# Patient Record
Sex: Female | Born: 1955 | Race: Black or African American | Hispanic: No | Marital: Single | Smoking: Current every day smoker
Health system: Southern US, Community
[De-identification: ages and names within clinical notes are randomized; demographics above are authoritative.]

## PROBLEM LIST (undated history)

## (undated) DIAGNOSIS — I10 Essential (primary) hypertension: Secondary | ICD-10-CM

## (undated) DIAGNOSIS — I509 Heart failure, unspecified: Secondary | ICD-10-CM

## (undated) DIAGNOSIS — N289 Disorder of kidney and ureter, unspecified: Secondary | ICD-10-CM

## (undated) DIAGNOSIS — E785 Hyperlipidemia, unspecified: Secondary | ICD-10-CM

## (undated) DIAGNOSIS — U071 COVID-19: Secondary | ICD-10-CM

## (undated) HISTORY — DX: Essential (primary) hypertension: I10

---

## 2004-07-10 ENCOUNTER — Emergency Department: Payer: Self-pay | Admitting: Emergency Medicine

## 2007-03-05 ENCOUNTER — Ambulatory Visit: Payer: Self-pay

## 2009-08-07 ENCOUNTER — Inpatient Hospital Stay: Payer: Self-pay | Admitting: Internal Medicine

## 2010-01-05 ENCOUNTER — Ambulatory Visit: Payer: Self-pay | Admitting: General Practice

## 2012-06-08 ENCOUNTER — Emergency Department: Payer: Self-pay | Admitting: Emergency Medicine

## 2012-06-08 LAB — DRUG SCREEN, URINE
Amphetamines, Ur Screen: NEGATIVE (ref ?–1000)
Barbiturates, Ur Screen: NEGATIVE (ref ?–200)
Cannabinoid 50 Ng, Ur ~~LOC~~: NEGATIVE (ref ?–50)
Cocaine Metabolite,Ur ~~LOC~~: POSITIVE (ref ?–300)
MDMA (Ecstasy)Ur Screen: NEGATIVE (ref ?–500)
Phencyclidine (PCP) Ur S: NEGATIVE (ref ?–25)
Tricyclic, Ur Screen: NEGATIVE (ref ?–1000)

## 2012-06-08 LAB — COMPREHENSIVE METABOLIC PANEL
Alkaline Phosphatase: 146 U/L — ABNORMAL HIGH (ref 50–136)
BUN: 15 mg/dL (ref 7–18)
Bilirubin,Total: 0.5 mg/dL (ref 0.2–1.0)
Co2: 26 mmol/L (ref 21–32)
Creatinine: 1.54 mg/dL — ABNORMAL HIGH (ref 0.60–1.30)
EGFR (African American): 43 — ABNORMAL LOW
EGFR (Non-African Amer.): 37 — ABNORMAL LOW
Glucose: 83 mg/dL (ref 65–99)
Potassium: 3.5 mmol/L (ref 3.5–5.1)
SGOT(AST): 31 U/L (ref 15–37)
SGPT (ALT): 21 U/L (ref 12–78)
Total Protein: 8.1 g/dL (ref 6.4–8.2)

## 2012-06-08 LAB — ACETAMINOPHEN LEVEL: Acetaminophen: <2 ug/mL

## 2012-06-08 LAB — ETHANOL: Ethanol %: <0.003 % (ref 0.000–0.080)

## 2012-06-08 LAB — CBC
HCT: 43.4 % (ref 35.0–47.0)
HGB: 14.2 g/dL (ref 12.0–16.0)
MCH: 27 pg (ref 26.0–34.0)
RBC: 5.26 10*6/uL — ABNORMAL HIGH (ref 3.80–5.20)
RDW: 14.8 % — ABNORMAL HIGH (ref 11.5–14.5)

## 2012-06-08 LAB — SALICYLATE LEVEL: Salicylates, Serum: 3.7 mg/dL — ABNORMAL HIGH

## 2013-11-06 ENCOUNTER — Ambulatory Visit: Payer: Self-pay | Admitting: Gastroenterology

## 2013-12-22 ENCOUNTER — Telehealth: Payer: Self-pay | Admitting: *Deleted

## 2013-12-22 NOTE — Telephone Encounter (Signed)
Pt called states she received a referral from her doctor, please call her for the information.

## 2014-01-05 ENCOUNTER — Ambulatory Visit (INDEPENDENT_AMBULATORY_CARE_PROVIDER_SITE_OTHER): Payer: Medicaid Other

## 2014-01-05 ENCOUNTER — Ambulatory Visit (INDEPENDENT_AMBULATORY_CARE_PROVIDER_SITE_OTHER): Payer: Medicaid Other | Admitting: Podiatry

## 2014-01-05 ENCOUNTER — Encounter: Payer: Self-pay | Admitting: Podiatry

## 2014-01-05 VITALS — BP 127/92 | HR 64 | Resp 16 | Ht 67.0 in | Wt 220.0 lb

## 2014-01-05 DIAGNOSIS — M2040 Other hammer toe(s) (acquired), unspecified foot: Secondary | ICD-10-CM

## 2014-01-05 DIAGNOSIS — L600 Ingrowing nail: Secondary | ICD-10-CM

## 2014-01-05 DIAGNOSIS — B351 Tinea unguium: Secondary | ICD-10-CM

## 2014-01-05 DIAGNOSIS — M79675 Pain in left toe(s): Secondary | ICD-10-CM

## 2014-01-05 DIAGNOSIS — M779 Enthesopathy, unspecified: Secondary | ICD-10-CM

## 2014-01-05 DIAGNOSIS — M79674 Pain in right toe(s): Secondary | ICD-10-CM

## 2014-01-05 NOTE — Progress Notes (Signed)
   Subjective:    Patient ID: Katelyn Lane, female    DOB: October 17, 1955, 58 y.o.   MRN: [id number]  HPI Comments: 58 year old female presents the office with complaints of big toe nail ingrown toenails. She states that the left toenail has come off a couple of times but has since regrown. She states that over the weekend she took a knife and cut out the ingrown portion of the toenail on both of her toes. She denies any recent redness or drainage from the nail sites. She states that she continues have some mild discomfort along the end of the toenail. She states that her right second digit is numb intermittently and will periodically throb and swell. She states his his been ongoing for several months. She denies any history of injury or trauma to the area. No prior treatment for this. No other complaints at this time.     Review of Systems  Constitutional: Positive for unexpected weight change.       Sweating  HENT: Positive for sinus pressure.   Respiratory: Positive for cough.   Skin: Positive for rash.  Neurological: Positive for dizziness.  Hematological: Bruises/bleeds easily.  Psychiatric/Behavioral: Positive for behavioral problems.  All other systems reviewed and are negative.      Objective:   Physical Exam AAO 3, NAD DP/PT pulses palpable bilaterally, CRT less than 3 seconds Protective sensation intact with Simms Weinstein monofilament, vibratory sensation intact, Achilles tendon reflex intact. Hammertoe contractures present bilaterally with corns overlying the PIPJ and DIPJ mostly on the right second digit. There is no tenderness to palpation or pain with vibratory sensation overlying the right second digit or the remaining digits/metatarsals bilaterally. No overlying edema, erythema, increase in warmth. Bilateral hallux nails with slight incurvation of both the medial and lateral nail borders. There is no swelling erythema, edema, increase in warmth. No drainage or purulence  identified. Mild tenderness to palpation over the distal aspect of the medial and lateral nail borders bilaterally. No pain on the proximal nail borders. Bilateral hallux second digit nails hypertrophic, dystrophic, brittle, discolored. No open lesions or pre-ulcerative lesions No pain with calf compression, swelling, warmth, erythema. No areas of pinpoint bony tenderness or pain with vibratory sensation. MMT 5/5, ROM WNL       Assessment & Plan:  58 year old female with bilateral hallux medial and lateral ingrown toenail, symptomatic; likely onychomycosis; hammertoe contractures -X-rays were obtained and reviewed with the patient. -Treatment options were discussed including alternatives, risks, complications. -Debrided bilateral hallux and second digit toenails. Ingrowing portion on the hallux nails were sharply debrided without complications to patient comfort. Discussed with patient if the area becomes symptomatic again or if there is any signs or symptoms of infection with likely need to perform a partial nail avulsion. Patient wishes to hold off at this time. -Right second digit intermittent pain likely due to significant hammertoe contracture this digit. Continue to monitor. Dispensed offloading pad to help protect the area. -Patient was inquiring about possible onychomycosis treatment. Discussed various treatment options. At this time, we'll proceed with fungi nail. -Follow-up as needed. In the meantime, call the office in the questions, concerns, change in symptoms. Follow-up with PCP for other issues mentioned in the review of systems.

## 2014-01-05 NOTE — Patient Instructions (Addendum)
Onychomycosis/Fungal Toenails  WHAT IS IT? An infection that lies within the keratin of your nail plate that is caused by a fungus.  WHY ME? Fungal infections affect all ages, sexes, races, and creeds.  There may be many factors that predispose you to a fungal infection such as age, coexisting medical conditions such as diabetes, or an autoimmune disease; stress, medications, fatigue, genetics, etc.  Bottom line: fungus thrives in a warm, moist environment and your shoes offer such a location.  IS IT CONTAGIOUS? Theoretically, yes.  You do not want to share shoes, nail clippers or files with someone who has fungal toenails.  Walking around barefoot in the same room or sleeping in the same bed is unlikely to transfer the organism.  It is important to realize, however, that fungus can spread easily from one nail to the next on the same foot.  HOW DO WE TREAT THIS?  There are several ways to treat this condition.  Treatment may depend on many factors such as age, medications, pregnancy, liver and kidney conditions, etc.  It is best to ask your doctor which options are available to you.  1. No treatment.   Unlike many other medical concerns, you can live with this condition.  However for many people this can be a painful condition and may lead to ingrown toenails or a bacterial infection.  It is recommended that you keep the nails cut short to help reduce the amount of fungal nail. 2. Topical treatment.  These range from herbal remedies to prescription strength nail lacquers.  About 40-50% effective, topicals require twice daily application for approximately 9 to 12 months or until an entirely new nail has grown out.  The most effective topicals are medical grade medications available through physicians offices. 3. Oral antifungal medications.  With an 80-90% cure rate, the most common oral medication requires 3 to 4 months of therapy and stays in your system for a year as the new nail grows out.  Oral  antifungal medications do require blood work to make sure it is a safe drug for you.  A liver function panel will be performed prior to starting the medication and after the first month of treatment.  It is important to have the blood work performed to avoid any harmful side effects.  In general, this medication safe but blood work is required. 4. Laser Therapy.  This treatment is performed by applying a specialized laser to the affected nail plate.  This therapy is noninvasive, fast, and non-painful.  It is not covered by insurance and is therefore, out of pocket.  The results have been very good with a 80-95% cure rate.  [facility] is the only practice in the area to offer this therapy. Permanent Nail Avulsion.  Removing the entire nail so that a new nail will not grow back. Over the counter treatment for nail fungus is called fungi-nail Ingrown Toenail An ingrown toenail occurs when the sharp edge of a toenail grows into the skin of the groove beside the nail (lateral nail groove), causing pain. Ingrown toenails most commonly occur on the first (big) toe. If left untreated, an ingrown toenail may become inflamed and infected. The infection can even spread throughout the toe. SYMPTOMS  Pain, centered on the nail groove. Tenderness and inflammation. Pus drainage (occasionally). Signs of infection: redness, swelling, pain, warm to the touch. CAUSES  Ingrown toenails are caused when the toenail grows into the neighboring fleshy nail fold. This may be the  result of poor nail trimming or pressure from shoes.  RISK INCREASES WITH:  Curved nail formations. Clipping toenails too far on the sides, which allows tissue to grow over the nail. Poorly fitted shoes, especially those that press down on the toenails. Sports that require sudden stops, causing the toe to jam into the shoe. PREVENTION  Trim toenails properly, making sure the sides are not clipped too short. Wear properly fitted  shoes. Avoid excessive pressure on the toenails. PROGNOSIS  Ingrown toenails are usually curable within 1 week, depending on the presence or absence of an infection.  RELATED COMPLICATIONS  Chronic infection, that cannot be cured without surgery. Spread of infection throughout the toe, or even the bone. TREATMENT  Treatment first involves resting from aggravating activities, wearing shoes that do not place pressure on the toenails, antibiotics (if infected), and soaking the foot in warm water (with or without Epsom salt). After the foot has been soaked, you may be able to gently lift the nail from the fleshy tissue, and place a bit of cotton ball under the nail, easing the pressure. If you have been prescribed antibiotics, expect relief to begin up to 48 hours after taking the medicine. Symptoms usually go away within 1 week. For people who suffer from persistent (chronic) ingrown toenails, surgery may be advised. MEDICATION  If pain medicine is needed, nonsteroidal anti-inflammatory medicines (aspirin and ibuprofen), or other minor pain relievers (acetaminophen), are often advised. Do not take pain medicine for 7 days before surgery. Stronger pain relievers may be prescribed, if your caregiver thinks they are needed. Use only as directed and only as much as you need. Antibiotics may be prescribed to fight infection. Take as directed by your caregiver. Finish the entire prescription, even if you start to feel better before you finish it. SOAKS AND COLD THERAPY  Soak the toe (or whole foot) for 20 minutes, twice a day, in a gallon of warm water. You may add 2 tablespoons of Epsom salts or a mild detergent. Cold treatment (icing) should be applied for 10 to 15 minutes every 2 to 3 hours for inflammation and pain, and immediately after activity that aggravates your symptoms. Use ice packs or an ice massage. SEEK MEDICAL CARE IF:  Symptoms get worse or do not improve in 48 hours, despite  treatment. You develop fever, increased pain and swelling, or signs of infection (pain, redness, tenderness, swelling, warmth) in the toe, after surgery. New, unexplained symptoms develop. (Drugs used in treatment may produce side effects.) Document Released: 12/25/2004 Document Revised: 03/19/2011 Document Reviewed: 04/08/2008 [facility] Patient Information 2015 [place], [place]. This information is not intended to replace advice given to you by your health care provider. Make sure you discuss any questions you have with your health care provider. 5.

## 2015-06-30 ENCOUNTER — Ambulatory Visit: Payer: Medicaid Other | Attending: Family Medicine | Admitting: Physical Therapy

## 2015-08-12 ENCOUNTER — Other Ambulatory Visit: Payer: Self-pay | Admitting: Family Medicine

## 2015-08-12 DIAGNOSIS — Z1231 Encounter for screening mammogram for malignant neoplasm of breast: Secondary | ICD-10-CM

## 2015-09-02 ENCOUNTER — Ambulatory Visit
Admission: RE | Admit: 2015-09-02 | Discharge: 2015-09-02 | Disposition: A | Discharge status: Home / Self Care | Payer: Medicaid Other | Source: Ambulatory Visit | Attending: Family Medicine | Admitting: Family Medicine

## 2015-09-02 ENCOUNTER — Encounter (HOSPITAL_COMMUNITY): Payer: Self-pay

## 2015-09-02 DIAGNOSIS — Z1231 Encounter for screening mammogram for malignant neoplasm of breast: Secondary | ICD-10-CM | POA: Diagnosis not present

## 2016-02-20 ENCOUNTER — Other Ambulatory Visit: Payer: Self-pay | Admitting: Family Medicine

## 2016-02-20 ENCOUNTER — Ambulatory Visit
Admission: RE | Admit: 2016-02-20 | Discharge: 2016-02-20 | Disposition: A | Discharge status: Home / Self Care | Payer: Medicaid Other | Source: Ambulatory Visit | Attending: Family Medicine | Admitting: Family Medicine

## 2016-02-20 DIAGNOSIS — G8929 Other chronic pain: Secondary | ICD-10-CM | POA: Diagnosis not present

## 2016-02-20 DIAGNOSIS — M25511 Pain in right shoulder: Secondary | ICD-10-CM | POA: Diagnosis present

## 2016-10-04 ENCOUNTER — Other Ambulatory Visit: Payer: Self-pay | Admitting: Family Medicine

## 2016-10-04 DIAGNOSIS — L299 Pruritus, unspecified: Secondary | ICD-10-CM

## 2016-10-18 ENCOUNTER — Other Ambulatory Visit
Admission: RE | Admit: 2016-10-18 | Discharge: 2016-10-18 | Disposition: A | Discharge status: Home / Self Care | Payer: Medicaid Other | Source: Ambulatory Visit | Attending: Family Medicine | Admitting: Family Medicine

## 2016-10-18 ENCOUNTER — Ambulatory Visit
Admission: RE | Admit: 2016-10-18 | Discharge: 2016-10-18 | Disposition: A | Discharge status: Home / Self Care | Payer: Medicaid Other | Source: Ambulatory Visit | Attending: Family Medicine | Admitting: Family Medicine

## 2016-10-18 DIAGNOSIS — J449 Chronic obstructive pulmonary disease, unspecified: Secondary | ICD-10-CM | POA: Diagnosis not present

## 2016-10-18 DIAGNOSIS — J9811 Atelectasis: Secondary | ICD-10-CM | POA: Insufficient documentation

## 2016-10-18 DIAGNOSIS — L299 Pruritus, unspecified: Secondary | ICD-10-CM | POA: Diagnosis present

## 2016-10-18 DIAGNOSIS — I517 Cardiomegaly: Secondary | ICD-10-CM | POA: Insufficient documentation

## 2017-06-18 ENCOUNTER — Other Ambulatory Visit: Payer: Self-pay | Admitting: Family Medicine

## 2017-06-18 DIAGNOSIS — Z1231 Encounter for screening mammogram for malignant neoplasm of breast: Secondary | ICD-10-CM

## 2017-08-01 ENCOUNTER — Ambulatory Visit
Admission: RE | Admit: 2017-08-01 | Discharge: 2017-08-01 | Disposition: A | Discharge status: Home / Self Care | Payer: Medicaid Other | Source: Ambulatory Visit | Attending: Family Medicine | Admitting: Family Medicine

## 2017-08-01 DIAGNOSIS — Z1231 Encounter for screening mammogram for malignant neoplasm of breast: Secondary | ICD-10-CM

## 2018-03-06 ENCOUNTER — Other Ambulatory Visit: Payer: Self-pay | Admitting: Nephrology

## 2018-03-06 DIAGNOSIS — N183 Chronic kidney disease, stage 3 unspecified: Secondary | ICD-10-CM

## 2018-03-25 ENCOUNTER — Other Ambulatory Visit: Payer: Self-pay

## 2018-03-25 ENCOUNTER — Ambulatory Visit
Admission: RE | Admit: 2018-03-25 | Discharge: 2018-03-25 | Disposition: A | Discharge status: Home / Self Care | Payer: Medicaid Other | Source: Ambulatory Visit | Attending: Nephrology | Admitting: Nephrology

## 2018-03-25 DIAGNOSIS — N183 Chronic kidney disease, stage 3 unspecified: Secondary | ICD-10-CM

## 2018-09-11 ENCOUNTER — Other Ambulatory Visit: Payer: Self-pay | Admitting: Family Medicine

## 2018-12-29 ENCOUNTER — Other Ambulatory Visit: Payer: Self-pay

## 2018-12-29 ENCOUNTER — Inpatient Hospital Stay (HOSPITAL_COMMUNITY)
Admit: 2018-12-29 | Discharge: 2018-12-29 | Disposition: A | Discharge status: Home / Self Care | Payer: Medicaid Other | Attending: Family Medicine | Admitting: Family Medicine

## 2018-12-29 ENCOUNTER — Emergency Department: Payer: Medicaid Other

## 2018-12-29 ENCOUNTER — Inpatient Hospital Stay
Admission: EM | Admit: 2018-12-29 | Discharge: 2019-01-01 | DRG: 286 | Disposition: A | Discharge status: Home / Self Care | Payer: Medicaid Other | Attending: Internal Medicine | Admitting: Internal Medicine

## 2018-12-29 DIAGNOSIS — Z79891 Long term (current) use of opiate analgesic: Secondary | ICD-10-CM | POA: Diagnosis not present

## 2018-12-29 DIAGNOSIS — F17210 Nicotine dependence, cigarettes, uncomplicated: Secondary | ICD-10-CM | POA: Diagnosis present

## 2018-12-29 DIAGNOSIS — Z880 Allergy status to penicillin: Secondary | ICD-10-CM

## 2018-12-29 DIAGNOSIS — Z20828 Contact with and (suspected) exposure to other viral communicable diseases: Secondary | ICD-10-CM | POA: Diagnosis present

## 2018-12-29 DIAGNOSIS — F329 Major depressive disorder, single episode, unspecified: Secondary | ICD-10-CM | POA: Diagnosis present

## 2018-12-29 DIAGNOSIS — R0602 Shortness of breath: Secondary | ICD-10-CM

## 2018-12-29 DIAGNOSIS — I420 Dilated cardiomyopathy: Secondary | ICD-10-CM | POA: Diagnosis present

## 2018-12-29 DIAGNOSIS — I248 Other forms of acute ischemic heart disease: Secondary | ICD-10-CM | POA: Diagnosis present

## 2018-12-29 DIAGNOSIS — E876 Hypokalemia: Secondary | ICD-10-CM | POA: Diagnosis present

## 2018-12-29 DIAGNOSIS — F32A Depression, unspecified: Secondary | ICD-10-CM | POA: Diagnosis present

## 2018-12-29 DIAGNOSIS — I5021 Acute systolic (congestive) heart failure: Secondary | ICD-10-CM

## 2018-12-29 DIAGNOSIS — I5031 Acute diastolic (congestive) heart failure: Secondary | ICD-10-CM

## 2018-12-29 DIAGNOSIS — I5020 Unspecified systolic (congestive) heart failure: Secondary | ICD-10-CM

## 2018-12-29 DIAGNOSIS — R0789 Other chest pain: Secondary | ICD-10-CM | POA: Diagnosis present

## 2018-12-29 DIAGNOSIS — I160 Hypertensive urgency: Secondary | ICD-10-CM | POA: Diagnosis present

## 2018-12-29 DIAGNOSIS — I5041 Acute combined systolic (congestive) and diastolic (congestive) heart failure: Secondary | ICD-10-CM | POA: Diagnosis present

## 2018-12-29 DIAGNOSIS — I130 Hypertensive heart and chronic kidney disease with heart failure and stage 1 through stage 4 chronic kidney disease, or unspecified chronic kidney disease: Principal | ICD-10-CM | POA: Diagnosis present

## 2018-12-29 DIAGNOSIS — N189 Chronic kidney disease, unspecified: Secondary | ICD-10-CM | POA: Diagnosis not present

## 2018-12-29 DIAGNOSIS — E785 Hyperlipidemia, unspecified: Secondary | ICD-10-CM | POA: Diagnosis present

## 2018-12-29 DIAGNOSIS — N179 Acute kidney failure, unspecified: Secondary | ICD-10-CM | POA: Diagnosis present

## 2018-12-29 DIAGNOSIS — I509 Heart failure, unspecified: Secondary | ICD-10-CM

## 2018-12-29 DIAGNOSIS — Z79899 Other long term (current) drug therapy: Secondary | ICD-10-CM

## 2018-12-29 DIAGNOSIS — N1832 Chronic kidney disease, stage 3b: Secondary | ICD-10-CM | POA: Diagnosis present

## 2018-12-29 DIAGNOSIS — Z888 Allergy status to other drugs, medicaments and biological substances status: Secondary | ICD-10-CM

## 2018-12-29 HISTORY — DX: Hyperlipidemia, unspecified: E78.5

## 2018-12-29 HISTORY — DX: Disorder of kidney and ureter, unspecified: N28.9

## 2018-12-29 LAB — LACTIC ACID, PLASMA
Lactic Acid, Venous: 2 mmol/L (ref 0.5–1.9)
Lactic Acid, Venous: 2 mmol/L (ref 0.5–1.9)

## 2018-12-29 LAB — BASIC METABOLIC PANEL
Anion gap: 6 (ref 5–15)
BUN: 37 mg/dL — ABNORMAL HIGH (ref 8–23)
CO2: 28 mmol/L (ref 22–32)
Calcium: 8.9 mg/dL (ref 8.9–10.3)
Chloride: 105 mmol/L (ref 98–111)
Creatinine, Ser: 1.74 mg/dL — ABNORMAL HIGH (ref 0.44–1.00)
GFR calc Af Amer: 36 mL/min — ABNORMAL LOW (ref >60)
GFR calc non Af Amer: 31 mL/min — ABNORMAL LOW (ref >60)
Glucose, Bld: 157 mg/dL — ABNORMAL HIGH (ref 70–99)
Potassium: 3 mmol/L — ABNORMAL LOW (ref 3.5–5.1)
Sodium: 139 mmol/L (ref 135–145)

## 2018-12-29 LAB — CBC WITH DIFFERENTIAL/PLATELET
Abs Immature Granulocytes: 0.02 10*3/uL (ref 0.00–0.07)
Basophils Absolute: 0.1 10*3/uL (ref 0.0–0.1)
Basophils Relative: 1 %
Eosinophils Absolute: 0.4 10*3/uL (ref 0.0–0.5)
Eosinophils Relative: 5 %
HCT: 42.2 % (ref 36.0–46.0)
Hemoglobin: 12.8 g/dL (ref 12.0–15.0)
Immature Granulocytes: 0 %
Lymphocytes Relative: 27 %
Lymphs Abs: 2 10*3/uL (ref 0.7–4.0)
MCH: 25.8 pg — ABNORMAL LOW (ref 26.0–34.0)
MCHC: 30.3 g/dL (ref 30.0–36.0)
MCV: 84.9 fL (ref 80.0–100.0)
Monocytes Absolute: 0.6 10*3/uL (ref 0.1–1.0)
Monocytes Relative: 8 %
Neutro Abs: 4.3 10*3/uL (ref 1.7–7.7)
Neutrophils Relative %: 59 %
Platelets: 292 10*3/uL (ref 150–400)
RBC: 4.97 MIL/uL (ref 3.87–5.11)
RDW: 15.2 % (ref 11.5–15.5)
WBC: 7.3 10*3/uL (ref 4.0–10.5)
nRBC: 0 % (ref 0.0–0.2)

## 2018-12-29 LAB — ECHOCARDIOGRAM COMPLETE
Height: 67 in
Weight: 3600 oz

## 2018-12-29 LAB — TROPONIN I (HIGH SENSITIVITY)
Troponin I (High Sensitivity): 53 ng/L — ABNORMAL HIGH (ref <18)
Troponin I (High Sensitivity): 55 ng/L — ABNORMAL HIGH (ref <18)
Troponin I (High Sensitivity): 50 ng/L — ABNORMAL HIGH (ref <18)

## 2018-12-29 LAB — BRAIN NATRIURETIC PEPTIDE: B Natriuretic Peptide: 767 pg/mL — ABNORMAL HIGH (ref 0.0–100.0)

## 2018-12-29 LAB — SARS CORONAVIRUS 2 (TAT 6-24 HRS): SARS Coronavirus 2: NEGATIVE

## 2018-12-29 MED ORDER — HYDRALAZINE HCL 50 MG PO TABS
50.0000 mg | ORAL_TABLET | Freq: Four times a day (QID) | ORAL | Status: DC | PRN
  Filled 2018-12-29: qty 1

## 2018-12-29 MED ORDER — METHYLPREDNISOLONE SODIUM SUCC 125 MG IJ SOLR
125.0000 mg | Freq: Once | INTRAMUSCULAR | Status: AC
  Administered 2018-12-29: 125 mg via INTRAVENOUS
  Filled 2018-12-29: qty 2

## 2018-12-29 MED ORDER — LABETALOL HCL 5 MG/ML IV SOLN
20.0000 mg | INTRAVENOUS | Status: DC | PRN
  Filled 2018-12-29: qty 4

## 2018-12-29 MED ORDER — NITROGLYCERIN 0.4 MG/SPRAY TL SOLN: 1.0000 | Status: DC | PRN

## 2018-12-29 MED ORDER — SODIUM CHLORIDE 0.9 % IV SOLN: 250.0000 mL | INTRAVENOUS | Status: DC | PRN

## 2018-12-29 MED ORDER — NITROGLYCERIN 0.4 MG SL SUBL
SUBLINGUAL_TABLET | SUBLINGUAL | Status: AC
  Administered 2018-12-29: 0.4 mg via SUBLINGUAL
  Filled 2018-12-29: qty 3

## 2018-12-29 MED ORDER — ALPRAZOLAM 0.5 MG PO TABS: 0.2500 mg | ORAL_TABLET | Freq: Two times a day (BID) | ORAL | Status: DC | PRN

## 2018-12-29 MED ORDER — ASPIRIN EC 81 MG PO TBEC
81.0000 mg | DELAYED_RELEASE_TABLET | Freq: Every day | ORAL | Status: DC
  Administered 2018-12-29 – 2019-01-01 (×4): 81 mg via ORAL
  Filled 2018-12-29 (×4): qty 1

## 2018-12-29 MED ORDER — DIPHENHYDRAMINE HCL 25 MG PO CAPS: 25.0000 mg | ORAL_CAPSULE | Freq: Four times a day (QID) | ORAL | Status: DC | PRN

## 2018-12-29 MED ORDER — SODIUM CHLORIDE 0.9% FLUSH
3.0000 mL | INTRAVENOUS | Status: DC | PRN
  Administered 2019-01-01: 3 mL via INTRAVENOUS

## 2018-12-29 MED ORDER — FUROSEMIDE 10 MG/ML IJ SOLN: 40.0000 mg | Freq: Two times a day (BID) | INTRAMUSCULAR | Status: DC

## 2018-12-29 MED ORDER — NITROGLYCERIN 0.4 MG SL SUBL
0.4000 mg | SUBLINGUAL_TABLET | SUBLINGUAL | Status: AC | PRN
  Administered 2018-12-29 (×2): 0.4 mg via SUBLINGUAL

## 2018-12-29 MED ORDER — SODIUM CHLORIDE 0.9% FLUSH
3.0000 mL | Freq: Two times a day (BID) | INTRAVENOUS | Status: DC
  Administered 2018-12-29 – 2018-12-31 (×6): 3 mL via INTRAVENOUS

## 2018-12-29 MED ORDER — FUROSEMIDE 10 MG/ML IJ SOLN
40.0000 mg | Freq: Once | INTRAMUSCULAR | Status: AC
  Administered 2018-12-29: 02:00:00 40 mg via INTRAVENOUS
  Filled 2018-12-29: qty 4

## 2018-12-29 MED ORDER — AMLODIPINE BESYLATE 10 MG PO TABS
10.0000 mg | ORAL_TABLET | Freq: Every day | ORAL | Status: DC
  Administered 2018-12-29 – 2018-12-30 (×2): 10 mg via ORAL
  Filled 2018-12-29: qty 1
  Filled 2018-12-29: qty 2

## 2018-12-29 MED ORDER — ENOXAPARIN SODIUM 40 MG/0.4ML ~~LOC~~ SOLN
40.0000 mg | SUBCUTANEOUS | Status: DC
  Administered 2018-12-29 – 2018-12-30 (×2): 40 mg via SUBCUTANEOUS
  Filled 2018-12-29 (×3): qty 0.4

## 2018-12-29 MED ORDER — MORPHINE SULFATE (PF) 2 MG/ML IV SOLN
2.0000 mg | INTRAVENOUS | Status: DC | PRN
  Administered 2018-12-31 – 2019-01-01 (×2): 2 mg via INTRAVENOUS
  Filled 2018-12-29 (×2): qty 1

## 2018-12-29 MED ORDER — ATORVASTATIN CALCIUM 20 MG PO TABS
20.0000 mg | ORAL_TABLET | Freq: Every day | ORAL | Status: DC
  Administered 2018-12-29 – 2018-12-31 (×3): 20 mg via ORAL
  Filled 2018-12-29 (×3): qty 1

## 2018-12-29 MED ORDER — ASPIRIN EC 325 MG PO TBEC: 325.0000 mg | DELAYED_RELEASE_TABLET | Freq: Every day | ORAL | Status: DC

## 2018-12-29 MED ORDER — ONDANSETRON HCL 4 MG/2ML IJ SOLN: 4.0000 mg | Freq: Four times a day (QID) | INTRAMUSCULAR | Status: DC | PRN

## 2018-12-29 MED ORDER — ALBUTEROL SULFATE HFA 108 (90 BASE) MCG/ACT IN AERS
4.0000 | INHALATION_SPRAY | Freq: Once | RESPIRATORY_TRACT | Status: AC
  Administered 2018-12-29: 01:00:00 4 via RESPIRATORY_TRACT
  Filled 2018-12-29: qty 6.7

## 2018-12-29 MED ORDER — ACETAMINOPHEN 325 MG PO TABS
650.0000 mg | ORAL_TABLET | ORAL | Status: DC | PRN
  Administered 2018-12-31 – 2019-01-01 (×2): 650 mg via ORAL
  Filled 2018-12-29 (×2): qty 2

## 2018-12-29 MED ORDER — FUROSEMIDE 10 MG/ML IJ SOLN
40.0000 mg | Freq: Two times a day (BID) | INTRAMUSCULAR | Status: DC
  Administered 2018-12-29 – 2019-01-01 (×7): 40 mg via INTRAVENOUS
  Filled 2018-12-29 (×9): qty 4

## 2018-12-29 MED ORDER — FLUOXETINE HCL 20 MG PO CAPS
40.0000 mg | ORAL_CAPSULE | Freq: Every day | ORAL | Status: DC
  Administered 2018-12-29 – 2019-01-01 (×4): 40 mg via ORAL
  Filled 2018-12-29 (×4): qty 2

## 2018-12-29 MED ORDER — TRAZODONE HCL 50 MG PO TABS
150.0000 mg | ORAL_TABLET | Freq: Every evening | ORAL | Status: DC | PRN
  Filled 2018-12-29: qty 1

## 2018-12-29 MED ORDER — NITROGLYCERIN 0.4 MG SL SUBL: 0.4000 mg | SUBLINGUAL_TABLET | SUBLINGUAL | Status: DC | PRN

## 2018-12-29 MED ORDER — LORATADINE 10 MG PO TABS
10.0000 mg | ORAL_TABLET | Freq: Every day | ORAL | Status: DC
  Administered 2018-12-30 – 2019-01-01 (×3): 10 mg via ORAL
  Filled 2018-12-29 (×5): qty 1

## 2018-12-29 MED ORDER — POTASSIUM CHLORIDE CRYS ER 20 MEQ PO TBCR
40.0000 meq | EXTENDED_RELEASE_TABLET | ORAL | Status: AC
  Administered 2018-12-29 (×2): 40 meq via ORAL
  Filled 2018-12-29 (×2): qty 2

## 2018-12-29 MED ORDER — METOPROLOL TARTRATE 25 MG PO TABS
25.0000 mg | ORAL_TABLET | Freq: Two times a day (BID) | ORAL | Status: DC
  Administered 2018-12-29 – 2018-12-30 (×3): 25 mg via ORAL
  Filled 2018-12-29: qty 0.5
  Filled 2018-12-29 (×3): qty 1

## 2018-12-29 MED ORDER — ZOLPIDEM TARTRATE 5 MG PO TABS: 5.0000 mg | ORAL_TABLET | Freq: Every evening | ORAL | Status: DC | PRN

## 2018-12-29 MED ORDER — ALPRAZOLAM 0.5 MG PO TABS
0.5000 mg | ORAL_TABLET | Freq: Three times a day (TID) | ORAL | Status: DC | PRN
  Administered 2018-12-29 – 2019-01-01 (×4): 0.5 mg via ORAL
  Filled 2018-12-29: qty 2
  Filled 2018-12-29 (×2): qty 1

## 2018-12-29 NOTE — ED Notes (Signed)
meds not verified, msg'd to pharm

## 2018-12-29 NOTE — Consult Note (Signed)
[facility] Consultation Note  Patient ID: Katelyn Lane, MRN: [id number], DOB/AGE: 1955-10-20 63 y.o. Admit date: 12/29/2018   Date of Consult: 12/29/2018 Primary Physician: [name], MD Primary Cardiologist: None  Chief Complaint:  Chief Complaint  Patient presents with  . Shortness of Breath   Reason for Consult: Heart failure  HPI: 63 y.o. female with known hypertension hyperlipidemia who has had new onset of shortness of breath weakness fatigue and PND and orthopnea over the last several days.  She felt like her breathing was significantly abnormal and was unable to continue on.  She was seen in the emergency room for which a chest x-ray has shown that she has some interstitial edema.  She does have a BNP of 767 and a troponin of 53.  She did have chronic kidney disease with a glomerular filtration rate of 36.  With admission to the hospital the patient has had intravenous Lasix which is dramatically helped her shortness of breath.  Additionally inhaler helped as well.  Patient has not had any anginal symptoms or symptoms suggestive of myocardial infarction at this time.  Currently she is hemodynamically stable  Past Medical History:  Diagnosis Date  . Hyperlipidemia   . Renal disorder       Surgical History: No past surgical history on file.   Home Meds: Prior to Admission medications   Medication Sig Start Date End Date Taking? Authorizing Provider  amLODipine (NORVASC) 10 MG tablet Take 10 mg by mouth daily.  03/31/14 11/03/19 Yes [provider]  atorvastatin (LIPITOR) 20 MG tablet Take 20 mg by mouth daily at 6 PM.    Yes [provider]  FLUoxetine (PROZAC) 40 MG capsule Take 40 mg by mouth daily.    Yes [provider]  hydrochlorothiazide (HYDRODIURIL) 25 MG tablet Take 25 mg by mouth daily.    Yes [provider]  traZODone (DESYREL) 100 MG tablet Take 150 mg by mouth at bedtime as needed. 09/08/13  Yes  [provider]    Inpatient Medications:  . amLODipine  10 mg Oral Daily  . aspirin EC  81 mg Oral Daily  . atorvastatin  20 mg Oral q1800  . enoxaparin (LOVENOX) injection  40 mg Subcutaneous Q24H  . FLUoxetine  40 mg Oral Daily  . furosemide  40 mg Intravenous Q12H  . sodium chloride flush  3 mL Intravenous Q12H   . sodium chloride      Allergies:  Allergies  Allergen Reactions  . Penicillins Hives, Other (See Comments) and Rash  . Ace Inhibitors Nausea And Vomiting    Social History   Socioeconomic History  . Marital status: Single    Spouse name: Not on file  . Number of children: Not on file  . Years of education: Not on file  . Highest education level: Not on file  Occupational History  . Not on file  Tobacco Use  . Smoking status: Current Every Day Smoker  . Smokeless tobacco: Never Used  Substance and Sexual Activity  . Alcohol use: No    Alcohol/week: 0.0 standard drinks  . Drug use: Not on file  . Sexual activity: Not on file  Other Topics Concern  . Not on file  Social History Narrative  . Not on file   Social Determinants of Health   Financial Resource Strain:   . Difficulty of Paying Living Expenses: Not on file  Food Insecurity:   . Worried About Charity fundraiser in  the Last Year: Not on file  . Ran Out of Food in the Last Year: Not on file  Transportation Needs:   . Lack of Transportation (Medical): Not on file  . Lack of Transportation (Non-Medical): Not on file  Physical Activity:   . Days of Exercise per Week: Not on file  . Minutes of Exercise per Session: Not on file  Stress:   . Feeling of Stress : Not on file  Social Connections:   . Frequency of Communication with Friends and Family: Not on file  . Frequency of Social Gatherings with Friends and Family: Not on file  . Attends Religious Services: Not on file  . Active Member of Clubs or Organizations: Not on file  . Attends Archivist Meetings: Not on file   . Marital Status: Not on file  Intimate Partner Violence:   . Fear of Current or Ex-Partner: Not on file  . Emotionally Abused: Not on file  . Physically Abused: Not on file  . Sexually Abused: Not on file     Family History  Problem Relation Age of Onset  . Breast cancer Neg Hx      Review of Systems Positive for shortness of breath Negative for: General:  chills, fever, night sweats or weight changes.  Cardiovascular: Positive for PND orthopnea negative for syncope dizziness  Dermatological skin lesions rashes Respiratory: Cough congestion Urologic: Frequent urination urination at night and hematuria Abdominal: negative for nausea, vomiting, diarrhea, bright red blood per rectum, melena, or hematemesis Neurologic: negative for visual changes, and/or hearing changes  All other systems reviewed and are otherwise negative except as noted above.  Labs: No results for input(s): CKTOTAL, CKMB, TROPONINI in the last 72 hours. Lab Results  Component Value Date   WBC 7.3 12/29/2018   HGB 12.8 12/29/2018   HCT 42.2 12/29/2018   MCV 84.9 12/29/2018   PLT 292 12/29/2018    Recent Labs  Lab 12/29/18 0057  NA 139  K 3.0*  CL 105  CO2 28  BUN 37*  CREATININE 1.74*  CALCIUM 8.9  GLUCOSE 157*   No results found for: CHOL, HDL, LDLCALC, TRIG No results found for: DDIMER  Radiology/Studies:  DG Chest Portable 1 View  Result Date: 12/29/2018 CLINICAL DATA:  Shortness of breath EXAM: PORTABLE CHEST 1 VIEW COMPARISON:  10/18/2016 FINDINGS: Mild cardiomegaly. There is mild interstitial pulmonary edema. No focal consolidation. No pneumothorax or sizable pleural effusion. IMPRESSION: Cardiomegaly with mild interstitial pulmonary edema. Electronically Signed   By: Ulyses Jarred M.D.   On: 12/29/2018 01:27    EKG: Normal sinus rhythm with frequent preventricular contractions with left axis deviation and right bundle branch block  Weights: Filed Weights   12/29/18 0117  Weight:  102.1 kg     Physical Exam: Blood pressure (!) 134/93, pulse (!) 40, resp. rate (!) 23, height 5\' 7"  (1.702 m), weight 102.1 kg, SpO2 91 %. Body mass index is 35.24 kg/m. General: Well developed, well nourished, in no acute distress. Head eyes ears nose throat: Normocephalic, atraumatic, sclera non-icteric, no xanthomas, nares are without discharge. No apparent thyromegaly and/or mass  Lungs: Normal respiratory effort.  no wheezes, few basilar rales, no rhonchi.  Heart: RRR with normal S1 S2. no murmur gallop, no rub, PMI is normal size and placement, carotid upstroke normal without bruit, jugular venous pressure is normal Abdomen: Soft, non-tender, non-distended with normoactive bowel sounds. No hepatomegaly. No rebound/guarding. No obvious abdominal masses. Abdominal aorta is normal size without bruit  Extremities: Trace edema. no cyanosis, no clubbing, no ulcers  Peripheral : 2+ bilateral upper extremity pulses, 2+ bilateral femoral pulses, 2+ bilateral dorsal pedal pulse Neuro: Alert and oriented. No facial asymmetry. No focal deficit. Moves all extremities spontaneously. Musculoskeletal: Normal muscle tone without kyphosis Psych:  Responds to questions appropriately with a normal affect.    Assessment: 63 year old female with hypertension hyperlipidemia and new onset congestive heart failure with pulmonary edema and elevated troponin consistent with demand ischemia without evidence of myocardial infarction  Plan: 1.  Intravenous Lasix for pulmonary edema and acute systolic dysfunction congestive heart failure 2.  Continue hypertension control with current medical regimen without change 3.  Other inhalers for possible COPD 4.  Echocardiogram for LV systolic dysfunction valvular heart disease contributing to above 5.  Further treatment options after above  Signed, Corey Skains M.D. [facility] 12/29/2018, 8:23 AM

## 2018-12-29 NOTE — ED Provider Notes (Addendum)
[facility] Emergency Department Provider Note   ____________________________________________   First MD Initiated Contact with Patient 12/29/18 0040     (approximate)  I have reviewed the triage vital signs and the nursing notes.   HISTORY  Chief Complaint Shortness of Breath    HPI Katelyn Lane is a 63 y.o. female with past medical history of CKD and hyperlipidemia who presents to the ED complaining of shortness of breath.  History is limited due to patient's difficulty breathing as she is only able to speak single words at a time.  She reports feeling short of breath for the past couple of days.  She reports feeling feverish but has not taken her temperature.  She denies any associated pain in her chest.  She admits to smoking cigarettes, but denies any history of COPD, also denies any history of CHF.  She did not take anything for symptoms prior to arrival.  She is not aware of any sick contacts.        Past Medical History:  Diagnosis Date  . Hyperlipidemia   . Renal disorder     Patient Active Problem List   Diagnosis Date Noted  . Acute CHF (congestive heart failure) ([place]) 12/29/2018    No past surgical history on file.  Prior to Admission medications   Medication Sig Start Date End Date Taking? Authorizing Provider  amLODipine (NORVASC) 10 MG tablet Take 10 mg by mouth daily.  03/31/14 11/03/19 Yes [provider]  atorvastatin (LIPITOR) 20 MG tablet Take 20 mg by mouth daily at 6 PM.    Yes [provider]  FLUoxetine (PROZAC) 40 MG capsule Take 40 mg by mouth daily.    Yes [provider]  hydrochlorothiazide (HYDRODIURIL) 25 MG tablet Take 25 mg by mouth daily.    Yes [provider]  traZODone (DESYREL) 100 MG tablet Take 150 mg by mouth at bedtime as needed. 09/08/13  Yes [provider]    Allergies Penicillins and Ace inhibitors  Family History  Problem Relation Age of Onset  .  Breast cancer Neg Hx     Social History Social History   Tobacco Use  . Smoking status: Current Every Day Smoker  . Smokeless tobacco: Never Used  Substance Use Topics  . Alcohol use: No    Alcohol/week: 0.0 standard drinks  . Drug use: Not on file    Review of Systems  Constitutional: No fever/chills Eyes: No visual changes. ENT: No sore throat. Cardiovascular: Denies chest pain. Respiratory: Positive for shortness of breath. Gastrointestinal: No abdominal pain.  No nausea, no vomiting.  No diarrhea.  No constipation. Genitourinary: Negative for dysuria. Musculoskeletal: Negative for back pain. Skin: Negative for rash. Neurological: Negative for headaches, focal weakness or numbness.  ____________________________________________   PHYSICAL EXAM:  VITAL SIGNS: ED Triage Vitals [12/29/18 0040]  Enc Vitals Group     BP (!) 138/95     Pulse Rate 91     Resp (!) 26     Temp      Temp src      SpO2 94 %     Weight      Height      Head Circumference      Peak Flow      Pain Score      Pain Loc      Pain Edu?      Excl. in [place]?     Constitutional: Alert and oriented. Eyes: Conjunctivae are normal. Head:  Atraumatic. Nose: No congestion/rhinnorhea. Mouth/Throat: Mucous membranes are moist. Neck: Normal ROM Cardiovascular: Normal rate, regular rhythm. Grossly normal heart sounds. Respiratory: Tachypneic, only able to speak single words..  No retractions. Lungs with expiratory wheezing and crackles. Gastrointestinal: Soft and nontender. No distention. Genitourinary: deferred Musculoskeletal: No lower extremity tenderness nor edema. Neurologic:  Normal speech and language. No gross focal neurologic deficits are appreciated. Skin:  Skin is warm, dry and intact. No rash noted. Psychiatric: Mood and affect are normal. Speech and behavior are normal.  ____________________________________________   LABS (all labs ordered are listed, but only abnormal results are  displayed)  Labs Reviewed  BRAIN NATRIURETIC PEPTIDE - Abnormal; Notable for the following components:      Result Value   B Natriuretic Peptide 767.0 (*)    All other components within normal limits  BASIC METABOLIC PANEL - Abnormal; Notable for the following components:   Potassium 3.0 (*)    Glucose, Bld 157 (*)    BUN 37 (*)    Creatinine, Ser 1.74 (*)    GFR calc non Af Amer 31 (*)    GFR calc Af Amer 36 (*)    All other components within normal limits  CBC WITH DIFFERENTIAL/PLATELET - Abnormal; Notable for the following components:   MCH 25.8 (*)    All other components within normal limits  LACTIC ACID, PLASMA - Abnormal; Notable for the following components:   Lactic Acid, Venous 2.0 (*)    All other components within normal limits  LACTIC ACID, PLASMA - Abnormal; Notable for the following components:   Lactic Acid, Venous 2.0 (*)    All other components within normal limits  TROPONIN I (HIGH SENSITIVITY) - Abnormal; Notable for the following components:   Troponin I (High Sensitivity) 53 (*)    All other components within normal limits  TROPONIN I (HIGH SENSITIVITY) - Abnormal; Notable for the following components:   Troponin I (High Sensitivity) 55 (*)    All other components within normal limits  TROPONIN I (HIGH SENSITIVITY) - Abnormal; Notable for the following components:   Troponin I (High Sensitivity) 50 (*)    All other components within normal limits  SARS CORONAVIRUS 2 (TAT 6-24 HRS)  CBC WITH DIFFERENTIAL/PLATELET  POC SARS CORONAVIRUS 2 AG -  ED   ____________________________________________  EKG  ED ECG REPORT I, Blake Divine, the attending physician, personally viewed and interpreted this ECG.   Date: 12/29/2018  EKG Time: 00:43  Rate: 93  Rhythm: normal sinus rhythm, frequent PVC's noted  Axis: LAD  Intervals:nonspecific intraventricular conduction delay  ST&T Change: None  ED ECG REPORT I, Blake Divine, the attending physician,  personally viewed and interpreted this ECG.   Date: 12/29/2018  EKG Time: 00:48  Rate: 81  Rhythm: normal sinus rhythm  Axis: LAD  Intervals:nonspecific intraventricular conduction delay  ST&T Change: None    PROCEDURES  Procedure(s) performed (including Critical Care):  .Critical Care Performed by: Blake Divine, MD Authorized by: Blake Divine, MD   Critical care provider statement:    Critical care time (minutes):  45   Critical care was necessary to treat or prevent imminent or life-threatening deterioration of the following conditions:  Respiratory failure   Critical care was time spent personally by me on the following activities:  Discussions with consultants, evaluation of patient's response to treatment, examination of patient, ordering and performing treatments and interventions, ordering and review of laboratory studies, ordering and review of radiographic studies, pulse oximetry, re-evaluation of patient's condition, obtaining  history from patient or surrogate and review of old charts   I assumed direction of critical care for this patient from another provider in my specialty: no       ____________________________________________   INITIAL IMPRESSION / ASSESSMENT AND PLAN / ED COURSE       63 year old female presents to the ED with 24 hours of worsening shortness of breath, denies associated chest pain.  She arrived in some respiratory distress after bringing herself to the ED, tachypneic and only able to speak in single words at a time.  She was noted to have both wheezing and crackles on exam, although no signs of fluid overload and patient denied any history of CHF or COPD.  She was treated with albuterol and steroids and had a rapid improvement, subsequently able to speak in full sentences with improving tachypnea.  EKG showed frequent PVCs with nonspecific intraventricular conduction delay, but no obvious ischemic changes.  She did later began to complain of  tightness in her chest and troponin was elevated to 53.  Repeat EKG was performed but did not show any ischemic changes.  Chest x-ray did show cardiomegaly with pulmonary vascular congestion, concerning for new onset CHF.  Lasix was ordered and patient began to diurese with improvement in her chest tightness, also given doses of sublingual nitro.  Plan to admit for further work-up of her new onset of heart failure as well as difficulty breathing.      ____________________________________________   FINAL CLINICAL IMPRESSION(S) / ED DIAGNOSES  Final diagnoses:  Shortness of breath  New onset of congestive heart failure ([place])  Chest tightness     ED Discharge Orders    None       Note:  This document was prepared using Dragon voice recognition software and may include unintentional dictation errors.   Blake Divine, MD 12/29/18 [id number]    Blake Divine, MD 01/18/19 [id number]

## 2018-12-29 NOTE — Progress Notes (Signed)
*  PRELIMINARY RESULTS* Echocardiogram 2D Echocardiogram has been performed.  Katelyn Lane 12/29/2018, 1:44 PM

## 2018-12-29 NOTE — ED Notes (Signed)
Pt reports CP pain at 7/10

## 2018-12-29 NOTE — ED Notes (Signed)
Patient given breakfast tray. Sitting up in bed eating at this time.

## 2018-12-29 NOTE — ED Notes (Signed)
Pt assisted on to bedpan, reports can't go in the [facility]

## 2018-12-29 NOTE — H&P (Signed)
[facility] NAME: Katelyn Lane    MR#:  [id number]  DATE OF BIRTH:  12-May-1955  DATE OF ADMISSION:  12/29/2018  PRIMARY CARE PHYSICIAN: Alene Mires Elyse Jarvis, MD   REQUESTING/REFERRING PHYSICIAN: Blake Divine, MD  CHIEF COMPLAINT:   Chief Complaint  Patient presents with  . Shortness of Breath    HISTORY OF PRESENT ILLNESS:  Katelyn Lane  is a 63 y.o. African-American female with a known history of hypertension, dyslipidemia, depression and tobacco abuse, presented to the emergency room, with acute onset of worsening dyspnea over the last couple days with associated dry cough and occasional wheezing.  She admitted to fever and chills with associated vomiting without abdominal pain.  She denied any sick exposure to COVID-19.  No lower extremity edema.  She admits to two-pillow orthopnea and paroxysmal nocturnal dyspnea.  She has been having chest tightness graded 8/10 in severity with no radiation.  She also had blurred vision without headache or dizziness or paresthesias or focal muscle weakness.  No dysuria, oliguria or hematuria or flank pain  Upon presentation to the emergency room, blood pressure was 138/95 and later 161/106 with respiratory to 26 and pulse symmetry of 91% on room air.  Labs revealed hypokalemia of 3 and a BUN of 37 creatinine 1.74 compared to 15 and 1.54 in 2014 though.  BNP is 767 and high-sensitivity troponin I is 53.  Lactic acid was 2.  Portable chest ray showed cardiomegaly with mild interstitial pulmonary edema.  EKG showed normal sinus rhythm with rate of 69 with PACs and left atrial enlargement and T wave inversion laterally.  The patient was given 40 mg of IV Lasix for passive albuterol, 125 mg IV Solu-Medrol and sublingual nitroglycerin.  She will be admitted to telemetry bed for further evaluation and management PAST MEDICAL HISTORY:   Past Medical History:  Diagnosis Date  . Hyperlipidemia   . Renal disorder    Hypertension Depression Tobacco abuse PAST SURGICAL HISTORY:  No past surgical history on file.  No reported surgeries.  SOCIAL HISTORY:   Social History   Tobacco Use  . Smoking status: Current Every Day Smoker  . Smokeless tobacco: Never Used  Substance Use Topics  . Alcohol use: No    Alcohol/week: 0.0 standard drinks    FAMILY HISTORY:   Family History  Problem Relation Age of Onset  . Breast cancer Neg Hx     DRUG ALLERGIES:   Allergies  Allergen Reactions  . Penicillins Hives, Other (See Comments) and Rash  . Ace Inhibitors Nausea And Vomiting    REVIEW OF SYSTEMS:   ROS As per history of present illness. All pertinent systems were reviewed above. Constitutional,  HEENT, cardiovascular, respiratory, GI, GU, musculoskeletal, neuro, psychiatric, endocrine,  integumentary and hematologic systems were reviewed and are otherwise  negative/unremarkable except for positive findings mentioned above in the HPI.   MEDICATIONS AT HOME:   Prior to Admission medications   Medication Sig Start Date End Date Taking? Authorizing Provider  amLODipine (NORVASC) 10 MG tablet Take 10 mg by mouth daily.  03/31/14 11/03/19 Yes [provider]  atorvastatin (LIPITOR) 20 MG tablet Take 20 mg by mouth daily at 6 PM.    Yes [provider]  FLUoxetine (PROZAC) 40 MG capsule Take 40 mg by mouth daily.    Yes [provider]  hydrochlorothiazide (HYDRODIURIL) 25 MG tablet Take 25 mg by mouth daily.    Yes [provider]  traZODone (DESYREL) 100 MG tablet Take 150 mg by mouth at bedtime as needed. 09/08/13  Yes [provider]      VITAL SIGNS:  Blood pressure (!) 134/93, pulse (!) 40, resp. rate (!) 23, height 5\' 7"  (1.702 m), weight 102.1 kg, SpO2 91 %.  PHYSICAL EXAMINATION:  Physical Exam  GENERAL:  63 y.o.-year-old African-American female patient lying in the bed with no acute distress.  EYES: Pupils equal, round, reactive to  light and accommodation. No scleral icterus. Extraocular muscles intact.  HEENT: Head atraumatic, normocephalic. Oropharynx and nasopharynx clear.  NECK:  Supple, no jugular venous distention. No thyroid enlargement, no tenderness.  LUNGS: Normal breath sounds bilaterally, no wheezing, rales,rhonchi or crepitation. No use of accessory muscles of respiration.  CARDIOVASCULAR: Regular rate and rhythm, S1, S2 normal. No murmurs, rubs, or gallops.  ABDOMEN: Soft, nondistended, nontender. Bowel sounds present. No organomegaly or mass.  EXTREMITIES: No pedal edema, cyanosis, or clubbing.  NEUROLOGIC: Cranial nerves II through XII are intact. Muscle strength 5/5 in all extremities. Sensation intact. Gait not checked.  PSYCHIATRIC: The patient is alert and oriented x 3.  Normal affect and good eye contact. SKIN: No obvious rash, lesion, or ulcer.   LABORATORY PANEL:   CBC Recent Labs  Lab 12/29/18 0057  WBC 7.3  HGB 12.8  HCT 42.2  PLT 292   ------------------------------------------------------------------------------------------------------------------  Chemistries  Recent Labs  Lab 12/29/18 0057  NA 139  K 3.0*  CL 105  CO2 28  GLUCOSE 157*  BUN 37*  CREATININE 1.74*  CALCIUM 8.9   ------------------------------------------------------------------------------------------------------------------  Cardiac Enzymes No results for input(s): TROPONINI in the last 168 hours. ------------------------------------------------------------------------------------------------------------------  RADIOLOGY:  DG Chest Portable 1 View  Result Date: 12/29/2018 CLINICAL DATA:  Shortness of breath EXAM: PORTABLE CHEST 1 VIEW COMPARISON:  10/18/2016 FINDINGS: Mild cardiomegaly. There is mild interstitial pulmonary edema. No focal consolidation. No pneumothorax or sizable pleural effusion. IMPRESSION: Cardiomegaly with mild interstitial pulmonary edema. Electronically Signed   By: Ulyses Jarred  M.D.   On: 12/29/2018 01:27      IMPRESSION AND PLAN:   1.  Acute heart failure of new onset, likely diastolic with preserved ejection fraction.  The patient will be admitted to a telemetry bed.  We will continue diuresis with 40 mg of IV Lasix twice daily.  Will obtain a 2D echo and a cardiology consultation by [facility] clinic.  I notified Dr. Nehemiah Massed about the patient.  We will follow serial troponin I.  2.  Chest tightness/pain.  The patient will be followed with serial troponin I and EKG.  A 2D echo and a cardiology consultation will be obtained as mentioned above for further cardiac risk stratification.  The patient will be placed on aspirin as well as para insulin nitroglycerin and morphine sulfate for pain.  3.  Hypertensive urgency.  The patient will be placed on her amlodipine and will add as needed IV labetalol and hydralazine.  4.  Acute kidney injury superimposed on stage IIIb chronic kidney disease.  This could be prerenal due to acute heart failure.  Will monitor renal functions with diuresis.  We will hold irbesartan and HCTZ for now.  5.  Dyslipidemia.  Continue statin therapy.  6.  Depression.  We will continue Prozac and trazodone.  7.  DVT prophylaxis.  Subcutaneous Lovenox.   All the records are reviewed and case discussed with ED provider. The plan of care was discussed in details with the patient (and family). I answered all questions.  The patient agreed to proceed with the above mentioned plan. Further management will depend upon hospital course.   CODE STATUS: I discussed the CODE STATUS with the patient and she desires to be  full code.  TOTAL TIME TAKING CARE OF THIS PATIENT: 55 minutes.    Christel Mormon M.D on 12/29/2018 at 4:50 AM  [facility]   From 7 PM-7 AM, contact night-coverage [email or web address]  CC: Primary care physician; [place], Elyse Jarvis, MD   Note: This dictation was prepared with Dragon dictation along with smaller phrase  technology. Any transcriptional errors that result from this process are unintentional.

## 2018-12-29 NOTE — TOC Initial Note (Signed)
Transition of Care [facility]) - Initial/Assessment Note    Patient Details  Name: Katelyn Lane MRN: [id number] Date of Birth: 1955-05-08  Transition of Care [facility]) CM/SW Contact:    Anselm Pancoast, RN Phone Number: 12/29/2018, 12:22 PM  Clinical Narrative:                 63 year old admitted with shortness of breath. Patient requested to see case manager and expressed concerns regarding cost of hospitalization. CM confirmed patient still had Medicaid and reviewed coverage with patient. Patient expressed a request for financial assistance to cover Christmas gifts. CM advised not aware of any assistance available for that at this time.  Expected Discharge Plan: Home/Self Care     Patient Goals and CMS Choice Patient states their goals for this hospitalization and ongoing recovery are:: Get home      Expected Discharge Plan and Services Expected Discharge Plan: Home/Self Care       Living arrangements for the past 2 months: Single Family Home                                      Prior Living Arrangements/Services Living arrangements for the past 2 months: Single Family Home Lives with:: Self Patient language and need for interpreter reviewed:: Yes Do you feel safe going back to the place where you live?: Yes      Need for Family Participation in Patient Care: No (Comment) Care giver support system in place?: Yes (comment)   Criminal Activity/Legal Involvement Pertinent to Current Situation/Hospitalization: No - Comment as needed  Activities of Daily Living      Permission Sought/Granted                  Emotional Assessment   Attitude/Demeanor/Rapport: Engaged Affect (typically observed): Accepting Orientation: : Oriented to Self, Oriented to Place, Oriented to  Time, Oriented to Situation Alcohol / Substance Use: Tobacco Use Psych Involvement: No (comment)  Admission diagnosis:  Acute CHF (congestive heart failure) ([place]) [I50.9] Patient Active  Problem List   Diagnosis Date Noted  . Acute CHF (congestive heart failure) ([place]) 12/29/2018   PCP:  [name], MD Pharmacy:   [place], [place] - [address] [place] [address] Phone: [phone] Fax: [phone]     Social Determinants of Health (SDOH) Interventions    Readmission Risk Interventions No flowsheet data found.

## 2018-12-29 NOTE — Progress Notes (Signed)
  PROGRESS NOTE    Katelyn Lane  [id number] DOB: [birth year] DOA: 12/29/2018  PCP: Katelyn Burrow, MD    LOS - 0   Patient admitted earlier this morning with acute decompensation of CHF.  Diuresing well with IV Lasix, net fluid balance -2L so far.  Patient says feeling a lot better.  Echo showed EF <20% Discussed with patient and educated regarding further evaluation. She endorses having had some intermittent but mild episodes chest pain that resolve on their own, she thinks maybe over past several months.  On exam patient appears comfortable but restless.   Lungs clear, no peripheral edema.  Heart RRR.  I have reviewed the full H&P by Dr. Sidney Lane and agree with the assessment and plan as outlined.  CHF management and evaluation per cardiology.    Katelyn Slocumb, DO [facility]   If 7PM-7AM, please contact night-coverage [email or web address] Password [facility] 12/29/2018, 8:56 AM

## 2018-12-29 NOTE — ED Notes (Signed)
Report provided to [place], [place]

## 2018-12-29 NOTE — ED Notes (Signed)
Patient complaining of itching skin. Reports this is common for her at home and she normally takes cetirizine for it. MD notified. Awaiting new orders at this time.

## 2018-12-29 NOTE — ED Notes (Signed)
CRITICAL LAB: LACTIC is 2.0, [employer], Dr. Charna Archer notified, orders received

## 2018-12-29 NOTE — ED Notes (Signed)
ED Provider at bedside. 

## 2018-12-29 NOTE — ED Triage Notes (Signed)
Patient coming in for SOB. Patient with labored respirations, unable to answer questions due to SOB.

## 2018-12-29 NOTE — ED Notes (Signed)
Patient given water to drink. Placed on bed pan. Call light in reach. No further needs expressed at this time.

## 2018-12-29 NOTE — ED Notes (Signed)
CRITICAL LAB: TROPONIN and LACTIC still elevated, [facility], Sharion Settler, NP notified

## 2018-12-30 ENCOUNTER — Encounter: Payer: Self-pay | Admitting: Cardiology

## 2018-12-30 ENCOUNTER — Encounter
Admission: EM | Disposition: A | Discharge status: Home / Self Care | Payer: Self-pay | Source: Home / Self Care | Attending: Internal Medicine

## 2018-12-30 DIAGNOSIS — I5031 Acute diastolic (congestive) heart failure: Secondary | ICD-10-CM

## 2018-12-30 HISTORY — PX: RIGHT/LEFT HEART CATH AND CORONARY ANGIOGRAPHY: CATH118266

## 2018-12-30 LAB — TROPONIN I (HIGH SENSITIVITY): Troponin I (High Sensitivity): 40 ng/L — ABNORMAL HIGH (ref <18)

## 2018-12-30 LAB — BASIC METABOLIC PANEL
Anion gap: 9 (ref 5–15)
BUN: 47 mg/dL — ABNORMAL HIGH (ref 8–23)
CO2: 27 mmol/L (ref 22–32)
Calcium: 8.8 mg/dL — ABNORMAL LOW (ref 8.9–10.3)
Chloride: 103 mmol/L (ref 98–111)
Creatinine, Ser: 1.86 mg/dL — ABNORMAL HIGH (ref 0.44–1.00)
GFR calc Af Amer: 33 mL/min — ABNORMAL LOW (ref >60)
GFR calc non Af Amer: 28 mL/min — ABNORMAL LOW (ref >60)
Glucose, Bld: 181 mg/dL — ABNORMAL HIGH (ref 70–99)
Potassium: 3.4 mmol/L — ABNORMAL LOW (ref 3.5–5.1)
Sodium: 139 mmol/L (ref 135–145)

## 2018-12-30 SURGERY — RIGHT/LEFT HEART CATH AND CORONARY ANGIOGRAPHY
Anesthesia: Moderate Sedation

## 2018-12-30 MED ORDER — MIDAZOLAM HCL 2 MG/2ML IJ SOLN
INTRAMUSCULAR | Status: AC
  Filled 2018-12-30: qty 2

## 2018-12-30 MED ORDER — FENTANYL CITRATE (PF) 100 MCG/2ML IJ SOLN
INTRAMUSCULAR | Status: DC | PRN
  Administered 2018-12-30: 25 ug via INTRAVENOUS

## 2018-12-30 MED ORDER — HEPARIN SODIUM (PORCINE) 5000 UNIT/ML IJ SOLN
5000.0000 [IU] | Freq: Two times a day (BID) | INTRAMUSCULAR | Status: DC
  Administered 2018-12-30 – 2019-01-01 (×4): 5000 [IU] via SUBCUTANEOUS
  Filled 2018-12-30 (×4): qty 1

## 2018-12-30 MED ORDER — HEPARIN (PORCINE) IN NACL 1000-0.9 UT/500ML-% IV SOLN
INTRAVENOUS | Status: DC | PRN
  Administered 2018-12-30: 500 mL

## 2018-12-30 MED ORDER — CARVEDILOL 3.125 MG PO TABS
3.1250 mg | ORAL_TABLET | Freq: Two times a day (BID) | ORAL | Status: DC
  Administered 2018-12-30 – 2019-01-01 (×3): 3.125 mg via ORAL
  Filled 2018-12-30 (×4): qty 1

## 2018-12-30 MED ORDER — ISOSORBIDE MONONITRATE ER 30 MG PO TB24
15.0000 mg | ORAL_TABLET | Freq: Every day | ORAL | Status: DC
  Administered 2018-12-31 – 2019-01-01 (×2): 15 mg via ORAL
  Filled 2018-12-30 (×2): qty 1

## 2018-12-30 MED ORDER — NITROGLYCERIN 0.4 MG SL SUBL
SUBLINGUAL_TABLET | SUBLINGUAL | Status: AC
  Administered 2018-12-30: 12:00:00 0.4 mg via SUBLINGUAL
  Filled 2018-12-30: qty 1

## 2018-12-30 MED ORDER — MIDAZOLAM HCL 2 MG/2ML IJ SOLN
INTRAMUSCULAR | Status: DC | PRN
  Administered 2018-12-30: 0.5 mg via INTRAVENOUS

## 2018-12-30 MED ORDER — ACETAMINOPHEN 325 MG PO TABS
650.0000 mg | ORAL_TABLET | ORAL | Status: DC | PRN
  Administered 2018-12-31: 650 mg via ORAL
  Filled 2018-12-30: qty 2

## 2018-12-30 MED ORDER — LABETALOL HCL 5 MG/ML IV SOLN: 10.0000 mg | INTRAVENOUS | Status: AC | PRN

## 2018-12-30 MED ORDER — SODIUM CHLORIDE 0.9 % IV SOLN: 250.0000 mL | INTRAVENOUS | Status: DC | PRN

## 2018-12-30 MED ORDER — SODIUM CHLORIDE 0.9% FLUSH: 3.0000 mL | INTRAVENOUS | Status: DC | PRN

## 2018-12-30 MED ORDER — ONDANSETRON HCL 4 MG/2ML IJ SOLN: 4.0000 mg | Freq: Four times a day (QID) | INTRAMUSCULAR | Status: DC | PRN

## 2018-12-30 MED ORDER — HEPARIN (PORCINE) IN NACL 1000-0.9 UT/500ML-% IV SOLN
INTRAVENOUS | Status: AC
  Filled 2018-12-30: qty 1000

## 2018-12-30 MED ORDER — ALPRAZOLAM 0.5 MG PO TABS
ORAL_TABLET | ORAL | Status: AC
  Filled 2018-12-30: qty 1

## 2018-12-30 MED ORDER — FUROSEMIDE 10 MG/ML IJ SOLN
INTRAMUSCULAR | Status: DC | PRN
  Administered 2018-12-30: 40 mg via INTRAVENOUS

## 2018-12-30 MED ORDER — FUROSEMIDE 10 MG/ML IJ SOLN
INTRAMUSCULAR | Status: AC
  Filled 2018-12-30: qty 4

## 2018-12-30 MED ORDER — SODIUM CHLORIDE 0.9 % WEIGHT BASED INFUSION: 3.0000 mL/kg/h | INTRAVENOUS | Status: DC

## 2018-12-30 MED ORDER — SPIRONOLACTONE 25 MG PO TABS
12.5000 mg | ORAL_TABLET | Freq: Every day | ORAL | Status: DC
  Administered 2018-12-31 – 2019-01-01 (×2): 12.5 mg via ORAL
  Filled 2018-12-30 (×3): qty 0.5
  Filled 2018-12-30: qty 1

## 2018-12-30 MED ORDER — POTASSIUM CHLORIDE CRYS ER 20 MEQ PO TBCR
40.0000 meq | EXTENDED_RELEASE_TABLET | ORAL | Status: DC
  Administered 2018-12-30: 10:00:00 40 meq via ORAL
  Filled 2018-12-30 (×2): qty 2

## 2018-12-30 MED ORDER — ASPIRIN 81 MG PO CHEW: 81.0000 mg | CHEWABLE_TABLET | ORAL | Status: DC

## 2018-12-30 MED ORDER — SODIUM CHLORIDE 0.9% FLUSH
3.0000 mL | Freq: Two times a day (BID) | INTRAVENOUS | Status: DC
  Administered 2018-12-30 – 2018-12-31 (×2): 3 mL via INTRAVENOUS

## 2018-12-30 MED ORDER — IOHEXOL 300 MG/ML  SOLN
INTRAMUSCULAR | Status: DC | PRN
  Administered 2018-12-30: 90 mL

## 2018-12-30 MED ORDER — FENTANYL CITRATE (PF) 100 MCG/2ML IJ SOLN
INTRAMUSCULAR | Status: AC
  Filled 2018-12-30: qty 2

## 2018-12-30 MED ORDER — SODIUM CHLORIDE 0.9 % WEIGHT BASED INFUSION: 1.0000 mL/kg/h | INTRAVENOUS | Status: DC

## 2018-12-30 MED ORDER — GUAIFENESIN-DM 100-10 MG/5ML PO SYRP
15.0000 mL | ORAL_SOLUTION | ORAL | Status: DC | PRN
  Administered 2018-12-30 – 2018-12-31 (×2): 15 mL via ORAL
  Filled 2018-12-30 (×3): qty 15

## 2018-12-30 MED ORDER — SODIUM CHLORIDE 0.9 % WEIGHT BASED INFUSION: 1.0000 mL/kg/h | INTRAVENOUS | Status: AC

## 2018-12-30 MED ORDER — HYDRALAZINE HCL 20 MG/ML IJ SOLN: 10.0000 mg | INTRAMUSCULAR | Status: AC | PRN

## 2018-12-30 SURGICAL SUPPLY — 15 items
CATH INFINITI 5FR ANG PIGTAIL (CATHETERS) ×2 IMPLANT
CATH INFINITI 5FR JL4 (CATHETERS) ×2 IMPLANT
CATH INFINITI JR4 5F (CATHETERS) ×2 IMPLANT
CATH SWANZ 7F THERMO (CATHETERS) ×2 IMPLANT
DEVICE CLOSURE MYNXGRIP 5F (Vascular Products) ×2 IMPLANT
DEVICE CLOSURE MYNXGRIP 6/7F (Vascular Products) ×2 IMPLANT
DEVICE SAFEGUARD 24CM (GAUZE/BANDAGES/DRESSINGS) ×2 IMPLANT
KIT MANI 3VAL PERCEP (MISCELLANEOUS) ×3 IMPLANT
KIT RIGHT HEART (MISCELLANEOUS) ×2 IMPLANT
NDL PERC 18GX7CM (NEEDLE) IMPLANT
NEEDLE PERC 18GX7CM (NEEDLE) ×3 IMPLANT
PACK CARDIAC CATH (CUSTOM PROCEDURE TRAY) ×3 IMPLANT
SHEATH AVANTI 5FR X 11CM (SHEATH) ×2 IMPLANT
SHEATH AVANTI 7FRX11 (SHEATH) ×2 IMPLANT
WIRE GUIDERIGHT .035X150 (WIRE) ×2 IMPLANT

## 2018-12-30 NOTE — Progress Notes (Signed)
PROGRESS NOTE    Katelyn Lane  [id number] DOB: 1955-10-21 DOA: 12/29/2018  PCP: [name], MD    LOS - 1   Brief Narrative:  63 y.o. African-American female with a known history of hypertension, dyslipidemia, depression and tobacco abuse, presented to the ED, with acute onset of worsening dyspnea over the last couple days,  with associated dry cough and occasional wheezing.  She reported subjective fever/chills, some vomiting but no abdominal pain.  Also reported two-pillow orthopnea, paroxysmal nocturnal dyspnea, and chest tightness with no radiation.  In the ED, BP as high as 161/106, RR 26, spO2 91% on room air.  Labs notable for K 3, BUN 37, Cr 1.74 (was 1.54 in 2014).  BNP elevated at 767.  Hs-troponin mildly elevated at 53.  Lactate 2.  Portable CXR showed cardiomegaly with interstitial pulmonary edema.  ECG showed NSR 69 bpm with PAC's, LAE and lateral T wave inversions.  She was started on diuresis with IV Lasix and admitted to telemetry with cardiology consulted.  Echo showed EF of 15%.  Left heart cath 12/22 showed severe dilated cardiomyopathy with EF 15%.  No obstructive coronary disease.    Subjective 12/22: Patient seen after heart cath today.  She was sleeping but awoke to voice.  Reports feeling better.  Asks if she gets to go home yet.  Denies worsening SOB or orthopnea, and chest pain.  No fever/chills or other acute complaints.  No acute events reported.   Assessment & Plan:   Active Problems:   Acute CHF (congestive heart failure) (HCC)  Acute Decompensation of Systolic CHF Dilated Cardiomyopathy --cardiology following --continue IV diuresis for now --Coreg, Imdur, hydralazine, spironolactone for HTN and DCM --no ACEI due to allergy --cardiac rehab on discharge --optimize volume status prior to discharge  Chest Pain/Tightness - resolved Elevated Troponin, present on admission --troponin likely up due to demand ischemia.  Normal coronaries on  heart cath 12/22.  Hypertensive urgency - suspect due to volume overload vs noncompliance.  Resolved, now with borderline low BP after starting above medications. --stop amlodipine  - PRN IV labetalol and hydralazine  AKI superimposed on stage IIIb chronic kidney disease.   Suspect prerenal in setting of acute CHF.   -- monitor renal function closely with diuresis --hold ARB and HCTZ  Dyslipidemia -- continue Lipitor  Depression   -- continue Prozac and trazodone   DVT prophylaxis: heparin   Code Status: Full Code  Family Communication: none at bedside  Disposition Plan:  Expect discharge home in 1-2 more days, pending further diuresis and cardiology sign off.   Consultants:   Cardiology  Procedures: Include things that cannot be auto populated i.e. Echo, Carotid and venous dopplers, Foley, Bipap, HD, tubes/drains, wound vac, central lines etc)  Echo - EF 15%  Left Heart Cath - dilated cardiomyopathy.  Normal coronary arteries.  Antimicrobials:   None    Objective: Vitals:   12/29/18 2041 12/29/18 2046 12/30/18 0348 12/30/18 0729  BP:  (!) 132/92 (!) 117/96 103/72  Pulse:  83 71 61  Resp:  19 16   Temp:  98.8 F (37.1 C) 97.9 F (36.6 C) 97.6 F (36.4 C)  TempSrc:  Oral Oral Oral  SpO2:  97% 98% 98%  Weight: 100 kg  99.2 kg   Height: 5\' 7"  (1.702 m)       Intake/Output Summary (Last 24 hours) at 12/30/2018 0855 Last data filed at 12/30/2018 0729 Gross per 24 hour  Intake 240 ml  Output  1950 ml  Net -1710 ml   Filed Weights   12/29/18 0117 12/29/18 2041 12/30/18 0348  Weight: 102.1 kg 100 kg 99.2 kg    Examination:  General exam: awake, alert, no acute distress, obese HEENT: moist mucus membranes, hearing grossly normal  Respiratory system: diminished but clear to auscultation bilaterally, no wheezes, rales or rhonchi, normal respiratory effort. Cardiovascular system: normal S1/S2, RRR, no JVD, murmurs, rubs, gallops, no pedal edema.     Central nervous system: alert and oriented x4. no gross focal neurologic deficits, normal speech Extremities: moves all, no edema, normal tone Skin: dry, intact, normal temperature Psychiatry: normal mood, congruent affect, judgement and insight appear normal    Data Reviewed: I have personally reviewed following labs and imaging studies  CBC: Recent Labs  Lab 12/29/18 0057  WBC 7.3  NEUTROABS 4.3  HGB 12.8  HCT 42.2  MCV 84.9  PLT 123456   Basic Metabolic Panel: Recent Labs  Lab 12/29/18 0057 12/30/18 0438  NA 139 139  K 3.0* 3.4*  CL 105 103  CO2 28 27  GLUCOSE 157* 181*  BUN 37* 47*  CREATININE 1.74* 1.86*  CALCIUM 8.9 8.8*   GFR: Estimated Creatinine Clearance: 37.4 mL/min (A) (by C-G formula based on SCr of 1.86 mg/dL (H)). Liver Function Tests: No results for input(s): AST, ALT, ALKPHOS, BILITOT, PROT, ALBUMIN in the last 168 hours. No results for input(s): LIPASE, AMYLASE in the last 168 hours. No results for input(s): AMMONIA in the last 168 hours. Coagulation Profile: No results for input(s): INR, PROTIME in the last 168 hours. Cardiac Enzymes: No results for input(s): CKTOTAL, CKMB, CKMBINDEX, TROPONINI in the last 168 hours. BNP (last 3 results) No results for input(s): PROBNP in the last 8760 hours. HbA1C: No results for input(s): HGBA1C in the last 72 hours. CBG: No results for input(s): GLUCAP in the last 168 hours. Lipid Profile: No results for input(s): CHOL, HDL, LDLCALC, TRIG, CHOLHDL, LDLDIRECT in the last 72 hours. Thyroid Function Tests: No results for input(s): TSH, T4TOTAL, FREET4, T3FREE, THYROIDAB in the last 72 hours. Anemia Panel: No results for input(s): VITAMINB12, FOLATE, FERRITIN, TIBC, IRON, RETICCTPCT in the last 72 hours. Sepsis Labs: Recent Labs  Lab 12/29/18 0057 12/29/18 0327  LATICACIDVEN 2.0* 2.0*    Recent Results (from the past 240 hour(s))  SARS CORONAVIRUS 2 (TAT 6-24 HRS) Nasopharyngeal Nasopharyngeal Swab      Status: None   Collection Time: 12/29/18  2:03 AM   Specimen: Nasopharyngeal Swab  Result Value Ref Range Status   SARS Coronavirus 2 NEGATIVE NEGATIVE Final    Comment: (NOTE) SARS-CoV-2 target nucleic acids are NOT DETECTED. The SARS-CoV-2 RNA is generally detectable in upper and lower respiratory specimens during the acute phase of infection. Negative results do not preclude SARS-CoV-2 infection, do not rule out co-infections with other pathogens, and should not be used as the sole basis for treatment or other patient management decisions. Negative results must be combined with clinical observations, patient history, and epidemiological information. The expected result is Negative. Fact Sheet for Patients: SugarRoll.be Fact Sheet for Healthcare Providers: https://www.woods-mathews.com/ This test is not yet approved or cleared by the [place] FDA and  has been authorized for detection and/or diagnosis of SARS-CoV-2 by FDA under an Emergency Use Authorization (EUA). This EUA will remain  in effect (meaning this test can be used) for the duration of the COVID-19 declaration under Section 56 4(b)(1) of the Act, 21 U.S.C. section 360bbb-3(b)(1), unless the authorization is terminated  or revoked sooner. Performed at [facility], [place] [address] [place], [place] [address]          Radiology Studies: DG Chest Portable 1 View  Result Date: 12/29/2018 CLINICAL DATA:  Shortness of breath EXAM: PORTABLE CHEST 1 VIEW COMPARISON:  10/18/2016 FINDINGS: Mild cardiomegaly. There is mild interstitial pulmonary edema. No focal consolidation. No pneumothorax or sizable pleural effusion. IMPRESSION: Cardiomegaly with mild interstitial pulmonary edema. Electronically Signed   By: Ulyses Jarred M.D.   On: 12/29/2018 01:27   ECHOCARDIOGRAM COMPLETE  Result Date: 12/29/2018   ECHOCARDIOGRAM REPORT   Patient Name:   BARBAR LABELL Date of Exam:  12/29/2018 Medical Rec #:  [id number]     Height:       67.0 in Accession #:    [id number]    Weight:       225.0 lb Date of Birth:  August 02, 1955    BSA:          2.13 m Patient Age:    18 years      BP:           125/85 mmHg Patient Gender: F             HR:           61 bpm. Exam Location:  [facility] Procedure: 2D Echo, Cardiac Doppler and Color Doppler Indications:     CHF- acute diastolic [id number]  History:         Patient has no prior history of Echocardiogram examinations.                  Hyperlipidemia, renal disorder.  Sonographer:     [name] RDCS (AE) Referring Phys:  [id number] Arvella Merles MANSY Diagnosing Phys: Kathlyn Sacramento MD IMPRESSIONS  1. Left ventricular ejection fraction, by visual estimation, is <20%. The left ventricle has normal function. There is borderline left ventricular hypertrophy.  2. Left ventricular diastolic parameters are consistent with Grade III diastolic dysfunction (restrictive).  3. Severely dilated left ventricular internal cavity size.  4. The left ventricle demonstrates global hypokinesis.  5. Global right ventricle has normal systolic function.The right ventricular size is normal. No increase in right ventricular wall thickness.  6. Left atrial size was moderately dilated.  7. Right atrial size was normal.  8. Small pericardial effusion.  9. The pericardial effusion is posterior to the left ventricle. 10. The mitral valve is normal in structure. Mild to moderate mitral valve regurgitation. No evidence of mitral stenosis. 11. The tricuspid valve is normal in structure. Tricuspid valve regurgitation is trivial. 12. The aortic valve is normal in structure. Aortic valve regurgitation is not visualized. No evidence of aortic valve sclerosis or stenosis. 13. The pulmonic valve was normal in structure. Pulmonic valve regurgitation is not visualized. 14. TR signal is inadequate for assessing pulmonary artery systolic pressure. FINDINGS  Left Ventricle: Left ventricular ejection fraction, by  visual estimation, is <20%. The left ventricle has normal function. The left ventricle demonstrates global hypokinesis. The left ventricular internal cavity size was severely dilated left ventricle.  There is borderline left ventricular hypertrophy. Left ventricular diastolic parameters are consistent with Grade III diastolic dysfunction (restrictive). Normal left atrial pressure. Right Ventricle: The right ventricular size is normal. No increase in right ventricular wall thickness. Global RV systolic function is has normal systolic function. The tricuspid regurgitant velocity is 2.70 m/s, and with an assumed right atrial pressure  of 10 mmHg, the estimated right ventricular systolic pressure is TR  signal is inadequate for assessing PA pressure at 39.1 mmHg. Left Atrium: Left atrial size was moderately dilated. Right Atrium: Right atrial size was normal in size Pericardium: A small pericardial effusion is present. The pericardial effusion is posterior to the left ventricle. Mitral Valve: The mitral valve is normal in structure. Mild to moderate mitral valve regurgitation. No evidence of mitral valve stenosis by observation. Tricuspid Valve: The tricuspid valve is normal in structure. Tricuspid valve regurgitation is trivial. Aortic Valve: The aortic valve is normal in structure. Aortic valve regurgitation is not visualized. The aortic valve is structurally normal, with no evidence of sclerosis or stenosis. Aortic valve mean gradient measures 5.0 mmHg. Aortic valve peak gradient measures 7.6 mmHg. Aortic valve area, by VTI measures 1.97 cm. Pulmonic Valve: The pulmonic valve was normal in structure. Pulmonic valve regurgitation is not visualized. Pulmonic regurgitation is not visualized. Aorta: The aortic root, ascending aorta and aortic arch are all structurally normal, with no evidence of dilitation or obstruction. Venous: The inferior vena cava was not well visualized. IAS/Shunts: No atrial level shunt detected  by color flow Doppler. There is no evidence of a patent foramen ovale. No ventricular septal defect is seen or detected. There is no evidence of an atrial septal defect.  LEFT VENTRICLE PLAX 2D LVIDd:         6.54 cm       Diastology LVIDs:         6.17 cm       LV e' lateral:   2.72 cm/s LV PW:         1.48 cm       LV E/e' lateral: 38.6 LV IVS:        0.85 cm       LV e' medial:    5.33 cm/s LVOT diam:     2.30 cm       LV E/e' medial:  19.7 LV SV:         27 ml LV SV Index:   12.15 LVOT Area:     4.15 cm  LV Volumes (MOD) LV area d, A2C:    62.40 cm LV area d, A4C:    55.70 cm LV area s, A2C:    52.10 cm LV area s, A4C:    47.50 cm LV major d, A2C:   8.92 cm LV major d, A4C:   8.54 cm LV major s, A2C:   8.40 cm LV major s, A4C:   8.18 cm LV vol d, MOD A2C: 362.0 ml LV vol d, MOD A4C: 303.0 ml LV vol s, MOD A2C: 272.0 ml LV vol s, MOD A4C: 228.0 ml LV SV MOD A2C:     90.0 ml LV SV MOD A4C:     303.0 ml LV SV MOD BP:      85.8 ml RIGHT VENTRICLE RV Basal diam:  3.26 cm RV S prime:     10.10 cm/s TAPSE (M-mode): 4.0 cm LEFT ATRIUM              Index       RIGHT ATRIUM           Index LA diam:        5.10 cm  2.40 cm/m  RA Area:     15.00 cm LA Vol (A2C):   132.0 ml 62.09 ml/m RA Volume:   42.30 ml  19.90 ml/m LA Vol (A4C):   57.9 ml  27.23 ml/m LA Biplane Vol: 91.0 ml  42.80  ml/m  AORTIC VALVE                    PULMONIC VALVE AV Area (Vmax):    1.56 cm     RVOT Peak grad: 3 mmHg AV Area (Vmean):   1.48 cm AV Area (VTI):     1.97 cm AV Vmax:           137.50 cm/s AV Vmean:          100.650 cm/s AV VTI:            0.253 m AV Peak Grad:      7.6 mmHg AV Mean Grad:      5.0 mmHg LVOT Vmax:         51.70 cm/s LVOT Vmean:        35.900 cm/s LVOT VTI:          0.120 m LVOT/AV VTI ratio: 0.47  AORTA Ao Root diam: 2.70 cm MITRAL VALVE                         TRICUSPID VALVE MV Area (PHT): 3.77 cm              TR Peak grad:   29.1 mmHg MV PHT:        58.29 msec            TR Vmax:        270.00 cm/s MV Decel  Time: 201 msec MV E velocity: 105.00 cm/s 103 cm/s  SHUNTS MV A velocity: 40.30 cm/s  70.3 cm/s Systemic VTI:  0.12 m MV E/A ratio:  2.61        1.5       Systemic Diam: 2.30 cm  Kathlyn Sacramento MD Electronically signed by Kathlyn Sacramento MD Signature Date/Time: 12/29/2018/1:55:43 PM    Final         Scheduled Meds: . amLODipine  10 mg Oral Daily  . aspirin EC  81 mg Oral Daily  . atorvastatin  20 mg Oral q1800  . enoxaparin (LOVENOX) injection  40 mg Subcutaneous Q24H  . FLUoxetine  40 mg Oral Daily  . furosemide  40 mg Intravenous Q12H  . loratadine  10 mg Oral Daily  . metoprolol tartrate  25 mg Oral BID  . potassium chloride  40 mEq Oral Q4H  . sodium chloride flush  3 mL Intravenous Q12H  . sodium chloride flush  3 mL Intravenous Q12H   Continuous Infusions: . sodium chloride       LOS: 1 day    Time spent: 35-40 minutes    Ezekiel Slocumb, DO [facility]   If 7PM-7AM, please contact night-coverage [email or web address] Password [facility] 12/30/2018, 8:55 AM

## 2018-12-30 NOTE — Progress Notes (Signed)
[facility] Cardiology [facility] Encounter Note  Patient: Katelyn Lane / Admit Date: 12/29/2018 / Date of Encounter: 12/30/2018, 1:31 PM   Subjective: Patient feeling much better since admission.  Less shortness of breath although when lying flat she does have orthopnea.  Patient has had significant improvements with IV Lasix.  Blood pressure is low at this point and will need further treatment with appropriate heart failure medications Cardiac catheterization showing severe dilated cardiomyopathy with ejection fraction of 15% Normal coronary arteries Elevated pulmonary and pulmonary capillary wedge pressures Echocardiogram showing a severe dilated cardiomyopathy and ejection fraction of 15%  Review of Systems: Positive for: Shortness of breath Negative for: Vision change, hearing change, syncope, dizziness, nausea, vomiting,diarrhea, bloody stool, stomach pain, cough, congestion, diaphoresis, urinary frequency, urinary pain,skin lesions, skin rashes Others previously listed  Objective: Telemetry: Normal sinus rhythm Physical Exam: Blood pressure 106/73, pulse 60, temperature 98 F (36.7 C), temperature source Oral, resp. rate 13, height 5\' 7"  (1.702 m), weight 100.8 kg, SpO2 99 %. Body mass index is 34.81 kg/m. General: Well developed, well nourished, in no acute distress. Head: Normocephalic, atraumatic, sclera non-icteric, no xanthomas, nares are without discharge. Neck: No apparent masses Lungs: Normal respirations with no wheezes, no rhonchi, basilar rales , few crackles   Heart: Regular rate and rhythm, normal S1 S2, no murmur, no rub, no gallop, PMI is normal size and placement, carotid upstroke normal without bruit, jugular venous pressure normal Abdomen: Soft, non-tender, non-distended with normoactive bowel sounds. No hepatosplenomegaly. Abdominal aorta is normal size without bruit Extremities: No edema, no clubbing, no cyanosis, no ulcers,  Peripheral: 2+ radial, 2+  femoral, 2+ dorsal pedal pulses Neuro: Alert and oriented. Moves all extremities spontaneously. Psych:  Responds to questions appropriately with a normal affect.   Intake/Output Summary (Last 24 hours) at 12/30/2018 1331 Last data filed at 12/30/2018 1023 Gross per 24 hour  Intake 240 ml  Output 1500 ml  Net -1260 ml    Inpatient Medications:  . ALPRAZolam      . [MAR Hold] amLODipine  10 mg Oral Daily  . [MAR Hold] aspirin EC  81 mg Oral Daily  . [MAR Hold] atorvastatin  20 mg Oral q1800  . [MAR Hold] enoxaparin (LOVENOX) injection  40 mg Subcutaneous Q24H  . [MAR Hold] FLUoxetine  40 mg Oral Daily  . [MAR Hold] furosemide  40 mg Intravenous Q12H  . [MAR Hold] loratadine  10 mg Oral Daily  . [MAR Hold] metoprolol tartrate  25 mg Oral BID  . [MAR Hold] potassium chloride  40 mEq Oral Q4H  . [MAR Hold] sodium chloride flush  3 mL Intravenous Q12H  . [MAR Hold] sodium chloride flush  3 mL Intravenous Q12H   Infusions:  . [MAR Hold] sodium chloride    . sodium chloride    . [START ON 12/31/2018] sodium chloride     Followed by  . [START ON 12/31/2018] sodium chloride 1 mL/kg/hr (12/30/18 1211)    Labs: Recent Labs    12/29/18 0057 12/30/18 0438  NA 139 139  K 3.0* 3.4*  CL 105 103  CO2 28 27  GLUCOSE 157* 181*  BUN 37* 47*  CREATININE 1.74* 1.86*  CALCIUM 8.9 8.8*   No results for input(s): AST, ALT, ALKPHOS, BILITOT, PROT, ALBUMIN in the last 72 hours. Recent Labs    12/29/18 0057  WBC 7.3  NEUTROABS 4.3  HGB 12.8  HCT 42.2  MCV 84.9  PLT 292   No results for input(s): CKTOTAL,  CKMB, TROPONINI in the last 72 hours. Invalid input(s): POCBNP No results for input(s): HGBA1C in the last 72 hours.   Weights: Filed Weights   12/30/18 0348 12/30/18 0901 12/30/18 1139  Weight: 99.2 kg 100.8 kg 100.8 kg     Radiology/Studies:  DG Chest Portable 1 View  Result Date: 12/29/2018 CLINICAL DATA:  Shortness of breath EXAM: PORTABLE CHEST 1 VIEW COMPARISON:   10/18/2016 FINDINGS: Mild cardiomegaly. There is mild interstitial pulmonary edema. No focal consolidation. No pneumothorax or sizable pleural effusion. IMPRESSION: Cardiomegaly with mild interstitial pulmonary edema. Electronically Signed   By: Ulyses Jarred M.D.   On: 12/29/2018 01:27   ECHOCARDIOGRAM COMPLETE  Result Date: 12/29/2018   ECHOCARDIOGRAM REPORT   Patient Name:   Katelyn Lane Date of Exam: 12/29/2018 Medical Rec #:  [id number]     Height:       67.0 in Accession #:    [id number]    Weight:       225.0 lb Date of Birth:  Oct 07, 1955    BSA:          2.13 m Patient Age:    11 years      BP:           125/85 mmHg Patient Gender: F             HR:           61 bpm. Exam Location:  [facility] Procedure: 2D Echo, Cardiac Doppler and Color Doppler Indications:     CHF- acute diastolic [id number]  History:         Patient has no prior history of Echocardiogram examinations.                  Hyperlipidemia, renal disorder.  Sonographer:     [name] RDCS (AE) Referring Phys:  [id number] Arvella Merles MANSY Diagnosing Phys: Kathlyn Sacramento MD IMPRESSIONS  1. Left ventricular ejection fraction, by visual estimation, is <20%. The left ventricle has normal function. There is borderline left ventricular hypertrophy.  2. Left ventricular diastolic parameters are consistent with Grade III diastolic dysfunction (restrictive).  3. Severely dilated left ventricular internal cavity size.  4. The left ventricle demonstrates global hypokinesis.  5. Global right ventricle has normal systolic function.The right ventricular size is normal. No increase in right ventricular wall thickness.  6. Left atrial size was moderately dilated.  7. Right atrial size was normal.  8. Small pericardial effusion.  9. The pericardial effusion is posterior to the left ventricle. 10. The mitral valve is normal in structure. Mild to moderate mitral valve regurgitation. No evidence of mitral stenosis. 11. The tricuspid valve is normal in structure. Tricuspid  valve regurgitation is trivial. 12. The aortic valve is normal in structure. Aortic valve regurgitation is not visualized. No evidence of aortic valve sclerosis or stenosis. 13. The pulmonic valve was normal in structure. Pulmonic valve regurgitation is not visualized. 14. TR signal is inadequate for assessing pulmonary artery systolic pressure. FINDINGS  Left Ventricle: Left ventricular ejection fraction, by visual estimation, is <20%. The left ventricle has normal function. The left ventricle demonstrates global hypokinesis. The left ventricular internal cavity size was severely dilated left ventricle.  There is borderline left ventricular hypertrophy. Left ventricular diastolic parameters are consistent with Grade III diastolic dysfunction (restrictive). Normal left atrial pressure. Right Ventricle: The right ventricular size is normal. No increase in right ventricular wall thickness. Global RV systolic function is has normal systolic function. The tricuspid regurgitant velocity is 2.70  m/s, and with an assumed right atrial pressure  of 10 mmHg, the estimated right ventricular systolic pressure is TR signal is inadequate for assessing PA pressure at 39.1 mmHg. Left Atrium: Left atrial size was moderately dilated. Right Atrium: Right atrial size was normal in size Pericardium: A small pericardial effusion is present. The pericardial effusion is posterior to the left ventricle. Mitral Valve: The mitral valve is normal in structure. Mild to moderate mitral valve regurgitation. No evidence of mitral valve stenosis by observation. Tricuspid Valve: The tricuspid valve is normal in structure. Tricuspid valve regurgitation is trivial. Aortic Valve: The aortic valve is normal in structure. Aortic valve regurgitation is not visualized. The aortic valve is structurally normal, with no evidence of sclerosis or stenosis. Aortic valve mean gradient measures 5.0 mmHg. Aortic valve peak gradient measures 7.6 mmHg. Aortic valve  area, by VTI measures 1.97 cm. Pulmonic Valve: The pulmonic valve was normal in structure. Pulmonic valve regurgitation is not visualized. Pulmonic regurgitation is not visualized. Aorta: The aortic root, ascending aorta and aortic arch are all structurally normal, with no evidence of dilitation or obstruction. Venous: The inferior vena cava was not well visualized. IAS/Shunts: No atrial level shunt detected by color flow Doppler. There is no evidence of a patent foramen ovale. No ventricular septal defect is seen or detected. There is no evidence of an atrial septal defect.  LEFT VENTRICLE PLAX 2D LVIDd:         6.54 cm       Diastology LVIDs:         6.17 cm       LV e' lateral:   2.72 cm/s LV PW:         1.48 cm       LV E/e' lateral: 38.6 LV IVS:        0.85 cm       LV e' medial:    5.33 cm/s LVOT diam:     2.30 cm       LV E/e' medial:  19.7 LV SV:         27 ml LV SV Index:   12.15 LVOT Area:     4.15 cm  LV Volumes (MOD) LV area d, A2C:    62.40 cm LV area d, A4C:    55.70 cm LV area s, A2C:    52.10 cm LV area s, A4C:    47.50 cm LV major d, A2C:   8.92 cm LV major d, A4C:   8.54 cm LV major s, A2C:   8.40 cm LV major s, A4C:   8.18 cm LV vol d, MOD A2C: 362.0 ml LV vol d, MOD A4C: 303.0 ml LV vol s, MOD A2C: 272.0 ml LV vol s, MOD A4C: 228.0 ml LV SV MOD A2C:     90.0 ml LV SV MOD A4C:     303.0 ml LV SV MOD BP:      85.8 ml RIGHT VENTRICLE RV Basal diam:  3.26 cm RV S prime:     10.10 cm/s TAPSE (M-mode): 4.0 cm LEFT ATRIUM              Index       RIGHT ATRIUM           Index LA diam:        5.10 cm  2.40 cm/m  RA Area:     15.00 cm LA Vol (A2C):   132.0 ml 62.09 ml/m RA Volume:   42.30 ml  19.90 ml/m LA Vol (A4C):   57.9 ml  27.23 ml/m LA Biplane Vol: 91.0 ml  42.80 ml/m  AORTIC VALVE                    PULMONIC VALVE AV Area (Vmax):    1.56 cm     RVOT Peak grad: 3 mmHg AV Area (Vmean):   1.48 cm AV Area (VTI):     1.97 cm AV Vmax:           137.50 cm/s AV Vmean:          100.650 cm/s AV  VTI:            0.253 m AV Peak Grad:      7.6 mmHg AV Mean Grad:      5.0 mmHg LVOT Vmax:         51.70 cm/s LVOT Vmean:        35.900 cm/s LVOT VTI:          0.120 m LVOT/AV VTI ratio: 0.47  AORTA Ao Root diam: 2.70 cm MITRAL VALVE                         TRICUSPID VALVE MV Area (PHT): 3.77 cm              TR Peak grad:   29.1 mmHg MV PHT:        58.29 msec            TR Vmax:        270.00 cm/s MV Decel Time: 201 msec MV E velocity: 105.00 cm/s 103 cm/s  SHUNTS MV A velocity: 40.30 cm/s  70.3 cm/s Systemic VTI:  0.12 m MV E/A ratio:  2.61        1.5       Systemic Diam: 2.30 cm  Kathlyn Sacramento MD Electronically signed by Kathlyn Sacramento MD Signature Date/Time: 12/29/2018/1:55:43 PM    Final      Assessment and Recommendation  63 y.o. female with significant new onset congestive heart failure and cardiomyopathy seen by echocardiogram and cardiac catheterization with normal coronary arteries 1.  Change medication management for severe dilated cardiomyopathy including beta-blocker 2.  No use of ACE inhibitor due to allergy to ACE inhibitor Isosorbide hydralazine combination for blood pressure and cardiomyopathy treatment 3.  Possible use of spironolactone at low dose if able 4.  Continue furosemide intravenously for better volume status 5.  No further cardiac intervention and/or diagnostics necessary at this time 6.  Begin ambulation and begin cardiac rehabilitation  Signed, Serafina Royals M.D. FACC

## 2018-12-30 NOTE — Progress Notes (Addendum)
Pt. C/o CP, "7" on scale 1-10. Pt. States "it's aching a lot ." Pt. Med with 1 NTG SL now and then a xananx 0.5 mg p.o. Pt. Restless, moving legs a lot, breathing heavily. O2 on at 2L/[place]. Will continue to monitor pre cath.

## 2018-12-31 DIAGNOSIS — I160 Hypertensive urgency: Secondary | ICD-10-CM | POA: Diagnosis present

## 2018-12-31 DIAGNOSIS — F32A Depression, unspecified: Secondary | ICD-10-CM | POA: Diagnosis present

## 2018-12-31 DIAGNOSIS — R0789 Other chest pain: Secondary | ICD-10-CM | POA: Diagnosis present

## 2018-12-31 DIAGNOSIS — E785 Hyperlipidemia, unspecified: Secondary | ICD-10-CM | POA: Diagnosis present

## 2018-12-31 DIAGNOSIS — F329 Major depressive disorder, single episode, unspecified: Secondary | ICD-10-CM | POA: Diagnosis present

## 2018-12-31 DIAGNOSIS — I420 Dilated cardiomyopathy: Secondary | ICD-10-CM | POA: Diagnosis present

## 2018-12-31 LAB — BASIC METABOLIC PANEL
Anion gap: 13 (ref 5–15)
BUN: 54 mg/dL — ABNORMAL HIGH (ref 8–23)
CO2: 22 mmol/L (ref 22–32)
Calcium: 8.7 mg/dL — ABNORMAL LOW (ref 8.9–10.3)
Chloride: 104 mmol/L (ref 98–111)
Creatinine, Ser: 1.91 mg/dL — ABNORMAL HIGH (ref 0.44–1.00)
GFR calc Af Amer: 32 mL/min — ABNORMAL LOW (ref >60)
GFR calc non Af Amer: 27 mL/min — ABNORMAL LOW (ref >60)
Glucose, Bld: 112 mg/dL — ABNORMAL HIGH (ref 70–99)
Potassium: 3.6 mmol/L (ref 3.5–5.1)
Sodium: 139 mmol/L (ref 135–145)

## 2018-12-31 LAB — MAGNESIUM: Magnesium: 2.1 mg/dL (ref 1.7–2.4)

## 2018-12-31 NOTE — Progress Notes (Signed)
[facility] Encounter Note  Patient: Katelyn Lane / Admit Date: 12/29/2018 / Date of Encounter: 12/31/2018, 8:31 AM   Subjective: Patient feeling much better since admission.  Less shortness of breath although when lying flat she does have orthopnea still..  Patient has had significant improvements with IV Lasix.  Blood pressure is low at this point and will need further treatment with appropriate heart failure medications Cardiac catheterization showing severe dilated cardiomyopathy with ejection fraction of 15% Normal coronary arteries Elevated pulmonary and pulmonary capillary wedge pressures Echocardiogram showing a severe dilated cardiomyopathy and ejection fraction of 15%  Review of Systems: Positive for: Shortness of breath Negative for: Vision change, hearing change, syncope, dizziness, nausea, vomiting,diarrhea, bloody stool, stomach pain, cough, congestion, diaphoresis, urinary frequency, urinary pain,skin lesions, skin rashes Others previously listed  Objective: Telemetry: Normal sinus rhythm Physical Exam: Blood pressure 100/77, pulse (!) 56, temperature (!) 97.5 F (36.4 C), temperature source Oral, resp. rate 20, height 5\' 7"  (1.702 m), weight 100 kg, SpO2 96 %. Body mass index is 34.54 kg/m. General: Well developed, well nourished, in no acute distress. Head: Normocephalic, atraumatic, sclera non-icteric, no xanthomas, nares are without discharge. Neck: No apparent masses Lungs: Normal respirations with no wheezes, no rhonchi, basilar rales , few crackles   Heart: Regular rate and rhythm, normal S1 S2, no murmur, no rub, no gallop, PMI is normal size and placement, carotid upstroke normal without bruit, jugular venous pressure normal Abdomen: Soft, non-tender, non-distended with normoactive bowel sounds. No hepatosplenomegaly. Abdominal aorta is normal size without bruit Extremities: No edema, no clubbing, no cyanosis, no ulcers,  Peripheral: 2+  radial, 2+ femoral, 2+ dorsal pedal pulses Neuro: Alert and oriented. Moves all extremities spontaneously. Psych:  Responds to questions appropriately with a normal affect.   Intake/Output Summary (Last 24 hours) at 12/31/2018 0831 Last data filed at 12/31/2018 0137 Gross per 24 hour  Intake 480 ml  Output 2350 ml  Net -1870 ml    Inpatient Medications:  . aspirin EC  81 mg Oral Daily  . atorvastatin  20 mg Oral q1800  . carvedilol  3.125 mg Oral BID WC  . FLUoxetine  40 mg Oral Daily  . furosemide  40 mg Intravenous Q12H  . heparin injection (subcutaneous)  5,000 Units Subcutaneous BID  . isosorbide mononitrate  15 mg Oral Daily  . loratadine  10 mg Oral Daily  . sodium chloride flush  3 mL Intravenous Q12H  . sodium chloride flush  3 mL Intravenous Q12H  . spironolactone  12.5 mg Oral Daily   Infusions:  . sodium chloride      Labs: Recent Labs    12/30/18 0438 12/31/18 0401  NA 139 139  K 3.4* 3.6  CL 103 104  CO2 27 22  GLUCOSE 181* 112*  BUN 47* 54*  CREATININE 1.86* 1.91*  CALCIUM 8.8* 8.7*  MG  --  2.1   No results for input(s): AST, ALT, ALKPHOS, BILITOT, PROT, ALBUMIN in the last 72 hours. Recent Labs    12/29/18 0057  WBC 7.3  NEUTROABS 4.3  HGB 12.8  HCT 42.2  MCV 84.9  PLT 292   No results for input(s): CKTOTAL, CKMB, TROPONINI in the last 72 hours. Invalid input(s): POCBNP No results for input(s): HGBA1C in the last 72 hours.   Weights: Filed Weights   12/30/18 0901 12/30/18 1139 12/31/18 0331  Weight: 100.8 kg 100.8 kg 100 kg     Radiology/Studies:  CARDIAC CATHETERIZATION  Result Date:  12/30/2018  Mid RCA lesion is 10% stenosed.  Prox Cx lesion is 30% stenosed.  Hemodynamic findings consistent with moderate pulmonary hypertension.  63 year old female with hypertension hyperlipidemia having progressive episodes of shortness of breath PND orthopnea and congestive heart failure without evidence of myocardial infarction LV angiogram  showing severe dilated cardiomyopathy with ejection fraction of 15% Coronary angiogram showing minimal atherosclerosis of left circumflex and right coronary Significant elevation of left end-diastolic pressures, pulmonary pressures, pulmonary capillary wedge pressures Plan Continue intravenous Lasix for volume improvements of significant pulmonary edema and congestive heart failure No further cardiac intervention and/or diagnostics necessary at this time Begin beta-blocker, isosorbide, hydralazine, spironolactone for acute systolic dysfunction congestive heart failure No use of ACE inhibitor or angiotensin receptor blocker due to side effects and/or allergies Begin cardiac rehabilitation   DG Chest Portable 1 View  Result Date: 12/29/2018 CLINICAL DATA:  Shortness of breath EXAM: PORTABLE CHEST 1 VIEW COMPARISON:  10/18/2016 FINDINGS: Mild cardiomegaly. There is mild interstitial pulmonary edema. No focal consolidation. No pneumothorax or sizable pleural effusion. IMPRESSION: Cardiomegaly with mild interstitial pulmonary edema. Electronically Signed   By: Ulyses Jarred M.D.   On: 12/29/2018 01:27   ECHOCARDIOGRAM COMPLETE  Result Date: 12/29/2018   ECHOCARDIOGRAM REPORT   Patient Name:   Katelyn Lane Date of Exam: 12/29/2018 Medical Rec #:  [id number]     Height:       67.0 in Accession #:    [id number]    Weight:       225.0 lb Date of Birth:  December 07, 1955    BSA:          2.13 m Patient Age:    63 years      BP:           125/85 mmHg Patient Gender: F             HR:           61 bpm. Exam Location:  [facility] Procedure: 2D Echo, Cardiac Doppler and Color Doppler Indications:     CHF- acute diastolic [id number]  History:         Patient has no prior history of Echocardiogram examinations.                  Hyperlipidemia, renal disorder.  Sonographer:     [name] RDCS (AE) Referring Phys:  [id number] Arvella Merles MANSY Diagnosing Phys: Kathlyn Sacramento MD IMPRESSIONS  1. Left ventricular ejection fraction, by visual  estimation, is <20%. The left ventricle has normal function. There is borderline left ventricular hypertrophy.  2. Left ventricular diastolic parameters are consistent with Grade III diastolic dysfunction (restrictive).  3. Severely dilated left ventricular internal cavity size.  4. The left ventricle demonstrates global hypokinesis.  5. Global right ventricle has normal systolic function.The right ventricular size is normal. No increase in right ventricular wall thickness.  6. Left atrial size was moderately dilated.  7. Right atrial size was normal.  8. Small pericardial effusion.  9. The pericardial effusion is posterior to the left ventricle. 10. The mitral valve is normal in structure. Mild to moderate mitral valve regurgitation. No evidence of mitral stenosis. 11. The tricuspid valve is normal in structure. Tricuspid valve regurgitation is trivial. 12. The aortic valve is normal in structure. Aortic valve regurgitation is not visualized. No evidence of aortic valve sclerosis or stenosis. 13. The pulmonic valve was normal in structure. Pulmonic valve regurgitation is not visualized. 14. TR signal is inadequate for assessing pulmonary artery  systolic pressure. FINDINGS  Left Ventricle: Left ventricular ejection fraction, by visual estimation, is <20%. The left ventricle has normal function. The left ventricle demonstrates global hypokinesis. The left ventricular internal cavity size was severely dilated left ventricle.  There is borderline left ventricular hypertrophy. Left ventricular diastolic parameters are consistent with Grade III diastolic dysfunction (restrictive). Normal left atrial pressure. Right Ventricle: The right ventricular size is normal. No increase in right ventricular wall thickness. Global RV systolic function is has normal systolic function. The tricuspid regurgitant velocity is 2.70 m/s, and with an assumed right atrial pressure  of 10 mmHg, the estimated right ventricular systolic pressure  is TR signal is inadequate for assessing PA pressure at 39.1 mmHg. Left Atrium: Left atrial size was moderately dilated. Right Atrium: Right atrial size was normal in size Pericardium: A small pericardial effusion is present. The pericardial effusion is posterior to the left ventricle. Mitral Valve: The mitral valve is normal in structure. Mild to moderate mitral valve regurgitation. No evidence of mitral valve stenosis by observation. Tricuspid Valve: The tricuspid valve is normal in structure. Tricuspid valve regurgitation is trivial. Aortic Valve: The aortic valve is normal in structure. Aortic valve regurgitation is not visualized. The aortic valve is structurally normal, with no evidence of sclerosis or stenosis. Aortic valve mean gradient measures 5.0 mmHg. Aortic valve peak gradient measures 7.6 mmHg. Aortic valve area, by VTI measures 1.97 cm. Pulmonic Valve: The pulmonic valve was normal in structure. Pulmonic valve regurgitation is not visualized. Pulmonic regurgitation is not visualized. Aorta: The aortic root, ascending aorta and aortic arch are all structurally normal, with no evidence of dilitation or obstruction. Venous: The inferior vena cava was not well visualized. IAS/Shunts: No atrial level shunt detected by color flow Doppler. There is no evidence of a patent foramen ovale. No ventricular septal defect is seen or detected. There is no evidence of an atrial septal defect.  LEFT VENTRICLE PLAX 2D LVIDd:         6.54 cm       Diastology LVIDs:         6.17 cm       LV e' lateral:   2.72 cm/s LV PW:         1.48 cm       LV E/e' lateral: 38.6 LV IVS:        0.85 cm       LV e' medial:    5.33 cm/s LVOT diam:     2.30 cm       LV E/e' medial:  19.7 LV SV:         27 ml LV SV Index:   12.15 LVOT Area:     4.15 cm  LV Volumes (MOD) LV area d, A2C:    62.40 cm LV area d, A4C:    55.70 cm LV area s, A2C:    52.10 cm LV area s, A4C:    47.50 cm LV major d, A2C:   8.92 cm LV major d, A4C:   8.54 cm  LV major s, A2C:   8.40 cm LV major s, A4C:   8.18 cm LV vol d, MOD A2C: 362.0 ml LV vol d, MOD A4C: 303.0 ml LV vol s, MOD A2C: 272.0 ml LV vol s, MOD A4C: 228.0 ml LV SV MOD A2C:     90.0 ml LV SV MOD A4C:     303.0 ml LV SV MOD BP:      85.8 ml RIGHT VENTRICLE  RV Basal diam:  3.26 cm RV S prime:     10.10 cm/s TAPSE (M-mode): 4.0 cm LEFT ATRIUM              Index       RIGHT ATRIUM           Index LA diam:        5.10 cm  2.40 cm/m  RA Area:     15.00 cm LA Vol (A2C):   132.0 ml 62.09 ml/m RA Volume:   42.30 ml  19.90 ml/m LA Vol (A4C):   57.9 ml  27.23 ml/m LA Biplane Vol: 91.0 ml  42.80 ml/m  AORTIC VALVE                    PULMONIC VALVE AV Area (Vmax):    1.56 cm     RVOT Peak grad: 3 mmHg AV Area (Vmean):   1.48 cm AV Area (VTI):     1.97 cm AV Vmax:           137.50 cm/s AV Vmean:          100.650 cm/s AV VTI:            0.253 m AV Peak Grad:      7.6 mmHg AV Mean Grad:      5.0 mmHg LVOT Vmax:         51.70 cm/s LVOT Vmean:        35.900 cm/s LVOT VTI:          0.120 m LVOT/AV VTI ratio: 0.47  AORTA Ao Root diam: 2.70 cm MITRAL VALVE                         TRICUSPID VALVE MV Area (PHT): 3.77 cm              TR Peak grad:   29.1 mmHg MV PHT:        58.29 msec            TR Vmax:        270.00 cm/s MV Decel Time: 201 msec MV E velocity: 105.00 cm/s 103 cm/s  SHUNTS MV A velocity: 40.30 cm/s  70.3 cm/s Systemic VTI:  0.12 m MV E/A ratio:  2.61        1.5       Systemic Diam: 2.30 cm  Kathlyn Sacramento MD Electronically signed by Kathlyn Sacramento MD Signature Date/Time: 12/29/2018/1:55:43 PM    Final      Assessment and Recommendation  63 y.o. female with significant new onset congestive heart failure and cardiomyopathy seen by echocardiogram and cardiac catheterization with normal coronary arteries 1.  Continue carvedilol management for severe dilated cardiomyopathy but no increase in dose today due to bradycardia and relative hypotension 2.  No use of ACE inhibitor due to allergy to ACE  inhibitor Isosorbide hydralazine combination for blood pressure and cardiomyopathy treatment 3.  Continue use of spironolactone at low dose as able throughout today 4.  Continue furosemide intravenously for better volume status and convert to oral furosemide upon discharge 5.  No further cardiac intervention and/or diagnostics necessary at this time 6.  Begin ambulation and begin cardiac rehabilitation 7.  Follow throughout today and assess ability for discharge to home either later today or tomorrow with follow-up next week for further adjustments of medication  Signed, Serafina Royals M.D. FACC

## 2018-12-31 NOTE — TOC Progression Note (Signed)
Transition of Care [facility]) - Progression Note    Patient Details  Name: Katelyn Lane MRN: [id number] Date of Birth: November 18, 1955  Transition of Care [facility]) CM/SW [place], RN Phone Number: 12/31/2018, 12:11 PM  Clinical Narrative:     Spoke with patient regarding equipment use at home.  Patient reports having a scale to use and understands she should be weighing daily and keeping a log.  I was able to provide patient with a blood pressure cuff for home use.  Teaching on use of blood pressure cuff and need to check BP daily and at the same time occurred.  Patient was able to repeat this information back to me.    Patient denies need for other equipment.   Expected Discharge Plan: Home/Self Care Barriers to Discharge: Continued Medical Work up  Expected Discharge Plan and Services Expected Discharge Plan: Home/Self Care       Living arrangements for the past 2 months: Single Family Home                 DME Arranged: N/A DME Agency: NA         HH Agency: NA         Social Determinants of Health (SDOH) Interventions    Readmission Risk Interventions No flowsheet data found.

## 2018-12-31 NOTE — Progress Notes (Signed)
PROGRESS NOTE    Katelyn Lane  [id number] DOB: [birth year] DOA: 12/29/2018  PCP: [name], MD    LOS - 2   Brief Narrative:  63 y.o.African-American femalewith a known history of hypertension, dyslipidemia, depression and tobacco abuse, presented to the ED, with acute onset of worsening dyspnea over the last couple days,  with associated dry cough and occasional wheezing. She reported subjective fever/chills, some vomiting but no abdominal pain. Also reported two-pillow orthopnea, paroxysmal nocturnal dyspnea, and chest tightness with no radiation. In the ED, BP as high as 161/106, RR 26, spO2 91% on room air.  Labs notable for K 3, BUN 37, Cr 1.74 (was 1.54 in 2014).  BNP elevated at 767.  Hs-troponin mildly elevated at 53.  Lactate 2.  Portable CXR showed cardiomegaly with interstitial pulmonary edema.  ECG showed NSR 69 bpm with PAC's, LAE and lateral T wave inversions.  She was started on diuresis with IV Lasix and admitted to telemetry with cardiology consulted.  Echo showed EF of 15%.  Left heart cath 12/22 showed severe dilated cardiomyopathy with EF 15%.  No obstructive coronary disease.   Subjective 12/23: Patient seen this AM after she'd been walking the halls. She reports feeling well.  Does have dyspnea with exertion and orthopnea.  But at rest, no longer short of breath.  Denies other acute complaints including fever/chills, chest pain, N/V/D.  No acute events reported overnight.  Assessment & Plan:   Active Problems:   Dilated cardiomyopathy (HCC)   Hypertensive urgency   Chest tightness   Elevated troponin   Acute kidney injury superimposed on CKD (HCC)   Dyslipidemia   Depression   Acute CHF (congestive heart failure) (HCC)   Acute Decompensation of Systolic CHF Dilated Cardiomyopathy --cardiology following --continue IV diuresis for now --Coreg, Imdur, hydralazine, spironolactone for HTN and DCM --no ACEI due to allergy --cardiac rehab on  discharge --optimize volume status prior to discharge  Chest Pain/Tightness - resolved Elevated Troponin, present on admission --troponin likely up due to demand ischemia.  Normal coronaries on heart cath 12/22.  Hypertensive urgency - suspect due to volume overload vs noncompliance.  Resolved, now with borderline low BP after starting above medications. --stop amlodipine  - PRN IV labetalol and hydralazine  AKI superimposed on stage IIIb chronic kidney disease.  Suspect prerenal in setting of acute CHF.   -- monitor renal function closely with diuresis --hold ARB and HCTZ  Dyslipidemia -- continue Lipitor  Depression  -- continue Prozac and trazodone   DVT prophylaxis: heparin   Code Status: Full Code  Family Communication: none at bedside  Disposition Plan:  Expect discharge home in 1-2 more days, pending further diuresis and cardiology sign off.   Consultants:   Cardiology  Procedures:   Echo - EF 15%  Left Heart Cath - dilated cardiomyopathy.  Normal coronary arteries.  Antimicrobials:   None    Objective: Vitals:   12/31/18 0300 12/31/18 0331 12/31/18 0758 12/31/18 0903  BP: 103/72 106/78 100/77 114/70  Pulse: 63 (!) 53 (!) 56 (!) 57  Resp:  20 20   Temp:  97.7 F (36.5 C) (!) 97.5 F (36.4 C)   TempSrc:  Oral Oral   SpO2: 96% 99% 96%   Weight:  100 kg    Height:        Intake/Output Summary (Last 24 hours) at 12/31/2018 1524 Last data filed at 12/31/2018 1300 Gross per 24 hour  Intake 960 ml  Output 3350 ml  Net -2390 ml   Filed Weights   12/30/18 0901 12/30/18 1139 12/31/18 0331  Weight: 100.8 kg 100.8 kg 100 kg    Examination:  General exam: awake, alert, no acute distress HEENT: moist mucus membranes, hearing grossly normal  Respiratory system: clear to auscultation bilaterally, no wheezes, rales or rhonchi, normal respiratory effort. Cardiovascular system: normal S1/S2, RRR, no JVD, murmurs, rubs, gallops, no pedal  edema.   Central nervous system: alert and oriented x4. no gross focal neurologic deficits, normal speech Extremities: moves all, no edema, normal tone Skin: dry, intact, normal temperature Psychiatry: normal mood, congruent affect   Data Reviewed: I have personally reviewed following labs and imaging studies  CBC: Recent Labs  Lab 12/29/18 0057  WBC 7.3  NEUTROABS 4.3  HGB 12.8  HCT 42.2  MCV 84.9  PLT 123456   Basic Metabolic Panel: Recent Labs  Lab 12/29/18 0057 12/30/18 0438 12/31/18 0401  NA 139 139 139  K 3.0* 3.4* 3.6  CL 105 103 104  CO2 28 27 22   GLUCOSE 157* 181* 112*  BUN 37* 47* 54*  CREATININE 1.74* 1.86* 1.91*  CALCIUM 8.9 8.8* 8.7*  MG  --   --  2.1   GFR: Estimated Creatinine Clearance: 36.6 mL/min (A) (by C-G formula based on SCr of 1.91 mg/dL (H)). Liver Function Tests: No results for input(s): AST, ALT, ALKPHOS, BILITOT, PROT, ALBUMIN in the last 168 hours. No results for input(s): LIPASE, AMYLASE in the last 168 hours. No results for input(s): AMMONIA in the last 168 hours. Coagulation Profile: No results for input(s): INR, PROTIME in the last 168 hours. Cardiac Enzymes: No results for input(s): CKTOTAL, CKMB, CKMBINDEX, TROPONINI in the last 168 hours. BNP (last 3 results) No results for input(s): PROBNP in the last 8760 hours. HbA1C: No results for input(s): HGBA1C in the last 72 hours. CBG: No results for input(s): GLUCAP in the last 168 hours. Lipid Profile: No results for input(s): CHOL, HDL, LDLCALC, TRIG, CHOLHDL, LDLDIRECT in the last 72 hours. Thyroid Function Tests: No results for input(s): TSH, T4TOTAL, FREET4, T3FREE, THYROIDAB in the last 72 hours. Anemia Panel: No results for input(s): VITAMINB12, FOLATE, FERRITIN, TIBC, IRON, RETICCTPCT in the last 72 hours. Sepsis Labs: Recent Labs  Lab 12/29/18 0057 12/29/18 0327  LATICACIDVEN 2.0* 2.0*    Recent Results (from the past 240 hour(s))  SARS CORONAVIRUS 2 (TAT 6-24 HRS)  Nasopharyngeal Nasopharyngeal Swab     Status: None   Collection Time: 12/29/18  2:03 AM   Specimen: Nasopharyngeal Swab  Result Value Ref Range Status   SARS Coronavirus 2 NEGATIVE NEGATIVE Final    Comment: (NOTE) SARS-CoV-2 target nucleic acids are NOT DETECTED. The SARS-CoV-2 RNA is generally detectable in upper and lower respiratory specimens during the acute phase of infection. Negative results do not preclude SARS-CoV-2 infection, do not rule out co-infections with other pathogens, and should not be used as the sole basis for treatment or other patient management decisions. Negative results must be combined with clinical observations, patient history, and epidemiological information. The expected result is Negative. Fact Sheet for Patients: SugarRoll.be Fact Sheet for Healthcare Providers: https://www.woods-mathews.com/ This test is not yet approved or cleared by the [place] FDA and  has been authorized for detection and/or diagnosis of SARS-CoV-2 by FDA under an Emergency Use Authorization (EUA). This EUA will remain  in effect (meaning this test can be used) for the duration of the COVID-19 declaration under Section 56 4(b)(1) of the Act, 21 U.S.C. section  360bbb-3(b)(1), unless the authorization is terminated or revoked sooner. Performed at [facility], [place] [address] [place], [place] [address]          Radiology Studies: CARDIAC CATHETERIZATION  Result Date: 12/30/2018  Mid RCA lesion is 10% stenosed.  Prox Cx lesion is 30% stenosed.  Hemodynamic findings consistent with moderate pulmonary hypertension.  63 year old female with hypertension hyperlipidemia having progressive episodes of shortness of breath PND orthopnea and congestive heart failure without evidence of myocardial infarction LV angiogram showing severe dilated cardiomyopathy with ejection fraction of 15% Coronary angiogram showing minimal  atherosclerosis of left circumflex and right coronary Significant elevation of left end-diastolic pressures, pulmonary pressures, pulmonary capillary wedge pressures Plan Continue intravenous Lasix for volume improvements of significant pulmonary edema and congestive heart failure No further cardiac intervention and/or diagnostics necessary at this time Begin beta-blocker, isosorbide, hydralazine, spironolactone for acute systolic dysfunction congestive heart failure No use of ACE inhibitor or angiotensin receptor blocker due to side effects and/or allergies Begin cardiac rehabilitation        Scheduled Meds: . aspirin EC  81 mg Oral Daily  . atorvastatin  20 mg Oral q1800  . carvedilol  3.125 mg Oral BID WC  . FLUoxetine  40 mg Oral Daily  . furosemide  40 mg Intravenous Q12H  . heparin injection (subcutaneous)  5,000 Units Subcutaneous BID  . isosorbide mononitrate  15 mg Oral Daily  . loratadine  10 mg Oral Daily  . sodium chloride flush  3 mL Intravenous Q12H  . sodium chloride flush  3 mL Intravenous Q12H  . spironolactone  12.5 mg Oral Daily   Continuous Infusions: . sodium chloride       LOS: 2 days    Time spent: 30-35 minutes    Ezekiel Slocumb, DO [facility]   If 7PM-7AM, please contact night-coverage [email or web address] Password [email or web address] 12/31/2018, 3:24 PM

## 2018-12-31 NOTE — Evaluation (Signed)
Physical Therapy Evaluation and Discharge  Patient Details Name: Katelyn Lane MRN: [id number] DOB: [birth year] Today's Date: 12/31/2018   History of Present Illness  63 yo female admitted after presenting to ED with acute onset of worsening dyspnea with associated dry cough and occasional wheezing, chest tightnes graded 8/10 in severity with no radiation and also had blurred vision without HA or dizziness or paresthesias or focal muscle weakness, portable chest xray showed cardiomegaly wih mild interstitial pulmonary edema. pmh includes HTN, depression, tobacco abuse, dyslipidemia  Clinical Impression  Pt 63 yo female admitted for above. Pt received in bed with nursing present providing medications. Pt reports living in a single level home with 2 STE with Bil HR and living with a friend who is there almost 24/7. Pt has been ambulating to/from restroom in room independently. Pt mod I- supervision for all functional mobility. Pt ambulated 2 laps around nurses station and up/down 7 steps. Pt ambulated safely without assistive device with horizontal and vertical head turns, changes in speed, stepping over obstacles and pivot turn and stop without unsteadiness. HR in the 60s during ambulation, unable to get accurate SpO2 reading on portable pulse ox but pt not reporting feeling SOB and able to converse during ambulation. Pt scored 21 on DGI not indicating pt is at an increased risk of falls. This date, pt is supervision and mod I for all functional mobility. At this time, pt does not need further acute PT services. If any changes in mobility occur during acute hospital stay PT will reassess with new PT orders. Recommendation for outpatient cardiac rehab per MD recommendation as well.     Follow Up Recommendations Other (comment)(cardiac rehab)    Equipment Recommendations  None recommended by PT    Recommendations for Other Services       Precautions / Restrictions Precautions Precautions:  Fall Restrictions Weight Bearing Restrictions: No      Mobility  Bed Mobility Overal bed mobility: Modified Independent             General bed mobility comments: mod I to get EOB, no cuing or assist required, pt did not need to utilize increased time or effort or bed rails  Transfers Overall transfer level: Needs assistance Equipment used: None Transfers: Sit to/from Stand Sit to Stand: Supervision         General transfer comment: supervision for STS, no AD, pt steady with transfer from low bed and low toilet, has been getting up independently in room  Ambulation/Gait Ambulation/Gait assistance: Supervision Gait Distance (Feet): 400 Feet Assistive device: None Gait Pattern/deviations: WFL(Within Functional Limits)   Gait velocity interpretation: >2.62 ft/sec, indicative of community ambulatory General Gait Details: pt ambulated 2 laps around nurses, to/from restroom within room and to/from stairwell, pt steady with ambulation, pt reports walking frequently at home for exercise, pt reporting ambulating at baseline, no unsteadiness noted with vertical and horizontal head turns, stepping over obstacles, pivot turn and stop, changes in speed however only small changes in speed  Stairs Stairs: Yes Stairs assistance: Supervision Stair Management: One rail Right;Alternating pattern;Forwards Number of Stairs: 7 General stair comments: pt ascended/descended 7 steps with RHR, pt steady with stairs, alternating pattern ascending and descending  Wheelchair Mobility    Modified Rankin (Stroke Patients Only)       Balance                                 Standardized Balance  Assessment Standardized Balance Assessment : Dynamic Gait Index   Dynamic Gait Index Level Surface: Normal Change in Gait Speed: Mild Impairment Gait with Horizontal Head Turns: Normal Gait with Vertical Head Turns: Normal Gait and Pivot Turn: Mild Impairment Step Over Obstacle:  Normal Step Around Obstacles: Normal Steps: Mild Impairment Total Score: 21       Pertinent Vitals/Pain Pain Assessment: No/denies pain(mild discomfort at incision site during ambulation)    Home Living Family/patient expects to be discharged to:: Private residence Living Arrangements: Non-relatives/Friends(pt states she lives with a friend who is there basically 24/7) Available Help at Discharge: Friend(s)   Home Access: Stairs to enter Entrance Stairs-Rails: Can reach both Entrance Stairs-Number of Steps: 2 Home Layout: One level Home Equipment: Grab bars - tub/shower;Grab bars - toilet      Prior Function Level of Independence: Independent         Comments: pt reports independent with all ADLs, still driving including driving herself here to ED, denies any recent falls     Hand Dominance        Extremity/Trunk Assessment   Upper Extremity Assessment Upper Extremity Assessment: Overall WFL for tasks assessed    Lower Extremity Assessment Lower Extremity Assessment: Overall WFL for tasks assessed(grossly 4/5, MMTs not performed, based on functional mobility)       Communication   Communication: No difficulties  Cognition Arousal/Alertness: Awake/alert Behavior During Therapy: WFL for tasks assessed/performed Overall Cognitive Status: Within Functional Limits for tasks assessed                                        General Comments      Exercises Other Exercises Other Exercises: pt educated on standing LE therex at counter and encouraged to perform as HEP including standing heel raises, hip abd, hip ext and hamstring curls pt performed x10 reps bilaterally   Assessment/Plan    PT Assessment Patent does not need any further PT services;All further PT needs can be met in the next venue of care  PT Problem List Decreased strength;Decreased mobility;Cardiopulmonary status limiting activity;Decreased activity tolerance;Decreased balance        PT Treatment Interventions      PT Goals (Current goals can be found in the Care Plan section)  Acute Rehab PT Goals Patient Stated Goal: go home PT Goal Formulation: With patient Time For Goal Achievement: 01/14/19 Potential to Achieve Goals: Good    Frequency     Barriers to discharge        Co-evaluation               AM-PAC PT "6 Clicks" Mobility  Outcome Measure Help needed turning from your back to your side while in a flat bed without using bedrails?: None Help needed moving from lying on your back to sitting on the side of a flat bed without using bedrails?: None Help needed moving to and from a bed to a chair (including a wheelchair)?: None Help needed standing up from a chair using your arms (e.g., wheelchair or bedside chair)?: None Help needed to walk in hospital room?: None Help needed climbing 3-5 steps with a railing? : None 6 Click Score: 24    End of Session Equipment Utilized During Treatment: Gait belt Activity Tolerance: Patient tolerated treatment well Patient left: in chair;with call bell/phone within reach Nurse Communication: Mobility status PT Visit Diagnosis: Other abnormalities of gait and  mobility (R26.89)    Time: 6219-4712 PT Time Calculation (min) (ACUTE ONLY): 25 min   Charges:   PT Evaluation $PT Eval Moderate Complexity: 1 Mod PT Treatments $Therapeutic Exercise: 8-22 mins        Reilynn Lauro PT, DPT 10:02 AM,12/31/18 [phone]   Huckleberry Martinson Drucilla Chalet 12/31/2018, 9:56 AM

## 2019-01-01 LAB — BASIC METABOLIC PANEL
Anion gap: 12 (ref 5–15)
BUN: 51 mg/dL — ABNORMAL HIGH (ref 8–23)
CO2: 26 mmol/L (ref 22–32)
Calcium: 8.8 mg/dL — ABNORMAL LOW (ref 8.9–10.3)
Chloride: 101 mmol/L (ref 98–111)
Creatinine, Ser: 2.03 mg/dL — ABNORMAL HIGH (ref 0.44–1.00)
GFR calc Af Amer: 29 mL/min — ABNORMAL LOW (ref >60)
GFR calc non Af Amer: 25 mL/min — ABNORMAL LOW (ref >60)
Glucose, Bld: 199 mg/dL — ABNORMAL HIGH (ref 70–99)
Potassium: 3.6 mmol/L (ref 3.5–5.1)
Sodium: 139 mmol/L (ref 135–145)

## 2019-01-01 LAB — MAGNESIUM: Magnesium: 2 mg/dL (ref 1.7–2.4)

## 2019-01-01 MED ORDER — FUROSEMIDE 40 MG PO TABS: 40.0000 mg | ORAL_TABLET | Freq: Two times a day (BID) | ORAL | 1 refills | Status: DC

## 2019-01-01 MED ORDER — NITROGLYCERIN 0.4 MG SL SUBL: 0.4000 mg | SUBLINGUAL_TABLET | SUBLINGUAL | 1 refills | Status: DC | PRN

## 2019-01-01 MED ORDER — ISOSORBIDE MONONITRATE ER 30 MG PO TB24: 15.0000 mg | ORAL_TABLET | Freq: Every day | ORAL | 1 refills | Status: DC

## 2019-01-01 MED ORDER — LORATADINE 10 MG PO TABS: 10.0000 mg | ORAL_TABLET | Freq: Every day | ORAL | 1 refills | Status: DC

## 2019-01-01 MED ORDER — BUTALBITAL-APAP-CAFFEINE 50-325-40 MG PO TABS
1.0000 | ORAL_TABLET | Freq: Four times a day (QID) | ORAL | Status: DC | PRN
  Administered 2019-01-01: 1 via ORAL
  Filled 2019-01-01: qty 1

## 2019-01-01 MED ORDER — SPIRONOLACTONE 25 MG PO TABS: 12.5000 mg | ORAL_TABLET | Freq: Every day | ORAL | 1 refills | Status: DC

## 2019-01-01 MED ORDER — ASPIRIN 81 MG PO TBEC: 81.0000 mg | DELAYED_RELEASE_TABLET | Freq: Every day | ORAL | 1 refills | Status: DC

## 2019-01-01 MED ORDER — CARVEDILOL 3.125 MG PO TABS: 3.1250 mg | ORAL_TABLET | Freq: Two times a day (BID) | ORAL | Status: DC

## 2019-01-01 NOTE — Discharge Summary (Signed)
Physician Discharge Summary  Katelyn Lane [id number] DOB: [birth year] DOA: 12/29/2018  PCP: [name], MD  Admit date: 12/29/2018 Discharge date: 01/01/2019  Admitted From: Home Disposition:  Home  Recommendations for Outpatient Follow-up:  1. Follow up with PCP in 1-2 weeks 2. Please obtain BMP/CBC in one week 3. Please follow up with cardiology in 1 week   Home Health: No  Equipment/Devices: None   Discharge Condition: Stable  CODE STATUS: Full  Diet recommendation: Low sodium / heart healthy, 1.5 L fluid restriction  Brief/Interim Summary:  63 y.o.African-American femalewith a known history of hypertension, dyslipidemia, depression and tobacco abuse, presented to theED, with acute onset of worsening dyspnea over the last couple days,with associated dry cough and occasional wheezing. She reported subjective fever/chills, somevomitingbut noabdominal pain.Also reportedtwo-pillow orthopnea,paroxysmal nocturnal dyspnea, andchest tightness with no radiation.In the ED, BP as high as 161/106, RR 26, spO2 91% on room air. Labs notable for K 3, BUN 37, Cr 1.74 (was 1.54 in 2014). BNP elevated at 767. Hs-troponin mildly elevated at 53. Lactate 2. Portable CXR showed cardiomegaly with interstitial pulmonary edema. ECG showed NSR 69 bpm with PAC's, LAE and lateral T wave inversions. She was started on diuresis with IV Lasix and admitted to telemetry with cardiology consulted. Echo showed EF of 15%. Left heart cath 12/22 showed severe dilated cardiomyopathy with EF 15%. No obstructive coronary disease.  Patient was started on guideline-directed therapy for dilated cardiomyopathy and monitored for side effects and blood pressure stability.  Patient clinically improved and stable for discharge home today.  She was counseled regarding limiting sodium and fluid intake, monitoring BP at home, educated how to use her BP monitor.  She expresses understanding and  agrees that she will follow up with cardiology and PCP next week.     Discharge Diagnoses: Active Problems:   Dilated cardiomyopathy (HCC)   Hypertensive urgency   Chest tightness   Elevated troponin   Acute kidney injury superimposed on CKD (HCC)   Dyslipidemia   Depression   Acute CHF (congestive heart failure) [facility])    Discharge Instructions   Discharge Instructions    (HEART FAILURE PATIENTS) Call MD:  Anytime you have any of the following symptoms: 1) 3 pound weight gain in 24 hours or 5 pounds in 1 week 2) shortness of breath, with or without a dry hacking cough 3) swelling in the hands, feet or stomach 4) if you have to sleep on extra pillows at night in order to breathe.   Complete by: As directed    AMB Referral to Cardiac Rehabilitation - Phase II   Complete by: As directed    Diagnosis: Heart Failure (see criteria below if ordering Phase II)   Heart Failure Type: Chronic Systolic   Call MD for:  temperature >100.4   Complete by: As directed    Diet - low sodium heart healthy   Complete by: As directed    Less than 2000 mg sodium daily Less than 1.5L fluid daily   Discharge instructions   Complete by: As directed    Take your blood pressure 2 times each day, morning and evening. Write down your blood pressures and bring to your follow up doctor's appointments.  If your top BP number is LESS than 100 or the bottom BP number less than 60, then do not take the medication and call your doctor to let them know your blood pressure is running low.    It's okay for your top BP number to be  in the 90's or low 100's, but taking more medicine will make it drop too low.  Please follow up with cardiology and PCP next week if possible.   Increase activity slowly   Complete by: As directed      Allergies as of 01/01/2019      Reactions   Penicillins Hives, Other (See Comments), Rash   Ace Inhibitors Nausea And Vomiting      Medication List    STOP taking these  medications   amLODipine 10 MG tablet Commonly known as: NORVASC   hydrochlorothiazide 25 MG tablet Commonly known as: HYDRODIURIL     TAKE these medications   aspirin 81 MG EC tablet Take 1 tablet (81 mg total) by mouth daily. Start taking on: January 02, 2019   atorvastatin 20 MG tablet Commonly known as: LIPITOR Take 20 mg by mouth daily at 6 PM.   carvedilol 3.125 MG tablet Commonly known as: COREG Take 1 tablet (3.125 mg total) by mouth 2 (two) times daily with a meal.   FLUoxetine 40 MG capsule Commonly known as: PROZAC Take 40 mg by mouth daily.   furosemide 40 MG tablet Commonly known as: Lasix Take 1 tablet (40 mg total) by mouth 2 (two) times daily.   isosorbide mononitrate 30 MG 24 hr tablet Commonly known as: IMDUR Take 0.5 tablets (15 mg total) by mouth daily. Start taking on: January 02, 2019   loratadine 10 MG tablet Commonly known as: CLARITIN Take 1 tablet (10 mg total) by mouth daily. Start taking on: January 02, 2019   nitroGLYCERIN 0.4 MG SL tablet Commonly known as: NITROSTAT Place 1 tablet (0.4 mg total) under the tongue every 5 (five) minutes as needed for chest pain.   spironolactone 25 MG tablet Commonly known as: ALDACTONE Take 0.5 tablets (12.5 mg total) by mouth daily. Start taking on: January 02, 2019   traZODone 100 MG tablet Commonly known as: DESYREL Take 150 mg by mouth at bedtime as needed.      Follow-up Information    [facility] Follow up on 01/08/2019.   Specialty: Cardiology Why: at [place] through the [facility] [facility] Contact information: [address] [place] [phone]       [facility] Follow up.   Specialty: Cardiac Rehabilitation Why: Your Cardiologist has referred you to outpatient Cardiac Rehab at [facility].  The Cardiac Rehab department will contact you by phone within one to two weeks of  discharge to schedule your first appointment.   Contact information: [place] [id number] [address] [place]       Revelo, Elyse Jarvis, MD. Schedule an appointment as soon as possible for a visit in 1 week(s).   Specialty: Family Medicine Contact information: [address] [place] [address] [phone]        Corey Skains, MD. Schedule an appointment as soon as possible for a visit in 1 week(s).   Specialty: Cardiology Contact information: [facility] [place] [address] [phone]          Allergies  Allergen Reactions  . Penicillins Hives, Other (See Comments) and Rash  . Ace Inhibitors Nausea And Vomiting    Consultations:  Cardiology, Dr. Nehemiah Massed    Procedures/Studies: CARDIAC CATHETERIZATION  Result Date: 12/30/2018  Mid RCA lesion is 10% stenosed.  Prox Cx lesion is 30% stenosed.  Hemodynamic findings consistent  with moderate pulmonary hypertension.  63 year old female with hypertension hyperlipidemia having progressive episodes of shortness of breath PND orthopnea and congestive heart failure without evidence of myocardial infarction LV angiogram showing severe dilated cardiomyopathy with ejection fraction of 15% Coronary angiogram showing minimal atherosclerosis of left circumflex and right coronary Significant elevation of left end-diastolic pressures, pulmonary pressures, pulmonary capillary wedge pressures Plan Continue intravenous Lasix for volume improvements of significant pulmonary edema and congestive heart failure No further cardiac intervention and/or diagnostics necessary at this time Begin beta-blocker, isosorbide, hydralazine, spironolactone for acute systolic dysfunction congestive heart failure No use of ACE inhibitor or angiotensin receptor blocker due to side effects and/or allergies Begin cardiac rehabilitation   DG Chest Portable 1  View  Result Date: 12/29/2018 CLINICAL DATA:  Shortness of breath EXAM: PORTABLE CHEST 1 VIEW COMPARISON:  10/18/2016 FINDINGS: Mild cardiomegaly. There is mild interstitial pulmonary edema. No focal consolidation. No pneumothorax or sizable pleural effusion. IMPRESSION: Cardiomegaly with mild interstitial pulmonary edema. Electronically Signed   By: Ulyses Jarred M.D.   On: 12/29/2018 01:27   ECHOCARDIOGRAM COMPLETE  Result Date: 12/29/2018   ECHOCARDIOGRAM REPORT   Patient Name:   Katelyn Lane Date of Exam: 12/29/2018 Medical Rec #:  [id number]     Height:       67.0 in Accession #:    [id number]    Weight:       225.0 lb Date of Birth:  Apr 25, 1955    BSA:          2.13 m Patient Age:    63 years      BP:           125/85 mmHg Patient Gender: F             HR:           61 bpm. Exam Location:  [facility] Procedure: 2D Echo, Cardiac Doppler and Color Doppler Indications:     CHF- acute diastolic [id number]  History:         Patient has no prior history of Echocardiogram examinations.                  Hyperlipidemia, renal disorder.  Sonographer:     [name] RDCS (AE) Referring Phys:  [id number] Arvella Merles MANSY Diagnosing Phys: Kathlyn Sacramento MD IMPRESSIONS  1. Left ventricular ejection fraction, by visual estimation, is <20%. The left ventricle has normal function. There is borderline left ventricular hypertrophy.  2. Left ventricular diastolic parameters are consistent with Grade III diastolic dysfunction (restrictive).  3. Severely dilated left ventricular internal cavity size.  4. The left ventricle demonstrates global hypokinesis.  5. Global right ventricle has normal systolic function.The right ventricular size is normal. No increase in right ventricular wall thickness.  6. Left atrial size was moderately dilated.  7. Right atrial size was normal.  8. Small pericardial effusion.  9. The pericardial effusion is posterior to the left ventricle. 10. The mitral valve is normal in structure. Mild to moderate mitral  valve regurgitation. No evidence of mitral stenosis. 11. The tricuspid valve is normal in structure. Tricuspid valve regurgitation is trivial. 12. The aortic valve is normal in structure. Aortic valve regurgitation is not visualized. No evidence of aortic valve sclerosis or stenosis. 13. The pulmonic valve was normal in structure. Pulmonic valve regurgitation is not visualized. 14. TR signal is inadequate for assessing pulmonary artery systolic pressure. FINDINGS  Left Ventricle: Left ventricular ejection fraction, by visual estimation, is <20%. The left ventricle has  normal function. The left ventricle demonstrates global hypokinesis. The left ventricular internal cavity size was severely dilated left ventricle.  There is borderline left ventricular hypertrophy. Left ventricular diastolic parameters are consistent with Grade III diastolic dysfunction (restrictive). Normal left atrial pressure. Right Ventricle: The right ventricular size is normal. No increase in right ventricular wall thickness. Global RV systolic function is has normal systolic function. The tricuspid regurgitant velocity is 2.70 m/s, and with an assumed right atrial pressure  of 10 mmHg, the estimated right ventricular systolic pressure is TR signal is inadequate for assessing PA pressure at 39.1 mmHg. Left Atrium: Left atrial size was moderately dilated. Right Atrium: Right atrial size was normal in size Pericardium: A small pericardial effusion is present. The pericardial effusion is posterior to the left ventricle. Mitral Valve: The mitral valve is normal in structure. Mild to moderate mitral valve regurgitation. No evidence of mitral valve stenosis by observation. Tricuspid Valve: The tricuspid valve is normal in structure. Tricuspid valve regurgitation is trivial. Aortic Valve: The aortic valve is normal in structure. Aortic valve regurgitation is not visualized. The aortic valve is structurally normal, with no evidence of sclerosis or  stenosis. Aortic valve mean gradient measures 5.0 mmHg. Aortic valve peak gradient measures 7.6 mmHg. Aortic valve area, by VTI measures 1.97 cm. Pulmonic Valve: The pulmonic valve was normal in structure. Pulmonic valve regurgitation is not visualized. Pulmonic regurgitation is not visualized. Aorta: The aortic root, ascending aorta and aortic arch are all structurally normal, with no evidence of dilitation or obstruction. Venous: The inferior vena cava was not well visualized. IAS/Shunts: No atrial level shunt detected by color flow Doppler. There is no evidence of a patent foramen ovale. No ventricular septal defect is seen or detected. There is no evidence of an atrial septal defect.  LEFT VENTRICLE PLAX 2D LVIDd:         6.54 cm       Diastology LVIDs:         6.17 cm       LV e' lateral:   2.72 cm/s LV PW:         1.48 cm       LV E/e' lateral: 38.6 LV IVS:        0.85 cm       LV e' medial:    5.33 cm/s LVOT diam:     2.30 cm       LV E/e' medial:  19.7 LV SV:         27 ml LV SV Index:   12.15 LVOT Area:     4.15 cm  LV Volumes (MOD) LV area d, A2C:    62.40 cm LV area d, A4C:    55.70 cm LV area s, A2C:    52.10 cm LV area s, A4C:    47.50 cm LV major d, A2C:   8.92 cm LV major d, A4C:   8.54 cm LV major s, A2C:   8.40 cm LV major s, A4C:   8.18 cm LV vol d, MOD A2C: 362.0 ml LV vol d, MOD A4C: 303.0 ml LV vol s, MOD A2C: 272.0 ml LV vol s, MOD A4C: 228.0 ml LV SV MOD A2C:     90.0 ml LV SV MOD A4C:     303.0 ml LV SV MOD BP:      85.8 ml RIGHT VENTRICLE RV Basal diam:  3.26 cm RV S prime:     10.10 cm/s TAPSE (M-mode): 4.0 cm LEFT  ATRIUM              Index       RIGHT ATRIUM           Index LA diam:        5.10 cm  2.40 cm/m  RA Area:     15.00 cm LA Vol (A2C):   132.0 ml 62.09 ml/m RA Volume:   42.30 ml  19.90 ml/m LA Vol (A4C):   57.9 ml  27.23 ml/m LA Biplane Vol: 91.0 ml  42.80 ml/m  AORTIC VALVE                    PULMONIC VALVE AV Area (Vmax):    1.56 cm     RVOT Peak grad: 3 mmHg AV  Area (Vmean):   1.48 cm AV Area (VTI):     1.97 cm AV Vmax:           137.50 cm/s AV Vmean:          100.650 cm/s AV VTI:            0.253 m AV Peak Grad:      7.6 mmHg AV Mean Grad:      5.0 mmHg LVOT Vmax:         51.70 cm/s LVOT Vmean:        35.900 cm/s LVOT VTI:          0.120 m LVOT/AV VTI ratio: 0.47  AORTA Ao Root diam: 2.70 cm MITRAL VALVE                         TRICUSPID VALVE MV Area (PHT): 3.77 cm              TR Peak grad:   29.1 mmHg MV PHT:        58.29 msec            TR Vmax:        270.00 cm/s MV Decel Time: 201 msec MV E velocity: 105.00 cm/s 103 cm/s  SHUNTS MV A velocity: 40.30 cm/s  70.3 cm/s Systemic VTI:  0.12 m MV E/A ratio:  2.61        1.5       Systemic Diam: 2.30 cm  Kathlyn Sacramento MD Electronically signed by Kathlyn Sacramento MD Signature Date/Time: 12/29/2018/1:55:43 PM    Final       Echo - EF 15%  Left Heart Cath - dilated cardiomyopathy. Normal coronary arteries.   Subjective: Patient sleeping when seen this AM.  This afternoon, has a headache not relieved by tylenol.  Given Fioricet.  She reports improved breathing, still a little SOB on exertion, but bette.  No other acute complaints.  We spent time discussing new medications, stopping old BP meds, monitoring BP at home.     Discharge Exam: Vitals:   01/01/19 0729 01/01/19 1522  BP: 111/75 104/75  Pulse: 69 68  Resp: 19 18  Temp: 98.7 F (37.1 C) 98.6 F (37 C)  SpO2: 97% 100%   Vitals:   12/31/18 1959 01/01/19 0546 01/01/19 0729 01/01/19 1522  BP: 119/75 (!) 120/95 111/75 104/75  Pulse: 69 65 69 68  Resp: 18 19 19 18   Temp: 97.6 F (36.4 C) 98.1 F (36.7 C) 98.7 F (37.1 C) 98.6 F (37 C)  TempSrc: Oral Oral Oral   SpO2: 98% 99% 97% 100%  Weight:      Height:  General: Pt is alert, awake, not in acute distress Cardiovascular: RRR, S1/S2 +, no rubs, no gallops Respiratory: CTA bilaterally, no wheezing, no rhonchi Abdominal: Soft, NT, ND, bowel sounds + Extremities: no edema, no  cyanosis    The results of significant diagnostics from this hospitalization (including imaging, microbiology, ancillary and laboratory) are listed below for reference.     Microbiology: Recent Results (from the past 240 hour(s))  SARS CORONAVIRUS 2 (TAT 6-24 HRS) Nasopharyngeal Nasopharyngeal Swab     Status: None   Collection Time: 12/29/18  2:03 AM   Specimen: Nasopharyngeal Swab  Result Value Ref Range Status   SARS Coronavirus 2 NEGATIVE NEGATIVE Final    Comment: (NOTE) SARS-CoV-2 target nucleic acids are NOT DETECTED. The SARS-CoV-2 RNA is generally detectable in upper and lower respiratory specimens during the acute phase of infection. Negative results do not preclude SARS-CoV-2 infection, do not rule out co-infections with other pathogens, and should not be used as the sole basis for treatment or other patient management decisions. Negative results must be combined with clinical observations, patient history, and epidemiological information. The expected result is Negative. Fact Sheet for Patients: SugarRoll.be Fact Sheet for Healthcare Providers: https://www.woods-mathews.com/ This test is not yet approved or cleared by the [place] FDA and  has been authorized for detection and/or diagnosis of SARS-CoV-2 by FDA under an Emergency Use Authorization (EUA). This EUA will remain  in effect (meaning this test can be used) for the duration of the COVID-19 declaration under Section 56 4(b)(1) of the Act, 21 U.S.C. section 360bbb-3(b)(1), unless the authorization is terminated or revoked sooner. Performed at [facility], [place] [address], [place], [place] [address]      Labs: BNP (last 3 results) Recent Labs    12/29/18 0057  BNP [id number]*   Basic Metabolic Panel: Recent Labs  Lab 12/29/18 0057 12/30/18 0438 12/31/18 0401 01/01/19 0348  NA 139 139 139 139  K 3.0* 3.4* 3.6 3.6  CL 105 103 104 101  CO2 28 27 22  26   GLUCOSE 157* 181* 112* 199*  BUN 37* 47* 54* 51*  CREATININE 1.74* 1.86* 1.91* 2.03*  CALCIUM 8.9 8.8* 8.7* 8.8*  MG  --   --  2.1 2.0   Liver Function Tests: No results for input(s): AST, ALT, ALKPHOS, BILITOT, PROT, ALBUMIN in the last 168 hours. No results for input(s): LIPASE, AMYLASE in the last 168 hours. No results for input(s): AMMONIA in the last 168 hours. CBC: Recent Labs  Lab 12/29/18 0057  WBC 7.3  NEUTROABS 4.3  HGB 12.8  HCT 42.2  MCV 84.9  PLT 292   Cardiac Enzymes: No results for input(s): CKTOTAL, CKMB, CKMBINDEX, TROPONINI in the last 168 hours. BNP: Invalid input(s): POCBNP CBG: No results for input(s): GLUCAP in the last 168 hours. D-Dimer No results for input(s): DDIMER in the last 72 hours. Hgb A1c No results for input(s): HGBA1C in the last 72 hours. Lipid Profile No results for input(s): CHOL, HDL, LDLCALC, TRIG, CHOLHDL, LDLDIRECT in the last 72 hours. Thyroid function studies No results for input(s): TSH, T4TOTAL, T3FREE, THYROIDAB in the last 72 hours.  Invalid input(s): FREET3 Anemia work up No results for input(s): VITAMINB12, FOLATE, FERRITIN, TIBC, IRON, RETICCTPCT in the last 72 hours. Urinalysis No results found for: COLORURINE, APPEARANCEUR, LABSPEC, [place], GLUCOSEU, [place], [place], KETONESUR, PROTEINUR, UROBILINOGEN, NITRITE, LEUKOCYTESUR Sepsis Labs Invalid input(s): PROCALCITONIN,  WBC,  LACTICIDVEN Microbiology Recent Results (from the past 240 hour(s))  SARS CORONAVIRUS 2 (TAT 6-24  HRS) Nasopharyngeal Nasopharyngeal Swab     Status: None   Collection Time: 12/29/18  2:03 AM   Specimen: Nasopharyngeal Swab  Result Value Ref Range Status   SARS Coronavirus 2 NEGATIVE NEGATIVE Final    Comment: (NOTE) SARS-CoV-2 target nucleic acids are NOT DETECTED. The SARS-CoV-2 RNA is generally detectable in upper and lower respiratory specimens during the acute phase of infection. Negative results do not preclude SARS-CoV-2  infection, do not rule out co-infections with other pathogens, and should not be used as the sole basis for treatment or other patient management decisions. Negative results must be combined with clinical observations, patient history, and epidemiological information. The expected result is Negative. Fact Sheet for Patients: SugarRoll.be Fact Sheet for Healthcare Providers: https://www.woods-mathews.com/ This test is not yet approved or cleared by the [place] FDA and  has been authorized for detection and/or diagnosis of SARS-CoV-2 by FDA under an Emergency Use Authorization (EUA). This EUA will remain  in effect (meaning this test can be used) for the duration of the COVID-19 declaration under Section 56 4(b)(1) of the Act, 21 U.S.C. section 360bbb-3(b)(1), unless the authorization is terminated or revoked sooner. Performed at [facility], [place] [address] [place], [name] [address]      Time coordinating discharge: Over 30 minutes  SIGNED:   Ezekiel Slocumb, DO [facility] 01/01/2019, 4:14 PM   If 7PM-7AM, please contact night-coverage [email or web address] Password [email or web address]

## 2019-01-01 NOTE — Progress Notes (Signed)
Discharge instructions explained to pt/ verbalized an understanding / iv and tele removed/ transported off unit via wheelchair.  

## 2019-01-05 ENCOUNTER — Telehealth: Payer: Self-pay | Admitting: Family

## 2019-01-05 NOTE — Telephone Encounter (Signed)
Spoke with Katelyn Lane regarding her new patient appointment we made after we received a referral from ED since her recent hospital D/C. Pt stated she was doing well but still trying to take it easy. She states she does not have a scale  And doesn't keep up with weight, she does not follow a low sodium diet and eats mostly frozen meals. She doesn't thing she has any issues with medications and is taking them as prescribed by physician. She is having Edema in her legs but no other complaints. PT cant confirm her new pt appointment scheduled with [name]. She stated we would take the day before.    [place], CNA  01/05/2019

## 2019-01-08 ENCOUNTER — Ambulatory Visit: Payer: Medicaid Other | Admitting: Family

## 2019-01-13 ENCOUNTER — Emergency Department
Admission: EM | Admit: 2019-01-13 | Discharge: 2019-01-14 | Disposition: A | Discharge status: Home / Self Care | Payer: Medicaid Other | Attending: Student | Admitting: Student

## 2019-01-13 ENCOUNTER — Emergency Department: Payer: Medicaid Other

## 2019-01-13 ENCOUNTER — Other Ambulatory Visit: Payer: Self-pay

## 2019-01-13 DIAGNOSIS — F17210 Nicotine dependence, cigarettes, uncomplicated: Secondary | ICD-10-CM | POA: Diagnosis not present

## 2019-01-13 DIAGNOSIS — Z7982 Long term (current) use of aspirin: Secondary | ICD-10-CM | POA: Insufficient documentation

## 2019-01-13 DIAGNOSIS — R0602 Shortness of breath: Secondary | ICD-10-CM | POA: Insufficient documentation

## 2019-01-13 DIAGNOSIS — Z20822 Contact with and (suspected) exposure to covid-19: Secondary | ICD-10-CM | POA: Insufficient documentation

## 2019-01-13 DIAGNOSIS — I509 Heart failure, unspecified: Secondary | ICD-10-CM | POA: Diagnosis not present

## 2019-01-13 DIAGNOSIS — F1490 Cocaine use, unspecified, uncomplicated: Secondary | ICD-10-CM

## 2019-01-13 HISTORY — DX: Heart failure, unspecified: I50.9

## 2019-01-13 LAB — BASIC METABOLIC PANEL
Anion gap: 8 (ref 5–15)
BUN: 37 mg/dL — ABNORMAL HIGH (ref 8–23)
CO2: 22 mmol/L (ref 22–32)
Calcium: 8.8 mg/dL — ABNORMAL LOW (ref 8.9–10.3)
Chloride: 111 mmol/L (ref 98–111)
Creatinine, Ser: 2.1 mg/dL — ABNORMAL HIGH (ref 0.44–1.00)
GFR calc Af Amer: 28 mL/min — ABNORMAL LOW (ref >60)
GFR calc non Af Amer: 24 mL/min — ABNORMAL LOW (ref >60)
Glucose, Bld: 151 mg/dL — ABNORMAL HIGH (ref 70–99)
Potassium: 3.9 mmol/L (ref 3.5–5.1)
Sodium: 141 mmol/L (ref 135–145)

## 2019-01-13 LAB — CBC
HCT: 40.3 % (ref 36.0–46.0)
Hemoglobin: 12.2 g/dL (ref 12.0–15.0)
MCH: 26.2 pg (ref 26.0–34.0)
MCHC: 30.3 g/dL (ref 30.0–36.0)
MCV: 86.7 fL (ref 80.0–100.0)
Platelets: 371 10*3/uL (ref 150–400)
RBC: 4.65 MIL/uL (ref 3.87–5.11)
RDW: 16.4 % — ABNORMAL HIGH (ref 11.5–15.5)
WBC: 5.6 10*3/uL (ref 4.0–10.5)
nRBC: 0 % (ref 0.0–0.2)

## 2019-01-13 LAB — TROPONIN I (HIGH SENSITIVITY): Troponin I (High Sensitivity): 69 ng/L — ABNORMAL HIGH (ref <18)

## 2019-01-13 MED ORDER — FUROSEMIDE 10 MG/ML IJ SOLN
40.0000 mg | Freq: Once | INTRAMUSCULAR | Status: AC
  Administered 2019-01-13: 40 mg via INTRAVENOUS
  Filled 2019-01-13: qty 4

## 2019-01-13 MED ORDER — NITROGLYCERIN 0.4 MG SL SUBL
0.4000 mg | SUBLINGUAL_TABLET | SUBLINGUAL | Status: DC | PRN
  Administered 2019-01-13 – 2019-01-14 (×2): 0.4 mg via SUBLINGUAL
  Filled 2019-01-13 (×2): qty 1

## 2019-01-13 NOTE — ED Triage Notes (Signed)
Pt arrives to ED with SOB. Shallow quick respirations noted. Labored breathing. Speaking in short sentences. A&O. 93% on RA. States has CHF. Pt unable to sit still.

## 2019-01-13 NOTE — ED Notes (Signed)
X-ray at bedside

## 2019-01-13 NOTE — ED Provider Notes (Addendum)
[facility] Emergency Department Provider Note  ____________________________________________   First MD Initiated Contact with Patient 01/13/19 2228     (approximate)  I have reviewed the triage vital signs and the nursing notes.  History  Chief Complaint Shortness of Breath    HPI Katelyn Lane is a 64 y.o. female with hx of severe dilated cardiomyopathy (EF 15%) who presents for SOB. Patient recently admitted from 12/21 to 12/24 for SOB, found to have new diagnosis dilated cardiomyopathy by catheterization, with EF 15%.  She reports shortness of breath started yesterday, and has been progressively worsening since then.  Particularly worse with exertion. No alleviating factors. No radiation.  Reports compliance with her discharge medications.  Denies significant weight gain.  Does report a non-productive cough.  Denies any fevers, vomiting, diarrhea, or sick contacts.   Past Medical Hx Past Medical History:  Diagnosis Date  . CHF (congestive heart failure) ([place])   . Hyperlipidemia   . Renal disorder     Problem List Patient Active Problem List   Diagnosis Date Noted  . Dilated cardiomyopathy ([place]) 12/31/2018  . Chest tightness 12/31/2018  . Elevated troponin 12/31/2018  . Hypertensive urgency 12/31/2018  . Acute kidney injury superimposed on CKD ([place]) 12/31/2018  . Dyslipidemia 12/31/2018  . Depression 12/31/2018  . Acute CHF (congestive heart failure) ([place]) 12/29/2018    Past Surgical Hx Past Surgical History:  Procedure Laterality Date  . RIGHT/LEFT HEART CATH AND CORONARY ANGIOGRAPHY N/A 12/30/2018   Procedure: RIGHT/LEFT HEART CATH AND CORONARY ANGIOGRAPHY;  Surgeon: Corey Skains, MD;  Location: [place] [facility];  Service: Cardiovascular;  Laterality: N/A;    Medications Prior to Admission medications   Medication Sig Start Date End Date Taking? Authorizing Provider  aspirin 81 MG EC tablet Take 1 tablet (81 mg total) by mouth  daily. 01/02/19   Ezekiel Slocumb, DO  atorvastatin (LIPITOR) 20 MG tablet Take 20 mg by mouth daily at 6 PM.     [provider]  carvedilol (COREG) 3.125 MG tablet Take 1 tablet (3.125 mg total) by mouth 2 (two) times daily with a meal. 01/01/19   Nicole Kindred A, DO  FLUoxetine (PROZAC) 40 MG capsule Take 40 mg by mouth daily.     [provider]  furosemide (LASIX) 40 MG tablet Take 1 tablet (40 mg total) by mouth 2 (two) times daily. 01/01/19 03/02/19  Ezekiel Slocumb, DO  isosorbide mononitrate (IMDUR) 30 MG 24 hr tablet Take 0.5 tablets (15 mg total) by mouth daily. 01/02/19   Ezekiel Slocumb, DO  loratadine (CLARITIN) 10 MG tablet Take 1 tablet (10 mg total) by mouth daily. 01/02/19   Ezekiel Slocumb, DO  nitroGLYCERIN (NITROSTAT) 0.4 MG SL tablet Place 1 tablet (0.4 mg total) under the tongue every 5 (five) minutes as needed for chest pain. 01/01/19   Ezekiel Slocumb, DO  spironolactone (ALDACTONE) 25 MG tablet Take 0.5 tablets (12.5 mg total) by mouth daily. 01/02/19   Ezekiel Slocumb, DO  traZODone (DESYREL) 100 MG tablet Take 150 mg by mouth at bedtime as needed. 09/08/13   [provider]    Allergies Penicillins and Ace inhibitors  Family Hx Family History  Problem Relation Age of Onset  . Breast cancer Neg Hx     Social Hx Social History   Tobacco Use  . Smoking status: Current Every Day Smoker  . Smokeless tobacco: Never Used  Substance Use Topics  . Alcohol use: No  Alcohol/week: 0.0 standard drinks  . Drug use: Yes    Types: Cocaine     Review of Systems  Constitutional: Negative for fever, chills. Eyes: Negative for visual changes. ENT: Negative for sore throat. Cardiovascular: Negative for chest pain. Respiratory: + for shortness of breath. Gastrointestinal: Negative for nausea, vomiting.  Genitourinary: Negative for dysuria. Musculoskeletal: Negative for leg swelling. Skin: Negative for rash. Neurological:  Negative for for headaches.   Physical Exam  Vital Signs: ED Triage Vitals  Enc Vitals Group     BP 01/13/19 2230 (!) 137/99     Pulse Rate 01/13/19 2224 87     Resp 01/13/19 2224 14     Temp 01/13/19 2224 (!) 97.4 F (36.3 C)     Temp Source 01/13/19 2224 Oral     SpO2 01/13/19 2224 94 %     Weight 01/13/19 2217 230 lb 6.4 oz (104.5 kg)     Height 01/13/19 2217 5\' 10"  (1.778 m)     Head Circumference --      Peak Flow --      Pain Score 01/13/19 2217 8     Pain Loc --      Pain Edu? --      Excl. in [place]? --     Constitutional: Alert and oriented. Somewhat restless. Head: Normocephalic. Atraumatic. Eyes: Conjunctivae clear. Sclera anicteric. Nose: No congestion. No rhinorrhea. Mouth/Throat: Wearing mask.  Neck: No stridor.   Cardiovascular: Normal rate, regular rhythm. Extremities well perfused. Respiratory: Tachypnea, appears SOB with long sentences. Scattered expiratory wheezing.  Gastrointestinal: Soft. Non-tender. Non-distended.  Musculoskeletal: No lower extremity edema. No deformities. Neurologic:  Normal speech and language. No gross focal neurologic deficits are appreciated.  Skin: Skin is warm, dry and intact. No rash noted. Psychiatric: Mood and affect are appropriate for situation.  EKG  Personally reviewed.   Rate: 94 Rhythm: sinus Axis: LAD Intervals: wide QRS due to IVCD Non-specific IVCD Leads II, III, V2 slightly changed compared to prior No STEMI    Radiology  CXR personally reviewed: cardiomegaly with pulmonary edema   Procedures  Procedure(s) performed (including critical care):  Procedures   Initial Impression / Assessment and Plan / ED Course  64 y.o. female who presents to the ED for SOB, as above  Ddx: highest concern for HF exacerbation given her history, also consider pulmonary infection, COVID, ACS, substance use  Will obtain labs, imaging. XR with cardiomegaly and pulmonary edema. Consider HF given her hx and these XR  findings, will give dose of Lasix. If rapid COVID testing is negative, could consider BiPAP for her WOB. Anticipate admission.     Final Clinical Impression(s) / ED Diagnosis  Final diagnoses:  SOB (shortness of breath)       Note:  This document was prepared using Dragon voice recognition software and may include unintentional dictation errors.     Lilia Pro., MD 01/14/19 [phone]

## 2019-01-13 NOTE — ED Provider Notes (Signed)
-----------------------------------------   11:41 PM on 01/13/2019 -----------------------------------------  Assumed care of patient. In summary, this is a 64y/o female who presents with shortness of breath, chest x-ray indicative of pulmonary edema.  Patient is not hypoxic and not overtly tachypneic on examination.  Seems jittery and restless.  History of cocaine use.  Initial plan was to trial BiPAP but I think patient may benefit from trial of low-dose IV Ativan and diuresis first.  COVID antigen is NEGATIVE.   ----------------------------------------- 1:55 AM on 01/14/2019 -----------------------------------------  Delay due to drawing labs.  UDS positive for cocaine metabolites.  Patient has diuresed greater than 300 mL.  Currently sleeping in no acute distress.  Not hypoxic.   ----------------------------------------- 2:42 AM on 01/14/2019 -----------------------------------------  Patient has used commode several times since last check.  She is currently urinating.  She is not tachycardic, tachypneic nor hypoxic.  Patient would be appropriate to discharge home with close follow-up heart failure clinic.  I strongly cautioned her against using cocaine as it is causing deleterious effects on her heart.  Strict return precautions given.  Patient verbalizes understanding agrees with plan of care.   ----------------------------------------- 6:43 AM on 01/14/2019 -----------------------------------------  Patient was kept overnight in the ED because she drove and appeared to still be under the influence of cocaine.  She is currently sleeping in no acute distress.  Room air saturations 95%.  She may be discharged when she is awake and sober.   Paulette Blanch, MD 01/14/19 [phone]

## 2019-01-13 NOTE — ED Notes (Signed)
Pt states she was recently dx with CHF. Started on lasix. Denies missing any doses. States also has nitro at home. Denies drug use "today." smokes cigarettes. Denies weight gain.

## 2019-01-14 ENCOUNTER — Telehealth: Payer: Self-pay | Admitting: Family

## 2019-01-14 LAB — FERRITIN: Ferritin: 42 ng/mL (ref 11–307)

## 2019-01-14 LAB — URINE DRUG SCREEN, QUALITATIVE (ARMC ONLY)
Amphetamines, Ur Screen: NOT DETECTED
Barbiturates, Ur Screen: NOT DETECTED
Benzodiazepine, Ur Scrn: NOT DETECTED
Cannabinoid 50 Ng, Ur ~~LOC~~: NOT DETECTED
Cocaine Metabolite,Ur ~~LOC~~: POSITIVE — AB
MDMA (Ecstasy)Ur Screen: NOT DETECTED
Methadone Scn, Ur: NOT DETECTED
Opiate, Ur Screen: NOT DETECTED
Phencyclidine (PCP) Ur S: NOT DETECTED
Tricyclic, Ur Screen: NOT DETECTED

## 2019-01-14 LAB — C-REACTIVE PROTEIN: CRP: 1.7 mg/dL — ABNORMAL HIGH (ref <1.0)

## 2019-01-14 LAB — RESPIRATORY PANEL BY RT PCR (FLU A&B, COVID)
Influenza A by PCR: NEGATIVE
Influenza B by PCR: NEGATIVE
SARS Coronavirus 2 by RT PCR: NEGATIVE

## 2019-01-14 LAB — BRAIN NATRIURETIC PEPTIDE: B Natriuretic Peptide: 1147 pg/mL — ABNORMAL HIGH (ref 0.0–100.0)

## 2019-01-14 LAB — TROPONIN I (HIGH SENSITIVITY): Troponin I (High Sensitivity): 68 ng/L — ABNORMAL HIGH (ref <18)

## 2019-01-14 LAB — LACTATE DEHYDROGENASE: LDH: 296 U/L — ABNORMAL HIGH (ref 98–192)

## 2019-01-14 MED ORDER — LORAZEPAM 0.5 MG PO TABS
0.5000 mg | ORAL_TABLET | Freq: Once | ORAL | Status: AC
  Administered 2019-01-14: 03:00:00 0.5 mg via ORAL
  Filled 2019-01-14: qty 1

## 2019-01-14 MED ORDER — LORAZEPAM 2 MG/ML IJ SOLN
0.5000 mg | Freq: Once | INTRAMUSCULAR | Status: AC
  Administered 2019-01-14: 01:00:00 0.5 mg via INTRAVENOUS
  Filled 2019-01-14: qty 1

## 2019-01-14 NOTE — Telephone Encounter (Signed)
Called and attempted to leave message but unable to reach in order to schedule a new patient appt with her. Had a recently scheduled new patient appt after her first hospital visit related to HF and she was a no show to the appt. Received a new update she was back in ED yesterday and was asked to reschedule her an appt but unable to reach.     Alyse Low, [place]

## 2019-01-14 NOTE — ED Notes (Signed)
Pt resting quietly on stretcher in darkened exam room, eyes closed, resp even/unlab with no distress noted; pt awakens easily and denies c/o

## 2019-01-14 NOTE — Discharge Instructions (Signed)
1.  Please follow-up at the heart failure clinic in 1 to 2 days. 2.  Stop using cocaine.  This is causing your heart to fail and making you have chest pain and shortness of breath. 3.  Return to the ER for worsening symptoms, persistent vomiting, difficulty breathing or other concerns.

## 2019-01-14 NOTE — ED Notes (Signed)
PT up to restroom in room. PT states that she is still short of breath. This RN let Dr. Beather Arbour know. Dr. Beather Arbour still ok with d/c patient.

## 2019-01-14 NOTE — ED Notes (Signed)
Pt drove herself here but d/t positive cocaine in urine, pt may not drive home. Pt states she cannot find a ride. Charge RN and MD aware.

## 2019-01-14 NOTE — ED Notes (Signed)
When assessing pt prior to d/c pt still seems anxious and jittery. MD notified and dr. [name] accessed pt. Pt states the frequent movement is her normal and that she feels safe driving home. Pt being d/c at this time

## 2019-01-15 ENCOUNTER — Other Ambulatory Visit: Payer: Self-pay

## 2019-01-15 ENCOUNTER — Emergency Department: Payer: Medicaid Other

## 2019-01-15 ENCOUNTER — Encounter: Payer: Self-pay | Admitting: *Deleted

## 2019-01-15 ENCOUNTER — Emergency Department
Admission: EM | Admit: 2019-01-15 | Discharge: 2019-01-15 | Disposition: A | Discharge status: Home / Self Care | Payer: Medicaid Other | Attending: Emergency Medicine | Admitting: Emergency Medicine

## 2019-01-15 DIAGNOSIS — F1410 Cocaine abuse, uncomplicated: Secondary | ICD-10-CM | POA: Diagnosis not present

## 2019-01-15 DIAGNOSIS — R0602 Shortness of breath: Secondary | ICD-10-CM | POA: Diagnosis present

## 2019-01-15 DIAGNOSIS — F17200 Nicotine dependence, unspecified, uncomplicated: Secondary | ICD-10-CM | POA: Insufficient documentation

## 2019-01-15 DIAGNOSIS — J40 Bronchitis, not specified as acute or chronic: Secondary | ICD-10-CM | POA: Diagnosis not present

## 2019-01-15 DIAGNOSIS — Z7982 Long term (current) use of aspirin: Secondary | ICD-10-CM | POA: Diagnosis not present

## 2019-01-15 DIAGNOSIS — Z79899 Other long term (current) drug therapy: Secondary | ICD-10-CM | POA: Insufficient documentation

## 2019-01-15 DIAGNOSIS — R05 Cough: Secondary | ICD-10-CM | POA: Insufficient documentation

## 2019-01-15 LAB — BASIC METABOLIC PANEL
Anion gap: 12 (ref 5–15)
BUN: 30 mg/dL — ABNORMAL HIGH (ref 8–23)
CO2: 21 mmol/L — ABNORMAL LOW (ref 22–32)
Calcium: 9.5 mg/dL (ref 8.9–10.3)
Chloride: 109 mmol/L (ref 98–111)
Creatinine, Ser: 1.87 mg/dL — ABNORMAL HIGH (ref 0.44–1.00)
GFR calc Af Amer: 33 mL/min — ABNORMAL LOW (ref >60)
GFR calc non Af Amer: 28 mL/min — ABNORMAL LOW (ref >60)
Glucose, Bld: 128 mg/dL — ABNORMAL HIGH (ref 70–99)
Potassium: 3.5 mmol/L (ref 3.5–5.1)
Sodium: 142 mmol/L (ref 135–145)

## 2019-01-15 LAB — CBC
HCT: 40.5 % (ref 36.0–46.0)
Hemoglobin: 12.3 g/dL (ref 12.0–15.0)
MCH: 26 pg (ref 26.0–34.0)
MCHC: 30.4 g/dL (ref 30.0–36.0)
MCV: 85.6 fL (ref 80.0–100.0)
Platelets: 345 10*3/uL (ref 150–400)
RBC: 4.73 MIL/uL (ref 3.87–5.11)
RDW: 15.9 % — ABNORMAL HIGH (ref 11.5–15.5)
WBC: 4.9 10*3/uL (ref 4.0–10.5)
nRBC: 0 % (ref 0.0–0.2)

## 2019-01-15 LAB — TROPONIN I (HIGH SENSITIVITY)
Troponin I (High Sensitivity): 69 ng/L — ABNORMAL HIGH (ref <18)
Troponin I (High Sensitivity): 59 ng/L — ABNORMAL HIGH (ref <18)

## 2019-01-15 MED ORDER — ALBUTEROL SULFATE HFA 108 (90 BASE) MCG/ACT IN AERS: 2.0000 | INHALATION_SPRAY | Freq: Four times a day (QID) | RESPIRATORY_TRACT | 0 refills | Status: DC | PRN

## 2019-01-15 MED ORDER — IPRATROPIUM-ALBUTEROL 0.5-2.5 (3) MG/3ML IN SOLN
3.0000 mL | Freq: Once | RESPIRATORY_TRACT | Status: AC
  Administered 2019-01-15: 3 mL via RESPIRATORY_TRACT
  Filled 2019-01-15: qty 3

## 2019-01-15 MED ORDER — FUROSEMIDE 10 MG/ML IJ SOLN
40.0000 mg | Freq: Once | INTRAMUSCULAR | Status: AC
  Administered 2019-01-15: 40 mg via INTRAVENOUS
  Filled 2019-01-15: qty 4

## 2019-01-15 NOTE — ED Notes (Signed)
Pt uprite on stretcher in exam room with no distress noted; assisted into hosp gown & on card monitor; pt d/c yesterday for c/o [facility]; c/o persistent nonprod cough, SHOB and mid CP; pt st no cocaine use in 4 days

## 2019-01-15 NOTE — Discharge Instructions (Addendum)
Use the inhaler as needed when you feel chest tightness. Follow up with your doctor. Return to the ER for new or worsening CP or SOB, fever.

## 2019-01-15 NOTE — ED Triage Notes (Signed)
Pt brought in via ems from home with sob.  Sx began tonight.  No n/v  States pain in center of chest.  Pt alert  Speech clear.

## 2019-01-15 NOTE — ED Provider Notes (Signed)
[facility] Emergency Department Provider Note  ____________________________________________  Time seen: Approximately 6:23 AM  I have reviewed the triage vital signs and the nursing notes.   HISTORY  Chief Complaint Shortness of Breath   HPI Katelyn Lane is a 64 y.o. female with a history of CHF with a EF of <20%, cocaine abuse, smoking, hyperlipidemia, hypertension, dilated cardiomyopathy  presents for evaluation of shortness of breath.  Patient was here yesterday for the same.  Last cocaine use was 2 days ago.  Continues to smoke.  She is complaining of a cough productive of yellow phlegm and tightness in her chest.  She is complaining of shortness of breath with ambulation.  She has been taking her Lasix.  She denies weight gain or leg swelling.  She denies fever, body aches, sore throat, nausea, vomiting, diarrhea.  Patient was tested for Covid and influenza 24 hours ago and they were both negative.  Past Medical History:  Diagnosis Date  . CHF (congestive heart failure) ([place])   . Hyperlipidemia   . Renal disorder     Patient Active Problem List   Diagnosis Date Noted  . Dilated cardiomyopathy ([place]) 12/31/2018  . Chest tightness 12/31/2018  . Elevated troponin 12/31/2018  . Hypertensive urgency 12/31/2018  . Acute kidney injury superimposed on CKD ([place]) 12/31/2018  . Dyslipidemia 12/31/2018  . Depression 12/31/2018  . Acute CHF (congestive heart failure) ([place]) 12/29/2018    Past Surgical History:  Procedure Laterality Date  . RIGHT/LEFT HEART CATH AND CORONARY ANGIOGRAPHY N/A 12/30/2018   Procedure: RIGHT/LEFT HEART CATH AND CORONARY ANGIOGRAPHY;  Surgeon: Corey Skains, MD;  Location: [place] [facility] LAB;  Service: Cardiovascular;  Laterality: N/A;    Prior to Admission medications   Medication Sig Start Date End Date Taking? Authorizing Provider  albuterol (VENTOLIN HFA) 108 (90 Base) MCG/ACT inhaler Inhale 2 puffs into the lungs  every 6 (six) hours as needed for wheezing or shortness of breath. 01/15/19   Rudene Re, MD  aspirin 81 MG EC tablet Take 1 tablet (81 mg total) by mouth daily. 01/02/19   Ezekiel Slocumb, DO  atorvastatin (LIPITOR) 20 MG tablet Take 20 mg by mouth daily at 6 PM.     [provider]  carvedilol (COREG) 3.125 MG tablet Take 1 tablet (3.125 mg total) by mouth 2 (two) times daily with a meal. 01/01/19   Nicole Kindred A, DO  FLUoxetine (PROZAC) 40 MG capsule Take 40 mg by mouth daily.     [provider]  furosemide (LASIX) 40 MG tablet Take 1 tablet (40 mg total) by mouth 2 (two) times daily. 01/01/19 03/02/19  Ezekiel Slocumb, DO  isosorbide mononitrate (IMDUR) 30 MG 24 hr tablet Take 0.5 tablets (15 mg total) by mouth daily. 01/02/19   Ezekiel Slocumb, DO  loratadine (CLARITIN) 10 MG tablet Take 1 tablet (10 mg total) by mouth daily. 01/02/19   Ezekiel Slocumb, DO  nitroGLYCERIN (NITROSTAT) 0.4 MG SL tablet Place 1 tablet (0.4 mg total) under the tongue every 5 (five) minutes as needed for chest pain. 01/01/19   Ezekiel Slocumb, DO  spironolactone (ALDACTONE) 25 MG tablet Take 0.5 tablets (12.5 mg total) by mouth daily. 01/02/19   Ezekiel Slocumb, DO  traZODone (DESYREL) 100 MG tablet Take 150 mg by mouth at bedtime as needed. 09/08/13   [provider]    Allergies Penicillins and Ace inhibitors  Family History  Problem Relation Age of Onset  .  Breast cancer Neg Hx     Social History Social History   Tobacco Use  . Smoking status: Current Every Day Smoker  . Smokeless tobacco: Never Used  Substance Use Topics  . Alcohol use: No    Alcohol/week: 0.0 standard drinks  . Drug use: Yes    Types: Cocaine    Review of Systems  Constitutional: Negative for fever. Eyes: Negative for visual changes. ENT: Negative for sore throat. Neck: No neck pain  Cardiovascular: + chest tightness Respiratory: + shortness of breath,  cough Gastrointestinal: Negative for abdominal pain, vomiting or diarrhea. Genitourinary: Negative for dysuria. Musculoskeletal: Negative for back pain. Skin: Negative for rash. Neurological: Negative for headaches, weakness or numbness. Psych: No SI or HI  ____________________________________________   PHYSICAL EXAM:  VITAL SIGNS: ED Triage Vitals  Enc Vitals Group     BP 01/15/19 0114 122/90     Pulse Rate 01/15/19 0114 64     Resp 01/15/19 0114 20     Temp 01/15/19 0114 98 F (36.7 C)     Temp Source 01/15/19 0114 Oral     SpO2 01/15/19 0114 99 %     Weight 01/15/19 0117 223 lb (101.2 kg)     Height 01/15/19 0117 5\' 6"  (1.676 m)     Head Circumference --      Peak Flow --      Pain Score --      Pain Loc --      Pain Edu? --      Excl. in [place]? --     Constitutional: Alert and oriented. Well appearing and in no apparent distress. HEENT:      Head: Normocephalic and atraumatic.         Eyes: Conjunctivae are normal. Sclera is non-icteric.       Mouth/Throat: Mucous membranes are moist.       Neck: Supple with no signs of meningismus. Cardiovascular: Regular rate and rhythm. No murmurs, gallops, or rubs. 2+ symmetrical distal pulses are present in all extremities. No JVD. Respiratory: Normal respiratory effort.  She sounds very tight on exam with decreased air movement but no crackles or wheezing Gastrointestinal: Soft, non tender, and non distended with positive bowel sounds. No rebound or guarding. Musculoskeletal: Nontender with normal range of motion in all extremities. No edema, cyanosis, or erythema of extremities. Neurologic: Normal speech and language. Face is symmetric. Moving all extremities. No gross focal neurologic deficits are appreciated. Skin: Skin is warm, dry and intact. No rash noted. Psychiatric: Mood and affect are normal. Speech and behavior are normal.  ____________________________________________   LABS (all labs ordered are listed, but only  abnormal results are displayed)  Labs Reviewed  CBC - Abnormal; Notable for the following components:      Result Value   RDW 15.9 (*)    All other components within normal limits  BASIC METABOLIC PANEL - Abnormal; Notable for the following components:   CO2 21 (*)    Glucose, Bld 128 (*)    BUN 30 (*)    Creatinine, Ser 1.87 (*)    GFR calc non Af Amer 28 (*)    GFR calc Af Amer 33 (*)    All other components within normal limits  TROPONIN I (HIGH SENSITIVITY) - Abnormal; Notable for the following components:   Troponin I (High Sensitivity) 69 (*)    All other components within normal limits  TROPONIN I (HIGH SENSITIVITY) - Abnormal; Notable for the following components:   Troponin  I (High Sensitivity) 59 (*)    All other components within normal limits   ____________________________________________  EKG  ED ECG REPORT I, Rudene Re, the attending physician, personally viewed and interpreted this ECG.  Sinus rhythm with PVCs, rate of 80, prolonged QTC, left axis deviation, LVH, inferior Q waves, no ST elevation.  Unchanged from prior. ____________________________________________  RADIOLOGY  I have personally reviewed the images performed during this visit and I agree with the Radiologist's read.   Interpretation by Radiologist:  DG Chest 2 View  Result Date: 01/15/2019 CLINICAL DATA:  Shortness of breath EXAM: CHEST - 2 VIEW COMPARISON:  Radiograph 01/13/2019 FINDINGS: Mild cardiomegaly with the appearance of left atrial enlargement given a double density sign. There is improving central congestion with mild cephalization of the vascularity and diminished interstitial opacity. No pneumothorax or visible effusion. No focal consolidation. No acute osseous or soft tissue abnormality. Degenerative changes are present in the imaged spine and shoulders. IMPRESSION: Improving features of with only mild residual edema with stable cardiomegaly. Electronically Signed   By: Lovena Le M.D.   On: 01/15/2019 03:56     ____________________________________________   PROCEDURES  Procedure(s) performed: None Procedures Critical Care performed:  None ____________________________________________   INITIAL IMPRESSION / ASSESSMENT AND PLAN / ED COURSE   64 y.o. female with a history of CHF with a EF of <20%, cocaine abuse, smoking, hyperlipidemia, hypertension, dilated cardiomyopathy  presents for evaluation of shortness of breath.  Patient is well-appearing and in no distress with normal work of breathing, normal sats, on lung auscultation patient does sound very tight with decreased air movement but no crackles or wheezing.  She looks euvolemic with no JVD, no lower extremity swelling.  She continues to smoke and last cocaine use was 2 days ago.  She was seen here yesterday for the same and given IV Lasix.  She was tested for flu and Covid and they were both negative.  She has had no fever, body aches, or any other symptoms of Covid.  With a history of being a chronic smoker I wonder if patient might have may be some mild COPD that has not been diagnosed.  On exam it does sound like she would benefit from a DuoNeb.  Chest x-ray is improved when compared to one from yesterday and only shows very mild residual edema.  No infiltrates.  With no tachypnea, tachycardia, or hypoxia low suspicion for PE.  Patient is afebrile with normal white count.  No evidence of anemia.  Kidney function is at baseline.  Troponin is at baseline.  Will get a repeat troponin and reassess after DuoNeb. Will give her morning dose of lasix via IV.    _________________________ 7:22 AM on 01/15/2019 -----------------------------------------  Patient reassessed after 1 DuoNeb and feels markedly improved.  No longer having any tightness in her chest.  She reports that she was able to bring up a lot of phlegm after doing a breathing treatment and feels better.  Most likely bronchitis.  Repeat troponin remains  within patient's baseline.  At this time with no oxygen requirement, normal work of breathing, and improved symptoms patient is stable for outpatient follow-up.  Will provide her with a prescription for albuterol.  Discussed close follow-up with PCP and my standard return precautions.    As part of my medical decision making, I reviewed the following data within the [facility] notes reviewed and incorporated, Labs reviewed , EKG interpreted , Old EKG reviewed, Old chart  reviewed, Radiograph reviewed , Notes from prior ED visits and [place] Controlled Substance Database   Please note:  Patient was evaluated in Emergency Department today for the symptoms described in the history of present illness. Patient was evaluated in the context of the global COVID-19 pandemic, which necessitated consideration that the patient might be at risk for infection with the SARS-CoV-2 virus that causes COVID-19. Institutional protocols and algorithms that pertain to the evaluation of patients at risk for COVID-19 are in a state of rapid change based on information released by regulatory bodies including the CDC and federal and state organizations. These policies and algorithms were followed during the patient's care in the ED.  Some ED evaluations and interventions may be delayed as a result of limited staffing during the pandemic.   ____________________________________________   FINAL CLINICAL IMPRESSION(S) / ED DIAGNOSES   Final diagnoses:  Bronchitis      NEW MEDICATIONS STARTED DURING THIS VISIT:  ED Discharge Orders         Ordered    albuterol (VENTOLIN HFA) 108 (90 Base) MCG/ACT inhaler  Every 6 hours PRN     01/15/19 0721           Note:  This document was prepared using Dragon voice recognition software and may include unintentional dictation errors.    Alfred Levins, [place], MD 01/15/19 [phone]

## 2019-01-15 NOTE — ED Notes (Signed)
AAOx3.  Skin warm and dry.  NAD 

## 2019-01-17 ENCOUNTER — Emergency Department: Payer: Medicaid Other

## 2019-01-17 ENCOUNTER — Inpatient Hospital Stay
Admission: EM | Admit: 2019-01-17 | Discharge: 2019-01-21 | DRG: 065 | Disposition: A | Discharge status: Home / Self Care | Payer: Medicaid Other | Attending: Internal Medicine | Admitting: Internal Medicine

## 2019-01-17 ENCOUNTER — Other Ambulatory Visit: Payer: Self-pay

## 2019-01-17 DIAGNOSIS — Z20822 Contact with and (suspected) exposure to covid-19: Secondary | ICD-10-CM | POA: Diagnosis present

## 2019-01-17 DIAGNOSIS — I5020 Unspecified systolic (congestive) heart failure: Secondary | ICD-10-CM | POA: Insufficient documentation

## 2019-01-17 DIAGNOSIS — R29810 Facial weakness: Secondary | ICD-10-CM | POA: Diagnosis present

## 2019-01-17 DIAGNOSIS — F17210 Nicotine dependence, cigarettes, uncomplicated: Secondary | ICD-10-CM | POA: Diagnosis present

## 2019-01-17 DIAGNOSIS — R29700 NIHSS score 0: Secondary | ICD-10-CM | POA: Diagnosis present

## 2019-01-17 DIAGNOSIS — R402142 Coma scale, eyes open, spontaneous, at arrival to emergency department: Secondary | ICD-10-CM | POA: Diagnosis present

## 2019-01-17 DIAGNOSIS — Z66 Do not resuscitate: Secondary | ICD-10-CM | POA: Diagnosis present

## 2019-01-17 DIAGNOSIS — Z7982 Long term (current) use of aspirin: Secondary | ICD-10-CM

## 2019-01-17 DIAGNOSIS — I639 Cerebral infarction, unspecified: Secondary | ICD-10-CM

## 2019-01-17 DIAGNOSIS — E785 Hyperlipidemia, unspecified: Secondary | ICD-10-CM | POA: Diagnosis present

## 2019-01-17 DIAGNOSIS — I420 Dilated cardiomyopathy: Secondary | ICD-10-CM | POA: Diagnosis present

## 2019-01-17 DIAGNOSIS — I130 Hypertensive heart and chronic kidney disease with heart failure and stage 1 through stage 4 chronic kidney disease, or unspecified chronic kidney disease: Secondary | ICD-10-CM | POA: Diagnosis present

## 2019-01-17 DIAGNOSIS — F32A Depression, unspecified: Secondary | ICD-10-CM | POA: Diagnosis present

## 2019-01-17 DIAGNOSIS — R402362 Coma scale, best motor response, obeys commands, at arrival to emergency department: Secondary | ICD-10-CM | POA: Diagnosis present

## 2019-01-17 DIAGNOSIS — I513 Intracardiac thrombosis, not elsewhere classified: Secondary | ICD-10-CM | POA: Diagnosis present

## 2019-01-17 DIAGNOSIS — Z79899 Other long term (current) drug therapy: Secondary | ICD-10-CM

## 2019-01-17 DIAGNOSIS — R402252 Coma scale, best verbal response, oriented, at arrival to emergency department: Secondary | ICD-10-CM | POA: Diagnosis present

## 2019-01-17 DIAGNOSIS — I5022 Chronic systolic (congestive) heart failure: Secondary | ICD-10-CM | POA: Diagnosis present

## 2019-01-17 DIAGNOSIS — R471 Dysarthria and anarthria: Secondary | ICD-10-CM | POA: Diagnosis present

## 2019-01-17 DIAGNOSIS — N179 Acute kidney failure, unspecified: Secondary | ICD-10-CM | POA: Diagnosis present

## 2019-01-17 DIAGNOSIS — F329 Major depressive disorder, single episode, unspecified: Secondary | ICD-10-CM | POA: Diagnosis present

## 2019-01-17 DIAGNOSIS — Z8673 Personal history of transient ischemic attack (TIA), and cerebral infarction without residual deficits: Secondary | ICD-10-CM | POA: Diagnosis present

## 2019-01-17 DIAGNOSIS — Z880 Allergy status to penicillin: Secondary | ICD-10-CM

## 2019-01-17 DIAGNOSIS — I6340 Cerebral infarction due to embolism of unspecified cerebral artery: Principal | ICD-10-CM | POA: Diagnosis present

## 2019-01-17 DIAGNOSIS — Z888 Allergy status to other drugs, medicaments and biological substances status: Secondary | ICD-10-CM

## 2019-01-17 DIAGNOSIS — N1832 Chronic kidney disease, stage 3b: Secondary | ICD-10-CM | POA: Diagnosis present

## 2019-01-17 DIAGNOSIS — F1410 Cocaine abuse, uncomplicated: Secondary | ICD-10-CM | POA: Diagnosis present

## 2019-01-17 LAB — CBC WITH DIFFERENTIAL/PLATELET
Abs Immature Granulocytes: 0 10*3/uL (ref 0.00–0.07)
Basophils Absolute: 0 10*3/uL (ref 0.0–0.1)
Basophils Relative: 0 %
Eosinophils Absolute: 0.2 10*3/uL (ref 0.0–0.5)
Eosinophils Relative: 5 %
HCT: 42.9 % (ref 36.0–46.0)
Hemoglobin: 13.4 g/dL (ref 12.0–15.0)
Immature Granulocytes: 0 %
Lymphocytes Relative: 23 %
Lymphs Abs: 1.1 10*3/uL (ref 0.7–4.0)
MCH: 26.6 pg (ref 26.0–34.0)
MCHC: 31.2 g/dL (ref 30.0–36.0)
MCV: 85.3 fL (ref 80.0–100.0)
Monocytes Absolute: 0.5 10*3/uL (ref 0.1–1.0)
Monocytes Relative: 10 %
Neutro Abs: 3 10*3/uL (ref 1.7–7.7)
Neutrophils Relative %: 62 %
Platelets: 334 10*3/uL (ref 150–400)
RBC: 5.03 MIL/uL (ref 3.87–5.11)
RDW: 15.7 % — ABNORMAL HIGH (ref 11.5–15.5)
WBC: 4.9 10*3/uL (ref 4.0–10.5)
nRBC: 0 % (ref 0.0–0.2)

## 2019-01-17 LAB — BASIC METABOLIC PANEL
Anion gap: 13 (ref 5–15)
BUN: 49 mg/dL — ABNORMAL HIGH (ref 8–23)
CO2: 24 mmol/L (ref 22–32)
Calcium: 9.8 mg/dL (ref 8.9–10.3)
Chloride: 105 mmol/L (ref 98–111)
Creatinine, Ser: 2.41 mg/dL — ABNORMAL HIGH (ref 0.44–1.00)
GFR calc Af Amer: 24 mL/min — ABNORMAL LOW (ref >60)
GFR calc non Af Amer: 21 mL/min — ABNORMAL LOW (ref >60)
Glucose, Bld: 125 mg/dL — ABNORMAL HIGH (ref 70–99)
Potassium: 3.6 mmol/L (ref 3.5–5.1)
Sodium: 142 mmol/L (ref 135–145)

## 2019-01-17 LAB — APTT: aPTT: 28 seconds (ref 24–36)

## 2019-01-17 LAB — ETHANOL: Alcohol, Ethyl (B): <10 mg/dL (ref <10)

## 2019-01-17 LAB — PROTIME-INR
INR: 1 (ref 0.8–1.2)
Prothrombin Time: 12.6 seconds (ref 11.4–15.2)

## 2019-01-17 MED ORDER — ASPIRIN 81 MG PO CHEW
324.0000 mg | CHEWABLE_TABLET | Freq: Once | ORAL | Status: AC
  Administered 2019-01-17: 23:00:00 324 mg via ORAL

## 2019-01-17 MED ORDER — SODIUM CHLORIDE 0.9% FLUSH
3.0000 mL | Freq: Once | INTRAVENOUS | Status: DC
Start: 2019-01-17 — End: 2019-01-17

## 2019-01-17 NOTE — ED Notes (Signed)
Per tele-neurologist: no TPA, recommend admit, Dr Owens Shark notified

## 2019-01-17 NOTE — ED Notes (Signed)
Patient transported to MRI 

## 2019-01-17 NOTE — ED Provider Notes (Signed)
[facility] Emergency Department Provider Note  ____________________________________________   First MD Initiated Contact with Patient 01/17/19 2301     (approximate)  I have reviewed the triage vital signs and the nursing notes.   HISTORY  Chief Complaint Code Stroke    HPI Soleia Bezold is a 64 y.o. female with below list of previous medical conditions including CHF hyperlipidemia and renal disorder and crack cocaine use "around Christmas" presents to the emergency department secondary to acute onset of difficulty speaking at 8 PM tonight.  Patient states that she was outside "getting some fresh air and upon returning back into the home she was tried to call for her sons but "only gibberish came out".  Patient denies any weakness headache gait instability nausea or vomiting at the time.  Patient states that while here in the emergency department in the lobby she started to experience right facial weakness and numbness shortly before coming to the room.  Patient states that dysarthria resolved while in the emergency department.       Past Medical History:  Diagnosis Date  . CHF (congestive heart failure) ([place])   . Hyperlipidemia   . Renal disorder     Patient Active Problem List   Diagnosis Date Noted  . Dilated cardiomyopathy ([place]) 12/31/2018  . Chest tightness 12/31/2018  . Elevated troponin 12/31/2018  . Hypertensive urgency 12/31/2018  . Acute kidney injury superimposed on CKD ([place]) 12/31/2018  . Dyslipidemia 12/31/2018  . Depression 12/31/2018  . Acute CHF (congestive heart failure) ([place]) 12/29/2018    Past Surgical History:  Procedure Laterality Date  . RIGHT/LEFT HEART CATH AND CORONARY ANGIOGRAPHY N/A 12/30/2018   Procedure: RIGHT/LEFT HEART CATH AND CORONARY ANGIOGRAPHY;  Surgeon: Corey Skains, MD;  Location: [place] [facility];  Service: Cardiovascular;  Laterality: N/A;    Prior to Admission medications   Medication Sig  Start Date End Date Taking? Authorizing Provider  albuterol (VENTOLIN HFA) 108 (90 Base) MCG/ACT inhaler Inhale 2 puffs into the lungs every 6 (six) hours as needed for wheezing or shortness of breath. 01/15/19   Rudene Re, MD  aspirin 81 MG EC tablet Take 1 tablet (81 mg total) by mouth daily. 01/02/19   Ezekiel Slocumb, DO  atorvastatin (LIPITOR) 20 MG tablet Take 20 mg by mouth daily at 6 PM.     [provider]  carvedilol (COREG) 3.125 MG tablet Take 1 tablet (3.125 mg total) by mouth 2 (two) times daily with a meal. 01/01/19   Nicole Kindred A, DO  FLUoxetine (PROZAC) 40 MG capsule Take 40 mg by mouth daily.     [provider]  furosemide (LASIX) 40 MG tablet Take 1 tablet (40 mg total) by mouth 2 (two) times daily. 01/01/19 03/02/19  Ezekiel Slocumb, DO  isosorbide mononitrate (IMDUR) 30 MG 24 hr tablet Take 0.5 tablets (15 mg total) by mouth daily. 01/02/19   Ezekiel Slocumb, DO  loratadine (CLARITIN) 10 MG tablet Take 1 tablet (10 mg total) by mouth daily. 01/02/19   Ezekiel Slocumb, DO  nitroGLYCERIN (NITROSTAT) 0.4 MG SL tablet Place 1 tablet (0.4 mg total) under the tongue every 5 (five) minutes as needed for chest pain. 01/01/19   Ezekiel Slocumb, DO  spironolactone (ALDACTONE) 25 MG tablet Take 0.5 tablets (12.5 mg total) by mouth daily. 01/02/19   Ezekiel Slocumb, DO  traZODone (DESYREL) 100 MG tablet Take 150 mg by mouth at bedtime as needed. 09/08/13   [provider]    Allergies Penicillins and Ace inhibitors  Family History  Problem Relation Age of Onset  . Breast cancer Neg Hx     Social History Social History   Tobacco Use  . Smoking status: Current Every Day Smoker  . Smokeless tobacco: Never Used  Substance Use Topics  . Alcohol use: No    Alcohol/week: 0.0 standard drinks  . Drug use: Yes    Types: Cocaine    Review of Systems Constitutional: No fever/chills Eyes: No visual changes. ENT: No sore  throat. Cardiovascular: Denies chest pain. Respiratory: Denies shortness of breath. Gastrointestinal: No abdominal pain.  No nausea, no vomiting.  No diarrhea.  No constipation. Genitourinary: Negative for dysuria. Musculoskeletal: Negative for neck pain.  Negative for back pain. Integumentary: Negative for rash. Neurological: Negative for headaches, focal weakness or numbness. Psychiatric:  Positive for cocaine use   ____________________________________________   PHYSICAL EXAM:  VITAL SIGNS: ED Triage Vitals [01/17/19 1916]  Enc Vitals Group     BP      Pulse Rate (!) 49     Resp (!) 22     Temp      Temp src      SpO2      Weight 101 kg (222 lb 10.6 oz)     Height 1.702 m (5\' 7" )     Head Circumference      Peak Flow      Pain Score      Pain Loc      Pain Edu?      Excl. in [place]?     Constitutional: Alert and oriented.  Eyes: Conjunctivae are normal.  Head: Atraumatic Mouth/Throat: Patient is wearing a mask. Neck: No stridor.  No meningeal signs.   Cardiovascular: Normal rate, regular rhythm. Good peripheral circulation. Grossly normal heart sounds. Respiratory: Normal respiratory effort.  No retractions. Gastrointestinal: Soft and nontender. No distention.   Musculoskeletal: No lower extremity tenderness nor edema. No gross deformities of extremities. Neurologic:  Normal speech and language.  No pronator drift.  Right cranial nerve VII weakness appreciated. Skin:  Skin is warm, dry and intact. Psychiatric: Mood and affect are normal. Speech and behavior are normal.  ____________________________________________   LABS (all labs ordered are listed, but only abnormal results are displayed)  Labs Reviewed  BASIC METABOLIC PANEL - Abnormal; Notable for the following components:      Result Value   Glucose, Bld 125 (*)    BUN 49 (*)    Creatinine, Ser 2.41 (*)    GFR calc non Af Amer 21 (*)    GFR calc Af Amer 24 (*)    All other components within normal  limits  CBC WITH DIFFERENTIAL/PLATELET - Abnormal; Notable for the following components:   RDW 15.7 (*)    All other components within normal limits  URINALYSIS, COMPLETE (UACMP) WITH MICROSCOPIC  URINE DRUG SCREEN, QUALITATIVE ([facility] ONLY)   ____________________________________________  EKG  ED ECG REPORT I, [place] Samanthamarie Ezzell, the attending physician, personally viewed and interpreted this ECG.   Date: 01/18/2019  EKG Time: 11:11 PM  Rate: 72  Rhythm: Sinus rhythm with premature atrial contraction.  Axis: Normal  Intervals: Normal   ST&T Change: None _______________________________________  RADIOLOGY I, [place] Sophina Mitten, personally viewed and evaluated these images (plain radiographs) as part of my medical decision making, as well as reviewing the written report by the radiologist.  ED MD interpretation: Small cortical/subcortical acute infarction with involvement of the left primary  sensorimotor cortex no hemorrhage on MRI interpretation per radiologist.  Official radiology report(s): MR Brain Wo Contrast (neuro protocol)  Result Date: 01/17/2019 CLINICAL DATA:  Speech abnormality EXAM: MRI HEAD WITHOUT CONTRAST TECHNIQUE: Multiplanar, multiecho pulse sequences of the brain and surrounding structures were obtained without intravenous contrast. COMPARISON:  None. FINDINGS: Brain: There is cortical/subcortical reduced diffusion with involvement of the left precentral and postcentral gyri lateral to the hand motor region and therefore likely involving the face. Scattered small foci of T2 hyperintensity in the supratentorial white matter are nonspecific but may reflect mild chronic microvascular ischemic changes. Small chronic right cerebellar infarct. No evidence of intracranial hemorrhage. There is no intracranial mass, mass effect, or edema. There is no hydrocephalus or extra-axial fluid collection. Vascular: Major vessel flow voids at the skull base are preserved. Skull and upper  cervical spine: Normal marrow signal is preserved. Sinuses/Orbits: Paranasal sinuses are aerated. Orbits are unremarkable. Other: Sella is unremarkable.  Mastoid air cells are clear. IMPRESSION: Small cortical/subcortical acute infarction with involvement of left primary sensorimotor cortex. No hemorrhage. Mild chronic microvascular ischemic changes. Electronically Signed   By: Macy Mis M.D.   On: 01/17/2019 21:30   CT HEAD CODE STROKE WO CONTRAST  Result Date: 01/17/2019 CLINICAL DATA:  Code stroke. EXAM: CT HEAD WITHOUT CONTRAST TECHNIQUE: Contiguous axial images were obtained from the base of the skull through the vertex without intravenous contrast. COMPARISON:  None. FINDINGS: Brain: There is no acute intracranial hemorrhage, mass-effect, or edema. Gray-white differentiation is preserved. There is no extra-axial fluid collection. Ventricles and sulci are within normal limits in size and configuration. Vascular: No hyperdense vessel.There is atherosclerotic calcification at the skull base. Skull: Calvarium is unremarkable. Sinuses/Orbits: No acute finding. Other: None. ASPECTS [facility] Stroke Program Early CT Score) - Ganglionic level infarction (caudate, lentiform nuclei, internal capsule, insula, M1-M3 cortex): 7 - Supraganglionic infarction (M4-M6 cortex): 3 Total score (0-10 with 10 being normal): 10 IMPRESSION: 1. No acute intracranial hemorrhage or evidence of acute infarction. 2. ASPECTS is 10 These results were called by telephone at the time of interpretation on 01/17/2019 at 7:28 pm to provider [facility] , who verbally acknowledged these results. Electronically Signed   By: Macy Mis M.D.   On: 01/17/2019 19:29    ____________________________________________   PROCEDURES    .Critical Care Performed by: Gregor Hams, MD Authorized by: Gregor Hams, MD   Critical care provider statement:    Critical care time (minutes):  30   Critical care time was exclusive of:   Separately billable procedures and treating other patients (Cerebrovascular accident)   Critical care was time spent personally by me on the following activities:  Development of treatment plan with patient or surrogate, discussions with consultants, evaluation of patient's response to treatment, examination of patient, obtaining history from patient or surrogate, ordering and performing treatments and interventions, ordering and review of laboratory studies, ordering and review of radiographic studies, pulse oximetry, re-evaluation of patient's condition and review of old charts     ____________________________________________   INITIAL IMPRESSION / MDM / [place] / ED COURSE  As part of my medical decision making, I reviewed the following data within the electronic MEDICAL RECORD NUMBER   64 year old female presented with above-stated history and physical exam concerning for possible CVA.  As such stroke protocol initiated.  MRI consistent with CVA.  Patient evaluated by Dr. Shara Blazing neurologist on-call who advised against TPA as the patient is out of the window in addition patient's  NIH stroke scale is currently 1.  Patient dysarthria is resolved.  Patient given aspirin 3 and 24 mg after my evaluation.  Patient discussed with Dr. Posey Pronto for hospital admission for further evaluation and management. ____________________________________________  FINAL CLINICAL IMPRESSION(S) / ED DIAGNOSES  Final diagnoses:  Cerebrovascular accident (CVA), unspecified mechanism ([place])     MEDICATIONS GIVEN DURING THIS VISIT:  Medications  aspirin chewable tablet 324 mg (has no administration in time range)     ED Discharge Orders    None      *Please note:  Teea Embler was evaluated in Emergency Department on 01/17/2019 for the symptoms described in the history of present illness. She was evaluated in the context of the global COVID-19 pandemic, which necessitated consideration that the patient  might be at risk for infection with the SARS-CoV-2 virus that causes COVID-19. Institutional protocols and algorithms that pertain to the evaluation of patients at risk for COVID-19 are in a state of rapid change based on information released by regulatory bodies including the CDC and federal and state organizations. These policies and algorithms were followed during the patient's care in the ED.  Some ED evaluations and interventions may be delayed as a result of limited staffing during the pandemic.*  Note:  This document was prepared using Dragon voice recognition software and may include unintentional dictation errors.   Gregor Hams, MD 01/18/19 [phone]

## 2019-01-17 NOTE — ED Notes (Signed)
Patient to stat desk in no acute distress without difficulty stating that she is having numbness. Patient informed that an MRI has been ordered for her.

## 2019-01-17 NOTE — ED Notes (Signed)
Per Dr. Joan Mayans no further testing needed in triage area and MD will order MRI.

## 2019-01-17 NOTE — ED Notes (Signed)
Pt found in room att by notification from [place], RN, that this pt is a code stroke

## 2019-01-17 NOTE — ED Provider Notes (Addendum)
7:33 PM Evaluated patient for possible CODE STROKE.  Patient states she activated EMS for issues with her speech/concern for laryngitis/hoarse voice.  Symptoms reportedly started about 45 minutes prior to arrival.  Initially said that she could not speak, however on my evaluation she is alert and oriented x3, has no issues with object naming, no word finding difficulties.  Voice is slightly hoarse, but does not appear dysarthric.  Face symmetric, upper extremity and lower extremity strength intact and symmetric.  NIHSS 0.  Patient states her issues with her speech seem to be waxing and waning.  Given NIHSS 0 currently, with waxing and waning symptoms, would not pursue tPA.   CT head done via triage negative. MRI ordered for follow up/further evaluation.      Lilia Pro., MD 01/17/19 2253

## 2019-01-17 NOTE — ED Notes (Signed)
Patient with complaint of numbness now to her face. Patient still has equal grips and able to move legs without difficult. Dr. Joan Mayans notified. MRI ordered for the patient.

## 2019-01-17 NOTE — ED Triage Notes (Signed)
Pt to the er via ems. Pt suddenly has laryngitis. VSS with ems. Once pt entered triage room. Pt wanting to write answers. Told pt she could shake her head yes or no. Pt is able to write. When I attempted a temp, pt is unable to open her mouth, control her secretions, speak or smile. Facial droop present. Pt to CT with ed tech.

## 2019-01-17 NOTE — Consult Note (Addendum)
[facility] Tele[facility]rology Consult Services   Date of Service:   01/17/2019 23:21:38  Impression:     .  I63.9 - Cerebrovascular accident (CVA), unspecified mechanism ([place])  Comments/Sign-Out: Patient states that she had symptoms start around 18:30 and while there is an ED note saying an NIHSS of 0 on arrival she is not reporting resolution so I would use the last know normal of 18:30 and she is outside of the window for IV alteplase. She had an MRI brain that shows DWI lesion consistent with an acute stroke that is small in the left precentral and postcentral gyrus. Do not suspect a large vessel occlusion based on this imaging and therefore recommend aspirin and admission for further work up. Possible embolic infarct with fluctuating symptoms  Metrics: Last Known Well: 01/17/2019 18:30:00 [facility] Notification Time: 01/17/2019 23:21:38 Arrival Time: 01/17/2019 18:59:00 Stamp Time: 01/17/2019 23:21:38 Time First Login Attempt: 01/17/2019 23:28:28 Symptoms: right sided weakness and slurred speech NIHSS Start Assessment Time: 01/17/2019 23:40:53 Patient is not a candidate for Alteplase/Activase. Patient was not deemed candidate for Alteplase/Activase thrombolytics because of Last Well Known Above 4.5 Hours.  CT head showed no acute hemorrhage or acute core infarct.  Clinical Presentation is not Suggestive of Large Vessel Occlusive Disease  ED Physician notified of diagnostic impression and management plan on 01/17/2019 23:48:23  Our recommendations are outlined below.  Recommendations:     .  Activate Stroke Protocol Admission/Order Set     .  Stroke/Telemetry Floor     .  Neuro Checks     .  Bedside Swallow Eval     .  DVT Prophylaxis     .  IV Fluids, Normal Saline     .  Head of Bed 30 Degrees     .  Euglycemia and Avoid Hyperthermia (PRN Acetaminophen)     .  Initiate Aspirin 325 MG Daily  Routine Consultation with [facility] for Follow up  Care  Sign Out:     .  Discussed with Emergency Department Provider    ------------------------------------------------------------------------------  History of Present Illness: Patient is a 64 year old Female.  Patient was brought by private transportation with symptoms of right sided weakness and slurred speech  64 yo F with history of htn, CHF, and cocaine use who is presenting with right facial numbness and weakness as well as dysarthria.  She states that she last felt normal at 18:30. She has been having numbness and trouble with her speech since then.   Past Medical History:     . Hypertension  Anticoagulant use:  No  Antiplatelet use: aspirin 81mg     Examination: BP(121/105), Pulse(49), Blood Glucose(125) 1A: Level of Consciousness - Alert; keenly responsive + 0 1B: Ask Month and Age - Both Questions Right + 0 1C: Blink Eyes & Squeeze Hands - Performs Both Tasks + 0 2: Test Horizontal Extraocular Movements - Normal + 0 3: Test Visual Fields - No Visual Loss + 0 4: Test Facial Palsy (Use [place] if Obtunded) - Minor paralysis (flat nasolabial fold, smile asymmetry) + 1 5A: Test Left Arm Motor Drift - No Drift for 10 Seconds + 0 5B: Test Right Arm Motor Drift - No Drift for 10 Seconds + 0 6A: Test Left Leg Motor Drift - No Drift for 5 Seconds + 0 6B: Test Right Leg Motor Drift - No Drift for 5 Seconds + 0 7: Test Limb Ataxia (FNF/Heel-Shin) - No Ataxia + 0 8: Test Sensation - Mild-Moderate Loss: Can  Sense Being Touched + 1 9: Test Language/Aphasia - Normal; No aphasia + 0 10: Test Dysarthria - Mild-Moderate Dysarthria: Slurring but can be understood + 1 11: Test Extinction/Inattention - No abnormality + 0  NIHSS Score: 3  Pre-Morbid Modified Ranking Scale: 0 Points = No symptoms at all   Patient/Family was informed the Neurology Consult would happen via TeleHealth consult by way of interactive audio and video telecommunications and consented to receiving care  in this manner.   Due to the immediate potential for life-threatening deterioration due to underlying acute neurologic illness, I spent 35 minutes providing critical care. This time includes time for face to face visit via telemedicine, review of medical records, imaging studies and discussion of findings with providers, the patient and/or family.   Dr Carolin Sicks   [facility] [phone]   Case [id number]

## 2019-01-18 ENCOUNTER — Other Ambulatory Visit: Payer: Self-pay

## 2019-01-18 ENCOUNTER — Inpatient Hospital Stay: Payer: Medicaid Other

## 2019-01-18 DIAGNOSIS — Z880 Allergy status to penicillin: Secondary | ICD-10-CM | POA: Diagnosis not present

## 2019-01-18 DIAGNOSIS — I639 Cerebral infarction, unspecified: Secondary | ICD-10-CM | POA: Diagnosis present

## 2019-01-18 DIAGNOSIS — I5022 Chronic systolic (congestive) heart failure: Secondary | ICD-10-CM | POA: Diagnosis present

## 2019-01-18 DIAGNOSIS — R402252 Coma scale, best verbal response, oriented, at arrival to emergency department: Secondary | ICD-10-CM | POA: Diagnosis present

## 2019-01-18 DIAGNOSIS — R402142 Coma scale, eyes open, spontaneous, at arrival to emergency department: Secondary | ICD-10-CM | POA: Diagnosis present

## 2019-01-18 DIAGNOSIS — E785 Hyperlipidemia, unspecified: Secondary | ICD-10-CM | POA: Diagnosis present

## 2019-01-18 DIAGNOSIS — N179 Acute kidney failure, unspecified: Secondary | ICD-10-CM | POA: Diagnosis not present

## 2019-01-18 DIAGNOSIS — Z66 Do not resuscitate: Secondary | ICD-10-CM | POA: Diagnosis present

## 2019-01-18 DIAGNOSIS — Z79899 Other long term (current) drug therapy: Secondary | ICD-10-CM | POA: Diagnosis not present

## 2019-01-18 DIAGNOSIS — R402362 Coma scale, best motor response, obeys commands, at arrival to emergency department: Secondary | ICD-10-CM | POA: Diagnosis present

## 2019-01-18 DIAGNOSIS — F1410 Cocaine abuse, uncomplicated: Secondary | ICD-10-CM | POA: Diagnosis present

## 2019-01-18 DIAGNOSIS — Z7982 Long term (current) use of aspirin: Secondary | ICD-10-CM | POA: Diagnosis not present

## 2019-01-18 DIAGNOSIS — I6340 Cerebral infarction due to embolism of unspecified cerebral artery: Secondary | ICD-10-CM | POA: Diagnosis present

## 2019-01-18 DIAGNOSIS — I130 Hypertensive heart and chronic kidney disease with heart failure and stage 1 through stage 4 chronic kidney disease, or unspecified chronic kidney disease: Secondary | ICD-10-CM | POA: Diagnosis present

## 2019-01-18 DIAGNOSIS — I513 Intracardiac thrombosis, not elsewhere classified: Secondary | ICD-10-CM | POA: Diagnosis present

## 2019-01-18 DIAGNOSIS — F329 Major depressive disorder, single episode, unspecified: Secondary | ICD-10-CM | POA: Diagnosis present

## 2019-01-18 DIAGNOSIS — N1832 Chronic kidney disease, stage 3b: Secondary | ICD-10-CM | POA: Diagnosis present

## 2019-01-18 DIAGNOSIS — F17210 Nicotine dependence, cigarettes, uncomplicated: Secondary | ICD-10-CM | POA: Diagnosis present

## 2019-01-18 DIAGNOSIS — Z20822 Contact with and (suspected) exposure to covid-19: Secondary | ICD-10-CM | POA: Diagnosis present

## 2019-01-18 DIAGNOSIS — Z888 Allergy status to other drugs, medicaments and biological substances status: Secondary | ICD-10-CM | POA: Diagnosis not present

## 2019-01-18 DIAGNOSIS — N189 Chronic kidney disease, unspecified: Secondary | ICD-10-CM | POA: Diagnosis not present

## 2019-01-18 DIAGNOSIS — R471 Dysarthria and anarthria: Secondary | ICD-10-CM | POA: Diagnosis present

## 2019-01-18 DIAGNOSIS — R29700 NIHSS score 0: Secondary | ICD-10-CM | POA: Diagnosis present

## 2019-01-18 DIAGNOSIS — R29810 Facial weakness: Secondary | ICD-10-CM | POA: Diagnosis present

## 2019-01-18 DIAGNOSIS — I420 Dilated cardiomyopathy: Secondary | ICD-10-CM | POA: Diagnosis not present

## 2019-01-18 DIAGNOSIS — Z8673 Personal history of transient ischemic attack (TIA), and cerebral infarction without residual deficits: Secondary | ICD-10-CM | POA: Diagnosis present

## 2019-01-18 LAB — URINALYSIS, COMPLETE (UACMP) WITH MICROSCOPIC
Bacteria, UA: NONE SEEN
Bilirubin Urine: NEGATIVE
Glucose, UA: NEGATIVE mg/dL
Hgb urine dipstick: NEGATIVE
Ketones, ur: NEGATIVE mg/dL
Nitrite: NEGATIVE
Protein, ur: 30 mg/dL — AB
Specific Gravity, Urine: 1.018 (ref 1.005–1.030)
pH: 5 (ref 5.0–8.0)

## 2019-01-18 LAB — URINE DRUG SCREEN, QUALITATIVE (ARMC ONLY)
Amphetamines, Ur Screen: NOT DETECTED
Barbiturates, Ur Screen: NOT DETECTED
Benzodiazepine, Ur Scrn: NOT DETECTED
Cannabinoid 50 Ng, Ur ~~LOC~~: NOT DETECTED
Cocaine Metabolite,Ur ~~LOC~~: POSITIVE — AB
MDMA (Ecstasy)Ur Screen: NOT DETECTED
Methadone Scn, Ur: NOT DETECTED
Opiate, Ur Screen: NOT DETECTED
Phencyclidine (PCP) Ur S: NOT DETECTED
Tricyclic, Ur Screen: NOT DETECTED

## 2019-01-18 LAB — LIPID PANEL
Cholesterol: 228 mg/dL — ABNORMAL HIGH (ref 0–200)
HDL: 46 mg/dL (ref >40)
LDL Cholesterol: 134 mg/dL — ABNORMAL HIGH (ref 0–99)
Total CHOL/HDL Ratio: 5 RATIO
Triglycerides: 242 mg/dL — ABNORMAL HIGH (ref <150)
VLDL: 48 mg/dL — ABNORMAL HIGH (ref 0–40)

## 2019-01-18 LAB — HEMOGLOBIN A1C
Hgb A1c MFr Bld: 5.9 % — ABNORMAL HIGH (ref 4.8–5.6)
Mean Plasma Glucose: 122.63 mg/dL

## 2019-01-18 LAB — HIV ANTIBODY (ROUTINE TESTING W REFLEX): HIV Screen 4th Generation wRfx: NONREACTIVE

## 2019-01-18 LAB — SARS CORONAVIRUS 2 (TAT 6-24 HRS): SARS Coronavirus 2: NEGATIVE

## 2019-01-18 LAB — TROPONIN I (HIGH SENSITIVITY): Troponin I (High Sensitivity): 37 ng/L — ABNORMAL HIGH (ref <18)

## 2019-01-18 MED ORDER — ASPIRIN 300 MG RE SUPP
300.0000 mg | Freq: Every day | RECTAL | Status: DC
  Filled 2019-01-18 (×2): qty 1

## 2019-01-18 MED ORDER — STROKE: EARLY STAGES OF RECOVERY BOOK: Freq: Once | Status: AC

## 2019-01-18 MED ORDER — ACETAMINOPHEN 325 MG PO TABS
650.0000 mg | ORAL_TABLET | ORAL | Status: DC | PRN
  Administered 2019-01-19 (×2): 650 mg via ORAL
  Filled 2019-01-18 (×2): qty 2

## 2019-01-18 MED ORDER — ASPIRIN EC 325 MG PO TBEC
325.0000 mg | DELAYED_RELEASE_TABLET | Freq: Every day | ORAL | Status: DC
  Administered 2019-01-18 – 2019-01-19 (×2): 325 mg via ORAL
  Filled 2019-01-18 (×2): qty 1

## 2019-01-18 MED ORDER — HEPARIN SODIUM (PORCINE) 5000 UNIT/ML IJ SOLN
5000.0000 [IU] | Freq: Three times a day (TID) | INTRAMUSCULAR | Status: DC
  Administered 2019-01-18 – 2019-01-19 (×5): 5000 [IU] via SUBCUTANEOUS
  Filled 2019-01-18 (×5): qty 1

## 2019-01-18 MED ORDER — FLUOXETINE HCL 20 MG PO CAPS
40.0000 mg | ORAL_CAPSULE | Freq: Every day | ORAL | Status: DC
  Administered 2019-01-18 – 2019-01-21 (×4): 40 mg via ORAL
  Filled 2019-01-18 (×5): qty 2

## 2019-01-18 MED ORDER — ACETAMINOPHEN 650 MG RE SUPP: 650.0000 mg | RECTAL | Status: DC | PRN

## 2019-01-18 MED ORDER — NITROGLYCERIN 0.4 MG SL SUBL
0.4000 mg | SUBLINGUAL_TABLET | SUBLINGUAL | Status: DC | PRN
  Administered 2019-01-18 (×2): 0.4 mg via SUBLINGUAL
  Filled 2019-01-18: qty 1

## 2019-01-18 MED ORDER — ATORVASTATIN CALCIUM 20 MG PO TABS
40.0000 mg | ORAL_TABLET | Freq: Every day | ORAL | Status: DC
  Administered 2019-01-18 – 2019-01-20 (×3): 40 mg via ORAL
  Filled 2019-01-18 (×3): qty 2

## 2019-01-18 MED ORDER — SENNOSIDES-DOCUSATE SODIUM 8.6-50 MG PO TABS: 1.0000 | ORAL_TABLET | Freq: Every evening | ORAL | Status: DC | PRN

## 2019-01-18 MED ORDER — LORAZEPAM 0.5 MG PO TABS
0.5000 mg | ORAL_TABLET | Freq: Once | ORAL | Status: AC
  Administered 2019-01-18: 21:00:00 0.5 mg via ORAL
  Filled 2019-01-18: qty 1

## 2019-01-18 MED ORDER — ACETAMINOPHEN 160 MG/5ML PO SOLN
650.0000 mg | ORAL | Status: DC | PRN
  Filled 2019-01-18: qty 20.3

## 2019-01-18 NOTE — ED Notes (Signed)
Pt given pillow.

## 2019-01-18 NOTE — H&P (Signed)
History and Physical    Katelyn Lane [id number] DOB: 02/18/1955 DOA: 01/17/2019  PCP: [name], MD  Patient coming from: Home  I have personally briefly reviewed patient's old medical records in [place]  Chief Complaint: Right face numbness and dysarthria  HPI: Katelyn Lane is a 64 y.o. female with medical history significant for HFrEF/dilated cardiomyopathy with EF 15%, hypertension, hyperlipidemia, CKD stage III, depression, and substance use (tobacco/cocaine) who presents to the ED for evaluation of right face numbness and dysarthria.  Patient states she was in her usual state of health until around 1830 on 01/17/2019.  She was sitting outside and when she walked to go inside to call out to her son she noticed she was only speaking unintelligible speech.  She noticed having numbness on the right side of her face as well.  She did not have any other focal weakness, lightheadedness/dizziness, chest pain, palpitations, dyspnea, nausea, vomiting, change in vision.  She called EMS and was brought to the ED for further evaluation.  She says her speech is now much improved.  While in the ED, she did develop new right facial droop.  She was recently admitted from 12/29/2018-01/01/2019 for acute CHF found to have dilated cardiomyopathy with EF of 15%.  She is not sure that she has obtained all her medications since discharge or if she has been taking them correctly.  She has been taking a low-dose aspirin 81 mg daily.  She reports to prior cocaine use with last use during the holidays.  She denies any more recent use.  She reports smoking up to 6 cigarettes/day.  ED Course:  Initial vitals showed BP 121/105, pulse 74, RR 19, SPO2 100% on room air.  Labs are notable for BUN 49, creatinine 2.41 (baseline 1.8-2.0), sodium 142, potassium 3.6, bicarb 24, serum glucose 125, WBC 4.9, hemoglobin 13.4, platelets 334,000.  Serum ethanol level is undetectable.  Urinalysis and UDS  are ordered and pending.  CT head code stroke study was negative for acute intracranial hemorrhage or infarct.  MRI brain without contrast was obtained and showed a small cortical/subcortical acute infarct with involvement of the left precentral and precentral gyri.  Patient was given aspirin 324 mg once.  Teleneurology were consulted and recommended admission for further work-up.  She was considered not to be a TPA candidate due to last known well more than 4.5 hours.  The hospitalist service was consulted to admit for further evaluation and management.  Review of Systems: All systems reviewed and are negative except as documented in history of present illness above.   Past Medical History:  Diagnosis Date  . CHF (congestive heart failure) ([place])   . Hyperlipidemia   . Renal disorder     Past Surgical History:  Procedure Laterality Date  . RIGHT/LEFT HEART CATH AND CORONARY ANGIOGRAPHY N/A 12/30/2018   Procedure: RIGHT/LEFT HEART CATH AND CORONARY ANGIOGRAPHY;  Surgeon: Corey Skains, MD;  Location: [facility];  Service: Cardiovascular;  Laterality: N/A;    Social History:  reports that she has been smoking. She has never used smokeless tobacco. She reports current drug use. Drug: Cocaine. She reports that she does not drink alcohol.  Allergies  Allergen Reactions  . Penicillins Hives, Rash and Other (See Comments)    Did it involve swelling of the face/tongue/throat, SOB, or low BP? No Did it involve sudden or severe rash/hives, skin peeling, or any reaction on the inside of your mouth or nose? Yes Did you need  to seek medical attention at a hospital or doctor's office? Yes When did it last happen? >25 years If all above answers are "NO", may proceed with cephalosporin use.   . Ace Inhibitors Nausea And Vomiting    Family History  Problem Relation Age of Onset  . Breast cancer Neg Hx      Prior to Admission medications   Medication Sig Start Date End  Date Taking? Authorizing Provider  albuterol (VENTOLIN HFA) 108 (90 Base) MCG/ACT inhaler Inhale 2 puffs into the lungs every 6 (six) hours as needed for wheezing or shortness of breath. 01/15/19   Rudene Re, MD  aspirin 81 MG EC tablet Take 1 tablet (81 mg total) by mouth daily. 01/02/19   Ezekiel Slocumb, DO  atorvastatin (LIPITOR) 20 MG tablet Take 20 mg by mouth daily at 6 PM.     [provider]  carvedilol (COREG) 3.125 MG tablet Take 1 tablet (3.125 mg total) by mouth 2 (two) times daily with a meal. 01/01/19   Nicole Kindred A, DO  FLUoxetine (PROZAC) 40 MG capsule Take 40 mg by mouth daily.     [provider]  furosemide (LASIX) 40 MG tablet Take 1 tablet (40 mg total) by mouth 2 (two) times daily. 01/01/19 03/02/19  Ezekiel Slocumb, DO  isosorbide mononitrate (IMDUR) 30 MG 24 hr tablet Take 0.5 tablets (15 mg total) by mouth daily. 01/02/19   Ezekiel Slocumb, DO  loratadine (CLARITIN) 10 MG tablet Take 1 tablet (10 mg total) by mouth daily. 01/02/19   Ezekiel Slocumb, DO  nitroGLYCERIN (NITROSTAT) 0.4 MG SL tablet Place 1 tablet (0.4 mg total) under the tongue every 5 (five) minutes as needed for chest pain. 01/01/19   Ezekiel Slocumb, DO  spironolactone (ALDACTONE) 25 MG tablet Take 0.5 tablets (12.5 mg total) by mouth daily. 01/02/19   Ezekiel Slocumb, DO  traZODone (DESYREL) 100 MG tablet Take 150 mg by mouth at bedtime as needed. 09/08/13   [provider]    Physical Exam: Vitals:   01/17/19 1916 01/17/19 2311  BP:  (!) 121/105  Pulse: (!) 49 (!) 48  Resp: (!) 22 19  SpO2:  100%  Weight: 101 kg   Height: 5\' 7"  (1.702 m)     Constitutional: Resting supine in bed, NAD, calm, comfortable Eyes: PERRL, EOMI, lids and conjunctivae normal ENMT: Mucous membranes are moist. Posterior pharynx clear of any exudate or lesions.Normal dentition.  Neck: normal, supple, no masses. Respiratory: clear to auscultation bilaterally, no wheezing, no  crackles. Normal respiratory effort. No accessory muscle use.  Cardiovascular: Regular rate and rhythm, no murmurs / rubs / gallops. No extremity edema. 2+ pedal pulses. Abdomen: no tenderness, no masses palpated. No hepatosplenomegaly. Bowel sounds positive.  Musculoskeletal: no clubbing / cyanosis. No joint deformity upper and lower extremities. Good ROM, no contractures. Normal muscle tone.  Skin: no rashes, lesions, ulcers. No induration Neurologic: Speech fluent with ?  Mild dysarthria, right facial droop and right facial numbness present. Sensation otherwise intact, DTR normal. Strength 5/5 in all 4.  Psychiatric: Normal judgment and insight. Alert and oriented x 3. Normal mood.   Labs on Admission: I have personally reviewed following labs and imaging studies  CBC: Recent Labs  Lab 01/13/19 2223 01/15/19 0120 01/17/19 2006  WBC 5.6 4.9 4.9  NEUTROABS  --   --  3.0  HGB 12.2 12.3 13.4  HCT 40.3 40.5 42.9  MCV 86.7 85.6 85.3  PLT 371 345  [id number]   Basic Metabolic Panel: Recent Labs  Lab 01/13/19 2223 01/15/19 0233 01/17/19 2006  NA 141 142 142  K 3.9 3.5 3.6  CL 111 109 105  CO2 22 21* 24  GLUCOSE 151* 128* 125*  BUN 37* 30* 49*  CREATININE 2.10* 1.87* 2.41*  CALCIUM 8.8* 9.5 9.8   GFR: Estimated Creatinine Clearance: 29.2 mL/min (A) (by C-G formula based on SCr of 2.41 mg/dL (H)). Liver Function Tests: No results for input(s): AST, ALT, ALKPHOS, BILITOT, PROT, ALBUMIN in the last 168 hours. No results for input(s): LIPASE, AMYLASE in the last 168 hours. No results for input(s): AMMONIA in the last 168 hours. Coagulation Profile: Recent Labs  Lab 01/17/19 2325  INR 1.0   Cardiac Enzymes: No results for input(s): CKTOTAL, CKMB, CKMBINDEX, TROPONINI in the last 168 hours. BNP (last 3 results) No results for input(s): PROBNP in the last 8760 hours. HbA1C: No results for input(s): HGBA1C in the last 72 hours. CBG: No results for input(s): GLUCAP in the last 168  hours. Lipid Profile: No results for input(s): CHOL, HDL, LDLCALC, TRIG, CHOLHDL, LDLDIRECT in the last 72 hours. Thyroid Function Tests: No results for input(s): TSH, T4TOTAL, FREET4, T3FREE, THYROIDAB in the last 72 hours. Anemia Panel: No results for input(s): VITAMINB12, FOLATE, FERRITIN, TIBC, IRON, RETICCTPCT in the last 72 hours. Urine analysis: No results found for: COLORURINE, APPEARANCEUR, LABSPEC, PHURINE, GLUCOSEU, HGBUR, BILIRUBINUR, KETONESUR, PROTEINUR, UROBILINOGEN, NITRITE, LEUKOCYTESUR  Radiological Exams on Admission: MR Brain Wo Contrast (neuro protocol)  Result Date: 01/17/2019 CLINICAL DATA:  Speech abnormality EXAM: MRI HEAD WITHOUT CONTRAST TECHNIQUE: Multiplanar, multiecho pulse sequences of the brain and surrounding structures were obtained without intravenous contrast. COMPARISON:  None. FINDINGS: Brain: There is cortical/subcortical reduced diffusion with involvement of the left precentral and postcentral gyri lateral to the hand motor region and therefore likely involving the face. Scattered small foci of T2 hyperintensity in the supratentorial white matter are nonspecific but may reflect mild chronic microvascular ischemic changes. Small chronic right cerebellar infarct. No evidence of intracranial hemorrhage. There is no intracranial mass, mass effect, or edema. There is no hydrocephalus or extra-axial fluid collection. Vascular: Major vessel flow voids at the skull base are preserved. Skull and upper cervical spine: Normal marrow signal is preserved. Sinuses/Orbits: Paranasal sinuses are aerated. Orbits are unremarkable. Other: Sella is unremarkable.  Mastoid air cells are clear. IMPRESSION: Small cortical/subcortical acute infarction with involvement of left primary sensorimotor cortex. No hemorrhage. Mild chronic microvascular ischemic changes. Electronically Signed   By: Macy Mis M.D.   On: 01/17/2019 21:30   CT HEAD CODE STROKE WO CONTRAST  Result Date:  01/17/2019 CLINICAL DATA:  Code stroke. EXAM: CT HEAD WITHOUT CONTRAST TECHNIQUE: Contiguous axial images were obtained from the base of the skull through the vertex without intravenous contrast. COMPARISON:  None. FINDINGS: Brain: There is no acute intracranial hemorrhage, mass-effect, or edema. Gray-white differentiation is preserved. There is no extra-axial fluid collection. Ventricles and sulci are within normal limits in size and configuration. Vascular: No hyperdense vessel.There is atherosclerotic calcification at the skull base. Skull: Calvarium is unremarkable. Sinuses/Orbits: No acute finding. Other: None. ASPECTS [facility] Stroke Program Early CT Score) - Ganglionic level infarction (caudate, lentiform nuclei, internal capsule, insula, M1-M3 cortex): 7 - Supraganglionic infarction (M4-M6 cortex): 3 Total score (0-10 with 10 being normal): 10 IMPRESSION: 1. No acute intracranial hemorrhage or evidence of acute infarction. 2. ASPECTS is 10 These results were called by telephone at the time of interpretation on 01/17/2019 at  7:28 pm to provider Blake Divine , who verbally acknowledged these results. Electronically Signed   By: Macy Mis M.D.   On: 01/17/2019 19:29    EKG: Independently reviewed. Sinus rhythm with PAC, IVCD, LVH, QTC 503.  Similar to prior.  Assessment/Plan Principal Problem:   Stroke [facility]) Active Problems:   HFrEF (heart failure with reduced ejection fraction) (HCC)   Dilated cardiomyopathy (H[facility])   Acute kidney injury superimposed on CKD ([place])   Dyslipidemia   Depression  Amandalee Winterhalter is a 64 y.o. female with medical history significant for HFrEF/dilated cardiomyopathy with EF 15%, hypertension, hyperlipidemia, CKD stage III, depression, and substance use (tobacco/cocaine) who is admitted with acute stroke.  Acute stroke: With initial dysarthria, right facial numbness, now with right facial droop.  Speech is improving.  MRI brain shows small cortical/subcortical acute  infarct with involvement of left primary sensorimotor cortex.  Telemetry neurology consulted and recommended admission for further work-up.  Question of embolic infarct. -Monitor on telemetry -Continue aspirin 325 mg daily -Increase home atorvastatin to 40 mg daily -Obtain carotid Dopplers -Will repeat echocardiogram to assess for possible embolic source, may need TEE -Check A1c, lipid panel -PT/OT/SLP eval -Continue neurochecks -Allow for permissive hypertension up to BP 220/120  AKI on CKD stage III: Holding home spironolactone and Lasix.  Recheck labs in a.m.  HFrEF/dilated cardiomyopathy: Echocardiogram 12/29/2018 showed EF [id number], grade 3 diastolic dysfunction, global LV hypokinesis.  Right/left heart cath 12/31/2018 showed severe dilated cardiomyopathy with EF 15% and minimal atherosclerosis of left circumflex and right coronary.  Appears euvolemic and well compensated. -Holding home Coreg, spironolactone, and Lasix to allow for permissive hypertension in setting of acute CVA -Monitor strict I/O's and daily weights  Hypertension: Holding Coreg, spironolactone, Lasix as above.  Hyperlipidemia: Increase home atorvastatin 40 mg daily.  Depression: Continue fluoxetine.  Tobacco use: Patient counseled on smoking cessation.  Substance use: Patient counseled on cessation from cocaine use.  DVT prophylaxis: Subcutaneous heparin Code Status: DNR, confirmed with patient Family Communication: Discussed with patient, she has discussed with family Disposition Plan: Likely discharge to home pending further stroke work-up and PT/OT/SLP eval Consults called: Teleneurology Admission status: Inpatient for management of acute stroke.   Zada Finders MD [facility]  If 7PM-7AM, please contact night-coverage [email or web address]  01/18/2019, 12:44 AM

## 2019-01-18 NOTE — Plan of Care (Signed)
  Problem: Education: Goal: Knowledge of General Education information will improve Description: Including pain rating scale, medication(s)/side effects and non-pharmacologic comfort measures Outcome: Progressing   Problem: Health Behavior/Discharge Planning: Goal: Ability to manage health-related needs will improve Outcome: Progressing   Problem: Clinical Measurements: Goal: Ability to maintain clinical measurements within normal limits will improve Outcome: Progressing Goal: Will remain free from infection Outcome: Progressing Goal: Diagnostic test results will improve Outcome: Progressing Goal: Respiratory complications will improve Outcome: Progressing Goal: Cardiovascular complication will be avoided Outcome: Progressing   Problem: Activity: Goal: Risk for activity intolerance will decrease Outcome: Progressing   Problem: Nutrition: Goal: Adequate nutrition will be maintained Outcome: Progressing   Problem: Coping: Goal: Level of anxiety will decrease Outcome: Progressing   Problem: Elimination: Goal: Will not experience complications related to bowel motility Outcome: Progressing Goal: Will not experience complications related to urinary retention Outcome: Progressing   Problem: Pain Managment: Goal: General experience of comfort will improve Outcome: Progressing   Problem: Safety: Goal: Ability to remain free from injury will improve Outcome: Progressing   Problem: Skin Integrity: Goal: Risk for impaired skin integrity will decrease Outcome: Progressing   Problem: Education: Goal: Knowledge of disease or condition will improve Outcome: Progressing Goal: Knowledge of secondary prevention will improve Outcome: Progressing Goal: Knowledge of patient specific risk factors addressed and post discharge goals established will improve Outcome: Progressing   Problem: Coping: Goal: Will verbalize positive feelings about self Outcome: Progressing Goal: Will  identify appropriate support needs Outcome: Progressing   Problem: Health Behavior/Discharge Planning: Goal: Ability to manage health-related needs will improve Outcome: Progressing   Problem: Nutrition: Goal: Risk of aspiration will decrease Outcome: Progressing

## 2019-01-18 NOTE — ED Notes (Signed)
Call back from [place], [place], ready for report - report already given to [place], Therapist, sports, charge for pt - calling for transport

## 2019-01-18 NOTE — Progress Notes (Signed)
Pt c/o mid chest tightness 6-7 out of 10 today.  VSS. Pt currently eating lunch sitting on the recliner.  Per night RN, pt c/o chest pain this am and MD ordered troponin and EKG.  Notified Dr Reesa Chew earlier of EKG results and notified of the above. Pt declined med at this time.  MD will place orders for nitroglycerin if needed for chest pain.

## 2019-01-18 NOTE — Progress Notes (Signed)
Notified Dr Reesa Chew of EKG results via secured text message.  Also notified MD that pt passed swallow screen last night, but still NPO.  MD to place diet order.

## 2019-01-18 NOTE — Progress Notes (Signed)
2x nitro given for c/o mid chest tightness 7 out of 10. Currently chest tightness down to 6.  VSS. Pt declined additional nitro. Pt is watching TV.  Dr Reesa Chew notified of the above.  No new orders.

## 2019-01-18 NOTE — Evaluation (Signed)
Physical Therapy Evaluation Patient Details Name: Katelyn Lane MRN: [id number] DOB: 1955/05/02 Today's Date: 01/18/2019   History of Present Illness  Patient is a 64 year old female admitted with reports of dysarthira and facial numbness. MRI shows L small cortical/subcortical acute infarction. PMH includes: HTN, HLD, CKD, substance abuse.  Clinical Impression  Patient received in bathroom independently upon entering room. She ambulated out and around room independently without deficit. Strength in UE & LEs is symetrical and 5/5.  She continues to report numbness around cheek on right side of face, and that her speech is not quite normal. Patient does not demonstrate the need for PT intervention at this time. Will sign off.       Follow Up Recommendations No PT follow up    Equipment Recommendations  None recommended by PT    Recommendations for Other Services       Precautions / Restrictions Precautions Precautions: Fall Restrictions Weight Bearing Restrictions: No      Mobility  Bed Mobility Overal bed mobility: Independent             General bed mobility comments: patient received in bathroom independently  Transfers Overall transfer level: Independent Equipment used: None Transfers: Sit to/from Stand Sit to Stand: Independent         General transfer comment: ambulated out of bathroom independently  Ambulation/Gait Ambulation/Gait assistance: Independent   Assistive device: None Gait Pattern/deviations: WFL(Within Functional Limits)     General Gait Details: ambulating in room indepednently. No LOB or deficits noted.  Stairs            Wheelchair Mobility    Modified Rankin (Stroke Patients Only)       Balance Overall balance assessment: Independent                                           Pertinent Vitals/Pain Pain Assessment: No/denies pain    Home Living Family/patient expects to be discharged to:: Private  residence Living Arrangements: Non-relatives/Friends Available Help at Discharge: Friend(s)   Home Access: Stairs to enter Entrance Stairs-Rails: Can reach Optician, dispensing of Steps: 2 Home Layout: One level Home Equipment: Grab bars - tub/shower;Grab bars - toilet      Prior Function Level of Independence: Independent         Comments: pt reports independent with all ADLs, still driving including driving herself here to ED, denies any recent falls     Hand Dominance        Extremity/Trunk Assessment   Upper Extremity Assessment Upper Extremity Assessment: Overall WFL for tasks assessed    Lower Extremity Assessment Lower Extremity Assessment: Overall WFL for tasks assessed    Cervical / Trunk Assessment Cervical / Trunk Assessment: Normal  Communication   Communication: Expressive difficulties(reports her speech is not back to normal)  Cognition Arousal/Alertness: Awake/alert Behavior During Therapy: WFL for tasks assessed/performed Overall Cognitive Status: Within Functional Limits for tasks assessed                                        General Comments      Exercises     Assessment/Plan    PT Assessment Patent does not need any further PT services;All further PT needs can be met in the next venue of care  PT Problem List         PT Treatment Interventions      PT Goals (Current goals can be found in the Care Plan section)  Acute Rehab PT Goals Patient Stated Goal: go home PT Goal Formulation: With patient Time For Goal Achievement: 01/22/19 Potential to Achieve Goals: Good    Frequency     Barriers to discharge        Co-evaluation               AM-PAC PT "6 Clicks" Mobility  Outcome Measure Help needed turning from your back to your side while in a flat bed without using bedrails?: None Help needed moving from lying on your back to sitting on the side of a flat bed without using bedrails?:  None Help needed moving to and from a bed to a chair (including a wheelchair)?: None Help needed standing up from a chair using your arms (e.g., wheelchair or bedside chair)?: None Help needed to walk in hospital room?: None Help needed climbing 3-5 steps with a railing? : None 6 Click Score: 24    End of Session   Activity Tolerance: Patient tolerated treatment well Patient left: in chair;with call bell/phone within reach Nurse Communication: Mobility status PT Visit Diagnosis: Other abnormalities of gait and mobility (R26.89)    Time: 1335-1400 PT Time Calculation (min) (ACUTE ONLY): 25 min   Charges:   PT Evaluation $PT Eval Moderate Complexity: 1 Mod PT Treatments $Gait Training: 8-22 mins        Jordan Pardini, PT, GCS 01/18/19,2:38 PM

## 2019-01-18 NOTE — ED Notes (Signed)
Pt given snacks and sprite

## 2019-01-18 NOTE — Progress Notes (Signed)
No charge progress note.  Katelyn Lane is a 64 y.o. female with medical history significant for HFrEF/dilated cardiomyopathy with EF 15%, hypertension, hyperlipidemia, CKD stage III, depression, and substance use (tobacco/cocaine) who presents to the ED for evaluation of right face numbness and dysarthria. According to patient last cocaine use was during the holidays. Initial CT head was negative but MRI shows small cortical/subcortical acute infarct with involvement of left precentral gyri. Telemetry neurology was consulted and she was admitted for further work-up. No TPA as she was out of window.  Patient was feeling better when seen this morning.  She continued to have mild right-sided facial droop, speech has been improved. She was able to swallow without any concerns. No new focal neurologic deficit. -Continue current management and work-up.

## 2019-01-19 ENCOUNTER — Inpatient Hospital Stay
Admit: 2019-01-19 | Discharge: 2019-01-19 | Disposition: A | Discharge status: Home / Self Care | Payer: Medicaid Other | Attending: Internal Medicine | Admitting: Internal Medicine

## 2019-01-19 DIAGNOSIS — I420 Dilated cardiomyopathy: Secondary | ICD-10-CM

## 2019-01-19 DIAGNOSIS — I639 Cerebral infarction, unspecified: Secondary | ICD-10-CM

## 2019-01-19 DIAGNOSIS — N179 Acute kidney failure, unspecified: Secondary | ICD-10-CM

## 2019-01-19 DIAGNOSIS — N189 Chronic kidney disease, unspecified: Secondary | ICD-10-CM

## 2019-01-19 LAB — CBC
HCT: 44.1 % (ref 36.0–46.0)
Hemoglobin: 13.3 g/dL (ref 12.0–15.0)
MCH: 26.1 pg (ref 26.0–34.0)
MCHC: 30.2 g/dL (ref 30.0–36.0)
MCV: 86.5 fL (ref 80.0–100.0)
Platelets: 293 10*3/uL (ref 150–400)
RBC: 5.1 MIL/uL (ref 3.87–5.11)
RDW: 15.4 % (ref 11.5–15.5)
WBC: 4.6 10*3/uL (ref 4.0–10.5)
nRBC: 0 % (ref 0.0–0.2)

## 2019-01-19 LAB — ECHOCARDIOGRAM COMPLETE
Height: 66 in
Weight: 3536 oz

## 2019-01-19 MED ORDER — WARFARIN SODIUM 7.5 MG PO TABS
7.5000 mg | ORAL_TABLET | Freq: Once | ORAL | Status: AC
  Administered 2019-01-19: 7.5 mg via ORAL
  Filled 2019-01-19: qty 1

## 2019-01-19 MED ORDER — MORPHINE SULFATE (PF) 2 MG/ML IV SOLN
0.5000 mg | Freq: Once | INTRAVENOUS | Status: AC
  Administered 2019-01-19: 06:00:00 0.5 mg via INTRAVENOUS
  Filled 2019-01-19: qty 1

## 2019-01-19 MED ORDER — HEPARIN (PORCINE) 25000 UT/250ML-% IV SOLN
950.0000 [IU]/h | INTRAVENOUS | Status: DC
  Administered 2019-01-19: 18:00:00 1000 [IU]/h via INTRAVENOUS
  Filled 2019-01-19: qty 250

## 2019-01-19 MED ORDER — WARFARIN - PHARMACIST DOSING INPATIENT: Freq: Every day | Status: DC

## 2019-01-19 MED ORDER — ASPIRIN EC 81 MG PO TBEC: 81.0000 mg | DELAYED_RELEASE_TABLET | Freq: Every day | ORAL | Status: DC

## 2019-01-19 MED ORDER — PERFLUTREN LIPID MICROSPHERE
1.0000 mL | INTRAVENOUS | Status: AC | PRN
  Administered 2019-01-19: 2 mL via INTRAVENOUS
  Filled 2019-01-19: qty 10

## 2019-01-19 NOTE — Progress Notes (Signed)
[facility] [place] NAME: Katelyn Lane    MR#:  [id number]  DATE OF BIRTH:  1955-05-26  SUBJECTIVE:  CHIEF COMPLAINT:   Chief Complaint  Patient presents with  . Code Stroke  anxious, wants to go home, no complaints REVIEW OF SYSTEMS:  Review of Systems  Constitutional: Negative for diaphoresis, fever, malaise/fatigue and weight loss.  HENT: Negative for ear discharge, ear pain, hearing loss, nosebleeds, sore throat and tinnitus.   Eyes: Negative for blurred vision and pain.  Respiratory: Negative for cough, hemoptysis, shortness of breath and wheezing.   Cardiovascular: Negative for chest pain, palpitations, orthopnea and leg swelling.  Gastrointestinal: Negative for abdominal pain, blood in stool, constipation, diarrhea, heartburn, nausea and vomiting.  Genitourinary: Negative for dysuria, frequency and urgency.  Musculoskeletal: Negative for back pain and myalgias.  Skin: Negative for itching and rash.  Neurological: Negative for dizziness, tingling, tremors, focal weakness, seizures, weakness and headaches.  Psychiatric/Behavioral: Negative for depression. The patient is not nervous/anxious.     DRUG ALLERGIES:   Allergies  Allergen Reactions  . Penicillins Hives, Rash and Other (See Comments)    Did it involve swelling of the face/tongue/throat, SOB, or low BP? No Did it involve sudden or severe rash/hives, skin peeling, or any reaction on the inside of your mouth or nose? Yes Did you need to seek medical attention at a hospital or doctor's office? Yes When did it last happen? >25 years If all above answers are "NO", may proceed with cephalosporin use.   . Ace Inhibitors Nausea And Vomiting   VITALS:  Blood pressure (!) 126/99, pulse 75, temperature 97.7 F (36.5 C), temperature source Oral, resp. rate 18, height 5\' 6"  (1.676 m), weight 100.2 kg, SpO2 98 %. PHYSICAL EXAMINATION:  Physical Exam HENT:     Head: Normocephalic and  atraumatic.  Eyes:     Conjunctiva/sclera: Conjunctivae normal.     Pupils: Pupils are equal, round, and reactive to light.  Neck:     Thyroid: No thyromegaly.     Trachea: No tracheal deviation.  Cardiovascular:     Rate and Rhythm: Normal rate and regular rhythm.     Heart sounds: Normal heart sounds.  Pulmonary:     Effort: Pulmonary effort is normal. No respiratory distress.     Breath sounds: Normal breath sounds. No wheezing.  Chest:     Chest wall: No tenderness.  Abdominal:     General: Bowel sounds are normal. There is no distension.     Palpations: Abdomen is soft.     Tenderness: There is no abdominal tenderness.  Musculoskeletal:        General: Normal range of motion.     Cervical back: Normal range of motion and neck supple.  Skin:    General: Skin is warm and dry.     Findings: No rash.  Neurological:     Mental Status: She is alert and oriented to person, place, and time.     Cranial Nerves: No cranial nerve deficit.    LABORATORY PANEL:  Female CBC Recent Labs  Lab 01/19/19 1655  WBC 4.6  HGB 13.3  HCT 44.1  PLT 293   ------------------------------------------------------------------------------------------------------------------ Chemistries  Recent Labs  Lab 01/17/19 2006  NA 142  K 3.6  CL 105  CO2 24  GLUCOSE 125*  BUN 49*  CREATININE 2.41*  CALCIUM 9.8   RADIOLOGY:  ECHOCARDIOGRAM COMPLETE  Result Date: 01/19/2019   ECHOCARDIOGRAM REPORT   Patient Name:  Katelyn Lane Date of Exam: 01/19/2019 Medical Rec #:  [id number]     Height:       66.0 in Accession #:    [id number]    Weight:       221.0 lb Date of Birth:  August 22, 1955    BSA:          2.09 m Patient Age:    64 years      BP:           112/80 mmHg Patient Gender: F             HR:           62 bpm. Exam Location:  [facility] Procedure: 2D Echo, Color Doppler, Cardiac Doppler and Intracardiac            Opacification Agent Indications:     I63.9 Stroke  History:         Patient has prior  history of Echocardiogram examinations, most                  recent 12/29/2018. CHF; Risk Factors:Dyslipidemia.  Sonographer:     Charmayne Sheer RDCS (AE) Referring Phys:  [id number] Francis Diagnosing Phys: Neoma Laming MD IMPRESSIONS  1. Left ventricular ejection fraction, by visual estimation, is <20%. The left ventricle has severely decreased function. There is no left ventricular hypertrophy.LVEF 6 PERCENT  2. Definity contrast agent was given IV to delineate the left ventricular endocardial borders.  3. Large, fixed thrombus on the lateral wall of the left ventricle.  4. Left ventricular diastolic parameters are consistent with Grade III diastolic dysfunction (restrictive).  5. Severely dilated left ventricular internal cavity size.  6. The left ventricle demonstrates global hypokinesis.  7. Global right ventricle has normal systolic function.The right ventricular size is severely enlarged. No increase in right ventricular wall thickness.  8. Left atrial size was severely dilated.  9. Right atrial size was severely dilated. 10. The mitral valve is normal in structure. Trivial mitral valve regurgitation. No evidence of mitral stenosis. 11. The tricuspid valve is normal in structure. 12. The aortic valve is normal in structure. Aortic valve regurgitation is not visualized. No evidence of aortic valve sclerosis or stenosis. 13. The pulmonic valve was normal in structure. Pulmonic valve regurgitation is not visualized. 14. Moderately elevated pulmonary artery systolic pressure. 15. The inferior vena cava is normal in size with greater than 50% respiratory variability, suggesting right atrial pressure of 3 mmHg. FINDINGS  Left Ventricle: Left ventricular ejection fraction, by visual estimation, is <20%. The left ventricle has severely decreased function. Definity contrast agent was given IV to delineate the left ventricular endocardial borders. The left ventricle demonstrates global hypokinesis. The left  ventricular internal cavity size was severely dilated left ventricle. There is no left ventricular hypertrophy. Left ventricular diastolic parameters are consistent with Grade III diastolic dysfunction (restrictive). Normal left atrial pressure. There is a large, fixed, lateral left ventricular thrombus. The thrombus appears protruding and regular in shape and solid in texture. Right Ventricle: The right ventricular size is severely enlarged. No increase in right ventricular wall thickness. Global RV systolic function is has normal systolic function. The tricuspid regurgitant velocity is 2.85 m/s, and with an assumed right atrial pressure of 10 mmHg, the estimated right ventricular systolic pressure is moderately elevated at 42.5 mmHg. Left Atrium: Left atrial size was severely dilated. Right Atrium: Right atrial size was severely dilated Pericardium: There is no evidence of pericardial effusion. Mitral Valve: The  mitral valve is normal in structure. Trivial mitral valve regurgitation. No evidence of mitral valve stenosis by observation. MV peak gradient, 3.9 mmHg. Tricuspid Valve: The tricuspid valve is normal in structure. Tricuspid valve regurgitation is mild. Aortic Valve: The aortic valve is normal in structure. Aortic valve regurgitation is not visualized. The aortic valve is structurally normal, with no evidence of sclerosis or stenosis. Aortic valve mean gradient measures 4.0 mmHg. Aortic valve peak gradient measures 7.3 mmHg. Aortic valve area, by VTI measures 2.03 cm. Pulmonic Valve: The pulmonic valve was normal in structure. Pulmonic valve regurgitation is not visualized. Pulmonic regurgitation is not visualized. Aorta: The aortic root, ascending aorta and aortic arch are all structurally normal, with no evidence of dilitation or obstruction. Venous: The inferior vena cava is normal in size with greater than 50% respiratory variability, suggesting right atrial pressure of 3 mmHg. IAS/Shunts: No atrial  level shunt detected by color flow Doppler. There is no evidence of a patent foramen ovale. No ventricular septal defect is seen or detected. There is no evidence of an atrial septal defect.  LEFT VENTRICLE PLAX 2D LVIDd:         6.50 cm  Diastology LVIDs:         6.28 cm  LV e' lateral:   4.90 cm/s LV PW:         1.22 cm  LV E/e' lateral: 19.9 LV IVS:        0.91 cm  LV e' medial:    4.57 cm/s LVOT diam:     2.40 cm  LV E/e' medial:  21.3 LV SV:         17 ml LV SV Index:   7.55 LVOT Area:     4.52 cm  RIGHT VENTRICLE RV Basal diam:  4.40 cm LEFT ATRIUM           Index       RIGHT ATRIUM           Index LA diam:      4.50 cm 2.16 cm/m  RA Area:     15.40 cm LA Vol (A4C): 92.0 ml 44.09 ml/m RA Volume:   38.90 ml  18.64 ml/m  AORTIC VALVE                    PULMONIC VALVE AV Area (Vmax):    2.45 cm     PV Vmax:       0.58 m/s AV Area (Vmean):   2.15 cm     PV Vmean:      40.800 cm/s AV Area (VTI):     2.03 cm     PV VTI:        0.098 m AV Vmax:           135.00 cm/s  PV Peak grad:  1.3 mmHg AV Vmean:          101.000 cm/s PV Mean grad:  1.0 mmHg AV VTI:            0.252 m AV Peak Grad:      7.3 mmHg AV Mean Grad:      4.0 mmHg LVOT Vmax:         73.20 cm/s LVOT Vmean:        47.900 cm/s LVOT VTI:          0.113 m LVOT/AV VTI ratio: 0.45  AORTA Ao Root diam: 3.20 cm MITRAL VALVE  TRICUSPID VALVE MV Area (PHT): 4.06 cm             TR Peak grad:   32.5 mmHg MV Peak grad:  3.9 mmHg             TR Vmax:        285.00 cm/s MV Mean grad:  1.0 mmHg MV Vmax:       0.99 m/s             SHUNTS MV Vmean:      48.8 cm/s            Systemic VTI:  0.11 m MV VTI:        0.21 m               Systemic Diam: 2.40 cm MV PHT:        54.23 msec MV Decel Time: 187 msec MV E velocity: 97.50 cm/s 103 cm/s MV A velocity: 80.00 cm/s 70.3 cm/s MV E/A ratio:  1.22       1.5  Neoma Laming MD Electronically signed by Neoma Laming MD Signature Date/Time: 01/19/2019/2:46:46 PM    Final    ASSESSMENT AND PLAN:  Tanji Kuether is a 64 y.o. female with medical history significant for HFrEF/dilated cardiomyopathy with EF 15%, hypertension, hyperlipidemia, CKD stage III, depression, and substance use (tobacco/cocaine) who is admitted with acute stroke.  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  now back to baseline  MRI brain shows small cortical/subcortical acute infarct with involvement of left primary sensorimotor cortex.  arrived outside tPA window on 01/17/19.  - Carotid doppler showed with mild atehrosclerosis <50 stenosis b/l.  - 2 D echo shows EF less than 20% and large left ventricle thrombus seen.  - Neuro recommends starting patient on Coumadin and bridging with heparin drip, nonbolus protocol. LDL 124 HBAIC 5.9 UDS: cocaine +ve  AKI on CKD stage III: Holding home spironolactone and Lasix.  Recheck labs in a.m.  HFrEF/dilated cardiomyopathy: Echocardiogram 12/29/2018 showed EF [id number], grade 3 diastolic dysfunction, global LV hypokinesis.  Right/left heart cath 12/31/2018 showed severe dilated cardiomyopathy with EF 15% and minimal atherosclerosis of left circumflex and right coronary.  Appears euvolemic and well compensated. -Holding home Coreg, spironolactone, and Lasix to allow for permissive hypertension in setting of acute CVA - cardio ok to resume these meds if ok by Neuro -Monitor strict I/O's and daily weights  Hypertension: Holding Coreg, spironolactone, Lasix as above.  Hyperlipidemia:  atorvastatin 40 mg daily.  Depression: Continue fluoxetine.  Tobacco use: Patient counseled on smoking cessation.  Substance use: Patient counseled on cessation from cocaine use     All the records are reviewed and case discussed with Care Management/Social Worker. Management plans discussed with the patient, Neuro, cardio and they are in agreement.  CODE STATUS: DNR  TOTAL TIME TAKING  CARE OF THIS PATIENT: 35 minutes.   More than 50% of the time was spent in counseling/coordination of care: YES  POSSIBLE D/C IN 3-4 DAYS, DEPENDING ON CLINICAL CONDITION. And therapeutic INR   Max Sane M.D on 01/19/2019 at 8:39 PM  Between 7am to 6pm - Pager - [phone]  After 6pm go to [email or web address] - password [email or web address]  [facility]   CC: Primary care physician; Revelo, Elyse Jarvis, MD  Note: This dictation was prepared with Dragon dictation along with smaller phrase technology. Any transcriptional errors that result from this process are unintentional.

## 2019-01-19 NOTE — Plan of Care (Signed)
  Problem: Education: Goal: Knowledge of General Education information will improve Description: Including pain rating scale, medication(s)/side effects and non-pharmacologic comfort measures Outcome: Progressing   Problem: Health Behavior/Discharge Planning: Goal: Ability to manage health-related needs will improve Outcome: Progressing   Problem: Clinical Measurements: Goal: Ability to maintain clinical measurements within normal limits will improve Outcome: Progressing Goal: Will remain free from infection Outcome: Progressing Goal: Diagnostic test results will improve Outcome: Progressing Goal: Respiratory complications will improve Outcome: Progressing Goal: Cardiovascular complication will be avoided Outcome: Progressing   Problem: Activity: Goal: Risk for activity intolerance will decrease Outcome: Progressing   Problem: Nutrition: Goal: Adequate nutrition will be maintained Outcome: Progressing   Problem: Coping: Goal: Level of anxiety will decrease Outcome: Progressing   Problem: Elimination: Goal: Will not experience complications related to bowel motility Outcome: Progressing Goal: Will not experience complications related to urinary retention Outcome: Progressing   Problem: Pain Managment: Goal: General experience of comfort will improve Outcome: Progressing   Problem: Safety: Goal: Ability to remain free from injury will improve Outcome: Progressing   Problem: Skin Integrity: Goal: Risk for impaired skin integrity will decrease Outcome: Progressing   Problem: Education: Goal: Knowledge of disease or condition will improve Outcome: Progressing Goal: Knowledge of secondary prevention will improve Outcome: Progressing Goal: Knowledge of patient specific risk factors addressed and post discharge goals established will improve Outcome: Progressing   Problem: Coping: Goal: Will verbalize positive feelings about self Outcome: Progressing Goal: Will  identify appropriate support needs Outcome: Progressing   Problem: Health Behavior/Discharge Planning: Goal: Ability to manage health-related needs will improve Outcome: Progressing   Problem: Nutrition: Goal: Risk of aspiration will decrease Outcome: Progressing

## 2019-01-19 NOTE — Progress Notes (Signed)
*  PRELIMINARY RESULTS* Echocardiogram 2D Echocardiogram has been performed.  Katelyn Lane 01/19/2019, 10:56 AM

## 2019-01-19 NOTE — Progress Notes (Signed)
ANTICOAGULATION CONSULT NOTE - Initial Consult  Pharmacy Consult for warfarin Indication: embolic stroke  Allergies  Allergen Reactions  . Penicillins Hives, Rash and Other (See Comments)    Did it involve swelling of the face/tongue/throat, SOB, or low BP? No Did it involve sudden or severe rash/hives, skin peeling, or any reaction on the inside of your mouth or nose? Yes Did you need to seek medical attention at a hospital or doctor's office? Yes When did it last happen? >25 years If all above answers are "NO", may proceed with cephalosporin use.   . Ace Inhibitors Nausea And Vomiting    Patient Measurements: Height: 5\' 6"  (167.6 cm) Weight: 221 lb (100.2 kg) IBW/kg (Calculated) : 59.3   Vital Signs: Temp: 98 F (36.7 C) (01/11 0753) Temp Source: Oral (01/11 0150) BP: 123/79 (01/11 1214) Pulse Rate: 70 (01/11 1214)  Labs: Recent Labs    01/17/19 2006 01/17/19 2325 01/18/19 0530  HGB 13.4  --   --   HCT 42.9  --   --   PLT 334  --   --   APTT  --  28  --   LABPROT  --  12.6  --   INR  --  1.0  --   CREATININE 2.41*  --   --   TROPONINIHS  --   --  37*    Estimated Creatinine Clearance: 28.6 mL/min (A) (by C-G formula based on SCr of 2.41 mg/dL (H)).   Assessment: 64 yo female here with acute stroke to start on warfarin  Per MRI of brain no hemorrhage. 1/9 baseline INR 1.0   Goal of Therapy:  INR 2-3 Monitor platelets by anticoagulation protocol: Yes   Plan:  Warfarin 7.5 mg PO x1 tonight F/u INR in AM - daily INR CBC at least every three days per policy  Pharmacy will continue to follow  [name] 01/19/2019,1:02 PM

## 2019-01-19 NOTE — Progress Notes (Addendum)
Reason for consult: Stroke   Subjective:Patient is close to her baseline, apart from mild numbness of right side of her face. She is anxious to go home.    ROS: negative except above  Examination  Vital signs in last 24 hours: Temp:  [97.4 F (36.3 C)-98 F (36.7 C)] 98 F (36.7 C) (01/11 0753) Pulse Rate:  [60-85] 70 (01/11 1214) Resp:  [16-20] 18 (01/11 0753) BP: (106-149)/(79-109) 123/79 (01/11 1214) SpO2:  [99 %-100 %] 100 % (01/11 0753)  General: sitting up in her chair  CVS: pulse-normal rate and rhythm RS: breathing comfortably Extremities: normal   Neuro: MS: Alert, oriented, follows commands CN: pupils equal and reactive,  EOMI, face symmetric, tongue midline, reduced sensation over right half of face Motor: 5/5 strength in all 4 extremities Reflexes: 2+ bilaterally over patella, biceps, plantars: flexor Coordination: normal Gait: no ataxia noted    Basic Metabolic Panel: Recent Labs  Lab 01/13/19 2223 01/15/19 0233 01/17/19 2006  NA 141 142 142  K 3.9 3.5 3.6  CL 111 109 105  CO2 22 21* 24  GLUCOSE 151* 128* 125*  BUN 37* 30* 49*  CREATININE 2.10* 1.87* 2.41*  CALCIUM 8.8* 9.5 9.8    CBC: Recent Labs  Lab 01/13/19 2223 01/15/19 0120 01/17/19 2006  WBC 5.6 4.9 4.9  NEUTROABS  --   --  3.0  HGB 12.2 12.3 13.4  HCT 40.3 40.5 42.9  MCV 86.7 85.6 85.3  PLT 371 345 334     Coagulation Studies: Recent Labs    01/17/19 2325  LABPROT 12.6  INR 1.0    Imaging Reviewed:     ASSESSMENT AND PLAN  27 y F with PMH of HTN, CHF, cocaine abuse presenting with R facial numbness and aphasia and dysarthria, arrived outside tPA window on 01/17/19. Initial NIHSS 3, has now improved to 1.   Workup revealed cortical/subcortical acute infarction in left primary sensorimotor cortex on MRI brain.  Carotid doppler showed with mild atehrosclerosis <50 stenosis b/l. CTA head and neck not done due to impaired renal function.  Echo: pending  LDL 124 HBAIC  5.9 UDS: cocaine +ve  Left Cortical Acute Ischemic Stroke  Chronic systolic heart failure with Low EF   Etiology: likely cardioembolic   Recommendations F/u ECHO, discuss with cardiology for TEE if TTE negative to look for  LV thrombus Consider starting anticoagulation with Coumadin for cardioembolic stroke in the setting of very low EF ( <less than 20 % in Echo performed on 12/21), if Echo suggestive of thrombus will need to bridge with heparin/Lovenox until coumadin therapeutic. Otherwise can d/c with Coumadin/ ASA 81mg .  High dose statin for hyperlipidemia BP goal less than [id number] systolic  F/U PT/OT/speech recommendations, likely can go home on discharge with no impatient recs    Karena Addison Anissia Wessells [facility] Pager Number [phone] For questions after 7pm please refer to AMION to reach the Neurologist on call

## 2019-01-19 NOTE — Progress Notes (Signed)
Reviewed echo report, EF less than 20% and large left ventricle thrombus seen.  Recommend starting patient on Coumadin and bridging with heparin drip, nonbolus protocol. We will also recommend cardiology consult for any further recommendations

## 2019-01-19 NOTE — Progress Notes (Signed)
SLP Cancellation Note  Patient Details Name: Katelyn Lane MRN: [id number] DOB: Nov 28, 1955   Cancelled treatment:       Reason Eval/Treat Not Completed: SLP screened, no needs identified, will sign off(chart reviewed; consulted NSG then MD. Met w/ pt in room). Pt was initially sitting in room conversing w/ MD. Noted MRI results, labs. Briefly heard their discussion of pt asking/answering questions fairly adequately to meet her needs. Unsure of pt's baseline Cognitive status. Noted per chart notes at admission pt was positive for Cocaine use. Post MD leaving the room, pt walked to the door independently asking for her "other MD". Pt stated she had questions about her medications to ask him. Back in her room, SLP asked if pt had ordered Lunch; pt stated she knew the number(repeated number written on the board) and that she would call them "later". She then sat down and began writing something on a piece of paper talking to herself. Pt denied any needs to SLP. Pt denied any difficulty swallowing and is currently on a regular diet; tolerates swallowing pills w/ water per NSG. Pt conversed w/out gross speech deficits noted; pt's speech was intelligible. NSG agreed. Pt still c/o min facial tingling/numbness at times to [place]. Due to being unsure of pt's baseline Cognitive status, recommend f/u formal Cognitive-linguistic assessment post discharge home if any changes/decline from her baseline noted. No further skilled ST services indicated as pt appears at her baseline. Pt agreed. NSG to reconsult if any change in status while admitted.     [name], MS, CCC-SLP Tabby Beaston 01/19/2019, 12:01 PM

## 2019-01-19 NOTE — Progress Notes (Signed)
OT Cancellation Note  Patient Details Name: Katelyn Lane MRN: [id number] DOB: Mar 25, 1955   Cancelled Treatment:    Reason Eval/Treat Not Completed: OT screened, no needs identified, will sign off  Order received for OT evaluation and chart reviewed.  Pt has been getting up and going to the bathroom with supervision and is completing ADLs at baseline.  No need for OT evaluation and screening only provided.  Thank you for the referral.  Chrys Racer, OTR/L, [place] [id number] 01/19/19, 9:32 AM

## 2019-01-19 NOTE — Consult Note (Signed)
[facility]  CARDIOLOGY CONSULT NOTE  Patient ID: Katelyn Lane MRN: [id number] DOB/AGE: 1955/09/30 64 y.o.  Admit date: 01/17/2019 Referring Physician Manuella Ghazi Primary Physician [facility] Primary Cardiologist Nehemiah Massed Reason for Consultation congestive heart failure, LV thrombus  HPI: 64 year old female referred for evaluation of congestive heart failure, and LV thrombus.  She was admitted/10/2019 with chief complaint of slurred speech and right sided facial numbness and droop.  MRI revealed small cortical/subcortical acute infarct.  Speech has improved.  2D echocardiogram today revealed severe dilated cardiomyopathy, with LVEF less than 20%, with left ventricular thrombus.  Patient was recently hospitalized 12/29/2018 with acute systolic congestive heart failure at which time she was first noted to have dilated cardiomyopathy.  Underwent right and left heart cardiac catheterization 12/30/2018 which revealed later cardiomyopathy with LVEF of 15%, insignificant coronary artery disease, and hemodynamic findings consistent with moderate pulmonary hypertension.  Patient was prescribed carvedilol, isosorbide mononitrate, hydralazine and spironolactone though it is unclear whether the patient has been taking her medications.  Of note, patient has known polysubstance abuse, with recent cocaine use, and tobacco abuse.  The patient has been seen by neurology, who recommends heparin drip without bolus, and chronic anticoagulation with warfarin.  Review of systems complete and found to be negative unless listed above     Past Medical History:  Diagnosis Date  . CHF (congestive heart failure) ([place])   . Hyperlipidemia   . Renal disorder     Past Surgical History:  Procedure Laterality Date  . RIGHT/LEFT HEART CATH AND CORONARY ANGIOGRAPHY N/A 12/30/2018   Procedure: RIGHT/LEFT HEART CATH AND CORONARY ANGIOGRAPHY;  Surgeon: Corey Skains, MD;  Location: [place] [facility];  Service:  Cardiovascular;  Laterality: N/A;    Medications Prior to Admission  Medication Sig Dispense Refill Last Dose  . albuterol (VENTOLIN HFA) 108 (90 Base) MCG/ACT inhaler Inhale 2 puffs into the lungs every 6 (six) hours as needed for wheezing or shortness of breath. 8 g 0 prn at prn  . aspirin 81 MG EC tablet Take 1 tablet (81 mg total) by mouth daily. 30 tablet 1 01/17/2019 at Unknown time  . atorvastatin (LIPITOR) 20 MG tablet Take 20 mg by mouth daily at 6 PM.    01/17/2019 at Unknown time  . carvedilol (COREG) 3.125 MG tablet Take 1 tablet (3.125 mg total) by mouth 2 (two) times daily with a meal.   01/17/2019 at 0600  . FLUoxetine (PROZAC) 40 MG capsule Take 40 mg by mouth daily.    01/17/2019 at Unknown time  . furosemide (LASIX) 40 MG tablet Take 1 tablet (40 mg total) by mouth 2 (two) times daily. 60 tablet 1 01/17/2019 at Unknown time  . isosorbide mononitrate (IMDUR) 30 MG 24 hr tablet Take 0.5 tablets (15 mg total) by mouth daily. 30 tablet 1 01/17/2019 at Unknown time  . loratadine (CLARITIN) 10 MG tablet Take 1 tablet (10 mg total) by mouth daily. (Patient taking differently: Take 10 mg by mouth daily as needed for itching. ) 30 tablet 1 prn at prn  . nitroGLYCERIN (NITROSTAT) 0.4 MG SL tablet Place 1 tablet (0.4 mg total) under the tongue every 5 (five) minutes as needed for chest pain. 30 tablet 1 prn at prn  . spironolactone (ALDACTONE) 25 MG tablet Take 0.5 tablets (12.5 mg total) by mouth daily. 30 tablet 1 01/17/2019 at Unknown time  . traZODone (DESYREL) 100 MG tablet Take 150 mg by mouth at bedtime as needed for sleep.    prn at  prn   Social History   Socioeconomic History  . Marital status: Single    Spouse name: Not on file  . Number of children: Not on file  . Years of education: Not on file  . Highest education level: Not on file  Occupational History  . Not on file  Tobacco Use  . Smoking status: Current Every Day Smoker  . Smokeless tobacco: Never Used  Substance and Sexual  Activity  . Alcohol use: No    Alcohol/week: 0.0 standard drinks  . Drug use: Yes    Types: Cocaine  . Sexual activity: Not on file  Other Topics Concern  . Not on file  Social History Narrative  . Not on file   Social Determinants of Health   Financial Resource Strain:   . Difficulty of Paying Living Expenses: Not on file  Food Insecurity:   . Worried About Charity fundraiser in the Last Year: Not on file  . Ran Out of Food in the Last Year: Not on file  Transportation Needs:   . Lack of Transportation (Medical): Not on file  . Lack of Transportation (Non-Medical): Not on file  Physical Activity:   . Days of Exercise per Week: Not on file  . Minutes of Exercise per Session: Not on file  Stress:   . Feeling of Stress : Not on file  Social Connections:   . Frequency of Communication with Friends and Family: Not on file  . Frequency of Social Gatherings with Friends and Family: Not on file  . Attends Religious Services: Not on file  . Active Member of Clubs or Organizations: Not on file  . Attends Archivist Meetings: Not on file  . Marital Status: Not on file  Intimate Partner Violence:   . Fear of Current or Ex-Partner: Not on file  . Emotionally Abused: Not on file  . Physically Abused: Not on file  . Sexually Abused: Not on file    Family History  Problem Relation Age of Onset  . Breast cancer Neg Hx       Review of systems complete and found to be negative unless listed above      PHYSICAL EXAM  General: Well developed, well nourished, in no acute distress HEENT:  Normocephalic and atramatic Neck:  No JVD.  Lungs: Clear bilaterally to auscultation and percussion. Heart: HRRR . Normal S1 and S2 without gallops or murmurs.  Abdomen: Bowel sounds are positive, abdomen soft and non-tender  Msk:  Back normal, normal gait. Normal strength and tone for age. Extremities: No clubbing, cyanosis or edema.   Neuro: Alert and oriented X 3. Psych:  Good  affect, responds appropriately  Labs:   Lab Results  Component Value Date   WBC 4.9 01/17/2019   HGB 13.4 01/17/2019   HCT 42.9 01/17/2019   MCV 85.3 01/17/2019   PLT 334 01/17/2019    Recent Labs  Lab 01/17/19 2006  NA 142  K 3.6  CL 105  CO2 24  BUN 49*  CREATININE 2.41*  CALCIUM 9.8  GLUCOSE 125*   No results found for: CKTOTAL, CKMB, CKMBINDEX, TROPONINI  Lab Results  Component Value Date   CHOL 228 (H) 01/18/2019   Lab Results  Component Value Date   HDL 46 01/18/2019   Lab Results  Component Value Date   LDLCALC 134 (H) 01/18/2019   Lab Results  Component Value Date   TRIG 242 (H) 01/18/2019   Lab Results  Component Value  Date   CHOLHDL 5.0 01/18/2019   No results found for: LDLDIRECT    Radiology: DG Chest 2 View  Result Date: 01/15/2019 CLINICAL DATA:  Shortness of breath EXAM: CHEST - 2 VIEW COMPARISON:  Radiograph 01/13/2019 FINDINGS: Mild cardiomegaly with the appearance of left atrial enlargement given a double density sign. There is improving central congestion with mild cephalization of the vascularity and diminished interstitial opacity. No pneumothorax or visible effusion. No focal consolidation. No acute osseous or soft tissue abnormality. Degenerative changes are present in the imaged spine and shoulders. IMPRESSION: Improving features of with only mild residual edema with stable cardiomegaly. Electronically Signed   By: Lovena Le M.D.   On: 01/15/2019 03:56   MR Brain Wo Contrast (neuro protocol)  Result Date: 01/17/2019 CLINICAL DATA:  Speech abnormality EXAM: MRI HEAD WITHOUT CONTRAST TECHNIQUE: Multiplanar, multiecho pulse sequences of the brain and surrounding structures were obtained without intravenous contrast. COMPARISON:  None. FINDINGS: Brain: There is cortical/subcortical reduced diffusion with involvement of the left precentral and postcentral gyri lateral to the hand motor region and therefore likely involving the face. Scattered  small foci of T2 hyperintensity in the supratentorial white matter are nonspecific but may reflect mild chronic microvascular ischemic changes. Small chronic right cerebellar infarct. No evidence of intracranial hemorrhage. There is no intracranial mass, mass effect, or edema. There is no hydrocephalus or extra-axial fluid collection. Vascular: Major vessel flow voids at the skull base are preserved. Skull and upper cervical spine: Normal marrow signal is preserved. Sinuses/Orbits: Paranasal sinuses are aerated. Orbits are unremarkable. Other: Sella is unremarkable.  Mastoid air cells are clear. IMPRESSION: Small cortical/subcortical acute infarction with involvement of left primary sensorimotor cortex. No hemorrhage. Mild chronic microvascular ischemic changes. Electronically Signed   By: Macy Mis M.D.   On: 01/17/2019 21:30   CARDIAC CATHETERIZATION  Result Date: 12/30/2018  Mid RCA lesion is 10% stenosed.  Prox Cx lesion is 30% stenosed.  Hemodynamic findings consistent with moderate pulmonary hypertension.  64 year old female with hypertension hyperlipidemia having progressive episodes of shortness of breath PND orthopnea and congestive heart failure without evidence of myocardial infarction LV angiogram showing severe dilated cardiomyopathy with ejection fraction of 15% Coronary angiogram showing minimal atherosclerosis of left circumflex and right coronary Significant elevation of left end-diastolic pressures, pulmonary pressures, pulmonary capillary wedge pressures Plan Continue intravenous Lasix for volume improvements of significant pulmonary edema and congestive heart failure No further cardiac intervention and/or diagnostics necessary at this time Begin beta-blocker, isosorbide, hydralazine, spironolactone for acute systolic dysfunction congestive heart failure No use of ACE inhibitor or angiotensin receptor blocker due to side effects and/or allergies Begin cardiac rehabilitation   US  Carotid Bilateral (at [facility] and [facility] only)  Result Date: 01/18/2019 CLINICAL DATA:  Stroke. EXAM: BILATERAL CAROTID DUPLEX ULTRASOUND TECHNIQUE: Pearline Cables scale imaging, color Doppler and duplex ultrasound were performed of bilateral carotid and vertebral arteries in the neck. COMPARISON:  None. FINDINGS: Criteria: Quantification of carotid stenosis is based on velocity parameters that correlate the residual internal carotid diameter with NASCET-based stenosis levels, using the diameter of the distal internal carotid lumen as the denominator for stenosis measurement. The following velocity measurements were obtained: RIGHT ICA: 53/28 cm/sec CCA: [id number] cm/sec SYSTOLIC ICA/CCA RATIO:  1.2 ECA: 52 cm/sec LEFT ICA: 45/24 cm/sec CCA: [id number] cm/sec SYSTOLIC ICA/CCA RATIO:  0.9 ECA: 47 cm/sec RIGHT CAROTID ARTERY: Echogenic plaque at the right carotid bulb. External carotid artery is patent with normal waveform. Small amount of echogenic plaque in the proximal  internal carotid artery. Normal waveforms and velocities in the internal carotid artery. RIGHT VERTEBRAL ARTERY: Antegrade flow and normal waveform in the right vertebral artery. LEFT CAROTID ARTERY: Small amount of plaque at the left carotid bulb. External carotid artery is patent with normal waveform. Minimal plaque in the proximal internal carotid artery. Normal waveforms and velocities in the internal carotid artery. LEFT VERTEBRAL ARTERY: Antegrade flow and normal waveform in the left vertebral artery. IMPRESSION: Mild atherosclerotic disease in the carotid arteries. Estimated degree of stenosis in the internal carotid arteries is less than 50% bilaterally. Patent vertebral arteries with antegrade flow. Electronically Signed   By: Markus Daft M.D.   On: 01/18/2019 11:37   DG Chest Port 1 View  Result Date: 01/13/2019 CLINICAL DATA:  Shortness of breath EXAM: PORTABLE CHEST 1 VIEW COMPARISON:  12/29/2018 FINDINGS: Cardiac shadow remains enlarged. Central vascular  congestion is noted. Mild edema is noted similar to that seen on the prior exam. No focal infiltrate or effusion is noted. IMPRESSION: Mild changes of CHF. Electronically Signed   By: Inez Catalina M.D.   On: 01/13/2019 22:44   DG Chest Portable 1 View  Result Date: 12/29/2018 CLINICAL DATA:  Shortness of breath EXAM: PORTABLE CHEST 1 VIEW COMPARISON:  10/18/2016 FINDINGS: Mild cardiomegaly. There is mild interstitial pulmonary edema. No focal consolidation. No pneumothorax or sizable pleural effusion. IMPRESSION: Cardiomegaly with mild interstitial pulmonary edema. Electronically Signed   By: Ulyses Jarred M.D.   On: 12/29/2018 01:27   ECHOCARDIOGRAM COMPLETE  Result Date: 01/19/2019   ECHOCARDIOGRAM REPORT   Patient Name:   ANNA-MARIE BAUMBACH Date of Exam: 01/19/2019 Medical Rec #:  [id number]     Height:       66.0 in Accession #:    [id number]    Weight:       221.0 lb Date of Birth:  [birth year]    BSA:          2.09 m Patient Age:    41 years      BP:           112/80 mmHg Patient Gender: F             HR:           62 bpm. Exam Location:  [facility] Procedure: 2D Echo, Color Doppler, Cardiac Doppler and Intracardiac            Opacification Agent Indications:     I63.9 Stroke  History:         Patient has prior history of Echocardiogram examinations, most                  recent 12/29/2018. CHF; Risk Factors:Dyslipidemia.  Sonographer:     Charmayne Sheer RDCS (AE) Referring Phys:  [id number] Diagnosing Phys: Neoma Laming MD IMPRESSIONS  1. Left ventricular ejection fraction, by visual estimation, is <20%. The left ventricle has severely decreased function. There is no left ventricular hypertrophy.LVEF 6 PERCENT  2. Definity contrast agent was given IV to delineate the left ventricular endocardial borders.  3. Large, fixed thrombus on the lateral wall of the left ventricle.  4. Left ventricular diastolic parameters are consistent with Grade III diastolic dysfunction (restrictive).  5. Severely dilated left  ventricular internal cavity size.  6. The left ventricle demonstrates global hypokinesis.  7. Global right ventricle has normal systolic function.The right ventricular size is severely enlarged. No increase in right ventricular wall thickness.  8. Left atrial size was severely dilated.  9. Right atrial size was severely dilated. 10. The mitral valve is normal in structure. Trivial mitral valve regurgitation. No evidence of mitral stenosis. 11. The tricuspid valve is normal in structure. 12. The aortic valve is normal in structure. Aortic valve regurgitation is not visualized. No evidence of aortic valve sclerosis or stenosis. 13. The pulmonic valve was normal in structure. Pulmonic valve regurgitation is not visualized. 14. Moderately elevated pulmonary artery systolic pressure. 15. The inferior vena cava is normal in size with greater than 50% respiratory variability, suggesting right atrial pressure of 3 mmHg. FINDINGS  Left Ventricle: Left ventricular ejection fraction, by visual estimation, is <20%. The left ventricle has severely decreased function. Definity contrast agent was given IV to delineate the left ventricular endocardial borders. The left ventricle demonstrates global hypokinesis. The left ventricular internal cavity size was severely dilated left ventricle. There is no left ventricular hypertrophy. Left ventricular diastolic parameters are consistent with Grade III diastolic dysfunction (restrictive). Normal left atrial pressure. There is a large, fixed, lateral left ventricular thrombus. The thrombus appears protruding and regular in shape and solid in texture. Right Ventricle: The right ventricular size is severely enlarged. No increase in right ventricular wall thickness. Global RV systolic function is has normal systolic function. The tricuspid regurgitant velocity is 2.85 m/s, and with an assumed right atrial pressure of 10 mmHg, the estimated right ventricular systolic pressure is moderately  elevated at 42.5 mmHg. Left Atrium: Left atrial size was severely dilated. Right Atrium: Right atrial size was severely dilated Pericardium: There is no evidence of pericardial effusion. Mitral Valve: The mitral valve is normal in structure. Trivial mitral valve regurgitation. No evidence of mitral valve stenosis by observation. MV peak gradient, 3.9 mmHg. Tricuspid Valve: The tricuspid valve is normal in structure. Tricuspid valve regurgitation is mild. Aortic Valve: The aortic valve is normal in structure. Aortic valve regurgitation is not visualized. The aortic valve is structurally normal, with no evidence of sclerosis or stenosis. Aortic valve mean gradient measures 4.0 mmHg. Aortic valve peak gradient measures 7.3 mmHg. Aortic valve area, by VTI measures 2.03 cm. Pulmonic Valve: The pulmonic valve was normal in structure. Pulmonic valve regurgitation is not visualized. Pulmonic regurgitation is not visualized. Aorta: The aortic root, ascending aorta and aortic arch are all structurally normal, with no evidence of dilitation or obstruction. Venous: The inferior vena cava is normal in size with greater than 50% respiratory variability, suggesting right atrial pressure of 3 mmHg. IAS/Shunts: No atrial level shunt detected by color flow Doppler. There is no evidence of a patent foramen ovale. No ventricular septal defect is seen or detected. There is no evidence of an atrial septal defect.  LEFT VENTRICLE PLAX 2D LVIDd:         6.50 cm  Diastology LVIDs:         6.28 cm  LV e' lateral:   4.90 cm/s LV PW:         1.22 cm  LV E/e' lateral: 19.9 LV IVS:        0.91 cm  LV e' medial:    4.57 cm/s LVOT diam:     2.40 cm  LV E/e' medial:  21.3 LV SV:         17 ml LV SV Index:   7.55 LVOT Area:     4.52 cm  RIGHT VENTRICLE RV Basal diam:  4.40 cm LEFT ATRIUM           Index       RIGHT  ATRIUM           Index LA diam:      4.50 cm 2.16 cm/m  RA Area:     15.40 cm LA Vol (A4C): 92.0 ml 44.09 ml/m RA Volume:   38.90  ml  18.64 ml/m  AORTIC VALVE                    PULMONIC VALVE AV Area (Vmax):    2.45 cm     PV Vmax:       0.58 m/s AV Area (Vmean):   2.15 cm     PV Vmean:      40.800 cm/s AV Area (VTI):     2.03 cm     PV VTI:        0.098 m AV Vmax:           135.00 cm/s  PV Peak grad:  1.3 mmHg AV Vmean:          101.000 cm/s PV Mean grad:  1.0 mmHg AV VTI:            0.252 m AV Peak Grad:      7.3 mmHg AV Mean Grad:      4.0 mmHg LVOT Vmax:         73.20 cm/s LVOT Vmean:        47.900 cm/s LVOT VTI:          0.113 m LVOT/AV VTI ratio: 0.45  AORTA Ao Root diam: 3.20 cm MITRAL VALVE                        TRICUSPID VALVE MV Area (PHT): 4.06 cm             TR Peak grad:   32.5 mmHg MV Peak grad:  3.9 mmHg             TR Vmax:        285.00 cm/s MV Mean grad:  1.0 mmHg MV Vmax:       0.99 m/s             SHUNTS MV Vmean:      48.8 cm/s            Systemic VTI:  0.11 m MV VTI:        0.21 m               Systemic Diam: 2.40 cm MV PHT:        54.23 msec MV Decel Time: 187 msec MV E velocity: 97.50 cm/s 103 cm/s MV A velocity: 80.00 cm/s 70.3 cm/s MV E/A ratio:  1.22       1.5  Neoma Laming MD Electronically signed by Neoma Laming MD Signature Date/Time: 01/19/2019/2:46:46 PM    Final    ECHOCARDIOGRAM COMPLETE  Result Date: 12/29/2018   ECHOCARDIOGRAM REPORT   Patient Name:   MARNY GOSSARD Date of Exam: 12/29/2018 Medical Rec #:  [id number]     Height:       67.0 in Accession #:    [id number]    Weight:       225.0 lb Date of Birth:  [birth year]    BSA:          2.13 m Patient Age:    48 years      BP:           125/85 mmHg Patient Gender: F  HR:           61 bpm. Exam Location:  [facility] Procedure: 2D Echo, Cardiac Doppler and Color Doppler Indications:     CHF- acute diastolic [id number]  History:         Patient has no prior history of Echocardiogram examinations.                  Hyperlipidemia, renal disorder.  Sonographer:     [name] RDCS (AE) Referring Phys:  [id number] Arvella Merles MANSY Diagnosing Phys: Kathlyn Sacramento MD IMPRESSIONS  1. Left ventricular ejection fraction, by visual estimation, is <20%. The left ventricle has normal function. There is borderline left ventricular hypertrophy.  2. Left ventricular diastolic parameters are consistent with Grade III diastolic dysfunction (restrictive).  3. Severely dilated left ventricular internal cavity size.  4. The left ventricle demonstrates global hypokinesis.  5. Global right ventricle has normal systolic function.The right ventricular size is normal. No increase in right ventricular wall thickness.  6. Left atrial size was moderately dilated.  7. Right atrial size was normal.  8. Small pericardial effusion.  9. The pericardial effusion is posterior to the left ventricle. 10. The mitral valve is normal in structure. Mild to moderate mitral valve regurgitation. No evidence of mitral stenosis. 11. The tricuspid valve is normal in structure. Tricuspid valve regurgitation is trivial. 12. The aortic valve is normal in structure. Aortic valve regurgitation is not visualized. No evidence of aortic valve sclerosis or stenosis. 13. The pulmonic valve was normal in structure. Pulmonic valve regurgitation is not visualized. 14. TR signal is inadequate for assessing pulmonary artery systolic pressure. FINDINGS  Left Ventricle: Left ventricular ejection fraction, by visual estimation, is <20%. The left ventricle has normal function. The left ventricle demonstrates global hypokinesis. The left ventricular internal cavity size was severely dilated left ventricle.  There is borderline left ventricular hypertrophy. Left ventricular diastolic parameters are consistent with Grade III diastolic dysfunction (restrictive). Normal left atrial pressure. Right Ventricle: The right ventricular size is normal. No increase in right ventricular wall thickness. Global RV systolic function is has normal systolic function. The tricuspid regurgitant velocity is 2.70 m/s, and with an assumed right atrial  pressure  of 10 mmHg, the estimated right ventricular systolic pressure is TR signal is inadequate for assessing PA pressure at 39.1 mmHg. Left Atrium: Left atrial size was moderately dilated. Right Atrium: Right atrial size was normal in size Pericardium: A small pericardial effusion is present. The pericardial effusion is posterior to the left ventricle. Mitral Valve: The mitral valve is normal in structure. Mild to moderate mitral valve regurgitation. No evidence of mitral valve stenosis by observation. Tricuspid Valve: The tricuspid valve is normal in structure. Tricuspid valve regurgitation is trivial. Aortic Valve: The aortic valve is normal in structure. Aortic valve regurgitation is not visualized. The aortic valve is structurally normal, with no evidence of sclerosis or stenosis. Aortic valve mean gradient measures 5.0 mmHg. Aortic valve peak gradient measures 7.6 mmHg. Aortic valve area, by VTI measures 1.97 cm. Pulmonic Valve: The pulmonic valve was normal in structure. Pulmonic valve regurgitation is not visualized. Pulmonic regurgitation is not visualized. Aorta: The aortic root, ascending aorta and aortic arch are all structurally normal, with no evidence of dilitation or obstruction. Venous: The inferior vena cava was not well visualized. IAS/Shunts: No atrial level shunt detected by color flow Doppler. There is no evidence of a patent foramen ovale. No ventricular septal defect is seen or detected. There is no  evidence of an atrial septal defect.  LEFT VENTRICLE PLAX 2D LVIDd:         6.54 cm       Diastology LVIDs:         6.17 cm       LV e' lateral:   2.72 cm/s LV PW:         1.48 cm       LV E/e' lateral: 38.6 LV IVS:        0.85 cm       LV e' medial:    5.33 cm/s LVOT diam:     2.30 cm       LV E/e' medial:  19.7 LV SV:         27 ml LV SV Index:   12.15 LVOT Area:     4.15 cm  LV Volumes (MOD) LV area d, A2C:    62.40 cm LV area d, A4C:    55.70 cm LV area s, A2C:    52.10 cm LV area s,  A4C:    47.50 cm LV major d, A2C:   8.92 cm LV major d, A4C:   8.54 cm LV major s, A2C:   8.40 cm LV major s, A4C:   8.18 cm LV vol d, MOD A2C: 362.0 ml LV vol d, MOD A4C: 303.0 ml LV vol s, MOD A2C: 272.0 ml LV vol s, MOD A4C: 228.0 ml LV SV MOD A2C:     90.0 ml LV SV MOD A4C:     303.0 ml LV SV MOD BP:      85.8 ml RIGHT VENTRICLE RV Basal diam:  3.26 cm RV S prime:     10.10 cm/s TAPSE (M-mode): 4.0 cm LEFT ATRIUM              Index       RIGHT ATRIUM           Index LA diam:        5.10 cm  2.40 cm/m  RA Area:     15.00 cm LA Vol (A2C):   132.0 ml 62.09 ml/m RA Volume:   42.30 ml  19.90 ml/m LA Vol (A4C):   57.9 ml  27.23 ml/m LA Biplane Vol: 91.0 ml  42.80 ml/m  AORTIC VALVE                    PULMONIC VALVE AV Area (Vmax):    1.56 cm     RVOT Peak grad: 3 mmHg AV Area (Vmean):   1.48 cm AV Area (VTI):     1.97 cm AV Vmax:           137.50 cm/s AV Vmean:          100.650 cm/s AV VTI:            0.253 m AV Peak Grad:      7.6 mmHg AV Mean Grad:      5.0 mmHg LVOT Vmax:         51.70 cm/s LVOT Vmean:        35.900 cm/s LVOT VTI:          0.120 m LVOT/AV VTI ratio: 0.47  AORTA Ao Root diam: 2.70 cm MITRAL VALVE                         TRICUSPID VALVE MV Area (PHT): 3.77 cm  TR Peak grad:   29.1 mmHg MV PHT:        58.29 msec            TR Vmax:        270.00 cm/s MV Decel Time: 201 msec MV E velocity: 105.00 cm/s 103 cm/s  SHUNTS MV A velocity: 40.30 cm/s  70.3 cm/s Systemic VTI:  0.12 m MV E/A ratio:  2.61        1.5       Systemic Diam: 2.30 cm  Kathlyn Sacramento MD Electronically signed by Kathlyn Sacramento MD Signature Date/Time: 12/29/2018/1:55:43 PM    Final    CT HEAD CODE STROKE WO CONTRAST  Result Date: 01/17/2019 CLINICAL DATA:  Code stroke. EXAM: CT HEAD WITHOUT CONTRAST TECHNIQUE: Contiguous axial images were obtained from the base of the skull through the vertex without intravenous contrast. COMPARISON:  None. FINDINGS: Brain: There is no acute intracranial hemorrhage, mass-effect,  or edema. Gray-white differentiation is preserved. There is no extra-axial fluid collection. Ventricles and sulci are within normal limits in size and configuration. Vascular: No hyperdense vessel.There is atherosclerotic calcification at the skull base. Skull: Calvarium is unremarkable. Sinuses/Orbits: No acute finding. Other: None. ASPECTS [facility] Stroke Program Early CT Score) - Ganglionic level infarction (caudate, lentiform nuclei, internal capsule, insula, M1-M3 cortex): 7 - Supraganglionic infarction (M4-M6 cortex): 3 Total score (0-10 with 10 being normal): 10 IMPRESSION: 1. No acute intracranial hemorrhage or evidence of acute infarction. 2. ASPECTS is 10 These results were called by telephone at the time of interpretation on 01/17/2019 at 7:28 pm to provider [facility] , who verbally acknowledged these results. Electronically Signed   By: Macy Mis M.D.   On: 01/17/2019 19:29    EKG: Sinus rhythm with atypical RBBB  ASSESSMENT AND PLAN:   1.  CVA, MRI revealing small cortical/subcortical acute infarct, with initial dysarthria, right facial numbness and droop, improving, with 2D echocardiogram revealing left ventricular thrombus 2.  LV thrombus 3.  Chronic systolic congestive heart failure, with cardiomyopathy LVEF <20% 4.  Dilated cardiomyopathy / HFrEF 5.  Insignificant coronary artery disease 6.  Polysubstance abuse  Recommendations  1.  Agree with overall current therapy 2.  Agree with heparin drip without bolus per neurology 3.  Agree with chronic anticoagulation with Coumadin, target INR 2.0-3.0 4.  Resume carvedilol, isosorbide mononitrate, spironolactone, low-dose hydralazine, when deemed safe per neurology in the setting of acute CVA  Signed: Isaias Cowman MD,PhD, [facility] 01/19/2019, 4:51 PM

## 2019-01-19 NOTE — Progress Notes (Addendum)
ANTICOAGULATION CONSULT NOTE - Initial Consult  Pharmacy Consult for heparin  Indication: Large LV thrombus, acute stroke, no bolus, goal 0.3-0.5  Allergies  Allergen Reactions  . Penicillins Hives, Rash and Other (See Comments)    Did it involve swelling of the face/tongue/throat, SOB, or low BP? No Did it involve sudden or severe rash/hives, skin peeling, or any reaction on the inside of your mouth or nose? Yes Did you need to seek medical attention at a hospital or doctor's office? Yes When did it last happen? >25 years If all above answers are "NO", may proceed with cephalosporin use.   . Ace Inhibitors Nausea And Vomiting    Patient Measurements: Height: 5\' 6"  (167.6 cm) Weight: 221 lb (100.2 kg) IBW/kg (Calculated) : 59.3 Heparin Dosing Weight: 82 kg  Vital Signs: Temp: 97.7 F (36.5 C) (01/11 1607) Temp Source: Oral (01/11 1607) BP: 126/99 (01/11 1607) Pulse Rate: 75 (01/11 1607)  Labs: Recent Labs    01/17/19 2006 01/17/19 2325 01/18/19 0530  HGB 13.4  --   --   HCT 42.9  --   --   PLT 334  --   --   APTT  --  28  --   LABPROT  --  12.6  --   INR  --  1.0  --   CREATININE 2.41*  --   --   TROPONINIHS  --   --  37*    Estimated Creatinine Clearance: 28.6 mL/min (A) (by C-G formula based on SCr of 2.41 mg/dL (H)).  Assessment: 64 yo female here with acute stroke starting on heparin drip for large, fixed thrombus on the lateral wall of the left ventricle per ECHO. Heparin bridge to warfarin. Of note pt received SQ heparin at 1433; per neurologist, ok to start heparin drip now. Per neurologist, no heparin bolus, goal 0.3-0.5.  1/9 INR 1.0, aPTT 28  Goal of Therapy:  Heparin level 0.3-0.5 units/ml Monitor platelets by anticoagulation protocol: Yes   Plan:  CBC now since it's been about 48h  No heparin bolus Start heparin at 1000 units/hr HL 8h after start of drip, CBC in AM Informed RN of plan  Pharmacy will continue to follow.   Katelyn Lane 01/19/2019,4:14 PM

## 2019-01-20 LAB — CBC
HCT: 38.5 % (ref 36.0–46.0)
Hemoglobin: 11.8 g/dL — ABNORMAL LOW (ref 12.0–15.0)
MCH: 26 pg (ref 26.0–34.0)
MCHC: 30.6 g/dL (ref 30.0–36.0)
MCV: 85 fL (ref 80.0–100.0)
Platelets: 268 10*3/uL (ref 150–400)
RBC: 4.53 MIL/uL (ref 3.87–5.11)
RDW: 15.2 % (ref 11.5–15.5)
WBC: 4.6 10*3/uL (ref 4.0–10.5)
nRBC: 0 % (ref 0.0–0.2)

## 2019-01-20 LAB — BASIC METABOLIC PANEL
Anion gap: 8 (ref 5–15)
BUN: 41 mg/dL — ABNORMAL HIGH (ref 8–23)
CO2: 22 mmol/L (ref 22–32)
Calcium: 9.1 mg/dL (ref 8.9–10.3)
Chloride: 108 mmol/L (ref 98–111)
Creatinine, Ser: 1.96 mg/dL — ABNORMAL HIGH (ref 0.44–1.00)
GFR calc Af Amer: 31 mL/min — ABNORMAL LOW (ref >60)
GFR calc non Af Amer: 27 mL/min — ABNORMAL LOW (ref >60)
Glucose, Bld: 163 mg/dL — ABNORMAL HIGH (ref 70–99)
Potassium: 4.1 mmol/L (ref 3.5–5.1)
Sodium: 138 mmol/L (ref 135–145)

## 2019-01-20 LAB — HEPARIN LEVEL (UNFRACTIONATED)
Heparin Unfractionated: 0.54 IU/mL (ref 0.30–0.70)
Heparin Unfractionated: 0.47 IU/mL (ref 0.30–0.70)

## 2019-01-20 LAB — PROTIME-INR
INR: 1 (ref 0.8–1.2)
Prothrombin Time: 13.2 seconds (ref 11.4–15.2)

## 2019-01-20 MED ORDER — SIMETHICONE 80 MG PO CHEW
80.0000 mg | CHEWABLE_TABLET | Freq: Four times a day (QID) | ORAL | Status: DC | PRN
  Administered 2019-01-21: 07:00:00 80 mg via ORAL
  Filled 2019-01-20 (×2): qty 1

## 2019-01-20 MED ORDER — APIXABAN 5 MG PO TABS
10.0000 mg | ORAL_TABLET | Freq: Two times a day (BID) | ORAL | Status: DC
  Administered 2019-01-20 – 2019-01-21 (×3): 10 mg via ORAL
  Filled 2019-01-20 (×3): qty 2

## 2019-01-20 MED ORDER — SIMETHICONE 80 MG PO CHEW
80.0000 mg | CHEWABLE_TABLET | Freq: Once | ORAL | Status: AC
  Administered 2019-01-20: 80 mg via ORAL
  Filled 2019-01-20: qty 1

## 2019-01-20 MED ORDER — APIXABAN 5 MG PO TABS: 5.0000 mg | ORAL_TABLET | Freq: Two times a day (BID) | ORAL | Status: DC

## 2019-01-20 MED ORDER — WARFARIN SODIUM 7.5 MG PO TABS
7.5000 mg | ORAL_TABLET | Freq: Once | ORAL | Status: DC
  Filled 2019-01-20: qty 1

## 2019-01-20 NOTE — Progress Notes (Signed)
Pt up and about in room  And in hallway. tol well. Chair at times. Heparin drip stopped  earlierand pt started on eliquis

## 2019-01-20 NOTE — Progress Notes (Signed)
[facility] Cardiology    SUBJECTIVE: The patient denies chest pain or shortness of breath. She reports feeling agitated from having just moved from another room and having to get blood work this morning, but states that she is calming down now.   Vitals:   01/19/19 1214 01/19/19 1607 01/19/19 2030 01/20/19 0500  BP: 123/79 (!) 126/99 (!) 127/94 (!) 126/96  Pulse: 70 75 72 74  Resp:  18 18 18   Temp:  97.7 F (36.5 C) 97.6 F (36.4 C)   TempSrc:  Oral Oral   SpO2:  98% 99% 95%  Weight:      Height:         Intake/Output Summary (Last 24 hours) at 01/20/2019 0803 Last data filed at 01/20/2019 0400 Gross per 24 hour  Intake 736.85 ml  Output --  Net 736.85 ml      PHYSICAL EXAM  General: Well developed, well nourished, in no acute distress HEENT:  Normocephalic and atramatic Neck:  No JVD.  Lungs: normal effort of breathing on room air; slight wheezing Heart: HRRR . Normal S1 and S2 without gallops or murmurs.  Abdomen: nondistended Msk:  Back normal, normal gait. Normal strength and tone for age. Extremities: No clubbing, cyanosis or edema.   Neuro: Alert and oriented X 3. Psych:  responds appropriately   LABS: Basic Metabolic Panel: Recent Labs    01/17/19 2006  NA 142  K 3.6  CL 105  CO2 24  GLUCOSE 125*  BUN 49*  CREATININE 2.41*  CALCIUM 9.8   Liver Function Tests: No results for input(s): AST, ALT, ALKPHOS, BILITOT, PROT, ALBUMIN in the last 72 hours. No results for input(s): LIPASE, AMYLASE in the last 72 hours. CBC: Recent Labs    01/17/19 2006 01/19/19 1655  WBC 4.9 4.6  NEUTROABS 3.0  --   HGB 13.4 13.3  HCT 42.9 44.1  MCV 85.3 86.5  PLT 334 293   Cardiac Enzymes: No results for input(s): CKTOTAL, CKMB, CKMBINDEX, TROPONINI in the last 72 hours. BNP: Invalid input(s): POCBNP D-Dimer: No results for input(s): DDIMER in the last 72 hours. Hemoglobin A1C: Recent Labs    01/18/19 0530  HGBA1C 5.9*   Fasting Lipid Panel: Recent Labs     01/18/19 0530  CHOL 228*  HDL 46  LDLCALC 134*  TRIG 242*  CHOLHDL 5.0   Thyroid Function Tests: No results for input(s): TSH, T4TOTAL, T3FREE, THYROIDAB in the last 72 hours.  Invalid input(s): FREET3 Anemia Panel: No results for input(s): VITAMINB12, FOLATE, FERRITIN, TIBC, IRON, RETICCTPCT in the last 72 hours.  US Carotid Bilateral (at [facility] and [facility] only)  Result Date: 01/18/2019 CLINICAL DATA:  Stroke. EXAM: BILATERAL CAROTID DUPLEX ULTRASOUND TECHNIQUE: Pearline Cables scale imaging, color Doppler and duplex ultrasound were performed of bilateral carotid and vertebral arteries in the neck. COMPARISON:  None. FINDINGS: Criteria: Quantification of carotid stenosis is based on velocity parameters that correlate the residual internal carotid diameter with NASCET-based stenosis levels, using the diameter of the distal internal carotid lumen as the denominator for stenosis measurement. The following velocity measurements were obtained: RIGHT ICA: 53/28 cm/sec CCA: [id number] cm/sec SYSTOLIC ICA/CCA RATIO:  1.2 ECA: 52 cm/sec LEFT ICA: 45/24 cm/sec CCA: [id number] cm/sec SYSTOLIC ICA/CCA RATIO:  0.9 ECA: 47 cm/sec RIGHT CAROTID ARTERY: Echogenic plaque at the right carotid bulb. External carotid artery is patent with normal waveform. Small amount of echogenic plaque in the proximal internal carotid artery. Normal waveforms and velocities in the internal carotid artery. RIGHT VERTEBRAL ARTERY:  Antegrade flow and normal waveform in the right vertebral artery. LEFT CAROTID ARTERY: Small amount of plaque at the left carotid bulb. External carotid artery is patent with normal waveform. Minimal plaque in the proximal internal carotid artery. Normal waveforms and velocities in the internal carotid artery. LEFT VERTEBRAL ARTERY: Antegrade flow and normal waveform in the left vertebral artery. IMPRESSION: Mild atherosclerotic disease in the carotid arteries. Estimated degree of stenosis in the internal carotid arteries is less than  50% bilaterally. Patent vertebral arteries with antegrade flow. Electronically Signed   By: Markus Daft M.D.   On: 01/18/2019 11:37   ECHOCARDIOGRAM COMPLETE  Result Date: 01/19/2019   ECHOCARDIOGRAM REPORT   Patient Name:   Katelyn Lane Date of Exam: 01/19/2019 Medical Rec #:  [id number]     Height:       66.0 in Accession #:    [id number]    Weight:       221.0 lb Date of Birth:  10/04/1955    BSA:          2.09 m Patient Age:    64 years      BP:           112/80 mmHg Patient Gender: F             HR:           62 bpm. Exam Location:  [facility] Procedure: 2D Echo, Color Doppler, Cardiac Doppler and Intracardiac            Opacification Agent Indications:     I63.9 Stroke  History:         Patient has prior history of Echocardiogram examinations, most                  recent 12/29/2018. CHF; Risk Factors:Dyslipidemia.  Sonographer:     Charmayne Sheer RDCS (AE) Referring Phys:  [id number] [facility] Diagnosing Phys: Neoma Laming MD IMPRESSIONS  1. Left ventricular ejection fraction, by visual estimation, is <20%. The left ventricle has severely decreased function. There is no left ventricular hypertrophy.LVEF 6 PERCENT  2. Definity contrast agent was given IV to delineate the left ventricular endocardial borders.  3. Large, fixed thrombus on the lateral wall of the left ventricle.  4. Left ventricular diastolic parameters are consistent with Grade III diastolic dysfunction (restrictive).  5. Severely dilated left ventricular internal cavity size.  6. The left ventricle demonstrates global hypokinesis.  7. Global right ventricle has normal systolic function.The right ventricular size is severely enlarged. No increase in right ventricular wall thickness.  8. Left atrial size was severely dilated.  9. Right atrial size was severely dilated. 10. The mitral valve is normal in structure. Trivial mitral valve regurgitation. No evidence of mitral stenosis. 11. The tricuspid valve is normal in structure. 12. The aortic valve is  normal in structure. Aortic valve regurgitation is not visualized. No evidence of aortic valve sclerosis or stenosis. 13. The pulmonic valve was normal in structure. Pulmonic valve regurgitation is not visualized. 14. Moderately elevated pulmonary artery systolic pressure. 15. The inferior vena cava is normal in size with greater than 50% respiratory variability, suggesting right atrial pressure of 3 mmHg. FINDINGS  Left Ventricle: Left ventricular ejection fraction, by visual estimation, is <20%. The left ventricle has severely decreased function. Definity contrast agent was given IV to delineate the left ventricular endocardial borders. The left ventricle demonstrates global hypokinesis. The left ventricular internal cavity size was severely dilated left ventricle. There is no left  ventricular hypertrophy. Left ventricular diastolic parameters are consistent with Grade III diastolic dysfunction (restrictive). Normal left atrial pressure. There is a large, fixed, lateral left ventricular thrombus. The thrombus appears protruding and regular in shape and solid in texture. Right Ventricle: The right ventricular size is severely enlarged. No increase in right ventricular wall thickness. Global RV systolic function is has normal systolic function. The tricuspid regurgitant velocity is 2.85 m/s, and with an assumed right atrial pressure of 10 mmHg, the estimated right ventricular systolic pressure is moderately elevated at 42.5 mmHg. Left Atrium: Left atrial size was severely dilated. Right Atrium: Right atrial size was severely dilated Pericardium: There is no evidence of pericardial effusion. Mitral Valve: The mitral valve is normal in structure. Trivial mitral valve regurgitation. No evidence of mitral valve stenosis by observation. MV peak gradient, 3.9 mmHg. Tricuspid Valve: The tricuspid valve is normal in structure. Tricuspid valve regurgitation is mild. Aortic Valve: The aortic valve is normal in structure.  Aortic valve regurgitation is not visualized. The aortic valve is structurally normal, with no evidence of sclerosis or stenosis. Aortic valve mean gradient measures 4.0 mmHg. Aortic valve peak gradient measures 7.3 mmHg. Aortic valve area, by VTI measures 2.03 cm. Pulmonic Valve: The pulmonic valve was normal in structure. Pulmonic valve regurgitation is not visualized. Pulmonic regurgitation is not visualized. Aorta: The aortic root, ascending aorta and aortic arch are all structurally normal, with no evidence of dilitation or obstruction. Venous: The inferior vena cava is normal in size with greater than 50% respiratory variability, suggesting right atrial pressure of 3 mmHg. IAS/Shunts: No atrial level shunt detected by color flow Doppler. There is no evidence of a patent foramen ovale. No ventricular septal defect is seen or detected. There is no evidence of an atrial septal defect.  LEFT VENTRICLE PLAX 2D LVIDd:         6.50 cm  Diastology LVIDs:         6.28 cm  LV e' lateral:   4.90 cm/s LV PW:         1.22 cm  LV E/e' lateral: 19.9 LV IVS:        0.91 cm  LV e' medial:    4.57 cm/s LVOT diam:     2.40 cm  LV E/e' medial:  21.3 LV SV:         17 ml LV SV Index:   7.55 LVOT Area:     4.52 cm  RIGHT VENTRICLE RV Basal diam:  4.40 cm LEFT ATRIUM           Index       RIGHT ATRIUM           Index LA diam:      4.50 cm 2.16 cm/m  RA Area:     15.40 cm LA Vol (A4C): 92.0 ml 44.09 ml/m RA Volume:   38.90 ml  18.64 ml/m  AORTIC VALVE                    PULMONIC VALVE AV Area (Vmax):    2.45 cm     PV Vmax:       0.58 m/s AV Area (Vmean):   2.15 cm     PV Vmean:      40.800 cm/s AV Area (VTI):     2.03 cm     PV VTI:        0.098 m AV Vmax:           135.00  cm/s  PV Peak grad:  1.3 mmHg AV Vmean:          101.000 cm/s PV Mean grad:  1.0 mmHg AV VTI:            0.252 m AV Peak Grad:      7.3 mmHg AV Mean Grad:      4.0 mmHg LVOT Vmax:         73.20 cm/s LVOT Vmean:        47.900 cm/s LVOT VTI:          0.113  m LVOT/AV VTI ratio: 0.45  AORTA Ao Root diam: 3.20 cm MITRAL VALVE                        TRICUSPID VALVE MV Area (PHT): 4.06 cm             TR Peak grad:   32.5 mmHg MV Peak grad:  3.9 mmHg             TR Vmax:        285.00 cm/s MV Mean grad:  1.0 mmHg MV Vmax:       0.99 m/s             SHUNTS MV Vmean:      48.8 cm/s            Systemic VTI:  0.11 m MV VTI:        0.21 m               Systemic Diam: 2.40 cm MV PHT:        54.23 msec MV Decel Time: 187 msec MV E velocity: 97.50 cm/s 103 cm/s MV A velocity: 80.00 cm/s 70.3 cm/s MV E/A ratio:  1.22       1.5  Neoma Laming MD Electronically signed by Neoma Laming MD Signature Date/Time: 01/19/2019/2:46:46 PM    Final      Echo: Severe dilated cardiomyopathy, with LVEF less than 20%, with left ventricular thrombus    ASSESSMENT AND PLAN:  Principal Problem:   Stroke [facility]) Active Problems:   HFrEF (heart failure with reduced ejection fraction) (HCC)   Dilated cardiomyopathy ([place])   Acute kidney injury superimposed on CKD (HCC)   Dyslipidemia   Depression    1. Acute CVA with brain MRI revealing small cortical/subcortical acute infarct. Carotid with mild stenosis bilaterally. Etiology likely cardioembolic. 2D echocardiogram revealed severely dilated cardiomyopathy with large LV thrombus. Patient started on heparin drip to bridge to warfarin.  2. Left ventricular thrombus, currently on heparin drip to bridge to warfarin 3. Dilated cardiomyopathy/chronic systolic congestive heart failure, LVEF less than 20%. Heart failure medications currently on hold for permissive hypertension in setting of acute CVA 4. Insignificant coronary artery disease per recent cardiac cath 5. Polysubstance abuse  Recommendations: 1. Agree with current therapy 2. Agree with heparin drip to bridge to warfarin with goal INR 2.0-3.0 3. Resume carvedilol, isosorbide mononitrate, spironolactone, low-dose hydralazine, when deemed appropriate per neurology in the setting  of acute CVA 4. Will plan to have patient follow-up with Dr. Nehemiah Massed 1 week from discharge   Clabe Seal, PA-C 01/20/2019 8:03 AM

## 2019-01-20 NOTE — Progress Notes (Signed)
ANTICOAGULATION CONSULT NOTE - Initial Consult  Pharmacy Consult for heparin  Indication: Large LV thrombus, acute stroke, no bolus, goal 0.3-0.5  Allergies  Allergen Reactions  . Penicillins Hives, Rash and Other (See Comments)    Did it involve swelling of the face/tongue/throat, SOB, or low BP? No Did it involve sudden or severe rash/hives, skin peeling, or any reaction on the inside of your mouth or nose? Yes Did you need to seek medical attention at a hospital or doctor's office? Yes When did it last happen? >25 years If all above answers are "NO", may proceed with cephalosporin use.   . Ace Inhibitors Nausea And Vomiting    Patient Measurements: Height: 5\' 6"  (167.6 cm) Weight: 221 lb (100.2 kg) IBW/kg (Calculated) : 59.3 Heparin Dosing Weight: 82 kg  Vital Signs: Temp: 97.6 F (36.4 C) (01/11 2030) Temp Source: Oral (01/11 2030) BP: 127/94 (01/11 2030) Pulse Rate: 72 (01/11 2030)  Labs: Recent Labs    01/17/19 2006 01/17/19 2325 01/18/19 0530 01/19/19 1655 01/20/19 0048  HGB 13.4  --   --  13.3  --   HCT 42.9  --   --  44.1  --   PLT 334  --   --  293  --   APTT  --  28  --   --   --   LABPROT  --  12.6  --   --   --   INR  --  1.0  --   --   --   HEPARINUNFRC  --   --   --   --  0.54  CREATININE 2.41*  --   --   --   --   TROPONINIHS  --   --  37*  --   --     Estimated Creatinine Clearance: 28.6 mL/min (A) (by C-G formula based on SCr of 2.41 mg/dL (H)).  Assessment: 64 yo female here with acute stroke starting on heparin drip for large, fixed thrombus on the lateral wall of the left ventricle per ECHO. Heparin bridge to warfarin. Of note pt received SQ heparin at 1433; per neurologist, ok to start heparin drip now. Per neurologist, no heparin bolus, goal 0.3-0.5.  1/9 INR 1.0, aPTT 28  Goal of Therapy:  Heparin level 0.3-0.5 units/ml Monitor platelets by anticoagulation protocol: Yes   Plan:  01/12 @ 0100 HL 0.54 slightly above goal. Will  reduce rate to 950 units/hr and will recheck HL @ 0700, CBC stable will continue to monitor.  Tobie Lords, PharmD, BCPS Clinical Pharmacist 01/20/2019,2:34 AM

## 2019-01-20 NOTE — Progress Notes (Signed)
[place] [facility] [place] NAME: Katelyn Lane    MR#:  [id number]  DATE OF BIRTH:  [birth year]  SUBJECTIVE:  CHIEF COMPLAINT:   Chief Complaint  Patient presents with  . Code Stroke  anxious, agreeable to stay.  Refused blood work earlier in the morning REVIEW OF SYSTEMS:  Review of Systems  Constitutional: Negative for diaphoresis, fever, malaise/fatigue and weight loss.  HENT: Negative for ear discharge, ear pain, hearing loss, nosebleeds, sore throat and tinnitus.   Eyes: Negative for blurred vision and pain.  Respiratory: Negative for cough, hemoptysis, shortness of breath and wheezing.   Cardiovascular: Negative for chest pain, palpitations, orthopnea and leg swelling.  Gastrointestinal: Negative for abdominal pain, blood in stool, constipation, diarrhea, heartburn, nausea and vomiting.  Genitourinary: Negative for dysuria, frequency and urgency.  Musculoskeletal: Negative for back pain and myalgias.  Skin: Negative for itching and rash.  Neurological: Negative for dizziness, tingling, tremors, focal weakness, seizures, weakness and headaches.  Psychiatric/Behavioral: Negative for depression. The patient is not nervous/anxious.    DRUG ALLERGIES:   Allergies  Allergen Reactions  . Penicillins Hives, Rash and Other (See Comments)    Did it involve swelling of the face/tongue/throat, SOB, or low BP? No Did it involve sudden or severe rash/hives, skin peeling, or any reaction on the inside of your mouth or nose? Yes Did you need to seek medical attention at a hospital or doctor's office? Yes When did it last happen? >25 years If all above answers are "NO", may proceed with cephalosporin use.   . Ace Inhibitors Nausea And Vomiting   VITALS:  Blood pressure (!) 139/58, pulse 74, temperature (!) 97.5 F (36.4 C), temperature source Oral, resp. rate 17, height 5\' 6"  (1.676 m), weight 100.2 kg, SpO2 99 %. PHYSICAL EXAMINATION:  Physical Exam HENT:      Head: Normocephalic and atraumatic.  Eyes:     Conjunctiva/sclera: Conjunctivae normal.     Pupils: Pupils are equal, round, and reactive to light.  Neck:     Thyroid: No thyromegaly.     Trachea: No tracheal deviation.  Cardiovascular:     Rate and Rhythm: Normal rate and regular rhythm.     Heart sounds: Normal heart sounds.  Pulmonary:     Effort: Pulmonary effort is normal. No respiratory distress.     Breath sounds: Normal breath sounds. No wheezing.  Chest:     Chest wall: No tenderness.  Abdominal:     General: Bowel sounds are normal. There is no distension.     Palpations: Abdomen is soft.     Tenderness: There is no abdominal tenderness.  Musculoskeletal:        General: Normal range of motion.     Cervical back: Normal range of motion and neck supple.  Skin:    General: Skin is warm and dry.     Findings: No rash.  Neurological:     Mental Status: She is alert and oriented to person, place, and time.     Cranial Nerves: No cranial nerve deficit.    LABORATORY PANEL:  Female CBC Recent Labs  Lab 01/20/19 1019  WBC 4.6  HGB 11.8*  HCT 38.5  PLT 268   ------------------------------------------------------------------------------------------------------------------ Chemistries  Recent Labs  Lab 01/20/19 1019  NA 138  K 4.1  CL 108  CO2 22  GLUCOSE 163*  BUN 41*  CREATININE 1.96*  CALCIUM 9.1   RADIOLOGY:  No results found. ASSESSMENT AND PLAN:  Katelyn Lane  is a 64 y.o. female with medical history significant for HFrEF/dilated cardiomyopathy with EF 15%, hypertension, hyperlipidemia, CKD stage III, depression, and substance use (tobacco/cocaine) who is admitted with acute stroke.  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  now back to baseline  MRI brain shows small cortical/subcortical acute infarct with involvement of left primary sensorimotor  cortex.  arrived outside tPA window on 01/17/19.  - Carotid doppler showed with mild atehrosclerosis <50 stenosis b/l.  - 2 D echo shows EF less than 20% and large left ventricle thrombus seen.  - Neuro & cardio in agreement for Eliquis considering compliance issue. She is refusing blood work at times which may make coumadin mgmt difficult. - UDS: cocaine +ve  AKI on CKD stage III: Close to baseline. Holding Diuretics for now. Can resume at D/C  HFrEF/dilated cardiomyopathy: Echocardiogram 12/29/2018 showed EF [id number], grade 3 diastolic dysfunction, global LV hypokinesis.  Right/left heart cath 12/31/2018 showed severe dilated cardiomyopathy with EF 15% and minimal atherosclerosis of left circumflex and right coronary.  Appears euvolemic and well compensated. -Holding home Coreg, spironolactone, and Lasix to allow for permissive hypertension in setting of acute CVA - cardio ok to resume these meds if ok by Neuro -Monitor strict I/O's and daily weights  Hypertension: Holding Coreg, spironolactone, Lasix as above.  Hyperlipidemia:  atorvastatin 40 mg daily.  Depression: Continue fluoxetine.  Tobacco use: Patient counseled on smoking cessation.  Substance use: Patient counseled on cessation from cocaine use  Can likely be D/C home tomorrow on eliquis. outpt neuro and cardio f/up   All the records are reviewed and case discussed with Care Management/Social Worker. Management plans discussed with the patient, Neuro, cardio and they are in agreement.  CODE STATUS: DNR  TOTAL TIME TAKING CARE OF THIS PATIENT: 35 minutes.   More than 50% of the time was spent in counseling/coordination of care: YES  POSSIBLE D/C IN 1 DAYS, DEPENDING ON CLINICAL CONDITION. And therapeutic INR   Max Sane M.D on 01/20/2019 at 4:20 PM  Between 7am to 6pm - Pager - [phone]  After 6pm go to [email or web address] - password [email or web address]  [facility]   CC: Primary care physician; Revelo, Elyse Jarvis, MD  Note: This dictation was prepared with Dragon dictation along with smaller phrase technology. Any transcriptional errors that result from this process are unintentional.

## 2019-01-20 NOTE — Progress Notes (Signed)
Patient has continued improvement. Now with small patch of numbness under right eye.   IM discussing option of using novel anticoagulant with cardiology given patient's resistance/refusal for blood draws.   At this point, the etiology of her stroke is the cardiac thrombus and treatment is anticoagulation.   Please call with any further questions or concerns.   Roland Rack, MD [facility] [phone]  If 7pm- 7am, please page neurology on call as listed in [place].

## 2019-01-20 NOTE — Consult Note (Signed)
ANTICOAGULATION CONSULT NOTE - Initial Consult  Pharmacy Consult for Eliquis Indication: VTE treatment  Allergies  Allergen Reactions  . Penicillins Hives, Rash and Other (See Comments)    Did it involve swelling of the face/tongue/throat, SOB, or low BP? No Did it involve sudden or severe rash/hives, skin peeling, or any reaction on the inside of your mouth or nose? Yes Did you need to seek medical attention at a hospital or doctor's office? Yes When did it last happen? >25 years If all above answers are "NO", may proceed with cephalosporin use.   . Ace Inhibitors Nausea And Vomiting    Patient Measurements: Height: 5\' 6"  (167.6 cm) Weight: 221 lb (100.2 kg) IBW/kg (Calculated) : 59.3  Vital Signs: Temp: 99.2 F (37.3 C) (01/12 0824) Temp Source: Oral (01/12 0824) BP: 108/88 (01/12 1045) Pulse Rate: 80 (01/12 1045)  Labs: Recent Labs    01/17/19 2006 01/17/19 2325 01/18/19 0530 01/19/19 1655 01/20/19 0048 01/20/19 1019  HGB 13.4  --   --  13.3  --  11.8*  HCT 42.9  --   --  44.1  --  38.5  PLT 334  --   --  293  --  268  APTT  --  28  --   --   --   --   LABPROT  --  12.6  --   --   --  13.2  INR  --  1.0  --   --   --  1.0  HEPARINUNFRC  --   --   --   --  0.54 0.47  CREATININE 2.41*  --   --   --   --  1.96*  TROPONINIHS  --   --  37*  --   --   --     Estimated Creatinine Clearance: 35.1 mL/min (A) (by C-G formula based on SCr of 1.96 mg/dL (H)).   Medical History: Past Medical History:  Diagnosis Date  . CHF (congestive heart failure) ([place])   . Hyperlipidemia   . Renal disorder     Medications:  No PTA anticoagulation  Assessment: 64 yo female here with acute stroke originally started on heparin drip for large, fixed thrombus on the lateral wall of the left ventricle per ECHO. Heparin bridge to warfarin - pt received 1 x 7.5mg  dose of warfarin evening 1/13.  Pharmacy has been consulted to change therapy to Eliquis per concern for pt  non-compliance and refusal of f/u labs.  Goal of Therapy:  Monitor platelets by anticoagulation protocol: Yes   Plan:  Will d/c heparin drip with first dose of Eliquis.  Due to nature of thrombus, will dose treatment dose of Eliquis 10mg  bid x 7 days, followed by Eliquis 5mg  bid  Lu Duffel, PharmD, BCPS Clinical Pharmacist 01/20/2019 12:24 PM

## 2019-01-20 NOTE — Progress Notes (Signed)
ANTICOAGULATION CONSULT NOTE - Initial Consult  Pharmacy Consult for heparin  Indication: Large LV thrombus, acute stroke, no bolus, goal 0.3-0.5  Allergies  Allergen Reactions  . Penicillins Hives, Rash and Other (See Comments)    Did it involve swelling of the face/tongue/throat, SOB, or low BP? No Did it involve sudden or severe rash/hives, skin peeling, or any reaction on the inside of your mouth or nose? Yes Did you need to seek medical attention at a hospital or doctor's office? Yes When did it last happen? >25 years If all above answers are "NO", may proceed with cephalosporin use.   . Ace Inhibitors Nausea And Vomiting    Patient Measurements: Height: 5\' 6"  (167.6 cm) Weight: 221 lb (100.2 kg) IBW/kg (Calculated) : 59.3 Heparin Dosing Weight: 82 kg  Vital Signs: Temp: 99.2 F (37.3 C) (01/12 0824) Temp Source: Oral (01/12 0824) BP: 136/102 (01/12 0824) Pulse Rate: 73 (01/12 0824)  Labs: Recent Labs    01/17/19 2006 01/17/19 2325 01/18/19 0530 01/19/19 1655 01/20/19 0048 01/20/19 1019  HGB 13.4  --   --  13.3  --  11.8*  HCT 42.9  --   --  44.1  --  38.5  PLT 334  --   --  293  --  268  APTT  --  28  --   --   --   --   LABPROT  --  12.6  --   --   --  13.2  INR  --  1.0  --   --   --  1.0  HEPARINUNFRC  --   --   --   --  0.54 0.47  CREATININE 2.41*  --   --   --   --  1.96*  TROPONINIHS  --   --  37*  --   --   --     Estimated Creatinine Clearance: 28.6 mL/min (A) (by C-G formula based on SCr of 2.41 mg/dL (H)).  Assessment: 64 yo female here with acute stroke starting on heparin drip for large, fixed thrombus on the lateral wall of the left ventricle per ECHO. Heparin bridge to warfarin. Of note pt received SQ heparin at 1433; per neurologist, ok to start heparin drip now. Per neurologist, no heparin bolus, goal 0.3-0.5.  1/09 Baselines INR 1.0, aPTT 28  Heparin Course: 1/12 0100 HL 0.54 - rate decreased to 950 units/hr 1/12 0700 HL (pt  refused lab-rescheduled for 10am) 1/12 1019 HL 0.47 - continue current rate  Warfarin course                   INR   Warfarin Dose  1/11                                   7.5mg  1/12 1019   1.0                  7.5mg   Goal of Therapy:  Heparin level 0.3-0.5 units/ml Monitor platelets by anticoagulation protocol: Yes   Plan:  Will dose 7.5mg  warfarin again this evening  Patient HL is within goal range - will continue current rate and recheck in 6 hours per protocol (unless pt refuses)  CBC stable will continue to monitor daily while on heparin drip.  Lu Duffel, PharmD, BCPS Clinical Pharmacist 01/20/2019 8:32 AM

## 2019-01-21 LAB — BASIC METABOLIC PANEL
Anion gap: 6 (ref 5–15)
BUN: 38 mg/dL — ABNORMAL HIGH (ref 8–23)
CO2: 23 mmol/L (ref 22–32)
Calcium: 9.4 mg/dL (ref 8.9–10.3)
Chloride: 110 mmol/L (ref 98–111)
Creatinine, Ser: 1.77 mg/dL — ABNORMAL HIGH (ref 0.44–1.00)
GFR calc Af Amer: 35 mL/min — ABNORMAL LOW (ref >60)
GFR calc non Af Amer: 30 mL/min — ABNORMAL LOW (ref >60)
Glucose, Bld: 111 mg/dL — ABNORMAL HIGH (ref 70–99)
Potassium: 4.3 mmol/L (ref 3.5–5.1)
Sodium: 139 mmol/L (ref 135–145)

## 2019-01-21 LAB — CBC
HCT: 38.9 % (ref 36.0–46.0)
Hemoglobin: 11.7 g/dL — ABNORMAL LOW (ref 12.0–15.0)
MCH: 25.8 pg — ABNORMAL LOW (ref 26.0–34.0)
MCHC: 30.1 g/dL (ref 30.0–36.0)
MCV: 85.9 fL (ref 80.0–100.0)
Platelets: 270 10*3/uL (ref 150–400)
RBC: 4.53 MIL/uL (ref 3.87–5.11)
RDW: 15.2 % (ref 11.5–15.5)
WBC: 4.2 10*3/uL (ref 4.0–10.5)
nRBC: 0 % (ref 0.0–0.2)

## 2019-01-21 MED ORDER — ELIQUIS DVT/PE STARTER PACK 5 MG PO TBPK: ORAL_TABLET | ORAL | 0 refills | Status: DC

## 2019-01-21 MED ORDER — FUROSEMIDE 40 MG PO TABS: 40.0000 mg | ORAL_TABLET | Freq: Every day | ORAL | 0 refills | Status: DC

## 2019-01-21 NOTE — Progress Notes (Signed)
[facility]    SUBJECTIVE: The patient reports overall feeling well. She did have abdominal pain last night, which has since resolved. She currently denies chest pain or shortness of breath.   Vitals:   01/20/19 2351 01/20/19 2355 01/21/19 0453 01/21/19 0721  BP: (!) 101/91 (!) 136/109 (!) 122/92 (!) 119/91  Pulse: 66 79 77 76  Resp: 16 16 16 17   Temp: (!) 97.4 F (36.3 C) 97.6 F (36.4 C) (!) 97.5 F (36.4 C) 97.8 F (36.6 C)  TempSrc: Oral Oral Oral Oral  SpO2: 98% 98% 97% 98%  Weight:      Height:         Intake/Output Summary (Last 24 hours) at 01/21/2019 1033 Last data filed at 01/20/2019 1700 Gross per 24 hour  Intake 761 ml  Output --  Net 761 ml      PHYSICAL EXAM  General: Well developed, well nourished, in no acute distress HEENT:  Normocephalic and atramatic Neck:  No JVD.  Lungs: Clear bilaterally to auscultation, normal effort of breathing on room air. Heart: HRRR . Normal S1 and S2 without gallops or murmurs.  Msk: Normal strength and tone for age. Extremities: No clubbing, cyanosis or edema.   Neuro: Alert and oriented X 3. Psych:  Good affect, responds appropriately   LABS: Basic Metabolic Panel: Recent Labs    01/20/19 1019 01/21/19 0658  NA 138 139  K 4.1 4.3  CL 108 110  CO2 22 23  GLUCOSE 163* 111*  BUN 41* 38*  CREATININE 1.96* 1.77*  CALCIUM 9.1 9.4   Liver Function Tests: No results for input(s): AST, ALT, ALKPHOS, BILITOT, PROT, ALBUMIN in the last 72 hours. No results for input(s): LIPASE, AMYLASE in the last 72 hours. CBC: Recent Labs    01/20/19 1019 01/21/19 0658  WBC 4.6 4.2  HGB 11.8* 11.7*  HCT 38.5 38.9  MCV 85.0 85.9  PLT 268 270   Cardiac Enzymes: No results for input(s): CKTOTAL, CKMB, CKMBINDEX, TROPONINI in the last 72 hours. BNP: Invalid input(s): POCBNP D-Dimer: No results for input(s): DDIMER in the last 72 hours. Hemoglobin A1C: No results for input(s): HGBA1C in the last 72 hours. Fasting  Lipid Panel: No results for input(s): CHOL, HDL, LDLCALC, TRIG, CHOLHDL, LDLDIRECT in the last 72 hours. Thyroid Function Tests: No results for input(s): TSH, T4TOTAL, T3FREE, THYROIDAB in the last 72 hours.  Invalid input(s): FREET3 Anemia Panel: No results for input(s): VITAMINB12, FOLATE, FERRITIN, TIBC, IRON, RETICCTPCT in the last 72 hours.  ECHOCARDIOGRAM COMPLETE  Result Date: 01/19/2019   ECHOCARDIOGRAM REPORT   Patient Name:   Katelyn Lane Date of Exam: 01/19/2019 Medical Rec #:  [id number]     Height:       66.0 in Accession #:    [id number]    Weight:       221.0 lb Date of Birth:  Sep 15, 1955    BSA:          2.09 m Patient Age:    64 years      BP:           112/80 mmHg Patient Gender: F             HR:           62 bpm. Exam Location:  [facility] Procedure: 2D Echo, Color Doppler, Cardiac Doppler and Intracardiac            Opacification Agent Indications:     I63.9 Stroke  History:  Patient has prior history of Echocardiogram examinations, most                  recent 12/29/2018. CHF; Risk Factors:Dyslipidemia.  Sonographer:     Charmayne Sheer RDCS (AE) Referring Phys:  [id number] [place] Diagnosing Phys: Neoma Laming MD IMPRESSIONS  1. Left ventricular ejection fraction, by visual estimation, is <20%. The left ventricle has severely decreased function. There is no left ventricular hypertrophy.LVEF 6 PERCENT  2. Definity contrast agent was given IV to delineate the left ventricular endocardial borders.  3. Large, fixed thrombus on the lateral wall of the left ventricle.  4. Left ventricular diastolic parameters are consistent with Grade III diastolic dysfunction (restrictive).  5. Severely dilated left ventricular internal cavity size.  6. The left ventricle demonstrates global hypokinesis.  7. Global right ventricle has normal systolic function.The right ventricular size is severely enlarged. No increase in right ventricular wall thickness.  8. Left atrial size was severely dilated.  9.  Right atrial size was severely dilated. 10. The mitral valve is normal in structure. Trivial mitral valve regurgitation. No evidence of mitral stenosis. 11. The tricuspid valve is normal in structure. 12. The aortic valve is normal in structure. Aortic valve regurgitation is not visualized. No evidence of aortic valve sclerosis or stenosis. 13. The pulmonic valve was normal in structure. Pulmonic valve regurgitation is not visualized. 14. Moderately elevated pulmonary artery systolic pressure. 15. The inferior vena cava is normal in size with greater than 50% respiratory variability, suggesting right atrial pressure of 3 mmHg. FINDINGS  Left Ventricle: Left ventricular ejection fraction, by visual estimation, is <20%. The left ventricle has severely decreased function. Definity contrast agent was given IV to delineate the left ventricular endocardial borders. The left ventricle demonstrates global hypokinesis. The left ventricular internal cavity size was severely dilated left ventricle. There is no left ventricular hypertrophy. Left ventricular diastolic parameters are consistent with Grade III diastolic dysfunction (restrictive). Normal left atrial pressure. There is a large, fixed, lateral left ventricular thrombus. The thrombus appears protruding and regular in shape and solid in texture. Right Ventricle: The right ventricular size is severely enlarged. No increase in right ventricular wall thickness. Global RV systolic function is has normal systolic function. The tricuspid regurgitant velocity is 2.85 m/s, and with an assumed right atrial pressure of 10 mmHg, the estimated right ventricular systolic pressure is moderately elevated at 42.5 mmHg. Left Atrium: Left atrial size was severely dilated. Right Atrium: Right atrial size was severely dilated Pericardium: There is no evidence of pericardial effusion. Mitral Valve: The mitral valve is normal in structure. Trivial mitral valve regurgitation. No evidence of  mitral valve stenosis by observation. MV peak gradient, 3.9 mmHg. Tricuspid Valve: The tricuspid valve is normal in structure. Tricuspid valve regurgitation is mild. Aortic Valve: The aortic valve is normal in structure. Aortic valve regurgitation is not visualized. The aortic valve is structurally normal, with no evidence of sclerosis or stenosis. Aortic valve mean gradient measures 4.0 mmHg. Aortic valve peak gradient measures 7.3 mmHg. Aortic valve area, by VTI measures 2.03 cm. Pulmonic Valve: The pulmonic valve was normal in structure. Pulmonic valve regurgitation is not visualized. Pulmonic regurgitation is not visualized. Aorta: The aortic root, ascending aorta and aortic arch are all structurally normal, with no evidence of dilitation or obstruction. Venous: The inferior vena cava is normal in size with greater than 50% respiratory variability, suggesting right atrial pressure of 3 mmHg. IAS/Shunts: No atrial level shunt detected by color flow  Doppler. There is no evidence of a patent foramen ovale. No ventricular septal defect is seen or detected. There is no evidence of an atrial septal defect.  LEFT VENTRICLE PLAX 2D LVIDd:         6.50 cm  Diastology LVIDs:         6.28 cm  LV e' lateral:   4.90 cm/s LV PW:         1.22 cm  LV E/e' lateral: 19.9 LV IVS:        0.91 cm  LV e' medial:    4.57 cm/s LVOT diam:     2.40 cm  LV E/e' medial:  21.3 LV SV:         17 ml LV SV Index:   7.55 LVOT Area:     4.52 cm  RIGHT VENTRICLE RV Basal diam:  4.40 cm LEFT ATRIUM           Index       RIGHT ATRIUM           Index LA diam:      4.50 cm 2.16 cm/m  RA Area:     15.40 cm LA Vol (A4C): 92.0 ml 44.09 ml/m RA Volume:   38.90 ml  18.64 ml/m  AORTIC VALVE                    PULMONIC VALVE AV Area (Vmax):    2.45 cm     PV Vmax:       0.58 m/s AV Area (Vmean):   2.15 cm     PV Vmean:      40.800 cm/s AV Area (VTI):     2.03 cm     PV VTI:        0.098 m AV Vmax:           135.00 cm/s  PV Peak grad:  1.3 mmHg AV  Vmean:          101.000 cm/s PV Mean grad:  1.0 mmHg AV VTI:            0.252 m AV Peak Grad:      7.3 mmHg AV Mean Grad:      4.0 mmHg LVOT Vmax:         73.20 cm/s LVOT Vmean:        47.900 cm/s LVOT VTI:          0.113 m LVOT/AV VTI ratio: 0.45  AORTA Ao Root diam: 3.20 cm MITRAL VALVE                        TRICUSPID VALVE MV Area (PHT): 4.06 cm             TR Peak grad:   32.5 mmHg MV Peak grad:  3.9 mmHg             TR Vmax:        285.00 cm/s MV Mean grad:  1.0 mmHg MV Vmax:       0.99 m/s             SHUNTS MV Vmean:      48.8 cm/s            Systemic VTI:  0.11 m MV VTI:        0.21 m               Systemic Diam: 2.40 cm MV PHT:  54.23 msec MV Decel Time: 187 msec MV E velocity: 97.50 cm/s 103 cm/s MV A velocity: 80.00 cm/s 70.3 cm/s MV E/A ratio:  1.22       1.5  Neoma Laming MD Electronically signed by Neoma Laming MD Signature Date/Time: 01/19/2019/2:46:46 PM    Final     Echo: Severe dilated cardiomyopathy, with LVEF less than 20%, with left ventricular thrombus   ASSESSMENT AND PLAN:  Principal Problem:   Stroke [facility]) Active Problems:   HFrEF (heart failure with reduced ejection fraction) ([place])   Dilated cardiomyopathy ([place])   Acute kidney injury superimposed on CKD ([place])   Dyslipidemia   Depression   1. Acute CVA with brain MRI revealing small cortical/subcortical acute infarct. Carotid with mild stenosis bilaterally. Etiology likely cardioembolic. 2D echocardiogram revealed severely dilated cardiomyopathy with large LV thrombus. Patient was on heparin drip to bridge to warfarin, but due to refusal of blood draws and possible medication noncompliance, it was decided to start Eliquis. 2. Left ventricular thrombus, on Eliquis 3. Dilated cardiomyopathy/chronic systolic congestive heart failure, LVEF less than 20%. Heart failure medications currently on hold for permissive hypertension in setting of acute CVA 4. Insignificant coronary artery disease per recent cardiac cath 5.  Polysubstance abuse  Recommendations: 1. Agree with Eliquis for management of LV thrombus 2. Resume carvedilol, isosorbide mononitrate, spironolactone, low-dose hydralazine, when deemed appropriate per neurology in the setting of acute CVA 3. Follow-up with Dr. Nehemiah Massed in 1 week from discharge. I will place follow-up request. 4. Sign off for now; please call with any questions.   Clabe Seal, PA-C 01/21/2019 10:33 AM

## 2019-01-21 NOTE — Clinical Social Work Note (Signed)
Patient left before CSW could provide 30-day free card for Eliquis. Also tried to find a home health nurse. [facility], [facility], [facility], [facility], and [facility] do not have nurses available.  Dayton Scrape, [place]

## 2019-01-21 NOTE — Plan of Care (Signed)
Pt c/o abdominal pain like she did last night - states it happens when she lies down. Put in order for maalox to see if that helps.  In light of d/c tomorrow, educated pt on stopping drug use since that is linked to heart failure which has led to stroke.  Also stressed importance of calling 911 if she has stroke-like symptoms in future.  She seems to be compliant in seeing doctors and showed me information on elequis and told me how she's been directed to take it.

## 2019-01-24 NOTE — Discharge Summary (Signed)
[facility] Discharge Summary   Patient: Katelyn Lane [id number]  PCP: [name], MD  Date of admission: 01/17/2019   Date of discharge: 01/21/2019      Discharge Diagnoses:   Principal Problem:   Stroke [facility]) Active Problems:   HFrEF (heart failure with reduced ejection fraction) ([place])   Dilated cardiomyopathy ([place])   Acute kidney injury superimposed on CKD ([place])   Dyslipidemia   Depression   Admitted From: Home Disposition:  Home with home health  Recommendations for Outpatient Follow-up:  1. PCP: Follow-up with PCP in 1 week follow-up with cardiologist recommended, establish care with neurology and follow-up 2. Follow up LABS/TEST: None  Follow-up Information    Corey Skains, MD. Go in 1 week(s).   Specialty: Cardiology Why: Per Office: Please see your primary care for referral to Cardiology due to [facility] requirements Contact information: [address] [facility] [place] [address] [phone]        Revelo, Elyse Jarvis, MD. Schedule an appointment as soon as possible for a visit on 01/23/2019.   Specialty: Family Medicine Why: @ 10:00 am Contact information: [address] [place] [address] [phone]        Anabel Bene, MD. Schedule an appointment as soon as possible for a visit in 6 weeks.   Specialty: Neurology Why: Per Office; Need a referral from PCP for Medicaid  Contact information: [place] [facility] [place] [address] [phone]          Diet recommendation: Cardiac diet  Activity: The patient is advised to gradually reintroduce usual activities, as tolerated  Discharge Condition: stable  Code Status: DNR   History of present illness: As per the H and P dictated on admission, "Katelyn Lane is a 64 y.o. female with medical history significant for HFrEF/dilated cardiomyopathy with EF 15%, hypertension,  hyperlipidemia, CKD stage III, depression, and substance use (tobacco/cocaine) who presents to the ED for evaluation of right face numbness and dysarthria.  Patient states she was in her usual state of health until around 1830 on 01/17/2019.  She was sitting outside and when she walked to go inside to call out to her son she noticed she was only speaking unintelligible speech.  She noticed having numbness on the right side of her face as well.  She did not have any other focal weakness, lightheadedness/dizziness, chest pain, palpitations, dyspnea, nausea, vomiting, change in vision.  She called EMS and was brought to the ED for further evaluation.  She says her speech is now much improved.  While in the ED, she did develop new right facial droop.  She was recently admitted from 12/29/2018-01/01/2019 for acute CHF found to have dilated cardiomyopathy with EF of 15%.  She is not sure that she has obtained all her medications since discharge or if she has been taking them correctly.  She has been taking a low-dose aspirin 81 mg daily.  She reports to prior cocaine use with last use during the holidays.  She denies any more recent use.  She reports smoking up to 6 cigarettes/day."  Hospital Course:  Summary of her active problems in the hospital is as following. 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 now back to baseline MRI brain shows small cortical/subcortical acute infarct with involvement of left primary sensorimotor cortex.  arrived outside tPA window on 01/17/19.  - Carotid doppler showed with  mild atehrosclerosis <50 stenosis b/l.  - 2 D echo shows EF less than 20% and large left ventricle thrombus seen.  - Neuro& cardio in agreement for Eliquis considering compliance issue. She is refusing blood work at times which may make coumadin mgmt difficult. - UDS: cocaine +ve  AKI on CKD stage  III: Close to baseline.   HFrEF/dilated cardiomyopathy: Echocardiogram 12/29/2018 showed [id number], grade 3 diastolic dysfunction, global LV hypokinesis. Right/left heart cath 12/31/2018 showed severe dilated cardiomyopathy with EF 15% and minimal atherosclerosis of left circumflex and right coronary.Appears euvolemic and well compensated. -Resume home medications  Hypertension: Resuming blood pressure medications  Hyperlipidemia:  atorvastatin 40 mg daily.  Depression: Continue fluoxetine.  Tobacco use: Patient counseled on smoking cessation.  Substance use: Patient counseled on cessation from cocaine use  Patient was seen by physical therapy, who recommended Home health, which was arranged. On the day of the discharge the patient's vitals were stable, and no other acute medical condition were reported by patient. the patient was felt safe to be discharge at Home with Home health.  Consultants: Cardiology, neurology Procedures: Echocardiogram  Discharge Exam: General: Appear in mild distress, no Rash; Oral Mucosa Clear, moist. Cardiovascular: S1 and S2 Present, no Murmur, Respiratory: normal respiratory effort, Bilateral Air entry present and no Crackles, no wheezes Abdomen: Bowel Sound present, Soft and no tenderness, no hernia Extremities: no Pedal edema, no calf tenderness Neurology: alert and oriented to time, place, and person affect appropriate.  Filed Weights   01/17/19 1916 01/18/19 0508  Weight: 101 kg 100.2 kg   Vitals:   01/21/19 0453 01/21/19 0721  BP: (!) 122/92 (!) 119/91  Pulse: 77 76  Resp: 16 17  Temp: (!) 97.5 F (36.4 C) 97.8 F (36.6 C)  SpO2: 97% 98%    DISCHARGE MEDICATION: Allergies as of 01/21/2019      Reactions   Penicillins Hives, Rash, Other (See Comments)   Did it involve swelling of the face/tongue/throat, SOB, or low BP? No Did it involve sudden or severe rash/hives, skin peeling, or any reaction on the inside of your  mouth or nose? Yes Did you need to seek medical attention at a hospital or doctor's office? Yes When did it last happen? >25 years If all above answers are NO, may proceed with cephalosporin use.   Ace Inhibitors Nausea And Vomiting      Medication List    STOP taking these medications   aspirin 81 MG EC tablet   isosorbide mononitrate 30 MG 24 hr tablet Commonly known as: IMDUR     TAKE these medications   albuterol 108 (90 Base) MCG/ACT inhaler Commonly known as: VENTOLIN HFA Inhale 2 puffs into the lungs every 6 (six) hours as needed for wheezing or shortness of breath.   atorvastatin 20 MG tablet Commonly known as: LIPITOR Take 20 mg by mouth daily at 6 PM.   carvedilol 3.125 MG tablet Commonly known as: COREG Take 1 tablet (3.125 mg total) by mouth 2 (two) times daily with a meal.   Eliquis DVT/PE Starter Pack 5 MG Tbpk Generic drug: Apixaban Starter Pack Take as directed on package: start with two-5mg  tablets twice daily for 7 days. On day 8, switch to one-5mg  tablet twice daily.   FLUoxetine 40 MG capsule Commonly known as: PROZAC Take 40 mg by mouth daily.   furosemide 40 MG tablet Commonly known as: Lasix Take 1 tablet (40 mg total) by mouth daily. What changed: when to take this   loratadine  10 MG tablet Commonly known as: CLARITIN Take 1 tablet (10 mg total) by mouth daily. What changed:   when to take this  reasons to take this   nitroGLYCERIN 0.4 MG SL tablet Commonly known as: NITROSTAT Place 1 tablet (0.4 mg total) under the tongue every 5 (five) minutes as needed for chest pain.   spironolactone 25 MG tablet Commonly known as: ALDACTONE Take 0.5 tablets (12.5 mg total) by mouth daily.   traZODone 100 MG tablet Commonly known as: DESYREL Take 150 mg by mouth at bedtime as needed for sleep.      Allergies  Allergen Reactions   Penicillins Hives, Rash and Other (See Comments)    Did it involve swelling of the  face/tongue/throat, SOB, or low BP? No Did it involve sudden or severe rash/hives, skin peeling, or any reaction on the inside of your mouth or nose? Yes Did you need to seek medical attention at a hospital or doctor's office? Yes When did it last happen? >25 years If all above answers are NO, may proceed with cephalosporin use.    Ace Inhibitors Nausea And Vomiting   Discharge Instructions    Ambulatory referral to Neurology   Complete by: As directed    Diet - low sodium heart healthy   Complete by: As directed    Discharge instructions   Complete by: As directed    It is important that you read the given instructions as well as go over your medication list with RN to help you understand your care after this hospitalization.  Please follow-up with PCP in 1-2 weeks.  Please note that NO REFILLS for any discharge medications will be authorized once you are discharged, as it is imperative that you return to your primary care physician (or establish a relationship with a primary care physician if you do not have one) for your aftercare needs so that they can reassess your need for medications and monitor your lab values.  Please request your primary care physician to go over all Hospital Tests and Procedure/Radiological results at the follow up. Please get all Hospital records sent to your PCP by signing hospital release before you go home.    Do not take more than prescribed Pain, Sleep and Anxiety Medications.  You were cared for by a hospitalist during your hospital stay. If you have any questions about your discharge medications or the care you received while you were in the hospital after you are discharged, you can call the unit @UNIT @ you were admitted to and ask to speak with the hospitalist Berle Mull. Ask for Hospitalist on call if the hospitalist that took care of you is not available.   Once you are discharged, your primary care physician will handle any further  medical issues.  You Must read complete instructions/literature along with all the possible adverse reactions/side effects for all the Medicines you take and that have been prescribed to you. Take any new Medicines after you have completely understood and accept all the possible adverse reactions/side effects.  If you have smoked or chewed Tobacco in the last 2 yrs please STOP smoking STOP any Recreational drug use.  If you drink alcohol, please safely STOP the use. Do not drive, operating heavy machinery, perform activities at heights, swimming or participation in water activities or provide baby sitting services under influence.  Wear Seat belts while driving.   Increase activity slowly   Complete by: As directed       The results of  significant diagnostics from this hospitalization (including imaging, microbiology, ancillary and laboratory) are listed below for reference.    Significant Diagnostic Studies: DG Chest 2 View  Result Date: 01/15/2019 CLINICAL DATA:  Shortness of breath EXAM: CHEST - 2 VIEW COMPARISON:  Radiograph 01/13/2019 FINDINGS: Mild cardiomegaly with the appearance of left atrial enlargement given a double density sign. There is improving central congestion with mild cephalization of the vascularity and diminished interstitial opacity. No pneumothorax or visible effusion. No focal consolidation. No acute osseous or soft tissue abnormality. Degenerative changes are present in the imaged spine and shoulders. IMPRESSION: Improving features of with only mild residual edema with stable cardiomegaly. Electronically Signed   By: Lovena Le M.D.   On: 01/15/2019 03:56   MR Brain Wo Contrast (neuro protocol)  Result Date: 01/17/2019 CLINICAL DATA:  Speech abnormality EXAM: MRI HEAD WITHOUT CONTRAST TECHNIQUE: Multiplanar, multiecho pulse sequences of the brain and surrounding structures were obtained without intravenous contrast. COMPARISON:  None. FINDINGS: Brain: There is  cortical/subcortical reduced diffusion with involvement of the left precentral and postcentral gyri lateral to the hand motor region and therefore likely involving the face. Scattered small foci of T2 hyperintensity in the supratentorial white matter are nonspecific but may reflect mild chronic microvascular ischemic changes. Small chronic right cerebellar infarct. No evidence of intracranial hemorrhage. There is no intracranial mass, mass effect, or edema. There is no hydrocephalus or extra-axial fluid collection. Vascular: Major vessel flow voids at the skull base are preserved. Skull and upper cervical spine: Normal marrow signal is preserved. Sinuses/Orbits: Paranasal sinuses are aerated. Orbits are unremarkable. Other: Sella is unremarkable.  Mastoid air cells are clear. IMPRESSION: Small cortical/subcortical acute infarction with involvement of left primary sensorimotor cortex. No hemorrhage. Mild chronic microvascular ischemic changes. Electronically Signed   By: Macy Mis M.D.   On: 01/17/2019 21:30   CARDIAC CATHETERIZATION  Result Date: 12/30/2018  Mid RCA lesion is 10% stenosed.  Prox Cx lesion is 30% stenosed.  Hemodynamic findings consistent with moderate pulmonary hypertension.  64 year old female with hypertension hyperlipidemia having progressive episodes of shortness of breath PND orthopnea and congestive heart failure without evidence of myocardial infarction LV angiogram showing severe dilated cardiomyopathy with ejection fraction of 15% Coronary angiogram showing minimal atherosclerosis of left circumflex and right coronary Significant elevation of left end-diastolic pressures, pulmonary pressures, pulmonary capillary wedge pressures Plan Continue intravenous Lasix for volume improvements of significant pulmonary edema and congestive heart failure No further cardiac intervention and/or diagnostics necessary at this time Begin beta-blocker, isosorbide, hydralazine, spironolactone for  acute systolic dysfunction congestive heart failure No use of ACE inhibitor or angiotensin receptor blocker due to side effects and/or allergies Begin cardiac rehabilitation   US Carotid Bilateral (at [facility] and [facility] only)  Result Date: 01/18/2019 CLINICAL DATA:  Stroke. EXAM: BILATERAL CAROTID DUPLEX ULTRASOUND TECHNIQUE: Pearline Cables scale imaging, color Doppler and duplex ultrasound were performed of bilateral carotid and vertebral arteries in the neck. COMPARISON:  None. FINDINGS: Criteria: Quantification of carotid stenosis is based on velocity parameters that correlate the residual internal carotid diameter with NASCET-based stenosis levels, using the diameter of the distal internal carotid lumen as the denominator for stenosis measurement. The following velocity measurements were obtained: RIGHT ICA: 53/28 cm/sec CCA: [id number] cm/sec SYSTOLIC ICA/CCA RATIO:  1.2 ECA: 52 cm/sec LEFT ICA: 45/24 cm/sec CCA: [id number] cm/sec SYSTOLIC ICA/CCA RATIO:  0.9 ECA: 47 cm/sec RIGHT CAROTID ARTERY: Echogenic plaque at the right carotid bulb. External carotid artery is patent with normal waveform. Small amount of  echogenic plaque in the proximal internal carotid artery. Normal waveforms and velocities in the internal carotid artery. RIGHT VERTEBRAL ARTERY: Antegrade flow and normal waveform in the right vertebral artery. LEFT CAROTID ARTERY: Small amount of plaque at the left carotid bulb. External carotid artery is patent with normal waveform. Minimal plaque in the proximal internal carotid artery. Normal waveforms and velocities in the internal carotid artery. LEFT VERTEBRAL ARTERY: Antegrade flow and normal waveform in the left vertebral artery. IMPRESSION: Mild atherosclerotic disease in the carotid arteries. Estimated degree of stenosis in the internal carotid arteries is less than 50% bilaterally. Patent vertebral arteries with antegrade flow. Electronically Signed   By: Markus Daft M.D.   On: 01/18/2019 11:37   DG Chest Port 1  View  Result Date: 01/13/2019 CLINICAL DATA:  Shortness of breath EXAM: PORTABLE CHEST 1 VIEW COMPARISON:  12/29/2018 FINDINGS: Cardiac shadow remains enlarged. Central vascular congestion is noted. Mild edema is noted similar to that seen on the prior exam. No focal infiltrate or effusion is noted. IMPRESSION: Mild changes of CHF. Electronically Signed   By: Inez Catalina M.D.   On: 01/13/2019 22:44   DG Chest Portable 1 View  Result Date: 12/29/2018 CLINICAL DATA:  Shortness of breath EXAM: PORTABLE CHEST 1 VIEW COMPARISON:  10/18/2016 FINDINGS: Mild cardiomegaly. There is mild interstitial pulmonary edema. No focal consolidation. No pneumothorax or sizable pleural effusion. IMPRESSION: Cardiomegaly with mild interstitial pulmonary edema. Electronically Signed   By: Ulyses Jarred M.D.   On: 12/29/2018 01:27   ECHOCARDIOGRAM COMPLETE  Result Date: 01/19/2019   ECHOCARDIOGRAM REPORT   Patient Name:   ZSAZSA BROHL Date of Exam: 01/19/2019 Medical Rec #:  [id number]     Height:       66.0 in Accession #:    [id number]    Weight:       221.0 lb Date of Birth:  02/27/1955    BSA:          2.09 m Patient Age:    40 years      BP:           112/80 mmHg Patient Gender: F             HR:           62 bpm. Exam Location:  [facility] Procedure: 2D Echo, Color Doppler, Cardiac Doppler and Intracardiac            Opacification Agent Indications:     I63.9 Stroke  History:         Patient has prior history of Echocardiogram examinations, most                  recent 12/29/2018. CHF; Risk Factors:Dyslipidemia.  Sonographer:     Charmayne Sheer RDCS (AE) Referring Phys:  [id number] [place] Diagnosing Phys: Neoma Laming MD IMPRESSIONS  1. Left ventricular ejection fraction, by visual estimation, is <20%. The left ventricle has severely decreased function. There is no left ventricular hypertrophy.LVEF 6 PERCENT  2. Definity contrast agent was given IV to delineate the left ventricular endocardial borders.  3. Large, fixed thrombus  on the lateral wall of the left ventricle.  4. Left ventricular diastolic parameters are consistent with Grade III diastolic dysfunction (restrictive).  5. Severely dilated left ventricular internal cavity size.  6. The left ventricle demonstrates global hypokinesis.  7. Global right ventricle has normal systolic function.The right ventricular size is severely enlarged. No increase in right ventricular wall thickness.  8. Left  atrial size was severely dilated.  9. Right atrial size was severely dilated. 10. The mitral valve is normal in structure. Trivial mitral valve regurgitation. No evidence of mitral stenosis. 11. The tricuspid valve is normal in structure. 12. The aortic valve is normal in structure. Aortic valve regurgitation is not visualized. No evidence of aortic valve sclerosis or stenosis. 13. The pulmonic valve was normal in structure. Pulmonic valve regurgitation is not visualized. 14. Moderately elevated pulmonary artery systolic pressure. 15. The inferior vena cava is normal in size with greater than 50% respiratory variability, suggesting right atrial pressure of 3 mmHg. FINDINGS  Left Ventricle: Left ventricular ejection fraction, by visual estimation, is <20%. The left ventricle has severely decreased function. Definity contrast agent was given IV to delineate the left ventricular endocardial borders. The left ventricle demonstrates global hypokinesis. The left ventricular internal cavity size was severely dilated left ventricle. There is no left ventricular hypertrophy. Left ventricular diastolic parameters are consistent with Grade III diastolic dysfunction (restrictive). Normal left atrial pressure. There is a large, fixed, lateral left ventricular thrombus. The thrombus appears protruding and regular in shape and solid in texture. Right Ventricle: The right ventricular size is severely enlarged. No increase in right ventricular wall thickness. Global RV systolic function is has normal systolic  function. The tricuspid regurgitant velocity is 2.85 m/s, and with an assumed right atrial pressure of 10 mmHg, the estimated right ventricular systolic pressure is moderately elevated at 42.5 mmHg. Left Atrium: Left atrial size was severely dilated. Right Atrium: Right atrial size was severely dilated Pericardium: There is no evidence of pericardial effusion. Mitral Valve: The mitral valve is normal in structure. Trivial mitral valve regurgitation. No evidence of mitral valve stenosis by observation. MV peak gradient, 3.9 mmHg. Tricuspid Valve: The tricuspid valve is normal in structure. Tricuspid valve regurgitation is mild. Aortic Valve: The aortic valve is normal in structure. Aortic valve regurgitation is not visualized. The aortic valve is structurally normal, with no evidence of sclerosis or stenosis. Aortic valve mean gradient measures 4.0 mmHg. Aortic valve peak gradient measures 7.3 mmHg. Aortic valve area, by VTI measures 2.03 cm. Pulmonic Valve: The pulmonic valve was normal in structure. Pulmonic valve regurgitation is not visualized. Pulmonic regurgitation is not visualized. Aorta: The aortic root, ascending aorta and aortic arch are all structurally normal, with no evidence of dilitation or obstruction. Venous: The inferior vena cava is normal in size with greater than 50% respiratory variability, suggesting right atrial pressure of 3 mmHg. IAS/Shunts: No atrial level shunt detected by color flow Doppler. There is no evidence of a patent foramen ovale. No ventricular septal defect is seen or detected. There is no evidence of an atrial septal defect.  LEFT VENTRICLE PLAX 2D LVIDd:         6.50 cm  Diastology LVIDs:         6.28 cm  LV e' lateral:   4.90 cm/s LV PW:         1.22 cm  LV E/e' lateral: 19.9 LV IVS:        0.91 cm  LV e' medial:    4.57 cm/s LVOT diam:     2.40 cm  LV E/e' medial:  21.3 LV SV:         17 ml LV SV Index:   7.55 LVOT Area:     4.52 cm  RIGHT VENTRICLE RV Basal diam:  4.40  cm LEFT ATRIUM           Index  RIGHT ATRIUM           Index LA diam:      4.50 cm 2.16 cm/m  RA Area:     15.40 cm LA Vol (A4C): 92.0 ml 44.09 ml/m RA Volume:   38.90 ml  18.64 ml/m  AORTIC VALVE                    PULMONIC VALVE AV Area (Vmax):    2.45 cm     PV Vmax:       0.58 m/s AV Area (Vmean):   2.15 cm     PV Vmean:      40.800 cm/s AV Area (VTI):     2.03 cm     PV VTI:        0.098 m AV Vmax:           135.00 cm/s  PV Peak grad:  1.3 mmHg AV Vmean:          101.000 cm/s PV Mean grad:  1.0 mmHg AV VTI:            0.252 m AV Peak Grad:      7.3 mmHg AV Mean Grad:      4.0 mmHg LVOT Vmax:         73.20 cm/s LVOT Vmean:        47.900 cm/s LVOT VTI:          0.113 m LVOT/AV VTI ratio: 0.45  AORTA Ao Root diam: 3.20 cm MITRAL VALVE                        TRICUSPID VALVE MV Area (PHT): 4.06 cm             TR Peak grad:   32.5 mmHg MV Peak grad:  3.9 mmHg             TR Vmax:        285.00 cm/s MV Mean grad:  1.0 mmHg MV Vmax:       0.99 m/s             SHUNTS MV Vmean:      48.8 cm/s            Systemic VTI:  0.11 m MV VTI:        0.21 m               Systemic Diam: 2.40 cm MV PHT:        54.23 msec MV Decel Time: 187 msec MV E velocity: 97.50 cm/s 103 cm/s MV A velocity: 80.00 cm/s 70.3 cm/s MV E/A ratio:  1.22       1.5  Neoma Laming MD Electronically signed by Neoma Laming MD Signature Date/Time: 01/19/2019/2:46:46 PM    Final    ECHOCARDIOGRAM COMPLETE  Result Date: 12/29/2018   ECHOCARDIOGRAM REPORT   Patient Name:   DASHANA GROOVER Date of Exam: 12/29/2018 Medical Rec #:  [id number]     Height:       67.0 in Accession #:    [id number]    Weight:       225.0 lb Date of Birth:  05/04/1955    BSA:          2.13 m Patient Age:    50 years      BP:           125/85 mmHg Patient Gender: F  HR:           61 bpm. Exam Location:  [facility] Procedure: 2D Echo, Cardiac Doppler and Color Doppler Indications:     CHF- acute diastolic [id number]  History:         Patient has no prior history of  Echocardiogram examinations.                  Hyperlipidemia, renal disorder.  Sonographer:     [name] RDCS (AE) Referring Phys:  [id number] Arvella Merles MANSY Diagnosing Phys: Kathlyn Sacramento MD IMPRESSIONS  1. Left ventricular ejection fraction, by visual estimation, is <20%. The left ventricle has normal function. There is borderline left ventricular hypertrophy.  2. Left ventricular diastolic parameters are consistent with Grade III diastolic dysfunction (restrictive).  3. Severely dilated left ventricular internal cavity size.  4. The left ventricle demonstrates global hypokinesis.  5. Global right ventricle has normal systolic function.The right ventricular size is normal. No increase in right ventricular wall thickness.  6. Left atrial size was moderately dilated.  7. Right atrial size was normal.  8. Small pericardial effusion.  9. The pericardial effusion is posterior to the left ventricle. 10. The mitral valve is normal in structure. Mild to moderate mitral valve regurgitation. No evidence of mitral stenosis. 11. The tricuspid valve is normal in structure. Tricuspid valve regurgitation is trivial. 12. The aortic valve is normal in structure. Aortic valve regurgitation is not visualized. No evidence of aortic valve sclerosis or stenosis. 13. The pulmonic valve was normal in structure. Pulmonic valve regurgitation is not visualized. 14. TR signal is inadequate for assessing pulmonary artery systolic pressure. FINDINGS  Left Ventricle: Left ventricular ejection fraction, by visual estimation, is <20%. The left ventricle has normal function. The left ventricle demonstrates global hypokinesis. The left ventricular internal cavity size was severely dilated left ventricle.  There is borderline left ventricular hypertrophy. Left ventricular diastolic parameters are consistent with Grade III diastolic dysfunction (restrictive). Normal left atrial pressure. Right Ventricle: The right ventricular size is normal. No increase  in right ventricular wall thickness. Global RV systolic function is has normal systolic function. The tricuspid regurgitant velocity is 2.70 m/s, and with an assumed right atrial pressure  of 10 mmHg, the estimated right ventricular systolic pressure is TR signal is inadequate for assessing PA pressure at 39.1 mmHg. Left Atrium: Left atrial size was moderately dilated. Right Atrium: Right atrial size was normal in size Pericardium: A small pericardial effusion is present. The pericardial effusion is posterior to the left ventricle. Mitral Valve: The mitral valve is normal in structure. Mild to moderate mitral valve regurgitation. No evidence of mitral valve stenosis by observation. Tricuspid Valve: The tricuspid valve is normal in structure. Tricuspid valve regurgitation is trivial. Aortic Valve: The aortic valve is normal in structure. Aortic valve regurgitation is not visualized. The aortic valve is structurally normal, with no evidence of sclerosis or stenosis. Aortic valve mean gradient measures 5.0 mmHg. Aortic valve peak gradient measures 7.6 mmHg. Aortic valve area, by VTI measures 1.97 cm. Pulmonic Valve: The pulmonic valve was normal in structure. Pulmonic valve regurgitation is not visualized. Pulmonic regurgitation is not visualized. Aorta: The aortic root, ascending aorta and aortic arch are all structurally normal, with no evidence of dilitation or obstruction. Venous: The inferior vena cava was not well visualized. IAS/Shunts: No atrial level shunt detected by color flow Doppler. There is no evidence of a patent foramen ovale. No ventricular septal defect is seen or detected. There is no  evidence of an atrial septal defect.  LEFT VENTRICLE PLAX 2D LVIDd:         6.54 cm       Diastology LVIDs:         6.17 cm       LV e' lateral:   2.72 cm/s LV PW:         1.48 cm       LV E/e' lateral: 38.6 LV IVS:        0.85 cm       LV e' medial:    5.33 cm/s LVOT diam:     2.30 cm       LV E/e' medial:  19.7 LV  SV:         27 ml LV SV Index:   12.15 LVOT Area:     4.15 cm  LV Volumes (MOD) LV area d, A2C:    62.40 cm LV area d, A4C:    55.70 cm LV area s, A2C:    52.10 cm LV area s, A4C:    47.50 cm LV major d, A2C:   8.92 cm LV major d, A4C:   8.54 cm LV major s, A2C:   8.40 cm LV major s, A4C:   8.18 cm LV vol d, MOD A2C: 362.0 ml LV vol d, MOD A4C: 303.0 ml LV vol s, MOD A2C: 272.0 ml LV vol s, MOD A4C: 228.0 ml LV SV MOD A2C:     90.0 ml LV SV MOD A4C:     303.0 ml LV SV MOD BP:      85.8 ml RIGHT VENTRICLE RV Basal diam:  3.26 cm RV S prime:     10.10 cm/s TAPSE (M-mode): 4.0 cm LEFT ATRIUM              Index       RIGHT ATRIUM           Index LA diam:        5.10 cm  2.40 cm/m  RA Area:     15.00 cm LA Vol (A2C):   132.0 ml 62.09 ml/m RA Volume:   42.30 ml  19.90 ml/m LA Vol (A4C):   57.9 ml  27.23 ml/m LA Biplane Vol: 91.0 ml  42.80 ml/m  AORTIC VALVE                    PULMONIC VALVE AV Area (Vmax):    1.56 cm     RVOT Peak grad: 3 mmHg AV Area (Vmean):   1.48 cm AV Area (VTI):     1.97 cm AV Vmax:           137.50 cm/s AV Vmean:          100.650 cm/s AV VTI:            0.253 m AV Peak Grad:      7.6 mmHg AV Mean Grad:      5.0 mmHg LVOT Vmax:         51.70 cm/s LVOT Vmean:        35.900 cm/s LVOT VTI:          0.120 m LVOT/AV VTI ratio: 0.47  AORTA Ao Root diam: 2.70 cm MITRAL VALVE                         TRICUSPID VALVE MV Area (PHT): 3.77 cm  TR Peak grad:   29.1 mmHg MV PHT:        58.29 msec            TR Vmax:        270.00 cm/s MV Decel Time: 201 msec MV E velocity: 105.00 cm/s 103 cm/s  SHUNTS MV A velocity: 40.30 cm/s  70.3 cm/s Systemic VTI:  0.12 m MV E/A ratio:  2.61        1.5       Systemic Diam: 2.30 cm  Kathlyn Sacramento MD Electronically signed by Kathlyn Sacramento MD Signature Date/Time: 12/29/2018/1:55:43 PM    Final    CT HEAD CODE STROKE WO CONTRAST  Result Date: 01/17/2019 CLINICAL DATA:  Code stroke. EXAM: CT HEAD WITHOUT CONTRAST TECHNIQUE: Contiguous axial images  were obtained from the base of the skull through the vertex without intravenous contrast. COMPARISON:  None. FINDINGS: Brain: There is no acute intracranial hemorrhage, mass-effect, or edema. Gray-white differentiation is preserved. There is no extra-axial fluid collection. Ventricles and sulci are within normal limits in size and configuration. Vascular: No hyperdense vessel.There is atherosclerotic calcification at the skull base. Skull: Calvarium is unremarkable. Sinuses/Orbits: No acute finding. Other: None. ASPECTS [facility] Stroke Program Early CT Score) - Ganglionic level infarction (caudate, lentiform nuclei, internal capsule, insula, M1-M3 cortex): 7 - Supraganglionic infarction (M4-M6 cortex): 3 Total score (0-10 with 10 being normal): 10 IMPRESSION: 1. No acute intracranial hemorrhage or evidence of acute infarction. 2. ASPECTS is 10 These results were called by telephone at the time of interpretation on 01/17/2019 at 7:28 pm to provider [facility] , who verbally acknowledged these results. Electronically Signed   By: Macy Mis M.D.   On: 01/17/2019 19:29    Microbiology: Recent Results (from the past 240 hour(s))  SARS CORONAVIRUS 2 (TAT 6-24 HRS) Nasopharyngeal Nasopharyngeal Swab     Status: None   Collection Time: 01/18/19  1:27 AM   Specimen: Nasopharyngeal Swab  Result Value Ref Range Status   SARS Coronavirus 2 NEGATIVE NEGATIVE Final    Comment: (NOTE) SARS-CoV-2 target nucleic acids are NOT DETECTED. The SARS-CoV-2 RNA is generally detectable in upper and lower respiratory specimens during the acute phase of infection. Negative results do not preclude SARS-CoV-2 infection, do not rule out co-infections with other pathogens, and should not be used as the sole basis for treatment or other patient management decisions. Negative results must be combined with clinical observations, patient history, and epidemiological information. The expected result is Negative. Fact Sheet  for Patients: SugarRoll.be Fact Sheet for Healthcare Providers: https://www.woods-mathews.com/ This test is not yet approved or cleared by the [place] FDA and  has been authorized for detection and/or diagnosis of SARS-CoV-2 by FDA under an Emergency Use Authorization (EUA). This EUA will remain  in effect (meaning this test can be used) for the duration of the COVID-19 declaration under Section 56 4(b)(1) of the Act, 21 U.S.C. section 360bbb-3(b)(1), unless the authorization is terminated or revoked sooner. Performed at [facility], [place] [address] [place], [place] [address]      Labs: CBC: Recent Labs  Lab 01/19/19 1655 01/20/19 1019 01/21/19 0658  WBC 4.6 4.6 4.2  HGB 13.3 11.8* 11.7*  HCT 44.1 38.5 38.9  MCV 86.5 85.0 85.9  PLT 293 268 [id number]   Basic Metabolic Panel: Recent Labs  Lab 01/20/19 1019 01/21/19 0658  NA 138 139  K 4.1 4.3  CL 108 110  CO2 22 23  GLUCOSE 163* 111*  BUN 41* 38*  CREATININE 1.96* 1.77*  CALCIUM 9.1 9.4   Liver Function Tests: No results for input(s): AST, ALT, ALKPHOS, BILITOT, PROT, ALBUMIN in the last 168 hours. No results for input(s): LIPASE, AMYLASE in the last 168 hours. No results for input(s): AMMONIA in the last 168 hours. Cardiac Enzymes: No results for input(s): CKTOTAL, CKMB, CKMBINDEX, TROPONINI in the last 168 hours. BNP (last 3 results) Recent Labs    12/29/18 0057 01/13/19 2223  BNP 767.0* 1,147.0*   CBG: No results for input(s): GLUCAP in the last 168 hours.  Time spent: 35 minutes  Signed:  Berle Mull  [facility] 01/21/2019 9:29 PM

## 2019-01-27 ENCOUNTER — Ambulatory Visit: Payer: Medicaid Other | Admitting: Cardiology

## 2019-01-28 ENCOUNTER — Encounter: Payer: Self-pay | Admitting: Cardiology

## 2019-01-31 ENCOUNTER — Other Ambulatory Visit: Payer: Self-pay

## 2019-01-31 ENCOUNTER — Inpatient Hospital Stay
Admission: EM | Admit: 2019-01-31 | Discharge: 2019-02-04 | DRG: 291 | Disposition: A | Discharge status: Home / Self Care | Payer: Medicaid Other | Attending: Internal Medicine | Admitting: Internal Medicine

## 2019-01-31 ENCOUNTER — Emergency Department: Payer: Medicaid Other

## 2019-01-31 DIAGNOSIS — J9691 Respiratory failure, unspecified with hypoxia: Secondary | ICD-10-CM | POA: Diagnosis not present

## 2019-01-31 DIAGNOSIS — Z20822 Contact with and (suspected) exposure to covid-19: Secondary | ICD-10-CM | POA: Diagnosis present

## 2019-01-31 DIAGNOSIS — I130 Hypertensive heart and chronic kidney disease with heart failure and stage 1 through stage 4 chronic kidney disease, or unspecified chronic kidney disease: Principal | ICD-10-CM | POA: Diagnosis present

## 2019-01-31 DIAGNOSIS — I5020 Unspecified systolic (congestive) heart failure: Secondary | ICD-10-CM | POA: Diagnosis not present

## 2019-01-31 DIAGNOSIS — Z8673 Personal history of transient ischemic attack (TIA), and cerebral infarction without residual deficits: Secondary | ICD-10-CM | POA: Diagnosis not present

## 2019-01-31 DIAGNOSIS — Z79899 Other long term (current) drug therapy: Secondary | ICD-10-CM | POA: Diagnosis not present

## 2019-01-31 DIAGNOSIS — Z880 Allergy status to penicillin: Secondary | ICD-10-CM

## 2019-01-31 DIAGNOSIS — Z7901 Long term (current) use of anticoagulants: Secondary | ICD-10-CM | POA: Diagnosis not present

## 2019-01-31 DIAGNOSIS — I639 Cerebral infarction, unspecified: Secondary | ICD-10-CM | POA: Diagnosis not present

## 2019-01-31 DIAGNOSIS — F329 Major depressive disorder, single episode, unspecified: Secondary | ICD-10-CM | POA: Diagnosis present

## 2019-01-31 DIAGNOSIS — F1490 Cocaine use, unspecified, uncomplicated: Secondary | ICD-10-CM | POA: Diagnosis present

## 2019-01-31 DIAGNOSIS — N189 Chronic kidney disease, unspecified: Secondary | ICD-10-CM

## 2019-01-31 DIAGNOSIS — I5023 Acute on chronic systolic (congestive) heart failure: Secondary | ICD-10-CM | POA: Diagnosis present

## 2019-01-31 DIAGNOSIS — N183 Chronic kidney disease, stage 3 unspecified: Secondary | ICD-10-CM | POA: Diagnosis present

## 2019-01-31 DIAGNOSIS — N179 Acute kidney failure, unspecified: Secondary | ICD-10-CM | POA: Diagnosis present

## 2019-01-31 DIAGNOSIS — F17200 Nicotine dependence, unspecified, uncomplicated: Secondary | ICD-10-CM | POA: Diagnosis present

## 2019-01-31 DIAGNOSIS — Z888 Allergy status to other drugs, medicaments and biological substances status: Secondary | ICD-10-CM

## 2019-01-31 DIAGNOSIS — I420 Dilated cardiomyopathy: Secondary | ICD-10-CM | POA: Diagnosis present

## 2019-01-31 DIAGNOSIS — I513 Intracardiac thrombosis, not elsewhere classified: Secondary | ICD-10-CM | POA: Diagnosis present

## 2019-01-31 DIAGNOSIS — Z6834 Body mass index (BMI) 34.0-34.9, adult: Secondary | ICD-10-CM

## 2019-01-31 DIAGNOSIS — E785 Hyperlipidemia, unspecified: Secondary | ICD-10-CM | POA: Diagnosis present

## 2019-01-31 DIAGNOSIS — D649 Anemia, unspecified: Secondary | ICD-10-CM | POA: Diagnosis present

## 2019-01-31 DIAGNOSIS — R0600 Dyspnea, unspecified: Secondary | ICD-10-CM

## 2019-01-31 DIAGNOSIS — E669 Obesity, unspecified: Secondary | ICD-10-CM | POA: Diagnosis present

## 2019-01-31 DIAGNOSIS — J9601 Acute respiratory failure with hypoxia: Secondary | ICD-10-CM | POA: Diagnosis present

## 2019-01-31 DIAGNOSIS — I509 Heart failure, unspecified: Secondary | ICD-10-CM

## 2019-01-31 LAB — PROCALCITONIN: Procalcitonin: <0.1 ng/mL

## 2019-01-31 LAB — CBC WITH DIFFERENTIAL/PLATELET
Abs Immature Granulocytes: 0.01 10*3/uL (ref 0.00–0.07)
Basophils Absolute: 0 10*3/uL (ref 0.0–0.1)
Basophils Relative: 1 %
Eosinophils Absolute: 0.3 10*3/uL (ref 0.0–0.5)
Eosinophils Relative: 6 %
HCT: 38.8 % (ref 36.0–46.0)
Hemoglobin: 11.8 g/dL — ABNORMAL LOW (ref 12.0–15.0)
Immature Granulocytes: 0 %
Lymphocytes Relative: 21 %
Lymphs Abs: 1.2 10*3/uL (ref 0.7–4.0)
MCH: 26.3 pg (ref 26.0–34.0)
MCHC: 30.4 g/dL (ref 30.0–36.0)
MCV: 86.4 fL (ref 80.0–100.0)
Monocytes Absolute: 0.6 10*3/uL (ref 0.1–1.0)
Monocytes Relative: 10 %
Neutro Abs: 3.5 10*3/uL (ref 1.7–7.7)
Neutrophils Relative %: 62 %
Platelets: 313 10*3/uL (ref 150–400)
RBC: 4.49 MIL/uL (ref 3.87–5.11)
RDW: 16 % — ABNORMAL HIGH (ref 11.5–15.5)
WBC: 5.6 10*3/uL (ref 4.0–10.5)
nRBC: 0 % (ref 0.0–0.2)

## 2019-01-31 LAB — CBC
HCT: 38.9 % (ref 36.0–46.0)
Hemoglobin: 11.9 g/dL — ABNORMAL LOW (ref 12.0–15.0)
MCH: 26 pg (ref 26.0–34.0)
MCHC: 30.6 g/dL (ref 30.0–36.0)
MCV: 85.1 fL (ref 80.0–100.0)
Platelets: 340 10*3/uL (ref 150–400)
RBC: 4.57 MIL/uL (ref 3.87–5.11)
RDW: 16 % — ABNORMAL HIGH (ref 11.5–15.5)
WBC: 5.6 10*3/uL (ref 4.0–10.5)
nRBC: 0 % (ref 0.0–0.2)

## 2019-01-31 LAB — SARS CORONAVIRUS 2 (TAT 6-24 HRS): SARS Coronavirus 2: NEGATIVE

## 2019-01-31 LAB — BASIC METABOLIC PANEL
Anion gap: 9 (ref 5–15)
BUN: 27 mg/dL — ABNORMAL HIGH (ref 8–23)
CO2: 22 mmol/L (ref 22–32)
Calcium: 8.9 mg/dL (ref 8.9–10.3)
Chloride: 111 mmol/L (ref 98–111)
Creatinine, Ser: 1.99 mg/dL — ABNORMAL HIGH (ref 0.44–1.00)
GFR calc Af Amer: 30 mL/min — ABNORMAL LOW (ref >60)
GFR calc non Af Amer: 26 mL/min — ABNORMAL LOW (ref >60)
Glucose, Bld: 157 mg/dL — ABNORMAL HIGH (ref 70–99)
Potassium: 3.8 mmol/L (ref 3.5–5.1)
Sodium: 142 mmol/L (ref 135–145)

## 2019-01-31 LAB — BRAIN NATRIURETIC PEPTIDE: B Natriuretic Peptide: 1875 pg/mL — ABNORMAL HIGH (ref 0.0–100.0)

## 2019-01-31 LAB — TROPONIN I (HIGH SENSITIVITY): Troponin I (High Sensitivity): 35 ng/L — ABNORMAL HIGH (ref <18)

## 2019-01-31 LAB — HEMOGLOBIN A1C
Hgb A1c MFr Bld: 5.7 % — ABNORMAL HIGH (ref 4.8–5.6)
Mean Plasma Glucose: 116.89 mg/dL

## 2019-01-31 MED ORDER — APIXABAN 5 MG PO TABS
5.0000 mg | ORAL_TABLET | Freq: Two times a day (BID) | ORAL | Status: DC
  Administered 2019-01-31 – 2019-02-04 (×9): 5 mg via ORAL
  Filled 2019-01-31 (×9): qty 1

## 2019-01-31 MED ORDER — CARVEDILOL 3.125 MG PO TABS
3.1250 mg | ORAL_TABLET | Freq: Two times a day (BID) | ORAL | Status: DC
  Administered 2019-01-31 – 2019-02-04 (×9): 3.125 mg via ORAL
  Filled 2019-01-31 (×9): qty 1

## 2019-01-31 MED ORDER — SODIUM CHLORIDE 0.9 % IV SOLN
8.0000 mg | Freq: Once | INTRAVENOUS | Status: DC
  Filled 2019-01-31: qty 4

## 2019-01-31 MED ORDER — NITROGLYCERIN 0.4 MG SL SUBL: 0.4000 mg | SUBLINGUAL_TABLET | SUBLINGUAL | Status: DC | PRN

## 2019-01-31 MED ORDER — TRAZODONE HCL 50 MG PO TABS: 150.0000 mg | ORAL_TABLET | Freq: Every evening | ORAL | Status: DC | PRN

## 2019-01-31 MED ORDER — HEPARIN SODIUM (PORCINE) 5000 UNIT/ML IJ SOLN: 5000.0000 [IU] | Freq: Three times a day (TID) | INTRAMUSCULAR | Status: DC

## 2019-01-31 MED ORDER — FUROSEMIDE 10 MG/ML IJ SOLN
40.0000 mg | Freq: Two times a day (BID) | INTRAMUSCULAR | Status: DC
  Administered 2019-01-31 (×2): 40 mg via INTRAVENOUS
  Filled 2019-01-31 (×3): qty 4

## 2019-01-31 MED ORDER — FUROSEMIDE 10 MG/ML IJ SOLN
40.0000 mg | Freq: Once | INTRAMUSCULAR | Status: AC
  Administered 2019-01-31: 40 mg via INTRAVENOUS
  Filled 2019-01-31: qty 4

## 2019-01-31 MED ORDER — ATORVASTATIN CALCIUM 20 MG PO TABS
20.0000 mg | ORAL_TABLET | Freq: Every day | ORAL | Status: DC
  Administered 2019-01-31 – 2019-02-03 (×4): 20 mg via ORAL
  Filled 2019-01-31 (×4): qty 1

## 2019-01-31 MED ORDER — SPIRONOLACTONE 25 MG PO TABS
12.5000 mg | ORAL_TABLET | Freq: Every day | ORAL | Status: DC
  Administered 2019-01-31 – 2019-02-04 (×5): 12.5 mg via ORAL
  Filled 2019-01-31: qty 1
  Filled 2019-01-31: qty 0.5
  Filled 2019-01-31: qty 1
  Filled 2019-01-31: qty 0.5
  Filled 2019-01-31 (×2): qty 1
  Filled 2019-01-31 (×3): qty 0.5

## 2019-01-31 MED ORDER — LORATADINE 10 MG PO TABS
10.0000 mg | ORAL_TABLET | Freq: Every day | ORAL | Status: DC
  Administered 2019-01-31 – 2019-02-04 (×5): 10 mg via ORAL
  Filled 2019-01-31 (×5): qty 1

## 2019-01-31 MED ORDER — ONDANSETRON HCL 4 MG/2ML IJ SOLN
4.0000 mg | Freq: Once | INTRAMUSCULAR | Status: AC
  Administered 2019-01-31: 4 mg via INTRAVENOUS
  Filled 2019-01-31: qty 2

## 2019-01-31 MED ORDER — ALBUTEROL SULFATE (2.5 MG/3ML) 0.083% IN NEBU: 2.5000 mg | INHALATION_SOLUTION | Freq: Four times a day (QID) | RESPIRATORY_TRACT | Status: DC | PRN

## 2019-01-31 MED ORDER — FLUOXETINE HCL 20 MG PO CAPS
40.0000 mg | ORAL_CAPSULE | Freq: Every day | ORAL | Status: DC
  Administered 2019-01-31 – 2019-02-04 (×5): 40 mg via ORAL
  Filled 2019-01-31 (×5): qty 2

## 2019-01-31 MED ORDER — MORPHINE SULFATE (PF) 2 MG/ML IV SOLN: 1.0000 mg | Freq: Once | INTRAVENOUS | Status: DC

## 2019-01-31 NOTE — ED Provider Notes (Addendum)
[facility] Emergency Department Provider Note  ____________________________________________   First MD Initiated Contact with Patient 01/31/19 [phone]     (approximate)  I have reviewed the triage vital signs and the nursing notes.   HISTORY  Chief Complaint Shortness of Breath    HPI Katelyn Lane is a 64 y.o. female with extensive chronic medical history including heart failure with an ejection fraction of well less than 20% (estimated recently as 6% on EKG that was about 2 weeks ago) who sees Dr. Nehemiah Massed for cardiology.  She presents tonight by EMS for worsening shortness of breath over the last 24 hours.   She says that that is gradually been getting worse and nothing helps.  It got much worse when she was asleep and got up to go to the bathroom and she was extremely winded by the time she tried to get back to bed.  She felt a little bit better after couple of DuoNeb's by EMS but she still feels short of breath and feels like she is wheezing.  She denies fever and says she has been tested multiple times recently for COVID-19 and has always been negative.  She denies sore throat, chest pain, nausea, vomiting, and abdominal pain.  Of note she states that her doctor recently discontinued her Lasix and she is only taking spironolactone.  She formally took Lasix 40 mg a day.  She was also recently admitted to the hospital for an acute CVA and says she has a little bit of a "drooling problem" after the stroke but otherwise she has no complaints.        Past Medical History:  Diagnosis Date  . CHF (congestive heart failure) ([place])   . Hyperlipidemia   . Renal disorder     Patient Active Problem List   Diagnosis Date Noted  . Stroke ([place]) 01/18/2019  . Dilated cardiomyopathy ([place]) 12/31/2018  . Chest tightness 12/31/2018  . Elevated troponin 12/31/2018  . Hypertensive urgency 12/31/2018  . Acute kidney injury superimposed on CKD ([place]) 12/31/2018  .  Dyslipidemia 12/31/2018  . Depression 12/31/2018  . HFrEF (heart failure with reduced ejection fraction) ([place]) 12/29/2018    Past Surgical History:  Procedure Laterality Date  . RIGHT/LEFT HEART CATH AND CORONARY ANGIOGRAPHY N/A 12/30/2018   Procedure: RIGHT/LEFT HEART CATH AND CORONARY ANGIOGRAPHY;  Surgeon: Corey Skains, MD;  Location: [facility];  Service: Cardiovascular;  Laterality: N/A;    Prior to Admission medications   Medication Sig Start Date End Date Taking? Authorizing Provider  albuterol (VENTOLIN HFA) 108 (90 Base) MCG/ACT inhaler Inhale 2 puffs into the lungs every 6 (six) hours as needed for wheezing or shortness of breath. 01/15/19   Rudene Re, MD  Apixaban Starter Pack [facility] DVT/PE STARTER PACK) 5 MG TBPK Take as directed on package: start with two-5mg  tablets twice daily for 7 days. On day 8, switch to one-5mg  tablet twice daily. 01/21/19   Lavina Hamman, MD  atorvastatin (LIPITOR) 20 MG tablet Take 20 mg by mouth daily at 6 PM.     [provider]  carvedilol (COREG) 3.125 MG tablet Take 1 tablet (3.125 mg total) by mouth 2 (two) times daily with a meal. 01/01/19   Nicole Kindred A, DO  FLUoxetine (PROZAC) 40 MG capsule Take 40 mg by mouth daily.     [provider]  furosemide (LASIX) 40 MG tablet Take 1 tablet (40 mg total) by mouth daily. 01/21/19 03/22/19  Lavina Hamman,  MD  loratadine (CLARITIN) 10 MG tablet Take 1 tablet (10 mg total) by mouth daily. Patient taking differently: Take 10 mg by mouth daily as needed for itching.  01/02/19   Ezekiel Slocumb, DO  nitroGLYCERIN (NITROSTAT) 0.4 MG SL tablet Place 1 tablet (0.4 mg total) under the tongue every 5 (five) minutes as needed for chest pain. 01/01/19   Ezekiel Slocumb, DO  spironolactone (ALDACTONE) 25 MG tablet Take 0.5 tablets (12.5 mg total) by mouth daily. 01/02/19   Ezekiel Slocumb, DO  traZODone (DESYREL) 100 MG tablet Take 150 mg by mouth at bedtime as  needed for sleep.  09/08/13   [provider]    Allergies Penicillins and Ace inhibitors  Family History  Problem Relation Age of Onset  . Breast cancer Neg Hx     Social History Social History   Tobacco Use  . Smoking status: Current Every Day Smoker  . Smokeless tobacco: Never Used  Substance Use Topics  . Alcohol use: No    Alcohol/week: 0.0 standard drinks  . Drug use: Yes    Types: Cocaine    Review of Systems Constitutional: No fever/chills Eyes: No visual changes. ENT: No sore throat. Cardiovascular: Denies chest pain. Respiratory: +shortness of breath. Gastrointestinal: No abdominal pain.  No nausea, no vomiting.  No diarrhea.  No constipation. Genitourinary: Negative for dysuria. Musculoskeletal: Negative for neck pain.  Negative for back pain. Integumentary: Negative for rash. Neurological: Recent stroke.  No acute headache or focal weakness or numbness.   ____________________________________________   PHYSICAL EXAM:  VITAL SIGNS: ED Triage Vitals  Enc Vitals Group     BP 01/31/19 0459 (!) 130/101     Pulse Rate 01/31/19 0459 79     Resp 01/31/19 0459 (!) 24     Temp 01/31/19 0459 (!) 97.5 F (36.4 C)     Temp Source 01/31/19 0459 Oral     SpO2 01/31/19 0459 96 %     Weight --      Height --      Head Circumference --      Peak Flow --      Pain Score 01/31/19 0600 0     Pain Loc --      Pain Edu? --      Excl. in [place]? --     Constitutional: Alert and oriented.  Eyes: Conjunctivae are normal.  Head: Atraumatic. Nose: No congestion/rhinnorhea. Mouth/Throat: Patient is wearing a mask. Neck: No stridor.  No meningeal signs.   Cardiovascular: Normal rate, regular rhythm. Good peripheral circulation. Grossly normal heart sounds. Respiratory: Slightly increased respiratory rate and effort with some wheezing but good air movement.  Coarse breath sounds auscultated throughout. Gastrointestinal: Soft and nontender. No distention.    Musculoskeletal: No lower extremity tenderness nor edema. No gross deformities of extremities. Neurologic:  Normal speech and language. No gross focal neurologic deficits are appreciated.  Skin:  Skin is warm, dry and intact.   ____________________________________________   LABS (all labs ordered are listed, but only abnormal results are displayed)  Labs Reviewed  CBC WITH DIFFERENTIAL/PLATELET - Abnormal; Notable for the following components:      Result Value   Hemoglobin 11.8 (*)    RDW 16.0 (*)    All other components within normal limits  BASIC METABOLIC PANEL - Abnormal; Notable for the following components:   Glucose, Bld 157 (*)    BUN 27 (*)    Creatinine, Ser 1.99 (*)    GFR calc  non Af Amer 26 (*)    GFR calc Af Amer 30 (*)    All other components within normal limits  BRAIN NATRIURETIC PEPTIDE - Abnormal; Notable for the following components:   B Natriuretic Peptide 1,875.0 (*)    All other components within normal limits  TROPONIN I (HIGH SENSITIVITY) - Abnormal; Notable for the following components:   Troponin I (High Sensitivity) 35 (*)    All other components within normal limits  SARS CORONAVIRUS 2 (TAT 6-24 HRS)  PROCALCITONIN   ____________________________________________  EKG  ED ECG REPORT I, Hinda Kehr, the attending physician, personally viewed and interpreted this ECG.  Date: 01/31/2019 EKG Time: 6:24 AM Rate: 79 Rhythm: normal sinus rhythm QRS Axis: normal Intervals: IVCD, LVH ST/T Wave abnormalities: Non-specific ST segment / T-wave changes, but no clear evidence of acute ischemia. Narrative Interpretation: no definitive evidence of acute ischemia; does not meet STEMI criteria.   ____________________________________________  RADIOLOGY I, Hinda Kehr, personally viewed and evaluated these images (plain radiographs) as part of my medical decision making, as well as reviewing the written report by the radiologist.  ED MD  interpretation: Interstitial edema and suspected small bilateral pleural effusions.  Doubt pneumonia.  Official radiology report(s): DG Chest Portable 1 View  Result Date: 01/31/2019 CLINICAL DATA:  Dyspnea EXAM: PORTABLE CHEST 1 VIEW COMPARISON:  01/15/2019 FINDINGS: Cardiomegaly with mild interstitial edema and small right pleural effusion. Left lower lung opacity, favoring a combination of atelectasis and small left pleural effusion, although retrocardiac pneumonia is difficult to exclude. No pneumothorax. IMPRESSION: Cardiomegaly with mild interstitial edema and suspected small bilateral pleural effusions. Left lower lobe opacity, likely atelectasis, pneumonia not excluded. Electronically Signed   By: Julian Hy M.D.   On: 01/31/2019 05:27    ____________________________________________   PROCEDURES   Procedure(s) performed (including Critical Care):  .Critical Care Performed by: Hinda Kehr, MD Authorized by: Hinda Kehr, MD   Critical care provider statement:    Critical care time (minutes):  30   Critical care time was exclusive of:  Separately billable procedures and treating other patients   Critical care was necessary to treat or prevent imminent or life-threatening deterioration of the following conditions:  Cardiac failure   Critical care was time spent personally by me on the following activities:  Development of treatment plan with patient or surrogate, discussions with consultants, evaluation of patient's response to treatment, examination of patient, obtaining history from patient or surrogate, ordering and performing treatments and interventions, ordering and review of laboratory studies, ordering and review of radiographic studies, pulse oximetry, re-evaluation of patient's condition and review of old charts     ____________________________________________   [place] / MDM / [place] / ED COURSE  As part of my medical decision  making, I reviewed the following data within the [place] notes reviewed and incorporated, Labs reviewed , EKG interpreted , Old chart reviewed, Radiograph reviewed , Discussed with admitting physician  and Notes from prior ED visits   Differential diagnosis includes, but is not limited to, CHF exacerbation, COPD (undiagnosed) exacerbation, ACS, PE, COVID-19, pneumonia.  The patient has severe CHF with an EF of reportedly about 6% on her echocardiogram (I reviewed the records) and she reports that she has not been taking her Lasix and was put only on spironolactone.  I believe she is volume overloaded at this time.  Her chest x-ray shows interstitial edema and she has a BNP of 1875.  She was feeling worse  at night and with exertion.  There is no sign of acute infection at this time.  I am ordering furosemide 40 mg IV and will consult the hospitalist for admission and for inpatient cardiology consult.  I suspect she will need some diuresis and careful monitoring given her chronic kidney disease as well.      Clinical Course as of Jan 31 724  Sat Jan 31, 2019  0725 Discussed case by phone with Dr. Posey Pronto with the hospitalist service who will admit.   [CF]    Clinical Course User Index [CF] Hinda Kehr, MD     ____________________________________________  FINAL CLINICAL IMPRESSION(S) / ED DIAGNOSES  Final diagnoses:  Acute on chronic congestive heart failure, unspecified heart failure type (HCC)  Dyspnea, unspecified type  Chronic kidney disease, unspecified CKD stage     MEDICATIONS GIVEN DURING THIS VISIT:  Medications  furosemide (LASIX) injection 40 mg (40 mg Intravenous Given 01/31/19 [id number])     ED Discharge Orders    None      *Please note:  Torrin Urata was evaluated in Emergency Department on 01/31/2019 for the symptoms described in the history of present illness. She was evaluated in the context of the global COVID-19 pandemic, which  necessitated consideration that the patient might be at risk for infection with the SARS-CoV-2 virus that causes COVID-19. Institutional protocols and algorithms that pertain to the evaluation of patients at risk for COVID-19 are in a state of rapid change based on information released by regulatory bodies including the CDC and federal and state organizations. These policies and algorithms were followed during the patient's care in the ED.  Some ED evaluations and interventions may be delayed as a result of limited staffing during the pandemic.*  Note:  This document was prepared using Dragon voice recognition software and may include unintentional dictation errors.   Hinda Kehr, MD 01/31/19 [id number]    Hinda Kehr, MD 02/10/19 [id number]

## 2019-01-31 NOTE — Progress Notes (Signed)
Notify Dr. Posey Pronto, Esmeralda Links about patient's complaints of nausea, asked for PRN medication, order to give 1x dose of IV zofran. Patient also very anxious and is tachypneic asked for anti-anxiety, MD gave order for Morphine 1mg  IV, one time. Asked if we can restart patient's trazodone order for tonight, to help patient sleep, order given. RN will continue to monitor.

## 2019-01-31 NOTE — ED Notes (Signed)
Pt up to commode to have bowel movement.  

## 2019-01-31 NOTE — Consult Note (Signed)
CARDIOLOGY CONSULT NOTE               Patient ID: Katelyn Lane MRN: [id number] DOB/AGE: 64-Apr-1957 64 y.o.  Admit date: 01/31/2019 Referring Physician Dr. Esmeralda Links. Patel Primary Physician Dr. Elyse Jarvis primary Primary Cardiologist Dr. Nehemiah Massed Reason for Consultation shortness of breath  HPI: 64 year old black female history of nonischemic severe cardiomyopathy ejection fraction less than 20% history of noncompliance hyperlipidemia congestive heart failure renal insufficiency tobacco abuse obesity reportedly has not been on ACE inhibitor present with worsening dyspnea shortness of breath over the last few days has been on Eliquis for LV thrombus is not clear whether she still taking it denies any palpitations or tachycardia complains mostly of dyspnea generalized weakness presents for further assessment evaluation  Review of systems complete and found to be negative unless listed above     Past Medical History:  Diagnosis Date  . CHF (congestive heart failure) ([place])   . Hyperlipidemia   . Renal disorder     Past Surgical History:  Procedure Laterality Date  . RIGHT/LEFT HEART CATH AND CORONARY ANGIOGRAPHY N/A 12/30/2018   Procedure: RIGHT/LEFT HEART CATH AND CORONARY ANGIOGRAPHY;  Surgeon: Corey Skains, MD;  Location: [facility];  Service: Cardiovascular;  Laterality: N/A;    Medications Prior to Admission  Medication Sig Dispense Refill Last Dose  . albuterol (VENTOLIN HFA) 108 (90 Base) MCG/ACT inhaler Inhale 2 puffs into the lungs every 6 (six) hours as needed for wheezing or shortness of breath. 8 g 0 Unknown at PRN  . apixaban (ELIQUIS) 5 MG TABS tablet Take 5 mg by mouth 2 (two) times daily.   01/30/2019 at 2000  . atorvastatin (LIPITOR) 20 MG tablet Take 20 mg by mouth daily at 6 PM.    01/30/2019 at Unknown time  . carvedilol (COREG) 3.125 MG tablet Take 1 tablet (3.125 mg total) by mouth 2 (two) times daily with a meal.   01/30/2019 at Unknown time    . FLUoxetine (PROZAC) 40 MG capsule Take 40 mg by mouth daily.    01/30/2019 at Unknown time  . furosemide (LASIX) 40 MG tablet Take 1 tablet (40 mg total) by mouth daily. 30 tablet 0 01/30/2019 at 0800  . loratadine (CLARITIN) 10 MG tablet Take 1 tablet (10 mg total) by mouth daily. (Patient taking differently: Take 10 mg by mouth daily as needed for itching. ) 30 tablet 1 Unknown at PRN  . nitroGLYCERIN (NITROSTAT) 0.4 MG SL tablet Place 1 tablet (0.4 mg total) under the tongue every 5 (five) minutes as needed for chest pain. 30 tablet 1 Unknown at PRN  . spironolactone (ALDACTONE) 25 MG tablet Take 0.5 tablets (12.5 mg total) by mouth daily. 30 tablet 1 01/30/2019 at Unknown time  . traZODone (DESYREL) 100 MG tablet Take 150 mg by mouth at bedtime as needed for sleep.    Unknown at PRN   Social History   Socioeconomic History  . Marital status: Single    Spouse name: Not on file  . Number of children: Not on file  . Years of education: Not on file  . Highest education level: Not on file  Occupational History  . Not on file  Tobacco Use  . Smoking status: Current Every Day Smoker  . Smokeless tobacco: Never Used  Substance and Sexual Activity  . Alcohol use: No    Alcohol/week: 0.0 standard drinks  . Drug use: Yes    Types: Cocaine  . Sexual activity: Not on file  Other Topics Concern  . Not on file  Social History Narrative  . Not on file   Social Determinants of Health   Financial Resource Strain:   . Difficulty of Paying Living Expenses: Not on file  Food Insecurity:   . Worried About Charity fundraiser in the Last Year: Not on file  . Ran Out of Food in the Last Year: Not on file  Transportation Needs:   . Lack of Transportation (Medical): Not on file  . Lack of Transportation (Non-Medical): Not on file  Physical Activity:   . Days of Exercise per Week: Not on file  . Minutes of Exercise per Session: Not on file  Stress:   . Feeling of Stress : Not on file  Social  Connections:   . Frequency of Communication with Friends and Family: Not on file  . Frequency of Social Gatherings with Friends and Family: Not on file  . Attends Religious Services: Not on file  . Active Member of Clubs or Organizations: Not on file  . Attends Archivist Meetings: Not on file  . Marital Status: Not on file  Intimate Partner Violence:   . Fear of Current or Ex-Partner: Not on file  . Emotionally Abused: Not on file  . Physically Abused: Not on file  . Sexually Abused: Not on file    Family History  Problem Relation Age of Onset  . Breast cancer Neg Hx       Review of systems complete and found to be negative unless listed above      PHYSICAL EXAM  General: Well developed, well nourished, in no acute distress HEENT:  Normocephalic and atramatic Neck:  No JVD.  Lungs: Clear bilaterally to auscultation and percussion. Heart: HRRR . Normal S1 and S2 without gallops or murmurs.  Abdomen: Bowel sounds are positive, abdomen soft and non-tender  Msk:  Back normal, normal gait. Normal strength and tone for age. Extremities: No clubbing, cyanosis or edema.   Neuro: Alert and oriented X 3. Psych:  Good affect, responds appropriately  Labs:   Lab Results  Component Value Date   WBC 5.6 01/31/2019   HGB 11.9 (L) 01/31/2019   HCT 38.9 01/31/2019   MCV 85.1 01/31/2019   PLT 340 01/31/2019    Recent Labs  Lab 01/31/19 0501  NA 142  K 3.8  CL 111  CO2 22  BUN 27*  CREATININE 1.99*  CALCIUM 8.9  GLUCOSE 157*   No results found for: CKTOTAL, CKMB, CKMBINDEX, TROPONINI  Lab Results  Component Value Date   CHOL 228 (H) 01/18/2019   Lab Results  Component Value Date   HDL 46 01/18/2019   Lab Results  Component Value Date   LDLCALC 134 (H) 01/18/2019   Lab Results  Component Value Date   TRIG 242 (H) 01/18/2019   Lab Results  Component Value Date   CHOLHDL 5.0 01/18/2019   No results found for: LDLDIRECT    Radiology: DG Chest 2  View  Result Date: 01/15/2019 CLINICAL DATA:  Shortness of breath EXAM: CHEST - 2 VIEW COMPARISON:  Radiograph 01/13/2019 FINDINGS: Mild cardiomegaly with the appearance of left atrial enlargement given a double density sign. There is improving central congestion with mild cephalization of the vascularity and diminished interstitial opacity. No pneumothorax or visible effusion. No focal consolidation. No acute osseous or soft tissue abnormality. Degenerative changes are present in the imaged spine and shoulders. IMPRESSION: Improving features of with only mild residual edema with  stable cardiomegaly. Electronically Signed   By: Lovena Le M.D.   On: 01/15/2019 03:56   MR Brain Wo Contrast (neuro protocol)  Result Date: 01/17/2019 CLINICAL DATA:  Speech abnormality EXAM: MRI HEAD WITHOUT CONTRAST TECHNIQUE: Multiplanar, multiecho pulse sequences of the brain and surrounding structures were obtained without intravenous contrast. COMPARISON:  None. FINDINGS: Brain: There is cortical/subcortical reduced diffusion with involvement of the left precentral and postcentral gyri lateral to the hand motor region and therefore likely involving the face. Scattered small foci of T2 hyperintensity in the supratentorial white matter are nonspecific but may reflect mild chronic microvascular ischemic changes. Small chronic right cerebellar infarct. No evidence of intracranial hemorrhage. There is no intracranial mass, mass effect, or edema. There is no hydrocephalus or extra-axial fluid collection. Vascular: Major vessel flow voids at the skull base are preserved. Skull and upper cervical spine: Normal marrow signal is preserved. Sinuses/Orbits: Paranasal sinuses are aerated. Orbits are unremarkable. Other: Sella is unremarkable.  Mastoid air cells are clear. IMPRESSION: Small cortical/subcortical acute infarction with involvement of left primary sensorimotor cortex. No hemorrhage. Mild chronic microvascular ischemic changes.  Electronically Signed   By: Macy Mis M.D.   On: 01/17/2019 21:30   US Carotid Bilateral (at [facility] and [facility] only)  Result Date: 01/18/2019 CLINICAL DATA:  Stroke. EXAM: BILATERAL CAROTID DUPLEX ULTRASOUND TECHNIQUE: Pearline Cables scale imaging, color Doppler and duplex ultrasound were performed of bilateral carotid and vertebral arteries in the neck. COMPARISON:  None. FINDINGS: Criteria: Quantification of carotid stenosis is based on velocity parameters that correlate the residual internal carotid diameter with NASCET-based stenosis levels, using the diameter of the distal internal carotid lumen as the denominator for stenosis measurement. The following velocity measurements were obtained: RIGHT ICA: 53/28 cm/sec CCA: [id number] cm/sec SYSTOLIC ICA/CCA RATIO:  1.2 ECA: 52 cm/sec LEFT ICA: 45/24 cm/sec CCA: [id number] cm/sec SYSTOLIC ICA/CCA RATIO:  0.9 ECA: 47 cm/sec RIGHT CAROTID ARTERY: Echogenic plaque at the right carotid bulb. External carotid artery is patent with normal waveform. Small amount of echogenic plaque in the proximal internal carotid artery. Normal waveforms and velocities in the internal carotid artery. RIGHT VERTEBRAL ARTERY: Antegrade flow and normal waveform in the right vertebral artery. LEFT CAROTID ARTERY: Small amount of plaque at the left carotid bulb. External carotid artery is patent with normal waveform. Minimal plaque in the proximal internal carotid artery. Normal waveforms and velocities in the internal carotid artery. LEFT VERTEBRAL ARTERY: Antegrade flow and normal waveform in the left vertebral artery. IMPRESSION: Mild atherosclerotic disease in the carotid arteries. Estimated degree of stenosis in the internal carotid arteries is less than 50% bilaterally. Patent vertebral arteries with antegrade flow. Electronically Signed   By: Markus Daft M.D.   On: 01/18/2019 11:37   DG Chest Portable 1 View  Result Date: 01/31/2019 CLINICAL DATA:  Dyspnea EXAM: PORTABLE CHEST 1 VIEW COMPARISON:   01/15/2019 FINDINGS: Cardiomegaly with mild interstitial edema and small right pleural effusion. Left lower lung opacity, favoring a combination of atelectasis and small left pleural effusion, although retrocardiac pneumonia is difficult to exclude. No pneumothorax. IMPRESSION: Cardiomegaly with mild interstitial edema and suspected small bilateral pleural effusions. Left lower lobe opacity, likely atelectasis, pneumonia not excluded. Electronically Signed   By: Julian Hy M.D.   On: 01/31/2019 05:27   DG Chest Port 1 View  Result Date: 01/13/2019 CLINICAL DATA:  Shortness of breath EXAM: PORTABLE CHEST 1 VIEW COMPARISON:  12/29/2018 FINDINGS: Cardiac shadow remains enlarged. Central vascular congestion is noted. Mild edema is noted  similar to that seen on the prior exam. No focal infiltrate or effusion is noted. IMPRESSION: Mild changes of CHF. Electronically Signed   By: Inez Catalina M.D.   On: 01/13/2019 22:44   ECHOCARDIOGRAM COMPLETE  Result Date: 01/19/2019   ECHOCARDIOGRAM REPORT   Patient Name:   Katelyn Lane Date of Exam: 01/19/2019 Medical Rec #:  [id number]     Height:       66.0 in Accession #:    [id number]    Weight:       221.0 lb Date of Birth:  1955-09-26    BSA:          2.09 m Patient Age:    60 years      BP:           112/80 mmHg Patient Gender: F             HR:           62 bpm. Exam Location:  [facility] Procedure: 2D Echo, Color Doppler, Cardiac Doppler and Intracardiac            Opacification Agent Indications:     I63.9 Stroke  History:         Patient has prior history of Echocardiogram examinations, most                  recent 12/29/2018. CHF; Risk Factors:Dyslipidemia.  Sonographer:     Charmayne Sheer RDCS ([facility]) Referring Phys:  [id number] [place] Diagnosing Phys: Neoma Laming MD IMPRESSIONS  1. Left ventricular ejection fraction, by visual estimation, is <20%. The left ventricle has severely decreased function. There is no left ventricular hypertrophy.LVEF 6 PERCENT  2.  Definity contrast agent was given IV to delineate the left ventricular endocardial borders.  3. Large, fixed thrombus on the lateral wall of the left ventricle.  4. Left ventricular diastolic parameters are consistent with Grade III diastolic dysfunction (restrictive).  5. Severely dilated left ventricular internal cavity size.  6. The left ventricle demonstrates global hypokinesis.  7. Global right ventricle has normal systolic function.The right ventricular size is severely enlarged. No increase in right ventricular wall thickness.  8. Left atrial size was severely dilated.  9. Right atrial size was severely dilated. 10. The mitral valve is normal in structure. Trivial mitral valve regurgitation. No evidence of mitral stenosis. 11. The tricuspid valve is normal in structure. 12. The aortic valve is normal in structure. Aortic valve regurgitation is not visualized. No evidence of aortic valve sclerosis or stenosis. 13. The pulmonic valve was normal in structure. Pulmonic valve regurgitation is not visualized. 14. Moderately elevated pulmonary artery systolic pressure. 15. The inferior vena cava is normal in size with greater than 50% respiratory variability, suggesting right atrial pressure of 3 mmHg. FINDINGS  Left Ventricle: Left ventricular ejection fraction, by visual estimation, is <20%. The left ventricle has severely decreased function. Definity contrast agent was given IV to delineate the left ventricular endocardial borders. The left ventricle demonstrates global hypokinesis. The left ventricular internal cavity size was severely dilated left ventricle. There is no left ventricular hypertrophy. Left ventricular diastolic parameters are consistent with Grade III diastolic dysfunction (restrictive). Normal left atrial pressure. There is a large, fixed, lateral left ventricular thrombus. The thrombus appears protruding and regular in shape and solid in texture. Right Ventricle: The right ventricular size is  severely enlarged. No increase in right ventricular wall thickness. Global RV systolic function is has normal systolic function. The tricuspid regurgitant velocity  is 2.85 m/s, and with an assumed right atrial pressure of 10 mmHg, the estimated right ventricular systolic pressure is moderately elevated at 42.5 mmHg. Left Atrium: Left atrial size was severely dilated. Right Atrium: Right atrial size was severely dilated Pericardium: There is no evidence of pericardial effusion. Mitral Valve: The mitral valve is normal in structure. Trivial mitral valve regurgitation. No evidence of mitral valve stenosis by observation. MV peak gradient, 3.9 mmHg. Tricuspid Valve: The tricuspid valve is normal in structure. Tricuspid valve regurgitation is mild. Aortic Valve: The aortic valve is normal in structure. Aortic valve regurgitation is not visualized. The aortic valve is structurally normal, with no evidence of sclerosis or stenosis. Aortic valve mean gradient measures 4.0 mmHg. Aortic valve peak gradient measures 7.3 mmHg. Aortic valve area, by VTI measures 2.03 cm. Pulmonic Valve: The pulmonic valve was normal in structure. Pulmonic valve regurgitation is not visualized. Pulmonic regurgitation is not visualized. Aorta: The aortic root, ascending aorta and aortic arch are all structurally normal, with no evidence of dilitation or obstruction. Venous: The inferior vena cava is normal in size with greater than 50% respiratory variability, suggesting right atrial pressure of 3 mmHg. IAS/Shunts: No atrial level shunt detected by color flow Doppler. There is no evidence of a patent foramen ovale. No ventricular septal defect is seen or detected. There is no evidence of an atrial septal defect.  LEFT VENTRICLE PLAX 2D LVIDd:         6.50 cm  Diastology LVIDs:         6.28 cm  LV e' lateral:   4.90 cm/s LV PW:         1.22 cm  LV E/e' lateral: 19.9 LV IVS:        0.91 cm  LV e' medial:    4.57 cm/s LVOT diam:     2.40 cm  LV E/e'  medial:  21.3 LV SV:         17 ml LV SV Index:   7.55 LVOT Area:     4.52 cm  RIGHT VENTRICLE RV Basal diam:  4.40 cm LEFT ATRIUM           Index       RIGHT ATRIUM           Index LA diam:      4.50 cm 2.16 cm/m  RA Area:     15.40 cm LA Vol (A4C): 92.0 ml 44.09 ml/m RA Volume:   38.90 ml  18.64 ml/m  AORTIC VALVE                    PULMONIC VALVE AV Area (Vmax):    2.45 cm     PV Vmax:       0.58 m/s AV Area (Vmean):   2.15 cm     PV Vmean:      40.800 cm/s AV Area (VTI):     2.03 cm     PV VTI:        0.098 m AV Vmax:           135.00 cm/s  PV Peak grad:  1.3 mmHg AV Vmean:          101.000 cm/s PV Mean grad:  1.0 mmHg AV VTI:            0.252 m AV Peak Grad:      7.3 mmHg AV Mean Grad:      4.0 mmHg LVOT Vmax:  73.20 cm/s LVOT Vmean:        47.900 cm/s LVOT VTI:          0.113 m LVOT/AV VTI ratio: 0.45  AORTA Ao Root diam: 3.20 cm MITRAL VALVE                        TRICUSPID VALVE MV Area (PHT): 4.06 cm             TR Peak grad:   32.5 mmHg MV Peak grad:  3.9 mmHg             TR Vmax:        285.00 cm/s MV Mean grad:  1.0 mmHg MV Vmax:       0.99 m/s             SHUNTS MV Vmean:      48.8 cm/s            Systemic VTI:  0.11 m MV VTI:        0.21 m               Systemic Diam: 2.40 cm MV PHT:        54.23 msec MV Decel Time: 187 msec MV E velocity: 97.50 cm/s 103 cm/s MV A velocity: 80.00 cm/s 70.3 cm/s MV E/A ratio:  1.22       1.5  Neoma Laming MD Electronically signed by Neoma Laming MD Signature Date/Time: 01/19/2019/2:46:46 PM    Final    CT HEAD CODE STROKE WO CONTRAST  Result Date: 01/17/2019 CLINICAL DATA:  Code stroke. EXAM: CT HEAD WITHOUT CONTRAST TECHNIQUE: Contiguous axial images were obtained from the base of the skull through the vertex without intravenous contrast. COMPARISON:  None. FINDINGS: Brain: There is no acute intracranial hemorrhage, mass-effect, or edema. Gray-white differentiation is preserved. There is no extra-axial fluid collection. Ventricles and sulci are within  normal limits in size and configuration. Vascular: No hyperdense vessel.There is atherosclerotic calcification at the skull base. Skull: Calvarium is unremarkable. Sinuses/Orbits: No acute finding. Other: None. ASPECTS [facility] Stroke Program Early CT Score) - Ganglionic level infarction (caudate, lentiform nuclei, internal capsule, insula, M1-M3 cortex): 7 - Supraganglionic infarction (M4-M6 cortex): 3 Total score (0-10 with 10 being normal): 10 IMPRESSION: 1. No acute intracranial hemorrhage or evidence of acute infarction. 2. ASPECTS is 10 These results were called by telephone at the time of interpretation on 01/17/2019 at 7:28 pm to provider [facility] , who verbally acknowledged these results. Electronically Signed   By: Macy Mis M.D.   On: 01/17/2019 19:29    EKG:  normal sinus rhythm nonspecific ST-T wave changes  ASSESSMENT AND PLAN:  Shortness of breath Congestive heart failure Cardiomyopathy Hyperlipidemia LV thrombus Obesity Renal insufficiency stage III CVA Respiratory failure with hypoxemia . Plan Inhalers for COPD type symptoms Supplemental oxygen as necessary for hypoxemia Continue heart failure treatment Consider resuming ACE inhibitor or ARB therapy Continue supportive therapy for respiratory failure and supplemental oxygen Recommend continue hypertension management control Recommend advised to refrain from tobacco abuse Consider avoiding from substance abuse Continue anticoagulation for LV thrombus  Signed: Yolonda Kida MD 01/31/2019, 10:10 PM

## 2019-01-31 NOTE — H&P (Signed)
History and Physical    Katelyn Lane [id number] DOB: 11-21-1955 DOA: 01/31/2019   PCP: [name], MD   Outpatient Specialists: [facility]- Cardiology.  Patient coming from: Home   Chief Complaint: Shortness of breath.  HPI:  Katelyn Lane is a 64 y.o. female with extensive chronic medical history including heart failure with an ejection fraction of well less than 20% who sees Dr. Nehemiah Lane for cardiology.  She presents tonight by EMS for worsening shortness of breath over the last 24 hours.  She says that that is gradually been getting worse and nothing helps.  It got much worse when she was asleep and got up to go to the bathroom and she was extremely winded by the time she tried to get back to bed.  She felt a little bit better after couple of DuoNeb's by EMS but she still feels short of breath and feels like she is wheezing.  She denies fever and says she has been tested multiple times recently for COVID-19 and has always been negative.  She denies sore throat, chest pain, nausea, vomiting, and abdominal pain.  Of note she states that her doctor recently discontinued her Lasix and she is only taking spironolactone. Pt was just admitted for cva . Code status d/w pateitn and she wishes to be FULL CODE .Pt states she did cocaine few weeks ago and cutting down on cigarettes , understands it is bad for her. She wants to be full code today. Pt drinks occasionally.   ED Course:  ED given lasix.  Initial vitals as below: BP 01/31/19 0459 (!) 130/101     Pulse Rate 01/31/19 0459 79     Resp 01/31/19 0459 (!) 24     Temp 01/31/19 0459 (!) 97.5 F (36.4 C)     Temp Source 01/31/19 0459 Oral     SpO2 01/31/19 0459 96 %   She was able to give history and No accessory muscle of resp in use. She was anxious but deneis any chest pain of any other symptoms. Review of Systems: As per HPI otherwise 10 point review of systems negative.   Past Medical History:  Diagnosis Date  .  CHF (congestive heart failure) ([place])   . Hyperlipidemia   . Renal disorder     Past Surgical History:  Procedure Laterality Date  . RIGHT/LEFT HEART CATH AND CORONARY ANGIOGRAPHY N/A 12/30/2018   Procedure: RIGHT/LEFT HEART CATH AND CORONARY ANGIOGRAPHY;  Surgeon: Corey Skains, MD;  Location: [facility];  Service: Cardiovascular;  Laterality: N/A;     reports that she has been smoking. She has never used smokeless tobacco. She reports current drug use. Drug: Cocaine. She reports that she does not drink alcohol.  Allergies  Allergen Reactions  . Penicillins Hives, Rash and Other (See Comments)    Did it involve swelling of the face/tongue/throat, SOB, or low BP? No Did it involve sudden or severe rash/hives, skin peeling, or any reaction on the inside of your mouth or nose? Yes Did you need to seek medical attention at a hospital or doctor's office? Yes When did it last happen? >25 years If all above answers are "NO", may proceed with cephalosporin use.   . Ace Inhibitors Nausea And Vomiting    Family History  Problem Relation Age of Onset  . Breast cancer Neg Hx      Prior to Admission medications   Medication Sig Start Date End Date Taking? Authorizing Provider  albuterol (VENTOLIN HFA) 108 (90  Base) MCG/ACT inhaler Inhale 2 puffs into the lungs every 6 (six) hours as needed for wheezing or shortness of breath. 01/15/19   Rudene Re, MD  Apixaban Starter Pack [facility] DVT/PE STARTER PACK) 5 MG TBPK Take as directed on package: start with two-5mg  tablets twice daily for 7 days. On day 8, switch to one-5mg  tablet twice daily. 01/21/19   Lavina Hamman, MD  atorvastatin (LIPITOR) 20 MG tablet Take 20 mg by mouth daily at 6 PM.     [provider]  carvedilol (COREG) 3.125 MG tablet Take 1 tablet (3.125 mg total) by mouth 2 (two) times daily with a meal. 01/01/19   Nicole Kindred A, DO  FLUoxetine (PROZAC) 40 MG capsule Take 40 mg by mouth daily.      [provider]  furosemide (LASIX) 40 MG tablet Take 1 tablet (40 mg total) by mouth daily. 01/21/19 03/22/19  Lavina Hamman, MD  loratadine (CLARITIN) 10 MG tablet Take 1 tablet (10 mg total) by mouth daily. Patient taking differently: Take 10 mg by mouth daily as needed for itching.  01/02/19   Ezekiel Slocumb, DO  nitroGLYCERIN (NITROSTAT) 0.4 MG SL tablet Place 1 tablet (0.4 mg total) under the tongue every 5 (five) minutes as needed for chest pain. 01/01/19   Ezekiel Slocumb, DO  spironolactone (ALDACTONE) 25 MG tablet Take 0.5 tablets (12.5 mg total) by mouth daily. 01/02/19   Ezekiel Slocumb, DO  traZODone (DESYREL) 100 MG tablet Take 150 mg by mouth at bedtime as needed for sleep.  09/08/13   [provider]    Physical Exam: Vitals:   01/31/19 0600 01/31/19 0615 01/31/19 0742 01/31/19 0743  BP: (!) 128/99   121/88  Pulse: 77 99 (!) 43   Resp: (!) 32 (!) 25 (!) 21   Temp: 97.6 F (36.4 C)     TempSrc: Oral     SpO2: 96% 94% 97%       Constitutional: NAD, calm, comfortable Vitals:   01/31/19 0600 01/31/19 0615 01/31/19 0742 01/31/19 0743  BP: (!) 128/99   121/88  Pulse: 77 99 (!) 43   Resp: (!) 32 (!) 25 (!) 21   Temp: 97.6 F (36.4 C)     TempSrc: Oral     SpO2: 96% 94% 97%    Eyes: PERRL, lids and conjunctivae normal ENMT: Mucous membranes are moist. Posterior pharynx clear of any exudate or lesions.Normal dentition.  Neck: normal, supple, no masses, no thyromegaly Respiratory: clear to auscultation bilaterally, no wheezing, no crackles. Normal respiratory effort. No accessory muscle use.  Cardiovascular: Regular rate and rhythm, no murmurs / rubs / gallops. No extremity edema. 2+ pedal pulses. No carotid bruits.  Abdomen: no tenderness, no masses palpated. No hepatosplenomegaly. Bowel sounds positive.  Musculoskeletal: no clubbing / cyanosis. No joint deformity upper and lower extremities. Good ROM, no contractures. Normal muscle tone.    Skin: no rashes, lesions, ulcers. No induration Neurologic: CN 2-12 grossly intact. Sensation intact, DTR normal. Strength 5/5 in all 4.  Psychiatric: Normal judgment and insight. Alert and oriented x 3. Normal mood.   Labs on Admission: I have personally reviewed following labs and imaging studies  CBC: Recent Labs  Lab 01/31/19 0501  WBC 5.6  NEUTROABS 3.5  HGB 11.8*  HCT 38.8  MCV 86.4  PLT Q000111Q   Basic Metabolic Panel: Recent Labs  Lab 01/31/19 0501  NA 142  K 3.8  CL 111  CO2 22  GLUCOSE 157*  BUN 27*  CREATININE 1.99*  CALCIUM 8.9   GFR: Estimated Creatinine Clearance: 34.6 mL/min (A) (by C-G formula based on SCr of 1.99 mg/dL (H)). Liver Function Tests: No results for input(s): AST, ALT, ALKPHOS, BILITOT, PROT, ALBUMIN in the last 168 hours. No results for input(s): LIPASE, AMYLASE in the last 168 hours. No results for input(s): AMMONIA in the last 168 hours. Coagulation Profile: No results for input(s): INR, PROTIME in the last 168 hours. Cardiac Enzymes: No results for input(s): CKTOTAL, CKMB, CKMBINDEX, TROPONINI in the last 168 hours. BNP (last 3 results) No results for input(s): PROBNP in the last 8760 hours. HbA1C: No results for input(s): HGBA1C in the last 72 hours. CBG: No results for input(s): GLUCAP in the last 168 hours. Lipid Profile: No results for input(s): CHOL, HDL, LDLCALC, TRIG, CHOLHDL, LDLDIRECT in the last 72 hours. Thyroid Function Tests: No results for input(s): TSH, T4TOTAL, FREET4, T3FREE, THYROIDAB in the last 72 hours. Anemia Panel: No results for input(s): VITAMINB12, FOLATE, FERRITIN, TIBC, IRON, RETICCTPCT in the last 72 hours. Urine analysis:    Component Value Date/Time   COLORURINE YELLOW (A) 01/18/2019 1250   APPEARANCEUR HAZY (A) 01/18/2019 1250   LABSPEC 1.018 01/18/2019 1250   PHURINE 5.0 01/18/2019 1250   GLUCOSEU NEGATIVE 01/18/2019 1250   HGBUR NEGATIVE 01/18/2019 1250   BILIRUBINUR NEGATIVE 01/18/2019 1250    KETONESUR NEGATIVE 01/18/2019 1250   PROTEINUR 30 (A) 01/18/2019 1250   NITRITE NEGATIVE 01/18/2019 1250   LEUKOCYTESUR SMALL (A) 01/18/2019 1250   2D Echo 01/2019 IMPRESSIONS:   1. Left ventricular ejection fraction, by visual estimation, is <20%. The left ventricle has severely decreased function. There is no left ventricular hypertrophy.LVEF 6 PERCENT  2. Definity contrast agent was given IV to delineate the left ventricular endocardial borders.  3. Large, fixed thrombus on the lateral wall of the left ventricle.  4. Left ventricular diastolic parameters are consistent with Grade III diastolic dysfunction (restrictive).  5. Severely dilated left ventricular internal cavity size.  6. The left ventricle demonstrates global hypokinesis.  7. Global right ventricle has normal systolic function.The right ventricular size is severely enlarged. No increase in right ventricular wall thickness.  8. Left atrial size was severely dilated.  9. Right atrial size was severely dilated. 10. The mitral valve is normal in structure. Trivial mitral valve regurgitation. No evidence of mitral stenosis. 11. The tricuspid valve is normal in structure. 12. The aortic valve is normal in structure. Aortic valve regurgitation is not visualized. No evidence of aortic valve sclerosis or stenosis. 13. The pulmonic valve was normal in structure. Pulmonic valve regurgitation is not visualized. 14. Moderately elevated pulmonary artery systolic pressure. 15. The inferior vena cava is normal in size with greater than 50% respiratory variability, suggesting right atrial pressure of 3 mmHg.  FINDINGS  Left Ventricle: Left ventricular ejection fraction, by visual estimation, is <20%. The left ventricle has severely decreased function. Definity contrast agent was given IV to delineate the left ventricular endocardial borders. The left ventricle  demonstrates global hypokinesis. The left ventricular internal cavity size  was severely dilated left ventricle. There is no left ventricular hypertrophy. Left ventricular diastolic parameters are consistent with Grade III diastolic dysfunction  (restrictive). Normal left atrial pressure. There is a large, fixed, lateral left ventricular thrombus. The thrombus appears protruding and regular in shape and solid in texture.  Right Ventricle: The right ventricular size is severely enlarged. No increase in right ventricular wall thickness. Global RV systolic function is has  normal systolic function. The tricuspid regurgitant velocity is 2.85 m/s, and with an assumed right  atrial pressure of 10 mmHg, the estimated right ventricular systolic pressure is moderately elevated at 42.5 mmHg.  Left Atrium: Left atrial size was severely dilated.  Right Atrium: Right atrial size was severely dilated  Pericardium: There is no evidence of pericardial effusion.  Mitral Valve: The mitral valve is normal in structure. Trivial mitral valve regurgitation. No evidence of mitral valve stenosis by observation. MV peak gradient, 3.9 mmHg.  Tricuspid Valve: The tricuspid valve is normal in structure. Tricuspid valve regurgitation is mild.  Aortic Valve: The aortic valve is normal in structure. Aortic valve regurgitation is not visualized. The aortic valve is structurally normal, with no evidence of sclerosis or stenosis. Aortic valve mean gradient measures 4.0 mmHg. Aortic valve peak  gradient measures 7.3 mmHg. Aortic valve area, by VTI measures 2.03 cm.  Pulmonic Valve: The pulmonic valve was normal in structure. Pulmonic valve regurgitation is not visualized. Pulmonic regurgitation is not visualized.  Aorta: The aortic root, ascending aorta and aortic arch are all structurally normal, with no evidence of dilitation or obstruction.  Venous: The inferior vena cava is normal in size with greater than 50% respiratory variability, suggesting right atrial pressure of 3  mmHg.  IAS/Shunts: No atrial level shunt detected by color flow Doppler. There is no evidence of a patent foramen ovale. No ventricular septal defect is seen or detected. There is no evidence of an atrial septal defect.  Radiological Exams on Admission: DG Chest Portable 1 View  Result Date: 01/31/2019 CLINICAL DATA:  Dyspnea EXAM: PORTABLE CHEST 1 VIEW COMPARISON:  01/15/2019 FINDINGS: Cardiomegaly with mild interstitial edema and small right pleural effusion. Left lower lung opacity, favoring a combination of atelectasis and small left pleural effusion, although retrocardiac pneumonia is difficult to exclude. No pneumothorax. IMPRESSION: Cardiomegaly with mild interstitial edema and suspected small bilateral pleural effusions. Left lower lobe opacity, likely atelectasis, pneumonia not excluded. Electronically Signed   By: Julian Hy M.D.   On: 01/31/2019 05:27    EKG: Independently reviewed. nsr at 79/lvh.  Assessment/Plan Active Problems:   HFrEF (heart failure with reduced ejection fraction) (HCC)   Acute kidney injury superimposed on CKD ([place])   Stroke (HCC)   Respiratory failure with hypoxia ([place])  HFrEF/dilated cardiomyopathy: Echocardiogram 12/29/2018 showed Q000111Q, grade 3 diastolic dysfunction, global LV hypokinesis. Right/left heart cath 12/31/2018 showed severe dilated cardiomyopathy with EF 15% and minimal atherosclerosis of left circumflex and right coronary.Appears euvolemic and well compensated. -Resume home medications: coreg/ eliquis/  -cont iv lasix and monitor electrolytes.   AKI on CKD stage III: Close to baseline.   Anemia: c-scopy in 2017 and was pos for only polyps. EGD: never.  Hypertension: Resuming blood pressure medications  Hyperlipidemia: atorvastatin 40 mg daily.  Depression: Continue fluoxetine.  Tobacco use: Patient counseled on smoking cessation.  Substance use: Patient counseled on cessation from cocaine use    DVT  prophylaxis: Eliquis. Code Status: Full Family Communication: EC Vicente Serene [phone] ( brother ) Disposition Plan: Home  Consults called: Cardiology Admission status: inpatient.  Para Skeans MD [facility] If 7PM-7AM, please contact night-coverage [email or web address] Password [email or web address] 01/31/2019, 8:21 AM

## 2019-01-31 NOTE — ED Triage Notes (Addendum)
BIB EMS from home CO SOB x1 day - EMS suspect it is r/t anxiety. 94% RA Pt was given duoneb which inc it to 97% RA.   Hx anxiety, CHF and HTN . Takes Laxis PRN; last dose yesterday.

## 2019-01-31 NOTE — ED Notes (Signed)
Bed adjusted for comfort.  

## 2019-01-31 NOTE — ED Notes (Signed)
Pt ambulate to the restroom without assistance.

## 2019-02-01 LAB — BASIC METABOLIC PANEL
Anion gap: 10 (ref 5–15)
BUN: 37 mg/dL — ABNORMAL HIGH (ref 8–23)
CO2: 28 mmol/L (ref 22–32)
Calcium: 9 mg/dL (ref 8.9–10.3)
Chloride: 101 mmol/L (ref 98–111)
Creatinine, Ser: 2.11 mg/dL — ABNORMAL HIGH (ref 0.44–1.00)
GFR calc Af Amer: 28 mL/min — ABNORMAL LOW (ref >60)
GFR calc non Af Amer: 24 mL/min — ABNORMAL LOW (ref >60)
Glucose, Bld: 149 mg/dL — ABNORMAL HIGH (ref 70–99)
Potassium: 4.2 mmol/L (ref 3.5–5.1)
Sodium: 139 mmol/L (ref 135–145)

## 2019-02-01 LAB — CBC
HCT: 39.1 % (ref 36.0–46.0)
Hemoglobin: 11.7 g/dL — ABNORMAL LOW (ref 12.0–15.0)
MCH: 26.1 pg (ref 26.0–34.0)
MCHC: 29.9 g/dL — ABNORMAL LOW (ref 30.0–36.0)
MCV: 87.3 fL (ref 80.0–100.0)
Platelets: 314 10*3/uL (ref 150–400)
RBC: 4.48 MIL/uL (ref 3.87–5.11)
RDW: 15.9 % — ABNORMAL HIGH (ref 11.5–15.5)
WBC: 4.7 10*3/uL (ref 4.0–10.5)
nRBC: 0 % (ref 0.0–0.2)

## 2019-02-01 LAB — MAGNESIUM: Magnesium: 2 mg/dL (ref 1.7–2.4)

## 2019-02-01 MED ORDER — FUROSEMIDE 10 MG/ML IJ SOLN
40.0000 mg | Freq: Two times a day (BID) | INTRAMUSCULAR | Status: DC
  Administered 2019-02-01 – 2019-02-04 (×6): 40 mg via INTRAVENOUS
  Filled 2019-02-01 (×5): qty 4

## 2019-02-01 MED ORDER — ONDANSETRON HCL 4 MG/2ML IJ SOLN
4.0000 mg | Freq: Four times a day (QID) | INTRAMUSCULAR | Status: DC | PRN
  Administered 2019-02-01: 03:00:00 4 mg via INTRAVENOUS
  Filled 2019-02-01: qty 2

## 2019-02-01 NOTE — Progress Notes (Signed)
PROGRESS NOTE    Katelyn Lane  [id number] DOB: June 16, 1955 DOA: 01/31/2019 PCP: [name], MD    Assessment & Plan:   Active Problems:   HFrEF (heart failure with reduced ejection fraction) (HCC)   Acute kidney injury superimposed on CKD ([place])   Stroke ([place])   Respiratory failure with hypoxia ([place])  HFrEF/dilated cardiomyopathy: Echocardiogram 12/29/2018 showed Q000111Q, grade 3 diastolic dysfunction, global LV hypokinesis. Right/left heart cath 12/31/2018 showed severe dilated cardiomyopathy with EF 15% and minimal atherosclerosis of left circumflex and right coronary. -Resume home medications: coreg -cont iv lasix 40 mg BID  LV thrombus --continue home Eliquis  AKI on CKD stage III: Close to baseline.   Anemia: c-scopy in 2017 and was pos for only polyps. EGD: never.  Hypertension: Resuming blood pressure medications  Hyperlipidemia: atorvastatin 40 mg daily.  Depression: Continue fluoxetine.  Tobacco use: Patient counseled on smoking cessation.  Substance use: Patient counseled on cessation from cocaine use   DVT prophylaxis: [id number] Code Status: Full code  Family Communication: not today Disposition Plan: Home in 1-2 days   Subjective and Interval History:  Pt reported breathing better.  Had some problem expressing herself, but generally reported going ok.  No fever, abdominal pain, N/V/D, dysuria.   Objective: Vitals:   01/31/19 1935 02/01/19 0515 02/01/19 0801 02/01/19 1952  BP: 110/81 109/74 119/80 108/82  Pulse: 76 79 (!) 106 73  Resp: 20 20 19 19   Temp: 98.5 F (36.9 C) 98.2 F (36.8 C) (!) 97.4 F (36.3 C) 97.7 F (36.5 C)  TempSrc: Oral Oral Oral Oral  SpO2: 100% 97% 98% 97%  Weight:  98.2 kg    Height:        Intake/Output Summary (Last 24 hours) at 02/01/2019 2009 Last data filed at 02/01/2019 1953 Gross per 24 hour  Intake 360 ml  Output 1250 ml  Net -890 ml   Filed Weights   01/31/19 1501  02/01/19 0515  Weight: 98.4 kg 98.2 kg    Examination:   Constitutional: NAD, AAOx3 HEENT: conjunctivae and lids normal, EOMI CV: RRR. Distal pulses +2.  No cyanosis.   RESP: CTA B/L, normal respiratory effort, on RA  GI: +BS, NTND Extremities: No effusions, edema, or tenderness in BLE SKIN: warm, dry and intact Neuro: II - XII grossly intact.  Sensation intact Psych: Normal mood and affect.     Data Reviewed: I have personally reviewed following labs and imaging studies  CBC: Recent Labs  Lab 01/31/19 0501 01/31/19 0833 02/01/19 1020  WBC 5.6 5.6 4.7  NEUTROABS 3.5  --   --   HGB 11.8* 11.9* 11.7*  HCT 38.8 38.9 39.1  MCV 86.4 85.1 87.3  PLT 313 340 Q000111Q   Basic Metabolic Panel: Recent Labs  Lab 01/31/19 0501 02/01/19 1020  NA 142 139  K 3.8 4.2  CL 111 101  CO2 22 28  GLUCOSE 157* 149*  BUN 27* 37*  CREATININE 1.99* 2.11*  CALCIUM 8.9 9.0  MG  --  2.0   GFR: Estimated Creatinine Clearance: 32.8 mL/min (A) (by C-G formula based on SCr of 2.11 mg/dL (H)). Liver Function Tests: No results for input(s): AST, ALT, ALKPHOS, BILITOT, PROT, ALBUMIN in the last 168 hours. No results for input(s): LIPASE, AMYLASE in the last 168 hours. No results for input(s): AMMONIA in the last 168 hours. Coagulation Profile: No results for input(s): INR, PROTIME in the last 168 hours. Cardiac Enzymes: No results for input(s): CKTOTAL, CKMB, CKMBINDEX, TROPONINI in the  last 168 hours. BNP (last 3 results) No results for input(s): PROBNP in the last 8760 hours. HbA1C: Recent Labs    01/31/19 0833  HGBA1C 5.7*   CBG: No results for input(s): GLUCAP in the last 168 hours. Lipid Profile: No results for input(s): CHOL, HDL, LDLCALC, TRIG, CHOLHDL, LDLDIRECT in the last 72 hours. Thyroid Function Tests: No results for input(s): TSH, T4TOTAL, FREET4, T3FREE, THYROIDAB in the last 72 hours. Anemia Panel: No results for input(s): VITAMINB12, FOLATE, FERRITIN, TIBC, IRON,  RETICCTPCT in the last 72 hours. Sepsis Labs: Recent Labs  Lab 01/31/19 0501  PROCALCITON <0.10    Recent Results (from the past 240 hour(s))  SARS CORONAVIRUS 2 (TAT 6-24 HRS) Nasopharyngeal Nasopharyngeal Swab     Status: None   Collection Time: 01/31/19  6:24 AM   Specimen: Nasopharyngeal Swab  Result Value Ref Range Status   SARS Coronavirus 2 NEGATIVE NEGATIVE Final    Comment: (NOTE) SARS-CoV-2 target nucleic acids are NOT DETECTED. The SARS-CoV-2 RNA is generally detectable in upper and lower respiratory specimens during the acute phase of infection. Negative results do not preclude SARS-CoV-2 infection, do not rule out co-infections with other pathogens, and should not be used as the sole basis for treatment or other patient management decisions. Negative results must be combined with clinical observations, patient history, and epidemiological information. The expected result is Negative. Fact Sheet for Patients: SugarRoll.be Fact Sheet for Healthcare Providers: https://www.woods-mathews.com/ This test is not yet approved or cleared by the [place] FDA and  has been authorized for detection and/or diagnosis of SARS-CoV-2 by FDA under an Emergency Use Authorization (EUA). This EUA will remain  in effect (meaning this test can be used) for the duration of the COVID-19 declaration under Section 56 4(b)(1) of the Act, 21 U.S.C. section 360bbb-3(b)(1), unless the authorization is terminated or revoked sooner. Performed at [facility], [place] [address], [place], [place] [address]       Radiology Studies: DG Chest Portable 1 View  Result Date: 01/31/2019 CLINICAL DATA:  Dyspnea EXAM: PORTABLE CHEST 1 VIEW COMPARISON:  01/15/2019 FINDINGS: Cardiomegaly with mild interstitial edema and small right pleural effusion. Left lower lung opacity, favoring a combination of atelectasis and small left pleural effusion, although  retrocardiac pneumonia is difficult to exclude. No pneumothorax. IMPRESSION: Cardiomegaly with mild interstitial edema and suspected small bilateral pleural effusions. Left lower lobe opacity, likely atelectasis, pneumonia not excluded. Electronically Signed   By: Julian Hy M.D.   On: 01/31/2019 05:27     Scheduled Meds: . apixaban  5 mg Oral BID  . atorvastatin  20 mg Oral q1800  . carvedilol  3.125 mg Oral BID WC  . FLUoxetine  40 mg Oral Daily  . furosemide  40 mg Intravenous BID  . loratadine  10 mg Oral Daily  .  morphine injection  1 mg Intravenous Once  . spironolactone  12.5 mg Oral Daily   Continuous Infusions:   LOS: 1 day     Enzo Bi, MD [facility] If 7PM-7AM, please contact night-coverage 02/01/2019, 8:09 PM

## 2019-02-02 LAB — CBC
HCT: 39.1 % (ref 36.0–46.0)
Hemoglobin: 11.8 g/dL — ABNORMAL LOW (ref 12.0–15.0)
MCH: 25.9 pg — ABNORMAL LOW (ref 26.0–34.0)
MCHC: 30.2 g/dL (ref 30.0–36.0)
MCV: 85.9 fL (ref 80.0–100.0)
Platelets: 318 10*3/uL (ref 150–400)
RBC: 4.55 MIL/uL (ref 3.87–5.11)
RDW: 15.6 % — ABNORMAL HIGH (ref 11.5–15.5)
WBC: 4.6 10*3/uL (ref 4.0–10.5)
nRBC: 0 % (ref 0.0–0.2)

## 2019-02-02 LAB — BASIC METABOLIC PANEL
Anion gap: 12 (ref 5–15)
BUN: 38 mg/dL — ABNORMAL HIGH (ref 8–23)
CO2: 23 mmol/L (ref 22–32)
Calcium: 9.2 mg/dL (ref 8.9–10.3)
Chloride: 103 mmol/L (ref 98–111)
Creatinine, Ser: 2.06 mg/dL — ABNORMAL HIGH (ref 0.44–1.00)
GFR calc Af Amer: 29 mL/min — ABNORMAL LOW (ref >60)
GFR calc non Af Amer: 25 mL/min — ABNORMAL LOW (ref >60)
Glucose, Bld: 121 mg/dL — ABNORMAL HIGH (ref 70–99)
Potassium: 4.2 mmol/L (ref 3.5–5.1)
Sodium: 138 mmol/L (ref 135–145)

## 2019-02-02 LAB — MAGNESIUM: Magnesium: 2.1 mg/dL (ref 1.7–2.4)

## 2019-02-02 NOTE — Progress Notes (Signed)
PROGRESS NOTE    Katelyn Lane  [id number] DOB: June 11, 1955 DOA: 01/31/2019 PCP: [name], MD    Assessment & Plan:   Active Problems:   HFrEF (heart failure with reduced ejection fraction) (HCC)   Acute kidney injury superimposed on CKD ([place])   Stroke ([place])   Respiratory failure with hypoxia ([place])  HFrEF/dilated cardiomyopathy: Echocardiogram 12/29/2018 showed Q000111Q, grade 3 diastolic dysfunction, global LV hypokinesis. Right/left heart cath 12/31/2018 showed severe dilated cardiomyopathy with EF 15% and minimal atherosclerosis of left circumflex and right coronary. 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   Anemia: c-scopy in 2017 and was pos for only polyps. EGD: never.  Hypertension: Continue home blood pressure medications  Hyperlipidemia: atorvastatin 40 mg daily.  Depression: Continue fluoxetine.  Tobacco use: Patient counseled on smoking cessation.  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   Subjective and Interval History:  Pt reported dyspnea only early morning, and she put herself on [place].  No chest pain.  No fever, abdominal pain, N/V/D, dysuria, increased swelling.   Objective: Vitals:   02/02/19 0716 02/02/19 1553 02/02/19 1752 02/02/19 2000  BP: (!) 113/57 104/86  111/80  Pulse: 68 69  68  Resp: 17 16  20   Temp: 97.7 F (36.5 C) (!) 97.5 F (36.4 C)  97.9 F (36.6 C)  TempSrc: Oral Oral  Oral  SpO2: 100% 99% 95% 96%  Weight:      Height:        Intake/Output Summary (Last 24 hours) at 02/02/2019 2202 Last data filed at 02/02/2019 2053 Gross per 24 hour  Intake 1080 ml  Output 2900 ml  Net -1820 ml   Filed Weights   01/31/19 1501 02/01/19 0515  Weight: 98.4 kg 98.2 kg    Examination:   Constitutional: NAD, AAOx3 HEENT: conjunctivae and lids normal, EOMI CV: RRR. Distal pulses +2.  No cyanosis.   RESP: CTA B/L, normal respiratory effort, on RA  GI: +BS, NTND Extremities: No effusions, edema, or tenderness in BLE SKIN: warm, dry and intact Neuro: II - XII grossly intact.  Sensation intact Psych: Normal mood and affect.     Data Reviewed: I have personally reviewed following labs and imaging studies  CBC: Recent Labs  Lab 01/31/19 0501 01/31/19 0833 02/01/19 1020 02/02/19 0534  WBC 5.6 5.6 4.7 4.6  NEUTROABS 3.5  --   --   --   HGB 11.8* 11.9* 11.7* 11.8*  HCT 38.8 38.9 39.1 39.1  MCV 86.4 85.1 87.3 85.9  PLT 313 340 314 0000000   Basic Metabolic Panel: Recent Labs  Lab 01/31/19 0501 02/01/19 1020 02/02/19 0534  NA 142 139 138  K 3.8 4.2 4.2  CL 111 101 103  CO2 22 28 23   GLUCOSE 157* 149* 121*  BUN 27* 37* 38*  CREATININE 1.99* 2.11* 2.06*  CALCIUM 8.9 9.0 9.2  MG  --  2.0 2.1   GFR: Estimated Creatinine Clearance: 33.6 mL/min (A) (by C-G formula based on SCr of 2.06 mg/dL (H)). Liver Function Tests: No results for input(s): AST, ALT, ALKPHOS, BILITOT, PROT, ALBUMIN in the last 168 hours. No results for input(s): LIPASE, AMYLASE in the last 168 hours. No results for input(s): AMMONIA in the last 168  hours. Coagulation Profile: No results for input(s): INR, PROTIME in the last 168 hours. Cardiac Enzymes: No results for input(s): CKTOTAL, CKMB, CKMBINDEX, TROPONINI in the last 168 hours. BNP (last 3 results) No results for input(s): PROBNP in the last 8760 hours. HbA1C: Recent Labs    01/31/19 0833  HGBA1C 5.7*   CBG: No results for input(s): GLUCAP in the last 168 hours. Lipid Profile: No results for input(s): CHOL, HDL, LDLCALC, TRIG, CHOLHDL, LDLDIRECT in the last 72 hours. Thyroid Function Tests: No results for input(s): TSH, T4TOTAL, FREET4, T3FREE, THYROIDAB  in the last 72 hours. Anemia Panel: No results for input(s): VITAMINB12, FOLATE, FERRITIN, TIBC, IRON, RETICCTPCT in the last 72 hours. Sepsis Labs: Recent Labs  Lab 01/31/19 0501  PROCALCITON <0.10    Recent Results (from the past 240 hour(s))  SARS CORONAVIRUS 2 (TAT 6-24 HRS) Nasopharyngeal Nasopharyngeal Swab     Status: None   Collection Time: 01/31/19  6:24 AM   Specimen: Nasopharyngeal Swab  Result Value Ref Range Status   SARS Coronavirus 2 NEGATIVE NEGATIVE Final    Comment: (NOTE) SARS-CoV-2 target nucleic acids are NOT DETECTED. The SARS-CoV-2 RNA is generally detectable in upper and lower respiratory specimens during the acute phase of infection. Negative results do not preclude SARS-CoV-2 infection, do not rule out co-infections with other pathogens, and should not be used as the sole basis for treatment or other patient management decisions. Negative results must be combined with clinical observations, patient history, and epidemiological information. The expected result is Negative. Fact Sheet for Patients: SugarRoll.be Fact Sheet for Healthcare Providers: https://www.woods-mathews.com/ This test is not yet approved or cleared by the [place] FDA and  has been authorized for detection and/or diagnosis of SARS-CoV-2 by FDA under an Emergency Use Authorization (EUA). This EUA will remain  in effect (meaning this test can be used) for the duration of the COVID-19 declaration under Section 56 4(b)(1) of the Act, 21 U.S.C. section 360bbb-3(b)(1), unless the authorization is terminated or revoked sooner. Performed at [facility], [place] [address], [place], [place] [address]       Radiology Studies: No results found.   Scheduled Meds: . apixaban  5 mg Oral BID  . atorvastatin  20 mg Oral q1800  . carvedilol  3.125 mg Oral BID WC  . FLUoxetine  40 mg Oral Daily  . furosemide  40 mg Intravenous BID  .  loratadine  10 mg Oral Daily  .  morphine injection  1 mg Intravenous Once  . spironolactone  12.5 mg Oral Daily   Continuous Infusions:   LOS: 2 days     Enzo Bi, MD [facility] If 7PM-7AM, please contact night-coverage 02/02/2019, 10:02 PM

## 2019-02-02 NOTE — TOC Initial Note (Signed)
Transition of Care [facility]) - Initial/Assessment Note    Patient Details  Name: Rosanna Bickle MRN: [id number] Date of Birth: 1955-01-10  Transition of Care [facility]) CM/SW Contact:    Victorino Dike, RN Phone Number: 02/02/2019, 10:55 AM  Clinical Narrative:                  Met with patient for Heart Failure screen and Case Management Assessment.  Patient lives at home alone. She does not use any DME at this time.  She does not wear oxygen, although on 2LNC acute oxygen at this time.  Patient reports having blood pressure cuff although she does not check her blood pressure regularly, we discussed checking once a day and keeping a log.  Patient will be provided a scale from [facility] team.  Discussions on weighing self daily and reporting wait gain of  2 lbs overnight or 5 lbs weekly to her doctor for follow up.  Will continue to follow for further needs.    Expected Discharge Plan: Home/Self Care Barriers to Discharge: Continued Medical Work up   Patient Goals and CMS Choice        Expected Discharge Plan and Services Expected Discharge Plan: Home/Self Care   Discharge Planning Services: CM Consult                                          Prior Living Arrangements/Services   Lives with:: Self Patient language and need for interpreter reviewed:: Yes Do you feel safe going back to the place where you live?: No      Need for Family Participation in Patient Care: No (Comment) Care giver support system in place?: Yes (comment)   Criminal Activity/Legal Involvement Pertinent to Current Situation/Hospitalization: No - Comment as needed  Activities of Daily Living Home Assistive Devices/Equipment: Dentures (specify type) ADL Screening (condition at time of admission) Patient's cognitive ability adequate to safely complete daily activities?: Yes Is the patient deaf or have difficulty hearing?: No Does the patient have difficulty seeing, even when wearing glasses/contacts?:  No Does the patient have difficulty concentrating, remembering, or making decisions?: No Patient able to express need for assistance with ADLs?: Yes Does the patient have difficulty dressing or bathing?: No Independently performs ADLs?: Yes (appropriate for developmental age) Does the patient have difficulty walking or climbing stairs?: No Weakness of Legs: None Weakness of Arms/Hands: None  Permission Sought/Granted                  Emotional Assessment Appearance:: Appears stated age Attitude/Demeanor/Rapport: Engaged Affect (typically observed): Appropriate Orientation: : Oriented to Self, Oriented to Place, Oriented to  Time, Oriented to Situation Alcohol / Substance Use: Not Applicable Psych Involvement: No (comment)  Admission diagnosis:  Respiratory failure with hypoxia (HCC) [J96.91] Dyspnea, unspecified type [R06.00] Chronic kidney disease, unspecified CKD stage [N18.9] Acute on chronic congestive heart failure, unspecified heart failure type [facility]) [I50.9] Patient Active Problem List   Diagnosis Date Noted  . Respiratory failure with hypoxia ([place]) 01/31/2019  . Stroke ([place]) 01/18/2019  . Dilated cardiomyopathy ([place]) 12/31/2018  . Chest tightness 12/31/2018  . Elevated troponin 12/31/2018  . Hypertensive urgency 12/31/2018  . Acute kidney injury superimposed on CKD ([place]) 12/31/2018  . Dyslipidemia 12/31/2018  . Depression 12/31/2018  . HFrEF (heart failure with reduced ejection fraction) ([place]) 12/29/2018   PCP:  [name], MD Pharmacy:  [facility] [address] [place], [place] - [place] [address] Phone: [phone] Fax: [phone]  [id number] [place], [place] [address] Phone: [phone] Fax: [phone]     Social Determinants of Health (SDOH) Interventions    Readmission Risk Interventions No flowsheet  data found.

## 2019-02-02 NOTE — Progress Notes (Signed)
Pt accidentally pulled out new IV site that had been placed this a.m. and refusing to have another restarted. Pt states "my Lasix isn't due until tonight, they can do it then." MD made aware. Will continue plan of care.

## 2019-02-03 LAB — CBC
HCT: 40.4 % (ref 36.0–46.0)
Hemoglobin: 12.4 g/dL (ref 12.0–15.0)
MCH: 26.2 pg (ref 26.0–34.0)
MCHC: 30.7 g/dL (ref 30.0–36.0)
MCV: 85.4 fL (ref 80.0–100.0)
Platelets: 349 10*3/uL (ref 150–400)
RBC: 4.73 MIL/uL (ref 3.87–5.11)
RDW: 15.3 % (ref 11.5–15.5)
WBC: 4.1 10*3/uL (ref 4.0–10.5)
nRBC: 0 % (ref 0.0–0.2)

## 2019-02-03 LAB — BASIC METABOLIC PANEL
Anion gap: 8 (ref 5–15)
BUN: 43 mg/dL — ABNORMAL HIGH (ref 8–23)
CO2: 30 mmol/L (ref 22–32)
Calcium: 9.3 mg/dL (ref 8.9–10.3)
Chloride: 98 mmol/L (ref 98–111)
Creatinine, Ser: 1.97 mg/dL — ABNORMAL HIGH (ref 0.44–1.00)
GFR calc Af Amer: 31 mL/min — ABNORMAL LOW (ref >60)
GFR calc non Af Amer: 26 mL/min — ABNORMAL LOW (ref >60)
Glucose, Bld: 121 mg/dL — ABNORMAL HIGH (ref 70–99)
Potassium: 4.3 mmol/L (ref 3.5–5.1)
Sodium: 136 mmol/L (ref 135–145)

## 2019-02-03 LAB — MAGNESIUM: Magnesium: 2.3 mg/dL (ref 1.7–2.4)

## 2019-02-03 NOTE — Progress Notes (Signed)
CHF education provided to patient: -daily weights (pt needs a scale--CM to provide) and when to call MD regarding weight gain -S&S to report to MD (knowing her zones) -dietary restrictions (salt) -fluid restrictions (1.5-2 L/day) -increasing activity as tolerated -diruetics (pt denied any difficulty affording or obtaining medications) -follow-up appointments with HF clinic (pt denies any transportation barriers)  Pt education booklet provided with zones magnet.  EMMI videos offered, but declined by pt.  All of pt's questions addressed. Understanding validated through teach back.

## 2019-02-03 NOTE — Progress Notes (Signed)
PROGRESS NOTE    Katelyn Lane  [id number] DOB: Mar 12, 1955 DOA: 01/31/2019 PCP: [name], MD    Assessment & Plan:   Active Problems:   HFrEF (heart failure with reduced ejection fraction) (HCC)   Acute kidney injury superimposed on CKD (HCC)   Stroke (HCC)   Respiratory failure with hypoxia (HCC)  Acute on chronic systolic CHF exacerbation Hx of dilated cardiomyopathy: Echocardiogram 12/29/2018 showed Q000111Q, grade 3 diastolic dysfunction, global LV hypokinesis. Right/left heart cath 12/31/2018 showed severe dilated cardiomyopathy with EF 15% and minimal atherosclerosis of left circumflex and right coronary. --Pt presented with dyspnea, BNP 1875.  With IV lasix diuresis, pt has had great urine output, improved dyspnea, and Cr stable and actually trended down a little with diuresis suggesting pt has extra volume to give. 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 Cr improving with diuresis.  Anemia: c-scopy in 2017 and was pos for only polyps. EGD: never.  Hypertension: Continue home blood pressure medications  Hyperlipidemia: atorvastatin 40 mg daily.  Depression: Continue fluoxetine.  Tobacco use: Patient counseled on smoking cessation.  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  Reported breathing felt a lot better.  No fever, chest pain, abdominal pain, N/V/D, dysuria, increased swelling.   Objective: Vitals:   02/03/19 0325 02/03/19 0746 02/03/19 1607 02/03/19 1931  BP: 127/83 111/76 (!) 119/95 112/75  Pulse: 70  67 73 68  Resp: 18 18 18 20   Temp: 97.7 F (36.5 C) (!) 97.4 F (36.3 C) 98.1 F (36.7 C) 97.8 F (36.6 C)  TempSrc: Oral Oral Oral Oral  SpO2: 100% 98% 99% 96%  Weight: 98.7 kg     Height:        Intake/Output Summary (Last 24 hours) at 02/03/2019 2023 Last data filed at 02/03/2019 1931 Gross per 24 hour  Intake 1080 ml  Output 4100 ml  Net -3020 ml   Filed Weights   01/31/19 1501 02/01/19 0515 02/03/19 0325  Weight: 98.4 kg 98.2 kg 98.7 kg    Examination:   Constitutional: NAD, AAOx3 HEENT: conjunctivae and lids normal, EOMI CV: RRR. Distal pulses +2.  No cyanosis.   RESP: CTA B/L, normal respiratory effort, on RA  GI: +BS, NTND Extremities: No effusions, edema, or tenderness in BLE SKIN: warm, dry and intact Neuro: II - XII grossly intact.  Sensation intact Psych: Normal mood and affect.     Data Reviewed: I have personally reviewed following labs and imaging studies  CBC: Recent Labs  Lab 01/31/19 0501 01/31/19 0833 02/01/19 1020 02/02/19 0534 02/03/19 0633  WBC 5.6 5.6 4.7 4.6 4.1  NEUTROABS 3.5  --   --   --   --   HGB 11.8* 11.9* 11.7* 11.8* 12.4  HCT 38.8 38.9 39.1 39.1 40.4  MCV 86.4 85.1 87.3 85.9 85.4  PLT 313 340 314 318 0000000   Basic Metabolic Panel: Recent Labs  Lab 01/31/19 0501 02/01/19 1020 02/02/19 0534 02/03/19 0633  NA 142 139 138 136  K 3.8 4.2 4.2 4.3  CL 111 101 103 98  CO2 22 28  23 30  GLUCOSE 157* 149* 121* 121*  BUN 27* 37* 38* 43*  CREATININE 1.99* 2.11* 2.06* 1.97*  CALCIUM 8.9 9.0 9.2 9.3  MG  --  2.0 2.1 2.3   GFR: Estimated Creatinine Clearance: 35.3 mL/min (A) (by C-G formula based on SCr of 1.97 mg/dL (H)). Liver Function Tests: No results for input(s): AST, ALT, ALKPHOS, BILITOT, PROT, ALBUMIN in the last 168 hours. No results for input(s): LIPASE, AMYLASE in the last 168 hours. No results for input(s): AMMONIA in the last 168 hours. Coagulation Profile: No results for input(s): INR, PROTIME in the last 168  hours. Cardiac Enzymes: No results for input(s): CKTOTAL, CKMB, CKMBINDEX, TROPONINI in the last 168 hours. BNP (last 3 results) No results for input(s): PROBNP in the last 8760 hours. HbA1C: No results for input(s): HGBA1C in the last 72 hours. CBG: No results for input(s): GLUCAP in the last 168 hours. Lipid Profile: No results for input(s): CHOL, HDL, LDLCALC, TRIG, CHOLHDL, LDLDIRECT in the last 72 hours. Thyroid Function Tests: No results for input(s): TSH, T4TOTAL, FREET4, T3FREE, THYROIDAB in the last 72 hours. Anemia Panel: No results for input(s): VITAMINB12, FOLATE, FERRITIN, TIBC, IRON, RETICCTPCT in the last 72 hours. Sepsis Labs: Recent Labs  Lab 01/31/19 0501  PROCALCITON <0.10    Recent Results (from the past 240 hour(s))  SARS CORONAVIRUS 2 (TAT 6-24 HRS) Nasopharyngeal Nasopharyngeal Swab     Status: None   Collection Time: 01/31/19  6:24 AM   Specimen: Nasopharyngeal Swab  Result Value Ref Range Status   SARS Coronavirus 2 NEGATIVE NEGATIVE Final    Comment: (NOTE) SARS-CoV-2 target nucleic acids are NOT DETECTED. The SARS-CoV-2 RNA is generally detectable in upper and lower respiratory specimens during the acute phase of infection. Negative results do not preclude SARS-CoV-2 infection, do not rule out co-infections with other pathogens, and should not be used as the sole basis for treatment or other patient management decisions. Negative results must be combined with clinical observations, patient history, and epidemiological information. The expected result is Negative. Fact Sheet for Patients: SugarRoll.be Fact Sheet for Healthcare Providers: https://www.woods-mathews.com/ This test is not yet approved or cleared by the [place] FDA and  has been authorized for detection and/or diagnosis of SARS-CoV-2 by FDA under an Emergency Use Authorization (EUA). This EUA will remain  in effect (meaning this test can  be used) for the duration of the COVID-19 declaration under Section 56 4(b)(1) of the Act, 21 U.S.C. section 360bbb-3(b)(1), unless the authorization is terminated or revoked sooner. Performed at [facility], [place] [address], [place], [place] [address]       Radiology Studies: No results found.   Scheduled Meds: . apixaban  5 mg Oral BID  . atorvastatin  20 mg Oral q1800  . carvedilol  3.125 mg Oral BID WC  . FLUoxetine  40 mg Oral Daily  . furosemide  40 mg Intravenous BID  . loratadine  10 mg Oral Daily  .  morphine injection  1 mg Intravenous Once  . spironolactone  12.5 mg Oral Daily   Continuous Infusions:   LOS: 3 days     Enzo Bi, MD [facility] If 7PM-7AM, please contact night-coverage 02/03/2019, 8:23 PM

## 2019-02-04 DIAGNOSIS — N189 Chronic kidney disease, unspecified: Secondary | ICD-10-CM

## 2019-02-04 DIAGNOSIS — I639 Cerebral infarction, unspecified: Secondary | ICD-10-CM

## 2019-02-04 DIAGNOSIS — N179 Acute kidney failure, unspecified: Secondary | ICD-10-CM

## 2019-02-04 MED ORDER — FUROSEMIDE 40 MG PO TABS
40.0000 mg | ORAL_TABLET | Freq: Every day | ORAL | Status: DC
  Administered 2019-02-04: 40 mg via ORAL
  Filled 2019-02-04: qty 1

## 2019-02-04 NOTE — Plan of Care (Signed)

## 2019-02-04 NOTE — Discharge Summary (Signed)
Physician Discharge Summary  Katelyn Lane [id number] DOB: November 14, 1955 DOA: 01/31/2019  PCP: [name], MD  Admit date: 01/31/2019 Discharge date: 02/04/2019  Admitted From: Home Disposition: Home  Recommendations for Outpatient Follow-up:  1. Follow up with PCP in 1-2 weeks   Home Health: No Equipment/Devices: None Discharge Condition: Stable CODE STATUS: Full Diet recommendation: Heart Healthy  Brief/Interim Summary:  Katelyn Lane a 64 y.o.femalewith extensive chronic medical history including heart failure with an ejection fraction of well less than 20% who sees Dr. Nehemiah Massed for cardiology. She presents tonight by EMS for worsening shortness of breath over the last 24 hours.She says that that is gradually been getting worse and nothing helps. It got much worse when she was asleep and got up to go to the bathroom and she was extremely winded by the time she tried to get back to bed. She felt a little bit better after couple of DuoNeb's by EMS but she still feels short of breath and feels like she is wheezing. She denies fever and says she has been tested multiple times recently for COVID-19 and has always been negative. She denies sore throat, chest pain, nausea, vomiting, and abdominal pain. Of note she states that her doctor recently discontinued her Lasix and she is only taking spironolactone. Pt was just admitted for cva . Code status d/w pateitn and she wishes to be FULL CODE .Pt states she did cocaine few weeks ago and cutting down on cigarettes , understands it is bad for her. She wants to be full code today. Pt drinks occasionally.   Patient was effectively diuresed in house.  Net negative oxygen 2 to 1/2 L daily on IV Lasix 40 mg twice daily.  Breathing improved.  No fevers chest pain abdominal pain.  No nausea vomiting or diarrhea.  No dysuria or increased swelling.  Repeat echo shows significantly reduced ejection fraction with grade 3 diastolic  dysfunction.  Patient with recurrence of severe dilated cardiomyopathy with minimal atherosclerotic disease left circumflex and right main coronary arteries.  Once euvolemia achieved patient was transitioned to oral diuretic and discharged home  Discharge Diagnoses:  Active Problems:   HFrEF (heart failure with reduced ejection fraction) (HCC)   Acute kidney injury superimposed on CKD (HCC)   Stroke (HCC)   Respiratory failure with hypoxia (HCC)  Acute on chronic systolic CHF exacerbation Hx of dilated cardiomyopathy: Echocardiogram 12/29/2018 showed [id number], grade 3 diastolic dysfunction, global LV hypokinesis. Right/left heart cath 12/31/2018 showed severe dilated cardiomyopathy with EF 15% and minimal atherosclerosis of left circumflex and right coronary. --Pt presented with dyspnea, BNP 1875.  With IV lasix diuresis, pt has had great urine output, improved dyspnea, and Cr stable and actually trended down a little with diuresis suggesting pt has extra volume to give. --Patient diuresed effectively, euvolemia achieved --Transition back to p.o. 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  Anemia: c-scopy in 2017 and was pos for only polyps. EGD: never.  Hypertension: Continue home blood pressure medications  Hyperlipidemia: atorvastatin 40 mg daily.  Depression: Continue fluoxetine.  Tobacco use: Patient counseled on smoking cessation.  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 No Did it involve  sudden or severe rash/hives, skin peeling, or any reaction on the inside of your mouth or nose? Yes Did you need to seek medical attention at a hospital or doctor's office? Yes When did it last happen? >25 years If all above answers are "NO", may proceed with cephalosporin use.   Ace Inhibitors Nausea And Vomiting      Medication List    TAKE these medications   albuterol 108 (90 Base) MCG/ACT inhaler Commonly known as: VENTOLIN HFA Inhale 2 puffs into the lungs every 6 (six) hours as needed for wheezing or shortness of breath.   atorvastatin 20 MG tablet Commonly known as: LIPITOR Take 20 mg by mouth daily at 6 PM.   carvedilol 3.125 MG tablet Commonly known as: COREG Take 1 tablet (3.125 mg total) by mouth 2 (two) times daily with a meal.   Eliquis 5 MG Tabs tablet Generic drug: apixaban Take 5 mg by mouth 2 (two) times daily.   FLUoxetine 40 MG capsule Commonly known as: PROZAC Take 40 mg by mouth daily.   furosemide 40 MG tablet Commonly known as: Lasix Take 1 tablet (40 mg total) by mouth daily.   loratadine 10 MG tablet Commonly known as: CLARITIN Take 1 tablet (10 mg total) by mouth daily. What changed:   when to take this  reasons to take this   nitroGLYCERIN 0.4 MG SL tablet Commonly known as: NITROSTAT Place 1 tablet (0.4 mg total) under the tongue every 5 (five) minutes as needed for chest pain.   spironolactone 25 MG tablet Commonly known as: ALDACTONE Take 0.5 tablets (12.5 mg total) by mouth daily.   traZODone 100 MG tablet Commonly known as: DESYREL Take 150 mg by mouth at bedtime as needed for sleep.      Follow-up Information    Corey Skains, MD. Go on 02/27/2019.   Specialty: Cardiology Why: appointment at 11:45am Contact information: [facility] [place] [address] [phone]          Allergies  Allergen Reactions  . Penicillins Hives, Rash and Other (See Comments)     Did it involve swelling of the face/tongue/throat, SOB, or low BP? No Did it involve sudden or severe rash/hives, skin peeling, or any reaction on the inside of your mouth or nose? Yes Did you need to seek medical attention at a hospital or doctor's office? Yes When did it last happen? >25 years If all above answers are "NO", may proceed with cephalosporin use.   Humberto Leep Inhibitors Nausea And Vomiting    Consultations:  Cardiology-[facility]   Procedures/Studies: DG Chest 2 View  Result Date: 01/15/2019 CLINICAL DATA:  Shortness of breath EXAM: CHEST - 2 VIEW COMPARISON:  Radiograph 01/13/2019 FINDINGS: Mild cardiomegaly with the appearance of left atrial enlargement given a double density sign. There is improving central congestion with mild cephalization of the vascularity and diminished interstitial opacity. No pneumothorax or visible effusion. No focal consolidation. No acute osseous or soft tissue abnormality. Degenerative changes are present in the imaged spine and shoulders. IMPRESSION: Improving features of with only mild residual edema with stable cardiomegaly. Electronically Signed   By: Lovena Le M.D.   On: 01/15/2019 03:56   MR Brain Wo Contrast (neuro protocol)  Result Date: 01/17/2019 CLINICAL DATA:  Speech abnormality EXAM: MRI HEAD WITHOUT CONTRAST  TECHNIQUE: Multiplanar, multiecho pulse sequences of the brain and surrounding structures were obtained without intravenous contrast. COMPARISON:  None. FINDINGS: Brain: There is cortical/subcortical reduced diffusion with involvement of the left precentral and postcentral gyri lateral to the hand motor region and therefore likely involving the face. Scattered small foci of T2 hyperintensity in the supratentorial white matter are nonspecific but may reflect mild chronic microvascular ischemic changes. Small chronic right cerebellar infarct. No evidence of intracranial hemorrhage. There is no intracranial mass, mass effect, or  edema. There is no hydrocephalus or extra-axial fluid collection. Vascular: Major vessel flow voids at the skull base are preserved. Skull and upper cervical spine: Normal marrow signal is preserved. Sinuses/Orbits: Paranasal sinuses are aerated. Orbits are unremarkable. Other: Sella is unremarkable.  Mastoid air cells are clear. IMPRESSION: Small cortical/subcortical acute infarction with involvement of left primary sensorimotor cortex. No hemorrhage. Mild chronic microvascular ischemic changes. Electronically Signed   By: Macy Mis M.D.   On: 01/17/2019 21:30   US Carotid Bilateral (at [facility] and [facility] only)  Result Date: 01/18/2019 CLINICAL DATA:  Stroke. EXAM: BILATERAL CAROTID DUPLEX ULTRASOUND TECHNIQUE: Pearline Cables scale imaging, color Doppler and duplex ultrasound were performed of bilateral carotid and vertebral arteries in the neck. COMPARISON:  None. FINDINGS: Criteria: Quantification of carotid stenosis is based on velocity parameters that correlate the residual internal carotid diameter with NASCET-based stenosis levels, using the diameter of the distal internal carotid lumen as the denominator for stenosis measurement. The following velocity measurements were obtained: RIGHT ICA: 53/28 cm/sec CCA: [id number] cm/sec SYSTOLIC ICA/CCA RATIO:  1.2 ECA: 52 cm/sec LEFT ICA: 45/24 cm/sec CCA: [id number] cm/sec SYSTOLIC ICA/CCA RATIO:  0.9 ECA: 47 cm/sec RIGHT CAROTID ARTERY: Echogenic plaque at the right carotid bulb. External carotid artery is patent with normal waveform. Small amount of echogenic plaque in the proximal internal carotid artery. Normal waveforms and velocities in the internal carotid artery. RIGHT VERTEBRAL ARTERY: Antegrade flow and normal waveform in the right vertebral artery. LEFT CAROTID ARTERY: Small amount of plaque at the left carotid bulb. External carotid artery is patent with normal waveform. Minimal plaque in the proximal internal carotid artery. Normal waveforms and velocities in the internal  carotid artery. LEFT VERTEBRAL ARTERY: Antegrade flow and normal waveform in the left vertebral artery. IMPRESSION: Mild atherosclerotic disease in the carotid arteries. Estimated degree of stenosis in the internal carotid arteries is less than 50% bilaterally. Patent vertebral arteries with antegrade flow. Electronically Signed   By: Markus Daft M.D.   On: 01/18/2019 11:37   DG Chest Portable 1 View  Result Date: 01/31/2019 CLINICAL DATA:  Dyspnea EXAM: PORTABLE CHEST 1 VIEW COMPARISON:  01/15/2019 FINDINGS: Cardiomegaly with mild interstitial edema and small right pleural effusion. Left lower lung opacity, favoring a combination of atelectasis and small left pleural effusion, although retrocardiac pneumonia is difficult to exclude. No pneumothorax. IMPRESSION: Cardiomegaly with mild interstitial edema and suspected small bilateral pleural effusions. Left lower lobe opacity, likely atelectasis, pneumonia not excluded. Electronically Signed   By: Julian Hy M.D.   On: 01/31/2019 05:27   DG Chest Port 1 View  Result Date: 01/13/2019 CLINICAL DATA:  Shortness of breath EXAM: PORTABLE CHEST 1 VIEW COMPARISON:  12/29/2018 FINDINGS: Cardiac shadow remains enlarged. Central vascular congestion is noted. Mild edema is noted similar to that seen on the prior exam. No focal infiltrate or effusion is noted. IMPRESSION: Mild changes of CHF. Electronically Signed   By: Inez Catalina M.D.   On: 01/13/2019 22:44   ECHOCARDIOGRAM COMPLETE  Result Date: 01/19/2019   ECHOCARDIOGRAM REPORT   Patient Name:   Katelyn Lane Date of Exam: 01/19/2019 Medical Rec #:  [id number]     Height:       66.0 in Accession #:    [id number]    Weight:       221.0 lb Date of Birth:  [birth year]    BSA:          2.09 m Patient Age:    47 years      BP:           112/80 mmHg Patient Gender: F             HR:           62 bpm. Exam Location:  [facility] Procedure: 2D Echo, Color Doppler, Cardiac Doppler and Intracardiac            Opacification  Agent Indications:     I63.9 Stroke  History:         Patient has prior history of Echocardiogram examinations, most                  recent 12/29/2018. CHF; Risk Factors:Dyslipidemia.  Sonographer:     Charmayne Sheer RDCS (AE) Referring Phys:  [id number] [place] Diagnosing Phys: Neoma Laming MD IMPRESSIONS  1. Left ventricular ejection fraction, by visual estimation, is <20%. The left ventricle has severely decreased function. There is no left ventricular hypertrophy.LVEF 6 PERCENT  2. Definity contrast agent was given IV to delineate the left ventricular endocardial borders.  3. Large, fixed thrombus on the lateral wall of the left ventricle.  4. Left ventricular diastolic parameters are consistent with Grade III diastolic dysfunction (restrictive).  5. Severely dilated left ventricular internal cavity size.  6. The left ventricle demonstrates global hypokinesis.  7. Global right ventricle has normal systolic function.The right ventricular size is severely enlarged. No increase in right ventricular wall thickness.  8. Left atrial size was severely dilated.  9. Right atrial size was severely dilated. 10. The mitral valve is normal in structure. Trivial mitral valve regurgitation. No evidence of mitral stenosis. 11. The tricuspid valve is normal in structure. 12. The aortic valve is normal in structure. Aortic valve regurgitation is not visualized. No evidence of aortic valve sclerosis or stenosis. 13. The pulmonic valve was normal in structure. Pulmonic valve regurgitation is not visualized. 14. Moderately elevated pulmonary artery systolic pressure. 15. The inferior vena cava is normal in size with greater than 50% respiratory variability, suggesting right atrial pressure of 3 mmHg. FINDINGS  Left Ventricle: Left ventricular ejection fraction, by visual estimation, is <20%. The left ventricle has severely decreased function. Definity contrast agent was given IV to delineate the left ventricular endocardial  borders. The left ventricle demonstrates global hypokinesis. The left ventricular internal cavity size was severely dilated left ventricle. There is no left ventricular hypertrophy. Left ventricular diastolic parameters are consistent with Grade III diastolic dysfunction (restrictive). Normal left atrial pressure. There is a large, fixed, lateral left ventricular thrombus. The thrombus appears protruding and regular in shape and solid in texture. Right Ventricle: The right ventricular size is severely enlarged. No increase in right ventricular wall thickness. Global RV systolic function is has normal systolic function. The tricuspid regurgitant velocity is 2.85 m/s, and with an assumed right atrial pressure of 10 mmHg, the estimated right ventricular systolic pressure is moderately elevated at 42.5 mmHg. Left Atrium: Left atrial size was severely dilated. Right Atrium: Right atrial size was  severely dilated Pericardium: There is no evidence of pericardial effusion. Mitral Valve: The mitral valve is normal in structure. Trivial mitral valve regurgitation. No evidence of mitral valve stenosis by observation. MV peak gradient, 3.9 mmHg. Tricuspid Valve: The tricuspid valve is normal in structure. Tricuspid valve regurgitation is mild. Aortic Valve: The aortic valve is normal in structure. Aortic valve regurgitation is not visualized. The aortic valve is structurally normal, with no evidence of sclerosis or stenosis. Aortic valve mean gradient measures 4.0 mmHg. Aortic valve peak gradient measures 7.3 mmHg. Aortic valve area, by VTI measures 2.03 cm. Pulmonic Valve: The pulmonic valve was normal in structure. Pulmonic valve regurgitation is not visualized. Pulmonic regurgitation is not visualized. Aorta: The aortic root, ascending aorta and aortic arch are all structurally normal, with no evidence of dilitation or obstruction. Venous: The inferior vena cava is normal in size with greater than 50% respiratory  variability, suggesting right atrial pressure of 3 mmHg. IAS/Shunts: No atrial level shunt detected by color flow Doppler. There is no evidence of a patent foramen ovale. No ventricular septal defect is seen or detected. There is no evidence of an atrial septal defect.  LEFT VENTRICLE PLAX 2D LVIDd:         6.50 cm  Diastology LVIDs:         6.28 cm  LV e' lateral:   4.90 cm/s LV PW:         1.22 cm  LV E/e' lateral: 19.9 LV IVS:        0.91 cm  LV e' medial:    4.57 cm/s LVOT diam:     2.40 cm  LV E/e' medial:  21.3 LV SV:         17 ml LV SV Index:   7.55 LVOT Area:     4.52 cm  RIGHT VENTRICLE RV Basal diam:  4.40 cm LEFT ATRIUM           Index       RIGHT ATRIUM           Index LA diam:      4.50 cm 2.16 cm/m  RA Area:     15.40 cm LA Vol (A4C): 92.0 ml 44.09 ml/m RA Volume:   38.90 ml  18.64 ml/m  AORTIC VALVE                    PULMONIC VALVE AV Area (Vmax):    2.45 cm     PV Vmax:       0.58 m/s AV Area (Vmean):   2.15 cm     PV Vmean:      40.800 cm/s AV Area (VTI):     2.03 cm     PV VTI:        0.098 m AV Vmax:           135.00 cm/s  PV Peak grad:  1.3 mmHg AV Vmean:          101.000 cm/s PV Mean grad:  1.0 mmHg AV VTI:            0.252 m AV Peak Grad:      7.3 mmHg AV Mean Grad:      4.0 mmHg LVOT Vmax:         73.20 cm/s LVOT Vmean:        47.900 cm/s LVOT VTI:          0.113 m LVOT/AV VTI ratio: 0.45  AORTA Ao Root diam:  3.20 cm MITRAL VALVE                        TRICUSPID VALVE MV Area (PHT): 4.06 cm             TR Peak grad:   32.5 mmHg MV Peak grad:  3.9 mmHg             TR Vmax:        285.00 cm/s MV Mean grad:  1.0 mmHg MV Vmax:       0.99 m/s             SHUNTS MV Vmean:      48.8 cm/s            Systemic VTI:  0.11 m MV VTI:        0.21 m               Systemic Diam: 2.40 cm MV PHT:        54.23 msec MV Decel Time: 187 msec MV E velocity: 97.50 cm/s 103 cm/s MV A velocity: 80.00 cm/s 70.3 cm/s MV E/A ratio:  1.22       1.5  Neoma Laming MD Electronically signed by Neoma Laming MD  Signature Date/Time: 01/19/2019/2:46:46 PM    Final    CT HEAD CODE STROKE WO CONTRAST  Result Date: 01/17/2019 CLINICAL DATA:  Code stroke. EXAM: CT HEAD WITHOUT CONTRAST TECHNIQUE: Contiguous axial images were obtained from the base of the skull through the vertex without intravenous contrast. COMPARISON:  None. FINDINGS: Brain: There is no acute intracranial hemorrhage, mass-effect, or edema. Gray-white differentiation is preserved. There is no extra-axial fluid collection. Ventricles and sulci are within normal limits in size and configuration. Vascular: No hyperdense vessel.There is atherosclerotic calcification at the skull base. Skull: Calvarium is unremarkable. Sinuses/Orbits: No acute finding. Other: None. ASPECTS [facility] Stroke Program Early CT Score) - Ganglionic level infarction (caudate, lentiform nuclei, internal capsule, insula, M1-M3 cortex): 7 - Supraganglionic infarction (M4-M6 cortex): 3 Total score (0-10 with 10 being normal): 10 IMPRESSION: 1. No acute intracranial hemorrhage or evidence of acute infarction. 2. ASPECTS is 10 These results were called by telephone at the time of interpretation on 01/17/2019 at 7:28 pm to provider [facility] , who verbally acknowledged these results. Electronically Signed   By: Macy Mis M.D.   On: 01/17/2019 19:29    (Echo, Carotid, EGD, Colonoscopy, ERCP)    Subjective: Seen and examined the day of discharge Appears bit anxious, pressured speech otherwise stable Medically stable for discharge home  Discharge Exam: Vitals:   02/04/19 0445 02/04/19 0747  BP: 122/89 105/77  Pulse: 68 63  Resp: 18   Temp: (!) 97.5 F (36.4 C) 97.7 F (36.5 C)  SpO2: 98% 98%   Vitals:   02/03/19 1607 02/03/19 1931 02/04/19 0445 02/04/19 0747  BP: (!) 119/95 112/75 122/89 105/77  Pulse: 73 68 68 63  Resp: 18 20 18    Temp: 98.1 F (36.7 C) 97.8 F (36.6 C) (!) 97.5 F (36.4 C) 97.7 F (36.5 C)  TempSrc: Oral Oral Oral Oral  SpO2: 99% 96% 98%  98%  Weight:      Height:        General: Pt is alert, awake, not in acute distress Cardiovascular: RRR, S1/S2 +, no rubs, no gallops Respiratory: CTA bilaterally, no wheezing, no rhonchi Abdominal: Soft, NT, ND, bowel sounds + Extremities: no edema, no cyanosis    The results  of significant diagnostics from this hospitalization (including imaging, microbiology, ancillary and laboratory) are listed below for reference.     Microbiology: Recent Results (from the past 240 hour(s))  SARS CORONAVIRUS 2 (TAT 6-24 HRS) Nasopharyngeal Nasopharyngeal Swab     Status: None   Collection Time: 01/31/19  6:24 AM   Specimen: Nasopharyngeal Swab  Result Value Ref Range Status   SARS Coronavirus 2 NEGATIVE NEGATIVE Final    Comment: (NOTE) SARS-CoV-2 target nucleic acids are NOT DETECTED. The SARS-CoV-2 RNA is generally detectable in upper and lower respiratory specimens during the acute phase of infection. Negative results do not preclude SARS-CoV-2 infection, do not rule out co-infections with other pathogens, and should not be used as the sole basis for treatment or other patient management decisions. Negative results must be combined with clinical observations, patient history, and epidemiological information. The expected result is Negative. Fact Sheet for Patients: SugarRoll.be Fact Sheet for Healthcare Providers: https://www.woods-mathews.com/ This test is not yet approved or cleared by the [place] FDA and  has been authorized for detection and/or diagnosis of SARS-CoV-2 by FDA under an Emergency Use Authorization (EUA). This EUA will remain  in effect (meaning this test can be used) for the duration of the COVID-19 declaration under Section 56 4(b)(1) of the Act, 21 U.S.C. section 360bbb-3(b)(1), unless the authorization is terminated or revoked sooner. Performed at [facility], [place] [address], [place], [place] [address]       Labs: BNP (last 3 results) Recent Labs    12/29/18 0057 01/13/19 2223 01/31/19 0501  BNP 767.0* 1,147.0* 0000000*   Basic Metabolic Panel: Recent Labs  Lab 01/31/19 0501 02/01/19 1020 02/02/19 0534 02/03/19 0633  NA 142 139 138 136  K 3.8 4.2 4.2 4.3  CL 111 101 103 98  CO2 22 28 23 30   GLUCOSE 157* 149* 121* 121*  BUN 27* 37* 38* 43*  CREATININE 1.99* 2.11* 2.06* 1.97*  CALCIUM 8.9 9.0 9.2 9.3  MG  --  2.0 2.1 2.3   Liver Function Tests: No results for input(s): AST, ALT, ALKPHOS, BILITOT, PROT, ALBUMIN in the last 168 hours. No results for input(s): LIPASE, AMYLASE in the last 168 hours. No results for input(s): AMMONIA in the last 168 hours. CBC: Recent Labs  Lab 01/31/19 0501 01/31/19 0833 02/01/19 1020 02/02/19 0534 02/03/19 0633  WBC 5.6 5.6 4.7 4.6 4.1  NEUTROABS 3.5  --   --   --   --   HGB 11.8* 11.9* 11.7* 11.8* 12.4  HCT 38.8 38.9 39.1 39.1 40.4  MCV 86.4 85.1 87.3 85.9 85.4  PLT 313 340 314 318 349   Cardiac Enzymes: No results for input(s): CKTOTAL, CKMB, CKMBINDEX, TROPONINI in the last 168 hours. BNP: Invalid input(s): POCBNP CBG: No results for input(s): GLUCAP in the last 168 hours. D-Dimer No results for input(s): DDIMER in the last 72 hours. Hgb A1c No results for input(s): HGBA1C in the last 72 hours. Lipid Profile No results for input(s): CHOL, HDL, LDLCALC, TRIG, CHOLHDL, LDLDIRECT in the last 72 hours. Thyroid function studies No results for input(s): TSH, T4TOTAL, T3FREE, THYROIDAB in the last 72 hours.  Invalid input(s): FREET3 Anemia work up No results for input(s): VITAMINB12, FOLATE, FERRITIN, TIBC, IRON, RETICCTPCT in the last 72 hours. Urinalysis    Component Value Date/Time   COLORURINE YELLOW (A) 01/18/2019 1250   APPEARANCEUR HAZY (A) 01/18/2019 1250   LABSPEC 1.018 01/18/2019 1250   PHURINE 5.0 01/18/2019 1250   GLUCOSEU NEGATIVE 01/18/2019  [place] 01/18/2019 1250   BILIRUBINUR NEGATIVE  01/18/2019 1250   KETONESUR NEGATIVE 01/18/2019 1250   PROTEINUR 30 (A) 01/18/2019 1250   NITRITE NEGATIVE 01/18/2019 1250   LEUKOCYTESUR SMALL (A) 01/18/2019 1250   Sepsis Labs Invalid input(s): PROCALCITONIN,  WBC,  LACTICIDVEN Microbiology Recent Results (from the past 240 hour(s))  SARS CORONAVIRUS 2 (TAT 6-24 HRS) Nasopharyngeal Nasopharyngeal Swab     Status: None   Collection Time: 01/31/19  6:24 AM   Specimen: Nasopharyngeal Swab  Result Value Ref Range Status   SARS Coronavirus 2 NEGATIVE NEGATIVE Final    Comment: (NOTE) SARS-CoV-2 target nucleic acids are NOT DETECTED. The SARS-CoV-2 RNA is generally detectable in upper and lower respiratory specimens during the acute phase of infection. Negative results do not preclude SARS-CoV-2 infection, do not rule out co-infections with other pathogens, and should not be used as the sole basis for treatment or other patient management decisions. Negative results must be combined with clinical observations, patient history, and epidemiological information. The expected result is Negative. Fact Sheet for Patients: SugarRoll.be Fact Sheet for Healthcare Providers: https://www.woods-mathews.com/ This test is not yet approved or cleared by the [place] FDA and  has been authorized for detection and/or diagnosis of SARS-CoV-2 by FDA under an Emergency Use Authorization (EUA). This EUA will remain  in effect (meaning this test can be used) for the duration of the COVID-19 declaration under Section 56 4(b)(1) of the Act, 21 U.S.C. section 360bbb-3(b)(1), unless the authorization is terminated or revoked sooner. Performed at [facility], [place] [address] [place], [place] [address]      Time coordinating discharge: Over 30 minutes  SIGNED:   Sidney Ace, MD  [facility] 02/04/2019, 3:06 PM Pager   If 7PM-7AM, please contact  night-coverage [email or web address] Password [email or web address]

## 2019-02-11 ENCOUNTER — Other Ambulatory Visit: Payer: Self-pay

## 2019-02-11 ENCOUNTER — Emergency Department: Payer: Medicaid Other

## 2019-02-11 ENCOUNTER — Emergency Department
Admission: EM | Admit: 2019-02-11 | Discharge: 2019-02-12 | Disposition: A | Discharge status: Home / Self Care | Payer: Medicaid Other | Source: Home / Self Care | Attending: Student in an Organized Health Care Education/Training Program | Admitting: Student in an Organized Health Care Education/Training Program

## 2019-02-11 ENCOUNTER — Encounter: Payer: Self-pay | Admitting: Emergency Medicine

## 2019-02-11 DIAGNOSIS — Z8673 Personal history of transient ischemic attack (TIA), and cerebral infarction without residual deficits: Secondary | ICD-10-CM | POA: Insufficient documentation

## 2019-02-11 DIAGNOSIS — Z79899 Other long term (current) drug therapy: Secondary | ICD-10-CM | POA: Insufficient documentation

## 2019-02-11 DIAGNOSIS — I509 Heart failure, unspecified: Secondary | ICD-10-CM | POA: Insufficient documentation

## 2019-02-11 DIAGNOSIS — N189 Chronic kidney disease, unspecified: Secondary | ICD-10-CM | POA: Insufficient documentation

## 2019-02-11 DIAGNOSIS — F17200 Nicotine dependence, unspecified, uncomplicated: Secondary | ICD-10-CM | POA: Insufficient documentation

## 2019-02-11 DIAGNOSIS — Z7901 Long term (current) use of anticoagulants: Secondary | ICD-10-CM | POA: Insufficient documentation

## 2019-02-11 DIAGNOSIS — R0600 Dyspnea, unspecified: Secondary | ICD-10-CM

## 2019-02-11 DIAGNOSIS — I130 Hypertensive heart and chronic kidney disease with heart failure and stage 1 through stage 4 chronic kidney disease, or unspecified chronic kidney disease: Secondary | ICD-10-CM | POA: Insufficient documentation

## 2019-02-11 LAB — CBC WITH DIFFERENTIAL/PLATELET
Abs Immature Granulocytes: 0.01 10*3/uL (ref 0.00–0.07)
Basophils Absolute: 0 10*3/uL (ref 0.0–0.1)
Basophils Relative: 1 %
Eosinophils Absolute: 0.3 10*3/uL (ref 0.0–0.5)
Eosinophils Relative: 5 %
HCT: 38.8 % (ref 36.0–46.0)
Hemoglobin: 11.7 g/dL — ABNORMAL LOW (ref 12.0–15.0)
Immature Granulocytes: 0 %
Lymphocytes Relative: 25 %
Lymphs Abs: 1.3 10*3/uL (ref 0.7–4.0)
MCH: 25.8 pg — ABNORMAL LOW (ref 26.0–34.0)
MCHC: 30.2 g/dL (ref 30.0–36.0)
MCV: 85.5 fL (ref 80.0–100.0)
Monocytes Absolute: 0.5 10*3/uL (ref 0.1–1.0)
Monocytes Relative: 10 %
Neutro Abs: 3 10*3/uL (ref 1.7–7.7)
Neutrophils Relative %: 59 %
Platelets: 352 10*3/uL (ref 150–400)
RBC: 4.54 MIL/uL (ref 3.87–5.11)
RDW: 16.2 % — ABNORMAL HIGH (ref 11.5–15.5)
WBC: 5.1 10*3/uL (ref 4.0–10.5)
nRBC: 0 % (ref 0.0–0.2)

## 2019-02-11 LAB — COMPREHENSIVE METABOLIC PANEL
ALT: 37 U/L (ref 0–44)
AST: 41 U/L (ref 15–41)
Albumin: 3.3 g/dL — ABNORMAL LOW (ref 3.5–5.0)
Alkaline Phosphatase: 93 U/L (ref 38–126)
Anion gap: 8 (ref 5–15)
BUN: 31 mg/dL — ABNORMAL HIGH (ref 8–23)
CO2: 24 mmol/L (ref 22–32)
Calcium: 9 mg/dL (ref 8.9–10.3)
Chloride: 110 mmol/L (ref 98–111)
Creatinine, Ser: 2.24 mg/dL — ABNORMAL HIGH (ref 0.44–1.00)
GFR calc Af Amer: 26 mL/min — ABNORMAL LOW (ref >60)
GFR calc non Af Amer: 23 mL/min — ABNORMAL LOW (ref >60)
Glucose, Bld: 111 mg/dL — ABNORMAL HIGH (ref 70–99)
Potassium: 4 mmol/L (ref 3.5–5.1)
Sodium: 142 mmol/L (ref 135–145)
Total Bilirubin: 0.6 mg/dL (ref 0.3–1.2)
Total Protein: 6.7 g/dL (ref 6.5–8.1)

## 2019-02-11 LAB — URINE DRUG SCREEN, QUALITATIVE (ARMC ONLY)
Amphetamines, Ur Screen: NOT DETECTED
Barbiturates, Ur Screen: NOT DETECTED
Benzodiazepine, Ur Scrn: NOT DETECTED
Cannabinoid 50 Ng, Ur ~~LOC~~: NOT DETECTED
Cocaine Metabolite,Ur ~~LOC~~: POSITIVE — AB
MDMA (Ecstasy)Ur Screen: NOT DETECTED
Methadone Scn, Ur: NOT DETECTED
Opiate, Ur Screen: NOT DETECTED
Phencyclidine (PCP) Ur S: NOT DETECTED
Tricyclic, Ur Screen: NOT DETECTED

## 2019-02-11 LAB — TROPONIN I (HIGH SENSITIVITY)
Troponin I (High Sensitivity): 44 ng/L — ABNORMAL HIGH (ref <18)
Troponin I (High Sensitivity): 45 ng/L — ABNORMAL HIGH (ref <18)

## 2019-02-11 LAB — BRAIN NATRIURETIC PEPTIDE: B Natriuretic Peptide: 2470 pg/mL — ABNORMAL HIGH (ref 0.0–100.0)

## 2019-02-11 MED ORDER — APIXABAN 5 MG PO TABS
5.0000 mg | ORAL_TABLET | Freq: Two times a day (BID) | ORAL | Status: DC
  Administered 2019-02-11: 23:00:00 5 mg via ORAL
  Filled 2019-02-11: qty 1

## 2019-02-11 MED ORDER — IPRATROPIUM-ALBUTEROL 0.5-2.5 (3) MG/3ML IN SOLN
3.0000 mL | Freq: Once | RESPIRATORY_TRACT | Status: AC
  Administered 2019-02-11: 23:00:00 3 mL via RESPIRATORY_TRACT
  Filled 2019-02-11: qty 3

## 2019-02-11 MED ORDER — ASPIRIN 81 MG PO CHEW
324.0000 mg | CHEWABLE_TABLET | Freq: Once | ORAL | Status: AC
  Administered 2019-02-11: 324 mg via ORAL
  Filled 2019-02-11: qty 4

## 2019-02-11 MED ORDER — FUROSEMIDE 10 MG/ML IJ SOLN
40.0000 mg | Freq: Once | INTRAMUSCULAR | Status: AC
  Administered 2019-02-11: 23:00:00 40 mg via INTRAVENOUS
  Filled 2019-02-11: qty 4

## 2019-02-11 MED ORDER — CARVEDILOL 6.25 MG PO TABS: 3.1250 mg | ORAL_TABLET | Freq: Two times a day (BID) | ORAL | Status: DC

## 2019-02-11 NOTE — ED Triage Notes (Signed)
Pt to triage via w/c with no distress noted, mask in place; pt reports SHOB since yesterday; denies any accomp symptoms; hx CHF, HTN and cocaine abuse; denies pain

## 2019-02-11 NOTE — ED Notes (Signed)
Pt ambulated in room with pulse ox, pt O2 sat stayed between 97-100%. Pt states feeling more shob.

## 2019-02-11 NOTE — ED Provider Notes (Signed)
[facility] Emergency Department Provider Note    First MD Initiated Contact with Patient 02/11/19 2210     (approximate)  I have reviewed the triage vital signs and the nursing notes.   HISTORY  Chief Complaint Shortness of Breath    HPI Katelyn Lane is a 64 y.o. female below listed past medical history as well as history of substance abuse presents to the ER for 24 hours of shortness of breath and chest discomfort.  Did not take any of her meds today.  Is endorsing orthopnea.  Denies any fevers.  Denies any drug use today.  Feels similar to when she came to the hospital this past week.  Denies any nausea or vomiting.  No diarrhea.  No fevers.    Past Medical History:  Diagnosis Date  . CHF (congestive heart failure) ([place])   . Hyperlipidemia   . Renal disorder    Family History  Problem Relation Age of Onset  . Breast cancer Neg Hx    Past Surgical History:  Procedure Laterality Date  . RIGHT/LEFT HEART CATH AND CORONARY ANGIOGRAPHY N/A 12/30/2018   Procedure: RIGHT/LEFT HEART CATH AND CORONARY ANGIOGRAPHY;  Surgeon: Corey Skains, MD;  Location: [facility];  Service: Cardiovascular;  Laterality: N/A;   Patient Active Problem List   Diagnosis Date Noted  . Respiratory failure with hypoxia ([place]) 01/31/2019  . Stroke ([place]) 01/18/2019  . Dilated cardiomyopathy ([place]) 12/31/2018  . Chest tightness 12/31/2018  . Elevated troponin 12/31/2018  . Hypertensive urgency 12/31/2018  . Acute kidney injury superimposed on CKD ([place]) 12/31/2018  . Dyslipidemia 12/31/2018  . Depression 12/31/2018  . HFrEF (heart failure with reduced ejection fraction) ([place]) 12/29/2018      Prior to Admission medications   Medication Sig Start Date End Date Taking? Authorizing Provider  albuterol (VENTOLIN HFA) 108 (90 Base) MCG/ACT inhaler Inhale 2 puffs into the lungs every 6 (six) hours as needed for wheezing or shortness of breath. 01/15/19   Rudene Re, MD  apixaban (ELIQUIS) 5 MG TABS tablet Take 5 mg by mouth 2 (two) times daily.    [provider]  atorvastatin (LIPITOR) 20 MG tablet Take 20 mg by mouth daily at 6 PM.     [provider]  carvedilol (COREG) 3.125 MG tablet Take 1 tablet (3.125 mg total) by mouth 2 (two) times daily with a meal. 01/01/19   Nicole Kindred A, DO  FLUoxetine (PROZAC) 40 MG capsule Take 40 mg by mouth daily.     [provider]  furosemide (LASIX) 40 MG tablet Take 1 tablet (40 mg total) by mouth daily. 01/21/19 03/22/19  Lavina Hamman, MD  loratadine (CLARITIN) 10 MG tablet Take 1 tablet (10 mg total) by mouth daily. Patient taking differently: Take 10 mg by mouth daily as needed for itching.  01/02/19   Ezekiel Slocumb, DO  nitroGLYCERIN (NITROSTAT) 0.4 MG SL tablet Place 1 tablet (0.4 mg total) under the tongue every 5 (five) minutes as needed for chest pain. 01/01/19   Ezekiel Slocumb, DO  spironolactone (ALDACTONE) 25 MG tablet Take 0.5 tablets (12.5 mg total) by mouth daily. 01/02/19   Ezekiel Slocumb, DO  traZODone (DESYREL) 100 MG tablet Take 150 mg by mouth at bedtime as needed for sleep.  09/08/13   [provider]    Allergies Penicillins and Ace inhibitors    Social History Social History   Tobacco Use  . Smoking status: Current Every  Day Smoker  . Smokeless tobacco: Never Used  Substance Use Topics  . Alcohol use: No    Alcohol/week: 0.0 standard drinks  . Drug use: Yes    Types: Cocaine    Review of Systems Patient denies headaches, rhinorrhea, blurry vision, numbness, shortness of breath, chest pain, edema, cough, abdominal pain, nausea, vomiting, diarrhea, dysuria, fevers, rashes or hallucinations unless otherwise stated above in HPI. ____________________________________________   PHYSICAL EXAM:  VITAL SIGNS: Vitals:   02/11/19 2015  BP: 100/81  Pulse: 78  Resp: (!) 22  Temp: (!) 97.5 F (36.4 C)  SpO2: 97%     Constitutional: Alert, anxious appearing Eyes: Conjunctivae are normal.  Head: Atraumatic. Nose: No congestion/rhinnorhea. Mouth/Throat: Mucous membranes are moist.   Neck: No stridor. Painless ROM.  Cardiovascular: Normal rate, regular rhythm. Grossly normal heart sounds.  Good peripheral circulation. Respiratory: Normal respiratory effort.  No retractions. Lungs with coarse bibasilar breathsounds Gastrointestinal: Soft and nontender. No distention. No abdominal bruits. No CVA tenderness. Genitourinary:  Musculoskeletal: No lower extremity tenderness nor edema.  No joint effusions. Neurologic:  Normal speech and language. No gross focal neurologic deficits are appreciated. No facial droop Skin:  Skin is warm, dry and intact. No rash noted. Psychiatric: Mood and affect are normal. Speech and behavior are normal.  ____________________________________________   LABS (all labs ordered are listed, but only abnormal results are displayed)  Results for orders placed or performed during the hospital encounter of 02/11/19 (from the past 24 hour(s))  CBC with Differential     Status: Abnormal   Collection Time: 02/11/19  8:17 PM  Result Value Ref Range   WBC 5.1 4.0 - 10.5 K/uL   RBC 4.54 3.87 - 5.11 MIL/uL   Hemoglobin 11.7 (L) 12.0 - 15.0 g/dL   HCT 38.8 36.0 - 46.0 %   MCV 85.5 80.0 - 100.0 fL   MCH 25.8 (L) 26.0 - 34.0 pg   MCHC 30.2 30.0 - 36.0 g/dL   RDW 16.2 (H) 11.5 - 15.5 %   Platelets 352 150 - 400 K/uL   nRBC 0.0 0.0 - 0.2 %   Neutrophils Relative % 59 %   Neutro Abs 3.0 1.7 - 7.7 K/uL   Lymphocytes Relative 25 %   Lymphs Abs 1.3 0.7 - 4.0 K/uL   Monocytes Relative 10 %   Monocytes Absolute 0.5 0.1 - 1.0 K/uL   Eosinophils Relative 5 %   Eosinophils Absolute 0.3 0.0 - 0.5 K/uL   Basophils Relative 1 %   Basophils Absolute 0.0 0.0 - 0.1 K/uL   Immature Granulocytes 0 %   Abs Immature Granulocytes 0.01 0.00 - 0.07 K/uL  Comprehensive metabolic panel     Status:  Abnormal   Collection Time: 02/11/19  8:17 PM  Result Value Ref Range   Sodium 142 135 - 145 mmol/L   Potassium 4.0 3.5 - 5.1 mmol/L   Chloride 110 98 - 111 mmol/L   CO2 24 22 - 32 mmol/L   Glucose, Bld 111 (H) 70 - 99 mg/dL   BUN 31 (H) 8 - 23 mg/dL   Creatinine, Ser 2.24 (H) 0.44 - 1.00 mg/dL   Calcium 9.0 8.9 - 10.3 mg/dL   Total Protein 6.7 6.5 - 8.1 g/dL   Albumin 3.3 (L) 3.5 - 5.0 g/dL   AST 41 15 - 41 U/L   ALT 37 0 - 44 U/L   Alkaline Phosphatase 93 38 - 126 U/L   Total Bilirubin 0.6 0.3 - 1.2 mg/dL  GFR calc non Af Amer 23 (L) >60 mL/min   GFR calc Af Amer 26 (L) >60 mL/min   Anion gap 8 5 - 15  Troponin I (High Sensitivity)     Status: Abnormal   Collection Time: 02/11/19  8:17 PM  Result Value Ref Range   Troponin I (High Sensitivity) 44 (H) <18 ng/L  Brain natriuretic peptide     Status: Abnormal   Collection Time: 02/11/19  8:17 PM  Result Value Ref Range   B Natriuretic Peptide 2,470.0 (H) 0.0 - 100.0 pg/mL   ____________________________________________  EKG My review and personal interpretation at Time: 20:12   Indication: sob  Rate: 75  Rhythm: sinus Axis: left Other: nonspecific st abn, no stemi ____________________________________________  RADIOLOGY  I personally reviewed all radiographic images ordered to evaluate for the above acute complaints and reviewed radiology reports and findings.  These findings were personally discussed with the patient.  Please see medical record for radiology report.  ____________________________________________   PROCEDURES  Procedure(s) performed:  Procedures    Critical Care performed: no ____________________________________________   INITIAL IMPRESSION / ASSESSMENT AND PLAN / ED COURSE  Pertinent labs & imaging results that were available during my care of the patient were reviewed by me and considered in my medical decision making (see chart for details).   DDX: ACS, cf, pe, copd pna, bronchitis,  costochondritis, covid   Katelyn Lane is a 64 y.o. who presents to the ED with symptoms as described above.  Patient nontoxic-appearing protecting her airway.  No hypoxia.  Mildly tachypneic.  Blood work does show worsening BMP but her chest x-ray without any significant worsening of edema.  Will reorder her meds for today.  Nonischemic EKG.  Troponin roughly at her baseline.  Patient is on Eliquis therefore I have lower suspicion for PE.  Will give meds ambulate to see if she is having any exertional hypoxia.  Patient be signed out to oncoming physician.  Anticipate discharge home if repeat troponin negative and no hypoxia.     The patient was evaluated in Emergency Department today for the symptoms described in the history of present illness. He/she was evaluated in the context of the global COVID-19 pandemic, which necessitated consideration that the patient might be at risk for infection with the SARS-CoV-2 virus that causes COVID-19. Institutional protocols and algorithms that pertain to the evaluation of patients at risk for COVID-19 are in a state of rapid change based on information released by regulatory bodies including the CDC and federal and state organizations. These policies and algorithms were followed during the patient's care in the ED.  As part of my medical decision making, I reviewed the following data within the [place] notes reviewed and incorporated, Labs reviewed, notes from prior ED visits and [place] Controlled Substance Database   ____________________________________________   FINAL CLINICAL IMPRESSION(S) / ED DIAGNOSES  Final diagnoses:  Dyspnea, unspecified type  Chronic congestive heart failure, unspecified heart failure type ([place])      NEW MEDICATIONS STARTED DURING THIS VISIT:  New Prescriptions   No medications on file     Note:  This document was prepared using Dragon voice recognition software and may include unintentional  dictation errors.    Merlyn Lot, MD 02/11/19 2232

## 2019-02-12 NOTE — ED Notes (Signed)
MD at bedside. 

## 2019-02-13 ENCOUNTER — Encounter: Payer: Self-pay | Admitting: Emergency Medicine

## 2019-02-13 ENCOUNTER — Emergency Department: Payer: Medicaid Other

## 2019-02-13 ENCOUNTER — Other Ambulatory Visit: Payer: Self-pay

## 2019-02-13 ENCOUNTER — Inpatient Hospital Stay
Admission: EM | Admit: 2019-02-13 | Discharge: 2019-02-16 | DRG: 291 | Disposition: A | Discharge status: Home / Self Care | Payer: Medicaid Other | Attending: Internal Medicine | Admitting: Internal Medicine

## 2019-02-13 DIAGNOSIS — Z20822 Contact with and (suspected) exposure to covid-19: Secondary | ICD-10-CM | POA: Diagnosis present

## 2019-02-13 DIAGNOSIS — Z79899 Other long term (current) drug therapy: Secondary | ICD-10-CM | POA: Diagnosis not present

## 2019-02-13 DIAGNOSIS — R109 Unspecified abdominal pain: Secondary | ICD-10-CM | POA: Diagnosis not present

## 2019-02-13 DIAGNOSIS — R112 Nausea with vomiting, unspecified: Secondary | ICD-10-CM | POA: Diagnosis present

## 2019-02-13 DIAGNOSIS — N179 Acute kidney failure, unspecified: Secondary | ICD-10-CM | POA: Diagnosis present

## 2019-02-13 DIAGNOSIS — M6282 Rhabdomyolysis: Secondary | ICD-10-CM | POA: Diagnosis not present

## 2019-02-13 DIAGNOSIS — Z8673 Personal history of transient ischemic attack (TIA), and cerebral infarction without residual deficits: Secondary | ICD-10-CM | POA: Diagnosis not present

## 2019-02-13 DIAGNOSIS — I5020 Unspecified systolic (congestive) heart failure: Secondary | ICD-10-CM | POA: Diagnosis present

## 2019-02-13 DIAGNOSIS — D631 Anemia in chronic kidney disease: Secondary | ICD-10-CM | POA: Diagnosis present

## 2019-02-13 DIAGNOSIS — R197 Diarrhea, unspecified: Secondary | ICD-10-CM | POA: Diagnosis not present

## 2019-02-13 DIAGNOSIS — K5730 Diverticulosis of large intestine without perforation or abscess without bleeding: Secondary | ICD-10-CM | POA: Diagnosis present

## 2019-02-13 DIAGNOSIS — F329 Major depressive disorder, single episode, unspecified: Secondary | ICD-10-CM | POA: Diagnosis present

## 2019-02-13 DIAGNOSIS — Z880 Allergy status to penicillin: Secondary | ICD-10-CM | POA: Diagnosis not present

## 2019-02-13 DIAGNOSIS — F1410 Cocaine abuse, uncomplicated: Secondary | ICD-10-CM | POA: Diagnosis present

## 2019-02-13 DIAGNOSIS — I420 Dilated cardiomyopathy: Secondary | ICD-10-CM | POA: Diagnosis present

## 2019-02-13 DIAGNOSIS — I130 Hypertensive heart and chronic kidney disease with heart failure and stage 1 through stage 4 chronic kidney disease, or unspecified chronic kidney disease: Principal | ICD-10-CM | POA: Diagnosis present

## 2019-02-13 DIAGNOSIS — I5022 Chronic systolic (congestive) heart failure: Secondary | ICD-10-CM

## 2019-02-13 DIAGNOSIS — Z7901 Long term (current) use of anticoagulants: Secondary | ICD-10-CM | POA: Diagnosis not present

## 2019-02-13 DIAGNOSIS — I509 Heart failure, unspecified: Secondary | ICD-10-CM | POA: Diagnosis present

## 2019-02-13 DIAGNOSIS — I5023 Acute on chronic systolic (congestive) heart failure: Secondary | ICD-10-CM | POA: Diagnosis present

## 2019-02-13 DIAGNOSIS — F17200 Nicotine dependence, unspecified, uncomplicated: Secondary | ICD-10-CM | POA: Diagnosis present

## 2019-02-13 DIAGNOSIS — Z720 Tobacco use: Secondary | ICD-10-CM | POA: Diagnosis not present

## 2019-02-13 DIAGNOSIS — I160 Hypertensive urgency: Secondary | ICD-10-CM | POA: Diagnosis present

## 2019-02-13 DIAGNOSIS — E785 Hyperlipidemia, unspecified: Secondary | ICD-10-CM | POA: Diagnosis present

## 2019-02-13 DIAGNOSIS — F1490 Cocaine use, unspecified, uncomplicated: Secondary | ICD-10-CM | POA: Diagnosis present

## 2019-02-13 DIAGNOSIS — N183 Chronic kidney disease, stage 3 unspecified: Secondary | ICD-10-CM | POA: Diagnosis present

## 2019-02-13 DIAGNOSIS — R1084 Generalized abdominal pain: Secondary | ICD-10-CM | POA: Diagnosis not present

## 2019-02-13 LAB — HEPATIC FUNCTION PANEL
ALT: 48 U/L — ABNORMAL HIGH (ref 0–44)
AST: 65 U/L — ABNORMAL HIGH (ref 15–41)
Albumin: 3.4 g/dL — ABNORMAL LOW (ref 3.5–5.0)
Alkaline Phosphatase: 108 U/L (ref 38–126)
Bilirubin, Direct: <0.1 mg/dL (ref 0.0–0.2)
Total Bilirubin: 0.8 mg/dL (ref 0.3–1.2)
Total Protein: 7.1 g/dL (ref 6.5–8.1)

## 2019-02-13 LAB — BASIC METABOLIC PANEL
Anion gap: 10 (ref 5–15)
BUN: 33 mg/dL — ABNORMAL HIGH (ref 8–23)
CO2: 24 mmol/L (ref 22–32)
Calcium: 9.4 mg/dL (ref 8.9–10.3)
Chloride: 110 mmol/L (ref 98–111)
Creatinine, Ser: 2.03 mg/dL — ABNORMAL HIGH (ref 0.44–1.00)
GFR calc Af Amer: 29 mL/min — ABNORMAL LOW (ref >60)
GFR calc non Af Amer: 25 mL/min — ABNORMAL LOW (ref >60)
Glucose, Bld: 136 mg/dL — ABNORMAL HIGH (ref 70–99)
Potassium: 3.8 mmol/L (ref 3.5–5.1)
Sodium: 144 mmol/L (ref 135–145)

## 2019-02-13 LAB — CBC
HCT: 40.3 % (ref 36.0–46.0)
Hemoglobin: 12.2 g/dL (ref 12.0–15.0)
MCH: 26 pg (ref 26.0–34.0)
MCHC: 30.3 g/dL (ref 30.0–36.0)
MCV: 85.9 fL (ref 80.0–100.0)
Platelets: 339 10*3/uL (ref 150–400)
RBC: 4.69 MIL/uL (ref 3.87–5.11)
RDW: 16.5 % — ABNORMAL HIGH (ref 11.5–15.5)
WBC: 4.7 10*3/uL (ref 4.0–10.5)
nRBC: 0 % (ref 0.0–0.2)

## 2019-02-13 LAB — TROPONIN I (HIGH SENSITIVITY)
Troponin I (High Sensitivity): 60 ng/L — ABNORMAL HIGH (ref <18)
Troponin I (High Sensitivity): 56 ng/L — ABNORMAL HIGH (ref <18)

## 2019-02-13 LAB — PROTIME-INR
INR: 1.1 (ref 0.8–1.2)
Prothrombin Time: 14.4 seconds (ref 11.4–15.2)

## 2019-02-13 LAB — BRAIN NATRIURETIC PEPTIDE: B Natriuretic Peptide: 2135 pg/mL — ABNORMAL HIGH (ref 0.0–100.0)

## 2019-02-13 LAB — SARS CORONAVIRUS 2 (TAT 6-24 HRS): SARS Coronavirus 2: NEGATIVE

## 2019-02-13 LAB — LIPASE, BLOOD: Lipase: 36 U/L (ref 11–51)

## 2019-02-13 MED ORDER — APIXABAN 5 MG PO TABS
5.0000 mg | ORAL_TABLET | Freq: Two times a day (BID) | ORAL | Status: DC
  Administered 2019-02-13 – 2019-02-16 (×6): 5 mg via ORAL
  Filled 2019-02-13 (×6): qty 1

## 2019-02-13 MED ORDER — SODIUM CHLORIDE 0.9 % IV SOLN: 250.0000 mL | INTRAVENOUS | Status: DC | PRN

## 2019-02-13 MED ORDER — FUROSEMIDE 10 MG/ML IJ SOLN
20.0000 mg | Freq: Every day | INTRAMUSCULAR | Status: DC
  Administered 2019-02-14: 20 mg via INTRAVENOUS
  Filled 2019-02-13: qty 2

## 2019-02-13 MED ORDER — TRAZODONE HCL 50 MG PO TABS
150.0000 mg | ORAL_TABLET | Freq: Every evening | ORAL | Status: DC | PRN
  Administered 2019-02-14 – 2019-02-15 (×2): 150 mg via ORAL
  Filled 2019-02-13 (×2): qty 1

## 2019-02-13 MED ORDER — ALPRAZOLAM 0.5 MG PO TABS
1.0000 mg | ORAL_TABLET | Freq: Once | ORAL | Status: DC
  Filled 2019-02-13: qty 2

## 2019-02-13 MED ORDER — ONDANSETRON HCL 4 MG/2ML IJ SOLN: 4.0000 mg | Freq: Four times a day (QID) | INTRAMUSCULAR | Status: DC | PRN

## 2019-02-13 MED ORDER — SPIRONOLACTONE 25 MG PO TABS
12.5000 mg | ORAL_TABLET | Freq: Every day | ORAL | Status: DC
  Administered 2019-02-14 – 2019-02-16 (×3): 12.5 mg via ORAL
  Filled 2019-02-13 (×2): qty 0.5
  Filled 2019-02-13: qty 1
  Filled 2019-02-13: qty 0.5
  Filled 2019-02-13 (×2): qty 1

## 2019-02-13 MED ORDER — FUROSEMIDE 10 MG/ML IJ SOLN
40.0000 mg | Freq: Two times a day (BID) | INTRAMUSCULAR | Status: DC
  Administered 2019-02-13: 40 mg via INTRAVENOUS
  Filled 2019-02-13: qty 4

## 2019-02-13 MED ORDER — SODIUM CHLORIDE 0.9% FLUSH: 3.0000 mL | INTRAVENOUS | Status: DC | PRN

## 2019-02-13 MED ORDER — ALPRAZOLAM 0.5 MG PO TABS
1.0000 mg | ORAL_TABLET | Freq: Once | ORAL | Status: AC
  Administered 2019-02-13: 1 mg via ORAL
  Filled 2019-02-13: qty 2

## 2019-02-13 MED ORDER — ONDANSETRON HCL 4 MG/2ML IJ SOLN
4.0000 mg | Freq: Once | INTRAMUSCULAR | Status: AC
  Administered 2019-02-13: 4 mg via INTRAVENOUS
  Filled 2019-02-13: qty 2

## 2019-02-13 MED ORDER — ALBUTEROL SULFATE (2.5 MG/3ML) 0.083% IN NEBU: 2.5000 mg | INHALATION_SOLUTION | Freq: Four times a day (QID) | RESPIRATORY_TRACT | Status: DC | PRN

## 2019-02-13 MED ORDER — FLUOXETINE HCL 20 MG PO CAPS
40.0000 mg | ORAL_CAPSULE | Freq: Every day | ORAL | Status: DC
  Administered 2019-02-14 – 2019-02-16 (×3): 40 mg via ORAL
  Filled 2019-02-13 (×3): qty 2

## 2019-02-13 MED ORDER — ACETAMINOPHEN 325 MG PO TABS
650.0000 mg | ORAL_TABLET | ORAL | Status: DC | PRN
  Administered 2019-02-16: 650 mg via ORAL
  Filled 2019-02-13: qty 2

## 2019-02-13 MED ORDER — ATORVASTATIN CALCIUM 20 MG PO TABS
20.0000 mg | ORAL_TABLET | Freq: Every day | ORAL | Status: DC
  Administered 2019-02-13 – 2019-02-15 (×3): 20 mg via ORAL
  Filled 2019-02-13 (×3): qty 1

## 2019-02-13 MED ORDER — NITROGLYCERIN 0.4 MG SL SUBL: 0.4000 mg | SUBLINGUAL_TABLET | SUBLINGUAL | Status: DC | PRN

## 2019-02-13 MED ORDER — CARVEDILOL 6.25 MG PO TABS
3.1250 mg | ORAL_TABLET | Freq: Two times a day (BID) | ORAL | Status: DC
  Administered 2019-02-13 – 2019-02-16 (×6): 3.125 mg via ORAL
  Filled 2019-02-13 (×6): qty 1

## 2019-02-13 MED ORDER — DICYCLOMINE HCL 10 MG PO CAPS
10.0000 mg | ORAL_CAPSULE | Freq: Once | ORAL | Status: AC
  Administered 2019-02-13: 10 mg via ORAL
  Filled 2019-02-13: qty 1

## 2019-02-13 MED ORDER — SODIUM CHLORIDE 0.9% FLUSH
3.0000 mL | Freq: Two times a day (BID) | INTRAVENOUS | Status: DC
  Administered 2019-02-13 – 2019-02-15 (×6): 3 mL via INTRAVENOUS

## 2019-02-13 MED ORDER — FUROSEMIDE 10 MG/ML IJ SOLN
40.0000 mg | Freq: Once | INTRAMUSCULAR | Status: AC
  Administered 2019-02-13: 40 mg via INTRAVENOUS
  Filled 2019-02-13: qty 4

## 2019-02-13 NOTE — Progress Notes (Signed)
Pt c/o of abdominal pain 8 out of 10, also saying she's nauseated and at the same time saying she's "very hungry and don't bring me no dried up [place] sandwich neither!"

## 2019-02-13 NOTE — ED Notes (Addendum)
Pt states she did not like ED lunch box. Threw food on ground and asks for new dietary tray. Pt is aggressive despite education on admission process and diet orders. Will place diet and request tray per pt request.

## 2019-02-13 NOTE — Progress Notes (Signed)
   02/13/19 1800  Clinical Encounter Type  Visited With Patient  Visit Type Initial  Referral From Nurse  Consult/Referral To Chaplain  Spiritual Encounters  Spiritual Needs Prayer   This is a Order Req: Chaplain visited with patient who was laying in the bed in the fetal position on her left side moaning in pain. Patient stated that she has been in the hospital seven times for the same thing. Patient asked for prayer to make it through. Chaplain offered prayer.   End of Visit No Further

## 2019-02-13 NOTE — ED Notes (Signed)
Requested dietary tray via phone.

## 2019-02-13 NOTE — H&P (Signed)
[facility] [place] NAME: Katelyn Lane    MR#:  [id number]  DATE OF BIRTH:  [birth year]  DATE OF ADMISSION:  02/13/2019  PRIMARY CARE PHYSICIAN: Alene Mires Elyse Jarvis, MD   REQUESTING/REFERRING PHYSICIAN: Arta Silence, MD  CHIEF COMPLAINT:   Chief Complaint  Patient presents with  . Chest Pain    HISTORY OF PRESENT ILLNESS:  Katelyn Lane  is a 64 y.o. female with a known history of CHF, dilated cardiomyopathy, hypertension as well as substance abuse who presents with primarily nausea and vomiting since last night, associated with crampy diffuse abdominal discomfort, but not associated with diarrhea or fever.  The patient also reports some increased shortness of breath and chest tightness.  She reports a trace amount of blood in her stool which she believes is related to the Eliquis.   She was also seen in the ED 2 days ago with worsening shortness of breath and chest discomfort and was D/C with normal work up.  The patient's x-ray is unchanged from prior, her BNP is similar to her visit from a few days ago, but her troponin is more elevated.  Her other lab work-up is within normal limits.  She was treated symptomatically with Zofran, Bentyl, and an IV dose of Lasix. The patient initially had good relief with Zofran and Bentyl, but then reported recurrent pain and states that she vomited a few times. EDP obtained a CT, which showed no significant acute findings.  Given the worsened troponin and continued very high BNP as well as the patient's continued discomfort and difficulty with p.o.,EDP recommends admission.   Patient is drowsy when I saw her in ED and food all over the floors next to her bed. PAST MEDICAL HISTORY:   Past Medical History:  Diagnosis Date  . CHF (congestive heart failure) ([place])   . Hyperlipidemia   . Renal disorder     PAST SURGICAL HISTORY:   Past Surgical History:  Procedure Laterality Date  . RIGHT/LEFT HEART  CATH AND CORONARY ANGIOGRAPHY N/A 12/30/2018   Procedure: RIGHT/LEFT HEART CATH AND CORONARY ANGIOGRAPHY;  Surgeon: Corey Skains, MD;  Location: [facility];  Service: Cardiovascular;  Laterality: N/A;    SOCIAL HISTORY:   Social History   Tobacco Use  . Smoking status: Current Every Day Smoker  . Smokeless tobacco: Never Used  Substance Use Topics  . Alcohol use: No    Alcohol/week: 0.0 standard drinks    FAMILY HISTORY:   Family History  Problem Relation Age of Onset  . Breast cancer Neg Hx     DRUG ALLERGIES:   Allergies  Allergen Reactions  . Penicillins Hives, Rash and Other (See Comments)    Did it involve swelling of the face/tongue/throat, SOB, or low BP? No Did it involve sudden or severe rash/hives, skin peeling, or any reaction on the inside of your mouth or nose? Yes Did you need to seek medical attention at a hospital or doctor's office? Yes When did it last happen? >25 years If all above answers are "NO", may proceed with cephalosporin use.   . Ace Inhibitors Nausea And Vomiting    REVIEW OF SYSTEMS:   ROS: unable to perform due to her mental status/drowsy-sleepy  MEDICATIONS AT HOME:   Prior to Admission medications   Medication Sig Start Date End Date Taking? Authorizing Provider  albuterol (VENTOLIN HFA) 108 (90 Base) MCG/ACT inhaler Inhale 2 puffs into the lungs every 6 (six) hours as needed for  wheezing or shortness of breath. 01/15/19  Yes Verones[place], [place], MD  apixaban (ELIQUIS) 5 MG TABS tablet Take 5 mg by mouth 2 (two) times daily.   Yes [provider]  atorvastatin (LIPITOR) 20 MG tablet Take 20 mg by mouth daily at 6 PM.    Yes [provider]  carvedilol (COREG) 3.125 MG tablet Take 1 tablet (3.125 mg total) by mouth 2 (two) times daily with a meal. 01/01/19  Yes Nicole Kindred A, DO  FLUoxetine (PROZAC) 40 MG capsule Take 40 mg by mouth daily.    Yes [provider]  furosemide (LASIX) 40 MG  tablet Take 1 tablet (40 mg total) by mouth daily. 01/21/19 03/22/19 Yes Lavina Hamman, MD  loratadine (CLARITIN) 10 MG tablet Take 1 tablet (10 mg total) by mouth daily. Patient taking differently: Take 10 mg by mouth daily as needed for itching.  01/02/19  Yes Nicole Kindred A, DO  nitroGLYCERIN (NITROSTAT) 0.4 MG SL tablet Place 1 tablet (0.4 mg total) under the tongue every 5 (five) minutes as needed for chest pain. 01/01/19  Yes Nicole Kindred A, DO  spironolactone (ALDACTONE) 25 MG tablet Take 0.5 tablets (12.5 mg total) by mouth daily. 01/02/19  Yes Nicole Kindred A, DO  traZODone (DESYREL) 100 MG tablet Take 150 mg by mouth at bedtime as needed for sleep.  09/08/13  Yes [provider]      VITAL SIGNS:  Blood pressure (!) 130/109, pulse 77, temperature 97.6 F (36.4 C), resp. rate 17, height 5\' 6"  (1.676 m), weight 96.6 kg, SpO2 97 %.  PHYSICAL EXAMINATION:  Physical Exam  GENERAL:  64 y.o.-year-old patient lying in the bed with no acute distress.  EYES: Pupils equal, round, reactive to light and accommodation. No scleral icterus. Extraocular muscles intact.  HEENT: Head atraumatic, normocephalic. Oropharynx and nasopharynx clear.  NECK:  Supple, no jugular venous distention. No thyroid enlargement, no tenderness.  LUNGS: Normal breath sounds bilaterally, no wheezing, rales,rhonchi or crepitation. No use of accessory muscles of respiration.  CARDIOVASCULAR: S1, S2 normal. No murmurs, rubs, or gallops.  ABDOMEN: Soft, nontender, nondistended. Bowel sounds present. No organomegaly or mass.  EXTREMITIES: No pedal edema, cyanosis, or clubbing.  NEUROLOGIC: nonfocal, drowsy  PSYCHIATRIC: The patient is drowsy  SKIN: No obvious rash, lesion, or ulcer.   LABORATORY PANEL:   CBC Recent Labs  Lab 02/13/19 0716  WBC 4.7  HGB 12.2  HCT 40.3  PLT 339    ------------------------------------------------------------------------------------------------------------------  Chemistries  Recent Labs  Lab 02/13/19 0716  NA 144  K 3.8  CL 110  CO2 24  GLUCOSE 136*  BUN 33*  CREATININE 2.03*  CALCIUM 9.4  AST 65*  ALT 48*  ALKPHOS 108  BILITOT 0.8   ------------------------------------------------------------------------------------------------------------------  Cardiac Enzymes No results for input(s): TROPONINI in the last 168 hours. ------------------------------------------------------------------------------------------------------------------  RADIOLOGY:  CT ABDOMEN PELVIS WO CONTRAST  Result Date: 02/13/2019 CLINICAL DATA:  Generalized abdominal pain. EXAM: CT ABDOMEN AND PELVIS WITHOUT CONTRAST TECHNIQUE: Multidetector CT imaging of the abdomen and pelvis was performed following the standard protocol without IV contrast. COMPARISON:  08/07/2009 FINDINGS: Lower chest: The lung bases are clear of an acute process. There is dependent bibasilar atelectasis but no pleural effusion. The heart is enlarged. No pericardial effusion. Hepatobiliary: No obvious hepatic lesions without contrast. No intra or extrahepatic biliary dilatation. The gallbladder appears normal. Pancreas: No obvious mass, inflammation or ductal dilatation. Spleen: Normal size. No focal lesions. Adrenals/Urinary Tract: The adrenal glands are unremarkable. Stable  renal cortical scarring changes or fetal lobulations but no worrisome renal lesions, hydronephrosis or obstructing ureteral calculi. No obvious bladder lesions. Stomach/Bowel: The stomach, duodenum, small bowel and colon are grossly normal without oral contrast. No acute inflammatory changes, mass lesions or obstructive findings. The terminal ileum and appendix are normal. Moderate scattered descending and sigmoid colon diverticulosis without findings for acute diverticulitis. Vascular/Lymphatic: Scattered  atherosclerotic calcifications involving the abdominal aorta and iliac arteries. No focal aneurysm. No mesenteric or retroperitoneal mass or adenopathy. Reproductive: The uterus and ovaries are surgically absent. Moderate air is noted in the vagina which could be due to recent exam. I do not see any definite findings for a rectovaginal fistula. Other: No pelvic mass or adenopathy. No free pelvic fluid collections. No inguinal mass or adenopathy. No abdominal wall hernia or subcutaneous lesions. Musculoskeletal: No significant bony findings. IMPRESSION: 1. No acute abdominal/pelvic findings, mass lesions or adenopathy. 2. Stable renal cortical scarring changes or fetal lobulations. No obstructing ureteral calculi or bladder calculi. 3. Moderate air in the vagina could be due to recent pelvic exam. No definite findings for a rectovaginal fistula. 4. Stable cardiac enlargement. Aortic Atherosclerosis (ICD10-I70.0). Electronically Signed   By: Marijo Sanes M.D.   On: 02/13/2019 12:48   DG Chest 2 View  Result Date: 02/13/2019 CLINICAL DATA:  Shortness of breath. EXAM: CHEST - 2 VIEW COMPARISON:  Chest x-ray 02/11/2019 FINDINGS: Stable cardiac enlargement. Mild vascular congestion and persistent mild interstitial edema. No definite pleural effusions or focal infiltrates. IMPRESSION: Persistent cardiac enlargement, vascular congestion and mild interstitial edema. Electronically Signed   By: Marijo Sanes M.D.   On: 02/13/2019 07:49   DG Chest 2 View  Result Date: 02/11/2019 CLINICAL DATA:  Shortness of breath EXAM: CHEST - 2 VIEW COMPARISON:  01/31/2019 FINDINGS: Cardiac shadow is enlarged but stable. The lungs are well aerated bilaterally. No focal infiltrate or sizable effusion is seen. Minimal interstitial edema is noted. No bony abnormality is noted. IMPRESSION: Mild changes of CHF. Electronically Signed   By: Inez Catalina M.D.   On: 02/11/2019 20:40   IMPRESSION AND PLAN:  64 y.o. female with medical  history significant for HFrEF/dilated cardiomyopathy with EF 15%, hypertension, hyperlipidemia, CKD stage III, depression, and substance use (tobacco/cocaine) who presents to the ED for N/V/Abd pain and SOB  N/V/Abd pain - unsure etio, could be viral G.E. - check UDS - CT abd-pelvis neg for acute patho  Chronic systolic CHF exacerbation Hx ofdilated cardiomyopathy: Echocardiogram 12/29/2018 showed [id number], grade 3 diastolic dysfunction, global LV hypokinesis. Right/left heart cath 12/31/2018 showed severe dilated cardiomyopathy with EF 15% and minimal atherosclerosis of left circumflex and right coronary. --Pt presented with dyspnea, BNP 2135.  - based on my clinical exam - she looks euvolemic, her creat is also on higher than before so I will hold off agressive diuresis. For now, keep her on lasix 20 mg IV daily. May need to hold lasix if creat worsen.  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 Monitor while on small dose of lasix.  Anemia: c-scopy in 2017 and was pos for only polyps. EGD: never.  Hypertension: Continue home blood pressure medications  Hyperlipidemia: atorvastatin 40 mg daily.  Depression: Continue fluoxetine, trazodone. Needed one time dose of xanax as she was very anxious  Tobacco use: Patient counseled on smoking cessation.  Substance use: Check UDS   All the records are reviewed and  case discussed with ED provider. Management plans discussed with the patient, nursing and they are in agreement.  CODE STATUS: Full Code  TOTAL TIME TAKING CARE OF THIS PATIENT: 45 minutes.    Max Sane M.D on 02/13/2019 at 3:52 PM  [facility]   CC: Primary care physician; Alene Mires Elyse Jarvis, MD   Note: This dictation was prepared with Dragon dictation along with smaller phrase technology. Any transcriptional errors that result from this  process are unintentional.

## 2019-02-13 NOTE — ED Provider Notes (Signed)
[facility] Emergency Department Provider Note ____________________________________________   First MD Initiated Contact with Patient 02/13/19 [phone]     (approximate)  I have reviewed the triage vital signs and the nursing notes.   HISTORY  Chief Complaint Chest Pain    HPI Katelyn Lane is a 64 y.o. female with PMH as noted below including CHF, dilated cardiomyopathy, hypertension as well as substance abuse who presents with primarily nausea and vomiting since last night, associated with crampy diffuse abdominal discomfort, but not associated with diarrhea or fever.  The patient also reports some increased shortness of breath and chest tightness.  She reports a trace amount of blood in her stool which she believes is related to the Eliquis.  Past Medical History:  Diagnosis Date  . CHF (congestive heart failure) ([place])   . Hyperlipidemia   . Renal disorder     Patient Active Problem List   Diagnosis Date Noted  . Respiratory failure with hypoxia ([place]) 01/31/2019  . Stroke ([place]) 01/18/2019  . Dilated cardiomyopathy ([place]) 12/31/2018  . Chest tightness 12/31/2018  . Elevated troponin 12/31/2018  . Hypertensive urgency 12/31/2018  . Acute kidney injury superimposed on CKD ([place]) 12/31/2018  . Dyslipidemia 12/31/2018  . Depression 12/31/2018  . HFrEF (heart failure with reduced ejection fraction) ([place]) 12/29/2018    Past Surgical History:  Procedure Laterality Date  . RIGHT/LEFT HEART CATH AND CORONARY ANGIOGRAPHY N/A 12/30/2018   Procedure: RIGHT/LEFT HEART CATH AND CORONARY ANGIOGRAPHY;  Surgeon: Corey Skains, MD;  Location: [facility];  Service: Cardiovascular;  Laterality: N/A;    Prior to Admission medications   Medication Sig Start Date End Date Taking? Authorizing Provider  albuterol (VENTOLIN HFA) 108 (90 Base) MCG/ACT inhaler Inhale 2 puffs into the lungs every 6 (six) hours as needed for wheezing or shortness of breath.  01/15/19   Rudene Re, MD  apixaban (ELIQUIS) 5 MG TABS tablet Take 5 mg by mouth 2 (two) times daily.    [provider]  atorvastatin (LIPITOR) 20 MG tablet Take 20 mg by mouth daily at 6 PM.     [provider]  carvedilol (COREG) 3.125 MG tablet Take 1 tablet (3.125 mg total) by mouth 2 (two) times daily with a meal. 01/01/19   Nicole Kindred A, DO  FLUoxetine (PROZAC) 40 MG capsule Take 40 mg by mouth daily.     [provider]  furosemide (LASIX) 40 MG tablet Take 1 tablet (40 mg total) by mouth daily. 01/21/19 03/22/19  Lavina Hamman, MD  loratadine (CLARITIN) 10 MG tablet Take 1 tablet (10 mg total) by mouth daily. Patient taking differently: Take 10 mg by mouth daily as needed for itching.  01/02/19   Ezekiel Slocumb, DO  nitroGLYCERIN (NITROSTAT) 0.4 MG SL tablet Place 1 tablet (0.4 mg total) under the tongue every 5 (five) minutes as needed for chest pain. 01/01/19   Ezekiel Slocumb, DO  spironolactone (ALDACTONE) 25 MG tablet Take 0.5 tablets (12.5 mg total) by mouth daily. 01/02/19   Ezekiel Slocumb, DO  traZODone (DESYREL) 100 MG tablet Take 150 mg by mouth at bedtime as needed for sleep.  09/08/13   [provider]    Allergies Penicillins and Ace inhibitors  Family History  Problem Relation Age of Onset  . Breast cancer Neg Hx     Social History Social History   Tobacco Use  . Smoking status: Current Every Day Smoker  . Smokeless tobacco: Never Used  Substance Use Topics  . Alcohol use: No    Alcohol/week: 0.0 standard drinks  . Drug use: Yes    Types: Cocaine    Review of Systems  Constitutional: No fever. Eyes: No redness. ENT: No sore throat. Cardiovascular: Positive for chest discomfort. Respiratory: Positive for shortness of breath. Gastrointestinal: Positive for nausea and vomiting.  No diarrhea.  Genitourinary: Negative for dysuria.  Musculoskeletal: Negative for back pain. Skin: Negative for  rash. Neurological: Negative for headache.   ____________________________________________   PHYSICAL EXAM:  VITAL SIGNS: ED Triage Vitals  Enc Vitals Group     BP 02/13/19 0715 (!) 137/91     Pulse Rate 02/13/19 0715 70     Resp 02/13/19 0715 20     Temp 02/13/19 0715 97.6 F (36.4 C)     Temp src --      SpO2 02/13/19 0715 100 %     Weight 02/13/19 0709 213 lb (96.6 kg)     Height 02/13/19 0709 5\' 6"  (1.676 m)     Head Circumference --      Peak Flow --      Pain Score 02/13/19 0709 8     Pain Loc --      Pain Edu? --      Excl. in [place]? --     Constitutional: Alert and oriented.  Uncomfortable appearing but in no acute distress. Eyes: Conjunctivae are normal.  No scleral icterus. Head: Atraumatic. Nose: No congestion/rhinnorhea. Mouth/Throat: Mucous membranes are moist.   Neck: Normal range of motion.  Cardiovascular: Normal rate, regular rhythm. Grossly normal heart sounds.  Good peripheral circulation. Respiratory: Slightly increased respiratory effort.  No retractions. Lungs CTAB. Gastrointestinal: Soft and nontender. No distention.  Genitourinary: No flank tenderness. Musculoskeletal:  Extremities warm and well perfused.  Neurologic:  Normal speech and language. No gross focal neurologic deficits are appreciated.  Skin:  Skin is warm and dry. No rash noted. Psychiatric: Mood and affect are normal. Speech and behavior are normal.  ____________________________________________   LABS (all labs ordered are listed, but only abnormal results are displayed)  Labs Reviewed  BASIC METABOLIC PANEL - Abnormal; Notable for the following components:      Result Value   Glucose, Bld 136 (*)    BUN 33 (*)    Creatinine, Ser 2.03 (*)    GFR calc non Af Amer 25 (*)    GFR calc Af Amer 29 (*)    All other components within normal limits  CBC - Abnormal; Notable for the following components:   RDW 16.5 (*)    All other components within normal limits  BRAIN NATRIURETIC  PEPTIDE - Abnormal; Notable for the following components:   B Natriuretic Peptide 2,135.0 (*)    All other components within normal limits  HEPATIC FUNCTION PANEL - Abnormal; Notable for the following components:   Albumin 3.4 (*)    AST 65 (*)    ALT 48 (*)    All other components within normal limits  TROPONIN I (HIGH SENSITIVITY) - Abnormal; Notable for the following components:   Troponin I (High Sensitivity) 60 (*)    All other components within normal limits  TROPONIN I (HIGH SENSITIVITY) - Abnormal; Notable for the following components:   Troponin I (High Sensitivity) 56 (*)    All other components within normal limits  PROTIME-INR  LIPASE, BLOOD   ____________________________________________  EKG  ED ECG REPORT I, Arta Silence, the attending physician, personally viewed and interpreted this ECG.  Date: 02/13/2019 EKG Time: 07 11 Rate: 86 Rhythm: normal sinus rhythm QRS Axis: Left axis Intervals: RBBB ST/T Wave abnormalities: LVH with repolarization abnormality Narrative Interpretation: Nonspecific abnormalities with no evidence of acute ischemia; no significant change when compared to EKG of 02/11/2019  ____________________________________________  RADIOLOGY  CXR: Persistent cardiac enlargement and mild edema unchanged from prior CT abdomen: No acute abnormality ____________________________________________   PROCEDURES  Procedure(s) performed: No  Procedures  Critical Care performed: No ____________________________________________   INITIAL IMPRESSION / ASSESSMENT AND PLAN / ED COURSE  Pertinent labs & imaging results that were available during my care of the patient were reviewed by me and considered in my medical decision making (see chart for details).  64 year old female with PMH as noted above including CHF presents with primarily nausea and vomiting since last night along with crampy diffuse abdominal pain, and some chest tightness.  I  reviewed the past medical records in [place].  The patient was just admitted at the end of last month with a CHF exacerbation after presenting with worsening shortness of breath and improved with IV diuretics.  She was also seen in the ED 2 days ago with worsening shortness of breath and chest discomfort and a reassuring work-up.  On exam today, her vital signs are normal.  O2 saturation is in the high 90s on room air.  She appears uncomfortable, but her lungs are clear and the abdomen is soft and nontender.  Overall presentation is most consistent with GI etiology such as gastroenteritis, foodborne illness, gastritis, medication side effect, or side effects from substance abuse.  It appears that the shortness of breath and chest tightness are secondary to the nausea and vomiting, however given the patient's history I cannot rule out worsening CHF or ACS.  The patient's x-ray is unchanged from prior, her BNP is similar to her visit from a few days ago, but her troponin is more elevated.  Her other lab work-up is within normal limits.  We will treat symptomatically with Zofran and Bentyl, and I will give an IV dose of Lasix.  If the patient continues to have significant abdominal discomfort I may consider additional imaging.    ----------------------------------------- 2:12 PM on 02/13/2019 -----------------------------------------  The patient initially had good relief with Zofran and Bentyl, but then reported recurrent pain and states that she vomited a few times.  I obtained a CT, which showed no significant acute findings.  Given the worsened troponin and continued very high BNP as well as the patient's continued discomfort and difficulty with p.o., I will admit her.  I discussed her case with Dr. Manuella Ghazi from the hospitalist service.  ____________________________________________   FINAL CLINICAL IMPRESSION(S) / ED DIAGNOSES  Final diagnoses:  Nausea and vomiting, intractability of vomiting not  specified, unspecified vomiting type  Acute on chronic congestive heart failure, unspecified heart failure type ([place])      NEW MEDICATIONS STARTED DURING THIS VISIT:  New Prescriptions   No medications on file     Note:  This document was prepared using Dragon voice recognition software and may include unintentional dictation errors.   Arta Silence, MD 02/13/19 [phone]

## 2019-02-13 NOTE — ED Notes (Signed)
Pt standing in doorway staring at nurses station. Pt notified that meal tray is requested and en route per dietary. Agricultural consultant notified.

## 2019-02-13 NOTE — ED Triage Notes (Signed)
Pt to ER with c/o chest tightness.  PT seen in last 2 days for same.  Pt well known to this facility for similar complaints.

## 2019-02-13 NOTE — ED Notes (Signed)
Report to Joy, RN

## 2019-02-14 DIAGNOSIS — R1084 Generalized abdominal pain: Secondary | ICD-10-CM

## 2019-02-14 DIAGNOSIS — R197 Diarrhea, unspecified: Secondary | ICD-10-CM

## 2019-02-14 LAB — BASIC METABOLIC PANEL
Anion gap: 9 (ref 5–15)
BUN: 33 mg/dL — ABNORMAL HIGH (ref 8–23)
CO2: 25 mmol/L (ref 22–32)
Calcium: 9 mg/dL (ref 8.9–10.3)
Chloride: 105 mmol/L (ref 98–111)
Creatinine, Ser: 2.05 mg/dL — ABNORMAL HIGH (ref 0.44–1.00)
GFR calc Af Amer: 29 mL/min — ABNORMAL LOW (ref >60)
GFR calc non Af Amer: 25 mL/min — ABNORMAL LOW (ref >60)
Glucose, Bld: 155 mg/dL — ABNORMAL HIGH (ref 70–99)
Potassium: 3.5 mmol/L (ref 3.5–5.1)
Sodium: 139 mmol/L (ref 135–145)

## 2019-02-14 LAB — URINE DRUG SCREEN, QUALITATIVE (ARMC ONLY)
Amphetamines, Ur Screen: NOT DETECTED
Barbiturates, Ur Screen: NOT DETECTED
Benzodiazepine, Ur Scrn: POSITIVE — AB
Cannabinoid 50 Ng, Ur ~~LOC~~: NOT DETECTED
Cocaine Metabolite,Ur ~~LOC~~: POSITIVE — AB
MDMA (Ecstasy)Ur Screen: NOT DETECTED
Methadone Scn, Ur: NOT DETECTED
Opiate, Ur Screen: NOT DETECTED
Phencyclidine (PCP) Ur S: NOT DETECTED
Tricyclic, Ur Screen: NOT DETECTED

## 2019-02-14 MED ORDER — DICYCLOMINE HCL 20 MG PO TABS
20.0000 mg | ORAL_TABLET | Freq: Three times a day (TID) | ORAL | Status: DC
  Administered 2019-02-14 – 2019-02-16 (×9): 20 mg via ORAL
  Filled 2019-02-14 (×11): qty 1

## 2019-02-14 MED ORDER — METOCLOPRAMIDE HCL 10 MG PO TABS
5.0000 mg | ORAL_TABLET | Freq: Three times a day (TID) | ORAL | Status: DC
  Administered 2019-02-14 – 2019-02-16 (×7): 5 mg via ORAL
  Filled 2019-02-14 (×7): qty 1

## 2019-02-14 MED ORDER — NICOTINE 21 MG/24HR TD PT24
21.0000 mg | MEDICATED_PATCH | Freq: Every day | TRANSDERMAL | Status: DC
  Administered 2019-02-14 – 2019-02-16 (×3): 21 mg via TRANSDERMAL
  Filled 2019-02-14 (×3): qty 1

## 2019-02-14 MED ORDER — ALUM & MAG HYDROXIDE-SIMETH 200-200-20 MG/5ML PO SUSP
30.0000 mL | ORAL | Status: DC | PRN
  Administered 2019-02-15 (×2): 30 mL via ORAL
  Filled 2019-02-14 (×2): qty 30

## 2019-02-14 NOTE — Plan of Care (Signed)
  Problem: Education: Goal: Knowledge of General Education information will improve Description: Including pain rating scale, medication(s)/side effects and non-pharmacologic comfort measures Outcome: Progressing   Problem: Health Behavior/Discharge Planning: Goal: Ability to manage health-related needs will improve Outcome: Not Progressing Note: Patient still complaining of generalized abdominal pain today, patient started on both PO Bentyl and Reglan. Will continue to monitor GI status for the remainder of the shift. Wenda Low [facility]

## 2019-02-14 NOTE — Progress Notes (Signed)
PROGRESS NOTE    Katelyn Lane  [id number] DOB: 07-Sep-1955 DOA: 02/13/2019 PCP: [name], MD    Brief Narrative: Katelyn Lane  is a 64 y.o. female with a known history of CHF, dilated cardiomyopathy, hypertension as well as substance abuse who presents with primarily nausea and vomiting since last night, associated with crampy diffuse abdominal discomfort, but not associated with diarrhea or fever. The patient also reports some increased shortness of breath and chest tightness. She reports a trace amount of blood in her stool which she believes is related to the Eliquis.   She was also seen in the ED 2 days ago with worsening shortness of breath and chest discomfort and was D/C with normal work up.  The patient's x-ray is unchanged from prior, her BNP is similar to her visit from a few days ago, but her troponin is more elevated. Her other lab work-up is within normal limits.  She was treated symptomatically with Zofran, Bentyl, and an IV dose of Lasix. The patient initially had good relief with Zofran and Bentyl, but then reported recurrent pain and states that she vomited a few times. EDP obtained a CT, which showed no significant acute findings.  2/6: Patient seen and examined.  Somewhat lethargic and sleepy this morning but easily arousable and answers all questions appropriately.  Diuresing well, appears euvolemic.  Continues to endorse severe abdominal pain however also tells me that she is doing well with liquids and would like to try more solid foods.    Assessment & Plan:   Active Problems:   CHF exacerbation ([place]) 64 y.o.femalewith medical history significant forHFrEF/dilated cardiomyopathy with EF 15%, hypertension, hyperlipidemia, CKD stage III, depression, and substance use (tobacco/cocaine) who presents to the ED for N/V/Abd pain and SOB  N/V/Abd pain Unclear etiology CT abdomen pelvis negative for acute pathology UDS is positive for cocaine and  benzodiazepines, unclear what role this plays Working diagnosis viral gastroenteritis Plan: Bentyl 20 mg p.o. 4 times daily scheduled Reglan 5 mg 3 times daily AC scheduled As needed Maalox Avoid narcotics Advance to heart healthy diet  Chronic systolic CHF exacerbation Hx ofdilated cardiomyopathy: Echocardiogram 12/29/2018 showed [id number], grade 3 diastolic dysfunction, global LV hypokinesis. Right/left heart cath 12/31/2018 showed severe dilated cardiomyopathy with EF 15% and minimal atherosclerosis of left circumflex and right coronary. --Pt presented with dyspnea, BNP 2135.  - based on my clinical exam - she looks euvolemic, her creat is also on higher than before so I will hold off agressive diuresis. For now, keep her on lasix 20 mg IV daily. May need to hold lasix if creat worsen. 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 Monitor while on small dose of lasix.  Anemia: c-scopy in 2017 and was pos for only polyps. EGD: never.  Hypertension: Continue home blood pressure medications  Hyperlipidemia: atorvastatin 40 mg daily.  Depression: Continue fluoxetine, trazodone. Needed one time dose of xanax as she was very anxious  Tobacco use: Patient counseled on smoking cessation.  Substance use: Check UDS Positive for cocaine and benzodiazepines Educated patient TOC consult    DVT prophylaxis: Eliquis Code Status: Full Family Communication: None Disposition Plan: Anticipate home, 24 to 48 hours.  Pending resolution of abdominal pain and tolerance of p.o. intake   Consultants:   None  Procedures:  None  Antimicrobials:   None  Subjective: Patient seen and examined No acute events overnight No new complaints Continues to endorse nausea and abdominal pain  Objective: Vitals:   02/14/19  0339 02/14/19 0737 02/14/19 0738 02/14/19 1212  BP: 112/89 (!) 84/52 (!) 114/93 94/76  Pulse: 70 69 72 75  Resp: 20 18  18   Temp: (!) 97.4 F (36.3 C) 97.9 F (36.6 C)  97.9 F (36.6 C)  TempSrc: Oral Oral    SpO2: 96% 98%  (!) 72%  Weight: 97.1 kg     Height:        Intake/Output Summary (Last 24 hours) at 02/14/2019 1404 Last data filed at 02/14/2019 0341 Gross per 24 hour  Intake 780 ml  Output 800 ml  Net -20 ml   Filed Weights   02/13/19 0709 02/13/19 1706 02/14/19 0339  Weight: 96.6 kg 97.5 kg 97.1 kg    Examination:  General exam: Appears calm and comfortable  Respiratory system: Clear to auscultation. Respiratory effort normal. Cardiovascular system: S1 & S2 heard, RRR. No JVD, murmurs, rubs, gallops or clicks. No pedal edema. Gastrointestinal system: Abdomen is nondistended, soft and nontender. No organomegaly or masses felt. Normal bowel sounds heard. Central nervous system: Alert and oriented. No focal neurological deficits. Extremities: Symmetric 5 x 5 power. Skin: No rashes, lesions or ulcers Psychiatry: Judgement and insight appear normal. Mood & affect appropriate.     Data Reviewed: I have personally reviewed following labs and imaging studies  CBC: Recent Labs  Lab 02/11/19 2017 02/13/19 0716  WBC 5.1 4.7  NEUTROABS 3.0  --   HGB 11.7* 12.2  HCT 38.8 40.3  MCV 85.5 85.9  PLT 352 [id number]   Basic Metabolic Panel: Recent Labs  Lab 02/11/19 2017 02/13/19 0716 02/14/19 0510  NA 142 144 139  K 4.0 3.8 3.5  CL 110 110 105  CO2 24 24 25   GLUCOSE 111* 136* 155*  BUN 31* 33* 33*  CREATININE 2.24* 2.03* 2.05*  CALCIUM 9.0 9.4 9.0   GFR: Estimated Creatinine Clearance: 33 mL/min (A) (by C-G formula based on SCr of 2.05 mg/dL (H)). Liver Function Tests: Recent Labs  Lab 02/11/19 2017 02/13/19 0716  AST 41 65*  ALT 37 48*  ALKPHOS 93 108  BILITOT 0.6 0.8  PROT 6.7 7.1  ALBUMIN 3.3* 3.4*   Recent Labs  Lab 02/13/19 0716  LIPASE 36   No  results for input(s): AMMONIA in the last 168 hours. Coagulation Profile: Recent Labs  Lab 02/13/19 0716  INR 1.1   Cardiac Enzymes: No results for input(s): CKTOTAL, CKMB, CKMBINDEX, TROPONINI in the last 168 hours. BNP (last 3 results) No results for input(s): PROBNP in the last 8760 hours. HbA1C: No results for input(s): HGBA1C in the last 72 hours. CBG: No results for input(s): GLUCAP in the last 168 hours. Lipid Profile: No results for input(s): CHOL, HDL, LDLCALC, TRIG, CHOLHDL, LDLDIRECT in the last 72 hours. Thyroid Function Tests: No results for input(s): TSH, T4TOTAL, FREET4, T3FREE, THYROIDAB in the last 72 hours. Anemia Panel: No results for input(s): VITAMINB12, FOLATE, FERRITIN, TIBC, IRON, RETICCTPCT in the last 72 hours. Sepsis Labs: No results for input(s): PROCALCITON, LATICACIDVEN in the last 168 hours.  Recent Results (from the past 240 hour(s))  SARS CORONAVIRUS 2 (TAT 6-24 HRS) Nasopharyngeal Nasopharyngeal Swab     Status: None   Collection Time: 02/13/19  4:04 PM   Specimen: Nasopharyngeal Swab  Result Value Ref Range Status   SARS Coronavirus 2 NEGATIVE NEGATIVE Final  Comment: (NOTE) SARS-CoV-2 target nucleic acids are NOT DETECTED. The SARS-CoV-2 RNA is generally detectable in upper and lower respiratory specimens during the acute phase of infection. Negative results do not preclude SARS-CoV-2 infection, do not rule out co-infections with other pathogens, and should not be used as the sole basis for treatment or other patient management decisions. Negative results must be combined with clinical observations, patient history, and epidemiological information. The expected result is Negative. Fact Sheet for Patients: SugarRoll.be Fact Sheet for Healthcare Providers: https://www.woods-mathews.com/ This test is not yet approved or cleared by the [place] FDA and  has been authorized for detection and/or  diagnosis of SARS-CoV-2 by FDA under an Emergency Use Authorization (EUA). This EUA will remain  in effect (meaning this test can be used) for the duration of the COVID-19 declaration under Section 56 4(b)(1) of the Act, 21 U.S.C. section 360bbb-3(b)(1), unless the authorization is terminated or revoked sooner. Performed at [facility], [place] [address], [place], [place] [address]          Radiology Studies: CT ABDOMEN PELVIS WO CONTRAST  Result Date: 02/13/2019 CLINICAL DATA:  Generalized abdominal pain. EXAM: CT ABDOMEN AND PELVIS WITHOUT CONTRAST TECHNIQUE: Multidetector CT imaging of the abdomen and pelvis was performed following the standard protocol without IV contrast. COMPARISON:  08/07/2009 FINDINGS: Lower chest: The lung bases are clear of an acute process. There is dependent bibasilar atelectasis but no pleural effusion. The heart is enlarged. No pericardial effusion. Hepatobiliary: No obvious hepatic lesions without contrast. No intra or extrahepatic biliary dilatation. The gallbladder appears normal. Pancreas: No obvious mass, inflammation or ductal dilatation. Spleen: Normal size. No focal lesions. Adrenals/Urinary Tract: The adrenal glands are unremarkable. Stable renal cortical scarring changes or fetal lobulations but no worrisome renal lesions, hydronephrosis or obstructing ureteral calculi. No obvious bladder lesions. Stomach/Bowel: The stomach, duodenum, small bowel and colon are grossly normal without oral contrast. No acute inflammatory changes, mass lesions or obstructive findings. The terminal ileum and appendix are normal. Moderate scattered descending and sigmoid colon diverticulosis without findings for acute diverticulitis. Vascular/Lymphatic: Scattered atherosclerotic calcifications involving the abdominal aorta and iliac arteries. No focal aneurysm. No mesenteric or retroperitoneal mass or adenopathy. Reproductive: The uterus and ovaries are surgically absent.  Moderate air is noted in the vagina which could be due to recent exam. I do not see any definite findings for a rectovaginal fistula. Other: No pelvic mass or adenopathy. No free pelvic fluid collections. No inguinal mass or adenopathy. No abdominal wall hernia or subcutaneous lesions. Musculoskeletal: No significant bony findings. IMPRESSION: 1. No acute abdominal/pelvic findings, mass lesions or adenopathy. 2. Stable renal cortical scarring changes or fetal lobulations. No obstructing ureteral calculi or bladder calculi. 3. Moderate air in the vagina could be due to recent pelvic exam. No definite findings for a rectovaginal fistula. 4. Stable cardiac enlargement. Aortic Atherosclerosis (ICD10-I70.0). Electronically Signed   By: Marijo Sanes M.D.   On: 02/13/2019 12:48   DG Chest 2 View  Result Date: 02/13/2019 CLINICAL DATA:  Shortness of breath. EXAM: CHEST - 2 VIEW COMPARISON:  Chest x-ray 02/11/2019 FINDINGS: Stable cardiac enlargement. Mild vascular congestion and persistent mild interstitial edema. No definite pleural effusions or focal infiltrates. IMPRESSION: Persistent cardiac enlargement, vascular congestion and mild interstitial edema. Electronically Signed   By: Marijo Sanes M.D.   On: 02/13/2019 07:49        Scheduled Meds:  apixaban  5 mg Oral BID   atorvastatin  20 mg Oral q1800  carvedilol  3.125 mg Oral BID WC   dicyclomine  20 mg Oral TID AC & HS   FLUoxetine  40 mg Oral Daily   furosemide  20 mg Intravenous Daily   metoCLOPramide  5 mg Oral TID AC   nicotine  21 mg Transdermal Daily   sodium chloride flush  3 mL Intravenous Q12H   spironolactone  12.5 mg Oral Daily   Continuous Infusions:  sodium chloride       LOS: 1 day    Time spent: 35 minutes    Sidney Ace, MD [facility] Pager [phone]  If 7PM-7AM, please contact night-coverage  02/14/2019, 2:04 PM

## 2019-02-15 LAB — BASIC METABOLIC PANEL
Anion gap: 9 (ref 5–15)
BUN: 47 mg/dL — ABNORMAL HIGH (ref 8–23)
CO2: 24 mmol/L (ref 22–32)
Calcium: 9.3 mg/dL (ref 8.9–10.3)
Chloride: 108 mmol/L (ref 98–111)
Creatinine, Ser: 2.24 mg/dL — ABNORMAL HIGH (ref 0.44–1.00)
GFR calc Af Amer: 26 mL/min — ABNORMAL LOW (ref >60)
GFR calc non Af Amer: 23 mL/min — ABNORMAL LOW (ref >60)
Glucose, Bld: 113 mg/dL — ABNORMAL HIGH (ref 70–99)
Potassium: 4.1 mmol/L (ref 3.5–5.1)
Sodium: 141 mmol/L (ref 135–145)

## 2019-02-15 MED ORDER — PANTOPRAZOLE SODIUM 40 MG PO TBEC
40.0000 mg | DELAYED_RELEASE_TABLET | Freq: Every day | ORAL | Status: DC
  Administered 2019-02-15 – 2019-02-16 (×2): 40 mg via ORAL
  Filled 2019-02-15 (×2): qty 1

## 2019-02-15 NOTE — Progress Notes (Signed)
PROGRESS NOTE    Katelyn Lane  [id number] DOB: [birth year] DOA: 02/13/2019 PCP: [name], MD    Brief Narrative: Katelyn Lane  is a 64 y.o. female with a known history of CHF, dilated cardiomyopathy, hypertension as well as substance abuse who presents with primarily nausea and vomiting since last night, associated with crampy diffuse abdominal discomfort, but not associated with diarrhea or fever. The patient also reports some increased shortness of breath and chest tightness. She reports a trace amount of blood in her stool which she believes is related to the Eliquis.   She was also seen in the ED 2 days ago with worsening shortness of breath and chest discomfort and was D/C with normal work up.  The patient's x-ray is unchanged from prior, her BNP is similar to her visit from a few days ago, but her troponin is more elevated. Her other lab work-up is within normal limits.  She was treated symptomatically with Zofran, Bentyl, and an IV dose of Lasix. The patient initially had good relief with Zofran and Bentyl, but then reported recurrent pain and states that she vomited a few times. EDP obtained a CT, which showed no significant acute findings.  2/6: Patient seen and examined.  Somewhat lethargic and sleepy this morning but easily arousable and answers all questions appropriately.  Diuresing well, appears euvolemic.  Continues to endorse severe abdominal pain however also tells me that she is doing well with liquids and would like to try more solid foods.  2/7: Patient seen and examined.  More awake this morning.  Abdominal pain improved however continues to endorse residual pain, nausea, poor p.o. intake.  Net -3.2 L since admission.  At or approaching euvolemia.    Assessment & Plan:   Active Problems:   CHF exacerbation ([place]) 64 y.o.femalewith medical history significant forHFrEF/dilated cardiomyopathy with EF 15%, hypertension, hyperlipidemia, CKD stage  III, depression, and substance use (tobacco/cocaine) who presents to the ED for N/V/Abd pain and SOB  N/V/Abd pain Unclear etiology CT abdomen pelvis negative for acute pathology UDS is positive for cocaine and benzodiazepines, unclear what role this plays Working diagnosis viral gastroenteritis Plan: Bentyl 20 mg p.o. 4 times daily scheduled Reglan 5 mg 3 times daily AC scheduled As needed Maalox Avoid narcotics Advance diet as tolerated Patient tolerating p.o. diet and creatinine improved tomorrow consider discharge home  Chronic systolic CHF exacerbation Hx ofdilated cardiomyopathy: Echocardiogram 12/29/2018 showed [id number], grade 3 diastolic dysfunction, global LV hypokinesis. Right/left heart cath 12/31/2018 showed severe dilated cardiomyopathy with EF 15% and minimal atherosclerosis of left circumflex and right coronary. --Pt presented with dyspnea, BNP 2135.  -Was receiving 20 mg IV Lasix daily patient appears to be her home dose (40mg  PO) -Creatinine worsening currently at 2.24.  Baseline creatinine appears to be approximately 1.8-1.9 -Hold Lasix, ambulate patient, recheck creatinine in a.m.  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 EGD: never.  Hypertension: Continue home blood pressure medications  Hyperlipidemia: atorvastatin 40 mg daily.  Depression: Continue fluoxetine, trazodone. Needed one time dose of xanax as she was very anxious  Tobacco use: Patient counseled on smoking cessation.  Substance use: Check UDS Positive for cocaine and benzodiazepines Educated patient TOC consult    DVT  prophylaxis: Eliquis Code Status: Full Family  Communication: None Disposition Plan: Anticipate home, 24 to 48 hours.  Pending resolution of abdominal pain and tolerance of p.o. intake   Consultants:   None  Procedures:  None  Antimicrobials:   None   Subjective: Patient seen and examined No acute events overnight Abdominal pain improved Seems to be tolerating some p.o.  Objective: Vitals:   02/15/19 0451 02/15/19 0733 02/15/19 0734 02/15/19 1249  BP: 113/87 (!) 134/113 114/88 113/75  Pulse: 66 71 81 68  Resp: 19 18  18   Temp: (!) 97.5 F (36.4 C) (!) 97.5 F (36.4 C)  98.3 F (36.8 C)  TempSrc: Oral Oral    SpO2: 98% 98%  100%  Weight: 99.3 kg     Height:        Intake/Output Summary (Last 24 hours) at 02/15/2019 1408 Last data filed at 02/15/2019 1256 Gross per 24 hour  Intake --  Output 2250 ml  Net -2250 ml   Filed Weights   02/13/19 1706 02/14/19 0339 02/15/19 0451  Weight: 97.5 kg 97.1 kg 99.3 kg    Examination:  General exam: Appears calm and comfortable  Respiratory system: Clear to auscultation. Respiratory effort normal. Cardiovascular system: S1 & S2 heard, RRR. No JVD, murmurs, rubs, gallops or clicks. No pedal edema. Gastrointestinal system: Abdomen is nondistended, soft and nontender. No organomegaly or masses felt. Normal bowel sounds heard. Central nervous system: Alert and oriented. No focal neurological deficits. Extremities: Symmetric 5 x 5 power. Skin: No rashes, lesions or ulcers Psychiatry: Judgement and insight appear normal. Mood & affect appropriate.     Data Reviewed: I have personally reviewed following labs and imaging studies  CBC: Recent Labs  Lab 02/11/19 2017 02/13/19 0716  WBC 5.1 4.7  NEUTROABS 3.0  --   HGB 11.7* 12.2  HCT 38.8 40.3  MCV 85.5 85.9  PLT 352 [id number]   Basic Metabolic Panel: Recent Labs  Lab 02/11/19 2017 02/13/19 0716 02/14/19 0510 02/15/19 0356  NA 142 144 139 141  K 4.0 3.8 3.5 4.1  CL 110 110 105 108  CO2 24 24 25 24   GLUCOSE 111*  136* 155* 113*  BUN 31* 33* 33* 47*  CREATININE 2.24* 2.03* 2.05* 2.24*  CALCIUM 9.0 9.4 9.0 9.3   GFR: Estimated Creatinine Clearance: 30.6 mL/min (A) (by C-G formula based on SCr of 2.24 mg/dL (H)). Liver Function Tests: Recent Labs  Lab 02/11/19 2017 02/13/19 0716  AST 41 65*  ALT 37 48*  ALKPHOS 93 108  BILITOT 0.6 0.8  PROT 6.7 7.1  ALBUMIN 3.3* 3.4*   Recent Labs  Lab 02/13/19 0716  LIPASE 36   No results for input(s): AMMONIA in the last 168 hours. Coagulation Profile: Recent Labs  Lab 02/13/19 0716  INR 1.1   Cardiac Enzymes: No results for input(s): CKTOTAL, CKMB, CKMBINDEX, TROPONINI in the last 168 hours. BNP (last 3 results) No results for input(s): PROBNP in the last 8760 hours. HbA1C: No results for input(s): HGBA1C in the last 72 hours. CBG: No results for input(s): GLUCAP in the last 168 hours. Lipid Profile: No results for input(s): CHOL, HDL, LDLCALC, TRIG, CHOLHDL, LDLDIRECT in the last 72 hours. Thyroid Function Tests: No results for input(s): TSH, T4TOTAL, FREET4, T3FREE, THYROIDAB in the last 72 hours. Anemia Panel: No results for input(s): VITAMINB12, FOLATE, FERRITIN, TIBC, IRON, RETICCTPCT in the last 72 hours. Sepsis Labs: No results for input(s): PROCALCITON, LATICACIDVEN in the last 168 hours.  Recent Results (  from the past 240 hour(s))  SARS CORONAVIRUS 2 (TAT 6-24 HRS) Nasopharyngeal Nasopharyngeal Swab     Status: None   Collection Time: 02/13/19  4:04 PM   Specimen: Nasopharyngeal Swab  Result Value Ref Range Status   SARS Coronavirus 2 NEGATIVE NEGATIVE Final    Comment: (NOTE) SARS-CoV-2 target nucleic acids are NOT DETECTED. The SARS-CoV-2 RNA is generally detectable in upper and lower respiratory specimens during the acute phase of infection. Negative results do not preclude SARS-CoV-2 infection, do not rule out co-infections with other pathogens, and should not be used as the sole basis for treatment or other patient  management decisions. Negative results must be combined with clinical observations, patient history, and epidemiological information. The expected result is Negative. Fact Sheet for Patients: SugarRoll.be Fact Sheet for Healthcare Providers: https://www.woods-mathews.com/ This test is not yet approved or cleared by the [place] FDA and  has been authorized for detection and/or diagnosis of SARS-CoV-2 by FDA under an Emergency Use Authorization (EUA). This EUA will remain  in effect (meaning this test can be used) for the duration of the COVID-19 declaration under Section 56 4(b)(1) of the Act, 21 U.S.C. section 360bbb-3(b)(1), unless the authorization is terminated or revoked sooner. Performed at [facility], [place] [address] [place], [place] [address]          Radiology Studies: No results found.      Scheduled Meds: . apixaban  5 mg Oral BID  . atorvastatin  20 mg Oral q1800  . carvedilol  3.125 mg Oral BID WC  . dicyclomine  20 mg Oral TID AC & HS  . FLUoxetine  40 mg Oral Daily  . metoCLOPramide  5 mg Oral TID AC  . nicotine  21 mg Transdermal Daily  . sodium chloride flush  3 mL Intravenous Q12H  . spironolactone  12.5 mg Oral Daily   Continuous Infusions: . sodium chloride       LOS: 2 days    Time spent: 35 minutes    Sidney Ace, MD [facility] Pager [phone]  If 7PM-7AM, please contact night-coverage  02/15/2019, 2:08 PM

## 2019-02-15 NOTE — Evaluation (Signed)
Physical Therapy Evaluation Patient Details Name: Katelyn Lane MRN: [id number] DOB: Aug 08, 1955 Today's Date: 02/15/2019   History of Present Illness  Katelyn Lane  is a 64 y.o. female with a known history of CHF, dilated cardiomyopathy, hypertension as well as substance abuse who presents with primarily nausea and vomiting since last night, associated with crampy diffuse abdominal discomfort, but not associated with diarrhea or fever.  The patient also reports some increased shortness of breath and chest tightness. She was also seen in the ED 2 days ago with worsening shortness of breath and chest discomfort and was D/C with normal work up.  Clinical Impression  Pt is a pleasant 64 year old F who was admitted for above diagnosis. Per RN, pt has been able to perform all mobility independently, including ambulating to bathroom multiple times. Pt reports she has ambulated around nursing station as well independently multiple times, and feels she is at her baseline mobility level. Most recent PT eval from 01/18/2019 notes pt's independence with mobility at that time as well.  Patient does not demonstrate the need for PT intervention at this time. Will DC from caseload at this time.      Follow Up Recommendations No PT follow up    Equipment Recommendations       Recommendations for Other Services       Precautions / Restrictions        Mobility  Bed Mobility                  Transfers                    Ambulation/Gait                Stairs            Wheelchair Mobility    Modified Rankin (Stroke Patients Only)       Balance                                             Pertinent Vitals/Pain      Home Living                        Prior Function                 Hand Dominance        Extremity/Trunk Assessment                Communication      Cognition                                              General Comments      Exercises     Assessment/Plan    PT Assessment    PT Problem List         PT Treatment Interventions      PT Goals (Current goals can be found in the Care Plan section)       Frequency     Barriers to discharge        Co-evaluation               AM-PAC PT "6 Clicks" Mobility  Outcome Measure  End of Session       Nurse Communication: Mobility status;Other (comment)(DC from PT services)      Time:  -      Charges:              Petra Kuba, PT, DPT 02/15/19, 4:47 PM.

## 2019-02-15 NOTE — Progress Notes (Addendum)
Made aware by ccmd patient had 7 beat run vtach, upon entering room patient resting in the bed no c/o pain, does c/o chest tightness. Made dr. Vic Blackbird aware

## 2019-02-16 DIAGNOSIS — N183 Chronic kidney disease, stage 3 unspecified: Secondary | ICD-10-CM

## 2019-02-16 DIAGNOSIS — N179 Acute kidney failure, unspecified: Secondary | ICD-10-CM

## 2019-02-16 DIAGNOSIS — M6282 Rhabdomyolysis: Secondary | ICD-10-CM | POA: Diagnosis present

## 2019-02-16 DIAGNOSIS — Z720 Tobacco use: Secondary | ICD-10-CM | POA: Diagnosis present

## 2019-02-16 DIAGNOSIS — R109 Unspecified abdominal pain: Secondary | ICD-10-CM | POA: Diagnosis present

## 2019-02-16 DIAGNOSIS — R112 Nausea with vomiting, unspecified: Secondary | ICD-10-CM

## 2019-02-16 DIAGNOSIS — F1490 Cocaine use, unspecified, uncomplicated: Secondary | ICD-10-CM | POA: Diagnosis present

## 2019-02-16 DIAGNOSIS — I509 Heart failure, unspecified: Secondary | ICD-10-CM

## 2019-02-16 DIAGNOSIS — I5023 Acute on chronic systolic (congestive) heart failure: Secondary | ICD-10-CM | POA: Diagnosis present

## 2019-02-16 LAB — CBC
HCT: 37.4 % (ref 36.0–46.0)
Hemoglobin: 11.3 g/dL — ABNORMAL LOW (ref 12.0–15.0)
MCH: 25.7 pg — ABNORMAL LOW (ref 26.0–34.0)
MCHC: 30.2 g/dL (ref 30.0–36.0)
MCV: 85.2 fL (ref 80.0–100.0)
Platelets: 298 10*3/uL (ref 150–400)
RBC: 4.39 MIL/uL (ref 3.87–5.11)
RDW: 15.9 % — ABNORMAL HIGH (ref 11.5–15.5)
WBC: 4.2 10*3/uL (ref 4.0–10.5)
nRBC: 0 % (ref 0.0–0.2)

## 2019-02-16 LAB — BASIC METABOLIC PANEL
Anion gap: 9 (ref 5–15)
BUN: 49 mg/dL — ABNORMAL HIGH (ref 8–23)
CO2: 23 mmol/L (ref 22–32)
Calcium: 9 mg/dL (ref 8.9–10.3)
Chloride: 104 mmol/L (ref 98–111)
Creatinine, Ser: 2.15 mg/dL — ABNORMAL HIGH (ref 0.44–1.00)
GFR calc Af Amer: 28 mL/min — ABNORMAL LOW (ref >60)
GFR calc non Af Amer: 24 mL/min — ABNORMAL LOW (ref >60)
Glucose, Bld: 126 mg/dL — ABNORMAL HIGH (ref 70–99)
Potassium: 4.2 mmol/L (ref 3.5–5.1)
Sodium: 136 mmol/L (ref 135–145)

## 2019-02-16 MED ORDER — CARVEDILOL 3.125 MG PO TABS: 3.1250 mg | ORAL_TABLET | Freq: Two times a day (BID) | ORAL | 0 refills | Status: DC

## 2019-02-16 MED ORDER — DICYCLOMINE HCL 20 MG PO TABS: 20.0000 mg | ORAL_TABLET | Freq: Three times a day (TID) | ORAL | 0 refills | Status: DC

## 2019-02-16 MED ORDER — NITROGLYCERIN 0.4 MG SL SUBL: 0.4000 mg | SUBLINGUAL_TABLET | SUBLINGUAL | 1 refills | Status: DC | PRN

## 2019-02-16 MED ORDER — NICOTINE 21 MG/24HR TD PT24: 21.0000 mg | MEDICATED_PATCH | Freq: Every day | TRANSDERMAL | 0 refills | Status: DC

## 2019-02-16 MED ORDER — ALUM & MAG HYDROXIDE-SIMETH 200-200-20 MG/5ML PO SUSP: 30.0000 mL | ORAL | 0 refills | Status: DC | PRN

## 2019-02-16 NOTE — Discharge Instructions (Signed)
Heart Failure, Diagnosis  Heart failure means that your heart is not able to pump blood in the right way. This makes it hard for your body to work well. Heart failure is usually a long-term (chronic) condition. You must take good care of yourself and follow your treatment plan from your doctor. What are the causes? This condition may be caused by:  High blood pressure.  Build up of cholesterol and fat in the arteries.  Heart attack. This injures the heart muscle.  Heart valves that do not open and close properly.  Damage of the heart muscle. This is also called cardiomyopathy.  Lung disease.  Abnormal heart rhythms. What increases the risk? The risk of heart failure goes up as a person ages. This condition is also more likely to develop in people who:  Are overweight.  Are female.  Smoke or chew tobacco.  Abuse alcohol or illegal drugs.  Have taken medicines that can damage the heart.  Have diabetes.  Have abnormal heart rhythms.  Have thyroid problems.  Have low blood counts (anemia). What are the signs or symptoms? Symptoms of this condition include:  Shortness of breath.  Coughing.  Swelling of the feet, ankles, legs, or belly.  Losing weight for no reason.  Trouble breathing.  Waking from sleep because of the need to sit up and get more air.  Rapid heartbeat.  Being very tired.  Feeling dizzy, or feeling like you may pass out (faint).  Having no desire to eat.  Feeling like you may vomit (nauseous).  Peeing (urinating) more at night.  Feeling confused. How is this treated?     This condition may be treated with:  Medicines. These can be given to treat blood pressure and to make the heart muscles stronger.  Changes in your daily life. These may include eating a healthy diet, staying at a healthy body weight, quitting tobacco and illegal drug use, or doing exercises.  Surgery. Surgery can be done to open blocked valves, or to put  devices in the heart, such as pacemakers.  A donor heart (heart transplant). You will receive a healthy heart from a donor. Follow these instructions at home:  Treat other conditions as told by your doctor. These may include high blood pressure, diabetes, thyroid disease, or abnormal heart rhythms.  Learn as much as you can about heart failure.  Get support as you need it.  Keep all follow-up visits as told by your doctor. This is important. Summary  Heart failure means that your heart is not able to pump blood in the right way.  This condition is caused by high blood pressure, heart attack, or damage of the heart muscle.  Symptoms of this condition include shortness of breath and swelling of the feet, ankles, legs, or belly. You may also feel very tired or feel like you may vomit.  You may be treated with medicines, surgery, or changes in your daily life.  Treat other health conditions as told by your doctor. This information is not intended to replace advice given to you by your health care provider. Make sure you discuss any questions you have with your health care provider. Document Revised: 03/14/2018 Document Reviewed: 03/14/2018 Elsevier Patient Education  [place].   Heart Failure Eating Plan Heart failure, also called congestive heart failure, occurs when your heart does not pump blood well enough to meet your body's needs for oxygen-rich blood. Heart failure is a long-term (chronic) condition. Living with heart failure can  be challenging. However, following your health care provider's instructions about a healthy lifestyle and working with a diet and nutrition specialist (dietitian) to choose the right foods may help to improve your symptoms. What are tips for following this plan? Reading food labels  Check food labels for the amount of sodium per serving. Choose foods that have less than 140 mg (milligrams) of sodium in each serving.  Check food labels for the  number of calories per serving. This is important if you need to limit your daily calorie intake to lose weight.  Check food labels for the serving size. If you eat more than one serving, you will be eating more sodium and calories than what is listed on the label.  Look for foods that are labeled as "sodium-free," "very low sodium," or "low sodium." ? Foods labeled as "reduced sodium" or "lightly salted" may still have more sodium than what is recommended for you. Cooking  Avoid adding salt when cooking. Ask your health care provider or dietitian before using salt substitutes.  Season food with salt-free seasonings, spices, or herbs. Check the label of seasoning mixes to make sure they do not contain salt.  Cook with heart-healthy oils, such as olive, canola, soybean, or sunflower oil.  Do not fry foods. Cook foods using low-fat methods, such as baking, boiling, grilling, and broiling.  Limit unhealthy fats when cooking by: ? Removing the skin from poultry, such as chicken. ? Removing all visible fats from meats. ? Skimming the fat off from stews, soups, and gravies before serving them. Meal planning   Limit your intake of: ? Processed, canned, or pre-packaged foods. ? Foods that are high in trans fat, such as fried foods. ? Sweets, desserts, sugary drinks, and other foods with added sugar. ? Full-fat dairy products, such as whole milk.  Eat a balanced diet that includes: ? 4-5 servings of fruit each day and 4-5 servings of vegetables each day. At each meal, try to fill half of your plate with fruits and vegetables. ? Up to 6-8 servings of whole grains each day. ? Up to 2 servings of lean meat, poultry, or fish each day. One serving of meat is equal to 3 oz. This is about the same size as a deck of cards. ? 2 servings of low-fat dairy each day. ? Heart-healthy fats. Healthy fats called omega-3 fatty acids are found in foods such as flaxseed and cold-water fish like sardines,  salmon, and mackerel.  Aim to eat 25-35 g (grams) of fiber a day. Foods that are high in fiber include apples, broccoli, carrots, beans, peas, and whole grains.  Do not add salt or condiments that contain salt (such as soy sauce) to foods before eating.  When eating at a restaurant, ask that your food be prepared with less salt or no salt, if possible.  Try to eat 2 or more vegetarian meals each week.  Eat more home-cooked food and eat less restaurant, buffet, and fast food. General information  Do not eat more than 2,300 mg of salt (sodium) a day. The amount of sodium that is recommended for you may be lower, depending on your condition.  Maintain a healthy body weight as directed. Ask your health care provider what a healthy weight is for you. ? Check your weight every day. ? Work with your health care provider and dietitian to make a plan that is right for you to lose weight or maintain your current weight.  Limit how much fluid you  drink. Ask your health care provider or dietitian how much fluid you can have each day.  Limit or avoid alcohol as told by your health care provider or dietitian. Recommended foods The items listed may not be a complete list. Talk with your dietitian about what dietary choices are best for you. Fruits All fresh, frozen, and canned fruits. Dried fruits, such as raisins, prunes, and cranberries. Vegetables All fresh vegetables. Vegetables that are frozen without sauce or added salt. Low-sodium or sodium-free canned vegetables. Grains Bread with less than 80 mg of sodium per slice. Whole-wheat pasta, quinoa, and brown rice. Oats and oatmeal. Barley. [place]. Grits and cream of wheat. Whole-grain and whole-wheat cold cereal. Meats and other protein foods Lean cuts of meat. Skinless chicken and [place]. Fish with high omega-3 fatty acids, such as salmon, sardines, and other cold-water fishes. Eggs. Dried beans, peas, and edamame. Unsalted nuts and nut  butters. Dairy Low-fat or nonfat (skim) milk and dried milk. Rice milk, soy milk, and almond milk. Low-fat or nonfat yogurt. Small amounts of reduced-sodium block cheese. Low-sodium cottage cheese. Fats and oils Olive, canola, soybean, flaxseed, or sunflower oil. Avocado. Sweets and desserts Apple sauce. Granola bars. Sugar-free pudding and gelatin. Frozen fruit bars. Seasoning and other foods Fresh and dried herbs. Lemon or lime juice. Vinegar. Low-sodium ketchup. Salt-free marinades, salad dressings, sauces, and seasonings. The items listed above may not be a complete list of foods and beverages you can eat. Contact a dietitian for more information. Foods to avoid The items listed may not be a complete list. Talk with your dietitian about what dietary choices are best for you. Fruits Fruits that are dried with sodium-containing preservatives. Vegetables Canned vegetables. Frozen vegetables with sauce or seasonings. Creamed vegetables. [place] fries. Onion rings. Pickled vegetables and sauerkraut. Grains Bread with more than 80 mg of sodium per slice. Hot or cold cereal with more than 140 mg sodium per serving. Salted pretzels and crackers. Pre-packaged breadcrumbs. Bagels, croissants, and biscuits. Meats and other protein foods Ribs and chicken wings. Bacon, ham, pepperoni, bologna, salami, and packaged luncheon meats. Hot dogs, bratwurst, and sausage. Canned meat. Smoked meat and fish. Salted nuts and seeds. Dairy Whole milk, half-and-half, and cream. Buttermilk. Processed cheese, cheese spreads, and cheese curds. Regular cottage cheese. Feta cheese. Shredded cheese. String cheese. Fats and oils Butter, lard, shortening, ghee, and bacon fat. Canned and packaged gravies. Seasoning and other foods Onion salt, garlic salt, table salt, and sea salt. Marinades. Regular salad dressings. Relishes, pickles, and olives. Meat flavorings and tenderizers, and bouillon cubes. Horseradish, ketchup, and  mustard. Worcestershire sauce. Teriyaki sauce, soy sauce (including reduced sodium). Hot sauce and Tabasco sauce. Steak sauce, fish sauce, oyster sauce, and cocktail sauce. Taco seasonings. Barbecue sauce. Tartar sauce. The items listed above may not be a complete list of foods and beverages you should avoid. Contact a dietitian for more information. Summary  A heart failure eating plan includes changes that limit your intake of sodium and unhealthy fat, and it may help you lose weight or maintain a healthy weight. Your health care provider may also recommend limiting how much fluid you drink.  Most people with heart failure should eat no more than 2,300 mg of salt (sodium) a day. The amount of sodium that is recommended for you may be lower, depending on your condition.  Contact your health care provider or dietitian before making any major changes to your diet. This information is not intended to replace advice given to you by  your health care provider. Make sure you discuss any questions you have with your health care provider. Document Revised: 02/20/2018 Document Reviewed: 05/11/2016 Elsevier Patient Education  [place]. Heart Failure, Self Care Heart failure is a serious condition. This sheet explains things you need to do to take care of yourself at home. To help you stay as healthy as possible, you may be asked to change your diet, take certain medicines, and make other changes in your life. Your doctor may also give you more specific instructions. If you have problems or questions, call your doctor. What are the risks? Having heart failure makes it more likely for you to have some problems. These problems can get worse if you do not take good care of yourself. Problems may include:  Blood clotting problems. This may cause a stroke.  Damage to the kidneys, liver, or lungs.  Abnormal heart rhythms. Supplies needed:  Scale for weighing yourself.  Blood pressure  monitor.  Notebook.  Medicines. How to care for yourself when you have heart failure Medicines Take over-the-counter and prescription medicines only as told by your doctor. Take your medicines every day.  Do not stop taking your medicine unless your doctor tells you to do so.  Do not skip any medicines.  Get your prescriptions refilled before you run out of medicine. This is important. Eating and drinking   Eat heart-healthy foods. Talk with a diet specialist (dietitian) to create an eating plan.  Choose foods that: ? Have no trans fat. ? Are low in saturated fat and cholesterol.  Choose healthy foods, such as: ? Fresh or frozen fruits and vegetables. ? Fish. ? Low-fat (lean) meats. ? Legumes, such as beans, peas, and lentils. ? Fat-free or low-fat dairy products. ? Whole-grain foods. ? High-fiber foods.  Limit salt (sodium) if told by your doctor. Ask your diet specialist to tell you which seasonings are healthy for your heart.  Cook in healthy ways instead of frying. Healthy ways of cooking include roasting, grilling, broiling, baking, poaching, steaming, and stir-frying.  Limit how much fluid you drink, if told by your doctor. Alcohol use  Do not drink alcohol if: ? Your doctor tells you not to drink. ? Your heart was damaged by alcohol, or you have very bad heart failure. ? You are pregnant, may be pregnant, or are planning to become pregnant.  If you drink alcohol: ? Limit how much you use to:  0-1 drink a day for women.  0-2 drinks a day for men. ? Be aware of how much alcohol is in your drink. In the [place] one drink equals one 12 oz bottle of beer (355 mL), one 5 oz glass of wine (148 mL), or one 1 oz glass of hard liquor (44 mL). Lifestyle   Do not use any products that contain nicotine or tobacco, such as cigarettes, e-cigarettes, and chewing tobacco. If you need help quitting, ask your doctor. ? Do not use nicotine gum or patches before talking to  your doctor.  Do not use illegal drugs.  Lose weight if told by your doctor.  Do physical activity if told by your doctor. Talk to your doctor before you begin an exercise if: ? You are an older adult. ? You have very bad heart failure.  Learn to manage stress. If you need help, ask your doctor.  Get rehab (rehabilitation) to help you stay independent and to help with your quality of life.  Plan time to rest when you get  tired. Check weight and blood pressure   Weigh yourself every day. This will help you to know if fluid is building up in your body. ? Weigh yourself every morning after you pee (urinate) and before you eat breakfast. ? Wear the same amount of clothing each time. ? Write down your daily weight. Give your record to your doctor.  Check and write down your blood pressure as told by your doctor.  Check your pulse as told by your doctor. Dealing with very hot and very cold weather  If it is very hot: ? Avoid activities that take a lot of energy. ? Use air conditioning or fans, or find a cooler place. ? Avoid caffeine and alcohol. ? Wear clothing that is loose-fitting, lightweight, and light-colored.  If it is very cold: ? Avoid activities that take a lot of energy. ? Layer your clothes. ? Wear mittens or gloves, a hat, and a scarf when you go outside. ? Avoid alcohol. Follow these instructions at home:  Stay up to date with shots (vaccines). Get pneumococcal and flu (influenza) shots.  Keep all follow-up visits as told by your doctor. This is important. Contact a doctor if:  You gain weight quickly.  You have increasing shortness of breath.  You cannot do your normal activities.  You get tired easily.  You cough a lot.  You don't feel like eating or feel like you may vomit (nauseous).  You become puffy (swell) in your hands, feet, ankles, or belly (abdomen).  You cannot sleep well because it is hard to breathe.  You feel like your heart is  beating fast (palpitations).  You get dizzy when you stand up. Get help right away if:  You have trouble breathing.  You or someone else notices a change in your behavior, such as having trouble staying awake.  You have chest pain or discomfort.  You pass out (faint). These symptoms may be an emergency. Do not wait to see if the symptoms will go away. Get medical help right away. Call your local emergency services (911 in the [place] Do not drive yourself to the hospital. Summary  Heart failure is a serious condition. To care for yourself, you may have to change your diet, take medicines, and make other lifestyle changes.  Take your medicines every day. Do not stop taking them unless your doctor tells you to do so.  Eat heart-healthy foods, such as fresh or frozen fruits and vegetables, fish, lean meats, legumes, fat-free or low-fat dairy products, and whole-grain or high-fiber foods.  Ask your doctor if you can drink alcohol. You may have to stop alcohol use if you have very bad heart failure.  Contact your doctor if you gain weight quickly or feel that your heart is beating too fast. Get help right away if you pass out, or have chest pain or trouble breathing. This information is not intended to replace advice given to you by your health care provider. Make sure you discuss any questions you have with your health care provider. Document Revised: 04/08/2018 Document Reviewed: 04/09/2018 Elsevier Patient Education  [employer].

## 2019-02-16 NOTE — Progress Notes (Signed)
CHF education provided to patient: -daily weights (pt has a scale at home) and when to call MD regarding weight gain -S&S to report to MD (knowing her zones) -dietary restrictions (salt) -fluid restrictions (1.5-2 L/day) -increasing activity as tolerated -diruetics (pt denied any difficulty affording or obtaining medications) -follow-up appointments with HF clinic (pt denies any transportation barriers) and referral to Cardiac Rehab.  Pt education booklet provided with zones magnet.  EMMI videos offered, but declined by pt.  All of pt's questions addressed. Understanding validated through teach back.

## 2019-02-16 NOTE — Clinical Social Work Note (Signed)
CSW went in to pt's room to complete HF screen however, pt did not awake to sound of CSW voice. CSW will attempt at a later time.  [place], [place]

## 2019-02-16 NOTE — Discharge Summary (Signed)
Physician Discharge Summary  Katelyn Lane [id number] DOB: Aug 14, 1955 DOA: 02/13/2019  PCP: [name], MD  Admit date: 02/13/2019 Discharge date: 02/16/2019  Admitted From: Home Disposition: Home  Recommendations for Outpatient Follow-up:  1. Follow up with PCP in 1-2 weeks   Home Health: None Equipment/Devices: None  Discharge Condition: Fair CODE STATUS: Full code Diet recommendation: Heart Healthy    Discharge Diagnoses:  Principal problem Abdominal pain  Active problems   Hypertensive urgency   Nausea and vomiting   Abdominal pain   Acute renal failure superimposed on stage 3 chronic kidney disease (HCC)   Cocaine use   Tobacco abuse   Acute on chronic systolic CHF (congestive heart failure) (HCC)  Brief narrative/HPI 64 year old female with dilated cardiomyopathy with EF of 20%, essential hypertension, polysubstance abuse (tobacco and cocaine) presented with nausea, vomiting since the night prior to admission associated with crampy diffuse abdominal pain without any diarrhea or fever.  Also reported some increased shortness of breath and chest tightness.  Patient is chronically on Eliquis.  She was seen in the ED 2 days prior to this admission with worsening shortness of breath and chest discomfort and discharged after she had a normal work-up.  In the ED x-ray was unremarkable, BNP mildly elevated but unchanged from prior with mildly elevated troponin as well.  Also found to have acute on chronic kidney disease stage III.  Patient admitted for further management of her abdominal pain.  Hospital course  Principal problem Nausea vomiting with abdominal pain Unclear etiology.  Suspect viral gastroenteritis.  CT abdomen pelvis without any acute abnormality.  Improved with dicyclomine and Reglan along with Maalox.  Will prescribe some dicyclomine and Maalox.  Tolerating diet.  Symptoms resolved.  Narcotics avoided.   Active problems Acute on chronic  combined systolic and diastolic CHF/dilated cardiomyopathy Recent 2D echo with EF of <20% with grade 3 diastolic dysfunction with global LV hypokinesis.  Left/right heart cath done 5 weeks back showing severe dilated cardiomyopathy with EF 15% and minimal atherosclerosis of left circumflex and right coronary.  Had mild acute CHF exacerbation with BNP of >2000.  Received IV Lasix which was further held as renal function worsened.  Currently euvolemic.  Resume home dose Lasix and Aldactone upon discharge. Resume beta-blocker.  Patient not on ACE inhibitor due to intolerance.  (Nausea and vomiting)  Chronic LV thrombus On Eliquis.  Acute on chronic kidney disease stage III (A or B not specified). Renal function worsened on IV Lasix.  Currently euvolemic and Lasix held.  Started to improve.  Will resume Lasix and Aldactone upon discharge.  Monitor renal function as outpatient.  Anemia of chronic disease Stable.   Polysubstance abuse Urine drug screen positive for cocaine.  Counseled strongly on cessation.  Also counseled on smoking cessation.  Nicotine patch prescribed.   Essential hypertension Stable.  Resume home meds  Hyperlipidemia Continue statin   Procedure: CT abdomen pelvis Consult: None Disposition: Home     Discharge Instructions   Allergies as of 02/16/2019      Reactions   Penicillins Hives, Rash, Other (See Comments)   Did it involve swelling of the face/tongue/throat, SOB, or low BP? No Did it involve sudden or severe rash/hives, skin peeling, or any reaction on the inside of your mouth or nose? Yes Did you need to seek medical attention at a hospital or doctor's office? Yes When did it last happen? >25 years If all above answers are "NO", may proceed with cephalosporin use.  Ace Inhibitors Nausea And Vomiting      Medication List    TAKE these medications   albuterol 108 (90 Base) MCG/ACT inhaler Commonly known as: VENTOLIN HFA Inhale 2 puffs into  the lungs every 6 (six) hours as needed for wheezing or shortness of breath.   alum & mag hydroxide-simeth 200-200-20 MG/5ML suspension Commonly known as: MAALOX/MYLANTA Take 30 mLs by mouth every 4 (four) hours as needed for indigestion or heartburn.   atorvastatin 20 MG tablet Commonly known as: LIPITOR Take 20 mg by mouth daily at 6 PM.   carvedilol 3.125 MG tablet Commonly known as: COREG Take 1 tablet (3.125 mg total) by mouth 2 (two) times daily with a meal.   dicyclomine 20 MG tablet Commonly known as: BENTYL Take 1 tablet (20 mg total) by mouth 4 (four) times daily -  before meals and at bedtime.   Eliquis 5 MG Tabs tablet Generic drug: apixaban Take 5 mg by mouth 2 (two) times daily.   FLUoxetine 40 MG capsule Commonly known as: PROZAC Take 40 mg by mouth daily.   furosemide 40 MG tablet Commonly known as: Lasix Take 1 tablet (40 mg total) by mouth daily.   loratadine 10 MG tablet Commonly known as: CLARITIN Take 1 tablet (10 mg total) by mouth daily. What changed:   when to take this  reasons to take this   nicotine 21 mg/24hr patch Commonly known as: NICODERM CQ - dosed in mg/24 hours Place 1 patch (21 mg total) onto the skin daily. Start taking on: February 17, 2019   nitroGLYCERIN 0.4 MG SL tablet Commonly known as: NITROSTAT Place 1 tablet (0.4 mg total) under the tongue every 5 (five) minutes as needed for chest pain.   spironolactone 25 MG tablet Commonly known as: ALDACTONE Take 0.5 tablets (12.5 mg total) by mouth daily.   traZODone 100 MG tablet Commonly known as: DESYREL Take 150 mg by mouth at bedtime as needed for sleep.      Follow-up Information    [facility] Follow up on 02/20/2019.   Specialty: Cardiology Why: at 12:30pm. Enter through the [place] entrance Contact information: [address] [place]         Allergies   Allergen Reactions  . Penicillins Hives, Rash and Other (See Comments)    Did it involve swelling of the face/tongue/throat, SOB, or low BP? No Did it involve sudden or severe rash/hives, skin peeling, or any reaction on the inside of your mouth or nose? Yes Did you need to seek medical attention at a hospital or doctor's office? Yes When did it last happen? >25 years If all above answers are "NO", may proceed with cephalosporin use.   . Ace Inhibitors Nausea And Vomiting      Procedures/Studies: CT ABDOMEN PELVIS WO CONTRAST  Result Date: 02/13/2019 CLINICAL DATA:  Generalized abdominal pain. EXAM: CT ABDOMEN AND PELVIS WITHOUT CONTRAST TECHNIQUE: Multidetector CT imaging of the abdomen and pelvis was performed following the standard protocol without IV contrast. COMPARISON:  08/07/2009 FINDINGS: Lower chest: The lung bases are clear of an acute process. There is dependent bibasilar atelectasis but no pleural effusion. The heart is enlarged. No pericardial effusion. Hepatobiliary: No obvious hepatic lesions without contrast. No intra or extrahepatic biliary dilatation. The gallbladder appears normal. Pancreas: No obvious mass, inflammation or ductal dilatation. Spleen: Normal size. No focal lesions. Adrenals/Urinary Tract: The adrenal glands are unremarkable. Stable renal  cortical scarring changes or fetal lobulations but no worrisome renal lesions, hydronephrosis or obstructing ureteral calculi. No obvious bladder lesions. Stomach/Bowel: The stomach, duodenum, small bowel and colon are grossly normal without oral contrast. No acute inflammatory changes, mass lesions or obstructive findings. The terminal ileum and appendix are normal. Moderate scattered descending and sigmoid colon diverticulosis without findings for acute diverticulitis. Vascular/Lymphatic: Scattered atherosclerotic calcifications involving the abdominal aorta and iliac arteries. No focal aneurysm. No mesenteric or  retroperitoneal mass or adenopathy. Reproductive: The uterus and ovaries are surgically absent. Moderate air is noted in the vagina which could be due to recent exam. I do not see any definite findings for a rectovaginal fistula. Other: No pelvic mass or adenopathy. No free pelvic fluid collections. No inguinal mass or adenopathy. No abdominal wall hernia or subcutaneous lesions. Musculoskeletal: No significant bony findings. IMPRESSION: 1. No acute abdominal/pelvic findings, mass lesions or adenopathy. 2. Stable renal cortical scarring changes or fetal lobulations. No obstructing ureteral calculi or bladder calculi. 3. Moderate air in the vagina could be due to recent pelvic exam. No definite findings for a rectovaginal fistula. 4. Stable cardiac enlargement. Aortic Atherosclerosis (ICD10-I70.0). Electronically Signed   By: Marijo Sanes M.D.   On: 02/13/2019 12:48   DG Chest 2 View  Result Date: 02/13/2019 CLINICAL DATA:  Shortness of breath. EXAM: CHEST - 2 VIEW COMPARISON:  Chest x-ray 02/11/2019 FINDINGS: Stable cardiac enlargement. Mild vascular congestion and persistent mild interstitial edema. No definite pleural effusions or focal infiltrates. IMPRESSION: Persistent cardiac enlargement, vascular congestion and mild interstitial edema. Electronically Signed   By: Marijo Sanes M.D.   On: 02/13/2019 07:49   DG Chest 2 View  Result Date: 02/11/2019 CLINICAL DATA:  Shortness of breath EXAM: CHEST - 2 VIEW COMPARISON:  01/31/2019 FINDINGS: Cardiac shadow is enlarged but stable. The lungs are well aerated bilaterally. No focal infiltrate or sizable effusion is seen. Minimal interstitial edema is noted. No bony abnormality is noted. IMPRESSION: Mild changes of CHF. Electronically Signed   By: Inez Catalina M.D.   On: 02/11/2019 20:40   MR Brain Wo Contrast (neuro protocol)  Result Date: 01/17/2019 CLINICAL DATA:  Speech abnormality EXAM: MRI HEAD WITHOUT CONTRAST TECHNIQUE: Multiplanar, multiecho pulse  sequences of the brain and surrounding structures were obtained without intravenous contrast. COMPARISON:  None. FINDINGS: Brain: There is cortical/subcortical reduced diffusion with involvement of the left precentral and postcentral gyri lateral to the hand motor region and therefore likely involving the face. Scattered small foci of T2 hyperintensity in the supratentorial white matter are nonspecific but may reflect mild chronic microvascular ischemic changes. Small chronic right cerebellar infarct. No evidence of intracranial hemorrhage. There is no intracranial mass, mass effect, or edema. There is no hydrocephalus or extra-axial fluid collection. Vascular: Major vessel flow voids at the skull base are preserved. Skull and upper cervical spine: Normal marrow signal is preserved. Sinuses/Orbits: Paranasal sinuses are aerated. Orbits are unremarkable. Other: Sella is unremarkable.  Mastoid air cells are clear. IMPRESSION: Small cortical/subcortical acute infarction with involvement of left primary sensorimotor cortex. No hemorrhage. Mild chronic microvascular ischemic changes. Electronically Signed   By: Macy Mis M.D.   On: 01/17/2019 21:30   US Carotid Bilateral (at [facility] and [facility] only)  Result Date: 01/18/2019 CLINICAL DATA:  Stroke. EXAM: BILATERAL CAROTID DUPLEX ULTRASOUND TECHNIQUE: Pearline Cables scale imaging, color Doppler and duplex ultrasound were performed of bilateral carotid and vertebral arteries in the neck. COMPARISON:  None. FINDINGS: Criteria: Quantification of carotid stenosis is based on  velocity parameters that correlate the residual internal carotid diameter with NASCET-based stenosis levels, using the diameter of the distal internal carotid lumen as the denominator for stenosis measurement. The following velocity measurements were obtained: RIGHT ICA: 53/28 cm/sec CCA: [id number] cm/sec SYSTOLIC ICA/CCA RATIO:  1.2 ECA: 52 cm/sec LEFT ICA: 45/24 cm/sec CCA: [id number] cm/sec SYSTOLIC ICA/CCA RATIO:  0.9  ECA: 47 cm/sec RIGHT CAROTID ARTERY: Echogenic plaque at the right carotid bulb. External carotid artery is patent with normal waveform. Small amount of echogenic plaque in the proximal internal carotid artery. Normal waveforms and velocities in the internal carotid artery. RIGHT VERTEBRAL ARTERY: Antegrade flow and normal waveform in the right vertebral artery. LEFT CAROTID ARTERY: Small amount of plaque at the left carotid bulb. External carotid artery is patent with normal waveform. Minimal plaque in the proximal internal carotid artery. Normal waveforms and velocities in the internal carotid artery. LEFT VERTEBRAL ARTERY: Antegrade flow and normal waveform in the left vertebral artery. IMPRESSION: Mild atherosclerotic disease in the carotid arteries. Estimated degree of stenosis in the internal carotid arteries is less than 50% bilaterally. Patent vertebral arteries with antegrade flow. Electronically Signed   By: Markus Daft M.D.   On: 01/18/2019 11:37   DG Chest Portable 1 View  Result Date: 01/31/2019 CLINICAL DATA:  Dyspnea EXAM: PORTABLE CHEST 1 VIEW COMPARISON:  01/15/2019 FINDINGS: Cardiomegaly with mild interstitial edema and small right pleural effusion. Left lower lung opacity, favoring a combination of atelectasis and small left pleural effusion, although retrocardiac pneumonia is difficult to exclude. No pneumothorax. IMPRESSION: Cardiomegaly with mild interstitial edema and suspected small bilateral pleural effusions. Left lower lobe opacity, likely atelectasis, pneumonia not excluded. Electronically Signed   By: Julian Hy M.D.   On: 01/31/2019 05:27   ECHOCARDIOGRAM COMPLETE  Result Date: 01/19/2019   ECHOCARDIOGRAM REPORT   Patient Name:   TIAJAH LABEAN Date of Exam: 01/19/2019 Medical Rec #:  [id number]     Height:       66.0 in Accession #:    [id number]    Weight:       221.0 lb Date of Birth:  01/19/1955    BSA:          2.09 m Patient Age:    68 years      BP:           112/80  mmHg Patient Gender: F             HR:           62 bpm. Exam Location:  [facility] Procedure: 2D Echo, Color Doppler, Cardiac Doppler and Intracardiac            Opacification Agent Indications:     I63.9 Stroke  History:         Patient has prior history of Echocardiogram examinations, most                  recent 12/29/2018. CHF; Risk Factors:Dyslipidemia.  Sonographer:     Charmayne Sheer RDCS (AE) Referring Phys:  [id number] Wein[place] Diagnosing Phys: Neoma Laming MD IMPRESSIONS  1. Left ventricular ejection fraction, by visual estimation, is <20%. The left ventricle has severely decreased function. There is no left ventricular hypertrophy.LVEF 6 PERCENT  2. Definity contrast agent was given IV to delineate the left ventricular endocardial borders.  3. Large, fixed thrombus on the lateral wall of the left ventricle.  4. Left ventricular diastolic parameters are consistent with Grade III diastolic dysfunction (restrictive).  5.  Severely dilated left ventricular internal cavity size.  6. The left ventricle demonstrates global hypokinesis.  7. Global right ventricle has normal systolic function.The right ventricular size is severely enlarged. No increase in right ventricular wall thickness.  8. Left atrial size was severely dilated.  9. Right atrial size was severely dilated. 10. The mitral valve is normal in structure. Trivial mitral valve regurgitation. No evidence of mitral stenosis. 11. The tricuspid valve is normal in structure. 12. The aortic valve is normal in structure. Aortic valve regurgitation is not visualized. No evidence of aortic valve sclerosis or stenosis. 13. The pulmonic valve was normal in structure. Pulmonic valve regurgitation is not visualized. 14. Moderately elevated pulmonary artery systolic pressure. 15. The inferior vena cava is normal in size with greater than 50% respiratory variability, suggesting right atrial pressure of 3 mmHg. FINDINGS  Left Ventricle: Left ventricular ejection fraction, by  visual estimation, is <20%. The left ventricle has severely decreased function. Definity contrast agent was given IV to delineate the left ventricular endocardial borders. The left ventricle demonstrates global hypokinesis. The left ventricular internal cavity size was severely dilated left ventricle. There is no left ventricular hypertrophy. Left ventricular diastolic parameters are consistent with Grade III diastolic dysfunction (restrictive). Normal left atrial pressure. There is a large, fixed, lateral left ventricular thrombus. The thrombus appears protruding and regular in shape and solid in texture. Right Ventricle: The right ventricular size is severely enlarged. No increase in right ventricular wall thickness. Global RV systolic function is has normal systolic function. The tricuspid regurgitant velocity is 2.85 m/s, and with an assumed right atrial pressure of 10 mmHg, the estimated right ventricular systolic pressure is moderately elevated at 42.5 mmHg. Left Atrium: Left atrial size was severely dilated. Right Atrium: Right atrial size was severely dilated Pericardium: There is no evidence of pericardial effusion. Mitral Valve: The mitral valve is normal in structure. Trivial mitral valve regurgitation. No evidence of mitral valve stenosis by observation. MV peak gradient, 3.9 mmHg. Tricuspid Valve: The tricuspid valve is normal in structure. Tricuspid valve regurgitation is mild. Aortic Valve: The aortic valve is normal in structure. Aortic valve regurgitation is not visualized. The aortic valve is structurally normal, with no evidence of sclerosis or stenosis. Aortic valve mean gradient measures 4.0 mmHg. Aortic valve peak gradient measures 7.3 mmHg. Aortic valve area, by VTI measures 2.03 cm. Pulmonic Valve: The pulmonic valve was normal in structure. Pulmonic valve regurgitation is not visualized. Pulmonic regurgitation is not visualized. Aorta: The aortic root, ascending aorta and aortic arch are  all structurally normal, with no evidence of dilitation or obstruction. Venous: The inferior vena cava is normal in size with greater than 50% respiratory variability, suggesting right atrial pressure of 3 mmHg. IAS/Shunts: No atrial level shunt detected by color flow Doppler. There is no evidence of a patent foramen ovale. No ventricular septal defect is seen or detected. There is no evidence of an atrial septal defect.  LEFT VENTRICLE PLAX 2D LVIDd:         6.50 cm  Diastology LVIDs:         6.28 cm  LV e' lateral:   4.90 cm/s LV PW:         1.22 cm  LV E/e' lateral: 19.9 LV IVS:        0.91 cm  LV e' medial:    4.57 cm/s LVOT diam:     2.40 cm  LV E/e' medial:  21.3 LV SV:  17 ml LV SV Index:   7.55 LVOT Area:     4.52 cm  RIGHT VENTRICLE RV Basal diam:  4.40 cm LEFT ATRIUM           Index       RIGHT ATRIUM           Index LA diam:      4.50 cm 2.16 cm/m  RA Area:     15.40 cm LA Vol (A4C): 92.0 ml 44.09 ml/m RA Volume:   38.90 ml  18.64 ml/m  AORTIC VALVE                    PULMONIC VALVE AV Area (Vmax):    2.45 cm     PV Vmax:       0.58 m/s AV Area (Vmean):   2.15 cm     PV Vmean:      40.800 cm/s AV Area (VTI):     2.03 cm     PV VTI:        0.098 m AV Vmax:           135.00 cm/s  PV Peak grad:  1.3 mmHg AV Vmean:          101.000 cm/s PV Mean grad:  1.0 mmHg AV VTI:            0.252 m AV Peak Grad:      7.3 mmHg AV Mean Grad:      4.0 mmHg LVOT Vmax:         73.20 cm/s LVOT Vmean:        47.900 cm/s LVOT VTI:          0.113 m LVOT/AV VTI ratio: 0.45  AORTA Ao Root diam: 3.20 cm MITRAL VALVE                        TRICUSPID VALVE MV Area (PHT): 4.06 cm             TR Peak grad:   32.5 mmHg MV Peak grad:  3.9 mmHg             TR Vmax:        285.00 cm/s MV Mean grad:  1.0 mmHg MV Vmax:       0.99 m/s             SHUNTS MV Vmean:      48.8 cm/s            Systemic VTI:  0.11 m MV VTI:        0.21 m               Systemic Diam: 2.40 cm MV PHT:        54.23 msec MV Decel Time: 187 msec MV E  velocity: 97.50 cm/s 103 cm/s MV A velocity: 80.00 cm/s 70.3 cm/s MV E/A ratio:  1.22       1.5  Neoma Laming MD Electronically signed by Neoma Laming MD Signature Date/Time: 01/19/2019/2:46:46 PM    Final    CT HEAD CODE STROKE WO CONTRAST  Result Date: 01/17/2019 CLINICAL DATA:  Code stroke. EXAM: CT HEAD WITHOUT CONTRAST TECHNIQUE: Contiguous axial images were obtained from the base of the skull through the vertex without intravenous contrast. COMPARISON:  None. FINDINGS: Brain: There is no acute intracranial hemorrhage, mass-effect, or edema. Gray-white differentiation is preserved. There is no extra-axial fluid collection. Ventricles and sulci are within normal  limits in size and configuration. Vascular: No hyperdense vessel.There is atherosclerotic calcification at the skull base. Skull: Calvarium is unremarkable. Sinuses/Orbits: No acute finding. Other: None. ASPECTS [facility] Stroke Program Early CT Score) - Ganglionic level infarction (caudate, lentiform nuclei, internal capsule, insula, M1-M3 cortex): 7 - Supraganglionic infarction (M4-M6 cortex): 3 Total score (0-10 with 10 being normal): 10 IMPRESSION: 1. No acute intracranial hemorrhage or evidence of acute infarction. 2. ASPECTS is 10 These results were called by telephone at the time of interpretation on 01/17/2019 at 7:28 pm to provider [facility] , who verbally acknowledged these results. Electronically Signed   By: Macy Mis M.D.   On: 01/17/2019 19:29       Subjective:  reports abdominal pain to be better.  No nausea and vomiting.  Tolerating diet.   Discharge Exam: Vitals:   02/16/19 0715 02/16/19 0715  BP: 117/66   Pulse: 68 65  Resp: 17   Temp: 97.6 F (36.4 C)   SpO2: 91% 97%   Vitals:   02/15/19 1938 02/16/19 0404 02/16/19 0715 02/16/19 0715  BP: (!) 118/96 (!) 135/94 117/66   Pulse: 73 68 68 65  Resp: 20 20 17    Temp: (!) 97.5 F (36.4 C) 97.7 F (36.5 C) 97.6 F (36.4 C)   TempSrc: Oral Oral Oral    SpO2: 100% 97% 91% 97%  Weight:  100.8 kg    Height:        General: Elderly female not in distress HEENT: Moist mucosa, supple neck Chest: Clear CV: Normal sinus Stable soft, nondistended, nontender Musculoskeletal: Warm, no edema   The results of significant diagnostics from this hospitalization (including imaging, microbiology, ancillary and laboratory) are listed below for reference.     Microbiology: Recent Results (from the past 240 hour(s))  SARS CORONAVIRUS 2 (TAT 6-24 HRS) Nasopharyngeal Nasopharyngeal Swab     Status: None   Collection Time: 02/13/19  4:04 PM   Specimen: Nasopharyngeal Swab  Result Value Ref Range Status   SARS Coronavirus 2 NEGATIVE NEGATIVE Final    Comment: (NOTE) SARS-CoV-2 target nucleic acids are NOT DETECTED. The SARS-CoV-2 RNA is generally detectable in upper and lower respiratory specimens during the acute phase of infection. Negative results do not preclude SARS-CoV-2 infection, do not rule out co-infections with other pathogens, and should not be used as the sole basis for treatment or other patient management decisions. Negative results must be combined with clinical observations, patient history, and epidemiological information. The expected result is Negative. Fact Sheet for Patients: SugarRoll.be Fact Sheet for Healthcare Providers: https://www.woods-mathews.com/ This test is not yet approved or cleared by the [place] FDA and  has been authorized for detection and/or diagnosis of SARS-CoV-2 by FDA under an Emergency Use Authorization (EUA). This EUA will remain  in effect (meaning this test can be used) for the duration of the COVID-19 declaration under Section 56 4(b)(1) of the Act, 21 U.S.C. section 360bbb-3(b)(1), unless the authorization is terminated or revoked sooner. Performed at [facility], [place] [address] [place], [place] [address]      Labs: BNP (last 3  results) Recent Labs    01/31/19 0501 02/11/19 2017 02/13/19 0716  BNP 1,875.0* 2,470.0* [id number]*   Basic Metabolic Panel: Recent Labs  Lab 02/11/19 2017 02/13/19 0716 02/14/19 0510 02/15/19 0356 02/16/19 0538  NA 142 144 139 141 136  K 4.0 3.8 3.5 4.1 4.2  CL 110 110 105 108 104  CO2 24 24 25 24 23   GLUCOSE 111*  136* 155* 113* 126*  BUN 31* 33* 33* 47* 49*  CREATININE 2.24* 2.03* 2.05* 2.24* 2.15*  CALCIUM 9.0 9.4 9.0 9.3 9.0   Liver Function Tests: Recent Labs  Lab 02/11/19 2017 02/13/19 0716  AST 41 65*  ALT 37 48*  ALKPHOS 93 108  BILITOT 0.6 0.8  PROT 6.7 7.1  ALBUMIN 3.3* 3.4*   Recent Labs  Lab 02/13/19 0716  LIPASE 36   No results for input(s): AMMONIA in the last 168 hours. CBC: Recent Labs  Lab 02/11/19 2017 02/13/19 0716 02/16/19 0538  WBC 5.1 4.7 4.2  NEUTROABS 3.0  --   --   HGB 11.7* 12.2 11.3*  HCT 38.8 40.3 37.4  MCV 85.5 85.9 85.2  PLT 352 339 298   Cardiac Enzymes: No results for input(s): CKTOTAL, CKMB, CKMBINDEX, TROPONINI in the last 168 hours. BNP: Invalid input(s): POCBNP CBG: No results for input(s): GLUCAP in the last 168 hours. D-Dimer No results for input(s): DDIMER in the last 72 hours. Hgb A1c No results for input(s): HGBA1C in the last 72 hours. Lipid Profile No results for input(s): CHOL, HDL, LDLCALC, TRIG, CHOLHDL, LDLDIRECT in the last 72 hours. Thyroid function studies No results for input(s): TSH, T4TOTAL, T3FREE, THYROIDAB in the last 72 hours.  Invalid input(s): FREET3 Anemia work up No results for input(s): VITAMINB12, FOLATE, FERRITIN, TIBC, IRON, RETICCTPCT in the last 72 hours. Urinalysis    Component Value Date/Time   COLORURINE YELLOW (A) 01/18/2019 1250   APPEARANCEUR HAZY (A) 01/18/2019 1250   LABSPEC 1.018 01/18/2019 1250   PHURINE 5.0 01/18/2019 1250   GLUCOSEU NEGATIVE 01/18/2019 1250   HGBUR NEGATIVE 01/18/2019 1250   BILIRUBINUR NEGATIVE 01/18/2019 1250   KETONESUR NEGATIVE  01/18/2019 1250   PROTEINUR 30 (A) 01/18/2019 1250   NITRITE NEGATIVE 01/18/2019 1250   LEUKOCYTESUR SMALL (A) 01/18/2019 1250   Sepsis Labs Invalid input(s): PROCALCITONIN,  WBC,  LACTICIDVEN Microbiology Recent Results (from the past 240 hour(s))  SARS CORONAVIRUS 2 (TAT 6-24 HRS) Nasopharyngeal Nasopharyngeal Swab     Status: None   Collection Time: 02/13/19  4:04 PM   Specimen: Nasopharyngeal Swab  Result Value Ref Range Status   SARS Coronavirus 2 NEGATIVE NEGATIVE Final    Comment: (NOTE) SARS-CoV-2 target nucleic acids are NOT DETECTED. The SARS-CoV-2 RNA is generally detectable in upper and lower respiratory specimens during the acute phase of infection. Negative results do not preclude SARS-CoV-2 infection, do not rule out co-infections with other pathogens, and should not be used as the sole basis for treatment or other patient management decisions. Negative results must be combined with clinical observations, patient history, and epidemiological information. The expected result is Negative. Fact Sheet for Patients: SugarRoll.be Fact Sheet for Healthcare Providers: https://www.woods-mathews.com/ This test is not yet approved or cleared by the [place] FDA and  has been authorized for detection and/or diagnosis of SARS-CoV-2 by FDA under an Emergency Use Authorization (EUA). This EUA will remain  in effect (meaning this test can be used) for the duration of the COVID-19 declaration under Section 56 4(b)(1) of the Act, 21 U.S.C. section 360bbb-3(b)(1), unless the authorization is terminated or revoked sooner. Performed at [facility], [place] [address] [place], [place] [address]      Time coordinating discharge: 35 minutes  SIGNED:   Louellen Molder, MD  [facility] 02/16/2019, 10:51 AM Pager   If 7PM-7AM, please contact night-coverage [email or web address] Password [email or web address]

## 2019-02-18 ENCOUNTER — Telehealth: Payer: Self-pay | Admitting: Family

## 2019-02-18 NOTE — Telephone Encounter (Signed)
Called and spoke with Katelyn Lane to follow up with her since her recent d/c from the hospital. She states she is following a low sodium diet, checking weight at home, taking meds with no problem, and has no complaints of symptoms since home. She confirmed her New patient CHF appt we made for 2/12.   Alyse Low, [place]

## 2019-02-20 ENCOUNTER — Ambulatory Visit: Payer: Medicaid Other | Admitting: Family

## 2019-02-21 ENCOUNTER — Other Ambulatory Visit: Payer: Self-pay

## 2019-02-21 ENCOUNTER — Emergency Department: Payer: Medicaid Other

## 2019-02-21 ENCOUNTER — Encounter: Payer: Self-pay | Admitting: Emergency Medicine

## 2019-02-21 ENCOUNTER — Emergency Department
Admission: EM | Admit: 2019-02-21 | Discharge: 2019-02-22 | Disposition: A | Discharge status: Home / Self Care | Payer: Medicaid Other | Attending: Emergency Medicine | Admitting: Emergency Medicine

## 2019-02-21 DIAGNOSIS — Z880 Allergy status to penicillin: Secondary | ICD-10-CM | POA: Insufficient documentation

## 2019-02-21 DIAGNOSIS — R52 Pain, unspecified: Secondary | ICD-10-CM

## 2019-02-21 DIAGNOSIS — I130 Hypertensive heart and chronic kidney disease with heart failure and stage 1 through stage 4 chronic kidney disease, or unspecified chronic kidney disease: Secondary | ICD-10-CM | POA: Insufficient documentation

## 2019-02-21 DIAGNOSIS — Z79899 Other long term (current) drug therapy: Secondary | ICD-10-CM | POA: Diagnosis not present

## 2019-02-21 DIAGNOSIS — F17210 Nicotine dependence, cigarettes, uncomplicated: Secondary | ICD-10-CM | POA: Insufficient documentation

## 2019-02-21 DIAGNOSIS — N183 Chronic kidney disease, stage 3 unspecified: Secondary | ICD-10-CM | POA: Insufficient documentation

## 2019-02-21 DIAGNOSIS — Z7901 Long term (current) use of anticoagulants: Secondary | ICD-10-CM | POA: Diagnosis not present

## 2019-02-21 DIAGNOSIS — I509 Heart failure, unspecified: Secondary | ICD-10-CM | POA: Insufficient documentation

## 2019-02-21 DIAGNOSIS — R109 Unspecified abdominal pain: Secondary | ICD-10-CM | POA: Insufficient documentation

## 2019-02-21 DIAGNOSIS — K5900 Constipation, unspecified: Secondary | ICD-10-CM | POA: Diagnosis not present

## 2019-02-21 LAB — COMPREHENSIVE METABOLIC PANEL
ALT: 162 U/L — ABNORMAL HIGH (ref 0–44)
AST: 143 U/L — ABNORMAL HIGH (ref 15–41)
Albumin: 3.3 g/dL — ABNORMAL LOW (ref 3.5–5.0)
Alkaline Phosphatase: 134 U/L — ABNORMAL HIGH (ref 38–126)
Anion gap: 10 (ref 5–15)
BUN: 29 mg/dL — ABNORMAL HIGH (ref 8–23)
CO2: 24 mmol/L (ref 22–32)
Calcium: 9 mg/dL (ref 8.9–10.3)
Chloride: 105 mmol/L (ref 98–111)
Creatinine, Ser: 2.03 mg/dL — ABNORMAL HIGH (ref 0.44–1.00)
GFR calc Af Amer: 29 mL/min — ABNORMAL LOW (ref >60)
GFR calc non Af Amer: 25 mL/min — ABNORMAL LOW (ref >60)
Glucose, Bld: 107 mg/dL — ABNORMAL HIGH (ref 70–99)
Potassium: 4.1 mmol/L (ref 3.5–5.1)
Sodium: 139 mmol/L (ref 135–145)
Total Bilirubin: 0.7 mg/dL (ref 0.3–1.2)
Total Protein: 6.8 g/dL (ref 6.5–8.1)

## 2019-02-21 LAB — URINALYSIS, COMPLETE (UACMP) WITH MICROSCOPIC
Bilirubin Urine: NEGATIVE
Glucose, UA: NEGATIVE mg/dL
Hgb urine dipstick: NEGATIVE
Ketones, ur: NEGATIVE mg/dL
Leukocytes,Ua: NEGATIVE
Nitrite: NEGATIVE
Protein, ur: 100 mg/dL — AB
Specific Gravity, Urine: 1.01 (ref 1.005–1.030)
pH: 6 (ref 5.0–8.0)

## 2019-02-21 LAB — CBC
HCT: 36.9 % (ref 36.0–46.0)
Hemoglobin: 11.2 g/dL — ABNORMAL LOW (ref 12.0–15.0)
MCH: 25.4 pg — ABNORMAL LOW (ref 26.0–34.0)
MCHC: 30.4 g/dL (ref 30.0–36.0)
MCV: 83.7 fL (ref 80.0–100.0)
Platelets: 358 10*3/uL (ref 150–400)
RBC: 4.41 MIL/uL (ref 3.87–5.11)
RDW: 16 % — ABNORMAL HIGH (ref 11.5–15.5)
WBC: 3.8 10*3/uL — ABNORMAL LOW (ref 4.0–10.5)
nRBC: 0 % (ref 0.0–0.2)

## 2019-02-21 LAB — URINE DRUG SCREEN, QUALITATIVE (ARMC ONLY)
Amphetamines, Ur Screen: NOT DETECTED
Barbiturates, Ur Screen: NOT DETECTED
Benzodiazepine, Ur Scrn: NOT DETECTED
Cannabinoid 50 Ng, Ur ~~LOC~~: NOT DETECTED
Cocaine Metabolite,Ur ~~LOC~~: POSITIVE — AB
MDMA (Ecstasy)Ur Screen: NOT DETECTED
Methadone Scn, Ur: NOT DETECTED
Opiate, Ur Screen: NOT DETECTED
Phencyclidine (PCP) Ur S: NOT DETECTED
Tricyclic, Ur Screen: NOT DETECTED

## 2019-02-21 LAB — LIPASE, BLOOD: Lipase: 29 U/L (ref 11–51)

## 2019-02-21 MED ORDER — MAGNESIUM CITRATE PO SOLN: 1.0000 | Freq: Once | ORAL | 1 refills | Status: AC

## 2019-02-21 MED ORDER — POLYETHYLENE GLYCOL 3350 17 G PO PACK: 17.0000 g | PACK | Freq: Every day | ORAL | 0 refills | Status: DC

## 2019-02-21 MED ORDER — DICYCLOMINE HCL 10 MG/ML IM SOLN
20.0000 mg | Freq: Once | INTRAMUSCULAR | Status: AC
  Administered 2019-02-21: 20 mg via INTRAMUSCULAR
  Filled 2019-02-21 (×2): qty 2

## 2019-02-21 NOTE — Discharge Instructions (Signed)
Please seek medical attention for any high fevers, chest pain, shortness of breath, change in behavior, persistent vomiting, bloody stool or any other new or concerning symptoms.  

## 2019-02-21 NOTE — ED Notes (Signed)
Patient to waiting room via wheelchair by EMS.  Per EMS patient with complaint abdominal pain due to constipation, recently started taking Bentyl..  EMS vital signs - bp 142/95, hr 67, pulse oxi 100% on room air.

## 2019-02-21 NOTE — ED Provider Notes (Signed)
[facility] Emergency Department Provider Note   ____________________________________________   I have reviewed the triage vital signs and the nursing notes.   HISTORY  Chief Complaint Abdominal pain  History limited by: Not Limited   HPI Katelyn Lane is a 64 y.o. female who presents to the emergency department today with primary came plaint of abdominal pain.  She states she feels like her abdomen is balling up.  She had similar symptoms and a recent admission for the symptoms.  She states that she is try taking the Bentyl that they discharged her with however she feels like it makes her sicker.  The patient also states she has been having some constipation.  She states she has tried a stool softener but no laxatives.  She denies any fevers.  Records reviewed. Per medical record review patient has a history of recent admission for similar symptoms.   Past Medical History:  Diagnosis Date  . CHF (congestive heart failure) ([place])   . Hyperlipidemia   . Renal disorder     Patient Active Problem List   Diagnosis Date Noted  . Nausea and vomiting 02/16/2019  . Abdominal pain 02/16/2019  . Rhabdomyolysis 02/16/2019  . Acute renal failure superimposed on stage 3 chronic kidney disease ([place]) 02/16/2019  . Cocaine use 02/16/2019  . Tobacco abuse 02/16/2019  . Acute on chronic systolic CHF (congestive heart failure) ([place]) 02/16/2019  . CHF exacerbation ([place]) 02/13/2019  . Respiratory failure with hypoxia ([place]) 01/31/2019  . Stroke ([place]) 01/18/2019  . Dilated cardiomyopathy ([place]) 12/31/2018  . Chest tightness 12/31/2018  . Elevated troponin 12/31/2018  . Hypertensive urgency 12/31/2018  . Dyslipidemia 12/31/2018  . Depression 12/31/2018  . HFrEF (heart failure with reduced ejection fraction) ([place]) 12/29/2018    Past Surgical History:  Procedure Laterality Date  . RIGHT/LEFT HEART CATH AND CORONARY ANGIOGRAPHY N/A 12/30/2018   Procedure: RIGHT/LEFT  HEART CATH AND CORONARY ANGIOGRAPHY;  Surgeon: Corey Skains, MD;  Location: [facility];  Service: Cardiovascular;  Laterality: N/A;    Prior to Admission medications   Medication Sig Start Date End Date Taking? Authorizing Provider  albuterol (VENTOLIN HFA) 108 (90 Base) MCG/ACT inhaler Inhale 2 puffs into the lungs every 6 (six) hours as needed for wheezing or shortness of breath. 01/15/19   Rudene Re, MD  alum & mag hydroxide-simeth (MAALOX/MYLANTA) 200-200-20 MG/5ML suspension Take 30 mLs by mouth every 4 (four) hours as needed for indigestion or heartburn. 02/16/19   Dhungel, Flonnie Overman, MD  apixaban (ELIQUIS) 5 MG TABS tablet Take 5 mg by mouth 2 (two) times daily.    [provider]  atorvastatin (LIPITOR) 20 MG tablet Take 20 mg by mouth daily at 6 PM.     [provider]  carvedilol (COREG) 3.125 MG tablet Take 1 tablet (3.125 mg total) by mouth 2 (two) times daily with a meal. 02/16/19   Dhungel, Nishant, MD  dicyclomine (BENTYL) 20 MG tablet Take 1 tablet (20 mg total) by mouth 4 (four) times daily -  before meals and at bedtime. 02/16/19   Dhungel, Flonnie Overman, MD  FLUoxetine (PROZAC) 40 MG capsule Take 40 mg by mouth daily.     [provider]  furosemide (LASIX) 40 MG tablet Take 1 tablet (40 mg total) by mouth daily. 01/21/19 03/22/19  Lavina Hamman, MD  loratadine (CLARITIN) 10 MG tablet Take 1 tablet (10 mg total) by mouth daily. Patient taking differently: Take 10 mg by mouth daily as needed for itching.  01/02/19   Ezekiel Slocumb, DO  nicotine (NICODERM CQ - DOSED IN MG/24 HOURS) 21 mg/24hr patch Place 1 patch (21 mg total) onto the skin daily. 02/17/19   Dhungel, Flonnie Overman, MD  nitroGLYCERIN (NITROSTAT) 0.4 MG SL tablet Place 1 tablet (0.4 mg total) under the tongue every 5 (five) minutes as needed for chest pain. 02/16/19   Dhungel, Flonnie Overman, MD  spironolactone (ALDACTONE) 25 MG tablet Take 0.5 tablets (12.5 mg total) by mouth daily. 01/02/19    Ezekiel Slocumb, DO  traZODone (DESYREL) 100 MG tablet Take 150 mg by mouth at bedtime as needed for sleep.  09/08/13   [provider]    Allergies Penicillins and Ace inhibitors  Family History  Problem Relation Age of Onset  . Breast cancer Neg Hx     Social History Social History   Tobacco Use  . Smoking status: Current Every Day Smoker  . Smokeless tobacco: Never Used  Substance Use Topics  . Alcohol use: No    Alcohol/week: 0.0 standard drinks  . Drug use: Yes    Types: Cocaine    Review of Systems Constitutional: No fever/chills Eyes: No visual changes. ENT: No sore throat. Cardiovascular: Denies chest pain. Respiratory: Denies shortness of breath. Gastrointestinal: Positive for abdominal pain, nausea, vomiting and constipation.   Genitourinary: Negative for dysuria. Musculoskeletal: Negative for back pain. Skin: Negative for rash. Neurological: Negative for headaches, focal weakness or numbness.  ____________________________________________   PHYSICAL EXAM:  VITAL SIGNS: ED Triage Vitals  Enc Vitals Group     BP 02/21/19 2102 (!) 151/96     Pulse Rate 02/21/19 2102 65     Resp 02/21/19 2102 18     Temp 02/21/19 2102 (!) 97.5 F (36.4 C)     Temp Source 02/21/19 2102 Oral     SpO2 02/21/19 2102 98 %     Weight 02/21/19 2058 215 lb (97.5 kg)     Height 02/21/19 2058 _0  (1.702 m)     Head Circumference --      Peak Flow --      Pain Score 02/21/19 2058 9   Constitutional: Alert and oriented.  Eyes: Conjunctivae are normal.  ENT      Head: Normocephalic and atraumatic.      Nose: No congestion/rhinnorhea.      Mouth/Throat: Mucous membranes are moist.      Neck: No stridor. Hematological/Lymphatic/Immunilogical: No cervical lymphadenopathy. Cardiovascular: Normal rate, regular rhythm.  No murmurs, rubs, or gallops.  Respiratory: Normal respiratory effort without tachypnea nor retractions. Breath sounds are clear and equal bilaterally.  No wheezes/rales/rhonchi. Gastrointestinal: Soft and non tender. No rebound. No guarding.  Genitourinary: Deferred Musculoskeletal: Normal range of motion in all extremities. No lower extremity edema. Neurologic:  Normal speech and language. No gross focal neurologic deficits are appreciated.  Skin:  Skin is warm, dry and intact. No rash noted. Psychiatric: Mood and affect are normal. Speech and behavior are normal. Patient exhibits appropriate insight and judgment.  ____________________________________________    LABS (pertinent positives/negatives)  CMP na 139, k 4.1, glu 107, cr 2.03, ast 143, alt 162, alk phos 134, t bili 0.7 Lipase 29 CBC wbc 3.8, hgb 11.2, plt 358 UA clear, protein 100, wbc 0-5, rare bacteria ____________________________________________   EKG  I, Nance Pear, attending physician, personally viewed and interpreted this EKG  EKG Time: 2120 Rate: 74 Rhythm: sinus rhythm Axis: left axis deviation Intervals: qtc 526 QRS: LVH ST changes: no st elevation Impression: abnormal ekg  ____________________________________________    RADIOLOGY  Abd x-ray No concerning gas patter/free air  ____________________________________________   PROCEDURES  Procedures  ____________________________________________   INITIAL IMPRESSION / ASSESSMENT AND PLAN / ED COURSE  Pertinent labs & imaging results that were available during my care of the patient were reviewed by me and considered in my medical decision making (see chart for details).   Patient presented to the emergency department today with continued abdominal pain.  Patient had a recent admission for the same symptoms.  Work-up today without concerning leukocytosis.  Did get x-ray of the abdomen which not show any abnormal air patterns that would suggest obstruction or volvulus.  No free air.  This time given the patient is all concerned about constipation will give prescription for laxatives.   Discussed findings and plan with patient.  ____________________________________________   FINAL CLINICAL IMPRESSION(S) / ED DIAGNOSES  Final diagnoses:  Abdominal pain, unspecified abdominal location  Constipation, unspecified constipation type     Note: This dictation was prepared with Dragon dictation. Any transcriptional errors that result from this process are unintentional     Nance Pear, MD 02/21/19 2355

## 2019-02-21 NOTE — ED Triage Notes (Signed)
Pt states that she has not had a BM x3 days and that she is experiencing lots of pressure on her stomach. Pt states that she has been experiencing emesis as well. Pt also reports given Bentyl without any relief.

## 2019-02-22 ENCOUNTER — Emergency Department
Admission: EM | Admit: 2019-02-22 | Discharge: 2019-02-22 | Disposition: A | Discharge status: Home / Self Care | Payer: Medicaid Other | Source: Home / Self Care | Attending: Emergency Medicine | Admitting: Emergency Medicine

## 2019-02-22 ENCOUNTER — Other Ambulatory Visit: Payer: Self-pay

## 2019-02-22 ENCOUNTER — Encounter: Payer: Self-pay | Admitting: Emergency Medicine

## 2019-02-22 DIAGNOSIS — Z8673 Personal history of transient ischemic attack (TIA), and cerebral infarction without residual deficits: Secondary | ICD-10-CM | POA: Insufficient documentation

## 2019-02-22 DIAGNOSIS — N183 Chronic kidney disease, stage 3 unspecified: Secondary | ICD-10-CM | POA: Insufficient documentation

## 2019-02-22 DIAGNOSIS — R1084 Generalized abdominal pain: Secondary | ICD-10-CM | POA: Insufficient documentation

## 2019-02-22 DIAGNOSIS — I130 Hypertensive heart and chronic kidney disease with heart failure and stage 1 through stage 4 chronic kidney disease, or unspecified chronic kidney disease: Secondary | ICD-10-CM | POA: Insufficient documentation

## 2019-02-22 DIAGNOSIS — F1410 Cocaine abuse, uncomplicated: Secondary | ICD-10-CM | POA: Insufficient documentation

## 2019-02-22 DIAGNOSIS — Z7901 Long term (current) use of anticoagulants: Secondary | ICD-10-CM | POA: Insufficient documentation

## 2019-02-22 DIAGNOSIS — I5043 Acute on chronic combined systolic (congestive) and diastolic (congestive) heart failure: Secondary | ICD-10-CM | POA: Insufficient documentation

## 2019-02-22 DIAGNOSIS — F17210 Nicotine dependence, cigarettes, uncomplicated: Secondary | ICD-10-CM | POA: Insufficient documentation

## 2019-02-22 DIAGNOSIS — Z79899 Other long term (current) drug therapy: Secondary | ICD-10-CM | POA: Insufficient documentation

## 2019-02-22 MED ORDER — DICYCLOMINE HCL 10 MG PO CAPS: 10.0000 mg | ORAL_CAPSULE | Freq: Three times a day (TID) | ORAL | 0 refills | Status: DC | PRN

## 2019-02-22 MED ORDER — HYDROMORPHONE HCL 1 MG/ML IJ SOLN
1.0000 mg | Freq: Once | INTRAMUSCULAR | Status: AC
  Administered 2019-02-22: 05:00:00 1 mg via INTRAMUSCULAR
  Filled 2019-02-22: qty 1

## 2019-02-22 NOTE — ED Notes (Signed)
Pt st she feels better and the medicine helped her cramps

## 2019-02-22 NOTE — ED Provider Notes (Signed)
[facility] Emergency Department Provider Note  Time seen: 4:15 AM  I have reviewed the triage vital signs and the nursing notes.   HISTORY  Chief Complaint Abdominal Pain   HPI Katelyn Lane is a 64 y.o. female with a past medical history of CHF, hyperlipidemia, substance abuse, presents to the emergency department for abdominal pain.  According to the patient she has been experiencing abdominal pain over the past several weeks but has gotten worse over the past day or 2.  Patient was seen in the emergency department earlier tonight with essentially a negative work-up including x-ray and lab work.  I reviewed the patient's record she had a negative CT scan approximately 1 week ago for the same discomfort per patient.  Denies any fever or shortness of breath.  Patient states since she was discharged earlier she has been waiting in the waiting room but cannot get the pain to go away so she came back to the emergency department.   Past Medical History:  Diagnosis Date  . CHF (congestive heart failure) ([place])   . Hyperlipidemia   . Renal disorder     Patient Active Problem List   Diagnosis Date Noted  . Nausea and vomiting 02/16/2019  . Abdominal pain 02/16/2019  . Rhabdomyolysis 02/16/2019  . Acute renal failure superimposed on stage 3 chronic kidney disease ([place]) 02/16/2019  . Cocaine use 02/16/2019  . Tobacco abuse 02/16/2019  . Acute on chronic systolic CHF (congestive heart failure) ([place]) 02/16/2019  . CHF exacerbation ([place]) 02/13/2019  . Respiratory failure with hypoxia ([place]) 01/31/2019  . Stroke ([place]) 01/18/2019  . Dilated cardiomyopathy ([place]) 12/31/2018  . Chest tightness 12/31/2018  . Elevated troponin 12/31/2018  . Hypertensive urgency 12/31/2018  . Dyslipidemia 12/31/2018  . Depression 12/31/2018  . HFrEF (heart failure with reduced ejection fraction) ([place]) 12/29/2018    Past Surgical History:  Procedure Laterality Date  . RIGHT/LEFT HEART  CATH AND CORONARY ANGIOGRAPHY N/A 12/30/2018   Procedure: RIGHT/LEFT HEART CATH AND CORONARY ANGIOGRAPHY;  Surgeon: Corey Skains, MD;  Location: [facility];  Service: Cardiovascular;  Laterality: N/A;    Prior to Admission medications   Medication Sig Start Date End Date Taking? Authorizing Provider  albuterol (VENTOLIN HFA) 108 (90 Base) MCG/ACT inhaler Inhale 2 puffs into the lungs every 6 (six) hours as needed for wheezing or shortness of breath. 01/15/19   Rudene Re, MD  alum & mag hydroxide-simeth (MAALOX/MYLANTA) 200-200-20 MG/5ML suspension Take 30 mLs by mouth every 4 (four) hours as needed for indigestion or heartburn. 02/16/19   Dhungel, Flonnie Overman, MD  apixaban (ELIQUIS) 5 MG TABS tablet Take 5 mg by mouth 2 (two) times daily.    [provider]  atorvastatin (LIPITOR) 20 MG tablet Take 20 mg by mouth daily at 6 PM.     [provider]  carvedilol (COREG) 3.125 MG tablet Take 1 tablet (3.125 mg total) by mouth 2 (two) times daily with a meal. 02/16/19   Dhungel, Nishant, MD  dicyclomine (BENTYL) 20 MG tablet Take 1 tablet (20 mg total) by mouth 4 (four) times daily -  before meals and at bedtime. 02/16/19   Dhungel, Flonnie Overman, MD  FLUoxetine (PROZAC) 40 MG capsule Take 40 mg by mouth daily.     [provider]  furosemide (LASIX) 40 MG tablet Take 1 tablet (40 mg total) by mouth daily. 01/21/19 03/22/19  Lavina Hamman, MD  loratadine (CLARITIN) 10 MG tablet Take 1 tablet (10 mg total)  by mouth daily. Patient taking differently: Take 10 mg by mouth daily as needed for itching.  01/02/19   Nicole Kindred A, DO  magnesium citrate SOLN Take 296 mLs (1 Bottle total) by mouth once for 1 dose. 02/22/19 02/22/19  Nance Pear, MD  nicotine (NICODERM CQ - DOSED IN MG/24 HOURS) 21 mg/24hr patch Place 1 patch (21 mg total) onto the skin daily. 02/17/19   Dhungel, Flonnie Overman, MD  nitroGLYCERIN (NITROSTAT) 0.4 MG SL tablet Place 1 tablet (0.4 mg total) under the  tongue every 5 (five) minutes as needed for chest pain. 02/16/19   Dhungel, Nishant, MD  polyethylene glycol (MIRALAX) 17 g packet Take 17 g by mouth daily. 02/21/19   Nance Pear, MD  spironolactone (ALDACTONE) 25 MG tablet Take 0.5 tablets (12.5 mg total) by mouth daily. 01/02/19   Ezekiel Slocumb, DO  traZODone (DESYREL) 100 MG tablet Take 150 mg by mouth at bedtime as needed for sleep.  09/08/13   [provider]    Allergies  Allergen Reactions  . Penicillins Hives, Rash and Other (See Comments)    Did it involve swelling of the face/tongue/throat, SOB, or low BP? No Did it involve sudden or severe rash/hives, skin peeling, or any reaction on the inside of your mouth or nose? Yes Did you need to seek medical attention at a hospital or doctor's office? Yes When did it last happen? >25 years If all above answers are "NO", may proceed with cephalosporin use.   . Ace Inhibitors Nausea And Vomiting    Family History  Problem Relation Age of Onset  . Breast cancer Neg Hx     Social History Social History   Tobacco Use  . Smoking status: Current Every Day Smoker  . Smokeless tobacco: Never Used  Substance Use Topics  . Alcohol use: No    Alcohol/week: 0.0 standard drinks  . Drug use: Yes    Types: Cocaine    Review of Systems Constitutional: Negative for fever. Cardiovascular: Negative for chest pain. Respiratory: Negative for shortness of breath. Gastrointestinal: Positive for diffuse abdominal pain, states her abdomen is "balled up." Musculoskeletal: Negative for musculoskeletal complaints Neurological: Negative for headache All other ROS negative  ____________________________________________   PHYSICAL EXAM:  VITAL SIGNS: ED Triage Vitals  Enc Vitals Group     BP 02/22/19 0356 118/73     Pulse Rate 02/22/19 0356 77     Resp 02/22/19 0356 18     Temp 02/22/19 0356 (!) 97.5 F (36.4 C)     Temp src --      SpO2 02/22/19 0356 98 %     Weight  02/22/19 0355 215 lb (97.5 kg)     Height 02/22/19 0355 5\' 6"  (1.676 m)     Head Circumference --      Peak Flow --      Pain Score 02/22/19 0355 9     Pain Loc --      Pain Edu? --      Excl. in [place]? --    Constitutional: Alert and oriented.  Eyes: Normal exam ENT      Head: Normocephalic and atraumatic.      Mouth/Throat: Mucous membranes are moist. Cardiovascular: Normal rate, regular rhythm.  Respiratory: Normal respiratory effort without tachypnea nor retractions. Breath sounds are clear  Gastrointestinal: Soft, no reaction to abdominal palpation. Musculoskeletal: Nontender with normal range of motion in all extremities. No lower extremity tenderness or edema. Neurologic:  Normal speech and language. No  gross focal neurologic deficits  Skin:  Skin is warm, dry and intact.  Psychiatric: Mood and affect are normal.  ____________________________________________   INITIAL IMPRESSION / ASSESSMENT AND PLAN / ED COURSE  Pertinent labs & imaging results that were available during my care of the patient were reviewed by me and considered in my medical decision making (see chart for details).   Patient presents to the emergency department for abdominal discomfort.  Just discharged from the emergency department several hours ago for the same.  I reviewed the patient's lab work which was largely nonrevealing, x-ray largely nonrevealing and recent CT scan largely nonrevealing.  Overall the patient appears well, no reaction to abdominal palpation.  We will dose pain medication and continue to closely monitor.  Patient agreeable to plan of care.  Pain appears to be improved.  We will discharge patient home with PCP follow-up.  Provided my normal abdominal pain return precautions.  Ramie Besel was evaluated in Emergency Department on 02/22/2019 for the symptoms described in the history of present illness. She was evaluated in the context of the global COVID-19 pandemic, which necessitated  consideration that the patient might be at risk for infection with the SARS-CoV-2 virus that causes COVID-19. Institutional protocols and algorithms that pertain to the evaluation of patients at risk for COVID-19 are in a state of rapid change based on information released by regulatory bodies including the CDC and federal and state organizations. These policies and algorithms were followed during the patient's care in the ED.  ____________________________________________   FINAL CLINICAL IMPRESSION(S) / ED DIAGNOSES  Abdominal pain   Harvest Dark, MD 02/22/19 [phone]

## 2019-02-22 NOTE — ED Triage Notes (Signed)
Patient with complaint of generalized abdominal pain that started about 2-3 days ago. Patient states that she was seen and discharged this evening. Patient states that she has been waiting in the lobby for a ride.

## 2019-02-23 ENCOUNTER — Telehealth: Payer: Self-pay | Admitting: Gastroenterology

## 2019-02-23 NOTE — Telephone Encounter (Signed)
Called pt to offer ED f/u  Apt. Pt hung up on me

## 2019-02-23 NOTE — Progress Notes (Deleted)
   Patient ID: Katelyn Lane, female    DOB: 25-Sep-1955, 64 y.o.   MRN: [id number]  HPI  Katelyn Lane is a 64 y/o female with a history of   Echo report from 01/19/19 reviewed and showed an EF of <20% along with a large, fixed thrombus on the lateral wall of the left ventricle, bilaterally enlarged atria and moderately elevated PA pressure.    Catheterization done 12/30/2018 which showed:  Mid RCA lesion is 10% stenosed.  Prox Cx lesion is 30% stenosed.  Hemodynamic findings consistent with moderate pulmonary hypertension.  Was in the ED 02/22/19 due to abdominal pain where she was evaluated and released. Was in the ED 02/21/19 due to abdominal pain. Abdominal xray obtained which was negative and she was released. Admitted 02/13/19 due to abdominal pain and acute on chronic heart failure. Abdominal/ pelvic CT was negative. Initially given IV lasix and then transitioned to oral diuretics. 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 Discharged after 3 days. Was in the ED 02/11/19 due to shortness of breath and chest pain. CXR negative for pulmonary edema. Patient released. In January 2021, she was admitted twice and in the ED twice.   She presents today for her initial visit with a chief complaint of  Review of Systems    Physical Exam  Assessment & Plan:  1: Chronic heart failure with reduced ejection fraction- - NYHA class - saw cardiology Humphrey Rolls) - has nausea/ vomiting with ACEi - BNP 02/13/19 was 2135.0 - due to numerous admissions/ ED visits over the last couple of months, will make referral to paramedicine program  2: HTN- - BP - saw PCP - BMP 02/21/19 reviewed and showed sodium 139, potassium 4.1, creatinine 2.03 and GFR 29 - saw nephrology ([place]) 11/03/2018   3: Tobacco use-  4: Chronic LV thrombus- - on apixaban

## 2019-02-24 ENCOUNTER — Ambulatory Visit: Payer: Medicaid Other | Admitting: Family

## 2019-03-02 ENCOUNTER — Other Ambulatory Visit: Payer: Self-pay

## 2019-03-02 ENCOUNTER — Emergency Department
Admission: EM | Admit: 2019-03-02 | Discharge: 2019-03-02 | Disposition: A | Discharge status: Home / Self Care | Payer: Medicaid Other | Attending: Emergency Medicine | Admitting: Emergency Medicine

## 2019-03-02 ENCOUNTER — Encounter: Payer: Self-pay | Admitting: Emergency Medicine

## 2019-03-02 DIAGNOSIS — F1410 Cocaine abuse, uncomplicated: Secondary | ICD-10-CM | POA: Insufficient documentation

## 2019-03-02 DIAGNOSIS — K922 Gastrointestinal hemorrhage, unspecified: Secondary | ICD-10-CM | POA: Insufficient documentation

## 2019-03-02 DIAGNOSIS — Z8673 Personal history of transient ischemic attack (TIA), and cerebral infarction without residual deficits: Secondary | ICD-10-CM | POA: Diagnosis not present

## 2019-03-02 DIAGNOSIS — Z7901 Long term (current) use of anticoagulants: Secondary | ICD-10-CM | POA: Insufficient documentation

## 2019-03-02 DIAGNOSIS — F17200 Nicotine dependence, unspecified, uncomplicated: Secondary | ICD-10-CM | POA: Diagnosis not present

## 2019-03-02 DIAGNOSIS — K625 Hemorrhage of anus and rectum: Secondary | ICD-10-CM | POA: Diagnosis present

## 2019-03-02 DIAGNOSIS — Z79899 Other long term (current) drug therapy: Secondary | ICD-10-CM | POA: Diagnosis not present

## 2019-03-02 DIAGNOSIS — F1210 Cannabis abuse, uncomplicated: Secondary | ICD-10-CM | POA: Diagnosis not present

## 2019-03-02 LAB — CBC WITH DIFFERENTIAL/PLATELET
Abs Immature Granulocytes: 0.02 10*3/uL (ref 0.00–0.07)
Basophils Absolute: 0 10*3/uL (ref 0.0–0.1)
Basophils Relative: 1 %
Eosinophils Absolute: 0.1 10*3/uL (ref 0.0–0.5)
Eosinophils Relative: 3 %
HCT: 36.8 % (ref 36.0–46.0)
Hemoglobin: 11.2 g/dL — ABNORMAL LOW (ref 12.0–15.0)
Immature Granulocytes: 1 %
Lymphocytes Relative: 18 %
Lymphs Abs: 0.8 10*3/uL (ref 0.7–4.0)
MCH: 24.5 pg — ABNORMAL LOW (ref 26.0–34.0)
MCHC: 30.4 g/dL (ref 30.0–36.0)
MCV: 80.5 fL (ref 80.0–100.0)
Monocytes Absolute: 0.6 10*3/uL (ref 0.1–1.0)
Monocytes Relative: 13 %
Neutro Abs: 2.9 10*3/uL (ref 1.7–7.7)
Neutrophils Relative %: 64 %
Platelets: 329 10*3/uL (ref 150–400)
RBC: 4.57 MIL/uL (ref 3.87–5.11)
RDW: 17.1 % — ABNORMAL HIGH (ref 11.5–15.5)
WBC: 4.4 10*3/uL (ref 4.0–10.5)
nRBC: 0 % (ref 0.0–0.2)

## 2019-03-02 LAB — URINE DRUG SCREEN, QUALITATIVE (ARMC ONLY)
Amphetamines, Ur Screen: NOT DETECTED
Barbiturates, Ur Screen: NOT DETECTED
Benzodiazepine, Ur Scrn: NOT DETECTED
Cannabinoid 50 Ng, Ur ~~LOC~~: POSITIVE — AB
Cocaine Metabolite,Ur ~~LOC~~: POSITIVE — AB
MDMA (Ecstasy)Ur Screen: NOT DETECTED
Methadone Scn, Ur: NOT DETECTED
Opiate, Ur Screen: NOT DETECTED
Phencyclidine (PCP) Ur S: NOT DETECTED
Tricyclic, Ur Screen: NOT DETECTED

## 2019-03-02 LAB — COMPREHENSIVE METABOLIC PANEL
ALT: 182 U/L — ABNORMAL HIGH (ref 0–44)
AST: 146 U/L — ABNORMAL HIGH (ref 15–41)
Albumin: 2.9 g/dL — ABNORMAL LOW (ref 3.5–5.0)
Alkaline Phosphatase: 241 U/L — ABNORMAL HIGH (ref 38–126)
Anion gap: 10 (ref 5–15)
BUN: 36 mg/dL — ABNORMAL HIGH (ref 8–23)
CO2: 23 mmol/L (ref 22–32)
Calcium: 8.4 mg/dL — ABNORMAL LOW (ref 8.9–10.3)
Chloride: 110 mmol/L (ref 98–111)
Creatinine, Ser: 1.87 mg/dL — ABNORMAL HIGH (ref 0.44–1.00)
GFR calc Af Amer: 33 mL/min — ABNORMAL LOW (ref >60)
GFR calc non Af Amer: 28 mL/min — ABNORMAL LOW (ref >60)
Glucose, Bld: 116 mg/dL — ABNORMAL HIGH (ref 70–99)
Potassium: 3.5 mmol/L (ref 3.5–5.1)
Sodium: 143 mmol/L (ref 135–145)
Total Bilirubin: 1 mg/dL (ref 0.3–1.2)
Total Protein: 6 g/dL — ABNORMAL LOW (ref 6.5–8.1)

## 2019-03-02 LAB — LIPASE, BLOOD: Lipase: 41 U/L (ref 11–51)

## 2019-03-02 MED ORDER — LORAZEPAM 2 MG/ML IJ SOLN
1.0000 mg | Freq: Once | INTRAMUSCULAR | Status: AC
  Administered 2019-03-02: 1 mg via INTRAMUSCULAR
  Filled 2019-03-02: qty 1

## 2019-03-02 NOTE — ED Notes (Signed)
Pt provided apple juice per request upon discharge.

## 2019-03-02 NOTE — ED Triage Notes (Signed)
Pt arrives via [facility] with c/o bloody stool x 3 days. Pt states that she started eliquis at the same time. Per EMS, pt has BP 135/88, 97% RA, and pulse rate in the 80's. Pt is in NAD.

## 2019-03-02 NOTE — ED Notes (Signed)
IV attempted x 1 by April RN. Pt states that "they have a hard tome getting me". IV team consult will be placed per MD.

## 2019-03-02 NOTE — ED Provider Notes (Signed)
[facility] Emergency Department Provider Note  ____________________________________________   First MD Initiated Contact with Patient 03/02/19 0423     (approximate)  I have reviewed the triage vital signs and the nursing notes.   HISTORY  Chief Complaint Rectal Bleeding    HPI Katelyn Lane is a 64 y.o. female with below list of previous medical conditions presents to the emergency department via EMS stating as she has had bloody stools for the past 3 days.  Patient states that she was started on Eliquis 3 days ago however she does not know why.  Patient denies any abdominal pain at present.  Patient denies any fever no nausea or vomiting.  Patient denies any urinary symptoms.  Patient does state last  cocaine use was 3 days ago        Past Medical History:  Diagnosis Date  . CHF (congestive heart failure) ([place])   . Hyperlipidemia   . Renal disorder     Patient Active Problem List   Diagnosis Date Noted  . Nausea and vomiting 02/16/2019  . Abdominal pain 02/16/2019  . Rhabdomyolysis 02/16/2019  . Acute renal failure superimposed on stage 3 chronic kidney disease ([place]) 02/16/2019  . Cocaine use 02/16/2019  . Tobacco abuse 02/16/2019  . Acute on chronic systolic CHF (congestive heart failure) ([place]) 02/16/2019  . CHF exacerbation ([place]) 02/13/2019  . Respiratory failure with hypoxia ([place]) 01/31/2019  . Stroke ([place]) 01/18/2019  . Dilated cardiomyopathy ([place]) 12/31/2018  . Chest tightness 12/31/2018  . Elevated troponin 12/31/2018  . Hypertensive urgency 12/31/2018  . Dyslipidemia 12/31/2018  . Depression 12/31/2018  . HFrEF (heart failure with reduced ejection fraction) ([place]) 12/29/2018    Past Surgical History:  Procedure Laterality Date  . RIGHT/LEFT HEART CATH AND CORONARY ANGIOGRAPHY N/A 12/30/2018   Procedure: RIGHT/LEFT HEART CATH AND CORONARY ANGIOGRAPHY;  Surgeon: Corey Skains, MD;  Location: [facility];  Service:  Cardiovascular;  Laterality: N/A;    Prior to Admission medications   Medication Sig Start Date End Date Taking? Authorizing Provider  albuterol (VENTOLIN HFA) 108 (90 Base) MCG/ACT inhaler Inhale 2 puffs into the lungs every 6 (six) hours as needed for wheezing or shortness of breath. 01/15/19   Rudene Re, MD  alum & mag hydroxide-simeth (MAALOX/MYLANTA) 200-200-20 MG/5ML suspension Take 30 mLs by mouth every 4 (four) hours as needed for indigestion or heartburn. 02/16/19   Dhungel, Flonnie Overman, MD  apixaban (ELIQUIS) 5 MG TABS tablet Take 5 mg by mouth 2 (two) times daily.    [provider]  atorvastatin (LIPITOR) 20 MG tablet Take 20 mg by mouth daily at 6 PM.     [provider]  carvedilol (COREG) 3.125 MG tablet Take 1 tablet (3.125 mg total) by mouth 2 (two) times daily with a meal. 02/16/19   Dhungel, Nishant, MD  dicyclomine (BENTYL) 10 MG capsule Take 1 capsule (10 mg total) by mouth 3 (three) times daily as needed for spasms. 02/22/19   Harvest Dark, MD  FLUoxetine (PROZAC) 40 MG capsule Take 40 mg by mouth daily.     [provider]  furosemide (LASIX) 40 MG tablet Take 1 tablet (40 mg total) by mouth daily. 01/21/19 03/22/19  Lavina Hamman, MD  loratadine (CLARITIN) 10 MG tablet Take 1 tablet (10 mg total) by mouth daily. Patient taking differently: Take 10 mg by mouth daily as needed for itching.  01/02/19   Nicole Kindred A, DO  nicotine (NICODERM CQ - DOSED IN  MG/24 HOURS) 21 mg/24hr patch Place 1 patch (21 mg total) onto the skin daily. 02/17/19   Dhungel, Flonnie Overman, MD  nitroGLYCERIN (NITROSTAT) 0.4 MG SL tablet Place 1 tablet (0.4 mg total) under the tongue every 5 (five) minutes as needed for chest pain. 02/16/19   Dhungel, Nishant, MD  polyethylene glycol (MIRALAX) 17 g packet Take 17 g by mouth daily. 02/21/19   Nance Pear, MD  spironolactone (ALDACTONE) 25 MG tablet Take 0.5 tablets (12.5 mg total) by mouth daily. 01/02/19   Ezekiel Slocumb,  DO  traZODone (DESYREL) 100 MG tablet Take 150 mg by mouth at bedtime as needed for sleep.  09/08/13   [provider]    Allergies Penicillins and Ace inhibitors  Family History  Problem Relation Age of Onset  . Breast cancer Neg Hx     Social History Social History   Tobacco Use  . Smoking status: Current Every Day Smoker  . Smokeless tobacco: Never Used  Substance Use Topics  . Alcohol use: No    Alcohol/week: 0.0 standard drinks  . Drug use: Yes    Types: Cocaine    Comment: 4-5 days ago    Review of Systems Constitutional: No fever/chills Eyes: No visual changes. ENT: No sore throat. Cardiovascular: Denies chest pain. Respiratory: Denies shortness of breath. Gastrointestinal: No abdominal pain.  No nausea, no vomiting.  No diarrhea.  No constipation. Genitourinary: Negative for dysuria. Musculoskeletal: Negative for neck pain.  Negative for back pain. Integumentary: Negative for rash. Neurological: Negative for headaches, focal weakness or numbness. Psychiatric:  Positive for cocaine abuse  ____________________________________________   PHYSICAL EXAM:  VITAL SIGNS: ED Triage Vitals  Enc Vitals Group     BP 03/02/19 0431 116/71     Pulse Rate 03/02/19 0431 69     Resp 03/02/19 0431 18     Temp 03/02/19 0431 97.8 F (36.6 C)     Temp Source 03/02/19 0431 Oral     SpO2 03/02/19 0431 96 %     Weight 03/02/19 0424 97.5 kg (215 lb)     Height 03/02/19 0424 1.676 m (5\' 6" )     Head Circumference --      Peak Flow --      Pain Score 03/02/19 0424 9     Pain Loc --      Pain Edu? --      Excl. in [place]? --     Constitutional: Alert and oriented.  Agitated restless.  Appears intoxicated by stimulant Eyes: Conjunctivae are normal.  Head: Atraumatic. Mouth/Throat: Patient is wearing a mask. Neck: No stridor.  No meningeal signs.   Cardiovascular: Normal rate, regular rhythm. Good peripheral circulation. Grossly normal heart sounds. Respiratory:  Normal respiratory effort.  No retractions. Gastrointestinal: Soft and nontender. No distention.  Guaiac negative Musculoskeletal: No lower extremity tenderness nor edema. No gross deformities of extremities. Neurologic:  Normal speech and language. No gross focal neurologic deficits are appreciated.  Skin:  Skin is warm, dry and intact. Psychiatric: Agitated restless appears intoxicated  ____________________________________________   LABS (all labs ordered are listed, but only abnormal results are displayed)  Labs Reviewed  URINE DRUG SCREEN, QUALITATIVE ([place]) - Abnormal; Notable for the following components:      Result Value   Cocaine Metabolite,Ur [place] POSITIVE (*)    Cannabinoid 50 Ng, Ur [place] POSITIVE (*)    All other components within normal limits  CBC WITH DIFFERENTIAL/PLATELET  COMPREHENSIVE METABOLIC PANEL  LIPASE, BLOOD   ____________________________________________  EKG  ED ECG REPORT I, [place] N Jihad Brownlow, the attending physician, personally viewed and interpreted this ECG.   Date: 03/02/2019  EKG Time: 4:52 AM  Rate: 73  Rhythm: Normal sinus rhythm  Axis: Normal  Intervals: Normal  ST&T Change: None  ________________  PROCEDURES   Procedure(s) performed (including Critical Care):  Procedures   ____________________________________________   INITIAL IMPRESSION / MDM / ASSESSMENT AND PLAN / ED COURSE  As part of my medical decision making, I reviewed the following data within the electronic MEDICAL RECORD NUMBER   64 year old female presented with above-stated history and physical exam secondary to reported rectal bleeding.  Patient guaiac negative hemodynamically stable. Patient appears to be under the influence of stimulant at this time.  Patient very agitated with impulsive movements and as such suspect possible cocaine intoxication which was confirmed with UDS.  Patient given 1 mg IM Ativan with improvement of symptoms.     ____________________________________________  FINAL CLINICAL IMPRESSION(S) / ED DIAGNOSES  Final diagnoses:  Cocaine abuse ([place])     MEDICATIONS GIVEN DURING THIS VISIT:  Medications  LORazepam (ATIVAN) injection 1 mg (1 mg Intramuscular Given 03/02/19 0505)     ED Discharge Orders    None      *Please note:  Chatham Brea was evaluated in Emergency Department on 03/02/2019 for the symptoms described in the history of present illness. She was evaluated in the context of the global COVID-19 pandemic, which necessitated consideration that the patient might be at risk for infection with the SARS-CoV-2 virus that causes COVID-19. Institutional protocols and algorithms that pertain to the evaluation of patients at risk for COVID-19 are in a state of rapid change based on information released by regulatory bodies including the CDC and federal and state organizations. These policies and algorithms were followed during the patient's care in the ED.  Some ED evaluations and interventions may be delayed as a result of limited staffing during the pandemic.*  Note:  This document was prepared using Dragon voice recognition software and may include unintentional dictation errors.   Gregor Hams, MD 03/03/19 [phone]

## 2019-03-02 NOTE — ED Notes (Signed)
Pt ambulatory to toilet independently. 

## 2019-03-02 NOTE — ED Notes (Signed)
Pt up to the BR to have a bowel movement. Pt standing at door for this RN to see pt's BM. Stool appears normal at this time and small amount of drops of blood noted on toilet paper. Hema cult already performed by Owens Shark, MD with negative result.

## 2019-03-03 MED ORDER — SPIRONOLACTONE 25 MG PO TABS
12.50 | ORAL_TABLET | ORAL | Status: DC
Start: 2019-03-06 — End: 2019-03-03

## 2019-03-03 MED ORDER — DICYCLOMINE HCL 10 MG PO CAPS
10.00 | ORAL_CAPSULE | ORAL | Status: DC
Start: ? — End: 2019-03-03

## 2019-03-03 MED ORDER — CALCIUM CARBONATE ANTACID 500 MG PO CHEW
CHEWABLE_TABLET | ORAL | Status: DC
Start: ? — End: 2019-03-03

## 2019-03-03 MED ORDER — CARVEDILOL 3.125 MG PO TABS
3.13 | ORAL_TABLET | ORAL | Status: DC
Start: 2019-03-05 — End: 2019-03-03

## 2019-03-03 MED ORDER — ALBUTEROL SULFATE HFA 108 (90 BASE) MCG/ACT IN AERS
2.00 | INHALATION_SPRAY | RESPIRATORY_TRACT | Status: DC
Start: ? — End: 2019-03-03

## 2019-03-03 MED ORDER — ATORVASTATIN CALCIUM 20 MG PO TABS
20.00 | ORAL_TABLET | ORAL | Status: DC
Start: 2019-03-05 — End: 2019-03-03

## 2019-03-03 MED ORDER — APIXABAN 5 MG PO TABS
5.00 | ORAL_TABLET | ORAL | Status: DC
Start: 2019-03-03 — End: 2019-03-03

## 2019-03-03 MED ORDER — DIPHENHYDRAMINE HCL 25 MG PO CAPS
25.00 | ORAL_CAPSULE | ORAL | Status: DC
Start: ? — End: 2019-03-03

## 2019-03-03 MED ORDER — ACETAMINOPHEN 325 MG PO TABS
650.00 | ORAL_TABLET | ORAL | Status: DC
Start: ? — End: 2019-03-03

## 2019-03-03 MED ORDER — FLUOXETINE HCL 10 MG PO CAPS
20.00 | ORAL_CAPSULE | ORAL | Status: DC
Start: 2019-03-06 — End: 2019-03-03

## 2019-03-03 MED ORDER — MELATONIN 3 MG PO TABS
6.00 | ORAL_TABLET | ORAL | Status: DC
Start: ? — End: 2019-03-03

## 2019-03-03 MED ORDER — GUAIFENESIN 100 MG/5ML PO SYRP
200.00 | ORAL_SOLUTION | ORAL | Status: DC
Start: ? — End: 2019-03-03

## 2019-03-04 ENCOUNTER — Ambulatory Visit: Payer: Medicaid Other | Admitting: Family

## 2019-03-06 ENCOUNTER — Emergency Department: Payer: Medicaid Other

## 2019-03-06 ENCOUNTER — Encounter: Payer: Self-pay | Admitting: Emergency Medicine

## 2019-03-06 ENCOUNTER — Inpatient Hospital Stay
Admission: EM | Admit: 2019-03-06 | Discharge: 2019-03-10 | DRG: 177 | Disposition: A | Discharge status: Home / Self Care | Payer: Medicaid Other | Attending: Internal Medicine | Admitting: Internal Medicine

## 2019-03-06 ENCOUNTER — Other Ambulatory Visit: Payer: Self-pay

## 2019-03-06 DIAGNOSIS — J440 Chronic obstructive pulmonary disease with acute lower respiratory infection: Secondary | ICD-10-CM | POA: Diagnosis present

## 2019-03-06 DIAGNOSIS — Z7901 Long term (current) use of anticoagulants: Secondary | ICD-10-CM | POA: Diagnosis not present

## 2019-03-06 DIAGNOSIS — I130 Hypertensive heart and chronic kidney disease with heart failure and stage 1 through stage 4 chronic kidney disease, or unspecified chronic kidney disease: Secondary | ICD-10-CM | POA: Diagnosis present

## 2019-03-06 DIAGNOSIS — R778 Other specified abnormalities of plasma proteins: Secondary | ICD-10-CM

## 2019-03-06 DIAGNOSIS — I5023 Acute on chronic systolic (congestive) heart failure: Secondary | ICD-10-CM | POA: Diagnosis present

## 2019-03-06 DIAGNOSIS — Z880 Allergy status to penicillin: Secondary | ICD-10-CM

## 2019-03-06 DIAGNOSIS — Z8673 Personal history of transient ischemic attack (TIA), and cerebral infarction without residual deficits: Secondary | ICD-10-CM | POA: Diagnosis not present

## 2019-03-06 DIAGNOSIS — I420 Dilated cardiomyopathy: Secondary | ICD-10-CM | POA: Diagnosis present

## 2019-03-06 DIAGNOSIS — Z888 Allergy status to other drugs, medicaments and biological substances status: Secondary | ICD-10-CM | POA: Diagnosis not present

## 2019-03-06 DIAGNOSIS — F329 Major depressive disorder, single episode, unspecified: Secondary | ICD-10-CM | POA: Diagnosis present

## 2019-03-06 DIAGNOSIS — I639 Cerebral infarction, unspecified: Secondary | ICD-10-CM | POA: Diagnosis present

## 2019-03-06 DIAGNOSIS — Z720 Tobacco use: Secondary | ICD-10-CM | POA: Diagnosis present

## 2019-03-06 DIAGNOSIS — F17200 Nicotine dependence, unspecified, uncomplicated: Secondary | ICD-10-CM | POA: Diagnosis present

## 2019-03-06 DIAGNOSIS — Z79899 Other long term (current) drug therapy: Secondary | ICD-10-CM

## 2019-03-06 DIAGNOSIS — N1831 Chronic kidney disease, stage 3a: Secondary | ICD-10-CM | POA: Diagnosis not present

## 2019-03-06 DIAGNOSIS — J069 Acute upper respiratory infection, unspecified: Secondary | ICD-10-CM

## 2019-03-06 DIAGNOSIS — F319 Bipolar disorder, unspecified: Secondary | ICD-10-CM | POA: Diagnosis present

## 2019-03-06 DIAGNOSIS — J9601 Acute respiratory failure with hypoxia: Secondary | ICD-10-CM | POA: Diagnosis present

## 2019-03-06 DIAGNOSIS — I509 Heart failure, unspecified: Secondary | ICD-10-CM

## 2019-03-06 DIAGNOSIS — N1832 Chronic kidney disease, stage 3b: Secondary | ICD-10-CM | POA: Diagnosis present

## 2019-03-06 DIAGNOSIS — F1410 Cocaine abuse, uncomplicated: Secondary | ICD-10-CM | POA: Diagnosis present

## 2019-03-06 DIAGNOSIS — F32A Depression, unspecified: Secondary | ICD-10-CM | POA: Diagnosis present

## 2019-03-06 DIAGNOSIS — E876 Hypokalemia: Secondary | ICD-10-CM | POA: Diagnosis present

## 2019-03-06 DIAGNOSIS — J1282 Pneumonia due to coronavirus disease 2019: Secondary | ICD-10-CM | POA: Diagnosis present

## 2019-03-06 DIAGNOSIS — I513 Intracardiac thrombosis, not elsewhere classified: Secondary | ICD-10-CM | POA: Diagnosis present

## 2019-03-06 DIAGNOSIS — U071 COVID-19: Principal | ICD-10-CM

## 2019-03-06 DIAGNOSIS — E785 Hyperlipidemia, unspecified: Secondary | ICD-10-CM | POA: Diagnosis present

## 2019-03-06 DIAGNOSIS — N179 Acute kidney failure, unspecified: Secondary | ICD-10-CM | POA: Diagnosis present

## 2019-03-06 LAB — RESPIRATORY PANEL BY RT PCR (FLU A&B, COVID)
Influenza A by PCR: NEGATIVE
Influenza B by PCR: NEGATIVE
SARS Coronavirus 2 by RT PCR: POSITIVE — AB

## 2019-03-06 LAB — COMPREHENSIVE METABOLIC PANEL
ALT: 66 U/L — ABNORMAL HIGH (ref 0–44)
AST: 37 U/L (ref 15–41)
Albumin: 2.8 g/dL — ABNORMAL LOW (ref 3.5–5.0)
Alkaline Phosphatase: 174 U/L — ABNORMAL HIGH (ref 38–126)
Anion gap: 10 (ref 5–15)
BUN: 31 mg/dL — ABNORMAL HIGH (ref 8–23)
CO2: 25 mmol/L (ref 22–32)
Calcium: 8.5 mg/dL — ABNORMAL LOW (ref 8.9–10.3)
Chloride: 111 mmol/L (ref 98–111)
Creatinine, Ser: 1.78 mg/dL — ABNORMAL HIGH (ref 0.44–1.00)
GFR calc Af Amer: 35 mL/min — ABNORMAL LOW (ref >60)
GFR calc non Af Amer: 30 mL/min — ABNORMAL LOW (ref >60)
Glucose, Bld: 111 mg/dL — ABNORMAL HIGH (ref 70–99)
Potassium: 3.3 mmol/L — ABNORMAL LOW (ref 3.5–5.1)
Sodium: 146 mmol/L — ABNORMAL HIGH (ref 135–145)
Total Bilirubin: 1.1 mg/dL (ref 0.3–1.2)
Total Protein: 6.4 g/dL — ABNORMAL LOW (ref 6.5–8.1)

## 2019-03-06 LAB — CBC
HCT: 45.1 % (ref 36.0–46.0)
Hemoglobin: 13.5 g/dL (ref 12.0–15.0)
MCH: 24.5 pg — ABNORMAL LOW (ref 26.0–34.0)
MCHC: 29.9 g/dL — ABNORMAL LOW (ref 30.0–36.0)
MCV: 82 fL (ref 80.0–100.0)
Platelets: 317 10*3/uL (ref 150–400)
RBC: 5.5 MIL/uL — ABNORMAL HIGH (ref 3.87–5.11)
RDW: 17.2 % — ABNORMAL HIGH (ref 11.5–15.5)
WBC: 5.6 10*3/uL (ref 4.0–10.5)
nRBC: 0 % (ref 0.0–0.2)

## 2019-03-06 LAB — TROPONIN I (HIGH SENSITIVITY)
Troponin I (High Sensitivity): 65 ng/L — ABNORMAL HIGH (ref <18)
Troponin I (High Sensitivity): 63 ng/L — ABNORMAL HIGH (ref <18)

## 2019-03-06 LAB — BRAIN NATRIURETIC PEPTIDE: B Natriuretic Peptide: 2973 pg/mL — ABNORMAL HIGH (ref 0.0–100.0)

## 2019-03-06 MED ORDER — THIAMINE HCL 100 MG PO TABS
100.0000 mg | ORAL_TABLET | Freq: Every day | ORAL | Status: DC
  Administered 2019-03-07: 100 mg via ORAL
  Filled 2019-03-06: qty 1

## 2019-03-06 MED ORDER — ALBUTEROL SULFATE HFA 108 (90 BASE) MCG/ACT IN AERS
2.0000 | INHALATION_SPRAY | RESPIRATORY_TRACT | Status: DC | PRN
  Filled 2019-03-06: qty 6.7

## 2019-03-06 MED ORDER — ADULT MULTIVITAMIN W/MINERALS CH
1.0000 | ORAL_TABLET | Freq: Every day | ORAL | Status: DC
  Administered 2019-03-07: 1 via ORAL
  Filled 2019-03-06: qty 1

## 2019-03-06 MED ORDER — HYDRALAZINE HCL 50 MG PO TABS: 25.0000 mg | ORAL_TABLET | Freq: Three times a day (TID) | ORAL | Status: DC | PRN

## 2019-03-06 MED ORDER — IPRATROPIUM BROMIDE HFA 17 MCG/ACT IN AERS
2.0000 | INHALATION_SPRAY | RESPIRATORY_TRACT | Status: DC
  Administered 2019-03-06 – 2019-03-10 (×18): 2 via RESPIRATORY_TRACT
  Filled 2019-03-06 (×2): qty 12.9

## 2019-03-06 MED ORDER — DM-GUAIFENESIN ER 30-600 MG PO TB12
1.0000 | ORAL_TABLET | Freq: Two times a day (BID) | ORAL | Status: DC
  Administered 2019-03-06 – 2019-03-10 (×9): 1 via ORAL
  Filled 2019-03-06 (×9): qty 1

## 2019-03-06 MED ORDER — LORAZEPAM 2 MG/ML IJ SOLN: 0.0000 mg | Freq: Four times a day (QID) | INTRAMUSCULAR | Status: DC

## 2019-03-06 MED ORDER — MELATONIN 3 MG PO TABS
3.00 | ORAL_TABLET | ORAL | Status: DC
Start: 2019-03-05 — End: 2019-03-06

## 2019-03-06 MED ORDER — CARVEDILOL 3.125 MG PO TABS
3.1250 mg | ORAL_TABLET | Freq: Two times a day (BID) | ORAL | Status: DC
  Administered 2019-03-06 – 2019-03-10 (×8): 3.125 mg via ORAL
  Filled 2019-03-06 (×8): qty 1

## 2019-03-06 MED ORDER — SODIUM CHLORIDE 0.9 % IV SOLN
200.0000 mg | Freq: Once | INTRAVENOUS | Status: AC
  Administered 2019-03-06: 200 mg via INTRAVENOUS
  Filled 2019-03-06: qty 200

## 2019-03-06 MED ORDER — SACUBITRIL-VALSARTAN 24-26 MG PO TABS
ORAL_TABLET | ORAL | Status: DC
Start: 2019-03-05 — End: 2019-03-06

## 2019-03-06 MED ORDER — LORAZEPAM 1 MG PO TABS: 1.0000 mg | ORAL_TABLET | ORAL | Status: DC | PRN

## 2019-03-06 MED ORDER — ZINC SULFATE 220 (50 ZN) MG PO CAPS
220.0000 mg | ORAL_CAPSULE | Freq: Every day | ORAL | Status: DC
  Administered 2019-03-06 – 2019-03-10 (×5): 220 mg via ORAL
  Filled 2019-03-06 (×5): qty 1

## 2019-03-06 MED ORDER — LORATADINE 10 MG PO TABS: 10.0000 mg | ORAL_TABLET | Freq: Every day | ORAL | Status: DC | PRN

## 2019-03-06 MED ORDER — FUROSEMIDE 10 MG/ML IJ SOLN: 40.0000 mg | Freq: Every day | INTRAMUSCULAR | Status: DC

## 2019-03-06 MED ORDER — ASCORBIC ACID 500 MG PO TABS
500.0000 mg | ORAL_TABLET | Freq: Every day | ORAL | Status: DC
  Administered 2019-03-06 – 2019-03-10 (×5): 500 mg via ORAL
  Filled 2019-03-06 (×5): qty 1

## 2019-03-06 MED ORDER — OLANZAPINE 5 MG PO TBDP
2.50 | ORAL_TABLET | ORAL | Status: DC
Start: 2019-03-06 — End: 2019-03-06

## 2019-03-06 MED ORDER — LORAZEPAM 2 MG/ML IJ SOLN: 1.0000 mg | INTRAMUSCULAR | Status: DC | PRN

## 2019-03-06 MED ORDER — RIVAROXABAN 20 MG PO TABS
20.0000 mg | ORAL_TABLET | Freq: Every day | ORAL | Status: DC
  Administered 2019-03-06 – 2019-03-09 (×4): 20 mg via ORAL
  Filled 2019-03-06 (×5): qty 1

## 2019-03-06 MED ORDER — THIAMINE HCL 100 MG/ML IJ SOLN: 100.0000 mg | Freq: Every day | INTRAMUSCULAR | Status: DC

## 2019-03-06 MED ORDER — DIPHENHYDRAMINE HCL 25 MG PO CAPS
50.00 | ORAL_CAPSULE | ORAL | Status: DC
Start: ? — End: 2019-03-06

## 2019-03-06 MED ORDER — LORAZEPAM 2 MG/ML IJ SOLN: 0.0000 mg | Freq: Two times a day (BID) | INTRAMUSCULAR | Status: DC

## 2019-03-06 MED ORDER — METHYLPREDNISOLONE SODIUM SUCC 40 MG IJ SOLR
40.0000 mg | Freq: Two times a day (BID) | INTRAMUSCULAR | Status: DC
  Administered 2019-03-06 – 2019-03-07 (×2): 40 mg via INTRAVENOUS
  Filled 2019-03-06 (×2): qty 1

## 2019-03-06 MED ORDER — FLUOXETINE HCL 20 MG PO CAPS
40.0000 mg | ORAL_CAPSULE | Freq: Every day | ORAL | Status: DC
  Administered 2019-03-06 – 2019-03-10 (×5): 40 mg via ORAL
  Filled 2019-03-06 (×5): qty 2

## 2019-03-06 MED ORDER — OLANZAPINE 5 MG PO TBDP
5.00 | ORAL_TABLET | ORAL | Status: DC
Start: ? — End: 2019-03-06

## 2019-03-06 MED ORDER — NICOTINE 21 MG/24HR TD PT24
21.0000 mg | MEDICATED_PATCH | Freq: Every day | TRANSDERMAL | Status: DC
  Administered 2019-03-07 – 2019-03-10 (×4): 21 mg via TRANSDERMAL
  Filled 2019-03-06 (×5): qty 1

## 2019-03-06 MED ORDER — FUROSEMIDE 10 MG/ML IJ SOLN
60.0000 mg | Freq: Two times a day (BID) | INTRAMUSCULAR | Status: AC
  Administered 2019-03-06 – 2019-03-07 (×3): 60 mg via INTRAVENOUS
  Filled 2019-03-06 (×3): qty 6

## 2019-03-06 MED ORDER — RIVAROXABAN 20 MG PO TABS
20.00 | ORAL_TABLET | ORAL | Status: DC
Start: 2019-03-05 — End: 2019-03-06

## 2019-03-06 MED ORDER — FUROSEMIDE 10 MG/ML IJ SOLN
60.0000 mg | Freq: Once | INTRAMUSCULAR | Status: AC
  Administered 2019-03-06: 13:00:00 60 mg via INTRAVENOUS
  Filled 2019-03-06: qty 8

## 2019-03-06 MED ORDER — ACETAMINOPHEN 325 MG PO TABS: 650.0000 mg | ORAL_TABLET | Freq: Four times a day (QID) | ORAL | Status: DC | PRN

## 2019-03-06 MED ORDER — FOLIC ACID 1 MG PO TABS
1.0000 mg | ORAL_TABLET | Freq: Every day | ORAL | Status: DC
  Administered 2019-03-07: 1 mg via ORAL
  Filled 2019-03-06: qty 1

## 2019-03-06 MED ORDER — DICYCLOMINE HCL 10 MG PO CAPS
10.0000 mg | ORAL_CAPSULE | Freq: Four times a day (QID) | ORAL | Status: DC
  Administered 2019-03-06 – 2019-03-10 (×14): 10 mg via ORAL
  Filled 2019-03-06 (×20): qty 1

## 2019-03-06 MED ORDER — ONDANSETRON HCL 4 MG/2ML IJ SOLN: 4.0000 mg | Freq: Three times a day (TID) | INTRAMUSCULAR | Status: DC | PRN

## 2019-03-06 MED ORDER — TRAZODONE HCL 50 MG PO TABS
150.0000 mg | ORAL_TABLET | Freq: Every evening | ORAL | Status: DC | PRN
  Administered 2019-03-10: 150 mg via ORAL
  Filled 2019-03-06: qty 3

## 2019-03-06 MED ORDER — OXYCODONE-ACETAMINOPHEN 5-325 MG PO TABS
1.0000 | ORAL_TABLET | ORAL | Status: DC | PRN
  Administered 2019-03-06: 17:00:00 1 via ORAL
  Filled 2019-03-06: qty 1

## 2019-03-06 MED ORDER — SODIUM CHLORIDE 0.9 % IV SOLN
100.0000 mg | Freq: Every day | INTRAVENOUS | Status: AC
  Administered 2019-03-07 – 2019-03-10 (×4): 100 mg via INTRAVENOUS
  Filled 2019-03-06 (×4): qty 20

## 2019-03-06 MED ORDER — ALUM & MAG HYDROXIDE-SIMETH 200-200-20 MG/5ML PO SUSP: 30.0000 mL | ORAL | Status: DC | PRN

## 2019-03-06 MED ORDER — LORAZEPAM 2 MG/ML IJ SOLN
0.5000 mg | Freq: Four times a day (QID) | INTRAMUSCULAR | Status: DC | PRN
  Administered 2019-03-08: 04:00:00 0.5 mg via INTRAVENOUS
  Filled 2019-03-06: qty 1

## 2019-03-06 MED ORDER — NITROGLYCERIN 0.4 MG SL SUBL: 0.4000 mg | SUBLINGUAL_TABLET | SUBLINGUAL | Status: DC | PRN

## 2019-03-06 MED ORDER — OLANZAPINE 5 MG PO TBDP
5.00 | ORAL_TABLET | ORAL | Status: DC
Start: 2019-03-05 — End: 2019-03-06

## 2019-03-06 MED ORDER — ATORVASTATIN CALCIUM 20 MG PO TABS
20.0000 mg | ORAL_TABLET | Freq: Every day | ORAL | Status: DC
  Administered 2019-03-06 – 2019-03-09 (×4): 20 mg via ORAL
  Filled 2019-03-06 (×4): qty 1

## 2019-03-06 MED ORDER — POTASSIUM CHLORIDE CRYS ER 20 MEQ PO TBCR
40.0000 meq | EXTENDED_RELEASE_TABLET | Freq: Once | ORAL | Status: AC
  Administered 2019-03-06: 13:00:00 40 meq via ORAL
  Filled 2019-03-06: qty 2

## 2019-03-06 MED ORDER — SACUBITRIL-VALSARTAN 24-26 MG PO TABS
1.0000 | ORAL_TABLET | Freq: Two times a day (BID) | ORAL | Status: DC
  Administered 2019-03-06 – 2019-03-10 (×9): 1 via ORAL
  Filled 2019-03-06 (×11): qty 1

## 2019-03-06 MED ORDER — POLYETHYLENE GLYCOL 3350 17 G PO PACK
17.0000 g | PACK | Freq: Every day | ORAL | Status: DC | PRN
  Administered 2019-03-09: 18:00:00 17 g via ORAL
  Filled 2019-03-06: qty 1

## 2019-03-06 MED ORDER — FUROSEMIDE 40 MG PO TABS
40.00 | ORAL_TABLET | ORAL | Status: DC
Start: 2019-03-06 — End: 2019-03-06

## 2019-03-06 NOTE — ED Triage Notes (Signed)
Pt DC from [facility] yesterday following a positive COVID dx on the 22nd of this month. Pt started experiencing increased SOB at 0400 this morning. Pt had sats of 91% upon EMS 5arrival. EMS put on 2L  and pt had sats 100%.

## 2019-03-06 NOTE — Consult Note (Signed)
Remdesivir - Pharmacy Brief Note  A/P:  Remdesivir 200 mg IVPB once followed by 100 mg IVPB daily x 4 days.   Eleonore Chiquito, PharmD, BCPS 03/06/2019 12:45 PM

## 2019-03-06 NOTE — Progress Notes (Signed)
Pt refused labs, and telemetry monitoring at this time. Will continue to encourage. MD made aware.

## 2019-03-06 NOTE — H&P (Signed)
History and Physical    Katelyn Lane [id number] DOB: 05/10/1955 DOA: 03/06/2019  Referring MD/NP/PA:   PCP: [name], MD   Patient coming from:  The patient is coming from home.  At baseline, pt is independent for most of ADL.        Chief Complaint: SOB  HPI: Katelyn Lane is a 64 y.o. female with medical history significant of sCHF with EF 15-20%, hyperlipidemia, stroke, depression, CKD-four, polysubstance abuse including cocaine abuse, tobacco abuse, left ventricular thrombus on Xarelto, depression, stroke, positive covid PCR on 2/22, who presents with shortness breath.  Patient was recently hospitalized at [facility] due to CHF exacerbation and COVID-19 infection. Patient was treated with Lasix. Patient was not treated with remdesivir for COVID-19 infection per pt. She states that she continues to have shortness of breath, mild dry cough, no chest pain. Her oxygen desaturation to 91% on RA, which improved to 95% on 2 L nasal cannula oxygen. Patient does not have nausea, vomiting, diarrhea. Patient states that she has intermittent mild right-sided abdominal discomfort. No symptoms of UTI or unilateral weakness.  ED Course: pt was found to have positive Covid PCR, BNP 2973, troponin 65, potassium 3.3, stable renal function, blood pressure 154/104, heart rate 80, RR 29, temperature normal. Chest x-ray showed increased interstitial prominence and a bilateral atypical infiltration. Patient is admitted to telemetry bed as inpatient.  Review of Systems:   General: no fevers, chills, no body weight gain, has fatigue HEENT: no blurry vision, hearing changes or sore throat Respiratory: has dyspnea, coughing, no wheezing CV: no chest pain, no palpitations GI: no nausea, vomiting, abdominal pain, diarrhea, constipation GU: no dysuria, burning on urination, increased urinary frequency, hematuria  Ext: no leg edema Neuro: no unilateral weakness, numbness, or tingling, no vision change  or hearing loss Skin: no rash, no skin tear. MSK: No muscle spasm, no deformity, no limitation of range of movement in spin Heme: No easy bruising.  Travel history: No recent long distant travel.  Allergy:  Allergies  Allergen Reactions  . Penicillins Hives, Rash and Other (See Comments)    Did it involve swelling of the face/tongue/throat, SOB, or low BP? No Did it involve sudden or severe rash/hives, skin peeling, or any reaction on the inside of your mouth or nose? Yes Did you need to seek medical attention at a hospital or doctor's office? Yes When did it last happen? >25 years If all above answers are "NO", may proceed with cephalosporin use.   . Ace Inhibitors Nausea And Vomiting    Past Medical History:  Diagnosis Date  . CHF (congestive heart failure) ([place])   . Hyperlipidemia   . Renal disorder     Past Surgical History:  Procedure Laterality Date  . RIGHT/LEFT HEART CATH AND CORONARY ANGIOGRAPHY N/A 12/30/2018   Procedure: RIGHT/LEFT HEART CATH AND CORONARY ANGIOGRAPHY;  Surgeon: Corey Skains, MD;  Location: [place] [facility];  Service: Cardiovascular;  Laterality: N/A;    Social History:  reports that she has been smoking. She has never used smokeless tobacco. She reports current drug use. Drug: Cocaine. She reports that she does not drink alcohol.  Family History:  Family History  Problem Relation Age of Onset  . Breast cancer Neg Hx      Prior to Admission medications   Medication Sig Start Date End Date Taking? Authorizing Provider  albuterol (VENTOLIN HFA) 108 (90 Base) MCG/ACT inhaler Inhale 2 puffs into the lungs every 6 (six) hours as needed  for wheezing or shortness of breath. 01/15/19   Rudene Re, MD  alum & mag hydroxide-simeth (MAALOX/MYLANTA) 200-200-20 MG/5ML suspension Take 30 mLs by mouth every 4 (four) hours as needed for indigestion or heartburn. 02/16/19   Dhungel, Flonnie Overman, MD  apixaban (ELIQUIS) 5 MG TABS tablet Take 5 mg by  mouth 2 (two) times daily.    [provider]  atorvastatin (LIPITOR) 20 MG tablet Take 20 mg by mouth daily at 6 PM.     [provider]  carvedilol (COREG) 3.125 MG tablet Take 1 tablet (3.125 mg total) by mouth 2 (two) times daily with a meal. 02/16/19   Dhungel, Nishant, MD  dicyclomine (BENTYL) 10 MG capsule Take 1 capsule (10 mg total) by mouth 3 (three) times daily as needed for spasms. 02/22/19   Harvest Dark, MD  FLUoxetine (PROZAC) 40 MG capsule Take 40 mg by mouth daily.     [provider]  furosemide (LASIX) 40 MG tablet Take 1 tablet (40 mg total) by mouth daily. 01/21/19 03/22/19  Lavina Hamman, MD  loratadine (CLARITIN) 10 MG tablet Take 1 tablet (10 mg total) by mouth daily. Patient taking differently: Take 10 mg by mouth daily as needed for itching.  01/02/19   Ezekiel Slocumb, DO  nicotine (NICODERM CQ - DOSED IN MG/24 HOURS) 21 mg/24hr patch Place 1 patch (21 mg total) onto the skin daily. 02/17/19   Dhungel, Flonnie Overman, MD  nitroGLYCERIN (NITROSTAT) 0.4 MG SL tablet Place 1 tablet (0.4 mg total) under the tongue every 5 (five) minutes as needed for chest pain. 02/16/19   Dhungel, Nishant, MD  polyethylene glycol (MIRALAX) 17 g packet Take 17 g by mouth daily. 02/21/19   Nance Pear, MD  spironolactone (ALDACTONE) 25 MG tablet Take 0.5 tablets (12.5 mg total) by mouth daily. 01/02/19   Ezekiel Slocumb, DO  traZODone (DESYREL) 100 MG tablet Take 150 mg by mouth at bedtime as needed for sleep.  09/08/13   [provider]    Physical Exam: Vitals:   03/06/19 1300 03/06/19 1410 03/06/19 1427 03/06/19 1520  BP: (!) 126/96 (!) 145/106 (!) 122/99   Pulse: 63  77   Resp: 13 17 20    Temp:    98.1 F (36.7 C)  TempSrc:    Oral  SpO2: 94%  100%   Weight:      Height:       General: Not in acute distress HEENT:       Eyes: PERRL, EOMI, no scleral icterus.       ENT: No discharge from the ears and nose, no pharynx injection, no tonsillar  enlargement.        Neck: positive JVD, no bruit, no mass felt. Heme: No neck lymph node enlargement. Cardiac: S1/S2, RRR, No murmurs, No gallops or rubs. Respiratory:  No rales, wheezing, rhonchi or rubs. GI: Soft, nondistended, nontender, no rebound pain, no organomegaly, BS present. GU: No hematuria Ext: No pitting leg edema bilaterally. 2+DP/PT pulse bilaterally. Musculoskeletal: No joint deformities, No joint redness or warmth, no limitation of ROM in spin. Skin: No rashes.  Neuro: Alert, oriented X3, cranial nerves II-XII grossly intact, moves all extremities normally.  Psych: Patient is not psychotic, no suicidal or hemocidal ideation.  Labs on Admission: I have personally reviewed following labs and imaging studies  CBC: Recent Labs  Lab 03/02/19 0454 03/06/19 0823  WBC 4.4 5.6  NEUTROABS 2.9  --   HGB 11.2* 13.5  HCT 36.8 45.1  MCV 80.5 82.0  PLT 329 [id number]   Basic Metabolic Panel: Recent Labs  Lab 03/02/19 0454 03/06/19 0859  NA 143 146*  K 3.5 3.3*  CL 110 111  CO2 23 25  GLUCOSE 116* 111*  BUN 36* 31*  CREATININE 1.87* 1.78*  CALCIUM 8.4* 8.5*   GFR: Estimated Creatinine Clearance: 38.1 mL/min (A) (by C-G formula based on SCr of 1.78 mg/dL (H)). Liver Function Tests: Recent Labs  Lab 03/02/19 0454 03/06/19 0859  AST 146* 37  ALT 182* 66*  ALKPHOS 241* 174*  BILITOT 1.0 1.1  PROT 6.0* 6.4*  ALBUMIN 2.9* 2.8*   Recent Labs  Lab 03/02/19 0454  LIPASE 41   No results for input(s): AMMONIA in the last 168 hours. Coagulation Profile: No results for input(s): INR, PROTIME in the last 168 hours. Cardiac Enzymes: No results for input(s): CKTOTAL, CKMB, CKMBINDEX, TROPONINI in the last 168 hours. BNP (last 3 results) No results for input(s): PROBNP in the last 8760 hours. HbA1C: No results for input(s): HGBA1C in the last 72 hours. CBG: No results for input(s): GLUCAP in the last 168 hours. Lipid Profile: No results for input(s): CHOL, HDL,  LDLCALC, TRIG, CHOLHDL, LDLDIRECT in the last 72 hours. Thyroid Function Tests: No results for input(s): TSH, T4TOTAL, FREET4, T3FREE, THYROIDAB in the last 72 hours. Anemia Panel: No results for input(s): VITAMINB12, FOLATE, FERRITIN, TIBC, IRON, RETICCTPCT in the last 72 hours. Urine analysis:    Component Value Date/Time   COLORURINE YELLOW (A) 02/21/2019 2114   APPEARANCEUR CLEAR (A) 02/21/2019 2114   LABSPEC 1.010 02/21/2019 2114   PHURINE 6.0 02/21/2019 2114   GLUCOSEU NEGATIVE 02/21/2019 2114   HGBUR NEGATIVE 02/21/2019 2114   BILIRUBINUR NEGATIVE 02/21/2019 2114   [place] NEGATIVE 02/21/2019 2114   PROTEINUR 100 (A) 02/21/2019 2114   NITRITE NEGATIVE 02/21/2019 2114   LEUKOCYTESUR NEGATIVE 02/21/2019 2114   Sepsis Labs: @LABRCNTIP (procalcitonin:4,lacticidven:4) ) Recent Results (from the past 240 hour(s))  Respiratory Panel by RT PCR (Flu A&B, Covid) - Nasopharyngeal Swab     Status: Abnormal   Collection Time: 03/06/19 10:03 AM   Specimen: Nasopharyngeal Swab  Result Value Ref Range Status   SARS Coronavirus 2 by RT PCR POSITIVE (A) NEGATIVE Final    Comment: RESULT CALLED TO, READ BACK BY AND VERIFIED WITH: LORRIE LEMONS AT 1152 03/06/19.PMF (NOTE) SARS-CoV-2 target nucleic acids are DETECTED. SARS-CoV-2 RNA is generally detectable in upper respiratory specimens  during the acute phase of infection. Positive results are indicative of the presence of the identified virus, but do not rule out bacterial infection or co-infection with other pathogens not detected by the test. Clinical correlation with patient history and other diagnostic information is necessary to determine patient infection status. The expected result is Negative. Fact Sheet for Patients:  PinkCheek.be Fact Sheet for Healthcare Providers: GravelBags.it This test is not yet approved or cleared by the [place] FDA and  has been  authorized for detection and/or diagnosis of SARS-CoV-2 by FDA under an Emergency Use Authorization (EUA).  This EUA will remain in effect (meaning this test can be used) for  the duration of  the COVID-19 declaration under Section 564(b)(1) of the Act, 21 U.S.C. section 360bbb-3(b)(1), unless the authorization is terminated or revoked sooner.    Influenza A by PCR NEGATIVE NEGATIVE Final   Influenza B by PCR NEGATIVE NEGATIVE Final    Comment: (NOTE) The Xpert Xpress SARS-CoV-2/FLU/RSV assay is intended as an aid in  the diagnosis of influenza from Nasopharyngeal  swab specimens and  should not be used as a sole basis for treatment. Nasal washings and  aspirates are unacceptable for Xpert Xpress SARS-CoV-2/FLU/RSV  testing. Fact Sheet for Patients: PinkCheek.be Fact Sheet for Healthcare Providers: GravelBags.it This test is not yet approved or cleared by the [place] FDA and  has been authorized for detection and/or diagnosis of SARS-CoV-2 by  FDA under an Emergency Use Authorization (EUA). This EUA will remain  in effect (meaning this test can be used) for the duration of the  Covid-19 declaration under Section 564(b)(1) of the Act, 21  U.S.C. section 360bbb-3(b)(1), unless the authorization is  terminated or revoked. Performed at [facility], [address] [place],  [address]      Radiological Exams on Admission: DG Chest Portable 1 View  Result Date: 03/06/2019 CLINICAL DATA:  Shortness of breath. COVID positive recently discharged from [facility]. Also with history of CHF. EXAM: PORTABLE CHEST 1 VIEW COMPARISON:  02/13/2019 FINDINGS: Cardiomediastinal contours are enlarged. Central vascular structures mildly engorged. Subtle peripheral opacities are seen in the left mid chest, right mid chest and right lower lobe. No signs of effusion or dense consolidation. No acute bone finding. IMPRESSION: Subtle  peripheral opacities in the left mid chest, right mid chest and right lower lobe. Findings could be seen in atypical or viral pneumonia. Increased interstitial markings may be due to above process or background pulmonary edema. Cardiomegaly as before. Electronically Signed   By: Zetta Bills M.D.   On: 03/06/2019 08:25     EKG: Independently reviewed. Sinus rhythm, QTC 508, LAD, PVC, poor R wave progression, T wave inversion in V2-V4   Assessment/Plan Principal Problem:   Acute respiratory failure with hypoxia (HCC) Active Problems:   Elevated troponin   Depression   Stroke (HCC)   Tobacco abuse   Acute on chronic systolic CHF (congestive heart failure) (HCC)   Acute respiratory disease due to COVID-19 virus   Hypokalemia   LV (left ventricular) mural thrombus   CKD (chronic kidney disease), stage IIIa   Acute respiratory failure with hypoxia (Uncertain): Likely due to combination of COVID-19 infection and acute on chronic systolic CHF exacerbation. Pt is on Xarelto. Low suspicions for PE. -will admit to tele bed inpt -Bronchodilators -Steroids -Nasal cannula oxygen to maintain oxygen saturation above 93%  Acute respiratory disease due to COVID-19 virus: -Remdesivir per pharm -Solumedrol 40 mg bid -vitamin C, zinc.  -Bronchodilators -PRN Mucinex for cough -f/u Blood culture -D-dimer, BNP,Trop, LFT, CRP, LDH, Procalcitonin, Ferritin, fibinogen, TG, Hep B SAg, HIV ab -Daily CRP, Ferritin, D-dimer, -Will ask the patient to maintain an awake prone position for 16+ hours a day, if possible, with a minimum of 2-3 hours at a time -Will attempt to maintain euvolemia to a net negative fluid status -IF patient deteriorates, will consult PCCM and ID  Acute on chronic systolic CHF: Patient does not have leg edema, but patient has positive JVD.  Chest x-ray showed increased interstitial prominence.  Elevated BNP, clinically consistent with a CHF exacerbation. -Lasix 40 mg daily (patient  received 60 mg of Lasix in ED)  Elevated troponin: trop 65. No CP.  Possibly due to demand ischemia. -Trend troponin -Check A1c, FLP, -Repeat EKG in morning -Continue Lipitor -As needed nitroglycerin  Depression: No SI or HI -Continue home medications  Stroke (HCC) -Lipitor  Tobacco abuse -Nicotine patch  Hypokalemia: K= 3.3 on admission. - Repleted - Check Mg level  LV (left ventricular) mural thrombus: -on Xarelto  CKD-IIIa:  stable.  -f/u by [facility]     Inpatient status:  # Patient requires inpatient status due to high intensity of service, high risk for further deterioration and high frequency of surveillance required.  I certify that at the point of admission it is my clinical judgment that the patient will require inpatient hospital care spanning beyond 2 midnights from the point of admission.  . This patient has multiple chronic comorbidities including sCHF with EF 15-20%, hyperlipidemia, stroke, depression, CKD-four, polysubstance abuse including cocaine abuse, tobacco abuse, left ventricular thrombus on Xarelto, depression, stroke, positive covid PCR on 2/22 . Now patient has presenting with acute respiratory failure with hypoxia due to covid 19 infection and CHF exacerbation . The worrisome physical exam findings include positive JVD . The initial radiographic and laboratory data are worrisome because of positive covid 19 PCR, elevated troponin, elevated BNP, chest x-ray showed increased interstitial prominence and bilateral atypical infiltration. . Current medical needs: please see my assessment and plan . Predictability of an adverse outcome (risk): Patient has multiple comorbidities as listed above. Now presents with acute respiratory failure with hypoxia due to covid 19 infection and CHF exacerbation. Patient's presentation is highly complicated.  Patient is at high risk of deteriorating.  Will need to be treated in hospital for at least 2 days.            DVT ppx: on Xarelto Code Status: Full code Family Communication: not done, no family member is at bed side.     Disposition Plan:  Anticipate discharge back to previous home environment Consults called:  none Admission status: Tele bed  as inpt       Date of Service 03/06/2019    [facility]   If 7PM-7AM, please contact night-coverage [email or web address] 03/06/2019, 7:08 PM

## 2019-03-06 NOTE — ED Notes (Signed)
Provided pt w/ sandwich box and drink.

## 2019-03-06 NOTE — ED Notes (Signed)
Helped pt organize personal belongings per request. Pt denies any other needs currently.

## 2019-03-06 NOTE — ED Provider Notes (Addendum)
[facility] Emergency Department Provider Note  Time seen: 7:49 AM  I have reviewed the triage vital signs and the nursing notes.   HISTORY  Chief Complaint Shortness of Breath   HPI Katelyn Lane is a 64 y.o. female with a past medical history of CHF, hyperlipidemia, substance abuse, CVA, hypertension, presents to the emergency department for shortness of breath.  According to the patient she awoke this morning feeling short of breath, EMS is 91% room air saturation upon their arrival.  Patient states shortness of breath is worse with exertion.  Denies any chest pain.  States slight cough which has been going on for months per patient.  No fever.   Past Medical History:  Diagnosis Date  . CHF (congestive heart failure) ([place])   . Hyperlipidemia   . Renal disorder     Patient Active Problem List   Diagnosis Date Noted  . Nausea and vomiting 02/16/2019  . Abdominal pain 02/16/2019  . Rhabdomyolysis 02/16/2019  . Acute renal failure superimposed on stage 3 chronic kidney disease ([place]) 02/16/2019  . Cocaine use 02/16/2019  . Tobacco abuse 02/16/2019  . Acute on chronic systolic CHF (congestive heart failure) ([place]) 02/16/2019  . CHF exacerbation ([place]) 02/13/2019  . Respiratory failure with hypoxia ([place]) 01/31/2019  . Stroke ([place]) 01/18/2019  . Dilated cardiomyopathy ([place]) 12/31/2018  . Chest tightness 12/31/2018  . Elevated troponin 12/31/2018  . Hypertensive urgency 12/31/2018  . Dyslipidemia 12/31/2018  . Depression 12/31/2018  . HFrEF (heart failure with reduced ejection fraction) ([place]) 12/29/2018    Past Surgical History:  Procedure Laterality Date  . RIGHT/LEFT HEART CATH AND CORONARY ANGIOGRAPHY N/A 12/30/2018   Procedure: RIGHT/LEFT HEART CATH AND CORONARY ANGIOGRAPHY;  Surgeon: Corey Skains, MD;  Location: [facility];  Service: Cardiovascular;  Laterality: N/A;    Prior to Admission medications   Medication Sig Start Date End  Date Taking? Authorizing Provider  albuterol (VENTOLIN HFA) 108 (90 Base) MCG/ACT inhaler Inhale 2 puffs into the lungs every 6 (six) hours as needed for wheezing or shortness of breath. 01/15/19   Rudene Re, MD  alum & mag hydroxide-simeth (MAALOX/MYLANTA) 200-200-20 MG/5ML suspension Take 30 mLs by mouth every 4 (four) hours as needed for indigestion or heartburn. 02/16/19   Dhungel, Flonnie Overman, MD  apixaban (ELIQUIS) 5 MG TABS tablet Take 5 mg by mouth 2 (two) times daily.    [provider]  atorvastatin (LIPITOR) 20 MG tablet Take 20 mg by mouth daily at 6 PM.     [provider]  carvedilol (COREG) 3.125 MG tablet Take 1 tablet (3.125 mg total) by mouth 2 (two) times daily with a meal. 02/16/19   Dhungel, Nishant, MD  dicyclomine (BENTYL) 10 MG capsule Take 1 capsule (10 mg total) by mouth 3 (three) times daily as needed for spasms. 02/22/19   Harvest Dark, MD  FLUoxetine (PROZAC) 40 MG capsule Take 40 mg by mouth daily.     [provider]  furosemide (LASIX) 40 MG tablet Take 1 tablet (40 mg total) by mouth daily. 01/21/19 03/22/19  Lavina Hamman, MD  loratadine (CLARITIN) 10 MG tablet Take 1 tablet (10 mg total) by mouth daily. Patient taking differently: Take 10 mg by mouth daily as needed for itching.  01/02/19   Ezekiel Slocumb, DO  nicotine (NICODERM CQ - DOSED IN MG/24 HOURS) 21 mg/24hr patch Place 1 patch (21 mg total) onto the skin daily. 02/17/19   Dhungel, Flonnie Overman, MD  nitroGLYCERIN (  NITROSTAT) 0.4 MG SL tablet Place 1 tablet (0.4 mg total) under the tongue every 5 (five) minutes as needed for chest pain. 02/16/19   Dhungel, Nishant, MD  polyethylene glycol (MIRALAX) 17 g packet Take 17 g by mouth daily. 02/21/19   Nance Pear, MD  spironolactone (ALDACTONE) 25 MG tablet Take 0.5 tablets (12.5 mg total) by mouth daily. 01/02/19   Ezekiel Slocumb, DO  traZODone (DESYREL) 100 MG tablet Take 150 mg by mouth at bedtime as needed for sleep.  09/08/13    [provider]    Allergies  Allergen Reactions  . Penicillins Hives, Rash and Other (See Comments)    Did it involve swelling of the face/tongue/throat, SOB, or low BP? No Did it involve sudden or severe rash/hives, skin peeling, or any reaction on the inside of your mouth or nose? Yes Did you need to seek medical attention at a hospital or doctor's office? Yes When did it last happen? >25 years If all above answers are "NO", may proceed with cephalosporin use.   . Ace Inhibitors Nausea And Vomiting    Family History  Problem Relation Age of Onset  . Breast cancer Neg Hx     Social History Social History   Tobacco Use  . Smoking status: Current Every Day Smoker  . Smokeless tobacco: Never Used  Substance Use Topics  . Alcohol use: No    Alcohol/week: 0.0 standard drinks  . Drug use: Yes    Types: Cocaine    Comment: 4-5 days ago    Review of Systems Constitutional: Negative for fever. Cardiovascular: Negative for chest pain. Respiratory: Positive for shortness of breath.  Occasional cough. Gastrointestinal: Negative for abdominal pain, vomiting Musculoskeletal: Negative for musculoskeletal complaints Neurological: Negative for headache All other ROS negative  ____________________________________________   PHYSICAL EXAM:  Constitutional: Alert and oriented. Well appearing and in no distress. Eyes: Normal exam ENT      Head: Normocephalic and atraumatic.      Mouth/Throat: Mucous membranes are moist. Cardiovascular: Normal rate, regular rhythm. No murmur Respiratory: Normal respiratory effort without tachypnea nor retractions. Breath sounds are clear  Gastrointestinal: Soft and nontender. No distention.  Musculoskeletal: Nontender with normal range of motion in all extremities.  Neurologic:  Normal speech and language. No gross focal neurologic deficits Skin:  Skin is warm, dry and intact.  Psychiatric: Mood and affect are  normal.  ____________________________________________    EKG  EKG viewed and interpreted by myself shows a sinus rhythm at 76 bpm with a widened QRS, left axis deviation, slight QTC prolongation nonspecific ST changes.  ____________________________________________    RADIOLOGY  Patient has subtle opacities in the bilateral chest.  ____________________________________________   INITIAL IMPRESSION / ASSESSMENT AND PLAN / ED COURSE  Pertinent labs & imaging results that were available during my care of the patient were reviewed by me and considered in my medical decision making (see chart for details).   Patient presents to the emergency department for shortness of breath since this morning.  Differential would include CHF exacerbation/pulmonary edema, pneumonia, Covid, ACS.  We will check labs, chest x-ray and continue to closely monitor.  Patient has clear lung sounds at this time, no lower extremity edema noted.  Subtle opacities noted in bilateral chest.  Could be an atypical pneumonia versus asymmetric edema.  We will obtain a PCR Covid swab.  Patient remains somewhat tachypneic in the mid to upper 20s, satting in the low 90s on room air.  Given the x-ray  findings and dyspnea especially worse with exertion and elevated BNP we will dose 60 mg of IV Lasix.  During the patient's hospital stay she had an approximate 6 beat run of ventricular tachycardia as well.  We will admit the patient to the hospital service for further treatment and work-up.   In reviewing the patient's care everywhere chart she tested positive for Covid 4 days ago and was admitted to [facility] all of which the patient did not inform us of.  Patient will be maintained on isolation precautions and admitted to the hospital for shortness of breath.  Lyneisha Clifft was evaluated in Emergency Department on 03/06/2019 for the symptoms described in the history of present illness. She was evaluated in the context of the global  COVID-19 pandemic, which necessitated consideration that the patient might be at risk for infection with the SARS-CoV-2 virus that causes COVID-19. Institutional protocols and algorithms that pertain to the evaluation of patients at risk for COVID-19 are in a state of rapid change based on information released by regulatory bodies including the CDC and federal and state organizations. These policies and algorithms were followed during the patient's care in the ED.  ____________________________________________   FINAL CLINICAL IMPRESSION(S) / ED DIAGNOSES  Dyspnea COVID19    Harvest Dark, MD 03/06/19 1114    Harvest Dark, MD 03/06/19 [phone]

## 2019-03-06 NOTE — ED Notes (Signed)
Unhooked pt to go to the toilet.

## 2019-03-06 NOTE — ED Notes (Signed)
Pt brought cup of coffee and warm blanket.

## 2019-03-06 NOTE — Progress Notes (Signed)
Pt actively refusing lab draw and UA. Rachael Fee, NP, made aware.

## 2019-03-07 LAB — TROPONIN I (HIGH SENSITIVITY): Troponin I (High Sensitivity): 40 ng/L — ABNORMAL HIGH

## 2019-03-07 LAB — CBC WITH DIFFERENTIAL/PLATELET
Abs Immature Granulocytes: 0.02 10*3/uL (ref 0.00–0.07)
Basophils Absolute: 0 10*3/uL (ref 0.0–0.1)
Basophils Relative: 0 %
Eosinophils Absolute: 0 10*3/uL (ref 0.0–0.5)
Eosinophils Relative: 0 %
HCT: 40.4 % (ref 36.0–46.0)
Hemoglobin: 12.4 g/dL (ref 12.0–15.0)
Immature Granulocytes: 0 %
Lymphocytes Relative: 17 %
Lymphs Abs: 0.9 10*3/uL (ref 0.7–4.0)
MCH: 24.5 pg — ABNORMAL LOW (ref 26.0–34.0)
MCHC: 30.7 g/dL (ref 30.0–36.0)
MCV: 79.8 fL — ABNORMAL LOW (ref 80.0–100.0)
Monocytes Absolute: 0.3 10*3/uL (ref 0.1–1.0)
Monocytes Relative: 6 %
Neutro Abs: 4.1 10*3/uL (ref 1.7–7.7)
Neutrophils Relative %: 77 %
Platelets: 399 10*3/uL (ref 150–400)
RBC: 5.06 MIL/uL (ref 3.87–5.11)
RDW: 17.2 % — ABNORMAL HIGH (ref 11.5–15.5)
WBC: 5.3 10*3/uL (ref 4.0–10.5)
nRBC: 0 % (ref 0.0–0.2)

## 2019-03-07 LAB — FIBRIN DERIVATIVES D-DIMER (ARMC ONLY): Fibrin derivatives D-dimer (ARMC): 882.93 ng/mL (FEU) — ABNORMAL HIGH (ref 0.00–499.00)

## 2019-03-07 LAB — COMPREHENSIVE METABOLIC PANEL
ALT: 55 U/L — ABNORMAL HIGH (ref 0–44)
AST: 34 U/L (ref 15–41)
Albumin: 2.9 g/dL — ABNORMAL LOW (ref 3.5–5.0)
Alkaline Phosphatase: 167 U/L — ABNORMAL HIGH (ref 38–126)
Anion gap: 12 (ref 5–15)
BUN: 33 mg/dL — ABNORMAL HIGH (ref 8–23)
CO2: 21 mmol/L — ABNORMAL LOW (ref 22–32)
Calcium: 8.8 mg/dL — ABNORMAL LOW (ref 8.9–10.3)
Chloride: 109 mmol/L (ref 98–111)
Creatinine, Ser: 1.57 mg/dL — ABNORMAL HIGH (ref 0.44–1.00)
GFR calc Af Amer: 40 mL/min — ABNORMAL LOW (ref >60)
GFR calc non Af Amer: 35 mL/min — ABNORMAL LOW (ref >60)
Glucose, Bld: 169 mg/dL — ABNORMAL HIGH (ref 70–99)
Potassium: 4.4 mmol/L (ref 3.5–5.1)
Sodium: 142 mmol/L (ref 135–145)
Total Bilirubin: 0.9 mg/dL (ref 0.3–1.2)
Total Protein: 6.8 g/dL (ref 6.5–8.1)

## 2019-03-07 LAB — MAGNESIUM: Magnesium: 1.9 mg/dL (ref 1.7–2.4)

## 2019-03-07 LAB — LIPID PANEL
Cholesterol: 126 mg/dL (ref 0–200)
HDL: 32 mg/dL — ABNORMAL LOW (ref >40)
LDL Cholesterol: 50 mg/dL (ref 0–99)
Total CHOL/HDL Ratio: 3.9 RATIO
Triglycerides: 222 mg/dL — ABNORMAL HIGH (ref <150)
VLDL: 44 mg/dL — ABNORMAL HIGH (ref 0–40)

## 2019-03-07 LAB — FERRITIN: Ferritin: 86 ng/mL (ref 11–307)

## 2019-03-07 LAB — FIBRINOGEN: Fibrinogen: 528 mg/dL — ABNORMAL HIGH (ref 210–475)

## 2019-03-07 LAB — C-REACTIVE PROTEIN: CRP: 3.1 mg/dL — ABNORMAL HIGH (ref <1.0)

## 2019-03-07 LAB — TRIGLYCERIDES: Triglycerides: 232 mg/dL — ABNORMAL HIGH (ref <150)

## 2019-03-07 LAB — HEMOGLOBIN A1C
Hgb A1c MFr Bld: 6 % — ABNORMAL HIGH (ref 4.8–5.6)
Mean Plasma Glucose: 125.5 mg/dL

## 2019-03-07 LAB — PROCALCITONIN: Procalcitonin: <0.1 ng/mL

## 2019-03-07 LAB — HEPATITIS B SURFACE ANTIGEN: Hepatitis B Surface Ag: NONREACTIVE

## 2019-03-07 LAB — LACTATE DEHYDROGENASE: LDH: 346 U/L — ABNORMAL HIGH (ref 98–192)

## 2019-03-07 LAB — PHOSPHORUS: Phosphorus: 2.8 mg/dL (ref 2.5–4.6)

## 2019-03-07 MED ORDER — FUROSEMIDE 40 MG PO TABS
40.0000 mg | ORAL_TABLET | Freq: Every day | ORAL | Status: DC
  Administered 2019-03-08 – 2019-03-10 (×3): 40 mg via ORAL
  Filled 2019-03-07 (×3): qty 1

## 2019-03-07 MED ORDER — POTASSIUM CHLORIDE CRYS ER 20 MEQ PO TBCR
20.0000 meq | EXTENDED_RELEASE_TABLET | Freq: Two times a day (BID) | ORAL | Status: DC
  Administered 2019-03-07 – 2019-03-08 (×3): 20 meq via ORAL
  Filled 2019-03-07 (×3): qty 1

## 2019-03-07 MED ORDER — DEXAMETHASONE 4 MG PO TABS
6.0000 mg | ORAL_TABLET | Freq: Every day | ORAL | Status: DC
  Administered 2019-03-08 – 2019-03-09 (×2): 6 mg via ORAL
  Filled 2019-03-07 (×2): qty 2

## 2019-03-07 MED ORDER — SPIRONOLACTONE 25 MG PO TABS
12.5000 mg | ORAL_TABLET | Freq: Every day | ORAL | Status: DC
  Administered 2019-03-07 – 2019-03-10 (×4): 12.5 mg via ORAL
  Filled 2019-03-07: qty 1
  Filled 2019-03-07: qty 0.5
  Filled 2019-03-07: qty 1
  Filled 2019-03-07: qty 0.5
  Filled 2019-03-07: qty 1
  Filled 2019-03-07 (×2): qty 0.5
  Filled 2019-03-07: qty 1

## 2019-03-07 NOTE — Progress Notes (Signed)
Lab technician attempted again to draw labs. This RN provided education and rationale about labs needing to be drawn. Pt remains to actively refuse labs. Will continue to monitor.

## 2019-03-07 NOTE — Progress Notes (Signed)
Unable to obtain EKG. EKG machine charging. Will notify on comming RN/NT of need.

## 2019-03-07 NOTE — Progress Notes (Signed)
PROGRESS NOTE    Katelyn Lane  [id number] DOB: November 15, 1955 DOA: 03/06/2019 PCP: [name], MD      Brief Narrative:  Katelyn Lane is a 64 y.o. 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  Admitted to [facility] 2/22 for severe respiratory distress and confusion.  Treated with Lasix for respiratory distress, diagnosed as CHF and resolved.  Treated with Zyprexa for agitation and improved, thought to be component of withdrawal as well.  Positive COVID PCR on 2/22 but no symptoms, hypoxia and so remdesivir held.  Since discharge, had worsening symptoms, returned to ER.  CXR showed bilateral opacities.  COVID PCR still elevated.  BNP also elevated.  Started on remdesivir, steroids and IV Lasix.       Assessment & Plan:  COVID-19 pneumonitis with acute hypoxic respiratory failure Patient presented with dyspnea, hypoxia with exertion, and bilateral infiltrates on chest x-ray in the setting of the ongoing 2020-2021 COVID-19 pandemic.   In light of the timeframe from time of diagnosis, being around 7 days, as well as her infiltrates on chest x-ray despite normoxia, I feel remdesivir is prudent given her low EF. -Start remdesivir -Continue steroids, reduce to dexamethasone 6 daily     Acute on chronic systolic CHF BNP elevated.  EF <20%.  No signs of cardiogenic shock.  Actually very little peripheral edema, JVP hard to appreciate given weight, and symptoms near baseline (NYHA III per report, can walk 120' to end of driveway stopping "a few times" to rest). -Continue Furosemide 60 mg IV twice today  -Resume home Lasix tomorrow -K supplement -Strict I/Os, daily weights, telemetry  -Daily monitoring renal function -Continue Entresto, BB    History stroke Hypertension BP high normal -Continue atorvastatin -Continue carvedilol,  Entresto -Resume spironolactone  Mood disorder Avoid lab draws prior to 7AM.  Polysubstance abuse Smoking -Smoking cessation recommended  Hypokalemia -Continue K suppl  LV mural thrombus -Continue Xarelto  CKD IIIb Cr 1.7, stable relative to baseline.  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    I will start IV remdesivir discharge when she is completed 5 days of IV remdesivir, or she chooses to leave prior to that (she has a complicated psychiatric history).  The patient does drive independently, but I feel she is a poor candidate for outpatient remdesivir due to her mental illness.        MDM: The below labs and imaging reports were reviewed and summarized above.  Medication management as above.  The patient has an acute illness that poses a threat to life, limb, or bodily function.   DVT prophylaxis: N/A on xarelto Code Status: Full code Family Communication:     Consultants:     Procedures:   1/11 echo cardiogram -- E F <20%, mural thrombus  2/26 CXR -- bilateral opacities  Antimicrobials:      Culture data:   2/26 blood cultures x2-no growth to date          Subjective: Denies swelling, orthopnea, dyspnea.  She has been able to walk to the bathroom with her normal degree of dyspnea with exertion (NYHA class III).  She has no cough, fever.  Objective: Vitals:   03/07/19 0000 03/07/19 0141 03/07/19 0400  03/07/19 0847  BP: 132/90 130/90 (!) 150/107   Pulse: 77  78 91  Resp: 14  12 (!) 23  Temp:      TempSrc:      SpO2: 99%  99% 94%  Weight:      Height:        Intake/Output Summary (Last 24 hours) at 03/07/2019 1410 Last data filed at 03/07/2019 1230 Gross per 24 hour  Intake 598.67 ml  Output --  Net 598.67 ml   Filed Weights   03/06/19 0755  Weight: 97.5 kg    Examination: General appearance:   adult female, alert and in no acute distress.  Sitting on the edge of the bed, appears active, doing "like left". HEENT: Anicteric, conjunctiva pink, lids and lashes normal. No nasal deformity, discharge, epistaxis.  Lips moist, oropharynx tacky dry, no oral lesions, hearing normal.   Skin: Warm and dry.  No jaundice.  No suspicious rashes or lesions. Cardiac: RRR, nl S1-S2, no murmurs appreciated.  Capillary refill is brisk.  JVP not visible.  No LE edema.  Radial pulses 2+ and symmetric. Respiratory: Normal respiratory rate and rhythm.  CTAB without rales or wheezes. Abdomen: Abdomen soft.  No TTP or guarding. No ascites, distension, hepatosplenomegaly.   MSK: No deformities or effusions. Neuro: Awake and alert.  EOMI, moves all extremities. Speech fluent.    Psych: Sensorium intact and responding to questions, speech pressured, attention easily distracted, affect somewhat odd, thought process tangential, judgment and insight appear moderately impaired by mental illness    Data Reviewed: I have personally reviewed following labs and imaging studies:  CBC: Recent Labs  Lab 03/02/19 0454 03/06/19 0823 03/07/19 0622  WBC 4.4 5.6 5.3  NEUTROABS 2.9  --  4.1  HGB 11.2* 13.5 12.4  HCT 36.8 45.1 40.4  MCV 80.5 82.0 79.8*  PLT 329 317 [id number]   Basic Metabolic Panel: Recent Labs  Lab 03/02/19 0454 03/06/19 0859 03/07/19 0622  NA 143 146* 142  K 3.5 3.3* 4.4  CL 110 111 109  CO2 23 25 21*  GLUCOSE 116* 111* 169*  BUN 36* 31* 33*  CREATININE 1.87* 1.78* 1.57*  CALCIUM 8.4* 8.5* 8.8*  MG  --   --  1.9  PHOS  --   --  2.8   GFR: Estimated Creatinine Clearance: 43.2 mL/min (A) (by C-G formula based on SCr of 1.57 mg/dL (H)). Liver Function Tests: Recent Labs  Lab 03/02/19 0454 03/06/19 0859 03/07/19 0622  AST 146* 37 34  ALT 182* 66* 55*  ALKPHOS 241* 174* 167*  BILITOT 1.0 1.1 0.9  PROT 6.0* 6.4* 6.8  ALBUMIN 2.9* 2.8* 2.9*   Recent Labs  Lab 03/02/19 0454  LIPASE 41    No results for input(s): AMMONIA in the last 168 hours. Coagulation Profile: No results for input(s): INR, PROTIME in the last 168 hours. Cardiac Enzymes: No results for input(s): CKTOTAL, CKMB, CKMBINDEX, TROPONINI in the last 168 hours. BNP (last 3 results) No results for input(s): PROBNP in the last 8760 hours. HbA1C: Recent Labs    03/07/19 0622  HGBA1C 6.0*   CBG: No results for input(s): GLUCAP in the last 168 hours. Lipid Profile: Recent Labs    03/07/19 0622  CHOL 126  HDL 32*  LDLCALC 50  TRIG 222*  232*  CHOLHDL 3.9   Thyroid Function Tests: No results for input(s): TSH, T4TOTAL, FREET4, T3FREE, THYROIDAB in the last 72 hours. Anemia Panel: Recent Labs    03/07/19 0622  FERRITIN 86   Urine analysis:    Component Value Date/Time   COLORURINE YELLOW (A) 02/21/2019 2114   APPEARANCEUR CLEAR (A) 02/21/2019 2114   LABSPEC 1.010 02/21/2019 2114   PHURINE 6.0 02/21/2019 2114   GLUCOSEU NEGATIVE 02/21/2019 2114   HGBUR NEGATIVE 02/21/2019 2114   BILIRUBINUR NEGATIVE 02/21/2019 2114   KETONESUR NEGATIVE 02/21/2019 2114   PROTEINUR 100 (A) 02/21/2019 2114   NITRITE NEGATIVE 02/21/2019 2114   LEUKOCYTESUR NEGATIVE 02/21/2019 2114   Sepsis Labs: @LABRCNTIP (procalcitonin:4,lacticacidven:4)  ) Recent Results (from the past 240 hour(s))  Respiratory Panel by RT PCR (Flu A&B, Covid) - Nasopharyngeal Swab     Status: Abnormal   Collection Time: 03/06/19 10:03 AM   Specimen: Nasopharyngeal Swab  Result Value Ref Range Status   SARS Coronavirus 2 by RT PCR POSITIVE (A) NEGATIVE Final    Comment: RESULT CALLED TO, READ BACK BY AND VERIFIED WITH: LORRIE LEMONS AT 1152 03/06/19.PMF (NOTE) SARS-CoV-2 target nucleic acids are DETECTED. SARS-CoV-2 RNA is generally detectable in upper respiratory specimens  during the acute phase of infection. Positive results are indicative of the presence of the identified virus, but do not rule out bacterial infection or  co-infection with other pathogens not detected by the test. Clinical correlation with patient history and other diagnostic information is necessary to determine patient infection status. The expected result is Negative. Fact Sheet for Patients:  PinkCheek.be Fact Sheet for Healthcare Providers: GravelBags.it This test is not yet approved or cleared by the [place] FDA and  has been authorized for detection and/or diagnosis of SARS-CoV-2 by FDA under an Emergency Use Authorization (EUA).  This EUA will remain in effect (meaning this test can be used) for  the duration of  the COVID-19 declaration under Section 564(b)(1) of the Act, 21 U.S.C. section 360bbb-3(b)(1), unless the authorization is terminated or revoked sooner.    Influenza A by PCR NEGATIVE NEGATIVE Final   Influenza B by PCR NEGATIVE NEGATIVE Final    Comment: (NOTE) The Xpert Xpress SARS-CoV-2/FLU/RSV assay is intended as an aid in  the diagnosis of influenza from Nasopharyngeal swab specimens and  should not be used as a sole basis for treatment. Nasal washings and  aspirates are unacceptable for Xpert Xpress SARS-CoV-2/FLU/RSV  testing. Fact Sheet for Patients: PinkCheek.be Fact Sheet for Healthcare Providers: GravelBags.it This test is not yet approved or cleared by the [place] FDA and  has been authorized for detection and/or diagnosis of SARS-CoV-2 by  FDA under an Emergency Use Authorization (EUA). This EUA will remain  in effect (meaning this test can be used) for the duration of the  Covid-19 declaration under Section 564(b)(1) of the Act, 21  U.S.C. section 360bbb-3(b)(1), unless the authorization is  terminated or revoked. Performed at [facility], [address] [place], [place] [address]          Radiology Studies: DG Chest Portable 1 View  Result Date:  03/06/2019 CLINICAL DATA:  Shortness of breath. COVID positive recently discharged from [facility]. Also with history of CHF. EXAM: PORTABLE CHEST 1 VIEW COMPARISON:  02/13/2019 FINDINGS: Cardiomediastinal contours are enlarged. Central vascular structures mildly engorged. Subtle peripheral opacities are seen in the left mid chest, right mid chest and right lower lobe. No signs of effusion or dense consolidation. No acute bone finding. IMPRESSION: Subtle peripheral opacities in the left mid chest, right mid chest and right lower lobe. Findings could be seen in atypical or viral pneumonia. Increased interstitial markings may be due to above  process or background pulmonary edema. Cardiomegaly as before. Electronically Signed   By: Zetta Bills M.D.   On: 03/06/2019 08:25        Scheduled Meds: . vitamin C  500 mg Oral Daily  . atorvastatin  20 mg Oral q1800  . carvedilol  3.125 mg Oral BID WC  . [START ON 03/08/2019] dexamethasone  6 mg Oral Daily  . dextromethorphan-guaiFENesin  1 tablet Oral BID  . dicyclomine  10 mg Oral QID  . FLUoxetine  40 mg Oral Daily  . furosemide  60 mg Intravenous BID  . [START ON 03/08/2019] furosemide  40 mg Oral Daily  . ipratropium  2 puff Inhalation Q4H  . nicotine  21 mg Transdermal Daily  . potassium chloride  20 mEq Oral BID  . rivaroxaban  20 mg Oral Q supper  . sacubitril-valsartan  1 tablet Oral BID  . spironolactone  12.5 mg Oral Daily  . zinc sulfate  220 mg Oral Daily   Continuous Infusions: . remdesivir 100 mg in NS 100 mL 100 mg (03/07/19 0903)     LOS: 1 day    Time spent: 35 minutes    Edwin Dada, MD [facility] 03/07/2019, 2:10 PM     Please page though [place] or Epic secure chat:  For [employer], Adult nurse

## 2019-03-07 NOTE — Progress Notes (Addendum)
Pt had a 14 beat run of v-tach per ccmd. Currently in normal sinus rhythm with hr of 80. vss at this time, is a&o x 4, no c/o any discomfort. Pt has also refused am labs and urinalysis. Notified brenda morrison, np regarding pt condition, no new orders were given. Per [employer] , np continue to monitor. Brandi, rn also made aware of this pt condition as she is this pt's primary nurse.

## 2019-03-08 LAB — CBC WITH DIFFERENTIAL/PLATELET
Abs Immature Granulocytes: 0.05 10*3/uL (ref 0.00–0.07)
Basophils Absolute: 0 10*3/uL (ref 0.0–0.1)
Basophils Relative: 0 %
Eosinophils Absolute: 0 10*3/uL (ref 0.0–0.5)
Eosinophils Relative: 0 %
HCT: 42 % (ref 36.0–46.0)
Hemoglobin: 12.7 g/dL (ref 12.0–15.0)
Immature Granulocytes: 0 %
Lymphocytes Relative: 14 %
Lymphs Abs: 1.9 10*3/uL (ref 0.7–4.0)
MCH: 24.3 pg — ABNORMAL LOW (ref 26.0–34.0)
MCHC: 30.2 g/dL (ref 30.0–36.0)
MCV: 80.3 fL (ref 80.0–100.0)
Monocytes Absolute: 0.9 10*3/uL (ref 0.1–1.0)
Monocytes Relative: 7 %
Neutro Abs: 10.3 10*3/uL — ABNORMAL HIGH (ref 1.7–7.7)
Neutrophils Relative %: 79 %
Platelets: 373 10*3/uL (ref 150–400)
RBC: 5.23 MIL/uL — ABNORMAL HIGH (ref 3.87–5.11)
RDW: 17.2 % — ABNORMAL HIGH (ref 11.5–15.5)
Smear Review: UNDETERMINED
WBC: 13.2 10*3/uL — ABNORMAL HIGH (ref 4.0–10.5)
nRBC: 0 % (ref 0.0–0.2)

## 2019-03-08 LAB — COMPREHENSIVE METABOLIC PANEL
ALT: 45 U/L — ABNORMAL HIGH (ref 0–44)
AST: 27 U/L (ref 15–41)
Albumin: 2.9 g/dL — ABNORMAL LOW (ref 3.5–5.0)
Alkaline Phosphatase: 148 U/L — ABNORMAL HIGH (ref 38–126)
Anion gap: 11 (ref 5–15)
BUN: 43 mg/dL — ABNORMAL HIGH (ref 8–23)
CO2: 22 mmol/L (ref 22–32)
Calcium: 9.2 mg/dL (ref 8.9–10.3)
Chloride: 108 mmol/L (ref 98–111)
Creatinine, Ser: 1.7 mg/dL — ABNORMAL HIGH (ref 0.44–1.00)
GFR calc Af Amer: 37 mL/min — ABNORMAL LOW (ref >60)
GFR calc non Af Amer: 32 mL/min — ABNORMAL LOW (ref >60)
Glucose, Bld: 112 mg/dL — ABNORMAL HIGH (ref 70–99)
Potassium: 4.4 mmol/L (ref 3.5–5.1)
Sodium: 141 mmol/L (ref 135–145)
Total Bilirubin: 0.8 mg/dL (ref 0.3–1.2)
Total Protein: 6.7 g/dL (ref 6.5–8.1)

## 2019-03-08 LAB — FERRITIN: Ferritin: 100 ng/mL (ref 11–307)

## 2019-03-08 LAB — C-REACTIVE PROTEIN: CRP: 1.2 mg/dL — ABNORMAL HIGH (ref <1.0)

## 2019-03-08 LAB — FIBRIN DERIVATIVES D-DIMER (ARMC ONLY): Fibrin derivatives D-dimer (ARMC): 724.49 ng/mL (FEU) — ABNORMAL HIGH (ref 0.00–499.00)

## 2019-03-08 MED ORDER — POTASSIUM CHLORIDE CRYS ER 20 MEQ PO TBCR
20.0000 meq | EXTENDED_RELEASE_TABLET | Freq: Every day | ORAL | Status: DC
  Administered 2019-03-09 – 2019-03-10 (×2): 20 meq via ORAL
  Filled 2019-03-08 (×2): qty 1

## 2019-03-08 NOTE — Plan of Care (Signed)

## 2019-03-08 NOTE — Progress Notes (Signed)
PROGRESS NOTE    Katelyn Lane  [id number] DOB: 1955/11/16 DOA: 03/06/2019 PCP: [name], MD      Brief Narrative:  Katelyn Lane is a 64 y.o. F with sCHF EF 10-15%, mural thrombus on Xarelto, CKD IIIb baseline 1.5, HTN, polysubstance use, Bipolar disorder, and hx stroke who presented with SOB for few days since discharge from [facility].  Admitted to [facility] 2/22 for severe respiratory distress and confusion.  Treated with Lasix for respiratory distress, diagnosed as CHF and resolved.  Treated with Zyprexa for agitation and improved, thought to be component of withdrawal as well.  Positive COVID PCR on 2/22 but no symptoms, hypoxia and so remdesivir held.  Since discharge, had worsening symptoms, returned to ER.  CXR showed bilateral opacities.  COVID PCR still elevated.  BNP also elevated.  Started on remdesivir, steroids and IV Lasix.         Assessment & Plan:  COVID-19 pneumonitis with acute hypoxic respiratory failure Patient presented with dyspnea, hypoxia with exertion, and bilateral infiltrates on chest x-ray in the setting of the ongoing 2020-2021 COVID-19 pandemic.   Despite her normal SpO2 at rest, given the timeframe from time of diagnosis to presentation (7 days), as well as her infiltrates on chest x-ray despite normoxia, I feel remdesivir is prudent given her low EF. -Continue remdesivir, day 3 of 5 -Continue steroids, day 3    Acute on chronic systolic CHF BNP elevated at admission.  EF <20%.  No signs of cardiogenic shock.    Diursed for first 24 hours, then symptoms resolved to baseline.  Volume status very difficult to estimate due to habitus.  Symptoms near baseline (NYHA III per report, can walk 120' to end of driveway stopping "a few times" to rest).  -Resume home diuretic -Continue K supplement, reduce dose -Strict I/Os, daily weights, telemetry  -Daily monitoring renal function -Continue Entresto, BB    History stroke Hypertension BP  normal -Continue atorvastatin -Continue carvedilol, Entresto -Continue spironolactone  Mood disorder Avoid lab draws prior to 7AM due to patient preference  Polysubstance abuse Smoking -Smoking cessation recommended  Hypokalemia Resolved  LV mural thrombus -Continue Xarelto  CKD IIIb Cr 1.7, stable relative to baseline.  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  Even her severe heart failure, complicated psychiatric history, and unreliable follow-up and medication adherence, I feel that 5 days remdesivir and steroids prudent.  She will need close monitoring of her fluid status while she is here, likely discharge Tuesday after completing remdesivir.        MDM: The below labs and imaging reports reviewed and summarized above.  Medication management as above.    DVT prophylaxis: N/A on xarelto Code Status: Full code Family Communication:     Consultants:     Procedures:   1/11 echo cardiogram -- E F <20%, mural thrombus  2/26 CXR -- bilateral opacities  Antimicrobials:      Culture data:   2/26 blood cultures x2-no growth to date          Subjective: No swelling, no orthopnea.  She has little bit of congestion from lying under the fan overnight, but no new chest pain, cough, fever.  She is a little bit more out of breath today than yesterday.  No sputum production.       Objective: Vitals:   03/07/19 1300 03/07/19  2000 03/08/19 0035 03/08/19 0726  BP:  112/78 113/81 (!) 131/105  Pulse: 81 80 79 80  Resp:  (!) 21 17 18   Temp:  97.6 F (36.4 C) 98.3 F (36.8 C) 98.5 F (36.9 C)  TempSrc:  Axillary  Oral  SpO2:  98% 97% 99%  Weight:      Height:        Intake/Output Summary (Last 24 hours) at 03/08/2019 1427 Last data filed at 03/07/2019 2020 Gross per 24 hour  Intake 460 ml  Output --  Net 460 ml    Filed Weights   03/06/19 0755  Weight: 97.5 kg    Examination: General appearance:  adult female, alert and in no acute distress.  Sitting in bed. HEENT: Anicteric, conjunctiva pink, lids and lashes normal. No nasal deformity, discharge, epistaxis.  Lips moist, edentulous. OP moist, no oral lesions.  Hearing normal Skin: Warm and dry.  No suspicious rashes or lesions. Cardiac: RRR, no murmurs appreciated.  No pitting LE edema at all.  EJs distended but JVP not visible.    Respiratory: Normal respiratory rate and rhythm.  CTAB without rales or wheezes. Abdomen: Abdomen soft.  No TTP or guarding. No ascites, distension, hepatosplenomegaly.   MSK: No deformities or effusions of the large joints of the upper or lower extremities bilaterally. Neuro: Awake and alert. Naming is grossly intact, and the patient's recall, recent and remote, as well as general fund of knowledge seem within normal limits.  Muscle tone normal, without fasciculations.  Moves all extremities equally and with normal coordination.  Speech fluent.    Psych: Sensorium intact and responding to questions, attention normal. Affect odd.  Pressured speech.  Judgment and insight appear slightly impaired, tangential thought process .       Data Reviewed: I have personally reviewed following labs and imaging studies:  CBC: Recent Labs  Lab 03/02/19 0454 03/06/19 0823 03/07/19 0622 03/08/19 0650  WBC 4.4 5.6 5.3 13.2*  NEUTROABS 2.9  --  4.1 10.3*  HGB 11.2* 13.5 12.4 12.7  HCT 36.8 45.1 40.4 42.0  MCV 80.5 82.0 79.8* 80.3  PLT 329 317 399 [id number]   Basic Metabolic Panel: Recent Labs  Lab 03/02/19 0454 03/06/19 0859 03/07/19 0622 03/08/19 0650  NA 143 146* 142 141  K 3.5 3.3* 4.4 4.4  CL 110 111 109 108  CO2 23 25 21* 22  GLUCOSE 116* 111* 169* 112*  BUN 36* 31* 33* 43*  CREATININE 1.87* 1.78* 1.57* 1.70*  CALCIUM 8.4* 8.5* 8.8* 9.2  MG  --   --  1.9  --   PHOS  --   --  2.8  --    GFR: Estimated  Creatinine Clearance: 39.9 mL/min (A) (by C-G formula based on SCr of 1.7 mg/dL (H)). Liver Function Tests: Recent Labs  Lab 03/02/19 0454 03/06/19 0859 03/07/19 0622 03/08/19 0650  AST 146* 37 34 27  ALT 182* 66* 55* 45*  ALKPHOS 241* 174* 167* 148*  BILITOT 1.0 1.1 0.9 0.8  PROT 6.0* 6.4* 6.8 6.7  ALBUMIN 2.9* 2.8* 2.9* 2.9*   Recent Labs  Lab 03/02/19 0454  LIPASE 41   No results for input(s): AMMONIA in the last 168 hours. Coagulation Profile: No results for input(s): INR, PROTIME in the last 168 hours. Cardiac Enzymes: No results for input(s): CKTOTAL, CKMB, CKMBINDEX, TROPONINI in the last 168 hours. BNP (last 3 results) No results for input(s): PROBNP in the last 8760 hours. HbA1C: Recent Labs    03/07/19  0622  HGBA1C 6.0*   CBG: No results for input(s): GLUCAP in the last 168 hours. Lipid Profile: Recent Labs    03/07/19 0622  CHOL 126  HDL 32*  LDLCALC 50  TRIG 222*  232*  CHOLHDL 3.9   Thyroid Function Tests: No results for input(s): TSH, T4TOTAL, FREET4, T3FREE, THYROIDAB in the last 72 hours. Anemia Panel: Recent Labs    03/07/19 0622 03/08/19 0650  FERRITIN 86 100   Urine analysis:    Component Value Date/Time   COLORURINE YELLOW (A) 02/21/2019 2114   APPEARANCEUR CLEAR (A) 02/21/2019 2114   LABSPEC 1.010 02/21/2019 2114   PHURINE 6.0 02/21/2019 2114   GLUCOSEU NEGATIVE 02/21/2019 2114   HGBUR NEGATIVE 02/21/2019 2114   BILIRUBINUR NEGATIVE 02/21/2019 2114   KETONESUR NEGATIVE 02/21/2019 2114   PROTEINUR 100 (A) 02/21/2019 2114   NITRITE NEGATIVE 02/21/2019 2114   LEUKOCYTESUR NEGATIVE 02/21/2019 2114   Sepsis Labs: @LABRCNTIP (procalcitonin:4,lacticacidven:4)  ) Recent Results (from the past 240 hour(s))  Respiratory Panel by RT PCR (Flu A&B, Covid) - Nasopharyngeal Swab     Status: Abnormal   Collection Time: 03/06/19 10:03 AM   Specimen: Nasopharyngeal Swab  Result Value Ref Range Status   SARS Coronavirus 2 by RT PCR  POSITIVE (A) NEGATIVE Final    Comment: RESULT CALLED TO, READ BACK BY AND VERIFIED WITH: LORRIE LEMONS AT 1152 03/06/19.PMF (NOTE) SARS-CoV-2 target nucleic acids are DETECTED. SARS-CoV-2 RNA is generally detectable in upper respiratory specimens  during the acute phase of infection. Positive results are indicative of the presence of the identified virus, but do not rule out bacterial infection or co-infection with other pathogens not detected by the test. Clinical correlation with patient history and other diagnostic information is necessary to determine patient infection status. The expected result is Negative. Fact Sheet for Patients:  PinkCheek.be Fact Sheet for Healthcare Providers: GravelBags.it This test is not yet approved or cleared by the [place] FDA and  has been authorized for detection and/or diagnosis of SARS-CoV-2 by FDA under an Emergency Use Authorization (EUA).  This EUA will remain in effect (meaning this test can be used) for  the duration of  the COVID-19 declaration under Section 564(b)(1) of the Act, 21 U.S.C. section 360bbb-3(b)(1), unless the authorization is terminated or revoked sooner.    Influenza A by PCR NEGATIVE NEGATIVE Final   Influenza B by PCR NEGATIVE NEGATIVE Final    Comment: (NOTE) The Xpert Xpress SARS-CoV-2/FLU/RSV assay is intended as an aid in  the diagnosis of influenza from Nasopharyngeal swab specimens and  should not be used as a sole basis for treatment. Nasal washings and  aspirates are unacceptable for Xpert Xpress SARS-CoV-2/FLU/RSV  testing. Fact Sheet for Patients: PinkCheek.be Fact Sheet for Healthcare Providers: GravelBags.it This test is not yet approved or cleared by the [place] FDA and  has been authorized for detection and/or diagnosis of SARS-CoV-2 by  FDA under an Emergency Use Authorization  (EUA). This EUA will remain  in effect (meaning this test can be used) for the duration of the  Covid-19 declaration under Section 564(b)(1) of the Act, 21  U.S.C. section 360bbb-3(b)(1), unless the authorization is  terminated or revoked. Performed at [facility], [address] [place], [place] [address]   Culture, blood (Routine X 2) w Reflex to ID Panel     Status: None (Preliminary result)   Collection Time: 03/07/19  6:22 AM   Specimen: BLOOD  Result Value Ref Range Status   Specimen  Description BLOOD RIGHT HAND  Final   Special Requests   Final    BOTTLES DRAWN AEROBIC AND ANAEROBIC Blood Culture adequate volume   Culture   Final    NO GROWTH < 24 HOURS Performed at [facility], [address] [place], [place] [address]    Report Status PENDING  Incomplete  Culture, blood (Routine X 2) w Reflex to ID Panel     Status: None (Preliminary result)   Collection Time: 03/07/19  6:22 AM   Specimen: BLOOD  Result Value Ref Range Status   Specimen Description BLOOD LEFT ANTECUBITAL  Final   Special Requests   Final    BOTTLES DRAWN AEROBIC AND ANAEROBIC Blood Culture adequate volume   Culture   Final    NO GROWTH < 24 HOURS Performed at [facility], [address], [place], [place] [address]    Report Status PENDING  Incomplete         Radiology Studies: No results found.      Scheduled Meds: . vitamin C  500 mg Oral Daily  . atorvastatin  20 mg Oral q1800  . carvedilol  3.125 mg Oral BID WC  . dexamethasone  6 mg Oral Daily  . dextromethorphan-guaiFENesin  1 tablet Oral BID  . dicyclomine  10 mg Oral QID  . FLUoxetine  40 mg Oral Daily  . furosemide  40 mg Oral Daily  . ipratropium  2 puff Inhalation Q4H  . nicotine  21 mg Transdermal Daily  . potassium chloride  20 mEq Oral BID  . rivaroxaban  20 mg Oral Q supper  . sacubitril-valsartan  1 tablet Oral BID  . spironolactone  12.5 mg Oral Daily  . zinc sulfate  220 mg Oral  Daily   Continuous Infusions: . remdesivir 100 mg in NS 100 mL 100 mg (03/08/19 0951)     LOS: 2 days    Time spent: 25 minutes    Edwin Dada, MD [facility] 03/08/2019, 2:27 PM     Please page though [place] or Epic secure chat:  For [employer], Adult nurse

## 2019-03-09 ENCOUNTER — Ambulatory Visit: Payer: Medicaid Other | Admitting: Family

## 2019-03-09 DIAGNOSIS — F329 Major depressive disorder, single episode, unspecified: Secondary | ICD-10-CM

## 2019-03-09 DIAGNOSIS — J069 Acute upper respiratory infection, unspecified: Secondary | ICD-10-CM

## 2019-03-09 DIAGNOSIS — N1831 Chronic kidney disease, stage 3a: Secondary | ICD-10-CM

## 2019-03-09 LAB — CBC WITH DIFFERENTIAL/PLATELET
Abs Immature Granulocytes: 0.06 10*3/uL (ref 0.00–0.07)
Basophils Absolute: 0 10*3/uL (ref 0.0–0.1)
Basophils Relative: 0 %
Eosinophils Absolute: 0 10*3/uL (ref 0.0–0.5)
Eosinophils Relative: 0 %
HCT: 41.2 % (ref 36.0–46.0)
Hemoglobin: 12.3 g/dL (ref 12.0–15.0)
Immature Granulocytes: 1 %
Lymphocytes Relative: 9 %
Lymphs Abs: 1 10*3/uL (ref 0.7–4.0)
MCH: 23.9 pg — ABNORMAL LOW (ref 26.0–34.0)
MCHC: 29.9 g/dL — ABNORMAL LOW (ref 30.0–36.0)
MCV: 80.2 fL (ref 80.0–100.0)
Monocytes Absolute: 0.4 10*3/uL (ref 0.1–1.0)
Monocytes Relative: 4 %
Neutro Abs: 9.1 10*3/uL — ABNORMAL HIGH (ref 1.7–7.7)
Neutrophils Relative %: 86 %
Platelets: 374 10*3/uL (ref 150–400)
RBC: 5.14 MIL/uL — ABNORMAL HIGH (ref 3.87–5.11)
RDW: 17.2 % — ABNORMAL HIGH (ref 11.5–15.5)
Smear Review: NORMAL
WBC: 10.5 10*3/uL (ref 4.0–10.5)
nRBC: 0 % (ref 0.0–0.2)

## 2019-03-09 LAB — COMPREHENSIVE METABOLIC PANEL
ALT: 41 U/L (ref 0–44)
AST: 29 U/L (ref 15–41)
Albumin: 2.8 g/dL — ABNORMAL LOW (ref 3.5–5.0)
Alkaline Phosphatase: 148 U/L — ABNORMAL HIGH (ref 38–126)
Anion gap: 10 (ref 5–15)
BUN: 52 mg/dL — ABNORMAL HIGH (ref 8–23)
CO2: 19 mmol/L — ABNORMAL LOW (ref 22–32)
Calcium: 9 mg/dL (ref 8.9–10.3)
Chloride: 107 mmol/L (ref 98–111)
Creatinine, Ser: 1.65 mg/dL — ABNORMAL HIGH (ref 0.44–1.00)
GFR calc Af Amer: 38 mL/min — ABNORMAL LOW (ref >60)
GFR calc non Af Amer: 33 mL/min — ABNORMAL LOW (ref >60)
Glucose, Bld: 163 mg/dL — ABNORMAL HIGH (ref 70–99)
Potassium: 4.7 mmol/L (ref 3.5–5.1)
Sodium: 136 mmol/L (ref 135–145)
Total Bilirubin: 0.8 mg/dL (ref 0.3–1.2)
Total Protein: 6.1 g/dL — ABNORMAL LOW (ref 6.5–8.1)

## 2019-03-09 LAB — FERRITIN: Ferritin: 84 ng/mL (ref 11–307)

## 2019-03-09 LAB — FIBRIN DERIVATIVES D-DIMER (ARMC ONLY): Fibrin derivatives D-dimer (ARMC): 493.68 ng/mL (FEU) (ref 0.00–499.00)

## 2019-03-09 LAB — C-REACTIVE PROTEIN: CRP: 0.8 mg/dL (ref <1.0)

## 2019-03-09 NOTE — TOC Initial Note (Signed)
Transition of Care [facility]) - Initial/Assessment Note    Patient Details  Name: Katelyn Lane MRN: [id number] Date of Birth: July 18, 1955  Transition of [facility]) CM/SW Contact:    Shelbie Hutching, RN Phone Number: 03/09/2019, 10:08 AM  Clinical Narrative:                 Patient admitted with COVID, recently discharged from [facility] for the same.  Patient is currently not requiring any supplemental oxygen.  Patient reports that she lives in [place] in an apartment with a friend and she and that friend look out for each other.  Patient reports that she drives is current with her PCP and gets her prescriptions from [facility] in [place] off of [address].  Patient has no discharge needs at this time but she does request for [facility] to get her a couple of T shirts.  RNCM gave patient 2 T shirts provided by the [employer].    Expected Discharge Plan: Home/Self Care Barriers to Discharge: Continued Medical Work up   Patient Goals and CMS Choice        Expected Discharge Plan and Services Expected Discharge Plan: Home/Self Care   Discharge Planning Services: CM Consult   Living arrangements for the past 2 months: Apartment Expected Discharge Date: 03/10/19                                    Prior Living Arrangements/Services Living arrangements for the past 2 months: Apartment Lives with:: Friends Patient language and need for interpreter reviewed:: Yes Do you feel safe going back to the place where you live?: Yes      Need for Family Participation in Patient Care: No (Comment)     Criminal Activity/Legal Involvement Pertinent to Current Situation/Hospitalization: No - Comment as needed  Activities of Daily Living Home Assistive Devices/Equipment: None ADL Screening (condition at time of admission) Patient's cognitive ability adequate to safely complete daily activities?: Yes Is the patient deaf or have difficulty hearing?: No Does the patient have  difficulty seeing, even when wearing glasses/contacts?: No Does the patient have difficulty concentrating, remembering, or making decisions?: No Patient able to express need for assistance with ADLs?: Yes Does the patient have difficulty dressing or bathing?: No Independently performs ADLs?: Yes (appropriate for developmental age) Does the patient have difficulty walking or climbing stairs?: No Weakness of Legs: None Weakness of Arms/Hands: None  Permission Sought/Granted Permission sought to share information with : Case Manager Permission granted to share information with : Yes, Verbal Permission Granted              Emotional Assessment   Attitude/Demeanor/Rapport: Engaged Affect (typically observed): Accepting Orientation: : Oriented to Self, Oriented to Place, Oriented to  Time, Oriented to Situation Alcohol / Substance Use: Tobacco Use, Illicit Drugs Psych Involvement: No (comment)  Admission diagnosis:  Acute congestive heart failure, unspecified heart failure type ([place]) [I50.9] COVID-19 virus infection [U07.1] COVID-19 [U07.1] Patient Active Problem List   Diagnosis Date Noted  . Acute respiratory disease due to COVID-19 virus 03/06/2019  . Acute respiratory failure with hypoxia ([place]) 03/06/2019  . Hypokalemia 03/06/2019  . LV (left ventricular) mural thrombus 03/06/2019  . CKD (chronic kidney disease), stage IIIa 03/06/2019  . Nausea and vomiting 02/16/2019  . Abdominal pain 02/16/2019  . Rhabdomyolysis 02/16/2019  . Acute renal failure superimposed on stage 3 chronic kidney disease ([place]) 02/16/2019  . Cocaine  use 02/16/2019  . Tobacco abuse 02/16/2019  . Acute on chronic systolic CHF (congestive heart failure) ([place]) 02/16/2019  . CHF exacerbation ([place]) 02/13/2019  . Respiratory failure with hypoxia ([place]) 01/31/2019  . Stroke ([place]) 01/18/2019  . Dilated cardiomyopathy ([place]) 12/31/2018  . Chest tightness 12/31/2018  . Elevated troponin 12/31/2018  .  Hypertensive urgency 12/31/2018  . Dyslipidemia 12/31/2018  . Depression 12/31/2018  . HFrEF (heart failure with reduced ejection fraction) ([place]) 12/29/2018   PCP:  [name], MD Pharmacy:   [place], [place] - [address] [place] [address] Phone: [phone] Fax: [phone]  [facility] [id number], [address] [place] [address] Phone: [phone] Fax: [phone]     Social Determinants of Health (SDOH) Interventions    Readmission Risk Interventions No flowsheet data found.

## 2019-03-09 NOTE — Progress Notes (Signed)
PROGRESS NOTE    Katelyn Lane  [id number] DOB: [birth year] DOA: 03/06/2019 PCP: [name], MD      Brief Narrative:  Katelyn Lane is a 64 y.o. F with sCHF EF 10-15%, mural thrombus on Xarelto, CKD IIIb baseline 1.5, HTN, polysubstance use, Bipolar disorder, and hx stroke who presented with SOB for few days since discharge from [facility]  Admitted to [facility] 2/22 for severe respiratory distress and confusion.  Treated with Lasix for respiratory distress, diagnosed as CHF and resolved.  Treated with Zyprexa for agitation and improved, thought to be component of withdrawal as well.  Positive COVID PCR on 2/22 but no symptoms, hypoxia and so remdesivir held.  Since discharge, had worsening symptoms, returned to ER.  CXR showed bilateral opacities.  COVID PCR still elevated.  BNP also elevated.  Started on remdesivir, steroids and IV Lasix.         Assessment & Plan:  COVID-19 pneumonitis with acute hypoxic respiratory failure Patient presented with dyspnea, hypoxia with exertion, and bilateral infiltrates on chest x-ray in the setting of the ongoing 2020-2021 COVID-19 pandemic.   Despite her normal SpO2 at rest, given the timeframe from time of diagnosis to presentation (7 days), as well as her infiltrates on chest x-ray despite normoxia, I feel remdesivir is prudent given her low EF. -Continue remdesivir, day 4 of 5 -Continue steroids, day 4  Acute on chronic systolic CHF BNP elevated at admission.  EF <20%.  No signs of cardiogenic shock.   Diursed for first 24 hours, then symptoms resolved to baseline.  Volume status very difficult to estimate due to habitus.  Symptoms near baseline (NYHA III per report, can walk 120' to end of driveway stopping "a few times" to rest).  -Continue home diuretic.  Lasix 40 mg p.o. daily -Continue K supplement, reduce dose -Strict I/Os, daily weights, telemetry  -Daily monitoring renal function -Continue Entresto, BB    History  stroke Hypertension BP normal -Continue atorvastatin -Continue carvedilol, Entresto -Continue spironolactone  Mood disorder -somewhat anxious.  Will stop steroids as this could be making her anxiety worse Avoid lab draws prior to 7AM due to patient preference  Polysubstance abuse Smoking -Smoking cessation recommended  Hypokalemia Resolved  LV mural thrombus -Continue Xarelto  CKD IIIb Cr 1.65, stable relative to baseline.  Likely COPD -Continue ipratropium       Disposition: The patient was admitted with dyspnea and bilateral infiltrates on chest x-ray in the setting of COVID-19.  Even her severe heart failure, complicated psychiatric history, and unreliable follow-up and medication adherence, I feel that 5 days remdesivir and steroids prudent.  She will need close monitoring of her fluid status while she is here, likely discharge Tuesday after completing last dose of remdesivir.     MDM: The below labs and imaging reports reviewed and summarized above.  Medication management as above.    DVT prophylaxis: on xarelto Code Status: Full code Family Communication: Patient's brother Vicente Serene over phone at [phone] Discharge planning Patient will be discharged home tomorrow after last dose of remdesivir.  Brother Vicente Serene will pick her up at discharge.  Consultants:     Procedures:   1/11 echo cardiogram -- EF <20%, mural thrombus  2/26 CXR -- bilateral opacities  Antimicrobials:      Culture data:   2/26 blood cultures x2-no growth to date    Subjective: Denies any new symptoms.  Seems somewhat anxious.  Ready to go home tomorrow.     Objective: Vitals:  03/08/19 0035 03/08/19 0726 03/09/19 0115 03/09/19 0700  BP: 113/81 (!) 131/105 110/78 114/90  Pulse: 79 80 62 72  Resp: 17 18 18 17   Temp: 98.3 F (36.8 C) 98.5 F (36.9 C) 98 F (36.7 C) 97.9 F (36.6 C)  TempSrc:  Oral Oral Oral  SpO2: 97% 99% 95% 98%  Weight:      Height:         Intake/Output Summary (Last 24 hours) at 03/09/2019 1238 Last data filed at 03/09/2019 1207 Gross per 24 hour  Intake 200 ml  Output --  Net 200 ml   Filed Weights   03/06/19 0755  Weight: 97.5 kg    Examination: General appearance:  adult female, alert and in no acute distress.  Sitting in bed. HEENT: Anicteric, conjunctiva pink, lids and lashes normal. No nasal deformity, discharge, epistaxis.  Lips moist, edentulous. OP moist, no oral lesions.  Hearing normal Skin: Warm and dry.  No suspicious rashes or lesions. Cardiac: RRR, no murmurs appreciated.  No pitting LE edema at all.  EJs distended but JVP not visible.    Respiratory: Normal respiratory rate and rhythm.  CTAB without rales or wheezes. Abdomen: Abdomen soft.  No TTP or guarding. No ascites, distension, hepatosplenomegaly.   MSK: No deformities or effusions of the large joints of the upper or lower extremities bilaterally. Neuro: Awake and alert. Naming is grossly intact, and the patient's recall, recent and remote, as well as general fund of knowledge seem within normal limits.  Muscle tone normal, without fasciculations.  Moves all extremities equally and with normal coordination.  Speech fluent.    Psych: Sensorium intact and responding to questions, attention normal. Affect odd.  Pressured speech.  Judgment and insight appear slightly impaired, tangential thought process .       Data Reviewed: I have personally reviewed following labs and imaging studies:  CBC: Recent Labs  Lab 03/06/19 0823 03/07/19 0622 03/08/19 0650 03/09/19 0702  WBC 5.6 5.3 13.2* 10.5  NEUTROABS  --  4.1 10.3* 9.1*  HGB 13.5 12.4 12.7 12.3  HCT 45.1 40.4 42.0 41.2  MCV 82.0 79.8* 80.3 80.2  PLT 317 399 373 [id number]   Basic Metabolic Panel: Recent Labs  Lab 03/06/19 0859 03/07/19 0622 03/08/19 0650 03/09/19 0702  NA 146* 142 141 136  K 3.3* 4.4 4.4 4.7  CL 111 109 108 107  CO2 25 21* 22 19*  GLUCOSE 111* 169* 112* 163*  BUN 31*  33* 43* 52*  CREATININE 1.78* 1.57* 1.70* 1.65*  CALCIUM 8.5* 8.8* 9.2 9.0  MG  --  1.9  --   --   PHOS  --  2.8  --   --    GFR: Estimated Creatinine Clearance: 41.1 mL/min (A) (by C-G formula based on SCr of 1.65 mg/dL (H)). Liver Function Tests: Recent Labs  Lab 03/06/19 0859 03/07/19 0622 03/08/19 0650 03/09/19 0702  AST 37 34 27 29  ALT 66* 55* 45* 41  ALKPHOS 174* 167* 148* 148*  BILITOT 1.1 0.9 0.8 0.8  PROT 6.4* 6.8 6.7 6.1*  ALBUMIN 2.8* 2.9* 2.9* 2.8*   No results for input(s): LIPASE, AMYLASE in the last 168 hours. No results for input(s): AMMONIA in the last 168 hours. Coagulation Profile: No results for input(s): INR, PROTIME in the last 168 hours. Cardiac Enzymes: No results for input(s): CKTOTAL, CKMB, CKMBINDEX, TROPONINI in the last 168 hours. BNP (last 3 results) No results for input(s): PROBNP in the last 8760 hours. HbA1C: Recent  Labs    03/07/19 0622  HGBA1C 6.0*   CBG: No results for input(s): GLUCAP in the last 168 hours. Lipid Profile: Recent Labs    03/07/19 0622  CHOL 126  HDL 32*  LDLCALC 50  TRIG 222*  232*  CHOLHDL 3.9   Thyroid Function Tests: No results for input(s): TSH, T4TOTAL, FREET4, T3FREE, THYROIDAB in the last 72 hours. Anemia Panel: Recent Labs    03/08/19 0650 03/09/19 0702  FERRITIN 100 84   Urine analysis:    Component Value Date/Time   COLORURINE YELLOW (A) 02/21/2019 2114   APPEARANCEUR CLEAR (A) 02/21/2019 2114   LABSPEC 1.010 02/21/2019 2114   PHURINE 6.0 02/21/2019 2114   GLUCOSEU NEGATIVE 02/21/2019 2114   HGBUR NEGATIVE 02/21/2019 2114   BILIRUBINUR NEGATIVE 02/21/2019 2114   KETONESUR NEGATIVE 02/21/2019 2114   PROTEINUR 100 (A) 02/21/2019 2114   NITRITE NEGATIVE 02/21/2019 2114   LEUKOCYTESUR NEGATIVE 02/21/2019 2114   Sepsis Labs: @LABRCNTIP (procalcitonin:4,lacticacidven:4)  ) Recent Results (from the past 240 hour(s))  Respiratory Panel by RT PCR (Flu A&B, Covid) - Nasopharyngeal Swab      Status: Abnormal   Collection Time: 03/06/19 10:03 AM   Specimen: Nasopharyngeal Swab  Result Value Ref Range Status   SARS Coronavirus 2 by RT PCR POSITIVE (A) NEGATIVE Final    Comment: RESULT CALLED TO, READ BACK BY AND VERIFIED WITH: LORRIE LEMONS AT 1152 03/06/19.PMF (NOTE) SARS-CoV-2 target nucleic acids are DETECTED. SARS-CoV-2 RNA is generally detectable in upper respiratory specimens  during the acute phase of infection. Positive results are indicative of the presence of the identified virus, but do not rule out bacterial infection or co-infection with other pathogens not detected by the test. Clinical correlation with patient history and other diagnostic information is necessary to determine patient infection status. The expected result is Negative. Fact Sheet for Patients:  PinkCheek.be Fact Sheet for Healthcare Providers: GravelBags.it This test is not yet approved or cleared by the [place] FDA and  has been authorized for detection and/or diagnosis of SARS-CoV-2 by FDA under an Emergency Use Authorization (EUA).  This EUA will remain in effect (meaning this test can be used) for  the duration of  the COVID-19 declaration under Section 564(b)(1) of the Act, 21 U.S.C. section 360bbb-3(b)(1), unless the authorization is terminated or revoked sooner.    Influenza A by PCR NEGATIVE NEGATIVE Final   Influenza B by PCR NEGATIVE NEGATIVE Final    Comment: (NOTE) The Xpert Xpress SARS-CoV-2/FLU/RSV assay is intended as an aid in  the diagnosis of influenza from Nasopharyngeal swab specimens and  should not be used as a sole basis for treatment. Nasal washings and  aspirates are unacceptable for Xpert Xpress SARS-CoV-2/FLU/RSV  testing. Fact Sheet for Patients: PinkCheek.be Fact Sheet for Healthcare Providers: GravelBags.it This test is not yet approved  or cleared by the [place] FDA and  has been authorized for detection and/or diagnosis of SARS-CoV-2 by  FDA under an Emergency Use Authorization (EUA). This EUA will remain  in effect (meaning this test can be used) for the duration of the  Covid-19 declaration under Section 564(b)(1) of the Act, 21  U.S.C. section 360bbb-3(b)(1), unless the authorization is  terminated or revoked. Performed at [facility], [address] [place], [place] [address]   Culture, blood (Routine X 2) w Reflex to ID Panel     Status: None (Preliminary result)   Collection Time: 03/07/19  6:22 AM   Specimen: BLOOD  Result Value Ref  Range Status   Specimen Description BLOOD RIGHT HAND  Final   Special Requests   Final    BOTTLES DRAWN AEROBIC AND ANAEROBIC Blood Culture adequate volume   Culture   Final    NO GROWTH 2 DAYS Performed at [facility], [address] [place], [place] [address]    Report Status PENDING  Incomplete  Culture, blood (Routine X 2) w Reflex to ID Panel     Status: None (Preliminary result)   Collection Time: 03/07/19  6:22 AM   Specimen: BLOOD  Result Value Ref Range Status   Specimen Description BLOOD LEFT ANTECUBITAL  Final   Special Requests   Final    BOTTLES DRAWN AEROBIC AND ANAEROBIC Blood Culture adequate volume   Culture   Final    NO GROWTH 2 DAYS Performed at [facility], [address], [place],  [address]    Report Status PENDING  Incomplete         Radiology Studies: No results found.      Scheduled Meds: . vitamin C  500 mg Oral Daily  . atorvastatin  20 mg Oral q1800  . carvedilol  3.125 mg Oral BID WC  . dextromethorphan-guaiFENesin  1 tablet Oral BID  . dicyclomine  10 mg Oral QID  . FLUoxetine  40 mg Oral Daily  . furosemide  40 mg Oral Daily  . ipratropium  2 puff Inhalation Q4H  . nicotine  21 mg Transdermal Daily  . potassium chloride  20 mEq Oral Daily  . rivaroxaban  20 mg Oral Q supper   . sacubitril-valsartan  1 tablet Oral BID  . spironolactone  12.5 mg Oral Daily  . zinc sulfate  220 mg Oral Daily   Continuous Infusions: . remdesivir 100 mg in NS 100 mL Stopped (03/09/19 1013)     LOS: 3 days    Time spent: 25 minutes    Max Sane, MD [facility] 03/09/2019, 12:38 PM     Please page though Dent or Epic secure chat:  For [employer], Adult nurse

## 2019-03-10 DIAGNOSIS — I509 Heart failure, unspecified: Secondary | ICD-10-CM

## 2019-03-10 LAB — CBC WITH DIFFERENTIAL/PLATELET
Abs Immature Granulocytes: 0.06 10*3/uL (ref 0.00–0.07)
Basophils Absolute: 0 10*3/uL (ref 0.0–0.1)
Basophils Relative: 0 %
Eosinophils Absolute: 0 10*3/uL (ref 0.0–0.5)
Eosinophils Relative: 0 %
HCT: 40.9 % (ref 36.0–46.0)
Hemoglobin: 12.4 g/dL (ref 12.0–15.0)
Immature Granulocytes: 1 %
Lymphocytes Relative: 10 %
Lymphs Abs: 1.1 10*3/uL (ref 0.7–4.0)
MCH: 24.1 pg — ABNORMAL LOW (ref 26.0–34.0)
MCHC: 30.3 g/dL (ref 30.0–36.0)
MCV: 79.6 fL — ABNORMAL LOW (ref 80.0–100.0)
Monocytes Absolute: 0.6 10*3/uL (ref 0.1–1.0)
Monocytes Relative: 6 %
Neutro Abs: 9.4 10*3/uL — ABNORMAL HIGH (ref 1.7–7.7)
Neutrophils Relative %: 83 %
Platelets: 381 10*3/uL (ref 150–400)
RBC: 5.14 MIL/uL — ABNORMAL HIGH (ref 3.87–5.11)
RDW: 17.5 % — ABNORMAL HIGH (ref 11.5–15.5)
WBC: 11.1 10*3/uL — ABNORMAL HIGH (ref 4.0–10.5)
nRBC: 0 % (ref 0.0–0.2)

## 2019-03-10 LAB — FIBRIN DERIVATIVES D-DIMER (ARMC ONLY): Fibrin derivatives D-dimer (ARMC): 418.42 ng/mL (FEU) (ref 0.00–499.00)

## 2019-03-10 LAB — COMPREHENSIVE METABOLIC PANEL
ALT: 35 U/L (ref 0–44)
AST: 25 U/L (ref 15–41)
Albumin: 2.8 g/dL — ABNORMAL LOW (ref 3.5–5.0)
Alkaline Phosphatase: 137 U/L — ABNORMAL HIGH (ref 38–126)
Anion gap: 10 (ref 5–15)
BUN: 55 mg/dL — ABNORMAL HIGH (ref 8–23)
CO2: 20 mmol/L — ABNORMAL LOW (ref 22–32)
Calcium: 9 mg/dL (ref 8.9–10.3)
Chloride: 107 mmol/L (ref 98–111)
Creatinine, Ser: 1.73 mg/dL — ABNORMAL HIGH (ref 0.44–1.00)
GFR calc Af Amer: 36 mL/min — ABNORMAL LOW (ref >60)
GFR calc non Af Amer: 31 mL/min — ABNORMAL LOW (ref >60)
Glucose, Bld: 265 mg/dL — ABNORMAL HIGH (ref 70–99)
Potassium: 5 mmol/L (ref 3.5–5.1)
Sodium: 137 mmol/L (ref 135–145)
Total Bilirubin: 0.7 mg/dL (ref 0.3–1.2)
Total Protein: 6.1 g/dL — ABNORMAL LOW (ref 6.5–8.1)

## 2019-03-10 LAB — C-REACTIVE PROTEIN: CRP: 1.3 mg/dL — ABNORMAL HIGH (ref <1.0)

## 2019-03-10 LAB — FERRITIN: Ferritin: 92 ng/mL (ref 11–307)

## 2019-03-10 MED ORDER — POLYVINYL ALCOHOL 1.4 % OP SOLN
1.0000 [drp] | OPHTHALMIC | Status: DC | PRN
  Filled 2019-03-10: qty 15

## 2019-03-10 MED ORDER — ZINC SULFATE 220 (50 ZN) MG PO CAPS: 220.0000 mg | ORAL_CAPSULE | Freq: Every day | ORAL | 0 refills | Status: DC

## 2019-03-10 MED ORDER — ASCORBIC ACID 500 MG PO TABS: 500.0000 mg | ORAL_TABLET | Freq: Every day | ORAL | 0 refills | Status: DC

## 2019-03-10 NOTE — Progress Notes (Signed)
Discharge education complete.  Discharge instructions given and reviewed.  All questions answered.  Patient verbalized understanding.  Patient to be transported off unit via wheelchair to hospital exit to meet family member transporting her home at [place].

## 2019-03-10 NOTE — Discharge Instructions (Signed)
COVID-19 COVID-19 is a respiratory infection that is caused by a virus called severe acute respiratory syndrome coronavirus 2 (SARS-CoV-2). The disease is also known as coronavirus disease or novel coronavirus. In some people, the virus may not cause any symptoms. In others, it may cause a serious infection. The infection can get worse quickly and can lead to complications, such as:  Pneumonia, or infection of the lungs.  Acute respiratory distress syndrome or ARDS. This is a condition in which fluid build-up in the lungs prevents the lungs from filling with air and passing oxygen into the blood.  Acute respiratory failure. This is a condition in which there is not enough oxygen passing from the lungs to the body or when carbon dioxide is not passing from the lungs out of the body.  Sepsis or septic shock. This is a serious bodily reaction to an infection.  Blood clotting problems.  Secondary infections due to bacteria or fungus.  Organ failure. This is when your body's organs stop working. The virus that causes COVID-19 is contagious. This means that it can spread from person to person through droplets from coughs and sneezes (respiratory secretions). What are the causes? This illness is caused by a virus. You may catch the virus by:  Breathing in droplets from an infected person. Droplets can be spread by a person breathing, speaking, singing, coughing, or sneezing.  Touching something, like a table or a doorknob, that was exposed to the virus (contaminated) and then touching your mouth, nose, or eyes. What increases the risk? Risk for infection You are more likely to be infected with this virus if you:  Are within 6 feet (2 meters) of a person with COVID-19.  Provide care for or live with a person who is infected with COVID-19.  Spend time in crowded indoor spaces or live in shared housing. Risk for serious illness You are more likely to become seriously ill from the virus if you:   Are 50 years of age or older. The higher your age, the more you are at risk for serious illness.  Live in a nursing home or long-term care facility.  Have cancer.  Have a long-term (chronic) disease such as: ? Chronic lung disease, including chronic obstructive pulmonary disease or asthma. ? A long-term disease that lowers your body's ability to fight infection (immunocompromised). ? Heart disease, including heart failure, a condition in which the arteries that lead to the heart become narrow or blocked (coronary artery disease), a disease which makes the heart muscle thick, weak, or stiff (cardiomyopathy). ? Diabetes. ? Chronic kidney disease. ? Sickle cell disease, a condition in which red blood cells have an abnormal "sickle" shape. ? Liver disease.  Are obese. What are the signs or symptoms? Symptoms of this condition can range from mild to severe. Symptoms may appear any time from 2 to 14 days after being exposed to the virus. They include:  A fever or chills.  A cough.  Difficulty breathing.  Headaches, body aches, or muscle aches.  Runny or stuffy (congested) nose.  A sore throat.  New loss of taste or smell. Some people may also have stomach problems, such as nausea, vomiting, or diarrhea. Other people may not have any symptoms of COVID-19. How is this diagnosed? This condition may be diagnosed based on:  Your signs and symptoms, especially if: ? You live in an area with a COVID-19 outbreak. ? You recently traveled to or from an area where the virus is common. ? You   provide care for or live with a person who was diagnosed with COVID-19. ? You were exposed to a person who was diagnosed with COVID-19.  A physical exam.  Lab tests, which may include: ? Taking a sample of fluid from the back of your nose and throat (nasopharyngeal fluid), your nose, or your throat using a swab. ? A sample of mucus from your lungs (sputum). ? Blood tests.  Imaging tests, which  may include, X-rays, CT scan, or ultrasound. How is this treated? At present, there is no medicine to treat COVID-19. Medicines that treat other diseases are being used on a trial basis to see if they are effective against COVID-19. Your health care provider will talk with you about ways to treat your symptoms. For most people, the infection is mild and can be managed at home with rest, fluids, and over-the-counter medicines. Treatment for a serious infection usually takes places in a hospital intensive care unit (ICU). It may include one or more of the following treatments. These treatments are given until your symptoms improve.  Receiving fluids and medicines through an IV.  Supplemental oxygen. Extra oxygen is given through a tube in the nose, a face mask, or a hood.  Positioning you to lie on your stomach (prone position). This makes it easier for oxygen to get into the lungs.  Continuous positive airway pressure (CPAP) or bi-level positive airway pressure (BPAP) machine. This treatment uses mild air pressure to keep the airways open. A tube that is connected to a motor delivers oxygen to the body.  Ventilator. This treatment moves air into and out of the lungs by using a tube that is placed in your windpipe.  Tracheostomy. This is a procedure to create a hole in the neck so that a breathing tube can be inserted.  Extracorporeal membrane oxygenation (ECMO). This procedure gives the lungs a chance to recover by taking over the functions of the heart and lungs. It supplies oxygen to the body and removes carbon dioxide. Follow these instructions at home: Lifestyle  If you are sick, stay home except to get medical care. Your health care provider will tell you how long to stay home. Call your health care provider before you go for medical care.  Rest at home as told by your health care provider.  Do not use any products that contain nicotine or tobacco, such as cigarettes, e-cigarettes, and  chewing tobacco. If you need help quitting, ask your health care provider.  Return to your normal activities as told by your health care provider. Ask your health care provider what activities are safe for you. General instructions  Take over-the-counter and prescription medicines only as told by your health care provider.  Drink enough fluid to keep your urine pale yellow.  Keep all follow-up visits as told by your health care provider. This is important. How is this prevented?  There is no vaccine to help prevent COVID-19 infection. However, there are steps you can take to protect yourself and others from this virus. To protect yourself:   Do not travel to areas where COVID-19 is a risk. The areas where COVID-19 is reported change often. To identify high-risk areas and travel restrictions, check the CDC travel website: wwwnc.cdc.gov/travel/notices  If you live in, or must travel to, an area where COVID-19 is a risk, take precautions to avoid infection. ? Stay away from people who are sick. ? Wash your hands often with soap and water for 20 seconds. If soap and water   are not available, use an alcohol-based hand sanitizer. ? Avoid touching your mouth, face, eyes, or nose. ? Avoid going out in public, follow guidance from your state and local health authorities. ? If you must go out in public, wear a cloth face covering or face mask. Make sure your mask covers your nose and mouth. ? Avoid crowded indoor spaces. Stay at least 6 feet (2 meters) away from others. ? Disinfect objects and surfaces that are frequently touched every day. This may include:  Counters and tables.  Doorknobs and light switches.  Sinks and faucets.  Electronics, such as phones, remote controls, keyboards, computers, and tablets. To protect others: If you have symptoms of COVID-19, take steps to prevent the virus from spreading to others.  If you think you have a COVID-19 infection, contact your health care  provider right away. Tell your health care team that you think you may have a COVID-19 infection.  Stay home. Leave your house only to seek medical care. Do not use public transport.  Do not travel while you are sick.  Wash your hands often with soap and water for 20 seconds. If soap and water are not available, use alcohol-based hand sanitizer.  Stay away from other members of your household. Let healthy household members care for children and pets, if possible. If you have to care for children or pets, wash your hands often and wear a mask. If possible, stay in your own room, separate from others. Use a different bathroom.  Make sure that all people in your household wash their hands well and often.  Cough or sneeze into a tissue or your sleeve or elbow. Do not cough or sneeze into your hand or into the air.  Wear a cloth face covering or face mask. Make sure your mask covers your nose and mouth. Where to find more information  Centers for Disease Control and Prevention: www.cdc.gov/coronavirus/2019-ncov/index.html  World Health Organization: www.who.int/health-topics/coronavirus Contact a health care provider if:  You live in or have traveled to an area where COVID-19 is a risk and you have symptoms of the infection.  You have had contact with someone who has COVID-19 and you have symptoms of the infection. Get help right away if:  You have trouble breathing.  You have pain or pressure in your chest.  You have confusion.  You have bluish lips and fingernails.  You have difficulty waking from sleep.  You have symptoms that get worse. These symptoms may represent a serious problem that is an emergency. Do not wait to see if the symptoms will go away. Get medical help right away. Call your local emergency services (911 in the [place] Do not drive yourself to the hospital. Let the emergency medical personnel know if you think you have COVID-19. Summary  COVID-19 is a  respiratory infection that is caused by a virus. It is also known as coronavirus disease or novel coronavirus. It can cause serious infections, such as pneumonia, acute respiratory distress syndrome, acute respiratory failure, or sepsis.  The virus that causes COVID-19 is contagious. This means that it can spread from person to person through droplets from breathing, speaking, singing, coughing, or sneezing.  You are more likely to develop a serious illness if you are 50 years of age or older, have a weak immune system, live in a nursing home, or have chronic disease.  There is no medicine to treat COVID-19. Your health care provider will talk with you about ways to treat your symptoms.    Take steps to protect yourself and others from infection. Wash your hands often and disinfect objects and surfaces that are frequently touched every day. Stay away from people who are sick and wear a mask if you are sick. This information is not intended to replace advice given to you by your health care provider. Make sure you discuss any questions you have with your health care provider. Document Revised: 10/24/2018 Document Reviewed: 01/30/2018 Elsevier Patient Education  2020 [employer]  

## 2019-03-10 NOTE — TOC Transition Note (Signed)
Transition of Care [facility]) - CM/SW Discharge Note   Patient Details  Name: Katelyn Lane MRN: [id number] Date of Birth: 14-Apr-1955  Transition of Care [facility]) CM/SW Contact:  Shelbie Hutching, RN Phone Number: 03/10/2019, 11:40 AM   Clinical Narrative:    Patient is medically cleared for discharge home.  Patient is independent and drives and does not want home health services at discharge.  Patient does agree with Outpatient Palliative services with [facility]. Flo Shanks with [facility] given Palliative referral.  Patient has family coming to pick her up today to take her home.     Final next level of care: Home/Self Care Barriers to Discharge: Barriers Resolved   Patient Goals and CMS Choice        Discharge Placement                       Discharge Plan and Services   Discharge Planning Services: CM Consult                                 Social Determinants of Health (SDOH) Interventions     Readmission Risk Interventions Readmission Risk Prevention Plan 03/09/2019  Transportation Screening Complete  Medication Review (RN Care Manager) Referral to Pharmacy  PCP or Specialist appointment within 3-5 days of discharge Complete  Palliative Care Screening Not [place] Not Applicable  Some recent data might be hidden

## 2019-03-10 NOTE — Plan of Care (Signed)

## 2019-03-10 NOTE — Progress Notes (Signed)
New referral for [facility] community Palliative program to follow at home received from [facility]. Plan is for discharge today. Patient information given to referral. Flo Shanks BSN, RN, [place] [phone]

## 2019-03-12 ENCOUNTER — Telehealth: Payer: Self-pay | Admitting: Nurse Practitioner

## 2019-03-12 LAB — CULTURE, BLOOD (ROUTINE X 2)
Culture: NO GROWTH
Culture: NO GROWTH
Special Requests: ADEQUATE
Special Requests: ADEQUATE

## 2019-03-12 NOTE — Discharge Summary (Signed)
[facility] [place] NAME: Katelyn Lane    MR#:  [id number]  DATE OF BIRTH:  Jun 27, 1955  DATE OF ADMISSION:  03/06/2019   ADMITTING PHYSICIAN: Ivor Costa, MD  DATE OF DISCHARGE: 03/10/2019 11:58 AM  PRIMARY CARE PHYSICIAN: [name], MD   ADMISSION DIAGNOSIS:  Acute congestive heart failure, unspecified heart failure type ([place]) [I50.9] COVID-19 virus infection [U07.1] COVID-19 [U07.1] DISCHARGE DIAGNOSIS:  Principal Problem:   Acute respiratory failure with hypoxia (HCC) Active Problems:   Elevated troponin   Depression   Stroke ([place])   Acute congestive heart failure (HCC)   Tobacco abuse   Acute on chronic systolic CHF (congestive heart failure) (HCC)   COVID-19   Hypokalemia   LV (left ventricular) mural thrombus   CKD (chronic kidney disease), stage IIIa  SECONDARY DIAGNOSIS:   Past Medical History:  Diagnosis Date  . CHF (congestive heart failure) ([place])   . Hyperlipidemia   . Renal disorder    HOSPITAL COURSE:  Katelyn Lane is a 64 y.o. F with sCHF EF 10-15%, mural thrombus on Xarelto, CKD IIIb baseline 1.5, HTN, polysubstance use, Bipolar disorder, and hx stroke who presented with SOB for few days since discharge from [facility].  Admitted to [facility] 2/22 for severe respiratory distress and confusion.  Treated with Lasix for respiratory distress, diagnosed as CHF and resolved.  Treated with Zyprexa for agitation and improved, thought to be component of withdrawal as well.  Positive COVID PCR on 2/22 but no symptoms, hypoxia and so remdesivir held.  Since discharge, had worsening symptoms, returned to ER.  CXR showed bilateral opacities.  COVID PCR still elevated.  BNP also elevated.  Started on remdesivir, steroids and IV Lasix.   COVID-19 pneumonitis with acute hypoxic respiratory failure Patient presented with dyspnea, hypoxia with exertion, and bilateral infiltrates on chest x-ray in the setting of the ongoing 2020-2021 COVID-19  pandemic.   Despite her normal SpO2 at rest, given the timeframe from time of diagnosis to presentation (7 days), as well as her infiltrates on chest x-ray despite normoxia,she warrented remdesivir given her low EF. -Continue remdesivir, finished 5 days -finished 5 days steroids  Acute on chronic systolic CHF BNP elevated at admission.  EF <20%.  No signs of cardiogenic shock.   Diursed for first 24 hours, then symptoms resolved to baseline.  Volume status very difficult to estimate due to habitus.  Symptoms near baseline (NYHA III per report, can walk 120' to end of driveway stopping "a few times" to rest). -Continue home diuretic.  Lasix 40 mg p.o. daily -Continue Entresto, BB  History stroke Hypertension BP normal -Continue atorvastatin -Continue carvedilol, Entresto -Continue spironolactone  Mood disorder -somewhat anxious. steroids could be making her anxiety worse Stopped steroids at D/C  Polysubstance abuse Smoking -Smoking cessation recommended  Hypokalemia Resolved  LV mural thrombus -Continue Xarelto  CKD IIIb stable relative to baseline.   COPD Stable  Very high risk for readmission. Likely from underlying psych issues. Recommend outpt PCP and psych eval DISCHARGE CONDITIONS:  stable CONSULTS OBTAINED:   DRUG ALLERGIES:   Allergies  Allergen Reactions  . Penicillins Hives, Rash and Other (See Comments)    Did it involve swelling of the face/tongue/throat, SOB, or low BP? No Did it involve sudden or severe rash/hives, skin peeling, or any reaction on the inside of your mouth or nose? Yes Did you need to seek medical attention at a hospital or doctor's office? Yes When did it last happen? >  25 years If all above answers are "NO", may proceed with cephalosporin use.   . Ace Inhibitors Nausea And Vomiting   DISCHARGE MEDICATIONS:   Allergies as of 03/10/2019      Reactions   Penicillins Hives, Rash, Other (See Comments)   Did it involve  swelling of the face/tongue/throat, SOB, or low BP? No Did it involve sudden or severe rash/hives, skin peeling, or any reaction on the inside of your mouth or nose? Yes Did you need to seek medical attention at a hospital or doctor's office? Yes When did it last happen? >25 years If all above answers are "NO", may proceed with cephalosporin use.   Ace Inhibitors Nausea And Vomiting      Medication List    STOP taking these medications   Eliquis 5 MG Tabs tablet Generic drug: apixaban     TAKE these medications   albuterol 108 (90 Base) MCG/ACT inhaler Commonly known as: VENTOLIN HFA Inhale 2 puffs into the lungs every 6 (six) hours as needed for wheezing or shortness of breath.   alum & mag hydroxide-simeth 200-200-20 MG/5ML suspension Commonly known as: MAALOX/MYLANTA Take 30 mLs by mouth every 4 (four) hours as needed for indigestion or heartburn.   ascorbic acid 500 MG tablet Commonly known as: VITAMIN C Take 1 tablet (500 mg total) by mouth daily.   atorvastatin 20 MG tablet Commonly known as: LIPITOR Take 20 mg by mouth daily at 6 PM.   carvedilol 3.125 MG tablet Commonly known as: COREG Take 1 tablet (3.125 mg total) by mouth 2 (two) times daily with a meal.   dicyclomine 10 MG capsule Commonly known as: Bentyl Take 1 capsule (10 mg total) by mouth 3 (three) times daily as needed for spasms. What changed: when to take this   Entresto 24-26 MG Generic drug: sacubitril-valsartan Take 1 tablet by mouth 2 (two) times daily.   FLUoxetine 40 MG capsule Commonly known as: PROZAC Take 40 mg by mouth daily.   furosemide 40 MG tablet Commonly known as: Lasix Take 1 tablet (40 mg total) by mouth daily.   loratadine 10 MG tablet Commonly known as: CLARITIN Take 1 tablet (10 mg total) by mouth daily. What changed:   when to take this  reasons to take this   nicotine 21 mg/24hr patch Commonly known as: NICODERM CQ - dosed in mg/24 hours Place 1 patch (21  mg total) onto the skin daily.   nitroGLYCERIN 0.4 MG SL tablet Commonly known as: NITROSTAT Place 1 tablet (0.4 mg total) under the tongue every 5 (five) minutes as needed for chest pain.   polyethylene glycol 17 g packet Commonly known as: MiraLax Take 17 g by mouth daily.   rivaroxaban 20 MG Tabs tablet Commonly known as: XARELTO Take 20 mg by mouth daily with supper.   spironolactone 25 MG tablet Commonly known as: ALDACTONE Take 0.5 tablets (12.5 mg total) by mouth daily.   traZODone 100 MG tablet Commonly known as: DESYREL Take 150 mg by mouth at bedtime as needed for sleep.   zinc sulfate 220 (50 Zn) MG capsule Take 1 capsule (220 mg total) by mouth daily.      DISCHARGE INSTRUCTIONS:   DIET:  Cardiac diet DISCHARGE CONDITION:  Stable ACTIVITY:  Activity as tolerated OXYGEN:  Home Oxygen: No.  Oxygen Delivery: room air DISCHARGE LOCATION:  home with HHPT, RN, Palliative care to follow  If you experience worsening of your admission symptoms, develop shortness of breath, life threatening emergency,  suicidal or homicidal thoughts you must seek medical attention immediately by calling 911 or calling your MD immediately  if symptoms less severe.  You Must read complete instructions/literature along with all the possible adverse reactions/side effects for all the Medicines you take and that have been prescribed to you. Take any new Medicines after you have completely understood and accpet all the possible adverse reactions/side effects.   Please note  You were cared for by a hospitalist during your hospital stay. If you have any questions about your discharge medications or the care you received while you were in the hospital after you are discharged, you can call the unit and asked to speak with the hospitalist on call if the hospitalist that took care of you is not available. Once you are discharged, your primary care physician will handle any further medical issues.  Please note that NO REFILLS for any discharge medications will be authorized once you are discharged, as it is imperative that you return to your primary care physician (or establish a relationship with a primary care physician if you do not have one) for your aftercare needs so that they can reassess your need for medications and monitor your lab values.    On the day of Discharge:  VITAL SIGNS:  Blood pressure 119/80, pulse 69, temperature 98 F (36.7 C), temperature source Oral, resp. rate 17, height 5\' 6"  (1.676 m), weight 97.5 kg, SpO2 99 %. PHYSICAL EXAMINATION:  GENERAL:  64 y.o.-year-old patient lying in the bed with no acute distress.  EYES: Pupils equal, round, reactive to light and accommodation. No scleral icterus. Extraocular muscles intact.  HEENT: Head atraumatic, normocephalic. Oropharynx and nasopharynx clear.  NECK:  Supple, no jugular venous distention. No thyroid enlargement, no tenderness.  LUNGS: Normal breath sounds bilaterally, no wheezing, rales,rhonchi or crepitation. No use of accessory muscles of respiration.  CARDIOVASCULAR: S1, S2 normal. No murmurs, rubs, or gallops.  ABDOMEN: Soft, non-tender, non-distended. Bowel sounds present. No organomegaly or mass.  EXTREMITIES: No pedal edema, cyanosis, or clubbing.  NEUROLOGIC: Cranial nerves II through XII are intact. Muscle strength 5/5 in all extremities. Sensation intact. Gait not checked.  PSYCHIATRIC: The patient is alert and oriented x 3.  SKIN: No obvious rash, lesion, or ulcer.  DATA REVIEW:   CBC Recent Labs  Lab 03/10/19 0846  WBC 11.1*  HGB 12.4  HCT 40.9  PLT 381    Chemistries  Recent Labs  Lab 03/07/19 0622 03/08/19 0650 03/10/19 0846  NA 142   < > 137  K 4.4   < > 5.0  CL 109   < > 107  CO2 21*   < > 20*  GLUCOSE 169*   < > 265*  BUN 33*   < > 55*  CREATININE 1.57*   < > 1.73*  CALCIUM 8.8*   < > 9.0  MG 1.9  --   --   AST 34   < > 25  ALT 55*   < > 35  ALKPHOS 167*   < > 137*    BILITOT 0.9   < > 0.7   < > = values in this interval not displayed.     Outpatient follow-up Follow-up Information    [facility] On 03/17/2019.   Specialty: Cardiology Why: at 9:30am. Enter through the [facility] entrance Contact information: [address] [place] [phone]       Revelo, Elyse Jarvis, MD.  Go on 03/18/2019.   Specialty: Family Medicine Why: at 11:00 a.m. - video appointment  Contact information: [address] [place] [address] [phone]        Dinah Beers, PA. Schedule an appointment as soon as possible for a visit in 1 week.   Specialty: Gastroenterology Contact information: [address] [facility] [id number] [facility] [address] [place] [address] [phone]        Yolonda Kida, MD. Go on 04/13/2019.   Specialties: Cardiology, Internal Medicine Why: at 10:45 a.m. Contact information: [place] [address] [phone]            Management plans discussed with the patient, family and they are in agreement.  CODE STATUS: Prior   TOTAL TIME TAKING CARE OF THIS PATIENT: 45 minutes.    Max Sane M.D on 03/12/2019 at 11:41 AM  [facility]   CC: Primary care physician; Alene Mires Elyse Jarvis, MD   Note: This dictation was prepared with Dragon dictation along with smaller phrase technology. Any transcriptional errors that result from this process are unintentional.

## 2019-03-12 NOTE — Telephone Encounter (Signed)
Called and spoke with patient and while trying to explain why I was calling, the patient said she couldn't hear me, there was also a lot of background noise as well and I kept speaking louder and she ended up hanging up.  I waited and couple of minutes and called back and still the same thing, and she hung the phone up again.  I then called patient's brother Katelyn Lane and was able to speak with him briefly, he requested that I call him back later because he was making a bus run.  Will call back later.  Called brother back at 4:35 and he didn't answer, left message with reason for call and left my name and contact number, requesting a call back.

## 2019-03-13 ENCOUNTER — Telehealth: Payer: Self-pay | Admitting: Nurse Practitioner

## 2019-03-13 NOTE — Telephone Encounter (Signed)
Returned call to patient's brother, Vicente Serene and explained to him where I was calling from and to let him know that I was unable to talk with patient on the phone yesterday due to a bad connection.  He said that he was aware of this because he has the same problem.  Brother took my name and number is going to speak with the patient over the weekend and ask her to call me back next week,  preferably on a different phone.

## 2019-03-18 ENCOUNTER — Emergency Department: Payer: Medicaid Other

## 2019-03-18 ENCOUNTER — Emergency Department
Admission: EM | Admit: 2019-03-18 | Discharge: 2019-03-18 | Disposition: A | Discharge status: Home / Self Care | Payer: Medicaid Other | Attending: Emergency Medicine | Admitting: Emergency Medicine

## 2019-03-18 ENCOUNTER — Other Ambulatory Visit: Payer: Self-pay

## 2019-03-18 ENCOUNTER — Ambulatory Visit: Payer: Medicaid Other | Admitting: Family

## 2019-03-18 ENCOUNTER — Telehealth: Payer: Self-pay | Admitting: Family

## 2019-03-18 DIAGNOSIS — F17200 Nicotine dependence, unspecified, uncomplicated: Secondary | ICD-10-CM | POA: Insufficient documentation

## 2019-03-18 DIAGNOSIS — R112 Nausea with vomiting, unspecified: Secondary | ICD-10-CM | POA: Diagnosis not present

## 2019-03-18 DIAGNOSIS — R197 Diarrhea, unspecified: Secondary | ICD-10-CM | POA: Diagnosis not present

## 2019-03-18 DIAGNOSIS — Z79899 Other long term (current) drug therapy: Secondary | ICD-10-CM | POA: Insufficient documentation

## 2019-03-18 DIAGNOSIS — Z8673 Personal history of transient ischemic attack (TIA), and cerebral infarction without residual deficits: Secondary | ICD-10-CM | POA: Insufficient documentation

## 2019-03-18 DIAGNOSIS — F1410 Cocaine abuse, uncomplicated: Secondary | ICD-10-CM | POA: Diagnosis not present

## 2019-03-18 DIAGNOSIS — R109 Unspecified abdominal pain: Secondary | ICD-10-CM | POA: Insufficient documentation

## 2019-03-18 DIAGNOSIS — U071 COVID-19: Secondary | ICD-10-CM | POA: Insufficient documentation

## 2019-03-18 LAB — CBC
HCT: 39.3 % (ref 36.0–46.0)
Hemoglobin: 11.8 g/dL — ABNORMAL LOW (ref 12.0–15.0)
MCH: 24.7 pg — ABNORMAL LOW (ref 26.0–34.0)
MCHC: 30 g/dL (ref 30.0–36.0)
MCV: 82.2 fL (ref 80.0–100.0)
Platelets: 354 10*3/uL (ref 150–400)
RBC: 4.78 MIL/uL (ref 3.87–5.11)
RDW: 19.9 % — ABNORMAL HIGH (ref 11.5–15.5)
WBC: 5.2 10*3/uL (ref 4.0–10.5)
nRBC: 0 % (ref 0.0–0.2)

## 2019-03-18 LAB — COMPREHENSIVE METABOLIC PANEL
ALT: 30 U/L (ref 0–44)
AST: 33 U/L (ref 15–41)
Albumin: 3.2 g/dL — ABNORMAL LOW (ref 3.5–5.0)
Alkaline Phosphatase: 106 U/L (ref 38–126)
Anion gap: 6 (ref 5–15)
BUN: 30 mg/dL — ABNORMAL HIGH (ref 8–23)
CO2: 24 mmol/L (ref 22–32)
Calcium: 8.9 mg/dL (ref 8.9–10.3)
Chloride: 112 mmol/L — ABNORMAL HIGH (ref 98–111)
Creatinine, Ser: 1.94 mg/dL — ABNORMAL HIGH (ref 0.44–1.00)
GFR calc Af Amer: 31 mL/min — ABNORMAL LOW (ref >60)
GFR calc non Af Amer: 27 mL/min — ABNORMAL LOW (ref >60)
Glucose, Bld: 121 mg/dL — ABNORMAL HIGH (ref 70–99)
Potassium: 4.3 mmol/L (ref 3.5–5.1)
Sodium: 142 mmol/L (ref 135–145)
Total Bilirubin: 0.7 mg/dL (ref 0.3–1.2)
Total Protein: 6.5 g/dL (ref 6.5–8.1)

## 2019-03-18 LAB — LIPASE, BLOOD: Lipase: 44 U/L (ref 11–51)

## 2019-03-18 LAB — TROPONIN I (HIGH SENSITIVITY)
Troponin I (High Sensitivity): 100 ng/L (ref <18)
Troponin I (High Sensitivity): 87 ng/L — ABNORMAL HIGH (ref <18)

## 2019-03-18 MED ORDER — ONDANSETRON 4 MG PO TBDP
4.0000 mg | ORAL_TABLET | Freq: Once | ORAL | Status: AC
  Administered 2019-03-18: 4 mg via ORAL
  Filled 2019-03-18: qty 1

## 2019-03-18 MED ORDER — LOPERAMIDE HCL 2 MG PO CAPS
4.0000 mg | ORAL_CAPSULE | Freq: Once | ORAL | Status: AC
  Administered 2019-03-18: 4 mg via ORAL
  Filled 2019-03-18: qty 2

## 2019-03-18 NOTE — Telephone Encounter (Signed)
Patient did not show for her [facility] appointment on 03/18/19. Will attempt to reschedule.

## 2019-03-18 NOTE — ED Notes (Signed)
Pt ambulatory in exam room with no difficulty or distress noted; pt placed on card monitor; pt reports mid abd pain since yesterday accomp by N/V/D; pt denies any other c/o at present

## 2019-03-18 NOTE — ED Provider Notes (Signed)
[facility] Emergency Department Provider Note  ____________________________________________   First MD Initiated Contact with Patient 03/18/19 [phone]     (approximate)  I have reviewed the triage vital signs and the nursing notes.   HISTORY  Chief Complaint Shortness of Breath, Abdominal Pain, Diarrhea, and Emesis    HPI Katelyn Lane is a 64 y.o. female with below list of previous medical conditions including testing positive for Covid 19 on 03/06/2019 returns to the emergency department secondary to nausea vomiting and diarrhea x2 days as well as persistent cough.  Patient does admit to abdominal cramping.  Patient denies any fever afebrile on presentation.        Past Medical History:  Diagnosis Date  . CHF (congestive heart failure) ([place])   . Hyperlipidemia   . Renal disorder     Patient Active Problem List   Diagnosis Date Noted  . COVID-19 03/06/2019  . Acute respiratory failure with hypoxia ([place]) 03/06/2019  . Hypokalemia 03/06/2019  . LV (left ventricular) mural thrombus 03/06/2019  . CKD (chronic kidney disease), stage IIIa 03/06/2019  . Nausea and vomiting 02/16/2019  . Abdominal pain 02/16/2019  . Rhabdomyolysis 02/16/2019  . Acute renal failure superimposed on stage 3 chronic kidney disease ([place]) 02/16/2019  . Cocaine use 02/16/2019  . Tobacco abuse 02/16/2019  . Acute on chronic systolic CHF (congestive heart failure) ([place]) 02/16/2019  . Acute congestive heart failure ([place]) 02/13/2019  . Respiratory failure with hypoxia ([place]) 01/31/2019  . Stroke ([place]) 01/18/2019  . Dilated cardiomyopathy ([place]) 12/31/2018  . Chest tightness 12/31/2018  . Elevated troponin 12/31/2018  . Hypertensive urgency 12/31/2018  . Dyslipidemia 12/31/2018  . Depression 12/31/2018  . HFrEF (heart failure with reduced ejection fraction) ([place]) 12/29/2018    Past Surgical History:  Procedure Laterality Date  . RIGHT/LEFT HEART CATH AND CORONARY  ANGIOGRAPHY N/A 12/30/2018   Procedure: RIGHT/LEFT HEART CATH AND CORONARY ANGIOGRAPHY;  Surgeon: Corey Skains, MD;  Location: [facility];  Service: Cardiovascular;  Laterality: N/A;    Prior to Admission medications   Medication Sig Start Date End Date Taking? Authorizing Provider  albuterol (VENTOLIN HFA) 108 (90 Base) MCG/ACT inhaler Inhale 2 puffs into the lungs every 6 (six) hours as needed for wheezing or shortness of breath. 01/15/19   Rudene Re, MD  alum & mag hydroxide-simeth (MAALOX/MYLANTA) 200-200-20 MG/5ML suspension Take 30 mLs by mouth every 4 (four) hours as needed for indigestion or heartburn. 02/16/19   Dhungel, Flonnie Overman, MD  ascorbic acid (VITAMIN C) 500 MG tablet Take 1 tablet (500 mg total) by mouth daily. 03/10/19   Max Sane, MD  atorvastatin (LIPITOR) 20 MG tablet Take 20 mg by mouth daily at 6 PM.     [provider]  carvedilol (COREG) 3.125 MG tablet Take 1 tablet (3.125 mg total) by mouth 2 (two) times daily with a meal. 02/16/19   Dhungel, Nishant, MD  dicyclomine (BENTYL) 10 MG capsule Take 1 capsule (10 mg total) by mouth 3 (three) times daily as needed for spasms. Patient taking differently: Take 10 mg by mouth 4 (four) times daily.  02/22/19   Harvest Dark, MD  FLUoxetine (PROZAC) 40 MG capsule Take 40 mg by mouth daily.     [provider]  furosemide (LASIX) 40 MG tablet Take 1 tablet (40 mg total) by mouth daily. 01/21/19 03/22/19  Lavina Hamman, MD  loratadine (CLARITIN) 10 MG tablet Take 1 tablet (10 mg total) by mouth daily. Patient taking differently:  Take 10 mg by mouth daily as needed for itching.  01/02/19   Ezekiel Slocumb, DO  nicotine (NICODERM CQ - DOSED IN MG/24 HOURS) 21 mg/24hr patch Place 1 patch (21 mg total) onto the skin daily. 02/17/19   Dhungel, Flonnie Overman, MD  nitroGLYCERIN (NITROSTAT) 0.4 MG SL tablet Place 1 tablet (0.4 mg total) under the tongue every 5 (five) minutes as needed for chest pain. 02/16/19    Dhungel, Nishant, MD  polyethylene glycol (MIRALAX) 17 g packet Take 17 g by mouth daily. 02/21/19   Nance Pear, MD  rivaroxaban (XARELTO) 20 MG TABS tablet Take 20 mg by mouth daily with supper.    [provider]  sacubitril-valsartan (ENTRESTO) 24-26 MG Take 1 tablet by mouth 2 (two) times daily.    [provider]  spironolactone (ALDACTONE) 25 MG tablet Take 0.5 tablets (12.5 mg total) by mouth daily. 01/02/19   Ezekiel Slocumb, DO  traZODone (DESYREL) 100 MG tablet Take 150 mg by mouth at bedtime as needed for sleep.  09/08/13   [provider]  zinc sulfate 220 (50 Zn) MG capsule Take 1 capsule (220 mg total) by mouth daily. 03/10/19   Max Sane, MD    Allergies Penicillins and Ace inhibitors  Family History  Problem Relation Age of Onset  . Breast cancer Neg Hx     Social History Social History   Tobacco Use  . Smoking status: Current Every Day Smoker  . Smokeless tobacco: Never Used  Substance Use Topics  . Alcohol use: No    Alcohol/week: 0.0 standard drinks  . Drug use: Yes    Types: Cocaine    Comment: 4-5 days ago    Review of Systems Constitutional: No fever/chills Eyes: No visual changes. ENT: No sore throat. Cardiovascular: Denies chest pain. Respiratory: Denies shortness of breath. Gastrointestinal: Positive for abdominal cramping nausea vomiting and diarrhea Genitourinary: Negative for dysuria. Musculoskeletal: Negative for neck pain.  Negative for back pain. Integumentary: Negative for rash. Neurological: Negative for headaches, focal weakness or numbness.  ____________________________________________   PHYSICAL EXAM:  VITAL SIGNS: ED Triage Vitals  Enc Vitals Group     BP 03/18/19 0315 134/82     Pulse Rate 03/18/19 0315 79     Resp 03/18/19 0315 20     Temp 03/18/19 0315 98 F (36.7 C)     Temp Source 03/18/19 0315 Oral     SpO2 03/18/19 0315 100 %     Weight 03/18/19 0317 97.5 kg (215 lb)     Height  03/18/19 0317 1.676 m (5\' 6" )     Head Circumference --      Peak Flow --      Pain Score 03/18/19 0316 8     Pain Loc --      Pain Edu? --      Excl. in [place]? --     Constitutional: Alert and oriented.  Eyes: Conjunctivae are normal.  Mouth/Throat: Patient is wearing a mask. Neck: No stridor.  No meningeal signs.   Cardiovascular: Normal rate, regular rhythm. Good peripheral circulation. Grossly normal heart sounds. Respiratory: Normal respiratory effort.  No retractions. Gastrointestinal: Soft and nontender. No distention.   Musculoskeletal: No lower extremity tenderness nor edema. No gross deformities of extremities. Neurologic:  Normal speech and language. No gross focal neurologic deficits are appreciated.  Skin:  Skin is warm, dry and intact. Psychiatric: Mood and affect are normal. Speech and behavior are normal.  ____________________________________________   LABS (all labs  ordered are listed, but only abnormal results are displayed)  Labs Reviewed  CBC - Abnormal; Notable for the following components:      Result Value   Hemoglobin 11.8 (*)    MCH 24.7 (*)    RDW 19.9 (*)    All other components within normal limits  LIPASE, BLOOD  COMPREHENSIVE METABOLIC PANEL  TROPONIN I (HIGH SENSITIVITY)  TROPONIN I (HIGH SENSITIVITY)   ____________________________________________  EKG  ED ECG REPORT I, [place] N Imya Mance, the attending physician, personally viewed and interpreted this ECG.   Date: 03/18/2019  EKG Time: 3:23 AM  Rate: 76  Rhythm: Normal sinus rhythm  Axis: Left axis deviation  Intervals: Normal  ST&T Change: None _____________________________________  RADIOLOGY I, [place] N Kesean Serviss, personally viewed and evaluated these images (plain radiographs) as part of my medical decision making, as well as reviewing the written report by the radiologist.  ED MD interpretation: Subtle improved bilateral airspace opacities cardiomegaly with vascular congestion  noted on chest x-ray  Official radiology report(s): DG Chest 2 View  Result Date: 03/18/2019 CLINICAL DATA:  Shortness of breath EXAM: CHEST - 2 VIEW COMPARISON:  03/06/2019 FINDINGS: The heart size remains enlarged. Chronic bronchitic changes are again noted bilaterally. There is no large focal infiltrate. No pneumothorax. Subtle peripheral airspace opacities are again noted but are improved from prior study. There may be some vascular congestion without overt pulmonary edema. IMPRESSION: 1. Subtle but improved bilateral airspace opacities. 2. Cardiomegaly with vascular congestion. Electronically Signed   By: Constance Holster M.D.   On: 03/18/2019 03:36    ____________________________________________   PROCEDURES    Procedures   ____________________________________________   INITIAL IMPRESSION / MDM / [place] / ED COURSE  As part of my medical decision making, I reviewed the following data within the electronic MEDICAL RECORD NUMBER   64 year old female presented with above-stated history and physical exam.  Laboratory data notable for an elevated troponin of 100 and subsequently 87.  Patient has a chronically elevated troponin.  Patient has no chest pain or shortness of breath at present.  After receiving Zofran patient states that nausea has resolved.  No further diarrhea while in the emergency department ____________________________________________  FINAL CLINICAL IMPRESSION(S) / ED DIAGNOSES  Final diagnoses:  Nausea vomiting and diarrhea     MEDICATIONS GIVEN DURING THIS VISIT:  Medications  ondansetron (ZOFRAN-ODT) disintegrating tablet 4 mg (4 mg Oral Given 03/18/19 6578)     ED Discharge Orders    None      *Please note:  Katelyn Lane was evaluated in Emergency Department on 03/18/2019 for the symptoms described in the history of present illness. She was evaluated in the context of the global COVID-19 pandemic, which necessitated consideration that the  patient might be at risk for infection with the SARS-CoV-2 virus that causes COVID-19. Institutional protocols and algorithms that pertain to the evaluation of patients at risk for COVID-19 are in a state of rapid change based on information released by regulatory bodies including the CDC and federal and state organizations. These policies and algorithms were followed during the patient's care in the ED.  Some ED evaluations and interventions may be delayed as a result of limited staffing during the pandemic.*  Note:  This document was prepared using Dragon voice recognition software and may include unintentional dictation errors.   Gregor Hams, MD 03/19/19 [phone]

## 2019-03-18 NOTE — ED Triage Notes (Signed)
Pt to ED with continued SOB after a Positive Covid test on 03/02/2019. Pt is able to speak in complete sentences is breathing heavily. Oxygen saturation is 100% on RA. Continued productive cough with "dark" colored sputum  Pt also reporting generalized abd cramping that started yesterday with multiple episodes of diarrhea and 4 episodes of vomiting. No known fevers.

## 2019-03-23 ENCOUNTER — Telehealth: Payer: Self-pay | Admitting: Nurse Practitioner

## 2019-03-23 NOTE — Telephone Encounter (Signed)
Called patient to schedule Palliative Consult, no immediately declined services and wasn't interested in Palliative care.  Will notify Palliative Nurse Practitioner as well as PCP.

## 2019-03-24 ENCOUNTER — Encounter: Payer: Self-pay | Admitting: Emergency Medicine

## 2019-03-24 ENCOUNTER — Other Ambulatory Visit: Payer: Self-pay

## 2019-03-24 ENCOUNTER — Emergency Department: Payer: Medicaid Other

## 2019-03-24 ENCOUNTER — Inpatient Hospital Stay
Admission: EM | Admit: 2019-03-24 | Discharge: 2019-03-28 | DRG: 291 | Disposition: A | Discharge status: Home / Self Care | Payer: Medicaid Other | Attending: Hospitalist | Admitting: Hospitalist

## 2019-03-24 DIAGNOSIS — F1410 Cocaine abuse, uncomplicated: Secondary | ICD-10-CM | POA: Diagnosis present

## 2019-03-24 DIAGNOSIS — I513 Intracardiac thrombosis, not elsewhere classified: Secondary | ICD-10-CM | POA: Diagnosis present

## 2019-03-24 DIAGNOSIS — I130 Hypertensive heart and chronic kidney disease with heart failure and stage 1 through stage 4 chronic kidney disease, or unspecified chronic kidney disease: Secondary | ICD-10-CM | POA: Diagnosis not present

## 2019-03-24 DIAGNOSIS — I5043 Acute on chronic combined systolic (congestive) and diastolic (congestive) heart failure: Secondary | ICD-10-CM | POA: Diagnosis present

## 2019-03-24 DIAGNOSIS — D72819 Decreased white blood cell count, unspecified: Secondary | ICD-10-CM | POA: Diagnosis present

## 2019-03-24 DIAGNOSIS — I2510 Atherosclerotic heart disease of native coronary artery without angina pectoris: Secondary | ICD-10-CM | POA: Diagnosis present

## 2019-03-24 DIAGNOSIS — N1832 Chronic kidney disease, stage 3b: Secondary | ICD-10-CM | POA: Diagnosis present

## 2019-03-24 DIAGNOSIS — E785 Hyperlipidemia, unspecified: Secondary | ICD-10-CM | POA: Diagnosis present

## 2019-03-24 DIAGNOSIS — U071 COVID-19: Secondary | ICD-10-CM | POA: Diagnosis not present

## 2019-03-24 DIAGNOSIS — Z7901 Long term (current) use of anticoagulants: Secondary | ICD-10-CM | POA: Diagnosis not present

## 2019-03-24 DIAGNOSIS — F17200 Nicotine dependence, unspecified, uncomplicated: Secondary | ICD-10-CM | POA: Diagnosis present

## 2019-03-24 DIAGNOSIS — Z888 Allergy status to other drugs, medicaments and biological substances status: Secondary | ICD-10-CM

## 2019-03-24 DIAGNOSIS — I160 Hypertensive urgency: Secondary | ICD-10-CM | POA: Diagnosis present

## 2019-03-24 DIAGNOSIS — Z9119 Patient's noncompliance with other medical treatment and regimen: Secondary | ICD-10-CM

## 2019-03-24 DIAGNOSIS — I472 Ventricular tachycardia: Secondary | ICD-10-CM | POA: Diagnosis not present

## 2019-03-24 DIAGNOSIS — K582 Mixed irritable bowel syndrome: Secondary | ICD-10-CM | POA: Diagnosis present

## 2019-03-24 DIAGNOSIS — F329 Major depressive disorder, single episode, unspecified: Secondary | ICD-10-CM | POA: Diagnosis present

## 2019-03-24 DIAGNOSIS — R112 Nausea with vomiting, unspecified: Secondary | ICD-10-CM

## 2019-03-24 DIAGNOSIS — Z8616 Personal history of COVID-19: Secondary | ICD-10-CM | POA: Diagnosis not present

## 2019-03-24 DIAGNOSIS — Z90710 Acquired absence of both cervix and uterus: Secondary | ICD-10-CM

## 2019-03-24 DIAGNOSIS — J9601 Acute respiratory failure with hypoxia: Secondary | ICD-10-CM | POA: Diagnosis present

## 2019-03-24 DIAGNOSIS — I248 Other forms of acute ischemic heart disease: Secondary | ICD-10-CM | POA: Diagnosis present

## 2019-03-24 DIAGNOSIS — Z880 Allergy status to penicillin: Secondary | ICD-10-CM

## 2019-03-24 DIAGNOSIS — R0600 Dyspnea, unspecified: Secondary | ICD-10-CM

## 2019-03-24 DIAGNOSIS — I509 Heart failure, unspecified: Secondary | ICD-10-CM

## 2019-03-24 LAB — COMPREHENSIVE METABOLIC PANEL
ALT: 30 U/L (ref 0–44)
AST: 38 U/L (ref 15–41)
Albumin: 3.2 g/dL — ABNORMAL LOW (ref 3.5–5.0)
Alkaline Phosphatase: 107 U/L (ref 38–126)
Anion gap: 8 (ref 5–15)
BUN: 28 mg/dL — ABNORMAL HIGH (ref 8–23)
CO2: 20 mmol/L — ABNORMAL LOW (ref 22–32)
Calcium: 9 mg/dL (ref 8.9–10.3)
Chloride: 111 mmol/L (ref 98–111)
Creatinine, Ser: 1.97 mg/dL — ABNORMAL HIGH (ref 0.44–1.00)
GFR calc Af Amer: 31 mL/min — ABNORMAL LOW (ref >60)
GFR calc non Af Amer: 26 mL/min — ABNORMAL LOW (ref >60)
Glucose, Bld: 122 mg/dL — ABNORMAL HIGH (ref 70–99)
Potassium: 4.8 mmol/L (ref 3.5–5.1)
Sodium: 139 mmol/L (ref 135–145)
Total Bilirubin: 1 mg/dL (ref 0.3–1.2)
Total Protein: 6.3 g/dL — ABNORMAL LOW (ref 6.5–8.1)

## 2019-03-24 LAB — URINALYSIS, COMPLETE (UACMP) WITH MICROSCOPIC
Bilirubin Urine: NEGATIVE
Glucose, UA: NEGATIVE mg/dL
Hgb urine dipstick: NEGATIVE
Ketones, ur: NEGATIVE mg/dL
Leukocytes,Ua: NEGATIVE
Nitrite: NEGATIVE
Protein, ur: 100 mg/dL — AB
Specific Gravity, Urine: 1.012 (ref 1.005–1.030)
pH: 6 (ref 5.0–8.0)

## 2019-03-24 LAB — CBC WITH DIFFERENTIAL/PLATELET
Abs Immature Granulocytes: 0 10*3/uL (ref 0.00–0.07)
Basophils Absolute: 0 10*3/uL (ref 0.0–0.1)
Basophils Relative: 1 %
Eosinophils Absolute: 0.2 10*3/uL (ref 0.0–0.5)
Eosinophils Relative: 5 %
HCT: 38.4 % (ref 36.0–46.0)
Hemoglobin: 11.3 g/dL — ABNORMAL LOW (ref 12.0–15.0)
Immature Granulocytes: 0 %
Lymphocytes Relative: 31 %
Lymphs Abs: 1.1 10*3/uL (ref 0.7–4.0)
MCH: 24.4 pg — ABNORMAL LOW (ref 26.0–34.0)
MCHC: 29.4 g/dL — ABNORMAL LOW (ref 30.0–36.0)
MCV: 82.8 fL (ref 80.0–100.0)
Monocytes Absolute: 0.5 10*3/uL (ref 0.1–1.0)
Monocytes Relative: 14 %
Neutro Abs: 1.6 10*3/uL — ABNORMAL LOW (ref 1.7–7.7)
Neutrophils Relative %: 49 %
Platelets: 313 10*3/uL (ref 150–400)
RBC: 4.64 MIL/uL (ref 3.87–5.11)
RDW: 19.9 % — ABNORMAL HIGH (ref 11.5–15.5)
WBC: 3.3 10*3/uL — ABNORMAL LOW (ref 4.0–10.5)
nRBC: 0 % (ref 0.0–0.2)

## 2019-03-24 LAB — BRAIN NATRIURETIC PEPTIDE: B Natriuretic Peptide: 4174 pg/mL — ABNORMAL HIGH (ref 0.0–100.0)

## 2019-03-24 LAB — TROPONIN I (HIGH SENSITIVITY)
Troponin I (High Sensitivity): 75 ng/L — ABNORMAL HIGH (ref <18)
Troponin I (High Sensitivity): 93 ng/L — ABNORMAL HIGH (ref <18)

## 2019-03-24 LAB — CBC
HCT: 38.1 % (ref 36.0–46.0)
Hemoglobin: 11.2 g/dL — ABNORMAL LOW (ref 12.0–15.0)
MCH: 24.3 pg — ABNORMAL LOW (ref 26.0–34.0)
MCHC: 29.4 g/dL — ABNORMAL LOW (ref 30.0–36.0)
MCV: 82.8 fL (ref 80.0–100.0)
Platelets: 317 10*3/uL (ref 150–400)
RBC: 4.6 MIL/uL (ref 3.87–5.11)
RDW: 20 % — ABNORMAL HIGH (ref 11.5–15.5)
WBC: 3.5 10*3/uL — ABNORMAL LOW (ref 4.0–10.5)
nRBC: 0 % (ref 0.0–0.2)

## 2019-03-24 LAB — URINE DRUG SCREEN, QUALITATIVE (ARMC ONLY)
Amphetamines, Ur Screen: NOT DETECTED
Barbiturates, Ur Screen: NOT DETECTED
Benzodiazepine, Ur Scrn: NOT DETECTED
Cannabinoid 50 Ng, Ur ~~LOC~~: NOT DETECTED
Cocaine Metabolite,Ur ~~LOC~~: POSITIVE — AB
MDMA (Ecstasy)Ur Screen: NOT DETECTED
Methadone Scn, Ur: NOT DETECTED
Opiate, Ur Screen: POSITIVE — AB
Phencyclidine (PCP) Ur S: NOT DETECTED
Tricyclic, Ur Screen: NOT DETECTED

## 2019-03-24 LAB — LIPASE, BLOOD: Lipase: 37 U/L (ref 11–51)

## 2019-03-24 MED ORDER — FUROSEMIDE 10 MG/ML IJ SOLN
40.0000 mg | Freq: Two times a day (BID) | INTRAMUSCULAR | Status: DC
  Administered 2019-03-25: 10:00:00 40 mg via INTRAVENOUS
  Filled 2019-03-24: qty 4

## 2019-03-24 MED ORDER — ONDANSETRON HCL 4 MG/2ML IJ SOLN
4.0000 mg | Freq: Once | INTRAMUSCULAR | Status: AC
  Administered 2019-03-24: 19:00:00 4 mg via INTRAVENOUS
  Filled 2019-03-24: qty 2

## 2019-03-24 MED ORDER — FUROSEMIDE 10 MG/ML IJ SOLN
40.0000 mg | Freq: Once | INTRAMUSCULAR | Status: AC
  Administered 2019-03-24: 21:00:00 40 mg via INTRAVENOUS
  Filled 2019-03-24: qty 4

## 2019-03-24 MED ORDER — TRAZODONE HCL 50 MG PO TABS: 150.0000 mg | ORAL_TABLET | Freq: Every evening | ORAL | Status: DC | PRN

## 2019-03-24 MED ORDER — ALBUTEROL SULFATE HFA 108 (90 BASE) MCG/ACT IN AERS: 2.0000 | INHALATION_SPRAY | Freq: Four times a day (QID) | RESPIRATORY_TRACT | Status: DC | PRN

## 2019-03-24 MED ORDER — SPIRONOLACTONE 25 MG PO TABS
12.5000 mg | ORAL_TABLET | Freq: Every day | ORAL | Status: DC
  Administered 2019-03-26 – 2019-03-28 (×3): 12.5 mg via ORAL
  Filled 2019-03-24: qty 1
  Filled 2019-03-24 (×2): qty 0.5
  Filled 2019-03-24: qty 1
  Filled 2019-03-24 (×2): qty 0.5

## 2019-03-24 MED ORDER — FLUOXETINE HCL 20 MG PO CAPS
40.0000 mg | ORAL_CAPSULE | Freq: Every day | ORAL | Status: DC
  Administered 2019-03-25 – 2019-03-28 (×4): 40 mg via ORAL
  Filled 2019-03-24 (×5): qty 2

## 2019-03-24 MED ORDER — ATORVASTATIN CALCIUM 20 MG PO TABS
20.0000 mg | ORAL_TABLET | Freq: Every day | ORAL | Status: DC
  Administered 2019-03-25 – 2019-03-27 (×3): 20 mg via ORAL
  Filled 2019-03-24 (×3): qty 1

## 2019-03-24 MED ORDER — SODIUM CHLORIDE 0.9 % IV SOLN: 250.0000 mL | INTRAVENOUS | Status: DC | PRN

## 2019-03-24 MED ORDER — DICYCLOMINE HCL 10 MG PO CAPS
10.0000 mg | ORAL_CAPSULE | Freq: Three times a day (TID) | ORAL | Status: DC | PRN
  Administered 2019-03-28 (×2): 10 mg via ORAL
  Filled 2019-03-24 (×4): qty 1

## 2019-03-24 MED ORDER — NITROGLYCERIN 0.4 MG SL SUBL: 0.4000 mg | SUBLINGUAL_TABLET | SUBLINGUAL | Status: DC | PRN

## 2019-03-24 MED ORDER — SODIUM CHLORIDE 0.9% FLUSH: 3.0000 mL | INTRAVENOUS | Status: DC | PRN

## 2019-03-24 MED ORDER — ONDANSETRON HCL 4 MG/2ML IJ SOLN: 4.0000 mg | Freq: Four times a day (QID) | INTRAMUSCULAR | Status: DC | PRN

## 2019-03-24 MED ORDER — SODIUM CHLORIDE 0.9% FLUSH: 3.0000 mL | Freq: Once | INTRAVENOUS | Status: DC

## 2019-03-24 MED ORDER — ALUM & MAG HYDROXIDE-SIMETH 200-200-20 MG/5ML PO SUSP
30.0000 mL | ORAL | Status: DC | PRN
  Administered 2019-03-27 – 2019-03-28 (×2): 30 mL via ORAL
  Filled 2019-03-24 (×2): qty 30

## 2019-03-24 MED ORDER — ZINC SULFATE 220 (50 ZN) MG PO CAPS
220.0000 mg | ORAL_CAPSULE | Freq: Every day | ORAL | Status: DC
  Administered 2019-03-25 – 2019-03-28 (×4): 220 mg via ORAL
  Filled 2019-03-24 (×4): qty 1

## 2019-03-24 MED ORDER — RIVAROXABAN 20 MG PO TABS
20.0000 mg | ORAL_TABLET | Freq: Every day | ORAL | Status: DC
  Administered 2019-03-25 – 2019-03-27 (×3): 20 mg via ORAL
  Filled 2019-03-24 (×3): qty 1

## 2019-03-24 MED ORDER — ZOLPIDEM TARTRATE 5 MG PO TABS: 5.0000 mg | ORAL_TABLET | Freq: Every evening | ORAL | Status: DC | PRN

## 2019-03-24 MED ORDER — DICYCLOMINE HCL 10 MG/ML IM SOLN
20.0000 mg | Freq: Once | INTRAMUSCULAR | Status: AC
  Administered 2019-03-24: 21:00:00 20 mg via INTRAMUSCULAR
  Filled 2019-03-24 (×2): qty 2

## 2019-03-24 MED ORDER — MORPHINE SULFATE (PF) 4 MG/ML IV SOLN
4.0000 mg | Freq: Once | INTRAVENOUS | Status: AC
  Administered 2019-03-24: 19:00:00 4 mg via INTRAVENOUS
  Filled 2019-03-24: qty 1

## 2019-03-24 MED ORDER — SACUBITRIL-VALSARTAN 24-26 MG PO TABS
1.0000 | ORAL_TABLET | Freq: Two times a day (BID) | ORAL | Status: DC
  Administered 2019-03-25 (×2): 1 via ORAL
  Filled 2019-03-24 (×3): qty 1

## 2019-03-24 MED ORDER — CARVEDILOL 3.125 MG PO TABS
3.1250 mg | ORAL_TABLET | Freq: Two times a day (BID) | ORAL | Status: DC
  Administered 2019-03-25 – 2019-03-28 (×7): 3.125 mg via ORAL
  Filled 2019-03-24 (×7): qty 1

## 2019-03-24 MED ORDER — ACETAMINOPHEN 325 MG PO TABS: 650.0000 mg | ORAL_TABLET | ORAL | Status: DC | PRN

## 2019-03-24 MED ORDER — SODIUM CHLORIDE 0.9% FLUSH
3.0000 mL | Freq: Two times a day (BID) | INTRAVENOUS | Status: DC
  Administered 2019-03-25 – 2019-03-28 (×6): 3 mL via INTRAVENOUS

## 2019-03-24 MED ORDER — ASCORBIC ACID 500 MG PO TABS
500.0000 mg | ORAL_TABLET | Freq: Every day | ORAL | Status: DC
  Administered 2019-03-25 – 2019-03-28 (×4): 500 mg via ORAL
  Filled 2019-03-24 (×4): qty 1

## 2019-03-24 MED ORDER — ALPRAZOLAM 0.25 MG PO TABS
0.2500 mg | ORAL_TABLET | Freq: Two times a day (BID) | ORAL | Status: DC | PRN
  Administered 2019-03-25: 01:00:00 0.25 mg via ORAL
  Filled 2019-03-24: qty 1

## 2019-03-24 MED ORDER — POLYETHYLENE GLYCOL 3350 17 G PO PACK
17.0000 g | PACK | Freq: Every day | ORAL | Status: DC
  Administered 2019-03-25 – 2019-03-28 (×4): 17 g via ORAL
  Filled 2019-03-24 (×4): qty 1

## 2019-03-24 MED ORDER — LORATADINE 10 MG PO TABS: 10.0000 mg | ORAL_TABLET | Freq: Every day | ORAL | Status: DC | PRN

## 2019-03-24 NOTE — ED Triage Notes (Signed)
Pt presents to ED via POV with c/o generalized abdominal pain, pt states +Covid test "bout a week or two ago", pt with +covid test on 2/22. Pt reports generalized abdominal cramping with multiple episodes of vomiting, seen for same on 3/10 with diarrhea. Pt able to speak in full and complete sentences at this time without difficulty. 100% on RA in triage.

## 2019-03-24 NOTE — ED Provider Notes (Signed)
[facility] Emergency Department Provider Note ____________________________________________   First MD Initiated Contact with Patient 03/24/19 1739     (approximate)  I have reviewed the triage vital signs and the nursing notes.   HISTORY  Chief Complaint Shortness of Breath and Abdominal Pain  Level 5 caveat: History present illness limited due to poor historian  HPI Katelyn Lane is a 64 y.o. female with PMH as noted below who presents with generalized abdominal pain since last night, associated with both vomiting and diarrhea.  The patient also reports some cough and shortness of breath.  She has no fever or urinary symptoms.  Past Medical History:  Diagnosis Date  . CHF (congestive heart failure) ([place])   . Hyperlipidemia   . Renal disorder     Patient Active Problem List   Diagnosis Date Noted  . COVID-19 03/06/2019  . Acute respiratory failure with hypoxia ([place]) 03/06/2019  . Hypokalemia 03/06/2019  . LV (left ventricular) mural thrombus 03/06/2019  . CKD (chronic kidney disease), stage IIIa 03/06/2019  . Nausea and vomiting 02/16/2019  . Abdominal pain 02/16/2019  . Rhabdomyolysis 02/16/2019  . Acute renal failure superimposed on stage 3 chronic kidney disease ([place]) 02/16/2019  . Cocaine use 02/16/2019  . Tobacco abuse 02/16/2019  . Acute on chronic systolic CHF (congestive heart failure) ([place]) 02/16/2019  . Acute congestive heart failure ([place]) 02/13/2019  . Respiratory failure with hypoxia ([place]) 01/31/2019  . Stroke ([place]) 01/18/2019  . Dilated cardiomyopathy ([place]) 12/31/2018  . Chest tightness 12/31/2018  . Elevated troponin 12/31/2018  . Hypertensive urgency 12/31/2018  . Dyslipidemia 12/31/2018  . Depression 12/31/2018  . HFrEF (heart failure with reduced ejection fraction) ([place]) 12/29/2018    Past Surgical History:  Procedure Laterality Date  . RIGHT/LEFT HEART CATH AND CORONARY ANGIOGRAPHY N/A 12/30/2018   Procedure:  RIGHT/LEFT HEART CATH AND CORONARY ANGIOGRAPHY;  Surgeon: Corey Skains, MD;  Location: [facility];  Service: Cardiovascular;  Laterality: N/A;    Prior to Admission medications   Medication Sig Start Date End Date Taking? Authorizing Provider  albuterol (VENTOLIN HFA) 108 (90 Base) MCG/ACT inhaler Inhale 2 puffs into the lungs every 6 (six) hours as needed for wheezing or shortness of breath. 01/15/19   Rudene Re, MD  alum & mag hydroxide-simeth (MAALOX/MYLANTA) 200-200-20 MG/5ML suspension Take 30 mLs by mouth every 4 (four) hours as needed for indigestion or heartburn. 02/16/19   Dhungel, Flonnie Overman, MD  ascorbic acid (VITAMIN C) 500 MG tablet Take 1 tablet (500 mg total) by mouth daily. 03/10/19   Max Sane, MD  atorvastatin (LIPITOR) 20 MG tablet Take 20 mg by mouth daily at 6 PM.     [provider]  carvedilol (COREG) 3.125 MG tablet Take 1 tablet (3.125 mg total) by mouth 2 (two) times daily with a meal. 02/16/19   Dhungel, Nishant, MD  dicyclomine (BENTYL) 10 MG capsule Take 1 capsule (10 mg total) by mouth 3 (three) times daily as needed for spasms. Patient taking differently: Take 10 mg by mouth 4 (four) times daily.  02/22/19   Harvest Dark, MD  FLUoxetine (PROZAC) 40 MG capsule Take 40 mg by mouth daily.     [provider]  furosemide (LASIX) 40 MG tablet Take 1 tablet (40 mg total) by mouth daily. 01/21/19 03/22/19  Lavina Hamman, MD  loratadine (CLARITIN) 10 MG tablet Take 1 tablet (10 mg total) by mouth daily. Patient taking differently: Take 10 mg by mouth daily as needed  for itching.  01/02/19   Ezekiel Slocumb, DO  nicotine (NICODERM CQ - DOSED IN MG/24 HOURS) 21 mg/24hr patch Place 1 patch (21 mg total) onto the skin daily. 02/17/19   Dhungel, Flonnie Overman, MD  nitroGLYCERIN (NITROSTAT) 0.4 MG SL tablet Place 1 tablet (0.4 mg total) under the tongue every 5 (five) minutes as needed for chest pain. 02/16/19   Dhungel, Nishant, MD  polyethylene  glycol (MIRALAX) 17 g packet Take 17 g by mouth daily. 02/21/19   Nance Pear, MD  rivaroxaban (XARELTO) 20 MG TABS tablet Take 20 mg by mouth daily with supper.    [provider]  sacubitril-valsartan (ENTRESTO) 24-26 MG Take 1 tablet by mouth 2 (two) times daily.    [provider]  spironolactone (ALDACTONE) 25 MG tablet Take 0.5 tablets (12.5 mg total) by mouth daily. 01/02/19   Ezekiel Slocumb, DO  traZODone (DESYREL) 100 MG tablet Take 150 mg by mouth at bedtime as needed for sleep.  09/08/13   [provider]  zinc sulfate 220 (50 Zn) MG capsule Take 1 capsule (220 mg total) by mouth daily. 03/10/19   Max Sane, MD    Allergies Penicillins and Ace inhibitors  Family History  Problem Relation Age of Onset  . Breast cancer Neg Hx     Social History Social History   Tobacco Use  . Smoking status: Current Every Day Smoker  . Smokeless tobacco: Never Used  Substance Use Topics  . Alcohol use: No    Alcohol/week: 0.0 standard drinks  . Drug use: Yes    Types: Cocaine    Comment: 4-5 days ago    Review of Systems Level 5 caveat: Review of systems limited due to poor historian Constitutional: No fever. Respiratory: Positive for shortness of breath. Gastrointestinal: Positive for vomiting and diarrhea.  Musculoskeletal: Negative for back pain. Skin: Negative for rash. Neurological: Negative for headache.   ____________________________________________   PHYSICAL EXAM:  VITAL SIGNS: ED Triage Vitals [03/24/19 1353]  Enc Vitals Group     BP (!) 131/97     Pulse Rate 65     Resp (!) 24     Temp 97.6 F (36.4 C)     Temp Source Oral     SpO2 100 %     Weight 215 lb (97.5 kg)     Height 5\' 6"  (1.676 m)     Head Circumference      Peak Flow      Pain Score 10     Pain Loc      Pain Edu?      Excl. in [place]?     Constitutional: Alert and oriented.  Uncomfortable appearing but in no acute distress. Eyes: Conjunctivae are normal.  No  scleral icterus. Head: Atraumatic. Nose: No congestion/rhinnorhea. Mouth/Throat: Mucous membranes are dry.   Neck: Normal range of motion.  Cardiovascular: Normal rate, regular rhythm.  Good peripheral circulation. Respiratory: Normal respiratory effort.  No retractions.  Gastrointestinal: Soft with mild diffuse discomfort but no focal tenderness.  No distention.  Genitourinary: No flank tenderness. Musculoskeletal: No lower extremity edema.  Extremities warm and well perfused.  Neurologic:  Normal speech and language. No gross focal neurologic deficits are appreciated.  Skin:  Skin is warm and dry. No rash noted. Psychiatric: Mood and affect are normal. Speech and behavior are normal.  ____________________________________________   LABS (all labs ordered are listed, but only abnormal results are displayed)  Labs Reviewed  COMPREHENSIVE METABOLIC PANEL - Abnormal;  Notable for the following components:      Result Value   CO2 20 (*)    Glucose, Bld 122 (*)    BUN 28 (*)    Creatinine, Ser 1.97 (*)    Total Protein 6.3 (*)    Albumin 3.2 (*)    GFR calc non Af Amer 26 (*)    GFR calc Af Amer 31 (*)    All other components within normal limits  CBC - Abnormal; Notable for the following components:   WBC 3.5 (*)    Hemoglobin 11.2 (*)    MCH 24.3 (*)    MCHC 29.4 (*)    RDW 20.0 (*)    All other components within normal limits  URINALYSIS, COMPLETE (UACMP) WITH MICROSCOPIC - Abnormal; Notable for the following components:   Color, Urine YELLOW (*)    APPearance CLEAR (*)    Protein, ur 100 (*)    Bacteria, UA RARE (*)    All other components within normal limits  BRAIN NATRIURETIC PEPTIDE - Abnormal; Notable for the following components:   B Natriuretic Peptide 4,174.0 (*)    All other components within normal limits  CBC WITH DIFFERENTIAL/PLATELET - Abnormal; Notable for the following components:   WBC 3.3 (*)    Hemoglobin 11.3 (*)    MCH 24.4 (*)    MCHC 29.4 (*)     RDW 19.9 (*)    Neutro Abs 1.6 (*)    All other components within normal limits  TROPONIN I (HIGH SENSITIVITY) - Abnormal; Notable for the following components:   Troponin I (High Sensitivity) 75 (*)    All other components within normal limits  TROPONIN I (HIGH SENSITIVITY) - Abnormal; Notable for the following components:   Troponin I (High Sensitivity) 93 (*)    All other components within normal limits  LIPASE, BLOOD  URINE DRUG SCREEN, QUALITATIVE ([facility] ONLY)   ____________________________________________  EKG  ED ECG REPORT I, Arta Silence, the attending physician, personally viewed and interpreted this ECG.  Date: 03/24/2019 EKG Time: 1358 Rate: 75 Rhythm: normal sinus rhythm QRS Axis: Left axis Intervals: normal ST/T Wave abnormalities: Repolarization abnormality Narrative Interpretation: no evidence of acute ischemia  ____________________________________________  RADIOLOGY  CXR: Bilateral infiltrates consistent with multilobar pneumonia CT abdomen: Groundglass opacities in the lung bases.  No significant acute intra-abdominal findings.  ____________________________________________   PROCEDURES  Procedure(s) performed: No  Procedures  Critical Care performed: No ____________________________________________   INITIAL IMPRESSION / ASSESSMENT AND PLAN / ED COURSE  Pertinent labs & imaging results that were available during my care of the patient were reviewed by me and considered in my medical decision making (see chart for details).  64 year old female with PMH as noted above presents primarily with diffuse abdominal pain associate with vomiting and diarrhea, as well as some persistent shortness of breath and cough after a recent diagnosis of COVID-19.  I reviewed the past medical records in [place].  The patient was initially diagnosed with Covid on 2/22.  She was admitted to [facility] on 2/22 for respiratory distress and confusion, thought to be due to CHF.   She was subsequently admitted here on 2/26 due to COVID-19 pneumonitis and hypoxic respiratory failure, discharged on 3/2.  She was seen in the ED again 6 days ago for similar GI symptoms to today's presentation, improved with medications, and was released from the ED.  On exam, the patient is uncomfortable appearing but in no acute distress.  Her vital signs are normal.  She has mild diffuse discomfort on abdominal exam but no focal tenderness.  Differential includes gastroenteritis, gastritis, colitis, diverticulitis, symptoms related to COVID-19, or less likely cardiac etiology.  We will obtain a lab work-up, give analgesia and antiemetic, and obtain a CT abdomen to further evaluate.  ----------------------------------------- 9:06 PM on 03/24/2019 -----------------------------------------  Lab work-up shows an elevated troponin which appears chronic for the patient.  The BNP is also higher than prior.  However, the patient's O2 saturation is in the low to mid 90s and she does not demonstrate respiratory distress at this time.  I doubt an acute CHF exacerbation.  Her other lab work-up is unremarkable, and the CT shows no significant intra-abdominal findings.  On reassessment, the patient appears more comfortable and is eager to try to drink and eat.  She is no longer vomiting.    I discussed the results of the work-up with her.  We will give additional medication including Bentyl and IV Lasix for diuresis and then reassess.  I signed her out to the oncoming physician Dr. Kerman Passey.  ____________________________________________   FINAL CLINICAL IMPRESSION(S) / ED DIAGNOSES  Final diagnoses:  Non-intractable vomiting with nausea, unspecified vomiting type      NEW MEDICATIONS STARTED DURING THIS VISIT:  New Prescriptions   No medications on file     Note:  This document was prepared using Dragon voice recognition software and may include unintentional dictation errors.      Arta Silence, MD 03/24/19 2107

## 2019-03-24 NOTE — ED Notes (Signed)
Called lab to add on UDS. 

## 2019-03-24 NOTE — H&P (Addendum)
Katelyn Lane    MR#:  [id number]  DATE OF BIRTH:  1955-11-15  DATE OF ADMISSION:  03/24/2019  PRIMARY CARE PHYSICIAN: Alene Mires Elyse Jarvis, MD   REQUESTING/REFERRING PHYSICIAN: Harvest Dark, MD CHIEF COMPLAINT:   Chief Complaint  Patient presents with  . Shortness of Breath  . Abdominal Pain    HISTORY OF PRESENT ILLNESS:  Katelyn Lane  is a 64 y.o. female with a known history of combined systolic and diastolic CHF as well as FBPZW-25 with recent admission last month at [facility] for acute respiratory failure secondary to both COVID-19 pneumonia and acute on chronic CHF followed by an admission here for the same from 2/26-3/2.  She presents emergency room with acute onset of worsening dyspnea and orthopnea with paroxysmal nocturnal dyspnea without worsening lower extremity edema.  She admitted to wheezing and excessive cough with epigastric and periumbilical abdominal pain.  She had nausea and vomiting yesterday without diarrhea.  No bilious vomitus or hematemesis.  No melena or bright red bleeding per rectum.   Upon presentation to the emergency room, blood pressure was 131/97 with respiratory to 24 and her blood pressure was up to 161/111.  Labs revealed unremarkable CMP.  Her BNP was significantly elevated at 4174 compared with 2973 on 2/26.  High-sensitivity troponin I was 75 and later 93 compared to 87 on 3/10.  CBC showed stable leukopenia of 3.3 and hemoglobin 11.3 with hematocrit 30.4 with platelets of 313.  Urine drug screen came back positive for cocaine and opiates.  UA was unremarkable.  Chest x-ray showed mild multilobar pneumonia with a spectrum of findings commonly seen in the setting of COVID-19 infection, mild to moderate cardiomegaly and aortic atherosclerosis.  Abdominal and pelvic CT scan showed the following: 1. There are ground-glass airspace opacities at the lung bases medially, greatest at the right lung  base. Differential considerations include infection and inflammation. 2. Rectosigmoid diverticulosis without evidence of acute diverticulitis. 3. Small amount of free fluid in the upper abdomen. 4. Aortic Atherosclerosis (ICD10-I70.0). EKG showed normal sinus rhythm with a rate of 75 with possible left atrial enlargement, left axis deviation, lateral T wave inversion and LVH by voltage criteria with Q waves inferiorly.  She was given 20 mg IM Bentyl, 4 mg of IV Lasix, 4 mg IV morphine sulfate and 4 mg IV Zofran.  She will be admitted to a telemetry bed for further evaluation and management. PAST MEDICAL HISTORY:   Past Medical History:  Diagnosis Date  . CHF (congestive heart failure) ([place])   . Hyperlipidemia   . Renal disorder   COVID-19  PAST SURGICAL HISTORY:   Past Surgical History:  Procedure Laterality Date  . RIGHT/LEFT HEART CATH AND CORONARY ANGIOGRAPHY N/A 12/30/2018   Procedure: RIGHT/LEFT HEART CATH AND CORONARY ANGIOGRAPHY;  Surgeon: Corey Skains, MD;  Location: [facility];  Service: Cardiovascular;  Laterality: N/A;    SOCIAL HISTORY:   Social History   Tobacco Use  . Smoking status: Current Every Day Smoker  . Smokeless tobacco: Never Used  Substance Use Topics  . Alcohol use: No    Alcohol/week: 0.0 standard drinks    FAMILY HISTORY:   Family History  Problem Relation Age of Onset  . Breast cancer Neg Hx     DRUG ALLERGIES:   Allergies  Allergen Reactions  . Penicillins Hives, Rash and Other (See Comments)    Did it involve swelling of the face/tongue/throat, SOB,  or low BP? No Did it involve sudden or severe rash/hives, skin peeling, or any reaction on the inside of your mouth or nose? Yes Did you need to seek medical attention at a hospital or doctor's office? Yes When did it last happen? >25 years If all above answers are "NO", may proceed with cephalosporin use.   . Ace Inhibitors Nausea And Vomiting    REVIEW OF  SYSTEMS:   ROS As per history of present illness. All pertinent systems were reviewed above. Constitutional,  HEENT, cardiovascular, respiratory, GI, GU, musculoskeletal, neuro, psychiatric, endocrine,  integumentary and hematologic systems were reviewed and are otherwise  negative/unremarkable except for positive findings mentioned above in the HPI.   MEDICATIONS AT HOME:   Prior to Admission medications   Medication Sig Start Date End Date Taking? Authorizing Provider  albuterol (VENTOLIN HFA) 108 (90 Base) MCG/ACT inhaler Inhale 2 puffs into the lungs every 6 (six) hours as needed for wheezing or shortness of breath. 01/15/19  Yes Alfred Levins, [place], MD  alum & mag hydroxide-simeth (MAALOX/MYLANTA) 200-200-20 MG/5ML suspension Take 30 mLs by mouth every 4 (four) hours as needed for indigestion or heartburn. 02/16/19  Yes Dhungel, Nishant, MD  atorvastatin (LIPITOR) 20 MG tablet Take 20 mg by mouth daily at 6 PM.    Yes [provider]  carvedilol (COREG) 3.125 MG tablet Take 1 tablet (3.125 mg total) by mouth 2 (two) times daily with a meal. 02/16/19  Yes Dhungel, Nishant, MD  FLUoxetine (PROZAC) 40 MG capsule Take 40 mg by mouth daily.    Yes [provider]  furosemide (LASIX) 40 MG tablet Take 1 tablet (40 mg total) by mouth daily. 01/21/19 03/24/19 Yes Lavina Hamman, MD  loratadine (CLARITIN) 10 MG tablet Take 1 tablet (10 mg total) by mouth daily. Patient taking differently: Take 10 mg by mouth daily as needed for itching.  01/02/19  Yes Nicole Kindred A, DO  nitroGLYCERIN (NITROSTAT) 0.4 MG SL tablet Place 1 tablet (0.4 mg total) under the tongue every 5 (five) minutes as needed for chest pain. 02/16/19  Yes Dhungel, Nishant, MD  polyethylene glycol (MIRALAX) 17 g packet Take 17 g by mouth daily. 02/21/19  Yes Nance Pear, MD  rivaroxaban (XARELTO) 20 MG TABS tablet Take 20 mg by mouth daily with supper.   Yes [provider]  sacubitril-valsartan (ENTRESTO)  24-26 MG Take 1 tablet by mouth 2 (two) times daily.   Yes [provider]  spironolactone (ALDACTONE) 25 MG tablet Take 0.5 tablets (12.5 mg total) by mouth daily. 01/02/19  Yes Nicole Kindred A, DO  traZODone (DESYREL) 100 MG tablet Take 150 mg by mouth at bedtime as needed for sleep.  09/08/13  Yes [provider]  ascorbic acid (VITAMIN C) 500 MG tablet Take 1 tablet (500 mg total) by mouth daily. Patient not taking: Reported on 03/24/2019 03/10/19   Max Sane, MD  dicyclomine (BENTYL) 10 MG capsule Take 1 capsule (10 mg total) by mouth 3 (three) times daily as needed for spasms. Patient not taking: Reported on 03/24/2019 02/22/19   Harvest Dark, MD  zinc sulfate 220 (50 Zn) MG capsule Take 1 capsule (220 mg total) by mouth daily. Patient not taking: Reported on 03/24/2019 03/10/19   Max Sane, MD      VITAL SIGNS:  Blood pressure (!) 135/104, pulse 88, temperature 97.6 F (36.4 C), temperature source Oral, resp. rate (!) 22, height 5\' 6"  (1.676 m), weight 97.5 kg, SpO2 96 %.  PHYSICAL EXAMINATION:  Physical Exam  GENERAL:  64 y.o.-year-old patient lying in the bed with no acute distress.  EYES: Pupils equal, round, reactive to light and accommodation. No scleral icterus. Extraocular muscles intact.  HEENT: Head atraumatic, normocephalic. Oropharynx and nasopharynx clear.  NECK:  Supple, no jugular venous distention. No thyroid enlargement, no tenderness.  LUNGS: Normal breath sounds bilaterally, no wheezing, rales,rhonchi or crepitation. No use of accessory muscles of respiration.  CARDIOVASCULAR: Regular rate and rhythm, S1, S2 normal. No murmurs, rubs, or gallops.  ABDOMEN: Soft, nondistended, nontender. Bowel sounds present. No organomegaly or mass.  EXTREMITIES: No pedal edema, cyanosis, or clubbing.  NEUROLOGIC: Cranial nerves II through XII are intact. Muscle strength 5/5 in all extremities. Sensation intact. Gait not checked.  PSYCHIATRIC: The patient is  alert and oriented x 3.  Normal affect and good eye contact. SKIN: No obvious rash, lesion, or ulcer.   LABORATORY PANEL:   CBC Recent Labs  Lab 03/24/19 1752  WBC 3.3*  HGB 11.3*  HCT 38.4  PLT 313   ------------------------------------------------------------------------------------------------------------------  Chemistries  Recent Labs  Lab 03/24/19 1407  NA 139  K 4.8  CL 111  CO2 20*  GLUCOSE 122*  BUN 28*  CREATININE 1.97*  CALCIUM 9.0  AST 38  ALT 30  ALKPHOS 107  BILITOT 1.0   ------------------------------------------------------------------------------------------------------------------  Cardiac Enzymes No results for input(s): TROPONINI in the last 168 hours. ------------------------------------------------------------------------------------------------------------------  RADIOLOGY:  CT ABDOMEN PELVIS WO CONTRAST  Result Date: 03/24/2019 CLINICAL DATA:  Abdominal pain. EXAM: CT ABDOMEN AND PELVIS WITHOUT CONTRAST TECHNIQUE: Multidetector CT imaging of the abdomen and pelvis was performed following the standard protocol without IV contrast. COMPARISON:  02/13/2019 FINDINGS: Lower chest: There are ground-glass airspace opacities at the lung bases medially, greatest at the right lung base.The heart is enlarged. Hepatobiliary: The liver is normal. Normal gallbladder.There is no biliary ductal dilation. Pancreas: Normal contours without ductal dilatation. No peripancreatic fluid collection. Spleen: No splenic laceration or hematoma. Adrenals/Urinary Tract: --Adrenal glands: No adrenal hemorrhage. --Right kidney/ureter: No hydronephrosis or perinephric hematoma. --Left kidney/ureter: No hydronephrosis or perinephric hematoma. --Urinary bladder: Unremarkable. Stomach/Bowel: --Stomach/Duodenum: No hiatal hernia or other gastric abnormality. Normal duodenal course and caliber. --Small bowel: No dilatation or inflammation. --Colon: Rectosigmoid diverticulosis without  acute inflammation. --Appendix: Normal. Vascular/Lymphatic: Atherosclerotic calcification is present within the non-aneurysmal abdominal aorta, without hemodynamically significant stenosis. --No retroperitoneal lymphadenopathy. --No mesenteric lymphadenopathy. --No pelvic or inguinal lymphadenopathy. Reproductive: Status post hysterectomy. No adnexal mass. Other: There is a small amount of free fluid in the upper abdomen. The abdominal wall is normal. Musculoskeletal. No acute displaced fractures. IMPRESSION: 1. There are ground-glass airspace opacities at the lung bases medially, greatest at the right lung base. Differential considerations include infection and inflammation. 2. Rectosigmoid diverticulosis without evidence of acute diverticulitis. 3. Small amount of free fluid in the upper abdomen. 4. Aortic Atherosclerosis (ICD10-I70.0). Electronically Signed   By: Constance Holster M.D.   On: 03/24/2019 18:33   DG Chest 2 View  Result Date: 03/24/2019 CLINICAL DATA:  64 year old female with history of shortness of breath. Tested positive for COVID 19 a week or 2 ago. EXAM: CHEST - 2 VIEW COMPARISON:  Chest x-ray 03/18/2019. FINDINGS: Ill-defined opacities and areas of interstitial prominence noted throughout the mid to lower lungs bilaterally, suggesting mild multilobar bilateral pneumonia. No pleural effusions. No evidence of pulmonary edema. Mild to moderate cardiomegaly. Upper mediastinal contours are within normal limits. Aortic atherosclerosis. IMPRESSION: 1. The appearance of the chest is compatible with mild multilobar pneumonia,  with a spectrum of findings commonly seen in the setting of COVID-19 infection. 2. Mild to moderate cardiomegaly. 3. Aortic atherosclerosis. Electronically Signed   By: Vinnie Langton M.D.   On: 03/24/2019 15:15      IMPRESSION AND PLAN:   1.  Acute on chronic combined systolic and diastolic CHF with EF of less than 20% and grade 3 diastolic dysfunction with  subsequent acute hypoxic respiratory failure with history of recent COVID-19 last month with residual lung findings.  She has a history of left ventricular mural thrombus, on Xarelto. -The patient will be admitted to telemetry bed. -We will diurese with IV Lasix and continue Entresto and Aldactone as well as Coreg -We will obtain inflammatory markers. -A cardiology consultation will be obtained. -I notified Dr. Ubaldo Glassing about the patient.  2.  Hypertensive urgency. -This could be contributing to her acute CHF. -She will be placed on as needed IV labetalol. -We will continue her antihypertensives.  3.  Elevated troponin I. -Has had elevated troponin last month as well. -We will follow serial troponin I. -This likely demand ischemia with her acute CHF.  4.  Abdominal pain. -Abdominal and pelvic CT scan showed no acute findings. -This could be musculoskeletal due to excessive cough. -The patient will be placed on as needed Tussionex and scheduled Mucinex. -Pain management will be provided.  5.  Dyslipidemia. -We will continue statin therapy.  6.  Depression. -We will continue Prozac.  7.  DVT prophylaxis. -We will continue Xarelto.    All the records are reviewed and case discussed with ED provider. The plan of care was discussed in details with the patient (and family). I answered all questions. The patient agreed to proceed with the above mentioned plan. Further management will depend upon hospital course.   CODE STATUS: Full code  TOTAL TIME TAKING CARE OF THIS PATIENT: 55 minutes.    Christel Mormon M.D on 03/24/2019 at 11:18 PM  [facility]   From 7 PM-7 AM, contact night-coverage [email or web address]  CC: Primary care physician; Revelo, Elyse Jarvis, MD   Note: This dictation was prepared with Dragon dictation along with smaller phrase technology. Any transcriptional errors that result from this process are unintentional.

## 2019-03-24 NOTE — ED Notes (Signed)
Called lab and per [facility] they will add on diff, BNP and trop to blood already in lab

## 2019-03-24 NOTE — ED Notes (Signed)
Patient provided with Sprite and meal tray, OK per EDP

## 2019-03-24 NOTE — ED Provider Notes (Signed)
-----------------------------------------   10:24 PM on 03/24/2019 -----------------------------------------  Patient care assumed from Dr. Deboraha Sprang.  Patient's troponin elevated from 75-93, her BNP is nearly double what it has been typically.  Patient maintains an O2 saturation around 90%.  Patient states she feels too short of breath to go home.  We will dose IV Lasix.  Patient will be admitted to the hospital service for further treatment.  Patient agreeable to plan of care.  Groundglass opacities on CT scan however patient did recover from Covid last month.   Harvest Dark, MD 03/24/19 2225

## 2019-03-24 NOTE — ED Notes (Signed)
Patient is very hyperactive and talking to herself. Asked patient if she was feeling anxious, said she's "just short of breath"

## 2019-03-25 ENCOUNTER — Encounter: Payer: Self-pay | Admitting: Family Medicine

## 2019-03-25 ENCOUNTER — Other Ambulatory Visit: Payer: Self-pay | Admitting: Internal Medicine

## 2019-03-25 DIAGNOSIS — I5043 Acute on chronic combined systolic (congestive) and diastolic (congestive) heart failure: Secondary | ICD-10-CM

## 2019-03-25 DIAGNOSIS — U071 COVID-19: Secondary | ICD-10-CM

## 2019-03-25 LAB — BASIC METABOLIC PANEL
Anion gap: 5 (ref 5–15)
BUN: 31 mg/dL — ABNORMAL HIGH (ref 8–23)
CO2: 25 mmol/L (ref 22–32)
Calcium: 8.9 mg/dL (ref 8.9–10.3)
Chloride: 112 mmol/L — ABNORMAL HIGH (ref 98–111)
Creatinine, Ser: 2.11 mg/dL — ABNORMAL HIGH (ref 0.44–1.00)
GFR calc Af Amer: 28 mL/min — ABNORMAL LOW (ref >60)
GFR calc non Af Amer: 24 mL/min — ABNORMAL LOW (ref >60)
Glucose, Bld: 107 mg/dL — ABNORMAL HIGH (ref 70–99)
Potassium: 4.6 mmol/L (ref 3.5–5.1)
Sodium: 142 mmol/L (ref 135–145)

## 2019-03-25 LAB — CBC WITH DIFFERENTIAL/PLATELET
Abs Immature Granulocytes: 0.01 10*3/uL (ref 0.00–0.07)
Basophils Absolute: 0 10*3/uL (ref 0.0–0.1)
Basophils Relative: 1 %
Eosinophils Absolute: 0.3 10*3/uL (ref 0.0–0.5)
Eosinophils Relative: 8 %
HCT: 37.2 % (ref 36.0–46.0)
Hemoglobin: 11 g/dL — ABNORMAL LOW (ref 12.0–15.0)
Immature Granulocytes: 0 %
Lymphocytes Relative: 41 %
Lymphs Abs: 1.8 10*3/uL (ref 0.7–4.0)
MCH: 24.4 pg — ABNORMAL LOW (ref 26.0–34.0)
MCHC: 29.6 g/dL — ABNORMAL LOW (ref 30.0–36.0)
MCV: 82.5 fL (ref 80.0–100.0)
Monocytes Absolute: 0.6 10*3/uL (ref 0.1–1.0)
Monocytes Relative: 15 %
Neutro Abs: 1.5 10*3/uL — ABNORMAL LOW (ref 1.7–7.7)
Neutrophils Relative %: 35 %
Platelets: 272 10*3/uL (ref 150–400)
RBC: 4.51 MIL/uL (ref 3.87–5.11)
RDW: 19.6 % — ABNORMAL HIGH (ref 11.5–15.5)
Smear Review: NORMAL
WBC: 4.3 10*3/uL (ref 4.0–10.5)
nRBC: 0 % (ref 0.0–0.2)

## 2019-03-25 LAB — FERRITIN: Ferritin: 40 ng/mL (ref 11–307)

## 2019-03-25 LAB — MAGNESIUM: Magnesium: 2.1 mg/dL (ref 1.7–2.4)

## 2019-03-25 LAB — FIBRIN DERIVATIVES D-DIMER (ARMC ONLY): Fibrin derivatives D-dimer (ARMC): 763.14 ng/mL (FEU) — ABNORMAL HIGH (ref 0.00–499.00)

## 2019-03-25 LAB — C-REACTIVE PROTEIN: CRP: 0.8 mg/dL (ref <1.0)

## 2019-03-25 LAB — SEDIMENTATION RATE: Sed Rate: 11 mm/hr (ref 0–30)

## 2019-03-25 LAB — FIBRINOGEN: Fibrinogen: 361 mg/dL (ref 210–475)

## 2019-03-25 LAB — LACTATE DEHYDROGENASE: LDH: 314 U/L — ABNORMAL HIGH (ref 98–192)

## 2019-03-25 MED ORDER — HYDROCOD POLST-CPM POLST ER 10-8 MG/5ML PO SUER: 5.0000 mL | Freq: Two times a day (BID) | ORAL | Status: DC | PRN

## 2019-03-25 MED ORDER — MORPHINE SULFATE (PF) 2 MG/ML IV SOLN: 2.0000 mg | INTRAVENOUS | Status: DC | PRN

## 2019-03-25 MED ORDER — ALBUTEROL SULFATE (2.5 MG/3ML) 0.083% IN NEBU: 2.5000 mg | INHALATION_SOLUTION | Freq: Four times a day (QID) | RESPIRATORY_TRACT | Status: DC | PRN

## 2019-03-25 MED ORDER — GUAIFENESIN ER 600 MG PO TB12
600.0000 mg | ORAL_TABLET | Freq: Two times a day (BID) | ORAL | Status: DC
  Administered 2019-03-25 – 2019-03-28 (×7): 600 mg via ORAL
  Filled 2019-03-25 (×8): qty 1

## 2019-03-25 MED ORDER — FUROSEMIDE 10 MG/ML IJ SOLN
40.0000 mg | Freq: Once | INTRAMUSCULAR | Status: AC
  Administered 2019-03-25: 19:00:00 40 mg via INTRAVENOUS
  Filled 2019-03-25: qty 4

## 2019-03-25 NOTE — ED Notes (Signed)
Patient is resting comfortably. 

## 2019-03-25 NOTE — ED Notes (Signed)
Patient provided with juice, got up to restroom with assistance

## 2019-03-25 NOTE — Progress Notes (Signed)
PROGRESS NOTE    Katelyn Lane  XNT:[id number] DOB: [birth year] DOA: 03/24/2019 PCP: [name], MD    Assessment & Plan:   Active Problems:   Acute on chronic combined systolic and diastolic CHF (congestive heart failure) ([facility])    Katelyn Lane  is a 64 y.o. female with a known history of combined systolic and diastolic CHF as well as HQPRF-16 with recent admission last month at [facility] for acute respiratory failure secondary to both COVID-19 pneumonia and acute on chronic CHF followed by an admission here for the same from 2/26-3/2.  She presents emergency room with acute onset of worsening dyspnea and orthopnea with paroxysmal nocturnal dyspnea without worsening lower extremity edema.  She admitted to wheezing and excessive cough with epigastric and periumbilical abdominal pain.     1.  Acute on chronic combined systolic and diastolic CHF with EF of less than 20% and grade 3 diastolic dysfunction with subsequent acute hypoxic respiratory failure with history of recent COVID-19 last month with residual lung findings.  She has a history of left ventricular mural thrombus, on Xarelto. -IV Lasix 40 mg x2 today --hold Entresto to allow room for diuresis --continue Aldactone as well as Coreg  2.  Hypertensive urgency, resolved -This could be contributing to her acute CHF. -She will be placed on as needed IV labetalol.  3.  Elevated troponin I. -Has had elevated troponin last month as well. -We will follow serial troponin I. -This likely demand ischemia with her acute CHF.  4.  Abdominal pain. -Abdominal and pelvic CT scan showed no acute findings. -This could be musculoskeletal due to excessive cough. -The patient will be placed on as needed Tussionex and scheduled Mucinex. -Pain management will be provided.  5.  Dyslipidemia. -We will continue statin therapy.  6.  Depression. -We will continue Prozac.  # Cocaine positive   DVT prophylaxis: BW:[id number]  Code  Status: Full code  Family Communication:  Disposition Plan: Home in 1-2 days   Subjective and Interval History:  Pt complained of abdominal pain, and that stuff she ate went right through her.  No fever, chest pain, N/V, dysuria.  Pt was off O2, and walked to the bathroom unassisted.   Objective: Vitals:   03/25/19 0900 03/25/19 1200 03/25/19 1300 03/25/19 1900  BP: (!) 126/94 115/78 112/80 (!) 109/97  Pulse: 72 67 (!) 54 68  Resp: 18 19  18   Temp:      TempSrc:      SpO2: 96% 98% (!) 85% 98%  Weight:      Height:        Intake/Output Summary (Last 24 hours) at 03/25/2019 1959 Last data filed at 03/25/2019 0417 Gross per 24 hour  Intake 320 ml  Output 1100 ml  Net -780 ml   Filed Weights   03/24/19 1353  Weight: 97.5 kg    Examination:   Constitutional: NAD, AAOx3 HEENT: conjunctivae and lids normal, EOMI CV: RRR no M,R,G. Distal pulses +2.  No cyanosis.   RESP: CTA B/L, normal respiratory effort, on RA GI: +BS, ND, soft, reported pain with palpation Extremities: No effusions, edema, or tenderness in BLE MSK: normal ROM and strength, no joint enlargement or tenderness of both UE and LE SKIN: warm, dry and intact Neuro: II - XII grossly intact.  Sensation intact    Data Reviewed: I have personally reviewed following labs and imaging studies  CBC: Recent Labs  Lab 03/24/19 1407 03/24/19 1752 03/25/19 0409  WBC 3.5* 3.3* 4.3  NEUTROABS  --  1.6* 1.5*  HGB 11.2* 11.3* 11.0*  HCT 38.1 38.4 37.2  MCV 82.8 82.8 82.5  PLT 317 313 353   Basic Metabolic Panel: Recent Labs  Lab 03/24/19 1407 03/25/19 0023 03/25/19 0409  NA 139  --  142  K 4.8  --  4.6  CL 111  --  112*  CO2 20*  --  25  GLUCOSE 122*  --  107*  BUN 28*  --  31*  CREATININE 1.97*  --  2.11*  CALCIUM 9.0  --  8.9  MG  --  2.1  --    GFR: Estimated Creatinine Clearance: 32.1 mL/min (A) (by C-G formula based on SCr of 2.11 mg/dL (H)). Liver Function Tests: Recent Labs  Lab  03/24/19 1407  AST 38  ALT 30  ALKPHOS 107  BILITOT 1.0  PROT 6.3*  ALBUMIN 3.2*   Recent Labs  Lab 03/24/19 1407  LIPASE 37   No results for input(s): AMMONIA in the last 168 hours. Coagulation Profile: No results for input(s): INR, PROTIME in the last 168 hours. Cardiac Enzymes: No results for input(s): CKTOTAL, CKMB, CKMBINDEX, TROPONINI in the last 168 hours. BNP (last 3 results) No results for input(s): PROBNP in the last 8760 hours. HbA1C: No results for input(s): HGBA1C in the last 72 hours. CBG: No results for input(s): GLUCAP in the last 168 hours. Lipid Profile: No results for input(s): CHOL, HDL, LDLCALC, TRIG, CHOLHDL, LDLDIRECT in the last 72 hours. Thyroid Function Tests: No results for input(s): TSH, T4TOTAL, FREET4, T3FREE, THYROIDAB in the last 72 hours. Anemia Panel: Recent Labs    03/25/19 0023  FERRITIN 40   Sepsis Labs: No results for input(s): PROCALCITON, LATICACIDVEN in the last 168 hours.  No results found for this or any previous visit (from the past 240 hour(s)).    Radiology Studies: CT ABDOMEN PELVIS WO CONTRAST  Result Date: 03/24/2019 CLINICAL DATA:  Abdominal pain. EXAM: CT ABDOMEN AND PELVIS WITHOUT CONTRAST TECHNIQUE: Multidetector CT imaging of the abdomen and pelvis was performed following the standard protocol without IV contrast. COMPARISON:  02/13/2019 FINDINGS: Lower chest: There are ground-glass airspace opacities at the lung bases medially, greatest at the right lung base.The heart is enlarged. Hepatobiliary: The liver is normal. Normal gallbladder.There is no biliary ductal dilation. Pancreas: Normal contours without ductal dilatation. No peripancreatic fluid collection. Spleen: No splenic laceration or hematoma. Adrenals/Urinary Tract: --Adrenal glands: No adrenal hemorrhage. --Right kidney/ureter: No hydronephrosis or perinephric hematoma. --Left kidney/ureter: No hydronephrosis or perinephric hematoma. --Urinary bladder:  Unremarkable. Stomach/Bowel: --Stomach/Duodenum: No hiatal hernia or other gastric abnormality. Normal duodenal course and caliber. --Small bowel: No dilatation or inflammation. --Colon: Rectosigmoid diverticulosis without acute inflammation. --Appendix: Normal. Vascular/Lymphatic: Atherosclerotic calcification is present within the non-aneurysmal abdominal aorta, without hemodynamically significant stenosis. --No retroperitoneal lymphadenopathy. --No mesenteric lymphadenopathy. --No pelvic or inguinal lymphadenopathy. Reproductive: Status post hysterectomy. No adnexal mass. Other: There is a small amount of free fluid in the upper abdomen. The abdominal wall is normal. Musculoskeletal. No acute displaced fractures. IMPRESSION: 1. There are ground-glass airspace opacities at the lung bases medially, greatest at the right lung base. Differential considerations include infection and inflammation. 2. Rectosigmoid diverticulosis without evidence of acute diverticulitis. 3. Small amount of free fluid in the upper abdomen. 4. Aortic Atherosclerosis (ICD10-I70.0). Electronically Signed   By: Constance Holster M.D.   On: 03/24/2019 18:33   DG Chest 2 View  Result Date: 03/24/2019 CLINICAL DATA:  64 year old female with history of shortness  of breath. Tested positive for COVID 19 a week or 2 ago. EXAM: CHEST - 2 VIEW COMPARISON:  Chest x-ray 03/18/2019. FINDINGS: Ill-defined opacities and areas of interstitial prominence noted throughout the mid to lower lungs bilaterally, suggesting mild multilobar bilateral pneumonia. No pleural effusions. No evidence of pulmonary edema. Mild to moderate cardiomegaly. Upper mediastinal contours are within normal limits. Aortic atherosclerosis. IMPRESSION: 1. The appearance of the chest is compatible with mild multilobar pneumonia, with a spectrum of findings commonly seen in the setting of COVID-19 infection. 2. Mild to moderate cardiomegaly. 3. Aortic atherosclerosis. Electronically  Signed   By: Vinnie Langton M.D.   On: 03/24/2019 15:15     Scheduled Meds: . ascorbic acid  500 mg Oral Daily  . atorvastatin  20 mg Oral q1800  . carvedilol  3.125 mg Oral BID WC  . FLUoxetine  40 mg Oral Daily  . guaiFENesin  600 mg Oral BID  . polyethylene glycol  17 g Oral Daily  . rivaroxaban  20 mg Oral Q supper  . sodium chloride flush  3 mL Intravenous Once  . sodium chloride flush  3 mL Intravenous Q12H  . spironolactone  12.5 mg Oral Daily  . zinc sulfate  220 mg Oral Daily   Continuous Infusions: . sodium chloride       LOS: 1 day     Enzo Bi, MD [facility] If 7PM-7AM, please contact night-coverage 03/25/2019, 7:59 PM

## 2019-03-25 NOTE — ED Notes (Signed)
Patient resting comfortably in bed, given night meds and some water. Patient wants to try to sleep. Call bell is at bedside, notified to call for any needs or bathroom. Patient indicated understanding

## 2019-03-25 NOTE — ED Notes (Signed)
Pt walked to bedside toilet at this time visualized by this nurse. Pt tolerated well, did not need assistance.

## 2019-03-25 NOTE — ED Notes (Signed)
Patient asleep, no complaints at present. Call bell within reach. NAD, vital signs stable. 

## 2019-03-25 NOTE — ED Notes (Signed)
This RN went into pt room to answer call bell, pt informed RN that she was on the phone and not to interrupt her.  Pt has also been expressing upset that there is no restroom in her treatment room, this RN attempted to show pt where restroom is across the hall

## 2019-03-25 NOTE — TOC Initial Note (Signed)
Transition of Care [facility]) - Initial/Assessment Note    Patient Details  Name: Katelyn Lane MRN: [id number] Date of Birth: 1955/09/05  Transition of Care [facility]) CM/SW Contact:    Ova Freshwater Phone Number: [phone] 03/25/2019, 11:40 AM  Clinical Narrative:                 Patient case to the ED 03/24/2019, stating "I am was not feeling well."  Patient states her medication was making her feel nauseous and she decided not to take them last night.  This SW asked the patient if she was taking her medication regularly and she confirmed that she was but didn't last night. Patient was mildly agitated and upset about not receiving her breakfast tray, and was focused on that and had trouble following the conversation.  Patient's speech was mildly circumstantial but was oriented x3.   Patient is does not want to go to a SNF and would like to go home.  Patient states she is able to perform all for her ADLs and states she does not have any family support.    Expected Discharge Plan: [place]     Patient Goals and CMS Choice Patient states their goals for this hospitalization and ongoing recovery are:: Patient does not want to go to a SNF, and would like to receive home health. "I don't want to go to no nursing home."      Expected Discharge Plan and Services Expected Discharge Plan: [place] In-house Referral: Clinical Social Work     Living arrangements for the past 2 months: Single Family Home                                      Prior Living Arrangements/Services Living arrangements for the past 2 months: Single Family Home Lives with:: Self Patient language and need for interpreter reviewed:: Yes Do you feel safe going back to the place where you live?: Yes            Criminal Activity/Legal Involvement Pertinent to Current Situation/Hospitalization: No - Comment as needed  Activities of Daily Living      Permission  Sought/Granted                  Emotional Assessment Appearance:: Appears stated age Attitude/Demeanor/Rapport: Complaining(Patient compalined about not receiving breakfast and was fixated on reason for being in the ED.) Affect (typically observed): Apprehensive, Irritable(Patient's is adamant and unwilling to go to a SNF.) Orientation: : Oriented to Self, Oriented to Place, Oriented to Situation      Admission diagnosis:  Acute on chronic combined systolic and diastolic CHF (congestive heart failure) ([place]) [I50.43] Patient Active Problem List   Diagnosis Date Noted  . Acute on chronic combined systolic and diastolic CHF (congestive heart failure) ([place]) 03/24/2019  . COVID-19 03/06/2019  . Acute respiratory failure with hypoxia ([place]) 03/06/2019  . Hypokalemia 03/06/2019  . LV (left ventricular) mural thrombus 03/06/2019  . CKD (chronic kidney disease), stage IIIa 03/06/2019  . Nausea and vomiting 02/16/2019  . Abdominal pain 02/16/2019  . Rhabdomyolysis 02/16/2019  . Acute renal failure superimposed on stage 3 chronic kidney disease ([place]) 02/16/2019  . Cocaine use 02/16/2019  . Tobacco abuse 02/16/2019  . Acute on chronic systolic CHF (congestive heart failure) ([place]) 02/16/2019  . Acute congestive heart failure ([place]) 02/13/2019  . Respiratory failure with hypoxia ([place]) 01/31/2019  .  Stroke ([place]) 01/18/2019  . Dilated cardiomyopathy ([place]) 12/31/2018  . Chest tightness 12/31/2018  . Elevated troponin 12/31/2018  . Hypertensive urgency 12/31/2018  . Dyslipidemia 12/31/2018  . Depression 12/31/2018  . HFrEF (heart failure with reduced ejection fraction) ([place]) 12/29/2018   PCP:  [name], MD Pharmacy:   [place], [place] - [address] [place] [address] Phone: [phone] Fax: [phone]  [facility] [address] AT [address] [place]  [address] Phone: [phone] Fax: [phone]     Social Determinants of Health (SDOH) Interventions    Readmission Risk Interventions Readmission Risk Prevention Plan 03/09/2019  Transportation Screening Complete  Medication Review (RN Care Manager) Referral to Pharmacy  PCP or Specialist appointment within 3-5 days of discharge Complete  Palliative Care Screening Not [place] Not Applicable  Some recent data might be hidden

## 2019-03-25 NOTE — ED Notes (Signed)
Per EDP, patient does not need to be reswabbed prior to admission

## 2019-03-25 NOTE — Consult Note (Signed)
CARDIOLOGY CONSULT NOTE               Patient ID: Katelyn Lane MRN: [id number] DOB/AGE: 1955/09/15 64 y.o.  Admit date: 03/24/2019 Referring Physician Dr. Eugenie Norrie  Primary Physician Dr. 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 Workup in the ED has been significant for BNP of 4174, high sensitivity troponin 75, 93 respectively, chest xray revealing multilobar pneumonia, consistent with recent COVID-19 infection, and ECG revealing normal sinus rhythm with wide QRS complexes - no significant change from previous ECG from recent admission.  She has missed her follow up outpatient cardiology appointments and reports medical noncompliance over the past few days.    She reports improvement in shortness of breath, but continues to experience orthopnea and PND.  She denies chest pain or chest pressure.  She denies syncopal/presyncopal episodes.   Review of systems complete and found to be negative unless listed above     Past Medical History:  Diagnosis Date   CHF (congestive heart failure) ([place])    Hyperlipidemia    Renal disorder     Past Surgical History:  Procedure Laterality Date   RIGHT/LEFT HEART CATH AND CORONARY ANGIOGRAPHY N/A 12/30/2018   Procedure: RIGHT/LEFT HEART CATH AND CORONARY ANGIOGRAPHY;  Surgeon: Corey Skains, MD;  Location: [facility];  Service: Cardiovascular;  Laterality: N/A;    (Not in a hospital admission)  Social History   Socioeconomic History   Marital status: Single    Spouse name: Not on file    Number of children: Not on file   Years of education: Not on file   Highest education level: Not on file  Occupational History   Not on file  Tobacco Use   Smoking status: Current Every Day Smoker   Smokeless tobacco: Never Used  Substance and Sexual Activity   Alcohol use: No    Alcohol/week: 0.0 standard drinks   Drug use: Yes    Types: Cocaine    Comment: 4-5 days ago   Sexual activity: Not on file  Other Topics Concern   Not on file  Social History Narrative   Not on file   Social Determinants of Health   Financial Resource Strain:    Difficulty of Paying Living Expenses:   Food Insecurity:    Worried About Charity fundraiser in the Last Year:    Arboriculturist in the Last Year:   Transportation Needs:    Film/video editor (Medical):    Lack of Transportation (Non-Medical):   Physical Activity:    Days of Exercise per Week:    Minutes of Exercise per Session:   Stress:    Feeling of Stress :   Social Connections:    Frequency of Communication with Friends and Family:    Frequency of Social Gatherings with Friends and Family:    Attends Religious Services:    Active Member of Clubs or Organizations:    Attends Archivist Meetings:    Marital Status:   Intimate Partner Violence:    Fear of Current or Ex-Partner:  Emotionally Abused:    Physically Abused:    Sexually Abused:     Family History  Problem Relation Age of Onset   Breast cancer Neg Hx       Review of systems complete and found to be negative unless listed above      PHYSICAL EXAM  General: Well developed, well nourished, in no acute distress HEENT:  Normocephalic and atramatic Neck:  No JVD.  Lungs: Clear bilaterally to auscultation and percussion. Heart: HRRR . Normal S1 and S2 without gallops or murmurs.  Abdomen: Bowel sounds are positive, abdomen soft and non-tender  Msk:  Back normal. Normal strength and tone for age. Extremities: No  clubbing, cyanosis or edema.   Neuro: Alert and oriented X 3. Psych:  Good affect, responds appropriately  Labs:   Lab Results  Component Value Date   WBC 4.3 03/25/2019   HGB 11.0 (L) 03/25/2019   HCT 37.2 03/25/2019   MCV 82.5 03/25/2019   PLT 272 03/25/2019    Recent Labs  Lab 03/24/19 1407 03/24/19 1407 03/25/19 0409  NA 139   < > 142  K 4.8   < > 4.6  CL 111   < > 112*  CO2 20*   < > 25  BUN 28*   < > 31*  CREATININE 1.97*   < > 2.11*  CALCIUM 9.0   < > 8.9  PROT 6.3*  --   --   BILITOT 1.0  --   --   ALKPHOS 107  --   --   ALT 30  --   --   AST 38  --   --   GLUCOSE 122*   < > 107*   < > = values in this interval not displayed.   No results found for: CKTOTAL, CKMB, CKMBINDEX, TROPONINI  Lab Results  Component Value Date   CHOL 126 03/07/2019   CHOL 228 (H) 01/18/2019   Lab Results  Component Value Date   HDL 32 (L) 03/07/2019   HDL 46 01/18/2019   Lab Results  Component Value Date   LDLCALC 50 03/07/2019   LDLCALC 134 (H) 01/18/2019   Lab Results  Component Value Date   TRIG 222 (H) 03/07/2019   TRIG 232 (H) 03/07/2019   TRIG 242 (H) 01/18/2019   Lab Results  Component Value Date   CHOLHDL 3.9 03/07/2019   CHOLHDL 5.0 01/18/2019   No results found for: LDLDIRECT    Radiology: CT ABDOMEN PELVIS WO CONTRAST  Result Date: 03/24/2019 CLINICAL DATA:  Abdominal pain. EXAM: CT ABDOMEN AND PELVIS WITHOUT CONTRAST TECHNIQUE: Multidetector CT imaging of the abdomen and pelvis was performed following the standard protocol without IV contrast. COMPARISON:  02/13/2019 FINDINGS: Lower chest: There are ground-glass airspace opacities at the lung bases medially, greatest at the right lung base.The heart is enlarged. Hepatobiliary: The liver is normal. Normal gallbladder.There is no biliary ductal dilation. Pancreas: Normal contours without ductal dilatation. No peripancreatic fluid collection. Spleen: No splenic laceration or hematoma. Adrenals/Urinary Tract:  --Adrenal glands: No adrenal hemorrhage. --Right kidney/ureter: No hydronephrosis or perinephric hematoma. --Left kidney/ureter: No hydronephrosis or perinephric hematoma. --Urinary bladder: Unremarkable. Stomach/Bowel: --Stomach/Duodenum: No hiatal hernia or other gastric abnormality. Normal duodenal course and caliber. --Small bowel: No dilatation or inflammation. --Colon: Rectosigmoid diverticulosis without acute inflammation. --Appendix: Normal. Vascular/Lymphatic: Atherosclerotic calcification is present within the non-aneurysmal abdominal aorta, without hemodynamically significant stenosis. --No retroperitoneal lymphadenopathy. --No mesenteric lymphadenopathy. --No pelvic or inguinal lymphadenopathy. Reproductive: Status post hysterectomy. No  adnexal mass. Other: There is a small amount of free fluid in the upper abdomen. The abdominal wall is normal. Musculoskeletal. No acute displaced fractures. IMPRESSION: 1. There are ground-glass airspace opacities at the lung bases medially, greatest at the right lung base. Differential considerations include infection and inflammation. 2. Rectosigmoid diverticulosis without evidence of acute diverticulitis. 3. Small amount of free fluid in the upper abdomen. 4. Aortic Atherosclerosis (ICD10-I70.0). Electronically Signed   By: Constance Holster M.D.   On: 03/24/2019 18:33   DG Chest 2 View  Result Date: 03/24/2019 CLINICAL DATA:  64 year old female with history of shortness of breath. Tested positive for COVID 19 a week or 2 ago. EXAM: CHEST - 2 VIEW COMPARISON:  Chest x-ray 03/18/2019. FINDINGS: Ill-defined opacities and areas of interstitial prominence noted throughout the mid to lower lungs bilaterally, suggesting mild multilobar bilateral pneumonia. No pleural effusions. No evidence of pulmonary edema. Mild to moderate cardiomegaly. Upper mediastinal contours are within normal limits. Aortic atherosclerosis. IMPRESSION: 1. The appearance of the chest is  compatible with mild multilobar pneumonia, with a spectrum of findings commonly seen in the setting of COVID-19 infection. 2. Mild to moderate cardiomegaly. 3. Aortic atherosclerosis. Electronically Signed   By: Vinnie Langton M.D.   On: 03/24/2019 15:15   DG Chest 2 View  Result Date: 03/18/2019 CLINICAL DATA:  Shortness of breath EXAM: CHEST - 2 VIEW COMPARISON:  03/06/2019 FINDINGS: The heart size remains enlarged. Chronic bronchitic changes are again noted bilaterally. There is no large focal infiltrate. No pneumothorax. Subtle peripheral airspace opacities are again noted but are improved from prior study. There may be some vascular congestion without overt pulmonary edema. IMPRESSION: 1. Subtle but improved bilateral airspace opacities. 2. Cardiomegaly with vascular congestion. Electronically Signed   By: Constance Holster M.D.   On: 03/18/2019 03:36   DG Chest Portable 1 View  Result Date: 03/06/2019 CLINICAL DATA:  Shortness of breath. COVID positive recently discharged from [facility]. Also with history of CHF. EXAM: PORTABLE CHEST 1 VIEW COMPARISON:  02/13/2019 FINDINGS: Cardiomediastinal contours are enlarged. Central vascular structures mildly engorged. Subtle peripheral opacities are seen in the left mid chest, right mid chest and right lower lobe. No signs of effusion or dense consolidation. No acute bone finding. IMPRESSION: Subtle peripheral opacities in the left mid chest, right mid chest and right lower lobe. Findings could be seen in atypical or viral pneumonia. Increased interstitial markings may be due to above process or background pulmonary edema. Cardiomegaly as before. Electronically Signed   By: Zetta Bills M.D.   On: 03/06/2019 08:25    EKG: Sinus rhythm   ASSESSMENT AND PLAN:   1.  Acute on chronic CHF  -With history of medical noncompliance; patient has missed all follow up outpatient cardiology appointments   -Most recent echocardiogram in January 2021 revealed severely  reduced LV systolic function with an EF estimated less than 20% with a large, fixed thrombus on the lateral wall of the left ventricle; left ventricle and left and right atria were severely dilated   -Stressed the importance of carvedilol 3.125mg  BID, Entresto, spironolactone 25mg  daily, and Lasix 40mg  daily   -Will need close outpatient follow up  2.  Elevated troponin  -Likely demand ischemia in the absence of chest pain; no evidence of obstructive disease per catheterization in 2020   -No further ischemic workup indicated at this time   3.  Coronary artery disease    -Minimal, non-obstructive per left heart catheterization in December 2020  4.  Left ventricular thrombus   -Continue Xarelto 20mg  daily   The history, physical exam findings, and plan of care were all discussed with Dr. Bartholome Bill, and all decision making was made in collaboration.   Signed: Avie Arenas PA-C 03/25/2019, 8:23 AM

## 2019-03-25 NOTE — ED Notes (Signed)
Lab called to add-on LDH

## 2019-03-25 NOTE — ED Notes (Signed)
Reached out to admitting provider to inform that magnesium and potassium are WNL, had spoken with him in regards to one episode of unsustained vtach

## 2019-03-26 LAB — CBC
HCT: 35.8 % — ABNORMAL LOW (ref 36.0–46.0)
Hemoglobin: 10.4 g/dL — ABNORMAL LOW (ref 12.0–15.0)
MCH: 24.4 pg — ABNORMAL LOW (ref 26.0–34.0)
MCHC: 29.1 g/dL — ABNORMAL LOW (ref 30.0–36.0)
MCV: 83.8 fL (ref 80.0–100.0)
Platelets: 247 10*3/uL (ref 150–400)
RBC: 4.27 MIL/uL (ref 3.87–5.11)
RDW: 19.1 % — ABNORMAL HIGH (ref 11.5–15.5)
WBC: 3.8 10*3/uL — ABNORMAL LOW (ref 4.0–10.5)
nRBC: 0 % (ref 0.0–0.2)

## 2019-03-26 LAB — BASIC METABOLIC PANEL
Anion gap: 6 (ref 5–15)
BUN: 38 mg/dL — ABNORMAL HIGH (ref 8–23)
CO2: 27 mmol/L (ref 22–32)
Calcium: 8.7 mg/dL — ABNORMAL LOW (ref 8.9–10.3)
Chloride: 107 mmol/L (ref 98–111)
Creatinine, Ser: 2.16 mg/dL — ABNORMAL HIGH (ref 0.44–1.00)
GFR calc Af Amer: 27 mL/min — ABNORMAL LOW (ref >60)
GFR calc non Af Amer: 24 mL/min — ABNORMAL LOW (ref >60)
Glucose, Bld: 110 mg/dL — ABNORMAL HIGH (ref 70–99)
Potassium: 4.2 mmol/L (ref 3.5–5.1)
Sodium: 140 mmol/L (ref 135–145)

## 2019-03-26 LAB — MAGNESIUM: Magnesium: 1.9 mg/dL (ref 1.7–2.4)

## 2019-03-26 MED ORDER — FUROSEMIDE 10 MG/ML IJ SOLN
80.0000 mg | Freq: Once | INTRAMUSCULAR | Status: AC
  Administered 2019-03-26: 09:00:00 80 mg via INTRAVENOUS
  Filled 2019-03-26: qty 8

## 2019-03-26 NOTE — Progress Notes (Signed)
MD notified via secure chat. Pt has 14 beats of VT. No new orders at this time. I will continue to assess.

## 2019-03-26 NOTE — Progress Notes (Addendum)
PROGRESS NOTE    Katelyn Lane  WCH:[id number] DOB: [birth year] DOA: 03/24/2019 PCP: [name], MD    Assessment & Plan:   Active Problems:   Acute on chronic combined systolic and diastolic CHF (congestive heart failure) (HCC)    Katelyn Lane  is a 64 y.o. AA female with a known history of combined systolic and diastolic CHF as well as PNTIR-44 with recent admission last month at [facility] for acute respiratory failure secondary to both COVID-19 pneumonia and acute on chronic CHF followed by an admission here for the same from 2/26-3/2.  She presents emergency room with acute onset of worsening dyspnea and orthopnea with paroxysmal nocturnal dyspnea without worsening lower extremity edema.  She admitted to wheezing and excessive cough with epigastric and periumbilical abdominal pain.     # dyspnea and acute hypoxic respiratory failure 2/2 # Acute on chronic combined systolic and diastolic CHF with EF of less than 20% and grade 3 diastolic dysfunction  # history of recent COVID-19 last month with residual lung findings.   --With history of medical noncompliance and frequent hospitalizations; has missed all follow up outpatient cardiology appointments, per cards. --Cardiology consulted PLAN: -IV lasix 80 today --Strict I/O and daily standing weight --hold Entresto to allow room for diuresis --continue Aldactone as well as Coreg --Query ICD placement for this pt.  # Left ventricular mural thrombus, on Xarelto --continue home Xarelto  # CKD3 --baseline Cr around 2  # Hypertensive urgency, resolved -This could be contributing to her acute CHF.  # Elevated troponin I likely due to demand ischemia -Has had elevated troponin last month as well. -Likely demand ischemia in the absence of chest pain; no evidence of obstructive disease per catheterization in 2020  -No further ischemic workup indicated at this time, per cards.  # Abdominal pain, resolved -Abdominal and  pelvic CT scan showed no acute findings. -This could be musculoskeletal due to excessive cough. -The patient will be placed on as needed Tussionex and scheduled Mucinex.  # Dyslipidemia. -We will continue statin therapy.  # Depression. -We will continue Prozac.  # Cocaine positive --positive cocaine on every UDS, however, pt said she barely uses it, but did admit to using prior to presentation.   DVT prophylaxis: RX:[id number]  Code Status: Full code  Family Communication:  Disposition Plan: Home after 1-2 more days of IV diuresis, pending improvement in dyspnea.  Pt has severely reduced EF but CKD, so need aggressive diuresis but carefully.   Subjective and Interval History:  Pt reported feeling a little better, nausea a little better, urinated a lot.  Felt now constipated instead of having diarrhea.  Reported feeling dyspnea sometimes but resisted wearing O2.  No fever, dysuria.  Pt noted to have 14 beats of VT on tele.   Objective: Vitals:   03/26/19 0804 03/26/19 1000 03/26/19 1152 03/26/19 1803  BP: (!) 114/92  (!) 110/98 124/90  Pulse: 75  70 64  Resp: 19  19 19   Temp: 97.7 F (36.5 C)  97.9 F (36.6 C)   TempSrc: Oral  Oral   SpO2:   100% 100%  Weight:  98.1 kg    Height:        Intake/Output Summary (Last 24 hours) at 03/26/2019 1858 Last data filed at 03/26/2019 1815 Gross per 24 hour  Intake 1320 ml  Output 2800 ml  Net -1480 ml   Filed Weights   03/24/19 1353 03/26/19 0002 03/26/19 1000  Weight: 97.5 kg 98.1 kg 98.1  kg    Examination:   Constitutional: NAD, AAOx3 HEENT: conjunctivae and lids normal, EOMI CV: RRR no M,R,G. Distal pulses +2.  No cyanosis.   RESP: reduced breath sounds, normal respiratory effort, on RA GI: +BS, ND, soft, NT today Extremities: No effusions, edema, or tenderness in BLE MSK: normal ROM and strength, no joint enlargement or tenderness of both UE and LE SKIN: warm, dry and intact Neuro: II - XII grossly intact.   Sensation intact    Data Reviewed: I have personally reviewed following labs and imaging studies  CBC: Recent Labs  Lab 03/24/19 1407 03/24/19 1752 03/25/19 0409 03/26/19 0428  WBC 3.5* 3.3* 4.3 3.8*  NEUTROABS  --  1.6* 1.5*  --   HGB 11.2* 11.3* 11.0* 10.4*  HCT 38.1 38.4 37.2 35.8*  MCV 82.8 82.8 82.5 83.8  PLT 317 313 272 381   Basic Metabolic Panel: Recent Labs  Lab 03/24/19 1407 03/25/19 0023 03/25/19 0409 03/26/19 0428  NA 139  --  142 140  K 4.8  --  4.6 4.2  CL 111  --  112* 107  CO2 20*  --  25 27  GLUCOSE 122*  --  107* 110*  BUN 28*  --  31* 38*  CREATININE 1.97*  --  2.11* 2.16*  CALCIUM 9.0  --  8.9 8.7*  MG  --  2.1  --  1.9   GFR: Estimated Creatinine Clearance: 33.2 mL/min (A) (by C-G formula based on SCr of 2.16 mg/dL (H)). Liver Function Tests: Recent Labs  Lab 03/24/19 1407  AST 38  ALT 30  ALKPHOS 107  BILITOT 1.0  PROT 6.3*  ALBUMIN 3.2*   Recent Labs  Lab 03/24/19 1407  LIPASE 37   No results for input(s): AMMONIA in the last 168 hours. Coagulation Profile: No results for input(s): INR, PROTIME in the last 168 hours. Cardiac Enzymes: No results for input(s): CKTOTAL, CKMB, CKMBINDEX, TROPONINI in the last 168 hours. BNP (last 3 results) No results for input(s): PROBNP in the last 8760 hours. HbA1C: No results for input(s): HGBA1C in the last 72 hours. CBG: No results for input(s): GLUCAP in the last 168 hours. Lipid Profile: No results for input(s): CHOL, HDL, LDLCALC, TRIG, CHOLHDL, LDLDIRECT in the last 72 hours. Thyroid Function Tests: No results for input(s): TSH, T4TOTAL, FREET4, T3FREE, THYROIDAB in the last 72 hours. Anemia Panel: Recent Labs    03/25/19 0023  FERRITIN 40   Sepsis Labs: No results for input(s): PROCALCITON, LATICACIDVEN in the last 168 hours.  No results found for this or any previous visit (from the past 240 hour(s)).    Radiology Studies: No results found.   Scheduled Meds: .  ascorbic acid  500 mg Oral Daily  . atorvastatin  20 mg Oral q1800  . carvedilol  3.125 mg Oral BID WC  . FLUoxetine  40 mg Oral Daily  . guaiFENesin  600 mg Oral BID  . polyethylene glycol  17 g Oral Daily  . rivaroxaban  20 mg Oral Q supper  . sodium chloride flush  3 mL Intravenous Once  . sodium chloride flush  3 mL Intravenous Q12H  . spironolactone  12.5 mg Oral Daily  . zinc sulfate  220 mg Oral Daily   Continuous Infusions: . sodium chloride       LOS: 2 days     Enzo Bi, MD [facility] If 7PM-7AM, please contact night-coverage 03/26/2019, 6:58 PM

## 2019-03-26 NOTE — Progress Notes (Signed)
[facility] Cardiology    SUBJECTIVE: [name] is a 64 year old female with a past medical history significant for combined systolic and diastolic CHF, recent GLOVF-64 infection, history of a left ventricular mural thrombus, anticoagulated on Xarelto, cocaine abuse, hypertension, and hyperlipidemia who presented to the ED on 03/24/19 for an acute onset of shortness of breath, orthopnea, and PND as well as nausea/vomiting. Workup in the ED has been significant for BNP of 4174, high sensitivity troponin 75, 93 respectively, chest xray revealing multilobar pneumonia, consistent with recent COVID-19 infection, and ECG revealing normal sinus rhythm with wide QRS complexes - no significant change from previous ECG from recent admission.  She has missed her follow up outpatient cardiology appointments and reports medical noncompliance over the past few days.    She reports mild improvement in shortness of breath. Continues to deny chest pain or chest pressure.  Denies lower extremity swelling, orthopnea, or PND.  Denies syncopal/presyncopal episodes.    Vitals:   03/26/19 0002 03/26/19 0441 03/26/19 0804 03/26/19 1000  BP: (!) 147/88 (!) 136/93 (!) 114/92   Pulse: 73 72 75   Resp: 15 20 19    Temp: 98.1 F (36.7 C) 97.8 F (36.6 C) 97.7 F (36.5 C)   TempSrc: Oral Oral Oral   SpO2: 99% 99%    Weight: 98.1 kg   98.1 kg  Height: 5\' 9"  (1.753 m)        Intake/Output Summary (Last 24 hours) at 03/26/2019 1123 Last data filed at 03/26/2019 0950 Gross per 24 hour  Intake 600 ml  Output 500 ml  Net 100 ml      PHYSICAL EXAM  General: Well developed, well nourished, in no acute distress HEENT:  Normocephalic and atramatic Neck:  No JVD.  Lungs: Clear bilaterally to auscultation and percussion. Heart: HRRR . Normal S1 and S2 without gallops or murmurs.  Abdomen: Bowel sounds are positive, abdomen soft and non-tender  Msk:  Back normal, normal gait. Normal strength and tone for age. Extremities: No  clubbing, cyanosis or edema.   Neuro: Alert and oriented X 3. Psych:  Good affect, responds appropriately   LABS: Basic Metabolic Panel: Recent Labs    03/24/19 1407 03/25/19 0023 03/25/19 0409 03/26/19 0428  NA   < >  --  142 140  K   < >  --  4.6 4.2  CL   < >  --  112* 107  CO2   < >  --  25 27  GLUCOSE   < >  --  107* 110*  BUN   < >  --  31* 38*  CREATININE   < >  --  2.11* 2.16*  CALCIUM   < >  --  8.9 8.7*  MG  --  2.1  --  1.9   < > = values in this interval not displayed.   Liver Function Tests: Recent Labs    03/24/19 1407  AST 38  ALT 30  ALKPHOS 107  BILITOT 1.0  PROT 6.3*  ALBUMIN 3.2*   Recent Labs    03/24/19 1407  LIPASE 37   CBC: Recent Labs    03/24/19 1752 03/24/19 1752 03/25/19 0409 03/26/19 0428  WBC 3.3*   < > 4.3 3.8*  NEUTROABS 1.6*  --  1.5*  --   HGB 11.3*   < > 11.0* 10.4*  HCT 38.4   < > 37.2 35.8*  MCV 82.8   < > 82.5 83.8  PLT 313   < >  272 247   < > = values in this interval not displayed.   Cardiac Enzymes: No results for input(s): CKTOTAL, CKMB, CKMBINDEX, TROPONINI in the last 72 hours. BNP: Invalid input(s): POCBNP D-Dimer: No results for input(s): DDIMER in the last 72 hours. Hemoglobin A1C: No results for input(s): HGBA1C in the last 72 hours. Fasting Lipid Panel: No results for input(s): CHOL, HDL, LDLCALC, TRIG, CHOLHDL, LDLDIRECT in the last 72 hours. Thyroid Function Tests: No results for input(s): TSH, T4TOTAL, T3FREE, THYROIDAB in the last 72 hours.  Invalid input(s): FREET3 Anemia Panel: Recent Labs    03/25/19 0023  FERRITIN 40    CT ABDOMEN PELVIS WO CONTRAST  Result Date: 03/24/2019 CLINICAL DATA:  Abdominal pain. EXAM: CT ABDOMEN AND PELVIS WITHOUT CONTRAST TECHNIQUE: Multidetector CT imaging of the abdomen and pelvis was performed following the standard protocol without IV contrast. COMPARISON:  02/13/2019 FINDINGS: Lower chest: There are ground-glass airspace opacities at the lung bases  medially, greatest at the right lung base.The heart is enlarged. Hepatobiliary: The liver is normal. Normal gallbladder.There is no biliary ductal dilation. Pancreas: Normal contours without ductal dilatation. No peripancreatic fluid collection. Spleen: No splenic laceration or hematoma. Adrenals/Urinary Tract: --Adrenal glands: No adrenal hemorrhage. --Right kidney/ureter: No hydronephrosis or perinephric hematoma. --Left kidney/ureter: No hydronephrosis or perinephric hematoma. --Urinary bladder: Unremarkable. Stomach/Bowel: --Stomach/Duodenum: No hiatal hernia or other gastric abnormality. Normal duodenal course and caliber. --Small bowel: No dilatation or inflammation. --Colon: Rectosigmoid diverticulosis without acute inflammation. --Appendix: Normal. Vascular/Lymphatic: Atherosclerotic calcification is present within the non-aneurysmal abdominal aorta, without hemodynamically significant stenosis. --No retroperitoneal lymphadenopathy. --No mesenteric lymphadenopathy. --No pelvic or inguinal lymphadenopathy. Reproductive: Status post hysterectomy. No adnexal mass. Other: There is a small amount of free fluid in the upper abdomen. The abdominal wall is normal. Musculoskeletal. No acute displaced fractures. IMPRESSION: 1. There are ground-glass airspace opacities at the lung bases medially, greatest at the right lung base. Differential considerations include infection and inflammation. 2. Rectosigmoid diverticulosis without evidence of acute diverticulitis. 3. Small amount of free fluid in the upper abdomen. 4. Aortic Atherosclerosis (ICD10-I70.0). Electronically Signed   By: Constance Holster M.D.   On: 03/24/2019 18:33   DG Chest 2 View  Result Date: 03/24/2019 CLINICAL DATA:  64 year old female with history of shortness of breath. Tested positive for COVID 19 a week or 2 ago. EXAM: CHEST - 2 VIEW COMPARISON:  Chest x-ray 03/18/2019. FINDINGS: Ill-defined opacities and areas of interstitial prominence  noted throughout the mid to lower lungs bilaterally, suggesting mild multilobar bilateral pneumonia. No pleural effusions. No evidence of pulmonary edema. Mild to moderate cardiomegaly. Upper mediastinal contours are within normal limits. Aortic atherosclerosis. IMPRESSION: 1. The appearance of the chest is compatible with mild multilobar pneumonia, with a spectrum of findings commonly seen in the setting of COVID-19 infection. 2. Mild to moderate cardiomegaly. 3. Aortic atherosclerosis. Electronically Signed   By: Vinnie Langton M.D.   On: 03/24/2019 15:15    ASSESSMENT AND PLAN:  1.  Acute on chronic HFrEF   -With history of medical noncompliance and frequent hospitalizations; has missed all follow up outpatient cardiology appointments    -Most recent echocardiogram in January 2021 revealed severely reduced LV systolic function with an EF estimated less than 20% with a large, fixed thrombus on the lateral wall of the left ventricle; left ventricle and left and right atria were severely dilated   -Continue carvedilol 3.125mg  BID, spironolactone 12.5mg  daily, and Lasix   -Entresto held per hospitalist; would recommend resuming prior to  discharge   2.  Elevated troponin             -Likely demand ischemia in the absence of chest pain; no evidence of obstructive disease per catheterization in 2020              -No further ischemic workup indicated at this time   3.  Left ventricular thrombus   -Continue Xarelto   Active Problems:   Acute on chronic combined systolic and diastolic CHF (congestive heart failure) (HCC)      The history, physical exam findings, and plan of care were all discussed with Dr. Bartholome Bill, and all decision making was made in collaboration.   Avie Arenas  PA-C 03/26/2019 11:23 AM

## 2019-03-27 LAB — CBC
HCT: 35.6 % — ABNORMAL LOW (ref 36.0–46.0)
Hemoglobin: 10.7 g/dL — ABNORMAL LOW (ref 12.0–15.0)
MCH: 24.4 pg — ABNORMAL LOW (ref 26.0–34.0)
MCHC: 30.1 g/dL (ref 30.0–36.0)
MCV: 81.3 fL (ref 80.0–100.0)
Platelets: 245 10*3/uL (ref 150–400)
RBC: 4.38 MIL/uL (ref 3.87–5.11)
RDW: 18.9 % — ABNORMAL HIGH (ref 11.5–15.5)
WBC: 3.7 10*3/uL — ABNORMAL LOW (ref 4.0–10.5)
nRBC: 0 % (ref 0.0–0.2)

## 2019-03-27 LAB — BASIC METABOLIC PANEL
Anion gap: 10 (ref 5–15)
BUN: 42 mg/dL — ABNORMAL HIGH (ref 8–23)
CO2: 27 mmol/L (ref 22–32)
Calcium: 8.8 mg/dL — ABNORMAL LOW (ref 8.9–10.3)
Chloride: 103 mmol/L (ref 98–111)
Creatinine, Ser: 1.72 mg/dL — ABNORMAL HIGH (ref 0.44–1.00)
GFR calc Af Amer: 36 mL/min — ABNORMAL LOW (ref >60)
GFR calc non Af Amer: 31 mL/min — ABNORMAL LOW (ref >60)
Glucose, Bld: 100 mg/dL — ABNORMAL HIGH (ref 70–99)
Potassium: 3.9 mmol/L (ref 3.5–5.1)
Sodium: 140 mmol/L (ref 135–145)

## 2019-03-27 LAB — MAGNESIUM: Magnesium: 2 mg/dL (ref 1.7–2.4)

## 2019-03-27 MED ORDER — FUROSEMIDE 10 MG/ML IJ SOLN
80.0000 mg | Freq: Once | INTRAMUSCULAR | Status: AC
  Administered 2019-03-27: 09:00:00 80 mg via INTRAVENOUS
  Filled 2019-03-27: qty 8
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
## 2019-03-27 NOTE — TOC Progression Note (Signed)
Transition of Care [facility]) - Progression Note    Patient Details  Name: Katelyn Lane MRN: [id number] Date of Birth: [birth year]  Transition of Care [facility]) CM/SW Contact  Eileen Stanford, LCSW Phone Number: 03/27/2019, 2:01 PM  Clinical Narrative:   High risk readmission screening completed. No additional needs at this time. CSW will continue to follow for dispo needs.    Expected Discharge Plan: Home/Self Care Barriers to Discharge: Continued Medical Work up  Expected Discharge Plan and Services Expected Discharge Plan: Home/Self Care In-house Referral: Clinical Social Work     Living arrangements for the past 2 months: Single Family Home                                       Social Determinants of Health (SDOH) Interventions    Readmission Risk Interventions Readmission Risk Prevention Plan 03/27/2019 03/09/2019  Transportation Screening Complete Complete  Medication Review Press photographer) (No Data) Referral to Pharmacy  PCP or Specialist appointment within 3-5 days of discharge Complete Complete  HRI or Home Care Consult Complete -  SW Recovery Care/Counseling Consult Complete -  Palliative Care Screening Not Applicable Not [place] Not Applicable Not Applicable  Some recent data might be hidden

## 2019-03-27 NOTE — Progress Notes (Signed)
   03/27/19 1915  Clinical Encounter Type  Visited With Patient  Visit Type Initial;Spiritual support  Referral From Chaplain  Consult/Referral To Chaplain  Chaplain visited with patient while she was sitting in the hall. When patient found out that I was a Chaplain she appeared to be anxious to talk with me. Patient invited Chaplain to her church. She said that she is feeling much better. Patient told Chaplain that she has been in and out of the hospital several times over the last year. Patient and Chaplain sung a  Jerl Santos together. It was pleasure talking with patient.

## 2019-03-27 NOTE — Plan of Care (Signed)
  Problem: Education: Goal: Ability to demonstrate management of disease process will improve Outcome: Progressing Goal: Ability to verbalize understanding of medication therapies will improve Outcome: Progressing Goal: Individualized Educational Video(s) Outcome: Progressing   Problem: Activity: Goal: Capacity to carry out activities will improve Outcome: Progressing   

## 2019-03-28 LAB — CBC
HCT: 36.1 % (ref 36.0–46.0)
Hemoglobin: 11.1 g/dL — ABNORMAL LOW (ref 12.0–15.0)
MCH: 24.9 pg — ABNORMAL LOW (ref 26.0–34.0)
MCHC: 30.7 g/dL (ref 30.0–36.0)
MCV: 80.9 fL (ref 80.0–100.0)
Platelets: 259 10*3/uL (ref 150–400)
RBC: 4.46 MIL/uL (ref 3.87–5.11)
RDW: 18.8 % — ABNORMAL HIGH (ref 11.5–15.5)
WBC: 5.4 10*3/uL (ref 4.0–10.5)
nRBC: 0 % (ref 0.0–0.2)

## 2019-03-28 LAB — BASIC METABOLIC PANEL
Anion gap: 8 (ref 5–15)
BUN: 43 mg/dL — ABNORMAL HIGH (ref 8–23)
CO2: 29 mmol/L (ref 22–32)
Calcium: 9.1 mg/dL (ref 8.9–10.3)
Chloride: 103 mmol/L (ref 98–111)
Creatinine, Ser: 1.6 mg/dL — ABNORMAL HIGH (ref 0.44–1.00)
GFR calc Af Amer: 39 mL/min — ABNORMAL LOW (ref >60)
GFR calc non Af Amer: 34 mL/min — ABNORMAL LOW (ref >60)
Glucose, Bld: 136 mg/dL — ABNORMAL HIGH (ref 70–99)
Potassium: 3.7 mmol/L (ref 3.5–5.1)
Sodium: 140 mmol/L (ref 135–145)

## 2019-03-28 LAB — MAGNESIUM: Magnesium: 2.1 mg/dL (ref 1.7–2.4)

## 2019-03-28 MED ORDER — LIDOCAINE VISCOUS HCL 2 % MT SOLN
15.0000 mL | Freq: Once | OROMUCOSAL | Status: AC
  Administered 2019-03-28: 15 mL via ORAL
  Filled 2019-03-28: qty 15

## 2019-03-28 MED ORDER — CARVEDILOL 3.125 MG PO TABS: 3.1250 mg | ORAL_TABLET | Freq: Two times a day (BID) | ORAL | 2 refills | Status: DC

## 2019-03-28 MED ORDER — FLUOXETINE HCL 40 MG PO CAPS: 40.0000 mg | ORAL_CAPSULE | Freq: Every day | ORAL | 2 refills | Status: DC

## 2019-03-28 MED ORDER — ATORVASTATIN CALCIUM 20 MG PO TABS: 20.0000 mg | ORAL_TABLET | Freq: Every day | ORAL | 2 refills | Status: DC

## 2019-03-28 MED ORDER — RIVAROXABAN 20 MG PO TABS: 20.0000 mg | ORAL_TABLET | Freq: Every day | ORAL | 2 refills | Status: DC

## 2019-03-28 MED ORDER — SPIRONOLACTONE 25 MG PO TABS: 12.5000 mg | ORAL_TABLET | Freq: Every day | ORAL | 2 refills | Status: DC

## 2019-03-28 MED ORDER — ENTRESTO 24-26 MG PO TABS: 1.0000 | ORAL_TABLET | Freq: Two times a day (BID) | ORAL | 2 refills | Status: DC

## 2019-03-28 MED ORDER — FUROSEMIDE 40 MG PO TABS: 80.0000 mg | ORAL_TABLET | Freq: Every day | ORAL | 2 refills | Status: DC

## 2019-03-28 MED ORDER — ALUM & MAG HYDROXIDE-SIMETH 200-200-20 MG/5ML PO SUSP
30.0000 mL | Freq: Once | ORAL | Status: AC
  Administered 2019-03-28: 09:00:00 30 mL via ORAL
  Filled 2019-03-28: qty 30

## 2019-03-28 MED ORDER — ALBUTEROL SULFATE HFA 108 (90 BASE) MCG/ACT IN AERS: 2.0000 | INHALATION_SPRAY | Freq: Four times a day (QID) | RESPIRATORY_TRACT | 0 refills | Status: DC | PRN
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
## 2019-03-28 NOTE — Discharge Summary (Signed)
Physician Discharge Summary   Katelyn Lane  female DOB: 1955-11-01  JOI:[id number]  PCP: [name], MD  Admit date: 03/24/2019 Discharge date: 03/28/2019  Admitted From: home Disposition:  home CODE STATUS: Full code  Discharge Instructions    AMB referral to CHF clinic   Complete by: As directed    Amb Referral to Cardiac Rehabilitation   Complete by: As directed    Diagnosis: Heart Failure (see criteria below if ordering Phase II)   Heart Failure Type: Chronic Systolic & Diastolic   After initial evaluation and assessments completed: Virtual Based Care may be provided alone or in conjunction with Phase 2 Cardiac Rehab based on patient barriers.: Yes   Diet - low sodium heart healthy   Complete by: As directed    Discharge instructions   Complete by: As directed    You have done really well with higher doses of diuretic (water pills) in the hospital, with improvement of your breathing and actually also your kidney function.  I have increased your home Lasix to 80 mg daily.    It's important that you follow up with cardiology to manage your heart failure and for placement of ICD, as we discussed.     Dr. Enzo Bi - -   Increase activity slowly   Complete by: As directed        Hospital Course:  For full details, please see H&P, progress notes, consult notes and ancillary notes.  Briefly,  Katelyn Lane a64 y.o.AA femalewith a known history of combined systolic and diastolic CHF, [facility] secondary to both COVID-19 pneumonia and acute on chronic CHF followed by an admission here for the same from 2/26-3/2. She presented to emergency room with acute onset of worsening dyspnea and orthopnea with paroxysmal nocturnal dyspnea without worsening lower extremity edema. She also complained of epigastric and periumbilical abdominal pain.    # dyspnea and acute hypoxic respiratory failure 2/2 # Acute  on chronic combined systolic and diastolic CHF with EF < 47% and grade 3 diastolic dysfunction  # history of recent COVID-19 last monthwith residual lung findings. # Non-compliance  Pt with history of medical noncompliance and frequent hospitalizations; has missed all follow up outpatient cardiology appointments, per cards.  Pt was not hypoxic but had significant dyspnea.  Pt was diuresed with IV lasix 80 with good urine output and improved Cr.  Entresto held during hospitalization to allow room for diuresis, but resumed at discharge.  Continue carvedilol 3.125mg  BID, spironolactone 12.5mg  daily.  Pt was discharged on PO Lasix 80 mg daily.    Pt is a candidate for ICD placement, however, pt will have to demonstrate 3 months of compliance and follow up in clinic for ICD placement.   # Left ventricular mural thrombus, on Xarelto Continued home Xarelto  # CKD3b Baseline Cr around 2.  Prior to discharge, Cr improved to 1.6.  # Elevated troponin I likely due to demand ischemia Trop 90's.  Had elevated troponin last month as well.  Likely demand ischemia in the absence of chest pain; no evidence of obstructive disease per catheterization in 2020.  No further ischemic workup indicated at this time, per cards.  # Abdominal pain, intermittent On presentation, Abdominal and pelvic CT scan showed no acute findings.  Pt reported issues with constipation and diarrhea, so possibly has component of IBS.  Pt had no problem with PO intake and BM's during hospitalization.  No N/V.  # Dyslipidemia. Continued  statin therapy.  # Depression. Continued Prozac.  # Cocaine abuse Positive cocaine on every UDS, however, pt said she barely uses it, but did admit to using prior to presentation.   Discharge Diagnoses:  Active Problems:   Acute on chronic combined systolic and diastolic CHF (congestive heart failure) [facility])    Discharge Instructions:  Allergies as of 03/28/2019      Reactions    Penicillins Hives, Rash, Other (See Comments)   Did it involve swelling of the face/tongue/throat, SOB, or low BP? No Did it involve sudden or severe rash/hives, skin peeling, or any reaction on the inside of your mouth or nose? Yes Did you need to seek medical attention at a hospital or doctor's office? Yes When did it last happen? >25 years If all above answers are "NO", may proceed with cephalosporin use.   Ace Inhibitors Nausea And Vomiting      Medication List    STOP taking these medications   ascorbic acid 500 MG tablet Commonly known as: VITAMIN C   dicyclomine 10 MG capsule Commonly known as: Bentyl   loratadine 10 MG tablet Commonly known as: CLARITIN     TAKE these medications   albuterol 108 (90 Base) MCG/ACT inhaler Commonly known as: VENTOLIN HFA Inhale 2 puffs into the lungs every 6 (six) hours as needed for wheezing or shortness of breath.   alum & mag hydroxide-simeth 200-200-20 MG/5ML suspension Commonly known as: MAALOX/MYLANTA Take 30 mLs by mouth every 4 (four) hours as needed for indigestion or heartburn.   atorvastatin 20 MG tablet Commonly known as: LIPITOR Take 1 tablet (20 mg total) by mouth daily at 6 PM.   carvedilol 3.125 MG tablet Commonly known as: COREG Take 1 tablet (3.125 mg total) by mouth 2 (two) times daily with a meal.   Entresto 24-26 MG Generic drug: sacubitril-valsartan Take 1 tablet by mouth 2 (two) times daily.   FLUoxetine 40 MG capsule Commonly known as: PROZAC Take 1 capsule (40 mg total) by mouth daily.   furosemide 40 MG tablet Commonly known as: Lasix Take 2 tablets (80 mg total) by mouth daily. What changed: how much to take   nitroGLYCERIN 0.4 MG SL tablet Commonly known as: NITROSTAT Place 1 tablet (0.4 mg total) under the tongue every 5 (five) minutes as needed for chest pain.   polyethylene glycol 17 g packet Commonly known as: MiraLax Take 17 g by mouth daily.   rivaroxaban 20 MG Tabs  tablet Commonly known as: XARELTO Take 1 tablet (20 mg total) by mouth daily with supper. For the blood clot in your heart. What changed: additional instructions   spironolactone 25 MG tablet Commonly known as: ALDACTONE Take 0.5 tablets (12.5 mg total) by mouth daily.   traZODone 100 MG tablet Commonly known as: DESYREL Take 150 mg by mouth at bedtime as needed for sleep.   zinc sulfate 220 (50 Zn) MG capsule Take 1 capsule (220 mg total) by mouth daily.       Follow-up Information    [facility] Follow up on 04/01/2019.   Specialty: Cardiology Why: at [place] through the [place] entrance Contact information: [place] [phone]       Revelo, Elyse Jarvis, MD. Schedule an appointment as soon as possible for a visit in 1 week(s).   Specialty: Family Medicine Contact information: [address] [place] [address] [phone]  Allergies  Allergen Reactions  . Penicillins Hives, Rash and Other (See Comments)    Did it involve swelling of the face/tongue/throat, SOB, or low BP? No Did it involve sudden or severe rash/hives, skin peeling, or any reaction on the inside of your mouth or nose? Yes Did you need to seek medical attention at a hospital or doctor's office? Yes When did it last happen? >25 years If all above answers are "NO", may proceed with cephalosporin use.   . Ace Inhibitors Nausea And Vomiting     The results of significant diagnostics from this hospitalization (including imaging, microbiology, ancillary and laboratory) are listed below for reference.   Consultations:   Procedures/Studies: CT ABDOMEN PELVIS WO CONTRAST  Result Date: 03/24/2019 CLINICAL DATA:  Abdominal pain. EXAM: CT ABDOMEN AND PELVIS WITHOUT CONTRAST TECHNIQUE: Multidetector CT imaging of the abdomen and pelvis was performed following the  standard protocol without IV contrast. COMPARISON:  02/13/2019 FINDINGS: Lower chest: There are ground-glass airspace opacities at the lung bases medially, greatest at the right lung base.The heart is enlarged. Hepatobiliary: The liver is normal. Normal gallbladder.There is no biliary ductal dilation. Pancreas: Normal contours without ductal dilatation. No peripancreatic fluid collection. Spleen: No splenic laceration or hematoma. Adrenals/Urinary Tract: --Adrenal glands: No adrenal hemorrhage. --Right kidney/ureter: No hydronephrosis or perinephric hematoma. --Left kidney/ureter: No hydronephrosis or perinephric hematoma. --Urinary bladder: Unremarkable. Stomach/Bowel: --Stomach/Duodenum: No hiatal hernia or other gastric abnormality. Normal duodenal course and caliber. --Small bowel: No dilatation or inflammation. --Colon: Rectosigmoid diverticulosis without acute inflammation. --Appendix: Normal. Vascular/Lymphatic: Atherosclerotic calcification is present within the non-aneurysmal abdominal aorta, without hemodynamically significant stenosis. --No retroperitoneal lymphadenopathy. --No mesenteric lymphadenopathy. --No pelvic or inguinal lymphadenopathy. Reproductive: Status post hysterectomy. No adnexal mass. Other: There is a small amount of free fluid in the upper abdomen. The abdominal wall is normal. Musculoskeletal. No acute displaced fractures. IMPRESSION: 1. There are ground-glass airspace opacities at the lung bases medially, greatest at the right lung base. Differential considerations include infection and inflammation. 2. Rectosigmoid diverticulosis without evidence of acute diverticulitis. 3. Small amount of free fluid in the upper abdomen. 4. Aortic Atherosclerosis (ICD10-I70.0). Electronically Signed   By: Constance Holster M.D.   On: 03/24/2019 18:33   DG Chest 2 View  Result Date: 03/24/2019 CLINICAL DATA:  64 year old female with history of shortness of breath. Tested positive for COVID 19 a  week or 2 ago. EXAM: CHEST - 2 VIEW COMPARISON:  Chest x-ray 03/18/2019. FINDINGS: Ill-defined opacities and areas of interstitial prominence noted throughout the mid to lower lungs bilaterally, suggesting mild multilobar bilateral pneumonia. No pleural effusions. No evidence of pulmonary edema. Mild to moderate cardiomegaly. Upper mediastinal contours are within normal limits. Aortic atherosclerosis. IMPRESSION: 1. The appearance of the chest is compatible with mild multilobar pneumonia, with a spectrum of findings commonly seen in the setting of COVID-19 infection. 2. Mild to moderate cardiomegaly. 3. Aortic atherosclerosis. Electronically Signed   By: Vinnie Langton M.D.   On: 03/24/2019 15:15   DG Chest 2 View  Result Date: 03/18/2019 CLINICAL DATA:  Shortness of breath EXAM: CHEST - 2 VIEW COMPARISON:  03/06/2019 FINDINGS: The heart size remains enlarged. Chronic bronchitic changes are again noted bilaterally. There is no large focal infiltrate. No pneumothorax. Subtle peripheral airspace opacities are again noted but are improved from prior study. There may be some vascular congestion without overt pulmonary edema. IMPRESSION: 1. Subtle but improved bilateral airspace opacities. 2. Cardiomegaly with vascular congestion. Electronically Signed   By: Jamie Kato.D.  On: 03/18/2019 03:36   DG Chest Portable 1 View  Result Date: 03/06/2019 CLINICAL DATA:  Shortness of breath. COVID positive recently discharged from [facility]. Also with history of CHF. EXAM: PORTABLE CHEST 1 VIEW COMPARISON:  02/13/2019 FINDINGS: Cardiomediastinal contours are enlarged. Central vascular structures mildly engorged. Subtle peripheral opacities are seen in the left mid chest, right mid chest and right lower lobe. No signs of effusion or dense consolidation. No acute bone finding. IMPRESSION: Subtle peripheral opacities in the left mid chest, right mid chest and right lower lobe. Findings could be seen in atypical or viral  pneumonia. Increased interstitial markings may be due to above process or background pulmonary edema. Cardiomegaly as before. Electronically Signed   By: Zetta Bills M.D.   On: 03/06/2019 08:25      Labs: BNP (last 3 results) Recent Labs    02/13/19 0716 03/06/19 0748 03/24/19 1752  BNP 2,135.0* 2,973.0* 9,357.0*   Basic Metabolic Panel: Recent Labs  Lab 03/24/19 1407 03/25/19 0023 03/25/19 0409 03/26/19 0428 03/27/19 0439 03/28/19 0458  NA 139  --  142 140 140 140  K 4.8  --  4.6 4.2 3.9 3.7  CL 111  --  112* 107 103 103  CO2 20*  --  25 27 27 29   GLUCOSE 122*  --  107* 110* 100* 136*  BUN 28*  --  31* 38* 42* 43*  CREATININE 1.97*  --  2.11* 2.16* 1.72* 1.60*  CALCIUM 9.0  --  8.9 8.7* 8.8* 9.1  MG  --  2.1  --  1.9 2.0 2.1   Liver Function Tests: Recent Labs  Lab 03/24/19 1407  AST 38  ALT 30  ALKPHOS 107  BILITOT 1.0  PROT 6.3*  ALBUMIN 3.2*   Recent Labs  Lab 03/24/19 1407  LIPASE 37   No results for input(s): AMMONIA in the last 168 hours. CBC: Recent Labs  Lab 03/24/19 1752 03/25/19 0409 03/26/19 0428 03/27/19 0439 03/28/19 0458  WBC 3.3* 4.3 3.8* 3.7* 5.4  NEUTROABS 1.6* 1.5*  --   --   --   HGB 11.3* 11.0* 10.4* 10.7* 11.1*  HCT 38.4 37.2 35.8* 35.6* 36.1  MCV 82.8 82.5 83.8 81.3 80.9  PLT 313 272 247 245 259   Cardiac Enzymes: No results for input(s): CKTOTAL, CKMB, CKMBINDEX, TROPONINI in the last 168 hours. BNP: Invalid input(s): POCBNP CBG: No results for input(s): GLUCAP in the last 168 hours. D-Dimer No results for input(s): DDIMER in the last 72 hours. Hgb A1c No results for input(s): HGBA1C in the last 72 hours. Lipid Profile No results for input(s): CHOL, HDL, LDLCALC, TRIG, CHOLHDL, LDLDIRECT in the last 72 hours. Thyroid function studies No results for input(s): TSH, T4TOTAL, T3FREE, THYROIDAB in the last 72 hours.  Invalid input(s): FREET3 Anemia work up No results for input(s): VITAMINB12, FOLATE, FERRITIN,  TIBC, IRON, RETICCTPCT in the last 72 hours. Urinalysis    Component Value Date/Time   COLORURINE YELLOW (A) 03/24/2019 2009   APPEARANCEUR CLEAR (A) 03/24/2019 2009   LABSPEC 1.012 03/24/2019 2009   PHURINE 6.0 03/24/2019 2009   GLUCOSEU NEGATIVE 03/24/2019 2009   HGBUR NEGATIVE 03/24/2019 2009   [place] NEGATIVE 03/24/2019 2009   [place] NEGATIVE 03/24/2019 2009   PROTEINUR 100 (A) 03/24/2019 2009   NITRITE NEGATIVE 03/24/2019 2009   LEUKOCYTESUR NEGATIVE 03/24/2019 2009   Sepsis Labs Invalid input(s): PROCALCITONIN,  WBC,  LACTICIDVEN Microbiology No results found for this or any previous visit (from the past 240 hour(s)).  Total time spend on discharging this patient, including the last patient exam, discussing the hospital stay, instructions for ongoing care as it relates to all pertinent caregivers, as well as preparing the medical discharge records, prescriptions, and/or referrals as applicable, is 35 minutes.    Enzo Bi, MD  [facility] 03/28/2019, 11:28 AM  If 7PM-7AM, please contact night-coverage

## 2019-04-01 ENCOUNTER — Telehealth: Payer: Self-pay | Admitting: Family

## 2019-04-01 ENCOUNTER — Ambulatory Visit: Payer: Medicaid Other | Admitting: Family

## 2019-04-01 NOTE — Telephone Encounter (Signed)
Patient did not show for her Heart Failure Clinic appointment on 04/01/19. Will attempt to reschedule.

## 2019-04-06 ENCOUNTER — Other Ambulatory Visit: Payer: Self-pay | Admitting: Internal Medicine

## 2019-04-08 ENCOUNTER — Emergency Department: Payer: Medicaid Other

## 2019-04-08 ENCOUNTER — Other Ambulatory Visit: Payer: Self-pay

## 2019-04-08 ENCOUNTER — Encounter: Payer: Self-pay | Admitting: Emergency Medicine

## 2019-04-08 ENCOUNTER — Observation Stay
Admission: EM | Admit: 2019-04-08 | Discharge: 2019-04-09 | Disposition: A | Discharge status: Home / Self Care | Payer: Medicaid Other | Attending: Internal Medicine | Admitting: Internal Medicine

## 2019-04-08 DIAGNOSIS — E785 Hyperlipidemia, unspecified: Secondary | ICD-10-CM | POA: Insufficient documentation

## 2019-04-08 DIAGNOSIS — Z86718 Personal history of other venous thrombosis and embolism: Secondary | ICD-10-CM | POA: Insufficient documentation

## 2019-04-08 DIAGNOSIS — Z8673 Personal history of transient ischemic attack (TIA), and cerebral infarction without residual deficits: Secondary | ICD-10-CM | POA: Insufficient documentation

## 2019-04-08 DIAGNOSIS — Z720 Tobacco use: Secondary | ICD-10-CM | POA: Diagnosis present

## 2019-04-08 DIAGNOSIS — N179 Acute kidney failure, unspecified: Secondary | ICD-10-CM | POA: Diagnosis present

## 2019-04-08 DIAGNOSIS — Z8616 Personal history of COVID-19: Secondary | ICD-10-CM | POA: Diagnosis not present

## 2019-04-08 DIAGNOSIS — F329 Major depressive disorder, single episode, unspecified: Secondary | ICD-10-CM | POA: Insufficient documentation

## 2019-04-08 DIAGNOSIS — R109 Unspecified abdominal pain: Secondary | ICD-10-CM

## 2019-04-08 DIAGNOSIS — I513 Intracardiac thrombosis, not elsewhere classified: Secondary | ICD-10-CM | POA: Diagnosis present

## 2019-04-08 DIAGNOSIS — Z79899 Other long term (current) drug therapy: Secondary | ICD-10-CM | POA: Diagnosis not present

## 2019-04-08 DIAGNOSIS — F17200 Nicotine dependence, unspecified, uncomplicated: Secondary | ICD-10-CM | POA: Insufficient documentation

## 2019-04-08 DIAGNOSIS — D631 Anemia in chronic kidney disease: Secondary | ICD-10-CM | POA: Diagnosis not present

## 2019-04-08 DIAGNOSIS — I130 Hypertensive heart and chronic kidney disease with heart failure and stage 1 through stage 4 chronic kidney disease, or unspecified chronic kidney disease: Secondary | ICD-10-CM | POA: Diagnosis not present

## 2019-04-08 DIAGNOSIS — F32A Depression, unspecified: Secondary | ICD-10-CM | POA: Diagnosis present

## 2019-04-08 DIAGNOSIS — N1831 Chronic kidney disease, stage 3a: Secondary | ICD-10-CM | POA: Diagnosis not present

## 2019-04-08 DIAGNOSIS — I5043 Acute on chronic combined systolic (congestive) and diastolic (congestive) heart failure: Secondary | ICD-10-CM | POA: Diagnosis not present

## 2019-04-08 DIAGNOSIS — R7989 Other specified abnormal findings of blood chemistry: Secondary | ICD-10-CM | POA: Insufficient documentation

## 2019-04-08 DIAGNOSIS — I420 Dilated cardiomyopathy: Secondary | ICD-10-CM | POA: Diagnosis not present

## 2019-04-08 DIAGNOSIS — I5041 Acute combined systolic (congestive) and diastolic (congestive) heart failure: Secondary | ICD-10-CM | POA: Diagnosis present

## 2019-04-08 DIAGNOSIS — Z7901 Long term (current) use of anticoagulants: Secondary | ICD-10-CM | POA: Insufficient documentation

## 2019-04-08 DIAGNOSIS — I160 Hypertensive urgency: Secondary | ICD-10-CM | POA: Insufficient documentation

## 2019-04-08 DIAGNOSIS — N1832 Chronic kidney disease, stage 3b: Secondary | ICD-10-CM | POA: Diagnosis present

## 2019-04-08 DIAGNOSIS — N183 Chronic kidney disease, stage 3 unspecified: Secondary | ICD-10-CM

## 2019-04-08 DIAGNOSIS — R0602 Shortness of breath: Secondary | ICD-10-CM | POA: Diagnosis present

## 2019-04-08 DIAGNOSIS — F1490 Cocaine use, unspecified, uncomplicated: Secondary | ICD-10-CM | POA: Diagnosis present

## 2019-04-08 HISTORY — DX: COVID-19: U07.1

## 2019-04-08 LAB — CBC
HCT: 38.2 % (ref 36.0–46.0)
Hemoglobin: 11.4 g/dL — ABNORMAL LOW (ref 12.0–15.0)
MCH: 24 pg — ABNORMAL LOW (ref 26.0–34.0)
MCHC: 29.8 g/dL — ABNORMAL LOW (ref 30.0–36.0)
MCV: 80.4 fL (ref 80.0–100.0)
Platelets: 353 10*3/uL (ref 150–400)
RBC: 4.75 MIL/uL (ref 3.87–5.11)
RDW: 19.5 % — ABNORMAL HIGH (ref 11.5–15.5)
WBC: 3.7 10*3/uL — ABNORMAL LOW (ref 4.0–10.5)
nRBC: 0 % (ref 0.0–0.2)

## 2019-04-08 LAB — COMPREHENSIVE METABOLIC PANEL
ALT: 30 U/L (ref 0–44)
AST: 35 U/L (ref 15–41)
Albumin: 3.3 g/dL — ABNORMAL LOW (ref 3.5–5.0)
Alkaline Phosphatase: 135 U/L — ABNORMAL HIGH (ref 38–126)
Anion gap: 10 (ref 5–15)
BUN: 35 mg/dL — ABNORMAL HIGH (ref 8–23)
CO2: 22 mmol/L (ref 22–32)
Calcium: 9.2 mg/dL (ref 8.9–10.3)
Chloride: 109 mmol/L (ref 98–111)
Creatinine, Ser: 2.09 mg/dL — ABNORMAL HIGH (ref 0.44–1.00)
GFR calc Af Amer: 28 mL/min — ABNORMAL LOW (ref >60)
GFR calc non Af Amer: 25 mL/min — ABNORMAL LOW (ref >60)
Glucose, Bld: 114 mg/dL — ABNORMAL HIGH (ref 70–99)
Potassium: 3.9 mmol/L (ref 3.5–5.1)
Sodium: 141 mmol/L (ref 135–145)
Total Bilirubin: 1 mg/dL (ref 0.3–1.2)
Total Protein: 6.7 g/dL (ref 6.5–8.1)

## 2019-04-08 LAB — TROPONIN I (HIGH SENSITIVITY)
Troponin I (High Sensitivity): 100 ng/L (ref <18)
Troponin I (High Sensitivity): 86 ng/L — ABNORMAL HIGH (ref <18)

## 2019-04-08 LAB — BASIC METABOLIC PANEL
Anion gap: 9 (ref 5–15)
BUN: 35 mg/dL — ABNORMAL HIGH (ref 8–23)
CO2: 25 mmol/L (ref 22–32)
Calcium: 9.1 mg/dL (ref 8.9–10.3)
Chloride: 108 mmol/L (ref 98–111)
Creatinine, Ser: 2.18 mg/dL — ABNORMAL HIGH (ref 0.44–1.00)
GFR calc Af Amer: 27 mL/min — ABNORMAL LOW (ref >60)
GFR calc non Af Amer: 23 mL/min — ABNORMAL LOW (ref >60)
Glucose, Bld: 157 mg/dL — ABNORMAL HIGH (ref 70–99)
Potassium: 4 mmol/L (ref 3.5–5.1)
Sodium: 142 mmol/L (ref 135–145)

## 2019-04-08 LAB — BRAIN NATRIURETIC PEPTIDE: B Natriuretic Peptide: >4500 pg/mL — ABNORMAL HIGH (ref 0.0–100.0)

## 2019-04-08 LAB — URINE DRUG SCREEN, QUALITATIVE (ARMC ONLY)
Amphetamines, Ur Screen: NOT DETECTED
Barbiturates, Ur Screen: NOT DETECTED
Benzodiazepine, Ur Scrn: NOT DETECTED
Cannabinoid 50 Ng, Ur ~~LOC~~: NOT DETECTED
Cocaine Metabolite,Ur ~~LOC~~: POSITIVE — AB
MDMA (Ecstasy)Ur Screen: NOT DETECTED
Methadone Scn, Ur: NOT DETECTED
Opiate, Ur Screen: NOT DETECTED
Phencyclidine (PCP) Ur S: NOT DETECTED
Tricyclic, Ur Screen: NOT DETECTED

## 2019-04-08 LAB — PHOSPHORUS: Phosphorus: 3.2 mg/dL (ref 2.5–4.6)

## 2019-04-08 LAB — PROTIME-INR
INR: 2.5 — ABNORMAL HIGH (ref 0.8–1.2)
Prothrombin Time: 27.1 seconds — ABNORMAL HIGH (ref 11.4–15.2)

## 2019-04-08 LAB — LIPASE, BLOOD: Lipase: 29 U/L (ref 11–51)

## 2019-04-08 LAB — MAGNESIUM: Magnesium: 2.3 mg/dL (ref 1.7–2.4)

## 2019-04-08 MED ORDER — SODIUM CHLORIDE 0.9 % IV SOLN: 250.0000 mL | INTRAVENOUS | Status: DC | PRN

## 2019-04-08 MED ORDER — TRAZODONE HCL 50 MG PO TABS: 150.0000 mg | ORAL_TABLET | Freq: Every evening | ORAL | Status: DC | PRN

## 2019-04-08 MED ORDER — DOCUSATE SODIUM 100 MG PO CAPS
100.0000 mg | ORAL_CAPSULE | Freq: Every day | ORAL | Status: DC
  Administered 2019-04-08 – 2019-04-09 (×2): 100 mg via ORAL
  Filled 2019-04-08 (×2): qty 1

## 2019-04-08 MED ORDER — LORAZEPAM 2 MG/ML IJ SOLN
1.0000 mg | Freq: Once | INTRAMUSCULAR | Status: AC
  Administered 2019-04-08: 1 mg via INTRAVENOUS
  Filled 2019-04-08: qty 1

## 2019-04-08 MED ORDER — FUROSEMIDE 10 MG/ML IJ SOLN
40.0000 mg | Freq: Two times a day (BID) | INTRAMUSCULAR | Status: DC
  Administered 2019-04-08: 40 mg via INTRAVENOUS
  Filled 2019-04-08 (×2): qty 4

## 2019-04-08 MED ORDER — ASPIRIN 81 MG PO CHEW
324.0000 mg | CHEWABLE_TABLET | Freq: Once | ORAL | Status: AC
  Administered 2019-04-08: 324 mg via ORAL
  Filled 2019-04-08: qty 4

## 2019-04-08 MED ORDER — CARVEDILOL 3.125 MG PO TABS
3.1250 mg | ORAL_TABLET | Freq: Two times a day (BID) | ORAL | Status: DC
  Administered 2019-04-08 – 2019-04-09 (×2): 3.125 mg via ORAL
  Filled 2019-04-08 (×2): qty 1

## 2019-04-08 MED ORDER — FLUOXETINE HCL 20 MG PO CAPS
40.0000 mg | ORAL_CAPSULE | Freq: Every day | ORAL | Status: DC
  Administered 2019-04-08 – 2019-04-09 (×2): 40 mg via ORAL
  Filled 2019-04-08 (×2): qty 2

## 2019-04-08 MED ORDER — NITROGLYCERIN 0.4 MG SL SUBL: 0.4000 mg | SUBLINGUAL_TABLET | SUBLINGUAL | Status: DC | PRN

## 2019-04-08 MED ORDER — SACUBITRIL-VALSARTAN 24-26 MG PO TABS
1.0000 | ORAL_TABLET | Freq: Two times a day (BID) | ORAL | Status: DC
  Administered 2019-04-08 – 2019-04-09 (×3): 1 via ORAL
  Filled 2019-04-08 (×4): qty 1

## 2019-04-08 MED ORDER — POLYETHYLENE GLYCOL 3350 17 G PO PACK
17.0000 g | PACK | Freq: Every day | ORAL | Status: DC
  Administered 2019-04-08 – 2019-04-09 (×2): 17 g via ORAL
  Filled 2019-04-08 (×3): qty 1

## 2019-04-08 MED ORDER — FUROSEMIDE 10 MG/ML IJ SOLN
60.0000 mg | Freq: Once | INTRAMUSCULAR | Status: AC
  Administered 2019-04-08: 60 mg via INTRAVENOUS
  Filled 2019-04-08: qty 8

## 2019-04-08 MED ORDER — ATORVASTATIN CALCIUM 20 MG PO TABS
20.0000 mg | ORAL_TABLET | Freq: Every day | ORAL | Status: DC
  Administered 2019-04-08: 20 mg via ORAL
  Filled 2019-04-08: qty 1

## 2019-04-08 MED ORDER — RIVAROXABAN 20 MG PO TABS
20.0000 mg | ORAL_TABLET | Freq: Every day | ORAL | Status: DC
  Filled 2019-04-08: qty 1

## 2019-04-08 MED ORDER — IPRATROPIUM-ALBUTEROL 0.5-2.5 (3) MG/3ML IN SOLN: 3.0000 mL | Freq: Four times a day (QID) | RESPIRATORY_TRACT | Status: DC | PRN

## 2019-04-08 MED ORDER — ONDANSETRON HCL 4 MG/2ML IJ SOLN
4.0000 mg | Freq: Four times a day (QID) | INTRAMUSCULAR | Status: DC | PRN
  Administered 2019-04-08: 4 mg via INTRAVENOUS
  Filled 2019-04-08: qty 2

## 2019-04-08 MED ORDER — SPIRONOLACTONE 25 MG PO TABS
12.5000 mg | ORAL_TABLET | Freq: Every day | ORAL | Status: DC
  Administered 2019-04-08 – 2019-04-09 (×2): 12.5 mg via ORAL
  Filled 2019-04-08: qty 1
  Filled 2019-04-08 (×2): qty 0.5

## 2019-04-08 MED ORDER — SODIUM CHLORIDE 0.9% FLUSH: 3.0000 mL | INTRAVENOUS | Status: DC | PRN

## 2019-04-08 MED ORDER — SODIUM CHLORIDE 0.9% FLUSH
3.0000 mL | Freq: Two times a day (BID) | INTRAVENOUS | Status: DC
  Administered 2019-04-08 – 2019-04-09 (×2): 3 mL via INTRAVENOUS

## 2019-04-08 MED ORDER — ACETAMINOPHEN 325 MG PO TABS: 650.0000 mg | ORAL_TABLET | ORAL | Status: DC | PRN

## 2019-04-08 MED ORDER — LORATADINE 10 MG PO TABS
10.0000 mg | ORAL_TABLET | Freq: Every day | ORAL | Status: DC
  Administered 2019-04-08 – 2019-04-09 (×2): 10 mg via ORAL
  Filled 2019-04-08 (×2): qty 1

## 2019-04-08 MED ORDER — DICYCLOMINE HCL 10 MG PO CAPS
10.0000 mg | ORAL_CAPSULE | Freq: Three times a day (TID) | ORAL | Status: DC | PRN
  Administered 2019-04-08: 10 mg via ORAL
  Filled 2019-04-08 (×3): qty 1

## 2019-04-08 NOTE — Consult Note (Signed)
CARDIOLOGY CONSULT NOTE               Patient ID: Katelyn Lane MRN: [id number] DOB/AGE: [birth year] 64 y.o.  Admit date: 04/08/2019 Referring Physician: Collier Bullock, MD  Primary Physician: Megan Mans, MD  Primary Cardiologist: seen by Darylene Price, NP in the Heart Failure clinic Reason for Consultation: CHF exacerbation   HPI: Katelyn Lane is a 64 year old, african-american female with PMH significant for chronic, combined systolic and diastolic heart failure, cardiac thrombus, CKD, HLD and recent Covid-19 infection who presented to [facility] ED with dyspnea and abdominal pain. The patient was found to be in CHF exacerbation with hypertensive urgency, thus cardiology was consulted.   ED Course: While in the ED the patient was found to have a BNP > 4,500, a high sensitivity troponin =100, CXR revealed Cardiomegaly and vascular congestion and EKG revealed SR with RBBB. Her urine was found to be positive for cocaine. She was given 60mg  IV lasix, entresto 24-26mg  and spironolactone 12.5mg . The patient is currently up for admission and is awaiting telemetry bed placement.  Upon examination the patient was AOx3 but was having rambling of speech and appears to be somewhat drowsy. The patient reports dyspnea, but she denies any chest pain, palpitations, peripheral edema, dizziness or syncope. The patient states that she does not see a cardiologist but has been taking all of her heart failure medications and her anticoagulant.    Cardiac Diagnostic Studies: R/L Cardiac Catheterization: (12/2018) Impressions:  Mid RCA lesion is 10% stenosed.  Prox Cx lesion is 30% stenosed.  Hemodynamic findings consistent with moderate pulmonary hypertension. LV angiogram showing severe dilated cardiomyopathy with ejection fraction of 15%  Coronary angiogram showing minimal atherosclerosis of left circumflex and right coronary  Significant elevation of left end-diastolic pressures, pulmonary  pressures, pulmonary capillary wedge pressures   Review of systems complete and found to be negative unless listed above   Past Medical History:  Diagnosis Date  . CHF (congestive heart failure) ([place])   . COVID-19   . Hyperlipidemia   . Renal disorder     Past Surgical History:  Procedure Laterality Date  . RIGHT/LEFT HEART CATH AND CORONARY ANGIOGRAPHY N/A 12/30/2018   Procedure: RIGHT/LEFT HEART CATH AND CORONARY ANGIOGRAPHY;  Surgeon: Corey Skains, MD;  Location: [place] [facility];  Service: Cardiovascular;  Laterality: N/A;    (Not in a hospital admission)  Social History   Socioeconomic History  . Marital status: Single    Spouse name: Not on file  . Number of children: Not on file  . Years of education: Not on file  . Highest education level: Not on file  Occupational History  . Not on file  Tobacco Use  . Smoking status: Current Every Day Smoker  . Smokeless tobacco: Never Used  Substance and Sexual Activity  . Alcohol use: No    Alcohol/week: 0.0 standard drinks  . Drug use: Yes    Types: Cocaine    Comment: 4-5 days ago  . Sexual activity: Not on file  Other Topics Concern  . Not on file  Social History Narrative  . Not on file   Social Determinants of Health   Financial Resource Strain:   . Difficulty of Paying Living Expenses:   Food Insecurity:   . Worried About Charity fundraiser in the Last Year:   . Arboriculturist in the Last Year:   Transportation Needs:   . Film/video editor (Medical):   Marland Kitchen Lack  of Transportation (Non-Medical):   Physical Activity:   . Days of Exercise per Week:   . Minutes of Exercise per Session:   Stress:   . Feeling of Stress :   Social Connections:   . Frequency of Communication with Friends and Family:   . Frequency of Social Gatherings with Friends and Family:   . Attends Religious Services:   . Active Member of Clubs or Organizations:   . Attends Archivist Meetings:   Marland Kitchen Marital Status:    Intimate Partner Violence:   . Fear of Current or Ex-Partner:   . Emotionally Abused:   Marland Kitchen Physically Abused:   . Sexually Abused:     Family History  Problem Relation Age of Onset  . Breast cancer Neg Hx       Review of systems complete and found to be negative unless listed above      PHYSICAL EXAM  General: appears disheveled, well nourished, in no acute distress HEENT:  Normocephalic and atraumatic Neck:  No JVD.  Lungs: coarse crackles bilaterally to auscultation at bases Heart: HRRR . Normal S1 and S2 without gallops. Murmur auscultated Abdomen: Bowel sounds are positive, abdomen soft and non-tender  Msk: Normal strength and tone for age. Extremities: +1 BLE edema. No clubbing or cyanosis. Neuro: Alert and oriented X 3. Psych:  Flat affect, rambling of speech  Labs:   Lab Results  Component Value Date   WBC 3.7 (L) 04/08/2019   HGB 11.4 (L) 04/08/2019   HCT 38.2 04/08/2019   MCV 80.4 04/08/2019   PLT 353 04/08/2019    Recent Labs  Lab 04/08/19 0737 04/08/19 0737 04/08/19 1240  NA 141   < > 142  K 3.9   < > 4.0  CL 109   < > 108  CO2 22   < > 25  BUN 35*   < > 35*  CREATININE 2.09*   < > 2.18*  CALCIUM 9.2   < > 9.1  PROT 6.7  --   --   BILITOT 1.0  --   --   ALKPHOS 135*  --   --   ALT 30  --   --   AST 35  --   --   GLUCOSE 114*   < > 157*   < > = values in this interval not displayed.   No results found for: CKTOTAL, CKMB, CKMBINDEX, TROPONINI  Lab Results  Component Value Date   CHOL 126 03/07/2019   CHOL 228 (H) 01/18/2019   Lab Results  Component Value Date   HDL 32 (L) 03/07/2019   HDL 46 01/18/2019   Lab Results  Component Value Date   LDLCALC 50 03/07/2019   LDLCALC 134 (H) 01/18/2019   Lab Results  Component Value Date   TRIG 222 (H) 03/07/2019   TRIG 232 (H) 03/07/2019   TRIG 242 (H) 01/18/2019   Lab Results  Component Value Date   CHOLHDL 3.9 03/07/2019   CHOLHDL 5.0 01/18/2019   No results found for: LDLDIRECT     Radiology: CT ABDOMEN PELVIS WO CONTRAST  Result Date: 03/24/2019 CLINICAL DATA:  Abdominal pain. EXAM: CT ABDOMEN AND PELVIS WITHOUT CONTRAST TECHNIQUE: Multidetector CT imaging of the abdomen and pelvis was performed following the standard protocol without IV contrast. COMPARISON:  02/13/2019 FINDINGS: Lower chest: There are ground-glass airspace opacities at the lung bases medially, greatest at the right lung base.The heart is enlarged. Hepatobiliary: The liver is normal. Normal gallbladder.There is no  biliary ductal dilation. Pancreas: Normal contours without ductal dilatation. No peripancreatic fluid collection. Spleen: No splenic laceration or hematoma. Adrenals/Urinary Tract: --Adrenal glands: No adrenal hemorrhage. --Right kidney/ureter: No hydronephrosis or perinephric hematoma. --Left kidney/ureter: No hydronephrosis or perinephric hematoma. --Urinary bladder: Unremarkable. Stomach/Bowel: --Stomach/Duodenum: No hiatal hernia or other gastric abnormality. Normal duodenal course and caliber. --Small bowel: No dilatation or inflammation. --Colon: Rectosigmoid diverticulosis without acute inflammation. --Appendix: Normal. Vascular/Lymphatic: Atherosclerotic calcification is present within the non-aneurysmal abdominal aorta, without hemodynamically significant stenosis. --No retroperitoneal lymphadenopathy. --No mesenteric lymphadenopathy. --No pelvic or inguinal lymphadenopathy. Reproductive: Status post hysterectomy. No adnexal mass. Other: There is a small amount of free fluid in the upper abdomen. The abdominal wall is normal. Musculoskeletal. No acute displaced fractures. IMPRESSION: 1. There are ground-glass airspace opacities at the lung bases medially, greatest at the right lung base. Differential considerations include infection and inflammation. 2. Rectosigmoid diverticulosis without evidence of acute diverticulitis. 3. Small amount of free fluid in the upper abdomen. 4. Aortic Atherosclerosis  (ICD10-I70.0). Electronically Signed   By: Constance Holster M.D.   On: 03/24/2019 18:33   DG Chest 2 View  Result Date: 04/08/2019 CLINICAL DATA:  Dyspnea EXAM: CHEST - 2 VIEW COMPARISON:  03/24/2019 FINDINGS: Cardiomegaly. Vascular congestion that appears similar to prior. No visible effusion or pneumothorax. IMPRESSION: Cardiomegaly and vascular congestion. Electronically Signed   By: Monte Fantasia M.D.   On: 04/08/2019 07:40   DG Chest 2 View  Result Date: 03/24/2019 CLINICAL DATA:  64 year old female with history of shortness of breath. Tested positive for COVID 19 a week or 2 ago. EXAM: CHEST - 2 VIEW COMPARISON:  Chest x-ray 03/18/2019. FINDINGS: Ill-defined opacities and areas of interstitial prominence noted throughout the mid to lower lungs bilaterally, suggesting mild multilobar bilateral pneumonia. No pleural effusions. No evidence of pulmonary edema. Mild to moderate cardiomegaly. Upper mediastinal contours are within normal limits. Aortic atherosclerosis. IMPRESSION: 1. The appearance of the chest is compatible with mild multilobar pneumonia, with a spectrum of findings commonly seen in the setting of COVID-19 infection. 2. Mild to moderate cardiomegaly. 3. Aortic atherosclerosis. Electronically Signed   By: Vinnie Langton M.D.   On: 03/24/2019 15:15   DG Chest 2 View  Result Date: 03/18/2019 CLINICAL DATA:  Shortness of breath EXAM: CHEST - 2 VIEW COMPARISON:  03/06/2019 FINDINGS: The heart size remains enlarged. Chronic bronchitic changes are again noted bilaterally. There is no large focal infiltrate. No pneumothorax. Subtle peripheral airspace opacities are again noted but are improved from prior study. There may be some vascular congestion without overt pulmonary edema. IMPRESSION: 1. Subtle but improved bilateral airspace opacities. 2. Cardiomegaly with vascular congestion. Electronically Signed   By: Constance Holster M.D.   On: 03/18/2019 03:36    EKG: SR with RBB    ASSESSMENT AND PLAN:   1. Acute on chronic combine systolic and diastolic HF, recent Echo from 01/2019 reveals severely depressed LV function with an EF estimated at <20%.  -Agree with IV diuresis, continuous cardiac monitoring, 1200cc fluid restriction and salt restriction  -Continue at home dosages of Entresto, spironolactone and coreg  -Strict I&O's  -Agree with admission to telemetry  -Consider ICD as outpatient   2. Hypertensive urgency, likely due to demand ischemia and coicaine use, currently resolved, patient is now normotensive at 129/89  - Continue management with at home dosages of coreg and entresto   3. Elevated troponin likely due to demand ischemia, recent Cardiac Cath in 12/2018 revealed mild disease in RCA and Cx  -  agree with serial high sensitivity troponins  4. H/o Cardiac thrombus  - Continue Xarelto   5. CKD with BUN=35 and crea 2.18  -Agree with Nephrology consult    Signed: Renezmae Canlas [id number]-AG 04/08/2019, 4:52 PM

## 2019-04-08 NOTE — ED Notes (Signed)
Date and time results received: 04/08/19 8:36 AM  (use smartphrase ".now" to insert current time)  Test: Troponin Critical Value: 100  Name of Provider Notified: Dr. Jacqualine Code  Orders Received? Or Actions Taken?: No new orders at this time

## 2019-04-08 NOTE — ED Notes (Signed)
Messaged NP regarding patient's anxiety and continued complaints of belly pain. Patient keeps calling out saying "jesus help me". Refuses trazadone

## 2019-04-08 NOTE — H&P (Signed)
History and Physical    Katelyn Lane QQV:[id number] DOB: Jul 31, 1955 DOA: 04/08/2019  PCP: [name], MD   Patient coming from: Home  I have personally briefly reviewed patient's old medical records in [place]  Chief Complaint: Shortness of breath  HPI: Katelyn Lane is a 64 y.o. female with medical history significant for chronic combined systolic and diastolic CHF as well as recent COVID-19 infection with admission at [facility] for acute respiratory failure secondary to both COVID-19 pneumonia and acute on chronic CHF followed by 2 other hospitalizations here for the same from 2/26-3/2 and 03/16 to 03/ 20.  She presents to the emergency room with acute onset of worsening redness of breath and orthopnea with paroxysmal nocturnal dyspnea without worsening lower extremity edema.  She admitted to wheezing and excessive cough with epigastric and periumbilical abdominal pain.  She had nausea and vomiting yesterday without diarrhea.  No bilious vomitus or hematemesis.  No melena or bright red bleeding per rectum.  She denies having any fever or chills, no cough, no dizziness or mental status changes. She denies having any urinary symptoms. Her blood pressure was significantly elevated upon arrival to the emergency room 142/108    ED Course:  Patient presents with dyspnea.  She currently reports compliance with Lasix and Xarelto.  She has a history of demand ischemia with previous admission, also she reports abdominal pain similar to her previous hospitalization.  She did have this on her previous admission as well and reports she is had the same with her previous admissions. Lab work including significant elevated BNP as well as elevated troponin concerning for volume overload with demand ischemia.  Patient is chest pain-free at the moment.  She is anticoagulated, reporting that she is compliant with her medications.   Dr. Clayborn Bigness from cardiology is currently in the room seeing the  patient.  Agrees with diuresis.   Review of Systems: As per HPI otherwise 10 point review of systems negative.    Past Medical History:  Diagnosis Date  . CHF (congestive heart failure) ([place])   . COVID-19   . Hyperlipidemia   . Renal disorder     Past Surgical History:  Procedure Laterality Date  . RIGHT/LEFT HEART CATH AND CORONARY ANGIOGRAPHY N/A 12/30/2018   Procedure: RIGHT/LEFT HEART CATH AND CORONARY ANGIOGRAPHY;  Surgeon: Corey Skains, MD;  Location: [place] [facility];  Service: Cardiovascular;  Laterality: N/A;     reports that she has been smoking. She has never used smokeless tobacco. She reports current drug use. Drug: Cocaine. She reports that she does not drink alcohol.  Allergies  Allergen Reactions  . Penicillins Hives, Rash and Other (See Comments)    Did it involve swelling of the face/tongue/throat, SOB, or low BP? No Did it involve sudden or severe rash/hives, skin peeling, or any reaction on the inside of your mouth or nose? Yes Did you need to seek medical attention at a hospital or doctor's office? Yes When did it last happen? >25 years If all above answers are "NO", may proceed with cephalosporin use.   . Ace Inhibitors Nausea And Vomiting    Family History  Problem Relation Age of Onset  . Breast cancer Neg Hx      Prior to Admission medications   Medication Sig Start Date End Date Taking? Authorizing Provider  dicyclomine (BENTYL) 10 MG capsule Take 10 mg by mouth 3 (three) times daily as needed. 02/22/19  Yes [provider]  albuterol (VENTOLIN HFA)  108 (90 Base) MCG/ACT inhaler Inhale 2 puffs into the lungs every 6 (six) hours as needed for wheezing or shortness of breath. 03/28/19   Enzo Bi, MD  alum & mag hydroxide-simeth (MAALOX/MYLANTA) 200-200-20 MG/5ML suspension Take 30 mLs by mouth every 4 (four) hours as needed for indigestion or heartburn. 02/16/19   Dhungel, Flonnie Overman, MD  atorvastatin (LIPITOR) 20 MG tablet Take  1 tablet (20 mg total) by mouth daily at 6 PM. 03/28/19 06/26/19  Enzo Bi, MD  carvedilol (COREG) 3.125 MG tablet Take 1 tablet (3.125 mg total) by mouth 2 (two) times daily with a meal. 03/28/19 06/26/19  Enzo Bi, MD  docusate calcium (SURFAK) 240 MG capsule Take 240 mg by mouth as needed.    [provider]  FLUoxetine (PROZAC) 40 MG capsule Take 1 capsule (40 mg total) by mouth daily. 03/28/19 06/26/19  Enzo Bi, MD  furosemide (LASIX) 40 MG tablet Take 2 tablets (80 mg total) by mouth daily. 03/28/19 06/26/19  Enzo Bi, MD  loratadine (CLARITIN) 10 MG tablet Take 10 mg by mouth daily.    [provider]  nitroGLYCERIN (NITROSTAT) 0.4 MG SL tablet Place 1 tablet (0.4 mg total) under the tongue every 5 (five) minutes as needed for chest pain. 02/16/19   Dhungel, Nishant, MD  polyethylene glycol (MIRALAX) 17 g packet Take 17 g by mouth daily. 02/21/19   Nance Pear, MD  rivaroxaban (XARELTO) 20 MG TABS tablet Take 1 tablet (20 mg total) by mouth daily with supper. For the blood clot in your heart. 03/28/19 06/26/19  Enzo Bi, MD  sacubitril-valsartan (ENTRESTO) 24-26 MG Take 1 tablet by mouth 2 (two) times daily. 03/28/19 06/26/19  Enzo Bi, MD  spironolactone (ALDACTONE) 25 MG tablet Take 0.5 tablets (12.5 mg total) by mouth daily. 03/28/19 06/26/19  Enzo Bi, MD  traZODone (DESYREL) 100 MG tablet Take 150 mg by mouth at bedtime as needed for sleep.  09/08/13   [provider]  zinc sulfate 220 (50 Zn) MG capsule Take 1 capsule (220 mg total) by mouth daily. Patient not taking: Reported on 03/24/2019 03/10/19   Max Sane, MD    Physical Exam: Vitals:   04/08/19 0717 04/08/19 0830 04/08/19 0900 04/08/19 0912  BP:   (!) 142/108   Pulse: 84 77  74  Resp: 20 (!) 24 (!) 30 (!) 26  Temp:      TempSrc:      SpO2: 99% 99%  100%  Weight:      Height:         Vitals:   04/08/19 0717 04/08/19 0830 04/08/19 0900 04/08/19 0912  BP:   (!) 142/108   Pulse: 84 77  74  Resp:  20 (!) 24 (!) 30 (!) 26  Temp:      TempSrc:      SpO2: 99% 99%  100%  Weight:      Height:        Constitutional: NAD, alert and oriented x 3.  Very tearful Eyes: PERRL, lids and conjunctivae normal ENMT: Mucous membranes are moist.  Neck: normal, supple, no masses, no thyromegaly Respiratory Decreased breath sounds at the bases, no wheezing, no crackles. Normal respiratory effort. No accessory muscle use.  Cardiovascular: Regular rate and rhythm, no murmurs / rubs / gallops. No extremity edema. 2+ pedal pulses. No carotid bruits.  Abdomen: no tenderness, no masses palpated. No hepatosplenomegaly. Bowel sounds positive.  Central adiposity Musculoskeletal: no clubbing / cyanosis. No joint deformity upper and lower extremities.  Skin: no rashes, lesions, ulcers.  Neurologic: No gross focal neurologic deficit. Psychiatric: Flat affect, very tearful   Labs on Admission: I have personally reviewed following labs and imaging studies  CBC: Recent Labs  Lab 04/08/19 0737  WBC 3.7*  HGB 11.4*  HCT 38.2  MCV 80.4  PLT 716   Basic Metabolic Panel: Recent Labs  Lab 04/08/19 0737  NA 141  K 3.9  CL 109  CO2 22  GLUCOSE 114*  BUN 35*  CREATININE 2.09*  CALCIUM 9.2   GFR: Estimated Creatinine Clearance: 33.8 mL/min (A) (by C-G formula based on SCr of 2.09 mg/dL (H)). Liver Function Tests: Recent Labs  Lab 04/08/19 0737  AST 35  ALT 30  ALKPHOS 135*  BILITOT 1.0  PROT 6.7  ALBUMIN 3.3*   Recent Labs  Lab 04/08/19 0737  LIPASE 29   No results for input(s): AMMONIA in the last 168 hours. Coagulation Profile: Recent Labs  Lab 04/08/19 0851  INR 2.5*   Cardiac Enzymes: No results for input(s): CKTOTAL, CKMB, CKMBINDEX, TROPONINI in the last 168 hours. BNP (last 3 results) No results for input(s): PROBNP in the last 8760 hours. HbA1C: No results for input(s): HGBA1C in the last 72 hours. CBG: No results for input(s): GLUCAP in the last 168 hours. Lipid  Profile: No results for input(s): CHOL, HDL, LDLCALC, TRIG, CHOLHDL, LDLDIRECT in the last 72 hours. Thyroid Function Tests: No results for input(s): TSH, T4TOTAL, FREET4, T3FREE, THYROIDAB in the last 72 hours. Anemia Panel: No results for input(s): VITAMINB12, FOLATE, FERRITIN, TIBC, IRON, RETICCTPCT in the last 72 hours. Urine analysis:    Component Value Date/Time   COLORURINE YELLOW (A) 03/24/2019 2009   APPEARANCEUR CLEAR (A) 03/24/2019 2009   LABSPEC 1.012 03/24/2019 2009   PHURINE 6.0 03/24/2019 2009   GLUCOSEU NEGATIVE 03/24/2019 2009   HGBUR NEGATIVE 03/24/2019 2009   BILIRUBINUR NEGATIVE 03/24/2019 2009   [place] NEGATIVE 03/24/2019 2009   PROTEINUR 100 (A) 03/24/2019 2009   NITRITE NEGATIVE 03/24/2019 2009   LEUKOCYTESUR NEGATIVE 03/24/2019 2009    Radiological Exams on Admission: DG Chest 2 View  Result Date: 04/08/2019 CLINICAL DATA:  Dyspnea EXAM: CHEST - 2 VIEW COMPARISON:  03/24/2019 FINDINGS: Cardiomegaly. Vascular congestion that appears similar to prior. No visible effusion or pneumothorax. IMPRESSION: Cardiomegaly and vascular congestion. Electronically Signed   By: Monte Fantasia M.D.   On: 04/08/2019 07:40    EKG: Independently reviewed.  Normal sinus rhythm, right bundle branch block appearance though somewhat atypical in nature Probable old inferior Q waves and LVH   Assessment/Plan Principal Problem:   Acute on chronic combined systolic and diastolic CHF (congestive heart failure) (HCC) Active Problems:   Dilated cardiomyopathy (HCC)   Depression   LV (left ventricular) mural thrombus   CKD (chronic kidney disease), stage IIIa    1.  Acute on chronic combined systolic and diastolic CHF  Patient presents for evaluation of acute onset shortness of breath. She has a last known LVEF of less than 20%  as well is grade 3 diastolic dysfunction CXR shows cardiomegaly with vascular congestion Place patient on IV diuresis Continue Aldactone, Entresto  and carvedilol Request cardiology consult Continue low-sodium diet and fluid restriction   2.  Hypertensive urgency. This could be contributing to her acute CHF. Optimize blood pressure control    3.  Elevated troponin I. Was likely secondary to demand ischemia from acute CHF Obtain serial troponin levels    4.  Depression. Patient appears very depressed and  is very tearful but denies having any suicidal ideations. She states that she uses drugs because she feels all alone and she has a lot of stressors including not having a place to stay Will continue Prozac. Request psychiatry evaluation   5. History of cardiac thrombus Continue anticoagulation with Xarelto   6.  Acute worsening of chronic kidney disease stage III Patient has a baseline serum creatinine of 1.6 and on admission it was 2.09 ?? Cardio renal syndrome Will request nephrology consult   DVT prophylaxis: Xarelto Code Status: Full Family Communication: Plan of care was discussed with patient at the bedside. She  verbalizes understanding and agrees with the plan Disposition Plan: Back to previous home environment Consults called: Cardiology, nephrology, psychiatry, case management    Nalini Alcaraz MD [facility]     04/08/2019, 9:30 AM

## 2019-04-08 NOTE — ED Notes (Signed)
Pt ambulated to bathroom unassisted with steady gait.

## 2019-04-08 NOTE — ED Notes (Signed)
Pt ambulated to bathroom with steady gait. 

## 2019-04-08 NOTE — ED Provider Notes (Signed)
[facility] Emergency Department Provider Note   ____________________________________________   First MD Initiated Contact with Patient 04/08/19 [id number]     (approximate)  I have reviewed the triage vital signs and the nursing notes.   HISTORY  Chief Complaint Shortness of Breath and Abdominal Pain    HPI Katelyn Lane is a 64 y.o. female here for evaluation for shortness of breath and abdominal pain  Patient reports the same thing is happened to her twice in about the last month.  One time she was diagnosed with COVID-19.  She reports that they have been trying to get fluid off of her, she has been started to feel short of breath once again for the last 3 days.  She does not see any swelling in her legs though.  She does get short of breath especially at night  She has not been coughing.  No fevers.  She also gets pain like a tightening or full feeling in her upper abdomen.  Denies chest pain.  Does however feel short of breath.  Reports she is taking Lasix twice a day.  She is supposed to see a cardiologist at [facility] next week  She has been taking her blood thinner Xarelto   Past Medical History:  Diagnosis Date  . CHF (congestive heart failure) ([place])   . COVID-19   . Hyperlipidemia   . Renal disorder     Patient Active Problem List   Diagnosis Date Noted  . Acute on chronic combined systolic and diastolic CHF (congestive heart failure) ([place]) 03/24/2019  . COVID-19 03/06/2019  . Acute respiratory failure with hypoxia ([place]) 03/06/2019  . Hypokalemia 03/06/2019  . LV (left ventricular) mural thrombus 03/06/2019  . CKD (chronic kidney disease), stage IIIa 03/06/2019  . Nausea and vomiting 02/16/2019  . Abdominal pain 02/16/2019  . Rhabdomyolysis 02/16/2019  . Acute renal failure superimposed on stage 3 chronic kidney disease ([place]) 02/16/2019  . Cocaine use 02/16/2019  . Tobacco abuse 02/16/2019  . Acute on chronic systolic CHF (congestive  heart failure) ([place]) 02/16/2019  . Acute congestive heart failure ([place]) 02/13/2019  . Respiratory failure with hypoxia ([place]) 01/31/2019  . Stroke ([place]) 01/18/2019  . Dilated cardiomyopathy ([place]) 12/31/2018  . Chest tightness 12/31/2018  . Elevated troponin 12/31/2018  . Hypertensive urgency 12/31/2018  . Dyslipidemia 12/31/2018  . Depression 12/31/2018  . HFrEF (heart failure with reduced ejection fraction) ([place]) 12/29/2018    Past Surgical History:  Procedure Laterality Date  . RIGHT/LEFT HEART CATH AND CORONARY ANGIOGRAPHY N/A 12/30/2018   Procedure: RIGHT/LEFT HEART CATH AND CORONARY ANGIOGRAPHY;  Surgeon: Corey Skains, MD;  Location: [place] [facility];  Service: Cardiovascular;  Laterality: N/A;    Prior to Admission medications   Medication Sig Start Date End Date Taking? Authorizing Provider  dicyclomine (BENTYL) 10 MG capsule Take 10 mg by mouth 3 (three) times daily as needed. 02/22/19  Yes [provider]  albuterol (VENTOLIN HFA) 108 (90 Base) MCG/ACT inhaler Inhale 2 puffs into the lungs every 6 (six) hours as needed for wheezing or shortness of breath. 03/28/19   Enzo Bi, MD  alum & mag hydroxide-simeth (MAALOX/MYLANTA) 200-200-20 MG/5ML suspension Take 30 mLs by mouth every 4 (four) hours as needed for indigestion or heartburn. 02/16/19   Dhungel, Flonnie Overman, MD  atorvastatin (LIPITOR) 20 MG tablet Take 1 tablet (20 mg total) by mouth daily at 6 PM. 03/28/19 06/26/19  Enzo Bi, MD  carvedilol (COREG) 3.125 MG tablet Take 1 tablet (3.125  mg total) by mouth 2 (two) times daily with a meal. 03/28/19 06/26/19  Enzo Bi, MD  docusate calcium (SURFAK) 240 MG capsule Take 240 mg by mouth as needed.    [provider]  FLUoxetine (PROZAC) 40 MG capsule Take 1 capsule (40 mg total) by mouth daily. 03/28/19 06/26/19  Enzo Bi, MD  furosemide (LASIX) 40 MG tablet Take 2 tablets (80 mg total) by mouth daily. 03/28/19 06/26/19  Enzo Bi, MD  loratadine (CLARITIN) 10 MG  tablet Take 10 mg by mouth daily.    [provider]  nitroGLYCERIN (NITROSTAT) 0.4 MG SL tablet Place 1 tablet (0.4 mg total) under the tongue every 5 (five) minutes as needed for chest pain. 02/16/19   Dhungel, Nishant, MD  polyethylene glycol (MIRALAX) 17 g packet Take 17 g by mouth daily. 02/21/19   Nance Pear, MD  rivaroxaban (XARELTO) 20 MG TABS tablet Take 1 tablet (20 mg total) by mouth daily with supper. For the blood clot in your heart. 03/28/19 06/26/19  Enzo Bi, MD  sacubitril-valsartan (ENTRESTO) 24-26 MG Take 1 tablet by mouth 2 (two) times daily. 03/28/19 06/26/19  Enzo Bi, MD  spironolactone (ALDACTONE) 25 MG tablet Take 0.5 tablets (12.5 mg total) by mouth daily. 03/28/19 06/26/19  Enzo Bi, MD  traZODone (DESYREL) 100 MG tablet Take 150 mg by mouth at bedtime as needed for sleep.  09/08/13   [provider]  zinc sulfate 220 (50 Zn) MG capsule Take 1 capsule (220 mg total) by mouth daily. Patient not taking: Reported on 03/24/2019 03/10/19   Max Sane, MD    Allergies Penicillins and Ace inhibitors  Family History  Problem Relation Age of Onset  . Breast cancer Neg Hx     Social History Social History   Tobacco Use  . Smoking status: Current Every Day Smoker  . Smokeless tobacco: Never Used  Substance Use Topics  . Alcohol use: No    Alcohol/week: 0.0 standard drinks  . Drug use: Yes    Types: Cocaine    Comment: 4-5 days ago    Review of Systems Constitutional: No fever/chills Eyes: No visual changes. ENT: No sore throat. Cardiovascular: Denies chest pain. Respiratory: See HPI Gastrointestinal: Not vomiting.  Decreased appetite.  For the last 3 days feeling a sense of fullness in her abdomen.  Reports she had the same the last 2 time she came to the ER Genitourinary: Negative for dysuria. Musculoskeletal: Negative for back pain. Skin: Negative for rash. Neurological: Negative for headaches, areas of focal weakness or  numbness.    ____________________________________________   PHYSICAL EXAM:  VITAL SIGNS: ED Triage Vitals  Enc Vitals Group     BP 04/08/19 0634 (!) 141/86     Pulse Rate 04/08/19 0634 76     Resp 04/08/19 0634 18     Temp 04/08/19 0634 (!) 97.5 F (36.4 C)     Temp Source 04/08/19 0634 Oral     SpO2 04/08/19 0634 100 %     Weight 04/08/19 0639 210 lb 0.7 oz (95.3 kg)     Height 04/08/19 0639 5\' 9"  (1.753 m)     Head Circumference --      Peak Flow --      Pain Score 04/08/19 0636 10     Pain Loc --      Pain Edu? --      Excl. in [place]? --     Constitutional: Alert and oriented. Well appearing and in no acute distress.  Eyes: Conjunctivae are normal. Head: Atraumatic. Nose: No congestion/rhinnorhea. Mouth/Throat: Mucous membranes are moist. Neck: No stridor.  Cardiovascular: Normal rate, regular rhythm. Grossly normal heart sounds.  Good peripheral circulation. Respiratory: Mild use accessory muscles.  Clear lungs bilateral.  No retractions.  Very slight tachypnea.  Speaks in phrases. Gastrointestinal: Soft and very mild tenderness throughout on abdominal exam, there is no focality or peritonitis. No distention. Musculoskeletal: No lower extremity tenderness nor edema. Neurologic:  Normal speech and language. No gross focal neurologic deficits are appreciated.  Skin:  Skin is warm, dry and intact. No rash noted. Psychiatric: Mood and affect are normal. Speech and behavior are normal.  ____________________________________________   LABS (all labs ordered are listed, but only abnormal results are displayed)  Labs Reviewed  CBC - Abnormal; Notable for the following components:      Result Value   WBC 3.7 (*)    Hemoglobin 11.4 (*)    MCH 24.0 (*)    MCHC 29.8 (*)    RDW 19.5 (*)    All other components within normal limits  BRAIN NATRIURETIC PEPTIDE - Abnormal; Notable for the following components:   B Natriuretic Peptide >4,500.0 (*)    All other components  within normal limits  COMPREHENSIVE METABOLIC PANEL - Abnormal; Notable for the following components:   Glucose, Bld 114 (*)    BUN 35 (*)    Creatinine, Ser 2.09 (*)    Albumin 3.3 (*)    Alkaline Phosphatase 135 (*)    GFR calc non Af Amer 25 (*)    GFR calc Af Amer 28 (*)    All other components within normal limits  TROPONIN I (HIGH SENSITIVITY) - Abnormal; Notable for the following components:   Troponin I (High Sensitivity) 100 (*)    All other components within normal limits  LIPASE, BLOOD  URINE DRUG SCREEN, QUALITATIVE ([facility] ONLY)  PROTIME-INR   ____________________________________________  EKG  Reviewed inter by me at 8 AM Heart rate 75 QRS 140 QTc 520 Normal sinus rhythm, right bundle branch block appearance though somewhat atypical in nature Probable old inferior Q waves and LVH ____________________________________________  RADIOLOGY  DG Chest 2 View  Result Date: 04/08/2019 CLINICAL DATA:  Dyspnea EXAM: CHEST - 2 VIEW COMPARISON:  03/24/2019 FINDINGS: Cardiomegaly. Vascular congestion that appears similar to prior. No visible effusion or pneumothorax. IMPRESSION: Cardiomegaly and vascular congestion. Electronically Signed   By: Monte Fantasia M.D.   On: 04/08/2019 07:40    Chest x-ray reviewed.  Cardiomegaly and vascular congestion.  Similar to previous ____________________________________________   PROCEDURES  Procedure(s) performed: None  Procedures  Critical Care performed: No  ____________________________________________   INITIAL IMPRESSION / ASSESSMENT AND PLAN / ED COURSE  Pertinent labs & imaging results that were available during my care of the patient were reviewed by me and considered in my medical decision making (see chart for details).     Patient presents with dyspnea.  She is currently reports compliance with Lasix and Xarelto.  She has a history of demand ischemia with previous admission, also she reports abdominal pain similar  to her previous.  She did have this on her previous admission as well and reports she is had the same with her previous admissions.  Lab work including significant elevated BNP as well as elevated troponin concerning for volume overload with demand ischemia.  Patient is chest pain-free at the moment.  She is anticoagulated, reporting that she is compliant with her medications.  I am a little  concerned however that she may not be wholly compliant.  Dr. Clayborn Bigness from cardiology is currently in the room seeing the patient.  Agrees with diuresis.  Patient will be admitted for further care and management under the hospitalist service.  Patient agreeable with plan.  Repeat Covid testing does not appear to be indicated, she recently had coronavirus infection, had recovery, is now representing with dyspnea        ____________________________________________   FINAL CLINICAL IMPRESSION(S) / ED DIAGNOSES  Final diagnoses:  Acute on chronic combined systolic and diastolic congestive heart failure (HCC)  Pain, abdominal, nonspecific        Note:  This document was prepared using Dragon voice recognition software and may include unintentional dictation errors       Delman Kitten, MD 04/08/19 [phone]

## 2019-04-08 NOTE — ED Notes (Signed)
Pt sitting in bed in NAD with equal expansion of chest stating that "my breathing is not good". Pt asking that I put her on oxygen. This RN explained to pt that her O2 saturation is 100% but pt demands that oxygen be placed on. Placed on 2L [place] by this RN

## 2019-04-08 NOTE — ED Notes (Signed)
Pt transported to xray 

## 2019-04-08 NOTE — ED Notes (Signed)
Pt assisted to bathroom. Pt ambulatory with steady gait.

## 2019-04-08 NOTE — ED Notes (Signed)
Pt had 5 beats run of [place]. Pt was resting comfortably with no complaints. Pt back in NSR with few bigeminy PVCs. MD made aware. Will continue to monitor.

## 2019-04-08 NOTE — ED Notes (Signed)
Psych at bedside to evaluate.

## 2019-04-08 NOTE — ED Notes (Signed)
Pt given meal tray.

## 2019-04-08 NOTE — ED Notes (Signed)
Cardiologist at bedside.  

## 2019-04-08 NOTE — ED Triage Notes (Addendum)
Pt arrived via POV with reports of shortness of breath and stomach pain "again" Sxs started 3 days ago. Pt also c/o N/D.  Pt dx with COVID in Feb 2021 has hx of CHF   Pt ambulatory in triage, mask in place.

## 2019-04-09 ENCOUNTER — Encounter: Payer: Self-pay | Admitting: Internal Medicine

## 2019-04-09 DIAGNOSIS — I5043 Acute on chronic combined systolic (congestive) and diastolic (congestive) heart failure: Secondary | ICD-10-CM

## 2019-04-09 LAB — BASIC METABOLIC PANEL
Anion gap: 9 (ref 5–15)
BUN: 40 mg/dL — ABNORMAL HIGH (ref 8–23)
CO2: 25 mmol/L (ref 22–32)
Calcium: 8.8 mg/dL — ABNORMAL LOW (ref 8.9–10.3)
Chloride: 107 mmol/L (ref 98–111)
Creatinine, Ser: 2.34 mg/dL — ABNORMAL HIGH (ref 0.44–1.00)
GFR calc Af Amer: 25 mL/min — ABNORMAL LOW (ref >60)
GFR calc non Af Amer: 21 mL/min — ABNORMAL LOW (ref >60)
Glucose, Bld: 79 mg/dL (ref 70–99)
Potassium: 3.4 mmol/L — ABNORMAL LOW (ref 3.5–5.1)
Sodium: 141 mmol/L (ref 135–145)

## 2019-04-09 MED ORDER — FUROSEMIDE 40 MG PO TABS
80.0000 mg | ORAL_TABLET | Freq: Every day | ORAL | Status: DC
  Administered 2019-04-09: 80 mg via ORAL
  Filled 2019-04-09: qty 2

## 2019-04-09 NOTE — Consult Note (Signed)
[facility] Face-to-Face Psychiatry Consult   Reason for Consult: Depression Referring Physician:  Dr. Jacqualine Code  Patient Identification: Katelyn Lane MRN:  [id number] Principal Diagnosis: Acute on chronic combined systolic and diastolic CHF (congestive heart failure) ([place]) Diagnosis:  Principal Problem:   Acute on chronic combined systolic and diastolic CHF (congestive heart failure) ([place]) Active Problems:   Dilated cardiomyopathy (HCC)   Depression   Cocaine use   Tobacco abuse   LV (left ventricular) mural thrombus   CKD (chronic kidney disease), stage IIIa   Acute combined systolic and diastolic CHF, NYHA class 1 (HCC)   Total Time spent with patient: 30 minutes  Subjective: "I am depressed because I have nowhere to live and no one is there for me." Katelyn Lane is a 64 y.o. female patient presented to [facility] ED via POV with reports of shortness of breath and stomach pain. The patient was seen for psychiatric assessment due to her voicing she is depressed.  The patient currently takes Prozac 40 mg daily.  The patient states she has no family, a place to live, and her illness has gotten worse. The patient voiced she currently lives with a friend but has to be out of his house in the next few weeks. The patient states, "I am so overwhelmed I do not know what I am going to do. It was discussed with the patient that her depression is more situational due to her having to deal with all that she has to deal with, which makes treating her depression difficult. Until she can find a place to live, health begins to get better, and she can surround herself with family and friends. The patient will continue to battle depression.  The patient is currently using cocaine which she voiced last use four days ago.  The patient was encouraged to meet with a therapist.  Her therapist will be someone that she can continuously communicate with to help her with her depression.  The patient would have someone to share her  feelings.  The patient was seen face-to-face by this provider; the chart was reviewed on 04/09/2019 due to the patient's care. During her evaluation, she is alert and oriented x 4, anxious but cooperative, and mood-congruent with affect.  The patient does not appear to be responding to internal or external stimuli. Neither is the patient presenting with any delusional thinking. The patient denies auditory or visual hallucinations. The patient denies suicidal, homicidal, or self-harm ideations. The patient is not presenting with any psychotic or paranoid behaviors. During an encounter with the patient, he/she was able to answer questions appropriately.  Plan: The patient is not a safety risk to self or others and does not require psychiatric inpatient admission for stabilization and treatment.  HPI: Per Dr. Jacqualine Code: Katelyn Lane is a 64 y.o. female here for evaluation for shortness of breath and abdominal pain Patient reports the same thing is happened to her twice in about the last month.  One time she was diagnosed with COVID-19.  She reports that they have been trying to get fluid off of her, she has been started to feel short of breath once again for the last 3 days.  She does not see any swelling in her legs though.  She does get short of breath especially at night  She has not been coughing.  No fevers.  She also gets pain like a tightening or full feeling in her upper abdomen.  Denies chest pain.  Does however feel short of breath.  Reports she is taking Lasix twice a day.  She is supposed to see a cardiologist at [facility] next week  She has been taking her blood thinner Xarelto  Past Psychiatric History:  Depression Tobacco abuse Cocaine use  Risk to Self:   No Risk to Others:   No Prior Inpatient Therapy:   No Prior Outpatient Therapy:   Yes  Past Medical History:  Past Medical History:  Diagnosis Date  . CHF (congestive heart failure) ([place])   . COVID-19   .  Hyperlipidemia   . Renal disorder     Past Surgical History:  Procedure Laterality Date  . RIGHT/LEFT HEART CATH AND CORONARY ANGIOGRAPHY N/A 12/30/2018   Procedure: RIGHT/LEFT HEART CATH AND CORONARY ANGIOGRAPHY;  Surgeon: Corey Skains, MD;  Location: [facility];  Service: Cardiovascular;  Laterality: N/A;   Family History:  Family History  Problem Relation Age of Onset  . Breast cancer Neg Hx    Family Psychiatric  History: Unknown Social History:  Social History   Substance and Sexual Activity  Alcohol Use No  . Alcohol/week: 0.0 standard drinks     Social History   Substance and Sexual Activity  Drug Use Yes  . Types: Cocaine   Comment: 4-5 days ago    Social History   Socioeconomic History  . Marital status: Single    Spouse name: Not on file  . Number of children: Not on file  . Years of education: Not on file  . Highest education level: Not on file  Occupational History  . Not on file  Tobacco Use  . Smoking status: Current Every Day Smoker  . Smokeless tobacco: Never Used  Substance and Sexual Activity  . Alcohol use: No    Alcohol/week: 0.0 standard drinks  . Drug use: Yes    Types: Cocaine    Comment: 4-5 days ago  . Sexual activity: Not on file  Other Topics Concern  . Not on file  Social History Narrative  . Not on file   Social Determinants of Health   Financial Resource Strain:   . Difficulty of Paying Living Expenses:   Food Insecurity:   . Worried About Charity fundraiser in the Last Year:   . Arboriculturist in the Last Year:   Transportation Needs:   . Film/video editor (Medical):   Marland Kitchen Lack of Transportation (Non-Medical):   Physical Activity:   . Days of Exercise per Week:   . Minutes of Exercise per Session:   Stress:   . Feeling of Stress :   Social Connections:   . Frequency of Communication with Friends and Family:   . Frequency of Social Gatherings with Friends and Family:   . Attends Religious  Services:   . Active Member of Clubs or Organizations:   . Attends Archivist Meetings:   Marland Kitchen Marital Status:    Additional Social History:    Allergies:   Allergies  Allergen Reactions  . Penicillins Hives, Rash and Other (See Comments)    Did it involve swelling of the face/tongue/throat, SOB, or low BP? No Did it involve sudden or severe rash/hives, skin peeling, or any reaction on the inside of your mouth or nose? Yes Did you need to seek medical attention at a hospital or doctor's office? Yes When did it last happen? >25 years If all above answers are "NO", may proceed with cephalosporin use.   . Ace Inhibitors Nausea And Vomiting  Labs:  Results for orders placed or performed during the hospital encounter of 04/08/19 (from the past 48 hour(s))  CBC     Status: Abnormal   Collection Time: 04/08/19  7:37 AM  Result Value Ref Range   WBC 3.7 (L) 4.0 - 10.5 K/uL   RBC 4.75 3.87 - 5.11 MIL/uL   Hemoglobin 11.4 (L) 12.0 - 15.0 g/dL   HCT 38.2 36.0 - 46.0 %   MCV 80.4 80.0 - 100.0 fL   MCH 24.0 (L) 26.0 - 34.0 pg   MCHC 29.8 (L) 30.0 - 36.0 g/dL   RDW 19.5 (H) 11.5 - 15.5 %   Platelets 353 150 - 400 K/uL   nRBC 0.0 0.0 - 0.2 %    Comment: Performed at [facility], [address] [place], [place] [address]  Brain natriuretic peptide     Status: Abnormal   Collection Time: 04/08/19  7:37 AM  Result Value Ref Range   B Natriuretic Peptide >4,500.0 (H) 0.0 - 100.0 pg/mL    Comment: Performed at [facility], [address] [place], [place] [address]  Comprehensive metabolic panel     Status: Abnormal   Collection Time: 04/08/19  7:37 AM  Result Value Ref Range   Sodium 141 135 - 145 mmol/L   Potassium 3.9 3.5 - 5.1 mmol/L   Chloride 109 98 - 111 mmol/L   CO2 22 22 - 32 mmol/L   Glucose, Bld 114 (H) 70 - 99 mg/dL    Comment: Glucose reference range applies only to samples taken after fasting for at least 8 hours.   BUN 35 (H) 8 - 23  mg/dL   Creatinine, Ser 2.09 (H) 0.44 - 1.00 mg/dL   Calcium 9.2 8.9 - 10.3 mg/dL   Total Protein 6.7 6.5 - 8.1 g/dL   Albumin 3.3 (L) 3.5 - 5.0 g/dL   AST 35 15 - 41 U/L   ALT 30 0 - 44 U/L   Alkaline Phosphatase 135 (H) 38 - 126 U/L   Total Bilirubin 1.0 0.3 - 1.2 mg/dL   GFR calc non Af Amer 25 (L) >60 mL/min   GFR calc Af Amer 28 (L) >60 mL/min   Anion gap 10 5 - 15    Comment: Performed at [facility], [address] [place], [place] [address]  Lipase, blood     Status: None   Collection Time: 04/08/19  7:37 AM  Result Value Ref Range   Lipase 29 11 - 51 U/L    Comment: Performed at [facility], [address] [place], [place] [address]  Troponin I (High Sensitivity)     Status: Abnormal   Collection Time: 04/08/19  7:37 AM  Result Value Ref Range   Troponin I (High Sensitivity) 100 (HH) <18 ng/L    Comment: CRITICAL RESULT CALLED TO, READ BACK BY AND VERIFIED WITH  KELLY GOODIN AT 7741 04/08/19 SDR (NOTE) Elevated high sensitivity troponin I (hsTnI) values and significant  changes across serial measurements may suggest ACS but many other  chronic and acute conditions are known to elevate hsTnI results.  Refer to the "Links" section for chest pain algorithms and additional  guidance. Performed at [facility], [address] [place],  [address]   Urine Drug Screen, [facility] only)     Status: Abnormal   Collection Time: 04/08/19  8:51 AM  Result Value Ref Range   Tricyclic, Ur Screen NONE DETECTED NONE DETECTED   Amphetamines, Ur  Screen NONE DETECTED NONE DETECTED   MDMA (Ecstasy)Ur Screen NONE DETECTED NONE DETECTED   Cocaine Metabolite,Ur [place] POSITIVE (A) NONE DETECTED   Opiate, Ur Screen NONE DETECTED NONE DETECTED   Phencyclidine (PCP) Ur S NONE DETECTED NONE DETECTED   Cannabinoid 50 Ng, Ur [place] NONE DETECTED NONE DETECTED   Barbiturates, Ur Screen NONE DETECTED NONE DETECTED   Benzodiazepine, Ur Scrn NONE DETECTED  NONE DETECTED   Methadone Scn, Ur NONE DETECTED NONE DETECTED    Comment: (NOTE) Tricyclics + metabolites, urine    Cutoff 1000 ng/mL Amphetamines + metabolites, urine  Cutoff 1000 ng/mL MDMA (Ecstasy), urine              Cutoff 500 ng/mL Cocaine Metabolite, urine          Cutoff 300 ng/mL Opiate + metabolites, urine        Cutoff 300 ng/mL Phencyclidine (PCP), urine         Cutoff 25 ng/mL Cannabinoid, urine                 Cutoff 50 ng/mL Barbiturates + metabolites, urine  Cutoff 200 ng/mL Benzodiazepine, urine              Cutoff 200 ng/mL Methadone, urine                   Cutoff 300 ng/mL The urine drug screen provides only a preliminary, unconfirmed analytical test result and should not be used for non-medical purposes. Clinical consideration and professional judgment should be applied to any positive drug screen result due to possible interfering substances. A more specific alternate chemical method must be used in order to obtain a confirmed analytical result. Gas chromatography / mass spectrometry (GC/MS) is the preferred confirmat ory method. Performed at [facility], [address] [place], [place] [address]   Protime-INR     Status: Abnormal   Collection Time: 04/08/19  8:51 AM  Result Value Ref Range   Prothrombin Time 27.1 (H) 11.4 - 15.2 seconds   INR 2.5 (H) 0.8 - 1.2    Comment: (NOTE) INR goal varies based on device and disease states. Performed at [facility], [place], [place] [address]   Troponin I (High Sensitivity)     Status: Abnormal   Collection Time: 04/08/19 11:49 AM  Result Value Ref Range   Troponin I (High Sensitivity) 86 (H) <18 ng/L    Comment: (NOTE) Elevated high sensitivity troponin I (hsTnI) values and significant  changes across serial measurements may suggest ACS but many other  chronic and acute conditions are known to elevate hsTnI results.  Refer to the "Links" section for chest pain algorithms  and additional  guidance. Performed at [facility], [address] [place], [place] [address]   Basic metabolic panel     Status: Abnormal   Collection Time: 04/08/19 12:40 PM  Result Value Ref Range   Sodium 142 135 - 145 mmol/L   Potassium 4.0 3.5 - 5.1 mmol/L   Chloride 108 98 - 111 mmol/L   CO2 25 22 - 32 mmol/L   Glucose, Bld 157 (H) 70 - 99 mg/dL    Comment: Glucose reference range applies only to samples taken after fasting for at least 8 hours.   BUN 35 (H) 8 - 23 mg/dL   Creatinine, Ser 2.18 (H) 0.44 - 1.00 mg/dL   Calcium 9.1 8.9 - 10.3 mg/dL   GFR calc non Af Amer 23 (L) >60  mL/min   GFR calc Af Amer 27 (L) >60 mL/min   Anion gap 9 5 - 15    Comment: Performed at [facility], [address] [place], [place] [address]  Magnesium     Status: None   Collection Time: 04/08/19 12:40 PM  Result Value Ref Range   Magnesium 2.3 1.7 - 2.4 mg/dL    Comment: Performed at [facility], [address] [place], [place] [address]  Phosphorus     Status: None   Collection Time: 04/08/19 12:40 PM  Result Value Ref Range   Phosphorus 3.2 2.5 - 4.6 mg/dL    Comment: Performed at [facility], [address] [place], [place] [address]    Current Facility-Administered Medications  Medication Dose Route Frequency Provider Last Rate Last Admin  . 0.9 %  sodium chloride infusion  250 mL Intravenous PRN Agbata, Tochukwu, MD      . acetaminophen (TYLENOL) tablet 650 mg  650 mg Oral Q4H PRN Agbata, Tochukwu, MD      . atorvastatin (LIPITOR) tablet 20 mg  20 mg Oral q1800 Agbata, Tochukwu, MD   20 mg at 04/08/19 1922  . carvedilol (COREG) tablet 3.125 mg  3.125 mg Oral BID WC Agbata, Tochukwu, MD   3.125 mg at 04/08/19 1922  . dicyclomine (BENTYL) capsule 10 mg  10 mg Oral TID PRN Agbata, Tochukwu, MD   10 mg at 04/08/19 1629  . docusate sodium (COLACE) capsule 100 mg  100 mg Oral Daily Agbata, Tochukwu, MD   100 mg at 04/08/19 1144  . FLUoxetine  (PROZAC) capsule 40 mg  40 mg Oral Daily Agbata, Tochukwu, MD   40 mg at 04/08/19 1144  . furosemide (LASIX) injection 40 mg  40 mg Intravenous BID Agbata, Tochukwu, MD   40 mg at 04/08/19 1923  . ipratropium-albuterol (DUONEB) 0.5-2.5 (3) MG/3ML nebulizer solution 3 mL  3 mL Nebulization Q6H PRN Agbata, Tochukwu, MD      . loratadine (CLARITIN) tablet 10 mg  10 mg Oral Daily Agbata, Tochukwu, MD   10 mg at 04/08/19 1143  . nitroGLYCERIN (NITROSTAT) SL tablet 0.4 mg  0.4 mg Sublingual Q5 min PRN Agbata, Tochukwu, MD      . ondansetron (ZOFRAN) injection 4 mg  4 mg Intravenous Q6H PRN Agbata, Tochukwu, MD   4 mg at 04/08/19 2118  . polyethylene glycol (MIRALAX / GLYCOLAX) packet 17 g  17 g Oral Daily Agbata, Tochukwu, MD   17 g at 04/08/19 2013  . rivaroxaban (XARELTO) tablet 20 mg  20 mg Oral Q supper Agbata, Tochukwu, MD      . sacubitril-valsartan (ENTRESTO) 24-26 mg per tablet  1 tablet Oral BID Agbata, Tochukwu, MD   1 tablet at 04/08/19 2118  . sodium chloride flush (NS) 0.9 % injection 3 mL  3 mL Intravenous Q12H Agbata, Tochukwu, MD   3 mL at 04/08/19 2119  . sodium chloride flush (NS) 0.9 % injection 3 mL  3 mL Intravenous PRN Agbata, Tochukwu, MD      . spironolactone (ALDACTONE) tablet 12.5 mg  12.5 mg Oral Daily Agbata, Tochukwu, MD   12.5 mg at 04/08/19 1144  . traZODone (DESYREL) tablet 150 mg  150 mg Oral QHS PRN Agbata, Tochukwu, MD        Musculoskeletal: Strength & Muscle Tone: within normal limits Gait & Station: normal Patient leans: Backward  Psychiatric Specialty Exam: Physical Exam  Nursing note and vitals reviewed. Constitutional: She is oriented to  person, place, and time. She appears well-developed.  Cardiovascular: Normal rate.  Musculoskeletal:        General: Normal range of motion.     Cervical back: Normal range of motion and neck supple.  Neurological: She is alert and oriented to person, place, and time.  Psychiatric: Thought content normal.    Review of  Systems  Psychiatric/Behavioral: Positive for agitation and sleep disturbance. The patient is nervous/anxious.   All other systems reviewed and are negative.   Blood pressure 112/90, pulse 85, temperature 98.3 F (36.8 C), temperature source Oral, resp. rate (!) 24, height 5\' 9"  (1.753 m), weight 95.3 kg, SpO2 96 %.Body mass index is 31.01 kg/m.  General Appearance: Casual  Eye Contact:  Good  Speech:  Clear and Coherent and Slow  Volume:  Decreased  Mood:  Anxious, Depressed, Hopeless and Irritable  Affect:  Congruent  Thought Process:  Coherent  Orientation:  Full (Time, Place, and Person)  Thought Content:  Logical  Suicidal Thoughts:  No  Homicidal Thoughts:  No  Memory:  Immediate;   Good Recent;   Good Remote;   Good  Judgement:  Fair  Insight:  Lacking  Psychomotor Activity:  Normal  Concentration:  Concentration: Good and Attention Span: Good  Recall:  Good  Fund of Knowledge:  Good  Language:  Good  Akathisia:  Negative  Handed:  Right  AIMS (if indicated):     Assets:  Desire for Improvement Financial Resources/Insurance Housing Physical Health Resilience Social Support  ADL's:  Intact  Cognition:  WNL  Sleep:    Insomnia     Treatment Plan Summary: Plan Patient does not meet criteria for psychiatric inpatient admission.    Disposition: No evidence of imminent risk to self or others at present.   Patient does not meet criteria for psychiatric inpatient admission. Supportive therapy provided about ongoing stressors.  Caroline Sauger, NP 04/09/2019 1:54 AM

## 2019-04-09 NOTE — Progress Notes (Signed)
[facility]  ROUNDING NOTE   Subjective:  Late entry. Patient seen and evaluated at bedside prior to discharge. We will follow patient in the office for chronic kidney disease stage IIIb with baseline creatinine of 1.6 and EGFR 39. She came in now with an episode of acute kidney injury. Creatinine currently 2.34 with EGFR of 25. She was admitted with CHF exacerbation as well as hypertensive urgency. She has severe underlying heart failure with ejection fraction less than 20%. She also has a large fixed thrombus on the lateral wall of the left ventricle.  Objective:  Vital signs in last 24 hours:  Temp:  [98.2 F (36.8 C)-98.8 F (37.1 C)] 98.3 F (36.8 C) (04/01 1136) Pulse Rate:  [73-102] 85 (04/01 1136) Resp:  [16-32] 16 (04/01 1136) BP: (112-146)/(90-106) 118/92 (04/01 1136) SpO2:  [95 %-100 %] 96 % (04/01 1136) Weight:  [95.1 kg-95.3 kg] 95.1 kg (04/01 0441)  Weight change: -0.021 kg Filed Weights   04/08/19 0639 04/08/19 2359 04/09/19 0441  Weight: 95.3 kg 95.3 kg 95.1 kg    Intake/Output: I/O last 3 completed shifts: In: -  Out: 1050 [Urine:1050]   Intake/Output this shift:  Total I/O In: 240 [P.O.:240] Out: -   Physical Exam: General: No acute distress  Head: Normocephalic, atraumatic. Moist oral mucosal membranes  Eyes: Anicteric  Neck: Supple, trachea midline  Lungs:  Minimal basilar rales  Heart: S1S2 no rubs  Abdomen:  Soft, nontender, bowel sounds present  Extremities: trace peripheral edema.  Neurologic: Awake, alert, following commands, pressured speech  Skin: No acute rash       Basic Metabolic Panel: Recent Labs  Lab 04/08/19 0737 04/08/19 1240 04/09/19 0502  NA 141 142 141  K 3.9 4.0 3.4*  CL 109 108 107  CO2 '22 25 25  '$ GLUCOSE 114* 157* 79  BUN 35* 35* 40*  CREATININE 2.09* 2.18* 2.34*  CALCIUM 9.2 9.1 8.8*  MG  --  2.3  --   PHOS  --  3.2  --     Liver Function Tests: Recent Labs  Lab 04/08/19 0737  AST 35   ALT 30  ALKPHOS 135*  BILITOT 1.0  PROT 6.7  ALBUMIN 3.3*   Recent Labs  Lab 04/08/19 0737  LIPASE 29   No results for input(s): AMMONIA in the last 168 hours.  CBC: Recent Labs  Lab 04/08/19 0737  WBC 3.7*  HGB 11.4*  HCT 38.2  MCV 80.4  PLT 353    Cardiac Enzymes: No results for input(s): CKTOTAL, CKMB, CKMBINDEX, TROPONINI in the last 168 hours.  BNP: Invalid input(s): POCBNP  CBG: No results for input(s): GLUCAP in the last 168 hours.  Microbiology: Results for orders placed or performed during the hospital encounter of 03/06/19  Respiratory Panel by RT PCR (Flu A&B, Covid) - Nasopharyngeal Swab     Status: Abnormal   Collection Time: 03/06/19 10:03 AM   Specimen: Nasopharyngeal Swab  Result Value Ref Range Status   SARS Coronavirus 2 by RT PCR POSITIVE (A) NEGATIVE Final    Comment: RESULT CALLED TO, READ BACK BY AND VERIFIED WITH: LORRIE LEMONS AT 1152 03/06/19.PMF (NOTE) SARS-CoV-2 target nucleic acids are DETECTED. SARS-CoV-2 RNA is generally detectable in upper respiratory specimens  during the acute phase of infection. Positive results are indicative of the presence of the identified virus, but do not rule out bacterial infection or co-infection with other pathogens not detected by the test. Clinical correlation with patient history and other diagnostic information  is necessary to determine patient infection status. The expected result is Negative. Fact Sheet for Patients:  PinkCheek.be Fact Sheet for Healthcare Providers: GravelBags.it This test is not yet approved or cleared by the [place] FDA and  has been authorized for detection and/or diagnosis of SARS-CoV-2 by FDA under an Emergency Use Authorization (EUA).  This EUA will remain in effect (meaning this test can be used) for  the duration of  the COVID-19 declaration under Section 564(b)(1) of the Act, 21 U.S.C. section  360bbb-3(b)(1), unless the authorization is terminated or revoked sooner.    Influenza A by PCR NEGATIVE NEGATIVE Final   Influenza B by PCR NEGATIVE NEGATIVE Final    Comment: (NOTE) The Xpert Xpress SARS-CoV-2/FLU/RSV assay is intended as an aid in  the diagnosis of influenza from Nasopharyngeal swab specimens and  should not be used as a sole basis for treatment. Nasal washings and  aspirates are unacceptable for Xpert Xpress SARS-CoV-2/FLU/RSV  testing. Fact Sheet for Patients: PinkCheek.be Fact Sheet for Healthcare Providers: GravelBags.it This test is not yet approved or cleared by the [place] FDA and  has been authorized for detection and/or diagnosis of SARS-CoV-2 by  FDA under an Emergency Use Authorization (EUA). This EUA will remain  in effect (meaning this test can be used) for the duration of the  Covid-19 declaration under Section 564(b)(1) of the Act, 21  U.S.C. section 360bbb-3(b)(1), unless the authorization is  terminated or revoked. Performed at [facility], [address] [place], [place] [address]   Culture, blood (Routine X 2) w Reflex to ID Panel     Status: None   Collection Time: 03/07/19  6:22 AM   Specimen: BLOOD  Result Value Ref Range Status   Specimen Description BLOOD RIGHT HAND  Final   Special Requests   Final    BOTTLES DRAWN AEROBIC AND ANAEROBIC Blood Culture adequate volume   Culture   Final    NO GROWTH 5 DAYS Performed at [facility], [address], [place], [place] [address]    Report Status 03/12/2019 FINAL  Final  Culture, blood (Routine X 2) w Reflex to ID Panel     Status: None   Collection Time: 03/07/19  6:22 AM   Specimen: BLOOD  Result Value Ref Range Status   Specimen Description BLOOD LEFT ANTECUBITAL  Final   Special Requests   Final    BOTTLES DRAWN AEROBIC AND ANAEROBIC Blood Culture adequate volume   Culture   Final    NO GROWTH 5  DAYS Performed at [facility], [address] [place], [place] [address]    Report Status 03/12/2019 FINAL  Final    Coagulation Studies: Recent Labs    04/08/19 0851  LABPROT 27.1*  INR 2.5*    Urinalysis: No results for input(s): COLORURINE, LABSPEC, PHURINE, GLUCOSEU, HGBUR, BILIRUBINUR, KETONESUR, PROTEINUR, UROBILINOGEN, NITRITE, LEUKOCYTESUR in the last 72 hours.  Invalid input(s): APPERANCEUR    Imaging: DG Chest 2 View  Result Date: 04/08/2019 CLINICAL DATA:  Dyspnea EXAM: CHEST - 2 VIEW COMPARISON:  03/24/2019 FINDINGS: Cardiomegaly. Vascular congestion that appears similar to prior. No visible effusion or pneumothorax. IMPRESSION: Cardiomegaly and vascular congestion. Electronically Signed   By: Monte Fantasia M.D.   On: 04/08/2019 07:40     Medications:   . sodium chloride     . atorvastatin  20 mg Oral q1800  . carvedilol  3.125 mg Oral BID WC  . docusate sodium  100 mg Oral Daily  .  FLUoxetine  40 mg Oral Daily  . furosemide  80 mg Oral Daily  . loratadine  10 mg Oral Daily  . polyethylene glycol  17 g Oral Daily  . rivaroxaban  20 mg Oral Q supper  . sacubitril-valsartan  1 tablet Oral BID  . sodium chloride flush  3 mL Intravenous Q12H  . spironolactone  12.5 mg Oral Daily   sodium chloride, acetaminophen, dicyclomine, ipratropium-albuterol, nitroGLYCERIN, ondansetron (ZOFRAN) IV, sodium chloride flush, traZODone  Assessment/ Plan:  64 y.o. 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  1.  Acute kidney injury/chronic kidney disease stage IIIb baseline creatinine 1.6.  Acute kidney injury likely related to cardiorenal syndrome.  She remains diuretic dependent.   She will need close outpatient monitoring of her acute kidney injury and chronic kidney disease.  She is followed by my partner Dr. [name] in the office.  We will set up follow up visit and labs.   2.  Acute on chronic systolic heart failure.  Appreciate cardiology input.  Continue diuretics as well as Entresto at this time.  3.  Anemia of chronic kidney disease.  Hemoglobin 11.4 last check.  No indication for Procrit.  Continue to monitor CBC.   LOS: 1 Urania Pearlman 4/1/20212:12 PM

## 2019-04-09 NOTE — Plan of Care (Signed)
Heart Failure  SPECIAL INSTRUCTIONS AVOID STRAINING STOP ANY ACTIVITY THAT CAUSES CHEST PAIN, SHORTNESS OF BREATH, DIZZINESS, SWEATING, OR EXCESSIVE WEAKNESS.  SPECIAL INSTRUCTIONS FOR PATIENTS WITH HEART FAILURE: Continue to follow the instructions in your Heart Failure Patient Education information that you received during your hospital stay. Record daily weight on same scale at same time of day. If your doctor did not discuss your diet or activity in the information above, please follow a Heart Healthy Low Sodium diet and increase your activity as you feel able. Call your doctor: (Anytime you feel any of the following symptoms) 3-4 pound weight gain in 1-2 days or 2 pounds overnight Shortness of breath, with or without a dry hacking cough Swelling in the hands, feet or stomach If you have to sleep on extra pillows at night in order to breathe   

## 2019-04-09 NOTE — Progress Notes (Signed)
[facility]  Patient Description: Ms. 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 High sensitivity troponin was 100>>86 and BNP was >4,500. Thus patient was admitted to the telemetry unit for CHF exacerbation with hypertensive urgency and cardiology was consulted.  SUBJECTIVE: The patient reports to be doing fairly well, but she continues to c/o abdominal pain. The patient is unsure of the onset of symptoms or possible aggravating factors. She reports that the dyspnea has significantly improved since yesterday and she continues to deny any chest pain, palpitations, dizziness or syncope.   OBJECTIVE: The patient appears fairly well on today and is resting comfortably in bed. The patient is AOx3 but continues to have episodes of confusion, a poor historian of health and rambling of speech. The patient was seen by Psychiatry on yesterday and it was concluded that the patient did NOT need inpatient psychiatric care. The patient's lung are clear upon auscultation and she does not have any signs of ascites or BLE edema.   Vitals:   04/08/19 2303 04/08/19 2359 04/09/19 0441 04/09/19 0736  BP:  112/90 (!) 112/91 (!) 115/92  Pulse: 75 85 76 75  Resp: (!) 24   18  Temp:  98.3 F (36.8 C) 98.2 F (36.8 C) 98.8 F (37.1 C)  TempSrc:  Oral Oral   SpO2: 100% 96% 99% 98%  Weight:  95.3 kg 95.1 kg   Height:  5\' 9"  (1.753 m)       Intake/Output Summary (Last 24 hours) at 04/09/2019 1012 Last data filed at 04/09/2019 0300 Gross per 24 hour  Intake --  Output 1050 ml  Net -1050 ml      PHYSICAL EXAM  General: well nourished, in no acute distress HEENT:  Normocephalic and atramatic  Neck:  No JVD.  Lungs: Clear bilaterally to  auscultation Heart: HRRR . Normal S1 and S2 without gallops or murmurs.  Abdomen: Bowel sounds are positive, abdomen soft and non-tender  Msk:  Normal strength and tone for age. Extremities: No clubbing, cyanosis or edema.   Neuro: Alert and oriented X 3. Psych:  Good affect, responds appropriately, intermittent rambling of speech    LABS: Basic Metabolic Panel: Recent Labs    04/08/19 1240 04/09/19 0502  NA 142 141  K 4.0 3.4*  CL 108 107  CO2 25 25  GLUCOSE 157* 79  BUN 35* 40*  CREATININE 2.18* 2.34*  CALCIUM 9.1 8.8*  MG 2.3  --   PHOS 3.2  --    Liver Function Tests: Recent Labs    04/08/19 0737  AST 35  ALT 30  ALKPHOS 135*  BILITOT 1.0  PROT 6.7  ALBUMIN 3.3*   Recent Labs    04/08/19 0737  LIPASE 29   CBC: Recent Labs    04/08/19 0737  WBC 3.7*  HGB 11.4*  HCT 38.2  MCV 80.4  PLT 353   Cardiac Enzymes: No results for input(s): CKTOTAL, CKMB, CKMBINDEX, TROPONINI in the last 72 hours. BNP: Invalid input(s): POCBNP D-Dimer: No results for input(s): DDIMER in the last 72 hours. Hemoglobin A1C: No results for input(s): HGBA1C in the last 72 hours. Fasting Lipid Panel: No results for input(s): CHOL, HDL, LDLCALC, TRIG, CHOLHDL, LDLDIRECT in the last 72 hours. Thyroid Function Tests: No results for  input(s): TSH, T4TOTAL, T3FREE, THYROIDAB in the last 72 hours.  Invalid input(s): FREET3 Anemia Panel: No results for input(s): VITAMINB12, FOLATE, FERRITIN, TIBC, IRON, RETICCTPCT in the last 72 hours.  DG Chest 2 View  Result Date: 04/08/2019 CLINICAL DATA:  Dyspnea EXAM: CHEST - 2 VIEW COMPARISON:  03/24/2019 FINDINGS: Cardiomegaly. Vascular congestion that appears similar to prior. No visible effusion or pneumothorax. IMPRESSION: Cardiomegaly and vascular congestion. Electronically Signed   By: Monte Fantasia M.D.   On: 04/08/2019 07:40     Echo: (01/2019) IMPRESSIONS  1. Left ventricular ejection fraction, by visual estimation, is <20%.  The  left ventricle has severely decreased function. There is no left  ventricular hypertrophy.LVEF 6 PERCENT  2. Definity contrast agent was given IV to delineate the left ventricular  endocardial borders.  3. Large, fixed thrombus on the lateral wall of the left ventricle.  4. Left ventricular diastolic parameters are consistent with Grade III  diastolic dysfunction (restrictive).  5. Severely dilated left ventricular internal cavity size.  6. The left ventricle demonstrates global hypokinesis.  7. Global right ventricle has normal systolic function.The right  ventricular size is severely enlarged. No increase in right ventricular  wall thickness.  8. Left atrial size was severely dilated.  9. Right atrial size was severely dilated.  10. The mitral valve is normal in structure. Trivial mitral valve  regurgitation. No evidence of mitral stenosis.  11. The tricuspid valve is normal in structure.  12. The aortic valve is normal in structure. Aortic valve regurgitation is  not visualized. No evidence of aortic valve sclerosis or stenosis.  13. The pulmonic valve was normal in structure. Pulmonic valve  regurgitation is not visualized.  14. Moderately elevated pulmonary artery systolic pressure.  15. The inferior vena cava is normal in size with greater than 50%  respiratory variability, suggesting right atrial pressure of 3 mmHg.   TELEMETRY: SR   ASSESSMENT AND PLAN:  Principal Problem:   Acute on chronic combined systolic and diastolic CHF (congestive heart failure) (HCC) Active Problems:   Dilated cardiomyopathy (HCC)   Depression   Cocaine use   Tobacco abuse   LV (left ventricular) mural thrombus   CKD (chronic kidney disease), stage IIIa   Acute combined systolic and diastolic CHF, NYHA class 1 ([place])   1. Acute on chronic combine systolic and diastolic HF, recent Echo from 01/2019 reveals severely depressed LV function with an EF estimated at <20%.              -Agree with diuresis, continuous cardiac monitoring, 1200cc fluid restriction and salt restriction  -Recommend transitioning to oral Lasix 40mg  twice daily              -Continue at home dosages of Entresto 24-26mg  twice daily, spironolactone 12.5mg  daily and coreg 3.125mg  twice daily              -Strict I&O's             -Agree with admission to telemetry             -Consider ICD as outpatient   -Agree with Heart Failure team consult   2. Hypertensive urgency, likely due to demand ischemia and coicaine use, currently resolved, patient is now normotensive at 129/89             - Continue management with coreg and entresto   3. Elevated troponin likely due to demand ischemia, recent Cardiac Cath in 12/2018 revealed mild disease in RCA and  Cx             - Serial high sensitivity troponins are 100>>86  -Cardiac Cath not indicated at this time   4. H/o Cardiac thrombus             - Continue Xarelto   5. CKD with BUN=40 and crea 2.34, worsening kidney function             -Nephrology input appreciated   The patient's history, physical exam findings and plan of care were all discussed with Dr. Karma Greaser D. [place], whom also evaluated the patient, and all decision making was made in collaboration with him.    Prudencio Velazco, ACNPC-AG  04/09/2019 10:12 AM

## 2019-04-09 NOTE — Discharge Summary (Addendum)
Physician Discharge Summary  Katelyn Lane UXN:[id number] DOB: 02/13/1955 DOA: 04/08/2019  PCP: [name], MD  Admit date: 04/08/2019 Discharge date: 04/09/2019  Discharge disposition: Home   Recommendations for Outpatient Follow-Up:   Follow-up in the heart failure clinic on 04/21/2019 Follow-up with nephrologist in 1 week  Discharge Diagnosis:   Principal Problem:   Acute on chronic combined systolic and diastolic CHF (congestive heart failure) (HCC) Active Problems:   Dilated cardiomyopathy (HCC)   Depression   Cocaine use   Tobacco abuse   LV (left ventricular) mural thrombus   CKD (chronic kidney disease), stage IIIa   Acute combined systolic and diastolic CHF, NYHA class 1 ([place])    Discharge Condition: Stable.  Diet recommendation: Low-salt diet  Code status: Full code.    Hospital Course:   Katelyn Lane is a 64 y.o. female with medical history significant for chronic combined systolic and diastolic CHF, cocaine abuse, CKD stage IIIb/IV, recent COVID-19 infection. Recently  admitted at [facility] for acute respiratory failure secondary to both COVID-19 pneumonia and acute on chronic CHF followed by 2 other hospitalizations here for the same from 2/26-3/2 and 03/16 to 03/ 20. She presented to the emergency room with acute onset of worsening redness of breath and orthopnea with paroxysmal nocturnal dyspnea without worsening lower extremity edema. She admitted to wheezing and excessive cough with epigastric and periumbilical abdominal pain.  She was admitted to the hospital for acute exacerbation of chronic systolic and diastolic CHF.  She was treated with IV Lasix with rapid and significant improvement in her symptoms.  She complained of feeling depressed and she was seen by the psychiatrist for this.  Her depression was thought to be situational since she said she was going through a lot of issues.  Patient said she was not suicidal or homicidal.  Her condition  has improved and she is deemed stable for discharge to home today.  She has she has been advised to avoid cocaine      Discharge Exam:   Vitals:   04/09/19 0736 04/09/19 1136  BP: (!) 115/92 (!) 118/92  Pulse: 75 85  Resp: 18 16  Temp: 98.8 F (37.1 C) 98.3 F (36.8 C)  SpO2: 98% 96%   Vitals:   04/08/19 2359 04/09/19 0441 04/09/19 0736 04/09/19 1136  BP: 112/90 (!) 112/91 (!) 115/92 (!) 118/92  Pulse: 85 76 75 85  Resp:   18 16  Temp: 98.3 F (36.8 C) 98.2 F (36.8 C) 98.8 F (37.1 C) 98.3 F (36.8 C)  TempSrc: Oral Oral    SpO2: 96% 99% 98% 96%  Weight: 95.3 kg 95.1 kg    Height: 5\' 9"  (1.753 m)        GEN: NAD SKIN: No rash EYES: red eye (from allergy) ENT: MMM CV: RRR PULM: CTA B ABD: soft, obese, NT, +BS CNS: AAO x 3, non focal EXT: No edema or tenderness   The results of significant diagnostics from this hospitalization (including imaging, microbiology, ancillary and laboratory) are listed below for reference.     Procedures and Diagnostic Studies:   DG Chest 2 View  Result Date: 04/08/2019 CLINICAL DATA:  Dyspnea EXAM: CHEST - 2 VIEW COMPARISON:  03/24/2019 FINDINGS: Cardiomegaly. Vascular congestion that appears similar to prior. No visible effusion or pneumothorax. IMPRESSION: Cardiomegaly and vascular congestion. Electronically Signed   By: Monte Fantasia M.D.   On: 04/08/2019 07:40     Labs:   Basic Metabolic Panel: Recent Labs  Lab 04/08/19  6440 04/08/19 0737 04/08/19 1240 04/09/19 0502  NA 141  --  142 141  K 3.9   < > 4.0 3.4*  CL 109  --  108 107  CO2 22  --  25 25  GLUCOSE 114*  --  157* 79  BUN 35*  --  35* 40*  CREATININE 2.09*  --  2.18* 2.34*  CALCIUM 9.2  --  9.1 8.8*  MG  --   --  2.3  --   PHOS  --   --  3.2  --    < > = values in this interval not displayed.   GFR Estimated Creatinine Clearance: 30.2 mL/min (A) (by C-G formula based on SCr of 2.34 mg/dL (H)). Liver Function Tests: Recent Labs  Lab  04/08/19 0737  AST 35  ALT 30  ALKPHOS 135*  BILITOT 1.0  PROT 6.7  ALBUMIN 3.3*   Recent Labs  Lab 04/08/19 0737  LIPASE 29   No results for input(s): AMMONIA in the last 168 hours. Coagulation profile Recent Labs  Lab 04/08/19 0851  INR 2.5*    CBC: Recent Labs  Lab 04/08/19 0737  WBC 3.7*  HGB 11.4*  HCT 38.2  MCV 80.4  PLT 353   Cardiac Enzymes: No results for input(s): CKTOTAL, CKMB, CKMBINDEX, TROPONINI in the last 168 hours. BNP: Invalid input(s): POCBNP CBG: No results for input(s): GLUCAP in the last 168 hours. D-Dimer No results for input(s): DDIMER in the last 72 hours. Hgb A1c No results for input(s): HGBA1C in the last 72 hours. Lipid Profile No results for input(s): CHOL, HDL, LDLCALC, TRIG, CHOLHDL, LDLDIRECT in the last 72 hours. Thyroid function studies No results for input(s): TSH, T4TOTAL, T3FREE, THYROIDAB in the last 72 hours.  Invalid input(s): FREET3 Anemia work up No results for input(s): VITAMINB12, FOLATE, FERRITIN, TIBC, IRON, RETICCTPCT in the last 72 hours. Microbiology No results found for this or any previous visit (from the past 240 hour(s)).   Discharge Instructions:   Discharge Instructions    AMB referral to CHF clinic   Complete by: As directed    Diet - low sodium heart healthy   Complete by: As directed    Increase activity slowly   Complete by: As directed      Allergies as of 04/09/2019      Reactions   Penicillins Hives, Rash, Other (See Comments)   Did it involve swelling of the face/tongue/throat, SOB, or low BP? No Did it involve sudden or severe rash/hives, skin peeling, or any reaction on the inside of your mouth or nose? Yes Did you need to seek medical attention at a hospital or doctor's office? Yes When did it last happen? >25 years If all above answers are "NO", may proceed with cephalosporin use.   Ace Inhibitors Nausea And Vomiting      Medication List    TAKE these medications    albuterol 108 (90 Base) MCG/ACT inhaler Commonly known as: VENTOLIN HFA Inhale 2 puffs into the lungs every 6 (six) hours as needed for wheezing or shortness of breath.   alum & mag hydroxide-simeth 200-200-20 MG/5ML suspension Commonly known as: MAALOX/MYLANTA Take 30 mLs by mouth every 4 (four) hours as needed for indigestion or heartburn.   aspirin 81 MG chewable tablet Chew 81 mg by mouth daily.   atorvastatin 20 MG tablet Commonly known as: LIPITOR Take 1 tablet (20 mg total) by mouth daily at 6 PM.   carvedilol 3.125 MG tablet Commonly known  as: COREG Take 1 tablet (3.125 mg total) by mouth 2 (two) times daily with a meal.   dicyclomine 10 MG capsule Commonly known as: BENTYL Take 10 mg by mouth 3 (three) times daily as needed.   docusate calcium 240 MG capsule Commonly known as: SURFAK Take 240 mg by mouth as needed.   Entresto 24-26 MG Generic drug: sacubitril-valsartan Take 1 tablet by mouth 2 (two) times daily.   FLUoxetine 40 MG capsule Commonly known as: PROZAC Take 1 capsule (40 mg total) by mouth daily.   furosemide 40 MG tablet Commonly known as: Lasix Take 2 tablets (80 mg total) by mouth daily.   loratadine 10 MG tablet Commonly known as: CLARITIN Take 10 mg by mouth daily.   nitroGLYCERIN 0.4 MG SL tablet Commonly known as: NITROSTAT Place 1 tablet (0.4 mg total) under the tongue every 5 (five) minutes as needed for chest pain.   polyethylene glycol 17 g packet Commonly known as: MiraLax Take 17 g by mouth daily.   rivaroxaban 20 MG Tabs tablet Commonly known as: XARELTO Take 1 tablet (20 mg total) by mouth daily with supper. For the blood clot in your heart.   spironolactone 25 MG tablet Commonly known as: ALDACTONE Take 0.5 tablets (12.5 mg total) by mouth daily.   traZODone 100 MG tablet Commonly known as: DESYREL Take 150 mg by mouth at bedtime as needed for sleep.   zinc sulfate 220 (50 Zn) MG capsule Take 1 capsule (220 mg  total) by mouth daily.      Follow-up Information    [place] Follow up on 04/21/2019.   Specialty: Cardiology Why: at 9:00am. Enter through the [place] entrance Contact information: [place] [phone]       Lavonia Dana, MD. Schedule an appointment as soon as possible for a visit in 1 week(s).   Specialty: Nephrology Contact information: [address] [place] [address] [phone]            Time coordinating discharge: 32 minutes  Signed:  [facility] 04/09/2019, 11:56 AM

## 2019-04-13 ENCOUNTER — Telehealth: Payer: Self-pay | Admitting: Family

## 2019-04-13 ENCOUNTER — Telehealth (HOSPITAL_COMMUNITY): Payer: Self-pay

## 2019-04-13 NOTE — Telephone Encounter (Signed)
Unable to reach patient regarding a new patient appointment we have made for the 3rd time after she continues to cancel. She was recently back in hospital and discharged so in attempt to see her in personat the CHF and follow up we have called several times with no way to get in contact.     Alyse Low, [place]

## 2019-04-13 NOTE — Telephone Encounter (Signed)
Attempted several times to contact, goes to recording voice mail not set up.  Attempted brother also with no answer.    [place] [phone]

## 2019-04-21 ENCOUNTER — Ambulatory Visit: Payer: Medicaid Other | Admitting: Family

## 2019-04-21 ENCOUNTER — Telehealth: Payer: Self-pay | Admitting: Family

## 2019-04-21 NOTE — Telephone Encounter (Signed)
Patient did not show for her Heart Failure Clinic appointment on 04/21/19. Will attempt to reschedule.

## 2019-04-28 ENCOUNTER — Emergency Department: Payer: Medicaid Other

## 2019-04-28 ENCOUNTER — Encounter: Payer: Self-pay | Admitting: Emergency Medicine

## 2019-04-28 ENCOUNTER — Emergency Department
Admission: EM | Admit: 2019-04-28 | Discharge: 2019-04-28 | Disposition: A | Discharge status: Home / Self Care | Payer: Medicaid Other | Source: Home / Self Care

## 2019-04-28 ENCOUNTER — Other Ambulatory Visit: Payer: Self-pay

## 2019-04-28 DIAGNOSIS — R609 Edema, unspecified: Secondary | ICD-10-CM | POA: Insufficient documentation

## 2019-04-28 DIAGNOSIS — Z5321 Procedure and treatment not carried out due to patient leaving prior to being seen by health care provider: Secondary | ICD-10-CM | POA: Insufficient documentation

## 2019-04-28 NOTE — ED Triage Notes (Signed)
Patient states she has been having increased fluid retention for the last 3 days. Reports swelling in legs and feet. Reports she has been taking all of her medication except xarelto because she is unsure where that medication is. Also reports some shortness of breath

## 2019-04-28 NOTE — ED Notes (Signed)
Patient not cooperative with blood draw at this time.

## 2019-04-29 ENCOUNTER — Other Ambulatory Visit: Payer: Self-pay

## 2019-04-29 ENCOUNTER — Emergency Department: Payer: Medicaid Other

## 2019-04-29 ENCOUNTER — Encounter: Payer: Self-pay | Admitting: Emergency Medicine

## 2019-04-29 ENCOUNTER — Telehealth (HOSPITAL_COMMUNITY): Payer: Self-pay

## 2019-04-29 ENCOUNTER — Inpatient Hospital Stay
Admission: EM | Admit: 2019-04-29 | Discharge: 2019-05-03 | DRG: 291 | Disposition: A | Discharge status: Home / Self Care | Payer: Medicaid Other | Attending: Internal Medicine | Admitting: Internal Medicine

## 2019-04-29 ENCOUNTER — Inpatient Hospital Stay
Admit: 2019-04-29 | Discharge: 2019-04-29 | Disposition: A | Discharge status: Home / Self Care | Payer: Medicaid Other | Attending: Internal Medicine | Admitting: Internal Medicine

## 2019-04-29 DIAGNOSIS — Z8673 Personal history of transient ischemic attack (TIA), and cerebral infarction without residual deficits: Secondary | ICD-10-CM | POA: Diagnosis present

## 2019-04-29 DIAGNOSIS — F1490 Cocaine use, unspecified, uncomplicated: Secondary | ICD-10-CM | POA: Diagnosis not present

## 2019-04-29 DIAGNOSIS — Z720 Tobacco use: Secondary | ICD-10-CM | POA: Diagnosis present

## 2019-04-29 DIAGNOSIS — I420 Dilated cardiomyopathy: Secondary | ICD-10-CM | POA: Diagnosis present

## 2019-04-29 DIAGNOSIS — I5043 Acute on chronic combined systolic (congestive) and diastolic (congestive) heart failure: Secondary | ICD-10-CM | POA: Diagnosis present

## 2019-04-29 DIAGNOSIS — F329 Major depressive disorder, single episode, unspecified: Secondary | ICD-10-CM | POA: Diagnosis not present

## 2019-04-29 DIAGNOSIS — I5023 Acute on chronic systolic (congestive) heart failure: Secondary | ICD-10-CM

## 2019-04-29 DIAGNOSIS — Z9114 Patient's other noncompliance with medication regimen: Secondary | ICD-10-CM | POA: Diagnosis not present

## 2019-04-29 DIAGNOSIS — Z8616 Personal history of COVID-19: Secondary | ICD-10-CM

## 2019-04-29 DIAGNOSIS — N1832 Chronic kidney disease, stage 3b: Secondary | ICD-10-CM | POA: Diagnosis present

## 2019-04-29 DIAGNOSIS — Z888 Allergy status to other drugs, medicaments and biological substances status: Secondary | ICD-10-CM | POA: Diagnosis not present

## 2019-04-29 DIAGNOSIS — I639 Cerebral infarction, unspecified: Secondary | ICD-10-CM | POA: Diagnosis present

## 2019-04-29 DIAGNOSIS — F1410 Cocaine abuse, uncomplicated: Secondary | ICD-10-CM | POA: Diagnosis present

## 2019-04-29 DIAGNOSIS — I248 Other forms of acute ischemic heart disease: Secondary | ICD-10-CM | POA: Diagnosis present

## 2019-04-29 DIAGNOSIS — I130 Hypertensive heart and chronic kidney disease with heart failure and stage 1 through stage 4 chronic kidney disease, or unspecified chronic kidney disease: Secondary | ICD-10-CM | POA: Diagnosis not present

## 2019-04-29 DIAGNOSIS — R0602 Shortness of breath: Principal | ICD-10-CM

## 2019-04-29 DIAGNOSIS — Z7982 Long term (current) use of aspirin: Secondary | ICD-10-CM | POA: Diagnosis not present

## 2019-04-29 DIAGNOSIS — E785 Hyperlipidemia, unspecified: Secondary | ICD-10-CM | POA: Diagnosis present

## 2019-04-29 DIAGNOSIS — I513 Intracardiac thrombosis, not elsewhere classified: Secondary | ICD-10-CM | POA: Diagnosis present

## 2019-04-29 DIAGNOSIS — M7989 Other specified soft tissue disorders: Secondary | ICD-10-CM

## 2019-04-29 DIAGNOSIS — I5041 Acute combined systolic (congestive) and diastolic (congestive) heart failure: Secondary | ICD-10-CM | POA: Diagnosis not present

## 2019-04-29 DIAGNOSIS — R778 Other specified abnormalities of plasma proteins: Secondary | ICD-10-CM | POA: Diagnosis not present

## 2019-04-29 DIAGNOSIS — N179 Acute kidney failure, unspecified: Secondary | ICD-10-CM | POA: Diagnosis present

## 2019-04-29 DIAGNOSIS — Z880 Allergy status to penicillin: Secondary | ICD-10-CM | POA: Diagnosis not present

## 2019-04-29 DIAGNOSIS — Z7901 Long term (current) use of anticoagulants: Secondary | ICD-10-CM

## 2019-04-29 DIAGNOSIS — Z79899 Other long term (current) drug therapy: Secondary | ICD-10-CM | POA: Diagnosis not present

## 2019-04-29 DIAGNOSIS — I5020 Unspecified systolic (congestive) heart failure: Secondary | ICD-10-CM

## 2019-04-29 DIAGNOSIS — N1831 Chronic kidney disease, stage 3a: Secondary | ICD-10-CM | POA: Diagnosis present

## 2019-04-29 DIAGNOSIS — F17200 Nicotine dependence, unspecified, uncomplicated: Secondary | ICD-10-CM | POA: Diagnosis present

## 2019-04-29 DIAGNOSIS — F32A Depression, unspecified: Secondary | ICD-10-CM | POA: Diagnosis present

## 2019-04-29 LAB — URINE DRUG SCREEN, QUALITATIVE (ARMC ONLY)
Amphetamines, Ur Screen: NOT DETECTED
Barbiturates, Ur Screen: NOT DETECTED
Benzodiazepine, Ur Scrn: NOT DETECTED
Cannabinoid 50 Ng, Ur ~~LOC~~: NOT DETECTED
Cocaine Metabolite,Ur ~~LOC~~: POSITIVE — AB
MDMA (Ecstasy)Ur Screen: NOT DETECTED
Methadone Scn, Ur: NOT DETECTED
Opiate, Ur Screen: NOT DETECTED
Phencyclidine (PCP) Ur S: NOT DETECTED
Tricyclic, Ur Screen: NOT DETECTED

## 2019-04-29 LAB — COMPREHENSIVE METABOLIC PANEL
ALT: 55 U/L — ABNORMAL HIGH (ref 0–44)
AST: 43 U/L — ABNORMAL HIGH (ref 15–41)
Albumin: 3.2 g/dL — ABNORMAL LOW (ref 3.5–5.0)
Alkaline Phosphatase: 238 U/L — ABNORMAL HIGH (ref 38–126)
Anion gap: 8 (ref 5–15)
BUN: 38 mg/dL — ABNORMAL HIGH (ref 8–23)
CO2: 23 mmol/L (ref 22–32)
Calcium: 9 mg/dL (ref 8.9–10.3)
Chloride: 112 mmol/L — ABNORMAL HIGH (ref 98–111)
Creatinine, Ser: 2.16 mg/dL — ABNORMAL HIGH (ref 0.44–1.00)
GFR calc Af Amer: 27 mL/min — ABNORMAL LOW (ref >60)
GFR calc non Af Amer: 24 mL/min — ABNORMAL LOW (ref >60)
Glucose, Bld: 118 mg/dL — ABNORMAL HIGH (ref 70–99)
Potassium: 3.6 mmol/L (ref 3.5–5.1)
Sodium: 143 mmol/L (ref 135–145)
Total Bilirubin: 0.9 mg/dL (ref 0.3–1.2)
Total Protein: 6.6 g/dL (ref 6.5–8.1)

## 2019-04-29 LAB — CBC WITH DIFFERENTIAL/PLATELET
Abs Immature Granulocytes: 0.02 10*3/uL (ref 0.00–0.07)
Basophils Absolute: 0 10*3/uL (ref 0.0–0.1)
Basophils Relative: 0 %
Eosinophils Absolute: 0.1 10*3/uL (ref 0.0–0.5)
Eosinophils Relative: 3 %
HCT: 38 % (ref 36.0–46.0)
Hemoglobin: 11.2 g/dL — ABNORMAL LOW (ref 12.0–15.0)
Immature Granulocytes: 0 %
Lymphocytes Relative: 23 %
Lymphs Abs: 1 10*3/uL (ref 0.7–4.0)
MCH: 23.1 pg — ABNORMAL LOW (ref 26.0–34.0)
MCHC: 29.5 g/dL — ABNORMAL LOW (ref 30.0–36.0)
MCV: 78.4 fL — ABNORMAL LOW (ref 80.0–100.0)
Monocytes Absolute: 0.4 10*3/uL (ref 0.1–1.0)
Monocytes Relative: 9 %
Neutro Abs: 3 10*3/uL (ref 1.7–7.7)
Neutrophils Relative %: 65 %
Platelets: 248 10*3/uL (ref 150–400)
RBC: 4.85 MIL/uL (ref 3.87–5.11)
RDW: 20.7 % — ABNORMAL HIGH (ref 11.5–15.5)
WBC: 4.6 10*3/uL (ref 4.0–10.5)
nRBC: 0 % (ref 0.0–0.2)

## 2019-04-29 LAB — TROPONIN I (HIGH SENSITIVITY)
Troponin I (High Sensitivity): 108 ng/L (ref <18)
Troponin I (High Sensitivity): 123 ng/L (ref <18)

## 2019-04-29 LAB — BRAIN NATRIURETIC PEPTIDE: B Natriuretic Peptide: >4500 pg/mL — ABNORMAL HIGH (ref 0.0–100.0)

## 2019-04-29 LAB — PROTIME-INR
INR: 1.1 (ref 0.8–1.2)
Prothrombin Time: 14.3 seconds (ref 11.4–15.2)

## 2019-04-29 LAB — APTT: aPTT: 28 seconds (ref 24–36)

## 2019-04-29 MED ORDER — ASPIRIN EC 81 MG PO TBEC
81.0000 mg | DELAYED_RELEASE_TABLET | Freq: Every day | ORAL | Status: DC
  Administered 2019-04-30 – 2019-05-03 (×4): 81 mg via ORAL
  Filled 2019-04-29 (×4): qty 1

## 2019-04-29 MED ORDER — NITROGLYCERIN 0.4 MG SL SUBL
0.4000 mg | SUBLINGUAL_TABLET | SUBLINGUAL | Status: DC | PRN
  Administered 2019-05-02: 0.4 mg via SUBLINGUAL
  Filled 2019-04-29: qty 1

## 2019-04-29 MED ORDER — FUROSEMIDE 10 MG/ML IJ SOLN
40.0000 mg | Freq: Two times a day (BID) | INTRAMUSCULAR | Status: DC
  Administered 2019-04-29: 40 mg via INTRAVENOUS
  Filled 2019-04-29: qty 4

## 2019-04-29 MED ORDER — ONDANSETRON HCL 4 MG/2ML IJ SOLN
4.0000 mg | Freq: Three times a day (TID) | INTRAMUSCULAR | Status: DC | PRN
  Administered 2019-05-01: 08:00:00 4 mg via INTRAVENOUS
  Filled 2019-04-29: qty 2

## 2019-04-29 MED ORDER — DOCUSATE SODIUM 100 MG PO CAPS
100.0000 mg | ORAL_CAPSULE | Freq: Every day | ORAL | Status: DC | PRN
  Administered 2019-04-30: 100 mg via ORAL
  Filled 2019-04-29: qty 1

## 2019-04-29 MED ORDER — ALUM & MAG HYDROXIDE-SIMETH 200-200-20 MG/5ML PO SUSP
30.0000 mL | ORAL | Status: DC | PRN
  Administered 2019-04-30 – 2019-05-03 (×4): 30 mL via ORAL
  Filled 2019-04-29 (×5): qty 30

## 2019-04-29 MED ORDER — FUROSEMIDE 10 MG/ML IJ SOLN
60.0000 mg | Freq: Once | INTRAMUSCULAR | Status: AC
  Administered 2019-04-29: 60 mg via INTRAVENOUS
  Filled 2019-04-29: qty 8

## 2019-04-29 MED ORDER — ATORVASTATIN CALCIUM 20 MG PO TABS
20.0000 mg | ORAL_TABLET | Freq: Every day | ORAL | Status: DC
  Administered 2019-04-29 – 2019-05-02 (×4): 20 mg via ORAL
  Filled 2019-04-29 (×4): qty 1

## 2019-04-29 MED ORDER — SODIUM CHLORIDE 0.9% FLUSH
3.0000 mL | Freq: Two times a day (BID) | INTRAVENOUS | Status: DC
  Administered 2019-04-29 – 2019-05-03 (×7): 3 mL via INTRAVENOUS

## 2019-04-29 MED ORDER — PERFLUTREN LIPID MICROSPHERE
1.0000 mL | INTRAVENOUS | Status: AC | PRN
  Administered 2019-04-29: 2 mL via INTRAVENOUS
  Filled 2019-04-29: qty 10

## 2019-04-29 MED ORDER — RIVAROXABAN 20 MG PO TABS
20.0000 mg | ORAL_TABLET | Freq: Every day | ORAL | Status: DC
  Administered 2019-04-30 – 2019-05-02 (×3): 20 mg via ORAL
  Filled 2019-04-29 (×3): qty 1

## 2019-04-29 MED ORDER — SODIUM CHLORIDE 0.9 % IV SOLN: 250.0000 mL | INTRAVENOUS | Status: DC | PRN

## 2019-04-29 MED ORDER — LORATADINE 10 MG PO TABS
10.0000 mg | ORAL_TABLET | Freq: Every day | ORAL | Status: DC
  Administered 2019-04-30 – 2019-05-03 (×4): 10 mg via ORAL
  Filled 2019-04-29 (×4): qty 1

## 2019-04-29 MED ORDER — DICYCLOMINE HCL 20 MG PO TABS
20.0000 mg | ORAL_TABLET | Freq: Three times a day (TID) | ORAL | Status: DC | PRN
  Administered 2019-04-30 – 2019-05-03 (×2): 20 mg via ORAL
  Filled 2019-04-29 (×5): qty 1

## 2019-04-29 MED ORDER — ALBUTEROL SULFATE (2.5 MG/3ML) 0.083% IN NEBU
3.0000 mL | INHALATION_SOLUTION | RESPIRATORY_TRACT | Status: DC | PRN
  Administered 2019-04-29: 3 mL via RESPIRATORY_TRACT
  Filled 2019-04-29: qty 3

## 2019-04-29 MED ORDER — MELATONIN 5 MG PO TABS
5.0000 mg | ORAL_TABLET | Freq: Every day | ORAL | Status: DC
  Administered 2019-04-29 – 2019-05-02 (×4): 5 mg via ORAL
  Filled 2019-04-29 (×4): qty 1

## 2019-04-29 MED ORDER — ASCORBIC ACID 500 MG PO TABS
500.0000 mg | ORAL_TABLET | Freq: Every day | ORAL | Status: DC
  Administered 2019-04-30 – 2019-05-03 (×4): 500 mg via ORAL
  Filled 2019-04-29 (×4): qty 1

## 2019-04-29 MED ORDER — SODIUM CHLORIDE 0.9% FLUSH: 3.0000 mL | INTRAVENOUS | Status: DC | PRN

## 2019-04-29 MED ORDER — CARVEDILOL 3.125 MG PO TABS
3.1250 mg | ORAL_TABLET | Freq: Two times a day (BID) | ORAL | Status: DC
  Administered 2019-04-30 – 2019-05-03 (×6): 3.125 mg via ORAL
  Filled 2019-04-29 (×7): qty 1

## 2019-04-29 MED ORDER — HYDRALAZINE HCL 20 MG/ML IJ SOLN: 5.0000 mg | INTRAMUSCULAR | Status: DC | PRN

## 2019-04-29 MED ORDER — FLUOXETINE HCL 20 MG PO CAPS
20.0000 mg | ORAL_CAPSULE | Freq: Every day | ORAL | Status: DC
  Administered 2019-04-30 – 2019-05-03 (×4): 20 mg via ORAL
  Filled 2019-04-29 (×4): qty 1

## 2019-04-29 MED ORDER — SPIRONOLACTONE 25 MG PO TABS
12.5000 mg | ORAL_TABLET | Freq: Every day | ORAL | Status: DC
  Administered 2019-04-30: 12.5 mg via ORAL
  Filled 2019-04-29 (×2): qty 0.5
  Filled 2019-04-29: qty 1

## 2019-04-29 MED ORDER — DM-GUAIFENESIN ER 30-600 MG PO TB12
1.0000 | ORAL_TABLET | Freq: Two times a day (BID) | ORAL | Status: DC | PRN
  Administered 2019-04-30: 1 via ORAL
  Filled 2019-04-29 (×2): qty 1

## 2019-04-29 MED ORDER — SACUBITRIL-VALSARTAN 24-26 MG PO TABS
1.0000 | ORAL_TABLET | Freq: Two times a day (BID) | ORAL | Status: DC
  Administered 2019-04-29 – 2019-05-03 (×8): 1 via ORAL
  Filled 2019-04-29 (×8): qty 1

## 2019-04-29 MED ORDER — NICOTINE 21 MG/24HR TD PT24
21.0000 mg | MEDICATED_PATCH | Freq: Every day | TRANSDERMAL | Status: DC
  Administered 2019-05-02 – 2019-05-03 (×2): 21 mg via TRANSDERMAL
  Filled 2019-04-29 (×4): qty 1

## 2019-04-29 NOTE — ED Triage Notes (Signed)
Pt to triage via w/c with no distress noted; pt here last night for swelling & pain of LE(s) and SHOB but LWBS due to long wait; pt reports that she doesn't take her meds as rx because "they make her stomach hurt"

## 2019-04-29 NOTE — ED Notes (Signed)
Date and time results received: 04/29/19 0820   Test: Troponin Critical Value: 108  Name of Provider Notified: DR. Joan Mayans  Orders Received? Lasix IV ordered just prior to lab resulting  Or Actions Taken?: Orders Received - See Orders for details

## 2019-04-29 NOTE — Telephone Encounter (Signed)
Noticed she was in the ED this morning.  Contacted the ED and spoke with her.  Explained the program briefly and she states I can call her and come see her next week.  She did explain to call before coming by.  Will attempt next week to see her.   [place] [phone]

## 2019-04-29 NOTE — ED Notes (Signed)
Pt reports she started noticing her legs swelling approx 3-4 days ago. Pt reports she feels pressure in her chest like she can't take a full breath.   Pt presents with cough, producing small amount of saliva. Pt reports cough "just started"

## 2019-04-29 NOTE — H&P (Signed)
History and Physical    Katelyn Lane RSW:[id number] DOB: [birth year] DOA: 04/29/2019  Referring MD/NP/PA:   PCP: [name], MD   Patient coming from:  The patient is coming from home.  At baseline, pt is independent for most of ADL.        Chief Complaint: SOB  HPI: Katelyn Lane is a 64 y.o. female with medical history significant of  sCHF with EF <20%, hyperlipidemia, stroke, depression, CKD-IIIb, polysubstance abuse including cocaine abuse and tobacco abuse, left ventricular thrombus on Xarelto, depression, stroke, positive covid PCR on 2/22, who presents with shortness breath.  Pt states that she has been having shortness breath in the past 3 to 4 days, which has been progressively worsening.  Patient has dry cough, no fever or chills.  Patient does not have chest pain currently, but states that she has chest pressure feeling.  She has bilateral lower leg edema. She states that she has not been taking her medications consistently. Patient admitted that she used cocaine in the past few days.  States that she had nausea and vomited once yesterday, which had resolved.  Currently no nausea, vomiting, diarrhea, abdominal pain, symptoms of UTI or unilateral weakness.  ED Course: pt was found to have troponin 108, BNP>4500, INR 1.1, PTT 28, UDS positive for cocaine, renal function close to baseline, temperature 97.8, blood pressure 119/106, heart rate 80, oxygen saturation 98% on room air.  Chest x-ray showed cardiomegaly and vascular congestion.  Patient is admitted to progressive bed as inpatient.  Review of Systems:   General: no fevers, chills, no body weight gain,  has fatigue HEENT: no blurry vision, hearing changes or sore throat Respiratory: has dyspnea, coughing, no wheezing CV: has chest pressure,  no palpitations GI: no nausea, vomiting, abdominal pain, diarrhea, constipation GU: no dysuria, burning on urination, increased urinary frequency, hematuria  Ext: has leg  edema Neuro: no unilateral weakness, numbness, or tingling, no vision change or hearing loss Skin: no rash, no skin tear. MSK: No muscle spasm, no deformity, no limitation of range of movement in spin Heme: No easy bruising.  Travel history: No recent long distant travel.  Allergy:  Allergies  Allergen Reactions  . Penicillins Hives, Rash and Other (See Comments)    Did it involve swelling of the face/tongue/throat, SOB, or low BP? No Did it involve sudden or severe rash/hives, skin peeling, or any reaction on the inside of your mouth or nose? Yes Did you need to seek medical attention at a hospital or doctor's office? Yes When did it last happen? >25 years If all above answers are "NO", may proceed with cephalosporin use.   . Ace Inhibitors Nausea And Vomiting    Past Medical History:  Diagnosis Date  . CHF (congestive heart failure) ([place])   . COVID-19   . Hyperlipidemia   . Renal disorder     Past Surgical History:  Procedure Laterality Date  . RIGHT/LEFT HEART CATH AND CORONARY ANGIOGRAPHY N/A 12/30/2018   Procedure: RIGHT/LEFT HEART CATH AND CORONARY ANGIOGRAPHY;  Surgeon: Corey Skains, MD;  Location: [facility];  Service: Cardiovascular;  Laterality: N/A;    Social History:  reports that she has been smoking. She has never used smokeless tobacco. She reports current drug use. Drug: Cocaine. She reports that she does not drink alcohol.  Family History:  Family History  Problem Relation Age of Onset  . Breast cancer Neg Hx      Prior to Admission medications  Medication Sig Start Date End Date Taking? Authorizing Provider  albuterol (VENTOLIN HFA) 108 (90 Base) MCG/ACT inhaler Inhale 2 puffs into the lungs every 6 (six) hours as needed for wheezing or shortness of breath. 03/28/19   Enzo Bi, MD  alum & mag hydroxide-simeth (MAALOX/MYLANTA) 200-200-20 MG/5ML suspension Take 30 mLs by mouth every 4 (four) hours as needed for indigestion or  heartburn. 02/16/19   Dhungel, Flonnie Overman, MD  aspirin 81 MG chewable tablet Chew 81 mg by mouth daily.    [provider]  atorvastatin (LIPITOR) 20 MG tablet Take 1 tablet (20 mg total) by mouth daily at 6 PM. 03/28/19 06/26/19  Enzo Bi, MD  carvedilol (COREG) 3.125 MG tablet Take 1 tablet (3.125 mg total) by mouth 2 (two) times daily with a meal. 03/28/19 06/26/19  Enzo Bi, MD  dicyclomine (BENTYL) 10 MG capsule Take 10 mg by mouth 3 (three) times daily as needed. 02/22/19   [provider]  docusate calcium (SURFAK) 240 MG capsule Take 240 mg by mouth as needed.    [provider]  FLUoxetine (PROZAC) 40 MG capsule Take 1 capsule (40 mg total) by mouth daily. 03/28/19 06/26/19  Enzo Bi, MD  furosemide (LASIX) 40 MG tablet Take 2 tablets (80 mg total) by mouth daily. 03/28/19 06/26/19  Enzo Bi, MD  loratadine (CLARITIN) 10 MG tablet Take 10 mg by mouth daily.    [provider]  nitroGLYCERIN (NITROSTAT) 0.4 MG SL tablet Place 1 tablet (0.4 mg total) under the tongue every 5 (five) minutes as needed for chest pain. 02/16/19   Dhungel, Nishant, MD  polyethylene glycol (MIRALAX) 17 g packet Take 17 g by mouth daily. 02/21/19   Nance Pear, MD  rivaroxaban (XARELTO) 20 MG TABS tablet Take 1 tablet (20 mg total) by mouth daily with supper. For the blood clot in your heart. 03/28/19 06/26/19  Enzo Bi, MD  sacubitril-valsartan (ENTRESTO) 24-26 MG Take 1 tablet by mouth 2 (two) times daily. 03/28/19 06/26/19  Enzo Bi, MD  spironolactone (ALDACTONE) 25 MG tablet Take 0.5 tablets (12.5 mg total) by mouth daily. 03/28/19 06/26/19  Enzo Bi, MD  traZODone (DESYREL) 100 MG tablet Take 150 mg by mouth at bedtime as needed for sleep.  09/08/13   [provider]  zinc sulfate 220 (50 Zn) MG capsule Take 1 capsule (220 mg total) by mouth daily. 03/10/19   Max Sane, MD    Physical Exam: Vitals:   04/29/19 0645 04/29/19 0718 04/29/19 0823 04/29/19 0900  BP:  (!) 122/95   (!) 119/106  Pulse: 80   80  Resp:    (!) 23  Temp:  97.8 F (36.6 C)    TempSrc:  Oral    SpO2: 100%   98%  Weight:   103 kg   Height:       General: Not in acute distress HEENT:       Eyes: PERRL, EOMI, no scleral icterus.       ENT: No discharge from the ears and nose, no pharynx injection, no tonsillar enlargement.        Neck: positive JVD, no bruit, no mass felt. Heme: No neck lymph node enlargement. Cardiac: S1/S2, RRR, No murmurs, No gallops or rubs. Respiratory: No rales, wheezing, rhonchi or rubs. GI: Soft, nondistended, nontender, no rebound pain, no organomegaly, BS present. GU: No hematuria Ext: has trace pitting leg edema bilaterally. 2+DP/PT pulse bilaterally. Musculoskeletal: No joint deformities, No joint redness or warmth, no limitation of ROM  in spin. Skin: No rashes.  Neuro: Alert, oriented X3, cranial nerves II-XII grossly intact, moves all extremities normally.  Psych: Patient is not psychotic, no suicidal or hemocidal ideation.  Labs on Admission: I have personally reviewed following labs and imaging studies  CBC: Recent Labs  Lab 04/29/19 0718  WBC 4.6  NEUTROABS 3.0  HGB 11.2*  HCT 38.0  MCV 78.4*  PLT 992   Basic Metabolic Panel: Recent Labs  Lab 04/29/19 0718  NA 143  K 3.6  CL 112*  CO2 23  GLUCOSE 118*  BUN 38*  CREATININE 2.16*  CALCIUM 9.0   GFR: Estimated Creatinine Clearance: 33.5 mL/min (A) (by C-G formula based on SCr of 2.16 mg/dL (H)). Liver Function Tests: Recent Labs  Lab 04/29/19 0718  AST 43*  ALT 55*  ALKPHOS 238*  BILITOT 0.9  PROT 6.6  ALBUMIN 3.2*   No results for input(s): LIPASE, AMYLASE in the last 168 hours. No results for input(s): AMMONIA in the last 168 hours. Coagulation Profile: Recent Labs  Lab 04/29/19 0718  INR 1.1   Cardiac Enzymes: No results for input(s): CKTOTAL, CKMB, CKMBINDEX, TROPONINI in the last 168 hours. BNP (last 3 results) No results for input(s): PROBNP in the last 8760  hours. HbA1C: No results for input(s): HGBA1C in the last 72 hours. CBG: No results for input(s): GLUCAP in the last 168 hours. Lipid Profile: No results for input(s): CHOL, HDL, LDLCALC, TRIG, CHOLHDL, LDLDIRECT in the last 72 hours. Thyroid Function Tests: No results for input(s): TSH, T4TOTAL, FREET4, T3FREE, THYROIDAB in the last 72 hours. Anemia Panel: No results for input(s): VITAMINB12, FOLATE, FERRITIN, TIBC, IRON, RETICCTPCT in the last 72 hours. Urine analysis:    Component Value Date/Time   COLORURINE YELLOW (A) 03/24/2019 2009   APPEARANCEUR CLEAR (A) 03/24/2019 2009   LABSPEC 1.012 03/24/2019 2009   PHURINE 6.0 03/24/2019 2009   GLUCOSEU NEGATIVE 03/24/2019 2009   HGBUR NEGATIVE 03/24/2019 2009   BILIRUBINUR NEGATIVE 03/24/2019 2009   [place] NEGATIVE 03/24/2019 2009   PROTEINUR 100 (A) 03/24/2019 2009   NITRITE NEGATIVE 03/24/2019 2009   LEUKOCYTESUR NEGATIVE 03/24/2019 2009   Sepsis Labs: @LABRCNTIP (procalcitonin:4,lacticidven:4) )No results found for this or any previous visit (from the past 240 hour(s)).   Radiological Exams on Admission: DG Chest 2 View  Result Date: 04/28/2019 CLINICAL DATA:  Shortness of breath. EXAM: CHEST - 2 VIEW COMPARISON:  04/08/2019 FINDINGS: The cardio pericardial silhouette is enlarged. There is pulmonary vascular congestion without overt pulmonary edema. No overt edema. No focal airspace consolidation or pleural effusion. The visualized bony structures of the thorax are intact. IMPRESSION: Enlargement of the cardiopericardial silhouette, similar to prior with vascular congestion. Electronically Signed   By: Misty Stanley M.D.   On: 04/28/2019 19:24   DG Chest Portable 1 View  Result Date: 04/29/2019 CLINICAL DATA:  Cough. Congestive heart failure. EXAM: PORTABLE CHEST 1 VIEW COMPARISON:  One-view chest x-ray 04/28/2019 FINDINGS: The heart is enlarged. Mild pulmonary vascular congestion is similar the prior exam. No frank edema is  present. No definite effusions are present. No airspace consolidation is present. Visualized soft tissues and bony thorax are otherwise unremarkable. IMPRESSION: 1. Cardiomegaly and mild pulmonary vascular congestion without frank edema. 2. No focal airspace disease. Electronically Signed   By: [place] Morelle M.D.   On: 04/29/2019 06:54     EKG: Independently reviewed.  Sinus rhythm, QTC 498, LAE, bifascicular block   Assessment/Plan Principal Problem:   Acute on chronic combined systolic and  diastolic CHF (congestive heart failure) (HCC) Active Problems:   Elevated troponin   Depression   Stroke (HCC)   Cocaine use   Tobacco abuse   LV (left ventricular) mural thrombus   CKD (chronic kidney disease), stage IIIb   Acute on chronic combined systolic and diastolic CHF (congestive heart failure) [facility]):  Patient has trace leg edema and positive JVD. BNP>4500. Chest x-ray showed cardiomegaly and vascular congestion. Clinically consistent with CHF exacerbation.  2D echo on 01/19/2019 showed EF <20.  -Will admit to progressive unit as inpatient -Lasix 40 mg bid by IV (pt received 60 mg Lasix in ED) -trend trop -2d echo -Daily weights -strict I/O's -Low salt diet -Fluid restriction -Obtain REDs Vest reading -continue home Entresto  Elevated troponin: trop 108.  Patient has chronically elevated troponin, recent troponin 40-100.  Possibly due to demand ischemia and cocaine use-Trend troponin -Check A1c, FLP, -Repeat EKG in morning -Continue Lipitor -As needed nitroglycerin -f/u 2d echo  Depression: No SI or HI -Continue home medications  Stroke (HCC) -Lipitor and ASA  Tobacco abuse -Nicotine patch  LV (left ventricular) mural thrombus: -on Xarelto  CKD-IIIb: Close to baseline.  Recent baseline creatinine 1.6-2.3.  His creatinine is at 2.16, BUN 38. -f/u by BMP  Cocaine use: UDS positive for cocaine -Did counseling about importance of quitting cocaine use         DVT ppx: on Xarelto Code Status: Full code Family Communication: not done, no family member is at bed side.    Disposition Plan:  Anticipate discharge back to previous home environment Consults called:  none Admission status:  progressive unit for as inpt     Status is: Inpatient Remains inpatient appropriate because: see below Dispo: The patient is from: Home              Anticipated d/c is to: Home              Anticipated d/c date is: 2 days              Patient currently is not medically stable to d/c.  Inpatient status:  # Patient requires inpatient status due to high intensity of service, high risk for further deterioration and high frequency of surveillance required.  I certify that at the point of admission it is my clinical judgment that the patient will require inpatient hospital care spanning beyond 2 midnights from the point of admission.  . This patient has multiple chronic comorbidities including sCHF with EF <20%, hyperlipidemia, stroke, depression, CKD-IIIb, polysubstance abuse including cocaine abuse and tobacco abuse, left ventricular thrombus on Xarelto, depression, stroke, positive covid PCR on 2/22 . Now patient has presenting with acute on chronic combined systolic and diastolic CHF . The worrisome physical exam findings include bilateral leg edema . The initial radiographic and laboratory data are worrisome because of elevated troponin, BNP >4500 . Current medical needs: please see my assessment and plan . Predictability of an adverse outcome (risk): Patient has multiple comorbidities as listed above. Now presents with acute on chronic combined systolic and diastolic CHF. Patient's presentation is highly complicated. Given her low EF<20%, patient is at high risk of deteriorating.  Will need to be treated in hospital for at least 2 days.              Date of Service 04/29/2019    [facility]   If 7PM-7AM, please contact  night-coverage [email or web address] 04/29/2019, 10:08 AM

## 2019-04-29 NOTE — ED Provider Notes (Signed)
[facility] Emergency Department Provider Note  ____________________________________________   First MD Initiated Contact with Patient 04/29/19 0703     (approximate)  I have reviewed the triage vital signs and the nursing notes.  History  Chief Complaint Leg Swelling    HPI Katelyn Lane is a 64 y.o. female with hx of severe HF (EF <20%), cocaine abuse, CKD, COVID in February 2021, LV thrombus who presents to the emergency department for shortness of breath and bilateral lower extremity edema.  Symptoms have been present for the last 4 to 5 days, constant and slowly worsening. Not improved with her Lasix, worsened with ambulation.  She does admit to inconsistent compliance with her medications as she states her medicines give her an upset stomach.  She has been trying to take her Lasix, but does not feel she is having as robust response as normal.  She does report intermittent chest pains as well, no clear inciting factor.  Does have dyspnea with exertion.  Does admit to cocaine use, last use about 2 days ago.  Last admitted from 3/31-4/1 for acute on chronic heart failure exacerbation.   Past Medical Hx Past Medical History:  Diagnosis Date  . CHF (congestive heart failure) ([place])   . COVID-19   . Hyperlipidemia   . Renal disorder     Problem List Patient Active Problem List   Diagnosis Date Noted  . Acute combined systolic and diastolic CHF, NYHA class 1 ([place]) 04/08/2019  . Acute on chronic combined systolic and diastolic CHF (congestive heart failure) ([place]) 03/24/2019  . COVID-19 03/06/2019  . Acute respiratory failure with hypoxia ([place]) 03/06/2019  . Hypokalemia 03/06/2019  . LV (left ventricular) mural thrombus 03/06/2019  . CKD (chronic kidney disease), stage IIIa 03/06/2019  . Nausea and vomiting 02/16/2019  . Abdominal pain 02/16/2019  . Rhabdomyolysis 02/16/2019  . Acute renal failure superimposed on stage 3 chronic kidney disease ([place])  02/16/2019  . Cocaine use 02/16/2019  . Tobacco abuse 02/16/2019  . Acute on chronic systolic CHF (congestive heart failure) ([place]) 02/16/2019  . Acute congestive heart failure ([place]) 02/13/2019  . Respiratory failure with hypoxia ([place]) 01/31/2019  . Stroke ([place]) 01/18/2019  . Dilated cardiomyopathy ([place]) 12/31/2018  . Chest tightness 12/31/2018  . Elevated troponin 12/31/2018  . Hypertensive urgency 12/31/2018  . Dyslipidemia 12/31/2018  . Depression 12/31/2018  . HFrEF (heart failure with reduced ejection fraction) ([place]) 12/29/2018    Past Surgical Hx Past Surgical History:  Procedure Laterality Date  . RIGHT/LEFT HEART CATH AND CORONARY ANGIOGRAPHY N/A 12/30/2018   Procedure: RIGHT/LEFT HEART CATH AND CORONARY ANGIOGRAPHY;  Surgeon: Corey Skains, MD;  Location: [place] [facility];  Service: Cardiovascular;  Laterality: N/A;    Medications Prior to Admission medications   Medication Sig Start Date End Date Taking? Authorizing Provider  albuterol (VENTOLIN HFA) 108 (90 Base) MCG/ACT inhaler Inhale 2 puffs into the lungs every 6 (six) hours as needed for wheezing or shortness of breath. 03/28/19   Enzo Bi, MD  alum & mag hydroxide-simeth (MAALOX/MYLANTA) 200-200-20 MG/5ML suspension Take 30 mLs by mouth every 4 (four) hours as needed for indigestion or heartburn. 02/16/19   Dhungel, Flonnie Overman, MD  aspirin 81 MG chewable tablet Chew 81 mg by mouth daily.    [provider]  atorvastatin (LIPITOR) 20 MG tablet Take 1 tablet (20 mg total) by mouth daily at 6 PM. 03/28/19 06/26/19  Enzo Bi, MD  carvedilol (COREG) 3.125 MG tablet Take 1 tablet (3.125  mg total) by mouth 2 (two) times daily with a meal. 03/28/19 06/26/19  Enzo Bi, MD  dicyclomine (BENTYL) 10 MG capsule Take 10 mg by mouth 3 (three) times daily as needed. 02/22/19   [provider]  docusate calcium (SURFAK) 240 MG capsule Take 240 mg by mouth as needed.    [provider]  FLUoxetine (PROZAC) 40  MG capsule Take 1 capsule (40 mg total) by mouth daily. 03/28/19 06/26/19  Enzo Bi, MD  furosemide (LASIX) 40 MG tablet Take 2 tablets (80 mg total) by mouth daily. 03/28/19 06/26/19  Enzo Bi, MD  loratadine (CLARITIN) 10 MG tablet Take 10 mg by mouth daily.    [provider]  nitroGLYCERIN (NITROSTAT) 0.4 MG SL tablet Place 1 tablet (0.4 mg total) under the tongue every 5 (five) minutes as needed for chest pain. 02/16/19   Dhungel, Nishant, MD  polyethylene glycol (MIRALAX) 17 g packet Take 17 g by mouth daily. 02/21/19   Nance Pear, MD  rivaroxaban (XARELTO) 20 MG TABS tablet Take 1 tablet (20 mg total) by mouth daily with supper. For the blood clot in your heart. 03/28/19 06/26/19  Enzo Bi, MD  sacubitril-valsartan (ENTRESTO) 24-26 MG Take 1 tablet by mouth 2 (two) times daily. 03/28/19 06/26/19  Enzo Bi, MD  spironolactone (ALDACTONE) 25 MG tablet Take 0.5 tablets (12.5 mg total) by mouth daily. 03/28/19 06/26/19  Enzo Bi, MD  traZODone (DESYREL) 100 MG tablet Take 150 mg by mouth at bedtime as needed for sleep.  09/08/13   [provider]  zinc sulfate 220 (50 Zn) MG capsule Take 1 capsule (220 mg total) by mouth daily. 03/10/19   Max Sane, MD    Allergies Penicillins and Ace inhibitors  Family Hx Family History  Problem Relation Age of Onset  . Breast cancer Neg Hx     Social Hx Social History   Tobacco Use  . Smoking status: Current Every Day Smoker  . Smokeless tobacco: Never Used  Substance Use Topics  . Alcohol use: No    Alcohol/week: 0.0 standard drinks  . Drug use: Yes    Types: Cocaine    Comment: 4-5 days ago     Review of Systems  Constitutional: Negative for fever. Negative for chills. Eyes: Negative for visual changes. ENT: Negative for sore throat. Cardiovascular: Negative for chest pain. Respiratory: + for shortness of breath. Gastrointestinal: Negative for nausea. Negative for vomiting.  Genitourinary: Negative for  dysuria. Musculoskeletal: + for leg swelling. Skin: Negative for rash. Neurological: Negative for headaches.   Physical Exam  Vital Signs: ED Triage Vitals  Enc Vitals Group     BP 04/29/19 0614 (!) 147/100     Pulse Rate 04/29/19 0614 86     Resp 04/29/19 0614 (!) 24     Temp --      Temp src --      SpO2 04/29/19 0614 100 %     Weight 04/29/19 0616 215 lb (97.5 kg)     Height 04/29/19 0616 5\' 8"  (1.727 m)     Head Circumference --      Peak Flow --      Pain Score 04/29/19 0616 8     Pain Loc --      Pain Edu? --      Excl. in [place]? --     Constitutional: Alert and oriented. Well appearing. NAD.  Head: Normocephalic. Atraumatic. Eyes: Conjunctivae clear. Sclera anicteric. Pupils equal and symmetric. Nose: No masses or  lesions. No congestion or rhinorrhea. Mouth/Throat: Wearing mask.  Neck: No stridor. Trachea midline.  Cardiovascular: Normal rate, regular rhythm. Extremities well perfused. Respiratory: RR low to mid 20s.  Lungs with scattered coarse lung sounds bilaterally, right slightly greater than left.  No hypoxia. Gastrointestinal: Soft. Non-distended. Non-tender.  Genitourinary: Deferred. Musculoskeletal: Bilateral, symmetric lower extremity pitting edema to the level of the knees. Neurologic:  Normal speech and language. No gross focal or lateralizing neurologic deficits are appreciated.  Skin: Skin is warm, dry and intact. No rash noted. Psychiatric: Mood and affect are appropriate for situation.  EKG  Personally reviewed and interpreted by myself.   Date: 04/29/19 Time: 0648 Rate: 77 Rhythm: sinus Axis: left  Intervals: QRS 141 ms, QTc 498 ms IVCD Borderline ST changes (~1 mm) in III, aVF, does not meet STEMI criteria PVC    Radiology  Personally reviewed available imaging myself.   CXR  IMPRESSION:  1. Cardiomegaly and mild pulmonary vascular congestion without frank edema.  2. No focal airspace disease.     Procedures  Procedure(s)  performed (including critical care):  Procedures   Initial Impression / Assessment and Plan / MDM / ED Course  64 y.o. female with history of HFrEF (less than 20%) who presents to the ED for shortness of breath and bilateral lower extremity edema.  Ddx: HF exacerbation, ACS, exertional angina, cocaine related  Will plan for labs, imaging, EKG, IV Lasix  Clinical Course as of Apr 29 1742  Wed Apr 29, 2019  0818 HS troponin mildly elevated at 108, slightly increased from prior (86) but near her prior range (~80-100). Likely in setting of demand. IV Lasix ordered. Will plan to admit for further IV diuresis.    [SM]  [phone] Patient has had a positive COVID test w/in the last 90 days (03/06/19) will therefore defer repeat admission testing.   [SM]  0857 BNP > 4500   [SM]    Clinical Course User Index [SM] Lilia Pro., MD     _______________________________   As part of my medical decision making I have reviewed available labs, radiology tests, reviewed old records/performed chart review.    Final Clinical Impression(s) / ED Diagnosis  Final diagnoses:  SOB (shortness of breath)       Note:  This document was prepared using Dragon voice recognition software and may include unintentional dictation errors.   Lilia Pro., MD 04/29/19 [phone]

## 2019-04-30 DIAGNOSIS — I5023 Acute on chronic systolic (congestive) heart failure: Secondary | ICD-10-CM

## 2019-04-30 DIAGNOSIS — F1490 Cocaine use, unspecified, uncomplicated: Secondary | ICD-10-CM

## 2019-04-30 LAB — BASIC METABOLIC PANEL
Anion gap: 9 (ref 5–15)
BUN: 34 mg/dL — ABNORMAL HIGH (ref 8–23)
CO2: 24 mmol/L (ref 22–32)
Calcium: 8.8 mg/dL — ABNORMAL LOW (ref 8.9–10.3)
Chloride: 106 mmol/L (ref 98–111)
Creatinine, Ser: 2.01 mg/dL — ABNORMAL HIGH (ref 0.44–1.00)
GFR calc Af Amer: 30 mL/min — ABNORMAL LOW (ref >60)
GFR calc non Af Amer: 26 mL/min — ABNORMAL LOW (ref >60)
Glucose, Bld: 138 mg/dL — ABNORMAL HIGH (ref 70–99)
Potassium: 4.3 mmol/L (ref 3.5–5.1)
Sodium: 139 mmol/L (ref 135–145)

## 2019-04-30 LAB — LIPID PANEL
Cholesterol: 116 mg/dL (ref 0–200)
HDL: 41 mg/dL (ref >40)
LDL Cholesterol: 58 mg/dL (ref 0–99)
Total CHOL/HDL Ratio: 2.8 RATIO
Triglycerides: 86 mg/dL (ref <150)
VLDL: 17 mg/dL (ref 0–40)

## 2019-04-30 LAB — MAGNESIUM: Magnesium: 2 mg/dL (ref 1.7–2.4)

## 2019-04-30 LAB — HEMOGLOBIN A1C
Hgb A1c MFr Bld: 6.3 % — ABNORMAL HIGH (ref 4.8–5.6)
Mean Plasma Glucose: 134.11 mg/dL

## 2019-04-30 MED ORDER — POLYETHYLENE GLYCOL 3350 17 G PO PACK
17.0000 g | PACK | Freq: Every day | ORAL | Status: DC
  Administered 2019-04-30 – 2019-05-03 (×4): 17 g via ORAL
  Filled 2019-04-30 (×4): qty 1

## 2019-04-30 MED ORDER — DOCUSATE SODIUM 100 MG PO CAPS
100.0000 mg | ORAL_CAPSULE | Freq: Two times a day (BID) | ORAL | Status: DC
  Administered 2019-04-30 – 2019-05-03 (×7): 100 mg via ORAL
  Filled 2019-04-30 (×7): qty 1

## 2019-04-30 MED ORDER — FUROSEMIDE 10 MG/ML IJ SOLN
80.0000 mg | Freq: Two times a day (BID) | INTRAMUSCULAR | Status: DC
  Administered 2019-04-30 – 2019-05-01 (×3): 80 mg via INTRAVENOUS
  Filled 2019-04-30 (×3): qty 8

## 2019-04-30 NOTE — Progress Notes (Signed)
[facility] PROGRESS NOTE    Progress Note  Katelyn Lane  KGM:[id number] DOB: 10/10/1955 DOA: 04/29/2019 PCP: [name], MD     Brief Narrative:   Katelyn Lane is an 64 y.o. female past medical history significant for systolic heart failure with an EF of 20%, CVA, depression chronic kidney disease stage IIIb polysubstance abuse including cocaine alcohol and tobacco with a left ventricular thrombus on Xarelto comes into the hospital for shortness of breath that started 4 days prior to admission he relates a dry cough no fever chills.  Assessment/Plan:   Acute on chronic combined systolic and diastolic CHF (congestive heart failure) ([place]) Unknown estimated dry weight likely 95 kg.  In house she is 98.6 kg. Does not have an estimated or baseline BMP.  On admission was 4500. UDS positive for cocaine.  Probably the cause of her exacerbation. Continue IV Lasix he has had fair diuresis over the 12 hours, replete potassium as needed recheck basic metabolic panel. Monitor with rates advanced. Continue home dose of Entresto.  Elevated troponin: Likely due to demand ischemia, 2D echo is pending. She denies any chest pain, twelve-lead EKG is unchanged from previous.  Depression Continue current home medications.  Stroke [facility]) Continue Lipitor and aspirin.  Nicotine abuse: Continue daily patch.  LV thrombus: Continue Xarelto.  Chronic kidney disease stage IIIb: With a baseline creatinine of 1.7-2 creatinine seems to be close to baseline monitor closely as we diurese.     DVT prophylaxis: xarelto Family Communication:None Status is: Inpatient  Remains inpatient appropriate because:Hemodynamically unstable   Dispo: The patient is from: Home              Anticipated d/c is to: Home              Anticipated d/c date is: > 3 days              Patient currently is not medically stable to d/c.  Code Status:     Code Status Orders  (From admission, onward)          Start     Ordered   04/29/19 0951  Full code  Continuous     04/29/19 0952        Code Status History    Date Active Date Inactive Code Status Order ID Comments User Context   04/08/2019 0950 04/09/2019 1806 Full Code [id number]  Collier Bullock, MD ED   03/24/2019 2343 03/28/2019 1724 Full Code [id number]  Mansy, Arvella Merles, MD ED   03/06/2019 1425 03/10/2019 1658 Full Code [id number]  Ivor Costa, MD Inpatient   02/13/2019 1508 02/16/2019 1846 Full Code [id number]  Max Sane, MD ED   01/31/2019 0815 02/04/2019 1748 Full Code [id number]  Para Skeans, MD ED   01/31/2019 0814 01/31/2019 0815 Full Code [id number]  Para Skeans, MD ED   01/18/2019 0038 01/21/2019 1923 DNR [id number]  Lenore Cordia, MD ED   12/29/2018 0449 01/01/2019 2033 Full Code [id number]  Mansy, Arvella Merles, MD ED   Advance Care Planning Activity        IV Access:    Peripheral IV   Procedures and diagnostic studies:   DG Chest 2 View  Result Date: 04/28/2019 CLINICAL DATA:  Shortness of breath. EXAM: CHEST - 2 VIEW COMPARISON:  04/08/2019 FINDINGS: The cardio pericardial silhouette is enlarged. There is pulmonary vascular congestion without overt pulmonary edema. No overt edema. No focal airspace consolidation or pleural effusion. The visualized bony structures of the  thorax are intact. IMPRESSION: Enlargement of the cardiopericardial silhouette, similar to prior with vascular congestion. Electronically Signed   By: Misty Stanley M.D.   On: 04/28/2019 19:24   DG Chest Portable 1 View  Result Date: 04/29/2019 CLINICAL DATA:  Cough. Congestive heart failure. EXAM: PORTABLE CHEST 1 VIEW COMPARISON:  One-view chest x-ray 04/28/2019 FINDINGS: The heart is enlarged. Mild pulmonary vascular congestion is similar the prior exam. No frank edema is present. No definite effusions are present. No airspace consolidation is present. Visualized soft tissues and bony thorax are otherwise unremarkable. IMPRESSION: 1. Cardiomegaly and mild  pulmonary vascular congestion without frank edema. 2. No focal airspace disease. Electronically Signed   By: San Morelle M.D.   On: 04/29/2019 06:54     Medical Consultants:    None.  Anti-Infectives:   None  Subjective:    Katelyn Lane she relates her breathing is not improved compared to yesterday she denies any chest pain.  Objective:    Vitals:   04/30/19 0309 04/30/19 0311 04/30/19 0334 04/30/19 0809  BP: (!) 149/110  (!) 138/103 (!) 125/94  Pulse: 62  79 (!) 49  Resp: (!) 21   17  Temp: (!) 97.4 F (36.3 C)   98.4 F (36.9 C)  TempSrc:      SpO2: 100%  99% 100%  Weight:  98.6 kg    Height:       SpO2: 100 %   Intake/Output Summary (Last 24 hours) at 04/30/2019 0817 Last data filed at 04/30/2019 0311 Gross per 24 hour  Intake 180 ml  Output 1850 ml  Net -1670 ml   Filed Weights   04/29/19 0616 04/29/19 0823 04/30/19 0311  Weight: 97.5 kg 103 kg 98.6 kg    Exam: General exam: In no acute distress. Respiratory system: Good air movement and crackles at bases bilaterally Cardiovascular system: S1 & S2 heard, RRR.  Positive JVD Gastrointestinal system: Abdomen is nondistended, soft and nontender.  Central nervous system: Alert and oriented. No focal neurological deficits. Extremities: 2+ edema. Skin: No rashes, lesions or ulcers  Data Reviewed:    Labs: Basic Metabolic Panel: Recent Labs  Lab 04/29/19 0718  NA 143  K 3.6  CL 112*  CO2 23  GLUCOSE 118*  BUN 38*  CREATININE 2.16*  CALCIUM 9.0   GFR Estimated Creatinine Clearance: 32.7 mL/min (A) (by C-G formula based on SCr of 2.16 mg/dL (H)). Liver Function Tests: Recent Labs  Lab 04/29/19 0718  AST 43*  ALT 55*  ALKPHOS 238*  BILITOT 0.9  PROT 6.6  ALBUMIN 3.2*   No results for input(s): LIPASE, AMYLASE in the last 168 hours. No results for input(s): AMMONIA in the last 168 hours. Coagulation profile Recent Labs  Lab 04/29/19 0718  INR 1.1   COVID-19 Labs  No  results for input(s): DDIMER, FERRITIN, LDH, CRP in the last 72 hours.  Lab Results  Component Value Date   SARSCOV2NAA POSITIVE (A) 03/06/2019   [place] NEGATIVE 02/13/2019   [place] NEGATIVE 01/31/2019   [place] NEGATIVE 01/18/2019    CBC: Recent Labs  Lab 04/29/19 0718  WBC 4.6  NEUTROABS 3.0  HGB 11.2*  HCT 38.0  MCV 78.4*  PLT 248   Cardiac Enzymes: No results for input(s): CKTOTAL, CKMB, CKMBINDEX, TROPONINI in the last 168 hours. BNP (last 3 results) No results for input(s): PROBNP in the last 8760 hours. CBG: No results for input(s): GLUCAP in the last 168 hours. D-Dimer: No results for input(s): DDIMER in  the last 72 hours. Hgb A1c: No results for input(s): HGBA1C in the last 72 hours. Lipid Profile: No results for input(s): CHOL, HDL, LDLCALC, TRIG, CHOLHDL, LDLDIRECT in the last 72 hours. Thyroid function studies: No results for input(s): TSH, T4TOTAL, T3FREE, THYROIDAB in the last 72 hours.  Invalid input(s): FREET3 Anemia work up: No results for input(s): VITAMINB12, FOLATE, FERRITIN, TIBC, IRON, RETICCTPCT in the last 72 hours. Sepsis Labs: Recent Labs  Lab 04/29/19 0718  WBC 4.6   Microbiology No results found for this or any previous visit (from the past 240 hour(s)).   Medications:   . ascorbic acid  500 mg Oral Daily  . aspirin EC  81 mg Oral Daily  . atorvastatin  20 mg Oral q1800  . carvedilol  3.125 mg Oral BID WC  . docusate sodium  100 mg Oral BID  . FLUoxetine  20 mg Oral Daily  . furosemide  40 mg Intravenous Q12H  . loratadine  10 mg Oral Daily  . melatonin  5 mg Oral QHS  . nicotine  21 mg Transdermal Daily  . polyethylene glycol  17 g Oral Daily  . rivaroxaban  20 mg Oral Q supper  . sacubitril-valsartan  1 tablet Oral BID  . sodium chloride flush  3 mL Intravenous Q12H  . spironolactone  12.5 mg Oral Daily   Continuous Infusions: . sodium chloride        LOS: 1 day   Charlynne Cousins  [facility]  04/30/2019, 8:17 AM

## 2019-04-30 NOTE — Evaluation (Signed)
Physical Therapy Evaluation Patient Details Name: Katelyn Lane MRN: [id number] DOB: 1955/11/01 Today's Date: 04/30/2019   History of Present Illness  Pt is a 64 y.o. female presenting to hospital 4/21 with SOB and B LE edema.  Pt admitted with acute on chronic combined systolic and diastolic CHF.  PMH includes severe HF (EF <20%), cocaine abuse, CKD, COVID (+) Feb 2021, LV thrombus.  Clinical Impression  Prior to hospital admission, pt was independent with ambulation.  Currently pt is independent with bed mobility and transfers; SBA with ambulation 200 feet (no AD but pt did occasionally hold onto railing for support in hallway).  SOB noted with activity (pt's O2 sats 98% or greater on room air during sessions activities).  Pt would benefit from skilled PT to address noted impairments and functional limitations (see below for any additional details).  Upon hospital discharge, no further PT needs anticipated.    Follow Up Recommendations No PT follow up    Equipment Recommendations  None recommended by PT    Recommendations for Other Services       Precautions / Restrictions Precautions Precautions: None Restrictions Weight Bearing Restrictions: No      Mobility  Bed Mobility Overal bed mobility: Independent             General bed mobility comments: Supine to/from sitting without any noted difficulties  Transfers Overall transfer level: Independent Equipment used: None             General transfer comment: steady safe transfers noted  Ambulation/Gait Ambulation/Gait assistance: Supervision Gait Distance (Feet): 200 Feet Assistive device: None   Gait velocity: mildly decreased   General Gait Details: pt occasionally holding onto railing in hallway for support  Stairs            Wheelchair Mobility    Modified Rankin (Stroke Patients Only)       Balance Overall balance assessment: No apparent balance deficits (not formally assessed)(no loss of  balance with ambulation)                                           Pertinent Vitals/Pain Pain Assessment: No/denies pain  HR WFL during sessions activities.    Home Living Family/patient expects to be discharged to:: Private residence Living Arrangements: Alone     Home Access: Stairs to enter Entrance Stairs-Rails: Can reach both;Right;Left Entrance Stairs-Number of Steps: 2 Home Layout: One level        Prior Function Level of Independence: Independent         Comments: Pt denies any recent falls.     Hand Dominance        Extremity/Trunk Assessment   Upper Extremity Assessment Upper Extremity Assessment: Generalized weakness    Lower Extremity Assessment Lower Extremity Assessment: Generalized weakness    Cervical / Trunk Assessment Cervical / Trunk Assessment: Normal  Communication   Communication: No difficulties  Cognition Arousal/Alertness: Awake/alert Behavior During Therapy: WFL for tasks assessed/performed Overall Cognitive Status: Within Functional Limits for tasks assessed                                        General Comments   Nursing cleared pt for participation in physical therapy.  Pt agreeable to PT session.    Exercises  Assessment/Plan    PT Assessment Patient needs continued PT services  PT Problem List Decreased strength;Decreased activity tolerance;Decreased mobility;Cardiopulmonary status limiting activity       PT Treatment Interventions DME instruction;Gait training;Stair training;Functional mobility training;Therapeutic activities;Therapeutic exercise;Balance training;Patient/family education    PT Goals (Current goals can be found in the Care Plan section)  Acute Rehab PT Goals Patient Stated Goal: to improve breathing PT Goal Formulation: With patient Time For Goal Achievement: 05/14/19 Potential to Achieve Goals: Good    Frequency Min 2X/week   Barriers to discharge         Co-evaluation               AM-PAC PT "6 Clicks" Mobility  Outcome Measure Help needed turning from your back to your side while in a flat bed without using bedrails?: None Help needed moving from lying on your back to sitting on the side of a flat bed without using bedrails?: None Help needed moving to and from a bed to a chair (including a wheelchair)?: None Help needed standing up from a chair using your arms (e.g., wheelchair or bedside chair)?: None Help needed to walk in hospital room?: A Little Help needed climbing 3-5 steps with a railing? : A Little 6 Click Score: 22    End of Session   Activity Tolerance: Patient tolerated treatment well Patient left: in bed;with call bell/phone within reach Nurse Communication: Mobility status;Precautions PT Visit Diagnosis: Other abnormalities of gait and mobility (R26.89);Muscle weakness (generalized) (M62.81)    Time: 1400-1411 PT Time Calculation (min) (ACUTE ONLY): 11 min   Charges:   PT Evaluation $PT Eval Low Complexity: 1 Low         Arryanna Holquin, PT 04/30/19, 4:35 PM

## 2019-04-30 NOTE — TOC Progression Note (Signed)
Transition of Care [facility]) - Progression Note    Patient Details  Name: Vernelle Wisner MRN: [id number] Date of Birth: 1955/09/08  Transition of [facility]) CM/SW [place], RN Phone Number: 04/30/2019, 2:06 PM  Clinical Narrative:      Met with patient and provided Substance Abuse information.   She graciously accepted information. No further TOC needs at this time, please re-consult for new needs.         Expected Discharge Plan and Services                                                 Social Determinants of Health (SDOH) Interventions    Readmission Risk Interventions Readmission Risk Prevention Plan 03/27/2019 03/09/2019  Transportation Screening Complete Complete  Medication Review Press photographer) (No Data) Referral to Pharmacy  PCP or Specialist appointment within 3-5 days of discharge Complete Complete  HRI or Home Care Consult Complete -  [facility]/Counseling Consult Complete -  Palliative Care Screening Not Applicable Not [place] Not Applicable Not Applicable  Some recent data might be hidden

## 2019-04-30 NOTE — Evaluation (Signed)
Occupational Therapy Evaluation Patient Details Name: Katelyn Lane MRN: [id number] DOB: [birth year] Today's Date: 04/30/2019    History of Present Illness Pt is a 64 y.o. female presenting to hospital 4/21 with SOB and B LE edema.  Pt admitted with acute on chronic combined systolic and diastolic CHF.  PMH includes severe HF (EF <20%), cocaine abuse, CKD, COVID (+) Feb 2021, LV thrombus.   Clinical Impression   Pt seen for OT evaluation this date. Pt was independent in all ADL and functional mobility prior to admission, living by herself and no home O2 use per pt. Pt reports recently becoming easily fatigued or out of breath with minimal exertion. Pt indep with mobility in room, up ad lib. Tendency to not sit to rest/recovery when she begins to feel SOB, requiring cues to encourage activity pacing. Pt would benefit from additional skilled OT services for energy conservation strategies to support indep/safety with ADL and IADL tasks while minimizing SOB/over exertion. Do not anticipate additional skilled OT needs upon discharge. Will continue to re-assess during future sessions.     Follow Up Recommendations  No OT follow up    Equipment Recommendations  3 in 1 bedside commode    Recommendations for Other Services       Precautions / Restrictions Precautions Precautions: None Restrictions Weight Bearing Restrictions: No      Mobility Bed Mobility Overal bed mobility: Independent             General bed mobility comments: Supine to/from sitting without any noted difficulties  Transfers Overall transfer level: Independent Equipment used: None             General transfer comment: steady safe transfers noted    Balance Overall balance assessment: No apparent balance deficits (not formally assessed)                                         ADL either performed or assessed with clinical judgement   ADL Overall ADL's : Independent                                        General ADL Comments: Pt able to perform basic ADL tasks without assist required.     Vision Patient Visual Report: No change from baseline       Perception     Praxis      Pertinent Vitals/Pain Pain Assessment: No/denies pain     Hand Dominance Left   Extremity/Trunk Assessment Upper Extremity Assessment Upper Extremity Assessment: Generalized weakness   Lower Extremity Assessment Lower Extremity Assessment: Generalized weakness   Cervical / Trunk Assessment Cervical / Trunk Assessment: Normal   Communication Communication Communication: No difficulties   Cognition Arousal/Alertness: Awake/alert Behavior During Therapy: Restless Overall Cognitive Status: Within Functional Limits for tasks assessed                                 General Comments: grossly WFL, cues to direct to session, appears to at times become fatigued and SOB requiring cues for breath recovery and seated rest breaks   General Comments       Exercises Other Exercises Other Exercises: Pt educated in energy conservation strategies and importance of activity pacing and positioning of feet  to minimize swelling/edema and SOB. Pt required cues during session to utilize   Shoulder Instructions      Home Living Family/patient expects to be discharged to:: Private residence Living Arrangements: Alone     Home Access: Stairs to enter Technical brewer of Steps: 2 Entrance Stairs-Rails: Can reach both;Right;Left Home Layout: One level                          Prior Functioning/Environment Level of Independence: Independent        Comments: Pt denies any recent falls. Reports no difficulty with ADL or IADL tasks.        OT Problem List: Decreased activity tolerance;Cardiopulmonary status limiting activity      OT Treatment/Interventions: Self-care/ADL training;Therapeutic exercise;Therapeutic activities;Energy  conservation;DME and/or AE instruction;Patient/family education    OT Goals(Current goals can be found in the care plan section) Acute Rehab OT Goals Patient Stated Goal: not get so SOB OT Goal Formulation: With patient Time For Goal Achievement: 05/14/19 Potential to Achieve Goals: Good ADL Goals Additional ADL Goal #1: Pt will perform morning ADL routine including dressing and grooming while implementing learned ECS to minimize SOB requiring Min verbal cues. Additional ADL Goal #2: Pt will verbalize plan to implement at least 2 learned ECS to maximize safety and independence with ADL and IADL tasks.  OT Frequency: Min 1X/week   Barriers to D/C:            Co-evaluation              AM-PAC OT "6 Clicks" Daily Activity     Outcome Measure Help from another person eating meals?: None Help from another person taking care of personal grooming?: None Help from another person toileting, which includes using toliet, bedpan, or urinal?: None Help from another person bathing (including washing, rinsing, drying)?: None Help from another person to put on and taking off regular upper body clothing?: None Help from another person to put on and taking off regular lower body clothing?: None 6 Click Score: 24   End of Session Nurse Communication: Other (comment)(pt requested phone to call out as her cell ran out of minutes, pt requesting to see cardiologist and to speak with case mgr regarding "finding her a home")  Activity Tolerance: Patient tolerated treatment well Patient left: in bed;with call bell/phone within reach  OT Visit Diagnosis: Other abnormalities of gait and mobility (R26.89)                Time: 1450-1510 OT Time Calculation (min): 20 min Charges:  OT General Charges $OT Visit: 1 Visit OT Evaluation $OT Eval Moderate Complexity: 1 Mod OT Treatments $Self Care/Home Management : 8-22 mins  Jeni Salles, MPH, MS, OTR/L ascom [phone] 04/30/19, 4:43 PM

## 2019-04-30 NOTE — Plan of Care (Signed)
  Problem: Clinical Measurements: Goal: Respiratory complications will improve Outcome: Progressing   Problem: Activity: Goal: Risk for activity intolerance will decrease Outcome: Progressing   

## 2019-05-01 LAB — BASIC METABOLIC PANEL
Anion gap: 10 (ref 5–15)
BUN: 31 mg/dL — ABNORMAL HIGH (ref 8–23)
CO2: 26 mmol/L (ref 22–32)
Calcium: 8.8 mg/dL — ABNORMAL LOW (ref 8.9–10.3)
Chloride: 107 mmol/L (ref 98–111)
Creatinine, Ser: 1.76 mg/dL — ABNORMAL HIGH (ref 0.44–1.00)
GFR calc Af Amer: 35 mL/min — ABNORMAL LOW (ref >60)
GFR calc non Af Amer: 30 mL/min — ABNORMAL LOW (ref >60)
Glucose, Bld: 110 mg/dL — ABNORMAL HIGH (ref 70–99)
Potassium: 3.9 mmol/L (ref 3.5–5.1)
Sodium: 143 mmol/L (ref 135–145)

## 2019-05-01 MED ORDER — METOLAZONE 2.5 MG PO TABS: 2.5000 mg | ORAL_TABLET | Freq: Once | ORAL | Status: DC

## 2019-05-01 NOTE — Progress Notes (Addendum)
[facility] PROGRESS NOTE    Progress Note  Katelyn Lane  QMG:[id number] DOB: [birth year] DOA: 04/29/2019 PCP: [name], MD     Brief Narrative:   Katelyn Lane is an 64 y.o. 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  Assessment/Plan:   Acute on chronic combined systolic and diastolic CHF (congestive heart failure) ([place]) Unknown estimated dry weight likely 95 kg.  In house she is 98.6 kg. Does not have an estimated or baseline BNP.  On admission was 4500. UDS positive for cocaine.  Probably the cause of her exacerbation. I's and O's are poorly recorded, continue IV Lasix 80 mg twice daily, Coreg and Entresto She continues to appear significantly fluid overloaded on physical exam we will continue IV diuresis. She will need to follow-up with [facility] cardiology as an outpatient. I will DC her Aldactone as her GFR is less than 30 mL/min and she is not a reliable patient to follow-up her renal function closely due to her noncompliance.  Elevated troponin: Likely due to demand ischemia in the setting of chronic renal disease, 2D echo is pending. She denies any chest pain, twelve-lead EKG is unchanged from previous.  Depression: Continue current home medications.  Stroke [facility]) Continue Lipitor and aspirin.  Nicotine abuse: Continue daily patch.  LV thrombus: Continue Xarelto.  Chronic kidney disease stage IIIb: With a baseline creatinine of 1.7-2 creatinine seems to be close to  baseline monitor closely as we diurese.     DVT prophylaxis: xarelto Family Communication:None Status is: Inpatient  Remains inpatient appropriate because:Hemodynamically unstable   Dispo: The patient is from: Home              Anticipated d/c is to: Home              Anticipated d/c date is: > 3 days              Patient currently is not medically stable to d/c.  Code Status:     Code Status Orders  (From admission, onward)         Start     Ordered   04/29/19 0951  Full code  Continuous     04/29/19 0952        Code Status History    Date Active Date Inactive Code Status Order ID Comments User Context   04/08/2019 0950 04/09/2019 1806 Full Code [id number]  Collier Bullock, MD ED   03/24/2019 2343 03/28/2019 1724 Full Code [id number]  Mansy, Arvella Merles, MD ED   03/06/2019 1425 03/10/2019 1658 Full Code [id number]  Ivor Costa, MD Inpatient   02/13/2019 1508 02/16/2019 1846 Full Code [id number]  Max Sane, MD ED   01/31/2019 0815 02/04/2019 1748 Full Code [id number]  Para Skeans, MD ED   01/31/2019 0814 01/31/2019 0815 Full Code [id number]  Para Skeans, MD ED   01/18/2019 0038 01/21/2019 1923 DNR [id number]  Lenore Cordia, MD ED   12/29/2018 0449 01/01/2019 2033 Full Code [id number]  Mansy, Arvella Merles, MD ED   Advance Care Planning Activity  IV Access:    Peripheral IV   Procedures and diagnostic studies:   No results found.   Medical Consultants:    None.  Anti-Infectives:   None  Subjective:   Katelyn Lane she relates her breathing is not improved compared to yesterday she denies any chest pain.  Objective:    Vitals:   04/30/19 1652 04/30/19 1958 05/01/19 0427 05/01/19 0747  BP: (!) 128/115 (!) 123/99 (!) 123/93 (!) 131/105  Pulse: 87 93 78 78  Resp: 18 (!) 21  (!) 21  Temp: 97.6 F (36.4 C) 98 F (36.7 C) 97.7 F (36.5 C) 98.2 F (36.8 C)  TempSrc: Oral Oral Oral Oral  SpO2: 100% 100% 99% 97%  Weight:   95.6 kg   Height:       SpO2: 97 % O2 Flow  Rate (L/min): 2 L/min   Intake/Output Summary (Last 24 hours) at 05/01/2019 0843 Last data filed at 05/01/2019 0516 Gross per 24 hour  Intake 660 ml  Output 800 ml  Net -140 ml   Filed Weights   04/29/19 0823 04/30/19 0311 05/01/19 0427  Weight: 103 kg 98.6 kg 95.6 kg    Exam: General exam: In no acute distress. Respiratory system: Good air movement and clear to auscultation. Cardiovascular system: S1 & S2 heard, RRR. No JVD. Gastrointestinal system: Abdomen is nondistended, soft and nontender.  Extremities: No pedal edema. Skin: No rashes, lesions or ulcers  Data Reviewed:    Labs: Basic Metabolic Panel: Recent Labs  Lab 04/29/19 0718 04/30/19 0923  NA 143 139  K 3.6 4.3  CL 112* 106  CO2 23 24  GLUCOSE 118* 138*  BUN 38* 34*  CREATININE 2.16* 2.01*  CALCIUM 9.0 8.8*  MG  --  2.0   GFR Estimated Creatinine Clearance: 34.6 mL/min (A) (by C-G formula based on SCr of 2.01 mg/dL (H)). Liver Function Tests: Recent Labs  Lab 04/29/19 0718  AST 43*  ALT 55*  ALKPHOS 238*  BILITOT 0.9  PROT 6.6  ALBUMIN 3.2*   No results for input(s): LIPASE, AMYLASE in the last 168 hours. No results for input(s): AMMONIA in the last 168 hours. Coagulation profile Recent Labs  Lab 04/29/19 0718  INR 1.1   COVID-19 Labs  No results for input(s): DDIMER, FERRITIN, LDH, CRP in the last 72 hours.  Lab Results  Component Value Date   SARSCOV2NAA POSITIVE (A) 03/06/2019   [place] NEGATIVE 02/13/2019   [place] NEGATIVE 01/31/2019   [place] NEGATIVE 01/18/2019    CBC: Recent Labs  Lab 04/29/19 0718  WBC 4.6  NEUTROABS 3.0  HGB 11.2*  HCT 38.0  MCV 78.4*  PLT 248   Cardiac Enzymes: No results for input(s): CKTOTAL, CKMB, CKMBINDEX, TROPONINI in the last 168 hours. BNP (last 3 results) No results for input(s): PROBNP in the last 8760 hours. CBG: No results for input(s): GLUCAP in the last 168 hours. D-Dimer: No results for input(s): DDIMER in the  last 72 hours. Hgb A1c: Recent Labs    04/30/19 0923  HGBA1C 6.3*   Lipid Profile: Recent Labs    04/30/19 0923  CHOL 116  HDL 41  LDLCALC 58  TRIG 86  CHOLHDL 2.8   Thyroid function studies: No results for input(s): TSH, T4TOTAL, T3FREE, THYROIDAB in the last 72 hours.  Invalid input(s): FREET3 Anemia work up: No results for input(s): VITAMINB12, FOLATE, FERRITIN, TIBC, IRON, RETICCTPCT in the last 72 hours. Sepsis Labs: Recent Labs  Lab 04/29/19 0718  WBC 4.6  Microbiology No results found for this or any previous visit (from the past 240 hour(s)).   Medications:   . ascorbic acid  500 mg Oral Daily  . aspirin EC  81 mg Oral Daily  . atorvastatin  20 mg Oral q1800  . carvedilol  3.125 mg Oral BID WC  . docusate sodium  100 mg Oral BID  . FLUoxetine  20 mg Oral Daily  . furosemide  80 mg Intravenous Q12H  . loratadine  10 mg Oral Daily  . melatonin  5 mg Oral QHS  . nicotine  21 mg Transdermal Daily  . polyethylene glycol  17 g Oral Daily  . rivaroxaban  20 mg Oral Q supper  . sacubitril-valsartan  1 tablet Oral BID  . sodium chloride flush  3 mL Intravenous Q12H  . spironolactone  12.5 mg Oral Daily   Continuous Infusions: . sodium chloride        LOS: 2 days   Charlynne Cousins  [facility]  05/01/2019, 8:43 AM

## 2019-05-01 NOTE — Progress Notes (Addendum)
   05/01/19 1000  Clinical Encounter Type  Visited With Patient  Visit Type Initial;Spiritual support  Referral From Nurse  Consult/Referral To Chaplain  Chaplain visited with patient in response to an OR. Patient requested prayer. Patient has a court order for her living situation coming up and she is not sure of what she is going to do and that her money is funny.  Patient looked up at chaplain, saying this is the first peace I have had in a while. She seem to be at peace while taking to chaplain. The court order is weighing heavy on her mind. Chaplain prayed for God to intercede in patient's housing situation, for healing, and peace, per patient's request.

## 2019-05-02 DIAGNOSIS — I5041 Acute combined systolic (congestive) and diastolic (congestive) heart failure: Secondary | ICD-10-CM

## 2019-05-02 LAB — BASIC METABOLIC PANEL
Anion gap: 9 (ref 5–15)
BUN: 40 mg/dL — ABNORMAL HIGH (ref 8–23)
CO2: 27 mmol/L (ref 22–32)
Calcium: 8.8 mg/dL — ABNORMAL LOW (ref 8.9–10.3)
Chloride: 104 mmol/L (ref 98–111)
Creatinine, Ser: 1.85 mg/dL — ABNORMAL HIGH (ref 0.44–1.00)
GFR calc Af Amer: 33 mL/min — ABNORMAL LOW (ref >60)
GFR calc non Af Amer: 28 mL/min — ABNORMAL LOW (ref >60)
Glucose, Bld: 105 mg/dL — ABNORMAL HIGH (ref 70–99)
Potassium: 3.9 mmol/L (ref 3.5–5.1)
Sodium: 140 mmol/L (ref 135–145)

## 2019-05-02 LAB — CBC
HCT: 35.3 % — ABNORMAL LOW (ref 36.0–46.0)
Hemoglobin: 10.2 g/dL — ABNORMAL LOW (ref 12.0–15.0)
MCH: 22.7 pg — ABNORMAL LOW (ref 26.0–34.0)
MCHC: 28.9 g/dL — ABNORMAL LOW (ref 30.0–36.0)
MCV: 78.6 fL — ABNORMAL LOW (ref 80.0–100.0)
Platelets: 233 10*3/uL (ref 150–400)
RBC: 4.49 MIL/uL (ref 3.87–5.11)
RDW: 20.2 % — ABNORMAL HIGH (ref 11.5–15.5)
WBC: 4 10*3/uL (ref 4.0–10.5)
nRBC: 0 % (ref 0.0–0.2)

## 2019-05-02 MED ORDER — FUROSEMIDE 10 MG/ML IJ SOLN
80.0000 mg | Freq: Two times a day (BID) | INTRAMUSCULAR | Status: DC
  Administered 2019-05-02 – 2019-05-03 (×2): 80 mg via INTRAVENOUS
  Filled 2019-05-02 (×2): qty 8

## 2019-05-02 MED ORDER — ACETAMINOPHEN 500 MG PO TABS
1000.0000 mg | ORAL_TABLET | Freq: Four times a day (QID) | ORAL | Status: DC | PRN
  Administered 2019-05-02: 1000 mg via ORAL
  Filled 2019-05-02: qty 2

## 2019-05-02 MED ORDER — FUROSEMIDE 10 MG/ML IJ SOLN
40.0000 mg | Freq: Two times a day (BID) | INTRAMUSCULAR | Status: DC
  Filled 2019-05-02: qty 4

## 2019-05-02 NOTE — Progress Notes (Signed)
[facility] PROGRESS NOTE    Progress Note  Katelyn Lane  WUJ:[id number] DOB: 04/29/1955 DOA: 04/29/2019 PCP: [name], MD     Brief Narrative:   Katelyn Lane is an 64 y.o. 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  Assessment/Plan:   Acute on chronic combined systolic and diastolic CHF (congestive heart failure) ([place]) Unknown estimated dry weight likely 95 kg.  In house she is 98.6 kg. With moderate diuresis Continue strict I's and O's and daily weights, her creatinine started to improve but there has been a mild increase, continue current dose of Lasix recheck basic metabolic panel in the morning,, continue Coreg and Entresto.  She is not a candidate for Aldactone due to her GFR being less than 30 mL/min and noncompliance with medications and follow-up She will need to follow-up with [facility] cardiology as an outpatient. Blood pressure is well controlled, she denies any dizziness upon standing. Electrolytes are stable.  Elevated troponin: Likely due to demand ischemia in the setting of chronic renal disease, 2D echo is pending. She denies any chest pain, twelve-lead EKG is unchanged from previous.  Depression: Continue current home medications.  Stroke [facility]) Continue Lipitor and aspirin.  Nicotine abuse: Continue daily patch.  LV thrombus: Continue Xarelto.  Chronic kidney disease stage IIIb: With a baseline creatinine of 1.7-2 creatinine seems to be close to baseline  monitor closely as we diurese.     DVT prophylaxis: xarelto Family Communication:None Status is: Inpatient  Remains inpatient appropriate because:Hemodynamically unstable   Dispo: The patient is from: Home              Anticipated d/c is to: Home              Anticipated d/c date is: > 3 days              Patient currently is not medically stable to d/c.  Code Status:     Code Status Orders  (From admission, onward)         Start     Ordered   04/29/19 0951  Full code  Continuous     04/29/19 0952        Code Status History    Date Active Date Inactive Code Status Order ID Comments User Context   04/08/2019 0950 04/09/2019 1806 Full Code [id number]  Collier Bullock, MD ED   03/24/2019 2343 03/28/2019 1724 Full Code [id number]  Mansy, Arvella Merles, MD ED   03/06/2019 1425 03/10/2019 1658 Full Code [id number]  Ivor Costa, MD Inpatient   02/13/2019 1508 02/16/2019 1846 Full Code [id number]  Max Sane, MD ED   01/31/2019 0815 02/04/2019 1748 Full Code [id number]  Para Skeans, MD ED   01/31/2019 0814 01/31/2019 0815 Full Code [id number]  Para Skeans, MD ED   01/18/2019 0038 01/21/2019 1923 DNR [id number]  Lenore Cordia, MD ED   12/29/2018 0449 01/01/2019 2033 Full Code [id number]  Mansy, Arvella Merles, MD ED   Advance Care Planning Activity        IV Access:  Peripheral IV   Procedures and diagnostic studies:   No results found.   Medical Consultants:    None.  Anti-Infectives:   None  Subjective:   [name] she denies any shortness of breath, she relates her orthopnea has resolved. Objective:    Vitals:   05/01/19 1537 05/01/19 1938 05/02/19 0401 05/02/19 0752  BP: 94/70 103/72 94/74 96/73   Pulse: 77 74 78 75  Resp: 16 20 20 17   Temp: 97.8 F (36.6 C) 97.7 F (36.5 C) 98 F (36.7 C) (!) 97.5 F (36.4 C)  TempSrc:  Oral Oral Oral  SpO2: 95% 99% 100% 97%  Weight:   97.3 kg   Height:       SpO2: 97 % O2 Flow Rate (L/min): 2 L/min   Intake/Output Summary (Last  24 hours) at 05/02/2019 0759 Last data filed at 05/02/2019 0402 Gross per 24 hour  Intake 240 ml  Output 1050 ml  Net -810 ml   Filed Weights   04/30/19 0311 05/01/19 0427 05/02/19 0401  Weight: 98.6 kg 95.6 kg 97.3 kg    Exam: General exam: In no acute distress. Respiratory system: Good air movement and clear to auscultation. Cardiovascular system: S1 & S2 heard, RRR.  Positive JVD Gastrointestinal system: Abdomen is nondistended, soft and nontender.  Extremities: Trace edema Skin: No rashes, lesions or ulcers  Data Reviewed:    Labs: Basic Metabolic Panel: Recent Labs  Lab 04/29/19 0718 04/29/19 0718 04/30/19 0923 04/30/19 0923 05/01/19 0905 05/02/19 0531  NA 143  --  139  --  143 140  K 3.6   < > 4.3   < > 3.9 3.9  CL 112*  --  106  --  107 104  CO2 23  --  24  --  26 27  GLUCOSE 118*  --  138*  --  110* 105*  BUN 38*  --  34*  --  31* 40*  CREATININE 2.16*  --  2.01*  --  1.76* 1.85*  CALCIUM 9.0  --  8.8*  --  8.8* 8.8*  MG  --   --  2.0  --   --   --    < > = values in this interval not displayed.   GFR Estimated Creatinine Clearance: 38 mL/min (A) (by C-G formula based on SCr of 1.85 mg/dL (H)). Liver Function Tests: Recent Labs  Lab 04/29/19 0718  AST 43*  ALT 55*  ALKPHOS 238*  BILITOT 0.9  PROT 6.6  ALBUMIN 3.2*   No results for input(s): LIPASE, AMYLASE in the last 168 hours. No results for input(s): AMMONIA in the last 168 hours. Coagulation profile Recent Labs  Lab 04/29/19 0718  INR 1.1   COVID-19 Labs  No results for input(s): DDIMER, FERRITIN, LDH, CRP in the last 72 hours.  Lab Results  Component Value Date   SARSCOV2NAA POSITIVE (A) 03/06/2019   [place] NEGATIVE 02/13/2019   [place] NEGATIVE 01/31/2019   [place] NEGATIVE 01/18/2019    CBC: Recent Labs  Lab 04/29/19 0718 05/02/19 0531  WBC 4.6 4.0  NEUTROABS 3.0  --   HGB 11.2* 10.2*  HCT 38.0 35.3*  MCV 78.4* 78.6*  PLT 248 233   Cardiac Enzymes: No  results for input(s): CKTOTAL, CKMB, CKMBINDEX, TROPONINI in the last 168 hours. BNP (last 3 results) No results for input(s): PROBNP in the last 8760 hours. CBG: No results for input(s): GLUCAP in the last 168 hours. D-Dimer: No results for input(s): DDIMER in the  last 72 hours. Hgb A1c: Recent Labs    04/30/19 0923  HGBA1C 6.3*   Lipid Profile: Recent Labs    04/30/19 0923  CHOL 116  HDL 41  LDLCALC 58  TRIG 86  CHOLHDL 2.8   Thyroid function studies: No results for input(s): TSH, T4TOTAL, T3FREE, THYROIDAB in the last 72 hours.  Invalid input(s): FREET3 Anemia work up: No results for input(s): VITAMINB12, FOLATE, FERRITIN, TIBC, IRON, RETICCTPCT in the last 72 hours. Sepsis Labs: Recent Labs  Lab 04/29/19 0718 05/02/19 0531  WBC 4.6 4.0   Microbiology No results found for this or any previous visit (from the past 240 hour(s)).   Medications:   . ascorbic acid  500 mg Oral Daily  . aspirin EC  81 mg Oral Daily  . atorvastatin  20 mg Oral q1800  . carvedilol  3.125 mg Oral BID WC  . docusate sodium  100 mg Oral BID  . FLUoxetine  20 mg Oral Daily  . furosemide  40 mg Intravenous Q12H  . loratadine  10 mg Oral Daily  . melatonin  5 mg Oral QHS  . nicotine  21 mg Transdermal Daily  . polyethylene glycol  17 g Oral Daily  . rivaroxaban  20 mg Oral Q supper  . sacubitril-valsartan  1 tablet Oral BID  . sodium chloride flush  3 mL Intravenous Q12H   Continuous Infusions: . sodium chloride        LOS: 3 days   Charlynne Cousins  [facility]  05/02/2019, 7:59 AM

## 2019-05-02 NOTE — Plan of Care (Signed)
  Problem: Education: Goal: Knowledge of General Education information will improve Description: Including pain rating scale, medication(s)/side effects and non-pharmacologic comfort measures Outcome: Progressing   Problem: Health Behavior/Discharge Planning: Goal: Ability to manage health-related needs will improve Outcome: Not Progressing Note: Patient c/o chest "discomfort" this afternoon, administered one SL Nitro-Stat at that time. Discomfort subsequently relieved. Will continue to monitor for pain / respiratory complications for the remainder of the shift. Wenda Low [facility]

## 2019-05-03 LAB — BASIC METABOLIC PANEL
Anion gap: 9 (ref 5–15)
BUN: 39 mg/dL — ABNORMAL HIGH (ref 8–23)
CO2: 30 mmol/L (ref 22–32)
Calcium: 9.1 mg/dL (ref 8.9–10.3)
Chloride: 101 mmol/L (ref 98–111)
Creatinine, Ser: 1.96 mg/dL — ABNORMAL HIGH (ref 0.44–1.00)
GFR calc Af Amer: 31 mL/min — ABNORMAL LOW (ref >60)
GFR calc non Af Amer: 27 mL/min — ABNORMAL LOW (ref >60)
Glucose, Bld: 116 mg/dL — ABNORMAL HIGH (ref 70–99)
Potassium: 4.1 mmol/L (ref 3.5–5.1)
Sodium: 140 mmol/L (ref 135–145)

## 2019-05-03 MED ORDER — CARVEDILOL 3.125 MG PO TABS: 3.1250 mg | ORAL_TABLET | Freq: Two times a day (BID) | ORAL | 2 refills | Status: DC

## 2019-05-03 MED ORDER — NITROGLYCERIN 0.4 MG SL SUBL: 0.4000 mg | SUBLINGUAL_TABLET | SUBLINGUAL | 1 refills | Status: DC | PRN

## 2019-05-03 MED ORDER — ENTRESTO 24-26 MG PO TABS: 1.0000 | ORAL_TABLET | Freq: Two times a day (BID) | ORAL | 2 refills | Status: DC

## 2019-05-03 MED ORDER — ATORVASTATIN CALCIUM 20 MG PO TABS: 20.0000 mg | ORAL_TABLET | Freq: Every day | ORAL | 2 refills | Status: DC

## 2019-05-03 MED ORDER — FLUOXETINE HCL 20 MG PO CAPS: 20.0000 mg | ORAL_CAPSULE | Freq: Every day | ORAL | 3 refills | Status: DC

## 2019-05-03 MED ORDER — RIVAROXABAN 20 MG PO TABS: 20.0000 mg | ORAL_TABLET | Freq: Every day | ORAL | 2 refills | Status: DC

## 2019-05-03 MED ORDER — FUROSEMIDE 40 MG PO TABS: 80.0000 mg | ORAL_TABLET | Freq: Every day | ORAL | 2 refills | Status: DC

## 2019-05-03 MED ORDER — PANTOPRAZOLE SODIUM 40 MG PO TBEC
40.0000 mg | DELAYED_RELEASE_TABLET | Freq: Every day | ORAL | 0 refills | Status: DC
Start: 2019-05-03 — End: 2020-01-29

## 2019-05-03 MED ORDER — ASPIRIN 81 MG PO CHEW: 81.0000 mg | CHEWABLE_TABLET | Freq: Every day | ORAL | 0 refills | Status: DC

## 2019-05-03 NOTE — Progress Notes (Signed)
Written and verbal discharge instructions discussed with pt. Discussed medication, follow-up appt., HF education and when to call MD or return to ER. Pt verbalized understanding. IV and tele dc'd. (pt pulled IV out herself). Belongings with pt. Prescriptions given to pt. Pt to car via wheelchair by volunteer.

## 2019-05-03 NOTE — Discharge Summary (Addendum)
Physician Discharge Summary  Genisis Lane YYT:[id number] DOB: May 28, 1955 DOA: 04/29/2019  PCP: [name], MD  Admit date: 04/29/2019 Discharge date: 05/03/2019  Admitted From: home Disposition:  Home  Recommendations for Outpatient Follow-up:  1. Follow up with PCP in 1-2 weeks 2. Follow-up with cardiology in 1 week check a basic metabolic panel.   Home Health:No Equipment/Devices:None  Discharge Condition:Stable CODE STATUS:Full Diet recommendation: Heart Healthy   Brief/Interim Summary: 64 y.o. 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  Discharge Diagnoses:  Principal Problem:   Acute on chronic combined systolic and diastolic CHF (congestive heart failure) (HCC) Active Problems:   Elevated troponin   Depression   Stroke (HCC)   Cocaine use   Tobacco abuse   LV (left ventricular) mural thrombus   CKD (chronic kidney disease), stage IIIb  Acute on chronic combined systolic and dilated and diastolic heart failure: Unknown estimated dry weight on admission she was 98.6 kg.  Her UDS was positive for cocaine which is likely the cause of her exacerbation. She was started on IV Lasix her Aldactone was DC'd. She diuresed over 5 L she was continued on her Coreg and her Entresto and her electrolytes remained stable. The Aldactone has been DC'd as she is not a candidate due to her GFR being less than 30 mL/min and noncompliance with her medications and follow-ups. She was continue on Coreg  Entresto and Lasix.  Elevated troponins: Likely demand ischemia in the setting of chronic renal disease 2D echo is pending at the time of this dictation.  Depression: Continue current home regimen no changes made.  History of CVA: Continue present statin.  History LV thrombus: Continue Xarelto.  Chronic kidney disease stage IIIb: With a baseline creatinine running 1.7-2.0 her creatinine remained stable with diuresis.  Polysubstance abuse: She has been counseled.  Discharge Instructions  Discharge Instructions    AMB referral to CHF clinic   Complete by: As directed    Amb Referral to Cardiac Rehabilitation   Complete by: As directed    Diagnosis: Heart Failure (see criteria below if ordering Phase II)   Heart Failure Type: Chronic Systolic & Diastolic   After initial evaluation and assessments completed: Virtual Based Care may be provided alone or in conjunction with Phase 2 Cardiac Rehab based on patient barriers.: Yes   Diet - low sodium heart healthy   Complete by: As directed    Increase activity slowly   Complete by: As directed      Allergies as of 05/03/2019      Reactions   Penicillins Hives, Rash, Other (See Comments)   Did it involve swelling of the face/tongue/throat, SOB, or low BP? No Did it involve sudden or severe rash/hives, skin peeling, or any reaction on the inside of your mouth or nose? Yes Did you need to seek medical attention at a hospital or doctor's office? Yes When did it last happen? >25 years If all above answers are "NO", may proceed with cephalosporin use.   Ace Inhibitors Nausea And Vomiting      Medication  List    STOP taking these medications   alum & mag hydroxide-simeth 200-200-20 MG/5ML suspension Commonly known as: MAALOX/MYLANTA   spironolactone 25 MG tablet Commonly known as: ALDACTONE     TAKE these medications   albuterol 108 (90 Base) MCG/ACT inhaler Commonly known as: VENTOLIN HFA Inhale 2 puffs into the lungs  every 6 (six) hours as needed for wheezing or shortness of breath.   ascorbic acid 500 MG tablet Commonly known as: VITAMIN C Take 500 mg by mouth daily.   aspirin 81 MG chewable tablet Chew 1 tablet (81 mg total) by mouth daily.   atorvastatin 20 MG tablet Commonly known as: LIPITOR Take 1 tablet (20 mg total) by mouth daily at 6 PM.   carvedilol 3.125 MG tablet Commonly known as: COREG Take 1 tablet (3.125 mg total) by mouth 2 (two) times daily with a meal.   dicyclomine 20 MG tablet Commonly known as: BENTYL Take 20 mg by mouth 3 (three) times daily as needed.   docusate calcium 240 MG capsule Commonly known as: SURFAK Take 240 mg by mouth daily as needed for moderate constipation.   Entresto 24-26 MG Generic drug: sacubitril-valsartan Take 1 tablet by mouth 2 (two) times daily.   FLUoxetine 40 MG capsule Commonly known as: PROZAC Take 1 capsule (40 mg total) by mouth daily.   FLUoxetine 20 MG capsule Commonly known as: PROZAC Take 1 capsule (20 mg total) by mouth daily.   furosemide 40 MG tablet Commonly known as: Lasix Take 2 tablets (80 mg total) by mouth daily.   loratadine 10 MG tablet Commonly known as: CLARITIN Take 10 mg by mouth daily.   magnesium citrate Soln Take 1 Bottle by mouth as directed.   nitroGLYCERIN 0.4 MG SL tablet Commonly known as: NITROSTAT Place 1 tablet (0.4 mg total) under the tongue every 5 (five) minutes as needed for chest pain.   pantoprazole 40 MG tablet Commonly known as: Protonix Take 1 tablet (40 mg total) by mouth daily.   polyethylene glycol 17 g packet Commonly known as: MiraLax Take 17 g by mouth daily.   rivaroxaban 20 MG Tabs tablet Commonly known as: XARELTO Take 1 tablet (20 mg total) by mouth daily with supper. For the blood clot in your heart.   zinc sulfate 220 (50 Zn) MG capsule Take 1 capsule (220 mg total) by mouth daily.      Follow-up Information    [facility] Follow up on 05/07/2019.   Specialty: Cardiology Why: at 8:30am. Enter through the [facility] entrance Contact information: [place]       [facility] Follow up in 1 week(s).   Specialty: Cardiology Contact information: [place] [id number] [place] [address] [phone]         Allergies  Allergen Reactions  . Penicillins Hives, Rash and Other (See Comments)    Did it involve swelling of the face/tongue/throat, SOB, or low BP? No Did it involve sudden or severe rash/hives, skin peeling, or any reaction on the inside of your mouth or nose? Yes Did you need to seek medical attention at a hospital or doctor's office? Yes When did it last happen? >25 years If all above answers are "NO", may proceed with cephalosporin use.   . Ace Inhibitors Nausea And Vomiting    Consultations:  None   Procedures/Studies: DG Chest 2 View  Result  Date: 04/28/2019 CLINICAL DATA:  Shortness of breath. EXAM: CHEST - 2 VIEW COMPARISON:  04/08/2019 FINDINGS: The cardio pericardial silhouette is enlarged. There is pulmonary vascular congestion without overt pulmonary edema. No overt edema. No focal airspace consolidation or pleural effusion. The visualized bony structures of the thorax are intact. IMPRESSION: Enlargement of the cardiopericardial silhouette, similar to prior with vascular congestion. Electronically Signed   By: Misty Stanley M.D.   On: 04/28/2019 19:24   DG Chest 2 View  Result Date: 04/08/2019 CLINICAL DATA:  Dyspnea EXAM: CHEST - 2 VIEW COMPARISON:  03/24/2019 FINDINGS: Cardiomegaly. Vascular congestion that appears similar to prior. No visible effusion or pneumothorax. IMPRESSION: Cardiomegaly and vascular congestion. Electronically Signed   By: Monte Fantasia M.D.   On: 04/08/2019 07:40   DG Chest Portable 1 View  Result  Date: 04/29/2019 CLINICAL DATA:  Cough. Congestive heart failure. EXAM: PORTABLE CHEST 1 VIEW COMPARISON:  One-view chest x-ray 04/28/2019 FINDINGS: The heart is enlarged. Mild pulmonary vascular congestion is similar the prior exam. No frank edema is present. No definite effusions are present. No airspace consolidation is present. Visualized soft tissues and bony thorax are otherwise unremarkable. IMPRESSION: 1. Cardiomegaly and mild pulmonary vascular congestion without frank edema. 2. No focal airspace disease. Electronically Signed   By: San Morelle M.D.   On: 04/29/2019 06:54    Subjective: No complaints feels great.  Discharge Exam: Vitals:   05/03/19 0445 05/03/19 0808  BP: 123/89 (!) 110/92  Pulse: 73 69  Resp: 15 19  Temp: 98.1 F (36.7 C) (!) 97.5 F (36.4 C)  SpO2: 100% 97%   Vitals:   05/02/19 1954 05/03/19 0357 05/03/19 0445 05/03/19 0808  BP: 101/77  123/89 (!) 110/92  Pulse: 70  73 69  Resp: 18  15 19   Temp: 98 F (36.7 C)  98.1 F (36.7 C) (!) 97.5 F (36.4 C)  TempSrc: Oral  Oral Oral  SpO2: 100%  100% 97%  Weight:  98.5 kg    Height:        General: Pt is alert, awake, not in acute distress Cardiovascular: RRR, S1/S2 +, no rubs, no gallops Respiratory: CTA bilaterally, no wheezing, no rhonchi Abdominal: Soft, NT, ND, bowel sounds + Extremities: no edema, no cyanosis    The results of significant diagnostics from this hospitalization (including imaging, microbiology, ancillary and laboratory) are listed below for reference.     Microbiology: No results found for this or any previous visit (from the past 240 hour(s)).   Labs: BNP (last 3 results) Recent Labs    03/24/19 1752 04/08/19 0737 04/29/19 0718  BNP 4,174.0* >4,500.0* >4,008.6*   Basic Metabolic Panel: Recent Labs  Lab 04/29/19 0718 04/30/19 0923 05/01/19 0905 05/02/19 0531 05/03/19 0751  NA 143 139 143 140 140  K 3.6 4.3 3.9 3.9 4.1  CL 112* 106 107 104 101  CO2 23 24  26 27 30   GLUCOSE 118* 138* 110* 105* 116*  BUN 38* 34* 31* 40* 39*  CREATININE 2.16* 2.01* 1.76* 1.85* 1.96*  CALCIUM 9.0 8.8* 8.8* 8.8* 9.1  MG  --  2.0  --   --   --    Liver Function Tests: Recent Labs  Lab 04/29/19 0718  AST 43*  ALT 55*  ALKPHOS 238*  BILITOT 0.9  PROT 6.6  ALBUMIN 3.2*   No results for input(s): LIPASE, AMYLASE in the last 168 hours. No results for input(s): AMMONIA in the last 168 hours. CBC: Recent Labs  Lab  04/29/19 0718 05/02/19 0531  WBC 4.6 4.0  NEUTROABS 3.0  --   HGB 11.2* 10.2*  HCT 38.0 35.3*  MCV 78.4* 78.6*  PLT 248 233   Cardiac Enzymes: No results for input(s): CKTOTAL, CKMB, CKMBINDEX, TROPONINI in the last 168 hours. BNP: Invalid input(s): POCBNP CBG: No results for input(s): GLUCAP in the last 168 hours. D-Dimer No results for input(s): DDIMER in the last 72 hours. Hgb A1c No results for input(s): HGBA1C in the last 72 hours. Lipid Profile No results for input(s): CHOL, HDL, LDLCALC, TRIG, CHOLHDL, LDLDIRECT in the last 72 hours. Thyroid function studies No results for input(s): TSH, T4TOTAL, T3FREE, THYROIDAB in the last 72 hours.  Invalid input(s): FREET3 Anemia work up No results for input(s): VITAMINB12, FOLATE, FERRITIN, TIBC, IRON, RETICCTPCT in the last 72 hours. Urinalysis    Component Value Date/Time   COLORURINE YELLOW (A) 03/24/2019 2009   APPEARANCEUR CLEAR (A) 03/24/2019 2009   LABSPEC 1.012 03/24/2019 2009   PHURINE 6.0 03/24/2019 2009   GLUCOSEU NEGATIVE 03/24/2019 2009   HGBUR NEGATIVE 03/24/2019 2009   [place] NEGATIVE 03/24/2019 2009   [place] NEGATIVE 03/24/2019 2009   PROTEINUR 100 (A) 03/24/2019 2009   NITRITE NEGATIVE 03/24/2019 2009   LEUKOCYTESUR NEGATIVE 03/24/2019 2009   Sepsis Labs Invalid input(s): PROCALCITONIN,  WBC,  LACTICIDVEN Microbiology No results found for this or any previous visit (from the past 240 hour(s)).   Time coordinating discharge: Over 40  minutes  SIGNED:   Charlynne Cousins, MD  [facility] 05/03/2019, 12:49 PM Pager   If 7PM-7AM, please contact night-coverage [email or web address] Password [email or web address]

## 2019-05-04 ENCOUNTER — Telehealth (HOSPITAL_COMMUNITY): Payer: Self-pay

## 2019-05-04 ENCOUNTER — Telehealth: Payer: Self-pay | Admitting: Family

## 2019-05-04 NOTE — Telephone Encounter (Signed)
Attempted to contact Katelyn Lane, no answer and mail box is not set up.  Will continue to try to contact to set up a home visit.  [place] [phone]

## 2019-05-04 NOTE — Telephone Encounter (Signed)
Spoke with patient who said she still not doing well since she left the hospital. She is having a lot of swelling today but is taking medications as she is suppose to, following a low sodium diet, walking in the yard for exercise, and checking her weight daily. She confirmed her new patient CHF Clinic appointment for 4/29.   Alyse Low, [place]

## 2019-05-05 ENCOUNTER — Ambulatory Visit: Payer: Medicaid Other | Admitting: Gastroenterology

## 2019-05-07 ENCOUNTER — Telehealth: Payer: Self-pay | Admitting: Family

## 2019-05-07 ENCOUNTER — Ambulatory Visit: Payer: Medicaid Other | Admitting: Family

## 2019-05-07 NOTE — Telephone Encounter (Signed)
Patient did not show for her Heart Failure Clinic appointment on 05/07/19. Will attempt to reschedule.

## 2019-05-11 ENCOUNTER — Telehealth (HOSPITAL_COMMUNITY): Payer: Self-pay

## 2019-05-11 NOTE — Telephone Encounter (Signed)
Was able to contact Katelyn Lane, she states out of town to attempt to contact her next week.    [place] [phone]

## 2019-05-15 LAB — ECHOCARDIOGRAM COMPLETE
Height: 68 in
Weight: 3633.18 oz

## 2019-05-18 ENCOUNTER — Telehealth (HOSPITAL_COMMUNITY): Payer: Self-pay

## 2019-05-18 NOTE — Telephone Encounter (Signed)
Attempted to contact, no answer.  Unable to leave message voice mail not set up.  Will continue to try to reach.   [place] [phone]

## 2019-05-20 ENCOUNTER — Other Ambulatory Visit: Payer: Self-pay

## 2019-05-20 ENCOUNTER — Emergency Department: Payer: Medicaid Other

## 2019-05-20 ENCOUNTER — Emergency Department
Admission: EM | Admit: 2019-05-20 | Discharge: 2019-05-20 | Disposition: A | Discharge status: Home / Self Care | Payer: Medicaid Other | Source: Home / Self Care | Attending: Emergency Medicine | Admitting: Emergency Medicine

## 2019-05-20 DIAGNOSIS — I5023 Acute on chronic systolic (congestive) heart failure: Secondary | ICD-10-CM | POA: Insufficient documentation

## 2019-05-20 DIAGNOSIS — F17200 Nicotine dependence, unspecified, uncomplicated: Secondary | ICD-10-CM | POA: Insufficient documentation

## 2019-05-20 DIAGNOSIS — I509 Heart failure, unspecified: Secondary | ICD-10-CM

## 2019-05-20 DIAGNOSIS — N183 Chronic kidney disease, stage 3 unspecified: Secondary | ICD-10-CM | POA: Insufficient documentation

## 2019-05-20 DIAGNOSIS — Z79899 Other long term (current) drug therapy: Secondary | ICD-10-CM | POA: Insufficient documentation

## 2019-05-20 LAB — CBC
HCT: 35 % — ABNORMAL LOW (ref 36.0–46.0)
Hemoglobin: 10.4 g/dL — ABNORMAL LOW (ref 12.0–15.0)
MCH: 22.6 pg — ABNORMAL LOW (ref 26.0–34.0)
MCHC: 29.7 g/dL — ABNORMAL LOW (ref 30.0–36.0)
MCV: 75.9 fL — ABNORMAL LOW (ref 80.0–100.0)
Platelets: 383 10*3/uL (ref 150–400)
RBC: 4.61 MIL/uL (ref 3.87–5.11)
RDW: 20.7 % — ABNORMAL HIGH (ref 11.5–15.5)
WBC: 6.6 10*3/uL (ref 4.0–10.5)
nRBC: 0 % (ref 0.0–0.2)

## 2019-05-20 LAB — BASIC METABOLIC PANEL
Anion gap: 9 (ref 5–15)
BUN: 54 mg/dL — ABNORMAL HIGH (ref 8–23)
CO2: 29 mmol/L (ref 22–32)
Calcium: 8.7 mg/dL — ABNORMAL LOW (ref 8.9–10.3)
Chloride: 108 mmol/L (ref 98–111)
Creatinine, Ser: 2.3 mg/dL — ABNORMAL HIGH (ref 0.44–1.00)
GFR calc Af Amer: 25 mL/min — ABNORMAL LOW (ref >60)
GFR calc non Af Amer: 22 mL/min — ABNORMAL LOW (ref >60)
Glucose, Bld: 124 mg/dL — ABNORMAL HIGH (ref 70–99)
Potassium: 4.1 mmol/L (ref 3.5–5.1)
Sodium: 146 mmol/L — ABNORMAL HIGH (ref 135–145)

## 2019-05-20 MED ORDER — FUROSEMIDE 10 MG/ML IJ SOLN
40.0000 mg | Freq: Once | INTRAMUSCULAR | Status: AC
  Administered 2019-05-20: 40 mg via INTRAVENOUS
  Filled 2019-05-20: qty 4

## 2019-05-20 NOTE — ED Provider Notes (Signed)
[facility] Emergency Department Provider Note   ____________________________________________    I have reviewed the triage vital signs and the nursing notes.   HISTORY  Chief Complaint Congestive Heart Failure     HPI Katelyn Lane is a 64 y.o. female with history of CHF who presents with complaints of shortness of breath.  Patient reports her breathing feels more labored with exertion over the last several days.  She denies chest pain.  No fevers or chills or cough.  Mild leg swelling.  Reports she has been taking her Lasix.  Does admit to dietary indiscretion.  Feels okay at rest  Past Medical History:  Diagnosis Date  . CHF (congestive heart failure) ([place])   . COVID-19   . Hyperlipidemia   . Renal disorder     Patient Active Problem List   Diagnosis Date Noted  . Acute combined systolic and diastolic CHF, NYHA class 1 ([place]) 04/08/2019  . Acute on chronic combined systolic and diastolic CHF (congestive heart failure) ([place]) 03/24/2019  . COVID-19 03/06/2019  . Acute respiratory failure with hypoxia ([place]) 03/06/2019  . Hypokalemia 03/06/2019  . LV (left ventricular) mural thrombus 03/06/2019  . CKD (chronic kidney disease), stage IIIb 03/06/2019  . Nausea and vomiting 02/16/2019  . Abdominal pain 02/16/2019  . Rhabdomyolysis 02/16/2019  . Acute renal failure superimposed on stage 3 chronic kidney disease ([place]) 02/16/2019  . Cocaine use 02/16/2019  . Tobacco abuse 02/16/2019  . Acute on chronic systolic CHF (congestive heart failure) ([place]) 02/16/2019  . Acute congestive heart failure ([place]) 02/13/2019  . Respiratory failure with hypoxia ([place]) 01/31/2019  . Stroke ([place]) 01/18/2019  . Dilated cardiomyopathy ([place]) 12/31/2018  . Chest tightness 12/31/2018  . Elevated troponin 12/31/2018  . Hypertensive urgency 12/31/2018  . Dyslipidemia 12/31/2018  . Depression 12/31/2018  . HFrEF (heart failure with reduced ejection fraction) ([place])  12/29/2018    Past Surgical History:  Procedure Laterality Date  . RIGHT/LEFT HEART CATH AND CORONARY ANGIOGRAPHY N/A 12/30/2018   Procedure: RIGHT/LEFT HEART CATH AND CORONARY ANGIOGRAPHY;  Surgeon: Corey Skains, MD;  Location: [facility];  Service: Cardiovascular;  Laterality: N/A;    Prior to Admission medications   Medication Sig Start Date End Date Taking? Authorizing Provider  albuterol (VENTOLIN HFA) 108 (90 Base) MCG/ACT inhaler Inhale 2 puffs into the lungs every 6 (six) hours as needed for wheezing or shortness of breath. 03/28/19   Enzo Bi, MD  ascorbic acid (VITAMIN C) 500 MG tablet Take 500 mg by mouth daily.    [provider]  aspirin 81 MG chewable tablet Chew 1 tablet (81 mg total) by mouth daily. 05/03/19   Charlynne Cousins, MD  atorvastatin (LIPITOR) 20 MG tablet Take 1 tablet (20 mg total) by mouth daily at 6 PM. 05/03/19 08/01/19  Charlynne Cousins, MD  carvedilol (COREG) 3.125 MG tablet Take 1 tablet (3.125 mg total) by mouth 2 (two) times daily with a meal. 05/03/19 08/01/19  Charlynne Cousins, MD  dicyclomine (BENTYL) 20 MG tablet Take 20 mg by mouth 3 (three) times daily as needed.  02/22/19   [provider]  docusate calcium (SURFAK) 240 MG capsule Take 240 mg by mouth daily as needed for moderate constipation.     [provider]  FLUoxetine (PROZAC) 20 MG capsule Take 1 capsule (20 mg total) by mouth daily. 05/03/19   Charlynne Cousins, MD  FLUoxetine (PROZAC) 40 MG capsule Take 1 capsule (40 mg  total) by mouth daily. 03/28/19 06/26/19  Enzo Bi, MD  furosemide (LASIX) 40 MG tablet Take 2 tablets (80 mg total) by mouth daily. 05/03/19 08/01/19  Charlynne Cousins, MD  loratadine (CLARITIN) 10 MG tablet Take 10 mg by mouth daily.    [provider]  magnesium citrate SOLN Take 1 Bottle by mouth as directed.    [provider]  nitroGLYCERIN (NITROSTAT) 0.4 MG SL tablet Place 1 tablet (0.4 mg total)  under the tongue every 5 (five) minutes as needed for chest pain. 05/03/19   Charlynne Cousins, MD  pantoprazole (PROTONIX) 40 MG tablet Take 1 tablet (40 mg total) by mouth daily. 05/03/19   Charlynne Cousins, MD  polyethylene glycol (MIRALAX) 17 g packet Take 17 g by mouth daily. 02/21/19   Nance Pear, MD  rivaroxaban (XARELTO) 20 MG TABS tablet Take 1 tablet (20 mg total) by mouth daily with supper. For the blood clot in your heart. 05/03/19 08/01/19  Charlynne Cousins, MD  sacubitril-valsartan (ENTRESTO) 24-26 MG Take 1 tablet by mouth 2 (two) times daily. 05/03/19 08/01/19  Charlynne Cousins, MD  zinc sulfate 220 (50 Zn) MG capsule Take 1 capsule (220 mg total) by mouth daily. 03/10/19   Max Sane, MD     Allergies Penicillins and Ace inhibitors  Family History  Problem Relation Age of Onset  . Breast cancer Neg Hx     Social History Social History   Tobacco Use  . Smoking status: Current Every Day Smoker  . Smokeless tobacco: Never Used  Substance Use Topics  . Alcohol use: No    Alcohol/week: 0.0 standard drinks  . Drug use: Yes    Types: Cocaine    Comment: 4-5 days ago    Review of Systems  Constitutional: No fever/chills Eyes: No visual changes.  ENT: No sore throat. Cardiovascular: Denies chest pain. Respiratory: As above Gastrointestinal: No abdominal pain.   Genitourinary: Negative for dysuria. Musculoskeletal: Negative for back pain. Skin: Negative for rash. Neurological: Negative for headaches    ____________________________________________   PHYSICAL EXAM:  VITAL SIGNS: ED Triage Vitals  Enc Vitals Group     BP 05/20/19 1759 (!) 143/112     Pulse Rate 05/20/19 1759 87     Resp 05/20/19 1759 16     Temp 05/20/19 1759 97.9 F (36.6 C)     Temp Source 05/20/19 1759 Oral     SpO2 05/20/19 1759 98 %     Weight 05/20/19 1800 98.9 kg (218 lb)     Height 05/20/19 1800 1.727 m (5\' 8" )     Head Circumference --      Peak Flow --       Pain Score 05/20/19 1800 8     Pain Loc --      Pain Edu? --      Excl. in [place]? --     Constitutional: Alert and oriented.   Nose: No congestion/rhinnorhea. Mouth/Throat: Mucous membranes are moist.   Neck:  Painless ROM Cardiovascular: Normal rate, regular rhythm. Grossly normal heart sounds.  Good peripheral circulation. Respiratory: Normal respiratory effort.  No retractions.  Basilar rales mild Gastrointestinal: Soft and nontender. No distention.  No CVA tenderness.  Musculoskeletal: Mild 1+ edema.  Warm and well perfused Neurologic:  Normal speech and language. No gross focal neurologic deficits are appreciated.  Skin:  Skin is warm, dry and intact. No rash noted. Psychiatric: Mood and affect are normal. Speech and behavior are normal.  ____________________________________________  LABS (all labs ordered are listed, but only abnormal results are displayed)  Labs Reviewed  CBC - Abnormal; Notable for the following components:      Result Value   Hemoglobin 10.4 (*)    HCT 35.0 (*)    MCV 75.9 (*)    MCH 22.6 (*)    MCHC 29.7 (*)    RDW 20.7 (*)    All other components within normal limits  BASIC METABOLIC PANEL - Abnormal; Notable for the following components:   Sodium 146 (*)    Glucose, Bld 124 (*)    BUN 54 (*)    Creatinine, Ser 2.30 (*)    Calcium 8.7 (*)    GFR calc non Af Amer 22 (*)    GFR calc Af Amer 25 (*)    All other components within normal limits   ____________________________________________  EKG  None ____________________________________________  RADIOLOGY  Chest x-ray demonstrates cardiomegaly, reviewed by me, no acute edema ____________________________________________   PROCEDURES  Procedure(s) performed: No  Procedures   Critical Care performed: No ____________________________________________   INITIAL IMPRESSION / ASSESSMENT AND PLAN / ED COURSE  Pertinent labs & imaging results that were available during my care of the  patient were reviewed by me and considered in my medical decision making (see chart for details).  Patient with a history of CHF presents with complaints of mild shortness of breath with exertion only.  No chest pain.  Differential includes acute on chronic CHF, pneumonia, substance abuse given her history.  Chest x-ray is overall quite reassuring.  Patient does have BUN of 54 and creatinine of 2.3, this is in line with her chronic kidney disease.  We will give low-dose of IV Lasix here given kidney disease.  Patient has been observed in the emergency department and is satting 100% without increased work of breathing.  Discussed with her that she is appropriate for discharge with close follow-up with our CHF clinic tomorrow.  Return precautions discussed at length.    ____________________________________________   FINAL CLINICAL IMPRESSION(S) / ED DIAGNOSES  Final diagnoses:  Acute on chronic congestive heart failure, unspecified heart failure type [facility])        Note:  This document was prepared using Dragon voice recognition software and may include unintentional dictation errors.   Lavonia Drafts, MD 05/20/19 2212

## 2019-05-20 NOTE — ED Notes (Signed)
P taken to tx room via wheelchair- pt having intermitted coughing- pt requesting a warm blanket- warm blanket provided and lights turned down per pt request

## 2019-05-20 NOTE — ED Triage Notes (Signed)
First Nurse Note:  C/O fluid retention x 2-3 days.  Patient arrives, DOE noted.  Improves with rest.

## 2019-05-20 NOTE — ED Triage Notes (Signed)
Pt here with "fluid" and states that she was holding off for a week before coming in. Pt coughing in triage.

## 2019-05-21 ENCOUNTER — Other Ambulatory Visit: Payer: Self-pay

## 2019-05-21 ENCOUNTER — Inpatient Hospital Stay
Admission: EM | Admit: 2019-05-21 | Discharge: 2019-05-26 | DRG: 291 | Disposition: A | Discharge status: Home / Self Care | Payer: Medicaid Other | Attending: Internal Medicine | Admitting: Internal Medicine

## 2019-05-21 ENCOUNTER — Telehealth (HOSPITAL_COMMUNITY): Payer: Self-pay

## 2019-05-21 ENCOUNTER — Ambulatory Visit: Payer: Medicaid Other | Admitting: Family

## 2019-05-21 ENCOUNTER — Encounter: Payer: Self-pay | Admitting: Family

## 2019-05-21 ENCOUNTER — Inpatient Hospital Stay: Payer: Medicaid Other

## 2019-05-21 ENCOUNTER — Emergency Department: Payer: Medicaid Other

## 2019-05-21 VITALS — BP 152/139 | HR 91 | Resp 24 | Ht 68.0 in | Wt 218.0 lb

## 2019-05-21 DIAGNOSIS — J441 Chronic obstructive pulmonary disease with (acute) exacerbation: Secondary | ICD-10-CM | POA: Diagnosis present

## 2019-05-21 DIAGNOSIS — Z888 Allergy status to other drugs, medicaments and biological substances status: Secondary | ICD-10-CM

## 2019-05-21 DIAGNOSIS — Z79899 Other long term (current) drug therapy: Secondary | ICD-10-CM

## 2019-05-21 DIAGNOSIS — I130 Hypertensive heart and chronic kidney disease with heart failure and stage 1 through stage 4 chronic kidney disease, or unspecified chronic kidney disease: Secondary | ICD-10-CM | POA: Diagnosis not present

## 2019-05-21 DIAGNOSIS — N179 Acute kidney failure, unspecified: Secondary | ICD-10-CM

## 2019-05-21 DIAGNOSIS — I5041 Acute combined systolic (congestive) and diastolic (congestive) heart failure: Secondary | ICD-10-CM

## 2019-05-21 DIAGNOSIS — Z8673 Personal history of transient ischemic attack (TIA), and cerebral infarction without residual deficits: Secondary | ICD-10-CM

## 2019-05-21 DIAGNOSIS — E669 Obesity, unspecified: Secondary | ICD-10-CM | POA: Diagnosis present

## 2019-05-21 DIAGNOSIS — N1831 Chronic kidney disease, stage 3a: Secondary | ICD-10-CM

## 2019-05-21 DIAGNOSIS — Z9114 Patient's other noncompliance with medication regimen: Secondary | ICD-10-CM

## 2019-05-21 DIAGNOSIS — Z7982 Long term (current) use of aspirin: Secondary | ICD-10-CM | POA: Insufficient documentation

## 2019-05-21 DIAGNOSIS — I420 Dilated cardiomyopathy: Secondary | ICD-10-CM | POA: Diagnosis present

## 2019-05-21 DIAGNOSIS — R0602 Shortness of breath: Secondary | ICD-10-CM

## 2019-05-21 DIAGNOSIS — I10 Essential (primary) hypertension: Secondary | ICD-10-CM

## 2019-05-21 DIAGNOSIS — Z6831 Body mass index (BMI) 31.0-31.9, adult: Secondary | ICD-10-CM | POA: Diagnosis not present

## 2019-05-21 DIAGNOSIS — I272 Pulmonary hypertension, unspecified: Secondary | ICD-10-CM

## 2019-05-21 DIAGNOSIS — Z880 Allergy status to penicillin: Secondary | ICD-10-CM

## 2019-05-21 DIAGNOSIS — F329 Major depressive disorder, single episode, unspecified: Secondary | ICD-10-CM | POA: Diagnosis present

## 2019-05-21 DIAGNOSIS — Z7901 Long term (current) use of anticoagulants: Secondary | ICD-10-CM | POA: Insufficient documentation

## 2019-05-21 DIAGNOSIS — J9601 Acute respiratory failure with hypoxia: Secondary | ICD-10-CM | POA: Diagnosis present

## 2019-05-21 DIAGNOSIS — G9341 Metabolic encephalopathy: Secondary | ICD-10-CM | POA: Diagnosis present

## 2019-05-21 DIAGNOSIS — I452 Bifascicular block: Secondary | ICD-10-CM | POA: Diagnosis present

## 2019-05-21 DIAGNOSIS — R400 Somnolence: Secondary | ICD-10-CM

## 2019-05-21 DIAGNOSIS — I5043 Acute on chronic combined systolic (congestive) and diastolic (congestive) heart failure: Secondary | ICD-10-CM | POA: Diagnosis present

## 2019-05-21 DIAGNOSIS — F1490 Cocaine use, unspecified, uncomplicated: Secondary | ICD-10-CM | POA: Diagnosis not present

## 2019-05-21 DIAGNOSIS — I5021 Acute systolic (congestive) heart failure: Secondary | ICD-10-CM | POA: Diagnosis not present

## 2019-05-21 DIAGNOSIS — Z720 Tobacco use: Secondary | ICD-10-CM

## 2019-05-21 DIAGNOSIS — I509 Heart failure, unspecified: Secondary | ICD-10-CM

## 2019-05-21 DIAGNOSIS — F1911 Other psychoactive substance abuse, in remission: Secondary | ICD-10-CM

## 2019-05-21 DIAGNOSIS — E785 Hyperlipidemia, unspecified: Secondary | ICD-10-CM

## 2019-05-21 DIAGNOSIS — I472 Ventricular tachycardia: Secondary | ICD-10-CM | POA: Diagnosis not present

## 2019-05-21 DIAGNOSIS — F17210 Nicotine dependence, cigarettes, uncomplicated: Secondary | ICD-10-CM | POA: Diagnosis present

## 2019-05-21 DIAGNOSIS — I4729 Other ventricular tachycardia: Secondary | ICD-10-CM

## 2019-05-21 DIAGNOSIS — J449 Chronic obstructive pulmonary disease, unspecified: Secondary | ICD-10-CM

## 2019-05-21 DIAGNOSIS — I5022 Chronic systolic (congestive) heart failure: Secondary | ICD-10-CM | POA: Insufficient documentation

## 2019-05-21 DIAGNOSIS — I5023 Acute on chronic systolic (congestive) heart failure: Secondary | ICD-10-CM | POA: Diagnosis not present

## 2019-05-21 DIAGNOSIS — I513 Intracardiac thrombosis, not elsewhere classified: Secondary | ICD-10-CM | POA: Diagnosis present

## 2019-05-21 DIAGNOSIS — N1832 Chronic kidney disease, stage 3b: Secondary | ICD-10-CM | POA: Diagnosis present

## 2019-05-21 DIAGNOSIS — Z8616 Personal history of COVID-19: Secondary | ICD-10-CM

## 2019-05-21 DIAGNOSIS — N189 Chronic kidney disease, unspecified: Secondary | ICD-10-CM | POA: Insufficient documentation

## 2019-05-21 DIAGNOSIS — I5020 Unspecified systolic (congestive) heart failure: Secondary | ICD-10-CM

## 2019-05-21 LAB — COMPREHENSIVE METABOLIC PANEL
ALT: 33 U/L (ref 0–44)
AST: 44 U/L — ABNORMAL HIGH (ref 15–41)
Albumin: 3.2 g/dL — ABNORMAL LOW (ref 3.5–5.0)
Alkaline Phosphatase: 223 U/L — ABNORMAL HIGH (ref 38–126)
Anion gap: 10 (ref 5–15)
BUN: 46 mg/dL — ABNORMAL HIGH (ref 8–23)
CO2: 26 mmol/L (ref 22–32)
Calcium: 9 mg/dL (ref 8.9–10.3)
Chloride: 106 mmol/L (ref 98–111)
Creatinine, Ser: 1.93 mg/dL — ABNORMAL HIGH (ref 0.44–1.00)
GFR calc Af Amer: 31 mL/min — ABNORMAL LOW (ref >60)
GFR calc non Af Amer: 27 mL/min — ABNORMAL LOW (ref >60)
Glucose, Bld: 154 mg/dL — ABNORMAL HIGH (ref 70–99)
Potassium: 4 mmol/L (ref 3.5–5.1)
Sodium: 142 mmol/L (ref 135–145)
Total Bilirubin: 1.4 mg/dL — ABNORMAL HIGH (ref 0.3–1.2)
Total Protein: 6.7 g/dL (ref 6.5–8.1)

## 2019-05-21 LAB — CBC
HCT: 35.6 % — ABNORMAL LOW (ref 36.0–46.0)
Hemoglobin: 10.5 g/dL — ABNORMAL LOW (ref 12.0–15.0)
MCH: 22.6 pg — ABNORMAL LOW (ref 26.0–34.0)
MCHC: 29.5 g/dL — ABNORMAL LOW (ref 30.0–36.0)
MCV: 76.6 fL — ABNORMAL LOW (ref 80.0–100.0)
Platelets: 291 10*3/uL (ref 150–400)
RBC: 4.65 MIL/uL (ref 3.87–5.11)
RDW: 20.4 % — ABNORMAL HIGH (ref 11.5–15.5)
WBC: 6.5 10*3/uL (ref 4.0–10.5)
nRBC: 0.3 % — ABNORMAL HIGH (ref 0.0–0.2)

## 2019-05-21 LAB — URINALYSIS, COMPLETE (UACMP) WITH MICROSCOPIC
Bacteria, UA: NONE SEEN
Bilirubin Urine: NEGATIVE
Glucose, UA: NEGATIVE mg/dL
Hgb urine dipstick: NEGATIVE
Ketones, ur: NEGATIVE mg/dL
Leukocytes,Ua: NEGATIVE
Nitrite: NEGATIVE
Protein, ur: 30 mg/dL — AB
Specific Gravity, Urine: 1.01 (ref 1.005–1.030)
pH: 7 (ref 5.0–8.0)

## 2019-05-21 LAB — TROPONIN I (HIGH SENSITIVITY)
Troponin I (High Sensitivity): 123 ng/L (ref <18)
Troponin I (High Sensitivity): 126 ng/L (ref <18)

## 2019-05-21 LAB — URINE DRUG SCREEN, QUALITATIVE (ARMC ONLY)
Amphetamines, Ur Screen: NOT DETECTED
Barbiturates, Ur Screen: NOT DETECTED
Benzodiazepine, Ur Scrn: NOT DETECTED
Cannabinoid 50 Ng, Ur ~~LOC~~: NOT DETECTED
Cocaine Metabolite,Ur ~~LOC~~: POSITIVE — AB
MDMA (Ecstasy)Ur Screen: NOT DETECTED
Methadone Scn, Ur: NOT DETECTED
Opiate, Ur Screen: NOT DETECTED
Phencyclidine (PCP) Ur S: NOT DETECTED
Tricyclic, Ur Screen: NOT DETECTED

## 2019-05-21 LAB — BLOOD GAS, ARTERIAL
Acid-Base Excess: 2.4 mmol/L — ABNORMAL HIGH (ref 0.0–2.0)
Bicarbonate: 25 mmol/L (ref 20.0–28.0)
FIO2: 0.21
O2 Saturation: 97.4 %
Patient temperature: 37
pCO2 arterial: 32 mmHg (ref 32.0–48.0)
pH, Arterial: 7.5 — ABNORMAL HIGH (ref 7.350–7.450)
pO2, Arterial: 87 mmHg (ref 83.0–108.0)

## 2019-05-21 LAB — APTT: aPTT: 27 seconds (ref 24–36)

## 2019-05-21 LAB — BRAIN NATRIURETIC PEPTIDE: B Natriuretic Peptide: >4500 pg/mL — ABNORMAL HIGH (ref 0.0–100.0)

## 2019-05-21 LAB — PROTIME-INR
INR: 1.4 — ABNORMAL HIGH (ref 0.8–1.2)
Prothrombin Time: 16.5 seconds — ABNORMAL HIGH (ref 11.4–15.2)

## 2019-05-21 LAB — HEPARIN LEVEL (UNFRACTIONATED): Heparin Unfractionated: 0.58 IU/mL (ref 0.30–0.70)

## 2019-05-21 LAB — GLUCOSE, CAPILLARY: Glucose-Capillary: 148 mg/dL — ABNORMAL HIGH (ref 70–99)

## 2019-05-21 MED ORDER — ASPIRIN 300 MG RE SUPP: 300.0000 mg | RECTAL | Status: AC

## 2019-05-21 MED ORDER — FUROSEMIDE 10 MG/ML IJ SOLN
40.0000 mg | Freq: Two times a day (BID) | INTRAMUSCULAR | Status: DC
  Administered 2019-05-21 – 2019-05-22 (×2): 40 mg via INTRAVENOUS
  Filled 2019-05-21 (×2): qty 4

## 2019-05-21 MED ORDER — NITROGLYCERIN 0.4 MG SL SUBL: 0.4000 mg | SUBLINGUAL_TABLET | SUBLINGUAL | Status: DC | PRN

## 2019-05-21 MED ORDER — ASPIRIN 81 MG PO CHEW: 81.0000 mg | CHEWABLE_TABLET | Freq: Every day | ORAL | Status: DC

## 2019-05-21 MED ORDER — SACUBITRIL-VALSARTAN 24-26 MG PO TABS
1.0000 | ORAL_TABLET | Freq: Two times a day (BID) | ORAL | Status: DC
  Administered 2019-05-21 – 2019-05-26 (×10): 1 via ORAL
  Filled 2019-05-21 (×11): qty 1

## 2019-05-21 MED ORDER — ASPIRIN EC 81 MG PO TBEC
81.0000 mg | DELAYED_RELEASE_TABLET | Freq: Every day | ORAL | Status: DC
  Administered 2019-05-22 – 2019-05-26 (×5): 81 mg via ORAL
  Filled 2019-05-21 (×5): qty 1

## 2019-05-21 MED ORDER — HEPARIN (PORCINE) 25000 UT/250ML-% IV SOLN
800.0000 [IU]/h | INTRAVENOUS | Status: DC
  Administered 2019-05-21 – 2019-05-22 (×2): 1000 [IU]/h via INTRAVENOUS
  Filled 2019-05-21 (×2): qty 250

## 2019-05-21 MED ORDER — CARVEDILOL 3.125 MG PO TABS
3.1250 mg | ORAL_TABLET | Freq: Two times a day (BID) | ORAL | Status: DC
  Administered 2019-05-22 – 2019-05-26 (×9): 3.125 mg via ORAL
  Filled 2019-05-21 (×9): qty 1

## 2019-05-21 MED ORDER — ATORVASTATIN CALCIUM 20 MG PO TABS
20.0000 mg | ORAL_TABLET | Freq: Every day | ORAL | Status: DC
  Administered 2019-05-22 – 2019-05-25 (×4): 20 mg via ORAL
  Filled 2019-05-21 (×4): qty 1

## 2019-05-21 MED ORDER — ASPIRIN 81 MG PO CHEW
324.0000 mg | CHEWABLE_TABLET | ORAL | Status: AC
  Administered 2019-05-21: 324 mg via ORAL
  Filled 2019-05-21: qty 4

## 2019-05-21 MED ORDER — ONDANSETRON HCL 4 MG/2ML IJ SOLN
4.0000 mg | Freq: Four times a day (QID) | INTRAMUSCULAR | Status: DC | PRN
  Administered 2019-05-23 – 2019-05-24 (×3): 4 mg via INTRAVENOUS
  Filled 2019-05-21 (×3): qty 2

## 2019-05-21 MED ORDER — FUROSEMIDE 10 MG/ML IJ SOLN: 80.0000 mg | Freq: Once | INTRAMUSCULAR | Status: AC

## 2019-05-21 MED ORDER — FUROSEMIDE 10 MG/ML IJ SOLN
INTRAMUSCULAR | Status: AC
  Administered 2019-05-21: 80 mg via INTRAVENOUS
  Filled 2019-05-21: qty 8

## 2019-05-21 MED ORDER — ACETAMINOPHEN 325 MG PO TABS: 650.0000 mg | ORAL_TABLET | ORAL | Status: DC | PRN

## 2019-05-21 NOTE — ED Notes (Signed)
Date and time results received: 05/21/19 3:37 PM    Test: troponin Critical Value: 123 Name of Provider Notified: Dr. Nance Pear

## 2019-05-21 NOTE — ED Notes (Signed)
Dr. Duncan at bedside 

## 2019-05-21 NOTE — ED Provider Notes (Signed)
[facility] Emergency Department Provider Note   ____________________________________________  I have reviewed the triage vital signs and the nursing notes.  HISTORY  Chief Complaint Weakness  History limited by and level 5 caveat due to: poor historian  HPI Katelyn Lane is a 64 y.o. female who presents to the emergency department today from heart failure clinic. The patient herself is quite somnolent on exam and cannot give a good history as to what the concerns were from the heart failure clinic. She will awaken to verbal stimuli and give brief responses to questions. However she does state that she has been short of breath and feels like she has fluid on her. She was seen in the emergency department yesterday for fluid concerns. States she did not take her medication this morning because it was cold in her house and she got "mixed up." the patient says that she weighed herself yesterday and had a weight of 218. She is unsure of her goal weight. She denies any fevers.   Records reviewed. Per medical record review patient was seen in the emergency department yesterday and treated for mild shortness of breath and CHF. Given IV lasix. Followed up at heart failure clinic today and sent to ED from that office.   Past Medical History:  Diagnosis Date  . CHF (congestive heart failure) ([place])   . COVID-19   . Hyperlipidemia   . Hypertension   . Renal disorder     Patient Active Problem List   Diagnosis Date Noted  . Acute combined systolic and diastolic CHF, NYHA class 1 ([place]) 04/08/2019  . Acute on chronic combined systolic and diastolic CHF (congestive heart failure) ([place]) 03/24/2019  . COVID-19 03/06/2019  . Acute respiratory failure with hypoxia ([place]) 03/06/2019  . Hypokalemia 03/06/2019  . LV (left ventricular) mural thrombus 03/06/2019  . CKD (chronic kidney disease), stage IIIb 03/06/2019  . Nausea and vomiting 02/16/2019  . Abdominal pain 02/16/2019  .  Rhabdomyolysis 02/16/2019  . Acute renal failure superimposed on stage 3 chronic kidney disease ([place]) 02/16/2019  . Cocaine use 02/16/2019  . Tobacco abuse 02/16/2019  . Acute on chronic systolic CHF (congestive heart failure) ([place]) 02/16/2019  . Acute congestive heart failure ([place]) 02/13/2019  . Respiratory failure with hypoxia ([place]) 01/31/2019  . Stroke ([place]) 01/18/2019  . Dilated cardiomyopathy ([place]) 12/31/2018  . Chest tightness 12/31/2018  . Elevated troponin 12/31/2018  . Hypertensive urgency 12/31/2018  . Dyslipidemia 12/31/2018  . Depression 12/31/2018  . HFrEF (heart failure with reduced ejection fraction) ([place]) 12/29/2018    Past Surgical History:  Procedure Laterality Date  . RIGHT/LEFT HEART CATH AND CORONARY ANGIOGRAPHY N/A 12/30/2018   Procedure: RIGHT/LEFT HEART CATH AND CORONARY ANGIOGRAPHY;  Surgeon: Corey Skains, MD;  Location: [facility];  Service: Cardiovascular;  Laterality: N/A;    Prior to Admission medications   Medication Sig Start Date End Date Taking? Authorizing Provider  albuterol (VENTOLIN HFA) 108 (90 Base) MCG/ACT inhaler Inhale 2 puffs into the lungs every 6 (six) hours as needed for wheezing or shortness of breath. Patient not taking: Reported on 05/21/2019 03/28/19   Enzo Bi, MD  ascorbic acid (VITAMIN C) 500 MG tablet Take 500 mg by mouth daily.    [provider]  aspirin 81 MG chewable tablet Chew 1 tablet (81 mg total) by mouth daily. Patient not taking: Reported on 05/21/2019 05/03/19   Charlynne Cousins, MD  atorvastatin (LIPITOR) 20 MG tablet Take 1 tablet (20 mg total)  by mouth daily at 6 PM. Patient not taking: Reported on 05/21/2019 05/03/19 08/01/19  Charlynne Cousins, MD  carvedilol (COREG) 3.125 MG tablet Take 1 tablet (3.125 mg total) by mouth 2 (two) times daily with a meal. Patient not taking: Reported on 05/21/2019 05/03/19 08/01/19  Charlynne Cousins, MD  dicyclomine (BENTYL) 20 MG tablet Take 20 mg by  mouth 3 (three) times daily as needed.  02/22/19   [provider]  docusate calcium (SURFAK) 240 MG capsule Take 240 mg by mouth daily as needed for moderate constipation.     [provider]  FLUoxetine (PROZAC) 20 MG capsule Take 1 capsule (20 mg total) by mouth daily. Patient not taking: Reported on 05/21/2019 05/03/19   Charlynne Cousins, MD  FLUoxetine (PROZAC) 40 MG capsule Take 1 capsule (40 mg total) by mouth daily. Patient not taking: Reported on 05/21/2019 03/28/19 06/26/19  Enzo Bi, MD  furosemide (LASIX) 40 MG tablet Take 2 tablets (80 mg total) by mouth daily. Patient not taking: Reported on 05/21/2019 05/03/19 08/01/19  Charlynne Cousins, MD  loratadine (CLARITIN) 10 MG tablet Take 10 mg by mouth daily.    [provider]  magnesium citrate SOLN Take 1 Bottle by mouth as directed.    [provider]  nitroGLYCERIN (NITROSTAT) 0.4 MG SL tablet Place 1 tablet (0.4 mg total) under the tongue every 5 (five) minutes as needed for chest pain. Patient not taking: Reported on 05/21/2019 05/03/19   Charlynne Cousins, MD  pantoprazole (PROTONIX) 40 MG tablet Take 1 tablet (40 mg total) by mouth daily. Patient not taking: Reported on 05/21/2019 05/03/19   Charlynne Cousins, MD  polyethylene glycol (MIRALAX) 17 g packet Take 17 g by mouth daily. Patient not taking: Reported on 05/21/2019 02/21/19   Nance Pear, MD  rivaroxaban (XARELTO) 20 MG TABS tablet Take 1 tablet (20 mg total) by mouth daily with supper. For the blood clot in your heart. Patient not taking: Reported on 05/21/2019 05/03/19 08/01/19  Charlynne Cousins, MD  sacubitril-valsartan (ENTRESTO) 24-26 MG Take 1 tablet by mouth 2 (two) times daily. Patient not taking: Reported on 05/21/2019 05/03/19 08/01/19  Charlynne Cousins, MD  zinc sulfate 220 (50 Zn) MG capsule Take 1 capsule (220 mg total) by mouth daily. Patient not taking: Reported on 05/21/2019 03/10/19   Max Sane, MD     Allergies Penicillins and Ace inhibitors  Family History  Problem Relation Age of Onset  . Breast cancer Neg Hx     Social History Social History   Tobacco Use  . Smoking status: Current Every Day Smoker  . Smokeless tobacco: Never Used  Substance Use Topics  . Alcohol use: No    Alcohol/week: 0.0 standard drinks  . Drug use: Yes    Types: Cocaine    Comment: 4-5 days ago    Review of Systems limited secondary to somnolence Constitutional: No fever Cardiovascular: Denies chest pain. Respiratory: Positive for shortness of breath. Positive for cough. Gastrointestinal: No abdominal pain.   Skin: Negative for rash. Neurological: Negative for headaches, focal weakness or numbness.  ____________________________________________   PHYSICAL EXAM:  VITAL SIGNS: ED Triage Vitals  Enc Vitals Group     BP 05/21/19 1434 139/88     Pulse Rate 05/21/19 1434 88     Resp 05/21/19 1434 (!) 26     Temp 05/21/19 1434 98 F (36.7 C)     Temp Source 05/21/19 1434 Oral     SpO2  05/21/19 1434 94 %     Weight 05/21/19 1435 216 lb 0.8 oz (98 kg)     Height 05/21/19 1435 '5\' 8"'$  (1.727 m)     Head Circumference --      Peak Flow --      Pain Score 05/21/19 1434 8   Constitutional: Somnolent. Awakens to verbal stimuli.  Eyes: Conjunctivae are normal.  ENT      Head: Normocephalic and atraumatic.      Nose: No congestion/rhinnorhea.      Mouth/Throat: Mucous membranes are moist.      Neck: No stridor. Hematological/Lymphatic/Immunilogical: No cervical lymphadenopathy. Cardiovascular: Normal rate, regular rhythm.  No murmurs, rubs, or gallops.  Respiratory: Increased respiratory effort.  Gastrointestinal: Soft and non tender. No rebound. No guarding.  Genitourinary: Deferred Musculoskeletal: Normal range of motion in all extremities.  Neurologic:  Somnolent, awakens to verbal stimuli. Moving all extremities.  Skin:  Skin is warm, dry and intact. No rash  noted. ____________________________________________    LABS (pertinent positives/negatives)  Trop hs 123 > 126 CMP na 142, k 4.0, glu 154, cr 1.93, alk phos 223, t bili 1.4 CBC wbc 6.5, hgb 10.5, plt 291 BNP > 4,500 ____________________________________________   EKG  I, Nance Pear, attending physician, personally viewed and interpreted this EKG  EKG Time: 1438 Rate: 94 Rhythm: sinus rhythm with pac Axis: left axis deviation Intervals: qtc 502 QRS: Incomplete RBBB ST changes: no st elevation Impression: abnormal ekg ____________________________________________    RADIOLOGY  CXR Cardiac enlargement with vascular congestion. Bibasilar atelectasis/infiltrate  ____________________________________________   PROCEDURES  Procedures  ____________________________________________   INITIAL IMPRESSION / ASSESSMENT AND PLAN / ED COURSE  Pertinent labs & imaging results that were available during my care of the patient were reviewed by me and considered in my medical decision making (see chart for details).   Patient presented to the emergency department today from heart failure clinic.  On exam patient is quite somnolent.  She would wake up to verbal stimuli however fall shortly back to sleep after getting out of few words.  Patient does have a history of heart failure.  Troponin was elevated although does not appear grossly different from her recent troponin.  BNP was found to be greater than 4500.  She was given Lasix however no significant clinical change.  Will start patient on BiPAP see if that helps her breathing and mentation.  Her CO2 however was normal.  Will plan on admission.  ____________________________________________   FINAL CLINICAL IMPRESSION(S) / ED DIAGNOSES  Final diagnoses:  Somnolence  Shortness of breath  Congestive heart failure, unspecified HF chronicity, unspecified heart failure type [facility])     Note: This dictation was prepared with  Dragon dictation. Any transcriptional errors that result from this process are unintentional     Nance Pear, MD 05/21/19 2043

## 2019-05-21 NOTE — Telephone Encounter (Signed)
Attempted to contact, no answer and unable to leave message due to mailbox not set up.    [place] [phone]

## 2019-05-21 NOTE — Telephone Encounter (Signed)
Was able to get intouch with Katelyn Lane after second try today.  She states she feels bad and has fluid.  She states she was on the way to ED.  Asked if I could get her in at the HF clinic would she go there instead of ED, she advised yes.  Contacted HF clinic and they advised could work her in now.  [name] advised she will go there.  Explained the HF clinc and directions to her.  She appeared to understand.  She is very alert and advised her I would be calling her on Monday to set up a visit to meet her and explain the [employer].  She advised she did not have a home and she will explain it all to me later.  After about 15 minutes contacted [facility] and asked if she was on the way and she appears lethargic and advised she is on the way and she was driving.  She sounds different and I asked if she needed to pull over and call 911.  She states she is ok and can make it there.  She advised felt like she may pass out.  Had to get her to repeat her self several times and never could make out some things she was saying.  She did show for her Heart Failure appt.  Will contact next week for Heart Failure.   [place] [phone]

## 2019-05-21 NOTE — ED Notes (Signed)
RT at bedside.

## 2019-05-21 NOTE — ED Notes (Addendum)
Pt states that she's hear due to access fluid. Pt states she hurts all over, keeps drifting off to sleep while assessing.   Dr. Archie Balboa at bedside. Pt short of breath with and without exertion. Pt has poor concentration.   Pt denies fevers, n/v.

## 2019-05-21 NOTE — Consult Note (Addendum)
ANTICOAGULATION CONSULT NOTE - Initial Consult  Pharmacy Consult for Heparin infusion Indication: chest pain/ACS  Allergies  Allergen Reactions  . Penicillins Hives, Rash and Other (See Comments)    Did it involve swelling of the face/tongue/throat, SOB, or low BP? No Did it involve sudden or severe rash/hives, skin peeling, or any reaction on the inside of your mouth or nose? Yes Did you need to seek medical attention at a hospital or doctor's office? Yes When did it last happen? >25 years If all above answers are "NO", may proceed with cephalosporin use.   . Ace Inhibitors Nausea And Vomiting    Patient Measurements: Height: 5\' 8"  (172.7 cm) Weight: 98 kg (216 lb 0.8 oz) IBW/kg (Calculated) : 63.9 Heparin Dosing Weight: 85.3 kg  Vital Signs: Temp: 98 F (36.7 C) (05/13 1434) Temp Source: Oral (05/13 1434) BP: 126/107 (05/13 2030) Pulse Rate: 99 (05/13 2030)  Labs: Recent Labs    05/20/19 1802 05/21/19 1437 05/21/19 1645  HGB 10.4* 10.5*  --   HCT 35.0* 35.6*  --   PLT 383 291  --   CREATININE 2.30* 1.93*  --   TROPONINIHS  --  123* 126*    Estimated Creatinine Clearance: 36.5 mL/min (A) (by C-G formula based on SCr of 1.93 mg/dL (H)).   Medical History: Past Medical History:  Diagnosis Date  . CHF (congestive heart failure) ([place])   . COVID-19   . Hyperlipidemia   . Hypertension   . Renal disorder     Medications:  Rivaroxaban 20 mg daily (patient reported last dose was 5/12 evening) for LV thrombus discovered on 01/19/2019 ECHO  There are concerns about non-compliance with medications  Assessment: Patient is a 64 y/o F with past medical history significant for CHF, LV mural thrombus, CKD stage IIIb, stroke who was sent to ED from CHF clinic for CHF exacerbation. Troponins 126 which appear slightly elevated over baseline. EKG preliminary result indicative of lateral ischemia. Pharmacy has been consulted to initiate heparin infusion.  Baseline CBC  with Hgb of 10.5 (c/w baseline), platelets within normal limits. Baseline aPTT within normal limits, baseline heparin level elevated to 0.58, baseline INR elevated to 1.4.  Goal of Therapy:  aPTT 66-102 seconds Monitor platelets by anticoagulation protocol: Yes   Plan:  -Initiate heparin infusion at 1000 units/hr, no bolus -aPTT in 8 hours. Will follow aPTT due to interference of rivaroxaban with anti Xa level. Check heparin level with aPTT for correlation -Daily CBC per protocol  [place] Resident 05/21/2019,9:14 PM

## 2019-05-21 NOTE — ED Triage Notes (Signed)
Pt poor historian. Pt reports "fluid" when asking her what is wrong today. Pt has poor concentration and drifts off to sleep when not being interacted with. Reports recent falls. Alert and oriented X 4 when awake.

## 2019-05-21 NOTE — H&P (Signed)
History and Physical    Katelyn Lane QIH:[id number] DOB: 05-02-1955 DOA: 05/21/2019  PCP: [name], MD   Patient coming from: home  I have personally briefly reviewed patient's old medical records in [place]  Chief Complaint: Shortness of breath, drowsiness  HPI: Katelyn Lane is a 64 y.o. 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  Patient was noted to be very somnolent and drowsy and was referred to the emergency room for evaluation.  Patient was evaluated just a few days prior in the ER, diagnosed with mild heart failure exacerbation and she was diuresed on discharge.  At the present time she complains of bilateral lower extremity and feeling like her fluid is building up.  Also has shortness of breath with exertion and orthopnea.  She denies chest pain.  Denies fever and cough  ED Course: Work-up in the ER significant for BNP over 4500, troponin 123>> 126, which is up from her chronically elevated troponins in the 50s and 60s.  EKG showed no acute ST-T wave changes.  Creatinine 1.93 which is about her baseline.  Chest x-ray showing enlargement of cardiac silhouette with pulmonary vascular congestion and minimal chronic failure, unchanged from prior the day before.  Also shows bibasilar atelectasis/infiltrate left greater than right unchanged.  Patient was given a dose of Lasix hospitalist consulted for admission.  UDS pending at the time of admission  Review of  Systems: As per HPI otherwise 10 point review of systems negative.  History may be unreliable patient was falling asleep while questions were asked   Past Medical History:  Diagnosis Date  . CHF (congestive heart failure) ([place])   . COVID-19   . Hyperlipidemia   . Hypertension   . Renal disorder     Past Surgical History:  Procedure Laterality Date  . RIGHT/LEFT HEART CATH AND CORONARY ANGIOGRAPHY N/A 12/30/2018   Procedure: RIGHT/LEFT HEART CATH AND CORONARY ANGIOGRAPHY;  Surgeon: Corey Skains, MD;  Location: [facility];  Service: Cardiovascular;  Laterality: N/A;     reports that she has been smoking. She has never used smokeless tobacco. She reports current drug use. Drug: Cocaine. She reports that she does not drink alcohol.  Allergies  Allergen Reactions  . Penicillins Hives, Rash and Other (See Comments)    Did it involve swelling of the face/tongue/throat, SOB, or low BP? No Did it involve sudden or severe rash/hives, skin peeling, or any reaction on the inside of your mouth or nose? Yes Did you need to seek medical attention at a hospital or doctor's office? Yes When did it last happen? >25 years If all above answers are "NO", may proceed with cephalosporin use.   . Ace Inhibitors Nausea And Vomiting    Family History  Problem Relation Age of Onset  . Breast cancer Neg Hx      Prior to Admission medications   Medication Sig Start Date End Date Taking? Authorizing Provider  albuterol (VENTOLIN HFA) 108 (90 Base) MCG/ACT inhaler Inhale 2 puffs into the lungs every 6 (six) hours as needed for wheezing or  shortness of breath. Patient not taking: Reported on 05/21/2019 03/28/19   Enzo Bi, MD  ascorbic acid (VITAMIN C) 500 MG tablet Take 500 mg by mouth daily.    [provider]  aspirin 81 MG chewable tablet Chew 1 tablet (81 mg total) by mouth daily. Patient not taking: Reported on 05/21/2019 05/03/19   Charlynne Cousins, MD  atorvastatin  (LIPITOR) 20 MG tablet Take 1 tablet (20 mg total) by mouth daily at 6 PM. Patient not taking: Reported on 05/21/2019 05/03/19 08/01/19  Charlynne Cousins, MD  carvedilol (COREG) 3.125 MG tablet Take 1 tablet (3.125 mg total) by mouth 2 (two) times daily with a meal. Patient not taking: Reported on 05/21/2019 05/03/19 08/01/19  Charlynne Cousins, MD  dicyclomine (BENTYL) 20 MG tablet Take 20 mg by mouth 3 (three) times daily as needed.  02/22/19   [provider]  docusate calcium (SURFAK) 240 MG capsule Take 240 mg by mouth daily as needed for moderate constipation.     [provider]  FLUoxetine (PROZAC) 20 MG capsule Take 1 capsule (20 mg total) by mouth daily. Patient not taking: Reported on 05/21/2019 05/03/19   Charlynne Cousins, MD  FLUoxetine (PROZAC) 40 MG capsule Take 1 capsule (40 mg total) by mouth daily. Patient not taking: Reported on 05/21/2019 03/28/19 06/26/19  Enzo Bi, MD  furosemide (LASIX) 40 MG tablet Take 2 tablets (80 mg total) by mouth daily. Patient not taking: Reported on 05/21/2019 05/03/19 08/01/19  Charlynne Cousins, MD  loratadine (CLARITIN) 10 MG tablet Take 10 mg by mouth daily.    [provider]  magnesium citrate SOLN Take 1 Bottle by mouth as directed.    [provider]  nitroGLYCERIN (NITROSTAT) 0.4 MG SL tablet Place 1 tablet (0.4 mg total) under the tongue every 5 (five) minutes as needed for chest pain. Patient not taking: Reported on 05/21/2019 05/03/19   Charlynne Cousins, MD  pantoprazole (PROTONIX) 40 MG tablet Take 1 tablet (40 mg total) by mouth daily. Patient not taking: Reported on 05/21/2019 05/03/19   Charlynne Cousins, MD  polyethylene glycol (MIRALAX) 17 g packet Take 17 g by mouth daily. Patient not taking: Reported on 05/21/2019 02/21/19   Nance Pear, MD  rivaroxaban (XARELTO) 20 MG TABS tablet Take 1 tablet (20 mg total) by mouth daily with supper. For the blood clot in your heart. Patient not  taking: Reported on 05/21/2019 05/03/19 08/01/19  Charlynne Cousins, MD  sacubitril-valsartan (ENTRESTO) 24-26 MG Take 1 tablet by mouth 2 (two) times daily. Patient not taking: Reported on 05/21/2019 05/03/19 08/01/19  Charlynne Cousins, MD  zinc sulfate 220 (50 Zn) MG capsule Take 1 capsule (220 mg total) by mouth daily. Patient not taking: Reported on 05/21/2019 03/10/19   Max Sane, MD    Physical Exam: Vitals:   05/21/19 1900 05/21/19 1930 05/21/19 2000 05/21/19 2030  BP: (!) 150/99 (!) 134/93 (!) 142/102 (!) 126/107  Pulse: (!) 104 100 (!) 46 99  Resp: 15 (!) 32 (!) 26 17  Temp:      TempSrc:      SpO2: 93% 98% 91% 99%  Weight:      Height:         Vitals:   05/21/19 1900 05/21/19 1930 05/21/19 2000 05/21/19 2030  BP: (!) 150/99 (!) 134/93 (!) 142/102 (!) 126/107  Pulse: (!) 104 100 (!) 46 99  Resp: 15 (!) 32 (!) 26 17  Temp:  TempSrc:      SpO2: 93% 98% 91% 99%  Weight:      Height:        Constitutional: Very somnolent will answer questions but will rapidly fall asleep, though very easily arousable to name being called Eyes: PERLA, EOMI, irises appear normal, anicteric sclera,  ENMT: external ears and nose appear normal, normal hearing             Lips appears normal, BiPAP on  neck: neck appears normal, no masses, normal ROM, no thyromegaly, no JVD  CVS: S1-S2 clear, no murmur rubs or gallops,  , no carotid bruits, pedal pulses palpable, 3+ pitting edema Respiratory: Diminished bilaterally, few rales respiratory effort slightly increased. No accessory muscle use.  Abdomen: soft nontender, nondistended, normal bowel sounds, no hepatosplenomegaly, no hernias Musculoskeletal: : no cyanosis, clubbing , no contractures or atrophy Neuro: Cranial nerves II-XII intact, sensation, reflexes normal, strength Psych: Difficult to assess due to somnolence Skin: no rashes or lesions or ulcers, no induration or nodules   Labs on Admission: I have personally reviewed  following labs and imaging studies  CBC: Recent Labs  Lab 05/20/19 1802 05/21/19 1437  WBC 6.6 6.5  HGB 10.4* 10.5*  HCT 35.0* 35.6*  MCV 75.9* 76.6*  PLT 383 790   Basic Metabolic Panel: Recent Labs  Lab 05/20/19 1802 05/21/19 1437  NA 146* 142  K 4.1 4.0  CL 108 106  CO2 29 26  GLUCOSE 124* 154*  BUN 54* 46*  CREATININE 2.30* 1.93*  CALCIUM 8.7* 9.0   GFR: Estimated Creatinine Clearance: 36.5 mL/min (A) (by C-G formula based on SCr of 1.93 mg/dL (H)). Liver Function Tests: Recent Labs  Lab 05/21/19 1437  AST 44*  ALT 33  ALKPHOS 223*  BILITOT 1.4*  PROT 6.7  ALBUMIN 3.2*   No results for input(s): LIPASE, AMYLASE in the last 168 hours. No results for input(s): AMMONIA in the last 168 hours. Coagulation Profile: No results for input(s): INR, PROTIME in the last 168 hours. Cardiac Enzymes: No results for input(s): CKTOTAL, CKMB, CKMBINDEX, TROPONINI in the last 168 hours. BNP (last 3 results) No results for input(s): PROBNP in the last 8760 hours. HbA1C: No results for input(s): HGBA1C in the last 72 hours. CBG: Recent Labs  Lab 05/21/19 1446  GLUCAP 148*   Lipid Profile: No results for input(s): CHOL, HDL, LDLCALC, TRIG, CHOLHDL, LDLDIRECT in the last 72 hours. Thyroid Function Tests: No results for input(s): TSH, T4TOTAL, FREET4, T3FREE, THYROIDAB in the last 72 hours. Anemia Panel: No results for input(s): VITAMINB12, FOLATE, FERRITIN, TIBC, IRON, RETICCTPCT in the last 72 hours. Urine analysis:    Component Value Date/Time   COLORURINE YELLOW (A) 03/24/2019 2009   APPEARANCEUR CLEAR (A) 03/24/2019 2009   LABSPEC 1.012 03/24/2019 2009   PHURINE 6.0 03/24/2019 2009   GLUCOSEU NEGATIVE 03/24/2019 2009   HGBUR NEGATIVE 03/24/2019 2009   BILIRUBINUR NEGATIVE 03/24/2019 2009   [place] NEGATIVE 03/24/2019 2009   PROTEINUR 100 (A) 03/24/2019 2009   NITRITE NEGATIVE 03/24/2019 2009   LEUKOCYTESUR NEGATIVE 03/24/2019 2009    Radiological Exams  on Admission: DG Chest 2 View  Result Date: 05/21/2019 CLINICAL DATA:  Short of breath EXAM: CHEST - 2 VIEW COMPARISON:  05/20/2019 FINDINGS: Marked cardiac enlargement unchanged. Pulmonary vascular congestion unchanged. Bibasilar airspace disease left greater than right unchanged. No significant pleural effusion. IMPRESSION: Cardiac enlargement with vascular congestion unchanged. Bibasilar atelectasis/infiltrate left greater than right is unchanged. Electronically Signed   By: Juanda Crumble  Carlis Abbott M.D.   On: 05/21/2019 15:31   DG Chest 2 View  Result Date: 05/20/2019 CLINICAL DATA:  Shortness of breath, history CHF, dilated cardiomyopathy, COVID-19, hypertension EXAM: CHEST - 2 VIEW COMPARISON:  Portable exam of 04/29/2019 FINDINGS: Enlargement of cardiac silhouette with pulmonary vascular congestion. Mediastinal contours normal. Minimal interstitial prominence diffusely throughout both lungs suspect representing mild chronic failure. No definite acute infiltrate, pleural effusion or pneumothorax. IMPRESSION: Enlargement of cardiac silhouette with pulmonary vascular congestion and minimal chronic failure. No acute abnormalities. Electronically Signed   By: Lavonia Dana M.D.   On: 05/20/2019 19:50    EKG: Independently reviewed.   Assessment/Plan Principal Problem:   Acute on chronic combined systolic and diastolic CHF (congestive heart failure) (HCC) Active Problems:   Acute metabolic encephalopathy   Dilated cardiomyopathy (HCC)   Elevated troponin   LV (left ventricular) mural thrombus   CKD (chronic kidney disease), stage IIIb   History of substance abuse (HCC)   CHF (congestive heart failure) (HCC)  Acute on chronic combined systolic and dilated and diastolic heart failure: -Suspect related to poor compliance and poor follow-up -Recently hospitalized at the end of April 2021 with heart failure exacerbation and was a no-show at outpatient follow-up appointment on 05/07/2019 -IV Lasix -Continue  Coreg and Entresto once med rec done -Daily weights with intake and output monitoring -Cardiology consult  Elevated troponins: -Likely secondary to demand ischemia with troponin of 123 remaining flat at 126.  Troponin chronically elevated in the 50s and 60s -Had cardiac cath on 12/30/2018 that showed 30% stenosis in the left circumflex and 10% stenosis in mid RCA -Low suspicion for ACS in the absence of chest pain, nonacute EKG and recent cath in December 2020 that showed mainly nonobstructive disease -Heparin infusion given elevated troponin above baseline pending further cardiology recommendations -Continue beta-blockers.  Not currently on antiplatelets likely due to chronic anticoagulation.  Continue statin -Cardiology consult  Altered mental status/acute metabolic encephalopathy -Uncertain etiology. -ABG in the ER showed pH 7.5, PCO2 32 and PO2 87 so low suspicion for CO2 narcosis -In the setting of history of substance abuse UDS ordered -Follow-up UDS  Depression: Continue current home regimen   History of CVA: Continue present statin.  History LV thrombus: -Suspect noncompliance so will heparinize pending -Heparin infusion -Patient will need renal adjustment of Xarelto going forward   chronic kidney disease stage IIIb: -With a baseline creatinine running 1.7-2.0  Polysubstance abuse: -Pending UDS.  DVT prophylaxis: On full dose heparin Code Status: full code  Family Communication:  none  Disposition Plan: Back to previous home environment Consults called: Dr Clayborn Bigness Status:obs    Athena Masse MD [facility]     05/21/2019, 8:57 PM

## 2019-05-21 NOTE — Patient Instructions (Signed)
Continue weighing daily and call for an overnight weight gain of > 2 pounds or a weekly weight gain of >5 pounds. 

## 2019-05-21 NOTE — Progress Notes (Signed)
Patient ID: Katelyn Lane, female    DOB: 07/19/1955, 64 y.o.   MRN: [id number]  HPI  [name] is a 64 y/o female with a history of HTN, CKD, hyperlipidemia, current tobacco/cocaine use and chronic heart failure.   Echo report from 04/29/19 reviewed and showed an EF of <20% along with moderate MR and severe TR.   Catheterization done 12/30/2018 and showed:  Mid RCA lesion is 10% stenosed.  Prox Cx lesion is 30% stenosed.  Hemodynamic findings consistent with moderate pulmonary hypertension.   LV angiogram showing severe dilated cardiomyopathy with ejection fraction of 15% Coronary angiogram showing minimal atherosclerosis of left circumflex and right coronary Significant elevation of left end-diastolic pressures, pulmonary pressures, pulmonary capillary wedge pressures  Was in the ED 05/20/19 due to acute on chronic HF. Given IV lasix and she was released. Numerous ED visits and admissions since January 2021.   She presents today for her initial visit with a chief complaint of moderate shortness of breath with little to no exertion. She describes this as chronic in nature having been present for several years with varying levels of severity. She has associated fatigue, chest tightness, cough, chest pain, pedal edema, anxiety and difficulty sleeping along with this. She denies any dizziness or abdominal distention.   She is not a very good historian but says that she hasn't taken any of her medications since yesterday morning because when she went home, she was cold because she didn't have any heat. She says that she called the sheriff's department last night because she didn't want to stay at her place by herself. She says that she last did cocaine 3 days ago but patient is very sleepy in the office with slurring of words at times. Patient has to be aroused awake numerous times during exam.   Past Medical History:  Diagnosis Date  . CHF (congestive heart failure) ([place])   . COVID-19   .  Hyperlipidemia   . Renal disorder    Past Surgical History:  Procedure Laterality Date  . RIGHT/LEFT HEART CATH AND CORONARY ANGIOGRAPHY N/A 12/30/2018   Procedure: RIGHT/LEFT HEART CATH AND CORONARY ANGIOGRAPHY;  Surgeon: Corey Skains, MD;  Location: [facility];  Service: Cardiovascular;  Laterality: N/A;   Family History  Problem Relation Age of Onset  . Breast cancer Neg Hx    Social History   Tobacco Use  . Smoking status: Current Every Day Smoker  . Smokeless tobacco: Never Used  Substance Use Topics  . Alcohol use: No    Alcohol/week: 0.0 standard drinks   Allergies  Allergen Reactions  . Penicillins Hives, Rash and Other (See Comments)    Did it involve swelling of the face/tongue/throat, SOB, or low BP? No Did it involve sudden or severe rash/hives, skin peeling, or any reaction on the inside of your mouth or nose? Yes Did you need to seek medical attention at a hospital or doctor's office? Yes When did it last happen? >25 years If all above answers are "NO", may proceed with cephalosporin use.   . Ace Inhibitors Nausea And Vomiting   Prior to Admission medications   Medication Sig Start Date End Date Taking? Authorizing Provider  albuterol (VENTOLIN HFA) 108 (90 Base) MCG/ACT inhaler Inhale 2 puffs into the lungs every 6 (six) hours as needed for wheezing or shortness of breath. 03/28/19  Yes Enzo Bi, MD  ascorbic acid (VITAMIN C) 500 MG tablet Take 500 mg by mouth daily.   Yes [provider]  aspirin 81 MG chewable tablet Chew 1 tablet (81 mg total) by mouth daily. 05/03/19  Yes Charlynne Cousins, MD  atorvastatin (LIPITOR) 20 MG tablet Take 1 tablet (20 mg total) by mouth daily at 6 PM. 05/03/19 08/01/19 Yes Charlynne Cousins, MD  carvedilol (COREG) 3.125 MG tablet Take 1 tablet (3.125 mg total) by mouth 2 (two) times daily with a meal. 05/03/19 08/01/19 Yes Charlynne Cousins, MD  dicyclomine (BENTYL) 20 MG tablet Take 20 mg by  mouth 3 (three) times daily as needed.  02/22/19  Yes [provider]  docusate calcium (SURFAK) 240 MG capsule Take 240 mg by mouth daily as needed for moderate constipation.    Yes [provider]  FLUoxetine (PROZAC) 20 MG capsule Take 1 capsule (20 mg total) by mouth daily. 05/03/19  Yes Charlynne Cousins, MD  FLUoxetine (PROZAC) 40 MG capsule Take 1 capsule (40 mg total) by mouth daily. 03/28/19 06/26/19 Yes Enzo Bi, MD  furosemide (LASIX) 40 MG tablet Take 2 tablets (80 mg total) by mouth daily. 05/03/19 08/01/19 Yes Charlynne Cousins, MD  loratadine (CLARITIN) 10 MG tablet Take 10 mg by mouth daily.   Yes [provider]  magnesium citrate SOLN Take 1 Bottle by mouth as directed.   Yes [provider]  nitroGLYCERIN (NITROSTAT) 0.4 MG SL tablet Place 1 tablet (0.4 mg total) under the tongue every 5 (five) minutes as needed for chest pain. 05/03/19  Yes Charlynne Cousins, MD  pantoprazole (PROTONIX) 40 MG tablet Take 1 tablet (40 mg total) by mouth daily. 05/03/19  Yes Charlynne Cousins, MD  polyethylene glycol (MIRALAX) 17 g packet Take 17 g by mouth daily. 02/21/19  Yes Nance Pear, MD  rivaroxaban (XARELTO) 20 MG TABS tablet Take 1 tablet (20 mg total) by mouth daily with supper. For the blood clot in your heart. 05/03/19 08/01/19 Yes Charlynne Cousins, MD  sacubitril-valsartan (ENTRESTO) 24-26 MG Take 1 tablet by mouth 2 (two) times daily. 05/03/19 08/01/19 Yes Charlynne Cousins, MD  zinc sulfate 220 (50 Zn) MG capsule Take 1 capsule (220 mg total) by mouth daily. 03/10/19  Yes Max Sane, MD    Review of Systems  Constitutional: Positive for fatigue. Negative for appetite change.  HENT: Negative for congestion, postnasal drip and sore throat.   Eyes: Negative.   Respiratory: Positive for cough, chest tightness and shortness of breath.   Cardiovascular: Positive for chest pain and leg swelling. Negative for palpitations.  Gastrointestinal:  Negative for abdominal distention and abdominal pain.  Endocrine: Negative.   Genitourinary: Negative.   Musculoskeletal: Positive for arthralgias (left leg). Negative for back pain.  Skin: Negative.   Allergic/Immunologic: Negative.   Neurological: Negative for dizziness and light-headedness.  Hematological: Negative for adenopathy. Does not bruise/bleed easily.  Psychiatric/Behavioral: Positive for sleep disturbance (not sleeping well due to pain). Negative for dysphoric mood. The patient is nervous/anxious.    Vitals:   05/21/19 1406  BP: (!) 152/139  Pulse: 91  Resp: (!) 24  SpO2: 100%  Weight: 218 lb (98.9 kg)  Height: 5\' 8"  (1.727 m)   Wt Readings from Last 3 Encounters:  05/21/19 218 lb (98.9 kg)  05/20/19 218 lb (98.9 kg)  05/03/19 217 lb 1.6 oz (98.5 kg)   Lab Results  Component Value Date   CREATININE 2.30 (H) 05/20/2019   CREATININE 1.96 (H) 05/03/2019   CREATININE 1.85 (H) 05/02/2019     Physical Exam Vitals and nursing  note reviewed.  Constitutional:      Appearance: Normal appearance. She is well-developed.  HENT:     Head: Normocephalic and atraumatic.  Cardiovascular:     Rate and Rhythm: Regular rhythm. Tachycardia present.  Pulmonary:     Effort: Tachypnea present.     Breath sounds: Examination of the right-lower field reveals rhonchi. Examination of the left-lower field reveals rhonchi. Rhonchi present. No wheezing or rales.     Comments: Lungs do clear some after coughing Abdominal:     Palpations: Abdomen is soft.     Tenderness: There is no abdominal tenderness.  Musculoskeletal:     Cervical back: Normal range of motion and neck supple.     Right lower leg: Tenderness present. Edema (2+ pitting) present.     Left lower leg: Tenderness present. Edema (2+ pitting) present.  Skin:    General: Skin is warm and dry.  Neurological:     Mental Status: She is lethargic.     Comments: Slurring of words Falling asleep while talking  Psychiatric:         Mood and Affect: Mood is anxious.        Behavior: Behavior is agitated.      Assessment & Plan:  1: Chronic heart failure with reduced ejection fraction- - NYHA class IV - moderately fluid overloaded with pedal edema, tachypnea and rhonchi - unclear if weighing although she says that she weighed 218 lbs this morning; patient too lethargic to stand - discussed doing IV lasix but concerned about safety of doing this and then sending her home - paramedic has made contact with patient - NT escorted patient to the ED and the triage nurse was called - BNP on 04/29/19 was >4500.0  2: HTN- - BP elevated but she says that she hasn't taken any of her medications since yesterday morning - will need to adjust medications at future visits - BMP 05/20/19 reviewed and showed sodium 146, potassium 4.1, creatinine 2.3 and GFR 25  3: Tobacco use- - smoking 1/2 ppd of cigarettes - + cocaine use and she says that she last used cocaine 3 days ago - unclear if she's drank any alcohol   Will have patient return next week, sooner if needed.

## 2019-05-22 ENCOUNTER — Encounter: Payer: Self-pay | Admitting: Internal Medicine

## 2019-05-22 ENCOUNTER — Other Ambulatory Visit: Payer: Self-pay

## 2019-05-22 DIAGNOSIS — I5023 Acute on chronic systolic (congestive) heart failure: Secondary | ICD-10-CM

## 2019-05-22 DIAGNOSIS — J441 Chronic obstructive pulmonary disease with (acute) exacerbation: Secondary | ICD-10-CM

## 2019-05-22 DIAGNOSIS — I272 Pulmonary hypertension, unspecified: Secondary | ICD-10-CM

## 2019-05-22 DIAGNOSIS — J449 Chronic obstructive pulmonary disease, unspecified: Secondary | ICD-10-CM

## 2019-05-22 DIAGNOSIS — F1490 Cocaine use, unspecified, uncomplicated: Secondary | ICD-10-CM

## 2019-05-22 DIAGNOSIS — E785 Hyperlipidemia, unspecified: Secondary | ICD-10-CM

## 2019-05-22 DIAGNOSIS — N1832 Chronic kidney disease, stage 3b: Secondary | ICD-10-CM

## 2019-05-22 LAB — CBC
HCT: 33.8 % — ABNORMAL LOW (ref 36.0–46.0)
Hemoglobin: 10 g/dL — ABNORMAL LOW (ref 12.0–15.0)
MCH: 22.4 pg — ABNORMAL LOW (ref 26.0–34.0)
MCHC: 29.6 g/dL — ABNORMAL LOW (ref 30.0–36.0)
MCV: 75.6 fL — ABNORMAL LOW (ref 80.0–100.0)
Platelets: 328 10*3/uL (ref 150–400)
RBC: 4.47 MIL/uL (ref 3.87–5.11)
RDW: 20 % — ABNORMAL HIGH (ref 11.5–15.5)
WBC: 6.6 10*3/uL (ref 4.0–10.5)
nRBC: 0 % (ref 0.0–0.2)

## 2019-05-22 LAB — BASIC METABOLIC PANEL
Anion gap: 9 (ref 5–15)
BUN: 47 mg/dL — ABNORMAL HIGH (ref 8–23)
CO2: 28 mmol/L (ref 22–32)
Calcium: 8.7 mg/dL — ABNORMAL LOW (ref 8.9–10.3)
Chloride: 105 mmol/L (ref 98–111)
Creatinine, Ser: 2 mg/dL — ABNORMAL HIGH (ref 0.44–1.00)
GFR calc Af Amer: 30 mL/min — ABNORMAL LOW (ref >60)
GFR calc non Af Amer: 26 mL/min — ABNORMAL LOW (ref >60)
Glucose, Bld: 120 mg/dL — ABNORMAL HIGH (ref 70–99)
Potassium: 3.8 mmol/L (ref 3.5–5.1)
Sodium: 142 mmol/L (ref 135–145)

## 2019-05-22 LAB — LIPID PANEL
Cholesterol: 100 mg/dL (ref 0–200)
HDL: 46 mg/dL (ref >40)
LDL Cholesterol: 43 mg/dL (ref 0–99)
Total CHOL/HDL Ratio: 2.2 RATIO
Triglycerides: 56 mg/dL (ref <150)
VLDL: 11 mg/dL (ref 0–40)

## 2019-05-22 LAB — APTT
aPTT: 71 seconds — ABNORMAL HIGH (ref 24–36)
aPTT: 70 seconds — ABNORMAL HIGH (ref 24–36)

## 2019-05-22 LAB — HEPARIN LEVEL (UNFRACTIONATED): Heparin Unfractionated: 0.48 IU/mL (ref 0.30–0.70)

## 2019-05-22 MED ORDER — BUDESONIDE 0.5 MG/2ML IN SUSP
0.5000 mg | Freq: Two times a day (BID) | RESPIRATORY_TRACT | Status: DC
  Administered 2019-05-22 – 2019-05-26 (×7): 0.5 mg via RESPIRATORY_TRACT
  Filled 2019-05-22 (×9): qty 2

## 2019-05-22 MED ORDER — HALOPERIDOL LACTATE 5 MG/ML IJ SOLN
1.0000 mg | Freq: Four times a day (QID) | INTRAMUSCULAR | Status: DC | PRN
  Administered 2019-05-22 – 2019-05-24 (×4): 1 mg via INTRAVENOUS
  Filled 2019-05-22 (×5): qty 1

## 2019-05-22 MED ORDER — IPRATROPIUM-ALBUTEROL 0.5-2.5 (3) MG/3ML IN SOLN
3.0000 mL | Freq: Four times a day (QID) | RESPIRATORY_TRACT | Status: DC
  Administered 2019-05-22 – 2019-05-26 (×10): 3 mL via RESPIRATORY_TRACT
  Filled 2019-05-22 (×15): qty 3

## 2019-05-22 MED ORDER — METHYLPREDNISOLONE SODIUM SUCC 40 MG IJ SOLR
40.0000 mg | Freq: Two times a day (BID) | INTRAMUSCULAR | Status: DC
  Administered 2019-05-22 – 2019-05-26 (×9): 40 mg via INTRAVENOUS
  Filled 2019-05-22 (×9): qty 1

## 2019-05-22 MED ORDER — FUROSEMIDE 10 MG/ML IJ SOLN
40.0000 mg | Freq: Two times a day (BID) | INTRAMUSCULAR | Status: DC
  Administered 2019-05-22 – 2019-05-26 (×8): 40 mg via INTRAVENOUS
  Filled 2019-05-22 (×8): qty 4

## 2019-05-22 MED ORDER — AMLODIPINE BESYLATE 5 MG PO TABS
5.0000 mg | ORAL_TABLET | Freq: Every morning | ORAL | Status: DC
  Administered 2019-05-22 – 2019-05-26 (×5): 5 mg via ORAL
  Filled 2019-05-22 (×5): qty 1

## 2019-05-22 NOTE — ED Notes (Signed)
Pt up to toilet when RN entered room, unable to measure urine. Hat placed in toilet for next time. Pt weighed

## 2019-05-22 NOTE — Progress Notes (Signed)
Upon assessment, patient stated she is struggling to breathe, grabbing the side of the bed to sit up. Oxygen saturations at 100% on 3L via [place]. This RN called respiratory and patient placed back on bipap.

## 2019-05-22 NOTE — Consult Note (Signed)
ANTICOAGULATION CONSULT NOTE - Initial Consult  Pharmacy Consult for Heparin infusion Indication: chest pain/ACS  Allergies  Allergen Reactions  . Penicillins Hives, Rash and Other (See Comments)    Did it involve swelling of the face/tongue/throat, SOB, or low BP? No Did it involve sudden or severe rash/hives, skin peeling, or any reaction on the inside of your mouth or nose? Yes Did you need to seek medical attention at a hospital or doctor's office? Yes When did it last happen? >25 years If all above answers are "NO", may proceed with cephalosporin use.   . Ace Inhibitors Nausea And Vomiting    Patient Measurements: Height: 5\' 9"  (175.3 cm) Weight: 99.7 kg (219 lb 11.2 oz) IBW/kg (Calculated) : 66.2 Heparin Dosing Weight: 85.3 kg  Vital Signs: Temp: 98.3 F (36.8 C) (05/14 0849) BP: 127/114 (05/14 0849) Pulse Rate: 93 (05/14 0849)  Labs: Recent Labs    05/20/19 1802 05/20/19 1802 05/21/19 1437 05/21/19 1645 05/21/19 2116 05/22/19 0459 05/22/19 1138  HGB 10.4*   < > 10.5*  --   --  10.0*  --   HCT 35.0*  --  35.6*  --   --  33.8*  --   PLT 383  --  291  --   --  328  --   APTT  --   --   --   --  27 71* 70*  LABPROT  --   --   --   --  16.5*  --   --   INR  --   --   --   --  1.4*  --   --   HEPARINUNFRC  --   --   --   --  0.58 0.48  --   CREATININE 2.30*  --  1.93*  --   --  2.00*  --   TROPONINIHS  --   --  123* 126*  --   --   --    < > = values in this interval not displayed.    Estimated Creatinine Clearance: 36.2 mL/min (A) (by C-G formula based on SCr of 2 mg/dL (H)).   Medical History: Past Medical History:  Diagnosis Date  . CHF (congestive heart failure) ([place])   . COVID-19   . Hyperlipidemia   . Hypertension   . Renal disorder     Medications:  Rivaroxaban 20 mg daily (patient reported last dose was 5/12 evening) for LV thrombus discovered on 01/19/2019 ECHO  There are concerns about non-compliance with  medications  Assessment: Patient is a 64 y/o F with past medical history significant for CHF, LV mural thrombus, CKD stage IIIb, stroke who was sent to ED from CHF clinic for CHF exacerbation. Troponins 126 which appear slightly elevated over baseline. EKG preliminary result indicative of lateral ischemia. Pharmacy has been consulted to initiate heparin infusion.  Baseline CBC with Hgb of 10.5 (c/w baseline), platelets within normal limits. Baseline aPTT within normal limits, baseline heparin level elevated to 0.58, baseline INR elevated to 1.4.  5/14 0459 APTT 71  5/14 1138 aPTT 70 HL 0.48   Goal of Therapy:  aPTT 66-102 seconds Monitor platelets by anticoagulation protocol: Yes   Plan:  APTT is therapeutic x 2. Heparin levels seems close to correlating. Will continue current heparin infusion rate. Follow up with heparin level, aPTT, and CBC with AM labs.  CBC stable.    Oswald Hillock, PharmD, BCPS 05/22/2019,12:31 PM

## 2019-05-22 NOTE — ED Notes (Signed)
Pt back in bed on bipap, comfortable at this time. Pt denies further needs.

## 2019-05-22 NOTE — ED Notes (Signed)
Attempted to call report, RN will call back.

## 2019-05-22 NOTE — Consult Note (Signed)
CARDIOLOGY CONSULT NOTE               Patient ID: Katelyn Lane MRN: [id number] DOB/AGE: 64/01/1955 64 y.o.  Admit date: 05/21/2019 Referring Physician Dr Leslye Peer Primary Physician Dr Alene Mires, Elyse Jarvis Primary Cardiologist [facility] Reason for Consultation CHF  HPI: Patient has a history of congestive heart failure systolic and diastolic dysfunction dilated cardiomyopathy ejection fraction around 20 to 25%.  Patient's had a long history of significant symptoms patient was last seen back in April she was found to have a left ventricular thrombus placed on Xarelto patient with history of depression history of history of Covid and medical noncompliance and polysubstance abuse.  Patient complains of fluid buildup presented to heart failure clinic she felt somnolent weak drowsy referred to emergency room for evaluation diagnosed with mild congestive heart failure and then brought to the emergency room for evaluation denies any chest pain .  History of elevated BNP borderline troponins .  Patient with history of elevated creatinine followed by nephrology presents for evaluation  Review of systems complete and found to be negative unless listed above     Past Medical History:  Diagnosis Date  . CHF (congestive heart failure) ([place])   . COVID-19   . Hyperlipidemia   . Hypertension   . Renal disorder     Past Surgical History:  Procedure Laterality Date  . RIGHT/LEFT HEART CATH AND CORONARY ANGIOGRAPHY N/A 12/30/2018   Procedure: RIGHT/LEFT HEART CATH AND CORONARY ANGIOGRAPHY;  Surgeon: Corey Skains, MD;  Location: [place] [facility];  Service: Cardiovascular;  Laterality: N/A;    Medications Prior to Admission  Medication Sig Dispense Refill Last Dose  . albuterol (VENTOLIN HFA) 108 (90 Base) MCG/ACT inhaler Inhale 2 puffs into the lungs every 6 (six) hours as needed for wheezing or shortness of breath. (Patient not taking: Reported on 05/21/2019) 8 g 0   . ascorbic acid  (VITAMIN C) 500 MG tablet Take 500 mg by mouth daily.     Marland Kitchen aspirin 81 MG chewable tablet Chew 1 tablet (81 mg total) by mouth daily. (Patient not taking: Reported on 05/21/2019) 30 tablet 0   . atorvastatin (LIPITOR) 20 MG tablet Take 1 tablet (20 mg total) by mouth daily at 6 PM. (Patient not taking: Reported on 05/21/2019) 30 tablet 2   . carvedilol (COREG) 3.125 MG tablet Take 1 tablet (3.125 mg total) by mouth 2 (two) times daily with a meal. (Patient not taking: Reported on 05/21/2019) 60 tablet 2   . dicyclomine (BENTYL) 20 MG tablet Take 20 mg by mouth 3 (three) times daily as needed.      . docusate calcium (SURFAK) 240 MG capsule Take 240 mg by mouth daily as needed for moderate constipation.      Marland Kitchen FLUoxetine (PROZAC) 20 MG capsule Take 1 capsule (20 mg total) by mouth daily. (Patient not taking: Reported on 05/21/2019) 60 capsule 3   . FLUoxetine (PROZAC) 40 MG capsule Take 1 capsule (40 mg total) by mouth daily. (Patient not taking: Reported on 05/21/2019) 30 capsule 2   . furosemide (LASIX) 40 MG tablet Take 2 tablets (80 mg total) by mouth daily. (Patient not taking: Reported on 05/21/2019) 60 tablet 2   . loratadine (CLARITIN) 10 MG tablet Take 10 mg by mouth daily.     . magnesium citrate SOLN Take 1 Bottle by mouth as directed.     . nitroGLYCERIN (NITROSTAT) 0.4 MG SL tablet Place 1 tablet (0.4 mg total) under the  tongue every 5 (five) minutes as needed for chest pain. (Patient not taking: Reported on 05/21/2019) 30 tablet 1   . pantoprazole (PROTONIX) 40 MG tablet Take 1 tablet (40 mg total) by mouth daily. (Patient not taking: Reported on 05/21/2019) 30 tablet 0   . polyethylene glycol (MIRALAX) 17 g packet Take 17 g by mouth daily. (Patient not taking: Reported on 05/21/2019) 14 each 0   . rivaroxaban (XARELTO) 20 MG TABS tablet Take 1 tablet (20 mg total) by mouth daily with supper. For the blood clot in your heart. (Patient not taking: Reported on 05/21/2019) 30 tablet 2   .  sacubitril-valsartan (ENTRESTO) 24-26 MG Take 1 tablet by mouth 2 (two) times daily. (Patient not taking: Reported on 05/21/2019) 60 tablet 2   . zinc sulfate 220 (50 Zn) MG capsule Take 1 capsule (220 mg total) by mouth daily. (Patient not taking: Reported on 05/21/2019) 30 capsule 0    Social History   Socioeconomic History  . Marital status: Single    Spouse name: Not on file  . Number of children: Not on file  . Years of education: Not on file  . Highest education level: Not on file  Occupational History  . Not on file  Tobacco Use  . Smoking status: Current Every Day Smoker  . Smokeless tobacco: Never Used  Substance and Sexual Activity  . Alcohol use: No    Alcohol/week: 0.0 standard drinks  . Drug use: Yes    Types: Cocaine    Comment: 4-5 days ago  . Sexual activity: Not on file  Other Topics Concern  . Not on file  Social History Narrative  . Not on file   Social Determinants of Health   Financial Resource Strain:   . Difficulty of Paying Living Expenses:   Food Insecurity:   . Worried About Charity fundraiser in the Last Year:   . Arboriculturist in the Last Year:   Transportation Needs:   . Film/video editor (Medical):   Marland Kitchen Lack of Transportation (Non-Medical):   Physical Activity:   . Days of Exercise per Week:   . Minutes of Exercise per Session:   Stress:   . Feeling of Stress :   Social Connections:   . Frequency of Communication with Friends and Family:   . Frequency of Social Gatherings with Friends and Family:   . Attends Religious Services:   . Active Member of Clubs or Organizations:   . Attends Archivist Meetings:   Marland Kitchen Marital Status:   Intimate Partner Violence:   . Fear of Current or Ex-Partner:   . Emotionally Abused:   Marland Kitchen Physically Abused:   . Sexually Abused:     Family History  Problem Relation Age of Onset  . Breast cancer Neg Hx       Review of systems complete and found to be negative unless listed above       PHYSICAL EXAM  General: Well developed, well nourished, in no acute distress HEENT:  Normocephalic and atramatic Neck:  No JVD.  Lungs: Clear bilaterally to auscultation and percussion. Heart: HRRR . Normal S1 and S2 without gallops or murmurs.  Abdomen: Bowel sounds are positive, abdomen soft and non-tender  Msk:  Back normal, normal gait. Normal strength and tone for age. Extremities: No clubbing, cyanosis or edema.   Neuro: Alert and oriented X 3. Psych:  Good affect, responds appropriately  Labs:   Lab Results  Component Value Date  WBC 6.6 05/22/2019   HGB 10.0 (L) 05/22/2019   HCT 33.8 (L) 05/22/2019   MCV 75.6 (L) 05/22/2019   PLT 328 05/22/2019    Recent Labs  Lab 05/21/19 1437 05/21/19 1437 05/22/19 0459  NA 142   < > 142  K 4.0   < > 3.8  CL 106   < > 105  CO2 26   < > 28  BUN 46*   < > 47*  CREATININE 1.93*   < > 2.00*  CALCIUM 9.0   < > 8.7*  PROT 6.7  --   --   BILITOT 1.4*  --   --   ALKPHOS 223*  --   --   ALT 33  --   --   AST 44*  --   --   GLUCOSE 154*   < > 120*   < > = values in this interval not displayed.   No results found for: CKTOTAL, CKMB, CKMBINDEX, TROPONINI  Lab Results  Component Value Date   CHOL 100 05/22/2019   CHOL 116 04/30/2019   CHOL 126 03/07/2019   Lab Results  Component Value Date   HDL 46 05/22/2019   HDL 41 04/30/2019   HDL 32 (L) 03/07/2019   Lab Results  Component Value Date   LDLCALC 43 05/22/2019   LDLCALC 58 04/30/2019   LDLCALC 50 03/07/2019   Lab Results  Component Value Date   TRIG 56 05/22/2019   TRIG 86 04/30/2019   TRIG 222 (H) 03/07/2019   TRIG 232 (H) 03/07/2019   Lab Results  Component Value Date   CHOLHDL 2.2 05/22/2019   CHOLHDL 2.8 04/30/2019   CHOLHDL 3.9 03/07/2019   No results found for: LDLDIRECT    Radiology: DG Chest 2 View  Result Date: 05/21/2019 CLINICAL DATA:  Short of breath EXAM: CHEST - 2 VIEW COMPARISON:  05/20/2019 FINDINGS: Marked cardiac enlargement  unchanged. Pulmonary vascular congestion unchanged. Bibasilar airspace disease left greater than right unchanged. No significant pleural effusion. IMPRESSION: Cardiac enlargement with vascular congestion unchanged. Bibasilar atelectasis/infiltrate left greater than right is unchanged. Electronically Signed   By: Franchot Gallo M.D.   On: 05/21/2019 15:31   DG Chest 2 View  Result Date: 05/20/2019 CLINICAL DATA:  Shortness of breath, history CHF, dilated cardiomyopathy, COVID-19, hypertension EXAM: CHEST - 2 VIEW COMPARISON:  Portable exam of 04/29/2019 FINDINGS: Enlargement of cardiac silhouette with pulmonary vascular congestion. Mediastinal contours normal. Minimal interstitial prominence diffusely throughout both lungs suspect representing mild chronic failure. No definite acute infiltrate, pleural effusion or pneumothorax. IMPRESSION: Enlargement of cardiac silhouette with pulmonary vascular congestion and minimal chronic failure. No acute abnormalities. Electronically Signed   By: Lavonia Dana M.D.   On: 05/20/2019 19:50   DG Chest 2 View  Result Date: 04/28/2019 CLINICAL DATA:  Shortness of breath. EXAM: CHEST - 2 VIEW COMPARISON:  04/08/2019 FINDINGS: The cardio pericardial silhouette is enlarged. There is pulmonary vascular congestion without overt pulmonary edema. No overt edema. No focal airspace consolidation or pleural effusion. The visualized bony structures of the thorax are intact. IMPRESSION: Enlargement of the cardiopericardial silhouette, similar to prior with vascular congestion. Electronically Signed   By: Misty Stanley M.D.   On: 04/28/2019 19:24   CT HEAD WO CONTRAST  Result Date: 05/21/2019 CLINICAL DATA:  Encephalopathy EXAM: CT HEAD WITHOUT CONTRAST TECHNIQUE: Contiguous axial images were obtained from the base of the skull through the vertex without intravenous contrast. COMPARISON:  01/17/2019 FINDINGS: Brain: No acute intracranial  abnormality. Specifically, no hemorrhage,  hydrocephalus, mass lesion, acute infarction, or significant intracranial injury. Vascular: No hyperdense vessel or unexpected calcification. Skull: No acute calvarial abnormality. Sinuses/Orbits: Visualized paranasal sinuses and mastoids clear. Orbital soft tissues unremarkable. Other: None IMPRESSION: Normal study. Electronically Signed   By: Rolm Baptise M.D.   On: 05/21/2019 22:58   DG Chest Portable 1 View  Result Date: 04/29/2019 CLINICAL DATA:  Cough. Congestive heart failure. EXAM: PORTABLE CHEST 1 VIEW COMPARISON:  One-view chest x-ray 04/28/2019 FINDINGS: The heart is enlarged. Mild pulmonary vascular congestion is similar the prior exam. No frank edema is present. No definite effusions are present. No airspace consolidation is present. Visualized soft tissues and bony thorax are otherwise unremarkable. IMPRESSION: 1. Cardiomegaly and mild pulmonary vascular congestion without frank edema. 2. No focal airspace disease. Electronically Signed   By: San Morelle M.D.   On: 04/29/2019 06:54   ECHOCARDIOGRAM COMPLETE  Result Date: 05/15/2019    ECHOCARDIOGRAM REPORT   Patient Name:   Katelyn Lane Date of Exam: 04/29/2019 Medical Rec #:  [id number]     Height:       68.0 in Accession #:    [id number]    Weight:       227.1 lb Date of Birth:  October 15, 1955    BSA:          2.157 m Patient Age:    48 years      BP:           138/102 mmHg Patient Gender: F             HR:           88 bpm. Exam Location:  [facility] Procedure: 2D Echo, Cardiac Doppler, Color Doppler and Intracardiac            Opacification Agent Indications:     H41.93 Acute Diastolic CHF  History:         Patient has prior history of Echocardiogram examinations, most                  recent 01/19/2019. Risk Factors:Dyslipidemia, Current Smoker and                  Polysubstance abuse. Cocaine abuse. COVID-19. Congestive heart                  failure. Renal disorder. Stroke.  Sonographer:     Wilford Sports Rodgers-Jones Referring Phys:  [place] Diagnosing Phys: Serafina Royals MD IMPRESSIONS  1. Left ventricular ejection fraction, by estimation, is <20%. The left ventricle has severely decreased function. The left ventricle demonstrates global hypokinesis. The left ventricular internal cavity size was severely dilated. There is moderate left  ventricular hypertrophy. Left ventricular diastolic parameters are indeterminate.  2. Right ventricular systolic function is normal. The right ventricular size is normal. There is severely elevated pulmonary artery systolic pressure.  3. Left atrial size was mildly dilated.  4. Right atrial size was mildly dilated.  5. The mitral valve is normal in structure. Moderate mitral valve regurgitation.  6. Tricuspid valve regurgitation is severe.  7. The aortic valve is normal in structure. Aortic valve regurgitation is trivial. FINDINGS  Left Ventricle: Left ventricular ejection fraction, by estimation, is <20%. The left ventricle has severely decreased function. The left ventricle demonstrates global hypokinesis. Definity contrast agent was given IV to delineate the left ventricular endocardial borders. The left ventricular internal cavity size was severely dilated. There is moderate left ventricular hypertrophy.  Left ventricular diastolic parameters are indeterminate. Right Ventricle: The right ventricular size is normal. No increase in right ventricular wall thickness. Right ventricular systolic function is normal. There is severely elevated pulmonary artery systolic pressure. The tricuspid regurgitant velocity is 3.79 m/s, and with an assumed right atrial pressure of 10 mmHg, the estimated right ventricular systolic pressure is 30.8 mmHg. Left Atrium: Left atrial size was mildly dilated. Right Atrium: Right atrial size was mildly dilated. Pericardium: There is no evidence of pericardial effusion. Mitral Valve: The mitral valve is normal in structure. Moderate mitral valve regurgitation. Tricuspid Valve: The  tricuspid valve is normal in structure. Tricuspid valve regurgitation is severe. Aortic Valve: The aortic valve is normal in structure. Aortic valve regurgitation is trivial. Pulmonic Valve: The pulmonic valve was normal in structure. Pulmonic valve regurgitation is mild. Aorta: The aortic root and ascending aorta are structurally normal, with no evidence of dilitation. IAS/Shunts: No atrial level shunt detected by color flow Doppler.  LEFT VENTRICLE PLAX 2D LVIDd:         6.64 cm  Diastology LVIDs:         6.13 cm  LV e' lateral:   4.79 cm/s LV PW:         0.97 cm  LV E/e' lateral: 17.4 LV IVS:        0.70 cm  LV e' medial:    3.70 cm/s LVOT diam:     2.00 cm  LV E/e' medial:  22.5 LV SV:         26 LV SV Index:   12 LVOT Area:     3.14 cm  RIGHT VENTRICLE RV Basal diam:  5.12 cm RV S prime:     17.80 cm/s TAPSE (M-mode): 2.0 cm LEFT ATRIUM              Index       RIGHT ATRIUM           Index LA diam:        6.50 cm  3.01 cm/m  RA Area:     27.20 cm LA Vol (A2C):   150.0 ml 69.54 ml/m RA Volume:   102.00 ml 47.29 ml/m LA Vol (A4C):   108.0 ml 50.07 ml/m LA Biplane Vol: 131.0 ml 60.73 ml/m  AORTIC VALVE LVOT Vmax:   67.23 cm/s LVOT Vmean:  42.867 cm/s LVOT VTI:    0.082 m  AORTA Ao Root diam: 3.10 cm MV E velocity: 83.40 cm/s   TRICUSPID VALVE MV A velocity: 101.00 cm/s  TR Peak grad:   57.5 mmHg MV E/A ratio:  0.83         TR Vmax:        379.00 cm/s                              SHUNTS                             Systemic VTI:  0.08 m                             Systemic Diam: 2.00 cm Serafina Royals MD Electronically signed by Serafina Royals MD Signature Date/Time: 05/15/2019/8:43:16 AM    Final     EKG: NSR 80 nsswt  RBBB   ASSESSMENT AND PLAN:  Congestive heart failure systolic and diastolic  dysfunction Cardiomyopathy ejection fraction 20% Hypertension currently on medical therapy  CRI stage III SOB Hx COVID . Plan Continue diuretic therapy he has Agreed admit to telemetry rule out  microinfarction follow-up EKGs troponins Discontinue heparin therapy after 24 to 48 hours  Continue diuretic therapy Continue Entresto therapy for cardiomyopathy and heart failure Recommend the patient be more compliant follow-up with heart failure clinic Follow up with nephrology for renal insuff    Signed: Yolonda Kida MD 05/22/2019, 12:51 PM

## 2019-05-22 NOTE — Progress Notes (Signed)
Patient ID: Katelyn Lane, female   DOB: 09-23-1955, 64 y.o.   MRN: [id number] [facility] PROGRESS NOTE  Shamiyah Ngu OHY:[id number] DOB: [birth year] DOA: 05/21/2019 PCP: [name], MD  HPI/Subjective: Patient came up to the floor today from the emergency room this morning.  Patient very sleepy.  Answered a few questions and went back to sleep.  Nurse message me that she was agitated afterwards.  Staff knows her from previous admissions  Objective: Vitals:   05/22/19 0849 05/22/19 1304  BP: (!) 127/114 (!) 127/95  Pulse: 93 86  Resp: 18 (!) 22  Temp: 98.3 F (36.8 C)   SpO2: 100% 100%    Intake/Output Summary (Last 24 hours) at 05/22/2019 1454 Last data filed at 05/22/2019 1300 Gross per 24 hour  Intake 597 ml  Output 200 ml  Net 397 ml   Filed Weights   05/21/19 1435 05/22/19 0720 05/22/19 0800  Weight: 98 kg 100.7 kg 99.7 kg    ROS: Review of Systems  Unable to perform ROS: Acuity of condition  Respiratory: Positive for shortness of breath.   Cardiovascular: Negative for chest pain.   Exam: Physical Exam  Constitutional: She appears lethargic.  HENT:  Nose: No mucosal edema.  Eyes: Conjunctivae and lids are normal.  Neck: Carotid bruit is not present.  Cardiovascular: Regular rhythm, S1 normal, S2 normal and normal heart sounds.  Respiratory: She has decreased breath sounds in the right middle field, the right lower field, the left middle field and the left lower field. She has wheezes in the right middle field, the right lower field, the left middle field and the left lower field.  GI: Soft. There is no abdominal tenderness.  Musculoskeletal:     Right ankle: No swelling.     Left ankle: No swelling.  Lymphadenopathy:    She has no cervical adenopathy.  Neurological: She appears lethargic.  Skin: Skin is warm. No rash noted.  Psychiatric:  Patient tired answered a few questions and went back to sleep      Data Reviewed: Basic Metabolic  Panel: Recent Labs  Lab 05/20/19 1802 05/21/19 1437 05/22/19 0459  NA 146* 142 142  K 4.1 4.0 3.8  CL 108 106 105  CO2 29 26 28   GLUCOSE 124* 154* 120*  BUN 54* 46* 47*  CREATININE 2.30* 1.93* 2.00*  CALCIUM 8.7* 9.0 8.7*   Liver Function Tests: Recent Labs  Lab 05/21/19 1437  AST 44*  ALT 33  ALKPHOS 223*  BILITOT 1.4*  PROT 6.7  ALBUMIN 3.2*   CBC: Recent Labs  Lab 05/20/19 1802 05/21/19 1437 05/22/19 0459  WBC 6.6 6.5 6.6  HGB 10.4* 10.5* 10.0*  HCT 35.0* 35.6* 33.8*  MCV 75.9* 76.6* 75.6*  PLT 383 291 328   BNP (last 3 results) Recent Labs    04/08/19 0737 04/29/19 0718 05/21/19 1437  BNP >4,500.0* >4,500.0* >4,500.0*     CBG: Recent Labs  Lab 05/21/19 1446  GLUCAP 148*      Studies: DG Chest 2 View  Result Date: 05/21/2019 CLINICAL DATA:  Short of breath EXAM: CHEST - 2 VIEW COMPARISON:  05/20/2019 FINDINGS: Marked cardiac enlargement unchanged. Pulmonary vascular congestion unchanged. Bibasilar airspace disease left greater than right unchanged. No significant pleural effusion. IMPRESSION: Cardiac enlargement with vascular congestion unchanged. Bibasilar atelectasis/infiltrate left greater than right is unchanged. Electronically Signed   By: Franchot Gallo M.D.   On: 05/21/2019 15:31   DG Chest 2 View  Result Date: 05/20/2019 CLINICAL DATA:  Shortness of breath, history CHF, dilated cardiomyopathy, COVID-19, hypertension EXAM: CHEST - 2 VIEW COMPARISON:  Portable exam of 04/29/2019 FINDINGS: Enlargement of cardiac silhouette with pulmonary vascular congestion. Mediastinal contours normal. Minimal interstitial prominence diffusely throughout both lungs suspect representing mild chronic failure. No definite acute infiltrate, pleural effusion or pneumothorax. IMPRESSION: Enlargement of cardiac silhouette with pulmonary vascular congestion and minimal chronic failure. No acute abnormalities. Electronically Signed   By: Lavonia Dana M.D.   On:  05/20/2019 19:50   CT HEAD WO CONTRAST  Result Date: 05/21/2019 CLINICAL DATA:  Encephalopathy EXAM: CT HEAD WITHOUT CONTRAST TECHNIQUE: Contiguous axial images were obtained from the base of the skull through the vertex without intravenous contrast. COMPARISON:  01/17/2019 FINDINGS: Brain: No acute intracranial abnormality. Specifically, no hemorrhage, hydrocephalus, mass lesion, acute infarction, or significant intracranial injury. Vascular: No hyperdense vessel or unexpected calcification. Skull: No acute calvarial abnormality. Sinuses/Orbits: Visualized paranasal sinuses and mastoids clear. Orbital soft tissues unremarkable. Other: None IMPRESSION: Normal study. Electronically Signed   By: Rolm Baptise M.D.   On: 05/21/2019 22:58    Scheduled Meds: . amLODipine  5 mg Oral q morning - 10a  . aspirin EC  81 mg Oral Daily  . atorvastatin  20 mg Oral q1800  . budesonide (PULMICORT) nebulizer solution  0.5 mg Nebulization BID  . carvedilol  3.125 mg Oral BID WC  . furosemide  40 mg Intravenous Q12H  . ipratropium-albuterol  3 mL Nebulization Q6H  . methylPREDNISolone (SOLU-MEDROL) injection  40 mg Intravenous Q12H  . sacubitril-valsartan  1 tablet Oral BID   Continuous Infusions: . heparin 1,000 Units/hr (05/21/19 2334)    Assessment/Plan:  1. Acute on chronic systolic congestive heart failure.  Diuresis with IV Lasix.  Continue Entresto, Coreg. 2. COPD exacerbation start steroids and nebulizer treatments.  Patient advised not to smoke crack cocaine. 3. Accelerated hypertension on Coreg but I started amlodipine.  Entresto should also help blood pressure. 4. Pulmonary hypertension.  Try to reduce blood pressure. 5. Hyperlipidemia on Lipitor 6. Chronic kidney disease stage IIIb 7. Acute metabolic encephalopathy.  Reassess tomorrow.  As needed Haldol.  Cocaine in urine drug toxicology 8. History of left ventricular thrombus currently on heparin drip.  Looks like patient was on Xarelto as  outpatient may have to pursue switch to Eliquis    Code Status:     Code Status Orders  (From admission, onward)         Start     Ordered   05/21/19 2047  Full code  Continuous     05/21/19 2056        Code Status History    Date Active Date Inactive Code Status Order ID Comments User Context   04/29/2019 0952 05/03/2019 1837 Full Code [id number]  Ivor Costa, MD ED   04/08/2019 0950 04/09/2019 1806 Full Code [id number]  Collier Bullock, MD ED   03/24/2019 2343 03/28/2019 1724 Full Code [id number]  Mansy, Arvella Merles, MD ED   03/06/2019 1425 03/10/2019 1658 Full Code [id number]  Ivor Costa, MD Inpatient   02/13/2019 1508 02/16/2019 1846 Full Code [id number]  Max Sane, MD ED   01/31/2019 0815 02/04/2019 1748 Full Code [id number]  Para Skeans, MD ED   01/31/2019 0814 01/31/2019 0815 Full Code [id number]  Para Skeans, MD ED   01/18/2019 0038 01/21/2019 1923 DNR [id number]  Lenore Cordia, MD ED   12/29/2018 0449 01/01/2019 2033 Full Code [id number]  Mansy, Arvella Merles, MD ED   Advance Care Planning  Activity     Family Communication: Refuse me calling any family Disposition Plan: Status is: Inpatient  Dispo: The patient is from: Home              Anticipated d/c is to: Home              Anticipated d/c date is: 05/25/2019              Patient currently wheezing and short of breath.  Patient will likely need a few days of IV diuresis and steroids and nebulizer treatments in order to improve aeration.  Consultants:  Cardiology  Time spent: 27 minutes Case discussed with transitional care team and nursing staff  [facility]  [facility]

## 2019-05-22 NOTE — ED Notes (Signed)
This RN attempting to wean patient off BiPap per MD order and patient request. BiPap placed on standy and patient placed on 3L Max after consulting with RT.

## 2019-05-22 NOTE — ED Notes (Signed)
Pt taken off bipap to ambulate to bathroom at this time. Pt tolerated well, no assistance by this RN required.

## 2019-05-22 NOTE — Progress Notes (Addendum)
Patient transferred to rm 254. Patient has no complaints of pain, VSS, ordered breakfast and resting comfortably in bed. Patient stated she hates being bothered and refused a REDS vest reading. This RN stated the importance of completing the REDS vest. Patient not compliant.

## 2019-05-22 NOTE — Consult Note (Signed)
ANTICOAGULATION CONSULT NOTE - Initial Consult  Pharmacy Consult for Heparin infusion Indication: chest pain/ACS  Allergies  Allergen Reactions  . Penicillins Hives, Rash and Other (See Comments)    Did it involve swelling of the face/tongue/throat, SOB, or low BP? No Did it involve sudden or severe rash/hives, skin peeling, or any reaction on the inside of your mouth or nose? Yes Did you need to seek medical attention at a hospital or doctor's office? Yes When did it last happen? >25 years If all above answers are "NO", may proceed with cephalosporin use.   . Ace Inhibitors Nausea And Vomiting    Patient Measurements: Height: 5\' 8"  (172.7 cm) Weight: 98 kg (216 lb 0.8 oz) IBW/kg (Calculated) : 63.9 Heparin Dosing Weight: 85.3 kg  Vital Signs: BP: 120/93 (05/14 0430) Pulse Rate: 92 (05/14 0430)  Labs: Recent Labs    05/20/19 1802 05/20/19 1802 05/21/19 1437 05/21/19 1645 05/21/19 2116 05/22/19 0459  HGB 10.4*   < > 10.5*  --   --  10.0*  HCT 35.0*  --  35.6*  --   --  33.8*  PLT 383  --  291  --   --  328  APTT  --   --   --   --  27 71*  LABPROT  --   --   --   --  16.5*  --   INR  --   --   --   --  1.4*  --   HEPARINUNFRC  --   --   --   --  0.58 0.48  CREATININE 2.30*  --  1.93*  --   --  2.00*  TROPONINIHS  --   --  123* 126*  --   --    < > = values in this interval not displayed.    Estimated Creatinine Clearance: 35.2 mL/min (A) (by C-G formula based on SCr of 2 mg/dL (H)).   Medical History: Past Medical History:  Diagnosis Date  . CHF (congestive heart failure) ([place])   . COVID-19   . Hyperlipidemia   . Hypertension   . Renal disorder     Medications:  Rivaroxaban 20 mg daily (patient reported last dose was 5/12 evening) for LV thrombus discovered on 01/19/2019 ECHO  There are concerns about non-compliance with medications  Assessment: Patient is a 64 y/o F with past medical history significant for CHF, LV mural thrombus, CKD stage  IIIb, stroke who was sent to ED from CHF clinic for CHF exacerbation. Troponins 126 which appear slightly elevated over baseline. EKG preliminary result indicative of lateral ischemia. Pharmacy has been consulted to initiate heparin infusion.  Baseline CBC with Hgb of 10.5 (c/w baseline), platelets within normal limits. Baseline aPTT within normal limits, baseline heparin level elevated to 0.58, baseline INR elevated to 1.4.  Goal of Therapy:  aPTT 66-102 seconds Monitor platelets by anticoagulation protocol: Yes   Plan:  -Initiate heparin infusion at 1000 units/hr, no bolus -aPTT in 8 hours. Will follow aPTT due to interference of rivaroxaban with anti Xa level. Check heparin level with aPTT for correlation -Daily CBC per protocol  0514 0459 aPTT 71, therapeutic x 1.  HL 0.48.  CBC stable.  Will continue Heparin drip at 1000 units/hr and recheck aPTT in 6 hours.    Hart Robinsons A  05/22/2019,5:56 AM

## 2019-05-22 NOTE — Progress Notes (Signed)
PT Cancellation Note  Patient Details Name: Katelyn Lane MRN: [id number] DOB: 1955/07/31   Cancelled Treatment:    Reason Eval/Treat Not Completed: Patient's level of consciousness Chart reviewed, spoke with nursing prior to attempting eval.  She reports that pt was recently agitated and she needed to administer Haldol, will re-attempt PT exam at later date when pt is more appropriate.  Kreg Shropshire, DPT 05/22/2019, 11:55 AM

## 2019-05-23 ENCOUNTER — Inpatient Hospital Stay: Payer: Medicaid Other

## 2019-05-23 DIAGNOSIS — I5021 Acute systolic (congestive) heart failure: Secondary | ICD-10-CM

## 2019-05-23 DIAGNOSIS — G9341 Metabolic encephalopathy: Secondary | ICD-10-CM

## 2019-05-23 DIAGNOSIS — J9601 Acute respiratory failure with hypoxia: Secondary | ICD-10-CM

## 2019-05-23 LAB — BLOOD GAS, ARTERIAL
Acid-Base Excess: 2.9 mmol/L — ABNORMAL HIGH (ref 0.0–2.0)
Bicarbonate: 27.9 mmol/L (ref 20.0–28.0)
FIO2: 0.36
O2 Content: 4 L/min
O2 Saturation: 99.3 %
Patient temperature: 37
pCO2 arterial: 43 mmHg (ref 32.0–48.0)
pH, Arterial: 7.42 (ref 7.350–7.450)
pO2, Arterial: 150 mmHg — ABNORMAL HIGH (ref 83.0–108.0)

## 2019-05-23 LAB — CBC
HCT: 33.5 % — ABNORMAL LOW (ref 36.0–46.0)
Hemoglobin: 10.3 g/dL — ABNORMAL LOW (ref 12.0–15.0)
MCH: 22.5 pg — ABNORMAL LOW (ref 26.0–34.0)
MCHC: 30.7 g/dL (ref 30.0–36.0)
MCV: 73.1 fL — ABNORMAL LOW (ref 80.0–100.0)
Platelets: 309 10*3/uL (ref 150–400)
RBC: 4.58 MIL/uL (ref 3.87–5.11)
RDW: 20.5 % — ABNORMAL HIGH (ref 11.5–15.5)
WBC: 5.4 10*3/uL (ref 4.0–10.5)
nRBC: 0 % (ref 0.0–0.2)

## 2019-05-23 LAB — BASIC METABOLIC PANEL
Anion gap: 11 (ref 5–15)
BUN: 52 mg/dL — ABNORMAL HIGH (ref 8–23)
CO2: 26 mmol/L (ref 22–32)
Calcium: 8.9 mg/dL (ref 8.9–10.3)
Chloride: 106 mmol/L (ref 98–111)
Creatinine, Ser: 2.25 mg/dL — ABNORMAL HIGH (ref 0.44–1.00)
GFR calc Af Amer: 26 mL/min — ABNORMAL LOW (ref >60)
GFR calc non Af Amer: 22 mL/min — ABNORMAL LOW (ref >60)
Glucose, Bld: 173 mg/dL — ABNORMAL HIGH (ref 70–99)
Potassium: 4.1 mmol/L (ref 3.5–5.1)
Sodium: 143 mmol/L (ref 135–145)

## 2019-05-23 LAB — HEPARIN LEVEL (UNFRACTIONATED): Heparin Unfractionated: 0.74 IU/mL — ABNORMAL HIGH (ref 0.30–0.70)

## 2019-05-23 LAB — APTT: aPTT: 131 seconds — ABNORMAL HIGH (ref 24–36)

## 2019-05-23 MED ORDER — SODIUM CHLORIDE 0.9 % IV SOLN
500.0000 mg | INTRAVENOUS | Status: DC
  Administered 2019-05-23 – 2019-05-24 (×2): 500 mg via INTRAVENOUS
  Filled 2019-05-23 (×3): qty 500

## 2019-05-23 MED ORDER — SODIUM CHLORIDE 0.9 % IV SOLN
2.0000 g | INTRAVENOUS | Status: DC
  Administered 2019-05-23 – 2019-05-25 (×3): 2 g via INTRAVENOUS
  Filled 2019-05-23 (×3): qty 2
  Filled 2019-05-23: qty 20

## 2019-05-23 MED ORDER — HYDRALAZINE HCL 25 MG PO TABS
25.0000 mg | ORAL_TABLET | Freq: Three times a day (TID) | ORAL | Status: DC
  Administered 2019-05-23 – 2019-05-26 (×10): 25 mg via ORAL
  Filled 2019-05-23 (×11): qty 1

## 2019-05-23 MED ORDER — RIVAROXABAN 15 MG PO TABS
15.0000 mg | ORAL_TABLET | Freq: Every day | ORAL | Status: DC
  Administered 2019-05-23 – 2019-05-25 (×3): 15 mg via ORAL
  Filled 2019-05-23 (×4): qty 1

## 2019-05-23 NOTE — Progress Notes (Signed)
Notified by CCMD pt had 16 bt run of Vtach. In to see pt. Pt sleeping. Vs taken. Within normal limits. Continuing to monitor

## 2019-05-23 NOTE — Consult Note (Signed)
[facility] for Heparin infusion Indication: chest pain/ACS  Allergies  Allergen Reactions  . Penicillins Hives, Rash and Other (See Comments)    Did it involve swelling of the face/tongue/throat, SOB, or low BP? No Did it involve sudden or severe rash/hives, skin peeling, or any reaction on the inside of your mouth or nose? Yes Did you need to seek medical attention at a hospital or doctor's office? Yes When did it last happen? >25 years If all above answers are "NO", may proceed with cephalosporin use.   . Ace Inhibitors Nausea And Vomiting    Patient Measurements: Height: 5\' 9"  (175.3 cm) Weight: 100.6 kg (221 lb 12.8 oz) IBW/kg (Calculated) : 66.2 Heparin Dosing Weight: 85.3 kg  Vital Signs: Temp: 98.5 F (36.9 C) (05/15 0418) Temp Source: Oral (05/14 1958) BP: 121/87 (05/15 0416) Pulse Rate: 82 (05/15 0416)  Labs: Recent Labs    05/20/19 1802 05/20/19 1802 05/21/19 1437 05/21/19 1437 05/21/19 1645 05/21/19 2116 05/22/19 0459 05/22/19 1138 05/23/19 0603  HGB 10.4*   < > 10.5*   < >  --   --  10.0*  --  10.3*  HCT 35.0*   < > 35.6*  --   --   --  33.8*  --  33.5*  PLT 383   < > 291  --   --   --  328  --  309  APTT  --   --   --    < >  --  27 71* 70* 131*  LABPROT  --   --   --   --   --  16.5*  --   --   --   INR  --   --   --   --   --  1.4*  --   --   --   HEPARINUNFRC  --   --   --   --   --  0.58 0.48  --  0.74*  CREATININE 2.30*  --  1.93*  --   --   --  2.00*  --   --   TROPONINIHS  --   --  123*  --  126*  --   --   --   --    < > = values in this interval not displayed.    Estimated Creatinine Clearance: 36.4 mL/min (A) (by C-G formula based on SCr of 2 mg/dL (H)).   Medical History: Past Medical History:  Diagnosis Date  . CHF (congestive heart failure) ([place])   . COVID-19   . Hyperlipidemia   . Hypertension   . Renal disorder     Medications:  Rivaroxaban 20 mg daily (patient reported last dose  was 5/12 evening) for LV thrombus discovered on 01/19/2019 ECHO  There are concerns about non-compliance with medications  Assessment: Patient is a 64 y/o F with past medical history significant for CHF, LV mural thrombus, CKD stage IIIb, stroke who was sent to ED from CHF clinic for CHF exacerbation. Troponins 126 which appear slightly elevated over baseline. EKG preliminary result indicative of lateral ischemia. Pharmacy has been consulted to initiate heparin infusion.  Baseline CBC with Hgb of 10.5 (c/w baseline), platelets within normal limits. Baseline aPTT within normal limits, baseline heparin level elevated to 0.58, baseline INR elevated to 1.4.  5/14 0459 APTT 71  5/14 1138 aPTT 70 HL 0.48  0515 0603 aPTT 131, HL 0.71, SUPRAtherapeutic, CBC stable.  Goal of Therapy:  aPTT 66-102 seconds Monitor platelets by anticoagulation protocol: Yes   Plan:  APTT is now SUPRAtherapeutic.  HL also SUPRAtherapeutic but Heparin levels seem close to correlating. Will decrease heparin infusion rate to 800 units/hr and recheck aPTT in 6 hours.  CBC stable.    Ena Dawley, PharmD 05/23/2019,6:55 AM

## 2019-05-23 NOTE — Progress Notes (Signed)
Neurology:  64 y.o. 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  Suspected to have heart failure exacerbation along with lower extremity edema   Patient presented with acute encephalopathy. Suspected to have COPD exacerbation.  MRI reviewed in comparison to prior MRI extension of stroke from prior one.   - I think symptoms related to her heart failure, COPD and CKD which contributed to metabolic encephalopathy.

## 2019-05-23 NOTE — Progress Notes (Signed)
IS & Flutter left in room. Education not completed due to pt lethargy. RN aware and Understands to educate pt when she wakes up

## 2019-05-23 NOTE — Progress Notes (Signed)
[facility] Cardiology    SUBJECTIVE: Patient resting quietly no complaints no worsening shortness of breath no evidence of bleeding still on heparin will consider discontinuing and restarting Xarelto   Vitals:   05/23/19 0416 05/23/19 0418 05/23/19 0542 05/23/19 0559  BP: 121/87     Pulse: 82     Resp: 16  20   Temp:  98.5 F (36.9 C)    TempSrc:      SpO2: 99%   93%  Weight:  100.6 kg    Height:         Intake/Output Summary (Last 24 hours) at 05/23/2019 0719 Last data filed at 05/23/2019 0044 Gross per 24 hour  Intake 511.92 ml  Output 800 ml  Net -288.08 ml      PHYSICAL EXAM  General: Well developed, well nourished, in no acute distress HEENT:  Normocephalic and atramatic Neck:  No JVD.  Lungs: Clear bilaterally to auscultation and percussion. Heart: HRRR . Normal S1 and S2 without gallops or murmurs.  Abdomen: Bowel sounds are positive, abdomen soft and non-tender  Msk:  Back normal, normal gait. Normal strength and tone for age. Extremities: No clubbing, cyanosis or edema.   Neuro: Alert and oriented X 3. Psych:  Good affect, responds appropriately   LABS: Basic Metabolic Panel: Recent Labs    05/22/19 0459 05/23/19 0603  NA 142 143  K 3.8 4.1  CL 105 106  CO2 28 26  GLUCOSE 120* 173*  BUN 47* 52*  CREATININE 2.00* 2.25*  CALCIUM 8.7* 8.9   Liver Function Tests: Recent Labs    05/21/19 1437  AST 44*  ALT 33  ALKPHOS 223*  BILITOT 1.4*  PROT 6.7  ALBUMIN 3.2*   No results for input(s): LIPASE, AMYLASE in the last 72 hours. CBC: Recent Labs    05/22/19 0459 05/23/19 0603  WBC 6.6 5.4  HGB 10.0* 10.3*  HCT 33.8* 33.5*  MCV 75.6* 73.1*  PLT 328 309   Cardiac Enzymes: No results for input(s): CKTOTAL, CKMB, CKMBINDEX, TROPONINI in the last 72 hours. BNP: Invalid input(s): POCBNP D-Dimer: No results for input(s): DDIMER in the last 72 hours. Hemoglobin A1C: No results for input(s): HGBA1C in the last 72 hours. Fasting Lipid  Panel: Recent Labs    05/22/19 0459  CHOL 100  HDL 46  LDLCALC 43  TRIG 56  CHOLHDL 2.2   Thyroid Function Tests: No results for input(s): TSH, T4TOTAL, T3FREE, THYROIDAB in the last 72 hours.  Invalid input(s): FREET3 Anemia Panel: No results for input(s): VITAMINB12, FOLATE, FERRITIN, TIBC, IRON, RETICCTPCT in the last 72 hours.  DG Chest 2 View  Result Date: 05/21/2019 CLINICAL DATA:  Short of breath EXAM: CHEST - 2 VIEW COMPARISON:  05/20/2019 FINDINGS: Marked cardiac enlargement unchanged. Pulmonary vascular congestion unchanged. Bibasilar airspace disease left greater than right unchanged. No significant pleural effusion. IMPRESSION: Cardiac enlargement with vascular congestion unchanged. Bibasilar atelectasis/infiltrate left greater than right is unchanged. Electronically Signed   By: Franchot Gallo M.D.   On: 05/21/2019 15:31   CT HEAD WO CONTRAST  Result Date: 05/21/2019 CLINICAL DATA:  Encephalopathy EXAM: CT HEAD WITHOUT CONTRAST TECHNIQUE: Contiguous axial images were obtained from the base of the skull through the vertex without intravenous contrast. COMPARISON:  01/17/2019 FINDINGS: Brain: No acute intracranial abnormality. Specifically, no hemorrhage, hydrocephalus, mass lesion, acute infarction, or significant intracranial injury. Vascular: No hyperdense vessel or unexpected calcification. Skull: No acute calvarial abnormality. Sinuses/Orbits: Visualized paranasal sinuses and mastoids clear. Orbital soft tissues unremarkable. Other:  None IMPRESSION: Normal study. Electronically Signed   By: Rolm Baptise M.D.   On: 05/21/2019 22:58     Echo previous echo with severely reduced left ventricular function  TELEMETRY: Sinus rhythm bundle branch block evidence of short nonsustained VT  ASSESSMENT AND PLAN:  Principal Problem:   Acute on chronic combined systolic and diastolic CHF (congestive heart failure) ([facility]) Active Problems:   Dilated cardiomyopathy ([facility])   Elevated  troponin   Hyperlipidemia   Crack cocaine use   LV (left ventricular) mural thrombus   Chronic renal failure, stage 3b   Acute metabolic encephalopathy   History of substance abuse ([facility])   CHF (congestive heart failure) ([place])   COPD with acute exacerbation ([place])   Pulmonary hypertension ([place])    Plan Continue heart failure management diuretics Coreg Entresto Continue statin therapy for hyperlipidemia Have the patient continue to follow-up with heart failure clinic Continue aspirin therapy Try to counsel the patient on compliance with medications Continue Xarelto therapy for LV thrombus We maintain the patient on Protonix for reflux symptoms   Yolonda Kida, MD 05/23/2019 7:19 AM

## 2019-05-23 NOTE — Progress Notes (Signed)
Pt took a break from bipap from 0052 until 0256 using O22 at 6-10 liters [place] but O2 sats ultimately dropped into 80's so bipap reapplied and O2 sat 100% on bipap.

## 2019-05-23 NOTE — Progress Notes (Signed)
Patient ID: Katelyn Lane, female   DOB: Oct 24, 1955, 64 y.o.   MRN: [id number] [facility] PROGRESS NOTE  Katelyn Lane DOB: 1955/02/12 DOA: 05/21/2019 PCP: [name], MD  HPI/Subjective: Patient opens her eyes and falls back to sleep very easily.  This happened multiple times during my interview with her today.  Objective: Vitals:   05/23/19 1237 05/23/19 1257  BP: 135/90   Pulse:    Resp:    Temp:    SpO2:  100%    Intake/Output Summary (Last 24 hours) at 05/23/2019 1425 Last data filed at 05/23/2019 0044 Gross per 24 hour  Intake 151.92 ml  Output 600 ml  Net -448.08 ml   Filed Weights   05/22/19 0720 05/22/19 0800 05/23/19 0418  Weight: 100.7 kg 99.7 kg 100.6 kg    ROS: Review of Systems  Unable to perform ROS: Acuity of condition  Respiratory: Positive for shortness of breath.   Cardiovascular: Negative for chest pain.  Gastrointestinal: Negative for abdominal pain.   Exam: Physical Exam  Constitutional: She appears lethargic.  HENT:  Nose: No mucosal edema.  Eyes: Conjunctivae and lids are normal.  Neck: Carotid bruit is not present.  Cardiovascular: Regular rhythm, S1 normal, S2 normal and normal heart sounds.  Respiratory: She has decreased breath sounds in the right lower field and the left lower field. She has wheezes in the right lower field and the left lower field.  GI: Soft. There is no abdominal tenderness.  Musculoskeletal:     Right ankle: No swelling.     Left ankle: No swelling.  Lymphadenopathy:    She has no cervical adenopathy.  Neurological: She appears lethargic.  Patient able to follow commands and lift her arms up overhead and straight leg raise when she is awake.  Skin: Skin is warm. No rash noted.  Psychiatric:  Patient tired answered a few questions and went back to sleep      Data Reviewed: Basic Metabolic Panel: Recent Labs  Lab 05/20/19 1802 05/21/19 1437 05/22/19 0459 05/23/19 0603   NA 146* 142 142 143  K 4.1 4.0 3.8 4.1  CL 108 106 105 106  CO2 29 26 28 26   GLUCOSE 124* 154* 120* 173*  BUN 54* 46* 47* 52*  CREATININE 2.30* 1.93* 2.00* 2.25*  CALCIUM 8.7* 9.0 8.7* 8.9   Liver Function Tests: Recent Labs  Lab 05/21/19 1437  AST 44*  ALT 33  ALKPHOS 223*  BILITOT 1.4*  PROT 6.7  ALBUMIN 3.2*   CBC: Recent Labs  Lab 05/20/19 1802 05/21/19 1437 05/22/19 0459 05/23/19 0603  WBC 6.6 6.5 6.6 5.4  HGB 10.4* 10.5* 10.0* 10.3*  HCT 35.0* 35.6* 33.8* 33.5*  MCV 75.9* 76.6* 75.6* 73.1*  PLT 383 291 328 309   BNP (last 3 results) Recent Labs    04/08/19 0737 04/29/19 0718 05/21/19 1437  BNP >4,500.0* >4,500.0* >4,500.0*     CBG: Recent Labs  Lab 05/21/19 1446  GLUCAP 148*      Studies: DG Chest 2 View  Result Date: 05/21/2019 CLINICAL DATA:  Short of breath EXAM: CHEST - 2 VIEW COMPARISON:  05/20/2019 FINDINGS: Marked cardiac enlargement unchanged. Pulmonary vascular congestion unchanged. Bibasilar airspace disease left greater than right unchanged. No significant pleural effusion. IMPRESSION: Cardiac enlargement with vascular congestion unchanged. Bibasilar atelectasis/infiltrate left greater than right is unchanged. Electronically Signed   By: Franchot Gallo M.D.   On: 05/21/2019 15:31   CT HEAD WO CONTRAST  Result Date: 05/21/2019 CLINICAL DATA:  Encephalopathy EXAM: CT HEAD WITHOUT CONTRAST TECHNIQUE: Contiguous axial images were obtained from the base of the skull through the vertex without intravenous contrast. COMPARISON:  01/17/2019 FINDINGS: Brain: No acute intracranial abnormality. Specifically, no hemorrhage, hydrocephalus, mass lesion, acute infarction, or significant intracranial injury. Vascular: No hyperdense vessel or unexpected calcification. Skull: No acute calvarial abnormality. Sinuses/Orbits: Visualized paranasal sinuses and mastoids clear. Orbital soft tissues unremarkable. Other: None IMPRESSION: Normal study. Electronically  Signed   By: Rolm Baptise M.D.   On: 05/21/2019 22:58   MR BRAIN WO CONTRAST  Result Date: 05/23/2019 CLINICAL DATA:  Altered mental status of unknown etiology. Heart failure. Negative head CT 2 days ago. EXAM: MRI HEAD WITHOUT CONTRAST TECHNIQUE: Multiplanar, multiecho pulse sequences of the brain and surrounding structures were obtained without intravenous contrast. COMPARISON:  Head CT 05/21/2019.  MRI 01/17/2019. FINDINGS: Brain: Diffusion imaging does not show any acute or subacute insult affecting the brainstem, cerebellum or right hemisphere. Previously seen acute infarction at the left frontoparietal junction in the cortical and subcortical brain has lost its restricted diffusion as expected, but there is a small area of new involvement just deep to the previous acute infarction within the underlying white matter. This is consistent with minor acute to subacute extension of the previously seen insult. No new area of acute insult. Only diffusion imaging was done as requested. Vascular: Major vessels at the base of the brain show flow. Skull and upper cervical spine: Negative Sinuses/Orbits: Clear/normal Other: None IMPRESSION: In January, the patient had an area of acute/subacute infarction at the left frontoparietal junction cortical and subcortical brain. That area has lost its restricted diffusion as expected with normal evolutionary change. There is low level restricted diffusion now seen in the immediately adjacent underlying subcortical white matter, probably representing a minor extension of the insult. Electronically Signed   By: Nelson Chimes M.D.   On: 05/23/2019 11:49    Scheduled Meds: . amLODipine  5 mg Oral q morning - 10a  . aspirin EC  81 mg Oral Daily  . atorvastatin  20 mg Oral q1800  . budesonide (PULMICORT) nebulizer solution  0.5 mg Nebulization BID  . carvedilol  3.125 mg Oral BID WC  . furosemide  40 mg Intravenous BID  . hydrALAZINE  25 mg Oral Q8H  .  ipratropium-albuterol  3 mL Nebulization Q6H  . methylPREDNISolone (SOLU-MEDROL) injection  40 mg Intravenous Q12H  . rivaroxaban  15 mg Oral Daily  . sacubitril-valsartan  1 tablet Oral BID      Assessment/Plan:  1. Acute metabolic encephalopathy.  ABG does not show any CO2 retention.  MRI of the brain is abnormal.  Will get neurology evaluation.  Patient on aspirin and Xarelto anyway for anticoagulation.   2. Acute hypoxic respiratory failure.  With the patient's altered mental status she is not moving good air.  Requiring high flow nasal cannula.   3. Acute on chronic systolic congestive heart failure.  Diuresis with IV Lasix.  Continue Entresto, Coreg. 4. COPD exacerbation.  Continue steroids and nebulizer treatments.  Patient advised not to smoke crack cocaine. We will add Levaquin just in case pneumonia 5. Accelerated hypertension.  Blood pressure up and down depending on agitation.  On Coreg, Norvasc and Entresto 6. Pulmonary hypertension.  Try to reduce blood pressure. 7. Hyperlipidemia on Lipitor 8. Chronic kidney disease stage IIIb 9. History of left ventricular thrombus currently.  Cardiology placed back on Xarelto.    Code Status:     Code Status Orders  (  From admission, onward)         Start     Ordered   05/21/19 2047  Full code  Continuous     05/21/19 2056        Code Status History    Date Active Date Inactive Code Status Order ID Comments User Context   04/29/2019 0952 05/03/2019 1837 Full Code [id number]  Ivor Costa, MD ED   04/08/2019 0950 04/09/2019 1806 Full Code [id number]  Collier Bullock, MD ED   03/24/2019 2343 03/28/2019 1724 Full Code [id number]  Mansy, Arvella Merles, MD ED   03/06/2019 1425 03/10/2019 1658 Full Code [id number]  Ivor Costa, MD Inpatient   02/13/2019 1508 02/16/2019 1846 Full Code [id number]  Max Sane, MD ED   01/31/2019 0815 02/04/2019 1748 Full Code [id number]  Para Skeans, MD ED   01/31/2019 0814 01/31/2019 0815 Full Code [id number]  Para Skeans, MD ED    01/18/2019 0038 01/21/2019 1923 DNR [id number]  Lenore Cordia, MD ED   12/29/2018 0449 01/01/2019 2033 Full Code [id number]  Mansy, Arvella Merles, MD ED   Advance Care Planning Activity     Family Communication: Refuse me calling any family Disposition Plan: Status is: Inpatient  Dispo: The patient is from: Home              Anticipated d/c is to: Home              Anticipated d/c date is: 05/26/2019              Patient currently wheezing and short of breath.  With the patient's acute metabolic encephalopathy and abnormal MRI of the brain she is requiring high flow nasal cannula in order to move air and oxygenate.  This will likely lengthen her hospital course.  Consultants:  Cardiology  Time spent: 27 minutes.  Case discussed with neurology  [facility]  [facility]

## 2019-05-23 NOTE — Progress Notes (Signed)
PT Cancellation Note  Patient Details Name: Katelyn Lane MRN: [id number] DOB: August 07, 1955   Cancelled Treatment:    Reason Eval/Treat Not Completed: Patient's level of consciousness Attempted to see pt this AM, spoke with nurse before hand who is in agreement in trying despite pt having been very sleepy (did have Haldol this AM).  Pt sleeping soundly on arrival, briefly opens eyes when PT loudly called her name and attempted to engage in conversation, however pt unable to keep eyes open much less answer even a single question.  PT exam deferred this AM, will try back later as time allows and pt is appropriate.   Kreg Shropshire, DPT 05/23/2019, 1:03 PM

## 2019-05-24 LAB — EXPECTORATED SPUTUM ASSESSMENT W GRAM STAIN, RFLX TO RESP C

## 2019-05-24 LAB — BASIC METABOLIC PANEL
Anion gap: 10 (ref 5–15)
BUN: 57 mg/dL — ABNORMAL HIGH (ref 8–23)
CO2: 26 mmol/L (ref 22–32)
Calcium: 8.7 mg/dL — ABNORMAL LOW (ref 8.9–10.3)
Chloride: 107 mmol/L (ref 98–111)
Creatinine, Ser: 2.19 mg/dL — ABNORMAL HIGH (ref 0.44–1.00)
GFR calc Af Amer: 27 mL/min — ABNORMAL LOW (ref >60)
GFR calc non Af Amer: 23 mL/min — ABNORMAL LOW (ref >60)
Glucose, Bld: 185 mg/dL — ABNORMAL HIGH (ref 70–99)
Potassium: 4.3 mmol/L (ref 3.5–5.1)
Sodium: 143 mmol/L (ref 135–145)

## 2019-05-24 LAB — AMMONIA: Ammonia: 30 umol/L (ref 9–35)

## 2019-05-24 LAB — TSH: TSH: 4.35 u[IU]/mL (ref 0.350–4.500)

## 2019-05-24 MED ORDER — HYDROCOD POLST-CPM POLST ER 10-8 MG/5ML PO SUER
5.0000 mL | Freq: Two times a day (BID) | ORAL | Status: DC | PRN
  Administered 2019-05-24 – 2019-05-26 (×3): 5 mL via ORAL
  Filled 2019-05-24 (×3): qty 5

## 2019-05-24 NOTE — Plan of Care (Signed)
  Problem: Clinical Measurements: Goal: Respiratory complications will improve Outcome: Progressing  Off of HFNC Problem: Safety: Goal: Ability to remain free from injury will improve Outcome: Not Progressing  Pt has fallen recently, but refusing bed alarm

## 2019-05-24 NOTE — Progress Notes (Signed)
Subjective: Significantly more awake today and following commands.   Objective: Current vital signs: BP (!) 126/98 (BP Location: Left Arm)   Pulse 66   Temp 98.1 F (36.7 C)   Resp 16   Ht 5\' 9"  (1.753 m)   Wt 97.8 kg   SpO2 100%   BMI 31.84 kg/m  Vital signs in last 24 hours: Temp:  [96 F (35.6 C)-98.4 F (36.9 C)] 98.1 F (36.7 C) (05/16 1154) Pulse Rate:  [66-78] 66 (05/16 1154) Resp:  [16-23] 16 (05/16 1154) BP: (117-149)/(88-99) 126/98 (05/16 1154) SpO2:  [95 %-100 %] 100 % (05/16 1154) FiO2 (%):  [32 %-35 %] 35 % (05/15 2039) Weight:  [97.8 kg] 97.8 kg (05/16 0413)  Intake/Output from previous day: 05/15 0701 - 05/16 0700 In: 118 [P.O.:118] Out: 200 [Urine:200] Intake/Output this shift: No intake/output data recorded. Nutritional status:  Diet Order            Diet 2 gram sodium Room service appropriate? Yes; Fluid consistency: Thin  Diet effective now              Neurologic Exam:  Neurological Examination   Mental Status: Alert, oriented, thought content appropriate.   Cranial Nerves: II: Discs flat bilaterally; Visual fields grossly normal, pupils equal, round, reactive to light and accommodation III,IV, VI: ptosis not present, extra-ocular motions intact bilaterally V,VII: smile symmetric, facial light touch sensation normal bilaterally VIII: hearing normal bilaterally IX,X: gag reflex present XI: bilateral shoulder shrug XII: midline tongue extension Motor: Right : Upper extremity   5/5    Left:     Upper extremity   5/5  Lower extremity   4/5     Lower extremity   4/5 Tone and bulk:normal tone throughout; no atrophy noted Sensory: Pinprick and light touch intact throughout, bilaterally Deep Tendon Reflexes: 1+ and symmetric throughout Plantars: Right: downgoing   Left: downgoing Cerebellar: normal finger-to-nose      Lab Results: Results for orders placed or performed during the hospital encounter of 05/21/19 (from the past 48  hour(s))  Basic metabolic panel     Status: Abnormal   Collection Time: 05/23/19  6:03 AM  Result Value Ref Range   Sodium 143 135 - 145 mmol/L   Potassium 4.1 3.5 - 5.1 mmol/L   Chloride 106 98 - 111 mmol/L   CO2 26 22 - 32 mmol/L   Glucose, Bld 173 (H) 70 - 99 mg/dL    Comment: Glucose reference range applies only to samples taken after fasting for at least 8 hours.   BUN 52 (H) 8 - 23 mg/dL   Creatinine, Ser 2.25 (H) 0.44 - 1.00 mg/dL   Calcium 8.9 8.9 - 10.3 mg/dL   GFR calc non Af Amer 22 (L) >60 mL/min   GFR calc Af Amer 26 (L) >60 mL/min   Anion gap 11 5 - 15    Comment: Performed at [facility], [place], [place] [address]  Heparin level (unfractionated)     Status: Abnormal   Collection Time: 05/23/19  6:03 AM  Result Value Ref Range   Heparin Unfractionated 0.74 (H) 0.30 - 0.70 IU/mL    Comment: (NOTE) If heparin results are below expected values, and patient dosage has  been confirmed, suggest follow up testing of antithrombin III levels. Performed at [facility], [address] [place], [place] [address]   APTT     Status: Abnormal   Collection Time: 05/23/19  6:03 AM  Result  Value Ref Range   aPTT 131 (H) 24 - 36 seconds    Comment:        IF BASELINE aPTT IS ELEVATED, SUGGEST PATIENT RISK ASSESSMENT BE USED TO DETERMINE APPROPRIATE ANTICOAGULANT THERAPY. Performed at [facility], [address] [place], [place] [address]   CBC     Status: Abnormal   Collection Time: 05/23/19  6:03 AM  Result Value Ref Range   WBC 5.4 4.0 - 10.5 K/uL   RBC 4.58 3.87 - 5.11 MIL/uL   Hemoglobin 10.3 (L) 12.0 - 15.0 g/dL   HCT 33.5 (L) 36.0 - 46.0 %   MCV 73.1 (L) 80.0 - 100.0 fL   MCH 22.5 (L) 26.0 - 34.0 pg   MCHC 30.7 30.0 - 36.0 g/dL   RDW 20.5 (H) 11.5 - 15.5 %   Platelets 309 150 - 400 K/uL   nRBC 0.0 0.0 - 0.2 %    Comment: Performed at [facility], [address] [place], [place] [address]  Expectorated  sputum assessment w rflx to resp cult     Status: None   Collection Time: 05/23/19 10:53 AM   Specimen: Sputum  Result Value Ref Range   Specimen Description SPUTUM    Special Requests NONE    Sputum evaluation      THIS SPECIMEN IS ACCEPTABLE FOR SPUTUM CULTURE Performed at [facility], [address] [place], [place] [address]    Report Status 05/24/2019 FINAL   Blood gas, arterial     Status: Abnormal   Collection Time: 05/23/19 11:48 AM  Result Value Ref Range   FIO2 0.36    O2 Content 4.0 L/min   Delivery systems NASAL CANNULA    pH, Arterial 7.42 7.350 - 7.450   pCO2 arterial 43 32.0 - 48.0 mmHg   pO2, Arterial 150 (H) 83.0 - 108.0 mmHg   Bicarbonate 27.9 20.0 - 28.0 mmol/L   Acid-Base Excess 2.9 (H) 0.0 - 2.0 mmol/L   O2 Saturation 99.3 %   Patient temperature 37.0    Collection site LEFT RADIAL    Sample type ARTERIAL DRAW     Comment: Performed at [facility], [address] [place], [place] [address]  Basic metabolic panel     Status: Abnormal   Collection Time: 05/24/19  4:48 AM  Result Value Ref Range   Sodium 143 135 - 145 mmol/L   Potassium 4.3 3.5 - 5.1 mmol/L   Chloride 107 98 - 111 mmol/L   CO2 26 22 - 32 mmol/L   Glucose, Bld 185 (H) 70 - 99 mg/dL    Comment: Glucose reference range applies only to samples taken after fasting for at least 8 hours.   BUN 57 (H) 8 - 23 mg/dL   Creatinine, Ser 2.19 (H) 0.44 - 1.00 mg/dL   Calcium 8.7 (L) 8.9 - 10.3 mg/dL   GFR calc non Af Amer 23 (L) >60 mL/min   GFR calc Af Amer 27 (L) >60 mL/min   Anion gap 10 5 - 15    Comment: Performed at [facility], [address] [place], [place] [address]  Ammonia     Status: None   Collection Time: 05/24/19  4:48 AM  Result Value Ref Range   Ammonia 30 9 - 35 umol/L    Comment: Performed at [facility], [address] [place], [place] [address]  TSH     Status: None   Collection Time: 05/24/19  4:48 AM  Result Value Ref Range    TSH 4.350 0.350 - 4.500 uIU/mL    Comment: Performed by a 3rd Generation assay with a functional sensitivity of <=0.01 uIU/mL. Performed at [facility], [address] [place], [place] [address]     Recent Results (from the past 240 hour(s))  Expectorated sputum assessment w rflx to resp cult     Status: None   Collection Time: 05/23/19 10:53 AM   Specimen: Sputum  Result Value Ref Range Status   Specimen Description SPUTUM  Final   Special Requests NONE  Final   Sputum evaluation   Final    THIS SPECIMEN IS ACCEPTABLE FOR SPUTUM CULTURE Performed at [facility], [address] [place], [place] [address]    Report Status 05/24/2019 FINAL  Final    Lipid Panel Recent Labs    05/22/19 0459  CHOL 100  TRIG 56  HDL 46  CHOLHDL 2.2  VLDL 11  LDLCALC 43    Studies/Results: MR BRAIN WO CONTRAST  Result Date: 05/23/2019 CLINICAL DATA:  Altered mental status of unknown etiology. Heart failure. Negative head CT 2 days ago. EXAM: MRI HEAD WITHOUT CONTRAST TECHNIQUE: Multiplanar, multiecho pulse sequences of the brain and surrounding structures were obtained without intravenous contrast. COMPARISON:  Head CT 05/21/2019.  MRI 01/17/2019. FINDINGS: Brain: Diffusion imaging does not show any acute or subacute insult affecting the brainstem, cerebellum or right hemisphere. Previously seen acute infarction at the left frontoparietal junction in the cortical and subcortical brain has lost its restricted diffusion as expected, but there is a small area of new involvement just deep to the previous acute infarction within the underlying white matter. This is consistent with minor acute to subacute extension of the previously seen insult. No new area of acute insult. Only diffusion imaging was done as requested. Vascular: Major vessels at the base of the brain show flow. Skull and upper cervical spine: Negative Sinuses/Orbits: Clear/normal Other: None IMPRESSION: In January, the  patient had an area of acute/subacute infarction at the left frontoparietal junction cortical and subcortical brain. That area has lost its restricted diffusion as expected with normal evolutionary change. There is low level restricted diffusion now seen in the immediately adjacent underlying subcortical white matter, probably representing a minor extension of the insult. Electronically Signed   By: Nelson Chimes M.D.   On: 05/23/2019 11:49    Medications: I have reviewed the patient's current medications.  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   - likely episode of delirium - significant improvement - extensions of chronic stroke - no further imaging from neurological stand ponit

## 2019-05-24 NOTE — Progress Notes (Signed)
Patient ID: Katelyn Lane, female   DOB: 27-Sep-1955, 64 y.o.   MRN: [id number] [facility] PROGRESS NOTE  Sofi Bryars NTI:[id number] DOB: 10/22/1955 DOA: 05/21/2019 PCP: [name], MD  HPI/Subjective: Patient was more alert today and able to answer questions.  Did not remember seeing me over the last 3 days that have been rounding.  Still has some shortness of breath some cough and wheeze.  No complaints of chest pain  Objective: Vitals:   05/24/19 0830 05/24/19 1154  BP:  (!) 126/98  Pulse: 73 66  Resp: (!) 23 16  Temp:  98.1 F (36.7 C)  SpO2: 99% 100%    Intake/Output Summary (Last 24 hours) at 05/24/2019 1507 Last data filed at 05/24/2019 8676 Gross per 24 hour  Intake 118 ml  Output 200 ml  Net -82 ml   Filed Weights   05/22/19 0800 05/23/19 0418 05/24/19 0413  Weight: 99.7 kg 100.6 kg 97.8 kg    ROS: Review of Systems  Constitutional: Negative for fever.  Eyes: Negative for blurred vision.  Respiratory: Positive for cough, shortness of breath and wheezing.   Cardiovascular: Negative for chest pain.  Gastrointestinal: Negative for abdominal pain, constipation, diarrhea, nausea and vomiting.  Genitourinary: Negative for dysuria.  Musculoskeletal: Negative for joint pain.  Neurological: Negative for dizziness.   Exam: Physical Exam  HENT:  Nose: No mucosal edema.  Mouth/Throat: No oropharyngeal exudate or posterior oropharyngeal edema.  Eyes: Conjunctivae and lids are normal.  Neck: Carotid bruit is not present.  Cardiovascular: S1 normal and S2 normal. Exam reveals no gallop.  Murmur heard.  Systolic murmur is present with a grade of 2/6. Pulses:      Dorsalis pedis pulses are 2+ on the right side and 2+ on the left side.  Respiratory: No respiratory distress. She has decreased breath sounds in the right middle field, the right lower field, the left middle field and the left lower field. She has wheezes in the right middle field and the  left middle field. She has rhonchi in the right lower field and the left lower field. She has no rales.  GI: Soft. Bowel sounds are normal. There is no abdominal tenderness.  Musculoskeletal:     Right ankle: No swelling.     Left ankle: No swelling.  Lymphadenopathy:    She has no cervical adenopathy.  Neurological: She is alert. No cranial nerve deficit.  Skin: Skin is warm. No rash noted. Nails show no clubbing.  Psychiatric: She has a normal mood and affect.      Data Reviewed: Basic Metabolic Panel: Recent Labs  Lab 05/20/19 1802 05/21/19 1437 05/22/19 0459 05/23/19 0603 05/24/19 0448  NA 146* 142 142 143 143  K 4.1 4.0 3.8 4.1 4.3  CL 108 106 105 106 107  CO2 29 26 28 26 26   GLUCOSE 124* 154* 120* 173* 185*  BUN 54* 46* 47* 52* 57*  CREATININE 2.30* 1.93* 2.00* 2.25* 2.19*  CALCIUM 8.7* 9.0 8.7* 8.9 8.7*   Liver Function Tests: Recent Labs  Lab 05/21/19 1437  AST 44*  ALT 33  ALKPHOS 223*  BILITOT 1.4*  PROT 6.7  ALBUMIN 3.2*    Recent Labs  Lab 05/24/19 0448  AMMONIA 30   CBC: Recent Labs  Lab 05/20/19 1802 05/21/19 1437 05/22/19 0459 05/23/19 0603  WBC 6.6 6.5 6.6 5.4  HGB 10.4* 10.5* 10.0* 10.3*  HCT 35.0* 35.6* 33.8* 33.5*  MCV 75.9* 76.6* 75.6* 73.1*  PLT 383 291 328 309  BNP (last 3 results) Recent Labs    04/08/19 0737 04/29/19 0718 05/21/19 1437  BNP >4,500.0* >4,500.0* >4,500.0*    CBG: Recent Labs  Lab 05/21/19 1446  GLUCAP 148*    Recent Results (from the past 240 hour(s))  Expectorated sputum assessment w rflx to resp cult     Status: None   Collection Time: 05/23/19 10:53 AM   Specimen: Sputum  Result Value Ref Range Status   Specimen Description SPUTUM  Final   Special Requests NONE  Final   Sputum evaluation   Final    THIS SPECIMEN IS ACCEPTABLE FOR SPUTUM CULTURE Performed at [facility], [address] [place], [place] [address]    Report Status 05/24/2019 FINAL  Final     Studies: MR  BRAIN WO CONTRAST  Result Date: 05/23/2019 CLINICAL DATA:  Altered mental status of unknown etiology. Heart failure. Negative head CT 2 days ago. EXAM: MRI HEAD WITHOUT CONTRAST TECHNIQUE: Multiplanar, multiecho pulse sequences of the brain and surrounding structures were obtained without intravenous contrast. COMPARISON:  Head CT 05/21/2019.  MRI 01/17/2019. FINDINGS: Brain: Diffusion imaging does not show any acute or subacute insult affecting the brainstem, cerebellum or right hemisphere. Previously seen acute infarction at the left frontoparietal junction in the cortical and subcortical brain has lost its restricted diffusion as expected, but there is a small area of new involvement just deep to the previous acute infarction within the underlying white matter. This is consistent with minor acute to subacute extension of the previously seen insult. No new area of acute insult. Only diffusion imaging was done as requested. Vascular: Major vessels at the base of the brain show flow. Skull and upper cervical spine: Negative Sinuses/Orbits: Clear/normal Other: None IMPRESSION: In January, the patient had an area of acute/subacute infarction at the left frontoparietal junction cortical and subcortical brain. That area has lost its restricted diffusion as expected with normal evolutionary change. There is low level restricted diffusion now seen in the immediately adjacent underlying subcortical white matter, probably representing a minor extension of the insult. Electronically Signed   By: Nelson Chimes M.D.   On: 05/23/2019 11:49    Scheduled Meds: . amLODipine  5 mg Oral q morning - 10a  . aspirin EC  81 mg Oral Daily  . atorvastatin  20 mg Oral q1800  . budesonide (PULMICORT) nebulizer solution  0.5 mg Nebulization BID  . carvedilol  3.125 mg Oral BID WC  . furosemide  40 mg Intravenous BID  . hydrALAZINE  25 mg Oral Q8H  . ipratropium-albuterol  3 mL Nebulization Q6H  . methylPREDNISolone (SOLU-MEDROL)  injection  40 mg Intravenous Q12H  . rivaroxaban  15 mg Oral Daily  . sacubitril-valsartan  1 tablet Oral BID   Continuous Infusions: . azithromycin 500 mg (05/23/19 1646)  . cefTRIAXone (ROCEPHIN)  IV 2 g (05/23/19 1646)    Assessment/Plan:  1. Acute metabolic encephalopathy.  This has improved over the last 3 days. 2. Acute hypoxic respiratory failure.  The patient able to move better air and come off the oxygen now that she is more alert. 3. Acute on chronic systolic congestive heart failure.  Diuresis with IV Lasix.  Continue Entresto and Coreg 4. COPD exacerbation.  Continue steroids and nebulizer treatments.  Rocephin and Zithromax added yesterday 5. Accelerated hypertension.  Blood pressure up and down depending on agitation or not.  Hopefully blood pressure trend better on Coreg Norvasc and Entresto 6. Pulmonary hypertension 7. Hyperlipidemia on Lipitor 8. Chronic  kidney disease stage IIIb 9. History of left ventricular thrombus was initially on heparin drip and now back on Xarelto     Code Status:     Code Status Orders  (From admission, onward)         Start     Ordered   05/21/19 2047  Full code  Continuous     05/21/19 2056        Code Status History    Date Active Date Inactive Code Status Order ID Comments User Context   04/29/2019 0952 05/03/2019 1837 Full Code [id number]  Ivor Costa, MD ED   04/08/2019 0950 04/09/2019 1806 Full Code [id number]  Collier Bullock, MD ED   03/24/2019 2343 03/28/2019 1724 Full Code [id number]  Mansy, Arvella Merles, MD ED   03/06/2019 1425 03/10/2019 1658 Full Code [id number]  Ivor Costa, MD Inpatient   02/13/2019 1508 02/16/2019 1846 Full Code [id number]  Max Sane, MD ED   01/31/2019 0815 02/04/2019 1748 Full Code [id number]  Para Skeans, MD ED   01/31/2019 0814 01/31/2019 0815 Full Code [id number]  Para Skeans, MD ED   01/18/2019 0038 01/21/2019 1923 DNR [id number]  Lenore Cordia, MD ED   12/29/2018 0449 01/01/2019 2033 Full Code [id number]  Mansy,  Arvella Merles, MD ED   Advance Care Planning Activity     Family Communication: Patient refused Disposition Plan: Status is: Inpatient  Dispo: The patient is from: Home              Anticipated d/c is to: I was notified by the physical therapist that the patient is getting evicted.               Anticipated d/c date is: Potentially 05/26/2019.  We will have to touch base with transitional care team tomorrow about plan              Patient currently improved than when she came in.  Still wheezing and still have work to do with getting rid of inflammation and treating infection.  Will have to have a place to go upon disposition.  Consultants:  Neurology  Cardiology  Antibiotics:  Rocephin  Zithromax  Time spent: 28 minutes  [place]

## 2019-05-24 NOTE — Evaluation (Signed)
Physical Therapy Evaluation Patient Details Name: Katelyn Lane MRN: [id number] DOB: 08-07-1955 Today's Date: 05/24/2019   History of Present Illness  64 y.o. 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  Missed follow-up appointment and is now back with the same.  Clinical Impression  Pt sleeping on arrival and though she was easy enough to wake and initially was not too interested in interacting, but with mild cues to warm to the idea of working with PT and trying to walk.  She ultimately did relatively well, though today she did want to use a walker (did not need/want one when she was here ~3 weeks ago).  She was also feeling weaker and less steady than her baseline and did like the idea of having a PT come out of the home for a while to help get back to her independent and active baseline.  On arrival pt on 3L O2 with sats in the high 90s, per pt request we did the remainder of the session without supplemental O2 and her sats stayed in the 90s t/o, 95% after circumambulation of the nurses' station.     Follow Up Recommendations Home health PT    Equipment Recommendations  Rolling walker with 5" wheels    Recommendations for Other Services       Precautions / Restrictions Precautions Precautions: Fall Restrictions Weight Bearing Restrictions: No      Mobility  Bed Mobility Overal bed mobility: Independent             General bed mobility comments: Pt able to get herself up to sitting EOB w/o assist  Transfers Overall transfer level: Modified independent Equipment used: Rolling walker (2 wheeled)              General transfer comment: Pt with some initialy unsteadiness, felt more comfortable (and was more steady) with walker to maintain balance  Ambulation/Gait Ambulation/Gait assistance: Supervision Gait Distance (Feet): 200 Feet Assistive device: Rolling walker (2 wheeled)       General Gait Details: Pt was able to take a few steps in the room without AD, but was steadier (and reports feeling much safer) with AD to steady herself.  Stairs            Wheelchair Mobility    Modified Rankin (Stroke Patients Only)       Balance Overall balance assessment: Mild deficits observed, not formally tested                                           Pertinent Vitals/Pain      Home Living Family/patient expects to be discharged to:: Unsure Living Arrangements: Alone     Home Access: Stairs to enter Entrance Stairs-Rails: Can reach both;Right;Left Entrance Stairs-Number of Steps: 2 Home Layout: One level   Additional Comments: pt reports that she is going to be evicted in 9 days, reports not where to go    Prior Function Level of Independence: Independent         Comments: pt drives, runs errands, reports staying relatively active     Hand Dominance  Extremity/Trunk Assessment   Upper Extremity Assessment Upper Extremity Assessment: Overall WFL for tasks assessed    Lower Extremity Assessment Lower Extremity Assessment: Overall WFL for tasks assessed       Communication   Communication: No difficulties  Cognition Arousal/Alertness: Awake/alert Behavior During Therapy: Impulsive Overall Cognitive Status: Within Functional Limits for tasks assessed                                 General Comments: Pt at time with curt interactions      General Comments      Exercises     Assessment/Plan    PT Assessment Patient needs continued PT services  PT Problem List Decreased strength;Decreased activity  tolerance;Decreased mobility;Cardiopulmonary status limiting activity       PT Treatment Interventions DME instruction;Gait training;Stair training;Functional mobility training;Therapeutic activities;Therapeutic exercise;Balance training;Patient/family education    PT Goals (Current goals can be found in the Care Plan section)  Acute Rehab PT Goals Patient Stated Goal: figure out where she can live PT Goal Formulation: With patient Time For Goal Achievement: 06/07/19 Potential to Achieve Goals: Good    Frequency Min 2X/week   Barriers to discharge        Co-evaluation               AM-PAC PT "6 Clicks" Mobility  Outcome Measure Help needed turning from your back to your side while in a flat bed without using bedrails?: None Help needed moving from lying on your back to sitting on the side of a flat bed without using bedrails?: None Help needed moving to and from a bed to a chair (including a wheelchair)?: None Help needed standing up from a chair using your arms (e.g., wheelchair or bedside chair)?: None Help needed to walk in hospital room?: A Little Help needed climbing 3-5 steps with a railing? : A Little 6 Click Score: 22    End of Session Equipment Utilized During Treatment: Gait belt(on O2 on arrival requests to take it off sats remain 90s t/o) Activity Tolerance: Patient tolerated treatment well Patient left: with chair alarm set;with call bell/phone within reach Nurse Communication: Mobility status PT Visit Diagnosis: Other abnormalities of gait and mobility (R26.89);Muscle weakness (generalized) (M62.81)    Time: 1610-9604 PT Time Calculation (min) (ACUTE ONLY): 30 min   Charges:   PT Evaluation $PT Eval Low Complexity: 1 Low PT Treatments $Gait Training: 8-22 mins        Kreg Shropshire, DPT 05/24/2019, 1:43 PM

## 2019-05-24 NOTE — Consult Note (Signed)
[facility] Cardiology    SUBJECTIVE: Patient feels somewhat better improved shortness of breath improved leg edema denies any chest pain   Vitals:   05/24/19 1154 05/24/19 1530 05/24/19 1615 05/24/19 1952  BP: (!) 126/98 (!) 139/102 (!) 140/95 (!) 114/97  Pulse: 66 63  73  Resp: 16 16  18   Temp: 98.1 F (36.7 C) 98.3 F (36.8 C)  97.6 F (36.4 C)  TempSrc:      SpO2: 100% 99%  100%  Weight:      Height:         Intake/Output Summary (Last 24 hours) at 05/24/2019 2245 Last data filed at 05/24/2019 1700 Gross per 24 hour  Intake 478 ml  Output 200 ml  Net 278 ml      PHYSICAL EXAM  General: Well developed, well nourished, in no acute distress HEENT:  Normocephalic and atramatic Neck:  No JVD.  Lungs: Clear bilaterally to auscultation and percussion. Heart: HRRR . Normal S1 and S2 without gallops or murmurs.  Abdomen: Bowel sounds are positive, abdomen soft and non-tender  Msk:  Back normal, normal gait. Normal strength and tone for age. Extremities: No clubbing, cyanosis or edema.   Neuro: Alert and oriented X 3. Psych:  Good affect, responds appropriately   LABS: Basic Metabolic Panel: Recent Labs    05/23/19 0603 05/24/19 0448  NA 143 143  K 4.1 4.3  CL 106 107  CO2 26 26  GLUCOSE 173* 185*  BUN 52* 57*  CREATININE 2.25* 2.19*  CALCIUM 8.9 8.7*   Liver Function Tests: No results for input(s): AST, ALT, ALKPHOS, BILITOT, PROT, ALBUMIN in the last 72 hours. No results for input(s): LIPASE, AMYLASE in the last 72 hours. CBC: Recent Labs    05/22/19 0459 05/23/19 0603  WBC 6.6 5.4  HGB 10.0* 10.3*  HCT 33.8* 33.5*  MCV 75.6* 73.1*  PLT 328 309   Cardiac Enzymes: No results for input(s): CKTOTAL, CKMB, CKMBINDEX, TROPONINI in the last 72 hours. BNP: Invalid input(s): POCBNP D-Dimer: No results for input(s): DDIMER in the last 72 hours. Hemoglobin A1C: No results for input(s): HGBA1C in the last 72 hours. Fasting Lipid Panel: Recent Labs   05/22/19 0459  CHOL 100  HDL 46  LDLCALC 43  TRIG 56  CHOLHDL 2.2   Thyroid Function Tests: Recent Labs    05/24/19 0448  TSH 4.350   Anemia Panel: No results for input(s): VITAMINB12, FOLATE, FERRITIN, TIBC, IRON, RETICCTPCT in the last 72 hours.  MR BRAIN WO CONTRAST  Result Date: 05/23/2019 CLINICAL DATA:  Altered mental status of unknown etiology. Heart failure. Negative head CT 2 days ago. EXAM: MRI HEAD WITHOUT CONTRAST TECHNIQUE: Multiplanar, multiecho pulse sequences of the brain and surrounding structures were obtained without intravenous contrast. COMPARISON:  Head CT 05/21/2019.  MRI 01/17/2019. FINDINGS: Brain: Diffusion imaging does not show any acute or subacute insult affecting the brainstem, cerebellum or right hemisphere. Previously seen acute infarction at the left frontoparietal junction in the cortical and subcortical brain has lost its restricted diffusion as expected, but there is a small area of new involvement just deep to the previous acute infarction within the underlying white matter. This is consistent with minor acute to subacute extension of the previously seen insult. No new area of acute insult. Only diffusion imaging was done as requested. Vascular: Major vessels at the base of the brain show flow. Skull and upper cervical spine: Negative Sinuses/Orbits: Clear/normal Other: None IMPRESSION: In January, the patient had an area  of acute/subacute infarction at the left frontoparietal junction cortical and subcortical brain. That area has lost its restricted diffusion as expected with normal evolutionary change. There is low level restricted diffusion now seen in the immediately adjacent underlying subcortical white matter, probably representing a minor extension of the insult. Electronically Signed   By: Nelson Chimes M.D.   On: 05/23/2019 11:49     Echo severely depressed left ventricular function ejection fraction less than 25%  TELEMETRY: Normal sinus rhythm  rate of 70 left bundle branch block  ASSESSMENT AND PLAN:  Principal Problem:   Acute on chronic combined systolic and diastolic CHF (congestive heart failure) (HCC) Active Problems:   Dilated cardiomyopathy (HCC)   Elevated troponin   Hyperlipidemia   Crack cocaine use   LV (left ventricular) mural thrombus   Chronic renal failure, stage 3b   Acute systolic CHF (congestive heart failure) (HCC)   Acute metabolic encephalopathy   History of substance abuse (HCC)   CHF (congestive heart failure) (HCC)   COPD with acute exacerbation (HCC)   Pulmonary hypertension (HCC)    PLAN Congestive heart failure systolic dysfunction continue aggressive medical therapy Dilated cardiomyopathy appears to be nonischemic continue current therapy including diuretics Left ventricular thrombus currently on Xarelto therapy for anticoagulation continue current care COPD mild to moderate continue inhalers Follow-up with pulmonary Pulmonary hypertension by history follow-up with pulmonary Obesity mild recommend modest weight loss exercise portion control   Yolonda Kida, MD 05/24/2019 10:45 PM

## 2019-05-25 DIAGNOSIS — I4729 Other ventricular tachycardia: Secondary | ICD-10-CM

## 2019-05-25 DIAGNOSIS — I10 Essential (primary) hypertension: Secondary | ICD-10-CM

## 2019-05-25 DIAGNOSIS — I472 Ventricular tachycardia: Secondary | ICD-10-CM

## 2019-05-25 LAB — BASIC METABOLIC PANEL
Anion gap: 10 (ref 5–15)
BUN: 60 mg/dL — ABNORMAL HIGH (ref 8–23)
CO2: 27 mmol/L (ref 22–32)
Calcium: 9 mg/dL (ref 8.9–10.3)
Chloride: 105 mmol/L (ref 98–111)
Creatinine, Ser: 2.11 mg/dL — ABNORMAL HIGH (ref 0.44–1.00)
GFR calc Af Amer: 28 mL/min — ABNORMAL LOW (ref >60)
GFR calc non Af Amer: 24 mL/min — ABNORMAL LOW (ref >60)
Glucose, Bld: 187 mg/dL — ABNORMAL HIGH (ref 70–99)
Potassium: 4.3 mmol/L (ref 3.5–5.1)
Sodium: 142 mmol/L (ref 135–145)

## 2019-05-25 MED ORDER — MAGNESIUM SULFATE 2 GM/50ML IV SOLN
2.0000 g | Freq: Once | INTRAVENOUS | Status: AC
  Administered 2019-05-25: 2 g via INTRAVENOUS
  Filled 2019-05-25: qty 50

## 2019-05-25 MED ORDER — AZITHROMYCIN 250 MG PO TABS
500.0000 mg | ORAL_TABLET | Freq: Every day | ORAL | Status: DC
  Administered 2019-05-25 – 2019-05-26 (×2): 500 mg via ORAL
  Filled 2019-05-25 (×2): qty 2

## 2019-05-25 MED ORDER — RIVAROXABAN 20 MG PO TABS
20.0000 mg | ORAL_TABLET | Freq: Every day | ORAL | Status: DC
  Administered 2019-05-26: 20 mg via ORAL
  Filled 2019-05-25: qty 1

## 2019-05-25 MED ORDER — SODIUM CHLORIDE 0.9% FLUSH
10.0000 mL | Freq: Two times a day (BID) | INTRAVENOUS | Status: DC
  Administered 2019-05-25 – 2019-05-26 (×2): 10 mL via INTRAVENOUS

## 2019-05-25 NOTE — Progress Notes (Signed)
Pt refused breathing tx this am

## 2019-05-25 NOTE — Progress Notes (Signed)
Pt refused 1400 nebulizer

## 2019-05-25 NOTE — Progress Notes (Signed)
Patient ID: Katelyn Lane, female   DOB: 1955-06-15, 64 y.o.   MRN: [id number] [facility] PROGRESS NOTE  Katelyn Lane FVC:[id number] DOB: 1955/12/16 DOA: 05/21/2019 PCP: Katelyn Burrow, MD  HPI/Subjective: Patient awakened from sleep.  Having some cough shortness of breath.  Feeling a little bit better.  Was placed on oxygen.  Objective: Vitals:   05/25/19 0809 05/25/19 1138  BP: 117/86 (!) 124/94  Pulse: 73 70  Resp: 17 18  Temp: 98.3 F (36.8 C) 98.3 F (36.8 C)  SpO2: 99% 100%    Intake/Output Summary (Last 24 hours) at 05/25/2019 1557 Last data filed at 05/25/2019 1000 Gross per 24 hour  Intake 480 ml  Output 0 ml  Net 480 ml   Filed Weights   05/23/19 0418 05/24/19 0413 05/25/19 0510  Weight: 100.6 kg 97.8 kg 101.2 kg    ROS: Review of Systems  Constitutional: Negative for fever.  Eyes: Negative for blurred vision.  Respiratory: Positive for cough and shortness of breath.   Cardiovascular: Negative for chest pain.  Gastrointestinal: Negative for abdominal pain, nausea and vomiting.  Genitourinary: Negative for dysuria.  Musculoskeletal: Negative for joint pain.  Neurological: Negative for dizziness.   Exam: Physical Exam  HENT:  Nose: No mucosal edema.  Mouth/Throat: No oropharyngeal exudate or posterior oropharyngeal edema.  Eyes: Conjunctivae and lids are normal.  Neck: Carotid bruit is not present.  Cardiovascular: S1 normal and S2 normal. Exam reveals no gallop.  Murmur heard.  Systolic murmur is present with a grade of 2/6. Pulses:      Dorsalis pedis pulses are 2+ on the right side and 2+ on the left side.  Respiratory: No respiratory distress. She has decreased breath sounds in the right lower field and the left lower field. She has wheezes in the right middle field and the left middle field. She has rhonchi in the right lower field and the left lower field. She has no rales.  GI: Soft. Bowel sounds are normal. There is no abdominal  tenderness.  Musculoskeletal:     Right ankle: No swelling.     Left ankle: No swelling.  Lymphadenopathy:    She has no cervical adenopathy.  Neurological: She is alert. No cranial nerve deficit.  Skin: Skin is warm. No rash noted. Nails show no clubbing.  Psychiatric: She has a normal mood and affect.      Data Reviewed: Basic Metabolic Panel: Recent Labs  Lab 05/21/19 1437 05/22/19 0459 05/23/19 0603 05/24/19 0448 05/25/19 0408  NA 142 142 143 143 142  K 4.0 3.8 4.1 4.3 4.3  CL 106 105 106 107 105  CO2 26 28 26 26 27   GLUCOSE 154* 120* 173* 185* 187*  BUN 46* 47* 52* 57* 60*  CREATININE 1.93* 2.00* 2.25* 2.19* 2.11*  CALCIUM 9.0 8.7* 8.9 8.7* 9.0   Liver Function Tests: Recent Labs  Lab 05/21/19 1437  AST 44*  ALT 33  ALKPHOS 223*  BILITOT 1.4*  PROT 6.7  ALBUMIN 3.2*    Recent Labs  Lab 05/24/19 0448  AMMONIA 30   CBC: Recent Labs  Lab 05/20/19 1802 05/21/19 1437 05/22/19 0459 05/23/19 0603  WBC 6.6 6.5 6.6 5.4  HGB 10.4* 10.5* 10.0* 10.3*  HCT 35.0* 35.6* 33.8* 33.5*  MCV 75.9* 76.6* 75.6* 73.1*  PLT 383 291 328 309   BNP (last 3 results) Recent Labs    04/08/19 0737 04/29/19 0718 05/21/19 1437  BNP >4,500.0* >4,500.0* >4,500.0*    CBG: Recent Labs  Lab 05/21/19 1446  GLUCAP 148*    Recent Results (from the past 240 hour(s))  Expectorated sputum assessment w rflx to resp cult     Status: None   Collection Time: 05/23/19 10:53 AM   Specimen: Sputum  Result Value Ref Range Status   Specimen Description SPUTUM  Final   Special Requests NONE  Final   Sputum evaluation   Final    THIS SPECIMEN IS ACCEPTABLE FOR SPUTUM CULTURE Performed at [facility], [address], [place], [place] [address]    Report Status 05/24/2019 FINAL  Final  Culture, respiratory     Status: None (Preliminary result)   Collection Time: 05/23/19 10:53 AM   Specimen: SPU  Result Value Ref Range Status   Specimen Description   Final     SPUTUM Performed at [facility], [address] [place], [place] [address]    Special Requests   Final    NONE Reflexed from [id number] Performed at [facility], [address] [place], [place] [address]    Gram Stain   Final    RARE WBC PRESENT, PREDOMINANTLY PMN RARE GRAM POSITIVE RODS    Culture   Final    CULTURE REINCUBATED FOR BETTER GROWTH Performed at [facility], [address] [place], [place] [address]    Report Status PENDING  Incomplete     Scheduled Meds: . amLODipine  5 mg Oral q morning - 10a  . aspirin EC  81 mg Oral Daily  . atorvastatin  20 mg Oral q1800  . azithromycin  500 mg Oral Daily  . budesonide (PULMICORT) nebulizer solution  0.5 mg Nebulization BID  . carvedilol  3.125 mg Oral BID WC  . furosemide  40 mg Intravenous BID  . hydrALAZINE  25 mg Oral Q8H  . ipratropium-albuterol  3 mL Nebulization Q6H  . methylPREDNISolone (SOLU-MEDROL) injection  40 mg Intravenous Q12H  . [START ON 05/26/2019] rivaroxaban  20 mg Oral Daily  . sacubitril-valsartan  1 tablet Oral BID   Continuous Infusions: . cefTRIAXone (ROCEPHIN)  IV 2 g (05/24/19 1618)    Assessment/Plan:  1. Acute metabolic encephalopathy.  Improved from admission. 2. Acute hypoxic respiratory failure.  The patient able to move better air and come off the oxygen now that she is more alert. 3. Acute on chronic systolic congestive heart failure.  Diuresis with IV Lasix.  Continue Entresto and Coreg 4. COPD exacerbation.  Continue steroids and nebulizer treatments.  Rocephin and Zithromax. 5. Nonsustained ventricular tachycardia.  I ordered IV magnesium.  Cardiology was informed.  Patient does have a cardiomyopathy. 6. Accelerated hypertension.  Blood pressure very variable during the hospital course.  Hopefully blood pressure trend better on Coreg Norvasc and Entresto 7. Pulmonary hypertension 8. Hyperlipidemia on Lipitor 9. Chronic kidney disease stage  IIIb 10. 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     Code Status:     Code Status Orders  (From admission, onward)         Start     Ordered   05/21/19 2047  Full code  Continuous     05/21/19 2056        Code Status History    Date Active Date Inactive Code Status Order ID Comments User Context   04/29/2019 0952 05/03/2019 1837 Full Code [id number]  Ivor Costa, MD ED   04/08/2019 0950 04/09/2019 1806 Full Code [id number]  Collier Bullock, MD ED   03/24/2019 2343 03/28/2019 1724 Full Code  [id number]  Sidney Ace Arvella Merles, MD ED   03/06/2019 1425 03/10/2019 1658 Full Code [id number]  Ivor Costa, MD Inpatient   02/13/2019 1508 02/16/2019 1846 Full Code [id number]  Max Sane, MD ED   01/31/2019 0815 02/04/2019 1748 Full Code [id number]  Para Skeans, MD ED   01/31/2019 0814 01/31/2019 0815 Full Code [id number]  Para Skeans, MD ED   01/18/2019 0038 01/21/2019 1923 DNR [id number]  Lenore Cordia, MD ED   12/29/2018 0449 01/01/2019 2033 Full Code [id number]  Mansy, Arvella Merles, MD ED   Advance Care Planning Activity     Family Communication: Patient refused Disposition Plan: Status is: Inpatient  Dispo: The patient is from: Home              Anticipated d/c is to: Home.  Transitional care team to look into the patient's eviction notice or not              Anticipated d/c date is: Potentially 05/26/2019.               Patient currently better than when she came in but still wheezing and short of breath.  Patient also had nonsustained ventricular tachycardia today and I am giving IV magnesium.  Transitional care team also helping out with disposition.  Consultants:  Neurology  Cardiology  Antibiotics:  Rocephin  Zithromax  Time spent: 27 minutes.   [place]  [facility] [employer]

## 2019-05-25 NOTE — Progress Notes (Signed)
CCMD reporting 22 beats of v. Tach on tele/ asymptomatic/ Dr. Leslye Peer and Dr. Clayborn Bigness made aware/ IV mag ordered/ will continue to monitor.

## 2019-05-25 NOTE — Progress Notes (Signed)
Bipap refused 

## 2019-05-26 ENCOUNTER — Ambulatory Visit: Payer: Medicaid Other | Admitting: Family

## 2019-05-26 LAB — BASIC METABOLIC PANEL
Anion gap: 7 (ref 5–15)
BUN: 63 mg/dL — ABNORMAL HIGH (ref 8–23)
CO2: 27 mmol/L (ref 22–32)
Calcium: 8.8 mg/dL — ABNORMAL LOW (ref 8.9–10.3)
Chloride: 106 mmol/L (ref 98–111)
Creatinine, Ser: 2 mg/dL — ABNORMAL HIGH (ref 0.44–1.00)
GFR calc Af Amer: 30 mL/min — ABNORMAL LOW (ref >60)
GFR calc non Af Amer: 26 mL/min — ABNORMAL LOW (ref >60)
Glucose, Bld: 213 mg/dL — ABNORMAL HIGH (ref 70–99)
Potassium: 4.6 mmol/L (ref 3.5–5.1)
Sodium: 140 mmol/L (ref 135–145)

## 2019-05-26 LAB — CULTURE, RESPIRATORY W GRAM STAIN: Culture: NORMAL

## 2019-05-26 MED ORDER — IPRATROPIUM-ALBUTEROL 20-100 MCG/ACT IN AERS: 1.0000 | INHALATION_SPRAY | Freq: Four times a day (QID) | RESPIRATORY_TRACT | 0 refills | Status: DC

## 2019-05-26 MED ORDER — PREDNISONE 10 MG PO TABS
ORAL_TABLET | ORAL | 0 refills | Status: DC
Start: 2019-05-26 — End: 2019-06-01

## 2019-05-26 MED ORDER — CEFDINIR 300 MG PO CAPS
300.0000 mg | ORAL_CAPSULE | Freq: Two times a day (BID) | ORAL | 0 refills | Status: AC
Start: 2019-05-26 — End: 2019-05-29

## 2019-05-26 MED ORDER — ENTRESTO 49-51 MG PO TABS: 1.0000 | ORAL_TABLET | Freq: Two times a day (BID) | ORAL | 0 refills | Status: DC

## 2019-05-26 MED ORDER — AZITHROMYCIN 250 MG PO TABS: ORAL_TABLET | ORAL | 0 refills | Status: DC

## 2019-05-26 MED ORDER — AMLODIPINE BESYLATE 5 MG PO TABS: 5.0000 mg | ORAL_TABLET | Freq: Every morning | ORAL | 0 refills | Status: DC

## 2019-05-26 NOTE — Consult Note (Signed)
Called patient's outpatient pharmacy and informed them to cancel amlodipine prescription and pt should be on entresto 49-51 mg BID.   Eleonore Chiquito, PharmD, BCPS

## 2019-05-26 NOTE — Progress Notes (Signed)
[facility] Cardiology    SUBJECTIVE: Patient seems to be doing reasonably well still fatigue still some shortness of breath edema is much improved denies any palpitation tachycardia no fever chills or sweats feels ready to be discharged home follow-up as an outpatient   Vitals:   05/26/19 0521 05/26/19 0732 05/26/19 0814 05/26/19 1111  BP: (!) 114/96 125/87  (!) 127/93  Pulse: 70 (!) 58 71 66  Resp: 15 18  18   Temp: 97.6 F (36.4 C) 98 F (36.7 C)  97.7 F (36.5 C)  TempSrc: Oral Oral    SpO2: 99% 100%  99%  Weight: 102.2 kg     Height:         Intake/Output Summary (Last 24 hours) at 05/26/2019 1757 Last data filed at 05/26/2019 1112 Gross per 24 hour  Intake 360 ml  Output 1900 ml  Net -1540 ml      PHYSICAL EXAM  General: Well developed, well nourished, in no acute distress HEENT:  Normocephalic and atramatic Neck:  No JVD.  Lungs: Clear bilaterally to auscultation and percussion. Heart: HRRR . Normal S1 and S2 without gallops or murmurs.  Abdomen: Bowel sounds are positive, abdomen soft and non-tender  Msk:  Back normal, normal gait. Normal strength and tone for age. Extremities: No clubbing, cyanosis or edema.   Neuro: Alert and oriented X 3. Psych:  Good affect, responds appropriately   LABS: Basic Metabolic Panel: Recent Labs    05/25/19 0408 05/26/19 0446  NA 142 140  K 4.3 4.6  CL 105 106  CO2 27 27  GLUCOSE 187* 213*  BUN 60* 63*  CREATININE 2.11* 2.00*  CALCIUM 9.0 8.8*   Liver Function Tests: No results for input(s): AST, ALT, ALKPHOS, BILITOT, PROT, ALBUMIN in the last 72 hours. No results for input(s): LIPASE, AMYLASE in the last 72 hours. CBC: No results for input(s): WBC, NEUTROABS, HGB, HCT, MCV, PLT in the last 72 hours. Cardiac Enzymes: No results for input(s): CKTOTAL, CKMB, CKMBINDEX, TROPONINI in the last 72 hours. BNP: Invalid input(s): POCBNP D-Dimer: No results for input(s): DDIMER in the last 72 hours. Hemoglobin A1C: No  results for input(s): HGBA1C in the last 72 hours. Fasting Lipid Panel: No results for input(s): CHOL, HDL, LDLCALC, TRIG, CHOLHDL, LDLDIRECT in the last 72 hours. Thyroid Function Tests: Recent Labs    05/24/19 0448  TSH 4.350   Anemia Panel: No results for input(s): VITAMINB12, FOLATE, FERRITIN, TIBC, IRON, RETICCTPCT in the last 72 hours.  No results found.   Echo severely depressed overall left ventricular function less than 25%  TELEMETRY: Normal sinus rhythm left bundle branch block nonspecific ST-T wave changes:  ASSESSMENT AND PLAN:  Principal Problem:   Acute on chronic combined systolic and diastolic CHF (congestive heart failure) (HCC) Active Problems:   Dilated cardiomyopathy (HCC)   Elevated troponin   Hyperlipidemia   Crack cocaine use   LV (left ventricular) mural thrombus   Chronic renal failure, stage 3b   Acute systolic CHF (congestive heart failure) (HCC)   Acute metabolic encephalopathy   History of substance abuse (HCC)   CHF (congestive heart failure) (HCC)   COPD with acute exacerbation (HCC)   Pulmonary hypertension (HCC)   Accelerated hypertension   Non-sustained ventricular tachycardia ([place])  . PLAN Continue heart failure therapy patient currently on Entresto Ventricular apical thrombus maintain anticoagulation with Xarelto Continue inhalers as needed for COPD symptoms Maintain hypertension control with multiple medications including hydralazine amlodipine Entresto Coreg Chronic renal insufficiency consider outpatient  follow-up with nephrology Continue Lipitor therapy for hyperlipidemia Obesity minus recommend weight loss exercise portion control Follow-up with heart failure clinic follow-up with cardiology 1 to 2 weeks     Yolonda Kida, MD 05/26/2019 5:57 PM

## 2019-05-26 NOTE — Discharge Summary (Signed)
[place] [place] NAME: Katelyn Lane    MR#:  [id number]  DATE OF BIRTH:  22-Nov-1955  DATE OF ADMISSION:  05/21/2019 ADMITTING PHYSICIAN: Athena Masse, MD  DATE OF DISCHARGE: 05/26/2019  1:24 PM  PRIMARY CARE PHYSICIAN: [name], MD    ADMISSION DIAGNOSIS:  Shortness of breath [R06.02] Somnolence [R40.0] CHF (congestive heart failure) (HCC) [I50.9] Congestive heart failure, unspecified HF chronicity, unspecified heart failure type ([place]) [I50.9]  DISCHARGE DIAGNOSIS:  Principal Problem:   Acute on chronic combined systolic and diastolic CHF (congestive heart failure) (HCC) Active Problems:   Dilated cardiomyopathy (HCC)   Elevated troponin   Hyperlipidemia   Crack cocaine use   LV (left ventricular) mural thrombus   Chronic renal failure, stage 3b   Acute systolic CHF (congestive heart failure) (HCC)   Acute metabolic encephalopathy   History of substance abuse (HCC)   CHF (congestive heart failure) (HCC)   COPD with acute exacerbation ([place])   Pulmonary hypertension ([place])   Accelerated hypertension   Non-sustained ventricular tachycardia ([place])   SECONDARY DIAGNOSIS:   Past Medical History:  Diagnosis Date  . CHF (congestive heart failure) ([place])   . COVID-19   . Hyperlipidemia   . Hypertension   . Renal disorder     HOSPITAL COURSE:   1.  Acute metabolic encephalopathy.  This is much improved from the first 3 days and I saw her where she was basically very lethargic. 2.  Acute hypoxic respiratory failure.  The patient was on high flow nasal cannula for the first 3 days in order to move air.  The patient is moving better air and lungs are clear and she was tapered off oxygen. 3.  Acute on chronic systolic congestive heart failure.  We diuresed with IV Lasix during the entire hospital course.  I increased her Entresto dose and will continue Coreg.  We will because of chronic kidney dysfunction  spironolactone contraindicated.  Lasix switch over to p.o.. 4.  COPD exacerbation continue steroids for 1 more day upon discharge.  Rocephin and Zithromax given during the hospital course.  Switched to [facility] for 5 more doses and Zithromax for 2 more doses. 5.  Pulmonary hypertension 6.  Accelerated hypertension.  Blood pressure very variable during the hospital course.  Upon discharge in order to make her regimen simpler I kept on Coreg but increase the Entresto dose. 7.  Hyperlipidemia unspecified on Lipitor 8.  Chronic kidney disease stage IIIb 9.  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  Advised not to smoke crack cocaine anymore. 11.  Case discussed with transitional care team and they will give the patient information about shelters if she is getting evicted. 12.  Nonsustained ventricular tachycardia.  Follow-up with cardiology as outpatient.  Electrolytes given yesterday.  Will have to be compliant with regimen and follow-up with cardiology in order for further evaluation for this as outpatient   DISCHARGE CONDITIONS:   satisfactory  CONSULTS OBTAINED:  Treatment Team:  Leotis Pain, MD  DRUG ALLERGIES:   Allergies  Allergen Reactions  . Penicillins Hives, Rash and Other (See Comments)    Did it involve swelling of the face/tongue/throat, SOB, or low BP? No Did it involve sudden or severe rash/hives, skin peeling, or any reaction on the inside of your mouth or nose? Yes Did you need to seek medical attention at a hospital or doctor's office? Yes When did it last happen? >25 years If all above  answers are "NO", may proceed with cephalosporin use.   . Ace Inhibitors Nausea And Vomiting    DISCHARGE MEDICATIONS:   Allergies as of 05/26/2019      Reactions   Penicillins Hives, Rash, Other (See Comments)   Did it involve swelling of the face/tongue/throat, SOB, or low BP? No Did it involve sudden or severe rash/hives, skin peeling, or any reaction on the  inside of your mouth or nose? Yes Did you need to seek medical attention at a hospital or doctor's office? Yes When did it last happen? >25 years If all above answers are "NO", may proceed with cephalosporin use.   Ace Inhibitors Nausea And Vomiting      Medication List    STOP taking these medications   albuterol 108 (90 Base) MCG/ACT inhaler Commonly known as: VENTOLIN HFA   dicyclomine 20 MG tablet Commonly known as: BENTYL   Entresto 24-26 MG Generic drug: sacubitril-valsartan Replaced by: Delene Loll 49-51 MG     TAKE these medications   ascorbic acid 500 MG tablet Commonly known as: VITAMIN C Take 500 mg by mouth daily.   aspirin 81 MG chewable tablet Chew 1 tablet (81 mg total) by mouth daily.   atorvastatin 20 MG tablet Commonly known as: LIPITOR Take 1 tablet (20 mg total) by mouth daily at 6 PM.   azithromycin 250 MG tablet Commonly known as: ZITHROMAX One tab daily for two days Start taking on: May 27, 2019   carvedilol 3.125 MG tablet Commonly known as: COREG Take 1 tablet (3.125 mg total) by mouth 2 (two) times daily with a meal.   cefdinir 300 MG capsule Commonly known as: OMNICEF Take 1 capsule (300 mg total) by mouth 2 (two) times daily for 5 doses.   docusate calcium 240 MG capsule Commonly known as: SURFAK Take 240 mg by mouth daily as needed for moderate constipation.   Entresto 49-51 MG Generic drug: sacubitril-valsartan Take 1 tablet by mouth 2 (two) times daily. Replaces: Entresto 24-26 MG   FLUoxetine 20 MG capsule Commonly known as: PROZAC Take 1 capsule (20 mg total) by mouth daily. What changed: Another medication with the same name was removed. Continue taking this medication, and follow the directions you see here.   furosemide 40 MG tablet Commonly known as: Lasix Take 2 tablets (80 mg total) by mouth daily.   Ipratropium-Albuterol 20-100 MCG/ACT Aers respimat Commonly known as: COMBIVENT Inhale 1 puff into the lungs  every 6 (six) hours.   loratadine 10 MG tablet Commonly known as: CLARITIN Take 10 mg by mouth daily as needed for allergies.   nitroGLYCERIN 0.4 MG SL tablet Commonly known as: NITROSTAT Place 1 tablet (0.4 mg total) under the tongue every 5 (five) minutes as needed for chest pain.   pantoprazole 40 MG tablet Commonly known as: Protonix Take 1 tablet (40 mg total) by mouth daily.   predniSONE 10 MG tablet Commonly known as: DELTASONE 4 tabs po on 05/27/2019 then stop   rivaroxaban 20 MG Tabs tablet Commonly known as: XARELTO Take 1 tablet (20 mg total) by mouth daily with supper. For the blood clot in your heart.        DISCHARGE INSTRUCTIONS:   Follow-up PMD 5 days Follow-up cardiology 1 to 2 weeks Follow-up CHF clinic  If you experience worsening of your admission symptoms, develop shortness of breath, life threatening emergency, suicidal or homicidal thoughts you must seek medical attention immediately by calling 911 or calling your MD immediately  if symptoms  less severe.  You Must read complete instructions/literature along with all the possible adverse reactions/side effects for all the Medicines you take and that have been prescribed to you. Take any new Medicines after you have completely understood and accept all the possible adverse reactions/side effects.   Please note  You were cared for by a hospitalist during your hospital stay. If you have any questions about your discharge medications or the care you received while you were in the hospital after you are discharged, you can call the unit and asked to speak with the hospitalist on call if the hospitalist that took care of you is not available. Once you are discharged, your primary care physician will handle any further medical issues. Please note that NO REFILLS for any discharge medications will be authorized once you are discharged, as it is imperative that you return to your primary care physician (or establish  a relationship with a primary care physician if you do not have one) for your aftercare needs so that they can reassess your need for medications and monitor your lab values.    Today   CHIEF COMPLAINT:   Chief Complaint  Patient presents with  . Weakness    HISTORY OF PRESENT ILLNESS:  Katelyn Lane  is a 64 y.o. female came in with weakness and treated for CHF exacerbation and smells of alcohol or pain COPD exacerbation   VITAL SIGNS:  Blood pressure (!) 127/93, pulse 66, temperature 97.7 F (36.5 C), resp. rate 18, height 5\' 9"  (1.753 m), weight 102.2 kg, SpO2 99 %.  I/O:    Intake/Output Summary (Last 24 hours) at 05/26/2019 1701 Last data filed at 05/26/2019 1112 Gross per 24 hour  Intake 360 ml  Output 4000 ml  Net -3640 ml    PHYSICAL EXAMINATION:  GENERAL:  64 y.o.-year-old patient lying in the bed with no acute distress.  EYES: Pupils equal, round, reactive to light and accommodation. No scleral icterus. HEENT: Head atraumatic, normocephalic. Oropharynx and nasopharynx clear.   LUNGS: Decreased breath sounds bilaterally, no wheezing, rales,rhonchi or crepitation. No use of accessory muscles of respiration.  CARDIOVASCULAR: S1, S2 normal. No murmurs, rubs, or gallops.  ABDOMEN: Soft, non-tender, non-distended. Bowel sounds present. No organomegaly or mass.  EXTREMITIES: No pedal edema, cyanosis, or clubbing.  NEUROLOGIC: Cranial nerves II through XII are intact. Muscle strength 5/5 in all extremities. Sensation intact. Gait not checked.  PSYCHIATRIC: The patient is alert and oriented x 3.  SKIN: No obvious rash, lesion, or ulcer.   DATA REVIEW:   CBC Recent Labs  Lab 05/23/19 0603  WBC 5.4  HGB 10.3*  HCT 33.5*  PLT 309    Chemistries  Recent Labs  Lab 05/21/19 1437 05/22/19 0459 05/26/19 0446  NA 142   < > 140  K 4.0   < > 4.6  CL 106   < > 106  CO2 26   < > 27  GLUCOSE 154*   < > 213*  BUN 46*   < > 63*  CREATININE 1.93*   < > 2.00*   CALCIUM 9.0   < > 8.8*  AST 44*  --   --   ALT 33  --   --   ALKPHOS 223*  --   --   BILITOT 1.4*  --   --    < > = values in this interval not displayed.    Microbiology Results  Results for orders placed or performed during the hospital encounter of  05/21/19  Expectorated sputum assessment w rflx to resp cult     Status: None   Collection Time: 05/23/19 10:53 AM   Specimen: Sputum  Result Value Ref Range Status   Specimen Description SPUTUM  Final   Special Requests NONE  Final   Sputum evaluation   Final    THIS SPECIMEN IS ACCEPTABLE FOR SPUTUM CULTURE Performed at [facility], [address], [place], [place] [address]    Report Status 05/24/2019 FINAL  Final  Culture, respiratory     Status: None   Collection Time: 05/23/19 10:53 AM   Specimen: SPU  Result Value Ref Range Status   Specimen Description   Final    SPUTUM Performed at [facility], [address] [place], [name] [address]    Special Requests   Final    NONE Reflexed from [id number] Performed at [facility], [address] [place], [place] [address]    Gram Stain   Final    RARE WBC PRESENT, PREDOMINANTLY PMN RARE GRAM POSITIVE RODS    Culture   Final    Consistent with normal respiratory flora. Performed at [facility], [place] [address], [place], [place] [address]    Report Status 05/26/2019 FINAL  Final     Management plans discussed with the patient, family and they are in agreement.  CODE STATUS:     Code Status Orders  (From admission, onward)         Start     Ordered   05/21/19 2047  Full code  Continuous     05/21/19 2056        Code Status History    Date Active Date Inactive Code Status Order ID Comments User Context   04/29/2019 0952 05/03/2019 1837 Full Code [id number]  Ivor Costa, MD ED   04/08/2019 0950 04/09/2019 1806 Full Code [id number]  Collier Bullock, MD ED   03/24/2019 2343 03/28/2019 1724 Full Code [id number]  Mansy, Arvella Merles, MD ED    03/06/2019 1425 03/10/2019 1658 Full Code [id number]  Ivor Costa, MD Inpatient   02/13/2019 1508 02/16/2019 1846 Full Code [id number]  Max Sane, MD ED   01/31/2019 0815 02/04/2019 1748 Full Code [id number]  Para Skeans, MD ED   01/31/2019 0814 01/31/2019 0815 Full Code [id number]  Para Skeans, MD ED   01/18/2019 0038 01/21/2019 1923 DNR [id number]  Lenore Cordia, MD ED   12/29/2018 0449 01/01/2019 2033 Full Code [id number]  Mansy, Arvella Merles, MD ED   Advance Care Planning Activity      TOTAL TIME TAKING CARE OF THIS PATIENT: 35 minutes.    Loletha Grayer M.D on 05/26/2019 at 5:01 PM  Between 7am to 6pm - Pager - [phone]  After 6pm go to [email or web address] - password [email or web address]  [facility]  CC: Primary care physician; Revelo, Elyse Jarvis, MD

## 2019-05-26 NOTE — Plan of Care (Signed)
  Problem: Clinical Measurements: Goal: Ability to maintain clinical measurements within normal limits will improve Outcome: Progressing Goal: Will remain free from infection Outcome: Progressing   Problem: Clinical Measurements: Goal: Respiratory complications will improve Outcome: Not Progressing Note: Patient has audible wheezes

## 2019-05-26 NOTE — Discharge Instructions (Signed)

## 2019-05-28 ENCOUNTER — Telehealth (HOSPITAL_COMMUNITY): Payer: Self-pay

## 2019-05-28 ENCOUNTER — Telehealth: Payer: Self-pay | Admitting: Family

## 2019-05-28 NOTE — Telephone Encounter (Signed)
Attempted to contact due to her discharge.  No answer and unable to leave message.  Will continue to try.   [place] [phone]

## 2019-05-28 NOTE — Telephone Encounter (Signed)
Spoke with patient who said she is still doing bad since she was discharged from the hospital a couple days ago. She is still swelling, SOB, and chest pressure. She is taking all her medications, following a low sodium diet, not able to exercise, and is checking her weight daily. She confirmed her appointment for 5/24 as long as she is able.   Alyse Low, [place]

## 2019-05-29 ENCOUNTER — Telehealth: Payer: Self-pay | Admitting: Family

## 2019-05-29 NOTE — Telephone Encounter (Signed)
Virtual Visit Pre-Appointment Phone Call  "Ms Mastro, I am calling you today to discuss your upcoming appointment. We are currently trying to limit exposure to the virus that causes COVID-19 by seeing patients at home rather than in the office."  1. "What is the BEST phone number to call the day of the visit?" - include this in appointment notes  2. "Do you have or have access to (through a family member/friend) a smartphone with video capability that we can use for your visit?" a. If yes - list this number in appt notes as "cell" (if different from BEST phone #) and list the appointment type as a VIDEO visit in appointment notes b. If no - list the appointment type as a PHONE visit in appointment notes  3. Confirm consent - "In the setting of the current Covid19 crisis, you are scheduled for a (phone or video) visit with your provider on (date) at (time).  Just as we do with many in-office visits, in order for you to participate in this visit, we must obtain consent.  If you'd like, I can send this to your mychart (if signed up) or email for you to review.  Otherwise, I can obtain your verbal consent now.  All virtual visits are billed to your insurance company just like a normal visit would be.  By agreeing to a virtual visit, we'd like you to understand that the technology does not allow for your provider to perform an examination, and thus may limit your provider's ability to fully assess your condition. If your provider identifies any concerns that need to be evaluated in person, we will make arrangements to do so.  Finally, though the technology is pretty good, we cannot assure that it will always work on either your or our end, and in the setting of a video visit, we may have to convert it to a phone-only visit.  In either situation, we cannot ensure that we have a secure connection.  Are you willing to proceed?" STAFF: Did the patient verbally acknowledge consent to telehealth visit? Document  YES/NO here: yes  4. Advise patient to be prepared - "Two hours prior to your appointment, go ahead and check your blood pressure, pulse, oxygen saturation, and your weight (if you have the equipment to check those) and write them all down. When your visit starts, your provider will ask you for this information. If you have an Apple Watch or Kardia device, please plan to have heart rate information ready on the day of your appointment. Please have a pen and paper handy nearby the day of the visit as well."  5. Give patient instructions for MyChart download to smartphone OR Doximity/Doxy.me as below if video visit (depending on what platform provider is using)  6. Inform patient they will receive a phone call 15 minutes prior to their appointment time (may be from unknown caller ID) so they should be prepared to answer    TELEPHONE CALL NOTE  Aniyiah Zell has been deemed a candidate for a follow-up tele-health visit to limit community exposure during the Covid-19 pandemic. I spoke with the patient via phone to ensure availability of phone/video source, confirm preferred email & phone number, and discuss instructions and expectations.  I reminded Ayda Tancredi to be prepared with any vital sign and/or heart rhythm information that could potentially be obtained via home monitoring, at the time of her visit. I reminded Adrena Nakamura to expect a phone call prior to her visit.  Alisa Graff, [place] 05/29/2019 2:08 PM   INSTRUCTIONS FOR DOWNLOADING THE MYCHART APP TO SMARTPHONE  - The patient must first make sure to have activated MyChart and know their login information - If Apple, go to [employer] and type in MyChart in the search bar and download the app. If Android, ask patient to go to [employer] and type in [place] in the search bar and download the app. The app is free but as with any other app downloads, their phone may require them to verify saved payment information or Apple/Android  password.  - The patient will need to then log into the app with their MyChart username and password, and select [facility] as their healthcare provider to link the account. When it is time for your visit, go to the MyChart app, find appointments, and click Begin Video Visit. Be sure to Select Allow for your device to access the Microphone and Camera for your visit. You will then be connected, and your provider will be with you shortly.  **If they have any issues connecting, or need assistance please contact MyChart service desk [phone])**  **If using a computer, in order to ensure the best quality for their visit they will need to use either of the following Internet Browsers: [employer], or Google Chrome**  IF USING DOXIMITY or DOXY.ME - The patient will receive a link just prior to their visit by text.     FULL LENGTH CONSENT FOR TELE-HEALTH VISIT   I hereby voluntarily request, consent and authorize [facility] and its employed or contracted physicians, physician assistants, nurse practitioners or other licensed health care professionals (the Practitioner), to provide me with telemedicine health care services (the "Services") as deemed necessary by the treating Practitioner. I acknowledge and consent to receive the Services by the Practitioner via telemedicine. I understand that the telemedicine visit will involve communicating with the Practitioner through live audiovisual communication technology and the disclosure of certain medical information by electronic transmission. I acknowledge that I have been given the opportunity to request an in-person assessment or other available alternative prior to the telemedicine visit and am voluntarily participating in the telemedicine visit.  I understand that I have the right to withhold or withdraw my consent to the use of telemedicine in the course of my care at any time, without affecting my right to future care or treatment, and  that the Practitioner or I may terminate the telemedicine visit at any time. I understand that I have the right to inspect all information obtained and/or recorded in the course of the telemedicine visit and may receive copies of available information for a reasonable fee.  I understand that some of the potential risks of receiving the Services via telemedicine include:  Marland Kitchen Delay or interruption in medical evaluation due to technological equipment failure or disruption; . Information transmitted may not be sufficient (e.g. poor resolution of images) to allow for appropriate medical decision making by the Practitioner; and/or  . In rare instances, security protocols could fail, causing a breach of personal health information.  Furthermore, I acknowledge that it is my responsibility to provide information about my medical history, conditions and care that is complete and accurate to the best of my ability. I acknowledge that Practitioner's advice, recommendations, and/or decision may be based on factors not within their control, such as incomplete or inaccurate data provided by me or distortions of diagnostic images or specimens that may result from electronic transmissions. I understand that  the practice of medicine is not an exact science and that Practitioner makes no warranties or guarantees regarding treatment outcomes. I acknowledge that I will receive a copy of this consent concurrently upon execution via email to the email address I last provided but may also request a printed copy by calling the office of [facility].    I understand that my insurance will be billed for this visit.   I have read or had this consent read to me. . I understand the contents of this consent, which adequately explains the benefits and risks of the Services being provided via telemedicine.  . I have been provided ample opportunity to ask questions regarding this consent and the Services and have had my  questions answered to my satisfaction. . I give my informed consent for the services to be provided through the use of telemedicine in my medical care  By participating in this telemedicine visit I agree to the above.

## 2019-06-01 ENCOUNTER — Telehealth (HOSPITAL_COMMUNITY): Payer: Self-pay

## 2019-06-01 ENCOUNTER — Ambulatory Visit: Payer: Medicaid Other | Attending: Family | Admitting: Family

## 2019-06-01 ENCOUNTER — Other Ambulatory Visit: Payer: Self-pay

## 2019-06-01 DIAGNOSIS — Z7982 Long term (current) use of aspirin: Secondary | ICD-10-CM | POA: Insufficient documentation

## 2019-06-01 DIAGNOSIS — Z7901 Long term (current) use of anticoagulants: Secondary | ICD-10-CM | POA: Insufficient documentation

## 2019-06-01 DIAGNOSIS — F17210 Nicotine dependence, cigarettes, uncomplicated: Secondary | ICD-10-CM | POA: Insufficient documentation

## 2019-06-01 DIAGNOSIS — Z79899 Other long term (current) drug therapy: Secondary | ICD-10-CM | POA: Insufficient documentation

## 2019-06-01 DIAGNOSIS — Z880 Allergy status to penicillin: Secondary | ICD-10-CM | POA: Insufficient documentation

## 2019-06-01 DIAGNOSIS — I110 Hypertensive heart disease with heart failure: Secondary | ICD-10-CM | POA: Diagnosis not present

## 2019-06-01 DIAGNOSIS — M7989 Other specified soft tissue disorders: Secondary | ICD-10-CM | POA: Insufficient documentation

## 2019-06-01 DIAGNOSIS — I5022 Chronic systolic (congestive) heart failure: Secondary | ICD-10-CM | POA: Diagnosis present

## 2019-06-01 DIAGNOSIS — I5020 Unspecified systolic (congestive) heart failure: Secondary | ICD-10-CM

## 2019-06-01 DIAGNOSIS — E785 Hyperlipidemia, unspecified: Secondary | ICD-10-CM | POA: Diagnosis not present

## 2019-06-01 DIAGNOSIS — Z8616 Personal history of COVID-19: Secondary | ICD-10-CM | POA: Insufficient documentation

## 2019-06-01 DIAGNOSIS — Z720 Tobacco use: Secondary | ICD-10-CM

## 2019-06-01 DIAGNOSIS — I10 Essential (primary) hypertension: Secondary | ICD-10-CM

## 2019-06-01 NOTE — Telephone Encounter (Signed)
Contacted Katelyn Lane today and after several attempts she answered.  She states living on the [address] and in her car.  She stated in the beginning that I could not meet up with her and after talking with her I advised her that I will call about 12 noon tomorrow and where ever she is I will come to her.  She states feeling bad.  She states just bought her meds nad just now taking them.  She states has them all now.  Will attempt tomorrow to meet up with her and see how I can help with her situation.   [place] [phone]

## 2019-06-01 NOTE — Patient Instructions (Signed)
Begin weighing daily and call for an overnight weight gain of > 2 pounds or a weekly weight gain of >5 pounds. 

## 2019-06-01 NOTE — Progress Notes (Signed)
Virtual Visit via Telephone Note   Evaluation Performed:  Follow-up visit  This visit type was conducted due to national recommendations for restrictions regarding the COVID-19 Pandemic (e.g. social distancing).  This format is felt to be most appropriate for this patient at this time.  All issues noted in this document were discussed and addressed.  No physical exam was performed (except for noted visual exam findings with Video Visits).  Please refer to the patient's chart (MyChart message for video visits and phone note for telephone visits) for the patient's consent to telehealth for [facility]  Date:  06/01/2019   ID:  Katelyn Lane, DOB December 30, 1955, MRN [id number]  Patient Location:  [place] [address]   Provider location:   [facility] [address], [place] [address]  PCP:  [name], MD  Cardiologist:  none Electrophysiologist:  None   Chief Complaint:  Shortness of breath  History of Present Illness:    Katelyn Lane is a 63 y.o. female who presents via audio/video conferencing for a telehealth visit today.  Patient verified DOB and address.  The patient does not have symptoms concerning for COVID-19 infection (fever, chills, cough, or new SHORTNESS OF BREATH).   Patient reports a moderate amount of shortness of breath with little exertion. She describes this as being chronic in nature. She has associated swelling in her legs which she says is better than it was. She denies any dizziness, chest pain, cough or difficulty sleeping although is very scattered in her thoughts and answers to my questions making it difficult to understand what she is saying or meaning.   She does say that she hasn't been weighing herself and that she hasn't taken any of her medications since yesterday (it's 11:30am right now) and she's not saying whether she'll take them or not. She does say that she's getting ready  to be homeless and that "some nice lady off of [address] called her and she'd like to talk to her again. She says that she's currently smoking cigarettes while talking to me and last did cocaine "the week before last" but that she'll probably do it again because it helps. Understands that cocaine is not good for her heart.   Prior CV studies:   The following studies were reviewed today:  Echo report from 04/29/19 reviewed and showed an EF of <20% along with severely elevated PA pressure.   BMP from 05/26/19 reviewed and showed sodium 140, potassium 4.6, creatinine 2 and GFR 30  Past Medical History:  Diagnosis Date  . CHF (congestive heart failure) ([place])   . COVID-19   . Hyperlipidemia   . Hypertension   . Renal disorder    Past Surgical History:  Procedure Laterality Date  . RIGHT/LEFT HEART CATH AND CORONARY ANGIOGRAPHY N/A 12/30/2018   Procedure: RIGHT/LEFT HEART CATH AND CORONARY ANGIOGRAPHY;  Surgeon: Corey Skains, MD;  Location: [facility];  Service: Cardiovascular;  Laterality: N/A;     No outpatient medications have been marked as taking for the 06/01/19 encounter (Appointment) with Alisa Graff, FNP.     Allergies:   Penicillins and Ace inhibitors   Social History   Tobacco Use  . Smoking status: Current Every Day Smoker  . Smokeless tobacco: Never Used  Substance Use Topics  . Alcohol use: No    Alcohol/week: 0.0 standard drinks  . Drug use: Yes    Types: Cocaine  Comment: 4-5 days ago     Prior to Admission medications   Medication Sig Start Date End Date Taking? Authorizing Provider  albuterol (VENTOLIN HFA) 108 (90 Base) MCG/ACT inhaler Inhale 2 puffs into the lungs every 6 (six) hours as needed for wheezing or shortness of breath.   Yes [provider]  ascorbic acid (VITAMIN C) 500 MG tablet Take 500 mg by mouth daily.   Yes [provider]  aspirin 81 MG chewable tablet Chew 1 tablet (81 mg total) by mouth daily.  05/03/19  Yes Charlynne Cousins, MD  atorvastatin (LIPITOR) 20 MG tablet Take 1 tablet (20 mg total) by mouth daily at 6 PM. 05/03/19 08/01/19 Yes Charlynne Cousins, MD  carvedilol (COREG) 3.125 MG tablet Take 1 tablet (3.125 mg total) by mouth 2 (two) times daily with a meal. 05/03/19 08/01/19 Yes Charlynne Cousins, MD  docusate calcium (SURFAK) 240 MG capsule Take 240 mg by mouth daily as needed for moderate constipation.    Yes [provider]  FLUoxetine (PROZAC) 20 MG capsule Take 1 capsule (20 mg total) by mouth daily. 05/03/19  Yes Charlynne Cousins, MD  furosemide (LASIX) 40 MG tablet Take 2 tablets (80 mg total) by mouth daily. 05/03/19 08/01/19 Yes Charlynne Cousins, MD  Ipratropium-Albuterol (COMBIVENT) 20-100 MCG/ACT AERS respimat Inhale 1 puff into the lungs every 6 (six) hours. 05/26/19  Yes Wieting, Richard, MD  loratadine (CLARITIN) 10 MG tablet Take 10 mg by mouth daily as needed for allergies.    Yes [provider]  nitroGLYCERIN (NITROSTAT) 0.4 MG SL tablet Place 1 tablet (0.4 mg total) under the tongue every 5 (five) minutes as needed for chest pain. 05/03/19  Yes Charlynne Cousins, MD  pantoprazole (PROTONIX) 40 MG tablet Take 1 tablet (40 mg total) by mouth daily. 05/03/19  Yes Charlynne Cousins, MD  rivaroxaban (XARELTO) 20 MG TABS tablet Take 1 tablet (20 mg total) by mouth daily with supper. For the blood clot in your heart. 05/03/19 08/01/19 Yes Charlynne Cousins, MD  sacubitril-valsartan (ENTRESTO) 49-51 MG Take 1 tablet by mouth 2 (two) times daily. 05/26/19  Yes Loletha Grayer, MD    Family Hx: The patient's family history is negative for Breast cancer.  ROS:   Please see the history of present illness.     All other systems reviewed and are negative.   Labs/Other Tests and Data Reviewed:    Recent Labs: 04/30/2019: Magnesium 2.0 05/21/2019: ALT 33; B Natriuretic Peptide >4,500.0 05/23/2019: Hemoglobin 10.3; Platelets  309 05/24/2019: TSH 4.350 05/26/2019: BUN 63; Creatinine, Ser 2.00; Potassium 4.6; Sodium 140   Recent Lipid Panel Lab Results  Component Value Date/Time   CHOL 100 05/22/2019 04:59 AM   TRIG 56 05/22/2019 04:59 AM   HDL 46 05/22/2019 04:59 AM   CHOLHDL 2.2 05/22/2019 04:59 AM   LDLCALC 43 05/22/2019 04:59 AM    Wt Readings from Last 3 Encounters:  05/26/19 225 lb 4.8 oz (102.2 kg)  05/21/19 218 lb (98.9 kg)  05/20/19 218 lb (98.9 kg)     Exam:    Vital Signs:  There were no vitals taken for this visit.   Well nourished, well developed female in no  acute distress.   ASSESSMENT & PLAN:    1: Chronic heart failure with reduced ejection fraction- - NYHA class III - unable to assess fluid status as patient is unclear of how she's feeling; says that her leg swelling is better but is not  weighing herself daily - paramedic has made contact with patient in the past; asked patient if I could call paramedic and for her to call patient and if she would answer the phone for her and says that she will - BNP on 04/29/19 was >4500.0  2: HTN- - not checking blood pressure at home - BMP 05/20/19 reviewed and showed sodium 146, potassium 4.1, creatinine 2.3 and GFR 25  3: Tobacco use- - smoking 1/2 ppd of cigarettes - + cocaine use and she says that she last used cocaine the "week before last" - unclear if she's drank any alcohol   COVID-19 Education: The signs and symptoms of COVID-19 were discussed with the patient and how to seek care for testing (follow up with PCP or arrange E-visit).  The importance of social distancing was discussed today.  Patient Risk:   After full review of this patients clinical status, I feel that they are at least moderate risk at this time.  Time:   Today, I have spent 18 minutes with the patient with telehealth technology discussing medications and symptoms to report     Medication Adjustments/Labs and Tests Ordered: Current medicines are reviewed at  length with the patient today.  Concerns regarding medicines are outlined above.   Tests Ordered: No orders of the defined types were placed in this encounter.  Medication Changes: No orders of the defined types were placed in this encounter.   Disposition:  Follow-up in 1 month or sooner for any questions/problems before then.  Signed, Alisa Graff, FNP  06/01/2019 11:47 AM    [facility]

## 2019-06-02 ENCOUNTER — Telehealth (HOSPITAL_COMMUNITY): Payer: Self-pay

## 2019-06-02 NOTE — Telephone Encounter (Signed)
Attempted to contact today like I advised [name] yesterday.  No answer and unable to leave message.  Will continue to try to contact and figure out where she is staying.  She will not let me know at this point.   [name] [phone]

## 2019-06-03 ENCOUNTER — Other Ambulatory Visit: Payer: Self-pay

## 2019-06-03 ENCOUNTER — Inpatient Hospital Stay
Admission: EM | Admit: 2019-06-03 | Discharge: 2019-06-06 | DRG: 291 | Disposition: A | Discharge status: Home / Self Care | Payer: Medicaid Other | Attending: Internal Medicine | Admitting: Internal Medicine

## 2019-06-03 ENCOUNTER — Emergency Department: Payer: Medicaid Other

## 2019-06-03 DIAGNOSIS — I4729 Other ventricular tachycardia: Secondary | ICD-10-CM

## 2019-06-03 DIAGNOSIS — I130 Hypertensive heart and chronic kidney disease with heart failure and stage 1 through stage 4 chronic kidney disease, or unspecified chronic kidney disease: Secondary | ICD-10-CM | POA: Diagnosis present

## 2019-06-03 DIAGNOSIS — I248 Other forms of acute ischemic heart disease: Secondary | ICD-10-CM | POA: Diagnosis present

## 2019-06-03 DIAGNOSIS — I420 Dilated cardiomyopathy: Secondary | ICD-10-CM | POA: Diagnosis present

## 2019-06-03 DIAGNOSIS — Z79899 Other long term (current) drug therapy: Secondary | ICD-10-CM | POA: Diagnosis not present

## 2019-06-03 DIAGNOSIS — Z888 Allergy status to other drugs, medicaments and biological substances status: Secondary | ICD-10-CM

## 2019-06-03 DIAGNOSIS — Z9119 Patient's noncompliance with other medical treatment and regimen: Secondary | ICD-10-CM | POA: Diagnosis not present

## 2019-06-03 DIAGNOSIS — Z8673 Personal history of transient ischemic attack (TIA), and cerebral infarction without residual deficits: Secondary | ICD-10-CM

## 2019-06-03 DIAGNOSIS — J449 Chronic obstructive pulmonary disease, unspecified: Secondary | ICD-10-CM

## 2019-06-03 DIAGNOSIS — Z6832 Body mass index (BMI) 32.0-32.9, adult: Secondary | ICD-10-CM | POA: Diagnosis not present

## 2019-06-03 DIAGNOSIS — N1832 Chronic kidney disease, stage 3b: Secondary | ICD-10-CM | POA: Diagnosis present

## 2019-06-03 DIAGNOSIS — N1831 Chronic kidney disease, stage 3a: Secondary | ICD-10-CM

## 2019-06-03 DIAGNOSIS — I272 Pulmonary hypertension, unspecified: Secondary | ICD-10-CM | POA: Diagnosis present

## 2019-06-03 DIAGNOSIS — I160 Hypertensive urgency: Secondary | ICD-10-CM | POA: Diagnosis present

## 2019-06-03 DIAGNOSIS — I5043 Acute on chronic combined systolic (congestive) and diastolic (congestive) heart failure: Secondary | ICD-10-CM | POA: Diagnosis present

## 2019-06-03 DIAGNOSIS — I509 Heart failure, unspecified: Secondary | ICD-10-CM | POA: Diagnosis not present

## 2019-06-03 DIAGNOSIS — Z7901 Long term (current) use of anticoagulants: Secondary | ICD-10-CM | POA: Diagnosis not present

## 2019-06-03 DIAGNOSIS — Z8616 Personal history of COVID-19: Secondary | ICD-10-CM

## 2019-06-03 DIAGNOSIS — F329 Major depressive disorder, single episode, unspecified: Secondary | ICD-10-CM | POA: Diagnosis present

## 2019-06-03 DIAGNOSIS — I513 Intracardiac thrombosis, not elsewhere classified: Secondary | ICD-10-CM | POA: Diagnosis present

## 2019-06-03 DIAGNOSIS — Z880 Allergy status to penicillin: Secondary | ICD-10-CM

## 2019-06-03 DIAGNOSIS — E669 Obesity, unspecified: Secondary | ICD-10-CM | POA: Diagnosis present

## 2019-06-03 DIAGNOSIS — M109 Gout, unspecified: Secondary | ICD-10-CM | POA: Diagnosis present

## 2019-06-03 DIAGNOSIS — I472 Ventricular tachycardia: Secondary | ICD-10-CM | POA: Diagnosis present

## 2019-06-03 DIAGNOSIS — F419 Anxiety disorder, unspecified: Secondary | ICD-10-CM | POA: Diagnosis present

## 2019-06-03 DIAGNOSIS — E785 Hyperlipidemia, unspecified: Secondary | ICD-10-CM | POA: Diagnosis present

## 2019-06-03 DIAGNOSIS — Z7982 Long term (current) use of aspirin: Secondary | ICD-10-CM | POA: Diagnosis not present

## 2019-06-03 DIAGNOSIS — F17200 Nicotine dependence, unspecified, uncomplicated: Secondary | ICD-10-CM | POA: Diagnosis present

## 2019-06-03 DIAGNOSIS — N179 Acute kidney failure, unspecified: Secondary | ICD-10-CM

## 2019-06-03 DIAGNOSIS — F32A Depression, unspecified: Secondary | ICD-10-CM | POA: Diagnosis present

## 2019-06-03 LAB — CBC
HCT: 35.5 % — ABNORMAL LOW (ref 36.0–46.0)
Hemoglobin: 10.4 g/dL — ABNORMAL LOW (ref 12.0–15.0)
MCH: 22.8 pg — ABNORMAL LOW (ref 26.0–34.0)
MCHC: 29.3 g/dL — ABNORMAL LOW (ref 30.0–36.0)
MCV: 77.7 fL — ABNORMAL LOW (ref 80.0–100.0)
Platelets: 239 10*3/uL (ref 150–400)
RBC: 4.57 MIL/uL (ref 3.87–5.11)
RDW: 21.4 % — ABNORMAL HIGH (ref 11.5–15.5)
WBC: 5.7 10*3/uL (ref 4.0–10.5)
nRBC: 0 % (ref 0.0–0.2)

## 2019-06-03 LAB — BRAIN NATRIURETIC PEPTIDE
B Natriuretic Peptide: >4500 pg/mL — ABNORMAL HIGH (ref 0.0–100.0)
B Natriuretic Peptide: >4500 pg/mL — ABNORMAL HIGH (ref 0.0–100.0)

## 2019-06-03 LAB — MAGNESIUM: Magnesium: 2 mg/dL (ref 1.7–2.4)

## 2019-06-03 LAB — TROPONIN I (HIGH SENSITIVITY)
Troponin I (High Sensitivity): 186 ng/L (ref <18)
Troponin I (High Sensitivity): 182 ng/L (ref <18)
Troponin I (High Sensitivity): 187 ng/L (ref <18)

## 2019-06-03 LAB — COMPREHENSIVE METABOLIC PANEL
ALT: 26 U/L (ref 0–44)
AST: 40 U/L (ref 15–41)
Albumin: 2.9 g/dL — ABNORMAL LOW (ref 3.5–5.0)
Alkaline Phosphatase: 141 U/L — ABNORMAL HIGH (ref 38–126)
Anion gap: 11 (ref 5–15)
BUN: 34 mg/dL — ABNORMAL HIGH (ref 8–23)
CO2: 25 mmol/L (ref 22–32)
Calcium: 8.5 mg/dL — ABNORMAL LOW (ref 8.9–10.3)
Chloride: 106 mmol/L (ref 98–111)
Creatinine, Ser: 1.94 mg/dL — ABNORMAL HIGH (ref 0.44–1.00)
GFR calc Af Amer: 31 mL/min — ABNORMAL LOW (ref >60)
GFR calc non Af Amer: 27 mL/min — ABNORMAL LOW (ref >60)
Glucose, Bld: 151 mg/dL — ABNORMAL HIGH (ref 70–99)
Potassium: 3.5 mmol/L (ref 3.5–5.1)
Sodium: 142 mmol/L (ref 135–145)
Total Bilirubin: 1.2 mg/dL (ref 0.3–1.2)
Total Protein: 5.8 g/dL — ABNORMAL LOW (ref 6.5–8.1)

## 2019-06-03 LAB — PHOSPHORUS: Phosphorus: 2.1 mg/dL — ABNORMAL LOW (ref 2.5–4.6)

## 2019-06-03 MED ORDER — SODIUM CHLORIDE 0.9 % IV SOLN
250.0000 mL | INTRAVENOUS | Status: DC | PRN
  Administered 2019-06-05: 50 mL via INTRAVENOUS

## 2019-06-03 MED ORDER — SODIUM CHLORIDE 0.9% FLUSH: 3.0000 mL | INTRAVENOUS | Status: DC | PRN

## 2019-06-03 MED ORDER — ASCORBIC ACID 500 MG PO TABS
500.0000 mg | ORAL_TABLET | Freq: Every day | ORAL | Status: DC
  Administered 2019-06-03 – 2019-06-06 (×4): 500 mg via ORAL
  Filled 2019-06-03 (×4): qty 1

## 2019-06-03 MED ORDER — ASPIRIN 81 MG PO CHEW
81.0000 mg | CHEWABLE_TABLET | Freq: Every day | ORAL | Status: DC
  Administered 2019-06-03 – 2019-06-06 (×4): 81 mg via ORAL
  Filled 2019-06-03 (×4): qty 1

## 2019-06-03 MED ORDER — HYDRALAZINE HCL 20 MG/ML IJ SOLN: 10.0000 mg | Freq: Four times a day (QID) | INTRAMUSCULAR | Status: DC | PRN

## 2019-06-03 MED ORDER — ATORVASTATIN CALCIUM 20 MG PO TABS
20.0000 mg | ORAL_TABLET | Freq: Every day | ORAL | Status: DC
  Administered 2019-06-03 – 2019-06-05 (×3): 20 mg via ORAL
  Filled 2019-06-03 (×3): qty 1

## 2019-06-03 MED ORDER — IPRATROPIUM-ALBUTEROL 20-100 MCG/ACT IN AERS: 1.0000 | INHALATION_SPRAY | Freq: Four times a day (QID) | RESPIRATORY_TRACT | Status: DC

## 2019-06-03 MED ORDER — SACUBITRIL-VALSARTAN 49-51 MG PO TABS
1.0000 | ORAL_TABLET | Freq: Two times a day (BID) | ORAL | Status: DC
  Administered 2019-06-03 – 2019-06-06 (×6): 1 via ORAL
  Filled 2019-06-03 (×7): qty 1

## 2019-06-03 MED ORDER — FUROSEMIDE 10 MG/ML IJ SOLN
40.0000 mg | Freq: Once | INTRAMUSCULAR | Status: AC
  Administered 2019-06-03: 40 mg via INTRAVENOUS
  Filled 2019-06-03: qty 4

## 2019-06-03 MED ORDER — DOCUSATE SODIUM 100 MG PO CAPS
100.0000 mg | ORAL_CAPSULE | Freq: Every day | ORAL | Status: DC
  Administered 2019-06-03 – 2019-06-06 (×4): 100 mg via ORAL
  Filled 2019-06-03 (×4): qty 1

## 2019-06-03 MED ORDER — NITROGLYCERIN 0.4 MG SL SUBL: 0.4000 mg | SUBLINGUAL_TABLET | SUBLINGUAL | Status: DC | PRN

## 2019-06-03 MED ORDER — CARVEDILOL 3.125 MG PO TABS: 3.1250 mg | ORAL_TABLET | Freq: Two times a day (BID) | ORAL | Status: DC

## 2019-06-03 MED ORDER — LORAZEPAM 2 MG/ML IJ SOLN: 1.0000 mg | Freq: Once | INTRAMUSCULAR | Status: AC

## 2019-06-03 MED ORDER — FLUOXETINE HCL 20 MG PO CAPS
20.0000 mg | ORAL_CAPSULE | Freq: Every day | ORAL | Status: DC
  Administered 2019-06-04 – 2019-06-06 (×2): 20 mg via ORAL
  Filled 2019-06-03 (×4): qty 1

## 2019-06-03 MED ORDER — LORAZEPAM 2 MG/ML IJ SOLN
INTRAMUSCULAR | Status: AC
  Administered 2019-06-03: 1 mg via INTRAVENOUS
  Filled 2019-06-03: qty 1

## 2019-06-03 MED ORDER — RIVAROXABAN 20 MG PO TABS
20.0000 mg | ORAL_TABLET | Freq: Every day | ORAL | Status: DC
  Administered 2019-06-03 – 2019-06-05 (×3): 20 mg via ORAL
  Filled 2019-06-03 (×3): qty 1

## 2019-06-03 MED ORDER — CARVEDILOL 12.5 MG PO TABS
12.5000 mg | ORAL_TABLET | Freq: Two times a day (BID) | ORAL | Status: DC
  Administered 2019-06-03 – 2019-06-04 (×3): 12.5 mg via ORAL
  Filled 2019-06-03 (×3): qty 1

## 2019-06-03 MED ORDER — SODIUM CHLORIDE 0.9% FLUSH
3.0000 mL | Freq: Two times a day (BID) | INTRAVENOUS | Status: DC
  Administered 2019-06-03 – 2019-06-06 (×6): 3 mL via INTRAVENOUS

## 2019-06-03 MED ORDER — IPRATROPIUM-ALBUTEROL 0.5-2.5 (3) MG/3ML IN SOLN
3.0000 mL | Freq: Four times a day (QID) | RESPIRATORY_TRACT | Status: DC
  Administered 2019-06-03 – 2019-06-04 (×3): 3 mL via RESPIRATORY_TRACT
  Filled 2019-06-03 (×4): qty 3

## 2019-06-03 MED ORDER — ACETAMINOPHEN 325 MG PO TABS
650.0000 mg | ORAL_TABLET | ORAL | Status: DC | PRN
  Administered 2019-06-03 – 2019-06-05 (×2): 650 mg via ORAL
  Filled 2019-06-03 (×2): qty 2

## 2019-06-03 MED ORDER — ONDANSETRON HCL 4 MG/2ML IJ SOLN: 4.0000 mg | Freq: Four times a day (QID) | INTRAMUSCULAR | Status: DC | PRN

## 2019-06-03 MED ORDER — LORATADINE 10 MG PO TABS: 10.0000 mg | ORAL_TABLET | Freq: Every day | ORAL | Status: DC | PRN

## 2019-06-03 MED ORDER — FUROSEMIDE 10 MG/ML IJ SOLN
40.0000 mg | Freq: Two times a day (BID) | INTRAMUSCULAR | Status: DC
  Administered 2019-06-03 (×2): 40 mg via INTRAVENOUS
  Filled 2019-06-03 (×2): qty 4

## 2019-06-03 MED ORDER — PANTOPRAZOLE SODIUM 40 MG PO TBEC
40.0000 mg | DELAYED_RELEASE_TABLET | Freq: Every day | ORAL | Status: DC
  Administered 2019-06-03 – 2019-06-06 (×4): 40 mg via ORAL
  Filled 2019-06-03 (×4): qty 1

## 2019-06-03 MED ORDER — ASPIRIN 81 MG PO CHEW
324.0000 mg | CHEWABLE_TABLET | Freq: Once | ORAL | Status: DC
  Filled 2019-06-03: qty 4

## 2019-06-03 NOTE — Progress Notes (Signed)
MD notified. Pt has 9 beats of wide QRS v-tach per CCMD. MD orders stat labs. I will continue to assess.

## 2019-06-03 NOTE — ED Triage Notes (Signed)
Pt to ED via EMS from home. Pt called out for shortness of breath and chest pain. Pt states she has a heaviness in the center of her chest. Pt was able to ambulate to ambulance. On arrival pt states she feels better.

## 2019-06-03 NOTE — Consult Note (Signed)
CARDIOLOGY CONSULT NOTE               Patient ID: Katelyn Lane MRN: [id number] DOB/AGE: May 04, 1955 64 y.o.  Admit date: 06/03/2019 Referring Physician Dr. Francine Graven hospitalist Primary Physician Dr Elyse Jarvis Primary Cardiologist [facility] Reason for Consultation Shortness of Breath  HPI: Patient is 64 year old female with multiple ER visits history of congestive heart failure systolic dysfunction severe cardiomyopathy COPD hypertension CVA substance abuse noncompliance presented with significant shortness of breath as well as hypertensive urgency there are some concern about compliance.  Patient also described some chest heaviness 8 out of 10 she is also had PND and orthopnea and lower extremity edema no fever chills or sweats no nausea vomiting or diarrhea chest x-ray suggested congestion EKG just showed sinus rhythm and left bundle branch block unchanged mildly elevated troponins and BNP patient now here for further evaluation she is multiple hospitalization with probably related to noncompliance  Review of systems complete and found to be negative unless listed above     Past Medical History:  Diagnosis Date  . CHF (congestive heart failure) ([place])   . COVID-19   . Hyperlipidemia   . Hypertension   . Renal disorder     Past Surgical History:  Procedure Laterality Date  . RIGHT/LEFT HEART CATH AND CORONARY ANGIOGRAPHY N/A 12/30/2018   Procedure: RIGHT/LEFT HEART CATH AND CORONARY ANGIOGRAPHY;  Surgeon: Corey Skains, MD;  Location: [facility];  Service: Cardiovascular;  Laterality: N/A;    Medications Prior to Admission  Medication Sig Dispense Refill Last Dose  . ascorbic acid (VITAMIN C) 500 MG tablet Take 500 mg by mouth daily.   Unknown at Unknown  . aspirin 81 MG chewable tablet Chew 1 tablet (81 mg total) by mouth daily. 30 tablet 0 Unknown at Unknown  . atorvastatin (LIPITOR) 20 MG tablet Take 1 tablet (20 mg total) by mouth daily at 6 PM. 30 tablet  2 Unknown at Unknown  . carvedilol (COREG) 3.125 MG tablet Take 1 tablet (3.125 mg total) by mouth 2 (two) times daily with a meal. 60 tablet 2 Unknown at Unknown  . docusate calcium (SURFAK) 240 MG capsule Take 240 mg by mouth daily as needed for moderate constipation.    Unknown at Unknown  . FLUoxetine (PROZAC) 20 MG capsule Take 1 capsule (20 mg total) by mouth daily. 60 capsule 3 Unknown at Unknown  . furosemide (LASIX) 40 MG tablet Take 2 tablets (80 mg total) by mouth daily. 60 tablet 2 Unknown at Unknown  . Ipratropium-Albuterol (COMBIVENT) 20-100 MCG/ACT AERS respimat Inhale 1 puff into the lungs every 6 (six) hours. 4 g 0 Unknown at Unknown  . loratadine (CLARITIN) 10 MG tablet Take 10 mg by mouth daily as needed for allergies.    Unknown at Unknown  . nitroGLYCERIN (NITROSTAT) 0.4 MG SL tablet Place 1 tablet (0.4 mg total) under the tongue every 5 (five) minutes as needed for chest pain. 30 tablet 1 prn at prn  . pantoprazole (PROTONIX) 40 MG tablet Take 1 tablet (40 mg total) by mouth daily. 30 tablet 0 Unknown at Unknown  . rivaroxaban (XARELTO) 20 MG TABS tablet Take 1 tablet (20 mg total) by mouth daily with supper. For the blood clot in your heart. 30 tablet 2 Unknown at Unknown  . sacubitril-valsartan (ENTRESTO) 49-51 MG Take 1 tablet by mouth 2 (two) times daily. 60 tablet 0 Unknown at Unknown  . albuterol (VENTOLIN HFA) 108 (90 Base) MCG/ACT inhaler Inhale 2 puffs  into the lungs every 6 (six) hours as needed for wheezing or shortness of breath.   Not Taking at Unknown time   Social History   Socioeconomic History  . Marital status: Single    Spouse name: Not on file  . Number of children: Not on file  . Years of education: Not on file  . Highest education level: Not on file  Occupational History  . Not on file  Tobacco Use  . Smoking status: Current Every Day Smoker  . Smokeless tobacco: Never Used  Substance and Sexual Activity  . Alcohol use: No    Alcohol/week: 0.0  standard drinks  . Drug use: Yes    Types: Cocaine    Comment: 4-5 days ago  . Sexual activity: Not on file  Other Topics Concern  . Not on file  Social History Narrative  . Not on file   Social Determinants of Health   Financial Resource Strain:   . Difficulty of Paying Living Expenses:   Food Insecurity:   . Worried About Charity fundraiser in the Last Year:   . Arboriculturist in the Last Year:   Transportation Needs:   . Film/video editor (Medical):   Marland Kitchen Lack of Transportation (Non-Medical):   Physical Activity:   . Days of Exercise per Week:   . Minutes of Exercise per Session:   Stress:   . Feeling of Stress :   Social Connections:   . Frequency of Communication with Friends and Family:   . Frequency of Social Gatherings with Friends and Family:   . Attends Religious Services:   . Active Member of Clubs or Organizations:   . Attends Archivist Meetings:   Marland Kitchen Marital Status:   Intimate Partner Violence:   . Fear of Current or Ex-Partner:   . Emotionally Abused:   Marland Kitchen Physically Abused:   . Sexually Abused:     Family History  Problem Relation Age of Onset  . Breast cancer Neg Hx       Review of systems complete and found to be negative unless listed above      PHYSICAL EXAM  General: Well developed, well nourished, in no acute distress HEENT:  Normocephalic and atramatic Neck:  No JVD.  Lungs: Clear bilaterally to auscultation and percussion. Heart: HRRR . Normal S1 and S2 without gallops or murmurs.  Abdomen: Bowel sounds are positive, abdomen soft and non-tender  Msk:  Back normal, normal gait. Normal strength and tone for age. Extremities: No clubbing, cyanosis or edema.   Neuro: Alert and oriented X 3. Psych:  Good affect, responds appropriately  Labs:   Lab Results  Component Value Date   WBC 5.7 06/03/2019   HGB 10.4 (L) 06/03/2019   HCT 35.5 (L) 06/03/2019   MCV 77.7 (L) 06/03/2019   PLT 239 06/03/2019    Recent Labs    Lab 06/03/19 0403  NA 142  K 3.5  CL 106  CO2 25  BUN 34*  CREATININE 1.94*  CALCIUM 8.5*  PROT 5.8*  BILITOT 1.2  ALKPHOS 141*  ALT 26  AST 40  GLUCOSE 151*   No results found for: CKTOTAL, CKMB, CKMBINDEX, TROPONINI  Lab Results  Component Value Date   CHOL 100 05/22/2019   CHOL 116 04/30/2019   CHOL 126 03/07/2019   Lab Results  Component Value Date   HDL 46 05/22/2019   HDL 41 04/30/2019   HDL 32 (L) 03/07/2019   Lab Results  Component Value Date   LDLCALC 43 05/22/2019   LDLCALC 58 04/30/2019   LDLCALC 50 03/07/2019   Lab Results  Component Value Date   TRIG 56 05/22/2019   TRIG 86 04/30/2019   TRIG 222 (H) 03/07/2019   TRIG 232 (H) 03/07/2019   Lab Results  Component Value Date   CHOLHDL 2.2 05/22/2019   CHOLHDL 2.8 04/30/2019   CHOLHDL 3.9 03/07/2019   No results found for: LDLDIRECT    Radiology: DG Chest 2 View  Result Date: 05/21/2019 CLINICAL DATA:  Short of breath EXAM: CHEST - 2 VIEW COMPARISON:  05/20/2019 FINDINGS: Marked cardiac enlargement unchanged. Pulmonary vascular congestion unchanged. Bibasilar airspace disease left greater than right unchanged. No significant pleural effusion. IMPRESSION: Cardiac enlargement with vascular congestion unchanged. Bibasilar atelectasis/infiltrate left greater than right is unchanged. Electronically Signed   By: Franchot Gallo M.D.   On: 05/21/2019 15:31   DG Chest 2 View  Result Date: 05/20/2019 CLINICAL DATA:  Shortness of breath, history CHF, dilated cardiomyopathy, COVID-19, hypertension EXAM: CHEST - 2 VIEW COMPARISON:  Portable exam of 04/29/2019 FINDINGS: Enlargement of cardiac silhouette with pulmonary vascular congestion. Mediastinal contours normal. Minimal interstitial prominence diffusely throughout both lungs suspect representing mild chronic failure. No definite acute infiltrate, pleural effusion or pneumothorax. IMPRESSION: Enlargement of cardiac silhouette with pulmonary vascular congestion  and minimal chronic failure. No acute abnormalities. Electronically Signed   By: Lavonia Dana M.D.   On: 05/20/2019 19:50   CT HEAD WO CONTRAST  Result Date: 05/21/2019 CLINICAL DATA:  Encephalopathy EXAM: CT HEAD WITHOUT CONTRAST TECHNIQUE: Contiguous axial images were obtained from the base of the skull through the vertex without intravenous contrast. COMPARISON:  01/17/2019 FINDINGS: Brain: No acute intracranial abnormality. Specifically, no hemorrhage, hydrocephalus, mass lesion, acute infarction, or significant intracranial injury. Vascular: No hyperdense vessel or unexpected calcification. Skull: No acute calvarial abnormality. Sinuses/Orbits: Visualized paranasal sinuses and mastoids clear. Orbital soft tissues unremarkable. Other: None IMPRESSION: Normal study. Electronically Signed   By: Rolm Baptise M.D.   On: 05/21/2019 22:58   MR BRAIN WO CONTRAST  Result Date: 05/23/2019 CLINICAL DATA:  Altered mental status of unknown etiology. Heart failure. Negative head CT 2 days ago. EXAM: MRI HEAD WITHOUT CONTRAST TECHNIQUE: Multiplanar, multiecho pulse sequences of the brain and surrounding structures were obtained without intravenous contrast. COMPARISON:  Head CT 05/21/2019.  MRI 01/17/2019. FINDINGS: Brain: Diffusion imaging does not show any acute or subacute insult affecting the brainstem, cerebellum or right hemisphere. Previously seen acute infarction at the left frontoparietal junction in the cortical and subcortical brain has lost its restricted diffusion as expected, but there is a small area of new involvement just deep to the previous acute infarction within the underlying white matter. This is consistent with minor acute to subacute extension of the previously seen insult. No new area of acute insult. Only diffusion imaging was done as requested. Vascular: Major vessels at the base of the brain show flow. Skull and upper cervical spine: Negative Sinuses/Orbits: Clear/normal Other: None  IMPRESSION: In January, the patient had an area of acute/subacute infarction at the left frontoparietal junction cortical and subcortical brain. That area has lost its restricted diffusion as expected with normal evolutionary change. There is low level restricted diffusion now seen in the immediately adjacent underlying subcortical white matter, probably representing a minor extension of the insult. Electronically Signed   By: Nelson Chimes M.D.   On: 05/23/2019 11:49   DG Chest Port 1 View  Result Date: 06/03/2019 CLINICAL  DATA:  Dyspnea EXAM: PORTABLE CHEST 1 VIEW COMPARISON:  Radiograph 05/21/2019 FINDINGS: Low lung volumes with gradient opacity towards the bases likely accentuated by atelectatic change and patient body habitus. There is some mild central vascular congestion with hazy interstitial opacities and fissural thickening. No pneumothorax or visible effusion. Cardiomegaly is similar to prior counting for differences in inflation in technique. No acute osseous or soft tissue abnormality. Telemetry leads overlie the chest. IMPRESSION: Cardiomegaly with central vascular congestion. Increasing interstitial opacity could reflect developing interstitial edema. Additional atelectatic changes in the lung bases. Electronically Signed   By: Lovena Le M.D.   On: 06/03/2019 04:50    EKG: Normal sinus rhythm left bundle branch block rate of 70  ASSESSMENT AND PLAN:  Shortness of breath Congestive heart failure systolic dysfunction Severe cardiomyopathy ejection fraction around 20% Hypertensive urgency Elevated troponin demand ischemia Chronic renal insufficiency stage III Cardiac thrombus Depression Mild obesity Noncompliance . Plan Agree with admit to telemetry Follow-up EKGs troponin Continue blood pressure management and control Consider nephrology for evaluation of renal insufficiency Continue Xarelto for cardiac thrombus Aggressive blood pressure management and control Continue  heart failure management Do not recommend an invasive strategy Agree with Entresto spironolactone Coreg for heart failure    Signed: Yolonda Kida MD 06/03/2019, 7:48 PM

## 2019-06-03 NOTE — ED Provider Notes (Signed)
[facility] Emergency Department Provider Note  ____________________________________________   First MD Initiated Contact with Patient 06/03/19 [phone]     (approximate)  I have reviewed the triage vital signs and the nursing notes.   HISTORY  Chief Complaint Shortness of Breath    HPI Katelyn Lane is a 64 y.o. female with below list of previous medical conditions including congestive heart failure hypertension COPD crack cocaine use CVA presents to the emergency department via EMS secondary to dyspnea and chest pain.  Patient describes pain as heaviness in the center of her chest with current pain score of 8 out of 10.  Patient very restless, agitated on arrival.  Patient does admit to crack cocaine use tonight.  Patient also admits to lower extremity swelling bilaterally.       Past Medical History:  Diagnosis Date  . CHF (congestive heart failure) ([place])   . COVID-19   . Hyperlipidemia   . Hypertension   . Renal disorder     Patient Active Problem List   Diagnosis Date Noted  . Accelerated hypertension   . Non-sustained ventricular tachycardia ([place])   . COPD with acute exacerbation ([place])   . Pulmonary hypertension ([place])   . Acute metabolic encephalopathy 55/73/2202  . History of substance abuse ([place]) 05/21/2019  . CHF (congestive heart failure) ([place]) 05/21/2019  . Acute systolic CHF (congestive heart failure) ([place]) 04/08/2019  . Acute on chronic combined systolic and diastolic CHF (congestive heart failure) ([place]) 03/24/2019  . COVID-19 03/06/2019  . Acute respiratory failure with hypoxia ([place]) 03/06/2019  . Hypokalemia 03/06/2019  . LV (left ventricular) mural thrombus 03/06/2019  . Chronic renal failure, stage 3b 03/06/2019  . Nausea and vomiting 02/16/2019  . Abdominal pain 02/16/2019  . Rhabdomyolysis 02/16/2019  . Acute renal failure superimposed on stage 3 chronic kidney disease ([place]) 02/16/2019  . Crack cocaine use 02/16/2019  .  Tobacco abuse 02/16/2019  . Acute on chronic systolic CHF (congestive heart failure) ([place]) 02/16/2019  . Acute congestive heart failure ([place]) 02/13/2019  . Respiratory failure with hypoxia ([place]) 01/31/2019  . Stroke ([place]) 01/18/2019  . Dilated cardiomyopathy ([place]) 12/31/2018  . Chest tightness 12/31/2018  . Elevated troponin 12/31/2018  . Hypertensive urgency 12/31/2018  . Hyperlipidemia 12/31/2018  . Depression 12/31/2018  . HFrEF (heart failure with reduced ejection fraction) ([place]) 12/29/2018    Past Surgical History:  Procedure Laterality Date  . RIGHT/LEFT HEART CATH AND CORONARY ANGIOGRAPHY N/A 12/30/2018   Procedure: RIGHT/LEFT HEART CATH AND CORONARY ANGIOGRAPHY;  Surgeon: Corey Skains, MD;  Location: [facility];  Service: Cardiovascular;  Laterality: N/A;    Prior to Admission medications   Medication Sig Start Date End Date Taking? Authorizing Provider  albuterol (VENTOLIN HFA) 108 (90 Base) MCG/ACT inhaler Inhale 2 puffs into the lungs every 6 (six) hours as needed for wheezing or shortness of breath.    [provider]  ascorbic acid (VITAMIN C) 500 MG tablet Take 500 mg by mouth daily.    [provider]  aspirin 81 MG chewable tablet Chew 1 tablet (81 mg total) by mouth daily. 05/03/19   Charlynne Cousins, MD  atorvastatin (LIPITOR) 20 MG tablet Take 1 tablet (20 mg total) by mouth daily at 6 PM. 05/03/19 08/01/19  Charlynne Cousins, MD  carvedilol (COREG) 3.125 MG tablet Take 1 tablet (3.125 mg total) by mouth 2 (two) times daily with a meal. 05/03/19 08/01/19  Charlynne Cousins, MD  docusate calcium ([place])  240 MG capsule Take 240 mg by mouth daily as needed for moderate constipation.     [provider]  FLUoxetine (PROZAC) 20 MG capsule Take 1 capsule (20 mg total) by mouth daily. 05/03/19   Charlynne Cousins, MD  furosemide (LASIX) 40 MG tablet Take 2 tablets (80 mg total) by mouth daily. 05/03/19 08/01/19  Charlynne Cousins, MD  Ipratropium-Albuterol (COMBIVENT) 20-100 MCG/ACT AERS respimat Inhale 1 puff into the lungs every 6 (six) hours. 05/26/19   Loletha Grayer, MD  loratadine (CLARITIN) 10 MG tablet Take 10 mg by mouth daily as needed for allergies.     [provider]  nitroGLYCERIN (NITROSTAT) 0.4 MG SL tablet Place 1 tablet (0.4 mg total) under the tongue every 5 (five) minutes as needed for chest pain. 05/03/19   Charlynne Cousins, MD  pantoprazole (PROTONIX) 40 MG tablet Take 1 tablet (40 mg total) by mouth daily. 05/03/19   Charlynne Cousins, MD  rivaroxaban (XARELTO) 20 MG TABS tablet Take 1 tablet (20 mg total) by mouth daily with supper. For the blood clot in your heart. 05/03/19 08/01/19  Charlynne Cousins, MD  sacubitril-valsartan (ENTRESTO) 49-51 MG Take 1 tablet by mouth 2 (two) times daily. 05/26/19   Loletha Grayer, MD    Allergies Penicillins and Ace inhibitors  Family History  Problem Relation Age of Onset  . Breast cancer Neg Hx     Social History Social History   Tobacco Use  . Smoking status: Current Every Day Smoker  . Smokeless tobacco: Never Used  Substance Use Topics  . Alcohol use: No    Alcohol/week: 0.0 standard drinks  . Drug use: Yes    Types: Cocaine    Comment: 4-5 days ago    Review of Systems Constitutional: No fever/chills Eyes: No visual changes. ENT: No sore throat. Cardiovascular: Positive for chest pain. Respiratory: Positive for shortness of breath. Gastrointestinal: No abdominal pain.  No nausea, no vomiting.  No diarrhea.  No constipation. Genitourinary: Negative for dysuria. Musculoskeletal: Negative for neck pain.  Negative for back pain.  Positive for lower extremity swelling Integumentary: Negative for rash. Neurological: Negative for headaches, focal weakness or numbness.   ____________________________________________   PHYSICAL EXAM:  VITAL SIGNS: ED Triage Vitals  Enc Vitals Group     BP 06/03/19 0402 (!)  136/95     Pulse Rate 06/03/19 0359 82     Resp 06/03/19 0359 17     Temp 06/03/19 0359 98.7 F (37.1 C)     Temp Source 06/03/19 0359 Oral     SpO2 06/03/19 0359 100 %     Weight 06/03/19 0357 102.1 kg (225 lb 1.4 oz)     Height 06/03/19 0357 1.753 m (5\' 9" )     Head Circumference --      Peak Flow --      Pain Score 06/03/19 0356 8     Pain Loc --      Pain Edu? --      Excl. in [place]? --     Constitutional: Alert and oriented.  Agitated restless Eyes: Conjunctivae are normal.  Head: Atraumatic. Mouth/Throat: Patient is wearing a mask. Neck: No stridor.  No meningeal signs.   Cardiovascular: Normal rate, regular rhythm. Good peripheral circulation. Grossly normal heart sounds. Respiratory: Normal respiratory effort.  No retractions. Gastrointestinal: Soft and nontender. No distention.  Musculoskeletal: No lower extremity tenderness nor edema. No gross deformities of extremities. Neurologic:  Normal speech and language. No gross  focal neurologic deficits are appreciated.  Skin:  Skin is warm, dry and intact. Psychiatric: Mood and affect are normal. Speech and behavior are normal.  ____________________________________________   LABS (all labs ordered are listed, but only abnormal results are displayed)  Labs Reviewed  CBC - Abnormal; Notable for the following components:      Result Value   Hemoglobin 10.4 (*)    HCT 35.5 (*)    MCV 77.7 (*)    MCH 22.8 (*)    MCHC 29.3 (*)    RDW 21.4 (*)    All other components within normal limits  COMPREHENSIVE METABOLIC PANEL - Abnormal; Notable for the following components:   Glucose, Bld 151 (*)    BUN 34 (*)    Creatinine, Ser 1.94 (*)    Calcium 8.5 (*)    Total Protein 5.8 (*)    Albumin 2.9 (*)    Alkaline Phosphatase 141 (*)    GFR calc non Af Amer 27 (*)    GFR calc Af Amer 31 (*)    All other components within normal limits  TROPONIN I (HIGH SENSITIVITY) - Abnormal; Notable for the following components:   Troponin I  (High Sensitivity) 186 (*)    All other components within normal limits  BRAIN NATRIURETIC PEPTIDE   ____________________________________________  EKG  ED ECG REPORT I, [place] N Shaylah Mcghie, the attending physician, personally viewed and interpreted this ECG.   Date: 06/03/2019  EKG Time: 4:01 AM  Rate: 81  Rhythm: Normal sinus rhythm  Axis: Normal  Intervals: Normal  ST&T Change: None  ____________________________________________  RADIOLOGY I, [place] [facility] Meylin Stenzel, personally viewed and evaluated these images (plain radiographs) as part of my medical decision making, as well as reviewing the written report by the radiologist.  ED MD interpretation: Already megaly with central vascular congestion increased interstitial opacities consistent with an interstitial edema   Official radiology report(s): DG Chest Port 1 View  Result Date: 06/03/2019 CLINICAL DATA:  Dyspnea EXAM: PORTABLE CHEST 1 VIEW COMPARISON:  Radiograph 05/21/2019 FINDINGS: Low lung volumes with gradient opacity towards the bases likely accentuated by atelectatic change and patient body habitus. There is some mild central vascular congestion with hazy interstitial opacities and fissural thickening. No pneumothorax or visible effusion. Cardiomegaly is similar to prior counting for differences in inflation in technique. No acute osseous or soft tissue abnormality. Telemetry leads overlie the chest. IMPRESSION: Cardiomegaly with central vascular congestion. Increasing interstitial opacity could reflect developing interstitial edema. Additional atelectatic changes in the lung bases. Electronically Signed   By: Lovena Le M.D.   On: 06/03/2019 04:50    ____________________________________________    Procedures   ____________________________________________   INITIAL IMPRESSION / MDM / [place] / ED COURSE  As part of my medical decision making, I reviewed the following data within the electronic medical  record:   64 year old female presented with above-stated history and physical exam with differential diagnosis including but not limited to ACS, pulmonary edema, pulmonary emboli, cocaine toxicity, demand ischemia.  Patient's laboratory data notable for troponin of 186 and a BNP greater than 4500 chest x-ray revealed cardiomegaly with central vascular congestion and Inc. recent interstitial opacity reflecting developing interstitial edema.  Patient given 1 mg of IV Ativan secondary to agitation with the patient resting comfortably afterwards.  Patient given Lasix 40 mg IV.  Patient discussed with Dr. Gayla Medicus for hospital admission for further evaluation and management of acute interstitial edema and elevated troponin.  ____________________________________________  FINAL CLINICAL IMPRESSION(S) /  ED DIAGNOSES  Final diagnoses:  Acute on chronic congestive heart failure, unspecified heart failure type ([place])     MEDICATIONS GIVEN DURING THIS VISIT:  Medications  LORazepam (ATIVAN) injection 1 mg (1 mg Intravenous Given 06/03/19 0458)     ED Discharge Orders    None      *Please note:  Somaly Marteney was evaluated in Emergency Department on 06/03/2019 for the symptoms described in the history of present illness. She was evaluated in the context of the global COVID-19 pandemic, which necessitated consideration that the patient might be at risk for infection with the SARS-CoV-2 virus that causes COVID-19. Institutional protocols and algorithms that pertain to the evaluation of patients at risk for COVID-19 are in a state of rapid change based on information released by regulatory bodies including the CDC and federal and state organizations. These policies and algorithms were followed during the patient's care in the ED.  Some ED evaluations and interventions may be delayed as a result of limited staffing during the pandemic.*  Note:  This document was prepared using Dragon voice recognition software  and may include unintentional dictation errors.   Gregor Hams, MD 06/03/19 [phone]

## 2019-06-03 NOTE — Progress Notes (Signed)
RN and NT attempt 2 times to obtain REDs Vest reading. Pt refused both times. I will continue to assess.

## 2019-06-03 NOTE — H&P (Signed)
History and Physical    Katelyn Lane LHT:[id number] DOB: [birth year] DOA: 06/03/2019  PCP: [name], MD   Patient coming from: Home  I have personally briefly reviewed patient's old medical records in [place]  Chief Complaint: Shortness of breath  HPI: Katelyn Lane is a 64 y.o. female with medical history significant for chronic combined systolic and diastolic CHF with a last known LVEF of 20%, hypertension, COPD, CVA and history of substance abuse who presents to the emergency room via EMS for evaluation of worsening shortness of breath associated with chest pain.  She describes the chest pain as heaviness in the center of her chest and rated it an 8 x 10 in intensity at its worst.  Shortness of breath is associated with orthopnea and paroxysmal nocturnal dyspnea as well as lower extremity edema.  She denies having any fever or chills, abdominal pain, nausea, vomiting or any changes in her bowel habits.  She denies having any urinary symptoms. Chest x-ray showed cardiomegaly with central vascular congestion. Increasing interstitial opacity could reflect developing interstitial edema. Twelve-lead EKG shows sinus rhythm with a left bundle branch block. Troponin and BNP was elevated   ED Course: Patient seen in the emergency room for evaluation of shortness of breath and chest pain.  She has a history of substance abuse.  She will be admitted to the hospital for acute on chronic CHF.  Review of Systems: As per HPI otherwise 10 point review of systems negative.    Past Medical History:  Diagnosis Date  . CHF (congestive heart failure) ([place])   . COVID-19   . Hyperlipidemia   . Hypertension   . Renal disorder     Past Surgical History:  Procedure Laterality Date  . RIGHT/LEFT HEART CATH AND CORONARY ANGIOGRAPHY N/A 12/30/2018   Procedure: RIGHT/LEFT HEART CATH AND CORONARY ANGIOGRAPHY;  Surgeon: Corey Skains, MD;  Location: [facility];   Service: Cardiovascular;  Laterality: N/A;     reports that she has been smoking. She has never used smokeless tobacco. She reports current drug use. Drug: Cocaine. She reports that she does not drink alcohol.  Allergies  Allergen Reactions  . Penicillins Hives, Rash and Other (See Comments)    Did it involve swelling of the face/tongue/throat, SOB, or low BP? No Did it involve sudden or severe rash/hives, skin peeling, or any reaction on the inside of your mouth or nose? Yes Did you need to seek medical attention at a hospital or doctor's office? Yes When did it last happen? >25 years If all above answers are "NO", may proceed with cephalosporin use.   . Ace Inhibitors Nausea And Vomiting    Family History  Problem Relation Age of Onset  . Breast cancer Neg Hx      Prior to Admission medications   Medication Sig Start Date End Date Taking? Authorizing Provider  ascorbic acid (VITAMIN C) 500 MG tablet Take 500 mg by mouth daily.   Yes [provider]  aspirin 81 MG chewable tablet Chew 1 tablet (81 mg total) by mouth daily. 05/03/19  Yes Charlynne Cousins, MD  atorvastatin (LIPITOR) 20 MG tablet Take 1 tablet (20 mg total) by mouth daily at 6 PM. 05/03/19 08/01/19 Yes Charlynne Cousins, MD  carvedilol (COREG) 3.125 MG tablet Take 1 tablet (3.125 mg total) by mouth 2 (two) times daily with a meal. 05/03/19 08/01/19 Yes Charlynne Cousins, MD  docusate calcium (SURFAK) 240 MG capsule Take 240 mg  by mouth daily as needed for moderate constipation.    Yes [provider]  FLUoxetine (PROZAC) 20 MG capsule Take 1 capsule (20 mg total) by mouth daily. 05/03/19  Yes Charlynne Cousins, MD  furosemide (LASIX) 40 MG tablet Take 2 tablets (80 mg total) by mouth daily. 05/03/19 08/01/19 Yes Charlynne Cousins, MD  Ipratropium-Albuterol (COMBIVENT) 20-100 MCG/ACT AERS respimat Inhale 1 puff into the lungs every 6 (six) hours. 05/26/19  Yes Wieting, Richard, MD    loratadine (CLARITIN) 10 MG tablet Take 10 mg by mouth daily as needed for allergies.    Yes [provider]  nitroGLYCERIN (NITROSTAT) 0.4 MG SL tablet Place 1 tablet (0.4 mg total) under the tongue every 5 (five) minutes as needed for chest pain. 05/03/19  Yes Charlynne Cousins, MD  pantoprazole (PROTONIX) 40 MG tablet Take 1 tablet (40 mg total) by mouth daily. 05/03/19  Yes Charlynne Cousins, MD  rivaroxaban (XARELTO) 20 MG TABS tablet Take 1 tablet (20 mg total) by mouth daily with supper. For the blood clot in your heart. 05/03/19 08/01/19 Yes Charlynne Cousins, MD  sacubitril-valsartan (ENTRESTO) 49-51 MG Take 1 tablet by mouth 2 (two) times daily. 05/26/19  Yes Wieting, Richard, MD  albuterol (VENTOLIN HFA) 108 (90 Base) MCG/ACT inhaler Inhale 2 puffs into the lungs every 6 (six) hours as needed for wheezing or shortness of breath.    [provider]    Physical Exam: Vitals:   06/03/19 0515 06/03/19 0545 06/03/19 0649 06/03/19 0741  BP:   (!) 155/116 (!) 159/123  Pulse: 80 84 (!) 108 (!) 57  Resp:    20  Temp:   98.1 F (36.7 C) 97.8 F (36.6 C)  TempSrc:   Oral Oral  SpO2: 94% 96% 97% 91%  Weight:      Height:         Vitals:   06/03/19 0515 06/03/19 0545 06/03/19 0649 06/03/19 0741  BP:   (!) 155/116 (!) 159/123  Pulse: 80 84 (!) 108 (!) 57  Resp:    20  Temp:   98.1 F (36.7 C) 97.8 F (36.6 C)  TempSrc:   Oral Oral  SpO2: 94% 96% 97% 91%  Weight:      Height:        Constitutional: NAD, alert and oriented x 3. Has conversational dyspnea Eyes: PERRL, lids and conjunctivae normal, pale conjunctiva ENMT: Mucous membranes are moist.  Neck: normal, supple, no masses, no thyromegaly Respiratory: decreased air entry at the bases, scattered wheezes, no crackles. Normal respiratory effort. No accessory muscle use.  Cardiovascular: Regular rate and rhythm, no murmurs / rubs / gallops. 2+ extremity edema. 2+ pedal pulses. No carotid bruits.   Abdomen: no tenderness, no masses palpated. No hepatosplenomegaly. Bowel sounds positive. Central adiposity Musculoskeletal: no clubbing / cyanosis. No joint deformity upper and lower extremities.  Skin: no rashes, lesions, ulcers.  Neurologic: No gross focal neurologic deficit. Psychiatric: Normal mood and affect.   Labs on Admission: I have personally reviewed following labs and imaging studies  CBC: Recent Labs  Lab 06/03/19 0403  WBC 5.7  HGB 10.4*  HCT 35.5*  MCV 77.7*  PLT 673   Basic Metabolic Panel: Recent Labs  Lab 06/03/19 0403  NA 142  K 3.5  CL 106  CO2 25  GLUCOSE 151*  BUN 34*  CREATININE 1.94*  CALCIUM 8.5*   GFR: Estimated Creatinine Clearance: 37.8 mL/min (A) (by C-G formula based on SCr of 1.94  mg/dL (H)). Liver Function Tests: Recent Labs  Lab 06/03/19 0403  AST 40  ALT 26  ALKPHOS 141*  BILITOT 1.2  PROT 5.8*  ALBUMIN 2.9*   No results for input(s): LIPASE, AMYLASE in the last 168 hours. No results for input(s): AMMONIA in the last 168 hours. Coagulation Profile: No results for input(s): INR, PROTIME in the last 168 hours. Cardiac Enzymes: No results for input(s): CKTOTAL, CKMB, CKMBINDEX, TROPONINI in the last 168 hours. BNP (last 3 results) No results for input(s): PROBNP in the last 8760 hours. HbA1C: No results for input(s): HGBA1C in the last 72 hours. CBG: No results for input(s): GLUCAP in the last 168 hours. Lipid Profile: No results for input(s): CHOL, HDL, LDLCALC, TRIG, CHOLHDL, LDLDIRECT in the last 72 hours. Thyroid Function Tests: No results for input(s): TSH, T4TOTAL, FREET4, T3FREE, THYROIDAB in the last 72 hours. Anemia Panel: No results for input(s): VITAMINB12, FOLATE, FERRITIN, TIBC, IRON, RETICCTPCT in the last 72 hours. Urine analysis:    Component Value Date/Time   COLORURINE YELLOW (A) 05/21/2019 2116   APPEARANCEUR HAZY (A) 05/21/2019 2116   LABSPEC 1.010 05/21/2019 2116   PHURINE 7.0 05/21/2019 2116    GLUCOSEU NEGATIVE 05/21/2019 2116   HGBUR NEGATIVE 05/21/2019 2116   BILIRUBINUR NEGATIVE 05/21/2019 2116   [place] NEGATIVE 05/21/2019 2116   PROTEINUR 30 (A) 05/21/2019 2116   NITRITE NEGATIVE 05/21/2019 2116   LEUKOCYTESUR NEGATIVE 05/21/2019 2116    Radiological Exams on Admission: DG Chest Port 1 View  Result Date: 06/03/2019 CLINICAL DATA:  Dyspnea EXAM: PORTABLE CHEST 1 VIEW COMPARISON:  Radiograph 05/21/2019 FINDINGS: Low lung volumes with gradient opacity towards the bases likely accentuated by atelectatic change and patient body habitus. There is some mild central vascular congestion with hazy interstitial opacities and fissural thickening. No pneumothorax or visible effusion. Cardiomegaly is similar to prior counting for differences in inflation in technique. No acute osseous or soft tissue abnormality. Telemetry leads overlie the chest. IMPRESSION: Cardiomegaly with central vascular congestion. Increasing interstitial opacity could reflect developing interstitial edema. Additional atelectatic changes in the lung bases. Electronically Signed   By: Lovena Le M.D.   On: 06/03/2019 04:50    EKG: Independently reviewed.  Sinus rhythm Left bundle branch block  Assessment/Plan Principal Problem:   Acute on chronic combined systolic and diastolic CHF (congestive heart failure) (HCC) Active Problems:   Dilated cardiomyopathy (HCC)   Depression   Chronic renal failure, stage 3b       1. Acute on chronic combined systolic and diastolic CHF  Patient presents for evaluation of acute onset shortness of breath and chest pain associated with orthopnea and bilateral lower extremity swelling. Last known LVEF is less than 20%  as well is grade 3 diastolic dysfunction CXR shows cardiomegaly with vascular congestion Place patient on IV diuresis with Lasix 40mg  IV q 12 Continue Aldactone, Entresto and carvedilol Request cardiology consult Continue low-sodium diet and fluid  restriction   2. Hypertensive urgency. This could be contributing to her acute CHF.  Increase the dose of Carvedilol to 12.5mg  BID Optimize blood pressure control    3. Elevated troponin I. Most likely secondary to demand ischemia from acute CHF Obtain serial troponin levels    4. Depression. Continue Prozac.    5. History of cardiac thrombus Continue anticoagulation with Xarelto   6.  Chronic kidney disease stage III Stable   DVT prophylaxis: Xarelto Code Status: Full Family Communication: Greater than 50% of time was spent discussing patient's condition and plan  of care with her at the bedside.  All questions and concerns have been addressed Disposition Plan: Back to previous home environment Consults called: Cardiology    Kengo Sturges MD [facility]     06/03/2019, 8:08 AM

## 2019-06-04 LAB — BASIC METABOLIC PANEL
Anion gap: 10 (ref 5–15)
BUN: 36 mg/dL — ABNORMAL HIGH (ref 8–23)
CO2: 24 mmol/L (ref 22–32)
Calcium: 8.6 mg/dL — ABNORMAL LOW (ref 8.9–10.3)
Chloride: 107 mmol/L (ref 98–111)
Creatinine, Ser: 1.9 mg/dL — ABNORMAL HIGH (ref 0.44–1.00)
GFR calc Af Amer: 32 mL/min — ABNORMAL LOW (ref >60)
GFR calc non Af Amer: 28 mL/min — ABNORMAL LOW (ref >60)
Glucose, Bld: 158 mg/dL — ABNORMAL HIGH (ref 70–99)
Potassium: 3.9 mmol/L (ref 3.5–5.1)
Sodium: 141 mmol/L (ref 135–145)

## 2019-06-04 LAB — URINE DRUG SCREEN, QUALITATIVE (ARMC ONLY)
Amphetamines, Ur Screen: NOT DETECTED
Barbiturates, Ur Screen: NOT DETECTED
Benzodiazepine, Ur Scrn: NOT DETECTED
Cannabinoid 50 Ng, Ur ~~LOC~~: NOT DETECTED
Cocaine Metabolite,Ur ~~LOC~~: POSITIVE — AB
MDMA (Ecstasy)Ur Screen: NOT DETECTED
Methadone Scn, Ur: NOT DETECTED
Opiate, Ur Screen: NOT DETECTED
Phencyclidine (PCP) Ur S: NOT DETECTED
Tricyclic, Ur Screen: NOT DETECTED

## 2019-06-04 MED ORDER — FUROSEMIDE 10 MG/ML IJ SOLN
60.0000 mg | Freq: Two times a day (BID) | INTRAMUSCULAR | Status: DC
  Administered 2019-06-04 – 2019-06-06 (×4): 60 mg via INTRAVENOUS
  Filled 2019-06-04 (×5): qty 6

## 2019-06-04 MED ORDER — FUROSEMIDE 10 MG/ML IJ SOLN
40.0000 mg | Freq: Two times a day (BID) | INTRAMUSCULAR | Status: DC
  Administered 2019-06-04: 40 mg via INTRAVENOUS
  Filled 2019-06-04: qty 4

## 2019-06-04 MED ORDER — CARVEDILOL 6.25 MG PO TABS
6.2500 mg | ORAL_TABLET | Freq: Two times a day (BID) | ORAL | Status: DC
  Administered 2019-06-04 – 2019-06-06 (×4): 6.25 mg via ORAL
  Filled 2019-06-04 (×4): qty 1

## 2019-06-04 MED ORDER — IPRATROPIUM-ALBUTEROL 0.5-2.5 (3) MG/3ML IN SOLN
3.0000 mL | Freq: Two times a day (BID) | RESPIRATORY_TRACT | Status: DC
  Administered 2019-06-04 – 2019-06-05 (×2): 3 mL via RESPIRATORY_TRACT
  Filled 2019-06-04 (×2): qty 3

## 2019-06-04 MED ORDER — ALUM & MAG HYDROXIDE-SIMETH 200-200-20 MG/5ML PO SUSP
30.0000 mL | ORAL | Status: DC | PRN
  Filled 2019-06-04: qty 30

## 2019-06-04 MED ORDER — LACTULOSE 10 GM/15ML PO SOLN
20.0000 g | Freq: Two times a day (BID) | ORAL | Status: DC | PRN
  Administered 2019-06-04 – 2019-06-05 (×2): 20 g via ORAL
  Filled 2019-06-04 (×2): qty 30

## 2019-06-04 MED ORDER — DOXYLAMINE SUCCINATE (SLEEP) 25 MG PO TABS
25.0000 mg | ORAL_TABLET | Freq: Every evening | ORAL | Status: DC | PRN
  Administered 2019-06-04: 25 mg via ORAL
  Filled 2019-06-04 (×3): qty 1

## 2019-06-04 NOTE — Progress Notes (Signed)
Patient ID: Katelyn Lane, female   DOB: 1955-04-01, 64 y.o.   MRN: [id number] [facility] PROGRESS NOTE  Katelyn Lane MCN:[id number] DOB: 1955/12/09 DOA: 06/03/2019 PCP: [name], MD  HPI/Subjective: Patient was initially upset with me that I sent her home and now that she is back here in the hospital.  Patient deferred me asking the nurse to come in.  Complains that she is full of fluid.  States her breathing is not good.  At the end of our conversation she wished me a blessed day.  Objective: Vitals:   06/04/19 0728 06/04/19 1201  BP: 91/66 101/70  Pulse: (!) 56 73  Resp: 20 19  Temp:    SpO2: 100% 95%    Intake/Output Summary (Last 24 hours) at 06/04/2019 1542 Last data filed at 06/04/2019 1400 Gross per 24 hour  Intake 1080 ml  Output 400 ml  Net 680 ml   Filed Weights   06/03/19 0815 06/03/19 1725 06/04/19 0347  Weight: 102.1 kg 102.2 kg 101.2 kg    ROS: Review of Systems  Constitutional: Negative for fever.  Eyes: Negative for blurred vision.  Respiratory: Positive for cough and shortness of breath.   Cardiovascular: Negative for chest pain.  Gastrointestinal: Positive for abdominal pain. Negative for constipation, diarrhea, nausea and vomiting.  Genitourinary: Negative for dysuria.  Musculoskeletal: Negative for joint pain.  Neurological: Negative for dizziness.   Exam: Physical Exam  Constitutional: She is oriented to person, place, and time.  HENT:  Nose: No mucosal edema.  Mouth/Throat: No oropharyngeal exudate or posterior oropharyngeal edema.  Eyes: Conjunctivae and lids are normal.  Neck: Carotid bruit is not present.  Cardiovascular: S1 normal and S2 normal. Exam reveals no gallop.  No murmur heard. Respiratory: No respiratory distress. She has decreased breath sounds in the right lower field and the left lower field. She has no wheezes. She has no rhonchi. She has no rales.  GI: Soft. Bowel sounds are normal. There is no abdominal  tenderness.  Musculoskeletal:     Right ankle: Swelling present.     Left ankle: Swelling present.  Lymphadenopathy:    She has no cervical adenopathy.  Neurological: She is alert and oriented to person, place, and time. No cranial nerve deficit.  Skin: Skin is warm. No rash noted. Nails show no clubbing.  Psychiatric: She has a normal mood and affect.      Data Reviewed: Basic Metabolic Panel: Recent Labs  Lab 06/03/19 0403 06/03/19 1852 06/04/19 0508  NA 142  --  141  K 3.5  --  3.9  CL 106  --  107  CO2 25  --  24  GLUCOSE 151*  --  158*  BUN 34*  --  36*  CREATININE 1.94*  --  1.90*  CALCIUM 8.5*  --  8.6*  MG  --  2.0  --   PHOS  --  2.1*  --    Liver Function Tests: Recent Labs  Lab 06/03/19 0403  AST 40  ALT 26  ALKPHOS 141*  BILITOT 1.2  PROT 5.8*  ALBUMIN 2.9*   CBC: Recent Labs  Lab 06/03/19 0403  WBC 5.7  HGB 10.4*  HCT 35.5*  MCV 77.7*  PLT 239   BNP (last 3 results) Recent Labs    05/21/19 1437 06/03/19 0403 06/03/19 1852  BNP >4,500.0* >4,500.0* >4,500.0*     Studies: DG Chest Port 1 View  Result Date: 06/03/2019 CLINICAL DATA:  Dyspnea EXAM: PORTABLE CHEST 1 VIEW  COMPARISON:  Radiograph 05/21/2019 FINDINGS: Low lung volumes with gradient opacity towards the bases likely accentuated by atelectatic change and patient body habitus. There is some mild central vascular congestion with hazy interstitial opacities and fissural thickening. No pneumothorax or visible effusion. Cardiomegaly is similar to prior counting for differences in inflation in technique. No acute osseous or soft tissue abnormality. Telemetry leads overlie the chest. IMPRESSION: Cardiomegaly with central vascular congestion. Increasing interstitial opacity could reflect developing interstitial edema. Additional atelectatic changes in the lung bases. Electronically Signed   By: Lovena Le M.D.   On: 06/03/2019 04:50    Scheduled Meds: . ascorbic acid  500 mg Oral Daily   . aspirin  81 mg Oral Daily  . atorvastatin  20 mg Oral q1800  . carvedilol  12.5 mg Oral BID WC  . docusate sodium  100 mg Oral Daily  . FLUoxetine  20 mg Oral Daily  . furosemide  40 mg Intravenous BID  . ipratropium-albuterol  3 mL Nebulization BID  . pantoprazole  40 mg Oral Daily  . rivaroxaban  20 mg Oral Q supper  . sacubitril-valsartan  1 tablet Oral BID  . sodium chloride flush  3 mL Intravenous Q12H   Continuous Infusions: . sodium chloride      Assessment/Plan:  1. Acute on chronic systolic congestive heart failure.  Last EF is less than 20%.  Continue IV Lasix but will increase the dose to 60 mg twice daily, Entresto and Coreg.  Ordered TED hose.  Daily weights. 2. Hypertensive urgency on presentation.  Now blood pressure on the lower side.  Cut back Coreg dose to her normal dose. 3. Chronic kidney disease stage IIIb.  Continue to watch with diuresis 4. 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 Nonsustained ventricular tachycardia.  Cardiology following. 6. Hyperlipidemia unspecified on atorvastatin 7. Anxiety depression on fluoxetine 8. COPD.  Respiratory status stable 9. Urine toxicology still positive for cocaine    Code Status:     Code Status Orders  (From admission, onward)         Start     Ordered   06/03/19 0816  Full code  Continuous     06/03/19 0824        Code Status History    Date Active Date Inactive Code Status Order ID Comments User Context   05/21/2019 2056 05/26/2019 1829 Full Code [id number]  Athena Masse, MD ED   04/29/2019 0952 05/03/2019 1837 Full Code [id number]  Ivor Costa, MD ED   04/08/2019 0950 04/09/2019 1806 Full Code [id number]  Collier Bullock, MD ED   03/24/2019 2343 03/28/2019 1724 Full Code [id number]  Mansy, Arvella Merles, MD ED   03/06/2019 1425 03/10/2019 1658 Full Code [id number]  Ivor Costa, MD Inpatient   02/13/2019 1508 02/16/2019 1846 Full Code [id number]  Max Sane, MD ED   01/31/2019 0815 02/04/2019 1748 Full Code [id number]   Para Skeans, MD ED   01/31/2019 0814 01/31/2019 0815 Full Code [id number]  Para Skeans, MD ED   01/18/2019 0038 01/21/2019 1923 DNR [id number]  Lenore Cordia, MD ED   12/29/2018 0449 01/01/2019 2033 Full Code [id number]  Mansy, Arvella Merles, MD ED   Advance Care Planning Activity     Family Communication: Refused Disposition Plan: Status is: Inpatient  Dispo: The patient is from: Home              Anticipated d/c is to: Home  Anticipated d/c date is: Will depend on how the patient feels.  May be a few days of IV Lasix.              Patient currently receiving IV Lasix.  Patient high risk for readmission.   Consultants:  Cardiology  Time spent: 28 minutes  [place]

## 2019-06-04 NOTE — Progress Notes (Signed)
[facility]    SUBJECTIVE: Patient sitting up eating breakfast feels reasonably well still has significant bilateral lower extremity edema still has some dyspnea with activity denies much in the way of chest pain no significant palpitation tachycardia blackout spells or syncope   Vitals:   06/03/19 1855 06/03/19 1927 06/04/19 0347 06/04/19 0728  BP:  109/78 (!) 117/93 91/66  Pulse: 67 76 70 (!) 56  Resp:    20  Temp:  (!) 97.5 F (36.4 C) (!) 97.5 F (36.4 C)   TempSrc:  Oral    SpO2:  100% 99% 100%  Weight:   101.2 kg   Height:         Intake/Output Summary (Last 24 hours) at 06/04/2019 1154 Last data filed at 06/04/2019 1000 Gross per 24 hour  Intake 840 ml  Output 400 ml  Net 440 ml      PHYSICAL EXAM  General: Well developed, well nourished, in no acute distress HEENT:  Normocephalic and atramatic Neck:  No JVD.  Lungs: Clear bilaterally to auscultation and percussion. Heart: HRRR . Normal S1 and S2 without gallops or murmurs.  Abdomen: Bowel sounds are positive, abdomen soft and non-tender  Msk:  Back normal, normal gait. Normal strength and tone for age. Extremities: No clubbing, cyanosis or edema.   Neuro: Alert and oriented X 3. Psych:  Good affect, responds appropriately   LABS: Basic Metabolic Panel: Recent Labs    06/03/19 0403 06/03/19 1852 06/04/19 0508  NA 142  --  141  K 3.5  --  3.9  CL 106  --  107  CO2 25  --  24  GLUCOSE 151*  --  158*  BUN 34*  --  36*  CREATININE 1.94*  --  1.90*  CALCIUM 8.5*  --  8.6*  MG  --  2.0  --   PHOS  --  2.1*  --    Liver Function Tests: Recent Labs    06/03/19 0403  AST 40  ALT 26  ALKPHOS 141*  BILITOT 1.2  PROT 5.8*  ALBUMIN 2.9*   No results for input(s): LIPASE, AMYLASE in the last 72 hours. CBC: Recent Labs    06/03/19 0403  WBC 5.7  HGB 10.4*  HCT 35.5*  MCV 77.7*  PLT 239   Cardiac Enzymes: No results for input(s): CKTOTAL, CKMB, CKMBINDEX, TROPONINI in the last 72  hours. BNP: Invalid input(s): POCBNP D-Dimer: No results for input(s): DDIMER in the last 72 hours. Hemoglobin A1C: No results for input(s): HGBA1C in the last 72 hours. Fasting Lipid Panel: No results for input(s): CHOL, HDL, LDLCALC, TRIG, CHOLHDL, LDLDIRECT in the last 72 hours. Thyroid Function Tests: No results for input(s): TSH, T4TOTAL, T3FREE, THYROIDAB in the last 72 hours.  Invalid input(s): FREET3 Anemia Panel: No results for input(s): VITAMINB12, FOLATE, FERRITIN, TIBC, IRON, RETICCTPCT in the last 72 hours.  DG Chest Port 1 View  Result Date: 06/03/2019 CLINICAL DATA:  Dyspnea EXAM: PORTABLE CHEST 1 VIEW COMPARISON:  Radiograph 05/21/2019 FINDINGS: Low lung volumes with gradient opacity towards the bases likely accentuated by atelectatic change and patient body habitus. There is some mild central vascular congestion with hazy interstitial opacities and fissural thickening. No pneumothorax or visible effusion. Cardiomegaly is similar to prior counting for differences in inflation in technique. No acute osseous or soft tissue abnormality. Telemetry leads overlie the chest. IMPRESSION: Cardiomegaly with central vascular congestion. Increasing interstitial opacity could reflect developing interstitial edema. Additional atelectatic changes in the lung bases.  Electronically Signed   By: Lovena Le M.D.   On: 06/03/2019 04:50     Echo severely depressed left ventricular function globally ejection fraction around 25%  TELEMETRY: Sinus rhythm bundle branch block rate of 70  ASSESSMENT AND PLAN:  Principal Problem:   Acute on chronic combined systolic and diastolic CHF (congestive heart failure) (HCC) Active Problems:   Dilated cardiomyopathy ([place])   Depression   Chronic renal failure, stage 3b   Acute on chronic combined systolic (congestive) and diastolic (congestive) heart failure (HCC)    Plan Continue telemetry EKGs supplemental oxygen Recommend diuretics for edema  support stockings elevation Continue Entresto for heart failure cardiomyopathy Inhalers diuretics Entresto for shortness of breath heart failure symptoms Follow-up with nephrology for renal insufficiency Have the patient follow-up with heart failure clinic to help with compliance   Yolonda Kida, MD 06/04/2019 11:54 AM

## 2019-06-04 NOTE — IPOC Note (Signed)
Patient found off Telemetry and taking a shower. Patient instructed to come out of the shower, Patient declined, Patient informed cannot monitor heart rhythm for arrhthymias , like her 11 beat run of VT.Itwas explained to patient that should a rhythm as like VT occur can make her lightheaded, dizzy, and pass out. Pt continues with shower.

## 2019-06-05 DIAGNOSIS — E785 Hyperlipidemia, unspecified: Secondary | ICD-10-CM

## 2019-06-05 DIAGNOSIS — I160 Hypertensive urgency: Secondary | ICD-10-CM

## 2019-06-05 DIAGNOSIS — J449 Chronic obstructive pulmonary disease, unspecified: Secondary | ICD-10-CM

## 2019-06-05 DIAGNOSIS — I472 Ventricular tachycardia: Secondary | ICD-10-CM

## 2019-06-05 LAB — BASIC METABOLIC PANEL
Anion gap: 9 (ref 5–15)
BUN: 39 mg/dL — ABNORMAL HIGH (ref 8–23)
CO2: 27 mmol/L (ref 22–32)
Calcium: 9 mg/dL (ref 8.9–10.3)
Chloride: 107 mmol/L (ref 98–111)
Creatinine, Ser: 1.95 mg/dL — ABNORMAL HIGH (ref 0.44–1.00)
GFR calc Af Amer: 31 mL/min — ABNORMAL LOW (ref >60)
GFR calc non Af Amer: 27 mL/min — ABNORMAL LOW (ref >60)
Glucose, Bld: 115 mg/dL — ABNORMAL HIGH (ref 70–99)
Potassium: 3.5 mmol/L (ref 3.5–5.1)
Sodium: 143 mmol/L (ref 135–145)

## 2019-06-05 MED ORDER — POTASSIUM CHLORIDE CRYS ER 20 MEQ PO TBCR
40.0000 meq | EXTENDED_RELEASE_TABLET | Freq: Every day | ORAL | Status: DC
  Administered 2019-06-05 – 2019-06-06 (×2): 40 meq via ORAL
  Filled 2019-06-05 (×2): qty 2

## 2019-06-05 MED ORDER — IPRATROPIUM-ALBUTEROL 0.5-2.5 (3) MG/3ML IN SOLN: 3.0000 mL | Freq: Four times a day (QID) | RESPIRATORY_TRACT | Status: DC | PRN

## 2019-06-05 MED ORDER — LORAZEPAM 2 MG/ML IJ SOLN
1.0000 mg | Freq: Once | INTRAMUSCULAR | Status: AC
  Administered 2019-06-05: 1 mg via INTRAVENOUS
  Filled 2019-06-05: qty 1

## 2019-06-05 MED ORDER — QUETIAPINE FUMARATE 25 MG PO TABS
25.0000 mg | ORAL_TABLET | Freq: Every day | ORAL | Status: DC
  Administered 2019-06-05: 25 mg via ORAL
  Filled 2019-06-05: qty 1

## 2019-06-05 MED ORDER — MAGNESIUM SULFATE 2 GM/50ML IV SOLN
2.0000 g | Freq: Once | INTRAVENOUS | Status: AC
  Administered 2019-06-05: 2 g via INTRAVENOUS
  Filled 2019-06-05: qty 50

## 2019-06-05 NOTE — Progress Notes (Signed)
Patient ID: Katelyn Lane, female   DOB: [birth year], 64 y.o.   MRN: [id number] [facility] PROGRESS NOTE  Mariska Daffin OEU:[id number] DOB: 1955-05-29 DOA: 06/03/2019 PCP: [name], MD  HPI/Subjective: Patient lethargic this morning.  Did not sleep well.  She states she is not feeling well.  Still has swelling.  Some shortness of breath.  She answered a phone call while I was in the room and proceeded to talk on the phone.  She was falling asleep when trying to talk on the phone.  Nursing staff in the room with me while I was seeing the patient.  Objective: Vitals:   06/05/19 1052 06/05/19 1518  BP: 117/83 (!) 121/104  Pulse: 65 66  Resp: (!) 22 18  Temp: 98.3 F (36.8 C) 98.2 F (36.8 C)  SpO2: 100% 100%    Intake/Output Summary (Last 24 hours) at 06/05/2019 1628 Last data filed at 06/05/2019 1345 Gross per 24 hour  Intake 720 ml  Output 1000 ml  Net -280 ml   Filed Weights   06/03/19 1725 06/04/19 0347 06/05/19 0414  Weight: 102.2 kg 101.2 kg 101.7 kg    ROS: Review of Systems  Unable to perform ROS: Acuity of condition  Respiratory: Positive for shortness of breath.   Cardiovascular: Negative for chest pain.  Gastrointestinal: Negative for abdominal pain.   Exam: Physical Exam  Constitutional: She appears lethargic.  HENT:  Nose: No mucosal edema.  Mouth/Throat: No oropharyngeal exudate or posterior oropharyngeal edema.  Eyes: Conjunctivae and lids are normal.  Neck: Carotid bruit is not present.  Cardiovascular: S1 normal and S2 normal. Exam reveals no gallop.  No murmur heard. Respiratory: No respiratory distress. She has decreased breath sounds in the right lower field and the left lower field. She has no wheezes. She has no rhonchi. She has no rales.  GI: Soft. Bowel sounds are normal. There is no abdominal tenderness.  Musculoskeletal:     Right ankle: Swelling present.     Left ankle: Swelling present.  Lymphadenopathy:    She has no  cervical adenopathy.  Neurological: She appears lethargic.  Skin: Skin is warm. No rash noted. Nails show no clubbing.  Psychiatric:  Lethargic this morning      Data Reviewed: Basic Metabolic Panel: Recent Labs  Lab 06/03/19 0403 06/03/19 1852 06/04/19 0508 06/05/19 0456  NA 142  --  141 143  K 3.5  --  3.9 3.5  CL 106  --  107 107  CO2 25  --  24 27  GLUCOSE 151*  --  158* 115*  BUN 34*  --  36* 39*  CREATININE 1.94*  --  1.90* 1.95*  CALCIUM 8.5*  --  8.6* 9.0  MG  --  2.0  --   --   PHOS  --  2.1*  --   --    Liver Function Tests: Recent Labs  Lab 06/03/19 0403  AST 40  ALT 26  ALKPHOS 141*  BILITOT 1.2  PROT 5.8*  ALBUMIN 2.9*   CBC: Recent Labs  Lab 06/03/19 0403  WBC 5.7  HGB 10.4*  HCT 35.5*  MCV 77.7*  PLT 239   BNP (last 3 results) Recent Labs    05/21/19 1437 06/03/19 0403 06/03/19 1852  BNP >4,500.0* >4,500.0* >4,500.0*    Scheduled Meds: . ascorbic acid  500 mg Oral Daily  . aspirin  81 mg Oral Daily  . atorvastatin  20 mg Oral q1800  . carvedilol  6.25 mg  Oral BID WC  . docusate sodium  100 mg Oral Daily  . FLUoxetine  20 mg Oral Daily  . furosemide  60 mg Intravenous BID  . ipratropium-albuterol  3 mL Nebulization BID  . pantoprazole  40 mg Oral Daily  . potassium chloride  40 mEq Oral Daily  . rivaroxaban  20 mg Oral Q supper  . sacubitril-valsartan  1 tablet Oral BID  . sodium chloride flush  3 mL Intravenous Q12H   Continuous Infusions: . sodium chloride 50 mL (06/05/19 0846)    Assessment/Plan:  1. Acute on chronic systolic congestive heart failure.  Last EF is less than 20%.  Continue IV Lasix 60 mg twice daily.  Entresto and Coreg.  Ordered TED hose.  Daily weights.  Since the patient is on Entresto the BMP will always be high. 2. Hypertensive urgency on presentation.  Blood pressure variable.  Last blood pressure high.  Previous blood pressure normal.  On Entresto and Coreg. 3. Chronic kidney disease stage IIIb.   Continue to watch with diuresis 4. 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 Nonsustained ventricular tachycardia.  Cardiology following. 6. Hyperlipidemia unspecified on atorvastatin 7. Anxiety depression on fluoxetine 8. COPD.  Respiratory status stable 9. Urine toxicology still positive for cocaine    Code Status:     Code Status Orders  (From admission, onward)         Start     Ordered   06/03/19 0816  Full code  Continuous     06/03/19 0824        Code Status History    Date Active Date Inactive Code Status Order ID Comments User Context   05/21/2019 2056 05/26/2019 1829 Full Code [id number]  Athena Masse, MD ED   04/29/2019 0952 05/03/2019 1837 Full Code [id number]  Ivor Costa, MD ED   04/08/2019 0950 04/09/2019 1806 Full Code [id number]  Collier Bullock, MD ED   03/24/2019 2343 03/28/2019 1724 Full Code [id number]  Mansy, Arvella Merles, MD ED   03/06/2019 1425 03/10/2019 1658 Full Code [id number]  Ivor Costa, MD Inpatient   02/13/2019 1508 02/16/2019 1846 Full Code [id number]  Max Sane, MD ED   01/31/2019 0815 02/04/2019 1748 Full Code [id number]  Para Skeans, MD ED   01/31/2019 0814 01/31/2019 0815 Full Code [id number]  Para Skeans, MD ED   01/18/2019 0038 01/21/2019 1923 DNR [id number]  Lenore Cordia, MD ED   12/29/2018 0449 01/01/2019 2033 Full Code [id number]  Mansy, Arvella Merles, MD ED   Advance Care Planning Activity     Family Communication: Refused Disposition Plan: Status is: Inpatient  Dispo: The patient is from: Home              Anticipated d/c is to: Home              Anticipated d/c date is: Dependent on the patient.              Patient currently receiving IV Lasix.  Patient high risk for readmission.  Transitional care team knows this patient and she usually ends up here in the hospital at the end of the month.  Consultants:  Cardiology  Time spent: 27 minutes.  Case discussed with transitional care team and nursing staff  [facility]  [facility]

## 2019-06-05 NOTE — Plan of Care (Signed)
Pt had decreased LOC at shift change that resolved with application of oxygen 4L/min [place].  Pt alert and oriented x4 after sleeping for 2 hours.     Problem: Clinical Measurements: Goal: Ability to maintain clinical measurements within normal limits will improve Outcome: Progressing Goal: Will remain free from infection Outcome: Progressing Goal: Diagnostic test results will improve Outcome: Progressing Goal: Respiratory complications will improve Outcome: Progressing Goal: Cardiovascular complication will be avoided Outcome: Progressing   Problem: Cardiac: Goal: Ability to achieve and maintain adequate cardiopulmonary perfusion will improve Outcome: Progressing

## 2019-06-05 NOTE — Progress Notes (Signed)
   06/05/19 0800  Assess: MEWS Score  Level of Consciousness Responds to Voice (lethargic and confused)  Assess: MEWS Score  MEWS Temp 0  MEWS Systolic 0  MEWS Pulse 0  MEWS RR 1  MEWS LOC 1  MEWS Score 2  MEWS Score Color Yellow  Assess: if the MEWS score is Yellow or Red  Were vital signs taken at a resting state? Yes  Focused Assessment Documented focused assessment  Early Detection of Sepsis Score *See Row Information* Low  MEWS guidelines implemented *See Row Information* Yes  Treat  MEWS Interventions Administered scheduled meds/treatments;Administered prn meds/treatments;Escalated (See documentation below)  Take Vital Signs  Increase Vital Sign Frequency  Yellow: Q 2hr X 2 then Q 4hr X 2, if remains yellow, continue Q 4hrs  Escalate  MEWS: Escalate Yellow: discuss with charge nurse/RN and consider discussing with provider and RRT  Notify: Charge Nurse/RN  Name of Charge Nurse/RN Notified Darlene Listopad  Date Charge Nurse/RN Notified 06/05/19  Time Charge Nurse/RN Notified 4503  Notify: Provider  Provider Name/Title Wieting MD  Date Provider Notified 06/05/19  Time Provider Notified 0800  Notification Type Face-to-face  Notification Reason Change in status  Response Other (Comment) (Oxygen applied)  Date of Provider Response 06/05/19  Time of Provider Response 0800  Document  Patient Outcome Stabilized after interventions  Progress note created (see row info) Yes

## 2019-06-06 LAB — BASIC METABOLIC PANEL
Anion gap: 11 (ref 5–15)
BUN: 39 mg/dL — ABNORMAL HIGH (ref 8–23)
CO2: 27 mmol/L (ref 22–32)
Calcium: 8.7 mg/dL — ABNORMAL LOW (ref 8.9–10.3)
Chloride: 105 mmol/L (ref 98–111)
Creatinine, Ser: 2.04 mg/dL — ABNORMAL HIGH (ref 0.44–1.00)
GFR calc Af Amer: 29 mL/min — ABNORMAL LOW (ref >60)
GFR calc non Af Amer: 25 mL/min — ABNORMAL LOW (ref >60)
Glucose, Bld: 110 mg/dL — ABNORMAL HIGH (ref 70–99)
Potassium: 3.4 mmol/L — ABNORMAL LOW (ref 3.5–5.1)
Sodium: 143 mmol/L (ref 135–145)

## 2019-06-06 LAB — CBC
HCT: 33.6 % — ABNORMAL LOW (ref 36.0–46.0)
Hemoglobin: 9.9 g/dL — ABNORMAL LOW (ref 12.0–15.0)
MCH: 22.7 pg — ABNORMAL LOW (ref 26.0–34.0)
MCHC: 29.5 g/dL — ABNORMAL LOW (ref 30.0–36.0)
MCV: 76.9 fL — ABNORMAL LOW (ref 80.0–100.0)
Platelets: 214 10*3/uL (ref 150–400)
RBC: 4.37 MIL/uL (ref 3.87–5.11)
RDW: 22 % — ABNORMAL HIGH (ref 11.5–15.5)
WBC: 5.2 10*3/uL (ref 4.0–10.5)
nRBC: 0 % (ref 0.0–0.2)

## 2019-06-06 LAB — URIC ACID: Uric Acid, Serum: 14.1 mg/dL — ABNORMAL HIGH (ref 2.5–7.1)

## 2019-06-06 MED ORDER — POLYETHYLENE GLYCOL 3350 17 G PO PACK: 17.0000 g | PACK | Freq: Every day | ORAL | Status: DC

## 2019-06-06 MED ORDER — HYDRALAZINE HCL 25 MG PO TABS
25.0000 mg | ORAL_TABLET | Freq: Three times a day (TID) | ORAL | Status: DC
  Administered 2019-06-06: 25 mg via ORAL
  Filled 2019-06-06: qty 1

## 2019-06-06 MED ORDER — COLCHICINE 0.6 MG PO TABS
0.6000 mg | ORAL_TABLET | ORAL | 0 refills | Status: DC
Start: 2019-06-06 — End: 2019-06-19

## 2019-06-06 MED ORDER — PREDNISONE 10 MG PO TABS: 10.0000 mg | ORAL_TABLET | Freq: Every day | ORAL | Status: DC

## 2019-06-06 MED ORDER — PREDNISONE 10 MG PO TABS: ORAL_TABLET | ORAL | 0 refills | Status: DC

## 2019-06-06 MED ORDER — ALLOPURINOL 100 MG PO TABS
100.0000 mg | ORAL_TABLET | Freq: Every day | ORAL | 0 refills | Status: DC
Start: 2019-06-06 — End: 2020-02-24

## 2019-06-06 NOTE — Discharge Instructions (Signed)

## 2019-06-06 NOTE — Discharge Summary (Signed)
[place] [facility] [place] NAME: Katelyn Lane    MR#:  [id number]  DATE OF BIRTH:  April 14, 1955  DATE OF ADMISSION:  06/03/2019 ADMITTING PHYSICIAN: Collier Bullock, MD  DATE OF DISCHARGE: 06/06/2019  1:51 PM  PRIMARY CARE PHYSICIAN: Revelo, Elyse Jarvis, MD    ADMISSION DIAGNOSIS:  CHF, acute on chronic (HCC) [I50.9] Acute on chronic congestive heart failure, unspecified heart failure type ([place]) [I50.9] Acute on chronic combined systolic (congestive) and diastolic (congestive) heart failure ([place]) [I50.43]  DISCHARGE DIAGNOSIS:  Principal Problem:   Acute on chronic combined systolic and diastolic CHF (congestive heart failure) (HCC) Active Problems:   Dilated cardiomyopathy (HCC)   Depression   Stage 3b chronic kidney disease   Chronic obstructive pulmonary disease (HCC)   Ventricular tachycardia, non-sustained (HCC)   Acute on chronic combined systolic (congestive) and diastolic (congestive) heart failure ([place])   SECONDARY DIAGNOSIS:   Past Medical History:  Diagnosis Date  . CHF (congestive heart failure) ([place])   . COVID-19   . Hyperlipidemia   . Hypertension   . Renal disorder     HOSPITAL COURSE:  Patient this morning stated that she was not feeling good and had some swelling and some pain in her ankles.  I gave her low-dose steroid to treat suspected gout.  Uric acid was high. I thought I was going to keep the patient an extra day and reevaluate tomorrow.  This afternoon she told the nurse that she needs to get out of the hospital because she is being evicted.  1.  Acute on chronic systolic congestive heart failure with last ejection fraction less than 20%.  Patient was on IV Lasix 60 mg twice a day, Entresto and Coreg.  TED hose were ordered.  Daily weights were done.  Since the patient is on Entresto her BNP will always be high. 2.  Hypertensive urgency on presentation.  Blood pressure variable during the hospital course.   Last blood pressure normal.  Continue Entresto and Coreg. 3.  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  Nonsustained ventricular tachycardia.  Replace electrolytes during hospital course.  Cardiology wanted to do conservative management. 5.  Hyperlipidemia unspecified on atorvastatin 6.  Anxiety depression on fluoxetine 7.  COPD.  Respiratory status stable 8.  Urine toxicology again positive for cocaine. 9.  Acute gout bilateral ankles.  Patient able to flex and extend at the ankles does have some pain to minimal palpation.  Started low-dose prednisone.  Since uric acid is high I did prescribe every other day colchicine for 4 doses to go home with and low-dose allopurinol. 10.  Chronic kidney disease stage IIIb.  Continue to monitor as outpatient  DISCHARGE CONDITIONS:   Patient is high risk for readmission Patient stated that she needs to go home today because she is being evicted  CONSULTS OBTAINED:  Treatment Team:  Yolonda Kida, MD  DRUG ALLERGIES:   Allergies  Allergen Reactions  . Penicillins Hives, Rash and Other (See Comments)    Did it involve swelling of the face/tongue/throat, SOB, or low BP? No Did it involve sudden or severe rash/hives, skin peeling, or any reaction on the inside of your mouth or nose? Yes Did you need to seek medical attention at a hospital or doctor's office? Yes When did it last happen? >25 years If all above answers are "NO", may proceed with cephalosporin use.   . Ace Inhibitors Nausea And Vomiting    DISCHARGE MEDICATIONS:  Allergies as of 06/06/2019      Reactions   Penicillins Hives, Rash, Other (See Comments)   Did it involve swelling of the face/tongue/throat, SOB, or low BP? No Did it involve sudden or severe rash/hives, skin peeling, or any reaction on the inside of your mouth or nose? Yes Did you need to seek medical attention at a hospital or doctor's office? Yes When did it last happen? >25 years If all  above answers are "NO", may proceed with cephalosporin use.   Ace Inhibitors Nausea And Vomiting      Medication List    TAKE these medications   albuterol 108 (90 Base) MCG/ACT inhaler Commonly known as: VENTOLIN HFA Inhale 2 puffs into the lungs every 6 (six) hours as needed for wheezing or shortness of breath.   allopurinol 100 MG tablet Commonly known as: Zyloprim Take 1 tablet (100 mg total) by mouth daily.   ascorbic acid 500 MG tablet Commonly known as: VITAMIN C Take 500 mg by mouth daily.   aspirin 81 MG chewable tablet Chew 1 tablet (81 mg total) by mouth daily.   atorvastatin 20 MG tablet Commonly known as: LIPITOR Take 1 tablet (20 mg total) by mouth daily at 6 PM.   carvedilol 3.125 MG tablet Commonly known as: COREG Take 1 tablet (3.125 mg total) by mouth 2 (two) times daily with a meal.   colchicine 0.6 MG tablet Take 1 tablet (0.6 mg total) by mouth every other day for 4 doses.   docusate calcium 240 MG capsule Commonly known as: SURFAK Take 240 mg by mouth daily as needed for moderate constipation.   Entresto 49-51 MG Generic drug: sacubitril-valsartan Take 1 tablet by mouth 2 (two) times daily.   FLUoxetine 20 MG capsule Commonly known as: PROZAC Take 1 capsule (20 mg total) by mouth daily.   furosemide 40 MG tablet Commonly known as: Lasix Take 2 tablets (80 mg total) by mouth daily.   Ipratropium-Albuterol 20-100 MCG/ACT Aers respimat Commonly known as: COMBIVENT Inhale 1 puff into the lungs every 6 (six) hours.   loratadine 10 MG tablet Commonly known as: CLARITIN Take 10 mg by mouth daily as needed for allergies.   nitroGLYCERIN 0.4 MG SL tablet Commonly known as: NITROSTAT Place 1 tablet (0.4 mg total) under the tongue every 5 (five) minutes as needed for chest pain.   pantoprazole 40 MG tablet Commonly known as: Protonix Take 1 tablet (40 mg total) by mouth daily.   predniSONE 10 MG tablet Commonly known as: DELTASONE One  tablet po daily for three days then 1/2 tablet daily for 4 days   rivaroxaban 20 MG Tabs tablet Commonly known as: XARELTO Take 1 tablet (20 mg total) by mouth daily with supper. For the blood clot in your heart.        DISCHARGE INSTRUCTIONS:   Follow-up PMD 5 days Follow-up CHF clinic  If you experience worsening of your admission symptoms, develop shortness of breath, life threatening emergency, suicidal or homicidal thoughts you must seek medical attention immediately by calling 911 or calling your MD immediately  if symptoms less severe.  You Must read complete instructions/literature along with all the possible adverse reactions/side effects for all the Medicines you take and that have been prescribed to you. Take any new Medicines after you have completely understood and accept all the possible adverse reactions/side effects.   Please note  You were cared for by a hospitalist during your hospital stay. If you have any questions about  your discharge medications or the care you received while you were in the hospital after you are discharged, you can call the unit and asked to speak with the hospitalist on call if the hospitalist that took care of you is not available. Once you are discharged, your primary care physician will handle any further medical issues. Please note that NO REFILLS for any discharge medications will be authorized once you are discharged, as it is imperative that you return to your primary care physician (or establish a relationship with a primary care physician if you do not have one) for your aftercare needs so that they can reassess your need for medications and monitor your lab values.    Today   CHIEF COMPLAINT:   Chief Complaint  Patient presents with  . Shortness of Breath    HISTORY OF PRESENT ILLNESS:  Katelyn Lane  is a 64 y.o. female came in with shortness of breath   VITAL SIGNS:  Blood pressure (!) 110/93, pulse (!) 56, temperature 97.8  F (36.6 C), temperature source Oral, resp. rate 17, height 5\' 8"  (1.727 m), weight 97 kg, SpO2 96 %.  I/O:    Intake/Output Summary (Last 24 hours) at 06/06/2019 1415 Last data filed at 06/06/2019 1015 Gross per 24 hour  Intake 841.24 ml  Output 200 ml  Net 641.24 ml    PHYSICAL EXAMINATION:  GENERAL:  64 y.o.-year-old patient lying in the bed with no acute distress.  EYES: Pupils equal, round, reactive to light and accommodation. No scleral icterus. Extraocular muscles intact.  HEENT: Head atraumatic, normocephalic. Oropharynx and nasopharynx clear.   LUNGS: Decreased breath sounds bilateral bases, no wheezing, rales,rhonchi or crepitation. No use of accessory muscles of respiration.  CARDIOVASCULAR: S1, S2 normal. No murmurs, rubs, or gallops.  ABDOMEN: Soft, non-tender, non-distended. Bowel sounds present. No organomegaly or mass.  EXTREMITIES: Bilateral ankle pain NEUROLOGIC: Cranial nerves II through XII are intact. Muscle strength 5/5 in all extremities. Sensation intact. Gait not checked.  PSYCHIATRIC: The patient is alert and oriented x 3.  SKIN: No obvious rash, lesion, or ulcer.   DATA REVIEW:   CBC Recent Labs  Lab 06/06/19 0651  WBC 5.2  HGB 9.9*  HCT 33.6*  PLT 214    Chemistries  Recent Labs  Lab 06/03/19 0403 06/03/19 1852 06/04/19 0508 06/06/19 0651  NA 142  --    < > 143  K 3.5  --    < > 3.4*  CL 106  --    < > 105  CO2 25  --    < > 27  GLUCOSE 151*  --    < > 110*  BUN 34*  --    < > 39*  CREATININE 1.94*  --    < > 2.04*  CALCIUM 8.5*  --    < > 8.7*  MG  --  2.0  --   --   AST 40  --   --   --   ALT 26  --   --   --   ALKPHOS 141*  --   --   --   BILITOT 1.2  --   --   --    < > = values in this interval not displayed.    Microbiology Results  Results for orders placed or performed during the hospital encounter of 05/21/19  Expectorated sputum assessment w rflx to resp cult     Status: None   Collection Time: 05/23/19 10:53 AM  Specimen: Sputum  Result Value Ref Range Status   Specimen Description SPUTUM  Final   Special Requests NONE  Final   Sputum evaluation   Final    THIS SPECIMEN IS ACCEPTABLE FOR SPUTUM CULTURE Performed at [facility], [address], [place], [place] [address]    Report Status 05/24/2019 FINAL  Final  Culture, respiratory     Status: None   Collection Time: 05/23/19 10:53 AM   Specimen: SPU  Result Value Ref Range Status   Specimen Description   Final    SPUTUM Performed at [facility], [address] [place], [place] [address]    Special Requests   Final    NONE Reflexed from [id number] Performed at [facility], [address] [place], [place] [address]    Gram Stain   Final    RARE WBC PRESENT, PREDOMINANTLY PMN RARE GRAM POSITIVE RODS    Culture   Final    Consistent with normal respiratory flora. Performed at [facility], [place] [address], [place], [place] [address]    Report Status 05/26/2019 FINAL  Final     Management plans discussed with the patient and she stated she needed to go home today.  CODE STATUS:     Code Status Orders  (From admission, onward)         Start     Ordered   06/03/19 0816  Full code  Continuous     06/03/19 0824        Code Status History    Date Active Date Inactive Code Status Order ID Comments User Context   05/21/2019 2056 05/26/2019 1829 Full Code [id number]  Athena Masse, MD ED   04/29/2019 0952 05/03/2019 1837 Full Code [id number]  Ivor Costa, MD ED   04/08/2019 0950 04/09/2019 1806 Full Code [id number]  Collier Bullock, MD ED   03/24/2019 2343 03/28/2019 1724 Full Code [id number]  Mansy, Arvella Merles, MD ED   03/06/2019 1425 03/10/2019 1658 Full Code [id number]  Ivor Costa, MD Inpatient   02/13/2019 1508 02/16/2019 1846 Full Code [id number]  Max Sane, MD ED   01/31/2019 0815 02/04/2019 1748 Full Code [id number]  Para Skeans, MD ED   01/31/2019 0814 01/31/2019 0815 Full Code [id number]  Para Skeans, MD ED    01/18/2019 0038 01/21/2019 1923 DNR [id number]  Lenore Cordia, MD ED   12/29/2018 0449 01/01/2019 2033 Full Code [id number]  Mansy, Arvella Merles, MD ED   Advance Care Planning Activity      TOTAL TIME TAKING CARE OF THIS PATIENT: 35 minutes.    Loletha Grayer M.D on 06/06/2019 at 2:15 PM  Between 7am to 6pm - Pager - [phone]  After 6pm go to [email or web address] - password [email or web address]  [facility]  CC: Primary care physician; Revelo, Elyse Jarvis, MD

## 2019-06-06 NOTE — Progress Notes (Signed)
Patient educated on discharge instructions. Patient stated "I'm not taking any new medications!". Patient verbalized that she is getting evicted from her home and needs to leave the hospital "right now". Her only complaint is foot pain. She will be staying with her brother until further notice.

## 2019-06-09 NOTE — Progress Notes (Signed)
[facility]    SUBJECTIVE: Improved shortness of breath still has significant proximal leg edema chest pain feels reasonably well   Vitals:   06/05/19 1952 06/06/19 0647 06/06/19 0701 06/06/19 1129  BP: (!) 122/95  (!) 143/114 (!) 110/93  Pulse: 78  77 (!) 56  Resp: 20  16 17   Temp: 97.9 F (36.6 C)  97.7 F (36.5 C) 97.8 F (36.6 C)  TempSrc: Oral  Oral Oral  SpO2: 100%  99% 96%  Weight:  97 kg    Height:        No intake or output data in the 24 hours ending 06/09/19 1314    PHYSICAL EXAM  General: Well developed, well nourished, in no acute distress HEENT:  Normocephalic and atramatic Neck:  No JVD.  Lungs: Clear bilaterally to auscultation and percussion. Heart: HRRR . Normal S1 and S2 without gallops or murmurs.  Abdomen: Bowel sounds are positive, abdomen soft and non-tender  Msk:  Back normal, normal gait. Normal strength and tone for age. Extremities: No clubbing, cyanosis or 2+edema.   Neuro: Alert and oriented X 3. Psych:  Good affect, responds appropriately   LABS: Basic Metabolic Panel: No results for input(s): NA, K, CL, CO2, GLUCOSE, BUN, CREATININE, CALCIUM, MG, PHOS in the last 72 hours. Liver Function Tests: No results for input(s): AST, ALT, ALKPHOS, BILITOT, PROT, ALBUMIN in the last 72 hours. No results for input(s): LIPASE, AMYLASE in the last 72 hours. CBC: No results for input(s): WBC, NEUTROABS, HGB, HCT, MCV, PLT in the last 72 hours. Cardiac Enzymes: No results for input(s): CKTOTAL, CKMB, CKMBINDEX, TROPONINI in the last 72 hours. BNP: Invalid input(s): POCBNP D-Dimer: No results for input(s): DDIMER in the last 72 hours. Hemoglobin A1C: No results for input(s): HGBA1C in the last 72 hours. Fasting Lipid Panel: No results for input(s): CHOL, HDL, LDLCALC, TRIG, CHOLHDL, LDLDIRECT in the last 72 hours. Thyroid Function Tests: No results for input(s): TSH, T4TOTAL, T3FREE, THYROIDAB in the last 72 hours.  Invalid input(s):  FREET3 Anemia Panel: No results for input(s): VITAMINB12, FOLATE, FERRITIN, TIBC, IRON, RETICCTPCT in the last 72 hours.  No results found.   Echo severely depressed left ventricular function globally EF less than 25%  TELEMETRY: Normal sinus rhythm left bundle branch block  ASSESSMENT AND PLAN:  Principal Problem:   Acute on chronic combined systolic and diastolic CHF (congestive heart failure) (HCC) Active Problems:   Dilated cardiomyopathy (HCC)   Depression   Stage 3b chronic kidney disease   Chronic obstructive pulmonary disease (HCC)   Ventricular tachycardia, non-sustained (HCC)   Acute on chronic combined systolic (congestive) and diastolic (congestive) heart failure (HCC)    Plan Maintain telemetry EKG supplemental oxygen Continue diuretic therapy for heart failure peripheral edema Maintain Entresto for cardiomyopathy Continue renal insufficiency for nephrology input treatment Continue inhalers and supplemental oxygen for COPD Have the patient follow-up as outpatient for heart failure therapy: Heart failure clinic    Yolonda Kida, MD 06/09/2019 1:14 PM

## 2019-06-11 ENCOUNTER — Telehealth: Payer: Self-pay | Admitting: Family

## 2019-06-11 NOTE — Telephone Encounter (Signed)
Unable to reach patient regarding her follow up CHF Clinic appointment on 7/7 @ 1pm that was made after her recent hospital discharge.   Alyse Low, [place]

## 2019-06-16 ENCOUNTER — Telehealth (HOSPITAL_COMMUNITY): Payer: Self-pay

## 2019-06-16 NOTE — Telephone Encounter (Signed)
Attempted to contact, unable to leave message due to she has restricted calls set up on her phone.   [place] [phone]

## 2019-06-19 ENCOUNTER — Inpatient Hospital Stay
Admission: AD | Admit: 2019-06-19 | Discharge: 2019-06-24 | DRG: 291 | Disposition: A | Discharge status: Home / Self Care | Payer: Medicaid Other | Attending: Internal Medicine | Admitting: Internal Medicine

## 2019-06-19 ENCOUNTER — Emergency Department: Payer: Medicaid Other

## 2019-06-19 ENCOUNTER — Other Ambulatory Visit: Payer: Self-pay

## 2019-06-19 DIAGNOSIS — Z9119 Patient's noncompliance with other medical treatment and regimen: Secondary | ICD-10-CM | POA: Diagnosis not present

## 2019-06-19 DIAGNOSIS — N179 Acute kidney failure, unspecified: Secondary | ICD-10-CM | POA: Diagnosis present

## 2019-06-19 DIAGNOSIS — Z7982 Long term (current) use of aspirin: Secondary | ICD-10-CM

## 2019-06-19 DIAGNOSIS — F32A Depression, unspecified: Secondary | ICD-10-CM | POA: Diagnosis present

## 2019-06-19 DIAGNOSIS — I420 Dilated cardiomyopathy: Secondary | ICD-10-CM | POA: Diagnosis present

## 2019-06-19 DIAGNOSIS — Z862 Personal history of diseases of the blood and blood-forming organs and certain disorders involving the immune mechanism: Secondary | ICD-10-CM

## 2019-06-19 DIAGNOSIS — I513 Intracardiac thrombosis, not elsewhere classified: Secondary | ICD-10-CM | POA: Diagnosis present

## 2019-06-19 DIAGNOSIS — G4733 Obstructive sleep apnea (adult) (pediatric): Secondary | ICD-10-CM | POA: Diagnosis not present

## 2019-06-19 DIAGNOSIS — I4581 Long QT syndrome: Secondary | ICD-10-CM | POA: Diagnosis not present

## 2019-06-19 DIAGNOSIS — N189 Chronic kidney disease, unspecified: Secondary | ICD-10-CM

## 2019-06-19 DIAGNOSIS — Z79899 Other long term (current) drug therapy: Secondary | ICD-10-CM | POA: Diagnosis not present

## 2019-06-19 DIAGNOSIS — D631 Anemia in chronic kidney disease: Secondary | ICD-10-CM | POA: Diagnosis present

## 2019-06-19 DIAGNOSIS — Z5320 Procedure and treatment not carried out because of patient's decision for unspecified reasons: Secondary | ICD-10-CM | POA: Diagnosis not present

## 2019-06-19 DIAGNOSIS — Z888 Allergy status to other drugs, medicaments and biological substances status: Secondary | ICD-10-CM

## 2019-06-19 DIAGNOSIS — Z20822 Contact with and (suspected) exposure to covid-19: Secondary | ICD-10-CM | POA: Diagnosis present

## 2019-06-19 DIAGNOSIS — I472 Ventricular tachycardia: Secondary | ICD-10-CM | POA: Diagnosis not present

## 2019-06-19 DIAGNOSIS — Z7901 Long term (current) use of anticoagulants: Secondary | ICD-10-CM

## 2019-06-19 DIAGNOSIS — I493 Ventricular premature depolarization: Secondary | ICD-10-CM | POA: Diagnosis present

## 2019-06-19 DIAGNOSIS — I130 Hypertensive heart and chronic kidney disease with heart failure and stage 1 through stage 4 chronic kidney disease, or unspecified chronic kidney disease: Secondary | ICD-10-CM | POA: Diagnosis present

## 2019-06-19 DIAGNOSIS — F329 Major depressive disorder, single episode, unspecified: Secondary | ICD-10-CM | POA: Diagnosis present

## 2019-06-19 DIAGNOSIS — G4730 Sleep apnea, unspecified: Secondary | ICD-10-CM | POA: Diagnosis present

## 2019-06-19 DIAGNOSIS — F1410 Cocaine abuse, uncomplicated: Secondary | ICD-10-CM | POA: Diagnosis present

## 2019-06-19 DIAGNOSIS — Z880 Allergy status to penicillin: Secondary | ICD-10-CM | POA: Diagnosis not present

## 2019-06-19 DIAGNOSIS — R778 Other specified abnormalities of plasma proteins: Secondary | ICD-10-CM | POA: Diagnosis not present

## 2019-06-19 DIAGNOSIS — I5023 Acute on chronic systolic (congestive) heart failure: Secondary | ICD-10-CM | POA: Diagnosis not present

## 2019-06-19 DIAGNOSIS — I2510 Atherosclerotic heart disease of native coronary artery without angina pectoris: Secondary | ICD-10-CM | POA: Diagnosis present

## 2019-06-19 DIAGNOSIS — J449 Chronic obstructive pulmonary disease, unspecified: Secondary | ICD-10-CM | POA: Diagnosis present

## 2019-06-19 DIAGNOSIS — N183 Chronic kidney disease, stage 3 unspecified: Secondary | ICD-10-CM

## 2019-06-19 DIAGNOSIS — F17210 Nicotine dependence, cigarettes, uncomplicated: Secondary | ICD-10-CM | POA: Diagnosis present

## 2019-06-19 DIAGNOSIS — Z5181 Encounter for therapeutic drug level monitoring: Secondary | ICD-10-CM | POA: Diagnosis not present

## 2019-06-19 DIAGNOSIS — E785 Hyperlipidemia, unspecified: Secondary | ICD-10-CM | POA: Diagnosis present

## 2019-06-19 DIAGNOSIS — E669 Obesity, unspecified: Secondary | ICD-10-CM | POA: Diagnosis present

## 2019-06-19 DIAGNOSIS — I248 Other forms of acute ischemic heart disease: Secondary | ICD-10-CM | POA: Diagnosis present

## 2019-06-19 DIAGNOSIS — Z6837 Body mass index (BMI) 37.0-37.9, adult: Secondary | ICD-10-CM

## 2019-06-19 DIAGNOSIS — N1832 Chronic kidney disease, stage 3b: Secondary | ICD-10-CM | POA: Diagnosis present

## 2019-06-19 DIAGNOSIS — F1490 Cocaine use, unspecified, uncomplicated: Secondary | ICD-10-CM | POA: Diagnosis not present

## 2019-06-19 DIAGNOSIS — I5043 Acute on chronic combined systolic (congestive) and diastolic (congestive) heart failure: Secondary | ICD-10-CM

## 2019-06-19 DIAGNOSIS — R9431 Abnormal electrocardiogram [ECG] [EKG]: Secondary | ICD-10-CM | POA: Diagnosis not present

## 2019-06-19 DIAGNOSIS — R079 Chest pain, unspecified: Secondary | ICD-10-CM

## 2019-06-19 LAB — CBC WITH DIFFERENTIAL/PLATELET
Abs Immature Granulocytes: 0.01 10*3/uL (ref 0.00–0.07)
Basophils Absolute: 0 10*3/uL (ref 0.0–0.1)
Basophils Relative: 1 %
Eosinophils Absolute: 0.2 10*3/uL (ref 0.0–0.5)
Eosinophils Relative: 4 %
HCT: 34.4 % — ABNORMAL LOW (ref 36.0–46.0)
Hemoglobin: 10.1 g/dL — ABNORMAL LOW (ref 12.0–15.0)
Immature Granulocytes: 0 %
Lymphocytes Relative: 31 %
Lymphs Abs: 1.3 10*3/uL (ref 0.7–4.0)
MCH: 22.1 pg — ABNORMAL LOW (ref 26.0–34.0)
MCHC: 29.4 g/dL — ABNORMAL LOW (ref 30.0–36.0)
MCV: 75.3 fL — ABNORMAL LOW (ref 80.0–100.0)
Monocytes Absolute: 0.8 10*3/uL (ref 0.1–1.0)
Monocytes Relative: 19 %
Neutro Abs: 1.9 10*3/uL (ref 1.7–7.7)
Neutrophils Relative %: 45 %
Platelets: 336 10*3/uL (ref 150–400)
RBC: 4.57 MIL/uL (ref 3.87–5.11)
RDW: 20.8 % — ABNORMAL HIGH (ref 11.5–15.5)
WBC: 4.2 10*3/uL (ref 4.0–10.5)
nRBC: 0 % (ref 0.0–0.2)

## 2019-06-19 LAB — COMPREHENSIVE METABOLIC PANEL
ALT: 23 U/L (ref 0–44)
AST: 31 U/L (ref 15–41)
Albumin: 2.9 g/dL — ABNORMAL LOW (ref 3.5–5.0)
Alkaline Phosphatase: 211 U/L — ABNORMAL HIGH (ref 38–126)
Anion gap: 12 (ref 5–15)
BUN: 36 mg/dL — ABNORMAL HIGH (ref 8–23)
CO2: 26 mmol/L (ref 22–32)
Calcium: 8.9 mg/dL (ref 8.9–10.3)
Chloride: 102 mmol/L (ref 98–111)
Creatinine, Ser: 2.48 mg/dL — ABNORMAL HIGH (ref 0.44–1.00)
GFR calc Af Amer: 23 mL/min — ABNORMAL LOW (ref >60)
GFR calc non Af Amer: 20 mL/min — ABNORMAL LOW (ref >60)
Glucose, Bld: 103 mg/dL — ABNORMAL HIGH (ref 70–99)
Potassium: 3.3 mmol/L — ABNORMAL LOW (ref 3.5–5.1)
Sodium: 140 mmol/L (ref 135–145)
Total Bilirubin: 1.7 mg/dL — ABNORMAL HIGH (ref 0.3–1.2)
Total Protein: 6.1 g/dL — ABNORMAL LOW (ref 6.5–8.1)

## 2019-06-19 LAB — TROPONIN I (HIGH SENSITIVITY)
Troponin I (High Sensitivity): 197 ng/L (ref <18)
Troponin I (High Sensitivity): 211 ng/L (ref <18)
Troponin I (High Sensitivity): 191 ng/L (ref <18)
Troponin I (High Sensitivity): 190 ng/L (ref <18)

## 2019-06-19 LAB — SARS CORONAVIRUS 2 BY RT PCR (HOSPITAL ORDER, PERFORMED IN ~~LOC~~ HOSPITAL LAB): SARS Coronavirus 2: NEGATIVE

## 2019-06-19 LAB — BRAIN NATRIURETIC PEPTIDE: B Natriuretic Peptide: >4500 pg/mL — ABNORMAL HIGH (ref 0.0–100.0)

## 2019-06-19 MED ORDER — ONDANSETRON HCL 4 MG/2ML IJ SOLN: 4.0000 mg | Freq: Four times a day (QID) | INTRAMUSCULAR | Status: DC | PRN

## 2019-06-19 MED ORDER — PANTOPRAZOLE SODIUM 40 MG PO TBEC
40.0000 mg | DELAYED_RELEASE_TABLET | Freq: Every day | ORAL | Status: DC
  Administered 2019-06-19 – 2019-06-24 (×6): 40 mg via ORAL
  Filled 2019-06-19 (×6): qty 1

## 2019-06-19 MED ORDER — LORATADINE 10 MG PO TABS: 10.0000 mg | ORAL_TABLET | Freq: Every day | ORAL | Status: DC | PRN

## 2019-06-19 MED ORDER — COLCHICINE 0.6 MG PO TABS: 0.6000 mg | ORAL_TABLET | ORAL | Status: DC

## 2019-06-19 MED ORDER — SODIUM CHLORIDE 0.9% FLUSH
3.0000 mL | Freq: Two times a day (BID) | INTRAVENOUS | Status: DC
  Administered 2019-06-19 – 2019-06-20 (×2): 3 mL via INTRAVENOUS

## 2019-06-19 MED ORDER — FLUOXETINE HCL 20 MG PO CAPS
20.0000 mg | ORAL_CAPSULE | Freq: Every day | ORAL | Status: DC
  Administered 2019-06-19 – 2019-06-20 (×2): 20 mg via ORAL
  Filled 2019-06-19 (×2): qty 1

## 2019-06-19 MED ORDER — RIVAROXABAN 20 MG PO TABS
20.0000 mg | ORAL_TABLET | Freq: Every day | ORAL | Status: DC
  Administered 2019-06-19 – 2019-06-23 (×5): 20 mg via ORAL
  Filled 2019-06-19 (×5): qty 1

## 2019-06-19 MED ORDER — FUROSEMIDE 10 MG/ML IJ SOLN
40.0000 mg | Freq: Once | INTRAMUSCULAR | Status: AC
  Administered 2019-06-19: 40 mg via INTRAVENOUS
  Filled 2019-06-19: qty 4

## 2019-06-19 MED ORDER — SODIUM CHLORIDE 0.9% FLUSH: 3.0000 mL | INTRAVENOUS | Status: DC | PRN

## 2019-06-19 MED ORDER — DIPHENHYDRAMINE HCL 25 MG PO CAPS
25.0000 mg | ORAL_CAPSULE | Freq: Once | ORAL | Status: AC
  Administered 2019-06-19: 25 mg via ORAL
  Filled 2019-06-19: qty 1

## 2019-06-19 MED ORDER — ASPIRIN 81 MG PO CHEW
81.0000 mg | CHEWABLE_TABLET | Freq: Every day | ORAL | Status: DC
  Administered 2019-06-19 – 2019-06-24 (×6): 81 mg via ORAL
  Filled 2019-06-19 (×6): qty 1

## 2019-06-19 MED ORDER — ATORVASTATIN CALCIUM 20 MG PO TABS
20.0000 mg | ORAL_TABLET | Freq: Every day | ORAL | Status: DC
  Administered 2019-06-19 – 2019-06-23 (×5): 20 mg via ORAL
  Filled 2019-06-19 (×5): qty 1

## 2019-06-19 MED ORDER — ALLOPURINOL 100 MG PO TABS
100.0000 mg | ORAL_TABLET | Freq: Every day | ORAL | Status: DC
  Administered 2019-06-19 – 2019-06-24 (×6): 100 mg via ORAL
  Filled 2019-06-19 (×7): qty 1

## 2019-06-19 MED ORDER — POTASSIUM CHLORIDE CRYS ER 20 MEQ PO TBCR
40.0000 meq | EXTENDED_RELEASE_TABLET | Freq: Once | ORAL | Status: AC
  Administered 2019-06-19: 40 meq via ORAL
  Filled 2019-06-19: qty 2

## 2019-06-19 MED ORDER — ASCORBIC ACID 500 MG PO TABS
500.0000 mg | ORAL_TABLET | Freq: Every day | ORAL | Status: DC
  Administered 2019-06-19 – 2019-06-24 (×6): 500 mg via ORAL
  Filled 2019-06-19 (×6): qty 1

## 2019-06-19 MED ORDER — NITROGLYCERIN 0.4 MG SL SUBL
0.4000 mg | SUBLINGUAL_TABLET | SUBLINGUAL | Status: DC | PRN
  Administered 2019-06-23: 0.4 mg via SUBLINGUAL
  Filled 2019-06-19: qty 1

## 2019-06-19 MED ORDER — SACUBITRIL-VALSARTAN 49-51 MG PO TABS
1.0000 | ORAL_TABLET | Freq: Two times a day (BID) | ORAL | Status: DC
  Administered 2019-06-19 – 2019-06-24 (×10): 1 via ORAL
  Filled 2019-06-19 (×11): qty 1

## 2019-06-19 MED ORDER — IPRATROPIUM-ALBUTEROL 0.5-2.5 (3) MG/3ML IN SOLN
3.0000 mL | Freq: Four times a day (QID) | RESPIRATORY_TRACT | Status: DC
  Administered 2019-06-19: 3 mL via RESPIRATORY_TRACT
  Filled 2019-06-19 (×2): qty 3

## 2019-06-19 MED ORDER — BUMETANIDE 0.25 MG/ML IJ SOLN
1.0000 mg | Freq: Two times a day (BID) | INTRAMUSCULAR | Status: DC
  Administered 2019-06-19 – 2019-06-23 (×9): 1 mg via INTRAVENOUS
  Filled 2019-06-19 (×11): qty 4

## 2019-06-19 MED ORDER — LORAZEPAM 2 MG/ML IJ SOLN
1.0000 mg | Freq: Once | INTRAMUSCULAR | Status: AC
  Administered 2019-06-19: 1 mg via INTRAVENOUS
  Filled 2019-06-19: qty 1

## 2019-06-19 MED ORDER — ACETAMINOPHEN 325 MG PO TABS
650.0000 mg | ORAL_TABLET | ORAL | Status: DC | PRN
  Administered 2019-06-19 – 2019-06-24 (×5): 650 mg via ORAL
  Filled 2019-06-19 (×5): qty 2

## 2019-06-19 MED ORDER — CARVEDILOL 3.125 MG PO TABS
3.1250 mg | ORAL_TABLET | Freq: Two times a day (BID) | ORAL | Status: DC
  Administered 2019-06-20 – 2019-06-21 (×3): 3.125 mg via ORAL
  Filled 2019-06-19 (×3): qty 1

## 2019-06-19 MED ORDER — SODIUM CHLORIDE 0.9 % IV SOLN
250.0000 mL | INTRAVENOUS | Status: DC | PRN
  Administered 2019-06-20: 250 mL via INTRAVENOUS

## 2019-06-19 NOTE — H&P (Signed)
History and Physical    Katelyn Lane EXB:[id number] DOB: 14-Mar-1955 DOA: 06/19/2019  PCP: [name], MD   Patient coming from: Home  I have personally briefly reviewed patient's old medical records in [place]  Chief Complaint:  Shortness of  breath  HPI: Katelyn Lane is a 63 y.o. 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  Shortness of breath is also associated with increased lower extremity swelling as well as tightness in her chest.  Patient feels like she is gaining weight and not voiding as much despite taking her diuretics. She denies having any nausea, vomiting, palpitations, diaphoresis, dizziness, lightheadedness, cough, fever or chills Chest x-ray shows cardiomegaly without edema Twelve-lead EKG shows sinus rhythm with frequent PVCs and LVH Labs reveal significantly elevated BNP level of > 4500 as well as worsening renal function from her baseline.  Patient had a serum creatinine of 2 about 2 weeks ago and today on admission it is 2.48. Troponin is elevated as well   ED Course: Patient with multiple medical problems and frequent visits to the emergency room who presents today for evaluation of worsening shortness of breath, bilateral lower extremity swelling and increased weight gain despite taking her diuretics.  She has worsening of her renal function from her baseline and has elevated troponin levels.   Review of Systems: As per HPI otherwise 10 point review of systems negative.    Past Medical History:  Diagnosis Date  . CHF (congestive heart failure) ([place])   . COVID-19   . Hyperlipidemia   . Hypertension   . Renal disorder     Past Surgical  History:  Procedure Laterality Date  . RIGHT/LEFT HEART CATH AND CORONARY ANGIOGRAPHY N/A 12/30/2018   Procedure: RIGHT/LEFT HEART CATH AND CORONARY ANGIOGRAPHY;  Surgeon: Corey Skains, MD;  Location: [facility];  Service: Cardiovascular;  Laterality: N/A;     reports that she has been smoking. She has never used smokeless tobacco. She reports current drug use. Drug: Cocaine. She reports that she does not drink alcohol.  Allergies  Allergen Reactions  . Penicillins Hives, Rash and Other (See Comments)    Did it involve swelling of the face/tongue/throat, SOB, or low BP? No Did it involve sudden or severe rash/hives, skin peeling, or any reaction on the inside of your mouth or nose? Yes Did you need to seek medical attention at a hospital or doctor's office? Yes When did it last happen? >25 years If all above answers are "NO", may proceed with cephalosporin use.   . Ace Inhibitors Nausea And Vomiting    Family History  Problem Relation Age of Onset  . Breast cancer Neg Hx      Prior to Admission medications   Medication Sig Start Date End Date Taking? Authorizing Provider  albuterol (VENTOLIN HFA) 108 (90 Base) MCG/ACT inhaler Inhale 2 puffs into the lungs every 6 (six) hours as needed for wheezing or shortness of breath.    [provider]  allopurinol (ZYLOPRIM) 100 MG tablet Take 1 tablet (100 mg total) by mouth daily. 06/06/19 07/06/19  Loletha Grayer, MD  ascorbic acid (VITAMIN C) 500 MG tablet Take 500 mg by mouth daily.    [provider]  aspirin 81 MG chewable tablet Chew 1 tablet (81 mg total) by mouth daily. 05/03/19   Charlynne Cousins, MD  atorvastatin (LIPITOR) 20 MG tablet Take 1 tablet (20 mg total) by mouth daily at 6 PM. 05/03/19 08/01/19  Charlynne Cousins, MD  carvedilol (COREG) 3.125 MG tablet Take 1 tablet (3.125 mg total) by mouth 2 (two) times daily with a meal. 05/03/19 08/01/19  Charlynne Cousins, MD  colchicine 0.6  MG tablet Take 1 tablet (0.6 mg total) by mouth every other day for 4 doses. 06/06/19 06/13/19  Loletha Grayer, MD  docusate calcium (SURFAK) 240 MG capsule Take 240 mg by mouth daily as needed for moderate constipation.     [provider]  FLUoxetine (PROZAC) 20 MG capsule Take 1 capsule (20 mg total) by mouth daily. 05/03/19   Charlynne Cousins, MD  furosemide (LASIX) 40 MG tablet Take 2 tablets (80 mg total) by mouth daily. 05/03/19 08/01/19  Charlynne Cousins, MD  Ipratropium-Albuterol (COMBIVENT) 20-100 MCG/ACT AERS respimat Inhale 1 puff into the lungs every 6 (six) hours. 05/26/19   Loletha Grayer, MD  loratadine (CLARITIN) 10 MG tablet Take 10 mg by mouth daily as needed for allergies.     [provider]  nitroGLYCERIN (NITROSTAT) 0.4 MG SL tablet Place 1 tablet (0.4 mg total) under the tongue every 5 (five) minutes as needed for chest pain. 05/03/19   Charlynne Cousins, MD  pantoprazole (PROTONIX) 40 MG tablet Take 1 tablet (40 mg total) by mouth daily. 05/03/19   Charlynne Cousins, MD  predniSONE (DELTASONE) 10 MG tablet One tablet po daily for three days then 1/2 tablet daily for 4 days 06/06/19   Loletha Grayer, MD  rivaroxaban (XARELTO) 20 MG TABS tablet Take 1 tablet (20 mg total) by mouth daily with supper. For the blood clot in your heart. 05/03/19 08/01/19  Charlynne Cousins, MD  sacubitril-valsartan (ENTRESTO) 49-51 MG Take 1 tablet by mouth 2 (two) times daily. 05/26/19   Loletha Grayer, MD    Physical Exam: Vitals:   06/19/19 0456 06/19/19 0830 06/19/19 0832  BP: (!) 119/97 (!) 136/105   Pulse: 84 87 82  Resp: (!) 26 (!) 29 19  Temp: 98 F (36.7 C)    TempSrc: Oral    SpO2: 99% 97% 100%  Weight: 98.4 kg    Height: 5\' 7"  (1.702 m)       Vitals:   06/19/19 0456 06/19/19 0830 06/19/19 0832  BP: (!) 119/97 (!) 136/105   Pulse: 84 87 82  Resp: (!) 26 (!) 29 19  Temp: 98 F (36.7 C)    TempSrc: Oral    SpO2: 99% 97% 100%  Weight: 98.4  kg    Height: 5\' 7"  (1.702 m)      Constitutional: NAD, alert and oriented x 3. Appears to be in mild respiratory distress Eyes: PERRL, lids and conjunctivae normal ENMT: Mucous membranes are moist.  Neck: normal, supple, no masses, no thyromegaly Respiratory: Crackles at the bases, no wheezing, Normal respiratory effort. No accessory muscle use.  Cardiovascular: Regular rate and rhythm, no murmurs / rubs / gallops. 4+ extremity edema. 2+ pedal pulses. No carotid bruits.  Abdomen: no tenderness, no masses palpated. No hepatosplenomegaly. Bowel sounds positive.  Musculoskeletal: no clubbing / cyanosis. No joint deformity upper and lower extremities.  Skin: no rashes, lesions, ulcers.  Neurologic: No gross focal neurologic deficit. Psychiatric: Normal mood and affect.   Labs on Admission: I have personally reviewed  following labs and imaging studies  CBC: Recent Labs  Lab 06/19/19 0509  WBC 4.2  NEUTROABS 1.9  HGB 10.1*  HCT 34.4*  MCV 75.3*  PLT 124   Basic Metabolic Panel: Recent Labs  Lab 06/19/19 0509  NA 140  K 3.3*  CL 102  CO2 26  GLUCOSE 103*  BUN 36*  CREATININE 2.48*  CALCIUM 8.9   GFR: Estimated Creatinine Clearance: 28 mL/min (A) (by C-G formula based on SCr of 2.48 mg/dL (H)). Liver Function Tests: Recent Labs  Lab 06/19/19 0509  AST 31  ALT 23  ALKPHOS 211*  BILITOT 1.7*  PROT 6.1*  ALBUMIN 2.9*   No results for input(s): LIPASE, AMYLASE in the last 168 hours. No results for input(s): AMMONIA in the last 168 hours. Coagulation Profile: No results for input(s): INR, PROTIME in the last 168 hours. Cardiac Enzymes: No results for input(s): CKTOTAL, CKMB, CKMBINDEX, TROPONINI in the last 168 hours. BNP (last 3 results) No results for input(s): PROBNP in the last 8760 hours. HbA1C: No results for input(s): HGBA1C in the last 72 hours. CBG: No results for input(s): GLUCAP in the last 168 hours. Lipid Profile: No results for input(s): CHOL,  HDL, LDLCALC, TRIG, CHOLHDL, LDLDIRECT in the last 72 hours. Thyroid Function Tests: No results for input(s): TSH, T4TOTAL, FREET4, T3FREE, THYROIDAB in the last 72 hours. Anemia Panel: No results for input(s): VITAMINB12, FOLATE, FERRITIN, TIBC, IRON, RETICCTPCT in the last 72 hours. Urine analysis:    Component Value Date/Time   COLORURINE YELLOW (A) 05/21/2019 2116   APPEARANCEUR HAZY (A) 05/21/2019 2116   LABSPEC 1.010 05/21/2019 2116   PHURINE 7.0 05/21/2019 2116   GLUCOSEU NEGATIVE 05/21/2019 2116   HGBUR NEGATIVE 05/21/2019 2116   BILIRUBINUR NEGATIVE 05/21/2019 2116   [place] NEGATIVE 05/21/2019 2116   PROTEINUR 30 (A) 05/21/2019 2116   NITRITE NEGATIVE 05/21/2019 2116   LEUKOCYTESUR NEGATIVE 05/21/2019 2116    Radiological Exams on Admission: DG Chest 2 View  Result Date: 06/19/2019 CLINICAL DATA:  Cough and leg swelling EXAM: CHEST - 2 VIEW COMPARISON:  06/03/2019 FINDINGS: Cardiopericardial enlargement. There is no edema, consolidation, effusion, or pneumothorax. No acute osseous finding IMPRESSION: Cardiomegaly without edema. Electronically Signed   By: Monte Fantasia M.D.   On: 06/19/2019 05:38    EKG: Independently reviewed.  Sinus rhythm with frequent PVCs LVH  Assessment/Plan Principal Problem:   Acute on chronic combined systolic and diastolic CHF (congestive heart failure) (HCC) Active Problems:   Dilated cardiomyopathy (HCC)   Elevated troponin   CKD (chronic kidney disease), stage III   History of anemia due to chronic kidney disease    1. Acute on chronic combined systolic and diastolic CHF Patient presents for evaluation of worsening shortness of breath and chest pain associated with weight gain and bilateral lower extremity swelling. Lastknown LVEF is less than 20% aswell is grade 3 diastolic dysfunction CXR showscardiomegaly with no evidence of edema  Place patient on IV diuresis with Bumex Continue Aldactone,Entresto and  carvedilol Request cardiology consult Continue low-sodium diet and fluid restriction   2. Elevated troponin I. Most likely secondary to demand ischemia from acute CHF We will trend troponin levels Continue aspirin   3. Depression. Continue Prozac.    4. Historyof cardiac thrombus Continue anticoagulation with Xarelto   5.Chronic kidney disease stage III Patient noted to have worsening of her renal function from baseline At baseline her serum creatinine is about 2 today on admission it is 2.48 We will monitor renal  function closely while diuresing patient I will request nephrology consult if renal function continues to worsen   6.  Polysubstance abuse Patient has been counseled on the need to abstain from illicit drug use    DVT prophylaxis: Xarelto Code Status: Full code Family Communication: Greater than 50% of time was spent discussing plan of care with patient at the bedside.  All questions and concerns have been addressed.  She verbalizes understanding and agrees with the plan. Disposition Plan: Back to previous home environment Consults called: Case management    Tine Mabee MD [facility]     06/19/2019, 10:25 AM

## 2019-06-19 NOTE — Progress Notes (Addendum)
Pt admitted to room 248 and pt made comfortable in bed and oriented to room and call light, pt placed on tele monitoring and verified by another RN. Pt instructed to call for help before getting out of bed. Pt denies any pain at this time. Zero need voiced now.

## 2019-06-19 NOTE — ED Notes (Signed)
Pt requesting to be placed on 2L. This RN placed patient on 2L for comfort. Pt still tachypneic at this time. Pt oxygen 96% on 2L.

## 2019-06-19 NOTE — ED Triage Notes (Signed)
PT AMBULATORY FROM PARKING LOT TO THE ER. PT C/O SOB, LOWER LEG SWELLING, COUGH. PT HAD COVID IN February.

## 2019-06-19 NOTE — Progress Notes (Signed)
Noted patient sometimes drifts off to sleep during conversation. Patient remains oriented but will monitor this with patient closely.

## 2019-06-19 NOTE — ED Provider Notes (Signed)
[facility] Emergency Department Provider Note   ____________________________________________   First MD Initiated Contact with Patient 06/19/19 0559     (approximate)  I have reviewed the triage vital signs and the nursing notes.   HISTORY  Chief Complaint Shortness of Breath    HPI Katelyn Lane is a 64 y.o. female with past medical history of CAD, CHF, CKD, COPD, hypertension, hyperlipidemia, and ventricular thrombus on Xarelto who presents to the ED complaining of shortness of breath.  Patient reports she has noticed increased swelling in her lower extremities as well as increasing difficulty breathing over the past 3 to 4 days.  She states her breathing is typically worse when she goes to walk walk around and it is been associated with some tightness in her chest.  She feels like she is gaining weight and not urinating as much as she had in the past.  She reports taking her Lasix as prescribed.  She currently has minimal shortness of breath at rest.        Past Medical History:  Diagnosis Date  . CHF (congestive heart failure) ([place])   . COVID-19   . Hyperlipidemia   . Hypertension   . Renal disorder     Patient Active Problem List   Diagnosis Date Noted  . CHF, acute on chronic ([place]) 06/03/2019  . Acute on chronic combined systolic (congestive) and diastolic (congestive) heart failure ([place]) 06/03/2019  . Accelerated hypertension   . Ventricular tachycardia, non-sustained ([place])   . Chronic obstructive pulmonary disease ([place])   . Pulmonary hypertension ([place])   . Acute metabolic encephalopathy 25/36/6440  . History of substance abuse ([place]) 05/21/2019  . CHF (congestive heart failure) ([place]) 05/21/2019  . Acute systolic CHF (congestive heart failure) ([place]) 04/08/2019  . Acute on chronic combined systolic and diastolic CHF (congestive heart failure) ([place]) 03/24/2019  . COVID-19 03/06/2019  . Acute respiratory failure with hypoxia ([place])  03/06/2019  . Hypokalemia 03/06/2019  . LV (left ventricular) mural thrombus 03/06/2019  . Stage 3b chronic kidney disease 03/06/2019  . Nausea and vomiting 02/16/2019  . Abdominal pain 02/16/2019  . Rhabdomyolysis 02/16/2019  . Acute renal failure superimposed on stage 3 chronic kidney disease ([place]) 02/16/2019  . Crack cocaine use 02/16/2019  . Tobacco abuse 02/16/2019  . Acute on chronic systolic CHF (congestive heart failure) ([place]) 02/16/2019  . Acute congestive heart failure ([place]) 02/13/2019  . Respiratory failure with hypoxia ([place]) 01/31/2019  . Stroke ([place]) 01/18/2019  . Dilated cardiomyopathy ([place]) 12/31/2018  . Chest tightness 12/31/2018  . Elevated troponin 12/31/2018  . Hypertensive urgency 12/31/2018  . Hyperlipidemia 12/31/2018  . Depression 12/31/2018  . HFrEF (heart failure with reduced ejection fraction) ([place]) 12/29/2018    Past Surgical History:  Procedure Laterality Date  . RIGHT/LEFT HEART CATH AND CORONARY ANGIOGRAPHY N/A 12/30/2018   Procedure: RIGHT/LEFT HEART CATH AND CORONARY ANGIOGRAPHY;  Surgeon: Corey Skains, MD;  Location: [place] [facility];  Service: Cardiovascular;  Laterality: N/A;    Prior to Admission medications   Medication Sig Start Date End Date Taking? Authorizing Provider  albuterol (VENTOLIN HFA) 108 (90 Base) MCG/ACT inhaler Inhale 2 puffs into the lungs every 6 (six) hours as needed for wheezing or shortness of breath.    [provider]  allopurinol (ZYLOPRIM) 100 MG tablet Take 1 tablet (100 mg total) by mouth daily. 06/06/19 07/06/19  Loletha Grayer, MD  ascorbic acid (VITAMIN C) 500 MG tablet Take 500 mg by mouth daily.  [provider]  aspirin 81 MG chewable tablet Chew 1 tablet (81 mg total) by mouth daily. 05/03/19   Charlynne Cousins, MD  atorvastatin (LIPITOR) 20 MG tablet Take 1 tablet (20 mg total) by mouth daily at 6 PM. 05/03/19 08/01/19  Charlynne Cousins, MD  carvedilol (COREG) 3.125 MG tablet  Take 1 tablet (3.125 mg total) by mouth 2 (two) times daily with a meal. 05/03/19 08/01/19  Charlynne Cousins, MD  colchicine 0.6 MG tablet Take 1 tablet (0.6 mg total) by mouth every other day for 4 doses. 06/06/19 06/13/19  Loletha Grayer, MD  docusate calcium (SURFAK) 240 MG capsule Take 240 mg by mouth daily as needed for moderate constipation.     [provider]  FLUoxetine (PROZAC) 20 MG capsule Take 1 capsule (20 mg total) by mouth daily. 05/03/19   Charlynne Cousins, MD  furosemide (LASIX) 40 MG tablet Take 2 tablets (80 mg total) by mouth daily. 05/03/19 08/01/19  Charlynne Cousins, MD  Ipratropium-Albuterol (COMBIVENT) 20-100 MCG/ACT AERS respimat Inhale 1 puff into the lungs every 6 (six) hours. 05/26/19   Loletha Grayer, MD  loratadine (CLARITIN) 10 MG tablet Take 10 mg by mouth daily as needed for allergies.     [provider]  nitroGLYCERIN (NITROSTAT) 0.4 MG SL tablet Place 1 tablet (0.4 mg total) under the tongue every 5 (five) minutes as needed for chest pain. 05/03/19   Charlynne Cousins, MD  pantoprazole (PROTONIX) 40 MG tablet Take 1 tablet (40 mg total) by mouth daily. 05/03/19   Charlynne Cousins, MD  predniSONE (DELTASONE) 10 MG tablet One tablet po daily for three days then 1/2 tablet daily for 4 days 06/06/19   Loletha Grayer, MD  rivaroxaban (XARELTO) 20 MG TABS tablet Take 1 tablet (20 mg total) by mouth daily with supper. For the blood clot in your heart. 05/03/19 08/01/19  Charlynne Cousins, MD  sacubitril-valsartan (ENTRESTO) 49-51 MG Take 1 tablet by mouth 2 (two) times daily. 05/26/19   Loletha Grayer, MD    Allergies Penicillins and Ace inhibitors  Family History  Problem Relation Age of Onset  . Breast cancer Neg Hx     Social History Social History   Tobacco Use  . Smoking status: Current Every Day Smoker  . Smokeless tobacco: Never Used  Substance Use Topics  . Alcohol use: No    Alcohol/week: 0.0 standard drinks  .  Drug use: Yes    Types: Cocaine    Comment: 4-5 days ago    Review of Systems  Constitutional: No fever/chills Eyes: No visual changes. ENT: No sore throat. Cardiovascular: Denies chest pain. Respiratory: Positive for shortness of breath. Gastrointestinal: No abdominal pain.  No nausea, no vomiting.  No diarrhea.  No constipation. Genitourinary: Negative for dysuria. Musculoskeletal: Negative for back pain.  Positive for leg swelling. Skin: Negative for rash. Neurological: Negative for headaches, focal weakness or numbness.  ____________________________________________   PHYSICAL EXAM:  VITAL SIGNS: ED Triage Vitals [06/19/19 0456]  Enc Vitals Group     BP (!) 119/97     Pulse Rate 84     Resp (!) 26     Temp 98 F (36.7 C)     Temp Source Oral     SpO2 99 %     Weight 217 lb (98.4 kg)     Height 5\' 7"  (1.702 m)     Head Circumference      Peak Flow  Pain Score 0     Pain Loc      Pain Edu?      Excl. in [place]?     Constitutional: Alert and oriented. Eyes: Conjunctivae are normal. Head: Atraumatic. Nose: No congestion/rhinnorhea. Mouth/Throat: Mucous membranes are moist. Neck: Normal ROM Cardiovascular: Normal rate, regular rhythm. Grossly normal heart sounds. Respiratory: Normal respiratory effort.  No retractions. Lungs CTAB. Gastrointestinal: Soft and nontender. No distention. Genitourinary: deferred Musculoskeletal: No lower extremity tenderness, 2+ pitting edema to knees bilaterally. Neurologic:  Normal speech and language. No gross focal neurologic deficits are appreciated. Skin:  Skin is warm, dry and intact. No rash noted. Psychiatric: Mood and affect are normal. Speech and behavior are normal.  ____________________________________________   LABS (all labs ordered are listed, but only abnormal results are displayed)  Labs Reviewed  CBC WITH DIFFERENTIAL/PLATELET - Abnormal; Notable for the following components:      Result Value   Hemoglobin  10.1 (*)    HCT 34.4 (*)    MCV 75.3 (*)    MCH 22.1 (*)    MCHC 29.4 (*)    RDW 20.8 (*)    All other components within normal limits  COMPREHENSIVE METABOLIC PANEL - Abnormal; Notable for the following components:   Potassium 3.3 (*)    Glucose, Bld 103 (*)    BUN 36 (*)    Creatinine, Ser 2.48 (*)    Total Protein 6.1 (*)    Albumin 2.9 (*)    Alkaline Phosphatase 211 (*)    Total Bilirubin 1.7 (*)    GFR calc non Af Amer 20 (*)    GFR calc Af Amer 23 (*)    All other components within normal limits  BRAIN NATRIURETIC PEPTIDE - Abnormal; Notable for the following components:   B Natriuretic Peptide >4,500.0 (*)    All other components within normal limits  TROPONIN I (HIGH SENSITIVITY) - Abnormal; Notable for the following components:   Troponin I (High Sensitivity) 197 (*)    All other components within normal limits  TROPONIN I (HIGH SENSITIVITY)   ____________________________________________  EKG  ED ECG REPORT I, Blake Divine, the attending physician, personally viewed and interpreted this ECG.   Date: 06/19/2019  EKG Time: 5:06  Rate: 82  Rhythm: normal sinus rhythm, frequent PVCs  Axis: LAD  Intervals:none  ST&T Change: None   PROCEDURES  Procedure(s) performed (including Critical Care):  Procedures   ____________________________________________   INITIAL IMPRESSION / ASSESSMENT AND PLAN / ED COURSE       64 year old female with history of CAD, CHF, CKD, COPD, hypertension, hyperlipidemia, and ventricular thrombus on Xarelto who presents to the ED complaining of increased lower extremity swelling and dyspnea on exertion.  She is not in any respiratory distress upon arrival and is maintaining O2 sats on room air.  Chest x-ray shows cardiomegaly but no evidence of acute pulmonary edema.  She does clinically appear fluid overloaded with lower extremity edema, we will treat with small dose of IV Lasix given her chronic kidney disease.  Her renal  function does appear to be at her baseline with initial troponin also elevated but similar to baseline.  Low suspicion for ACS given her atypical chest pain, will trend troponin.  Remainder of lab work is unremarkable, BNP is chronically undetectably high.  We will plan to ambulate patient on pulse ox following administration of IV Lasix and if she is able to maintain O2 sats without significant difficulty breathing, she would be appropriate for discharge  home with heart failure clinic follow-up.      ____________________________________________   FINAL CLINICAL IMPRESSION(S) / ED DIAGNOSES  Final diagnoses:  Acute on chronic systolic congestive heart failure (HCC)  Chest pain, unspecified type     ED Discharge Orders         Ordered    AMB referral to CHF clinic     Discontinue  Reprint     06/19/19 0712           Note:  This document was prepared using Dragon voice recognition software and may include unintentional dictation errors.   Blake Divine, MD 06/19/19 [phone]

## 2019-06-19 NOTE — ED Provider Notes (Signed)
Patient's troponin elevated (she does have chronic elevation), however today is slightly above her baseline and rising on her delta. No acute ischemic changes noted on her EKG, however concerned that this represents increased demand in the setting of HF exacerbation. BNP >4500. Creatinine also slightly elevated from her priors, ~2.5 from ~2 previously.   Given above, will plan to admit for HF exacerbation, diuresis. Patient agreeable.   Lilia Pro., MD 06/19/19 [phone]

## 2019-06-20 DIAGNOSIS — N189 Chronic kidney disease, unspecified: Secondary | ICD-10-CM

## 2019-06-20 DIAGNOSIS — R9431 Abnormal electrocardiogram [ECG] [EKG]: Secondary | ICD-10-CM

## 2019-06-20 DIAGNOSIS — Z862 Personal history of diseases of the blood and blood-forming organs and certain disorders involving the immune mechanism: Secondary | ICD-10-CM

## 2019-06-20 DIAGNOSIS — Z9119 Patient's noncompliance with other medical treatment and regimen: Secondary | ICD-10-CM

## 2019-06-20 DIAGNOSIS — F1490 Cocaine use, unspecified, uncomplicated: Secondary | ICD-10-CM

## 2019-06-20 LAB — BASIC METABOLIC PANEL
Anion gap: 12 (ref 5–15)
BUN: 39 mg/dL — ABNORMAL HIGH (ref 8–23)
CO2: 26 mmol/L (ref 22–32)
Calcium: 8.5 mg/dL — ABNORMAL LOW (ref 8.9–10.3)
Chloride: 100 mmol/L (ref 98–111)
Creatinine, Ser: 2.3 mg/dL — ABNORMAL HIGH (ref 0.44–1.00)
GFR calc Af Amer: 25 mL/min — ABNORMAL LOW (ref >60)
GFR calc non Af Amer: 22 mL/min — ABNORMAL LOW (ref >60)
Glucose, Bld: 146 mg/dL — ABNORMAL HIGH (ref 70–99)
Potassium: 3.8 mmol/L (ref 3.5–5.1)
Sodium: 138 mmol/L (ref 135–145)

## 2019-06-20 MED ORDER — SENNOSIDES-DOCUSATE SODIUM 8.6-50 MG PO TABS
1.0000 | ORAL_TABLET | Freq: Two times a day (BID) | ORAL | Status: DC
  Administered 2019-06-20 – 2019-06-24 (×8): 1 via ORAL
  Filled 2019-06-20 (×9): qty 1

## 2019-06-20 MED ORDER — GABAPENTIN 100 MG PO CAPS
100.0000 mg | ORAL_CAPSULE | Freq: Once | ORAL | Status: AC
  Administered 2019-06-20: 100 mg via ORAL
  Filled 2019-06-20: qty 1

## 2019-06-20 MED ORDER — POTASSIUM CHLORIDE CRYS ER 20 MEQ PO TBCR
40.0000 meq | EXTENDED_RELEASE_TABLET | Freq: Once | ORAL | Status: AC
  Administered 2019-06-20: 40 meq via ORAL
  Filled 2019-06-20: qty 2

## 2019-06-20 MED ORDER — IPRATROPIUM-ALBUTEROL 0.5-2.5 (3) MG/3ML IN SOLN: 3.0000 mL | Freq: Four times a day (QID) | RESPIRATORY_TRACT | Status: DC | PRN

## 2019-06-20 MED ORDER — MAGNESIUM SULFATE IN D5W 1-5 GM/100ML-% IV SOLN
1.0000 g | Freq: Once | INTRAVENOUS | Status: AC
  Administered 2019-06-20: 1 g via INTRAVENOUS
  Filled 2019-06-20: qty 100

## 2019-06-20 NOTE — Therapy (Signed)
Patient refused CPAP.  Patient is on 2 lpm [place] in no distress.

## 2019-06-20 NOTE — Progress Notes (Addendum)
PROGRESS NOTE    Katelyn Lane  UUV:[id number] DOB: February 15, 1955 DOA: 06/19/2019 PCP: [name], MD    Chief Complaint  Patient presents with  . Shortness of Breath    Brief Narrative:   Chief Complaint:  Shortness of  breath  HPI: Katelyn Lane is a 64 y.o. 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  Shortness of breath is also associated with increased lower extremity swelling as well as tightness in her chest.  Patient feels like she is gaining weight and not voiding as much despite taking her diuretics. She denies having any nausea, vomiting, palpitations, diaphoresis, dizziness, lightheadedness, cough, fever or chills Chest x-ray shows cardiomegaly without edema Twelve-lead EKG shows sinus rhythm with frequent PVCs and LVH Labs reveal significantly elevated BNP level of > 4500 as well as worsening renal function from her baseline.  Patient had a serum creatinine of 2 about 2 weeks ago and today on admission it is 2.48. Troponin is elevated as well   ED Course: Patient with multiple medical problems and frequent visits to the emergency room who presents today for evaluation of worsening shortness of breath, bilateral lower extremity swelling and increased weight gain despite taking her diuretics.  She has worsening of her renal function from her baseline and has elevated troponin levels.     Subjective:  Drowsy, drifting to sleep during conversation, denies chest pain  Assessment & Plan:   Principal Problem:   Acute on chronic combined systolic and diastolic CHF (congestive heart failure) (HCC) Active Problems:   Dilated cardiomyopathy (HCC)   Elevated  troponin   CKD (chronic kidney disease), stage III   Acute on chronic combined systolic (congestive) and diastolic (congestive) heart failure (HCC)   History of anemia due to chronic kidney disease  Acute on chronic combined CHF exacerbation -Patient presents for evaluation of worsening shortness of breathand chest painassociated with weight gain and bilateral lower extremity swelling. -Lastknown LVEFisless than 20% aswell is grade 3 diastolic dysfunction -CXR showscardiomegaly with no evidence of pulmonary edema  -Elevated troponin likely demand ischemia in the setting of acute CHF -Restarted on IV Bumex, continued on Coreg Entresto and Aldactone -Daily weights, strict intake and output ,fluid restriction  History of cardiac thrombus continue Xarelto   Qtc prolongation, keep on telemetry, repeat EKG daily DC Zoloft Keep mag above 2, K above 4  AKI on CKD 3b, anemia of chronic disease Possible cardiorenal syndrome Creatinine baseline 2, creatinine on presentation 2.48 Monitor renal function, renal dosing medication  Substance abuse: Cocaine, cessation education provided  Body mass index is 36.96 kg/m.     DVT prophylaxis: Xarelto Code Status: Full Family Communication: Patient, stated she has 1 children a son who is incarcerated for secondary degree murder Disposition:   Status is: Inpatient     Dispo: The patient is from: home              Anticipated d/c is to: home              Anticipated d/c date is: >3days              Patient currently in acute heart failure  Consultants:   Per admitting notes cardiology was consulted  Procedures:  None  Antimicrobials:   None     Objective: Vitals:   06/20/19 0530 06/20/19 0538 06/20/19 0754 06/20/19 1031  BP:  (!) 137/101 (!) 137/107 120/80  Pulse:  78 79 73  Resp:  18 17   Temp:  (!) 97.5 F (36.4 C) 97.7 F (36.5 C)   TempSrc:  Oral    SpO2:  100% 100% 100%  Weight: 107 kg     Height:          Intake/Output Summary (Last 24 hours) at 06/20/2019 1124 Last data filed at 06/20/2019 1015 Gross per 24 hour  Intake 720 ml  Output 0 ml  Net 720 ml   Filed Weights   06/19/19 0456 06/20/19 0530  Weight: 98.4 kg 107 kg    Examination:  General exam: Drowsy, drifting back to sleep during conversation Respiratory system: Clear to auscultation. Respiratory effort normal. Cardiovascular system: S1 & S2 heard, RRR. No JVD, no murmur, No pedal edema. Gastrointestinal system: Abdomen is nondistended, soft and nontender. No organomegaly or masses felt. Normal bowel sounds heard. Central nervous system: Drowsy, but oriented x3. No focal neurological deficits. Extremities: Symmetric 5 x 5 power.  3+ pitting edema bilateral lower extremity Skin: No rashes, lesions or ulcers Psychiatry: Judgement and insight appear normal. Mood & affect appropriate.     Data Reviewed: I have personally reviewed following labs and imaging studies  CBC: Recent Labs  Lab 06/19/19 0509  WBC 4.2  NEUTROABS 1.9  HGB 10.1*  HCT 34.4*  MCV 75.3*  PLT 081    Basic Metabolic Panel: Recent Labs  Lab 06/19/19 0509 06/20/19 0640  NA 140 138  K 3.3* 3.8  CL 102 100  CO2 26 26  GLUCOSE 103* 146*  BUN 36* 39*  CREATININE 2.48* 2.30*  CALCIUM 8.9 8.5*    GFR: Estimated Creatinine Clearance: 31.5 mL/min (A) (by C-G formula based on SCr of 2.3 mg/dL (H)).  Liver Function Tests: Recent Labs  Lab 06/19/19 0509  AST 31  ALT 23  ALKPHOS 211*  BILITOT 1.7*  PROT 6.1*  ALBUMIN 2.9*    CBG: No results for input(s): GLUCAP in the last 168 hours.   Recent Results (from the past 240 hour(s))  SARS Coronavirus 2 by RT PCR (hospital order, performed in [facility] hospital lab) Nasopharyngeal Nasopharyngeal Swab     Status: None   Collection Time: 06/19/19  9:28 AM   Specimen: Nasopharyngeal Swab  Result Value Ref Range Status   SARS Coronavirus 2 NEGATIVE NEGATIVE Final    Comment:  (NOTE) SARS-CoV-2 target nucleic acids are NOT DETECTED.  The SARS-CoV-2 RNA is generally detectable in upper and lower respiratory specimens during the acute phase of infection. The lowest concentration of SARS-CoV-2 viral copies this assay can detect is 250 copies / mL. A negative result does not preclude SARS-CoV-2 infection and should not be used as the sole basis for treatment or other patient management decisions.  A negative result may occur with improper specimen collection / handling, submission of specimen other than nasopharyngeal swab, presence of viral mutation(s) within the areas targeted by this assay, and inadequate number of viral copies (<250 copies / mL). A negative result must be combined with clinical observations, patient history, and epidemiological information.  Fact Sheet for Patients:   StrictlyIdeas.no  Fact Sheet for Healthcare Providers: BankingDealers.co.za  This test is not yet approved or  cleared by the [place] FDA and has been authorized for detection and/or diagnosis of SARS-CoV-2 by  FDA under an Emergency Use Authorization (EUA).  This EUA will remain in effect (meaning this test can be used) for the duration of the COVID-19 declaration under Section 564(b)(1) of the Act, 21 U.S.C. section 360bbb-3(b)(1), unless the authorization is terminated or revoked sooner.  Performed at [facility], [address] [place], [place] [address]          Radiology Studies: DG Chest 2 View  Result Date: 06/19/2019 CLINICAL DATA:  Cough and leg swelling EXAM: CHEST - 2 VIEW COMPARISON:  06/03/2019 FINDINGS: Cardiopericardial enlargement. There is no edema, consolidation, effusion, or pneumothorax. No acute osseous finding IMPRESSION: Cardiomegaly without edema. Electronically Signed   By: Monte Fantasia M.D.   On: 06/19/2019 05:38        Scheduled Meds: . allopurinol  100 mg Oral Daily   . ascorbic acid  500 mg Oral Daily  . aspirin  81 mg Oral Daily  . atorvastatin  20 mg Oral q1800  . bumetanide (BUMEX) IV  1 mg Intravenous Q12H  . carvedilol  3.125 mg Oral BID WC  . pantoprazole  40 mg Oral Daily  . potassium chloride  40 mEq Oral Once  . rivaroxaban  20 mg Oral Q supper  . sacubitril-valsartan  1 tablet Oral BID  . senna-docusate  1 tablet Oral BID   Continuous Infusions: . sodium chloride       LOS: 1 day     Time spent: 73mins I have personally reviewed and interpreted on  06/20/2019 daily labs, tele strips, imagings as discussed above under date review session and assessment and plans.  I reviewed all nursing notes, pharmacy notes, consultant notes,  vitals, pertinent old records  I have discussed plan of care as described above with RN , patient  on 06/20/2019  Voice Recognition /Dragon dictation system was used to create this note, attempts have been made to correct errors. Please contact the author with questions and/or clarifications.   Florencia Reasons, MD PhD FACP [facility]  Available via Epic secure chat 7am-7pm for nonurgent issues Please page for urgent issues To page the attending provider between 7A-7P or the covering provider during after hours 7P-7A, please log into the web site [email or web address] and access using universal [place] password for that web site. If you do not have the password, please call the hospital operator.    06/20/2019, 11:24 AM

## 2019-06-20 NOTE — Progress Notes (Signed)
Patient calling out and yelling at times. Patient has verbalized wanted medication for rest. Informed patient will notify MD about request.

## 2019-06-20 NOTE — Evaluation (Signed)
Clinical/Bedside Swallow Evaluation Patient Details  Name: Katelyn Lane MRN: [id number] Date of Birth: [birth year]  Today's Date: 06/20/2019 Time: SLP Start Time (ACUTE ONLY): 1350 SLP Stop Time (ACUTE ONLY): 1424 SLP Time Calculation (min) (ACUTE ONLY): 34 min  Past Medical History:  Past Medical History:  Diagnosis Date   CHF (congestive heart failure) ([place])    COVID-19    Hyperlipidemia    Hypertension    Renal disorder    Past Surgical History:  Past Surgical History:  Procedure Laterality Date   RIGHT/LEFT HEART CATH AND CORONARY ANGIOGRAPHY N/A 12/30/2018   Procedure: RIGHT/LEFT HEART CATH AND CORONARY ANGIOGRAPHY;  Surgeon: Corey Skains, MD;  Location: [facility];  Service: Cardiovascular;  Laterality: N/A;   HPI:  Per admitting H and P: "Katelyn Lane is a 64 y.o. 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  Shortness of breath is also associated with increased lower extremity swelling as well as tightness in her chest.  Patient feels like she is gaining weight and not voiding as much despite taking her diuretics.   Assessment / Plan / Recommendation Clinical Impression  Bedside swallow eval today was limited as Pt needed continuous cues to stay awake to participate. Once awake, Pt tolerated all consistencies without s/s of aspiration. Oral mech exam revealed structures to be functioning adequately. Vocal quality remained clear, laryngeal elevation appeared adequate. Oral transit [facility] for all consistencies except graham cracker. Alternating thin liquids with solids helped clear and oral residue after the swallow. Rec altering diet to Dys 3 with thin liquids. ST  to follow up with toleration of diet as needed. SLP Visit Diagnosis: Dysphagia, oral phase (R13.11)    Aspiration Risk  Mild aspiration risk    Diet Recommendation Dysphagia 3 (Mech soft)   Liquid Administration via: Straw;Cup Medication Administration: Whole meds with liquid Supervision: Patient able to self feed Compensations: Slow rate;Small sips/bites Postural Changes: Seated upright at 90 degrees;Remain upright for at least 30 minutes after po intake    Other  Recommendations     Follow up Recommendations    F/u with toleration of diet    Frequency and Duration     once       Prognosis   good     Swallow Study   General Date of Onset: 06/19/19 HPI: Per admitting H and P: "Katelyn Lane is a 64 y.o. 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  Shortness of breath is also associated with increased lower extremity swelling as well as tightness in her chest.  Patient feels like she is gaining weight and not voiding as much despite taking her diuretics. Type of Study: Bedside Swallow Evaluation Diet Prior to this Study: Regular Temperature Spikes Noted: No Respiratory Status: Room air History of Recent Intubation: No Behavior/Cognition: Cooperative;Lethargic/Drowsy Oral Cavity Assessment: Within Functional Limits Oral Care Completed by SLP: No Oral Cavity - Dentition: Adequate natural dentition Vision: Functional for self-feeding Self-Feeding Abilities: Able to feed self  Patient Positioning: Upright in bed Baseline Vocal Quality: Normal    Oral/Motor/Sensory Function Overall Oral Motor/Sensory Function: Within functional limits   Ice Chips Ice chips: Within functional  limits Presentation: Spoon   Thin Liquid Thin Liquid: Within functional limits Presentation: Straw    Nectar Thick Nectar Thick Liquid: Not tested   Honey Thick Honey Thick Liquid: Not tested   Puree Puree: Within functional limits   Solid     Solid: Within functional limits      Katelyn Lane 06/20/2019,2:25 PM

## 2019-06-20 NOTE — Progress Notes (Signed)
Duoneb SVN changed to prn per pts request. She states that she does not take breathing tx's at home and doesn't need them now.

## 2019-06-20 NOTE — Progress Notes (Signed)
Patient is appears to be sleeping at this time. Will continue to monitor patient's behavior.

## 2019-06-21 DIAGNOSIS — I5023 Acute on chronic systolic (congestive) heart failure: Secondary | ICD-10-CM

## 2019-06-21 LAB — BASIC METABOLIC PANEL
Anion gap: 10 (ref 5–15)
BUN: 39 mg/dL — ABNORMAL HIGH (ref 8–23)
CO2: 25 mmol/L (ref 22–32)
Calcium: 8.4 mg/dL — ABNORMAL LOW (ref 8.9–10.3)
Chloride: 104 mmol/L (ref 98–111)
Creatinine, Ser: 2.12 mg/dL — ABNORMAL HIGH (ref 0.44–1.00)
GFR calc Af Amer: 28 mL/min — ABNORMAL LOW (ref >60)
GFR calc non Af Amer: 24 mL/min — ABNORMAL LOW (ref >60)
Glucose, Bld: 109 mg/dL — ABNORMAL HIGH (ref 70–99)
Potassium: 3.8 mmol/L (ref 3.5–5.1)
Sodium: 139 mmol/L (ref 135–145)

## 2019-06-21 LAB — MAGNESIUM: Magnesium: 2.2 mg/dL (ref 1.7–2.4)

## 2019-06-21 MED ORDER — DAPAGLIFLOZIN PROPANEDIOL 10 MG PO TABS: 10.0000 mg | ORAL_TABLET | Freq: Every day | ORAL | Status: DC

## 2019-06-21 MED ORDER — SPIRONOLACTONE 25 MG PO TABS
12.5000 mg | ORAL_TABLET | Freq: Every day | ORAL | Status: DC
  Administered 2019-06-21 – 2019-06-23 (×3): 12.5 mg via ORAL
  Filled 2019-06-21 (×2): qty 1
  Filled 2019-06-21 (×3): qty 0.5
  Filled 2019-06-21: qty 1

## 2019-06-21 MED ORDER — SPIRONOLACTONE 25 MG PO TABS: 25.0000 mg | ORAL_TABLET | Freq: Every day | ORAL | Status: DC

## 2019-06-21 MED ORDER — MELATONIN 5 MG PO TABS
5.0000 mg | ORAL_TABLET | Freq: Every day | ORAL | Status: DC
  Administered 2019-06-22 – 2019-06-23 (×3): 5 mg via ORAL
  Filled 2019-06-21 (×3): qty 1

## 2019-06-21 MED ORDER — CARVEDILOL 6.25 MG PO TABS
6.2500 mg | ORAL_TABLET | Freq: Two times a day (BID) | ORAL | Status: DC
  Administered 2019-06-21 – 2019-06-24 (×6): 6.25 mg via ORAL
  Filled 2019-06-21 (×6): qty 1

## 2019-06-21 MED ORDER — ALUM & MAG HYDROXIDE-SIMETH 200-200-20 MG/5ML PO SUSP
30.0000 mL | Freq: Four times a day (QID) | ORAL | Status: DC | PRN
  Administered 2019-06-21 – 2019-06-24 (×4): 30 mL via ORAL
  Filled 2019-06-21 (×4): qty 30

## 2019-06-21 NOTE — Progress Notes (Addendum)
PROGRESS NOTE    Katelyn Lane  UEA:[id number] DOB: Feb 23, 1955 DOA: 06/19/2019 PCP: [name], MD    Chief Complaint  Patient presents with  . Shortness of Breath    Brief Narrative:   Chief Complaint:  Shortness of  breath  HPI: Katelyn Lane is a 64 y.o. 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  Shortness of breath is also associated with increased lower extremity swelling as well as tightness in her chest.  Patient feels like she is gaining weight and not voiding as much despite taking her diuretics. She denies having any nausea, vomiting, palpitations, diaphoresis, dizziness, lightheadedness, cough, fever or chills Chest x-ray shows cardiomegaly without edema Twelve-lead EKG shows sinus rhythm with frequent PVCs and LVH Labs reveal significantly elevated BNP level of > 4500 as well as worsening renal function from her baseline.  Patient had a serum creatinine of 2 about 2 weeks ago and today on admission it is 2.48. Troponin is elevated as well   ED Course: Patient with multiple medical problems and frequent visits to the emergency room who presents today for evaluation of worsening shortness of breath, bilateral lower extremity swelling and increased weight gain despite taking her diuretics.  She has worsening of her renal function from her baseline and has elevated troponin levels.     Subjective: Refused CPAP last night, she is on 2 L oxygen 1 flow monitor urine output documented last 24 hours She is fully alert this morning, denies chest pain, complaining of  pain on top of both feet and ankles, legs and feet remain significantly swollen  Assessment &  Plan:   Principal Problem:   Acute on chronic combined systolic and diastolic CHF (congestive heart failure) (HCC) Active Problems:   Dilated cardiomyopathy (HCC)   Elevated troponin   CKD (chronic kidney disease), stage III   Acute on chronic combined systolic (congestive) and diastolic (congestive) heart failure (HCC)   History of anemia due to chronic kidney disease  Acute on chronic combined CHF exacerbation -Patient presents for evaluation of worsening shortness of breathand chest painassociated with weight gain and bilateral lower extremity swelling. -Lastknown LVEFisless than 20% aswell is grade 3 diastolic dysfunction -CXR showscardiomegaly with no evidence of pulmonary edema  -Elevated troponin likely demand ischemia in the setting of acute CHF -Restarted on IV Bumex, continued on Coreg Entresto and Aldactone -Daily weights, strict intake and output ,fluid restriction  History of cardiac thrombus continue Xarelto   Qtc prolongation,  QTC 530 on presentation DC Zoloft, Keep mag above 2, K above 4   repeat EKG QTC today is 492,  continue telemetry and daily EKG for QTC monitoring    AKI on CKD 3b, anemia of chronic disease Possible cardiorenal syndrome Creatinine baseline 2, creatinine on presentation 2.48-2.3-2.1 Monitor renal function, renal dosing medication  Substance abuse: Cocaine, cessation education provided  Body mass index is 37.28 kg/m.     DVT prophylaxis: Xarelto Code Status: Full Family Communication: Patient, stated she has 1 children a son who is incarcerated for secondary degree murder Disposition:   Status is: Inpatient    Dispo: The patient is from: home              Anticipated d/c is  to: home              Anticipated d/c date is: 24-48hrs             Patient currently in acute heart failure, remain significantly volume overloaded  Consultants:   Per admitting notes cardiology was consulted, I did not see a cardiology note,  will reconsult cardiology Addendum: Dr Humphrey Rolls will see patient in consult.  Procedures:   None  Antimicrobials:   None     Objective: Vitals:   06/20/19 1533 06/20/19 1920 06/21/19 0544 06/21/19 0736  BP: (!) 134/101 (!) 109/91  130/76  Pulse: 79 66  74  Resp: 20 20  17   Temp: 97.7 F (36.5 C) 97.6 F (36.4 C)  98 F (36.7 C)  TempSrc: Oral Oral    SpO2: 100% 100%  100%  Weight:   108 kg   Height:        Intake/Output Summary (Last 24 hours) at 06/21/2019 0903 Last data filed at 06/21/2019 0736 Gross per 24 hour  Intake 1000 ml  Output 1101 ml  Net -101 ml   Filed Weights   06/19/19 0456 06/20/19 0530 06/21/19 0544  Weight: 98.4 kg 107 kg 108 kg    Examination:  General exam: aaox3 Respiratory system: Clear to auscultation. No wheezing, no rales ,Respiratory effort normal. Cardiovascular system: S1 & S2 heard, RRR. No JVD, no murmur, No pedal edema. Gastrointestinal system: Abdomen is nondistended, soft and nontender. No organomegaly or masses felt. Normal bowel sounds heard. Central nervous system: Drowsy, but oriented x3. No focal neurological deficits. Extremities: Symmetric 5 x 5 power.  3+ pitting edema bilateral lower extremity Skin: No rashes, lesions or ulcers Psychiatry: Judgement and insight appear normal. Mood & affect appropriate.     Data Reviewed: I have personally reviewed following labs and imaging studies  CBC: Recent Labs  Lab 06/19/19 0509  WBC 4.2  NEUTROABS 1.9  HGB 10.1*  HCT 34.4*  MCV 75.3*  PLT 950    Basic Metabolic Panel: Recent Labs  Lab 06/19/19 0509 06/20/19 0640 06/21/19 0601  NA 140 138 139  K 3.3* 3.8 3.8  CL 102 100 104  CO2 26 26 25   GLUCOSE 103* 146* 109*  BUN 36* 39* 39*  CREATININE 2.48* 2.30* 2.12*  CALCIUM 8.9 8.5* 8.4*  MG  --   --  2.2    GFR: Estimated Creatinine Clearance: 34.4 mL/min (A) (by C-G formula based on SCr of 2.12 mg/dL (H)).  Liver Function Tests: Recent Labs  Lab  06/19/19 0509  AST 31  ALT 23  ALKPHOS 211*  BILITOT 1.7*  PROT 6.1*  ALBUMIN 2.9*    CBG: No results for input(s): GLUCAP in the last 168 hours.   Recent Results (from the past 240 hour(s))  SARS Coronavirus 2 by RT PCR (hospital order, performed in [facility] hospital lab) Nasopharyngeal Nasopharyngeal Swab     Status: None   Collection Time: 06/19/19  9:28 AM   Specimen: Nasopharyngeal Swab  Result Value Ref Range Status   SARS Coronavirus 2 NEGATIVE NEGATIVE Final    Comment: (NOTE) SARS-CoV-2 target nucleic acids are NOT DETECTED.  The SARS-CoV-2 RNA is generally detectable in upper and lower respiratory specimens during the acute phase of infection. The lowest concentration of SARS-CoV-2 viral copies this assay can detect is 250 copies / mL. A negative result does not preclude SARS-CoV-2 infection and should not be used as the sole basis for treatment or other patient management  decisions.  A negative result may occur with improper specimen collection / handling, submission of specimen other than nasopharyngeal swab, presence of viral mutation(s) within the areas targeted by this assay, and inadequate number of viral copies (<250 copies / mL). A negative result must be combined with clinical observations, patient history, and epidemiological information.  Fact Sheet for Patients:   StrictlyIdeas.no  Fact Sheet for Healthcare Providers: BankingDealers.co.za  This test is not yet approved or  cleared by the [place] FDA and has been authorized for detection and/or diagnosis of SARS-CoV-2 by FDA under an Emergency Use Authorization (EUA).  This EUA will remain in effect (meaning this test can be used) for the duration of the COVID-19 declaration under Section 564(b)(1) of the Act, 21 U.S.C. section 360bbb-3(b)(1), unless the authorization is terminated or revoked sooner.  Performed at [facility], [address], [place], [place] [address]          Radiology Studies: No results found.      Scheduled Meds: . allopurinol  100 mg Oral Daily  . ascorbic acid  500 mg Oral Daily  . aspirin  81 mg Oral Daily  . atorvastatin  20 mg Oral q1800  . bumetanide (BUMEX) IV  1 mg Intravenous Q12H  . carvedilol  3.125 mg Oral BID WC  . pantoprazole  40 mg Oral Daily  . rivaroxaban  20 mg Oral Q supper  . sacubitril-valsartan  1 tablet Oral BID  . senna-docusate  1 tablet Oral BID   Continuous Infusions: . sodium chloride Stopped (06/20/19 1931)     LOS: 2 days     Time spent: 82mins I have personally reviewed and interpreted on  06/21/2019 daily labs, tele strips, imagings as discussed above under date review session and assessment and plans.  I reviewed all nursing notes, pharmacy notes, vitals, pertinent old records  I have discussed plan of care as described above with RN , patient  on 06/21/2019  Voice Recognition /Dragon dictation system was used to create this note, attempts have been made to correct errors. Please contact the author with questions and/or clarifications.   Florencia Reasons, MD PhD FACP [facility]  Available via Epic secure chat 7am-7pm for nonurgent issues Please page for urgent issues To page the attending provider between 7A-7P or the covering provider during after hours 7P-7A, please log into the web site [email or web address] and access using universal [place] password for that web site. If you do not have the password, please call the hospital operator.    06/21/2019, 9:03 AM

## 2019-06-21 NOTE — Consult Note (Addendum)
Bedside RN called stating Katelyn Lane is not on hospital formulary. Patient instead started on 12.5mg  spironolactone.   Katelyn Lane is a 64 y.o. female  [id number]  Primary Cardiologist: Neoma Laming Reason for Consultation: Combined HFrEF / HFpEF  HPI: Patient is a 64 year old female of chronic combined HFpEF and HFrEF, CKD III, hypertension, cardiac thrombus, and substance abuse.  Patient presented to the emergency department with increased bilateral lower extremity edema and shortness of breath and associated chest tightness.  Patient's last echocardiogram on 04/29/2019 revealed LVEF less than 20% with moderate LVH/MR, severely elevated PA pressure, and severe TR. Patient follows with [facility] heart failure clinic and does not have a primary cardiologist.  We have been consulted for further HFrEF/HFpEF management.   Review of Systems: ROS was difficult as patient is quite somnolent.  Patient alluded to her shortness of breath to be improving and denies chest pain.   Past Medical History:  Diagnosis Date  . CHF (congestive heart failure) ([place])   . COVID-19   . Hyperlipidemia   . Hypertension   . Renal disorder     Medications Prior to Admission  Medication Sig Dispense Refill  . albuterol (VENTOLIN HFA) 108 (90 Base) MCG/ACT inhaler Inhale 2 puffs into the lungs every 6 (six) hours as needed for wheezing or shortness of breath.    . allopurinol (ZYLOPRIM) 100 MG tablet Take 1 tablet (100 mg total) by mouth daily. 30 tablet 0  . ascorbic acid (VITAMIN C) 500 MG tablet Take 500 mg by mouth daily.    Marland Kitchen aspirin 81 MG chewable tablet Chew 1 tablet (81 mg total) by mouth daily. 30 tablet 0  . atorvastatin (LIPITOR) 20 MG tablet Take 1 tablet (20 mg total) by mouth daily at 6 PM. 30 tablet 2  . carvedilol (COREG) 3.125 MG tablet Take 1 tablet (3.125 mg total) by mouth 2 (two) times daily with a meal. 60 tablet 2  . docusate calcium (SURFAK) 240 MG capsule Take 240 mg by mouth daily as needed for  moderate constipation.     Marland Kitchen FLUoxetine (PROZAC) 20 MG capsule Take 1 capsule (20 mg total) by mouth daily. 60 capsule 3  . furosemide (LASIX) 40 MG tablet Take 2 tablets (80 mg total) by mouth daily. 60 tablet 2  . Ipratropium-Albuterol (COMBIVENT) 20-100 MCG/ACT AERS respimat Inhale 1 puff into the lungs every 6 (six) hours. 4 g 0  . loratadine (CLARITIN) 10 MG tablet Take 10 mg by mouth daily as needed for allergies.     . nitroGLYCERIN (NITROSTAT) 0.4 MG SL tablet Place 1 tablet (0.4 mg total) under the tongue every 5 (five) minutes as needed for chest pain. 30 tablet 1  . pantoprazole (PROTONIX) 40 MG tablet Take 1 tablet (40 mg total) by mouth daily. 30 tablet 0  . rivaroxaban (XARELTO) 20 MG TABS tablet Take 1 tablet (20 mg total) by mouth daily with supper. For the blood clot in your heart. 30 tablet 2  . sacubitril-valsartan (ENTRESTO) 49-51 MG Take 1 tablet by mouth 2 (two) times daily. 60 tablet 0     . allopurinol  100 mg Oral Daily  . ascorbic acid  500 mg Oral Daily  . aspirin  81 mg Oral Daily  . atorvastatin  20 mg Oral q1800  . bumetanide (BUMEX) IV  1 mg Intravenous Q12H  . carvedilol  6.25 mg Oral BID WC  . pantoprazole  40 mg Oral Daily  . rivaroxaban  20 mg Oral Q supper  .  sacubitril-valsartan  1 tablet Oral BID  . senna-docusate  1 tablet Oral BID  . spironolactone  25 mg Oral Daily    Infusions: . sodium chloride Stopped (06/20/19 1931)    Allergies  Allergen Reactions  . Penicillins Hives, Rash and Other (See Comments)    Did it involve swelling of the face/tongue/throat, SOB, or low BP? No Did it involve sudden or severe rash/hives, skin peeling, or any reaction on the inside of your mouth or nose? Yes Did you need to seek medical attention at a hospital or doctor's office? Yes When did it last happen? >25 years If all above answers are "NO", may proceed with cephalosporin use.   . Ace Inhibitors Nausea And Vomiting    Social History    Socioeconomic History  . Marital status: Single    Spouse name: Not on file  . Number of children: Not on file  . Years of education: Not on file  . Highest education level: Not on file  Occupational History  . Not on file  Tobacco Use  . Smoking status: Current Every Day Smoker  . Smokeless tobacco: Never Used  Substance and Sexual Activity  . Alcohol use: No    Alcohol/week: 0.0 standard drinks  . Drug use: Yes    Types: Cocaine    Comment: 4-5 days ago  . Sexual activity: Not on file  Other Topics Concern  . Not on file  Social History Narrative  . Not on file   Social Determinants of Health   Financial Resource Strain:   . Difficulty of Paying Living Expenses:   Food Insecurity:   . Worried About Charity fundraiser in the Last Year:   . Arboriculturist in the Last Year:   Transportation Needs:   . Film/video editor (Medical):   Marland Kitchen Lack of Transportation (Non-Medical):   Physical Activity:   . Days of Exercise per Week:   . Minutes of Exercise per Session:   Stress:   . Feeling of Stress :   Social Connections:   . Frequency of Communication with Friends and Family:   . Frequency of Social Gatherings with Friends and Family:   . Attends Religious Services:   . Active Member of Clubs or Organizations:   . Attends Archivist Meetings:   Marland Kitchen Marital Status:   Intimate Partner Violence:   . Fear of Current or Ex-Partner:   . Emotionally Abused:   Marland Kitchen Physically Abused:   . Sexually Abused:     Family History  Problem Relation Age of Onset  . Breast cancer Neg Hx     PHYSICAL EXAM: Vitals:   06/21/19 1121 06/21/19 1451  BP: (!) 118/98 125/86  Pulse: 91 (!) 103  Resp: 17 18  Temp: 97.8 F (36.6 C) 97.7 F (36.5 C)  SpO2: 100% 100%     Intake/Output Summary (Last 24 hours) at 06/21/2019 1541 Last data filed at 06/21/2019 1500 Gross per 24 hour  Intake 841.15 ml  Output 601 ml  Net 240.15 ml    General:  Well appearing. No  respiratory difficulty HEENT: normal Neck: supple. no JVD. Carotids 2+ bilat; no bruits. No lymphadenopathy or thryomegaly appreciated. Cor: PMI nondisplaced. Regular rate & rhythm. No rubs, gallops or murmurs. Lungs: Expiratory wheezes Abdomen: soft, nontender, nondistended. No hepatosplenomegaly. No bruits or masses. Good bowel sounds. Extremities: no cyanosis, clubbing, rash. + 2 BLE Edema and tender Neuro: alert & oriented x 3, Somnolent. cranial nerves grossly  intact. moves all 4 extremities w/o difficulty.  ECG: Normal sinus rhythm with evidence of previous inferior infarct and T wave abnormalities in the lateral leads.  72/BPM  Results for orders placed or performed during the hospital encounter of 06/19/19 (from the past 24 hour(s))  Basic metabolic panel     Status: Abnormal   Collection Time: 06/21/19  6:01 AM  Result Value Ref Range   Sodium 139 135 - 145 mmol/L   Potassium 3.8 3.5 - 5.1 mmol/L   Chloride 104 98 - 111 mmol/L   CO2 25 22 - 32 mmol/L   Glucose, Bld 109 (H) 70 - 99 mg/dL   BUN 39 (H) 8 - 23 mg/dL   Creatinine, Ser 2.12 (H) 0.44 - 1.00 mg/dL   Calcium 8.4 (L) 8.9 - 10.3 mg/dL   GFR calc non Af Amer 24 (L) >60 mL/min   GFR calc Af Amer 28 (L) >60 mL/min   Anion gap 10 5 - 15  Magnesium     Status: None   Collection Time: 06/21/19  6:01 AM  Result Value Ref Range   Magnesium 2.2 1.7 - 2.4 mg/dL   No results found.   ASSESSMENT AND PLAN: Patient presented to the emergency department with shortness of breath and worsening bilateral extremity edema.  Patient did initially have a mild increase in troponin but this was most likely in the setting of demand ischemia due to her CKD. patient with LVEF <20% and grade III diastolic dysfunction.  Will continue to optimize with carvedilol and Entresto.  Patient with worsening renal failure and therefore we will continue to hold Aldactone but will begin Farxiga 10 mg daily.  Patient's heart rate in the 80s and therefore will  increase carvedilol at this time to 6.25 mg twice daily.  In the setting of an acute exacerbation, we will repeat echocardiogram.  Please continue to diurese the patient with Bumex 1 mg twice daily and please continue Xarelto with her history of cardiac thrombus.  Hypertension well-controlled at this time.  Patient originally had a prolonged QTC as well which is now resolving, please continue daily EKGs for the next 2 to 3 days and avoid QTC prolonging medications.  Patient with a history of substance abuse, primarily cocaine, and patient educated on the effects that this drug can have on her heart.  Please reiterate the importance of the patient wears her CPAP at night. My concern for the patient is her compliance with her medications as looking back at previous notes by the [facility] heart failure clinic patient will soon become homeless.  We will continue to follow.  Adaline Sill, NP-C

## 2019-06-22 ENCOUNTER — Inpatient Hospital Stay: Admit: 2019-06-22 | Payer: Medicaid Other

## 2019-06-22 ENCOUNTER — Inpatient Hospital Stay
Admit: 2019-06-22 | Discharge: 2019-06-22 | Disposition: A | Discharge status: Home / Self Care | Payer: Medicaid Other | Attending: Nurse Practitioner | Admitting: Nurse Practitioner

## 2019-06-22 DIAGNOSIS — I5023 Acute on chronic systolic (congestive) heart failure: Secondary | ICD-10-CM

## 2019-06-22 DIAGNOSIS — R778 Other specified abnormalities of plasma proteins: Secondary | ICD-10-CM

## 2019-06-22 DIAGNOSIS — G4733 Obstructive sleep apnea (adult) (pediatric): Secondary | ICD-10-CM

## 2019-06-22 LAB — BASIC METABOLIC PANEL
Anion gap: 8 (ref 5–15)
BUN: 38 mg/dL — ABNORMAL HIGH (ref 8–23)
CO2: 27 mmol/L (ref 22–32)
Calcium: 8.4 mg/dL — ABNORMAL LOW (ref 8.9–10.3)
Chloride: 104 mmol/L (ref 98–111)
Creatinine, Ser: 2.01 mg/dL — ABNORMAL HIGH (ref 0.44–1.00)
GFR calc Af Amer: 30 mL/min — ABNORMAL LOW (ref >60)
GFR calc non Af Amer: 26 mL/min — ABNORMAL LOW (ref >60)
Glucose, Bld: 134 mg/dL — ABNORMAL HIGH (ref 70–99)
Potassium: 3.6 mmol/L (ref 3.5–5.1)
Sodium: 139 mmol/L (ref 135–145)

## 2019-06-22 LAB — ECHOCARDIOGRAM COMPLETE
Height: 67 in
Weight: 3803.2 oz

## 2019-06-22 MED ORDER — LORAZEPAM 2 MG/ML IJ SOLN
1.0000 mg | Freq: Once | INTRAMUSCULAR | Status: AC
  Administered 2019-06-22: 1 mg via INTRAVENOUS

## 2019-06-22 MED ORDER — LORAZEPAM 2 MG/ML IJ SOLN
INTRAMUSCULAR | Status: AC
  Administered 2019-06-22: 2 mg
  Filled 2019-06-22: qty 1

## 2019-06-22 NOTE — Evaluation (Signed)
Physical Therapy Evaluation Patient Details Name: Katelyn Lane MRN: [id number] DOB: [birth year] Today's Date: 06/22/2019   History of Present Illness  The patient is a 64 y/o female with systolic and diastolic dysfunction CHF. Her chief complaint this admission was SOB at rest with increased BLE swelling and chest tightness. Her PMH includes: CKD stage III, HTN, COPD, substance abuse, cardiac thrombus (on Xarelto), and depression.  Clinical Impression  Pt in bathroom upon clinician's arrival to room and agreeable to participate in PT this morning. Pt presents with swelling in BLE and decreased sensation in toes. Pt reported several falls in the recent weeks. Pt is very verbose and tends to perseverate on several things, including BLE swelling. Pt ambulated around nurses station on floor using RW with supervision/CGA for safety. Pt ambulates with reciprocal gait pattern and slightly decreased gait speed. Pt educated on use of RW for safety with ambulation. At this point, pt is a fall risk as denoted by decreased safety awareness and decreased steadiness on feet. Checked vitals post ambulation: SpO2 98, HR 84 on room air. Pt would benefit from continued skilled therapy while admitted. Recommend outpatient PT to address aforementioned deficits.    Follow Up Recommendations Outpatient PT    Equipment Recommendations       Recommendations for Other Services       Precautions / Restrictions Precautions Precautions: Fall Precaution Comments: Pt has history of falls. Restrictions Weight Bearing Restrictions: No      Mobility  Bed Mobility               General bed mobility comments: Bed mobility not assessed as pt up in bathroom upon clinician's arrival to room.  Transfers Overall transfer level: Modified independent (Use of nearby objects for steadying assist.) Equipment used: Rolling walker (2 wheeled)             General transfer comment: Pt with decreased eccentric control  on descent to chair.  Ambulation/Gait Ambulation/Gait assistance: Supervision;Min guard Gait Distance (Feet): 260 Feet Assistive device: Rolling walker (2 wheeled) Gait Pattern/deviations: Step-through pattern     General Gait Details: Pt with reciprocal gait pattern with slightly decreased but steady gait speed. Supervision for safety.  Stairs            Wheelchair Mobility    Modified Rankin (Stroke Patients Only)       Balance Overall balance assessment: Needs assistance;History of Falls Sitting-balance support: Feet supported Sitting balance-Leahy Scale: Good     Standing balance support: Bilateral upper extremity supported Standing balance-Leahy Scale: Good                               Pertinent Vitals/Pain Pain Assessment: 0-10 Pain Score: 4  Pain Location: BLE Pain Descriptors / Indicators: Discomfort Pain Intervention(s): Repositioned    Home Living Family/patient expects to be discharged to:: Unsure Living Arrangements: Alone               Additional Comments: Pt reports that she has been evicted from the place at which she was staying before. She has reported attempting to sleep in her car but states that it is very difficult to get restful sleep.    Prior Function Level of Independence: Independent         Comments: Pt reports having several falls in recent weeks; specifically one in the bathroom and one outside. Pt also reports having a RW but states that she does her best  to never use it, however it is in her car if needed.     Hand Dominance   Dominant Hand: Left    Extremity/Trunk Assessment   Upper Extremity Assessment Upper Extremity Assessment: Overall WFL for tasks assessed    Lower Extremity Assessment Lower Extremity Assessment: Overall WFL for tasks assessed       Communication   Communication: No difficulties  Cognition Arousal/Alertness: Awake/alert Behavior During Therapy: WFL for tasks  assessed/performed Overall Cognitive Status: Within Functional Limits for tasks assessed                                        General Comments      Exercises Other Exercises Other Exercises: Pt ambulated to the bathroom alone while holding onto objects/furniture in the room for steadying.    Assessment/Plan    PT Assessment Patient needs continued PT services  PT Problem List Decreased balance;Decreased safety awareness;Impaired sensation;Pain       PT Treatment Interventions Functional mobility training;Therapeutic activities;Therapeutic exercise;Balance training;Patient/family education;Gait training    PT Goals (Current goals can be found in the Care Plan section)  Acute Rehab PT Goals Patient Stated Goal: To get the swelling down in her legs, to have better balance and not fall PT Goal Formulation: With patient Time For Goal Achievement: 07/06/19 Potential to Achieve Goals: Good    Frequency Min 2X/week   Barriers to discharge   Pt may be homeless at this time.    Co-evaluation               AM-PAC PT "6 Clicks" Mobility  Outcome Measure Help needed turning from your back to your side while in a flat bed without using bedrails?: A Little Help needed moving from lying on your back to sitting on the side of a flat bed without using bedrails?: A Little Help needed moving to and from a bed to a chair (including a wheelchair)?: A Little Help needed standing up from a chair using your arms (e.g., wheelchair or bedside chair)?: A Little Help needed to walk in hospital room?: A Little Help needed climbing 3-5 steps with a railing? : A Lot 6 Click Score: 17    End of Session   Activity Tolerance: Patient limited by pain;Patient limited by fatigue Patient left: in chair;with call bell/phone within reach;with chair alarm set Nurse Communication: Mobility status;Other (comment) (Pt requested to have toenails cut by podiatrist.) PT Visit Diagnosis:  Unsteadiness on feet (R26.81);Repeated falls (R29.6);Pain Pain - Right/Left:  (Both) Pain - part of body: Leg    Time: 8786-7672 PT Time Calculation (min) (ACUTE ONLY): 32 min   Charges:   PT Evaluation $PT Eval Low Complexity: 1 Low PT Treatments $Gait Training: 8-22 mins        Katelyn Lane, SPT  Katelyn Lane 06/22/2019, 12:58 PM

## 2019-06-22 NOTE — Progress Notes (Signed)
Patient stated around midnight that she did not want the stool softener prescribe; reports that she had "4 stools yesterday, and it made her stomach hurt". She also complains that her feet are bothering her due to swelling; LE edema is present to mid calf, 1+ pitting, and tender to touch.Unable to state a level of pain.   Around 3:30 am pt reports that she had a "stomach ache" and wanted something for it; pt given Mylanta. Pt also states that she wants to go to [facility] because she is no better. Informed that she can discuss with MD today.

## 2019-06-22 NOTE — Progress Notes (Signed)
[place] received OR for advanced directive education and prayer for pt. When [place] arrived, pt. grumbling to herself in rm. about something that seemed related to the dinner on her bed tray, pacing back and forth in rm.  Pt. invited [place] to sit and but then excused herself to use the bathroom; from bathroom, pt. called for [facility] to get NT to help her.  [place] contacted NT; when [facility] returned after eating dinner, pt.'s door closed and no response to knocks.  [place] will attempt follow-up in AM if possible and/or make referral to AM chaplains.    06/21/19 1830  Clinical Encounter Type  Visited With Patient  Visit Type Initial;Psychological support;Social support (Advanced care planning)  Referral From Nurse

## 2019-06-22 NOTE — Progress Notes (Signed)
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 Troponin is elevated as well.EKG shows sinus rhythm with frequent PVCs and LVH. Hospital course: Patient admitted to [facility] for acute CHF management with cardiology consultation.   Subjective: Patient resting in bed, noted to be on 2 L O2.  Denies using home O2.  Asking if her nails can be clipped by podiatry?  Dr. Caryl Comes.  Reports feeling tired and dyspneic after walking with PT.  Objective: Vitals:   06/21/19 1936 06/22/19 0447 06/22/19 0448 06/22/19 0743  BP: (!) 125/92 (!) 114/100  119/86  Pulse: 73 73  72  Resp: 20 18  18   Temp: 97.6 F (36.4 C)  97.7 F (36.5 C) 98.7 F (37.1 C)  TempSrc: Oral     SpO2: 100% 100%  100%  Weight:  107.8 kg    Height:        Intake/Output Summary (Last 24 hours) at 06/22/2019 0758 Last data filed at 06/22/2019 0300 Gross per 24 hour  Intake 281.15 ml  Output 200 ml  Net 81.15 ml   Filed Weights   06/20/19 0530 06/21/19 0544 06/22/19 0447  Weight: 107 kg 108 kg 107.8 kg    Physical Examination:  General exam: Appears calm and comfortable  Respiratory system: Clear to auscultation. Respiratory effort okay at rest Cardiovascular system: S1 & S2 heard, RRR. No JVD, murmurs.  Trace to 1+ pedal edema. Gastrointestinal system: Abdomen is nondistended, soft and nontender. Normal bowel sounds heard. Central nervous system: Alert and oriented. No new focal neurological deficits. Extremities: No contractures, 1+ pitting leg edema  Skin: No  rashes, lesions or ulcers Psychiatry: Judgement and insight appear normal. Mood & affect appropriate.   Data Reviewed: I have personally reviewed following labs and imaging studies  CBC: Recent Labs  Lab 06/19/19 0509  WBC 4.2  NEUTROABS 1.9  HGB 10.1*  HCT 34.4*  MCV 75.3*  PLT 629   Basic Metabolic Panel: Recent Labs  Lab 06/19/19 0509 06/20/19 0640 06/21/19 0601 06/22/19 0558  NA 140 138 139 139  K 3.3* 3.8 3.8 3.6  CL 102 100 104 104  CO2 26 26 25 27   GLUCOSE 103* 146* 109* 134*  BUN 36* 39* 39* 38*  CREATININE 2.48* 2.30* 2.12* 2.01*  CALCIUM 8.9 8.5* 8.4* 8.4*  MG  --   --  2.2  --    GFR: Estimated Creatinine Clearance: 36.2 mL/min (A) (by C-G formula based on SCr of 2.01 mg/dL (H)). Liver Function Tests: Recent Labs  Lab 06/19/19 0509  AST 31  ALT 23  ALKPHOS 211*  BILITOT 1.7*  PROT 6.1*  ALBUMIN 2.9*   No results for input(s): LIPASE, AMYLASE in the last 168 hours. No results for input(s): AMMONIA in the last 168 hours. Coagulation Profile: No results for input(s): INR, PROTIME in the last 168 hours. Cardiac Enzymes: No results for input(s): CKTOTAL, CKMB, CKMBINDEX, TROPONINI in the last 168 hours. BNP (last 3 results) No results for input(s): PROBNP in the last 8760 hours. HbA1C: No results for input(s): HGBA1C in the last 72 hours.  CBG: No results for input(s): GLUCAP in the last 168 hours. Lipid Profile: No results for input(s): CHOL, HDL, LDLCALC, TRIG, CHOLHDL, LDLDIRECT in the last 72 hours. Thyroid Function Tests: No results for input(s): TSH, T4TOTAL, FREET4, T3FREE, THYROIDAB in the last 72 hours. Anemia Panel: No results for input(s): VITAMINB12, FOLATE, FERRITIN, TIBC, IRON, RETICCTPCT in the last 72 hours. Sepsis Labs: No results for input(s): PROCALCITON, LATICACIDVEN in the last 168 hours.  Recent Results (from the past 240 hour(s))  SARS Coronavirus 2 by RT PCR (hospital order, performed in [facility] hospital lab)  Nasopharyngeal Nasopharyngeal Swab     Status: None   Collection Time: 06/19/19  9:28 AM   Specimen: Nasopharyngeal Swab  Result Value Ref Range Status   SARS Coronavirus 2 NEGATIVE NEGATIVE Final    Comment: (NOTE) SARS-CoV-2 target nucleic acids are NOT DETECTED.  The SARS-CoV-2 RNA is generally detectable in upper and lower respiratory specimens during the acute phase of infection. The lowest concentration of SARS-CoV-2 viral copies this assay can detect is 250 copies / mL. A negative result does not preclude SARS-CoV-2 infection and should not be used as the sole basis for treatment or other patient management decisions.  A negative result may occur with improper specimen collection / handling, submission of specimen other than nasopharyngeal swab, presence of viral mutation(s) within the areas targeted by this assay, and inadequate number of viral copies (<250 copies / mL). A negative result must be combined with clinical observations, patient history, and epidemiological information.  Fact Sheet for Patients:   StrictlyIdeas.no  Fact Sheet for Healthcare Providers: BankingDealers.co.za  This test is not yet approved or  cleared by the [place] FDA and has been authorized for detection and/or diagnosis of SARS-CoV-2 by FDA under an Emergency Use Authorization (EUA).  This EUA will remain in effect (meaning this test can be used) for the duration of the COVID-19 declaration under Section 564(b)(1) of the Act, 21 U.S.C. section 360bbb-3(b)(1), unless the authorization is terminated or revoked sooner.  Performed at [facility], [address] [place],  [address]       Radiology Studies: No results found.      Scheduled Meds: . allopurinol  100 mg Oral Daily  . ascorbic acid  500 mg Oral Daily  . aspirin  81 mg Oral Daily  . atorvastatin  20 mg Oral q1800  . bumetanide (BUMEX) IV  1 mg  Intravenous Q12H  . carvedilol  6.25 mg Oral BID WC  . dapagliflozin propanediol  10 mg Oral Daily  . melatonin  5 mg Oral QHS  . pantoprazole  40 mg Oral Daily  . rivaroxaban  20 mg Oral Q supper  . sacubitril-valsartan  1 tablet Oral BID  . senna-docusate  1 tablet Oral BID  . spironolactone  12.5 mg Oral Daily   Continuous Infusions: . sodium chloride Stopped (06/20/19 1931)    Assessment/Plan:  Acute on chronic combined CHF exacerbation-Restarted on IV Bumex-, continued on Coreg Entresto and Aldactone.Daily weights, strict intake and output ,fluid restriction.Last echo 4/21 showed  LVEFisless than 20% with grade 3 diastolic dysfunction. CXR showscardiomegaly withno evidence of pulmonary edema.Elevated troponin likely demand ischemia in the setting of acute CHF.  Cardiology following and optimizing medications, started on Aldactone 12.5 mg daily.  Monitor I's and O's, cardiology contemplating increasing Bumex dose from 1 mg twice daily to 2 mg twice daily if does not have adequate urinary output.  Repeat echo pending.  Will obtain walking  desat studies as noted to be on 2 L O2, denies being on home O2.  History of cardiac thrombus continue Xarelto  Prolonged QT interval:QTc 530 on presentation. Zoloft on hold,Keep mag above 2, K above 4 . Improvement on repeat EKG- QTC 492,follow up in am, continue telemetry  AKI on CKD 3b :Creatinine baseline 2, creatinine on presentation 2.48->2.3->2.1 Congested kidney vs cardiorenal syndrome, monitor with diuresis  HTN: Controlled with coreg, entresto and bumex. Advised against cocaine use.  COPD: stable.   Anemia of chronic disease:Stable.   Substance abuse:Cocaine, cessation education provided  Sleep apnea: Reiterated CPAP use at night.  Not sure if desats at night.  Obesity: Body mass index is 37.28 kg/m.  Encouraged discussion with PCP regarding weight loss    DVT prophylaxis: Xarelto Code Status: Full code    Family / Patient Communication: NOK is son who is incarcerated. Disposition Plan:   Status is: Inpatient  Remains inpatient appropriate because:IV treatments appropriate due to intensity of illness-cardiology adjusting IV diuretics dosage   Dispo: The patient is from: Home  Anticipated d/c is to: Home  Anticipated d/c date is: 2 days  Patient currently is not medically stable to d/c.   LOS: 3 days    Time spent: 35 minutes    Guilford Shi, MD [facility] Pager in [place]  If 7PM-7AM, please contact night-coverage [email or web address] 06/22/2019, 7:58 AM

## 2019-06-22 NOTE — Progress Notes (Signed)
Chaplain followed up from AM Chaplain OR, patient requested prayer. Chaplain met with patient who advised she used cocaine and was homeless. Her body was retaining fluids. Chaplain began by asking if patient had a Education officer, museum assigned to her. Patient advised she did not know, but they said they would help, she did not know the they. Chaplain encourage patient to start with social worker, to fellow the advice of her doctor, including drug use. Chaplain inquired if she had requested prayer, she did Chaplain listened to her request and prayed for physical healing, living assistance, prayer for peace and comfort.

## 2019-06-22 NOTE — Progress Notes (Addendum)
Chart reviewed, Pt visited. More alert and sitting up in chair when ST entered. Pt just finished her meal and reported no swallowing difficulty. Rec continue with current Dys 3 diet. Only eat when fully alert. Please reconsult if any further concerns of swallowing problems.

## 2019-06-22 NOTE — Progress Notes (Signed)
*  PRELIMINARY RESULTS* Echocardiogram 2D Echocardiogram has been performed.  [name] 06/22/2019, 12:41 PM

## 2019-06-22 NOTE — Progress Notes (Signed)
[place] attempted visit with Pt as per OR to pray with Pt. Pt was asleep at this time. Ch will pass on consult to on call chaplain [place].

## 2019-06-22 NOTE — Progress Notes (Signed)
SUBJECTIVE: Patient more alert this AM. Patient states that her breathing has improved and continues to deny chest pain.   Vitals:   06/22/19 0447 06/22/19 0448 06/22/19 0743 06/22/19 1128  BP: (!) 114/100  119/86 (!) 109/94  Pulse: 73  72 79  Resp: 18  18 20   Temp:  97.7 F (36.5 C) 98.7 F (37.1 C) 98.4 F (36.9 C)  TempSrc:   Oral Oral  SpO2: 100%  100% 99%  Weight: 107.8 kg     Height:        Intake/Output Summary (Last 24 hours) at 06/22/2019 1501 Last data filed at 06/22/2019 1350 Gross per 24 hour  Intake 840 ml  Output 200 ml  Net 640 ml    LABS: Basic Metabolic Panel: Recent Labs    06/21/19 0601 06/22/19 0558  NA 139 139  K 3.8 3.6  CL 104 104  CO2 25 27  GLUCOSE 109* 134*  BUN 39* 38*  CREATININE 2.12* 2.01*  CALCIUM 8.4* 8.4*  MG 2.2  --    Liver Function Tests: No results for input(s): AST, ALT, ALKPHOS, BILITOT, PROT, ALBUMIN in the last 72 hours. No results for input(s): LIPASE, AMYLASE in the last 72 hours. CBC: No results for input(s): WBC, NEUTROABS, HGB, HCT, MCV, PLT in the last 72 hours. Cardiac Enzymes: No results for input(s): CKTOTAL, CKMB, CKMBINDEX, TROPONINI in the last 72 hours. BNP: Invalid input(s): POCBNP D-Dimer: No results for input(s): DDIMER in the last 72 hours. Hemoglobin A1C: No results for input(s): HGBA1C in the last 72 hours. Fasting Lipid Panel: No results for input(s): CHOL, HDL, LDLCALC, TRIG, CHOLHDL, LDLDIRECT in the last 72 hours. Thyroid Function Tests: No results for input(s): TSH, T4TOTAL, T3FREE, THYROIDAB in the last 72 hours.  Invalid input(s): FREET3 Anemia Panel: No results for input(s): VITAMINB12, FOLATE, FERRITIN, TIBC, IRON, RETICCTPCT in the last 72 hours.   PHYSICAL EXAM General: Well developed, well nourished, in no acute distress HEENT:  Normocephalic and atramatic Neck:  No JVD.  Lungs: Clear bilaterally to auscultation and percussion. Heart: HRRR . Normal S1 and S2 without gallops or  murmurs.  Abdomen: Bowel sounds are positive, abdomen soft and non-tender  Msk:  Back normal, normal gait. Normal strength and tone for age. Extremities: No clubbing, cyanosis. +2 BLE  edema.   Neuro: Alert and oriented X 3. Psych:  Good affect, responds appropriately  TELEMETRY: NSR 60s/bpm  ASSESSMENT AND PLAN: Patient presented to the emergency department with shortness of breath and worsening bilateral extremity edema.  Patient did initially have a mild increase in troponin but this was most likely in the setting of demand ischemia due to her CKD. patient with LVEF <20% and grade III diastolic dysfunction.  Will continue to optimize with carvedilol and Entresto. Patient started on Aldactone 12.5mg  daily and kidney function has slightly improved. HR more appropriate in the 60s, therefore continue carvedilol 6.25 mg twice daily.  Please continue to diurese the patient with Bumex 1 mg twice daily, but please ensure accurate I/Os. If patient continues to have low urine output, will increase bumex to 2mg  bid. Continue Xarelto with her history of cardiac thrombus.  Hypertension well-controlled at this time.  QTc continues to be controlled. Please reiterate the importance of the patient wears her CPAP at night. Awaiting echocardiogram. Will continue to follow  Principal Problem:   Acute on chronic combined systolic and diastolic CHF (congestive heart failure) (HCC) Active Problems:   Dilated cardiomyopathy (HCC)   Elevated troponin  CKD (chronic kidney disease), stage III   Acute on chronic combined systolic (congestive) and diastolic (congestive) heart failure (HCC)   History of anemia due to chronic kidney disease    Adaline Sill, NP-C 06/22/2019 3:01 PM

## 2019-06-23 DIAGNOSIS — Z5181 Encounter for therapeutic drug level monitoring: Secondary | ICD-10-CM

## 2019-06-23 LAB — URINE DRUG SCREEN, QUALITATIVE (ARMC ONLY)
Amphetamines, Ur Screen: NOT DETECTED
Barbiturates, Ur Screen: NOT DETECTED
Benzodiazepine, Ur Scrn: NOT DETECTED
Cannabinoid 50 Ng, Ur ~~LOC~~: NOT DETECTED
Cocaine Metabolite,Ur ~~LOC~~: POSITIVE — AB
MDMA (Ecstasy)Ur Screen: NOT DETECTED
Methadone Scn, Ur: NOT DETECTED
Opiate, Ur Screen: NOT DETECTED
Phencyclidine (PCP) Ur S: NOT DETECTED
Tricyclic, Ur Screen: NOT DETECTED

## 2019-06-23 LAB — BASIC METABOLIC PANEL
Anion gap: 9 (ref 5–15)
Anion gap: 8 (ref 5–15)
BUN: 40 mg/dL — ABNORMAL HIGH (ref 8–23)
BUN: 40 mg/dL — ABNORMAL HIGH (ref 8–23)
CO2: 30 mmol/L (ref 22–32)
CO2: 28 mmol/L (ref 22–32)
Calcium: 8.8 mg/dL — ABNORMAL LOW (ref 8.9–10.3)
Calcium: 8.8 mg/dL — ABNORMAL LOW (ref 8.9–10.3)
Chloride: 103 mmol/L (ref 98–111)
Chloride: 104 mmol/L (ref 98–111)
Creatinine, Ser: 1.91 mg/dL — ABNORMAL HIGH (ref 0.44–1.00)
Creatinine, Ser: 1.88 mg/dL — ABNORMAL HIGH (ref 0.44–1.00)
GFR calc Af Amer: 32 mL/min — ABNORMAL LOW (ref >60)
GFR calc Af Amer: 32 mL/min — ABNORMAL LOW (ref >60)
GFR calc non Af Amer: 27 mL/min — ABNORMAL LOW (ref >60)
GFR calc non Af Amer: 28 mL/min — ABNORMAL LOW (ref >60)
Glucose, Bld: 121 mg/dL — ABNORMAL HIGH (ref 70–99)
Glucose, Bld: 127 mg/dL — ABNORMAL HIGH (ref 70–99)
Potassium: 4 mmol/L (ref 3.5–5.1)
Potassium: 4 mmol/L (ref 3.5–5.1)
Sodium: 142 mmol/L (ref 135–145)
Sodium: 140 mmol/L (ref 135–145)

## 2019-06-23 LAB — PHOSPHORUS: Phosphorus: 3 mg/dL (ref 2.5–4.6)

## 2019-06-23 LAB — MAGNESIUM: Magnesium: 2 mg/dL (ref 1.7–2.4)

## 2019-06-23 MED ORDER — WITCH HAZEL-GLYCERIN EX PADS
MEDICATED_PAD | CUTANEOUS | Status: DC | PRN
  Filled 2019-06-23: qty 100

## 2019-06-23 MED ORDER — FLUOXETINE HCL 20 MG PO CAPS
20.0000 mg | ORAL_CAPSULE | Freq: Every day | ORAL | Status: DC
  Administered 2019-06-23 – 2019-06-24 (×2): 20 mg via ORAL
  Filled 2019-06-23 (×2): qty 1

## 2019-06-23 MED ORDER — RIVAROXABAN 20 MG PO TABS
20.0000 mg | ORAL_TABLET | Freq: Every day | ORAL | Status: DC
  Filled 2019-06-23: qty 1

## 2019-06-23 MED ORDER — BUMETANIDE 0.25 MG/ML IJ SOLN
2.0000 mg | Freq: Two times a day (BID) | INTRAMUSCULAR | Status: DC
  Administered 2019-06-23: 2 mg via INTRAVENOUS
  Filled 2019-06-23 (×2): qty 8

## 2019-06-23 MED ORDER — SPIRONOLACTONE 25 MG PO TABS
25.0000 mg | ORAL_TABLET | Freq: Every day | ORAL | Status: DC
  Administered 2019-06-24: 25 mg via ORAL
  Filled 2019-06-23: qty 1

## 2019-06-23 MED ORDER — BUMETANIDE 0.25 MG/ML IJ SOLN
1.0000 mg | Freq: Once | INTRAMUSCULAR | Status: AC
  Administered 2019-06-23: 1 mg via INTRAVENOUS
  Filled 2019-06-23: qty 4

## 2019-06-23 MED ORDER — MAGNESIUM SULFATE IN D5W 1-5 GM/100ML-% IV SOLN
1.0000 g | Freq: Once | INTRAVENOUS | Status: AC
  Administered 2019-06-23: 1 g via INTRAVENOUS
  Filled 2019-06-23: qty 100

## 2019-06-23 NOTE — Progress Notes (Signed)
Notified by a staff member there was a patient on LL standing by the employee entrance asking people for a lighter. When I went to lower level noted patient standing across from employee entrance on LL. She was there with a walker. Requested she return to her room. Pt agreed, but said she was going to go outside, that it was too much of the same being in the hospital for 5 days. I explained if she went off the unit or went outside, we would not be able to keep her safe, we would not know where she was. Explained it could result in her death if she had an issue with her heart or breathing when she was off the unit. On way back to room, met by patient's RN. She stated she had explained same thing to patient.

## 2019-06-23 NOTE — Progress Notes (Signed)
Pt lying in bed sleeping, [place] 1L in place O2 99%. Will continue to monitor.

## 2019-06-23 NOTE — Progress Notes (Signed)
   06/23/19 1310  Clinical Encounter Type  Visited With Family  Visit Type Follow-up  Referral From Chaplain  Consult/Referral To Chaplain  After lunch chaplain planned to visit with patient, but when chaplain got off the elevator, patient was sitting in the hall. Patient described to chaplain how she perceived an earlier conversation with a Education officer, museum.The things she told the chaplain were alarming. Since the chaplain did not hear the conversation, she was not sure what to believe, but asked the director of nurses give her any information about patient. Dewaine Oats was on her way to a meeting, therefore did not have time to talk. Chaplain will touch basis with [facility] tomorrow regarding this patient. Per patient she needs housing, but Education officer, museum said that every time ems picks patient up, they pick her up from a residence. Chaplain will also follow up with patient tomorrow.

## 2019-06-23 NOTE — Progress Notes (Signed)
This nurse was notified that patient ledt the unit. This nurse met the patient returning the unit accompanied by another nurse. Patient was educated on the importance of staying on the unit due to it being a monitored unit. Patient stated " I just want a cigarette." This nurse walked the patient back to her room.

## 2019-06-23 NOTE — Progress Notes (Signed)
Pt laying in bed. Spot check O2, Room air 98%.

## 2019-06-23 NOTE — Progress Notes (Addendum)
Pt called out complaining of face tightness and refused magnesium IV. Magnesium IV stopped Vitals obtained. BP 123/109 HR 73. O2 95 2LNC. MD notified. PRN Nitro administered. Pt verbalized chest discomfort & face tightness imporved. Will continue to monitor.

## 2019-06-23 NOTE — Progress Notes (Signed)
Pt was ambulating around the unit w/o assist. No complaints/distress reported.

## 2019-06-23 NOTE — Progress Notes (Signed)
Telemetry called to notify that pt had 8 beat run V tach. This nurse assessed  Pt. Pt lying in bed appears asleep. 2L  in place. O2 100%, BP 100/71, R 22, HR 76. Patient denies any discomfort. MD notified. Will continue to monitor.

## 2019-06-23 NOTE — Progress Notes (Signed)
SUBJECTIVE: Patient resting comfortably in chair.  Patient states that her breathing has improved and continues to deny chest pain  Patient states her legs continue to be swollen and tender.   Vitals:   06/22/19 2000 06/22/19 2107 06/23/19 0338 06/23/19 0754  BP:  (!) 120/99 105/82 (!) 129/96  Pulse:  68 77 82  Resp: 19 16 18 18   Temp:  (!) 97.5 F (36.4 C) (!) 97.4 F (36.3 C)   TempSrc:  Oral Oral   SpO2:  91% 100% 100%  Weight:   107.9 kg   Height:        Intake/Output Summary (Last 24 hours) at 06/23/2019 1007 Last data filed at 06/22/2019 1907 Gross per 24 hour  Intake 960 ml  Output 1300 ml  Net -340 ml    LABS: Basic Metabolic Panel: Recent Labs    06/21/19 0601 06/21/19 0601 06/22/19 0558 06/23/19 0538  NA 139   < > 139 142  K 3.8   < > 3.6 4.0  CL 104   < > 104 103  CO2 25   < > 27 30  GLUCOSE 109*   < > 134* 121*  BUN 39*   < > 38* 40*  CREATININE 2.12*   < > 2.01* 1.91*  CALCIUM 8.4*   < > 8.4* 8.8*  MG 2.2  --   --   --    < > = values in this interval not displayed.   Liver Function Tests: No results for input(s): AST, ALT, ALKPHOS, BILITOT, PROT, ALBUMIN in the last 72 hours. No results for input(s): LIPASE, AMYLASE in the last 72 hours. CBC: No results for input(s): WBC, NEUTROABS, HGB, HCT, MCV, PLT in the last 72 hours. Cardiac Enzymes: No results for input(s): CKTOTAL, CKMB, CKMBINDEX, TROPONINI in the last 72 hours. BNP: Invalid input(s): POCBNP D-Dimer: No results for input(s): DDIMER in the last 72 hours. Hemoglobin A1C: No results for input(s): HGBA1C in the last 72 hours. Fasting Lipid Panel: No results for input(s): CHOL, HDL, LDLCALC, TRIG, CHOLHDL, LDLDIRECT in the last 72 hours. Thyroid Function Tests: No results for input(s): TSH, T4TOTAL, T3FREE, THYROIDAB in the last 72 hours.  Invalid input(s): FREET3 Anemia Panel: No results for input(s): VITAMINB12, FOLATE, FERRITIN, TIBC, IRON, RETICCTPCT in the last 72  hours.   PHYSICAL EXAM General: Well developed, well nourished, in no acute distress HEENT:  Normocephalic and atramatic Neck:  No JVD.  Lungs: Clear bilaterally to auscultation and percussion. Heart: HRRR . Normal S1 and S2 without gallops or murmurs.  Abdomen: Bowel sounds are positive, abdomen soft and non-tender  Msk:  Back normal, normal gait. Normal strength and tone for age. Extremities: No clubbing, cyanosis. +2 BLE edema.   Neuro: Alert and oriented X 3. Psych:  Good affect, responds appropriately  TELEMETRY: 80s bpm  ASSESSMENT AND PLAN: Patient presenting to the emergency department with acute on chronic combined HFpEF/HFrEF.  Echocardiogram revealed LVEF 25-30%, mild LVH with grade 3 diastolic dysfunction, severely enlarged RV, and moderately elevated PA pressure.  We will continue to optimize patient's heart failure medication by increasing her spironolactone to 25 mg daily and continuing her carvedilol and Entresto at its current doses.  With the patient remaining edematous the goal is for her to be net negative and therefore will increase Bumex to 2 mg twice daily.  Please continue strict I&O's.  Patient again states she did not wear her CPAP last night.  Patient educated again on the importance of this  and I spoke with the nurse at bedside to ensure this occurs.  We will continue to monitor.  Principal Problem:   Acute on chronic combined systolic and diastolic CHF (congestive heart failure) (HCC) Active Problems:   Dilated cardiomyopathy (HCC)   Elevated troponin   CKD (chronic kidney disease), stage III   Acute on chronic combined systolic (congestive) and diastolic (congestive) heart failure (HCC)   History of anemia due to chronic kidney disease    Katelyn Sill, NP-C 06/23/2019 10:07 AM

## 2019-06-23 NOTE — Progress Notes (Addendum)
SLP Cancellation Note  Patient Details Name: Wanona Stare MRN: [id number] DOB: 08/21/1955   Cancelled treatment:       Reason Eval/Treat Not Completed:  (chart reviewed; consulted NSG re: pt's status today). Pt is tolerating her oral diet well; No overt s/s of aspiration noted by NSG during meals/pill swallowing, or reported in chart notes at other meals. NSG reported pt was awake/alert this morning and ambulating around the [place] [facility] unassisted; breathing improved. Recommend continue current diet of mech soft/regular diet w/ thin liquids w/ general aspiration precautions. Recommend support at meals as needed. NSG agreed, and will reconsult ST services if any change in status while admitted.      [name], [place], CCC-SLP [place] 06/23/2019, 2:27 PM

## 2019-06-23 NOTE — Progress Notes (Addendum)
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 Troponin is elevated as well.EKG shows sinus rhythm with frequent PVCs and LVH. Hospital course: Patient admitted to [facility] for acute CHF management with cardiology consultation.   Subjective: Patient sleeping comfortably, did not want to wake up or answer questions.  Noted to be on 2 L O2 earlier today which was tapered to off and she was satting 98% on room air.  Patient however placed back on 2 L O2 this evening due to 8 beat NSVT on telemetry and her wanting to use O2 during sleep.  She denies being on home O2 or being diagnosed with sleep apnea in the past. Objective: Vitals:   06/23/19 0754 06/23/19 1246 06/23/19 1555 06/23/19 1617  BP: (!) 129/96 116/87 100/71 (!) 137/109  Pulse: 82 76 76 79  Resp: 18   19  Temp:      TempSrc:      SpO2: 100% 100% 100% 98%  Weight:      Height:        Intake/Output Summary (Last 24 hours) at 06/23/2019 1624 Last data filed at 06/23/2019 1400 Gross per 24 hour  Intake 1080 ml  Output 750 ml  Net 330 ml   Filed Weights   06/21/19 0544 06/22/19 0447 06/23/19 0338  Weight: 108 kg 107.8 kg 107.9 kg    Physical Examination:  General exam: Appears calm and comfortable  Respiratory system: Clear to auscultation. Respiratory effort okay at rest Cardiovascular system: S1 & S2 heard, RRR. No JVD, murmurs.  Trace to 1+ pedal edema. Gastrointestinal system: Abdomen is nondistended, soft and nontender. Normal bowel sounds heard. Central  nervous system: Alert and oriented. No new focal neurological deficits. Extremities: No contractures, 1+ pitting leg edema  Skin: No rashes, lesions or ulcers Psychiatry: Judgement and insight appear normal. Mood & affect appropriate.   Data Reviewed: I have personally reviewed following labs and imaging studies  CBC: Recent Labs  Lab 06/19/19 0509  WBC 4.2  NEUTROABS 1.9  HGB 10.1*  HCT 34.4*  MCV 75.3*  PLT 025   Basic Metabolic Panel: Recent Labs  Lab 06/19/19 0509 06/20/19 0640 06/21/19 0601 06/22/19 0558 06/23/19 0538  NA 140 138 139 139 142  K 3.3* 3.8 3.8 3.6 4.0  CL 102 100 104 104 103  CO2 26 26 25 27 30   GLUCOSE 103* 146* 109* 134* 121*  BUN 36* 39* 39* 38* 40*  CREATININE 2.48* 2.30* 2.12* 2.01* 1.91*  CALCIUM 8.9 8.5* 8.4* 8.4* 8.8*  MG  --   --  2.2  --   --    GFR: Estimated Creatinine Clearance: 38.1 mL/min (A) (by C-G formula based on SCr of 1.91 mg/dL (H)). Liver Function Tests: Recent Labs  Lab 06/19/19 0509  AST 31  ALT 23  ALKPHOS 211*  BILITOT 1.7*  PROT 6.1*  ALBUMIN 2.9*   No results for input(s): LIPASE, AMYLASE in the last 168 hours. No results for input(s): AMMONIA in the last 168 hours. Coagulation Profile: No results for input(s): INR, PROTIME in the last 168 hours.  Cardiac Enzymes: No results for input(s): CKTOTAL, CKMB, CKMBINDEX, TROPONINI in the last 168 hours. BNP (last 3 results) No results for input(s): PROBNP in the last 8760 hours. HbA1C: No results for input(s): HGBA1C in the last 72 hours. CBG: No results for input(s): GLUCAP in the last 168 hours. Lipid Profile: No results for input(s): CHOL, HDL, LDLCALC, TRIG, CHOLHDL, LDLDIRECT in the last 72 hours. Thyroid Function Tests: No results for input(s): TSH, T4TOTAL, FREET4, T3FREE, THYROIDAB in the last 72 hours. Anemia Panel: No results for input(s): VITAMINB12, FOLATE, FERRITIN, TIBC, IRON, RETICCTPCT in the last 72 hours. Sepsis Labs: No results for input(s):  PROCALCITON, LATICACIDVEN in the last 168 hours.  Recent Results (from the past 240 hour(s))  SARS Coronavirus 2 by RT PCR (hospital order, performed in [facility] hospital lab) Nasopharyngeal Nasopharyngeal Swab     Status: None   Collection Time: 06/19/19  9:28 AM   Specimen: Nasopharyngeal Swab  Result Value Ref Range Status   SARS Coronavirus 2 NEGATIVE NEGATIVE Final    Comment: (NOTE) SARS-CoV-2 target nucleic acids are NOT DETECTED.  The SARS-CoV-2 RNA is generally detectable in upper and lower respiratory specimens during the acute phase of infection. The lowest concentration of SARS-CoV-2 viral copies this assay can detect is 250 copies / mL. A negative result does not preclude SARS-CoV-2 infection and should not be used as the sole basis for treatment or other patient management decisions.  A negative result may occur with improper specimen collection / handling, submission of specimen other than nasopharyngeal swab, presence of viral mutation(s) within the areas targeted by this assay, and inadequate number of viral copies (<250 copies / mL). A negative result must be combined with clinical observations, patient history, and epidemiological information.  Fact Sheet for Patients:   StrictlyIdeas.no  Fact Sheet for Healthcare Providers: BankingDealers.co.za  This test is not yet approved or  cleared by the [place] FDA and has been authorized for detection and/or diagnosis of SARS-CoV-2 by FDA under an Emergency Use Authorization (EUA).  This EUA will remain in effect (meaning this test can be used) for the duration of the COVID-19 declaration under Section 564(b)(1) of the Act, 21 U.S.C. section 360bbb-3(b)(1), unless the authorization is terminated or revoked sooner.  Performed at [facility], [address] [place], [place] [address]       Radiology Studies: ECHOCARDIOGRAM COMPLETE  Result Date:  06/22/2019    ECHOCARDIOGRAM REPORT   Patient Name:   Katelyn Lane Date of Exam: 06/22/2019 Medical Rec #:  [id number]     Height:       67.0 in Accession #:    [id number]    Weight:       237.7 lb Date of Birth:  12-22-1955    BSA:          2.176 m Patient Age:    68 years      BP:           109/94 mmHg Patient Gender: F             HR:           79 bpm. Exam Location:  [facility] Procedure: 2D Echo, Cardiac Doppler and Color Doppler Indications:     CHF- Acute systolic 536.14  History:         Patient has prior history of Echocardiogram examinations, most                  recent 04/29/2019. CHF; Risk  Factors:Hypertension and                  Dyslipidemia. Covid -19 positive 03-06-19.  Sonographer:     [name] RDCS (AE) Referring Phys:  [id number] [place] Diagnosing Phys: Neoma Laming MD IMPRESSIONS  1. Left ventricular ejection fraction, by estimation, is 25 to 30%. The left ventricle has severely decreased function. The left ventricle demonstrates global hypokinesis. There is mild concentric left ventricular hypertrophy. Left ventricular diastolic  parameters are consistent with Grade III diastolic dysfunction (restrictive).  2. Right ventricular systolic function is normal. The right ventricular size is severely enlarged. There is moderately elevated pulmonary artery systolic pressure.  3. Left atrial size was severely dilated.  4. Right atrial size was severely dilated.  5. The mitral valve is normal in structure. Mild mitral valve regurgitation. No evidence of mitral stenosis.  6. The aortic valve is normal in structure. Aortic valve regurgitation is not visualized. No aortic stenosis is present.  7. The inferior vena cava is normal in size with greater than 50% respiratory variability, suggesting right atrial pressure of 3 mmHg. Conclusion(s)/Recommendation(s): Findings consistent with non-ischemic cardiomyopathy. FINDINGS  Left Ventricle: Left ventricular ejection fraction, by estimation, is 25 to 30%. The  left ventricle has severely decreased function. The left ventricle demonstrates global hypokinesis. The left ventricular internal cavity size was normal in size. There is mild concentric left ventricular hypertrophy. Left ventricular diastolic parameters are consistent with Grade III diastolic dysfunction (restrictive). Right Ventricle: The right ventricular size is severely enlarged. No increase in right ventricular wall thickness. Right ventricular systolic function is normal. There is moderately elevated pulmonary artery systolic pressure. The tricuspid regurgitant velocity is 3.53 m/s, and with an assumed right atrial pressure of 10 mmHg, the estimated right ventricular systolic pressure is 73.7 mmHg. Left Atrium: Left atrial size was severely dilated. Right Atrium: Right atrial size was severely dilated. Pericardium: Trivial pericardial effusion is present. The pericardial effusion is circumferential. Mitral Valve: The mitral valve is normal in structure. Normal mobility of the mitral valve leaflets. Mild mitral valve regurgitation. No evidence of mitral valve stenosis. Tricuspid Valve: The tricuspid valve is normal in structure. Tricuspid valve regurgitation is mild . No evidence of tricuspid stenosis. Aortic Valve: The aortic valve is normal in structure. Aortic valve regurgitation is not visualized. No aortic stenosis is present. Aortic valve mean gradient measures 3.0 mmHg. Aortic valve peak gradient measures 4.3 mmHg. Aortic valve area, by VTI measures 1.64 cm. Pulmonic Valve: The pulmonic valve was normal in structure. Pulmonic valve regurgitation is mild. No evidence of pulmonic stenosis. Aorta: The aortic root is normal in size and structure. Venous: The inferior vena cava is normal in size with greater than 50% respiratory variability, suggesting right atrial pressure of 3 mmHg. IAS/Shunts: No atrial level shunt detected by color flow Doppler.  LEFT VENTRICLE PLAX 2D LVIDd:         6.46 cm LVIDs:          5.85 cm LV PW:         1.43 cm LV IVS:        1.04 cm LVOT diam:     2.10 cm LV SV:         24 LV SV Index:   11 LVOT Area:     3.46 cm  LV Volumes (MOD) LV vol d, MOD A2C: 325.0 ml LV vol d, MOD A4C: 280.0 ml LV vol s, MOD A2C: 227.0 ml LV vol s, MOD  A4C: 213.0 ml LV SV MOD A2C:     98.0 ml LV SV MOD A4C:     280.0 ml LV SV MOD BP:      93.8 ml RIGHT VENTRICLE RV S prime:     11.10 cm/s TAPSE (M-mode): 2.4 cm LEFT ATRIUM            Index       RIGHT ATRIUM           Index LA diam:      4.80 cm  2.21 cm/m  RA Area:     29.20 cm LA Vol (A4C): 118.0 ml 54.22 ml/m RA Volume:   103.00 ml 47.33 ml/m  AORTIC VALVE                   PULMONIC VALVE AV Area (Vmax):    1.55 cm    PV Vmax:        0.37 m/s AV Area (Vmean):   1.24 cm    PV Peak grad:   0.6 mmHg AV Area (VTI):     1.64 cm    RVOT Peak grad: 2 mmHg AV Vmax:           104.00 cm/s AV Vmean:          78.900 cm/s AV VTI:            0.144 m AV Peak Grad:      4.3 mmHg AV Mean Grad:      3.0 mmHg LVOT Vmax:         46.60 cm/s LVOT Vmean:        28.200 cm/s LVOT VTI:          0.068 m LVOT/AV VTI ratio: 0.47  AORTA Ao Root diam: 2.40 cm MITRAL VALVE                TRICUSPID VALVE MV Area (PHT): 10.25 cm    TR Peak grad:   49.8 mmHg MV Decel Time: 74 msec      TR Vmax:        353.00 cm/s MV E velocity: 62.60 cm/s MV A velocity: 105.00 cm/s  SHUNTS MV E/A ratio:  0.60         Systemic VTI:  0.07 m                             Systemic Diam: 2.10 cm Neoma Laming MD Electronically signed by Neoma Laming MD Signature Date/Time: 06/22/2019/8:52:48 PM    Final         Scheduled Meds: . allopurinol  100 mg Oral Daily  . ascorbic acid  500 mg Oral Daily  . aspirin  81 mg Oral Daily  . atorvastatin  20 mg Oral q1800  . bumetanide (BUMEX) IV  2 mg Intravenous Q12H  . carvedilol  6.25 mg Oral BID WC  . dapagliflozin propanediol  10 mg Oral Daily  . FLUoxetine  20 mg Oral Daily  . melatonin  5 mg Oral QHS  . pantoprazole  40 mg Oral Daily  . rivaroxaban  20  mg Oral Q supper  . sacubitril-valsartan  1 tablet Oral BID  . senna-docusate  1 tablet Oral BID  . [START ON 06/24/2019] spironolactone  25 mg Oral Daily   Continuous Infusions: . sodium chloride Stopped (06/20/19 1931)    Assessment/Plan:  Acute on chronic combined CHF exacerbation-Restarted on IV Bumex-, continued on Coreg Entresto and  Aldactone.Daily weights, strict intake and output ,fluid restriction.CXR showscardiomegaly withno evidence of pulmonary edema.Elevated troponin likely demand ischemia in the setting of acute CHF.  Cardiology following and optimizing medications, started on Aldactone 12.5 mg daily.  Monitor I's and O's, cardiology increasing Bumex dose from 1 mg twice daily to 2 mg twice daily today.  Repeat echo shows similar findings as before-grade 3 diastolic dysfunction and EF 20 to 25%.Marland Kitchen    History of cardiac thrombus continue Xarelto  Prolonged QT interval/NSVT: Patient had episode of 8 beat NSVT/wide-complex tachycardia today.  Asymptomatic.  Was saturating 98% when O2 tapered to off earlier.  She does have history of cocaine abuse, recheck urine drug screen.  Continue beta-blockers.  QTc 530 on presentation. Keep mag above 2, K above 4 . Improvement on repeat EKG- QTC 492-->today at 470 ms.  AKI on CKD 3b :Creatinine baseline 2, creatinine on presentation 2.48->2.3->2.1->1.9 Congested kidney vs cardiorenal syndrome, monitor with diuresis  HTN: Controlled with coreg, entresto and bumex. Advised against cocaine use.  COPD: stable.   Anemia of chronic disease:Stable.   Substance abuse:Cocaine, cessation education provided  ?  Sleep apnea: Reiterated CPAP use at night per cardiology advised but patient claims she has never been diagnosed with sleep apnea or issued CPAP at home..  She now wants to use O2 nasal cannula during sleep.  Not sure if desats at night-requested nurse to monitor.  Depression:Zoloft  held on admission and concern for QT  prolongation.  Will resume in a.m.  Obesity: Body mass index is 37.28 kg/m.  Encouraged discussion with PCP regarding weight loss    DVT prophylaxis: Xarelto Code Status: Full code  Family / Patient Communication: NOK is son who is incarcerated. Disposition Plan:   Status is: Inpatient  Remains inpatient appropriate because:IV treatments appropriate due to intensity of illness-cardiology adjusting IV diuretics dosage   Dispo: The patient is from: Home  Anticipated d/c is to: Home, social worker looking into homeless situation.  Anticipated d/c date is: 2 days if cleared by cardiology  Patient currently is not medically stable to d/c.   LOS: 4 days    Time spent: 35 minutes    Guilford Shi, MD [facility] Pager in [place]  If 7PM-7AM, please contact night-coverage [email or web address] 06/23/2019, 4:24 PM

## 2019-06-24 DIAGNOSIS — N179 Acute kidney failure, unspecified: Secondary | ICD-10-CM

## 2019-06-24 LAB — BASIC METABOLIC PANEL
Anion gap: 12 (ref 5–15)
BUN: 39 mg/dL — ABNORMAL HIGH (ref 8–23)
CO2: 27 mmol/L (ref 22–32)
Calcium: 8.8 mg/dL — ABNORMAL LOW (ref 8.9–10.3)
Chloride: 102 mmol/L (ref 98–111)
Creatinine, Ser: 1.82 mg/dL — ABNORMAL HIGH (ref 0.44–1.00)
GFR calc Af Amer: 34 mL/min — ABNORMAL LOW (ref >60)
GFR calc non Af Amer: 29 mL/min — ABNORMAL LOW (ref >60)
Glucose, Bld: 115 mg/dL — ABNORMAL HIGH (ref 70–99)
Potassium: 3.5 mmol/L (ref 3.5–5.1)
Sodium: 141 mmol/L (ref 135–145)

## 2019-06-24 LAB — CBC
HCT: 29.6 % — ABNORMAL LOW (ref 36.0–46.0)
Hemoglobin: 9.3 g/dL — ABNORMAL LOW (ref 12.0–15.0)
MCH: 22.5 pg — ABNORMAL LOW (ref 26.0–34.0)
MCHC: 31.4 g/dL (ref 30.0–36.0)
MCV: 71.5 fL — ABNORMAL LOW (ref 80.0–100.0)
Platelets: 241 10*3/uL (ref 150–400)
RBC: 4.14 MIL/uL (ref 3.87–5.11)
RDW: 20.6 % — ABNORMAL HIGH (ref 11.5–15.5)
WBC: 3.8 10*3/uL — ABNORMAL LOW (ref 4.0–10.5)
nRBC: 0 % (ref 0.0–0.2)

## 2019-06-24 MED ORDER — DAPAGLIFLOZIN PROPANEDIOL 10 MG PO TABS: 10.0000 mg | ORAL_TABLET | Freq: Every day | ORAL | Status: DC

## 2019-06-24 MED ORDER — CARVEDILOL 6.25 MG PO TABS: 3.1250 mg | ORAL_TABLET | Freq: Two times a day (BID) | ORAL | 2 refills | Status: DC

## 2019-06-24 MED ORDER — BUMETANIDE 1 MG PO TABS
1.0000 mg | ORAL_TABLET | Freq: Two times a day (BID) | ORAL | Status: DC
  Administered 2019-06-24: 1 mg via ORAL
  Filled 2019-06-24 (×3): qty 1

## 2019-06-24 MED ORDER — SPIRONOLACTONE 25 MG PO TABS: 25.0000 mg | ORAL_TABLET | Freq: Every day | ORAL | 1 refills | Status: DC

## 2019-06-24 MED ORDER — BUMETANIDE 1 MG PO TABS: 1.0000 mg | ORAL_TABLET | Freq: Two times a day (BID) | ORAL | 2 refills | Status: DC

## 2019-06-24 MED ORDER — LORAZEPAM 2 MG/ML IJ SOLN
1.5000 mg | Freq: Once | INTRAMUSCULAR | Status: AC
  Administered 2019-06-24: 1.5 mg via INTRAVENOUS
  Filled 2019-06-24: qty 1

## 2019-06-24 MED ORDER — MELATONIN 5 MG PO TABS: 5.0000 mg | ORAL_TABLET | Freq: Every day | ORAL | 0 refills | Status: AC

## 2019-06-24 NOTE — TOC Progression Note (Signed)
Transition of Care [facility]) - Progression Note    Patient Details  Name: Katelyn Lane MRN: [id number] Date of Birth: 21-Mar-1955  Transition of Care [facility]) CM/SW [place], RN Phone Number: 06/24/2019, 9:50 AM  Clinical Narrative:    Met with patient today, we went over substance abuse resources and I offered to provide another list of homeless shelters.  These resources are provided each admission.  [facility] reports [facility] admit her.  Patient does not qualify for a skilled nursing rehab stay.  She is reporting bed bugs in her home, so she doesn't like living there.  Patient has been set up with Heart Failure Para-medicine in the past, but would not allow Christy the Paramedic to visit her, she cancels when she calls.    Patient appeared upset with me because the hospital cannot provide housing for her.  I explained again that I can give her the list of homeless shelters in the area.  The shelters require the individual to reach out and go through admitting process to shelter.  Patient asked if she could talk to [place].  I told her I would be happy to put in a Chaplain consult, but I am not sure the exact Chaplain that would come to see her, but she could ask for him when they arrived.             Expected Discharge Plan and Services                                                 Social Determinants of Health (SDOH) Interventions    Readmission Risk Interventions Readmission Risk Prevention Plan 03/27/2019 03/09/2019  Transportation Screening Complete Complete  Medication Review Press photographer) (No Data) Referral to Pharmacy  PCP or Specialist appointment within 3-5 days of discharge Complete Complete  HRI or Home Care Consult Complete -  SW Recovery Care/Counseling Consult Complete -  Palliative Care Screening Not Applicable Not [place] Not Applicable Not Applicable  Some recent data  might be hidden

## 2019-06-24 NOTE — Progress Notes (Signed)
   06/24/19 1300  Clinical Encounter Type  Visited With Patient  Visit Type Follow-up  Referral From Chaplain  Consult/Referral To Chaplain  As chaplain was walking in the door NT told her a chaplain was here to see her and she called out Katelyn Lane. Chaplain entered the room asking patient how she was feeling and she said better. Patient was happy that [facility] visited with her. She said that [name] was good for her spirit. Although she is feeling better, patient mentioned things bothering her mentally and emotionally. The main thing on patient's mind is housing. According to patient, [name] asked her what she is looking for. Whether [name] is able to help or not the mire fact that she visited means so much. Patient talked about being evicted, telling chaplain that she was suppose to move her things out by May 29th. Chaplain encouraged patient not to try walking out to stay until she is discharged. Chaplain prayed with patient and left telling her that she will check in on her later.

## 2019-06-24 NOTE — Discharge Summary (Addendum)
Physician Discharge Summary  Katelyn Lane FXT:[id number] DOB: Jun 06, 1955 DOA: 06/19/2019  PCP: [name], MD  Admit date: 06/19/2019 Discharge date: 06/24/2019 Consultations: Cardiology Admitted From: home Disposition: home  Discharge Diagnoses:  Principal Problem:   Acute on chronic combined systolic and diastolic CHF (congestive heart failure) ([place]) Active Problems:   Acute kidney injury superimposed on CKD (HCC)   Elevated troponin   Prolonged QT interval   Dilated cardiomyopathy (HCC)   Depression   Crack cocaine use   LV (left ventricular) mural thrombus   CKD (chronic kidney disease), stage III   Acute on chronic combined systolic (congestive) and diastolic (congestive) heart failure (HCC)   History of anemia due to chronic kidney disease   Hospital Course Summary: 64 year old femalewith h/o HTN,chronic combined HFpEF and HFrEF (<20% in 4/21),cardiac thrombuson Xarelto who follows [facility] HF clinic,COPD,CKD III and substance abuse,presented to the emergency department with increased bilateral lower extremity edema,weight gain,shortness of breath and associated chest tightness ED Course:elevated BNP levelof > 4500, AKI (creatinine of 2->2.48). Troponin is elevated as well.EKG shows sinus rhythm with frequent PVCs and LVH.CXR showedcardiomegaly withno evidence of pulmonary edema Hospital course: [facility] with cardiology consultation.  Acute on chronic combined CHF exacerbation-Patient restarted on IV diuretics (takes lasix at home, been on Bumex here)-, continued on Coreg Entresto and Aldactone.Daily weights, strict intake and output ,fluid restriction..Elevated troponin likely demand ischemia in the setting of acute CHF.  Cardiology followed patient while here and optimized medications, started on Aldactone 12.5 mg daily. Bumex dose was briefly increased from 1 mg twice daily to 2 mg twice daily due to persistent leg  swellings.  Patient did not appear to have any pulmonary edema or hypoxia.  She subjectively reported dyspnea but was saturating 98%-100% on room air.Repeat echo showed similar findings as before-grade 3 diastolic dysfunction and EF 20 to 25%.. Patient feels better today and cleared by cardiology for discharge with instructions to take Bumex 1 mg p.o. twice daily at home with additional dosing as needed.  Walking desaturation studies obtained prior to discharge during which she was able to maintain O2 sat in the high 90s.   History of cardiac thrombus continue Xarelto  Prolonged QT interval/NSVT: Patient had an episode of 8 beat NSVT/wide-complex tachycardia.  Asymptomatic.  Was saturating 98% when O2 tapered to off earlier.  She does have history of cocaine abuse, recheck urine drug screen did test positive for cocaine.  Magnesium stable at 2, phosphorus at 3 and potassium at 4.0 yesterday. Continue beta-blockers.  QTc530 on presentation. Improvement onrepeat EKG-QTC 492-->now improved at 470 ms. Should have repeat EKG through PCP  AKI on CKD 3b:Creatinine baseline 2, creatinine on presentation 2.48->2.3->2.1->1.9->1.8 now Congested kidney vscardiorenal syndrome, monitored closely while inpatient with diuresis.  Initially Aldactone held but cleared for resuming upon discharge.  HTN: Controlled with coreg, entresto and bumex. Advised against cocaine use.  COPD: stable.   Anemia of chronic disease:Stable.  Substance abuse:Cocaine, cessation education provided  Depression:  Prozac held on admission but can be resumed on discharge. F/u EKG with PCP  Obesity: Body mass index is 37.28 kg/m.  Encouraged discussion with PCP regarding weight loss   Discharge Exam:  Vitals:   06/24/19 0631 06/24/19 0836 06/24/19 0947 06/24/19 1223  BP: (!) 135/96 (!) 139/98  113/81  Pulse: 80 75 88 70  Resp: (!) 24 (!) 22  (!) 22  Temp: 97.8 F (36.6 C) (!) 97.5 F (36.4 C)  98.6 F (  37 C)   TempSrc: Oral Oral    SpO2: 100% 100%  100%  Weight:      Height:        General: Pt is alert, awake, not in acute distress Cardiovascular: RRR, S1/S2 +, no rubs, no gallops Respiratory: CTA bilaterally, no wheezing, no rhonchi Abdominal: Soft, NT, ND, bowel sounds + Extremities: Improved leg edema to 1+, no cyanosis  Discharge Condition:Stable CODE STATUS: Full code Diet recommendation: Low-salt, low-fat cardiac healthy diet Recommendations for Outpatient Follow-up:  1. Follow up with PCP: 5 to 7 days 2. Follow up with consultants: Cardiology clinic 1 to 2 weeks, ambulatory referral to heart failure clinic active 3. Please obtain follow up labs including: BMP in 5 days, Repeat EKG for qt f/u  Home Health services upon discharge:  Equipment/Devices upon discharge:   Discharge Instructions:  Discharge Instructions    ([place]) Call MD:  Anytime you have any of the following symptoms: 1) 3 pound weight gain in 24 hours or 5 pounds in 1 week 2) shortness of breath, with or without a dry hacking cough 3) swelling in the hands, feet or stomach 4) if you have to sleep on extra pillows at night in order to breathe.   Complete by: As directed    AMB referral to CHF clinic   Complete by: As directed    Avoid straining   Complete by: As directed    Call MD for:  difficulty breathing, headache or visual disturbances   Complete by: As directed    Call MD for:  persistant dizziness or light-headedness   Complete by: As directed    Call MD for:  persistant nausea and vomiting   Complete by: As directed    Call MD for:  severe uncontrolled pain   Complete by: As directed    Call MD for:  temperature >100.4   Complete by: As directed    Diet - low sodium heart healthy   Complete by: As directed    Discharge instructions   Complete by: As directed    PCP in 5- 7 days , cardiology clinic in 1-2 weeks   Heart Failure patients record your daily weight using the same scale  at the same time of day   Complete by: As directed    Increase activity slowly   Complete by: As directed    STOP any activity that causes chest pain, shortness of breath, dizziness, sweating, or exessive weakness   Complete by: As directed      Allergies as of 06/24/2019      Reactions   Penicillins Hives, Rash, Other (See Comments)   Did it involve swelling of the face/tongue/throat, SOB, or low BP? No Did it involve sudden or severe rash/hives, skin peeling, or any reaction on the inside of your mouth or nose? Yes Did you need to seek medical attention at a hospital or doctor's office? Yes When did it last happen? >25 years If all above answers are "NO", may proceed with cephalosporin use.   Ace Inhibitors Nausea And Vomiting      Medication List    STOP taking these medications   furosemide 40 MG tablet Commonly known as: Lasix     TAKE these medications   albuterol 108 (90 Base) MCG/ACT inhaler Commonly known as: VENTOLIN HFA Inhale 2 puffs into the lungs every 6 (six) hours as needed for wheezing or shortness of breath.   allopurinol 100 MG tablet Commonly known as: Zyloprim Take  1 tablet (100 mg total) by mouth daily.   ascorbic acid 500 MG tablet Commonly known as: VITAMIN C Take 500 mg by mouth daily.   aspirin 81 MG chewable tablet Chew 1 tablet (81 mg total) by mouth daily.   atorvastatin 20 MG tablet Commonly known as: LIPITOR Take 1 tablet (20 mg total) by mouth daily at 6 PM.   bumetanide 1 MG tablet Commonly known as: BUMEX Take 1 tablet (1 mg total) by mouth 2 (two) times daily. May take additional dose for worsening leg swellings/shortness of breath   carvedilol 6.25 MG tablet Commonly known as: COREG Take 0.5 tablets (3.125 mg total) by mouth 2 (two) times daily with a meal. What changed: medication strength   docusate calcium 240 MG capsule Commonly known as: SURFAK Take 240 mg by mouth daily as needed for moderate constipation.    Entresto 49-51 MG Generic drug: sacubitril-valsartan Take 1 tablet by mouth 2 (two) times daily.   FLUoxetine 20 MG capsule Commonly known as: PROZAC Take 1 capsule (20 mg total) by mouth daily.   Ipratropium-Albuterol 20-100 MCG/ACT Aers respimat Commonly known as: COMBIVENT Inhale 1 puff into the lungs every 6 (six) hours.   loratadine 10 MG tablet Commonly known as: CLARITIN Take 10 mg by mouth daily as needed for allergies.   melatonin 5 MG Tabs Take 1 tablet (5 mg total) by mouth at bedtime.   nitroGLYCERIN 0.4 MG SL tablet Commonly known as: NITROSTAT Place 1 tablet (0.4 mg total) under the tongue every 5 (five) minutes as needed for chest pain.   pantoprazole 40 MG tablet Commonly known as: Protonix Take 1 tablet (40 mg total) by mouth daily.   rivaroxaban 20 MG Tabs tablet Commonly known as: XARELTO Take 1 tablet (20 mg total) by mouth daily with supper. For the blood clot in your heart.   spironolactone 25 MG tablet Commonly known as: ALDACTONE Take 1 tablet (25 mg total) by mouth daily. Start taking on: June 25, 2019       Follow-up Information    [place].   Specialty: Emergency Medicine Why: If symptoms worsen Contact information: [place] [phone] [address] [place] [phone]       [place] Follow up on 07/15/2019.   Specialty: Cardiology Why: at 1:00pm. Enter through the [place] entrance Contact information: [place] [address] [place]             Allergies  Allergen Reactions  . Penicillins Hives, Rash and Other (See Comments)    Did it involve swelling of the face/tongue/throat, SOB, or low BP? No Did it involve sudden or severe rash/hives, skin peeling, or any reaction on the inside of your mouth or nose? Yes Did you need to seek medical attention at a  hospital or doctor's office? Yes When did it last happen? >25 years If all above answers are "NO", may proceed with cephalosporin use.   . Ace Inhibitors Nausea And Vomiting      The results of significant diagnostics from this hospitalization (including imaging, microbiology, ancillary and laboratory) are listed below for reference.    Labs: BNP (last 3 results) Recent Labs    06/03/19 0403 06/03/19 1852 06/19/19 0509  BNP >4,500.0* >4,500.0* >2,876.8*   Basic Metabolic Panel: Recent Labs  Lab 06/21/19 0601 06/22/19 0558 06/23/19 0538 06/23/19 1647 06/24/19 0620  NA 139 139 142 140 141  K 3.8 3.6 4.0 4.0 3.5  CL 104 104 103 104 102  CO2 25 27 30 28 27   GLUCOSE 109* 134* 121* 127* 115*  BUN 39* 38* 40* 40* 39*  CREATININE 2.12* 2.01* 1.91* 1.88* 1.82*  CALCIUM 8.4* 8.4* 8.8* 8.8* 8.8*  MG 2.2  --   --  2.0  --   PHOS  --   --   --  3.0  --    Liver Function Tests: Recent Labs  Lab 06/19/19 0509  AST 31  ALT 23  ALKPHOS 211*  BILITOT 1.7*  PROT 6.1*  ALBUMIN 2.9*   No results for input(s): LIPASE, AMYLASE in the last 168 hours. No results for input(s): AMMONIA in the last 168 hours. CBC: Recent Labs  Lab 06/19/19 0509 06/24/19 0620  WBC 4.2 3.8*  NEUTROABS 1.9  --   HGB 10.1* 9.3*  HCT 34.4* 29.6*  MCV 75.3* 71.5*  PLT 336 241   Cardiac Enzymes: No results for input(s): CKTOTAL, CKMB, CKMBINDEX, TROPONINI in the last 168 hours. BNP: Invalid input(s): POCBNP CBG: No results for input(s): GLUCAP in the last 168 hours. D-Dimer No results for input(s): DDIMER in the last 72 hours. Hgb A1c No results for input(s): HGBA1C in the last 72 hours. Lipid Profile No results for input(s): CHOL, HDL, LDLCALC, TRIG, CHOLHDL, LDLDIRECT in the last 72 hours. Thyroid function studies No results for input(s): TSH, T4TOTAL, T3FREE, THYROIDAB in the last 72 hours.  Invalid input(s): FREET3 Anemia work up No results for input(s): VITAMINB12, FOLATE,  FERRITIN, TIBC, IRON, RETICCTPCT in the last 72 hours. Urinalysis    Component Value Date/Time   COLORURINE YELLOW (A) 05/21/2019 2116   APPEARANCEUR HAZY (A) 05/21/2019 2116   LABSPEC 1.010 05/21/2019 2116   PHURINE 7.0 05/21/2019 2116   GLUCOSEU NEGATIVE 05/21/2019 2116   HGBUR NEGATIVE 05/21/2019 2116   BILIRUBINUR NEGATIVE 05/21/2019 2116   [place] NEGATIVE 05/21/2019 2116   PROTEINUR 30 (A) 05/21/2019 2116   NITRITE NEGATIVE 05/21/2019 2116   LEUKOCYTESUR NEGATIVE 05/21/2019 2116   Sepsis Labs Invalid input(s): PROCALCITONIN,  WBC,  LACTICIDVEN Microbiology Recent Results (from the past 240 hour(s))  SARS Coronavirus 2 by RT PCR (hospital order, performed in [facility] hospital lab) Nasopharyngeal Nasopharyngeal Swab     Status: None   Collection Time: 06/19/19  9:28 AM   Specimen: Nasopharyngeal Swab  Result Value Ref Range Status   SARS Coronavirus 2 NEGATIVE NEGATIVE Final    Comment: (NOTE) SARS-CoV-2 target nucleic acids are NOT DETECTED.  The SARS-CoV-2 RNA is generally detectable in upper and lower respiratory specimens during the acute phase of infection. The lowest concentration of SARS-CoV-2 viral copies this assay can detect is 250 copies / mL. A negative result does not preclude SARS-CoV-2 infection and should not be used as the sole basis for treatment or other patient management decisions.  A negative result may occur with improper specimen collection / handling, submission of specimen other than nasopharyngeal swab, presence of viral mutation(s) within the areas targeted by this assay, and inadequate number of viral copies (<250 copies / mL). A negative result must be combined with clinical observations, patient history, and epidemiological information.  Fact Sheet for Patients:   StrictlyIdeas.no  Fact Sheet for Healthcare Providers: BankingDealers.co.za  This test is not yet approved or  cleared by  the [place] FDA and has been authorized for detection and/or diagnosis of SARS-CoV-2 by FDA under an Emergency Use Authorization (EUA).  This EUA will remain in  effect (meaning this test can be used) for the duration of the COVID-19 declaration under Section 564(b)(1) of the Act, 21 U.S.C. section 360bbb-3(b)(1), unless the authorization is terminated or revoked sooner.  Performed at [facility], [address] [place], [place] [address]     Procedures/Studies: [place] Chest 2 View  Result Date: 06/19/2019 CLINICAL DATA:  Cough and leg swelling EXAM: CHEST - 2 VIEW COMPARISON:  06/03/2019 FINDINGS: Cardiopericardial enlargement. There is no edema, consolidation, effusion, or pneumothorax. No acute osseous finding IMPRESSION: Cardiomegaly without edema. Electronically Signed   By: Monte Fantasia M.D.   On: 06/19/2019 05:38   DG Chest Port 1 View  Result Date: 06/03/2019 CLINICAL DATA:  Dyspnea EXAM: PORTABLE CHEST 1 VIEW COMPARISON:  Radiograph 05/21/2019 FINDINGS: Low lung volumes with gradient opacity towards the bases likely accentuated by atelectatic change and patient body habitus. There is some mild central vascular congestion with hazy interstitial opacities and fissural thickening. No pneumothorax or visible effusion. Cardiomegaly is similar to prior counting for differences in inflation in technique. No acute osseous or soft tissue abnormality. Telemetry leads overlie the chest. IMPRESSION: Cardiomegaly with central vascular congestion. Increasing interstitial opacity could reflect developing interstitial edema. Additional atelectatic changes in the lung bases. Electronically Signed   By: Lovena Le M.D.   On: 06/03/2019 04:50   ECHOCARDIOGRAM COMPLETE  Result Date: 06/22/2019    ECHOCARDIOGRAM REPORT   Patient Name:   TYLICIA SHERMAN Date of Exam: 06/22/2019 Medical Rec #:  [id number]     Height:       67.0 in Accession #:    [id number]    Weight:       237.7 lb Date of  Birth:  Jan 08, 1956    BSA:          2.176 m Patient Age:    65 years      BP:           109/94 mmHg Patient Gender: F             HR:           79 bpm. Exam Location:  [facility] Procedure: 2D Echo, Cardiac Doppler and Color Doppler Indications:     CHF- Acute systolic 646.80  History:         Patient has prior history of Echocardiogram examinations, most                  recent 04/29/2019. CHF; Risk Factors:Hypertension and                  Dyslipidemia. Covid -19 positive 03-06-19.  Sonographer:     [name] RDCS (AE) Referring Phys:  [id number] [place] Diagnosing Phys: Neoma Laming MD IMPRESSIONS  1. Left ventricular ejection fraction, by estimation, is 25 to 30%. The left ventricle has severely decreased function. The left ventricle demonstrates global hypokinesis. There is mild concentric left ventricular hypertrophy. Left ventricular diastolic  parameters are consistent with Grade III diastolic dysfunction (restrictive).  2. Right ventricular systolic function is normal. The right ventricular size is severely enlarged. There is moderately elevated pulmonary artery systolic pressure.  3. Left atrial size was severely dilated.  4. Right atrial size was severely dilated.  5. The mitral valve is normal in structure. Mild mitral valve regurgitation. No evidence of mitral stenosis.  6. The aortic valve is normal in structure. Aortic valve regurgitation is not visualized. No aortic stenosis is present.  7. The inferior vena cava is normal in  size with greater than 50% respiratory variability, suggesting right atrial pressure of 3 mmHg. Conclusion(s)/Recommendation(s): Findings consistent with non-ischemic cardiomyopathy. FINDINGS  Left Ventricle: Left ventricular ejection fraction, by estimation, is 25 to 30%. The left ventricle has severely decreased function. The left ventricle demonstrates global hypokinesis. The left ventricular internal cavity size was normal in size. There is mild concentric left ventricular  hypertrophy. Left ventricular diastolic parameters are consistent with Grade III diastolic dysfunction (restrictive). Right Ventricle: The right ventricular size is severely enlarged. No increase in right ventricular wall thickness. Right ventricular systolic function is normal. There is moderately elevated pulmonary artery systolic pressure. The tricuspid regurgitant velocity is 3.53 m/s, and with an assumed right atrial pressure of 10 mmHg, the estimated right ventricular systolic pressure is 24.0 mmHg. Left Atrium: Left atrial size was severely dilated. Right Atrium: Right atrial size was severely dilated. Pericardium: Trivial pericardial effusion is present. The pericardial effusion is circumferential. Mitral Valve: The mitral valve is normal in structure. Normal mobility of the mitral valve leaflets. Mild mitral valve regurgitation. No evidence of mitral valve stenosis. Tricuspid Valve: The tricuspid valve is normal in structure. Tricuspid valve regurgitation is mild . No evidence of tricuspid stenosis. Aortic Valve: The aortic valve is normal in structure. Aortic valve regurgitation is not visualized. No aortic stenosis is present. Aortic valve mean gradient measures 3.0 mmHg. Aortic valve peak gradient measures 4.3 mmHg. Aortic valve area, by VTI measures 1.64 cm. Pulmonic Valve: The pulmonic valve was normal in structure. Pulmonic valve regurgitation is mild. No evidence of pulmonic stenosis. Aorta: The aortic root is normal in size and structure. Venous: The inferior vena cava is normal in size with greater than 50% respiratory variability, suggesting right atrial pressure of 3 mmHg. IAS/Shunts: No atrial level shunt detected by color flow Doppler.  LEFT VENTRICLE PLAX 2D LVIDd:         6.46 cm LVIDs:         5.85 cm LV PW:         1.43 cm LV IVS:        1.04 cm LVOT diam:     2.10 cm LV SV:         24 LV SV Index:   11 LVOT Area:     3.46 cm  LV Volumes (MOD) LV vol d, MOD A2C: 325.0 ml LV vol d, MOD  A4C: 280.0 ml LV vol s, MOD A2C: 227.0 ml LV vol s, MOD A4C: 213.0 ml LV SV MOD A2C:     98.0 ml LV SV MOD A4C:     280.0 ml LV SV MOD BP:      93.8 ml RIGHT VENTRICLE RV S prime:     11.10 cm/s TAPSE (M-mode): 2.4 cm LEFT ATRIUM            Index       RIGHT ATRIUM           Index LA diam:      4.80 cm  2.21 cm/m  RA Area:     29.20 cm LA Vol (A4C): 118.0 ml 54.22 ml/m RA Volume:   103.00 ml 47.33 ml/m  AORTIC VALVE                   PULMONIC VALVE AV Area (Vmax):    1.55 cm    PV Vmax:        0.37 m/s AV Area (Vmean):   1.24 cm    PV Peak grad:  0.6 mmHg AV Area (VTI):     1.64 cm    RVOT Peak grad: 2 mmHg AV Vmax:           104.00 cm/s AV Vmean:          78.900 cm/s AV VTI:            0.144 m AV Peak Grad:      4.3 mmHg AV Mean Grad:      3.0 mmHg LVOT Vmax:         46.60 cm/s LVOT Vmean:        28.200 cm/s LVOT VTI:          0.068 m LVOT/AV VTI ratio: 0.47  AORTA Ao Root diam: 2.40 cm MITRAL VALVE                TRICUSPID VALVE MV Area (PHT): 10.25 cm    TR Peak grad:   49.8 mmHg MV Decel Time: 74 msec      TR Vmax:        353.00 cm/s MV E velocity: 62.60 cm/s MV A velocity: 105.00 cm/s  SHUNTS MV E/A ratio:  0.60         Systemic VTI:  0.07 m                             Systemic Diam: 2.10 cm Neoma Laming MD Electronically signed by Neoma Laming MD Signature Date/Time: 06/22/2019/8:52:48 PM    Final     Time coordinating discharge: Over 30 minutes  SIGNED:   [place]ford Shi, MD  [facility] 06/24/2019, 3:41 PM

## 2019-06-24 NOTE — Progress Notes (Signed)
SUBJECTIVE: Patient resting comfortably in chair.  Patient states that her breathing has improved and continues to deny chest pain  Patient states her legs continue to be swollen and tender.    Vitals:   06/24/19 0201 06/24/19 0631 06/24/19 0836 06/24/19 0947  BP:  (!) 135/96 (!) 139/98   Pulse:  80 75 88  Resp:  (!) 24 (!) 22   Temp:  97.8 F (36.6 C) (!) 97.5 F (36.4 C)   TempSrc:  Oral Oral   SpO2:  100% 100%   Weight: 106.8 kg     Height:        Intake/Output Summary (Last 24 hours) at 06/24/2019 1149 Last data filed at 06/24/2019 0951 Gross per 24 hour  Intake 840 ml  Output 0 ml  Net 840 ml    LABS: Basic Metabolic Panel: Recent Labs    06/23/19 1647 06/24/19 0620  NA 140 141  K 4.0 3.5  CL 104 102  CO2 28 27  GLUCOSE 127* 115*  BUN 40* 39*  CREATININE 1.88* 1.82*  CALCIUM 8.8* 8.8*  MG 2.0  --   PHOS 3.0  --    Liver Function Tests: No results for input(s): AST, ALT, ALKPHOS, BILITOT, PROT, ALBUMIN in the last 72 hours. No results for input(s): LIPASE, AMYLASE in the last 72 hours. CBC: Recent Labs    06/24/19 0620  WBC 3.8*  HGB 9.3*  HCT 29.6*  MCV 71.5*  PLT 241   Cardiac Enzymes: No results for input(s): CKTOTAL, CKMB, CKMBINDEX, TROPONINI in the last 72 hours. BNP: Invalid input(s): POCBNP D-Dimer: No results for input(s): DDIMER in the last 72 hours. Hemoglobin A1C: No results for input(s): HGBA1C in the last 72 hours. Fasting Lipid Panel: No results for input(s): CHOL, HDL, LDLCALC, TRIG, CHOLHDL, LDLDIRECT in the last 72 hours. Thyroid Function Tests: No results for input(s): TSH, T4TOTAL, T3FREE, THYROIDAB in the last 72 hours.  Invalid input(s): FREET3 Anemia Panel: No results for input(s): VITAMINB12, FOLATE, FERRITIN, TIBC, IRON, RETICCTPCT in the last 72 hours.   PHYSICAL EXAM General: Well developed, well nourished, in no acute distress HEENT:  Normocephalic and atramatic Neck:  No JVD.  Lungs: Clear bilaterally to  auscultation and percussion. Heart: HRRR . Normal S1 and S2 without gallops or murmurs.  Abdomen: Bowel sounds are positive, abdomen soft and non-tender  Msk:  Back normal, normal gait. Normal strength and tone for age. Extremities: No clubbing, cyanosis. +1 edema.   Neuro: Alert and oriented X 3. Psych:  Good affect, responds appropriately  TELEMETRY: NSR 70s  ASSESSMENT AND PLAN: Patient presenting to the emergency department with acute on chronic combined HFpEF/HFrEF.  Echocardiogram revealed LVEF 25-30%, mild LVH with grade 3 diastolic dysfunction, severely enlarged RV, and moderately elevated PA pressure.  We will continue to optimize patient's heart failure medication by continuing spironolactone, carvedilol and Entresto at its current doses.  Edema has improved after increasing bumex to 2mg  IV, transition to oral bumex 1mg  bid. From a cardiac point of view, patient is safe for discharge. Please ensure patient sees [name] as an outpatient next week. I know this may be difficult as she is homeless, but I reiterated to her the importance of her heart failure management.  Principal Problem:   Acute on chronic combined systolic and diastolic CHF (congestive heart failure) (HCC) Active Problems:   Dilated cardiomyopathy (HCC)   Elevated troponin   CKD (chronic kidney disease), stage III   Acute on chronic combined systolic (congestive) and  diastolic (congestive) heart failure (HCC)   History of anemia due to chronic kidney disease    Adaline Sill, NP-C 06/24/2019 11:49 AM

## 2019-06-24 NOTE — Progress Notes (Signed)
RN requested a supportive visit as Clotine was agitated. Upon arrival Ryllie was reporting swelling in her knees and feeling a cold draft. She was intially suspicious of chaplain.  Chaplain sat down with [name] and affirmed her words, offered words of assurance, validated her worth, and offered comfort touch. Atvain was provided despite Roshanna's inital refusal. When chaplain left room, Kenetra was lying in bed with feet elevated eyes closed.     06/24/19 0300  Clinical Encounter Type  Visited With Patient  Visit Type Follow-up  Referral From Nurse  Consult/Referral To Chaplain

## 2019-06-29 ENCOUNTER — Ambulatory Visit: Payer: Medicaid Other | Admitting: Gastroenterology

## 2019-06-29 ENCOUNTER — Telehealth (HOSPITAL_COMMUNITY): Payer: Self-pay

## 2019-06-29 NOTE — Telephone Encounter (Signed)
Attempted to contact, it says a message that my number is restricted from calling this number.  Will continue to try to contact.   [place] [phone]

## 2019-07-14 NOTE — Progress Notes (Deleted)
Patient ID: Katelyn Lane, female    DOB: 11/09/1955, 64 y.o.   MRN: [id number]  HPI  Ms Attwood is a 64 y/o female with a history of HTN, CKD, hyperlipidemia, current tobacco/cocaine use and chronic heart failure.   Echo report from 06/22/19 reviewed and showed an EF of 25-30% along with moderately elevated PA pressure. Echo report from 04/29/19 reviewed and showed an EF of <20% along with moderate MR and severe TR.   Catheterization done 12/30/2018 and showed:  Mid RCA lesion is 10% stenosed.  Prox Cx lesion is 30% stenosed.  Hemodynamic findings consistent with moderate pulmonary hypertension.   LV angiogram showing severe dilated cardiomyopathy with ejection fraction of 15% Coronary angiogram showing minimal atherosclerosis of left circumflex and right coronary Significant elevation of left end-diastolic pressures, pulmonary pressures, pulmonary capillary wedge pressures  Admitted 06/19/19 due to acute on chronic HF. Cardiology consult obtained. Initially given IV lasix with transition to oral diuretics. Elevated troponin thought to be due to demand ischemia. Asymptomatic 8 beat run of NSVT. Tested + for cocaine. Electrolytes stable. Discharged after 5 days.   She presents today for a follow-up visit with a chief complaint of   Past Medical History:  Diagnosis Date  . CHF (congestive heart failure) ([facility])   . COVID-19   . Hyperlipidemia   . Hypertension   . Renal disorder    Past Surgical History:  Procedure Laterality Date  . RIGHT/LEFT HEART CATH AND CORONARY ANGIOGRAPHY N/A 12/30/2018   Procedure: RIGHT/LEFT HEART CATH AND CORONARY ANGIOGRAPHY;  Surgeon: Corey Skains, MD;  Location: [facility];  Service: Cardiovascular;  Laterality: N/A;   Family History  Problem Relation Age of Onset  . Breast cancer Neg Hx    Social History   Tobacco Use  . Smoking status: Current Every Day Smoker  . Smokeless tobacco: Never Used  Substance Use Topics  . Alcohol  use: No    Alcohol/week: 0.0 standard drinks   Allergies  Allergen Reactions  . Penicillins Hives, Rash and Other (See Comments)    Did it involve swelling of the face/tongue/throat, SOB, or low BP? No Did it involve sudden or severe rash/hives, skin peeling, or any reaction on the inside of your mouth or nose? Yes Did you need to seek medical attention at a hospital or doctor's office? Yes When did it last happen? >25 years If all above answers are "NO", may proceed with cephalosporin use.   . Ace Inhibitors Nausea And Vomiting     Review of Systems  Constitutional: Positive for fatigue. Negative for appetite change.  HENT: Negative for congestion, postnasal drip and sore throat.   Eyes: Negative.   Respiratory: Positive for cough, chest tightness and shortness of breath.   Cardiovascular: Positive for chest pain and leg swelling. Negative for palpitations.  Gastrointestinal: Negative for abdominal distention and abdominal pain.  Endocrine: Negative.   Genitourinary: Negative.   Musculoskeletal: Positive for arthralgias (left leg). Negative for back pain.  Skin: Negative.   Allergic/Immunologic: Negative.   Neurological: Negative for dizziness and light-headedness.  Hematological: Negative for adenopathy. Does not bruise/bleed easily.  Psychiatric/Behavioral: Positive for sleep disturbance (not sleeping well due to pain). Negative for dysphoric mood. The patient is nervous/anxious.       Physical Exam Vitals and nursing note reviewed.  Constitutional:      Appearance: Normal appearance. She is well-developed.  HENT:     Head: Normocephalic and atraumatic.  Cardiovascular:     Rate and  Rhythm: Regular rhythm. Tachycardia present.  Pulmonary:     Effort: Tachypnea present.     Breath sounds: Examination of the right-lower field reveals rhonchi. Examination of the left-lower field reveals rhonchi. Rhonchi present. No wheezing or rales.     Comments: Lungs do clear  some after coughing Abdominal:     Palpations: Abdomen is soft.     Tenderness: There is no abdominal tenderness.  Musculoskeletal:     Cervical back: Normal range of motion and neck supple.     Right lower leg: Tenderness present. Edema (2+ pitting) present.     Left lower leg: Tenderness present. Edema (2+ pitting) present.  Skin:    General: Skin is warm and dry.  Neurological:     Mental Status: She is lethargic.     Comments: Slurring of words Falling asleep while talking  Psychiatric:        Mood and Affect: Mood is anxious.        Behavior: Behavior is agitated.      Assessment & Plan:  1: Chronic heart failure with reduced ejection fraction- - NYHA class  - paramedic has made contact with patient  - BNP on 06/19/19 was >4500.0 - paramedic unable to reach patient  2: HTN- - BP  - BMP 06/24/19 reviewed and showed sodium 141, potassium 3.5, creatinine 1.82 and GFR 34  3: Tobacco use- - smoking 1/2 ppd of cigarettes - + cocaine on 06/23/19

## 2019-07-15 ENCOUNTER — Ambulatory Visit: Payer: Medicaid Other | Admitting: Family

## 2019-07-15 ENCOUNTER — Telehealth: Payer: Self-pay | Admitting: Family

## 2019-07-15 NOTE — Telephone Encounter (Signed)
Patient did not show for her [facility] appointment on 07/15/19. Will attempt to reschedule.

## 2019-07-26 ENCOUNTER — Other Ambulatory Visit: Payer: Self-pay

## 2019-07-26 ENCOUNTER — Inpatient Hospital Stay
Admission: EM | Admit: 2019-07-26 | Discharge: 2019-09-02 | DRG: 291 | Disposition: A | Discharge status: Skilled Nursing Facility | Payer: Medicaid Other | Attending: Internal Medicine | Admitting: Internal Medicine

## 2019-07-26 ENCOUNTER — Emergency Department: Payer: Medicaid Other

## 2019-07-26 DIAGNOSIS — Z7951 Long term (current) use of inhaled steroids: Secondary | ICD-10-CM

## 2019-07-26 DIAGNOSIS — F329 Major depressive disorder, single episode, unspecified: Secondary | ICD-10-CM | POA: Diagnosis present

## 2019-07-26 DIAGNOSIS — I428 Other cardiomyopathies: Secondary | ICD-10-CM | POA: Diagnosis present

## 2019-07-26 DIAGNOSIS — Z6841 Body Mass Index (BMI) 40.0 and over, adult: Secondary | ICD-10-CM

## 2019-07-26 DIAGNOSIS — Y95 Nosocomial condition: Secondary | ICD-10-CM | POA: Diagnosis not present

## 2019-07-26 DIAGNOSIS — J189 Pneumonia, unspecified organism: Secondary | ICD-10-CM | POA: Diagnosis not present

## 2019-07-26 DIAGNOSIS — Z7901 Long term (current) use of anticoagulants: Secondary | ICD-10-CM

## 2019-07-26 DIAGNOSIS — K7460 Unspecified cirrhosis of liver: Secondary | ICD-10-CM | POA: Diagnosis present

## 2019-07-26 DIAGNOSIS — F1410 Cocaine abuse, uncomplicated: Secondary | ICD-10-CM | POA: Diagnosis present

## 2019-07-26 DIAGNOSIS — T502X5A Adverse effect of carbonic-anhydrase inhibitors, benzothiadiazides and other diuretics, initial encounter: Secondary | ICD-10-CM | POA: Diagnosis not present

## 2019-07-26 DIAGNOSIS — R52 Pain, unspecified: Secondary | ICD-10-CM

## 2019-07-26 DIAGNOSIS — D631 Anemia in chronic kidney disease: Secondary | ICD-10-CM | POA: Diagnosis present

## 2019-07-26 DIAGNOSIS — I509 Heart failure, unspecified: Secondary | ICD-10-CM

## 2019-07-26 DIAGNOSIS — K5900 Constipation, unspecified: Secondary | ICD-10-CM | POA: Diagnosis not present

## 2019-07-26 DIAGNOSIS — F17200 Nicotine dependence, unspecified, uncomplicated: Secondary | ICD-10-CM | POA: Diagnosis present

## 2019-07-26 DIAGNOSIS — R0602 Shortness of breath: Secondary | ICD-10-CM

## 2019-07-26 DIAGNOSIS — Z590 Homelessness: Secondary | ICD-10-CM

## 2019-07-26 DIAGNOSIS — I472 Ventricular tachycardia: Secondary | ICD-10-CM | POA: Diagnosis not present

## 2019-07-26 DIAGNOSIS — F919 Conduct disorder, unspecified: Secondary | ICD-10-CM | POA: Diagnosis not present

## 2019-07-26 DIAGNOSIS — E785 Hyperlipidemia, unspecified: Secondary | ICD-10-CM | POA: Diagnosis present

## 2019-07-26 DIAGNOSIS — Z862 Personal history of diseases of the blood and blood-forming organs and certain disorders involving the immune mechanism: Secondary | ICD-10-CM

## 2019-07-26 DIAGNOSIS — I5043 Acute on chronic combined systolic (congestive) and diastolic (congestive) heart failure: Secondary | ICD-10-CM | POA: Diagnosis present

## 2019-07-26 DIAGNOSIS — Z8616 Personal history of COVID-19: Secondary | ICD-10-CM

## 2019-07-26 DIAGNOSIS — Z9114 Patient's other noncompliance with medication regimen: Secondary | ICD-10-CM

## 2019-07-26 DIAGNOSIS — D509 Iron deficiency anemia, unspecified: Secondary | ICD-10-CM | POA: Diagnosis present

## 2019-07-26 DIAGNOSIS — G9341 Metabolic encephalopathy: Secondary | ICD-10-CM | POA: Diagnosis present

## 2019-07-26 DIAGNOSIS — F32A Depression, unspecified: Secondary | ICD-10-CM | POA: Diagnosis present

## 2019-07-26 DIAGNOSIS — Z9119 Patient's noncompliance with other medical treatment and regimen: Secondary | ICD-10-CM

## 2019-07-26 DIAGNOSIS — Z7982 Long term (current) use of aspirin: Secondary | ICD-10-CM

## 2019-07-26 DIAGNOSIS — I130 Hypertensive heart and chronic kidney disease with heart failure and stage 1 through stage 4 chronic kidney disease, or unspecified chronic kidney disease: Principal | ICD-10-CM | POA: Diagnosis present

## 2019-07-26 DIAGNOSIS — J440 Chronic obstructive pulmonary disease with acute lower respiratory infection: Secondary | ICD-10-CM | POA: Diagnosis present

## 2019-07-26 DIAGNOSIS — J9621 Acute and chronic respiratory failure with hypoxia: Secondary | ICD-10-CM | POA: Diagnosis present

## 2019-07-26 DIAGNOSIS — J449 Chronic obstructive pulmonary disease, unspecified: Secondary | ICD-10-CM | POA: Diagnosis present

## 2019-07-26 DIAGNOSIS — F1911 Other psychoactive substance abuse, in remission: Secondary | ICD-10-CM | POA: Diagnosis present

## 2019-07-26 DIAGNOSIS — Z90710 Acquired absence of both cervix and uterus: Secondary | ICD-10-CM

## 2019-07-26 DIAGNOSIS — N179 Acute kidney failure, unspecified: Secondary | ICD-10-CM | POA: Diagnosis present

## 2019-07-26 DIAGNOSIS — F419 Anxiety disorder, unspecified: Secondary | ICD-10-CM | POA: Diagnosis present

## 2019-07-26 DIAGNOSIS — N1832 Chronic kidney disease, stage 3b: Secondary | ICD-10-CM | POA: Diagnosis present

## 2019-07-26 DIAGNOSIS — E669 Obesity, unspecified: Secondary | ICD-10-CM | POA: Diagnosis present

## 2019-07-26 DIAGNOSIS — M79671 Pain in right foot: Secondary | ICD-10-CM

## 2019-07-26 DIAGNOSIS — G629 Polyneuropathy, unspecified: Secondary | ICD-10-CM | POA: Diagnosis present

## 2019-07-26 DIAGNOSIS — E876 Hypokalemia: Secondary | ICD-10-CM | POA: Diagnosis not present

## 2019-07-26 DIAGNOSIS — I248 Other forms of acute ischemic heart disease: Secondary | ICD-10-CM | POA: Diagnosis present

## 2019-07-26 DIAGNOSIS — I959 Hypotension, unspecified: Secondary | ICD-10-CM | POA: Diagnosis not present

## 2019-07-26 DIAGNOSIS — M109 Gout, unspecified: Secondary | ICD-10-CM | POA: Diagnosis present

## 2019-07-26 DIAGNOSIS — I272 Pulmonary hypertension, unspecified: Secondary | ICD-10-CM | POA: Diagnosis present

## 2019-07-26 DIAGNOSIS — Z79899 Other long term (current) drug therapy: Secondary | ICD-10-CM

## 2019-07-26 LAB — CBC
HCT: 34.2 % — ABNORMAL LOW (ref 36.0–46.0)
Hemoglobin: 9.6 g/dL — ABNORMAL LOW (ref 12.0–15.0)
MCH: 21.4 pg — ABNORMAL LOW (ref 26.0–34.0)
MCHC: 28.1 g/dL — ABNORMAL LOW (ref 30.0–36.0)
MCV: 76.2 fL — ABNORMAL LOW (ref 80.0–100.0)
Platelets: 177 10*3/uL (ref 150–400)
RBC: 4.49 MIL/uL (ref 3.87–5.11)
RDW: 22 % — ABNORMAL HIGH (ref 11.5–15.5)
WBC: 5 10*3/uL (ref 4.0–10.5)
nRBC: 0.4 % — ABNORMAL HIGH (ref 0.0–0.2)

## 2019-07-26 LAB — BASIC METABOLIC PANEL
Anion gap: 11 (ref 5–15)
BUN: 52 mg/dL — ABNORMAL HIGH (ref 8–23)
CO2: 17 mmol/L — ABNORMAL LOW (ref 22–32)
Calcium: 8.7 mg/dL — ABNORMAL LOW (ref 8.9–10.3)
Chloride: 111 mmol/L (ref 98–111)
Creatinine, Ser: 2.39 mg/dL — ABNORMAL HIGH (ref 0.44–1.00)
GFR calc Af Amer: 24 mL/min — ABNORMAL LOW (ref >60)
GFR calc non Af Amer: 21 mL/min — ABNORMAL LOW (ref >60)
Glucose, Bld: 75 mg/dL (ref 70–99)
Potassium: 4.5 mmol/L (ref 3.5–5.1)
Sodium: 139 mmol/L (ref 135–145)

## 2019-07-26 LAB — BRAIN NATRIURETIC PEPTIDE: B Natriuretic Peptide: >4500 pg/mL — ABNORMAL HIGH (ref 0.0–100.0)

## 2019-07-26 MED ORDER — SODIUM CHLORIDE 0.9% FLUSH: 3.0000 mL | Freq: Once | INTRAVENOUS | Status: DC

## 2019-07-26 MED ORDER — FUROSEMIDE 10 MG/ML IJ SOLN
40.0000 mg | Freq: Once | INTRAMUSCULAR | Status: AC
  Administered 2019-07-26: 40 mg via INTRAVENOUS
  Filled 2019-07-26: qty 4

## 2019-07-26 NOTE — ED Notes (Signed)
Katelyn Lane, ed tech in to attempt to reason with pt regarding restroom and voiding status and fall risk.

## 2019-07-26 NOTE — ED Notes (Signed)
Sister to waiting room to check on patient.  Reports that patient is homeless at this time and feels that patient is not making good decision.  Would like for patient to have a social work consult.

## 2019-07-26 NOTE — ED Notes (Signed)
Pt will not leave pox in place or blood pressure cuff in place.

## 2019-07-26 NOTE — ED Notes (Signed)
Pt demanding to use the bathroom prior to getting triaged, pt assisted to the bathroom via ems

## 2019-07-26 NOTE — ED Notes (Signed)
Attempt iv insertion x1 without success. Pt offered bedpan and external urinary catheter and declines. Pt informed she is a fall risk and this rn would prefer if pt did not get up to use restroom. Pt declines to follow instructions. Pt states "if I fall, I fall".

## 2019-07-26 NOTE — ED Notes (Signed)
Katelyn Lane, ed tech in room speaking with pt regarding need to have cardiac monitor in place and pure wick in place. Pt got up to restroom on own, but they was too shob to make it back to bed. Pt assisted back to bed by staff. md in room.

## 2019-07-26 NOTE — ED Notes (Signed)
Pt refused lab to venipuncture.

## 2019-07-26 NOTE — ED Notes (Signed)
Lab here for repeat labs.

## 2019-07-26 NOTE — ED Triage Notes (Addendum)
Pt comes via [facility] from home with c/o LOC. Pt states she didn't pass out but is here for edema in her legs.  EMS reports VSS and that pt has hx of drug abuse per family. EMS also states pt was refusing transporting and then decided to come  Pt states last cocaine use was 3-4 days ago. Pt falling asleep in triage.

## 2019-07-26 NOTE — ED Notes (Signed)
Iv team here 

## 2019-07-27 ENCOUNTER — Inpatient Hospital Stay: Payer: Medicaid Other

## 2019-07-27 DIAGNOSIS — F1410 Cocaine abuse, uncomplicated: Secondary | ICD-10-CM | POA: Diagnosis present

## 2019-07-27 DIAGNOSIS — Y95 Nosocomial condition: Secondary | ICD-10-CM | POA: Diagnosis not present

## 2019-07-27 DIAGNOSIS — N1832 Chronic kidney disease, stage 3b: Secondary | ICD-10-CM | POA: Diagnosis present

## 2019-07-27 DIAGNOSIS — J189 Pneumonia, unspecified organism: Secondary | ICD-10-CM | POA: Diagnosis not present

## 2019-07-27 DIAGNOSIS — F320 Major depressive disorder, single episode, mild: Secondary | ICD-10-CM | POA: Diagnosis not present

## 2019-07-27 DIAGNOSIS — Z7951 Long term (current) use of inhaled steroids: Secondary | ICD-10-CM | POA: Diagnosis not present

## 2019-07-27 DIAGNOSIS — R0602 Shortness of breath: Secondary | ICD-10-CM | POA: Diagnosis not present

## 2019-07-27 DIAGNOSIS — I10 Essential (primary) hypertension: Secondary | ICD-10-CM | POA: Diagnosis not present

## 2019-07-27 DIAGNOSIS — Z7901 Long term (current) use of anticoagulants: Secondary | ICD-10-CM | POA: Diagnosis not present

## 2019-07-27 DIAGNOSIS — Z6841 Body Mass Index (BMI) 40.0 and over, adult: Secondary | ICD-10-CM | POA: Diagnosis not present

## 2019-07-27 DIAGNOSIS — Z590 Homelessness: Secondary | ICD-10-CM | POA: Diagnosis not present

## 2019-07-27 DIAGNOSIS — M109 Gout, unspecified: Secondary | ICD-10-CM | POA: Diagnosis present

## 2019-07-27 DIAGNOSIS — J449 Chronic obstructive pulmonary disease, unspecified: Secondary | ICD-10-CM | POA: Diagnosis not present

## 2019-07-27 DIAGNOSIS — I272 Pulmonary hypertension, unspecified: Secondary | ICD-10-CM | POA: Diagnosis not present

## 2019-07-27 DIAGNOSIS — I5041 Acute combined systolic (congestive) and diastolic (congestive) heart failure: Secondary | ICD-10-CM

## 2019-07-27 DIAGNOSIS — I130 Hypertensive heart and chronic kidney disease with heart failure and stage 1 through stage 4 chronic kidney disease, or unspecified chronic kidney disease: Secondary | ICD-10-CM | POA: Diagnosis present

## 2019-07-27 DIAGNOSIS — Z7982 Long term (current) use of aspirin: Secondary | ICD-10-CM | POA: Diagnosis not present

## 2019-07-27 DIAGNOSIS — Z862 Personal history of diseases of the blood and blood-forming organs and certain disorders involving the immune mechanism: Secondary | ICD-10-CM | POA: Diagnosis not present

## 2019-07-27 DIAGNOSIS — J440 Chronic obstructive pulmonary disease with acute lower respiratory infection: Secondary | ICD-10-CM | POA: Diagnosis present

## 2019-07-27 DIAGNOSIS — F17200 Nicotine dependence, unspecified, uncomplicated: Secondary | ICD-10-CM | POA: Diagnosis present

## 2019-07-27 DIAGNOSIS — I248 Other forms of acute ischemic heart disease: Secondary | ICD-10-CM | POA: Diagnosis present

## 2019-07-27 DIAGNOSIS — E785 Hyperlipidemia, unspecified: Secondary | ICD-10-CM

## 2019-07-27 DIAGNOSIS — R9431 Abnormal electrocardiogram [ECG] [EKG]: Secondary | ICD-10-CM | POA: Diagnosis not present

## 2019-07-27 DIAGNOSIS — Z79899 Other long term (current) drug therapy: Secondary | ICD-10-CM | POA: Diagnosis not present

## 2019-07-27 DIAGNOSIS — J9621 Acute and chronic respiratory failure with hypoxia: Secondary | ICD-10-CM | POA: Diagnosis present

## 2019-07-27 DIAGNOSIS — N179 Acute kidney failure, unspecified: Secondary | ICD-10-CM | POA: Diagnosis present

## 2019-07-27 DIAGNOSIS — G9341 Metabolic encephalopathy: Secondary | ICD-10-CM | POA: Diagnosis present

## 2019-07-27 DIAGNOSIS — F329 Major depressive disorder, single episode, unspecified: Secondary | ICD-10-CM | POA: Diagnosis present

## 2019-07-27 DIAGNOSIS — D638 Anemia in other chronic diseases classified elsewhere: Secondary | ICD-10-CM | POA: Diagnosis not present

## 2019-07-27 DIAGNOSIS — Z8616 Personal history of COVID-19: Secondary | ICD-10-CM | POA: Diagnosis not present

## 2019-07-27 DIAGNOSIS — Z9114 Patient's other noncompliance with medication regimen: Secondary | ICD-10-CM | POA: Diagnosis not present

## 2019-07-27 DIAGNOSIS — I472 Ventricular tachycardia: Secondary | ICD-10-CM | POA: Diagnosis not present

## 2019-07-27 DIAGNOSIS — I5043 Acute on chronic combined systolic (congestive) and diastolic (congestive) heart failure: Secondary | ICD-10-CM

## 2019-07-27 DIAGNOSIS — N189 Chronic kidney disease, unspecified: Secondary | ICD-10-CM

## 2019-07-27 DIAGNOSIS — Z9119 Patient's noncompliance with other medical treatment and regimen: Secondary | ICD-10-CM | POA: Diagnosis not present

## 2019-07-27 LAB — URINALYSIS, COMPLETE (UACMP) WITH MICROSCOPIC
Bacteria, UA: NONE SEEN
Bilirubin Urine: NEGATIVE
Glucose, UA: NEGATIVE mg/dL
Hgb urine dipstick: NEGATIVE
Ketones, ur: NEGATIVE mg/dL
Leukocytes,Ua: NEGATIVE
Nitrite: NEGATIVE
Protein, ur: NEGATIVE mg/dL
Specific Gravity, Urine: 1.009 (ref 1.005–1.030)
pH: 6 (ref 5.0–8.0)

## 2019-07-27 LAB — TROPONIN I (HIGH SENSITIVITY): Troponin I (High Sensitivity): 158 ng/L (ref <18)

## 2019-07-27 LAB — HEPATIC FUNCTION PANEL
ALT: 17 U/L (ref 0–44)
AST: 24 U/L (ref 15–41)
Albumin: 2.7 g/dL — ABNORMAL LOW (ref 3.5–5.0)
Alkaline Phosphatase: 184 U/L — ABNORMAL HIGH (ref 38–126)
Bilirubin, Direct: 0.4 mg/dL — ABNORMAL HIGH (ref 0.0–0.2)
Indirect Bilirubin: 0.7 mg/dL (ref 0.3–0.9)
Total Bilirubin: 1.1 mg/dL (ref 0.3–1.2)
Total Protein: 6.1 g/dL — ABNORMAL LOW (ref 6.5–8.1)

## 2019-07-27 LAB — T4, FREE: Free T4: 0.97 ng/dL (ref 0.61–1.12)

## 2019-07-27 LAB — TSH: TSH: 9.549 u[IU]/mL — ABNORMAL HIGH (ref 0.350–4.500)

## 2019-07-27 MED ORDER — SODIUM CHLORIDE 0.9 % IV SOLN
250.0000 mL | INTRAVENOUS | Status: DC | PRN
  Administered 2019-08-06 – 2019-08-09 (×2): 250 mL via INTRAVENOUS

## 2019-07-27 MED ORDER — CARVEDILOL 6.25 MG PO TABS
3.1250 mg | ORAL_TABLET | Freq: Two times a day (BID) | ORAL | Status: DC
  Administered 2019-07-27 – 2019-09-02 (×65): 3.125 mg via ORAL
  Filled 2019-07-27 (×71): qty 1

## 2019-07-27 MED ORDER — RIVAROXABAN 20 MG PO TABS
20.0000 mg | ORAL_TABLET | Freq: Every day | ORAL | Status: DC
  Administered 2019-07-27 – 2019-09-01 (×37): 20 mg via ORAL
  Filled 2019-07-27 (×38): qty 1

## 2019-07-27 MED ORDER — SODIUM CHLORIDE 0.9% FLUSH
3.0000 mL | Freq: Two times a day (BID) | INTRAVENOUS | Status: DC
  Administered 2019-07-27 – 2019-08-26 (×41): 3 mL via INTRAVENOUS

## 2019-07-27 MED ORDER — ENOXAPARIN SODIUM 30 MG/0.3ML ~~LOC~~ SOLN
30.0000 mg | SUBCUTANEOUS | Status: DC
  Filled 2019-07-27: qty 0.3

## 2019-07-27 MED ORDER — ZOLPIDEM TARTRATE 5 MG PO TABS: 5.0000 mg | ORAL_TABLET | Freq: Every evening | ORAL | Status: DC | PRN

## 2019-07-27 MED ORDER — ATORVASTATIN CALCIUM 20 MG PO TABS
20.0000 mg | ORAL_TABLET | Freq: Every day | ORAL | Status: DC
  Administered 2019-07-27 – 2019-09-02 (×37): 20 mg via ORAL
  Filled 2019-07-27 (×36): qty 1

## 2019-07-27 MED ORDER — ASPIRIN EC 81 MG PO TBEC
81.0000 mg | DELAYED_RELEASE_TABLET | Freq: Every day | ORAL | Status: DC
  Administered 2019-07-27 – 2019-09-02 (×37): 81 mg via ORAL
  Filled 2019-07-27 (×35): qty 1

## 2019-07-27 MED ORDER — TRAMADOL HCL 50 MG PO TABS
50.0000 mg | ORAL_TABLET | Freq: Four times a day (QID) | ORAL | Status: DC | PRN
  Administered 2019-07-27 – 2019-08-07 (×18): 50 mg via ORAL
  Filled 2019-07-27 (×18): qty 1

## 2019-07-27 MED ORDER — FLUOXETINE HCL 20 MG PO CAPS
20.0000 mg | ORAL_CAPSULE | Freq: Every day | ORAL | Status: DC
  Administered 2019-07-27 – 2019-08-24 (×28): 20 mg via ORAL
  Filled 2019-07-27 (×30): qty 1

## 2019-07-27 MED ORDER — SACUBITRIL-VALSARTAN 49-51 MG PO TABS: 1.0000 | ORAL_TABLET | Freq: Two times a day (BID) | ORAL | Status: DC

## 2019-07-27 MED ORDER — ACETAMINOPHEN 325 MG PO TABS
650.0000 mg | ORAL_TABLET | ORAL | Status: DC | PRN
  Administered 2019-08-10 – 2019-09-02 (×9): 650 mg via ORAL
  Filled 2019-07-27 (×11): qty 2

## 2019-07-27 MED ORDER — SPIRONOLACTONE 25 MG PO TABS
25.0000 mg | ORAL_TABLET | Freq: Every day | ORAL | Status: DC
  Administered 2019-07-27 – 2019-07-29 (×3): 25 mg via ORAL
  Filled 2019-07-27 (×3): qty 1

## 2019-07-27 MED ORDER — ALPRAZOLAM 0.25 MG PO TABS
0.2500 mg | ORAL_TABLET | Freq: Two times a day (BID) | ORAL | Status: DC | PRN
  Administered 2019-07-29 – 2019-08-28 (×28): 0.25 mg via ORAL
  Filled 2019-07-27 (×30): qty 1

## 2019-07-27 MED ORDER — SODIUM CHLORIDE 0.9% FLUSH: 3.0000 mL | INTRAVENOUS | Status: DC | PRN

## 2019-07-27 MED ORDER — FUROSEMIDE 10 MG/ML IJ SOLN
40.0000 mg | Freq: Two times a day (BID) | INTRAMUSCULAR | Status: DC
  Administered 2019-07-27 – 2019-07-29 (×5): 40 mg via INTRAVENOUS
  Filled 2019-07-27 (×5): qty 4

## 2019-07-27 NOTE — H&P (Signed)
[facility] [place] NAME: Katelyn Lane    MR#:  [id number]  DATE OF BIRTH:  07-09-1955  DATE OF ADMISSION:  07/26/2019  PRIMARY CARE PHYSICIAN: Alene Mires Elyse Jarvis, MD   REQUESTING/REFERRING PHYSICIAN: Brenton Grills, MD  CHIEF COMPLAINT:   Chief Complaint  Patient presents with  . Loss of Consciousness  . Leg Swelling    HISTORY OF PRESENT ILLNESS:  Katelyn Lane  is a 64 y.o. African-American female with a known history of CHF, COVID-19, dyslipidemia, hypertension and stage IIIb chronic kidney disease, who presented to the emergency room with acute onset of worsening dyspnea with associated dry cough and wheezing as well as orthopnea and paroxysmal nocturnal dyspnea and lower extremity edema over the last week.  No chest pain or palpitations.  She admitted to missing doses of her Bumex sometimes, about 3 days a week.  She last smoked cocaine about 3 to 4 days ago.  She denied any fever or chills.  No nausea or vomiting or abdominal pain.  She was fairly somnolent but arousable arousable.  No dysuria, oliguria or hematuria or flank pain.  Upon presentation to the emergency room, vital signs were within normal initially.  Labs revealed BUN of 52 and creatinine 2.39 compared to 39 and 1.82 on 06/24/2019.  CBC showed anemia close to baseline.  BNP was more than 4500.  Portable chest x-ray showed cardiomegaly and interstitial pulmonary edema.  EKG showed normal sinus rhythm with rate of 70 with PACs, left axis deviation, low voltage QRS and Q waves anterolaterally and inferiorly.  The patient was given 40 mg of IV Lasix.  She will be admitted to a progressive unit bed for further evaluation and management.  PAST MEDICAL HISTORY:   Past Medical History:  Diagnosis Date  . CHF (congestive heart failure) ([facility])   . COVID-19   . Hyperlipidemia   . Hypertension   . Renal disorder     PAST SURGICAL HISTORY:   Past Surgical History:  Procedure  Laterality Date  . RIGHT/LEFT HEART CATH AND CORONARY ANGIOGRAPHY N/A 12/30/2018   Procedure: RIGHT/LEFT HEART CATH AND CORONARY ANGIOGRAPHY;  Surgeon: Corey Skains, MD;  Location: [facility];  Service: Cardiovascular;  Laterality: N/A;    SOCIAL HISTORY:   Social History   Tobacco Use  . Smoking status: Current Every Day Smoker  . Smokeless tobacco: Never Used  Substance Use Topics  . Alcohol use: No    Alcohol/week: 0.0 standard drinks    FAMILY HISTORY:   Family History  Problem Relation Age of Onset  . Breast cancer Neg Hx     DRUG ALLERGIES:   Allergies  Allergen Reactions  . Penicillins Hives, Rash and Other (See Comments)    Did it involve swelling of the face/tongue/throat, SOB, or low BP? No Did it involve sudden or severe rash/hives, skin peeling, or any reaction on the inside of your mouth or nose? Yes Did you need to seek medical attention at a hospital or doctor's office? Yes When did it last happen? >25 years If all above answers are "NO", may proceed with cephalosporin use.   . Ace Inhibitors Nausea And Vomiting    REVIEW OF SYSTEMS:   ROS As per history of present illness. All pertinent systems were reviewed above. Constitutional, HEENT, cardiovascular, respiratory, GI, GU, musculoskeletal, neuro, psychiatric, endocrine, integumentary and hematologic systems were reviewed and are otherwise negative/unremarkable except for positive findings mentioned above in the HPI.  MEDICATIONS AT HOME:   Prior to Admission medications   Medication Sig Start Date End Date Taking? Authorizing Provider  albuterol (VENTOLIN HFA) 108 (90 Base) MCG/ACT inhaler Inhale 2 puffs into the lungs every 6 (six) hours as needed for wheezing or shortness of breath.    [provider]  allopurinol (ZYLOPRIM) 100 MG tablet Take 1 tablet (100 mg total) by mouth daily. 06/06/19 07/06/19  Loletha Grayer, MD  ascorbic acid (VITAMIN C) 500 MG tablet Take  500 mg by mouth daily.    [provider]  aspirin 81 MG chewable tablet Chew 1 tablet (81 mg total) by mouth daily. 05/03/19   Charlynne Cousins, MD  atorvastatin (LIPITOR) 20 MG tablet Take 1 tablet (20 mg total) by mouth daily at 6 PM. 05/03/19 08/01/19  Charlynne Cousins, MD  bumetanide (BUMEX) 1 MG tablet Take 1 tablet (1 mg total) by mouth 2 (two) times daily. May take additional dose for worsening leg swellings/shortness of breath 06/24/19 09/22/19  Guilford Shi, MD  carvedilol (COREG) 6.25 MG tablet Take 0.5 tablets (3.125 mg total) by mouth 2 (two) times daily with a meal. 06/24/19 09/22/19  Guilford Shi, MD  docusate calcium (SURFAK) 240 MG capsule Take 240 mg by mouth daily as needed for moderate constipation.     [provider]  FLUoxetine (PROZAC) 20 MG capsule Take 1 capsule (20 mg total) by mouth daily. 05/03/19   Charlynne Cousins, MD  Ipratropium-Albuterol (COMBIVENT) 20-100 MCG/ACT AERS respimat Inhale 1 puff into the lungs every 6 (six) hours. 05/26/19   Loletha Grayer, MD  loratadine (CLARITIN) 10 MG tablet Take 10 mg by mouth daily as needed for allergies.     [provider]  nitroGLYCERIN (NITROSTAT) 0.4 MG SL tablet Place 1 tablet (0.4 mg total) under the tongue every 5 (five) minutes as needed for chest pain. 05/03/19   Charlynne Cousins, MD  pantoprazole (PROTONIX) 40 MG tablet Take 1 tablet (40 mg total) by mouth daily. 05/03/19   Charlynne Cousins, MD  rivaroxaban (XARELTO) 20 MG TABS tablet Take 1 tablet (20 mg total) by mouth daily with supper. For the blood clot in your heart. 05/03/19 08/01/19  Charlynne Cousins, MD  sacubitril-valsartan (ENTRESTO) 49-51 MG Take 1 tablet by mouth 2 (two) times daily. 05/26/19   Loletha Grayer, MD  spironolactone (ALDACTONE) 25 MG tablet Take 1 tablet (25 mg total) by mouth daily. 06/25/19 08/24/19  Guilford Shi, MD      VITAL SIGNS:  Blood pressure 129/90, pulse (!) 48, temperature 98  F (36.7 C), temperature source Oral, resp. rate 17, height 5\' 7"  (1.702 m), weight 105.2 kg, SpO2 98 %.  PHYSICAL EXAMINATION:  Physical Exam  GENERAL:  64 y.o.-year-old African-American female patient lying in the bed with mild conversational dyspnea. EYES: Pupils equal, round, reactive to light and accommodation. No scleral icterus. Extraocular muscles intact.  HEENT: Head atraumatic, normocephalic. Oropharynx and nasopharynx clear.  NECK:  Supple, no jugular venous distention. No thyroid enlargement, no tenderness.  LUNGS: Slightly diminished bibasal breath sounds with bibasal rales. CARDIOVASCULAR: Regular rate and rhythm, S1, S2 normal. No murmurs, rubs, or gallops.  ABDOMEN: Soft, nondistended, nontender. Bowel sounds present. No organomegaly or mass.  EXTREMITIES: 1-2+ bilateral lower extremity pitting edema with no cyanosis, or clubbing.  NEUROLOGIC: Cranial nerves II through XII are intact. Muscle strength 5/5 in all extremities. Sensation intact. Gait not checked.  PSYCHIATRIC: The patient is alert and oriented x 3.  Normal affect  and good eye contact. SKIN: No obvious rash, lesion, or ulcer.   LABORATORY PANEL:   CBC Recent Labs  Lab 07/26/19 1631  WBC 5.0  HGB 9.6*  HCT 34.2*  PLT 177   ------------------------------------------------------------------------------------------------------------------  Chemistries  Recent Labs  Lab 07/26/19 1631  NA 139  K 4.5  CL 111  CO2 17*  GLUCOSE 75  BUN 52*  CREATININE 2.39*  CALCIUM 8.7*   ------------------------------------------------------------------------------------------------------------------  Cardiac Enzymes No results for input(s): TROPONINI in the last 168 hours. ------------------------------------------------------------------------------------------------------------------  RADIOLOGY:  DG Chest Portable 1 View  Result Date: 07/26/2019 CLINICAL DATA:  Dyspnea and leg edema EXAM: PORTABLE CHEST 1  VIEW COMPARISON:  None. FINDINGS: Again noted is marked cardiomegaly. Prominence of the central pulmonary vasculature with small peripheral interstitial markings are noted. No pleural effusion. No acute osseous abnormality. IMPRESSION: Cardiomegaly and interstitial edema Electronically Signed   By: Prudencio Pair M.D.   On: 07/26/2019 21:09      IMPRESSION AND PLAN:   1.  Acute on chronic systolic and diastolic CHF. -The patient had a 2D echo on 06/22/2019 that revealed an EF of 25 to 30% with grade 3 diastolic dysfunction, severe left atrial and right atrial dilatation as well as mild mitral regurgitation. -The patient will be admitted to a progressive unit bed. -She will be diuresed with IV Lasix. -We will continue Coreg, Aldactone and Entresto. -We will follow serial troponin I's. -We will obtain a cardiology consultation in a.m. -Dr. Burt Knack was notified about the patient.  2.  Acute kidney injury superimposed on stage IIIb chronic kidney disease. -This is likely prerenal secondary to acute CHF. -The patient will be diuresed with IV Lasix and will follow her BMP.  3.  Hypertension. -We will continue Coreg and Entresto.  4.  Dyslipidemia. -Statin therapy will be resumed.  5.  Gout. -We will continue allopurinol.  6.  DVT prophylaxis. -We will continue Xarelto.   All the records are reviewed and case discussed with ED provider. The plan of care was discussed in details with the patient (and family). I answered all questions. The patient agreed to proceed with the above mentioned plan. Further management will depend upon hospital course.   CODE STATUS: Full code  Status is: Inpatient  Remains inpatient appropriate because:Altered mental status, Ongoing diagnostic testing needed not appropriate for outpatient work up, Unsafe d/c plan, IV treatments appropriate due to intensity of illness or inability to take PO and Inpatient level of care appropriate due to severity of  illness   Dispo: The patient is from: Home              Anticipated d/c is to: Home              Anticipated d/c date is: 2 days              Patient currently is not medically stable to d/c.    TOTAL TIME TAKING CARE OF THIS PATIENT: 55 minutes.    Christel Mormon M.D on 07/27/2019 at 12:07 AM  [facility]   From 7 PM-7 AM, contact night-coverage [email or web address]  CC: Primary care physician; Revelo, Elyse Jarvis, MD   Note: This dictation was prepared with Dragon dictation along with smaller phrase technology. Any transcriptional typo errors that result from this process are unintentional.

## 2019-07-27 NOTE — ED Notes (Signed)
Pt assisted up to toilet. Minimal assistance needed at this time. Pt placed back into bed, and purewick placed back onto pt at this time due to administration of lasix.

## 2019-07-27 NOTE — ED Notes (Signed)
Pt demanding a "milk sandwich with peanut butter and jelly". Pt informed that we do not have that type of sandwich.

## 2019-07-27 NOTE — Progress Notes (Addendum)
PROGRESS NOTE    Katelyn Lane  YBO:[id number] DOB: 12/21/1955 DOA: 07/26/2019 PCP: [name], MD   Assessment & Plan:   Active Problems:   Acute CHF (congestive heart failure) (H[facility])  Acute on chronic combined CHF exacerbation: continue on IV lasix. Monitor I/Os. Continue on tele. Continue on coreg, aldactone. Hold entresto secondary to AKI. Echo on 06/22/19 showed EF 25-50%, grade III diastolic dysfunction. Cardio consulted  AKI on CKDIIIIb: likely secondary to CHF exacerbation. 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 No need for a transfusion at this time. Will continue to monitor   HTN: continue on coreg. Hold enteresto secondary to AKI   HLD: continue on statin  Depression: severity unknown. Continue on home dose of xarelto   DVT prophylaxis: xarelto  Code Status: full  Family Communication:  Disposition Plan: depends on PT/OT recs   Consultants:   Cardio, Dr. Nehemiah Massed     Procedures:   Antimicrobials:   Subjective: Pt c/o fatigue    Objective: Vitals:   07/27/19 0903 07/27/19 1011 07/27/19 1014 07/27/19 1210  BP: (!) 118/95 (!) 125/109 (!) 121/98 (!) 127/96  Pulse:  (!) 108  96  Resp: 15 19  19   Temp:  98.7 F (37.1 C)  98.5 F (36.9 C)  TempSrc:  Oral  Axillary  SpO2:  99%  98%  Weight:      Height:        Intake/Output Summary (Last 24 hours) at 07/27/2019 1423 Last data filed at 07/27/2019 0830 Gross per 24 hour  Intake --  Output 1600 ml  Net -1600 ml   Filed Weights   07/26/19 1623 07/27/19 0824  Weight: 105.2 kg 111.2 kg    Examination:  General exam: Appears calm and comfortable  Respiratory system: diminished breath sounds b/l/. No wheezes Cardiovascular system: S1 & S2 +. No  rubs, gallops or clicks. B/l LE edema  Gastrointestinal system: Abdomen is nondistended, soft and nontender.  Hypoactive bowel sounds heard. Central nervous system: Lethargic. Moves all 4 extremities  Psychiatry:  Judgement and insight appear normal.  Flat mood and affect.     Data Reviewed: I have personally reviewed following labs and imaging studies  CBC: Recent Labs  Lab 07/26/19 1631  WBC 5.0  HGB 9.6*  HCT 34.2*  MCV 76.2*  PLT 277   Basic Metabolic Panel: Recent Labs  Lab 07/26/19 1631  NA 139  K 4.5  CL 111  CO2 17*  GLUCOSE 75  BUN 52*  CREATININE 2.39*  CALCIUM 8.7*   GFR: Estimated Creatinine Clearance: 31 mL/min (A) (by C-G formula based on SCr of 2.39 mg/dL (H)). Liver Function Tests: Recent Labs  Lab 07/27/19 1039  AST 24  ALT 17  ALKPHOS 184*  BILITOT 1.1  PROT 6.1*  ALBUMIN 2.7*   No results for input(s): LIPASE, AMYLASE in the last 168 hours. No results for input(s): AMMONIA in the last 168 hours. Coagulation Profile: No results for input(s): INR, PROTIME in the last 168 hours. Cardiac Enzymes: No results for input(s): CKTOTAL, CKMB, CKMBINDEX, TROPONINI in the last 168 hours. BNP (last 3 results) No results for input(s): PROBNP in the last 8760 hours. HbA1C: No results for input(s): HGBA1C in the last 72 hours. CBG: No results for input(s): GLUCAP in the last 168 hours. Lipid Profile: No results for input(s): CHOL, HDL, LDLCALC, TRIG, CHOLHDL, LDLDIRECT in the last 72 hours. Thyroid Function Tests: Recent Labs    07/27/19 1039  TSH  9.549*  FREET4 0.97   Anemia Panel: No results for input(s): VITAMINB12, FOLATE, FERRITIN, TIBC, IRON, RETICCTPCT in the last 72 hours. Sepsis Labs: No results for input(s): PROCALCITON, LATICACIDVEN in the last 168 hours.  No results found for this or any previous visit (from the past 240 hour(s)).       Radiology Studies: DG Chest Portable 1 View  Result Date: 07/26/2019 CLINICAL DATA:  Dyspnea and leg edema EXAM: PORTABLE CHEST 1 VIEW COMPARISON:  None. FINDINGS: Again noted is marked cardiomegaly. Prominence of the central pulmonary vasculature with small peripheral interstitial markings are noted. No  pleural effusion. No acute osseous abnormality. IMPRESSION: Cardiomegaly and interstitial edema Electronically Signed   By: Prudencio Pair M.D.   On: 07/26/2019 21:09        Scheduled Meds: . aspirin EC  81 mg Oral Daily  . atorvastatin  20 mg Oral Daily  . carvedilol  3.125 mg Oral BID WC  . FLUoxetine  20 mg Oral Daily  . furosemide  40 mg Intravenous Q12H  . rivaroxaban  20 mg Oral Q supper  . sodium chloride flush  3 mL Intravenous Once  . sodium chloride flush  3 mL Intravenous Q12H  . spironolactone  25 mg Oral Daily   Continuous Infusions: . sodium chloride       LOS: 0 days    Time spent: 30 mins     [name], MD [facility] Pager [phone]  If 7PM-7AM, please contact night-coverage [email or web address]  07/27/2019, 2:23 PM

## 2019-07-27 NOTE — ED Notes (Signed)
Convinced pt to have blood pressure taken but will not leave pox in place. Call bell at left side. Bed in low and locked position. Pure wick remains in place.

## 2019-07-27 NOTE — ED Notes (Signed)
Pt given breakfast at this time

## 2019-07-27 NOTE — ED Notes (Signed)
Waiting on entresto from pharmacy. Pt is sleeping at this time, call bell at left side. resps unlabored.

## 2019-07-27 NOTE — ED Notes (Signed)
Dr. Joni Fears notified of pt refusing anymore venipunctures and inablitity to draw back blood from iv.

## 2019-07-27 NOTE — ED Notes (Signed)
Pt provided with sandwich tray and milk. Pt thanking staff.

## 2019-07-27 NOTE — ED Provider Notes (Signed)
[facility] Emergency Department Provider Note  ____________________________________________  Time seen: Approximately 12:26 AM  I have reviewed the triage vital signs and the nursing notes.   HISTORY  Chief Complaint Loss of Consciousness and Leg Swelling    HPI Katelyn Lane is a 64 y.o. female with a history of hypertension hyperlipidemia and CHF on twice daily Bumex who comes to the ED complaining of increased swelling in bilateral legs and worsening shortness of breath over the past week, dyspnea on exertion, orthopnea.  Denies chest pain.  Reports that she sometimes misses doses of her Bumex probably about 3 days a week.  Denies fevers or chills or cough.  In the treatment room, patient tried to ambulate a short distance of about 10 feet between the bathroom in the treatment bed and was too short of breath to make it, requiring two-person assistance.  She is more comfortable sitting upright versus lying down.      Past Medical History:  Diagnosis Date  . CHF (congestive heart failure) ([place])   . COVID-19   . Hyperlipidemia   . Hypertension   . Renal disorder      Patient Active Problem List   Diagnosis Date Noted  . Acute CHF (congestive heart failure) ([place]) 07/27/2019  . Prolonged QT interval 06/24/2019  . History of anemia due to chronic kidney disease 06/19/2019  . CHF, acute on chronic ([place]) 06/03/2019  . Acute on chronic combined systolic (congestive) and diastolic (congestive) heart failure ([place]) 06/03/2019  . Accelerated hypertension   . Ventricular tachycardia, non-sustained ([place])   . Chronic obstructive pulmonary disease ([place])   . Pulmonary hypertension ([place])   . Acute metabolic encephalopathy 59/16/3846  . History of substance abuse ([place]) 05/21/2019  . CHF (congestive heart failure) ([place]) 05/21/2019  . Acute systolic CHF (congestive heart failure) ([place]) 04/08/2019  . Acute on chronic combined systolic and diastolic CHF  (congestive heart failure) ([place]) 03/24/2019  . COVID-19 03/06/2019  . Acute respiratory failure with hypoxia ([place]) 03/06/2019  . Hypokalemia 03/06/2019  . LV (left ventricular) mural thrombus 03/06/2019  . CKD (chronic kidney disease), stage III 03/06/2019  . Nausea and vomiting 02/16/2019  . Abdominal pain 02/16/2019  . Rhabdomyolysis 02/16/2019  . Acute kidney injury superimposed on CKD ([place]) 02/16/2019  . Crack cocaine use 02/16/2019  . Tobacco abuse 02/16/2019  . Acute on chronic systolic CHF (congestive heart failure) ([place]) 02/16/2019  . Acute congestive heart failure ([place]) 02/13/2019  . Respiratory failure with hypoxia ([place]) 01/31/2019  . Stroke ([place]) 01/18/2019  . Dilated cardiomyopathy ([place]) 12/31/2018  . Chest tightness 12/31/2018  . Elevated troponin 12/31/2018  . Hypertensive urgency 12/31/2018  . Hyperlipidemia 12/31/2018  . Depression 12/31/2018  . HFrEF (heart failure with reduced ejection fraction) ([place]) 12/29/2018     Past Surgical History:  Procedure Laterality Date  . RIGHT/LEFT HEART CATH AND CORONARY ANGIOGRAPHY N/A 12/30/2018   Procedure: RIGHT/LEFT HEART CATH AND CORONARY ANGIOGRAPHY;  Surgeon: Corey Skains, MD;  Location: [place] [facility];  Service: Cardiovascular;  Laterality: N/A;     Prior to Admission medications   Medication Sig Start Date End Date Taking? Authorizing Provider  albuterol (VENTOLIN HFA) 108 (90 Base) MCG/ACT inhaler Inhale 2 puffs into the lungs every 6 (six) hours as needed for wheezing or shortness of breath.    [provider]  allopurinol (ZYLOPRIM) 100 MG tablet Take 1 tablet (100 mg total) by mouth daily. 06/06/19 07/06/19  Loletha Grayer, MD  ascorbic acid (VITAMIN  C) 500 MG tablet Take 500 mg by mouth daily.    [provider]  aspirin 81 MG chewable tablet Chew 1 tablet (81 mg total) by mouth daily. 05/03/19   Charlynne Cousins, MD  atorvastatin (LIPITOR) 20 MG tablet Take 1 tablet (20 mg total)  by mouth daily at 6 PM. 05/03/19 08/01/19  Charlynne Cousins, MD  bumetanide (BUMEX) 1 MG tablet Take 1 tablet (1 mg total) by mouth 2 (two) times daily. May take additional dose for worsening leg swellings/shortness of breath 06/24/19 09/22/19  Guilford Shi, MD  carvedilol (COREG) 6.25 MG tablet Take 0.5 tablets (3.125 mg total) by mouth 2 (two) times daily with a meal. 06/24/19 09/22/19  Guilford Shi, MD  docusate calcium (SURFAK) 240 MG capsule Take 240 mg by mouth daily as needed for moderate constipation.     [provider]  FLUoxetine (PROZAC) 20 MG capsule Take 1 capsule (20 mg total) by mouth daily. 05/03/19   Charlynne Cousins, MD  Ipratropium-Albuterol (COMBIVENT) 20-100 MCG/ACT AERS respimat Inhale 1 puff into the lungs every 6 (six) hours. 05/26/19   Loletha Grayer, MD  loratadine (CLARITIN) 10 MG tablet Take 10 mg by mouth daily as needed for allergies.     [provider]  nitroGLYCERIN (NITROSTAT) 0.4 MG SL tablet Place 1 tablet (0.4 mg total) under the tongue every 5 (five) minutes as needed for chest pain. 05/03/19   Charlynne Cousins, MD  pantoprazole (PROTONIX) 40 MG tablet Take 1 tablet (40 mg total) by mouth daily. 05/03/19   Charlynne Cousins, MD  rivaroxaban (XARELTO) 20 MG TABS tablet Take 1 tablet (20 mg total) by mouth daily with supper. For the blood clot in your heart. 05/03/19 08/01/19  Charlynne Cousins, MD  sacubitril-valsartan (ENTRESTO) 49-51 MG Take 1 tablet by mouth 2 (two) times daily. 05/26/19   Loletha Grayer, MD  spironolactone (ALDACTONE) 25 MG tablet Take 1 tablet (25 mg total) by mouth daily. 06/25/19 08/24/19  Guilford Shi, MD     Allergies Penicillins and Ace inhibitors   Family History  Problem Relation Age of Onset  . Breast cancer Neg Hx     Social History Social History   Tobacco Use  . Smoking status: Current Every Day Smoker  . Smokeless tobacco: Never Used  Substance Use Topics  . Alcohol use: No     Alcohol/week: 0.0 standard drinks  . Drug use: Yes    Types: Cocaine    Comment: 3-4 days ago    Review of Systems  Constitutional:   No fever or chills.  ENT:   No sore throat. No rhinorrhea. Cardiovascular:   No chest pain or syncope. Respiratory: Positive shortness of breath without cough. Gastrointestinal:   Negative for abdominal pain, vomiting and diarrhea.  Musculoskeletal: Positive bilateral leg swelling All other systems reviewed and are negative except as documented above in ROS and HPI.  ____________________________________________   PHYSICAL EXAM:  VITAL SIGNS: ED Triage Vitals  Enc Vitals Group     BP 07/26/19 1624 129/90     Pulse Rate 07/26/19 1624 69     Resp 07/26/19 1624 16     Temp 07/26/19 1624 98 F (36.7 C)     Temp Source 07/26/19 1624 Oral     SpO2 07/26/19 1624 98 %     Weight 07/26/19 1623 232 lb (105.2 kg)     Height 07/26/19 1623 5\' 7"  (1.702 m)     Head Circumference --  Peak Flow --      Pain Score 07/26/19 1629 10     Pain Loc --      Pain Edu? --      Excl. in [place]? --     Vital signs reviewed, nursing assessments reviewed.   Constitutional:   Alert and oriented. Non-toxic appearance. Eyes:   Conjunctivae are normal. EOMI. PERRL. ENT      Head:   Normocephalic and atraumatic.      Nose:   Normal.      Mouth/Throat:   Moist mucosa, no pharyngeal erythema.      Neck:   No meningismus. Full ROM. Hematological/Lymphatic/Immunilogical:   No cervical lymphadenopathy. Cardiovascular:   RRR. Symmetric bilateral radial and DP pulses.  No murmurs. Cap refill less than 2 seconds. Respiratory:   Normal respiratory effort without tachypnea/retractions.   Bilateral basilar crackles Gastrointestinal:   Soft and nontender. Non distended. There is no CVA tenderness.  No rebound, rigidity, or guarding.  Musculoskeletal:   Normal range of motion in all extremities. No joint effusions.  No lower extremity tenderness.  3+ pitting edema  bilateral lower extremities.  Symmetric calf circumference. Neurologic:   Normal speech and language.  Motor grossly intact. No acute focal neurologic deficits are appreciated.  Skin:    Skin is warm, dry and intact. No rash noted.  No petechiae, purpura, or bullae.  ____________________________________________    LABS (pertinent positives/negatives) (all labs ordered are listed, but only abnormal results are displayed) Labs Reviewed  BASIC METABOLIC PANEL - Abnormal; Notable for the following components:      Result Value   CO2 17 (*)    BUN 52 (*)    Creatinine, Ser 2.39 (*)    Calcium 8.7 (*)    GFR calc non Af Amer 21 (*)    GFR calc Af Amer 24 (*)    All other components within normal limits  CBC - Abnormal; Notable for the following components:   Hemoglobin 9.6 (*)    HCT 34.2 (*)    MCV 76.2 (*)    MCH 21.4 (*)    MCHC 28.1 (*)    RDW 22.0 (*)    nRBC 0.4 (*)    All other components within normal limits  BRAIN NATRIURETIC PEPTIDE - Abnormal; Notable for the following components:   B Natriuretic Peptide >4,500.0 (*)    All other components within normal limits  URINALYSIS, COMPLETE (UACMP) WITH MICROSCOPIC  T4, FREE  HEPATIC FUNCTION PANEL  TSH  CBG MONITORING, ED  TROPONIN I (HIGH SENSITIVITY)   ____________________________________________   EKG  Interpreted by me Sinus rhythm rate of 70, left axis, normal intervals.  Normal QRS ST segments and T waves.  ____________________________________________    EQASTMHDQ  DG Chest Portable 1 View  Result Date: 07/26/2019 CLINICAL DATA:  Dyspnea and leg edema EXAM: PORTABLE CHEST 1 VIEW COMPARISON:  None. FINDINGS: Again noted is marked cardiomegaly. Prominence of the central pulmonary vasculature with small peripheral interstitial markings are noted. No pleural effusion. No acute osseous abnormality. IMPRESSION: Cardiomegaly and interstitial edema Electronically Signed   By: Prudencio Pair M.D.   On: 07/26/2019  21:09    ____________________________________________   PROCEDURES Procedures  ____________________________________________  DIFFERENTIAL DIAGNOSIS   CHF exacerbation, pneumonia, pleural effusion, pneumothorax  CLINICAL IMPRESSION / ASSESSMENT AND PLAN / ED COURSE  Medications ordered in the ED: Medications  sodium chloride flush (NS) 0.9 % injection 3 mL (has no administration in time range)  furosemide (LASIX) injection  40 mg (40 mg Intravenous Given 07/26/19 2150)    Pertinent labs & imaging results that were available during my care of the patient were reviewed by me and considered in my medical decision making (see chart for details).  Katelyn Lane was evaluated in Emergency Department on 07/27/2019 for the symptoms described in the history of present illness. She was evaluated in the context of the global COVID-19 pandemic, which necessitated consideration that the patient might be at risk for infection with the SARS-CoV-2 virus that causes COVID-19. Institutional protocols and algorithms that pertain to the evaluation of patients at risk for COVID-19 are in a state of rapid change based on information released by regulatory bodies including the CDC and federal and state organizations. These policies and algorithms were followed during the patient's care in the ED.   Patient presents with worsening shortness of breath and leg swelling, likely CHF exacerbation due to medication noncompliance.  Vital signs unremarkable, but she is very symptomatic.  Chest x-ray does show some interstitial edema in the lungs.  Patient given dose of IV Lasix in the ED without significant urine output, will require further diuresis.  Case discussed with the hospitalist for further management.      ____________________________________________   FINAL CLINICAL IMPRESSION(S) / ED DIAGNOSES    Final diagnoses:  Acute on chronic congestive heart failure, unspecified heart failure type [facility])      ED Discharge Orders    None      Portions of this note were generated with dragon dictation software. Dictation errors may occur despite best attempts at proofreading.   Carrie Mew, MD 07/27/19 [phone]

## 2019-07-27 NOTE — ED Notes (Signed)
Report to laurie, rn.

## 2019-07-27 NOTE — ED Notes (Signed)
Pt states sister [name] Anselm Lis is not to be given any personal information regarding the pt

## 2019-07-27 NOTE — ED Notes (Signed)
Pt will not allow rn to check blood pressure at this time and will not allow a pox check. Pt states "leave me alone" and covers head back up with blankets.

## 2019-07-27 NOTE — Consult Note (Signed)
[facility] Consultation Note  Patient ID: Katelyn Lane, MRN: [id number], DOB/AGE: Jul 26, 1955 64 y.o. Admit date: 07/26/2019   Date of Consult: 07/27/2019 Primary Physician: [name], MD Primary Cardiologist:   Chief Complaint:  Chief Complaint  Patient presents with  . Loss of Consciousness  . Leg Swelling   Reason for Consult: Acute on chronic systolic dysfunction congestive heart failure  HPI: 64 y.o. female with known severe LV systolic dysfunction with ejection fraction of 25% and chronic systolic heart failure on appropriate previous medication management including spironolactone furosemide carvedilol Entresto and atorvastatin.  The patient had been doing relatively well but had significant progression of severe shortness of breath lower extremity edema and pulmonary edema hypoxia and with a fall.  With this fall she hurt her foot and is having significant pain at this time.  Chest x-ray has shown congestive heart failure with pulmonary edema BNP was 4500 and troponin was 158 consistent with demand ischemia.  EKG shows normal sinus rhythm left atrial enlargement left axis deviation and anterior infarct age undetermined.  The patient still has had a significant amount of pain in her foot although slight improvements of concerns of shortness of breath.  The intravenous Lasix has helped.  Additional concerns is chronic kidney disease stage IV with a glomerular filtration rate of 24 which may present as a significant concern in addition to her chronic anemia and a hemoglobin of 9.6.  Past Medical History:  Diagnosis Date  . CHF (congestive heart failure) ([place])   . COVID-19   . Hyperlipidemia   . Hypertension   . Renal disorder       Surgical History:  Past Surgical History:  Procedure Laterality Date  . RIGHT/LEFT HEART CATH AND CORONARY ANGIOGRAPHY N/A 12/30/2018   Procedure: RIGHT/LEFT HEART CATH AND CORONARY ANGIOGRAPHY;  Surgeon: Corey Skains,  MD;  Location: [place] [facility];  Service: Cardiovascular;  Laterality: N/A;     Home Meds: Prior to Admission medications   Medication Sig Start Date End Date Taking? Authorizing Provider  albuterol (VENTOLIN HFA) 108 (90 Base) MCG/ACT inhaler Inhale 2 puffs into the lungs every 6 (six) hours as needed for wheezing or shortness of breath.   Yes [provider]  allopurinol (ZYLOPRIM) 100 MG tablet Take 1 tablet (100 mg total) by mouth daily. 06/06/19 07/27/19 Yes Wieting, Richard, MD  ascorbic acid (VITAMIN C) 500 MG tablet Take 500 mg by mouth daily.   Yes [provider]  aspirin 81 MG chewable tablet Chew 1 tablet (81 mg total) by mouth daily. 05/03/19  Yes Charlynne Cousins, MD  atorvastatin (LIPITOR) 20 MG tablet Take 1 tablet (20 mg total) by mouth daily at 6 PM. 05/03/19 08/01/19 Yes Charlynne Cousins, MD  bumetanide (BUMEX) 1 MG tablet Take 1 tablet (1 mg total) by mouth 2 (two) times daily. May take additional dose for worsening leg swellings/shortness of breath 06/24/19 09/22/19 Yes Guilford Shi, MD  carvedilol (COREG) 6.25 MG tablet Take 0.5 tablets (3.125 mg total) by mouth 2 (two) times daily with a meal. 06/24/19 09/22/19 Yes Guilford Shi, MD  FLUoxetine (PROZAC) 20 MG capsule Take 1 capsule (20 mg total) by mouth daily. 05/03/19  Yes Charlynne Cousins, MD  Ipratropium-Albuterol (COMBIVENT) 20-100 MCG/ACT AERS respimat Inhale 1 puff into the lungs every 6 (six) hours. 05/26/19  Yes Wieting, Richard, MD  loratadine (CLARITIN) 10 MG tablet Take 10 mg by mouth daily as needed for allergies.    Yes [provider]  nitroGLYCERIN (NITROSTAT) 0.4 MG SL tablet Place 1 tablet (0.4 mg total) under the tongue every 5 (five) minutes as needed for chest pain. 05/03/19  Yes Charlynne Cousins, MD  pantoprazole (PROTONIX) 40 MG tablet Take 1 tablet (40 mg total) by mouth daily. 05/03/19  Yes Charlynne Cousins, MD  rivaroxaban (XARELTO) 20 MG TABS tablet  Take 1 tablet (20 mg total) by mouth daily with supper. For the blood clot in your heart. 05/03/19 08/01/19 Yes Charlynne Cousins, MD  sacubitril-valsartan (ENTRESTO) 49-51 MG Take 1 tablet by mouth 2 (two) times daily. 05/26/19  Yes Wieting, Richard, MD  spironolactone (ALDACTONE) 25 MG tablet Take 1 tablet (25 mg total) by mouth daily. 06/25/19 08/24/19 Yes Guilford Shi, MD  docusate calcium (SURFAK) 240 MG capsule Take 240 mg by mouth daily as needed for moderate constipation.  Patient not taking: Reported on 07/27/2019    [provider]    Inpatient Medications:  . aspirin EC  81 mg Oral Daily  . atorvastatin  20 mg Oral Daily  . carvedilol  3.125 mg Oral BID WC  . FLUoxetine  20 mg Oral Daily  . furosemide  40 mg Intravenous Q12H  . rivaroxaban  20 mg Oral Q supper  . sodium chloride flush  3 mL Intravenous Once  . sodium chloride flush  3 mL Intravenous Q12H  . spironolactone  25 mg Oral Daily   . sodium chloride      Allergies:  Allergies  Allergen Reactions  . Penicillins Hives, Rash and Other (See Comments)    Did it involve swelling of the face/tongue/throat, SOB, or low BP? No Did it involve sudden or severe rash/hives, skin peeling, or any reaction on the inside of your mouth or nose? Yes Did you need to seek medical attention at a hospital or doctor's office? Yes When did it last happen? >25 years If all above answers are "NO", may proceed with cephalosporin use.   . Ace Inhibitors Nausea And Vomiting    Social History   Socioeconomic History  . Marital status: Single    Spouse name: Not on file  . Number of children: Not on file  . Years of education: Not on file  . Highest education level: Not on file  Occupational History  . Not on file  Tobacco Use  . Smoking status: Current Every Day Smoker  . Smokeless tobacco: Never Used  Substance and Sexual Activity  . Alcohol use: No    Alcohol/week: 0.0 standard drinks  . Drug use: Yes     Types: Cocaine    Comment: 3-4 days ago  . Sexual activity: Not on file  Other Topics Concern  . Not on file  Social History Narrative  . Not on file   Social Determinants of Health   Financial Resource Strain:   . Difficulty of Paying Living Expenses:   Food Insecurity:   . Worried About Charity fundraiser in the Last Year:   . Arboriculturist in the Last Year:   Transportation Needs:   . Film/video editor (Medical):   Marland Kitchen Lack of Transportation (Non-Medical):   Physical Activity:   . Days of Exercise per Week:   . Minutes of Exercise per Session:   Stress:   . Feeling of Stress :   Social Connections:   . Frequency of Communication with Friends and Family:   . Frequency of Social Gatherings with Friends and Family:   .  Attends Religious Services:   . Active Member of Clubs or Organizations:   . Attends Archivist Meetings:   Marland Kitchen Marital Status:   Intimate Partner Violence:   . Fear of Current or Ex-Partner:   . Emotionally Abused:   Marland Kitchen Physically Abused:   . Sexually Abused:      Family History  Problem Relation Age of Onset  . Breast cancer Neg Hx      Review of Systems Positive for shortness of breath foot pain Negative for: General:  chills, fever, night sweats or weight changes.  Cardiovascular: PND orthopnea syncope dizziness  Dermatological skin lesions rashes Respiratory: Cough congestion Urologic: Frequent urination urination at night and hematuria Abdominal: negative for nausea, vomiting, diarrhea, bright red blood per rectum, melena, or hematemesis Neurologic: negative for visual changes, and/or hearing changes  All other systems reviewed and are otherwise negative except as noted above.  Labs: No results for input(s): CKTOTAL, CKMB, TROPONINI in the last 72 hours. Lab Results  Component Value Date   WBC 5.0 07/26/2019   HGB 9.6 (L) 07/26/2019   HCT 34.2 (L) 07/26/2019   MCV 76.2 (L) 07/26/2019   PLT 177 07/26/2019    Recent Labs   Lab 07/26/19 1631 07/27/19 1039  NA 139  --   K 4.5  --   CL 111  --   CO2 17*  --   BUN 52*  --   CREATININE 2.39*  --   CALCIUM 8.7*  --   PROT  --  6.1*  BILITOT  --  1.1  ALKPHOS  --  184*  ALT  --  17  AST  --  24  GLUCOSE 75  --    Lab Results  Component Value Date   CHOL 100 05/22/2019   HDL 46 05/22/2019   LDLCALC 43 05/22/2019   TRIG 56 05/22/2019   No results found for: DDIMER  Radiology/Studies:  DG Chest Portable 1 View  Result Date: 07/26/2019 CLINICAL DATA:  Dyspnea and leg edema EXAM: PORTABLE CHEST 1 VIEW COMPARISON:  None. FINDINGS: Again noted is marked cardiomegaly. Prominence of the central pulmonary vasculature with small peripheral interstitial markings are noted. No pleural effusion. No acute osseous abnormality. IMPRESSION: Cardiomegaly and interstitial edema Electronically Signed   By: Prudencio Pair M.D.   On: 07/26/2019 21:09   DG Foot Complete Right  Result Date: 07/27/2019 CLINICAL DATA:  Leg swelling right foot pain EXAM: RIGHT FOOT COMPLETE - 3+ VIEW COMPARISON:  01/05/2014 FINDINGS: No acute displaced fracture or malalignment. Chronic erosions at the head of the first metatarsal. Flattening of the plantar arch. IMPRESSION: 1. No acute osseous abnormality. Electronically Signed   By: Donavan Foil M.D.   On: 07/27/2019 19:34    EKG: Normal sinus rhythm left atrial enlargement left axis deviation anterior infarct age undetermined  Weights: Filed Weights   07/26/19 1623 07/27/19 0824  Weight: 105.2 kg 111.2 kg     Physical Exam: Blood pressure 116/85, pulse 71, temperature 98.1 F (36.7 C), temperature source Oral, resp. rate 18, height 5\' 7"  (1.702 m), weight 111.2 kg, SpO2 98 %. Body mass index is 38.39 kg/m. General: Well developed, well nourished, in no acute distress. Head eyes ears nose throat: Normocephalic, atraumatic, sclera non-icteric, no xanthomas, nares are without discharge. No apparent thyromegaly and/or mass  Lungs:  Normal respiratory effort.  no wheezes, basilar rales, no rhonchi.  Heart: RRR with normal S1 S2. no murmur gallop, no rub, PMI is normal size and  placement, carotid upstroke normal without bruit, jugular venous pressure is normal Abdomen: Soft, non-tender, non-distended with normoactive bowel sounds. No hepatomegaly. No rebound/guarding. No obvious abdominal masses. Abdominal aorta is normal size without bruit Extremities: Trace to 1+ edema. no cyanosis, no clubbing, no ulcers  Peripheral : 2+ bilateral upper extremity pulses, 2+ bilateral femoral pulses, 2+ bilateral dorsal pedal pulse Neuro: Alert and oriented. No facial asymmetry. No focal deficit. Moves all extremities spontaneously. Musculoskeletal: Normal muscle tone without kyphosis Psych:  Responds to questions appropriately with a normal affect.    Assessment: 64 year old female with acute on chronic systolic dysfunction congestive heart failure with pulmonary edema chronic kidney disease anemia elevated troponin consistent with demand ischemia rather than acute coronary syndrome slightly improved  Plan: 1.  Continue carvedilol of spironolactone furosemide for acute on chronic systolic dysfunction congestive heart failure. 2.  Suspend Entresto at this time due to chronic kidney disease with a GFR below 30 3.  Further consideration evaluation of concerns of cause of anemia other than chronic kidney disease 4.  Further concerns of possible use of Lasix drip and would consider the possibility of nephrology consultation for chronic kidney disease and longer-term management 5.  No further cardiac diagnostics necessary at this time due to no evidence of myocardial infarction 6.  Continue supportive care of foot injury Signed, Corey Skains M.D. [facility] 07/27/2019, 7:59 PM

## 2019-07-27 NOTE — ED Notes (Signed)
Asked david in pharmacy to retime lasix for 10:00 due to lasix administration at 2200 and order is for every 12 hours. Asked david to retime lovenox and entresto, pt communicated to rn when sandwich provided that she did not wish to be distrubed for lab work or more medications until the morning after she slept.

## 2019-07-28 DIAGNOSIS — Z590 Homelessness: Secondary | ICD-10-CM | POA: Diagnosis not present

## 2019-07-28 DIAGNOSIS — N179 Acute kidney failure, unspecified: Secondary | ICD-10-CM | POA: Diagnosis not present

## 2019-07-28 DIAGNOSIS — I5041 Acute combined systolic (congestive) and diastolic (congestive) heart failure: Secondary | ICD-10-CM | POA: Diagnosis not present

## 2019-07-28 LAB — BASIC METABOLIC PANEL
Anion gap: 8 (ref 5–15)
BUN: 48 mg/dL — ABNORMAL HIGH (ref 8–23)
CO2: 26 mmol/L (ref 22–32)
Calcium: 8.9 mg/dL (ref 8.9–10.3)
Chloride: 108 mmol/L (ref 98–111)
Creatinine, Ser: 2.04 mg/dL — ABNORMAL HIGH (ref 0.44–1.00)
GFR calc Af Amer: 29 mL/min — ABNORMAL LOW (ref >60)
GFR calc non Af Amer: 25 mL/min — ABNORMAL LOW (ref >60)
Glucose, Bld: 122 mg/dL — ABNORMAL HIGH (ref 70–99)
Potassium: 4.2 mmol/L (ref 3.5–5.1)
Sodium: 142 mmol/L (ref 135–145)

## 2019-07-28 LAB — CBC
HCT: 31.5 % — ABNORMAL LOW (ref 36.0–46.0)
Hemoglobin: 9.3 g/dL — ABNORMAL LOW (ref 12.0–15.0)
MCH: 21.3 pg — ABNORMAL LOW (ref 26.0–34.0)
MCHC: 29.5 g/dL — ABNORMAL LOW (ref 30.0–36.0)
MCV: 72.2 fL — ABNORMAL LOW (ref 80.0–100.0)
Platelets: 272 10*3/uL (ref 150–400)
RBC: 4.36 MIL/uL (ref 3.87–5.11)
RDW: 21.3 % — ABNORMAL HIGH (ref 11.5–15.5)
WBC: 6 10*3/uL (ref 4.0–10.5)
nRBC: 0 % (ref 0.0–0.2)

## 2019-07-28 MED ORDER — IPRATROPIUM-ALBUTEROL 0.5-2.5 (3) MG/3ML IN SOLN
3.0000 mL | Freq: Four times a day (QID) | RESPIRATORY_TRACT | Status: DC | PRN
  Administered 2019-07-28: 3 mL via RESPIRATORY_TRACT
  Filled 2019-07-28 (×4): qty 3

## 2019-07-28 NOTE — Progress Notes (Signed)
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 Monitor I/Os. Continue on tele. Continue on coreg, aldactone. Hold entresto secondary to AKI. Echo on 06/22/19 showed EF 25-50%, grade III diastolic dysfunction. 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 Cr is trending down today. 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 No need for a transfusion at this time. Will continue to monitor   HTN: continue on coreg. Hold enteresto secondary to AKI   HLD: continue on statin  Depression: severity unknown. Continue on home dose of xarelto   Homeless: CM is aware. Hx of non-compliance and leaving AMA  DVT prophylaxis: xarelto  Code Status: full  Family Communication:  Disposition Plan: depends on PT/OT recs   Consultants:   Cardio, Dr. Nehemiah Massed     Procedures:   Antimicrobials:   Subjective: Pt c/o fatigue & right foot pain   Objective: Vitals:   07/27/19 2004 07/28/19 0445 07/28/19 0449 07/28/19 0746  BP: (!) 113/93 (!) 135/100 (!) 126/102 (!) 119/93  Pulse: (!) 40 74 84 86  Resp: 20 20  18   Temp: 97.9 F (36.6 C) (!) 97.5 F (36.4 C)  98.4 F (36.9 C)  TempSrc:      SpO2: 95% 100%  98%  Weight:   112.6 kg   Height:        Intake/Output Summary (Last 24 hours) at 07/28/2019 0841 Last data filed at 07/28/2019 0504 Gross per 24 hour  Intake 960 ml  Output 2000 ml  Net -1040 ml   Filed Weights   07/26/19 1623 07/27/19 0824 07/28/19 0449  Weight: 105.2 kg 111.2 kg 112.6 kg    Examination:  General exam: Appears calm and comfortable  Respiratory system: decreased breath sounds b/l/. No rales Cardiovascular system: S1 & S2 +. No  rubs, gallops or clicks. B/l LE edema  Gastrointestinal system:  Abdomen is nondistended, soft and nontender.  Hypoactive bowel sounds heard. Central nervous system: Lethargic. Moves all 4 extremities  Psychiatry: Judgement and insight appear normal.  Flat mood and affect.     Data Reviewed: I have personally reviewed following labs and imaging studies  CBC: Recent Labs  Lab 07/26/19 1631 07/28/19 0543  WBC 5.0 6.0  HGB 9.6* 9.3*  HCT 34.2* 31.5*  MCV 76.2* 72.2*  PLT 177 578   Basic Metabolic Panel: Recent Labs  Lab 07/26/19 1631 07/28/19 0543  NA 139 142  K 4.5 4.2  CL 111 108  CO2 17* 26  GLUCOSE 75 122*  BUN 52* 48*  CREATININE 2.39* 2.04*  CALCIUM 8.7* 8.9   GFR: Estimated Creatinine Clearance: 36.5 mL/min (A) (by C-G formula based on SCr of 2.04 mg/dL (H)). Liver Function Tests: Recent Labs  Lab 07/27/19 1039  AST 24  ALT 17  ALKPHOS 184*  BILITOT 1.1  PROT 6.1*  ALBUMIN 2.7*   No results for input(s): LIPASE, AMYLASE in the last 168 hours. No results for input(s): AMMONIA in the last 168 hours. Coagulation Profile: No results for input(s): INR, PROTIME in the last 168 hours. Cardiac Enzymes: No results for input(s): CKTOTAL, CKMB, CKMBINDEX, TROPONINI in the last 168 hours. BNP (last 3 results) No results for input(s): PROBNP in the last 8760 hours. HbA1C: No results for  input(s): HGBA1C in the last 72 hours. CBG: No results for input(s): GLUCAP in the last 168 hours. Lipid Profile: No results for input(s): CHOL, HDL, LDLCALC, TRIG, CHOLHDL, LDLDIRECT in the last 72 hours. Thyroid Function Tests: Recent Labs    07/27/19 1039  TSH 9.549*  FREET4 0.97   Anemia Panel: No results for input(s): VITAMINB12, FOLATE, FERRITIN, TIBC, IRON, RETICCTPCT in the last 72 hours. Sepsis Labs: No results for input(s): PROCALCITON, LATICACIDVEN in the last 168 hours.  No results found for this or any previous visit (from the past 240 hour(s)).       Radiology Studies: DG Chest Portable 1 View  Result Date:  07/26/2019 CLINICAL DATA:  Dyspnea and leg edema EXAM: PORTABLE CHEST 1 VIEW COMPARISON:  None. FINDINGS: Again noted is marked cardiomegaly. Prominence of the central pulmonary vasculature with small peripheral interstitial markings are noted. No pleural effusion. No acute osseous abnormality. IMPRESSION: Cardiomegaly and interstitial edema Electronically Signed   By: Prudencio Pair M.D.   On: 07/26/2019 21:09   DG Foot Complete Right  Result Date: 07/27/2019 CLINICAL DATA:  Leg swelling right foot pain EXAM: RIGHT FOOT COMPLETE - 3+ VIEW COMPARISON:  01/05/2014 FINDINGS: No acute displaced fracture or malalignment. Chronic erosions at the head of the first metatarsal. Flattening of the plantar arch. IMPRESSION: 1. No acute osseous abnormality. Electronically Signed   By: Donavan Foil M.D.   On: 07/27/2019 19:34        Scheduled Meds: . aspirin EC  81 mg Oral Daily  . atorvastatin  20 mg Oral Daily  . carvedilol  3.125 mg Oral BID WC  . FLUoxetine  20 mg Oral Daily  . furosemide  40 mg Intravenous Q12H  . rivaroxaban  20 mg Oral Q supper  . sodium chloride flush  3 mL Intravenous Once  . sodium chloride flush  3 mL Intravenous Q12H  . spironolactone  25 mg Oral Daily   Continuous Infusions: . sodium chloride       LOS: 1 day    Time spent: 31 mins     [name], MD [facility] Pager [phone]  If 7PM-7AM, please contact night-coverage [email or web address]  07/28/2019, 8:41 AM

## 2019-07-28 NOTE — Progress Notes (Signed)
CCMD called with another 7 beats PVC. Messaged MD and cardiology. Will inform next shift to follow-up as this RN is going off. Patient resting quietly.

## 2019-07-28 NOTE — Progress Notes (Addendum)
Pt uncooperative with staff this morning.  Refuses to get out of bed to stand for daily weight. Reports pain to right foot.  Continues to refuse despite pain med offered.  Previously up to bedside commode earlier in shift and stood independently.

## 2019-07-28 NOTE — Progress Notes (Signed)
Per CCMD, patient had 7 beats wide QRS. Checked on patient and she was sleeping, denies any symptoms. Back to SR 90. Notified Dr. Jimmye Norman. Will monitor.

## 2019-07-28 NOTE — Progress Notes (Signed)
Patient resting quietly. No distress.

## 2019-07-28 NOTE — Progress Notes (Signed)
Patient with increasing dyspnea with minimal exertion. Duoneb given with mild improvement and evening IV lasix given now per Dr. Jimmye Norman. MD aware of low urine output today - difficult to quantify as NT said each episode was mixed with a bowel movement, but she is estimating 235mL per void. Patient requests to be left alone to rest. Will monitor.

## 2019-07-28 NOTE — Progress Notes (Signed)
[facility] Encounter Note  Patient: Katelyn Lane / Admit Date: 07/26/2019 / Date of Encounter: 07/28/2019, 1:55 PM   Subjective: Patient not significantly different from yesterday.  Still short of breath weak and fatigued but lying further back and more comfortable.  Patient still has a significant foot pain.  Troponin elevation to 158 consistent with demand ischemia and BNP elevation consistent with congestive heart failure with chest x-ray and previous x-ray echocardiogram showing congestive heart failure.  Review of Systems: Positive for: Shortness of breath Negative for: Vision change, hearing change, syncope, dizziness, nausea, vomiting,diarrhea, bloody stool, stomach pain, cough, congestion, diaphoresis, urinary frequency, urinary pain,skin lesions, skin rashes Others previously listed  Objective: Telemetry: Normal sinus rhythm Physical Exam: Blood pressure 117/86, pulse 85, temperature 98.6 F (37 C), resp. rate 19, height 5\' 7"  (1.702 m), weight 112.6 kg, SpO2 100 %. Body mass index is 38.89 kg/m. General: Well developed, well nourished, in no acute distress. Head: Normocephalic, atraumatic, sclera non-icteric, no xanthomas, nares are without discharge. Neck: No apparent masses Lungs: Normal respirations with n few wheezes, no rhonchi, some rales , no crackles   Heart: Regular rate and rhythm, normal S1 S2, no murmur, no rub, no gallop, PMI is normal size and placement, carotid upstroke normal without bruit, jugular venous pressure normal Abdomen: Soft, non-tender, non-distended with normoactive bowel sounds. No hepatosplenomegaly. Abdominal aorta is normal size without bruit Extremities: 1+ edema, no clubbing, no cyanosis, no ulcers,  Peripheral: 2+ radial, 2+ femoral, 2+ dorsal pedal pulses Neuro: Alert and oriented. Moves all extremities spontaneously. Psych:  Responds to questions appropriately with a normal affect.   Intake/Output Summary (Last 24  hours) at 07/28/2019 1355 Last data filed at 07/28/2019 1354 Gross per 24 hour  Intake 1440 ml  Output 2000 ml  Net -560 ml    Inpatient Medications:   aspirin EC  81 mg Oral Daily   atorvastatin  20 mg Oral Daily   carvedilol  3.125 mg Oral BID WC   FLUoxetine  20 mg Oral Daily   furosemide  40 mg Intravenous Q12H   rivaroxaban  20 mg Oral Q supper   sodium chloride flush  3 mL Intravenous Once   sodium chloride flush  3 mL Intravenous Q12H   spironolactone  25 mg Oral Daily   Infusions:   sodium chloride      Labs: Recent Labs    07/26/19 1631 07/28/19 0543  NA 139 142  K 4.5 4.2  CL 111 108  CO2 17* 26  GLUCOSE 75 122*  BUN 52* 48*  CREATININE 2.39* 2.04*  CALCIUM 8.7* 8.9   Recent Labs    07/27/19 1039  AST 24  ALT 17  ALKPHOS 184*  BILITOT 1.1  PROT 6.1*  ALBUMIN 2.7*   Recent Labs    07/26/19 1631 07/28/19 0543  WBC 5.0 6.0  HGB 9.6* 9.3*  HCT 34.2* 31.5*  MCV 76.2* 72.2*  PLT 177 272   No results for input(s): CKTOTAL, CKMB, TROPONINI in the last 72 hours. Invalid input(s): POCBNP No results for input(s): HGBA1C in the last 72 hours.   Weights: Filed Weights   07/26/19 1623 07/27/19 0824 07/28/19 0449  Weight: 105.2 kg 111.2 kg 112.6 kg     Radiology/Studies:  DG Chest Portable 1 View  Result Date: 07/26/2019 CLINICAL DATA:  Dyspnea and leg edema EXAM: PORTABLE CHEST 1 VIEW COMPARISON:  None. FINDINGS: Again noted is marked cardiomegaly. Prominence of the central pulmonary vasculature with small peripheral  interstitial markings are noted. No pleural effusion. No acute osseous abnormality. IMPRESSION: Cardiomegaly and interstitial edema Electronically Signed   By: Prudencio Pair M.D.   On: 07/26/2019 21:09   DG Foot Complete Right  Result Date: 07/27/2019 CLINICAL DATA:  Leg swelling right foot pain EXAM: RIGHT FOOT COMPLETE - 3+ VIEW COMPARISON:  01/05/2014 FINDINGS: No acute displaced fracture or malalignment. Chronic erosions  at the head of the first metatarsal. Flattening of the plantar arch. IMPRESSION: 1. No acute osseous abnormality. Electronically Signed   By: Donavan Foil M.D.   On: 07/27/2019 19:34     Assessment and Recommendation  64 y.o. female with acute on chronic systolic dysfunction congestive heart failure with pulmonary edema elevated BNP multifactorial in nature including LV dysfunction chronic kidney disease stage IV and anemia with a troponin level consistent with demand ischemia and no current evidence of acute coronary syndrome 1.  Continue furosemide intravenously for continued acute on chronic systolic dysfunction congestive heart failure 2.  Further evaluation and treatment of anemia possibly exacerbating above 3.  Continue watching closely for chronic kidney disease and need for change to other treatment including Lasix drip for which nephrology consultation may be reasonable 4.  No further cardiac diagnostics necessary at this time 5.  Continue carvedilol but avoid ACE inhibitor dapagliflozin and Entresto due to significant chronic kidney disease 6.  Cardiac rehabilitation  Signed, Serafina Royals M.D. FACC

## 2019-07-29 DIAGNOSIS — I5041 Acute combined systolic (congestive) and diastolic (congestive) heart failure: Secondary | ICD-10-CM | POA: Diagnosis not present

## 2019-07-29 LAB — BASIC METABOLIC PANEL
Anion gap: 14 (ref 5–15)
BUN: 49 mg/dL — ABNORMAL HIGH (ref 8–23)
CO2: 20 mmol/L — ABNORMAL LOW (ref 22–32)
Calcium: 8.7 mg/dL — ABNORMAL LOW (ref 8.9–10.3)
Chloride: 103 mmol/L (ref 98–111)
Creatinine, Ser: 2.51 mg/dL — ABNORMAL HIGH (ref 0.44–1.00)
GFR calc Af Amer: 23 mL/min — ABNORMAL LOW (ref >60)
GFR calc non Af Amer: 20 mL/min — ABNORMAL LOW (ref >60)
Glucose, Bld: 126 mg/dL — ABNORMAL HIGH (ref 70–99)
Potassium: 4.8 mmol/L (ref 3.5–5.1)
Sodium: 137 mmol/L (ref 135–145)

## 2019-07-29 LAB — CBC
HCT: 33.1 % — ABNORMAL LOW (ref 36.0–46.0)
Hemoglobin: 9.7 g/dL — ABNORMAL LOW (ref 12.0–15.0)
MCH: 20.9 pg — ABNORMAL LOW (ref 26.0–34.0)
MCHC: 29.3 g/dL — ABNORMAL LOW (ref 30.0–36.0)
MCV: 71.3 fL — ABNORMAL LOW (ref 80.0–100.0)
Platelets: 290 10*3/uL (ref 150–400)
RBC: 4.64 MIL/uL (ref 3.87–5.11)
RDW: 21.5 % — ABNORMAL HIGH (ref 11.5–15.5)
WBC: 8.6 10*3/uL (ref 4.0–10.5)
nRBC: 0.5 % — ABNORMAL HIGH (ref 0.0–0.2)

## 2019-07-29 MED ORDER — FUROSEMIDE 10 MG/ML IJ SOLN
6.0000 mg/h | INTRAVENOUS | Status: DC
  Administered 2019-07-29 – 2019-08-01 (×2): 4 mg/h via INTRAVENOUS
  Administered 2019-08-03 – 2019-08-04 (×2): 6 mg/h via INTRAVENOUS
  Filled 2019-07-29 (×5): qty 25

## 2019-07-29 NOTE — Progress Notes (Signed)
Mobility Specialist - Progress Note   07/29/19 1253  Mobility  Activity Refused mobility  Mobility performed by Mobility specialist     Pt was sleep upon arrival and declined mobility session. Noticed SOB without activity. O2 measured, sitting at 90%. Nurse was notified. Will attempt session at another time.     Kathee Delton Mobility Specialist 07/29/19, 12:59 PM

## 2019-07-29 NOTE — Progress Notes (Signed)
PROGRESS NOTE    Katelyn Lane  WER:[id number] DOB: [birth year] DOA: 07/26/2019 PCP: [name], MD    Brief Narrative:  64 year old female with known history of chronic systolic heart failure, recent COVID-19 infection, hyperlipidemia and hypertension, stage IIIb chronic kidney disease presented to the emergency room with acute onset of worsening dyspnea associated with dry cough wheezing, orthopnea and PND as well lower extremity edema for 1 week.  Patient had previous admissions with noncompliance.  She smokes cocaine. At the emergency room vitals were stable.  BUN 52/creatinine 2.39.  BNP 4500.  Chest x-ray showed cardiomegaly and interstitial pulmonary edema.  Patient was given IV Lasix and admission requested for CHF exacerbation.   Assessment & Plan:   Active Problems:   Acute CHF (congestive heart failure) (HCC)  Acute on chronic combined congestive heart failure: Recent echocardiogram with ejection fraction 25-30 percent. Patient was admitted and started on IV Lasix, inadequate diuresis with advanced kidney disease. We will change to IV Lasix, start with 4 mg/h today. Continue carvedilol and Aldactone.  Hold Entresto secondary to AKI.  Appreciate cardiology follow-up.  AKI on CKD stage IIIb: Likely secondary to above.  Creatinine is trending up.  Lasix infusion and close monitoring.  Anemia of chronic disease: Hemoglobin low but is stable.  Hypertension: On carvedilol.  Entresto on hold.  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   DVT prophylaxis:  rivaroxaban (XARELTO) tablet 20 mg   Code Status: Full code Family Communication: None Disposition Plan: Status is: Inpatient  Remains inpatient appropriate because:IV treatments appropriate due to intensity of illness or inability to take PO   Dispo: The patient is from: Home              Anticipated d/c is to: Home              Anticipated d/c date is: 3 days              Patient currently is not  medically stable to d/c.         Consultants:   cardiology  Procedures:   None   Antimicrobials:   None     Subjective: Patient was seen and examined.  No overnight events.  He still feels short of breath.  Difficulty sleeping flat in the bed.  Legs are swollen and painful.  Objective: Vitals:   07/28/19 1956 07/28/19 1958 07/29/19 0543 07/29/19 1130  BP: (!) 118/104 (!) 118/95 (!) 123/91 (!) 115/93  Pulse: 82 83 (!) 105 93  Resp: 20  20 19   Temp: (!) 97.5 F (36.4 C)  97.8 F (36.6 C)   TempSrc:      SpO2: 100%  99% 100%  Weight:      Height:        Intake/Output Summary (Last 24 hours) at 07/29/2019 1611 Last data filed at 07/29/2019 0818 Gross per 24 hour  Intake 363 ml  Output 175 ml  Net 188 ml   Filed Weights   07/26/19 1623 07/27/19 0824 07/28/19 0449  Weight: 105.2 kg 111.2 kg 112.6 kg    Examination:  Physical Exam Constitutional:      Appearance: She is ill-appearing.  HENT:     Head: Normocephalic.  Cardiovascular:     Rate and Rhythm: Normal rate.  Pulmonary:     Comments: B/l basal crackles and poor air entry  Musculoskeletal:        General: Swelling present.     Right lower leg: Edema present.  Left lower leg: Edema present.  Neurological:     Mental Status: She is oriented to person, place, and time.       Data Reviewed: I have personally reviewed following labs and imaging studies  CBC: Recent Labs  Lab 07/26/19 1631 07/28/19 0543 07/29/19 0736  WBC 5.0 6.0 8.6  HGB 9.6* 9.3* 9.7*  HCT 34.2* 31.5* 33.1*  MCV 76.2* 72.2* 71.3*  PLT 177 272 500   Basic Metabolic Panel: Recent Labs  Lab 07/26/19 1631 07/28/19 0543 07/29/19 0736  NA 139 142 137  K 4.5 4.2 4.8  CL 111 108 103  CO2 17* 26 20*  GLUCOSE 75 122* 126*  BUN 52* 48* 49*  CREATININE 2.39* 2.04* 2.51*  CALCIUM 8.7* 8.9 8.7*   GFR: Estimated Creatinine Clearance: 29.7 mL/min (A) (by C-G formula based on SCr of 2.51 mg/dL (H)). Liver Function  Tests: Recent Labs  Lab 07/27/19 1039  AST 24  ALT 17  ALKPHOS 184*  BILITOT 1.1  PROT 6.1*  ALBUMIN 2.7*   No results for input(s): LIPASE, AMYLASE in the last 168 hours. No results for input(s): AMMONIA in the last 168 hours. Coagulation Profile: No results for input(s): INR, PROTIME in the last 168 hours. Cardiac Enzymes: No results for input(s): CKTOTAL, CKMB, CKMBINDEX, TROPONINI in the last 168 hours. BNP (last 3 results) No results for input(s): PROBNP in the last 8760 hours. HbA1C: No results for input(s): HGBA1C in the last 72 hours. CBG: No results for input(s): GLUCAP in the last 168 hours. Lipid Profile: No results for input(s): CHOL, HDL, LDLCALC, TRIG, CHOLHDL, LDLDIRECT in the last 72 hours. Thyroid Function Tests: Recent Labs    07/27/19 1039  TSH 9.549*  FREET4 0.97   Anemia Panel: No results for input(s): VITAMINB12, FOLATE, FERRITIN, TIBC, IRON, RETICCTPCT in the last 72 hours. Sepsis Labs: No results for input(s): PROCALCITON, LATICACIDVEN in the last 168 hours.  No results found for this or any previous visit (from the past 240 hour(s)).       Radiology Studies: DG Foot Complete Right  Result Date: 07/27/2019 CLINICAL DATA:  Leg swelling right foot pain EXAM: RIGHT FOOT COMPLETE - 3+ VIEW COMPARISON:  01/05/2014 FINDINGS: No acute displaced fracture or malalignment. Chronic erosions at the head of the first metatarsal. Flattening of the plantar arch. IMPRESSION: 1. No acute osseous abnormality. Electronically Signed   By: Donavan Foil M.D.   On: 07/27/2019 19:34        Scheduled Meds: . aspirin EC  81 mg Oral Daily  . atorvastatin  20 mg Oral Daily  . carvedilol  3.125 mg Oral BID WC  . FLUoxetine  20 mg Oral Daily  . rivaroxaban  20 mg Oral Q supper  . sodium chloride flush  3 mL Intravenous Once  . sodium chloride flush  3 mL Intravenous Q12H   Continuous Infusions: . sodium chloride    . furosemide (LASIX) infusion 4 mg/hr  (07/29/19 1307)     LOS: 2 days    Time spent: 30 minutes    Barb Merino, MD [facility] Pager [phone]

## 2019-07-29 NOTE — Progress Notes (Signed)
0355 Patient requesting NEB treatment. RT called for treatment 0405 RT in patient room for treatment. Patient refused NEB treatment.

## 2019-07-29 NOTE — Plan of Care (Signed)
  Problem: Education: Goal: Knowledge of General Education information will improve Description Including pain rating scale, medication(s)/side effects and non-pharmacologic comfort measures Outcome: Progressing   

## 2019-07-30 ENCOUNTER — Inpatient Hospital Stay: Payer: Medicaid Other

## 2019-07-30 DIAGNOSIS — I5041 Acute combined systolic (congestive) and diastolic (congestive) heart failure: Secondary | ICD-10-CM | POA: Diagnosis not present

## 2019-07-30 LAB — BASIC METABOLIC PANEL
Anion gap: 11 (ref 5–15)
BUN: 52 mg/dL — ABNORMAL HIGH (ref 8–23)
CO2: 21 mmol/L — ABNORMAL LOW (ref 22–32)
Calcium: 8.3 mg/dL — ABNORMAL LOW (ref 8.9–10.3)
Chloride: 104 mmol/L (ref 98–111)
Creatinine, Ser: 2.96 mg/dL — ABNORMAL HIGH (ref 0.44–1.00)
GFR calc Af Amer: 19 mL/min — ABNORMAL LOW (ref >60)
GFR calc non Af Amer: 16 mL/min — ABNORMAL LOW (ref >60)
Glucose, Bld: 130 mg/dL — ABNORMAL HIGH (ref 70–99)
Potassium: 4.4 mmol/L (ref 3.5–5.1)
Sodium: 136 mmol/L (ref 135–145)

## 2019-07-30 NOTE — Progress Notes (Signed)
Mobility Specialist - Progress Note   07/30/19 1400  Mobility  Activity Dangled on edge of bed  Level of Assistance Moderate assist, patient does 50-74% (+2 assist)  Mobility Response Tolerated well  Mobility performed by Mobility specialist  $Mobility charge 1 Mobility    Pre-mobility: 87 HR, 120/87 BP, 100% SpO2 Post-mobility: 87 HR, 98% SpO2  Pt was in bed upon arrival. Pt agreed to session. Pt needed mod. Assist +2 to get to EOB d/t pain in LE. Pt did express pain during transition, but was motivated to proceed session. Pt dangled EOB. Pt was able to perform knee highs at EOB 10x per leg with mod. Assist. Pt did express SOB during exercise. O2 was monitored, sitting at 98%. Pt asked to clean her face with wet washcloth. Mobility Specialist provided assist with set-up. Pt was laid back into bed with mod. Assist +2, expressing the same pain she had during initial transition. Pt states her pain was "12/10". Nurse was notified. Per discussion w/ nurse, pt has been witnessed getting up and ambulating independently. In conclusion, pt tolerated session well. Pt was left in bed with call bell and phone in reach.     Kegan Mckeithan Mobility Specialist  07/30/19, 2:57 PM

## 2019-07-30 NOTE — Progress Notes (Signed)
[facility] Encounter Note  Patient: Katelyn Lane / Admit Date: 07/26/2019 / Date of Encounter: 2019/08/03, 1:51 PM   Subjective: Patient significantly improved from admission with nice improvements from Lasix infusion.  Patient is tolerating other medication management as well.  Still short of breath weak and fatigued but lying further back and more comfortable.  Patient without significant foot pain.  Troponin elevation to 158 consistent with demand ischemia and BNP elevation consistent with congestive heart failure with chest x-ray and previous x-ray echocardiogram showing congestive heart failure.  Review of Systems: Positive for: Shortness of breath Negative for: Vision change, hearing change, syncope, dizziness, nausea, vomiting,diarrhea, bloody stool, stomach pain, cough, congestion, diaphoresis, urinary frequency, urinary pain,skin lesions, skin rashes Others previously listed  Objective: Telemetry: Normal sinus rhythm Physical Exam: Blood pressure (!) 113/86, pulse 88, temperature 97.8 F (36.6 C), resp. rate 20, height 5\' 7"  (1.702 m), weight 115.8 kg, SpO2 99 %. Body mass index is 39.98 kg/m. General: Well developed, well nourished, in no acute distress. Head: Normocephalic, atraumatic, sclera non-icteric, no xanthomas, nares are without discharge. Neck: No apparent masses Lungs: Normal respirations with n few wheezes, no rhonchi, some rales , no crackles   Heart: Regular rate and rhythm, normal S1 S2, no murmur, no rub, no gallop, PMI is normal size and placement, carotid upstroke normal without bruit, jugular venous pressure normal Abdomen: Soft, non-tender, non-distended with normoactive bowel sounds. No hepatosplenomegaly. Abdominal aorta is normal size without bruit Extremities: 1+ edema, no clubbing, no cyanosis, no ulcers,  Peripheral: 2+ radial, 2+ femoral, 2+ dorsal pedal pulses Neuro: Alert and oriented. Moves all extremities  spontaneously. Psych:  Responds to questions appropriately with a normal affect.   Intake/Output Summary (Last 24 hours) at 08/03/19 1351 Last data filed at 2019/08/03 0600 Gross per 24 hour  Intake 63.53 ml  Output 0 ml  Net 63.53 ml    Inpatient Medications:  . aspirin EC  81 mg Oral Daily  . atorvastatin  20 mg Oral Daily  . carvedilol  3.125 mg Oral BID WC  . FLUoxetine  20 mg Oral Daily  . rivaroxaban  20 mg Oral Q supper  . sodium chloride flush  3 mL Intravenous Once  . sodium chloride flush  3 mL Intravenous Q12H   Infusions:  . sodium chloride    . furosemide (LASIX) infusion 4 mg/hr (07/29/19 1307)    Labs: Recent Labs    07/29/19 0736 08-03-19 0608  NA 137 136  K 4.8 4.4  CL 103 104  CO2 20* 21*  GLUCOSE 126* 130*  BUN 49* 52*  CREATININE 2.51* 2.96*  CALCIUM 8.7* 8.3*   No results for input(s): AST, ALT, ALKPHOS, BILITOT, PROT, ALBUMIN in the last 72 hours. Recent Labs    07/28/19 0543 07/29/19 0736  WBC 6.0 8.6  HGB 9.3* 9.7*  HCT 31.5* 33.1*  MCV 72.2* 71.3*  PLT 272 290   No results for input(s): CKTOTAL, CKMB, TROPONINI in the last 72 hours. Invalid input(s): POCBNP No results for input(s): HGBA1C in the last 72 hours.   Weights: Filed Weights   07/28/19 0449 08-03-19 0448 August 03, 2019 0816  Weight: 112.6 kg 113.4 kg 115.8 kg     Radiology/Studies:  US RENAL  Result Date: August 03, 2019 CLINICAL DATA:  Acute on chronic renal failure. EXAM: RENAL / URINARY TRACT ULTRASOUND COMPLETE COMPARISON:  CT abdomen pelvis 03/24/2019, renal ultrasound 03/25/2018 FINDINGS: Right Kidney: Renal measurements: 9.7 x 4.1 x 4.6 cm = volume: 96 mL .  Echogenicity is diffusely increased. There is a cyst in the right kidney measuring 1.4 x 1.0 x 1.4 cm, previously measuring 1.1 x 1.0 x 1.0 cm. Left Kidney: Renal measurements: 10.0 x 5.4 x 5.8 cm = volume: 164 mL. Echogenicity is diffusely increased. No definite mass or hydronephrosis visualized. Bladder: Appears  normal for degree of bladder distention. Other: There is mild perihepatic ascites. IMPRESSION: 1. Limited exam secondary to patient body habitus, shadowing bowel gas and difficulty with positioning. No definite acute finding in the kidneys. 2. Diffusely increased echogenicity of the bilateral kidneys consistent with medical renal disease. 3.  Small cyst in the right kidney. 4.  Mild perihepatic ascites. Electronically Signed   By: Audie Pinto M.D.   On: 07/30/2019 10:50   DG Chest Portable 1 View  Result Date: 07/26/2019 CLINICAL DATA:  Dyspnea and leg edema EXAM: PORTABLE CHEST 1 VIEW COMPARISON:  None. FINDINGS: Again noted is marked cardiomegaly. Prominence of the central pulmonary vasculature with small peripheral interstitial markings are noted. No pleural effusion. No acute osseous abnormality. IMPRESSION: Cardiomegaly and interstitial edema Electronically Signed   By: Prudencio Pair M.D.   On: 07/26/2019 21:09   DG Foot Complete Right  Result Date: 07/27/2019 CLINICAL DATA:  Leg swelling right foot pain EXAM: RIGHT FOOT COMPLETE - 3+ VIEW COMPARISON:  01/05/2014 FINDINGS: No acute displaced fracture or malalignment. Chronic erosions at the head of the first metatarsal. Flattening of the plantar arch. IMPRESSION: 1. No acute osseous abnormality. Electronically Signed   By: Donavan Foil M.D.   On: 07/27/2019 19:34     Assessment and Recommendation  64 y.o. female with acute on chronic systolic dysfunction congestive heart failure with pulmonary edema elevated BNP multifactorial in nature including LV dysfunction chronic kidney disease stage IV and anemia with a troponin level consistent with demand ischemia and no current evidence of acute coronary syndrome but significant improvements since admission 1.  Continue furosemide intravenously for continued acute on chronic systolic dysfunction congestive heart failure 2.  Continue further evaluation and treatment of anemia possibly exacerbating  above 3.  Continue watching closely for chronic kidney disease continue Lasix drip for which nephrology has administered 4.  No further cardiac diagnostics necessary at this time 5.  Continue carvedilol but avoid ACE inhibitor dapagliflozin and Entresto due to significant chronic kidney disease 6.  Cardiac rehabilitation 7.  If patient continues to improve with above and has improved ambulation without any significant symptoms would be okay for discharge home from cardiac standpoint with follow-up in 1 to 2 weeks for further treatment options  Signed, Serafina Royals M.D. FACC

## 2019-07-30 NOTE — Progress Notes (Signed)
PROGRESS NOTE    Katelyn Lane  RJJ:[id number] DOB: 03/27/1955 DOA: 07/26/2019 PCP: [name], MD    Brief Narrative:  64 year old female with known history of chronic systolic heart failure, recent COVID-19 infection, hyperlipidemia and hypertension, stage IIIb chronic kidney disease presented to the emergency room with acute onset of worsening dyspnea associated with dry cough wheezing, orthopnea and PND as well lower extremity edema for 1 week.  Patient had previous admissions with noncompliance.  She smokes cocaine. At the emergency room vitals were stable.  BUN 52/creatinine 2.39.  BNP 4500.  Chest x-ray showed cardiomegaly and interstitial pulmonary edema.  Patient was given IV Lasix and admission requested for CHF exacerbation.   Assessment & Plan:   Active Problems:   Acute CHF (congestive heart failure) (HCC)  Acute on chronic combined congestive heart failure:  Recent echocardiogram with ejection fraction 25-30 percent. Patient was admitted and started on IV Lasix, inadequate diuresis with advanced kidney disease. Patient with no adequate diuresis.  No strict urine output measurement. Continue carvedilol and Aldactone.  Hold Entresto secondary to AKI.  Appreciate cardiology follow-up. We will continue IV Lasix infusion today and hope of diuresing. Renal ultrasound done today, does not show any hydronephrosis or retention.  Shows medical renal disease.  AKI on CKD stage IIIb: Likely secondary to above.  Noted baseline creatinine about 2.  Creatinine trending up.  Lasix infusion and close monitoring.  Anemia of chronic disease: Hemoglobin low but is stable.  Hypertension: On carvedilol.  Entresto on hold.  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   DVT prophylaxis:  rivaroxaban (XARELTO) tablet 20 mg   Code Status: Full code Family Communication: None Disposition Plan: Status is: Inpatient  Remains inpatient appropriate because:IV treatments  appropriate due to intensity of illness or inability to take PO   Dispo: The patient is from: Home              Anticipated d/c is to: Home              Anticipated d/c date is: 3 days              Patient currently is not medically stable to d/c.         Consultants:   cardiology  Procedures:   None   Antimicrobials:   None     Subjective: Patient seen and examined.  Had some dry cough.  Legs are painful.  She thinks she is making urine, did not allow pure wick and exact measurement.  Objective: Vitals:   07/30/19 0448 07/30/19 0746 07/30/19 0816 07/30/19 1120  BP: (!) 117/93 111/87  (!) 113/86  Pulse: 78 90  88  Resp: (!) 24 18  20   Temp:  97.8 F (36.6 C)  97.8 F (36.6 C)  TempSrc:  Oral    SpO2: 98% 100%  99%  Weight: 113.4 kg  115.8 kg   Height:   5\' 7"  (1.702 m)     Intake/Output Summary (Last 24 hours) at 07/30/2019 1143 Last data filed at 07/30/2019 0600 Gross per 24 hour  Intake 163.53 ml  Output 0 ml  Net 163.53 ml   Filed Weights   07/28/19 0449 07/30/19 0448 07/30/19 0816  Weight: 112.6 kg 113.4 kg 115.8 kg    Examination:  Physical Exam Constitutional:      Appearance: She is ill-appearing.  HENT:     Head: Normocephalic.  Cardiovascular:     Rate and Rhythm: Normal rate.  Pulmonary:  Comments: B/l basal crackles and poor air entry  Musculoskeletal:        General: Swelling present.     Right lower leg: Edema present.     Left lower leg: Edema present.  Neurological:     Mental Status: She is oriented to person, place, and time.       Data Reviewed: I have personally reviewed following labs and imaging studies  CBC: Recent Labs  Lab 07/26/19 1631 07/28/19 0543 07/29/19 0736  WBC 5.0 6.0 8.6  HGB 9.6* 9.3* 9.7*  HCT 34.2* 31.5* 33.1*  MCV 76.2* 72.2* 71.3*  PLT 177 272 127   Basic Metabolic Panel: Recent Labs  Lab 07/26/19 1631 07/28/19 0543 07/29/19 0736 07/30/19 0608  NA 139 142 137 136  K 4.5 4.2 4.8  4.4  CL 111 108 103 104  CO2 17* 26 20* 21*  GLUCOSE 75 122* 126* 130*  BUN 52* 48* 49* 52*  CREATININE 2.39* 2.04* 2.51* 2.96*  CALCIUM 8.7* 8.9 8.7* 8.3*   GFR: Estimated Creatinine Clearance: 25.6 mL/min (A) (by C-G formula based on SCr of 2.96 mg/dL (H)). Liver Function Tests: Recent Labs  Lab 07/27/19 1039  AST 24  ALT 17  ALKPHOS 184*  BILITOT 1.1  PROT 6.1*  ALBUMIN 2.7*   No results for input(s): LIPASE, AMYLASE in the last 168 hours. No results for input(s): AMMONIA in the last 168 hours. Coagulation Profile: No results for input(s): INR, PROTIME in the last 168 hours. Cardiac Enzymes: No results for input(s): CKTOTAL, CKMB, CKMBINDEX, TROPONINI in the last 168 hours. BNP (last 3 results) No results for input(s): PROBNP in the last 8760 hours. HbA1C: No results for input(s): HGBA1C in the last 72 hours. CBG: No results for input(s): GLUCAP in the last 168 hours. Lipid Profile: No results for input(s): CHOL, HDL, LDLCALC, TRIG, CHOLHDL, LDLDIRECT in the last 72 hours. Thyroid Function Tests: No results for input(s): TSH, T4TOTAL, FREET4, T3FREE, THYROIDAB in the last 72 hours. Anemia Panel: No results for input(s): VITAMINB12, FOLATE, FERRITIN, TIBC, IRON, RETICCTPCT in the last 72 hours. Sepsis Labs: No results for input(s): PROCALCITON, LATICACIDVEN in the last 168 hours.  No results found for this or any previous visit (from the past 240 hour(s)).       Radiology Studies: US RENAL  Result Date: 07/30/2019 CLINICAL DATA:  Acute on chronic renal failure. EXAM: RENAL / URINARY TRACT ULTRASOUND COMPLETE COMPARISON:  CT abdomen pelvis 03/24/2019, renal ultrasound 03/25/2018 FINDINGS: Right Kidney: Renal measurements: 9.7 x 4.1 x 4.6 cm = volume: 96 mL . Echogenicity is diffusely increased. There is a cyst in the right kidney measuring 1.4 x 1.0 x 1.4 cm, previously measuring 1.1 x 1.0 x 1.0 cm. Left Kidney: Renal measurements: 10.0 x 5.4 x 5.8 cm = volume: 164  mL. Echogenicity is diffusely increased. No definite mass or hydronephrosis visualized. Bladder: Appears normal for degree of bladder distention. Other: There is mild perihepatic ascites. IMPRESSION: 1. Limited exam secondary to patient body habitus, shadowing bowel gas and difficulty with positioning. No definite acute finding in the kidneys. 2. Diffusely increased echogenicity of the bilateral kidneys consistent with medical renal disease. 3.  Small cyst in the right kidney. 4.  Mild perihepatic ascites. Electronically Signed   By: Audie Pinto M.D.   On: 07/30/2019 10:50        Scheduled Meds: . aspirin EC  81 mg Oral Daily  . atorvastatin  20 mg Oral Daily  . carvedilol  3.125 mg  Oral BID WC  . FLUoxetine  20 mg Oral Daily  . rivaroxaban  20 mg Oral Q supper  . sodium chloride flush  3 mL Intravenous Once  . sodium chloride flush  3 mL Intravenous Q12H   Continuous Infusions: . sodium chloride    . furosemide (LASIX) infusion 4 mg/hr (07/29/19 1307)     LOS: 3 days    Time spent: 30 minutes    Barb Merino, MD [facility] Pager [phone]

## 2019-07-31 DIAGNOSIS — I5041 Acute combined systolic (congestive) and diastolic (congestive) heart failure: Secondary | ICD-10-CM | POA: Diagnosis not present

## 2019-07-31 LAB — BASIC METABOLIC PANEL
Anion gap: 4 — ABNORMAL LOW (ref 5–15)
BUN: 54 mg/dL — ABNORMAL HIGH (ref 8–23)
CO2: 27 mmol/L (ref 22–32)
Calcium: 8.4 mg/dL — ABNORMAL LOW (ref 8.9–10.3)
Chloride: 106 mmol/L (ref 98–111)
Creatinine, Ser: 2.9 mg/dL — ABNORMAL HIGH (ref 0.44–1.00)
GFR calc Af Amer: 19 mL/min — ABNORMAL LOW (ref >60)
GFR calc non Af Amer: 17 mL/min — ABNORMAL LOW (ref >60)
Glucose, Bld: 116 mg/dL — ABNORMAL HIGH (ref 70–99)
Potassium: 4 mmol/L (ref 3.5–5.1)
Sodium: 137 mmol/L (ref 135–145)

## 2019-07-31 LAB — FERRITIN: Ferritin: 57 ng/mL (ref 11–307)

## 2019-07-31 MED ORDER — OXYCODONE HCL 5 MG PO TABS
5.0000 mg | ORAL_TABLET | Freq: Once | ORAL | Status: AC
  Administered 2019-07-31: 5 mg via ORAL
  Filled 2019-07-31: qty 1

## 2019-07-31 NOTE — Progress Notes (Signed)
PROGRESS NOTE    Katelyn Lane  TML:[id number] DOB: May 16, 1955 DOA: 07/26/2019 PCP: [name], MD    Brief Narrative:  64 year old female with known history of chronic systolic heart failure, recent COVID-19 infection, hyperlipidemia and hypertension, stage IIIb chronic kidney disease presented to the emergency room with acute onset of worsening dyspnea associated with dry cough wheezing, orthopnea and PND as well lower extremity edema for 1 week.  Patient had previous admissions with noncompliance.  She smokes cocaine and she is unfortunately homeless. At the emergency room vitals were stable.  BUN 52/creatinine 2.39.  BNP 4500.  Chest x-ray showed cardiomegaly and interstitial pulmonary edema.  Patient was given IV Lasix and admission requested for CHF exacerbation.   Assessment & Plan:   Active Problems:   Acute CHF (congestive heart failure) (HCC)  Acute on chronic combined congestive heart failure:  Recent echocardiogram with ejection fraction 25-30 percent. Patient was admitted and started on IV Lasix, inadequate diuresis with advanced kidney disease. Patient with no adequate diuresis.  No strict urine output measurement. Continue carvedilol and Aldactone.  Hold Entresto secondary to AKI.  Appreciate cardiology follow-up. She had 1450 mL urine output with IV Lasix infusion.  -2.5 L balance. Renal ultrasound done today, does not show any hydronephrosis or retention.  Shows medical renal disease. Discussed with nephrology to see her in consultation.  AKI on CKD stage IIIb: Likely secondary to above.  Noted baseline creatinine about 2.  Creatinine trending up.  Lasix infusion and close monitoring.  Anemia of chronic disease: Hemoglobin low but is stable.  Hypertension: On carvedilol.  Entresto on hold.  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  Discussed with nephrology.   DVT prophylaxis:  rivaroxaban (XARELTO) tablet 20 mg   Code Status: Full  code Family Communication: None Disposition Plan: Status is: Inpatient  Remains inpatient appropriate because:IV treatments appropriate due to intensity of illness or inability to take PO   Dispo: The patient is from: Home              Anticipated d/c is to: Home              Anticipated d/c date is: 3 days              Patient currently is not medically stable to d/c.         Consultants:   Cardiology  Nephrology  Procedures:   None   Antimicrobials:   None     Subjective: Patient seen and examined.  She was sleepy.  Complains of difficulty breathing, however was ambulating with no shortness of breath.  On room air.  She does have painful extremities with edema. Objective: Vitals:   07/31/19 0416 07/31/19 0741 07/31/19 1138 07/31/19 1140  BP: (!) 100/87 114/84 (!) 108/64 110/71  Pulse: 76 85 88   Resp: 18 18 17    Temp: 97.6 F (36.4 C) 98.2 F (36.8 C) 98.1 F (36.7 C)   TempSrc: Oral Oral Oral   SpO2: 97% 96% 100%   Weight: (!) 115.3 kg     Height:        Intake/Output Summary (Last 24 hours) at 07/31/2019 1401 Last data filed at 07/31/2019 0930 Gross per 24 hour  Intake 528 ml  Output 1350 ml  Net -822 ml   Filed Weights   07/30/19 0448 07/30/19 0816 07/31/19 0416  Weight: 113.4 kg 115.8 kg (!) 115.3 kg    Examination:  Physical Exam Constitutional:      Appearance:  She is ill-appearing.  HENT:     Head: Normocephalic.  Cardiovascular:     Rate and Rhythm: Normal rate.  Pulmonary:     Comments: B/l basal crackles and poor air entry  Musculoskeletal:        General: Swelling present.     Right lower leg: Edema present.     Left lower leg: Edema present.  Neurological:     Mental Status: She is oriented to person, place, and time.   Tender extremities.    Data Reviewed: I have personally reviewed following labs and imaging studies  CBC: Recent Labs  Lab 07/26/19 1631 07/28/19 0543 07/29/19 0736  WBC 5.0 6.0 8.6  HGB 9.6*  9.3* 9.7*  HCT 34.2* 31.5* 33.1*  MCV 76.2* 72.2* 71.3*  PLT 177 272 431   Basic Metabolic Panel: Recent Labs  Lab 07/26/19 1631 07/28/19 0543 07/29/19 0736 07/30/19 0608 07/31/19 0605  NA 139 142 137 136 137  K 4.5 4.2 4.8 4.4 4.0  CL 111 108 103 104 106  CO2 17* 26 20* 21* 27  GLUCOSE 75 122* 126* 130* 116*  BUN 52* 48* 49* 52* 54*  CREATININE 2.39* 2.04* 2.51* 2.96* 2.90*  CALCIUM 8.7* 8.9 8.7* 8.3* 8.4*   GFR: Estimated Creatinine Clearance: 26 mL/min (A) (by C-G formula based on SCr of 2.9 mg/dL (H)). Liver Function Tests: Recent Labs  Lab 07/27/19 1039  AST 24  ALT 17  ALKPHOS 184*  BILITOT 1.1  PROT 6.1*  ALBUMIN 2.7*   No results for input(s): LIPASE, AMYLASE in the last 168 hours. No results for input(s): AMMONIA in the last 168 hours. Coagulation Profile: No results for input(s): INR, PROTIME in the last 168 hours. Cardiac Enzymes: No results for input(s): CKTOTAL, CKMB, CKMBINDEX, TROPONINI in the last 168 hours. BNP (last 3 results) No results for input(s): PROBNP in the last 8760 hours. HbA1C: No results for input(s): HGBA1C in the last 72 hours. CBG: No results for input(s): GLUCAP in the last 168 hours. Lipid Profile: No results for input(s): CHOL, HDL, LDLCALC, TRIG, CHOLHDL, LDLDIRECT in the last 72 hours. Thyroid Function Tests: No results for input(s): TSH, T4TOTAL, FREET4, T3FREE, THYROIDAB in the last 72 hours. Anemia Panel: No results for input(s): VITAMINB12, FOLATE, FERRITIN, TIBC, IRON, RETICCTPCT in the last 72 hours. Sepsis Labs: No results for input(s): PROCALCITON, LATICACIDVEN in the last 168 hours.  No results found for this or any previous visit (from the past 240 hour(s)).       Radiology Studies: US RENAL  Result Date: 07/30/2019 CLINICAL DATA:  Acute on chronic renal failure. EXAM: RENAL / URINARY TRACT ULTRASOUND COMPLETE COMPARISON:  CT abdomen pelvis 03/24/2019, renal ultrasound 03/25/2018 FINDINGS: Right Kidney:  Renal measurements: 9.7 x 4.1 x 4.6 cm = volume: 96 mL . Echogenicity is diffusely increased. There is a cyst in the right kidney measuring 1.4 x 1.0 x 1.4 cm, previously measuring 1.1 x 1.0 x 1.0 cm. Left Kidney: Renal measurements: 10.0 x 5.4 x 5.8 cm = volume: 164 mL. Echogenicity is diffusely increased. No definite mass or hydronephrosis visualized. Bladder: Appears normal for degree of bladder distention. Other: There is mild perihepatic ascites. IMPRESSION: 1. Limited exam secondary to patient body habitus, shadowing bowel gas and difficulty with positioning. No definite acute finding in the kidneys. 2. Diffusely increased echogenicity of the bilateral kidneys consistent with medical renal disease. 3.  Small cyst in the right kidney. 4.  Mild perihepatic ascites. Electronically Signed   By: Izora Gala  Dimas Aguas M.D.   On: 07/30/2019 10:50        Scheduled Meds: . aspirin EC  81 mg Oral Daily  . atorvastatin  20 mg Oral Daily  . carvedilol  3.125 mg Oral BID WC  . FLUoxetine  20 mg Oral Daily  . rivaroxaban  20 mg Oral Q supper  . sodium chloride flush  3 mL Intravenous Once  . sodium chloride flush  3 mL Intravenous Q12H   Continuous Infusions: . sodium chloride    . furosemide (LASIX) infusion 4 mg/hr (07/29/19 1307)     LOS: 4 days    Time spent: 30 minutes    Barb Merino, MD [facility] Pager [phone]

## 2019-07-31 NOTE — Progress Notes (Addendum)
   07/31/19 1600  Clinical Encounter Type  Visited With Patient  Visit Type Follow-up  Referral From Chaplain  Consult/Referral To [place] told [name] that patient was back in the hospital. [name] went to visit with patient. Patient told chaplain that she was in the hospital because of fluid. After visiting for a while, chaplain realize patient was tired, therefore she asked if she could pray and if so what should she pray for. Patient said yes. She asked chaplain to pray for her son, [name] that he would be alright, that he would follow Jesus, and accept God. She also wanted chaplain to pray for her health. Chaplain prayed and during pray, patient fell asleep. Chaplain started charting in at the nurse's station and read patient had recently used drugs.  Chaplain went back in the room and asked patient about her use of drugs and told her that she would pray for the area of her need. Patient admitted to using cocaine. Chaplain told her that she can now pray about her getting off of drugs. Before chaplain could start praying, patient started praying with tears streaming from her eyes. She asked God to separate her from evil and to help her. She said that she has been like this to long and needs his help. She asked God to help her. After she finished, chaplain agreed in prayer by asking God for deliverance and his help for patient. When the prayer was done, chaplain suggest that patient would not go around those that have drugs. And with this question came the question of housing and patient said that she has no where to go. She asked chaplain to help her. Chaplain told her that others at [facility] had tried in the past to assist her and that she could look to see what could be done (but chaplain did not make any promises.) Patient asked chaplain to visit her again and chaplain agreed to. Chaplain will follow up with patient in the morning.

## 2019-07-31 NOTE — Progress Notes (Signed)
[facility]  ROUNDING NOTE   Subjective:   [name] admitted to [facility] on 07/26/2019 for Acute CHF (congestive heart failure) ([place]) [I50.9] Acute on chronic congestive heart failure, unspecified heart failure type [facility]) [I50.9]  Nephrology was consulted for acute renal failure. Placed on furosemide gtt.   Patient is well known to me as I follow her in the office for her chronic kidney disease stage IIIB.   Objective:  Vital signs in last 24 hours:  Temp:  [97.6 F (36.4 C)-98.3 F (36.8 C)] 98.1 F (36.7 C) (07/23 1138) Pulse Rate:  [74-88] 88 (07/23 1138) Resp:  [17-24] 17 (07/23 1138) BP: (100-114)/(64-87) 110/71 (07/23 1140) SpO2:  [96 %-100 %] 100 % (07/23 1138) Weight:  [115.3 kg] 115.3 kg (07/23 0416)  Weight change: 2.355 kg Filed Weights   07/30/19 0448 07/30/19 0816 07/31/19 0416  Weight: 113.4 kg 115.8 kg (!) 115.3 kg    Intake/Output: I/O last 3 completed shifts: In: 501.5 [P.O.:350; I.V.:151.5] Out: 1450 [Urine:1450]   Intake/Output this shift:  Total I/O In: 480 [P.O.:480] Out: 825 [Urine:825]  Physical Exam: General: NAD,   Head: Normocephalic, atraumatic. Moist oral mucosal membranes  Eyes: Anicteric, PERRL  Neck: Supple, trachea midline  Lungs:  Bilateral crackles  Heart: Regular rate and rhythm  Abdomen:  Soft, nontender,   Extremities:  ++ peripheral edema.  Neurologic: Nonfocal, moving all four extremities  Skin: No lesions        Basic Metabolic Panel: Recent Labs  Lab 07/26/19 1631 07/26/19 1631 07/28/19 0543 07/28/19 0543 07/29/19 0736 07/30/19 0608 07/31/19 0605  NA 139  --  142  --  137 136 137  K 4.5  --  4.2  --  4.8 4.4 4.0  CL 111  --  108  --  103 104 106  CO2 17*  --  26  --  20* 21* 27  GLUCOSE 75  --  122*  --  126* 130* 116*  BUN 52*  --  48*  --  49* 52* 54*  CREATININE 2.39*  --  2.04*  --  2.51* 2.96* 2.90*  CALCIUM 8.7*   < > 8.9   < > 8.7* 8.3* 8.4*   < > = values in this interval not  displayed.    Liver Function Tests: Recent Labs  Lab 07/27/19 1039  AST 24  ALT 17  ALKPHOS 184*  BILITOT 1.1  PROT 6.1*  ALBUMIN 2.7*   No results for input(s): LIPASE, AMYLASE in the last 168 hours. No results for input(s): AMMONIA in the last 168 hours.  CBC: Recent Labs  Lab 07/26/19 1631 07/28/19 0543 07/29/19 0736  WBC 5.0 6.0 8.6  HGB 9.6* 9.3* 9.7*  HCT 34.2* 31.5* 33.1*  MCV 76.2* 72.2* 71.3*  PLT 177 272 290    Cardiac Enzymes: No results for input(s): CKTOTAL, CKMB, CKMBINDEX, TROPONINI in the last 168 hours.  BNP: Invalid input(s): POCBNP  CBG: No results for input(s): GLUCAP in the last 168 hours.  Microbiology: Results for orders placed or performed during the hospital encounter of 06/19/19  SARS Coronavirus 2 by RT PCR (hospital order, performed in [facility] hospital lab) Nasopharyngeal Nasopharyngeal Swab     Status: None   Collection Time: 06/19/19  9:28 AM   Specimen: Nasopharyngeal Swab  Result Value Ref Range Status   SARS Coronavirus 2 NEGATIVE NEGATIVE Final    Comment: (NOTE) SARS-CoV-2 target nucleic acids are NOT DETECTED.  The SARS-CoV-2 RNA is generally detectable  in upper and lower respiratory specimens during the acute phase of infection. The lowest concentration of SARS-CoV-2 viral copies this assay can detect is 250 copies / mL. A negative result does not preclude SARS-CoV-2 infection and should not be used as the sole basis for treatment or other patient management decisions.  A negative result may occur with improper specimen collection / handling, submission of specimen other than nasopharyngeal swab, presence of viral mutation(s) within the areas targeted by this assay, and inadequate number of viral copies (<250 copies / mL). A negative result must be combined with clinical observations, patient history, and epidemiological information.  Fact Sheet for Patients:   StrictlyIdeas.no  Fact  Sheet for Healthcare Providers: BankingDealers.co.za  This test is not yet approved or  cleared by the [place] FDA and has been authorized for detection and/or diagnosis of SARS-CoV-2 by FDA under an Emergency Use Authorization (EUA).  This EUA will remain in effect (meaning this test can be used) for the duration of the COVID-19 declaration under Section 564(b)(1) of the Act, 21 U.S.C. section 360bbb-3(b)(1), unless the authorization is terminated or revoked sooner.  Performed at [facility], [address] [place], [place] [address]     Coagulation Studies: No results for input(s): LABPROT, INR in the last 72 hours.  Urinalysis: No results for input(s): COLORURINE, LABSPEC, PHURINE, GLUCOSEU, HGBUR, BILIRUBINUR, KETONESUR, PROTEINUR, UROBILINOGEN, NITRITE, LEUKOCYTESUR in the last 72 hours.  Invalid input(s): APPERANCEUR    Imaging: US RENAL  Result Date: 07/30/2019 CLINICAL DATA:  Acute on chronic renal failure. EXAM: RENAL / URINARY TRACT ULTRASOUND COMPLETE COMPARISON:  CT abdomen pelvis 03/24/2019, renal ultrasound 03/25/2018 FINDINGS: Right Kidney: Renal measurements: 9.7 x 4.1 x 4.6 cm = volume: 96 mL . Echogenicity is diffusely increased. There is a cyst in the right kidney measuring 1.4 x 1.0 x 1.4 cm, previously measuring 1.1 x 1.0 x 1.0 cm. Left Kidney: Renal measurements: 10.0 x 5.4 x 5.8 cm = volume: 164 mL. Echogenicity is diffusely increased. No definite mass or hydronephrosis visualized. Bladder: Appears normal for degree of bladder distention. Other: There is mild perihepatic ascites. IMPRESSION: 1. Limited exam secondary to patient body habitus, shadowing bowel gas and difficulty with positioning. No definite acute finding in the kidneys. 2. Diffusely increased echogenicity of the bilateral kidneys consistent with medical renal disease. 3.  Small cyst in the right kidney. 4.  Mild perihepatic ascites. Electronically Signed   By:  Audie Pinto M.D.   On: 07/30/2019 10:50     Medications:    sodium chloride     furosemide (LASIX) infusion 4 mg/hr (07/29/19 1307)    aspirin EC  81 mg Oral Daily   atorvastatin  20 mg Oral Daily   carvedilol  3.125 mg Oral BID WC   FLUoxetine  20 mg Oral Daily   rivaroxaban  20 mg Oral Q supper   sodium chloride flush  3 mL Intravenous Once   sodium chloride flush  3 mL Intravenous Q12H   sodium chloride, acetaminophen, ALPRAZolam, ipratropium-albuterol, sodium chloride flush, traMADol  Assessment/ Plan:  [name] is a 64 y.o. black female with hypertension, hyperlipidemia, depression , polysubstance abuse, recent COVID-19 infection who was admitted to [facility] on 07/26/2019 for Acute CHF (congestive heart failure) ([place]) [I50.9] Acute on chronic congestive heart failure, unspecified heart failure type ([place]) [I50.9]  1. Acute renal failure on chronic kidney disease stage IIIB with proteinuria: baseline creatinine 1.7, GFR of 32 on 03/08/19. Suspect patient may have progression  of chronic kidney disease from multiple episodes of acute kidney injury with limited recovery.  Chronic kidney disease has been thought to be secondary to hypertension in the past.  Acute renal failure secondary to acute cardiopulmonary syndrome.  No IV contrast exposure. Renal ultrasound with obstruction.  - Currently holding Entresto  2. Hypertension with acute exacerbation of chronic systolic and diastolic congestive heart failure: echocardiogram on 06/22/19.  Home regimen of bumetanide, carvedilol, Entresto, and spironolactone.  - furosemide gtt  3. Anemia with kidney failure: hemoglobin 9.7. microcytic - Check iron studies. Check SPEP/UPEP   LOS: 4 Trestin Vences 7/23/20213:23 PM

## 2019-08-01 ENCOUNTER — Inpatient Hospital Stay: Payer: Medicaid Other

## 2019-08-01 DIAGNOSIS — I5041 Acute combined systolic (congestive) and diastolic (congestive) heart failure: Secondary | ICD-10-CM | POA: Diagnosis not present

## 2019-08-01 LAB — CBC
HCT: 31 % — ABNORMAL LOW (ref 36.0–46.0)
Hemoglobin: 9.1 g/dL — ABNORMAL LOW (ref 12.0–15.0)
MCH: 20.9 pg — ABNORMAL LOW (ref 26.0–34.0)
MCHC: 29.4 g/dL — ABNORMAL LOW (ref 30.0–36.0)
MCV: 71.3 fL — ABNORMAL LOW (ref 80.0–100.0)
Platelets: 302 10*3/uL (ref 150–400)
RBC: 4.35 MIL/uL (ref 3.87–5.11)
RDW: 20.7 % — ABNORMAL HIGH (ref 11.5–15.5)
WBC: 8.5 10*3/uL (ref 4.0–10.5)
nRBC: 0.6 % — ABNORMAL HIGH (ref 0.0–0.2)

## 2019-08-01 LAB — BASIC METABOLIC PANEL
Anion gap: 10 (ref 5–15)
BUN: 56 mg/dL — ABNORMAL HIGH (ref 8–23)
CO2: 25 mmol/L (ref 22–32)
Calcium: 8.2 mg/dL — ABNORMAL LOW (ref 8.9–10.3)
Chloride: 102 mmol/L (ref 98–111)
Creatinine, Ser: 2.66 mg/dL — ABNORMAL HIGH (ref 0.44–1.00)
GFR calc Af Amer: 21 mL/min — ABNORMAL LOW (ref >60)
GFR calc non Af Amer: 18 mL/min — ABNORMAL LOW (ref >60)
Glucose, Bld: 131 mg/dL — ABNORMAL HIGH (ref 70–99)
Potassium: 3.6 mmol/L (ref 3.5–5.1)
Sodium: 137 mmol/L (ref 135–145)

## 2019-08-01 LAB — IRON AND TIBC
Iron: 11 ug/dL — ABNORMAL LOW (ref 28–170)
Saturation Ratios: 5 % — ABNORMAL LOW (ref 10.4–31.8)
TIBC: 223 ug/dL — ABNORMAL LOW (ref 250–450)
UIBC: 212 ug/dL

## 2019-08-01 LAB — TRANSFERRIN: Transferrin: 158 mg/dL — ABNORMAL LOW (ref 192–382)

## 2019-08-01 NOTE — Progress Notes (Signed)
[facility]  ROUNDING NOTE   Subjective:  Patient still with considerable lower extremity edema. Appears to be tolerating Lasix drip well.   Objective:  Vital signs in last 24 hours:  Temp:  [98.2 F (36.8 C)-98.5 F (36.9 C)] 98.2 F (36.8 C) (07/24 1213) Pulse Rate:  [65-87] 65 (07/24 1213) Resp:  [17-20] 19 (07/24 1213) BP: (91-118)/(64-77) 91/77 (07/24 1213) SpO2:  [97 %-100 %] 100 % (07/24 1213) Weight:  [116.6 kg] 116.6 kg (07/24 0425)  Weight change: 0.775 kg Filed Weights   07/30/19 0816 07/31/19 0416 08/01/19 0425  Weight: 115.8 kg (!) 115.3 kg (!) 116.6 kg    Intake/Output: I/O last 3 completed shifts: In: 764 [P.O.:720; I.V.:44] Out: 3975 [Urine:3975]   Intake/Output this shift:  Total I/O In: 135.8 [I.V.:135.8] Out: 400 [Urine:400]  Physical Exam: General: NAD   Head: Normocephalic, atraumatic. Moist oral mucosal membranes  Eyes: Anicteric  Neck: Supple, trachea midline  Lungs:  Bilateral rales, normal effort  Heart: Regular rate and rhythm  Abdomen:  Soft, nontender, BS present  Extremities: 3+ peripheral edema.  Neurologic: Nonfocal, moving all four extremities  Skin: No lesions        Basic Metabolic Panel: Recent Labs  Lab 07/28/19 0543 07/28/19 0543 07/29/19 0736 07/29/19 0736 07/30/19 0608 07/31/19 0605 08/01/19 0533  NA 142  --  137  --  136 137 137  K 4.2  --  4.8  --  4.4 4.0 3.6  CL 108  --  103  --  104 106 102  CO2 26  --  20*  --  21* 27 25  GLUCOSE 122*  --  126*  --  130* 116* 131*  BUN 48*  --  49*  --  52* 54* 56*  CREATININE 2.04*  --  2.51*  --  2.96* 2.90* 2.66*  CALCIUM 8.9   < > 8.7*   < > 8.3* 8.4* 8.2*   < > = values in this interval not displayed.    Liver Function Tests: Recent Labs  Lab 07/27/19 1039  AST 24  ALT 17  ALKPHOS 184*  BILITOT 1.1  PROT 6.1*  ALBUMIN 2.7*   No results for input(s): LIPASE, AMYLASE in the last 168 hours. No results for input(s): AMMONIA in the last 168  hours.  CBC: Recent Labs  Lab 07/26/19 1631 07/28/19 0543 07/29/19 0736 08/01/19 0533  WBC 5.0 6.0 8.6 8.5  HGB 9.6* 9.3* 9.7* 9.1*  HCT 34.2* 31.5* 33.1* 31.0*  MCV 76.2* 72.2* 71.3* 71.3*  PLT 177 272 290 302    Cardiac Enzymes: No results for input(s): CKTOTAL, CKMB, CKMBINDEX, TROPONINI in the last 168 hours.  BNP: Invalid input(s): POCBNP  CBG: No results for input(s): GLUCAP in the last 168 hours.  Microbiology: Results for orders placed or performed during the hospital encounter of 06/19/19  SARS Coronavirus 2 by RT PCR (hospital order, performed in [facility] hospital lab) Nasopharyngeal Nasopharyngeal Swab     Status: None   Collection Time: 06/19/19  9:28 AM   Specimen: Nasopharyngeal Swab  Result Value Ref Range Status   SARS Coronavirus 2 NEGATIVE NEGATIVE Final    Comment: (NOTE) SARS-CoV-2 target nucleic acids are NOT DETECTED.  The SARS-CoV-2 RNA is generally detectable in upper and lower respiratory specimens during the acute phase of infection. The lowest concentration of SARS-CoV-2 viral copies this assay can detect is 250 copies / mL. A negative result does not preclude SARS-CoV-2 infection and should not be  used as the sole basis for treatment or other patient management decisions.  A negative result may occur with improper specimen collection / handling, submission of specimen other than nasopharyngeal swab, presence of viral mutation(s) within the areas targeted by this assay, and inadequate number of viral copies (<250 copies / mL). A negative result must be combined with clinical observations, patient history, and epidemiological information.  Fact Sheet for Patients:   StrictlyIdeas.no  Fact Sheet for Healthcare Providers: BankingDealers.co.za  This test is not yet approved or  cleared by the [place] FDA and has been authorized for detection and/or diagnosis of SARS-CoV-2 by FDA under  an Emergency Use Authorization (EUA).  This EUA will remain in effect (meaning this test can be used) for the duration of the COVID-19 declaration under Section 564(b)(1) of the Act, 21 U.S.C. section 360bbb-3(b)(1), unless the authorization is terminated or revoked sooner.  Performed at [facility], [address] [place], [place] [address]     Coagulation Studies: No results for input(s): LABPROT, INR in the last 72 hours.  Urinalysis: No results for input(s): COLORURINE, LABSPEC, PHURINE, GLUCOSEU, HGBUR, BILIRUBINUR, KETONESUR, PROTEINUR, UROBILINOGEN, NITRITE, LEUKOCYTESUR in the last 72 hours.  Invalid input(s): APPERANCEUR    Imaging: No results found.   Medications:   . sodium chloride    . furosemide (LASIX) infusion 4 mg/hr (08/01/19 1320)   . aspirin EC  81 mg Oral Daily  . atorvastatin  20 mg Oral Daily  . carvedilol  3.125 mg Oral BID WC  . FLUoxetine  20 mg Oral Daily  . rivaroxaban  20 mg Oral Q supper  . sodium chloride flush  3 mL Intravenous Once  . sodium chloride flush  3 mL Intravenous Q12H   sodium chloride, acetaminophen, ALPRAZolam, ipratropium-albuterol, sodium chloride flush, traMADol  Assessment/ Plan:  Ms. Tyshauna Finkbiner is a 64 y.o. black female with hypertension, hyperlipidemia, depression , polysubstance abuse, recent COVID-19 infection who was admitted to [facility] on 07/26/2019 for Acute CHF (congestive heart failure) ([place]) [I50.9] Acute on chronic congestive heart failure, unspecified heart failure type ([place]) [I50.9]  1. Acute renal failure on chronic kidney disease stage IIIB with proteinuria: baseline creatinine 1.7, GFR of 32 on 03/08/19. Suspect patient may have progression of chronic kidney disease from multiple episodes of acute kidney injury with limited recovery.  Chronic kidney disease has been thought to be secondary to hypertension in the past.  Acute renal failure secondary to acute cardiopulmonary syndrome.  No IV  contrast exposure. Renal ultrasound without obstruction.  -2.7 L of diuresis performed yesterday.  Creatinine improved down to 2.6 with an EGFR of 21.  Continue Lasix drip for now.  2. Hypertension with acute exacerbation of chronic systolic and diastolic congestive heart failure: echocardiogram on 06/22/19.  Home regimen of bumetanide, carvedilol, Entresto, and spironolactone.  -Tolerating Lasix drip well as above.  Continue current dosage.  3. Anemia with kidney failure: Monitor hemoglobin.  No indication for Procrit at the moment.   LOS: 5 Keghan Mcfarren 7/24/20211:48 PM

## 2019-08-01 NOTE — Progress Notes (Signed)
PROGRESS NOTE  Katelyn Lane  DOB: November 09, 1955  PCP: [name], MD DJS:[id number]  DOA: 07/26/2019  LOS: 5 days   Chief Complaint  Patient presents with  . Loss of Consciousness  . Leg Swelling   Brief narrative: 64 year old female with known history of chronic systolic heart failure, recent COVID-19 infection, hyperlipidemia and hypertension, stage IIIb chronic kidney disease, cocaine abuse who was recently admitted from her apartment and is currently homeless. Patient presented to the ED on 7/18 with acute onset of worsening dyspnea associated with dry cough wheezing, orthopnea and PND as well lower extremity edema for 1 week.  Patient had previous admissions with noncompliance.  In the ED, vital signs were stable. Labs showed BUN 52/creatinine 2.39.  BNP 4500.   Chest x-ray showed cardiomegaly and interstitial pulmonary edema.   Patient was given IV Lasix and admitted for CHF exacerbation.  Subjective: Patient was seen and examined this morning. Not on supplemental oxygen. Not short of breath. Trace to 1+ bilateral pedal edema remains. Most strikingly, she has severe tenderness in both lower extremities.  Assessment/Plan: Acute on chronic combined congestive heart failure Essential hypertension -Presented with worsening dyspnea, orthopnea PND for a week.  -Recent echocardiogram with ejection fraction 25-30 percent. -On IV Lasix drip. -Net diuresis of 5 L since admission. Does not seem consistent with documented weight. -Cardiology consult appreciated. Continue carvedilol and Aldactone. Entresto on hold due to to AKI.   -Continue to monitor blood pressure, renal function.  AKI on CKD stage IIIb with proteinuria -Baseline creatinine 1.7. Presented with creatinine of 2.39. Creatinine trended up with Lasix drip to peak at 2.96. Gradually trending down, 2.66 today.  -Nephrology consultation appreciated.  -Renal ultrasound 7/23 did not show any hydronephrosis or  retention.  Shows medical renal disease.  Chronic anemia -Hemoglobin stable between 9-10.  History of ventricular thrombus -Continue Xarelto.  Bilateral lower extremity pain -Unclear cause. At least need to to rule out DVT. Unfortunately patient is very compliant with Xarelto prior to presentation. -Ultrasound duplex ordered. If negative, may want to Neurontin.  Mobility: Encourage ambulation Code Status:   Code Status: Full Code  Nutritional status: Body mass index is 40.25 kg/m.     Diet Order            Diet 2 gram sodium Room service appropriate? Yes; Fluid consistency: Thin  Diet effective now                 DVT prophylaxis:  rivaroxaban (XARELTO) tablet 20 mg   Antimicrobials:  Fluid: None  Consultants: Cardiology, nephrology Family Communication:  None at bedside  Status is: Inpatient  Remains inpatient appropriate because:Persistent severe electrolyte disturbances and IV treatments appropriate due to intensity of illness or inability to take PO   Dispo: The patient is from: Homeless              Anticipated d/c is to: Likely shelter              Anticipated d/c date is: 2 days              Patient currently is not medically stable to d/c.       Infusions:  . sodium chloride    . furosemide (LASIX) infusion 4 mg/hr (08/01/19 0227)    Scheduled Meds: . aspirin EC  81 mg Oral Daily  . atorvastatin  20 mg Oral Daily  . carvedilol  3.125 mg Oral BID WC  . FLUoxetine  20 mg Oral Daily  .  rivaroxaban  20 mg Oral Q supper  . sodium chloride flush  3 mL Intravenous Once  . sodium chloride flush  3 mL Intravenous Q12H    Antimicrobials: Anti-infectives (From admission, onward)   None      PRN meds: sodium chloride, acetaminophen, ALPRAZolam, ipratropium-albuterol, sodium chloride flush, traMADol   Objective: Vitals:   08/01/19 0840 08/01/19 1213  BP: 104/75 91/77  Pulse: 68 65  Resp: 20 19  Temp: 98.2 F (36.8 C) 98.2 F (36.8 C)    SpO2: 97% 100%    Intake/Output Summary (Last 24 hours) at 08/01/2019 1300 Last data filed at 08/01/2019 0845 Gross per 24 hour  Intake 480 ml  Output 2875 ml  Net -2395 ml   Filed Weights   07/30/19 0816 07/31/19 0416 08/01/19 0425  Weight: 115.8 kg (!) 115.3 kg (!) 116.6 kg   Weight change: 0.775 kg Body mass index is 40.25 kg/m.   Physical Exam: General exam: Appears calm and comfortable. Not in distress at rest Skin: No rashes, lesions or ulcers. HEENT: Atraumatic, normocephalic, supple neck, no obvious bleeding Lungs: Clear to auscultation bilaterally CVS: Regular rate and rhythm, no murmur GI/Abd soft, nontender nondistended, bowel sound present CNS: Alert, awake, oriented x3 Psychiatry: Mood appropriate Extremities: Trace to 1+ bilateral pedal edema, generalized tenderness in both extremities  Data Review: I have personally reviewed the laboratory data and studies available.  Recent Labs  Lab 07/26/19 1631 07/28/19 0543 07/29/19 0736 08/01/19 0533  WBC 5.0 6.0 8.6 8.5  HGB 9.6* 9.3* 9.7* 9.1*  HCT 34.2* 31.5* 33.1* 31.0*  MCV 76.2* 72.2* 71.3* 71.3*  PLT 177 272 290 302   Recent Labs  Lab 07/28/19 0543 07/29/19 0736 07/30/19 0608 07/31/19 0605 08/01/19 0533  NA 142 137 136 137 137  K 4.2 4.8 4.4 4.0 3.6  CL 108 103 104 106 102  CO2 26 20* 21* 27 25  GLUCOSE 122* 126* 130* 116* 131*  BUN 48* 49* 52* 54* 56*  CREATININE 2.04* 2.51* 2.96* 2.90* 2.66*  CALCIUM 8.9 8.7* 8.3* 8.4* 8.2*    Signed, Terrilee Croak, MD [facility] Pager: [phone] (Secure Chat preferred). 08/01/2019

## 2019-08-01 NOTE — Progress Notes (Signed)
Patient remained safe from falls during shift despite her trying to get out of bed by herself to use bedside commode. Bed alarm in use at all times. She is a 2/3 person assist out of bed. Continues with Lasix infusion at 23mls/hr. Continues to have swelling in lower extremities and feet, albiet, with some improvement of symptoms. Patient to return home after improved CHF symptoms and ambulation.

## 2019-08-02 DIAGNOSIS — I5041 Acute combined systolic (congestive) and diastolic (congestive) heart failure: Secondary | ICD-10-CM | POA: Diagnosis not present

## 2019-08-02 LAB — CBC WITH DIFFERENTIAL/PLATELET
Abs Immature Granulocytes: 0.05 10*3/uL (ref 0.00–0.07)
Basophils Absolute: 0 10*3/uL (ref 0.0–0.1)
Basophils Relative: 1 %
Eosinophils Absolute: 0.2 10*3/uL (ref 0.0–0.5)
Eosinophils Relative: 4 %
HCT: 30.2 % — ABNORMAL LOW (ref 36.0–46.0)
Hemoglobin: 8.9 g/dL — ABNORMAL LOW (ref 12.0–15.0)
Immature Granulocytes: 1 %
Lymphocytes Relative: 14 %
Lymphs Abs: 0.9 10*3/uL (ref 0.7–4.0)
MCH: 21 pg — ABNORMAL LOW (ref 26.0–34.0)
MCHC: 29.5 g/dL — ABNORMAL LOW (ref 30.0–36.0)
MCV: 71.2 fL — ABNORMAL LOW (ref 80.0–100.0)
Monocytes Absolute: 0.9 10*3/uL (ref 0.1–1.0)
Monocytes Relative: 14 %
Neutro Abs: 4.3 10*3/uL (ref 1.7–7.7)
Neutrophils Relative %: 66 %
Platelets: 313 10*3/uL (ref 150–400)
RBC: 4.24 MIL/uL (ref 3.87–5.11)
RDW: 20.6 % — ABNORMAL HIGH (ref 11.5–15.5)
WBC: 6.4 10*3/uL (ref 4.0–10.5)
nRBC: 0.3 % — ABNORMAL HIGH (ref 0.0–0.2)

## 2019-08-02 LAB — BASIC METABOLIC PANEL
Anion gap: 9 (ref 5–15)
BUN: 50 mg/dL — ABNORMAL HIGH (ref 8–23)
CO2: 28 mmol/L (ref 22–32)
Calcium: 8.3 mg/dL — ABNORMAL LOW (ref 8.9–10.3)
Chloride: 100 mmol/L (ref 98–111)
Creatinine, Ser: 2.16 mg/dL — ABNORMAL HIGH (ref 0.44–1.00)
GFR calc Af Amer: 27 mL/min — ABNORMAL LOW (ref >60)
GFR calc non Af Amer: 24 mL/min — ABNORMAL LOW (ref >60)
Glucose, Bld: 106 mg/dL — ABNORMAL HIGH (ref 70–99)
Potassium: 3.7 mmol/L (ref 3.5–5.1)
Sodium: 137 mmol/L (ref 135–145)

## 2019-08-02 LAB — PHOSPHORUS: Phosphorus: 2.7 mg/dL (ref 2.5–4.6)

## 2019-08-02 LAB — MAGNESIUM: Magnesium: 1.7 mg/dL (ref 1.7–2.4)

## 2019-08-02 MED ORDER — GABAPENTIN 300 MG PO CAPS
300.0000 mg | ORAL_CAPSULE | Freq: Three times a day (TID) | ORAL | Status: DC
  Administered 2019-08-02 – 2019-09-02 (×90): 300 mg via ORAL
  Filled 2019-08-02 (×91): qty 1

## 2019-08-02 MED ORDER — POTASSIUM CHLORIDE CRYS ER 20 MEQ PO TBCR
40.0000 meq | EXTENDED_RELEASE_TABLET | Freq: Once | ORAL | Status: AC
  Administered 2019-08-02: 40 meq via ORAL
  Filled 2019-08-02: qty 2

## 2019-08-02 NOTE — Progress Notes (Signed)
PROGRESS NOTE  Katelyn Lane  DOB: Jul 06, 1955  PCP: [name], MD ZSW:[id number]  DOA: 07/26/2019  LOS: 6 days   Chief Complaint  Patient presents with  . Loss of Consciousness  . Leg Swelling   Brief narrative: 64 year old female with known history of chronic systolic heart failure, recent COVID-19 infection, hyperlipidemia and hypertension, stage IIIb chronic kidney disease, cocaine abuse who was recently admitted from her apartment and is currently homeless. Patient presented to the ED on 7/18 with acute onset of worsening dyspnea associated with dry cough wheezing, orthopnea and PND as well lower extremity edema for 1 week.  Patient had previous admissions with noncompliance.  In the ED, vital signs were stable. Labs showed BUN 52/creatinine 2.39.  BNP 4500.   Chest x-ray showed cardiomegaly and interstitial pulmonary edema.   Patient was given IV Lasix and admitted for CHF exacerbation.  Subjective: Patient was seen and examined this morning. Not on supplemental oxygen. Not short of breath.  Remains on Lasix drip. Net diuresis of 7.5 L since admission. Labs from this morning with creatinine improving at 2.16, potassium low at 3.7.  Assessment/Plan: Acute on chronic combined congestive heart failure Essential hypertension -Presented with worsening dyspnea, orthopnea PND for a week.  -Recent echocardiogram with ejection fraction 25-30 percent. -On IV Lasix drip at 4 mg/h. -Net diuresis of more than 7 L since admission. Does not seem consistent with documented weight. -Cardiology consult appreciated. Continue carvedilol and Aldactone. Entresto on hold due to to AKI.   -Continue to monitor blood pressure, renal function.  AKI on CKD stage IIIb with proteinuria Hypokalemia -Baseline creatinine of 1.7. Presented with creatinine of 2.39. Creatinine trended up with Lasix drip to peak at 2.96. Gradually trending down, 2.16 today.  -Potassium level low at 3.7 today.   Oral replacement with 40 mEq today.  Repeat labs tomorrow. -Nephrology consultation appreciated.  -Renal ultrasound 7/23 did not show any hydronephrosis or retention.  Shows medical renal disease.  Chronic anemia -Hemoglobin stable between 9-10.  History of ventricular thrombus -Continue Xarelto.  Bilateral feet pain -Unclear cause.  Unclear compliance with Xarelto. Ultrasound duplex of lower extremity obtained on 7/24, negative for DVT. -We will start her Neurontin 300 mg daily today.  Mobility: Encourage ambulation Code Status:   Code Status: Full Code  Nutritional status: Body mass index is 38.15 kg/m.     Diet Order            Diet 2 gram sodium Room service appropriate? Yes; Fluid consistency: Thin  Diet effective now                 DVT prophylaxis:  rivaroxaban (XARELTO) tablet 20 mg   Antimicrobials:  Fluid: None  Consultants: Cardiology, nephrology Family Communication:  None at bedside  Status is: Inpatient  Remains inpatient appropriate because:Persistent severe electrolyte disturbances and IV treatments appropriate due to intensity of illness or inability to take PO   Dispo: The patient is from: Homeless              Anticipated d/c is to: Likely shelter              Anticipated d/c date is: 2 days              Patient currently is not medically stable to d/c.    Infusions:  . sodium chloride    . furosemide (LASIX) infusion 4 mg/hr (08/01/19 1817)    Scheduled Meds: . aspirin EC  81 mg Oral  Daily  . atorvastatin  20 mg Oral Daily  . carvedilol  3.125 mg Oral BID WC  . FLUoxetine  20 mg Oral Daily  . gabapentin  300 mg Oral TID  . potassium chloride  40 mEq Oral Once  . rivaroxaban  20 mg Oral Q supper  . sodium chloride flush  3 mL Intravenous Once  . sodium chloride flush  3 mL Intravenous Q12H    Antimicrobials: Anti-infectives (From admission, onward)   None      PRN meds: sodium chloride, acetaminophen, ALPRAZolam, sodium  chloride flush, traMADol   Objective: Vitals:   08/02/19 0725 08/02/19 1136  BP: 109/84 (!) 110/94  Pulse: 77 78  Resp: 18 18  Temp: 97.9 F (36.6 C) 97.7 F (36.5 C)  SpO2: 100% 94%    Intake/Output Summary (Last 24 hours) at 08/02/2019 1238 Last data filed at 08/02/2019 1034 Gross per 24 hour  Intake 677.16 ml  Output 2500 ml  Net -1822.84 ml   Filed Weights   07/31/19 0416 08/01/19 0425 08/02/19 0345  Weight: (!) 115.3 kg (!) 116.6 kg (!) 110.5 kg   Weight change: -6.075 kg Body mass index is 38.15 kg/m.   Physical Exam: General exam: Appears calm and comfortable. Not in distress at rest Skin: No rashes, lesions or ulcers. HEENT: Atraumatic, normocephalic, supple neck, no obvious bleeding Lungs: Clear to auscultation bilaterally. CVS: Regular rate and rhythm, no murmur GI/Abd soft, nontender nondistended, bowel sound present CNS: Alert, awake, oriented x3 Psychiatry: Mood appropriate Extremities: Improving bilateral pedal edema.  Continues to have generalized soreness on both feet  Data Review: I have personally reviewed the laboratory data and studies available.  Recent Labs  Lab 07/26/19 1631 07/28/19 0543 07/29/19 0736 08/01/19 0533 08/02/19 0932  WBC 5.0 6.0 8.6 8.5 6.4  NEUTROABS  --   --   --   --  4.3  HGB 9.6* 9.3* 9.7* 9.1* 8.9*  HCT 34.2* 31.5* 33.1* 31.0* 30.2*  MCV 76.2* 72.2* 71.3* 71.3* 71.2*  PLT 177 272 290 302 313   Recent Labs  Lab 07/29/19 0736 07/30/19 0608 07/31/19 0605 08/01/19 0533 08/02/19 0932  NA 137 136 137 137 137  K 4.8 4.4 4.0 3.6 3.7  CL 103 104 106 102 100  CO2 20* 21* 27 25 28   GLUCOSE 126* 130* 116* 131* 106*  BUN 49* 52* 54* 56* 50*  CREATININE 2.51* 2.96* 2.90* 2.66* 2.16*  CALCIUM 8.7* 8.3* 8.4* 8.2* 8.3*  MG  --   --   --   --  1.7  PHOS  --   --   --   --  2.7    Signed, Terrilee Croak, MD [facility] Pager: [phone] (Secure Chat preferred). 08/02/2019

## 2019-08-02 NOTE — Progress Notes (Signed)
[facility]  ROUNDING NOTE   Subjective:  Patient seen and evaluated at bedside. Still on Lasix drip. Patient still has lower extremity edema.   Objective:  Vital signs in last 24 hours:  Temp:  [97.7 F (36.5 C)-98.6 F (37 C)] 97.8 F (36.6 C) (07/25 1453) Pulse Rate:  [71-83] 80 (07/25 1453) Resp:  [17-19] 17 (07/25 1453) BP: (106-116)/(77-94) 115/86 (07/25 1453) SpO2:  [94 %-100 %] 100 % (07/25 1453) Weight:  [110.5 kg] 110.5 kg (07/25 0345)  Weight change: -6.075 kg Filed Weights   07/31/19 0416 08/01/19 0425 08/02/19 0345  Weight: (!) 115.3 kg (!) 116.6 kg (!) 110.5 kg    Intake/Output: I/O last 3 completed shifts: In: 197.2 [I.V.:197.2] Out: 4300 [ZHYQM:[id number]]   Intake/Output this shift:  Total I/O In: 519.9 [P.O.:480; I.V.:39.9] Out: 200 [Urine:200]  Physical Exam: General: NAD   Head: Normocephalic, atraumatic. Moist oral mucosal membranes  Eyes: Anicteric  Neck: Supple, trachea midline  Lungs:  Bilateral rales, normal effort  Heart: Regular rate and rhythm  Abdomen:  Soft, nontender, BS present  Extremities: 2+ peripheral edema.  Neurologic: Nonfocal, moving all four extremities  Skin: No lesions        Basic Metabolic Panel: Recent Labs  Lab 07/29/19 0736 07/29/19 0736 07/30/19 0608 07/30/19 0608 07/31/19 0605 08/01/19 0533 08/02/19 0932  NA 137  --  136  --  137 137 137  K 4.8  --  4.4  --  4.0 3.6 3.7  CL 103  --  104  --  106 102 100  CO2 20*  --  21*  --  _0 GLUCOSE 126*  --  130*  --  116* 131* 106*  BUN 49*  --  52*  --  54* 56* 50*  CREATININE 2.51*  --  2.96*  --  2.90* 2.66* 2.16*  CALCIUM 8.7*   < > 8.3*   < > 8.4* 8.2* 8.3*  MG  --   --   --   --   --   --  1.7  PHOS  --   --   --   --   --   --  2.7   < > = values in this interval not displayed.    Liver Function Tests: Recent Labs  Lab 07/27/19 1039  AST 24  ALT 17  ALKPHOS 184*  BILITOT 1.1  PROT 6.1*  ALBUMIN 2.7*   No results for  input(s): LIPASE, AMYLASE in the last 168 hours. No results for input(s): AMMONIA in the last 168 hours.  CBC: Recent Labs  Lab 07/28/19 0543 07/29/19 0736 08/01/19 0533 08/02/19 0932  WBC 6.0 8.6 8.5 6.4  NEUTROABS  --   --   --  4.3  HGB 9.3* 9.7* 9.1* 8.9*  HCT 31.5* 33.1* 31.0* 30.2*  MCV 72.2* 71.3* 71.3* 71.2*  PLT 272 290 302 313    Cardiac Enzymes: No results for input(s): CKTOTAL, CKMB, CKMBINDEX, TROPONINI in the last 168 hours.  BNP: Invalid input(s): POCBNP  CBG: No results for input(s): GLUCAP in the last 168 hours.  Microbiology: Results for orders placed or performed during the hospital encounter of 06/19/19  SARS Coronavirus 2 by RT PCR (hospital order, performed in [facility] hospital lab) Nasopharyngeal Nasopharyngeal Swab     Status: None   Collection Time: 06/19/19  9:28 AM   Specimen: Nasopharyngeal Swab  Result Value Ref Range Status   SARS Coronavirus 2 NEGATIVE NEGATIVE Final  Comment: (NOTE) SARS-CoV-2 target nucleic acids are NOT DETECTED.  The SARS-CoV-2 RNA is generally detectable in upper and lower respiratory specimens during the acute phase of infection. The lowest concentration of SARS-CoV-2 viral copies this assay can detect is 250 copies / mL. A negative result does not preclude SARS-CoV-2 infection and should not be used as the sole basis for treatment or other patient management decisions.  A negative result may occur with improper specimen collection / handling, submission of specimen other than nasopharyngeal swab, presence of viral mutation(s) within the areas targeted by this assay, and inadequate number of viral copies (<250 copies / mL). A negative result must be combined with clinical observations, patient history, and epidemiological information.  Fact Sheet for Patients:   StrictlyIdeas.no  Fact Sheet for Healthcare Providers: BankingDealers.co.za  This test is not yet  approved or  cleared by the [place] FDA and has been authorized for detection and/or diagnosis of SARS-CoV-2 by FDA under an Emergency Use Authorization (EUA).  This EUA will remain in effect (meaning this test can be used) for the duration of the COVID-19 declaration under Section 564(b)(1) of the Act, 21 U.S.C. section 360bbb-3(b)(1), unless the authorization is terminated or revoked sooner.  Performed at [facility], [address] [place], [place] [address]     Coagulation Studies: No results for input(s): LABPROT, INR in the last 72 hours.  Urinalysis: No results for input(s): COLORURINE, LABSPEC, PHURINE, GLUCOSEU, HGBUR, BILIRUBINUR, KETONESUR, PROTEINUR, UROBILINOGEN, NITRITE, LEUKOCYTESUR in the last 72 hours.  Invalid input(s): APPERANCEUR    Imaging: US Venous Img Lower Bilateral (DVT)  Result Date: 08/01/2019 CLINICAL DATA:  Pain, pitting edema. History tobacco abuse. On Xarelto EXAM: BILATERAL LOWER EXTREMITY VENOUS DOPPLER ULTRASOUND TECHNIQUE: Gray-scale sonography with compression, as well as color and duplex ultrasound, were performed to evaluate the deep venous system(s) from the level of the common femoral vein through the popliteal and proximal calf veins. COMPARISON:  None. FINDINGS: VENOUS Normal compressibility of the common femoral, superficial femoral, and popliteal veins, as well as the visualized calf veins. Visualized portions of profunda femoral vein and great saphenous vein unremarkable. No filling defects to suggest DVT on grayscale or color Doppler imaging. Doppler waveforms show normal direction of venous flow, normal respiratory phasicity and response to augmentation. OTHER Subcutaneous edema in the calves, right greater than left. Limitations: none IMPRESSION: No femoropopliteal DVT nor evidence of DVT within the visualized calf veins. If clinical symptoms are inconsistent or if there are persistent or worsening symptoms, further imaging  (possibly involving the iliac veins) may be warranted. Electronically Signed   By: Lucrezia Europe M.D.   On: 08/01/2019 15:16     Medications:   . sodium chloride    . furosemide (LASIX) infusion 4 mg/hr (08/02/19 1517)   . aspirin EC  81 mg Oral Daily  . atorvastatin  20 mg Oral Daily  . carvedilol  3.125 mg Oral BID WC  . FLUoxetine  20 mg Oral Daily  . gabapentin  300 mg Oral TID  . rivaroxaban  20 mg Oral Q supper  . sodium chloride flush  3 mL Intravenous Once  . sodium chloride flush  3 mL Intravenous Q12H   sodium chloride, acetaminophen, ALPRAZolam, sodium chloride flush, traMADol  Assessment/ Plan:  [name] is a 64 y.o. black female with hypertension, hyperlipidemia, depression , polysubstance abuse, recent COVID-19 infection who was admitted to [facility] on 07/26/2019 for Acute CHF (congestive heart failure) ([place]) [I50.9] Acute on chronic  congestive heart failure, unspecified heart failure type ([place]) [I50.9]  1. Acute renal failure on chronic kidney disease stage IIIB with proteinuria: baseline creatinine 1.7, GFR of 32 on 03/08/19. Suspect patient may have progression of chronic kidney disease from multiple episodes of acute kidney injury with limited recovery.  Chronic kidney disease has been thought to be secondary to hypertension in the past.  Acute renal failure secondary to acute cardiopulmonary syndrome.  No IV contrast exposure. Renal ultrasound without obstruction.  - patient continues to have good urine output of 2.9 L over the preceding 24 hours.  Renal function actually improved.  Creatinine down to 2.16 with an EGFR 27.  Increase Lasix drip to 6 mg/h..  2. Hypertension with acute exacerbation of chronic systolic and diastolic congestive heart failure: echocardiogram on 06/22/19.  Home regimen of bumetanide, carvedilol, Entresto, and spironolactone.  -Increase Lasix drip to 6 mg/h.  3. Anemia with kidney failure: Hemoglobin down a bit to 8.9.  Consider Epogen as  an outpatient.   LOS: 6 Xaviar Lunn 7/25/20215:01 PM

## 2019-08-02 NOTE — Plan of Care (Signed)
  Problem: Education: Goal: Knowledge of General Education information will improve Description Including pain rating scale, medication(s)/side effects and non-pharmacologic comfort measures Outcome: Progressing   

## 2019-08-02 NOTE — Progress Notes (Signed)
Pt c/o sob. Offered her Duoneb SVN which she refused and states she will not take any SVN's. She is in [facility] primarily for CHF.

## 2019-08-03 DIAGNOSIS — I5041 Acute combined systolic (congestive) and diastolic (congestive) heart failure: Secondary | ICD-10-CM | POA: Diagnosis not present

## 2019-08-03 LAB — CBC WITH DIFFERENTIAL/PLATELET
Abs Immature Granulocytes: 0 10*3/uL (ref 0.00–0.07)
Basophils Absolute: 0 10*3/uL (ref 0.0–0.1)
Basophils Relative: 0 %
Eosinophils Absolute: 0.5 10*3/uL (ref 0.0–0.5)
Eosinophils Relative: 7 %
HCT: 31.6 % — ABNORMAL LOW (ref 36.0–46.0)
Hemoglobin: 9.2 g/dL — ABNORMAL LOW (ref 12.0–15.0)
Lymphocytes Relative: 12 %
Lymphs Abs: 0.9 10*3/uL (ref 0.7–4.0)
MCH: 20.9 pg — ABNORMAL LOW (ref 26.0–34.0)
MCHC: 29.1 g/dL — ABNORMAL LOW (ref 30.0–36.0)
MCV: 71.7 fL — ABNORMAL LOW (ref 80.0–100.0)
Monocytes Absolute: 1 10*3/uL (ref 0.1–1.0)
Monocytes Relative: 13 %
Neutro Abs: 5.2 10*3/uL (ref 1.7–7.7)
Neutrophils Relative %: 68 %
Platelets: 320 10*3/uL (ref 150–400)
RBC: 4.41 MIL/uL (ref 3.87–5.11)
RDW: 20.2 % — ABNORMAL HIGH (ref 11.5–15.5)
Smear Review: NORMAL
WBC: 7.7 10*3/uL (ref 4.0–10.5)
nRBC: 0.4 % — ABNORMAL HIGH (ref 0.0–0.2)

## 2019-08-03 LAB — BASIC METABOLIC PANEL
Anion gap: 7 (ref 5–15)
BUN: 49 mg/dL — ABNORMAL HIGH (ref 8–23)
CO2: 31 mmol/L (ref 22–32)
Calcium: 8 mg/dL — ABNORMAL LOW (ref 8.9–10.3)
Chloride: 101 mmol/L (ref 98–111)
Creatinine, Ser: 1.86 mg/dL — ABNORMAL HIGH (ref 0.44–1.00)
GFR calc Af Amer: 33 mL/min — ABNORMAL LOW (ref >60)
GFR calc non Af Amer: 28 mL/min — ABNORMAL LOW (ref >60)
Glucose, Bld: 125 mg/dL — ABNORMAL HIGH (ref 70–99)
Potassium: 3.5 mmol/L (ref 3.5–5.1)
Sodium: 139 mmol/L (ref 135–145)

## 2019-08-03 LAB — KAPPA/LAMBDA LIGHT CHAINS
Kappa free light chain: 126.2 mg/L — ABNORMAL HIGH (ref 3.3–19.4)
Kappa, lambda light chain ratio: 1.41 (ref 0.26–1.65)
Lambda free light chains: 89.5 mg/L — ABNORMAL HIGH (ref 5.7–26.3)

## 2019-08-03 MED ORDER — ALBUMIN HUMAN 25 % IV SOLN
12.5000 g | Freq: Every day | INTRAVENOUS | Status: DC
  Administered 2019-08-03 – 2019-08-06 (×4): 12.5 g via INTRAVENOUS
  Filled 2019-08-03 (×4): qty 50

## 2019-08-03 NOTE — Progress Notes (Signed)
Mobility Specialist - Progress Note   08/03/19 1413  Mobility  Activity Dangled on edge of bed (sit-to-stand 1x)  Level of Assistance Moderate assist, patient does 50-74% (max assist +2 for sit-to-stand)  Assistive Device Front wheel walker  Mobility Response Tolerated well  Mobility performed by Mobility specialist  $Mobility charge 1 Mobility    Pre-mobility: 66 HR, 109/89 BP, 98% SpO2 During mobility: 76 HR, 100% SpO2 Post-mobility: 77 HR, 93/49 BP, 100% SpO2   Pt was in bed with nurse present in room upon arrival. At first, pt told nurse she refuses to be seen for mobility. Nurse educated pt on how mobility is beneficial. Therefore, pt agreed for session. Pt did not c/o any pain until she started to transition to EOB. She c/o pain in LLE but did not specify where. Pt able to transition to EOB mod assist, needing help with getting her left leg over the bed. Pt needed max assist +2 sit-to-stand using RW. Pt claims to be able to ambulate and transfer to [facility] with set-up and SBA. However, based on this session, pain in LLE limited mobility and limited the amount of time pt could stay standing. Pt voluntarily performed EOB exercises (seated marching, seated kicks) to increase mobility. Overall, pt tolerated session well. She needed mod A getting back to bed. She was left in bed with call bell and phone in reach, and alarm set. Doctor came in at the end of session. Nurse was notified.     Bronwen Pendergraft Mobility Specialist  08/03/19, 2:30 PM

## 2019-08-03 NOTE — Progress Notes (Signed)
PROGRESS NOTE    Katelyn Lane  UKG:[id number] DOB: 11/11/1955 DOA: 07/26/2019 PCP: [name], MD    Brief Narrative:  64 year old female with known history of chronic systolic heart failure, recent COVID-19 infection, hyperlipidemia and hypertension, stage IIIb chronic kidney disease presented to the emergency room with acute onset of worsening dyspnea associated with dry cough wheezing, orthopnea and PND as well lower extremity edema for 1 week.  Patient had previous admissions with noncompliance.  She smokes cocaine and she is unfortunately homeless. At the emergency room vitals were stable.  BUN 52/creatinine 2.39.  BNP 4500.  Chest x-ray showed cardiomegaly and interstitial pulmonary edema.  Patient was given IV Lasix and admission requested for CHF exacerbation.   Assessment & Plan:   Active Problems:   Acute CHF (congestive heart failure) (HCC)  Acute on chronic combined congestive heart failure:  Recent echocardiogram with ejection fraction 25%. Patient was admitted and started on IV Lasix, inadequate diuresis with advanced kidney disease. Started on IV Lasix infusion, currently remains on 6 mg/h.  Excellent diuresis, -9 L. Continue carvedilol and Aldactone.  Hold Entresto secondary to AKI.  Appreciate cardiology follow-up. Renal ultrasound stable. does not show any hydronephrosis or retention.  Shows medical renal disease.  AKI on CKD stage IIIb: Likely secondary to above.  Treated with IV diuresis with improvement.  Creatinine less than 2.  Excellent urine output.   Anemia of chronic disease: Hemoglobin low but is stable.  Hypertension: On carvedilol.  Entresto on hold.  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  Start mobilizing.  Will wait for nephrology to change to oral diuretics to prepare for discharge.   DVT prophylaxis:  rivaroxaban (XARELTO) tablet 20 mg   Code Status: Full code Family Communication: None Disposition Plan: Status is:  Inpatient  Remains inpatient appropriate because:IV treatments appropriate due to intensity of illness or inability to take PO   Dispo: The patient is from: Home              Anticipated d/c is to: Home              Anticipated d/c date is: 2 days.              Patient currently is not medically stable to d/c.         Consultants:   Cardiology  Nephrology  Procedures:   None   Antimicrobials:   None     Subjective: Patient seen and examined.  Denies any complaints.  She has no home to go.  Legs are still painful but swelling is less. Objective: Vitals:   08/02/19 2012 08/03/19 0534 08/03/19 0745 08/03/19 1148  BP: 106/74 102/70 98/75 106/80  Pulse: 65 69 75 73  Resp:   18 18  Temp: 98.5 F (36.9 C) (!) 97.5 F (36.4 C) 97.9 F (36.6 C) 98.1 F (36.7 C)  TempSrc: Oral Oral Oral Oral  SpO2: 100% 99% 100% 99%  Weight:  (!) 112 kg    Height:        Intake/Output Summary (Last 24 hours) at 08/03/2019 1333 Last data filed at 08/03/2019 1149 Gross per 24 hour  Intake 1905.38 ml  Output 2950 ml  Net -1044.62 ml   Filed Weights   08/01/19 0425 08/02/19 0345 08/03/19 0534  Weight: (!) 116.6 kg (!) 110.5 kg (!) 112 kg    Examination:  Physical Exam Constitutional:      Appearance: Normal appearance.  HENT:     Head: Atraumatic.  Eyes:     Pupils: Pupils are equal, round, and reactive to light.  Neck:     Comments: Unable to appreciate JVD. Cardiovascular:     Rate and Rhythm: Regular rhythm.  Pulmonary:     Breath sounds: Normal breath sounds.     Comments: B/l basal crackles and poor air entry  Musculoskeletal:        General: Swelling present.     Right lower leg: Edema present.     Left lower leg: Edema present.  Neurological:     Mental Status: She is oriented to person, place, and time.   She has tenderness extremities.  Still has edema 2+, less than before.    Data Reviewed: I have personally reviewed following labs and imaging  studies  CBC: Recent Labs  Lab 07/28/19 0543 07/29/19 0736 08/01/19 0533 08/02/19 0932 08/03/19 0523  WBC 6.0 8.6 8.5 6.4 7.7  NEUTROABS  --   --   --  4.3 5.2  HGB 9.3* 9.7* 9.1* 8.9* 9.2*  HCT 31.5* 33.1* 31.0* 30.2* 31.6*  MCV 72.2* 71.3* 71.3* 71.2* 71.7*  PLT 272 290 302 313 151   Basic Metabolic Panel: Recent Labs  Lab 07/30/19 0608 07/31/19 0605 08/01/19 0533 08/02/19 0932 08/03/19 0523  NA 136 137 137 137 139  K 4.4 4.0 3.6 3.7 3.5  CL 104 106 102 100 101  CO2 21* 27 25 28 31   GLUCOSE 130* 116* 131* 106* 125*  BUN 52* 54* 56* 50* 49*  CREATININE 2.96* 2.90* 2.66* 2.16* 1.86*  CALCIUM 8.3* 8.4* 8.2* 8.3* 8.0*  MG  --   --   --  1.7  --   PHOS  --   --   --  2.7  --    GFR: Estimated Creatinine Clearance: 40 mL/min (A) (by C-G formula based on SCr of 1.86 mg/dL (H)). Liver Function Tests: No results for input(s): AST, ALT, ALKPHOS, BILITOT, PROT, ALBUMIN in the last 168 hours. No results for input(s): LIPASE, AMYLASE in the last 168 hours. No results for input(s): AMMONIA in the last 168 hours. Coagulation Profile: No results for input(s): INR, PROTIME in the last 168 hours. Cardiac Enzymes: No results for input(s): CKTOTAL, CKMB, CKMBINDEX, TROPONINI in the last 168 hours. BNP (last 3 results) No results for input(s): PROBNP in the last 8760 hours. HbA1C: No results for input(s): HGBA1C in the last 72 hours. CBG: No results for input(s): GLUCAP in the last 168 hours. Lipid Profile: No results for input(s): CHOL, HDL, LDLCALC, TRIG, CHOLHDL, LDLDIRECT in the last 72 hours. Thyroid Function Tests: No results for input(s): TSH, T4TOTAL, FREET4, T3FREE, THYROIDAB in the last 72 hours. Anemia Panel: Recent Labs    08/01/19 0533  TIBC 223*  IRON 11*   Sepsis Labs: No results for input(s): PROCALCITON, LATICACIDVEN in the last 168 hours.  No results found for this or any previous visit (from the past 240 hour(s)).       Radiology Studies: US  Venous Img Lower Bilateral (DVT)  Result Date: 08/01/2019 CLINICAL DATA:  Pain, pitting edema. History tobacco abuse. On Xarelto EXAM: BILATERAL LOWER EXTREMITY VENOUS DOPPLER ULTRASOUND TECHNIQUE: Gray-scale sonography with compression, as well as color and duplex ultrasound, were performed to evaluate the deep venous system(s) from the level of the common femoral vein through the popliteal and proximal calf veins. COMPARISON:  None. FINDINGS: VENOUS Normal compressibility of the common femoral, superficial femoral, and popliteal veins, as well as the visualized calf veins. Visualized portions  of profunda femoral vein and great saphenous vein unremarkable. No filling defects to suggest DVT on grayscale or color Doppler imaging. Doppler waveforms show normal direction of venous flow, normal respiratory phasicity and response to augmentation. OTHER Subcutaneous edema in the calves, right greater than left. Limitations: none IMPRESSION: No femoropopliteal DVT nor evidence of DVT within the visualized calf veins. If clinical symptoms are inconsistent or if there are persistent or worsening symptoms, further imaging (possibly involving the iliac veins) may be warranted. Electronically Signed   By: Lucrezia Europe M.D.   On: 08/01/2019 15:16        Scheduled Meds: . aspirin EC  81 mg Oral Daily  . atorvastatin  20 mg Oral Daily  . carvedilol  3.125 mg Oral BID WC  . FLUoxetine  20 mg Oral Daily  . gabapentin  300 mg Oral TID  . rivaroxaban  20 mg Oral Q supper  . sodium chloride flush  3 mL Intravenous Once  . sodium chloride flush  3 mL Intravenous Q12H   Continuous Infusions: . sodium chloride    . furosemide (LASIX) infusion 6 mg/hr (08/03/19 1317)     LOS: 7 days    Time spent: 30 minutes    Barb Merino, MD [facility] Pager [phone]

## 2019-08-03 NOTE — Progress Notes (Signed)
[facility]  ROUNDING NOTE   Subjective:   Good UOP 07/25 0701 - 07/26 0700 In: 1185.4 [P.O.:1080; I.V.:105.4] Out: 2600 [Urine:2600] States she is able to eat without nausea or vomiting Still has large amount of lower extremity edema Continued on IV Lasix infusion Worried about discharge after the hospital states she has no vertigo  Objective:  Vital signs in last 24 hours:  Temp:  [97.5 F (36.4 C)-98.5 F (36.9 C)] 97.9 F (36.6 C) (07/26 0745) Pulse Rate:  [65-80] 75 (07/26 0745) Resp:  [17-18] 18 (07/26 0745) BP: (98-115)/(70-94) 98/75 (07/26 0745) SpO2:  [94 %-100 %] 100 % (07/26 0745) Weight:  [741 kg] 112 kg (07/26 0534)  Weight change: 1.539 kg Filed Weights   08/01/19 0425 08/02/19 0345 08/03/19 0534  Weight: (!) 116.6 kg (!) 110.5 kg (!) 112 kg    Intake/Output: I/O last 3 completed shifts: In: 1224.1 [P.O.:1080; I.V.:144.1] Out: 4500 [Urine:4500]   Intake/Output this shift:  Total I/O In: 960 [P.O.:960] Out: -   Physical Exam: General: NAD   Head: Normocephalic, atraumatic. Moist oral mucosal membranes  Eyes: Anicteric  Neck: Supple, trachea midline  Lungs:  Bilateral rales, normal effort  Heart: Regular rate and rhythm  Abdomen:  Soft, nontender, BS present  Extremities: 2+ peripheral edema.  Neurologic: Nonfocal, moving all four extremities  Skin: No lesions        Basic Metabolic Panel: Recent Labs  Lab 07/30/19 0608 07/30/19 0608 07/31/19 0605 07/31/19 0605 08/01/19 0533 08/02/19 0932 08/03/19 0523  NA 136  --  137  --  137 137 139  K 4.4  --  4.0  --  3.6 3.7 3.5  CL 104  --  106  --  102 100 101  CO2 21*  --  27  --  '25 28 31  '$ GLUCOSE 130*  --  116*  --  131* 106* 125*  BUN 52*  --  54*  --  56* 50* 49*  CREATININE 2.96*  --  2.90*  --  2.66* 2.16* 1.86*  CALCIUM 8.3*   < > 8.4*   < > 8.2* 8.3* 8.0*  MG  --   --   --   --   --  1.7  --   PHOS  --   --   --   --   --  2.7  --    < > = values in this interval  not displayed.    Liver Function Tests: No results for input(s): AST, ALT, ALKPHOS, BILITOT, PROT, ALBUMIN in the last 168 hours. No results for input(s): LIPASE, AMYLASE in the last 168 hours. No results for input(s): AMMONIA in the last 168 hours.  CBC: Recent Labs  Lab 07/28/19 0543 07/29/19 0736 08/01/19 0533 08/02/19 0932 08/03/19 0523  WBC 6.0 8.6 8.5 6.4 7.7  NEUTROABS  --   --   --  4.3 5.2  HGB 9.3* 9.7* 9.1* 8.9* 9.2*  HCT 31.5* 33.1* 31.0* 30.2* 31.6*  MCV 72.2* 71.3* 71.3* 71.2* 71.7*  PLT 272 290 302 313 320    Cardiac Enzymes: No results for input(s): CKTOTAL, CKMB, CKMBINDEX, TROPONINI in the last 168 hours.  BNP: Invalid input(s): POCBNP  CBG: No results for input(s): GLUCAP in the last 168 hours.  Microbiology: Results for orders placed or performed during the hospital encounter of 06/19/19  SARS Coronavirus 2 by RT PCR (hospital order, performed in [facility] hospital lab) Nasopharyngeal Nasopharyngeal Swab     Status: None  Collection Time: 06/19/19  9:28 AM   Specimen: Nasopharyngeal Swab  Result Value Ref Range Status   SARS Coronavirus 2 NEGATIVE NEGATIVE Final    Comment: (NOTE) SARS-CoV-2 target nucleic acids are NOT DETECTED.  The SARS-CoV-2 RNA is generally detectable in upper and lower respiratory specimens during the acute phase of infection. The lowest concentration of SARS-CoV-2 viral copies this assay can detect is 250 copies / mL. A negative result does not preclude SARS-CoV-2 infection and should not be used as the sole basis for treatment or other patient management decisions.  A negative result may occur with improper specimen collection / handling, submission of specimen other than nasopharyngeal swab, presence of viral mutation(s) within the areas targeted by this assay, and inadequate number of viral copies (<250 copies / mL). A negative result must be combined with clinical observations, patient history, and epidemiological  information.  Fact Sheet for Patients:   StrictlyIdeas.no  Fact Sheet for Healthcare Providers: BankingDealers.co.za  This test is not yet approved or  cleared by the [place] FDA and has been authorized for detection and/or diagnosis of SARS-CoV-2 by FDA under an Emergency Use Authorization (EUA).  This EUA will remain in effect (meaning this test can be used) for the duration of the COVID-19 declaration under Section 564(b)(1) of the Act, 21 U.S.C. section 360bbb-3(b)(1), unless the authorization is terminated or revoked sooner.  Performed at [facility], [address] [place], [place] [address]     Coagulation Studies: No results for input(s): LABPROT, INR in the last 72 hours.  Urinalysis: No results for input(s): COLORURINE, LABSPEC, PHURINE, GLUCOSEU, HGBUR, BILIRUBINUR, KETONESUR, PROTEINUR, UROBILINOGEN, NITRITE, LEUKOCYTESUR in the last 72 hours.  Invalid input(s): APPERANCEUR    Imaging: US Venous Img Lower Bilateral (DVT)  Result Date: 08/01/2019 CLINICAL DATA:  Pain, pitting edema. History tobacco abuse. On Xarelto EXAM: BILATERAL LOWER EXTREMITY VENOUS DOPPLER ULTRASOUND TECHNIQUE: Gray-scale sonography with compression, as well as color and duplex ultrasound, were performed to evaluate the deep venous system(s) from the level of the common femoral vein through the popliteal and proximal calf veins. COMPARISON:  None. FINDINGS: VENOUS Normal compressibility of the common femoral, superficial femoral, and popliteal veins, as well as the visualized calf veins. Visualized portions of profunda femoral vein and great saphenous vein unremarkable. No filling defects to suggest DVT on grayscale or color Doppler imaging. Doppler waveforms show normal direction of venous flow, normal respiratory phasicity and response to augmentation. OTHER Subcutaneous edema in the calves, right greater than left. Limitations: none  IMPRESSION: No femoropopliteal DVT nor evidence of DVT within the visualized calf veins. If clinical symptoms are inconsistent or if there are persistent or worsening symptoms, further imaging (possibly involving the iliac veins) may be warranted. Electronically Signed   By: Lucrezia Europe M.D.   On: 08/01/2019 15:16     Medications:   . sodium chloride    . furosemide (LASIX) infusion 6 mg/hr (08/02/19 1746)   . aspirin EC  81 mg Oral Daily  . atorvastatin  20 mg Oral Daily  . carvedilol  3.125 mg Oral BID WC  . FLUoxetine  20 mg Oral Daily  . gabapentin  300 mg Oral TID  . rivaroxaban  20 mg Oral Q supper  . sodium chloride flush  3 mL Intravenous Once  . sodium chloride flush  3 mL Intravenous Q12H   sodium chloride, acetaminophen, ALPRAZolam, sodium chloride flush, traMADol  Assessment/ Plan:  [name] is a 63 y.o. black female  with hypertension, hyperlipidemia, depression , polysubstance abuse, recent COVID-19 infection who was admitted to [facility] on 07/26/2019 for Acute CHF (congestive heart failure) ([place]) [I50.9] Acute on chronic congestive heart failure, unspecified heart failure type ([place]) [I50.9]  1. Acute renal failure on chronic kidney disease stage IIIB with proteinuria: baseline creatinine 1.7, GFR of 32 on 03/08/19. Suspect patient may have progression of chronic kidney disease from multiple episodes of acute kidney injury with limited recovery.  Chronic kidney disease has been thought to be secondary to hypertension in the past.  Acute renal failure secondary to acute cardiopulmonary syndrome.  No IV contrast exposure. Renal ultrasound without obstruction.  Shows diffuse increased echogenicity of the bilateral kidneys.  - patient continues to have good urine output of 2.9 L over the preceding 24 hours.  Renal function actually improved.  Creatinine down to 2.16 with an EGFR 27.  Increase Lasix drip to 6 mg/h..  2. Acute exacerbation of chronic systolic and diastolic  congestive heart failure and generalized edema: echocardiogram on 06/22/19.  EF of 25 to 88%, grade 3 diastolic dysfunction.  Moderately elevated pulmonary artery systolic pressure, right ventricle severely enlarged.  Estimated RVSP 60 mm suggesting pulmonary hypertension Home regimen of bumetanide, carvedilol, Entresto, and spironolactone. On hold at present -Continue IV Lasix infusion.  Add IV albumin supplementation - avoid hypotension    LOS: 7 Cherisa Brucker 7/26/202110:59 AM

## 2019-08-04 DIAGNOSIS — I5041 Acute combined systolic (congestive) and diastolic (congestive) heart failure: Secondary | ICD-10-CM | POA: Diagnosis not present

## 2019-08-04 LAB — CBC WITH DIFFERENTIAL/PLATELET
Abs Immature Granulocytes: 0.03 10*3/uL (ref 0.00–0.07)
Basophils Absolute: 0 10*3/uL (ref 0.0–0.1)
Basophils Relative: 1 %
Eosinophils Absolute: 0.1 10*3/uL (ref 0.0–0.5)
Eosinophils Relative: 2 %
HCT: 27 % — ABNORMAL LOW (ref 36.0–46.0)
Hemoglobin: 8.4 g/dL — ABNORMAL LOW (ref 12.0–15.0)
Immature Granulocytes: 0 %
Lymphocytes Relative: 14 %
Lymphs Abs: 1.2 10*3/uL (ref 0.7–4.0)
MCH: 21.3 pg — ABNORMAL LOW (ref 26.0–34.0)
MCHC: 31.1 g/dL (ref 30.0–36.0)
MCV: 68.4 fL — ABNORMAL LOW (ref 80.0–100.0)
Monocytes Absolute: 1.2 10*3/uL — ABNORMAL HIGH (ref 0.1–1.0)
Monocytes Relative: 15 %
Neutro Abs: 5.5 10*3/uL (ref 1.7–7.7)
Neutrophils Relative %: 68 %
Platelets: 314 10*3/uL (ref 150–400)
RBC: 3.95 MIL/uL (ref 3.87–5.11)
RDW: 20.3 % — ABNORMAL HIGH (ref 11.5–15.5)
WBC: 8.1 10*3/uL (ref 4.0–10.5)
nRBC: 0 % (ref 0.0–0.2)

## 2019-08-04 LAB — PROTEIN ELECTROPHORESIS, SERUM
A/G Ratio: 0.7 (ref 0.7–1.7)
Albumin ELP: 2.1 g/dL — ABNORMAL LOW (ref 2.9–4.4)
Alpha-1-Globulin: 0.3 g/dL (ref 0.0–0.4)
Alpha-2-Globulin: 0.6 g/dL (ref 0.4–1.0)
Beta Globulin: 0.9 g/dL (ref 0.7–1.3)
Gamma Globulin: 1.1 g/dL (ref 0.4–1.8)
Globulin, Total: 3 g/dL (ref 2.2–3.9)
Total Protein ELP: 5.1 g/dL — ABNORMAL LOW (ref 6.0–8.5)

## 2019-08-04 LAB — BASIC METABOLIC PANEL
Anion gap: 8 (ref 5–15)
BUN: 43 mg/dL — ABNORMAL HIGH (ref 8–23)
CO2: 29 mmol/L (ref 22–32)
Calcium: 8.4 mg/dL — ABNORMAL LOW (ref 8.9–10.3)
Chloride: 100 mmol/L (ref 98–111)
Creatinine, Ser: 1.86 mg/dL — ABNORMAL HIGH (ref 0.44–1.00)
GFR calc Af Amer: 33 mL/min — ABNORMAL LOW (ref >60)
GFR calc non Af Amer: 28 mL/min — ABNORMAL LOW (ref >60)
Glucose, Bld: 167 mg/dL — ABNORMAL HIGH (ref 70–99)
Potassium: 3.9 mmol/L (ref 3.5–5.1)
Sodium: 137 mmol/L (ref 135–145)

## 2019-08-04 NOTE — Progress Notes (Signed)
   08/04/19 0130  Clinical Encounter Type  Visited With Patient  Visit Type Follow-up  Referral From Chaplain  Consult/Referral To Chaplain  Patient was eating lunch when chaplain arrived. Patient told chaplain that she lost 20 lbs. Chaplain is concerned about what will happen to and with patient once she is discharged. Housing is a problem for patient and when released she will only go back to the street. She said she doesn't want to go to the streets and doesn't want to deal with the people she has be dealing with. Patient acknowledged needing a program. Chaplain will reach out to social worker to see if there is anything that can be done to find a program for patient.

## 2019-08-04 NOTE — Progress Notes (Signed)
Mobility Specialist - Progress Note   08/04/19 1400  Mobility  Activity Refused mobility  Mobility performed by Mobility specialist    Pt was eating lunch in bed upon arrival. Will attempt session at a later date/time.   Ruffin Lada Mobility Specialist  08/04/19, 2:01 PM

## 2019-08-04 NOTE — Progress Notes (Signed)
[facility]  ROUNDING NOTE   Subjective:   Good UOP 07/26 0701 - 07/27 0700 In: 2276 [P.O.:2160; I.V.:66; IV Piggyback:50] Out: 1324 [Urine:3550] States she is able to eat without nausea or vomiting Still has large amount of lower extremity edema Continued on IV Lasix infusion Worried about discharge after the hospital states she is homeless  Objective:  Vital signs in last 24 hours:  Temp:  [98.2 F (36.8 C)-99 F (37.2 C)] 99 F (37.2 C) (07/27 1605) Pulse Rate:  [86-93] 89 (07/27 1605) Resp:  [18-20] 20 (07/27 0419) BP: (107-127)/(68-99) 112/68 (07/27 1605) SpO2:  [96 %-100 %] 98 % (07/27 1605) Weight:  [110.9 kg] 110.9 kg (07/27 0419)  Weight change: -1.134 kg Filed Weights   08/02/19 0345 08/03/19 0534 08/04/19 0419  Weight: (!) 110.5 kg (!) 112 kg (!) 110.9 kg    Intake/Output: I/O last 3 completed shifts: In: 2931.4 [P.O.:2760; I.V.:121.4; IV Piggyback:50] Out: 4010 [Urine:5350]   Intake/Output this shift:  Total I/O In: 480 [P.O.:480] Out: 2000 [Urine:2000]  Physical Exam: General: NAD   Head: Normocephalic, atraumatic. Moist oral mucosal membranes  Eyes: Anicteric  Lungs:  Bilateral rales, normal effort  Heart: Regular rate and rhythm  Abdomen:  Soft, nontender, BS present  Extremities: 2+ peripheral edema.  Neurologic: Nonfocal, moving all four extremities  Skin: No lesions        Basic Metabolic Panel: Recent Labs  Lab 07/31/19 0605 07/31/19 0605 08/01/19 0533 08/01/19 0533 08/02/19 0932 08/03/19 0523 08/04/19 0519  NA 137  --  137  --  137 139 137  K 4.0  --  3.6  --  3.7 3.5 3.9  CL 106  --  102  --  100 101 100  CO2 27  --  25  --  28 31 29   GLUCOSE 116*  --  131*  --  106* 125* 167*  BUN 54*  --  56*  --  50* 49* 43*  CREATININE 2.90*  --  2.66*  --  2.16* 1.86* 1.86*  CALCIUM 8.4*   < > 8.2*   < > 8.3* 8.0* 8.4*  MG  --   --   --   --  1.7  --   --   PHOS  --   --   --   --  2.7  --   --    < > = values in this  interval not displayed.    Liver Function Tests: No results for input(s): AST, ALT, ALKPHOS, BILITOT, PROT, ALBUMIN in the last 168 hours. No results for input(s): LIPASE, AMYLASE in the last 168 hours. No results for input(s): AMMONIA in the last 168 hours.  CBC: Recent Labs  Lab 07/29/19 0736 08/01/19 0533 08/02/19 0932 08/03/19 0523 08/04/19 0519  WBC 8.6 8.5 6.4 7.7 8.1  NEUTROABS  --   --  4.3 5.2 5.5  HGB 9.7* 9.1* 8.9* 9.2* 8.4*  HCT 33.1* 31.0* 30.2* 31.6* 27.0*  MCV 71.3* 71.3* 71.2* 71.7* 68.4*  PLT 290 302 313 320 314    Cardiac Enzymes: No results for input(s): CKTOTAL, CKMB, CKMBINDEX, TROPONINI in the last 168 hours.  BNP: Invalid input(s): POCBNP  CBG: No results for input(s): GLUCAP in the last 168 hours.  Microbiology: Results for orders placed or performed during the hospital encounter of 06/19/19  SARS Coronavirus 2 by RT PCR (hospital order, performed in [facility] hospital lab) Nasopharyngeal Nasopharyngeal Swab     Status: None   Collection Time:  06/19/19  9:28 AM   Specimen: Nasopharyngeal Swab  Result Value Ref Range Status   SARS Coronavirus 2 NEGATIVE NEGATIVE Final    Comment: (NOTE) SARS-CoV-2 target nucleic acids are NOT DETECTED.  The SARS-CoV-2 RNA is generally detectable in upper and lower respiratory specimens during the acute phase of infection. The lowest concentration of SARS-CoV-2 viral copies this assay can detect is 250 copies / mL. A negative result does not preclude SARS-CoV-2 infection and should not be used as the sole basis for treatment or other patient management decisions.  A negative result may occur with improper specimen collection / handling, submission of specimen other than nasopharyngeal swab, presence of viral mutation(s) within the areas targeted by this assay, and inadequate number of viral copies (<250 copies / mL). A negative result must be combined with clinical observations, patient history, and  epidemiological information.  Fact Sheet for Patients:   StrictlyIdeas.no  Fact Sheet for Healthcare Providers: BankingDealers.co.za  This test is not yet approved or  cleared by the [place] FDA and has been authorized for detection and/or diagnosis of SARS-CoV-2 by FDA under an Emergency Use Authorization (EUA).  This EUA will remain in effect (meaning this test can be used) for the duration of the COVID-19 declaration under Section 564(b)(1) of the Act, 21 U.S.C. section 360bbb-3(b)(1), unless the authorization is terminated or revoked sooner.  Performed at [facility], [address] [place], [place] [address]     Coagulation Studies: No results for input(s): LABPROT, INR in the last 72 hours.  Urinalysis: No results for input(s): COLORURINE, LABSPEC, PHURINE, GLUCOSEU, HGBUR, BILIRUBINUR, KETONESUR, PROTEINUR, UROBILINOGEN, NITRITE, LEUKOCYTESUR in the last 72 hours.  Invalid input(s): APPERANCEUR    Imaging: No results found.   Medications:   . sodium chloride    . albumin human 12.5 g (08/04/19 0939)  . furosemide (LASIX) infusion 6 mg/hr (08/03/19 1317)   . aspirin EC  81 mg Oral Daily  . atorvastatin  20 mg Oral Daily  . carvedilol  3.125 mg Oral BID WC  . FLUoxetine  20 mg Oral Daily  . gabapentin  300 mg Oral TID  . rivaroxaban  20 mg Oral Q supper  . sodium chloride flush  3 mL Intravenous Once  . sodium chloride flush  3 mL Intravenous Q12H   sodium chloride, acetaminophen, ALPRAZolam, sodium chloride flush, traMADol  Assessment/ Plan:  [name] is a 64 y.o. black female with hypertension, hyperlipidemia, depression , polysubstance abuse, recent COVID-19 infection who was admitted to [facility] on 07/26/2019 for Acute CHF (congestive heart failure) ([place]) [I50.9] Acute on chronic congestive heart failure, unspecified heart failure type ([place]) [I50.9]  1. Acute renal failure on chronic  kidney disease stage IIIB with proteinuria: baseline creatinine 1.7, GFR of 32 on 03/08/19. Suspect patient may have progression of chronic kidney disease from multiple episodes of acute kidney injury with limited recovery.  Chronic kidney disease has been thought to be secondary to hypertension in the past.  Acute renal failure secondary to acute cardiorenal syndrome.  No IV contrast exposure. Renal ultrasound without obstruction.  Shows diffuse increased echogenicity of the bilateral kidneys.  - patient continues to have good urine output    2. Acute exacerbation of chronic systolic and diastolic congestive heart failure and generalized edema: echocardiogram on 06/22/19.  EF of 25 to 82%, grade 3 diastolic dysfunction.  Moderately elevated pulmonary artery systolic pressure, right ventricle severely enlarged.  Estimated RVSP 60 mm suggesting pulmonary hypertension Home regimen  of bumetanide, carvedilol, Entresto, and spironolactone. On hold at present -Continue IV Lasix infusion.  Continue IV albumin supplementation - avoid hypotension 07/26 0701 - 07/27 0700 In: 2276 [P.O.:2160; I.V.:66; IV Piggyback:50] Out: 9090 [Urine:3550]    LOS: 8 Katelyn Lane 7/27/20214:36 PM

## 2019-08-04 NOTE — Progress Notes (Signed)
PROGRESS NOTE    Katelyn Lane  OJJ:[id number] DOB: 01/15/1955 DOA: 07/26/2019 PCP: [name], MD    Brief Narrative:  64 year old female with known history of chronic systolic heart failure, recent COVID-19 infection, hyperlipidemia and hypertension, stage IIIb chronic kidney disease presented to the emergency room with acute onset of worsening dyspnea associated with dry cough wheezing, orthopnea and PND as well lower extremity edema for 1 week.  Patient had previous admissions with noncompliance.  She smokes cocaine and she is unfortunately homeless. At the emergency room vitals were stable.  BUN 52/creatinine 2.39.  BNP 4500.  Chest x-ray showed cardiomegaly and interstitial pulmonary edema.  Patient was given IV Lasix and admission requested for CHF exacerbation.   Assessment & Plan:   Active Problems:   Acute CHF (congestive heart failure) (HCC)  Acute on chronic combined congestive heart failure:  Recent echocardiogram with ejection fraction 25%. Patient was admitted and started on IV Lasix, inadequate diuresis with advanced kidney disease. Started on IV Lasix infusion, currently remains on 6 mg/h.  Excellent diuresis, -11 L balance. Continue carvedilol and Aldactone.  Hold Entresto secondary to AKI.  Appreciate cardiology follow-up. Renal ultrasound stable. does not show any hydronephrosis or retention.  Shows medical renal disease.  AKI on CKD stage IIIb: Likely secondary to above.  Treated with IV diuresis with improvement.  Creatinine less than 2.  Excellent urine output.    Anemia of chronic disease: Hemoglobin low but is stable.  Hypertension: On carvedilol.  Entresto on hold.  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  Social: Homeless.  Severe medical issues.  Social worker on board helping with placement.   DVT prophylaxis:  rivaroxaban (XARELTO) tablet 20 mg   Code Status: Full code Family Communication: None Disposition Plan: Status is:  Inpatient  Remains inpatient appropriate because:IV treatments appropriate due to intensity of illness or inability to take PO   Dispo: The patient is from: Home              Anticipated d/c is to: Home/shelter              Anticipated d/c date is: 2 days.              Patient currently is not medically stable to d/c.    Discussed with nephrology.     Consultants:   Cardiology  Nephrology  Procedures:   None   Antimicrobials:   None     Subjective: Seen and examined.  Breathing is fine.  Legs hurt.  Objective: Vitals:   08/03/19 1953 08/04/19 0419 08/04/19 0741 08/04/19 1126  BP: (!) 120/95 115/82 116/81 (!) 107/87  Pulse: 92 93 91 86  Resp: 20 20    Temp: 98.2 F (36.8 C) 99 F (37.2 C) 98.7 F (37.1 C) 98.4 F (36.9 C)  TempSrc:  Oral Oral Oral  SpO2: 100% 96% 96% 98%  Weight:  (!) 110.9 kg    Height:        Intake/Output Summary (Last 24 hours) at 08/04/2019 1551 Last data filed at 08/04/2019 1354 Gross per 24 hour  Intake 596 ml  Output 3700 ml  Net -3104 ml   Filed Weights   08/02/19 0345 08/03/19 0534 08/04/19 0419  Weight: (!) 110.5 kg (!) 112 kg (!) 110.9 kg    Examination:  Physical Exam Constitutional:      Appearance: Normal appearance.  HENT:     Head: Atraumatic.  Eyes:     Pupils: Pupils are equal, round,  and reactive to light.  Neck:     Comments: Unable to appreciate JVD. Cardiovascular:     Rate and Rhythm: Regular rhythm.  Pulmonary:     Breath sounds: Normal breath sounds.     Comments: B/l basal crackles and poor air entry  Musculoskeletal:        General: Swelling present.     Right lower leg: Edema present.     Left lower leg: Edema present.  Neurological:     Mental Status: She is oriented to person, place, and time.   She has tenderness extremities.  1+ bilateral pedal edema, tender extremities.    Data Reviewed: I have personally reviewed following labs and imaging studies  CBC: Recent Labs  Lab  07/29/19 0736 08/01/19 0533 08/02/19 0932 08/03/19 0523 08/04/19 0519  WBC 8.6 8.5 6.4 7.7 8.1  NEUTROABS  --   --  4.3 5.2 5.5  HGB 9.7* 9.1* 8.9* 9.2* 8.4*  HCT 33.1* 31.0* 30.2* 31.6* 27.0*  MCV 71.3* 71.3* 71.2* 71.7* 68.4*  PLT 290 302 313 320 540   Basic Metabolic Panel: Recent Labs  Lab 07/31/19 0605 08/01/19 0533 08/02/19 0932 08/03/19 0523 08/04/19 0519  NA 137 137 137 139 137  K 4.0 3.6 3.7 3.5 3.9  CL 106 102 100 101 100  CO2 27 25 28 31 29   GLUCOSE 116* 131* 106* 125* 167*  BUN 54* 56* 50* 49* 43*  CREATININE 2.90* 2.66* 2.16* 1.86* 1.86*  CALCIUM 8.4* 8.2* 8.3* 8.0* 8.4*  MG  --   --  1.7  --   --   PHOS  --   --  2.7  --   --    GFR: Estimated Creatinine Clearance: 39.7 mL/min (A) (by C-G formula based on SCr of 1.86 mg/dL (H)). Liver Function Tests: No results for input(s): AST, ALT, ALKPHOS, BILITOT, PROT, ALBUMIN in the last 168 hours. No results for input(s): LIPASE, AMYLASE in the last 168 hours. No results for input(s): AMMONIA in the last 168 hours. Coagulation Profile: No results for input(s): INR, PROTIME in the last 168 hours. Cardiac Enzymes: No results for input(s): CKTOTAL, CKMB, CKMBINDEX, TROPONINI in the last 168 hours. BNP (last 3 results) No results for input(s): PROBNP in the last 8760 hours. HbA1C: No results for input(s): HGBA1C in the last 72 hours. CBG: No results for input(s): GLUCAP in the last 168 hours. Lipid Profile: No results for input(s): CHOL, HDL, LDLCALC, TRIG, CHOLHDL, LDLDIRECT in the last 72 hours. Thyroid Function Tests: No results for input(s): TSH, T4TOTAL, FREET4, T3FREE, THYROIDAB in the last 72 hours. Anemia Panel: No results for input(s): VITAMINB12, FOLATE, FERRITIN, TIBC, IRON, RETICCTPCT in the last 72 hours. Sepsis Labs: No results for input(s): PROCALCITON, LATICACIDVEN in the last 168 hours.  No results found for this or any previous visit (from the past 240 hour(s)).       Radiology  Studies: No results found.      Scheduled Meds: . aspirin EC  81 mg Oral Daily  . atorvastatin  20 mg Oral Daily  . carvedilol  3.125 mg Oral BID WC  . FLUoxetine  20 mg Oral Daily  . gabapentin  300 mg Oral TID  . rivaroxaban  20 mg Oral Q supper  . sodium chloride flush  3 mL Intravenous Once  . sodium chloride flush  3 mL Intravenous Q12H   Continuous Infusions: . sodium chloride    . albumin human 12.5 g (08/04/19 0939)  . furosemide (LASIX)  infusion 6 mg/hr (08/03/19 1317)     LOS: 8 days    Time spent: 30 minutes    Barb Merino, MD [facility] Pager [phone]

## 2019-08-04 NOTE — Progress Notes (Signed)
Adult protective services called to request a Psych eval for the patient. Patient is unreachable due to homeless status and no cell phone. APS is working to place patient in rehab at time of discharge. I discussed with Katelyn Lane [phone] that patient was in a good mood without any behavior issues with me so far today. Patient is frequently admitted here.

## 2019-08-05 DIAGNOSIS — I5041 Acute combined systolic (congestive) and diastolic (congestive) heart failure: Secondary | ICD-10-CM | POA: Diagnosis not present

## 2019-08-05 LAB — BASIC METABOLIC PANEL
Anion gap: 8 (ref 5–15)
BUN: 44 mg/dL — ABNORMAL HIGH (ref 8–23)
CO2: 30 mmol/L (ref 22–32)
Calcium: 8.2 mg/dL — ABNORMAL LOW (ref 8.9–10.3)
Chloride: 96 mmol/L — ABNORMAL LOW (ref 98–111)
Creatinine, Ser: 1.86 mg/dL — ABNORMAL HIGH (ref 0.44–1.00)
GFR calc Af Amer: 33 mL/min — ABNORMAL LOW (ref >60)
GFR calc non Af Amer: 28 mL/min — ABNORMAL LOW (ref >60)
Glucose, Bld: 157 mg/dL — ABNORMAL HIGH (ref 70–99)
Potassium: 3.3 mmol/L — ABNORMAL LOW (ref 3.5–5.1)
Sodium: 134 mmol/L — ABNORMAL LOW (ref 135–145)

## 2019-08-05 LAB — MAGNESIUM: Magnesium: 1.6 mg/dL — ABNORMAL LOW (ref 1.7–2.4)

## 2019-08-05 LAB — CBC WITH DIFFERENTIAL/PLATELET
Abs Immature Granulocytes: 0.06 10*3/uL (ref 0.00–0.07)
Basophils Absolute: 0.1 10*3/uL (ref 0.0–0.1)
Basophils Relative: 1 %
Eosinophils Absolute: 0.1 10*3/uL (ref 0.0–0.5)
Eosinophils Relative: 2 %
HCT: 26.6 % — ABNORMAL LOW (ref 36.0–46.0)
Hemoglobin: 7.8 g/dL — ABNORMAL LOW (ref 12.0–15.0)
Immature Granulocytes: 1 %
Lymphocytes Relative: 14 %
Lymphs Abs: 1.4 10*3/uL (ref 0.7–4.0)
MCH: 20.7 pg — ABNORMAL LOW (ref 26.0–34.0)
MCHC: 29.3 g/dL — ABNORMAL LOW (ref 30.0–36.0)
MCV: 70.6 fL — ABNORMAL LOW (ref 80.0–100.0)
Monocytes Absolute: 1.4 10*3/uL — ABNORMAL HIGH (ref 0.1–1.0)
Monocytes Relative: 15 %
Neutro Abs: 6.6 10*3/uL (ref 1.7–7.7)
Neutrophils Relative %: 67 %
Platelets: 339 10*3/uL (ref 150–400)
RBC: 3.77 MIL/uL — ABNORMAL LOW (ref 3.87–5.11)
RDW: 20.2 % — ABNORMAL HIGH (ref 11.5–15.5)
WBC: 9.6 10*3/uL (ref 4.0–10.5)
nRBC: 0 % (ref 0.0–0.2)

## 2019-08-05 MED ORDER — MAGNESIUM SULFATE 2 GM/50ML IV SOLN
2.0000 g | Freq: Once | INTRAVENOUS | Status: AC
  Administered 2019-08-05: 2 g via INTRAVENOUS
  Filled 2019-08-05: qty 50

## 2019-08-05 MED ORDER — POTASSIUM CHLORIDE CRYS ER 20 MEQ PO TBCR
40.0000 meq | EXTENDED_RELEASE_TABLET | Freq: Once | ORAL | Status: AC
  Administered 2019-08-05: 40 meq via ORAL
  Filled 2019-08-05: qty 2

## 2019-08-05 NOTE — Hospital Course (Signed)
64 year old female with known history of chronic systolic heart failure, recent COVID-19 infection, hyperlipidemia and hypertension, stage IIIb chronic kidney disease presented to the emergency room with acute onset of worsening dyspnea associated with dry cough wheezing, orthopnea and PND as well lower extremity edema for 1 week.  Patient had previous admissions with noncompliance.  She smokes cocaine and she is unfortunately homeless. At the emergency room vitals were stable.  BUN 52/creatinine 2.39.  BNP 4500.  Chest x-ray showed cardiomegaly and interstitial pulmonary edema.  Patient was given IV Lasix and admission requested for CHF exacerbation.

## 2019-08-05 NOTE — Progress Notes (Signed)
[facility]  ROUNDING NOTE   Subjective:   Good UOP 07/27 0701 - 07/28 0700 In: 480 [P.O.:480] Out: 2800 [Urine:2800] States she is able to eat without nausea or vomiting Still has large amount of lower extremity edema Continued on IV Lasix infusion Worried about discharge after the hospital states she is homeless   Objective:  Vital signs in last 24 hours:  Temp:  [97.7 F (36.5 C)-98.2 F (36.8 C)] 97.7 F (36.5 C) (07/28 1542) Pulse Rate:  [54-89] 54 (07/28 1542) Resp:  [18-20] 18 (07/28 1542) BP: (101-110)/(71-80) 101/75 (07/28 1542) SpO2:  [96 %-100 %] 100 % (07/28 1542) Weight:  [510 kg] 108 kg (07/28 0514)  Weight change: -2.948 kg Filed Weights   08/03/19 0534 08/04/19 0419 08/05/19 0514  Weight: (!) 112 kg (!) 110.9 kg (!) 108 kg    Intake/Output: I/O last 3 completed shifts: In: 34 [P.O.:720; I.V.:66; IV Piggyback:50] Out: 2585 [Urine:4350]   Intake/Output this shift:  Total I/O In: 720 [P.O.:720] Out: 700 [Urine:700]  Physical Exam: General: NAD   Head: Normocephalic, atraumatic. Moist oral mucosal membranes  Eyes: Anicteric  Lungs:  Bilateral rales, normal effort  Heart: Regular rate and rhythm  Abdomen:  Soft, nontender, BS present  Extremities: 2+ peripheral edema.  Neurologic: Nonfocal, moving all four extremities  Skin: No lesions        Basic Metabolic Panel: Recent Labs  Lab 08/01/19 0533 08/01/19 0533 08/02/19 0932 08/02/19 0932 08/03/19 0523 08/04/19 0519 08/05/19 0412  NA 137  --  137  --  139 137 134*  K 3.6  --  3.7  --  3.5 3.9 3.3*  CL 102  --  100  --  101 100 96*  CO2 25  --  28  --  31 29 30   GLUCOSE 131*  --  106*  --  125* 167* 157*  BUN 56*  --  50*  --  49* 43* 44*  CREATININE 2.66*  --  2.16*  --  1.86* 1.86* 1.86*  CALCIUM 8.2*   < > 8.3*   < > 8.0* 8.4* 8.2*  MG  --   --  1.7  --   --   --  1.6*  PHOS  --   --  2.7  --   --   --   --    < > = values in this interval not displayed.    Liver  Function Tests: No results for input(s): AST, ALT, ALKPHOS, BILITOT, PROT, ALBUMIN in the last 168 hours. No results for input(s): LIPASE, AMYLASE in the last 168 hours. No results for input(s): AMMONIA in the last 168 hours.  CBC: Recent Labs  Lab 08/01/19 0533 08/02/19 0932 08/03/19 0523 08/04/19 0519 08/05/19 0412  WBC 8.5 6.4 7.7 8.1 9.6  NEUTROABS  --  4.3 5.2 5.5 6.6  HGB 9.1* 8.9* 9.2* 8.4* 7.8*  HCT 31.0* 30.2* 31.6* 27.0* 26.6*  MCV 71.3* 71.2* 71.7* 68.4* 70.6*  PLT 302 313 320 314 339    Cardiac Enzymes: No results for input(s): CKTOTAL, CKMB, CKMBINDEX, TROPONINI in the last 168 hours.  BNP: Invalid input(s): POCBNP  CBG: No results for input(s): GLUCAP in the last 168 hours.  Microbiology: Results for orders placed or performed during the hospital encounter of 06/19/19  SARS Coronavirus 2 by RT PCR (hospital order, performed in [facility] hospital lab) Nasopharyngeal Nasopharyngeal Swab     Status: None   Collection Time: 06/19/19  9:28 AM  Specimen: Nasopharyngeal Swab  Result Value Ref Range Status   SARS Coronavirus 2 NEGATIVE NEGATIVE Final    Comment: (NOTE) SARS-CoV-2 target nucleic acids are NOT DETECTED.  The SARS-CoV-2 RNA is generally detectable in upper and lower respiratory specimens during the acute phase of infection. The lowest concentration of SARS-CoV-2 viral copies this assay can detect is 250 copies / mL. A negative result does not preclude SARS-CoV-2 infection and should not be used as the sole basis for treatment or other patient management decisions.  A negative result may occur with improper specimen collection / handling, submission of specimen other than nasopharyngeal swab, presence of viral mutation(s) within the areas targeted by this assay, and inadequate number of viral copies (<250 copies / mL). A negative result must be combined with clinical observations, patient history, and epidemiological information.  Fact Sheet  for Patients:   StrictlyIdeas.no  Fact Sheet for Healthcare Providers: BankingDealers.co.za  This test is not yet approved or  cleared by the [place] FDA and has been authorized for detection and/or diagnosis of SARS-CoV-2 by FDA under an Emergency Use Authorization (EUA).  This EUA will remain in effect (meaning this test can be used) for the duration of the COVID-19 declaration under Section 564(b)(1) of the Act, 21 U.S.C. section 360bbb-3(b)(1), unless the authorization is terminated or revoked sooner.  Performed at [facility], [address] [place], [place] [address]     Coagulation Studies: No results for input(s): LABPROT, INR in the last 72 hours.  Urinalysis: No results for input(s): COLORURINE, LABSPEC, PHURINE, GLUCOSEU, HGBUR, BILIRUBINUR, KETONESUR, PROTEINUR, UROBILINOGEN, NITRITE, LEUKOCYTESUR in the last 72 hours.  Invalid input(s): APPERANCEUR    Imaging: No results found.   Medications:   . sodium chloride    . albumin human 12.5 g (08/05/19 0909)  . furosemide (LASIX) infusion 6 mg/hr (08/04/19 2355)  . magnesium sulfate bolus IVPB 2 g (08/05/19 1613)   . aspirin EC  81 mg Oral Daily  . atorvastatin  20 mg Oral Daily  . carvedilol  3.125 mg Oral BID WC  . FLUoxetine  20 mg Oral Daily  . gabapentin  300 mg Oral TID  . rivaroxaban  20 mg Oral Q supper  . sodium chloride flush  3 mL Intravenous Once  . sodium chloride flush  3 mL Intravenous Q12H   sodium chloride, acetaminophen, ALPRAZolam, sodium chloride flush, traMADol  Assessment/ Plan:  Ms. Zilda No is a 64 y.o. black female with hypertension, hyperlipidemia, depression , polysubstance abuse, recent COVID-19 infection who was admitted to [facility] on 07/26/2019 for Acute CHF (congestive heart failure) ([place]) [I50.9] Acute on chronic congestive heart failure, unspecified heart failure type ([place]) [I50.9]  1. Acute renal failure on  chronic kidney disease stage IIIB with proteinuria: baseline creatinine 1.7, GFR of 32 on 03/08/19. Suspect patient may have progression of chronic kidney disease from multiple episodes of acute kidney injury with limited recovery.  Chronic kidney disease has been thought to be secondary to hypertension in the past.  Acute renal failure secondary to acute cardiorenal syndrome.  No IV contrast exposure. Renal ultrasound without obstruction.  Shows diffuse increased echogenicity of the bilateral kidneys.  - patient continues to have good urine output    2. Acute exacerbation of chronic systolic and diastolic congestive heart failure and generalized edema: echocardiogram on 06/22/19.  EF of 25 to 60%, grade 3 diastolic dysfunction.  Moderately elevated pulmonary artery systolic pressure, right ventricle severely enlarged.  Estimated RVSP 60 mm suggesting  pulmonary hypertension Home regimen of bumetanide, carvedilol, Entresto, and spironolactone. On hold at present -Continue IV Lasix infusion.  Continue IV albumin supplementation - avoid hypotension  07/27 0701 - 07/28 0700 In: 480 [P.O.:480] Out: 2800 [Urine:2800]    LOS: 9 Krystle Oberman 7/28/20214:43 PM

## 2019-08-05 NOTE — Progress Notes (Signed)
Cardiac Monitoring Event  Dysrhythmia: Notified of Nonsustained HR 137 per CCMD at this time.  Alarm reviewed and HR increase noted  At 2049  Symptoms:  Pt resting in bed.  SR 97 noted on telemetry. Level of Consciousness: alert and oriented.   Last set of vital signs taken:  Temp: 98 F (36.7 C)  Pulse Rate: 94  Resp: 20  BP: 100/74  SpO2: 100 %  Name of MD Notified:  na  Time MD Notified:na  Comments/Actions Taken:  Continue to monitor.

## 2019-08-05 NOTE — Progress Notes (Signed)
PROGRESS NOTE    Katelyn Lane   PRF:[id number]  DOB: 04/05/1955  PCP: [name], MD    DOA: 07/26/2019 LOS: 7   Brief Narrative   64 year old female with known history of chronic systolic heart failure, recent COVID-19 infection, hyperlipidemia and hypertension, stage IIIb chronic kidney disease presented to the emergency room with acute onset of worsening dyspnea associated with dry cough wheezing, orthopnea and PND as well lower extremity edema for 1 week.  Patient had previous admissions with noncompliance.  She smokes cocaine and she is unfortunately homeless. At the emergency room vitals were stable.  BUN 52/creatinine 2.39.  BNP 4500.  Chest x-ray showed cardiomegaly and interstitial pulmonary edema.  Patient was given IV Lasix and admission requested for CHF exacerbation.     Assessment & Plan   Active Problems:   Acute CHF (congestive heart failure) (HCC)   Acute on chronic combined congestive heart failure -   Echocardiogram on 06/22/2019 EF 25%. Cardiology and nephrology are following.  On Lasix drip. Net fluid balance is -10.1 L to date. Continue Coreg and spironolactone.  Hold Entresto. Continue carvedilol and Aldactone.  Hold Entresto. Diuresis changes per cardiology and nephrology.  AKI on CKD stage IIIb -AKI present on admission, likely secondary to CHF and cardiorenal syndrome.  Improving with IV diuresis.  Nephrology is following.  Renal ultrasound was negative for obstruction.   Anemia of chronic disease - stable, no evidence of bleeding.  Monitor CBC.  Hypertension -continue Coreg.    Hold Entresto given AKI.  History of ventricular thrombus -continue Xarelto.  Social - Homeless.  Severe medical issues.  TOC following to assist with placement, if possible.  Last admission, was discharged to rehab, but signed herself out.  SNF placement unlikely given ongoing drug abuse.  Patient would have to agree to substance abuse rehab.  Obesity:  Body mass index is 37.28 kg/m.  Complicates overall care and prognosis.  DVT prophylaxis:  rivaroxaban (XARELTO) tablet 20 mg   Diet:  Diet Orders (From admission, onward)    Start     Ordered   07/27/19 0209  Diet 2 gram sodium Room service appropriate? Yes; Fluid consistency: Thin  Diet effective now       Question Answer Comment  Room service appropriate? Yes   Fluid consistency: Thin      07/27/19 0228            Code Status: Full Code    Subjective 08/05/19    Patient seen this morning at bedside.  No acute events reported.  She states she would like to get up to walk around.  Reports constipation but cannot recall when her last BM was.  Denies other acute complaints including chest pain, shortness of breath, fevers chills, nausea vomiting or dysuria.   Disposition Plan & Communication   Status is: Inpatient  Remains inpatient appropriate because:IV treatments appropriate due to intensity of illness or inability to take PO   Dispo:  Patient From: Home  Planned Disposition: Home with Health Care Svc  Expected discharge date: 08/07/19  Medically stable for discharge: No         Family Communication: None at bedside, will attempt to call   Consults, Procedures, Significant Events   Consultants:   Nephrology  Cardiology  Procedures:   None  Antimicrobials:   None   Objective   Vitals:   08/04/19 2011 08/05/19 0514 08/05/19 0740 08/05/19 1542  BP: 104/71 110/80 104/74 101/75  Pulse: 65 55 89  54  Resp: 20 20 18 18   Temp: 98 F (36.7 C) 98.2 F (36.8 C) 98 F (36.7 C) 97.7 F (36.5 C)  TempSrc:  Oral Oral   SpO2: 100% 96% 98% 100%  Weight:  (!) 108 kg    Height:        Intake/Output Summary (Last 24 hours) at 08/05/2019 1952 Last data filed at 08/05/2019 1853 Gross per 24 hour  Intake 995.77 ml  Output 1500 ml  Net -504.23 ml   Filed Weights   08/03/19 0534 08/04/19 0419 08/05/19 0514  Weight: (!) 112 kg (!) 110.9 kg (!) 108 kg      Physical Exam:  General exam: awake, alert, no acute distress Respiratory system: CTAB with diminished bases, normal respiratory effort. Cardiovascular system: normal S1/S2, RRR, lower extremity edema.   Central nervous system: A&O x3. no gross focal neurologic deficits, normal speech Extremities: moves all, no edema, normal tone Skin: dry, intact, normal temperature Psychiatry: normal mood, congruent affect, judgement and insight appear normal  Labs   Data Reviewed: I have personally reviewed following labs and imaging studies  CBC: Recent Labs  Lab 08/01/19 0533 08/02/19 0932 08/03/19 0523 08/04/19 0519 08/05/19 0412  WBC 8.5 6.4 7.7 8.1 9.6  NEUTROABS  --  4.3 5.2 5.5 6.6  HGB 9.1* 8.9* 9.2* 8.4* 7.8*  HCT 31.0* 30.2* 31.6* 27.0* 26.6*  MCV 71.3* 71.2* 71.7* 68.4* 70.6*  PLT 302 313 320 314 782   Basic Metabolic Panel: Recent Labs  Lab 08/01/19 0533 08/02/19 0932 08/03/19 0523 08/04/19 0519 08/05/19 0412  NA 137 137 139 137 134*  K 3.6 3.7 3.5 3.9 3.3*  CL 102 100 101 100 96*  CO2 25 28 31 29 30   GLUCOSE 131* 106* 125* 167* 157*  BUN 56* 50* 49* 43* 44*  CREATININE 2.66* 2.16* 1.86* 1.86* 1.86*  CALCIUM 8.2* 8.3* 8.0* 8.4* 8.2*  MG  --  1.7  --   --  1.6*  PHOS  --  2.7  --   --   --    GFR: Estimated Creatinine Clearance: 39.2 mL/min (A) (by C-G formula based on SCr of 1.86 mg/dL (H)). Liver Function Tests: No results for input(s): AST, ALT, ALKPHOS, BILITOT, PROT, ALBUMIN in the last 168 hours. No results for input(s): LIPASE, AMYLASE in the last 168 hours. No results for input(s): AMMONIA in the last 168 hours. Coagulation Profile: No results for input(s): INR, PROTIME in the last 168 hours. Cardiac Enzymes: No results for input(s): CKTOTAL, CKMB, CKMBINDEX, TROPONINI in the last 168 hours. BNP (last 3 results) No results for input(s): PROBNP in the last 8760 hours. HbA1C: No results for input(s): HGBA1C in the last 72 hours. CBG: No results  for input(s): GLUCAP in the last 168 hours. Lipid Profile: No results for input(s): CHOL, HDL, LDLCALC, TRIG, CHOLHDL, LDLDIRECT in the last 72 hours. Thyroid Function Tests: No results for input(s): TSH, T4TOTAL, FREET4, T3FREE, THYROIDAB in the last 72 hours. Anemia Panel: No results for input(s): VITAMINB12, FOLATE, FERRITIN, TIBC, IRON, RETICCTPCT in the last 72 hours. Sepsis Labs: No results for input(s): PROCALCITON, LATICACIDVEN in the last 168 hours.  No results found for this or any previous visit (from the past 240 hour(s)).    Imaging Studies   No results found.   Medications   Scheduled Meds: . aspirin EC  81 mg Oral Daily  . atorvastatin  20 mg Oral Daily  . carvedilol  3.125 mg Oral BID WC  . FLUoxetine  20 mg Oral Daily  . gabapentin  300 mg Oral TID  . rivaroxaban  20 mg Oral Q supper  . sodium chloride flush  3 mL Intravenous Once  . sodium chloride flush  3 mL Intravenous Q12H   Continuous Infusions: . sodium chloride    . albumin human 12.5 g (08/05/19 0909)  . furosemide (LASIX) infusion 6 mg/hr (08/04/19 2355)       LOS: 9 days    Time spent: 30 minutes    Ezekiel Slocumb, DO [facility]  08/05/2019, 7:52 PM    If 7PM-7AM, please contact night-coverage. How to contact the [facility] Attending or Consulting provider [place] or covering provider during after hours [place], for this patient?    1. Check the care team in [facility] and look for a) attending/consulting [facility] provider listed and b) the [facility] team listed 2. Log into [email or web address] and use [place]'s universal password to access. If you do not have the password, please contact the hospital operator. 3. Locate the [facility] provider you are looking for under [facility] and page to a number that you can be directly reached. 4. If you still have difficulty reaching the provider, please page the [facility] (Director on Call) for the Hospitalists listed on amion for assistance.

## 2019-08-05 NOTE — Progress Notes (Signed)
°   08/05/19 1200  Clinical Encounter Type  Visited With Patient  Visit Type Follow-up  Referral From Chaplain  Consult/Referral To Chaplain  Chaplain briefly visited with patient. Chaplain with speak with case manager to find out it there is a facility or program Patient can be sent to.

## 2019-08-06 DIAGNOSIS — I5041 Acute combined systolic (congestive) and diastolic (congestive) heart failure: Secondary | ICD-10-CM | POA: Diagnosis not present

## 2019-08-06 LAB — BASIC METABOLIC PANEL
Anion gap: 10 (ref 5–15)
BUN: 45 mg/dL — ABNORMAL HIGH (ref 8–23)
CO2: 30 mmol/L (ref 22–32)
Calcium: 8.4 mg/dL — ABNORMAL LOW (ref 8.9–10.3)
Chloride: 95 mmol/L — ABNORMAL LOW (ref 98–111)
Creatinine, Ser: 1.92 mg/dL — ABNORMAL HIGH (ref 0.44–1.00)
GFR calc Af Amer: 32 mL/min — ABNORMAL LOW (ref >60)
GFR calc non Af Amer: 27 mL/min — ABNORMAL LOW (ref >60)
Glucose, Bld: 152 mg/dL — ABNORMAL HIGH (ref 70–99)
Potassium: 4.1 mmol/L (ref 3.5–5.1)
Sodium: 135 mmol/L (ref 135–145)

## 2019-08-06 LAB — CBC
HCT: 27 % — ABNORMAL LOW (ref 36.0–46.0)
Hemoglobin: 8.5 g/dL — ABNORMAL LOW (ref 12.0–15.0)
MCH: 21.7 pg — ABNORMAL LOW (ref 26.0–34.0)
MCHC: 31.5 g/dL (ref 30.0–36.0)
MCV: 69.1 fL — ABNORMAL LOW (ref 80.0–100.0)
Platelets: 352 10*3/uL (ref 150–400)
RBC: 3.91 MIL/uL (ref 3.87–5.11)
RDW: 20.5 % — ABNORMAL HIGH (ref 11.5–15.5)
WBC: 10.3 10*3/uL (ref 4.0–10.5)
nRBC: 0 % (ref 0.0–0.2)

## 2019-08-06 LAB — MAGNESIUM: Magnesium: 2 mg/dL (ref 1.7–2.4)

## 2019-08-06 MED ORDER — ENSURE MAX PROTEIN PO LIQD
11.0000 [oz_av] | Freq: Two times a day (BID) | ORAL | Status: DC
  Administered 2019-08-06: 237 mL via ORAL
  Administered 2019-08-10 – 2019-08-11 (×2): 11 [oz_av] via ORAL
  Filled 2019-08-06: qty 330

## 2019-08-06 MED ORDER — TORSEMIDE 20 MG PO TABS
40.0000 mg | ORAL_TABLET | Freq: Every day | ORAL | Status: DC
  Administered 2019-08-07 – 2019-08-13 (×7): 40 mg via ORAL
  Filled 2019-08-06 (×7): qty 2

## 2019-08-06 MED ORDER — ADULT MULTIVITAMIN W/MINERALS CH
1.0000 | ORAL_TABLET | Freq: Every day | ORAL | Status: DC
  Administered 2019-08-07 – 2019-09-02 (×25): 1 via ORAL
  Filled 2019-08-06 (×26): qty 1

## 2019-08-06 NOTE — Progress Notes (Signed)
[facility]  ROUNDING NOTE   Subjective:   Good UOP 07/28 0701 - 07/29 0700 In: 1427.3 [P.O.:960; I.V.:312.3; IV Piggyback:155] Out: 1400 [Urine:1400] States she is able to eat without nausea or vomiting Still has large amount of lower extremity edema Continued on IV Lasix infusion Worried about discharge after the hospital states she is homeless   Objective:  Vital signs in last 24 hours:  Temp:  [98 F (36.7 C)-99.5 F (37.5 C)] 98.5 F (36.9 C) (07/29 1600) Pulse Rate:  [71-94] 80 (07/29 1600) Resp:  [17-20] 18 (07/29 1600) BP: (91-134)/(67-74) 123/70 (07/29 1600) SpO2:  [92 %-100 %] 100 % (07/29 1600) Weight:  [108.7 kg] 108.7 kg (07/29 0352)  Weight change: 0.726 kg Filed Weights   08/04/19 0419 08/05/19 0514 08/06/19 0352  Weight: (!) 110.9 kg (!) 108 kg (!) 108.7 kg    Intake/Output: I/O last 3 completed shifts: In: 1427.3 [P.O.:960; I.V.:312.3; IV Piggyback:155] Out: 2200 [Urine:2200]   Intake/Output this shift:  Total I/O In: 720 [P.O.:720] Out: -   Physical Exam: General: NAD   Head: Normocephalic, atraumatic. Moist oral mucosal membranes  Eyes: Anicteric  Lungs:  Bilateral rales, normal effort  Heart: Regular rate and rhythm  Abdomen:  Soft, nontender, BS present  Extremities: 2+ peripheral edema.  Neurologic: Nonfocal, moving all four extremities  Skin: No lesions        Basic Metabolic Panel: Recent Labs  Lab 08/02/19 0932 08/02/19 0932 08/03/19 0523 08/03/19 0523 08/04/19 0519 08/05/19 0412 08/06/19 0552  NA 137  --  139  --  137 134* 135  K 3.7  --  3.5  --  3.9 3.3* 4.1  CL 100  --  101  --  100 96* 95*  CO2 28  --  31  --  29 30 30   GLUCOSE 106*  --  125*  --  167* 157* 152*  BUN 50*  --  49*  --  43* 44* 45*  CREATININE 2.16*  --  1.86*  --  1.86* 1.86* 1.92*  CALCIUM 8.3*   < > 8.0*   < > 8.4* 8.2* 8.4*  MG 1.7  --   --   --   --  1.6* 2.0  PHOS 2.7  --   --   --   --   --   --    < > = values in this interval  not displayed.    Liver Function Tests: No results for input(s): AST, ALT, ALKPHOS, BILITOT, PROT, ALBUMIN in the last 168 hours. No results for input(s): LIPASE, AMYLASE in the last 168 hours. No results for input(s): AMMONIA in the last 168 hours.  CBC: Recent Labs  Lab 08/02/19 0932 08/03/19 0523 08/04/19 0519 08/05/19 0412 08/06/19 0552  WBC 6.4 7.7 8.1 9.6 10.3  NEUTROABS 4.3 5.2 5.5 6.6  --   HGB 8.9* 9.2* 8.4* 7.8* 8.5*  HCT 30.2* 31.6* 27.0* 26.6* 27.0*  MCV 71.2* 71.7* 68.4* 70.6* 69.1*  PLT 313 320 314 339 352    Cardiac Enzymes: No results for input(s): CKTOTAL, CKMB, CKMBINDEX, TROPONINI in the last 168 hours.  BNP: Invalid input(s): POCBNP  CBG: No results for input(s): GLUCAP in the last 168 hours.  Microbiology: Results for orders placed or performed during the hospital encounter of 06/19/19  SARS Coronavirus 2 by RT PCR (hospital order, performed in [facility] hospital lab) Nasopharyngeal Nasopharyngeal Swab     Status: None   Collection Time: 06/19/19  9:28 AM  Specimen: Nasopharyngeal Swab  Result Value Ref Range Status   SARS Coronavirus 2 NEGATIVE NEGATIVE Final    Comment: (NOTE) SARS-CoV-2 target nucleic acids are NOT DETECTED.  The SARS-CoV-2 RNA is generally detectable in upper and lower respiratory specimens during the acute phase of infection. The lowest concentration of SARS-CoV-2 viral copies this assay can detect is 250 copies / mL. A negative result does not preclude SARS-CoV-2 infection and should not be used as the sole basis for treatment or other patient management decisions.  A negative result may occur with improper specimen collection / handling, submission of specimen other than nasopharyngeal swab, presence of viral mutation(s) within the areas targeted by this assay, and inadequate number of viral copies (<250 copies / mL). A negative result must be combined with clinical observations, patient history, and epidemiological  information.  Fact Sheet for Patients:   StrictlyIdeas.no  Fact Sheet for Healthcare Providers: BankingDealers.co.za  This test is not yet approved or  cleared by the [place] FDA and has been authorized for detection and/or diagnosis of SARS-CoV-2 by FDA under an Emergency Use Authorization (EUA).  This EUA will remain in effect (meaning this test can be used) for the duration of the COVID-19 declaration under Section 564(b)(1) of the Act, 21 U.S.C. section 360bbb-3(b)(1), unless the authorization is terminated or revoked sooner.  Performed at [facility], [address] [place], [place] [address]     Coagulation Studies: No results for input(s): LABPROT, INR in the last 72 hours.  Urinalysis: No results for input(s): COLORURINE, LABSPEC, PHURINE, GLUCOSEU, HGBUR, BILIRUBINUR, KETONESUR, PROTEINUR, UROBILINOGEN, NITRITE, LEUKOCYTESUR in the last 72 hours.  Invalid input(s): APPERANCEUR    Imaging: No results found.   Medications:   . sodium chloride 4 mL/hr at 08/06/19 0556   . aspirin EC  81 mg Oral Daily  . atorvastatin  20 mg Oral Daily  . carvedilol  3.125 mg Oral BID WC  . FLUoxetine  20 mg Oral Daily  . gabapentin  300 mg Oral TID  . [START ON 08/07/2019] multivitamin with minerals  1 tablet Oral Daily  . Ensure Max Protein  11 oz Oral BID  . rivaroxaban  20 mg Oral Q supper  . sodium chloride flush  3 mL Intravenous Once  . sodium chloride flush  3 mL Intravenous Q12H  . [START ON 08/07/2019] torsemide  40 mg Oral Daily   sodium chloride, acetaminophen, ALPRAZolam, sodium chloride flush, traMADol  Assessment/ Plan:  [name] is a 64 y.o. black female with hypertension, hyperlipidemia, depression , polysubstance abuse, recent COVID-19 infection who was admitted to [facility] on 07/26/2019 for Acute CHF (congestive heart failure) ([place]) [I50.9] Acute on chronic congestive heart failure,  unspecified heart failure type ([place]) [I50.9]  1. Acute renal failure on chronic kidney disease stage IIIB with proteinuria: baseline creatinine 1.7, GFR of 32 on 03/08/19. Suspect patient may have progression of chronic kidney disease from multiple episodes of acute kidney injury with limited recovery.  Chronic kidney disease has been thought to be secondary to hypertension in the past.  Acute renal failure secondary to acute cardiorenal syndrome.  No IV contrast exposure. Renal ultrasound without obstruction.  Shows diffuse increased echogenicity of the bilateral kidneys.  - patient continues to have good urine output  07/28 0701 - 07/29 0700 In: 1427.3 [P.O.:960; I.V.:312.3; IV Piggyback:155] Out: 1400 [Urine:1400] Lab Results  Component Value Date   CREATININE 1.92 (H) 08/06/2019   CREATININE 1.86 (H) 08/05/2019   CREATININE 1.86 (  H) 08/04/2019      2. Acute exacerbation of chronic systolic and diastolic congestive heart failure and generalized edema: echocardiogram on 06/22/19.  EF of 25 to 01%, grade 3 diastolic dysfunction.  Moderately elevated pulmonary artery systolic pressure, right ventricle severely enlarged.  Estimated RVSP 60 mm suggesting pulmonary hypertension Change IV furosemide to oral torsemide - avoid hypotension  07/28 0701 - 07/29 0700 In: 1427.3 [P.O.:960; I.V.:312.3; IV Piggyback:155] Out: 1400 [Urine:1400]    LOS: 10 Katelyn Lane 7/29/20214:54 PM

## 2019-08-06 NOTE — Progress Notes (Signed)
Initial Nutrition Assessment  DOCUMENTATION CODES:   Obesity unspecified  INTERVENTION:   Ensure Max protein supplement BID, each supplement provides 150kcal and 30g of protein.  MVI daily   NUTRITION DIAGNOSIS:   Inadequate oral intake related to acute illness as evidenced by per patient/family report.  GOAL:   Patient will meet greater than or equal to 90% of their needs  MONITOR:   PO intake, Supplement acceptance, Labs, Weight trends, Skin, I & O's  REASON FOR ASSESSMENT:   LOS    ASSESSMENT:   64 year old female with known history of chronic systolic heart failure, polysubstance abuse, homelessness, recent COVID-19 infection, hyperlipidemia and hypertension, stage IIIb chronic kidney disease who is admitted with CHF   RD working remotely.  Spoke with pt via phone. Pt is difficult to understand and seems to be a poor historian. Pt reports that she is eating ok; pt is documented to be eating 50-85% of meals in hospital. Pt reports that she does not drink supplements at home but she is willing to try them in hospital. Pt is currently ~25lbs above her UBW; pt is noted to have BLE edema. RD will add supplements and MVI to help pt meet her estimated needs. Pt is not appropriate for education at this time.    Medications reviewed and include: aspirin, albumin, lasix  Labs reviewed: BUN 45(H), creat 1.92(H), Mg 2.0 wnl Hgb 8.5(L), Hct 27.0(L), MCV 69.1(L), MCH 21.7(L)  NUTRITION - FOCUSED PHYSICAL EXAM: Unable to perform at this time   Diet Order:   Diet Order            Diet 2 gram sodium Room service appropriate? Yes; Fluid consistency: Thin  Diet effective now                EDUCATION NEEDS:   Education needs have been addressed  Skin:  Skin Assessment: Reviewed RN Assessment (ecchymosis)  Last BM:  7/28- TYPE 5  Height:   Ht Readings from Last 1 Encounters:  07/30/19 5\' 7"  (1.702 m)    Weight:   Wt Readings from Last 1 Encounters:  08/06/19 (!)  108.7 kg    Ideal Body Weight:  61.36 kg  BMI:  Body mass index is 37.53 kg/m.  Estimated Nutritional Needs:   Kcal:  1900-2200kcal/day  Protein:  95-110g/day  Fluid:  1.8L/day or per MD  Koleen Distance MS, RD, LDN Please refer to [facility] for RD and/or RD on-call/weekend/after hours pager

## 2019-08-06 NOTE — Progress Notes (Addendum)
   08/06/19 1340  Clinical Encounter Type  Visited With Patient;Other (Comment)  Visit Type Follow-up  Referral From Chaplain  Consult/Referral To Chaplain  Because of chaplain's concern for patient she contacted the case manager to find out if there any facilities that would take her Case manager and chaplain met and discussed what was going on with patient. Chaplain wanted case manager to help find a place for patient, but she told chaplain that patient did not appear to be ready for help and if a place was found and she got high she would be kicked out. Chaplain told her that she understood after talking with patient several times that she wanted help. Together case Freight forwarder and chaplain went in to talk to patient. When asked what does she want, she said a place with one bedroom. Chaplain asked if she was willing to go into a program to help her get clean and she said yes. Case manager explained she would not receive her monthly check, but it would go directly to the program that takes her and patient reluctantly agreed. Before the conversation was over, patient asked for a cigarette and chaplain and case manager told her she could not get cigarettes. Case worker told her that a program would not give her cigarettes, nor would they give her money for them. Case worker told patient she would see her later. Chaplain told patient to take it easy and that she would see her later. Chaplain thanked case worker for meeting with her.

## 2019-08-06 NOTE — Progress Notes (Signed)
Mobility Specialist - Progress Note   08/06/19 1136  Mobility  Activity Refused mobility  Mobility performed by Mobility specialist    Pt was sleep upon arrival and refused mobility d/t tiredness. Will attempt at a later time if schedule permits.    Kathee Delton Mobility Specialist 08/06/19, 11:39 AM

## 2019-08-06 NOTE — Plan of Care (Signed)
  Problem: Elimination: Goal: Will not experience complications related to urinary retention Outcome: Progressing  Continues to diurese without difficulty.

## 2019-08-06 NOTE — Progress Notes (Signed)
PROGRESS NOTE    Katelyn Lane   HYQ:[id number]  DOB: 1955/11/19  PCP: [name], MD    DOA: 07/26/2019 LOS: 64   Brief Narrative   64 year old female with known history of chronic systolic heart failure, recent COVID-19 infection, hyperlipidemia and hypertension, stage IIIb chronic kidney disease presented to the emergency room with acute onset of worsening dyspnea associated with dry cough wheezing, orthopnea and PND as well lower extremity edema for 1 week.  Patient had previous admissions with noncompliance.  She smokes cocaine and she is unfortunately homeless. At the emergency room vitals were stable.  BUN 52/creatinine 2.39.  BNP 4500.  Chest x-ray showed cardiomegaly and interstitial pulmonary edema.  Patient was given IV Lasix and admission requested for CHF exacerbation.     Assessment & Plan   Active Problems:   Acute CHF (congestive heart failure) (HCC)   Acute on chronic combined congestive heart failure -   Echocardiogram on 06/22/2019 EF 95%, grade 3 diastolic dysfunction, moderate pulmonary hypertension, severely enlarged right ventricle.  Cardiology and nephrology are following.   Lasix infusion was stopped today.  Started on oral torsemide. Net fluid balance is - 9.3 L to date, 1400 cc output yesterday. Continue Coreg and spironolactone.  Hold Entresto. Diuresis changes per cardiology and nephrology.  AKI on CKD stage IIIb -AKI present on admission, likely secondary to CHF and cardiorenal syndrome.  Improving with IV diuresis.  Nephrology is following.  Renal ultrasound was negative for obstruction.   Anemia of chronic disease - stable, no evidence of bleeding.  Monitor CBC.  Hypertension -continue Coreg.    Hold Entresto given AKI.  History of ventricular thrombus -continue Xarelto.  Social - Homeless.  Severe medical issues.  TOC following to assist with placement, if possible.  Last admission, was discharged to rehab, but signed herself  out.  SNF placement unlikely given ongoing drug abuse.  Patient would have to agree to substance abuse rehab.  Obesity: Body mass index is 37.53 kg/m.  Complicates overall care and prognosis.  DVT prophylaxis:  rivaroxaban (XARELTO) tablet 20 mg   Diet:  Diet Orders (From admission, onward)    Start     Ordered   07/27/19 0209  Diet 2 gram sodium Room service appropriate? Yes; Fluid consistency: Thin  Diet effective now       Question Answer Comment  Room service appropriate? Yes   Fluid consistency: Thin      07/27/19 0228            Code Status: Full Code    Subjective 08/06/19    Patient seen this morning at bedside.  No acute events reported.  She says she feels terrible.  When asked about specific symptoms, she stated " little of this little of that".  Denies chest pain or shortness of breath, fevers or chills, nausea vomiting or diarrhea.  Says her leg swelling is better but still painful on standing.    Disposition Plan & Communication   Status is: Inpatient  Remains inpatient appropriate because:IV treatments appropriate due to intensity of illness or inability to take PO.  Patient continues to be volume overloaded at this time.   Dispo:  Patient From: Home  Planned Disposition: Home with Health Care Svc  Expected discharge date: 1 to 2 days  Medically stable for discharge: No     Family Communication: None at bedside, will attempt to call   Consults, Procedures, Significant Events   Consultants:   Nephrology  Cardiology  Procedures:   None  Antimicrobials:   None   Objective   Vitals:   08/05/19 2002 08/06/19 0352 08/06/19 0353 08/06/19 0805  BP: 100/74 91/69 103/73 (!) 134/73  Pulse: 94 87 88 71  Resp: 20 20  17   Temp: 98 F (36.7 C) 98.7 F (37.1 C)  98.3 F (36.8 C)  TempSrc: Oral Oral    SpO2: 100% 100%  98%  Weight:  (!) 108.7 kg    Height:        Intake/Output Summary (Last 24 hours) at 08/06/2019 0904 Last data filed at  08/06/2019 5427 Gross per 24 hour  Intake 1427.3 ml  Output 1400 ml  Net 27.3 ml   Filed Weights   08/04/19 0419 08/05/19 0514 08/06/19 0352  Weight: (!) 110.9 kg (!) 108 kg (!) 108.7 kg    Physical Exam:  General exam: awake, alert, no acute distress Respiratory system: CTAB with diminished bases, normal respiratory effort. Cardiovascular system: normal S1/S2, RRR, tense lower extremity edema including feet.   Central nervous system: A&O x3. no gross focal neurologic deficits Extremities: moves all, normal tone,  Skin: dry, intact, normal temperature Psychiatry: normal mood, congruent affect, judgement and insight appear normal  Labs   Data Reviewed: I have personally reviewed following labs and imaging studies  CBC: Recent Labs  Lab 08/02/19 0932 08/03/19 0523 08/04/19 0519 08/05/19 0412 08/06/19 0552  WBC 6.4 7.7 8.1 9.6 10.3  NEUTROABS 4.3 5.2 5.5 6.6  --   HGB 8.9* 9.2* 8.4* 7.8* 8.5*  HCT 30.2* 31.6* 27.0* 26.6* 27.0*  MCV 71.2* 71.7* 68.4* 70.6* 69.1*  PLT 313 320 314 339 062   Basic Metabolic Panel: Recent Labs  Lab 08/02/19 0932 08/03/19 0523 08/04/19 0519 08/05/19 0412 08/06/19 0552  NA 137 139 137 134* 135  K 3.7 3.5 3.9 3.3* 4.1  CL 100 101 100 96* 95*  CO2 28 31 29 30 30   GLUCOSE 106* 125* 167* 157* 152*  BUN 50* 49* 43* 44* 45*  CREATININE 2.16* 1.86* 1.86* 1.86* 1.92*  CALCIUM 8.3* 8.0* 8.4* 8.2* 8.4*  MG 1.7  --   --  1.6* 2.0  PHOS 2.7  --   --   --   --    GFR: Estimated Creatinine Clearance: 38.1 mL/min (A) (by C-G formula based on SCr of 1.92 mg/dL (H)). Liver Function Tests: No results for input(s): AST, ALT, ALKPHOS, BILITOT, PROT, ALBUMIN in the last 168 hours. No results for input(s): LIPASE, AMYLASE in the last 168 hours. No results for input(s): AMMONIA in the last 168 hours. Coagulation Profile: No results for input(s): INR, PROTIME in the last 168 hours. Cardiac Enzymes: No results for input(s): CKTOTAL, CKMB, CKMBINDEX,  TROPONINI in the last 168 hours. BNP (last 3 results) No results for input(s): PROBNP in the last 8760 hours. HbA1C: No results for input(s): HGBA1C in the last 72 hours. CBG: No results for input(s): GLUCAP in the last 168 hours. Lipid Profile: No results for input(s): CHOL, HDL, LDLCALC, TRIG, CHOLHDL, LDLDIRECT in the last 72 hours. Thyroid Function Tests: No results for input(s): TSH, T4TOTAL, FREET4, T3FREE, THYROIDAB in the last 72 hours. Anemia Panel: No results for input(s): VITAMINB12, FOLATE, FERRITIN, TIBC, IRON, RETICCTPCT in the last 72 hours. Sepsis Labs: No results for input(s): PROCALCITON, LATICACIDVEN in the last 168 hours.  No results found for this or any previous visit (from the past 240 hour(s)).    Imaging Studies   No results found.   Medications  Scheduled Meds: . aspirin EC  81 mg Oral Daily  . atorvastatin  20 mg Oral Daily  . carvedilol  3.125 mg Oral BID WC  . FLUoxetine  20 mg Oral Daily  . gabapentin  300 mg Oral TID  . rivaroxaban  20 mg Oral Q supper  . sodium chloride flush  3 mL Intravenous Once  . sodium chloride flush  3 mL Intravenous Q12H   Continuous Infusions: . sodium chloride 4 mL/hr at 08/06/19 0556  . albumin human 12.5 g (08/05/19 0909)  . furosemide (LASIX) infusion 6 mg/hr (08/06/19 0556)       LOS: 10 days    Time spent: 25 minutes    Ezekiel Slocumb, DO [facility]  08/06/2019, 9:04 AM    If 7PM-7AM, please contact night-coverage. How to contact the [facility] Attending or Consulting provider [place] or covering provider during after hours [place], for this patient?    1. Check the care team in [place] and look for a) attending/consulting TRH provider listed and b) the [facility] team listed 2. Log into [email or web address] and use [place]'s universal password to access. If you do not have the password, please contact the hospital operator. 3. Locate the [facility] provider you are looking for under [facility] and page  to a number that you can be directly reached. 4. If you still have difficulty reaching the provider, please page the [facility] (Director on Call) for the Hospitalists listed on amion for assistance.

## 2019-08-06 NOTE — Progress Notes (Addendum)
   08/06/19 1150  Clinical Encounter Type  Visited With Patient  Visit Type Follow-up  Referral From Chaplain  Consult/Referral To [place] shadowed [employer] on her visit with patient. Patient was glad to see [facility]. When chaplains arrived, patient was asking for juice. A NT asked what kind of juice she wanted and patient wanted all four flavors and was told she could only have one and patient said orange. NT brought patient a cup of orange juice. Several times it was hard to understand what patient was saying. Chaplain asked patient if she has called the shelters that she has phone numbers for and chaplain could not understand her response. Chaplain told patient of her availability and left.

## 2019-08-07 DIAGNOSIS — I5041 Acute combined systolic (congestive) and diastolic (congestive) heart failure: Secondary | ICD-10-CM | POA: Diagnosis not present

## 2019-08-07 LAB — BASIC METABOLIC PANEL
Anion gap: 10 (ref 5–15)
BUN: 50 mg/dL — ABNORMAL HIGH (ref 8–23)
CO2: 28 mmol/L (ref 22–32)
Calcium: 8.5 mg/dL — ABNORMAL LOW (ref 8.9–10.3)
Chloride: 96 mmol/L — ABNORMAL LOW (ref 98–111)
Creatinine, Ser: 1.97 mg/dL — ABNORMAL HIGH (ref 0.44–1.00)
GFR calc Af Amer: 31 mL/min — ABNORMAL LOW (ref >60)
GFR calc non Af Amer: 26 mL/min — ABNORMAL LOW (ref >60)
Glucose, Bld: 121 mg/dL — ABNORMAL HIGH (ref 70–99)
Potassium: 4.1 mmol/L (ref 3.5–5.1)
Sodium: 134 mmol/L — ABNORMAL LOW (ref 135–145)

## 2019-08-07 MED ORDER — METOLAZONE 5 MG PO TABS
5.0000 mg | ORAL_TABLET | ORAL | Status: DC
  Filled 2019-08-07 (×2): qty 1

## 2019-08-07 MED ORDER — METOLAZONE 5 MG PO TABS
5.0000 mg | ORAL_TABLET | ORAL | Status: DC
  Administered 2019-08-07: 5 mg via ORAL
  Filled 2019-08-07: qty 1

## 2019-08-07 MED ORDER — LORAZEPAM 2 MG/ML IJ SOLN
0.5000 mg | INTRAMUSCULAR | Status: DC | PRN
  Administered 2019-08-09 – 2019-08-15 (×2): 0.5 mg via INTRAVENOUS
  Filled 2019-08-07 (×3): qty 1

## 2019-08-07 MED ORDER — HYDROCODONE-ACETAMINOPHEN 5-325 MG PO TABS
1.0000 | ORAL_TABLET | Freq: Four times a day (QID) | ORAL | Status: DC | PRN
  Administered 2019-08-08 (×2): 1 via ORAL
  Administered 2019-08-08 – 2019-08-12 (×3): 2 via ORAL
  Administered 2019-08-14 – 2019-08-23 (×4): 1 via ORAL
  Administered 2019-08-25 – 2019-08-31 (×2): 2 via ORAL
  Administered 2019-09-01: 1 via ORAL
  Filled 2019-08-07: qty 1
  Filled 2019-08-07: qty 2
  Filled 2019-08-07 (×2): qty 1
  Filled 2019-08-07: qty 2
  Filled 2019-08-07 (×2): qty 1
  Filled 2019-08-07 (×5): qty 2
  Filled 2019-08-07: qty 1

## 2019-08-07 NOTE — Progress Notes (Addendum)
PROGRESS NOTE    Katelyn Lane   QZR:[id number]  DOB: 1955/07/17  PCP: [name], MD    DOA: 07/26/2019 LOS: 65   Brief Narrative   64 year old female with known history of chronic systolic heart failure, recent COVID-19 infection, hyperlipidemia and hypertension, stage IIIb chronic kidney disease presented to the emergency room with acute onset of worsening dyspnea associated with dry cough wheezing, orthopnea and PND as well lower extremity edema for 1 week.  Patient had previous admissions with noncompliance.  She smokes cocaine and she is unfortunately homeless. At the emergency room vitals were stable.  BUN 52/creatinine 2.39.  BNP 4500.  Chest x-ray showed cardiomegaly and interstitial pulmonary edema.  Patient was given IV Lasix and admission requested for CHF exacerbation.     Assessment & Plan   Active Problems:   Hyperlipidemia   Depression   CKD (chronic kidney disease), stage III   Acute metabolic encephalopathy   History of substance abuse (HCC)   Chronic obstructive pulmonary disease (HCC)   Pulmonary hypertension (HCC)   Acute on chronic combined systolic (congestive) and diastolic (congestive) heart failure (HCC)   History of anemia due to chronic kidney disease   Prolonged QT interval   Acute CHF (congestive heart failure) (HCC)   Acute on chronic combined congestive heart failure -   Echocardiogram on 06/22/2019 EF 35%, grade 3 diastolic dysfunction, moderate pulmonary hypertension, severely enlarged right ventricle.  Cardiology and nephrology are following.   Lasix infusion was stopped today.  Started on oral torsemide. Net fluid balance is - 9.3 L to date, 1400 cc output yesterday. Continue Coreg and spironolactone.  Hold Entresto. Diuresis changes per cardiology and nephrology.  AKI on CKD stage IIIb -AKI present on admission, likely secondary to CHF and cardiorenal syndrome.  Improving with IV diuresis.  Nephrology is following.  Renal  ultrasound was negative for obstruction.   Prolonged QT interval - POA with QTc 507 on EKG.  Avoid QT-prolonging agents as much as possible.  Stopped tramadol.  Monitor closely.  Maintain K>4.0 and Mg>2.0.    Bilateral foot pain - unclear if due to tense edema in her feet, or worsened neuropathy.  Already on gabapentin, will not increase given renal function.  PRN Norco or Tylenol.  Elevate legs.  Acute metabolic encephalopathy - with confusion, intermittent agitation.  Suspect underlying psychiatric disorder.    Anemia of chronic disease - stable, no evidence of bleeding.  Monitor CBC.  Hypertension -continue Coreg.    Hold Entresto given AKI.  History of ventricular thrombus -continue Xarelto.  Social - Homeless.  Severe medical issues.    APS is on board and pursuing guardianship and placement.  APS requires psychiatric evaluation, consult order has been placed.  We will have to be seen by rounding psych provider over the weekend. Last admission, was discharged to SNF/rehab, but signed herself out.  SNF placement unlikely given ongoing drug abuse.  Patient would have to agree to substance abuse rehab.    Obesity: Body mass index is 37.65 kg/m.  Complicates overall care and prognosis.  DVT prophylaxis:  rivaroxaban (XARELTO) tablet 20 mg   Diet:  Diet Orders (From admission, onward)    Start     Ordered   07/27/19 0209  Diet 2 gram sodium Room service appropriate? Yes; Fluid consistency: Thin  Diet effective now       Question Answer Comment  Room service appropriate? Yes   Fluid consistency: Thin      07/27/19 0228  Code Status: Full Code    Subjective 08/07/19    Patient seen this morning at bedside.  No acute events reported.  Says she is tired and keeps falling asleep.  Denies chest pain or shortness of breath, fevers or chills, nausea vomiting.  Says leg swelling is better but her feet still are tight.  Denies other acute complaints.  Disposition Plan &  Communication   Status is: Inpatient  Remains inpatient appropriate because:IV treatments appropriate due to intensity of illness or inability to take PO.  Patient continues to be volume overloaded at this time.  Also require psychiatric evaluation per APS who are pursuing guardianship and placement.   Dispo:  Patient From: Home  Planned Disposition: To be determined pending psych eval and APS  Expected discharge date: > 3 days  Medically stable for discharge: No     Family Communication: None at bedside, will attempt to call   Consults, Procedures, Significant Events   Consultants:   Nephrology  Cardiology  Procedures:   None  Antimicrobials:   None   Objective   Vitals:   08/07/19 0700 08/07/19 0759 08/07/19 1203 08/07/19 1608  BP: (!) 89/67 92/67 (!) 84/53 (!) 90/60  Pulse: 79 79 67 75  Resp: 16 17 18 18   Temp: 98.8 F (37.1 C) 98.5 F (36.9 C) 98.9 F (37.2 C) 98.9 F (37.2 C)  TempSrc: Oral   Oral  SpO2: 98% 96% 98% 93%  Weight: (!) 109 kg     Height:        Intake/Output Summary (Last 24 hours) at 08/07/2019 1634 Last data filed at 08/07/2019 1205 Gross per 24 hour  Intake 611.54 ml  Output 2100 ml  Net -1488.46 ml   Filed Weights   08/05/19 0514 08/06/19 0352 08/07/19 0700  Weight: (!) 108 kg (!) 108.7 kg (!) 109 kg    Physical Exam:  General exam: awake but appears drowsy, alert, no acute distress Respiratory system: CTAB with diminished bases, no wheezes or rhonchi, normal respiratory effort. Cardiovascular system: normal S1/S2, RRR, lower extremity edema improved, feet with tense edema.   Central nervous system: A&O x3. no gross focal neurologic deficits Extremities: moves all, normal tone,  Skin: dry, intact, normal temperature Psychiatry: normal mood, congruent affect  Labs   Data Reviewed: I have personally reviewed following labs and imaging studies  CBC: Recent Labs  Lab 08/02/19 0932 08/03/19 0523 08/04/19 0519  08/05/19 0412 08/06/19 0552  WBC 6.4 7.7 8.1 9.6 10.3  NEUTROABS 4.3 5.2 5.5 6.6  --   HGB 8.9* 9.2* 8.4* 7.8* 8.5*  HCT 30.2* 31.6* 27.0* 26.6* 27.0*  MCV 71.2* 71.7* 68.4* 70.6* 69.1*  PLT 313 320 314 339 384   Basic Metabolic Panel: Recent Labs  Lab 08/02/19 0932 08/02/19 0932 08/03/19 0523 08/04/19 0519 08/05/19 0412 08/06/19 0552 08/07/19 0613  NA 137   < > 139 137 134* 135 134*  K 3.7   < > 3.5 3.9 3.3* 4.1 4.1  CL 100   < > 101 100 96* 95* 96*  CO2 28   < > 31 29 30 30 28   GLUCOSE 106*   < > 125* 167* 157* 152* 121*  BUN 50*   < > 49* 43* 44* 45* 50*  CREATININE 2.16*   < > 1.86* 1.86* 1.86* 1.92* 1.97*  CALCIUM 8.3*   < > 8.0* 8.4* 8.2* 8.4* 8.5*  MG 1.7  --   --   --  1.6* 2.0  --  PHOS 2.7  --   --   --   --   --   --    < > = values in this interval not displayed.   GFR: Estimated Creatinine Clearance: 37.2 mL/min (A) (by C-G formula based on SCr of 1.97 mg/dL (H)). Liver Function Tests: No results for input(s): AST, ALT, ALKPHOS, BILITOT, PROT, ALBUMIN in the last 168 hours. No results for input(s): LIPASE, AMYLASE in the last 168 hours. No results for input(s): AMMONIA in the last 168 hours. Coagulation Profile: No results for input(s): INR, PROTIME in the last 168 hours. Cardiac Enzymes: No results for input(s): CKTOTAL, CKMB, CKMBINDEX, TROPONINI in the last 168 hours. BNP (last 3 results) No results for input(s): PROBNP in the last 8760 hours. HbA1C: No results for input(s): HGBA1C in the last 72 hours. CBG: No results for input(s): GLUCAP in the last 168 hours. Lipid Profile: No results for input(s): CHOL, HDL, LDLCALC, TRIG, CHOLHDL, LDLDIRECT in the last 72 hours. Thyroid Function Tests: No results for input(s): TSH, T4TOTAL, FREET4, T3FREE, THYROIDAB in the last 72 hours. Anemia Panel: No results for input(s): VITAMINB12, FOLATE, FERRITIN, TIBC, IRON, RETICCTPCT in the last 72 hours. Sepsis Labs: No results for input(s): PROCALCITON,  LATICACIDVEN in the last 168 hours.  No results found for this or any previous visit (from the past 240 hour(s)).    Imaging Studies   No results found.   Medications   Scheduled Meds: . aspirin EC  81 mg Oral Daily  . atorvastatin  20 mg Oral Daily  . carvedilol  3.125 mg Oral BID WC  . FLUoxetine  20 mg Oral Daily  . gabapentin  300 mg Oral TID  . metolazone  5 mg Oral Once per day on Mon Wed Fri  . multivitamin with minerals  1 tablet Oral Daily  . Ensure Max Protein  11 oz Oral BID  . rivaroxaban  20 mg Oral Q supper  . sodium chloride flush  3 mL Intravenous Once  . sodium chloride flush  3 mL Intravenous Q12H  . torsemide  40 mg Oral Daily   Continuous Infusions: . sodium chloride 4 mL/hr at 08/06/19 0556       LOS: 11 days    Time spent: 25 minutes    Ezekiel Slocumb, DO [facility]  08/07/2019, 4:34 PM    If 7PM-7AM, please contact night-coverage. How to contact the [facility] Attending or Consulting provider [place] or covering provider during after hours [place], for this patient?    1. Check the care team in [facility] and look for a) attending/consulting TRH provider listed and b) the [facility] team listed 2. Log into [email or web address] and use [place]'s universal password to access. If you do not have the password, please contact the hospital operator. 3. Locate the [facility] provider you are looking for under [facility] and page to a number that you can be directly reached. 4. If you still have difficulty reaching the provider, please page the [facility] (Director on Call) for the Hospitalists listed on amion for assistance.

## 2019-08-07 NOTE — Progress Notes (Signed)
[facility]  ROUNDING NOTE   Subjective:   Good UOP 07/29 0701 - 07/30 0700 In: 1091.5 [P.O.:957; I.V.:84.5; IV Piggyback:50] Out: 4163 [AGTXM:4680] States she is able to eat without nausea or vomiting Still has large amount of lower extremity edema Worried about discharge after the hospital states she is homeless  No acute c/o today   Objective:  Vital signs in last 24 hours:  Temp:  [98.5 F (36.9 C)-99.5 F (37.5 C)] 98.5 F (36.9 C) (07/30 0759) Pulse Rate:  [78-84] 79 (07/30 0759) Resp:  [15-18] 17 (07/30 0759) BP: (89-133)/(67-113) 92/67 (07/30 0759) SpO2:  [92 %-100 %] 96 % (07/30 0759) Weight:  [109 kg] 109 kg (07/30 0700)  Weight change: 0.363 kg Filed Weights   08/05/19 0514 08/06/19 0352 08/07/19 0700  Weight: (!) 108 kg (!) 108.7 kg (!) 109 kg    Intake/Output: I/O last 3 completed shifts: In: 1413.1 [P.O.:1197; I.V.:166.1; IV Piggyback:50] Out: 2350 [Urine:2350]   Intake/Output this shift:  No intake/output data recorded.  Physical Exam: General: NAD   Head: Normocephalic, atraumatic. Moist oral mucosal membranes  Eyes: Anicteric  Lungs:  Decreased breath sounds at bases, normal effort  Heart: Regular rate and rhythm  Abdomen:  Soft, nontender, BS present  Extremities: 2+ peripheral edema.  Neurologic: Nonfocal, moving all four extremities  Skin: No lesions        Basic Metabolic Panel: Recent Labs  Lab 08/02/19 0932 08/02/19 0932 08/03/19 3212 08/03/19 2482 08/04/19 0519 08/04/19 0519 08/05/19 0412 08/06/19 0552 08/07/19 0613  NA 137   < > 139  --  137  --  134* 135 134*  K 3.7   < > 3.5  --  3.9  --  3.3* 4.1 4.1  CL 100   < > 101  --  100  --  96* 95* 96*  CO2 28   < > 31  --  29  --  30 30 28   GLUCOSE 106*   < > 125*  --  167*  --  157* 152* 121*  BUN 50*   < > 49*  --  43*  --  44* 45* 50*  CREATININE 2.16*   < > 1.86*  --  1.86*  --  1.86* 1.92* 1.97*  CALCIUM 8.3*   < > 8.0*   < > 8.4*   < > 8.2* 8.4* 8.5*  MG  1.7  --   --   --   --   --  1.6* 2.0  --   PHOS 2.7  --   --   --   --   --   --   --   --    < > = values in this interval not displayed.    Liver Function Tests: No results for input(s): AST, ALT, ALKPHOS, BILITOT, PROT, ALBUMIN in the last 168 hours. No results for input(s): LIPASE, AMYLASE in the last 168 hours. No results for input(s): AMMONIA in the last 168 hours.  CBC: Recent Labs  Lab 08/02/19 0932 08/03/19 0523 08/04/19 0519 08/05/19 0412 08/06/19 0552  WBC 6.4 7.7 8.1 9.6 10.3  NEUTROABS 4.3 5.2 5.5 6.6  --   HGB 8.9* 9.2* 8.4* 7.8* 8.5*  HCT 30.2* 31.6* 27.0* 26.6* 27.0*  MCV 71.2* 71.7* 68.4* 70.6* 69.1*  PLT 313 320 314 339 352    Cardiac Enzymes: No results for input(s): CKTOTAL, CKMB, CKMBINDEX, TROPONINI in the last 168 hours.  BNP: Invalid input(s): POCBNP  CBG: No results  for input(s): GLUCAP in the last 168 hours.  Microbiology: Results for orders placed or performed during the hospital encounter of 06/19/19  SARS Coronavirus 2 by RT PCR (hospital order, performed in [facility] hospital lab) Nasopharyngeal Nasopharyngeal Swab     Status: None   Collection Time: 06/19/19  9:28 AM   Specimen: Nasopharyngeal Swab  Result Value Ref Range Status   SARS Coronavirus 2 NEGATIVE NEGATIVE Final    Comment: (NOTE) SARS-CoV-2 target nucleic acids are NOT DETECTED.  The SARS-CoV-2 RNA is generally detectable in upper and lower respiratory specimens during the acute phase of infection. The lowest concentration of SARS-CoV-2 viral copies this assay can detect is 250 copies / mL. A negative result does not preclude SARS-CoV-2 infection and should not be used as the sole basis for treatment or other patient management decisions.  A negative result may occur with improper specimen collection / handling, submission of specimen other than nasopharyngeal swab, presence of viral mutation(s) within the areas targeted by this assay, and inadequate number of viral  copies (<250 copies / mL). A negative result must be combined with clinical observations, patient history, and epidemiological information.  Fact Sheet for Patients:   StrictlyIdeas.no  Fact Sheet for Healthcare Providers: BankingDealers.co.za  This test is not yet approved or  cleared by the [place] FDA and has been authorized for detection and/or diagnosis of SARS-CoV-2 by FDA under an Emergency Use Authorization (EUA).  This EUA will remain in effect (meaning this test can be used) for the duration of the COVID-19 declaration under Section 564(b)(1) of the Act, 21 U.S.C. section 360bbb-3(b)(1), unless the authorization is terminated or revoked sooner.  Performed at [facility], [address] [place], [place] [address]     Coagulation Studies: No results for input(s): LABPROT, INR in the last 72 hours.  Urinalysis: No results for input(s): COLORURINE, LABSPEC, PHURINE, GLUCOSEU, HGBUR, BILIRUBINUR, KETONESUR, PROTEINUR, UROBILINOGEN, NITRITE, LEUKOCYTESUR in the last 72 hours.  Invalid input(s): APPERANCEUR    Imaging: No results found.   Medications:   . sodium chloride 4 mL/hr at 08/06/19 0556   . aspirin EC  81 mg Oral Daily  . atorvastatin  20 mg Oral Daily  . carvedilol  3.125 mg Oral BID WC  . FLUoxetine  20 mg Oral Daily  . gabapentin  300 mg Oral TID  . multivitamin with minerals  1 tablet Oral Daily  . Ensure Max Protein  11 oz Oral BID  . rivaroxaban  20 mg Oral Q supper  . sodium chloride flush  3 mL Intravenous Once  . sodium chloride flush  3 mL Intravenous Q12H  . torsemide  40 mg Oral Daily   sodium chloride, acetaminophen, ALPRAZolam, sodium chloride flush, traMADol  Assessment/ Plan:  [name] is a 64 y.o. black female with hypertension, hyperlipidemia, depression , polysubstance abuse, recent COVID-19 infection who was admitted to [facility] on 07/26/2019 for Acute CHF  (congestive heart failure) ([place]) [I50.9] Acute on chronic congestive heart failure, unspecified heart failure type ([place]) [I50.9]  1. Acute renal failure on chronic kidney disease stage IIIB with proteinuria: baseline creatinine 1.7, GFR of 32 on 03/08/19. Suspect patient may have progression of chronic kidney disease from multiple episodes of acute kidney injury with limited recovery.  Chronic kidney disease has been thought to be secondary to hypertension in the past.  Acute renal failure secondary to acute cardiorenal syndrome.  No IV contrast exposure. Renal ultrasound without obstruction.  Shows diffuse increased echogenicity  of the bilateral kidneys.  - patient continues to have fair urine output  07/29 0701 - 07/30 0700 In: 1091.5 [P.O.:957; I.V.:84.5; IV Piggyback:50] Out: 1499 [Urine:1650] Lab Results  Component Value Date   CREATININE 1.97 (H) 08/07/2019   CREATININE 1.92 (H) 08/06/2019   CREATININE 1.86 (H) 08/05/2019      2. Acute exacerbation of chronic systolic and diastolic congestive heart failure and generalized edema: echocardiogram on 06/22/19.  EF of 25 to 69%, grade 3 diastolic dysfunction.  Moderately elevated pulmonary artery systolic pressure, right ventricle severely enlarged.  Estimated RVSP 60 mm suggesting pulmonary hypertension Continue oral torsemide Metolazone 3 x per week - avoid hypotension  07/29 0701 - 07/30 0700 In: 1091.5 [P.O.:957; I.V.:84.5; IV Piggyback:50] Out: 2493 [SUNHR:1444]    LOS: [place] 7/30/202110:04 AM

## 2019-08-07 NOTE — Progress Notes (Signed)
Patient very agitated this afternoon. Complaining of foot,knee pain. Both assessed and no obvious issues exteriorly. Yelling out from room. Contacted MD for additional PRN meds from MD. MD placing order. Will continue to assess her situation.

## 2019-08-07 NOTE — Progress Notes (Signed)
Mobility Specialist - Progress Note   08/07/19 1300  Mobility  Activity Dangled on edge of bed  Level of Assistance Modified independent, requires aide device or extra time  Mobility performed by Mobility specialist  $Mobility charge 1 Mobility    Pre-mobility: 68 HR, 83/60 BP, 100% SpO2 Post-mobility: 73 HR, 93/67 BP, 98% SpO2    Pt was in bed with nurse present in room upon arrival. Pt was encouraged by nurse to participate with mobility specialists. Pt agreed. Per discussion w/ nurse, pt had pain pills prior to arrival, therefore BP levels were expected to be low, moderate mobility can still be appropriate. Pt was able to get to EOB mod. Independent. Pt c/o pain in lower L leg, but claims she can tolerate it. Pt was able to complete EOB exercise mod. Independent (seated marching, seated kicks). Noticed bed soiled. Nurse and NT notified. Pt agreed to transfer to recliner to have bedsheets replaced, however, was not able to, even with assist from 3 staff members. Pain in lower L leg limited pt mobility to stand/transfer. Per discussion w/ nurse, pt was able to sit-to-stand min assist w/ NT a couple days ago. Overall, pt tolerated session well. Pt was left in bed with call bell/phone in reach.    Kaysen Deal Mobility Specialist  08/07/19, 1:20 PM

## 2019-08-07 NOTE — Progress Notes (Signed)
CCMD called to report patient had a 9 beat run on Vtach. Went to room and assessed patient. Patient asymptomatic resting in bed.

## 2019-08-08 DIAGNOSIS — R9431 Abnormal electrocardiogram [ECG] [EKG]: Secondary | ICD-10-CM

## 2019-08-08 DIAGNOSIS — I272 Pulmonary hypertension, unspecified: Secondary | ICD-10-CM

## 2019-08-08 LAB — BASIC METABOLIC PANEL
Anion gap: 12 (ref 5–15)
BUN: 53 mg/dL — ABNORMAL HIGH (ref 8–23)
CO2: 31 mmol/L (ref 22–32)
Calcium: 8.8 mg/dL — ABNORMAL LOW (ref 8.9–10.3)
Chloride: 91 mmol/L — ABNORMAL LOW (ref 98–111)
Creatinine, Ser: 1.95 mg/dL — ABNORMAL HIGH (ref 0.44–1.00)
GFR calc Af Amer: 31 mL/min — ABNORMAL LOW (ref >60)
GFR calc non Af Amer: 27 mL/min — ABNORMAL LOW (ref >60)
Glucose, Bld: 165 mg/dL — ABNORMAL HIGH (ref 70–99)
Potassium: 3.4 mmol/L — ABNORMAL LOW (ref 3.5–5.1)
Sodium: 134 mmol/L — ABNORMAL LOW (ref 135–145)

## 2019-08-08 LAB — TROPONIN I (HIGH SENSITIVITY)
Troponin I (High Sensitivity): 210 ng/L (ref <18)
Troponin I (High Sensitivity): 199 ng/L (ref <18)

## 2019-08-08 LAB — RETICULOCYTES
Immature Retic Fract: 29.2 % — ABNORMAL HIGH (ref 2.3–15.9)
RBC.: 4.13 MIL/uL (ref 3.87–5.11)
Retic Count, Absolute: 54.9 10*3/uL (ref 19.0–186.0)
Retic Ct Pct: 1.3 % (ref 0.4–3.1)

## 2019-08-08 LAB — FERRITIN: Ferritin: 113 ng/mL (ref 11–307)

## 2019-08-08 LAB — IRON AND TIBC
Iron: 14 ug/dL — ABNORMAL LOW (ref 28–170)
Saturation Ratios: 6 % — ABNORMAL LOW (ref 10.4–31.8)
TIBC: 230 ug/dL — ABNORMAL LOW (ref 250–450)
UIBC: 216 ug/dL

## 2019-08-08 LAB — MAGNESIUM: Magnesium: 2.2 mg/dL (ref 1.7–2.4)

## 2019-08-08 LAB — FOLATE: Folate: 8.8 ng/mL (ref >5.9)

## 2019-08-08 LAB — VITAMIN B12: Vitamin B-12: 542 pg/mL (ref 180–914)

## 2019-08-08 MED ORDER — MIDODRINE HCL 5 MG PO TABS
2.5000 mg | ORAL_TABLET | Freq: Three times a day (TID) | ORAL | Status: DC
  Administered 2019-08-08 – 2019-09-02 (×73): 2.5 mg via ORAL
  Filled 2019-08-08 (×2): qty 0.5
  Filled 2019-08-08: qty 1
  Filled 2019-08-08: qty 0.5
  Filled 2019-08-08 (×4): qty 1
  Filled 2019-08-08: qty 0.5
  Filled 2019-08-08: qty 1
  Filled 2019-08-08 (×2): qty 0.5
  Filled 2019-08-08 (×4): qty 1
  Filled 2019-08-08 (×4): qty 0.5
  Filled 2019-08-08: qty 1
  Filled 2019-08-08 (×2): qty 0.5
  Filled 2019-08-08 (×2): qty 1
  Filled 2019-08-08 (×5): qty 0.5
  Filled 2019-08-08: qty 1
  Filled 2019-08-08 (×4): qty 0.5
  Filled 2019-08-08: qty 1
  Filled 2019-08-08 (×2): qty 0.5
  Filled 2019-08-08: qty 1
  Filled 2019-08-08: qty 0.5
  Filled 2019-08-08 (×2): qty 1
  Filled 2019-08-08: qty 0.5
  Filled 2019-08-08 (×2): qty 1
  Filled 2019-08-08 (×3): qty 0.5
  Filled 2019-08-08 (×2): qty 1
  Filled 2019-08-08 (×10): qty 0.5
  Filled 2019-08-08: qty 1
  Filled 2019-08-08: qty 0.5
  Filled 2019-08-08: qty 1
  Filled 2019-08-08 (×2): qty 0.5
  Filled 2019-08-08: qty 1
  Filled 2019-08-08 (×4): qty 0.5
  Filled 2019-08-08: qty 1
  Filled 2019-08-08 (×6): qty 0.5
  Filled 2019-08-08: qty 1
  Filled 2019-08-08: qty 0.5

## 2019-08-08 NOTE — Progress Notes (Signed)
Patient complaining of sharp pain in left shoulder across chest then complains of pain in legs instead. Notified MD via chat. Gave patient PRN hydrocone. Refused to take with water, insisted she have juice instead of water before she would take pain meds for pain she rated 12 of 10. Screaming out into hallway. Yelling out in room for the "Lord to help her crossover." Vitals Temp 97.6 , BP 104/73, HR 84, Respirations 18. Patient seems to have episodes like this on Friday ad Today about the same time around 4 or 5 pm. I will continue to monitor and assess patient and pain level/location.

## 2019-08-08 NOTE — Progress Notes (Signed)
   08/08/19 1642  Vitals  Temp 97.6 F (36.4 C)  BP 104/73  MAP (mmHg) 83  BP Location Right Arm  BP Method Automatic  Patient Position (if appropriate) Lying  Pulse Rate 84  Pulse Rate Source Monitor  Resp 18  MEWS COLOR  MEWS Score Color Green  Oxygen Therapy  SpO2 100 %  O2 Device Room Air  MEWS Score  MEWS Temp 0  MEWS Systolic 0  MEWS Pulse 0  MEWS RR 0  MEWS LOC 0  MEWS Score 0

## 2019-08-08 NOTE — Progress Notes (Addendum)
Katelyn Lane  MRN: [id number]  DOB/AGE: 1955/11/28 64 y.o.  Primary Care Physician:Lane, Katelyn Jarvis, MD  Admit date: 07/26/2019  Chief Complaint:  Chief Complaint  Patient presents with   Loss of Consciousness   Leg Swelling    S-Pt presented on  07/26/2019 with  Chief Complaint  Patient presents with   Loss of Consciousness   Leg Swelling  .    Pt today feels better.  Patient is lying comfortably in the bed  Medications  aspirin EC  81 mg Oral Daily   atorvastatin  20 mg Oral Daily   carvedilol  3.125 mg Oral BID WC   FLUoxetine  20 mg Oral Daily   gabapentin  300 mg Oral TID   metolazone  5 mg Oral Once per day on Mon Wed Fri   midodrine  2.5 mg Oral TID WC   multivitamin with minerals  1 tablet Oral Daily   Ensure Max Protein  11 oz Oral BID   rivaroxaban  20 mg Oral Q supper   sodium chloride flush  3 mL Intravenous Once   sodium chloride flush  3 mL Intravenous Q12H   torsemide  40 mg Oral Daily         CMK:LKJZP from the symptoms mentioned above,there are no other symptoms referable to all systems reviewed.  Physical Exam: Vital signs in last 24 hours: Temp:  [97.6 F (36.4 C)-99.8 F (37.7 C)] 97.6 F (36.4 C) (07/31 1642) Pulse Rate:  [68-84] 84 (07/31 1642) Resp:  [17-20] 18 (07/31 1642) BP: (87-104)/(63-73) 104/73 (07/31 1642) SpO2:  [91 %-100 %] 100 % (07/31 1642) Weight:  [107.7 kg] 107.7 kg (07/31 0333) Weight change: -1.345 kg Last BM Date: 08/05/19  Intake/Output from previous day: 07/30 0701 - 07/31 0700 In: 90 [P.O.:490] Out: 2250 [Urine:2250] Total I/O In: 360 [P.O.:360] Out: 1400 [Urine:1400]   Physical Exam: General- pt is awake,alert, oriented to time place and person Resp- No acute REsp distress, CTA B/L NO Rhonchi CVS- S1S2 regular in rate and rhythm GIT- BS+, soft, NT, ND EXT- NO LE Edema, Cyanosis, chronic venous stasis changes present   Lab Results: CBC Recent Labs    08/06/19 0552   WBC 10.3  HGB 8.5*  HCT 27.0*  PLT 352    BMET Recent Labs    08/06/19 0552 08/07/19 0613  NA 135 134*  K 4.1 4.1  CL 95* 96*  CO2 30 28  GLUCOSE 152* 121*  BUN 45* 50*  CREATININE 1.92* 1.97*  CALCIUM 8.4* 8.5*   Creatinine trend  2021 2.0-->2.9==>2.0 Patient has had admissions nearly every month this year January admission/February admission/March admission/April admission/May admission1.7--2.4 2020 1.7--2.0 2014 1.54   MICRO No results found for this or any previous visit (from the past 240 hour(s)).    Lab Results  Component Value Date   CALCIUM 8.5 (L) 08/07/2019   PHOS 2.7 08/02/2019               Impression: Patient is a 64 year old African-American female with a past medical history of hypertension, hyperlipidemia, depression, polysubstance abuse, recent Covid infection who was admitted to the hospital on July 18 with chief complaint of acute CHF, AKI on CKD   1)Renal  AKI secondary to ATN/cardiorenal syndrome Patient has AKI on CKD Patient has CKD since 2014 Patient has CKD most likely secondary to hypertension/cocaine abuse Patient CKD progression has been marked with multiple episodes of AKI   2) hypotension Patient is on midodrine  3)Anemia of  chronic disease?  HGb is not at goal (9--11)   4) vitamin D deficiency Patient has history of vitamin D deficiency in the past-level was 24 in February 2020   5)Proteinuria Patient has nonnephrotic range proteinuria most likely secondary to hypertension superimposed with cocaine abuse  6) electrolytes   sodium Hyponatremia Secondary to fluid overload  potassium Normokalemic    7)Acid base Co2 at goal   8) acute on chronic CHF Patient is now clinically better Patient is currently being diuresed-Patient transitioned from IV diuretics to p.o. diuretics  Patient is nearly 10 L negative   Plan:  We will ask for anemia work-up We will continue the current  diuretics.    Katelyn Lane s [name] 08/08/2019, 5:37 PM

## 2019-08-08 NOTE — Progress Notes (Signed)
APS, Case worker and Dr Arbutus Ped met with patient at bedside Friday morning. Patient has shown a significant change in temperament and behavior since. Increased agitation, refusing to speak, yelling out from room. Today is my fourth day this week with Katelyn Lane. My assessment of her behavior is that once she realized she would be leaving potentially, rapid changes occurred in behavior. I am assessing her frequently and using calming techniques with her such as relaxing music and accommodating her request as a can. Now requesting I call and order her meals and then refusing to speak when asked what she would like. I will note her progress in behavior continually.

## 2019-08-08 NOTE — Progress Notes (Signed)
PROGRESS NOTE    Katelyn Lane   VZC:[id number]  DOB: 1955/02/06  PCP: [name], MD    DOA: 07/26/2019 LOS: 65   Brief Narrative   64 year old female with known history of chronic systolic heart failure, recent COVID-19 infection, hyperlipidemia and hypertension, stage IIIb chronic kidney disease presented to the emergency room with acute onset of worsening dyspnea associated with dry cough wheezing, orthopnea and PND as well lower extremity edema for 1 week.  Patient had previous admissions with noncompliance.  She smokes cocaine and she is unfortunately homeless. At the emergency room vitals were stable.  BUN 52/creatinine 2.39.  BNP 4500.  Chest x-ray showed cardiomegaly and interstitial pulmonary edema.  Patient was given IV Lasix and admission requested for CHF exacerbation.     Assessment & Plan   Active Problems:   Hyperlipidemia   Depression   CKD (chronic kidney disease), stage III   Acute metabolic encephalopathy   History of substance abuse (HCC)   Chronic obstructive pulmonary disease (HCC)   Pulmonary hypertension (HCC)   Acute on chronic combined systolic (congestive) and diastolic (congestive) heart failure (HCC)   History of anemia due to chronic kidney disease   Prolonged QT interval   Acute CHF (congestive heart failure) ([place])  Chest pain - reported today (7/31), waxing/waning over past few days, but not previously mentioned.  Will check a 12 lead and troponin.  Seems unlikely ACS.  Monitor closely.  Acute on chronic combined congestive heart failure -   Echocardiogram on 06/22/2019 EF 12%, grade 3 diastolic dysfunction, moderate pulmonary hypertension, severely enlarged right ventricle.  Cardiology and nephrology are following.   Lasix infusion was stopped today.  Started on oral torsemide. Net fluid balance is - 9.3 L to date, 1400 cc output yesterday. Continue Coreg and spironolactone.  Hold Entresto. Diuresis changes per cardiology and  nephrology.  AKI on CKD stage IIIb -AKI present on admission, likely secondary to CHF and cardiorenal syndrome.  Improving with IV diuresis.  Nephrology is following.  Renal ultrasound was negative for obstruction.   Hypotension - due to ongoing diuresis and beta blocker.  Added low dose midodrine.  Hx of Hypertension -recently hypotensive.  Continue Coreg but hold for hypotension or bradycardia.  Hold Entresto given AKI.  Prolonged QT interval - POA with QTc 507 on EKG.  Avoid QT-prolonging agents as much as possible.  Stopped tramadol.  Monitor closely.  Maintain K>4.0 and Mg>2.0.    Nonsustained V-tach - has history during previous admissions as well.  Monitor on telemetry.  Maintain K>4.0 and Mg>2.0.  Bilateral foot pain - unclear if due to tense edema in her feet, or worsened neuropathy.  Already on gabapentin, will not increase given renal function.  PRN Norco or Tylenol.  Elevate legs.  Right foot edema and pain are better today (7/31), but left still tense and painful to palpation.  Acute metabolic encephalopathy - with confusion, intermittent agitation.  Suspect underlying psychiatric disorder.  Psych consult pending as of 7/30.  Anemia of chronic disease - stable, no evidence of bleeding.  Monitor CBC.  History of ventricular thrombus -continue Xarelto.  Social - Homeless.  Severe medical issues.    APS is on board and pursuing guardianship and placement.  APS requires psychiatric evaluation, consult order has been placed.  We will have to be seen by rounding psych provider over the weekend. Last admission, was discharged to SNF/rehab, but signed herself out.  SNF placement unlikely given ongoing drug abuse.  Patient  would have to agree to substance abuse rehab.    Obesity: Body mass index is 37.19 kg/m.  Complicates overall care and prognosis.  DVT prophylaxis:  rivaroxaban (XARELTO) tablet 20 mg   Diet:  Diet Orders (From admission, onward)    Start     Ordered    07/27/19 0209  Diet 2 gram sodium Room service appropriate? Yes; Fluid consistency: Thin  Diet effective now       Question Answer Comment  Room service appropriate? Yes   Fluid consistency: Thin      07/27/19 0228            Code Status: Full Code    Subjective 08/08/19    Patient seen this morning at bedside.  No acute events reported overnight, but has been having behavior changes and episodes of agitation for past couple of days.  Today she says she feels crummy.  She specifically mentions chest pain radiating to her left neck/shoulder.  Says it's been coming and going for past few days, but this is the first she has mentioned to me.  Denies shortness of breath, n/v, f/c or other complaints. Does say right foot feels better, left foot still hurt.  Disposition Plan & Communication   Status is: Inpatient  Remains inpatient appropriate because:IV treatments appropriate due to intensity of illness or inability to take PO.  Patient continues to be volume overloaded at this time.  Also requires psychiatric evaluation per APS who are pursuing guardianship and placement.   Dispo:  Patient From: Home  Planned Disposition: To be determined pending psych eval and APS  Expected discharge date: > 3 days  Medically stable for discharge: No    Family Communication: None at bedside, will attempt to call   Consults, Procedures, Significant Events   Consultants:   Nephrology  Cardiology  Psychiatry  Procedures:   None  Antimicrobials:   None   Objective   Vitals:   08/07/19 1608 08/07/19 1943 08/08/19 0333 08/08/19 0748  BP: (!) 90/60 (!) 87/63 96/72 99/66   Pulse: 75 74 74 73  Resp: 18 20 20 17   Temp: 98.9 F (37.2 C) 99.8 F (37.7 C) 97.7 F (36.5 C) 98.2 F (36.8 C)  TempSrc: Oral  Oral Oral  SpO2: 93% 98% 91% 97%  Weight:   (!) 107.7 kg   Height:        Intake/Output Summary (Last 24 hours) at 08/08/2019 0923 Last data filed at 08/08/2019 0747 Gross per 24  hour  Intake 490 ml  Output 2550 ml  Net -2060 ml   Filed Weights   08/06/19 0352 08/07/19 0700 08/08/19 0333  Weight: (!) 108.7 kg (!) 109 kg (!) 107.7 kg    Physical Exam:  General exam: awake & alert, no acute distress Respiratory system: CTAB with diminished bases, no wheezes or rhonchi, normal respiratory effort. Cardiovascular system: normal S1/S2, RRR, 2+ pitting edema BLE's, R foot edema better and non-tender, L foot still tense with swelling and painful to touch.   Central nervous system: A&O x3. no gross focal neurologic deficits Extremities: moves all, normal tone,  Skin: dry, intact, normal temperature Psychiatry: normal mood, congruent affect  Labs   Data Reviewed: I have personally reviewed following labs and imaging studies  CBC: Recent Labs  Lab 08/02/19 0932 08/03/19 0523 08/04/19 0519 08/05/19 0412 08/06/19 0552  WBC 6.4 7.7 8.1 9.6 10.3  NEUTROABS 4.3 5.2 5.5 6.6  --   HGB 8.9* 9.2* 8.4* 7.8* 8.5*  HCT 30.2*  31.6* 27.0* 26.6* 27.0*  MCV 71.2* 71.7* 68.4* 70.6* 69.1*  PLT 313 320 314 339 637   Basic Metabolic Panel: Recent Labs  Lab 08/02/19 0932 08/02/19 0932 08/03/19 0523 08/04/19 0519 08/05/19 0412 08/06/19 0552 08/07/19 0613  NA 137   < > 139 137 134* 135 134*  K 3.7   < > 3.5 3.9 3.3* 4.1 4.1  CL 100   < > 101 100 96* 95* 96*  CO2 28   < > 31 29 30 30 28   GLUCOSE 106*   < > 125* 167* 157* 152* 121*  BUN 50*   < > 49* 43* 44* 45* 50*  CREATININE 2.16*   < > 1.86* 1.86* 1.86* 1.92* 1.97*  CALCIUM 8.3*   < > 8.0* 8.4* 8.2* 8.4* 8.5*  MG 1.7  --   --   --  1.6* 2.0  --   PHOS 2.7  --   --   --   --   --   --    < > = values in this interval not displayed.   GFR: Estimated Creatinine Clearance: 36.9 mL/min (A) (by C-G formula based on SCr of 1.97 mg/dL (H)). Liver Function Tests: No results for input(s): AST, ALT, ALKPHOS, BILITOT, PROT, ALBUMIN in the last 168 hours. No results for input(s): LIPASE, AMYLASE in the last 168 hours. No  results for input(s): AMMONIA in the last 168 hours. Coagulation Profile: No results for input(s): INR, PROTIME in the last 168 hours. Cardiac Enzymes: No results for input(s): CKTOTAL, CKMB, CKMBINDEX, TROPONINI in the last 168 hours. BNP (last 3 results) No results for input(s): PROBNP in the last 8760 hours. HbA1C: No results for input(s): HGBA1C in the last 72 hours. CBG: No results for input(s): GLUCAP in the last 168 hours. Lipid Profile: No results for input(s): CHOL, HDL, LDLCALC, TRIG, CHOLHDL, LDLDIRECT in the last 72 hours. Thyroid Function Tests: No results for input(s): TSH, T4TOTAL, FREET4, T3FREE, THYROIDAB in the last 72 hours. Anemia Panel: No results for input(s): VITAMINB12, FOLATE, FERRITIN, TIBC, IRON, RETICCTPCT in the last 72 hours. Sepsis Labs: No results for input(s): PROCALCITON, LATICACIDVEN in the last 168 hours.  No results found for this or any previous visit (from the past 240 hour(s)).    Imaging Studies   No results found.   Medications   Scheduled Meds: . aspirin EC  81 mg Oral Daily  . atorvastatin  20 mg Oral Daily  . carvedilol  3.125 mg Oral BID WC  . FLUoxetine  20 mg Oral Daily  . gabapentin  300 mg Oral TID  . metolazone  5 mg Oral Once per day on Mon Wed Fri  . midodrine  2.5 mg Oral TID WC  . multivitamin with minerals  1 tablet Oral Daily  . Ensure Max Protein  11 oz Oral BID  . rivaroxaban  20 mg Oral Q supper  . sodium chloride flush  3 mL Intravenous Once  . sodium chloride flush  3 mL Intravenous Q12H  . torsemide  40 mg Oral Daily   Continuous Infusions: . sodium chloride 4 mL/hr at 08/06/19 0556       LOS: 12 days    Time spent: 30 minutes    Ezekiel Slocumb, DO [facility]  08/08/2019, 9:23 AM    If 7PM-7AM, please contact night-coverage. How to contact the [facility] Attending or Consulting provider [place] or covering provider during after hours [place], for this patient?  1. Check the care team  in [facility] and look for a) attending/consulting [facility] provider listed and b) the [facility] team listed 2. Log into [email or web address] and use [place]'s universal password to access. If you do not have the password, please contact the hospital operator. 3. Locate the [facility] provider you are looking for under [facility] and page to a number that you can be directly reached. 4. If you still have difficulty reaching the provider, please page the [facility] (Director on Call) for the Hospitalists listed on amion for assistance.

## 2019-08-08 NOTE — Consult Note (Addendum)
[facility] Face-to-Face Psychiatry Consult   Reason for Consult: Psychiatric evaluation Referring Physician:  Dr. Arbutus Ped Patient Identification: Katelyn Lane MRN:  [id number] Principal Diagnosis: <principal problem not specified> Diagnosis:  Unspecified depressive disorder, in remission  Cocaine use disorder Tobacco use disorder Homelessness    Active Problems:   Hyperlipidemia   Depression   CKD (chronic kidney disease), stage III   Acute metabolic encephalopathy   History of substance abuse ([place])   Chronic obstructive pulmonary disease (HCC)   Pulmonary hypertension (HCC)   Acute on chronic combined systolic (congestive) and diastolic (congestive) heart failure (HCC)   History of anemia due to chronic kidney disease   Prolonged QT interval   Acute CHF (congestive heart failure) ([place])   Total Time spent with patient: 1 hour  HPI:    Patient is a 64 year old homeless, unemployed, single African-American female who was born and raised in [place].  She was admitted to the hospital on July 24, 2019 due to dyspnea in the setting of acute on chronic systolic and diastolic congestive heart failure.  There is indicate that she has a history of depression and is currently taking Prozac.  Patient says that she was never really diagnosed with depression and that Prozac really does not help.  States that she had been living with a friend prior to coming in here for 22 years but they had a falling apart and is now currently homeless.  She is not does not know where she will be going.  Patient says that she would be open to a long-term residential facility or placement.  She had reported to the nurse that she would want to be homeless instead so she could continue cocaine use.  Patient does have a history of cocaine use but says that she has not used in 5 to 6 weeks.  She smokes tobacco but no other drugs, according to her says that she has been feeling okay emotionally, and denies depression  irritability or exacerbation of anxiety.  Denies a history of mania/hypomania hallucinations and delusions.  Denies having any stressors.  Says that she does not have any support from family and friend, never has.  She has been eating okay.  She does have a prolonged history of sleep issues.  Denies a history of externalizing anger, says rather she gets angry at herself.  She does occasionally yell here but says that she does not get attention.  Yesterday, she refused to talk at times.  Does not able to tell me exactly why this was.  She denies suicidal or homicidal ideations.  She expresses strong religious convictions.  Past Psychiatric History: No history of behavioral health admissions.  No history of psychiatric admissions.  No other medications other than Prozac.  Risk to Self:   Risk to Others:   Prior Inpatient Therapy:   Prior Outpatient Therapy:    Past Medical History:  Past Medical History:  Diagnosis Date  . CHF (congestive heart failure) ([place])   . COVID-19   . Hyperlipidemia   . Hypertension   . Renal disorder     Past Surgical History:  Procedure Laterality Date  . RIGHT/LEFT HEART CATH AND CORONARY ANGIOGRAPHY N/A 12/30/2018   Procedure: RIGHT/LEFT HEART CATH AND CORONARY ANGIOGRAPHY;  Surgeon: Corey Skains, MD;  Location: [facility];  Service: Cardiovascular;  Laterality: N/A;   Family History:  Family History  Problem Relation Age of Onset  . Breast cancer Neg Hx    Family Psychiatric  History:  None Social History:  Social History   Substance and Sexual Activity  Alcohol Use No  . Alcohol/week: 0.0 standard drinks     Social History   Substance and Sexual Activity  Drug Use Yes  . Types: Cocaine   Comment: 3-4 days ago    Social History   Socioeconomic History  . Marital status: Single    Spouse name: Not on file  . Number of children: Not on file  . Years of education: Not on file  . Highest education level: Not on file   Occupational History  . Not on file  Tobacco Use  . Smoking status: Current Every Day Smoker  . Smokeless tobacco: Never Used  Substance and Sexual Activity  . Alcohol use: No    Alcohol/week: 0.0 standard drinks  . Drug use: Yes    Types: Cocaine    Comment: 3-4 days ago  . Sexual activity: Not on file  Other Topics Concern  . Not on file  Social History Narrative  . Not on file   Social Determinants of Health   Financial Resource Strain:   . Difficulty of Paying Living Expenses:   Food Insecurity:   . Worried About Charity fundraiser in the Last Year:   . Arboriculturist in the Last Year:   Transportation Needs:   . Film/video editor (Medical):   Marland Kitchen Lack of Transportation (Non-Medical):   Physical Activity:   . Days of Exercise per Week:   . Minutes of Exercise per Session:   Stress:   . Feeling of Stress :   Social Connections:   . Frequency of Communication with Friends and Family:   . Frequency of Social Gatherings with Friends and Family:   . Attends Religious Services:   . Active Member of Clubs or Organizations:   . Attends Archivist Meetings:   Marland Kitchen Marital Status:    Additional Social History:    Allergies:   Allergies  Allergen Reactions  . Penicillins Hives, Rash and Other (See Comments)    Did it involve swelling of the face/tongue/throat, SOB, or low BP? No Did it involve sudden or severe rash/hives, skin peeling, or any reaction on the inside of your mouth or nose? Yes Did you need to seek medical attention at a hospital or doctor's office? Yes When did it last happen? >25 years If all above answers are "NO", may proceed with cephalosporin use.   . Ace Inhibitors Nausea And Vomiting    Labs:  Results for orders placed or performed during the hospital encounter of 07/26/19 (from the past 48 hour(s))  Basic metabolic panel     Status: Abnormal   Collection Time: 08/07/19  6:13 AM  Result Value Ref Range   Sodium 134 (L)  135 - 145 mmol/L   Potassium 4.1 3.5 - 5.1 mmol/L   Chloride 96 (L) 98 - 111 mmol/L   CO2 28 22 - 32 mmol/L   Glucose, Bld 121 (H) 70 - 99 mg/dL    Comment: Glucose reference range applies only to samples taken after fasting for at least 8 hours.   BUN 50 (H) 8 - 23 mg/dL   Creatinine, Ser 1.97 (H) 0.44 - 1.00 mg/dL   Calcium 8.5 (L) 8.9 - 10.3 mg/dL   GFR calc non Af Amer 26 (L) >60 mL/min   GFR calc Af Amer 31 (L) >60 mL/min   Anion gap 10 5 - 15    Comment: Performed at  [facility], [address] [place], [place] [address]    Current Facility-Administered Medications  Medication Dose Route Frequency Provider Last Rate Last Admin  . 0.9 %  sodium chloride infusion  250 mL Intravenous PRN Mansy, Arvella Merles, MD 4 mL/hr at 08/06/19 0556 Rate Verify at 08/06/19 0556  . acetaminophen (TYLENOL) tablet 650 mg  650 mg Oral Q4H PRN Mansy, Jan A, MD      . ALPRAZolam Duanne Moron) tablet 0.25 mg  0.25 mg Oral BID PRN Mansy, Jan A, MD   0.25 mg at 08/07/19 2111  . aspirin EC tablet 81 mg  81 mg Oral Daily Mansy, Jan A, MD   81 mg at 08/08/19 1004  . atorvastatin (LIPITOR) tablet 20 mg  20 mg Oral Daily [name], MD   20 mg at 08/08/19 1003  . carvedilol (COREG) tablet 3.125 mg  3.125 mg Oral BID WC [name], MD   3.125 mg at 08/07/19 1015  . FLUoxetine (PROZAC) capsule 20 mg  20 mg Oral Daily [name], MD   20 mg at 08/08/19 1003  . gabapentin (NEURONTIN) capsule 300 mg  300 mg Oral TID Terrilee Croak, MD   300 mg at 08/08/19 1004  . HYDROcodone-acetaminophen (NORCO/VICODIN) 5-325 MG per tablet 1-2 tablet  1-2 tablet Oral Q6H PRN Nicole Kindred A, DO   1 tablet at 08/08/19 0758  . LORazepam (ATIVAN) injection 0.5 mg  0.5 mg Intravenous Q4H PRN Nicole Kindred A, DO      . metolazone (ZAROXOLYN) tablet 5 mg  5 mg Oral Once per day on Mon Wed Fri Nicole Kindred A, DO   5 mg at 08/07/19 1631  . midodrine (PROAMATINE) tablet 2.5 mg  2.5 mg Oral TID WC Nicole Kindred A, DO   2.5 mg at 08/08/19 1004  . multivitamin with minerals tablet 1 tablet  1 tablet Oral Daily Nicole Kindred A, DO   1 tablet at 08/08/19 1004  . protein supplement (ENSURE MAX) liquid  11 oz Oral BID Nicole Kindred A, DO   237 mL at 08/06/19 1558  . rivaroxaban (XARELTO) tablet 20 mg  20 mg Oral Q supper [name], MD   20 mg at 08/07/19 1632  . sodium chloride flush (NS) 0.9 % injection 3 mL  3 mL Intravenous Once Carrie Mew, MD      . sodium chloride flush (NS) 0.9 % injection 3 mL  3 mL Intravenous Q12H Mansy, Jan A, MD   3 mL at 08/08/19 1008  . sodium chloride flush (NS) 0.9 % injection 3 mL  3 mL Intravenous PRN Mansy, Jan A, MD      . torsemide (DEMADEX) tablet 40 mg  40 mg Oral Daily Murlean Iba, MD   40 mg at 08/08/19 1005    Psychiatric Specialty Exam: Physical Exam  Review of Systems  Blood pressure 90/69, pulse 68, temperature 97.8 F (36.6 C), resp. rate 18, height 5\' 7"  (1.702 m), weight (!) 107.7 kg, SpO2 96 %.Body mass index is 37.19 kg/m.   General Appearance: Well Groomed  Eye Contact:  Good  Speech:  Negative  Volume:  Normal  Mood:   Okay  Affect:  Congruent  Thought Process:  Coherent  Orientation:  Full (Time, Place, and Person)  Thought Content:  Negative  Suicidal Thoughts:  No  Homicidal Thoughts:  No  Memory:   Normal limits  Judgement:   Poor to fair  Insight:   Fair  Psychomotor Activity:  Normal  Concentration:  Concentration: Good  Recall:  Good  Fund of Knowledge:  Good  Language:  Good  Akathisia:  Negative  Handed:  Right  AIMS (if indicated):     Assets:  Physical Health  ADL's:  Intact  Cognition:  WNL     Treatment Plan Summary: She does not meet inpatient psychiatric criteria We will not make any gastric medication changes APS is involved Collateral information obtained from the nurse Patient expresses a desire for a long-term structured setting after her hospitalization here, if  available.     Rulon Sera, MD 08/08/2019 1:10 PM

## 2019-08-09 DIAGNOSIS — Z862 Personal history of diseases of the blood and blood-forming organs and certain disorders involving the immune mechanism: Secondary | ICD-10-CM

## 2019-08-09 LAB — BASIC METABOLIC PANEL
Anion gap: 10 (ref 5–15)
BUN: 54 mg/dL — ABNORMAL HIGH (ref 8–23)
CO2: 30 mmol/L (ref 22–32)
Calcium: 8.7 mg/dL — ABNORMAL LOW (ref 8.9–10.3)
Chloride: 93 mmol/L — ABNORMAL LOW (ref 98–111)
Creatinine, Ser: 1.85 mg/dL — ABNORMAL HIGH (ref 0.44–1.00)
GFR calc Af Amer: 33 mL/min — ABNORMAL LOW (ref >60)
GFR calc non Af Amer: 28 mL/min — ABNORMAL LOW (ref >60)
Glucose, Bld: 112 mg/dL — ABNORMAL HIGH (ref 70–99)
Potassium: 3 mmol/L — ABNORMAL LOW (ref 3.5–5.1)
Sodium: 133 mmol/L — ABNORMAL LOW (ref 135–145)

## 2019-08-09 LAB — CBC
HCT: 24.1 % — ABNORMAL LOW (ref 36.0–46.0)
Hemoglobin: 7.6 g/dL — ABNORMAL LOW (ref 12.0–15.0)
MCH: 21 pg — ABNORMAL LOW (ref 26.0–34.0)
MCHC: 31.5 g/dL (ref 30.0–36.0)
MCV: 66.6 fL — ABNORMAL LOW (ref 80.0–100.0)
Platelets: 428 10*3/uL — ABNORMAL HIGH (ref 150–400)
RBC: 3.62 MIL/uL — ABNORMAL LOW (ref 3.87–5.11)
RDW: 19.8 % — ABNORMAL HIGH (ref 11.5–15.5)
WBC: 9.9 10*3/uL (ref 4.0–10.5)
nRBC: 0 % (ref 0.0–0.2)

## 2019-08-09 LAB — MAGNESIUM: Magnesium: 1.9 mg/dL (ref 1.7–2.4)

## 2019-08-09 MED ORDER — POTASSIUM CHLORIDE CRYS ER 20 MEQ PO TBCR
40.0000 meq | EXTENDED_RELEASE_TABLET | ORAL | Status: AC
  Administered 2019-08-09 (×2): 40 meq via ORAL
  Filled 2019-08-09 (×2): qty 2

## 2019-08-09 MED ORDER — POTASSIUM CHLORIDE 20 MEQ PO PACK: 20.0000 meq | PACK | Freq: Two times a day (BID) | ORAL | Status: DC

## 2019-08-09 MED ORDER — MAGNESIUM OXIDE 400 (241.3 MG) MG PO TABS
400.0000 mg | ORAL_TABLET | Freq: Every day | ORAL | Status: DC
  Administered 2019-08-09 – 2019-09-02 (×25): 400 mg via ORAL
  Filled 2019-08-09 (×27): qty 1

## 2019-08-09 MED ORDER — SODIUM CHLORIDE 0.9 % IV SOLN
200.0000 mg | INTRAVENOUS | Status: AC
  Administered 2019-08-09 – 2019-08-10 (×2): 200 mg via INTRAVENOUS
  Filled 2019-08-09 (×2): qty 10

## 2019-08-09 NOTE — Progress Notes (Addendum)
PROGRESS NOTE    Katelyn Lane   HYI:[id number]  DOB: 01-06-1956  PCP: [name], MD    DOA: 07/26/2019 LOS: 34   Brief Narrative   64 year old female with known history of chronic systolic heart failure, recent COVID-19 infection, hyperlipidemia and hypertension, stage IIIb chronic kidney disease presented to the emergency room with acute onset of worsening dyspnea associated with dry cough wheezing, orthopnea and PND as well lower extremity edema for 1 week.  Patient had previous admissions with noncompliance.  She smokes cocaine and she is unfortunately homeless. At the emergency room vitals were stable.  BUN 52/creatinine 2.39.  BNP 4500.  Chest x-ray showed cardiomegaly and interstitial pulmonary edema.  Patient was given IV Lasix and admission requested for CHF exacerbation.     Assessment & Plan   Active Problems:   Hyperlipidemia   Depression   CKD (chronic kidney disease), stage III   Acute metabolic encephalopathy   History of substance abuse (HCC)   Chronic obstructive pulmonary disease (HCC)   Pulmonary hypertension (HCC)   Acute on chronic combined systolic (congestive) and diastolic (congestive) heart failure (HCC)   History of anemia due to chronic kidney disease   Prolonged QT interval   Acute CHF (congestive heart failure) (HCC)   Chest pain - Resolved. Reported on 7/31, waxing/waning over past few days, but not previously mentioned.  Seems unlikely ACS.  Troponin 210 >>199.  12 lead appears not done yesterday.   Monitor closely.    Acute on chronic combined congestive heart failure -   Echocardiogram on 06/22/2019 EF 78%, grade 3 diastolic dysfunction, moderate pulmonary hypertension, severely enlarged right ventricle.  Cardiology and nephrology are following.   Lasix infusion was stopped today.  Started on oral torsemide. Net fluid balance is -12.4 L to date with 2500 cc output yesterday. Continue Coreg and spironolactone.  Hold  Entresto. Diuresis changes per cardiology and nephrology.  Currently on torsemide 40 mg PO daily.  AKI on CKD stage IIIb -AKI present on admission, likely secondary to CHF and cardiorenal syndrome.  Improving with  diuresis.  Nephrology is following.  Renal ultrasound was negative for obstruction.   Hypokalemia - K 3.0 this morning, replacing orally.  BMP to monitor, replace as needed.  Hypotension - due to ongoing diuresis and beta blocker.  Added low dose midodrine.  Hx of Hypertension -recently hypotensive.  Continue Coreg but hold for hypotension or bradycardia.  Hold Entresto given AKI.  Anemia of chronic disease / iron deficiency - stable, no evidence of bleeding.  Monitor CBC.  Venofer 200 mg IV today and tomorrow infusion today and tomorrow (lower dose to limit fluids in).  Prolonged QT interval - POA with QTc 507 on EKG.  Avoid QT-prolonging agents as much as possible.  Stopped tramadol.  Monitor closely.  Maintain K>4.0 and Mg>2.0.  Started daily MagOx.  Nonsustained V-tach - has history during previous admissions as well.  Monitor on telemetry.  Maintain K>4.0 and Mg>2.0.  Bilateral foot pain - unclear if due to tense edema in her feet, or worsened neuropathy.  Already on gabapentin, will not increase given renal function.  PRN Norco or Tylenol.  Elevate legs.  Right foot edema and pain are better today (7/31), but left still tense and painful to palpation.  Acute metabolic encephalopathy - with confusion, intermittent agitation.  Suspect underlying psychiatric disorder.  Psych consult pending as of 7/30.  History of ventricular thrombus -continue Xarelto.  Social - Homeless.  Severe medical issues.  APS is on board and pursuing guardianship and placement.  APS requires psychiatric evaluation, consult order has been placed.  We will have to be seen by rounding psych provider over the weekend. Last admission, was discharged to SNF/rehab, but signed herself out.  SNF placement  unlikely given ongoing drug abuse.  Patient would have to agree to substance abuse rehab.    Depression/anxiety- chronic, stable.  Evaluated by psychiatry per APS' request and given behavioral disturbances.  Does not meet criteria for inpatient psych admission.  Continue Prozac and as needed Xanax.  Obesity: Body mass index is 37.19 kg/m.  Complicates overall care and prognosis.  DVT prophylaxis:  rivaroxaban (XARELTO) tablet 20 mg   Diet:  Diet Orders (From admission, onward)    Start     Ordered   07/27/19 0209  Diet 2 gram sodium Room service appropriate? Yes; Fluid consistency: Thin  Diet effective now       Question Answer Comment  Room service appropriate? Yes   Fluid consistency: Thin      07/27/19 0228            Code Status: Full Code    Subjective 08/09/19    Patient seen this morning at bedside.  No acute events reported overnight.  She was sleeping but awakes to voice.  States " hanging in there".  She continues to drift back to sleep during encounter and questioning regarding other symptoms.  Denies any acute complaints including fevers, chills, chest pain or shortness of breath.  Says her feet still hurt.  Disposition Plan & Communication   Status is: Inpatient  Remains inpatient appropriate because:IV treatments appropriate due to intensity of illness or inability to take PO.  Patient continues to be volume overloaded at this time.   APS who are pursuing guardianship and placement.   Dispo:  Patient From: Home  Planned Disposition: To be determined, APS seeking placement  Expected discharge date: > 3 days  Medically stable for discharge: No    Family Communication: None at bedside, will attempt to call   Consults, Procedures, Significant Events   Consultants:   Nephrology  Cardiology  Psychiatry  Procedures:   None  Antimicrobials:   None   Objective   Vitals:   08/08/19 1642 08/08/19 1941 08/09/19 0447 08/09/19 0741  BP: 104/73  (!) 115/89 (!) 92/61 (!) 99/57  Pulse: 84 85 80 78  Resp: 18 18 18 16   Temp: 97.6 F (36.4 C) 98.8 F (37.1 C) 99.2 F (37.3 C) 97.9 F (36.6 C)  TempSrc:  Oral Oral   SpO2: 100% 98% 90% 97%  Weight:      Height:        Intake/Output Summary (Last 24 hours) at 08/09/2019 0905 Last data filed at 08/09/2019 0209 Gross per 24 hour  Intake 720 ml  Output 2200 ml  Net -1480 ml   Filed Weights   08/06/19 0352 08/07/19 0700 08/08/19 0333  Weight: (!) 108.7 kg (!) 109 kg (!) 107.7 kg    Physical Exam:  General exam: awake & alert, no acute distress Respiratory system: lung sounds clear diminished bases, no wheezes or rhonchi, normal respiratory effort. Cardiovascular system: normal S1/S2, RRR, 1+ pitting edema BLE's, feet have tense edema and painful to touch.   Central nervous system: A&O x3. no gross focal neurologic deficits Psychiatry: normal mood, congruent affect  Labs   Data Reviewed: I have personally reviewed following labs and imaging studies  CBC: Recent Labs  Lab 08/02/19 0932  08/02/19 0932 08/03/19 0523 08/04/19 0519 08/05/19 0412 08/06/19 0552 08/09/19 0612  WBC 6.4   < > 7.7 8.1 9.6 10.3 9.9  NEUTROABS 4.3  --  5.2 5.5 6.6  --   --   HGB 8.9*   < > 9.2* 8.4* 7.8* 8.5* 7.6*  HCT 30.2*   < > 31.6* 27.0* 26.6* 27.0* 24.1*  MCV 71.2*   < > 71.7* 68.4* 70.6* 69.1* 66.6*  PLT 313   < > 320 314 339 352 428*   < > = values in this interval not displayed.   Basic Metabolic Panel: Recent Labs  Lab 08/02/19 0932 08/03/19 0523 08/05/19 0412 08/06/19 0552 08/07/19 0613 08/08/19 1742 08/09/19 0612  NA 137   < > 134* 135 134* 134* 133*  K 3.7   < > 3.3* 4.1 4.1 3.4* 3.0*  CL 100   < > 96* 95* 96* 91* 93*  CO2 28   < > 30 30 28 31 30   GLUCOSE 106*   < > 157* 152* 121* 165* 112*  BUN 50*   < > 44* 45* 50* 53* 54*  CREATININE 2.16*   < > 1.86* 1.92* 1.97* 1.95* 1.85*  CALCIUM 8.3*   < > 8.2* 8.4* 8.5* 8.8* 8.7*  MG 1.7  --  1.6* 2.0  --  2.2 1.9  PHOS 2.7   --   --   --   --   --   --    < > = values in this interval not displayed.   GFR: Estimated Creatinine Clearance: 39.3 mL/min (A) (by C-G formula based on SCr of 1.85 mg/dL (H)). Liver Function Tests: No results for input(s): AST, ALT, ALKPHOS, BILITOT, PROT, ALBUMIN in the last 168 hours. No results for input(s): LIPASE, AMYLASE in the last 168 hours. No results for input(s): AMMONIA in the last 168 hours. Coagulation Profile: No results for input(s): INR, PROTIME in the last 168 hours. Cardiac Enzymes: No results for input(s): CKTOTAL, CKMB, CKMBINDEX, TROPONINI in the last 168 hours. BNP (last 3 results) No results for input(s): PROBNP in the last 8760 hours. HbA1C: No results for input(s): HGBA1C in the last 72 hours. CBG: No results for input(s): GLUCAP in the last 168 hours. Lipid Profile: No results for input(s): CHOL, HDL, LDLCALC, TRIG, CHOLHDL, LDLDIRECT in the last 72 hours. Thyroid Function Tests: No results for input(s): TSH, T4TOTAL, FREET4, T3FREE, THYROIDAB in the last 72 hours. Anemia Panel: Recent Labs    08/08/19 1742 08/08/19 1821  VITAMINB12  --  542  FOLATE 8.8  --   FERRITIN 113  --   TIBC 230*  --   IRON 14*  --   RETICCTPCT 1.3  --    Sepsis Labs: No results for input(s): PROCALCITON, LATICACIDVEN in the last 168 hours.  No results found for this or any previous visit (from the past 240 hour(s)).    Imaging Studies   No results found.   Medications   Scheduled Meds: . aspirin EC  81 mg Oral Daily  . atorvastatin  20 mg Oral Daily  . carvedilol  3.125 mg Oral BID WC  . FLUoxetine  20 mg Oral Daily  . gabapentin  300 mg Oral TID  . metolazone  5 mg Oral Once per day on Mon Wed Fri  . midodrine  2.5 mg Oral TID WC  . multivitamin with minerals  1 tablet Oral Daily  . Ensure Max Protein  11 oz Oral BID  . rivaroxaban  20 mg Oral Q supper  . sodium chloride flush  3 mL Intravenous Once  . sodium chloride flush  3 mL Intravenous Q12H  .  torsemide  40 mg Oral Daily   Continuous Infusions: . sodium chloride 4 mL/hr at 08/06/19 0556  . iron sucrose         LOS: 13 days    Time spent: 20 minutes    Ezekiel Slocumb, DO [facility]  08/09/2019, 9:05 AM    If 7PM-7AM, please contact night-coverage. How to contact the [facility] Attending or Consulting provider [place] or covering provider during after hours [place], for this patient?    1. Check the care team in [facility] and look for a) attending/consulting [facility] provider listed and b) the [facility] team listed 2. Log into [email or web address] and use [place]'s universal password to access. If you do not have the password, please contact the hospital operator. 3. Locate the [facility] provider you are looking for under [facility] and page to a number that you can be directly reached. 4. If you still have difficulty reaching the provider, please page the [facility] (Director on Call) for the Hospitalists listed on amion for assistance.

## 2019-08-09 NOTE — Progress Notes (Addendum)
Patient is more aware with appropriate behavior today. Reports she is very sleepy and asleep each time I am in the room. DO Arbutus Ped ordered Iron q24h for her. I will monitor her energy levels today. Patient not able to stay awake long enough to eat breakfast. I will assist her at lunch to see if she can stay awake to eat more.

## 2019-08-09 NOTE — Progress Notes (Signed)
Katelyn Lane  MRN: [id number]  DOB/AGE: 64-23-1957 64 y.o.  Primary Care Physician:Revelo, Elyse Jarvis, MD  Admit date: 07/26/2019  Chief Complaint:  Chief Complaint  Patient presents with  . Loss of Consciousness  . Leg Swelling    S-Pt presented on  07/26/2019 with  Chief Complaint  Patient presents with  . Loss of Consciousness  . Leg Swelling  . Patient offers no specific complaint.  Patient is lying comfortably in the bed  Medications . aspirin EC  81 mg Oral Daily  . atorvastatin  20 mg Oral Daily  . carvedilol  3.125 mg Oral BID WC  . FLUoxetine  20 mg Oral Daily  . gabapentin  300 mg Oral TID  . metolazone  5 mg Oral Once per day on Mon Wed Fri  . midodrine  2.5 mg Oral TID WC  . multivitamin with minerals  1 tablet Oral Daily  . Ensure Max Protein  11 oz Oral BID  . rivaroxaban  20 mg Oral Q supper  . sodium chloride flush  3 mL Intravenous Once  . sodium chloride flush  3 mL Intravenous Q12H  . torsemide  40 mg Oral Daily         BTD:HRCBU from the symptoms mentioned above,there are no other symptoms referable to all systems reviewed.  Physical Exam: Vital signs in last 24 hours: Temp:  [97.6 F (36.4 C)-99.2 F (37.3 C)] 97.9 F (36.6 C) (08/01 0741) Pulse Rate:  [68-85] 78 (08/01 0741) Resp:  [16-18] 16 (08/01 0741) BP: (90-115)/(57-89) 99/57 (08/01 0741) SpO2:  [90 %-100 %] 97 % (08/01 0741) Weight change:  Last BM Date: 08/05/19  Intake/Output from previous day: 07/31 0701 - 08/01 0700 In: 720 [P.O.:720] Out: 2500 [Urine:2500] Total I/O In: 240 [P.O.:240] Out: -    Physical Exam: General- pt is awake,alert, oriented to time place and person Resp- No acute REsp distress, CTA B/L NO Rhonchi CVS- S1S2 regular in rate and rhythm GIT- BS+, soft, NT, ND EXT- NO LE Edema, Cyanosis, chronic venous stasis changes present   Lab Results: CBC Recent Labs    08/09/19 0612  WBC 9.9  HGB 7.6*  HCT 24.1*  PLT 428*     BMET Recent Labs    08/08/19 1742 08/09/19 0612  NA 134* 133*  K 3.4* 3.0*  CL 91* 93*  CO2 31 30  GLUCOSE 165* 112*  BUN 53* 54*  CREATININE 1.95* 1.85*  CALCIUM 8.8* 8.7*   Creatinine trend  2021 2.0-->2.9==>2.0==>1.8 Patient has had admissions nearly every month this year January admission/February admission/March admission/April admission/May admission1.7--2.4 2020 1.7--2.0 2014 1.54   MICRO No results found for this or any previous visit (from the past 240 hour(s)).    Lab Results  Component Value Date   CALCIUM 8.7 (L) 08/09/2019   PHOS 2.7 08/02/2019               Impression: Patient is a 64 year old African-American female with a past medical history of hypertension, hyperlipidemia, depression, polysubstance abuse, recent Covid infection who was admitted to the hospital on July 18 with chief complaint of acute CHF, AKI on CKD   1)Renal  AKI secondary to ATN/cardiorenal syndrome Patient has AKI on CKD Patient has CKD since 2014 Patient has CKD stage IIIb/IV Patient has CKD most likely secondary to hypertension/cocaine abuse Patient CKD progression has been marked with multiple episodes of AKI   2) hypotension Patient is on midodrine  3)Anemia of chronic disease.  Patient has a  component of iron deficiency anemia  HGb is not at goal (9--11)   Results for WENDA, VANSCHAICK (MRN [id number]) as of 08/09/2019 10:55  Ref. Range 08/08/2019 17:42  Iron Latest Ref Range: 28 - 170 ug/dL 14 (L)  UIBC Latest Units: ug/dL 216  TIBC Latest Ref Range: 250 - 450 ug/dL 230 (L)  Saturation Ratios Latest Ref Range: 10.4 - 31.8 % 6 (L)  Ferritin Latest Ref Range: 11 - 307 ng/mL 113  Folate Latest Ref Range: >5.9 ng/mL 8.8    We will give IV iron   4) vitamin D deficiency Patient has history of vitamin D deficiency in the past-level was 24 in February 2020   5)Proteinuria Patient has nonnephrotic range proteinuria most likely secondary to hypertension  superimposed with cocaine abuse  6) electrolytes   sodium Hyponatremia   potassium Hypokalemic Secondary to diuretics We will replete   7)Acid base Co2 at goal   8) acute on chronic CHF Patient is now clinically better Patient is currently being diuresed-Patient transitioned from IV diuretics to p.o. diuretics  Patient is nearly 16 L negative since admission We will DC metolazone   Plan:  We will give IV iron We will d/c metolazone We will repeat potassium We will follow Chem-7   Eleana Tocco s [facility] 08/09/2019, 10:57 AM

## 2019-08-10 LAB — CBC
HCT: 25.7 % — ABNORMAL LOW (ref 36.0–46.0)
Hemoglobin: 7.6 g/dL — ABNORMAL LOW (ref 12.0–15.0)
MCH: 20.7 pg — ABNORMAL LOW (ref 26.0–34.0)
MCHC: 29.6 g/dL — ABNORMAL LOW (ref 30.0–36.0)
MCV: 69.8 fL — ABNORMAL LOW (ref 80.0–100.0)
Platelets: 490 10*3/uL — ABNORMAL HIGH (ref 150–400)
RBC: 3.68 MIL/uL — ABNORMAL LOW (ref 3.87–5.11)
RDW: 19.8 % — ABNORMAL HIGH (ref 11.5–15.5)
WBC: 8.7 10*3/uL (ref 4.0–10.5)
nRBC: 0 % (ref 0.0–0.2)

## 2019-08-10 LAB — BASIC METABOLIC PANEL
Anion gap: 9 (ref 5–15)
BUN: 52 mg/dL — ABNORMAL HIGH (ref 8–23)
CO2: 32 mmol/L (ref 22–32)
Calcium: 8.6 mg/dL — ABNORMAL LOW (ref 8.9–10.3)
Chloride: 92 mmol/L — ABNORMAL LOW (ref 98–111)
Creatinine, Ser: 1.82 mg/dL — ABNORMAL HIGH (ref 0.44–1.00)
GFR calc Af Amer: 34 mL/min — ABNORMAL LOW (ref >60)
GFR calc non Af Amer: 29 mL/min — ABNORMAL LOW (ref >60)
Glucose, Bld: 148 mg/dL — ABNORMAL HIGH (ref 70–99)
Potassium: 3.5 mmol/L (ref 3.5–5.1)
Sodium: 133 mmol/L — ABNORMAL LOW (ref 135–145)

## 2019-08-10 LAB — BRAIN NATRIURETIC PEPTIDE: B Natriuretic Peptide: 1746.5 pg/mL — ABNORMAL HIGH (ref 0.0–100.0)

## 2019-08-10 LAB — MAGNESIUM: Magnesium: 2.2 mg/dL (ref 1.7–2.4)

## 2019-08-10 LAB — GLUCOSE, CAPILLARY: Glucose-Capillary: 131 mg/dL — ABNORMAL HIGH (ref 70–99)

## 2019-08-10 MED ORDER — POTASSIUM CHLORIDE CRYS ER 20 MEQ PO TBCR
40.0000 meq | EXTENDED_RELEASE_TABLET | Freq: Once | ORAL | Status: AC
  Administered 2019-08-10: 40 meq via ORAL
  Filled 2019-08-10: qty 2

## 2019-08-10 MED ORDER — DM-GUAIFENESIN ER 30-600 MG PO TB12
1.0000 | ORAL_TABLET | Freq: Two times a day (BID) | ORAL | Status: DC
  Administered 2019-08-10 – 2019-09-02 (×43): 1 via ORAL
  Filled 2019-08-10 (×46): qty 1

## 2019-08-10 NOTE — Progress Notes (Signed)
Mobility Specialist - Progress Note   08/10/19 1609  Mobility  Activity Refused mobility  Mobility performed by Mobility specialist    Pt refused mobility. Will attempt at another time.    Kathee Delton Mobility Specialist 08/10/19, 4:11 PM

## 2019-08-10 NOTE — Progress Notes (Signed)
PROGRESS NOTE    Katelyn Lane   QMG:[id number]  DOB: 1955-07-14  PCP: [name], MD    DOA: 07/26/2019 LOS: 73   Brief Narrative   64 year old female with known history of chronic systolic heart failure, recent COVID-19 infection, hyperlipidemia and hypertension, stage IIIb chronic kidney disease presented to the emergency room with acute onset of worsening dyspnea associated with dry cough wheezing, orthopnea and PND as well lower extremity edema for 1 week.  Patient had previous admissions with noncompliance.  She smokes cocaine and she is unfortunately homeless. At the emergency room vitals were stable.  BUN 52/creatinine 2.39.  BNP 4500.  Chest x-ray showed cardiomegaly and interstitial pulmonary edema.  Patient was given IV Lasix and admission requested for CHF exacerbation.     Assessment & Plan   Active Problems:   Hyperlipidemia   Depression   CKD (chronic kidney disease), stage III   Acute metabolic encephalopathy   History of substance abuse (HCC)   Chronic obstructive pulmonary disease (HCC)   Pulmonary hypertension (HCC)   Acute on chronic combined systolic (congestive) and diastolic (congestive) heart failure (HCC)   History of anemia due to chronic kidney disease   Prolonged QT interval   Acute CHF (congestive heart failure) (HCC)   Chest pain - Resolved. Reported on 7/31, waxing/waning over past few days, but not previously mentioned.  Seems unlikely ACS.  Troponin 210 >>199.  12 lead was ordered.  Monitor closely.    Acute on chronic combined congestive heart failure -  Continues to be volume overloaded. Echocardiogram on 06/22/2019 EF 32%, grade 3 diastolic dysfunction, moderate pulmonary hypertension, severely enlarged right ventricle.  Cardiology and nephrology are following.   Lasix infusion was stopped today.  Started on oral torsemide. Net fluid balance is -15 L to date with over 3500cc output yesterday. Continue Coreg and spironolactone.   Hold Entresto. Diuresis changes per cardiology and nephrology.  Currently on torsemide 40 mg PO daily.  AKI on CKD stage IIIb -AKI present on admission, likely secondary to CHF and cardiorenal syndrome.  Improving with  diuresis.  Nephrology is following.  Renal ultrasound was negative for obstruction.   Hypokalemia - K 3.0 this morning, replacing orally.  BMP to monitor, replace as needed.  Hypotension - due to ongoing diuresis and beta blocker.  Added low dose midodrine.  Hx of Hypertension -recently hypotensive.  Continue Coreg but hold for hypotension or bradycardia.  Hold Entresto given AKI.  Anemia of chronic disease / iron deficiency - stable, no evidence of bleeding.  Monitor CBC.  Venofer 200 mg IV today and tomorrow infusion today and tomorrow (lower dose to limit fluids in).  Prolonged QT interval - POA with QTc 507 on EKG.  Avoid QT-prolonging agents as much as possible.  Stopped tramadol.  Monitor closely.  Maintain K>4.0 and Mg>2.0.  Started daily MagOx.  Nonsustained V-tach - has history during previous admissions as well.  Monitor on telemetry.  Maintain K>4.0 and Mg>2.0.  Bilateral foot pain - unclear if due to tense edema in her feet, or worsened neuropathy.  Already on gabapentin, will not increase given renal function.  PRN Norco or Tylenol.  Elevate legs.  Right foot edema and pain are better today (7/31), but left still tense and painful to palpation.  Acute metabolic encephalopathy - with confusion, intermittent agitation.  Suspect underlying psychiatric disorder.  Psych consult pending as of 7/30.  History of ventricular thrombus -continue Xarelto.  Social - Homeless.  Severe medical issues.  APS is on board and pursuing guardianship and placement.  APS requires psychiatric evaluation, consult order has been placed.  We will have to be seen by rounding psych provider over the weekend. Last admission, was discharged to SNF/rehab, but signed herself out.  SNF  placement unlikely given ongoing drug abuse.  Patient would have to agree to substance abuse rehab.    Depression/anxiety- chronic, stable.  Evaluated by psychiatry per APS' request and given behavioral disturbances.  Does not meet criteria for inpatient psych admission.  Continue Prozac and as needed Xanax.  Obesity: Body mass index is 34.61 kg/m.  Complicates overall care and prognosis.  DVT prophylaxis:  rivaroxaban (XARELTO) tablet 20 mg   Diet:  Diet Orders (From admission, onward)    Start     Ordered   07/27/19 0209  Diet 2 gram sodium Room service appropriate? Yes; Fluid consistency: Thin  Diet effective now       Question Answer Comment  Room service appropriate? Yes   Fluid consistency: Thin      07/27/19 0228            Code Status: Full Code    Subjective 08/10/19    Patient seen this morning at bedside.  No acute events reported overnight.  She was awake and eating breakfast, much more alert today.  She says she was so sleepy yesterday from pills she was given.  She denies any acute complaints including chest pain or shortness of breath, fevers or chills, nausea or vomiting.  Says leg swelling is better.  Disposition Plan & Communication   Status is: Inpatient  Remains inpatient appropriate because:IV treatments appropriate due to intensity of illness or inability to take PO.  Patient continues to be volume overloaded at this time, undergoing diuresis, continues to require close monitoring of renal function and electrolytes.   APS who are pursuing guardianship and placement.   Dispo:  Patient From: Home  Planned Disposition: To be determined, APS seeking placement  Expected discharge date: > 3 days  Medically stable for discharge: No    Family Communication: None at bedside, will attempt to call   Consults, Procedures, Significant Events   Consultants:   Nephrology  Cardiology  Psychiatry  Procedures:   None  Antimicrobials:    None   Objective   Vitals:   08/09/19 2001 08/10/19 0440 08/10/19 0441 08/10/19 0731  BP: 98/69  (!) 89/65 103/67  Pulse:   67 74  Resp: 20  20 17   Temp: 97.9 F (36.6 C)   98.2 F (36.8 C)  TempSrc: Oral     SpO2: 98%  97% 96%  Weight:  (!) 100.2 kg    Height:        Intake/Output Summary (Last 24 hours) at 08/10/2019 0905 Last data filed at 08/10/2019 0600 Gross per 24 hour  Intake 1801.37 ml  Output 3675 ml  Net -1873.63 ml   Filed Weights   08/07/19 0700 08/08/19 0333 08/10/19 0440  Weight: (!) 109 kg (!) 107.7 kg (!) 100.2 kg    Physical Exam:  General exam: awake & alert, no acute distress Respiratory system: CTAB with diminished bases, no wheezes or rhonchi, normal respiratory effort. Cardiovascular system: normal S1/S2, RRR, trace to 1+ pitting edema BLE's, feet have tense edema slightly improved Central nervous system: A&O x3. no gross focal neurologic deficits Psychiatry: normal mood, congruent affect  Labs   Data Reviewed: I have personally reviewed following labs and imaging studies  CBC: Recent Labs  Lab 08/04/19 0519 08/05/19 0412 08/06/19 0552 08/09/19 0612 08/10/19 0553  WBC 8.1 9.6 10.3 9.9 8.7  NEUTROABS 5.5 6.6  --   --   --   HGB 8.4* 7.8* 8.5* 7.6* 7.6*  HCT 27.0* 26.6* 27.0* 24.1* 25.7*  MCV 68.4* 70.6* 69.1* 66.6* 69.8*  PLT 314 339 352 428* 671*   Basic Metabolic Panel: Recent Labs  Lab 08/05/19 0412 08/05/19 0412 08/06/19 0552 08/07/19 0613 08/08/19 1742 08/09/19 0612 08/10/19 0553  NA 134*   < > 135 134* 134* 133* 133*  K 3.3*   < > 4.1 4.1 3.4* 3.0* 3.5  CL 96*   < > 95* 96* 91* 93* 92*  CO2 30   < > 30 28 31 30  32  GLUCOSE 157*   < > 152* 121* 165* 112* 148*  BUN 44*   < > 45* 50* 53* 54* 52*  CREATININE 1.86*   < > 1.92* 1.97* 1.95* 1.85* 1.82*  CALCIUM 8.2*   < > 8.4* 8.5* 8.8* 8.7* 8.6*  MG 1.6*  --  2.0  --  2.2 1.9 2.2   < > = values in this interval not displayed.   GFR: Estimated Creatinine Clearance:  38.5 mL/min (A) (by C-G formula based on SCr of 1.82 mg/dL (H)). Liver Function Tests: No results for input(s): AST, ALT, ALKPHOS, BILITOT, PROT, ALBUMIN in the last 168 hours. No results for input(s): LIPASE, AMYLASE in the last 168 hours. No results for input(s): AMMONIA in the last 168 hours. Coagulation Profile: No results for input(s): INR, PROTIME in the last 168 hours. Cardiac Enzymes: No results for input(s): CKTOTAL, CKMB, CKMBINDEX, TROPONINI in the last 168 hours. BNP (last 3 results) No results for input(s): PROBNP in the last 8760 hours. HbA1C: No results for input(s): HGBA1C in the last 72 hours. CBG: No results for input(s): GLUCAP in the last 168 hours. Lipid Profile: No results for input(s): CHOL, HDL, LDLCALC, TRIG, CHOLHDL, LDLDIRECT in the last 72 hours. Thyroid Function Tests: No results for input(s): TSH, T4TOTAL, FREET4, T3FREE, THYROIDAB in the last 72 hours. Anemia Panel: Recent Labs    08/08/19 1742 08/08/19 1821  VITAMINB12  --  542  FOLATE 8.8  --   FERRITIN 113  --   TIBC 230*  --   IRON 14*  --   RETICCTPCT 1.3  --    Sepsis Labs: No results for input(s): PROCALCITON, LATICACIDVEN in the last 168 hours.  No results found for this or any previous visit (from the past 240 hour(s)).    Imaging Studies   No results found.   Medications   Scheduled Meds: . aspirin EC  81 mg Oral Daily  . atorvastatin  20 mg Oral Daily  . carvedilol  3.125 mg Oral BID WC  . FLUoxetine  20 mg Oral Daily  . gabapentin  300 mg Oral TID  . magnesium oxide  400 mg Oral Daily  . midodrine  2.5 mg Oral TID WC  . multivitamin with minerals  1 tablet Oral Daily  . Ensure Max Protein  11 oz Oral BID  . rivaroxaban  20 mg Oral Q supper  . sodium chloride flush  3 mL Intravenous Once  . sodium chloride flush  3 mL Intravenous Q12H  . torsemide  40 mg Oral Daily   Continuous Infusions: . sodium chloride Stopped (08/09/19 1233)  . iron sucrose Stopped (08/09/19  1128)       LOS: 14 days    Time  spent: 20 minutes    Ezekiel Slocumb, DO [facility]  08/10/2019, 9:05 AM    If 7PM-7AM, please contact night-coverage. How to contact the [facility] Attending or Consulting provider [place] or covering provider during after hours [place], for this patient?    1. Check the care team in [facility] and look for a) attending/consulting [facility] provider listed and b) the [facility] team listed 2. Log into [email or web address] and use [facility]'s universal password to access. If you do not have the password, please contact the hospital operator. 3. Locate the [facility] provider you are looking for under [facility] and page to a number that you can be directly reached. 4. If you still have difficulty reaching the provider, please page the [facility] (Director on Call) for the Hospitalists listed on amion for assistance.

## 2019-08-10 NOTE — Progress Notes (Signed)
   08/10/19 1100  Clinical Encounter Type  Visited With Patient  Visit Type Follow-up  Referral From Chaplain  Consult/Referral To Chaplain  Chaplain stopped in to see patient, but she was sleeping. Chaplain will follow up later.

## 2019-08-10 NOTE — Progress Notes (Signed)
[facility]  ROUNDING NOTE   Subjective:   08/01 0701 - 08/02 0700 In: 1801.4 [P.O.:1680; I.V.:11.4; IV Piggyback:110] Out: 3675 [Urine:3675] Patient resting comfortably in bed. Overall feeling better as compared to admission.   Objective:  Vital signs in last 24 hours:  Temp:  [97.9 F (36.6 C)-98.5 F (36.9 C)] 98.5 F (36.9 C) (08/02 1135) Pulse Rate:  [67-74] 73 (08/02 1135) Resp:  [17-20] 17 (08/02 1135) BP: (89-110)/(65-76) 110/75 (08/02 1135) SpO2:  [92 %-98 %] 95 % (08/02 1135) Weight:  [100.2 kg] 100.2 kg (08/02 0440)  Weight change:  Filed Weights   08/07/19 0700 08/08/19 0333 08/10/19 0440  Weight: (!) 109 kg (!) 107.7 kg (!) 100.2 kg    Intake/Output: I/O last 3 completed shifts: In: 1801.4 [P.O.:1680; I.V.:11.4; IV Piggyback:110] Out: 4275 [Urine:4275]   Intake/Output this shift:  Total I/O In: -  Out: 750 [Urine:750]  Physical Exam: General: NAD   Head: Normocephalic, atraumatic. Moist oral mucosal membranes  Eyes: Anicteric  Lungs:  Clear bilateral  Heart: Regular rate and rhythm  Abdomen:  Soft, nontender, BS present  Extremities: 2+ peripheral edema.  Neurologic: Nonfocal, moving all four extremities  Skin: No lesions        Basic Metabolic Panel: Recent Labs  Lab 08/05/19 0412 08/05/19 0412 08/06/19 0552 08/06/19 0552 08/07/19 7829 08/07/19 0613 08/08/19 1742 08/09/19 0612 08/10/19 0553  NA 134*   < > 135  --  134*  --  134* 133* 133*  K 3.3*   < > 4.1  --  4.1  --  3.4* 3.0* 3.5  CL 96*   < > 95*  --  96*  --  91* 93* 92*  CO2 30   < > 30  --  28  --  31 30 32  GLUCOSE 157*   < > 152*  --  121*  --  165* 112* 148*  BUN 44*   < > 45*  --  50*  --  53* 54* 52*  CREATININE 1.86*   < > 1.92*  --  1.97*  --  1.95* 1.85* 1.82*  CALCIUM 8.2*   < > 8.4*   < > 8.5*   < > 8.8* 8.7* 8.6*  MG 1.6*  --  2.0  --   --   --  2.2 1.9 2.2   < > = values in this interval not displayed.    Liver Function Tests: No results for  input(s): AST, ALT, ALKPHOS, BILITOT, PROT, ALBUMIN in the last 168 hours. No results for input(s): LIPASE, AMYLASE in the last 168 hours. No results for input(s): AMMONIA in the last 168 hours.  CBC: Recent Labs  Lab 08/04/19 0519 08/05/19 0412 08/06/19 0552 08/09/19 0612 08/10/19 0553  WBC 8.1 9.6 10.3 9.9 8.7  NEUTROABS 5.5 6.6  --   --   --   HGB 8.4* 7.8* 8.5* 7.6* 7.6*  HCT 27.0* 26.6* 27.0* 24.1* 25.7*  MCV 68.4* 70.6* 69.1* 66.6* 69.8*  PLT 314 339 352 428* 490*    Cardiac Enzymes: No results for input(s): CKTOTAL, CKMB, CKMBINDEX, TROPONINI in the last 168 hours.  BNP: Invalid input(s): POCBNP  CBG: No results for input(s): GLUCAP in the last 168 hours.  Microbiology: Results for orders placed or performed during the hospital encounter of 06/19/19  SARS Coronavirus 2 by RT PCR (hospital order, performed in [facility] hospital lab) Nasopharyngeal Nasopharyngeal Swab     Status: None   Collection Time: 06/19/19  9:28 AM   Specimen: Nasopharyngeal Swab  Result Value Ref Range Status   SARS Coronavirus 2 NEGATIVE NEGATIVE Final    Comment: (NOTE) SARS-CoV-2 target nucleic acids are NOT DETECTED.  The SARS-CoV-2 RNA is generally detectable in upper and lower respiratory specimens during the acute phase of infection. The lowest concentration of SARS-CoV-2 viral copies this assay can detect is 250 copies / mL. A negative result does not preclude SARS-CoV-2 infection and should not be used as the sole basis for treatment or other patient management decisions.  A negative result may occur with improper specimen collection / handling, submission of specimen other than nasopharyngeal swab, presence of viral mutation(s) within the areas targeted by this assay, and inadequate number of viral copies (<250 copies / mL). A negative result must be combined with clinical observations, patient history, and epidemiological information.  Fact Sheet for Patients:    StrictlyIdeas.no  Fact Sheet for Healthcare Providers: BankingDealers.co.za  This test is not yet approved or  cleared by the [place] FDA and has been authorized for detection and/or diagnosis of SARS-CoV-2 by FDA under an Emergency Use Authorization (EUA).  This EUA will remain in effect (meaning this test can be used) for the duration of the COVID-19 declaration under Section 564(b)(1) of the Act, 21 U.S.C. section 360bbb-3(b)(1), unless the authorization is terminated or revoked sooner.  Performed at [facility], [address] [place], [place] [address]     Coagulation Studies: No results for input(s): LABPROT, INR in the last 72 hours.  Urinalysis: No results for input(s): COLORURINE, LABSPEC, PHURINE, GLUCOSEU, HGBUR, BILIRUBINUR, KETONESUR, PROTEINUR, UROBILINOGEN, NITRITE, LEUKOCYTESUR in the last 72 hours.  Invalid input(s): APPERANCEUR    Imaging: No results found.   Medications:   . sodium chloride Stopped (08/09/19 1233)   . aspirin EC  81 mg Oral Daily  . atorvastatin  20 mg Oral Daily  . carvedilol  3.125 mg Oral BID WC  . FLUoxetine  20 mg Oral Daily  . gabapentin  300 mg Oral TID  . magnesium oxide  400 mg Oral Daily  . midodrine  2.5 mg Oral TID WC  . multivitamin with minerals  1 tablet Oral Daily  . Ensure Max Protein  11 oz Oral BID  . rivaroxaban  20 mg Oral Q supper  . sodium chloride flush  3 mL Intravenous Once  . sodium chloride flush  3 mL Intravenous Q12H  . torsemide  40 mg Oral Daily   sodium chloride, acetaminophen, ALPRAZolam, HYDROcodone-acetaminophen, LORazepam, sodium chloride flush  Assessment/ Plan:  Katelyn Lane is a 64 y.o. black female with hypertension, hyperlipidemia, depression , polysubstance abuse, recent COVID-19 infection who was admitted to [facility] on 07/26/2019 for Acute CHF (congestive heart failure) ([place]) [I50.9] Acute on chronic congestive heart  failure, unspecified heart failure type ([place]) [I50.9]  1. Acute renal failure on chronic kidney disease stage IIIB with proteinuria: baseline creatinine 1.7, GFR of 32 on 03/08/19. Suspect patient may have progression of chronic kidney disease from multiple episodes of acute kidney injury with limited recovery.  Chronic kidney disease has been thought to be secondary to hypertension in the past.  Acute renal failure secondary to acute cardiorenal syndrome.  No IV contrast exposure. Renal ultrasound without obstruction.  Shows diffuse increased echogenicity of the bilateral kidneys.  -Recent creatinines ranging between 1.8-1.9.  Continue to monitor renal parameters closely. 08/01 0701 - 08/02 0700 In: 1801.4 [P.O.:1680; I.V.:11.4; IV Piggyback:110] Out: 3675 [Urine:3675] Lab Results  Component  Value Date   CREATININE 1.82 (H) 08/10/2019   CREATININE 1.85 (H) 08/09/2019   CREATININE 1.95 (H) 08/08/2019      2. Acute exacerbation of chronic systolic and diastolic congestive heart failure and generalized edema: echocardiogram on 06/22/19.  EF of 25 to 86%, grade 3 diastolic dysfunction.  Moderately elevated pulmonary artery systolic pressure, right ventricle severely enlarged.  Estimated RVSP 60 mm suggesting pulmonary hypertension -Continue torsemide p.o. daily for now.  08/01 0701 - 08/02 0700 In: 1801.4 [P.O.:1680; I.V.:11.4; IV Piggyback:110] Out: 3817 [Urine:3675]    LOS: 14 Kately[address] 8/2/20212:11 PM

## 2019-08-11 ENCOUNTER — Ambulatory Visit: Payer: Medicaid Other | Admitting: Family

## 2019-08-11 LAB — BASIC METABOLIC PANEL
Anion gap: 9 (ref 5–15)
BUN: 52 mg/dL — ABNORMAL HIGH (ref 8–23)
CO2: 35 mmol/L — ABNORMAL HIGH (ref 22–32)
Calcium: 9.1 mg/dL (ref 8.9–10.3)
Chloride: 91 mmol/L — ABNORMAL LOW (ref 98–111)
Creatinine, Ser: 1.51 mg/dL — ABNORMAL HIGH (ref 0.44–1.00)
GFR calc Af Amer: 42 mL/min — ABNORMAL LOW (ref >60)
GFR calc non Af Amer: 36 mL/min — ABNORMAL LOW (ref >60)
Glucose, Bld: 141 mg/dL — ABNORMAL HIGH (ref 70–99)
Potassium: 3.9 mmol/L (ref 3.5–5.1)
Sodium: 135 mmol/L (ref 135–145)

## 2019-08-11 LAB — CBC
HCT: 25.7 % — ABNORMAL LOW (ref 36.0–46.0)
Hemoglobin: 7.6 g/dL — ABNORMAL LOW (ref 12.0–15.0)
MCH: 20.7 pg — ABNORMAL LOW (ref 26.0–34.0)
MCHC: 29.6 g/dL — ABNORMAL LOW (ref 30.0–36.0)
MCV: 69.8 fL — ABNORMAL LOW (ref 80.0–100.0)
Platelets: 562 10*3/uL — ABNORMAL HIGH (ref 150–400)
RBC: 3.68 MIL/uL — ABNORMAL LOW (ref 3.87–5.11)
RDW: 20.1 % — ABNORMAL HIGH (ref 11.5–15.5)
WBC: 9.3 10*3/uL (ref 4.0–10.5)
nRBC: 0 % (ref 0.0–0.2)

## 2019-08-11 LAB — MAGNESIUM: Magnesium: 2.1 mg/dL (ref 1.7–2.4)

## 2019-08-11 MED ORDER — FERROUS SULFATE 325 (65 FE) MG PO TABS
325.0000 mg | ORAL_TABLET | Freq: Every day | ORAL | Status: DC
  Administered 2019-08-12 – 2019-09-02 (×22): 325 mg via ORAL
  Filled 2019-08-11 (×22): qty 1

## 2019-08-11 NOTE — Progress Notes (Signed)
[facility]  ROUNDING NOTE   Subjective:   08/02 0701 - 08/03 0700 In: 960 [P.O.:960] Out: 4098 [Urine:4350] Patient resting comfortably in bed. Renal function remains stable. Good diuresis of 4.3 L noted yesterday.   Objective:  Vital signs in last 24 hours:  Temp:  [98 F (36.7 C)-98.8 F (37.1 C)] 98 F (36.7 C) (08/03 1146) Pulse Rate:  [71-82] 77 (08/03 1146) Resp:  [16-18] 16 (08/03 0857) BP: (90-110)/(67-79) 103/74 (08/03 1146) SpO2:  [96 %-100 %] 96 % (08/03 1146) Weight:  [90.1 kg] 90.1 kg (08/03 0354)  Weight change: -10.1 kg Filed Weights   08/08/19 0333 08/10/19 0440 08/11/19 0354  Weight: (!) 107.7 kg (!) 100.2 kg 90.1 kg    Intake/Output: I/O last 3 completed shifts: In: 1680 [P.O.:1680] Out: 6275 [Urine:6275]   Intake/Output this shift:  Total I/O In: 720 [P.O.:720] Out: -   Physical Exam: General: NAD   Head: Normocephalic, atraumatic. Moist oral mucosal membranes  Eyes: Anicteric  Lungs:  Clear bilateral  Heart: Regular rate and rhythm  Abdomen:  Soft, nontender, BS present  Extremities: 2+ peripheral edema.  Neurologic: Nonfocal, moving all four extremities  Skin: No lesions        Basic Metabolic Panel: Recent Labs  Lab 08/06/19 0552 08/06/19 0552 08/07/19 0613 08/07/19 0613 08/08/19 1742 08/08/19 1742 08/09/19 0612 08/10/19 0553 08/11/19 0539  NA 135   < > 134*  --  134*  --  133* 133* 135  K 4.1   < > 4.1  --  3.4*  --  3.0* 3.5 3.9  CL 95*   < > 96*  --  91*  --  93* 92* 91*  CO2 30   < > 28  --  31  --  30 32 35*  GLUCOSE 152*   < > 121*  --  165*  --  112* 148* 141*  BUN 45*   < > 50*  --  53*  --  54* 52* 52*  CREATININE 1.92*   < > 1.97*  --  1.95*  --  1.85* 1.82* 1.51*  CALCIUM 8.4*   < > 8.5*   < > 8.8*   < > 8.7* 8.6* 9.1  MG 2.0  --   --   --  2.2  --  1.9 2.2 2.1   < > = values in this interval not displayed.    Liver Function Tests: No results for input(s): AST, ALT, ALKPHOS, BILITOT, PROT,  ALBUMIN in the last 168 hours. No results for input(s): LIPASE, AMYLASE in the last 168 hours. No results for input(s): AMMONIA in the last 168 hours.  CBC: Recent Labs  Lab 08/05/19 0412 08/06/19 0552 08/09/19 0612 08/10/19 0553 08/11/19 0539  WBC 9.6 10.3 9.9 8.7 9.3  NEUTROABS 6.6  --   --   --   --   HGB 7.8* 8.5* 7.6* 7.6* 7.6*  HCT 26.6* 27.0* 24.1* 25.7* 25.7*  MCV 70.6* 69.1* 66.6* 69.8* 69.8*  PLT 339 352 428* 490* 562*    Cardiac Enzymes: No results for input(s): CKTOTAL, CKMB, CKMBINDEX, TROPONINI in the last 168 hours.  BNP: Invalid input(s): POCBNP  CBG: Recent Labs  Lab 08/10/19 2117  GLUCAP 131*    Microbiology: Results for orders placed or performed during the hospital encounter of 06/19/19  SARS Coronavirus 2 by RT PCR (hospital order, performed in [facility] hospital lab) Nasopharyngeal Nasopharyngeal Swab     Status: None   Collection Time: 06/19/19  9:28 AM   Specimen: Nasopharyngeal Swab  Result Value Ref Range Status   SARS Coronavirus 2 NEGATIVE NEGATIVE Final    Comment: (NOTE) SARS-CoV-2 target nucleic acids are NOT DETECTED.  The SARS-CoV-2 RNA is generally detectable in upper and lower respiratory specimens during the acute phase of infection. The lowest concentration of SARS-CoV-2 viral copies this assay can detect is 250 copies / mL. A negative result does not preclude SARS-CoV-2 infection and should not be used as the sole basis for treatment or other patient management decisions.  A negative result may occur with improper specimen collection / handling, submission of specimen other than nasopharyngeal swab, presence of viral mutation(s) within the areas targeted by this assay, and inadequate number of viral copies (<250 copies / mL). A negative result must be combined with clinical observations, patient history, and epidemiological information.  Fact Sheet for Patients:   StrictlyIdeas.no  Fact Sheet  for Healthcare Providers: BankingDealers.co.za  This test is not yet approved or  cleared by the [place] FDA and has been authorized for detection and/or diagnosis of SARS-CoV-2 by FDA under an Emergency Use Authorization (EUA).  This EUA will remain in effect (meaning this test can be used) for the duration of the COVID-19 declaration under Section 564(b)(1) of the Act, 21 U.S.C. section 360bbb-3(b)(1), unless the authorization is terminated or revoked sooner.  Performed at [facility], [address] [place], [place] [address]     Coagulation Studies: No results for input(s): LABPROT, INR in the last 72 hours.  Urinalysis: No results for input(s): COLORURINE, LABSPEC, PHURINE, GLUCOSEU, HGBUR, BILIRUBINUR, KETONESUR, PROTEINUR, UROBILINOGEN, NITRITE, LEUKOCYTESUR in the last 72 hours.  Invalid input(s): APPERANCEUR    Imaging: No results found.   Medications:   . sodium chloride Stopped (08/09/19 1233)   . aspirin EC  81 mg Oral Daily  . atorvastatin  20 mg Oral Daily  . carvedilol  3.125 mg Oral BID WC  . dextromethorphan-guaiFENesin  1 tablet Oral BID  . FLUoxetine  20 mg Oral Daily  . gabapentin  300 mg Oral TID  . magnesium oxide  400 mg Oral Daily  . midodrine  2.5 mg Oral TID WC  . multivitamin with minerals  1 tablet Oral Daily  . Ensure Max Protein  11 oz Oral BID  . rivaroxaban  20 mg Oral Q supper  . sodium chloride flush  3 mL Intravenous Once  . sodium chloride flush  3 mL Intravenous Q12H  . torsemide  40 mg Oral Daily   sodium chloride, acetaminophen, ALPRAZolam, HYDROcodone-acetaminophen, LORazepam, sodium chloride flush  Assessment/ Plan:  [name] is a 64 y.o. black female with hypertension, hyperlipidemia, depression , polysubstance abuse, recent COVID-19 infection who was admitted to [facility] on 07/26/2019 for Acute CHF (congestive heart failure) ([place]) [I50.9] Acute on chronic congestive heart failure,  unspecified heart failure type ([place]) [I50.9]  1. Acute renal failure on chronic kidney disease stage IIIB with proteinuria: baseline creatinine 1.7, GFR of 32 on 03/08/19. Suspect patient may have progression of chronic kidney disease from multiple episodes of acute kidney injury with limited recovery.  Chronic kidney disease has been thought to be secondary to hypertension in the past.  Acute renal failure secondary to acute cardiorenal syndrome.  No IV contrast exposure. Renal ultrasound without obstruction.  Shows diffuse increased echogenicity of the bilateral kidneys.  -Renal function improved today with a creatinine of 1.5.  Continue to monitor renal function periodically. 08/02 0701 - 08/03 0700 In:  960 [P.O.:960] Out: 4350 [Urine:4350] Lab Results  Component Value Date   CREATININE 1.51 (H) 08/11/2019   CREATININE 1.82 (H) 08/10/2019   CREATININE 1.85 (H) 08/09/2019      2. Acute exacerbation of chronic systolic and diastolic congestive heart failure and generalized edema: echocardiogram on 06/22/19.  EF of 25 to 09%, grade 3 diastolic dysfunction.  Moderately elevated pulmonary artery systolic pressure, right ventricle severely enlarged.  Estimated RVSP 60 mm suggesting pulmonary hypertension -Maintain the patient on torsemide as she has good urine output of 4.3 L.  08/02 0701 - 08/03 0700 In: 960 [P.O.:960] Out: 4350 [Urine:4350]    LOS: 15 Katelyn Lane 8/3/202112:07 PM

## 2019-08-11 NOTE — TOC Transition Note (Signed)
Transition of Care [facility]) - CM/SW Discharge Note   Patient Details  Name: Robert Sunga MRN: [id number] Date of Birth: 06/03/1955  Transition of Care [facility]) CM/SW Contact:  Meriel Flavors, LCSW Phone Number: 08/11/2019, 12:12 PM   Clinical Narrative:    CSW spoke with my supervisior about this patient, when she is medically ready we can discharge her. I am taking her resources now, we will prvide taxi voucher when discharged         Patient Goals and CMS Choice        Discharge Placement                       Discharge Plan and Services                                     Social Determinants of Health (SDOH) Interventions     Readmission Risk Interventions Readmission Risk Prevention Plan 07/27/2019 03/27/2019 03/09/2019  Transportation Screening Complete Complete Complete  Medication Review Press photographer) - (No Data) Referral to Pharmacy  PCP or Specialist appointment within 3-5 days of discharge Complete Complete Complete  HRI or Home Care Consult Complete Complete -  SW Recovery Care/Counseling Consult Complete Complete -  Palliative Care Screening Not Applicable Not Applicable Not [place] Not Applicable Not Applicable Not Applicable  Some recent data might be hidden

## 2019-08-11 NOTE — Progress Notes (Signed)
PROGRESS NOTE    Katelyn Lane   HYQ:[id number]  DOB: July 15, 1955  PCP: [name], MD    DOA: 07/26/2019 LOS: 56   Brief Narrative   64 year old female with known history of chronic systolic heart failure, recent COVID-19 infection, hyperlipidemia and hypertension, stage IIIb chronic kidney disease presented to the emergency room with acute onset of worsening dyspnea associated with dry cough wheezing, orthopnea and PND as well lower extremity edema for 1 week.  Patient had previous admissions with noncompliance.  She smokes cocaine and she is unfortunately homeless. At the emergency room vitals were stable.  BUN 52/creatinine 2.39.  BNP 4500.  Chest x-ray showed cardiomegaly and interstitial pulmonary edema.  Patient was given IV Lasix and admission requested for CHF exacerbation.     Assessment & Plan   Active Problems:   Hyperlipidemia   Depression   CKD (chronic kidney disease), stage III   Acute metabolic encephalopathy   History of substance abuse (HCC)   Chronic obstructive pulmonary disease (HCC)   Pulmonary hypertension (HCC)   Acute on chronic combined systolic (congestive) and diastolic (congestive) heart failure (HCC)   History of anemia due to chronic kidney disease   Prolonged QT interval   Acute CHF (congestive heart failure) (HCC)   Chest pain - Resolved. Reported on 7/31, waxing/waning over past few days, but not previously mentioned.  Seems unlikely ACS.  Troponin 210 >>199.  EKG ordered not obtained.  No further episodes.    Acute on chronic combined congestive heart failure -  Continues to be volume overloaded with pitting edema. Echocardiogram on 06/22/2019 EF 95%, grade 3 diastolic dysfunction, moderate pulmonary hypertension, severely enlarged right ventricle.  Cardiology and nephrology are following.   Lasix infusion was stopped today.  Started on oral torsemide. Net fluid balance is -12.8 L to date with over 3390cc out yesterday. Continue  Coreg and spironolactone.  Hold Entresto. Diuresis changes per cardiology and nephrology.  Currently on torsemide 40 mg PO daily.   AKI on CKD stage IIIb -AKI present on admission, likely secondary to CHF and cardiorenal syndrome.  Improving with  diuresis.  Nephrology is following.  Renal ultrasound was negative for obstruction.   Hypokalemia - due to diuresis.  Replaced.  BMP to monitor, replace as needed.  Hypotension - due to ongoing diuresis and beta blocker.  Added low dose midodrine, increase as needed.  Hx of Hypertension -recently hypotensive.  Continue Coreg but hold for hypotension or bradycardia.  Hold Entresto given AKI.  Anemia of chronic disease / iron deficiency - stable, no evidence of bleeding.  Monitor CBC.  Received IV Venofer.  Started on oral iron supplement.  Prolonged QT interval - POA with QTc 507 on EKG.  Avoid QT-prolonging agents as much as possible.  Stopped tramadol.  Monitor closely.  Maintain K>4.0 and Mg>2.0.  Started daily MagOx.  Nonsustained V-tach - has history during previous admissions as well.  Monitor on telemetry.  Maintain K>4.0 and Mg>2.0.  Bilateral foot pain - unclear if due to tense edema in her feet, or worsened neuropathy.  Already on gabapentin, will not increase given renal function.  PRN Norco or Tylenol.  Elevate legs.    Acute metabolic encephalopathy - with confusion, intermittent agitation.  Suspect underlying psychiatric disorder.  Psych consult pending as of 7/30.  History of ventricular thrombus -continue Xarelto.  Social - Homeless.  Severe medical issues.    APS is on board and pursuing guardianship and placement.  APS requires psychiatric evaluation,  consult order has been placed.  We will have to be seen by rounding psych provider over the weekend. Last admission, was discharged to SNF/rehab, but signed herself out.  SNF placement unlikely given ongoing drug abuse.  Patient would have to agree to substance abuse rehab.     Depression/anxiety- chronic, stable.  Evaluated by psychiatry per APS' request and given behavioral disturbances.  Does not meet criteria for inpatient psych admission.  Continue Prozac and as needed Xanax.   Obesity: Body mass index is 31.12 kg/m.  Complicates overall care and prognosis.  DVT prophylaxis:  rivaroxaban (XARELTO) tablet 20 mg   Diet:  Diet Orders (From admission, onward)    Start     Ordered   07/27/19 0209  Diet 2 gram sodium Room service appropriate? Yes; Fluid consistency: Thin  Diet effective now       Question Answer Comment  Room service appropriate? Yes   Fluid consistency: Thin      07/27/19 0228            Code Status: Full Code    Subjective 08/11/19    Patient seen this morning at bedside.  No acute events reported overnight.  She was sleeping during encounter.  Did wake up to voice but would not engage in conversation today.  She refused to work with mobility tech yesterday.  Disposition Plan & Communication   Status is: Inpatient  Remains inpatient appropriate because:IV treatments appropriate due to intensity of illness or inability to take PO.  Patient continues to be volume overloaded at this time, undergoing diuresis, continues to require close monitoring of renal function and electrolytes.      Dispo:  Patient From: Home  (was reportedly homeless PTA)  Planned Disposition: Home (?)    Expected discharge date: 1 day  Medically stable for discharge: No    Family Communication: None at bedside, will attempt to call   Consults, Procedures, Significant Events   Consultants:   Nephrology  Cardiology  Psychiatry  Procedures:   None  Antimicrobials:   None   Objective   Vitals:   08/11/19 0354 08/11/19 0857 08/11/19 1146 08/11/19 1659  BP: 101/79 110/79 103/74 121/88  Pulse: 79 82 77 81  Resp:  16  18  Temp: 98.8 F (37.1 C) 98.5 F (36.9 C) 98 F (36.7 C) 98.1 F (36.7 C)  TempSrc:  Oral    SpO2: 99% 100%  96% 100%  Weight: 90.1 kg     Height:        Intake/Output Summary (Last 24 hours) at 08/11/2019 1740 Last data filed at 08/11/2019 1720 Gross per 24 hour  Intake 2160 ml  Output 2750 ml  Net -590 ml   Filed Weights   08/08/19 0333 08/10/19 0440 08/11/19 0354  Weight: (!) 107.7 kg (!) 100.2 kg 90.1 kg    Physical Exam:  General exam: Sleeping comfortably, no acute distress Respiratory system: CTAB, no wheezes or rhonchi, normal respiratory effort. Cardiovascular system: normal S1/S2, RRR, 2+ pitting edema BLE's (increasing), feet still with tense edema  Gastrointestinal: Soft and nontender with positive bowel sounds   Labs   Data Reviewed: I have personally reviewed following labs and imaging studies  CBC: Recent Labs  Lab 08/05/19 0412 08/06/19 0552 08/09/19 0612 08/10/19 0553 08/11/19 0539  WBC 9.6 10.3 9.9 8.7 9.3  NEUTROABS 6.6  --   --   --   --   HGB 7.8* 8.5* 7.6* 7.6* 7.6*  HCT 26.6* 27.0* 24.1* 25.7*  25.7*  MCV 70.6* 69.1* 66.6* 69.8* 69.8*  PLT 339 352 428* 490* 740*   Basic Metabolic Panel: Recent Labs  Lab 08/06/19 0552 08/06/19 0552 08/07/19 0613 08/08/19 1742 08/09/19 0612 08/10/19 0553 08/11/19 0539  NA 135   < > 134* 134* 133* 133* 135  K 4.1   < > 4.1 3.4* 3.0* 3.5 3.9  CL 95*   < > 96* 91* 93* 92* 91*  CO2 30   < > 28 31 30  32 35*  GLUCOSE 152*   < > 121* 165* 112* 148* 141*  BUN 45*   < > 50* 53* 54* 52* 52*  CREATININE 1.92*   < > 1.97* 1.95* 1.85* 1.82* 1.51*  CALCIUM 8.4*   < > 8.5* 8.8* 8.7* 8.6* 9.1  MG 2.0  --   --  2.2 1.9 2.2 2.1   < > = values in this interval not displayed.   GFR: Estimated Creatinine Clearance: 43.9 mL/min (A) (by C-G formula based on SCr of 1.51 mg/dL (H)). Liver Function Tests: No results for input(s): AST, ALT, ALKPHOS, BILITOT, PROT, ALBUMIN in the last 168 hours. No results for input(s): LIPASE, AMYLASE in the last 168 hours. No results for input(s): AMMONIA in the last 168 hours. Coagulation  Profile: No results for input(s): INR, PROTIME in the last 168 hours. Cardiac Enzymes: No results for input(s): CKTOTAL, CKMB, CKMBINDEX, TROPONINI in the last 168 hours. BNP (last 3 results) No results for input(s): PROBNP in the last 8760 hours. HbA1C: No results for input(s): HGBA1C in the last 72 hours. CBG: Recent Labs  Lab 08/10/19 2117  GLUCAP 131*   Lipid Profile: No results for input(s): CHOL, HDL, LDLCALC, TRIG, CHOLHDL, LDLDIRECT in the last 72 hours. Thyroid Function Tests: No results for input(s): TSH, T4TOTAL, FREET4, T3FREE, THYROIDAB in the last 72 hours. Anemia Panel: Recent Labs    08/08/19 1742 08/08/19 1821  VITAMINB12  --  542  FOLATE 8.8  --   FERRITIN 113  --   TIBC 230*  --   IRON 14*  --   RETICCTPCT 1.3  --    Sepsis Labs: No results for input(s): PROCALCITON, LATICACIDVEN in the last 168 hours.  No results found for this or any previous visit (from the past 240 hour(s)).    Imaging Studies   No results found.   Medications   Scheduled Meds: . aspirin EC  81 mg Oral Daily  . atorvastatin  20 mg Oral Daily  . carvedilol  3.125 mg Oral BID WC  . dextromethorphan-guaiFENesin  1 tablet Oral BID  . FLUoxetine  20 mg Oral Daily  . gabapentin  300 mg Oral TID  . magnesium oxide  400 mg Oral Daily  . midodrine  2.5 mg Oral TID WC  . multivitamin with minerals  1 tablet Oral Daily  . Ensure Max Protein  11 oz Oral BID  . rivaroxaban  20 mg Oral Q supper  . sodium chloride flush  3 mL Intravenous Once  . sodium chloride flush  3 mL Intravenous Q12H  . torsemide  40 mg Oral Daily   Continuous Infusions: . sodium chloride Stopped (08/09/19 1233)       LOS: 15 days    Time spent: 20 minutes    Ezekiel Slocumb, DO [facility]  08/11/2019, 5:40 PM    If 7PM-7AM, please contact night-coverage. How to contact the [facility] Attending or Consulting provider [place] or covering provider during after hours 7P -  7A, for this patient?     1. Check the care team in [facility] and look for a) attending/consulting TRH provider listed and b) the [facility] team listed 2. Log into [email or web address] and use [place]'s universal password to access. If you do not have the password, please contact the hospital operator. 3. Locate the [facility] provider you are looking for under [facility] and page to a number that you can be directly reached. 4. If you still have difficulty reaching the provider, please page the [facility] (Director on Call) for the Hospitalists listed on amion for assistance.

## 2019-08-11 NOTE — TOC Progression Note (Signed)
Transition of Care [facility]) - Progression Note    Patient Details  Name: Katelyn Lane MRN: [id number] Date of Birth: [birth year]  Transition of Care [facility]) CM/SW [place],  Phone Number: 08/11/2019, 10:51 AM  Clinical Narrative:    CSW was contacted by APS, A representative came out to assess patient and determine if appropriate for DSS guardianship. CSW has not heard back as to determination of assessment from APS worker         Expected Discharge Plan and Services                                                 Social Determinants of Health ([place]) Interventions    Readmission Risk Interventions Readmission Risk Prevention Plan 07/27/2019 03/27/2019 03/09/2019  Transportation Screening Complete Complete Complete  Medication Review Press photographer) - (No Data) Referral to Pharmacy  PCP or Specialist appointment within 3-5 days of discharge Complete Complete Complete  HRI or Home Care Consult Complete Complete -  SW Recovery Care/Counseling Consult Complete Complete -  Palliative Care Screening Not Applicable Not Applicable Not [place] Not Applicable Not Applicable Not Applicable  Some recent data might be hidden

## 2019-08-11 NOTE — Progress Notes (Signed)
PT Cancellation Note  Patient Details Name: Katelyn Lane MRN: [id number] DOB: [birth year]   Cancelled Treatment:    Reason Eval/Treat Not Completed: Patient declined, no reason specified (After several attempts to encourage pt to participate, pt clearly refused several times and stated "well it just it won't get done". will re-attempt at later time/date as medically appropriate.)   Everlean Alstrom. Graylon Good, PT, DPT 08/11/19, 11:47 AM

## 2019-08-11 NOTE — Progress Notes (Signed)
°   08/11/19 1300  Clinical Encounter Type  Visited With Patient  Visit Type Follow-up  Referral From Chaplain  Consult/Referral To Chaplain  Chaplain stopped in to see patient. Patient was getting ready to eat lunch. Patient said that she feel much better at having a good bowel movement. Chaplain will check in on patient tomorrow.

## 2019-08-11 NOTE — Progress Notes (Signed)
OT Cancellation Note  Patient Details Name: Katelyn Lane MRN: [id number] DOB: 04-12-1955   Cancelled Treatment:    Reason Eval/Treat Not Completed: Patient declined, no reason specified;Fatigue/lethargy limiting ability to participate. Order received and chart reviewed. Attempted to see pt for OT evaluation this date. Upon arrival to pt room, pt sleeping, wakes to VCs. Politely declines participation in therapy at this time. Requests therapist try back next date. Will hold OT evaluation at this time and re-attempt next date as available and pt medically appropriate for OT evaluation.   Shara Blazing, M.S., OTR/L Ascom: [phone] 08/11/19, 2:39 PM

## 2019-08-12 DIAGNOSIS — G9341 Metabolic encephalopathy: Secondary | ICD-10-CM

## 2019-08-12 DIAGNOSIS — J449 Chronic obstructive pulmonary disease, unspecified: Secondary | ICD-10-CM

## 2019-08-12 LAB — BASIC METABOLIC PANEL
Anion gap: 10 (ref 5–15)
BUN: 48 mg/dL — ABNORMAL HIGH (ref 8–23)
CO2: 34 mmol/L — ABNORMAL HIGH (ref 22–32)
Calcium: 9.3 mg/dL (ref 8.9–10.3)
Chloride: 94 mmol/L — ABNORMAL LOW (ref 98–111)
Creatinine, Ser: 1.52 mg/dL — ABNORMAL HIGH (ref 0.44–1.00)
GFR calc Af Amer: 42 mL/min — ABNORMAL LOW (ref >60)
GFR calc non Af Amer: 36 mL/min — ABNORMAL LOW (ref >60)
Glucose, Bld: 122 mg/dL — ABNORMAL HIGH (ref 70–99)
Potassium: 3.6 mmol/L (ref 3.5–5.1)
Sodium: 138 mmol/L (ref 135–145)

## 2019-08-12 LAB — MAGNESIUM: Magnesium: 2 mg/dL (ref 1.7–2.4)

## 2019-08-12 MED ORDER — POTASSIUM CHLORIDE CRYS ER 20 MEQ PO TBCR
40.0000 meq | EXTENDED_RELEASE_TABLET | Freq: Once | ORAL | Status: AC
  Administered 2019-08-12: 40 meq via ORAL
  Filled 2019-08-12: qty 2

## 2019-08-12 NOTE — Evaluation (Signed)
Physical Therapy Evaluation Patient Details Name: Katelyn Lane MRN: [id number] DOB: 12-16-1955 Today's Date: 08/12/2019   History of Present Illness  64 year old female with known history of chronic systolic heart failure, recent COVID-19 infection, hyperlipidemia and hypertension, stage IIIb chronic kidney disease presented to the emergency room 07/27/2019 with acute onset of worsening dyspnea associated with dry cough wheezing, orthopnea and PND as well lower extremity edema for 1 week.  Patient had previous admissions with noncompliance.  She smokes cocaine and she is unfortunately homeless. Admitted for acute on chronic systolic and diastolic CHF and AKI on sage IIIb CKD. The patient had a 2D echo on 06/22/2019 that revealed an EF of 25 to 30% with grade 3 diastolic dysfunction, severe left atrial and right atrial dilatation as well as mild mitral regurgitation. Chest x-ray showed cardiomegaly and interstitial pulmonary edema.    Clinical Impression  Two mobility techs accompanied/assisted PT during evaluation to improve patient participation and comfort. Patient is very directive and able to provide limited history. Per chart appears to be homeless but was I with ADLs prior to hospital admission. Pt reports she ambulated mod I with SPC and was I with IADLs. Upon PT eval, patient requires careful communication and coaxing to participate and prefers directing session. Requires increased time to perform activities. Patient required min A for supine to sit and supervision for sit to supine. She was able to scoot herself up the bed by weight bearing through hands and feet with forward lean to lift her buttocks but requires CGA for safety to ensure she did not lose her balance forward. She required mod A +3 people for sit to stand at edge of bed to RW and required cuing for hand placement and to extend hips and knees (successful second attempt). Pt complained of significant B foot pain upon weight bearing and  declined further transfer or ambulation attempts following two times coming to stand with max time spent standing about 10 seconds. Patient appears to have experienced a significant decline in functional independence and requires heavy caregiver assistance and would benefit from short term rehab prior to discharging to home setting. Patient would benefit from skilled physical therapy to address impairments and functional limitations (see PT Problem List below) to work towards stated goals and return to PLOF or maximal functional independence.       Follow Up Recommendations SNF    Equipment Recommendations  Rolling walker with 5" wheels;3in1 (PT);Wheelchair (measurements PT)    Recommendations for Other Services       Precautions / Restrictions Precautions Precautions: Fall Precaution Comments: High fall Restrictions Weight Bearing Restrictions: No      Mobility  Bed Mobility Overal bed mobility: Needs Assistance Bed Mobility: Supine to Sit;Sit to Supine;Rolling Rolling: Independent   Supine to sit: Supervision Sit to supine: Supervision;Min assist   General bed mobility comments: pt reached for handheld assis with R UE to come to sitting.  Transfers Overall transfer level: Needs assistance Equipment used: Rolling walker (2 wheeled) Transfers: Sit to/from Stand;Lateral/Scoot Transfers Sit to Stand: Mod assist;From elevated surface;+2 physical assistance        Lateral/Scoot Transfers: Min guard General transfer comment: Pt attempted sit <> stand at edge of elevated bed x 2 repetitions with mod A + 3 people (one at each arm, one bracing RW). Needed step by step cuing for hand placment and max encouragement to participate. Came to full stand second attempt with and stood for approx 10 seconds before sitting with failure to reach back  with hands. Reports increased foot pain as limiting factor to continue. Pt scooted up to head of bead with CGA for safety to be sure she did not  tip forward out of bed. Pt declined to attempt transfer to chair.  Ambulation/Gait             General Gait Details: no attempt at ambulation due to difficulty with standing and transfer attempts.  Stairs            Wheelchair Mobility    Modified Rankin (Stroke Patients Only)       Balance Overall balance assessment: Needs assistance Sitting-balance support: No upper extremity supported;Feet supported Sitting balance-Leahy Scale: Normal Sitting balance - Comments: Patient able to throw her trunk back and forth towards the edge of the bed while sitting on edge of bed as if she is doing a crunch. She performed this behavior after she became aware PT was concerned about her falling backwards.   Standing balance support: During functional activity;Bilateral upper extremity supported Standing balance-Leahy Scale: Poor Standing balance comment: Requires assist of 3 people to maintain standing balance at EOB using heavy UE support on RW, able to stand for ~ 10 seconds                             Pertinent Vitals/Pain Pain Assessment: Faces Pain Score: 9  Faces Pain Scale: Hurts even more Pain Location: B feet when standing, B hands Pain Descriptors / Indicators: Grimacing;Guarding Pain Intervention(s): Limited activity within patient's tolerance;Monitored during session;Repositioned    Home Living Family/patient expects to be discharged to:: Unsure Living Arrangements: Alone               Additional Comments: Discharge/home set-up uncertain. Per chart, pt appears to be homeless/possibly living out of her car. Will continue to assess.    Prior Function Level of Independence: Independent         Comments: Per chart, pt was independent for ADL management at baseline ~ 1 month ago. She reports having a RW, but does not use it consistently. Pt reports she was I for mobility and IALDs with cane. states she drove but her car is in the shop.     Hand  Dominance   Dominant Hand: Left    Extremity/Trunk Assessment   Upper Extremity Assessment Upper Extremity Assessment: Generalized weakness    Lower Extremity Assessment Lower Extremity Assessment: Generalized weakness    Cervical / Trunk Assessment Cervical / Trunk Assessment: Kyphotic  Communication   Communication: No difficulties  Cognition Arousal/Alertness: Awake/alert Behavior During Therapy: Restless Overall Cognitive Status: No family/caregiver present to determine baseline cognitive functioning                                 General Comments: Pt very directive of care, generally A&O, however has some difficulty following VCs during session. Some difficulty with word finding noted t/o session. States she cannot remember at times.      General Comments General comments (skin integrity, edema, etc.): vitals appeared [facility] durin session    Exercises Other Exercises Other Exercises: Patient educated on role of PT in acute care setting. importance of mobility, motivational techniques used to improve participation, cuing for safe transfers. Other Exercises: seated long arc quad x 10 each side, seated hip flexion x 10 each side for improved strength, decreased edema   Assessment/Plan  PT Assessment Patient needs continued PT services  PT Problem List Decreased strength;Decreased coordination;Pain;Decreased cognition;Decreased activity tolerance;Decreased knowledge of use of DME;Decreased balance;Decreased safety awareness;Decreased mobility;Decreased knowledge of precautions       PT Treatment Interventions DME instruction;Balance training;Gait training;Stair training;Functional mobility training;Therapeutic activities;Therapeutic exercise;Patient/family education;Cognitive remediation;Neuromuscular re-education    PT Goals (Current goals can be found in the Care Plan section)  Acute Rehab PT Goals Patient Stated Goal: to feel better PT Goal Formulation:  With patient Time For Goal Achievement: 08/26/19 Potential to Achieve Goals: Good    Frequency Min 2X/week   Barriers to discharge Decreased caregiver support;Inaccessible home environment per chart she appears to be currently homeless with no caregivers    Co-evaluation PT/OT/SLP Co-Evaluation/Treatment:  (PT with two mobility techs assist to improve pt participation due to prior relationship built with mob techs)             AM-PAC PT "6 Clicks" Mobility  Outcome Measure Help needed turning from your back to your side while in a flat bed without using bedrails?: A Little Help needed moving from lying on your back to sitting on the side of a flat bed without using bedrails?: A Little Help needed moving to and from a bed to a chair (including a wheelchair)?: A Lot Help needed standing up from a chair using your arms (e.g., wheelchair or bedside chair)?: A Lot Help needed to walk in hospital room?: Total Help needed climbing 3-5 steps with a railing? : Total 6 Click Score: 12    End of Session Equipment Utilized During Treatment: Gait belt Activity Tolerance: Patient limited by pain;Treatment limited secondary to agitation (patient gets upset when pushed to participate) Patient left: in bed;Other (comment);with call bell/phone within reach;with bed alarm set (physician and visitor from adult protective services in room) Nurse Communication: Mobility status PT Visit Diagnosis: Unsteadiness on feet (R26.81);Muscle weakness (generalized) (M62.81);Difficulty in walking, not elsewhere classified (R26.2)    Time: 5277-8242 PT Time Calculation (min) (ACUTE ONLY): 35 min   Charges:   PT Evaluation $PT Eval Moderate Complexity: 1 Mod PT Treatments $Therapeutic Activity: 8-22 mins        Everlean Alstrom. Graylon Good, PT, DPT 08/12/19, 12:48 PM

## 2019-08-12 NOTE — Progress Notes (Signed)
[facility]  ROUNDING NOTE   Subjective:   08/03 0701 - 08/04 0700 In: 2400 [P.O.:2400] Out: 2500 [Urine:2500] Patient continues to have good urine output. Lower extremity edema does appear to be slowly improving.   Objective:  Vital signs in last 24 hours:  Temp:  [98.1 F (36.7 C)-98.2 F (36.8 C)] 98.2 F (36.8 C) (08/04 0841) Pulse Rate:  [73-81] 79 (08/04 0841) Resp:  [18] 18 (08/04 0841) BP: (105-121)/(74-88) 113/74 (08/04 0841) SpO2:  [94 %-100 %] 97 % (08/04 0841) Weight:  [98 kg] 98 kg (08/04 0506)  Weight change: 7.893 kg Filed Weights   08/10/19 0440 08/11/19 0354 08/12/19 0506  Weight: (!) 100.2 kg 90.1 kg 98 kg    Intake/Output: I/O last 3 completed shifts: In: 3360 [P.O.:3360] Out: 4250 [Urine:4250]   Intake/Output this shift:  No intake/output data recorded.  Physical Exam: General: NAD   Head: Normocephalic, atraumatic. Moist oral mucosal membranes  Eyes: Anicteric  Lungs:  Clear bilateral, normal effort  Heart: Regular rate and rhythm  Abdomen:  Soft, nontender, BS present  Extremities: 2+ peripheral edema.  Neurologic: Nonfocal, moving all four extremities  Skin: No lesions        Basic Metabolic Panel: Recent Labs  Lab 08/08/19 1742 08/08/19 1742 08/09/19 0612 08/09/19 0612 08/10/19 0553 08/11/19 0539 08/12/19 0628  NA 134*  --  133*  --  133* 135 138  K 3.4*  --  3.0*  --  3.5 3.9 3.6  CL 91*  --  93*  --  92* 91* 94*  CO2 31  --  30  --  32 35* 34*  GLUCOSE 165*  --  112*  --  148* 141* 122*  BUN 53*  --  54*  --  52* 52* 48*  CREATININE 1.95*  --  1.85*  --  1.82* 1.51* 1.52*  CALCIUM 8.8*   < > 8.7*   < > 8.6* 9.1 9.3  MG 2.2  --  1.9  --  2.2 2.1 2.0   < > = values in this interval not displayed.    Liver Function Tests: No results for input(s): AST, ALT, ALKPHOS, BILITOT, PROT, ALBUMIN in the last 168 hours. No results for input(s): LIPASE, AMYLASE in the last 168 hours. No results for input(s): AMMONIA  in the last 168 hours.  CBC: Recent Labs  Lab 08/06/19 0552 08/09/19 0612 08/10/19 0553 08/11/19 0539  WBC 10.3 9.9 8.7 9.3  HGB 8.5* 7.6* 7.6* 7.6*  HCT 27.0* 24.1* 25.7* 25.7*  MCV 69.1* 66.6* 69.8* 69.8*  PLT 352 428* 490* 562*    Cardiac Enzymes: No results for input(s): CKTOTAL, CKMB, CKMBINDEX, TROPONINI in the last 168 hours.  BNP: Invalid input(s): POCBNP  CBG: Recent Labs  Lab 08/10/19 2117  GLUCAP 131*    Microbiology: Results for orders placed or performed during the hospital encounter of 06/19/19  SARS Coronavirus 2 by RT PCR (hospital order, performed in [facility] hospital lab) Nasopharyngeal Nasopharyngeal Swab     Status: None   Collection Time: 06/19/19  9:28 AM   Specimen: Nasopharyngeal Swab  Result Value Ref Range Status   SARS Coronavirus 2 NEGATIVE NEGATIVE Final    Comment: (NOTE) SARS-CoV-2 target nucleic acids are NOT DETECTED.  The SARS-CoV-2 RNA is generally detectable in upper and lower respiratory specimens during the acute phase of infection. The lowest concentration of SARS-CoV-2 viral copies this assay can detect is 250 copies / mL. A negative result does not preclude  SARS-CoV-2 infection and should not be used as the sole basis for treatment or other patient management decisions.  A negative result may occur with improper specimen collection / handling, submission of specimen other than nasopharyngeal swab, presence of viral mutation(s) within the areas targeted by this assay, and inadequate number of viral copies (<250 copies / mL). A negative result must be combined with clinical observations, patient history, and epidemiological information.  Fact Sheet for Patients:   StrictlyIdeas.no  Fact Sheet for Healthcare Providers: BankingDealers.co.za  This test is not yet approved or  cleared by the [place] FDA and has been authorized for detection and/or diagnosis of SARS-CoV-2  by FDA under an Emergency Use Authorization (EUA).  This EUA will remain in effect (meaning this test can be used) for the duration of the COVID-19 declaration under Section 564(b)(1) of the Act, 21 U.S.C. section 360bbb-3(b)(1), unless the authorization is terminated or revoked sooner.  Performed at [facility], [address] [place], [place] [address]     Coagulation Studies: No results for input(s): LABPROT, INR in the last 72 hours.  Urinalysis: No results for input(s): COLORURINE, LABSPEC, PHURINE, GLUCOSEU, HGBUR, BILIRUBINUR, KETONESUR, PROTEINUR, UROBILINOGEN, NITRITE, LEUKOCYTESUR in the last 72 hours.  Invalid input(s): APPERANCEUR    Imaging: No results found.   Medications:   . sodium chloride Stopped (08/09/19 1233)   . aspirin EC  81 mg Oral Daily  . atorvastatin  20 mg Oral Daily  . carvedilol  3.125 mg Oral BID WC  . dextromethorphan-guaiFENesin  1 tablet Oral BID  . ferrous sulfate  325 mg Oral Q breakfast  . FLUoxetine  20 mg Oral Daily  . gabapentin  300 mg Oral TID  . magnesium oxide  400 mg Oral Daily  . midodrine  2.5 mg Oral TID WC  . multivitamin with minerals  1 tablet Oral Daily  . Ensure Max Protein  11 oz Oral BID  . rivaroxaban  20 mg Oral Q supper  . sodium chloride flush  3 mL Intravenous Once  . sodium chloride flush  3 mL Intravenous Q12H  . torsemide  40 mg Oral Daily   sodium chloride, acetaminophen, ALPRAZolam, HYDROcodone-acetaminophen, LORazepam, sodium chloride flush  Assessment/ Plan:  Ms. Queenie Aufiero is a 64 y.o. black female with hypertension, hyperlipidemia, depression , polysubstance abuse, recent COVID-19 infection who was admitted to [facility] on 07/26/2019 for Acute CHF (congestive heart failure) ([place]) [I50.9] Acute on chronic congestive heart failure, unspecified heart failure type ([place]) [I50.9]  1. Acute renal failure on chronic kidney disease stage IIIB with proteinuria: baseline creatinine 1.7, GFR of 32 on  03/08/19. Suspect patient may have progression of chronic kidney disease from multiple episodes of acute kidney injury with limited recovery.  Chronic kidney disease has been thought to be secondary to hypertension in the past.  Acute renal failure secondary to acute cardiorenal syndrome.  No IV contrast exposure. Renal ultrasound without obstruction.  Shows diffuse increased echogenicity of the bilateral kidneys.  -Renal function stable.  Urine output yesterday was 2.5 L.  Continue to monitor renal function. 08/03 0701 - 08/04 0700 In: 2400 [P.O.:2400] Out: 2500 [Urine:2500] Lab Results  Component Value Date   CREATININE 1.52 (H) 08/12/2019   CREATININE 1.51 (H) 08/11/2019   CREATININE 1.82 (H) 08/10/2019      2. Acute exacerbation of chronic systolic and diastolic congestive heart failure and generalized edema: echocardiogram on 06/22/19.  EF of 25 to 86%, grade 3 diastolic dysfunction.  Moderately elevated  pulmonary artery systolic pressure, right ventricle severely enlarged.  Estimated RVSP 60 mm suggesting pulmonary hypertension -Patient maintained on torsemide 40 mg daily with good diuretic effect. 08/03 0701 - 08/04 0700 In: 2400 [P.O.:2400] Out: 2500 [Urine:2500]    LOS: 16 Gerda Yin 8/4/20212:23 PM

## 2019-08-12 NOTE — Progress Notes (Signed)
PROGRESS NOTE    Katelyn Lane   SJG:[id number]  DOB: 1955/07/13  PCP: [name], MD    DOA: 07/26/2019 LOS: 39   Brief Narrative   64 year old female with known history of chronic systolic heart failure, recent COVID-19 infection, hyperlipidemia and hypertension, stage IIIb chronic kidney disease presented to the emergency room with acute onset of worsening dyspnea associated with dry cough wheezing, orthopnea and PND as well lower extremity edema for 1 week.  Patient had previous admissions with noncompliance.  She smokes cocaine and she is unfortunately homeless. At the emergency room vitals were stable.  BUN 52/creatinine 2.39.  BNP 4500.  Chest x-ray showed cardiomegaly and interstitial pulmonary edema.  Patient was given IV Lasix and admission requested for CHF exacerbation.   Pt today is alert awake but is delirious and is restless and confused. APS at bedside along with nurse and PT at bedside Requesting that wee put into writing that pt is or may not be coherent or able to take care herself. Psych consult was already done.  Nephrology following pt and creatinine improving at 1.52 currently, with rec's for renally does meds and avoid nephrotoxic meds agents and contrast. PT following pt with recommendation for Rollator walker and SNF placement for continued rehab and pt for mobility.    Assessment & Plan   Active Problems:   Hyperlipidemia   Depression   CKD (chronic kidney disease), stage III   Acute metabolic encephalopathy   History of substance abuse (HCC)   Chronic obstructive pulmonary disease (HCC)   Pulmonary hypertension (HCC)   Acute on chronic combined systolic (congestive) and diastolic (congestive) heart failure (HCC)   History of anemia due to chronic kidney disease   Prolonged QT interval   Acute CHF (congestive heart failure) (HCC)   Chest pain - Resolved. Reported on 7/31, waxing/waning over past few days, but not previously mentioned.   Seems unlikely ACS.  Troponin 210 >>199.  EKG ordered not obtained.  No further episodes.    Acute on chronic combined congestive heart failure -  Continues to be volume overloaded with pitting edema. Echocardiogram on 06/22/2019 EF 65%, grade 3 diastolic dysfunction, moderate pulmonary hypertension, severely enlarged right ventricle.  Cardiology and nephrology are following.   Lasix infusion was stopped today.  Started on oral torsemide. Net fluid balance is -12.8 L to date with over 3390cc out yesterday. Continue Coreg and spironolactone.  Hold Entresto. Diuresis changes per cardiology and nephrology.  Currently on torsemide 40 mg PO daily.  . AKI on CKD stage IIIb -AKI present on admission, likely secondary to CHF and cardiorenal syndrome.  Improving with  diuresis.  Nephrology is following.  Renal ultrasound was negative for obstruction.   Hypokalemia - due to diuresis.  Replaced.  BMP to monitor, replace as needed.  Hypotension - due to ongoing diuresis and beta blocker.  Added low dose midodrine, increase as needed.  Hx of Hypertension -recently hypotensive.  Continue Coreg but hold for hypotension or bradycardia.  Hold Entresto given AKI.  Anemia of chronic disease / iron deficiency - stable, no evidence of bleeding.  Monitor CBC.  Received IV Venofer.  Started on oral iron supplement.  Prolonged QT interval - POA with QTc 507 on EKG.  Avoid QT-prolonging agents as much as possible.  Stopped tramadol.  Monitor closely.  Maintain K>4.0 and Mg>2.0.  Started daily MagOx.  Nonsustained V-tach - has history during previous admissions as well.  Monitor on telemetry.  Maintain K>4.0 and Mg>2.0.  Bilateral foot pain - unclear if due to tense edema in her feet, or worsened neuropathy.  Already on gabapentin, will not increase given renal function.  PRN Norco or Tylenol.  Elevate legs.    Acute metabolic encephalopathy - with confusion, intermittent agitation.  Suspect underlying psychiatric  disorder.  Psych consult pending as of 7/30.  History of ventricular thrombus -continue Xarelto.  Social - Homeless.  Severe medical issues.    APS is on board and pursuing guardianship and placement.  APS requires psychiatric evaluation, consult order has been placed.  We will have to be seen by rounding psych provider over the weekend. Last admission, was discharged to SNF/rehab, but signed herself out.  SNF placement unlikely given ongoing drug abuse.  Patient would have to agree to substance abuse rehab.    Depression/anxiety- chronic, stable.  Evaluated by psychiatry per APS' request and given behavioral disturbances.  Does not meet criteria for inpatient psych admission.  Continue Prozac and as needed Xanax.   Obesity: Body mass index is 33.85 kg/m.  Complicates overall care and prognosis.  DVT prophylaxis:  rivaroxaban (XARELTO) tablet 20 mg   Diet:  Diet Orders (From admission, onward)    Start     Ordered   08/12/19 1059  Diet 2 gram sodium Room service appropriate? Yes; Fluid consistency: Thin; Fluid restriction: 1200 mL Fluid  Diet effective now       Question Answer Comment  Room service appropriate? Yes   Fluid consistency: Thin   Fluid restriction: 1200 mL Fluid      08/12/19 1059            Code Status: Full Code    Subjective 08/12/19    Patient seen this morning at bedside.  No acute events reported overnight.  She was sleeping during encounter.  Did wake up to voice but would not engage in conversation today.  She refused to work with mobility tech yesterday.  Disposition Plan & Communication   Status is: Inpatient  Remains inpatient appropriate because:IV treatments appropriate due to intensity of illness or inability to take PO.  Patient continues to be volume overloaded at this time, undergoing diuresis, continues to require close monitoring of renal function and electrolytes.      Dispo:  Patient From: Home  (was reportedly homeless PTA)  Planned  Disposition: Home (?)    Expected discharge date: 1 day  Medically stable for discharge: No    Family Communication: None at bedside, will attempt to call   Consults, Procedures, Significant Events   Consultants:   Nephrology  Cardiology  Psychiatry  Procedures:   None  Antimicrobials:   None   Objective   Vitals:   08/11/19 1659 08/11/19 1927 08/12/19 0506 08/12/19 0841  BP: 121/88 105/79 107/79 113/74  Pulse: 81 73 81 79  Resp: 18   18  Temp: 98.1 F (36.7 C) 98.2 F (36.8 C) 98.2 F (36.8 C) 98.2 F (36.8 C)  TempSrc:  Oral Oral Oral  SpO2: 100% 99% 94% 97%  Weight:   98 kg   Height:        Intake/Output Summary (Last 24 hours) at 08/12/2019 1116 Last data filed at 08/12/2019 0700 Gross per 24 hour  Intake 1680 ml  Output 2500 ml  Net -820 ml   Filed Weights   08/10/19 0440 08/11/19 0354 08/12/19 0506  Weight: (!) 100.2 kg 90.1 kg 98 kg    Physical Exam:  General exam: Sleeping comfortably, no acute distress Respiratory  system: CTAB, no wheezes or rhonchi, normal respiratory effort. Cardiovascular system: normal S1/S2, RRR, 2+ pitting edema BLE's (increasing), feet still with tense edema  Gastrointestinal: Soft and nontender with positive bowel sounds   Labs   Data Reviewed: I have personally reviewed following labs and imaging studies  CBC: Recent Labs  Lab 08/06/19 0552 08/09/19 0612 08/10/19 0553 08/11/19 0539  WBC 10.3 9.9 8.7 9.3  HGB 8.5* 7.6* 7.6* 7.6*  HCT 27.0* 24.1* 25.7* 25.7*  MCV 69.1* 66.6* 69.8* 69.8*  PLT 352 428* 490* 485*   Basic Metabolic Panel: Recent Labs  Lab 08/08/19 1742 08/09/19 0612 08/10/19 0553 08/11/19 0539 08/12/19 0628  NA 134* 133* 133* 135 138  K 3.4* 3.0* 3.5 3.9 3.6  CL 91* 93* 92* 91* 94*  CO2 31 30 32 35* 34*  GLUCOSE 165* 112* 148* 141* 122*  BUN 53* 54* 52* 52* 48*  CREATININE 1.95* 1.85* 1.82* 1.51* 1.52*  CALCIUM 8.8* 8.7* 8.6* 9.1 9.3  MG 2.2 1.9 2.2 2.1 2.0   GFR: Estimated  Creatinine Clearance: 45.6 mL/min (A) (by C-G formula based on SCr of 1.52 mg/dL (H)). Liver Function Tests: No results for input(s): AST, ALT, ALKPHOS, BILITOT, PROT, ALBUMIN in the last 168 hours. No results for input(s): LIPASE, AMYLASE in the last 168 hours. No results for input(s): AMMONIA in the last 168 hours. Coagulation Profile: No results for input(s): INR, PROTIME in the last 168 hours. Cardiac Enzymes: No results for input(s): CKTOTAL, CKMB, CKMBINDEX, TROPONINI in the last 168 hours. BNP (last 3 results) No results for input(s): PROBNP in the last 8760 hours. HbA1C: No results for input(s): HGBA1C in the last 72 hours. CBG: Recent Labs  Lab 08/10/19 2117  GLUCAP 131*   Lipid Profile: No results for input(s): CHOL, HDL, LDLCALC, TRIG, CHOLHDL, LDLDIRECT in the last 72 hours. Thyroid Function Tests: No results for input(s): TSH, T4TOTAL, FREET4, T3FREE, THYROIDAB in the last 72 hours. Anemia Panel: No results for input(s): VITAMINB12, FOLATE, FERRITIN, TIBC, IRON, RETICCTPCT in the last 72 hours. Sepsis Labs: No results for input(s): PROCALCITON, LATICACIDVEN in the last 168 hours.  No results found for this or any previous visit (from the past 240 hour(s)).    Imaging Studies   No results found.   Medications   Scheduled Meds: . aspirin EC  81 mg Oral Daily  . atorvastatin  20 mg Oral Daily  . carvedilol  3.125 mg Oral BID WC  . dextromethorphan-guaiFENesin  1 tablet Oral BID  . ferrous sulfate  325 mg Oral Q breakfast  . FLUoxetine  20 mg Oral Daily  . gabapentin  300 mg Oral TID  . magnesium oxide  400 mg Oral Daily  . midodrine  2.5 mg Oral TID WC  . multivitamin with minerals  1 tablet Oral Daily  . Ensure Max Protein  11 oz Oral BID  . rivaroxaban  20 mg Oral Q supper  . sodium chloride flush  3 mL Intravenous Once  . sodium chloride flush  3 mL Intravenous Q12H  . torsemide  40 mg Oral Daily   Continuous Infusions: . sodium chloride Stopped  (08/09/19 1233)       LOS: 16 days    Time spent: 20 minutes  Para Skeans, MD [facility] [phone] 08/12/2019, 11:16 AM    If 7PM-7AM, please contact night-coverage. How to contact the [facility] Attending or Consulting provider [place] or covering provider during after hours [place], for this patient?  1. Check the care team in [facility] and look for a) attending/consulting [facility] provider listed and b) the [facility] team listed 2. Log into [email or web address] and use Lane's universal password to access. If you do not have the password, please contact the hospital operator. 3. Locate the [facility] provider you are looking for under [facility] and page to a number that you can be directly reached. 4. If you still have difficulty reaching the provider, please page the [facility] (Director on Call) for the Hospitalists listed on amion for assistance.

## 2019-08-12 NOTE — Evaluation (Signed)
Occupational Therapy Evaluation Patient Details Name: Katelyn Lane MRN: [id number] DOB: 11-12-1955 Today's Date: 08/12/2019    History of Present Illness Katelyn Lane  is a 64 y.o. African-American female with a known history of CHF, COVID-19, dyslipidemia, hypertension and stage IIIb chronic kidney disease, who presented to the emergency room with acute onset of worsening dyspnea with associated dry cough and wheezing as well as orthopnea and paroxysmal nocturnal dyspnea and lower extremity edema over the last week.  No chest pain or palpitations.  She admitted to missing doses of her Bumex sometimes, about 3 days a week.  She last smoked cocaine about 3 to 4 days ago.  She denied any fever or chills.  No nausea or vomiting or abdominal pain.  She was fairly somnolent but arousable arousable.  No dysuria, oliguria or hematuria or flank pain.   Clinical Impression   Ms. Conlee was seen for OT evaluation this date. Prior to hospital admission, pt was living alone, per chart appears to have an uncertain DC location and may be homeless. Currently pt demonstrates impairments as described below (See OT problem list) which functionally limit her ability to perform ADL/self-care tasks. Pt currently requires MOD A for STS, and is unable to attempt functional transfer 2/2 BLE pain, she requires set-up assist for seated UB grooming/bathing, and MIN A to don bilat socks while seated.  Pt would benefit from skilled OT services to address noted impairments and functional limitations (see below for any additional details) in order to maximize safety and independence while minimizing falls risk and caregiver burden. Upon hospital discharge, recommend STR to maximize pt safety and return to PLOF.      Follow Up Recommendations  SNF;Supervision - Intermittent    Equipment Recommendations  3 in 1 bedside commode    Recommendations for Other Services       Precautions / Restrictions Precautions Precautions:  Fall Precaution Comments: High fall Restrictions Weight Bearing Restrictions: No      Mobility Bed Mobility Overal bed mobility: Needs Assistance Bed Mobility: Rolling;Supine to Sit;Sit to Supine Rolling: Independent   Supine to sit: Supervision Sit to supine: Supervision;Min assist   General bed mobility comments: Min assist to bring B feet toward middle of bed after sit>sup transfer.  Transfers Overall transfer level: Needs assistance   Transfers: Sit to/from Stand;Lateral/Scoot Transfers Sit to Stand: Mod assist;From elevated surface        Lateral/Scoot Transfers: Supervision General transfer comment: Mod A to come to full stand at EOB, pt endorses increased pain in B feet with standing and is unable to attempt further mobility. Sits back on EOB x2 this date.    Balance Overall balance assessment: Needs assistance Sitting-balance support: Feet unsupported;No upper extremity supported Sitting balance-Leahy Scale: Good Sitting balance - Comments: Steady static and dynamic sitting at EOB performing functional tasks. Pt fatigues with progression of session and is noted with anterior lean when seated after mobilty attempts.   Standing balance support: During functional activity;Bilateral upper extremity supported Standing balance-Leahy Scale: Poor Standing balance comment: Requires assist to maintain standing balance at EOB, is unable to stand for >30 seconds per trial.                           ADL either performed or assessed with clinical judgement   ADL Overall ADL's : Needs assistance/impaired  General ADL Comments: Pt functionally limited by decreased safety awareness, generalized weakness, BLE pain, and decreased activity tolerance. She rerquires set-up assist to perform seated UB grooming at EOB this date.She is able to don bilat socks with MIN A to thread L sock over L foot while seated.  Supervision  for bed mobility, however pt requires heavy assist to come to standing at EOB with use of RW and is unable to attempt stepping at EOB. Per nsg, pt requires variable level of assist but has been able to get up to [facility].     Vision         Perception     Praxis      Pertinent Vitals/Pain Pain Assessment: Faces Faces Pain Scale: Hurts a little bit Pain Location: B feet/legs. Pain Descriptors / Indicators: Burning;Sharp Pain Intervention(s): Limited activity within patient's tolerance;Monitored during session;Repositioned     Hand Dominance Left   Extremity/Trunk Assessment Upper Extremity Assessment Upper Extremity Assessment: Generalized weakness   Lower Extremity Assessment Lower Extremity Assessment: Generalized weakness       Communication Communication Communication: No difficulties   Cognition Arousal/Alertness: Awake/alert Behavior During Therapy: Restless Overall Cognitive Status: No family/caregiver present to determine baseline cognitive functioning                                 General Comments: Pt very directive of care, generally A&O, however has some difficulty following VCs during session. Some difficulty with word finding noted t/o session.   General Comments  Pt vitals montiored t/o session, spO2 noted to range from 88-94% during session and improves with therapeutic rest break/PLB. HR remains 90-92 with activity.    Exercises Other Exercises Other Exercises: Pt educated on role of OT in acute setting, safe use of AE/DME for ADL management, energy conservation strategies including PLB and activity pacing, and falls prevention during session. Would benefit from reinforcement. Other Exercises: Ot facilitates: seated UB bathing, grooming, and oral care, LB dressing task, and functional mobility. See ADL section for additional details regarding occupational performance. Vitals monitored t/o.   Shoulder Instructions      Home Living  Family/patient expects to be discharged to:: Unsure Living Arrangements: Alone                               Additional Comments: Discharge/home set-up uncertain. Per chart, pt appears to be homeless/possibly living out of her car. Will continue to assess.      Prior Functioning/Environment Level of Independence: Independent        Comments: Per chart, pt was independent for ADL management at baseline ~ 1 month ago. She reports having a RW, but does not use it consistently.        OT Problem List: Decreased strength;Decreased coordination;Cardiopulmonary status limiting activity;Decreased activity tolerance;Decreased safety awareness;Increased edema;Impaired balance (sitting and/or standing);Decreased knowledge of use of DME or AE      OT Treatment/Interventions: Self-care/ADL training;Therapeutic exercise;Therapeutic activities;DME and/or AE instruction;Patient/family education;Balance training;Energy conservation    OT Goals(Current goals can be found in the care plan section) Acute Rehab OT Goals Patient Stated Goal: To have less pain OT Goal Formulation: With patient Time For Goal Achievement: 08/26/19 ADL Goals Pt Will Perform Grooming: with modified independence;sitting (c LRAD PRN for improved safety/functional indep.) Pt Will Perform Lower Body Dressing: sit to/from stand;with supervision;with set-up (c LRAD PRN for improved safety/functional indep.)  Pt Will Transfer to Toilet: with set-up;with supervision;ambulating;bedside commode (c LRAD PRN for improved safety/functional indep.) Pt Will Perform Toileting - Clothing Manipulation and hygiene: sit to/from stand;with supervision;with set-up;with adaptive equipment (c LRAD PRN for improved safety/functional indep.)  OT Frequency: Min 1X/week   Barriers to D/C: Inaccessible home environment;Decreased caregiver support          Co-evaluation              AM-PAC OT "6 Clicks" Daily Activity     Outcome  Measure Help from another person eating meals?: None Help from another person taking care of personal grooming?: A Little Help from another person toileting, which includes using toliet, bedpan, or urinal?: A Little Help from another person bathing (including washing, rinsing, drying)?: A Lot Help from another person to put on and taking off regular upper body clothing?: A Little Help from another person to put on and taking off regular lower body clothing?: A Lot 6 Click Score: 17   End of Session Equipment Utilized During Treatment: Gait belt;Rolling walker Nurse Communication: Mobility status;Other (comment) (Pt found to have yellow capsule in bed, therapist left on nsg computer in room for RN to assess.)  Activity Tolerance: Patient tolerated treatment well Patient left: in bed;with call bell/phone within reach;with bed alarm set  OT Visit Diagnosis: Other abnormalities of gait and mobility (R26.89);Muscle weakness (generalized) (M62.81)                Time: 7989-2119 OT Time Calculation (min): 52 min Charges:  OT General Charges $OT Visit: 1 Visit OT Evaluation $OT Eval Moderate Complexity: 1 Mod OT Treatments $Self Care/Home Management : 38-52 mins  Shara Blazing, M.S., OTR/L Ascom: [phone] 08/12/19, 12:46 PM

## 2019-08-13 DIAGNOSIS — F320 Major depressive disorder, single episode, mild: Secondary | ICD-10-CM

## 2019-08-13 LAB — COMPREHENSIVE METABOLIC PANEL
ALT: 18 U/L (ref 0–44)
AST: 31 U/L (ref 15–41)
Albumin: 2.2 g/dL — ABNORMAL LOW (ref 3.5–5.0)
Alkaline Phosphatase: 226 U/L — ABNORMAL HIGH (ref 38–126)
Anion gap: 10 (ref 5–15)
BUN: 44 mg/dL — ABNORMAL HIGH (ref 8–23)
CO2: 34 mmol/L — ABNORMAL HIGH (ref 22–32)
Calcium: 9.7 mg/dL (ref 8.9–10.3)
Chloride: 95 mmol/L — ABNORMAL LOW (ref 98–111)
Creatinine, Ser: 1.74 mg/dL — ABNORMAL HIGH (ref 0.44–1.00)
GFR calc Af Amer: 36 mL/min — ABNORMAL LOW (ref >60)
GFR calc non Af Amer: 31 mL/min — ABNORMAL LOW (ref >60)
Glucose, Bld: 151 mg/dL — ABNORMAL HIGH (ref 70–99)
Potassium: 4.2 mmol/L (ref 3.5–5.1)
Sodium: 139 mmol/L (ref 135–145)
Total Bilirubin: 1.2 mg/dL (ref 0.3–1.2)
Total Protein: 6.1 g/dL — ABNORMAL LOW (ref 6.5–8.1)

## 2019-08-13 LAB — HEMOGLOBIN AND HEMATOCRIT, BLOOD
HCT: 31.3 % — ABNORMAL LOW (ref 36.0–46.0)
Hemoglobin: 9.2 g/dL — ABNORMAL LOW (ref 12.0–15.0)

## 2019-08-13 LAB — CBC WITH DIFFERENTIAL/PLATELET
Abs Immature Granulocytes: 0.04 10*3/uL (ref 0.00–0.07)
Basophils Absolute: 0.1 10*3/uL (ref 0.0–0.1)
Basophils Relative: 1 %
Eosinophils Absolute: 0.2 10*3/uL (ref 0.0–0.5)
Eosinophils Relative: 3 %
HCT: 26 % — ABNORMAL LOW (ref 36.0–46.0)
Hemoglobin: 7.5 g/dL — ABNORMAL LOW (ref 12.0–15.0)
Immature Granulocytes: 1 %
Lymphocytes Relative: 14 %
Lymphs Abs: 1.1 10*3/uL (ref 0.7–4.0)
MCH: 20.9 pg — ABNORMAL LOW (ref 26.0–34.0)
MCHC: 28.8 g/dL — ABNORMAL LOW (ref 30.0–36.0)
MCV: 72.6 fL — ABNORMAL LOW (ref 80.0–100.0)
Monocytes Absolute: 1 10*3/uL (ref 0.1–1.0)
Monocytes Relative: 14 %
Neutro Abs: 4.9 10*3/uL (ref 1.7–7.7)
Neutrophils Relative %: 67 %
Platelets: 607 10*3/uL — ABNORMAL HIGH (ref 150–400)
RBC: 3.58 MIL/uL — ABNORMAL LOW (ref 3.87–5.11)
RDW: 21.9 % — ABNORMAL HIGH (ref 11.5–15.5)
Smear Review: NORMAL
WBC: 7.3 10*3/uL (ref 4.0–10.5)
nRBC: 0 % (ref 0.0–0.2)

## 2019-08-13 LAB — MAGNESIUM: Magnesium: 2.1 mg/dL (ref 1.7–2.4)

## 2019-08-13 LAB — ABO/RH: ABO/RH(D): O POS

## 2019-08-13 LAB — PREPARE RBC (CROSSMATCH)

## 2019-08-13 MED ORDER — TORSEMIDE 20 MG PO TABS
10.0000 mg | ORAL_TABLET | Freq: Every day | ORAL | Status: DC
  Administered 2019-08-14 – 2019-08-16 (×3): 10 mg via ORAL
  Filled 2019-08-13 (×3): qty 1

## 2019-08-13 MED ORDER — SODIUM CHLORIDE 0.9% IV SOLUTION: Freq: Once | INTRAVENOUS | Status: AC

## 2019-08-13 NOTE — Consult Note (Signed)
[facility] Face-to-Face Psychiatry Consult   Reason for Consult: Psychiatric evaluation for capacity Referring Physician:  Dr. Arbutus Ped Patient Identification: Katelyn Lane MRN:  [id number] Principal Diagnosis: CKD Diagnosis:  Unspecified depressive disorder, in remission  Cocaine use disorder Tobacco use disorder Homelessness   Active Problems:   Depression   Hyperlipidemia   CKD (chronic kidney disease), stage III   Acute metabolic encephalopathy   History of substance abuse ([place])   Chronic obstructive pulmonary disease (HCC)   Pulmonary hypertension (HCC)   Acute on chronic combined systolic (congestive) and diastolic (congestive) heart failure (HCC)   History of anemia due to chronic kidney disease   Prolonged QT interval   Acute CHF (congestive heart failure) ([place])  Total Time spent with patient: 45 minutes  Person seen and evaluated in person this evening.  She rambled and confused at times, unable to answer basic questions like could she walk by herself or with her walker.  States she is "almost depressed", "Hell no" to if she was suicidal.  Sleep is "I don't, I don't know, I cough."  The longer the assessment went the more disorganized she became.  When discussed living situation, she stated, "No where I want to go.  Don't know where I am going."  Reports her nephew paid for her to stay in a hotel for two nights then came in and "I was butt naked."  Could not recall what happened at this time.  Rambled about having a boyfriend for 22 years she was living with until she gave him COVID, "dardnest thing, red necked stuff".  Agitated at times with loud voice.  When explored a care home, agreed to go, unsure if she understood an explanation of what one is.  "Not putting up with up and down stairs.  Jump out of here!"  Patient does not have capacity to make medical decisions at this time.  Appears she is clear and coherent at times during the day, definitely not this evening, may be  experiencing some sundowning.  Regardless, she cannot answer simple questions at times along with being disorganized an confused.  HPI per Dr Daneil Dolin on 7/31:   Patient is a 64 year old homeless, unemployed, single African-American female who was born and raised in [place].  She was admitted to the hospital on July 24, 2019 due to dyspnea in the setting of acute on chronic systolic and diastolic congestive heart failure.  There is indicate that she has a history of depression and is currently taking Prozac.  Patient says that she was never really diagnosed with depression and that Prozac really does not help.  States that she had been living with a friend prior to coming in here for 22 years but they had a falling apart and is now currently homeless.  She is not does not know where she will be going.  Patient says that she would be open to a long-term residential facility or placement.  She had reported to the nurse that she would want to be homeless instead so she could continue cocaine use.  Patient does have a history of cocaine use but says that she has not used in 5 to 6 weeks.  She smokes tobacco but no other drugs, according to her says that she has been feeling okay emotionally, and denies depression irritability or exacerbation of anxiety.  Denies a history of mania/hypomania hallucinations and delusions.  Denies having any stressors.  Says that she does not have any support from family and friend, never  has.  She has been eating okay.  She does have a prolonged history of sleep issues.  Denies a history of externalizing anger, says rather she gets angry at herself.  She does occasionally yell here but says that she does not get attention.  Yesterday, she refused to talk at times.  Does not able to tell me exactly why this was.  She denies suicidal or homicidal ideations.  She expresses strong religious convictions.  Past Psychiatric History: No history of behavioral health admissions.  No  history of psychiatric admissions.  No other medications other than Prozac.  Risk to Self:  unsafe to care for self Risk to Others:  none Prior Inpatient Therapy:  unknown Prior Outpatient Therapy:  unknown  Past Medical History:  Past Medical History:  Diagnosis Date  . CHF (congestive heart failure) ([place])   . COVID-19   . Hyperlipidemia   . Hypertension   . Renal disorder     Past Surgical History:  Procedure Laterality Date  . RIGHT/LEFT HEART CATH AND CORONARY ANGIOGRAPHY N/A 12/30/2018   Procedure: RIGHT/LEFT HEART CATH AND CORONARY ANGIOGRAPHY;  Surgeon: Corey Skains, MD;  Location: [facility];  Service: Cardiovascular;  Laterality: N/A;   Family History:  Family History  Problem Relation Age of Onset  . Breast cancer Neg Hx    Family Psychiatric  History: None Social History:  Social History   Substance and Sexual Activity  Alcohol Use No  . Alcohol/week: 0.0 standard drinks     Social History   Substance and Sexual Activity  Drug Use Yes  . Types: Cocaine   Comment: 3-4 days ago    Social History   Socioeconomic History  . Marital status: Single    Spouse name: Not on file  . Number of children: Not on file  . Years of education: Not on file  . Highest education level: Not on file  Occupational History  . Not on file  Tobacco Use  . Smoking status: Current Every Day Smoker  . Smokeless tobacco: Never Used  Substance and Sexual Activity  . Alcohol use: No    Alcohol/week: 0.0 standard drinks  . Drug use: Yes    Types: Cocaine    Comment: 3-4 days ago  . Sexual activity: Not on file  Other Topics Concern  . Not on file  Social History Narrative  . Not on file   Social Determinants of Health   Financial Resource Strain:   . Difficulty of Paying Living Expenses:   Food Insecurity:   . Worried About Charity fundraiser in the Last Year:   . Arboriculturist in the Last Year:   Transportation Needs:   . Film/video editor  (Medical):   Marland Kitchen Lack of Transportation (Non-Medical):   Physical Activity:   . Days of Exercise per Week:   . Minutes of Exercise per Session:   Stress:   . Feeling of Stress :   Social Connections:   . Frequency of Communication with Friends and Family:   . Frequency of Social Gatherings with Friends and Family:   . Attends Religious Services:   . Active Member of Clubs or Organizations:   . Attends Archivist Meetings:   Marland Kitchen Marital Status:    Additional Social History:    Allergies:   Allergies  Allergen Reactions  . Penicillins Hives, Rash and Other (See Comments)    Did it involve swelling of the face/tongue/throat, SOB, or low  BP? No Did it involve sudden or severe rash/hives, skin peeling, or any reaction on the inside of your mouth or nose? Yes Did you need to seek medical attention at a hospital or doctor's office? Yes When did it last happen? >25 years If all above answers are "NO", may proceed with cephalosporin use.   . Ace Inhibitors Nausea And Vomiting    Labs:  Results for orders placed or performed during the hospital encounter of 07/26/19 (from the past 48 hour(s))  Basic metabolic panel     Status: Abnormal   Collection Time: 08/12/19  6:28 AM  Result Value Ref Range   Sodium 138 135 - 145 mmol/L   Potassium 3.6 3.5 - 5.1 mmol/L   Chloride 94 (L) 98 - 111 mmol/L   CO2 34 (H) 22 - 32 mmol/L   Glucose, Bld 122 (H) 70 - 99 mg/dL    Comment: Glucose reference range applies only to samples taken after fasting for at least 8 hours.   BUN 48 (H) 8 - 23 mg/dL   Creatinine, Ser 1.52 (H) 0.44 - 1.00 mg/dL   Calcium 9.3 8.9 - 10.3 mg/dL   GFR calc non Af Amer 36 (L) >60 mL/min   GFR calc Af Amer 42 (L) >60 mL/min   Anion gap 10 5 - 15    Comment: Performed at [facility], [address] [place], [place] [address]  Magnesium     Status: None   Collection Time: 08/12/19  6:28 AM  Result Value Ref Range   Magnesium 2.0 1.7 - 2.4 mg/dL     Comment: Performed at [facility], [address] [place], [place] [address]  Prepare RBC (crossmatch)     Status: None   Collection Time: 08/13/19  8:21 AM  Result Value Ref Range   Order Confirmation      ORDER PROCESSED BY BLOOD BANK Performed at [facility], [address] [place], [place] [address]   CBC with Differential/Platelet     Status: Abnormal   Collection Time: 08/13/19  9:30 AM  Result Value Ref Range   WBC 7.3 4.0 - 10.5 K/uL   RBC 3.58 (L) 3.87 - 5.11 MIL/uL   Hemoglobin 7.5 (L) 12.0 - 15.0 g/dL    Comment: Reticulocyte Hemoglobin testing may be clinically indicated, consider ordering this additional test [id number]    HCT 26.0 (L) 36 - 46 %   MCV 72.6 (L) 80.0 - 100.0 fL   MCH 20.9 (L) 26.0 - 34.0 pg   MCHC 28.8 (L) 30.0 - 36.0 g/dL   RDW 21.9 (H) 11.5 - 15.5 %   Platelets 607 (H) 150 - 400 K/uL   nRBC 0.0 0.0 - 0.2 %   Neutrophils Relative % 67 %   Neutro Abs 4.9 1.7 - 7.7 K/uL   Lymphocytes Relative 14 %   Lymphs Abs 1.1 0.7 - 4.0 K/uL   Monocytes Relative 14 %   Monocytes Absolute 1.0 0 - 1 K/uL   Eosinophils Relative 3 %   Eosinophils Absolute 0.2 0 - 0 K/uL   Basophils Relative 1 %   Basophils Absolute 0.1 0 - 0 K/uL   WBC Morphology MORPHOLOGY UNREMARKABLE    RBC Morphology MIXED RBC POPULATION    Smear Review Normal platelet morphology    Immature Granulocytes 1 %   Abs Immature Granulocytes 0.04 0.00 - 0.07 K/uL   Target Cells PRESENT     Comment: Performed at [facility], [address] [place], [place] [address]  Comprehensive metabolic panel     Status: Abnormal   Collection Time: 08/13/19  9:30 AM  Result Value Ref Range   Sodium 139 135 - 145 mmol/L   Potassium 4.2 3.5 - 5.1 mmol/L   Chloride 95 (L) 98 - 111 mmol/L   CO2 34 (H) 22 - 32 mmol/L   Glucose, Bld 151 (H) 70 - 99 mg/dL    Comment: Glucose reference range applies only to samples taken after fasting for at least 8 hours.   BUN 44 (H) 8 -  23 mg/dL   Creatinine, Ser 1.74 (H) 0.44 - 1.00 mg/dL   Calcium 9.7 8.9 - 10.3 mg/dL   Total Protein 6.1 (L) 6.5 - 8.1 g/dL   Albumin 2.2 (L) 3.5 - 5.0 g/dL   AST 31 15 - 41 U/L   ALT 18 0 - 44 U/L   Alkaline Phosphatase 226 (H) 38 - 126 U/L   Total Bilirubin 1.2 0.3 - 1.2 mg/dL   GFR calc non Af Amer 31 (L) >60 mL/min   GFR calc Af Amer 36 (L) >60 mL/min   Anion gap 10 5 - 15    Comment: Performed at [facility], [address], [place], [place] [address]  Magnesium     Status: None   Collection Time: 08/13/19  9:30 AM  Result Value Ref Range   Magnesium 2.1 1.7 - 2.4 mg/dL    Comment: Performed at [facility], [address] [place], [place] [address]  Type and screen [place]     Status: None (Preliminary result)   Collection Time: 08/13/19  9:30 AM  Result Value Ref Range   ABO/RH(D) O POS    Antibody Screen NEG    Sample Expiration 08/16/2019,2359    Unit Number [id number]    Blood Component Type RED CELLS,LR    Unit division 00    Status of Unit ISSUED    Transfusion Status OK TO TRANSFUSE    Crossmatch Result      Compatible Performed at [facility], [address] [place], [place] [address]   Hemoglobin and hematocrit, blood     Status: Abnormal   Collection Time: 08/13/19  5:23 PM  Result Value Ref Range   Hemoglobin 9.2 (L) 12.0 - 15.0 g/dL   HCT 31.3 (L) 36 - 46 %    Comment: Performed at [facility], [address], [place],  [address]    Current Facility-Administered Medications  Medication Dose Route Frequency Provider Last Rate Last Admin  . 0.9 %  sodium chloride infusion  250 mL Intravenous PRN Mansy, Arvella Merles, MD   Stopped at 08/09/19 1233  . acetaminophen (TYLENOL) tablet 650 mg  650 mg Oral Q4H PRN Mansy, Jan A, MD   650 mg at 08/10/19 1749  . ALPRAZolam Duanne Moron) tablet 0.25 mg  0.25 mg Oral BID PRN Mansy, Jan A, MD   0.25 mg at 08/11/19 2212  . aspirin EC tablet 81 mg  81 mg  Oral Daily Mansy, Jan A, MD   81 mg at 08/13/19 0958  . atorvastatin (LIPITOR) tablet 20 mg  20 mg Oral Daily [name], MD   20 mg at 08/13/19 0957  . carvedilol (COREG) tablet 3.125 mg  3.125 mg Oral BID WC [name], MD   3.125 mg at 08/13/19 1616  . dextromethorphan-guaiFENesin (MUCINEX DM) 30-600 MG per 12 hr tablet 1 tablet  1 tablet  Oral BID Nicole Kindred A, DO   1 tablet at 08/13/19 5929  . ferrous sulfate tablet 325 mg  325 mg Oral Q breakfast Nicole Kindred A, DO   325 mg at 08/13/19 0957  . FLUoxetine (PROZAC) capsule 20 mg  20 mg Oral Daily [name], MD   20 mg at 08/13/19 2446  . gabapentin (NEURONTIN) capsule 300 mg  300 mg Oral TID Terrilee Croak, MD   300 mg at 08/13/19 1616  . HYDROcodone-acetaminophen (NORCO/VICODIN) 5-325 MG per tablet 1-2 tablet  1-2 tablet Oral Q6H PRN Nicole Kindred A, DO   2 tablet at 08/12/19 1704  . LORazepam (ATIVAN) injection 0.5 mg  0.5 mg Intravenous Q4H PRN Nicole Kindred A, DO   0.5 mg at 08/09/19 0149  . magnesium oxide (MAG-OX) tablet 400 mg  400 mg Oral Daily Nicole Kindred A, DO   400 mg at 08/13/19 2863  . midodrine (PROAMATINE) tablet 2.5 mg  2.5 mg Oral TID WC Nicole Kindred A, DO   2.5 mg at 08/13/19 1616  . multivitamin with minerals tablet 1 tablet  1 tablet Oral Daily Nicole Kindred A, DO   1 tablet at 08/13/19 0958  . rivaroxaban (XARELTO) tablet 20 mg  20 mg Oral Q supper [name], MD   20 mg at 08/13/19 1617  . sodium chloride flush (NS) 0.9 % injection 3 mL  3 mL Intravenous Once Carrie Mew, MD      . sodium chloride flush (NS) 0.9 % injection 3 mL  3 mL Intravenous Q12H Mansy, Jan A, MD   3 mL at 08/13/19 0959  . sodium chloride flush (NS) 0.9 % injection 3 mL  3 mL Intravenous PRN Mansy, Jan A, MD      . torsemide (DEMADEX) tablet 40 mg  40 mg Oral Daily Murlean Iba, MD   40 mg at 08/13/19 8177    Psychiatric Specialty Exam: Physical Exam Vitals and nursing note reviewed.   Constitutional:      Appearance: Normal appearance.  HENT:     Head: Normocephalic.     Nose: Nose normal.  Musculoskeletal:     Cervical back: Normal range of motion.  Neurological:     Mental Status: She is alert.  Psychiatric:        Attention and Perception: Attention and perception normal.        Mood and Affect: Mood is depressed.        Speech: Speech normal.        Behavior: Behavior normal.        Thought Content: Thought content normal.        Cognition and Memory: Cognition is impaired. Memory is impaired.        Judgment: Judgment is inappropriate.     Review of Systems  Constitutional: Positive for fatigue.  HENT: Negative.   Eyes: Negative.   Respiratory: Positive for cough and shortness of breath.   Cardiovascular: Negative.   Gastrointestinal: Negative.   Endocrine: Negative.   Genitourinary: Negative.   Musculoskeletal: Positive for gait problem.  Skin: Negative.   Allergic/Immunologic: Negative.   Hematological: Negative.   Psychiatric/Behavioral: Positive for confusion and dysphoric mood.    Blood pressure 123/87, pulse 80, temperature (!) 97.3 F (36.3 C), temperature source Oral, resp. rate 18, height 5\' 7"  (1.702 m), weight 95.6 kg, SpO2 94 %.Body mass index is 33 kg/m.   General Appearance: Well Groomed  Eye Contact:  Fair  Speech:  Slurred at times  Volume:  Normal to loud  Mood:   depressed  Affect:  Congruent  Thought Process:  Coherent at times, confused at other times  Orientation:  Person and place  Thought Content:  Disorganized  Suicidal Thoughts:  No  Homicidal Thoughts:  No  Memory:   fair to poor  Judgement:   Poor   Insight:  poor  Psychomotor Activity:  Decrease  Concentration:  Concentration: fair  Recall:  fair to poor  Fund of Knowledge:  fair   Language:  fair  Akathisia:  Negative  Handed:  Right  AIMS (if indicated):     Assets:  resilience  ADL's:  Impaired  Cognition: impaired--moderate     Treatment Plan  Summary: Patient does not have capacity to make medical decisions at this time.  She is confused at times with inappropriate answers and disorganized thought processes.  Cannot answer simple direct questions many times.  Overall, she is cooperative and pleasant.  Depression: -continue Prozac 20 mg daily or change to Celexa 20 mg daily, well tolerated in the elderly  Anxiety: -recommend discontinuing benzodiazepines for anxiety -Recommend hydroxyzine 10 mg QID PRN instead  Does not meet criteria for geriatric or adult psychiatric hospitalization.  Waylan Boga, NP 08/13/2019 5:40 PM

## 2019-08-13 NOTE — Progress Notes (Signed)
PROGRESS NOTE    Katelyn Lane   MWN:[id number]  DOB: Mar 19, 1955  PCP: Katelyn Burrow, MD    DOA: 07/26/2019 LOS: 5   Brief Narrative   64 year old female with known history of chronic systolic heart failure, recent COVID-19 infection, hyperlipidemia and hypertension, stage IIIb chronic kidney disease presented to the emergency room with acute onset of worsening dyspnea associated with dry cough wheezing, orthopnea and PND as well lower extremity edema for 1 week.  Patient had previous admissions with noncompliance.  She smokes cocaine and she is unfortunately homeless. At the emergency room vitals were stable.  BUN 52/creatinine 2.39.  BNP 4500.  Chest x-ray showed cardiomegaly and interstitial pulmonary edema.  Patient was given IV Lasix and admission requested for CHF exacerbation.   Assessment & Plan   Active Problems:   Hyperlipidemia   Depression   CKD (chronic kidney disease), stage III   Acute metabolic encephalopathy   History of substance abuse (HCC)   Chronic obstructive pulmonary disease (HCC)   Pulmonary hypertension (HCC)   Acute on chronic combined systolic (congestive) and diastolic (congestive) heart failure (HCC)   History of anemia due to chronic kidney disease   Prolonged QT interval   Acute CHF (congestive heart failure) (HCC)   Chest pain - Resolved. Reported on 7/31, waxing/waning over past few days, but not previously mentioned.  Seems unlikely ACS.  Troponin 210 >>199.  EKG ordered not obtained.  No further episodes.    Acute on chronic combined congestive heart failure -  Continues to be volume overloaded with pitting edema. Echocardiogram on 06/22/2019 EF 40%, grade 3 diastolic dysfunction, moderate pulmonary hypertension, severely enlarged right ventricle.  Cardiology and nephrology are following.   Lasix infusion was stopped today.  Started on oral torsemide. Net fluid balance is -12.8 L to date with over 3390cc out yesterday. Continue  Coreg and spironolactone.  Hold Entresto. Diuresis changes per cardiology and nephrology.  Currently on torsemide 40 mg PO daily.  Stable and denies any sob. O is negative 1800 ml;will cut down torsemide to 10 mg daily.  . AKI on CKD stage IIIb -AKI present on admission, likely secondary to CHF and cardiorenal syndrome.  Improving with  diuresis.  Nephrology is following.  Renal ultrasound was negative for obstruction. will follow creatinine is 1.7 now.   Hypokalemia - due to diuresis.  Replaced.  BMP to monitor, replace as needed. Resolved.  Hypotension - due to ongoing diuresis and beta blocker.  Added low dose midodrine, increase as needed.stable.  Hx of Hypertension -recently hypotensive.  Continue Coreg but hold for hypotension or bradycardia.  Hold Entresto given AKI.will resume upon discharge.  Anemia of chronic disease / iron deficiency - stable, no evidence of bleeding.  Monitor CBC.  Received IV Venofer.  Started on oral iron supplement.  Prolonged QT interval - POA with QTc 507 on EKG.  Avoid QT-prolonging agents as much as possible.  Stopped tramadol.  Monitor closely.  Maintain K>4.0 and Mg>2.0.  Started daily MagOx.  Nonsustained V-tach - has history during previous admissions as well.  Monitor on telemetry.  Maintain K>4.0 and Mg>2.0.  Bilateral foot pain - unclear if due to tense edema in her feet, or worsened neuropathy.  Already on gabapentin, will not increase given renal function.  PRN Norco or Tylenol.  Elevate legs. Resolved.    Acute metabolic encephalopathy - with confusion, intermittent agitation.  Suspect underlying psychiatric disorder.  Psych consult pending as of 7/30.resolved.  History of ventricular thrombus -  continue Xarelto.  Social - Homeless.  Severe medical issues.    APS is on board and pursuing guardianship and placement.  APS requires psychiatric evaluation, consult order has been placed.  We will have to be seen by rounding psych provider over the  weekend. Last admission, was discharged to SNF/rehab, but signed herself out.  SNF placement unlikely given ongoing drug abuse.  Patient would have to agree to substance abuse rehab.    Depression/anxiety- chronic, stable.  Evaluated by psychiatry per APS' request and given behavioral disturbances.  Does not meet criteria for inpatient psych admission.  Continue Prozac and as needed Xanax.   Obesity: Body mass index is 33 kg/m.  Complicates overall care and prognosis.  DVT prophylaxis:  rivaroxaban (XARELTO) tablet 20 mg   Diet:  Diet Orders (From admission, onward)    Start     Ordered   08/12/19 1059  Diet 2 gram sodium Room service appropriate? Yes; Fluid consistency: Thin; Fluid restriction: 1200 mL Fluid  Diet effective now       Question Answer Comment  Room service appropriate? Yes   Fluid consistency: Thin   Fluid restriction: 1200 mL Fluid      08/12/19 1059            Code Status: Full Code    Subjective 08/13/19     Pt today is alert awake but is delirious and is restless and confused. APS at bedside along with nurse and PT at bedside Requesting that wee put into writing that pt is or may not be coherent or able to take care herself. Psych consult was already done.  Nephrology following pt and creatinine improving at 1.52 currently, with rec's for renally does meds and avoid nephrotoxic meds agents and contrast. PT following pt with recommendation for Rollator walker and SNF placement for continued rehab and pt for mobility.  08/13/19 Pt seen today she is alert and awake and is to be d/c once competency and psych assessment is done. Pt is deemed incompetent per psych and d/c is per APS placement.  Patient seen this morning at bedside.  No acute events reported overnight.  She was sleeping during encounter.  Did wake up to voice but would not engage in conversation today.  She refused to work with PT yesterday.  Disposition Plan & Communication   Status is:  Inpatient  Remains inpatient appropriate because:IV treatments appropriate due to intensity of illness or inability to take PO.  Patient continues to be volume overloaded at this time, undergoing diuresis, continues to require close monitoring of renal function and electrolytes.     08/13/19 Pt seen today and is alert and awake but confused and is incompetent per psych eval.  Dispo:  Patient From: Home  (was reportedly homeless PTA)  Planned Disposition: Home (?)    Expected discharge date: 1 day  Medically stable for discharge: No    Family Communication: None at bedside, will attempt to call   Consults, Procedures, Significant Events   Consultants:   Nephrology  Cardiology  Psychiatry  Procedures:   None  Antimicrobials:   None   Objective   Vitals:   08/13/19 1229 08/13/19 1230 08/13/19 1253 08/13/19 1606  BP: 104/80 104/80 101/73 123/87  Pulse: 74 74 71 80  Resp: 18 18 18 18   Temp: 97.8 F (36.6 C) 97.8 F (36.6 C) 97.9 F (36.6 C) (!) 97.3 F (36.3 C)  TempSrc: Oral Oral Oral Oral  SpO2: 100% 100% 97% 94%  Weight:      Height:        Intake/Output Summary (Last 24 hours) at 08/13/2019 1913 Last data filed at 08/13/2019 1830 Gross per 24 hour  Intake 891.67 ml  Output 1500 ml  Net -608.33 ml   Filed Weights   08/11/19 0354 08/12/19 0506 08/13/19 0457  Weight: 90.1 kg 98 kg 95.6 kg    Physical Exam:  General exam: Sleeping comfortably, no acute distress Respiratory system: CTAB, no wheezes or rhonchi, normal respiratory effort. Cardiovascular system: normal S1/S2, RRR, 2+ pitting edema BLE's (increasing), feet still with tense edema  Gastrointestinal: Soft and nontender with positive bowel sounds   Labs   Data Reviewed: I have personally reviewed following labs and imaging studies  CBC: Recent Labs  Lab 08/09/19 0612 08/10/19 0553 08/11/19 0539 08/13/19 0930 08/13/19 1723  WBC 9.9 8.7 9.3 7.3  --   NEUTROABS  --   --   --  4.9  --    HGB 7.6* 7.6* 7.6* 7.5* 9.2*  HCT 24.1* 25.7* 25.7* 26.0* 31.3*  MCV 66.6* 69.8* 69.8* 72.6*  --   PLT 428* 490* 562* 607*  --    Basic Metabolic Panel: Recent Labs  Lab 08/09/19 0612 08/10/19 0553 08/11/19 0539 08/12/19 0628 08/13/19 0930  NA 133* 133* 135 138 139  K 3.0* 3.5 3.9 3.6 4.2  CL 93* 92* 91* 94* 95*  CO2 30 32 35* 34* 34*  GLUCOSE 112* 148* 141* 122* 151*  BUN 54* 52* 52* 48* 44*  CREATININE 1.85* 1.82* 1.51* 1.52* 1.74*  CALCIUM 8.7* 8.6* 9.1 9.3 9.7  MG 1.9 2.2 2.1 2.0 2.1   GFR: Estimated Creatinine Clearance: 39.3 mL/min (A) (by C-G formula based on SCr of 1.74 mg/dL (H)). Liver Function Tests: Recent Labs  Lab 08/13/19 0930  AST 31  ALT 18  ALKPHOS 226*  BILITOT 1.2  PROT 6.1*  ALBUMIN 2.2*   No results for input(s): LIPASE, AMYLASE in the last 168 hours. No results for input(s): AMMONIA in the last 168 hours. Coagulation Profile: No results for input(s): INR, PROTIME in the last 168 hours. Cardiac Enzymes: No results for input(s): CKTOTAL, CKMB, CKMBINDEX, TROPONINI in the last 168 hours. BNP (last 3 results) No results for input(s): PROBNP in the last 8760 hours. HbA1C: No results for input(s): HGBA1C in the last 72 hours. CBG: Recent Labs  Lab 08/10/19 2117  GLUCAP 131*   Lipid Profile: No results for input(s): CHOL, HDL, LDLCALC, TRIG, CHOLHDL, LDLDIRECT in the last 72 hours. Thyroid Function Tests: No results for input(s): TSH, T4TOTAL, FREET4, T3FREE, THYROIDAB in the last 72 hours. Anemia Panel: No results for input(s): VITAMINB12, FOLATE, FERRITIN, TIBC, IRON, RETICCTPCT in the last 72 hours. Sepsis Labs: No results for input(s): PROCALCITON, LATICACIDVEN in the last 168 hours.  No results found for this or any previous visit (from the past 240 hour(s)).    Imaging Studies   No results found.   Medications   Scheduled Meds: . aspirin EC  81 mg Oral Daily  . atorvastatin  20 mg Oral Daily  . carvedilol  3.125 mg Oral  BID WC  . dextromethorphan-guaiFENesin  1 tablet Oral BID  . ferrous sulfate  325 mg Oral Q breakfast  . FLUoxetine  20 mg Oral Daily  . gabapentin  300 mg Oral TID  . magnesium oxide  400 mg Oral Daily  . midodrine  2.5 mg Oral TID WC  . multivitamin with minerals  1 tablet Oral Daily  .  rivaroxaban  20 mg Oral Q supper  . sodium chloride flush  3 mL Intravenous Once  . sodium chloride flush  3 mL Intravenous Q12H  . torsemide  40 mg Oral Daily   Continuous Infusions: . sodium chloride Stopped (08/09/19 1233)       LOS: 17 days    Para Skeans, MD [facility] [phone] 08/13/2019, 7:13 PM    If 7PM-7AM, please contact night-coverage. How to contact the [facility] Attending or Consulting provider [place] or covering provider during after hours [place], for this patient?    1. Check the care team in [facility] and look for a) attending/consulting TR[facility] provider listed and b) the [facility] team listed 2. Log into [email or web address] and use [place]'s universal password to access. If you do not have the password, please contact the hospital operator. 3. Locate the [facility] provider you are looking for under [facility] and page to a number that you can be directly reached. 4. If you still have difficulty reaching the provider, please page the [facility] (Director on Call) for the Hospitalists listed on amion for assistance.

## 2019-08-13 NOTE — Progress Notes (Addendum)
   08/13/19 0700  Clinical Encounter Type  Visited With Patient  Visit Type Follow-up  Referral From Chaplain  Consult/Referral To Chaplain  Chaplain stopped in to check on patient. Patient said that her stomach is hurting and she needs some milk. Chaplain went to nurses' station to about milk and Network engineer said she made a call about patient's tray.  Chaplain asked patient does she go from here. Patient said that's a hard question. Chaplain asked where do you want to go? Patient said that she would like to go to a program. Chaplain reminded patient of a conversation they had last week with case manager where she started asking for cigarettes. Patient said she needs a program and being in a program will give her a bed. Patient alluded to taking classes in a program. Chaplain will reach out to case manager, asking them to visit with patient today and see if they can find a program for her.  Chaplain reached out to case manager and spoke to [place], asking her to please check in on patient and see if there is anything we can do for her, such as finding her a program to go into.

## 2019-08-13 NOTE — Progress Notes (Signed)
Nutrition Follow Up Note   DOCUMENTATION CODES:   Obesity unspecified  INTERVENTION:   D/c Ensure Max protein supplement as pt is refusing them   Add Magic cup TID with meals, each supplement provides 290 kcal and 9 grams of protein  Continue MVI daily   NUTRITION DIAGNOSIS:   Inadequate oral intake related to acute illness as evidenced by per patient/family report.  GOAL:   Patient will meet greater than or equal to 90% of their needs  -not met   MONITOR:   PO intake, Supplement acceptance, Labs, Weight trends, Skin, I & O's  ASSESSMENT:   64 year old female with known history of chronic systolic heart failure, polysubstance abuse, homelessness, recent COVID-19 infection, hyperlipidemia and hypertension, stage IIIb chronic kidney disease who is admitted with CHF   RD working remotely.  Pt continues to have decreased appetite and oral intake in hospital; pt eating <50% of meals. Pt did eat 50% of her breakfast this morning. Pt is refusing Ensure supplements. RD will discontinue Ensure and add Magic Cups to meal trays. Pt down ~25lbs since admit; pt appears to be back to her UBW. LE edema improving. UOP 3540m. May need to consider appetite stimulant if pt's oral intake does not improve.    Medications reviewed and include: aspirin, ferrous sulfate, Mg oxide, MVI, torsemide  Labs reviewed: BUN 44(H), creat 1.74(H) Hgb 7.5(L), Hct 26.0(L), MCV 72.6(L), MCH 20.9(L), MCHC 28.8(L) Iron 14(L), TIBC 230(L), ferritin 113, folate 8.8- 7/31  NUTRITION - FOCUSED PHYSICAL EXAM: Unable to perform at this time   Diet Order:   Diet Order            Diet 2 gram sodium Room service appropriate? Yes; Fluid consistency: Thin; Fluid restriction: 1200 mL Fluid  Diet effective now                EDUCATION NEEDS:   Education needs have been addressed  Skin:  Skin Assessment: Reviewed RN Assessment (ecchymosis)  Last BM:  8/3- type 5  Height:   Ht Readings from Last 1  Encounters:  07/30/19 '5\' 7"'$  (1.702 m)    Weight:   Wt Readings from Last 1 Encounters:  08/13/19 95.6 kg    Ideal Body Weight:  61.36 kg  BMI:  Body mass index is 33 kg/m.  Estimated Nutritional Needs:   Kcal:  1900-2200kcal/day  Protein:  95-110g/day  Fluid:  1.8L/day or per MD  CKoleen DistanceMS, RD, LDN Please refer to [facility] Center[facility] RD and/or RD on-call/weekend/after hours pager

## 2019-08-13 NOTE — Progress Notes (Signed)
Mobility Specialist - Progress Note   08/13/19 1229  Mobility  Activity Refused mobility  Mobility performed by Mobility specialist     Per chart review, pt receiving transfusion today. Will attempt mobility at a more appropriate time.    Kathee Delton Mobility Specialist 08/13/19, 12:31 PM

## 2019-08-14 DIAGNOSIS — I5041 Acute combined systolic (congestive) and diastolic (congestive) heart failure: Secondary | ICD-10-CM | POA: Diagnosis not present

## 2019-08-14 LAB — CBC WITH DIFFERENTIAL/PLATELET
Abs Immature Granulocytes: 0.03 10*3/uL (ref 0.00–0.07)
Basophils Absolute: 0.1 10*3/uL (ref 0.0–0.1)
Basophils Relative: 1 %
Eosinophils Absolute: 0.2 10*3/uL (ref 0.0–0.5)
Eosinophils Relative: 2 %
HCT: 30.3 % — ABNORMAL LOW (ref 36.0–46.0)
Hemoglobin: 9 g/dL — ABNORMAL LOW (ref 12.0–15.0)
Immature Granulocytes: 0 %
Lymphocytes Relative: 14 %
Lymphs Abs: 1.2 10*3/uL (ref 0.7–4.0)
MCH: 21.8 pg — ABNORMAL LOW (ref 26.0–34.0)
MCHC: 29.7 g/dL — ABNORMAL LOW (ref 30.0–36.0)
MCV: 73.4 fL — ABNORMAL LOW (ref 80.0–100.0)
Monocytes Absolute: 0.7 10*3/uL (ref 0.1–1.0)
Monocytes Relative: 9 %
Neutro Abs: 6.1 10*3/uL (ref 1.7–7.7)
Neutrophils Relative %: 74 %
Platelets: 658 10*3/uL — ABNORMAL HIGH (ref 150–400)
RBC: 4.13 MIL/uL (ref 3.87–5.11)
RDW: 22.5 % — ABNORMAL HIGH (ref 11.5–15.5)
Smear Review: INCREASED
WBC: 8.3 10*3/uL (ref 4.0–10.5)
nRBC: 0 % (ref 0.0–0.2)

## 2019-08-14 LAB — BPAM RBC
Blood Product Expiration Date: 202109042359
ISSUE DATE / TIME: 202108051221
Unit Type and Rh: 5100

## 2019-08-14 LAB — MAGNESIUM: Magnesium: 2.3 mg/dL (ref 1.7–2.4)

## 2019-08-14 LAB — TYPE AND SCREEN
ABO/RH(D): O POS
Antibody Screen: NEGATIVE
Unit division: 0

## 2019-08-14 LAB — COMPREHENSIVE METABOLIC PANEL
ALT: 17 U/L (ref 0–44)
AST: 28 U/L (ref 15–41)
Albumin: 2.5 g/dL — ABNORMAL LOW (ref 3.5–5.0)
Alkaline Phosphatase: 240 U/L — ABNORMAL HIGH (ref 38–126)
Anion gap: 11 (ref 5–15)
BUN: 41 mg/dL — ABNORMAL HIGH (ref 8–23)
CO2: 33 mmol/L — ABNORMAL HIGH (ref 22–32)
Calcium: 9.6 mg/dL (ref 8.9–10.3)
Chloride: 98 mmol/L (ref 98–111)
Creatinine, Ser: 1.61 mg/dL — ABNORMAL HIGH (ref 0.44–1.00)
GFR calc Af Amer: 39 mL/min — ABNORMAL LOW (ref >60)
GFR calc non Af Amer: 34 mL/min — ABNORMAL LOW (ref >60)
Glucose, Bld: 123 mg/dL — ABNORMAL HIGH (ref 70–99)
Potassium: 4.5 mmol/L (ref 3.5–5.1)
Sodium: 142 mmol/L (ref 135–145)
Total Bilirubin: 1.6 mg/dL — ABNORMAL HIGH (ref 0.3–1.2)
Total Protein: 6.5 g/dL (ref 6.5–8.1)

## 2019-08-14 NOTE — Progress Notes (Signed)
Per Notes CSW met with patient and Chaplain to discuss addiction rehab/treatment. Patient was unwilling to commit to any inpatient program. Repeatedly ask for a place to live instead. APS is involved, however at this time patient was found to have capacity per psych eval. Without patient commitment/agreement to treatment nothing further can be offered at this time.

## 2019-08-14 NOTE — Progress Notes (Signed)
   08/14/19 0900  Clinical Encounter Type  Visited With Patient  Visit Type Follow-up  Referral From Chaplain  Consult/Referral To Chaplain  Chaplain stopped by to see patient, but she was sleeping. Chaplain will follow up later.

## 2019-08-14 NOTE — Progress Notes (Signed)
   08/14/19 1150  Clinical Encounter Type  Visited With Patient  Visit Type Follow-up  Referral From Chaplain  Consult/Referral To Chaplain  As chaplain walked through the door, patient said "good news." Chaplain repeated, good news. Patient reached for  A piece of paper that was on the window sill. Patient read the notes from paper. Paitent said Roselyn Reef found her a place. Patient said God will give you what you want and chaplain said God will give you, patient finished the statement with "what you need." Chaplain told patient that her attitude is different today. Patient responded "a bowel movement will do it." She acknowledge becoming aware of her body. Patient said she was having discomfort and asked to be helped to the commode. She said the regular bowel movement made her feel much better. Patient talked about her grandmother, who was a little lady with hair as long as she was tall. Patient said that her grandmother was an [place]. Chaplain said, "that [name] where you get that beautiful hair from. Patient mentioned that no one could do grandmother's hair other than her oldest sister. I am not sure why patient brought up her grandmother, but thought I would mention it. Before leaving chaplain prayed with patient. Chaplain encouraged patient to be encouraged and look forward to the future and what it holds for her.

## 2019-08-14 NOTE — Plan of Care (Signed)
  Problem: Education: Goal: Knowledge of General Education information will improve Description Including pain rating scale, medication(s)/side effects and non-pharmacologic comfort measures Outcome: Progressing   

## 2019-08-14 NOTE — TOC Progression Note (Signed)
Transition of Care [facility]) - Progression Note    Patient Details  Name: Katelyn Lane MRN: [id number] Date of Birth: 1955-06-20  Transition of Care [facility]) CM/SW [place], [place] Phone Number: 08/14/2019, 8:32 AM  Clinical Narrative:    Documented in previous notes: CSW met with patient and Chaplain to discuss addiction rehab/treatment. Patient was unwilling to commit to any inpatient program. Repeatedly ask for a place to live instead. APS is involved, however at this time patient was found to have capacity per psych eval. Without patient commitment/agreement to treatment nothing further can be offered at this time.         Expected Discharge Plan and Services                                                 Social Determinants of Health (SDOH) Interventions    Readmission Risk Interventions Readmission Risk Prevention Plan 08/14/2019 07/27/2019 03/27/2019  Transportation Screening Complete Complete Complete  Medication Review Press photographer) Referral to Pharmacy - (No Data)  PCP or Specialist appointment within 3-5 days of discharge Complete Complete Complete  HRI or Home Care Consult Complete Complete Complete  SW Recovery Care/Counseling Consult Complete Complete Complete  Palliative Care Screening Not Applicable Not Applicable Not [place] Not Applicable Not Applicable Not Applicable  Some recent data might be hidden

## 2019-08-14 NOTE — Progress Notes (Signed)
PROGRESS NOTE    Katelyn Lane   YPP:[id number]  DOB: [birth year]  PCP: [name], MD    DOA: 07/26/2019 LOS: 26   Brief Narrative   64 year old female with known history of chronic systolic heart failure, recent COVID-19 infection, hyperlipidemia and hypertension, stage IIIb chronic kidney disease presented to the emergency room with acute onset of worsening dyspnea associated with dry cough wheezing, orthopnea and PND as well lower extremity edema for 1 week.  Patient had previous admissions with noncompliance.  She smokes cocaine and she is unfortunately homeless. At the emergency room vitals were stable.  BUN 52/creatinine 2.39.  BNP 4500.  Chest x-ray showed cardiomegaly and interstitial pulmonary edema.  Patient was given IV Lasix and admission requested for CHF exacerbation.   Assessment & Plan   Active Problems:   Hyperlipidemia   Depression   CKD (chronic kidney disease), stage III   Acute metabolic encephalopathy   History of substance abuse (HCC)   Chronic obstructive pulmonary disease (HCC)   Pulmonary hypertension (HCC)   Acute on chronic combined systolic (congestive) and diastolic (congestive) heart failure (HCC)   History of anemia due to chronic kidney disease   Prolonged QT interval   Acute CHF (congestive heart failure) (HCC)   Chest pain - Resolved. Reported on 7/31, waxing/waning over past few days, but not previously mentioned.  Seems unlikely ACS.  Troponin 210 >>199.  EKG ordered not obtained.  No further episodes.    Acute on chronic combined congestive heart failure -  Continues to be volume overloaded with pitting edema. Echocardiogram on 06/22/2019 EF 45%, grade 3 diastolic dysfunction, moderate pulmonary hypertension, severely enlarged right ventricle.  Cardiology and nephrology are following.   Lasix infusion was stopped today.  Started on oral torsemide. Net fluid balance is -12.8 L to date with over 3390cc out yesterday. Continue  Coreg and spironolactone.  Hold Entresto. Diuresis changes per cardiology and nephrology.  Currently on torsemide 40 mg PO daily.  Stable and denies any sob. O is negative 1800 ml;will cut down torsemide to 10 mg daily.  . AKI on CKD stage IIIb -AKI present on admission, likely secondary to CHF and cardiorenal syndrome.  Improving with  diuresis.  Nephrology is following.  Renal ultrasound was negative for obstruction. will follow creatinine is 1.67 now.   Hypokalemia - due to diuresis.  Replaced.  BMP to monitor, replace as needed. Resolved.  Hypotension - due to ongoing diuresis and beta blocker.  Added low dose midodrine, increase as needed.stable.no change in regimen if bp is low or lower than 809 systolic we will change to lasix from torsemide.   Hx of Hypertension -recently hypotensive.  Continue Coreg but hold for hypotension or bradycardia.  Hold Entresto given AKI.will resume upon discharge.  Anemia of chronic disease / iron deficiency - stable, no evidence of bleeding.  Monitor CBC.  Received IV Venofer.  Started on oral iron supplement.  Prolonged QT interval - POA with QTc 507 on EKG.  Avoid QT-prolonging agents as much as possible.  Stopped tramadol.  Monitor closely.  Maintain K>4.0 and Mg>2.0.  Started daily MagOx.  Nonsustained V-tach - has history during previous admissions as well.  Monitor on telemetry.  Maintain K>4.0 and Mg>2.0.  Bilateral foot pain - unclear if due to tense edema in her feet, or worsened neuropathy.  Already on gabapentin, will not increase given renal function.  PRN Norco or Tylenol.  Elevate legs. Resolved.    Acute metabolic encephalopathy - with  confusion, intermittent agitation.  Suspect underlying psychiatric disorder.  Psych consult pending as of 7/30.resolved.  History of ventricular thrombus -continue Xarelto.  Social - Homeless.  Severe medical issues.    APS is on board and pursuing guardianship and placement.  APS requires psychiatric  evaluation, consult order has been placed.  We will have to be seen by rounding psych provider over the weekend. Last admission, was discharged to SNF/rehab, but signed herself out.  SNF placement unlikely given ongoing drug abuse.  Patient would have to agree to substance abuse rehab.    Depression/anxiety- chronic, stable.  Evaluated by psychiatry per APS' request and given behavioral disturbances.  Does not meet criteria for inpatient psych admission.  Continue Prozac and as needed Xanax.   Obesity: Body mass index is 30.89 kg/m.  Complicates overall care and prognosis.  DVT prophylaxis:  rivaroxaban (XARELTO) tablet 20 mg   Diet:  Diet Orders (From admission, onward)    Start     Ordered   08/12/19 1059  Diet 2 gram sodium Room service appropriate? Yes; Fluid consistency: Thin; Fluid restriction: 1200 mL Fluid  Diet effective now       Question Answer Comment  Room service appropriate? Yes   Fluid consistency: Thin   Fluid restriction: 1200 mL Fluid      08/12/19 1059            Code Status: Full Code    Subjective 08/14/19     Pt today is alert awake but is delirious and is restless and confused. APS at bedside along with nurse and PT at bedside Requesting that wee put into writing that pt is or may not be coherent or able to take care herself. Psych consult was already done.  Nephrology following pt and creatinine improving at 1.52 currently, with rec's for renally does meds and avoid nephrotoxic meds agents and contrast. PT following pt with recommendation for Rollator walker and SNF placement for continued rehab and pt for mobility.  08/13/19 Pt seen today she is alert and awake and is to be d/c once competency and psych assessment is done. Pt is deemed incompetent per psych and d/c is per APS placement.  Patient seen this morning at bedside.  No acute events reported overnight.  She was sleeping during encounter.  Did wake up to voice but would not engage in conversation  today.  She refused to work with PT yesterday.  08/14/19 Pt is alert and awake was very happy she worked well with PT.  Per LCSW note no d/c disposition can be offered due to lack of competency issue.   Disposition Plan & Communication   Status is: Inpatient  Remains inpatient appropriate because:IV treatments appropriate due to intensity of illness or inability to take PO.  Patient continues to be volume overloaded at this time, undergoing diuresis, continues to require close monitoring of renal function and electrolytes.     08/13/19 Pt seen today and is alert and awake but confused and is incompetent per psych eval.  Dispo:  Patient From: Home  (was reportedly homeless PTA)  Planned Disposition: Home (?)    Expected discharge date: 1 day  Medically stable for discharge: No  Family Communication: None at bedside, will attempt to call   Consults, Procedures, Significant Events   Consultants:   Nephrology  Cardiology  Psychiatry  Procedures:   None  Antimicrobials:   None   Objective   Vitals:   08/14/19 0418 08/14/19 1204 08/14/19 1205 08/14/19 1720  BP: 123/86 (!) 89/74 91/67 113/79  Pulse: 90 68 66 78  Resp: 20 19  19   Temp: 97.9 F (36.6 C)   97.7 F (36.5 C)  TempSrc: Oral   Oral  SpO2: 93% 100%  100%  Weight: 89.4 kg     Height:        Intake/Output Summary (Last 24 hours) at 08/14/2019 1838 Last data filed at 08/14/2019 0040 Gross per 24 hour  Intake 243 ml  Output 502 ml  Net -259 ml   Filed Weights   08/12/19 0506 08/13/19 0457 08/14/19 0418  Weight: 98 kg 95.6 kg 89.4 kg    Physical Exam: Blood pressure 113/79, pulse 78, temperature 97.7 F (36.5 C), temperature source Oral, resp. rate 19, height 5\' 7"  (1.702 m), weight 89.4 kg, SpO2 100 %.   General exam: Sleeping comfortably, no acute distress Respiratory system: CTAB, no wheezes or rhonchi, normal respiratory effort. Cardiovascular system: normal S1/S2, RRR, 2+ pitting edema BLE's  (increasing), feet still with tense edema  Gastrointestinal: Soft and nontender with positive bowel sounds   Labs   Data Reviewed: I have personally reviewed following labs and imaging studies  CBC: Recent Labs  Lab 08/09/19 0612 08/09/19 0612 08/10/19 0553 08/11/19 0539 08/13/19 0930 08/13/19 1723 08/14/19 0551  WBC 9.9  --  8.7 9.3 7.3  --  8.3  NEUTROABS  --   --   --   --  4.9  --  6.1  HGB 7.6*   < > 7.6* 7.6* 7.5* 9.2* 9.0*  HCT 24.1*   < > 25.7* 25.7* 26.0* 31.3* 30.3*  MCV 66.6*  --  69.8* 69.8* 72.6*  --  73.4*  PLT 428*  --  490* 562* 607*  --  658*   < > = values in this interval not displayed.   Basic Metabolic Panel: Recent Labs  Lab 08/10/19 0553 08/11/19 0539 08/12/19 0628 08/13/19 0930 08/14/19 0551  NA 133* 135 138 139 142  K 3.5 3.9 3.6 4.2 4.5  CL 92* 91* 94* 95* 98  CO2 32 35* 34* 34* 33*  GLUCOSE 148* 141* 122* 151* 123*  BUN 52* 52* 48* 44* 41*  CREATININE 1.82* 1.51* 1.52* 1.74* 1.61*  CALCIUM 8.6* 9.1 9.3 9.7 9.6  MG 2.2 2.1 2.0 2.1 2.3   GFR: Estimated Creatinine Clearance: 41 mL/min (A) (by C-G formula based on SCr of 1.61 mg/dL (H)). Liver Function Tests: Recent Labs  Lab 08/13/19 0930 08/14/19 0551  AST 31 28  ALT 18 17  ALKPHOS 226* 240*  BILITOT 1.2 1.6*  PROT 6.1* 6.5  ALBUMIN 2.2* 2.5*   No results for input(s): LIPASE, AMYLASE in the last 168 hours. No results for input(s): AMMONIA in the last 168 hours. Coagulation Profile: No results for input(s): INR, PROTIME in the last 168 hours. Cardiac Enzymes: No results for input(s): CKTOTAL, CKMB, CKMBINDEX, TROPONINI in the last 168 hours. BNP (last 3 results) No results for input(s): PROBNP in the last 8760 hours. HbA1C: No results for input(s): HGBA1C in the last 72 hours. CBG: Recent Labs  Lab 08/10/19 2117  GLUCAP 131*   Lipid Profile: No results for input(s): CHOL, HDL, LDLCALC, TRIG, CHOLHDL, LDLDIRECT in the last 72 hours. Thyroid Function Tests: No results  for input(s): TSH, T4TOTAL, FREET4, T3FREE, THYROIDAB in the last 72 hours. Anemia Panel: No results for input(s): VITAMINB12, FOLATE, FERRITIN, TIBC, IRON, RETICCTPCT in the last 72 hours. Sepsis Labs: No results for input(s): PROCALCITON, LATICACIDVEN in the last  168 hours.  No results found for this or any previous visit (from the past 240 hour(s)).    Imaging Studies   No results found.   Medications   Scheduled Meds: . aspirin EC  81 mg Oral Daily  . atorvastatin  20 mg Oral Daily  . carvedilol  3.125 mg Oral BID WC  . dextromethorphan-guaiFENesin  1 tablet Oral BID  . ferrous sulfate  325 mg Oral Q breakfast  . FLUoxetine  20 mg Oral Daily  . gabapentin  300 mg Oral TID  . magnesium oxide  400 mg Oral Daily  . midodrine  2.5 mg Oral TID WC  . multivitamin with minerals  1 tablet Oral Daily  . rivaroxaban  20 mg Oral Q supper  . sodium chloride flush  3 mL Intravenous Once  . sodium chloride flush  3 mL Intravenous Q12H  . torsemide  10 mg Oral Daily   Continuous Infusions: . sodium chloride Stopped (08/09/19 1233)       LOS: 18 days    Para Skeans, MD [facility] [phone] 08/14/2019, 6:38 PM    If 7PM-7AM, please contact night-coverage. How to contact the [facility] Attending or Consulting provider [place] or covering provider during after hours [place], for this patient?    1. Check the care team in [facility] and look for a) attending/consulting [facility] provider listed and b) the [facility] team listed 2. Log into [email or web address] and use 's universal password to access. If you do not have the password, please contact the hospital operator. 3. Locate the [facility] provider you are looking for under [facility] and page to a number that you can be directly reached. 4. If you still have difficulty reaching the provider, please page the [facility] (Director on Call) for the Hospitalists listed on amion for assistance.

## 2019-08-14 NOTE — Progress Notes (Signed)
Physical Therapy Treatment Patient Details Name: Katelyn Lane MRN: [id number] DOB: 15-Feb-1955 Today's Date: 08/14/2019    History of Present Illness 64 year old female with known history of chronic systolic heart failure, recent COVID-19 infection, hyperlipidemia and hypertension, stage IIIb chronic kidney disease presented to the emergency room 07/27/2019 with acute onset of worsening dyspnea associated with dry cough wheezing, orthopnea and PND as well lower extremity edema for 1 week.  Patient had previous admissions with noncompliance.  She smokes cocaine and she is unfortunately homeless. Admitted for acute on chronic systolic and diastolic CHF and AKI on sage IIIb CKD. The patient had a 2D echo on 06/22/2019 that revealed an EF of 25 to 30% with grade 3 diastolic dysfunction, severe left atrial and right atrial dilatation as well as mild mitral regurgitation. Chest x-ray showed cardiomegaly and interstitial pulmonary edema.    PT Comments    Pt was seated on BSC upon arriving. Agrees to PT session and is willing to trial ambulation. +2 assist in room throughout for safety. She stood to RW with min assist and ambulated ~ 15 ft without LOB or unsteadiness. Overall pt demonstrates improve abilities this date but would benefit from SNF at DC to address deficits with safe functional mobility. If she continues to improve with safety and mobility may be able to DC with supervision + HH. Pt was seated in recliner, chair alarm in place, and call bell in reach.RN notified of pt's abilities.     Follow Up Recommendations  SNF     Equipment Recommendations  Rolling walker with 5" wheels;3in1 (PT);Wheelchair (measurements PT)    Recommendations for Other Services       Precautions / Restrictions Precautions Precautions: Fall Precaution Comments: High fall Restrictions Weight Bearing Restrictions: No    Mobility  Bed Mobility               General bed mobility comments: pt was seated on  BSC upon arriving and in recliner post session  Transfers Overall transfer level: Needs assistance Equipment used: Rolling walker (2 wheeled) Transfers: Sit to/from Stand Sit to Stand: +2 safety/equipment;Min assist         General transfer comment: min assist to stand from [facility]. 2nd person for safety.   Ambulation/Gait Ambulation/Gait assistance: Min guard;+2 safety/equipment;Min assist Gait Distance (Feet): 15 Feet Assistive device: Rolling walker (2 wheeled) Gait Pattern/deviations: Step-through pattern;Trunk flexed Gait velocity: deceased   General Gait Details: pt was able to ambulate in room 15 ft without unsteadiness or LOB. 2nd person SBA for safety.    Stairs             Wheelchair Mobility    Modified Rankin (Stroke Patients Only)       Balance Overall balance assessment: Needs assistance Sitting-balance support: No upper extremity supported;Feet supported Sitting balance-Leahy Scale: Normal     Standing balance support: During functional activity;Bilateral upper extremity supported Standing balance-Leahy Scale: Fair Standing balance comment: no LOB however pt is impulsive and high fall risk                             Cognition Arousal/Alertness: Awake/alert Behavior During Therapy: WFL for tasks assessed/performed;Impulsive (slightly impulsive at times) Overall Cognitive Status: History of cognitive impairments - at baseline                                 General Comments: pt  was alert and able to follow commands however has cognition deficits. she was able to follwo commands throughout without difficulty      Exercises      General Comments        Pertinent Vitals/Pain Pain Assessment: No/denies pain Pain Score: 0-No pain Faces Pain Scale: Hurts a little bit Pain Location: B feet when wt bearing Pain Descriptors / Indicators: Grimacing;Guarding Pain Intervention(s): Limited activity within patient's  tolerance;Monitored during session;Premedicated before session    Home Living                      Prior Function            PT Goals (current goals can now be found in the care plan section) Acute Rehab PT Goals Patient Stated Goal: to get better Progress towards PT goals: Progressing toward goals    Frequency    Min 2X/week      PT Plan Current plan remains appropriate    Co-evaluation              AM-PAC PT "6 Clicks" Mobility   Outcome Measure  Help needed turning from your back to your side while in a flat bed without using bedrails?: A Little Help needed moving from lying on your back to sitting on the side of a flat bed without using bedrails?: A Little Help needed moving to and from a bed to a chair (including a wheelchair)?: A Little Help needed standing up from a chair using your arms (e.g., wheelchair or bedside chair)?: A Little Help needed to walk in hospital room?: A Lot Help needed climbing 3-5 steps with a railing? : A Lot 6 Click Score: 16    End of Session Equipment Utilized During Treatment: Gait belt Activity Tolerance: Patient tolerated treatment well Patient left: in chair;with call bell/phone within reach;with chair alarm set Nurse Communication: Mobility status PT Visit Diagnosis: Unsteadiness on feet (R26.81);Muscle weakness (generalized) (M62.81);Difficulty in walking, not elsewhere classified (R26.2)     Time: 1015-1040 PT Time Calculation (min) (ACUTE ONLY): 25 min  Charges:  $Gait Training: 8-22 mins $Therapeutic Activity: 8-22 mins                     Julaine Fusi PTA 08/14/19, 12:38 PM

## 2019-08-15 ENCOUNTER — Inpatient Hospital Stay: Payer: Medicaid Other

## 2019-08-15 ENCOUNTER — Encounter: Payer: Self-pay | Admitting: Family Medicine

## 2019-08-15 DIAGNOSIS — I472 Ventricular tachycardia: Secondary | ICD-10-CM

## 2019-08-15 DIAGNOSIS — I5043 Acute on chronic combined systolic (congestive) and diastolic (congestive) heart failure: Secondary | ICD-10-CM | POA: Diagnosis not present

## 2019-08-15 DIAGNOSIS — G9341 Metabolic encephalopathy: Secondary | ICD-10-CM | POA: Diagnosis not present

## 2019-08-15 LAB — T4, FREE: Free T4: 1.3 ng/dL — ABNORMAL HIGH (ref 0.61–1.12)

## 2019-08-15 LAB — CBC WITH DIFFERENTIAL/PLATELET
Abs Immature Granulocytes: 0.02 10*3/uL (ref 0.00–0.07)
Basophils Absolute: 0.1 10*3/uL (ref 0.0–0.1)
Basophils Relative: 1 %
Eosinophils Absolute: 0.3 10*3/uL (ref 0.0–0.5)
Eosinophils Relative: 4 %
HCT: 32.6 % — ABNORMAL LOW (ref 36.0–46.0)
HCT: 33.7 % — ABNORMAL LOW (ref 36.0–46.0)
Hemoglobin: 9.5 g/dL — ABNORMAL LOW (ref 12.0–15.0)
Hemoglobin: 10 g/dL — ABNORMAL LOW (ref 12.0–15.0)
Immature Granulocytes: 0 %
Lymphocytes Relative: 19 %
Lymphs Abs: 1.3 10*3/uL (ref 0.7–4.0)
MCH: 21.7 pg — ABNORMAL LOW (ref 26.0–34.0)
MCH: 22 pg — ABNORMAL LOW (ref 26.0–34.0)
MCHC: 29.1 g/dL — ABNORMAL LOW (ref 30.0–36.0)
MCHC: 29.7 g/dL — ABNORMAL LOW (ref 30.0–36.0)
MCV: 74.6 fL — ABNORMAL LOW (ref 80.0–100.0)
MCV: 74.2 fL — ABNORMAL LOW (ref 80.0–100.0)
Monocytes Absolute: 0.5 10*3/uL (ref 0.1–1.0)
Monocytes Relative: 7 %
Neutro Abs: 5 10*3/uL (ref 1.7–7.7)
Neutrophils Relative %: 69 %
Platelets: 639 10*3/uL — ABNORMAL HIGH (ref 150–400)
Platelets: 685 10*3/uL — ABNORMAL HIGH (ref 150–400)
RBC: 4.37 MIL/uL (ref 3.87–5.11)
RBC: 4.54 MIL/uL (ref 3.87–5.11)
RDW: 24.1 % — ABNORMAL HIGH (ref 11.5–15.5)
RDW: 24.5 % — ABNORMAL HIGH (ref 11.5–15.5)
WBC: 6.9 10*3/uL (ref 4.0–10.5)
WBC: 7.2 10*3/uL (ref 4.0–10.5)
nRBC: 0 % (ref 0.0–0.2)
nRBC: 0 % (ref 0.0–0.2)

## 2019-08-15 LAB — PHOSPHORUS: Phosphorus: 3.7 mg/dL (ref 2.5–4.6)

## 2019-08-15 LAB — COMPREHENSIVE METABOLIC PANEL
ALT: 20 U/L (ref 0–44)
AST: 34 U/L (ref 15–41)
Albumin: 2.6 g/dL — ABNORMAL LOW (ref 3.5–5.0)
Alkaline Phosphatase: 260 U/L — ABNORMAL HIGH (ref 38–126)
Anion gap: 15 (ref 5–15)
BUN: 40 mg/dL — ABNORMAL HIGH (ref 8–23)
CO2: 28 mmol/L (ref 22–32)
Calcium: 9.2 mg/dL (ref 8.9–10.3)
Chloride: 94 mmol/L — ABNORMAL LOW (ref 98–111)
Creatinine, Ser: 1.78 mg/dL — ABNORMAL HIGH (ref 0.44–1.00)
GFR calc Af Amer: 35 mL/min — ABNORMAL LOW (ref >60)
GFR calc non Af Amer: 30 mL/min — ABNORMAL LOW (ref >60)
Glucose, Bld: 89 mg/dL (ref 70–99)
Potassium: 4.2 mmol/L (ref 3.5–5.1)
Sodium: 137 mmol/L (ref 135–145)
Total Bilirubin: 1.3 mg/dL — ABNORMAL HIGH (ref 0.3–1.2)
Total Protein: 6.9 g/dL (ref 6.5–8.1)

## 2019-08-15 LAB — TROPONIN I (HIGH SENSITIVITY)
Troponin I (High Sensitivity): 165 ng/L (ref <18)
Troponin I (High Sensitivity): 184 ng/L (ref <18)
Troponin I (High Sensitivity): 159 ng/L (ref <18)

## 2019-08-15 LAB — LACTIC ACID, PLASMA
Lactic Acid, Venous: 2.3 mmol/L (ref 0.5–1.9)
Lactic Acid, Venous: 1.8 mmol/L (ref 0.5–1.9)
Lactic Acid, Venous: 1.4 mmol/L (ref 0.5–1.9)

## 2019-08-15 LAB — FIBRIN DERIVATIVES D-DIMER (ARMC ONLY): Fibrin derivatives D-dimer (ARMC): 4900.61 ng/mL (FEU) — ABNORMAL HIGH (ref 0.00–499.00)

## 2019-08-15 LAB — MAGNESIUM: Magnesium: 2.3 mg/dL (ref 1.7–2.4)

## 2019-08-15 LAB — TSH: TSH: 6.83 u[IU]/mL — ABNORMAL HIGH (ref 0.350–4.500)

## 2019-08-15 MED ORDER — SODIUM CHLORIDE 0.9 % IV SOLN: 250.0000 mL | INTRAVENOUS | Status: DC | PRN

## 2019-08-15 MED ORDER — SODIUM CHLORIDE 0.9 % IV SOLN: INTRAVENOUS | Status: AC

## 2019-08-15 MED ORDER — MORPHINE SULFATE (PF) 2 MG/ML IV SOLN
2.0000 mg | Freq: Four times a day (QID) | INTRAVENOUS | Status: DC | PRN
  Filled 2019-08-15: qty 1

## 2019-08-15 MED ORDER — IOHEXOL 350 MG/ML SOLN
80.0000 mL | Freq: Once | INTRAVENOUS | Status: AC | PRN
  Administered 2019-08-15: 80 mL via INTRAVENOUS

## 2019-08-15 NOTE — Progress Notes (Signed)
PROGRESS NOTE    Katelyn Lane   XAJ:[id number]  DOB: 04-10-1955  PCP: [name], MD    DOA: 07/26/2019 LOS: 72   Brief Narrative   64 year old female with known history of chronic systolic heart failure, recent COVID-19 infection, hyperlipidemia and hypertension, stage IIIb chronic kidney disease presented to the emergency room with acute onset of worsening dyspnea associated with dry cough wheezing, orthopnea and PND as well lower extremity edema for 1 week.  Patient had previous admissions with noncompliance.  She smokes cocaine and she is unfortunately homeless. At the emergency room vitals were stable.  BUN 52/creatinine 2.39.  BNP 4500.  Chest x-ray showed cardiomegaly and interstitial pulmonary edema.  Patient was given IV Lasix and admission requested for CHF exacerbation.   Assessment & Plan   Active Problems:   Hyperlipidemia   Depression   CKD (chronic kidney disease), stage III   Acute metabolic encephalopathy   History of substance abuse (HCC)   Chronic obstructive pulmonary disease (HCC)   Pulmonary hypertension (HCC)   Acute on chronic combined systolic (congestive) and diastolic (congestive) heart failure (HCC)   History of anemia due to chronic kidney disease   Prolonged QT interval   Acute CHF (congestive heart failure) (HCC)   Chest pain - Resolved. Reported on 7/31, waxing/waning over past few days, but not previously mentioned.  Seems unlikely ACS.  Troponin 210 >>199.  EKG ordered not obtained.  No further episodes.    Acute on chronic combined congestive heart failure -  Continues to be volume overloaded with pitting edema. Echocardiogram on 06/22/2019 EF 09%, grade 3 diastolic dysfunction, moderate pulmonary hypertension, severely enlarged right ventricle.  Cardiology and nephrology are following.   Lasix infusion was stopped today.  Started on oral torsemide. Net fluid balance is -12.8 L to date with over 3390cc out yesterday. Continue  Coreg and spironolactone.  Hold Entresto. Diuresis changes per cardiology and nephrology.  Currently on torsemide 40 mg PO daily.  Stable and denies any sob. O is negative 1800 ml;will cut down torsemide to 10 mg daily.  . AKI on CKD stage IIIb -AKI present on admission, likely secondary to CHF and cardiorenal syndrome.  Improving with  diuresis.  Nephrology is following.  Renal ultrasound was negative for obstruction. will follow creatinine is 1.67 now.   Hypokalemia - due to diuresis.  Replaced.  BMP to monitor, replace as needed. Resolved.  Hypotension - due to ongoing diuresis and beta blocker.  Added low dose midodrine, increase as needed.stable.no change in regimen if bp is low or lower than 470 systolic we will change to lasix from torsemide.   Hx of Hypertension -recently hypotensive.  Continue Coreg but hold for hypotension or bradycardia.  Hold Entresto given AKI.will resume upon discharge.  Anemia of chronic disease / iron deficiency - stable, no evidence of bleeding.  Monitor CBC.  Received IV Venofer.  Started on oral iron supplement.h/h stable  Prolonged QT interval - POA with QTc 507 on EKG.  Avoid QT-prolonging agents as much as possible.  Stopped tramadol.  Monitor closely.  Maintain K>4.0 and Mg>2.0.  Started daily MagOx.  Nonsustained V-tach - has history during previous admissions as well.  Monitor on telemetry.  Maintain K>4.0 and Mg>2.0.will follow labs.   Bilateral foot pain - unclear if due to tense edema in her feet, or worsened neuropathy.  Already on gabapentin, will not increase given renal function.  PRN Norco or Tylenol.  Elevate legs. Resolved.    Acute  metabolic encephalopathy - with confusion, intermittent agitation.  Suspect underlying psychiatric disorder.  Psych consult pending as of 7/30.resolved.  History of ventricular thrombus -continue Xarelto.  Social - Homeless.  Severe medical issues.    APS is on board and pursuing guardianship and placement.   APS requires psychiatric evaluation, consult order has been placed.  We will have to be seen by rounding psych provider over the weekend. Last admission, was discharged to SNF/rehab, but signed herself out.  SNF placement unlikely given ongoing drug abuse.  Patient would have to agree to substance abuse rehab.    Depression/anxiety- chronic, stable.  Evaluated by psychiatry per APS' request and given behavioral disturbances.  Does not meet criteria for inpatient psych admission.  Continue Prozac and as needed Xanax.   Obesity: Body mass index is 30.89 kg/m.  Complicates overall care and prognosis.  DVT prophylaxis:  rivaroxaban (XARELTO) tablet 20 mg   Diet:  Diet Orders (From admission, onward)    Start     Ordered   08/12/19 1059  Diet 2 gram sodium Room service appropriate? Yes; Fluid consistency: Thin; Fluid restriction: 1200 mL Fluid  Diet effective now       Question Answer Comment  Room service appropriate? Yes   Fluid consistency: Thin   Fluid restriction: 1200 mL Fluid      08/12/19 1059            Code Status: Full Code    Subjective 08/15/19     Pt today is alert awake but is delirious and is restless and confused. APS at bedside along with nurse and PT at bedside Requesting that wee put into writing that pt is or may not be coherent or able to take care herself. Psych consult was already done.  Nephrology following pt and creatinine improving at 1.52 currently, with rec's for renally does meds and avoid nephrotoxic meds agents and contrast. PT following pt with recommendation for Rollator walker and SNF placement for continued rehab and pt for mobility.  08/13/19 Pt seen today she is alert and awake and is to be d/c once competency and psych assessment is done. Pt is deemed incompetent per psych and d/c is per APS placement.  Patient seen this morning at bedside.  No acute events reported overnight.  She was sleeping during encounter.  Did wake up to voice but would not  engage in conversation today.  She refused to work with PT yesterday.  08/14/19 Pt is alert and awake was very happy she worked well with PT.  Per LCSW note no d/c disposition can be offered due to lack of competency issue.   08/15/19 Pt is eating breakfast and feels well. Is waiting placement and aps is involved   Disposition Plan & Communication   Status is: Inpatient  Remains inpatient appropriate because:IV treatments appropriate due to intensity of illness or inability to take PO.  Patient continues to be volume overloaded at this time, undergoing diuresis, continues to require close monitoring of renal function and electrolytes.     Dispo:  Patient From: Home  (was reportedly homeless PTA)  Planned Disposition: Home (?)    Expected discharge date: 1 day  Medically stable for discharge: No  Family Communication: None at bedside, will attempt to call   Consults, Procedures, Significant Events   Consultants:   Nephrology  Cardiology  Psychiatry  Procedures:   None  Antimicrobials:   None   Objective   Vitals:   08/15/19 0536 08/15/19 0756 08/15/19  1138 08/15/19 1558  BP: 112/90 (!) 121/96 102/73 100/81  Pulse: 83 79 82 69  Resp:  19 19 18   Temp: 97.7 F (36.5 C) 98.6 F (37 C) 97.8 F (36.6 C) (!) 97.4 F (36.3 C)  TempSrc: Oral  Oral Oral  SpO2: 94% 100% 99% 100%  Weight: 89.4 kg     Height:        Intake/Output Summary (Last 24 hours) at 08/15/2019 1657 Last data filed at 08/15/2019 1100 Gross per 24 hour  Intake 300 ml  Output 3 ml  Net 297 ml   Filed Weights   08/13/19 0457 08/14/19 0418 08/15/19 0536  Weight: 95.6 kg 89.4 kg 89.4 kg    Physical Exam: Blood pressure 100/81, pulse 69, temperature (!) 97.4 F (36.3 C), temperature source Oral, resp. rate 18, height 5\' 7"  (1.702 m), weight 89.4 kg, SpO2 100 %.   General exam: Sleeping comfortably, no acute distress Respiratory system: CTAB, no wheezes or rhonchi, normal respiratory  effort. Cardiovascular system: normal S1/S2, RRR, 2+ pitting edema BLE's (increasing), feet still with tense edema  Gastrointestinal: Soft and nontender with positive bowel sounds   Labs   Data Reviewed: I have personally reviewed following labs and imaging studies  CBC: Recent Labs  Lab 08/09/19 0612 08/09/19 0612 08/10/19 0553 08/11/19 0539 08/13/19 0930 08/13/19 1723 08/14/19 0551  WBC 9.9  --  8.7 9.3 7.3  --  8.3  NEUTROABS  --   --   --   --  4.9  --  6.1  HGB 7.6*   < > 7.6* 7.6* 7.5* 9.2* 9.0*  HCT 24.1*   < > 25.7* 25.7* 26.0* 31.3* 30.3*  MCV 66.6*  --  69.8* 69.8* 72.6*  --  73.4*  PLT 428*  --  490* 562* 607*  --  658*   < > = values in this interval not displayed.   Basic Metabolic Panel: Recent Labs  Lab 08/10/19 0553 08/11/19 0539 08/12/19 0628 08/13/19 0930 08/14/19 0551  NA 133* 135 138 139 142  K 3.5 3.9 3.6 4.2 4.5  CL 92* 91* 94* 95* 98  CO2 32 35* 34* 34* 33*  GLUCOSE 148* 141* 122* 151* 123*  BUN 52* 52* 48* 44* 41*  CREATININE 1.82* 1.51* 1.52* 1.74* 1.61*  CALCIUM 8.6* 9.1 9.3 9.7 9.6  MG 2.2 2.1 2.0 2.1 2.3   GFR: Estimated Creatinine Clearance: 41 mL/min (A) (by C-G formula based on SCr of 1.61 mg/dL (H)). Liver Function Tests: Recent Labs  Lab 08/13/19 0930 08/14/19 0551  AST 31 28  ALT 18 17  ALKPHOS 226* 240*  BILITOT 1.2 1.6*  PROT 6.1* 6.5  ALBUMIN 2.2* 2.5*   No results for input(s): LIPASE, AMYLASE in the last 168 hours. No results for input(s): AMMONIA in the last 168 hours. Coagulation Profile: No results for input(s): INR, PROTIME in the last 168 hours. Cardiac Enzymes: No results for input(s): CKTOTAL, CKMB, CKMBINDEX, TROPONINI in the last 168 hours. BNP (last 3 results) No results for input(s): PROBNP in the last 8760 hours. HbA1C: No results for input(s): HGBA1C in the last 72 hours. CBG: Recent Labs  Lab 08/10/19 2117  GLUCAP 131*   Lipid Profile: No results for input(s): CHOL, HDL, LDLCALC, TRIG,  CHOLHDL, LDLDIRECT in the last 72 hours. Thyroid Function Tests: No results for input(s): TSH, T4TOTAL, FREET4, T3FREE, THYROIDAB in the last 72 hours. Anemia Panel: No results for input(s): VITAMINB12, FOLATE, FERRITIN, TIBC, IRON, RETICCTPCT in the last 72  hours. Sepsis Labs: No results for input(s): PROCALCITON, LATICACIDVEN in the last 168 hours.  No results found for this or any previous visit (from the past 240 hour(s)).    Imaging Studies   No results found.   Medications   Scheduled Meds: . aspirin EC  81 mg Oral Daily  . atorvastatin  20 mg Oral Daily  . carvedilol  3.125 mg Oral BID WC  . dextromethorphan-guaiFENesin  1 tablet Oral BID  . ferrous sulfate  325 mg Oral Q breakfast  . FLUoxetine  20 mg Oral Daily  . gabapentin  300 mg Oral TID  . magnesium oxide  400 mg Oral Daily  . midodrine  2.5 mg Oral TID WC  . multivitamin with minerals  1 tablet Oral Daily  . rivaroxaban  20 mg Oral Q supper  . sodium chloride flush  3 mL Intravenous Once  . sodium chloride flush  3 mL Intravenous Q12H  . torsemide  10 mg Oral Daily   Continuous Infusions: . sodium chloride Stopped (08/09/19 1233)       LOS: 19 days    Para Skeans, MD [facility] [phone] 08/15/2019, 4:57 PM    If 7PM-7AM, please contact night-coverage. How to contact the [facility] Attending or Consulting provider [place] or covering provider during after hours [place], for this patient?    1. Check the care team in [facility] and look for a) attending/consulting [facility] provider listed and b) the [facility] team listed 2. Log into [email or web address] and use [place]'s universal password to access. If you do not have the password, please contact the hospital operator. 3. Locate the [facility] provider you are looking for under [facility] and page to a number that you can be directly reached. 4. If you still have difficulty reaching the provider, please page the [facility] (Director on Call) for the Hospitalists listed on  amion for assistance.

## 2019-08-15 NOTE — Plan of Care (Signed)
  Problem: Education: Goal: Knowledge of General Education information will improve Description Including pain rating scale, medication(s)/side effects and non-pharmacologic comfort measures Outcome: Progressing   

## 2019-08-15 NOTE — Progress Notes (Signed)
Patient had 20 then 6 beat run of vtach. Patient sitting on edge of bed making sentences that did not make sense. This RN notified MD. MD arrived at bedside and patient was able to speak in full sentences. Patient suddenly became short of breath. VSS, 2L of 02 placed via [place]. Will administer PRN morphine for shortness of breath. New orders placed for chest and abdominal CT and EKG.

## 2019-08-15 NOTE — Progress Notes (Signed)
CRITICAL VALUE ALERT  Critical Value: lactic 2.3  Date & Time Notied:  08/15/19 18:45  Provider Notified: Posey Pronto  Orders Received/Actions taken: no new orders at this time

## 2019-08-15 NOTE — Progress Notes (Signed)
[facility]  ROUNDING NOTE   Subjective:   Creatinine 1.61 (1.74) Midodrine  Torsemide 10mg  PO daily  No complaints  Objective:  Vital signs in last 24 hours:  Temp:  [97.7 F (36.5 C)-98.6 F (37 C)] 98.6 F (37 C) (08/07 0756) Pulse Rate:  [66-83] 79 (08/07 0756) Resp:  [19] 19 (08/07 0756) BP: (89-121)/(67-96) 121/96 (08/07 0756) SpO2:  [94 %-100 %] 100 % (08/07 0756) Weight:  [89.4 kg] 89.4 kg (08/07 0536)  Weight change: 0 kg Filed Weights   08/13/19 0457 08/14/19 0418 08/15/19 0536  Weight: 95.6 kg 89.4 kg 89.4 kg    Intake/Output: I/O last 3 completed shifts: In: 543 [P.O.:540; I.V.:3] Out: 504 [Urine:502; Stool:2]   Intake/Output this shift:  No intake/output data recorded.  Physical Exam: General: Laying in bed, NAD   Head: Normocephalic, atraumatic. Moist oral mucosal membranes  Eyes: Anicteric  Lungs:  Clear bilateral, normal effort  Heart: Regular rate and rhythm  Abdomen:  Soft, nontender, BS present  Extremities: + peripheral edema.  Neurologic: Nonfocal, moving all four extremities  Skin: No lesions        Basic Metabolic Panel: Recent Labs  Lab 08/10/19 0553 08/10/19 0553 08/11/19 0539 08/11/19 0539 08/12/19 0628 08/13/19 0930 08/14/19 0551  NA 133*  --  135  --  138 139 142  K 3.5  --  3.9  --  3.6 4.2 4.5  CL 92*  --  91*  --  94* 95* 98  CO2 32  --  35*  --  34* 34* 33*  GLUCOSE 148*  --  141*  --  122* 151* 123*  BUN 52*  --  52*  --  48* 44* 41*  CREATININE 1.82*  --  1.51*  --  1.52* 1.74* 1.61*  CALCIUM 8.6*   < > 9.1   < > 9.3 9.7 9.6  MG 2.2  --  2.1  --  2.0 2.1 2.3   < > = values in this interval not displayed.    Liver Function Tests: Recent Labs  Lab 08/13/19 0930 08/14/19 0551  AST 31 28  ALT 18 17  ALKPHOS 226* 240*  BILITOT 1.2 1.6*  PROT 6.1* 6.5  ALBUMIN 2.2* 2.5*   No results for input(s): LIPASE, AMYLASE in the last 168 hours. No results for input(s): AMMONIA in the last 168  hours.  CBC: Recent Labs  Lab 08/09/19 0612 08/09/19 0612 08/10/19 0553 08/11/19 0539 08/13/19 0930 08/13/19 1723 08/14/19 0551  WBC 9.9  --  8.7 9.3 7.3  --  8.3  NEUTROABS  --   --   --   --  4.9  --  6.1  HGB 7.6*   < > 7.6* 7.6* 7.5* 9.2* 9.0*  HCT 24.1*   < > 25.7* 25.7* 26.0* 31.3* 30.3*  MCV 66.6*  --  69.8* 69.8* 72.6*  --  73.4*  PLT 428*  --  490* 562* 607*  --  658*   < > = values in this interval not displayed.    Cardiac Enzymes: No results for input(s): CKTOTAL, CKMB, CKMBINDEX, TROPONINI in the last 168 hours.  BNP: Invalid input(s): POCBNP  CBG: Recent Labs  Lab 08/10/19 2117  GLUCAP 131*    Microbiology: Results for orders placed or performed during the hospital encounter of 06/19/19  SARS Coronavirus 2 by RT PCR (hospital order, performed in [facility] hospital lab) Nasopharyngeal Nasopharyngeal Swab     Status: None   Collection Time:  06/19/19  9:28 AM   Specimen: Nasopharyngeal Swab  Result Value Ref Range Status   SARS Coronavirus 2 NEGATIVE NEGATIVE Final    Comment: (NOTE) SARS-CoV-2 target nucleic acids are NOT DETECTED.  The SARS-CoV-2 RNA is generally detectable in upper and lower respiratory specimens during the acute phase of infection. The lowest concentration of SARS-CoV-2 viral copies this assay can detect is 250 copies / mL. A negative result does not preclude SARS-CoV-2 infection and should not be used as the sole basis for treatment or other patient management decisions.  A negative result may occur with improper specimen collection / handling, submission of specimen other than nasopharyngeal swab, presence of viral mutation(s) within the areas targeted by this assay, and inadequate number of viral copies (<250 copies / mL). A negative result must be combined with clinical observations, patient history, and epidemiological information.  Fact Sheet for Patients:   StrictlyIdeas.no  Fact Sheet for  Healthcare Providers: BankingDealers.co.za  This test is not yet approved or  cleared by the [place] FDA and has been authorized for detection and/or diagnosis of SARS-CoV-2 by FDA under an Emergency Use Authorization (EUA).  This EUA will remain in effect (meaning this test can be used) for the duration of the COVID-19 declaration under Section 564(b)(1) of the Act, 21 U.S.C. section 360bbb-3(b)(1), unless the authorization is terminated or revoked sooner.  Performed at [facility], [address] [place], [place] [address]     Coagulation Studies: No results for input(s): LABPROT, INR in the last 72 hours.  Urinalysis: No results for input(s): COLORURINE, LABSPEC, PHURINE, GLUCOSEU, HGBUR, BILIRUBINUR, KETONESUR, PROTEINUR, UROBILINOGEN, NITRITE, LEUKOCYTESUR in the last 72 hours.  Invalid input(s): APPERANCEUR    Imaging: No results found.   Medications:   . sodium chloride Stopped (08/09/19 1233)   . aspirin EC  81 mg Oral Daily  . atorvastatin  20 mg Oral Daily  . carvedilol  3.125 mg Oral BID WC  . dextromethorphan-guaiFENesin  1 tablet Oral BID  . ferrous sulfate  325 mg Oral Q breakfast  . FLUoxetine  20 mg Oral Daily  . gabapentin  300 mg Oral TID  . magnesium oxide  400 mg Oral Daily  . midodrine  2.5 mg Oral TID WC  . multivitamin with minerals  1 tablet Oral Daily  . rivaroxaban  20 mg Oral Q supper  . sodium chloride flush  3 mL Intravenous Once  . sodium chloride flush  3 mL Intravenous Q12H  . torsemide  10 mg Oral Daily   sodium chloride, acetaminophen, ALPRAZolam, HYDROcodone-acetaminophen, LORazepam, sodium chloride flush  Assessment/ Plan:  [name] is a 64 y.o. black female with hypertension, hyperlipidemia, depression , polysubstance abuse, recent COVID-19 infection who was admitted to [facility] on 07/26/2019 for Acute CHF (congestive heart failure) ([place]) [I50.9] Acute on chronic congestive heart  failure, unspecified heart failure type ([place]) [I50.9]  1. Acute renal failure on chronic kidney disease stage IIIB with proteinuria: baseline creatinine 1.7, GFR of 32 on 03/08/19.  Chronic kidney disease secondary to hypertension.  Acute renal failure secondary to acute cardiorenal syndrome.  No IV contrast exposure. Renal ultrasound without obstruction.   Not on an ACE-I/ARB due to hypotension.   2. Hypertension and acute exacerbation of chronic systolic and diastolic congestive heart failure and generalized edema: echocardiogram on 06/22/19.  EF of 25 to 99%, grade 3 diastolic dysfunction with pulmonary hypertension - PO torsemide   LOS: 19 Johnnell Liou 8/7/202110:49 AM

## 2019-08-15 NOTE — Consult Note (Signed)
CARDIOLOGY CONSULT NOTE               Patient ID: Katelyn Lane MRN: [id number] DOB/AGE: 09/01/1955 64 y.o.  Admit date: 07/26/2019 Referring Physician Dr. Posey Pronto hospitalist Primary Physician followed in the heart failure clinic with [facility] Primary Cardiologist none Reason for Consultation nonsustained VT heart failure  HPI: Patient presents with nonsustained VT beats asymptomatic.  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  She has been in the hospital about 2 weeks now for heart failure symptoms she states she feels better she is also gotten over a bout of Covid in the past she has hypertension renal insufficiency but has been hypotensive recently denies any chest pain shortness of breath is improved no leg edema electrolytes are being investigated now she does not appear to be hypoxic at this stage no evidence of infection  Review of systems complete and found to be negative unless listed above     Past Medical History:  Diagnosis Date  . CHF (congestive heart failure) ([place])   . COVID-19   . Hyperlipidemia   . Hypertension   . Renal disorder     Past Surgical History:  Procedure Laterality Date  . RIGHT/LEFT HEART CATH AND CORONARY ANGIOGRAPHY N/A 12/30/2018   Procedure: RIGHT/LEFT HEART CATH AND CORONARY ANGIOGRAPHY;  Surgeon: Corey Skains, MD;  Location: [facility];  Service: Cardiovascular;  Laterality: N/A;    Medications Prior to Admission  Medication Sig Dispense Refill Last Dose  . albuterol (VENTOLIN HFA) 108 (90 Base) MCG/ACT inhaler Inhale 2 puffs into the lungs every 6 (six) hours as needed for wheezing or shortness of breath.   PRN at PRN  . allopurinol (ZYLOPRIM) 100 MG tablet Take 1 tablet (100 mg total) by mouth daily. 30 tablet 0 Unknown at Unknown  . ascorbic acid (VITAMIN C) 500 MG tablet Take 500 mg by  mouth daily.   Unknown at Unknown  . aspirin 81 MG chewable tablet Chew 1 tablet (81 mg total) by mouth daily. 30 tablet 0 Unknown at Unknown  . atorvastatin (LIPITOR) 20 MG tablet Take 1 tablet (20 mg total) by mouth daily at 6 PM. 30 tablet 2 Unknown at Unknown  . bumetanide (BUMEX) 1 MG tablet Take 1 tablet (1 mg total) by mouth 2 (two) times daily. May take additional dose for worsening leg swellings/shortness of breath 60 tablet 2 Unknown at Unknown  . carvedilol (COREG) 6.25 MG tablet Take 0.5 tablets (3.125 mg total) by mouth 2 (two) times daily with a meal. 30 tablet 2 Unknown at Unknown  . FLUoxetine (PROZAC) 20 MG capsule Take 1 capsule (20 mg total) by mouth daily. 60 capsule 3 Unknown at Unknown  . Ipratropium-Albuterol (COMBIVENT) 20-100 MCG/ACT AERS respimat Inhale 1 puff into the lungs every 6 (six) hours. 4 g 0 Unknown at Unknown  . loratadine (CLARITIN) 10 MG tablet Take 10 mg by mouth daily as needed for allergies.    PRN at PRN  . nitroGLYCERIN (NITROSTAT) 0.4 MG SL tablet Place 1 tablet (0.4 mg total) under the tongue every 5 (five) minutes as needed for chest pain. 30 tablet 1 PRN at PRN  . pantoprazole (PROTONIX) 40 MG tablet Take 1 tablet (40 mg total) by mouth daily. 30 tablet 0 Unknown at Unknown  . rivaroxaban (XARELTO) 20 MG TABS tablet Take 1 tablet (20 mg total) by mouth daily  with supper. For the blood clot in your heart. 30 tablet 2 Unknown at Unknown  . sacubitril-valsartan (ENTRESTO) 49-51 MG Take 1 tablet by mouth 2 (two) times daily. 60 tablet 0 Unknown at Unknown  . spironolactone (ALDACTONE) 25 MG tablet Take 1 tablet (25 mg total) by mouth daily. 30 tablet 1 Unknown at Unknown  . docusate calcium (SURFAK) 240 MG capsule Take 240 mg by mouth daily as needed for moderate constipation.  (Patient not taking: Reported on 07/27/2019)   Not Taking at Unknown time   Social History   Socioeconomic History  . Marital status: Single    Spouse name: Not on file  . Number  of children: Not on file  . Years of education: Not on file  . Highest education level: Not on file  Occupational History  . Not on file  Tobacco Use  . Smoking status: Current Every Day Smoker  . Smokeless tobacco: Never Used  Substance and Sexual Activity  . Alcohol use: No    Alcohol/week: 0.0 standard drinks  . Drug use: Yes    Types: Cocaine    Comment: 3-4 days ago  . Sexual activity: Not on file  Other Topics Concern  . Not on file  Social History Narrative  . Not on file   Social Determinants of Health   Financial Resource Strain:   . Difficulty of Paying Living Expenses:   Food Insecurity:   . Worried About Charity fundraiser in the Last Year:   . Arboriculturist in the Last Year:   Transportation Needs:   . Film/video editor (Medical):   Marland Kitchen Lack of Transportation (Non-Medical):   Physical Activity:   . Days of Exercise per Week:   . Minutes of Exercise per Session:   Stress:   . Feeling of Stress :   Social Connections:   . Frequency of Communication with Friends and Family:   . Frequency of Social Gatherings with Friends and Family:   . Attends Religious Services:   . Active Member of Clubs or Organizations:   . Attends Archivist Meetings:   Marland Kitchen Marital Status:   Intimate Partner Violence:   . Fear of Current or Ex-Partner:   . Emotionally Abused:   Marland Kitchen Physically Abused:   . Sexually Abused:     Family History  Problem Relation Age of Onset  . Breast cancer Neg Hx       Review of systems complete and found to be negative unless listed above      PHYSICAL EXAM  General: Well developed, well nourished, in no acute distress HEENT:  Normocephalic and atramatic Neck:  No JVD.  Lungs: Clear bilaterally to auscultation and percussion. Heart: HRRR . Normal S1 and S2 without gallops or murmurs.  Abdomen: Bowel sounds are positive, abdomen soft and non-tender  Msk:  Back normal, normal gait. Normal strength and tone for  age. Extremities: No clubbing, cyanosis or edema.   Neuro: Alert and oriented X 3. Psych:  Good affect, responds appropriately  Labs:   Lab Results  Component Value Date   WBC 8.3 08/14/2019   HGB 9.0 (L) 08/14/2019   HCT 30.3 (L) 08/14/2019   MCV 73.4 (L) 08/14/2019   PLT 658 (H) 08/14/2019    Recent Labs  Lab 08/14/19 0551  NA 142  K 4.5  CL 98  CO2 33*  BUN 41*  CREATININE 1.61*  CALCIUM 9.6  PROT 6.5  BILITOT 1.6*  ALKPHOS 240*  ALT 17  AST 28  GLUCOSE 123*   No results found for: CKTOTAL, CKMB, CKMBINDEX, TROPONINI  Lab Results  Component Value Date   CHOL 100 05/22/2019   CHOL 116 04/30/2019   CHOL 126 03/07/2019   Lab Results  Component Value Date   HDL 46 05/22/2019   HDL 41 04/30/2019   HDL 32 (L) 03/07/2019   Lab Results  Component Value Date   LDLCALC 43 05/22/2019   LDLCALC 58 04/30/2019   LDLCALC 50 03/07/2019   Lab Results  Component Value Date   TRIG 56 05/22/2019   TRIG 86 04/30/2019   TRIG 222 (H) 03/07/2019   TRIG 232 (H) 03/07/2019   Lab Results  Component Value Date   CHOLHDL 2.2 05/22/2019   CHOLHDL 2.8 04/30/2019   CHOLHDL 3.9 03/07/2019   No results found for: LDLDIRECT    Radiology: US RENAL  Result Date: 07/30/2019 CLINICAL DATA:  Acute on chronic renal failure. EXAM: RENAL / URINARY TRACT ULTRASOUND COMPLETE COMPARISON:  CT abdomen pelvis 03/24/2019, renal ultrasound 03/25/2018 FINDINGS: Right Kidney: Renal measurements: 9.7 x 4.1 x 4.6 cm = volume: 96 mL . Echogenicity is diffusely increased. There is a cyst in the right kidney measuring 1.4 x 1.0 x 1.4 cm, previously measuring 1.1 x 1.0 x 1.0 cm. Left Kidney: Renal measurements: 10.0 x 5.4 x 5.8 cm = volume: 164 mL. Echogenicity is diffusely increased. No definite mass or hydronephrosis visualized. Bladder: Appears normal for degree of bladder distention. Other: There is mild perihepatic ascites. IMPRESSION: 1. Limited exam secondary to patient body habitus, shadowing  bowel gas and difficulty with positioning. No definite acute finding in the kidneys. 2. Diffusely increased echogenicity of the bilateral kidneys consistent with medical renal disease. 3.  Small cyst in the right kidney. 4.  Mild perihepatic ascites. Electronically Signed   By: Audie Pinto M.D.   On: 07/30/2019 10:50   US Venous Img Lower Bilateral (DVT)  Result Date: 08/01/2019 CLINICAL DATA:  Pain, pitting edema. History tobacco abuse. On Xarelto EXAM: BILATERAL LOWER EXTREMITY VENOUS DOPPLER ULTRASOUND TECHNIQUE: Gray-scale sonography with compression, as well as color and duplex ultrasound, were performed to evaluate the deep venous system(s) from the level of the common femoral vein through the popliteal and proximal calf veins. COMPARISON:  None. FINDINGS: VENOUS Normal compressibility of the common femoral, superficial femoral, and popliteal veins, as well as the visualized calf veins. Visualized portions of profunda femoral vein and great saphenous vein unremarkable. No filling defects to suggest DVT on grayscale or color Doppler imaging. Doppler waveforms show normal direction of venous flow, normal respiratory phasicity and response to augmentation. OTHER Subcutaneous edema in the calves, right greater than left. Limitations: none IMPRESSION: No femoropopliteal DVT nor evidence of DVT within the visualized calf veins. If clinical symptoms are inconsistent or if there are persistent or worsening symptoms, further imaging (possibly involving the iliac veins) may be warranted. Electronically Signed   By: Lucrezia Europe M.D.   On: 08/01/2019 15:16   DG Chest Portable 1 View  Result Date: 07/26/2019 CLINICAL DATA:  Dyspnea and leg edema EXAM: PORTABLE CHEST 1 VIEW COMPARISON:  None. FINDINGS: Again noted is marked cardiomegaly. Prominence of the central pulmonary vasculature with small peripheral interstitial markings are noted. No pleural effusion. No acute osseous abnormality. IMPRESSION:  Cardiomegaly and interstitial edema Electronically Signed   By: Prudencio Pair M.D.   On: 07/26/2019 21:09   DG Foot Complete Right  Result Date: 07/27/2019 CLINICAL DATA:  Leg  swelling right foot pain EXAM: RIGHT FOOT COMPLETE - 3+ VIEW COMPARISON:  01/05/2014 FINDINGS: No acute displaced fracture or malalignment. Chronic erosions at the head of the first metatarsal. Flattening of the plantar arch. IMPRESSION: 1. No acute osseous abnormality. Electronically Signed   By: Donavan Foil M.D.   On: 07/27/2019 19:34    EKG: Normal sinus rhythm low voltage interventricular conduction delay nonspecific ST-T changes  ASSESSMENT AND PLAN:  Nonsustained ventricular tachycardia Frequent PVCs Nonischemic cardiomyopathy ejection fraction less than 25% Acute on chronic systolic dysfunction COPD Severe noncompliance Acute on chronic renal insufficiency Hypertension Hyperlipidemia Obesity Smoking Substance abuse . Plan Agree with telemetry follow-up telemetry strips EKGs Recommend evaluating electrolytes Continue Coreg as a beta-blocker Consider amiodarone but for now with not started Our biggest problem is compliance with this patient Patient has significant psychological and behavioral problem which limits her medical care No direct change in medical management indicated at this stage  Signed: Yolonda Kida MD 08/15/2019, 5:53 PM

## 2019-08-16 DIAGNOSIS — J189 Pneumonia, unspecified organism: Secondary | ICD-10-CM | POA: Diagnosis not present

## 2019-08-16 LAB — CBC
HCT: 32.5 % — ABNORMAL LOW (ref 36.0–46.0)
Hemoglobin: 9.2 g/dL — ABNORMAL LOW (ref 12.0–15.0)
MCH: 21.9 pg — ABNORMAL LOW (ref 26.0–34.0)
MCHC: 28.3 g/dL — ABNORMAL LOW (ref 30.0–36.0)
MCV: 77.4 fL — ABNORMAL LOW (ref 80.0–100.0)
Platelets: 608 10*3/uL — ABNORMAL HIGH (ref 150–400)
RBC: 4.2 MIL/uL (ref 3.87–5.11)
RDW: 24.7 % — ABNORMAL HIGH (ref 11.5–15.5)
WBC: 6 10*3/uL (ref 4.0–10.5)
nRBC: 0 % (ref 0.0–0.2)

## 2019-08-16 LAB — MAGNESIUM: Magnesium: 2.4 mg/dL (ref 1.7–2.4)

## 2019-08-16 LAB — BASIC METABOLIC PANEL
Anion gap: 11 (ref 5–15)
BUN: 40 mg/dL — ABNORMAL HIGH (ref 8–23)
CO2: 28 mmol/L (ref 22–32)
Calcium: 8.8 mg/dL — ABNORMAL LOW (ref 8.9–10.3)
Chloride: 98 mmol/L (ref 98–111)
Creatinine, Ser: 1.66 mg/dL — ABNORMAL HIGH (ref 0.44–1.00)
GFR calc Af Amer: 38 mL/min — ABNORMAL LOW (ref >60)
GFR calc non Af Amer: 32 mL/min — ABNORMAL LOW (ref >60)
Glucose, Bld: 104 mg/dL — ABNORMAL HIGH (ref 70–99)
Potassium: 4.2 mmol/L (ref 3.5–5.1)
Sodium: 137 mmol/L (ref 135–145)

## 2019-08-16 LAB — PROCALCITONIN
Procalcitonin: <0.1 ng/mL
Procalcitonin: <0.1 ng/mL

## 2019-08-16 MED ORDER — SODIUM CHLORIDE 0.9 % IV BOLUS
1000.0000 mL | Freq: Once | INTRAVENOUS | Status: AC
  Administered 2019-08-16: 1000 mL via INTRAVENOUS

## 2019-08-16 NOTE — Progress Notes (Signed)
PROGRESS NOTE    Katelyn Lane   NAT:[id number]  DOB: 14-Apr-1955  PCP: [name], MD    DOA: 07/26/2019 LOS: 6   Brief Narrative   64 year old female with known history of chronic systolic heart failure, recent COVID-19 infection, hyperlipidemia and hypertension, stage IIIb chronic kidney disease presented to the emergency room with acute onset of worsening dyspnea associated with dry cough wheezing, orthopnea and PND as well lower extremity edema for 1 week.  Patient had previous admissions with noncompliance.  She smokes cocaine and she is unfortunately homeless. At the emergency room vitals were stable.  BUN 52/creatinine 2.39.  BNP 4500.  Chest x-ray showed cardiomegaly and interstitial pulmonary edema.  Patient was given IV Lasix and admission requested for CHF exacerbation.   Assessment & Plan   Active Problems:   Hyperlipidemia   Depression   CKD (chronic kidney disease), stage III   Acute metabolic encephalopathy   History of substance abuse (HCC)   Chronic obstructive pulmonary disease (HCC)   Pulmonary hypertension (HCC)   Acute on chronic combined systolic (congestive) and diastolic (congestive) heart failure (HCC)   History of anemia due to chronic kidney disease   Prolonged QT interval   Acute CHF (congestive heart failure) (HCC) Left sided PNA: -pt started on zosyn for HCAP. -pt reported sob and due to limitation of mobility stat cta was done which showed PNA.  Chest pain - Resolved. Reported on 7/31, waxing/waning over past few days, but not previously mentioned.  Seems unlikely ACS.  Troponin 210 >>199.  EKG ordered not obtained.  No further episodes.    Acute on chronic combined congestive heart failure -  Continues to be volume overloaded with pitting edema. Echocardiogram on 06/22/2019 EF 42%, grade 3 diastolic dysfunction, moderate pulmonary hypertension, severely enlarged right ventricle.  Cardiology and nephrology are following.   Lasix  infusion was stopped today.  Started on oral torsemide. Net fluid balance is -12.8 L to date with over 3390cc out yesterday. Continue Coreg and spironolactone.  Hold Entresto. Diuresis changes per cardiology and nephrology.  Currently on torsemide 40 mg PO daily.  Stable and denies any sob. O is negative 1800 ml;will cut down torsemide to 10 mg daily.  Earlier today received report about pt's BP being low pt was asymptomatic and diuretic was held and bolus 1 L ns was given.BMP is ordered.  . AKI on CKD stage IIIb -AKI present on admission, likely secondary to CHF and cardiorenal syndrome.  Improving with  diuresis.  Nephrology is following.  Renal ultrasound was negative for obstruction. will follow creatinine is 1.67 now. BMP ordered now and is pending.  Hypokalemia - due to diuresis.  Replaced.  BMP to monitor, replace as needed. Resolved.  Hypotension - due to ongoing diuresis and beta blocker.  Added low dose midodrine, increase as needed.stable.no change in regimen if bp is low or lower than 706 systolic we will change to lasix from torsemide.   Hx of Hypertension -recently hypotensive.  Continue Coreg but hold for hypotension or bradycardia.  Hold Entresto given AKI.will resume upon discharge.  Anemia of chronic disease / iron deficiency - stable, no evidence of bleeding.  Monitor CBC.  Received IV Venofer.  Started on oral iron supplement.h/h stable  Prolonged QT interval - POA with QTc 507 on EKG.  Avoid QT-prolonging agents as much as possible.  Stopped tramadol.  Monitor closely.  Maintain K>4.0 and Mg>2.0.  Started daily MagOx.  Nonsustained V-tach - has history during previous  admissions as well.  Monitor on telemetry.  Maintain K>4.0 and Mg>2.0.will follow labs.  Cardiology consulted and appreciate management and care.   Bilateral foot pain - unclear if due to tense edema in her feet, or worsened neuropathy.  Already on gabapentin, will not increase given renal function.  PRN  Norco or Tylenol.   Acute metabolic encephalopathy - with confusion, intermittent agitation.  Suspect underlying psychiatric disorder.  Psych consult pending as of 7/30.resolved.  History of ventricular thrombus -continue Xarelto.  Social - Homeless.  Severe medical issues.    APS is on board and pursuing guardianship and placement.  APS requires psychiatric evaluation, consult order has been placed.  We will have to be seen by rounding psych provider over the weekend. Last admission, was discharged to SNF/rehab, but signed herself out.  SNF placement unlikely given ongoing drug abuse.  Patient would have to agree to substance abuse rehab.    Depression/anxiety- chronic, stable.  Evaluated by psychiatry per APS' request and given behavioral disturbances.  Does not meet criteria for inpatient psych admission.  Continue Prozac and as needed Xanax.   Obesity: Body mass index is 30.72 kg/m.  Complicates overall care and prognosis.  DVT prophylaxis:  rivaroxaban (XARELTO) tablet 20 mg   Diet:  Diet Orders (From admission, onward)    Start     Ordered   08/16/19 0923  Diet Heart Room service appropriate? Yes; Fluid consistency: Thin; Fluid restriction: 1200 mL Fluid  Diet effective now       Question Answer Comment  Room service appropriate? Yes   Fluid consistency: Thin   Fluid restriction: 1200 mL Fluid      08/16/19 5176            Code Status: Full Code    Subjective 08/16/19     Pt today is alert awake but is delirious and is restless and confused. APS at bedside along with nurse and PT at bedside Requesting that wee put into writing that pt is or may not be coherent or able to take care herself. Psych consult was already done.  Nephrology following pt and creatinine improving at 1.52 currently, with rec's for renally does meds and avoid nephrotoxic meds agents and contrast. PT following pt with recommendation for Rollator walker and SNF placement for continued rehab and pt for  mobility.  08/13/19 Pt seen today she is alert and awake and is to be d/c once competency and psych assessment is done. Pt is deemed incompetent per psych and d/c is per APS placement.  Patient seen this morning at bedside.  No acute events reported overnight.  She was sleeping during encounter.  Did wake up to voice but would not engage in conversation today.  She refused to work with PT yesterday.  08/14/19 Pt is alert and awake was very happy she worked well with PT.  Per LCSW note no d/c disposition can be offered due to lack of competency issue.   08/15/19 Pt is eating breakfast and feels well. Is waiting placement and aps is involved/  08/16/19 Pt is alert awake and after round received report of pt's sbp being in 80 and given 1L bolus d/c diuretic and repeat afternoon round pt was upright sitting On bed feet on floor and eating and talking to sister regina on phone. Currently pt is being treated for pneumonia and placement.   Disposition Plan & Communication   Status is: Inpatient  Remains inpatient appropriate because:IV treatments appropriate due to intensity of  illness or inability to take PO.  Patient continues to be volume overloaded at this time, undergoing diuresis, continues to require close monitoring of renal function and electrolytes.     Dispo:  Patient From: Home  (was reportedly homeless PTA)  Planned Disposition: Home (?)    Expected discharge date: 1 day  Medically stable for discharge: No  Family Communication: None at bedside, will attempt to call   Consults, Procedures, Significant Events   Consultants:   Nephrology  Cardiology  Psychiatry  Procedures:   None  Antimicrobials:   None   Objective   Vitals:   08/16/19 1140 08/16/19 1310 08/16/19 1526 08/16/19 2018  BP: (!) 87/62 113/85 103/75 112/77  Pulse: 68 75 70 71  Resp:   19 20  Temp:   97.9 F (36.6 C) 97.9 F (36.6 C)  TempSrc:   Oral Oral  SpO2:   100% 100%  Weight:      Height:         Intake/Output Summary (Last 24 hours) at 08/16/2019 2124 Last data filed at 08/16/2019 1700 Gross per 24 hour  Intake 1865.48 ml  Output 400 ml  Net 1465.48 ml   Filed Weights   08/14/19 0418 08/15/19 0536 08/16/19 0455  Weight: 89.4 kg 89.4 kg 89 kg    Physical Exam: Blood pressure 112/77, pulse 71, temperature 97.9 F (36.6 C), temperature source Oral, resp. rate 20, height 5\' 7"  (1.702 m), weight 89 kg, SpO2 100 %.   General exam: Sleeping comfortably, no acute distress Respiratory system: CTAB, no wheezes or rhonchi, normal respiratory effort. Cardiovascular system: normal S1/S2, RRR, no pitting edema BLE's (increasing), feet still with tense edema  Gastrointestinal: Soft and nontender with positive bowel sounds   Labs   Data Reviewed: I have personally reviewed following labs and imaging studies  CBC: Recent Labs  Lab 08/10/19 0553 08/10/19 0553 08/11/19 0539 08/13/19 0930 08/13/19 1723 08/14/19 0551 08/15/19 1740  WBC 8.7  --  9.3 7.3  --  8.3 7.2  6.9  NEUTROABS  --   --   --  4.9  --  6.1 5.0  PENDING  HGB 7.6*   < > 7.6* 7.5* 9.2* 9.0* 10.0*  9.5*  HCT 25.7*   < > 25.7* 26.0* 31.3* 30.3* 33.7*  32.6*  MCV 69.8*  --  69.8* 72.6*  --  73.4* 74.2*  74.6*  PLT 490*  --  562* 607*  --  658* 685*  639*   < > = values in this interval not displayed.   Basic Metabolic Panel: Recent Labs  Lab 08/11/19 0539 08/12/19 0628 08/13/19 0930 08/14/19 0551 08/15/19 1740  NA 135 138 139 142 137  K 3.9 3.6 4.2 4.5 4.2  CL 91* 94* 95* 98 94*  CO2 35* 34* 34* 33* 28  GLUCOSE 141* 122* 151* 123* 89  BUN 52* 48* 44* 41* 40*  CREATININE 1.51* 1.52* 1.74* 1.61* 1.78*  CALCIUM 9.1 9.3 9.7 9.6 9.2  MG 2.1 2.0 2.1 2.3 2.3  PHOS  --   --   --   --  3.7   GFR: Estimated Creatinine Clearance: 37.1 mL/min (A) (by C-G formula based on SCr of 1.78 mg/dL (H)). Liver Function Tests: Recent Labs  Lab 08/13/19 0930 08/14/19 0551 08/15/19 1740  AST 31 28 34  ALT  18 17 20   ALKPHOS 226* 240* 260*  BILITOT 1.2 1.6* 1.3*  PROT 6.1* 6.5 6.9  ALBUMIN 2.2* 2.5* 2.6*   No results  for input(s): LIPASE, AMYLASE in the last 168 hours. No results for input(s): AMMONIA in the last 168 hours. Coagulation Profile: No results for input(s): INR, PROTIME in the last 168 hours. Cardiac Enzymes: No results for input(s): CKTOTAL, CKMB, CKMBINDEX, TROPONINI in the last 168 hours. BNP (last 3 results) No results for input(s): PROBNP in the last 8760 hours. HbA1C: No results for input(s): HGBA1C in the last 72 hours. CBG: Recent Labs  Lab 08/10/19 2117  GLUCAP 131*   Lipid Profile: No results for input(s): CHOL, HDL, LDLCALC, TRIG, CHOLHDL, LDLDIRECT in the last 72 hours. Thyroid Function Tests: Recent Labs    08/15/19 1740  TSH 6.830*  FREET4 1.30*   Anemia Panel: No results for input(s): VITAMINB12, FOLATE, FERRITIN, TIBC, IRON, RETICCTPCT in the last 72 hours. Sepsis Labs: Recent Labs  Lab 08/15/19 1759 08/15/19 2110 08/15/19 2314 08/16/19 0708  PROCALCITON  --   --  <0.10 <0.10  LATICACIDVEN 2.3* 1.8 1.4  --     Recent Results (from the past 240 hour(s))  CULTURE, BLOOD (ROUTINE X 2) w Reflex to ID Panel     Status: None (Preliminary result)   Collection Time: 08/15/19 11:14 PM   Specimen: BLOOD  Result Value Ref Range Status   Specimen Description BLOOD BLOOD RIGHT WRIST  Final   Special Requests   Final    BOTTLES DRAWN AEROBIC AND ANAEROBIC Blood Culture results may not be optimal due to an excessive volume of blood received in culture bottles   Culture   Final    NO GROWTH < 12 HOURS Performed at [facility], [address], [place], [place] [address]    Report Status PENDING  Incomplete  CULTURE, BLOOD (ROUTINE X 2) w Reflex to ID Panel     Status: None (Preliminary result)   Collection Time: 08/15/19 11:21 PM   Specimen: BLOOD  Result Value Ref Range Status   Specimen Description BLOOD BLOOD RIGHT FOREARM  Final    Special Requests   Final    BOTTLES DRAWN AEROBIC AND ANAEROBIC Blood Culture adequate volume   Culture   Final    NO GROWTH < 12 HOURS Performed at [facility], [address] [place], [place] [address]    Report Status PENDING  Incomplete      Imaging Studies   CT HEAD WO CONTRAST  Result Date: 08/15/2019 CLINICAL DATA:  Delirium EXAM: CT HEAD WITHOUT CONTRAST TECHNIQUE: Contiguous axial images were obtained from the base of the skull through the vertex without intravenous contrast. COMPARISON:  May 23, 2019 FINDINGS: Brain: No evidence of acute territorial infarction, hemorrhage, hydrocephalus,extra-axial collection or mass lesion/mass effect. There is dilatation the ventricles and sulci consistent with age-related atrophy. Low-attenuation changes in the deep white matter consistent with small vessel ischemia. Vascular: No hyperdense vessel or unexpected calcification. Skull: The skull is intact. No fracture or focal lesion identified. Sinuses/Orbits: The visualized paranasal sinuses and mastoid air cells are clear. The orbits and globes intact. Other: None IMPRESSION: No acute intracranial abnormality. Findings consistent with age related atrophy and chronic small vessel ischemia Electronically Signed   By: Prudencio Pair M.D.   On: 08/15/2019 19:06   CT ANGIO CHEST PE W OR WO CONTRAST  Result Date: 08/15/2019 CLINICAL DATA:  Chest pain and shortness of breath. EXAM: CT ANGIOGRAPHY CHEST WITH CONTRAST TECHNIQUE: Multidetector CT imaging of the chest was performed using the standard protocol during bolus administration of intravenous contrast. Multiplanar CT image reconstructions and MIPs were obtained  to evaluate the vascular anatomy. CONTRAST:  27mL OMNIPAQUE IOHEXOL 350 MG/ML SOLN COMPARISON:  August 07, 2009 FINDINGS: Cardiovascular: There is mild calcification of the aortic arch. The subsegmental and left lower lobe branches of the pulmonary arteries are limited in evaluation  secondary to suboptimal opacification with intravenous contrast. No evidence of pulmonary embolism. There is marked severity cardiomegaly. No pericardial effusion. Mediastinum/Nodes: No enlarged mediastinal, hilar, or axillary lymph nodes. Thyroid gland, trachea, and esophagus demonstrate no significant findings. Lungs/Pleura: Moderate severity left lower lobe consolidation is seen. There is a small left pleural effusion. A trace amount of pleural fluid is also seen on the right. No pneumothorax is identified. Upper Abdomen: No acute abnormality. Musculoskeletal: Multilevel degenerative changes seen throughout the thoracic spine. Review of the MIP images confirms the above findings. IMPRESSION: 1. Moderate severity left lower lobe consolidation which may represent atelectasis and/or pneumonia. 2. Small left pleural effusion. 3. Marked severity cardiomegaly. 4. Aortic atherosclerosis. Aortic Atherosclerosis (ICD10-I70.0). Electronically Signed   By: Virgina Norfolk M.D.   On: 08/15/2019 19:12   CT ABDOMEN PELVIS W CONTRAST  Result Date: 08/15/2019 CLINICAL DATA:  Chest pain and abdominal pain. EXAM: CT ABDOMEN AND PELVIS WITH CONTRAST TECHNIQUE: Multidetector CT imaging of the abdomen and pelvis was performed using the standard protocol following bolus administration of intravenous contrast. CONTRAST:  15mL OMNIPAQUE IOHEXOL 350 MG/ML SOLN COMPARISON:  None. FINDINGS: Lower chest: Mild atelectasis and/or infiltrate is seen within the visualized portion of the left lung base. There is a small left pleural effusion. There is marked severity cardiomegaly. Hepatobiliary: No focal liver abnormality is seen. No gallstones, gallbladder wall thickening, or biliary dilatation. Pancreas: Unremarkable. No pancreatic ductal dilatation or surrounding inflammatory changes. Spleen: Normal in size without focal abnormality. Adrenals/Urinary Tract: Adrenal glands are unremarkable. Kidneys are normal in size, without renal  calculi or hydronephrosis. Bilateral subcentimeter ill-defined renal cysts are noted. Bladder is unremarkable. Stomach/Bowel: Stomach is within normal limits. Appendix appears normal. No evidence of bowel dilatation. Noninflamed diverticula are seen within the descending and sigmoid colon. Vascular/Lymphatic: There is mild calcification of the abdominal aorta and bilateral common iliac arteries, without evidence of aneurysmal dilatation or dissection. No enlarged abdominal or pelvic lymph nodes. Reproductive: Status post hysterectomy. No adnexal masses. Other: Marked severity soft tissue edema is seen along the lateral aspects of the abdominal and pelvic walls, bilaterally. There is a mild amount of abdominal and pelvic free fluid. Musculoskeletal: No acute or significant osseous findings. IMPRESSION: 1. Mild left basilar atelectasis and/or infiltrate with a small left pleural effusion. 2. Marked severity cardiomegaly. 3. Marked severity soft tissue edema along the lateral aspects of the abdominal and pelvic walls, bilaterally. 4. Mild amount of abdominal and pelvic free fluid. 5. Noninflamed diverticula of the descending and sigmoid colon. 6. Aortic atherosclerosis. Aortic Atherosclerosis (ICD10-I70.0). Electronically Signed   By: Virgina Norfolk M.D.   On: 08/15/2019 19:19     Medications   Scheduled Meds: . aspirin EC  81 mg Oral Daily  . atorvastatin  20 mg Oral Daily  . carvedilol  3.125 mg Oral BID WC  . dextromethorphan-guaiFENesin  1 tablet Oral BID  . ferrous sulfate  325 mg Oral Q breakfast  . FLUoxetine  20 mg Oral Daily  . gabapentin  300 mg Oral TID  . magnesium oxide  400 mg Oral Daily  . midodrine  2.5 mg Oral TID WC  . multivitamin with minerals  1 tablet Oral Daily  . rivaroxaban  20 mg Oral Q  supper  . sodium chloride flush  3 mL Intravenous Once  . sodium chloride flush  3 mL Intravenous Q12H   Continuous Infusions: 1L ns given today    LOS: 20 days    Para Skeans,  MD [facility] [phone] 08/16/2019, 9:24 PM    If 7PM-7AM, please contact night-coverage. How to contact the [facility] Attending or Consulting provider [place] or covering provider during after hours [place], for this patient?    1. Check the care team in [facility] and look for a) attending/consulting [facility] provider listed and b) the [facility] team listed 2. Log into [email or web address] and use [place]'s universal password to access. If you do not have the password, please contact the hospital operator. 3. Locate the [facility] provider you are looking for under [facility] and page to a number that you can be directly reached. 4. If you still have difficulty reaching the provider, please page the [facility] (Director on Call) for the Hospitalists listed on amion for assistance.

## 2019-08-16 NOTE — Progress Notes (Signed)
[facility]  ROUNDING NOTE   Subjective:   No complaints  Objective:  Vital signs in last 24 hours:  Temp:  [97.4 F (36.3 C)-98.7 F (37.1 C)] 97.5 F (36.4 C) (08/08 1129) Pulse Rate:  [67-79] 68 (08/08 1140) Resp:  [14-20] 19 (08/08 1129) BP: (71-121)/(52-91) 87/62 (08/08 1140) SpO2:  [99 %-100 %] 100 % (08/08 1129) Weight:  [89 kg] 89 kg (08/08 0455)  Weight change: -0.489 kg Filed Weights   08/14/19 0418 08/15/19 0536 08/16/19 0455  Weight: 89.4 kg 89.4 kg 89 kg    Intake/Output: I/O last 3 completed shifts: In: 1401.6 [I.V.:1401.6] Out: 3 [Urine:1; Stool:2]   Intake/Output this shift:  No intake/output data recorded.  Physical Exam: General: Laying in bed, NAD   Head: Normocephalic, atraumatic. Moist oral mucosal membranes  Eyes: Anicteric  Lungs:  Clear bilateral, normal effort  Heart: Regular rate and rhythm  Abdomen:  Soft, nontender, BS present  Extremities: + peripheral edema.  Neurologic: Nonfocal, moving all four extremities  Skin: No lesions        Basic Metabolic Panel: Recent Labs  Lab 08/11/19 0539 08/11/19 0539 08/12/19 0628 08/12/19 0628 08/13/19 0930 08/14/19 0551 08/15/19 1740  NA 135  --  138  --  139 142 137  K 3.9  --  3.6  --  4.2 4.5 4.2  CL 91*  --  94*  --  95* 98 94*  CO2 35*  --  34*  --  34* 33* 28  GLUCOSE 141*  --  122*  --  151* 123* 89  BUN 52*  --  48*  --  44* 41* 40*  CREATININE 1.51*  --  1.52*  --  1.74* 1.61* 1.78*  CALCIUM 9.1   < > 9.3   < > 9.7 9.6 9.2  MG 2.1  --  2.0  --  2.1 2.3 2.3  PHOS  --   --   --   --   --   --  3.7   < > = values in this interval not displayed.    Liver Function Tests: Recent Labs  Lab 08/13/19 0930 08/14/19 0551 08/15/19 1740  AST 31 28 34  ALT 18 17 20   ALKPHOS 226* 240* 260*  BILITOT 1.2 1.6* 1.3*  PROT 6.1* 6.5 6.9  ALBUMIN 2.2* 2.5* 2.6*   No results for input(s): LIPASE, AMYLASE in the last 168 hours. No results for input(s): AMMONIA in the last  168 hours.  CBC: Recent Labs  Lab 08/10/19 0553 08/10/19 0553 08/11/19 0539 08/13/19 0930 08/13/19 1723 08/14/19 0551 08/15/19 1740  WBC 8.7  --  9.3 7.3  --  8.3 7.2  6.9  NEUTROABS  --   --   --  4.9  --  6.1 5.0  PENDING  HGB 7.6*   < > 7.6* 7.5* 9.2* 9.0* 10.0*  9.5*  HCT 25.7*   < > 25.7* 26.0* 31.3* 30.3* 33.7*  32.6*  MCV 69.8*  --  69.8* 72.6*  --  73.4* 74.2*  74.6*  PLT 490*  --  562* 607*  --  658* 685*  639*   < > = values in this interval not displayed.    Cardiac Enzymes: No results for input(s): CKTOTAL, CKMB, CKMBINDEX, TROPONINI in the last 168 hours.  BNP: Invalid input(s): POCBNP  CBG: Recent Labs  Lab 08/10/19 2117  GLUCAP 131*    Microbiology: Results for orders placed or performed during the hospital encounter of 07/26/19  CULTURE, BLOOD (ROUTINE X 2) w Reflex to ID Panel     Status: None (Preliminary result)   Collection Time: 08/15/19 11:14 PM   Specimen: BLOOD  Result Value Ref Range Status   Specimen Description BLOOD BLOOD RIGHT WRIST  Final   Special Requests   Final    BOTTLES DRAWN AEROBIC AND ANAEROBIC Blood Culture results may not be optimal due to an excessive volume of blood received in culture bottles   Culture   Final    NO GROWTH < 12 HOURS Performed at [facility], [address] [place], [place] [address]    Report Status PENDING  Incomplete  CULTURE, BLOOD (ROUTINE X 2) w Reflex to ID Panel     Status: None (Preliminary result)   Collection Time: 08/15/19 11:21 PM   Specimen: BLOOD  Result Value Ref Range Status   Specimen Description BLOOD BLOOD RIGHT FOREARM  Final   Special Requests   Final    BOTTLES DRAWN AEROBIC AND ANAEROBIC Blood Culture adequate volume   Culture   Final    NO GROWTH < 12 HOURS Performed at [facility], [address] [place], [place] [address]    Report Status PENDING  Incomplete    Coagulation Studies: No results for input(s): LABPROT, INR in the last 72  hours.  Urinalysis: No results for input(s): COLORURINE, LABSPEC, PHURINE, GLUCOSEU, HGBUR, BILIRUBINUR, KETONESUR, PROTEINUR, UROBILINOGEN, NITRITE, LEUKOCYTESUR in the last 72 hours.  Invalid input(s): APPERANCEUR    Imaging: CT HEAD WO CONTRAST  Result Date: 08/15/2019 CLINICAL DATA:  [place] EXAM: CT HEAD WITHOUT CONTRAST TECHNIQUE: Contiguous axial images were obtained from the base of the skull through the vertex without intravenous contrast. COMPARISON:  May 23, 2019 FINDINGS: Brain: No evidence of acute territorial infarction, hemorrhage, hydrocephalus,extra-axial collection or mass lesion/mass effect. There is dilatation the ventricles and sulci consistent with age-related atrophy. Low-attenuation changes in the deep white matter consistent with small vessel ischemia. Vascular: No hyperdense vessel or unexpected calcification. Skull: The skull is intact. No fracture or focal lesion identified. Sinuses/Orbits: The visualized paranasal sinuses and mastoid air cells are clear. The orbits and globes intact. Other: None IMPRESSION: No acute intracranial abnormality. Findings consistent with age related atrophy and chronic small vessel ischemia Electronically Signed   By: Prudencio Pair M.D.   On: 08/15/2019 19:06   CT ANGIO CHEST PE W OR WO CONTRAST  Result Date: 08/15/2019 CLINICAL DATA:  Chest pain and shortness of breath. EXAM: CT ANGIOGRAPHY CHEST WITH CONTRAST TECHNIQUE: Multidetector CT imaging of the chest was performed using the standard protocol during bolus administration of intravenous contrast. Multiplanar CT image reconstructions and MIPs were obtained to evaluate the vascular anatomy. CONTRAST:  71mL OMNIPAQUE IOHEXOL 350 MG/ML SOLN COMPARISON:  August 07, 2009 FINDINGS: Cardiovascular: There is mild calcification of the aortic arch. The subsegmental and left lower lobe branches of the pulmonary arteries are limited in evaluation secondary to suboptimal opacification with intravenous  contrast. No evidence of pulmonary embolism. There is marked severity cardiomegaly. No pericardial effusion. Mediastinum/Nodes: No enlarged mediastinal, hilar, or axillary lymph nodes. Thyroid gland, trachea, and esophagus demonstrate no significant findings. Lungs/Pleura: Moderate severity left lower lobe consolidation is seen. There is a small left pleural effusion. A trace amount of pleural fluid is also seen on the right. No pneumothorax is identified. Upper Abdomen: No acute abnormality. Musculoskeletal: Multilevel degenerative changes seen throughout the thoracic spine. Review of the MIP images confirms the above findings. IMPRESSION: 1. Moderate severity left  lower lobe consolidation which may represent atelectasis and/or pneumonia. 2. Small left pleural effusion. 3. Marked severity cardiomegaly. 4. Aortic atherosclerosis. Aortic Atherosclerosis (ICD10-I70.0). Electronically Signed   By: Virgina Norfolk M.D.   On: 08/15/2019 19:12   CT ABDOMEN PELVIS W CONTRAST  Result Date: 08/15/2019 CLINICAL DATA:  Chest pain and abdominal pain. EXAM: CT ABDOMEN AND PELVIS WITH CONTRAST TECHNIQUE: Multidetector CT imaging of the abdomen and pelvis was performed using the standard protocol following bolus administration of intravenous contrast. CONTRAST:  87mL OMNIPAQUE IOHEXOL 350 MG/ML SOLN COMPARISON:  None. FINDINGS: Lower chest: Mild atelectasis and/or infiltrate is seen within the visualized portion of the left lung base. There is a small left pleural effusion. There is marked severity cardiomegaly. Hepatobiliary: No focal liver abnormality is seen. No gallstones, gallbladder wall thickening, or biliary dilatation. Pancreas: Unremarkable. No pancreatic ductal dilatation or surrounding inflammatory changes. Spleen: Normal in size without focal abnormality. Adrenals/Urinary Tract: Adrenal glands are unremarkable. Kidneys are normal in size, without renal calculi or hydronephrosis. Bilateral subcentimeter  ill-defined renal cysts are noted. Bladder is unremarkable. Stomach/Bowel: Stomach is within normal limits. Appendix appears normal. No evidence of bowel dilatation. Noninflamed diverticula are seen within the descending and sigmoid colon. Vascular/Lymphatic: There is mild calcification of the abdominal aorta and bilateral common iliac arteries, without evidence of aneurysmal dilatation or dissection. No enlarged abdominal or pelvic lymph nodes. Reproductive: Status post hysterectomy. No adnexal masses. Other: Marked severity soft tissue edema is seen along the lateral aspects of the abdominal and pelvic walls, bilaterally. There is a mild amount of abdominal and pelvic free fluid. Musculoskeletal: No acute or significant osseous findings. IMPRESSION: 1. Mild left basilar atelectasis and/or infiltrate with a small left pleural effusion. 2. Marked severity cardiomegaly. 3. Marked severity soft tissue edema along the lateral aspects of the abdominal and pelvic walls, bilaterally. 4. Mild amount of abdominal and pelvic free fluid. 5. Noninflamed diverticula of the descending and sigmoid colon. 6. Aortic atherosclerosis. Aortic Atherosclerosis (ICD10-I70.0). Electronically Signed   By: Virgina Norfolk M.D.   On: 08/15/2019 19:19     Medications:   . sodium chloride     . aspirin EC  81 mg Oral Daily  . atorvastatin  20 mg Oral Daily  . carvedilol  3.125 mg Oral BID WC  . dextromethorphan-guaiFENesin  1 tablet Oral BID  . ferrous sulfate  325 mg Oral Q breakfast  . FLUoxetine  20 mg Oral Daily  . gabapentin  300 mg Oral TID  . magnesium oxide  400 mg Oral Daily  . midodrine  2.5 mg Oral TID WC  . multivitamin with minerals  1 tablet Oral Daily  . rivaroxaban  20 mg Oral Q supper  . sodium chloride flush  3 mL Intravenous Once  . sodium chloride flush  3 mL Intravenous Q12H   acetaminophen, ALPRAZolam, HYDROcodone-acetaminophen, LORazepam, morphine injection, sodium chloride flush  Assessment/  Plan:  Ms. Tabatha Razzano is a 64 y.o. black female with hypertension, hyperlipidemia, depression , polysubstance abuse, recent COVID-19 infection who was admitted to [facility] on 07/26/2019 for Acute CHF (congestive heart failure) ([place]) [I50.9] Acute on chronic congestive heart failure, unspecified heart failure type ([place]) [I50.9]  1. Acute renal failure on chronic kidney disease stage IIIB with proteinuria: baseline creatinine 1.7, GFR of 32 on 03/08/19.  Chronic kidney disease secondary to hypertension.  Acute renal failure secondary to acute cardiorenal syndrome.  No IV contrast exposure. Renal ultrasound without obstruction.   Not on an ACE-I/ARB due to  hypotension.   2. Acute exacerbation of chronic systolic and diastolic congestive heart failure and generalized edema: echocardiogram on 06/22/19.  EF of 25 to 53%, grade 3 diastolic dysfunction with pulmonary hypertension - PO torsemide  3. Hypotension: from heart failure.  - midodrine  Patient needs outpatient follow up with nephrology. Will sign off. Please call with questions.    LOS: [place] 8/8/202111:59 AM

## 2019-08-16 NOTE — TOC Progression Note (Addendum)
Transition of Care [facility]) - Progression Note    Patient Details  Name: Katelyn Lane MRN: [id number] Date of Birth: [birth year]  Transition of Care [facility] CM/SW Contact  Elliot Gurney [place], [place] Phone Number: [phone] 08/16/2019, 4:32 PM  Clinical Narrative:    Phone call to patient to discuss recommendation for rehab. Patient currently refusing rehab at this time .In addition, she is refusing substance abuse treatment as well.  Patient requesting any other alternatives and would like a call in the morning to discuss additional options for discharge. Patient previously provided with housing and substance abuse resources. APS currently involved, however follow up  is needed.    [address] LCSW Transition of Care [phone]         Expected Discharge Plan and Services                                                 Social Determinants of Health (SDOH) Interventions    Readmission Risk Interventions Readmission Risk Prevention Plan 08/14/2019 07/27/2019 03/27/2019  Transportation Screening Complete Complete Complete  Medication Review [employer]) Referral to Pharmacy - (No Data)  PCP or Specialist appointment within 3-5 days of discharge Complete Complete Complete  HRI or Home Care Consult Complete Complete Complete  SW Recovery Care/Counseling Consult Complete Complete Complete  Palliative Care Screening Not Applicable Not Applicable Not [place] Not Applicable Not Applicable Not Applicable  Some recent data might be hidden

## 2019-08-17 DIAGNOSIS — I5041 Acute combined systolic (congestive) and diastolic (congestive) heart failure: Secondary | ICD-10-CM | POA: Diagnosis not present

## 2019-08-17 LAB — CBC WITH DIFFERENTIAL/PLATELET
Abs Immature Granulocytes: 0.02 10*3/uL (ref 0.00–0.07)
Basophils Absolute: 0.1 10*3/uL (ref 0.0–0.1)
Basophils Relative: 1 %
Eosinophils Absolute: 0.3 10*3/uL (ref 0.0–0.5)
Eosinophils Relative: 5 %
HCT: 30.8 % — ABNORMAL LOW (ref 36.0–46.0)
Hemoglobin: 9.1 g/dL — ABNORMAL LOW (ref 12.0–15.0)
Immature Granulocytes: 0 %
Lymphocytes Relative: 22 %
Lymphs Abs: 1.2 10*3/uL (ref 0.7–4.0)
MCH: 22.1 pg — ABNORMAL LOW (ref 26.0–34.0)
MCHC: 29.5 g/dL — ABNORMAL LOW (ref 30.0–36.0)
MCV: 74.8 fL — ABNORMAL LOW (ref 80.0–100.0)
Monocytes Absolute: 0.6 10*3/uL (ref 0.1–1.0)
Monocytes Relative: 11 %
Neutro Abs: 3.4 10*3/uL (ref 1.7–7.7)
Neutrophils Relative %: 61 %
Platelets: 618 10*3/uL — ABNORMAL HIGH (ref 150–400)
RBC: 4.12 MIL/uL (ref 3.87–5.11)
RDW: 25 % — ABNORMAL HIGH (ref 11.5–15.5)
Smear Review: NORMAL
WBC: 5.5 10*3/uL (ref 4.0–10.5)
nRBC: 0 % (ref 0.0–0.2)

## 2019-08-17 LAB — COMPREHENSIVE METABOLIC PANEL
ALT: 18 U/L (ref 0–44)
AST: 27 U/L (ref 15–41)
Albumin: 2.6 g/dL — ABNORMAL LOW (ref 3.5–5.0)
Alkaline Phosphatase: 251 U/L — ABNORMAL HIGH (ref 38–126)
Anion gap: 10 (ref 5–15)
BUN: 39 mg/dL — ABNORMAL HIGH (ref 8–23)
CO2: 30 mmol/L (ref 22–32)
Calcium: 9.3 mg/dL (ref 8.9–10.3)
Chloride: 99 mmol/L (ref 98–111)
Creatinine, Ser: 1.74 mg/dL — ABNORMAL HIGH (ref 0.44–1.00)
GFR calc Af Amer: 36 mL/min — ABNORMAL LOW (ref >60)
GFR calc non Af Amer: 31 mL/min — ABNORMAL LOW (ref >60)
Glucose, Bld: 105 mg/dL — ABNORMAL HIGH (ref 70–99)
Potassium: 4.2 mmol/L (ref 3.5–5.1)
Sodium: 139 mmol/L (ref 135–145)
Total Bilirubin: 1.2 mg/dL (ref 0.3–1.2)
Total Protein: 6.5 g/dL (ref 6.5–8.1)

## 2019-08-17 LAB — PROCALCITONIN: Procalcitonin: <0.1 ng/mL

## 2019-08-17 LAB — PHOSPHORUS: Phosphorus: 3.9 mg/dL (ref 2.5–4.6)

## 2019-08-17 MED ORDER — TORSEMIDE 10 MG PO TABS
10.0000 mg | ORAL_TABLET | Freq: Every day | ORAL | Status: DC
  Administered 2019-08-18 – 2019-08-22 (×5): 10 mg via ORAL
  Filled 2019-08-17 (×5): qty 1

## 2019-08-17 NOTE — Progress Notes (Signed)
PROGRESS NOTE    Katelyn Lane  OXB:[id number] DOB: 04-21-1955 DOA: 07/26/2019 PCP: Katelyn Burrow, MD    Brief Narrative:   64 year old female with known history of chronic systolic heart failure, recent COVID-19 infection, hyperlipidemia and hypertension, stage IIIb chronic kidney disease presented to the emergency room 07/27/2019 with acute onset of worsening dyspnea associated with dry cough wheezing, orthopnea and PND as well lower extremity edema for 1 week.  Patient had previous admissions with noncompliance.  She smokes cocaine and she is unfortunately homeless. Admitted for acute on chronic systolic and diastolic CHF and AKI on sage IIIb CKD. The patient had a 2D echo on 06/22/2019 that revealed an EF of 25 to 30% with grade 3 diastolic dysfunction, severe left atrial and right atrial dilatation as well as mild mitral regurgitation. Chest x-ray showed cardiomegaly and interstitial pulmonary edema. Patient is 21 days in the hospital today due to disposition problems.  Assessment & Plan:   Active Problems:   Hyperlipidemia   Depression   CKD (chronic kidney disease), stage III   Acute metabolic encephalopathy   History of substance abuse (HCC)   Chronic obstructive pulmonary disease (HCC)   Pulmonary hypertension (HCC)   Acute on chronic combined systolic (congestive) and diastolic (congestive) heart failure (HCC)   History of anemia due to chronic kidney disease   Prolonged QT interval   Acute CHF (congestive heart failure) (HCC)  Acute on chronic combined congestive heart failure: Patient was treated with aggressive IV diuresis, IV Lasix drip.  -13 L, -16 kg weight loss.  Euvolemic now. Renal function fluctuates but is stable. Continue Coreg and Aldactone.  Entresto on hold due to fluctuating renal functions.  Cardiology and nephrology were following, Blood pressures low normal.  Started on midodrine along with Coreg and Aldactone.  Will start patient on low-dose  torsemide as she is euvolemic now.  Acute kidney injury on chronic kidney disease stage IIIb: AKI present on admission secondary to cardiorenal syndrome.  Improved with diuresis.  Creatinine fairly stable today.  Anemia of chronic disease/iron deficiency anemia: Stable.  Received multiple doses of IV iron in the hospital.  Nonsustained V. tach: Electrolyte replaced.  Currently stable.  Acute metabolic encephalopathy: Fluctuating mental status.  No neurological deficit.  Underlying psychiatric disorder, anxiety, drug use, cocaine use disorder.  Seen by psychiatry.  They recommended to continue Prozac and as needed Xanax.  Discontinue all opiates.  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  Social: Drug abuse and homelessness.  Medically complicated.  Waiting for a skilled nursing facility availability.   DVT prophylaxis:  rivaroxaban (XARELTO) tablet 20 mg   Code Status: Full code. Family Communication: Sister Katelyn Lane on phone Disposition Plan: Status is: Inpatient  Remains inpatient appropriate because:Unsafe d/c plan   Dispo:  Patient From: Home  Planned Disposition: Home with Health Care Svc  Expected discharge date: 08/07/19  Medically stable for discharge: No          Consultants:   Cardiology  Nephrology  Procedures:   None  Antimicrobials:   None   Subjective: Patient seen and examined multiple times today.  She was tearful and upset in the morning.  She has very labile mood.  She was crying because social worker told her that she will give her a cab voucher wherever she wants to go. Patient is very inconsistent on giving history.  For me, she denied any problems in the morning rounds.  Objective: Vitals:   08/16/19 2018 08/17/19 0509 08/17/19 1120 08/17/19  1642  BP: 112/77 116/85 117/90 126/90  Pulse: 71 74 87   Resp: 20 18 20    Temp: 97.9 F (36.6 C) 98 F (36.7 C) 98 F (36.7 C)   TempSrc: Oral Oral Oral   SpO2: 100% 100% 93%   Weight:       Height:        Intake/Output Summary (Last 24 hours) at 08/17/2019 1720 Last data filed at 08/17/2019 0510 Gross per 24 hour  Intake 420 ml  Output --  Net 420 ml   Filed Weights   08/14/19 0418 08/15/19 0536 08/16/19 0455  Weight: 89.4 kg 89.4 kg 89 kg    Examination:  Physical Exam Constitutional:      Comments: Chronically sick looking.  Not in any distress.  On room air.  HENT:     Head: Normocephalic.  Cardiovascular:     Rate and Rhythm: Regular rhythm.     Heart sounds: Normal heart sounds.  Pulmonary:     Breath sounds: Normal breath sounds.  Musculoskeletal:     Comments: Bilateral lower extremity edema 1+.  Mostly chronic.  Neurological:     General: No focal deficit present.     Mental Status: She is alert and oriented to person, place, and time.     Comments: Tearful, anxious and fluctuating mood.       Data Reviewed: I have personally reviewed following labs and imaging studies  CBC: Recent Labs  Lab 08/13/19 0930 08/13/19 0930 08/13/19 1723 08/14/19 0551 08/15/19 1740 08/16/19 2126 08/17/19 0535  WBC 7.3  --   --  8.3 7.2  6.9 6.0 5.5  NEUTROABS 4.9  --   --  6.1 5.0  PENDING  --  3.4  HGB 7.5*   < > 9.2* 9.0* 10.0*  9.5* 9.2* 9.1*  HCT 26.0*   < > 31.3* 30.3* 33.7*  32.6* 32.5* 30.8*  MCV 72.6*  --   --  73.4* 74.2*  74.6* 77.4* 74.8*  PLT 607*  --   --  658* 685*  639* 608* 618*   < > = values in this interval not displayed.   Basic Metabolic Panel: Recent Labs  Lab 08/12/19 0628 08/12/19 0628 08/13/19 0930 08/14/19 0551 08/15/19 1740 08/16/19 2126 08/17/19 0535  NA 138   < > 139 142 137 137 139  K 3.6   < > 4.2 4.5 4.2 4.2 4.2  CL 94*   < > 95* 98 94* 98 99  CO2 34*   < > 34* 33* 28 28 30   GLUCOSE 122*   < > 151* 123* 89 104* 105*  BUN 48*   < > 44* 41* 40* 40* 39*  CREATININE 1.52*   < > 1.74* 1.61* 1.78* 1.66* 1.74*  CALCIUM 9.3   < > 9.7 9.6 9.2 8.8* 9.3  MG 2.0  --  2.1 2.3 2.3 2.4  --   PHOS  --   --   --   --   3.7  --  3.9   < > = values in this interval not displayed.   GFR: Estimated Creatinine Clearance: 37.9 mL/min (A) (by C-G formula based on SCr of 1.74 mg/dL (H)). Liver Function Tests: Recent Labs  Lab 08/13/19 0930 08/14/19 0551 08/15/19 1740 08/17/19 0535  AST 31 28 34 27  ALT 18 17 20 18   ALKPHOS 226* 240* 260* 251*  BILITOT 1.2 1.6* 1.3* 1.2  PROT 6.1* 6.5 6.9 6.5  ALBUMIN 2.2* 2.5* 2.6* 2.6*  No results for input(s): LIPASE, AMYLASE in the last 168 hours. No results for input(s): AMMONIA in the last 168 hours. Coagulation Profile: No results for input(s): INR, PROTIME in the last 168 hours. Cardiac Enzymes: No results for input(s): CKTOTAL, CKMB, CKMBINDEX, TROPONINI in the last 168 hours. BNP (last 3 results) No results for input(s): PROBNP in the last 8760 hours. HbA1C: No results for input(s): HGBA1C in the last 72 hours. CBG: Recent Labs  Lab 08/10/19 2117  GLUCAP 131*   Lipid Profile: No results for input(s): CHOL, HDL, LDLCALC, TRIG, CHOLHDL, LDLDIRECT in the last 72 hours. Thyroid Function Tests: Recent Labs    08/15/19 1740  TSH 6.830*  FREET4 1.30*   Anemia Panel: No results for input(s): VITAMINB12, FOLATE, FERRITIN, TIBC, IRON, RETICCTPCT in the last 72 hours. Sepsis Labs: Recent Labs  Lab 08/15/19 1759 08/15/19 2110 08/15/19 2314 08/16/19 0708 08/17/19 0535  PROCALCITON  --   --  <0.10 <0.10 <0.10  LATICACIDVEN 2.3* 1.8 1.4  --   --     Recent Results (from the past 240 hour(s))  CULTURE, BLOOD (ROUTINE X 2) w Reflex to ID Panel     Status: None (Preliminary result)   Collection Time: 08/15/19 11:14 PM   Specimen: BLOOD  Result Value Ref Range Status   Specimen Description BLOOD BLOOD RIGHT WRIST  Final   Special Requests   Final    BOTTLES DRAWN AEROBIC AND ANAEROBIC Blood Culture results may not be optimal due to an excessive volume of blood received in culture bottles   Culture   Final    NO GROWTH 2 DAYS Performed at [facility], [address], [place], [place] [address]    Report Status PENDING  Incomplete  CULTURE, BLOOD (ROUTINE X 2) w Reflex to ID Panel     Status: None (Preliminary result)   Collection Time: 08/15/19 11:21 PM   Specimen: BLOOD  Result Value Ref Range Status   Specimen Description BLOOD BLOOD RIGHT FOREARM  Final   Special Requests   Final    BOTTLES DRAWN AEROBIC AND ANAEROBIC Blood Culture adequate volume   Culture   Final    NO GROWTH 2 DAYS Performed at [facility], [address] [place], [place] [address]    Report Status PENDING  Incomplete         Radiology Studies: CT HEAD WO CONTRAST  Result Date: 08/15/2019 CLINICAL DATA:  Delirium EXAM: CT HEAD WITHOUT CONTRAST TECHNIQUE: Contiguous axial images were obtained from the base of the skull through the vertex without intravenous contrast. COMPARISON:  May 23, 2019 FINDINGS: Brain: No evidence of acute territorial infarction, hemorrhage, hydrocephalus,extra-axial collection or mass lesion/mass effect. There is dilatation the ventricles and sulci consistent with age-related atrophy. Low-attenuation changes in the deep white matter consistent with small vessel ischemia. Vascular: No hyperdense vessel or unexpected calcification. Skull: The skull is intact. No fracture or focal lesion identified. Sinuses/Orbits: The visualized paranasal sinuses and mastoid air cells are clear. The orbits and globes intact. Other: None IMPRESSION: No acute intracranial abnormality. Findings consistent with age related atrophy and chronic small vessel ischemia Electronically Signed   By: Prudencio Pair M.D.   On: 08/15/2019 19:06   CT ANGIO CHEST PE W OR WO CONTRAST  Result Date: 08/15/2019 CLINICAL DATA:  Chest pain and shortness of breath. EXAM: CT ANGIOGRAPHY CHEST WITH CONTRAST TECHNIQUE: Multidetector CT imaging of the chest was performed using the standard protocol during bolus administration of intravenous contrast. Multiplanar  CT image reconstructions and MIPs were obtained to evaluate the vascular anatomy. CONTRAST:  49mL OMNIPAQUE IOHEXOL 350 MG/ML SOLN COMPARISON:  August 07, 2009 FINDINGS: Cardiovascular: There is mild calcification of the aortic arch. The subsegmental and left lower lobe branches of the pulmonary arteries are limited in evaluation secondary to suboptimal opacification with intravenous contrast. No evidence of pulmonary embolism. There is marked severity cardiomegaly. No pericardial effusion. Mediastinum/Nodes: No enlarged mediastinal, hilar, or axillary lymph nodes. Thyroid gland, trachea, and esophagus demonstrate no significant findings. Lungs/Pleura: Moderate severity left lower lobe consolidation is seen. There is a small left pleural effusion. A trace amount of pleural fluid is also seen on the right. No pneumothorax is identified. Upper Abdomen: No acute abnormality. Musculoskeletal: Multilevel degenerative changes seen throughout the thoracic spine. Review of the MIP images confirms the above findings. IMPRESSION: 1. Moderate severity left lower lobe consolidation which may represent atelectasis and/or pneumonia. 2. Small left pleural effusion. 3. Marked severity cardiomegaly. 4. Aortic atherosclerosis. Aortic Atherosclerosis (ICD10-I70.0). Electronically Signed   By: Virgina Norfolk M.D.   On: 08/15/2019 19:12   CT ABDOMEN PELVIS W CONTRAST  Result Date: 08/15/2019 CLINICAL DATA:  Chest pain and abdominal pain. EXAM: CT ABDOMEN AND PELVIS WITH CONTRAST TECHNIQUE: Multidetector CT imaging of the abdomen and pelvis was performed using the standard protocol following bolus administration of intravenous contrast. CONTRAST:  53mL OMNIPAQUE IOHEXOL 350 MG/ML SOLN COMPARISON:  None. FINDINGS: Lower chest: Mild atelectasis and/or infiltrate is seen within the visualized portion of the left lung base. There is a small left pleural effusion. There is marked severity cardiomegaly. Hepatobiliary: No focal liver  abnormality is seen. No gallstones, gallbladder wall thickening, or biliary dilatation. Pancreas: Unremarkable. No pancreatic ductal dilatation or surrounding inflammatory changes. Spleen: Normal in size without focal abnormality. Adrenals/Urinary Tract: Adrenal glands are unremarkable. Kidneys are normal in size, without renal calculi or hydronephrosis. Bilateral subcentimeter ill-defined renal cysts are noted. Bladder is unremarkable. Stomach/Bowel: Stomach is within normal limits. Appendix appears normal. No evidence of bowel dilatation. Noninflamed diverticula are seen within the descending and sigmoid colon. Vascular/Lymphatic: There is mild calcification of the abdominal aorta and bilateral common iliac arteries, without evidence of aneurysmal dilatation or dissection. No enlarged abdominal or pelvic lymph nodes. Reproductive: Status post hysterectomy. No adnexal masses. Other: Marked severity soft tissue edema is seen along the lateral aspects of the abdominal and pelvic walls, bilaterally. There is a mild amount of abdominal and pelvic free fluid. Musculoskeletal: No acute or significant osseous findings. IMPRESSION: 1. Mild left basilar atelectasis and/or infiltrate with a small left pleural effusion. 2. Marked severity cardiomegaly. 3. Marked severity soft tissue edema along the lateral aspects of the abdominal and pelvic walls, bilaterally. 4. Mild amount of abdominal and pelvic free fluid. 5. Noninflamed diverticula of the descending and sigmoid colon. 6. Aortic atherosclerosis. Aortic Atherosclerosis (ICD10-I70.0). Electronically Signed   By: Virgina Norfolk M.D.   On: 08/15/2019 19:19        Scheduled Meds: . aspirin EC  81 mg Oral Daily  . atorvastatin  20 mg Oral Daily  . carvedilol  3.125 mg Oral BID WC  . dextromethorphan-guaiFENesin  1 tablet Oral BID  . ferrous sulfate  325 mg Oral Q breakfast  . FLUoxetine  20 mg Oral Daily  . gabapentin  300 mg Oral TID  . magnesium oxide  400  mg Oral Daily  . midodrine  2.5 mg Oral TID WC  . multivitamin with minerals  1 tablet Oral Daily  .  rivaroxaban  20 mg Oral Q supper  . sodium chloride flush  3 mL Intravenous Once  . sodium chloride flush  3 mL Intravenous Q12H  . [START ON 08/18/2019] torsemide  10 mg Oral Daily   Continuous Infusions:   LOS: 21 days    Time spent: 25 minutes    Barb Merino, MD [facility] Pager [phone]

## 2019-08-17 NOTE — Progress Notes (Signed)
Occupational Therapy Treatment Patient Details Name: Katelyn Lane MRN: [id number] DOB: Jun 26, 1955 Today's Date: 08/17/2019    History of present illness 64 year old female with known history of chronic systolic heart failure, recent COVID-19 infection, hyperlipidemia and hypertension, stage IIIb chronic kidney disease presented to the emergency room 07/27/2019 with acute onset of worsening dyspnea associated with dry cough wheezing, orthopnea and PND as well lower extremity edema for 1 week.  Patient had previous admissions with noncompliance.  She smokes cocaine and she is unfortunately homeless. Admitted for acute on chronic systolic and diastolic CHF and AKI on sage IIIb CKD. The patient had a 2D echo on 06/22/2019 that revealed an EF of 25 to 30% with grade 3 diastolic dysfunction, severe left atrial and right atrial dilatation as well as mild mitral regurgitation. Chest x-ray showed cardiomegaly and interstitial pulmonary edema.   OT comments  Pt very pleasant and motivated for self care tasks this session. Pt bathing self while seated on EOB with min cuing for safety awareness and impulsive behavior. Pt performing lateral leans to wash buttocks and peri area. Pt needing min cuing to utilize figure four position to don B socks with CGA. Pt does appear to fatigue quickly with multiple rest breaks needed and cuing for pursed lip breathing. No O2 this session. Oxygen remains between 94-97% this session with activities. Pt declined ambulating to sink and standing for grooming tasks secondary to fatigue and instead needed set up A to obtain all needed items. Pt continues to benefit from OT intervention to address functional deficits.   Follow Up Recommendations  SNF;Supervision - Intermittent    Equipment Recommendations  3 in 1 bedside commode       Precautions / Restrictions Precautions Precautions: Fall       Mobility Bed Mobility Overal bed mobility: Needs Assistance Bed Mobility: Supine  to Sit     Supine to sit: Supervision     General bed mobility comments: supervision for safety with min cuing  Transfers Overall transfer level: Needs assistance Equipment used: None Transfers: Sit to/from Stand Sit to Stand: Min assist         General transfer comment: min A stand from EOB without use of AD    Balance Overall balance assessment: Needs assistance Sitting-balance support: No upper extremity supported;Feet supported Sitting balance-Leahy Scale: Good Sitting balance - Comments: min cuing for safety awareness but dynamic sitting balance with supervision   Standing balance support: During functional activity;Bilateral upper extremity supported Standing balance-Leahy Scale: Fair Standing balance comment: use of UE support for balance and remains impulsive        ADL either performed or assessed with clinical judgement   ADL Overall ADL's : Needs assistance/impaired     Grooming: Wash/dry hands;Wash/dry face;Oral care;Brushing hair;Sitting;Set up   Upper Body Bathing: Set up;Sitting   Lower Body Bathing: Min guard;Sitting/lateral leans   Upper Body Dressing : Set up;Sitting   Lower Body Dressing: Min guard;Sitting/lateral leans       General ADL Comments: Pt seated on EOB for bathing tasks with set up A for UB self care. Min guard for LB self care. Use of figure four position to don B socks with m in cuing for technique.     Vision Baseline Vision/History: No visual deficits            Cognition Arousal/Alertness: Awake/alert Behavior During Therapy: WFL for tasks assessed/performed Overall Cognitive Status: History of cognitive impairments - at baseline    General Comments: Pt very pleasant and  following commands. Still impulsive with movements and needing min cuing for safety awareness.                   Pertinent Vitals/ Pain       Pain Assessment: No/denies pain         Frequency  Min 1X/week        Progress Toward  Goals  OT Goals(current goals can now be found in the care plan section)  Progress towards OT goals: Progressing toward goals  Acute Rehab OT Goals Patient Stated Goal: to get better OT Goal Formulation: With patient Time For Goal Achievement: 08/31/19  Plan Discharge plan remains appropriate       AM-PAC OT "6 Clicks" Daily Activity     Outcome Measure   Help from another person eating meals?: None Help from another person taking care of personal grooming?: A Little Help from another person toileting, which includes using toliet, bedpan, or urinal?: A Little Help from another person bathing (including washing, rinsing, drying)?: A Little Help from another person to put on and taking off regular upper body clothing?: A Little Help from another person to put on and taking off regular lower body clothing?: A Little 6 Click Score: 19    End of Session    OT Visit Diagnosis: Other abnormalities of gait and mobility (R26.89);Muscle weakness (generalized) (M62.81)   Activity Tolerance Patient tolerated treatment well   Patient Left in bed;with call bell/phone within reach;with bed alarm set   Nurse Communication Mobility status        Time: 1031-1100 OT Time Calculation (min): 29 min  Charges: OT General Charges $OT Visit: 1 Visit OT Treatments $Self Care/Home Management : 23-37 mins   Darleen Crocker, MS, OTR/L , CBIS ascom [phone]  08/17/19, 11:36 AM

## 2019-08-17 NOTE — Progress Notes (Signed)
Physical Therapy Treatment Patient Details Name: Katelyn Lane MRN: [id number] DOB: 1955/04/21 Today's Date: 08/17/2019    History of Present Illness 64 year old female with known history of chronic systolic heart failure, recent COVID-19 infection, hyperlipidemia and hypertension, stage IIIb chronic kidney disease presented to the emergency room 07/27/2019 with acute onset of worsening dyspnea associated with dry cough wheezing, orthopnea and PND as well lower extremity edema for 1 week.  Patient had previous admissions with noncompliance.  She smokes cocaine and she is unfortunately homeless. Admitted for acute on chronic systolic and diastolic CHF and AKI on sage IIIb CKD. The patient had a 2D echo on 06/22/2019 that revealed an EF of 25 to 30% with grade 3 diastolic dysfunction, severe left atrial and right atrial dilatation as well as mild mitral regurgitation. Chest x-ray showed cardiomegaly and interstitial pulmonary edema.    PT Comments    Patient pleasant/cooperative during session and had requested to work with PT today. Patient participated in LE therapeutic exercises for strengthening in sitting position without shortness of breath. Min A for sit to stand transfers with verbal cues for hand placement and safety. Patient ambulated with Min guard assistance using rolling walker, progressing to 30 ft today. Cues for safety and attention to task as patient is easily distracted during ambulation. Activity tolerance is limited by fatigue and patient has shortness of breath with activity. Sp02 95% on room air after walking. Recommend to continue PT to maximize function and safety to facilitate independence.     Follow Up Recommendations  SNF     Equipment Recommendations  Rolling walker with 5" wheels;3in1 (PT);Wheelchair (measurements PT)    Recommendations for Other Services       Precautions / Restrictions Precautions Precautions: Fall Restrictions Weight Bearing Restrictions: No     Mobility  Bed Mobility Overal bed mobility: Needs Assistance Bed Mobility: Supine to Sit     Supine to sit: Supervision     General bed mobility comments: not assessed as patient sitting up on arrival and post session   Transfers Overall transfer level: Needs assistance Equipment used: Rolling walker (2 wheeled) Transfers: Sit to/from Stand Sit to Stand: Min assist         General transfer comment: verbal cues for safety and technique, safe hand placement   Ambulation/Gait Ambulation/Gait assistance: Min guard Gait Distance (Feet): 30 Feet Assistive device: Rolling walker (2 wheeled) Gait Pattern/deviations: Step-through pattern;Trunk flexed Gait velocity: decreased   General Gait Details: multiple standing rest breaks, more due to distraction. cues needed for redirection and attention to task at time. patient was short of breath with activity, Sp02 95% on room air after walking. cues for using rolling walker    Stairs             Wheelchair Mobility    Modified Rankin (Stroke Patients Only)       Balance Overall balance assessment: Needs assistance Sitting-balance support: No upper extremity supported;Feet supported Sitting balance-Leahy Scale: Good Sitting balance - Comments: min cuing for safety awareness but dynamic sitting balance with supervision   Standing balance support: During functional activity;Bilateral upper extremity supported Standing balance-Leahy Scale: Fair Standing balance comment: patient relying heavily on rolling walker for support in standing                             Cognition Arousal/Alertness: Awake/alert Behavior During Therapy: WFL for tasks assessed/performed Overall Cognitive Status: History of cognitive impairments - at baseline  General Comments: patient is cooperative and follows single step commands consistnetly, however needs occasional verbal cues for  safety       Exercises General Exercises - Lower Extremity Ankle Circles/Pumps: 10 reps;AROM;Strengthening;Both;Seated Long Arc Quad: AAROM;Strengthening;Both;10 reps;Seated Hip Flexion/Marching: AAROM;Strengthening;Both;10 reps;Seated Other Exercises Other Exercises: verbal cues for technique     General Comments        Pertinent Vitals/Pain Pain Assessment: No/denies pain Pain Location:  (bilateral feet during walking )    Home Living                      Prior Function            PT Goals (current goals can now be found in the care plan section) Acute Rehab PT Goals Patient Stated Goal: to walk  PT Goal Formulation: With patient Time For Goal Achievement: 08/26/19 Potential to Achieve Goals: Good Progress towards PT goals: Progressing toward goals    Frequency    Min 2X/week      PT Plan Current plan remains appropriate    Co-evaluation              AM-PAC PT "6 Clicks" Mobility   Outcome Measure  Help needed turning from your back to your side while in a flat bed without using bedrails?: A Little Help needed moving from lying on your back to sitting on the side of a flat bed without using bedrails?: A Little Help needed moving to and from a bed to a chair (including a wheelchair)?: A Little Help needed standing up from a chair using your arms (e.g., wheelchair or bedside chair)?: A Little Help needed to walk in hospital room?: A Little Help needed climbing 3-5 steps with a railing? : A Lot 6 Click Score: 17    End of Session Equipment Utilized During Treatment: Gait belt Activity Tolerance: Patient tolerated treatment well Patient left: in chair;with call bell/phone within reach;with chair alarm set;with nursing/sitter in room Nurse Communication: Mobility status PT Visit Diagnosis: Unsteadiness on feet (R26.81);Muscle weakness (generalized) (M62.81);Difficulty in walking, not elsewhere classified (R26.2)     Time: 7628-3151 PT Time  Calculation (min) (ACUTE ONLY): 26 min  Charges:  $Therapeutic Exercise: 8-22 mins $Therapeutic Activity: 8-22 mins                     Minna Merritts, PT, MPT    Percell Locus 08/17/2019, 12:32 PM

## 2019-08-17 NOTE — Progress Notes (Signed)
Patient received her am meds around 1120 this am. Around 1220 she became very anxious, upset, crying, hollering out. She said it was because she had received all of her meds at once. Dr Sloan Leiter did come to see the patient. Patient was given xanax. She is still anxious but is getting better.

## 2019-08-18 DIAGNOSIS — I5041 Acute combined systolic (congestive) and diastolic (congestive) heart failure: Secondary | ICD-10-CM | POA: Diagnosis not present

## 2019-08-18 NOTE — Progress Notes (Signed)
[facility] Cardiology    SUBJECTIVE: Shortness of breath much improved denies any pain leg swelling also improved denies any palpitations or tachycardia   Vitals:   08/17/19 1642 08/17/19 1942 08/18/19 0332 08/18/19 0821  BP: 126/90 (!) 114/99 (!) 112/92 (!) 110/94  Pulse:  72 75 64  Resp:  20 18   Temp:  98.2 F (36.8 C) 98.3 F (36.8 C)   TempSrc:  Oral    SpO2:  95% 100%   Weight:      Height:         Intake/Output Summary (Last 24 hours) at 08/18/2019 1334 Last data filed at 08/18/2019 0900 Gross per 24 hour  Intake 280 ml  Output --  Net 280 ml      PHYSICAL EXAM  General: Well developed, well nourished, in no acute distress HEENT:  Normocephalic and atramatic Neck:  No JVD.  Lungs: Clear bilaterally to auscultation and percussion. Heart: HRRR . Normal S1 and S2 S3-S4 gallops or murmurs.  Abdomen: Bowel sounds are positive, abdomen soft and non-tender  Msk:  Back normal, normal gait. Normal strength and tone for age. Extremities: No clubbing, cyanosis or edema.   Neuro: Alert and oriented X 3. Psych:  Good affect, responds appropriately   LABS: Basic Metabolic Panel: Recent Labs    08/15/19 1740 08/15/19 1740 08/16/19 2126 08/17/19 0535  NA 137   < > 137 139  K 4.2   < > 4.2 4.2  CL 94*   < > 98 99  CO2 28   < > 28 30  GLUCOSE 89   < > 104* 105*  BUN 40*   < > 40* 39*  CREATININE 1.78*   < > 1.66* 1.74*  CALCIUM 9.2   < > 8.8* 9.3  MG 2.3  --  2.4  --   PHOS 3.7  --   --  3.9   < > = values in this interval not displayed.   Liver Function Tests: Recent Labs    08/15/19 1740 08/17/19 0535  AST 34 27  ALT 20 18  ALKPHOS 260* 251*  BILITOT 1.3* 1.2  PROT 6.9 6.5  ALBUMIN 2.6* 2.6*   No results for input(s): LIPASE, AMYLASE in the last 72 hours. CBC: Recent Labs    08/15/19 1740 08/15/19 1740 08/16/19 2126 08/17/19 0535  WBC 7.2  6.9   < > 6.0 5.5  NEUTROABS 5.0  PENDING  --   --  3.4  HGB 10.0*  9.5*   < > 9.2* 9.1*  HCT 33.7*   32.6*   < > 32.5* 30.8*  MCV 74.2*  74.6*   < > 77.4* 74.8*  PLT 685*  639*   < > 608* 618*   < > = values in this interval not displayed.   Cardiac Enzymes: No results for input(s): CKTOTAL, CKMB, CKMBINDEX, TROPONINI in the last 72 hours. BNP: Invalid input(s): POCBNP D-Dimer: No results for input(s): DDIMER in the last 72 hours. Hemoglobin A1C: No results for input(s): HGBA1C in the last 72 hours. Fasting Lipid Panel: No results for input(s): CHOL, HDL, LDLCALC, TRIG, CHOLHDL, LDLDIRECT in the last 72 hours. Thyroid Function Tests: Recent Labs    08/15/19 1740  TSH 6.830*   Anemia Panel: No results for input(s): VITAMINB12, FOLATE, FERRITIN, TIBC, IRON, RETICCTPCT in the last 72 hours.  No results found.   Echo severely depressed left ventricular function less than 25%  TELEMETRY: Left bundle branch block nonspecific ST-T wave changes sinus  rhythm  ASSESSMENT AND PLAN:  Active Problems:   Hyperlipidemia   Depression   CKD (chronic kidney disease), stage III   Acute metabolic encephalopathy   History of substance abuse (HCC)   Chronic obstructive pulmonary disease (HCC)   Pulmonary hypertension (HCC)   Acute on chronic combined systolic (congestive) and diastolic (congestive) heart failure (HCC)   History of anemia due to chronic kidney disease   Prolonged QT interval   Acute CHF (congestive heart failure) ([place])    Plan Continue current medical therapy Continue diuretics as necessary for heart failure Maintain Entresto for cardiomyopathy Follow-up nephrology for renal insufficiency Inhalers as necessary for COPD Increase activity Have the patient follow-up heart failure clinic   Yolonda Kida, MD 08/18/2019 1:34 PM

## 2019-08-18 NOTE — Progress Notes (Signed)
PROGRESS NOTE    Katelyn Lane  GBT:[id number] DOB: [birth year] DOA: 07/26/2019 PCP: [name], MD    Brief Narrative:   64 year old female with known history of chronic systolic heart failure, recent COVID-19 infection, hyperlipidemia and hypertension, stage IIIb chronic kidney disease presented to the emergency room 07/27/2019 with acute onset of worsening dyspnea associated with dry cough wheezing, orthopnea and PND as well lower extremity edema for 1 week.  Patient had previous admissions with noncompliance.  She smokes cocaine and she is unfortunately homeless. Admitted for acute on chronic systolic and diastolic CHF and AKI on sage IIIb CKD. The patient had a 2D echo on 06/22/2019 that revealed an EF of 25 to 30% with grade 3 diastolic dysfunction, severe left atrial and right atrial dilatation as well as mild mitral regurgitation. Chest x-ray showed cardiomegaly and interstitial pulmonary edema. Patient is 22 days in the hospital today due to disposition problems.  Assessment & Plan:   Active Problems:   Hyperlipidemia   Depression   CKD (chronic kidney disease), stage III   Acute metabolic encephalopathy   History of substance abuse (HCC)   Chronic obstructive pulmonary disease (HCC)   Pulmonary hypertension (HCC)   Acute on chronic combined systolic (congestive) and diastolic (congestive) heart failure (HCC)   History of anemia due to chronic kidney disease   Prolonged QT interval   Acute CHF (congestive heart failure) (HCC)  Acute on chronic combined congestive heart failure: Patient was treated with aggressive IV diuresis, IV Lasix drip.  -13 L, -16 kg weight loss.  Euvolemic now. Renal function fluctuates but is stable. Continue Coreg and Aldactone.  Entresto on hold due to fluctuating renal functions.  Cardiology and nephrology were following, Blood pressures low normal.  Started on midodrine along with Coreg and Aldactone. Start torsemide 10 mg daily  today.  Acute kidney injury on chronic kidney disease stage IIIb: AKI present on admission secondary to cardiorenal syndrome.  Improved with diuresis. Creatinine fairly stable today.  Anemia of chronic disease/iron deficiency anemia: Stable.  On oral iron. Received multiple doses of IV iron in the hospital.  Nonsustained V. tach: Electrolyte replaced.  Currently stable.  Acute metabolic encephalopathy: Fluctuating mental status.  No neurological deficit.  Underlying psychiatric disorder, anxiety, drug use, cocaine use disorder.  Seen by psychiatry.  They recommended to continue Prozac and as needed Xanax.  Discontinue all opiates.  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  Social: Drug abuse and homelessness.  Medically complicated.  Waiting for a skilled nursing facility availability.   DVT prophylaxis:  rivaroxaban (XARELTO) tablet 20 mg   Code Status: Full code. Family Communication: Sister [name] on the phone Disposition Plan: Status is: Inpatient  Remains inpatient appropriate because:Unsafe d/c plan   Dispo:  Patient From: Home  Planned Disposition: SNF  Expected discharge date: when bed available.  Medically stable for discharge: No          Consultants:   Cardiology  Nephrology  Procedures:   None  Antimicrobials:   None   Subjective:  Patient was seen and examined.  Today she has no complaints.  She was able to have normal conversation without any emotional issues today.  She has some pain on her left knee and leg.  Walked with help of [employer].  Objective: Vitals:   08/17/19 1942 08/18/19 0332 08/18/19 0821 08/18/19 1338  BP: (!) 114/99 (!) 112/92 (!) 110/94 (!) 117/94  Pulse: 72 75 64 64  Resp: 20 18  20  Temp: 98.2 F (36.8 C) 98.3 F (36.8 C)  97.7 F (36.5 C)  TempSrc: Oral   Oral  SpO2: 95% 100%  92%  Weight:      Height:        Intake/Output Summary (Last 24 hours) at 08/18/2019 1418 Last data filed at  08/18/2019 1300 Gross per 24 hour  Intake 280 ml  Output --  Net 280 ml   Filed Weights   08/14/19 0418 08/15/19 0536 08/16/19 0455  Weight: 89.4 kg 89.4 kg 89 kg    Examination:  Physical Exam Constitutional:      Comments: Chronically sick looking.  Not in any distress.  On room air.  HENT:     Head: Normocephalic.  Cardiovascular:     Rate and Rhythm: Regular rhythm.     Heart sounds: Normal heart sounds.  Pulmonary:     Breath sounds: Normal breath sounds.  Musculoskeletal:     Comments: Bilateral lower extremity edema 1+.  Mostly chronic.  Neurological:     General: No focal deficit present.     Mental Status: She is alert and oriented to person, place, and time.       Data Reviewed: I have personally reviewed following labs and imaging studies  CBC: Recent Labs  Lab 08/13/19 0930 08/13/19 0930 08/13/19 1723 08/14/19 0551 08/15/19 1740 08/16/19 2126 08/17/19 0535  WBC 7.3  --   --  8.3 7.2  6.9 6.0 5.5  NEUTROABS 4.9  --   --  6.1 5.0  PENDING  --  3.4  HGB 7.5*   < > 9.2* 9.0* 10.0*  9.5* 9.2* 9.1*  HCT 26.0*   < > 31.3* 30.3* 33.7*  32.6* 32.5* 30.8*  MCV 72.6*  --   --  73.4* 74.2*  74.6* 77.4* 74.8*  PLT 607*  --   --  658* 685*  639* 608* 618*   < > = values in this interval not displayed.   Basic Metabolic Panel: Recent Labs  Lab 08/12/19 0628 08/12/19 0628 08/13/19 0930 08/14/19 0551 08/15/19 1740 08/16/19 2126 08/17/19 0535  NA 138   < > 139 142 137 137 139  K 3.6   < > 4.2 4.5 4.2 4.2 4.2  CL 94*   < > 95* 98 94* 98 99  CO2 34*   < > 34* 33* 28 28 30   GLUCOSE 122*   < > 151* 123* 89 104* 105*  BUN 48*   < > 44* 41* 40* 40* 39*  CREATININE 1.52*   < > 1.74* 1.61* 1.78* 1.66* 1.74*  CALCIUM 9.3   < > 9.7 9.6 9.2 8.8* 9.3  MG 2.0  --  2.1 2.3 2.3 2.4  --   PHOS  --   --   --   --  3.7  --  3.9   < > = values in this interval not displayed.   GFR: Estimated Creatinine Clearance: 37.9 mL/min (A) (by C-G formula based on SCr of  1.74 mg/dL (H)). Liver Function Tests: Recent Labs  Lab 08/13/19 0930 08/14/19 0551 08/15/19 1740 08/17/19 0535  AST 31 28 34 27  ALT 18 17 20 18   ALKPHOS 226* 240* 260* 251*  BILITOT 1.2 1.6* 1.3* 1.2  PROT 6.1* 6.5 6.9 6.5  ALBUMIN 2.2* 2.5* 2.6* 2.6*   No results for input(s): LIPASE, AMYLASE in the last 168 hours. No results for input(s): AMMONIA in the last 168 hours. Coagulation Profile: No results for input(s): INR, PROTIME  in the last 168 hours. Cardiac Enzymes: No results for input(s): CKTOTAL, CKMB, CKMBINDEX, TROPONINI in the last 168 hours. BNP (last 3 results) No results for input(s): PROBNP in the last 8760 hours. HbA1C: No results for input(s): HGBA1C in the last 72 hours. CBG: No results for input(s): GLUCAP in the last 168 hours. Lipid Profile: No results for input(s): CHOL, HDL, LDLCALC, TRIG, CHOLHDL, LDLDIRECT in the last 72 hours. Thyroid Function Tests: Recent Labs    08/15/19 1740  TSH 6.830*  FREET4 1.30*   Anemia Panel: No results for input(s): VITAMINB12, FOLATE, FERRITIN, TIBC, IRON, RETICCTPCT in the last 72 hours. Sepsis Labs: Recent Labs  Lab 08/15/19 1759 08/15/19 2110 08/15/19 2314 08/16/19 0708 08/17/19 0535  PROCALCITON  --   --  <0.10 <0.10 <0.10  LATICACIDVEN 2.3* 1.8 1.4  --   --     Recent Results (from the past 240 hour(s))  CULTURE, BLOOD (ROUTINE X 2) w Reflex to ID Panel     Status: None (Preliminary result)   Collection Time: 08/15/19 11:14 PM   Specimen: BLOOD  Result Value Ref Range Status   Specimen Description BLOOD BLOOD RIGHT WRIST  Final   Special Requests   Final    BOTTLES DRAWN AEROBIC AND ANAEROBIC Blood Culture results may not be optimal due to an excessive volume of blood received in culture bottles   Culture   Final    NO GROWTH 3 DAYS Performed at [facility], [address] [place], [place] [address]    Report Status PENDING  Incomplete  CULTURE, BLOOD (ROUTINE X 2) w Reflex to ID  Panel     Status: None (Preliminary result)   Collection Time: 08/15/19 11:21 PM   Specimen: BLOOD  Result Value Ref Range Status   Specimen Description BLOOD BLOOD RIGHT FOREARM  Final   Special Requests   Final    BOTTLES DRAWN AEROBIC AND ANAEROBIC Blood Culture adequate volume   Culture   Final    NO GROWTH 3 DAYS Performed at [facility], [address] [name], [place] [address]    Report Status PENDING  Incomplete         Radiology Studies: No results found.      Scheduled Meds: . aspirin EC  81 mg Oral Daily  . atorvastatin  20 mg Oral Daily  . carvedilol  3.125 mg Oral BID WC  . dextromethorphan-guaiFENesin  1 tablet Oral BID  . ferrous sulfate  325 mg Oral Q breakfast  . FLUoxetine  20 mg Oral Daily  . gabapentin  300 mg Oral TID  . magnesium oxide  400 mg Oral Daily  . midodrine  2.5 mg Oral TID WC  . multivitamin with minerals  1 tablet Oral Daily  . rivaroxaban  20 mg Oral Q supper  . sodium chloride flush  3 mL Intravenous Once  . sodium chloride flush  3 mL Intravenous Q12H  . torsemide  10 mg Oral Daily   Continuous Infusions:   LOS: 22 days    Time spent: 25 minutes    Barb Merino, MD [facility] Pager [phone]

## 2019-08-18 NOTE — TOC Progression Note (Signed)
Transition of Care [facility]) - Progression Note    Patient Details  Name: Katelyn Lane MRN: [id number] Date of Birth: 16-Nov-1955  Transition of Care [facility]) CM/SW Contact  Meriel Flavors, LCSW Phone Number: 08/18/2019, 9:40 AM  Clinical Narrative:    8/9: CSW met with patient and APS worker, Ubaldo Glassing. Patient agreed to go to [facility]. SNF work up completed and sent over.          Expected Discharge Plan and Services                                                 Social Determinants of Health (SDOH) Interventions    Readmission Risk Interventions Readmission Risk Prevention Plan 08/14/2019 07/27/2019 03/27/2019  Transportation Screening Complete Complete Complete  Medication Review [employer] photographer) Referral to Pharmacy - (No Data)  PCP or Specialist appointment within 3-5 days of discharge Complete Complete Complete  HRI or Home Care Consult Complete Complete Complete  SW Recovery Care/Counseling Consult Complete Complete Complete  Palliative Care Screening Not Applicable Not Applicable Not [place] Not Applicable Not Applicable Not Applicable  Some recent data might be hidden

## 2019-08-18 NOTE — NC FL2 (Signed)
[place] LEVEL OF CARE SCREENING TOOL     IDENTIFICATION  Patient Name: Katelyn Lane Birthdate: 1955-04-01 Sex: female Admission Date (Current Location): 07/26/2019  [place] and [place] Number:  Selena Lesser [id number]    [place] / [place] and Address:  [facility], [address] [place], [place] [address]      Provider Number:    Attending Physician Name and Address:  Barb Merino, MD  Relative Name and Phone Number:       Current Level of Care: Hospital Recommended Level of Care: [place] Prior Approval Number:    Date Approved/Denied:   PASRR Number:    Discharge Plan: SNF    Current Diagnoses: Patient Active Problem List   Diagnosis Date Noted  . Acute CHF (congestive heart failure) ([place]) 07/27/2019  . Prolonged QT interval 06/24/2019  . History of anemia due to chronic kidney disease 06/19/2019  . CHF, acute on chronic ([place]) 06/03/2019  . Acute on chronic combined systolic (congestive) and diastolic (congestive) heart failure ([place]) 06/03/2019  . Accelerated hypertension   . Ventricular tachycardia, non-sustained ([place])   . Chronic obstructive pulmonary disease ([place])   . Pulmonary hypertension ([place])   . Acute metabolic encephalopathy 52/84/1324  . History of substance abuse ([place]) 05/21/2019  . CHF (congestive heart failure) ([place]) 05/21/2019  . Acute systolic CHF (congestive heart failure) ([place]) 04/08/2019  . Acute on chronic combined systolic and diastolic CHF (congestive heart failure) ([place]) 03/24/2019  . COVID-19 03/06/2019  . Acute respiratory failure with hypoxia ([place]) 03/06/2019  . Hypokalemia 03/06/2019  . LV (left ventricular) mural thrombus 03/06/2019  . CKD (chronic kidney disease), stage III 03/06/2019  . Nausea and vomiting 02/16/2019  . Abdominal pain 02/16/2019  . Rhabdomyolysis 02/16/2019  . Acute kidney injury superimposed on CKD ([place])  02/16/2019  . Crack cocaine use 02/16/2019  . Tobacco abuse 02/16/2019  . Acute on chronic systolic CHF (congestive heart failure) ([place]) 02/16/2019  . Acute congestive heart failure ([place]) 02/13/2019  . Respiratory failure with hypoxia ([place]) 01/31/2019  . Stroke ([place]) 01/18/2019  . Dilated cardiomyopathy ([place]) 12/31/2018  . Chest tightness 12/31/2018  . Elevated troponin 12/31/2018  . Hypertensive urgency 12/31/2018  . Hyperlipidemia 12/31/2018  . Depression 12/31/2018  . HFrEF (heart failure with reduced ejection fraction) ([place]) 12/29/2018    Orientation RESPIRATION BLADDER Height & Weight     Self, Time, Place  Normal External catheter Weight: 196 lb 1.9 oz (89 kg) Height:  5\' 7"  (170.2 cm)  BEHAVIORAL SYMPTOMS/MOOD NEUROLOGICAL BOWEL NUTRITION STATUS  Verbally abusive (Can at times be verbally abusive)   Continent Diet  AMBULATORY STATUS COMMUNICATION OF NEEDS Skin   Limited Assist Verbally Normal                       Personal Care Assistance Level of Assistance  Bathing, Feeding, Dressing Bathing Assistance: Independent Feeding assistance: Independent Dressing Assistance: Independent     Functional Limitations Info             SPECIAL CARE FACTORS FREQUENCY  PT (By licensed PT), OT (By licensed OT)     PT Frequency: 5 X weekly OT Frequency: 5 X weekly            Contractures      Additional Factors Info  Code Status Code Status Info: Full             Current Medications (08/18/2019):  This is the current hospital active medication list Current Facility-Administered Medications  Medication Dose Route Frequency Provider Last Rate Last Admin  . acetaminophen (TYLENOL) tablet 650 mg  650 mg Oral Q4H PRN Mansy, Jan A, MD   650 mg at 08/17/19 2319  . ALPRAZolam Duanne Moron) tablet 0.25 mg  0.25 mg Oral BID PRN Mansy, Jan A, MD   0.25 mg at 08/17/19 2353  . aspirin EC tablet 81 mg  81 mg Oral Daily Mansy, Jan A, MD   81 mg at 08/18/19 5170  .  atorvastatin (LIPITOR) tablet 20 mg  20 mg Oral Daily [name], MD   20 mg at 08/18/19 0174  . carvedilol (COREG) tablet 3.125 mg  3.125 mg Oral BID WC [name], MD   3.125 mg at 08/18/19 9449  . dextromethorphan-guaiFENesin (MUCINEX DM) 30-600 MG per 12 hr tablet 1 tablet  1 tablet Oral BID Nicole Kindred A, DO   1 tablet at 08/18/19 6759  . ferrous sulfate tablet 325 mg  325 mg Oral Q breakfast Nicole Kindred A, DO   325 mg at 08/18/19 1638  . FLUoxetine (PROZAC) capsule 20 mg  20 mg Oral Daily [name], MD   20 mg at 08/18/19 4665  . gabapentin (NEURONTIN) capsule 300 mg  300 mg Oral TID Terrilee Croak, MD   300 mg at 08/18/19 0821  . HYDROcodone-acetaminophen (NORCO/VICODIN) 5-325 MG per tablet 1-2 tablet  1-2 tablet Oral Q6H PRN Nicole Kindred A, DO   1 tablet at 08/17/19 0041  . LORazepam (ATIVAN) injection 0.5 mg  0.5 mg Intravenous Q4H PRN Nicole Kindred A, DO   0.5 mg at 08/15/19 1606  . magnesium oxide (MAG-OX) tablet 400 mg  400 mg Oral Daily Nicole Kindred A, DO   400 mg at 08/18/19 9935  . midodrine (PROAMATINE) tablet 2.5 mg  2.5 mg Oral TID WC Nicole Kindred A, DO   2.5 mg at 08/18/19 7017  . multivitamin with minerals tablet 1 tablet  1 tablet Oral Daily Nicole Kindred A, DO   1 tablet at 08/18/19 7939  . rivaroxaban (XARELTO) tablet 20 mg  20 mg Oral Q supper [name], MD   20 mg at 08/17/19 1643  . sodium chloride flush (NS) 0.9 % injection 3 mL  3 mL Intravenous Once Carrie Mew, MD      . sodium chloride flush (NS) 0.9 % injection 3 mL  3 mL Intravenous Q12H Mansy, Jan A, MD   3 mL at 08/18/19 0300  . sodium chloride flush (NS) 0.9 % injection 3 mL  3 mL Intravenous PRN Mansy, Jan A, MD      . torsemide (DEMADEX) tablet 10 mg  10 mg Oral Daily Barb Merino, MD   10 mg at 08/18/19 9233     Discharge Medications: Please see discharge summary for a list of discharge medications.  Relevant Imaging Results:  Relevant Lab  Results:   Additional Information SS# [id number]  Meriel Flavors, LCSW

## 2019-08-18 NOTE — TOC Progression Note (Signed)
Transition of Care [facility]) - Progression Note    Patient Details  Name: Katelyn Lane MRN: [id number] Date of Birth: [birth year]  Transition of Care [facility]) CM/SW Contact  Meriel Flavors, [place] Phone Number: 08/18/2019, 3:52 PM  Clinical Narrative:    CSW called Danni with [facility], she will call back tomorrow to follow up          Expected Discharge Plan and Services                                                 Social Determinants of Health (SDOH) Interventions    Readmission Risk Interventions Readmission Risk Prevention Plan 08/14/2019 07/27/2019 03/27/2019  Transportation Screening Complete Complete Complete  Medication Review (RN Care Manager) Referral to Pharmacy - (No Data)  PCP or Specialist appointment within 3-5 days of discharge Complete Complete Complete  HRI or Home Care Consult Complete Complete Complete  SW Recovery Care/Counseling Consult Complete Complete Complete  Palliative Care Screening Not Applicable Not Applicable Not [place] Not Applicable Not Applicable Not Applicable  Some recent data might be hidden

## 2019-08-18 NOTE — Progress Notes (Signed)
Mobility Specialist - Progress Note   08/18/19 1312  Mobility  Activity Ambulated in hall  Level of Assistance Minimal assist, patient does 75% or more (CGA for safety precautions)  Assistive Device Front wheel walker  Distance Ambulated (ft) 180 ft  Mobility Response Tolerated well  Mobility performed by Mobility specialist  $Mobility charge 1 Mobility    Pre-mobility: 75 HR, 109/83 BP, 93% SpO2 During mobility: 80 HR, 99% SpO2 Post-mobility: 70 HR, 114/88 BP, 97% SpO2   Pt was lying in bed upon arrival and very agreeable to session. Pt was minA in getting to EOB and modA in sit-to-stand. Pt was encouraged to ambulate as far as she can, w/ chair follow. As a result, pt ambulated a total of 180', maintaining O2 > 98% during ambulation. Seated break needed after ambulating 40', and then again after 95' more. Pt able to ambulate the rest of the way back to room after those 2 breaks. Pt very motivated during session, needing only CGA. Pt did have slight SOB t/o session, but would recover by utilizing energy conservation and pursed-lip breathing technique. Overall, pt tolerated session well. Pt was left in recliner with chair alarm set and call bell/phone in reach. Nurse was notified.     Kathee Delton Mobility Specialist 08/18/19, 1:37 PM

## 2019-08-19 DIAGNOSIS — I5041 Acute combined systolic (congestive) and diastolic (congestive) heart failure: Secondary | ICD-10-CM | POA: Diagnosis not present

## 2019-08-19 MED ORDER — LORAZEPAM 2 MG/ML IJ SOLN
1.0000 mg | Freq: Once | INTRAMUSCULAR | Status: AC
  Administered 2019-08-19: 1 mg via INTRAVENOUS

## 2019-08-19 NOTE — Progress Notes (Signed)
   08/19/19 1300  Clinical Encounter Type  Visited With Patient  Visit Type Follow-up  Referral From Chaplain  Consult/Referral To Chaplain  After talking to Katharine Look, the case manager, chaplain stopped in to see patient. Chaplain talked with patient about going to a facility and how she would need to commit to at least 90 days. Per patient she is willing to commit. Patient mentioned wanting to go outside to get fresh air. Chaplain asked at the nurses' station and was told to speak with patient's nurse. Chaplain will check with [place].

## 2019-08-19 NOTE — Progress Notes (Signed)
Occupational Therapy Treatment Patient Details Name: Kyrene Longan MRN: [id number] DOB: October 22, 1955 Today's Date: 08/19/2019    History of present illness 64 year old female with known history of chronic systolic heart failure, recent COVID-19 infection, hyperlipidemia and hypertension, stage IIIb chronic kidney disease presented to the emergency room 07/27/2019 with acute onset of worsening dyspnea associated with dry cough wheezing, orthopnea and PND as well lower extremity edema for 1 week.  Patient had previous admissions with noncompliance.  She smokes cocaine and she is unfortunately homeless. Admitted for acute on chronic systolic and diastolic CHF and AKI on sage IIIb CKD. The patient had a 2D echo on 06/22/2019 that revealed an EF of 25 to 30% with grade 3 diastolic dysfunction, severe left atrial and right atrial dilatation as well as mild mitral regurgitation. Chest x-ray showed cardiomegaly and interstitial pulmonary edema.   OT comments  Upon entering the room, pt supine in bed and sleeping soundly. Pt agreeable to OT intervention. Supine >sit with supervision to EOB. Pt taking several steps to recliner chair with min hand held assist. OT provided paper handout for B UE strengthening exercises with use of level 2 resistive theraband. Pt returning demonstrations with min cuing for proper technique. Pt performing 3 sets of 10 chest pulls, bicep curls, shoulder diagonals, shoulder elevation, and alternating punches with est breaks as needed. Pt then reports urgent need for BM and transfers onto [facility] with min guard. Pt washing some parts of body while seated on commode with set up A. Sit <>stand for hygiene and clothing management with CGA for balance. Pt returns to recliner chair with chair alarm activated. Call bell and all needed items within reach.   Follow Up Recommendations  SNF;Supervision - Intermittent    Equipment Recommendations  3 in 1 bedside commode       Precautions /  Restrictions Precautions Precautions: Fall       Mobility Bed Mobility Overal bed mobility: Needs Assistance Bed Mobility: Supine to Sit     Supine to sit: Supervision     General bed mobility comments: with increased time and use of bed rail  Transfers Overall transfer level: Needs assistance Equipment used: None Transfers: Sit to/from Stand Sit to Stand: Min assist         General transfer comment: verbal cues for safety and technique, safe hand placement     Balance Overall balance assessment: Needs assistance Sitting-balance support: No upper extremity supported;Feet supported Sitting balance-Leahy Scale: Good Sitting balance - Comments: min cuing for safety awareness but dynamic sitting balance with supervision   Standing balance support: During functional activity;Bilateral upper extremity supported Standing balance-Leahy Scale: Fair Standing balance comment: pt relies heavily on UE support with standing      ADL either performed or assessed with clinical judgement   ADL Overall ADL's : Needs assistance/impaired      Toilet Transfer: Supervision/safety;BSC   Toileting- Water quality scientist and Hygiene: Min guard;Sit to/from stand         General ADL Comments: Pt transferred from recliner chair to [facility] for BM. Pt able to perform hygiene and clothing managemnt with CGA with sitting and standing. Pt does wash some of body while seated on commode as well with set up A.     Vision Baseline Vision/History: No visual deficits            Cognition Arousal/Alertness: Awake/alert Behavior During Therapy: WFL for tasks assessed/performed Overall Cognitive Status: History of cognitive impairments - at baseline     General Comments:  patient is cooperative and follows single step commands consistnetly, however needs occasional verbal cues for safety                    Pertinent Vitals/ Pain       Pain Assessment: No/denies pain Pain Score: 0-No  pain         Frequency  Min 1X/week        Progress Toward Goals  OT Goals(current goals can now be found in the care plan section)  Progress towards OT goals: Progressing toward goals  Acute Rehab OT Goals Patient Stated Goal: to go to rehab OT Goal Formulation: With patient Time For Goal Achievement: 09/02/19  Plan Discharge plan remains appropriate       AM-PAC OT "6 Clicks" Daily Activity     Outcome Measure   Help from another person eating meals?: None Help from another person taking care of personal grooming?: None Help from another person toileting, which includes using toliet, bedpan, or urinal?: A Little Help from another person bathing (including washing, rinsing, drying)?: A Little Help from another person to put on and taking off regular upper body clothing?: None Help from another person to put on and taking off regular lower body clothing?: A Little 6 Click Score: 21    End of Session Equipment Utilized During Treatment: Other (comment) (BSC)  OT Visit Diagnosis: Other abnormalities of gait and mobility (R26.89);Muscle weakness (generalized) (M62.81)   Activity Tolerance Patient tolerated treatment well   Patient Left in bed;with call bell/phone within reach;with bed alarm set   Nurse Communication Mobility status        Time: 1356-1446 OT Time Calculation (min): 50 min  Charges: OT General Charges $OT Visit: 1 Visit OT Treatments $Self Care/Home Management : 23-37 mins $Therapeutic Exercise: 8-22 mins  Darleen Crocker, MS, OTR/L , CBIS ascom [phone]  08/19/19, 3:42 PM

## 2019-08-19 NOTE — Progress Notes (Signed)
PROGRESS NOTE    Katelyn Lane  GNF:[id number] DOB: 1955/03/25 DOA: 07/26/2019 PCP: [name], MD    Brief Narrative:   64 year old female with known history of chronic systolic heart failure, recent COVID-19 infection, hyperlipidemia and hypertension, stage IIIb chronic kidney disease presented to the emergency room 07/27/2019 with acute onset of worsening dyspnea associated with dry cough wheezing, orthopnea and PND as well lower extremity edema for 1 week.  Patient had previous admissions with noncompliance.  She smokes cocaine and she is unfortunately homeless. Admitted for acute on chronic systolic and diastolic CHF and AKI on sage IIIb CKD. The patient had a 2D echo on 06/22/2019 that revealed an EF of 25 to 30% with grade 3 diastolic dysfunction, severe left atrial and right atrial dilatation as well as mild mitral regurgitation. Chest x-ray showed cardiomegaly and interstitial pulmonary edema. Patient is 23 days in the hospital today due to disposition problems.  Assessment & Plan:   Active Problems:   Hyperlipidemia   Depression   CKD (chronic kidney disease), stage III   Acute metabolic encephalopathy   History of substance abuse (HCC)   Chronic obstructive pulmonary disease (HCC)   Pulmonary hypertension (HCC)   Acute on chronic combined systolic (congestive) and diastolic (congestive) heart failure (HCC)   History of anemia due to chronic kidney disease   Prolonged QT interval   Acute CHF (congestive heart failure) (HCC)  Acute on chronic combined congestive heart failure: Patient was treated with aggressive IV diuresis, IV Lasix drip.  -13 L, -16 kg weight loss.  Euvolemic now. Renal function fluctuates but is stable. Continue Coreg and Aldactone.  Entresto on hold due to fluctuating renal functions.  Cardiology and nephrology were following, Blood pressures low normal.  Started on midodrine along with Coreg and Aldactone. Start torsemide 10 mg daily and  tolerating well.  Acute kidney injury on chronic kidney disease stage IIIb: AKI present on admission secondary to cardiorenal syndrome.  Improved with diuresis. Creatinine fairly stable today.  Anemia of chronic disease/iron deficiency anemia: Stable.  On oral iron. Received multiple doses of IV iron in the hospital.  Nonsustained V. tach: Electrolyte replaced.  Currently stable.  Acute metabolic encephalopathy: Fluctuating mental status.  No neurological deficit.  Underlying psychiatric disorder, anxiety, drug use, cocaine use disorder.  Seen by psychiatry.  They recommended to continue Prozac and as needed Xanax.  Discontinue all opiates. Mental status is stable.  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  Social: Drug abuse and homelessness.  Medically complicated.  Waiting for a skilled nursing facility availability.   DVT prophylaxis:  rivaroxaban (XARELTO) tablet 20 mg   Code Status: Full code. Family Communication: Sister [name] on the phone 8/10. Disposition Plan: Status is: Inpatient  Remains inpatient appropriate because:Unsafe d/c plan   Dispo:  Patient From:    Planned Disposition: SNF  Expected discharge date: when bed available.  Medically stable for discharge:  Yes.          Consultants:   Cardiology  Nephrology  Procedures:   None  Antimicrobials:   None   Subjective:  Seen and examined.  No overnight events.  Behavior is well maintained.  She really does not like the pink tablet, however if we think it helps she is ready to take it.  She is agreeable to go to a skilled nursing facility.  Objective: Vitals:   08/18/19 2031 08/19/19 0302 08/19/19 0826 08/19/19 1232  BP: 112/63 114/85 (!) 117/91 (!) 112/92  Pulse: 64  69 75 65  Resp: (!) 24 16  15   Temp: 97.7 F (36.5 C) 97.6 F (36.4 C)  (!) 97.5 F (36.4 C)  TempSrc: Oral Oral  Oral  SpO2: 98% 94%  100%  Weight:      Height:        Intake/Output Summary (Last 24  hours) at 08/19/2019 1431 Last data filed at 08/19/2019 0540 Gross per 24 hour  Intake 480 ml  Output --  Net 480 ml   Filed Weights   08/14/19 0418 08/15/19 0536 08/16/19 0455  Weight: 89.4 kg 89.4 kg 89 kg    Examination:  Physical Exam Constitutional:      Comments: Chronically sick looking.  Not in any distress.  On room air.  HENT:     Head: Normocephalic.  Cardiovascular:     Rate and Rhythm: Regular rhythm.     Heart sounds: Normal heart sounds.  Pulmonary:     Breath sounds: Normal breath sounds.  Musculoskeletal:     Comments: Bilateral lower extremity edema 1+.  Mostly chronic.  Neurological:     General: No focal deficit present.     Mental Status: She is alert and oriented to person, place, and time.       Data Reviewed: I have personally reviewed following labs and imaging studies  CBC: Recent Labs  Lab 08/13/19 0930 08/13/19 0930 08/13/19 1723 08/14/19 0551 08/15/19 1740 08/16/19 2126 08/17/19 0535  WBC 7.3  --   --  8.3 7.2  6.9 6.0 5.5  NEUTROABS 4.9  --   --  6.1 5.0  PENDING  --  3.4  HGB 7.5*   < > 9.2* 9.0* 10.0*  9.5* 9.2* 9.1*  HCT 26.0*   < > 31.3* 30.3* 33.7*  32.6* 32.5* 30.8*  MCV 72.6*  --   --  73.4* 74.2*  74.6* 77.4* 74.8*  PLT 607*  --   --  658* 685*  639* 608* 618*   < > = values in this interval not displayed.   Basic Metabolic Panel: Recent Labs  Lab 08/13/19 0930 08/14/19 0551 08/15/19 1740 08/16/19 2126 08/17/19 0535  NA 139 142 137 137 139  K 4.2 4.5 4.2 4.2 4.2  CL 95* 98 94* 98 99  CO2 34* 33* 28 28 30   GLUCOSE 151* 123* 89 104* 105*  BUN 44* 41* 40* 40* 39*  CREATININE 1.74* 1.61* 1.78* 1.66* 1.74*  CALCIUM 9.7 9.6 9.2 8.8* 9.3  MG 2.1 2.3 2.3 2.4  --   PHOS  --   --  3.7  --  3.9   GFR: Estimated Creatinine Clearance: 37.9 mL/min (A) (by C-G formula based on SCr of 1.74 mg/dL (H)). Liver Function Tests: Recent Labs  Lab 08/13/19 0930 08/14/19 0551 08/15/19 1740 08/17/19 0535  AST 31 28 34  27  ALT 18 17 20 18   ALKPHOS 226* 240* 260* 251*  BILITOT 1.2 1.6* 1.3* 1.2  PROT 6.1* 6.5 6.9 6.5  ALBUMIN 2.2* 2.5* 2.6* 2.6*   No results for input(s): LIPASE, AMYLASE in the last 168 hours. No results for input(s): AMMONIA in the last 168 hours. Coagulation Profile: No results for input(s): INR, PROTIME in the last 168 hours. Cardiac Enzymes: No results for input(s): CKTOTAL, CKMB, CKMBINDEX, TROPONINI in the last 168 hours. BNP (last 3 results) No results for input(s): PROBNP in the last 8760 hours. HbA1C: No results for input(s): HGBA1C in the last 72 hours. CBG: No results for input(s): GLUCAP in the  last 168 hours. Lipid Profile: No results for input(s): CHOL, HDL, LDLCALC, TRIG, CHOLHDL, LDLDIRECT in the last 72 hours. Thyroid Function Tests: No results for input(s): TSH, T4TOTAL, FREET4, T3FREE, THYROIDAB in the last 72 hours. Anemia Panel: No results for input(s): VITAMINB12, FOLATE, FERRITIN, TIBC, IRON, RETICCTPCT in the last 72 hours. Sepsis Labs: Recent Labs  Lab 08/15/19 1759 08/15/19 2110 08/15/19 2314 08/16/19 0708 08/17/19 0535  PROCALCITON  --   --  <0.10 <0.10 <0.10  LATICACIDVEN 2.3* 1.8 1.4  --   --     Recent Results (from the past 240 hour(s))  CULTURE, BLOOD (ROUTINE X 2) w Reflex to ID Panel     Status: None (Preliminary result)   Collection Time: 08/15/19 11:14 PM   Specimen: BLOOD  Result Value Ref Range Status   Specimen Description BLOOD BLOOD RIGHT WRIST  Final   Special Requests   Final    BOTTLES DRAWN AEROBIC AND ANAEROBIC Blood Culture results may not be optimal due to an excessive volume of blood received in culture bottles   Culture   Final    NO GROWTH 4 DAYS Performed at [facility], [address] [place], [place] [address]    Report Status PENDING  Incomplete  CULTURE, BLOOD (ROUTINE X 2) w Reflex to ID Panel     Status: None (Preliminary result)   Collection Time: 08/15/19 11:21 PM   Specimen: BLOOD  Result  Value Ref Range Status   Specimen Description BLOOD BLOOD RIGHT FOREARM  Final   Special Requests   Final    BOTTLES DRAWN AEROBIC AND ANAEROBIC Blood Culture adequate volume   Culture   Final    NO GROWTH 4 DAYS Performed at [facility], [address] [place],  [address]    Report Status PENDING  Incomplete         Radiology Studies: No results found.      Scheduled Meds: . aspirin EC  81 mg Oral Daily  . atorvastatin  20 mg Oral Daily  . carvedilol  3.125 mg Oral BID WC  . dextromethorphan-guaiFENesin  1 tablet Oral BID  . ferrous sulfate  325 mg Oral Q breakfast  . FLUoxetine  20 mg Oral Daily  . gabapentin  300 mg Oral TID  . magnesium oxide  400 mg Oral Daily  . midodrine  2.5 mg Oral TID WC  . multivitamin with minerals  1 tablet Oral Daily  . rivaroxaban  20 mg Oral Q supper  . sodium chloride flush  3 mL Intravenous Once  . sodium chloride flush  3 mL Intravenous Q12H  . torsemide  10 mg Oral Daily   Continuous Infusions:   LOS: 23 days    Time spent: 25 minutes    Barb Merino, MD [facility] Pager [phone]

## 2019-08-19 NOTE — Progress Notes (Signed)
Physical Therapy Treatment Patient Details Name: Katelyn Lane MRN: [id number] DOB: [birth year] Today's Date: 08/19/2019    History of Present Illness 64 year old female with known history of chronic systolic heart failure, recent COVID-19 infection, hyperlipidemia and hypertension, stage IIIb chronic kidney disease presented to the emergency room 07/27/2019 with acute onset of worsening dyspnea associated with dry cough wheezing, orthopnea and PND as well lower extremity edema for 1 week.  Patient had previous admissions with noncompliance.  She smokes cocaine and she is unfortunately homeless. Admitted for acute on chronic systolic and diastolic CHF and AKI on sage IIIb CKD. The patient had a 2D echo on 06/22/2019 that revealed an EF of 25 to 30% with grade 3 diastolic dysfunction, severe left atrial and right atrial dilatation as well as mild mitral regurgitation. Chest x-ray showed cardiomegaly and interstitial pulmonary edema.    PT Comments    Pt was seated in recliner upon arriving. She agrees to PT session and is cooperative throughout. Pt overall continues to progress. Was able to stand with CGA and ambulate >200 ft with RW. No LOB or unsteadiness. Pt will need 24 hour supervision/assistance at DC 2/2 to impulsivity fall risk. She was in recliner with chair alarm in place and call bell in reach.       Follow Up Recommendations  Supervision/Assistance - 24 hour;SNF;Supervision for mobility/OOB     Equipment Recommendations  Rolling walker with 5" wheels    Recommendations for Other Services       Precautions / Restrictions Precautions Precautions: Fall Restrictions Weight Bearing Restrictions: No    Mobility  Bed Mobility Overal bed mobility: Needs Assistance Bed Mobility: Supine to Sit     Supine to sit: Supervision     General bed mobility comments: with increased time and use of bed rail  Transfers Overall transfer level: Needs assistance Equipment used:  None Transfers: Sit to/from Stand Sit to Stand: Min assist         General transfer comment: verbal cues for safety and technique, safe hand placement   Ambulation/Gait Ambulation/Gait assistance: Min guard Gait Distance (Feet): 220 Feet Assistive device: Rolling walker (2 wheeled) Gait Pattern/deviations: Step-through pattern Gait velocity: decreased   General Gait Details: pt was able to ambulate >200 ft with RW without LOB or unsteadiness however pt does endorse fatigue.   Stairs             Wheelchair Mobility    Modified Rankin (Stroke Patients Only)       Balance Overall balance assessment: Needs assistance Sitting-balance support: No upper extremity supported;Feet supported Sitting balance-Leahy Scale: Good Sitting balance - Comments: min cuing for safety awareness but dynamic sitting balance with supervision   Standing balance support: During functional activity;Bilateral upper extremity supported Standing balance-Leahy Scale: Fair Standing balance comment: pt relies heavily on UE support with standing                            Cognition Arousal/Alertness: Awake/alert Behavior During Therapy: WFL for tasks assessed/performed Overall Cognitive Status: History of cognitive impairments - at baseline                                 General Comments: patient is cooperative and follows single step commands consistnetly, however needs occasional verbal cues for safety       Exercises      General Comments  Pertinent Vitals/Pain Pain Assessment: No/denies pain Pain Score: 0-No pain Pain Location: B feet when wt bearing Pain Descriptors / Indicators: Grimacing;Guarding Pain Intervention(s): Limited activity within patient's tolerance;Monitored during session;Premedicated before session;Repositioned    Home Living                      Prior Function            PT Goals (current goals can now be found in  the care plan section) Acute Rehab PT Goals Patient Stated Goal: to go to rehab Progress towards PT goals: Progressing toward goals    Frequency    Min 2X/week      PT Plan Current plan remains appropriate    Co-evaluation              AM-PAC PT "6 Clicks" Mobility   Outcome Measure  Help needed turning from your back to your side while in a flat bed without using bedrails?: A Little Help needed moving from lying on your back to sitting on the side of a flat bed without using bedrails?: A Little Help needed moving to and from a bed to a chair (including a wheelchair)?: A Little Help needed standing up from a chair using your arms (e.g., wheelchair or bedside chair)?: A Little Help needed to walk in hospital room?: A Little Help needed climbing 3-5 steps with a railing? : A Little 6 Click Score: 18    End of Session Equipment Utilized During Treatment: Gait belt Activity Tolerance: Patient tolerated treatment well Patient left: in chair;with call bell/phone within reach;with chair alarm set;with nursing/sitter in room Nurse Communication: Mobility status PT Visit Diagnosis: Unsteadiness on feet (R26.81);Muscle weakness (generalized) (M62.81);Difficulty in walking, not elsewhere classified (R26.2)     Time: 9562-1308 PT Time Calculation (min) (ACUTE ONLY): 14 min  Charges:  $Gait Training: 8-22 mins                     Julaine Fusi PTA 08/19/19, 4:26 PM

## 2019-08-20 DIAGNOSIS — I5041 Acute combined systolic (congestive) and diastolic (congestive) heart failure: Secondary | ICD-10-CM | POA: Diagnosis not present

## 2019-08-20 LAB — CBC
HCT: 35 % — ABNORMAL LOW (ref 36.0–46.0)
Hemoglobin: 10.3 g/dL — ABNORMAL LOW (ref 12.0–15.0)
MCH: 22.7 pg — ABNORMAL LOW (ref 26.0–34.0)
MCHC: 29.4 g/dL — ABNORMAL LOW (ref 30.0–36.0)
MCV: 77.3 fL — ABNORMAL LOW (ref 80.0–100.0)
Platelets: 582 10*3/uL — ABNORMAL HIGH (ref 150–400)
RBC: 4.53 MIL/uL (ref 3.87–5.11)
RDW: 26 % — ABNORMAL HIGH (ref 11.5–15.5)
WBC: 6.3 10*3/uL (ref 4.0–10.5)
nRBC: 0 % (ref 0.0–0.2)

## 2019-08-20 LAB — CULTURE, BLOOD (ROUTINE X 2)
Culture: NO GROWTH
Culture: NO GROWTH
Special Requests: ADEQUATE

## 2019-08-20 LAB — CREATININE, SERUM
Creatinine, Ser: 1.89 mg/dL — ABNORMAL HIGH (ref 0.44–1.00)
GFR calc Af Amer: 32 mL/min — ABNORMAL LOW (ref >60)
GFR calc non Af Amer: 28 mL/min — ABNORMAL LOW (ref >60)

## 2019-08-20 MED ORDER — ENSURE ENLIVE PO LIQD
237.0000 mL | Freq: Two times a day (BID) | ORAL | Status: DC
  Administered 2019-08-20 – 2019-08-31 (×17): 237 mL via ORAL

## 2019-08-20 MED ORDER — ALBUTEROL SULFATE (2.5 MG/3ML) 0.083% IN NEBU
2.5000 mg | INHALATION_SOLUTION | RESPIRATORY_TRACT | Status: DC | PRN
  Administered 2019-08-26 – 2019-08-28 (×6): 2.5 mg via RESPIRATORY_TRACT
  Filled 2019-08-20 (×7): qty 3

## 2019-08-20 NOTE — Progress Notes (Signed)
Nutrition Follow Up Note   DOCUMENTATION CODES:   Obesity unspecified  INTERVENTION:   Ensure Enlive po BID, each supplement provides 350 kcal and 20 grams of protein  Magic cup TID with meals, each supplement provides 290 kcal and 9 grams of protein  Continue MVI daily   NUTRITION DIAGNOSIS:   Inadequate oral intake related to acute illness as evidenced by per patient/family report.  GOAL:   Patient will meet greater than or equal to 90% of their needs  -progressing   MONITOR:   PO intake, Supplement acceptance, Labs, Weight trends, Skin, I & O's  ASSESSMENT:   64 year old female with known history of chronic systolic heart failure, polysubstance abuse, homelessness, recent COVID-19 infection, hyperlipidemia and hypertension, stage IIIb chronic kidney disease who is admitted with CHF   Met with pt in room today. Pt reports improved appetite and oral intake in hospital. Pt ate chicken sausage, toast and cheerios with milk for breakfast this morning. Supplements previously discontinued as pt was refusing them but pt reports that she would like to have supplements ordered again. RD will add supplements back per pt request. Per chart, pt down 49lbs since admit mostly r/t fluid changes. Pt has not had a new weight taken since 8/8.   Medications reviewed and include: aspirin, ferrous sulfate, Mg oxide, MVI  Labs reviewed: BUN 39(H), creat 1.89(H), P 3.9 wnl Hgb 10.3(L), Hct 35.0(L), MCV 77.3(L), MCH 22.7(L), MCHC 29.4(L)  Nutrition-Focused physical exam completed. Findings are no fat depletions, no muscle depletions and no edema.   Diet Order:   Diet Order            Diet 2 gram sodium Room service appropriate? Yes; Fluid consistency: Thin; Fluid restriction: 1200 mL Fluid  Diet effective now                EDUCATION NEEDS:   Education needs have been addressed  Skin:  Skin Assessment: Reviewed RN Assessment (ecchymosis)  Last BM:  8/12- type 6  Height:   Ht  Readings from Last 1 Encounters:  07/30/19 '5\' 7"'$  (1.702 m)    Weight:   Wt Readings from Last 1 Encounters:  08/16/19 89 kg    Ideal Body Weight:  61.36 kg  BMI:  Body mass index is 30.72 kg/m.  Estimated Nutritional Needs:   Kcal:  1900-2200kcal/day  Protein:  95-110g/day  Fluid:  1.8L/day or per MD  Koleen Distance MS, RD, LDN Please refer to [facility] for RD and/or RD on-call/weekend/after hours pager

## 2019-08-20 NOTE — Progress Notes (Signed)
Physical Therapy Treatment Patient Details Name: Katelyn Lane MRN: [id number] DOB: Dec 14, 1955 Today's Date: 08/20/2019    History of Present Illness 64 year old female with known history of chronic systolic heart failure, recent COVID-19 infection, hyperlipidemia and hypertension, stage IIIb chronic kidney disease presented to the emergency room 07/27/2019 with acute onset of worsening dyspnea associated with dry cough wheezing, orthopnea and PND as well lower extremity edema for 1 week.  Patient had previous admissions with noncompliance.  She smokes cocaine and she is unfortunately homeless. Admitted for acute on chronic systolic and diastolic CHF and AKI on sage IIIb CKD. The patient had a 2D echo on 06/22/2019 that revealed an EF of 25 to 30% with grade 3 diastolic dysfunction, severe left atrial and right atrial dilatation as well as mild mitral regurgitation. Chest x-ray showed cardiomegaly and interstitial pulmonary edema.    PT Comments    Pt was in recliner upon arriving. She agrees to PT session and is cooperative throughout. Eager to get OOB and ambulate. She is slightly impulsive at times but does follow commands consistently throughout. Poor carryover between sessions. She stood to RW with CGA and ambulated 300 ft without LOB. She does however needs constant vcs for posture correction and staying inside RW. Once returned to room, reviewed ther ex to promote circulation to feet and improve functional strength. She performed without difficulty. At conclusion of session, pt was seated in recliner with call bell in reach and chair alarm in place.    Follow Up Recommendations  Supervision/Assistance - 24 hour;Supervision for mobility/OOB     Equipment Recommendations  Rolling walker with 5" wheels    Recommendations for Other Services       Precautions / Restrictions Precautions Precautions: Fall Precaution Comments: High fall Restrictions Weight Bearing Restrictions: No     Mobility  Bed Mobility               General bed mobility comments: was in recliner pre/post session  Transfers Overall transfer level: Needs assistance Equipment used: Rolling walker (2 wheeled) Transfers: Sit to/from Stand Sit to Stand: Min guard         General transfer comment: CGA for safety  Ambulation/Gait Ambulation/Gait assistance: Min guard Gait Distance (Feet): 300 Feet Assistive device: Rolling walker (2 wheeled) Gait Pattern/deviations: Step-through pattern;Trunk flexed Gait velocity: decreased   General Gait Details: no LOB or unsteadiness however pt does endorse fatigue   Stairs             Wheelchair Mobility    Modified Rankin (Stroke Patients Only)       Balance Overall balance assessment: Needs assistance Sitting-balance support: No upper extremity supported;Feet supported Sitting balance-Leahy Scale: Good Sitting balance - Comments: pt does not have LOB in sitting even with reching outside BOS   Standing balance support: During functional activity;Bilateral upper extremity supported Standing balance-Leahy Scale: Fair Standing balance comment: pt is at high risk but did not have LOB during standing                             Cognition Arousal/Alertness: Awake/alert Behavior During Therapy: WFL for tasks assessed/performed Overall Cognitive Status: History of cognitive impairments - at baseline                                 General Comments: Pt is alert and oriented x 3 but impulsive at times. Is  able to follow commands throughout.      Exercises Other Exercises Other Exercises: reviewed and pt performed ther ex in recliner and static standing to promote increase in strength.    General Comments        Pertinent Vitals/Pain Pain Assessment: No/denies pain Pain Score: 0-No pain Faces Pain Scale: No hurt    Home Living                      Prior Function            PT Goals  (current goals can now be found in the care plan section) Acute Rehab PT Goals Patient Stated Goal: " I'm ready to go home" Progress towards PT goals: Progressing toward goals    Frequency    Min 2X/week      PT Plan Current plan remains appropriate    Co-evaluation              AM-PAC PT "6 Clicks" Mobility   Outcome Measure  Help needed turning from your back to your side while in a flat bed without using bedrails?: A Little Help needed moving from lying on your back to sitting on the side of a flat bed without using bedrails?: A Little Help needed moving to and from a bed to a chair (including a wheelchair)?: A Little Help needed standing up from a chair using your arms (e.g., wheelchair or bedside chair)?: A Little Help needed to walk in hospital room?: A Little Help needed climbing 3-5 steps with a railing? : A Little 6 Click Score: 18    End of Session Equipment Utilized During Treatment: Gait belt Activity Tolerance: Patient tolerated treatment well Patient left: in chair;with call bell/phone within reach;with chair alarm set;with nursing/sitter in room Nurse Communication: Mobility status PT Visit Diagnosis: Unsteadiness on feet (R26.81);Muscle weakness (generalized) (M62.81);Difficulty in walking, not elsewhere classified (R26.2)     Time: 6644-0347 PT Time Calculation (min) (ACUTE ONLY): 23 min  Charges:  $Gait Training: 8-22 mins $Therapeutic Activity: 8-22 mins                     Julaine Fusi PTA 08/20/19, 12:46 PM

## 2019-08-20 NOTE — Progress Notes (Signed)
Mobility Specialist - Progress Note   08/20/19 1418  Mobility  Activity Ambulated in room;Ambulated in hall  Level of Assistance Contact guard assist, steadying assist  [place] wheel walker  Distance Ambulated (ft) 200 ft  Mobility Response Tolerated well  Mobility performed by Mobility specialist  $Mobility charge 1 Mobility    Pre-mobility: 71 HR, 92% SpO2 Post-mobility: 81 HR, 98% SPO2   Pt sitting EOB w/ NT present in room upon arrival. Pt agreed to session. Pt needed CGA t/o session. Pt ambulated 200' total in room and in hallway using RW. Pt did c/o soreness and pain in left foot while ambulating. Overall, pt tolerated session well. Nurse was notified. Pt left in bed w/ alarm set and call bell and phone placed in reach.    Rhilyn Battle Mobility Specialist  08/20/19, 2:25 PM

## 2019-08-20 NOTE — Progress Notes (Signed)
PROGRESS NOTE    Sibley Rolison  OIN:[id number] DOB: [birth year] DOA: 07/26/2019 PCP: [name], MD    Brief Narrative:   64 year old female with known history of chronic systolic heart failure, recent COVID-19 infection, hyperlipidemia and hypertension, stage IIIb chronic kidney disease presented to the emergency room 07/27/2019 with acute onset of worsening dyspnea associated with dry cough wheezing, orthopnea and PND as well lower extremity edema for 1 week.  Patient had previous admissions with noncompliance.  She smokes cocaine and she is unfortunately homeless. Admitted for acute on chronic systolic and diastolic CHF and AKI on sage IIIb CKD. The patient had a 2D echo on 06/22/2019 that revealed an EF of 25 to 30% with grade 3 diastolic dysfunction, severe left atrial and right atrial dilatation as well as mild mitral regurgitation. Chest x-ray showed cardiomegaly and interstitial pulmonary edema. Patient is 24 days in the hospital today due to disposition problems.  Assessment & Plan:   Active Problems:   Hyperlipidemia   Depression   CKD (chronic kidney disease), stage III   Acute metabolic encephalopathy   History of substance abuse (HCC)   Chronic obstructive pulmonary disease (HCC)   Pulmonary hypertension (HCC)   Acute on chronic combined systolic (congestive) and diastolic (congestive) heart failure (HCC)   History of anemia due to chronic kidney disease   Prolonged QT interval   Acute CHF (congestive heart failure) (HCC)  Acute on chronic combined congestive heart failure: Patient was treated with aggressive IV diuresis, IV Lasix drip.  -13 L, -16 kg weight loss.  Euvolemic now. Renal function fluctuates but is stable. Continue Coreg and Aldactone.  Entresto on hold due to fluctuating renal functions.  Cardiology and nephrology were following, Blood pressures low normal.  Started on midodrine along with Coreg and Aldactone. Start torsemide 10 mg daily and  tolerating well.  Acute kidney injury on chronic kidney disease stage IIIb: AKI present on admission secondary to cardiorenal syndrome.  Improved with diuresis. Creatinine fairly stable today.  Anemia of chronic disease/iron deficiency anemia: Stable.  On oral iron. Received multiple doses of IV iron in the hospital.  Nonsustained V. tach: Electrolyte replaced.  Currently stable.  Acute metabolic encephalopathy: Fluctuating mental status.  No neurological deficit.  Underlying psychiatric disorder, anxiety, drug use, cocaine use disorder.  Seen by psychiatry.  They recommended to continue Prozac and as needed Xanax.  Discontinue all opiates. Mental status is stable.  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  Social: Drug abuse and homelessness.  Medically complicated.  Waiting for a skilled nursing facility availability.   DVT prophylaxis:  rivaroxaban (XARELTO) tablet 20 mg   Code Status: Full code. Family Communication: Sister [name] on the phone 8/10. Disposition Plan: Status is: Inpatient  Remains inpatient appropriate because:Unsafe d/c plan   Dispo:  Patient From:    Planned Disposition: SNF  Expected discharge date: when bed available.  Medically stable for discharge:  Yes.     Consultants:   Cardiology  Nephrology  Procedures:   None  Antimicrobials:   None   Subjective:  Patient seen and examined.  No overnight events.  Today she was very elated.  "I am trying to get out of here, I may sneak out of here"  Objective: Vitals:   08/19/19 1232 08/19/19 2025 08/20/19 0353 08/20/19 0759  BP: (!) 112/92 106/82 (!) 117/96 116/79  Pulse: 65 67 68 78  Resp: 15 (!) 24 16 20   Temp: (!) 97.5 F (36.4 C) 98.6 F (37  C) 97.7 F (36.5 C) 97.7 F (36.5 C)  TempSrc: Oral  Oral Oral  SpO2: 100% 100% 99% 100%  Weight:      Height:        Intake/Output Summary (Last 24 hours) at 08/20/2019 1354 Last data filed at 08/20/2019 0900 Gross per 24 hour    Intake 240 ml  Output --  Net 240 ml   Filed Weights   08/14/19 0418 08/15/19 0536 08/16/19 0455  Weight: 89.4 kg 89.4 kg 89 kg    Examination:  Physical Exam Constitutional:      Comments: Chronically sick looking.  Not in any distress.  On room air.  HENT:     Head: Normocephalic.  Cardiovascular:     Rate and Rhythm: Regular rhythm.     Heart sounds: Normal heart sounds.  Pulmonary:     Breath sounds: Normal breath sounds.  Musculoskeletal:     Comments: Bilateral lower extremity edema 1+.  Mostly chronic.  Neurological:     General: No focal deficit present.     Mental Status: She is alert and oriented to person, place, and time.       Data Reviewed: I have personally reviewed following labs and imaging studies  CBC: Recent Labs  Lab 08/14/19 0551 08/15/19 1740 08/16/19 2126 08/17/19 0535 08/20/19 0525  WBC 8.3 7.2  6.9 6.0 5.5 6.3  NEUTROABS 6.1 5.0  PENDING  --  3.4  --   HGB 9.0* 10.0*  9.5* 9.2* 9.1* 10.3*  HCT 30.3* 33.7*  32.6* 32.5* 30.8* 35.0*  MCV 73.4* 74.2*  74.6* 77.4* 74.8* 77.3*  PLT 658* 685*  639* 608* 618* 062*   Basic Metabolic Panel: Recent Labs  Lab 08/14/19 0551 08/15/19 1740 08/16/19 2126 08/17/19 0535 08/20/19 0525  NA 142 137 137 139  --   K 4.5 4.2 4.2 4.2  --   CL 98 94* 98 99  --   CO2 33* 28 28 30   --   GLUCOSE 123* 89 104* 105*  --   BUN 41* 40* 40* 39*  --   CREATININE 1.61* 1.78* 1.66* 1.74* 1.89*  CALCIUM 9.6 9.2 8.8* 9.3  --   MG 2.3 2.3 2.4  --   --   PHOS  --  3.7  --  3.9  --    GFR: Estimated Creatinine Clearance: 34.9 mL/min (A) (by C-G formula based on SCr of 1.89 mg/dL (H)). Liver Function Tests: Recent Labs  Lab 08/14/19 0551 08/15/19 1740 08/17/19 0535  AST 28 34 27  ALT 17 20 18   ALKPHOS 240* 260* 251*  BILITOT 1.6* 1.3* 1.2  PROT 6.5 6.9 6.5  ALBUMIN 2.5* 2.6* 2.6*   No results for input(s): LIPASE, AMYLASE in the last 168 hours. No results for input(s): AMMONIA in the last 168  hours. Coagulation Profile: No results for input(s): INR, PROTIME in the last 168 hours. Cardiac Enzymes: No results for input(s): CKTOTAL, CKMB, CKMBINDEX, TROPONINI in the last 168 hours. BNP (last 3 results) No results for input(s): PROBNP in the last 8760 hours. HbA1C: No results for input(s): HGBA1C in the last 72 hours. CBG: No results for input(s): GLUCAP in the last 168 hours. Lipid Profile: No results for input(s): CHOL, HDL, LDLCALC, TRIG, CHOLHDL, LDLDIRECT in the last 72 hours. Thyroid Function Tests: No results for input(s): TSH, T4TOTAL, FREET4, T3FREE, THYROIDAB in the last 72 hours. Anemia Panel: No results for input(s): VITAMINB12, FOLATE, FERRITIN, TIBC, IRON, RETICCTPCT in the last 72 hours. Sepsis  Labs: Recent Labs  Lab 08/15/19 1759 08/15/19 2110 08/15/19 2314 08/16/19 0708 08/17/19 0535  PROCALCITON  --   --  <0.10 <0.10 <0.10  LATICACIDVEN 2.3* 1.8 1.4  --   --     Recent Results (from the past 240 hour(s))  CULTURE, BLOOD (ROUTINE X 2) w Reflex to ID Panel     Status: None   Collection Time: 08/15/19 11:14 PM   Specimen: BLOOD  Result Value Ref Range Status   Specimen Description BLOOD BLOOD RIGHT WRIST  Final   Special Requests   Final    BOTTLES DRAWN AEROBIC AND ANAEROBIC Blood Culture results may not be optimal due to an excessive volume of blood received in culture bottles   Culture   Final    NO GROWTH 5 DAYS Performed at [facility], [address] [place], [place] [address]    Report Status 08/20/2019 FINAL  Final  CULTURE, BLOOD (ROUTINE X 2) w Reflex to ID Panel     Status: None   Collection Time: 08/15/19 11:21 PM   Specimen: BLOOD  Result Value Ref Range Status   Specimen Description BLOOD BLOOD RIGHT FOREARM  Final   Special Requests   Final    BOTTLES DRAWN AEROBIC AND ANAEROBIC Blood Culture adequate volume   Culture   Final    NO GROWTH 5 DAYS Performed at [facility], [address] [place],  [place] [address]    Report Status 08/20/2019 FINAL  Final         Radiology Studies: No results found.      Scheduled Meds: . aspirin EC  81 mg Oral Daily  . atorvastatin  20 mg Oral Daily  . carvedilol  3.125 mg Oral BID WC  . dextromethorphan-guaiFENesin  1 tablet Oral BID  . feeding supplement (ENSURE ENLIVE)  237 mL Oral BID BM  . ferrous sulfate  325 mg Oral Q breakfast  . FLUoxetine  20 mg Oral Daily  . gabapentin  300 mg Oral TID  . magnesium oxide  400 mg Oral Daily  . midodrine  2.5 mg Oral TID WC  . multivitamin with minerals  1 tablet Oral Daily  . rivaroxaban  20 mg Oral Q supper  . sodium chloride flush  3 mL Intravenous Once  . sodium chloride flush  3 mL Intravenous Q12H  . torsemide  10 mg Oral Daily   Continuous Infusions:   LOS: 24 days    Time spent: 25 minutes    Barb Merino, MD [facility] Pager [phone]

## 2019-08-21 DIAGNOSIS — I5041 Acute combined systolic (congestive) and diastolic (congestive) heart failure: Secondary | ICD-10-CM | POA: Diagnosis not present

## 2019-08-21 MED ORDER — CALCIUM CARBONATE ANTACID 500 MG PO CHEW
1.0000 | CHEWABLE_TABLET | Freq: Three times a day (TID) | ORAL | Status: DC
  Administered 2019-08-21 – 2019-08-28 (×4): 200 mg via ORAL
  Filled 2019-08-21 (×15): qty 1

## 2019-08-21 NOTE — Progress Notes (Signed)
PROGRESS NOTE    Katelyn Lane  MPN:[id number] DOB: [birth year] DOA: 07/26/2019 PCP: [name], MD    Brief Narrative:   64 year old female with known history of chronic systolic heart failure, recent COVID-19 infection, hyperlipidemia and hypertension, stage IIIb chronic kidney disease presented to the emergency room 07/27/2019 with acute onset of worsening dyspnea associated with dry cough wheezing, orthopnea and PND as well lower extremity edema for 1 week.  Patient had previous admissions with noncompliance.  She smokes cocaine and she is unfortunately homeless. Admitted for acute on chronic systolic and diastolic CHF and AKI on sage IIIb CKD. The patient had a 2D echo on 06/22/2019 that revealed an EF of 25 to 30% with grade 3 diastolic dysfunction, severe left atrial and right atrial dilatation as well as mild mitral regurgitation. Chest x-ray showed cardiomegaly and interstitial pulmonary edema. Patient is 25 days in the hospital today due to disposition problems.  Assessment & Plan:   Active Problems:   Hyperlipidemia   Depression   CKD (chronic kidney disease), stage III   Acute metabolic encephalopathy   History of substance abuse (HCC)   Chronic obstructive pulmonary disease (HCC)   Pulmonary hypertension (HCC)   Acute on chronic combined systolic (congestive) and diastolic (congestive) heart failure (HCC)   History of anemia due to chronic kidney disease   Prolonged QT interval   Acute CHF (congestive heart failure) (HCC)  Acute on chronic combined congestive heart failure: Patient was treated with aggressive IV diuresis, IV Lasix drip.  -13 L, -16 kg weight loss.  Euvolemic now. Renal function fluctuates but is stable. Continue Coreg and Aldactone.  Entresto on hold due to fluctuating renal functions.  Cardiology and nephrology were following,now signed off. Blood pressures low normal.  Started on midodrine along with Coreg and Aldactone. torsemide 10 mg daily  and tolerating well. Now euvolemic.  Acute kidney injury on chronic kidney disease stage IIIb: AKI present on admission secondary to cardiorenal syndrome.  Improved with diuresis. Creatinine fairly stable today.  Anemia of chronic disease/iron deficiency anemia: Stable.  On oral iron. Received multiple doses of IV iron in the hospital.  Nonsustained V. tach: Electrolyte replaced.  Currently stable.  Acute metabolic encephalopathy: Fluctuating mental status.  No neurological deficit.  Underlying psychiatric disorder, anxiety, drug use, cocaine use disorder.  Seen by psychiatry.  They recommended to continue Prozac and as needed Xanax.  Discontinue all opiates. Mental status is stable.  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  Social: Drug abuse and homelessness.  Medically complicated.  Waiting for a skilled nursing facility availability.   DVT prophylaxis:  rivaroxaban (XARELTO) tablet 20 mg   Code Status: Full code. Family Communication: Sister [name] on the phone 8/10. Disposition Plan: Status is: Inpatient  Remains inpatient appropriate because:Unsafe d/c plan   Dispo:  Patient From:  Street.  Planned Disposition: SNF  Expected discharge date: when bed available.  Medically stable for discharge:  Yes.     Consultants:   Cardiology  Nephrology  Procedures:   None  Antimicrobials:   None   Subjective:  Seen and examined. Full of sense of humor. Working with therapy and states that "I need to get out of here"  Objective: Vitals:   08/20/19 1500 08/20/19 2005 08/21/19 0534 08/21/19 1307  BP:  (!) 131/96 101/85 (!) 113/94  Pulse:  70 74 71  Resp:  (!) 24 18 20   Temp:  98.6 F (37 C) (!) 97.5 F (36.4 C) 98.4 F (36.9  C)  TempSrc:  Oral Oral Axillary  SpO2:  94% 99% 100%  Weight: 96.3 kg     Height:        Intake/Output Summary (Last 24 hours) at 08/21/2019 1647 Last data filed at 08/21/2019 1355 Gross per 24 hour  Intake 460 ml    Output --  Net 460 ml   Filed Weights   08/15/19 0536 08/16/19 0455 08/20/19 1500  Weight: 89.4 kg 89 kg 96.3 kg    Examination:  Physical Exam Constitutional:      Comments: Chronically sick looking.  Not in any distress.  On room air.  HENT:     Head: Normocephalic.  Cardiovascular:     Rate and Rhythm: Regular rhythm.     Heart sounds: Normal heart sounds.  Pulmonary:     Breath sounds: Normal breath sounds.  Musculoskeletal:     Comments: Bilateral lower extremity edema 1+.  Mostly chronic.  Neurological:     General: No focal deficit present.     Mental Status: She is alert and oriented to person, place, and time.       Data Reviewed: I have personally reviewed following labs and imaging studies  CBC: Recent Labs  Lab 08/15/19 1740 08/16/19 2126 08/17/19 0535 08/20/19 0525  WBC 7.2   6.9 6.0 5.5 6.3  NEUTROABS 5.0   PENDING  --  3.4  --   HGB 10.0*   9.5* 9.2* 9.1* 10.3*  HCT 33.7*   32.6* 32.5* 30.8* 35.0*  MCV 74.2*   74.6* 77.4* 74.8* 77.3*  PLT 685*   639* 608* 618* 893*   Basic Metabolic Panel: Recent Labs  Lab 08/15/19 1740 08/16/19 2126 08/17/19 0535 08/20/19 0525  NA 137 137 139  --   K 4.2 4.2 4.2  --   CL 94* 98 99  --   CO2 28 28 30   --   GLUCOSE 89 104* 105*  --   BUN 40* 40* 39*  --   CREATININE 1.78* 1.66* 1.74* 1.89*  CALCIUM 9.2 8.8* 9.3  --   MG 2.3 2.4  --   --   PHOS 3.7  --  3.9  --    GFR: Estimated Creatinine Clearance: 36.3 mL/min (A) (by C-G formula based on SCr of 1.89 mg/dL (H)). Liver Function Tests: Recent Labs  Lab 08/15/19 1740 08/17/19 0535  AST 34 27  ALT 20 18  ALKPHOS 260* 251*  BILITOT 1.3* 1.2  PROT 6.9 6.5  ALBUMIN 2.6* 2.6*   No results for input(s): LIPASE, AMYLASE in the last 168 hours. No results for input(s): AMMONIA in the last 168 hours. Coagulation Profile: No results for input(s): INR, PROTIME in the last 168 hours. Cardiac Enzymes: No results for input(s): CKTOTAL, CKMB, CKMBINDEX,  TROPONINI in the last 168 hours. BNP (last 3 results) No results for input(s): PROBNP in the last 8760 hours. HbA1C: No results for input(s): HGBA1C in the last 72 hours. CBG: No results for input(s): GLUCAP in the last 168 hours. Lipid Profile: No results for input(s): CHOL, HDL, LDLCALC, TRIG, CHOLHDL, LDLDIRECT in the last 72 hours. Thyroid Function Tests: No results for input(s): TSH, T4TOTAL, FREET4, T3FREE, THYROIDAB in the last 72 hours. Anemia Panel: No results for input(s): VITAMINB12, FOLATE, FERRITIN, TIBC, IRON, RETICCTPCT in the last 72 hours. Sepsis Labs: Recent Labs  Lab 08/15/19 1759 08/15/19 2110 08/15/19 2314 08/16/19 0708 08/17/19 0535  PROCALCITON  --   --  <0.10 <0.10 <0.10  LATICACIDVEN 2.3* 1.8 1.4  --   --  Recent Results (from the past 240 hour(s))  CULTURE, BLOOD (ROUTINE X 2) w Reflex to ID Panel     Status: None   Collection Time: 08/15/19 11:14 PM   Specimen: BLOOD  Result Value Ref Range Status   Specimen Description BLOOD BLOOD RIGHT WRIST  Final   Special Requests   Final    BOTTLES DRAWN AEROBIC AND ANAEROBIC Blood Culture results may not be optimal due to an excessive volume of blood received in culture bottles   Culture   Final    NO GROWTH 5 DAYS Performed at [facility], [address] [place], [place] [address]    Report Status 08/20/2019 FINAL  Final  CULTURE, BLOOD (ROUTINE X 2) w Reflex to ID Panel     Status: None   Collection Time: 08/15/19 11:21 PM   Specimen: BLOOD  Result Value Ref Range Status   Specimen Description BLOOD BLOOD RIGHT FOREARM  Final   Special Requests   Final    BOTTLES DRAWN AEROBIC AND ANAEROBIC Blood Culture adequate volume   Culture   Final    NO GROWTH 5 DAYS Performed at [facility], [address] [place], [place] [address]    Report Status 08/20/2019 FINAL  Final         Radiology Studies: No results found.      Scheduled Meds:  aspirin EC  81 mg Oral  Daily   atorvastatin  20 mg Oral Daily   carvedilol  3.125 mg Oral BID WC   dextromethorphan-guaiFENesin  1 tablet Oral BID   feeding supplement (ENSURE ENLIVE)  237 mL Oral BID BM   ferrous sulfate  325 mg Oral Q breakfast   FLUoxetine  20 mg Oral Daily   gabapentin  300 mg Oral TID   magnesium oxide  400 mg Oral Daily   midodrine  2.5 mg Oral TID WC   multivitamin with minerals  1 tablet Oral Daily   rivaroxaban  20 mg Oral Q supper   sodium chloride flush  3 mL Intravenous Once   sodium chloride flush  3 mL Intravenous Q12H   torsemide  10 mg Oral Daily   Continuous Infusions:   LOS: 25 days    Time spent: 25 minutes    Barb Merino, MD [facility] Pager [phone]

## 2019-08-21 NOTE — Progress Notes (Signed)
Physical Therapy Treatment Patient Details Name: Katelyn Lane MRN: [id number] DOB: 10-01-1955 Today's Date: 08/21/2019    History of Present Illness 64 year old female with known history of chronic systolic heart failure, recent COVID-19 infection, hyperlipidemia and hypertension, stage IIIb chronic kidney disease presented to the emergency room 07/27/2019 with acute onset of worsening dyspnea associated with dry cough wheezing, orthopnea and PND as well lower extremity edema for 1 week.  Patient had previous admissions with noncompliance.  She smokes cocaine and she is unfortunately homeless. Admitted for acute on chronic systolic and diastolic CHF and AKI on sage IIIb CKD. The patient had a 2D echo on 06/22/2019 that revealed an EF of 25 to 30% with grade 3 diastolic dysfunction, severe left atrial and right atrial dilatation as well as mild mitral regurgitation. Chest x-ray showed cardiomegaly and interstitial pulmonary edema.    PT Comments    Pt received seated at EOB and is agreeable to therapy.  Pt performed transfer to standing without much difficulty.  Pt then proceeded to ambulate 200 ft without LOB however noted fatigue.  Pt then transferred back to sitting EOB and was left with alarm set and needs within reach.  Current discharge plans with supervision/assistance 24/7 remain appropriate at this time.  Pt will continue to benefit from skilled therapy in order to address deficits listed below.     Follow Up Recommendations  Supervision/Assistance - 24 hour;Supervision for mobility/OOB     Equipment Recommendations  Rolling walker with 5" wheels    Recommendations for Other Services       Precautions / Restrictions Precautions Precautions: Fall Precaution Comments: High fall    Mobility  Bed Mobility               General bed mobility comments: was seated EOB pre/post session.  Transfers Overall transfer level: Needs assistance Equipment used: Rolling walker (2  wheeled) Transfers: Sit to/from Stand Sit to Stand: Min guard         General transfer comment: CGA for safety  Ambulation/Gait Ambulation/Gait assistance: Min guard Gait Distance (Feet): 240 Feet Assistive device: Rolling walker (2 wheeled) Gait Pattern/deviations: Step-through pattern;Trunk flexed Gait velocity: decreased   General Gait Details: no LOB or unsteadiness however pt does endorse fatigue   Stairs             Wheelchair Mobility    Modified Rankin (Stroke Patients Only)       Balance Overall balance assessment: Needs assistance Sitting-balance support: No upper extremity supported;Feet supported Sitting balance-Leahy Scale: Good Sitting balance - Comments: pt does not have LOB in sitting even with reching outside BOS   Standing balance support: During functional activity;Bilateral upper extremity supported Standing balance-Leahy Scale: Fair Standing balance comment: pt is at high risk but did not have LOB during standing                             Cognition Arousal/Alertness: Awake/alert Behavior During Therapy: WFL for tasks assessed/performed Overall Cognitive Status: History of cognitive impairments - at baseline                                 General Comments: Pt is alert and oriented x 3 but impulsive at times. Is able to follow commands throughout.      Exercises      General Comments        Pertinent Vitals/Pain  Pain Assessment: No/denies pain    Home Living                      Prior Function            PT Goals (current goals can now be found in the care plan section) Acute Rehab PT Goals Patient Stated Goal: " I'm ready to go home" PT Goal Formulation: With patient Time For Goal Achievement: 08/26/19 Potential to Achieve Goals: Good Progress towards PT goals: Progressing toward goals    Frequency    Min 2X/week      PT Plan Current plan remains appropriate     Co-evaluation              AM-PAC PT "6 Clicks" Mobility   Outcome Measure  Help needed turning from your back to your side while in a flat bed without using bedrails?: A Little Help needed moving from lying on your back to sitting on the side of a flat bed without using bedrails?: A Little Help needed moving to and from a bed to a chair (including a wheelchair)?: A Little Help needed standing up from a chair using your arms (e.g., wheelchair or bedside chair)?: A Little Help needed to walk in hospital room?: A Little Help needed climbing 3-5 steps with a railing? : A Little 6 Click Score: 18    End of Session Equipment Utilized During Treatment: Gait belt Activity Tolerance: Patient tolerated treatment well Patient left: with call bell/phone within reach;with nursing/sitter in room;in bed;with bed alarm set Nurse Communication: Mobility status PT Visit Diagnosis: Unsteadiness on feet (R26.81);Muscle weakness (generalized) (M62.81);Difficulty in walking, not elsewhere classified (R26.2)     Time: 1431-1450 PT Time Calculation (min) (ACUTE ONLY): 19 min  Charges:  $Gait Training: 8-22 mins                      Gwenlyn Saran, PT, DPT 08/21/19, 3:57 PM

## 2019-08-22 DIAGNOSIS — G9341 Metabolic encephalopathy: Secondary | ICD-10-CM | POA: Diagnosis not present

## 2019-08-22 DIAGNOSIS — I5041 Acute combined systolic (congestive) and diastolic (congestive) heart failure: Secondary | ICD-10-CM | POA: Diagnosis not present

## 2019-08-22 MED ORDER — TORSEMIDE 20 MG PO TABS
40.0000 mg | ORAL_TABLET | Freq: Every day | ORAL | Status: DC
  Administered 2019-08-23: 40 mg via ORAL
  Filled 2019-08-22: qty 2

## 2019-08-22 NOTE — Progress Notes (Signed)
No IV needed per MD.

## 2019-08-22 NOTE — Progress Notes (Addendum)
   08/21/19 1605  Clinical Encounter Type  Visited With Patient  Visit Type Follow-up  Referral From Chaplain  Consult/Referral To Chaplain  While rounding chaplain stopped by to check on patient. When chaplain arrived patient was sitting up on what chaplain later learned was the commode. Patient said she felt better. Patient appeared to be excited about having walked with walker in the hall. Patient said her feet feel better and pain is going away. After talking for a while, chaplain asked patient about her son, [name]. Chaplain asked about son coming to see her and patient started crying. Patient asked what was wrong and patient said I want to get out, I want him out. Chaplain asked for clarity and patient said her son is in prison for 2nd degree murder and he has been incarcerated for 2 yrs. Patient also said that she wanted to get out, or go out. Chaplain said she would look into that later, but that it was too hot to go out now. Chaplain came back later and told patient that it was still too hot out.

## 2019-08-22 NOTE — Progress Notes (Signed)
PROGRESS NOTE    Katelyn Lane  DJM:[id number] DOB: 07/10/1955 DOA: 07/26/2019 PCP: [name], MD   Chief complaint.  Bilateral lower extremity edema  Brief Narrative:  64 year old female with known history of chronic systolic heart failure, recent COVID-19 infection, hyperlipidemia and hypertension, stage IIIb chronic kidney disease presented to the emergency room 07/27/2019 with acute onset of worsening dyspnea associated with dry cough wheezing, orthopnea and PND as well lower extremity edema for 1 week. Patient had previous admissions with noncompliance. She smokes cocaine and she is unfortunately homeless. Admitted for acute on chronic systolic and diastolic CHF and AKI on sage IIIb CKD. The patient had a 2D echo on 06/22/2019 that revealed an EF of 25 to 30% with grade 3 diastolic dysfunction, severe left atrial and right atrial dilatation as well as mild mitral regurgitation. Chest x-ray showed cardiomegaly and interstitial pulmonary edema. Patient is 26 days in the hospital today due to disposition problems.  8/14.  Patient still has significant anasarca with the severe leg edema.  Increase torsemide to 40 mg daily.  Monitor renal function.  Also obtain right upper quadrant ultrasound to rule out liver cirrhosis.  Assessment & Plan:   Active Problems:   Hyperlipidemia   Depression   CKD (chronic kidney disease), stage III   Acute metabolic encephalopathy   History of substance abuse (HCC)   Chronic obstructive pulmonary disease (HCC)   Pulmonary hypertension (HCC)   Acute on chronic combined systolic (congestive) and diastolic (congestive) heart failure (HCC)   History of anemia due to chronic kidney disease   Prolonged QT interval   Acute CHF (congestive heart failure) ([place])  #1.  Acute on chronic combined systolic and diastolic congestive heart failure. Patient condition has much improved after aggressive IV diuretics.  She still have significant anasarca.   Increase torsemide to 40 mg daily.  Also obtain right upper quadrant ultrasound to rule out liver cirrhosis.    #2.  Acute kidney injury on chronic kidney disease stage IIIb. Renal function still stable.  3.  Anemia of chronic disease with iron deficient anemia. Patient has received multiple doses of the IV iron.  4.  Acute metabolic cephalopathy. Condition had improved.  5.  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  6.  Homeless. We will work with Education officer, museum on Monday to try to find a solution.    DVT prophylaxis: Xarelto Code Status: Full Family Communication: None Disposition Plan:  . Patient came from:            . Anticipated d/c place: ? . Barriers to d/c OR conditions which need to be met to effect a safe d/c:   Consultants:   Nephrology and cardiology  Procedures:  Antimicrobials: None  Subjective: Patient still complaining significant leg edema.  Short of breath much improved.  No paroxysmal nocturnal dyspnea.  No nausea vomiting or diarrhea.  Objective: Vitals:   08/21/19 1307 08/21/19 1953 08/22/19 0456 08/22/19 1149  BP: (!) 113/94 (!) 115/92 (!) 113/94 116/82  Pulse: 71 71 68 65  Resp: 20 20 (!) 24 16  Temp: 98.4 F (36.9 C) (!) 97.5 F (36.4 C) 97.6 F (36.4 C) (!) 97.4 F (36.3 C)  TempSrc: Axillary Oral Oral Oral  SpO2: 100% 100% 100% 99%  Weight:   94.5 kg   Height:       No intake or output data in the 24 hours ending 08/22/19 1404 Filed Weights   08/16/19 0455 08/20/19 1500 08/22/19 0456  Weight: 89  kg 96.3 kg 94.5 kg    Examination:  General exam: Appears calm and comfortable  Respiratory system: Clear to auscultation. Respiratory effort normal. Cardiovascular system: S1 & S2 heard, RRR. No JVD, murmurs, rubs, gallops or clicks.  Gastrointestinal system: Abdomen is nondistended, soft and nontender. No organomegaly or masses felt. Normal bowel sounds heard. Central nervous system: Alert and oriented. No focal neurological  deficits. Extremities: Bilateral lower extremity 2-3+ edema Skin: No rashes, lesions or ulcers Psychiatry:  Mood & affect appropriate.     Data Reviewed: I have personally reviewed following labs and imaging studies  CBC: Recent Labs  Lab 08/15/19 1740 08/16/19 2126 08/17/19 0535 08/20/19 0525  WBC 7.2  6.9 6.0 5.5 6.3  NEUTROABS 5.0  PENDING  --  3.4  --   HGB 10.0*  9.5* 9.2* 9.1* 10.3*  HCT 33.7*  32.6* 32.5* 30.8* 35.0*  MCV 74.2*  74.6* 77.4* 74.8* 77.3*  PLT 685*  639* 608* 618* 086*   Basic Metabolic Panel: Recent Labs  Lab 08/15/19 1740 08/16/19 2126 08/17/19 0535 08/20/19 0525  NA 137 137 139  --   K 4.2 4.2 4.2  --   CL 94* 98 99  --   CO2 '28 28 30  '$ --   GLUCOSE 89 104* 105*  --   BUN 40* 40* 39*  --   CREATININE 1.78* 1.66* 1.74* 1.89*  CALCIUM 9.2 8.8* 9.3  --   MG 2.3 2.4  --   --   PHOS 3.7  --  3.9  --    GFR: Estimated Creatinine Clearance: 36 mL/min (A) (by C-G formula based on SCr of 1.89 mg/dL (H)). Liver Function Tests: Recent Labs  Lab 08/15/19 1740 08/17/19 0535  AST 34 27  ALT 20 18  ALKPHOS 260* 251*  BILITOT 1.3* 1.2  PROT 6.9 6.5  ALBUMIN 2.6* 2.6*   No results for input(s): LIPASE, AMYLASE in the last 168 hours. No results for input(s): AMMONIA in the last 168 hours. Coagulation Profile: No results for input(s): INR, PROTIME in the last 168 hours. Cardiac Enzymes: No results for input(s): CKTOTAL, CKMB, CKMBINDEX, TROPONINI in the last 168 hours. BNP (last 3 results) No results for input(s): PROBNP in the last 8760 hours. HbA1C: No results for input(s): HGBA1C in the last 72 hours. CBG: No results for input(s): GLUCAP in the last 168 hours. Lipid Profile: No results for input(s): CHOL, HDL, LDLCALC, TRIG, CHOLHDL, LDLDIRECT in the last 72 hours. Thyroid Function Tests: No results for input(s): TSH, T4TOTAL, FREET4, T3FREE, THYROIDAB in the last 72 hours. Anemia Panel: No results for input(s): VITAMINB12, FOLATE,  FERRITIN, TIBC, IRON, RETICCTPCT in the last 72 hours. Sepsis Labs: Recent Labs  Lab 08/15/19 1759 08/15/19 2110 08/15/19 2314 08/16/19 0708 08/17/19 0535  PROCALCITON  --   --  <0.10 <0.10 <0.10  LATICACIDVEN 2.3* 1.8 1.4  --   --     Recent Results (from the past 240 hour(s))  CULTURE, BLOOD (ROUTINE X 2) w Reflex to ID Panel     Status: None   Collection Time: 08/15/19 11:14 PM   Specimen: BLOOD  Result Value Ref Range Status   Specimen Description BLOOD BLOOD RIGHT WRIST  Final   Special Requests   Final    BOTTLES DRAWN AEROBIC AND ANAEROBIC Blood Culture results may not be optimal due to an excessive volume of blood received in culture bottles   Culture   Final    NO GROWTH 5 DAYS Performed at [facility]  [facility], [address] [place], [place] [address]    Report Status 08/20/2019 FINAL  Final  CULTURE, BLOOD (ROUTINE X 2) w Reflex to ID Panel     Status: None   Collection Time: 08/15/19 11:21 PM   Specimen: BLOOD  Result Value Ref Range Status   Specimen Description BLOOD BLOOD RIGHT FOREARM  Final   Special Requests   Final    BOTTLES DRAWN AEROBIC AND ANAEROBIC Blood Culture adequate volume   Culture   Final    NO GROWTH 5 DAYS Performed at [facility], [address], [place],  [address]    Report Status 08/20/2019 FINAL  Final         Radiology Studies: No results found.      Scheduled Meds: . aspirin EC  81 mg Oral Daily  . atorvastatin  20 mg Oral Daily  . calcium carbonate  1 tablet Oral TID WC  . carvedilol  3.125 mg Oral BID WC  . dextromethorphan-guaiFENesin  1 tablet Oral BID  . feeding supplement (ENSURE ENLIVE)  237 mL Oral BID BM  . ferrous sulfate  325 mg Oral Q breakfast  . FLUoxetine  20 mg Oral Daily  . gabapentin  300 mg Oral TID  . magnesium oxide  400 mg Oral Daily  . midodrine  2.5 mg Oral TID WC  . multivitamin with minerals  1 tablet Oral Daily  . rivaroxaban  20 mg Oral Q supper  . sodium chloride  flush  3 mL Intravenous Once  . sodium chloride flush  3 mL Intravenous Q12H  . [START ON 08/23/2019] torsemide  40 mg Oral Daily   Continuous Infusions:   LOS: 26 days    Time spent: 28 minutes    Sharen Hones, MD [facility]   To contact the attending provider between 7A-7P or the covering provider during after hours 7P-7A, please log into the web site [email or web address] and access using universal [place] password for that web site. If you do not have the password, please call the hospital operator.  08/22/2019, 2:04 PM

## 2019-08-23 ENCOUNTER — Inpatient Hospital Stay: Payer: Medicaid Other

## 2019-08-23 DIAGNOSIS — I5043 Acute on chronic combined systolic (congestive) and diastolic (congestive) heart failure: Secondary | ICD-10-CM | POA: Diagnosis not present

## 2019-08-23 DIAGNOSIS — N1832 Chronic kidney disease, stage 3b: Secondary | ICD-10-CM | POA: Diagnosis not present

## 2019-08-23 DIAGNOSIS — G9341 Metabolic encephalopathy: Secondary | ICD-10-CM | POA: Diagnosis not present

## 2019-08-23 LAB — CBC
HCT: 32.6 % — ABNORMAL LOW (ref 36.0–46.0)
Hemoglobin: 9.8 g/dL — ABNORMAL LOW (ref 12.0–15.0)
MCH: 23 pg — ABNORMAL LOW (ref 26.0–34.0)
MCHC: 30.1 g/dL (ref 30.0–36.0)
MCV: 76.5 fL — ABNORMAL LOW (ref 80.0–100.0)
Platelets: 468 10*3/uL — ABNORMAL HIGH (ref 150–400)
RBC: 4.26 MIL/uL (ref 3.87–5.11)
RDW: 26.9 % — ABNORMAL HIGH (ref 11.5–15.5)
WBC: 6.1 10*3/uL (ref 4.0–10.5)
nRBC: 0 % (ref 0.0–0.2)

## 2019-08-23 LAB — BASIC METABOLIC PANEL
Anion gap: 13 (ref 5–15)
BUN: 43 mg/dL — ABNORMAL HIGH (ref 8–23)
CO2: 27 mmol/L (ref 22–32)
Calcium: 9.3 mg/dL (ref 8.9–10.3)
Chloride: 99 mmol/L (ref 98–111)
Creatinine, Ser: 1.96 mg/dL — ABNORMAL HIGH (ref 0.44–1.00)
GFR calc Af Amer: 31 mL/min — ABNORMAL LOW (ref >60)
GFR calc non Af Amer: 27 mL/min — ABNORMAL LOW (ref >60)
Glucose, Bld: 86 mg/dL (ref 70–99)
Potassium: 4.4 mmol/L (ref 3.5–5.1)
Sodium: 139 mmol/L (ref 135–145)

## 2019-08-23 LAB — MAGNESIUM: Magnesium: 2.6 mg/dL — ABNORMAL HIGH (ref 1.7–2.4)

## 2019-08-23 MED ORDER — TORSEMIDE 20 MG PO TABS
20.0000 mg | ORAL_TABLET | Freq: Every day | ORAL | Status: DC
  Administered 2019-08-24: 20 mg via ORAL
  Filled 2019-08-23: qty 1

## 2019-08-23 NOTE — Progress Notes (Addendum)
PROGRESS NOTE    Katelyn Lane  UUV:[id number] DOB: [birth year] DOA: 07/26/2019 PCP: [name], MD   Chief complaint.  Shortness of breath. Brief Narrative:  64 year old female with known history of chronic systolic heart failure, recent COVID-19 infection, hyperlipidemia and hypertension, stage IIIb chronic kidney disease presented to the emergency room 07/27/2019 with acute onset of worsening dyspnea associated with dry cough wheezing, orthopnea and PND as well lower extremity edema for 1 week. Patient had previous admissions with noncompliance. She smokes cocaine and she is unfortunately homeless. Admitted for acute on chronic systolic and diastolic CHF and AKI on sage IIIb CKD. The patient had a 2D echo on 06/22/2019 that revealed an EF of 25 to 30% with grade 3 diastolic dysfunction, severe left atrial and right atrial dilatation as well as mild mitral regurgitation. Chest x-ray showed cardiomegaly and interstitial pulmonary edema. Patient is 26days in the hospital today due to disposition problems.  8/14.  Patient still has significant anasarca with the severe leg edema.  Increase torsemide to 40 mg daily.  Monitor renal function.  Also obtain right upper quadrant ultrasound to rule out liver cirrhosis.  8/15.  Discussed with social worker, patient has been refused all to help from the hospital.  We will evaluate her decision making capacity.  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  Torsemide back to 20 mg daily as edema is better today.   Assessment & Plan:   Active Problems:   Hyperlipidemia   Depression   CKD (chronic kidney disease), stage III   Acute metabolic encephalopathy   History of substance abuse (HCC)   Chronic obstructive pulmonary disease (HCC)   Pulmonary hypertension (HCC)   Acute on chronic combined systolic (congestive) and diastolic (congestive) heart failure (HCC)   History of anemia due to chronic kidney disease    Prolonged QT interval   Acute CHF (congestive heart failure) ([place])  #1.  Acute on chronic combined systolic and diastolic congestive heart failure. Condition seem to be stable, still has some leg edema, but there is no significant short of breath.  2.  Acute kidney injury on chronic kidney disease stage IIIb. Renal function slowly getting worse over the last few days.  Check a BMP tomorrow.  3.  Anemia of chronic disease with iron deficient anemia. Received multiple IV iron's.  4.  Metabolic encephalopathy. Condition had improved.  5.  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   DVT prophylaxis: Xarelto Code Status: Full Family Communication: None Disposition Plan:   Patient came from:                                                                                                                          Anticipated d/c place: ?  Barriers to d/c OR conditions which need to be met to effect a safe d/c: Discussed with Education officer, museum, it has been very difficult to place patient.  Patient has not been cooperating with the application for Medicaid.  She is not strong enough to go to homeless shelter; she has been requiring physical therapy.   Consultants:   Nephrology and cardiology  Procedures:  Antimicrobials: None   Subjective: Patient is on 2 L oxygen today, but he does not have a segment short of breath.  She has good appetite without nausea vomiting.  Leg edema seem to be improving.  Objective: Vitals:   08/22/19 2116 08/23/19 0348 08/23/19 0822 08/23/19 1138  BP: (!) 116/92 (!) 124/97 120/88 93/82  Pulse: 74 83 77 66  Resp: '16 20  16  '$ Temp: (!) 97.5 F (36.4 C) 97.6 F (36.4 C)  (!) 97.5 F (36.4 C)  TempSrc: Oral Oral    SpO2: 91% 96% 100% 100%  Weight:      Height:        Intake/Output Summary (Last 24 hours) at 08/23/2019 1341 Last data filed at 08/23/2019 0900 Gross per 24 hour  Intake 120 ml  Output 0 ml  Net 120 ml   Filed Weights    08/16/19 0455 08/20/19 1500 08/22/19 0456  Weight: 89 kg 96.3 kg 94.5 kg    Examination:  General exam: Appears calm and comfortable  Respiratory system: Clear to auscultation. Respiratory effort normal. Cardiovascular system: S1 & S2 heard, RRR. No JVD, murmurs, rubs, gallops or clicks.  Gastrointestinal system: Abdomen is nondistended, soft and nontender. No organomegaly or masses felt. Normal bowel sounds heard. Central nervous system: Alert and oriented. No focal neurological deficits. Extremities: Symmetric. 1-2+ leg edema. Skin: No rashes, lesions or ulcers Psychiatry: Mood & affect appropriate.     Data Reviewed: I have personally reviewed following labs and imaging studies  CBC: Recent Labs  Lab 08/16/19 2126 08/17/19 0535 08/20/19 0525 08/23/19 0941  WBC 6.0 5.5 6.3 6.1  NEUTROABS  --  3.4  --   --   HGB 9.2* 9.1* 10.3* 9.8*  HCT 32.5* 30.8* 35.0* 32.6*  MCV 77.4* 74.8* 77.3* 76.5*  PLT 608* 618* 582* 161*   Basic Metabolic Panel: Recent Labs  Lab 08/16/19 2126 08/17/19 0535 08/20/19 0525 08/23/19 0941  NA 137 139  --  139  K 4.2 4.2  --  4.4  CL 98 99  --  99  CO2 28 30  --  27  GLUCOSE 104* 105*  --  86  BUN 40* 39*  --  43*  CREATININE 1.66* 1.74* 1.89* 1.96*  CALCIUM 8.8* 9.3  --  9.3  MG 2.4  --   --  2.6*  PHOS  --  3.9  --   --    GFR: Estimated Creatinine Clearance: 34.7 mL/min (A) (by C-G formula based on SCr of 1.96 mg/dL (H)). Liver Function Tests: Recent Labs  Lab 08/17/19 0535  AST 27  ALT 18  ALKPHOS 251*  BILITOT 1.2  PROT 6.5  ALBUMIN 2.6*   No results for input(s): LIPASE, AMYLASE in the last 168 hours. No results for input(s): AMMONIA in the last 168 hours. Coagulation Profile: No results for input(s): INR, PROTIME in the last 168 hours. Cardiac Enzymes: No results for input(s): CKTOTAL, CKMB, CKMBINDEX, TROPONINI in the last 168 hours. BNP (last 3 results) No results for input(s): PROBNP in the last 8760  hours. HbA1C: No results for input(s): HGBA1C in the last 72 hours. CBG: No results for input(s): GLUCAP in the last 168 hours. Lipid Profile: No results for input(s): CHOL, HDL, LDLCALC, TRIG, CHOLHDL, LDLDIRECT in the last 72 hours. Thyroid Function Tests: No results for input(s):  TSH, T4TOTAL, FREET4, T3FREE, THYROIDAB in the last 72 hours. Anemia Panel: No results for input(s): VITAMINB12, FOLATE, FERRITIN, TIBC, IRON, RETICCTPCT in the last 72 hours. Sepsis Labs: Recent Labs  Lab 08/17/19 0535  PROCALCITON <0.10    Recent Results (from the past 240 hour(s))  CULTURE, BLOOD (ROUTINE X 2) w Reflex to ID Panel     Status: None   Collection Time: 08/15/19 11:14 PM   Specimen: BLOOD  Result Value Ref Range Status   Specimen Description BLOOD BLOOD RIGHT WRIST  Final   Special Requests   Final    BOTTLES DRAWN AEROBIC AND ANAEROBIC Blood Culture results may not be optimal due to an excessive volume of blood received in culture bottles   Culture   Final    NO GROWTH 5 DAYS Performed at [facility], [address] [place], [place] [address]    Report Status 08/20/2019 FINAL  Final  CULTURE, BLOOD (ROUTINE X 2) w Reflex to ID Panel     Status: None   Collection Time: 08/15/19 11:21 PM   Specimen: BLOOD  Result Value Ref Range Status   Specimen Description BLOOD BLOOD RIGHT FOREARM  Final   Special Requests   Final    BOTTLES DRAWN AEROBIC AND ANAEROBIC Blood Culture adequate volume   Culture   Final    NO GROWTH 5 DAYS Performed at [facility], [address] [place], [place] [address]    Report Status 08/20/2019 FINAL  Final         Radiology Studies: US Abdomen Limited RUQ  Result Date: 08/23/2019 CLINICAL DATA:  Possible cirrhosis EXAM: ULTRASOUND ABDOMEN LIMITED RIGHT UPPER QUADRANT COMPARISON:  08/15/2010 FINDINGS: Gallbladder: Gallbladder is well distended. Mild wall thickening is noted likely accentuated by the ascites present. No  cholelithiasis is noted. Negative sonographic Murphy's sign is elicited. Common bile duct: Diameter: 2.5 mm. Liver: Mild nodularity is noted. Increased echogenicity is seen. These changes are likely related to underlying cirrhosis. No focal mass is noted. Portal vein is patent on color Doppler imaging with normal direction of blood flow towards the liver. Other: Mild ascites is noted. IMPRESSION: Mild wall thickening likely related to the underlying mild ascites. Changes consistent with mild cirrhosis of the liver. Electronically Signed   By: Inez Catalina M.D.   On: 08/23/2019 09:27        Scheduled Meds: . aspirin EC  81 mg Oral Daily  . atorvastatin  20 mg Oral Daily  . calcium carbonate  1 tablet Oral TID WC  . carvedilol  3.125 mg Oral BID WC  . dextromethorphan-guaiFENesin  1 tablet Oral BID  . feeding supplement (ENSURE ENLIVE)  237 mL Oral BID BM  . ferrous sulfate  325 mg Oral Q breakfast  . FLUoxetine  20 mg Oral Daily  . gabapentin  300 mg Oral TID  . magnesium oxide  400 mg Oral Daily  . midodrine  2.5 mg Oral TID WC  . multivitamin with minerals  1 tablet Oral Daily  . rivaroxaban  20 mg Oral Q supper  . sodium chloride flush  3 mL Intravenous Once  . sodium chloride flush  3 mL Intravenous Q12H  . [START ON 08/24/2019] torsemide  20 mg Oral Daily   Continuous Infusions:   LOS: 27 days    Time spent: 28 minutes    Sharen Hones, MD [facility]   To contact the attending provider between 7A-7P or the covering provider during after hours 7P-7A, please  log into the web site [email or web address] and access using universal [place] password for that web site. If you do not have the password, please call the hospital operator.  08/23/2019, 1:41 PM

## 2019-08-24 DIAGNOSIS — I5041 Acute combined systolic (congestive) and diastolic (congestive) heart failure: Secondary | ICD-10-CM | POA: Diagnosis not present

## 2019-08-24 DIAGNOSIS — G9341 Metabolic encephalopathy: Secondary | ICD-10-CM | POA: Diagnosis not present

## 2019-08-24 DIAGNOSIS — N1832 Chronic kidney disease, stage 3b: Secondary | ICD-10-CM | POA: Diagnosis not present

## 2019-08-24 LAB — BASIC METABOLIC PANEL
Anion gap: 11 (ref 5–15)
BUN: 40 mg/dL — ABNORMAL HIGH (ref 8–23)
CO2: 27 mmol/L (ref 22–32)
Calcium: 9.1 mg/dL (ref 8.9–10.3)
Chloride: 101 mmol/L (ref 98–111)
Creatinine, Ser: 1.93 mg/dL — ABNORMAL HIGH (ref 0.44–1.00)
GFR calc Af Amer: 31 mL/min — ABNORMAL LOW (ref >60)
GFR calc non Af Amer: 27 mL/min — ABNORMAL LOW (ref >60)
Glucose, Bld: 88 mg/dL (ref 70–99)
Potassium: 3.7 mmol/L (ref 3.5–5.1)
Sodium: 139 mmol/L (ref 135–145)

## 2019-08-24 MED ORDER — BENZTROPINE MESYLATE 0.5 MG PO TABS
0.2500 mg | ORAL_TABLET | Freq: Two times a day (BID) | ORAL | Status: DC
  Administered 2019-08-24 – 2019-09-02 (×18): 0.25 mg via ORAL
  Filled 2019-08-24 (×20): qty 1

## 2019-08-24 MED ORDER — FLUOXETINE HCL 10 MG PO CAPS
30.0000 mg | ORAL_CAPSULE | Freq: Every day | ORAL | Status: DC
  Administered 2019-08-25 – 2019-09-02 (×9): 30 mg via ORAL
  Filled 2019-08-24 (×9): qty 3

## 2019-08-24 MED ORDER — THIOTHIXENE 1 MG PO CAPS
1.0000 mg | ORAL_CAPSULE | Freq: Two times a day (BID) | ORAL | Status: DC
  Administered 2019-08-24 – 2019-09-02 (×18): 1 mg via ORAL
  Filled 2019-08-24 (×19): qty 1

## 2019-08-24 MED ORDER — TORSEMIDE 20 MG PO TABS
20.0000 mg | ORAL_TABLET | Freq: Two times a day (BID) | ORAL | Status: DC
  Administered 2019-08-24 – 2019-09-02 (×18): 20 mg via ORAL
  Filled 2019-08-24 (×20): qty 1

## 2019-08-24 NOTE — Progress Notes (Signed)
Occupational Therapy Treatment Patient Details Name: Katelyn Lane MRN: [id number] DOB: 1955-07-31 Today's Date: 08/24/2019    History of present illness 64 year old female with known history of chronic systolic heart failure, recent COVID-19 infection, hyperlipidemia and hypertension, stage IIIb chronic kidney disease presented to the emergency room 07/27/2019 with acute onset of worsening dyspnea associated with dry cough wheezing, orthopnea and PND as well lower extremity edema for 1 week.  Patient had previous admissions with noncompliance.  She smokes cocaine and she is unfortunately homeless. Admitted for acute on chronic systolic and diastolic CHF and AKI on sage IIIb CKD. The patient had a 2D echo on 06/22/2019 that revealed an EF of 25 to 30% with grade 3 diastolic dysfunction, severe left atrial and right atrial dilatation as well as mild mitral regurgitation. Chest x-ray showed cardiomegaly and interstitial pulmonary edema.   OT comments  Pt calling therapist in the room and asking to "get to work with therapy". However, pt then declines OOB activities even after maximal encouragement. Pt is seen to be grimacing with movement of B LEs and reports pain but declines medication from RN this session. Pt grabbing theraband from last session and demonstrates exercises from HEP but does still need min cuing for proper technique. Pt reports she has been performing exercises over the weekend. Pt also washing face and hair with set up A from bed level as she declines OOB activities. Pt remains in bed at end of session with all needs within reach and bed alarm activated. Pt continues to benefit from acute OT intervention to address deficits.   Follow Up Recommendations  SNF;Supervision - Intermittent          Precautions / Restrictions Precautions Precautions: Fall              ADL either performed or assessed with clinical judgement   ADL Overall ADL's : Needs assistance/impaired        General ADL Comments: Pt with minimal participation this session. Pt grimacing with pain in bed but refusing pain medication.     Vision Baseline Vision/History: No visual deficits            Cognition Arousal/Alertness: Awake/alert Behavior During Therapy: WFL for tasks assessed/performed Overall Cognitive Status: History of cognitive impairments - at baseline       General Comments: Pt is alert and oriented x 3 but impulsive at times. Is able to follow commands throughout.                   Pertinent Vitals/ Pain       Pain Assessment: Faces Faces Pain Scale: Hurts even more Pain Location: B LEs Pain Descriptors / Indicators: Grimacing;Guarding Pain Intervention(s): Limited activity within patient's tolerance;Monitored during session;Repositioned         Frequency  Min 1X/week        Progress Toward Goals  OT Goals(current goals can now be found in the care plan section)  Progress towards OT goals: Progressing toward goals  Acute Rehab OT Goals Patient Stated Goal: "They say i've been here for a long time and I am ready to go home."  Plan Discharge plan remains appropriate       AM-PAC OT "6 Clicks" Daily Activity     Outcome Measure   Help from another person eating meals?: None Help from another person taking care of personal grooming?: None Help from another person toileting, which includes using toliet, bedpan, or urinal?: A Little Help from another person bathing (including washing,  rinsing, drying)?: A Little Help from another person to put on and taking off regular upper body clothing?: None Help from another person to put on and taking off regular lower body clothing?: A Little 6 Click Score: 21    End of Session    OT Visit Diagnosis: Other abnormalities of gait and mobility (R26.89);Muscle weakness (generalized) (M62.81)   Activity Tolerance Patient tolerated treatment well   Patient Left in bed;with call bell/phone within reach;with  bed alarm set   Nurse Communication Mobility status;Other (comment) (pain but pt declines medication)        Time: 4935-5217 OT Time Calculation (min): 23 min  Charges: OT General Charges $OT Visit: 1 Visit OT Treatments $Self Care/Home Management : 8-22 mins $Therapeutic Activity: 8-22 mins  Darleen Crocker, MS, OTR/L , CBIS ascom [phone]  08/24/19, 10:22 AM

## 2019-08-24 NOTE — Progress Notes (Signed)
[facility] MD Progress Note  08/24/2019 2:34 PM Katelyn Lane  MRN:  [id number] Subjective:   I do not know  Principal Problem: <principal problem not specified> Diagnosis: Active Problems:   Hyperlipidemia   Depression   CKD (chronic kidney disease), stage III   Acute metabolic encephalopathy   History of substance abuse ([place])   Chronic obstructive pulmonary disease (HCC)   Pulmonary hypertension (HCC)   Acute on chronic combined systolic (congestive) and diastolic (congestive) heart failure (HCC)   History of anemia due to chronic kidney disease   Prolonged QT interval   Acute CHF (congestive heart failure) (HCC)  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 CHF (congestive heart failure) ([place])   . COVID-19   . Hyperlipidemia   . Hypertension   . Renal disorder     Past Surgical History:  Procedure Laterality Date  . RIGHT/LEFT HEART CATH AND CORONARY ANGIOGRAPHY N/A 12/30/2018   Procedure: RIGHT/LEFT HEART CATH AND CORONARY ANGIOGRAPHY;  Surgeon: Corey Skains, MD;  Location: [place] [facility];  Service: Cardiovascular;  Laterality: N/A;   Family History:  Family History  Problem Relation Age of Onset  . Breast cancer Neg Hx    Family Psychiatric  History:       Not known  Social History:  Social History   Substance and Sexual Activity  Alcohol Use No  . Alcohol/week: 0.0 standard drinks     Social History   Substance and Sexual Activity  Drug Use Yes  . Types: Cocaine   Comment: 3-4 days ago    Social History   Socioeconomic History  . Marital status: Single     Spouse name: Not on file  . Number of children: Not on file  . Years of education: Not on file  . Highest education level: Not on file  Occupational History  . Not on file  Tobacco Use  . Smoking status: Current Every Day Smoker  . Smokeless tobacco: Never Used  Substance and Sexual Activity  . Alcohol use: No    Alcohol/week: 0.0 standard drinks  . Drug use: Yes    Types: Cocaine    Comment: 3-4 days ago  . Sexual activity: Not on file  Other Topics Concern  . Not on file  Social History Narrative  . Not on file   Social Determinants of Health   Financial Resource Strain:   . Difficulty of Paying Living Expenses:   Food Insecurity:   . Worried About Charity fundraiser in the Last Year:   . Arboriculturist in the Last Year:   Transportation Needs:   . Film/video editor (Medical):   Marland Kitchen Lack of Transportation (Non-Medical):   Physical Activity:   . Days of Exercise per Week:   . Minutes of Exercise per Session:   Stress:   . Feeling of Stress :   Social Connections:   . Frequency of Communication with Friends and Family:   . Frequency of Social Gatherings with Friends and Family:   . Attends Religious Services:   . Active Member of Clubs  or Organizations:   . Attends Archivist Meetings:   Marland Kitchen Marital Status:    Additional Social History:   Homeless.  She does not fully understand the nature of her medical illness and psych issues   When asked again what she is here for she gives partial answers, I do not know answers and also does not understand the implications of not being treated.  She also thinks she has the capacity to live alone in her own housing which is poor judgement insight   Does not have capacity for full consent in general still   Patient needs NH placement if she hopefully qualifies for medicaid dollars as she cannot care successfully for her medical  Issues and her adl's are needing support as well.                                 Sleep:  Says she is having nightmares at times paranoia at night  People might be surrounding her bed   Appetite:   Fair   Current Medications: Current Facility-Administered Medications  Medication Dose Route Frequency Provider Last Rate Last Admin  . acetaminophen (TYLENOL) tablet 650 mg  650 mg Oral Q4H PRN Mansy, Jan A, MD   650 mg at 08/22/19 0036  . albuterol (PROVENTIL) (2.5 MG/3ML) 0.083% nebulizer solution 2.5 mg  2.5 mg Nebulization Q4H PRN Barb Merino, MD      . ALPRAZolam Duanne Moron) tablet 0.25 mg  0.25 mg Oral BID PRN Mansy, Jan A, MD   0.25 mg at 08/20/19 2012  . aspirin EC tablet 81 mg  81 mg Oral Daily Mansy, Jan A, MD   81 mg at 08/24/19 0932  . atorvastatin (LIPITOR) tablet 20 mg  20 mg Oral Daily [name], MD   20 mg at 08/24/19 0932  . benztropine (COGENTIN) tablet 0.25 mg  0.25 mg Oral BID Eulas Post, MD      . calcium carbonate (TUMS - dosed in mg elemental calcium) chewable tablet 200 mg of elemental calcium  1 tablet Oral TID WC Barb Merino, MD   200 mg of elemental calcium at 08/23/19 0819  . carvedilol (COREG) tablet 3.125 mg  3.125 mg Oral BID WC [name], MD   3.125 mg at 08/23/19 0819  . dextromethorphan-guaiFENesin (MUCINEX DM) 30-600 MG per 12 hr tablet 1 tablet  1 tablet Oral BID Nicole Kindred A, DO   1 tablet at 08/23/19 2050  . feeding supplement (ENSURE ENLIVE) (ENSURE ENLIVE) liquid 237 mL  237 mL Oral BID BM Barb Merino, MD   237 mL at 08/23/19 1417  . ferrous sulfate tablet 325 mg  325 mg Oral Q breakfast Nicole Kindred A, DO   325 mg at 08/24/19 5427  . [START ON 08/25/2019] FLUoxetine (PROZAC) capsule 30 mg  30 mg Oral Daily Eulas Post, MD      . gabapentin (NEURONTIN) capsule 300 mg  300 mg Oral TID Terrilee Croak, MD   300 mg at 08/24/19 0932  . HYDROcodone-acetaminophen (NORCO/VICODIN) 5-325 MG per tablet 1-2 tablet  1-2 tablet Oral Q6H PRN Nicole Kindred A, DO   1 tablet at 08/23/19 2242  .  LORazepam (ATIVAN) injection 0.5 mg  0.5 mg Intravenous Q4H PRN Nicole Kindred A, DO   0.5 mg at 08/15/19 1606  . magnesium oxide (MAG-OX) tablet 400 mg  400 mg Oral Daily Nicole Kindred A, DO   400  mg at 08/24/19 0932  . midodrine (PROAMATINE) tablet 2.5 mg  2.5 mg Oral TID WC Nicole Kindred A, DO   2.5 mg at 08/24/19 1209  . multivitamin with minerals tablet 1 tablet  1 tablet Oral Daily Nicole Kindred A, DO   1 tablet at 08/23/19 0819  . rivaroxaban (XARELTO) tablet 20 mg  20 mg Oral Q supper [name], MD   20 mg at 08/23/19 1628  . sodium chloride flush (NS) 0.9 % injection 3 mL  3 mL Intravenous Once Carrie Mew, MD      . sodium chloride flush (NS) 0.9 % injection 3 mL  3 mL Intravenous Q12H Mansy, Jan A, MD   3 mL at 08/23/19 0820  . sodium chloride flush (NS) 0.9 % injection 3 mL  3 mL Intravenous PRN Mansy, Jan A, MD      . thiothixene (NAVANE) capsule 1 mg  1 mg Oral BID Eulas Post, MD      . torsemide [facility]) tablet 20 mg  20 mg Oral BID Sharen Hones, MD        Lab Results:  Results for orders placed or performed during the hospital encounter of 07/26/19 (from the past 48 hour(s))  CBC     Status: Abnormal   Collection Time: 08/23/19  9:41 AM  Result Value Ref Range   WBC 6.1 4.0 - 10.5 K/uL   RBC 4.26 3.87 - 5.11 MIL/uL   Hemoglobin 9.8 (L) 12.0 - 15.0 g/dL   HCT 32.6 (L) 36 - 46 %   MCV 76.5 (L) 80.0 - 100.0 fL   MCH 23.0 (L) 26.0 - 34.0 pg   MCHC 30.1 30.0 - 36.0 g/dL   RDW 26.9 (H) 11.5 - 15.5 %   Platelets 468 (H) 150 - 400 K/uL   nRBC 0.0 0.0 - 0.2 %    Comment: Performed at [facility], [address], [place], [place] [address]  Basic metabolic panel     Status: Abnormal   Collection Time: 08/23/19  9:41 AM  Result Value Ref Range   Sodium 139 135 - 145 mmol/L   Potassium 4.4 3.5 - 5.1 mmol/L   Chloride 99 98 - 111 mmol/L   CO2 27 22 - 32 mmol/L   Glucose, Bld 86 70 - 99 mg/dL    Comment: Glucose reference range applies  only to samples taken after fasting for at least 8 hours.   BUN 43 (H) 8 - 23 mg/dL   Creatinine, Ser 1.96 (H) 0.44 - 1.00 mg/dL   Calcium 9.3 8.9 - 10.3 mg/dL   GFR calc non Af Amer 27 (L) >60 mL/min   GFR calc Af Amer 31 (L) >60 mL/min   Anion gap 13 5 - 15    Comment: Performed at [facility], [address], [place], [place] [id number]  Magnesium     Status: Abnormal   Collection Time: 08/23/19  9:41 AM  Result Value Ref Range   Magnesium 2.6 (H) 1.7 - 2.4 mg/dL    Comment: Performed at [facility], [address] [place], [place] [address]  Basic metabolic panel     Status: Abnormal   Collection Time: 08/24/19  4:19 AM  Result Value Ref Range   Sodium 139 135 - 145 mmol/L   Potassium 3.7 3.5 - 5.1 mmol/L   Chloride 101 98 - 111 mmol/L   CO2 27 22 - 32 mmol/L   Glucose, Bld 88 70 - 99 mg/dL  Comment: Glucose reference range applies only to samples taken after fasting for at least 8 hours.   BUN 40 (H) 8 - 23 mg/dL   Creatinine, Ser 1.93 (H) 0.44 - 1.00 mg/dL   Calcium 9.1 8.9 - 10.3 mg/dL   GFR calc non Af Amer 27 (L) >60 mL/min   GFR calc Af Amer 31 (L) >60 mL/min   Anion gap 11 5 - 15    Comment: Performed at [facility], [address] [place], [place] [address]    Blood Alcohol level:  Lab Results  Component Value Date   [facility] <10 45/36/4680    Metabolic Disorder Labs: Lab Results  Component Value Date   HGBA1C 6.3 (H) 04/30/2019   MPG 134.11 04/30/2019   MPG 125.5 03/07/2019   No results found for: PROLACTIN Lab Results  Component Value Date   CHOL 100 05/22/2019   TRIG 56 05/22/2019   HDL 46 05/22/2019   CHOLHDL 2.2 05/22/2019   VLDL 11 05/22/2019   LDLCALC 43 05/22/2019   LDLCALC 58 04/30/2019    Physical Findings: AIMS:  , ,  ,  ,   not today  CIWA:    COWS:     Musculoskeletal: Strength & Muscle Tone: limited  Gait & Station: needs assistance  Patient leans: not known   Psychiatric Specialty  Exam: Physical Exam  Review of Systems  Blood pressure (!) 112/91, pulse 68, temperature 98.2 F (36.8 C), temperature source Oral, resp. rate 18, height 5\' 7"  (1.702 m), weight 95.7 kg, SpO2 97 %.Body mass index is 33.04 kg/m.    Alert somewhat cooperative strange and odd at baseline Oriented times four Consciousness not clouded or fluctuant Mood and affect slightly anxious Pleasant in general No tics or shakes and tremors uses walker Judgement insight reliability all poor  Limited understanding of med psych issues and the implications of treatment as well as not being treat ed Thought process and content --survival themes,  Paranoia at night  Memory ---not able to fully assess  Intelligence fund of knowledge all poor cocn Concentration and attention all fair to poor  SI and HI contracts for safety  Abstraction limited Rapport okay Eye contact okay Appearance --strange and somewhat odd   ADL's limited needs assistance Cognition reducing  Sleep on and off Recall fair  Handedness IDK Leans IDK Assets  ---willing to go to NH Liabilities --homeless psych social deprivation Language okay --- Akathisia none                                                              S:        Treatment Plan Summary:  Limited capacity for consent as noted before Low dose haldol added along with increase in prozac    Awaits possible NH placement when stable    Eulas Post, MD 08/24/2019, 2:34 PM

## 2019-08-24 NOTE — Progress Notes (Addendum)
   08/24/19 1630  Clinical Encounter Type  Visited With Patient;Health care provider  Visit Type Follow-up  Referral From Chaplain  Consult/Referral To Chaplain  While rounding, nurse stopped chaplain and told her that she took patient outside for a few minutes. Chaplain thanked Marine scientist. Chaplain stopped in to see patient and acknowledged that she was aware that patient had been outside. Patient appeared to still want chaplain to take her out and chaplain reiterated that she had been out already. Chaplain told patient that she would come back and talk with her later.

## 2019-08-24 NOTE — Progress Notes (Signed)
Mobility Specialist - Progress Note   08/24/19 1327  Mobility  Activity Ambulated in hall  Level of Assistance Contact guard assist, steadying assist  Assistive Device Front wheel walker  Distance Ambulated (ft) 90 ft  Mobility Response Tolerated well  Mobility performed by Mobility specialist  $Mobility charge 1 Mobility    Pre-mobility: 71 HR, 114/88 BP, 98% SpO2 Post-mobility: 77 HR, 126/94 BP, 100% SpO2   Pt was sitting EOB brushing her teeth upon arrival. Pt agreed to session. Prior to mobilizing, pt began crying as she started singing a song, stating that she was "thankful and glad". Pt c/o pain in both sides of her lower abdomen "8/10", however pain did not limit mobility. Pt was SBA in sit-to-stand and CGA while ambulating with RW. Pt utilized 2L of O2 during ambulation. Pt ambulated 80', denying any SOB while ambulating. After getting pt back to bed, pt c/o slight SOB. Pt utilizied pursed-lip breathing techniques, O2 sat at 100%. Pt is back in bed after session with phone/call bell in reach. Nurse was notified of performance.   Kathee Delton Mobility Specialist 08/24/19, 2:00 PM

## 2019-08-24 NOTE — Progress Notes (Addendum)
PROGRESS NOTE    Katelyn Lane  ZOX:[id number] DOB: 1955-07-07 DOA: 07/26/2019 PCP: [name], MD   Chief complaint.  Shortness of breath.  Brief Narrative:  64 year old female with known history of chronic systolic heart failure, recent COVID-19 infection, hyperlipidemia and hypertension, stage IIIb chronic kidney disease presented to the emergency room 07/27/2019 with acute onset of worsening dyspnea associated with dry cough wheezing, orthopnea and PND as well lower extremity edema for 1 week. Patient had previous admissions with noncompliance. She smokes cocaine and she is unfortunately homeless. Admitted for acute on chronic systolic and diastolic CHF and AKI on sage IIIb CKD. The patient had a 2D echo on 06/22/2019 that revealed an EF of 25 to 30% with grade 3 diastolic dysfunction, severe left atrial and right atrial dilatation as well as mild mitral regurgitation. Chest x-ray showed cardiomegaly and interstitial pulmonary edema. Patient is 26days in the hospital today due to disposition problems.  8/14. Patient still has significant anasarca with the severe leg edema. Increase torsemide to 40 mg daily. Monitor renal function. Also obtain right upper quadrant ultrasound to rule out liver cirrhosis.  8/15.  Discussed with social worker, patient has been refused all to help from the hospital.  We will evaluate her decision making capacity.  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    8/16.  Leg edema is better, renal function improving after giving 40 mg of torsemide yesterday, continue torsemide at 20 mg twice a day.  Assessment & Plan:   Active Problems:   Hyperlipidemia   Depression   CKD (chronic kidney disease), stage III   Acute metabolic encephalopathy   History of substance abuse (HCC)   Chronic obstructive pulmonary disease (HCC)   Pulmonary hypertension (HCC)   Acute on chronic combined systolic (congestive) and diastolic  (congestive) heart failure (HCC)   History of anemia due to chronic kidney disease   Prolonged QT interval   Acute CHF (congestive heart failure) ([place])  #1.  Acute on chronic combined systolic and diastolic congestive heart failure. Condition improving.  Continue torsemide.  #2 acute kidney injury on chronic kidney disease stage IIIb. Renal function improving after giving increased dose of torsemide.  3.  Anemia of chronic disease with iron deficient anemia. Received multiple doses of IV iron.  4.  Metabolic encephalopathy. Condition had improved.  5.  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   DVT prophylaxis:Xarelto Code Status:Full Family Communication:None Disposition Plan:  Patient came from:  Anticipated d/c place:?  Barriers to d/c OR conditions which need to be met to effect a safe d/c: Patient is deemed not to be able to make her own decision, will need to refer to adult protection agency and appoint a guardian.  Consultants:  Nephrology and cardiology  Procedures: Antimicrobials: None  Subjective: Patient doing well today, leg edema improving.  Denies any short of breath or cough. No abdominal pain or nausea vomiting.  Objective: Vitals:   08/23/19 2044 08/24/19 0431 08/24/19 0526 08/24/19 1207  BP: (!) 113/97  112/88 (!) 112/91  Pulse: 67  66 68  Resp: '20  20 18  '$ Temp: 98.4 F (36.9 C)  98 F (36.7 C) 98.2 F (36.8 C)  TempSrc:   Oral Oral  SpO2: 100%  100% 97%  Weight:  95.7 kg    Height:        Intake/Output Summary (Last 24 hours) at 08/24/2019 1231 Last data filed at 08/24/2019 0955 Gross per 24 hour  Intake 1200 ml  Output --  Net 1200 ml   Filed Weights   08/20/19 1500 08/22/19 0456 08/24/19 0431  Weight: 96.3 kg 94.5 kg 95.7 kg    Examination:  General exam: Appears calm and comfortable   Respiratory system: Clear to auscultation. Respiratory effort normal. Cardiovascular system: S1 & S2 heard, RRR. No JVD, murmurs, rubs, gallops or clicks. 1+ pedal edema. Gastrointestinal system: Abdomen is nondistended, soft and nontender. No organomegaly or masses felt. Normal bowel sounds heard. Central nervous system: Alert and oriented. No focal neurological deficits. Extremities: Symmetric  Skin: No rashes, lesions or ulcers Psychiatry:Mood & affect appropriate.     Data Reviewed: I have personally reviewed following labs and imaging studies  CBC: Recent Labs  Lab 08/20/19 0525 08/23/19 0941  WBC 6.3 6.1  HGB 10.3* 9.8*  HCT 35.0* 32.6*  MCV 77.3* 76.5*  PLT 582* 409*   Basic Metabolic Panel: Recent Labs  Lab 08/20/19 0525 08/23/19 0941 08/24/19 0419  NA  --  139 139  K  --  4.4 3.7  CL  --  99 101  CO2  --  27 27  GLUCOSE  --  86 88  BUN  --  43* 40*  CREATININE 1.89* 1.96* 1.93*  CALCIUM  --  9.3 9.1  MG  --  2.6*  --    GFR: Estimated Creatinine Clearance: 35.4 mL/min (A) (by C-G formula based on SCr of 1.93 mg/dL (H)). Liver Function Tests: No results for input(s): AST, ALT, ALKPHOS, BILITOT, PROT, ALBUMIN in the last 168 hours. No results for input(s): LIPASE, AMYLASE in the last 168 hours. No results for input(s): AMMONIA in the last 168 hours. Coagulation Profile: No results for input(s): INR, PROTIME in the last 168 hours. Cardiac Enzymes: No results for input(s): CKTOTAL, CKMB, CKMBINDEX, TROPONINI in the last 168 hours. BNP (last 3 results) No results for input(s): PROBNP in the last 8760 hours. HbA1C: No results for input(s): HGBA1C in the last 72 hours. CBG: No results for input(s): GLUCAP in the last 168 hours. Lipid Profile: No results for input(s): CHOL, HDL, LDLCALC, TRIG, CHOLHDL, LDLDIRECT in the last 72 hours. Thyroid Function Tests: No results for input(s): TSH, T4TOTAL, FREET4, T3FREE, THYROIDAB in the last 72 hours. Anemia  Panel: No results for input(s): VITAMINB12, FOLATE, FERRITIN, TIBC, IRON, RETICCTPCT in the last 72 hours. Sepsis Labs: No results for input(s): PROCALCITON, LATICACIDVEN in the last 168 hours.  Recent Results (from the past 240 hour(s))  CULTURE, BLOOD (ROUTINE X 2) w Reflex to ID Panel     Status: None   Collection Time: 08/15/19 11:14 PM   Specimen: BLOOD  Result Value Ref Range Status   Specimen Description BLOOD BLOOD RIGHT WRIST  Final   Special Requests   Final    BOTTLES DRAWN AEROBIC AND ANAEROBIC Blood Culture results may not be optimal due to an excessive volume of blood received in culture bottles   Culture   Final    NO GROWTH 5 DAYS Performed at [facility], [address] [place], [place] [address]    Report Status 08/20/2019 FINAL  Final  CULTURE, BLOOD (ROUTINE X 2) w Reflex to ID Panel     Status: None   Collection Time: 08/15/19 11:21 PM   Specimen: BLOOD  Result Value Ref Range Status   Specimen Description BLOOD BLOOD RIGHT FOREARM  Final   Special Requests   Final    BOTTLES DRAWN AEROBIC AND ANAEROBIC Blood Culture adequate volume   Culture  Final    NO GROWTH 5 DAYS Performed at [facility], [address] [place], [place] [address]    Report Status 08/20/2019 FINAL  Final         Radiology Studies: US Abdomen Limited RUQ  Result Date: 08/23/2019 CLINICAL DATA:  Possible cirrhosis EXAM: ULTRASOUND ABDOMEN LIMITED RIGHT UPPER QUADRANT COMPARISON:  08/15/2010 FINDINGS: Gallbladder: Gallbladder is well distended. Mild wall thickening is noted likely accentuated by the ascites present. No cholelithiasis is noted. Negative sonographic Murphy's sign is elicited. Common bile duct: Diameter: 2.5 mm. Liver: Mild nodularity is noted. Increased echogenicity is seen. These changes are likely related to underlying cirrhosis. No focal mass is noted. Portal vein is patent on color Doppler imaging with normal direction of blood flow towards the  liver. Other: Mild ascites is noted. IMPRESSION: Mild wall thickening likely related to the underlying mild ascites. Changes consistent with mild cirrhosis of the liver. Electronically Signed   By: Inez Catalina M.D.   On: 08/23/2019 09:27        Scheduled Meds: . aspirin EC  81 mg Oral Daily  . atorvastatin  20 mg Oral Daily  . calcium carbonate  1 tablet Oral TID WC  . carvedilol  3.125 mg Oral BID WC  . dextromethorphan-guaiFENesin  1 tablet Oral BID  . feeding supplement (ENSURE ENLIVE)  237 mL Oral BID BM  . ferrous sulfate  325 mg Oral Q breakfast  . FLUoxetine  20 mg Oral Daily  . gabapentin  300 mg Oral TID  . magnesium oxide  400 mg Oral Daily  . midodrine  2.5 mg Oral TID WC  . multivitamin with minerals  1 tablet Oral Daily  . rivaroxaban  20 mg Oral Q supper  . sodium chloride flush  3 mL Intravenous Once  . sodium chloride flush  3 mL Intravenous Q12H  . torsemide  20 mg Oral BID   Continuous Infusions:   LOS: 28 days    Time spent: 23 minutes    Sharen Hones, MD [facility]   To contact the attending provider between 7A-7P or the covering provider during after hours 7P-7A, please log into the web site [email or web address] and access using universal [place] password for that web site. If you do not have the password, please call the hospital operator.  08/24/2019, 12:31 PM

## 2019-08-25 DIAGNOSIS — G9341 Metabolic encephalopathy: Secondary | ICD-10-CM | POA: Diagnosis not present

## 2019-08-25 DIAGNOSIS — N1832 Chronic kidney disease, stage 3b: Secondary | ICD-10-CM | POA: Diagnosis not present

## 2019-08-25 DIAGNOSIS — I5041 Acute combined systolic (congestive) and diastolic (congestive) heart failure: Secondary | ICD-10-CM | POA: Diagnosis not present

## 2019-08-25 LAB — BASIC METABOLIC PANEL
Anion gap: 11 (ref 5–15)
BUN: 41 mg/dL — ABNORMAL HIGH (ref 8–23)
CO2: 28 mmol/L (ref 22–32)
Calcium: 9 mg/dL (ref 8.9–10.3)
Chloride: 100 mmol/L (ref 98–111)
Creatinine, Ser: 1.93 mg/dL — ABNORMAL HIGH (ref 0.44–1.00)
GFR calc Af Amer: 31 mL/min — ABNORMAL LOW (ref >60)
GFR calc non Af Amer: 27 mL/min — ABNORMAL LOW (ref >60)
Glucose, Bld: 121 mg/dL — ABNORMAL HIGH (ref 70–99)
Potassium: 3.4 mmol/L — ABNORMAL LOW (ref 3.5–5.1)
Sodium: 139 mmol/L (ref 135–145)

## 2019-08-25 MED ORDER — POTASSIUM CHLORIDE 20 MEQ PO PACK
40.0000 meq | PACK | Freq: Two times a day (BID) | ORAL | Status: AC
  Administered 2019-08-25 (×2): 40 meq via ORAL
  Filled 2019-08-25 (×2): qty 2

## 2019-08-25 NOTE — TOC Progression Note (Signed)
Transition of Care [facility]) - Progression Note    Patient Details  Name: Katelyn Lane MRN: [id number] Date of Birth: 1955/10/04  Transition of [facility]) CM/SW Contact  Beverly Sessions, RN Phone Number: 08/25/2019, 10:41 AM  Clinical Narrative:      Faxed psych notes to [facility] with [facility] at [phone]      Expected Discharge Plan and Services                                                 Social Determinants of Health ([place]) Interventions    Readmission Risk Interventions Readmission Risk Prevention Plan 08/14/2019 07/27/2019 03/27/2019  Transportation Screening Complete Complete Complete  Medication Review (RN Care Manager) Referral to Pharmacy - (No Data)  PCP or Specialist appointment within 3-5 days of discharge Complete Complete Complete  HRI or Home Care Consult Complete Complete Complete  SW Recovery Care/Counseling Consult Complete Complete Complete  Palliative Care Screening Not Applicable Not Applicable Not [place] Not Applicable Not Applicable Not Applicable  Some recent data might be hidden

## 2019-08-25 NOTE — Progress Notes (Signed)
Mobility Specialist - Progress Note   08/25/19 1508  Mobility  Activity Refused mobility  Mobility performed by Mobility specialist    Pt refused mobility d/t feeling fatigue. Will attempt session again at a later date/time.    Katelyn Lane Mobility Specialist  08/25/19, 3:09 PM

## 2019-08-25 NOTE — TOC Progression Note (Signed)
Transition of Care [facility]) - Progression Note    Patient Details  Name: Katelyn Lane MRN: [id number] Date of Birth: 08-Oct-1955  Transition of Care [facility]) CM/SW Contact  Meriel Flavors, [employer] Phone Number: 08/25/2019, 8:48 AM  Clinical Narrative:    Unable to find placement that will accept patient.         Expected Discharge Plan and Services                                                 Social Determinants of Health (SDOH) Interventions    Readmission Risk Interventions Readmission Risk Prevention Plan 08/14/2019 07/27/2019 03/27/2019  Transportation Screening Complete Complete Complete  Medication Review Press photographer) Referral to Pharmacy - (No Data)  PCP or Specialist appointment within 3-5 days of discharge Complete Complete Complete  HRI or Home Care Consult Complete Complete Complete  SW Recovery Care/Counseling Consult Complete Complete Complete  Palliative Care Screening Not Applicable Not Applicable Not [place] Not Applicable Not Applicable Not Applicable  Some recent data might be hidden

## 2019-08-25 NOTE — Progress Notes (Signed)
PROGRESS NOTE    Katelyn Lane  JTT:[id number] DOB: 1955/03/20 DOA: 07/26/2019 PCP: [name], MD   Chief complaint.  Leg edema and shortness of breath.  Brief Narrative:  64 year old female with known history of chronic systolic heart failure, recent COVID-19 infection, hyperlipidemia and hypertension, stage IIIb chronic kidney disease presented to the emergency room 07/27/2019 with acute onset of worsening dyspnea associated with dry cough wheezing, orthopnea and PND as well lower extremity edema for 1 week. Patient had previous admissions with noncompliance. She smokes cocaine and she is unfortunately homeless. Admitted for acute on chronic systolic and diastolic CHF and AKI on sage IIIb CKD. The patient had a 2D echo on 06/22/2019 that revealed an EF of 25 to 30% with grade 3 diastolic dysfunction, severe left atrial and right atrial dilatation as well as mild mitral regurgitation. Chest x-ray showed cardiomegaly and interstitial pulmonary edema. Patient is 26days in the hospital today due to disposition problems.  8/14. Patient still has significant anasarca with the severe leg edema. Increase torsemide to 40 mg daily. Monitor renal function. Also obtain right upper quadrant ultrasound to rule out liver cirrhosis.  8/15.Discussed with social worker, patient has been refused all to help from the hospital. We will evaluate her decision making capacity. 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   8/16.  Leg edema is better, renal function improving after giving 40 mg of torsemide yesterday, continue torsemide at 20 mg twice a day.  8/17.  Patient is deemed to be not competent to make her own decisions by psychiatry.  APS is involved.  Social worker is exploring option for discharge.   Assessment & Plan:   Active Problems:   Hyperlipidemia   Depression   CKD (chronic kidney disease), stage III   Acute metabolic encephalopathy   History of  substance abuse (HCC)   Chronic obstructive pulmonary disease (HCC)   Pulmonary hypertension (HCC)   Acute on chronic combined systolic (congestive) and diastolic (congestive) heart failure (HCC)   History of anemia due to chronic kidney disease   Prolonged QT interval   Acute CHF (congestive heart failure) ([place])  #1.  Acute on chronic combined systolic and diastolic congestive heart failure with moderate pulmonary hypertension. Patient still has some volume overload, currently on torsemide 40 mg/day.  Monitor renal function.  2.  Acute kidney injury on chronic kidney disease stage IIIb. Renal function has been stable.  3.  Anemia of chronic disease with iron deficient anemia. Received multiple doses of IV iron, hemoglobin has been stable.  4.  Metabolic encephalopathy. Condition had improved.  5.  History of ventricular thrombus. Continue Xarelto.  6.  Lacking of decision making capacity. APS has involved.   DVT prophylaxis:Xarelto Code Status:Full Family Communication:None Disposition Plan:  Patient came from:  Anticipated d/c place:?  Barriers to d/c OR conditions which need to be met to effect a safe d/c: Patient is deemed not to be able to make her own decision, will need to refer to adult protection agency and appoint a guardian.  Consultants:  Nephrology and cardiology  Procedures: Antimicrobials: None   Subjective: Still complaining of bilateral leg edema, short of breath much improved. No nausea vomiting.  No abdominal pain. No fever chills.  Objective: Vitals:   08/24/19 1207 08/24/19 2003 08/25/19 0512 08/25/19 1121  BP: (!) 112/91 129/90 109/83 116/89  Pulse: 68 68 73 70  Resp: '18 20 18 16  '$ Temp: 98.2 F (36.8 C) 98 F (36.7 C) 98.6  F (37 C) 97.7 F (36.5 C)  TempSrc: Oral Oral Oral Oral  SpO2: 97% 100% 99% 100%    Weight:      Height:        Intake/Output Summary (Last 24 hours) at 08/25/2019 1246 Last data filed at 08/25/2019 0900 Gross per 24 hour  Intake 240 ml  Output 0 ml  Net 240 ml   Filed Weights   08/20/19 1500 08/22/19 0456 08/24/19 0431  Weight: 96.3 kg 94.5 kg 95.7 kg    Examination:  General exam: Appears calm and comfortable  Respiratory system: Clear to auscultation. Respiratory effort normal. Cardiovascular system: S1 & S2 heard, RRR. No JVD, murmurs, rubs, gallops or clicks. 2+ leg edema. Gastrointestinal system: Abdomen is nondistended, soft and nontender. No organomegaly or masses felt. Normal bowel sounds heard. Central nervous system: Alert and oriented. No focal neurological deficits. Extremities: Symmetric . Skin: No rashes, lesions or ulcers Psychiatry:  Mood & affect appropriate.     Data Reviewed: I have personally reviewed following labs and imaging studies  CBC: Recent Labs  Lab 08/20/19 0525 08/23/19 0941  WBC 6.3 6.1  HGB 10.3* 9.8*  HCT 35.0* 32.6*  MCV 77.3* 76.5*  PLT 582* 846*   Basic Metabolic Panel: Recent Labs  Lab 08/20/19 0525 08/23/19 0941 08/24/19 0419 08/25/19 0551  NA  --  139 139 139  K  --  4.4 3.7 3.4*  CL  --  99 101 100  CO2  --  '27 27 28  '$ GLUCOSE  --  86 88 121*  BUN  --  43* 40* 41*  CREATININE 1.89* 1.96* 1.93* 1.93*  CALCIUM  --  9.3 9.1 9.0  MG  --  2.6*  --   --    GFR: Estimated Creatinine Clearance: 35.4 mL/min (A) (by C-G formula based on SCr of 1.93 mg/dL (H)). Liver Function Tests: No results for input(s): AST, ALT, ALKPHOS, BILITOT, PROT, ALBUMIN in the last 168 hours. No results for input(s): LIPASE, AMYLASE in the last 168 hours. No results for input(s): AMMONIA in the last 168 hours. Coagulation Profile: No results for input(s): INR, PROTIME in the last 168 hours. Cardiac Enzymes: No results for input(s): CKTOTAL, CKMB, CKMBINDEX, TROPONINI in the last 168 hours. BNP (last 3 results) No results  for input(s): PROBNP in the last 8760 hours. HbA1C: No results for input(s): HGBA1C in the last 72 hours. CBG: No results for input(s): GLUCAP in the last 168 hours. Lipid Profile: No results for input(s): CHOL, HDL, LDLCALC, TRIG, CHOLHDL, LDLDIRECT in the last 72 hours. Thyroid Function Tests: No results for input(s): TSH, T4TOTAL, FREET4, T3FREE, THYROIDAB in the last 72 hours. Anemia Panel: No results for input(s): VITAMINB12, FOLATE, FERRITIN, TIBC, IRON, RETICCTPCT in the last 72 hours. Sepsis Labs: No results for input(s): PROCALCITON, LATICACIDVEN in the last 168 hours.  Recent Results (from the past 240 hour(s))  CULTURE, BLOOD (ROUTINE X 2) w Reflex to ID Panel     Status: None   Collection Time: 08/15/19 11:14 PM   Specimen: BLOOD  Result Value Ref Range Status   Specimen Description BLOOD BLOOD RIGHT WRIST  Final   Special Requests   Final    BOTTLES DRAWN AEROBIC AND ANAEROBIC Blood Culture results may not be optimal due to an excessive volume of blood received in culture bottles   Culture   Final    NO GROWTH 5 DAYS Performed at [facility], [address], [place], [place] [address]  Report Status 08/20/2019 FINAL  Final  CULTURE, BLOOD (ROUTINE X 2) w Reflex to ID Panel     Status: None   Collection Time: 08/15/19 11:21 PM   Specimen: BLOOD  Result Value Ref Range Status   Specimen Description BLOOD BLOOD RIGHT FOREARM  Final   Special Requests   Final    BOTTLES DRAWN AEROBIC AND ANAEROBIC Blood Culture adequate volume   Culture   Final    NO GROWTH 5 DAYS Performed at [facility], [address], [place], [place] [address]    Report Status 08/20/2019 FINAL  Final         Radiology Studies: No results found.      Scheduled Meds: . aspirin EC  81 mg Oral Daily  . atorvastatin  20 mg Oral Daily  . benztropine  0.25 mg Oral BID  . calcium carbonate  1 tablet Oral TID WC  . carvedilol  3.125 mg Oral BID WC  .  dextromethorphan-guaiFENesin  1 tablet Oral BID  . feeding supplement (ENSURE ENLIVE)  237 mL Oral BID BM  . ferrous sulfate  325 mg Oral Q breakfast  . FLUoxetine  30 mg Oral Daily  . gabapentin  300 mg Oral TID  . magnesium oxide  400 mg Oral Daily  . midodrine  2.5 mg Oral TID WC  . multivitamin with minerals  1 tablet Oral Daily  . potassium chloride  40 mEq Oral BID  . rivaroxaban  20 mg Oral Q supper  . sodium chloride flush  3 mL Intravenous Once  . sodium chloride flush  3 mL Intravenous Q12H  . thiothixene  1 mg Oral BID  . torsemide  20 mg Oral BID   Continuous Infusions:   LOS: 29 days    Time spent: 25 minutes    Sharen Hones, MD [facility]   To contact the attending provider between 7A-7P or the covering provider during after hours 7P-7A, please log into the web site [email or web address] and access using universal  password for that web site. If you do not have the password, please call the hospital operator.  08/25/2019, 12:46 PM

## 2019-08-25 NOTE — Progress Notes (Signed)
Physical Therapy Treatment Patient Details Name: Katelyn Lane MRN: [id number] DOB: [birth year] Today's Date: 08/25/2019    History of Present Illness 64 year old female with known history of chronic systolic heart failure, recent COVID-19 infection, hyperlipidemia and hypertension, stage IIIb chronic kidney disease presented to the emergency room 07/27/2019 with acute onset of worsening dyspnea associated with dry cough wheezing, orthopnea and PND as well lower extremity edema for 1 week.  Patient had previous admissions with noncompliance.  She smokes cocaine and she is unfortunately homeless. Admitted for acute on chronic systolic and diastolic CHF and AKI on sage IIIb CKD. The patient had a 2D echo on 06/22/2019 that revealed an EF of 25 to 30% with grade 3 diastolic dysfunction, severe left atrial and right atrial dilatation as well as mild mitral regurgitation. Chest x-ray showed cardiomegaly and interstitial pulmonary edema.    PT Comments    Pt was initially unwilling to participate with PT, but pleasant and motivated after briefly tidying up her room. Pt was able to perform sit-to-stand this session w/ supervision; demonstrated good eccentric control and required no physical assist. Pt was able to ambulate a few steps w/ CGA and no AD to RW, appeared steady on feet throughout and demonstrated good ability to maneuver RW through tight spaces. Pt then ambulated ~158ft w/ RW w/ a trunk flexed, step-through, gait pattern. Pt had variable gait speed with inconsistent management of RW. Pt requested to sit down once due to BLE pain, but then impulsively turned around and walked back to room instead. Pt returned to sit at EOB and appeared significantly SOB; SpO2 measured as 99% and HR 84bpm at this time. Pt would continue to benefit from 24hr supervision/assistance due to impulsivity noted throughout session and potential fall risk.   Follow Up Recommendations  Supervision/Assistance - 24 hour      Equipment Recommendations  Rolling walker with 5" wheels    Recommendations for Other Services       Precautions / Restrictions Precautions Precautions: Fall Restrictions Weight Bearing Restrictions: No    Mobility  Bed Mobility               General bed mobility comments: was seated EOB pre/post session.  Transfers Overall transfer level: Needs assistance   Transfers: Sit to/from Stand Sit to Stand: Supervision         General transfer comment: able to sit-to-stand w/ no physical assist  Ambulation/Gait Ambulation/Gait assistance: Min guard Gait Distance (Feet): 125 Feet Assistive device: Rolling walker (2 wheeled) Gait Pattern/deviations: Step-through pattern;Trunk flexed Gait velocity: decreased   General Gait Details: no LOB or unsteadiness however pt does state BLE pain. Pt requested to sit once then turned around instead and walked back to room. Appeared SOB following ambulation but SpO2 99% and SOB resolved quickly   Stairs             Wheelchair Mobility    Modified Rankin (Stroke Patients Only)       Balance Overall balance assessment: Needs assistance Sitting-balance support: No upper extremity supported;Feet supported Sitting balance-Leahy Scale: Good     Standing balance support: During functional activity;Bilateral upper extremity supported Standing balance-Leahy Scale: Good Standing balance comment: able to shift weight through BLE, took a few steps w/o RW, maneuvered RW through tight spaces. No LOB noted throughout session                            Cognition Arousal/Alertness: Awake/alert Behavior During  Therapy: WFL for tasks assessed/performed Overall Cognitive Status: History of cognitive impairments - at baseline                                 General Comments: Impulsive at times; able to follow commands throughout session      Exercises      General Comments        Pertinent  Vitals/Pain Pain Assessment: Faces Faces Pain Scale: Hurts little more Pain Location: B LEs Pain Descriptors / Indicators: Grimacing;Aching;Sore Pain Intervention(s): Limited activity within patient's tolerance;Monitored during session;Repositioned    Home Living                      Prior Function            PT Goals (current goals can now be found in the care plan section) Progress towards PT goals: Progressing toward goals    Frequency    Min 2X/week      PT Plan Current plan remains appropriate    Co-evaluation              AM-PAC PT "6 Clicks" Mobility   Outcome Measure  Help needed turning from your back to your side while in a flat bed without using bedrails?: A Little Help needed moving from lying on your back to sitting on the side of a flat bed without using bedrails?: A Little Help needed moving to and from a bed to a chair (including a wheelchair)?: A Little Help needed standing up from a chair using your arms (e.g., wheelchair or bedside chair)?: None Help needed to walk in hospital room?: A Little Help needed climbing 3-5 steps with a railing? : A Little 6 Click Score: 19    End of Session Equipment Utilized During Treatment: Gait belt Activity Tolerance: Patient tolerated treatment well Patient left: with call bell/phone within reach;in bed Nurse Communication: Mobility status PT Visit Diagnosis: Unsteadiness on feet (R26.81);Muscle weakness (generalized) (M62.81);Difficulty in walking, not elsewhere classified (R26.2)     Time: 8110-3159 PT Time Calculation (min) (ACUTE ONLY): 14 min  Charges:                        Darlys Buis SPT 08/25/19, 3:38 PM

## 2019-08-25 NOTE — Progress Notes (Addendum)
   08/24/19 0730  Clinical Encounter Type  Visited With Patient  Visit Type Follow-up  Referral From Chaplain  Consult/Referral To Chaplain  Chaplain revisited with patient. They talked for a long time. Chaplain was able to get some information about patient's family and how she feels about them. Patient noted that her family does not stick together. Patient is wondering why she can not stay in her mother's house that is being take care of by her sister, [name]? When questioned about leaving [facility], patient said she is able to make it on her own, she needs a large one bed room apt. When questioned about being able to stay clean, patient said she can stay clean "I can do all things through [facility]." Patient she got on the wrong track, but believe Dr. Reather Laurence can help her. Patient also told chaplain "I want to stay clean, I pray to stay clean, and I need to get away from things." Patient is determined that she can stay clean once leaving [facility].

## 2019-08-26 DIAGNOSIS — N1832 Chronic kidney disease, stage 3b: Secondary | ICD-10-CM | POA: Diagnosis not present

## 2019-08-26 DIAGNOSIS — F320 Major depressive disorder, single episode, mild: Secondary | ICD-10-CM | POA: Diagnosis not present

## 2019-08-26 DIAGNOSIS — I5043 Acute on chronic combined systolic (congestive) and diastolic (congestive) heart failure: Secondary | ICD-10-CM | POA: Diagnosis not present

## 2019-08-26 DIAGNOSIS — I5041 Acute combined systolic (congestive) and diastolic (congestive) heart failure: Secondary | ICD-10-CM | POA: Diagnosis not present

## 2019-08-26 LAB — CBC
HCT: 32.3 % — ABNORMAL LOW (ref 36.0–46.0)
Hemoglobin: 9.4 g/dL — ABNORMAL LOW (ref 12.0–15.0)
MCH: 23.5 pg — ABNORMAL LOW (ref 26.0–34.0)
MCHC: 29.1 g/dL — ABNORMAL LOW (ref 30.0–36.0)
MCV: 80.8 fL (ref 80.0–100.0)
Platelets: 376 10*3/uL (ref 150–400)
RBC: 4 MIL/uL (ref 3.87–5.11)
RDW: 27.3 % — ABNORMAL HIGH (ref 11.5–15.5)
WBC: 5.1 10*3/uL (ref 4.0–10.5)
nRBC: 0 % (ref 0.0–0.2)

## 2019-08-26 LAB — BASIC METABOLIC PANEL
Anion gap: 14 (ref 5–15)
BUN: 39 mg/dL — ABNORMAL HIGH (ref 8–23)
CO2: 26 mmol/L (ref 22–32)
Calcium: 9.3 mg/dL (ref 8.9–10.3)
Chloride: 100 mmol/L (ref 98–111)
Creatinine, Ser: 1.93 mg/dL — ABNORMAL HIGH (ref 0.44–1.00)
GFR calc Af Amer: 31 mL/min — ABNORMAL LOW (ref >60)
GFR calc non Af Amer: 27 mL/min — ABNORMAL LOW (ref >60)
Glucose, Bld: 89 mg/dL (ref 70–99)
Potassium: 3.8 mmol/L (ref 3.5–5.1)
Sodium: 140 mmol/L (ref 135–145)

## 2019-08-26 LAB — MAGNESIUM: Magnesium: 2.2 mg/dL (ref 1.7–2.4)

## 2019-08-26 MED ORDER — FUROSEMIDE 10 MG/ML IJ SOLN
40.0000 mg | Freq: Once | INTRAMUSCULAR | Status: DC
  Filled 2019-08-26: qty 4

## 2019-08-26 MED ORDER — FUROSEMIDE 40 MG PO TABS
40.0000 mg | ORAL_TABLET | Freq: Once | ORAL | Status: AC
  Administered 2019-08-26: 40 mg via ORAL

## 2019-08-26 NOTE — Progress Notes (Signed)
Mobility Specialist - Progress Note   08/26/19 1527  Mobility  Activity Ambulated in hall  Level of Assistance Modified independent, requires aide device or extra time  Assistive Device Front wheel walker  Distance Ambulated (ft) 180 ft  Mobility Response Tolerated well  Mobility performed by Mobility specialist  $Mobility charge 1 Mobility    Pre-mobility: 74 HR, 125/92 BP, 100% SpO2 Post-mobility: 79 HR, 142/98 BP, 100% SpO2   Pt was sitting EOB upon arrival. Pt agreed to session. Pt was ModI in both sit-to-stand and while ambulating.  Pt ambulated a total of 180' on 2L of O2. No heavy breathing or SOB noted. Pt tolerated session well. Pt was left EOB with phone/call bell in reach.    Kathee Delton Mobility Specialist 08/26/19, 3:33 PM

## 2019-08-26 NOTE — Progress Notes (Signed)
Occupational Therapy Treatment Patient Details Name: Katelyn Lane MRN: [id number] DOB: 05/05/1955 Today's Date: 08/26/2019    History of present illness 64 year old female with known history of chronic systolic heart failure, recent COVID-19 infection, hyperlipidemia and hypertension, stage IIIb chronic kidney disease presented to the emergency room 07/27/2019 with acute onset of worsening dyspnea associated with dry cough wheezing, orthopnea and PND as well lower extremity edema for 1 week.  Patient had previous admissions with noncompliance.  She smokes cocaine and she is unfortunately homeless. Admitted for acute on chronic systolic and diastolic CHF and AKI on sage IIIb CKD. The patient had a 2D echo on 06/22/2019 that revealed an EF of 25 to 30% with grade 3 diastolic dysfunction, severe left atrial and right atrial dilatation as well as mild mitral regurgitation. Chest x-ray showed cardiomegaly and interstitial pulmonary edema.   OT comments  Upon entering the room, pt seated on EOB and very friendly requesting therapist to work with her. Pt asking therapist to take her outside. Therapist said she would let nursing staff know of her request but we would be unable to during this session. NT arrived and verbalized RN stating she could not go outside. Pt's behavior escalating. She donned B shoes with supervision and standing while throwing RW around in room. Pt then ambulating in hallway with close supervision while being very belligerent and yelling obscenities in hallway. OT able to get pt to sit and rest on bench and RN staff arrived with transport chair to assist pt outside. She ambulated with overall S - min guard for safety but therapist unable to redirect secondary to behaviors this session.     Follow Up Recommendations  SNF;Supervision - Intermittent    Equipment Recommendations  3 in 1 bedside commode       Precautions / Restrictions Precautions Precautions: Fall Precaution Comments:  High fall       Mobility Bed Mobility Overal bed mobility: Needs Assistance       Supine to sit: Supervision        Transfers   Equipment used: Rolling walker (2 wheeled) Transfers: Sit to/from Stand Sit to Stand: Supervision         Balance Overall balance assessment: Needs assistance Sitting-balance support: No upper extremity supported;Feet supported Sitting balance-Leahy Scale: Good Sitting balance - Comments: pt does not have LOB in sitting even while donning B shoes   Standing balance support: During functional activity;Bilateral upper extremity supported Standing balance-Leahy Scale: Good         ADL either performed or assessed with clinical judgement   ADL Overall ADL's : Needs assistance/impaired         General ADL Comments: Pt donning B shoes while seated on EOB with increased time. Pt with perceived exertion and SOB but vitals remained 98% on RA     Vision Baseline Vision/History: No visual deficits Patient Visual Report: No change from baseline Vision Assessment?: No apparent visual deficits          Cognition Arousal/Alertness: Awake/alert Behavior During Therapy: WFL for tasks assessed/performed Overall Cognitive Status: History of cognitive impairments - at baseline     General Comments: very impulsive and demanding during session unable to redirect pt                   Pertinent Vitals/ Pain       Pain Assessment: No/denies pain         Frequency  Min 1X/week        Progress  Toward Goals  OT Goals(current goals can now be found in the care plan section)  Progress towards OT goals: Progressing toward goals  Acute Rehab OT Goals Patient Stated Goal: " I want to go outside" OT Goal Formulation: With patient Time For Goal Achievement: 09/02/19  Plan Discharge plan remains appropriate       AM-PAC OT "6 Clicks" Daily Activity     Outcome Measure   Help from another person eating meals?: None Help from another  person taking care of personal grooming?: None Help from another person toileting, which includes using toliet, bedpan, or urinal?: A Little Help from another person bathing (including washing, rinsing, drying)?: A Little Help from another person to put on and taking off regular upper body clothing?: None Help from another person to put on and taking off regular lower body clothing?: A Little 6 Click Score: 21    End of Session Equipment Utilized During Treatment: Other (comment);Rolling walker  OT Visit Diagnosis: Other abnormalities of gait and mobility (R26.89);Muscle weakness (generalized) (M62.81)   Activity Tolerance Patient tolerated treatment well   Patient Left Other (comment) (off unit with staff)   Nurse Communication Mobility status;Other (comment) (pt beligerent in hallway requesting to go outside)        Time: 1340-1407 OT Time Calculation (min): 27 min  Charges: OT General Charges $OT Visit: 1 Visit OT Treatments $Self Care/Home Management : 8-22 mins $Therapeutic Activity: 8-22 mins  Darleen Crocker, MS, OTR/L , CBIS ascom [phone]  08/26/19, 3:16 PM

## 2019-08-26 NOTE — TOC Progression Note (Signed)
Transition of Care [facility]) - Progression Note    Patient Details  Name: Katelyn Lane MRN: [id number] Date of Birth: 1955-12-03  Transition of Care [facility]) CM/SW Contact  Beverly Sessions, RN Phone Number: 08/26/2019, 9:34 AM  Clinical Narrative:     PT still currently recommends  Supervision/Assistance - 24 hour;Supervision for mobility/OOB OT recommends SNF level of care  Per psych patient does not have capacity.   Patient is currently homeless, and there is not a safe discharge disposition  RNCM spoke with Allayne Butcher Case Manger at [facility].  She confirms that patient does have SNF benefits, and will email me a list of in network facilities.   TOC spoke with patient's sister [name].  She lives in [place].  She states that previously patient lived at home with her.  [name] states that she would be willing to make decisions for patient, however patient can not return to live with her.  She states "she has a funky attitude, and isn't welcome here".   [name] states there is another sister who lives in [place], and a nephew De-Wayne who likes in [place] [phone].  She confirms that patient can not stay with nephew.  He lives in a 3 bedroom house with himself, 3 daughters, and a grandchild  TOC to contact APS to notify them patient does not have capacity, and sister wants to purse guardianship.  TOC to review list of in network facilities and reach out to them directly to see if they can accept patient       Expected Discharge Plan and Services                                                 Social Determinants of Health (SDOH) Interventions    Readmission Risk Interventions Readmission Risk Prevention Plan 08/14/2019 07/27/2019 03/27/2019  Transportation Screening Complete Complete Complete  Medication Review Press photographer) Referral to Pharmacy - (No Data)  PCP or Specialist appointment within 3-5 days of discharge Complete  Complete Complete  HRI or Home Care Consult Complete Complete Complete  SW Recovery Care/Counseling Consult Complete Complete Complete  Palliative Care Screening Not Applicable Not Applicable Not [place] Not Applicable Not Applicable Not Applicable  Some recent data might be hidden

## 2019-08-26 NOTE — Progress Notes (Signed)
Nutrition Follow Up Note   DOCUMENTATION CODES:   Obesity unspecified  INTERVENTION:   Ensure Enlive po BID, each supplement provides 350 kcal and 20 grams of protein  Magic cup TID with meals, each supplement provides 290 kcal and 9 grams of protein  Continue MVI daily   NUTRITION DIAGNOSIS:   Inadequate oral intake related to acute illness as evidenced by per patient/family report.  GOAL:   Patient will meet greater than or equal to 90% of their needs  -met  MONITOR:   PO intake, Supplement acceptance, Labs, Weight trends, Skin, I & O's  ASSESSMENT:   64 year old female with known history of chronic systolic heart failure, polysubstance abuse, homelessness, recent COVID-19 infection, hyperlipidemia and hypertension, stage IIIb chronic kidney disease who is admitted with CHF   Pt continues to have good appetite and oral intake; pt eating 100% of meals and drinking some Ensure supplements. Pt is homeless and does not have a safe discharge plan currently.   Medications reviewed and include: aspirin, ferrous sulfate, Mg oxide, MVI, torsemide  Labs reviewed: BUN 39(H), creat 1.93(H) Hgb 9.4(L), Hct 32.3(L)  Diet Order:   Diet Order            Diet 2 gram sodium Room service appropriate? Yes; Fluid consistency: Thin; Fluid restriction: 1200 mL Fluid  Diet effective now                EDUCATION NEEDS:   Education needs have been addressed  Skin:  Skin Assessment: Reviewed RN Assessment (ecchymosis)  Last BM:  8/17- type 4  Height:   Ht Readings from Last 1 Encounters:  07/30/19 $RemoveB'5\' 7"'QVPzwjeF$  (1.702 m)    Weight:   Wt Readings from Last 1 Encounters:  08/24/19 95.7 kg    Ideal Body Weight:  61.36 kg  BMI:  Body mass index is 33.04 kg/m.  Estimated Nutritional Needs:   Kcal:  1900-2200kcal/day  Protein:  95-110g/day  Fluid:  1.8L/day or per MD  Koleen Distance MS, RD, LDN Please refer to [facility] for RD and/or RD on-call/weekend/after hours pager

## 2019-08-26 NOTE — Progress Notes (Addendum)
PROGRESS NOTE    Katelyn Lane  XTK:[id number] DOB: 03/26/1955 DOA: 07/26/2019 PCP: [name], MD   Chief complaint.  Leg edema and shortness of breath.  Brief Narrative:  64 year old female with known history of chronic systolic heart failure, recent COVID-19 infection, hyperlipidemia and hypertension, stage IIIb chronic kidney disease presented to the emergency room 07/27/2019 with acute onset of worsening dyspnea associated with dry cough wheezing, orthopnea and PND as well lower extremity edema for 1 week. Patient had previous admissions with noncompliance. She smokes cocaine and she is unfortunately homeless. Admitted for acute on chronic systolic and diastolic CHF and AKI on sage IIIb CKD. The patient had a 2D echo on 06/22/2019 that revealed an EF of 25 to 30% with grade 3 diastolic dysfunction, severe left atrial and right atrial dilatation as well as mild mitral regurgitation. Chest x-ray showed cardiomegaly and interstitial pulmonary edema. Patient is 26days in the hospital today due to disposition problems.  8/14. Patient still has significant anasarca with the severe leg edema. Increase torsemide to 40 mg daily. Monitor renal function. Also obtain right upper quadrant ultrasound to rule out liver cirrhosis.  8/15.Discussed with social worker, patient has been refused all to help from the hospital. We will evaluate her decision making capacity. 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   8/16.  Leg edema is better, renal function improving after giving 40 mg of torsemide yesterday, continue torsemide at 20 mg twice a day.  8/17.  Patient is deemed to be not competent to make her own decisions by psychiatry.  APS is involved.  Social worker is exploring option for discharge.    8/18/patient apparently deemed incompetent unable to make decision by her son by psych.  APS still involved social worker still working on discharge -continue to  diurese on IV Lasix, tapering down due to increasing creatinine  Subjective: The patient was seen and examined this morning, still complaining of bilateral lower extremity pain, tightness Denies any shortness of breath or chest pain..     Assessment & Plan:   Active Problems:   Hyperlipidemia   Depression   CKD (chronic kidney disease), stage III   Acute metabolic encephalopathy   History of substance abuse (HCC)   Chronic obstructive pulmonary disease (HCC)   Pulmonary hypertension (HCC)   Acute on chronic combined systolic (congestive) and diastolic (congestive) heart failure (HCC)   History of anemia due to chronic kidney disease   Prolonged QT interval   Acute CHF (congestive heart failure) ([place])  #1.  Acute on chronic combined systolic and diastolic congestive heart failure with moderate pulmonary hypertension. Patient still has some volume overload,  Diuretics to be continued at torsemide 20 mg p.o. twice daily    2.  Acute kidney injury on chronic kidney disease stage IIIb. Renal function has been -elevated creatinine 1.93- Seems to be at baseline  3.  Anemia of chronic disease with iron deficient anemia. Received multiple doses of IV iron, hemoglobin has been stable.  4.  Metabolic encephalopathy. Condition had improved.  5.  History of ventricular thrombus. Continue Xarelto.  6.  Lacking of decision making capacity. APS has involved.   DVT prophylaxis:Xarelto Code Status:Full Family Communication:None Disposition Plan:  Patient came from:  Anticipated d/c place: SNF   Barriers to d/c OR conditions which need to be met to effect a safe d/c: Patient is deemed not to be able to make her own decision, will need to refer to adult protection agency and  appoint a guardian.  Per psych patient does not have capacity.  Patient is  currently homeless, and there is not a safe discharge disposition    Consultants:  Nephrology and cardiology  Procedures: Antimicrobials: None  Objective: Vitals:   08/25/19 2029 08/26/19 0425 08/26/19 1000 08/26/19 1018  BP: (!) 117/91 115/87 (!) 94/57 110/81  Pulse: 73 77 71 74  Resp: 20 (!) 22 20   Temp: 97.8 F (36.6 C) (!) 97.4 F (36.3 C) 97.9 F (36.6 C)   TempSrc: Oral Oral Oral   SpO2: 100% 98% 98%   Weight:      Height:        Intake/Output Summary (Last 24 hours) at 08/26/2019 1028 Last data filed at 08/26/2019 1023 Gross per 24 hour  Intake 720 ml  Output --  Net 720 ml   Filed Weights   08/20/19 1500 08/22/19 0456 08/24/19 0431  Weight: 96.3 kg 94.5 kg 95.7 kg    Examination:     Physical Exam:   General:  Alert, oriented, cooperative, no distress;   HEENT:  Normocephalic, PERRL, otherwise with in Normal limits   Neuro:  CNII-XII intact. , normal motor and sensation, reflexes intact   Lungs:   Clear to auscultation BL, Respirations unlabored, no wheezes / crackles  Cardio:    S1/S2, RRR, No murmure, No Rubs or Gallops   Abdomen:   Soft, non-tender, bowel sounds active all four quadrants,  no guarding or peritoneal signs.  Muscular skeletal:  Limited exam - in bed, able to move all 4 extremities, Normal strength,  2+ pulses,  symmetric, No pitting edema  Skin:  Dry, warm to touch, negative for any Rashes, No open wounds  Wounds: Please see nursing documentation             Data Reviewed: I have personally reviewed following labs and imaging studies  CBC: Recent Labs  Lab 08/20/19 0525 08/23/19 0941 08/26/19 0554  WBC 6.3 6.1 5.1  HGB 10.3* 9.8* 9.4*  HCT 35.0* 32.6* 32.3*  MCV 77.3* 76.5* 80.8  PLT 582* 468* 376   Basic Metabolic Panel: Recent Labs  Lab 08/20/19 0525 08/23/19 0941 08/24/19 0419 08/25/19 0551 08/26/19 0554  NA  --  139 139 139 140  K  --  4.4 3.7 3.4* 3.8  CL  --  99 101 100 100  CO2  --  27 27 28  26  GLUCOSE  --  86 88 121* 89  BUN  --  43* 40* 41* 39*  CREATININE 1.89* 1.96* 1.93* 1.93* 1.93*  CALCIUM  --  9.3 9.1 9.0 9.3  MG  --  2.6*  --   --  2.2       Radiology Studies: No results found.      Scheduled Meds: . aspirin EC  81 mg Oral Daily  . atorvastatin  20 mg Oral Daily  . benztropine  0.25 mg Oral BID  . calcium carbonate  1 tablet Oral TID WC  . carvedilol  3.125 mg Oral BID WC  . dextromethorphan-guaiFENesin  1 tablet Oral BID  . feeding supplement (ENSURE ENLIVE)  237 mL Oral BID BM  . ferrous sulfate  325 mg Oral Q breakfast  . FLUoxetine  30 mg Oral Daily  . gabapentin  300 mg Oral TID  . magnesium oxide  400 mg Oral Daily  . midodrine  2.5 mg Oral TID WC  . multivitamin with minerals  1 tablet Oral Daily  . rivaroxaban    20 mg Oral Q supper  . sodium chloride flush  3 mL Intravenous Once  . sodium chloride flush  3 mL Intravenous Q12H  . thiothixene  1 mg Oral BID  . torsemide  20 mg Oral BID   Continuous Infusions:   LOS: 30 days    Time spent: 25 minutes     A , MD [facility]   To contact the attending provider between 7A-7P or the covering provider during after hours 7P-7A, please log into the web site [email or web address] and access using universal [place] password for that web site. If you do not have the password, please call the hospital operator.  08/26/2019, 10:28 AM    

## 2019-08-27 ENCOUNTER — Inpatient Hospital Stay: Payer: Medicaid Other

## 2019-08-27 LAB — BRAIN NATRIURETIC PEPTIDE: B Natriuretic Peptide: 3266.2 pg/mL — ABNORMAL HIGH (ref 0.0–100.0)

## 2019-08-27 MED ORDER — FUROSEMIDE 40 MG PO TABS
40.0000 mg | ORAL_TABLET | Freq: Once | ORAL | Status: AC
  Administered 2019-08-27: 40 mg via ORAL
  Filled 2019-08-27: qty 1

## 2019-08-27 MED ORDER — FUROSEMIDE 10 MG/ML IJ SOLN: 20.0000 mg | Freq: Once | INTRAMUSCULAR | Status: DC

## 2019-08-27 MED ORDER — ALLOPURINOL 100 MG PO TABS
100.0000 mg | ORAL_TABLET | Freq: Every day | ORAL | Status: DC
  Administered 2019-08-27 – 2019-09-02 (×7): 100 mg via ORAL
  Filled 2019-08-27 (×7): qty 1

## 2019-08-27 NOTE — Progress Notes (Signed)
Physical Therapy Treatment Patient Details Name: Miria Cappelli MRN: [id number] DOB: 1955-08-22 Today's Date: 08/27/2019    History of Present Illness 64 year old female with known history of chronic systolic heart failure, recent COVID-19 infection, hyperlipidemia and hypertension, stage IIIb chronic kidney disease presented to the emergency room 07/27/2019 with acute onset of worsening dyspnea associated with dry cough wheezing, orthopnea and PND as well lower extremity edema for 1 week.  Patient had previous admissions with noncompliance.  She smokes cocaine and she is unfortunately homeless. Admitted for acute on chronic systolic and diastolic CHF and AKI on sage IIIb CKD. The patient had a 2D echo on 06/22/2019 that revealed an EF of 25 to 30% with grade 3 diastolic dysfunction, severe left atrial and right atrial dilatation as well as mild mitral regurgitation. Chest x-ray showed cardiomegaly and interstitial pulmonary edema.    PT Comments    Patient agreeable to PT. Patient required supervision for sit to stand transfers from various surfaces with verbal cues for safe technique. Patient ambulated 170ft with one seated rest break using rolling walker for support. Cues for upright posture and positioning of rolling walker closer to base of support. Patient is short of breath with activity with Sp02 100% on 2 L02. Recommend to continue PT to maximize function and safety to facilitate independence.     Follow Up Recommendations  Supervision/Assistance - 24 hour;Home health PT     Equipment Recommendations  Rolling walker with 5" wheels    Recommendations for Other Services       Precautions / Restrictions Precautions Precautions: Fall Restrictions Weight Bearing Restrictions: No    Mobility  Bed Mobility               General bed mobility comments: not observed this session as patient sitting up pre and post session   Transfers Overall transfer level: Needs  assistance Equipment used: Rolling walker (2 wheeled) Transfers: Sit to/from Stand Sit to Stand: Supervision         General transfer comment: supervision provided for transfers from multiple surfaces, including bed and bench in hallway. occasional cues for hand placement for safety   Ambulation/Gait Ambulation/Gait assistance: Min guard Gait Distance (Feet): 160 Feet Assistive device: Rolling walker (2 wheeled) Gait Pattern/deviations: Step-through pattern;Trunk flexed Gait velocity: decreased   General Gait Details: patient needs verbal cues for upright standing posture and to keep rolling walker closer to base of support. one seated rest break required during ambulation. patient is fatigued with walking and has mild shortness of breath. Sp02 100% at rest and after walking on 2 L02.    Stairs             Wheelchair Mobility    Modified Rankin (Stroke Patients Only)       Balance Overall balance assessment: Needs assistance Sitting-balance support: No upper extremity supported;Feet supported Sitting balance-Leahy Scale: Good     Standing balance support: During functional activity;Bilateral upper extremity supported Standing balance-Leahy Scale: Good                              Cognition Arousal/Alertness: Awake/alert Behavior During Therapy: WFL for tasks assessed/performed Overall Cognitive Status: History of cognitive impairments - at baseline                                 General Comments: patient cooperative and agreeable during PT session  Exercises      General Comments        Pertinent Vitals/Pain Pain Location: vague pain reported in bilateral feet with ambulation initially     Home Living                      Prior Function            PT Goals (current goals can now be found in the care plan section) Acute Rehab PT Goals Patient Stated Goal: to be more independent  PT Goal Formulation: With  patient Time For Goal Achievement: 09/10/19 Potential to Achieve Goals: Good Progress towards PT goals: Progressing toward goals    Frequency    Min 2X/week      PT Plan Current plan remains appropriate    Co-evaluation              AM-PAC PT "6 Clicks" Mobility   Outcome Measure  Help needed turning from your back to your side while in a flat bed without using bedrails?: A Little Help needed moving from lying on your back to sitting on the side of a flat bed without using bedrails?: A Little Help needed moving to and from a bed to a chair (including a wheelchair)?: A Little Help needed standing up from a chair using your arms (e.g., wheelchair or bedside chair)?: A Little Help needed to walk in hospital room?: A Little Help needed climbing 3-5 steps with a railing? : A Little 6 Click Score: 18    End of Session Equipment Utilized During Treatment: Gait belt;Oxygen Activity Tolerance: Patient tolerated treatment well Patient left: Other (comment) (sitting up on edge of bed at end of session ) Nurse Communication: Mobility status PT Visit Diagnosis: Unsteadiness on feet (R26.81);Muscle weakness (generalized) (M62.81);Difficulty in walking, not elsewhere classified (R26.2)     Time: 6712-4580 PT Time Calculation (min) (ACUTE ONLY): 23 min  Charges:  $Gait Training: 23-37 mins                     Minna Merritts, PT, MPT    Percell Locus 08/27/2019, 12:55 PM

## 2019-08-27 NOTE — Progress Notes (Signed)
PROGRESS NOTE    Katelyn Lane  WCH:[id number] DOB: 1955-06-13 DOA: 07/26/2019 PCP: [name], MD   Chief complaint.  Leg edema and shortness of breath.  Brief Narrative:  64 year old female with known history of chronic systolic heart failure, recent COVID-19 infection, hyperlipidemia and hypertension, stage IIIb chronic kidney disease presented to the emergency room 07/27/2019 with acute onset of worsening dyspnea associated with dry cough wheezing, orthopnea and PND as well lower extremity edema for 1 week. Patient had previous admissions with noncompliance. She smokes cocaine and she is unfortunately homeless. Admitted for acute on chronic systolic and diastolic CHF and AKI on sage IIIb CKD. The patient had a 2D echo on 06/22/2019 that revealed an EF of 25 to 30% with grade 3 diastolic dysfunction, severe left atrial and right atrial dilatation as well as mild mitral regurgitation. Chest x-ray showed cardiomegaly and interstitial pulmonary edema. Patient is 26days in the hospital today due to disposition problems.  8/14. Patient still has significant anasarca with the severe leg edema. Increase torsemide to 40 mg daily. Monitor renal function. Also obtain right upper quadrant ultrasound to rule out liver cirrhosis.  8/15.Discussed with social worker, patient has been refused all to help from the hospital. We will evaluate her decision making capacity. 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   8/16.  Leg edema is better, renal function improving after giving 40 mg of torsemide yesterday, continue torsemide at 20 mg twice a day.  8/17.  Patient is deemed to be not competent to make her own decisions by psychiatry.  APS is involved.  Social worker is exploring option for discharge.    8/18/patient apparently deemed incompetent unable to make decision by her son by psych.  APS still involved social worker still working on discharge -continue to  diurese on IV Lasix, tapering down due to increasing creatinine   08/27/2019-patient was seen still complaining of shortness of breath -especially with exertion   Subjective: The patient was seen and examined this morning, stable Still complain of mild shortness of breath, worsening with exertion Shortness of breath gets worse mostly in the afternoons Denies any chest pain Remains on 2 L of oxygen, satting 100%    Assessment & Plan:   Active Problems:   Hyperlipidemia   Depression   CKD (chronic kidney disease), stage III   Acute metabolic encephalopathy   History of substance abuse (HCC)   Chronic obstructive pulmonary disease (HCC)   Pulmonary hypertension (HCC)   Acute on chronic combined systolic (congestive) and diastolic (congestive) heart failure (HCC)   History of anemia due to chronic kidney disease   Prolonged QT interval   Acute CHF (congestive heart failure) ([place])  #1.  Acute on chronic combined systolic and diastolic congestive heart failure with moderate pulmonary hypertension. Patient still has some volume overload,  Diuretics to be continued at torsemide 20 mg p.o. twice daily Additional dose of Lasix will be given IV Obtaining a chest x-ray, proBNP    2.  Acute kidney injury on chronic kidney disease stage IIIb. Renal function has been -elevated creatinine 1.93- Seems to be at baseline Monitoring closely  3.  Anemia of chronic disease with iron deficient anemia. Received multiple doses of IV iron, hemoglobin has been stable.  4.  Metabolic encephalopathy. Condition had improved. Mental status waxes and wanes -monitoring  5.  History of ventricular thrombus. Continue Xarelto.  6.  Lacking of decision making capacity. APS has involved.... To determine guardianship Psych was consulted  confirmed patient has lacked capacity of decision-making   DVT prophylaxis:Xarelto Code Status:Full Family Communication:None Disposition Plan:  Patient came  from:  Anticipated d/c place: SNF   Barriers to d/c OR conditions which need to be met to effect a safe d/c: Patient is deemed not to be able to make her own decision, will need to refer to adult protection agency and appoint a guardian.  Per psych patient does not have capacity.  Patient is currently homeless, and there is not a safe discharge disposition    Consultants:  Nephrology and cardiology  Procedures: Antimicrobials: None  Objective: Vitals:   08/27/19 0919 08/27/19 1000 08/27/19 1055 08/27/19 1235  BP: 111/71   119/90  Pulse: 89   63  Resp: 20   17  Temp: 97.7 F (36.5 C)   97.8 F (36.6 C)  TempSrc: Oral   Oral  SpO2: 100% 96% 100% 100%  Weight:      Height:        Intake/Output Summary (Last 24 hours) at 08/27/2019 1516 Last data filed at 08/27/2019 1300 Gross per 24 hour  Intake 518 ml  Output 400 ml  Net 118 ml   Filed Weights   08/22/19 0456 08/24/19 0431 08/27/19 0522  Weight: 94.5 kg 95.7 kg 97 kg    Examination:    Physical Exam:   General:  Alert, oriented, cooperative, no distress; speaks with short sentences,  HEENT:  Normocephalic, PERRL, otherwise with in Normal limits   Neuro:  CNII-XII intact. , normal motor and sensation, reflexes intact   Lungs:   Clear to auscultation BL, Respirations unlabored, no wheezes / crackles  Cardio:    S1/S2, RRR, No murmure, No Rubs or Gallops   Abdomen:   Soft, non-tender, bowel sounds active all four quadrants,  no guarding or peritoneal signs.  Muscular skeletal:  Limited exam - in bed, able to move all 4 extremities, Normal strength,  2+ pulses,  symmetric, +1 pitting edema  Skin:  Dry, warm to touch, negative for any Rashes, No open wounds  Wounds: Please see nursing documentation               Data Reviewed: I have personally reviewed following labs and imaging  studies  CBC: Recent Labs  Lab 08/23/19 0941 08/26/19 0554  WBC 6.1 5.1  HGB 9.8* 9.4*  HCT 32.6* 32.3*  MCV 76.5* 80.8  PLT 468* 409   Basic Metabolic Panel: Recent Labs  Lab 08/23/19 0941 08/24/19 0419 08/25/19 0551 08/26/19 0554  NA 139 139 139 140  K 4.4 3.7 3.4* 3.8  CL 99 101 100 100  CO2 _0 GLUCOSE 86 88 121* 89  BUN 43* 40* 41* 39*  CREATININE 1.96* 1.93* 1.93* 1.93*  CALCIUM 9.3 9.1 9.0 9.3  MG 2.6*  --   --  2.2       Radiology Studies: No results found.      Scheduled Meds: . allopurinol  100 mg Oral Daily  . aspirin EC  81 mg Oral Daily  . atorvastatin  20 mg Oral Daily  . benztropine  0.25 mg Oral BID  . calcium carbonate  1 tablet Oral TID WC  . carvedilol  3.125 mg Oral BID WC  . dextromethorphan-guaiFENesin  1 tablet Oral BID  . feeding supplement (ENSURE ENLIVE)  237 mL Oral BID BM  . ferrous sulfate  325 mg Oral Q breakfast  . FLUoxetine  30 mg Oral Daily  . furosemide  20 mg Intravenous Once  . gabapentin  300 mg Oral TID  . magnesium oxide  400 mg Oral Daily  . midodrine  2.5 mg Oral TID WC  . multivitamin with minerals  1 tablet Oral Daily  . rivaroxaban  20 mg Oral Q supper  . sodium chloride flush  3 mL Intravenous Once  . thiothixene  1 mg Oral BID  . torsemide  20 mg Oral BID   Continuous Infusions:   LOS: 31 days    Time spent: 25 minutes    Deatra James, MD [facility]   To contact the attending provider between 7A-7P or the covering provider during after hours 7P-7A, please log into the web site [email or web address] and access using universal [place] password for that web site. If you do not have the password, please call the hospital operator.  08/27/2019, 3:16 PM

## 2019-08-28 LAB — COMPREHENSIVE METABOLIC PANEL
ALT: 12 U/L (ref 0–44)
AST: 21 U/L (ref 15–41)
Albumin: 2.8 g/dL — ABNORMAL LOW (ref 3.5–5.0)
Alkaline Phosphatase: 215 U/L — ABNORMAL HIGH (ref 38–126)
Anion gap: 10 (ref 5–15)
BUN: 38 mg/dL — ABNORMAL HIGH (ref 8–23)
CO2: 26 mmol/L (ref 22–32)
Calcium: 8.9 mg/dL (ref 8.9–10.3)
Chloride: 102 mmol/L (ref 98–111)
Creatinine, Ser: 1.7 mg/dL — ABNORMAL HIGH (ref 0.44–1.00)
GFR calc Af Amer: 37 mL/min — ABNORMAL LOW (ref >60)
GFR calc non Af Amer: 32 mL/min — ABNORMAL LOW (ref >60)
Glucose, Bld: 121 mg/dL — ABNORMAL HIGH (ref 70–99)
Potassium: 3.5 mmol/L (ref 3.5–5.1)
Sodium: 138 mmol/L (ref 135–145)
Total Bilirubin: 1.4 mg/dL — ABNORMAL HIGH (ref 0.3–1.2)
Total Protein: 6.6 g/dL (ref 6.5–8.1)

## 2019-08-28 LAB — BRAIN NATRIURETIC PEPTIDE: B Natriuretic Peptide: 3245.7 pg/mL — ABNORMAL HIGH (ref 0.0–100.0)

## 2019-08-28 MED ORDER — SACUBITRIL-VALSARTAN 49-51 MG PO TABS
1.0000 | ORAL_TABLET | Freq: Two times a day (BID) | ORAL | Status: DC
  Administered 2019-08-28 – 2019-09-02 (×10): 1 via ORAL
  Filled 2019-08-28 (×12): qty 1

## 2019-08-28 MED ORDER — NICOTINE 14 MG/24HR TD PT24
14.0000 mg | MEDICATED_PATCH | Freq: Every day | TRANSDERMAL | Status: DC
  Administered 2019-08-28 – 2019-09-02 (×5): 14 mg via TRANSDERMAL
  Filled 2019-08-28 (×6): qty 1
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
## 2019-08-28 NOTE — Progress Notes (Signed)
Mobility Specialist - Progress Note   08/28/19 1538  Mobility  Activity Ambulated in hall  Level of Assistance Contact guard assist, steadying assist  Assistive Device Front wheel walker  Distance Ambulated (ft) 260 ft  Mobility Response Tolerated well  Mobility performed by Mobility specialist  $Mobility charge 1 Mobility    Pre-mobility: 62 HR, 124/88 BP, 100% SpO2 Post-mobility: 98% HR, 127/90 BP, 98% SpO2   Pt was sitting EOB upon arrival. Pt agreed to session. Pt was SBA in sit-to-stand and CGA while ambulating with RW. Pt ambulated 220' before taking a seated break. Pt denied any SOB, and was motivated to walk as far as possible before taking a break. Overall, pt tolerated session well. Pt was left EOB with phone/call bell in reach.    Kathee Delton Mobility Specialist 08/28/19, 3:43 PM

## 2019-08-28 NOTE — TOC Progression Note (Signed)
Transition of Care [facility]) - Progression Note    Patient Details  Name: Katelyn Lane MRN: [id number] Date of Birth: [birth year]  Transition [facility]) CM/SW Contact  Beverly Sessions, RN Phone Number: 08/28/2019, 3:43 PM  Clinical Narrative:      Confirmed with Allayne Butcher [phone] that patient has SNF benefits.  TOC used portal to confirm which facilites are in [employer] center, [place], [place], [place], [place], [place], [place], [place], [place], [place].  Referral sent out with notification that patient needs skilled for management of Heart Failure, medication management, and therapy   Per Nira Conn, once a bed has been offered and accepted, Josem Kaufmann will need to be started       Expected Discharge Plan and Services                                                 Social Determinants of Health (SDOH) Interventions    Readmission Risk Interventions Readmission Risk Prevention Plan 08/14/2019 07/27/2019 03/27/2019  Transportation Screening Complete Complete Complete  Medication Review [employer]) Referral to Pharmacy - (No Data)  PCP or Specialist appointment within 3-5 days of discharge Complete Complete Complete  HRI or Home Care Consult Complete Complete Complete  SW Recovery Care/Counseling Consult Complete Complete Complete  Palliative Care Screening Not Applicable Not Applicable Not [place] Not Applicable Not Applicable Not Applicable  Some recent data might be hidden

## 2019-08-28 NOTE — Progress Notes (Signed)
PROGRESS NOTE    Katelyn Lane  KGY:[id number] DOB: 07/09/1955 DOA: 07/26/2019 PCP: [name], MD   Chief complaint.  Leg edema and shortness of breath.    Prolonged length of stay due to difficulty disposition due to cognitive impairment-PSA involvement pending guardianship   Brief Narrative:  64 year old female with known history of chronic systolic heart failure, recent COVID-19 infection, hyperlipidemia and hypertension, stage IIIb chronic kidney disease presented to the emergency room 07/27/2019 with acute onset of worsening dyspnea associated with dry cough wheezing, orthopnea and PND as well lower extremity edema for 1 week. Patient had previous admissions with noncompliance. She smokes cocaine and she is unfortunately homeless. Admitted for acute on chronic systolic and diastolic CHF and AKI on sage IIIb CKD. The patient had a 2D echo on 06/22/2019 that revealed an EF of 25 to 30% with grade 3 diastolic dysfunction, severe left atrial and right atrial dilatation as well as mild mitral regurgitation. Chest x-ray showed cardiomegaly and interstitial pulmonary edema. Patient is 26days in the hospital today due to disposition problems.  8/14. Patient still has significant anasarca with the severe leg edema. Increase torsemide to 40 mg daily. Monitor renal function. Also obtain right upper quadrant ultrasound to rule out liver cirrhosis.  8/15.Discussed with social worker, patient has been refused all to help from the hospital. We will evaluate her decision making capacity. 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   8/16.  Leg edema is better, renal function improving after giving 40 mg of torsemide yesterday, continue torsemide at 20 mg twice a day.  8/17.  Patient is deemed to be not competent to make her own decisions by psychiatry.  APS is involved.  Social worker is exploring option for discharge.    8/18/patient apparently deemed  incompetent unable to make decision by her son by psych.  APS still involved social worker still working on discharge -continue to diurese on IV Lasix, tapering down due to increasing creatinine   08/27/2019-patient was seen still complaining of shortness of breath -especially with exertion  08/28/2019 -patient reporting improved shortness of breath, actually was weaned off supplemental oxygen--currently satting 98% Patient shortness of breath tend to get worse in afternoons  Subjective: The patient was seen and examined this morning, easily arousable, in no acute distress Reporting improved shortness of breath, denies any chest pain Patient was found off oxygen, satting 95-98% on room air    Assessment & Plan:   Active Problems:   Hyperlipidemia   Depression   CKD (chronic kidney disease), stage III   Acute metabolic encephalopathy   History of substance abuse (HCC)   Chronic obstructive pulmonary disease (HCC)   Pulmonary hypertension (HCC)   Acute on chronic combined systolic (congestive) and diastolic (congestive) heart failure (HCC)   History of anemia due to chronic kidney disease   Prolonged QT interval   Acute CHF (congestive heart failure) (HCC)  Acute on chronic respiratory failure -Multifactorial including acute on chronic systolic/diastolic congestive heart failure, pulmonary hypertension -Patient has been successfully been weaned off supplemental oxygen, 4-2, now room air satting 95-98% Continue treating underlying cause -specifically heart failure  Acute on chronic combined systolic and diastolic congestive heart failure with moderate pulmonary hypertension. Patient still has some volume overload,  Diuretics to be continued at torsemide 20 mg p.o. twice daily Status post additional dose of IV Lasix 08/27/2019 Obtaining a chest x-ray revealing congestion, atelectasis,?  PNA proBNP elevated > 3245.75     Acute kidney injury  on chronic kidney disease stage  IIIb. Renal function has been -elevated creatinine 1.93- Seems to be at baseline Monitoring closely  Anemia of chronic disease with iron deficient anemia. Received multiple doses of IV iron, hemoglobin has been stable.  Metabolic encephalopathy. -At baseline, Mental status waxes and wanes -monitoring -Poor insight   History of ventricular thrombus. Continue Xarelto.  Lacking of decision making capacity. APS has involved.... To determine guardianship Psych was consulted confirmed patient has lacked capacity of decision-making   DVT prophylaxis:Xarelto Code Status:Full Family Communication:None Disposition Plan:  Patient came from:  Anticipated d/c place: SNF   Barriers to d/c OR conditions which need to be met to effect a safe d/c: Patient is deemed not to be able to make her own decision, will need to refer to adult protection agency and appoint a guardian.  Per psych patient does not have capacity.  Patient is currently homeless, and there is not a safe discharge disposition    Consultants:  Nephrology and cardiology  Procedures: Antimicrobials: None  Objective: Vitals:   08/28/19 0442 08/28/19 0849 08/28/19 0918 08/28/19 1209  BP: 99/75 113/87 110/71 (!) 119/95  Pulse: 88 (!) 102 69 67  Resp: $Remo'18 20 18 19  'tJtsi$ Temp: 98.1 F (36.7 C) 97.7 F (36.5 C) 97.8 F (36.6 C) 97.9 F (36.6 C)  TempSrc:  Oral Oral Oral  SpO2: 98% 100% 95% 98%  Weight:      Height:       No intake or output data in the 24 hours ending 08/28/19 1419 Filed Weights   08/22/19 0456 08/24/19 0431 08/27/19 0522  Weight: 94.5 kg 95.7 kg 97 kg      Physical Exam:   General:  Alert, oriented, cooperative, no distress; shortness of breath with exertion  HEENT:  Normocephalic, PERRL, otherwise with in Normal limits   Neuro:  CNII-XII intact. , normal motor and  sensation, reflexes intact   Lungs:   Clear to auscultation BL, Respirations unlabored, no wheezes / crackles, positive rhonchi  Cardio:    S1/S2, RRR, No murmure, No Rubs or Gallops   Abdomen:   Soft, non-tender, bowel sounds active all four quadrants,  no guarding or peritoneal signs.  Muscular skeletal:  Limited exam - in bed, able to move all 4 extremities, Normal strength,  2+ pulses,  symmetric,  +1  pitting edema  Skin:  Dry, warm to touch, negative for any Rashes, No open wounds  Wounds: Please see nursing documentation             Data Reviewed: I have personally reviewed following labs and imaging studies  CBC: Recent Labs  Lab 08/23/19 0941 08/26/19 0554  WBC 6.1 5.1  HGB 9.8* 9.4*  HCT 32.6* 32.3*  MCV 76.5* 80.8  PLT 468* 060   Basic Metabolic Panel: Recent Labs  Lab 08/23/19 0941 08/24/19 0419 08/25/19 0551 08/26/19 0554 08/28/19 0945  NA 139 139 139 140 138  K 4.4 3.7 3.4* 3.8 3.5  CL 99 101 100 100 102  CO2 $Re'27 27 28 26 26  'Sel$ GLUCOSE 86 88 121* 89 121*  BUN 43* 40* 41* 39* 38*  CREATININE 1.96* 1.93* 1.93* 1.93* 1.70*  CALCIUM 9.3 9.1 9.0 9.3 8.9  MG 2.6*  --   --  2.2  --        Radiology Studies: DG Chest 2 View  Result Date: 08/27/2019 CLINICAL DATA:  Shortness of breath. Additional history provided: Chronic systolic heart failure, recent COVID-19 infection, hyperlipidemia, hypertension, chronic  kidney disease, worsening dyspnea. EXAM: CHEST - 2 VIEW COMPARISON:  CT angiogram chest 08/15/2019. Chest radiograph 07/26/2019 FINDINGS: Cardiomegaly with central pulmonary vascular congestion. Persistent vague left basilar opacity. The right lung is clear. No evidence of pneumothorax. No acute bony abnormality is identified. IMPRESSION: Cardiomegaly with central pulmonary vascular congestion. Persistent vague left basilar opacity which may reflect atelectasis or pneumonia. Electronically Signed   By: Kellie Simmering DO   On: 08/27/2019 18:44         Scheduled Meds: . allopurinol  100 mg Oral Daily  . aspirin EC  81 mg Oral Daily  . atorvastatin  20 mg Oral Daily  . benztropine  0.25 mg Oral BID  . calcium carbonate  1 tablet Oral TID WC  . carvedilol  3.125 mg Oral BID WC  . dextromethorphan-guaiFENesin  1 tablet Oral BID  . feeding supplement (ENSURE ENLIVE)  237 mL Oral BID BM  . ferrous sulfate  325 mg Oral Q breakfast  . FLUoxetine  30 mg Oral Daily  . gabapentin  300 mg Oral TID  . magnesium oxide  400 mg Oral Daily  . midodrine  2.5 mg Oral TID WC  . multivitamin with minerals  1 tablet Oral Daily  . rivaroxaban  20 mg Oral Q supper  . sacubitril-valsartan  1 tablet Oral BID  . sodium chloride flush  3 mL Intravenous Once  . thiothixene  1 mg Oral BID  . torsemide  20 mg Oral BID   Continuous Infusions:   LOS: 32 days    Time spent: 25 minutes    Deatra James, MD [facility]   To contact the attending provider between 7A-7P or the covering provider during after hours 7P-7A, please log into the web site [email or web address] and access using universal [place] password for that web site. If you do not have the password, please call the hospital operator.  08/28/2019, 2:19 PM

## 2019-08-28 NOTE — Progress Notes (Signed)
Physical Therapy Treatment Patient Details Name: Brittnie Lewey MRN: [id number] DOB: 1955/08/02 Today's Date: 08/28/2019    History of Present Illness 64 year old female with known history of chronic systolic heart failure, recent COVID-19 infection, hyperlipidemia and hypertension, stage IIIb chronic kidney disease presented to the emergency room 07/27/2019 with acute onset of worsening dyspnea associated with dry cough wheezing, orthopnea and PND as well lower extremity edema for 1 week.  Patient had previous admissions with noncompliance.  She smokes cocaine and she is unfortunately homeless. Admitted for acute on chronic systolic and diastolic CHF and AKI on sage IIIb CKD. The patient had a 2D echo on 06/22/2019 that revealed an EF of 25 to 30% with grade 3 diastolic dysfunction, severe left atrial and right atrial dilatation as well as mild mitral regurgitation. Chest x-ray showed cardiomegaly and interstitial pulmonary edema.    PT Comments    Pt received seated at EOB with nursing in room.  Pt agreeable to therapy.  Pt was able to transfer with supervision to standing position.  Pt able to walk halfway around nursing station until pt self reported SOB, and needed a standing rest break.  Pt then reported mild pain in her feet after arriving back to room.  Pt SpO2 stats were attempted to be monitored during session, but reading was unable to be obtained until seated EOB in room.  SpO2 reading was 80% and nursing notified.  Nursing was present and was going to be performing a breathing treatment at conclusion of session.  Current discharge plans to HHPT remain appropriate at this time.  Pt will continue to benefit from skilled therapy in order to address deficits listed below.     Follow Up Recommendations  Supervision/Assistance - 24 hour;Home health PT     Equipment Recommendations  Rolling walker with 5" wheels    Recommendations for Other Services       Precautions / Restrictions  Precautions Precautions: Fall Restrictions Weight Bearing Restrictions: No    Mobility  Bed Mobility               General bed mobility comments: not observed this session as patient sitting up pre and post session   Transfers   Equipment used: Rolling walker (2 wheeled) Transfers: Sit to/from Stand Sit to Stand: Supervision        Lateral/Scoot Transfers: Min guard General transfer comment: supervision provided for transfers within room.  Ambulation/Gait Ambulation/Gait assistance: Min guard Gait Distance (Feet): 160 Feet Assistive device: Rolling walker (2 wheeled) Gait Pattern/deviations: Step-through pattern;Trunk flexed Gait velocity: decreased   General Gait Details: pt required 2 standing rest breaks during ambulation due to mild shortness of breath.  O2 was not utilized after speaking with nurse and saying she did not require it to walk.   Stairs             Wheelchair Mobility    Modified Rankin (Stroke Patients Only)       Balance Overall balance assessment: Needs assistance Sitting-balance support: No upper extremity supported;Feet supported Sitting balance-Leahy Scale: Good     Standing balance support: During functional activity;Bilateral upper extremity supported Standing balance-Leahy Scale: Good                              Cognition Arousal/Alertness: Awake/alert Behavior During Therapy: WFL for tasks assessed/performed Overall Cognitive Status: History of cognitive impairments - at baseline  General Comments: patient cooperative and agreeable during PT session       Exercises      General Comments        Pertinent Vitals/Pain Pain Assessment: No/denies pain Pain Score: 0-No pain Pain Location: vague pain reported in bilateral feet with ambulation at conclusion of session. Pain Descriptors / Indicators: Grimacing;Aching;Sore    Home Living                       Prior Function            PT Goals (current goals can now be found in the care plan section) Acute Rehab PT Goals Patient Stated Goal: to be more independent  PT Goal Formulation: With patient Time For Goal Achievement: 09/10/19 Potential to Achieve Goals: Good Progress towards PT goals: Progressing toward goals    Frequency    Min 2X/week      PT Plan Current plan remains appropriate    Co-evaluation              AM-PAC PT "6 Clicks" Mobility   Outcome Measure  Help needed turning from your back to your side while in a flat bed without using bedrails?: A Little Help needed moving from lying on your back to sitting on the side of a flat bed without using bedrails?: A Little Help needed moving to and from a bed to a chair (including a wheelchair)?: A Little Help needed standing up from a chair using your arms (e.g., wheelchair or bedside chair)?: A Little Help needed to walk in hospital room?: A Little Help needed climbing 3-5 steps with a railing? : A Little 6 Click Score: 18    End of Session Equipment Utilized During Treatment: Gait belt Activity Tolerance: Patient tolerated treatment well Patient left: Other (comment);with nursing/sitter in room (sitting up on edge of bed at end of session ) Nurse Communication: Mobility status PT Visit Diagnosis: Unsteadiness on feet (R26.81);Muscle weakness (generalized) (M62.81);Difficulty in walking, not elsewhere classified (R26.2)     Time: 4496-7591 PT Time Calculation (min) (ACUTE ONLY): 14 min  Charges:  $Gait Training: 8-22 mins                      Gwenlyn Saran, PT, DPT 08/28/19, 4:55 PM

## 2019-08-29 NOTE — Progress Notes (Signed)
PROGRESS NOTE    Katelyn Lane  ZOX:[id number] DOB: 18-Feb-1955 DOA: 07/26/2019 PCP: [name], MD   Chief complaint.  Leg edema and shortness of breath.    Prolonged length of stay due to difficulty disposition due to cognitive impairment-PSA involvement pending guardianship   Brief Narrative:  64 year old female with known history of chronic systolic heart failure, recent COVID-19 infection, hyperlipidemia and hypertension, stage IIIb chronic kidney disease presented to the emergency room 07/27/2019 with acute onset of worsening dyspnea associated with dry cough wheezing, orthopnea and PND as well lower extremity edema for 1 week. Patient had previous admissions with noncompliance. She smokes cocaine and she is unfortunately homeless. Admitted for acute on chronic systolic and diastolic CHF and AKI on sage IIIb CKD. The patient had a 2D echo on 06/22/2019 that revealed an EF of 25 to 30% with grade 3 diastolic dysfunction, severe left atrial and right atrial dilatation as well as mild mitral regurgitation. Chest x-ray showed cardiomegaly and interstitial pulmonary edema. Patient is 26days in the hospital today due to disposition problems.  8/14. Patient still has significant anasarca with the severe leg edema. Increase torsemide to 40 mg daily. Monitor renal function. Also obtain right upper quadrant ultrasound to rule out liver cirrhosis.  8/15.Discussed with social worker, patient has been refused all to help from the hospital. We will evaluate her decision making capacity. 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   8/16.  Leg edema is better, renal function improving after giving 40 mg of torsemide yesterday, continue torsemide at 20 mg twice a day.  8/17.  Patient is deemed to be not competent to make her own decisions by psychiatry.  APS is involved.  Social worker is exploring option for discharge.    8/18/patient apparently deemed  incompetent unable to make decision by her son by psych.  APS still involved social worker still working on discharge -continue to diurese on IV Lasix, tapering down due to increasing creatinine   08/27/2019-patient was seen still complaining of shortness of breath -especially with exertion  08/28/2019 -patient reporting improved shortness of breath, actually was weaned off supplemental oxygen--currently satting 98% Patient shortness of breath tend to get worse in afternoons  08/29/2019 -stable on room air no issues overnight  Subjective: The patient was seen and examined stable no acute distress Remains on room air satting 97%   Assessment & Plan:   Active Problems:   Hyperlipidemia   Depression   CKD (chronic kidney disease), stage III   Acute metabolic encephalopathy   History of substance abuse (HCC)   Chronic obstructive pulmonary disease (HCC)   Pulmonary hypertension (HCC)   Acute on chronic combined systolic (congestive) and diastolic (congestive) heart failure (HCC)   History of anemia due to chronic kidney disease   Prolonged QT interval   Acute CHF (congestive heart failure) (HCC)  Acute on chronic respiratory failure -Multifactorial including acute on chronic systolic/diastolic congestive heart failure, pulmonary hypertension -Much improved Taper down from O2 supplement to room air, currently satting 97% Continue treating underlying cause -specifically heart failure  Acute on chronic combined systolic and diastolic congestive heart failure with moderate pulmonary hypertension. -Improved respiratory distress, improving volume overload,  Diuretics to be continued at torsemide 20 mg p.o. twice daily Status post additional dose of IV Lasix 08/27/2019 Obtaining a chest x-ray revealing congestion, atelectasis,?  PNA proBNP elevated > 3245.75   Acute kidney injury on chronic kidney disease stage IIIb. Renal function has been -elevated creatinine 1.93-  Seems to be at  baseline Monitoring closely  Anemia of chronic disease with iron deficient anemia. Received multiple doses of IV iron, hemoglobin has been stable. -Continue iron supplements  Metabolic encephalopathy. -Remain at baseline Mental status waxes and wanes -monitoring -Poor insight   History of ventricular thrombus. Continue Xarelto.  Lacking of decision making capacity. APS has involved.... To determine guardianship Psych was consulted confirmed patient has lacked capacity of decision-making   DVT prophylaxis:Xarelto Code Status:Full Family Communication:None Disposition Plan:  Patient came from:  Anticipated d/c place: SNF  Barriers -difficulty discharge due to lack of decision-making, care of herself, pending APS guardianship assignment... Likely will need SNF  Patient is medically stable to be discharged to secure facility.  Per psych patient does not have capacity.  Patient is currently homeless, and there is not a safe discharge disposition    Consultants:  Nephrology and cardiology  Procedures: Antimicrobials: None  Objective: Vitals:   08/28/19 1601 08/28/19 1943 08/29/19 0434 08/29/19 1147  BP: (!) 118/94 109/81 103/65 95/78  Pulse: 67 65 63 70  Resp: 20 18 16 16   Temp:  97.7 F (36.5 C) 97.9 F (36.6 C) 98.1 F (36.7 C)  TempSrc:  Oral Oral Oral  SpO2: 94% 98% 97% 97%  Weight:      Height:        Intake/Output Summary (Last 24 hours) at 08/29/2019 1309 Last data filed at 08/29/2019 0900 Gross per 24 hour  Intake 240 ml  Output --  Net 240 ml   Filed Weights   08/22/19 0456 08/24/19 0431 08/27/19 0522  Weight: 94.5 kg 95.7 kg 97 kg       Physical Exam:   General:  Alert, oriented, cooperative, no distress;   HEENT:  Normocephalic, PERRL, otherwise with in Normal limits   Neuro:  CNII-XII intact. , normal motor  and sensation, reflexes intact   Lungs:   Clear to auscultation BL, Respirations unlabored, no wheezes / crackles  Cardio:    S1/S2, RRR, No murmure, No Rubs or Gallops   Abdomen:   Soft, non-tender, bowel sounds active all four quadrants,  no guarding or peritoneal signs.  Muscular skeletal:  Limited exam - in bed, able to move all 4 extremities, Normal strength,  2+ pulses,  symmetric, +2 pitting edema  Skin:  Dry, warm to touch, negative for any Rashes, No open wounds  Wounds: Please see nursing documentation                Data Reviewed: I have personally reviewed following labs and imaging studies  CBC: Recent Labs  Lab 08/23/19 0941 08/26/19 0554  WBC 6.1 5.1  HGB 9.8* 9.4*  HCT 32.6* 32.3*  MCV 76.5* 80.8  PLT 468* 024   Basic Metabolic Panel: Recent Labs  Lab 08/23/19 0941 08/24/19 0419 08/25/19 0551 08/26/19 0554 08/28/19 0945  NA 139 139 139 140 138  K 4.4 3.7 3.4* 3.8 3.5  CL 99 101 100 100 102  CO2 27 27 28 26 26   GLUCOSE 86 88 121* 89 121*  BUN 43* 40* 41* 39* 38*  CREATININE 1.96* 1.93* 1.93* 1.93* 1.70*  CALCIUM 9.3 9.1 9.0 9.3 8.9  MG 2.6*  --   --  2.2  --        Radiology Studies: DG Chest 2 View  Result Date: 08/27/2019 CLINICAL DATA:  Shortness of breath. Additional history provided: Chronic systolic heart failure, recent COVID-19 infection, hyperlipidemia, hypertension, chronic kidney disease, worsening dyspnea. EXAM: CHEST -  2 VIEW COMPARISON:  CT angiogram chest 08/15/2019. Chest radiograph 07/26/2019 FINDINGS: Cardiomegaly with central pulmonary vascular congestion. Persistent vague left basilar opacity. The right lung is clear. No evidence of pneumothorax. No acute bony abnormality is identified. IMPRESSION: Cardiomegaly with central pulmonary vascular congestion. Persistent vague left basilar opacity which may reflect atelectasis or pneumonia. Electronically Signed   By: Kellie Simmering DO   On: 08/27/2019 18:44        Scheduled  Meds: . allopurinol  100 mg Oral Daily  . aspirin EC  81 mg Oral Daily  . atorvastatin  20 mg Oral Daily  . benztropine  0.25 mg Oral BID  . calcium carbonate  1 tablet Oral TID WC  . carvedilol  3.125 mg Oral BID WC  . dextromethorphan-guaiFENesin  1 tablet Oral BID  . feeding supplement (ENSURE ENLIVE)  237 mL Oral BID BM  . ferrous sulfate  325 mg Oral Q breakfast  . FLUoxetine  30 mg Oral Daily  . gabapentin  300 mg Oral TID  . magnesium oxide  400 mg Oral Daily  . midodrine  2.5 mg Oral TID WC  . multivitamin with minerals  1 tablet Oral Daily  . nicotine  14 mg Transdermal Daily  . rivaroxaban  20 mg Oral Q supper  . sacubitril-valsartan  1 tablet Oral BID  . sodium chloride flush  3 mL Intravenous Once  . thiothixene  1 mg Oral BID  . torsemide  20 mg Oral BID   Continuous Infusions:   LOS: 33 days    Time spent: 25 minutes    Deatra James, MD [facility]   To contact the attending provider between 7A-7P or the covering provider during after hours 7P-7A, please log into the web site [email or web address] and access using universal [place] password for that web site. If you do not have the password, please call the hospital operator.  08/29/2019, 1:09 PM

## 2019-08-30 LAB — CBC
HCT: 31.8 % — ABNORMAL LOW (ref 36.0–46.0)
Hemoglobin: 9.8 g/dL — ABNORMAL LOW (ref 12.0–15.0)
MCH: 24.4 pg — ABNORMAL LOW (ref 26.0–34.0)
MCHC: 30.8 g/dL (ref 30.0–36.0)
MCV: 79.1 fL — ABNORMAL LOW (ref 80.0–100.0)
Platelets: 307 10*3/uL (ref 150–400)
RBC: 4.02 MIL/uL (ref 3.87–5.11)
RDW: 28.4 % — ABNORMAL HIGH (ref 11.5–15.5)
WBC: 5.6 10*3/uL (ref 4.0–10.5)
nRBC: 0 % (ref 0.0–0.2)

## 2019-08-30 NOTE — Progress Notes (Signed)
PROGRESS NOTE    Katelyn Lane  JGO:[id number] DOB: 09/20/1955 DOA: 07/26/2019 PCP: [name], MD   Chief complaint.  Leg edema and shortness of breath.    Prolonged length of stay due to difficulty disposition due to cognitive impairment-PSA involvement pending guardianship   Brief Narrative:  64 year old female with known history of chronic systolic heart failure, recent COVID-19 infection, hyperlipidemia and hypertension, stage IIIb chronic kidney disease presented to the emergency room 07/27/2019 with acute onset of worsening dyspnea associated with dry cough wheezing, orthopnea and PND as well lower extremity edema for 1 week. Patient had previous admissions with noncompliance. She smokes cocaine and she is unfortunately homeless. Admitted for acute on chronic systolic and diastolic CHF and AKI on sage IIIb CKD. The patient had a 2D echo on 06/22/2019 that revealed an EF of 25 to 30% with grade 3 diastolic dysfunction, severe left atrial and right atrial dilatation as well as mild mitral regurgitation. Chest x-ray showed cardiomegaly and interstitial pulmonary edema. Patient is 26days in the hospital today due to disposition problems.  8/14. Patient still has significant anasarca with the severe leg edema. Increase torsemide to 40 mg daily. Monitor renal function. Also obtain right upper quadrant ultrasound to rule out liver cirrhosis.  8/15.Discussed with social worker, patient has been refused all to help from the hospital. We will evaluate her decision making capacity. 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   8/16.  Leg edema is better, renal function improving after giving 40 mg of torsemide yesterday, continue torsemide at 20 mg twice a day.  8/17.  Patient is deemed to be not competent to make her own decisions by psychiatry.  APS is involved.  Social worker is exploring option for discharge.    8/18/patient apparently deemed  incompetent unable to make decision by her son by psych.  APS still involved social worker still working on discharge -continue to diurese on IV Lasix, tapering down due to increasing creatinine   08/27/2019-patient was seen still complaining of shortness of breath -especially with exertion  08/28/2019 -patient reporting improved shortness of breath, actually was weaned off supplemental oxygen--currently satting 98% Patient shortness of breath tend to get worse in afternoons  08/29/2019 -stable on room air, continues to complain of lower extremity pain, edema has improved  08/31/2019 -remained stable on room air, no issues overnight  Subjective: The patient was seen and examined, remained stable on room air, reporting improved lower extremity edema, but still tenderness.. No issues overnight    Assessment & Plan:   Active Problems:   Hyperlipidemia   Depression   CKD (chronic kidney disease), stage III   Acute metabolic encephalopathy   History of substance abuse (HCC)   Chronic obstructive pulmonary disease (HCC)   Pulmonary hypertension (HCC)   Acute on chronic combined systolic (congestive) and diastolic (congestive) heart failure (HCC)   History of anemia due to chronic kidney disease   Prolonged QT interval   Acute CHF (congestive heart failure) (HCC)  Acute on chronic respiratory failure -Resolved -Multifactorial including acute on chronic systolic/diastolic congestive heart failure, pulmonary hypertension -Much improved weaned off supplemental oxygen, on room air satting 97%  Acute on chronic combined systolic and diastolic congestive heart failure with moderate pulmonary hypertension. -Stable -continue diuretics Diuretics to be continued at torsemide 20 mg p.o. twice daily Status post additional dose of IV Lasix 08/27/2019 Obtaining a chest x-ray revealing congestion, atelectasis,?  PNA proBNP elevated > 3245.75  Acute kidney injury on chronic  kidney disease stage  IIIb. Renal function has been -elevated creatinine 1.93- Seems to be at baseline Monitoring  Anemia of chronic disease with iron deficient anemia. Stable  Received multiple doses of IV iron, hemoglobin has been stable. -Continue iron supplements  Metabolic encephalopathy. -Resolved  Mental status waxes and wanes -monitoring -Poor insight   History of ventricular thrombus. Continue Xarelto.  Lacking of decision making capacity. APS has involved.... To determine guardianship Psych was consulted confirmed patient has lacked capacity of decision-making   DVT prophylaxis:Xarelto Code Status:Full Family Communication:None Disposition Plan:  Patient came from:  Anticipated d/c place: SNF  Barriers -difficulty discharge due to lack of decision-making, care of herself, pending APS guardianship assignment... Likely will need SNF  Patient is medically stable to be discharged to secure facility.  Per psych patient does not have capacity.  Patient is currently homeless, and there is not a safe discharge disposition    Consultants:  Nephrology and cardiology  Procedures: Antimicrobials: None  Objective: Vitals:   08/29/19 1147 08/29/19 2041 08/30/19 0540 08/30/19 1309  BP: 95/78 115/87 107/76 102/71  Pulse: 70 67 70 69  Resp: 16  18 16   Temp: 98.1 F (36.7 C) 98 F (36.7 C) 98.6 F (37 C) 98.3 F (36.8 C)  TempSrc: Oral Oral Oral Oral  SpO2: 97% 97% 100% 100%  Weight:      Height:       No intake or output data in the 24 hours ending 08/30/19 1318 Filed Weights   08/22/19 0456 08/24/19 0431 08/27/19 0522  Weight: 94.5 kg 95.7 kg 97 kg       Physical Exam:   General:  Alert, oriented, cooperative, no distress;   HEENT:  Normocephalic, PERRL, otherwise with in Normal limits   Neuro:  CNII-XII intact. , normal motor and sensation,  reflexes intact   Lungs:   Clear to auscultation BL, Respirations unlabored, no wheezes / crackles  Cardio:    S1/S2, RRR, No murmure, No Rubs or Gallops   Abdomen:   Soft, non-tender, bowel sounds active all four quadrants,  no guarding or peritoneal signs.  Muscular skeletal:  Limited exam - in bed, able to move all 4 extremities, Normal strength,  2+ pulses,  symmetric, +2 pitting edema  Skin:  Dry, warm to touch, negative for any Rashes, No open wounds  Wounds: Please see nursing documentation                Data Reviewed: I have personally reviewed following labs and imaging studies  CBC: Recent Labs  Lab 08/26/19 0554 08/30/19 1127  WBC 5.1 5.6  HGB 9.4* 9.8*  HCT 32.3* 31.8*  MCV 80.8 79.1*  PLT 376 650   Basic Metabolic Panel: Recent Labs  Lab 08/24/19 0419 08/25/19 0551 08/26/19 0554 08/28/19 0945  NA 139 139 140 138  K 3.7 3.4* 3.8 3.5  CL 101 100 100 102  CO2 27 28 26 26   GLUCOSE 88 121* 89 121*  BUN 40* 41* 39* 38*  CREATININE 1.93* 1.93* 1.93* 1.70*  CALCIUM 9.1 9.0 9.3 8.9  MG  --   --  2.2  --        Radiology Studies: No results found.      Scheduled Meds: . allopurinol  100 mg Oral Daily  . aspirin EC  81 mg Oral Daily  . atorvastatin  20 mg Oral Daily  . benztropine  0.25 mg Oral BID  . calcium carbonate  1 tablet Oral TID WC  .  carvedilol  3.125 mg Oral BID WC  . dextromethorphan-guaiFENesin  1 tablet Oral BID  . feeding supplement (ENSURE ENLIVE)  237 mL Oral BID BM  . ferrous sulfate  325 mg Oral Q breakfast  . FLUoxetine  30 mg Oral Daily  . gabapentin  300 mg Oral TID  . magnesium oxide  400 mg Oral Daily  . midodrine  2.5 mg Oral TID WC  . multivitamin with minerals  1 tablet Oral Daily  . nicotine  14 mg Transdermal Daily  . rivaroxaban  20 mg Oral Q supper  . sacubitril-valsartan  1 tablet Oral BID  . sodium chloride flush  3 mL Intravenous Once  . thiothixene  1 mg Oral BID  . torsemide  20 mg Oral BID    Continuous Infusions:   LOS: 34 days    Time spent: 25 minutes    Deatra James, MD [facility]   To contact the attending provider between 7A-7P or the covering provider during after hours 7P-7A, please log into the web site [email or web address] and access using universal [place] password for that web site. If you do not have the password, please call the hospital operator.  08/30/2019, 1:18 PM

## 2019-08-31 LAB — BASIC METABOLIC PANEL
Anion gap: 11 (ref 5–15)
BUN: 38 mg/dL — ABNORMAL HIGH (ref 8–23)
CO2: 27 mmol/L (ref 22–32)
Calcium: 9.2 mg/dL (ref 8.9–10.3)
Chloride: 103 mmol/L (ref 98–111)
Creatinine, Ser: 1.55 mg/dL — ABNORMAL HIGH (ref 0.44–1.00)
GFR calc Af Amer: 41 mL/min — ABNORMAL LOW (ref >60)
GFR calc non Af Amer: 35 mL/min — ABNORMAL LOW (ref >60)
Glucose, Bld: 131 mg/dL — ABNORMAL HIGH (ref 70–99)
Potassium: 3.5 mmol/L (ref 3.5–5.1)
Sodium: 141 mmol/L (ref 135–145)

## 2019-08-31 MED ORDER — ALUM & MAG HYDROXIDE-SIMETH 200-200-20 MG/5ML PO SUSP
30.0000 mL | ORAL | Status: DC | PRN
  Administered 2019-08-31: 30 mL via ORAL
  Filled 2019-08-31: qty 30

## 2019-08-31 NOTE — Progress Notes (Signed)
PROGRESS NOTE    Katelyn Lane  WUJ:[id number] DOB: 1955-08-31 DOA: 07/26/2019 PCP: [name], MD   Chief complaint.  Leg edema and shortness of breath.    Prolonged length of stay due to difficulty disposition due to cognitive impairment-PSA involvement pending guardianship   Brief Narrative:  64 year old female with known history of chronic systolic heart failure, recent COVID-19 infection, hyperlipidemia and hypertension, stage IIIb chronic kidney disease presented to the emergency room 07/27/2019 with acute onset of worsening dyspnea associated with dry cough wheezing, orthopnea and PND as well lower extremity edema for 1 week. Patient had previous admissions with noncompliance. She smokes cocaine and she is unfortunately homeless. Admitted for acute on chronic systolic and diastolic CHF and AKI on sage IIIb CKD. The patient had a 2D echo on 06/22/2019 that revealed an EF of 25 to 30% with grade 3 diastolic dysfunction, severe left atrial and right atrial dilatation as well as mild mitral regurgitation. Chest x-ray showed cardiomegaly and interstitial pulmonary edema. Patient is 26days in the hospital today due to disposition problems.  8/14. Patient still has significant anasarca with the severe leg edema. Increase torsemide to 40 mg daily. Monitor renal function. Also obtain right upper quadrant ultrasound to rule out liver cirrhosis.  8/15.Discussed with social worker, patient has been refused all to help from the hospital. We will evaluate her decision making capacity. 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   8/16.  Leg edema is better, renal function improving after giving 40 mg of torsemide yesterday, continue torsemide at 20 mg twice a day.  8/17.  Patient is deemed to be not competent to make her own decisions by psychiatry.  APS is involved.  Social worker is exploring option for discharge.    8/18/patient apparently deemed  incompetent unable to make decision by her son by psych.  APS still involved social worker still working on discharge -continue to diurese on IV Lasix, tapering down due to increasing creatinine   08/27/2019-patient was seen still complaining of shortness of breath -especially with exertion  08/28/2019 -patient reporting improved shortness of breath, actually was weaned off supplemental oxygen--currently satting 98% Patient shortness of breath tend to get worse in afternoons  08/29/2019 -stable on room air, continues to complain of lower extremity pain, edema has improved  08/30/2019 -remained stable on room air, no issues overnight  08/31/2019 -remained stable on room air, p.o. medication, creatinine improving No issues overnight   Subjective: The patient was seen and examined, stable no acute distress remains on room air No issues overnight    Assessment & Plan:   Active Problems:   Hyperlipidemia   Depression   CKD (chronic kidney disease), stage III   Acute metabolic encephalopathy   History of substance abuse (HCC)   Chronic obstructive pulmonary disease (HCC)   Pulmonary hypertension (HCC)   Acute on chronic combined systolic (congestive) and diastolic (congestive) heart failure (HCC)   History of anemia due to chronic kidney disease   Prolonged QT interval   Acute CHF (congestive heart failure) (HCC)  Acute on chronic respiratory failure -At baseline, remained on room air satting 100% -Multifactorial including acute on chronic systolic/diastolic congestive heart failure, pulmonary hypertension -Much improved  Acute on chronic combined systolic and diastolic congestive heart failure with moderate pulmonary hypertension. -Much improved respiratory status,, remains on room air stable Diuretics to be continued at torsemide 20 mg p.o. twice daily Status post additional dose of IV Lasix 08/27/2019 Obtaining a chest x-ray revealing  congestion, atelectasis,?  PNA proBNP  elevated > 3245.75  Acute kidney injury on chronic kidney disease stage IIIb. Renal function has been -elevated creatinine 1.93- Seems to be at baseline Monitoring  Anemia of chronic disease with iron deficient anemia. -Remained stable Received multiple doses of IV iron, hemoglobin has been stable. -Continue iron supplements  Metabolic encephalopathy. -At baseline Mental status waxes and wanes -monitoring -Poor insight   History of ventricular thrombus. -No symptoms continue Xarelto. -  Lacking of decision making capacity. Patient remains awake alert, following commands, cooperative APS has involved.... To determine guardianship Psych was consulted confirmed patient has lacked capacity of decision-making   DVT prophylaxis:Xarelto Code Status:Full Family Communication:None Disposition Plan:  Patient came from:  Anticipated d/c place: SNF --appreciate social worker assist in placement Barriers -difficulty discharge due to lack of decision-making, care of herself, pending APS guardianship assignment... Likely will need SNF  Patient is medically stable to be discharged to secure facility.  Per psych patient does not have capacity.  Patient is currently homeless, and there is not a safe discharge disposition    Consultants:  Nephrology and cardiology  Procedures: Antimicrobials: None  Objective: Vitals:   08/30/19 0540 08/30/19 1309 08/30/19 2103 08/31/19 0327  BP: 107/76 102/71 98/65 116/82  Pulse: 70 69 68 68  Resp: 18 16  20   Temp: 98.6 F (37 C) 98.3 F (36.8 C)  97.9 F (36.6 C)  TempSrc: Oral Oral  Oral  SpO2: 100% 100%  100%  Weight:      Height:        Intake/Output Summary (Last 24 hours) at 08/31/2019 1342 Last data filed at 08/30/2019 1836 Gross per 24 hour  Intake 240 ml  Output --  Net 240 ml   Filed Weights    08/22/19 0456 08/24/19 0431 08/27/19 0522  Weight: 94.5 kg 95.7 kg 97 kg      Physical Exam:   General:  Alert, oriented, cooperative, no distress;   HEENT:  Normocephalic, PERRL, otherwise with in Normal limits   Neuro:  CNII-XII intact. , normal motor and sensation, reflexes intact   Lungs:   Clear to auscultation BL, Respirations unlabored, no wheezes / crackles  Cardio:    S1/S2, RRR, No murmure, No Rubs or Gallops   Abdomen:   Soft, non-tender, bowel sounds active all four quadrants,  no guarding or peritoneal signs.  Muscular skeletal:  Limited exam - in bed, able to move all 4 extremities, Normal strength,  2+ pulses,  symmetric, +1  pitting edema  Skin:  Dry, warm to touch, negative for any Rashes, No open wounds  Wounds: Please see nursing documentation      Data Reviewed: I have personally reviewed following labs and imaging studies  CBC: Recent Labs  Lab 08/26/19 0554 08/30/19 1127  WBC 5.1 5.6  HGB 9.4* 9.8*  HCT 32.3* 31.8*  MCV 80.8 79.1*  PLT 376 431   Basic Metabolic Panel: Recent Labs  Lab 08/25/19 0551 08/26/19 0554 08/28/19 0945 08/31/19 0542  NA 139 140 138 141  K 3.4* 3.8 3.5 3.5  CL 100 100 102 103  CO2 28 26 26 27   GLUCOSE 121* 89 121* 131*  BUN 41* 39* 38* 38*  CREATININE 1.93* 1.93* 1.70* 1.55*  CALCIUM 9.0 9.3 8.9 9.2  MG  --  2.2  --   --        Radiology Studies: No results found.      Scheduled Meds: . allopurinol  100 mg Oral  Daily  . aspirin EC  81 mg Oral Daily  . atorvastatin  20 mg Oral Daily  . benztropine  0.25 mg Oral BID  . calcium carbonate  1 tablet Oral TID WC  . carvedilol  3.125 mg Oral BID WC  . dextromethorphan-guaiFENesin  1 tablet Oral BID  . feeding supplement (ENSURE ENLIVE)  237 mL Oral BID BM  . ferrous sulfate  325 mg Oral Q breakfast  . FLUoxetine  30 mg Oral Daily  . gabapentin  300 mg Oral TID  . magnesium oxide  400 mg Oral Daily  . midodrine  2.5 mg Oral TID WC  . multivitamin with  minerals  1 tablet Oral Daily  . nicotine  14 mg Transdermal Daily  . rivaroxaban  20 mg Oral Q supper  . sacubitril-valsartan  1 tablet Oral BID  . sodium chloride flush  3 mL Intravenous Once  . thiothixene  1 mg Oral BID  . torsemide  20 mg Oral BID   Continuous Infusions:   LOS: 35 days    Time spent: 25 minutes    Deatra James, MD [facility]   To contact the attending provider between 7A-7P or the covering provider during after hours 7P-7A, please log into the web site [email or web address] and access using universal [place] password for that web site. If you do not have the password, please call the hospital operator.  08/31/2019, 1:42 PM

## 2019-08-31 NOTE — TOC Progression Note (Signed)
Transition of Care [facility]) - Progression Note    Patient Details  Name: Katelyn Lane MRN: [id number] Date of Birth: Jul 14, 1955  Transition [facility]) CM/SW Contact  Beverly Sessions, RN Phone Number: 08/31/2019, 2:00 PM  Clinical Narrative:     Individually reached out to facilities [facility] with patient's insurance.      -[facility] - declined - [facility] - no Medicaid beds available  -[facility] - declined  -[facility] - does not have female quarantine bed available until 09/04/19.  Debbie with [facility] to get the DON to look at case -[name] to review the case  -[facility] reviewed and unable to accept   -[facility] - No snf to LTC beds available  -[facility] due to history drug use  -[facility] - voice mail left  -[facility] - declined     [facility] leadership updated   Expected Discharge Plan and Services                                                 Social Determinants of Health (SDOH) Interventions    Readmission Risk Interventions Readmission Risk Prevention Plan 08/14/2019 07/27/2019 03/27/2019  Transportation Screening Complete Complete Complete  Medication Review [employer]) Referral to Pharmacy - (No Data)  PCP or Specialist appointment within 3-5 days of discharge Complete Complete Complete  HRI or Home Care Consult Complete Complete Complete  SW Recovery Care/Counseling Consult Complete Complete Complete  Palliative Care Screening Not Applicable Not Applicable Not [place] Not Applicable Not Applicable Not Applicable  Some recent data might be hidden

## 2019-08-31 NOTE — Progress Notes (Signed)
OT Cancellation Note  Patient Details Name: Katelyn Lane MRN: [id number] DOB: [birth year]  Re-eval attempted today. Pt was in bed with covers over her head. Stated that she had already had a long PT session and could not do anything more today. Pt stated that she would like to do OT tomorrow. Will attempt re-eval as possible on 8/24.    Cancelled Treatment:    Reason Eval/Treat Not Completed: Patient declined, no reason specified   Josiah Lobo, PhD, [place], OTR/L ascom [phone] 08/31/19, 3:52 PM

## 2019-08-31 NOTE — Progress Notes (Signed)
Mobility Specialist - Progress Note   08/31/19 1322  Mobility  Activity Refused mobility  Mobility performed by Mobility specialist     Pt sleeping in bed upon arrival. Pt easily awakens. Pt politely refuses mobility this afternoon d/t feeling sore in both ankles. Will attempt session again at a later date/time.    Cydnee Fuquay Mobility Specialist  08/31/19, 1:24 PM

## 2019-08-31 NOTE — Progress Notes (Addendum)
Physical Therapy Treatment Patient Details Name: Katelyn Lane MRN: [id number] DOB: July 19, 1955 Today's Date: 08/31/2019    History of Present Illness 64 year old female with known history of chronic systolic heart failure, recent COVID-19 infection, hyperlipidemia and hypertension, stage IIIb chronic kidney disease presented to the emergency room 07/27/2019 with acute onset of worsening dyspnea associated with dry cough wheezing, orthopnea and PND as well lower extremity edema for 1 week.  Patient had previous admissions with noncompliance.  She smokes cocaine and she is unfortunately homeless. Admitted for acute on chronic systolic and diastolic CHF and AKI on sage IIIb CKD. The patient had a 2D echo on 06/22/2019 that revealed an EF of 25 to 30% with grade 3 diastolic dysfunction, severe left atrial and right atrial dilatation as well as mild mitral regurgitation. Chest x-ray showed cardiomegaly and interstitial pulmonary edema.    PT Comments    Initially refuses session stating pain in L foot. Participated in exercises as described below.  After ex she stated she needs to use bathroom urgently.  Requests help to EOB but cues given to sit with min a x 1.  Refuses walker to commode and is able to transfer with min a x 1 requiring HHA to complete turn.  Self care without assist.  She is able to don pants on manage on her own then walk 300'  In hallway with RW and min guard.  No LOB or buckling noted but she requires +1 assist for safety.   HR ans sats stable during gait.     Follow Up Recommendations  Supervision/Assistance - 24 hour;Home health PT     Equipment Recommendations  Rolling walker with 5" wheels    Recommendations for Other Services       Precautions / Restrictions Precautions Precautions: Fall    Mobility  Bed Mobility Overal bed mobility: Needs Assistance   Rolling: Min guard         General bed mobility comments: cues for hand placements and to do on her own as  she asks for assist  Transfers Overall transfer level: Needs assistance Equipment used: Rolling walker (2 wheeled);1 person hand held assist Transfers: Sit to/from [facility] Sit to Stand: Min guard Stand pivot transfers: Min assist          Ambulation/Gait Ambulation/Gait assistance: Min guard Gait Distance (Feet): 300 Feet Assistive device: Rolling walker (2 wheeled) Gait Pattern/deviations: Step-through pattern;Trunk flexed Gait velocity: decreased   General Gait Details: no SOB noted today and sats steady on room air.   Stairs             Wheelchair Mobility    Modified Rankin (Stroke Patients Only)       Balance Overall balance assessment: Needs assistance Sitting-balance support: No upper extremity supported;Feet supported Sitting balance-Leahy Scale: Good     Standing balance support: During functional activity;Bilateral upper extremity supported Standing balance-Leahy Scale: Good                              Cognition Arousal/Alertness: Awake/alert Behavior During Therapy: WFL for tasks assessed/performed Overall Cognitive Status: History of cognitive impairments - at baseline                                 General Comments: patient cooperative and agreeable during PT session       Exercises Other Exercises Other Exercises: supine ankle  pumps, SLR, AB/add and heel slides x 10    General Comments        Pertinent Vitals/Pain Pain Assessment: Faces Faces Pain Scale: Hurts little more Pain Location: initially pain in L foot and refusing therapy but after ex she agrees and reports no pain Pain Descriptors / Indicators: Sore Pain Intervention(s): Limited activity within patient's tolerance;Monitored during session    Home Living                      Prior Function            PT Goals (current goals can now be found in the care plan section) Progress towards PT goals: Progressing  toward goals    Frequency    Min 2X/week      PT Plan Current plan remains appropriate    Co-evaluation              AM-PAC PT "6 Clicks" Mobility   Outcome Measure  Help needed turning from your back to your side while in a flat bed without using bedrails?: A Little Help needed moving from lying on your back to sitting on the side of a flat bed without using bedrails?: A Little Help needed moving to and from a bed to a chair (including a wheelchair)?: A Little Help needed standing up from a chair using your arms (e.g., wheelchair or bedside chair)?: A Little Help needed to walk in hospital room?: A Little Help needed climbing 3-5 steps with a railing? : A Little 6 Click Score: 18    End of Session Equipment Utilized During Treatment: Gait belt Activity Tolerance: Patient tolerated treatment well Patient left: Other (comment);with nursing/sitter in room (sitting up on edge of bed at end of session ) Nurse Communication: Mobility status PT Visit Diagnosis: Unsteadiness on feet (R26.81);Muscle weakness (generalized) (M62.81);Difficulty in walking, not elsewhere classified (R26.2)     Time: 1346-1410 PT Time Calculation (min) (ACUTE ONLY): 24 min  Charges:  $Gait Training: 8-22 mins $Therapeutic Activity: 8-22 mins                    Chesley Noon, PTA 08/31/19, 2:18 PM

## 2019-09-01 ENCOUNTER — Ambulatory Visit: Payer: Medicaid Other | Admitting: Family

## 2019-09-01 LAB — BASIC METABOLIC PANEL
Anion gap: 12 (ref 5–15)
BUN: 37 mg/dL — ABNORMAL HIGH (ref 8–23)
CO2: 26 mmol/L (ref 22–32)
Calcium: 9.3 mg/dL (ref 8.9–10.3)
Chloride: 101 mmol/L (ref 98–111)
Creatinine, Ser: 1.53 mg/dL — ABNORMAL HIGH (ref 0.44–1.00)
GFR calc Af Amer: 42 mL/min — ABNORMAL LOW (ref >60)
GFR calc non Af Amer: 36 mL/min — ABNORMAL LOW (ref >60)
Glucose, Bld: 118 mg/dL — ABNORMAL HIGH (ref 70–99)
Potassium: 3.9 mmol/L (ref 3.5–5.1)
Sodium: 139 mmol/L (ref 135–145)

## 2019-09-01 NOTE — Progress Notes (Signed)
Patient sitting up, walking around room, had a BM today. She has times of being relaxed and calm and times of being angry and snaps on staff

## 2019-09-01 NOTE — Progress Notes (Signed)
Nutrition Follow Up Note   DOCUMENTATION CODES:   Obesity unspecified  INTERVENTION:   Ensure Enlive po BID, each supplement provides 350 kcal and 20 grams of protein  Magic cup TID with meals, each supplement provides 290 kcal and 9 grams of protein  Continue MVI daily   NUTRITION DIAGNOSIS:   Inadequate oral intake related to acute illness as evidenced by per patient/family report.  GOAL:   Patient will meet greater than or equal to 90% of their needs  -met  MONITOR:   PO intake, Supplement acceptance, Labs, Weight trends, Skin, I & O's  ASSESSMENT:   64 year old female with known history of chronic systolic heart failure, polysubstance abuse, homelessness, recent COVID-19 infection, hyperlipidemia and hypertension, stage IIIb chronic kidney disease who is admitted with CHF   Pt continues to have good appetite and oral intake; pt eating 100% of meals and drinking some Ensure supplements. Pt is homeless and does not have a safe discharge plan currently.   Medications reviewed and include: aspirin, ferrous sulfate, Mg oxide, nicotine, MVI, torsemide  Labs reviewed: BUN 37(H), creat 1.53(H)  Diet Order:   Diet Order            Diet 2 gram sodium Room service appropriate? Yes; Fluid consistency: Thin; Fluid restriction: 1200 mL Fluid  Diet effective now                EDUCATION NEEDS:   Education needs have been addressed  Skin:  Skin Assessment: Reviewed RN Assessment (ecchymosis)  Last BM:  8/23- type 5  Height:   Ht Readings from Last 1 Encounters:  07/30/19 $RemoveB'5\' 7"'wJuEDjWe$  (1.702 m)    Weight:   Wt Readings from Last 1 Encounters:  08/27/19 97 kg    Ideal Body Weight:  61.36 kg  BMI:  Body mass index is 33.5 kg/m.  Estimated Nutritional Needs:   Kcal:  1900-2200kcal/day  Protein:  95-110g/day  Fluid:  1.8L/day or per MD  Koleen Distance MS, RD, LDN Please refer to [facility] for RD and/or RD on-call/weekend/after hours pager

## 2019-09-01 NOTE — Progress Notes (Signed)
Mobility Specialist - Progress Note   09/01/19 1352  Mobility  Activity Refused mobility  Mobility performed by Mobility specialist     Pt refused mobility this afternoon. Pt states she is having L hip pain, rating it 10/10. Will attempt session at a later date/time.      Latasha Puskas Mobility Specialist  09/01/19, 1:53 PM

## 2019-09-01 NOTE — Progress Notes (Signed)
PROGRESS NOTE    Katelyn Lane  YTW:[id number] DOB: 09/23/1955 DOA: 07/26/2019 PCP: [name], MD   Chief complaint.  Leg edema and shortness of breath.    Prolonged length of stay due to difficulty disposition due to cognitive impairment-PSA involvement pending guardianship   Brief Narrative:  64 year old female with known history of chronic systolic heart failure, recent COVID-19 infection, hyperlipidemia and hypertension, stage IIIb chronic kidney disease presented to the emergency room 07/27/2019 with acute onset of worsening dyspnea associated with dry cough wheezing, orthopnea and PND as well lower extremity edema for 1 week. Patient had previous admissions with noncompliance. She smokes cocaine and she is unfortunately homeless. Admitted for acute on chronic systolic and diastolic CHF and AKI on sage IIIb CKD. The patient had a 2D echo on 06/22/2019 that revealed an EF of 25 to 30% with grade 3 diastolic dysfunction, severe left atrial and right atrial dilatation as well as mild mitral regurgitation. Chest x-ray showed cardiomegaly and interstitial pulmonary edema. Patient is 64days in the hospital today due to disposition problems.  8/14. Patient still has significant anasarca with the severe leg edema. Increase torsemide to 40 mg daily. Monitor renal function. Also obtain right upper quadrant ultrasound to rule out liver cirrhosis.  8/15.Discussed with social worker, patient has been refused all to help from the hospital. We will evaluate her decision making capacity. 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   8/16.  Leg edema is better, renal function improving after giving 40 mg of torsemide yesterday, continue torsemide at 20 mg twice a day.  8/17.  Patient is deemed to be not competent to make her own decisions by psychiatry.  APS is involved.  Social worker is exploring option for discharge.    8/18/patient apparently deemed  incompetent unable to make decision by her son by psych.  APS still involved social worker still working on discharge -continue to diurese on IV Lasix, tapering down due to increasing creatinine   08/27/2019-patient was seen still complaining of shortness of breath -especially with exertion  08/28/2019 -patient reporting improved shortness of breath, actually was weaned off supplemental oxygen--currently satting 98% Patient shortness of breath tend to get worse in afternoons  08/29/2019 -stable on room air, continues to complain of lower extremity pain, edema has improved  08/30/2019 -remained stable on room air, no issues overnight  08/31/2019 -remained stable on room air, p.o. medication, creatinine improving No issues overnight  09/01/2019 -patient remained stable on room air no issues overnight Discussed with social worker, she has reached out over 60 SNF across the [place] no offers yet.   ---------------------------------------------------------------------------------------------------------------------------------------------  Subjective: The patient was seen and examined this morning, no changes Remains on room air No issues overnight  -----------------------------------------------------------------------------------------------------------------------------------------0--  Assessment & Plan:   Active Problems:   Hyperlipidemia   Depression   CKD (chronic kidney disease), stage III   Acute metabolic encephalopathy   History of substance abuse (HCC)   Chronic obstructive pulmonary disease (HCC)   Pulmonary hypertension (HCC)   Acute on chronic combined systolic (congestive) and diastolic (congestive) heart failure (HCC)   History of anemia due to chronic kidney disease   Prolonged QT interval   Acute CHF (congestive heart failure) (HCC)  Acute on chronic respiratory failure -Improved back to baseline on room air satting 97% -Multifactorial including acute on chronic  systolic/diastolic congestive heart failure, pulmonary hypertension -Much improved  Acute on chronic combined systolic and diastolic congestive heart failure with moderate pulmonary hypertension. -Improved  stable on room air Diuretics to be continued at torsemide 20 mg p.o. twice daily >> to be continued no changes -Last extra IV Lasix 08/27/2019  Obtaining a chest x-ray revealing congestion, atelectasis,?  PNA proBNP elevated > 3245.75  Acute kidney injury on chronic kidney disease stage IIIb. -Renal function has been -elevated creatinine 1.93- Seems to be at baseline -Monitoring continue to improve   Anemia of chronic disease with iron deficient anemia. -Remained stable Received multiple doses of IV iron, hemoglobin has been stable. -Continue iron supplements  Metabolic encephalopathy. -At baseline Mental status waxes and wanes -monitoring -Poor insight   History of ventricular thrombus. -No symptoms continue Xarelto. -  Lacking of decision making capacity. Patient remains awake alert, following commands, cooperative APS has involved.... To determine guardianship Psych was consulted confirmed patient has lacked capacity of decision-making   DVT prophylaxis:Xarelto Code Status:Full Family Communication:None Disposition Plan:  Patient came from:  Anticipated d/c place: SNF --difficult discharge  appreciate social worker assist in placement Barriers -difficulty discharge due to lack of decision-making, care of herself,  APS guardianship assignment... Need SNF -social worker has restarted over 8 SNF no offers yet...  Patient is medically stable to be discharged to secure facility.  Per psych patient does not have capacity.  Patient is currently homeless, and there is not a safe discharge disposition    Consultants:  Nephrology and  cardiology  Procedures: Antimicrobials: None  Objective: Vitals:   08/31/19 1432 08/31/19 2026 09/01/19 0508 09/01/19 1127  BP:  116/82 115/82 98/72  Pulse: 62 62 74 65  Resp:  20 20   Temp:  97.7 F (36.5 C) 99.1 F (37.3 C) 97.8 F (36.6 C)  TempSrc:  Oral Oral Oral  SpO2:  99% 98% 100%  Weight:      Height:        Intake/Output Summary (Last 24 hours) at 09/01/2019 1255 Last data filed at 09/01/2019 0900 Gross per 24 hour  Intake 240 ml  Output --  Net 240 ml   Filed Weights   08/22/19 0456 08/24/19 0431 08/27/19 0522  Weight: 94.5 kg 95.7 kg 97 kg      Physical Exam:   General:  Alert, oriented, cooperative, no distress;   HEENT:  Normocephalic, PERRL, otherwise with in Normal limits   Neuro:  CNII-XII intact. , normal motor and sensation, reflexes intact   Lungs:   Clear to auscultation BL, Respirations unlabored, no wheezes / crackles  Cardio:    S1/S2, RRR, No murmure, No Rubs or Gallops   Abdomen:   Soft, non-tender, bowel sounds active all four quadrants,  no guarding or peritoneal signs.  Muscular skeletal:  Limited exam - in bed, able to move all 4 extremities, Normal strength,  2+ pulses,  symmetric, +1  pitting edema  Skin:  Dry, warm to touch, negative for any Rashes, No open wounds  Wounds: Please see nursing documentation      Data Reviewed: I have personally reviewed following labs and imaging studies  CBC: Recent Labs  Lab 08/26/19 0554 08/30/19 1127  WBC 5.1 5.6  HGB 9.4* 9.8*  HCT 32.3* 31.8*  MCV 80.8 79.1*  PLT 376 884   Basic Metabolic Panel: Recent Labs  Lab 08/26/19 0554 08/28/19 0945 08/31/19 0542 09/01/19 0524  NA 140 138 141 139  K 3.8 3.5 3.5 3.9  CL 100 102 103 101  CO2 26 26 27 26   GLUCOSE 89 121* 131* 118*  BUN 39* 38* 38* 37*  CREATININE  1.93* 1.70* 1.55* 1.53*  CALCIUM 9.3 8.9 9.2 9.3  MG 2.2  --   --   --        Radiology Studies: No results found.      Scheduled Meds: . allopurinol  100 mg  Oral Daily  . aspirin EC  81 mg Oral Daily  . atorvastatin  20 mg Oral Daily  . benztropine  0.25 mg Oral BID  . calcium carbonate  1 tablet Oral TID WC  . carvedilol  3.125 mg Oral BID WC  . dextromethorphan-guaiFENesin  1 tablet Oral BID  . feeding supplement (ENSURE ENLIVE)  237 mL Oral BID BM  . ferrous sulfate  325 mg Oral Q breakfast  . FLUoxetine  30 mg Oral Daily  . gabapentin  300 mg Oral TID  . magnesium oxide  400 mg Oral Daily  . midodrine  2.5 mg Oral TID WC  . multivitamin with minerals  1 tablet Oral Daily  . nicotine  14 mg Transdermal Daily  . rivaroxaban  20 mg Oral Q supper  . sacubitril-valsartan  1 tablet Oral BID  . sodium chloride flush  3 mL Intravenous Once  . thiothixene  1 mg Oral BID  . torsemide  20 mg Oral BID   Continuous Infusions:   LOS: 36 days    Time spent: 25 minutes    Deatra James, MD [facility]   To contact the attending provider between 7A-7P or the covering provider during after hours 7P-7A, please log into the web site [email or web address] and access using universal  password for that web site. If you do not have the password, please call the hospital operator.  09/01/2019, 12:55 PM

## 2019-09-01 NOTE — Progress Notes (Signed)
OT Cancellation Note  Patient Details Name: Katelyn Lane MRN: [id number] DOB: [birth year]   Cancelled Treatment:    Reason Eval/Treat Not Completed: Patient declined, no reason specified. OT attempted x2 to engage pt for re-evaluation this date. Per reports from nursing and mobility techs, pt is capable of mobilizing and enjoys grooming ADLs. However this date pt has refused x2, initially this AM requesting follow up after lunch, upon PM attempt pt repeatedly stating "I will not work with therapy" "I will not do any mobility." Pt became agitated and demanded OTs and mobility techs leave her room. Will attempt to complete re-evaluation at later date/time as able and pt willing.   Dessie Coma, M.S. OTR/L  09/01/19, 1:58 PM  ascom [phone]

## 2019-09-02 LAB — BASIC METABOLIC PANEL
Anion gap: 12 (ref 5–15)
BUN: 39 mg/dL — ABNORMAL HIGH (ref 8–23)
CO2: 26 mmol/L (ref 22–32)
Calcium: 9.2 mg/dL (ref 8.9–10.3)
Chloride: 102 mmol/L (ref 98–111)
Creatinine, Ser: 1.66 mg/dL — ABNORMAL HIGH (ref 0.44–1.00)
GFR calc Af Amer: 38 mL/min — ABNORMAL LOW (ref >60)
GFR calc non Af Amer: 32 mL/min — ABNORMAL LOW (ref >60)
Glucose, Bld: 104 mg/dL — ABNORMAL HIGH (ref 70–99)
Potassium: 3.9 mmol/L (ref 3.5–5.1)
Sodium: 140 mmol/L (ref 135–145)

## 2019-09-02 LAB — SARS CORONAVIRUS 2 BY RT PCR (HOSPITAL ORDER, PERFORMED IN ~~LOC~~ HOSPITAL LAB): SARS Coronavirus 2: NEGATIVE

## 2019-09-02 MED ORDER — SPIRONOLACTONE 25 MG PO TABS: 25.0000 mg | ORAL_TABLET | Freq: Every day | ORAL | 1 refills | Status: DC

## 2019-09-02 MED ORDER — GABAPENTIN 300 MG PO CAPS: 300.0000 mg | ORAL_CAPSULE | Freq: Three times a day (TID) | ORAL | 0 refills | Status: DC

## 2019-09-02 MED ORDER — CARVEDILOL 6.25 MG PO TABS
ORAL_TABLET | ORAL | 2 refills | Status: DC
Start: 2019-09-02 — End: 2020-01-29

## 2019-09-02 MED ORDER — BENZTROPINE MESYLATE 0.5 MG PO TABS: 0.2500 mg | ORAL_TABLET | Freq: Two times a day (BID) | ORAL | 0 refills | Status: DC

## 2019-09-02 MED ORDER — FLUOXETINE HCL 10 MG PO CAPS: 30.0000 mg | ORAL_CAPSULE | Freq: Every day | ORAL | 0 refills | Status: DC

## 2019-09-02 MED ORDER — THIOTHIXENE 1 MG PO CAPS: 1.0000 mg | ORAL_CAPSULE | Freq: Two times a day (BID) | ORAL | 0 refills | Status: DC

## 2019-09-02 MED ORDER — FERROUS SULFATE 325 (65 FE) MG PO TABS: 325.0000 mg | ORAL_TABLET | Freq: Every day | ORAL | 0 refills | Status: DC

## 2019-09-02 MED ORDER — MIDODRINE HCL 2.5 MG PO TABS
2.5000 mg | ORAL_TABLET | Freq: Three times a day (TID) | ORAL | 0 refills | Status: DC | PRN
Start: 2019-09-02 — End: 2020-01-29

## 2019-09-02 NOTE — TOC Transition Note (Signed)
Transition of Care [facility]) - CM/SW Discharge Note   Patient Details  Name: Katelyn Lane MRN: [id number] Date of Birth: 1955/03/22  [facility] of [facility] CM/SW Contact:  Beverly Sessions, RN Phone Number: 09/02/2019, 12:13 PM   Clinical Narrative:     [facility] is [facility] to offer a bed Discussed with patient's sister [name] and she has accepted the bed  Patient to discharge today RNCM has arrange transport through [facility].  [facility] will pick up patient at 1:30.  MD and RN notified  Bedside RN to call report  Voicemail left for [name] [phone] with dss providing detailed update     Final next level of care: Skilled Nursing Facility Barriers to Discharge: No Barriers Identified   Patient Goals and CMS Choice        Discharge Placement                Patient to be transferred to facility by: [place] Name of family member notified: sister Patient and family notified of of transfer: 09/02/19  Discharge Plan and Services                                     Social Determinants of Health (SDOH) Interventions     Readmission Risk Interventions Readmission Risk Prevention Plan 08/14/2019 07/27/2019 03/27/2019  Transportation Screening Complete Complete Complete  Medication Review [employer]) Referral to Pharmacy - (No Data)  PCP or Specialist appointment within 3-5 days of discharge Complete Complete Complete  HRI or Home Care Consult Complete Complete Complete  SW Recovery Care/Counseling Consult Complete Complete Complete  Palliative Care Screening Not Applicable Not Applicable Not [place] Not Applicable Not Applicable Not Applicable  Some recent data might be hidden

## 2019-09-02 NOTE — Discharge Summary (Addendum)
Physician Discharge Summary  Katelyn Lane KTG:[id number] DOB: [birth year] DOA: 07/26/2019  PCP: Katelyn Burrow, MD  Admit date: 07/26/2019 Discharge date: 09/02/2019  Admitted From: home  Disposition: SNF  Recommendations for Outpatient Follow-up:  1. Follow up with PCP in 1-2 weeks 2. F/u cardio, Dr. Clayborn Lane, in 1 week   Home Health: no Equipment/Devices:  Discharge Condition: stable  CODE STATUS:full  Diet recommendation: Heart Healthy  Brief/Interim Summary: HPI was taken from Dr. Sidney Lane: Katelyn Lane  is a 64 y.o. African-American female with a known history of CHF, COVID-19, dyslipidemia, hypertension and stage IIIb chronic kidney disease, who presented to the emergency room with acute onset of worsening dyspnea with associated dry cough and wheezing as well as orthopnea and paroxysmal nocturnal dyspnea and lower extremity edema over the last week.  No chest pain or palpitations.  She admitted to missing doses of her Bumex sometimes, about 3 days a week.  She last smoked cocaine about 3 to 4 days ago.  She denied any fever or chills.  No nausea or vomiting or abdominal pain.  She was fairly somnolent but arousable arousable.  No dysuria, oliguria or hematuria or flank pain.  Upon presentation to the emergency room, vital signs were within normal initially.  Labs revealed BUN of 52 and creatinine 2.39 compared to 39 and 1.82 on 06/24/2019.  CBC showed anemia close to baseline.  BNP was more than 4500.  Portable chest x-ray showed cardiomegaly and interstitial pulmonary edema.  EKG showed normal sinus rhythm with rate of 70 with PACs, left axis deviation, low voltage QRS and Q waves anterolaterally and inferiorly.  The patient was given 40 mg of IV Lasix.  She will be admitted to a progressive unit bed for further evaluation and management.    Hospital Course from Dr. Roger Lane: 64 year old female with known history of chronic systolic heart failure, recent COVID-19  infection, hyperlipidemia and hypertension, stage IIIb chronic kidney disease presented to the emergency room 07/27/2019 with acute onset of worsening dyspnea associated with dry cough wheezing, orthopnea and PND as well lower extremity edema for 1 week. Patient had previous admissions with noncompliance. She smokes cocaine and she is unfortunately homeless. Admitted for acute on chronic systolic and diastolic CHF and AKI on sage IIIb CKD. The patient had a 2D echo on 06/22/2019 that revealed an EF of 25 to 30% with grade 3 diastolic dysfunction, severe left atrial and right atrial dilatation as well as mild mitral regurgitation. Chest x-ray showed cardiomegaly and interstitial pulmonary edema. Patient is 26days in the hospital today due to disposition problems.  8/14. Patient still has significant anasarca with the severe leg edema. Increase torsemide to 40 mg daily. Monitor renal function. Also obtain right upper quadrant ultrasound to rule out liver cirrhosis.  8/15.Discussed with social worker, patient has been refused all to help from the hospital. We will evaluate her decision making capacity. 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  8/16.Leg edema is better, renal function improving after giving 40 mg of torsemide yesterday, continue torsemide at 20 mg twice a day.  8/17.  Patient is deemed to be not competent to make her own decisions by psychiatry.  APS is involved.  Social worker is exploring option for discharge.    8/18/patient apparently deemed incompetent unable to make decision by her son by psych.  APS still involved social worker still working on discharge -continue to diurese on IV Lasix, tapering down due to increasing creatinine   08/27/2019-patient was  seen still complaining of shortness of breath -especially with exertion  08/28/2019 -patient reporting improved shortness of breath, actually was weaned off supplemental oxygen--currently  satting 98% Patient shortness of breath tend to get worse in afternoons  08/29/2019 -stable on room air, continues to complain of lower extremity pain, edema has improved  08/30/2019 -remained stable on room air, no issues overnight  08/31/2019 -remained stable on room air, p.o. medication, creatinine improving No issues overnight  09/01/2019 -patient remained stable on room air no issues overnight Discussed with social worker, she has reached out over 60 SNF across the [place] no offers yet.  From Dr. Lenise Lane 09/02/19: CM found a bed at a SNF. Pt was d/c to SNF.   Discharge Diagnoses:  Active Problems:   Hyperlipidemia   Depression   CKD (chronic kidney disease), stage III   Acute metabolic encephalopathy   History of substance abuse (HCC)   Chronic obstructive pulmonary disease (HCC)   Pulmonary hypertension (HCC)   Acute on chronic combined systolic (congestive) and diastolic (congestive) heart failure (HCC)   History of anemia due to chronic kidney disease   Prolonged QT interval   Acute CHF (congestive heart failure) (HCC)  Acute on chronic hypoxic respiratory failure: etiology is multifactorial including acute on chronic systolic/diastolic CHF, pulmonary hypertension. Resolved  Acute on chronic combined CHF exacerbation: w/ moderate pulmon HTN. Continue on bumex at d/c. Monitor I/Os   AKI on CKDIIIb: Cr is at baseline. Will continue to monitor   ACD: s/p multiple dose of IV iron. Continue on po iron supplments   Metabolic encephalopathy: mental status is labile. Will continue to monitor   History of ventricular thrombus: continue xarelto   Lacking of decision making capacity: APS is involved & to determine guardianship. Psych confirmed the pt lacks decision making capacity for herself   Discharge Instructions  Discharge Instructions    Diet - low sodium heart healthy   Complete by: As directed    Discharge instructions   Complete by: As directed    F/u  PCP in 1 week. F/u cardio, Dr. Clayborn Lane, in 1 week   Increase activity slowly   Complete by: As directed      Allergies as of 09/02/2019      Reactions   Penicillins Hives, Rash, Other (See Comments)   Did it involve swelling of the face/tongue/throat, SOB, or low BP? No Did it involve sudden or severe rash/hives, skin peeling, or any reaction on the inside of your mouth or nose? Yes Did you need to seek medical attention at a hospital or doctor's office? Yes When did it last happen? >25 years If all above answers are "NO", may proceed with cephalosporin use.   Lane Inhibitors Nausea And Vomiting      Medication List    TAKE these medications   albuterol 108 (90 Base) MCG/ACT inhaler Commonly known as: VENTOLIN HFA Inhale 2 puffs into the lungs every 6 (six) hours as needed for wheezing or shortness of breath.   allopurinol 100 MG tablet Commonly known as: Zyloprim Take 1 tablet (100 mg total) by mouth daily.   ascorbic acid 500 MG tablet Commonly known as: VITAMIN C Take 500 mg by mouth daily.   aspirin 81 MG chewable tablet Chew 1 tablet (81 mg total) by mouth daily.   atorvastatin 20 MG tablet Commonly known as: LIPITOR Take 1 tablet (20 mg total) by mouth daily at 6 PM.   benztropine 0.5 MG tablet Commonly known as: COGENTIN Take  0.5 tablets (0.25 mg total) by mouth 2 (two) times daily.   bumetanide 1 MG tablet Commonly known as: BUMEX Take 1 tablet (1 mg total) by mouth 2 (two) times daily. May take additional dose for worsening leg swellings/shortness of breath   carvedilol 6.25 MG tablet Commonly known as: COREG Hold this medication until you see your cardiologist What changed:   how much to take  how to take this  when to take this  additional instructions   docusate calcium 240 MG capsule Commonly known as: SURFAK Take 240 mg by mouth daily as needed for moderate constipation.   Entresto 49-51 MG Generic drug: sacubitril-valsartan Take 1  tablet by mouth 2 (two) times daily.   ferrous sulfate 325 (65 FE) MG tablet Take 1 tablet (325 mg total) by mouth daily with breakfast. Start taking on: September 03, 2019   FLUoxetine 10 MG capsule Commonly known as: PROZAC Take 3 capsules (30 mg total) by mouth daily. Start taking on: September 03, 2019 What changed:   medication strength  how much to take   gabapentin 300 MG capsule Commonly known as: NEURONTIN Take 1 capsule (300 mg total) by mouth 3 (three) times daily.   Ipratropium-Albuterol 20-100 MCG/ACT Aers respimat Commonly known as: COMBIVENT Inhale 1 puff into the lungs every 6 (six) hours.   loratadine 10 MG tablet Commonly known as: CLARITIN Take 10 mg by mouth daily as needed for allergies.   midodrine 2.5 MG tablet Commonly known as: PROAMATINE Take 1 tablet (2.5 mg total) by mouth 3 (three) times daily with meals as needed (for MAP <65 and/or SBP <100).   nitroGLYCERIN 0.4 MG SL tablet Commonly known as: NITROSTAT Place 1 tablet (0.4 mg total) under the tongue every 5 (five) minutes as needed for chest pain.   pantoprazole 40 MG tablet Commonly known as: Protonix Take 1 tablet (40 mg total) by mouth daily.   rivaroxaban 20 MG Tabs tablet Commonly known as: XARELTO Take 1 tablet (20 mg total) by mouth daily with supper. For the blood clot in your heart.   spironolactone 25 MG tablet Commonly known as: ALDACTONE Take 1 tablet (25 mg total) by mouth daily. Hold this medication until you see your cardiologist What changed: additional instructions   thiothixene 1 MG capsule Commonly known as: NAVANE Take 1 capsule (1 mg total) by mouth 2 (two) times daily.       Contact information for follow-up providers    [facility] up on 09/11/2019.   Specialty: Cardiology Why: at 9:00am. Enter through the [facility] entrance Contact information: [place]  [place] [phone]           Contact information for after-discharge care    [facility] .   Service: Skilled Nursing Contact information: [place] [phone]                 Allergies  Allergen Reactions  . Penicillins Hives, Rash and Other (See Comments)    Did it involve swelling of the face/tongue/throat, SOB, or low BP? No Did it involve sudden or severe rash/hives, skin peeling, or any reaction on the inside of your mouth or nose? Yes Did you need to seek medical attention at a hospital or doctor's office? Yes When did it last happen? >25 years If all above answers are "NO", may proceed with cephalosporin  use.   . Lane Inhibitors Nausea And Vomiting    Consultations:     Procedures/Studies: DG Chest 2 View  Result Date: 08/27/2019 CLINICAL DATA:  Shortness of breath. Additional history provided: Chronic systolic heart failure, recent COVID-19 infection, hyperlipidemia, hypertension, chronic kidney disease, worsening dyspnea. EXAM: CHEST - 2 VIEW COMPARISON:  CT angiogram chest 08/15/2019. Chest radiograph 07/26/2019 FINDINGS: Cardiomegaly with central pulmonary vascular congestion. Persistent vague left basilar opacity. The right lung is clear. No evidence of pneumothorax. No acute bony abnormality is identified. IMPRESSION: Cardiomegaly with central pulmonary vascular congestion. Persistent vague left basilar opacity which may reflect atelectasis or pneumonia. Electronically Signed   By: Kellie Simmering DO   On: 08/27/2019 18:44   CT HEAD WO CONTRAST  Result Date: 08/15/2019 CLINICAL DATA:  Delirium EXAM: CT HEAD WITHOUT CONTRAST TECHNIQUE: Contiguous axial images were obtained from the base of the skull through the vertex without intravenous contrast. COMPARISON:  May 23, 2019 FINDINGS: Brain: No evidence of acute territorial infarction, hemorrhage, hydrocephalus,extra-axial  collection or mass lesion/mass effect. There is dilatation the ventricles and sulci consistent with age-related atrophy. Low-attenuation changes in the deep white matter consistent with small vessel ischemia. Vascular: No hyperdense vessel or unexpected calcification. Skull: The skull is intact. No fracture or focal lesion identified. Sinuses/Orbits: The visualized paranasal sinuses and mastoid air cells are clear. The orbits and globes intact. Other: None IMPRESSION: No acute intracranial abnormality. Findings consistent with age related atrophy and chronic small vessel ischemia Electronically Signed   By: Prudencio Pair M.D.   On: 08/15/2019 19:06   CT ANGIO CHEST PE W OR WO CONTRAST  Result Date: 08/15/2019 CLINICAL DATA:  Chest pain and shortness of breath. EXAM: CT ANGIOGRAPHY CHEST WITH CONTRAST TECHNIQUE: Multidetector CT imaging of the chest was performed using the standard protocol during bolus administration of intravenous contrast. Multiplanar CT image reconstructions and MIPs were obtained to evaluate the vascular anatomy. CONTRAST:  74mL OMNIPAQUE IOHEXOL 350 MG/ML SOLN COMPARISON:  August 07, 2009 FINDINGS: Cardiovascular: There is mild calcification of the aortic arch. The subsegmental and left lower lobe branches of the pulmonary arteries are limited in evaluation secondary to suboptimal opacification with intravenous contrast. No evidence of pulmonary embolism. There is marked severity cardiomegaly. No pericardial effusion. Mediastinum/Nodes: No enlarged mediastinal, hilar, or axillary lymph nodes. Thyroid gland, trachea, and esophagus demonstrate no significant findings. Lungs/Pleura: Moderate severity left lower lobe consolidation is seen. There is a small left pleural effusion. A trace amount of pleural fluid is also seen on the right. No pneumothorax is identified. Upper Abdomen: No acute abnormality. Musculoskeletal: Multilevel degenerative changes seen throughout the thoracic spine. Review of  the MIP images confirms the above findings. IMPRESSION: 1. Moderate severity left lower lobe consolidation which may represent atelectasis and/or pneumonia. 2. Small left pleural effusion. 3. Marked severity cardiomegaly. 4. Aortic atherosclerosis. Aortic Atherosclerosis (ICD10-I70.0). Electronically Signed   By: Virgina Norfolk M.D.   On: 08/15/2019 19:12   CT ABDOMEN PELVIS W CONTRAST  Result Date: 08/15/2019 CLINICAL DATA:  Chest pain and abdominal pain. EXAM: CT ABDOMEN AND PELVIS WITH CONTRAST TECHNIQUE: Multidetector CT imaging of the abdomen and pelvis was performed using the standard protocol following bolus administration of intravenous contrast. CONTRAST:  44mL OMNIPAQUE IOHEXOL 350 MG/ML SOLN COMPARISON:  None. FINDINGS: Lower chest: Mild atelectasis and/or infiltrate is seen within the visualized portion of the left lung base. There is a small left pleural effusion. There is marked severity cardiomegaly. Hepatobiliary: No focal liver abnormality is seen.  No gallstones, gallbladder wall thickening, or biliary dilatation. Pancreas: Unremarkable. No pancreatic ductal dilatation or surrounding inflammatory changes. Spleen: Normal in size without focal abnormality. Adrenals/Urinary Tract: Adrenal glands are unremarkable. Kidneys are normal in size, without renal calculi or hydronephrosis. Bilateral subcentimeter ill-defined renal cysts are noted. Bladder is unremarkable. Stomach/Bowel: Stomach is within normal limits. Appendix appears normal. No evidence of bowel dilatation. Noninflamed diverticula are seen within the descending and sigmoid colon. Vascular/Lymphatic: There is mild calcification of the abdominal aorta and bilateral common iliac arteries, without evidence of aneurysmal dilatation or dissection. No enlarged abdominal or pelvic lymph nodes. Reproductive: Status post hysterectomy. No adnexal masses. Other: Marked severity soft tissue edema is seen along the lateral aspects of the abdominal  and pelvic walls, bilaterally. There is a mild amount of abdominal and pelvic free fluid. Musculoskeletal: No acute or significant osseous findings. IMPRESSION: 1. Mild left basilar atelectasis and/or infiltrate with a small left pleural effusion. 2. Marked severity cardiomegaly. 3. Marked severity soft tissue edema along the lateral aspects of the abdominal and pelvic walls, bilaterally. 4. Mild amount of abdominal and pelvic free fluid. 5. Noninflamed diverticula of the descending and sigmoid colon. 6. Aortic atherosclerosis. Aortic Atherosclerosis (ICD10-I70.0). Electronically Signed   By: Virgina Norfolk M.D.   On: 08/15/2019 19:19   US Abdomen Limited RUQ  Result Date: 08/23/2019 CLINICAL DATA:  Possible cirrhosis EXAM: ULTRASOUND ABDOMEN LIMITED RIGHT UPPER QUADRANT COMPARISON:  08/15/2010 FINDINGS: Gallbladder: Gallbladder is well distended. Mild wall thickening is noted likely accentuated by the ascites present. No cholelithiasis is noted. Negative sonographic Murphy's sign is elicited. Common bile duct: Diameter: 2.5 mm. Liver: Mild nodularity is noted. Increased echogenicity is seen. These changes are likely related to underlying cirrhosis. No focal mass is noted. Portal vein is patent on color Doppler imaging with normal direction of blood flow towards the liver. Other: Mild ascites is noted. IMPRESSION: Mild wall thickening likely related to the underlying mild ascites. Changes consistent with mild cirrhosis of the liver. Electronically Signed   By: Inez Catalina M.D.   On: 08/23/2019 09:27      Subjective: Pt c/o fatigue   Discharge Exam: Vitals:   09/01/19 2030 09/02/19 0516  BP: 98/72 113/84  Pulse: 65 68  Resp:  20  Temp:  (!) 97.4 F (36.3 C)  SpO2:  95%   Vitals:   09/01/19 1826 09/01/19 2008 09/01/19 2030 09/02/19 0516  BP: 107/64 (!) 87/61 98/72 113/84  Pulse: 70 60 65 68  Resp:  20  20  Temp:  97.9 F (36.6 C)  (!) 97.4 F (36.3 C)  TempSrc:  Oral  Oral  SpO2:   100%  95%  Weight:      Height:        General: Pt is alert, awake, not in acute distress Cardiovascular:  S1/S2 +, no rubs, no gallops Respiratory: diminished breath sounds b/l. No wheezes Abdominal: Soft, NT, obese, bowel sounds + Extremities:  B/l LE edema. no cyanosis    The results of significant diagnostics from this hospitalization (including imaging, microbiology, ancillary and laboratory) are listed below for reference.     Microbiology: Recent Results (from the past 240 hour(s))  SARS Coronavirus 2 by RT PCR (hospital order, performed in [facility] hospital lab) Nasopharyngeal Nasopharyngeal Swab     Status: None   Collection Time: 09/02/19 10:17 AM   Specimen: Nasopharyngeal Swab  Result Value Ref Range Status   SARS Coronavirus 2 NEGATIVE NEGATIVE Final    Comment: (NOTE)  SARS-CoV-2 target nucleic acids are NOT DETECTED.  The SARS-CoV-2 RNA is generally detectable in upper and lower respiratory specimens during the acute phase of infection. The lowest concentration of SARS-CoV-2 viral copies this assay can detect is 250 copies / mL. A negative result does not preclude SARS-CoV-2 infection and should not be used as the sole basis for treatment or other patient management decisions.  A negative result may occur with improper specimen collection / handling, submission of specimen other than nasopharyngeal swab, presence of viral mutation(s) within the areas targeted by this assay, and inadequate number of viral copies (<250 copies / mL). A negative result must be combined with clinical observations, patient history, and epidemiological information.  Fact Sheet for Patients:   StrictlyIdeas.no  Fact Sheet for Healthcare Providers: BankingDealers.co.za  This test is not yet approved or  cleared by the [place] FDA and has been authorized for detection and/or diagnosis of SARS-CoV-2 by FDA under an Emergency Use  Authorization (EUA).  This EUA will remain in effect (meaning this test can be used) for the duration of the COVID-19 declaration under Section 564(b)(1) of the Act, 21 U.S.C. section 360bbb-3(b)(1), unless the authorization is terminated or revoked sooner.  Performed at [facility], [address] [place], [place] [address]      Labs: BNP (last 3 results) Recent Labs    08/10/19 0553 08/27/19 1551 08/28/19 0940  BNP 1,746.5* 3,266.2* 3,532.9*   Basic Metabolic Panel: Recent Labs  Lab 08/28/19 0945 08/31/19 0542 09/01/19 0524 09/02/19 0558  NA 138 141 139 140  K 3.5 3.5 3.9 3.9  CL 102 103 101 102  CO2 26 27 26 26   GLUCOSE 121* 131* 118* 104*  BUN 38* 38* 37* 39*  CREATININE 1.70* 1.55* 1.53* 1.66*  CALCIUM 8.9 9.2 9.3 9.2   Liver Function Tests: Recent Labs  Lab 08/28/19 0945  AST 21  ALT 12  ALKPHOS 215*  BILITOT 1.4*  PROT 6.6  ALBUMIN 2.8*   No results for input(s): LIPASE, AMYLASE in the last 168 hours. No results for input(s): AMMONIA in the last 168 hours. CBC: Recent Labs  Lab 08/30/19 1127  WBC 5.6  HGB 9.8*  HCT 31.8*  MCV 79.1*  PLT 307   Cardiac Enzymes: No results for input(s): CKTOTAL, CKMB, CKMBINDEX, TROPONINI in the last 168 hours. BNP: Invalid input(s): POCBNP CBG: No results for input(s): GLUCAP in the last 168 hours. D-Dimer No results for input(s): DDIMER in the last 72 hours. Hgb A1c No results for input(s): HGBA1C in the last 72 hours. Lipid Profile No results for input(s): CHOL, HDL, LDLCALC, TRIG, CHOLHDL, LDLDIRECT in the last 72 hours. Thyroid function studies No results for input(s): TSH, T4TOTAL, T3FREE, THYROIDAB in the last 72 hours.  Invalid input(s): FREET3 Anemia work up No results for input(s): VITAMINB12, FOLATE, FERRITIN, TIBC, IRON, RETICCTPCT in the last 72 hours. Urinalysis    Component Value Date/Time   COLORURINE YELLOW (A) 07/27/2019 0836   APPEARANCEUR CLEAR (A) 07/27/2019 0836    LABSPEC 1.009 07/27/2019 0836   PHURINE 6.0 07/27/2019 0836   GLUCOSEU NEGATIVE 07/27/2019 0836   HGBUR NEGATIVE 07/27/2019 0836   BILIRUBINUR NEGATIVE 07/27/2019 0836   KETONESUR NEGATIVE 07/27/2019 0836   PROTEINUR NEGATIVE 07/27/2019 0836   NITRITE NEGATIVE 07/27/2019 0836   LEUKOCYTESUR NEGATIVE 07/27/2019 0836   Sepsis Labs Invalid input(s): PROCALCITONIN,  WBC,  LACTICIDVEN Microbiology Recent Results (from the past 240 hour(s))  SARS Coronavirus 2 by RT PCR (hospital order, performed  in [facility] lab) Nasopharyngeal Nasopharyngeal Swab     Status: None   Collection Time: 09/02/19 10:17 AM   Specimen: Nasopharyngeal Swab  Result Value Ref Range Status   SARS Coronavirus 2 NEGATIVE NEGATIVE Final    Comment: (NOTE) SARS-CoV-2 target nucleic acids are NOT DETECTED.  The SARS-CoV-2 RNA is generally detectable in upper and lower respiratory specimens during the acute phase of infection. The lowest concentration of SARS-CoV-2 viral copies this assay can detect is 250 copies / mL. A negative result does not preclude SARS-CoV-2 infection and should not be used as the sole basis for treatment or other patient management decisions.  A negative result may occur with improper specimen collection / handling, submission of specimen other than nasopharyngeal swab, presence of viral mutation(s) within the areas targeted by this assay, and inadequate number of viral copies (<250 copies / mL). A negative result must be combined with clinical observations, patient history, and epidemiological information.  Fact Sheet for Patients:   StrictlyIdeas.no  Fact Sheet for Healthcare Providers: BankingDealers.co.za  This test is not yet approved or  cleared by the [place] FDA and has been authorized for detection and/or diagnosis of SARS-CoV-2 by FDA under an Emergency Use Authorization (EUA).  This EUA will remain in effect  (meaning this test can be used) for the duration of the COVID-19 declaration under Section 564(b)(1) of the Act, 21 U.S.C. section 360bbb-3(b)(1), unless the authorization is terminated or revoked sooner.  Performed at [facility], [address] [place], [place] [address]      Time coordinating discharge: Over 30 minutes  SIGNED:   [name], MD  [facility] 09/02/2019, 12:13 PM Pager   If 7PM-7AM, please contact night-coverage [email or web address]

## 2019-09-02 NOTE — Plan of Care (Signed)
The patient has been trasnfer via [facility] to [facility]. No falls. The patient has been stable upon discharge. Report given to [facility]. Problem: Education: Goal: Knowledge of General Education information will improve Description: Including pain rating scale, medication(s)/side effects and non-pharmacologic comfort measures Outcome: Completed/Met   Problem: Health Behavior/Discharge Planning: Goal: Ability to manage health-related needs will improve Outcome: Completed/Met   Problem: Clinical Measurements: Goal: Ability to maintain clinical measurements within normal limits will improve Outcome: Completed/Met Goal: Will remain free from infection Outcome: Completed/Met Goal: Diagnostic test results will improve Outcome: Completed/Met Goal: Respiratory complications will improve Outcome: Completed/Met Goal: Cardiovascular complication will be avoided Outcome: Completed/Met   Problem: Nutrition: Goal: Adequate nutrition will be maintained Outcome: Completed/Met   Problem: Coping: Goal: Level of anxiety will decrease Outcome: Completed/Met   Problem: Elimination: Goal: Will not experience complications related to bowel motility Outcome: Completed/Met Goal: Will not experience complications related to urinary retention Outcome: Completed/Met   Problem: Pain Managment: Goal: General experience of comfort will improve Outcome: Completed/Met   Problem: Safety: Goal: Ability to remain free from injury will improve Outcome: Completed/Met   Problem: Skin Integrity: Goal: Risk for impaired skin integrity will decrease Outcome: Completed/Met   Problem: Inadequate Intake (NI-2.1) Goal: Food and/or nutrient delivery Description: Individualized approach for food/nutrient provision. Outcome: Completed/Met   Problem: Acute Rehab OT Goals (only OT should resolve) Goal: Pt. Will Perform Grooming Outcome: Completed/Met Goal: Pt. Will Perform Lower Body  Dressing Outcome: Completed/Met Goal: Pt. Will Transfer To Toilet Outcome: Completed/Met Goal: Pt. Will Perform Toileting-Clothing Manipulation Outcome: Completed/Met   Problem: Acute Rehab PT Goals(only PT should resolve) Goal: Pt Will Go Sit To Supine/Side Outcome: Completed/Met Goal: Pt Will Transfer Bed To Chair/Chair To Bed Outcome: Completed/Met Goal: Pt Will Ambulate Outcome: Completed/Met Goal: Pt Will Go Up/Down Stairs Outcome: Completed/Met Goal: Pt/caregiver will Perform Home Exercise Program Outcome: Completed/Met

## 2019-09-11 ENCOUNTER — Ambulatory Visit: Payer: Medicaid Other | Admitting: Family

## 2019-09-11 ENCOUNTER — Telehealth: Payer: Self-pay | Admitting: Family

## 2019-09-11 NOTE — Telephone Encounter (Signed)
Patient did not show for her [facility] appointment on 09/11/19. Will attempt to reschedule.

## 2019-10-19 ENCOUNTER — Emergency Department: Payer: Medicaid Other

## 2019-10-19 ENCOUNTER — Other Ambulatory Visit: Payer: Self-pay

## 2019-10-19 ENCOUNTER — Inpatient Hospital Stay
Admission: EM | Admit: 2019-10-19 | Discharge: 2020-01-29 | DRG: 291 | Disposition: A | Discharge status: Home / Self Care | Payer: Medicaid Other | Attending: Internal Medicine | Admitting: Internal Medicine

## 2019-10-19 DIAGNOSIS — F0391 Unspecified dementia with behavioral disturbance: Secondary | ICD-10-CM | POA: Diagnosis present

## 2019-10-19 DIAGNOSIS — J0100 Acute maxillary sinusitis, unspecified: Secondary | ICD-10-CM | POA: Diagnosis present

## 2019-10-19 DIAGNOSIS — T471X6A Underdosing of other antacids and anti-gastric-secretion drugs, initial encounter: Secondary | ICD-10-CM | POA: Diagnosis present

## 2019-10-19 DIAGNOSIS — I509 Heart failure, unspecified: Secondary | ICD-10-CM

## 2019-10-19 DIAGNOSIS — Z20822 Contact with and (suspected) exposure to covid-19: Secondary | ICD-10-CM | POA: Diagnosis present

## 2019-10-19 DIAGNOSIS — Z66 Do not resuscitate: Secondary | ICD-10-CM | POA: Diagnosis not present

## 2019-10-19 DIAGNOSIS — K7031 Alcoholic cirrhosis of liver with ascites: Secondary | ICD-10-CM | POA: Insufficient documentation

## 2019-10-19 DIAGNOSIS — I5031 Acute diastolic (congestive) heart failure: Secondary | ICD-10-CM | POA: Diagnosis present

## 2019-10-19 DIAGNOSIS — I472 Ventricular tachycardia: Secondary | ICD-10-CM | POA: Diagnosis not present

## 2019-10-19 DIAGNOSIS — T45516A Underdosing of anticoagulants, initial encounter: Secondary | ICD-10-CM | POA: Diagnosis present

## 2019-10-19 DIAGNOSIS — Z7189 Other specified counseling: Secondary | ICD-10-CM

## 2019-10-19 DIAGNOSIS — N170 Acute kidney failure with tubular necrosis: Secondary | ICD-10-CM | POA: Diagnosis present

## 2019-10-19 DIAGNOSIS — R109 Unspecified abdominal pain: Secondary | ICD-10-CM | POA: Diagnosis not present

## 2019-10-19 DIAGNOSIS — Z5900 Homelessness unspecified: Secondary | ICD-10-CM | POA: Diagnosis not present

## 2019-10-19 DIAGNOSIS — B351 Tinea unguium: Secondary | ICD-10-CM | POA: Diagnosis present

## 2019-10-19 DIAGNOSIS — R809 Proteinuria, unspecified: Secondary | ICD-10-CM | POA: Diagnosis present

## 2019-10-19 DIAGNOSIS — R1310 Dysphagia, unspecified: Secondary | ICD-10-CM | POA: Diagnosis present

## 2019-10-19 DIAGNOSIS — D75839 Thrombocytosis, unspecified: Secondary | ICD-10-CM | POA: Diagnosis present

## 2019-10-19 DIAGNOSIS — T43226A Underdosing of selective serotonin reuptake inhibitors, initial encounter: Secondary | ICD-10-CM | POA: Diagnosis present

## 2019-10-19 DIAGNOSIS — I132 Hypertensive heart and chronic kidney disease with heart failure and with stage 5 chronic kidney disease, or end stage renal disease: Secondary | ICD-10-CM | POA: Diagnosis present

## 2019-10-19 DIAGNOSIS — I493 Ventricular premature depolarization: Secondary | ICD-10-CM | POA: Diagnosis not present

## 2019-10-19 DIAGNOSIS — M109 Gout, unspecified: Secondary | ICD-10-CM | POA: Diagnosis present

## 2019-10-19 DIAGNOSIS — R609 Edema, unspecified: Secondary | ICD-10-CM | POA: Diagnosis not present

## 2019-10-19 DIAGNOSIS — N189 Chronic kidney disease, unspecified: Secondary | ICD-10-CM | POA: Diagnosis not present

## 2019-10-19 DIAGNOSIS — N1832 Chronic kidney disease, stage 3b: Secondary | ICD-10-CM | POA: Diagnosis not present

## 2019-10-19 DIAGNOSIS — N186 End stage renal disease: Secondary | ICD-10-CM | POA: Diagnosis present

## 2019-10-19 DIAGNOSIS — Z86718 Personal history of other venous thrombosis and embolism: Secondary | ICD-10-CM

## 2019-10-19 DIAGNOSIS — R34 Anuria and oliguria: Secondary | ICD-10-CM | POA: Diagnosis not present

## 2019-10-19 DIAGNOSIS — Z79899 Other long term (current) drug therapy: Secondary | ICD-10-CM

## 2019-10-19 DIAGNOSIS — Z8673 Personal history of transient ischemic attack (TIA), and cerebral infarction without residual deficits: Secondary | ICD-10-CM | POA: Diagnosis not present

## 2019-10-19 DIAGNOSIS — J449 Chronic obstructive pulmonary disease, unspecified: Secondary | ICD-10-CM | POA: Diagnosis present

## 2019-10-19 DIAGNOSIS — T500X6A Underdosing of mineralocorticoids and their antagonists, initial encounter: Secondary | ICD-10-CM | POA: Diagnosis present

## 2019-10-19 DIAGNOSIS — R140 Abdominal distension (gaseous): Secondary | ICD-10-CM

## 2019-10-19 DIAGNOSIS — I953 Hypotension of hemodialysis: Secondary | ICD-10-CM | POA: Diagnosis not present

## 2019-10-19 DIAGNOSIS — Z7982 Long term (current) use of aspirin: Secondary | ICD-10-CM

## 2019-10-19 DIAGNOSIS — Z992 Dependence on renal dialysis: Secondary | ICD-10-CM | POA: Diagnosis not present

## 2019-10-19 DIAGNOSIS — K652 Spontaneous bacterial peritonitis: Secondary | ICD-10-CM | POA: Diagnosis not present

## 2019-10-19 DIAGNOSIS — F0390 Unspecified dementia without behavioral disturbance: Secondary | ICD-10-CM

## 2019-10-19 DIAGNOSIS — J9601 Acute respiratory failure with hypoxia: Secondary | ICD-10-CM | POA: Diagnosis present

## 2019-10-19 DIAGNOSIS — R188 Other ascites: Secondary | ICD-10-CM

## 2019-10-19 DIAGNOSIS — E785 Hyperlipidemia, unspecified: Secondary | ICD-10-CM | POA: Diagnosis present

## 2019-10-19 DIAGNOSIS — K767 Hepatorenal syndrome: Secondary | ICD-10-CM | POA: Diagnosis present

## 2019-10-19 DIAGNOSIS — I1310 Hypertensive heart and chronic kidney disease without heart failure, with stage 1 through stage 4 chronic kidney disease, or unspecified chronic kidney disease: Secondary | ICD-10-CM

## 2019-10-19 DIAGNOSIS — K5909 Other constipation: Secondary | ICD-10-CM | POA: Diagnosis present

## 2019-10-19 DIAGNOSIS — Z6841 Body Mass Index (BMI) 40.0 and over, adult: Secondary | ICD-10-CM

## 2019-10-19 DIAGNOSIS — T444X6A Underdosing of predominantly alpha-adrenoreceptor agonists, initial encounter: Secondary | ICD-10-CM | POA: Diagnosis present

## 2019-10-19 DIAGNOSIS — Z5320 Procedure and treatment not carried out because of patient's decision for unspecified reasons: Secondary | ICD-10-CM | POA: Diagnosis not present

## 2019-10-19 DIAGNOSIS — F1410 Cocaine abuse, uncomplicated: Secondary | ICD-10-CM | POA: Diagnosis not present

## 2019-10-19 DIAGNOSIS — Z515 Encounter for palliative care: Secondary | ICD-10-CM

## 2019-10-19 DIAGNOSIS — I513 Intracardiac thrombosis, not elsewhere classified: Secondary | ICD-10-CM | POA: Diagnosis not present

## 2019-10-19 DIAGNOSIS — I9589 Other hypotension: Secondary | ICD-10-CM

## 2019-10-19 DIAGNOSIS — F1027 Alcohol dependence with alcohol-induced persisting dementia: Secondary | ICD-10-CM | POA: Diagnosis not present

## 2019-10-19 DIAGNOSIS — I491 Atrial premature depolarization: Secondary | ICD-10-CM | POA: Diagnosis not present

## 2019-10-19 DIAGNOSIS — Y828 Other medical devices associated with adverse incidents: Secondary | ICD-10-CM | POA: Diagnosis not present

## 2019-10-19 DIAGNOSIS — I471 Supraventricular tachycardia: Secondary | ICD-10-CM | POA: Diagnosis not present

## 2019-10-19 DIAGNOSIS — T445X6A Underdosing of predominantly beta-adrenoreceptor agonists, initial encounter: Secondary | ICD-10-CM | POA: Diagnosis present

## 2019-10-19 DIAGNOSIS — R791 Abnormal coagulation profile: Secondary | ICD-10-CM | POA: Diagnosis not present

## 2019-10-19 DIAGNOSIS — Z880 Allergy status to penicillin: Secondary | ICD-10-CM

## 2019-10-19 DIAGNOSIS — Z888 Allergy status to other drugs, medicaments and biological substances status: Secondary | ICD-10-CM

## 2019-10-19 DIAGNOSIS — Z029 Encounter for administrative examinations, unspecified: Secondary | ICD-10-CM

## 2019-10-19 DIAGNOSIS — F32A Depression, unspecified: Secondary | ICD-10-CM | POA: Diagnosis present

## 2019-10-19 DIAGNOSIS — J690 Pneumonitis due to inhalation of food and vomit: Secondary | ICD-10-CM

## 2019-10-19 DIAGNOSIS — R197 Diarrhea, unspecified: Secondary | ICD-10-CM | POA: Diagnosis not present

## 2019-10-19 DIAGNOSIS — F17200 Nicotine dependence, unspecified, uncomplicated: Secondary | ICD-10-CM | POA: Diagnosis present

## 2019-10-19 DIAGNOSIS — R1110 Vomiting, unspecified: Secondary | ICD-10-CM

## 2019-10-19 DIAGNOSIS — R0602 Shortness of breath: Secondary | ICD-10-CM

## 2019-10-19 DIAGNOSIS — I5043 Acute on chronic combined systolic (congestive) and diastolic (congestive) heart failure: Secondary | ICD-10-CM | POA: Diagnosis present

## 2019-10-19 DIAGNOSIS — K219 Gastro-esophageal reflux disease without esophagitis: Secondary | ICD-10-CM

## 2019-10-19 DIAGNOSIS — D509 Iron deficiency anemia, unspecified: Secondary | ICD-10-CM | POA: Diagnosis present

## 2019-10-19 DIAGNOSIS — K5900 Constipation, unspecified: Secondary | ICD-10-CM

## 2019-10-19 DIAGNOSIS — G4700 Insomnia, unspecified: Secondary | ICD-10-CM | POA: Diagnosis present

## 2019-10-19 DIAGNOSIS — E871 Hypo-osmolality and hyponatremia: Secondary | ICD-10-CM | POA: Diagnosis not present

## 2019-10-19 DIAGNOSIS — T501X6A Underdosing of loop [high-ceiling] diuretics, initial encounter: Secondary | ICD-10-CM | POA: Diagnosis present

## 2019-10-19 DIAGNOSIS — F1490 Cocaine use, unspecified, uncomplicated: Secondary | ICD-10-CM

## 2019-10-19 DIAGNOSIS — K7469 Other cirrhosis of liver: Secondary | ICD-10-CM | POA: Diagnosis not present

## 2019-10-19 DIAGNOSIS — K920 Hematemesis: Secondary | ICD-10-CM | POA: Diagnosis not present

## 2019-10-19 DIAGNOSIS — D631 Anemia in chronic kidney disease: Secondary | ICD-10-CM | POA: Diagnosis present

## 2019-10-19 DIAGNOSIS — R778 Other specified abnormalities of plasma proteins: Secondary | ICD-10-CM | POA: Diagnosis present

## 2019-10-19 DIAGNOSIS — Z91138 Patient's unintentional underdosing of medication regimen for other reason: Secondary | ICD-10-CM

## 2019-10-19 DIAGNOSIS — I5041 Acute combined systolic (congestive) and diastolic (congestive) heart failure: Secondary | ICD-10-CM

## 2019-10-19 DIAGNOSIS — D649 Anemia, unspecified: Secondary | ICD-10-CM

## 2019-10-19 DIAGNOSIS — N1831 Chronic kidney disease, stage 3a: Secondary | ICD-10-CM | POA: Insufficient documentation

## 2019-10-19 DIAGNOSIS — Z7901 Long term (current) use of anticoagulants: Secondary | ICD-10-CM

## 2019-10-19 DIAGNOSIS — E876 Hypokalemia: Secondary | ICD-10-CM | POA: Diagnosis not present

## 2019-10-19 DIAGNOSIS — T8089XA Other complications following infusion, transfusion and therapeutic injection, initial encounter: Secondary | ICD-10-CM | POA: Diagnosis not present

## 2019-10-19 DIAGNOSIS — K7460 Unspecified cirrhosis of liver: Secondary | ICD-10-CM

## 2019-10-19 DIAGNOSIS — I5082 Biventricular heart failure: Secondary | ICD-10-CM | POA: Diagnosis present

## 2019-10-19 DIAGNOSIS — N179 Acute kidney failure, unspecified: Secondary | ICD-10-CM | POA: Insufficient documentation

## 2019-10-19 DIAGNOSIS — L600 Ingrowing nail: Secondary | ICD-10-CM | POA: Diagnosis present

## 2019-10-19 LAB — BLOOD GAS, ARTERIAL
Acid-base deficit: 7.4 mmol/L — ABNORMAL HIGH (ref 0.0–2.0)
Bicarbonate: 17.3 mmol/L — ABNORMAL LOW (ref 20.0–28.0)
FIO2: 0.36
O2 Saturation: 99.2 %
Patient temperature: 37
pCO2 arterial: 32 mmHg (ref 32.0–48.0)
pH, Arterial: 7.34 — ABNORMAL LOW (ref 7.350–7.450)
pO2, Arterial: 150 mmHg — ABNORMAL HIGH (ref 83.0–108.0)

## 2019-10-19 LAB — TROPONIN I (HIGH SENSITIVITY)
Troponin I (High Sensitivity): 106 ng/L (ref <18)
Troponin I (High Sensitivity): 156 ng/L (ref <18)

## 2019-10-19 LAB — BASIC METABOLIC PANEL
Anion gap: 14 (ref 5–15)
BUN: 55 mg/dL — ABNORMAL HIGH (ref 8–23)
CO2: 17 mmol/L — ABNORMAL LOW (ref 22–32)
Calcium: 9.5 mg/dL (ref 8.9–10.3)
Chloride: 108 mmol/L (ref 98–111)
Creatinine, Ser: 3.07 mg/dL — ABNORMAL HIGH (ref 0.44–1.00)
GFR, Estimated: 15 mL/min — ABNORMAL LOW (ref >60)
Glucose, Bld: 66 mg/dL — ABNORMAL LOW (ref 70–99)
Potassium: 4.4 mmol/L (ref 3.5–5.1)
Sodium: 139 mmol/L (ref 135–145)

## 2019-10-19 LAB — CBC
HCT: 38.5 % (ref 36.0–46.0)
Hemoglobin: 12.2 g/dL (ref 12.0–15.0)
MCH: 23.8 pg — ABNORMAL LOW (ref 26.0–34.0)
MCHC: 31.7 g/dL (ref 30.0–36.0)
MCV: 75.2 fL — ABNORMAL LOW (ref 80.0–100.0)
Platelets: 331 10*3/uL (ref 150–400)
RBC: 5.12 MIL/uL — ABNORMAL HIGH (ref 3.87–5.11)
RDW: 23.9 % — ABNORMAL HIGH (ref 11.5–15.5)
WBC: 5.7 10*3/uL (ref 4.0–10.5)
nRBC: 0.5 % — ABNORMAL HIGH (ref 0.0–0.2)

## 2019-10-19 LAB — BRAIN NATRIURETIC PEPTIDE: B Natriuretic Peptide: >4500 pg/mL — ABNORMAL HIGH (ref 0.0–100.0)

## 2019-10-19 MED ORDER — ENOXAPARIN SODIUM 40 MG/0.4ML ~~LOC~~ SOLN: 40.0000 mg | SUBCUTANEOUS | Status: DC

## 2019-10-19 MED ORDER — FUROSEMIDE 10 MG/ML IJ SOLN
40.0000 mg | Freq: Two times a day (BID) | INTRAMUSCULAR | Status: DC
  Administered 2019-10-19 – 2019-10-22 (×4): 40 mg via INTRAVENOUS
  Filled 2019-10-19 (×4): qty 4

## 2019-10-19 MED ORDER — ENOXAPARIN SODIUM 30 MG/0.3ML ~~LOC~~ SOLN
30.0000 mg | SUBCUTANEOUS | Status: DC
  Administered 2019-10-19: 30 mg via SUBCUTANEOUS
  Filled 2019-10-19: qty 0.3

## 2019-10-19 MED ORDER — SODIUM CHLORIDE 0.9% FLUSH
3.0000 mL | INTRAVENOUS | Status: DC | PRN
  Administered 2019-10-28: 3 mL via INTRAVENOUS

## 2019-10-19 MED ORDER — SODIUM CHLORIDE 0.9 % IV SOLN
250.0000 mL | INTRAVENOUS | Status: DC | PRN
  Administered 2019-10-25 – 2019-11-01 (×3): 250 mL via INTRAVENOUS

## 2019-10-19 MED ORDER — SODIUM CHLORIDE 0.9% FLUSH
3.0000 mL | Freq: Two times a day (BID) | INTRAVENOUS | Status: DC
  Administered 2019-10-19 – 2019-11-18 (×54): 3 mL via INTRAVENOUS

## 2019-10-19 MED ORDER — ACETAMINOPHEN 325 MG PO TABS
650.0000 mg | ORAL_TABLET | ORAL | Status: DC | PRN
  Administered 2019-10-23 – 2019-11-11 (×18): 650 mg via ORAL
  Filled 2019-10-19 (×18): qty 2

## 2019-10-19 NOTE — ED Notes (Signed)
Pt bumped up to 3L [place]

## 2019-10-19 NOTE — H&P (Signed)
History and Physical    Katelyn Lane PPI:[id number] DOB: 08-Apr-1955 DOA: 10/19/2019  PCP: [name], MD  Patient coming from: Homeless  I have personally briefly reviewed patient's old medical records in [place]  Chief Complaint: Lower extremity edema, shortness of breath  HPI: Katelyn Lane is a 64 y.o. 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  She says that for the past 3 weeks she has noticed increasing lower extremity edema with weeping.  Also has been having shortness of breath but does not know whether it is during exertion or at rest.  Denies any chest pain. Did not really want to provide much other history. She is currently homeless and has not been able to afford her medication for the past month.  She is a tobacco user and used cocaine 3 days ago.  No alcohol use.   ED Course: Febrile, normotensive and placed on 3 L via nasal cannula following hypoxia down to 89% on room air. BNP of greater than 4500. Elevated troponin up to 156.EKG on my review shows sinus rhythm with low voltage, left axis deviation and nonspecific T wave changes. Acute on chronic kidney injury with creatinine up to 3.07 from a baseline of around 1.5-1.6.  Review of Systems: Constitutional: No Weight Change, No Fever ENT/Mouth: No sore throat, No Rhinorrhea Eyes: No Eye Pain, No Vision Changes Cardiovascular: No Chest Pain, + SOB,  + Edema,  Respiratory: No Cough, No Sputum Gastrointestinal: No Nausea, No Vomiting,  Genitourinary: no Urinary Incontinence Musculoskeletal: No Arthralgias, No Myalgias Skin: No Skin Lesions, No Pruritus, Neuro: no Weakness, No Numbness Psych: No Anxiety/Panic, No Depression, no decrease appetite Heme/Lymph:  No Bruising, No Bleeding  Past Medical History:  Diagnosis Date  . CHF (congestive heart failure) ([place])   . COVID-19   . Hyperlipidemia   . Hypertension   . Renal disorder     Past Surgical History:  Procedure Laterality Date  . RIGHT/LEFT HEART CATH AND CORONARY ANGIOGRAPHY N/A 12/30/2018   Procedure: RIGHT/LEFT HEART CATH AND CORONARY ANGIOGRAPHY;  Surgeon: Corey Skains, MD;  Location: [facility];  Service: Cardiovascular;  Laterality: N/A;     reports that she has been smoking. She has never used smokeless tobacco. She reports current drug use. Drug: Cocaine. She reports that she does not drink alcohol. Social History  Allergies  Allergen Reactions  . Penicillins Hives, Rash and Other (See Comments)    Did it involve swelling of the face/tongue/throat, SOB, or low BP? No Did it involve sudden or severe rash/hives, skin peeling, or any reaction on the inside of your mouth or nose? Yes Did you need to seek medical attention at a hospital or doctor's office? Yes When did it last happen? >25 years If all above answers are "NO", may proceed with cephalosporin use.   . Ace Inhibitors Nausea And Vomiting    Family History  Problem Relation Age of Onset  . Breast cancer Neg Hx      Prior to Admission medications   Medication Sig Start Date End Date Taking? Authorizing Provider  albuterol (VENTOLIN HFA) 108 (90 Base) MCG/ACT inhaler Inhale 2 puffs into the lungs every 6 (six) hours as needed for wheezing or shortness of breath.    [provider]  allopurinol (ZYLOPRIM) 100 MG  tablet Take 1 tablet (100 mg total) by mouth daily. 06/06/19 07/27/19  Loletha Grayer, MD  ascorbic acid (VITAMIN C) 500 MG tablet Take 500 mg by mouth daily.    [provider]  aspirin 81 MG chewable tablet Chew 1 tablet (81 mg total) by mouth daily. 05/03/19   Charlynne Cousins, MD  atorvastatin (LIPITOR) 20 MG tablet Take 1 tablet (20 mg total) by mouth daily at  6 PM. 05/03/19 08/01/19  Charlynne Cousins, MD  benztropine (COGENTIN) 0.5 MG tablet Take 0.5 tablets (0.25 mg total) by mouth 2 (two) times daily. 09/02/19 10/02/19  [name], MD  bumetanide (BUMEX) 1 MG tablet Take 1 tablet (1 mg total) by mouth 2 (two) times daily. May take additional dose for worsening leg swellings/shortness of breath 06/24/19 09/22/19  Guilford Shi, MD  carvedilol (COREG) 6.25 MG tablet Hold this medication until you see your cardiologist 09/02/19   [name], MD  docusate calcium (SURFAK) 240 MG capsule Take 240 mg by mouth daily as needed for moderate constipation.  Patient not taking: Reported on 07/27/2019    [provider]  ferrous sulfate 325 (65 FE) MG tablet Take 1 tablet (325 mg total) by mouth daily with breakfast. 09/03/19 10/03/19  [name], MD  FLUoxetine (PROZAC) 10 MG capsule Take 3 capsules (30 mg total) by mouth daily. 09/03/19 10/03/19  [name], MD  gabapentin (NEURONTIN) 300 MG capsule Take 1 capsule (300 mg total) by mouth 3 (three) times daily. 09/02/19 10/02/19  [name], MD  Ipratropium-Albuterol (COMBIVENT) 20-100 MCG/ACT AERS respimat Inhale 1 puff into the lungs every 6 (six) hours. 05/26/19   Loletha Grayer, MD  loratadine (CLARITIN) 10 MG tablet Take 10 mg by mouth daily as needed for allergies.     [provider]  midodrine (PROAMATINE) 2.5 MG tablet Take 1 tablet (2.5 mg total) by mouth 3 (three) times daily with meals as needed (for MAP <65 and/or SBP <100). 09/02/19   [name], MD  nitroGLYCERIN (NITROSTAT) 0.4 MG SL tablet Place 1 tablet (0.4 mg total) under the tongue every 5 (five) minutes as needed for chest pain. 05/03/19   Charlynne Cousins, MD  pantoprazole (PROTONIX) 40 MG tablet Take 1 tablet (40 mg total) by mouth daily. 05/03/19   Charlynne Cousins, MD  rivaroxaban (XARELTO) 20 MG TABS tablet Take 1 tablet (20 mg total) by mouth daily with supper. For the  blood clot in your heart. 05/03/19 08/01/19  Charlynne Cousins, MD  sacubitril-valsartan (ENTRESTO) 49-51 MG Take 1 tablet by mouth 2 (two) times daily. 05/26/19   Loletha Grayer, MD  spironolactone (ALDACTONE) 25 MG tablet Take 1 tablet (25 mg total) by mouth daily. 06/25/19 08/24/19  Guilford Shi, MD  spironolactone (ALDACTONE) 25 MG tablet Take 1 tablet (25 mg total) by mouth daily. Hold this medication until you see your cardiologist 09/02/19 11/01/19  [name], MD  thiothixene (NAVANE) 1 MG capsule Take 1 capsule (1 mg total) by mouth 2 (two) times daily. 09/02/19 10/02/19  [name], MD    Physical Exam: Vitals:   10/19/19 1915 10/19/19 1930 10/19/19 1945 10/19/19 2030  BP:      Pulse: 89 87 (!) 119 79  Resp:      Temp:      TempSrc:      SpO2: 99% 100% 100% 92%  Weight:      Height:  Constitutional: NAD, calm, comfortable, morbidly obese female laying on her right side eating with food trickling down the side of her mouth Vitals:   10/19/19 1915 10/19/19 1930 10/19/19 1945 10/19/19 2030  BP:      Pulse: 89 87 (!) 119 79  Resp:      Temp:      TempSrc:      SpO2: 99% 100% 100% 92%  Weight:      Height:       Eyes: PERRL, lids and conjunctivae normal ENMT: Mucous membranes are moist. Neck: normal, supple Respiratory: clear to auscultation bilaterally, no wheezing, no crackles. Normal respiratory effort. No accessory muscle use.  Cardiovascular: Regular rate and rhythm, no murmurs / rubs / gallops.  Significant +3 pitting edema with tightening of skin of bilateral lower extremity abdomen: Generalized anasarca on the abdomen, no tenderness, no masses palpated.  Bowel sounds positive.  Musculoskeletal: no clubbing / cyanosis. No joint deformity upper and lower extremities. Good ROM, no contractures. Normal muscle tone.  Skin: no rashes, lesions, ulcers. No induration Neurologic: CN 2-12 grossly intact. Sensation intact,  Strength 5/5 in all 4.   Psychiatric:  Alert and oriented x 3. Normal mood.    Labs on Admission: I have personally reviewed following labs and imaging studies  CBC: Recent Labs  Lab 10/19/19 0935  WBC 5.7  HGB 12.2  HCT 38.5  MCV 75.2*  PLT 937   Basic Metabolic Panel: Recent Labs  Lab 10/19/19 0935  NA 139  K 4.4  CL 108  CO2 17*  GLUCOSE 66*  BUN 55*  CREATININE 3.07*  CALCIUM 9.5   GFR: Estimated Creatinine Clearance: 23.2 mL/min (A) (by C-G formula based on SCr of 3.07 mg/dL (H)). Liver Function Tests: No results for input(s): AST, ALT, ALKPHOS, BILITOT, PROT, ALBUMIN in the last 168 hours. No results for input(s): LIPASE, AMYLASE in the last 168 hours. No results for input(s): AMMONIA in the last 168 hours. Coagulation Profile: No results for input(s): INR, PROTIME in the last 168 hours. Cardiac Enzymes: No results for input(s): CKTOTAL, CKMB, CKMBINDEX, TROPONINI in the last 168 hours. BNP (last 3 results) No results for input(s): PROBNP in the last 8760 hours. HbA1C: No results for input(s): HGBA1C in the last 72 hours. CBG: No results for input(s): GLUCAP in the last 168 hours. Lipid Profile: No results for input(s): CHOL, HDL, LDLCALC, TRIG, CHOLHDL, LDLDIRECT in the last 72 hours. Thyroid Function Tests: No results for input(s): TSH, T4TOTAL, FREET4, T3FREE, THYROIDAB in the last 72 hours. Anemia Panel: No results for input(s): VITAMINB12, FOLATE, FERRITIN, TIBC, IRON, RETICCTPCT in the last 72 hours. Urine analysis:    Component Value Date/Time   COLORURINE YELLOW (A) 07/27/2019 0836   APPEARANCEUR CLEAR (A) 07/27/2019 0836   LABSPEC 1.009 07/27/2019 0836   PHURINE 6.0 07/27/2019 0836   GLUCOSEU NEGATIVE 07/27/2019 0836   HGBUR NEGATIVE 07/27/2019 0836   BILIRUBINUR NEGATIVE 07/27/2019 0836   KETONESUR NEGATIVE 07/27/2019 0836   PROTEINUR NEGATIVE 07/27/2019 0836   NITRITE NEGATIVE 07/27/2019 0836   LEUKOCYTESUR NEGATIVE 07/27/2019 0836    Radiological Exams on  Admission: DG Chest 2 View  Result Date: 10/19/2019 CLINICAL DATA:  Shortness of breath. EXAM: CHEST - 2 VIEW COMPARISON:  08/27/2019 and chest CT 08/15/2019 FINDINGS: Stable cardiomegaly. No overt pulmonary edema. Difficult to exclude densities at the left lung base and small left pleural effusion. Negative for a pneumothorax. IMPRESSION: Stable cardiomegaly with probable left basilar chest densities. Difficult to exclude a  small left pleural effusion. Minimal change from the exam on 08/27/2019. Electronically Signed   By: Markus Daft M.D.   On: 10/19/2019 09:42      Assessment/Plan   Acute on chronic systolic and diastolic heart failure BNP >4500 Secondary to lack of medication due to homelessness Patient presented with significant lower extremity edema and anasarca Last echocardiogram on 06/2019 showed EF of 25 to 30% with grade 2 diastolic dysfunction Start with IV Lasix 40 twice daily and will need to continue to uptitrate with strict monitoring of I's and O's.  Daily weights Will need to eventually resume beta blocker and Entresto  Acute hypoxic respiratory failure secondary to CHF exacerbation Treatment as above   Acute on chronic CKD stage IIIb  Creatinine of 3.07 from a prior of around 1.66 Likely cardiorenal- monitor with diuresis  History of ventricular thrombus in 01/2019 and hx of CVA Resume Xarelto  Cocaine use reports last use 3 days ago  obtain UDS  DVT prophylaxis: Xarelto  Code Status: Full Family Communication: Plan discussed with patient at bedside  disposition Plan: Home with at least 2 midnight stays  Consults called:  Admission status: inpatient   Status is: Inpatient  Remains inpatient appropriate because:Inpatient level of care appropriate due to severity of illness   Dispo: The patient is from: Homeless              Anticipated d/c is to: TBD              Anticipated d/c date is: > 3 days              Patient currently is not medically stable  to d/c.         Orene Desanctis DO [facility]   If 7PM-7AM, please contact night-coverage [email or web address]   10/19/2019, 9:09 PM

## 2019-10-19 NOTE — ED Notes (Signed)
Pt refusing to keep vital sign monitoring devices on. MD goodman aware

## 2019-10-19 NOTE — ED Notes (Signed)
Patient refusing vitals at this time until meal tray is brought. Patient advised that diet tray has been ordered, patient still refusing vital signs and cardiac monitor.

## 2019-10-19 NOTE — ED Triage Notes (Signed)
Pt comes into the ED via EMS from home with c/o swelling in BL LE for over a week with SOB, states she "Im retaining fluid". States she has a hx of CHF. Pt is a/ox4 on arrival  Pt states "I have become homeless and living in my car".Marland Kitchen

## 2019-10-19 NOTE — ED Provider Notes (Signed)
[facility] Emergency Department Provider Note  ____________________________________________   I have reviewed the triage vital signs and the nursing notes.   HISTORY  Chief Complaint Leg Swelling   History limited by and level 5 caveat due to: Confusion/poor historian   HPI Katelyn Lane is a 64 y.o. female who presents to the emergency department today because of concern for fluid overload.  The patient states that she has noticed increased lower extremity edema over the past few days.  This has been accompanied by some leaking of fluid.  She describes her legs as feeling tight.  Additionally the patient states that she is been having shortness of breath.  Patient does have a history of heart failure.  She denies any fevers.  She states she has had some discomfort in her chest.   Records reviewed. Per medical record review patient has a history of CHF, HLD, HTN.   Past Medical History:  Diagnosis Date  . CHF (congestive heart failure) ([place])   . COVID-19   . Hyperlipidemia   . Hypertension   . Renal disorder     Patient Active Problem List   Diagnosis Date Noted  . Acute CHF (congestive heart failure) ([place]) 07/27/2019  . Prolonged QT interval 06/24/2019  . History of anemia due to chronic kidney disease 06/19/2019  . CHF, acute on chronic ([place]) 06/03/2019  . Acute on chronic combined systolic (congestive) and diastolic (congestive) heart failure ([place]) 06/03/2019  . Accelerated hypertension   . Ventricular tachycardia, non-sustained ([place])   . Chronic obstructive pulmonary disease ([place])   . Pulmonary hypertension ([place])   . Acute metabolic encephalopathy 74/94/4967  . History of substance abuse ([place]) 05/21/2019  . CHF (congestive heart failure) ([place]) 05/21/2019  . Acute systolic CHF (congestive heart failure) ([place]) 04/08/2019  . Acute on chronic combined systolic and diastolic CHF (congestive heart failure) ([place]) 03/24/2019  . COVID-19 03/06/2019   . Acute respiratory failure with hypoxia ([place]) 03/06/2019  . Hypokalemia 03/06/2019  . LV (left ventricular) mural thrombus 03/06/2019  . CKD (chronic kidney disease), stage III ([place]) 03/06/2019  . Nausea and vomiting 02/16/2019  . Abdominal pain 02/16/2019  . Rhabdomyolysis 02/16/2019  . Acute kidney injury superimposed on CKD ([place]) 02/16/2019  . Crack cocaine use 02/16/2019  . Tobacco abuse 02/16/2019  . Acute on chronic systolic CHF (congestive heart failure) ([place]) 02/16/2019  . Acute congestive heart failure ([place]) 02/13/2019  . Respiratory failure with hypoxia ([place]) 01/31/2019  . Stroke ([place]) 01/18/2019  . Dilated cardiomyopathy ([place]) 12/31/2018  . Chest tightness 12/31/2018  . Elevated troponin 12/31/2018  . Hypertensive urgency 12/31/2018  . Hyperlipidemia 12/31/2018  . Depression 12/31/2018  . HFrEF (heart failure with reduced ejection fraction) ([place]) 12/29/2018    Past Surgical History:  Procedure Laterality Date  . RIGHT/LEFT HEART CATH AND CORONARY ANGIOGRAPHY N/A 12/30/2018   Procedure: RIGHT/LEFT HEART CATH AND CORONARY ANGIOGRAPHY;  Surgeon: Corey Skains, MD;  Location: [place] [facility];  Service: Cardiovascular;  Laterality: N/A;    Prior to Admission medications   Medication Sig Start Date End Date Taking? Authorizing Provider  albuterol (VENTOLIN HFA) 108 (90 Base) MCG/ACT inhaler Inhale 2 puffs into the lungs every 6 (six) hours as needed for wheezing or shortness of breath.    [provider]  allopurinol (ZYLOPRIM) 100 MG tablet Take 1 tablet (100 mg total) by mouth daily. 06/06/19 07/27/19  Loletha Grayer, MD  ascorbic acid (VITAMIN C) 500 MG tablet Take 500 mg by mouth  daily.    [provider]  aspirin 81 MG chewable tablet Chew 1 tablet (81 mg total) by mouth daily. 05/03/19   Charlynne Cousins, MD  atorvastatin (LIPITOR) 20 MG tablet Take 1 tablet (20 mg total) by mouth daily at 6 PM. 05/03/19 08/01/19  Charlynne Cousins, MD   benztropine (COGENTIN) 0.5 MG tablet Take 0.5 tablets (0.25 mg total) by mouth 2 (two) times daily. 09/02/19 10/02/19  [name], MD  bumetanide (BUMEX) 1 MG tablet Take 1 tablet (1 mg total) by mouth 2 (two) times daily. May take additional dose for worsening leg swellings/shortness of breath 06/24/19 09/22/19  Guilford Shi, MD  carvedilol (COREG) 6.25 MG tablet Hold this medication until you see your cardiologist 09/02/19   [name], MD  docusate calcium (SURFAK) 240 MG capsule Take 240 mg by mouth daily as needed for moderate constipation.  Patient not taking: Reported on 07/27/2019    [provider]  ferrous sulfate 325 (65 FE) MG tablet Take 1 tablet (325 mg total) by mouth daily with breakfast. 09/03/19 10/03/19  [name], MD  FLUoxetine (PROZAC) 10 MG capsule Take 3 capsules (30 mg total) by mouth daily. 09/03/19 10/03/19  [name], MD  gabapentin (NEURONTIN) 300 MG capsule Take 1 capsule (300 mg total) by mouth 3 (three) times daily. 09/02/19 10/02/19  [name], MD  Ipratropium-Albuterol (COMBIVENT) 20-100 MCG/ACT AERS respimat Inhale 1 puff into the lungs every 6 (six) hours. 05/26/19   Loletha Grayer, MD  loratadine (CLARITIN) 10 MG tablet Take 10 mg by mouth daily as needed for allergies.     [provider]  midodrine (PROAMATINE) 2.5 MG tablet Take 1 tablet (2.5 mg total) by mouth 3 (three) times daily with meals as needed (for MAP <65 and/or SBP <100). 09/02/19   [name], MD  nitroGLYCERIN (NITROSTAT) 0.4 MG SL tablet Place 1 tablet (0.4 mg total) under the tongue every 5 (five) minutes as needed for chest pain. 05/03/19   Charlynne Cousins, MD  pantoprazole (PROTONIX) 40 MG tablet Take 1 tablet (40 mg total) by mouth daily. 05/03/19   Charlynne Cousins, MD  rivaroxaban (XARELTO) 20 MG TABS tablet Take 1 tablet (20 mg total) by mouth daily with supper. For the blood clot in your heart. 05/03/19 08/01/19   Charlynne Cousins, MD  sacubitril-valsartan (ENTRESTO) 49-51 MG Take 1 tablet by mouth 2 (two) times daily. 05/26/19   Loletha Grayer, MD  spironolactone (ALDACTONE) 25 MG tablet Take 1 tablet (25 mg total) by mouth daily. 06/25/19 08/24/19  Guilford Shi, MD  spironolactone (ALDACTONE) 25 MG tablet Take 1 tablet (25 mg total) by mouth daily. Hold this medication until you see your cardiologist 09/02/19 11/01/19  [name], MD  thiothixene (NAVANE) 1 MG capsule Take 1 capsule (1 mg total) by mouth 2 (two) times daily. 09/02/19 10/02/19  [name], MD    Allergies Penicillins and Ace inhibitors  Family History  Problem Relation Age of Onset  . Breast cancer Neg Hx     Social History Social History   Tobacco Use  . Smoking status: Current Every Day Smoker  . Smokeless tobacco: Never Used  Substance Use Topics  . Alcohol use: No    Alcohol/week: 0.0 standard drinks  . Drug use: Yes    Types: Cocaine    Comment: 3-4 days ago    Review of Systems Constitutional: No fever/chills Eyes: No visual changes. ENT: No  sore throat. Cardiovascular: Denies chest pain. Respiratory: Positive for shortness of breath. Gastrointestinal: No abdominal pain.  No nausea, no vomiting.  No diarrhea.   Genitourinary: Negative for dysuria. Musculoskeletal: Positive for lower extremity edema.  Skin: Negative for rash. Neurological: Negative for headaches, focal weakness or numbness.  ____________________________________________   PHYSICAL EXAM:  VITAL SIGNS: ED Triage Vitals  Enc Vitals Group     BP 10/19/19 0911 117/86     Pulse Rate 10/19/19 0911 91     Resp 10/19/19 0911 20     Temp 10/19/19 0911 97.7 F (36.5 C)     Temp Source 10/19/19 0911 Oral     SpO2 10/19/19 0911 (!) 89 %     Weight 10/19/19 0913 213 lb 13.5 oz (97 kg)     Height 10/19/19 0913 5\' 9"  (1.753 m)     Head Circumference --      Peak Flow --      Pain Score 10/19/19 0913 9   Constitutional:  Somnolent.  Eyes: Conjunctivae are normal.  ENT      Head: Normocephalic and atraumatic.      Nose: No congestion/rhinnorhea.      Mouth/Throat: Mucous membranes are moist.      Neck: No stridor. Hematological/Lymphatic/Immunilogical: No cervical lymphadenopathy. Cardiovascular: Normal rate, regular rhythm.  No murmurs, rubs, or gallops.  Respiratory: Normal respiratory effort without tachypnea nor retractions. Breath sounds are clear and equal bilaterally. No wheezes/rales/rhonchi. Gastrointestinal: Soft and non tender. No rebound. No guarding.  Genitourinary: Deferred Musculoskeletal: Normal range of motion in all extremities. 1+ bilateral lower extremity edema.  Neurologic:  Somewhat somnolent. Awakens to verbal stimuli. Skin:  Skin is warm, dry and intact. No rash noted. Psychiatric: Mood and affect are normal. Speech and behavior are normal. Patient exhibits appropriate insight and judgment.  ____________________________________________    LABS (pertinent positives/negatives)  BNP >4,500 CBC wbc 5.7, hgb 12.2, plt 331 BMP na 139, k 4.4, cl 108, cr 3.07 Trop hs 106 ____________________________________________   EKG  I, Nance Pear, attending physician, personally viewed and interpreted this EKG  EKG Time: 0933 Rate: 84 Rhythm: sinus rhythm with pvc Axis: left axis deviation Intervals: qtc 446 QRS: low voltage qrs ST changes: no st elevation Impression: abnormal ekg  ____________________________________________    RADIOLOGY  CXR Stable cardiomegaly. No overt pulmonary edema  ____________________________________________   PROCEDURES  Procedures  CRITICAL CARE Performed by: Nance Pear   Total critical care time: 30 minutes  Critical care time was exclusive of separately billable procedures and treating other patients.  Critical care was necessary to treat or prevent imminent or life-threatening deterioration.  Critical care was time spent  personally by me on the following activities: development of treatment plan with patient and/or surrogate as well as nursing, discussions with consultants, evaluation of patient's response to treatment, examination of patient, obtaining history from patient or surrogate, ordering and performing treatments and interventions, ordering and review of laboratory studies, ordering and review of radiographic studies, pulse oximetry and re-evaluation of patient's condition.  ____________________________________________   INITIAL IMPRESSION / ASSESSMENT AND PLAN / ED COURSE  Pertinent labs & imaging results that were available during my care of the patient were reviewed by me and considered in my medical decision making (see chart for details).   Patient presented to the emergency department today because of concern for edema and shortness of breath. The patient's bnp is significantly elevated. Troponin is elevated however not as significantly as previous. Creatinine is elevated. Do have  concern for AKI. Will give lasix. Will plan on admission. Discussed findings with patient.   ____________________________________________   FINAL CLINICAL IMPRESSION(S) / ED DIAGNOSES  Final diagnoses:  AKI (acute kidney injury) ([place])  Congestive heart failure, unspecified HF chronicity, unspecified heart failure type ([place])  Peripheral edema     Note: This dictation was prepared with Dragon dictation. Any transcriptional errors that result from this process are unintentional     Nance Pear, MD 10/19/19 1901

## 2019-10-19 NOTE — Progress Notes (Signed)
PHARMACIST - PHYSICIAN COMMUNICATION  CONCERNING:  Enoxaparin (Lovenox) for DVT Prophylaxis    RECOMMENDATION: Patient was prescribed enoxaprin 40mg  q24 hours for VTE prophylaxis.   Filed Weights   10/19/19 0913  Weight: 97 kg (213 lb 13.5 oz)    Body mass index is 31.58 kg/m.  Estimated Creatinine Clearance: 23.2 mL/min (A) (by C-G formula based on SCr of 3.07 mg/dL (H)).    Patient is candidate for enoxaparin 30mg  every 24 hours based on CrCl <23ml/min or Weight <45kg  DESCRIPTION: Pharmacy has adjusted enoxaparin dose per [facility] policy.  Patient is now receiving enoxaparin 30 mg every 24 hours    Rowland Lathe, PharmD Clinical Pharmacist  10/19/2019 9:09 PM

## 2019-10-19 NOTE — ED Triage Notes (Signed)
Pt in via EMS from home with c/o swelling in her legs for a week and vomiting that started yesterday. 126/70, HR 78, 97% RA.

## 2019-10-19 NOTE — ED Notes (Signed)
Report attempted x 1

## 2019-10-19 NOTE — ED Notes (Signed)
Pt was given a sandwich tray and milk to drink.

## 2019-10-20 ENCOUNTER — Inpatient Hospital Stay: Payer: Medicaid Other

## 2019-10-20 DIAGNOSIS — I513 Intracardiac thrombosis, not elsewhere classified: Secondary | ICD-10-CM

## 2019-10-20 DIAGNOSIS — I5041 Acute combined systolic (congestive) and diastolic (congestive) heart failure: Secondary | ICD-10-CM | POA: Diagnosis not present

## 2019-10-20 DIAGNOSIS — J9601 Acute respiratory failure with hypoxia: Secondary | ICD-10-CM | POA: Diagnosis not present

## 2019-10-20 DIAGNOSIS — R609 Edema, unspecified: Secondary | ICD-10-CM

## 2019-10-20 DIAGNOSIS — N179 Acute kidney failure, unspecified: Secondary | ICD-10-CM | POA: Diagnosis not present

## 2019-10-20 DIAGNOSIS — F1410 Cocaine abuse, uncomplicated: Secondary | ICD-10-CM | POA: Diagnosis not present

## 2019-10-20 LAB — URINE DRUG SCREEN, QUALITATIVE (ARMC ONLY)
Amphetamines, Ur Screen: NOT DETECTED
Barbiturates, Ur Screen: NOT DETECTED
Benzodiazepine, Ur Scrn: NOT DETECTED
Cannabinoid 50 Ng, Ur ~~LOC~~: NOT DETECTED
Cocaine Metabolite,Ur ~~LOC~~: POSITIVE — AB
MDMA (Ecstasy)Ur Screen: NOT DETECTED
Methadone Scn, Ur: NOT DETECTED
Opiate, Ur Screen: NOT DETECTED
Phencyclidine (PCP) Ur S: NOT DETECTED
Tricyclic, Ur Screen: NOT DETECTED

## 2019-10-20 LAB — BASIC METABOLIC PANEL
Anion gap: 9 (ref 5–15)
BUN: 56 mg/dL — ABNORMAL HIGH (ref 8–23)
CO2: 23 mmol/L (ref 22–32)
Calcium: 9.4 mg/dL (ref 8.9–10.3)
Chloride: 110 mmol/L (ref 98–111)
Creatinine, Ser: 3.14 mg/dL — ABNORMAL HIGH (ref 0.44–1.00)
GFR, Estimated: 15 mL/min — ABNORMAL LOW (ref >60)
Glucose, Bld: 118 mg/dL — ABNORMAL HIGH (ref 70–99)
Potassium: 4.7 mmol/L (ref 3.5–5.1)
Sodium: 142 mmol/L (ref 135–145)

## 2019-10-20 LAB — RESPIRATORY PANEL BY RT PCR (FLU A&B, COVID)
Influenza A by PCR: NEGATIVE
Influenza B by PCR: NEGATIVE
SARS Coronavirus 2 by RT PCR: NEGATIVE

## 2019-10-20 MED ORDER — MIDODRINE HCL 5 MG PO TABS
5.0000 mg | ORAL_TABLET | Freq: Three times a day (TID) | ORAL | Status: DC
  Administered 2019-10-20 – 2019-10-21 (×3): 5 mg via ORAL
  Filled 2019-10-20 (×4): qty 1

## 2019-10-20 MED ORDER — RIVAROXABAN 15 MG PO TABS
15.0000 mg | ORAL_TABLET | Freq: Every day | ORAL | Status: DC
  Administered 2019-10-20 – 2019-10-24 (×5): 15 mg via ORAL
  Filled 2019-10-20 (×5): qty 1

## 2019-10-20 MED ORDER — ORAL CARE MOUTH RINSE
15.0000 mL | Freq: Two times a day (BID) | OROMUCOSAL | Status: DC
  Administered 2019-10-21 – 2020-01-29 (×137): 15 mL via OROMUCOSAL

## 2019-10-20 NOTE — Progress Notes (Signed)
[facility]  ROUNDING NOTE   Subjective:   [name] admitted on 10/19/2019 for Peripheral edema [R60.9] Acute CHF (congestive heart failure) ([place]) [I50.9] AKI (acute kidney injury) ([place]) [N17.9] Congestive heart failure, unspecified HF chronicity, unspecified heart failure type ([place]) [I50.9]  Patient presented with shortness of breath and requiring 6 liters oxygen [place]. Patient was found to have peripheral edema. She was started on IV furosemide with minimal improvement in creatinine. However yielding 2 liters of urine output. Currently with hypotension.   Patient is currently homeless and unable to get her medications.   Objective:  Vital signs in last 24 hours:  Temp:  [97.6 F (36.4 C)-98.8 F (37.1 C)] 97.6 F (36.4 C) (10/12 1139) Pulse Rate:  [51-119] 77 (10/12 1139) Resp:  [18-20] 18 (10/12 1139) BP: (90-152)/(59-100) 98/63 (10/12 1139) SpO2:  [92 %-100 %] 100 % (10/12 1139) Weight:  [112.9 kg-120.5 kg] 112.9 kg (10/12 0554)  Weight change:  Filed Weights   10/19/19 0913 10/20/19 0043 10/20/19 0554  Weight: 97 kg 120.5 kg 112.9 kg    Intake/Output: I/O last 3 completed shifts: In: 240 [P.O.:240] Out: 2000 [Urine:2000]   Intake/Output this shift:  Total I/O In: 240 [P.O.:240] Out: -   Physical Exam: General: NAD, laying in bed  Head: +facial edema  Eyes: Anicteric, PERRL  Neck: Supple, trachea midline  Lungs:  Bilateral crackles, 6L [place] O2  Heart: Regular rate and rhythm  Abdomen:  Soft, nontender,   Extremities: + peripheral edema.  Neurologic: Lethargic,  Skin: No lesions  Access: none    Basic Metabolic Panel: Recent Labs  Lab 10/19/19 0935 10/20/19 0356  NA 139 142  K 4.4 4.7  CL 108 110  CO2 17* 23  GLUCOSE 66* 118*  BUN 55* 56*  CREATININE 3.07* 3.14*  CALCIUM 9.5 9.4    Liver Function Tests: No results for input(s): AST, ALT, ALKPHOS, BILITOT, PROT, ALBUMIN in the last 168 hours. No results for input(s): LIPASE,  AMYLASE in the last 168 hours. No results for input(s): AMMONIA in the last 168 hours.  CBC: Recent Labs  Lab 10/19/19 0935  WBC 5.7  HGB 12.2  HCT 38.5  MCV 75.2*  PLT 331    Cardiac Enzymes: No results for input(s): CKTOTAL, CKMB, CKMBINDEX, TROPONINI in the last 168 hours.  BNP: Invalid input(s): POCBNP  CBG: No results for input(s): GLUCAP in the last 168 hours.  Microbiology: Results for orders placed or performed during the hospital encounter of 10/19/19  Respiratory Panel by RT PCR (Flu A&B, Covid) - Nasopharyngeal Swab     Status: None   Collection Time: 10/19/19 11:13 PM   Specimen: Nasopharyngeal Swab  Result Value Ref Range Status   SARS Coronavirus 2 by RT PCR NEGATIVE NEGATIVE Final    Comment: (NOTE) SARS-CoV-2 target nucleic acids are NOT DETECTED.  The SARS-CoV-2 RNA is generally detectable in upper respiratoy specimens during the acute phase of infection. The lowest concentration of SARS-CoV-2 viral copies this assay can detect is 131 copies/mL. A negative result does not preclude SARS-Cov-2 infection and should not be used as the sole basis for treatment or other patient management decisions. A negative result may occur with  improper specimen collection/handling, submission of specimen other than nasopharyngeal swab, presence of viral mutation(s) within the areas targeted by this assay, and inadequate number of viral copies (<131 copies/mL). A negative result must be combined with clinical observations, patient history, and epidemiological information. The expected result is Negative.  Fact  Sheet for Patients:  PinkCheek.be  Fact Sheet for Healthcare Providers:  GravelBags.it  This test is no t yet approved or cleared by the [place] FDA and  has been authorized for detection and/or diagnosis of SARS-CoV-2 by FDA under an Emergency Use Authorization (EUA). This EUA will remain  in  effect (meaning this test can be used) for the duration of the COVID-19 declaration under Section 564(b)(1) of the Act, 21 U.S.C. section 360bbb-3(b)(1), unless the authorization is terminated or revoked sooner.     Influenza A by PCR NEGATIVE NEGATIVE Final   Influenza B by PCR NEGATIVE NEGATIVE Final    Comment: (NOTE) The Xpert Xpress SARS-CoV-2/FLU/RSV assay is intended as an aid in  the diagnosis of influenza from Nasopharyngeal swab specimens and  should not be used as a sole basis for treatment. Nasal washings and  aspirates are unacceptable for Xpert Xpress SARS-CoV-2/FLU/RSV  testing.  Fact Sheet for Patients: PinkCheek.be  Fact Sheet for Healthcare Providers: GravelBags.it  This test is not yet approved or cleared by the [place] FDA and  has been authorized for detection and/or diagnosis of SARS-CoV-2 by  FDA under an Emergency Use Authorization (EUA). This EUA will remain  in effect (meaning this test can be used) for the duration of the  Covid-19 declaration under Section 564(b)(1) of the Act, 21  U.S.C. section 360bbb-3(b)(1), unless the authorization is  terminated or revoked. Performed at [facility], [address] [place], [place] [address]     Coagulation Studies: No results for input(s): LABPROT, INR in the last 72 hours.  Urinalysis: No results for input(s): COLORURINE, LABSPEC, PHURINE, GLUCOSEU, HGBUR, BILIRUBINUR, KETONESUR, PROTEINUR, UROBILINOGEN, NITRITE, LEUKOCYTESUR in the last 72 hours.  Invalid input(s): APPERANCEUR    Imaging: DG Chest 2 View  Result Date: 10/19/2019 CLINICAL DATA:  Shortness of breath. EXAM: CHEST - 2 VIEW COMPARISON:  08/27/2019 and chest CT 08/15/2019 FINDINGS: Stable cardiomegaly. No overt pulmonary edema. Difficult to exclude densities at the left lung base and small left pleural effusion. Negative for a pneumothorax. IMPRESSION: Stable  cardiomegaly with probable left basilar chest densities. Difficult to exclude a small left pleural effusion. Minimal change from the exam on 08/27/2019. Electronically Signed   By: Markus Daft M.D.   On: 10/19/2019 09:42     Medications:    sodium chloride      furosemide  40 mg Intravenous Q12H   [START ON 10/21/2019] mouth rinse  15 mL Mouth Rinse BID   midodrine  5 mg Oral TID WC   rivaroxaban  15 mg Oral Daily   sodium chloride flush  3 mL Intravenous Q12H   sodium chloride, acetaminophen, sodium chloride flush  Assessment/ Plan:  [name] is a 64 y.o. black female with congestive heart failure, hypertension, hyperlipidemia, gout, COPD, anemia, cocaine abuse who is admitted to [facility] on 10/19/2019 for Peripheral edema [R60.9] Acute CHF (congestive heart failure) ([place]) [I50.9] AKI (acute kidney injury) ([place]) [N17.9] Congestive heart failure, unspecified HF chronicity, unspecified heart failure type ([place]) [I50.9]  1. Acute kidney injury on chronic kidney disease stage IIIB. Baseline creatinine of 1.66, GFR of 32. With history of proteinuria.  Chronic kidney disease secondary to hypertension  Acute renal failure secondary to acute cardiorenal syndrome - holding Entresto - Check renal ultrasound - Low threshold for initiating renal replacement therapy  2. Hypotension with acute exacerbation of systolic and diastolic congestive heart failure. Echocardiogram 06/22/19 EF of 25-30%.  - IV furosemide - start midodrine -  Recommend cardiology consultation. Patient may benefit from positive inotropes.     LOS: 1 Jeanpaul Biehl 10/12/20212:07 PM

## 2019-10-20 NOTE — Plan of Care (Signed)
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
## 2019-10-20 NOTE — Progress Notes (Addendum)
NP Katelyn Lane notified of pts refusal to wear telemetry monitor as well as refusal to complete REDs vest test. Pt educated on importance of wearing telemetry monitor as well as completing REDs test, pt still adamant about refusal. Pt also refusing to answer admission questionnaire currently as well as 2 RN skin check. No further actions at this time, will continue to monitor.
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
## 2019-10-20 NOTE — Progress Notes (Signed)
Mobility Specialist - Progress Note   10/20/19 1400  Mobility  Activity Refused mobility  Mobility performed by Mobility specialist    Pt was sleeping soundly on arrival. Pt was easily awakened, however, declined mobility w/ no reason specified. Will attempt session at another date/time.    Kathee Delton Mobility Specialist 10/20/19, 2:24 PM

## 2019-10-20 NOTE — Progress Notes (Addendum)
ANTICOAGULATION CONSULT NOTE - Initial Consult  Pharmacy Consult for Xarelto (home med) Indication: h/o CVA  Allergies  Allergen Reactions  . Penicillins Hives, Rash and Other (See Comments)    Did it involve swelling of the face/tongue/throat, SOB, or low BP? No Did it involve sudden or severe rash/hives, skin peeling, or any reaction on the inside of your mouth or nose? Yes Did you need to seek medical attention at a hospital or doctor's office? Yes When did it last happen? >25 years If all above answers are "NO", may proceed with cephalosporin use.   . Ace Inhibitors Nausea And Vomiting    Patient Measurements: Height: 5\' 6"  (167.6 cm) Weight: 120.5 kg (265 lb 11.2 oz) IBW/kg (Calculated) : 59.3  Vital Signs: Temp: 97.6 F (36.4 C) (10/12 0015) Temp Source: Oral (10/12 0015) BP: 126/100 (10/12 0015) Pulse Rate: 92 (10/12 0015)  Labs: Recent Labs    10/19/19 0935 10/19/19 1851  HGB 12.2  --   HCT 38.5  --   PLT 331  --   CREATININE 3.07*  --   TROPONINIHS 106* 156*    Estimated Creatinine Clearance: 24.8 mL/min (A) (by C-G formula based on SCr of 3.07 mg/dL (H)).   Medical History: Past Medical History:  Diagnosis Date  . CHF (congestive heart failure) ([place])   . COVID-19   . Hyperlipidemia   . Hypertension   . Renal disorder     Medications:  Medications Prior to Admission  Medication Sig Dispense Refill Last Dose  . albuterol (VENTOLIN HFA) 108 (90 Base) MCG/ACT inhaler Inhale 2 puffs into the lungs every 6 (six) hours as needed for wheezing or shortness of breath.     . allopurinol (ZYLOPRIM) 100 MG tablet Take 1 tablet (100 mg total) by mouth daily. 30 tablet 0   . ascorbic acid (VITAMIN C) 500 MG tablet Take 500 mg by mouth daily.     Marland Kitchen aspirin 81 MG chewable tablet Chew 1 tablet (81 mg total) by mouth daily. 30 tablet 0   . atorvastatin (LIPITOR) 20 MG tablet Take 1 tablet (20 mg total) by mouth daily at 6 PM. 30 tablet 2   . benztropine  (COGENTIN) 0.5 MG tablet Take 0.5 tablets (0.25 mg total) by mouth 2 (two) times daily. 30 tablet 0   . bumetanide (BUMEX) 1 MG tablet Take 1 tablet (1 mg total) by mouth 2 (two) times daily. May take additional dose for worsening leg swellings/shortness of breath 60 tablet 2   . carvedilol (COREG) 6.25 MG tablet Hold this medication until you see your cardiologist 30 tablet 2   . docusate calcium (SURFAK) 240 MG capsule Take 240 mg by mouth daily as needed for moderate constipation.  (Patient not taking: Reported on 07/27/2019)     . ferrous sulfate 325 (65 FE) MG tablet Take 1 tablet (325 mg total) by mouth daily with breakfast. 30 tablet 0   . FLUoxetine (PROZAC) 10 MG capsule Take 3 capsules (30 mg total) by mouth daily. 90 capsule 0   . gabapentin (NEURONTIN) 300 MG capsule Take 1 capsule (300 mg total) by mouth 3 (three) times daily. 90 capsule 0   . Ipratropium-Albuterol (COMBIVENT) 20-100 MCG/ACT AERS respimat Inhale 1 puff into the lungs every 6 (six) hours. 4 g 0   . loratadine (CLARITIN) 10 MG tablet Take 10 mg by mouth daily as needed for allergies.      . midodrine (PROAMATINE) 2.5 MG tablet Take 1 tablet (2.5 mg total)  by mouth 3 (three) times daily with meals as needed (for MAP <65 and/or SBP <100). 30 tablet 0   . nitroGLYCERIN (NITROSTAT) 0.4 MG SL tablet Place 1 tablet (0.4 mg total) under the tongue every 5 (five) minutes as needed for chest pain. 30 tablet 1   . pantoprazole (PROTONIX) 40 MG tablet Take 1 tablet (40 mg total) by mouth daily. 30 tablet 0   . rivaroxaban (XARELTO) 20 MG TABS tablet Take 1 tablet (20 mg total) by mouth daily with supper. For the blood clot in your heart. 30 tablet 2   . sacubitril-valsartan (ENTRESTO) 49-51 MG Take 1 tablet by mouth 2 (two) times daily. 60 tablet 0   . spironolactone (ALDACTONE) 25 MG tablet Take 1 tablet (25 mg total) by mouth daily. 30 tablet 1   . spironolactone (ALDACTONE) 25 MG tablet Take 1 tablet (25 mg total) by mouth daily.  Hold this medication until you see your cardiologist 30 tablet 1   . thiothixene (NAVANE) 1 MG capsule Take 1 capsule (1 mg total) by mouth 2 (two) times daily. 60 capsule 0     Assessment: Pt on Xarelto 20mg  daily PTA.  ClCr < 50ml/min.  Pt received Lovenox 30mg  SQ x 1 on 10/11 at 2233.  Goal of Therapy:  Monitor platelets by anticoagulation protocol: Yes   Plan:  Xarelto 15mg  po daily (renally adjusted) Monitor for s/sx bleeding complications  Hart Robinsons A 10/20/2019,1:20 AM

## 2019-10-20 NOTE — Progress Notes (Signed)
PT Cancellation Note  Patient Details Name: Katelyn Lane MRN: [id number] DOB: [birth year]   Cancelled Treatment:    Reason Eval/Treat Not Completed: Other (comment):  Pt declined treatment due to fatigue.  Pt will be evaluated at late date if medically cleared.   Gwenlyn Saran, PT, DPT 10/20/19, 2:52 PM

## 2019-10-20 NOTE — Progress Notes (Signed)
PT Cancellation Note  Patient Details Name: Ossie Yebra MRN: [id number] DOB: 1955-06-04   Cancelled Treatment:    Reason Eval/Treat Not Completed: Other (comment). Pt sleeping soundly. Does wake to voice and touch, but quickly returns to sleep. PT to re-attempt as able.   Lieutenant Diego PT, DPT 10:29 AM,10/20/19

## 2019-10-21 DIAGNOSIS — I5041 Acute combined systolic (congestive) and diastolic (congestive) heart failure: Secondary | ICD-10-CM | POA: Diagnosis not present

## 2019-10-21 LAB — BASIC METABOLIC PANEL
Anion gap: 10 (ref 5–15)
BUN: 59 mg/dL — ABNORMAL HIGH (ref 8–23)
CO2: 20 mmol/L — ABNORMAL LOW (ref 22–32)
Calcium: 9 mg/dL (ref 8.9–10.3)
Chloride: 109 mmol/L (ref 98–111)
Creatinine, Ser: 2.89 mg/dL — ABNORMAL HIGH (ref 0.44–1.00)
GFR, Estimated: 17 mL/min — ABNORMAL LOW (ref >60)
Glucose, Bld: 104 mg/dL — ABNORMAL HIGH (ref 70–99)
Potassium: 4.6 mmol/L (ref 3.5–5.1)
Sodium: 139 mmol/L (ref 135–145)

## 2019-10-21 MED ORDER — MELATONIN 5 MG PO TABS
5.0000 mg | ORAL_TABLET | Freq: Every day | ORAL | Status: DC
  Administered 2019-10-21 – 2019-10-23 (×3): 5 mg via ORAL
  Filled 2019-10-21 (×5): qty 1

## 2019-10-21 MED ORDER — DIPHENHYDRAMINE HCL 12.5 MG/5ML PO ELIX
6.2500 mg | ORAL_SOLUTION | Freq: Once | ORAL | Status: AC
  Administered 2019-10-23: 6.25 mg via ORAL
  Filled 2019-10-21: qty 5

## 2019-10-21 MED ORDER — FUROSEMIDE 40 MG PO TABS
40.0000 mg | ORAL_TABLET | Freq: Every day | ORAL | Status: AC
  Administered 2019-10-21: 40 mg via ORAL
  Filled 2019-10-21: qty 1

## 2019-10-21 NOTE — Plan of Care (Signed)

## 2019-10-21 NOTE — Progress Notes (Signed)
  Heart Failure Nurse Navigator Note  HFrEF- 25-30 % by echocardiogram performed in June 2021.  Initial visit with patient.  She was easy to arouse and wanted to talk but would drift off to sleep.  She did say she has been homeless since her last hospitalization.  After 4 attempts to keep her awake, instructed would come back at another time when she is more awake.  Pricilla Riffle RN, CHFN

## 2019-10-21 NOTE — Plan of Care (Signed)
PT A&O. VSS. Pt denies any acute complaints. NO acute changes. IV team attempted to place IV, pt refused further attempts. Provider aware, notified that pt does not have IV access to receive lasix tonight. Will continue to monitor.  Problem: Education: Goal: Knowledge of General Education information will improve Description: Including pain rating scale, medication(s)/side effects and non-pharmacologic comfort measures Outcome: Progressing   Problem: Health Behavior/Discharge Planning: Goal: Ability to manage health-related needs will improve Outcome: Progressing   Problem: Clinical Measurements: Goal: Ability to maintain clinical measurements within normal limits will improve Outcome: Progressing Goal: Will remain free from infection Outcome: Progressing Goal: Diagnostic test results will improve Outcome: Progressing Goal: Respiratory complications will improve Outcome: Progressing Goal: Cardiovascular complication will be avoided Outcome: Progressing   Problem: Activity: Goal: Risk for activity intolerance will decrease Outcome: Progressing   Problem: Nutrition: Goal: Adequate nutrition will be maintained Outcome: Progressing   Problem: Coping: Goal: Level of anxiety will decrease Outcome: Progressing   Problem: Elimination: Goal: Will not experience complications related to bowel motility Outcome: Progressing Goal: Will not experience complications related to urinary retention Outcome: Progressing   Problem: Pain Managment: Goal: General experience of comfort will improve Outcome: Progressing   Problem: Safety: Goal: Ability to remain free from injury will improve Outcome: Progressing   Problem: Skin Integrity: Goal: Risk for impaired skin integrity will decrease Outcome: Progressing   Problem: Education: Goal: Ability to demonstrate management of disease process will improve Outcome: Progressing Goal: Ability to verbalize understanding of medication  therapies will improve Outcome: Progressing Goal: Individualized Educational Video(s) Outcome: Progressing   Problem: Activity: Goal: Capacity to carry out activities will improve Outcome: Progressing   Problem: Cardiac: Goal: Ability to achieve and maintain adequate cardiopulmonary perfusion will improve Outcome: Progressing

## 2019-10-21 NOTE — Progress Notes (Signed)
PT Cancellation Note  Patient Details Name: Katelyn Lane MRN: [id number] DOB: 10-18-1955   Cancelled Treatment:    Reason Eval/Treat Not Completed: Other (Pt sleeping upon PT entry but able to open eyes with verbal stimuli. Pt continues to decline to work with PT, stating "not today." When PT asked why, she states that she doesn't feel well. Pt confirms that she has been up since being in the hospital, but unable to give PT anymore information as she closed her eyes and went back to sleep. Will continue to attempt therapy evaluation tomorrow as appropriate. )  Herminio Commons, PT, DPT 2:19 PM,10/21/19

## 2019-10-21 NOTE — Progress Notes (Signed)
Mobility Specialist - Progress Note   10/21/19 1400  Mobility  Activity Refused mobility  Mobility performed by Mobility specialist    Pt declined mobility this date stating "I don't feel good." Will attempt session at another date/time,   Kathee Delton Mobility Specialist 10/21/19, 2:53 PM

## 2019-10-21 NOTE — Progress Notes (Signed)
PT Cancellation Note  Patient Details Name: Katelyn Lane MRN: [id number] DOB: 20-Jan-1955   Cancelled Treatment:    Reason Eval/Treat Not Completed: Other (Pt sleeping upon entry and very lethargic. Pt able to greet PT, but is currently declining PT stating "not right now, I'm very sick." Will follow up with therapy evaluation later today when pt is more alert.)  Herminio Commons, PT, DPT 9:50 AM,10/21/19

## 2019-10-21 NOTE — Progress Notes (Signed)
PROGRESS NOTE    Katelyn Lane  WIO:[id number] DOB: [birth year] DOA: 10/19/2019 PCP: [name], MD   Brief Narrative: Taken from H&P and chart review. Katelyn Lane is a 64 y.o. 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 Found to have BNP greater than 4500, elevated troponin and AKI.  Multiple prior admissions with similar symptoms and complaints.  Prolonged hospitalization in August 2021 when she was discharged to SNF.  Not clear when she get out of SNF.  Continues to complain that she is homeless but per social worker she do have a house.  Not take her medication despite being provided with medical management.   Subjective: Patient was resting comfortably when I entered the room.  Says "I have lots of problems" but says no specific concerns. Denies chest pain, denies sob. Has appetite, tolerating diet.   Assessment & Plan:   Principal Problem:   Acute CHF (congestive heart failure) ([facility]) Active Problems:   AKI (acute kidney injury) ([facility])   Crack cocaine use   Acute respiratory failure with hypoxia ([facility])   LV (left ventricular) mural thrombus   Cocaine abuse ([place])   Peripheral edema  Acute on chronic combined systolic and diastolic heart failure.  Patient with EF of 25 to 30% and grade 3 diastolic dysfunction on echocardiogram done in June 2021.  Multiple admissions for similar symptoms and being noncompliant.  Continue to use cocaine. Mild trop elevation, likely demand. She appears comfortable. Discontined IV and refuses to have it replaced -continue home lasix 40 mg po qd -Continue to monitor daily weight and BMP. -Strict intake and output - hyolding entresto and bb until bp stable - cardiology following, appreciate  recs  Acute hypoxic respiratory failure - requiring 6 L on admission, o2 off oxygen was 89%, now weaned to 3 L, likely 2/2 above cardiac process - wean as able  Hypotension - hypotensive on admission, improved to low normal with institution of midodrine. - diuresis as above - cont midodrine - holding bb and entresto as above  AKI with CKD stage IIIb.  Creatinine about 3, baseline around 1.6. Mildly improved to 2.89 today. U/s showing medical renal disease. Likely cardiorenal -Continue with diuresis. -nephrology following, appreciate recs -Continue to monitor renal function. -Avoid nephrotoxins.  Cocaine use.  According to H&P last use was 2 to 3 days ago.  UDS positive for cocaine.  Patient refused to answer any questions regarding her cocaine use .  This will complicate overall prognosis and care.  History of ventricular thrombus in 01/2019 and hx of CVA Continue Xarelto  Social issues.  Apparently she was not competent enough to make her own decisions according to the psych evaluation done during recent hospitalization. -social worker has been asked to investigate guardianship process   Objective: Vitals:   10/20/19 1616 10/20/19 1919 10/21/19 0333 10/21/19 0804  BP: 107/84 110/78 (!) 118/97 105/81  Pulse: 72 69 72 73  Resp: 19 18  20   Temp: 97.7 F (36.5 C) 97.8 F (36.6 C) 97.8 F (36.6 C) 98.3 F (36.8 C)  TempSrc:  Oral Oral   SpO2: 100% 100% 99% 100%  Weight:   114.1 kg   Height:        Intake/Output Summary (Last 24 hours) at 10/21/2019 1310 Last data filed at 10/21/2019 1009 Gross per  24 hour  Intake 600 ml  Output 300 ml  Net 300 ml   Filed Weights   10/20/19 0043 10/20/19 0554 10/21/19 0333  Weight: 120.5 kg 112.9 kg 114.1 kg    Examination:  General exam: Appears calm and comfortable  Respiratory system: Clear to auscultation. Respiratory effort normal. Cardiovascular system: S1 & S2 heard, RRR. Soft systolic murmur. Distant heart  sounds Gastrointestinal system: Soft, nontender, obese, bowel sounds positive. Central nervous system: Alert and oriented. No focal neurological deficits. Extremities: 1+ LE edema, no cyanosis, pulses intact and symmetrical. Psychiatry: Judgement and insight appear impaired.   DVT prophylaxis: Xarelto Code Status: Full Family Communication: none at bedside Disposition Plan:  Status is: Inpatient  Remains inpatient appropriate because:Inpatient level of care appropriate due to severity of illness   Dispo: The patient is from: Home/homeless              Anticipated d/c is to: To be determined              Anticipated d/c date is: 2 days              Patient currently is not medically stable to d/c.   Consultants:   Nephrology, cardiology  Procedures: renal u/s Antimicrobials: none  Data Reviewed: I have personally reviewed following labs and imaging studies  CBC: Recent Labs  Lab 10/19/19 0935  WBC 5.7  HGB 12.2  HCT 38.5  MCV 75.2*  PLT 502   Basic Metabolic Panel: Recent Labs  Lab 10/19/19 0935 10/20/19 0356 10/21/19 0553  NA 139 142 139  K 4.4 4.7 4.6  CL 108 110 109  CO2 17* 23 20*  GLUCOSE 66* 118* 104*  BUN 55* 56* 59*  CREATININE 3.07* 3.14* 2.89*  CALCIUM 9.5 9.4 9.0   GFR: Estimated Creatinine Clearance: 25.5 mL/min (A) (by C-G formula based on SCr of 2.89 mg/dL (H)). Liver Function Tests: No results for input(s): AST, ALT, ALKPHOS, BILITOT, PROT, ALBUMIN in the last 168 hours. No results for input(s): LIPASE, AMYLASE in the last 168 hours. No results for input(s): AMMONIA in the last 168 hours. Coagulation Profile: No results for input(s): INR, PROTIME in the last 168 hours. Cardiac Enzymes: No results for input(s): CKTOTAL, CKMB, CKMBINDEX, TROPONINI in the last 168 hours. BNP (last 3 results) No results for input(s): PROBNP in the last 8760 hours. HbA1C: No results for input(s): HGBA1C in the last 72 hours. CBG: No results for input(s):  GLUCAP in the last 168 hours. Lipid Profile: No results for input(s): CHOL, HDL, LDLCALC, TRIG, CHOLHDL, LDLDIRECT in the last 72 hours. Thyroid Function Tests: No results for input(s): TSH, T4TOTAL, FREET4, T3FREE, THYROIDAB in the last 72 hours. Anemia Panel: No results for input(s): VITAMINB12, FOLATE, FERRITIN, TIBC, IRON, RETICCTPCT in the last 72 hours. Sepsis Labs: No results for input(s): PROCALCITON, LATICACIDVEN in the last 168 hours.  Recent Results (from the past 240 hour(s))  Respiratory Panel by RT PCR (Flu A&B, Covid) - Nasopharyngeal Swab     Status: None   Collection Time: 10/19/19 11:13 PM   Specimen: Nasopharyngeal Swab  Result Value Ref Range Status   SARS Coronavirus 2 by RT PCR NEGATIVE NEGATIVE Final    Comment: (NOTE) SARS-CoV-2 target nucleic acids are NOT DETECTED.  The SARS-CoV-2 RNA is generally detectable in upper respiratoy specimens during the acute phase of infection. The lowest concentration of SARS-CoV-2 viral copies this assay can detect is 131 copies/mL. A negative result does not preclude SARS-Cov-2 infection and  should not be used as the sole basis for treatment or other patient management decisions. A negative result may occur with  improper specimen collection/handling, submission of specimen other than nasopharyngeal swab, presence of viral mutation(s) within the areas targeted by this assay, and inadequate number of viral copies (<131 copies/mL). A negative result must be combined with clinical observations, patient history, and epidemiological information. The expected result is Negative.  Fact Sheet for Patients:  PinkCheek.be  Fact Sheet for Healthcare Providers:  GravelBags.it  This test is no t yet approved or cleared by the [place] FDA and  has been authorized for detection and/or diagnosis of SARS-CoV-2 by FDA under an Emergency Use Authorization (EUA). This EUA will  remain  in effect (meaning this test can be used) for the duration of the COVID-19 declaration under Section 564(b)(1) of the Act, 21 U.S.C. section 360bbb-3(b)(1), unless the authorization is terminated or revoked sooner.     Influenza A by PCR NEGATIVE NEGATIVE Final   Influenza B by PCR NEGATIVE NEGATIVE Final    Comment: (NOTE) The Xpert Xpress SARS-CoV-2/FLU/RSV assay is intended as an aid in  the diagnosis of influenza from Nasopharyngeal swab specimens and  should not be used as a sole basis for treatment. Nasal washings and  aspirates are unacceptable for Xpert Xpress SARS-CoV-2/FLU/RSV  testing.  Fact Sheet for Patients: PinkCheek.be  Fact Sheet for Healthcare Providers: GravelBags.it  This test is not yet approved or cleared by the [place] FDA and  has been authorized for detection and/or diagnosis of SARS-CoV-2 by  FDA under an Emergency Use Authorization (EUA). This EUA will remain  in effect (meaning this test can be used) for the duration of the  Covid-19 declaration under Section 564(b)(1) of the Act, 21  U.S.C. section 360bbb-3(b)(1), unless the authorization is  terminated or revoked. Performed at [facility], [address] [place], [place] [address]      Radiology Studies: US RENAL  Result Date: 10/20/2019 CLINICAL DATA:  64 year old female with acute renal failure. EXAM: RENAL / URINARY TRACT ULTRASOUND COMPLETE COMPARISON:  CT abdomen pelvis dated 08/15/2019. FINDINGS: Right Kidney: Renal measurements: 10.9 x 4.6 x 4.9 cm = volume: 127 mL. There is mild diffuse increased echogenicity. No hydronephrosis or shadowing stone. There is a 1.7 x 1.1 x 1.1 cm lateral interpolar cyst. Left Kidney: Renal measurements: 10.1 x 4.8 x 5.3 cm = volume: 132 mL. Mild increased echogenicity. No hydronephrosis or shadowing stone. Bladder: The bladder is partially visualized. Other: There is a small  ascites and anasarca. IMPRESSION: 1. Increased renal parenchymal echogenicity in keeping with chronic kidney disease. No hydronephrosis or shadowing stone. 2. Small ascites. Electronically Signed   By: Anner Crete M.D.   On: 10/20/2019 17:59    Scheduled Meds: . diphenhydrAMINE  6.25 mg Oral Once  . furosemide  40 mg Intravenous Q12H  . mouth rinse  15 mL Mouth Rinse BID  . melatonin  5 mg Oral QHS  . midodrine  5 mg Oral TID WC  . rivaroxaban  15 mg Oral Daily  . sodium chloride flush  3 mL Intravenous Q12H   Continuous Infusions: . sodium chloride       LOS: 2 days   Time spent: 30 minutes.  I personally reviewed her chart.  Desma Maxim, MD [facility]  If 7PM-7AM, please contact night-coverage [email or web address]  10/21/2019, 1:10 PM

## 2019-10-21 NOTE — Progress Notes (Signed)
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   SUBJECTIVE: The patient reports to be doing well on today. She denies any chest pain or dyspnea at this time.   OBJECTIVE: The patient is sleeping at this time and did not want to answer many questions. She continues to appear hypervolemic and is on supplement O2 via nasal cannula. Overnight, the patient's IV infiltrated and upon unsuccessful attempts, the patient refused IV placement; therefore, her dose of IV lasix was not given on last night. The patient is in bed with rails up, vss and she was not on telemetry monitoring.   Vitals:   10/20/19 1616 10/20/19 1919 10/21/19 0333 10/21/19 0804  BP: 107/84 110/78 (!) 118/97 105/81  Pulse: 72 69 72 73  Resp: 19 18  20   Temp: 97.7 F (36.5 C) 97.8 F (36.6 C) 97.8 F (36.6 C) 98.3 F (36.8 C)  TempSrc:  Oral Oral   SpO2: 100% 100% 99% 100%  Weight:   114.1 kg   Height:         Intake/Output Summary (Last 24 hours) at 10/21/2019 0856 Last data filed at 10/21/2019 0335 Gross per 24 hour  Intake 480 ml  Output 300 ml  Net 180 ml      PHYSICAL EXAM  General: Appears disheveled, well developed, well nourished, in no acute distress HEENT: PERRLA. Glossy sclera.  Normocephalic and atraumatic, no JVD, supple +2 carotid  Lungs: expiratory wheezes bilaterally upon auscultation. Chest expansion symmetrical.  Heart: HRRR . Normal S1 and S2 without gallops or murmurs.  Abdomen: Bowel sounds are positive, abdomen soft and non-tender.  Msk: Normal strength and tone for age. Extremities: moderate +2 BLE edema.  No clubbing or cyanosis.  Neuro: Alert and oriented X 3. Psych: responds appropriately   LABS: Basic Metabolic Panel: Recent  Labs    10/20/19 0356 10/21/19 0553  NA 142 139  K 4.7 4.6  CL 110 109  CO2 23 20*  GLUCOSE 118* 104*  BUN 56* 59*  CREATININE 3.14* 2.89*  CALCIUM 9.4 9.0   Liver Function Tests: No results for input(s): AST, ALT, ALKPHOS, BILITOT, PROT, ALBUMIN in the last 72 hours. No results for input(s): LIPASE, AMYLASE in the last 72 hours. CBC: Recent Labs    10/19/19 0935  WBC 5.7  HGB 12.2  HCT 38.5  MCV 75.2*  PLT 331   Cardiac Enzymes: No results for input(s): CKTOTAL, CKMB, CKMBINDEX, TROPONINI in the last 72 hours. BNP: Invalid input(s): POCBNP D-Dimer: No results for input(s): DDIMER in the last 72 hours. Hemoglobin A1C: No results for input(s): HGBA1C in the last 72 hours. Fasting Lipid Panel: No results for input(s): CHOL, HDL, LDLCALC, TRIG, CHOLHDL, LDLDIRECT in the last 72 hours. Thyroid Function Tests: No results for input(s): TSH, T4TOTAL, T3FREE, THYROIDAB in the last 72 hours.  Invalid input(s): FREET3 Anemia Panel: No results for input(s): VITAMINB12, FOLATE, FERRITIN, TIBC, IRON, RETICCTPCT in the last 72 hours.  DG Chest 2 View  Result Date: 10/19/2019 CLINICAL DATA:  Shortness of breath. EXAM: CHEST - 2 VIEW COMPARISON:  08/27/2019 and chest CT 08/15/2019 FINDINGS: Stable cardiomegaly. No overt pulmonary edema. Difficult to exclude densities at the left lung base and small left pleural effusion. Negative for a pneumothorax. IMPRESSION: Stable cardiomegaly  with probable left basilar chest densities. Difficult to exclude a small left pleural effusion. Minimal change from the exam on 08/27/2019. Electronically Signed   By: Markus Daft M.D.   On: 10/19/2019 09:42   US RENAL  Result Date: 10/20/2019 CLINICAL DATA:  64 year old female with acute renal failure. EXAM: RENAL / URINARY TRACT ULTRASOUND COMPLETE COMPARISON:  CT abdomen pelvis dated 08/15/2019. FINDINGS: Right Kidney: Renal measurements: 10.9 x 4.6 x 4.9 cm = volume: 127 mL. There is mild diffuse  increased echogenicity. No hydronephrosis or shadowing stone. There is a 1.7 x 1.1 x 1.1 cm lateral interpolar cyst. Left Kidney: Renal measurements: 10.1 x 4.8 x 5.3 cm = volume: 132 mL. Mild increased echogenicity. No hydronephrosis or shadowing stone. Bladder: The bladder is partially visualized. Other: There is a small ascites and anasarca. IMPRESSION: 1. Increased renal parenchymal echogenicity in keeping with chronic kidney disease. No hydronephrosis or shadowing stone. 2. Small ascites. Electronically Signed   By: Anner Crete M.D.   On: 10/20/2019 17:59     Echo from 06/2019: IMPRESSIONS  1. Left ventricular ejection fraction, by estimation, is 25 to 30%. The  left ventricle has severely decreased function. The left ventricle  demonstrates global hypokinesis. There is mild concentric left ventricular  hypertrophy. Left ventricular diastolic  parameters are consistent with Grade III diastolic dysfunction  (restrictive).  2. Right ventricular systolic function is normal. The right ventricular  size is severely enlarged. There is moderately elevated pulmonary artery  systolic pressure.  3. Left atrial size was severely dilated.  4. Right atrial size was severely dilated.  5. The mitral valve is normal in structure. Mild mitral valve  regurgitation. No evidence of mitral stenosis.  6. The aortic valve is normal in structure. Aortic valve regurgitation is  not visualized. No aortic stenosis is present.  7. The inferior vena cava is normal in size with greater than 50%  respiratory variability, suggesting right atrial pressure of 3 mmHg.   Conclusion(s)/Recommendation(s): Findings consistent with non-ischemic  cardiomyopathy.    TELEMETRY: pending; order placed.   ASSESSMENT AND PLAN:  Principal Problem:   Acute CHF (congestive heart failure) (HCC) Active Problems:   AKI (acute kidney injury) ([place])   Crack cocaine use   Acute respiratory failure with hypoxia (HCC)    LV (left ventricular) mural thrombus   Cocaine abuse ([place])   Peripheral edema    ASSESSMENT AND PLAN:  1. Acute on Chronic CHF exacerbation (EF =25-30% on Echo), uncompensated, fairly stable at this time             -BNP >4500             -High Sensitivity troponin=106>>156; likely due to demand ischemia.              -Switch to oral Lasix until IV access can be established.              -Hold Entresto at this time due to hypotension and acute on chronic CKD stage IIIb.             -Trend BMP.              -Hold beta blocker until blood pressure is stabilized.             -1500cc FS.             -Strict I& O's             -Daily weights             -  continuous telemetry monitoring             -low sodium, heart healthy diet  2. Acute hypoxic respiratory failure, stable              -Continue HF management as stated above.             -Agree with supplemental oxygen therapy. Wean as tolerated.   3. Acute on Chronic CKD Stage IIIb, fairly stable             -Nephrology input appreciated.             -We will hold Entresto at this time.              -Monitor BUN/Creatinine  4. H/o Ventricular thrombus, stable             -patient on xarelto; continue.   -follow bleeding precautions.   5. Hypotension, stable, patient is normotensive at this time             -continue midodrine.              -monitor bp q4h.              -hold antihypertensives at this time.   6. HLD, stable             -lipid profile from 05/22/19 is within reference range with a total cholesterol=100, HDL=46, LDL=43 and Triglycerides=56             -continue diet management.               5. Cocaine use     -Recommend phychiatric and/or chaplain evaluation for drug abuse.              -Recommend cocaine cessation.             -Recommend social work consult for sociological issues (homelessness).             [place], ACNPC-AG  10/21/2019 8:56 AM

## 2019-10-21 NOTE — Progress Notes (Signed)
[facility]  ROUNDING NOTE   Subjective:   Katelyn Lane admitted on 10/19/2019 for Peripheral edema [R60.9] Acute CHF (congestive heart failure) ([place]) [I50.9] AKI (acute kidney injury) ([place]) [N17.9] Congestive heart failure, unspecified HF chronicity, unspecified heart failure type ([place]) [I50.9]  Patient appears in less distress today, her O2 requirement down to 2L via nasal cannula.She is sitting up in bed, reporting as feeling better.  Objective:  Vital signs in last 24 hours:  Temp:  [97.7 F (36.5 C)-98.3 F (36.8 C)] 98.3 F (36.8 C) (10/13 0804) Pulse Rate:  [69-73] 73 (10/13 0804) Resp:  [18-20] 20 (10/13 0804) BP: (105-118)/(78-97) 105/81 (10/13 0804) SpO2:  [99 %-100 %] 100 % (10/13 0804) Weight:  [114.1 kg] 114.1 kg (10/13 0333)  Weight change: 17.1 kg Filed Weights   10/20/19 0043 10/20/19 0554 10/21/19 0333  Weight: 120.5 kg 112.9 kg 114.1 kg    Intake/Output: I/O last 3 completed shifts: In: 57 [P.O.:720] Out: 2300 [Urine:2300]   Intake/Output this shift:  Total I/O In: 120 [P.O.:120] Out: -   Physical Exam: General: In no acute distress  Head: Normocephalic,atraumatic  Eyes: Sclerae and conjunctivae clear  Neck: Supple, trachea midline  Lungs:  Respirations even,Accessory muscle use +,Bilateral rhonchi +  Heart: S1S2 Regular rate and rhythm  Abdomen:  Soft, nontender, non distended  Extremities: 2+ peripheral edema,tight  Neurologic: Awake,alert, oriented  Skin: No lesions or rashes    Basic Metabolic Panel: Recent Labs  Lab 10/19/19 0935 10/20/19 0356 10/21/19 0553  NA 139 142 139  K 4.4 4.7 4.6  CL 108 110 109  CO2 17* 23 20*  GLUCOSE 66* 118* 104*  BUN 55* 56* 59*  CREATININE 3.07* 3.14* 2.89*  CALCIUM 9.5 9.4 9.0    Liver Function Tests: No results for input(s): AST, ALT, ALKPHOS, BILITOT, PROT, ALBUMIN in the last 168 hours. No results for input(s): LIPASE, AMYLASE in the last 168 hours. No results for  input(s): AMMONIA in the last 168 hours.  CBC: Recent Labs  Lab 10/19/19 0935  WBC 5.7  HGB 12.2  HCT 38.5  MCV 75.2*  PLT 331    Cardiac Enzymes: No results for input(s): CKTOTAL, CKMB, CKMBINDEX, TROPONINI in the last 168 hours.  BNP: Invalid input(s): POCBNP  CBG: No results for input(s): GLUCAP in the last 168 hours.  Microbiology: Results for orders placed or performed during the hospital encounter of 10/19/19  Respiratory Panel by RT PCR (Flu A&B, Covid) - Nasopharyngeal Swab     Status: None   Collection Time: 10/19/19 11:13 PM   Specimen: Nasopharyngeal Swab  Result Value Ref Range Status   SARS Coronavirus 2 by RT PCR NEGATIVE NEGATIVE Final    Comment: (NOTE) SARS-CoV-2 target nucleic acids are NOT DETECTED.  The SARS-CoV-2 RNA is generally detectable in upper respiratoy specimens during the acute phase of infection. The lowest concentration of SARS-CoV-2 viral copies this assay can detect is 131 copies/mL. A negative result does not preclude SARS-Cov-2 infection and should not be used as the sole basis for treatment or other patient management decisions. A negative result may occur with  improper specimen collection/handling, submission of specimen other than nasopharyngeal swab, presence of viral mutation(s) within the areas targeted by this assay, and inadequate number of viral copies (<131 copies/mL). A negative result must be combined with clinical observations, patient history, and epidemiological information. The expected result is Negative.  Fact Sheet for Patients:  PinkCheek.be  Fact Sheet for Healthcare Providers:  GravelBags.it  This test is no t yet approved or cleared by the [place] and  has been authorized for detection and/or diagnosis of SARS-CoV-2 by FDA under an Emergency Use Authorization (EUA). This EUA will remain  in effect (meaning this test can be used) for the  duration of the COVID-19 declaration under Section 564(b)(1) of the Act, 21 U.S.C. section 360bbb-3(b)(1), unless the authorization is terminated or revoked sooner.     Influenza A by PCR NEGATIVE NEGATIVE Final   Influenza B by PCR NEGATIVE NEGATIVE Final    Comment: (NOTE) The Xpert Xpress SARS-CoV-2/FLU/RSV assay is intended as an aid in  the diagnosis of influenza from Nasopharyngeal swab specimens and  should not be used as a sole basis for treatment. Nasal washings and  aspirates are unacceptable for Xpert Xpress SARS-CoV-2/FLU/RSV  testing.  Fact Sheet for Patients: PinkCheek.be  Fact Sheet for Healthcare Providers: GravelBags.it  This test is not yet approved or cleared by the [place] FDA and  has been authorized for detection and/or diagnosis of SARS-CoV-2 by  FDA under an Emergency Use Authorization (EUA). This EUA will remain  in effect (meaning this test can be used) for the duration of the  Covid-19 declaration under Section 564(b)(1) of the Act, 21  U.S.C. section 360bbb-3(b)(1), unless the authorization is  terminated or revoked. Performed at [facility], [address] [place], [place] [address]     Coagulation Studies: No results for input(s): LABPROT, INR in the last 72 hours.  Urinalysis: No results for input(s): COLORURINE, LABSPEC, PHURINE, GLUCOSEU, HGBUR, BILIRUBINUR, KETONESUR, PROTEINUR, UROBILINOGEN, NITRITE, LEUKOCYTESUR in the last 72 hours.  Invalid input(s): APPERANCEUR    Imaging: US RENAL  Result Date: 10/20/2019 CLINICAL DATA:  64 year old female with acute renal failure. EXAM: RENAL / URINARY TRACT ULTRASOUND COMPLETE COMPARISON:  CT abdomen pelvis dated 08/15/2019. FINDINGS: Right Kidney: Renal measurements: 10.9 x 4.6 x 4.9 cm = volume: 127 mL. There is mild diffuse increased echogenicity. No hydronephrosis or shadowing stone. There is a 1.7 x 1.1 x 1.1 cm  lateral interpolar cyst. Left Kidney: Renal measurements: 10.1 x 4.8 x 5.3 cm = volume: 132 mL. Mild increased echogenicity. No hydronephrosis or shadowing stone. Bladder: The bladder is partially visualized. Other: There is a small ascites and anasarca. IMPRESSION: 1. Increased renal parenchymal echogenicity in keeping with chronic kidney disease. No hydronephrosis or shadowing stone. 2. Small ascites. Electronically Signed   By: Anner Crete M.D.   On: 10/20/2019 17:59     Medications:   . sodium chloride     . diphenhydrAMINE  6.25 mg Oral Once  . furosemide  40 mg Intravenous Q12H  . mouth rinse  15 mL Mouth Rinse BID  . melatonin  5 mg Oral QHS  . midodrine  5 mg Oral TID WC  . rivaroxaban  15 mg Oral Daily  . sodium chloride flush  3 mL Intravenous Q12H   sodium chloride, acetaminophen, sodium chloride flush  Assessment/ Plan:  Katelyn Lane is a 64 y.o. black female with congestive heart failure, hypertension, hyperlipidemia, gout, COPD, anemia, cocaine abuse who is admitted to [facility] on 10/19/2019 for Peripheral edema [R60.9] Acute CHF (congestive heart failure) ([place]) [I50.9] AKI (acute kidney injury) ([place]) [N17.9] Congestive heart failure, unspecified HF chronicity, unspecified heart failure type ([place]) [I50.9]  1. Acute kidney injury on chronic kidney disease stage IIIB. Baseline creatinine of 1.66, GFR of 32. With history of proteinuria.  Chronic kidney disease secondary to hypertension  Acute renal  failure secondary to acute cardiorenal syndrome - Holding Entresto -Continue Furosemide 40 mg q12 hours  -Renal US ON 10/20/19 IMPRESSION: 1. Increased renal parenchymal echogenicity in keeping with chronic kidney disease. No hydronephrosis or shadowing stone. 2. Small ascites.  2. Hypotension with acute exacerbation of systolic and diastolic congestive heart failure. Echocardiogram 06/22/19 EF of 25-30%.  - IV furosemide - Continue Midodrine 5 mg TID - Cardiology  involved in care     LOS: 2 Katelyn Lane 10/13/202111:39 AM

## 2019-10-22 DIAGNOSIS — I5041 Acute combined systolic (congestive) and diastolic (congestive) heart failure: Secondary | ICD-10-CM | POA: Diagnosis not present

## 2019-10-22 LAB — BASIC METABOLIC PANEL
Anion gap: 9 (ref 5–15)
BUN: 56 mg/dL — ABNORMAL HIGH (ref 8–23)
CO2: 21 mmol/L — ABNORMAL LOW (ref 22–32)
Calcium: 8.7 mg/dL — ABNORMAL LOW (ref 8.9–10.3)
Chloride: 108 mmol/L (ref 98–111)
Creatinine, Ser: 2.66 mg/dL — ABNORMAL HIGH (ref 0.44–1.00)
GFR, Estimated: 18 mL/min — ABNORMAL LOW (ref >60)
Glucose, Bld: 92 mg/dL (ref 70–99)
Potassium: 4 mmol/L (ref 3.5–5.1)
Sodium: 138 mmol/L (ref 135–145)

## 2019-10-22 MED ORDER — FUROSEMIDE 40 MG PO TABS
40.0000 mg | ORAL_TABLET | Freq: Every day | ORAL | Status: DC
  Administered 2019-10-23: 40 mg via ORAL
  Filled 2019-10-22: qty 1

## 2019-10-22 NOTE — Progress Notes (Signed)
[facility] Cardiology  Patient Description: 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   SUBJECTIVE: The patient reports to be doing well on today and slept well on last night. She denies any chest pain or dyspnea at this time. She continues to c/o BLE pain.   (10/21/19) -The patient reports to be doing well on today. She denies any chest pain or dyspnea at this time.   OBJECTIVE:  The patient appears drowsy on today and again did not want to answer many questions. She appears slightly better than yesterday, as her BLE edema has improved some and her dyspnea appears to have improved also. The patient is resting comfortably in bed and continues to utilize supplemental oxygen via [place]. She refuses to have telemetry monitoring at this time.   (10/21/19) -The patient is sleeping at this time and did not want to answer many questions. She continues to appear hypervolemic and is on supplement O2 via nasal cannula. Overnight, the patient's IV infiltrated and upon unsuccessful attempts, the patient refused IV placement; therefore, her dose of IV lasix was not given on last night. The patient is in bed with rails up, vss and she was not on telemetry monitoring.   Vitals:   10/21/19 1724 10/21/19 1920 10/22/19 0255 10/22/19 0302  BP: (!) 123/96 (!) 114/93 (!) 129/98   Pulse: 71 79 76   Resp:      Temp:  98 F (36.7 C) 97.8 F (36.6 C)   TempSrc:  Oral Oral   SpO2:  99% 100%   Weight:    114.6 kg  Height:         Intake/Output Summary (Last 24 hours) at 10/22/2019 0756 Last data filed at 10/22/2019 0630 Gross per 24 hour  Intake 360 ml  Output 1050 ml  Net -690 ml      PHYSICAL EXAM  General: Appears disheveled, well developed, well nourished, in no acute distress HEENT:  PERRLA. Glossy sclera.  Normocephalic and atraumatic, no JVD, supple. +2 carotid  Lungs: clear bilaterally upon auscultation. Chest expansion symmetrical.  Heart: HRRR . Normal S1 and S2 without gallops or murmurs.  Abdomen: Bowel sounds are positive, abdomen soft and non-tender.  Msk: Normal strength and tone for age. Extremities: moderate BLE edema.  No clubbing or cyanosis.  Neuro: Alert and oriented X 3. Psych: responds appropriately   LABS: Basic Metabolic Panel: Recent Labs    10/21/19 0553 10/22/19 0518  NA 139 138  K 4.6 4.0  CL 109 108  CO2 20* 21*  GLUCOSE 104* 92  BUN 59* 56*  CREATININE 2.89* 2.66*  CALCIUM 9.0 8.7*   Liver Function Tests: No results for input(s): AST, ALT, ALKPHOS, BILITOT, PROT, ALBUMIN in the last 72 hours. No results for input(s): LIPASE, AMYLASE in the last 72 hours. CBC: Recent Labs    10/19/19 0935  WBC 5.7  HGB 12.2  HCT 38.5  MCV 75.2*  PLT 331   Cardiac Enzymes: No results for input(s): CKTOTAL, CKMB, CKMBINDEX, TROPONINI in the last 72 hours. BNP: Invalid input(s): POCBNP D-Dimer: No results for input(s): DDIMER in the last 72 hours. Hemoglobin A1C: No results for input(s): HGBA1C in the last 72 hours. Fasting Lipid Panel: No results for input(s): CHOL, HDL, LDLCALC, TRIG, CHOLHDL, LDLDIRECT in the last 72 hours.  Thyroid Function Tests: No results for input(s): TSH, T4TOTAL, T3FREE, THYROIDAB in the last 72 hours.  Invalid input(s): FREET3 Anemia Panel: No results for input(s): VITAMINB12, FOLATE, FERRITIN, TIBC, IRON, RETICCTPCT in the last 72 hours.  US RENAL  Result Date: 10/20/2019 CLINICAL DATA:  64 year old female with acute renal failure. EXAM: RENAL / URINARY TRACT ULTRASOUND COMPLETE COMPARISON:  CT abdomen pelvis dated 08/15/2019. FINDINGS: Right Kidney: Renal measurements: 10.9 x 4.6 x 4.9 cm = volume: 127 mL. There is mild diffuse increased echogenicity. No hydronephrosis or shadowing stone. There is a 1.7 x  1.1 x 1.1 cm lateral interpolar cyst. Left Kidney: Renal measurements: 10.1 x 4.8 x 5.3 cm = volume: 132 mL. Mild increased echogenicity. No hydronephrosis or shadowing stone. Bladder: The bladder is partially visualized. Other: There is a small ascites and anasarca. IMPRESSION: 1. Increased renal parenchymal echogenicity in keeping with chronic kidney disease. No hydronephrosis or shadowing stone. 2. Small ascites. Electronically Signed   By: Anner Crete M.D.   On: 10/20/2019 17:59     Echo from 06/2019: IMPRESSIONS  1. Left ventricular ejection fraction, by estimation, is 25 to 30%. The  left ventricle has severely decreased function. The left ventricle  demonstrates global hypokinesis. There is mild concentric left ventricular  hypertrophy. Left ventricular diastolic  parameters are consistent with Grade III diastolic dysfunction  (restrictive).  2. Right ventricular systolic function is normal. The right ventricular  size is severely enlarged. There is moderately elevated pulmonary artery  systolic pressure.  3. Left atrial size was severely dilated.  4. Right atrial size was severely dilated.  5. The mitral valve is normal in structure. Mild mitral valve  regurgitation. No evidence of mitral stenosis.  6. The aortic valve is normal in structure. Aortic valve regurgitation is  not visualized. No aortic stenosis is present.  7. The inferior vena cava is normal in size with greater than 50%  respiratory variability, suggesting right atrial pressure of 3 mmHg.   Conclusion(s)/Recommendation(s): Findings consistent with non-ischemic  cardiomyopathy.    TELEMETRY: patient refuses monitoring   ASSESSMENT AND PLAN:  Principal Problem:   Acute CHF (congestive heart failure) (HCC) Active Problems:   AKI (acute kidney injury) ([place])   Crack cocaine use   Acute respiratory failure with hypoxia (HCC)   LV (left ventricular) mural thrombus   Cocaine abuse ([place])   Peripheral  edema    ASSESSMENT AND PLAN:  1. Acute on Chronic CHF exacerbation (EF =25-30% on Echo), uncompensated, fairly stable at this time             -BNP >4500             -High Sensitivity troponin=106>>156; likely due to demand ischemia.              -Continue oral Lasicx 40mg  once daily. (Nephrology in agreement)             -Hold Entresto at this time due to hypotension and acute on chronic CKD stage IIIb. Consider restarting as outpatient.              -Trend BMP.              -Hold beta blocker until blood pressure is stabilized. Consider restarting as outpatient.              -1500cc FS.             -Strict I& O's             -  Daily weights             - Recommend continuous telemetry monitoring but patient refuses              -low sodium, heart healthy diet  2. Acute hypoxic respiratory failure, stable              -Continue HF management as stated above.             -Agree with supplemental oxygen therapy. Wean as tolerated.   3. Acute on Chronic CKD Stage IIIb, fairly stable             -Nephrology input appreciated.             -We will hold Entresto at this time.              -Monitor BUN/Creatinine which appears to be improving some  4. H/o Ventricular thrombus, stable             -patient on xarelto; continue.   -follow bleeding precautions.   5. Hypotension, stable, patient is normotensive at this time             -continue midodrine.              -monitor bp q4h.              -hold antihypertensives at this time.   6. HLD, stable             -lipid profile from 05/22/19 is within reference range with a total cholesterol=100, HDL=46, LDL=43 and Triglycerides=56             -continue diet management.               5. Cocaine use     -Recommend phychiatric and/or chaplain evaluation for drug abuse.              -Recommend cocaine cessation.             -Recommend social work consult for sociological issues (homelessness).             [place], ACNPC-AG   10/22/2019 7:56 AM

## 2019-10-22 NOTE — Plan of Care (Signed)
  Problem: Health Behavior/Discharge Planning: Goal: Ability to manage health-related needs will improve Outcome: Progressing   Problem: Clinical Measurements: Goal: Diagnostic test results will improve Outcome: Progressing   Problem: Clinical Measurements: Goal: Ability to maintain clinical measurements within normal limits will improve Outcome: Progressing   Problem: Clinical Measurements: Goal: Cardiovascular complication will be avoided Outcome: Progressing

## 2019-10-22 NOTE — Progress Notes (Signed)
PROGRESS NOTE    Katelyn Lane  TWS:[id number] DOB: August 25, 1955 DOA: 10/19/2019 PCP: [name], MD   Brief Narrative: Taken from H&P and chart review. Katelyn Lane is a 64 y.o. 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 Found to have BNP greater than 4500, elevated troponin and AKI.  Multiple prior admissions with similar symptoms and complaints.  Prolonged hospitalization in August 2021 when she was discharged to SNF.  Not clear when she get out of SNF.  Continues to complain that she is homeless but per social worker she do have a house.  Not take her medication despite being provided with medical management.   Subjective: Pt frustrated about frequent interruptions. Denies chest pain or shortness of breath  Assessment & Plan:   Principal Problem:   Acute CHF (congestive heart failure) (HCC) Active Problems:   AKI (acute kidney injury) ([place])   Crack cocaine use   Acute respiratory failure with hypoxia (HCC)   LV (left ventricular) mural thrombus   Cocaine abuse (HCC)   Peripheral edema  Acute on chronic combined systolic and diastolic heart failure.  Patient with EF of 25 to 30% and grade 3 diastolic dysfunction on echocardiogram done in June 2021.  Multiple admissions for similar symptoms and being noncompliant.  Continue to use cocaine. Mild trop elevation, likely demand. She appears comfortable.  -continue home lasix 40 mg po qd -Continue to monitor daily weight and BMP. -Strict intake and output - hyolding entresto and bb until bp stable - cardiology following, appreciate recs  Acute hypoxic respiratory failure - requiring 6 L on admission, o2 off oxygen was 89%, now weaned to 2 L, likely 2/2 above cardiac process -  wean as able  Hypotension - resolved - diuresis as above - stoop midodrine - holding bb and entresto as above  AKI with CKD stage IIIb.  Creatinine about 3, baseline around 2. Mildly improved to 2.66 today. U/s showing medical renal disease. Likely cardiorenal -Continue with diuresis. -nephrology following, appreciate recs -Continue to monitor renal function. -Avoid nephrotoxins.  Cocaine use.  According to H&P last use was 2 to 3 days ago.  UDS positive for cocaine.  Patient refused to answer any questions regarding her cocaine use .  This will complicate overall prognosis and care.  History of ventricular thrombus in 01/2019 and hx of CVA Continue Xarelto  Social issues.  Apparently she was not competent enough to make her own decisions according to the psych evaluation done during recent hospitalization. -will involve psychiatry   Objective: Vitals:   10/21/19 1724 10/21/19 1920 10/22/19 0255 10/22/19 0302  BP: (!) 123/96 (!) 114/93 (!) 129/98   Pulse: 71 79 76   Resp:      Temp:  98 F (36.7 C) 97.8 F (36.6 C)   TempSrc:  Oral Oral   SpO2:  99% 100%   Weight:    114.6 kg  Height:        Intake/Output Summary (Last 24 hours) at 10/22/2019 1121 Last data filed at 10/22/2019 0630 Gross per 24 hour  Intake 240 ml  Output 1050 ml  Net -810 ml   Filed Weights   10/20/19 0554 10/21/19 0333 10/22/19 0302  Weight: 112.9 kg 114.1 kg 114.6 kg    Examination:  General exam: Appears calm and comfortable  Respiratory system: Clear to auscultation. Respiratory effort normal. Cardiovascular system: S1 & S2 heard, RRR. Soft systolic murmur. Distant heart sounds Gastrointestinal system: Soft, nontender, obese, bowel sounds positive. Central nervous system: Alert and oriented. No focal neurological deficits. Extremities: 1+ LE edema, no cyanosis, pulses intact and symmetrical. Psychiatry: Judgement and insight appear impaired.   DVT prophylaxis: Xarelto Code Status:  Full Family Communication: none at bedside Disposition Plan:  Status is: Inpatient  Remains inpatient appropriate because:Inpatient level of care appropriate due to severity of illness   Dispo: The patient is from: Home/homeless              Anticipated d/c is to: To be determined              Anticipated d/c date is: 2 days              Patient currently is not medically stable to d/c.   Consultants:   Nephrology, cardiology  Procedures: renal u/s Antimicrobials: none  Data Reviewed: I have personally reviewed following labs and imaging studies  CBC: Recent Labs  Lab 10/19/19 0935  WBC 5.7  HGB 12.2  HCT 38.5  MCV 75.2*  PLT 831   Basic Metabolic Panel: Recent Labs  Lab 10/19/19 0935 10/20/19 0356 10/21/19 0553 10/22/19 0518  NA 139 142 139 138  K 4.4 4.7 4.6 4.0  CL 108 110 109 108  CO2 17* 23 20* 21*  GLUCOSE 66* 118* 104* 92  BUN 55* 56* 59* 56*  CREATININE 3.07* 3.14* 2.89* 2.66*  CALCIUM 9.5 9.4 9.0 8.7*   GFR: Estimated Creatinine Clearance: 27.8 mL/min (A) (by C-G formula based on SCr of 2.66 mg/dL (H)). Liver Function Tests: No results for input(s): AST, ALT, ALKPHOS, BILITOT, PROT, ALBUMIN in the last 168 hours. No results for input(s): LIPASE, AMYLASE in the last 168 hours. No results for input(s): AMMONIA in the last 168 hours. Coagulation Profile: No results for input(s): INR, PROTIME in the last 168 hours. Cardiac Enzymes: No results for input(s): CKTOTAL, CKMB, CKMBINDEX, TROPONINI in the last 168 hours. BNP (last 3 results) No results for input(s): PROBNP in the last 8760 hours. HbA1C: No results for input(s): HGBA1C in the last 72 hours. CBG: No results for input(s): GLUCAP in the last 168 hours. Lipid Profile: No results for input(s): CHOL, HDL, LDLCALC, TRIG, CHOLHDL, LDLDIRECT in the last 72 hours. Thyroid Function Tests: No results for input(s): TSH, T4TOTAL, FREET4, T3FREE, THYROIDAB in the last 72 hours. Anemia Panel: No  results for input(s): VITAMINB12, FOLATE, FERRITIN, TIBC, IRON, RETICCTPCT in the last 72 hours. Sepsis Labs: No results for input(s): PROCALCITON, LATICACIDVEN in the last 168 hours.  Recent Results (from the past 240 hour(s))  Respiratory Panel by RT PCR (Flu A&B, Covid) - Nasopharyngeal Swab     Status: None   Collection Time: 10/19/19 11:13 PM   Specimen: Nasopharyngeal Swab  Result Value Ref Range Status   SARS Coronavirus 2 by RT PCR NEGATIVE NEGATIVE Final    Comment: (NOTE) SARS-CoV-2 target nucleic acids are NOT DETECTED.  The SARS-CoV-2 RNA is generally detectable in upper respiratoy specimens during the acute phase of infection. The lowest concentration of SARS-CoV-2 viral copies this assay can detect is 131 copies/mL. A negative result does not preclude SARS-Cov-2 infection and should not be used as the sole basis for treatment or other patient management decisions. A negative result may occur with  improper specimen collection/handling, submission of specimen other than nasopharyngeal swab, presence of viral mutation(s)  within the areas targeted by this assay, and inadequate number of viral copies (<131 copies/mL). A negative result must be combined with clinical observations, patient history, and epidemiological information. The expected result is Negative.  Fact Sheet for Patients:  PinkCheek.be  Fact Sheet for Healthcare Providers:  GravelBags.it  This test is no t yet approved or cleared by the [place] FDA and  has been authorized for detection and/or diagnosis of SARS-CoV-2 by FDA under an Emergency Use Authorization (EUA). This EUA will remain  in effect (meaning this test can be used) for the duration of the COVID-19 declaration under Section 564(b)(1) of the Act, 21 U.S.C. section 360bbb-3(b)(1), unless the authorization is terminated or revoked sooner.     Influenza A by PCR NEGATIVE NEGATIVE  Final   Influenza B by PCR NEGATIVE NEGATIVE Final    Comment: (NOTE) The Xpert Xpress SARS-CoV-2/FLU/RSV assay is intended as an aid in  the diagnosis of influenza from Nasopharyngeal swab specimens and  should not be used as a sole basis for treatment. Nasal washings and  aspirates are unacceptable for Xpert Xpress SARS-CoV-2/FLU/RSV  testing.  Fact Sheet for Patients: PinkCheek.be  Fact Sheet for Healthcare Providers: GravelBags.it  This test is not yet approved or cleared by the [place] FDA and  has been authorized for detection and/or diagnosis of SARS-CoV-2 by  FDA under an Emergency Use Authorization (EUA). This EUA will remain  in effect (meaning this test can be used) for the duration of the  Covid-19 declaration under Section 564(b)(1) of the Act, 21  U.S.C. section 360bbb-3(b)(1), unless the authorization is  terminated or revoked. Performed at [facility], [address] [place], [place] [address]      Radiology Studies: US RENAL  Result Date: 10/20/2019 CLINICAL DATA:  64 year old female with acute renal failure. EXAM: RENAL / URINARY TRACT ULTRASOUND COMPLETE COMPARISON:  CT abdomen pelvis dated 08/15/2019. FINDINGS: Right Kidney: Renal measurements: 10.9 x 4.6 x 4.9 cm = volume: 127 mL. There is mild diffuse increased echogenicity. No hydronephrosis or shadowing stone. There is a 1.7 x 1.1 x 1.1 cm lateral interpolar cyst. Left Kidney: Renal measurements: 10.1 x 4.8 x 5.3 cm = volume: 132 mL. Mild increased echogenicity. No hydronephrosis or shadowing stone. Bladder: The bladder is partially visualized. Other: There is a small ascites and anasarca. IMPRESSION: 1. Increased renal parenchymal echogenicity in keeping with chronic kidney disease. No hydronephrosis or shadowing stone. 2. Small ascites. Electronically Signed   By: Anner Crete M.D.   On: 10/20/2019 17:59    Scheduled Meds:   diphenhydrAMINE  6.25 mg Oral Once   [START ON 10/23/2019] furosemide  40 mg Oral Daily   mouth rinse  15 mL Mouth Rinse BID   melatonin  5 mg Oral QHS   rivaroxaban  15 mg Oral Daily   sodium chloride flush  3 mL Intravenous Q12H   Continuous Infusions:  sodium chloride       LOS: 3 days   Time spent: 30 minutes.  I personally reviewed her chart.  Desma Maxim, MD [facility]  If 7PM-7AM, please contact night-coverage [email or web address]  10/22/2019, 11:21 AM

## 2019-10-22 NOTE — Progress Notes (Signed)
Katelyn Lane  ROUNDING NOTE   Subjective:   [name] admitted on 10/19/2019 for Peripheral edema [R60.9] Acute CHF (congestive heart failure) ([place]) [I50.9] AKI (acute Lane injury) ([place]) [N17.9] Congestive heart failure, unspecified HF chronicity, unspecified heart failure type ([place]) [I50.9]  Patient sitting up in bed, consuming lunch, appears in good spirits.She denies nausea or vomiting, reports feeling much better.Patient able to talk in full sentences, no worsening SOB,has her nasal cannula off of her nostrils.  Objective:  Vital signs in last 24 hours:  Temp:  [97.6 F (36.4 C)-98.6 F (37 C)] 97.6 F (36.4 C) (10/14 1229) Pulse Rate:  [67-79] 67 (10/14 1229) Resp:  [17-20] 19 (10/14 1229) BP: (100-142)/(69-107) 100/69 (10/14 1229) SpO2:  [99 %-100 %] 99 % (10/14 1229) Weight:  [114.6 kg] 114.6 kg (10/14 0302)  Weight change: 0.544 kg Filed Weights   10/20/19 0554 10/21/19 0333 10/22/19 0302  Weight: 112.9 kg 114.1 kg 114.6 kg    Intake/Output: I/O last 3 completed shifts: In: 360 [P.O.:360] Out: 1350 [Urine:1350]   Intake/Output this shift:  No intake/output data recorded.  Physical Exam: General: In no acute distress  Head: Normocephalic,atraumatic  Eyes: Sclerae and conjunctivae clear  Neck: Supple, trachea midline  Lungs:  Respirations even,unlabored, bibasilar crackles +  Heart: S1S2 Regular rate and rhythm  Abdomen:  Soft, nontender, non distended  Extremities: 2+ peripheral edema  Neurologic: Awake,alert, oriented x3  Skin: No lesions or rashes    Basic Metabolic Panel: Recent Labs  Lab 10/19/19 0935 10/19/19 0935 10/20/19 0356 10/21/19 0553 10/22/19 0518  NA 139  --  142 139 138  K 4.4  --  4.7 4.6 4.0  CL 108  --  110 109 108  CO2 17*  --  23 20* 21*  GLUCOSE 66*  --  118* 104* 92  BUN 55*  --  56* 59* 56*  CREATININE 3.07*  --  3.14* 2.89* 2.66*  CALCIUM 9.5   < > 9.4 9.0 8.7*   < > = values in this interval not  displayed.    Liver Function Tests: No results for input(s): AST, ALT, ALKPHOS, BILITOT, PROT, ALBUMIN in the last 168 hours. No results for input(s): LIPASE, AMYLASE in the last 168 hours. No results for input(s): AMMONIA in the last 168 hours.  CBC: Recent Labs  Lab 10/19/19 0935  WBC 5.7  HGB 12.2  HCT 38.5  MCV 75.2*  PLT 331    Cardiac Enzymes: No results for input(s): CKTOTAL, CKMB, CKMBINDEX, TROPONINI in the last 168 hours.  BNP: Invalid input(s): POCBNP  CBG: No results for input(s): GLUCAP in the last 168 hours.  Microbiology: Results for orders placed or performed during the hospital encounter of 10/19/19  Respiratory Panel by RT PCR (Flu A&B, Covid) - Nasopharyngeal Swab     Status: None   Collection Time: 10/19/19 11:13 PM   Specimen: Nasopharyngeal Swab  Result Value Ref Range Status   SARS Coronavirus 2 by RT PCR NEGATIVE NEGATIVE Final    Comment: (NOTE) SARS-CoV-2 target nucleic acids are NOT DETECTED.  The SARS-CoV-2 RNA is generally detectable in upper respiratoy specimens during the acute phase of infection. The lowest concentration of SARS-CoV-2 viral copies this assay can detect is 131 copies/mL. A negative result does not preclude SARS-Cov-2 infection and should not be used as the sole basis for treatment or other patient management decisions. A negative result may occur with  improper specimen collection/handling, submission of specimen other than nasopharyngeal swab,  presence of viral mutation(s) within the areas targeted by this assay, and inadequate number of viral copies (<131 copies/mL). A negative result must be combined with clinical observations, patient history, and epidemiological information. The expected result is Negative.  Fact Sheet for Patients:  PinkCheek.be  Fact Sheet for Healthcare Providers:  GravelBags.it  This test is no t yet approved or cleared by the  [place] FDA and  has been authorized for detection and/or diagnosis of SARS-CoV-2 by FDA under an Emergency Use Authorization (EUA). This EUA will remain  in effect (meaning this test can be used) for the duration of the COVID-19 declaration under Section 564(b)(1) of the Act, 21 U.S.C. section 360bbb-3(b)(1), unless the authorization is terminated or revoked sooner.     Influenza A by PCR NEGATIVE NEGATIVE Final   Influenza B by PCR NEGATIVE NEGATIVE Final    Comment: (NOTE) The Xpert Xpress SARS-CoV-2/FLU/RSV assay is intended as an aid in  the diagnosis of influenza from Nasopharyngeal swab specimens and  should not be used as a sole basis for treatment. Nasal washings and  aspirates are unacceptable for Xpert Xpress SARS-CoV-2/FLU/RSV  testing.  Fact Sheet for Patients: PinkCheek.be  Fact Sheet for Healthcare Providers: GravelBags.it  This test is not yet approved or cleared by the [place] FDA and  has been authorized for detection and/or diagnosis of SARS-CoV-2 by  FDA under an Emergency Use Authorization (EUA). This EUA will remain  in effect (meaning this test can be used) for the duration of the  Covid-19 declaration under Section 564(b)(1) of the Act, 21  U.S.C. section 360bbb-3(b)(1), unless the authorization is  terminated or revoked. Performed at [facility], [address] [place], [place] [address]     Coagulation Studies: No results for input(s): LABPROT, INR in the last 72 hours.  Urinalysis: No results for input(s): COLORURINE, LABSPEC, PHURINE, GLUCOSEU, HGBUR, BILIRUBINUR, KETONESUR, PROTEINUR, UROBILINOGEN, NITRITE, LEUKOCYTESUR in the last 72 hours.  Invalid input(s): APPERANCEUR    Imaging: US RENAL  Result Date: 10/20/2019 CLINICAL DATA:  64 year old female with acute renal failure. EXAM: RENAL / URINARY TRACT ULTRASOUND COMPLETE COMPARISON:  CT abdomen pelvis dated  08/15/2019. FINDINGS: Right Lane: Renal measurements: 10.9 x 4.6 x 4.9 cm = volume: 127 mL. There is mild diffuse increased echogenicity. No hydronephrosis or shadowing stone. There is a 1.7 x 1.1 x 1.1 cm lateral interpolar cyst. Left Lane: Renal measurements: 10.1 x 4.8 x 5.3 cm = volume: 132 mL. Mild increased echogenicity. No hydronephrosis or shadowing stone. Bladder: The bladder is partially visualized. Other: There is a small ascites and anasarca. IMPRESSION: 1. Increased renal parenchymal echogenicity in keeping with chronic Lane disease. No hydronephrosis or shadowing stone. 2. Small ascites. Electronically Signed   By: Anner Crete M.D.   On: 10/20/2019 17:59     Medications:   . sodium chloride     . diphenhydrAMINE  6.25 mg Oral Once  . [START ON 10/23/2019] furosemide  40 mg Oral Daily  . mouth rinse  15 mL Mouth Rinse BID  . melatonin  5 mg Oral QHS  . rivaroxaban  15 mg Oral Daily  . sodium chloride flush  3 mL Intravenous Q12H   sodium chloride, acetaminophen, sodium chloride flush  Assessment/ Plan:  [name] is a 64 y.o. black female with congestive heart failure, hypertension, hyperlipidemia, gout, COPD, anemia, cocaine abuse who is admitted to [facility] on 10/19/2019 for Peripheral edema [R60.9] Acute CHF (congestive heart failure) ([place]) [I50.9] AKI (acute  Lane injury) ([place]) [N17.9] Congestive heart failure, unspecified HF chronicity, unspecified heart failure type ([place]) [I50.9]  1. Acute Lane injury on chronic Lane disease stage IIIB. Baseline creatinine of 1.66, GFR of 32. With history of proteinuria.  Chronic Lane disease secondary to hypertension  Acute renal failure secondary to acute cardiorenal syndrome Urine output for the preceding 24 hours is 1,050 ml Creatinine down to 2.66 BUN 56 - Holding Entresto -Continue Furosemide   2. Hypotension with acute exacerbation of systolic and diastolic congestive heart failure. Echocardiogram  06/22/19 EF of 25-30%.  - IV furosemide -Blood pressure readings in acceptable range - Cardiology team following     LOS: 3 Craig Ionescu 10/14/20211:34 PM

## 2019-10-22 NOTE — Plan of Care (Signed)

## 2019-10-22 NOTE — Progress Notes (Signed)
Patient ID: Katelyn Lane, female   DOB: [birth year], 64 y.o.   MRN: [id number]   Rama Anne Fu MD Psychiatry Note  Brief entry   I stopped by but patient occupied in rest room   Will have to stop by in am   Chart reviewed however  Unclear re her mental status and need further collateral   Thanks   Janese Banks MD

## 2019-10-22 NOTE — Progress Notes (Signed)
PT Cancellation Note  Patient Details Name: Katelyn Lane MRN: [id number] DOB: 02/17/1955   Cancelled Treatment:    Reason Eval/Treat Not Completed:  (Evaluation attempted for third consecutive date. Patient sleeping upon arrival to room, but awakenes to voice.  Adamantly refuses participation with evaluation at this time, easily agitated with effforts to encourage. "Lady, don't start with me. I AIN'T going to do it. You can just go on out of here". Unable to redirect or reason with at this time.  Will complete order at this time due to persistent refusal of services. Please re-consult should needs and willingness to actively participate change during stay.)  Maydelin Deming H. Owens Shark, PT, DPT, NCS 10/22/19, 8:56 AM [phone]

## 2019-10-23 DIAGNOSIS — I5041 Acute combined systolic (congestive) and diastolic (congestive) heart failure: Secondary | ICD-10-CM | POA: Diagnosis not present

## 2019-10-23 LAB — HEPATIC FUNCTION PANEL
ALT: 15 U/L (ref 0–44)
AST: 23 U/L (ref 15–41)
Albumin: 2.5 g/dL — ABNORMAL LOW (ref 3.5–5.0)
Alkaline Phosphatase: 119 U/L (ref 38–126)
Bilirubin, Direct: 0.6 mg/dL — ABNORMAL HIGH (ref 0.0–0.2)
Indirect Bilirubin: 0.8 mg/dL (ref 0.3–0.9)
Total Bilirubin: 1.4 mg/dL — ABNORMAL HIGH (ref 0.3–1.2)
Total Protein: 6 g/dL — ABNORMAL LOW (ref 6.5–8.1)

## 2019-10-23 LAB — CBC
HCT: 36.3 % (ref 36.0–46.0)
Hemoglobin: 11.7 g/dL — ABNORMAL LOW (ref 12.0–15.0)
MCH: 24 pg — ABNORMAL LOW (ref 26.0–34.0)
MCHC: 32.2 g/dL (ref 30.0–36.0)
MCV: 74.4 fL — ABNORMAL LOW (ref 80.0–100.0)
Platelets: 302 10*3/uL (ref 150–400)
RBC: 4.88 MIL/uL (ref 3.87–5.11)
RDW: 23.8 % — ABNORMAL HIGH (ref 11.5–15.5)
WBC: 4.4 10*3/uL (ref 4.0–10.5)
nRBC: 0 % (ref 0.0–0.2)

## 2019-10-23 LAB — BASIC METABOLIC PANEL
Anion gap: 9 (ref 5–15)
BUN: 57 mg/dL — ABNORMAL HIGH (ref 8–23)
CO2: 23 mmol/L (ref 22–32)
Calcium: 8.6 mg/dL — ABNORMAL LOW (ref 8.9–10.3)
Chloride: 107 mmol/L (ref 98–111)
Creatinine, Ser: 2.67 mg/dL — ABNORMAL HIGH (ref 0.44–1.00)
GFR, Estimated: 18 mL/min — ABNORMAL LOW (ref >60)
Glucose, Bld: 130 mg/dL — ABNORMAL HIGH (ref 70–99)
Potassium: 4 mmol/L (ref 3.5–5.1)
Sodium: 139 mmol/L (ref 135–145)

## 2019-10-23 LAB — LIPASE, BLOOD: Lipase: 79 U/L — ABNORMAL HIGH (ref 11–51)

## 2019-10-23 LAB — ALBUMIN: Albumin: 2.5 g/dL — ABNORMAL LOW (ref 3.5–5.0)

## 2019-10-23 MED ORDER — MORPHINE SULFATE (PF) 2 MG/ML IV SOLN: 1.0000 mg | INTRAVENOUS | Status: DC | PRN

## 2019-10-23 MED ORDER — ALUM & MAG HYDROXIDE-SIMETH 200-200-20 MG/5ML PO SUSP
30.0000 mL | ORAL | Status: DC | PRN
  Administered 2019-10-23 – 2019-12-27 (×4): 30 mL via ORAL
  Filled 2019-10-23 (×5): qty 30

## 2019-10-23 MED ORDER — ALUM & MAG HYDROXIDE-SIMETH 200-200-20 MG/5ML PO SUSP
30.0000 mL | Freq: Once | ORAL | Status: AC
  Administered 2019-10-23: 30 mL via ORAL
  Filled 2019-10-23: qty 30

## 2019-10-23 MED ORDER — LIDOCAINE VISCOUS HCL 2 % MT SOLN
15.0000 mL | OROMUCOSAL | Status: DC | PRN
  Administered 2019-10-23: 15 mL via ORAL
  Filled 2019-10-23 (×3): qty 15

## 2019-10-23 MED ORDER — POLYETHYLENE GLYCOL 3350 17 G PO PACK
17.0000 g | PACK | Freq: Every day | ORAL | Status: DC
  Administered 2019-10-24 – 2019-10-31 (×4): 17 g via ORAL
  Filled 2019-10-23 (×7): qty 1

## 2019-10-23 MED ORDER — ALBUMIN HUMAN 25 % IV SOLN
25.0000 g | Freq: Three times a day (TID) | INTRAVENOUS | Status: AC
  Administered 2019-10-23 – 2019-10-24 (×3): 25 g via INTRAVENOUS
  Filled 2019-10-23 (×3): qty 100

## 2019-10-23 MED ORDER — GLYCERIN (LAXATIVE) 2.1 G RE SUPP
1.0000 | Freq: Every day | RECTAL | Status: DC | PRN
  Filled 2019-10-23 (×3): qty 1

## 2019-10-23 MED ORDER — ALUM & MAG HYDROXIDE-SIMETH 200-200-20 MG/5ML PO SUSP
30.0000 mL | ORAL | Status: DC | PRN
  Administered 2019-10-23 (×4): 30 mL via ORAL
  Filled 2019-10-23 (×5): qty 30

## 2019-10-23 MED ORDER — FUROSEMIDE 40 MG PO TABS
40.0000 mg | ORAL_TABLET | Freq: Two times a day (BID) | ORAL | Status: DC
  Administered 2019-10-23: 40 mg via ORAL
  Filled 2019-10-23: qty 1

## 2019-10-23 MED ORDER — METOLAZONE 2.5 MG PO TABS
2.5000 mg | ORAL_TABLET | Freq: Every day | ORAL | Status: DC
  Administered 2019-10-23 – 2019-10-24 (×2): 2.5 mg via ORAL
  Filled 2019-10-23 (×2): qty 1

## 2019-10-23 MED ORDER — LIDOCAINE VISCOUS HCL 2 % MT SOLN
15.0000 mL | Freq: Once | OROMUCOSAL | Status: AC
  Administered 2019-10-23: 15 mL via ORAL
  Filled 2019-10-23: qty 15

## 2019-10-23 NOTE — Progress Notes (Signed)
GI cocktail given per MD order.

## 2019-10-23 NOTE — Progress Notes (Addendum)
PROGRESS NOTE    Katelyn Lane  RJJ:[id number] DOB: 1955-07-11 DOA: 10/19/2019 PCP: [name], MD   Brief Narrative: Taken from H&P and chart review. Katelyn Lane is a 64 y.o. 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 Found to have BNP greater than 4500, elevated troponin and AKI.  Multiple prior admissions with similar symptoms and complaints.  Prolonged hospitalization in August 2021 when she was discharged to SNF.  Not clear when she get out of SNF.  Continues to complain that she is homeless but per social worker she do have a house.  Not take her medication despite being provided with medical management.   Subjective: Complaining of diffuse abdominal pain, says does not localize. No vomiting, no fevers, no diarrhea. Gi cocktail helped somewhat. Denies chest pain or sob.   Assessment & Plan:   Principal Problem:   Acute CHF (congestive heart failure) (HCC) Active Problems:   AKI (acute kidney injury) ([place])   Crack cocaine use   Acute respiratory failure with hypoxia (HCC)   LV (left ventricular) mural thrombus   Cocaine abuse ([place])   Peripheral edema  Acute on chronic combined systolic and diastolic heart failure.  Patient with EF of 25 to 30% and grade 3 diastolic dysfunction on echocardiogram done in June 2021.  Multiple admissions for similar symptoms and being noncompliant.  Continue to use cocaine. Mild trop elevation, likely demand.  -discussion w/ nephrology and cardiology today, will attempt transition to metolazone 2.5; nephrology has also ordered albumin infusion  -Continue to monitor daily weight and BMP. -Strict intake and output. 600 cc of urine recorded yesterday - hyolding entresto and bb until bp stable -  cardiology following, appreciate recs  Abdominal pain - complaining of diffuse abd pain, no other symptoms. No chest pain or sob. No vomiting or diarrhea. No fever, wbcs not elevated. GI cocktail has helped somewhat. U/s a few months ago showed possible mild cirrhosis, small amount ascites - LFTs show mildly elevated bili, stable from prior. Also new mildly elevated lipase. Given gfr need to hold off on contrast-enhanced CT, will plan for u/s tomorrow morning. NPO at midnight.   Acute hypoxic respiratory failure - requiring 6 L on admission, o2 off oxygen was 89%, now weaned to 3 L, likely 2/2 above cardiac process - wean as able  Hypotension - resolved, off midodrine - diuresis as above - holding bb and entresto as above  AKI with CKD stage IIIb/IV. Likely cardiorenal syndrome. Creatinine about 3, baseline around 2. Cr stable at 2.67 today. U/s showing medical renal disease. -Continue with diuresis. -nephrology following, appreciate recs. Albumin infusion today -Continue to monitor renal function. -Avoid nephrotoxins.  Cocaine use.  According to H&P last use was 2 to 3 days ago.  UDS positive for cocaine.  Patient refused to answer any questions regarding her cocaine use .  This will complicate overall prognosis and care.  History of ventricular thrombus in 01/2019 and hx of CVA Continue Xarelto  Social issues.  Apparently she was not competent enough to make her own decisions according to the psych evaluation done during recent hospitalization. -will involve psychiatry; social work will also attempt to contact patient's APS caseworker   Objective: Vitals:   10/22/19 1702 10/22/19 1925 10/23/19 0423 10/23/19 0807  BP: (!) 135/98 (!) 128/99 Marland Kitchen)  125/94 111/77  Pulse: 93 81 76 86  Resp: 19 18 20 16   Temp: 97.7 F (36.5 C) 97.9 F (36.6 C) 98 F (36.7 C) 97.9 F (36.6 C)  TempSrc:  Oral Oral Oral  SpO2: 92% 93% 100% 99%  Weight:   115.8 kg   Height:        Intake/Output  Summary (Last 24 hours) at 10/23/2019 1156 Last data filed at 10/23/2019 0950 Gross per 24 hour  Intake 360 ml  Output 600 ml  Net -240 ml   Filed Weights   10/21/19 0333 10/22/19 0302 10/23/19 0423  Weight: 114.1 kg 114.6 kg 115.8 kg    Examination:  General exam: complaining of abd pain Respiratory system: Clear to auscultation. Respiratory effort normal. Cardiovascular system: S1 & S2 heard, RRR. Soft systolic murmur. Distant heart sounds Gastrointestinal system: Soft, diffuse mild ttp, no rebound Central nervous system: Alert and oriented. No focal neurological deficits. Extremities: 1+ LE edema, no cyanosis, pulses intact and symmetrical. Psychiatry: Judgement and insight appear impaired.   DVT prophylaxis: Xarelto Code Status: Full Family Communication: none at bedside Disposition Plan:  Status is: Inpatient  Remains inpatient appropriate because:Inpatient level of care appropriate due to severity of illness   Dispo: The patient is from: Home/homeless              Anticipated d/c is to: To be determined              Anticipated d/c date is: 2 days              Patient currently is not medically stable to d/c.   Consultants:   Nephrology, cardiology  Procedures: renal u/s Antimicrobials: none  Data Reviewed: I have personally reviewed following labs and imaging studies  CBC: Recent Labs  Lab 10/19/19 0935 10/23/19 0450  WBC 5.7 4.4  HGB 12.2 11.7*  HCT 38.5 36.3  MCV 75.2* 74.4*  PLT 331 161   Basic Metabolic Panel: Recent Labs  Lab 10/19/19 0935 10/20/19 0356 10/21/19 0553 10/22/19 0518 10/23/19 0450  NA 139 142 139 138 139  K 4.4 4.7 4.6 4.0 4.0  CL 108 110 109 108 107  CO2 17* 23 20* 21* 23  GLUCOSE 66* 118* 104* 92 130*  BUN 55* 56* 59* 56* 57*  CREATININE 3.07* 3.14* 2.89* 2.66* 2.67*  CALCIUM 9.5 9.4 9.0 8.7* 8.6*   GFR: Estimated Creatinine Clearance: 27.9 mL/min (A) (by C-G formula based on SCr of 2.67 mg/dL (H)). Liver  Function Tests: Recent Labs  Lab 10/23/19 0450  ALBUMIN 2.5*   No results for input(s): LIPASE, AMYLASE in the last 168 hours. No results for input(s): AMMONIA in the last 168 hours. Coagulation Profile: No results for input(s): INR, PROTIME in the last 168 hours. Cardiac Enzymes: No results for input(s): CKTOTAL, CKMB, CKMBINDEX, TROPONINI in the last 168 hours. BNP (last 3 results) No results for input(s): PROBNP in the last 8760 hours. HbA1C: No results for input(s): HGBA1C in the last 72 hours. CBG: No results for input(s): GLUCAP in the last 168 hours. Lipid Profile: No results for input(s): CHOL, HDL, LDLCALC, TRIG, CHOLHDL, LDLDIRECT in the last 72 hours. Thyroid Function Tests: No results for input(s): TSH, T4TOTAL, FREET4, T3FREE, THYROIDAB in the last 72 hours. Anemia Panel: No results for input(s): VITAMINB12, FOLATE, FERRITIN, TIBC, IRON, RETICCTPCT in the last 72 hours. Sepsis Labs: No results for input(s): PROCALCITON, LATICACIDVEN in the last 168 hours.  Recent Results (from the past 240 hour(s))  Respiratory Panel by RT PCR (Flu A&B, Covid) - Nasopharyngeal Swab     Status: None   Collection Time: 10/19/19 11:13 PM   Specimen: Nasopharyngeal Swab  Result Value Ref Range Status   SARS Coronavirus 2 by RT PCR NEGATIVE NEGATIVE Final    Comment: (NOTE) SARS-CoV-2 target nucleic acids are NOT DETECTED.  The SARS-CoV-2 RNA is generally detectable in upper respiratoy specimens during the acute phase of infection. The lowest concentration of SARS-CoV-2 viral copies this assay can detect is 131 copies/mL. A negative result does not preclude SARS-Cov-2 infection and should not be used as the sole basis for treatment or other patient management decisions. A negative result may occur with  improper specimen collection/handling, submission of specimen other than nasopharyngeal swab, presence of viral mutation(s) within the areas targeted by this assay, and inadequate  number of viral copies (<131 copies/mL). A negative result must be combined with clinical observations, patient history, and epidemiological information. The expected result is Negative.  Fact Sheet for Patients:  PinkCheek.be  Fact Sheet for Healthcare Providers:  GravelBags.it  This test is no t yet approved or cleared by the [place] FDA and  has been authorized for detection and/or diagnosis of SARS-CoV-2 by FDA under an Emergency Use Authorization (EUA). This EUA will remain  in effect (meaning this test can be used) for the duration of the COVID-19 declaration under Section 564(b)(1) of the Act, 21 U.S.C. section 360bbb-3(b)(1), unless the authorization is terminated or revoked sooner.     Influenza A by PCR NEGATIVE NEGATIVE Final   Influenza B by PCR NEGATIVE NEGATIVE Final    Comment: (NOTE) The Xpert Xpress SARS-CoV-2/FLU/RSV assay is intended as an aid in  the diagnosis of influenza from Nasopharyngeal swab specimens and  should not be used as a sole basis for treatment. Nasal washings and  aspirates are unacceptable for Xpert Xpress SARS-CoV-2/FLU/RSV  testing.  Fact Sheet for Patients: PinkCheek.be  Fact Sheet for Healthcare Providers: GravelBags.it  This test is not yet approved or cleared by the [place] FDA and  has been authorized for detection and/or diagnosis of SARS-CoV-2 by  FDA under an Emergency Use Authorization (EUA). This EUA will remain  in effect (meaning this test can be used) for the duration of the  Covid-19 declaration under Section 564(b)(1) of the Act, 21  U.S.C. section 360bbb-3(b)(1), unless the authorization is  terminated or revoked. Performed at [facility], [address] [place], [place] [address]      Radiology Studies: No results found.  Scheduled Meds: . furosemide  40 mg Oral Daily  .  mouth rinse  15 mL Mouth Rinse BID  . melatonin  5 mg Oral QHS  . rivaroxaban  15 mg Oral Daily  . sodium chloride flush  3 mL Intravenous Q12H   Continuous Infusions: . sodium chloride    . albumin human       LOS: 4 days   Time spent: 25 minutes.  I personally reviewed her chart.  Desma Maxim, MD [facility]  If 7PM-7AM, please contact night-coverage [email or web address]  10/23/2019, 11:56 AM

## 2019-10-23 NOTE — Progress Notes (Signed)
Pt resting quietly in bed now with eyes closed.  Resp even and unlabored.  No voiced or observed needs at this time.

## 2019-10-23 NOTE — Progress Notes (Signed)
[facility]  Patient Description: Katelyn Lane is a 64 year old female with PMH significant for chronic combined systolic HF (KW=40-97% Echo 06/2019), h/o CVA, ventricular thrombus (on Xarelto),   HTN, HLD, CKD Stage IIIb and cocaine use who was admitted for CHF exacerbation thus cardiology was consulted.   SUBJECTIVE: The patient states "my stomach is still hurting." She denies any chest pain or dyspnea at this time. Overnight she was given Mylanta and a GI cocktail which she reports was unsuccessful in relieving her symptoms.   (10/22/19)-The patient reports to be doing well on today and slept well on last night. She denies any chest pain or dyspnea at this time. She continues to c/o BLE pain.   (10/21/19) -The patient reports to be doing well on today. She denies any chest pain or dyspnea at this time.   OBJECTIVE: The patient is very drowsy and is only responding with a few words before drifting back off to sleep. She is resting comfortably in bed and does not appear to be in any distress. She continues to be hypervolemic, on supplemental oxygen and her BLE appears to have slightly improved since yesterday.    (10/22/19) -The patient appears drowsy on today and again did not want to answer many questions. She appears slightly better than yesterday, as her BLE edema has improved some and her dyspnea appears to have improved also. The patient is resting comfortably in bed and continues to utilize supplemental oxygen via [place]. She refuses to have telemetry monitoring at this time.   (10/21/19) -The patient is sleeping at this time and did not want to answer many questions. She continues to appear hypervolemic and is on supplement O2 via nasal cannula. Overnight, the patient's IV infiltrated and upon unsuccessful attempts, the patient refused IV placement; therefore, her dose of IV lasix was not given on last night. The patient is in bed with rails up, vss and she was not on telemetry monitoring.    Vitals:   10/22/19 1702 10/22/19 1925 10/23/19 0423 10/23/19 0807  BP: (!) 135/98 (!) 128/99 (!) 125/94 111/77  Pulse: 93 81 76 86  Resp: 19 18 20 16   Temp: 97.7 F (36.5 C) 97.9 F (36.6 C) 98 F (36.7 C) 97.9 F (36.6 C)  TempSrc:  Oral Oral Oral  SpO2: 92% 93% 100% 99%  Weight:   115.8 kg   Height:         Intake/Output Summary (Last 24 hours) at 10/23/2019 0841 Last data filed at 10/22/2019 2358 Gross per 24 hour  Intake --  Output 600 ml  Net -600 ml      PHYSICAL EXAM  General: Appears disheveled, well developed, well nourished, in no acute distress HEENT: PERRLA. Glossy sclera.  Normocephalic and atraumatic, no JVD, supple. +2 carotid  Lungs: clear bilaterally upon auscultation. Chest expansion symmetrical.  Heart: HRRR . Normal S1 and S2 without gallops or murmurs.  Abdomen: Bowel sounds are positive, abdomen soft and non-tender.  Msk: Normal strength and tone for age. Extremities: moderate BLE edema.  No clubbing or cyanosis.  Neuro: Alert and oriented X 3. Psych: responds appropriately   LABS: Basic Metabolic Panel: Recent Labs    10/22/19 0518 10/23/19 0450  NA 138 139  K 4.0 4.0  CL 108 107  CO2 21* 23  GLUCOSE 92 130*  BUN 56* 57*  CREATININE 2.66* 2.67*  CALCIUM 8.7* 8.6*   Liver Function Tests: No results for input(s): AST, ALT, ALKPHOS, BILITOT, PROT, ALBUMIN in the last  72 hours. No results for input(s): LIPASE, AMYLASE in the last 72 hours. CBC: Recent Labs    10/23/19 0450  WBC 4.4  HGB 11.7*  HCT 36.3  MCV 74.4*  PLT 302   Cardiac Enzymes: No results for input(s): CKTOTAL, CKMB, CKMBINDEX, TROPONINI in the last 72 hours. BNP: Invalid input(s): POCBNP D-Dimer: No results for input(s): DDIMER in the last 72 hours. Hemoglobin A1C: No results for input(s): HGBA1C in the last 72 hours. Fasting Lipid Panel: No results for input(s): CHOL, HDL, LDLCALC, TRIG, CHOLHDL, LDLDIRECT in the last 72 hours. Thyroid Function  Tests: No results for input(s): TSH, T4TOTAL, T3FREE, THYROIDAB in the last 72 hours.  Invalid input(s): FREET3 Anemia Panel: No results for input(s): VITAMINB12, FOLATE, FERRITIN, TIBC, IRON, RETICCTPCT in the last 72 hours.  No results found.   Echo from 06/2019: IMPRESSIONS  1. Left ventricular ejection fraction, by estimation, is 25 to 30%. The  left ventricle has severely decreased function. The left ventricle  demonstrates global hypokinesis. There is mild concentric left ventricular  hypertrophy. Left ventricular diastolic  parameters are consistent with Grade III diastolic dysfunction  (restrictive).  2. Right ventricular systolic function is normal. The right ventricular  size is severely enlarged. There is moderately elevated pulmonary artery  systolic pressure.  3. Left atrial size was severely dilated.  4. Right atrial size was severely dilated.  5. The mitral valve is normal in structure. Mild mitral valve  regurgitation. No evidence of mitral stenosis.  6. The aortic valve is normal in structure. Aortic valve regurgitation is  not visualized. No aortic stenosis is present.  7. The inferior vena cava is normal in size with greater than 50%  respiratory variability, suggesting right atrial pressure of 3 mmHg.   Conclusion(s)/Recommendation(s): Findings consistent with non-ischemic  cardiomyopathy.    TELEMETRY: patient refuses monitoring   ASSESSMENT AND PLAN:  Principal Problem:   Acute CHF (congestive heart failure) (HCC) Active Problems:   AKI (acute kidney injury) ([place])   Crack cocaine use   Acute respiratory failure with hypoxia (HCC)   LV (left ventricular) mural thrombus   Cocaine abuse ([place])   Peripheral edema    ASSESSMENT AND PLAN:  1. Acute on Chronic CHF exacerbation (EF =25-30% on Echo), uncompensated, fairly stable at this time             -BNP >4500             -High Sensitivity troponin=106>>156; likely due to demand ischemia.               -Continue oral Lasicx 40mg  once daily. (Nephrology in agreement)             -Hold Entresto at this time due to hypotension and acute on chronic CKD stage IIIb. Consider restarting as outpatient.              -Trend BMP.              -Hold beta blocker until blood pressure is stabilized. Consider restarting as outpatient.              -1500cc FS.             -Strict I& O's             -Daily weights             - Recommend continuous telemetry monitoring but patient refuses              -low sodium,  heart healthy diet  2. Acute hypoxic respiratory failure, stable              -Continue HF management as stated above.             -Agree with supplemental oxygen therapy. Wean as tolerated.   -RT consult appreciated.   3. Acute on Chronic CKD Stage IIIb, fairly stable             -Nephrology input appreciated.             -We will hold Entresto at this time.              -Monitor BUN/Creatinine which appears to be improving some  4. H/o Ventricular thrombus, stable             -patient on xarelto; continue.   -follow bleeding precautions.   5. Hypotension, resolved, patient is normotensive at this time             -monitor bp q4h.              -hold antihypertensives at this time. Consider restarting as outpatient.  6. HLD, stable             -lipid profile from 05/22/19 is within reference range with a total cholesterol=100, HDL=46, LDL=43 and Triglycerides=56             -continue diet management.               5. Cocaine use   -psychiatric evaluation appreciated.              -Recommend cocaine cessation.             -Recommend social work consult for sociological issues (homelessness).             [place], ACNPC-AG  10/23/2019 8:41 AM

## 2019-10-23 NOTE — Progress Notes (Signed)
Pt yelling/screaming loudly and c/o indigestion and rubbing her abdomen.  MD notified after pt had no relief from Mylanta.  See orders.

## 2019-10-23 NOTE — Progress Notes (Signed)
Mobility Specialist - Progress Note   10/23/19 1108  Mobility  Activity Refused mobility  Mobility performed by Mobility specialist    Pt refused session this AM. Pt states she's having "9/10" abdominal pain. Will re-attempt session at a later date/time.    Gavin Faivre Mobility Specialist  10/23/19, 11:09 AM

## 2019-10-23 NOTE — Progress Notes (Signed)
[facility]  ROUNDING NOTE   Subjective:   Katelyn Lane admitted on 10/19/2019 for Peripheral edema [R60.9] Acute CHF (congestive heart failure) ([place]) [I50.9] AKI (acute kidney injury) ([place]) [N17.9] Congestive heart failure, unspecified HF chronicity, unspecified heart failure type ([place]) [I50.9]  Patient still has complaints of shortness of breath, on 3 L of supplemental O2 via nasal cannula. She still has bilateral lower extremity edema, on furosemide PO.  Objective:  Vital signs in last 24 hours:  Temp:  [97.7 F (36.5 C)-98 F (36.7 C)] 97.9 F (36.6 C) (10/15 0807) Pulse Rate:  [76-93] 86 (10/15 0807) Resp:  [16-20] 16 (10/15 0807) BP: (111-135)/(77-99) 111/77 (10/15 0807) SpO2:  [92 %-100 %] 99 % (10/15 0807) Weight:  [115.8 kg] 115.8 kg (10/15 0423)  Weight change: 1.176 kg Filed Weights   10/21/19 0333 10/22/19 0302 10/23/19 0423  Weight: 114.1 kg 114.6 kg 115.8 kg    Intake/Output: I/O last 3 completed shifts: In: -  Out: 1650 [Urine:1650]   Intake/Output this shift:  Total I/O In: 360 [P.O.:360] Out: -   Physical Exam: General:  Resting in bed, in no acute distress  Head: Normocephalic,atraumatic, nasal cannula in place  Eyes:  Anicteric  Neck: Supple, trachea midline  Lungs:   Respirations symmetrical ,, unlabored, expiratory wheezes +  Heart: S1S2 Regular rate and rhythm  Abdomen:  Soft, nontender, non distended  Extremities: 2+ pitting edema  Neurologic: Oriented x3  Skin: No lesions or rashes    Basic Metabolic Panel: Recent Labs  Lab 10/19/19 0935 10/19/19 0935 10/20/19 0356 10/20/19 0356 10/21/19 0553 10/22/19 0518 10/23/19 0450  NA 139  --  142  --  139 138 139  K 4.4  --  4.7  --  4.6 4.0 4.0  CL 108  --  110  --  109 108 107  CO2 17*  --  23  --  20* 21* 23  GLUCOSE 66*  --  118*  --  104* 92 130*  BUN 55*  --  56*  --  59* 56* 57*  CREATININE 3.07*  --  3.14*  --  2.89* 2.66* 2.67*  CALCIUM 9.5   < > 9.4   < >  9.0 8.7* 8.6*   < > = values in this interval not displayed.    Liver Function Tests: Recent Labs  Lab 10/23/19 0450  AST 23  ALT 15  ALKPHOS 119  BILITOT 1.4*  PROT 6.0*  ALBUMIN 2.5*  2.5*   Recent Labs  Lab 10/23/19 0450  LIPASE 79*   No results for input(s): AMMONIA in the last 168 hours.  CBC: Recent Labs  Lab 10/19/19 0935 10/23/19 0450  WBC 5.7 4.4  HGB 12.2 11.7*  HCT 38.5 36.3  MCV 75.2* 74.4*  PLT 331 302    Cardiac Enzymes: No results for input(s): CKTOTAL, CKMB, CKMBINDEX, TROPONINI in the last 168 hours.  BNP: Invalid input(s): POCBNP  CBG: No results for input(s): GLUCAP in the last 168 hours.  Microbiology: Results for orders placed or performed during the hospital encounter of 10/19/19  Respiratory Panel by RT PCR (Flu A&B, Covid) - Nasopharyngeal Swab     Status: None   Collection Time: 10/19/19 11:13 PM   Specimen: Nasopharyngeal Swab  Result Value Ref Range Status   SARS Coronavirus 2 by RT PCR NEGATIVE NEGATIVE Final    Comment: (NOTE) SARS-CoV-2 target nucleic acids are NOT DETECTED.  The SARS-CoV-2 RNA is generally detectable in upper respiratoy specimens  during the acute phase of infection. The lowest concentration of SARS-CoV-2 viral copies this assay can detect is 131 copies/mL. A negative result does not preclude SARS-Cov-2 infection and should not be used as the sole basis for treatment or other patient management decisions. A negative result may occur with  improper specimen collection/handling, submission of specimen other than nasopharyngeal swab, presence of viral mutation(s) within the areas targeted by this assay, and inadequate number of viral copies (<131 copies/mL). A negative result must be combined with clinical observations, patient history, and epidemiological information. The expected result is Negative.  Fact Sheet for Patients:  PinkCheek.be  Fact Sheet for Healthcare  Providers:  GravelBags.it  This test is no t yet approved or cleared by the [place] FDA and  has been authorized for detection and/or diagnosis of SARS-CoV-2 by FDA under an Emergency Use Authorization (EUA). This EUA will remain  in effect (meaning this test can be used) for the duration of the COVID-19 declaration under Section 564(b)(1) of the Act, 21 U.S.C. section 360bbb-3(b)(1), unless the authorization is terminated or revoked sooner.     Influenza A by PCR NEGATIVE NEGATIVE Final   Influenza B by PCR NEGATIVE NEGATIVE Final    Comment: (NOTE) The Xpert Xpress SARS-CoV-2/FLU/RSV assay is intended as an aid in  the diagnosis of influenza from Nasopharyngeal swab specimens and  should not be used as a sole basis for treatment. Nasal washings and  aspirates are unacceptable for Xpert Xpress SARS-CoV-2/FLU/RSV  testing.  Fact Sheet for Patients: PinkCheek.be  Fact Sheet for Healthcare Providers: GravelBags.it  This test is not yet approved or cleared by the [place] FDA and  has been authorized for detection and/or diagnosis of SARS-CoV-2 by  FDA under an Emergency Use Authorization (EUA). This EUA will remain  in effect (meaning this test can be used) for the duration of the  Covid-19 declaration under Section 564(b)(1) of the Act, 21  U.S.C. section 360bbb-3(b)(1), unless the authorization is  terminated or revoked. Performed at [facility], [address] [place], [place] [address]     Coagulation Studies: No results for input(s): LABPROT, INR in the last 72 hours.  Urinalysis: No results for input(s): COLORURINE, LABSPEC, PHURINE, GLUCOSEU, HGBUR, BILIRUBINUR, KETONESUR, PROTEINUR, UROBILINOGEN, NITRITE, LEUKOCYTESUR in the last 72 hours.  Invalid input(s): APPERANCEUR    Imaging: No results found.   Medications:   . sodium chloride    . albumin  human 25 g (10/23/19 1407)   . mouth rinse  15 mL Mouth Rinse BID  . melatonin  5 mg Oral QHS  . metolazone  2.5 mg Oral Daily  . rivaroxaban  15 mg Oral Daily  . sodium chloride flush  3 mL Intravenous Q12H   sodium chloride, acetaminophen, alum & mag hydroxide-simeth, sodium chloride flush  Assessment/ Plan:  Katelyn Lane is a 64 y.o. black female with congestive heart failure, hypertension, hyperlipidemia, gout, COPD, anemia, cocaine abuse who is admitted to [facility] on 10/19/2019 for Peripheral edema [R60.9] Acute CHF (congestive heart failure) ([place]) [I50.9] AKI (acute kidney injury) ([place]) [N17.9] Congestive heart failure, unspecified HF chronicity, unspecified heart failure type ([place]) [I50.9]  1. Acute kidney injury on chronic kidney disease stage IIIB. Baseline creatinine of 1.66, GFR of 32. With history of proteinuria.  Chronic kidney disease secondary to hypertension  Acute renal failure secondary to acute cardiorenal syndrome  Urine output for the preceding 24 hours is 600 ml Creatinine 2.67 BUN 57 - Holding Entresto -Albumin 25%  Q8hours   2. Hypotension with acute exacerbation of systolic and diastolic congestive heart failure. Echocardiogram 06/22/19 EF of 25-30%.   -Stays normotensive at this point -Will Continue Furosemide and add metolazone today - Cardiology team following     LOS: 4 Grissel Tyrell 10/15/20212:15 PM

## 2019-10-23 NOTE — Progress Notes (Signed)
Pt refused night meds.  Pt c/o'd during assessment that she just wants to sleep and be left alone.  She did request that the heat be turned up as she is cold.  Heat turned up to 80 and door closed.

## 2019-10-24 ENCOUNTER — Inpatient Hospital Stay: Payer: Medicaid Other

## 2019-10-24 DIAGNOSIS — K920 Hematemesis: Secondary | ICD-10-CM

## 2019-10-24 DIAGNOSIS — K7460 Unspecified cirrhosis of liver: Secondary | ICD-10-CM

## 2019-10-24 DIAGNOSIS — I5041 Acute combined systolic (congestive) and diastolic (congestive) heart failure: Secondary | ICD-10-CM | POA: Diagnosis not present

## 2019-10-24 LAB — COMPREHENSIVE METABOLIC PANEL
ALT: 14 U/L (ref 0–44)
AST: 25 U/L (ref 15–41)
Albumin: 3.2 g/dL — ABNORMAL LOW (ref 3.5–5.0)
Alkaline Phosphatase: 113 U/L (ref 38–126)
Anion gap: 13 (ref 5–15)
BUN: 55 mg/dL — ABNORMAL HIGH (ref 8–23)
CO2: 22 mmol/L (ref 22–32)
Calcium: 9.4 mg/dL (ref 8.9–10.3)
Chloride: 103 mmol/L (ref 98–111)
Creatinine, Ser: 2.81 mg/dL — ABNORMAL HIGH (ref 0.44–1.00)
GFR, Estimated: 17 mL/min — ABNORMAL LOW (ref >60)
Glucose, Bld: 117 mg/dL — ABNORMAL HIGH (ref 70–99)
Potassium: 4.7 mmol/L (ref 3.5–5.1)
Sodium: 138 mmol/L (ref 135–145)
Total Bilirubin: 2.3 mg/dL — ABNORMAL HIGH (ref 0.3–1.2)
Total Protein: 7 g/dL (ref 6.5–8.1)

## 2019-10-24 LAB — LIPASE, BLOOD: Lipase: 55 U/L — ABNORMAL HIGH (ref 11–51)

## 2019-10-24 MED ORDER — FUROSEMIDE 10 MG/ML IJ SOLN
30.0000 mg/h | INTRAVENOUS | Status: DC
  Administered 2019-10-24: 8 mg/h via INTRAVENOUS
  Administered 2019-10-24: 40 mg/h via INTRAVENOUS
  Administered 2019-10-25: 30 mg/h via INTRAVENOUS
  Filled 2019-10-24 (×4): qty 25

## 2019-10-24 MED ORDER — BISACODYL 10 MG RE SUPP
10.0000 mg | Freq: Every day | RECTAL | Status: DC | PRN
  Administered 2019-10-24: 10 mg via RECTAL
  Filled 2019-10-24 (×2): qty 1

## 2019-10-24 MED ORDER — SODIUM CHLORIDE 0.9 % IV SOLN
80.0000 mg | Freq: Once | INTRAVENOUS | Status: AC
  Administered 2019-10-25: 80 mg via INTRAVENOUS
  Filled 2019-10-24: qty 80

## 2019-10-24 MED ORDER — FUROSEMIDE 10 MG/ML IJ SOLN: 80.0000 mg | Freq: Once | INTRAMUSCULAR | Status: DC

## 2019-10-24 MED ORDER — CHLORHEXIDINE GLUCONATE CLOTH 2 % EX PADS
6.0000 | MEDICATED_PAD | Freq: Every day | CUTANEOUS | Status: DC
  Administered 2019-10-24 – 2019-11-16 (×22): 6 via TOPICAL

## 2019-10-24 MED ORDER — ONDANSETRON HCL 4 MG/2ML IJ SOLN
4.0000 mg | Freq: Three times a day (TID) | INTRAMUSCULAR | Status: DC | PRN
  Administered 2019-10-24: 4 mg via INTRAVENOUS
  Filled 2019-10-24: qty 2

## 2019-10-24 MED ORDER — FUROSEMIDE 10 MG/ML IJ SOLN
80.0000 mg | Freq: Once | INTRAMUSCULAR | Status: AC
  Administered 2019-10-24: 80 mg via INTRAVENOUS
  Filled 2019-10-24: qty 8

## 2019-10-24 MED ORDER — RIVAROXABAN 20 MG PO TABS: 20.0000 mg | ORAL_TABLET | Freq: Every day | ORAL | Status: DC

## 2019-10-24 MED ORDER — ALBUMIN HUMAN 25 % IV SOLN
12.5000 g | Freq: Two times a day (BID) | INTRAVENOUS | Status: AC
  Administered 2019-10-24 – 2019-10-25 (×3): 12.5 g via INTRAVENOUS
  Filled 2019-10-24 (×3): qty 50

## 2019-10-24 MED ORDER — BISACODYL 10 MG RE SUPP: 10.0000 mg | Freq: Every day | RECTAL | Status: DC | PRN

## 2019-10-24 NOTE — Progress Notes (Signed)
Mobility Specialist - Progress Note   10/24/19 1500  Mobility  Range of Motion/Exercises Right arm;Left arm (arm raises, crossover reach, crossover stretch, elbow flex)  Level of Assistance Standby assist, set-up cues, supervision of patient - no hands on  Assistive Device None  Distance Ambulated (ft) 0 ft  Mobility Response Tolerated well  Mobility performed by Mobility specialist  $Mobility charge 1 Mobility    Pre-mobility: 96 HR, 98% SpO2 During mobility: 99 HR, 97% SpO2 Post-mobility: 99 HR, 95% SpO2   Pt was lying in bed upon arrival. Pt agreed to session. Pt declined OOB activity, stating "I'm not getting up." Pt settled on UE exercises, but would not agree to LE workouts with mobility d/t pain. Pt was able to perform exercises: arm raises, crossover reach, long reach, elbow flexion, and crossover stretching with no physical assistance. Pt was left in bed with all needs in reach.    Kathee Delton Mobility Specialist 10/24/19, 3:48 PM

## 2019-10-24 NOTE — Progress Notes (Signed)
[facility]  ROUNDING NOTE   Subjective:   [name] admitted on 10/19/2019 for Peripheral edema [R60.9] Acute CHF (congestive heart failure) ([place]) [I50.9] AKI (acute kidney injury) ([place]) [N17.9] Congestive heart failure, unspecified HF chronicity, unspecified heart failure type ([place]) [I50.9]  Patient still appears fluid overloaded, with 2+ tight bilateral lower extremity edema and anasarca.  She reports shortness of breath, on 3 L supplemental O2 via nasal cannula.  Objective:  Vital signs in last 24 hours:  Temp:  [97.3 F (36.3 C)-98.5 F (36.9 C)] 97.3 F (36.3 C) (10/16 1227) Pulse Rate:  [97-102] 97 (10/16 1227) Resp:  [19-22] 21 (10/16 1227) BP: (111-127)/(83-108) 123/96 (10/16 1227) SpO2:  [100 %] 100 % (10/16 1227) Weight:  [115.6 kg] 115.6 kg (10/15 2224)  Weight change: -0.223 kg Filed Weights   10/22/19 0302 10/23/19 0423 10/23/19 2224  Weight: 114.6 kg 115.8 kg 115.6 kg    Intake/Output: I/O last 3 completed shifts: In: 1256.5 [P.O.:1080; IV Piggyback:176.5] Out: 800 [Urine:800]   Intake/Output this shift:  Total I/O In: 245.3 [P.O.:240; IV Piggyback:5.3] Out: 0   Physical Exam: General:  Lying in bed, in no acute distress  Head: Normocephalic,atraumatic, nasal cannula in place  Eyes:  Anicteric  Neck: Supple, trachea midline  Lungs:   Respirations labored, with accessory muscle use, lungs with crackles bilaterally  Heart: S1S2, no rubs or gallops,regular rate and rhythm  Abdomen:  Soft, nontender, non distended  Extremities: 2+ peripheral edema  Neurologic:  Alert, awake, oriented x3  Skin: No lesions or rashes    Basic Metabolic Panel: Recent Labs  Lab 10/20/19 0356 10/20/19 0356 10/21/19 0553 10/21/19 0553 10/22/19 0518 10/23/19 0450 10/24/19 0545  NA 142  --  139  --  138 139 138  K 4.7  --  4.6  --  4.0 4.0 4.7  CL 110  --  109  --  108 107 103  CO2 23  --  20*  --  21* 23 22  GLUCOSE 118*  --  104*  --  92  130* 117*  BUN 56*  --  59*  --  56* 57* 55*  CREATININE 3.14*  --  2.89*  --  2.66* 2.67* 2.81*  CALCIUM 9.4   < > 9.0   < > 8.7* 8.6* 9.4   < > = values in this interval not displayed.    Liver Function Tests: Recent Labs  Lab 10/23/19 0450 10/24/19 0545  AST 23 25  ALT 15 14  ALKPHOS 119 113  BILITOT 1.4* 2.3*  PROT 6.0* 7.0  ALBUMIN 2.5*  2.5* 3.2*   Recent Labs  Lab 10/23/19 0450 10/24/19 0545  LIPASE 79* 55*   No results for input(s): AMMONIA in the last 168 hours.  CBC: Recent Labs  Lab 10/19/19 0935 10/23/19 0450  WBC 5.7 4.4  HGB 12.2 11.7*  HCT 38.5 36.3  MCV 75.2* 74.4*  PLT 331 302    Cardiac Enzymes: No results for input(s): CKTOTAL, CKMB, CKMBINDEX, TROPONINI in the last 168 hours.  BNP: Invalid input(s): POCBNP  CBG: No results for input(s): GLUCAP in the last 168 hours.  Microbiology: Results for orders placed or performed during the hospital encounter of 10/19/19  Respiratory Panel by RT PCR (Flu A&B, Covid) - Nasopharyngeal Swab     Status: None   Collection Time: 10/19/19 11:13 PM   Specimen: Nasopharyngeal Swab  Result Value Ref Range Status   SARS Coronavirus 2 by RT PCR NEGATIVE NEGATIVE  Final    Comment: (NOTE) SARS-CoV-2 target nucleic acids are NOT DETECTED.  The SARS-CoV-2 RNA is generally detectable in upper respiratoy specimens during the acute phase of infection. The lowest concentration of SARS-CoV-2 viral copies this assay can detect is 131 copies/mL. A negative result does not preclude SARS-Cov-2 infection and should not be used as the sole basis for treatment or other patient management decisions. A negative result may occur with  improper specimen collection/handling, submission of specimen other than nasopharyngeal swab, presence of viral mutation(s) within the areas targeted by this assay, and inadequate number of viral copies (<131 copies/mL). A negative result must be combined with clinical observations,  patient history, and epidemiological information. The expected result is Negative.  Fact Sheet for Patients:  PinkCheek.be  Fact Sheet for Healthcare Providers:  GravelBags.it  This test is no t yet approved or cleared by the [place] FDA and  has been authorized for detection and/or diagnosis of SARS-CoV-2 by FDA under an Emergency Use Authorization (EUA). This EUA will remain  in effect (meaning this test can be used) for the duration of the COVID-19 declaration under Section 564(b)(1) of the Act, 21 U.S.C. section 360bbb-3(b)(1), unless the authorization is terminated or revoked sooner.     Influenza A by PCR NEGATIVE NEGATIVE Final   Influenza B by PCR NEGATIVE NEGATIVE Final    Comment: (NOTE) The Xpert Xpress SARS-CoV-2/FLU/RSV assay is intended as an aid in  the diagnosis of influenza from Nasopharyngeal swab specimens and  should not be used as a sole basis for treatment. Nasal washings and  aspirates are unacceptable for Xpert Xpress SARS-CoV-2/FLU/RSV  testing.  Fact Sheet for Patients: PinkCheek.be  Fact Sheet for Healthcare Providers: GravelBags.it  This test is not yet approved or cleared by the [place] FDA and  has been authorized for detection and/or diagnosis of SARS-CoV-2 by  FDA under an Emergency Use Authorization (EUA). This EUA will remain  in effect (meaning this test can be used) for the duration of the  Covid-19 declaration under Section 564(b)(1) of the Act, 21  U.S.C. section 360bbb-3(b)(1), unless the authorization is  terminated or revoked. Performed at [facility], [address] [place], [place] [address]     Coagulation Studies: No results for input(s): LABPROT, INR in the last 72 hours.  Urinalysis: No results for input(s): COLORURINE, LABSPEC, PHURINE, GLUCOSEU, HGBUR, BILIRUBINUR, KETONESUR,  PROTEINUR, UROBILINOGEN, NITRITE, LEUKOCYTESUR in the last 72 hours.  Invalid input(s): APPERANCEUR    Imaging: US Abdomen Complete  Result Date: 10/24/2019 CLINICAL DATA:  Abdominal pain. Mildly elevated bilirubin and lipase 4 months. EXAM: ABDOMEN ULTRASOUND COMPLETE COMPARISON:  None. FINDINGS: Gallbladder: The gallbladder wall is thickened measuring up to 5.5 mm. No stones, sludge, or Murphy's sign identified. Common bile duct: Diameter: 2.9 mm Liver: Diffuse coarsened echotexture with a nodular contour. No focal mass. Portal vein is patent on color Doppler imaging with normal direction of blood flow towards the liver. IVC: No abnormality visualized. Pancreas: Limited visualization due to bowel gas shadowing. Limited views of the pancreas are normal. Spleen: Size and appearance within normal limits. Right Kidney: Length: 9.7 cm. Contains 2 probable small cysts measuring 1.7 and 1.4 cm. Left Kidney: Length: 10.3 cm. Echogenicity within normal limits. No mass or hydronephrosis visualized. Abdominal aorta: 3.1 cm proximally Other findings: Ascites is seen in all 4 quadrants. Evaluation is limited due to labored breathing. IMPRESSION: 1. The gallbladder wall is thickened which is nonspecific given apparent signs of cirrhosis and obvious  ascites. The gallbladder is otherwise unremarkable. If there is concern for acute cholecystitis, recommend HIDA scan. 2. 4 quadrant ascites, moderate. 3. Coarsened echotexture to the liver with a nodular contour suggesting cirrhosis. 4. Mild prominence of the proximal abdominal aorta measuring 3.1 cm. 5. Small cyst in the right kidney. Electronically Signed   By: Dorise Bullion III M.D   On: 10/24/2019 10:28   DG Chest Port 1 View  Result Date: 10/24/2019 CLINICAL DATA:  64 year old female admitted with heart failure. EXAM: PORTABLE CHEST 1 VIEW COMPARISON:  10/19/2019 FINDINGS: Marked cardiomegaly, unchanged. Cephalization of the pulmonary vasculature. Streaky  bibasilar pulmonary opacities. Blunting of left costophrenic angle. No pneumothorax. No acute osseous abnormality. IMPRESSION: Mild pulmonary edema and probable trace left pleural effusion. Stable cardiomegaly. Electronically Signed   By: Ruthann Cancer MD   On: 10/24/2019 12:00     Medications:   . sodium chloride    . albumin human 12.5 g (10/24/19 1040)  . furosemide (LASIX) infusion 8 mg/hr (10/24/19 1211)   . furosemide  80 mg Intravenous Once  . mouth rinse  15 mL Mouth Rinse BID  . melatonin  5 mg Oral QHS  . polyethylene glycol  17 g Oral Daily  . rivaroxaban  15 mg Oral Daily  . sodium chloride flush  3 mL Intravenous Q12H   sodium chloride, acetaminophen, alum & mag hydroxide-simeth, alum & mag hydroxide-simeth **AND** lidocaine, bisacodyl, Glycerin (Adult), morphine injection, sodium chloride flush  Assessment/ Plan:  [name] is a 64 y.o. black female with congestive heart failure, hypertension, hyperlipidemia, gout, COPD, anemia, cocaine abuse who is admitted to [facility] on 10/19/2019 for Peripheral edema [R60.9] Acute CHF (congestive heart failure) ([place]) [I50.9] AKI (acute kidney injury) ([place]) [N17.9] Congestive heart failure, unspecified HF chronicity, unspecified heart failure type ([place]) [I50.9]  1. Acute kidney injury on chronic kidney disease stage IIIB. Baseline creatinine of 1.66, GFR of 32. With history of proteinuria.  Chronic kidney disease secondary to hypertension  Acute renal failure secondary to acute cardiorenal syndrome  Patient with shortness of breath,anasarca, and bilateral lower extremity edema  Creatinine 2.81 BUN 55 - Holding Entresto -Will start furosemide infusion 8 mg/hr today -IV Albumin reduced to 12.5 mg twice daily   2. Hypotension with acute exacerbation of systolic and diastolic congestive heart failure. Echocardiogram 06/22/19 EF of 25-30%.   -Stays normotensive  - Cardiology team following -Plan to start furosemide infusion  today     LOS: 5 Andrian Sabala 10/16/202112:44 PM

## 2019-10-24 NOTE — Progress Notes (Addendum)
PROGRESS NOTE    Katelyn Lane  XBM:[id number] DOB: [birth year] DOA: 10/19/2019 PCP: [name], MD   Brief Narrative: Taken from H&P and chart review. Katelyn Lane is a 64 y.o. 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 Found to have BNP greater than 4500, elevated troponin and AKI.  Multiple prior admissions with similar symptoms and complaints.  Prolonged hospitalization in August 2021 when she was discharged to SNF.  Not clear when she get out of SNF.  Continues to complain that she is homeless but per social worker she do have a house.  Not take her medication despite being provided with medical management.   Subjective: Complaining of diffuse abdominal pain, says does not localize. No vomiting, no fevers, no diarrhea. Gi cocktail helped somewhat. Denies chest pain or sob.   Assessment & Plan:   Principal Problem:   Acute CHF (congestive heart failure) (HCC) Active Problems:   AKI (acute kidney injury) ([place])   Crack cocaine use   Acute respiratory failure with hypoxia (HCC)   LV (left ventricular) mural thrombus   Cocaine abuse ([place])   Peripheral edema   Cirrhosis of liver (HCC)  Acute on chronic combined systolic and diastolic heart failure.  Patient with EF of 25 to 30% and grade 3 diastolic dysfunction on echocardiogram done in June 2021.  Multiple admissions for similar symptoms and being noncompliant.  Continue to use cocaine. Mild trop elevation, likely demand.  -yesterday trialed transition to metolazone with albumin infusion, today increased sob think likely fluid overload (see below) - lasix 80 mg IV once and then gtt ordered, albumin infusion also ordered by nephrology -Continue to monitor daily weight and  BMP. -Strict intake and output. Only 200 cc of urine recorded yesterday - hyolding entresto and bb until bp stable - cardiology following, appreciate recs - little uop this morning, nursing reported bladder scan of 800, I advised foley so as to also monitor uop accurately, on insertion only 45 ml to bag thus think bladder scan was erroneous  Cirrhosis Abdominal pain -yesterday complaining of diffuse abd pain, no other symptoms. Labs showed no elevated WBCs, chronically elevated bili. Mild lipase elevation, slightly improved today. U/s yesterday shows mod ascites, non-specific gallbladder wall thickening, cirrhotic liver. Nodular liver was seen on u/s of august of this year. Suspect ascites consequence of some combination of chf and cirrhosis. Cirrhosis may be 2/2 nafld, possible alcohol use. Hep b negative on recent labs - will need ascitic fluid sampling once respiratory status is stabilized - f/u hcv antibody, iron panel - likely continued outpt w/u  Acute hypoxic respiratory failure - requiring 6 L on admission, o2 off oxygen was 89%, now weaned to 3 L, likely 2/2 above cardiac process. This morning increased wob, likely secondary to fluid overload - lasix 80 mg IV once then gtt, albumin infusion - f/u cxr, appears to have pulmonary edema  Hypotension - resolved, off midodrine - diuresis as above - holding bb and entresto as above  AKI with CKD stage IIIb/IV. Likely cardiorenal syndrome. Creatinine about 3, baseline around 2. Cr stable at 2.81 today, slight worsening from yesterday. U/s showing medical renal disease. -Continue with diuresis. -nephrology following, appreciate recs. Albumin infusion today, lasix gtt. If does not respond may need dialysis -Continue to monitor renal function. -Avoid nephrotoxins.  Cocaine use.  According to H&P last use was 2 to 3 days prior to admission.  UDS positive for cocaine.  Patient refused to answer any questions regarding her cocaine use .  This  will complicate overall prognosis and care.  History of ventricular thrombus in 01/2019 and hx of CVA Continue Xarelto  Social issues.  Apparently she was not competent enough to make her own decisions according to the psych evaluation done during recent hospitalization. -will involve psychiatry; social work will also attempt to contact patient's APS caseworker   Objective: Vitals:   10/23/19 1946 10/23/19 2224 10/24/19 0639 10/24/19 0907  BP: (!) 121/96 121/85 (!) 127/108 111/83  Pulse: 98 (!) 102 (!) 102 98  Resp: 20 (!) 22 20 19   Temp: 97.7 F (36.5 C) 98.5 F (36.9 C) (!) 97.5 F (36.4 C) 97.9 F (36.6 C)  TempSrc: Oral Oral  Oral  SpO2: 100% 100% 100% 100%  Weight:  115.6 kg    Height:        Intake/Output Summary (Last 24 hours) at 10/24/2019 1141 Last data filed at 10/24/2019 1040 Gross per 24 hour  Intake 1141.75 ml  Output 200 ml  Net 941.75 ml   Filed Weights   10/22/19 0302 10/23/19 0423 10/23/19 2224  Weight: 114.6 kg 115.8 kg 115.6 kg    Examination:  General exam: sitting up, tachypnic Respiratory system: scattered exp wheezes, tachypnic Cardiovascular system: S1 & S2 heard, RRR. Soft systolic murmur. Distant heart sounds Gastrointestinal system: Soft, diffuse mild ttp, no rebound Central nervous system: Alert and oriented. No focal neurological deficits. Extremities: pitting edema to thighs Psychiatry: Judgement and insight appear impaired.   DVT prophylaxis: Xarelto Code Status: Full Family Communication: none at bedside Disposition Plan:  Status is: Inpatient  Remains inpatient appropriate because:Inpatient level of care appropriate due to severity of illness   Dispo: The patient is from: Home/homeless              Anticipated d/c is to: To be determined              Anticipated d/c date is: 4-7 days              Patient currently is not medically stable to d/c.   Consultants:   Nephrology, cardiology  Procedures: renal  u/s Antimicrobials: none  Data Reviewed: I have personally reviewed following labs and imaging studies  CBC: Recent Labs  Lab 10/19/19 0935 10/23/19 0450  WBC 5.7 4.4  HGB 12.2 11.7*  HCT 38.5 36.3  MCV 75.2* 74.4*  PLT 331 497   Basic Metabolic Panel: Recent Labs  Lab 10/20/19 0356 10/21/19 0553 10/22/19 0518 10/23/19 0450 10/24/19 0545  NA 142 139 138 139 138  K 4.7 4.6 4.0 4.0 4.7  CL 110 109 108 107 103  CO2 23 20* 21* 23 22  GLUCOSE 118* 104* 92 130* 117*  BUN 56* 59* 56* 57* 55*  CREATININE 3.14* 2.89* 2.66* 2.67* 2.81*  CALCIUM 9.4 9.0 8.7* 8.6* 9.4   GFR: Estimated Creatinine Clearance: 26.5 mL/min (A) (by C-G formula based on SCr of 2.81 mg/dL (H)). Liver Function Tests: Recent Labs  Lab 10/23/19 0450 10/24/19 0545  AST 23 25  ALT 15 14  ALKPHOS 119 113  BILITOT 1.4* 2.3*  PROT 6.0* 7.0  ALBUMIN 2.5*  2.5* 3.2*   Recent Labs  Lab 10/23/19 0450 10/24/19 0545  LIPASE 79* 55*   No results for input(s): AMMONIA in the last 168 hours. Coagulation Profile:  No results for input(s): INR, PROTIME in the last 168 hours. Cardiac Enzymes: No results for input(s): CKTOTAL, CKMB, CKMBINDEX, TROPONINI in the last 168 hours. BNP (last 3 results) No results for input(s): PROBNP in the last 8760 hours. HbA1C: No results for input(s): HGBA1C in the last 72 hours. CBG: No results for input(s): GLUCAP in the last 168 hours. Lipid Profile: No results for input(s): CHOL, HDL, LDLCALC, TRIG, CHOLHDL, LDLDIRECT in the last 72 hours. Thyroid Function Tests: No results for input(s): TSH, T4TOTAL, FREET4, T3FREE, THYROIDAB in the last 72 hours. Anemia Panel: No results for input(s): VITAMINB12, FOLATE, FERRITIN, TIBC, IRON, RETICCTPCT in the last 72 hours. Sepsis Labs: No results for input(s): PROCALCITON, LATICACIDVEN in the last 168 hours.  Recent Results (from the past 240 hour(s))  Respiratory Panel by RT PCR (Flu A&B, Covid) - Nasopharyngeal Swab      Status: None   Collection Time: 10/19/19 11:13 PM   Specimen: Nasopharyngeal Swab  Result Value Ref Range Status   SARS Coronavirus 2 by RT PCR NEGATIVE NEGATIVE Final    Comment: (NOTE) SARS-CoV-2 target nucleic acids are NOT DETECTED.  The SARS-CoV-2 RNA is generally detectable in upper respiratoy specimens during the acute phase of infection. The lowest concentration of SARS-CoV-2 viral copies this assay can detect is 131 copies/mL. A negative result does not preclude SARS-Cov-2 infection and should not be used as the sole basis for treatment or other patient management decisions. A negative result may occur with  improper specimen collection/handling, submission of specimen other than nasopharyngeal swab, presence of viral mutation(s) within the areas targeted by this assay, and inadequate number of viral copies (<131 copies/mL). A negative result must be combined with clinical observations, patient history, and epidemiological information. The expected result is Negative.  Fact Sheet for Patients:  PinkCheek.be  Fact Sheet for Healthcare Providers:  GravelBags.it  This test is no t yet approved or cleared by the [place] FDA and  has been authorized for detection and/or diagnosis of SARS-CoV-2 by FDA under an Emergency Use Authorization (EUA). This EUA will remain  in effect (meaning this test can be used) for the duration of the COVID-19 declaration under Section 564(b)(1) of the Act, 21 U.S.C. section 360bbb-3(b)(1), unless the authorization is terminated or revoked sooner.     Influenza A by PCR NEGATIVE NEGATIVE Final   Influenza B by PCR NEGATIVE NEGATIVE Final    Comment: (NOTE) The Xpert Xpress SARS-CoV-2/FLU/RSV assay is intended as an aid in  the diagnosis of influenza from Nasopharyngeal swab specimens and  should not be used as a sole basis for treatment. Nasal washings and  aspirates are  unacceptable for Xpert Xpress SARS-CoV-2/FLU/RSV  testing.  Fact Sheet for Patients: PinkCheek.be  Fact Sheet for Healthcare Providers: GravelBags.it  This test is not yet approved or cleared by the [place] FDA and  has been authorized for detection and/or diagnosis of SARS-CoV-2 by  FDA under an Emergency Use Authorization (EUA). This EUA will remain  in effect (meaning this test can be used) for the duration of the  Covid-19 declaration under Section 564(b)(1) of the Act, 21  U.S.C. section 360bbb-3(b)(1), unless the authorization is  terminated or revoked. Performed at [facility], [address], [place], [place] [address]      Radiology Studies: US Abdomen Complete  Result Date: 10/24/2019 CLINICAL DATA:  Abdominal pain. Mildly elevated bilirubin and lipase 4 months. EXAM: ABDOMEN ULTRASOUND COMPLETE COMPARISON:  None. FINDINGS: Gallbladder: The gallbladder wall is  thickened measuring up to 5.5 mm. No stones, sludge, or Murphy's sign identified. Common bile duct: Diameter: 2.9 mm Liver: Diffuse coarsened echotexture with a nodular contour. No focal mass. Portal vein is patent on color Doppler imaging with normal direction of blood flow towards the liver. IVC: No abnormality visualized. Pancreas: Limited visualization due to bowel gas shadowing. Limited views of the pancreas are normal. Spleen: Size and appearance within normal limits. Right Kidney: Length: 9.7 cm. Contains 2 probable small cysts measuring 1.7 and 1.4 cm. Left Kidney: Length: 10.3 cm. Echogenicity within normal limits. No mass or hydronephrosis visualized. Abdominal aorta: 3.1 cm proximally Other findings: Ascites is seen in all 4 quadrants. Evaluation is limited due to labored breathing. IMPRESSION: 1. The gallbladder wall is thickened which is nonspecific given apparent signs of cirrhosis and obvious ascites. The gallbladder is otherwise  unremarkable. If there is concern for acute cholecystitis, recommend HIDA scan. 2. 4 quadrant ascites, moderate. 3. Coarsened echotexture to the liver with a nodular contour suggesting cirrhosis. 4. Mild prominence of the proximal abdominal aorta measuring 3.1 cm. 5. Small cyst in the right kidney. Electronically Signed   By: Dorise Bullion III M.D   On: 10/24/2019 10:28    Scheduled Meds: . furosemide  80 mg Intravenous Once  . mouth rinse  15 mL Mouth Rinse BID  . melatonin  5 mg Oral QHS  . polyethylene glycol  17 g Oral Daily  . rivaroxaban  15 mg Oral Daily  . sodium chloride flush  3 mL Intravenous Q12H   Continuous Infusions: . sodium chloride    . albumin human 12.5 g (10/24/19 1040)  . furosemide (LASIX) infusion       LOS: 5 days   Time spent: 30 minutes.  I personally reviewed her chart.  Desma Maxim, MD [facility]  If 7PM-7AM, please contact night-coverage [email or web address]  10/24/2019, 11:41 AM

## 2019-10-24 NOTE — Progress Notes (Addendum)
After 3 hours on lasix gtt @ 8 mg/hr and after received bolus of 80 mg IV, uop about 100 ml at 15:00, increased gtt to 20 mg/hr. cxr with pulmonary edema consistent w/ fluid overload. Remains tachypneic. 2 hours later minimal additional urine output, will increase gtt to max of 40. If no response consider addition of thiazide vs HD.

## 2019-10-24 NOTE — Progress Notes (Signed)
Patient Name: Katelyn Lane Date of Encounter: 10/24/2019  Hospital Problem List     Principal Problem:   Acute CHF (congestive heart failure) ([place]) Active Problems:   AKI (acute kidney injury) ([place])   Crack cocaine use   Acute respiratory failure with hypoxia (HCC)   LV (left ventricular) mural thrombus   Cocaine abuse ([place])   Peripheral edema   Cirrhosis of liver [facility])    Patient Profile     Katelyn Lane is a 64 year old female with PMH significant for chronic combined systolic HF (LP=37-90% Echo 06/2019), h/o CVA, ventricular thrombus (on Xarelto), HTN, HLD, CKD Stage IIIb and cocaine use who was admitted for CHF exacerbation thus cardiology was consulted.    Subjective   States she is more sob today.  Complains of lower abdominal pain.  Inpatient Medications    . furosemide  80 mg Intravenous Once  . mouth rinse  15 mL Mouth Rinse BID  . melatonin  5 mg Oral QHS  . polyethylene glycol  17 g Oral Daily  . rivaroxaban  15 mg Oral Daily  . sodium chloride flush  3 mL Intravenous Q12H    Vital Signs    Vitals:   10/23/19 1946 10/23/19 2224 10/24/19 0639 10/24/19 0907  BP: (!) 121/96 121/85 (!) 127/108 111/83  Pulse: 98 (!) 102 (!) 102 98  Resp: 20 (!) 22 20 19   Temp: 97.7 F (36.5 C) 98.5 F (36.9 C) (!) 97.5 F (36.4 C) 97.9 F (36.6 C)  TempSrc: Oral Oral  Oral  SpO2: 100% 100% 100% 100%  Weight:  115.6 kg    Height:        Intake/Output Summary (Last 24 hours) at 10/24/2019 1145 Last data filed at 10/24/2019 1040 Gross per 24 hour  Intake 1141.75 ml  Output 200 ml  Net 941.75 ml   Filed Weights   10/22/19 0302 10/23/19 0423 10/23/19 2224  Weight: 114.6 kg 115.8 kg 115.6 kg    Physical Exam    GEN: Well nourished, well developed, in no acute distress.  HEENT: normal.  Neck: Supple, no JVD, carotid bruits, or masses. Cardiac: RRR, no murmurs, rubs, or gallops. Lower extremety edema Respiratory:  Respirations regular and unlabored, clear to  auscultation bilaterally. GI: Soft, nontender, nondistended, BS + x 4. MS: no deformity or atrophy. Skin: warm and dry, no rash. Neuro:  Somewhat slow to respond.   Labs    CBC Recent Labs    10/23/19 0450  WBC 4.4  HGB 11.7*  HCT 36.3  MCV 74.4*  PLT 240   Basic Metabolic Panel Recent Labs    10/23/19 0450 10/24/19 0545  NA 139 138  K 4.0 4.7  CL 107 103  CO2 23 22  GLUCOSE 130* 117*  BUN 57* 55*  CREATININE 2.67* 2.81*  CALCIUM 8.6* 9.4   Liver Function Tests Recent Labs    10/23/19 0450 10/24/19 0545  AST 23 25  ALT 15 14  ALKPHOS 119 113  BILITOT 1.4* 2.3*  PROT 6.0* 7.0  ALBUMIN 2.5*  2.5* 3.2*   Recent Labs    10/23/19 0450 10/24/19 0545  LIPASE 79* 55*   Cardiac Enzymes No results for input(s): CKTOTAL, CKMB, CKMBINDEX, TROPONINI in the last 72 hours. BNP No results for input(s): BNP in the last 72 hours. D-Dimer No results for input(s): DDIMER in the last 72 hours. Hemoglobin A1C No results for input(s): HGBA1C in the last 72 hours. Fasting Lipid Panel No results for input(s): CHOL,  HDL, LDLCALC, TRIG, CHOLHDL, LDLDIRECT in the last 72 hours. Thyroid Function Tests No results for input(s): TSH, T4TOTAL, T3FREE, THYROIDAB in the last 72 hours.  Invalid input(s): FREET3  Telemetry    nsr  ECG    No ischemia  Radiology    DG Chest 2 View  Result Date: 10/19/2019 CLINICAL DATA:  Shortness of breath. EXAM: CHEST - 2 VIEW COMPARISON:  08/27/2019 and chest CT 08/15/2019 FINDINGS: Stable cardiomegaly. No overt pulmonary edema. Difficult to exclude densities at the left lung base and small left pleural effusion. Negative for a pneumothorax. IMPRESSION: Stable cardiomegaly with probable left basilar chest densities. Difficult to exclude a small left pleural effusion. Minimal change from the exam on 08/27/2019. Electronically Signed   By: Markus Daft M.D.   On: 10/19/2019 09:42   US Abdomen Complete  Result Date: 10/24/2019 CLINICAL  DATA:  Abdominal pain. Mildly elevated bilirubin and lipase 4 months. EXAM: ABDOMEN ULTRASOUND COMPLETE COMPARISON:  None. FINDINGS: Gallbladder: The gallbladder wall is thickened measuring up to 5.5 mm. No stones, sludge, or Murphy's sign identified. Common bile duct: Diameter: 2.9 mm Liver: Diffuse coarsened echotexture with a nodular contour. No focal mass. Portal vein is patent on color Doppler imaging with normal direction of blood flow towards the liver. IVC: No abnormality visualized. Pancreas: Limited visualization due to bowel gas shadowing. Limited views of the pancreas are normal. Spleen: Size and appearance within normal limits. Right Kidney: Length: 9.7 cm. Contains 2 probable small cysts measuring 1.7 and 1.4 cm. Left Kidney: Length: 10.3 cm. Echogenicity within normal limits. No mass or hydronephrosis visualized. Abdominal aorta: 3.1 cm proximally Other findings: Ascites is seen in all 4 quadrants. Evaluation is limited due to labored breathing. IMPRESSION: 1. The gallbladder wall is thickened which is nonspecific given apparent signs of cirrhosis and obvious ascites. The gallbladder is otherwise unremarkable. If there is concern for acute cholecystitis, recommend HIDA scan. 2. 4 quadrant ascites, moderate. 3. Coarsened echotexture to the liver with a nodular contour suggesting cirrhosis. 4. Mild prominence of the proximal abdominal aorta measuring 3.1 cm. 5. Small cyst in the right kidney. Electronically Signed   By: Dorise Bullion III M.D   On: 10/24/2019 10:28   US RENAL  Result Date: 10/20/2019 CLINICAL DATA:  64 year old female with acute renal failure. EXAM: RENAL / URINARY TRACT ULTRASOUND COMPLETE COMPARISON:  CT abdomen pelvis dated 08/15/2019. FINDINGS: Right Kidney: Renal measurements: 10.9 x 4.6 x 4.9 cm = volume: 127 mL. There is mild diffuse increased echogenicity. No hydronephrosis or shadowing stone. There is a 1.7 x 1.1 x 1.1 cm lateral interpolar cyst. Left Kidney: Renal  measurements: 10.1 x 4.8 x 5.3 cm = volume: 132 mL. Mild increased echogenicity. No hydronephrosis or shadowing stone. Bladder: The bladder is partially visualized. Other: There is a small ascites and anasarca. IMPRESSION: 1. Increased renal parenchymal echogenicity in keeping with chronic kidney disease. No hydronephrosis or shadowing stone. 2. Small ascites. Electronically Signed   By: Anner Crete M.D.   On: 10/20/2019 17:59    Assessment & Plan    Principal Problem:   Acute CHF (congestive heart failure) (HCC) Active Problems:   AKI (acute kidney injury) ([place])   Crack cocaine use   Acute respiratory failure with hypoxia (HCC)   LV (left ventricular) mural thrombus   Cocaine abuse ([place])   Peripheral edema    ASSESSMENT AND PLAN: 1. Acute on Chronic CHF exacerbation (EF =25-30% on Echo), uncompensated, fairly stable at this time -BNP >4500 -High  Sensitivity troponin=106>>156; likely due to demand ischemia.  -Lasix drip started. Will follow.  -Hold Entresto at this time due to hypotension and acute on chronic CKD stage IIIb. Consider restarting as outpatient.  -Trend BMP.  -Hold beta blocker until blood pressure is stabilized. Consider restarting as outpatient.  -1500cc FS. -Strict I&O's -Daily weights - Recommend continuous telemetry monitoring but patient refuses  -low sodium, heart healthy diet  2. Acute hypoxic respiratory failure, stable  -Continue HF management as stated above. -Agree with supplemental oxygen therapy. Wean as tolerated.              -RT consult appreciated.   3. Acute on Chronic CKD Stage IIIb, fairly stable -Nephrology input appreciated. Will follow on lasix drip.l Metolazone stopped.  -We will continue to hold Entresto at this time.  -Monitor  BUN/Creatinine which appears to be improving some  4. H/o Ventricular thrombus, stable -patient on xarelto; continue.              -follow bleeding precautions.   5. Hypotension, resolved, patient is normotensive at this time -monitor bp q4h.  -hold antihypertensives at this time. Consider restarting as outpatient.  6. HLD, stable -lipid profile from 05/22/19 is within reference range with a total cholesterol=100, HDL=46, LDL=43 and Triglycerides=56 -continue diet management.   5. Cocaine use             -psychiatric evaluation appreciated.  -Recommend cocaine cessation. -Recommend social work consult for sociological issues (homelessness).   Signed, Javier Docker Cailee Blanke MD 10/24/2019, 11:45 AM  Pager: [phone]

## 2019-10-24 NOTE — Progress Notes (Addendum)
Anticoagulation Monitoring   Pt on Xarelto for hx of ventricular thrombus.  Pt on Xarelto 20 mg PO daily PTA.  Wt 115.6 kg SCr 2.81 - Actual body weight CrCl ~37 ml/min   Based on indication and CrCl>30 ml/min, will adjust dose to Xarelto 20 mg daily with supper starting tomorrow.

## 2019-10-24 NOTE — Progress Notes (Addendum)
Checked urine output in foley-  Only  100 ml noted post insertion.   Patient has had 80 mg IV push lasix along with IV drip.    MD increased to 40 ml (2nd increase) since starting

## 2019-10-24 NOTE — Progress Notes (Addendum)
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  Ordered acute abdominal series, blood work, Production designer, theatre/television/film.  May order NG tube based on imaging report.  History of cirrhosis so hesitant to place NG.  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 -Blood work and acute abdominal series ordered and results reviewed -Hemoglobin reassuring -Continue to monitor H&H -Xarelto being held.  Pharmacy consult for replacement with heparin infusion at time of next Xarelto dose if GI bleed ruled out and patient stable -Continue to monitor  Hypotension in the setting of systolic heart failure exacerbation and anasarca  -Lasix infusion decreased to 10 mg /h, though patient still having very poor urine output -May need to consider dialysis for fluid removal  CRITICAL CARE Performed by: Athena Masse   Total critical care time:55 minutes  Critical care time was exclusive of separately billable procedures and treating other patients.  Critical care was necessary to treat or prevent imminent or life-threatening deterioration.  Critical care was time spent personally by me on the  following activities: development of treatment plan with patient and/or surrogate as well as nursing, discussions with consultants, evaluation of patient's response to treatment, examination of patient, obtaining history from patient or surrogate, ordering and performing treatments and interventions, ordering and review of laboratory studies, ordering and review of radiographic studies, pulse oximetry and re-evaluation of patient's condition.    Principal Problem:   Acute CHF (congestive heart failure) (HCC) Active Problems:   AKI (acute kidney injury) ([place])   Crack cocaine use   Acute respiratory failure with hypoxia (HCC)   LV (left ventricular) mural thrombus   Cocaine abuse (HCC)   Peripheral edema   Cirrhosis of liver (HCC)  Objective: Vitals:   10/24/19 1227 10/24/19 1637 10/24/19 2135 10/24/19 2324  BP: (!) 123/96 (!) 121/95 104/90 (!) 106/52  Pulse: 97 97 86 84  Resp: (!) 21 20 20 20   Temp: (!) 97.3 F (36.3 C) 97.9 F (36.6 C) 98 F (36.7 C) (!) 97.5 F (36.4 C)  TempSrc: Oral   Oral  SpO2: 100% 99% 99% 99%  Weight:      Height:        Intake/Output Summary (Last 24 hours) at 10/24/2019 2354 Last data filed at 10/24/2019 1925 Gross per 24 hour  Intake 1221.89 ml  Output 0 ml  Net 1221.89 ml   Filed Weights   10/22/19 0302 10/23/19 0423 10/23/19 2224  Weight: 114.6 kg 115.8 kg 115.6 kg    Examination:  General exam: Sleeping but arouses easily with gentle shaking.  Tachypnea with increased work of breathing Respiratory system: Wheezes, tachypnea, unable to speak in full sentences Cardiovascular system: S1 & S2 heard, + JVD, murmurs, 3+ pitting edema  gastrointestinal system: Abdominal wall edema  Central nervous system: Alert and oriented when awakened extremities: Symmetric 5 x 5 power.     Data Reviewed: I have personally reviewed following labs and imaging studies  CBC: Recent Labs  Lab 10/19/19 0935 10/23/19 0450  WBC 5.7 4.4  HGB 12.2 11.7*   HCT 38.5 36.3  MCV 75.2* 74.4*  PLT 331 010   Basic Metabolic Panel: Recent Labs  Lab 10/20/19 0356 10/21/19 0553 10/22/19 0518 10/23/19 0450 10/24/19 0545  NA 142 139 138 139 138  K 4.7 4.6 4.0 4.0 4.7  CL 110 109 108 107 103  CO2 23 20* 21* 23 22  GLUCOSE 118* 104* 92 130* 117*  BUN 56* 59* 56* 57* 55*  CREATININE 3.14* 2.89* 2.66* 2.67* 2.81*  CALCIUM 9.4 9.0 8.7* 8.6* 9.4   GFR: Estimated Creatinine Clearance: 26.5 mL/min (A) (by C-G formula based on SCr of 2.81 mg/dL (H)). Liver Function Tests: Recent Labs  Lab 10/23/19 0450 10/24/19 0545  AST 23 25  ALT 15 14  ALKPHOS 119 113  BILITOT 1.4* 2.3*  PROT 6.0* 7.0  ALBUMIN 2.5*  2.5* 3.2*   Recent Labs  Lab 10/23/19 0450 10/24/19 0545  LIPASE 79* 55*   No results for input(s): AMMONIA in the last 168 hours. Coagulation Profile: No results for input(s): INR, PROTIME in the last 168 hours. Cardiac Enzymes: No results for input(s): CKTOTAL, CKMB, CKMBINDEX, TROPONINI in the last 168 hours. BNP (last 3 results) No results for input(s): PROBNP in the last 8760 hours. HbA1C: No results for input(s): HGBA1C in the last 72 hours. CBG: No results for input(s): GLUCAP in the last 168 hours. Lipid Profile: No results for input(s): CHOL, HDL, LDLCALC, TRIG, CHOLHDL, LDLDIRECT in the last 72 hours. Thyroid Function Tests: No results for input(s): TSH, T4TOTAL, FREET4, T3FREE, THYROIDAB in the last 72 hours. Anemia Panel: No results for input(s): VITAMINB12, FOLATE, FERRITIN, TIBC, IRON, RETICCTPCT in the last 72 hours. Sepsis Labs: No results for input(s): PROCALCITON, LATICACIDVEN in the last 168 hours.  Recent Results (from the past 240 hour(s))  Respiratory Panel by RT PCR (Flu A&B, Covid) - Nasopharyngeal Swab     Status: None   Collection Time: 10/19/19 11:13 PM   Specimen: Nasopharyngeal Swab  Result Value Ref Range Status   SARS Coronavirus 2 by RT PCR NEGATIVE NEGATIVE Final    Comment:  (NOTE) SARS-CoV-2 target nucleic acids are NOT DETECTED.  The SARS-CoV-2 RNA is generally detectable in upper respiratoy specimens during the acute phase of infection. The lowest concentration of SARS-CoV-2 viral copies this assay can detect is 131 copies/mL. A negative result does not preclude SARS-Cov-2 infection and should not be used as the sole basis for treatment or other patient management decisions. A negative result may occur with  improper specimen collection/handling, submission of specimen other than nasopharyngeal swab, presence of viral mutation(s) within the areas targeted by this assay, and inadequate number of viral copies (<131 copies/mL). A negative result must be combined with clinical observations, patient history, and epidemiological information. The expected result is Negative.  Fact Sheet for Patients:  PinkCheek.be  Fact Sheet for Healthcare Providers:  GravelBags.it  This test is no t yet approved or cleared by the [place] FDA and  has been authorized for detection and/or diagnosis of SARS-CoV-2 by FDA under an Emergency Use Authorization (EUA). This EUA will remain  in effect (meaning this test can be used) for the duration of the COVID-19 declaration under Section  564(b)(1) of the Act, 21 U.S.C. section 360bbb-3(b)(1), unless the authorization is terminated or revoked sooner.     Influenza A by PCR NEGATIVE NEGATIVE Final   Influenza B by PCR NEGATIVE NEGATIVE Final    Comment: (NOTE) The Xpert Xpress SARS-CoV-2/FLU/RSV assay is intended as an aid in  the diagnosis of influenza from Nasopharyngeal swab specimens and  should not be used as a sole basis for treatment. Nasal washings and  aspirates are unacceptable for Xpert Xpress SARS-CoV-2/FLU/RSV  testing.  Fact Sheet for Patients: PinkCheek.be  Fact Sheet for Healthcare  Providers: GravelBags.it  This test is not yet approved or cleared by the [place] FDA and  has been authorized for detection and/or diagnosis of SARS-CoV-2 by  FDA under an Emergency Use Authorization (EUA). This EUA will remain  in effect (meaning this test can be used) for the duration of the  Covid-19 declaration under Section 564(b)(1) of the Act, 21  U.S.C. section 360bbb-3(b)(1), unless the authorization is  terminated or revoked. Performed at [facility], [address] [place], [place] [address]          Radiology Studies: US Abdomen Complete  Result Date: 10/24/2019 CLINICAL DATA:  Abdominal pain. Mildly elevated bilirubin and lipase 4 months. EXAM: ABDOMEN ULTRASOUND COMPLETE COMPARISON:  None. FINDINGS: Gallbladder: The gallbladder wall is thickened measuring up to 5.5 mm. No stones, sludge, or Murphy's sign identified. Common bile duct: Diameter: 2.9 mm Liver: Diffuse coarsened echotexture with a nodular contour. No focal mass. Portal vein is patent on color Doppler imaging with normal direction of blood flow towards the liver. IVC: No abnormality visualized. Pancreas: Limited visualization due to bowel gas shadowing. Limited views of the pancreas are normal. Spleen: Size and appearance within normal limits. Right Kidney: Length: 9.7 cm. Contains 2 probable small cysts measuring 1.7 and 1.4 cm. Left Kidney: Length: 10.3 cm. Echogenicity within normal limits. No mass or hydronephrosis visualized. Abdominal aorta: 3.1 cm proximally Other findings: Ascites is seen in all 4 quadrants. Evaluation is limited due to labored breathing. IMPRESSION: 1. The gallbladder wall is thickened which is nonspecific given apparent signs of cirrhosis and obvious ascites. The gallbladder is otherwise unremarkable. If there is concern for acute cholecystitis, recommend HIDA scan. 2. 4 quadrant ascites, moderate. 3. Coarsened echotexture to the liver with a  nodular contour suggesting cirrhosis. 4. Mild prominence of the proximal abdominal aorta measuring 3.1 cm. 5. Small cyst in the right kidney. Electronically Signed   By: Dorise Bullion III M.D   On: 10/24/2019 10:28   DG Chest Port 1 View  Result Date: 10/24/2019 CLINICAL DATA:  64 year old female admitted with heart failure. EXAM: PORTABLE CHEST 1 VIEW COMPARISON:  10/19/2019 FINDINGS: Marked cardiomegaly, unchanged. Cephalization of the pulmonary vasculature. Streaky bibasilar pulmonary opacities. Blunting of left costophrenic angle. No pneumothorax. No acute osseous abnormality. IMPRESSION: Mild pulmonary edema and probable trace left pleural effusion. Stable cardiomegaly. Electronically Signed   By: Ruthann Cancer MD   On: 10/24/2019 12:00        Scheduled Meds: . Chlorhexidine Gluconate Cloth  6 each Topical Daily  . mouth rinse  15 mL Mouth Rinse BID  . melatonin  5 mg Oral QHS  . polyethylene glycol  17 g Oral Daily  . sodium chloride flush  3 mL Intravenous Q12H   Continuous Infusions: . sodium chloride    . albumin human 12.5 g (10/24/19 2244)  . furosemide (LASIX) infusion 40 mg/hr (10/24/19 2328)  . [START ON 10/25/2019]  pantoprazole (PROTONIX) IVPB       LOS: 5 days    Time spent: [place], MD 10/24/2019, 11:54 PM

## 2019-10-24 NOTE — Progress Notes (Signed)
Patient has not urinated this shift.   Bladder scan revealed 833 ml urine.    Paged MD-

## 2019-10-25 DIAGNOSIS — I5041 Acute combined systolic (congestive) and diastolic (congestive) heart failure: Secondary | ICD-10-CM | POA: Diagnosis not present

## 2019-10-25 LAB — COMPREHENSIVE METABOLIC PANEL
ALT: 10 U/L (ref 0–44)
AST: 17 U/L (ref 15–41)
Albumin: 3 g/dL — ABNORMAL LOW (ref 3.5–5.0)
Alkaline Phosphatase: 88 U/L (ref 38–126)
Anion gap: 11 (ref 5–15)
BUN: 61 mg/dL — ABNORMAL HIGH (ref 8–23)
CO2: 22 mmol/L (ref 22–32)
Calcium: 9.2 mg/dL (ref 8.9–10.3)
Chloride: 105 mmol/L (ref 98–111)
Creatinine, Ser: 3.25 mg/dL — ABNORMAL HIGH (ref 0.44–1.00)
GFR, Estimated: 14 mL/min — ABNORMAL LOW (ref >60)
Glucose, Bld: 119 mg/dL — ABNORMAL HIGH (ref 70–99)
Potassium: 5 mmol/L (ref 3.5–5.1)
Sodium: 138 mmol/L (ref 135–145)
Total Bilirubin: 2.2 mg/dL — ABNORMAL HIGH (ref 0.3–1.2)
Total Protein: 6.1 g/dL — ABNORMAL LOW (ref 6.5–8.1)

## 2019-10-25 LAB — HEPATITIS B CORE ANTIBODY, TOTAL: Hep B Core Total Ab: NONREACTIVE

## 2019-10-25 LAB — CBC
HCT: 41.6 % (ref 36.0–46.0)
HCT: 42.3 % (ref 36.0–46.0)
Hemoglobin: 13.2 g/dL (ref 12.0–15.0)
Hemoglobin: 13.4 g/dL (ref 12.0–15.0)
MCH: 23.6 pg — ABNORMAL LOW (ref 26.0–34.0)
MCH: 24.1 pg — ABNORMAL LOW (ref 26.0–34.0)
MCHC: 31.7 g/dL (ref 30.0–36.0)
MCHC: 31.7 g/dL (ref 30.0–36.0)
MCV: 74.4 fL — ABNORMAL LOW (ref 80.0–100.0)
MCV: 75.9 fL — ABNORMAL LOW (ref 80.0–100.0)
Platelets: 302 10*3/uL (ref 150–400)
Platelets: 307 10*3/uL (ref 150–400)
RBC: 5.57 MIL/uL — ABNORMAL HIGH (ref 3.87–5.11)
RBC: 5.59 MIL/uL — ABNORMAL HIGH (ref 3.87–5.11)
RDW: 23.6 % — ABNORMAL HIGH (ref 11.5–15.5)
RDW: 23.8 % — ABNORMAL HIGH (ref 11.5–15.5)
WBC: 10.5 10*3/uL (ref 4.0–10.5)
WBC: 10.5 10*3/uL (ref 4.0–10.5)
nRBC: 0 % (ref 0.0–0.2)
nRBC: 0.2 % (ref 0.0–0.2)

## 2019-10-25 LAB — IRON AND TIBC
Iron: 30 ug/dL (ref 28–170)
Saturation Ratios: 17 % (ref 10.4–31.8)
TIBC: 172 ug/dL — ABNORMAL LOW (ref 250–450)
UIBC: 142 ug/dL

## 2019-10-25 LAB — HEPATITIS C ANTIBODY: HCV Ab: NONREACTIVE

## 2019-10-25 LAB — BASIC METABOLIC PANEL
Anion gap: 10 (ref 5–15)
BUN: 61 mg/dL — ABNORMAL HIGH (ref 8–23)
CO2: 23 mmol/L (ref 22–32)
Calcium: 9.5 mg/dL (ref 8.9–10.3)
Chloride: 105 mmol/L (ref 98–111)
Creatinine, Ser: 3.27 mg/dL — ABNORMAL HIGH (ref 0.44–1.00)
GFR, Estimated: 14 mL/min — ABNORMAL LOW (ref >60)
Glucose, Bld: 124 mg/dL — ABNORMAL HIGH (ref 70–99)
Potassium: 5.4 mmol/L — ABNORMAL HIGH (ref 3.5–5.1)
Sodium: 138 mmol/L (ref 135–145)

## 2019-10-25 LAB — BLOOD GAS, ARTERIAL
Acid-base deficit: 3.2 mmol/L — ABNORMAL HIGH (ref 0.0–2.0)
Bicarbonate: 22 mmol/L (ref 20.0–28.0)
FIO2: 0.34
O2 Saturation: 94.7 %
Patient temperature: 37
pCO2 arterial: 39 mmHg (ref 32.0–48.0)
pH, Arterial: 7.36 (ref 7.350–7.450)
pO2, Arterial: 77 mmHg — ABNORMAL LOW (ref 83.0–108.0)

## 2019-10-25 LAB — PHOSPHORUS: Phosphorus: 4.2 mg/dL (ref 2.5–4.6)

## 2019-10-25 LAB — HEPATITIS B SURFACE ANTIBODY,QUALITATIVE: Hep B S Ab: REACTIVE — AB

## 2019-10-25 LAB — PROCALCITONIN: Procalcitonin: 14.74 ng/mL

## 2019-10-25 LAB — HEPATITIS B SURFACE ANTIGEN: Hepatitis B Surface Ag: NONREACTIVE

## 2019-10-25 LAB — OCCULT BLOOD GASTRIC / DUODENUM (SPECIMEN CUP)
Occult Blood, Gastric: POSITIVE — AB
pH, Gastric: 4

## 2019-10-25 LAB — MAGNESIUM: Magnesium: 3 mg/dL — ABNORMAL HIGH (ref 1.7–2.4)

## 2019-10-25 LAB — MRSA PCR SCREENING: MRSA by PCR: NEGATIVE

## 2019-10-25 MED ORDER — ALBUMIN HUMAN 25 % IV SOLN
25.0000 g | Freq: Once | INTRAVENOUS | Status: AC
  Administered 2019-10-25: 25 g via INTRAVENOUS
  Filled 2019-10-25: qty 100

## 2019-10-25 MED ORDER — SODIUM CHLORIDE 0.9 % IV SOLN
1.0000 g | Freq: Two times a day (BID) | INTRAVENOUS | Status: DC
  Administered 2019-10-25 – 2019-10-26 (×3): 1 g via INTRAVENOUS
  Filled 2019-10-25: qty 10
  Filled 2019-10-25 (×3): qty 1

## 2019-10-25 MED ORDER — SODIUM CHLORIDE 0.9 % IV SOLN
500.0000 mg | INTRAVENOUS | Status: DC
  Administered 2019-10-25 – 2019-10-27 (×3): 500 mg via INTRAVENOUS
  Filled 2019-10-25 (×5): qty 500

## 2019-10-25 MED ORDER — PANTOPRAZOLE SODIUM 40 MG IV SOLR
40.0000 mg | Freq: Two times a day (BID) | INTRAVENOUS | Status: DC
  Administered 2019-10-25 – 2019-11-01 (×14): 40 mg via INTRAVENOUS
  Filled 2019-10-25 (×14): qty 40

## 2019-10-25 MED ORDER — AZITHROMYCIN 250 MG PO TABS: 500.0000 mg | ORAL_TABLET | Freq: Every day | ORAL | Status: DC

## 2019-10-25 NOTE — Consult Note (Signed)
[facility] GI Inpatient Consult Note   Kathline Magic, M.D.  Reason for Consult:  Hepatorenal syndrome, GI bleed.   Attending Requesting Consult: Laurey Arrow, M.D.   History of Present Illness: Katelyn Lane is a 64 y.o. female  Past Medical History:  Past Medical History:  Diagnosis Date  . CHF (congestive heart failure) ([place])   . COVID-19   . Hyperlipidemia   . Hypertension   . Renal disorder     Problem List: Patient Active Problem List   Diagnosis Date Noted  . Cirrhosis of liver ([place]) 10/24/2019  . Cocaine abuse ([place]) 10/20/2019  . Peripheral edema   . Acute CHF (congestive heart failure) ([place]) 07/27/2019  . Prolonged QT interval 06/24/2019  . History of anemia due to chronic kidney disease 06/19/2019  . CHF, acute on chronic ([place]) 06/03/2019  . Acute on chronic combined systolic (congestive) and diastolic (congestive) heart failure ([place]) 06/03/2019  . Accelerated hypertension   . Ventricular tachycardia, non-sustained ([place])   . Chronic obstructive pulmonary disease ([place])   . Pulmonary hypertension ([place])   . Acute metabolic encephalopathy 31/51/7616  . History of substance abuse ([place]) 05/21/2019  . CHF (congestive heart failure) ([place]) 05/21/2019  . Acute systolic CHF (congestive heart failure) ([place]) 04/08/2019  . Acute on chronic combined systolic and diastolic CHF (congestive heart failure) ([place]) 03/24/2019  . COVID-19 03/06/2019  . Acute respiratory failure with hypoxia ([place]) 03/06/2019  . Hypokalemia 03/06/2019  . LV (left ventricular) mural thrombus 03/06/2019  . CKD (chronic kidney disease), stage III ([place]) 03/06/2019  . Nausea and vomiting 02/16/2019  . Abdominal pain 02/16/2019  . Rhabdomyolysis 02/16/2019  . AKI (acute kidney injury) ([place]) 02/16/2019  . Crack cocaine use 02/16/2019  . Tobacco abuse 02/16/2019  . Acute on chronic systolic CHF (congestive heart failure) ([place]) 02/16/2019  . Acute congestive heart failure ([place]) 02/13/2019  .  Respiratory failure with hypoxia ([place]) 01/31/2019  . Stroke ([place]) 01/18/2019  . Dilated cardiomyopathy ([place]) 12/31/2018  . Chest tightness 12/31/2018  . Elevated troponin 12/31/2018  . Hypertensive urgency 12/31/2018  . Hyperlipidemia 12/31/2018  . Depression 12/31/2018  . HFrEF (heart failure with reduced ejection fraction) ([place]) 12/29/2018    Past Surgical History: Past Surgical History:  Procedure Laterality Date  . RIGHT/LEFT HEART CATH AND CORONARY ANGIOGRAPHY N/A 12/30/2018   Procedure: RIGHT/LEFT HEART CATH AND CORONARY ANGIOGRAPHY;  Surgeon: Corey Skains, MD;  Location: [facility];  Service: Cardiovascular;  Laterality: N/A;    Allergies: Allergies  Allergen Reactions  . Penicillins Hives, Rash and Other (See Comments)    Did it involve swelling of the face/tongue/throat, SOB, or low BP? No Did it involve sudden or severe rash/hives, skin peeling, or any reaction on the inside of your mouth or nose? Yes Did you need to seek medical attention at a hospital or doctor's office? Yes When did it last happen? >25 years If all above answers are "NO", may proceed with cephalosporin use.   . Ace Inhibitors Nausea And Vomiting    Home Medications: Medications Prior to Admission  Medication Sig Dispense Refill Last Dose  . albuterol (VENTOLIN HFA) 108 (90 Base) MCG/ACT inhaler Inhale 2 puffs into the lungs every 6 (six) hours as needed for wheezing or shortness of breath.   unknown at unknown  . allopurinol (ZYLOPRIM) 100 MG tablet Take 1 tablet (100 mg total) by mouth daily. 30 tablet 0   . ascorbic acid (VITAMIN C) 500 MG tablet Take 500 mg by  mouth daily.   unknown at unknown  . aspirin 81 MG chewable tablet Chew 1 tablet (81 mg total) by mouth daily. 30 tablet 0 unknown at unknown  . atorvastatin (LIPITOR) 20 MG tablet Take 1 tablet (20 mg total) by mouth daily at 6 PM. 30 tablet 2   . benztropine (COGENTIN) 0.5 MG tablet Take 0.5 tablets (0.25 mg total) by  mouth 2 (two) times daily. 30 tablet 0   . bumetanide (BUMEX) 1 MG tablet Take 1 tablet (1 mg total) by mouth 2 (two) times daily. May take additional dose for worsening leg swellings/shortness of breath 60 tablet 2   . carvedilol (COREG) 6.25 MG tablet Hold this medication until you see your cardiologist 30 tablet 2 unknown at unknown  . docusate calcium (SURFAK) 240 MG capsule Take 240 mg by mouth daily as needed for moderate constipation.  (Patient not taking: Reported on 07/27/2019)     . ferrous sulfate 325 (65 FE) MG tablet Take 1 tablet (325 mg total) by mouth daily with breakfast. 30 tablet 0   . FLUoxetine (PROZAC) 10 MG capsule Take 3 capsules (30 mg total) by mouth daily. 90 capsule 0   . gabapentin (NEURONTIN) 300 MG capsule Take 1 capsule (300 mg total) by mouth 3 (three) times daily. 90 capsule 0   . Ipratropium-Albuterol (COMBIVENT) 20-100 MCG/ACT AERS respimat Inhale 1 puff into the lungs every 6 (six) hours. 4 g 0 unknown at unknown  . loratadine (CLARITIN) 10 MG tablet Take 10 mg by mouth daily as needed for allergies.    unknown at unknown  . midodrine (PROAMATINE) 2.5 MG tablet Take 1 tablet (2.5 mg total) by mouth 3 (three) times daily with meals as needed (for MAP <65 and/or SBP <100). 30 tablet 0 unknown at unknown  . nitroGLYCERIN (NITROSTAT) 0.4 MG SL tablet Place 1 tablet (0.4 mg total) under the tongue every 5 (five) minutes as needed for chest pain. 30 tablet 1 prn at prn  . pantoprazole (PROTONIX) 40 MG tablet Take 1 tablet (40 mg total) by mouth daily. 30 tablet 0 unknown at unknown  . rivaroxaban (XARELTO) 20 MG TABS tablet Take 1 tablet (20 mg total) by mouth daily with supper. For the blood clot in your heart. 30 tablet 2   . sacubitril-valsartan (ENTRESTO) 49-51 MG Take 1 tablet by mouth 2 (two) times daily. 60 tablet 0 unknown at unknown  . spironolactone (ALDACTONE) 25 MG tablet Take 1 tablet (25 mg total) by mouth daily. 30 tablet 1   . spironolactone (ALDACTONE) 25  MG tablet Take 1 tablet (25 mg total) by mouth daily. Hold this medication until you see your cardiologist 30 tablet 1 unknown at unknown  . thiothixene (NAVANE) 1 MG capsule Take 1 capsule (1 mg total) by mouth 2 (two) times daily. 60 capsule 0    Home medication reconciliation was completed with the patient.   Scheduled Inpatient Medications:   . Chlorhexidine Gluconate Cloth  6 each Topical Daily  . mouth rinse  15 mL Mouth Rinse BID  . melatonin  5 mg Oral QHS  . pantoprazole (PROTONIX) IV  40 mg Intravenous BID  . polyethylene glycol  17 g Oral Daily  . sodium chloride flush  3 mL Intravenous Q12H    Continuous Inpatient Infusions:   . sodium chloride    . cefTRIAXone (ROCEPHIN)  IV    . furosemide (LASIX) infusion 30 mg/hr (10/25/19 0820)    PRN Inpatient Medications:  sodium chloride, acetaminophen,  alum & mag hydroxide-simeth, alum & mag hydroxide-simeth **AND** lidocaine, bisacodyl, Glycerin (Adult), morphine injection, ondansetron (ZOFRAN) IV, sodium chloride flush  Family History: family history is not on file.   GI Family History: Negative  Social History:   reports that she has been smoking. She has never used smokeless tobacco. She reports current drug use. Drug: Cocaine. She reports that she does not drink alcohol. The patient denies ETOH, tobacco, or drug use.    Review of Systems: Review of Systems - History obtained from the patient General ROS: positive for  - SOB, leg swelling, nausea negative for - night sweats or weight loss Gastrointestinal ROS: positive for - nausea/vomiting negative for - blood in stools  Physical Examination: BP 107/73 (BP Location: Right Arm)   Pulse 84   Temp 97.9 F (36.6 C) (Oral)   Resp 20   Ht 5\' 6"  (1.676 m)   Wt 115.6 kg   SpO2 97%   BMI 41.13 kg/m  Physical Exam Constitutional:      Appearance: She is well-developed and overweight. She is ill-appearing. She is not toxic-appearing.    HENT:     Head:  Normocephalic.     Jaw: There is normal jaw occlusion.  Eyes:     General: Lids are normal. No scleral icterus.    Extraocular Movements: Extraocular movements intact.  Pulmonary:     Effort: Tachypnea and respiratory distress present.     Breath sounds: Examination of the right-lower field reveals decreased breath sounds. Examination of the left-lower field reveals decreased breath sounds. Decreased breath sounds present. No rhonchi.  Abdominal:     General: Bowel sounds are normal. There is distension.     Palpations: There is shifting dullness and fluid wave.     Tenderness: There is no abdominal tenderness. There is no guarding or rebound.     Hernia: No hernia is present.  Musculoskeletal:     Right shoulder: Normal.     Left shoulder: Normal.     Right upper leg: Normal.     Left upper leg: Normal.     Right lower leg: No deformity or tenderness. 2+ Edema present.     Left lower leg: No deformity or tenderness. 2+ Edema present.  Lymphadenopathy:     Cervical: No cervical adenopathy.  Skin:    Capillary Refill: Capillary refill takes less than 2 seconds.     Coloration: Skin is ashen and pale.     Findings: No abrasion, laceration or rash.  Neurological:     Mental Status: She is oriented to person, place, and time. She is lethargic.     Sensory: Sensation is intact.     Motor: Motor function is intact.  Psychiatric:        Behavior: Behavior is cooperative.     Data: Lab Results  Component Value Date   WBC 10.5 10/25/2019   HGB 13.2 10/25/2019   HCT 41.6 10/25/2019   MCV 74.4 (L) 10/25/2019   PLT 302 10/25/2019   Recent Labs  Lab 10/23/19 0450 10/25/19 0018 10/25/19 0421  HGB 11.7* 13.4 13.2   Lab Results  Component Value Date   NA 138 10/25/2019   K 5.0 10/25/2019   CL 105 10/25/2019   CO2 22 10/25/2019   BUN 61 (H) 10/25/2019   CREATININE 3.25 (H) 10/25/2019   Lab Results  Component Value Date   ALT 10 10/25/2019   AST 17 10/25/2019   ALKPHOS  88 10/25/2019   BILITOT 2.2 (H)  10/25/2019   No results for input(s): APTT, INR, PTT in the last 168 hours. CBC Latest Ref Rng & Units 10/25/2019 10/25/2019 10/23/2019  WBC 4.0 - 10.5 K/uL 10.5 10.5 4.4  Hemoglobin 12.0 - 15.0 g/dL 13.2 13.4 11.7(L)  Hematocrit 36 - 46 % 41.6 42.3 36.3  Platelets 150 - 400 K/uL 302 307 302    STUDIES: US Abdomen Complete  Result Date: 10/24/2019 CLINICAL DATA:  Abdominal pain. Mildly elevated bilirubin and lipase 4 months. EXAM: ABDOMEN ULTRASOUND COMPLETE COMPARISON:  None. FINDINGS: Gallbladder: The gallbladder wall is thickened measuring up to 5.5 mm. No stones, sludge, or Murphy's sign identified. Common bile duct: Diameter: 2.9 mm Liver: Diffuse coarsened echotexture with a nodular contour. No focal mass. Portal vein is patent on color Doppler imaging with normal direction of blood flow towards the liver. IVC: No abnormality visualized. Pancreas: Limited visualization due to bowel gas shadowing. Limited views of the pancreas are normal. Spleen: Size and appearance within normal limits. Right Kidney: Length: 9.7 cm. Contains 2 probable small cysts measuring 1.7 and 1.4 cm. Left Kidney: Length: 10.3 cm. Echogenicity within normal limits. No mass or hydronephrosis visualized. Abdominal aorta: 3.1 cm proximally Other findings: Ascites is seen in all 4 quadrants. Evaluation is limited due to labored breathing. IMPRESSION: 1. The gallbladder wall is thickened which is nonspecific given apparent signs of cirrhosis and obvious ascites. The gallbladder is otherwise unremarkable. If there is concern for acute cholecystitis, recommend HIDA scan. 2. 4 quadrant ascites, moderate. 3. Coarsened echotexture to the liver with a nodular contour suggesting cirrhosis. 4. Mild prominence of the proximal abdominal aorta measuring 3.1 cm. 5. Small cyst in the right kidney. Electronically Signed   By: Dorise Bullion III M.D   On: 10/24/2019 10:28   DG Chest Port 1 View  Result  Date: 10/24/2019 CLINICAL DATA:  64 year old female admitted with heart failure. EXAM: PORTABLE CHEST 1 VIEW COMPARISON:  10/19/2019 FINDINGS: Marked cardiomegaly, unchanged. Cephalization of the pulmonary vasculature. Streaky bibasilar pulmonary opacities. Blunting of left costophrenic angle. No pneumothorax. No acute osseous abnormality. IMPRESSION: Mild pulmonary edema and probable trace left pleural effusion. Stable cardiomegaly. Electronically Signed   By: Ruthann Cancer MD   On: 10/24/2019 12:00   DG ABD ACUTE 2+V W 1V CHEST  Result Date: 10/25/2019 CLINICAL DATA:  Shortness of breath EXAM: DG ABDOMEN ACUTE W/ 1V CHEST COMPARISON:  02/21/2019 FINDINGS: Shallow lung inflation with mild cardiomegaly and left basilar consolidation. Bowel gas pattern is nonobstructive. No free intraperitoneal air. IMPRESSION: Shallow lung inflation with left basilar consolidation, which may indicate atelectasis or pneumonia. Electronically Signed   By: Ulyses Jarred M.D.   On: 10/25/2019 00:21   @IMAGES @  Assessment:  1. 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 Difficult to assess accurately given acuity of situation. Vasc-cath placement today for dialysis within 24 hours per nephrologist. 2. Moderate ascites - Non-tender. R/O SBP. Ceftriaxone started by DR. Wouk empirically. I agree.  3. Reported hemetemesis - Actually, gastroccult positive. No gross hemetemesis noted by staff to my knowledge. Patient denies though reports hemetemesis yesterday at home.Hemodynamically stable at present. Certainly will consider esophageal variceal bleeding, esophagitis, PUD, ischemic gastropathy, GAVE, gastric varices, mallory weiss tear. 4. History of illicit drug abuse. 5. Presumptive cirrhosis per radiographic studies and clinical presentation. Etiology likely alcohol. 6. CHF exacerbation   COVID-19 status: Tested negative  Recommendations: 1. Agree with current efforts at fluid management I.e. diuresis, dilaysis, IV  albumin, paracentesis. 2. Will begin IV octreotide empirically for possible variceal  bleeding source to decrease splanchnic circulation. This may also help if source of bleeding is an ulcer, though less evidenced based.  3. Agree with IV PPI. 4. Agree with IV albumin. 5. I believe an EGD should try to be done while patient is admitted, though but will need to be done electively as no current bleeding and currently has respiratory distress and fluid overload. 6. Will follow.  Thank you for the consult. Please call with questions or concerns.  Olean Ree, "Lanny Hurst MD [facility] [place], [place] [address] [phone]  10/25/2019 11:33 AM

## 2019-10-25 NOTE — Progress Notes (Signed)
[facility]  ROUNDING NOTE   Subjective:   [name] admitted on 10/19/2019 for Peripheral edema [R60.9] Acute CHF (congestive heart failure) ([place]) [I50.9] AKI (acute kidney injury) ([place]) [N17.9] Congestive heart failure, unspecified HF chronicity, unspecified heart failure type ([place]) [I50.9]  Patient appears in moderate distress, respirations labored, fluid overloaded, 2-3+ tight bilateral lower extremity edema.  She is on furosemide infusion, started yesterday.F/C with very minimal urine output.  Patient is on 3 L of oxygen by nasal cannula. Patient is awake,alert, and oriented, able to make own health care decisions.We discussed  with the patient that she may require dialysis urgently, to help with the fluid overload.  Patient expressed willingness to  go forward with dialysis.  Objective:  Vital signs in last 24 hours:  Temp:  [97.3 F (36.3 C)-98.5 F (36.9 C)] 98.5 F (36.9 C) (10/17 0731) Pulse Rate:  [84-97] 84 (10/17 0731) Resp:  [20-22] 22 (10/17 0731) BP: (101-123)/(52-96) 101/81 (10/17 0731) SpO2:  [97 %-100 %] 100 % (10/17 0731)  Weight change:  Filed Weights   10/22/19 0302 10/23/19 0423 10/23/19 2224  Weight: 114.6 kg 115.8 kg 115.6 kg    Intake/Output: I/O last 3 completed shifts: In: 1652.9 [P.O.:960; I.V.:377.6; IV Piggyback:315.3] Out: 245 [Urine:245]   Intake/Output this shift:  No intake/output data recorded.  Physical Exam: General:  Awake, alert, in moderate distress  Head: Nasal cannula in place with 3LO2  Eyes:  Anicteric  Neck: Supple  Lungs:   Increased work of breathing, accessory muscle use+, lungs with crackles bilaterally  Heart:  S1S2, regular rate and rhythm  Abdomen:   Tender, Abdominal swelling +  Extremities: 3+ tight peripheral edema  Neurologic:  Oriented,able to state name, place and DOB accurately  Skin: No acute lesions or rashes    Basic Metabolic Panel: Recent Labs  Lab 10/22/19 0518 10/22/19 0518  10/23/19 0450 10/23/19 0450 10/24/19 0545 10/25/19 0018 10/25/19 0421  NA 138  --  139  --  138 138 138  K 4.0  --  4.0  --  4.7 5.4* 5.0  CL 108  --  107  --  103 105 105  CO2 21*  --  23  --  22 23 22   GLUCOSE 92  --  130*  --  117* 124* 119*  BUN 56*  --  57*  --  55* 61* 61*  CREATININE 2.66*  --  2.67*  --  2.81* 3.27* 3.25*  CALCIUM 8.7*   < > 8.6*   < > 9.4 9.5 9.2  MG  --   --   --   --   --  3.0*  --    < > = values in this interval not displayed.    Liver Function Tests: Recent Labs  Lab 10/23/19 0450 10/24/19 0545 10/25/19 0421  AST 23 25 17   ALT 15 14 10   ALKPHOS 119 113 88  BILITOT 1.4* 2.3* 2.2*  PROT 6.0* 7.0 6.1*  ALBUMIN 2.5*  2.5* 3.2* 3.0*   Recent Labs  Lab 10/23/19 0450 10/24/19 0545  LIPASE 79* 55*   No results for input(s): AMMONIA in the last 168 hours.  CBC: Recent Labs  Lab 10/19/19 0935 10/23/19 0450 10/25/19 0018 10/25/19 0421  WBC 5.7 4.4 10.5 10.5  HGB 12.2 11.7* 13.4 13.2  HCT 38.5 36.3 42.3 41.6  MCV 75.2* 74.4* 75.9* 74.4*  PLT 331 302 307 302    Cardiac Enzymes: No results for input(s): CKTOTAL, CKMB, CKMBINDEX,  TROPONINI in the last 168 hours.  BNP: Invalid input(s): POCBNP  CBG: No results for input(s): GLUCAP in the last 168 hours.  Microbiology: Results for orders placed or performed during the hospital encounter of 10/19/19  Respiratory Panel by RT PCR (Flu A&B, Covid) - Nasopharyngeal Swab     Status: None   Collection Time: 10/19/19 11:13 PM   Specimen: Nasopharyngeal Swab  Result Value Ref Range Status   SARS Coronavirus 2 by RT PCR NEGATIVE NEGATIVE Final    Comment: (NOTE) SARS-CoV-2 target nucleic acids are NOT DETECTED.  The SARS-CoV-2 RNA is generally detectable in upper respiratoy specimens during the acute phase of infection. The lowest concentration of SARS-CoV-2 viral copies this assay can detect is 131 copies/mL. A negative result does not preclude SARS-Cov-2 infection and should not be  used as the sole basis for treatment or other patient management decisions. A negative result may occur with  improper specimen collection/handling, submission of specimen other than nasopharyngeal swab, presence of viral mutation(s) within the areas targeted by this assay, and inadequate number of viral copies (<131 copies/mL). A negative result must be combined with clinical observations, patient history, and epidemiological information. The expected result is Negative.  Fact Sheet for Patients:  PinkCheek.be  Fact Sheet for Healthcare Providers:  GravelBags.it  This test is no t yet approved or cleared by the [place] FDA and  has been authorized for detection and/or diagnosis of SARS-CoV-2 by FDA under an Emergency Use Authorization (EUA). This EUA will remain  in effect (meaning this test can be used) for the duration of the COVID-19 declaration under Section 564(b)(1) of the Act, 21 U.S.C. section 360bbb-3(b)(1), unless the authorization is terminated or revoked sooner.     Influenza A by PCR NEGATIVE NEGATIVE Final   Influenza B by PCR NEGATIVE NEGATIVE Final    Comment: (NOTE) The Xpert Xpress SARS-CoV-2/FLU/RSV assay is intended as an aid in  the diagnosis of influenza from Nasopharyngeal swab specimens and  should not be used as a sole basis for treatment. Nasal washings and  aspirates are unacceptable for Xpert Xpress SARS-CoV-2/FLU/RSV  testing.  Fact Sheet for Patients: PinkCheek.be  Fact Sheet for Healthcare Providers: GravelBags.it  This test is not yet approved or cleared by the [place] FDA and  has been authorized for detection and/or diagnosis of SARS-CoV-2 by  FDA under an Emergency Use Authorization (EUA). This EUA will remain  in effect (meaning this test can be used) for the duration of the  Covid-19 declaration under Section  564(b)(1) of the Act, 21  U.S.C. section 360bbb-3(b)(1), unless the authorization is  terminated or revoked. Performed at [facility], [address] [place], [place] [address]     Coagulation Studies: No results for input(s): LABPROT, INR in the last 72 hours.  Urinalysis: No results for input(s): COLORURINE, LABSPEC, PHURINE, GLUCOSEU, HGBUR, BILIRUBINUR, KETONESUR, PROTEINUR, UROBILINOGEN, NITRITE, LEUKOCYTESUR in the last 72 hours.  Invalid input(s): APPERANCEUR    Imaging: US Abdomen Complete  Result Date: 10/24/2019 CLINICAL DATA:  Abdominal pain. Mildly elevated bilirubin and lipase 4 months. EXAM: ABDOMEN ULTRASOUND COMPLETE COMPARISON:  None. FINDINGS: Gallbladder: The gallbladder wall is thickened measuring up to 5.5 mm. No stones, sludge, or Murphy's sign identified. Common bile duct: Diameter: 2.9 mm Liver: Diffuse coarsened echotexture with a nodular contour. No focal mass. Portal vein is patent on color Doppler imaging with normal direction of blood flow towards the liver. IVC: No abnormality visualized. Pancreas: Limited visualization due to bowel  gas shadowing. Limited views of the pancreas are normal. Spleen: Size and appearance within normal limits. Right Kidney: Length: 9.7 cm. Contains 2 probable small cysts measuring 1.7 and 1.4 cm. Left Kidney: Length: 10.3 cm. Echogenicity within normal limits. No mass or hydronephrosis visualized. Abdominal aorta: 3.1 cm proximally Other findings: Ascites is seen in all 4 quadrants. Evaluation is limited due to labored breathing. IMPRESSION: 1. The gallbladder wall is thickened which is nonspecific given apparent signs of cirrhosis and obvious ascites. The gallbladder is otherwise unremarkable. If there is concern for acute cholecystitis, recommend HIDA scan. 2. 4 quadrant ascites, moderate. 3. Coarsened echotexture to the liver with a nodular contour suggesting cirrhosis. 4. Mild prominence of the proximal abdominal aorta  measuring 3.1 cm. 5. Small cyst in the right kidney. Electronically Signed   By: Dorise Bullion III M.D   On: 10/24/2019 10:28   DG Chest Port 1 View  Result Date: 10/24/2019 CLINICAL DATA:  64 year old female admitted with heart failure. EXAM: PORTABLE CHEST 1 VIEW COMPARISON:  10/19/2019 FINDINGS: Marked cardiomegaly, unchanged. Cephalization of the pulmonary vasculature. Streaky bibasilar pulmonary opacities. Blunting of left costophrenic angle. No pneumothorax. No acute osseous abnormality. IMPRESSION: Mild pulmonary edema and probable trace left pleural effusion. Stable cardiomegaly. Electronically Signed   By: Ruthann Cancer MD   On: 10/24/2019 12:00   DG ABD ACUTE 2+V W 1V CHEST  Result Date: 10/25/2019 CLINICAL DATA:  Shortness of breath EXAM: DG ABDOMEN ACUTE W/ 1V CHEST COMPARISON:  02/21/2019 FINDINGS: Shallow lung inflation with mild cardiomegaly and left basilar consolidation. Bowel gas pattern is nonobstructive. No free intraperitoneal air. IMPRESSION: Shallow lung inflation with left basilar consolidation, which may indicate atelectasis or pneumonia. Electronically Signed   By: Ulyses Jarred M.D.   On: 10/25/2019 00:21     Medications:   . sodium chloride    . cefTRIAXone (ROCEPHIN)  IV    . furosemide (LASIX) infusion 30 mg/hr (10/25/19 0820)   . Chlorhexidine Gluconate Cloth  6 each Topical Daily  . mouth rinse  15 mL Mouth Rinse BID  . melatonin  5 mg Oral QHS  . pantoprazole (PROTONIX) IV  40 mg Intravenous BID  . polyethylene glycol  17 g Oral Daily  . sodium chloride flush  3 mL Intravenous Q12H   sodium chloride, acetaminophen, alum & mag hydroxide-simeth, alum & mag hydroxide-simeth **AND** lidocaine, bisacodyl, Glycerin (Adult), morphine injection, ondansetron (ZOFRAN) IV, sodium chloride flush  Assessment/ Plan:  [name] is a 64 y.o. black female with congestive heart failure, hypertension, hyperlipidemia, gout, COPD, anemia, cocaine abuse who is  admitted to [facility] on 10/19/2019 for Peripheral edema [R60.9] Acute CHF (congestive heart failure) ([place]) [I50.9] AKI (acute kidney injury) ([place]) [N17.9] Congestive heart failure, unspecified HF chronicity, unspecified heart failure type ([place]) [I50.9]  1. Acute kidney injury on chronic kidney disease stage IIIB. Baseline creatinine of 1.66, GFR of 32. With history of proteinuria.  Chronic kidney disease secondary to hypertension  Acute renal failure secondary to acute cardiorenal syndrome  -Progressively worsening shortness of breath and lower extremity edema  -Creatinine 3.25 -BUN 61 - Entresto on hold -Furosemide infusion 30 mg/hr on flow -Oliguric -Discussed need for urgent dialysis with the patient,and the patient is willing to go forward with dialysis treatment. -Consulted Vascular team for Permcath placement  -Hepatitis Panel ordered  2. Hypotension with acute exacerbation of systolic and diastolic congestive heart failure. Echocardiogram 06/22/19 EF of 25-30%.  -Increased work of breathing, on 3 L nasal cannula -Blood  pressure readings within low normal range - Cardiology team following -On IV furosemide infusion     LOS: 6 Pascale Maves 10/17/202111:07 AM

## 2019-10-25 NOTE — Plan of Care (Signed)

## 2019-10-25 NOTE — Progress Notes (Signed)
Patient Name: Katelyn Lane Date of Encounter: 10/25/2019  Hospital Problem List     Principal Problem:   Acute CHF (congestive heart failure) (HCC) Active Problems:   AKI (acute kidney injury) ([place])   Crack cocaine use   Acute respiratory failure with hypoxia (HCC)   LV (left ventricular) mural thrombus   Cocaine abuse ([place])   Peripheral edema   Cirrhosis of liver [facility])    Patient Profile     Katelyn Lane is a 64 year old female with PMH significant for chronic combined systolic HF (PJ=82-50% Echo 06/2019), h/o CVA, ventricular thrombus (on Xarelto), HTN, HLD, CKD Stage IIIb and cocaine use who was admitted for CHF exacerbation thus cardiology was consulted.   Subjective   Sob with abdominal pain  Inpatient Medications    . Chlorhexidine Gluconate Cloth  6 each Topical Daily  . mouth rinse  15 mL Mouth Rinse BID  . melatonin  5 mg Oral QHS  . pantoprazole (PROTONIX) IV  40 mg Intravenous BID  . polyethylene glycol  17 g Oral Daily  . sodium chloride flush  3 mL Intravenous Q12H    Vital Signs    Vitals:   10/24/19 2135 10/24/19 2324 10/25/19 0638 10/25/19 0731  BP: 104/90 (!) 106/52 108/80 101/81  Pulse: 86 84 86 84  Resp: 20 20 20  (!) 22  Temp: 98 F (36.7 C) (!) 97.5 F (36.4 C) 98.4 F (36.9 C) 98.5 F (36.9 C)  TempSrc:  Oral  Oral  SpO2: 99% 99% 97% 100%  Weight:      Height:        Intake/Output Summary (Last 24 hours) at 10/25/2019 1023 Last data filed at 10/25/2019 0300 Gross per 24 hour  Intake 827.61 ml  Output 45 ml  Net 782.61 ml   Filed Weights   10/22/19 0302 10/23/19 0423 10/23/19 2224  Weight: 114.6 kg 115.8 kg 115.6 kg    Physical Exam    GEN: Well nourished, well developed, in no acute distress.  HEENT: normal.  Neck: Supple, no JVD, carotid bruits, or masses. Cardiac: RRR, no murmurs, rubs, or gallops. No clubbing, cyanosis, edema.  Radials/DP/PT 2+ and equal bilaterally.  Respiratory:  Respirations regular and unlabored,  clear to auscultation bilaterally. GI: Soft, nontender, nondistended, BS + x 4. MS: no deformity or atrophy. Skin: warm and dry, no rash. Neuro:  Strength and sensation are intact. Psych: Normal affect.  Labs    CBC Recent Labs    10/25/19 0018 10/25/19 0421  WBC 10.5 10.5  HGB 13.4 13.2  HCT 42.3 41.6  MCV 75.9* 74.4*  PLT 307 539   Basic Metabolic Panel Recent Labs    10/25/19 0018 10/25/19 0421  NA 138 138  K 5.4* 5.0  CL 105 105  CO2 23 22  GLUCOSE 124* 119*  BUN 61* 61*  CREATININE 3.27* 3.25*  CALCIUM 9.5 9.2  MG 3.0*  --    Liver Function Tests Recent Labs    10/24/19 0545 10/25/19 0421  AST 25 17  ALT 14 10  ALKPHOS 113 88  BILITOT 2.3* 2.2*  PROT 7.0 6.1*  ALBUMIN 3.2* 3.0*   Recent Labs    10/23/19 0450 10/24/19 0545  LIPASE 79* 55*   Cardiac Enzymes No results for input(s): CKTOTAL, CKMB, CKMBINDEX, TROPONINI in the last 72 hours. BNP No results for input(s): BNP in the last 72 hours. D-Dimer No results for input(s): DDIMER in the last 72 hours. Hemoglobin A1C No results for input(s): HGBA1C  in the last 72 hours. Fasting Lipid Panel No results for input(s): CHOL, HDL, LDLCALC, TRIG, CHOLHDL, LDLDIRECT in the last 72 hours. Thyroid Function Tests No results for input(s): TSH, T4TOTAL, T3FREE, THYROIDAB in the last 72 hours.  Invalid input(s): FREET3  Telemetry    nsr  ECG    nsr  Radiology    DG Chest 2 View  Result Date: 10/19/2019 CLINICAL DATA:  Shortness of breath. EXAM: CHEST - 2 VIEW COMPARISON:  08/27/2019 and chest CT 08/15/2019 FINDINGS: Stable cardiomegaly. No overt pulmonary edema. Difficult to exclude densities at the left lung base and small left pleural effusion. Negative for a pneumothorax. IMPRESSION: Stable cardiomegaly with probable left basilar chest densities. Difficult to exclude a small left pleural effusion. Minimal change from the exam on 08/27/2019. Electronically Signed   By: Markus Daft M.D.   On:  10/19/2019 09:42   US Abdomen Complete  Result Date: 10/24/2019 CLINICAL DATA:  Abdominal pain. Mildly elevated bilirubin and lipase 4 months. EXAM: ABDOMEN ULTRASOUND COMPLETE COMPARISON:  None. FINDINGS: Gallbladder: The gallbladder wall is thickened measuring up to 5.5 mm. No stones, sludge, or Murphy's sign identified. Common bile duct: Diameter: 2.9 mm Liver: Diffuse coarsened echotexture with a nodular contour. No focal mass. Portal vein is patent on color Doppler imaging with normal direction of blood flow towards the liver. IVC: No abnormality visualized. Pancreas: Limited visualization due to bowel gas shadowing. Limited views of the pancreas are normal. Spleen: Size and appearance within normal limits. Right Kidney: Length: 9.7 cm. Contains 2 probable small cysts measuring 1.7 and 1.4 cm. Left Kidney: Length: 10.3 cm. Echogenicity within normal limits. No mass or hydronephrosis visualized. Abdominal aorta: 3.1 cm proximally Other findings: Ascites is seen in all 4 quadrants. Evaluation is limited due to labored breathing. IMPRESSION: 1. The gallbladder wall is thickened which is nonspecific given apparent signs of cirrhosis and obvious ascites. The gallbladder is otherwise unremarkable. If there is concern for acute cholecystitis, recommend HIDA scan. 2. 4 quadrant ascites, moderate. 3. Coarsened echotexture to the liver with a nodular contour suggesting cirrhosis. 4. Mild prominence of the proximal abdominal aorta measuring 3.1 cm. 5. Small cyst in the right kidney. Electronically Signed   By: Dorise Bullion III M.D   On: 10/24/2019 10:28   US RENAL  Result Date: 10/20/2019 CLINICAL DATA:  64 year old female with acute renal failure. EXAM: RENAL / URINARY TRACT ULTRASOUND COMPLETE COMPARISON:  CT abdomen pelvis dated 08/15/2019. FINDINGS: Right Kidney: Renal measurements: 10.9 x 4.6 x 4.9 cm = volume: 127 mL. There is mild diffuse increased echogenicity. No hydronephrosis or shadowing stone.  There is a 1.7 x 1.1 x 1.1 cm lateral interpolar cyst. Left Kidney: Renal measurements: 10.1 x 4.8 x 5.3 cm = volume: 132 mL. Mild increased echogenicity. No hydronephrosis or shadowing stone. Bladder: The bladder is partially visualized. Other: There is a small ascites and anasarca. IMPRESSION: 1. Increased renal parenchymal echogenicity in keeping with chronic kidney disease. No hydronephrosis or shadowing stone. 2. Small ascites. Electronically Signed   By: Anner Crete M.D.   On: 10/20/2019 17:59   DG Chest Port 1 View  Result Date: 10/24/2019 CLINICAL DATA:  64 year old female admitted with heart failure. EXAM: PORTABLE CHEST 1 VIEW COMPARISON:  10/19/2019 FINDINGS: Marked cardiomegaly, unchanged. Cephalization of the pulmonary vasculature. Streaky bibasilar pulmonary opacities. Blunting of left costophrenic angle. No pneumothorax. No acute osseous abnormality. IMPRESSION: Mild pulmonary edema and probable trace left pleural effusion. Stable cardiomegaly. Electronically Signed   By: Camillia Herter  Suttle MD   On: 10/24/2019 12:00   DG ABD ACUTE 2+V W 1V CHEST  Result Date: 10/25/2019 CLINICAL DATA:  Shortness of breath EXAM: DG ABDOMEN ACUTE W/ 1V CHEST COMPARISON:  02/21/2019 FINDINGS: Shallow lung inflation with mild cardiomegaly and left basilar consolidation. Bowel gas pattern is nonobstructive. No free intraperitoneal air. IMPRESSION: Shallow lung inflation with left basilar consolidation, which may indicate atelectasis or pneumonia. Electronically Signed   By: Ulyses Jarred M.D.   On: 10/25/2019 00:21    Assessment & Plan    Principal Problem: Acute CHF (congestive heart failure) (HCC) Active Problems: AKI (acute kidney injury) ([place]) Crack cocaine use Acute respiratory failure with hypoxia (HCC) LV (left ventricular) mural thrombus Cocaine abuse ([place]) Peripheral edema   ASSESSMENT AND PLAN: 1. Acute on Chronic CHF exacerbation (EF =25-30% on Echo), uncompensated,  fairly stable at this time -BNP >4500 -High Sensitivity troponin=106>>156; likely due to demand ischemia.  -Lasix drip started. Will follow.  -Hold Entresto at this time due to hypotension and acute on chronic CKD stage IIIb. Consider restarting as outpatient.  -appreciate nephrology input.   -1500cc FS. -Strict I&O's -Daily weights - Recommend continuous telemetry monitoring but patient refuses  -low sodium, heart healthy diet  2. Acute hypoxic respiratory failure, stable  -Continue HF management as stated above. -Agree with supplemental oxygen therapy. Wean as tolerated. -RT consult appreciated.  3. Acute on Chronic CKD Stage IIIb, fairly stable -Nephrology input appreciated. Not responisve to lasix drip. May need HD -We will continue to hold Entresto at this time.  -  4. H/o Ventricular thrombus, stable -patient on xarelto; continue.  -follow bleeding precautions.   5. Hypotension,resolved, patient is normotensive at this time -monitor bp q4h.  -hold antihypertensives at this time.Consider restarting as outpatient.  6. HLD, stable -lipid profile from 05/22/19 is within reference range with a total cholesterol=100, HDL=46, LDL=43 and Triglycerides=56 -continue diet management.   5. Cocaine use -psychiatric evaluation appreciated. -Recommend cocaine cessation. -Recommend social work consult for sociological issues (homelessness).  Signed, Javier Docker Noeli Lavery MD 10/25/2019, 10:23 AM  Pager: [phone]

## 2019-10-25 NOTE — Progress Notes (Addendum)
PROGRESS NOTE    Katelyn Lane  VEH:[id number] DOB: 07/31/1955 DOA: 10/19/2019 PCP: [name], MD   Brief Narrative: Taken from H&P and chart review. Katelyn Lane is a 64 y.o. 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 Found to have BNP greater than 4500, elevated troponin and AKI.  Multiple prior admissions with similar symptoms and complaints.  Prolonged hospitalization in August 2021 when she was discharged to SNF.  Not clear when she get out of SNF.  Continues to complain that she is homeless but per social worker she do have a house.  Not take her medication despite being provided with medical management.   Subjective: This morning no specific complaints. Says breathing is labored but unchanged. No current nausea but overnight several episodes of dark brown emesis, gastroccult positive.  Assessment & Plan:   Principal Problem:   Acute CHF (congestive heart failure) (HCC) Active Problems:   AKI (acute kidney injury) ([place])   Crack cocaine use   Acute respiratory failure with hypoxia (HCC)   LV (left ventricular) mural thrombus   Cocaine abuse ([place])   Peripheral edema   Cirrhosis of liver ([place])  Concern for Upper GI bleed Overnight several episodes dark emesis, gastroccult positive. No apparent hx of GI bleed, recent dx of cirrhosis does not appear has been evaluated for varices. Vomiting can be side effect of lasix and was on high dose gtt. No further episodes of emesis. No amemia, h/h stable. - home rivaroxaban on hold - s/p pantop 80, will order 40 iv bid - will order prophylactic ceftriaxone 1 g daily - npo - gi consulted  Acute on chronic combined systolic and diastolic heart failure. Acute respiratory failure   Patient with EF of 25 to 30% and grade 3 diastolic dysfunction on echocardiogram done in June 2021.  Multiple admissions for similar symptoms and being noncompliant.  Continue to use cocaine. Mild trop elevation, likely demand.  -trial of lasix gtt yesterda, increased to 40/hr with minimal uop, also received albumin yesterday - LLL possible consolidation, started on ceftriaxone as above, procalcitonin is markedly elevated, will add azithromycin. May have aspirated given vomiting last night. - check blood cultures - given poor response to diuretic, tachypnea, increased wob, will place on bipap today. abg shows hypoxemia. Patient did confirm with me she is full code -Continue to monitor daily weight and BMP. -Strict intake and output. Less than 100 cc urine yesterday - hyolding entresto and bb until bp stable - cardiology following, appreciate recs  Cirrhosis Abdominal pain -10/15 complaining of diffuse abd pain, no other symptoms. Labs showed no elevated WBCs, chronically elevated bili. Mild lipase elevation, slightly improved today. U/s yesterday shows mod ascites, non-specific gallbladder wall thickening, cirrhotic liver. Nodular liver was seen on u/s of august of this year. Suspect ascites consequence of some combination of chf and cirrhosis. Cirrhosis may be 2/2 nafld, possible alcohol use. Hep b negative on recent labs - will need ascitic fluid sampling once respiratory status is stabilized - f/u hcv antibody, iron panel - likely continued outpt w/u  Acute hypoxic respiratory failure COPD Rrequiring 6 L on admission, o2 off oxygen was 89%, now weaned to 2 L, likely 2/2 above cardiac process.  - treatment as above  Hypotension - low normal pressures yesterday, likely 2/2 overload hemodynamics - diuresis  as above - holding bb and entresto as above  AKI with CKD stage IIIb/IV. Likely cardiorenal syndrome. Creatinine about 3, baseline around 2. Cr worsening to 3.25 today, K borderline  elevated at 5, bicarb normal. slight worsening from yesterday. U/s showing medical renal disease. -Continue with diuresis. -nephrology following, appreciate recs.  - possible need for dialysis as above -Continue to monitor renal function. -Avoid nephrotoxins.  Cocaine use.  According to H&P last use was 2 to 3 days prior to admission.  UDS positive for cocaine.  Patient refused to answer any questions regarding her cocaine use .  This will complicate overall prognosis and care.  History of ventricular thrombus in 01/2019 and hx of CVA - off xarelto as above  Social issues.  Apparently she was not competent enough to make her own decisions according to the psych evaluation done during recent hospitalization. -will involve psychiatry; social work will also attempt to contact patient's APS caseworker   Objective: Vitals:   10/24/19 2135 10/24/19 2324 10/25/19 0638 10/25/19 0731  BP: 104/90 (!) 106/52 108/80 101/81  Pulse: 86 84 86 84  Resp: 20 20 20  (!) 22  Temp: 98 F (36.7 C) (!) 97.5 F (36.4 C) 98.4 F (36.9 C) 98.5 F (36.9 C)  TempSrc:  Oral  Oral  SpO2: 99% 99% 97% 100%  Weight:      Height:        Intake/Output Summary (Last 24 hours) at 10/25/2019 0927 Last data filed at 10/25/2019 0300 Gross per 24 hour  Intake 827.61 ml  Output 45 ml  Net 782.61 ml   Filed Weights   10/22/19 0302 10/23/19 0423 10/23/19 2224  Weight: 114.6 kg 115.8 kg 115.6 kg    Examination:  General exam: sitting up, tachypnic, appears comforable Respiratory system: scattered exp wheezes, tachypnic Cardiovascular system: S1 & S2 heard, RRR. Soft systolic murmur. Distant heart sounds Gastrointestinal system: Soft, diffuse mild ttp, no rebound Central nervous system: Alert and oriented. No focal neurological deficits. Extremities: pitting edema to thighs Psychiatry: Judgement and insight appear impaired.   DVT prophylaxis: holding for now given concern for ugib Code Status: Full Family  Communication: none at bedside Disposition Plan:  Status is: Inpatient  Remains inpatient appropriate because:Inpatient level of care appropriate due to severity of illness   Dispo: The patient is from: Home/homeless              Anticipated d/c is to: To be determined              Anticipated d/c date is: 4-7 days              Patient currently is not medically stable to d/c.   Consultants:   Nephrology, cardiology  Procedures: renal u/s Antimicrobials: none  Data Reviewed: I have personally reviewed following labs and imaging studies  CBC: Recent Labs  Lab 10/19/19 0935 10/23/19 0450 10/25/19 0018 10/25/19 0421  WBC 5.7 4.4 10.5 10.5  HGB 12.2 11.7* 13.4 13.2  HCT 38.5 36.3 42.3 41.6  MCV 75.2* 74.4* 75.9* 74.4*  PLT 331 302 307 366   Basic Metabolic Panel: Recent Labs  Lab 10/22/19 0518 10/23/19 0450 10/24/19 0545 10/25/19 0018 10/25/19 0421  NA 138 139 138 138 138  K 4.0 4.0 4.7 5.4* 5.0  CL 108 107 103 105 105  CO2 21* 23 22 23 22   GLUCOSE 92 130* 117* 124* 119*  BUN 56* 57* 55* 61* 61*  CREATININE 2.66* 2.67* 2.81* 3.27* 3.25*  CALCIUM  8.7* 8.6* 9.4 9.5 9.2  MG  --   --   --  3.0*  --    GFR: Estimated Creatinine Clearance: 22.9 mL/min (A) (by C-G formula based on SCr of 3.25 mg/dL (H)). Liver Function Tests: Recent Labs  Lab 10/23/19 0450 10/24/19 0545 10/25/19 0421  AST 23 25 17   ALT 15 14 10   ALKPHOS 119 113 88  BILITOT 1.4* 2.3* 2.2*  PROT 6.0* 7.0 6.1*  ALBUMIN 2.5*  2.5* 3.2* 3.0*   Recent Labs  Lab 10/23/19 0450 10/24/19 0545  LIPASE 79* 55*   No results for input(s): AMMONIA in the last 168 hours. Coagulation Profile: No results for input(s): INR, PROTIME in the last 168 hours. Cardiac Enzymes: No results for input(s): CKTOTAL, CKMB, CKMBINDEX, TROPONINI in the last 168 hours. BNP (last 3 results) No results for input(s): PROBNP in the last 8760 hours. HbA1C: No results for input(s): HGBA1C in the last 72  hours. CBG: No results for input(s): GLUCAP in the last 168 hours. Lipid Profile: No results for input(s): CHOL, HDL, LDLCALC, TRIG, CHOLHDL, LDLDIRECT in the last 72 hours. Thyroid Function Tests: No results for input(s): TSH, T4TOTAL, FREET4, T3FREE, THYROIDAB in the last 72 hours. Anemia Panel: Recent Labs    10/25/19 0421  TIBC 172*  IRON 30   Sepsis Labs: No results for input(s): PROCALCITON, LATICACIDVEN in the last 168 hours.  Recent Results (from the past 240 hour(s))  Respiratory Panel by RT PCR (Flu A&B, Covid) - Nasopharyngeal Swab     Status: None   Collection Time: 10/19/19 11:13 PM   Specimen: Nasopharyngeal Swab  Result Value Ref Range Status   SARS Coronavirus 2 by RT PCR NEGATIVE NEGATIVE Final    Comment: (NOTE) SARS-CoV-2 target nucleic acids are NOT DETECTED.  The SARS-CoV-2 RNA is generally detectable in upper respiratoy specimens during the acute phase of infection. The lowest concentration of SARS-CoV-2 viral copies this assay can detect is 131 copies/mL. A negative result does not preclude SARS-Cov-2 infection and should not be used as the sole basis for treatment or other patient management decisions. A negative result may occur with  improper specimen collection/handling, submission of specimen other than nasopharyngeal swab, presence of viral mutation(s) within the areas targeted by this assay, and inadequate number of viral copies (<131 copies/mL). A negative result must be combined with clinical observations, patient history, and epidemiological information. The expected result is Negative.  Fact Sheet for Patients:  PinkCheek.be  Fact Sheet for Healthcare Providers:  GravelBags.it  This test is no t yet approved or cleared by the [place] FDA and  has been authorized for detection and/or diagnosis of SARS-CoV-2 by FDA under an Emergency Use Authorization (EUA). This EUA will  remain  in effect (meaning this test can be used) for the duration of the COVID-19 declaration under Section 564(b)(1) of the Act, 21 U.S.C. section 360bbb-3(b)(1), unless the authorization is terminated or revoked sooner.     Influenza A by PCR NEGATIVE NEGATIVE Final   Influenza B by PCR NEGATIVE NEGATIVE Final    Comment: (NOTE) The Xpert Xpress SARS-CoV-2/FLU/RSV assay is intended as an aid in  the diagnosis of influenza from Nasopharyngeal swab specimens and  should not be used as a sole basis for treatment. Nasal washings and  aspirates are unacceptable for Xpert Xpress SARS-CoV-2/FLU/RSV  testing.  Fact Sheet for Patients: PinkCheek.be  Fact Sheet for Healthcare Providers: GravelBags.it  This test is not yet approved or cleared by the [place]  FDA and  has been authorized for detection and/or diagnosis of SARS-CoV-2 by  FDA under an Emergency Use Authorization (EUA). This EUA will remain  in effect (meaning this test can be used) for the duration of the  Covid-19 declaration under Section 564(b)(1) of the Act, 21  U.S.C. section 360bbb-3(b)(1), unless the authorization is  terminated or revoked. Performed at [facility], [address] [place], [place] [address]      Radiology Studies: US Abdomen Complete  Result Date: 10/24/2019 CLINICAL DATA:  Abdominal pain. Mildly elevated bilirubin and lipase 4 months. EXAM: ABDOMEN ULTRASOUND COMPLETE COMPARISON:  None. FINDINGS: Gallbladder: The gallbladder wall is thickened measuring up to 5.5 mm. No stones, sludge, or Murphy's sign identified. Common bile duct: Diameter: 2.9 mm Liver: Diffuse coarsened echotexture with a nodular contour. No focal mass. Portal vein is patent on color Doppler imaging with normal direction of blood flow towards the liver. IVC: No abnormality visualized. Pancreas: Limited visualization due to bowel gas shadowing. Limited views  of the pancreas are normal. Spleen: Size and appearance within normal limits. Right Kidney: Length: 9.7 cm. Contains 2 probable small cysts measuring 1.7 and 1.4 cm. Left Kidney: Length: 10.3 cm. Echogenicity within normal limits. No mass or hydronephrosis visualized. Abdominal aorta: 3.1 cm proximally Other findings: Ascites is seen in all 4 quadrants. Evaluation is limited due to labored breathing. IMPRESSION: 1. The gallbladder wall is thickened which is nonspecific given apparent signs of cirrhosis and obvious ascites. The gallbladder is otherwise unremarkable. If there is concern for acute cholecystitis, recommend HIDA scan. 2. 4 quadrant ascites, moderate. 3. Coarsened echotexture to the liver with a nodular contour suggesting cirrhosis. 4. Mild prominence of the proximal abdominal aorta measuring 3.1 cm. 5. Small cyst in the right kidney. Electronically Signed   By: Dorise Bullion III M.D   On: 10/24/2019 10:28   DG Chest Port 1 View  Result Date: 10/24/2019 CLINICAL DATA:  64 year old female admitted with heart failure. EXAM: PORTABLE CHEST 1 VIEW COMPARISON:  10/19/2019 FINDINGS: Marked cardiomegaly, unchanged. Cephalization of the pulmonary vasculature. Streaky bibasilar pulmonary opacities. Blunting of left costophrenic angle. No pneumothorax. No acute osseous abnormality. IMPRESSION: Mild pulmonary edema and probable trace left pleural effusion. Stable cardiomegaly. Electronically Signed   By: Ruthann Cancer MD   On: 10/24/2019 12:00   DG ABD ACUTE 2+V W 1V CHEST  Result Date: 10/25/2019 CLINICAL DATA:  Shortness of breath EXAM: DG ABDOMEN ACUTE W/ 1V CHEST COMPARISON:  02/21/2019 FINDINGS: Shallow lung inflation with mild cardiomegaly and left basilar consolidation. Bowel gas pattern is nonobstructive. No free intraperitoneal air. IMPRESSION: Shallow lung inflation with left basilar consolidation, which may indicate atelectasis or pneumonia. Electronically Signed   By: Ulyses Jarred M.D.   On:  10/25/2019 00:21    Scheduled Meds: . Chlorhexidine Gluconate Cloth  6 each Topical Daily  . mouth rinse  15 mL Mouth Rinse BID  . melatonin  5 mg Oral QHS  . polyethylene glycol  17 g Oral Daily  . sodium chloride flush  3 mL Intravenous Q12H   Continuous Infusions: . sodium chloride    . albumin human Stopped (10/24/19 2315)  . furosemide (LASIX) infusion 30 mg/hr (10/25/19 0820)     LOS: 6 days   Time spent: 35 minutes.  Desma Maxim, MD [facility]  If 7PM-7AM, please contact night-coverage [email or web address]  10/25/2019, 9:27 AM

## 2019-10-25 NOTE — H&P (View-Only) (Signed)
[facility] Vascular Consult Note  MRN : [id number]  Katelyn Lane is a 64 y.o. (08-Mar-1955) female who presents with chief complaint of  Chief Complaint  Patient presents with  . Leg Swelling  .  History of Present Illness: Patient is admitted to the hospital with CRF III and has developed acute renal failure.  The patient reports shortness of breath.  The patient is critically ill with ARF, CHF, respiratory distress. The nephrology service has decided to initiate dialysis at this time, and we are asked to place a temporary dialysis catheter for immediate dialysis use.    Current Facility-Administered Medications  Medication Dose Route Frequency Provider Last Rate Last Admin  . 0.9 %  sodium chloride infusion  250 mL Intravenous PRN Tu, Ching T, DO 10 mL/hr at 10/25/19 1626 250 mL at 10/25/19 1626  . acetaminophen (TYLENOL) tablet 650 mg  650 mg Oral Q4H PRN Tu, Ching T, DO   650 mg at 10/24/19 1047  . alum & mag hydroxide-simeth (MAALOX/MYLANTA) 200-200-20 MG/5ML suspension 30 mL  30 mL Oral Q4H PRN Gwynne Edinger, MD   30 mL at 10/23/19 2105  . alum & mag hydroxide-simeth (MAALOX/MYLANTA) 200-200-20 MG/5ML suspension 30 mL  30 mL Oral Q4H PRN Mansy, Jan A, MD   30 mL at 10/23/19 2205   And  . lidocaine (XYLOCAINE) 2 % viscous mouth solution 15 mL  15 mL Oral Q4H PRN Mansy, Jan A, MD   15 mL at 10/23/19 2205  . azithromycin (ZITHROMAX) 500 mg in sodium chloride 0.9 % 250 mL IVPB  500 mg Intravenous Q24H Gwynne Edinger, MD 250 mL/hr at 10/25/19 1628 500 mg at 10/25/19 1628  . bisacodyl (DULCOLAX) suppository 10 mg  10 mg Rectal Daily PRN Mansy, Jan A, MD   10 mg at 10/24/19 0459  . cefTRIAXone (ROCEPHIN) 1 g in sodium chloride 0.9 % 100 mL IVPB  1 g Intravenous Q12H Wouk, Ailene Rud, MD   Stopped at 10/25/19 1436  . Chlorhexidine Gluconate Cloth 2 % PADS 6 each  6 each Topical Daily Gwynne Edinger, MD   6 each at 10/25/19 [phone]  . furosemide (LASIX)  250 mg in dextrose 5 % 250 mL (1 mg/mL) infusion  30 mg/hr Intravenous Continuous Judd Gaudier V, MD 30 mL/hr at 10/25/19 1600 30 mg/hr at 10/25/19 1600  . Glycerin (Adult) 2.1 g suppository 1 suppository  1 suppository Rectal Daily PRN Wouk, Ailene Rud, MD      . MEDLINE mouth rinse  15 mL Mouth Rinse BID Tu, Ching T, DO   15 mL at 10/24/19 1052  . morphine 2 MG/ML injection 1 mg  1 mg Intravenous Q4H PRN Mansy, Jan A, MD      . ondansetron [facility]) injection 4 mg  4 mg Intravenous Q8H PRN Wouk, Ailene Rud, MD   4 mg at 10/24/19 1901  . pantoprazole (PROTONIX) injection 40 mg  40 mg Intravenous BID Gwynne Edinger, MD   40 mg at 10/25/19 0949  . polyethylene glycol (MIRALAX / GLYCOLAX) packet 17 g  17 g Oral Daily Gwynne Edinger, MD   17 g at 10/24/19 1052  . sodium chloride flush (NS) 0.9 % injection 3 mL  3 mL Intravenous Q12H Tu, Ching T, DO   3 mL at 10/25/19 0944  . sodium chloride flush (NS) 0.9 % injection 3 mL  3 mL Intravenous PRN Tu, Ching T, DO  Past Medical History:  Diagnosis Date  . CHF (congestive heart failure) ([place])   . COVID-19   . Hyperlipidemia   . Hypertension   . Renal disorder     Past Surgical History:  Procedure Laterality Date  . RIGHT/LEFT HEART CATH AND CORONARY ANGIOGRAPHY N/A 12/30/2018   Procedure: RIGHT/LEFT HEART CATH AND CORONARY ANGIOGRAPHY;  Surgeon: Corey Skains, MD;  Location: [facility];  Service: Cardiovascular;  Laterality: N/A;    Social History Social History   Tobacco Use  . Smoking status: Current Every Day Smoker  . Smokeless tobacco: Never Used  Substance Use Topics  . Alcohol use: No    Alcohol/week: 0.0 standard drinks  . Drug use: Yes    Types: Cocaine    Comment: 3-4 days ago    Family History Family History  Problem Relation Age of Onset  . Breast cancer Neg Hx     Allergies  Allergen Reactions  . Penicillins Hives, Rash and Other (See Comments)    Did it involve swelling of the  face/tongue/throat, SOB, or low BP? No Did it involve sudden or severe rash/hives, skin peeling, or any reaction on the inside of your mouth or nose? Yes Did you need to seek medical attention at a hospital or doctor's office? Yes When did it last happen? >25 years If all above answers are "NO", may proceed with cephalosporin use.   . Ace Inhibitors Nausea And Vomiting     REVIEW OF SYSTEMS (Negative unless checked)  Constitutional: [] Weight loss  [] Fever  [] Chills Cardiac: [] Chest pain   [] Chest pressure   [] Palpitations   [] Shortness of breath when laying flat   [] Shortness of breath at rest   [] Shortness of breath with exertion. Vascular:  [] Pain in legs with walking   [] Pain in legs at rest   [] Pain in legs when laying flat   [] Claudication   [] Pain in feet when walking  [] Pain in feet at rest  [] Pain in feet when laying flat   [] History of DVT   [] Phlebitis   [] Swelling in legs   [] Varicose veins   [] Non-healing ulcers Pulmonary:   [] Uses home oxygen   [] Productive cough   [] Hemoptysis   [] Wheeze  [] COPD   [] Asthma Neurologic:  [] Dizziness  [] Blackouts   [] Seizures   [] History of stroke   [] History of TIA  [] Aphasia   [] Temporary blindness   [] Dysphagia   [] Weakness or numbness in arms   [] Weakness or numbness in legs Musculoskeletal:  [] Arthritis   [] Joint swelling   [] Joint pain   [] Low back pain Hematologic:  [] Easy bruising  [] Easy bleeding   [] Hypercoagulable state   [] Anemic  [] Hepatitis Gastrointestinal:  [] Blood in stool   [] Vomiting blood  [] Gastroesophageal reflux/heartburn   [] Difficulty swallowing. Genitourinary:  [] Chronic kidney disease   [] Difficult urination  [] Frequent urination  [] Burning with urination   [] Blood in urine Skin:  [] Rashes   [] Ulcers   [] Wounds Psychological:  [] History of anxiety   []  History of major depression.  Unable to obtain the review of systems due to the patient's severe systemic illness and altered mental status.       Physical  Examination  Vitals:   10/25/19 1306 10/25/19 1448 10/25/19 1519 10/25/19 1600  BP: 122/77   103/60  Pulse: 82 94 85 (!) 56  Resp:  20  (!) 26  Temp: 97.6 F (36.4 C) 97.8 F (36.6 C)    TempSrc: Oral Oral    SpO2: 97% 97% 100%  100%  Weight:  115 kg    Height:       Body mass index is 40.92 kg/m. VVO:HYWVPXTG distress Neck: Supple, no nuchal rigidity.  No JVD.  Pulmonary:  Increased work of breathing, accessory muscle use, crackles bilaterally  Cardiac: RRR, normal S1, S2, no Murmurs, rubs or gallops. Gastrointestinal: soft, mild tenderness,edema, anasarca No guarding/reflex.  Musculoskeletal: M/S 5/5 throughout.  Extremities without ischemic changes.  No deformity or atrophy. 3+ Edema in the lower extremities bilaterally Neurologic: Alert Psychiatric: Difficult to assess due to the severity of patient's illness. Dermatologic: No rashes or ulcers noted.   Lymph : No Cervical, Axillary, or Inguinal lymphadenopathy.      CBC Lab Results  Component Value Date   WBC 10.5 10/25/2019   HGB 13.2 10/25/2019   HCT 41.6 10/25/2019   MCV 74.4 (L) 10/25/2019   PLT 302 10/25/2019    BMET    Component Value Date/Time   NA 138 10/25/2019 0421   NA 141 06/08/2012 1847   K 5.0 10/25/2019 0421   K 3.5 06/08/2012 1847   CL 105 10/25/2019 0421   CL 110 (H) 06/08/2012 1847   CO2 22 10/25/2019 0421   CO2 26 06/08/2012 1847   GLUCOSE 119 (H) 10/25/2019 0421   GLUCOSE 83 06/08/2012 1847   BUN 61 (H) 10/25/2019 0421   BUN 15 06/08/2012 1847   CREATININE 3.25 (H) 10/25/2019 0421   CREATININE 1.54 (H) 06/08/2012 1847   CALCIUM 9.2 10/25/2019 0421   CALCIUM 9.7 06/08/2012 1847   GFRNONAA 14 (L) 10/25/2019 0421   GFRNONAA 37 (L) 06/08/2012 1847   GFRAA 38 (L) 09/02/2019 0558   GFRAA 43 (L) 06/08/2012 1847   Estimated Creatinine Clearance: 22.8 mL/min (A) (by C-G formula based on SCr of 3.25 mg/dL (H)).  COAG Lab Results  Component Value Date   INR 1.4 (H) 05/21/2019   INR  1.1 04/29/2019   INR 2.5 (H) 04/08/2019    Radiology DG Chest 2 View  Result Date: 10/19/2019 CLINICAL DATA:  Shortness of breath. EXAM: CHEST - 2 VIEW COMPARISON:  08/27/2019 and chest CT 08/15/2019 FINDINGS: Stable cardiomegaly. No overt pulmonary edema. Difficult to exclude densities at the left lung base and small left pleural effusion. Negative for a pneumothorax. IMPRESSION: Stable cardiomegaly with probable left basilar chest densities. Difficult to exclude a small left pleural effusion. Minimal change from the exam on 08/27/2019. Electronically Signed   By: Markus Daft M.D.   On: 10/19/2019 09:42   US Abdomen Complete  Result Date: 10/24/2019 CLINICAL DATA:  Abdominal pain. Mildly elevated bilirubin and lipase 4 months. EXAM: ABDOMEN ULTRASOUND COMPLETE COMPARISON:  None. FINDINGS: Gallbladder: The gallbladder wall is thickened measuring up to 5.5 mm. No stones, sludge, or Murphy's sign identified. Common bile duct: Diameter: 2.9 mm Liver: Diffuse coarsened echotexture with a nodular contour. No focal mass. Portal vein is patent on color Doppler imaging with normal direction of blood flow towards the liver. IVC: No abnormality visualized. Pancreas: Limited visualization due to bowel gas shadowing. Limited views of the pancreas are normal. Spleen: Size and appearance within normal limits. Right Kidney: Length: 9.7 cm. Contains 2 probable small cysts measuring 1.7 and 1.4 cm. Left Kidney: Length: 10.3 cm. Echogenicity within normal limits. No mass or hydronephrosis visualized. Abdominal aorta: 3.1 cm proximally Other findings: Ascites is seen in all 4 quadrants. Evaluation is limited due to labored breathing. IMPRESSION: 1. The gallbladder wall is thickened which is nonspecific given apparent signs of cirrhosis and  obvious ascites. The gallbladder is otherwise unremarkable. If there is concern for acute cholecystitis, recommend HIDA scan. 2. 4 quadrant ascites, moderate. 3. Coarsened echotexture to  the liver with a nodular contour suggesting cirrhosis. 4. Mild prominence of the proximal abdominal aorta measuring 3.1 cm. 5. Small cyst in the right kidney. Electronically Signed   By: Dorise Bullion III M.D   On: 10/24/2019 10:28   US RENAL  Result Date: 10/20/2019 CLINICAL DATA:  64 year old female with acute renal failure. EXAM: RENAL / URINARY TRACT ULTRASOUND COMPLETE COMPARISON:  CT abdomen pelvis dated 08/15/2019. FINDINGS: Right Kidney: Renal measurements: 10.9 x 4.6 x 4.9 cm = volume: 127 mL. There is mild diffuse increased echogenicity. No hydronephrosis or shadowing stone. There is a 1.7 x 1.1 x 1.1 cm lateral interpolar cyst. Left Kidney: Renal measurements: 10.1 x 4.8 x 5.3 cm = volume: 132 mL. Mild increased echogenicity. No hydronephrosis or shadowing stone. Bladder: The bladder is partially visualized. Other: There is a small ascites and anasarca. IMPRESSION: 1. Increased renal parenchymal echogenicity in keeping with chronic kidney disease. No hydronephrosis or shadowing stone. 2. Small ascites. Electronically Signed   By: Anner Crete M.D.   On: 10/20/2019 17:59   DG Chest Port 1 View  Result Date: 10/24/2019 CLINICAL DATA:  64 year old female admitted with heart failure. EXAM: PORTABLE CHEST 1 VIEW COMPARISON:  10/19/2019 FINDINGS: Marked cardiomegaly, unchanged. Cephalization of the pulmonary vasculature. Streaky bibasilar pulmonary opacities. Blunting of left costophrenic angle. No pneumothorax. No acute osseous abnormality. IMPRESSION: Mild pulmonary edema and probable trace left pleural effusion. Stable cardiomegaly. Electronically Signed   By: Ruthann Cancer MD   On: 10/24/2019 12:00   DG ABD ACUTE 2+V W 1V CHEST  Result Date: 10/25/2019 CLINICAL DATA:  Shortness of breath EXAM: DG ABDOMEN ACUTE W/ 1V CHEST COMPARISON:  02/21/2019 FINDINGS: Shallow lung inflation with mild cardiomegaly and left basilar consolidation. Bowel gas pattern is nonobstructive. No free  intraperitoneal air. IMPRESSION: Shallow lung inflation with left basilar consolidation, which may indicate atelectasis or pneumonia. Electronically Signed   By: Ulyses Jarred M.D.   On: 10/25/2019 00:21      Assessment/Plan 1. ARF 2. CRF III 3. CHF   We will proceed with temporary dialysis catheter placement at this time.  Risks and benefits discussed with patient and/or family, and the catheter will be placed to allow immediate initiation of dialysis.  If the patient's renal function does not improve throughout the hospital course, we will be happy to place a tunneled dialysis catheter for long term use prior to discharge.     Evaristo Bury, MD  10/25/2019 4:28 PM

## 2019-10-25 NOTE — Consult Note (Signed)
[facility] Vascular Consult Note  MRN : [id number]  Katelyn Lane is a 64 y.o. ([birth year]) female who presents with chief complaint of  Chief Complaint  Patient presents with  . Leg Swelling  .  History of Present Illness: Patient is admitted to the hospital with CRF III and has developed acute renal failure.  The patient reports shortness of breath.  The patient is critically ill with ARF, CHF, respiratory distress. The nephrology service has decided to initiate dialysis at this time, and we are asked to place a temporary dialysis catheter for immediate dialysis use.    Current Facility-Administered Medications  Medication Dose Route Frequency Provider Last Rate Last Admin  . 0.9 %  sodium chloride infusion  250 mL Intravenous PRN Tu, Ching T, DO 10 mL/hr at 10/25/19 1626 250 mL at 10/25/19 1626  . acetaminophen (TYLENOL) tablet 650 mg  650 mg Oral Q4H PRN Tu, Ching T, DO   650 mg at 10/24/19 1047  . alum & mag hydroxide-simeth (MAALOX/MYLANTA) 200-200-20 MG/5ML suspension 30 mL  30 mL Oral Q4H PRN Gwynne Edinger, MD   30 mL at 10/23/19 2105  . alum & mag hydroxide-simeth (MAALOX/MYLANTA) 200-200-20 MG/5ML suspension 30 mL  30 mL Oral Q4H PRN Mansy, Jan A, MD   30 mL at 10/23/19 2205   And  . lidocaine (XYLOCAINE) 2 % viscous mouth solution 15 mL  15 mL Oral Q4H PRN Mansy, Jan A, MD   15 mL at 10/23/19 2205  . azithromycin (ZITHROMAX) 500 mg in sodium chloride 0.9 % 250 mL IVPB  500 mg Intravenous Q24H Gwynne Edinger, MD 250 mL/hr at 10/25/19 1628 500 mg at 10/25/19 1628  . bisacodyl (DULCOLAX) suppository 10 mg  10 mg Rectal Daily PRN Mansy, Jan A, MD   10 mg at 10/24/19 0459  . cefTRIAXone (ROCEPHIN) 1 g in sodium chloride 0.9 % 100 mL IVPB  1 g Intravenous Q12H Wouk, Ailene Rud, MD   Stopped at 10/25/19 1436  . Chlorhexidine Gluconate Cloth 2 % PADS 6 each  6 each Topical Daily Gwynne Edinger, MD   6 each at 10/25/19 [phone]  . furosemide (LASIX)  250 mg in dextrose 5 % 250 mL (1 mg/mL) infusion  30 mg/hr Intravenous Continuous Judd Gaudier V, MD 30 mL/hr at 10/25/19 1600 30 mg/hr at 10/25/19 1600  . Glycerin (Adult) 2.1 g suppository 1 suppository  1 suppository Rectal Daily PRN Wouk, Ailene Rud, MD      . MEDLINE mouth rinse  15 mL Mouth Rinse BID Tu, Ching T, DO   15 mL at 10/24/19 1052  . morphine 2 MG/ML injection 1 mg  1 mg Intravenous Q4H PRN Mansy, Jan A, MD      . ondansetron [facility]) injection 4 mg  4 mg Intravenous Q8H PRN Wouk, Ailene Rud, MD   4 mg at 10/24/19 1901  . pantoprazole (PROTONIX) injection 40 mg  40 mg Intravenous BID Gwynne Edinger, MD   40 mg at 10/25/19 0949  . polyethylene glycol (MIRALAX / GLYCOLAX) packet 17 g  17 g Oral Daily Gwynne Edinger, MD   17 g at 10/24/19 1052  . sodium chloride flush (NS) 0.9 % injection 3 mL  3 mL Intravenous Q12H Tu, Ching T, DO   3 mL at 10/25/19 0944  . sodium chloride flush (NS) 0.9 % injection 3 mL  3 mL Intravenous PRN Tu, Ching T, DO  Past Medical History:  Diagnosis Date  . CHF (congestive heart failure) ([place])   . COVID-19   . Hyperlipidemia   . Hypertension   . Renal disorder     Past Surgical History:  Procedure Laterality Date  . RIGHT/LEFT HEART CATH AND CORONARY ANGIOGRAPHY N/A 12/30/2018   Procedure: RIGHT/LEFT HEART CATH AND CORONARY ANGIOGRAPHY;  Surgeon: Corey Skains, MD;  Location: [facility];  Service: Cardiovascular;  Laterality: N/A;    Social History Social History   Tobacco Use  . Smoking status: Current Every Day Smoker  . Smokeless tobacco: Never Used  Substance Use Topics  . Alcohol use: No    Alcohol/week: 0.0 standard drinks  . Drug use: Yes    Types: Cocaine    Comment: 3-4 days ago    Family History Family History  Problem Relation Age of Onset  . Breast cancer Neg Hx     Allergies  Allergen Reactions  . Penicillins Hives, Rash and Other (See Comments)    Did it involve swelling of the  face/tongue/throat, SOB, or low BP? No Did it involve sudden or severe rash/hives, skin peeling, or any reaction on the inside of your mouth or nose? Yes Did you need to seek medical attention at a hospital or doctor's office? Yes When did it last happen? >25 years If all above answers are "NO", may proceed with cephalosporin use.   . Ace Inhibitors Nausea And Vomiting     REVIEW OF SYSTEMS (Negative unless checked)  Constitutional: [] Weight loss  [] Fever  [] Chills Cardiac: [] Chest pain   [] Chest pressure   [] Palpitations   [] Shortness of breath when laying flat   [] Shortness of breath at rest   [] Shortness of breath with exertion. Vascular:  [] Pain in legs with walking   [] Pain in legs at rest   [] Pain in legs when laying flat   [] Claudication   [] Pain in feet when walking  [] Pain in feet at rest  [] Pain in feet when laying flat   [] History of DVT   [] Phlebitis   [] Swelling in legs   [] Varicose veins   [] Non-healing ulcers Pulmonary:   [] Uses home oxygen   [] Productive cough   [] Hemoptysis   [] Wheeze  [] COPD   [] Asthma Neurologic:  [] Dizziness  [] Blackouts   [] Seizures   [] History of stroke   [] History of TIA  [] Aphasia   [] Temporary blindness   [] Dysphagia   [] Weakness or numbness in arms   [] Weakness or numbness in legs Musculoskeletal:  [] Arthritis   [] Joint swelling   [] Joint pain   [] Low back pain Hematologic:  [] Easy bruising  [] Easy bleeding   [] Hypercoagulable state   [] Anemic  [] Hepatitis Gastrointestinal:  [] Blood in stool   [] Vomiting blood  [] Gastroesophageal reflux/heartburn   [] Difficulty swallowing. Genitourinary:  [] Chronic kidney disease   [] Difficult urination  [] Frequent urination  [] Burning with urination   [] Blood in urine Skin:  [] Rashes   [] Ulcers   [] Wounds Psychological:  [] History of anxiety   []  History of major depression.  Unable to obtain the review of systems due to the patient's severe systemic illness and altered mental status.       Physical  Examination  Vitals:   10/25/19 1306 10/25/19 1448 10/25/19 1519 10/25/19 1600  BP: 122/77   103/60  Pulse: 82 94 85 (!) 56  Resp:  20  (!) 26  Temp: 97.6 F (36.4 C) 97.8 F (36.6 C)    TempSrc: Oral Oral    SpO2: 97% 97% 100%  100%  Weight:  115 kg    Height:       Body mass index is 40.92 kg/m. PYP:PJKDTOIZ distress Neck: Supple, no nuchal rigidity.  No JVD.  Pulmonary:  Increased work of breathing, accessory muscle use, crackles bilaterally  Cardiac: RRR, normal S1, S2, no Murmurs, rubs or gallops. Gastrointestinal: soft, mild tenderness,edema, anasarca No guarding/reflex.  Musculoskeletal: M/S 5/5 throughout.  Extremities without ischemic changes.  No deformity or atrophy. 3+ Edema in the lower extremities bilaterally Neurologic: Alert Psychiatric: Difficult to assess due to the severity of patient's illness. Dermatologic: No rashes or ulcers noted.   Lymph : No Cervical, Axillary, or Inguinal lymphadenopathy.      CBC Lab Results  Component Value Date   WBC 10.5 10/25/2019   HGB 13.2 10/25/2019   HCT 41.6 10/25/2019   MCV 74.4 (L) 10/25/2019   PLT 302 10/25/2019    BMET    Component Value Date/Time   NA 138 10/25/2019 0421   NA 141 06/08/2012 1847   K 5.0 10/25/2019 0421   K 3.5 06/08/2012 1847   CL 105 10/25/2019 0421   CL 110 (H) 06/08/2012 1847   CO2 22 10/25/2019 0421   CO2 26 06/08/2012 1847   GLUCOSE 119 (H) 10/25/2019 0421   GLUCOSE 83 06/08/2012 1847   BUN 61 (H) 10/25/2019 0421   BUN 15 06/08/2012 1847   CREATININE 3.25 (H) 10/25/2019 0421   CREATININE 1.54 (H) 06/08/2012 1847   CALCIUM 9.2 10/25/2019 0421   CALCIUM 9.7 06/08/2012 1847   GFRNONAA 14 (L) 10/25/2019 0421   GFRNONAA 37 (L) 06/08/2012 1847   GFRAA 38 (L) 09/02/2019 0558   GFRAA 43 (L) 06/08/2012 1847   Estimated Creatinine Clearance: 22.8 mL/min (A) (by C-G formula based on SCr of 3.25 mg/dL (H)).  COAG Lab Results  Component Value Date   INR 1.4 (H) 05/21/2019   INR  1.1 04/29/2019   INR 2.5 (H) 04/08/2019    Radiology DG Chest 2 View  Result Date: 10/19/2019 CLINICAL DATA:  Shortness of breath. EXAM: CHEST - 2 VIEW COMPARISON:  08/27/2019 and chest CT 08/15/2019 FINDINGS: Stable cardiomegaly. No overt pulmonary edema. Difficult to exclude densities at the left lung base and small left pleural effusion. Negative for a pneumothorax. IMPRESSION: Stable cardiomegaly with probable left basilar chest densities. Difficult to exclude a small left pleural effusion. Minimal change from the exam on 08/27/2019. Electronically Signed   By: Markus Daft M.D.   On: 10/19/2019 09:42   US Abdomen Complete  Result Date: 10/24/2019 CLINICAL DATA:  Abdominal pain. Mildly elevated bilirubin and lipase 4 months. EXAM: ABDOMEN ULTRASOUND COMPLETE COMPARISON:  None. FINDINGS: Gallbladder: The gallbladder wall is thickened measuring up to 5.5 mm. No stones, sludge, or Murphy's sign identified. Common bile duct: Diameter: 2.9 mm Liver: Diffuse coarsened echotexture with a nodular contour. No focal mass. Portal vein is patent on color Doppler imaging with normal direction of blood flow towards the liver. IVC: No abnormality visualized. Pancreas: Limited visualization due to bowel gas shadowing. Limited views of the pancreas are normal. Spleen: Size and appearance within normal limits. Right Kidney: Length: 9.7 cm. Contains 2 probable small cysts measuring 1.7 and 1.4 cm. Left Kidney: Length: 10.3 cm. Echogenicity within normal limits. No mass or hydronephrosis visualized. Abdominal aorta: 3.1 cm proximally Other findings: Ascites is seen in all 4 quadrants. Evaluation is limited due to labored breathing. IMPRESSION: 1. The gallbladder wall is thickened which is nonspecific given apparent signs of cirrhosis and  obvious ascites. The gallbladder is otherwise unremarkable. If there is concern for acute cholecystitis, recommend HIDA scan. 2. 4 quadrant ascites, moderate. 3. Coarsened echotexture to  the liver with a nodular contour suggesting cirrhosis. 4. Mild prominence of the proximal abdominal aorta measuring 3.1 cm. 5. Small cyst in the right kidney. Electronically Signed   By: Dorise Bullion III M.D   On: 10/24/2019 10:28   US RENAL  Result Date: 10/20/2019 CLINICAL DATA:  64 year old female with acute renal failure. EXAM: RENAL / URINARY TRACT ULTRASOUND COMPLETE COMPARISON:  CT abdomen pelvis dated 08/15/2019. FINDINGS: Right Kidney: Renal measurements: 10.9 x 4.6 x 4.9 cm = volume: 127 mL. There is mild diffuse increased echogenicity. No hydronephrosis or shadowing stone. There is a 1.7 x 1.1 x 1.1 cm lateral interpolar cyst. Left Kidney: Renal measurements: 10.1 x 4.8 x 5.3 cm = volume: 132 mL. Mild increased echogenicity. No hydronephrosis or shadowing stone. Bladder: The bladder is partially visualized. Other: There is a small ascites and anasarca. IMPRESSION: 1. Increased renal parenchymal echogenicity in keeping with chronic kidney disease. No hydronephrosis or shadowing stone. 2. Small ascites. Electronically Signed   By: Anner Crete M.D.   On: 10/20/2019 17:59   DG Chest Port 1 View  Result Date: 10/24/2019 CLINICAL DATA:  64 year old female admitted with heart failure. EXAM: PORTABLE CHEST 1 VIEW COMPARISON:  10/19/2019 FINDINGS: Marked cardiomegaly, unchanged. Cephalization of the pulmonary vasculature. Streaky bibasilar pulmonary opacities. Blunting of left costophrenic angle. No pneumothorax. No acute osseous abnormality. IMPRESSION: Mild pulmonary edema and probable trace left pleural effusion. Stable cardiomegaly. Electronically Signed   By: Ruthann Cancer MD   On: 10/24/2019 12:00   DG ABD ACUTE 2+V W 1V CHEST  Result Date: 10/25/2019 CLINICAL DATA:  Shortness of breath EXAM: DG ABDOMEN ACUTE W/ 1V CHEST COMPARISON:  02/21/2019 FINDINGS: Shallow lung inflation with mild cardiomegaly and left basilar consolidation. Bowel gas pattern is nonobstructive. No free  intraperitoneal air. IMPRESSION: Shallow lung inflation with left basilar consolidation, which may indicate atelectasis or pneumonia. Electronically Signed   By: Ulyses Jarred M.D.   On: 10/25/2019 00:21      Assessment/Plan 1. ARF 2. CRF III 3. CHF   We will proceed with temporary dialysis catheter placement at this time.  Risks and benefits discussed with patient and/or family, and the catheter will be placed to allow immediate initiation of dialysis.  If the patient's renal function does not improve throughout the hospital course, we will be happy to place a tunneled dialysis catheter for long term use prior to discharge.     Evaristo Bury, MD  10/25/2019 4:28 PM

## 2019-10-25 NOTE — Procedures (Signed)
  OPERATIVE NOTE   PROCEDURE: 1. Ultrasound guidance for vascular access RIGHT FEMORAL vein 2. Placement of a 20cm dialysis catheter RIGHT FEMORAL vein  PRE-OPERATIVE DIAGNOSIS: 1. Acute renal failure 2. Respiratory distress, CHF  POST-OPERATIVE DIAGNOSIS: Same  Surgeon: Jamesetta So, MD  ASSISTANT(S): None  ANESTHESIA: local  ESTIMATED BLOOD LOSS: Minimal   FINDING(S): 1. None  SPECIMEN(S): None  INDICATIONS:  Patient is a 64 y.o.female who presents with ARF CKD III, respiratory distress, CHF.  Risks and benefits were discussed, and informed consent was obtained..  DESCRIPTION: After obtaining full informed written consent, the patient was laid flat in the bed. The RIGHT groin was sterilely prepped and draped in a sterile surgical field was created. The right femoral  vein was visualized with ultrasound and found to be widely patent. It was then accessed under direct guidance without difficulty with a Seldinger needle and a permanent image was recorded. A J-wire was then placed. After skin nick and dilatation, a 20cm dialysis catheter was placed over the wire and the wire was removed. The lumens withdrew dark red nonpulsatile blood and flushed easily with sterile saline. The catheter was secured to the skin with 3 nylon sutures. Sterile dressing was placed.  COMPLICATIONS: None  CONDITION: Stable  Atalie Oros A 10/25/2019 4:37 PM  This note was created with Dragon Medical transcription system. Any errors in dictation are purely unintentional.

## 2019-10-25 NOTE — Progress Notes (Addendum)
Shift summary:  - Patient transferred to CCU from PCU for emergent line placement and H/D.  - Patient not tolerating BiPAP despite respiratory difficulty.  - HD line placed at bedside this afternoon.   - Plan for H/D tonight.

## 2019-10-26 ENCOUNTER — Inpatient Hospital Stay: Payer: Medicaid Other

## 2019-10-26 ENCOUNTER — Other Ambulatory Visit (INDEPENDENT_AMBULATORY_CARE_PROVIDER_SITE_OTHER): Payer: Self-pay | Admitting: Vascular Surgery

## 2019-10-26 DIAGNOSIS — I5041 Acute combined systolic (congestive) and diastolic (congestive) heart failure: Secondary | ICD-10-CM | POA: Diagnosis not present

## 2019-10-26 LAB — CBC
HCT: 34.8 % — ABNORMAL LOW (ref 36.0–46.0)
Hemoglobin: 11.4 g/dL — ABNORMAL LOW (ref 12.0–15.0)
MCH: 23.8 pg — ABNORMAL LOW (ref 26.0–34.0)
MCHC: 32.8 g/dL (ref 30.0–36.0)
MCV: 72.7 fL — ABNORMAL LOW (ref 80.0–100.0)
Platelets: 260 10*3/uL (ref 150–400)
RBC: 4.79 MIL/uL (ref 3.87–5.11)
RDW: 22.9 % — ABNORMAL HIGH (ref 11.5–15.5)
WBC: 10.7 10*3/uL — ABNORMAL HIGH (ref 4.0–10.5)
nRBC: 0 % (ref 0.0–0.2)

## 2019-10-26 LAB — COMPREHENSIVE METABOLIC PANEL
ALT: 8 U/L (ref 0–44)
AST: 16 U/L (ref 15–41)
Albumin: 2.6 g/dL — ABNORMAL LOW (ref 3.5–5.0)
Alkaline Phosphatase: 73 U/L (ref 38–126)
Anion gap: 10 (ref 5–15)
BUN: 61 mg/dL — ABNORMAL HIGH (ref 8–23)
CO2: 24 mmol/L (ref 22–32)
Calcium: 9 mg/dL (ref 8.9–10.3)
Chloride: 105 mmol/L (ref 98–111)
Creatinine, Ser: 3.25 mg/dL — ABNORMAL HIGH (ref 0.44–1.00)
GFR, Estimated: 14 mL/min — ABNORMAL LOW (ref >60)
Glucose, Bld: 91 mg/dL (ref 70–99)
Potassium: 4.7 mmol/L (ref 3.5–5.1)
Sodium: 139 mmol/L (ref 135–145)
Total Bilirubin: 2.2 mg/dL — ABNORMAL HIGH (ref 0.3–1.2)
Total Protein: 5.4 g/dL — ABNORMAL LOW (ref 6.5–8.1)

## 2019-10-26 LAB — AMYLASE, PLEURAL OR PERITONEAL FLUID: Amylase, Fluid: 630 U/L

## 2019-10-26 LAB — BODY FLUID CELL COUNT WITH DIFFERENTIAL
Lymphs, Fluid: 1 %
Monocyte-Macrophage-Serous Fluid: 1 %
Neutrophil Count, Fluid: 98 %
Total Nucleated Cell Count, Fluid: 5644 cu mm

## 2019-10-26 LAB — LACTATE DEHYDROGENASE, PLEURAL OR PERITONEAL FLUID: LD, Fluid: 277 U/L — ABNORMAL HIGH (ref 3–23)

## 2019-10-26 LAB — ALBUMIN, PLEURAL OR PERITONEAL FLUID: Albumin, Fluid: 1.6 g/dL

## 2019-10-26 LAB — GLUCOSE, PLEURAL OR PERITONEAL FLUID: Glucose, Fluid: 66 mg/dL

## 2019-10-26 LAB — PROTEIN, PLEURAL OR PERITONEAL FLUID: Total protein, fluid: 3.3 g/dL

## 2019-10-26 MED ORDER — MIDODRINE HCL 5 MG PO TABS
2.5000 mg | ORAL_TABLET | Freq: Three times a day (TID) | ORAL | Status: DC | PRN
  Administered 2019-10-26 – 2019-11-04 (×3): 2.5 mg via ORAL
  Filled 2019-10-26 (×3): qty 1

## 2019-10-26 MED ORDER — SODIUM CHLORIDE 0.9 % IV SOLN
1.0000 g | Freq: Once | INTRAVENOUS | Status: AC
  Administered 2019-10-26: 1 g via INTRAVENOUS
  Filled 2019-10-26: qty 10

## 2019-10-26 MED ORDER — METRONIDAZOLE IN NACL 5-0.79 MG/ML-% IV SOLN
500.0000 mg | Freq: Three times a day (TID) | INTRAVENOUS | Status: DC
  Administered 2019-10-26 – 2019-10-29 (×10): 500 mg via INTRAVENOUS
  Filled 2019-10-26 (×12): qty 100

## 2019-10-26 MED ORDER — SODIUM CHLORIDE 0.9 % IV SOLN
2.0000 g | Freq: Every day | INTRAVENOUS | Status: DC
  Administered 2019-10-27 – 2019-10-31 (×5): 2 g via INTRAVENOUS
  Filled 2019-10-26: qty 20
  Filled 2019-10-26: qty 2
  Filled 2019-10-26: qty 20
  Filled 2019-10-26 (×3): qty 2

## 2019-10-26 MED ORDER — IOHEXOL 9 MG/ML PO SOLN: 500.0000 mL | ORAL | Status: DC

## 2019-10-26 MED ORDER — IOHEXOL 9 MG/ML PO SOLN
500.0000 mL | ORAL | Status: AC
  Administered 2019-10-26 (×2): 500 mL via ORAL

## 2019-10-26 MED ORDER — ALBUMIN HUMAN 25 % IV SOLN
25.0000 g | Freq: Once | INTRAVENOUS | Status: AC
  Administered 2019-10-26: 25 g via INTRAVENOUS
  Filled 2019-10-26: qty 100

## 2019-10-26 MED ORDER — SODIUM CHLORIDE 0.9 % IV SOLN
2.0000 g | INTRAVENOUS | Status: DC
  Filled 2019-10-26: qty 20

## 2019-10-26 NOTE — Evaluation (Addendum)
Clinical/Bedside Swallow Evaluation Patient Details  Name: Katelyn Lane MRN: [id number] Date of Birth: 07/06/1955  Today's Date: 10/26/2019 Time: SLP Start Time (ACUTE ONLY): 1210 SLP Stop Time (ACUTE ONLY): 1300 SLP Time Calculation (min) (ACUTE ONLY): 50 min  Past Medical History:  Past Medical History:  Diagnosis Date  . CHF (congestive heart failure) ([place])   . COVID-19   . Hyperlipidemia   . Hypertension   . Renal disorder    Past Surgical History:  Past Surgical History:  Procedure Laterality Date  . RIGHT/LEFT HEART CATH AND CORONARY ANGIOGRAPHY N/A 12/30/2018   Procedure: RIGHT/LEFT HEART CATH AND CORONARY ANGIOGRAPHY;  Surgeon: Corey Skains, MD;  Location: [facility];  Service: Cardiovascular;  Laterality: N/A;   HPI:  Pt  is a 64 y.o. 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 CXR (10/16): Mild pulmonary edema and probable trace left pleural effusion. 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 That area has lost its restricted diffusion as expected with normal evolutionary change. There is low level restricted diffusion now seen in the immediately adjacent underlying subcortical white matter, probably representing a minor extension of the insult.  Assessment / Plan / Recommendation Clinical Impression  Pt was seen for bedside swallow evaluation. Pt presents w/ mild increased risk of Dysphagia/Aspiration d/t declined mental status (confusion, irritability, poor insight). Per MRI: In January, the patient had an area of acute/subacute infarction at the left frontoparietal junction cortical and subcortical brain. Per MD  note (10/18): "[pt] not competent enough to make her own decisions according to the psych evaluation done during recent hospitalization". Unsure of pt cognitive baseline.  Oral mech exam reveals adequate lingual strength & ROM; adequate labial seal; upper dentures worn during eval-- lower dentition adequate; missing molars. Pt observed to have a gravely vocal quality baseline. No overt clinical s/s of aspiration noted during: 8x/ice chips via spoon, 10x/puree via spoon, 8x/solids via hand, 15x/thin liquids via straw. Pt required assist for upright positioning in bed, but able to self feed all trial consistencies. Speech clear between & after all PO trials. Pt required mod intermittent supervision to cue for compensatory strategies for safe swallowing-- especially to take small/slow sips, and take rest breaks. Pt appears mod impulsive w/ PO trials, especially thin liquids via straw. At end of session as clinicians were exiting room, pt exhibited productive cough during thin liquid PO's via straw w/ no supervision. Pt expectorated & straws were not recommended if continued coughing during use.  Unsure of pt cognitive baseline-- however, pt required min-mod cues to reorient to task & cooperate. Throughout eval, pt demonstrated behaviors indicative of altered mental status including-- impulsivity, agitation, & distractibility. Given mod verbal cues to reorient to task, pt was cooperative. Any change in mental status/cognition can increase a pt's risk for oropharyngeal dysphagia. Recommend consult w/ psych to address pt mental status/cognition.   Pt appears at reduced risk for aspiration following general aspiration precautions; Recommend Age-appropriate Regular Diet; NO STRAWS if continued coughing noted. Emphasis on safe swallowing strategies of: reducing distractions, taking rest breaks & taking slow/small sips. Pt requires intermittent supervision at mealtime to cue for compensatory strategies d/t apparent  impulsivity-- especially with thin liquids. Recommend consult w/ MD to address  pt mental status/cognition as this appears to impact her behavior (noted drug use per H&P). MD & NSG updated. Education provided to pt. ST to f/u toleration of diet.   SLP Visit Diagnosis: Dysphagia, unspecified (R13.10)    Aspiration Risk  Mild aspiration risk    Diet Recommendation  Recommend Age-appropriate Regular Diet; NO STRAWS.  Medication Administration: Whole meds with puree    Other  Recommendations Oral Care Recommendations: Oral care BID   Follow up Recommendations Skilled Nursing facility      Frequency and Duration min 2x/week  1 week       Prognosis Prognosis for Safe Diet Advancement: Good Barriers to Reach Goals: Cognitive deficits;Severity of deficits;Behavior      Swallow Study   General Date of Onset: 10/19/19 HPI: Pt  is a 64 y.o. 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 Type of Study: Bedside Swallow Evaluation Previous Swallow Assessment:  (06/20/2019) Diet Prior to this Study: Regular (NPO 10/15-10/16) Temperature Spikes Noted: No Respiratory Status: Nasal cannula (6L at admit-- 2L currently) History of Recent Intubation: No Behavior/Cognition: Alert;Cooperative;Confused;Agitated;Impulsive;Distractible;Requires cueing Oral Cavity Assessment: Within Functional Limits Oral Care Completed by SLP: Yes (pt able to complete her own oral care) Oral Cavity - Dentition: Dentures, top;Missing dentition (bottom dentition adequate but missing molars) Vision: Functional for self-feeding Self-Feeding Abilities: Able to feed self;Needs assist;Needs set up Patient Positioning: Upright in bed Baseline Vocal Quality: Hoarse (hoarse/gravely vocal  quality baseline)    Oral/Motor/Sensory Function Overall Oral Motor/Sensory Function: Within functional limits   Ice Chips Ice chips: Within functional limits Presentation: Spoon Other Comments: 8x   Thin Liquid Thin Liquid: Within functional limits Presentation: Cup;Self Fed;Straw Other Comments: 15x    Nectar Thick Nectar Thick Liquid: Not tested   Honey Thick Honey Thick Liquid: Not tested   Puree Puree: Within functional limits Presentation: Self Fed;Spoon Other Comments: 10x   Solid     Solid: Within functional limits Presentation: Self Fed Other Comments: 8x      [place] Clinician 10/26/2019,2:32 PM  The information in this patient note, response to treatment, and overall treatment plan developed has been reviewed and agreed upon by this clinician.  [name], MS, [address] Speech Language Pathologist Rehab Services [phone]

## 2019-10-26 NOTE — Progress Notes (Signed)
[facility] Gastroenterology Inpatient Progress Note  Subjective: Patient seen for f/u UGI bleed, ascites. Patient seen in the hemodialysis unit. BP lowering despite IV albumin per dialysis RN, Steffanie Dunn. Patient denies pain.  Objective: Vital signs in last 24 hours: Temp:  [97.4 F (36.3 C)-97.8 F (36.6 C)] 97.8 F (36.6 C) (10/18 1515) Pulse Rate:  [40-104] 97 (10/18 1000) Resp:  [19-35] 22 (10/18 1400) BP: (82-130)/(48-86) 98/72 (10/18 1515) SpO2:  [93 %-100 %] 95 % (10/18 1530) Blood pressure 98/72, pulse 97, temperature 97.8 F (36.6 C), temperature source Axillary, resp. rate (!) 22, height 5\' 6"  (1.676 m), weight 115 kg, SpO2 95 %.    Intake/Output from previous day: 10/17 0701 - 10/18 0700 In: 1000.9 [I.V.:550.9; IV Piggyback:450] Out: 670 [Urine:170]  Intake/Output this shift: Total I/O In: 20 [I.V.:20] Out: -    General appearance: Somnolent, NAD.  Resp: CTA Cardio:  rrr No gallop GI:  Distended with fluid wave. BS+ No masses or rebound. Extremities:  1-2 + edema.   Lab Results: Results for orders placed or performed during the hospital encounter of 10/19/19 (from the past 24 hour(s))  Comprehensive metabolic panel     Status: Abnormal   Collection Time: 10/26/19  6:45 AM  Result Value Ref Range   Sodium 139 135 - 145 mmol/L   Potassium 4.7 3.5 - 5.1 mmol/L   Chloride 105 98 - 111 mmol/L   CO2 24 22 - 32 mmol/L   Glucose, Bld 91 70 - 99 mg/dL   BUN 61 (H) 8 - 23 mg/dL   Creatinine, Ser 3.25 (H) 0.44 - 1.00 mg/dL   Calcium 9.0 8.9 - 10.3 mg/dL   Total Protein 5.4 (L) 6.5 - 8.1 g/dL   Albumin 2.6 (L) 3.5 - 5.0 g/dL   AST 16 15 - 41 U/L   ALT 8 0 - 44 U/L   Alkaline Phosphatase 73 38 - 126 U/L   Total Bilirubin 2.2 (H) 0.3 - 1.2 mg/dL   GFR, Estimated 14 (L) >60 mL/min   Anion gap 10 5 - 15  CBC     Status: Abnormal   Collection Time: 10/26/19  6:45 AM  Result Value Ref Range   WBC 10.7 (H) 4.0 - 10.5 K/uL   RBC 4.79 3.87 - 5.11 MIL/uL    Hemoglobin 11.4 (L) 12.0 - 15.0 g/dL   HCT 34.8 (L) 36 - 46 %   MCV 72.7 (L) 80.0 - 100.0 fL   MCH 23.8 (L) 26.0 - 34.0 pg   MCHC 32.8 30.0 - 36.0 g/dL   RDW 22.9 (H) 11.5 - 15.5 %   Platelets 260 150 - 400 K/uL   nRBC 0.0 0.0 - 0.2 %  Lactate dehydrogenase (pleural or peritoneal fluid)     Status: Abnormal   Collection Time: 10/26/19 11:30 AM  Result Value Ref Range   LD, Fluid 277 (H) 3 - 23 U/L   Fluid Type-FLDH PERITONEAL   Body fluid cell count with differential     Status: Abnormal   Collection Time: 10/26/19 11:30 AM  Result Value Ref Range   Fluid Type-FCT PERITONEAL    Color, Fluid YELLOW (A) YELLOW   Appearance, Fluid CLEAR (A) CLEAR   Total Nucleated Cell Count, Fluid 5,644 cu mm   Neutrophil Count, Fluid 98 %   Lymphs, Fluid 1 %   Monocyte-Macrophage-Serous Fluid 1 %  Albumin, pleural or peritoneal fluid     Status: None   Collection Time: 10/26/19 11:30 AM  Result Value  Ref Range   Albumin, Fluid 1.6 g/dL   Fluid Type-FALB PERITONEAL   Glucose, pleural or peritoneal fluid     Status: None   Collection Time: 10/26/19 11:30 AM  Result Value Ref Range   Glucose, Fluid 66 mg/dL   Fluid Type-FGLU PERITONEAL   Amylase, pleural or peritoneal fluid     Status: None   Collection Time: 10/26/19 11:30 AM  Result Value Ref Range   Amylase, Fluid 630 U/L   Fluid Type-FAMY Peritoneal   Protein, pleural or peritoneal fluid     Status: None   Collection Time: 10/26/19 11:30 AM  Result Value Ref Range   Total protein, fluid 3.3 g/dL   Fluid Type-FTP Peritoneal      Recent Labs    10/25/19 0018 10/25/19 0421 10/26/19 0645  WBC 10.5 10.5 10.7*  HGB 13.4 13.2 11.4*  HCT 42.3 41.6 34.8*  PLT 307 302 260   BMET Recent Labs    10/25/19 0018 10/25/19 0421 10/26/19 0645  NA 138 138 139  K 5.4* 5.0 4.7  CL 105 105 105  CO2 23 22 24   GLUCOSE 124* 119* 91  BUN 61* 61* 61*  CREATININE 3.27* 3.25* 3.25*  CALCIUM 9.5 9.2 9.0   LFT Recent Labs     10/26/19 0645  PROT 5.4*  ALBUMIN 2.6*  AST 16  ALT 8  ALKPHOS 73  BILITOT 2.2*   PT/INR No results for input(s): LABPROT, INR in the last 72 hours. Hepatitis Panel Recent Labs    10/25/19 0421 10/25/19 1119  HEPBSAG  --  NON REACTIVE  HCVAB NON REACTIVE  --    C-Diff No results for input(s): CDIFFTOX in the last 72 hours. No results for input(s): CDIFFPCR in the last 72 hours.   Studies/Results: US Paracentesis  Result Date: 10/26/2019 INDICATION: Ascites, distension EXAM: ULTRASOUND GUIDED DIAGNOSTIC AND THERAPEUTIC PARACENTESIS MEDICATIONS: 1% LIDOCAINE LOCAL COMPLICATIONS: None immediate. PROCEDURE: Informed written consent was obtained from the patient's sister by phone and verified by ultrasound staff after a discussion of the risks, benefits and alternatives to treatment. A timeout was performed prior to the initiation of the procedure. Initial ultrasound scanning demonstrates a large amount of ascites within the left lower abdominal quadrant. The left lower abdomen was prepped and draped in the usual sterile fashion. 1% lidocaine was used for local anesthesia. Following this, a 6 Fr Safe-T-Centesis catheter was introduced. An ultrasound image was saved for documentation purposes. The paracentesis was performed. The catheter was removed and a dressing was applied. The patient tolerated the procedure well without immediate post procedural complication. FINDINGS: A total of approximately 6.4 L of yellow peritoneal fluid was removed. Samples were sent to the laboratory as requested by the clinical team. IMPRESSION: Successful ultrasound-guided paracentesis yielding 6.4 liters of peritoneal fluid. Electronically Signed   By: Jerilynn Mages.  Shick M.D.   On: 10/26/2019 12:36   DG ABD ACUTE 2+V W 1V CHEST  Result Date: 10/25/2019 CLINICAL DATA:  Shortness of breath EXAM: DG ABDOMEN ACUTE W/ 1V CHEST COMPARISON:  02/21/2019 FINDINGS: Shallow lung inflation with mild cardiomegaly and left basilar  consolidation. Bowel gas pattern is nonobstructive. No free intraperitoneal air. IMPRESSION: Shallow lung inflation with left basilar consolidation, which may indicate atelectasis or pneumonia. Electronically Signed   By: Ulyses Jarred M.D.   On: 10/25/2019 00:21    Scheduled Inpatient Medications:   . Chlorhexidine Gluconate Cloth  6 each Topical Daily  . mouth rinse  15 mL Mouth Rinse BID  .  pantoprazole (PROTONIX) IV  40 mg Intravenous BID  . polyethylene glycol  17 g Oral Daily  . sodium chloride flush  3 mL Intravenous Q12H    Continuous Inpatient Infusions:   . sodium chloride 10 mL/hr at 10/26/19 0700  . azithromycin 500 mg (10/26/19 1410)  . cefTRIAXone (ROCEPHIN)  IV    . [START ON 10/27/2019] cefTRIAXone (ROCEPHIN)  IV    . metronidazole 500 mg (10/26/19 1028)    PRN Inpatient Medications:  sodium chloride, acetaminophen, alum & mag hydroxide-simeth, alum & mag hydroxide-simeth **AND** lidocaine, bisacodyl, Glycerin (Adult), midodrine, morphine injection, ondansetron (ZOFRAN) IV, sodium chloride flush    Assessment:  1. Ascites - SBP vs. Secondary bacterial peritonitis. Patient has elevated fluid amylase and LDH suggesting secondary bacterial peritonitis secondary to ? Bowel injury/perforation. No peritoneal signs on exam. 2. UGI bleed - historical - Hgb stable at 11.4. On octreotide. 3. Hypotension - Relative to renal failure, cirrhosis, now concerned about sepsis given peritoneal fluid analysis. 4. CHF. 5. Polysubtance abuse.   Plan:  1. Await cultures, continue IV antibiotics. 2. Monitor H/H. Continue IV octreotide. 3. Case discussed with Dr. Si Raider, Attending. 4. Continuing to follow.  Riham Polyakov K. Alice Reichert, M.D. 10/26/2019, 5:32 PM

## 2019-10-26 NOTE — Progress Notes (Signed)
1438 To dialysis via bed.

## 2019-10-26 NOTE — Progress Notes (Addendum)
PROGRESS NOTE    Katelyn Lane  JQB:[id number] DOB: 01/16/1955 DOA: 10/19/2019 PCP: [name], MD   Brief Narrative: Taken from H&P and chart review. Katelyn Lane is a 64 y.o. 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 Found to have BNP greater than 4500, elevated troponin and AKI.  Multiple prior admissions with similar symptoms and complaints.  Prolonged hospitalization in August 2021 when she was discharged to SNF.  Not clear when she get out of SNF.  Continues to complain that she is homeless but per social worker she do have a house.  Not take her medication despite being provided with medical management.   Subjective: This morning no specific complaints.says breathing is unchanged. No n/v, has appetite. Denies pain.  Assessment & Plan:   Principal Problem:   Acute CHF (congestive heart failure) (HCC) Active Problems:   AKI (acute kidney injury) ([place])   Crack cocaine use   Acute respiratory failure with hypoxia (HCC)   LV (left ventricular) mural thrombus   Cocaine abuse ([place])   Peripheral edema   Cirrhosis of liver ([place])  Concern for Upper GI bleed Overnight 10/15 several episodes dark emesis, gastroccult positive. No apparent hx of GI bleed, recent dx of cirrhosis does not appear has been evaluated for varices. Vomiting can be side effect of lasix and was on high dose gtt. No further episodes of emesis. No amemia, h/h stable. - home rivaroxaban on hold - s/p pantop 80, cont 40 iv bid - cont prophylactic ceftriaxone 1 g daily (10/17> - gi consulted, appreciate recs - continue octreotide - possible EGD while inpatient  Acute on chronic combined systolic and diastolic heart failure. Acute respiratory failure   Patient with EF of 25 to 30% and grade 3 diastolic dysfunction on echocardiogram done in June 2021.  Multiple admissions for similar symptoms and being noncompliant.  Continue to use cocaine. Mild trop elevation, likely demand. Bipap attempted 10/17 given worsening respiratory status but pt did not tolerate. -trial of lasix gtt with albumin infusion not successful - given poor response to diuretic and worsening respiratory status, temporary dialysis catheter placed right femoral vein 10/17 with dialysis evening of 10/17. Patient did confirm with me she is full code. Plan for repeat dialysis today -Continue to monitor daily weight and BMP. -Strict intake and output. 175 ml urine ysterday - hyolding entresto and bb until bp stable - cardiology following, appreciate recs  Aspiration pneumonitis/pneumonia - worsening respiratory status 10/17 (tachypnea, increased WOB), cxr with poss LL consolidation, in setting of several episodes emisis overnight 10/16. procalcitonin markedly elevated (though sig ckd present). - started on ceftriaxone (10/17> ) as above, added azithromycin (10/17> ) and metronidazole (10/18> ) given concern for poss aspiration - checked blood cultures, ngtd - SLP swallow eval  Cirrhosis Abdominal pain -10/15 complaining of diffuse abd pain, no other symptoms. Labs showed no elevated WBCs, chronically elevated bili. Mild lipase elevation, slightly improved today. U/s shows mod ascites, non-specific gallbladder wall thickening, cirrhotic liver. Nodular liver was seen on u/s of august of this year. Suspect ascites consequence of some combination of chf and cirrhosis. Cirrhosis may be 2/2 nafld, possible alcohol use. Hep b negative on recent labs - IR consult for fluid analysis as this is a new dx  Acute hypoxic respiratory failure COPD Rrequiring 6 L  on admission, o2 off oxygen was 89%, now weaned to 2 L, likely 2/2 above cardiac process.  - treatment as above  Hypotension - low  normal pressures - diuresis as above - holding bb and entresto as above - resume home midodrine  AKI with CKD stage IIIb/IV. Likely cardiorenal syndrome, hepatorenal may contribute given new dx of likely cirrhosis.  baseline around 2. Cr worsening to 3.25 today, K wnl, bicarb normal. Oliguric -nephrology following, appreciate recs.  - dialysis as above, has temporary dialysis catheter -Continue to monitor renal function. -Avoid nephrotoxins. - will need permanent vas cath placement this week  Cocaine use.  According to H&P last use was 2 to 3 days prior to admission.  UDS positive for cocaine.  Patient refused to answer any questions regarding her cocaine use .  This will complicate overall prognosis and care.  History of ventricular thrombus in 01/2019 and hx of CVA - off xarelto as above  Social issues.  Apparently she was not competent enough to make her own decisions according to the psych evaluation done during recent hospitalization. -will involve psychiatry; social work will also attempt to contact patient's APS caseworker   Objective: Vitals:   10/26/19 0315 10/26/19 0400 10/26/19 0630 10/26/19 0700  BP: 105/74 (!) 91/58  (!) 86/66  Pulse: (!) 104 93 90 98  Resp: (!) 25 (!) 25 (!) 25 19  Temp: 97.6 F (36.4 C)     TempSrc: Oral     SpO2: 98% 93% 95% 96%  Weight:      Height:        Intake/Output Summary (Last 24 hours) at 10/26/2019 0905 Last data filed at 10/26/2019 0700 Gross per 24 hour  Intake 1000.86 ml  Output 670 ml  Net 330.86 ml   Filed Weights   10/23/19 0423 10/23/19 2224 10/25/19 1448  Weight: 115.8 kg 115.6 kg 115 kg    Examination:  General exam: sitting up, tachypnic, appears comforable but tachypnic Respiratory system: scattered exp wheezes, tachypnic Cardiovascular system: S1 & S2 heard, RRR. Soft systolic murmur. Distant heart sounds Gastrointestinal system: Soft, diffuse mild ttp, no rebound Central nervous system: Alert and oriented. No  focal neurological deficits. Extremities: pitting edema to thighs Psychiatry: Judgement and insight appear impaired.   DVT prophylaxis: holding for now given concern for ugib Code Status: Full Family Communication: none at bedside Disposition Plan:  Status is: Inpatient  Remains inpatient appropriate because:Inpatient level of care appropriate due to severity of illness   Dispo: The patient is from: Home/homeless              Anticipated d/c is to: To be determined              Anticipated d/c date is: 4-7 days              Patient currently is not medically stable to d/c.   Consultants:   Nephrology, cardiology, GI  Procedures: renal u/s Antimicrobials: none  Data Reviewed: I have personally reviewed following labs and imaging studies  CBC: Recent Labs  Lab 10/19/19 0935 10/23/19 0450 10/25/19 0018 10/25/19 0421 10/26/19 0645  WBC 5.7 4.4 10.5 10.5 10.7*  HGB 12.2 11.7* 13.4 13.2 11.4*  HCT 38.5 36.3 42.3 41.6 34.8*  MCV 75.2* 74.4* 75.9* 74.4* 72.7*  PLT 331 302 307 302 299   Basic Metabolic Panel: Recent Labs  Lab 10/23/19 0450 10/24/19 0545 10/25/19 0018 10/25/19 0421 10/25/19 1119 10/26/19 0645  NA 139 138 138 138  --  139  K 4.0 4.7 5.4* 5.0  --  4.7  CL 107 103 105 105  --  105  CO2 23 22 23 22   --  24  GLUCOSE 130* 117* 124* 119*  --  91  BUN 57* 55* 61* 61*  --  61*  CREATININE 2.67* 2.81* 3.27* 3.25*  --  3.25*  CALCIUM 8.6* 9.4 9.5 9.2  --  9.0  MG  --   --  3.0*  --   --   --   PHOS  --   --   --   --  4.2  --    GFR: Estimated Creatinine Clearance: 22.8 mL/min (A) (by C-G formula based on SCr of 3.25 mg/dL (H)). Liver Function Tests: Recent Labs  Lab 10/23/19 0450 10/24/19 0545 10/25/19 0421 10/26/19 0645  AST 23 25 17 16   ALT 15 14 10 8   ALKPHOS 119 113 88 73  BILITOT 1.4* 2.3* 2.2* 2.2*  PROT 6.0* 7.0 6.1* 5.4*  ALBUMIN 2.5*  2.5* 3.2* 3.0* 2.6*   Recent Labs  Lab 10/23/19 0450 10/24/19 0545  LIPASE 79* 55*   No  results for input(s): AMMONIA in the last 168 hours. Coagulation Profile: No results for input(s): INR, PROTIME in the last 168 hours. Cardiac Enzymes: No results for input(s): CKTOTAL, CKMB, CKMBINDEX, TROPONINI in the last 168 hours. BNP (last 3 results) No results for input(s): PROBNP in the last 8760 hours. HbA1C: No results for input(s): HGBA1C in the last 72 hours. CBG: No results for input(s): GLUCAP in the last 168 hours. Lipid Profile: No results for input(s): CHOL, HDL, LDLCALC, TRIG, CHOLHDL, LDLDIRECT in the last 72 hours. Thyroid Function Tests: No results for input(s): TSH, T4TOTAL, FREET4, T3FREE, THYROIDAB in the last 72 hours. Anemia Panel: Recent Labs    10/25/19 0421  TIBC 172*  IRON 30   Sepsis Labs: Recent Labs  Lab 10/25/19 0421  PROCALCITON 14.74    Recent Results (from the past 240 hour(s))  Respiratory Panel by RT PCR (Flu A&B, Covid) - Nasopharyngeal Swab     Status: None   Collection Time: 10/19/19 11:13 PM   Specimen: Nasopharyngeal Swab  Result Value Ref Range Status   SARS Coronavirus 2 by RT PCR NEGATIVE NEGATIVE Final    Comment: (NOTE) SARS-CoV-2 target nucleic acids are NOT DETECTED.  The SARS-CoV-2 RNA is generally detectable in upper respiratoy specimens during the acute phase of infection. The lowest concentration of SARS-CoV-2 viral copies this assay can detect is 131 copies/mL. A negative result does not preclude SARS-Cov-2 infection and should not be used as the sole basis for treatment or other patient management decisions. A negative result may occur with  improper specimen collection/handling, submission of specimen other than nasopharyngeal swab, presence of viral mutation(s) within the areas targeted by this assay, and inadequate number of viral copies (<131 copies/mL). A negative result must be combined with clinical observations, patient history, and epidemiological information. The expected result is Negative.  Fact  Sheet for Patients:  PinkCheek.be  Fact Sheet for Healthcare Providers:  GravelBags.it  This test is no t yet approved or cleared by the [place] FDA and  has been authorized for detection and/or diagnosis of SARS-CoV-2 by FDA under an Emergency Use Authorization (EUA). This EUA will remain  in effect (meaning this test can be used) for the duration of the COVID-19 declaration under Section 564(b)(1) of the Act, 21 U.S.C. section 360bbb-3(b)(1), unless the authorization is terminated or revoked sooner.  Influenza A by PCR NEGATIVE NEGATIVE Final   Influenza B by PCR NEGATIVE NEGATIVE Final    Comment: (NOTE) The Xpert Xpress SARS-CoV-2/FLU/RSV assay is intended as an aid in  the diagnosis of influenza from Nasopharyngeal swab specimens and  should not be used as a sole basis for treatment. Nasal washings and  aspirates are unacceptable for Xpert Xpress SARS-CoV-2/FLU/RSV  testing.  Fact Sheet for Patients: PinkCheek.be  Fact Sheet for Healthcare Providers: GravelBags.it  This test is not yet approved or cleared by the [place] FDA and  has been authorized for detection and/or diagnosis of SARS-CoV-2 by  FDA under an Emergency Use Authorization (EUA). This EUA will remain  in effect (meaning this test can be used) for the duration of the  Covid-19 declaration under Section 564(b)(1) of the Act, 21  U.S.C. section 360bbb-3(b)(1), unless the authorization is  terminated or revoked. Performed at [facility], [address] [place], [place] [address]   MRSA PCR Screening     Status: None   Collection Time: 10/25/19  2:50 PM   Specimen: Nasopharyngeal  Result Value Ref Range Status   MRSA by PCR NEGATIVE NEGATIVE Final    Comment:        The GeneXpert MRSA Assay (FDA approved for NASAL specimens only), is one component of a comprehensive  MRSA colonization surveillance program. It is not intended to diagnose MRSA infection nor to guide or monitor treatment for MRSA infections. Performed at [facility], [address] [place], [place] [address]   CULTURE, BLOOD (ROUTINE X 2) w Reflex to ID Panel     Status: None (Preliminary result)   Collection Time: 10/25/19  4:29 PM   Specimen: BLOOD  Result Value Ref Range Status   Specimen Description BLOOD CENTRAL LINE  Final   Special Requests   Final    BOTTLES DRAWN AEROBIC AND ANAEROBIC Blood Culture adequate volume   Culture   Final    NO GROWTH < 24 HOURS Performed at [facility], [address], [place], [place] [address]    Report Status PENDING  Incomplete     Radiology Studies: DG Chest Port 1 View  Result Date: 10/24/2019 CLINICAL DATA:  64 year old female admitted with heart failure. EXAM: PORTABLE CHEST 1 VIEW COMPARISON:  10/19/2019 FINDINGS: Marked cardiomegaly, unchanged. Cephalization of the pulmonary vasculature. Streaky bibasilar pulmonary opacities. Blunting of left costophrenic angle. No pneumothorax. No acute osseous abnormality. IMPRESSION: Mild pulmonary edema and probable trace left pleural effusion. Stable cardiomegaly. Electronically Signed   By: Ruthann Cancer MD   On: 10/24/2019 12:00   DG ABD ACUTE 2+V W 1V CHEST  Result Date: 10/25/2019 CLINICAL DATA:  Shortness of breath EXAM: DG ABDOMEN ACUTE W/ 1V CHEST COMPARISON:  02/21/2019 FINDINGS: Shallow lung inflation with mild cardiomegaly and left basilar consolidation. Bowel gas pattern is nonobstructive. No free intraperitoneal air. IMPRESSION: Shallow lung inflation with left basilar consolidation, which may indicate atelectasis or pneumonia. Electronically Signed   By: Ulyses Jarred M.D.   On: 10/25/2019 00:21    Scheduled Meds: . Chlorhexidine Gluconate Cloth  6 each Topical Daily  . mouth rinse  15 mL Mouth Rinse BID  . pantoprazole (PROTONIX) IV  40 mg Intravenous BID   . polyethylene glycol  17 g Oral Daily  . sodium chloride flush  3 mL Intravenous Q12H   Continuous Infusions: . sodium chloride 10 mL/hr at 10/26/19 0700  . azithromycin Stopped (10/25/19 1728)  . cefTRIAXone (ROCEPHIN)  IV  Stopped (10/25/19 2221)     LOS: 7 days   Time spent: 35 minutes.  Desma Maxim, MD [facility]  If 7PM-7AM, please contact night-coverage [email or web address]  10/26/2019, 9:05 AM

## 2019-10-26 NOTE — Progress Notes (Signed)
[facility]  ROUNDING NOTE   Subjective:   [name] admitted on 10/19/2019 for Peripheral edema [R60.9] Acute CHF (congestive heart failure) ([place]) [I50.9] AKI (acute kidney injury) ([place]) [N17.9] Congestive heart failure, unspecified HF chronicity, unspecified heart failure type ([place]) [I50.9]  Patient required emergent dialysis yesterday, due to declining respiratory status and fluid overload. Patient appears more comfortable today, on 3 L of O2 via nasal cannula.  Objective:  Vital signs in last 24 hours:  Temp:  [97.4 F (36.3 C)-98.1 F (36.7 C)] 97.6 F (36.4 C) (10/18 0315) Pulse Rate:  [40-104] 97 (10/18 1000) Resp:  [19-38] 22 (10/18 1400) BP: (82-130)/(48-86) 86/72 (10/18 1400) SpO2:  [93 %-100 %] 97 % (10/18 1000) FiO2 (%):  [34 %] 34 % (10/17 1600) Weight:  [191 kg] 115 kg (10/17 1448)  Weight change:  Filed Weights   10/23/19 0423 10/23/19 2224 10/25/19 1448  Weight: 115.8 kg 115.6 kg 115 kg    Intake/Output: I/O last 3 completed shifts: In: 1431.8 [I.V.:846.1; IV Piggyback:585.7] Out: 715 [Urine:215; Other:500]   Intake/Output this shift:  Total I/O In: 20 [I.V.:20] Out: -   Physical Exam: General:  Awake, alert, in no acute distress  Head  Normocephalic, atraumatic  Neck: Supple  Lungs:   Lungs with crackles bilaterally,Normal effort  Heart:  S1S2, regular rate and rhythm  Abdomen:   Tenderness +, Abdominal swelling +  Extremities: 2+ tight peripheral edema  Neurologic:  oriented x3  Skin: No acute lesions or rashes  Rt Femoral Catheter  Basic Metabolic Panel: Recent Labs  Lab 10/23/19 0450 10/23/19 0450 10/24/19 0545 10/24/19 0545 10/25/19 0018 10/25/19 0421 10/25/19 1119 10/26/19 0645  NA 139  --  138  --  138 138  --  139  K 4.0  --  4.7  --  5.4* 5.0  --  4.7  CL 107  --  103  --  105 105  --  105  CO2 23  --  22  --  23 22  --  24  GLUCOSE 130*  --  117*  --  124* 119*  --  91  BUN 57*  --  55*  --  61* 61*   --  61*  CREATININE 2.67*  --  2.81*  --  3.27* 3.25*  --  3.25*  CALCIUM 8.6*   < > 9.4   < > 9.5 9.2  --  9.0  MG  --   --   --   --  3.0*  --   --   --   PHOS  --   --   --   --   --   --  4.2  --    < > = values in this interval not displayed.    Liver Function Tests: Recent Labs  Lab 10/23/19 0450 10/24/19 0545 10/25/19 0421 10/26/19 0645  AST 23 25 17 16   ALT 15 14 10 8   ALKPHOS 119 113 88 73  BILITOT 1.4* 2.3* 2.2* 2.2*  PROT 6.0* 7.0 6.1* 5.4*  ALBUMIN 2.5*  2.5* 3.2* 3.0* 2.6*   Recent Labs  Lab 10/23/19 0450 10/24/19 0545  LIPASE 79* 55*   No results for input(s): AMMONIA in the last 168 hours.  CBC: Recent Labs  Lab 10/23/19 0450 10/25/19 0018 10/25/19 0421 10/26/19 0645  WBC 4.4 10.5 10.5 10.7*  HGB 11.7* 13.4 13.2 11.4*  HCT 36.3 42.3 41.6 34.8*  MCV 74.4* 75.9* 74.4* 72.7*  PLT 302  307 302 260    Cardiac Enzymes: No results for input(s): CKTOTAL, CKMB, CKMBINDEX, TROPONINI in the last 168 hours.  BNP: Invalid input(s): POCBNP  CBG: No results for input(s): GLUCAP in the last 168 hours.  Microbiology: Results for orders placed or performed during the hospital encounter of 10/19/19  Respiratory Panel by RT PCR (Flu A&B, Covid) - Nasopharyngeal Swab     Status: None   Collection Time: 10/19/19 11:13 PM   Specimen: Nasopharyngeal Swab  Result Value Ref Range Status   SARS Coronavirus 2 by RT PCR NEGATIVE NEGATIVE Final    Comment: (NOTE) SARS-CoV-2 target nucleic acids are NOT DETECTED.  The SARS-CoV-2 RNA is generally detectable in upper respiratoy specimens during the acute phase of infection. The lowest concentration of SARS-CoV-2 viral copies this assay can detect is 131 copies/mL. A negative result does not preclude SARS-Cov-2 infection and should not be used as the sole basis for treatment or other patient management decisions. A negative result may occur with  improper specimen collection/handling, submission of specimen  other than nasopharyngeal swab, presence of viral mutation(s) within the areas targeted by this assay, and inadequate number of viral copies (<131 copies/mL). A negative result must be combined with clinical observations, patient history, and epidemiological information. The expected result is Negative.  Fact Sheet for Patients:  PinkCheek.be  Fact Sheet for Healthcare Providers:  GravelBags.it  This test is no t yet approved or cleared by the [place] FDA and  has been authorized for detection and/or diagnosis of SARS-CoV-2 by FDA under an Emergency Use Authorization (EUA). This EUA will remain  in effect (meaning this test can be used) for the duration of the COVID-19 declaration under Section 564(b)(1) of the Act, 21 U.S.C. section 360bbb-3(b)(1), unless the authorization is terminated or revoked sooner.     Influenza A by PCR NEGATIVE NEGATIVE Final   Influenza B by PCR NEGATIVE NEGATIVE Final    Comment: (NOTE) The Xpert Xpress SARS-CoV-2/FLU/RSV assay is intended as an aid in  the diagnosis of influenza from Nasopharyngeal swab specimens and  should not be used as a sole basis for treatment. Nasal washings and  aspirates are unacceptable for Xpert Xpress SARS-CoV-2/FLU/RSV  testing.  Fact Sheet for Patients: PinkCheek.be  Fact Sheet for Healthcare Providers: GravelBags.it  This test is not yet approved or cleared by the [place] FDA and  has been authorized for detection and/or diagnosis of SARS-CoV-2 by  FDA under an Emergency Use Authorization (EUA). This EUA will remain  in effect (meaning this test can be used) for the duration of the  Covid-19 declaration under Section 564(b)(1) of the Act, 21  U.S.C. section 360bbb-3(b)(1), unless the authorization is  terminated or revoked. Performed at [facility], [address]  [place], [place] [address]   MRSA PCR Screening     Status: None   Collection Time: 10/25/19  2:50 PM   Specimen: Nasopharyngeal  Result Value Ref Range Status   MRSA by PCR NEGATIVE NEGATIVE Final    Comment:        The GeneXpert MRSA Assay (FDA approved for NASAL specimens only), is one component of a comprehensive MRSA colonization surveillance program. It is not intended to diagnose MRSA infection nor to guide or monitor treatment for MRSA infections. Performed at [facility], [place], [place] [address]   CULTURE, BLOOD (ROUTINE X 2) w Reflex to ID Panel     Status: None (Preliminary result)   Collection Time:  10/25/19  4:29 PM   Specimen: BLOOD  Result Value Ref Range Status   Specimen Description BLOOD CENTRAL LINE  Final   Special Requests   Final    BOTTLES DRAWN AEROBIC AND ANAEROBIC Blood Culture adequate volume   Culture   Final    NO GROWTH < 24 HOURS Performed at [facility], [address] [place], [place] [address]    Report Status PENDING  Incomplete    Coagulation Studies: No results for input(s): LABPROT, INR in the last 72 hours.  Urinalysis: No results for input(s): COLORURINE, LABSPEC, PHURINE, GLUCOSEU, HGBUR, BILIRUBINUR, KETONESUR, PROTEINUR, UROBILINOGEN, NITRITE, LEUKOCYTESUR in the last 72 hours.  Invalid input(s): APPERANCEUR    Imaging: US Paracentesis  Result Date: 10/26/2019 INDICATION: Ascites, distension EXAM: ULTRASOUND GUIDED DIAGNOSTIC AND THERAPEUTIC PARACENTESIS MEDICATIONS: 1% LIDOCAINE LOCAL COMPLICATIONS: None immediate. PROCEDURE: Informed written consent was obtained from the patient's sister by phone and verified by ultrasound staff after a discussion of the risks, benefits and alternatives to treatment. A timeout was performed prior to the initiation of the procedure. Initial ultrasound scanning demonstrates a large amount of ascites within the left lower abdominal quadrant. The left lower  abdomen was prepped and draped in the usual sterile fashion. 1% lidocaine was used for local anesthesia. Following this, a 6 Fr Safe-T-Centesis catheter was introduced. An ultrasound image was saved for documentation purposes. The paracentesis was performed. The catheter was removed and a dressing was applied. The patient tolerated the procedure well without immediate post procedural complication. FINDINGS: A total of approximately 6.4 L of yellow peritoneal fluid was removed. Samples were sent to the laboratory as requested by the clinical team. IMPRESSION: Successful ultrasound-guided paracentesis yielding 6.4 liters of peritoneal fluid. Electronically Signed   By: Jerilynn Mages.  Shick M.D.   On: 10/26/2019 12:36   DG ABD ACUTE 2+V W 1V CHEST  Result Date: 10/25/2019 CLINICAL DATA:  Shortness of breath EXAM: DG ABDOMEN ACUTE W/ 1V CHEST COMPARISON:  02/21/2019 FINDINGS: Shallow lung inflation with mild cardiomegaly and left basilar consolidation. Bowel gas pattern is nonobstructive. No free intraperitoneal air. IMPRESSION: Shallow lung inflation with left basilar consolidation, which may indicate atelectasis or pneumonia. Electronically Signed   By: Ulyses Jarred M.D.   On: 10/25/2019 00:21     Medications:   . sodium chloride 10 mL/hr at 10/26/19 0700  . albumin human    . azithromycin 500 mg (10/26/19 1410)  . cefTRIAXone (ROCEPHIN)  IV    . metronidazole 500 mg (10/26/19 1028)   . Chlorhexidine Gluconate Cloth  6 each Topical Daily  . mouth rinse  15 mL Mouth Rinse BID  . pantoprazole (PROTONIX) IV  40 mg Intravenous BID  . polyethylene glycol  17 g Oral Daily  . sodium chloride flush  3 mL Intravenous Q12H   sodium chloride, acetaminophen, alum & mag hydroxide-simeth, alum & mag hydroxide-simeth **AND** lidocaine, bisacodyl, Glycerin (Adult), midodrine, morphine injection, ondansetron (ZOFRAN) IV, sodium chloride flush  Assessment/ Plan:  [name] is a 64 y.o. black female with  congestive heart failure, hypertension, hyperlipidemia, gout, COPD, anemia, cocaine abuse who is admitted to [facility] on 10/19/2019 for Peripheral edema [R60.9] Acute CHF (congestive heart failure) ([place]) [I50.9] AKI (acute kidney injury) ([place]) [N17.9] Congestive heart failure, unspecified HF chronicity, unspecified heart failure type ([place]) [I50.9]  1. Acute kidney injury on chronic kidney disease stage IIIB. Baseline creatinine of 1.66, GFR of 32. With history of proteinuria.  Chronic kidney disease secondary to hypertension  Acute renal  failure secondary to acute cardiorenal syndrome  -Patient received her first dialysis treatment yesterday, Rt Femoral catheter placed by Vascular team yesterday -She appears stable today, still fluid overloaded -We will plan for ultrafiltration only session today -Orders placed -Creatinine 3.25 -BUN 61 - Entresto on hold - stays Oliguric   2. Hypotension with acute exacerbation of systolic and diastolic congestive heart failure. Echocardiogram 06/22/19 EF of 25-30%.  -on 3 L nasal cannula -Blood pressure readings soft,100/78 -Patient is on Midodrine,started today - IV Albumin with dialysis - Cardiology team following      LOS: 7 Hinley Brimage 10/18/20212:34 PM

## 2019-10-26 NOTE — Progress Notes (Signed)
1800 Back from dialysis.

## 2019-10-26 NOTE — Progress Notes (Signed)
Patient Name: Katelyn Lane Date of Encounter: 10/26/2019  Hospital Problem List     Principal Problem:   Acute CHF (congestive heart failure) ([place]) Active Problems:   AKI (acute kidney injury) ([place])   Crack cocaine use   Acute respiratory failure with hypoxia (HCC)   LV (left ventricular) mural thrombus   Cocaine abuse ([place])   Peripheral edema   Cirrhosis of liver [facility])    Patient Profile     64 year old female with PMH significant for chronic combined systolic HF (DP=82-42% Echo 06/2019), h/o CVA, ventricular thrombus (on Xarelto), HTN, HLD, CKD Stage IIIb and cocaine use who was admitted for CHF exacerbation .  Subjective   Transferred to CCU for line placement for hemodialysis.  Not tolerating BiPAP.  Much better this morning.  Less short of breath.  Inpatient Medications    . Chlorhexidine Gluconate Cloth  6 each Topical Daily  . mouth rinse  15 mL Mouth Rinse BID  . pantoprazole (PROTONIX) IV  40 mg Intravenous BID  . polyethylene glycol  17 g Oral Daily  . sodium chloride flush  3 mL Intravenous Q12H    Vital Signs    Vitals:   10/26/19 0315 10/26/19 0400 10/26/19 0630 10/26/19 0700  BP: 105/74 (!) 91/58  (!) 86/66  Pulse: (!) 104 93 90 98  Resp: (!) 25 (!) 25 (!) 25 19  Temp: 97.6 F (36.4 C)     TempSrc: Oral     SpO2: 98% 93% 95% 96%  Weight:      Height:        Intake/Output Summary (Last 24 hours) at 10/26/2019 0805 Last data filed at 10/26/2019 0700 Gross per 24 hour  Intake 1000.86 ml  Output 670 ml  Net 330.86 ml   Filed Weights   10/23/19 0423 10/23/19 2224 10/25/19 1448  Weight: 115.8 kg 115.6 kg 115 kg    Physical Exam    GEN: Well nourished, well developed, in no acute distress.  HEENT: normal.  Neck: Supple, no JVD, carotid bruits, or masses. Cardiac: RRR, no murmurs, rubs, or gallops. No clubbing, cyanosis, edema.  Radials/DP/PT 2+ and equal bilaterally.  Respiratory:  Respirations regular and unlabored, Bilateral  rhonchi GI: Soft, nontender, nondistended, BS + x 4. MS: no deformity or atrophy. Skin: warm and dry, no rash. Neuro:  Strength and sensation are intact. Psych: Normal affect.  Labs    CBC Recent Labs    10/25/19 0421 10/26/19 0645  WBC 10.5 10.7*  HGB 13.2 11.4*  HCT 41.6 34.8*  MCV 74.4* 72.7*  PLT 302 353   Basic Metabolic Panel Recent Labs    10/25/19 0018 10/25/19 0018 10/25/19 0421 10/25/19 1119 10/26/19 0645  NA 138   < > 138  --  139  K 5.4*   < > 5.0  --  4.7  CL 105   < > 105  --  105  CO2 23   < > 22  --  24  GLUCOSE 124*   < > 119*  --  91  BUN 61*   < > 61*  --  61*  CREATININE 3.27*   < > 3.25*  --  3.25*  CALCIUM 9.5   < > 9.2  --  9.0  MG 3.0*  --   --   --   --   PHOS  --   --   --  4.2  --    < > = values in this interval not displayed.  Liver Function Tests Recent Labs    10/25/19 0421 10/26/19 0645  AST 17 16  ALT 10 8  ALKPHOS 88 73  BILITOT 2.2* 2.2*  PROT 6.1* 5.4*  ALBUMIN 3.0* 2.6*   Recent Labs    10/24/19 0545  LIPASE 55*   Cardiac Enzymes No results for input(s): CKTOTAL, CKMB, CKMBINDEX, TROPONINI in the last 72 hours. BNP No results for input(s): BNP in the last 72 hours. D-Dimer No results for input(s): DDIMER in the last 72 hours. Hemoglobin A1C No results for input(s): HGBA1C in the last 72 hours. Fasting Lipid Panel No results for input(s): CHOL, HDL, LDLCALC, TRIG, CHOLHDL, LDLDIRECT in the last 72 hours. Thyroid Function Tests No results for input(s): TSH, T4TOTAL, T3FREE, THYROIDAB in the last 72 hours.  Invalid input(s): FREET3  Telemetry    Normal sinus rhythm  ECG    Normal sinus rhythm with no ischemia  Radiology    DG Chest 2 View  Result Date: 10/19/2019 CLINICAL DATA:  Shortness of breath. EXAM: CHEST - 2 VIEW COMPARISON:  08/27/2019 and chest CT 08/15/2019 FINDINGS: Stable cardiomegaly. No overt pulmonary edema. Difficult to exclude densities at the left lung base and small left pleural  effusion. Negative for a pneumothorax. IMPRESSION: Stable cardiomegaly with probable left basilar chest densities. Difficult to exclude a small left pleural effusion. Minimal change from the exam on 08/27/2019. Electronically Signed   By: Markus Daft M.D.   On: 10/19/2019 09:42   US Abdomen Complete  Result Date: 10/24/2019 CLINICAL DATA:  Abdominal pain. Mildly elevated bilirubin and lipase 4 months. EXAM: ABDOMEN ULTRASOUND COMPLETE COMPARISON:  None. FINDINGS: Gallbladder: The gallbladder wall is thickened measuring up to 5.5 mm. No stones, sludge, or Murphy's sign identified. Common bile duct: Diameter: 2.9 mm Liver: Diffuse coarsened echotexture with a nodular contour. No focal mass. Portal vein is patent on color Doppler imaging with normal direction of blood flow towards the liver. IVC: No abnormality visualized. Pancreas: Limited visualization due to bowel gas shadowing. Limited views of the pancreas are normal. Spleen: Size and appearance within normal limits. Right Kidney: Length: 9.7 cm. Contains 2 probable small cysts measuring 1.7 and 1.4 cm. Left Kidney: Length: 10.3 cm. Echogenicity within normal limits. No mass or hydronephrosis visualized. Abdominal aorta: 3.1 cm proximally Other findings: Ascites is seen in all 4 quadrants. Evaluation is limited due to labored breathing. IMPRESSION: 1. The gallbladder wall is thickened which is nonspecific given apparent signs of cirrhosis and obvious ascites. The gallbladder is otherwise unremarkable. If there is concern for acute cholecystitis, recommend HIDA scan. 2. 4 quadrant ascites, moderate. 3. Coarsened echotexture to the liver with a nodular contour suggesting cirrhosis. 4. Mild prominence of the proximal abdominal aorta measuring 3.1 cm. 5. Small cyst in the right kidney. Electronically Signed   By: Dorise Bullion III M.D   On: 10/24/2019 10:28   US RENAL  Result Date: 10/20/2019 CLINICAL DATA:  64 year old female with acute renal failure.  EXAM: RENAL / URINARY TRACT ULTRASOUND COMPLETE COMPARISON:  CT abdomen pelvis dated 08/15/2019. FINDINGS: Right Kidney: Renal measurements: 10.9 x 4.6 x 4.9 cm = volume: 127 mL. There is mild diffuse increased echogenicity. No hydronephrosis or shadowing stone. There is a 1.7 x 1.1 x 1.1 cm lateral interpolar cyst. Left Kidney: Renal measurements: 10.1 x 4.8 x 5.3 cm = volume: 132 mL. Mild increased echogenicity. No hydronephrosis or shadowing stone. Bladder: The bladder is partially visualized. Other: There is a small ascites and anasarca. IMPRESSION: 1. Increased  renal parenchymal echogenicity in keeping with chronic kidney disease. No hydronephrosis or shadowing stone. 2. Small ascites. Electronically Signed   By: Anner Crete M.D.   On: 10/20/2019 17:59   DG Chest Port 1 View  Result Date: 10/24/2019 CLINICAL DATA:  64 year old female admitted with heart failure. EXAM: PORTABLE CHEST 1 VIEW COMPARISON:  10/19/2019 FINDINGS: Marked cardiomegaly, unchanged. Cephalization of the pulmonary vasculature. Streaky bibasilar pulmonary opacities. Blunting of left costophrenic angle. No pneumothorax. No acute osseous abnormality. IMPRESSION: Mild pulmonary edema and probable trace left pleural effusion. Stable cardiomegaly. Electronically Signed   By: Ruthann Cancer MD   On: 10/24/2019 12:00   DG ABD ACUTE 2+V W 1V CHEST  Result Date: 10/25/2019 CLINICAL DATA:  Shortness of breath EXAM: DG ABDOMEN ACUTE W/ 1V CHEST COMPARISON:  02/21/2019 FINDINGS: Shallow lung inflation with mild cardiomegaly and left basilar consolidation. Bowel gas pattern is nonobstructive. No free intraperitoneal air. IMPRESSION: Shallow lung inflation with left basilar consolidation, which may indicate atelectasis or pneumonia. Electronically Signed   By: Ulyses Jarred M.D.   On: 10/25/2019 00:21    Assessment & Plan    Principal Problem: Acute CHF (congestive heart failure) (HCC) Active Problems: AKI (acute kidney injury)  ([place]) Crack cocaine use Acute respiratory failure with hypoxia (HCC) LV (left ventricular) mural thrombus Cocaine abuse ([place]) Peripheral edema   ASSESSMENT AND PLAN: 1. Acute on Chronic CHF exacerbation (EF =25-30% on Echo), uncompensated, fairly stable at this time -BNP >4500 -High Sensitivity troponin=106>>156; likely due to demand ischemia.  -Lasix drip started. Will follow. -Hold Entresto at this time due to hypotension and acute on chronic CKD stage IIIb. Consider restarting as outpatient.  -appreciate nephrology input.   -1500cc FS. -Strict I&O's -Daily weights - Recommend continuous telemetry monitoring but patient refuses  -low sodium, heart healthy diet  2. Acute hypoxic respiratory failure, stable  -Continue HF management as stated above. -Agree with supplemental oxygen therapy. Wean as tolerated. -RT consult appreciated.  3. Acute on Chronic CKD Stage IIIb, fairly stable -Nephrology input appreciated.Renal function unchanged from previously -We willcontinue tohold Entresto at this time due to hypotension and renal function.  -  4. H/o Ventricular thrombus, stable -patient on xarelto; continue.  -follow bleeding precautions.   5. Hypotension-persists  -monitor bp q4h.  -hold antihypertensives at this time.  6. HLD, stable -lipid profile from 05/22/19 is within reference range with a total cholesterol=100, HDL=46, LDL=43 and Triglycerides=56 -continue diet management.   5. Cocaine use -psychiatric evaluation appreciated. -Recommend cocaine cessation. -Recommend social work consult for sociological issues  (homelessness).   Signed, Javier Docker Sahana Boyland MD 10/26/2019, 8:05 AM  Pager: [phone]

## 2019-10-27 DIAGNOSIS — R109 Unspecified abdominal pain: Secondary | ICD-10-CM | POA: Diagnosis not present

## 2019-10-27 DIAGNOSIS — I5041 Acute combined systolic (congestive) and diastolic (congestive) heart failure: Secondary | ICD-10-CM | POA: Diagnosis not present

## 2019-10-27 LAB — COMPREHENSIVE METABOLIC PANEL
ALT: 8 U/L (ref 0–44)
AST: 14 U/L — ABNORMAL LOW (ref 15–41)
Albumin: 2.4 g/dL — ABNORMAL LOW (ref 3.5–5.0)
Alkaline Phosphatase: 68 U/L (ref 38–126)
Anion gap: 9 (ref 5–15)
BUN: 66 mg/dL — ABNORMAL HIGH (ref 8–23)
CO2: 24 mmol/L (ref 22–32)
Calcium: 9 mg/dL (ref 8.9–10.3)
Chloride: 103 mmol/L (ref 98–111)
Creatinine, Ser: 3.67 mg/dL — ABNORMAL HIGH (ref 0.44–1.00)
GFR, Estimated: 12 mL/min — ABNORMAL LOW (ref >60)
Glucose, Bld: 111 mg/dL — ABNORMAL HIGH (ref 70–99)
Potassium: 4.5 mmol/L (ref 3.5–5.1)
Sodium: 136 mmol/L (ref 135–145)
Total Bilirubin: 1.7 mg/dL — ABNORMAL HIGH (ref 0.3–1.2)
Total Protein: 5 g/dL — ABNORMAL LOW (ref 6.5–8.1)

## 2019-10-27 LAB — LACTIC ACID, PLASMA: Lactic Acid, Venous: 1 mmol/L (ref 0.5–1.9)

## 2019-10-27 LAB — CBC
HCT: 31.4 % — ABNORMAL LOW (ref 36.0–46.0)
Hemoglobin: 10.3 g/dL — ABNORMAL LOW (ref 12.0–15.0)
MCH: 24 pg — ABNORMAL LOW (ref 26.0–34.0)
MCHC: 32.8 g/dL (ref 30.0–36.0)
MCV: 73 fL — ABNORMAL LOW (ref 80.0–100.0)
Platelets: 245 10*3/uL (ref 150–400)
RBC: 4.3 MIL/uL (ref 3.87–5.11)
RDW: 22.4 % — ABNORMAL HIGH (ref 11.5–15.5)
WBC: 10.3 10*3/uL (ref 4.0–10.5)
nRBC: 0 % (ref 0.0–0.2)

## 2019-10-27 LAB — PROTIME-INR
INR: 2 — ABNORMAL HIGH (ref 0.8–1.2)
Prothrombin Time: 22 seconds — ABNORMAL HIGH (ref 11.4–15.2)

## 2019-10-27 MED ORDER — ALBUMIN HUMAN 25 % IV SOLN
25.0000 g | Freq: Once | INTRAVENOUS | Status: AC
  Administered 2019-10-28: 25 g via INTRAVENOUS
  Filled 2019-10-27: qty 100

## 2019-10-27 MED ORDER — FUROSEMIDE 10 MG/ML IJ SOLN
60.0000 mg | Freq: Two times a day (BID) | INTRAMUSCULAR | Status: DC
  Administered 2019-10-27 – 2019-11-09 (×25): 60 mg via INTRAVENOUS
  Filled 2019-10-27: qty 8
  Filled 2019-10-27 (×3): qty 6
  Filled 2019-10-27 (×2): qty 8
  Filled 2019-10-27 (×2): qty 6
  Filled 2019-10-27: qty 8
  Filled 2019-10-27 (×3): qty 6
  Filled 2019-10-27 (×3): qty 8
  Filled 2019-10-27 (×4): qty 6
  Filled 2019-10-27 (×2): qty 8
  Filled 2019-10-27: qty 6
  Filled 2019-10-27: qty 8
  Filled 2019-10-27: qty 6
  Filled 2019-10-27: qty 8
  Filled 2019-10-27: qty 6

## 2019-10-27 NOTE — Progress Notes (Signed)
Mobility Specialist - Progress Note   10/27/19 1500  Mobility  Activity Transferred to/from BSC  Level of Assistance Moderate assist, patient does 50-74%  Assistive Device None  Distance Ambulated (ft) 2 ft  Mobility Response Tolerated fair  Mobility performed by Mobility specialist  $Mobility charge 1 Mobility    Pt was sitting on BSC upon arrival. Pt seems agitated at time of arrival. Mobility attempted to bring pt to calming state, tactics worked temporarily. NT entered and was able to assist in hygiene tasks. Pt was modA in STS from [facility] with only 2 hand assist. Pt was able to pivot to bed. Noted heavy breathing, difficulty reading vitals d/t poor pleth. Further mobility was limited d/t pt's irritability and request to take a nap. Overall, pt tolerated fairly. Pt was left in bed with all needs in reach and alarm set.    Kathee Delton Mobility Specialist 10/27/19, 3:16 PM

## 2019-10-27 NOTE — Progress Notes (Signed)
Pt refused 1600 vital signs. Will continue to monitor.

## 2019-10-27 NOTE — Progress Notes (Addendum)
  Speech Language Pathology Treatment: Dysphagia  Patient Details Name: Katelyn Lane MRN: [id number] DOB: 1955-01-25 Today's Date: 10/27/2019 Time: 0145-0215 SLP Time Calculation (min) (ACUTE ONLY): 30 min  Assessment / Plan / Recommendation Clinical Impression  Pt was seen for f/u toleration of age-appropriate regular diet. No overt clinical s/s of aspiration noted w/ brief oral intake-- Pt observed w/ PO trials of thin liquids 6-7x, self-fed via straw. Pt required full assist for upright positioning in bed. Max verbal cues required to cue pt to orient to task, follow directions, and cooperate w/ clinicians. Upon entering room, SLP noted pt had been coughing as well as lying back in bed w/ lunch tray in front of her. Attempted to educate on aspiration precautions, including sitting up for all oral intake- pt not fully agreeable to education/discussion.   Pt continues to demonstrate emotional lability/irritability, which limited this session. Pt declined PO's on lunch tray, but kept her milk & cookies.   Pt appears at reduced risk of aspiration following general aspiration precautions. Recommend Mech Soft (Chopped Meats/moistened foods) w/ thin liquids for ease of eating/self-feeding. MD/NSG updated-- education left in room. ST services to re-consult as needed if new dysphagia needs arise.        HPI: Pt  is a 64 y.o. 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      SLP Plan  All goals met;Discharge SLP treatment due to (comment) (d/c d/t goals met)       Recommendations  Diet recommendations: Dysphagia 3 (mechanical soft);Thin liquid Liquids provided via: Cup;Straw Medication Administration: Whole meds with puree Supervision: Patient able to  self feed;Intermittent supervision to cue for compensatory strategies Compensations: Minimize environmental distractions;Slow rate;Small sips/bites;Follow solids with liquid Postural Changes and/or Swallow Maneuvers: Seated upright 90 degrees;Upright 30-60 min after meal                Oral Care Recommendations: Oral care BID;Patient independent with oral care Follow up Recommendations: None SLP Visit Diagnosis: Dysphagia, unspecified (R13.10) Plan: All goals met;Discharge SLP treatment due to (comment) (d/c d/t goals met)       Katelyn Lane  Graduate Clinician 10/27/2019, 2:20 PM   The information in this patient note, response to treatment, and overall treatment plan developed has been reviewed and agreed upon by this clinician.  Katelyn Kenner, MS, [place] Speech Language Pathologist Rehab Services [phone]

## 2019-10-27 NOTE — Progress Notes (Signed)
[facility]  ROUNDING NOTE   Subjective:   [name] admitted on 10/19/2019 for Peripheral edema [R60.9] Acute CHF (congestive heart failure) ([place]) [I50.9] AKI (acute kidney injury) ([place]) [N17.9] Congestive heart failure, unspecified HF chronicity, unspecified heart failure type ([place]) [I50.9]  Patient received UF only dialysis session yesterday with 1.5 L fluid removal. She appears clinically stable,  in  no acute distress.Patient  reports feeling better and breathing better.F/C draining clear yellow urine output. We will hold off on dialysis today.IV furosemide 60 mg BID ordered.   Objective:  Vital signs in last 24 hours:  Temp:  [97.2 F (36.2 C)-98.6 F (37 C)] 98.6 F (37 C) (10/19 0826) Pulse Rate:  [76-95] 90 (10/19 0826) Resp:  [16-25] 16 (10/19 0826) BP: (84-103)/(58-78) 103/72 (10/19 0826) SpO2:  [74 %-100 %] 98 % (10/19 0826) Weight:  [108.2 kg-108.4 kg] 108.4 kg (10/19 0519)  Weight change: -6.8 kg Filed Weights   10/25/19 1448 10/26/19 2310 10/27/19 0519  Weight: 115 kg 108.2 kg 108.4 kg    Intake/Output: I/O last 3 completed shifts: In: 1786.6 [P.O.:980; I.V.:215.4; IV Piggyback:591.2] Out: 2710 [Urine:710; Other:2000]   Intake/Output this shift:  Total I/O In: 3 [I.V.:3] Out: -   Physical Exam: General:  Sitting up in bed, in no acute distress  Head  Normocephalic, atraumatic,[place] in place  Neck: Supple  Lungs:   Normal effort,O2 2L via nasal canula, Lungs with fine crackles   Heart:  S1S2, regular rate and rhythm  Abdomen:   Non tender, Lower Abdominal swelling +  Extremities: 2+ pitting edema on bilateral lower extremities  Neurologic:  Alert,awake,speech clear and appropriate  Skin: No acute lesions or rashes  Rt Femoral Catheter  Basic Metabolic Panel: Recent Labs  Lab 10/24/19 0545 10/24/19 0545 10/25/19 0018 10/25/19 0018 10/25/19 0421 10/25/19 1119 10/26/19 0645 10/27/19 0230  NA 138  --  138  --  138  --  139  136  K 4.7  --  5.4*  --  5.0  --  4.7 4.5  CL 103  --  105  --  105  --  105 103  CO2 22  --  23  --  22  --  24 24  GLUCOSE 117*  --  124*  --  119*  --  91 111*  BUN 55*  --  61*  --  61*  --  61* 66*  CREATININE 2.81*  --  3.27*  --  3.25*  --  3.25* 3.67*  CALCIUM 9.4   < > 9.5   < > 9.2  --  9.0 9.0  MG  --   --  3.0*  --   --   --   --   --   PHOS  --   --   --   --   --  4.2  --   --    < > = values in this interval not displayed.    Liver Function Tests: Recent Labs  Lab 10/23/19 0450 10/24/19 0545 10/25/19 0421 10/26/19 0645 10/27/19 0230  AST 23 25 17 16  14*  ALT 15 14 10 8 8   ALKPHOS 119 113 88 73 68  BILITOT 1.4* 2.3* 2.2* 2.2* 1.7*  PROT 6.0* 7.0 6.1* 5.4* 5.0*  ALBUMIN 2.5*  2.5* 3.2* 3.0* 2.6* 2.4*   Recent Labs  Lab 10/23/19 0450 10/24/19 0545  LIPASE 79* 55*   No results for input(s): AMMONIA in the last 168 hours.  CBC:  Recent Labs  Lab 10/23/19 0450 10/25/19 0018 10/25/19 0421 10/26/19 0645 10/27/19 0230  WBC 4.4 10.5 10.5 10.7* 10.3  HGB 11.7* 13.4 13.2 11.4* 10.3*  HCT 36.3 42.3 41.6 34.8* 31.4*  MCV 74.4* 75.9* 74.4* 72.7* 73.0*  PLT 302 307 302 260 245    Cardiac Enzymes: No results for input(s): CKTOTAL, CKMB, CKMBINDEX, TROPONINI in the last 168 hours.  BNP: Invalid input(s): POCBNP  CBG: No results for input(s): GLUCAP in the last 168 hours.  Microbiology: Results for orders placed or performed during the hospital encounter of 10/19/19  Respiratory Panel by RT PCR (Flu A&B, Covid) - Nasopharyngeal Swab     Status: None   Collection Time: 10/19/19 11:13 PM   Specimen: Nasopharyngeal Swab  Result Value Ref Range Status   SARS Coronavirus 2 by RT PCR NEGATIVE NEGATIVE Final    Comment: (NOTE) SARS-CoV-2 target nucleic acids are NOT DETECTED.  The SARS-CoV-2 RNA is generally detectable in upper respiratoy specimens during the acute phase of infection. The lowest concentration of SARS-CoV-2 viral copies this assay can  detect is 131 copies/mL. A negative result does not preclude SARS-Cov-2 infection and should not be used as the sole basis for treatment or other patient management decisions. A negative result may occur with  improper specimen collection/handling, submission of specimen other than nasopharyngeal swab, presence of viral mutation(s) within the areas targeted by this assay, and inadequate number of viral copies (<131 copies/mL). A negative result must be combined with clinical observations, patient history, and epidemiological information. The expected result is Negative.  Fact Sheet for Patients:  PinkCheek.be  Fact Sheet for Healthcare Providers:  GravelBags.it  This test is no t yet approved or cleared by the [place] FDA and  has been authorized for detection and/or diagnosis of SARS-CoV-2 by FDA under an Emergency Use Authorization (EUA). This EUA will remain  in effect (meaning this test can be used) for the duration of the COVID-19 declaration under Section 564(b)(1) of the Act, 21 U.S.C. section 360bbb-3(b)(1), unless the authorization is terminated or revoked sooner.     Influenza A by PCR NEGATIVE NEGATIVE Final   Influenza B by PCR NEGATIVE NEGATIVE Final    Comment: (NOTE) The Xpert Xpress SARS-CoV-2/FLU/RSV assay is intended as an aid in  the diagnosis of influenza from Nasopharyngeal swab specimens and  should not be used as a sole basis for treatment. Nasal washings and  aspirates are unacceptable for Xpert Xpress SARS-CoV-2/FLU/RSV  testing.  Fact Sheet for Patients: PinkCheek.be  Fact Sheet for Healthcare Providers: GravelBags.it  This test is not yet approved or cleared by the [place] FDA and  has been authorized for detection and/or diagnosis of SARS-CoV-2 by  FDA under an Emergency Use Authorization (EUA). This EUA will remain  in  effect (meaning this test can be used) for the duration of the  Covid-19 declaration under Section 564(b)(1) of the Act, 21  U.S.C. section 360bbb-3(b)(1), unless the authorization is  terminated or revoked. Performed at [facility], [address] [place], [place] [address]   MRSA PCR Screening     Status: None   Collection Time: 10/25/19  2:50 PM   Specimen: Nasopharyngeal  Result Value Ref Range Status   MRSA by PCR NEGATIVE NEGATIVE Final    Comment:        The GeneXpert MRSA Assay (FDA approved for NASAL specimens only), is one component of a comprehensive MRSA colonization surveillance program. It is not intended to diagnose MRSA  infection nor to guide or monitor treatment for MRSA infections. Performed at [facility], [address] [place], [place] [address]   CULTURE, BLOOD (ROUTINE X 2) w Reflex to ID Panel     Status: None (Preliminary result)   Collection Time: 10/25/19  4:29 PM   Specimen: BLOOD  Result Value Ref Range Status   Specimen Description BLOOD CENTRAL LINE  Final   Special Requests   Final    BOTTLES DRAWN AEROBIC AND ANAEROBIC Blood Culture adequate volume   Culture   Final    NO GROWTH 2 DAYS Performed at [facility], [address] [place], [place] [address]    Report Status PENDING  Incomplete  CULTURE, BLOOD (ROUTINE X 2) w Reflex to ID Panel     Status: None (Preliminary result)   Collection Time: 10/26/19  6:45 AM   Specimen: BLOOD  Result Value Ref Range Status   Specimen Description BLOOD BLOOD LEFT HAND  Final   Special Requests   Final    BOTTLES DRAWN AEROBIC AND ANAEROBIC Blood Culture adequate volume   Culture   Final    NO GROWTH 1 DAY Performed at [facility], [address], [place], [place] [address]    Report Status PENDING  Incomplete  Body fluid culture     Status: None (Preliminary result)   Collection Time: 10/26/19 11:30 AM   Specimen: Fluid  Result Value Ref Range Status    Specimen Description   Final    FLUID Performed at [facility], [address] [place], [place] [address]    Special Requests   Final    PERITONEAL Performed at [facility], [address] [place], [place] [address]    Gram Stain   Final    ABUNDANT WBC PRESENT, PREDOMINANTLY PMN NO ORGANISMS SEEN    Culture   Final    NO GROWTH < 12 HOURS Performed at [facility], [place] [address], [place], [place] [address]    Report Status PENDING  Incomplete    Coagulation Studies: Recent Labs    10/27/19 0230  LABPROT 22.0*  INR 2.0*    Urinalysis: No results for input(s): COLORURINE, LABSPEC, PHURINE, GLUCOSEU, HGBUR, BILIRUBINUR, KETONESUR, PROTEINUR, UROBILINOGEN, NITRITE, LEUKOCYTESUR in the last 72 hours.  Invalid input(s): APPERANCEUR    Imaging: CT ABDOMEN PELVIS WO CONTRAST  Result Date: 10/27/2019 CLINICAL DATA:  64 year old female with abdominal pain. Concern for peritonitis. EXAM: CT ABDOMEN AND PELVIS WITHOUT CONTRAST TECHNIQUE: Multidetector CT imaging of the abdomen and pelvis was performed following the standard protocol without IV contrast. COMPARISON:  CT abdomen pelvis dated 08/15/2019. FINDINGS: Evaluation of this exam is limited in the absence of intravenous contrast. Lower chest: Partially visualized small bilateral pleural effusions. There is partial consolidative changes of the right lower lobe and majority of the visualized left lower lobe which may represent atelectasis or pneumonia. Clinical correlation is recommended. There is moderate cardiomegaly. Coronary vascular calcifications noted. There is hypoattenuation of the cardiac blood pool suggestive of anemia. Clinical correlation is recommended. Moderate ascites, significantly increased since the prior CT. Hepatobiliary: Slight irregularity of the liver contour may represent early changes of cirrhosis. Clinical correlation is recommended. No intrahepatic biliary ductal dilatation.  The gallbladder is predominantly contracted. Pancreas: The pancreas is grossly unremarkable. Spleen: Normal in size without focal abnormality. Adrenals/Urinary Tract: The adrenal glands are unremarkable. There is no hydronephrosis or nephrolithiasis on either side. The urinary bladder is decompressed around a Foley catheter.  Stomach/Bowel: There is air within the wall of the gastric fundus consistent with pneumatosis and most concerning for bowel ischemia. Two small pockets of air in the periportal region and along the falciform ligament (35/2) most concerning for extraluminal air, possibly intravascular. Correlation with clinical exam and lactic acid levels recommended. Mildly dilated small bowel loops measure up to 4 cm in caliber. The distal small bowel are collapsed. A transition may be present in the midline anterior abdomen (63/2. Evaluation of the bowel is very limited due to anasarca. There is sigmoid diverticulosis without active inflammatory changes. The appendix is not identified. Vascular/Lymphatic: Mild aortoiliac atherosclerotic disease. The IVC is unremarkable. Right femoral central venous line with tip in the distal IVC. Reproductive: Hysterectomy. Other: Severe diffuse subcutaneous edema and anasarca. Musculoskeletal: Degenerative changes of the spine. No acute osseous pathology. IMPRESSION: 1. Findings most concerning for gastric pneumatosis with small pockets of air likely in the portal vein suggestive of bowel ischemia. Correlation with clinical exam and lactic acid levels recommended. 2. Findings of bowel obstruction with transition in the midline abdomen. 3. Sigmoid diverticulosis. 4. Moderate ascites, significantly increased since the prior CT. 5. Severe diffuse subcutaneous edema and anasarca. 6. Partially visualized small bilateral pleural effusions with partial consolidative changes of the visualized lower lobes which may represent atelectasis or pneumonia. Clinical correlation is  recommended. 7. Aortic Atherosclerosis (ICD10-I70.0). These results were called by telephone at the time of interpretation on 10/27/2019 at 12:52 am to Nurse Dossie Der, who verbally acknowledged these results. Electronically Signed   By: Anner Crete M.D.   On: 10/27/2019 01:05   DG Abd 1 View  Result Date: 10/26/2019 CLINICAL DATA:  Abdominal pain and recent paracentesis EXAM: ABDOMEN - 1 VIEW COMPARISON:  10/24/2019 FINDINGS: Scattered large and small bowel gas is noted although predominately centrally which suggests some persistent ascites despite recent paracentesis. Dialysis catheter is noted in the right femoral vein extending into the IVC. This is new from the prior exam. No bony abnormality is seen. No obstructive changes are noted. IMPRESSION: Changes suggestive of mild residual ascites. No other focal acute abnormality is noted. Electronically Signed   By: Inez Catalina M.D.   On: 10/26/2019 18:59   US Paracentesis  Result Date: 10/26/2019 INDICATION: Ascites, distension EXAM: ULTRASOUND GUIDED DIAGNOSTIC AND THERAPEUTIC PARACENTESIS MEDICATIONS: 1% LIDOCAINE LOCAL COMPLICATIONS: None immediate. PROCEDURE: Informed written consent was obtained from the patient's sister by phone and verified by ultrasound staff after a discussion of the risks, benefits and alternatives to treatment. A timeout was performed prior to the initiation of the procedure. Initial ultrasound scanning demonstrates a large amount of ascites within the left lower abdominal quadrant. The left lower abdomen was prepped and draped in the usual sterile fashion. 1% lidocaine was used for local anesthesia. Following this, a 6 Fr Safe-T-Centesis catheter was introduced. An ultrasound image was saved for documentation purposes. The paracentesis was performed. The catheter was removed and a dressing was applied. The patient tolerated the procedure well without immediate post procedural complication. FINDINGS: A total of  approximately 6.4 L of yellow peritoneal fluid was removed. Samples were sent to the laboratory as requested by the clinical team. IMPRESSION: Successful ultrasound-guided paracentesis yielding 6.4 liters of peritoneal fluid. Electronically Signed   By: Jerilynn Mages.  Shick M.D.   On: 10/26/2019 12:36     Medications:   . sodium chloride Stopped (10/26/19 1410)  . azithromycin Stopped (10/26/19 1432)  . cefTRIAXone (ROCEPHIN)  IV    . metronidazole 500 mg (10/27/19  0923)   . Chlorhexidine Gluconate Cloth  6 each Topical Daily  . furosemide  60 mg Intravenous BID  . mouth rinse  15 mL Mouth Rinse BID  . pantoprazole (PROTONIX) IV  40 mg Intravenous BID  . polyethylene glycol  17 g Oral Daily  . sodium chloride flush  3 mL Intravenous Q12H   sodium chloride, acetaminophen, alum & mag hydroxide-simeth, alum & mag hydroxide-simeth **AND** lidocaine, bisacodyl, Glycerin (Adult), midodrine, morphine injection, ondansetron (ZOFRAN) IV, sodium chloride flush  Assessment/ Plan:  [name] is a 64 y.o. black female with congestive heart failure, hypertension, hyperlipidemia, gout, COPD, anemia, cocaine abuse who is admitted to [facility] on 10/19/2019 for Peripheral edema [R60.9] Acute CHF (congestive heart failure) ([place]) [I50.9] AKI (acute kidney injury) ([place]) [N17.9] Congestive heart failure, unspecified HF chronicity, unspecified heart failure type ([place]) [I50.9]  1. Acute kidney injury on chronic kidney disease stage IIIB. Baseline creatinine of 1.66, GFR of 32. With history of proteinuria.  Chronic kidney disease secondary to hypertension  Acute renal failure secondary to acute cardiorenal syndrome  -Patient received  Dialysis with UF only session yesterday, with 1.5L fluid removal. -F/C draining clear yellow output -Patient stable ,in no respiratory distress,supplemetal O2 requirement down to 2L via [place] -No dialysis today -Started Lasix 60 mg BID -Will reassess her tomorrow    2. Hypotension  with acute exacerbation of systolic and diastolic congestive heart failure. Echocardiogram 06/22/19 EF of 25-30%.  -on 3 L nasal cannula -Blood pressure readings stays within low normal range -Patient is on Midodrine 2.5 mg TID - Cardiology team on board  3. Ascites/ Bacterial Peritonitis Patient is on IV antibiotics Gastroenterology team managing       LOS: 8 Jessicaann Overbaugh 10/19/202110:01 AM

## 2019-10-27 NOTE — Consult Note (Signed)
Reason for Consult:?bowel ischemia  Referring Physician: Judd Gaudier, MD (hospital medicine)  Katelyn Lane is an 64 y.o. female.  HPI: She was admitted a week ago with fluid overload/CHF exacerbation and AKI. She has required dialysis for fluid removal as she did not respond to diuretic infusion.  On October 15, she had an episode of dark emesis that was Gastroccult positive (specifically not frank hematemesis).  Lasix infusion was stopped, as was her home anticoagulant.  She has not had any further emesis.  Gastroenterology has been consulted.  She was placed on IV PPI.  She has ascites with hepatorenal syndrome.  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  She also has a history of cocaine abuse and was cocaine positive upon admission.  Today, she underwent large-volume paracentesis with approximately 6 L removed.  Fluid analysis was notable for elevated lactate dehydrogenase and amylase, as well as elevated neutrophils.  It is not clear from the electronic medical record, nor from the consulting physician what prompted the CT scan that was performed earlier tonight, however the radiologist commented on the significant ascites (post paracentesis), liver changes consistent with cirrhosis, air within the wall of the gastric fundus, and 2 small pockets of air along the falciform ligament.  They also suggested there was the possibility of a small bowel obstruction.  This raised the concern for possible bowel ischemia and general surgery is consulted in this context for further evaluation and management.    At the time of my consultation, the lactic acid that had been ordered over an hour prior, was just being drawn.  The patient is sleepy and somewhat irritated at being evaluated at this time.  She did tell me that she is not experiencing any abdominal pain whatsoever.  She denies any nausea and has not experienced any vomiting since the episode on October 15.   Past Medical  History:  Diagnosis Date  . CHF (congestive heart failure) ([place])   . COVID-19   . Hyperlipidemia   . Hypertension   . Renal disorder    Patient Active Problem List   Diagnosis Date Noted  . Cirrhosis of liver ([place]) 10/24/2019  . Cocaine abuse ([place]) 10/20/2019  . Peripheral edema   . Acute CHF (congestive heart failure) ([place]) 07/27/2019  . Prolonged QT interval 06/24/2019  . History of anemia due to chronic kidney disease 06/19/2019  . CHF, acute on chronic ([place]) 06/03/2019  . Acute on chronic combined systolic (congestive) and diastolic (congestive) heart failure ([place]) 06/03/2019  . Accelerated hypertension   . Ventricular tachycardia, non-sustained ([place])   . Chronic obstructive pulmonary disease ([place])   . Pulmonary hypertension ([place])   . Acute metabolic encephalopathy 65/99/3570  . History of substance abuse ([place]) 05/21/2019  . CHF (congestive heart failure) ([place]) 05/21/2019  . Acute systolic CHF (congestive heart failure) ([place]) 04/08/2019  . Acute on chronic combined systolic and diastolic CHF (congestive heart failure) ([place]) 03/24/2019  . COVID-19 03/06/2019  . Acute respiratory failure with hypoxia ([place]) 03/06/2019  . Hypokalemia 03/06/2019  . LV (left ventricular) mural thrombus 03/06/2019  . CKD (chronic kidney disease), stage III ([place]) 03/06/2019  . Nausea and vomiting 02/16/2019  . Abdominal pain 02/16/2019  . Rhabdomyolysis 02/16/2019  . AKI (acute kidney injury) ([place]) 02/16/2019  . Crack cocaine use 02/16/2019  . Tobacco abuse 02/16/2019  . Acute on chronic systolic CHF (congestive heart failure) ([place]) 02/16/2019  . Acute congestive heart failure ([place]) 02/13/2019  . Respiratory failure with  hypoxia ([place]) 01/31/2019  . Stroke ([place]) 01/18/2019  . Dilated cardiomyopathy ([place]) 12/31/2018  . Chest tightness 12/31/2018  . Elevated troponin 12/31/2018  . Hypertensive urgency 12/31/2018  . Hyperlipidemia 12/31/2018  . Depression 12/31/2018  . HFrEF (heart failure  with reduced ejection fraction) ([employer]) 12/29/2018     Past Surgical History:  Procedure Laterality Date  . RIGHT/LEFT HEART CATH AND CORONARY ANGIOGRAPHY N/A 12/30/2018   Procedure: RIGHT/LEFT HEART CATH AND CORONARY ANGIOGRAPHY;  Surgeon: Corey Skains, MD;  Location: [facility];  Service: Cardiovascular;  Laterality: N/A;    Family History  Problem Relation Age of Onset  . Breast cancer Neg Hx     Social History:  reports that she has been smoking. She has never used smokeless tobacco. She reports current drug use. Drug: Cocaine. She reports that she does not drink alcohol.  Allergies:  Allergies  Allergen Reactions  . Penicillins Hives, Rash and Other (See Comments)    Did it involve swelling of the face/tongue/throat, SOB, or low BP? No Did it involve sudden or severe rash/hives, skin peeling, or any reaction on the inside of your mouth or nose? Yes Did you need to seek medical attention at a hospital or doctor's office? Yes When did it last happen? >25 years If all above answers are "NO", may proceed with cephalosporin use.   . Ace Inhibitors Nausea And Vomiting    Medications: I have reviewed the patient's current medications.  Results for orders placed or performed during the hospital encounter of 10/19/19 (from the past 48 hour(s))  Comprehensive metabolic panel     Status: Abnormal   Collection Time: 10/25/19  4:21 AM  Result Value Ref Range   Sodium 138 135 - 145 mmol/L   Potassium 5.0 3.5 - 5.1 mmol/L   Chloride 105 98 - 111 mmol/L   CO2 22 22 - 32 mmol/L   Glucose, Bld 119 (H) 70 - 99 mg/dL    Comment: Glucose reference range applies only to samples taken after fasting for at least 8 hours.   BUN 61 (H) 8 - 23 mg/dL   Creatinine, Ser 3.25 (H) 0.44 - 1.00 mg/dL   Calcium 9.2 8.9 - 10.3 mg/dL   Total Protein 6.1 (L) 6.5 - 8.1 g/dL   Albumin 3.0 (L) 3.5 - 5.0 g/dL   AST 17 15 - 41 U/L   ALT 10 0 - 44 U/L   Alkaline Phosphatase 88 38 - 126  U/L   Total Bilirubin 2.2 (H) 0.3 - 1.2 mg/dL   GFR, Estimated 14 (L) >60 mL/min   Anion gap 11 5 - 15    Comment: Performed at [facility], [address] [place], [place] [address]  CBC     Status: Abnormal   Collection Time: 10/25/19  4:21 AM  Result Value Ref Range   WBC 10.5 4.0 - 10.5 K/uL   RBC 5.59 (H) 3.87 - 5.11 MIL/uL   Hemoglobin 13.2 12.0 - 15.0 g/dL   HCT 41.6 36 - 46 %   MCV 74.4 (L) 80.0 - 100.0 fL   MCH 23.6 (L) 26.0 - 34.0 pg   MCHC 31.7 30.0 - 36.0 g/dL   RDW 23.6 (H) 11.5 - 15.5 %   Platelets 302 150 - 400 K/uL   nRBC 0.0 0.0 - 0.2 %    Comment: Performed at [facility], [address], [place], [place] [address]  Hepatitis C antibody     Status: None  Collection Time: 10/25/19  4:21 AM  Result Value Ref Range   HCV Ab NON REACTIVE NON REACTIVE    Comment: (NOTE) Nonreactive HCV antibody screen is consistent with no HCV infections,  unless recent infection is suspected or other evidence exists to indicate HCV infection.  Performed at [facility], [place] [address] [place], [place] [address]   Iron and TIBC     Status: Abnormal   Collection Time: 10/25/19  4:21 AM  Result Value Ref Range   Iron 30 28 - 170 ug/dL   TIBC 172 (L) 250 - 450 ug/dL   Saturation Ratios 17 10.4 - 31.8 %   UIBC 142 ug/dL    Comment: Performed at [facility], [address] [place], [place] [address]  Procalcitonin - Baseline     Status: None   Collection Time: 10/25/19  4:21 AM  Result Value Ref Range   Procalcitonin 14.74 ng/mL    Comment:        Interpretation: PCT >= 10 ng/mL: Important systemic inflammatory response, almost exclusively due to severe bacterial sepsis or septic shock. (NOTE)       Sepsis PCT Algorithm           Lower Respiratory Tract                                      Infection PCT Algorithm    ----------------------------     ----------------------------         PCT < 0.25 ng/mL                PCT < 0.10  ng/mL          Strongly encourage             Strongly discourage   discontinuation of antibiotics    initiation of antibiotics    ----------------------------     -----------------------------       PCT 0.25 - 0.50 ng/mL            PCT 0.10 - 0.25 ng/mL               OR       >80% decrease in PCT            Discourage initiation of                                            antibiotics      Encourage discontinuation           of antibiotics    ----------------------------     -----------------------------         PCT >= 0.50 ng/mL              PCT 0.26 - 0.50 ng/mL                AND       <80% decrease in PCT             Encourage initiation of                                             antibiotics  Encourage continuation           of antibiotics    ----------------------------     -----------------------------        PCT >= 0.50 ng/mL                  PCT > 0.50 ng/mL               AND         increase in PCT                  Strongly encourage                                      initiation of antibiotics    Strongly encourage escalation           of antibiotics                                     -----------------------------                                           PCT <= 0.25 ng/mL                                                 OR                                        > 80% decrease in PCT                                      Discontinue / Do not initiate                                             antibiotics  Performed at [facility], [address] [place], [place] [address]   Hepatitis B surface antigen     Status: None   Collection Time: 10/25/19 11:19 AM  Result Value Ref Range   Hepatitis B Surface Ag NON REACTIVE NON REACTIVE    Comment: Performed at [facility] [address], [place], [place] [address]  Hepatitis B surface antibody,qualitative     Status: Abnormal   Collection Time: 10/25/19 11:19 AM  Result Value Ref Range    Hep B S Ab Reactive (A) NON REACTIVE    Comment: (NOTE) Consistent with immunity, greater than 9.9 mIU/mL.  Performed at [facility], [place] [address], [place], [place] [address]   Hepatitis B core antibody, total     Status: None   Collection Time: 10/25/19 11:19 AM  Result Value Ref Range   Hep B Core Total Ab NON REACTIVE NON REACTIVE    Comment: Performed at [facility] [address], [place], [place] [address]  Phosphorus  Status: None   Collection Time: 10/25/19 11:19 AM  Result Value Ref Range   Phosphorus 4.2 2.5 - 4.6 mg/dL    Comment: Performed at [facility], [address] [place], [place] [address]  Blood gas, arterial     Status: Abnormal   Collection Time: 10/25/19 11:31 AM  Result Value Ref Range   FIO2 0.34    Delivery systems NASAL CANNULA    pH, Arterial 7.36 7.35 - 7.45   pCO2 arterial 39 32 - 48 mmHg   pO2, Arterial 77 (L) 83 - 108 mmHg   Bicarbonate 22.0 20.0 - 28.0 mmol/L   Acid-base deficit 3.2 (H) 0.0 - 2.0 mmol/L   O2 Saturation 94.7 %   Patient temperature 37.0    Collection site LEFT RADIAL    Sample type ARTERIAL DRAW    Allens test (pass/fail) PASS PASS    Comment: Performed at [facility], [address] [place], [place] [address]  MRSA PCR Screening     Status: None   Collection Time: 10/25/19  2:50 PM   Specimen: Nasopharyngeal  Result Value Ref Range   MRSA by PCR NEGATIVE NEGATIVE    Comment:        The GeneXpert MRSA Assay (FDA approved for NASAL specimens only), is one component of a comprehensive MRSA colonization surveillance program. It is not intended to diagnose MRSA infection nor to guide or monitor treatment for MRSA infections. Performed at [facility], [address] [place], [place] [address]   CULTURE, BLOOD (ROUTINE X 2) w Reflex to ID Panel     Status: None (Preliminary result)   Collection Time: 10/25/19  4:29 PM   Specimen: BLOOD  Result Value Ref Range    Specimen Description BLOOD CENTRAL LINE    Special Requests      BOTTLES DRAWN AEROBIC AND ANAEROBIC Blood Culture adequate volume   Culture      NO GROWTH < 24 HOURS Performed at [facility], [address] [place], [place] [address]    Report Status PENDING   Comprehensive metabolic panel     Status: Abnormal   Collection Time: 10/26/19  6:45 AM  Result Value Ref Range   Sodium 139 135 - 145 mmol/L   Potassium 4.7 3.5 - 5.1 mmol/L   Chloride 105 98 - 111 mmol/L   CO2 24 22 - 32 mmol/L   Glucose, Bld 91 70 - 99 mg/dL    Comment: Glucose reference range applies only to samples taken after fasting for at least 8 hours.   BUN 61 (H) 8 - 23 mg/dL   Creatinine, Ser 3.25 (H) 0.44 - 1.00 mg/dL   Calcium 9.0 8.9 - 10.3 mg/dL   Total Protein 5.4 (L) 6.5 - 8.1 g/dL   Albumin 2.6 (L) 3.5 - 5.0 g/dL   AST 16 15 - 41 U/L   ALT 8 0 - 44 U/L   Alkaline Phosphatase 73 38 - 126 U/L   Total Bilirubin 2.2 (H) 0.3 - 1.2 mg/dL   GFR, Estimated 14 (L) >60 mL/min   Anion gap 10 5 - 15    Comment: Performed at [facility], [address] [place], [place] [address]  CBC     Status: Abnormal   Collection Time: 10/26/19  6:45 AM  Result Value Ref Range   WBC 10.7 (H) 4.0 - 10.5 K/uL   RBC 4.79 3.87 - 5.11 MIL/uL   Hemoglobin 11.4 (L) 12.0 - 15.0 g/dL  HCT 34.8 (L) 36 - 46 %   MCV 72.7 (L) 80.0 - 100.0 fL   MCH 23.8 (L) 26.0 - 34.0 pg   MCHC 32.8 30.0 - 36.0 g/dL   RDW 22.9 (H) 11.5 - 15.5 %   Platelets 260 150 - 400 K/uL   nRBC 0.0 0.0 - 0.2 %    Comment: Performed at [facility], [address], [place], [place] [address]  Lactate dehydrogenase (pleural or peritoneal fluid)     Status: Abnormal   Collection Time: 10/26/19 11:30 AM  Result Value Ref Range   LD, Fluid 277 (H) 3 - 23 U/L   Fluid Type-FLDH PERITONEAL     Comment: Performed at [facility], [address] [place], [place] [address] CORRECTED ON 10/18 AT 3825: PREVIOUSLY REPORTED AS  CYTO PERI   Body fluid cell count with differential     Status: Abnormal   Collection Time: 10/26/19 11:30 AM  Result Value Ref Range   Fluid Type-FCT PERITONEAL     Comment: CORRECTED ON 10/18 AT 1229: PREVIOUSLY REPORTED AS CYTO PERI   Color, Fluid YELLOW (A) YELLOW   Appearance, Fluid CLEAR (A) CLEAR   Total Nucleated Cell Count, Fluid 5,644 cu mm   Neutrophil Count, Fluid 98 %   Lymphs, Fluid 1 %   Monocyte-Macrophage-Serous Fluid 1 %    Comment: Performed at [facility], [address] [place], [place] [address]  Albumin, pleural or peritoneal fluid     Status: None   Collection Time: 10/26/19 11:30 AM  Result Value Ref Range   Albumin, Fluid 1.6 g/dL   Fluid Type-FALB PERITONEAL     Comment: Performed at [facility], [address] [place], [place] [address] CORRECTED ON 10/18 AT 1229: PREVIOUSLY REPORTED AS CYTO PERI   Glucose, pleural or peritoneal fluid     Status: None   Collection Time: 10/26/19 11:30 AM  Result Value Ref Range   Glucose, Fluid 66 mg/dL   Fluid Type-FGLU PERITONEAL     Comment: Performed at [facility], [address] [place], [place] [address] CORRECTED ON 10/18 AT 1229: PREVIOUSLY REPORTED AS CYTO PERI   Body fluid culture     Status: None (Preliminary result)   Collection Time: 10/26/19 11:30 AM   Specimen: Fluid  Result Value Ref Range   Specimen Description      FLUID Performed at [facility], [address], [place], [place] [address]    Special Requests      PERITONEAL Performed at [facility], [address] [place], [place] [address]    Gram Stain      ABUNDANT WBC PRESENT, PREDOMINANTLY PMN NO ORGANISMS SEEN Performed at [facility], [place] [address] [place], [place] [address]    Culture PENDING    Report Status PENDING   Amylase, pleural or peritoneal fluid     Status: None   Collection Time: 10/26/19 11:30 AM  Result Value Ref Range   Amylase, Fluid 630 U/L     Comment: NO NORMAL RANGE ESTABLISHED FOR THIS TEST   Fluid Type-FAMY Peritoneal     Comment: Performed at [facility], [address] [place], [place] [address]  Protein, pleural or peritoneal fluid     Status: None   Collection Time: 10/26/19 11:30 AM  Result Value Ref Range   Total protein, fluid 3.3 g/dL    Comment: (NOTE) No normal range established for this test Results should be evaluated  in conjunction with serum values    Fluid Type-FTP Peritoneal     Comment: Performed at [facility], [address] [place], [place] [address]    CT ABDOMEN PELVIS WO CONTRAST  Result Date: 10/27/2019 CLINICAL DATA:  64 year old female with abdominal pain. Concern for peritonitis. EXAM: CT ABDOMEN AND PELVIS WITHOUT CONTRAST TECHNIQUE: Multidetector CT imaging of the abdomen and pelvis was performed following the standard protocol without IV contrast. COMPARISON:  CT abdomen pelvis dated 08/15/2019. FINDINGS: Evaluation of this exam is limited in the absence of intravenous contrast. Lower chest: Partially visualized small bilateral pleural effusions. There is partial consolidative changes of the right lower lobe and majority of the visualized left lower lobe which may represent atelectasis or pneumonia. Clinical correlation is recommended. There is moderate cardiomegaly. Coronary vascular calcifications noted. There is hypoattenuation of the cardiac blood pool suggestive of anemia. Clinical correlation is recommended. Moderate ascites, significantly increased since the prior CT. Hepatobiliary: Slight irregularity of the liver contour may represent early changes of cirrhosis. Clinical correlation is recommended. No intrahepatic biliary ductal dilatation. The gallbladder is predominantly contracted. Pancreas: The pancreas is grossly unremarkable. Spleen: Normal in size without focal abnormality. Adrenals/Urinary Tract: The adrenal glands are unremarkable. There is no hydronephrosis  or nephrolithiasis on either side. The urinary bladder is decompressed around a Foley catheter. Stomach/Bowel: There is air within the wall of the gastric fundus consistent with pneumatosis and most concerning for bowel ischemia. Two small pockets of air in the periportal region and along the falciform ligament (35/2) most concerning for extraluminal air, possibly intravascular. Correlation with clinical exam and lactic acid levels recommended. Mildly dilated small bowel loops measure up to 4 cm in caliber. The distal small bowel are collapsed. A transition may be present in the midline anterior abdomen (63/2. Evaluation of the bowel is very limited due to anasarca. There is sigmoid diverticulosis without active inflammatory changes. The appendix is not identified. Vascular/Lymphatic: Mild aortoiliac atherosclerotic disease. The IVC is unremarkable. Right femoral central venous line with tip in the distal IVC. Reproductive: Hysterectomy. Other: Severe diffuse subcutaneous edema and anasarca. Musculoskeletal: Degenerative changes of the spine. No acute osseous pathology. IMPRESSION: 1. Findings most concerning for gastric pneumatosis with small pockets of air likely in the portal vein suggestive of bowel ischemia. Correlation with clinical exam and lactic acid levels recommended. 2. Findings of bowel obstruction with transition in the midline abdomen. 3. Sigmoid diverticulosis. 4. Moderate ascites, significantly increased since the prior CT. 5. Severe diffuse subcutaneous edema and anasarca. 6. Partially visualized small bilateral pleural effusions with partial consolidative changes of the visualized lower lobes which may represent atelectasis or pneumonia. Clinical correlation is recommended. 7. Aortic Atherosclerosis (ICD10-I70.0). These results were called by telephone at the time of interpretation on 10/27/2019 at 12:52 am to Nurse Dossie Der, who verbally acknowledged these results. Electronically Signed    By: Anner Crete M.D.   On: 10/27/2019 01:05   DG Abd 1 View  Result Date: 10/26/2019 CLINICAL DATA:  Abdominal pain and recent paracentesis EXAM: ABDOMEN - 1 VIEW COMPARISON:  10/24/2019 FINDINGS: Scattered large and small bowel gas is noted although predominately centrally which suggests some persistent ascites despite recent paracentesis. Dialysis catheter is noted in the right femoral vein extending into the IVC. This is new from the prior exam. No bony abnormality is seen. No obstructive changes are noted. IMPRESSION: Changes suggestive of mild residual ascites. No other focal acute abnormality is noted. Electronically Signed   By: Inez Catalina  M.D.   On: 10/26/2019 18:59   US Paracentesis  Result Date: 10/26/2019 INDICATION: Ascites, distension EXAM: ULTRASOUND GUIDED DIAGNOSTIC AND THERAPEUTIC PARACENTESIS MEDICATIONS: 1% LIDOCAINE LOCAL COMPLICATIONS: None immediate. PROCEDURE: Informed written consent was obtained from the patient's sister by phone and verified by ultrasound staff after a discussion of the risks, benefits and alternatives to treatment. A timeout was performed prior to the initiation of the procedure. Initial ultrasound scanning demonstrates a large amount of ascites within the left lower abdominal quadrant. The left lower abdomen was prepped and draped in the usual sterile fashion. 1% lidocaine was used for local anesthesia. Following this, a 6 Fr Safe-T-Centesis catheter was introduced. An ultrasound image was saved for documentation purposes. The paracentesis was performed. The catheter was removed and a dressing was applied. The patient tolerated the procedure well without immediate post procedural complication. FINDINGS: A total of approximately 6.4 L of yellow peritoneal fluid was removed. Samples were sent to the laboratory as requested by the clinical team. IMPRESSION: Successful ultrasound-guided paracentesis yielding 6.4 liters of peritoneal fluid. Electronically  Signed   By: Jerilynn Mages.  Shick M.D.   On: 10/26/2019 12:36    Review of Systems  Unable to perform ROS: Other  Patient not cooperative  Blood pressure 94/70, pulse 81, temperature 98.3 F (36.8 C), temperature source Oral, resp. rate 20, height 5\' 6"  (1.676 m), weight 108.2 kg, SpO2 95 %. Body mass index is 38.5 kg/m.  Physical Exam Vitals reviewed.  Constitutional:      General: She is not in acute distress.    Appearance: She is obese.     Comments: Drowsy but easily aroused to voice.  HENT:     Head: Normocephalic and atraumatic.     Nose: Nose normal.     Mouth/Throat:     Mouth: Mucous membranes are moist.  Eyes:     General: No scleral icterus.       Right eye: No discharge.        Left eye: No discharge.     Comments: Sclerae are muddy  Cardiovascular:     Rate and Rhythm: Normal rate and regular rhythm.  Pulmonary:     Effort: Pulmonary effort is normal. No respiratory distress.     Comments: On nasal cannula oxygen Abdominal:     Palpations: Abdomen is soft.     Tenderness: There is no abdominal tenderness. There is no guarding or rebound.     Comments: Extensive tissue edema (anasarca).  Fluid wave is present.  No peritoneal signs elicited.  Genitourinary:    Comments: Deferred Musculoskeletal:     Right lower leg: Edema present.     Left lower leg: Edema present.  Skin:    General: Skin is warm and dry.  Neurological:     General: No focal deficit present.  Psychiatric:     Comments: Irritable and somewhat uncooperative.     Assessment/Plan: While I was performing my evaluation and documentation, the lactic acid returned as 1.0 (within normal limits).  Her white blood cell count is stable at 10.3.  INR is elevated at 2.0.  The comprehensive metabolic panel is still in process.  Based upon labs available at the time of my evaluation, she has a MELD score of 26 and is at best, Marcello Moores class B cirrhotic.  Her abdominal exam is unremarkable aside from anasarca  and ascites; specifically, she does not have pain out of proportion to exam which is the sine qua non of intestinal ischemia nor  does she have any peritoneal signs.  The fluid analysis from her paracentesis is concerning for secondary bacterial peritonitis, but I do not see a surgical source to treat on her CT scan, nor is her examination revealing.  I think she is an extremely high surgical risk, regardless.  I would continue to treat her with broad-spectrum antibiotics at this time.  We will follow up the remainder of her labs and see her again later in the day.  Over 60 minutes was spent in the evaluation of this patient, greater than 50% of which entailed coordination of care including evaluation of labs, CT scans, and the electronic medical record to glean the recent medical history and her current hospital course.  Fredirick Maudlin 10/27/2019, 3:33 AM

## 2019-10-27 NOTE — Progress Notes (Signed)
GI Inpatient Follow-up Note  Subjective:  Patient seen in follow-up for secondary bacterial peritonitis. Fluid analysis from her ascitic fluid showed elevated LDH and amylase, as well as severely elevated PMN and she is appropriately on antibiotics. She was seen by General Surgery (Dr. Celine Ahr) early this morning at 0330 as consult where her lactic acid was normal and patient not showing any clinical signs of peritonitis. General Surgery did not advise any surgical intervention. Patient resting comfortably in hospital bed at time of my examination and denies any acute complaints. She reports she has not had any further emesis episodes. She denies any abdominal pain. She feels she is breathing better. Dialysis was withheld today.   Scheduled Inpatient Medications:  . Chlorhexidine Gluconate Cloth  6 each Topical Daily  . furosemide  60 mg Intravenous BID  . mouth rinse  15 mL Mouth Rinse BID  . pantoprazole (PROTONIX) IV  40 mg Intravenous BID  . polyethylene glycol  17 g Oral Daily  . sodium chloride flush  3 mL Intravenous Q12H    Continuous Inpatient Infusions:   . sodium chloride Stopped (10/26/19 1410)  . azithromycin Stopped (10/27/19 1741)  . cefTRIAXone (ROCEPHIN)  IV Stopped (10/27/19 1049)  . metronidazole 100 mL/hr at 10/27/19 1823    PRN Inpatient Medications:  sodium chloride, acetaminophen, alum & mag hydroxide-simeth **AND** lidocaine, bisacodyl, Glycerin (Adult), midodrine, morphine injection, ondansetron (ZOFRAN) IV, sodium chloride flush  Review of Systems: Constitutional: Weight is stable.  Eyes: No changes in vision. ENT: No oral lesions, sore throat.  GI: see HPI.  Heme/Lymph: No easy bruising.  CV: No chest pain.  GU: No hematuria.  Integumentary: No rashes.  Neuro: No headaches.  Psych: No depression/anxiety.  Endocrine: No heat/cold intolerance.  Allergic/Immunologic: No urticaria.  Resp: No cough, SOB.  Musculoskeletal: No joint swelling.    Physical  Examination: BP 107/64 (BP Location: Left Arm)   Pulse 87   Temp 98.3 F (36.8 C) (Oral)   Resp 17   Ht 5\' 6"  (1.676 m)   Wt 108.4 kg   SpO2 100%   BMI 38.57 kg/m  Gen: NAD, alert and oriented x 4 HEENT: PEERLA, EOMI, Neck: supple, no JVD or thyromegaly Chest: CTA bilaterally, no wheezes, crackles, or other adventitious sounds CV: RRR, no m/g/c/r Abd: soft, NT, ND, +BS in all four quadrants; no HSM, guarding, ridigity, or rebound tenderness Ext: no edema, well perfused with 2+ pulses, Skin: no rash or lesions noted Lymph: no LAD  Data: Lab Results  Component Value Date   WBC 10.3 10/27/2019   HGB 10.3 (L) 10/27/2019   HCT 31.4 (L) 10/27/2019   MCV 73.0 (L) 10/27/2019   PLT 245 10/27/2019   Recent Labs  Lab 10/25/19 0421 10/26/19 0645 10/27/19 0230  HGB 13.2 11.4* 10.3*   Lab Results  Component Value Date   NA 136 10/27/2019   K 4.5 10/27/2019   CL 103 10/27/2019   CO2 24 10/27/2019   BUN 66 (H) 10/27/2019   CREATININE 3.67 (H) 10/27/2019   Lab Results  Component Value Date   ALT 8 10/27/2019   AST 14 (L) 10/27/2019   ALKPHOS 68 10/27/2019   BILITOT 1.7 (H) 10/27/2019   Recent Labs  Lab 10/27/19 0230  INR 2.0*   Assessment:   1. Ascites - Fluid analysis consistent with secondary bacterial peritonitis likely 2/2 ischemic bowel. CT images early this morning reviewed. General Surgery has been consulted and appreciate recommendations. No urgent surgical intervention advised.  2.  UGI bleed - historical - Hgb stable at 11.4. On octreotide. No overt GI bleed.  3. Hypotension - Relative to renal failure, cirrhosis, now concerned about sepsis given peritoneal fluid analysis.  4. CHF.  5. Polysubtance abuse.   Plan:  1. Cultures negative to date. Gram stain showing abundant WBC, mostly PMNs consistent with SBP 2. Continue IV antibiotics 3. Plan for repeat paracentesis tomorrow with fluid analysis to help assess response to IV antibiotics and help to  guide therapy 4. Monitor H/H. Continue IV octreotide. 5. Following along with you   Please call with questions or concerns.    Octavia Bruckner, PA-C [facility] [phone] (Cell)

## 2019-10-27 NOTE — Progress Notes (Addendum)
PROGRESS NOTE    Katelyn Lane  QIH:[id number] DOB: [birth year] DOA: 10/19/2019 PCP: [name], MD   Brief Narrative: Taken from H&P and chart review. Katelyn Lane is a 64 y.o. 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 Found to have BNP greater than 4500, elevated troponin and AKI.  Multiple prior admissions with similar symptoms and complaints.  Prolonged hospitalization in August 2021 when she was discharged to SNF.  Not clear when she get out of SNF.  Continues to complain that she is homeless but per social worker she do have a house.  Not take her medication despite being provided with medical management.   Subjective: This morning says feelinga bit better. No abd pain or vomiting. Thinks breathing a bit better.  Assessment & Plan:   Principal Problem:   Acute CHF (congestive heart failure) (HCC) Active Problems:   AKI (acute kidney injury) ([place])   Crack cocaine use   Acute respiratory failure with hypoxia (HCC)   LV (left ventricular) mural thrombus   Cocaine abuse ([place])   Peripheral edema   Cirrhosis of liver ([place])  Concern for Upper GI bleed Overnight 10/15 several episodes dark emesis, gastroccult positive. No apparent hx of GI bleed, recent dx of cirrhosis does not appear has been evaluated for varices. Vomiting can be side effect of lasix and was on high dose gtt. No further episodes of emesis. No amemia, h/h is slowly downtrending - home rivaroxaban on hold - s/p pantop 80, cont 40 iv bid - cont prophylactic ceftriaxone 2 g daily (10/17> - gi consulted, appreciate recs - holding on octreotide per GI - possible EGD while inpatient - monitor h/h  Spontaneous bacterial peritonitis Fluid analysis suggestive of  possible secondary SBP. KUB w/o free air. Non-con CT showed possible bowel ischemia. Gen surg consulted, clinical picture not entirely consistent w/ that. - cont ceftriaxone/flagyl - culture neg to date - if no operative intervention planned, repeat paracentesis w/ cell count and culture may help guide therapy - f/u GI and gen surg recs  Acute on chronic combined systolic and diastolic heart failure. Acute respiratory failure  Patient with EF of 25 to 30% and grade 3 diastolic dysfunction on echocardiogram done in June 2021.  Multiple admissions for similar symptoms and being noncompliant.  Continue to use cocaine. Mild trop elevation, likely demand. Bipap attempted 10/17 given worsening respiratory status but pt did not tolerate. -trial of lasix gtt with albumin infusion not successful - given poor response to diuretic and worsening respiratory status, temporary dialysis catheter placed right femoral vein 10/17 with dialysis 10/17 and ultrafiltration 10/18. Patient did confirm with me she is full code.  -Continue to monitor daily weight and BMP. -Strict intake and output. 565 ml urine ysterday - nephrology trying lasix 60 mg IV bid today, no dialysis today - hyolding entresto and bb until bp stable - cardiology following, appreciate recs  Aspiration pneumonitis/pneumonia - worsening respiratory status 10/17 (tachypnea, increased WOB), cxr with poss LL consolidation, in setting of several episodes emisis overnight 10/16. procalcitonin markedly elevated (though sig ckd present). - started on ceftriaxone (10/17> ) as above, added azithromycin (10/17> ) and metronidazole (10/18> ) given concern for poss aspiration - checked blood cultures, ngtd - SLP swallow eval says mild dysphagia, regular diet ok  Cirrhosis Abdominal pain -10/15  complaining of diffuse abd pain, no other symptoms. Labs showed no elevated WBCs, chronically elevated bili. Mild lipase elevation, slightly improved today. U/s shows  mod ascites, non-specific gallbladder wall thickening, cirrhotic liver. Nodular liver was seen on u/s of august of this year. Suspect ascites consequence of some combination of chf and cirrhosis. Cirrhosis may be 2/2 nafld, possible alcohol use. Hep b negative on recent labs - IR consult for fluid analysis as this is a new dx  Acute hypoxic respiratory failure COPD Rrequiring 6 L on admission, o2 off oxygen was 89%, now weaned to 2 L, likely 2/2 above cardiac process.  - treatment as above  Hypotension - low normal pressures - diuresis as above - holding bb and entresto as above - resume home midodrine  AKI with CKD stage IIIb/IV. Likely cardiorenal syndrome, hepatorenal may contribute given new dx of likely cirrhosis.  baseline around 2. Cr worsening to 3.67 today, K wnl, bicarb normal. Oliguric -nephrology following, appreciate recs.  - dialysis as above, has temporary dialysis catheter -Continue to monitor renal function. -Avoid nephrotoxins. - will need permanent vas cath placement this week  Cocaine use.  According to H&P last use was 2 to 3 days prior to admission.  UDS positive for cocaine.  Patient refused to answer any questions regarding her cocaine use .  This will complicate overall prognosis and care.  History of ventricular thrombus in 01/2019 and hx of CVA - off xarelto as above  Social issues.  Apparently she was not competent enough to make her own decisions according to the psych evaluation done during recent hospitalization. -will involve psychiatry; social work will also attempt to contact patient's APS caseworker. Asked them again today to do this  DVT prophylaxis: holding for now given concern for ugib; SCDs Code Status: Full Family Communication: none at bedside Disposition Plan:  Status is: Inpatient  Remains inpatient appropriate because:Inpatient level of care appropriate due to severity of illness   Dispo: The patient is from: Home/homeless               Anticipated d/c is to: To be determined              Anticipated d/c date is: 4-7 days              Patient currently is not medically stable to d/c.   Consultants:   Nephrology, cardiology, GI, gen surg  Procedures: renal u/s  Data Reviewed: I have personally reviewed following labs and imaging studies   Objective: Vitals:   10/26/19 2310 10/27/19 0212 10/27/19 0519 10/27/19 0826  BP:  94/70 90/77 103/72  Pulse:  81 86 90  Resp:  20 18 16   Temp:  98.3 F (36.8 C) 97.6 F (36.4 C) 98.6 F (37 C)  TempSrc:  Oral Oral   SpO2:  95% 100% 98%  Weight: 108.2 kg  108.4 kg   Height: 5\' 6"  (1.676 m)       Intake/Output Summary (Last 24 hours) at 10/27/2019 1121 Last data filed at 10/27/2019 1025 Gross per 24 hour  Intake 1654.78 ml  Output 2465 ml  Net -810.22 ml   Filed Weights   10/25/19 1448 10/26/19 2310 10/27/19 0519  Weight: 115 kg 108.2 kg 108.4 kg    Examination:  General exam: sitting up, appears comforable  Respiratory system: scattered exp wheezes, not tachypneic Cardiovascular system: S1 & S2 heard, RRR. Soft systolic murmur. Distant heart sounds Gastrointestinal system: Soft, diffuse mild ttp, no rebound Central nervous  system: Alert and oriented. No focal neurological deficits. Extremities: pitting edema to abdomen Psychiatry: Judgement and insight appear impaired.  Lines: right femoral vein vas cath  CBC: Recent Labs  Lab 10/23/19 0450 10/25/19 0018 10/25/19 0421 10/26/19 0645 10/27/19 0230  WBC 4.4 10.5 10.5 10.7* 10.3  HGB 11.7* 13.4 13.2 11.4* 10.3*  HCT 36.3 42.3 41.6 34.8* 31.4*  MCV 74.4* 75.9* 74.4* 72.7* 73.0*  PLT 302 307 302 260 956   Basic Metabolic Panel: Recent Labs  Lab 10/24/19 0545 10/25/19 0018 10/25/19 0421 10/25/19 1119 10/26/19 0645 10/27/19 0230  NA 138 138 138  --  139 136  K 4.7 5.4* 5.0  --  4.7 4.5  CL 103 105 105  --  105 103  CO2 22 23 22   --  24 24  GLUCOSE 117* 124* 119*  --  91 111*  BUN 55* 61* 61*   --  61* 66*  CREATININE 2.81* 3.27* 3.25*  --  3.25* 3.67*  CALCIUM 9.4 9.5 9.2  --  9.0 9.0  MG  --  3.0*  --   --   --   --   PHOS  --   --   --  4.2  --   --    GFR: Estimated Creatinine Clearance: 19.5 mL/min (A) (by C-G formula based on SCr of 3.67 mg/dL (H)). Liver Function Tests: Recent Labs  Lab 10/23/19 0450 10/24/19 0545 10/25/19 0421 10/26/19 0645 10/27/19 0230  AST 23 25 17 16  14*  ALT 15 14 10 8 8   ALKPHOS 119 113 88 73 68  BILITOT 1.4* 2.3* 2.2* 2.2* 1.7*  PROT 6.0* 7.0 6.1* 5.4* 5.0*  ALBUMIN 2.5*  2.5* 3.2* 3.0* 2.6* 2.4*   Recent Labs  Lab 10/23/19 0450 10/24/19 0545  LIPASE 79* 55*   No results for input(s): AMMONIA in the last 168 hours. Coagulation Profile: Recent Labs  Lab 10/27/19 0230  INR 2.0*   Cardiac Enzymes: No results for input(s): CKTOTAL, CKMB, CKMBINDEX, TROPONINI in the last 168 hours. BNP (last 3 results) No results for input(s): PROBNP in the last 8760 hours. HbA1C: No results for input(s): HGBA1C in the last 72 hours. CBG: No results for input(s): GLUCAP in the last 168 hours. Lipid Profile: No results for input(s): CHOL, HDL, LDLCALC, TRIG, CHOLHDL, LDLDIRECT in the last 72 hours. Thyroid Function Tests: No results for input(s): TSH, T4TOTAL, FREET4, T3FREE, THYROIDAB in the last 72 hours. Anemia Panel: Recent Labs    10/25/19 0421  TIBC 172*  IRON 30   Sepsis Labs: Recent Labs  Lab 10/25/19 0421 10/27/19 0320  PROCALCITON 14.74  --   LATICACIDVEN  --  1.0    Recent Results (from the past 240 hour(s))  Respiratory Panel by RT PCR (Flu A&B, Covid) - Nasopharyngeal Swab     Status: None   Collection Time: 10/19/19 11:13 PM   Specimen: Nasopharyngeal Swab  Result Value Ref Range Status   SARS Coronavirus 2 by RT PCR NEGATIVE NEGATIVE Final    Comment: (NOTE) SARS-CoV-2 target nucleic acids are NOT DETECTED.  The SARS-CoV-2 RNA is generally detectable in upper respiratoy specimens during the acute phase of  infection. The lowest concentration of SARS-CoV-2 viral copies this assay can detect is 131 copies/mL. A negative result does not preclude SARS-Cov-2 infection and should not be used as the sole basis for treatment or other patient management decisions. A negative result may occur with  improper specimen collection/handling, submission of specimen other than nasopharyngeal swab,  presence of viral mutation(s) within the areas targeted by this assay, and inadequate number of viral copies (<131 copies/mL). A negative result must be combined with clinical observations, patient history, and epidemiological information. The expected result is Negative.  Fact Sheet for Patients:  PinkCheek.be  Fact Sheet for Healthcare Providers:  GravelBags.it  This test is no t yet approved or cleared by the [place] FDA and  has been authorized for detection and/or diagnosis of SARS-CoV-2 by FDA under an Emergency Use Authorization (EUA). This EUA will remain  in effect (meaning this test can be used) for the duration of the COVID-19 declaration under Section 564(b)(1) of the Act, 21 U.S.C. section 360bbb-3(b)(1), unless the authorization is terminated or revoked sooner.     Influenza A by PCR NEGATIVE NEGATIVE Final   Influenza B by PCR NEGATIVE NEGATIVE Final    Comment: (NOTE) The Xpert Xpress SARS-CoV-2/FLU/RSV assay is intended as an aid in  the diagnosis of influenza from Nasopharyngeal swab specimens and  should not be used as a sole basis for treatment. Nasal washings and  aspirates are unacceptable for Xpert Xpress SARS-CoV-2/FLU/RSV  testing.  Fact Sheet for Patients: PinkCheek.be  Fact Sheet for Healthcare Providers: GravelBags.it  This test is not yet approved or cleared by the [place] FDA and  has been authorized for detection and/or diagnosis of SARS-CoV-2  by  FDA under an Emergency Use Authorization (EUA). This EUA will remain  in effect (meaning this test can be used) for the duration of the  Covid-19 declaration under Section 564(b)(1) of the Act, 21  U.S.C. section 360bbb-3(b)(1), unless the authorization is  terminated or revoked. Performed at [facility], [address] [place], [place] [address]   MRSA PCR Screening     Status: None   Collection Time: 10/25/19  2:50 PM   Specimen: Nasopharyngeal  Result Value Ref Range Status   MRSA by PCR NEGATIVE NEGATIVE Final    Comment:        The GeneXpert MRSA Assay (FDA approved for NASAL specimens only), is one component of a comprehensive MRSA colonization surveillance program. It is not intended to diagnose MRSA infection nor to guide or monitor treatment for MRSA infections. Performed at [facility], [address] [place], [place] [address]   CULTURE, BLOOD (ROUTINE X 2) w Reflex to ID Panel     Status: None (Preliminary result)   Collection Time: 10/25/19  4:29 PM   Specimen: BLOOD  Result Value Ref Range Status   Specimen Description BLOOD CENTRAL LINE  Final   Special Requests   Final    BOTTLES DRAWN AEROBIC AND ANAEROBIC Blood Culture adequate volume   Culture   Final    NO GROWTH 2 DAYS Performed at [facility], [address] [place], [place] [address]    Report Status PENDING  Incomplete  CULTURE, BLOOD (ROUTINE X 2) w Reflex to ID Panel     Status: None (Preliminary result)   Collection Time: 10/26/19  6:45 AM   Specimen: BLOOD  Result Value Ref Range Status   Specimen Description BLOOD BLOOD LEFT HAND  Final   Special Requests   Final    BOTTLES DRAWN AEROBIC AND ANAEROBIC Blood Culture adequate volume   Culture   Final    NO GROWTH 1 DAY Performed at [facility], [address] [place], [place] [address]    Report Status PENDING  Incomplete  Body fluid culture     Status: None (  Preliminary result)    Collection Time: 10/26/19 11:30 AM   Specimen: Fluid  Result Value Ref Range Status   Specimen Description   Final    FLUID Performed at [facility], [address] [place], [place] [address]    Special Requests   Final    PERITONEAL Performed at [facility], [address] [place], [place] [address]    Gram Stain   Final    ABUNDANT WBC PRESENT, PREDOMINANTLY PMN NO ORGANISMS SEEN    Culture   Final    NO GROWTH < 12 HOURS Performed at [facility] [address], [place], [place] [address]    Report Status PENDING  Incomplete     Radiology Studies: CT ABDOMEN PELVIS WO CONTRAST  Result Date: 10/27/2019 CLINICAL DATA:  64 year old female with abdominal pain. Concern for peritonitis. EXAM: CT ABDOMEN AND PELVIS WITHOUT CONTRAST TECHNIQUE: Multidetector CT imaging of the abdomen and pelvis was performed following the standard protocol without IV contrast. COMPARISON:  CT abdomen pelvis dated 08/15/2019. FINDINGS: Evaluation of this exam is limited in the absence of intravenous contrast. Lower chest: Partially visualized small bilateral pleural effusions. There is partial consolidative changes of the right lower lobe and majority of the visualized left lower lobe which may represent atelectasis or pneumonia. Clinical correlation is recommended. There is moderate cardiomegaly. Coronary vascular calcifications noted. There is hypoattenuation of the cardiac blood pool suggestive of anemia. Clinical correlation is recommended. Moderate ascites, significantly increased since the prior CT. Hepatobiliary: Slight irregularity of the liver contour may represent early changes of cirrhosis. Clinical correlation is recommended. No intrahepatic biliary ductal dilatation. The gallbladder is predominantly contracted. Pancreas: The pancreas is grossly unremarkable. Spleen: Normal in size without focal abnormality. Adrenals/Urinary Tract: The adrenal glands are unremarkable.  There is no hydronephrosis or nephrolithiasis on either side. The urinary bladder is decompressed around a Foley catheter. Stomach/Bowel: There is air within the wall of the gastric fundus consistent with pneumatosis and most concerning for bowel ischemia. Two small pockets of air in the periportal region and along the falciform ligament (35/2) most concerning for extraluminal air, possibly intravascular. Correlation with clinical exam and lactic acid levels recommended. Mildly dilated small bowel loops measure up to 4 cm in caliber. The distal small bowel are collapsed. A transition may be present in the midline anterior abdomen (63/2. Evaluation of the bowel is very limited due to anasarca. There is sigmoid diverticulosis without active inflammatory changes. The appendix is not identified. Vascular/Lymphatic: Mild aortoiliac atherosclerotic disease. The IVC is unremarkable. Right femoral central venous line with tip in the distal IVC. Reproductive: Hysterectomy. Other: Severe diffuse subcutaneous edema and anasarca. Musculoskeletal: Degenerative changes of the spine. No acute osseous pathology. IMPRESSION: 1. Findings most concerning for gastric pneumatosis with small pockets of air likely in the portal vein suggestive of bowel ischemia. Correlation with clinical exam and lactic acid levels recommended. 2. Findings of bowel obstruction with transition in the midline abdomen. 3. Sigmoid diverticulosis. 4. Moderate ascites, significantly increased since the prior CT. 5. Severe diffuse subcutaneous edema and anasarca. 6. Partially visualized small bilateral pleural effusions with partial consolidative changes of the visualized lower lobes which may represent atelectasis or pneumonia. Clinical correlation is recommended. 7. Aortic Atherosclerosis (ICD10-I70.0). These results were called by telephone at the time of interpretation on 10/27/2019 at 12:52 am to Nurse Dossie Der, who verbally acknowledged these  results. Electronically Signed   By: Anner Crete M.D.   On: 10/27/2019 01:05  DG Abd 1 View  Result Date: 10/26/2019 CLINICAL DATA:  Abdominal pain and recent paracentesis EXAM: ABDOMEN - 1 VIEW COMPARISON:  10/24/2019 FINDINGS: Scattered large and small bowel gas is noted although predominately centrally which suggests some persistent ascites despite recent paracentesis. Dialysis catheter is noted in the right femoral vein extending into the IVC. This is new from the prior exam. No bony abnormality is seen. No obstructive changes are noted. IMPRESSION: Changes suggestive of mild residual ascites. No other focal acute abnormality is noted. Electronically Signed   By: Inez Catalina M.D.   On: 10/26/2019 18:59   US Paracentesis  Result Date: 10/26/2019 INDICATION: Ascites, distension EXAM: ULTRASOUND GUIDED DIAGNOSTIC AND THERAPEUTIC PARACENTESIS MEDICATIONS: 1% LIDOCAINE LOCAL COMPLICATIONS: None immediate. PROCEDURE: Informed written consent was obtained from the patient's sister by phone and verified by ultrasound staff after a discussion of the risks, benefits and alternatives to treatment. A timeout was performed prior to the initiation of the procedure. Initial ultrasound scanning demonstrates a large amount of ascites within the left lower abdominal quadrant. The left lower abdomen was prepped and draped in the usual sterile fashion. 1% lidocaine was used for local anesthesia. Following this, a 6 Fr Safe-T-Centesis catheter was introduced. An ultrasound image was saved for documentation purposes. The paracentesis was performed. The catheter was removed and a dressing was applied. The patient tolerated the procedure well without immediate post procedural complication. FINDINGS: A total of approximately 6.4 L of yellow peritoneal fluid was removed. Samples were sent to the laboratory as requested by the clinical team. IMPRESSION: Successful ultrasound-guided paracentesis yielding 6.4 liters of  peritoneal fluid. Electronically Signed   By: Jerilynn Mages.  Shick M.D.   On: 10/26/2019 12:36    Scheduled Meds: . Chlorhexidine Gluconate Cloth  6 each Topical Daily  . furosemide  60 mg Intravenous BID  . mouth rinse  15 mL Mouth Rinse BID  . pantoprazole (PROTONIX) IV  40 mg Intravenous BID  . polyethylene glycol  17 g Oral Daily  . sodium chloride flush  3 mL Intravenous Q12H   Continuous Infusions: . sodium chloride Stopped (10/26/19 1410)  . azithromycin Stopped (10/26/19 1432)  . cefTRIAXone (ROCEPHIN)  IV 2 g (10/27/19 1019)  . metronidazole 500 mg (10/27/19 0908)     LOS: 8 days   Time spent: 35 minutes.  Desma Maxim, MD [facility]  If 7PM-7AM, please contact night-coverage [email or web address]  10/27/2019, 11:21 AM

## 2019-10-28 ENCOUNTER — Encounter: Payer: Self-pay | Admitting: Family Medicine

## 2019-10-28 ENCOUNTER — Encounter
Admission: EM | Disposition: A | Discharge status: Home / Self Care | Payer: Self-pay | Source: Home / Self Care | Attending: Internal Medicine

## 2019-10-28 DIAGNOSIS — I5041 Acute combined systolic (congestive) and diastolic (congestive) heart failure: Secondary | ICD-10-CM | POA: Diagnosis not present

## 2019-10-28 DIAGNOSIS — N186 End stage renal disease: Secondary | ICD-10-CM

## 2019-10-28 DIAGNOSIS — N179 Acute kidney failure, unspecified: Secondary | ICD-10-CM | POA: Diagnosis not present

## 2019-10-28 DIAGNOSIS — J9601 Acute respiratory failure with hypoxia: Secondary | ICD-10-CM | POA: Diagnosis not present

## 2019-10-28 DIAGNOSIS — Z992 Dependence on renal dialysis: Secondary | ICD-10-CM

## 2019-10-28 DIAGNOSIS — R109 Unspecified abdominal pain: Secondary | ICD-10-CM | POA: Diagnosis not present

## 2019-10-28 DIAGNOSIS — N185 Chronic kidney disease, stage 5: Secondary | ICD-10-CM | POA: Diagnosis not present

## 2019-10-28 HISTORY — PX: DIALYSIS/PERMA CATHETER INSERTION: CATH118288

## 2019-10-28 LAB — BASIC METABOLIC PANEL
Anion gap: 10 (ref 5–15)
BUN: 70 mg/dL — ABNORMAL HIGH (ref 8–23)
CO2: 25 mmol/L (ref 22–32)
Calcium: 9 mg/dL (ref 8.9–10.3)
Chloride: 101 mmol/L (ref 98–111)
Creatinine, Ser: 4.01 mg/dL — ABNORMAL HIGH (ref 0.44–1.00)
GFR, Estimated: 11 mL/min — ABNORMAL LOW (ref >60)
Glucose, Bld: 104 mg/dL — ABNORMAL HIGH (ref 70–99)
Potassium: 4 mmol/L (ref 3.5–5.1)
Sodium: 136 mmol/L (ref 135–145)

## 2019-10-28 LAB — CBC
HCT: 30.4 % — ABNORMAL LOW (ref 36.0–46.0)
Hemoglobin: 10 g/dL — ABNORMAL LOW (ref 12.0–15.0)
MCH: 23.4 pg — ABNORMAL LOW (ref 26.0–34.0)
MCHC: 32.9 g/dL (ref 30.0–36.0)
MCV: 71 fL — ABNORMAL LOW (ref 80.0–100.0)
Platelets: 222 10*3/uL (ref 150–400)
RBC: 4.28 MIL/uL (ref 3.87–5.11)
RDW: 22.1 % — ABNORMAL HIGH (ref 11.5–15.5)
WBC: 7.7 10*3/uL (ref 4.0–10.5)
nRBC: 0.3 % — ABNORMAL HIGH (ref 0.0–0.2)

## 2019-10-28 LAB — CYTOLOGY - NON PAP

## 2019-10-28 LAB — QUANTIFERON-TB GOLD PLUS (RQFGPL)
QuantiFERON Mitogen Value: 0.47 IU/mL
QuantiFERON Nil Value: 0.01 IU/mL
QuantiFERON TB1 Ag Value: 0 IU/mL
QuantiFERON TB2 Ag Value: 0 IU/mL

## 2019-10-28 LAB — QUANTIFERON-TB GOLD PLUS: QuantiFERON-TB Gold Plus: UNDETERMINED — AB

## 2019-10-28 SURGERY — DIALYSIS/PERMA CATHETER INSERTION
Anesthesia: Moderate Sedation

## 2019-10-28 MED ORDER — HYDROMORPHONE HCL 1 MG/ML IJ SOLN: 1.0000 mg | Freq: Once | INTRAMUSCULAR | Status: DC | PRN

## 2019-10-28 MED ORDER — FENTANYL CITRATE (PF) 100 MCG/2ML IJ SOLN
INTRAMUSCULAR | Status: AC
  Filled 2019-10-28: qty 2

## 2019-10-28 MED ORDER — ONDANSETRON HCL 4 MG/2ML IJ SOLN: 4.0000 mg | Freq: Four times a day (QID) | INTRAMUSCULAR | Status: DC | PRN

## 2019-10-28 MED ORDER — METHYLPREDNISOLONE SODIUM SUCC 125 MG IJ SOLR: 125.0000 mg | Freq: Once | INTRAMUSCULAR | Status: DC | PRN

## 2019-10-28 MED ORDER — MIDAZOLAM HCL 2 MG/2ML IJ SOLN
INTRAMUSCULAR | Status: DC | PRN
  Administered 2019-10-28 (×2): 1 mg via INTRAVENOUS

## 2019-10-28 MED ORDER — HEPARIN SODIUM (PORCINE) 1000 UNIT/ML IJ SOLN
INTRAMUSCULAR | Status: AC
  Filled 2019-10-28: qty 1

## 2019-10-28 MED ORDER — DIPHENHYDRAMINE HCL 50 MG/ML IJ SOLN: 50.0000 mg | Freq: Once | INTRAMUSCULAR | Status: DC | PRN

## 2019-10-28 MED ORDER — SODIUM CHLORIDE 0.9 % IV SOLN: INTRAVENOUS | Status: DC

## 2019-10-28 MED ORDER — FAMOTIDINE 20 MG PO TABS: 40.0000 mg | ORAL_TABLET | Freq: Once | ORAL | Status: DC | PRN

## 2019-10-28 MED ORDER — MIDAZOLAM HCL 2 MG/ML PO SYRP: 8.0000 mg | ORAL_SOLUTION | Freq: Once | ORAL | Status: DC | PRN

## 2019-10-28 MED ORDER — FENTANYL CITRATE (PF) 100 MCG/2ML IJ SOLN
INTRAMUSCULAR | Status: DC | PRN
  Administered 2019-10-28 (×2): 50 ug via INTRAVENOUS

## 2019-10-28 MED ORDER — MIDAZOLAM HCL 5 MG/5ML IJ SOLN
INTRAMUSCULAR | Status: AC
  Filled 2019-10-28: qty 5

## 2019-10-28 SURGICAL SUPPLY — 13 items
BIOPATCH RED 1 DISK 7.0 (GAUZE/BANDAGES/DRESSINGS) ×2 IMPLANT
CATH PALINDROME-P 19CM W/VT (CATHETERS) ×1 IMPLANT
DERMABOND ADVANCED (GAUZE/BANDAGES/DRESSINGS) ×2
DERMABOND ADVANCED .7 DNX12 (GAUZE/BANDAGES/DRESSINGS) IMPLANT
DRAPE INCISE IOBAN 66X45 STRL (DRAPES) ×1 IMPLANT
FORCEPS HALSTEAD CVD 5IN STRL (INSTRUMENTS) ×1 IMPLANT
NDL ENTRY 21GA 7CM ECHOTIP (NEEDLE) IMPLANT
NEEDLE ENTRY 21GA 7CM ECHOTIP (NEEDLE) ×2 IMPLANT
PACK ANGIOGRAPHY (CUSTOM PROCEDURE TRAY) ×1 IMPLANT
SET INTRO CAPELLA COAXIAL (SET/KITS/TRAYS/PACK) ×1 IMPLANT
SUT MNCRL AB 4-0 PS2 18 (SUTURE) ×1 IMPLANT
SUT SILK 0 FSL (SUTURE) ×1 IMPLANT
WIRE NITINOL .018 (WIRE) ×1 IMPLANT

## 2019-10-28 NOTE — Progress Notes (Addendum)
[facility] SURGICAL PROGRESS NOTE (cpt [phone])  Hospital Day(s): 9.   Interval History: Patient seen and examined, no acute events or new complaints overnight. Patient reports she is doing fine. Her biggest complaint is not getting any help to get pulled up in bed. She denies fever, chills, nausea, emesis, or abdominal pain. Her leukocytosis remains resolved at 7.7. Renal function consistent with degree of CKD. NPO this morning but previously tolerating PO.     Review of Systems:  Constitutional: denies fever, chills  HEENT: denies cough or congestion  Respiratory: denies any shortness of breath  Cardiovascular: denies chest pain or palpitations  Gastrointestinal: denies abdominal pain, N/V, or diarrhea/and bowel function as per interval history Genitourinary: denies burning with urination or urinary frequency   Vital signs in last 24 hours: [min-max] current  Temp:  [97.7 F (36.5 C)-98.3 F (36.8 C)] 97.7 F (36.5 C) (10/20 0828) Pulse Rate:  [83-88] 83 (10/20 0828) Resp:  [16-18] 18 (10/20 0828) BP: (90-107)/(47-74) 90/47 (10/20 0828) SpO2:  [97 %-100 %] 97 % (10/20 0828) Weight:  [108.3 kg] 108.3 kg (10/20 0334)     Height: 5\' 6"  (167.6 cm) Weight: 108.3 kg BMI (Calculated): 38.55   Intake/Output last 2 shifts:  10/19 0701 - 10/20 0700 In: 818.5 [P.O.:360; I.V.:3; IV Piggyback:455.5] Out: 2775 [Urine:2775]   Physical Exam:  Constitutional: alert, cooperative and no distress  HENT: normocephalic without obvious abnormality  Eyes: PERRL, EOM's grossly intact and symmetric  Respiratory: breathing non-labored at rest, on  Gastrointestinal: Soft, non-tender, and non-distended. No rebound/guarding. Certainly no peritonitis    Labs:  CBC Latest Ref Rng & Units 10/28/2019 10/27/2019 10/26/2019  WBC 4.0 - 10.5 K/uL 7.7 10.3 10.7(H)  Hemoglobin 12.0 - 15.0 g/dL 10.0(L) 10.3(L) 11.4(L)  Hematocrit 36 - 46 % 30.4(L) 31.4(L) 34.8(L)  Platelets 150 - 400 K/uL 222  245 260   CMP Latest Ref Rng & Units 10/28/2019 10/27/2019 10/26/2019  Glucose 70 - 99 mg/dL 104(H) 111(H) 91  BUN 8 - 23 mg/dL 70(H) 66(H) 61(H)  Creatinine 0.44 - 1.00 mg/dL 4.01(H) 3.67(H) 3.25(H)  Sodium 135 - 145 mmol/L 136 136 139  Potassium 3.5 - 5.1 mmol/L 4.0 4.5 4.7  Chloride 98 - 111 mmol/L 101 103 105  CO2 22 - 32 mmol/L 25 24 24   Calcium 8.9 - 10.3 mg/dL 9.0 9.0 9.0  Total Protein 6.5 - 8.1 g/dL - 5.0(L) 5.4(L)  Total Bilirubin 0.3 - 1.2 mg/dL - 1.7(H) 2.2(H)  Alkaline Phos 38 - 126 U/L - 68 73  AST 15 - 41 U/L - 14(L) 16  ALT 0 - 44 U/L - 8 8     Imaging studies: No new pertinent imaging studies   Assessment/Plan: (ICD-10's: R10.9) 64 y.o. female with multiple comorbid conditions, without any abdominal pain, no evidence of peritonitis onxamination, and reassuring laboratory results. I certainly do not feel she has any acute surgical issues at this time. Nothing further to add from general surgery standpoint. We will sign off. Further management per primary and consulting services. We will be available if needed.   All of the above findings and recommendations were discussed with the patient.  -- Edison Simon, PA-C [facility] 10/28/2019, 8:55 AM [phone] M-F: 7am - 4pm  I agree with the above documentation.

## 2019-10-28 NOTE — Progress Notes (Signed)
   10/28/19 1010  Clinical Encounter Type  Visited With Patient  Visit Type Follow-up;Spiritual support;Social support  Referral From Chaplain  Consult/Referral To Chaplain  I was walking by Pt room and Pt called me in. I entered Pt room and Pt was alert and talking. I engage the Pt for a few minutes before I recognized who she was. Pt asked me to pray for her. I prayed for Pt. Pt wanted me to let [name] know that I had talk to her and that she wanted to see her. I will follow-up with Pt.

## 2019-10-28 NOTE — Interval H&P Note (Signed)
History and Physical Interval Note:  10/28/2019 2:50 PM  Katelyn Lane  has presented today for surgery, with the diagnosis of ESRD.  The various methods of treatment have been discussed with the patient and family. After consideration of risks, benefits and other options for treatment, the patient has consented to  Procedure(s): DIALYSIS/PERMA CATHETER INSERTION (N/A) as a surgical intervention.  The patient's history has been reviewed, patient examined, no change in status, stable for surgery.  I have reviewed the patient's chart and labs.  Questions were answered to the patient's satisfaction.     Hortencia Pilar

## 2019-10-28 NOTE — Progress Notes (Signed)
[facility] Gastroenterology Inpatient Progress Note  Subjective: Patient seen for f/u SBP, UGI bleed. No gross GI bleeding noted since hospital admission. Octreotide not ordered but patient is stable.Patient somewhat confused but in no distressed. Converses and is alert.  Denies pain.  Objective: Vital signs in last 24 hours: Temp:  [97.7 F (36.5 C)-98.6 F (37 C)] 98.6 F (37 C) (10/20 2025) Pulse Rate:  [63-98] 87 (10/20 2025) Resp:  [12-23] 16 (10/20 2025) BP: (74-110)/(47-74) 94/74 (10/20 2025) SpO2:  [92 %-100 %] 99 % (10/20 2025) Weight:  [108.3 kg] 108.3 kg (10/20 0334) Blood pressure 94/74, pulse 87, temperature 98.6 F (37 C), resp. rate 16, height 5\' 6"  (1.676 m), weight 108.3 kg, SpO2 99 %.    Intake/Output from previous day: 10/19 0701 - 10/20 0700 In: 818.5 [P.O.:360; I.V.:3; IV Piggyback:455.5] Out: 2775 [Urine:2775]  Intake/Output this shift: Total I/O In: -  Out: 1 [Stool:1]   General appearance: Alert, somewhat confused. Resp: CTA  Cardio:  RRR GI: Distended, BS hypoactive. No rebound. No masses. Extremities:  Trace edema.   Lab Results: Results for orders placed or performed during the hospital encounter of 10/19/19 (from the past 24 hour(s))  CBC     Status: Abnormal   Collection Time: 10/28/19  5:03 AM  Result Value Ref Range   WBC 7.7 4.0 - 10.5 K/uL   RBC 4.28 3.87 - 5.11 MIL/uL   Hemoglobin 10.0 (L) 12.0 - 15.0 g/dL   HCT 30.4 (L) 36 - 46 %   MCV 71.0 (L) 80.0 - 100.0 fL   MCH 23.4 (L) 26.0 - 34.0 pg   MCHC 32.9 30.0 - 36.0 g/dL   RDW 22.1 (H) 11.5 - 15.5 %   Platelets 222 150 - 400 K/uL   nRBC 0.3 (H) 0.0 - 0.2 %  Basic metabolic panel     Status: Abnormal   Collection Time: 10/28/19  5:03 AM  Result Value Ref Range   Sodium 136 135 - 145 mmol/L   Potassium 4.0 3.5 - 5.1 mmol/L   Chloride 101 98 - 111 mmol/L   CO2 25 22 - 32 mmol/L   Glucose, Bld 104 (H) 70 - 99 mg/dL   BUN 70 (H) 8 - 23 mg/dL   Creatinine, Ser 4.01 (H) 0.44  - 1.00 mg/dL   Calcium 9.0 8.9 - 10.3 mg/dL   GFR, Estimated 11 (L) >60 mL/min   Anion gap 10 5 - 15     Recent Labs    10/26/19 0645 10/27/19 0230 10/28/19 0503  WBC 10.7* 10.3 7.7  HGB 11.4* 10.3* 10.0*  HCT 34.8* 31.4* 30.4*  PLT 260 245 222   BMET Recent Labs    10/26/19 0645 10/27/19 0230 10/28/19 0503  NA 139 136 136  K 4.7 4.5 4.0  CL 105 103 101  CO2 24 24 25   GLUCOSE 91 111* 104*  BUN 61* 66* 70*  CREATININE 3.25* 3.67* 4.01*  CALCIUM 9.0 9.0 9.0   LFT Recent Labs    10/27/19 0230  PROT 5.0*  ALBUMIN 2.4*  AST 14*  ALT 8  ALKPHOS 68  BILITOT 1.7*   PT/INR Recent Labs    10/27/19 0230  LABPROT 22.0*  INR 2.0*   Hepatitis Panel No results for input(s): HEPBSAG, HCVAB, HEPAIGM, HEPBIGM in the last 72 hours. C-Diff No results for input(s): CDIFFTOX in the last 72 hours. No results for input(s): CDIFFPCR in the last 72 hours.   Studies/Results: CT ABDOMEN PELVIS WO CONTRAST  Result Date: 10/27/2019 CLINICAL DATA:  64 year old female with abdominal pain. Concern for peritonitis. EXAM: CT ABDOMEN AND PELVIS WITHOUT CONTRAST TECHNIQUE: Multidetector CT imaging of the abdomen and pelvis was performed following the standard protocol without IV contrast. COMPARISON:  CT abdomen pelvis dated 08/15/2019. FINDINGS: Evaluation of this exam is limited in the absence of intravenous contrast. Lower chest: Partially visualized small bilateral pleural effusions. There is partial consolidative changes of the right lower lobe and majority of the visualized left lower lobe which may represent atelectasis or pneumonia. Clinical correlation is recommended. There is moderate cardiomegaly. Coronary vascular calcifications noted. There is hypoattenuation of the cardiac blood pool suggestive of anemia. Clinical correlation is recommended. Moderate ascites, significantly increased since the prior CT. Hepatobiliary: Slight irregularity of the liver contour may represent early  changes of cirrhosis. Clinical correlation is recommended. No intrahepatic biliary ductal dilatation. The gallbladder is predominantly contracted. Pancreas: The pancreas is grossly unremarkable. Spleen: Normal in size without focal abnormality. Adrenals/Urinary Tract: The adrenal glands are unremarkable. There is no hydronephrosis or nephrolithiasis on either side. The urinary bladder is decompressed around a Foley catheter. Stomach/Bowel: There is air within the wall of the gastric fundus consistent with pneumatosis and most concerning for bowel ischemia. Two small pockets of air in the periportal region and along the falciform ligament (35/2) most concerning for extraluminal air, possibly intravascular. Correlation with clinical exam and lactic acid levels recommended. Mildly dilated small bowel loops measure up to 4 cm in caliber. The distal small bowel are collapsed. A transition may be present in the midline anterior abdomen (63/2. Evaluation of the bowel is very limited due to anasarca. There is sigmoid diverticulosis without active inflammatory changes. The appendix is not identified. Vascular/Lymphatic: Mild aortoiliac atherosclerotic disease. The IVC is unremarkable. Right femoral central venous line with tip in the distal IVC. Reproductive: Hysterectomy. Other: Severe diffuse subcutaneous edema and anasarca. Musculoskeletal: Degenerative changes of the spine. No acute osseous pathology. IMPRESSION: 1. Findings most concerning for gastric pneumatosis with small pockets of air likely in the portal vein suggestive of bowel ischemia. Correlation with clinical exam and lactic acid levels recommended. 2. Findings of bowel obstruction with transition in the midline abdomen. 3. Sigmoid diverticulosis. 4. Moderate ascites, significantly increased since the prior CT. 5. Severe diffuse subcutaneous edema and anasarca. 6. Partially visualized small bilateral pleural effusions with partial consolidative changes of the  visualized lower lobes which may represent atelectasis or pneumonia. Clinical correlation is recommended. 7. Aortic Atherosclerosis (ICD10-I70.0). These results were called by telephone at the time of interpretation on 10/27/2019 at 12:52 am to Nurse Dossie Der, who verbally acknowledged these results. Electronically Signed   By: Anner Crete M.D.   On: 10/27/2019 01:05    Scheduled Inpatient Medications:   . Chlorhexidine Gluconate Cloth  6 each Topical Daily  . furosemide  60 mg Intravenous BID  . mouth rinse  15 mL Mouth Rinse BID  . pantoprazole (PROTONIX) IV  40 mg Intravenous BID  . polyethylene glycol  17 g Oral Daily  . sodium chloride flush  3 mL Intravenous Q12H    Continuous Inpatient Infusions:   . sodium chloride Stopped (10/26/19 1410)  . azithromycin Stopped (10/27/19 1741)  . cefTRIAXone (ROCEPHIN)  IV 2 g (10/28/19 1000)  . metronidazole 500 mg (10/28/19 1827)    PRN Inpatient Medications:  sodium chloride, acetaminophen, alum & mag hydroxide-simeth **AND** lidocaine, bisacodyl, Glycerin (Adult), HYDROmorphone (DILAUDID) injection, midodrine, ondansetron (ZOFRAN) IV, ondansetron (ZOFRAN) IV, sodium chloride flush  Assessment:  1. Ascites - Fluid analysis consistent with SBP. CT images yesterday morning reviewed. General Surgery has been consulted and appreciate recommendations. No urgent surgical intervention advised.  2. UGI bleed - historical - Hgb stable at 10.0.Marland KitchenNo hemetemesis or melena.  3. Hypotension - Relative to renal failure, cirrhosis, now concerned about sepsis given peritoneal fluid analysis.  4. CHF.  5. Polysubtance abuse.   Plan: 1. Cultures negative to date. Gram stain showing abundant WBC, mostly PMNs consistent with SBP 2. Continue IV antibiotics 3. Plan for repeat paracentesiswith fluid analysis to help assess response to IV antibiotics and help to guide therapy 4. Monitor H/H.  5. Following along with you   Please  call with questions or concerns.    Malon Siddall K. Alice Reichert, M.D. 10/28/2019, 9:08 PM

## 2019-10-28 NOTE — Progress Notes (Signed)
[facility]  ROUNDING NOTE   Subjective:   Katelyn Lane admitted on 10/19/2019 for Peripheral edema [R60.9] Acute CHF (congestive heart failure) ([place]) [I50.9] AKI (acute kidney injury) ([place]) [N17.9] Congestive heart failure, unspecified HF chronicity, unspecified heart failure type ([place]) [I50.9]  Patient resting in bed, appears in no acute distress. She reports as 'feeling much better'.  Patient received UF only dialysis session on 10/26/19 with 1.5 L fluid removal. Dialysis was held yesterday as patient was clinically improving. IV furosemide 60 mg BID started. Patient stays stable today, denies worsening SOB,nausea or vomiting.She has a urine output of 2,775 ml for the preceding 24 hours.We will hold dialysis again today and will reassess her tomorrow.   Objective:  Vital signs in last 24 hours:  Temp:  [97.7 F (36.5 C)-98.4 F (36.9 C)] 97.8 F (36.6 C) (10/20 1330) Pulse Rate:  [68-88] 68 (10/20 1510) Resp:  [14-23] 14 (10/20 1510) BP: (86-104)/(47-74) 93/61 (10/20 1510) SpO2:  [93 %-100 %] 96 % (10/20 1510) Weight:  [108.3 kg] 108.3 kg (10/20 0334)  Weight change: 0.074 kg Filed Weights   10/26/19 2310 10/27/19 0519 10/28/19 0334  Weight: 108.2 kg 108.4 kg 108.3 kg    Intake/Output: I/O last 3 completed shifts: In: 2198.5 [P.O.:1340; I.V.:3; IV Piggyback:855.5] Out: 9935 [Urine:3340]   Intake/Output this shift:  Total I/O In: -  Out: 900 [Urine:900]  Physical Exam: General:  Sitting up in bed, in no acute distress  Head  Normocephalic, atraumatic,Katelyn Lane in place  Neck: Supple  Lungs:   Normal effort,O2 2L via nasal canula, Lungs diminished at the bases  Heart:  S1S2, regular rate and rhythm  Abdomen:   Non tender, Lower Abdominal swelling +  Extremities: 2+ pitting edema on bilateral lower extremities  Neurologic:  Alert,awake,speech clear and appropriate  Skin: No acute lesions or rashes  Rt Femoral Catheter  Basic Metabolic Panel: Recent  Labs  Lab 10/25/19 0018 10/25/19 0018 10/25/19 0421 10/25/19 0421 10/25/19 1119 10/26/19 0645 10/27/19 0230 10/28/19 0503  NA 138  --  138  --   --  139 136 136  K 5.4*  --  5.0  --   --  4.7 4.5 4.0  CL 105  --  105  --   --  105 103 101  CO2 23  --  22  --   --  24 24 25   GLUCOSE 124*  --  119*  --   --  91 111* 104*  BUN 61*  --  61*  --   --  61* 66* 70*  CREATININE 3.27*  --  3.25*  --   --  3.25* 3.67* 4.01*  CALCIUM 9.5   < > 9.2   < >  --  9.0 9.0 9.0  MG 3.0*  --   --   --   --   --   --   --   PHOS  --   --   --   --  4.2  --   --   --    < > = values in this interval not displayed.    Liver Function Tests: Recent Labs  Lab 10/23/19 0450 10/24/19 0545 10/25/19 0421 10/26/19 0645 10/27/19 0230  AST 23 25 17 16  14*  ALT 15 14 10 8 8   ALKPHOS 119 113 88 73 68  BILITOT 1.4* 2.3* 2.2* 2.2* 1.7*  PROT 6.0* 7.0 6.1* 5.4* 5.0*  ALBUMIN 2.5*  2.5* 3.2* 3.0* 2.6* 2.4*  Recent Labs  Lab 10/23/19 0450 10/24/19 0545  LIPASE 79* 55*   No results for input(s): AMMONIA in the last 168 hours.  CBC: Recent Labs  Lab 10/25/19 0018 10/25/19 0421 10/26/19 0645 10/27/19 0230 10/28/19 0503  WBC 10.5 10.5 10.7* 10.3 7.7  HGB 13.4 13.2 11.4* 10.3* 10.0*  HCT 42.3 41.6 34.8* 31.4* 30.4*  MCV 75.9* 74.4* 72.7* 73.0* 71.0*  PLT 307 302 260 245 222    Cardiac Enzymes: No results for input(s): CKTOTAL, CKMB, CKMBINDEX, TROPONINI in the last 168 hours.  BNP: Invalid input(s): POCBNP  CBG: No results for input(s): GLUCAP in the last 168 hours.  Microbiology: Results for orders placed or performed during the hospital encounter of 10/19/19  Respiratory Panel by RT PCR (Flu A&B, Covid) - Nasopharyngeal Swab     Status: None   Collection Time: 10/19/19 11:13 PM   Specimen: Nasopharyngeal Swab  Result Value Ref Range Status   SARS Coronavirus 2 by RT PCR NEGATIVE NEGATIVE Final    Comment: (NOTE) SARS-CoV-2 target nucleic acids are NOT DETECTED.  The SARS-CoV-2  RNA is generally detectable in upper respiratoy specimens during the acute phase of infection. The lowest concentration of SARS-CoV-2 viral copies this assay can detect is 131 copies/mL. A negative result does not preclude SARS-Cov-2 infection and should not be used as the sole basis for treatment or other patient management decisions. A negative result may occur with  improper specimen collection/handling, submission of specimen other than nasopharyngeal swab, presence of viral mutation(s) within the areas targeted by this assay, and inadequate number of viral copies (<131 copies/mL). A negative result must be combined with clinical observations, patient history, and epidemiological information. The expected result is Negative.  Fact Sheet for Patients:  PinkCheek.be  Fact Sheet for Healthcare Providers:  GravelBags.it  This test is no t yet approved or cleared by the [place] FDA and  has been authorized for detection and/or diagnosis of SARS-CoV-2 by FDA under an Emergency Use Authorization (EUA). This EUA will remain  in effect (meaning this test can be used) for the duration of the COVID-19 declaration under Section 564(b)(1) of the Act, 21 U.S.C. section 360bbb-3(b)(1), unless the authorization is terminated or revoked sooner.     Influenza A by PCR NEGATIVE NEGATIVE Final   Influenza B by PCR NEGATIVE NEGATIVE Final    Comment: (NOTE) The Xpert Xpress SARS-CoV-2/FLU/RSV assay is intended as an aid in  the diagnosis of influenza from Nasopharyngeal swab specimens and  should not be used as a sole basis for treatment. Nasal washings and  aspirates are unacceptable for Xpert Xpress SARS-CoV-2/FLU/RSV  testing.  Fact Sheet for Patients: PinkCheek.be  Fact Sheet for Healthcare Providers: GravelBags.it  This test is not yet approved or cleared by the  [place] FDA and  has been authorized for detection and/or diagnosis of SARS-CoV-2 by  FDA under an Emergency Use Authorization (EUA). This EUA will remain  in effect (meaning this test can be used) for the duration of the  Covid-19 declaration under Section 564(b)(1) of the Act, 21  U.S.C. section 360bbb-3(b)(1), unless the authorization is  terminated or revoked. Performed at [facility], [address] [place], [place] [address]   MRSA PCR Screening     Status: None   Collection Time: 10/25/19  2:50 PM   Specimen: Nasopharyngeal  Result Value Ref Range Status   MRSA by PCR NEGATIVE NEGATIVE Final    Comment:        The  GeneXpert MRSA Assay (FDA approved for NASAL specimens only), is one component of a comprehensive MRSA colonization surveillance program. It is not intended to diagnose MRSA infection nor to guide or monitor treatment for MRSA infections. Performed at [facility], [address] [place], [place] [address]   CULTURE, BLOOD (ROUTINE X 2) w Reflex to ID Panel     Status: None (Preliminary result)   Collection Time: 10/25/19  4:29 PM   Specimen: BLOOD  Result Value Ref Range Status   Specimen Description BLOOD CENTRAL LINE  Final   Special Requests   Final    BOTTLES DRAWN AEROBIC AND ANAEROBIC Blood Culture adequate volume   Culture   Final    NO GROWTH 3 DAYS Performed at [facility], [address] [place], [place] [address]    Report Status PENDING  Incomplete  CULTURE, BLOOD (ROUTINE X 2) w Reflex to ID Panel     Status: None (Preliminary result)   Collection Time: 10/26/19  6:45 AM   Specimen: BLOOD  Result Value Ref Range Status   Specimen Description BLOOD BLOOD LEFT HAND  Final   Special Requests   Final    BOTTLES DRAWN AEROBIC AND ANAEROBIC Blood Culture adequate volume   Culture   Final    NO GROWTH 2 DAYS Performed at [facility], [address], [place], [place] [address]    Report Status  PENDING  Incomplete  Body fluid culture     Status: None (Preliminary result)   Collection Time: 10/26/19 11:30 AM   Specimen: Fluid  Result Value Ref Range Status   Specimen Description   Final    FLUID Performed at [facility], [address] [place], [place] [address]    Special Requests   Final    PERITONEAL Performed at [facility], [address] [place], [place] [address]    Gram Stain   Final    ABUNDANT WBC PRESENT, PREDOMINANTLY PMN NO ORGANISMS SEEN    Culture   Final    NO GROWTH 2 DAYS Performed at [facility], [place] [address] [place],  [address]    Report Status PENDING  Incomplete    Coagulation Studies: Recent Labs    10/27/19 0230  LABPROT 22.0*  INR 2.0*    Urinalysis: No results for input(s): COLORURINE, LABSPEC, PHURINE, GLUCOSEU, HGBUR, BILIRUBINUR, KETONESUR, PROTEINUR, UROBILINOGEN, NITRITE, LEUKOCYTESUR in the last 72 hours.  Invalid input(s): APPERANCEUR    Imaging: CT ABDOMEN PELVIS WO CONTRAST  Result Date: 10/27/2019 CLINICAL DATA:  64 year old female with abdominal pain. Concern for peritonitis. EXAM: CT ABDOMEN AND PELVIS WITHOUT CONTRAST TECHNIQUE: Multidetector CT imaging of the abdomen and pelvis was performed following the standard protocol without IV contrast. COMPARISON:  CT abdomen pelvis dated 08/15/2019. FINDINGS: Evaluation of this exam is limited in the absence of intravenous contrast. Lower chest: Partially visualized small bilateral pleural effusions. There is partial consolidative changes of the right lower lobe and majority of the visualized left lower lobe which may represent atelectasis or pneumonia. Clinical correlation is recommended. There is moderate cardiomegaly. Coronary vascular calcifications noted. There is hypoattenuation of the cardiac blood pool suggestive of anemia. Clinical correlation is recommended. Moderate ascites, significantly increased since the prior CT. Hepatobiliary:  Slight irregularity of the liver contour may represent early changes of cirrhosis. Clinical correlation is recommended. No intrahepatic biliary ductal dilatation. The gallbladder is predominantly contracted. Pancreas: The pancreas is grossly unremarkable. Spleen: Normal in size without focal abnormality.  Adrenals/Urinary Tract: The adrenal glands are unremarkable. There is no hydronephrosis or nephrolithiasis on either side. The urinary bladder is decompressed around a Foley catheter. Stomach/Bowel: There is air within the wall of the gastric fundus consistent with pneumatosis and most concerning for bowel ischemia. Two small pockets of air in the periportal region and along the falciform ligament (35/2) most concerning for extraluminal air, possibly intravascular. Correlation with clinical exam and lactic acid levels recommended. Mildly dilated small bowel loops measure up to 4 cm in caliber. The distal small bowel are collapsed. A transition may be present in the midline anterior abdomen (63/2. Evaluation of the bowel is very limited due to anasarca. There is sigmoid diverticulosis without active inflammatory changes. The appendix is not identified. Vascular/Lymphatic: Mild aortoiliac atherosclerotic disease. The IVC is unremarkable. Right femoral central venous line with tip in the distal IVC. Reproductive: Hysterectomy. Other: Severe diffuse subcutaneous edema and anasarca. Musculoskeletal: Degenerative changes of the spine. No acute osseous pathology. IMPRESSION: 1. Findings most concerning for gastric pneumatosis with small pockets of air likely in the portal vein suggestive of bowel ischemia. Correlation with clinical exam and lactic acid levels recommended. 2. Findings of bowel obstruction with transition in the midline abdomen. 3. Sigmoid diverticulosis. 4. Moderate ascites, significantly increased since the prior CT. 5. Severe diffuse subcutaneous edema and anasarca. 6. Partially visualized small bilateral  pleural effusions with partial consolidative changes of the visualized lower lobes which may represent atelectasis or pneumonia. Clinical correlation is recommended. 7. Aortic Atherosclerosis (ICD10-I70.0). These results were called by telephone at the time of interpretation on 10/27/2019 at 12:52 am to Nurse Dossie Der, who verbally acknowledged these results. Electronically Signed   By: Anner Crete M.D.   On: 10/27/2019 01:05   DG Abd 1 View  Result Date: 10/26/2019 CLINICAL DATA:  Abdominal pain and recent paracentesis EXAM: ABDOMEN - 1 VIEW COMPARISON:  10/24/2019 FINDINGS: Scattered large and small bowel gas is noted although predominately centrally which suggests some persistent ascites despite recent paracentesis. Dialysis catheter is noted in the right femoral vein extending into the IVC. This is new from the prior exam. No bony abnormality is seen. No obstructive changes are noted. IMPRESSION: Changes suggestive of mild residual ascites. No other focal acute abnormality is noted. Electronically Signed   By: Inez Catalina M.D.   On: 10/26/2019 18:59     Medications:   . [MAR Hold] sodium chloride Stopped (10/26/19 1410)  . sodium chloride    . [MAR Hold] azithromycin Stopped (10/27/19 1741)  . [MAR Hold] cefTRIAXone (ROCEPHIN)  IV 2 g (10/28/19 1000)  . [MAR Hold] metronidazole 500 mg (10/28/19 1207)   . [MAR Hold] Chlorhexidine Gluconate Cloth  6 each Topical Daily  . fentaNYL      . [MAR Hold] furosemide  60 mg Intravenous BID  . heparin sodium (porcine)      . [MAR Hold] mouth rinse  15 mL Mouth Rinse BID  . midazolam      . [MAR Hold] pantoprazole (PROTONIX) IV  40 mg Intravenous BID  . [MAR Hold] polyethylene glycol  17 g Oral Daily  . [MAR Hold] sodium chloride flush  3 mL Intravenous Q12H   [MAR Hold] sodium chloride, [MAR Hold] acetaminophen, [MAR Hold] alum & mag hydroxide-simeth **AND** [MAR Hold] lidocaine, [MAR Hold] bisacodyl, diphenhydrAMINE, famotidine,  fentaNYL, [MAR Hold] Glycerin (Adult), HYDROmorphone (DILAUDID) injection, methylPREDNISolone (SOLU-MEDROL) injection, midazolam, midazolam, [MAR Hold] midodrine, [MAR Hold] ondansetron (ZOFRAN) IV, ondansetron (ZOFRAN) IV, [MAR Hold] sodium chloride flush  Assessment/ Plan:  Katelyn Lane is a 64 y.o. black female with congestive heart failure, hypertension, hyperlipidemia, gout, COPD, anemia, cocaine abuse who is admitted to [facility] on 10/19/2019 for Peripheral edema [R60.9] Acute CHF (congestive heart failure) ([place]) [I50.9] AKI (acute kidney injury) ([place]) [N17.9] Congestive heart failure, unspecified HF chronicity, unspecified heart failure type ([place]) [I50.9]  1. Acute kidney injury on chronic kidney disease stage IIIB. Baseline creatinine of 1.66, GFR of 32. With history of proteinuria.  Chronic kidney disease secondary to hypertension  Acute renal failure secondary to acute cardiorenal syndrome  -Patient received  Dialysis with UF only session on 10/26/19 , with 1.5L fluid removal. Lab Results  Component Value Date   CREATININE 4.01 (H) 10/28/2019   CREATININE 3.67 (H) 10/27/2019   CREATININE 3.25 (H) 10/26/2019   -F/C draining clear yellow output,total output for the preceding 24 hours is 2,775 ml. -No dialysis today -Continue Lasix 60 mg BID -Will reassess her tomorrow    2. Hypotension with acute exacerbation of systolic and diastolic congestive heart failure. Echocardiogram 06/22/19 EF of 25-30%.  -on 3 L nasal cannula -Blood pressure readings stays soft -Patient is on Midodrine 2.5 mg TID -EF 25% to 30%   3. Ascites/ Bacterial Peritonitis Patient is on IV antibiotics,Rocephin and Flagyl Gastroenterology team managing       LOS: 9 Katelyn [address] 10/20/20213:17 PM

## 2019-10-28 NOTE — Op Note (Signed)
OPERATIVE NOTE   PROCEDURE: 1. Insertion of tunneled dialysis catheter right IJ approach with ultrasound and fluoroscopic guidance.  PRE-OPERATIVE DIAGNOSIS: Acute on chronic renal insufficiency  POST-OPERATIVE DIAGNOSIS: Same  SURGEON: Hortencia Pilar.  ANESTHESIA: Conscious sedation was administered under my direct supervision by the interventional radiology RN. IV Versed plus fentanyl were utilized. Continuous ECG, pulse oximetry and blood pressure was monitored throughout the entire procedure. Conscious sedation was for a total of 38 minutes 9 seconds.  ESTIMATED BLOOD LOSS: Minimal cc  CONTRAST USED:  None  FLUOROSCOPY TIME: 0.4 minutes  INDICATIONS:   Katelyn Lane a 64 y.o. y.o. female who presents with acute on chronic renal insufficiency.  She now requires hemodialysis.  Risks and benefits for catheter insertion have been reviewed all questions answered patient has agreed to proceed  DESCRIPTION: After obtaining full informed written consent, the patient was positioned supine. The right neck and chest wall was prepped and draped in a sterile fashion. Ultrasound was placed in a sterile sleeve. Ultrasound was utilized to identify the right internal jugular vein vein which is noted to be echolucent and compressible indicating patency. Image is recorded for the permanent record. Under direct ultrasound visualization a micro-needle is inserted into the vein followed by the micro-wire. Micro-sheath was then advanced and a J wire is inserted without difficulty under fluoroscopic guidance. Small counterincision was made at the wire insertion site. Dilators are passed over the wire.  The exit site is selected assault small incision is made and the catheter and tunneling device are pulled subcutaneously. The tunneled dialysis catheter is fed into the central venous system without difficulty.  Both lumens aspirate and flush easily. After verification of smooth contour with proper tip  position under fluoroscopy the catheter is packed with 5000 units of heparin per lumen.  Catheter secured to the skin of the right chest wall with 0 silk. A sterile dressing is applied with a Biopatch.  COMPLICATIONS: None  CONDITION: Good  Hortencia Pilar [facility]. Office:  [phone]   10/28/2019,3:35 PM

## 2019-10-28 NOTE — Progress Notes (Signed)
   10/28/19 1200  Clinical Encounter Type  Visited With Patient  Visit Type Initial  Referral From Chaplain  Consult/Referral To Chaplain  Chaplain stopped twice to visit with Pt but she was sleep. Chaplain will follow up later.

## 2019-10-28 NOTE — Progress Notes (Signed)
Mobility Specialist - Progress Note   10/28/19 1305  Mobility  Activity Refused mobility  Mobility performed by Mobility specialist    Pt refused session at this time. No reason specified. Will re-attempt at a later date/time.    Katelyn Lane Mobility Specialist  10/28/19, 1:06 PM

## 2019-10-28 NOTE — Progress Notes (Signed)
PROGRESS NOTE    Katelyn Lane  OYD:[id number] DOB: 01-Feb-1955 DOA: 10/19/2019 PCP: [name], MD   Chief complaint.  Abdominal pain. 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 Found to have BNP greater than 4500, elevated troponin and AKI.  Multiple prior admissions with similar symptoms and complaints.  Prolonged hospitalization in August 2021 when she was discharged to SNF.  Not clear when she get out of SNF.  Continues to complain that she is homeless but per social worker she do have a house.  Not take her medication despite being provided with medical management.  After admission to the hospital, she was diagnosed with acute on chronic combined systolic and diastolic congestive heart failure with ejection fraction 25 to 30%.  She was not responding to IV Lasix.  She was seen by nephrology, temporary dialysis catheter was performed and she was dialyzed on 10/17 and 10/18. Patient also complaining of abdominal pain, she had a paracentesis, study showed possible spontaneous peritonitis, she was treated with Rocephin and Flagyl.  Patient had a worsening respiratory status on 10/17, was diagnosed with aspiration pneumonia.  She was treated with Rocephin, Zithromax and Flagyl.   Assessment & Plan:   Principal Problem:   Acute CHF (congestive heart failure) (HCC) Active Problems:   AKI (acute kidney injury) ([place])   Crack cocaine use   Acute respiratory failure with hypoxia (HCC)   LV (left ventricular) mural thrombus   Cocaine abuse ([place])   Peripheral edema   Cirrhosis of liver ([place])  #1.  Spontaneous bacterial peritonitis. Continue antibiotics with Rocephin and Flagyl. Appreciate  GI consult. GI is planning for repeat paracentesis today.  #2.  Possible upper GI bleed. Patient also had episode of dark-colored emesis.  Based on GI evaluation, no EGD is planned.  However, patient is covered with octreotide.  Continue to follow hemoglobin.  3.  Acute on chronic combined systolic and diastolic congestive heart failure. Acute hypoxemic respite failure. Patient had ejection fraction 25 to 30% and chronic diastolic dysfunction on echocardiogram.  Patient failed diuretic trial.  Received a dialysis. Followed by nephrology.  4.  Aspiration pneumonia. Condition improving.  Has been treated with Rocephin, Zithromax and Flagyl.  Will cover for 5 days.  5.  Liver cirrhosis with ascites. Followed by GI.  6.  COPD. No bronchospasm.  7.  History of left ventricular thrombus and history of CVA. Xarelto on hold for concern of GI bleed.   DVT prophylaxis: SCDs Code Status: Full Family Communication: None .   Status is: Inpatient  Remains inpatient appropriate because:Inpatient level of care appropriate due to severity of illness   Dispo: The patient is from: Home              Anticipated d/c is to: SNF              Anticipated d/c date is: 2 days              Patient currently is not medically stable to d/c.        I/O last 3 completed shifts: In: 2198.5 [P.O.:1340; I.V.:3; IV Piggyback:855.5] Out: 6720 [Urine:3340] Total I/O In: -  Out: 900 [Urine:900]     Consultants:   GI, Nephrology, cardiology  Procedures: Paracentesis  Antimicrobials: Rocephin/Zithromax/Flagyl  Subjective:  Patient feels better today, she still have some intermittent abdominal pain, no nausea vomiting. No fever or chills. She does not feel any short of breath.  No cough. No headache or dizziness. She still making urine, no dysuria hematuria.  Objective: Vitals:   10/27/19 1926 10/28/19 0334 10/28/19 0828 10/28/19 1210  BP: 104/74 95/69 (!) 90/47 91/60  Pulse: 88 86 83  72  Resp: 18 16 18 18   Temp: 97.9 F (36.6 C) 97.7 F (36.5 C) 97.7 F (36.5 C) 98.4 F (36.9 C)  TempSrc: Oral Oral    SpO2: 100% 100% 97% 99%  Weight:  108.3 kg    Height:        Intake/Output Summary (Last 24 hours) at 10/28/2019 1339 Last data filed at 10/28/2019 1211 Gross per 24 hour  Intake 695.45 ml  Output 3275 ml  Net -2579.55 ml   Filed Weights   10/26/19 2310 10/27/19 0519 10/28/19 0334  Weight: 108.2 kg 108.4 kg 108.3 kg    Examination:  General exam: Appears calm and comfortable  Respiratory system: Clear to auscultation. Respiratory effort normal. Cardiovascular system: S1 & S2 heard, RRR. No JVD, murmurs, rubs, gallops or clicks. No pedal edema. Gastrointestinal system: Abdomen is nondistended, soft and nontender. No organomegaly or masses felt. Normal bowel sounds heard. Central nervous system: Alert and oriented x2. No focal neurological deficits. Extremities: Symmetric  Skin: No rashes, lesions or ulcers     Data Reviewed: I have personally reviewed following labs and imaging studies  CBC: Recent Labs  Lab 10/25/19 0018 10/25/19 0421 10/26/19 0645 10/27/19 0230 10/28/19 0503  WBC 10.5 10.5 10.7* 10.3 7.7  HGB 13.4 13.2 11.4* 10.3* 10.0*  HCT 42.3 41.6 34.8* 31.4* 30.4*  MCV 75.9* 74.4* 72.7* 73.0* 71.0*  PLT 307 302 260 245 482   Basic Metabolic Panel: Recent Labs  Lab 10/25/19 0018 10/25/19 0421 10/25/19 1119 10/26/19 0645 10/27/19 0230 10/28/19 0503  NA 138 138  --  139 136 136  K 5.4* 5.0  --  4.7 4.5 4.0  CL 105 105  --  105 103 101  CO2 23 22  --  24 24 25   GLUCOSE 124* 119*  --  91 111* 104*  BUN 61* 61*  --  61* 66* 70*  CREATININE 3.27* 3.25*  --  3.25* 3.67* 4.01*  CALCIUM 9.5 9.2  --  9.0 9.0 9.0  MG 3.0*  --   --   --   --   --   PHOS  --   --  4.2  --   --   --    GFR: Estimated Creatinine Clearance: 17.9 mL/min (A) (by C-G formula based on SCr of 4.01 mg/dL (H)). Liver Function Tests: Recent Labs  Lab  10/23/19 0450 10/24/19 0545 10/25/19 0421 10/26/19 0645 10/27/19 0230  AST 23 25 17 16  14*  ALT 15 14 10 8 8   ALKPHOS 119 113 88 73 68  BILITOT 1.4* 2.3* 2.2* 2.2* 1.7*  PROT 6.0* 7.0 6.1* 5.4* 5.0*  ALBUMIN 2.5*  2.5* 3.2* 3.0* 2.6* 2.4*   Recent Labs  Lab 10/23/19 0450 10/24/19 0545  LIPASE 79* 55*   No results for input(s): AMMONIA in the last 168 hours. Coagulation Profile: Recent Labs  Lab 10/27/19 0230  INR 2.0*   Cardiac Enzymes: No results for input(s): CKTOTAL, CKMB, CKMBINDEX, TROPONINI in the last 168 hours. BNP (last 3 results) No results for input(s): PROBNP in the last 8760 hours. HbA1C: No results for input(s):  HGBA1C in the last 72 hours. CBG: No results for input(s): GLUCAP in the last 168 hours. Lipid Profile: No results for input(s): CHOL, HDL, LDLCALC, TRIG, CHOLHDL, LDLDIRECT in the last 72 hours. Thyroid Function Tests: No results for input(s): TSH, T4TOTAL, FREET4, T3FREE, THYROIDAB in the last 72 hours. Anemia Panel: No results for input(s): VITAMINB12, FOLATE, FERRITIN, TIBC, IRON, RETICCTPCT in the last 72 hours. Sepsis Labs: Recent Labs  Lab 10/25/19 0421 10/27/19 0320  PROCALCITON 14.74  --   LATICACIDVEN  --  1.0    Recent Results (from the past 240 hour(s))  Respiratory Panel by RT PCR (Flu A&B, Covid) - Nasopharyngeal Swab     Status: None   Collection Time: 10/19/19 11:13 PM   Specimen: Nasopharyngeal Swab  Result Value Ref Range Status   SARS Coronavirus 2 by RT PCR NEGATIVE NEGATIVE Final    Comment: (NOTE) SARS-CoV-2 target nucleic acids are NOT DETECTED.  The SARS-CoV-2 RNA is generally detectable in upper respiratoy specimens during the acute phase of infection. The lowest concentration of SARS-CoV-2 viral copies this assay can detect is 131 copies/mL. A negative result does not preclude SARS-Cov-2 infection and should not be used as the sole basis for treatment or other patient management decisions. A negative  result may occur with  improper specimen collection/handling, submission of specimen other than nasopharyngeal swab, presence of viral mutation(s) within the areas targeted by this assay, and inadequate number of viral copies (<131 copies/mL). A negative result must be combined with clinical observations, patient history, and epidemiological information. The expected result is Negative.  Fact Sheet for Patients:  PinkCheek.be  Fact Sheet for Healthcare Providers:  GravelBags.it  This test is no t yet approved or cleared by the [place] FDA and  has been authorized for detection and/or diagnosis of SARS-CoV-2 by FDA under an Emergency Use Authorization (EUA). This EUA will remain  in effect (meaning this test can be used) for the duration of the COVID-19 declaration under Section 564(b)(1) of the Act, 21 U.S.C. section 360bbb-3(b)(1), unless the authorization is terminated or revoked sooner.     Influenza A by PCR NEGATIVE NEGATIVE Final   Influenza B by PCR NEGATIVE NEGATIVE Final    Comment: (NOTE) The Xpert Xpress SARS-CoV-2/FLU/RSV assay is intended as an aid in  the diagnosis of influenza from Nasopharyngeal swab specimens and  should not be used as a sole basis for treatment. Nasal washings and  aspirates are unacceptable for Xpert Xpress SARS-CoV-2/FLU/RSV  testing.  Fact Sheet for Patients: PinkCheek.be  Fact Sheet for Healthcare Providers: GravelBags.it  This test is not yet approved or cleared by the [place] FDA and  has been authorized for detection and/or diagnosis of SARS-CoV-2 by  FDA under an Emergency Use Authorization (EUA). This EUA will remain  in effect (meaning this test can be used) for the duration of the  Covid-19 declaration under Section 564(b)(1) of the Act, 21  U.S.C. section 360bbb-3(b)(1), unless the authorization is   terminated or revoked. Performed at [facility], [address] [place], [place] [address]   MRSA PCR Screening     Status: None   Collection Time: 10/25/19  2:50 PM   Specimen: Nasopharyngeal  Result Value Ref Range Status   MRSA by PCR NEGATIVE NEGATIVE Final    Comment:        The GeneXpert MRSA Assay (FDA approved for NASAL specimens only), is one component of a comprehensive MRSA colonization surveillance program. It is not intended to  diagnose MRSA infection nor to guide or monitor treatment for MRSA infections. Performed at [facility], [address] [place], [place] [address]   CULTURE, BLOOD (ROUTINE X 2) w Reflex to ID Panel     Status: None (Preliminary result)   Collection Time: 10/25/19  4:29 PM   Specimen: BLOOD  Result Value Ref Range Status   Specimen Description BLOOD CENTRAL LINE  Final   Special Requests   Final    BOTTLES DRAWN AEROBIC AND ANAEROBIC Blood Culture adequate volume   Culture   Final    NO GROWTH 3 DAYS Performed at [facility], [address] [place], [place] [address]    Report Status PENDING  Incomplete  CULTURE, BLOOD (ROUTINE X 2) w Reflex to ID Panel     Status: None (Preliminary result)   Collection Time: 10/26/19  6:45 AM   Specimen: BLOOD  Result Value Ref Range Status   Specimen Description BLOOD BLOOD LEFT HAND  Final   Special Requests   Final    BOTTLES DRAWN AEROBIC AND ANAEROBIC Blood Culture adequate volume   Culture   Final    NO GROWTH 2 DAYS Performed at [facility], [address], [place], [place] [address]    Report Status PENDING  Incomplete  Body fluid culture     Status: None (Preliminary result)   Collection Time: 10/26/19 11:30 AM   Specimen: Fluid  Result Value Ref Range Status   Specimen Description   Final    FLUID Performed at [facility], [address] [place], [place] [address]    Special Requests   Final    PERITONEAL Performed at  [facility], [address] [place], [place] [address]    Gram Stain   Final    ABUNDANT WBC PRESENT, PREDOMINANTLY PMN NO ORGANISMS SEEN    Culture   Final    NO GROWTH 2 DAYS Performed at [facility], [address], [place],  [address]    Report Status PENDING  Incomplete         Radiology Studies: CT ABDOMEN PELVIS WO CONTRAST  Result Date: 10/27/2019 CLINICAL DATA:  64 year old female with abdominal pain. Concern for peritonitis. EXAM: CT ABDOMEN AND PELVIS WITHOUT CONTRAST TECHNIQUE: Multidetector CT imaging of the abdomen and pelvis was performed following the standard protocol without IV contrast. COMPARISON:  CT abdomen pelvis dated 08/15/2019. FINDINGS: Evaluation of this exam is limited in the absence of intravenous contrast. Lower chest: Partially visualized small bilateral pleural effusions. There is partial consolidative changes of the right lower lobe and majority of the visualized left lower lobe which may represent atelectasis or pneumonia. Clinical correlation is recommended. There is moderate cardiomegaly. Coronary vascular calcifications noted. There is hypoattenuation of the cardiac blood pool suggestive of anemia. Clinical correlation is recommended. Moderate ascites, significantly increased since the prior CT. Hepatobiliary: Slight irregularity of the liver contour may represent early changes of cirrhosis. Clinical correlation is recommended. No intrahepatic biliary ductal dilatation. The gallbladder is predominantly contracted. Pancreas: The pancreas is grossly unremarkable. Spleen: Normal in size without focal abnormality. Adrenals/Urinary Tract: The adrenal glands are unremarkable. There is no hydronephrosis or nephrolithiasis on either side. The urinary bladder is decompressed around a Foley catheter. Stomach/Bowel: There is air within the wall of the gastric fundus consistent with pneumatosis and most concerning for bowel ischemia. Two small  pockets of air in the periportal region and along the falciform ligament (35/2) most concerning for extraluminal  air, possibly intravascular. Correlation with clinical exam and lactic acid levels recommended. Mildly dilated small bowel loops measure up to 4 cm in caliber. The distal small bowel are collapsed. A transition may be present in the midline anterior abdomen (63/2. Evaluation of the bowel is very limited due to anasarca. There is sigmoid diverticulosis without active inflammatory changes. The appendix is not identified. Vascular/Lymphatic: Mild aortoiliac atherosclerotic disease. The IVC is unremarkable. Right femoral central venous line with tip in the distal IVC. Reproductive: Hysterectomy. Other: Severe diffuse subcutaneous edema and anasarca. Musculoskeletal: Degenerative changes of the spine. No acute osseous pathology. IMPRESSION: 1. Findings most concerning for gastric pneumatosis with small pockets of air likely in the portal vein suggestive of bowel ischemia. Correlation with clinical exam and lactic acid levels recommended. 2. Findings of bowel obstruction with transition in the midline abdomen. 3. Sigmoid diverticulosis. 4. Moderate ascites, significantly increased since the prior CT. 5. Severe diffuse subcutaneous edema and anasarca. 6. Partially visualized small bilateral pleural effusions with partial consolidative changes of the visualized lower lobes which may represent atelectasis or pneumonia. Clinical correlation is recommended. 7. Aortic Atherosclerosis (ICD10-I70.0). These results were called by telephone at the time of interpretation on 10/27/2019 at 12:52 am to Nurse Dossie Der, who verbally acknowledged these results. Electronically Signed   By: Anner Crete M.D.   On: 10/27/2019 01:05   DG Abd 1 View  Result Date: 10/26/2019 CLINICAL DATA:  Abdominal pain and recent paracentesis EXAM: ABDOMEN - 1 VIEW COMPARISON:  10/24/2019 FINDINGS: Scattered large and small bowel  gas is noted although predominately centrally which suggests some persistent ascites despite recent paracentesis. Dialysis catheter is noted in the right femoral vein extending into the IVC. This is new from the prior exam. No bony abnormality is seen. No obstructive changes are noted. IMPRESSION: Changes suggestive of mild residual ascites. No other focal acute abnormality is noted. Electronically Signed   By: Inez Catalina M.D.   On: 10/26/2019 18:59        Scheduled Meds: . Chlorhexidine Gluconate Cloth  6 each Topical Daily  . furosemide  60 mg Intravenous BID  . mouth rinse  15 mL Mouth Rinse BID  . pantoprazole (PROTONIX) IV  40 mg Intravenous BID  . polyethylene glycol  17 g Oral Daily  . sodium chloride flush  3 mL Intravenous Q12H   Continuous Infusions: . sodium chloride Stopped (10/26/19 1410)  . azithromycin Stopped (10/27/19 1741)  . cefTRIAXone (ROCEPHIN)  IV 2 g (10/28/19 1000)  . metronidazole 500 mg (10/28/19 1207)     LOS: 9 days    Time spent: 34 minutes    Sharen Hones, MD [facility]   To contact the attending provider between 7A-7P or the covering provider during after hours 7P-7A, please log into the web site [email or web address] and access using universal [place] password for that web site. If you do not have the password, please call the hospital operator.  10/28/2019, 1:39 PM

## 2019-10-29 ENCOUNTER — Inpatient Hospital Stay: Payer: Medicaid Other

## 2019-10-29 ENCOUNTER — Encounter: Payer: Self-pay | Admitting: Vascular Surgery

## 2019-10-29 ENCOUNTER — Ambulatory Visit: Payer: Medicaid Other | Admitting: Family

## 2019-10-29 DIAGNOSIS — F0390 Unspecified dementia without behavioral disturbance: Secondary | ICD-10-CM | POA: Insufficient documentation

## 2019-10-29 DIAGNOSIS — N179 Acute kidney failure, unspecified: Secondary | ICD-10-CM | POA: Diagnosis not present

## 2019-10-29 DIAGNOSIS — F1027 Alcohol dependence with alcohol-induced persisting dementia: Secondary | ICD-10-CM

## 2019-10-29 DIAGNOSIS — I5041 Acute combined systolic (congestive) and diastolic (congestive) heart failure: Secondary | ICD-10-CM | POA: Diagnosis not present

## 2019-10-29 DIAGNOSIS — J9601 Acute respiratory failure with hypoxia: Secondary | ICD-10-CM | POA: Diagnosis not present

## 2019-10-29 LAB — CBC
HCT: 29.3 % — ABNORMAL LOW (ref 36.0–46.0)
Hemoglobin: 9.8 g/dL — ABNORMAL LOW (ref 12.0–15.0)
MCH: 23.8 pg — ABNORMAL LOW (ref 26.0–34.0)
MCHC: 33.4 g/dL (ref 30.0–36.0)
MCV: 71.3 fL — ABNORMAL LOW (ref 80.0–100.0)
Platelets: 229 10*3/uL (ref 150–400)
RBC: 4.11 MIL/uL (ref 3.87–5.11)
RDW: 21.8 % — ABNORMAL HIGH (ref 11.5–15.5)
WBC: 6.5 10*3/uL (ref 4.0–10.5)
nRBC: 0 % (ref 0.0–0.2)

## 2019-10-29 LAB — BASIC METABOLIC PANEL
Anion gap: 11 (ref 5–15)
BUN: 73 mg/dL — ABNORMAL HIGH (ref 8–23)
CO2: 24 mmol/L (ref 22–32)
Calcium: 8.8 mg/dL — ABNORMAL LOW (ref 8.9–10.3)
Chloride: 102 mmol/L (ref 98–111)
Creatinine, Ser: 4.13 mg/dL — ABNORMAL HIGH (ref 0.44–1.00)
GFR, Estimated: 11 mL/min — ABNORMAL LOW (ref >60)
Glucose, Bld: 127 mg/dL — ABNORMAL HIGH (ref 70–99)
Potassium: 4.1 mmol/L (ref 3.5–5.1)
Sodium: 137 mmol/L (ref 135–145)

## 2019-10-29 LAB — BODY FLUID CELL COUNT WITH DIFFERENTIAL
Eos, Fluid: 0 %
Lymphs, Fluid: 5 %
Monocyte-Macrophage-Serous Fluid: 26 %
Neutrophil Count, Fluid: 69 %
Total Nucleated Cell Count, Fluid: 3048 cu mm

## 2019-10-29 LAB — LACTATE DEHYDROGENASE, PLEURAL OR PERITONEAL FLUID: LD, Fluid: 242 U/L — ABNORMAL HIGH (ref 3–23)

## 2019-10-29 LAB — AMYLASE, PLEURAL OR PERITONEAL FLUID: Amylase, Fluid: 197 U/L

## 2019-10-29 LAB — LIPASE, FLUID: Lipase-Fluid: 1058 U/L

## 2019-10-29 LAB — PROTEIN, PLEURAL OR PERITONEAL FLUID: Total protein, fluid: 3.1 g/dL

## 2019-10-29 LAB — ALBUMIN, PLEURAL OR PERITONEAL FLUID: Albumin, Fluid: 1.6 g/dL

## 2019-10-29 LAB — TOTAL BILIRUBIN, BODY FLUID: Total bilirubin, fluid: 1.4 mg/dL

## 2019-10-29 MED ORDER — ALBUTEROL SULFATE (2.5 MG/3ML) 0.083% IN NEBU
2.5000 mg | INHALATION_SOLUTION | RESPIRATORY_TRACT | Status: DC | PRN
  Administered 2019-11-19 – 2019-11-20 (×2): 2.5 mg via RESPIRATORY_TRACT
  Filled 2019-10-29 (×3): qty 3

## 2019-10-29 MED ORDER — ALBUMIN HUMAN 25 % IV SOLN
25.0000 g | Freq: Once | INTRAVENOUS | Status: AC
  Administered 2019-10-29: 25 g via INTRAVENOUS
  Filled 2019-10-29: qty 100

## 2019-10-29 NOTE — Consult Note (Signed)
[facility] Consult   Reason for Consult: Patient seen chart reviewed.  Consult for this 64 year old woman with multiple medical and social problems because of concern about her capacity to make safe decisions for herself Referring Physician:  [place] Patient Identification: Mikesha Migliaccio MRN:  [id number] Principal Diagnosis: Dementia [facility]) Diagnosis:  Principal Problem:   Dementia ([place]) Active Problems:   AKI (acute kidney injury) ([place])   Crack cocaine use   Acute respiratory failure with hypoxia ([place])   LV (left ventricular) mural thrombus   Acute CHF (congestive heart failure) ([facility])   Cocaine abuse ([place])   Peripheral edema   Cirrhosis of liver ([place])   Total Time spent with patient: 1 hour  Subjective:   Meghna Hagmann is a 64 y.o. female patient admitted with "I feel terrible".  HPI: Patient seen and chart reviewed.  This is a 64 year old woman with multiple medical and social problems who is currently in the hospital because of her heart failure now with renal insufficiency and acute kidney injury.  Fluid buildup in the abdomen from cirrhosis.  Question has been raised about her ability to make decisions for her own placement.  The patient tells me that before she came to the hospital she was staying in her car.  Nurses who have some familiarity with her say they doubt this is true but that she was staying just from one place to another.  The patient was very difficult to interview.  She often slurs her speech and speaks in a mutter.  Often instead of directly answering questions she will turn to the side and talk about some other subject.  She does understand that she is in [facility].  She knew the correct year.  She did know the basic reason that she came into the hospital.  When asked however to describe some more details about her medical problems she was not able to come up with a good description.  I asked her what her plans were when she left the  hospital and she told me that she was currently homeless and would like to have them find her a place to stay.  I asked her if she would like to go to a skilled nursing facility and she said that would be a good idea but also talked about how if they did not treat her right she would get up and walk out like she did the last one.  Was not able to come up with any description of how she would then care for herself.  Patient denies suicidal or homicidal thought.  Denies hallucinations.  Past Psychiatric History: Patient has had a few prior psychiatric evaluations mostly for the same question.  She has had multiple hospitalizations for drug abuse and her medical problems.  The last couple evaluations have tended to conclude that she does not have the ability to make reasonable decisions  Risk to Self:   Risk to Others:   Prior Inpatient Therapy:   Prior Outpatient Therapy:    Past Medical History:  Past Medical History:  Diagnosis Date  . CHF (congestive heart failure) ([place])   . COVID-19   . Hyperlipidemia   . Hypertension   . Renal disorder     Past Surgical History:  Procedure Laterality Date  . DIALYSIS/PERMA CATHETER INSERTION N/A 10/28/2019   Procedure: DIALYSIS/PERMA CATHETER INSERTION;  Surgeon: Katha Cabal, MD;  Location: [facility];  Service: Cardiovascular;  Laterality: N/A;  . RIGHT/LEFT HEART CATH AND  CORONARY ANGIOGRAPHY N/A 12/30/2018   Procedure: RIGHT/LEFT HEART CATH AND CORONARY ANGIOGRAPHY;  Surgeon: Corey Skains, MD;  Location: [facility];  Service: Cardiovascular;  Laterality: N/A;   Family History:  Family History  Problem Relation Age of Onset  . Breast cancer Neg Hx    Family Psychiatric  History: Unknown Social History:  Social History   Substance and Sexual Activity  Alcohol Use No  . Alcohol/week: 0.0 standard drinks     Social History   Substance and Sexual Activity  Drug Use Yes  . Types: Cocaine   Comment: 3-4 days  ago    Social History   Socioeconomic History  . Marital status: Single    Spouse name: Not on file  . Number of children: Not on file  . Years of education: Not on file  . Highest education level: Not on file  Occupational History  . Not on file  Tobacco Use  . Smoking status: Current Every Day Smoker  . Smokeless tobacco: Never Used  Substance and Sexual Activity  . Alcohol use: No    Alcohol/week: 0.0 standard drinks  . Drug use: Yes    Types: Cocaine    Comment: 3-4 days ago  . Sexual activity: Not on file  Other Topics Concern  . Not on file  Social History Narrative  . Not on file   Social Determinants of Health   Financial Resource Strain:   . Difficulty of Paying Living Expenses: Not on file  Food Insecurity:   . Worried About Charity fundraiser in the Last Year: Not on file  . Ran Out of Food in the Last Year: Not on file  Transportation Needs:   . Lack of Transportation (Medical): Not on file  . Lack of Transportation (Non-Medical): Not on file  Physical Activity:   . Days of Exercise per Week: Not on file  . Minutes of Exercise per Session: Not on file  Stress:   . Feeling of Stress : Not on file  Social Connections:   . Frequency of Communication with Friends and Family: Not on file  . Frequency of Social Gatherings with Friends and Family: Not on file  . Attends Religious Services: Not on file  . Active Member of Clubs or Organizations: Not on file  . Attends Archivist Meetings: Not on file  . Marital Status: Not on file   Additional Social History:    Allergies:   Allergies  Allergen Reactions  . Penicillins Hives, Rash and Other (See Comments)    Did it involve swelling of the face/tongue/throat, SOB, or low BP? No Did it involve sudden or severe rash/hives, skin peeling, or any reaction on the inside of your mouth or nose? Yes Did you need to seek medical attention at a hospital or doctor's office? Yes When did it last happen?  >25 years If all above answers are "NO", may proceed with cephalosporin use.   . Ace Inhibitors Nausea And Vomiting    Labs:  Results for orders placed or performed during the hospital encounter of 10/19/19 (from the past 48 hour(s))  CBC     Status: Abnormal   Collection Time: 10/28/19  5:03 AM  Result Value Ref Range   WBC 7.7 4.0 - 10.5 K/uL   RBC 4.28 3.87 - 5.11 MIL/uL   Hemoglobin 10.0 (L) 12.0 - 15.0 g/dL   HCT 30.4 (L) 36 - 46 %   MCV 71.0 (L) 80.0 - 100.0 fL  MCH 23.4 (L) 26.0 - 34.0 pg   MCHC 32.9 30.0 - 36.0 g/dL   RDW 22.1 (H) 11.5 - 15.5 %   Platelets 222 150 - 400 K/uL   nRBC 0.3 (H) 0.0 - 0.2 %    Comment: Performed at [facility], [address] [place], [place] [address]  Basic metabolic panel     Status: Abnormal   Collection Time: 10/28/19  5:03 AM  Result Value Ref Range   Sodium 136 135 - 145 mmol/L   Potassium 4.0 3.5 - 5.1 mmol/L   Chloride 101 98 - 111 mmol/L   CO2 25 22 - 32 mmol/L   Glucose, Bld 104 (H) 70 - 99 mg/dL    Comment: Glucose reference range applies only to samples taken after fasting for at least 8 hours.   BUN 70 (H) 8 - 23 mg/dL   Creatinine, Ser 4.01 (H) 0.44 - 1.00 mg/dL   Calcium 9.0 8.9 - 10.3 mg/dL   GFR, Estimated 11 (L) >60 mL/min   Anion gap 10 5 - 15    Comment: Performed at [facility], [address] [place], [place] [address]  CBC     Status: Abnormal   Collection Time: 10/29/19  4:13 AM  Result Value Ref Range   WBC 6.5 4.0 - 10.5 K/uL   RBC 4.11 3.87 - 5.11 MIL/uL   Hemoglobin 9.8 (L) 12.0 - 15.0 g/dL   HCT 29.3 (L) 36 - 46 %   MCV 71.3 (L) 80.0 - 100.0 fL   MCH 23.8 (L) 26.0 - 34.0 pg   MCHC 33.4 30.0 - 36.0 g/dL   RDW 21.8 (H) 11.5 - 15.5 %   Platelets 229 150 - 400 K/uL   nRBC 0.0 0.0 - 0.2 %    Comment: Performed at [facility], [address], [place], [place] [address]  Basic metabolic panel     Status: Abnormal   Collection Time: 10/29/19  4:13 AM  Result Value  Ref Range   Sodium 137 135 - 145 mmol/L   Potassium 4.1 3.5 - 5.1 mmol/L   Chloride 102 98 - 111 mmol/L   CO2 24 22 - 32 mmol/L   Glucose, Bld 127 (H) 70 - 99 mg/dL    Comment: Glucose reference range applies only to samples taken after fasting for at least 8 hours.   BUN 73 (H) 8 - 23 mg/dL   Creatinine, Ser 4.13 (H) 0.44 - 1.00 mg/dL   Calcium 8.8 (L) 8.9 - 10.3 mg/dL   GFR, Estimated 11 (L) >60 mL/min   Anion gap 11 5 - 15    Comment: Performed at [facility], [address] [place], [place] [address]  Amylase, pleural or peritoneal fluid     Status: None   Collection Time: 10/29/19  1:39 PM  Result Value Ref Range   Amylase, Fluid 197 U/L    Comment: NO NORMAL RANGE ESTABLISHED FOR THIS TEST   Fluid Type-FAMY CYTO PERI     Comment: Performed at [facility], [address], [place], [place] [address]    Current Facility-Administered Medications  Medication Dose Route Frequency Provider Last Rate Last Admin  . 0.9 %  sodium chloride infusion  250 mL Intravenous PRN Schnier, Dolores Lory, MD   Stopped at 10/26/19 1410  . acetaminophen (TYLENOL) tablet 650 mg  650 mg Oral Q4H PRN Delana Meyer Dolores Lory, MD   650 mg at 10/28/19 0254  . albumin human 25 %  solution 25 g  25 g Intravenous Once Croley, Granville M, PA-C      . alum & mag hydroxide-simeth (MAALOX/MYLANTA) 200-200-20 MG/5ML suspension 30 mL  30 mL Oral Q4H PRN Schnier, Dolores Lory, MD   30 mL at 10/23/19 2205   And  . lidocaine (XYLOCAINE) 2 % viscous mouth solution 15 mL  15 mL Oral Q4H PRN Schnier, Dolores Lory, MD   15 mL at 10/23/19 2205  . bisacodyl (DULCOLAX) suppository 10 mg  10 mg Rectal Daily PRN Schnier, Dolores Lory, MD   10 mg at 10/24/19 0459  . cefTRIAXone (ROCEPHIN) 2 g in sodium chloride 0.9 % 100 mL IVPB  2 g Intravenous Daily Schnier, Dolores Lory, MD 200 mL/hr at 10/29/19 0920 2 g at 10/29/19 0920  . Chlorhexidine Gluconate Cloth 2 % PADS 6 each  6 each Topical Daily Schnier, Dolores Lory, MD   6  each at 10/29/19 0913  . furosemide (LASIX) injection 60 mg  60 mg Intravenous BID Katha Cabal, MD   60 mg at 10/29/19 0905  . Glycerin (Adult) 2.1 g suppository 1 suppository  1 suppository Rectal Daily PRN Schnier, Dolores Lory, MD      . HYDROmorphone (DILAUDID) injection 1 mg  1 mg Intravenous Once PRN Schnier, Dolores Lory, MD      . MEDLINE mouth rinse  15 mL Mouth Rinse BID Schnier, Dolores Lory, MD   15 mL at 10/29/19 0905  . midodrine (PROAMATINE) tablet 2.5 mg  2.5 mg Oral TID WC PRN Schnier, Dolores Lory, MD   2.5 mg at 10/26/19 1038  . ondansetron (ZOFRAN) injection 4 mg  4 mg Intravenous Q8H PRN Schnier, Dolores Lory, MD   4 mg at 10/24/19 1901  . ondansetron (ZOFRAN) injection 4 mg  4 mg Intravenous Q6H PRN Schnier, Dolores Lory, MD      . pantoprazole (PROTONIX) injection 40 mg  40 mg Intravenous BID Delana Meyer Dolores Lory, MD   40 mg at 10/29/19 0904  . polyethylene glycol (MIRALAX / GLYCOLAX) packet 17 g  17 g Oral Daily Schnier, Dolores Lory, MD   17 g at 10/29/19 0912  . sodium chloride flush (NS) 0.9 % injection 3 mL  3 mL Intravenous Q12H Schnier, Dolores Lory, MD   3 mL at 10/29/19 0914  . sodium chloride flush (NS) 0.9 % injection 3 mL  3 mL Intravenous PRN Schnier, Dolores Lory, MD   3 mL at 10/28/19 1120    Musculoskeletal: Strength & Muscle Tone: decreased Gait & Station: unsteady Patient leans: N/A  Psychiatric Specialty Exam: Physical Exam Vitals and nursing note reviewed.  Constitutional:      Appearance: She is well-developed.  HENT:     Head: Normocephalic and atraumatic.  Eyes:     Conjunctiva/sclera: Conjunctivae normal.     Pupils: Pupils are equal, round, and reactive to light.  Cardiovascular:     Heart sounds: Normal heart sounds.  Pulmonary:     Effort: Pulmonary effort is normal.  Abdominal:     Palpations: Abdomen is soft.  Musculoskeletal:        General: Normal range of motion.     Cervical back: Normal range of motion.  Skin:    General: Skin is warm and dry.   Neurological:     General: No focal deficit present.     Mental Status: She is alert.  Psychiatric:        Attention and Perception: She is inattentive.  Mood and Affect: Affect is inappropriate.        Speech: She is noncommunicative.        Behavior: Behavior is slowed and withdrawn.        Thought Content: Thought content does not include homicidal or suicidal ideation.        Cognition and Memory: Cognition is impaired. Memory is impaired.        Judgment: Judgment is inappropriate.     Review of Systems  Constitutional: Positive for fatigue.  HENT: Negative.   Eyes: Negative.   Respiratory: Negative.   Cardiovascular: Negative.   Gastrointestinal: Positive for abdominal distention and abdominal pain.  Musculoskeletal: Negative.   Skin: Negative.   Neurological: Negative.   Psychiatric/Behavioral: Positive for confusion. Negative for suicidal ideas.    Blood pressure 106/81, pulse 90, temperature (!) 97.5 F (36.4 C), temperature source Oral, resp. rate 17, height 5\' 6"  (1.676 m), weight 109.3 kg, SpO2 98 %.Body mass index is 38.89 kg/m.  General Appearance: Casual  Eye Contact:  Minimal  Speech:  Garbled and Slow  Volume:  Decreased  Mood:  Irritable  Affect:  Inappropriate  Thought Process:  Disorganized  Orientation:  Full (Time, Place, and Person)  Thought Content:  Illogical, Rumination and Tangential  Suicidal Thoughts:  No  Homicidal Thoughts:  No  Memory:  Immediate;   Fair Recent;   Fair Remote;   Fair  Judgement:  Impaired  Insight:  Shallow  Psychomotor Activity:  Decreased  Concentration:  Concentration: Poor  Recall:  [employer] of Knowledge:  Fair  Language:  Fair  Akathisia:  No  Handed:  Right  AIMS (if indicated):     Assets:  Desire for Improvement  ADL's:  Impaired  Cognition:  Impaired,  Mild and Moderate  Sleep:        Treatment Plan Summary: Plan Patient interviewed.  Treatment team very anxious to get some ruling on her  capacity.  Currently she is telling me that she would agree to a skilled nursing facility but I also think that on the whole she does not show the ability to make good decisions for herself.  She could not stay on topic or discuss her medical problems in any detail.  She could not answer questions about how she would take care of her self if she did walk out of a skilled nursing facility.  I am aware that Adult Protective Services has closed her case but I am not sure what all the details are.  At this point I think it seems to me that she is mostly unable to make decisions for herself safely about her overall treatment especially about discharge planning given her multiple unsafe behaviors in the past.  Disposition: Patient does not meet criteria for psychiatric inpatient admission.  Alethia Berthold, MD 10/29/2019 4:46 PM

## 2019-10-29 NOTE — TOC Progression Note (Signed)
Transition of Care [facility]) - Progression Note    Patient Details  Name: Katelyn Lane MRN: [id number] Date of Birth: 11/21/1955  [facility] CM/SW [place], LCSW Phone Number: 10/29/2019, 10:12 AM  Clinical Narrative:   CSW spoke with pt's APS worker [name] and she states pt's case is closed. She states if psychiatry deems pt unable to make her own decisions a new APS case would need to be made. Last Psych note was placed 10/14--but unable to see pt. Awaiting psych eval.         Expected Discharge Plan and Services                                                 Social Determinants of Health (SDOH) Interventions    Readmission Risk Interventions Readmission Risk Prevention Plan 09/02/2019 08/14/2019 07/27/2019  Transportation Screening Complete Complete Complete  Medication Review Press photographer) Complete Referral to Pharmacy -  PCP or Specialist appointment within 3-5 days of discharge Complete Complete Complete  HRI or Home Care Consult - Complete Complete  SW Recovery Care/Counseling Consult - Complete Complete  Palliative Care Screening Not Applicable Not Applicable Not Applicable  Skilled Nursing Facility Complete Not Applicable Not Applicable  Some recent data might be hidden

## 2019-10-29 NOTE — Progress Notes (Signed)
[facility]  ROUNDING NOTE   Subjective:   Katelyn Lane admitted on 10/19/2019 for Peripheral edema [R60.9] Acute CHF (congestive heart failure) ([place]) [I50.9] AKI (acute kidney injury) ([place]) [N17.9] Congestive heart failure, unspecified HF chronicity, unspecified heart failure type ([place]) [I50.9]  Patient resting in bed, in no acute distress, has c/o chest congestion and productive cough.She denies worsening SOB, nausea or vomiting.   Objective:  Vital signs in last 24 hours:  Temp:  [97.5 F (36.4 C)-98.6 F (37 C)] 97.5 F (36.4 C) (10/21 0832) Pulse Rate:  [63-98] 85 (10/21 0832) Resp:  [12-23] 17 (10/21 0832) BP: (74-114)/(51-81) 114/81 (10/21 0832) SpO2:  [92 %-100 %] 97 % (10/21 1100) Weight:  [109.3 kg] 109.3 kg (10/21 0350)  Weight change: 1.026 kg Filed Weights   10/27/19 0519 10/28/19 0334 10/29/19 0350  Weight: 108.4 kg 108.3 kg 109.3 kg    Intake/Output: I/O last 3 completed shifts: In: 594.6 [IV Piggyback:594.6] Out: 4401 [Urine:4400; Stool:1]   Intake/Output this shift:  Total I/O In: 240 [P.O.:240] Out: 0   Physical Exam: General:  Resting in bed,in no acute distress  Head  Normocephalic, atraumatic,Nasal Cannula in place  Neck: Supple  Lungs:   Wheezing + bilaterally,productive cough +,O2 2L via nasal canula   Heart:  Regular rate and rhythm  Abdomen:   Non tender, Lower Abdominal swelling +  Extremities: 2+ pitting edema on bilateral lower extremities  Neurologic:  Oriented  Skin: No acute lesions or rashes  Rt Femoral Catheter  Basic Metabolic Panel: Recent Labs  Lab 10/25/19 0018 10/25/19 0018 10/25/19 0421 10/25/19 0421 10/25/19 1119 10/26/19 0645 10/26/19 0645 10/27/19 0230 10/28/19 0503 10/29/19 0413  NA 138   < > 138  --   --  139  --  136 136 137  K 5.4*   < > 5.0  --   --  4.7  --  4.5 4.0 4.1  CL 105   < > 105  --   --  105  --  103 101 102  CO2 23   < > 22  --   --  24  --  24 25 24   GLUCOSE 124*   < >  119*  --   --  91  --  111* 104* 127*  BUN 61*   < > 61*  --   --  61*  --  66* 70* 73*  CREATININE 3.27*   < > 3.25*  --   --  3.25*  --  3.67* 4.01* 4.13*  CALCIUM 9.5   < > 9.2   < >  --  9.0   < > 9.0 9.0 8.8*  MG 3.0*  --   --   --   --   --   --   --   --   --   PHOS  --   --   --   --  4.2  --   --   --   --   --    < > = values in this interval not displayed.    Liver Function Tests: Recent Labs  Lab 10/23/19 0450 10/24/19 0545 10/25/19 0421 10/26/19 0645 10/27/19 0230  AST 23 25 17 16  14*  ALT 15 14 10 8 8   ALKPHOS 119 113 88 73 68  BILITOT 1.4* 2.3* 2.2* 2.2* 1.7*  PROT 6.0* 7.0 6.1* 5.4* 5.0*  ALBUMIN 2.5*  2.5* 3.2* 3.0* 2.6* 2.4*   Recent Labs  Lab  10/23/19 0450 10/24/19 0545  LIPASE 79* 55*   No results for input(s): AMMONIA in the last 168 hours.  CBC: Recent Labs  Lab 10/25/19 0421 10/26/19 0645 10/27/19 0230 10/28/19 0503 10/29/19 0413  WBC 10.5 10.7* 10.3 7.7 6.5  HGB 13.2 11.4* 10.3* 10.0* 9.8*  HCT 41.6 34.8* 31.4* 30.4* 29.3*  MCV 74.4* 72.7* 73.0* 71.0* 71.3*  PLT 302 260 245 222 229    Cardiac Enzymes: No results for input(s): CKTOTAL, CKMB, CKMBINDEX, TROPONINI in the last 168 hours.  BNP: Invalid input(s): POCBNP  CBG: No results for input(s): GLUCAP in the last 168 hours.  Microbiology: Results for orders placed or performed during the hospital encounter of 10/19/19  Respiratory Panel by RT PCR (Flu A&B, Covid) - Nasopharyngeal Swab     Status: None   Collection Time: 10/19/19 11:13 PM   Specimen: Nasopharyngeal Swab  Result Value Ref Range Status   SARS Coronavirus 2 by RT PCR NEGATIVE NEGATIVE Final    Comment: (NOTE) SARS-CoV-2 target nucleic acids are NOT DETECTED.  The SARS-CoV-2 RNA is generally detectable in upper respiratoy specimens during the acute phase of infection. The lowest concentration of SARS-CoV-2 viral copies this assay can detect is 131 copies/mL. A negative result does not preclude  SARS-Cov-2 infection and should not be used as the sole basis for treatment or other patient management decisions. A negative result may occur with  improper specimen collection/handling, submission of specimen other than nasopharyngeal swab, presence of viral mutation(s) within the areas targeted by this assay, and inadequate number of viral copies (<131 copies/mL). A negative result must be combined with clinical observations, patient history, and epidemiological information. The expected result is Negative.  Fact Sheet for Patients:  PinkCheek.be  Fact Sheet for Healthcare Providers:  GravelBags.it  This test is no t yet approved or cleared by the [place] FDA and  has been authorized for detection and/or diagnosis of SARS-CoV-2 by FDA under an Emergency Use Authorization (EUA). This EUA will remain  in effect (meaning this test can be used) for the duration of the COVID-19 declaration under Section 564(b)(1) of the Act, 21 U.S.C. section 360bbb-3(b)(1), unless the authorization is terminated or revoked sooner.     Influenza A by PCR NEGATIVE NEGATIVE Final   Influenza B by PCR NEGATIVE NEGATIVE Final    Comment: (NOTE) The Xpert Xpress SARS-CoV-2/FLU/RSV assay is intended as an aid in  the diagnosis of influenza from Nasopharyngeal swab specimens and  should not be used as a sole basis for treatment. Nasal washings and  aspirates are unacceptable for Xpert Xpress SARS-CoV-2/FLU/RSV  testing.  Fact Sheet for Patients: PinkCheek.be  Fact Sheet for Healthcare Providers: GravelBags.it  This test is not yet approved or cleared by the [place] FDA and  has been authorized for detection and/or diagnosis of SARS-CoV-2 by  FDA under an Emergency Use Authorization (EUA). This EUA will remain  in effect (meaning this test can be used) for the duration of the   Covid-19 declaration under Section 564(b)(1) of the Act, 21  U.S.C. section 360bbb-3(b)(1), unless the authorization is  terminated or revoked. Performed at [facility], [address] [place], [place] [address]   MRSA PCR Screening     Status: None   Collection Time: 10/25/19  2:50 PM   Specimen: Nasopharyngeal  Result Value Ref Range Status   MRSA by PCR NEGATIVE NEGATIVE Final    Comment:        The GeneXpert MRSA Assay (FDA  approved for NASAL specimens only), is one component of a comprehensive MRSA colonization surveillance program. It is not intended to diagnose MRSA infection nor to guide or monitor treatment for MRSA infections. Performed at [facility], [address] [place], [place] [address]   CULTURE, BLOOD (ROUTINE X 2) w Reflex to ID Panel     Status: None (Preliminary result)   Collection Time: 10/25/19  4:29 PM   Specimen: BLOOD  Result Value Ref Range Status   Specimen Description BLOOD CENTRAL LINE  Final   Special Requests   Final    BOTTLES DRAWN AEROBIC AND ANAEROBIC Blood Culture adequate volume   Culture   Final    NO GROWTH 4 DAYS Performed at [facility], [address] [place], [place] [address]    Report Status PENDING  Incomplete  CULTURE, BLOOD (ROUTINE X 2) w Reflex to ID Panel     Status: None (Preliminary result)   Collection Time: 10/26/19  6:45 AM   Specimen: BLOOD  Result Value Ref Range Status   Specimen Description BLOOD BLOOD LEFT HAND  Final   Special Requests   Final    BOTTLES DRAWN AEROBIC AND ANAEROBIC Blood Culture adequate volume   Culture   Final    NO GROWTH 3 DAYS Performed at [facility], [address], [place], [place] [address]    Report Status PENDING  Incomplete  Body fluid culture     Status: None (Preliminary result)   Collection Time: 10/26/19 11:30 AM   Specimen: Fluid  Result Value Ref Range Status   Specimen Description   Final    FLUID Performed at  [facility], [address] [place], [place] [address]    Special Requests   Final    PERITONEAL Performed at [facility], [address] [place], [place] [address]    Gram Stain   Final    ABUNDANT WBC PRESENT, PREDOMINANTLY PMN NO ORGANISMS SEEN    Culture   Final    NO GROWTH 3 DAYS Performed at [facility], [place] [address] [place], [place] [address]    Report Status PENDING  Incomplete    Coagulation Studies: Recent Labs    10/27/19 0230  LABPROT 22.0*  INR 2.0*    Urinalysis: No results for input(s): COLORURINE, LABSPEC, PHURINE, GLUCOSEU, HGBUR, BILIRUBINUR, KETONESUR, PROTEINUR, UROBILINOGEN, NITRITE, LEUKOCYTESUR in the last 72 hours.  Invalid input(s): APPERANCEUR    Imaging: PERIPHERAL VASCULAR CATHETERIZATION  Result Date: 10/29/2019 See op note    Medications:   . sodium chloride Stopped (10/26/19 1410)  . cefTRIAXone (ROCEPHIN)  IV 2 g (10/29/19 0920)   . Chlorhexidine Gluconate Cloth  6 each Topical Daily  . furosemide  60 mg Intravenous BID  . mouth rinse  15 mL Mouth Rinse BID  . pantoprazole (PROTONIX) IV  40 mg Intravenous BID  . polyethylene glycol  17 g Oral Daily  . sodium chloride flush  3 mL Intravenous Q12H   sodium chloride, acetaminophen, alum & mag hydroxide-simeth **AND** lidocaine, bisacodyl, Glycerin (Adult), HYDROmorphone (DILAUDID) injection, midodrine, ondansetron (ZOFRAN) IV, ondansetron (ZOFRAN) IV, sodium chloride flush  Assessment/ Plan:  Katelyn Lane is a 64 y.o. black female with congestive heart failure, hypertension, hyperlipidemia, gout, COPD, anemia, cocaine abuse who is admitted to [facility] on 10/19/2019 for Peripheral edema [R60.9] Acute CHF (congestive heart failure) ([place]) [I50.9] AKI (acute kidney injury) ([place]) [N17.9] Congestive heart failure, unspecified HF chronicity, unspecified heart failure type ([place]) [I50.9]  1. Acute kidney injury on chronic kidney disease stage IIIB.  Baseline creatinine of 1.66, GFR of 32. With history of proteinuria.  Chronic kidney disease secondary to hypertension  Acute renal failure secondary to acute cardiorenal syndrome  -Patient received  Dialysis with UF only session on 10/26/19 , with 1.5L fluid removal. Lab Results  Component Value Date   CREATININE 4.13 (H) 10/29/2019   CREATININE 4.01 (H) 10/28/2019   CREATININE 3.67 (H) 10/27/2019   Urine Output for the preceding 24 hours is 3400 ml -Will continue monitoring renal function, no plan for dialysis today   2. Hypotension with acute exacerbation of systolic and diastolic congestive heart failure. Echocardiogram 06/22/19 EF of 25-30%.  -on 3 L nasal cannula -Normotensive  3. Ascites/ Bacterial Peritonitis Patient is on IV antibiotics,Rocephin and Flagyl Gastroenterology team managing       LOS: 10 Lejend Dalby 10/21/20211:08 PM

## 2019-10-29 NOTE — Progress Notes (Signed)
GI Inpatient Follow-up Note  Subjective:  Patient seen in follow-up for SBP, UGI bleed. No acute events overnight. No overt gastrointestinal blood loss since admission. Patient denies any new complaints. She denies any nausea, vomiting, or abdominal pain. She is alert and conversant.   Scheduled Inpatient Medications:  . Chlorhexidine Gluconate Cloth  6 each Topical Daily  . furosemide  60 mg Intravenous BID  . mouth rinse  15 mL Mouth Rinse BID  . pantoprazole (PROTONIX) IV  40 mg Intravenous BID  . polyethylene glycol  17 g Oral Daily  . sodium chloride flush  3 mL Intravenous Q12H    Continuous Inpatient Infusions:   . sodium chloride Stopped (10/26/19 1410)  . cefTRIAXone (ROCEPHIN)  IV 2 g (10/29/19 0920)    PRN Inpatient Medications:  sodium chloride, acetaminophen, alum & mag hydroxide-simeth **AND** lidocaine, bisacodyl, Glycerin (Adult), HYDROmorphone (DILAUDID) injection, midodrine, ondansetron (ZOFRAN) IV, ondansetron (ZOFRAN) IV, sodium chloride flush  Review of Systems: Constitutional: Weight is stable.  Eyes: No changes in vision. ENT: No oral lesions, sore throat.  GI: see HPI.  Heme/Lymph: No easy bruising.  CV: No chest pain.  GU: No hematuria.  Integumentary: No rashes.  Neuro: No headaches.  Psych: No depression/anxiety.  Endocrine: No heat/cold intolerance.  Allergic/Immunologic: No urticaria.  Resp: No cough, SOB.  Musculoskeletal: No joint swelling.    Physical Examination: BP 105/76 (BP Location: Left Arm)   Pulse 90   Temp (!) 97.5 F (36.4 C) (Oral)   Resp 17   Ht 5\' 6"  (1.676 m)   Wt 109.3 kg   SpO2 98%   BMI 38.89 kg/m  Gen: alert, somewhat confused HEENT: PEERLA, EOMI, Neck: supple, no JVD or thyromegaly Chest: CTA bilaterally, no wheezes, crackles, or other adventitious sounds CV: RRR, no m/g/c/r Abd: soft, ND, +BS in all four quadrants; tender to deep palpation in epigastric and LUQ, pain not out of proportion to exam, no HSM,  guarding, ridigity, or rebound tenderness Ext: no edema, well perfused with 2+ pulses, Skin: no rash or lesions noted Lymph: no LAD  Data: Lab Results  Component Value Date   WBC 6.5 10/29/2019   HGB 9.8 (L) 10/29/2019   HCT 29.3 (L) 10/29/2019   MCV 71.3 (L) 10/29/2019   PLT 229 10/29/2019   Recent Labs  Lab 10/27/19 0230 10/28/19 0503 10/29/19 0413  HGB 10.3* 10.0* 9.8*   Lab Results  Component Value Date   NA 137 10/29/2019   K 4.1 10/29/2019   CL 102 10/29/2019   CO2 24 10/29/2019   BUN 73 (H) 10/29/2019   CREATININE 4.13 (H) 10/29/2019   Lab Results  Component Value Date   ALT 8 10/27/2019   AST 14 (L) 10/27/2019   ALKPHOS 68 10/27/2019   BILITOT 1.7 (H) 10/27/2019   Recent Labs  Lab 10/27/19 0230  INR 2.0*   Assessment:  1. Ascites -Fluid analysis consistent with SBP. CT images 10/18 reviewed. General Surgery has been consulted and appreciate recommendations. No urgent surgical intervention advised.  2. UGI bleed - historical - Hgb stable at 9.8. No hemetemesis or melena.  3. Hypotension - Relative to renal failure, cirrhosis, now concerned about sepsis given peritoneal fluid analysis.  4. CHF.  5. Polysubtance abuse.   Plan: 1.Cultures negative to date. Gram stain showing abundant WBC, mostly PMNs consistent with SBP 2. Continue IV antibiotics 3. Plan for repeat paracentesis with fluid analysis to help assess response to IV antibiotics and help to guide therapy 4. Monitor  H/H.  5. Following along with you   Please call with questions or concerns.    Octavia Bruckner, PA-C [facility] [phone] (Cell)

## 2019-10-29 NOTE — Progress Notes (Addendum)
PROGRESS NOTE    Katelyn Lane  DQQ:[id number] DOB: 1955/06/02 DOA: 10/19/2019 PCP: [name], MD   Chief complaint.  Abdominal pain. 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 Found to have BNP greater than 4500, elevated troponin and AKI.  Multiple prior admissions with similar symptoms and complaints. Prolonged hospitalization in August 2021 when she was discharged to SNF. Not clear when she get out of SNF. Continues to complain that she is homeless but per social worker she do have a house. Not take her medication despite being provided with medical management.  After admission to the hospital, she was diagnosed with acute on chronic combined systolic and diastolic congestive heart failure with ejection fraction 25 to 30%.  She was not responding to IV Lasix.  She was seen by nephrology, temporary dialysis catheter was performed and she was dialyzed on 10/17 and 10/18. Patient also complaining of abdominal pain, she had a paracentesis, study showed possible spontaneous peritonitis, she was treated with Rocephin and Flagyl.  Patient had a worsening respiratory status on 10/17, was diagnosed with aspiration pneumonia.  She was treated with Rocephin, Zithromax and Flagyl.  10/21.  Repeat paracentesis performed, antibiotic changed to Rocephin only.  Permacath placed.  Assessment & Plan:   Principal Problem:   Dementia ([place]) Active Problems:   AKI (acute kidney injury) ([place])   Crack cocaine use   Acute respiratory failure with hypoxia (H[facility])   LV (left ventricular) mural thrombus   Acute CHF (congestive heart failure) (H[facility])   Cocaine abuse ([place])   Peripheral edema   Cirrhosis of  liver ([place])  #1. spontaneous bacterial peritonitis. Condition seem to be improving.  Culture from the ascites was negative.  Continue Rocephin.  #2.  Possible GI bleed. No additional bleeding.  Currently on IV Protonix.  3.  Acute on chronic combined systolic and diastolic congestive heart failure. Acute hypoxemic respiratory failure. Patient still on IV Lasix, also received dialysis. Patient has some wheezing today, will add as needed albuterol treatment.  4.  Nonsustained ventricular tachycardia. Patient has 12 beats of atrial tachycardia today.  This is due to severe LV dysfunction.  Will monitor.  5.  Aspiration pneumonia. Condition improved.  Continue Rocephin.  6.  Liver cirrhosis with ascites. Patient had a second paracentesis today.  Lab still pending.  7.  COPD. No bronchospasm.  8.  History of left ventricular thrombus and a history of CVA. Anticoagulation on hold due to concern of GI bleed.  If problem resolved, will start with Eliquis instead.  9.  Confusion. Not sure patient has decision-making capacity, obtain psychiatry consult.    DVT prophylaxis: SCDs Code Status: Full Family Communication: None  .   Status is: Inpatient  Remains inpatient appropriate because:Inpatient level of care appropriate due to severity of illness   Dispo: The patient is from: Home              Anticipated d/c is to: SNF              Anticipated d/c date is: 3 days              Patient currently is not medically stable to d/c.        I/O last 3 completed shifts: In: 594.6 [IV JHERDEYCX:448.1] Out:  24 [Urine:4400; Stool:1] Total I/O In: 44 [P.O.:240] Out: 0      Consultants:   Nephrology  Procedures: Permcath, paracentesis  Antimicrobials:  Rocephin.  Subjective: Patient states that abdominal pain is much better today.  No nausea vomiting. No fever or chills. No short of breath or cough. No headache or dizziness.  Objective: Vitals:   10/29/19  0832 10/29/19 1100 10/29/19 1333 10/29/19 1507  BP: 114/81  105/76 106/81  Pulse: 85  90   Resp: 17     Temp: (!) 97.5 F (36.4 C)     TempSrc: Oral     SpO2: 99% 97% 98% 98%  Weight:      Height:        Intake/Output Summary (Last 24 hours) at 10/29/2019 1646 Last data filed at 10/29/2019 1630 Gross per 24 hour  Intake 834.58 ml  Output 2501 ml  Net -1666.42 ml   Filed Weights   10/27/19 0519 10/28/19 0334 10/29/19 0350  Weight: 108.4 kg 108.3 kg 109.3 kg    Examination:  General exam: Appears calm and comfortable  Respiratory system: Clear to auscultation. Respiratory effort normal. Cardiovascular system: S1 & S2 heard, RRR. No JVD, murmurs, rubs, gallops or clicks. 2+ pedal edema. Gastrointestinal system: Abdomen is nondistended, soft and nontender. No organomegaly or masses felt. Normal bowel sounds heard. Central nervous system: Alert and oriented x2. No focal neurological deficits. Extremities: Symmetric  Skin: No rashes, lesions or ulcers Psychiatry: Mood & affect appropriate.     Data Reviewed: I have personally reviewed following labs and imaging studies  CBC: Recent Labs  Lab 10/25/19 0421 10/26/19 0645 10/27/19 0230 10/28/19 0503 10/29/19 0413  WBC 10.5 10.7* 10.3 7.7 6.5  HGB 13.2 11.4* 10.3* 10.0* 9.8*  HCT 41.6 34.8* 31.4* 30.4* 29.3*  MCV 74.4* 72.7* 73.0* 71.0* 71.3*  PLT 302 260 245 222 562   Basic Metabolic Panel: Recent Labs  Lab 10/25/19 0018 10/25/19 0018 10/25/19 0421 10/25/19 1119 10/26/19 0645 10/27/19 0230 10/28/19 0503 10/29/19 0413  NA 138   < > 138  --  139 136 136 137  K 5.4*   < > 5.0  --  4.7 4.5 4.0 4.1  CL 105   < > 105  --  105 103 101 102  CO2 23   < > 22  --  24 24 25 24   GLUCOSE 124*   < > 119*  --  91 111* 104* 127*  BUN 61*   < > 61*  --  61* 66* 70* 73*  CREATININE 3.27*   < > 3.25*  --  3.25* 3.67* 4.01* 4.13*  CALCIUM 9.5   < > 9.2  --  9.0 9.0 9.0 8.8*  MG 3.0*  --   --   --   --   --   --   --   PHOS   --   --   --  4.2  --   --   --   --    < > = values in this interval not displayed.   GFR: Estimated Creatinine Clearance: 17.5 mL/min (A) (by C-G formula based on SCr of 4.13 mg/dL (H)). Liver Function Tests: Recent Labs  Lab 10/23/19 0450 10/24/19 0545 10/25/19 0421 10/26/19 0645 10/27/19 0230  AST 23 25 17 16  14*  ALT 15 14 10 8 8   ALKPHOS 119 113 88 73 68  BILITOT 1.4* 2.3* 2.2* 2.2* 1.7*  PROT 6.0* 7.0 6.1* 5.4* 5.0*  ALBUMIN 2.5*  2.5* 3.2* 3.0* 2.6* 2.4*   Recent Labs  Lab 10/23/19 0450 10/24/19 0545  LIPASE 79* 55*   No results for input(s): AMMONIA in the last 168 hours. Coagulation Profile: Recent Labs  Lab 10/27/19 0230  INR 2.0*   Cardiac Enzymes: No results for input(s): CKTOTAL, CKMB, CKMBINDEX, TROPONINI in the last 168 hours. BNP (last 3 results) No results for input(s): PROBNP in the last 8760 hours. HbA1C: No results for input(s): HGBA1C in the last 72 hours. CBG: No results for input(s): GLUCAP in the last 168 hours. Lipid Profile: No results for input(s): CHOL, HDL, LDLCALC, TRIG, CHOLHDL, LDLDIRECT in the last 72 hours. Thyroid Function Tests: No results for input(s): TSH, T4TOTAL, FREET4, T3FREE, THYROIDAB in the last 72 hours. Anemia Panel: No results for input(s): VITAMINB12, FOLATE, FERRITIN, TIBC, IRON, RETICCTPCT in the last 72 hours. Sepsis Labs: Recent Labs  Lab 10/25/19 0421 10/27/19 0320  PROCALCITON 14.74  --   LATICACIDVEN  --  1.0    Recent Results (from the past 240 hour(s))  Respiratory Panel by RT PCR (Flu A&B, Covid) - Nasopharyngeal Swab     Status: None   Collection Time: 10/19/19 11:13 PM   Specimen: Nasopharyngeal Swab  Result Value Ref Range Status   SARS Coronavirus 2 by RT PCR NEGATIVE NEGATIVE Final    Comment: (NOTE) SARS-CoV-2 target nucleic acids are NOT DETECTED.  The SARS-CoV-2 RNA is generally detectable in upper respiratoy specimens during the acute phase of infection. The lowest concentration of  SARS-CoV-2 viral copies this assay can detect is 131 copies/mL. A negative result does not preclude SARS-Cov-2 infection and should not be used as the sole basis for treatment or other patient management decisions. A negative result may occur with  improper specimen collection/handling, submission of specimen other than nasopharyngeal swab, presence of viral mutation(s) within the areas targeted by this assay, and inadequate number of viral copies (<131 copies/mL). A negative result must be combined with clinical observations, patient history, and epidemiological information. The expected result is Negative.  Fact Sheet for Patients:  PinkCheek.be  Fact Sheet for Healthcare Providers:  GravelBags.it  This test is no t yet approved or cleared by the [place] FDA and  has been authorized for detection and/or diagnosis of SARS-CoV-2 by FDA under an Emergency Use Authorization (EUA). This EUA will remain  in effect (meaning this test can be used) for the duration of the COVID-19 declaration under Section 564(b)(1) of the Act, 21 U.S.C. section 360bbb-3(b)(1), unless the authorization is terminated or revoked sooner.     Influenza A by PCR NEGATIVE NEGATIVE Final   Influenza B by PCR NEGATIVE NEGATIVE Final    Comment: (NOTE) The Xpert Xpress SARS-CoV-2/FLU/RSV assay is intended as an aid in  the diagnosis of influenza from Nasopharyngeal swab specimens and  should not be used as a sole basis for treatment. Nasal washings and  aspirates are unacceptable for Xpert Xpress SARS-CoV-2/FLU/RSV  testing.  Fact Sheet for Patients: PinkCheek.be  Fact Sheet for Healthcare Providers: GravelBags.it  This test is not yet approved or cleared by the [place] FDA and  has been authorized for detection and/or diagnosis of SARS-CoV-2 by  FDA under an Emergency Use  Authorization (EUA). This EUA will remain  in effect (meaning this test can be used) for the duration of the  Covid-19 declaration under Section 564(b)(1) of the Act, 21  U.S.C. section 360bbb-3(b)(1), unless the authorization is  terminated or revoked. Performed at [facility], [place]  [address] [place], [place] [address]   MRSA PCR Screening     Status: None   Collection Time: 10/25/19  2:50 PM   Specimen: Nasopharyngeal  Result Value Ref Range Status   MRSA by PCR NEGATIVE NEGATIVE Final    Comment:        The GeneXpert MRSA Assay (FDA approved for NASAL specimens only), is one component of a comprehensive MRSA colonization surveillance program. It is not intended to diagnose MRSA infection nor to guide or monitor treatment for MRSA infections. Performed at [facility], [address] [place], [place] [address]   CULTURE, BLOOD (ROUTINE X 2) w Reflex to ID Panel     Status: None (Preliminary result)   Collection Time: 10/25/19  4:29 PM   Specimen: BLOOD  Result Value Ref Range Status   Specimen Description BLOOD CENTRAL LINE  Final   Special Requests   Final    BOTTLES DRAWN AEROBIC AND ANAEROBIC Blood Culture adequate volume   Culture   Final    NO GROWTH 4 DAYS Performed at [facility], [address] [place], [place] [address]    Report Status PENDING  Incomplete  CULTURE, BLOOD (ROUTINE X 2) w Reflex to ID Panel     Status: None (Preliminary result)   Collection Time: 10/26/19  6:45 AM   Specimen: BLOOD  Result Value Ref Range Status   Specimen Description BLOOD BLOOD LEFT HAND  Final   Special Requests   Final    BOTTLES DRAWN AEROBIC AND ANAEROBIC Blood Culture adequate volume   Culture   Final    NO GROWTH 3 DAYS Performed at [facility], [address] [place], [place] [address]    Report Status PENDING  Incomplete  Body fluid culture     Status: None (Preliminary result)   Collection Time: 10/26/19 11:30 AM    Specimen: Fluid  Result Value Ref Range Status   Specimen Description   Final    FLUID Performed at [facility], [address] [place], [place] [address]    Special Requests   Final    PERITONEAL Performed at [facility], [address] [place], [place] [address]    Gram Stain   Final    ABUNDANT WBC PRESENT, PREDOMINANTLY PMN NO ORGANISMS SEEN    Culture   Final    NO GROWTH 3 DAYS Performed at [facility], [place] [address], [place], [place] [address]    Report Status PENDING  Incomplete         Radiology Studies: PERIPHERAL VASCULAR CATHETERIZATION  Result Date: 10/29/2019 See op note  US Paracentesis  Result Date: 10/29/2019 INDICATION: Recurrent ascites, SBP, cirrhosis and end-stage renal disease. EXAM: ULTRASOUND GUIDED PARACENTESIS MEDICATIONS: None. COMPLICATIONS: None immediate. PROCEDURE: Informed written consent was obtained from the patient after a discussion of the risks, benefits and alternatives to treatment. A timeout was performed prior to the initiation of the procedure. Initial ultrasound scanning was performed to localize ascites. The ri left ght lower abdomen was prepped and draped in the usual sterile fashion. 1% lidocaine was used for local anesthesia. Following this, a 6 Fr Safe-T-Centesis catheter was introduced. An ultrasound image was saved for documentation purposes. The paracentesis was performed. The catheter was removed and a dressing was applied. The patient tolerated the procedure well without immediate post procedural complication. FINDINGS: A total of approximately 3.6 L of amber fluid was removed. IMPRESSION: Successful ultrasound-guided paracentesis yielding 3.6 liters of peritoneal fluid.  Electronically Signed   By: Aletta Edouard M.D.   On: 10/29/2019 15:58        Scheduled Meds: . Chlorhexidine Gluconate Cloth  6 each Topical Daily  . furosemide  60 mg Intravenous BID  . mouth rinse  15 mL Mouth Rinse  BID  . pantoprazole (PROTONIX) IV  40 mg Intravenous BID  . polyethylene glycol  17 g Oral Daily  . sodium chloride flush  3 mL Intravenous Q12H   Continuous Infusions: . sodium chloride Stopped (10/26/19 1410)  . albumin human    . cefTRIAXone (ROCEPHIN)  IV 2 g (10/29/19 0920)     LOS: 10 days    Time spent: 28 minutes    Sharen Hones, MD [facility]   To contact the attending provider between 7A-7P or the covering provider during after hours 7P-7A, please log into the web site [email or web address] and access using universal [employer] password for that web site. If you do not have the password, please call the hospital operator.  10/29/2019, 4:46 PM

## 2019-10-29 NOTE — Progress Notes (Addendum)
   10/29/19 1200  Clinical Encounter Type  Visited With Patient  Visit Type Follow-up;Spiritual support  Referral From Chaplain  Consult/Referral To Chaplain  While rounding, chaplain stopped in to see Pt. Pt told chaplain she in back because of fluid build up. Pt told chaplain she was glad to see her and chaplain said seeing her meant she was back in the hospital. When Pt spoke, chaplain was unable to understand her.Chaplain told Pt they need to figure out how to keep her out of the hospital.

## 2019-10-30 DIAGNOSIS — N179 Acute kidney failure, unspecified: Secondary | ICD-10-CM | POA: Diagnosis not present

## 2019-10-30 DIAGNOSIS — I513 Intracardiac thrombosis, not elsewhere classified: Secondary | ICD-10-CM | POA: Diagnosis not present

## 2019-10-30 DIAGNOSIS — J9601 Acute respiratory failure with hypoxia: Secondary | ICD-10-CM | POA: Diagnosis not present

## 2019-10-30 DIAGNOSIS — I5041 Acute combined systolic (congestive) and diastolic (congestive) heart failure: Secondary | ICD-10-CM | POA: Diagnosis not present

## 2019-10-30 LAB — BODY FLUID CULTURE: Culture: NO GROWTH

## 2019-10-30 LAB — BASIC METABOLIC PANEL
Anion gap: 13 (ref 5–15)
BUN: 79 mg/dL — ABNORMAL HIGH (ref 8–23)
CO2: 24 mmol/L (ref 22–32)
Calcium: 9.2 mg/dL (ref 8.9–10.3)
Chloride: 99 mmol/L (ref 98–111)
Creatinine, Ser: 4.16 mg/dL — ABNORMAL HIGH (ref 0.44–1.00)
GFR, Estimated: 11 mL/min — ABNORMAL LOW (ref >60)
Glucose, Bld: 194 mg/dL — ABNORMAL HIGH (ref 70–99)
Potassium: 4 mmol/L (ref 3.5–5.1)
Sodium: 136 mmol/L (ref 135–145)

## 2019-10-30 LAB — CULTURE, BLOOD (ROUTINE X 2)
Culture: NO GROWTH
Special Requests: ADEQUATE

## 2019-10-30 MED ORDER — SODIUM CHLORIDE 0.9 % IV SOLN
300.0000 mg | Freq: Once | INTRAVENOUS | Status: AC
  Administered 2019-10-30: 300 mg via INTRAVENOUS
  Filled 2019-10-30: qty 15

## 2019-10-30 MED ORDER — HALOPERIDOL 1 MG PO TABS: 1.0000 mg | ORAL_TABLET | Freq: Four times a day (QID) | ORAL | Status: DC | PRN
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
## 2019-10-30 NOTE — Progress Notes (Signed)
GI Inpatient Follow-up Note  Subjective:  Patient seen in follow-up for SBP. No acute overnight events. No new complaints. She has had no overt gastrointestinal blood loss. Repeat paracentesis showed cell coutn 3048, 69% neutrophils. She received IV iron this afternoon. She remains on IV Rocephin. No drop in hemoglobin.   Scheduled Inpatient Medications:  . Chlorhexidine Gluconate Cloth  6 each Topical Daily  . furosemide  60 mg Intravenous BID  . mouth rinse  15 mL Mouth Rinse BID  . pantoprazole (PROTONIX) IV  40 mg Intravenous BID  . polyethylene glycol  17 g Oral Daily  . sodium chloride flush  3 mL Intravenous Q12H    Continuous Inpatient Infusions:   . sodium chloride Stopped (10/26/19 1410)  . cefTRIAXone (ROCEPHIN)  IV Stopped (10/30/19 1104)    PRN Inpatient Medications:  sodium chloride, acetaminophen, albuterol, alum & mag hydroxide-simeth **AND** lidocaine, bisacodyl, Glycerin (Adult), HYDROmorphone (DILAUDID) injection, midodrine, ondansetron (ZOFRAN) IV, ondansetron (ZOFRAN) IV, sodium chloride flush  Review of Systems: Constitutional: Weight is stable.  Eyes: No changes in vision. ENT: No oral lesions, sore throat.  GI: see HPI.  Heme/Lymph: No easy bruising.  CV: No chest pain.  GU: No hematuria.  Integumentary: No rashes.  Neuro: No headaches.  Psych: No depression/anxiety.  Endocrine: No heat/cold intolerance.  Allergic/Immunologic: No urticaria.  Resp: No cough, SOB.  Musculoskeletal: No joint swelling.    Physical Examination: BP 109/69 (BP Location: Left Arm)   Pulse 92   Temp 98.2 F (36.8 C) (Oral)   Resp 18   Ht 5\' 6"  (1.676 m)   Wt 102.4 kg   SpO2 98%   BMI 36.44 kg/m  Gen: alert, somewhat confused HEENT: PEERLA, EOMI, Neck: supple, no JVD or thyromegaly Chest: CTA bilaterally, no wheezes, crackles, or other adventitious sounds CV: RRR, no m/g/c/r Abd: soft, ND, +BS in all four quadrants; no tenderness to light or deep palpation in all  four quadrants, no HSM, guarding, ridigity, or rebound tenderness Ext: no edema, well perfused with 2+ pulses, Skin: no rash or lesions noted Lymph: no LAD  Data: Lab Results  Component Value Date   WBC 6.5 10/29/2019   HGB 9.8 (L) 10/29/2019   HCT 29.3 (L) 10/29/2019   MCV 71.3 (L) 10/29/2019   PLT 229 10/29/2019   Recent Labs  Lab 10/27/19 0230 10/28/19 0503 10/29/19 0413  HGB 10.3* 10.0* 9.8*   Lab Results  Component Value Date   NA 136 10/30/2019   K 4.0 10/30/2019   CL 99 10/30/2019   CO2 24 10/30/2019   BUN 79 (H) 10/30/2019   CREATININE 4.16 (H) 10/30/2019   Lab Results  Component Value Date   ALT 8 10/27/2019   AST 14 (L) 10/27/2019   ALKPHOS 68 10/27/2019   BILITOT 1.7 (H) 10/27/2019   Recent Labs  Lab 10/27/19 0230  INR 2.0*   Assessment:  1. SBP - repeat paracentesis yesterday afternoon showed cell count 3048, 69% neutrophils. Culture with predominant PMN, no organisms seen. On IV Rocephin.   2. UGI bleed - historical - Hgb stable at9.8. No hemetemesis or melena.  3. Hypotension - Relative to renal failure, cirrhosis, now concerned about sepsis given peritoneal fluid analysis. General Surgery evaluated and signed off.   4. CHF. Cardiology following. She is receiving IV Lasix BID  5. Polysubtance abuse.   Plan: 1.Cultures with small improvements in cell count, but still consistent with SBP (69% neutrophils).  2. Continue IV Rocephin 2 g daily 3. Continue  IV antibiotics for 48 hours with repeat diagnostic paracentesis to assess response to antibiotics 4. Monitor H/H. Currently hemodynamically stable with no signs of overt GIB 5. Dr. Marius Ditch will be following the patient over the weekend   Please call with questions or concerns.    Octavia Bruckner, PA-C [facility] [phone] (Cell)

## 2019-10-30 NOTE — Progress Notes (Signed)
[facility]  ROUNDING NOTE   Subjective:   Katelyn Lane admitted on 10/19/2019 for Peripheral edema [R60.9] Acute CHF (congestive heart failure) ([place]) [I50.9] AKI (acute kidney injury) ([place]) [N17.9] Congestive heart failure, unspecified HF chronicity, unspecified heart failure type ([place]) [I50.9]  Patient sitting at the side of the bed, in no acute distress.She has her nasal cannula out of her nostrils,respirations unlabored, able to talk in full sentences.She denies worsening SOB,nausea and vomiting.   Objective:  Vital signs in last 24 hours:  Temp:  [97.3 F (36.3 C)-98.2 F (36.8 C)] 98.2 F (36.8 C) (10/22 1146) Pulse Rate:  [90-101] 92 (10/22 1146) Resp:  [18] 18 (10/22 1146) BP: (105-117)/(69-82) 109/69 (10/22 1146) SpO2:  [94 %-100 %] 98 % (10/22 1146) Weight:  [102.4 kg] 102.4 kg (10/22 0400)  Weight change: -6.9 kg Filed Weights   10/28/19 0334 10/29/19 0350 10/30/19 0400  Weight: 108.3 kg 109.3 kg 102.4 kg    Intake/Output: I/O last 3 completed shifts: In: 834.6 [P.O.:240; IV Piggyback:594.6] Out: 5401 [Urine:5400; Stool:1]   Intake/Output this shift:  Total I/O In: 243 [P.O.:240; I.V.:3] Out: 1000 [Urine:1000]  Physical Exam: General:  Well appearing,calm and pleasant  Head  Normocephalic, atraumatic  Lungs:   Diminished at the bases  Heart:  S1S2,no rubs or gallops  Abdomen:   Non tender, Lower Abdominal swelling +  Extremities: 2+ pitting edema on bilateral lower extremities,improving  Neurologic:  Alert,awake,speech clear and appropriate  Skin: No acute lesions or rashes  Rt Femoral Catheter, RT IJ Permcath   Basic Metabolic Panel: Recent Labs  Lab 10/25/19 0018 10/25/19 0421 10/25/19 1119 10/26/19 0645 10/26/19 0645 10/27/19 0230 10/27/19 0230 10/28/19 0503 10/29/19 0413 10/30/19 0550  NA 138   < >  --  139  --  136  --  136 137 136  K 5.4*   < >  --  4.7  --  4.5  --  4.0 4.1 4.0  CL 105   < >  --  105  --  103  --   101 102 99  CO2 23   < >  --  24  --  24  --  25 24 24   GLUCOSE 124*   < >  --  91  --  111*  --  104* 127* 194*  BUN 61*   < >  --  61*  --  66*  --  70* 73* 79*  CREATININE 3.27*   < >  --  3.25*  --  3.67*  --  4.01* 4.13* 4.16*  CALCIUM 9.5   < >  --  9.0   < > 9.0   < > 9.0 8.8* 9.2  MG 3.0*  --   --   --   --   --   --   --   --   --   PHOS  --   --  4.2  --   --   --   --   --   --   --    < > = values in this interval not displayed.    Liver Function Tests: Recent Labs  Lab 10/24/19 0545 10/25/19 0421 10/26/19 0645 10/27/19 0230  AST 25 17 16  14*  ALT 14 10 8 8   ALKPHOS 113 88 73 68  BILITOT 2.3* 2.2* 2.2* 1.7*  PROT 7.0 6.1* 5.4* 5.0*  ALBUMIN 3.2* 3.0* 2.6* 2.4*   Recent Labs  Lab 10/24/19 0545  LIPASE  55*   No results for input(s): AMMONIA in the last 168 hours.  CBC: Recent Labs  Lab 10/25/19 0421 10/26/19 0645 10/27/19 0230 10/28/19 0503 10/29/19 0413  WBC 10.5 10.7* 10.3 7.7 6.5  HGB 13.2 11.4* 10.3* 10.0* 9.8*  HCT 41.6 34.8* 31.4* 30.4* 29.3*  MCV 74.4* 72.7* 73.0* 71.0* 71.3*  PLT 302 260 245 222 229    Cardiac Enzymes: No results for input(s): CKTOTAL, CKMB, CKMBINDEX, TROPONINI in the last 168 hours.  BNP: Invalid input(s): POCBNP  CBG: No results for input(s): GLUCAP in the last 168 hours.  Microbiology: Results for orders placed or performed during the hospital encounter of 10/19/19  Respiratory Panel by RT PCR (Flu A&B, Covid) - Nasopharyngeal Swab     Status: None   Collection Time: 10/19/19 11:13 PM   Specimen: Nasopharyngeal Swab  Result Value Ref Range Status   SARS Coronavirus 2 by RT PCR NEGATIVE NEGATIVE Final    Comment: (NOTE) SARS-CoV-2 target nucleic acids are NOT DETECTED.  The SARS-CoV-2 RNA is generally detectable in upper respiratoy specimens during the acute phase of infection. The lowest concentration of SARS-CoV-2 viral copies this assay can detect is 131 copies/mL. A negative result does not preclude  SARS-Cov-2 infection and should not be used as the sole basis for treatment or other patient management decisions. A negative result may occur with  improper specimen collection/handling, submission of specimen other than nasopharyngeal swab, presence of viral mutation(s) within the areas targeted by this assay, and inadequate number of viral copies (<131 copies/mL). A negative result must be combined with clinical observations, patient history, and epidemiological information. The expected result is Negative.  Fact Sheet for Patients:  PinkCheek.be  Fact Sheet for Healthcare Providers:  GravelBags.it  This test is no t yet approved or cleared by the [place] FDA and  has been authorized for detection and/or diagnosis of SARS-CoV-2 by FDA under an Emergency Use Authorization (EUA). This EUA will remain  in effect (meaning this test can be used) for the duration of the COVID-19 declaration under Section 564(b)(1) of the Act, 21 U.S.C. section 360bbb-3(b)(1), unless the authorization is terminated or revoked sooner.     Influenza A by PCR NEGATIVE NEGATIVE Final   Influenza B by PCR NEGATIVE NEGATIVE Final    Comment: (NOTE) The Xpert Xpress SARS-CoV-2/FLU/RSV assay is intended as an aid in  the diagnosis of influenza from Nasopharyngeal swab specimens and  should not be used as a sole basis for treatment. Nasal washings and  aspirates are unacceptable for Xpert Xpress SARS-CoV-2/FLU/RSV  testing.  Fact Sheet for Patients: PinkCheek.be  Fact Sheet for Healthcare Providers: GravelBags.it  This test is not yet approved or cleared by the [place] FDA and  has been authorized for detection and/or diagnosis of SARS-CoV-2 by  FDA under an Emergency Use Authorization (EUA). This EUA will remain  in effect (meaning this test can be used) for the duration of the   Covid-19 declaration under Section 564(b)(1) of the Act, 21  U.S.C. section 360bbb-3(b)(1), unless the authorization is  terminated or revoked. Performed at [facility], [address] [place], [place] [address]   MRSA PCR Screening     Status: None   Collection Time: 10/25/19  2:50 PM   Specimen: Nasopharyngeal  Result Value Ref Range Status   MRSA by PCR NEGATIVE NEGATIVE Final    Comment:        The GeneXpert MRSA Assay (FDA approved for NASAL specimens only), is one  component of a comprehensive MRSA colonization surveillance program. It is not intended to diagnose MRSA infection nor to guide or monitor treatment for MRSA infections. Performed at [facility], [address] [place], [place] [address]   CULTURE, BLOOD (ROUTINE X 2) w Reflex to ID Panel     Status: None   Collection Time: 10/25/19  4:29 PM   Specimen: BLOOD  Result Value Ref Range Status   Specimen Description BLOOD CENTRAL LINE  Final   Special Requests   Final    BOTTLES DRAWN AEROBIC AND ANAEROBIC Blood Culture adequate volume   Culture   Final    NO GROWTH 5 DAYS Performed at [facility], [address] [place], [place] [address]    Report Status 10/30/2019 FINAL  Final  CULTURE, BLOOD (ROUTINE X 2) w Reflex to ID Panel     Status: None (Preliminary result)   Collection Time: 10/26/19  6:45 AM   Specimen: BLOOD  Result Value Ref Range Status   Specimen Description BLOOD BLOOD LEFT HAND  Final   Special Requests   Final    BOTTLES DRAWN AEROBIC AND ANAEROBIC Blood Culture adequate volume   Culture   Final    NO GROWTH 4 DAYS Performed at [facility], [address], [place], [place] [address]    Report Status PENDING  Incomplete  Body fluid culture     Status: None   Collection Time: 10/26/19 11:30 AM   Specimen: Fluid  Result Value Ref Range Status   Specimen Description   Final    FLUID Performed at [facility], [address] [place], [place] [address]    Special Requests   Final    PERITONEAL Performed at [facility], [address] [place], [place] [address]    Gram Stain   Final    ABUNDANT WBC PRESENT, PREDOMINANTLY PMN NO ORGANISMS SEEN    Culture   Final    NO GROWTH Performed at [facility], [place] [address], [place], [place] [address]    Report Status 10/30/2019 FINAL  Final  Body fluid culture     Status: None (Preliminary result)   Collection Time: 10/29/19  1:39 PM   Specimen: PATH Cytology Peritoneal fluid  Result Value Ref Range Status   Specimen Description   Final    PERITONEAL Performed at [facility], [address], [place], [place] [address]    Special Requests   Final    NONE Performed at [facility], [address] [place], [place] [address]    Gram Stain   Final    MODERATE WBC PRESENT, PREDOMINANTLY PMN NO ORGANISMS SEEN Performed at [facility], [place] [address], [place], [place] [address]    Culture PENDING  Incomplete   Report Status PENDING  Incomplete    Coagulation Studies: No results for input(s): LABPROT, INR in the last 72 hours.  Urinalysis: No results for input(s): COLORURINE, LABSPEC, PHURINE, GLUCOSEU, HGBUR, BILIRUBINUR, KETONESUR, PROTEINUR, UROBILINOGEN, NITRITE, LEUKOCYTESUR in the last 72 hours.  Invalid input(s): APPERANCEUR    Imaging: PERIPHERAL VASCULAR CATHETERIZATION  Result Date: 10/29/2019 See op note  US Paracentesis  Result Date: 10/29/2019 INDICATION: Recurrent ascites, SBP, cirrhosis and end-stage renal disease. EXAM: ULTRASOUND GUIDED PARACENTESIS MEDICATIONS: None. COMPLICATIONS: None immediate. PROCEDURE: Informed written consent was obtained from the patient after a discussion of the risks, benefits and alternatives to treatment. A timeout was performed prior to the initiation of the procedure.  Initial ultrasound scanning was performed to localize ascites. The ri left ght lower abdomen  was prepped and draped in the usual sterile fashion. 1% lidocaine was used for local anesthesia. Following this, a 6 Fr Safe-T-Centesis catheter was introduced. An ultrasound image was saved for documentation purposes. The paracentesis was performed. The catheter was removed and a dressing was applied. The patient tolerated the procedure well without immediate post procedural complication. FINDINGS: A total of approximately 3.6 L of amber fluid was removed. IMPRESSION: Successful ultrasound-guided paracentesis yielding 3.6 liters of peritoneal fluid. Electronically Signed   By: Aletta Edouard M.D.   On: 10/29/2019 15:58     Medications:    sodium chloride Stopped (10/26/19 1410)   cefTRIAXone (ROCEPHIN)  IV 2 g (10/30/19 1034)    Chlorhexidine Gluconate Cloth  6 each Topical Daily   furosemide  60 mg Intravenous BID   mouth rinse  15 mL Mouth Rinse BID   pantoprazole (PROTONIX) IV  40 mg Intravenous BID   polyethylene glycol  17 g Oral Daily   sodium chloride flush  3 mL Intravenous Q12H   sodium chloride, acetaminophen, albuterol, alum & mag hydroxide-simeth **AND** lidocaine, bisacodyl, Glycerin (Adult), HYDROmorphone (DILAUDID) injection, midodrine, ondansetron (ZOFRAN) IV, ondansetron (ZOFRAN) IV, sodium chloride flush  Assessment/ Plan:  Katelyn Lane is a 64 y.o. black female with congestive heart failure, hypertension, hyperlipidemia, gout, COPD, anemia, cocaine abuse who is admitted to [facility] on 10/19/2019 for Peripheral edema [R60.9] Acute CHF (congestive heart failure) ([place]) [I50.9] AKI (acute kidney injury) ([place]) [N17.9] Congestive heart failure, unspecified HF chronicity, unspecified heart failure type ([place]) [I50.9]  1. Acute kidney injury on chronic kidney disease stage IIIB. Baseline creatinine of 1.66, GFR of 32. With history of proteinuria.  Chronic kidney disease secondary to hypertension  Acute renal failure secondary to acute cardiorenal syndrome  -Patient  received  Dialysis with UF only session on 10/26/19 , with 1.5L fluid removal. Lab Results  Component Value Date   CREATININE 4.16 (H) 10/30/2019   CREATININE 4.13 (H) 10/29/2019   CREATININE 4.01 (H) 10/28/2019   Urine Output for the preceding 24 hours is 3700 ml -No plan for dialysis today -RN instructed to discontinue Rt.femoral catheter as patient has Rt.IJ access. -We will assess renal function and volume status daily   2. Hypotension with acute exacerbation of systolic and diastolic congestive heart failure. Echocardiogram 06/22/19 EF of 25-30%.  -on 3 L nasal cannula, patient keeps it  as needed -Blood pressure readings stays at the low normal range -Continue Furosemide 60 mg BID  3. Ascites/ Bacterial Peritonitis Patient is on IV antibiotics,Rocephin and Flagyl Gastroenterology team managing       LOS: [address] Kately[address] 10/22/202112:24 PM

## 2019-10-30 NOTE — Progress Notes (Signed)
Per dr. Candiss Norse remove right temporary trialysis catheter

## 2019-10-30 NOTE — Progress Notes (Signed)
Patient was placed in supine position. Sterile techniques used to remove sutures from right groin. Patient was instructed to hold breath and push down with removal of catheter. Catheter tip is intact.Vaseline gauze and 4x4 sterile gauzed used, pressure was held for 10 minutes. After pressure was held no bleeding was noted. tegaderm gauze was placed over the Vaseline gauze and 4x4. Patient instructed to lay flat for 30 minutes and to keep an eye on site and call if any bleeding. Will continue to monitor and check on patient for bleeding.

## 2019-10-30 NOTE — Progress Notes (Signed)
PROGRESS NOTE    Katelyn Lane  ZDG:[id number] DOB: 06-13-1955 DOA: 10/19/2019 PCP: [name], MD   Chief complaint.  Abdominal pain. 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 Found to have BNP greater than 4500, elevated troponin and AKI.  Multiple prior admissions with similar symptoms and complaints. Prolonged hospitalization in August 2021 when she was discharged to SNF. Not clear when she get out of SNF. Continues to complain that she is homeless but per social worker she do have a house. Not take her medication despite being provided with medical management.  After admission to the hospital, she was diagnosed with acute on chronic combined systolicanddiastoliccongestiveheartfailure with ejection fraction 25 to 30%. She was not responding to IV Lasix. She was seen by nephrology, temporary dialysis catheter was performed and she was dialyzed on 10/17 and 10/18. Patient also complaining of abdominal pain, she had a paracentesis, study showed possible spontaneous peritonitis, she was treated with Rocephin and Flagyl.  Patient had a worsening respiratory status on 10/17, was diagnosed with aspiration pneumonia. She was treated with Rocephin, Zithromax and Flagyl.  10/21.  Repeat paracentesis performed, antibiotic changed to Rocephin only.  Permacath placed.  10/22.  Repeated paracentesis showed white cell count of 3048, 69% neutrophils.  However, patient clinically improving.  Continue Rocephin.   Assessment & Plan:   Principal Problem:   Dementia ([place]) Active Problems:   AKI (acute kidney injury) ([place])   Crack cocaine use   Acute respiratory failure with hypoxia ([facility])    LV (left ventricular) mural thrombus   Acute CHF (congestive heart failure) ([facility])   Cocaine abuse ([place])   Peripheral edema   Cirrhosis of liver ([place])  #1.  Spontaneous bacterial peritonitis. Repeat paracentesis still has significant white cells, culture has been negative.  However, patient clinically improving.  Continue Rocephin at high dose.  2.  Possible GI bleed. Hemoglobin has been stable, continue IV Protonix.  3.  Acute on chronic combined systolic and diastolic congestive heart failure, acute hypoxemic respiratory failure. Condition is improving.  4.  Nonsustained ventricular tachycardia. No recurrence.  5.  Aspiration pneumonia. Still on Rocephin.  6.  Liver cirrhosis with ascites. Repeated paracentesis was performed yesterday.  7.  History of a left ventricular thrombus and history of CVA. I will continue to hold anticoagulation.  If by tomorrow hemoglobin still stable, I will resume anticoagulation.  8.  Acute renal failure on chronic kidney stage IIIb. Patient has a permacath placed, followed by nephrology for hemodialysis.  9.  Anemia of chronic disease with iron deficient anemia. I will give a dose of IV iron.  B12 level normal.   DVT prophylaxis: SCDs Code Status: Full Family Communication: None Disposition Plan:  .   Status is: Inpatient  Remains inpatient appropriate because:Inpatient level of care appropriate due to severity of illness Patient is not competent for medical decision making, however, she is agreeable to go to nursing home.  Currently social work is working on [employer] placement.  Dispo: The patient is from: Home              Anticipated d/c is to: SNF              Anticipated d/c date is: > 3 days  Patient currently is not medically stable to d/c.        I/O last 3 completed shifts: In: 834.6 [P.O.:240; IV Piggyback:594.6] Out: 5401 [Urine:5400; Stool:1] Total I/O In: 243 [P.O.:240; I.V.:3] Out: 1000 [Urine:1000]      Consultants:   Nephrology  Procedures: Permcath  Antimicrobials: Rocephin  Subjective: Patient feels better today.  Abdominal pain much improved.  No nausea vomiting.  No diarrhea constipation. No fever chills. No shortness of breath or cough. No headache or dizziness.   Objective: Vitals:   10/30/19 0313 10/30/19 0400 10/30/19 0823 10/30/19 1146  BP: 117/82  114/77 109/69  Pulse: 96  (!) 101 92  Resp: 18  18 18   Temp: 97.6 F (36.4 C)  (!) 97.3 F (36.3 C) 98.2 F (36.8 C)  TempSrc: Oral  Oral Oral  SpO2: 94%  95% 98%  Weight:  102.4 kg    Height:        Intake/Output Summary (Last 24 hours) at 10/30/2019 1340 Last data filed at 10/30/2019 1223 Gross per 24 hour  Intake 243 ml  Output 4700 ml  Net -4457 ml   Filed Weights   10/28/19 0334 10/29/19 0350 10/30/19 0400  Weight: 108.3 kg 109.3 kg 102.4 kg    Examination:  General exam: Appears calm and comfortable  Respiratory system: Clear to auscultation. Respiratory effort normal. Cardiovascular system: S1 & S2 heard, RRR. No JVD, murmurs, rubs, gallops or clicks. No pedal edema. Gastrointestinal system: Abdomen is nondistended, soft and nontender. No organomegaly or masses felt. Normal bowel sounds heard. Central nervous system: Alert and oriented x2. No focal neurological deficits. Extremities: Symmetric  Skin: No rashes, lesions or ulcers Psychiatry:  Mood & affect appropriate.     Data Reviewed: I have personally reviewed following labs and imaging studies  CBC: Recent Labs  Lab 10/25/19 0421 10/26/19 0645 10/27/19 0230 10/28/19 0503 10/29/19 0413  WBC 10.5 10.7* 10.3 7.7 6.5  HGB 13.2 11.4* 10.3* 10.0* 9.8*  HCT 41.6 34.8* 31.4* 30.4* 29.3*  MCV 74.4* 72.7* 73.0* 71.0* 71.3*  PLT 302 260 245 222 160   Basic Metabolic Panel: Recent Labs  Lab 10/25/19 0018 10/25/19 0421 10/25/19 1119 10/26/19 0645 10/27/19 0230 10/28/19 0503 10/29/19 0413 10/30/19 0550  NA 138   < >  --  139  136 136 137 136  K 5.4*   < >  --  4.7 4.5 4.0 4.1 4.0  CL 105   < >  --  105 103 101 102 99  CO2 23   < >  --  24 24 25 24 24   GLUCOSE 124*   < >  --  91 111* 104* 127* 194*  BUN 61*   < >  --  61* 66* 70* 73* 79*  CREATININE 3.27*   < >  --  3.25* 3.67* 4.01* 4.13* 4.16*  CALCIUM 9.5   < >  --  9.0 9.0 9.0 8.8* 9.2  MG 3.0*  --   --   --   --   --   --   --   PHOS  --   --  4.2  --   --   --   --   --    < > = values in this interval not displayed.   GFR: Estimated Creatinine Clearance: 16.7 mL/min (A) (by C-G formula based on SCr of 4.16 mg/dL (H)). Liver Function Tests: Recent Labs  Lab 10/24/19 0545 10/25/19 0421 10/26/19 0645 10/27/19 0230  AST 25  17 16 14*  ALT 14 10 8 8   ALKPHOS 113 88 73 68  BILITOT 2.3* 2.2* 2.2* 1.7*  PROT 7.0 6.1* 5.4* 5.0*  ALBUMIN 3.2* 3.0* 2.6* 2.4*   Recent Labs  Lab 10/24/19 0545  LIPASE 55*   No results for input(s): AMMONIA in the last 168 hours. Coagulation Profile: Recent Labs  Lab 10/27/19 0230  INR 2.0*   Cardiac Enzymes: No results for input(s): CKTOTAL, CKMB, CKMBINDEX, TROPONINI in the last 168 hours. BNP (last 3 results) No results for input(s): PROBNP in the last 8760 hours. HbA1C: No results for input(s): HGBA1C in the last 72 hours. CBG: No results for input(s): GLUCAP in the last 168 hours. Lipid Profile: No results for input(s): CHOL, HDL, LDLCALC, TRIG, CHOLHDL, LDLDIRECT in the last 72 hours. Thyroid Function Tests: No results for input(s): TSH, T4TOTAL, FREET4, T3FREE, THYROIDAB in the last 72 hours. Anemia Panel: No results for input(s): VITAMINB12, FOLATE, FERRITIN, TIBC, IRON, RETICCTPCT in the last 72 hours. Sepsis Labs: Recent Labs  Lab 10/25/19 0421 10/27/19 0320  PROCALCITON 14.74  --   LATICACIDVEN  --  1.0    Recent Results (from the past 240 hour(s))  MRSA PCR Screening     Status: None   Collection Time: 10/25/19  2:50 PM   Specimen: Nasopharyngeal  Result Value Ref Range Status   MRSA  by PCR NEGATIVE NEGATIVE Final    Comment:        The GeneXpert MRSA Assay (FDA approved for NASAL specimens only), is one component of a comprehensive MRSA colonization surveillance program. It is not intended to diagnose MRSA infection nor to guide or monitor treatment for MRSA infections. Performed at [facility], [address] [place], [place] [address]   CULTURE, BLOOD (ROUTINE X 2) w Reflex to ID Panel     Status: None   Collection Time: 10/25/19  4:29 PM   Specimen: BLOOD  Result Value Ref Range Status   Specimen Description BLOOD CENTRAL LINE  Final   Special Requests   Final    BOTTLES DRAWN AEROBIC AND ANAEROBIC Blood Culture adequate volume   Culture   Final    NO GROWTH 5 DAYS Performed at [facility], [address] [place], [place] [address]    Report Status 10/30/2019 FINAL  Final  CULTURE, BLOOD (ROUTINE X 2) w Reflex to ID Panel     Status: None (Preliminary result)   Collection Time: 10/26/19  6:45 AM   Specimen: BLOOD  Result Value Ref Range Status   Specimen Description BLOOD BLOOD LEFT HAND  Final   Special Requests   Final    BOTTLES DRAWN AEROBIC AND ANAEROBIC Blood Culture adequate volume   Culture   Final    NO GROWTH 4 DAYS Performed at [facility], [address], [place], [place] [address]    Report Status PENDING  Incomplete  Body fluid culture     Status: None   Collection Time: 10/26/19 11:30 AM   Specimen: Fluid  Result Value Ref Range Status   Specimen Description   Final    FLUID Performed at [facility], [address] [place], [place] [address]    Special Requests   Final    PERITONEAL Performed at [facility], [address] [place], [place] [address]    Gram Stain   Final    ABUNDANT WBC PRESENT, PREDOMINANTLY PMN NO ORGANISMS SEEN    Culture   Final  NO GROWTH Performed at [facility], [place] [address] [place], [place] [address]    Report Status  10/30/2019 FINAL  Final  Body fluid culture     Status: None (Preliminary result)   Collection Time: 10/29/19  1:39 PM   Specimen: PATH Cytology Peritoneal fluid  Result Value Ref Range Status   Specimen Description   Final    PERITONEAL Performed at [facility], [address], [place], [place] [address]    Special Requests   Final    NONE Performed at [facility], [address] [place], [place] [address]    Gram Stain   Final    MODERATE WBC PRESENT, PREDOMINANTLY PMN NO ORGANISMS SEEN Performed at [facility], [place] [address], [place], [place] [address]    Culture PENDING  Incomplete   Report Status PENDING  Incomplete         Radiology Studies: PERIPHERAL VASCULAR CATHETERIZATION  Result Date: 10/29/2019 See op note  US Paracentesis  Result Date: 10/29/2019 INDICATION: Recurrent ascites, SBP, cirrhosis and end-stage renal disease. EXAM: ULTRASOUND GUIDED PARACENTESIS MEDICATIONS: None. COMPLICATIONS: None immediate. PROCEDURE: Informed written consent was obtained from the patient after a discussion of the risks, benefits and alternatives to treatment. A timeout was performed prior to the initiation of the procedure. Initial ultrasound scanning was performed to localize ascites. The ri left ght lower abdomen was prepped and draped in the usual sterile fashion. 1% lidocaine was used for local anesthesia. Following this, a 6 Fr Safe-T-Centesis catheter was introduced. An ultrasound image was saved for documentation purposes. The paracentesis was performed. The catheter was removed and a dressing was applied. The patient tolerated the procedure well without immediate post procedural complication. FINDINGS: A total of approximately 3.6 L of amber fluid was removed. IMPRESSION: Successful ultrasound-guided paracentesis yielding 3.6 liters of peritoneal fluid. Electronically Signed   By: Aletta Edouard M.D.   On: 10/29/2019 15:58        Scheduled  Meds: . Chlorhexidine Gluconate Cloth  6 each Topical Daily  . furosemide  60 mg Intravenous BID  . mouth rinse  15 mL Mouth Rinse BID  . pantoprazole (PROTONIX) IV  40 mg Intravenous BID  . polyethylene glycol  17 g Oral Daily  . sodium chloride flush  3 mL Intravenous Q12H   Continuous Infusions: . sodium chloride Stopped (10/26/19 1410)  . cefTRIAXone (ROCEPHIN)  IV 2 g (10/30/19 1034)     LOS: 11 days    Time spent: 35 minutes    Sharen Hones, MD [facility]   To contact the attending provider between 7A-7P or the covering provider during after hours 7P-7A, please log into the web site [email or web address] and access using universal [place] password for that web site. If you do not have the password, please call the hospital operator.  10/30/2019, 1:40 PM

## 2019-10-30 NOTE — Progress Notes (Signed)
Pt admitted with combined diastolic and systolic chf.and history of persistent cocaine use. EF 25-30%. Had abdominal pain and underwent paracentesis for ascites. On abx. On iv furosamide. Has had temp HD. Creast 4.13. Not candidate for afterload reduction due to soft blood pressure.  Will continue with iv.   Dr. Nehemiah Massed on call for [facility] cardiology this weekend.

## 2019-10-30 NOTE — Progress Notes (Signed)
[facility] Encounter Note  Patient: Katelyn Lane / Admit Date: 10/19/2019 / Date of Encounter: 10/30/2019, 4:40 PM   Subjective: Patient overall has had no evidence of significant complaints today.  The patient still has 1-2+ edema and some basilar crackles but no complaints of other issues.  The patient's blood pressure has been low without ability to add any medication management for congestive heart failure and requires occasional midodrine as well.  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  There has been no other significant concerns today  Review of Systems: Positive for: Weakness Negative for: Vision change, hearing change, syncope, dizziness, nausea, vomiting,diarrhea, bloody stool, stomach pain, cough, congestion, diaphoresis, urinary frequency, urinary pain,skin lesions, skin rashes Others previously listed  Objective: Telemetry: Normal sinus rhythm Physical Exam: Blood pressure 109/69, pulse 92, temperature 98.2 F (36.8 C), temperature source Oral, resp. rate 18, height 5\' 6"  (1.676 m), weight 102.4 kg, SpO2 98 %. Body mass index is 36.44 kg/m. General: Well developed, well nourished, in no acute distress. Head: Normocephalic, atraumatic, sclera non-icteric, no xanthomas, nares are without discharge. Neck: No apparent masses Lungs: Normal respirations with no wheezes, few rhonchi, no rales , basilar crackles   Heart: Regular rate and rhythm, normal S1 S2, no murmur, no rub, no gallop, PMI is normal size and placement, carotid upstroke normal without bruit, jugular venous pressure normal Abdomen: Soft, non-tender, non-distended with normoactive bowel sounds. No hepatosplenomegaly. Abdominal aorta is normal size without bruit Extremities: 1+ edema, no clubbing, no cyanosis, no ulcers,  Peripheral: 2+ radial, 2+ femoral, 2+ dorsal pedal pulses Neuro: Alert and  oriented. Moves all extremities spontaneously. Psych:  Responds to questions appropriately with a normal affect.   Intake/Output Summary (Last 24 hours) at 10/30/2019 1640 Last data filed at 10/30/2019 1525 Gross per 24 hour  Intake 603 ml  Output 4300 ml  Net -3697 ml    Inpatient Medications:  . Chlorhexidine Gluconate Cloth  6 each Topical Daily  . furosemide  60 mg Intravenous BID  . mouth rinse  15 mL Mouth Rinse BID  . pantoprazole (PROTONIX) IV  40 mg Intravenous BID  . polyethylene glycol  17 g Oral Daily  . sodium chloride flush  3 mL Intravenous Q12H   Infusions:  . sodium chloride Stopped (10/26/19 1410)  . cefTRIAXone (ROCEPHIN)  IV 2 g (10/30/19 1034)  . iron sucrose 300 mg (10/30/19 1514)    Labs: Recent Labs    10/29/19 0413 10/30/19 0550  NA 137 136  K 4.1 4.0  CL 102 99  CO2 24 24  GLUCOSE 127* 194*  BUN 73* 79*  CREATININE 4.13* 4.16*  CALCIUM 8.8* 9.2   No results for input(s): AST, ALT, ALKPHOS, BILITOT, PROT, ALBUMIN in the last 72 hours. Recent Labs    10/28/19 0503 10/29/19 0413  WBC 7.7 6.5  HGB 10.0* 9.8*  HCT 30.4* 29.3*  MCV 71.0* 71.3*  PLT 222 229   No results for input(s): CKTOTAL, CKMB, TROPONINI in the last 72 hours. Invalid input(s): POCBNP No results for input(s): HGBA1C in the last 72 hours.   Weights: Filed Weights   10/28/19 0334 10/29/19 0350 10/30/19 0400  Weight: 108.3 kg 109.3 kg 102.4 kg     Radiology/Studies:  CT ABDOMEN PELVIS WO CONTRAST  Result Date: 10/27/2019 CLINICAL DATA:  64 year old female with abdominal pain. Concern for peritonitis. EXAM: CT ABDOMEN AND PELVIS WITHOUT CONTRAST TECHNIQUE: Multidetector CT  imaging of the abdomen and pelvis was performed following the standard protocol without IV contrast. COMPARISON:  CT abdomen pelvis dated 08/15/2019. FINDINGS: Evaluation of this exam is limited in the absence of intravenous contrast. Lower chest: Partially visualized small bilateral pleural  effusions. There is partial consolidative changes of the right lower lobe and majority of the visualized left lower lobe which may represent atelectasis or pneumonia. Clinical correlation is recommended. There is moderate cardiomegaly. Coronary vascular calcifications noted. There is hypoattenuation of the cardiac blood pool suggestive of anemia. Clinical correlation is recommended. Moderate ascites, significantly increased since the prior CT. Hepatobiliary: Slight irregularity of the liver contour may represent early changes of cirrhosis. Clinical correlation is recommended. No intrahepatic biliary ductal dilatation. The gallbladder is predominantly contracted. Pancreas: The pancreas is grossly unremarkable. Spleen: Normal in size without focal abnormality. Adrenals/Urinary Tract: The adrenal glands are unremarkable. There is no hydronephrosis or nephrolithiasis on either side. The urinary bladder is decompressed around a Foley catheter. Stomach/Bowel: There is air within the wall of the gastric fundus consistent with pneumatosis and most concerning for bowel ischemia. Two small pockets of air in the periportal region and along the falciform ligament (35/2) most concerning for extraluminal air, possibly intravascular. Correlation with clinical exam and lactic acid levels recommended. Mildly dilated small bowel loops measure up to 4 cm in caliber. The distal small bowel are collapsed. A transition may be present in the midline anterior abdomen (63/2. Evaluation of the bowel is very limited due to anasarca. There is sigmoid diverticulosis without active inflammatory changes. The appendix is not identified. Vascular/Lymphatic: Mild aortoiliac atherosclerotic disease. The IVC is unremarkable. Right femoral central venous line with tip in the distal IVC. Reproductive: Hysterectomy. Other: Severe diffuse subcutaneous edema and anasarca. Musculoskeletal: Degenerative changes of the spine. No acute osseous pathology.  IMPRESSION: 1. Findings most concerning for gastric pneumatosis with small pockets of air likely in the portal vein suggestive of bowel ischemia. Correlation with clinical exam and lactic acid levels recommended. 2. Findings of bowel obstruction with transition in the midline abdomen. 3. Sigmoid diverticulosis. 4. Moderate ascites, significantly increased since the prior CT. 5. Severe diffuse subcutaneous edema and anasarca. 6. Partially visualized small bilateral pleural effusions with partial consolidative changes of the visualized lower lobes which may represent atelectasis or pneumonia. Clinical correlation is recommended. 7. Aortic Atherosclerosis (ICD10-I70.0). These results were called by telephone at the time of interpretation on 10/27/2019 at 12:52 am to Nurse Dossie Der, who verbally acknowledged these results. Electronically Signed   By: Anner Crete M.D.   On: 10/27/2019 01:05   DG Chest 2 View  Result Date: 10/19/2019 CLINICAL DATA:  Shortness of breath. EXAM: CHEST - 2 VIEW COMPARISON:  08/27/2019 and chest CT 08/15/2019 FINDINGS: Stable cardiomegaly. No overt pulmonary edema. Difficult to exclude densities at the left lung base and small left pleural effusion. Negative for a pneumothorax. IMPRESSION: Stable cardiomegaly with probable left basilar chest densities. Difficult to exclude a small left pleural effusion. Minimal change from the exam on 08/27/2019. Electronically Signed   By: Markus Daft M.D.   On: 10/19/2019 09:42   DG Abd 1 View  Result Date: 10/26/2019 CLINICAL DATA:  Abdominal pain and recent paracentesis EXAM: ABDOMEN - 1 VIEW COMPARISON:  10/24/2019 FINDINGS: Scattered large and small bowel gas is noted although predominately centrally which suggests some persistent ascites despite recent paracentesis. Dialysis catheter is noted in the right femoral vein extending into the IVC. This is new from the prior exam. No bony  abnormality is seen. No obstructive changes are  noted. IMPRESSION: Changes suggestive of mild residual ascites. No other focal acute abnormality is noted. Electronically Signed   By: Inez Catalina M.D.   On: 10/26/2019 18:59   US Abdomen Complete  Result Date: 10/24/2019 CLINICAL DATA:  Abdominal pain. Mildly elevated bilirubin and lipase 4 months. EXAM: ABDOMEN ULTRASOUND COMPLETE COMPARISON:  None. FINDINGS: Gallbladder: The gallbladder wall is thickened measuring up to 5.5 mm. No stones, sludge, or Murphy's sign identified. Common bile duct: Diameter: 2.9 mm Liver: Diffuse coarsened echotexture with a nodular contour. No focal mass. Portal vein is patent on color Doppler imaging with normal direction of blood flow towards the liver. IVC: No abnormality visualized. Pancreas: Limited visualization due to bowel gas shadowing. Limited views of the pancreas are normal. Spleen: Size and appearance within normal limits. Right Kidney: Length: 9.7 cm. Contains 2 probable small cysts measuring 1.7 and 1.4 cm. Left Kidney: Length: 10.3 cm. Echogenicity within normal limits. No mass or hydronephrosis visualized. Abdominal aorta: 3.1 cm proximally Other findings: Ascites is seen in all 4 quadrants. Evaluation is limited due to labored breathing. IMPRESSION: 1. The gallbladder wall is thickened which is nonspecific given apparent signs of cirrhosis and obvious ascites. The gallbladder is otherwise unremarkable. If there is concern for acute cholecystitis, recommend HIDA scan. 2. 4 quadrant ascites, moderate. 3. Coarsened echotexture to the liver with a nodular contour suggesting cirrhosis. 4. Mild prominence of the proximal abdominal aorta measuring 3.1 cm. 5. Small cyst in the right kidney. Electronically Signed   By: Dorise Bullion III M.D   On: 10/24/2019 10:28   US RENAL  Result Date: 10/20/2019 CLINICAL DATA:  64 year old female with acute renal failure. EXAM: RENAL / URINARY TRACT ULTRASOUND COMPLETE COMPARISON:  CT abdomen pelvis dated 08/15/2019. FINDINGS:  Right Kidney: Renal measurements: 10.9 x 4.6 x 4.9 cm = volume: 127 mL. There is mild diffuse increased echogenicity. No hydronephrosis or shadowing stone. There is a 1.7 x 1.1 x 1.1 cm lateral interpolar cyst. Left Kidney: Renal measurements: 10.1 x 4.8 x 5.3 cm = volume: 132 mL. Mild increased echogenicity. No hydronephrosis or shadowing stone. Bladder: The bladder is partially visualized. Other: There is a small ascites and anasarca. IMPRESSION: 1. Increased renal parenchymal echogenicity in keeping with chronic kidney disease. No hydronephrosis or shadowing stone. 2. Small ascites. Electronically Signed   By: Anner Crete M.D.   On: 10/20/2019 17:59   PERIPHERAL VASCULAR CATHETERIZATION  Result Date: 10/29/2019 See op note  US Paracentesis  Result Date: 10/29/2019 INDICATION: Recurrent ascites, SBP, cirrhosis and end-stage renal disease. EXAM: ULTRASOUND GUIDED PARACENTESIS MEDICATIONS: None. COMPLICATIONS: None immediate. PROCEDURE: Informed written consent was obtained from the patient after a discussion of the risks, benefits and alternatives to treatment. A timeout was performed prior to the initiation of the procedure. Initial ultrasound scanning was performed to localize ascites. The ri left ght lower abdomen was prepped and draped in the usual sterile fashion. 1% lidocaine was used for local anesthesia. Following this, a 6 Fr Safe-T-Centesis catheter was introduced. An ultrasound image was saved for documentation purposes. The paracentesis was performed. The catheter was removed and a dressing was applied. The patient tolerated the procedure well without immediate post procedural complication. FINDINGS: A total of approximately 3.6 L of amber fluid was removed. IMPRESSION: Successful ultrasound-guided paracentesis yielding 3.6 liters of peritoneal fluid. Electronically Signed   By: Aletta Edouard M.D.   On: 10/29/2019 15:58   US Paracentesis  Result Date: 10/26/2019  INDICATION:  Ascites, distension EXAM: ULTRASOUND GUIDED DIAGNOSTIC AND THERAPEUTIC PARACENTESIS MEDICATIONS: 1% LIDOCAINE LOCAL COMPLICATIONS: None immediate. PROCEDURE: Informed written consent was obtained from the patient's sister by phone and verified by ultrasound staff after a discussion of the risks, benefits and alternatives to treatment. A timeout was performed prior to the initiation of the procedure. Initial ultrasound scanning demonstrates a large amount of ascites within the left lower abdominal quadrant. The left lower abdomen was prepped and draped in the usual sterile fashion. 1% lidocaine was used for local anesthesia. Following this, a 6 Fr Safe-T-Centesis catheter was introduced. An ultrasound image was saved for documentation purposes. The paracentesis was performed. The catheter was removed and a dressing was applied. The patient tolerated the procedure well without immediate post procedural complication. FINDINGS: A total of approximately 6.4 L of yellow peritoneal fluid was removed. Samples were sent to the laboratory as requested by the clinical team. IMPRESSION: Successful ultrasound-guided paracentesis yielding 6.4 liters of peritoneal fluid. Electronically Signed   By: Jerilynn Mages.  Shick M.D.   On: 10/26/2019 12:36   DG Chest Port 1 View  Result Date: 10/24/2019 CLINICAL DATA:  64 year old female admitted with heart failure. EXAM: PORTABLE CHEST 1 VIEW COMPARISON:  10/19/2019 FINDINGS: Marked cardiomegaly, unchanged. Cephalization of the pulmonary vasculature. Streaky bibasilar pulmonary opacities. Blunting of left costophrenic angle. No pneumothorax. No acute osseous abnormality. IMPRESSION: Mild pulmonary edema and probable trace left pleural effusion. Stable cardiomegaly. Electronically Signed   By: Ruthann Cancer MD   On: 10/24/2019 12:00   DG ABD ACUTE 2+V W 1V CHEST  Result Date: 10/25/2019 CLINICAL DATA:  Shortness of breath EXAM: DG ABDOMEN ACUTE W/ 1V CHEST COMPARISON:  02/21/2019 FINDINGS:  Shallow lung inflation with mild cardiomegaly and left basilar consolidation. Bowel gas pattern is nonobstructive. No free intraperitoneal air. IMPRESSION: Shallow lung inflation with left basilar consolidation, which may indicate atelectasis or pneumonia. Electronically Signed   By: Ulyses Jarred M.D.   On: 10/25/2019 00:21     Assessment and Recommendation  64 y.o. 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  Continue intravenous Lasix twice per day for acute on chronic systolic dysfunction congestive heart failure 2.  No additional medication management due to significant hypotension and other side effects of medication 3.  Continue social work evaluation for possible placement due to recurrent episodes of hospital admissions  Signed, Serafina Royals M.D. FACC

## 2019-10-31 DIAGNOSIS — K652 Spontaneous bacterial peritonitis: Secondary | ICD-10-CM | POA: Diagnosis not present

## 2019-10-31 DIAGNOSIS — K7469 Other cirrhosis of liver: Secondary | ICD-10-CM

## 2019-10-31 DIAGNOSIS — J9601 Acute respiratory failure with hypoxia: Secondary | ICD-10-CM | POA: Diagnosis not present

## 2019-10-31 DIAGNOSIS — N179 Acute kidney failure, unspecified: Secondary | ICD-10-CM | POA: Diagnosis not present

## 2019-10-31 DIAGNOSIS — I5041 Acute combined systolic (congestive) and diastolic (congestive) heart failure: Secondary | ICD-10-CM | POA: Diagnosis not present

## 2019-10-31 LAB — BASIC METABOLIC PANEL
Anion gap: 10 (ref 5–15)
BUN: 77 mg/dL — ABNORMAL HIGH (ref 8–23)
CO2: 27 mmol/L (ref 22–32)
Calcium: 9.3 mg/dL (ref 8.9–10.3)
Chloride: 100 mmol/L (ref 98–111)
Creatinine, Ser: 3.97 mg/dL — ABNORMAL HIGH (ref 0.44–1.00)
GFR, Estimated: 12 mL/min — ABNORMAL LOW (ref >60)
Glucose, Bld: 134 mg/dL — ABNORMAL HIGH (ref 70–99)
Potassium: 3.6 mmol/L (ref 3.5–5.1)
Sodium: 137 mmol/L (ref 135–145)

## 2019-10-31 LAB — CULTURE, BLOOD (ROUTINE X 2)
Culture: NO GROWTH
Special Requests: ADEQUATE

## 2019-10-31 MED ORDER — MELATONIN 5 MG PO TABS
2.5000 mg | ORAL_TABLET | Freq: Every day | ORAL | Status: DC
  Administered 2019-10-31 – 2020-01-28 (×85): 2.5 mg via ORAL
  Filled 2019-10-31 (×57): qty 1
  Filled 2019-10-31: qty 0.5
  Filled 2019-10-31 (×25): qty 1
  Filled 2019-10-31: qty 0.5
  Filled 2019-10-31 (×5): qty 1

## 2019-10-31 MED ORDER — MELATONIN 3 MG PO TABS
3.0000 mg | ORAL_TABLET | Freq: Every day | ORAL | Status: DC
  Filled 2019-10-31: qty 1

## 2019-10-31 MED ORDER — SODIUM CHLORIDE 0.9 % IV SOLN
2.0000 g | Freq: Every day | INTRAVENOUS | Status: DC
  Administered 2019-11-01 – 2019-11-09 (×8): 2 g via INTRAVENOUS
  Filled 2019-10-31: qty 20
  Filled 2019-10-31: qty 2
  Filled 2019-10-31 (×3): qty 20
  Filled 2019-10-31: qty 2
  Filled 2019-10-31 (×3): qty 20
  Filled 2019-10-31: qty 2
  Filled 2019-10-31: qty 20

## 2019-10-31 MED ORDER — OXYCODONE-ACETAMINOPHEN 5-325 MG PO TABS
1.0000 | ORAL_TABLET | ORAL | Status: DC | PRN
  Administered 2019-11-01 – 2019-12-11 (×14): 1 via ORAL
  Filled 2019-10-31 (×20): qty 1

## 2019-10-31 MED ORDER — LOPERAMIDE HCL 2 MG PO CAPS
4.0000 mg | ORAL_CAPSULE | Freq: Once | ORAL | Status: AC
  Administered 2019-10-31: 4 mg via ORAL
  Filled 2019-10-31: qty 2

## 2019-10-31 MED ORDER — HALOPERIDOL 2 MG PO TABS
2.0000 mg | ORAL_TABLET | Freq: Once | ORAL | Status: AC
  Administered 2019-10-31: 2 mg via ORAL
  Filled 2019-10-31: qty 1

## 2019-10-31 MED ORDER — CALCIUM CARBONATE ANTACID 500 MG PO CHEW
1.0000 | CHEWABLE_TABLET | Freq: Three times a day (TID) | ORAL | Status: DC | PRN
  Administered 2019-10-31 – 2020-01-27 (×3): 200 mg via ORAL
  Filled 2019-10-31 (×3): qty 1

## 2019-10-31 NOTE — Progress Notes (Signed)
Katelyn Darby, MD [address]  [place]  [place], [place] [address]  Main: [phone]  Fax: [phone] Pager: [phone]   Subjective: Denies abdominal pain, nausea or vomiting, reports eating well, having bowel movements   Objective: Vital signs in last 24 hours: Vitals:   10/30/19 2002 10/31/19 0254 10/31/19 0808 10/31/19 1129  BP: 103/86 99/74 97/80  105/78  Pulse: (!) 43 89 84 85  Resp: 18 20 18 19   Temp: (!) 97.3 F (36.3 C) 97.6 F (36.4 C) 97.6 F (36.4 C) (!) 97.4 F (36.3 C)  TempSrc: Oral Oral Oral Oral  SpO2: 98% 99% 97% 99%  Weight:  99 kg    Height:       Weight change: -3.425 kg  Intake/Output Summary (Last 24 hours) at 10/31/2019 1522 Last data filed at 10/31/2019 1432 Gross per 24 hour  Intake 1045.42 ml  Output 1100 ml  Net -54.58 ml     Exam: Heart:: Regular rate and rhythm, S1S2 present or without murmur or extra heart sounds Lungs: normal and clear to auscultation Abdomen: soft, nontender, normal bowel sounds   Lab Results: CBC Latest Ref Rng & Units 10/29/2019 10/28/2019 10/27/2019  WBC 4.0 - 10.5 K/uL 6.5 7.7 10.3  Hemoglobin 12.0 - 15.0 g/dL 9.8(L) 10.0(L) 10.3(L)  Hematocrit 36 - 46 % 29.3(L) 30.4(L) 31.4(L)  Platelets 150 - 400 K/uL 229 222 245   CMP Latest Ref Rng & Units 10/31/2019 10/30/2019 10/29/2019  Glucose 70 - 99 mg/dL 134(H) 194(H) 127(H)  BUN 8 - 23 mg/dL 77(H) 79(H) 73(H)  Creatinine 0.44 - 1.00 mg/dL 3.97(H) 4.16(H) 4.13(H)  Sodium 135 - 145 mmol/L 137 136 137  Potassium 3.5 - 5.1 mmol/L 3.6 4.0 4.1  Chloride 98 - 111 mmol/L 100 99 102  CO2 22 - 32 mmol/L 27 24 24   Calcium 8.9 - 10.3 mg/dL 9.3 9.2 8.8(L)  Total Protein 6.5 - 8.1 g/dL - - -  Total Bilirubin 0.3 - 1.2 mg/dL - - -  Alkaline Phos 38 - 126 U/L - - -  AST 15 - 41 U/L - - -  ALT 0 - 44 U/L - - -    Micro Results: Recent Results (from the past 240 hour(s))  MRSA PCR Screening     Status: None   Collection Time: 10/25/19  2:50 PM    Specimen: Nasopharyngeal  Result Value Ref Range Status   MRSA by PCR NEGATIVE NEGATIVE Final    Comment:        The GeneXpert MRSA Assay (FDA approved for NASAL specimens only), is one component of a comprehensive MRSA colonization surveillance program. It is not intended to diagnose MRSA infection nor to guide or monitor treatment for MRSA infections. Performed at [facility], [address] [place], [place] [address]   CULTURE, BLOOD (ROUTINE X 2) w Reflex to ID Panel     Status: None   Collection Time: 10/25/19  4:29 PM   Specimen: BLOOD  Result Value Ref Range Status   Specimen Description BLOOD CENTRAL LINE  Final   Special Requests   Final    BOTTLES DRAWN AEROBIC AND ANAEROBIC Blood Culture adequate volume   Culture   Final    NO GROWTH 5 DAYS Performed at [facility], [address] [place], [place] [address]    Report Status 10/30/2019 FINAL  Final  CULTURE, BLOOD (ROUTINE X 2) w Reflex to ID Panel     Status: None   Collection Time:  10/26/19  6:45 AM   Specimen: BLOOD  Result Value Ref Range Status   Specimen Description BLOOD BLOOD LEFT HAND  Final   Special Requests   Final    BOTTLES DRAWN AEROBIC AND ANAEROBIC Blood Culture adequate volume   Culture   Final    NO GROWTH 5 DAYS Performed at [facility], [address] [place], [place] [address]    Report Status 10/31/2019 FINAL  Final  Body fluid culture     Status: None   Collection Time: 10/26/19 11:30 AM   Specimen: Fluid  Result Value Ref Range Status   Specimen Description   Final    FLUID Performed at [facility], [address], [place], [place] [address]    Special Requests   Final    PERITONEAL Performed at [facility], [address] [place], [place] [address]    Gram Stain   Final    ABUNDANT WBC PRESENT, PREDOMINANTLY PMN NO ORGANISMS SEEN    Culture   Final    NO GROWTH Performed at [facility], [place] [address], [place], [place] [address]    Report Status 10/30/2019 FINAL  Final  Body fluid culture     Status: None (Preliminary result)   Collection Time: 10/29/19  1:39 PM   Specimen: PATH Cytology Peritoneal fluid  Result Value Ref Range Status   Specimen Description   Final    PERITONEAL Performed at [facility], [address], [place], [place] [address]    Special Requests   Final    NONE Performed at [facility], [address] [place], [place] [address]    Gram Stain   Final    MODERATE WBC PRESENT, PREDOMINANTLY PMN NO ORGANISMS SEEN    Culture   Final    NO GROWTH 2 DAYS Performed at [facility], [place] [address] [place], [place] [address]    Report Status PENDING  Incomplete   Studies/Results: US Paracentesis  Result Date: 10/29/2019 INDICATION: Recurrent ascites, SBP, cirrhosis and end-stage renal disease. EXAM: ULTRASOUND GUIDED PARACENTESIS MEDICATIONS: None. COMPLICATIONS: None immediate. PROCEDURE: Informed written consent was obtained from the patient after a discussion of the risks, benefits and alternatives to treatment. A timeout was performed prior to the initiation of the procedure. Initial ultrasound scanning was performed to localize ascites. The ri left ght lower abdomen was prepped and draped in the usual sterile fashion. 1% lidocaine was used for local anesthesia. Following this, a 6 Fr Safe-T-Centesis catheter was introduced. An ultrasound image was saved for documentation purposes. The paracentesis was performed. The catheter was removed and a dressing was applied. The patient tolerated the procedure well without immediate post procedural complication. FINDINGS: A total of approximately 3.6 L of amber fluid was removed. IMPRESSION: Successful ultrasound-guided paracentesis yielding 3.6 liters of peritoneal fluid. Electronically Signed   By: Aletta Edouard M.D.   On: 10/29/2019 15:58   Medications:  I have reviewed the patient's current  medications. Prior to Admission:  Medications Prior to Admission  Medication Sig Dispense Refill Last Dose  . albuterol (VENTOLIN HFA) 108 (90 Base) MCG/ACT inhaler Inhale 2 puffs into the lungs every 6 (six) hours as needed for wheezing or shortness of breath.   unknown at unknown  . allopurinol (ZYLOPRIM) 100 MG tablet Take 1 tablet (100 mg total) by mouth daily. 30 tablet 0   . ascorbic acid (VITAMIN C) 500 MG tablet Take 500 mg by mouth daily.  unknown at unknown  . aspirin 81 MG chewable tablet Chew 1 tablet (81 mg total) by mouth daily. 30 tablet 0 unknown at unknown  . atorvastatin (LIPITOR) 20 MG tablet Take 1 tablet (20 mg total) by mouth daily at 6 PM. 30 tablet 2   . benztropine (COGENTIN) 0.5 MG tablet Take 0.5 tablets (0.25 mg total) by mouth 2 (two) times daily. 30 tablet 0   . bumetanide (BUMEX) 1 MG tablet Take 1 tablet (1 mg total) by mouth 2 (two) times daily. May take additional dose for worsening leg swellings/shortness of breath 60 tablet 2   . carvedilol (COREG) 6.25 MG tablet Hold this medication until you see your cardiologist 30 tablet 2 unknown at unknown  . docusate calcium (SURFAK) 240 MG capsule Take 240 mg by mouth daily as needed for moderate constipation.  (Patient not taking: Reported on 07/27/2019)     . ferrous sulfate 325 (65 FE) MG tablet Take 1 tablet (325 mg total) by mouth daily with breakfast. 30 tablet 0   . FLUoxetine (PROZAC) 10 MG capsule Take 3 capsules (30 mg total) by mouth daily. 90 capsule 0   . gabapentin (NEURONTIN) 300 MG capsule Take 1 capsule (300 mg total) by mouth 3 (three) times daily. 90 capsule 0   . Ipratropium-Albuterol (COMBIVENT) 20-100 MCG/ACT AERS respimat Inhale 1 puff into the lungs every 6 (six) hours. 4 g 0 unknown at unknown  . loratadine (CLARITIN) 10 MG tablet Take 10 mg by mouth daily as needed for allergies.    unknown at unknown  . midodrine (PROAMATINE) 2.5 MG tablet Take 1 tablet (2.5 mg total) by mouth 3 (three) times  daily with meals as needed (for MAP <65 and/or SBP <100). 30 tablet 0 unknown at unknown  . nitroGLYCERIN (NITROSTAT) 0.4 MG SL tablet Place 1 tablet (0.4 mg total) under the tongue every 5 (five) minutes as needed for chest pain. 30 tablet 1 prn at prn  . pantoprazole (PROTONIX) 40 MG tablet Take 1 tablet (40 mg total) by mouth daily. 30 tablet 0 unknown at unknown  . rivaroxaban (XARELTO) 20 MG TABS tablet Take 1 tablet (20 mg total) by mouth daily with supper. For the blood clot in your heart. 30 tablet 2   . sacubitril-valsartan (ENTRESTO) 49-51 MG Take 1 tablet by mouth 2 (two) times daily. 60 tablet 0 unknown at unknown  . spironolactone (ALDACTONE) 25 MG tablet Take 1 tablet (25 mg total) by mouth daily. 30 tablet 1   . spironolactone (ALDACTONE) 25 MG tablet Take 1 tablet (25 mg total) by mouth daily. Hold this medication until you see your cardiologist 30 tablet 1 unknown at unknown  . thiothixene (NAVANE) 1 MG capsule Take 1 capsule (1 mg total) by mouth 2 (two) times daily. 60 capsule 0    Scheduled: . Chlorhexidine Gluconate Cloth  6 each Topical Daily  . furosemide  60 mg Intravenous BID  . mouth rinse  15 mL Mouth Rinse BID  . pantoprazole (PROTONIX) IV  40 mg Intravenous BID  . polyethylene glycol  17 g Oral Daily  . sodium chloride flush  3 mL Intravenous Q12H   Continuous: . sodium chloride 250 mL (10/31/19 0950)  . [START ON 11/01/2019] cefTRIAXone (ROCEPHIN)  IV     NFA:OZHYQM chloride, acetaminophen, albuterol, alum & mag hydroxide-simeth **AND** lidocaine, bisacodyl, Glycerin (Adult), HYDROmorphone (DILAUDID) injection, midodrine, ondansetron (ZOFRAN) IV, ondansetron (ZOFRAN) IV, sodium chloride flush Anti-infectives (From admission, onward)   Start  Dose/Rate Route Frequency Ordered Stop   11/01/19 1000  cefTRIAXone (ROCEPHIN) 2 g in sodium chloride 0.9 % 100 mL IVPB        2 g 200 mL/hr over 30 Minutes Intravenous Daily 10/31/19 1521     10/27/19 1000  cefTRIAXone  (ROCEPHIN) 2 g in sodium chloride 0.9 % 100 mL IVPB  Status:  Discontinued        2 g 200 mL/hr over 30 Minutes Intravenous Daily 10/26/19 1559 10/31/19 1521   10/26/19 2000  cefTRIAXone (ROCEPHIN) 1 g in sodium chloride 0.9 % 100 mL IVPB        1 g 200 mL/hr over 30 Minutes Intravenous  Once 10/26/19 1559 10/26/19 2024   10/26/19 1600  cefTRIAXone (ROCEPHIN) 2 g in sodium chloride 0.9 % 100 mL IVPB  Status:  Discontinued        2 g 200 mL/hr over 30 Minutes Intravenous Every 24 hours 10/26/19 1351 10/26/19 1600   10/26/19 1000  metroNIDAZOLE (FLAGYL) IVPB 500 mg  Status:  Discontinued        500 mg 100 mL/hr over 60 Minutes Intravenous Every 8 hours 10/26/19 0909 10/29/19 1138   10/25/19 1445  azithromycin (ZITHROMAX) 500 mg in sodium chloride 0.9 % 250 mL IVPB  Status:  Discontinued        500 mg 250 mL/hr over 60 Minutes Intravenous Every 24 hours 10/25/19 1353 10/29/19 1138   10/25/19 1230  azithromycin (ZITHROMAX) tablet 500 mg  Status:  Discontinued        500 mg Oral Daily 10/25/19 1133 10/25/19 1134   10/25/19 1030  cefTRIAXone (ROCEPHIN) 1 g in sodium chloride 0.9 % 100 mL IVPB  Status:  Discontinued        1 g 200 mL/hr over 30 Minutes Intravenous Every 12 hours 10/25/19 0931 10/26/19 1351     Scheduled Meds: . Chlorhexidine Gluconate Cloth  6 each Topical Daily  . furosemide  60 mg Intravenous BID  . mouth rinse  15 mL Mouth Rinse BID  . pantoprazole (PROTONIX) IV  40 mg Intravenous BID  . polyethylene glycol  17 g Oral Daily  . sodium chloride flush  3 mL Intravenous Q12H   Continuous Infusions: . sodium chloride 250 mL (10/31/19 0950)  . [START ON 11/01/2019] cefTRIAXone (ROCEPHIN)  IV     PRN Meds:.sodium chloride, acetaminophen, albuterol, alum & mag hydroxide-simeth **AND** lidocaine, bisacodyl, Glycerin (Adult), HYDROmorphone (DILAUDID) injection, midodrine, ondansetron (ZOFRAN) IV, ondansetron (ZOFRAN) IV, sodium chloride flush   Assessment: Principal  Problem:   Dementia ([place]) Active Problems:   AKI (acute kidney injury) ([place])   Crack cocaine use   Acute respiratory failure with hypoxia (HCC)   LV (left ventricular) mural thrombus   Acute CHF (congestive heart failure) (HCC)   Cocaine abuse ([place])   Peripheral edema   Cirrhosis of liver (HCC)   Spontaneous bacterial peritonitis ([place])   Plan: Cardiac cirrhosis with ascites presenting with spontaneous bacterial peritonitis Cultures have been negative so far Continue with ceftriaxone daily Recommend repeat fluid analysis for cell count and differential on day 5-7 of antibiotic to assess response to antibiotics    LOS: 12 days   Katelyn Lane 10/31/2019, 3:22 PM

## 2019-10-31 NOTE — Progress Notes (Signed)
Patient assisted to [facility]. Loose stools mixed in urine. Patient intermittently agitated and combative this afternoon. PRN Haldol given. NO reduction in agitation noted at this time. Will continue to reassess and inform night shift pf patient's behavior. Overall her behavior is at a better/calmer baseline than previous admissions when I have cared for her.

## 2019-10-31 NOTE — Progress Notes (Signed)
[facility] Encounter Note  Patient: Katelyn Lane / Admit Date: 10/19/2019 / Date of Encounter: 10/31/2019, 7:21 AM   Subjective: Patient overall has had no evidence of significant complaints today.  The patient still has trace to 1+ edema and some basilar crackles but no complaints of other issues.  The patient's blood pressure has been low without ability to add any medication management for congestive heart failure and requires occasional midodrine as well.  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  There has been no other significant concerns this a.m. and through the night  Review of Systems: Positive for: Weakness Negative for: Vision change, hearing change, syncope, dizziness, nausea, vomiting,diarrhea, bloody stool, stomach pain, cough, congestion, diaphoresis, urinary frequency, urinary pain,skin lesions, skin rashes Others previously listed  Objective: Telemetry: Normal sinus rhythm Physical Exam: Blood pressure 99/74, pulse 89, temperature 97.6 F (36.4 C), temperature source Oral, resp. rate 20, height 5\' 6"  (1.676 m), weight 99 kg, SpO2 99 %. Body mass index is 35.22 kg/m. General: Well developed, well nourished, in no acute distress. Head: Normocephalic, atraumatic, sclera non-icteric, no xanthomas, nares are without discharge. Neck: No apparent masses Lungs: Normal respirations with no wheezes, few rhonchi, no rales , basilar crackles   Heart: Regular rate and rhythm, normal S1 S2, no murmur, no rub, no gallop, PMI is normal size and placement, carotid upstroke normal without bruit, jugular venous pressure normal Abdomen: Soft, non-tender, non-distended with normoactive bowel sounds. No hepatosplenomegaly. Abdominal aorta is normal size without bruit Extremities: 1+ edema, no clubbing, no cyanosis, no ulcers,  Peripheral: 2+ radial, 2+ femoral, 2+ dorsal  pedal pulses Neuro: Alert and oriented. Moves all extremities spontaneously. Psych:  Responds to questions appropriately with a normal affect.   Intake/Output Summary (Last 24 hours) at 10/31/2019 0721 Last data filed at 10/30/2019 2137 Gross per 24 hour  Intake 1308.42 ml  Output 2350 ml  Net -1041.58 ml    Inpatient Medications:  . Chlorhexidine Gluconate Cloth  6 each Topical Daily  . furosemide  60 mg Intravenous BID  . mouth rinse  15 mL Mouth Rinse BID  . pantoprazole (PROTONIX) IV  40 mg Intravenous BID  . polyethylene glycol  17 g Oral Daily  . sodium chloride flush  3 mL Intravenous Q12H   Infusions:  . sodium chloride Stopped (10/26/19 1410)  . cefTRIAXone (ROCEPHIN)  IV Stopped (10/30/19 1104)    Labs: Recent Labs    10/30/19 0550 10/31/19 0446  NA 136 137  K 4.0 3.6  CL 99 100  CO2 24 27  GLUCOSE 194* 134*  BUN 79* 77*  CREATININE 4.16* 3.97*  CALCIUM 9.2 9.3   No results for input(s): AST, ALT, ALKPHOS, BILITOT, PROT, ALBUMIN in the last 72 hours. Recent Labs    10/29/19 0413  WBC 6.5  HGB 9.8*  HCT 29.3*  MCV 71.3*  PLT 229   No results for input(s): CKTOTAL, CKMB, TROPONINI in the last 72 hours. Invalid input(s): POCBNP No results for input(s): HGBA1C in the last 72 hours.   Weights: Filed Weights   10/29/19 0350 10/30/19 0400 10/31/19 0254  Weight: 109.3 kg 102.4 kg 99 kg     Radiology/Studies:  CT ABDOMEN PELVIS WO CONTRAST  Result Date: 10/27/2019 CLINICAL DATA:  64 year old female with abdominal pain. Concern for peritonitis. EXAM: CT ABDOMEN AND PELVIS WITHOUT CONTRAST TECHNIQUE: Multidetector CT imaging of the abdomen and pelvis was performed following  the standard protocol without IV contrast. COMPARISON:  CT abdomen pelvis dated 08/15/2019. FINDINGS: Evaluation of this exam is limited in the absence of intravenous contrast. Lower chest: Partially visualized small bilateral pleural effusions. There is partial consolidative changes  of the right lower lobe and majority of the visualized left lower lobe which may represent atelectasis or pneumonia. Clinical correlation is recommended. There is moderate cardiomegaly. Coronary vascular calcifications noted. There is hypoattenuation of the cardiac blood pool suggestive of anemia. Clinical correlation is recommended. Moderate ascites, significantly increased since the prior CT. Hepatobiliary: Slight irregularity of the liver contour may represent early changes of cirrhosis. Clinical correlation is recommended. No intrahepatic biliary ductal dilatation. The gallbladder is predominantly contracted. Pancreas: The pancreas is grossly unremarkable. Spleen: Normal in size without focal abnormality. Adrenals/Urinary Tract: The adrenal glands are unremarkable. There is no hydronephrosis or nephrolithiasis on either side. The urinary bladder is decompressed around a Foley catheter. Stomach/Bowel: There is air within the wall of the gastric fundus consistent with pneumatosis and most concerning for bowel ischemia. Two small pockets of air in the periportal region and along the falciform ligament (35/2) most concerning for extraluminal air, possibly intravascular. Correlation with clinical exam and lactic acid levels recommended. Mildly dilated small bowel loops measure up to 4 cm in caliber. The distal small bowel are collapsed. A transition may be present in the midline anterior abdomen (63/2. Evaluation of the bowel is very limited due to anasarca. There is sigmoid diverticulosis without active inflammatory changes. The appendix is not identified. Vascular/Lymphatic: Mild aortoiliac atherosclerotic disease. The IVC is unremarkable. Right femoral central venous line with tip in the distal IVC. Reproductive: Hysterectomy. Other: Severe diffuse subcutaneous edema and anasarca. Musculoskeletal: Degenerative changes of the spine. No acute osseous pathology. IMPRESSION: 1. Findings most concerning for gastric  pneumatosis with small pockets of air likely in the portal vein suggestive of bowel ischemia. Correlation with clinical exam and lactic acid levels recommended. 2. Findings of bowel obstruction with transition in the midline abdomen. 3. Sigmoid diverticulosis. 4. Moderate ascites, significantly increased since the prior CT. 5. Severe diffuse subcutaneous edema and anasarca. 6. Partially visualized small bilateral pleural effusions with partial consolidative changes of the visualized lower lobes which may represent atelectasis or pneumonia. Clinical correlation is recommended. 7. Aortic Atherosclerosis (ICD10-I70.0). These results were called by telephone at the time of interpretation on 10/27/2019 at 12:52 am to Nurse Dossie Der, who verbally acknowledged these results. Electronically Signed   By: Anner Crete M.D.   On: 10/27/2019 01:05   DG Chest 2 View  Result Date: 10/19/2019 CLINICAL DATA:  Shortness of breath. EXAM: CHEST - 2 VIEW COMPARISON:  08/27/2019 and chest CT 08/15/2019 FINDINGS: Stable cardiomegaly. No overt pulmonary edema. Difficult to exclude densities at the left lung base and small left pleural effusion. Negative for a pneumothorax. IMPRESSION: Stable cardiomegaly with probable left basilar chest densities. Difficult to exclude a small left pleural effusion. Minimal change from the exam on 08/27/2019. Electronically Signed   By: Markus Daft M.D.   On: 10/19/2019 09:42   DG Abd 1 View  Result Date: 10/26/2019 CLINICAL DATA:  Abdominal pain and recent paracentesis EXAM: ABDOMEN - 1 VIEW COMPARISON:  10/24/2019 FINDINGS: Scattered large and small bowel gas is noted although predominately centrally which suggests some persistent ascites despite recent paracentesis. Dialysis catheter is noted in the right femoral vein extending into the IVC. This is new from the prior exam. No bony abnormality is seen. No obstructive changes are noted. IMPRESSION:  Changes suggestive of mild residual  ascites. No other focal acute abnormality is noted. Electronically Signed   By: Inez Catalina M.D.   On: 10/26/2019 18:59   US Abdomen Complete  Result Date: 10/24/2019 CLINICAL DATA:  Abdominal pain. Mildly elevated bilirubin and lipase 4 months. EXAM: ABDOMEN ULTRASOUND COMPLETE COMPARISON:  None. FINDINGS: Gallbladder: The gallbladder wall is thickened measuring up to 5.5 mm. No stones, sludge, or Murphy's sign identified. Common bile duct: Diameter: 2.9 mm Liver: Diffuse coarsened echotexture with a nodular contour. No focal mass. Portal vein is patent on color Doppler imaging with normal direction of blood flow towards the liver. IVC: No abnormality visualized. Pancreas: Limited visualization due to bowel gas shadowing. Limited views of the pancreas are normal. Spleen: Size and appearance within normal limits. Right Kidney: Length: 9.7 cm. Contains 2 probable small cysts measuring 1.7 and 1.4 cm. Left Kidney: Length: 10.3 cm. Echogenicity within normal limits. No mass or hydronephrosis visualized. Abdominal aorta: 3.1 cm proximally Other findings: Ascites is seen in all 4 quadrants. Evaluation is limited due to labored breathing. IMPRESSION: 1. The gallbladder wall is thickened which is nonspecific given apparent signs of cirrhosis and obvious ascites. The gallbladder is otherwise unremarkable. If there is concern for acute cholecystitis, recommend HIDA scan. 2. 4 quadrant ascites, moderate. 3. Coarsened echotexture to the liver with a nodular contour suggesting cirrhosis. 4. Mild prominence of the proximal abdominal aorta measuring 3.1 cm. 5. Small cyst in the right kidney. Electronically Signed   By: Dorise Bullion III M.D   On: 10/24/2019 10:28   US RENAL  Result Date: 10/20/2019 CLINICAL DATA:  64 year old female with acute renal failure. EXAM: RENAL / URINARY TRACT ULTRASOUND COMPLETE COMPARISON:  CT abdomen pelvis dated 08/15/2019. FINDINGS: Right Kidney: Renal measurements: 10.9 x 4.6 x 4.9 cm  = volume: 127 mL. There is mild diffuse increased echogenicity. No hydronephrosis or shadowing stone. There is a 1.7 x 1.1 x 1.1 cm lateral interpolar cyst. Left Kidney: Renal measurements: 10.1 x 4.8 x 5.3 cm = volume: 132 mL. Mild increased echogenicity. No hydronephrosis or shadowing stone. Bladder: The bladder is partially visualized. Other: There is a small ascites and anasarca. IMPRESSION: 1. Increased renal parenchymal echogenicity in keeping with chronic kidney disease. No hydronephrosis or shadowing stone. 2. Small ascites. Electronically Signed   By: Anner Crete M.D.   On: 10/20/2019 17:59   PERIPHERAL VASCULAR CATHETERIZATION  Result Date: 10/29/2019 See op note  US Paracentesis  Result Date: 10/29/2019 INDICATION: Recurrent ascites, SBP, cirrhosis and end-stage renal disease. EXAM: ULTRASOUND GUIDED PARACENTESIS MEDICATIONS: None. COMPLICATIONS: None immediate. PROCEDURE: Informed written consent was obtained from the patient after a discussion of the risks, benefits and alternatives to treatment. A timeout was performed prior to the initiation of the procedure. Initial ultrasound scanning was performed to localize ascites. The ri left ght lower abdomen was prepped and draped in the usual sterile fashion. 1% lidocaine was used for local anesthesia. Following this, a 6 Fr Safe-T-Centesis catheter was introduced. An ultrasound image was saved for documentation purposes. The paracentesis was performed. The catheter was removed and a dressing was applied. The patient tolerated the procedure well without immediate post procedural complication. FINDINGS: A total of approximately 3.6 L of amber fluid was removed. IMPRESSION: Successful ultrasound-guided paracentesis yielding 3.6 liters of peritoneal fluid. Electronically Signed   By: Aletta Edouard M.D.   On: 10/29/2019 15:58   US Paracentesis  Result Date: 10/26/2019 INDICATION: Ascites, distension EXAM: ULTRASOUND GUIDED DIAGNOSTIC AND  THERAPEUTIC PARACENTESIS MEDICATIONS: 1% LIDOCAINE LOCAL COMPLICATIONS: None immediate. PROCEDURE: Informed written consent was obtained from the patient's sister by phone and verified by ultrasound staff after a discussion of the risks, benefits and alternatives to treatment. A timeout was performed prior to the initiation of the procedure. Initial ultrasound scanning demonstrates a large amount of ascites within the left lower abdominal quadrant. The left lower abdomen was prepped and draped in the usual sterile fashion. 1% lidocaine was used for local anesthesia. Following this, a 6 Fr Safe-T-Centesis catheter was introduced. An ultrasound image was saved for documentation purposes. The paracentesis was performed. The catheter was removed and a dressing was applied. The patient tolerated the procedure well without immediate post procedural complication. FINDINGS: A total of approximately 6.4 L of yellow peritoneal fluid was removed. Samples were sent to the laboratory as requested by the clinical team. IMPRESSION: Successful ultrasound-guided paracentesis yielding 6.4 liters of peritoneal fluid. Electronically Signed   By: Jerilynn Mages.  Shick M.D.   On: 10/26/2019 12:36   DG Chest Port 1 View  Result Date: 10/24/2019 CLINICAL DATA:  64 year old female admitted with heart failure. EXAM: PORTABLE CHEST 1 VIEW COMPARISON:  10/19/2019 FINDINGS: Marked cardiomegaly, unchanged. Cephalization of the pulmonary vasculature. Streaky bibasilar pulmonary opacities. Blunting of left costophrenic angle. No pneumothorax. No acute osseous abnormality. IMPRESSION: Mild pulmonary edema and probable trace left pleural effusion. Stable cardiomegaly. Electronically Signed   By: Ruthann Cancer MD   On: 10/24/2019 12:00   DG ABD ACUTE 2+V W 1V CHEST  Result Date: 10/25/2019 CLINICAL DATA:  Shortness of breath EXAM: DG ABDOMEN ACUTE W/ 1V CHEST COMPARISON:  02/21/2019 FINDINGS: Shallow lung inflation with mild cardiomegaly and left  basilar consolidation. Bowel gas pattern is nonobstructive. No free intraperitoneal air. IMPRESSION: Shallow lung inflation with left basilar consolidation, which may indicate atelectasis or pneumonia. Electronically Signed   By: Ulyses Jarred M.D.   On: 10/25/2019 00:21     Assessment and Recommendation  64 y.o. 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  No additional medication management due to significant hypotension and other side effects of medication and unfortunately cannot press further 3.  Continue social work evaluation for possible placement due to recurrent episodes of hospital admissions with unfortunate social situation  Signed, Serafina Royals M.D. FACC

## 2019-10-31 NOTE — Progress Notes (Signed)
Patient calm and cooperative, awaiting lunch.  Reports being "fine" and smiled brightly.  Psychiatrically cleared--no threats to self or others.  Waylan Boga, PMHNP

## 2019-10-31 NOTE — Progress Notes (Signed)
Katelyn Lane  MRN: [id number]  DOB/AGE: 1955/02/15 64 y.o.  Primary Care Physician:Lane, Katelyn Jarvis, MD  Admit date: 10/19/2019  Chief Complaint:  Chief Complaint  Patient presents with  . Leg Swelling    S-Pt presented on  10/19/2019 with  Chief Complaint  Patient presents with  . Leg Swelling  . Patient offers no specific complaints.  Medications . Chlorhexidine Gluconate Cloth  6 each Topical Daily  . furosemide  60 mg Intravenous BID  . mouth rinse  15 mL Mouth Rinse BID  . melatonin  2.5 mg Oral QHS  . pantoprazole (PROTONIX) IV  40 mg Intravenous BID  . sodium chloride flush  3 mL Intravenous Q12H         PJA:SNKNL from the symptoms mentioned above,there are no other symptoms referable to all systems reviewed.  Physical Exam: Vital signs in last 24 hours: Temp:  [97.3 F (36.3 C)-97.6 F (36.4 C)] 97.4 F (36.3 C) (10/23 1129) Pulse Rate:  [43-89] 77 (10/23 1632) Resp:  [18-20] 19 (10/23 1632) BP: (97-106)/(73-86) 106/73 (10/23 1632) SpO2:  [96 %-99 %] 96 % (10/23 1632) Weight:  [99 kg] 99 kg (10/23 0254) Weight change: -3.425 kg Last BM Date: 10/29/19  Intake/Output from previous day: 10/22 0701 - 10/23 0700 In: 1308.4 [P.O.:840; I.V.:3; IV Piggyback:465.4] Out: 2350 [Urine:2350] No intake/output data recorded.   Physical Exam: General- pt is awake,alert, oriented to time place and person  Resp- No acute REsp distress, CTA B/L NO Rhonchi  CVS- S1S2 regular in rate and rhythm  GIT- BS+, soft, Non tender , Non distended  EXT- NO LE Edema, NO Cyanosis  Access-  tunneled cath   Lab Results:  CBC  Recent Labs    10/29/19 0413  WBC 6.5  HGB 9.8*  HCT 29.3*  PLT 229    BMET  Recent Labs    10/30/19 0550 10/31/19 0446  NA 136 137  K 4.0 3.6  CL 99 100  CO2 24 27  GLUCOSE 194* 134*  BUN 79* 77*  CREATININE 4.16* 3.97*  CALCIUM 9.2 9.3      Most recent Creatinine trend  Lab Results  Component Value Date    CREATININE 3.97 (H) 10/31/2019   CREATININE 4.16 (H) 10/30/2019   CREATININE 4.13 (H) 10/29/2019    Creat trend 2021 1.8--4.0( Multiple admissions)  2020 1.7--2.3 ( Multiple admissions)  2014 1.54  MICRO   Recent Results (from the past 240 hour(s))  MRSA PCR Screening     Status: None   Collection Time: 10/25/19  2:50 PM   Specimen: Nasopharyngeal  Result Value Ref Range Status   MRSA by PCR NEGATIVE NEGATIVE Final    Comment:        The GeneXpert MRSA Assay (FDA approved for NASAL specimens only), is one component of a comprehensive MRSA colonization surveillance program. It is not intended to diagnose MRSA infection nor to guide or monitor treatment for MRSA infections. Performed at [facility], [address] [place],  [address]   CULTURE, BLOOD (ROUTINE X 2) w Reflex to ID Panel     Status: None   Collection Time: 10/25/19  4:29 PM   Specimen: BLOOD  Result Value Ref Range Status   Specimen Description BLOOD CENTRAL LINE  Final   Special Requests   Final    BOTTLES DRAWN AEROBIC AND ANAEROBIC Blood Culture adequate volume   Culture   Final    NO GROWTH 5 DAYS Performed at [facility], [address] [place], [place] [address]    Report Status 10/30/2019 FINAL  Final  CULTURE, BLOOD (ROUTINE X 2) w Reflex to ID Panel     Status: None   Collection Time: 10/26/19  6:45 AM   Specimen: BLOOD  Result Value Ref Range Status   Specimen Description BLOOD BLOOD LEFT HAND  Final   Special Requests   Final    BOTTLES DRAWN AEROBIC AND ANAEROBIC Blood Culture adequate volume   Culture   Final    NO GROWTH 5 DAYS Performed at [facility], [address], [place], [place] [address]    Report Status 10/31/2019 FINAL  Final  Body fluid culture     Status: None   Collection Time: 10/26/19 11:30 AM   Specimen: Fluid  Result Value Ref Range Status   Specimen Description   Final    FLUID Performed at [facility], [address], [place], [place] [address]    Special Requests   Final    PERITONEAL Performed at [facility], [address] [place], [place] [address]    Gram Stain   Final    ABUNDANT WBC PRESENT, PREDOMINANTLY PMN NO ORGANISMS SEEN    Culture   Final    NO GROWTH Performed at [facility], [place] [address], [place], [place] [address]    Report Status 10/30/2019 FINAL  Final  Body fluid culture     Status: None (Preliminary result)   Collection Time: 10/29/19  1:39 PM   Specimen: PATH Cytology Peritoneal fluid  Result Value Ref Range Status   Specimen Description   Final    PERITONEAL Performed at [facility], [address] [place], [place] [address]    Special Requests   Final    NONE Performed at [facility], [address] [place], [place] [address]    Gram Stain   Final    MODERATE WBC PRESENT, PREDOMINANTLY PMN NO ORGANISMS SEEN    Culture   Final    NO GROWTH 2 DAYS Performed at [facility], [place] [address], [place],  [address]    Report Status PENDING  Incomplete         Impression:   [name] is a 64 y.o. black female with congestive heart failure, hypertension, hyperlipidemia, gout, COPD, anemia, cocaine abuse who is admitted to [facility] on 10/19/2019 for Peripheral edema [R60.9] Acute CHF (congestive heart failure) . AKI (acute kidney injury) ( Congestive heart failure, unspecified HF chronicity, unspecified heart failure type .  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 CKD since 2014 CKD secondary to HTN Progression of CKD marked with multiple AKI   Patient did require dialysis during this admission Patient had permacath placed as well Patient current urine output is adequate Patient creatinine is trending down without dialysis We will continue to follow and decide about dialysis/permacath in the next few days.  2)hypotension Blood pressure is stable    3)Anemia  of chronic disease  CBC Latest Ref Rng & Units 10/29/2019 10/28/2019 10/27/2019  WBC 4.0 - 10.5 K/uL 6.5 7.7 10.3  Hemoglobin 12.0 - 15.0 g/dL 9.8(L) 10.0(L) 10.3(L)  Hematocrit 36 - 46 % 29.3(L) 30.4(L) 31.4(L)  Platelets 150 - 400 K/uL 229 222 245       HGb at goal (9--11)   4) Secondary hyperparathyroidism -CKD Mineral-Bone Disorder    Lab Results  Component Value Date  CALCIUM 9.3 10/31/2019   PHOS 4.2 10/25/2019    Secondary Hyperparathyroidism absent.  Phosphorus at goal.   5)Acute exacerbation of systolic and diastolic congestive heart failure.  Echocardiogram 06/22/19 EF of 25-30%.  -on 3 L nasal cannula, patient keeps it  as needed -Blood pressure readings stays at the low normal range -Continue Furosemide 60 mg iv BID Now better  6) Electrolytes   BMP Latest Ref Rng & Units 10/31/2019 10/30/2019 10/29/2019  Glucose 70 - 99 mg/dL 134(H) 194(H) 127(H)  BUN 8 - 23 mg/dL 77(H) 79(H) 73(H)  Creatinine 0.44 - 1.00 mg/dL 3.97(H) 4.16(H) 4.13(H)  Sodium 135 - 145 mmol/L 137 136 137  Potassium 3.5 - 5.1 mmol/L 3.6 4.0 4.1  Chloride 98 - 111 mmol/L 100 99 102  CO2 22 - 32 mmol/L 27 24 24   Calcium 8.9 - 10.3 mg/dL 9.3 9.2 8.8(L)     Sodium Normonatremic   Potassium Normokalemic    7)Acid base Co2 at goal  8) Ascites/ Bacterial Peritonitis Patient is on IV antibiotics,Rocephin and Flagyl GI  team managing    Plan:  No need for dialysis today  we will continue to follow closely for need for renal placement therapy      [place] Katelyn Lane 10/31/2019, 7:01 PM

## 2019-10-31 NOTE — Progress Notes (Addendum)
PROGRESS NOTE    Katelyn Lane  VFI:[id number] DOB: 01/05/1956 DOA: 10/19/2019 PCP: [name], MD  Chief complaint abdominal pain.  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 Found to have BNP greater than 4500, elevated troponin and AKI.  Multiple prior admissions with similar symptoms and complaints. Prolonged hospitalization in August 2021 when she was discharged to SNF. Not clear when she get out of SNF. Continues to complain that she is homeless but per social worker she do have a house. Not take her medication despite being provided with medical management.  After admission to the hospital, she was diagnosed with acute on chronic combined systolicanddiastoliccongestiveheartfailure with ejection fraction 25 to 30%. She was not responding to IV Lasix. She was seen by nephrology, temporary dialysis catheter was performed and she was dialyzed on 10/17 and 10/18. Patient also complaining of abdominal pain, she had a paracentesis, study showed possible spontaneous peritonitis, she was treated with Rocephin and Flagyl.  Patient had a worsening respiratory status on 10/17, was diagnosed with aspiration pneumonia. She was treated with Rocephin, Zithromax and Flagyl.  10/21.Repeat paracentesis performed, antibiotic changed to Rocephin only. Permacath placed.  10/22.  Repeated paracentesis showed white cell count of 3048, 69% neutrophils.  However, patient clinically improving.  Continue Rocephin.    Assessment & Plan:   Principal Problem:   Dementia ([place]) Active Problems:   AKI (acute kidney injury) ([place])   Crack cocaine use   Acute respiratory failure with hypoxia ([facility])    LV (left ventricular) mural thrombus   Acute CHF (congestive heart failure) ([facility])   Cocaine abuse ([place])   Peripheral edema   Cirrhosis of liver ([place])  #1.  Spontaneous bacterial peritonitis. Patient still have occasional abdominal pain, continue Rocephin at high dose.  2.  Possible GI bleed. Patient still on IV Protonix.  Check a CBC tomorrow.  3.  Acute on chronic combined systolic and diastolic congestive heart failure, acute hypoxemic respite failure. Condition has been improving.  4.  Nonsustained ventricular tachycardia secondary to severe LV dysfunction. No recurrence.  5.  Aspiration pneumonia. Still on Rocephin for peritonitis.  6.  Liver cirrhosis with ascites. Followed by GI.  7.  Acute renal failure on chronic kidney disease stage IIIb. Permacatheter was placed, pending decision about future dialysis.  8.  Anemia of chronic disease and iron deficient anemia. Recheck CBC tomorrow.  9.  Left ventricular thrombus and history of CVA. No CBC for the last 2 days, patient does not appear to have any bleeding.  Recheck a CBC tomorrow, if hemoglobin stable, consider restart anticoagulation.  1425. Patient became agitated. Will give 2mg  oral haldol. Reviewed EKG 10/11 (could not open 10/12 and 10/18 EKG listed), no significant QI interval prolongation.   DVT prophylaxis: SCDs Code Status: Full Family Communication: None Disposition Plan:  .   Status is: Inpatient  Remains inpatient appropriate because:Inpatient level of care appropriate due to severity of illness   Dispo: The patient is from: Home              Anticipated d/c is to: SNF              Anticipated d/c date is: > 3 days              Patient  currently is not medically stable to d/c.        I/O last 3 completed shifts: In: 1308.4 [P.O.:840; I.V.:3; IV Piggyback:465.4] Out: 3850 [Urine:3850] Total I/O In: 240 [P.O.:240] Out: -      Consultants:   GI, Nephrology  Procedures:  Permacath  Antimicrobials:  Rocephin.  Subjective: Patient doing well.  Intermittent abdominal pain.  No nausea vomiting.  She still has some loose stools today.  One dose Imodium was given. Denies any short of breath or cough today. No fever chills. No dysuria hematuria.   Objective: Vitals:   10/30/19 2002 10/31/19 0254 10/31/19 0808 10/31/19 1129  BP: 103/86 99/74 97/80  105/78  Pulse: (!) 43 89 84 85  Resp: 18 20 18 19   Temp: (!) 97.3 F (36.3 C) 97.6 F (36.4 C) 97.6 F (36.4 C) (!) 97.4 F (36.3 C)  TempSrc: Oral Oral Oral Oral  SpO2: 98% 99% 97% 99%  Weight:  99 kg    Height:        Intake/Output Summary (Last 24 hours) at 10/31/2019 1329 Last data filed at 10/31/2019 0950 Gross per 24 hour  Intake 1305.42 ml  Output 1350 ml  Net -44.58 ml   Filed Weights   10/29/19 0350 10/30/19 0400 10/31/19 0254  Weight: 109.3 kg 102.4 kg 99 kg    Examination:  General exam: Appears calm and comfortable  Respiratory system: Clear to auscultation. Respiratory effort normal. Cardiovascular system: S1 & S2 heard, RRR. No JVD, murmurs, rubs, gallops or clicks. No pedal edema. Gastrointestinal system: Abdomen is nondistended, soft and mildly tender. No organomegaly or masses felt. Normal bowel sounds heard. Central nervous system: Alert and oriented x2. No focal neurological deficits. Extremities: Symmetric  Skin: No rashes, lesions or ulcers Psychiatry:  Mood & affect appropriate.     Data Reviewed: I have personally reviewed following labs and imaging studies  CBC: Recent Labs  Lab 10/25/19 0421 10/26/19 0645 10/27/19 0230 10/28/19 0503 10/29/19 0413  WBC 10.5 10.7* 10.3 7.7 6.5  HGB 13.2 11.4* 10.3* 10.0* 9.8*  HCT 41.6 34.8* 31.4* 30.4* 29.3*  MCV 74.4* 72.7* 73.0* 71.0* 71.3*  PLT 302 260 245 222 790   Basic Metabolic Panel: Recent Labs  Lab 10/25/19 0018 10/25/19 0421 10/25/19 1119 10/26/19 0645 10/27/19 0230 10/28/19 0503 10/29/19 0413  10/30/19 0550 10/31/19 0446  NA 138   < >  --    < > 136 136 137 136 137  K 5.4*   < >  --    < > 4.5 4.0 4.1 4.0 3.6  CL 105   < >  --    < > 103 101 102 99 100  CO2 23   < >  --    < > 24 25 24 24 27   GLUCOSE 124*   < >  --    < > 111* 104* 127* 194* 134*  BUN 61*   < >  --    < > 66* 70* 73* 79* 77*  CREATININE 3.27*   < >  --    < > 3.67* 4.01* 4.13* 4.16* 3.97*  CALCIUM 9.5   < >  --    < > 9.0 9.0 8.8* 9.2 9.3  MG 3.0*  --   --   --   --   --   --   --   --   PHOS  --   --  4.2  --   --   --   --   --   --    < > =  values in this interval not displayed.   GFR: Estimated Creatinine Clearance: 17.2 mL/min (A) (by C-G formula based on SCr of 3.97 mg/dL (H)). Liver Function Tests: Recent Labs  Lab 10/25/19 0421 10/26/19 0645 10/27/19 0230  AST 17 16 14*  ALT 10 8 8   ALKPHOS 88 73 68  BILITOT 2.2* 2.2* 1.7*  PROT 6.1* 5.4* 5.0*  ALBUMIN 3.0* 2.6* 2.4*   No results for input(s): LIPASE, AMYLASE in the last 168 hours. No results for input(s): AMMONIA in the last 168 hours. Coagulation Profile: Recent Labs  Lab 10/27/19 0230  INR 2.0*   Cardiac Enzymes: No results for input(s): CKTOTAL, CKMB, CKMBINDEX, TROPONINI in the last 168 hours. BNP (last 3 results) No results for input(s): PROBNP in the last 8760 hours. HbA1C: No results for input(s): HGBA1C in the last 72 hours. CBG: No results for input(s): GLUCAP in the last 168 hours. Lipid Profile: No results for input(s): CHOL, HDL, LDLCALC, TRIG, CHOLHDL, LDLDIRECT in the last 72 hours. Thyroid Function Tests: No results for input(s): TSH, T4TOTAL, FREET4, T3FREE, THYROIDAB in the last 72 hours. Anemia Panel: No results for input(s): VITAMINB12, FOLATE, FERRITIN, TIBC, IRON, RETICCTPCT in the last 72 hours. Sepsis Labs: Recent Labs  Lab 10/25/19 0421 10/27/19 0320  PROCALCITON 14.74  --   LATICACIDVEN  --  1.0    Recent Results (from the past 240 hour(s))  MRSA PCR Screening     Status: None   Collection  Time: 10/25/19  2:50 PM   Specimen: Nasopharyngeal  Result Value Ref Range Status   MRSA by PCR NEGATIVE NEGATIVE Final    Comment:        The GeneXpert MRSA Assay (FDA approved for NASAL specimens only), is one component of a comprehensive MRSA colonization surveillance program. It is not intended to diagnose MRSA infection nor to guide or monitor treatment for MRSA infections. Performed at [facility], [address] [place], [place] [address]   CULTURE, BLOOD (ROUTINE X 2) w Reflex to ID Panel     Status: None   Collection Time: 10/25/19  4:29 PM   Specimen: BLOOD  Result Value Ref Range Status   Specimen Description BLOOD CENTRAL LINE  Final   Special Requests   Final    BOTTLES DRAWN AEROBIC AND ANAEROBIC Blood Culture adequate volume   Culture   Final    NO GROWTH 5 DAYS Performed at [facility], [address] [place], [place] [address]    Report Status 10/30/2019 FINAL  Final  CULTURE, BLOOD (ROUTINE X 2) w Reflex to ID Panel     Status: None   Collection Time: 10/26/19  6:45 AM   Specimen: BLOOD  Result Value Ref Range Status   Specimen Description BLOOD BLOOD LEFT HAND  Final   Special Requests   Final    BOTTLES DRAWN AEROBIC AND ANAEROBIC Blood Culture adequate volume   Culture   Final    NO GROWTH 5 DAYS Performed at [facility], [address] [place], [place] [address]    Report Status 10/31/2019 FINAL  Final  Body fluid culture     Status: None   Collection Time: 10/26/19 11:30 AM   Specimen: Fluid  Result Value Ref Range Status   Specimen Description   Final    FLUID Performed at [facility], [address] [place], [place] [address]    Special Requests   Final    PERITONEAL Performed at [facility], [address] [place], [place] [address]    Gram Stain   Final    ABUNDANT WBC PRESENT, PREDOMINANTLY PMN NO ORGANISMS SEEN    Culture   Final    NO GROWTH Performed at [facility], [place] [address], [place], [place] [address]    Report Status 10/30/2019 FINAL  Final  Body fluid culture     Status: None (Preliminary result)   Collection Time: 10/29/19  1:39 PM   Specimen: PATH Cytology Peritoneal fluid  Result Value Ref Range Status   Specimen Description   Final    PERITONEAL Performed at [facility], [address] [place], [place] [address]    Special Requests   Final    NONE Performed at [facility], [address] [place], [place] [address]    Gram Stain   Final    MODERATE WBC PRESENT, PREDOMINANTLY PMN NO ORGANISMS SEEN    Culture   Final    NO GROWTH 2 DAYS Performed at [facility], [place] [address] [place], [place] [address]    Report Status PENDING  Incomplete         Radiology Studies: US Paracentesis  Result Date: 10/29/2019 INDICATION: Recurrent ascites, SBP, cirrhosis and end-stage renal disease. EXAM: ULTRASOUND GUIDED PARACENTESIS MEDICATIONS: None. COMPLICATIONS: None immediate. PROCEDURE: Informed written consent was obtained from the patient after a discussion of the risks, benefits and alternatives to treatment. A timeout was performed prior to the initiation of the procedure. Initial ultrasound scanning was performed to localize ascites. The ri left ght lower abdomen was prepped and draped in the usual sterile fashion. 1% lidocaine was used for local anesthesia. Following this, a 6 Fr Safe-T-Centesis catheter was introduced. An ultrasound image was saved for documentation purposes. The paracentesis was performed. The catheter was removed and a dressing was applied. The patient tolerated the procedure well without immediate post procedural complication. FINDINGS: A total of approximately 3.6 L of amber fluid was removed. IMPRESSION: Successful ultrasound-guided paracentesis yielding 3.6 liters of peritoneal fluid. Electronically Signed   By: Aletta Edouard M.D.   On: 10/29/2019 15:58         Scheduled Meds: . Chlorhexidine Gluconate Cloth  6 each Topical Daily  . furosemide  60 mg Intravenous BID  . mouth rinse  15 mL Mouth Rinse BID  . pantoprazole (PROTONIX) IV  40 mg Intravenous BID  . polyethylene glycol  17 g Oral Daily  . sodium chloride flush  3 mL Intravenous Q12H   Continuous Infusions: . sodium chloride 250 mL (10/31/19 0950)  . cefTRIAXone (ROCEPHIN)  IV 2 g (10/31/19 0951)     LOS: 12 days    Time spent: 35 minutes    Sharen Hones, MD [facility]   To contact the attending provider between 7A-7P or the covering provider during after hours 7P-7A, please log into the web site [email or web address] and access using universal [place] password for that web site. If you do not have the password, please call the hospital operator.  10/31/2019, 1:29 PM

## 2019-10-31 NOTE — Progress Notes (Signed)
Patient complains of loose stools. MD Zhang ordered Tums and Maalox. Patient refused Maalox and did take Tums. Patient requested something to help her rest. Also clearly agitated at the time and could not find anything she wanted to help calm her (ie tv, soda, ambulation, reposition etc). MD Zhang ordered Haldol. Patient agreeable to take Haldol and it was administered. Will reassess 30 minutes post admin time for efficacy.

## 2019-11-01 DIAGNOSIS — R197 Diarrhea, unspecified: Secondary | ICD-10-CM

## 2019-11-01 DIAGNOSIS — J9601 Acute respiratory failure with hypoxia: Secondary | ICD-10-CM | POA: Diagnosis not present

## 2019-11-01 DIAGNOSIS — I5041 Acute combined systolic (congestive) and diastolic (congestive) heart failure: Secondary | ICD-10-CM | POA: Diagnosis not present

## 2019-11-01 DIAGNOSIS — K652 Spontaneous bacterial peritonitis: Secondary | ICD-10-CM | POA: Diagnosis not present

## 2019-11-01 LAB — CBC WITH DIFFERENTIAL/PLATELET
Abs Immature Granulocytes: 0.08 10*3/uL — ABNORMAL HIGH (ref 0.00–0.07)
Basophils Absolute: 0 10*3/uL (ref 0.0–0.1)
Basophils Relative: 0 %
Eosinophils Absolute: 0.2 10*3/uL (ref 0.0–0.5)
Eosinophils Relative: 2 %
HCT: 36.1 % (ref 36.0–46.0)
Hemoglobin: 11.9 g/dL — ABNORMAL LOW (ref 12.0–15.0)
Immature Granulocytes: 1 %
Lymphocytes Relative: 9 %
Lymphs Abs: 0.9 10*3/uL (ref 0.7–4.0)
MCH: 23.4 pg — ABNORMAL LOW (ref 26.0–34.0)
MCHC: 33 g/dL (ref 30.0–36.0)
MCV: 71.1 fL — ABNORMAL LOW (ref 80.0–100.0)
Monocytes Absolute: 0.7 10*3/uL (ref 0.1–1.0)
Monocytes Relative: 7 %
Neutro Abs: 7.8 10*3/uL — ABNORMAL HIGH (ref 1.7–7.7)
Neutrophils Relative %: 81 %
Platelets: 295 10*3/uL (ref 150–400)
RBC: 5.08 MIL/uL (ref 3.87–5.11)
RDW: 22 % — ABNORMAL HIGH (ref 11.5–15.5)
Smear Review: NORMAL
WBC: 9.7 10*3/uL (ref 4.0–10.5)
nRBC: 0 % (ref 0.0–0.2)

## 2019-11-01 LAB — BASIC METABOLIC PANEL
Anion gap: 11 (ref 5–15)
BUN: 72 mg/dL — ABNORMAL HIGH (ref 8–23)
CO2: 29 mmol/L (ref 22–32)
Calcium: 9.2 mg/dL (ref 8.9–10.3)
Chloride: 96 mmol/L — ABNORMAL LOW (ref 98–111)
Creatinine, Ser: 3.5 mg/dL — ABNORMAL HIGH (ref 0.44–1.00)
GFR, Estimated: 14 mL/min — ABNORMAL LOW (ref >60)
Glucose, Bld: 109 mg/dL — ABNORMAL HIGH (ref 70–99)
Potassium: 3.4 mmol/L — ABNORMAL LOW (ref 3.5–5.1)
Sodium: 136 mmol/L (ref 135–145)

## 2019-11-01 MED ORDER — APIXABAN 5 MG PO TABS
5.0000 mg | ORAL_TABLET | Freq: Two times a day (BID) | ORAL | Status: DC
  Administered 2019-11-01 – 2019-11-02 (×3): 5 mg via ORAL
  Filled 2019-11-01 (×3): qty 1

## 2019-11-01 MED ORDER — LOPERAMIDE HCL 2 MG PO CAPS
2.0000 mg | ORAL_CAPSULE | ORAL | Status: DC | PRN
  Administered 2019-11-01 – 2019-11-09 (×3): 2 mg via ORAL
  Filled 2019-11-01 (×4): qty 1

## 2019-11-01 MED ORDER — PANTOPRAZOLE SODIUM 40 MG PO TBEC
40.0000 mg | DELAYED_RELEASE_TABLET | Freq: Two times a day (BID) | ORAL | Status: DC
  Administered 2019-11-01 – 2020-01-29 (×161): 40 mg via ORAL
  Filled 2019-11-01 (×168): qty 1

## 2019-11-01 MED ORDER — POTASSIUM CHLORIDE 20 MEQ PO PACK
40.0000 meq | PACK | Freq: Once | ORAL | Status: AC
  Administered 2019-11-01: 40 meq via ORAL
  Filled 2019-11-01: qty 2

## 2019-11-01 NOTE — Progress Notes (Signed)
Katelyn Darby, MD [address]  [place], [place] [address]  Main: [phone]  Fax: [phone] Pager: [phone]   Subjective: Patient reports abdominal pain as well as nonbloody diarrhea.  She had at least 5 episodes of loose stools since yesterday.  Has been afebrile and normotensive.  Objective: Vital signs in last 24 hours: Vitals:   10/31/19 1925 11/01/19 0357 11/01/19 0813 11/01/19 1140  BP: 114/82 (!) 106/58 110/69 98/62  Pulse: 91 62 69 71  Resp: 19 18 17 17   Temp: 97.6 F (36.4 C) 97.6 F (36.4 C) 97.8 F (36.6 C) 98.1 F (36.7 C)  TempSrc: Oral Oral Oral Oral  SpO2: 99% 98% 98% 95%  Weight:  95.8 kg    Height:       Weight change: -3.221 kg  Intake/Output Summary (Last 24 hours) at 11/01/2019 1345 Last data filed at 11/01/2019 1143 Gross per 24 hour  Intake 340 ml  Output 800 ml  Net -460 ml     Exam: Heart:: Regular rate and rhythm, S1S2 present or without murmur or extra heart sounds Lungs: normal and clear to auscultation Abdomen: soft, mild diffuse tenderness, normal bowel sounds   Lab Results: CBC Latest Ref Rng & Units 11/01/2019 10/29/2019 10/28/2019  WBC 4.0 - 10.5 K/uL 9.7 6.5 7.7  Hemoglobin 12.0 - 15.0 g/dL 11.9(L) 9.8(L) 10.0(L)  Hematocrit 36 - 46 % 36.1 29.3(L) 30.4(L)  Platelets 150 - 400 K/uL 295 229 222   CMP Latest Ref Rng & Units 11/01/2019 10/31/2019 10/30/2019  Glucose 70 - 99 mg/dL 109(H) 134(H) 194(H)  BUN 8 - 23 mg/dL 72(H) 77(H) 79(H)  Creatinine 0.44 - 1.00 mg/dL 3.50(H) 3.97(H) 4.16(H)  Sodium 135 - 145 mmol/L 136 137 136  Potassium 3.5 - 5.1 mmol/L 3.4(L) 3.6 4.0  Chloride 98 - 111 mmol/L 96(L) 100 99  CO2 22 - 32 mmol/L 29 27 24   Calcium 8.9 - 10.3 mg/dL 9.2 9.3 9.2  Total Protein 6.5 - 8.1 g/dL - - -  Total Bilirubin 0.3 - 1.2 mg/dL - - -  Alkaline Phos 38 - 126 U/L - - -  AST 15 - 41 U/L - - -  ALT 0 - 44 U/L - - -    Micro Results: Recent Results (from the past 240 hour(s))   MRSA PCR Screening     Status: None   Collection Time: 10/25/19  2:50 PM   Specimen: Nasopharyngeal  Result Value Ref Range Status   MRSA by PCR NEGATIVE NEGATIVE Final    Comment:        The GeneXpert MRSA Assay (FDA approved for NASAL specimens only), is one component of a comprehensive MRSA colonization surveillance program. It is not intended to diagnose MRSA infection nor to guide or monitor treatment for MRSA infections. Performed at [facility], [address] [place], [place] [address]   CULTURE, BLOOD (ROUTINE X 2) w Reflex to ID Panel     Status: None   Collection Time: 10/25/19  4:29 PM   Specimen: BLOOD  Result Value Ref Range Status   Specimen Description BLOOD CENTRAL LINE  Final   Special Requests   Final    BOTTLES DRAWN AEROBIC AND ANAEROBIC Blood Culture adequate volume   Culture   Final    NO GROWTH 5 DAYS Performed at [facility], [address] [place], [place] [address]    Report Status 10/30/2019 FINAL  Final  CULTURE, BLOOD (ROUTINE X 2) w  Reflex to ID Panel     Status: None   Collection Time: 10/26/19  6:45 AM   Specimen: BLOOD  Result Value Ref Range Status   Specimen Description BLOOD BLOOD LEFT HAND  Final   Special Requests   Final    BOTTLES DRAWN AEROBIC AND ANAEROBIC Blood Culture adequate volume   Culture   Final    NO GROWTH 5 DAYS Performed at [facility], [address], [place], [place] [address]    Report Status 10/31/2019 FINAL  Final  Body fluid culture     Status: None   Collection Time: 10/26/19 11:30 AM   Specimen: Fluid  Result Value Ref Range Status   Specimen Description   Final    FLUID Performed at [facility], [address], [place], [place] [address]    Special Requests   Final    PERITONEAL Performed at [facility], [address] [place], [place] [address]    Gram Stain   Final    ABUNDANT WBC PRESENT, PREDOMINANTLY PMN NO ORGANISMS SEEN    Culture    Final    NO GROWTH Performed at [facility], [place] [address] [place], [place] [address]    Report Status 10/30/2019 FINAL  Final  Body fluid culture     Status: None (Preliminary result)   Collection Time: 10/29/19  1:39 PM   Specimen: PATH Cytology Peritoneal fluid  Result Value Ref Range Status   Specimen Description   Final    PERITONEAL Performed at [facility], [address], [place], [place] [address]    Special Requests   Final    NONE Performed at [facility], [address] [place], [place] [address]    Gram Stain   Final    MODERATE WBC PRESENT, PREDOMINANTLY PMN NO ORGANISMS SEEN    Culture   Final    NO GROWTH 3 DAYS Performed at [facility], [place] [address], [place],  [address]    Report Status PENDING  Incomplete   Studies/Results: No results found. Medications:  I have reviewed the patient's current medications. Prior to Admission:  Medications Prior to Admission  Medication Sig Dispense Refill Last Dose  . albuterol (VENTOLIN HFA) 108 (90 Base) MCG/ACT inhaler Inhale 2 puffs into the lungs every 6 (six) hours as needed for wheezing or shortness of breath.   unknown at unknown  . allopurinol (ZYLOPRIM) 100 MG tablet Take 1 tablet (100 mg total) by mouth daily. 30 tablet 0   . ascorbic acid (VITAMIN C) 500 MG tablet Take 500 mg by mouth daily.   unknown at unknown  . aspirin 81 MG chewable tablet Chew 1 tablet (81 mg total) by mouth daily. 30 tablet 0 unknown at unknown  . atorvastatin (LIPITOR) 20 MG tablet Take 1 tablet (20 mg total) by mouth daily at 6 PM. 30 tablet 2   . benztropine (COGENTIN) 0.5 MG tablet Take 0.5 tablets (0.25 mg total) by mouth 2 (two) times daily. 30 tablet 0   . bumetanide (BUMEX) 1 MG tablet Take 1 tablet (1 mg total) by mouth 2 (two) times daily. May take additional dose for worsening leg swellings/shortness of breath 60 tablet 2   . carvedilol (COREG) 6.25 MG tablet Hold this medication  until you see your cardiologist 30 tablet 2 unknown at unknown  . docusate calcium (SURFAK) 240 MG capsule Take 240 mg by mouth daily as needed for moderate constipation.  (Patient  not taking: Reported on 07/27/2019)     . ferrous sulfate 325 (65 FE) MG tablet Take 1 tablet (325 mg total) by mouth daily with breakfast. 30 tablet 0   . FLUoxetine (PROZAC) 10 MG capsule Take 3 capsules (30 mg total) by mouth daily. 90 capsule 0   . gabapentin (NEURONTIN) 300 MG capsule Take 1 capsule (300 mg total) by mouth 3 (three) times daily. 90 capsule 0   . Ipratropium-Albuterol (COMBIVENT) 20-100 MCG/ACT AERS respimat Inhale 1 puff into the lungs every 6 (six) hours. 4 g 0 unknown at unknown  . loratadine (CLARITIN) 10 MG tablet Take 10 mg by mouth daily as needed for allergies.    unknown at unknown  . midodrine (PROAMATINE) 2.5 MG tablet Take 1 tablet (2.5 mg total) by mouth 3 (three) times daily with meals as needed (for MAP <65 and/or SBP <100). 30 tablet 0 unknown at unknown  . nitroGLYCERIN (NITROSTAT) 0.4 MG SL tablet Place 1 tablet (0.4 mg total) under the tongue every 5 (five) minutes as needed for chest pain. 30 tablet 1 prn at prn  . pantoprazole (PROTONIX) 40 MG tablet Take 1 tablet (40 mg total) by mouth daily. 30 tablet 0 unknown at unknown  . rivaroxaban (XARELTO) 20 MG TABS tablet Take 1 tablet (20 mg total) by mouth daily with supper. For the blood clot in your heart. 30 tablet 2   . sacubitril-valsartan (ENTRESTO) 49-51 MG Take 1 tablet by mouth 2 (two) times daily. 60 tablet 0 unknown at unknown  . spironolactone (ALDACTONE) 25 MG tablet Take 1 tablet (25 mg total) by mouth daily. 30 tablet 1   . spironolactone (ALDACTONE) 25 MG tablet Take 1 tablet (25 mg total) by mouth daily. Hold this medication until you see your cardiologist 30 tablet 1 unknown at unknown  . thiothixene (NAVANE) 1 MG capsule Take 1 capsule (1 mg total) by mouth 2 (two) times daily. 60 capsule 0    Scheduled: . apixaban   5 mg Oral BID  . Chlorhexidine Gluconate Cloth  6 each Topical Daily  . furosemide  60 mg Intravenous BID  . mouth rinse  15 mL Mouth Rinse BID  . melatonin  2.5 mg Oral QHS  . pantoprazole  40 mg Oral BID  . sodium chloride flush  3 mL Intravenous Q12H   Continuous: . sodium chloride 250 mL (11/01/19 0959)  . cefTRIAXone (ROCEPHIN)  IV 2 g (11/01/19 0959)   QMG:QQPYPP chloride, acetaminophen, albuterol, alum & mag hydroxide-simeth **AND** lidocaine, bisacodyl, calcium carbonate, Glycerin (Adult), loperamide, midodrine, ondansetron (ZOFRAN) IV, ondansetron (ZOFRAN) IV, oxyCODONE-acetaminophen, sodium chloride flush Anti-infectives (From admission, onward)   Start     Dose/Rate Route Frequency Ordered Stop   11/01/19 1000  cefTRIAXone (ROCEPHIN) 2 g in sodium chloride 0.9 % 100 mL IVPB        2 g 200 mL/hr over 30 Minutes Intravenous Daily 10/31/19 1521     10/27/19 1000  cefTRIAXone (ROCEPHIN) 2 g in sodium chloride 0.9 % 100 mL IVPB  Status:  Discontinued        2 g 200 mL/hr over 30 Minutes Intravenous Daily 10/26/19 1559 10/31/19 1521   10/26/19 2000  cefTRIAXone (ROCEPHIN) 1 g in sodium chloride 0.9 % 100 mL IVPB        1 g 200 mL/hr over 30 Minutes Intravenous  Once 10/26/19 1559 10/26/19 2024   10/26/19 1600  cefTRIAXone (ROCEPHIN) 2 g in sodium chloride 0.9 % 100 mL IVPB  Status:  Discontinued        2 g 200 mL/hr over 30 Minutes Intravenous Every 24 hours 10/26/19 1351 10/26/19 1600   10/26/19 1000  metroNIDAZOLE (FLAGYL) IVPB 500 mg  Status:  Discontinued        500 mg 100 mL/hr over 60 Minutes Intravenous Every 8 hours 10/26/19 0909 10/29/19 1138   10/25/19 1445  azithromycin (ZITHROMAX) 500 mg in sodium chloride 0.9 % 250 mL IVPB  Status:  Discontinued        500 mg 250 mL/hr over 60 Minutes Intravenous Every 24 hours 10/25/19 1353 10/29/19 1138   10/25/19 1230  azithromycin (ZITHROMAX) tablet 500 mg  Status:  Discontinued        500 mg Oral Daily 10/25/19 1133 10/25/19  1134   10/25/19 1030  cefTRIAXone (ROCEPHIN) 1 g in sodium chloride 0.9 % 100 mL IVPB  Status:  Discontinued        1 g 200 mL/hr over 30 Minutes Intravenous Every 12 hours 10/25/19 0931 10/26/19 1351     Scheduled Meds: . apixaban  5 mg Oral BID  . Chlorhexidine Gluconate Cloth  6 each Topical Daily  . furosemide  60 mg Intravenous BID  . mouth rinse  15 mL Mouth Rinse BID  . melatonin  2.5 mg Oral QHS  . pantoprazole  40 mg Oral BID  . sodium chloride flush  3 mL Intravenous Q12H   Continuous Infusions: . sodium chloride 250 mL (11/01/19 0959)  . cefTRIAXone (ROCEPHIN)  IV 2 g (11/01/19 0959)   PRN Meds:.sodium chloride, acetaminophen, albuterol, alum & mag hydroxide-simeth **AND** lidocaine, bisacodyl, calcium carbonate, Glycerin (Adult), loperamide, midodrine, ondansetron (ZOFRAN) IV, ondansetron (ZOFRAN) IV, oxyCODONE-acetaminophen, sodium chloride flush   Assessment: Principal Problem:   Dementia ([place]) Active Problems:   AKI (acute kidney injury) ([place])   Crack cocaine use   Acute respiratory failure with hypoxia (H[facility])   LV (left ventricular) mural thrombus   Acute CHF (congestive heart failure) (H[facility])   Cocaine abuse ([place])   Peripheral edema   Cirrhosis of liver (H[facility])   Spontaneous bacterial peritonitis ([place])   Plan: Cardiac cirrhosis with ascites presenting with spontaneous bacterial peritonitis Cultures have been negative so far Continue with ceftriaxone daily Recommend repeat fluid analysis for cell count and differential on day 5-7 of antibiotic to assess response to antibiotics  New onset of diarrhea Recommend to rule out C. Difficile as patient is on antibiotics  [facility] will cover from tomorrow    LOS: 13 days   Micaella Gitto 11/01/2019, 1:45 PM

## 2019-11-01 NOTE — Progress Notes (Signed)
Katelyn Lane  MRN: [id number]  DOB/AGE: 64-Aug-1957 64 y.o.  Primary Care Physician:Lane, Katelyn Jarvis, MD  Admit date: 10/19/2019  Chief Complaint:  Chief Complaint  Patient presents with  . Leg Swelling    S-Pt presented on  10/19/2019 with  Chief Complaint  Patient presents with  . Leg Swelling  . Patient offers no specific complaints.  I then discussed patient kidney related issues. Educated patient that her GFR/kidneys are better.  In case patient GFR trends lower tomorrow then will plan to DC/discontinue/take out the tunneled catheter in the chest.  Patient became emotional.  I empathized with the patient.  Medications . Chlorhexidine Gluconate Cloth  6 each Topical Daily  . furosemide  60 mg Intravenous BID  . mouth rinse  15 mL Mouth Rinse BID  . melatonin  2.5 mg Oral QHS  . pantoprazole (PROTONIX) IV  40 mg Intravenous BID  . sodium chloride flush  3 mL Intravenous Q12H         OAC:ZYSAY from the symptoms mentioned above,there are no other symptoms referable to all systems reviewed.  Physical Exam: Vital signs in last 24 hours: Temp:  [97.4 F (36.3 C)-97.8 F (36.6 C)] 97.8 F (36.6 C) (10/24 0813) Pulse Rate:  [62-91] 69 (10/24 0813) Resp:  [17-19] 17 (10/24 0813) BP: (105-114)/(58-82) 110/69 (10/24 0813) SpO2:  [96 %-99 %] 98 % (10/24 0813) Weight:  [95.8 kg] 95.8 kg (10/24 0357) Weight change: -3.221 kg Last BM Date: 10/31/19  Intake/Output from previous day: 10/23 0701 - 10/24 0700 In: 340 [P.O.:240; IV Piggyback:100] Out: 300 [Urine:300] No intake/output data recorded.   Physical Exam: General- pt is awake,alert, oriented to time place and person  Resp- No acute REsp distress, CTA B/L NO Rhonchi  CVS- S1S2 regular in rate and rhythm  GIT- BS+, soft, Non tender , Non distended  EXT- NO LE Edema, NO Cyanosis  Access-  tunneled cath   Lab Results:  CBC  Recent Labs    11/01/19 0633  WBC 9.7  HGB 11.9*  HCT 36.1  PLT  295    BMET  Recent Labs    10/31/19 0446 11/01/19 0633  NA 137 136  K 3.6 3.4*  CL 100 96*  CO2 27 29  GLUCOSE 134* 109*  BUN 77* 72*  CREATININE 3.97* 3.50*  CALCIUM 9.3 9.2      Most recent Creatinine trend  Lab Results  Component Value Date   CREATININE 3.50 (H) 11/01/2019   CREATININE 3.97 (H) 10/31/2019   CREATININE 4.16 (H) 10/30/2019    Creat trend 2021 1.8--4.0( Multiple admissions)  2020 1.7--2.3 ( Multiple admissions)  2014 1.54  MICRO   Recent Results (from the past 240 hour(s))  MRSA PCR Screening     Status: None   Collection Time: 10/25/19  2:50 PM   Specimen: Nasopharyngeal  Result Value Ref Range Status   MRSA by PCR NEGATIVE NEGATIVE Final    Comment:        The GeneXpert MRSA Assay (FDA approved for NASAL specimens only), is one component of a comprehensive MRSA colonization surveillance program. It is not intended to diagnose MRSA infection nor to guide or monitor treatment for MRSA infections. Performed at [facility], [address] [place], [place] [address]   CULTURE, BLOOD (ROUTINE X 2) w Reflex to ID Panel     Status: None   Collection Time: 10/25/19  4:29 PM   Specimen: BLOOD  Result Value Ref Range Status   Specimen  Description BLOOD CENTRAL LINE  Final   Special Requests   Final    BOTTLES DRAWN AEROBIC AND ANAEROBIC Blood Culture adequate volume   Culture   Final    NO GROWTH 5 DAYS Performed at [facility], [address] [place], [place] [address]    Report Status 10/30/2019 FINAL  Final  CULTURE, BLOOD (ROUTINE X 2) w Reflex to ID Panel     Status: None   Collection Time: 10/26/19  6:45 AM   Specimen: BLOOD  Result Value Ref Range Status   Specimen Description BLOOD BLOOD LEFT HAND  Final   Special Requests   Final    BOTTLES DRAWN AEROBIC AND ANAEROBIC Blood Culture adequate volume   Culture   Final    NO GROWTH 5 DAYS Performed at [facility], [address]  [place], [place] [address]    Report Status 10/31/2019 FINAL  Final  Body fluid culture     Status: None   Collection Time: 10/26/19 11:30 AM   Specimen: Fluid  Result Value Ref Range Status   Specimen Description   Final    FLUID Performed at [facility], [address] [place], [place] [address]    Special Requests   Final    PERITONEAL Performed at [facility], [address] [place], [place] [address]    Gram Stain   Final    ABUNDANT WBC PRESENT, PREDOMINANTLY PMN NO ORGANISMS SEEN    Culture   Final    NO GROWTH Performed at [facility], [place] [address], [place], [place] [address]    Report Status 10/30/2019 FINAL  Final  Body fluid culture     Status: None (Preliminary result)   Collection Time: 10/29/19  1:39 PM   Specimen: PATH Cytology Peritoneal fluid  Result Value Ref Range Status   Specimen Description   Final    PERITONEAL Performed at [facility], [address], [place], [place] [address]    Special Requests   Final    NONE Performed at [facility], [address] [place], [place] [address]    Gram Stain   Final    MODERATE WBC PRESENT, PREDOMINANTLY PMN NO ORGANISMS SEEN    Culture   Final    NO GROWTH 3 DAYS Performed at [facility], [place] [address] [place], [place] [address]    Report Status PENDING  Incomplete         Impression:   [name] is a 64 y.o. black female with congestive heart failure, hypertension, hyperlipidemia, gout, COPD, anemia, cocaine abuse who is admitted to [facility] on 10/19/2019 for Peripheral edema [R60.9] Acute CHF (congestive heart failure) . AKI (acute kidney injury) ( Congestive heart failure, unspecified HF chronicity, unspecified heart failure type .  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 CKD since 2014 CKD secondary to HTN Progression of CKD marked with multiple AKI   Patient did require dialysis during this  admission Patient had permacath placed as well Patient current urine output is adequate and patient creatinine is trending down without dialysis  Patient GFR is improving We will follow in the morning/in case it is better then will plan for discontinuing patient's permacath   2)hypotension Blood pressure is stable    3)Anemia of chronic disease  CBC Latest Ref Rng & Units 11/01/2019 10/29/2019 10/28/2019  WBC 4.0 - 10.5 K/uL 9.7 6.5 7.7  Hemoglobin 12.0 - 15.0 g/dL 11.9(L) 9.8(L) 10.0(L)  Hematocrit 36 - 46 % 36.1 29.3(L) 30.4(L)  Platelets 150 - 400 K/uL 295 229 222       HGb at goal (9--11)   4) Secondary hyperparathyroidism -CKD Mineral-Bone Disorder    Lab Results  Component Value Date   CALCIUM 9.2 11/01/2019   PHOS 4.2 10/25/2019    Secondary Hyperparathyroidism absent.  Phosphorus at goal.   5)Acute exacerbation of systolic and diastolic congestive heart failure.  Echocardiogram 06/22/19 EF of 25-30%.  -on 3 L nasal cannula, patient keeps it  as needed -Blood pressure readings stays at the low normal range -Continue Furosemide 60 mg iv BID Now better  6) Electrolytes   BMP Latest Ref Rng & Units 11/01/2019 10/31/2019 10/30/2019  Glucose 70 - 99 mg/dL 109(H) 134(H) 194(H)  BUN 8 - 23 mg/dL 72(H) 77(H) 79(H)  Creatinine 0.44 - 1.00 mg/dL 3.50(H) 3.97(H) 4.16(H)  Sodium 135 - 145 mmol/L 136 137 136  Potassium 3.5 - 5.1 mmol/L 3.4(L) 3.6 4.0  Chloride 98 - 111 mmol/L 96(L) 100 99  CO2 22 - 32 mmol/L 29 27 24   Calcium 8.9 - 10.3 mg/dL 9.2 9.3 9.2     Sodium Normonatremic   Potassium Normokalemic    7)Acid base Co2 at goal  8) Ascites/ Bacterial Peritonitis Patient is now clinically better GI  team managing    Plan:  No need for dialysis today  We will plan for permacath removal in the morning in case creatinine continues to trends down      Manley Fason s [facility] 11/01/2019, 10:21 AM

## 2019-11-01 NOTE — Progress Notes (Signed)
PROGRESS NOTE    Katelyn Lane  NLZ:[id number] DOB: 17-Jun-1955 DOA: 10/19/2019 PCP: [name], MD   Chief complaint.  Abdominal pain. 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 Found to have BNP greater than 4500, elevated troponin and AKI.  Multiple prior admissions with similar symptoms and complaints. Prolonged hospitalization in August 2021 when she was discharged to SNF. Not clear when she get out of SNF. Continues to complain that she is homeless but per social worker she do have a house. Not take her medication despite being provided with medical management.  After admission to the hospital, she was diagnosed with acute on chronic combinedsystolicanddiastoliccongestiveheartfailurewith ejection fraction 25 to 30%. She was not responding to IV Lasix. She was seen by nephrology, temporary dialysis catheter was performed and she was dialyzed on 10/17 and 10/18. Patient also complaining of abdominal pain, she had a paracentesis, study showed possible spontaneous peritonitis, she was treated with Rocephin and Flagyl.  Patient had a worsening respiratory status on 10/17, was diagnosed with aspiration pneumonia. She was treated with Rocephin, Zithromax and Flagyl.  10/21.Repeat paracentesis performed, antibiotic changed to Rocephin only. Permacath placed.  10/22.Repeated paracentesis showed white cell count of 3048, 69% neutrophils. However, patient clinically improving. Continue Rocephin.   Assessment & Plan:   Principal Problem:   Dementia ([place]) Active Problems:   AKI (acute kidney injury) ([place])   Crack cocaine use   Acute respiratory failure with hypoxia ([facility])    LV (left ventricular) mural thrombus   Acute CHF (congestive heart failure) ([facility])   Cocaine abuse ([place])   Peripheral edema   Cirrhosis of liver ([place])   Spontaneous bacterial peritonitis ([place])  #1.  Spontaneous bacterial peritonitis. Condition is improving.  Continue Rocephin.  #2.  Possible GI bleed. Hemoglobin is much better, no active bleeding.  Change Protonix to oral.  3.  Acute on chronic combined systolic and diastolic congestive heart failure, acute hypoxemic respiratory failure. Condition improving.  Patient finally off oxygen.  4.  Nonsustained patient tachycardia secondary to severe LV dysfunction. Patient had a one episode, no recurrence.  5.  Aspiration pneumonia. Improved.  6.  Liver cirrhosis with ascites. Followed by GI.  7.  Acute renal failure on chronic kidney disease stage IIIb. Renal function gradually improving.  Appreciated nephrology consult.  8.  Left ventricular thrombus and history of CVA. Patient hemoglobin has been stable since admission, no additional bleeding.  Is not safe to hold anticoagulation anymore.  Will restart Eliquis.    DVT prophylaxis: Eliquis Code Status: Full Family Communication: None  .   Status is: Inpatient  Remains inpatient appropriate because:Inpatient level of care appropriate due to severity of illness   Dispo: The patient is from: Home              Anticipated d/c is to: SNF              Anticipated d/c date is: 3 days              Patient currently is not medically stable to d/c.        I/O last 3 completed shifts: In: 74 [P.O.:480; IV Piggyback:100] Out: 300 [Urine:300] Total I/O In: 240 [P.O.:240] Out: 500 [Urine:500]     Consultants:   Nephrology, GI  Procedures: Paracentesis. Antimicrobials:  Rocephin.  Subjective: Patient doing better today, no additional abdominal pain or nausea vomiting. Spoke with the nurse, patient has not had a bowel movement today. No fever or chills. No  dysuria hematuria. No short of breath or cough.  Objective: Vitals:   10/31/19 1925 11/01/19 0357 11/01/19 0813 11/01/19 1140  BP: 114/82 (!) 106/58 110/69 98/62  Pulse: 91 62 69 71  Resp: 19 18 17 17   Temp: 97.6 F (36.4 C) 97.6 F (36.4 C) 97.8 F (36.6 C) 98.1 F (36.7 C)  TempSrc: Oral Oral Oral Oral  SpO2: 99% 98% 98% 95%  Weight:  95.8 kg    Height:        Intake/Output Summary (Last 24 hours) at 11/01/2019 1306 Last data filed at 11/01/2019 1143 Gross per 24 hour  Intake 340 ml  Output 800 ml  Net -460 ml   Filed Weights   10/30/19 0400 10/31/19 0254 11/01/19 0357  Weight: 102.4 kg 99 kg 95.8 kg    Examination:  General exam: Appears calm and comfortable  Respiratory system: Clear to auscultation. Respiratory effort normal. Cardiovascular system: S1 & S2 heard, RRR. No JVD, murmurs, rubs, gallops or clicks. No pedal edema. Gastrointestinal system: Abdomen is nondistended, soft and nontender. No organomegaly or masses felt. Normal bowel sounds heard. Central nervous system: Alert and oriented. No focal neurological deficits. Extremities: Symmetric 5 x 5 power. Skin: No rashes, lesions or ulcers Psychiatry:  Mood & affect appropriate.     Data Reviewed: I have personally reviewed following labs and imaging studies  CBC: Recent Labs  Lab 10/26/19 0645 10/27/19 0230 10/28/19 0503 10/29/19 0413 11/01/19 0633  WBC 10.7* 10.3 7.7 6.5 9.7  NEUTROABS  --   --   --   --  7.8*  HGB 11.4* 10.3* 10.0* 9.8* 11.9*  HCT 34.8* 31.4* 30.4* 29.3* 36.1  MCV 72.7* 73.0* 71.0* 71.3* 71.1*  PLT 260 245 222 229 633   Basic Metabolic Panel: Recent Labs  Lab 10/28/19 0503 10/29/19 0413 10/30/19 0550 10/31/19 0446 11/01/19 0633  NA 136 137 136 137 136  K 4.0 4.1 4.0 3.6 3.4*  CL 101 102 99 100 96*  CO2 25 24 24 27 29   GLUCOSE 104* 127* 194* 134* 109*  BUN 70* 73* 79* 77* 72*  CREATININE 4.01* 4.13* 4.16* 3.97* 3.50*  CALCIUM 9.0 8.8* 9.2 9.3 9.2    GFR: Estimated Creatinine Clearance: 19.2 mL/min (A) (by C-G formula based on SCr of 3.5 mg/dL (H)). Liver Function Tests: Recent Labs  Lab 10/26/19 0645 10/27/19 0230  AST 16 14*  ALT 8 8  ALKPHOS 73 68  BILITOT 2.2* 1.7*  PROT 5.4* 5.0*  ALBUMIN 2.6* 2.4*   No results for input(s): LIPASE, AMYLASE in the last 168 hours. No results for input(s): AMMONIA in the last 168 hours. Coagulation Profile: Recent Labs  Lab 10/27/19 0230  INR 2.0*   Cardiac Enzymes: No results for input(s): CKTOTAL, CKMB, CKMBINDEX, TROPONINI in the last 168 hours. BNP (last 3 results) No results for input(s): PROBNP in the last 8760 hours. HbA1C: No results for input(s): HGBA1C in the last 72 hours. CBG: No results for input(s): GLUCAP in the last 168 hours. Lipid Profile: No results for input(s): CHOL, HDL, LDLCALC, TRIG, CHOLHDL, LDLDIRECT in the last 72 hours. Thyroid Function Tests: No results for input(s): TSH, T4TOTAL, FREET4, T3FREE, THYROIDAB in the last 72 hours. Anemia Panel: No results for input(s): VITAMINB12, FOLATE, FERRITIN, TIBC, IRON, RETICCTPCT in the  last 72 hours. Sepsis Labs: Recent Labs  Lab 10/27/19 0320  LATICACIDVEN 1.0    Recent Results (from the past 240 hour(s))  MRSA PCR Screening     Status: None   Collection Time: 10/25/19  2:50 PM   Specimen: Nasopharyngeal  Result Value Ref Range Status   MRSA by PCR NEGATIVE NEGATIVE Final    Comment:        The GeneXpert MRSA Assay (FDA approved for NASAL specimens only), is one component of a comprehensive MRSA colonization surveillance program. It is not intended to diagnose MRSA infection nor to guide or monitor treatment for MRSA infections. Performed at [facility], [address] [place], [place] [address]   CULTURE, BLOOD (ROUTINE X 2) w Reflex to ID Panel     Status: None   Collection Time: 10/25/19  4:29 PM   Specimen: BLOOD  Result Value Ref Range Status   Specimen Description BLOOD  CENTRAL LINE  Final   Special Requests   Final    BOTTLES DRAWN AEROBIC AND ANAEROBIC Blood Culture adequate volume   Culture   Final    NO GROWTH 5 DAYS Performed at [facility], [address] [place], [place] [address]    Report Status 10/30/2019 FINAL  Final  CULTURE, BLOOD (ROUTINE X 2) w Reflex to ID Panel     Status: None   Collection Time: 10/26/19  6:45 AM   Specimen: BLOOD  Result Value Ref Range Status   Specimen Description BLOOD BLOOD LEFT HAND  Final   Special Requests   Final    BOTTLES DRAWN AEROBIC AND ANAEROBIC Blood Culture adequate volume   Culture   Final    NO GROWTH 5 DAYS Performed at [facility], [address], [place], [place] [address]    Report Status 10/31/2019 FINAL  Final  Body fluid culture     Status: None   Collection Time: 10/26/19 11:30 AM   Specimen: Fluid  Result Value Ref Range Status   Specimen Description   Final    FLUID Performed at [facility], [address], [place], [place] [address]    Special Requests   Final    PERITONEAL Performed at [facility], [address] [place], [place] [address]    Gram Stain   Final    ABUNDANT WBC PRESENT, PREDOMINANTLY PMN NO ORGANISMS SEEN    Culture   Final    NO GROWTH Performed at [facility], [place] [address], [place], [place] [address]    Report Status 10/30/2019 FINAL  Final  Body fluid culture     Status: None (Preliminary result)   Collection Time: 10/29/19  1:39 PM   Specimen: PATH Cytology Peritoneal fluid  Result Value Ref Range Status   Specimen Description   Final    PERITONEAL Performed at [facility], [address] [place], [place] [address]    Special Requests   Final    NONE Performed at [facility], [address] [place], [place] [address]    Gram Stain   Final    MODERATE WBC PRESENT, PREDOMINANTLY PMN NO ORGANISMS SEEN    Culture   Final    NO GROWTH 3 DAYS Performed at [facility], [place] [address] [place] [address], [place],  [address]    Report Status PENDING  Incomplete         Radiology Studies: No results found.      Scheduled Meds: .  Chlorhexidine Gluconate Cloth  6 each Topical Daily  . furosemide  60 mg Intravenous BID  . mouth rinse  15 mL Mouth Rinse BID  . melatonin  2.5 mg Oral QHS  . pantoprazole (PROTONIX) IV  40 mg Intravenous BID  . sodium chloride flush  3 mL Intravenous Q12H   Continuous Infusions: . sodium chloride 250 mL (11/01/19 0959)  . cefTRIAXone (ROCEPHIN)  IV 2 g (11/01/19 0959)     LOS: 13 days    Time spent: 28 minutes    Sharen Hones, MD [facility]   To contact the attending provider between 7A-7P or the covering provider during after hours 7P-7A, please log into the web site [email or web address] and access using universal [place] password for that web site. If you do not have the password, please call the hospital operator.  11/01/2019, 1:06 PM

## 2019-11-01 NOTE — Progress Notes (Signed)
ANTICOAGULATION CONSULT NOTE - Initial Consult  Pharmacy Consult for Apixaban   Indication: LV Thombus   Allergies  Allergen Reactions  . Penicillins Hives, Rash and Other (See Comments)    Did it involve swelling of the face/tongue/throat, SOB, or low BP? No Did it involve sudden or severe rash/hives, skin peeling, or any reaction on the inside of your mouth or nose? Yes Did you need to seek medical attention at a hospital or doctor's office? Yes When did it last happen? >25 years If all above answers are "NO", may proceed with cephalosporin use.   . Ace Inhibitors Nausea And Vomiting    Patient Measurements: Height: 5\' 6"  (167.6 cm) Weight: 95.8 kg (211 lb 1.6 oz) IBW/kg (Calculated) : 59.3  Vital Signs: Temp: 98.1 F (36.7 C) (10/24 1140) Temp Source: Oral (10/24 1140) BP: 98/62 (10/24 1140) Pulse Rate: 71 (10/24 1140)  Labs: Recent Labs    10/30/19 0550 10/31/19 0446 11/01/19 0633  HGB  --   --  11.9*  HCT  --   --  36.1  PLT  --   --  295  CREATININE 4.16* 3.97* 3.50*    Estimated Creatinine Clearance: 19.2 mL/min (A) (by C-G formula based on SCr of 3.5 mg/dL (H)).   Medical History: Past Medical History:  Diagnosis Date  . CHF (congestive heart failure) ([place])   . COVID-19   . Hyperlipidemia   . Hypertension   . Renal disorder     Assessment: 64 yo female admitted with SBP and possible GI bleed. Patient has PMH of LV thrombus, Liver cirrhosis, CVA, and CHF. Patient previously on Xarelto, which was held on admission for possible GI bleed. Pharmacy not consulted for apixaban dosing.    Plan:  Will start apixaban 5mg  every 12 hours.   Monitor CBC every 3 days per protocol.   Pernell Dupre, PharmD, BCPS Clinical Pharmacist 11/01/2019 1:28 PM

## 2019-11-01 NOTE — Plan of Care (Signed)

## 2019-11-01 NOTE — Progress Notes (Signed)
[facility] Encounter Note  Patient: Katelyn Lane / Admit Date: 10/19/2019 / Date of Encounter: 11/01/2019, 8:19 AM   Subjective: Patient overall has had no evidence of significant complaints today.  The patient still has trace to 1+ edema and some basilar crackles but no current complaints of angina or congestive heart failure symptoms..  The patient's blood pressure has been continuing to be low without ability to add any medication management for congestive heart failure and requires occasional midodrine as well at times but appears not to need at this time.  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  Her peritonitis appears to be relatively stable and slightly improved  Review of Systems: Positive for: Weakness Negative for: Vision change, hearing change, syncope, dizziness, nausea, vomiting,diarrhea, bloody stool, stomach pain, cough, congestion, diaphoresis, urinary frequency, urinary pain,skin lesions, skin rashes Others previously listed  Objective: Telemetry: Normal sinus rhythm Physical Exam: Blood pressure 110/69, pulse 69, temperature 97.8 F (36.6 C), temperature source Oral, resp. rate 17, height 5\' 6"  (1.676 m), weight 95.8 kg, SpO2 98 %. Body mass index is 34.07 kg/m. General: Well developed, well nourished, in no acute distress. Head: Normocephalic, atraumatic, sclera non-icteric, no xanthomas, nares are without discharge. Neck: No apparent masses Lungs: Normal respirations with no wheezes, few rhonchi, no rales , basilar crackles   Heart: Regular rate and rhythm, normal S1 S2, no murmur, no rub, no gallop, PMI is normal size and placement, carotid upstroke normal without bruit, jugular venous pressure normal Abdomen: Soft, non-tender, non-distended with normoactive  bowel sounds. No hepatosplenomegaly. Abdominal aorta is normal size without bruit Extremities: 1+ edema, no clubbing, no cyanosis, no ulcers,  Peripheral: 2+ radial, 2+ femoral, 2+ dorsal pedal pulses Neuro: Alert and oriented. Moves all extremities spontaneously. Psych:  Responds to questions appropriately with a normal affect.   Intake/Output Summary (Last 24 hours) at 11/01/2019 0819 Last data filed at 10/31/2019 1815 Gross per 24 hour  Intake 340 ml  Output 300 ml  Net 40 ml    Inpatient Medications:  . Chlorhexidine Gluconate Cloth  6 each Topical Daily  . furosemide  60 mg Intravenous BID  . mouth rinse  15 mL Mouth Rinse BID  . melatonin  2.5 mg Oral QHS  . pantoprazole (PROTONIX) IV  40 mg Intravenous BID  . potassium chloride  40 mEq Oral Once  . sodium chloride flush  3 mL Intravenous Q12H   Infusions:  . sodium chloride 250 mL (10/31/19 0950)  . cefTRIAXone (ROCEPHIN)  IV      Labs: Recent Labs    10/31/19 0446 11/01/19 0633  NA 137 136  K 3.6 3.4*  CL 100 96*  CO2 27 29  GLUCOSE 134* 109*  BUN 77* 72*  CREATININE 3.97* 3.50*  CALCIUM 9.3 9.2   No results for input(s): AST, ALT, ALKPHOS, BILITOT, PROT, ALBUMIN in the last 72 hours. Recent Labs    11/01/19 0633  WBC 9.7  NEUTROABS 7.8*  HGB 11.9*  HCT 36.1  MCV 71.1*  PLT 295   No results for input(s): CKTOTAL, CKMB, TROPONINI in the last 72 hours. Invalid input(s): POCBNP No results for input(s): HGBA1C in the last 72 hours.   Weights: Filed Weights   10/30/19 0400 10/31/19 0254 11/01/19 0357  Weight: 102.4 kg 99 kg 95.8 kg     Radiology/Studies:  CT ABDOMEN PELVIS WO CONTRAST  Result Date: 10/27/2019 CLINICAL DATA:  64 year old female with abdominal pain. Concern for peritonitis. EXAM: CT ABDOMEN AND PELVIS WITHOUT CONTRAST TECHNIQUE: Multidetector CT imaging of the abdomen and pelvis was performed following the standard protocol without IV contrast. COMPARISON:  CT abdomen pelvis dated  08/15/2019. FINDINGS: Evaluation of this exam is limited in the absence of intravenous contrast. Lower chest: Partially visualized small bilateral pleural effusions. There is partial consolidative changes of the right lower lobe and majority of the visualized left lower lobe which may represent atelectasis or pneumonia. Clinical correlation is recommended. There is moderate cardiomegaly. Coronary vascular calcifications noted. There is hypoattenuation of the cardiac blood pool suggestive of anemia. Clinical correlation is recommended. Moderate ascites, significantly increased since the prior CT. Hepatobiliary: Slight irregularity of the liver contour may represent early changes of cirrhosis. Clinical correlation is recommended. No intrahepatic biliary ductal dilatation. The gallbladder is predominantly contracted. Pancreas: The pancreas is grossly unremarkable. Spleen: Normal in size without focal abnormality. Adrenals/Urinary Tract: The adrenal glands are unremarkable. There is no hydronephrosis or nephrolithiasis on either side. The urinary bladder is decompressed around a Foley catheter. Stomach/Bowel: There is air within the wall of the gastric fundus consistent with pneumatosis and most concerning for bowel ischemia. Two small pockets of air in the periportal region and along the falciform ligament (35/2) most concerning for extraluminal air, possibly intravascular. Correlation with clinical exam and lactic acid levels recommended. Mildly dilated small bowel loops measure up to 4 cm in caliber. The distal small bowel are collapsed. A transition may be present in the midline anterior abdomen (63/2. Evaluation of the bowel is very limited due to anasarca. There is sigmoid diverticulosis without active inflammatory changes. The appendix is not identified. Vascular/Lymphatic: Mild aortoiliac atherosclerotic disease. The IVC is unremarkable. Right femoral central venous line with tip in the distal IVC. Reproductive:  Hysterectomy. Other: Severe diffuse subcutaneous edema and anasarca. Musculoskeletal: Degenerative changes of the spine. No acute osseous pathology. IMPRESSION: 1. Findings most concerning for gastric pneumatosis with small pockets of air likely in the portal vein suggestive of bowel ischemia. Correlation with clinical exam and lactic acid levels recommended. 2. Findings of bowel obstruction with transition in the midline abdomen. 3. Sigmoid diverticulosis. 4. Moderate ascites, significantly increased since the prior CT. 5. Severe diffuse subcutaneous edema and anasarca. 6. Partially visualized small bilateral pleural effusions with partial consolidative changes of the visualized lower lobes which may represent atelectasis or pneumonia. Clinical correlation is recommended. 7. Aortic Atherosclerosis (ICD10-I70.0). These results were called by telephone at the time of interpretation on 10/27/2019 at 12:52 am to Nurse Dossie Der, who verbally acknowledged these results. Electronically Signed   By: Anner Crete M.D.   On: 10/27/2019 01:05   DG Chest 2 View  Result Date: 10/19/2019 CLINICAL DATA:  Shortness of breath. EXAM: CHEST - 2 VIEW COMPARISON:  08/27/2019 and chest CT 08/15/2019 FINDINGS: Stable cardiomegaly. No overt pulmonary edema. Difficult to exclude densities at the left lung base and small left pleural effusion. Negative for a pneumothorax. IMPRESSION: Stable cardiomegaly with probable left basilar chest densities. Difficult to exclude a small left pleural effusion. Minimal change from the exam on 08/27/2019. Electronically Signed   By: Markus Daft M.D.   On: 10/19/2019 09:42   DG Abd 1 View  Result Date: 10/26/2019 CLINICAL DATA:  Abdominal pain and recent paracentesis EXAM: ABDOMEN - 1 VIEW COMPARISON:  10/24/2019 FINDINGS: Scattered large and small bowel gas is noted although predominately centrally  which suggests some persistent ascites despite recent paracentesis. Dialysis catheter is  noted in the right femoral vein extending into the IVC. This is new from the prior exam. No bony abnormality is seen. No obstructive changes are noted. IMPRESSION: Changes suggestive of mild residual ascites. No other focal acute abnormality is noted. Electronically Signed   By: Inez Catalina M.D.   On: 10/26/2019 18:59   US Abdomen Complete  Result Date: 10/24/2019 CLINICAL DATA:  Abdominal pain. Mildly elevated bilirubin and lipase 4 months. EXAM: ABDOMEN ULTRASOUND COMPLETE COMPARISON:  None. FINDINGS: Gallbladder: The gallbladder wall is thickened measuring up to 5.5 mm. No stones, sludge, or Murphy's sign identified. Common bile duct: Diameter: 2.9 mm Liver: Diffuse coarsened echotexture with a nodular contour. No focal mass. Portal vein is patent on color Doppler imaging with normal direction of blood flow towards the liver. IVC: No abnormality visualized. Pancreas: Limited visualization due to bowel gas shadowing. Limited views of the pancreas are normal. Spleen: Size and appearance within normal limits. Right Kidney: Length: 9.7 cm. Contains 2 probable small cysts measuring 1.7 and 1.4 cm. Left Kidney: Length: 10.3 cm. Echogenicity within normal limits. No mass or hydronephrosis visualized. Abdominal aorta: 3.1 cm proximally Other findings: Ascites is seen in all 4 quadrants. Evaluation is limited due to labored breathing. IMPRESSION: 1. The gallbladder wall is thickened which is nonspecific given apparent signs of cirrhosis and obvious ascites. The gallbladder is otherwise unremarkable. If there is concern for acute cholecystitis, recommend HIDA scan. 2. 4 quadrant ascites, moderate. 3. Coarsened echotexture to the liver with a nodular contour suggesting cirrhosis. 4. Mild prominence of the proximal abdominal aorta measuring 3.1 cm. 5. Small cyst in the right kidney. Electronically Signed   By: Dorise Bullion III M.D   On: 10/24/2019 10:28   US RENAL  Result Date: 10/20/2019 CLINICAL DATA:   64 year old female with acute renal failure. EXAM: RENAL / URINARY TRACT ULTRASOUND COMPLETE COMPARISON:  CT abdomen pelvis dated 08/15/2019. FINDINGS: Right Kidney: Renal measurements: 10.9 x 4.6 x 4.9 cm = volume: 127 mL. There is mild diffuse increased echogenicity. No hydronephrosis or shadowing stone. There is a 1.7 x 1.1 x 1.1 cm lateral interpolar cyst. Left Kidney: Renal measurements: 10.1 x 4.8 x 5.3 cm = volume: 132 mL. Mild increased echogenicity. No hydronephrosis or shadowing stone. Bladder: The bladder is partially visualized. Other: There is a small ascites and anasarca. IMPRESSION: 1. Increased renal parenchymal echogenicity in keeping with chronic kidney disease. No hydronephrosis or shadowing stone. 2. Small ascites. Electronically Signed   By: Anner Crete M.D.   On: 10/20/2019 17:59   PERIPHERAL VASCULAR CATHETERIZATION  Result Date: 10/29/2019 See op note  US Paracentesis  Result Date: 10/29/2019 INDICATION: Recurrent ascites, SBP, cirrhosis and end-stage renal disease. EXAM: ULTRASOUND GUIDED PARACENTESIS MEDICATIONS: None. COMPLICATIONS: None immediate. PROCEDURE: Informed written consent was obtained from the patient after a discussion of the risks, benefits and alternatives to treatment. A timeout was performed prior to the initiation of the procedure. Initial ultrasound scanning was performed to localize ascites. The ri left ght lower abdomen was prepped and draped in the usual sterile fashion. 1% lidocaine was used for local anesthesia. Following this, a 6 Fr Safe-T-Centesis catheter was introduced. An ultrasound image was saved for documentation purposes. The paracentesis was performed. The catheter was removed and a dressing was applied. The patient tolerated the procedure well without immediate post procedural complication. FINDINGS: A total of approximately 3.6 L of amber fluid was removed. IMPRESSION: Successful  ultrasound-guided paracentesis yielding 3.6 liters of  peritoneal fluid. Electronically Signed   By: Aletta Edouard M.D.   On: 10/29/2019 15:58   US Paracentesis  Result Date: 10/26/2019 INDICATION: Ascites, distension EXAM: ULTRASOUND GUIDED DIAGNOSTIC AND THERAPEUTIC PARACENTESIS MEDICATIONS: 1% LIDOCAINE LOCAL COMPLICATIONS: None immediate. PROCEDURE: Informed written consent was obtained from the patient's sister by phone and verified by ultrasound staff after a discussion of the risks, benefits and alternatives to treatment. A timeout was performed prior to the initiation of the procedure. Initial ultrasound scanning demonstrates a large amount of ascites within the left lower abdominal quadrant. The left lower abdomen was prepped and draped in the usual sterile fashion. 1% lidocaine was used for local anesthesia. Following this, a 6 Fr Safe-T-Centesis catheter was introduced. An ultrasound image was saved for documentation purposes. The paracentesis was performed. The catheter was removed and a dressing was applied. The patient tolerated the procedure well without immediate post procedural complication. FINDINGS: A total of approximately 6.4 L of yellow peritoneal fluid was removed. Samples were sent to the laboratory as requested by the clinical team. IMPRESSION: Successful ultrasound-guided paracentesis yielding 6.4 liters of peritoneal fluid. Electronically Signed   By: Jerilynn Mages.  Shick M.D.   On: 10/26/2019 12:36   DG Chest Port 1 View  Result Date: 10/24/2019 CLINICAL DATA:  63 year old female admitted with heart failure. EXAM: PORTABLE CHEST 1 VIEW COMPARISON:  10/19/2019 FINDINGS: Marked cardiomegaly, unchanged. Cephalization of the pulmonary vasculature. Streaky bibasilar pulmonary opacities. Blunting of left costophrenic angle. No pneumothorax. No acute osseous abnormality. IMPRESSION: Mild pulmonary edema and probable trace left pleural effusion. Stable cardiomegaly. Electronically Signed   By: Ruthann Cancer MD   On: 10/24/2019 12:00   DG ABD ACUTE  2+V W 1V CHEST  Result Date: 10/25/2019 CLINICAL DATA:  Shortness of breath EXAM: DG ABDOMEN ACUTE W/ 1V CHEST COMPARISON:  02/21/2019 FINDINGS: Shallow lung inflation with mild cardiomegaly and left basilar consolidation. Bowel gas pattern is nonobstructive. No free intraperitoneal air. IMPRESSION: Shallow lung inflation with left basilar consolidation, which may indicate atelectasis or pneumonia. Electronically Signed   By: Ulyses Jarred M.D.   On: 10/25/2019 00:21     Assessment and Recommendation  64 y.o. 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  No additional medication management due to significant hypotension and other side effects of medication and unfortunately cannot press further on medication management until further resolution of hypotension and acute currently injury although currently has not had any significant symptoms of chest pain or shortness of breath 3.  Continue social work evaluation for possible placement due to recurrent episodes of hospital admissions with unfortunate social situation  Signed, Serafina Royals M.D. FACC

## 2019-11-02 ENCOUNTER — Inpatient Hospital Stay: Payer: Medicaid Other

## 2019-11-02 DIAGNOSIS — I5041 Acute combined systolic (congestive) and diastolic (congestive) heart failure: Secondary | ICD-10-CM | POA: Diagnosis not present

## 2019-11-02 DIAGNOSIS — F1027 Alcohol dependence with alcohol-induced persisting dementia: Secondary | ICD-10-CM | POA: Diagnosis not present

## 2019-11-02 DIAGNOSIS — N179 Acute kidney failure, unspecified: Secondary | ICD-10-CM | POA: Diagnosis not present

## 2019-11-02 DIAGNOSIS — J690 Pneumonitis due to inhalation of food and vomit: Secondary | ICD-10-CM

## 2019-11-02 DIAGNOSIS — J9601 Acute respiratory failure with hypoxia: Secondary | ICD-10-CM | POA: Diagnosis not present

## 2019-11-02 LAB — CBC WITH DIFFERENTIAL/PLATELET
Abs Immature Granulocytes: 0.08 10*3/uL — ABNORMAL HIGH (ref 0.00–0.07)
Basophils Absolute: 0 10*3/uL (ref 0.0–0.1)
Basophils Relative: 0 %
Eosinophils Absolute: 0.1 10*3/uL (ref 0.0–0.5)
Eosinophils Relative: 1 %
HCT: 31 % — ABNORMAL LOW (ref 36.0–46.0)
Hemoglobin: 10.6 g/dL — ABNORMAL LOW (ref 12.0–15.0)
Immature Granulocytes: 1 %
Lymphocytes Relative: 6 %
Lymphs Abs: 0.8 10*3/uL (ref 0.7–4.0)
MCH: 23.8 pg — ABNORMAL LOW (ref 26.0–34.0)
MCHC: 34.2 g/dL (ref 30.0–36.0)
MCV: 69.7 fL — ABNORMAL LOW (ref 80.0–100.0)
Monocytes Absolute: 1.3 10*3/uL — ABNORMAL HIGH (ref 0.1–1.0)
Monocytes Relative: 10 %
Neutro Abs: 10.6 10*3/uL — ABNORMAL HIGH (ref 1.7–7.7)
Neutrophils Relative %: 82 %
Platelets: 296 10*3/uL (ref 150–400)
RBC: 4.45 MIL/uL (ref 3.87–5.11)
RDW: 21.3 % — ABNORMAL HIGH (ref 11.5–15.5)
Smear Review: NORMAL
WBC: 12.9 10*3/uL — ABNORMAL HIGH (ref 4.0–10.5)
nRBC: 0 % (ref 0.0–0.2)

## 2019-11-02 LAB — BASIC METABOLIC PANEL
Anion gap: 11 (ref 5–15)
BUN: 71 mg/dL — ABNORMAL HIGH (ref 8–23)
CO2: 27 mmol/L (ref 22–32)
Calcium: 8.3 mg/dL — ABNORMAL LOW (ref 8.9–10.3)
Chloride: 92 mmol/L — ABNORMAL LOW (ref 98–111)
Creatinine, Ser: 3.67 mg/dL — ABNORMAL HIGH (ref 0.44–1.00)
GFR, Estimated: 13 mL/min — ABNORMAL LOW (ref >60)
Glucose, Bld: 132 mg/dL — ABNORMAL HIGH (ref 70–99)
Potassium: 3.2 mmol/L — ABNORMAL LOW (ref 3.5–5.1)
Sodium: 130 mmol/L — ABNORMAL LOW (ref 135–145)

## 2019-11-02 LAB — CYTOLOGY - NON PAP

## 2019-11-02 LAB — BODY FLUID CELL COUNT WITH DIFFERENTIAL
Eos, Fluid: 0 %
Lymphs, Fluid: 9 %
Monocyte-Macrophage-Serous Fluid: 4 %
Neutrophil Count, Fluid: 82 %
Total Nucleated Cell Count, Fluid: 1636 cu mm

## 2019-11-02 LAB — BODY FLUID CULTURE: Culture: NO GROWTH

## 2019-11-02 LAB — GRAM STAIN

## 2019-11-02 LAB — MAGNESIUM: Magnesium: 1.7 mg/dL (ref 1.7–2.4)

## 2019-11-02 MED ORDER — POTASSIUM CHLORIDE 20 MEQ PO PACK
40.0000 meq | PACK | Freq: Once | ORAL | Status: DC
Start: 2019-11-02 — End: 2019-11-02

## 2019-11-02 MED ORDER — ALBUMIN HUMAN 25 % IV SOLN
50.0000 g | Freq: Once | INTRAVENOUS | Status: AC
  Administered 2019-11-02: 50 g via INTRAVENOUS
  Filled 2019-11-02: qty 200

## 2019-11-02 NOTE — Procedures (Signed)
PROCEDURE SUMMARY:  Successful image-guided paracentesis from the left lower abdomen.  Yielded 1.0 liters of clear, golden yellow fluid. Of note, peritoneal fluid appears to be more loculated on today's exam than previous paracentesis which limits the amount of fluid able to be removed. No immediate complications.  EBL: zero Patient tolerated well.   Specimen was sent for labs.  Please see imaging section of Epic for full dictation.  Joaquim Nam PA-C 11/02/2019 3:27 PM

## 2019-11-02 NOTE — Progress Notes (Signed)
GI Inpatient Follow-up Note  Subjective:  Patient seen in follow-up for cirrhosis with cardiac ascites c/b SBP on IV Ceftriaxone. No acute events overnight. She has had 1 BM today. She does complaint of generalized abdominal pain, left > right today. She denies any new complaints of fever, nausea, vomiting, or rectal bleeding. Paracentesis is planned today.   Scheduled Inpatient Medications:  . Chlorhexidine Gluconate Cloth  6 each Topical Daily  . furosemide  60 mg Intravenous BID  . mouth rinse  15 mL Mouth Rinse BID  . melatonin  2.5 mg Oral QHS  . pantoprazole  40 mg Oral BID  . sodium chloride flush  3 mL Intravenous Q12H    Continuous Inpatient Infusions:   . sodium chloride Stopped (11/01/19 1131)  . albumin human    . cefTRIAXone (ROCEPHIN)  IV 2 g (11/02/19 0831)    PRN Inpatient Medications:  sodium chloride, acetaminophen, albuterol, alum & mag hydroxide-simeth **AND** lidocaine, bisacodyl, calcium carbonate, Glycerin (Adult), loperamide, midodrine, ondansetron (ZOFRAN) IV, ondansetron (ZOFRAN) IV, oxyCODONE-acetaminophen, sodium chloride flush  Review of Systems: Constitutional: Weight is stable.  Eyes: No changes in vision. ENT: No oral lesions, sore throat.  GI: see HPI.  Heme/Lymph: No easy bruising.  CV: No chest pain.  GU: No hematuria.  Integumentary: No rashes.  Neuro: No headaches.  Psych: No depression/anxiety.  Endocrine: No heat/cold intolerance.  Allergic/Immunologic: No urticaria.  Resp: No cough, SOB.  Musculoskeletal: No joint swelling.    Physical Examination: BP (!) 86/74 (BP Location: Left Arm)   Pulse 96   Temp 97.9 F (36.6 C) (Oral)   Resp 16   Ht 5\' 6"  (1.676 m)   Wt 97.8 kg   SpO2 97%   BMI 34.80 kg/m  XNA:TFTDD, somewhat confused. Answers questions appropriately.  HEENT: PEERLA, EOMI, Neck: supple, no JVD or thyromegaly Chest: CTA bilaterally, no wheezes, crackles, or other adventitious sounds CV: RRR, no m/g/c/r Abd: soft,  ND, +BS in all four quadrants;tenderness to deep palpation in LUQ and LLQ,no HSM, guarding, ridigity, or rebound tenderness Ext: no edema, well perfused with 2+ pulses, Skin: no rash or lesions noted Lymph: no LAD  Data: Lab Results  Component Value Date   WBC 12.9 (H) 11/02/2019   HGB 10.6 (L) 11/02/2019   HCT 31.0 (L) 11/02/2019   MCV 69.7 (L) 11/02/2019   PLT 296 11/02/2019   Recent Labs  Lab 10/29/19 0413 11/01/19 0633 11/02/19 0924  HGB 9.8* 11.9* 10.6*   Lab Results  Component Value Date   NA 130 (L) 11/02/2019   K 3.2 (L) 11/02/2019   CL 92 (L) 11/02/2019   CO2 27 11/02/2019   BUN 71 (H) 11/02/2019   CREATININE 3.67 (H) 11/02/2019   Lab Results  Component Value Date   ALT 8 10/27/2019   AST 14 (L) 10/27/2019   ALKPHOS 68 10/27/2019   BILITOT 1.7 (H) 10/27/2019   Recent Labs  Lab 10/27/19 0230  INR 2.0*   Assessment:  1. Cirrhosis with cardiac ascites c/b SBP - repeat paracentesis Thursday showed cell count 3048, 69% neutrophils. Culture with predominant PMN, no organisms seen. On IV Rocephin. Paracentesis planned for this afternoon.  2. UGI bleed - historical - Hgb stable. No overt bleeding.   3. Hypotension - Relative to renal failure, cirrhosis, now concerned about sepsis given peritoneal fluid analysis. General Surgery evaluated and signed off.   4. CHF. Cardiology following. She is receiving IV Lasix BID  5. Polysubtance abuse.   Plan: 1.Cultures with  small improvements in cell count, but still consistent with SBP (69% neutrophils).  2. Continue IV Rocephin 2 g daily 3. Follow-up on results of paracentesis 4. Monitor H/H. Currently hemodynamically stable with no signs of overt GIB 5. Following  Please call with questions or concerns.    Octavia Bruckner, PA-C [facility] [phone] (Cell)

## 2019-11-02 NOTE — Progress Notes (Signed)
[facility] Encounter Note  Patient: Katelyn Lane / Admit Date: 10/19/2019 / Date of Encounter: 11/02/2019, 1:07 PM   Subjective: Patient overall has had no evidence of significant complaints today.  The patient still has trace to 1+ edema and some basilar crackles but no current complaints of angina or congestive heart failure symptoms..  The patient's blood pressure has been continuing to be low without ability to add any medication management for congestive heart failure and requires occasional midodrine as well at times but appears not to need at this time.  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  After previous dialysis use the patient has stabilized with renal dysfunction and requires no dialysis at this time.  Her peritonitis appears to be relatively stable and slightly improved as per gastroenterology.  Telemetry strips are reviewed showing the patient is having frequent preventricular contractions as well as some short runs of ventricular tachycardia asymptomatic at this time.  Review of Systems: Positive for: Weakness Negative for: Vision change, hearing change, syncope, dizziness, nausea, vomiting,diarrhea, bloody stool, stomach pain, cough, congestion, diaphoresis, urinary frequency, urinary pain,skin lesions, skin rashes Others previously listed  Objective: Telemetry: Normal sinus rhythm Physical Exam: Blood pressure 94/62, pulse 64, temperature 97.9 F (36.6 C), temperature source Oral, resp. rate 16, height 5\' 6"  (1.676 m), weight 97.8 kg, SpO2 96 %. Body mass index is 34.8 kg/m. General: Well developed, well nourished, in no acute distress. Head: Normocephalic, atraumatic, sclera non-icteric, no xanthomas, nares are without discharge. Neck: No apparent masses Lungs:  Normal respirations with no wheezes, few rhonchi, no rales , basilar crackles   Heart: Regular rate and rhythm, normal S1 S2, no murmur, no rub, no gallop, PMI is normal size and placement, carotid upstroke normal without bruit, jugular venous pressure normal Abdomen: Soft, non-tender, non-distended with normoactive bowel sounds. No hepatosplenomegaly. Abdominal aorta is normal size without bruit Extremities: 1+ edema, no clubbing, no cyanosis, no ulcers,  Peripheral: 2+ radial, 2+ femoral, 2+ dorsal pedal pulses Neuro: Alert and oriented. Moves all extremities spontaneously. Psych:  Responds to questions appropriately with a normal affect.   Intake/Output Summary (Last 24 hours) at 11/02/2019 1307 Last data filed at 11/02/2019 1249 Gross per 24 hour  Intake 1591.61 ml  Output 1800 ml  Net -208.39 ml    Inpatient Medications:  . apixaban  5 mg Oral BID  . Chlorhexidine Gluconate Cloth  6 each Topical Daily  . furosemide  60 mg Intravenous BID  . mouth rinse  15 mL Mouth Rinse BID  . melatonin  2.5 mg Oral QHS  . pantoprazole  40 mg Oral BID  . sodium chloride flush  3 mL Intravenous Q12H   Infusions:  . sodium chloride Stopped (11/01/19 1131)  . cefTRIAXone (ROCEPHIN)  IV 2 g (11/02/19 0831)    Labs: Recent Labs    11/01/19 0633 11/02/19 0924  NA 136 130*  K 3.4* 3.2*  CL 96* 92*  CO2 29 27  GLUCOSE 109* 132*  BUN 72* 71*  CREATININE 3.50* 3.67*  CALCIUM 9.2 8.3*  MG  --  1.7   No results for input(s): AST, ALT, ALKPHOS, BILITOT, PROT, ALBUMIN in the last 72 hours. Recent Labs    11/01/19 0633 11/02/19 0924  WBC 9.7 12.9*  NEUTROABS 7.8* 10.6*  HGB 11.9* 10.6*  HCT 36.1 31.0*  MCV 71.1* 69.7*  PLT 295 296   No results for input(s): CKTOTAL, CKMB, TROPONINI in the last 72 hours. Invalid input(s): POCBNP No results for input(s): HGBA1C in the last 72 hours.   Weights: Filed Weights   10/31/19 0254 11/01/19 0357 11/02/19 0620  Weight: 99 kg 95.8 kg 97.8  kg     Radiology/Studies:  CT ABDOMEN PELVIS WO CONTRAST  Result Date: 10/27/2019 CLINICAL DATA:  64 year old female with abdominal pain. Concern for peritonitis. EXAM: CT ABDOMEN AND PELVIS WITHOUT CONTRAST TECHNIQUE: Multidetector CT imaging of the abdomen and pelvis was performed following the standard protocol without IV contrast. COMPARISON:  CT abdomen pelvis dated 08/15/2019. FINDINGS: Evaluation of this exam is limited in the absence of intravenous contrast. Lower chest: Partially visualized small bilateral pleural effusions. There is partial consolidative changes of the right lower lobe and majority of the visualized left lower lobe which may represent atelectasis or pneumonia. Clinical correlation is recommended. There is moderate cardiomegaly. Coronary vascular calcifications noted. There is hypoattenuation of the cardiac blood pool suggestive of anemia. Clinical correlation is recommended. Moderate ascites, significantly increased since the prior CT. Hepatobiliary: Slight irregularity of the liver contour may represent early changes of cirrhosis. Clinical correlation is recommended. No intrahepatic biliary ductal dilatation. The gallbladder is predominantly contracted. Pancreas: The pancreas is grossly unremarkable. Spleen: Normal in size without focal abnormality. Adrenals/Urinary Tract: The adrenal glands are unremarkable. There is no hydronephrosis or nephrolithiasis on either side. The urinary bladder is decompressed around a Foley catheter. Stomach/Bowel: There is air within the wall of the gastric fundus consistent with pneumatosis and most concerning for bowel ischemia. Two small pockets of air in the periportal region and along the falciform ligament (35/2) most concerning for extraluminal air, possibly intravascular. Correlation with clinical exam and lactic acid levels recommended. Mildly dilated small bowel loops measure up to 4 cm in caliber. The distal small bowel are collapsed. A  transition may be present in the midline anterior abdomen (63/2. Evaluation of the bowel is very limited due to anasarca. There is sigmoid diverticulosis without active inflammatory changes. The appendix is not identified. Vascular/Lymphatic: Mild aortoiliac atherosclerotic disease. The IVC is unremarkable. Right femoral central venous line with tip in the distal IVC. Reproductive: Hysterectomy. Other: Severe diffuse subcutaneous edema and anasarca. Musculoskeletal: Degenerative changes of the spine. No acute osseous pathology. IMPRESSION: 1. Findings most concerning for gastric pneumatosis with small pockets of air likely in the portal vein suggestive of bowel ischemia. Correlation with clinical exam and lactic acid levels recommended. 2. Findings of bowel obstruction with transition in the midline abdomen. 3. Sigmoid diverticulosis. 4. Moderate ascites, significantly increased since the prior CT. 5. Severe diffuse subcutaneous edema and anasarca. 6. Partially visualized small bilateral pleural effusions with partial consolidative changes of the visualized lower lobes which may represent atelectasis or pneumonia. Clinical correlation is recommended. 7. Aortic Atherosclerosis (ICD10-I70.0). These results were called by telephone at the time of interpretation on 10/27/2019 at 12:52 am to Nurse Dossie Der, who verbally acknowledged these results. Electronically Signed   By: Anner Crete M.D.   On: 10/27/2019 01:05   DG Chest 2 View  Result Date: 10/19/2019 CLINICAL DATA:  Shortness of breath. EXAM: CHEST - 2 VIEW COMPARISON:  08/27/2019 and chest CT 08/15/2019 FINDINGS: Stable cardiomegaly. No overt pulmonary edema. Difficult to exclude densities at the left lung base and small left pleural effusion. Negative for a pneumothorax. IMPRESSION: Stable cardiomegaly with probable left basilar chest densities. Difficult to exclude a small left  pleural effusion. Minimal change from the exam on 08/27/2019.  Electronically Signed   By: Markus Daft M.D.   On: 10/19/2019 09:42   DG Abd 1 View  Result Date: 10/26/2019 CLINICAL DATA:  Abdominal pain and recent paracentesis EXAM: ABDOMEN - 1 VIEW COMPARISON:  10/24/2019 FINDINGS: Scattered large and small bowel gas is noted although predominately centrally which suggests some persistent ascites despite recent paracentesis. Dialysis catheter is noted in the right femoral vein extending into the IVC. This is new from the prior exam. No bony abnormality is seen. No obstructive changes are noted. IMPRESSION: Changes suggestive of mild residual ascites. No other focal acute abnormality is noted. Electronically Signed   By: Inez Catalina M.D.   On: 10/26/2019 18:59   US Abdomen Complete  Result Date: 10/24/2019 CLINICAL DATA:  Abdominal pain. Mildly elevated bilirubin and lipase 4 months. EXAM: ABDOMEN ULTRASOUND COMPLETE COMPARISON:  None. FINDINGS: Gallbladder: The gallbladder wall is thickened measuring up to 5.5 mm. No stones, sludge, or Murphy's sign identified. Common bile duct: Diameter: 2.9 mm Liver: Diffuse coarsened echotexture with a nodular contour. No focal mass. Portal vein is patent on color Doppler imaging with normal direction of blood flow towards the liver. IVC: No abnormality visualized. Pancreas: Limited visualization due to bowel gas shadowing. Limited views of the pancreas are normal. Spleen: Size and appearance within normal limits. Right Kidney: Length: 9.7 cm. Contains 2 probable small cysts measuring 1.7 and 1.4 cm. Left Kidney: Length: 10.3 cm. Echogenicity within normal limits. No mass or hydronephrosis visualized. Abdominal aorta: 3.1 cm proximally Other findings: Ascites is seen in all 4 quadrants. Evaluation is limited due to labored breathing. IMPRESSION: 1. The gallbladder wall is thickened which is nonspecific given apparent signs of cirrhosis and obvious ascites. The gallbladder is otherwise unremarkable. If there is concern for acute  cholecystitis, recommend HIDA scan. 2. 4 quadrant ascites, moderate. 3. Coarsened echotexture to the liver with a nodular contour suggesting cirrhosis. 4. Mild prominence of the proximal abdominal aorta measuring 3.1 cm. 5. Small cyst in the right kidney. Electronically Signed   By: Dorise Bullion III M.D   On: 10/24/2019 10:28   US RENAL  Result Date: 10/20/2019 CLINICAL DATA:  64 year old female with acute renal failure. EXAM: RENAL / URINARY TRACT ULTRASOUND COMPLETE COMPARISON:  CT abdomen pelvis dated 08/15/2019. FINDINGS: Right Kidney: Renal measurements: 10.9 x 4.6 x 4.9 cm = volume: 127 mL. There is mild diffuse increased echogenicity. No hydronephrosis or shadowing stone. There is a 1.7 x 1.1 x 1.1 cm lateral interpolar cyst. Left Kidney: Renal measurements: 10.1 x 4.8 x 5.3 cm = volume: 132 mL. Mild increased echogenicity. No hydronephrosis or shadowing stone. Bladder: The bladder is partially visualized. Other: There is a small ascites and anasarca. IMPRESSION: 1. Increased renal parenchymal echogenicity in keeping with chronic kidney disease. No hydronephrosis or shadowing stone. 2. Small ascites. Electronically Signed   By: Anner Crete M.D.   On: 10/20/2019 17:59   PERIPHERAL VASCULAR CATHETERIZATION  Result Date: 10/29/2019 See op note  US Paracentesis  Result Date: 10/29/2019 INDICATION: Recurrent ascites, SBP, cirrhosis and end-stage renal disease. EXAM: ULTRASOUND GUIDED PARACENTESIS MEDICATIONS: None. COMPLICATIONS: None immediate. PROCEDURE: Informed written consent was obtained from the patient after a discussion of the risks, benefits and alternatives to treatment. A timeout was performed prior to the initiation of the procedure. Initial ultrasound scanning was performed to localize ascites. The ri left ght lower abdomen was prepped and draped in the usual sterile fashion. 1%  lidocaine was used for local anesthesia. Following this, a 6 Fr Safe-T-Centesis catheter was  introduced. An ultrasound image was saved for documentation purposes. The paracentesis was performed. The catheter was removed and a dressing was applied. The patient tolerated the procedure well without immediate post procedural complication. FINDINGS: A total of approximately 3.6 L of amber fluid was removed. IMPRESSION: Successful ultrasound-guided paracentesis yielding 3.6 liters of peritoneal fluid. Electronically Signed   By: Aletta Edouard M.D.   On: 10/29/2019 15:58   US Paracentesis  Result Date: 10/26/2019 INDICATION: Ascites, distension EXAM: ULTRASOUND GUIDED DIAGNOSTIC AND THERAPEUTIC PARACENTESIS MEDICATIONS: 1% LIDOCAINE LOCAL COMPLICATIONS: None immediate. PROCEDURE: Informed written consent was obtained from the patient's sister by phone and verified by ultrasound staff after a discussion of the risks, benefits and alternatives to treatment. A timeout was performed prior to the initiation of the procedure. Initial ultrasound scanning demonstrates a large amount of ascites within the left lower abdominal quadrant. The left lower abdomen was prepped and draped in the usual sterile fashion. 1% lidocaine was used for local anesthesia. Following this, a 6 Fr Safe-T-Centesis catheter was introduced. An ultrasound image was saved for documentation purposes. The paracentesis was performed. The catheter was removed and a dressing was applied. The patient tolerated the procedure well without immediate post procedural complication. FINDINGS: A total of approximately 6.4 L of yellow peritoneal fluid was removed. Samples were sent to the laboratory as requested by the clinical team. IMPRESSION: Successful ultrasound-guided paracentesis yielding 6.4 liters of peritoneal fluid. Electronically Signed   By: Jerilynn Mages.  Shick M.D.   On: 10/26/2019 12:36   DG Chest Port 1 View  Result Date: 10/24/2019 CLINICAL DATA:  64 year old female admitted with heart failure. EXAM: PORTABLE CHEST 1 VIEW COMPARISON:  10/19/2019  FINDINGS: Marked cardiomegaly, unchanged. Cephalization of the pulmonary vasculature. Streaky bibasilar pulmonary opacities. Blunting of left costophrenic angle. No pneumothorax. No acute osseous abnormality. IMPRESSION: Mild pulmonary edema and probable trace left pleural effusion. Stable cardiomegaly. Electronically Signed   By: Ruthann Cancer MD   On: 10/24/2019 12:00   DG ABD ACUTE 2+V W 1V CHEST  Result Date: 10/25/2019 CLINICAL DATA:  Shortness of breath EXAM: DG ABDOMEN ACUTE W/ 1V CHEST COMPARISON:  02/21/2019 FINDINGS: Shallow lung inflation with mild cardiomegaly and left basilar consolidation. Bowel gas pattern is nonobstructive. No free intraperitoneal air. IMPRESSION: Shallow lung inflation with left basilar consolidation, which may indicate atelectasis or pneumonia. Electronically Signed   By: Ulyses Jarred M.D.   On: 10/25/2019 00:21     Assessment and Recommendation  64 y.o. 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  No additional medication management due to significant hypotension and other side effects of medication and unfortunately cannot press further on medication management until further resolution of hypotension and acute kidney injury although currently has not had any significant symptoms of chest pain or shortness of breath.  Although would consider addition of low-dose beta-blocker when able for LV systolic dysfunction. 3.  Further episodes of EKG showing normal sinus rhythm with bundle branch block and short episodes of wide-complex tachycardia would best be served and treated with  low-dose beta-blocker when blood pressure will  be able to be added.  Will consider hopefully in a.m.  4.  Continue social work evaluation for possible placement due to recurrent episodes of hospital admissions with unfortunate social situation 5.  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 FACC

## 2019-11-02 NOTE — TOC Progression Note (Signed)
Transition of Care [facility]) - Progression Note    Patient Details  Name: Madalynn Pickelsimer MRN: [id number] Date of Birth: [birth year]  Transition of Care [facility]) CM/SW Contact  Meriel Flavors, [place] Phone Number: 11/02/2019, 2:21 PM  Clinical Narrative:    Due to patient having capacity, CSW spoke with [facility] as to patient's length of stay after discharge. Patient was given a bed at [facility] on September 02, 2019. Patient left facility [facility] September 08, 2019. Facility will not take patient back.        Expected Discharge Plan and Services                                                 Social Determinants of Health (SDOH) Interventions    Readmission Risk Interventions Readmission Risk Prevention Plan 09/02/2019 08/14/2019 07/27/2019  Transportation Screening Complete Complete Complete  Medication Review [employer]) Complete Referral to Pharmacy -  PCP or Specialist appointment within 3-5 days of discharge Complete Complete Complete  HRI or Home Care Consult - Complete Complete  SW Recovery Care/Counseling Consult - Complete Complete  Palliative Care Screening Not Applicable Not Applicable Not Applicable  Skilled Nursing Facility Complete Not Applicable Not Applicable  Some recent data might be hidden

## 2019-11-02 NOTE — Plan of Care (Signed)

## 2019-11-02 NOTE — Progress Notes (Signed)
PT Cancellation Note  Patient Details Name: Raechelle Sarti MRN: [id number] DOB: 06-20-1955   Cancelled Treatment:    Reason Eval/Treat Not Completed:  (Consult received and chart reviewed for continued efforts at PT evaluation.  Patient, again, adamantly refusing participation with session; generally agitated, offering multiple curse words and demanding therapist exit room when encouraged for participation. Unable to reason with and redirect.  Will continue efforts next date, provided patient willing to participate.)   Giovanie Lefebre H. Owens Shark, PT, DPT, NCS 11/02/19, 2:55 PM [phone]

## 2019-11-02 NOTE — TOC Progression Note (Signed)
Transition of Care [facility]) - Progression Note    Patient Details  Name: Katelyn Lane MRN: [id number] Date of Birth: 05-Sep-1955  [facility]) CM/SW Contact  Anselm Pancoast, RN Phone Number: 11/02/2019, 1:11 PM  Clinical Narrative:    Per Progress note 10/31/19 patient psych cleared and pending discharge. Requested PT consult from MD for possible SNF placement as patient was previously at [facility] and left after a short amount of time. Patient was agreeable to SNF placement this admission. Will follow for PT recommendations.         Expected Discharge Plan and Services                                                 Social Determinants of Health (SDOH) Interventions    Readmission Risk Interventions Readmission Risk Prevention Plan 09/02/2019 08/14/2019 07/27/2019  Transportation Screening Complete Complete Complete  Medication Review [employer] photographer) Complete Referral to Pharmacy -  PCP or Specialist appointment within 3-5 days of discharge Complete Complete Complete  HRI or Home Care Consult - Complete Complete  SW Recovery Care/Counseling Consult - Complete Complete  Palliative Care Screening Not Applicable Not Applicable Not Applicable  Skilled Nursing Facility Complete Not Applicable Not Applicable  Some recent data might be hidden

## 2019-11-02 NOTE — Progress Notes (Signed)
[facility]  ROUNDING NOTE   Subjective:   [name] admitted on 10/19/2019 for Peripheral edema [R60.9] Acute CHF (congestive heart failure) ([place]) [I50.9] AKI (acute kidney injury) ([place]) [N17.9] Congestive heart failure, unspecified HF chronicity, unspecified heart failure type ([place]) [I50.9]  Patient resting in bed, in no acute distress.She denies worsening SOB, nausea or vomiting. Urine output for the preceding 24 hours is 1200 ml  Objective:  Vital signs in last 24 hours:  Temp:  [97.5 F (36.4 C)-98.3 F (36.8 C)] 97.9 F (36.6 C) (10/25 1100) Pulse Rate:  [53-77] 64 (10/25 1100) Resp:  [16-18] 16 (10/25 1100) BP: (81-109)/(59-66) 94/62 (10/25 1100) SpO2:  [96 %-98 %] 96 % (10/25 1100) Weight:  [97.8 kg] 97.8 kg (10/25 0620)  Weight change: 2.041 kg Filed Weights   10/31/19 0254 11/01/19 0357 11/02/19 0620  Weight: 99 kg 95.8 kg 97.8 kg    Intake/Output: I/O last 3 completed shifts: In: 1111.6 [P.O.:960; I.V.:51.6; IV Piggyback:100] Out: 1200 [Urine:1200]   Intake/Output this shift:  Total I/O In: 720 [P.O.:720] Out: 1100 [Urine:1100]  Physical Exam: General:  Resting in bed,in no acute distress  Head  Normocephalic, atraumatic  Lungs:   Respiration symmetrical, non labored,Lungs clear ,O2 2L via nasal canula   Heart:  S1S2, Regular rate and rhythm  Abdomen:   Non tender, Lower Abdominal swelling +  Extremities: 2+ edema on bilateral lower extremities  Neurologic:  Alert,awake, speech clear  Skin: No acute lesions or rashes  Rt IJ catheter  Basic Metabolic Panel: Recent Labs  Lab 10/29/19 0413 10/29/19 0413 10/30/19 0550 10/30/19 0550 10/31/19 0446 11/01/19 0633 11/02/19 0924  NA 137  --  136  --  137 136 130*  K 4.1  --  4.0  --  3.6 3.4* 3.2*  CL 102  --  99  --  100 96* 92*  CO2 24  --  24  --  27 29 27   GLUCOSE 127*  --  194*  --  134* 109* 132*  BUN 73*  --  79*  --  77* 72* 71*  CREATININE 4.13*  --  4.16*  --  3.97*  3.50* 3.67*  CALCIUM 8.8*   < > 9.2   < > 9.3 9.2 8.3*  MG  --   --   --   --   --   --  1.7   < > = values in this interval not displayed.    Liver Function Tests: Recent Labs  Lab 10/27/19 0230  AST 14*  ALT 8  ALKPHOS 68  BILITOT 1.7*  PROT 5.0*  ALBUMIN 2.4*   No results for input(s): LIPASE, AMYLASE in the last 168 hours. No results for input(s): AMMONIA in the last 168 hours.  CBC: Recent Labs  Lab 10/27/19 0230 10/28/19 0503 10/29/19 0413 11/01/19 7846 11/02/19 0924  WBC 10.3 7.7 6.5 9.7 12.9*  NEUTROABS  --   --   --  7.8* 10.6*  HGB 10.3* 10.0* 9.8* 11.9* 10.6*  HCT 31.4* 30.4* 29.3* 36.1 31.0*  MCV 73.0* 71.0* 71.3* 71.1* 69.7*  PLT 245 222 229 295 296    Cardiac Enzymes: No results for input(s): CKTOTAL, CKMB, CKMBINDEX, TROPONINI in the last 168 hours.  BNP: Invalid input(s): POCBNP  CBG: No results for input(s): GLUCAP in the last 168 hours.  Microbiology: Results for orders placed or performed during the hospital encounter of 10/19/19  Respiratory Panel by RT PCR (Flu A&B, Covid) - Nasopharyngeal Swab  Status: None   Collection Time: 10/19/19 11:13 PM   Specimen: Nasopharyngeal Swab  Result Value Ref Range Status   SARS Coronavirus 2 by RT PCR NEGATIVE NEGATIVE Final    Comment: (NOTE) SARS-CoV-2 target nucleic acids are NOT DETECTED.  The SARS-CoV-2 RNA is generally detectable in upper respiratoy specimens during the acute phase of infection. The lowest concentration of SARS-CoV-2 viral copies this assay can detect is 131 copies/mL. A negative result does not preclude SARS-Cov-2 infection and should not be used as the sole basis for treatment or other patient management decisions. A negative result may occur with  improper specimen collection/handling, submission of specimen other than nasopharyngeal swab, presence of viral mutation(s) within the areas targeted by this assay, and inadequate number of viral copies (<131 copies/mL). A  negative result must be combined with clinical observations, patient history, and epidemiological information. The expected result is Negative.  Fact Sheet for Patients:  PinkCheek.be  Fact Sheet for Healthcare Providers:  GravelBags.it  This test is no t yet approved or cleared by the [place] FDA and  has been authorized for detection and/or diagnosis of SARS-CoV-2 by FDA under an Emergency Use Authorization (EUA). This EUA will remain  in effect (meaning this test can be used) for the duration of the COVID-19 declaration under Section 564(b)(1) of the Act, 21 U.S.C. section 360bbb-3(b)(1), unless the authorization is terminated or revoked sooner.     Influenza A by PCR NEGATIVE NEGATIVE Final   Influenza B by PCR NEGATIVE NEGATIVE Final    Comment: (NOTE) The Xpert Xpress SARS-CoV-2/FLU/RSV assay is intended as an aid in  the diagnosis of influenza from Nasopharyngeal swab specimens and  should not be used as a sole basis for treatment. Nasal washings and  aspirates are unacceptable for Xpert Xpress SARS-CoV-2/FLU/RSV  testing.  Fact Sheet for Patients: PinkCheek.be  Fact Sheet for Healthcare Providers: GravelBags.it  This test is not yet approved or cleared by the [place] FDA and  has been authorized for detection and/or diagnosis of SARS-CoV-2 by  FDA under an Emergency Use Authorization (EUA). This EUA will remain  in effect (meaning this test can be used) for the duration of the  Covid-19 declaration under Section 564(b)(1) of the Act, 21  U.S.C. section 360bbb-3(b)(1), unless the authorization is  terminated or revoked. Performed at [facility], [address] [place], [place] [address]   MRSA PCR Screening     Status: None   Collection Time: 10/25/19  2:50 PM   Specimen: Nasopharyngeal  Result Value Ref Range Status   MRSA by  PCR NEGATIVE NEGATIVE Final    Comment:        The GeneXpert MRSA Assay (FDA approved for NASAL specimens only), is one component of a comprehensive MRSA colonization surveillance program. It is not intended to diagnose MRSA infection nor to guide or monitor treatment for MRSA infections. Performed at [facility], [address] [place], [place] [address]   CULTURE, BLOOD (ROUTINE X 2) w Reflex to ID Panel     Status: None   Collection Time: 10/25/19  4:29 PM   Specimen: BLOOD  Result Value Ref Range Status   Specimen Description BLOOD CENTRAL LINE  Final   Special Requests   Final    BOTTLES DRAWN AEROBIC AND ANAEROBIC Blood Culture adequate volume   Culture   Final    NO GROWTH 5 DAYS Performed at [facility], [address], [place], [place] [address]  Report Status 10/30/2019 FINAL  Final  CULTURE, BLOOD (ROUTINE X 2) w Reflex to ID Panel     Status: None   Collection Time: 10/26/19  6:45 AM   Specimen: BLOOD  Result Value Ref Range Status   Specimen Description BLOOD BLOOD LEFT HAND  Final   Special Requests   Final    BOTTLES DRAWN AEROBIC AND ANAEROBIC Blood Culture adequate volume   Culture   Final    NO GROWTH 5 DAYS Performed at [facility], [address], [place], [place] [address]    Report Status 10/31/2019 FINAL  Final  Body fluid culture     Status: None   Collection Time: 10/26/19 11:30 AM   Specimen: Fluid  Result Value Ref Range Status   Specimen Description   Final    FLUID Performed at [facility], [address] [place], [place] [address]    Special Requests   Final    PERITONEAL Performed at [facility], [address] [place], [place] [address]    Gram Stain   Final    ABUNDANT WBC PRESENT, PREDOMINANTLY PMN NO ORGANISMS SEEN    Culture   Final    NO GROWTH Performed at [facility], [place] [address], [place], [place] [address]    Report Status 10/30/2019 FINAL  Final   Body fluid culture     Status: None   Collection Time: 10/29/19  1:39 PM   Specimen: PATH Cytology Peritoneal fluid  Result Value Ref Range Status   Specimen Description   Final    PERITONEAL Performed at [facility], [address], [place], [place] [address]    Special Requests   Final    NONE Performed at [facility], [address] [place], [place] [address]    Gram Stain   Final    MODERATE WBC PRESENT, PREDOMINANTLY PMN NO ORGANISMS SEEN    Culture   Final    NO GROWTH 3 DAYS Performed at [facility], [place] [address], [place], [place] [address]    Report Status 11/02/2019 FINAL  Final    Coagulation Studies: No results for input(s): LABPROT, INR in the last 72 hours.  Urinalysis: No results for input(s): COLORURINE, LABSPEC, PHURINE, GLUCOSEU, HGBUR, BILIRUBINUR, KETONESUR, PROTEINUR, UROBILINOGEN, NITRITE, LEUKOCYTESUR in the last 72 hours.  Invalid input(s): APPERANCEUR    Imaging: No results found.   Medications:    sodium chloride Stopped (11/01/19 1131)   albumin human     cefTRIAXone (ROCEPHIN)  IV 2 g (11/02/19 0831)    Chlorhexidine Gluconate Cloth  6 each Topical Daily   furosemide  60 mg Intravenous BID   mouth rinse  15 mL Mouth Rinse BID   melatonin  2.5 mg Oral QHS   pantoprazole  40 mg Oral BID   sodium chloride flush  3 mL Intravenous Q12H   sodium chloride, acetaminophen, albuterol, alum & mag hydroxide-simeth **AND** lidocaine, bisacodyl, calcium carbonate, Glycerin (Adult), loperamide, midodrine, ondansetron (ZOFRAN) IV, ondansetron (ZOFRAN) IV, oxyCODONE-acetaminophen, sodium chloride flush  Assessment/ Plan:  [name] is a 64 y.o. black female with congestive heart failure, hypertension, hyperlipidemia, gout, COPD, anemia, cocaine abuse who is admitted to [facility] on 10/19/2019 for Peripheral edema [R60.9] Acute CHF (congestive heart failure) ([place]) [I50.9] AKI (acute kidney injury) ([place])  [N17.9] Congestive heart failure, unspecified HF chronicity, unspecified heart failure type ([place]) [I50.9]  1. Acute kidney injury on chronic kidney disease stage IIIB. Baseline creatinine of  1.66, GFR of 32. With history of proteinuria.  Chronic kidney disease secondary to hypertension  Acute renal failure secondary to acute cardiorenal syndrome  -Patient received  Dialysis with UF only session on 10/26/19 , with 1.5L fluid removal. Lab Results  Component Value Date   CREATININE 3.67 (H) 11/02/2019   CREATININE 3.50 (H) 11/01/2019   CREATININE 3.97 (H) 10/31/2019  Urine Output for the preceding 24 hours is 1200 ml -Will continue monitoring renal function closely -No plan for dialysis today -We will keep Rt.IJ access as patient may require dialysis if renal function keeps worsening   2. Hypotension with acute exacerbation of systolic and diastolic congestive heart failure. Echocardiogram 06/22/19 EF of 25-30%.  -on 2 L nasal cannula,,patient keeps it as needed -On Furosemide IV BID -Blood pressure readings within low normal range -Patient is on Midodrine  3. Ascites/ Bacterial Peritonitis Patient is on IV antibiotics,Rocephin  Gastroenterology team managing       LOS: 14 Nyashia Raney 10/25/20212:22 PM

## 2019-11-02 NOTE — Progress Notes (Signed)
PROGRESS NOTE    Katelyn Lane  YWV:[id number] DOB: 1955-05-30 DOA: 10/19/2019 PCP: [name], MD   Chief complaint.  Abdominal pain. 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 Found to have BNP greater than 4500, elevated troponin and AKI.  Multiple prior admissions with similar symptoms and complaints. Prolonged hospitalization in August 2021 when she was discharged to SNF. Not clear when she get out of SNF. Continues to complain that she is homeless but per social worker she do have a house. Not take her medication despite being provided with medical management.  After admission to the hospital, she was diagnosed with acute on chronic combinedsystolicanddiastoliccongestiveheartfailurewith ejection fraction 25 to 30%. She was not responding to IV Lasix. She was seen by nephrology, temporary dialysis catheter was performed and she was dialyzed on 10/17 and 10/18. Patient also complaining of abdominal pain, she had a paracentesis, study showed possible spontaneous peritonitis, she was treated with Rocephin and Flagyl.  Patient had a worsening respiratory status on 10/17, was diagnosed with aspiration pneumonia. She was treated with Rocephin, Zithromax and Flagyl.  10/21.Repeat paracentesis performed, antibiotic changed to Rocephin only. Permacath placed.  10/22.Repeated paracentesis showed white cell count of 3048, 69% neutrophils. However, patient clinically improving. Continue Rocephin   Assessment & Plan:   Principal Problem:   Dementia ([place]) Active Problems:   AKI (acute kidney injury) ([place])   Crack cocaine use   Acute respiratory failure with hypoxia ([facility])    LV (left ventricular) mural thrombus   Acute CHF (congestive heart failure) ([facility])   Cocaine abuse ([place])   Peripheral edema   Cirrhosis of liver ([place])   Spontaneous bacterial peritonitis ([place])  #1.  Spontaneous bacterial peritonitis. Patient still has some abdominal pain.  Continue Rocephin. I will obtain repeated paracentesis again, if white cell count still elevated in ascites, I will consider switch antibiotics.  Culture has been negative from it.  2.  Acute on chronic combined systolic and diastolic congestive heart failure. Acute hypoxemic respite failure. Condition much improved.  Patient appears to be euvolemic.  3.  Liver cirrhosis with ascites Obtain repeated paracentesis.  4.  Acute renal failure on chronic kidney disease stage IIIb. Renal function appears to be stabilized.  Followed by nephrology.  5.  Left ventricular thrombus with history of CVA. I will hold anticoagulation again due to scheduled paracentesis.    DVT prophylaxis: SCDs Code Status: Full Family Communication: None Disposition Plan:  .   Status is: Inpatient  Remains inpatient appropriate because:Inpatient level of care appropriate due to severity of illness   Dispo: The patient is from: Home              Anticipated d/c is to: SNF              Anticipated d/c date is: 2 days              Patient currently is not medically stable to d/c.        I/O last 3 completed shifts: In: 1111.6 [P.O.:960; I.V.:51.6; IV Piggyback:100] Out: 1200 [Urine:1200] Total I/O In: 94 [P.O.:720] Out: 1100 [Urine:1100]     Consultants:   GI  Procedures: paracentesis  Antimicrobials: Rocephin Subjective: Patient doing well today.  Still complaint remittent abdominal pain, mild, no nausea vomiting.  Patient no longer has any diarrhea. Not short of breath or cough. No fever or chills.  Objective: Vitals:   11/02/19 0506 11/02/19 0620 11/02/19 0755 11/02/19 1100  BP: (!) 81/59  91/60 94/62   Pulse: (!) 53  61 64  Resp: 16  17 16   Temp: (!) 97.5 F (36.4 C)  97.8 F (36.6 C) 97.9 F (36.6 C)  TempSrc: Oral  Oral Oral  SpO2: 96%  97% 96%  Weight:  97.8 kg    Height:        Intake/Output Summary (Last 24 hours) at 11/02/2019 1413 Last data filed at 11/02/2019 1249 Gross per 24 hour  Intake 1351.61 ml  Output 1800 ml  Net -448.39 ml   Filed Weights   10/31/19 0254 11/01/19 0357 11/02/19 0620  Weight: 99 kg 95.8 kg 97.8 kg    Examination:  General exam: Appears calm and comfortable  Respiratory system: Clear to auscultation. Respiratory effort normal. Cardiovascular system: S1 & S2 heard, RRR. No JVD, murmurs, rubs, gallops or clicks. No pedal edema. Gastrointestinal system: Abdomen is nondistended, soft and still tender with mild rebound tenderness. No organomegaly or masses felt. Normal bowel sounds heard. Central nervous system: Alert and oriented. No focal neurological deficits. Extremities: Symmetric  Skin: No rashes, lesions or ulcers Psychiatry:  Mood & affect appropriate.     Data Reviewed: I have personally reviewed following labs and imaging studies  CBC: Recent Labs  Lab 10/27/19 0230 10/28/19 0503 10/29/19 0413 11/01/19 0633 11/02/19 0924  WBC 10.3 7.7 6.5 9.7 12.9*  NEUTROABS  --   --   --  7.8* 10.6*  HGB 10.3* 10.0* 9.8* 11.9* 10.6*  HCT 31.4* 30.4* 29.3* 36.1 31.0*  MCV 73.0* 71.0* 71.3* 71.1* 69.7*  PLT 245 222 229 295 962   Basic Metabolic Panel: Recent Labs  Lab 10/29/19 0413 10/30/19 0550 10/31/19 0446 11/01/19 0633 11/02/19 0924  NA 137 136 137 136 130*  K 4.1 4.0 3.6 3.4* 3.2*  CL 102 99 100 96* 92*  CO2 24 24 27 29 27   GLUCOSE 127* 194* 134* 109* 132*  BUN 73* 79* 77* 72* 71*  CREATININE 4.13* 4.16* 3.97* 3.50* 3.67*  CALCIUM 8.8* 9.2 9.3 9.2 8.3*  MG  --   --   --   --  1.7   GFR: Estimated Creatinine Clearance: 18.5 mL/min (A) (by C-G formula based on SCr of 3.67 mg/dL (H)). Liver Function Tests: Recent Labs   Lab 10/27/19 0230  AST 14*  ALT 8  ALKPHOS 68  BILITOT 1.7*  PROT 5.0*  ALBUMIN 2.4*   No results for input(s): LIPASE, AMYLASE in the last 168 hours. No results for input(s): AMMONIA in the last 168 hours. Coagulation Profile: Recent Labs  Lab 10/27/19 0230  INR 2.0*   Cardiac Enzymes: No results for input(s): CKTOTAL, CKMB, CKMBINDEX, TROPONINI in the last 168 hours. BNP (last 3 results) No results for input(s): PROBNP in the last 8760 hours. HbA1C: No results for input(s): HGBA1C in the last 72 hours. CBG: No results for input(s): GLUCAP in the last 168 hours. Lipid Profile: No results for input(s): CHOL, HDL, LDLCALC, TRIG, CHOLHDL, LDLDIRECT in the last 72 hours. Thyroid Function Tests: No results for input(s): TSH, T4TOTAL, FREET4, T3FREE, THYROIDAB in the last 72 hours. Anemia Panel: No results for input(s): VITAMINB12, FOLATE, FERRITIN, TIBC, IRON, RETICCTPCT in the last 72 hours. Sepsis Labs: Recent Labs  Lab 10/27/19 0320  LATICACIDVEN 1.0    Recent Results (from the  past 240 hour(s))  MRSA PCR Screening     Status: None   Collection Time: 10/25/19  2:50 PM   Specimen: Nasopharyngeal  Result Value Ref Range Status   MRSA by PCR NEGATIVE NEGATIVE Final    Comment:        The GeneXpert MRSA Assay (FDA approved for NASAL specimens only), is one component of a comprehensive MRSA colonization surveillance program. It is not intended to diagnose MRSA infection nor to guide or monitor treatment for MRSA infections. Performed at [facility], [address] [place], [place] [address]   CULTURE, BLOOD (ROUTINE X 2) w Reflex to ID Panel     Status: None   Collection Time: 10/25/19  4:29 PM   Specimen: BLOOD  Result Value Ref Range Status   Specimen Description BLOOD CENTRAL LINE  Final   Special Requests   Final    BOTTLES DRAWN AEROBIC AND ANAEROBIC Blood Culture adequate volume   Culture   Final    NO GROWTH 5 DAYS Performed at [facility], [address] [place], [place] [address]    Report Status 10/30/2019 FINAL  Final  CULTURE, BLOOD (ROUTINE X 2) w Reflex to ID Panel     Status: None   Collection Time: 10/26/19  6:45 AM   Specimen: BLOOD  Result Value Ref Range Status   Specimen Description BLOOD BLOOD LEFT HAND  Final   Special Requests   Final    BOTTLES DRAWN AEROBIC AND ANAEROBIC Blood Culture adequate volume   Culture   Final    NO GROWTH 5 DAYS Performed at [facility], [address], [place], [place] [address]    Report Status 10/31/2019 FINAL  Final  Body fluid culture     Status: None   Collection Time: 10/26/19 11:30 AM   Specimen: Fluid  Result Value Ref Range Status   Specimen Description   Final    FLUID Performed at [facility], [address] [place], [place] [address]    Special Requests   Final    PERITONEAL Performed at [facility], [address] [place], [place] [address]    Gram Stain   Final    ABUNDANT WBC PRESENT, PREDOMINANTLY PMN NO ORGANISMS SEEN    Culture   Final    NO GROWTH Performed at [facility], [place] [address], [place], [place] [address]    Report Status 10/30/2019 FINAL  Final  Body fluid culture     Status: None   Collection Time: 10/29/19  1:39 PM   Specimen: PATH Cytology Peritoneal fluid  Result Value Ref Range Status   Specimen Description   Final    PERITONEAL Performed at [facility], [address], [place], [place] [address]    Special Requests   Final    NONE Performed at [facility], [address] [place], [place] [address]    Gram Stain   Final    MODERATE WBC PRESENT, PREDOMINANTLY PMN NO ORGANISMS SEEN    Culture   Final    NO GROWTH 3 DAYS Performed at [facility], [place] [address], [place],  [address]    Report Status 11/02/2019 FINAL  Final         Radiology Studies: No results found.      Scheduled Meds: . apixaban  5 mg Oral BID   . Chlorhexidine Gluconate Cloth  6 each Topical Daily  . furosemide  60 mg  Intravenous BID  . mouth rinse  15 mL Mouth Rinse BID  . melatonin  2.5 mg Oral QHS  . pantoprazole  40 mg Oral BID  . sodium chloride flush  3 mL Intravenous Q12H   Continuous Infusions: . sodium chloride Stopped (11/01/19 1131)  . cefTRIAXone (ROCEPHIN)  IV 2 g (11/02/19 0831)     LOS: 14 days    Time spent: 27 minutes    Sharen Hones, MD [facility]   To contact the attending provider between 7A-7P or the covering provider during after hours 7P-7A, please log into the web site [email or web address] and access using universal [place] password for that web site. If you do not have the password, please call the hospital operator.  11/02/2019, 2:13 PM

## 2019-11-02 NOTE — Progress Notes (Signed)
   11/02/19 1300  Clinical Encounter Type  Visited With Patient  Visit Type Follow-up  Referral From Chaplain  Consult/Referral To Chaplain  Chaplain stopped by to see Pt, but she was sleep. Pt will follow up later.

## 2019-11-02 NOTE — NC FL2 (Signed)
[place] LEVEL OF CARE SCREENING TOOL     IDENTIFICATION  Patient Name: Katelyn Lane Birthdate: [birth year] Sex: female Admission Date (Current Location): 10/19/2019  [place] and [place] Number:  Engineering geologist and Address:  [facility], [address], [place], [place] [address]      Provider Number:    Attending Physician Name and Address:  Sharen Hones, MD  Relative Name and Phone Number:       Current Level of Care: Hospital Recommended Level of Care: [place] Prior Approval Number:    Date Approved/Denied:   PASRR Number: [id number]  Discharge Plan: SNF    Current Diagnoses: Patient Active Problem List   Diagnosis Date Noted  . Aspiration pneumonia of both lower lobes due to gastric secretions ([place]) 11/02/2019  . Spontaneous bacterial peritonitis ([place]) 10/31/2019  . Dementia ([place]) 10/29/2019  . Cirrhosis of liver ([place]) 10/24/2019  . Cocaine abuse ([place]) 10/20/2019  . Peripheral edema   . Acute CHF (congestive heart failure) ([place]) 07/27/2019  . Prolonged QT interval 06/24/2019  . History of anemia due to chronic kidney disease 06/19/2019  . CHF, acute on chronic ([place]) 06/03/2019  . Acute on chronic combined systolic (congestive) and diastolic (congestive) heart failure ([place]) 06/03/2019  . Accelerated hypertension   . Nonsustained ventricular tachycardia ([place])   . Chronic obstructive pulmonary disease ([place])   . Pulmonary hypertension ([place])   . Acute metabolic encephalopathy 33/29/5188  . History of substance abuse ([place]) 05/21/2019  . CHF (congestive heart failure) ([place]) 05/21/2019  . Acute systolic CHF (congestive heart failure) ([place]) 04/08/2019  . Acute on chronic combined systolic and diastolic CHF (congestive heart failure) ([place]) 03/24/2019  . COVID-19 03/06/2019  . Acute respiratory failure with hypoxia ([place]) 03/06/2019  . Hypokalemia 03/06/2019  . LV (left ventricular) mural thrombus  03/06/2019  . CKD (chronic kidney disease), stage III ([place]) 03/06/2019  . Nausea and vomiting 02/16/2019  . Abdominal pain 02/16/2019  . Rhabdomyolysis 02/16/2019  . AKI (acute kidney injury) ([place]) 02/16/2019  . Crack cocaine use 02/16/2019  . Tobacco abuse 02/16/2019  . Acute on chronic systolic CHF (congestive heart failure) ([place]) 02/16/2019  . Acute congestive heart failure ([place]) 02/13/2019  . Respiratory failure with hypoxia ([place]) 01/31/2019  . Stroke ([place]) 01/18/2019  . Dilated cardiomyopathy ([place]) 12/31/2018  . Chest tightness 12/31/2018  . Elevated troponin 12/31/2018  . Hypertensive urgency 12/31/2018  . Hyperlipidemia 12/31/2018  . Depression 12/31/2018  . HFrEF (heart failure with reduced ejection fraction) ([place]) 12/29/2018    Orientation RESPIRATION BLADDER Height & Weight     Self, Time, Situation, Place    Continent Weight: 215 lb 9.6 oz (97.8 kg) Height:  5\' 6"  (167.6 cm)  BEHAVIORAL SYMPTOMS/MOOD NEUROLOGICAL BOWEL NUTRITION STATUS      Continent    AMBULATORY STATUS COMMUNICATION OF NEEDS Skin   Limited Assist Verbally                         Personal Care Assistance Level of Assistance  Bathing, Feeding, Dressing, Total care Bathing Assistance: Independent Feeding assistance: Independent Dressing Assistance: Independent Total Care Assistance: Limited assistance   Functional Limitations Info             SPECIAL CARE FACTORS FREQUENCY  PT (By licensed PT), OT (By licensed OT)     PT Frequency: 5 x weekly OT Frequency: 5 x weekly  Contractures      Additional Factors Info  Code Status Code Status Info: Full             Current Medications (11/02/2019):  This is the current hospital active medication list Current Facility-Administered Medications  Medication Dose Route Frequency Provider Last Rate Last Admin  . 0.9 %  sodium chloride infusion  250 mL Intravenous PRN Schnier, Dolores Lory, MD   Stopped at 11/01/19 1131  .  acetaminophen (TYLENOL) tablet 650 mg  650 mg Oral Q4H PRN Schnier, Dolores Lory, MD   650 mg at 11/02/19 1136  . albumin human 25 % solution 50 g  50 g Intravenous Once Sharen Hones, MD      . albuterol (PROVENTIL) (2.5 MG/3ML) 0.083% nebulizer solution 2.5 mg  2.5 mg Nebulization Q4H PRN Sharen Hones, MD      . alum & mag hydroxide-simeth (MAALOX/MYLANTA) 200-200-20 MG/5ML suspension 30 mL  30 mL Oral Q4H PRN Schnier, Dolores Lory, MD   30 mL at 10/23/19 2205   And  . lidocaine (XYLOCAINE) 2 % viscous mouth solution 15 mL  15 mL Oral Q4H PRN Delana Meyer Dolores Lory, MD   15 mL at 10/23/19 2205  . bisacodyl (DULCOLAX) suppository 10 mg  10 mg Rectal Daily PRN Schnier, Dolores Lory, MD   10 mg at 10/24/19 0459  . calcium carbonate (TUMS - dosed in mg elemental calcium) chewable tablet 200 mg of elemental calcium  1 tablet Oral TID PRN Sharen Hones, MD   200 mg of elemental calcium at 11/01/19 0949  . cefTRIAXone (ROCEPHIN) 2 g in sodium chloride 0.9 % 100 mL IVPB  2 g Intravenous Daily Lin Landsman, MD 200 mL/hr at 11/02/19 0831 2 g at 11/02/19 0831  . Chlorhexidine Gluconate Cloth 2 % PADS 6 each  6 each Topical Daily Schnier, Dolores Lory, MD   6 each at 11/02/19 [phone]  . furosemide (LASIX) injection 60 mg  60 mg Intravenous BID Katha Cabal, MD   60 mg at 11/02/19 7858  . Glycerin (Adult) 2.1 g suppository 1 suppository  1 suppository Rectal Daily PRN Schnier, Dolores Lory, MD      . loperamide (IMODIUM) capsule 2 mg  2 mg Oral PRN Sharen Hones, MD   2 mg at 11/01/19 0950  . MEDLINE mouth rinse  15 mL Mouth Rinse BID Schnier, Dolores Lory, MD   15 mL at 11/02/19 0823  . melatonin tablet 2.5 mg  2.5 mg Oral QHS Sharen Hones, MD   2.5 mg at 11/01/19 2145  . midodrine (PROAMATINE) tablet 2.5 mg  2.5 mg Oral TID WC PRN Schnier, Dolores Lory, MD   2.5 mg at 10/26/19 1038  . ondansetron (ZOFRAN) injection 4 mg  4 mg Intravenous Q8H PRN Schnier, Dolores Lory, MD   4 mg at 10/24/19 1901  . ondansetron (ZOFRAN) injection  4 mg  4 mg Intravenous Q6H PRN Schnier, Dolores Lory, MD      . oxyCODONE-acetaminophen (PERCOCET/ROXICET) 5-325 MG per tablet 1 tablet  1 tablet Oral Q4H PRN Sharen Hones, MD   1 tablet at 11/01/19 2145  . pantoprazole (PROTONIX) EC tablet 40 mg  40 mg Oral BID Sharen Hones, MD   40 mg at 11/02/19 0824  . sodium chloride flush (NS) 0.9 % injection 3 mL  3 mL Intravenous Q12H Schnier, Dolores Lory, MD   3 mL at 11/02/19 0823  . sodium chloride flush (NS) 0.9 % injection 3 mL  3 mL Intravenous PRN  Schnier, Dolores Lory, MD   3 mL at 10/28/19 1120     Discharge Medications: Please see discharge summary for a list of discharge medications.  Relevant Imaging Results:  Relevant Lab Results:   Additional Information SS# [id number]  Meriel Flavors, LCSW

## 2019-11-03 DIAGNOSIS — N179 Acute kidney failure, unspecified: Secondary | ICD-10-CM | POA: Diagnosis not present

## 2019-11-03 DIAGNOSIS — I5041 Acute combined systolic (congestive) and diastolic (congestive) heart failure: Secondary | ICD-10-CM | POA: Diagnosis not present

## 2019-11-03 DIAGNOSIS — I5031 Acute diastolic (congestive) heart failure: Secondary | ICD-10-CM | POA: Diagnosis present

## 2019-11-03 DIAGNOSIS — K652 Spontaneous bacterial peritonitis: Secondary | ICD-10-CM | POA: Diagnosis not present

## 2019-11-03 DIAGNOSIS — J9601 Acute respiratory failure with hypoxia: Secondary | ICD-10-CM | POA: Diagnosis not present

## 2019-11-03 LAB — CBC WITH DIFFERENTIAL/PLATELET
Abs Immature Granulocytes: 0.07 10*3/uL (ref 0.00–0.07)
Basophils Absolute: 0 10*3/uL (ref 0.0–0.1)
Basophils Relative: 0 %
Eosinophils Absolute: 0.2 10*3/uL (ref 0.0–0.5)
Eosinophils Relative: 1 %
HCT: 26.7 % — ABNORMAL LOW (ref 36.0–46.0)
Hemoglobin: 8.9 g/dL — ABNORMAL LOW (ref 12.0–15.0)
Immature Granulocytes: 1 %
Lymphocytes Relative: 7 %
Lymphs Abs: 0.8 10*3/uL (ref 0.7–4.0)
MCH: 23.5 pg — ABNORMAL LOW (ref 26.0–34.0)
MCHC: 33.3 g/dL (ref 30.0–36.0)
MCV: 70.6 fL — ABNORMAL LOW (ref 80.0–100.0)
Monocytes Absolute: 1.4 10*3/uL — ABNORMAL HIGH (ref 0.1–1.0)
Monocytes Relative: 12 %
Neutro Abs: 9 10*3/uL — ABNORMAL HIGH (ref 1.7–7.7)
Neutrophils Relative %: 79 %
Platelets: 313 10*3/uL (ref 150–400)
RBC: 3.78 MIL/uL — ABNORMAL LOW (ref 3.87–5.11)
RDW: 21 % — ABNORMAL HIGH (ref 11.5–15.5)
WBC: 11.4 10*3/uL — ABNORMAL HIGH (ref 4.0–10.5)
nRBC: 0 % (ref 0.0–0.2)

## 2019-11-03 LAB — PATHOLOGIST SMEAR REVIEW

## 2019-11-03 MED ORDER — NEPRO/CARBSTEADY PO LIQD
237.0000 mL | ORAL | Status: DC
  Administered 2019-11-04 – 2019-11-13 (×3): 237 mL via ORAL

## 2019-11-03 MED ORDER — POLYSACCHARIDE IRON COMPLEX 150 MG PO CAPS
150.0000 mg | ORAL_CAPSULE | Freq: Every day | ORAL | Status: DC
  Administered 2019-11-03 – 2020-01-28 (×72): 150 mg via ORAL
  Filled 2019-11-03 (×89): qty 1

## 2019-11-03 MED ORDER — APIXABAN 5 MG PO TABS
5.0000 mg | ORAL_TABLET | Freq: Two times a day (BID) | ORAL | Status: DC
  Administered 2019-11-03 – 2020-01-10 (×133): 5 mg via ORAL
  Filled 2019-11-03 (×135): qty 1

## 2019-11-03 NOTE — Progress Notes (Signed)
Initial Nutrition Assessment  DOCUMENTATION CODES:   Obesity unspecified  INTERVENTION:   Nepro Shake po daily, each supplement provides 425 kcal and 19 grams protein  Liberalize diet   NUTRITION DIAGNOSIS:   Increased nutrient needs related to chronic illness (cirrhosis, CHF) as evidenced by increased estimated needs.  GOAL:   Patient will meet greater than or equal to 90% of their needs  MONITOR:   PO intake, Supplement acceptance, Labs, Weight trends, Skin, I & O's  REASON FOR ASSESSMENT:   LOS    ASSESSMENT:   64 y.o. 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  Pt s/p HD from 10/17-10/18 Pt s/p multiple paracentesis'   Met with pt in room today. Pt is a poor historian and unable to provide much reliable nutrition history. Pt's main complaint today is that she hates the hospital food and that she is tired of getting the same food items over and over. Pt reports that she eats well at home when she can get foods she likes. Pt is documented to be eating 50-100% of meals in hospital. RD will liberalize pt's diet to a 2g sodium restriction and add Nepro supplements (prefers butter pecan). Per chart, pt is down ~55lbs since admit but appears to be back at her UBW. Pt is weight stable at baseline.   Medications reviewed and include: lasix, iron polysaccharides, melatonin, protonix, ceftriaxone   Labs reviewed: Na 130(L), K 3.2(L), BUN 71(H), creat 3.67(H), Mg 1.7 wnl Wbc- 11.4(H), Hgb 8.9(L), Hct 26.7(L), MCV 70.6(L), MCH 23.5(L)  NUTRITION - FOCUSED PHYSICAL EXAM:    Most Recent Value  Orbital Region No depletion  Upper Arm Region No depletion  Thoracic and Lumbar Region No depletion  Buccal Region No depletion  Temple Region  Moderate depletion  Clavicle Bone Region Moderate depletion  Clavicle and Acromion Bone Region Moderate depletion  Scapular Bone Region Unable to assess  Dorsal Hand Moderate depletion  Patellar Region Mild depletion  Anterior Thigh Region Mild depletion  Posterior Calf Region Mild depletion  Edema (RD Assessment) Mild  Hair Reviewed  Eyes Reviewed  Mouth Reviewed  Skin Reviewed  Nails Reviewed     Diet Order:   Diet Order            Diet renal with fluid restriction Fluid restriction: 1200 mL Fluid; Room service appropriate? Yes; Fluid consistency: Thin  Diet effective now                EDUCATION NEEDS:   Education needs have been addressed  Skin:  Skin Assessment: Reviewed RN Assessment (non-pressure injury L leg)  Last BM:  10/26- type 6  Height:   Ht Readings from Last 1 Encounters:  10/26/19 $RemoveB'5\' 6"'wZlhDdcL$  (1.676 m)    Weight:   Wt Readings from Last 1 Encounters:  11/03/19 95.4 kg    Ideal Body Weight:  59 kg  BMI:  Body mass index is 33.96 kg/m.  Estimated Nutritional Needs:   Kcal:  1900-2200kcal/day  Protein:  95-110g/day  Fluid:  1.8-2L/day  Koleen Distance MS, RD, LDN Please refer to [facility] for RD and/or RD on-call/weekend/after hours pager

## 2019-11-03 NOTE — Progress Notes (Signed)
Mobility Specialist - Progress Note   11/03/19 1500  Mobility  Activity Refused mobility  Mobility performed by Mobility specialist    Pt immediately declined session as mobility tech entered room, no reason specified. Pt voiced that she'll be agreeable to session tomorrow. Will attempt session at another date.    Kathee Delton Mobility Specialist 11/03/19, 3:38 PM

## 2019-11-03 NOTE — Progress Notes (Signed)
[facility]  ROUNDING NOTE   Subjective:   Katelyn Lane admitted on 10/19/2019 for Peripheral edema [R60.9] Acute CHF (congestive heart failure) ([place]) [I50.9] AKI (acute kidney injury) ([place]) [N17.9] Congestive heart failure, unspecified HF chronicity, unspecified heart failure type ([place]) [I50.9]  Patient sitting up at the side of the bed, consuming breakfast.She denies nausea,vomiting and abdominal discomfort. She is currently on room air, has her nasal cannula by her side in bed, denies SOB.  Objective:  Vital signs in last 24 hours:  Temp:  [97.9 F (36.6 C)-99 F (37.2 C)] 97.9 F (36.6 C) (10/26 1110) Pulse Rate:  [92-109] 95 (10/26 1110) Resp:  [16-20] 17 (10/26 1110) BP: (82-110)/(56-74) 105/66 (10/26 1110) SpO2:  [93 %-100 %] 99 % (10/26 1110) Weight:  [95.4 kg] 95.4 kg (10/26 0410)  Weight change: -2.359 kg Filed Weights   11/01/19 0357 11/02/19 0620 11/03/19 0410  Weight: 95.8 kg 97.8 kg 95.4 kg    Intake/Output: I/O last 3 completed shifts: In: 720 [P.O.:720] Out: 1550 [Urine:1550]   Intake/Output this shift:  Total I/O In: 120 [P.O.:120] Out: -   Physical Exam: General:  In no acute distress  Head  Normocephalic, atraumatic  Lungs:   Lungs diminished at the bases,Respirations even,unlabored  Heart:  Regular rate and rhythm  Abdomen:    Lower Abdominal swelling +, non tender  Extremities: 2+ edema on bilateral lower extremities  Neurologic:  Oriented,speech clear and appropriate  Skin: No acute lesions or rashes  Rt IJ catheter  Basic Metabolic Panel: Recent Labs  Lab 10/29/19 0413 10/29/19 0413 10/30/19 0550 10/30/19 0550 10/31/19 0446 11/01/19 0633 11/02/19 0924  NA 137  --  136  --  137 136 130*  K 4.1  --  4.0  --  3.6 3.4* 3.2*  CL 102  --  99  --  100 96* 92*  CO2 24  --  24  --  27 29 27   GLUCOSE 127*  --  194*  --  134* 109* 132*  BUN 73*  --  79*  --  77* 72* 71*  CREATININE 4.13*  --  4.16*  --  3.97* 3.50*  3.67*  CALCIUM 8.8*   < > 9.2   < > 9.3 9.2 8.3*  MG  --   --   --   --   --   --  1.7   < > = values in this interval not displayed.    Liver Function Tests: No results for input(s): AST, ALT, ALKPHOS, BILITOT, PROT, ALBUMIN in the last 168 hours. No results for input(s): LIPASE, AMYLASE in the last 168 hours. No results for input(s): AMMONIA in the last 168 hours.  CBC: Recent Labs  Lab 10/28/19 0503 10/29/19 0413 11/01/19 3546 11/02/19 0924 11/03/19 0628  WBC 7.7 6.5 9.7 12.9* 11.4*  NEUTROABS  --   --  7.8* 10.6* 9.0*  HGB 10.0* 9.8* 11.9* 10.6* 8.9*  HCT 30.4* 29.3* 36.1 31.0* 26.7*  MCV 71.0* 71.3* 71.1* 69.7* 70.6*  PLT 222 229 295 296 313    Cardiac Enzymes: No results for input(s): CKTOTAL, CKMB, CKMBINDEX, TROPONINI in the last 168 hours.  BNP: Invalid input(s): POCBNP  CBG: No results for input(s): GLUCAP in the last 168 hours.  Microbiology: Results for orders placed or performed during the hospital encounter of 10/19/19  Respiratory Panel by RT PCR (Flu A&B, Covid) - Nasopharyngeal Swab     Status: None   Collection Time: 10/19/19 11:13 PM  Specimen: Nasopharyngeal Swab  Result Value Ref Range Status   SARS Coronavirus 2 by RT PCR NEGATIVE NEGATIVE Final    Comment: (NOTE) SARS-CoV-2 target nucleic acids are NOT DETECTED.  The SARS-CoV-2 RNA is generally detectable in upper respiratoy specimens during the acute phase of infection. The lowest concentration of SARS-CoV-2 viral copies this assay can detect is 131 copies/mL. A negative result does not preclude SARS-Cov-2 infection and should not be used as the sole basis for treatment or other patient management decisions. A negative result may occur with  improper specimen collection/handling, submission of specimen other than nasopharyngeal swab, presence of viral mutation(s) within the areas targeted by this assay, and inadequate number of viral copies (<131 copies/mL). A negative result must be  combined with clinical observations, patient history, and epidemiological information. The expected result is Negative.  Fact Sheet for Patients:  PinkCheek.be  Fact Sheet for Healthcare Providers:  GravelBags.it  This test is no t yet approved or cleared by the [place] FDA and  has been authorized for detection and/or diagnosis of SARS-CoV-2 by FDA under an Emergency Use Authorization (EUA). This EUA will remain  in effect (meaning this test can be used) for the duration of the COVID-19 declaration under Section 564(b)(1) of the Act, 21 U.S.C. section 360bbb-3(b)(1), unless the authorization is terminated or revoked sooner.     Influenza A by PCR NEGATIVE NEGATIVE Final   Influenza B by PCR NEGATIVE NEGATIVE Final    Comment: (NOTE) The Xpert Xpress SARS-CoV-2/FLU/RSV assay is intended as an aid in  the diagnosis of influenza from Nasopharyngeal swab specimens and  should not be used as a sole basis for treatment. Nasal washings and  aspirates are unacceptable for Xpert Xpress SARS-CoV-2/FLU/RSV  testing.  Fact Sheet for Patients: PinkCheek.be  Fact Sheet for Healthcare Providers: GravelBags.it  This test is not yet approved or cleared by the [place] FDA and  has been authorized for detection and/or diagnosis of SARS-CoV-2 by  FDA under an Emergency Use Authorization (EUA). This EUA will remain  in effect (meaning this test can be used) for the duration of the  Covid-19 declaration under Section 564(b)(1) of the Act, 21  U.S.C. section 360bbb-3(b)(1), unless the authorization is  terminated or revoked. Performed at [facility], [address] [place], [place] [address]   MRSA PCR Screening     Status: None   Collection Time: 10/25/19  2:50 PM   Specimen: Nasopharyngeal  Result Value Ref Range Status   MRSA by PCR NEGATIVE NEGATIVE  Final    Comment:        The GeneXpert MRSA Assay (FDA approved for NASAL specimens only), is one component of a comprehensive MRSA colonization surveillance program. It is not intended to diagnose MRSA infection nor to guide or monitor treatment for MRSA infections. Performed at [facility], [address] [place], [place] [address]   CULTURE, BLOOD (ROUTINE X 2) w Reflex to ID Panel     Status: None   Collection Time: 10/25/19  4:29 PM   Specimen: BLOOD  Result Value Ref Range Status   Specimen Description BLOOD CENTRAL LINE  Final   Special Requests   Final    BOTTLES DRAWN AEROBIC AND ANAEROBIC Blood Culture adequate volume   Culture   Final    NO GROWTH 5 DAYS Performed at [facility], [address] [place], [place] [address]    Report Status 10/30/2019 FINAL  Final  CULTURE, BLOOD (ROUTINE X  2) w Reflex to ID Panel     Status: None   Collection Time: 10/26/19  6:45 AM   Specimen: BLOOD  Result Value Ref Range Status   Specimen Description BLOOD BLOOD LEFT HAND  Final   Special Requests   Final    BOTTLES DRAWN AEROBIC AND ANAEROBIC Blood Culture adequate volume   Culture   Final    NO GROWTH 5 DAYS Performed at [facility], [address], [place], [place] [address]    Report Status 10/31/2019 FINAL  Final  Body fluid culture     Status: None   Collection Time: 10/26/19 11:30 AM   Specimen: Fluid  Result Value Ref Range Status   Specimen Description   Final    FLUID Performed at [facility], [address], [place], [place] [address]    Special Requests   Final    PERITONEAL Performed at [facility], [address] [place], [place] [address]    Gram Stain   Final    ABUNDANT WBC PRESENT, PREDOMINANTLY PMN NO ORGANISMS SEEN    Culture   Final    NO GROWTH Performed at [facility], [address], [place], [place] [address]    Report Status 10/30/2019 FINAL  Final  Body fluid culture      Status: None   Collection Time: 10/29/19  1:39 PM   Specimen: PATH Cytology Peritoneal fluid  Result Value Ref Range Status   Specimen Description   Final    PERITONEAL Performed at [facility], [address] [place], [place] [address]    Special Requests   Final    NONE Performed at [facility], [address] [place], [place] [address]    Gram Stain   Final    MODERATE WBC PRESENT, PREDOMINANTLY PMN NO ORGANISMS SEEN    Culture   Final    NO GROWTH 3 DAYS Performed at [facility], [place] [address], [place], [place] [address]    Report Status 11/02/2019 FINAL  Final  Gram stain     Status: None   Collection Time: 11/02/19  3:30 PM   Specimen: PATH Cytology Peritoneal fluid  Result Value Ref Range Status   Specimen Description PERITONEAL  Final   Special Requests NONE  Final   Gram Stain   Final    WBC SEEN RED BLOOD CELLS NO ORGANISMS SEEN Performed at [facility], [address] [place], [place] [address]    Report Status 11/02/2019 FINAL  Final    Coagulation Studies: No results for input(s): LABPROT, INR in the last 72 hours.  Urinalysis: No results for input(s): COLORURINE, LABSPEC, PHURINE, GLUCOSEU, HGBUR, BILIRUBINUR, KETONESUR, PROTEINUR, UROBILINOGEN, NITRITE, LEUKOCYTESUR in the last 72 hours.  Invalid input(s): APPERANCEUR    Imaging: US Paracentesis  Result Date: 11/03/2019 INDICATION: Patient with history of end-stage disease, cirrhosis, previous SBP, recurrent ascites. Request to IR for diagnostic and therapeutic paracentesis. EXAM: ULTRASOUND GUIDED DIAGNOSTIC AND THERAPEUTIC PARACENTESIS MEDICATIONS: 10 mL 1% lidocaine COMPLICATIONS: None immediate. PROCEDURE: Informed written consent was obtained from the patient after a discussion of the risks, benefits and alternatives to treatment. A timeout was performed prior to the initiation of the procedure. Initial ultrasound scanning demonstrates a moderate amount  of loculated ascites within the left lower abdominal quadrant. The left lower abdomen was prepped and draped in the usual sterile fashion. 1% lidocaine was used for local anesthesia. Following this, a 6 Fr Safe-T-Centesis catheter was  introduced. An ultrasound image was saved for documentation purposes. The paracentesis was performed. The catheter was removed and a dressing was applied. The patient tolerated the procedure well without immediate post procedural complication. FINDINGS: A total of approximately 1.0 L of clear, golden yellow fluid was removed. Samples were sent to the laboratory as requested by the clinical team. IMPRESSION: Successful ultrasound-guided paracentesis yielding 1.0 liters of peritoneal fluid. Read by Candiss Norse, PA-C Electronically Signed   By: Aletta Edouard M.D.   On: 11/02/2019 16:02     Medications:   . sodium chloride Stopped (11/01/19 1131)  . cefTRIAXone (ROCEPHIN)  IV 2 g (11/03/19 0842)   . Chlorhexidine Gluconate Cloth  6 each Topical Daily  . furosemide  60 mg Intravenous BID  . mouth rinse  15 mL Mouth Rinse BID  . melatonin  2.5 mg Oral QHS  . pantoprazole  40 mg Oral BID  . sodium chloride flush  3 mL Intravenous Q12H   sodium chloride, acetaminophen, albuterol, alum & mag hydroxide-simeth **AND** lidocaine, bisacodyl, calcium carbonate, Glycerin (Adult), loperamide, midodrine, ondansetron (ZOFRAN) IV, ondansetron (ZOFRAN) IV, oxyCODONE-acetaminophen, sodium chloride flush  Assessment/ Plan:  [name] is a 64 y.o. black female with congestive heart failure, hypertension, hyperlipidemia, gout, COPD, anemia, cocaine abuse who is admitted to [facility] on 10/19/2019 for Peripheral edema [R60.9] Acute CHF (congestive heart failure) ([place]) [I50.9] AKI (acute kidney injury) ([place]) [N17.9] Congestive heart failure, unspecified HF chronicity, unspecified heart failure type ([place]) [I50.9]  1. Acute kidney injury on chronic kidney disease stage IIIB.  Baseline creatinine of 1.66, GFR of 32. With history of proteinuria.  Chronic kidney disease secondary to hypertension  Acute renal failure secondary to acute cardiorenal syndrome  -Patient received  Dialysis with UF only session on 10/26/19 , with 1.5L fluid removal. Lab Results  Component Value Date   CREATININE 3.67 (H) 11/02/2019   CREATININE 3.50 (H) 11/01/2019   CREATININE 3.97 (H) 10/31/2019  Urine Output for the preceding 24 hours is 1550 ml -Will continue monitoring renal function closely -No acute indication for dialysis   2. Hypotension with acute exacerbation of systolic and diastolic congestive heart failure. Echocardiogram 06/22/19 EF of 25-30%.  -on 2 L nasal cannula,,patient keeps it as needed -On Furosemide IV BID -Blood pressure readings within low normal range -Patient is on Midodrine  3. Ascites/ Bacterial Peritonitis Patient is on IV antibiotics,Rocephin  Paracentesis done during this admission Gastroenterology team managing care  4. Anemia of Chronic Disease  Hemoglobin 8.9 today      LOS: 15 Devontre Siedschlag 10/26/202111:46 AM

## 2019-11-03 NOTE — Progress Notes (Signed)
PT Cancellation Note  Patient Details Name: Kyah Buesing MRN: [id number] DOB: 1955-08-29   Cancelled Treatment:    Reason Eval/Treat Not Completed:  (Evaluation re-attempted.  Patient continues with significant agitation upon attempts, refusing participation and yelling profanity at therapy staff in room.  Will continue efforts one additional date per guidelines; will complete order if refusal persist)   Sebastian Lurz H. Owens Shark, PT, DPT, NCS 11/03/19, 11:20 AM [phone]

## 2019-11-03 NOTE — Progress Notes (Addendum)
PROGRESS NOTE    Katelyn Lane  ION:[id number] DOB: 07-Dec-1955 DOA: 10/19/2019 PCP: [name], MD   Chief complaint.  Shortness of breath. 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 Found to have BNP greater than 4500, elevated troponin and AKI.  Multiple prior admissions with similar symptoms and complaints. Prolonged hospitalization in August 2021 when she was discharged to SNF. Not clear when she get out of SNF. Continues to complain that she is homeless but per social worker she do have a house. Not take her medication despite being provided with medical management.  After admission to the hospital, she was diagnosed with acute on chronic combinedsystolicanddiastoliccongestiveheartfailurewith ejection fraction 25 to 30%. She was not responding to IV Lasix. She was seen by nephrology, temporary dialysis catheter was performed and she was dialyzed on 10/17 and 10/18. Patient also complaining of abdominal pain, she had a paracentesis, study showed possible spontaneous peritonitis, she was treated with Rocephin and Flagyl.  Patient had a worsening respiratory status on 10/17, was diagnosed with aspiration pneumonia. She was treated with Rocephin, Zithromax and Flagyl. Patient also had abdominal pain and a CT scan suspect ischemia, but seen by general surgery on 10/19, no need for surgery.  10/21.Repeat paracentesis performed, antibiotic changed to Rocephin only. Permacath placed.  10/22.Repeated paracentesis showed white cell count of 3048, 69% neutrophils. However, patient clinically improving. Continue Rocephin  10/26.  Repeated thoracentesis showed white cell count of  1636.    Assessment & Plan:   Principal Problem:   Spontaneous bacterial peritonitis ([place]) Active Problems:   AKI (acute kidney injury) ([place])   Crack cocaine use   Acute respiratory failure with hypoxia ([facility])   LV (left ventricular) mural thrombus   Nonsustained ventricular tachycardia ([facility])   Acute CHF (congestive heart failure) ([facility])   Cocaine abuse ([place])   Peripheral edema   Cirrhosis of liver ([facility])   Dementia ([facility])   Aspiration pneumonia of both lower lobes due to gastric secretions ([place])  #1.  Spontaneous bacterial peritonitis. Repeat paracentesis showed significant decrease of white cell in ascites, antibiotics effective.  Culture has been negative.  Continue Rocephin.  2.  Acute on chronic combined systolic and diastolic congestive heart failure. Acute hypoxemic respite failure. Patient has increased short of breath today, increased crackles in the base.  Appears to have some volume overload.  Will discuss with nephrology for possible ultrafiltration.  Patient has not been responding to IV Lasix.  3.  Liver cirrhosis with ascites. Continue to follow.  4.  Acute renal failure on chronic kidney disease stage IIIb. Renal function appears to be improving.  Patient still has permacath placed. Due to patient poor response to diuretics, she may need scheduled ultrafiltration.  However, due to patient multidrug abuse, if not may not be safe to leave a permacath.  5.  Left ventricular thrombus with history of CVA. Patient was initially concerned for possible GI bleed, anticoagulation was on hold.  However, there is no GI bleed observed while in the hospital.  GI was not concerned about it.  It is not safe to continue hold anticoagulation.  At this point, I restarted.  Monitor hemoglobin.  6.  Anemia of chronic disease. Hemoglobin still stable.  Patient had a normal B12 level recently, but she also has  mild iron deficiency.  Oral supplement.  7.  Hypokalemia. Supplemented.   Patient has been approved for nursing home placement.  Has a bed available.   DVT prophylaxis: Eliquis Code Status: Full Family Communication: None available Disposition Plan:  .   Status is: Inpatient  Remains inpatient appropriate because:Inpatient level of care appropriate due to severity of illness   Dispo: The patient is from: Home              Anticipated d/c is to: SNF              Anticipated d/c date is: 2 days              Patient currently is not medically stable to d/c.        I/O last 3 completed shifts: In: 30 [P.O.:720] Out: 56 [Urine:1550] Total I/O In: 120 [P.O.:120] Out: -      Consultants:   Nephrology  Procedures: None  Antimicrobials:  Rocephin.  Subjective: Patient is very hostile to the staff, but she does not have any confusion.  She slept well last night. She has some increased short of breath, no cough. She has the occasional abdominal pain, but no nausea vomiting, tolerating diet well.  No additional diarrhea. She does not have any dysuria hematuria. no fever chills.  Objective: Vitals:   11/03/19 0410 11/03/19 0411 11/03/19 0836 11/03/19 1110  BP:  100/70 104/70 105/66  Pulse:  92 96 95  Resp:   17 17  Temp:   97.9 F (36.6 C) 97.9 F (36.6 C)  TempSrc:   Oral   SpO2:  97% 100% 99%  Weight: 95.4 kg     Height:        Intake/Output Summary (Last 24 hours) at 11/03/2019 1326 Last data filed at 11/03/2019 1026 Gross per 24 hour  Intake 120 ml  Output 450 ml  Net -330 ml   Filed Weights   11/01/19 0357 11/02/19 0620 11/03/19 0410  Weight: 95.8 kg 97.8 kg 95.4 kg    Examination:  General exam: Appears calm and comfortable  Respiratory system: Clear to auscultation. Respiratory effort normal. Cardiovascular system: S1 & S2 heard, RRR. No JVD, murmurs, rubs, gallops or clicks. No pedal edema. Gastrointestinal system: Abdomen is nondistended, soft and nontender. No organomegaly or  masses felt. Normal bowel sounds heard. Central nervous system: Alert and oriented x3. No focal neurological deficits. Extremities: Symmetric  Skin: No rashes, lesions or ulcers Psychiatry:  Mood & affect appropriate.     Data Reviewed: I have personally reviewed following labs and imaging studies  CBC: Recent Labs  Lab 10/28/19 0503 10/29/19 0413 11/01/19 6834 11/02/19 0924 11/03/19 0628  WBC 7.7 6.5 9.7 12.9* 11.4*  NEUTROABS  --   --  7.8* 10.6* 9.0*  HGB 10.0* 9.8* 11.9* 10.6* 8.9*  HCT 30.4* 29.3* 36.1 31.0* 26.7*  MCV 71.0* 71.3* 71.1* 69.7* 70.6*  PLT 222 229 295 296 196   Basic Metabolic Panel: Recent Labs  Lab 10/29/19 0413 10/30/19 0550 10/31/19 0446 11/01/19 0633 11/02/19 0924  NA 137 136 137 136 130*  K 4.1 4.0 3.6 3.4* 3.2*  CL 102 99 100 96* 92*  CO2 24 24 27 29 27   GLUCOSE 127* 194* 134* 109* 132*  BUN 73* 79* 77* 72* 71*  CREATININE 4.13* 4.16* 3.97* 3.50* 3.67*  CALCIUM 8.8* 9.2 9.3 9.2 8.3*  MG  --   --   --   --  1.7   GFR: Estimated Creatinine  Clearance: 18.3 mL/min (A) (by C-G formula based on SCr of 3.67 mg/dL (H)). Liver Function Tests: No results for input(s): AST, ALT, ALKPHOS, BILITOT, PROT, ALBUMIN in the last 168 hours. No results for input(s): LIPASE, AMYLASE in the last 168 hours. No results for input(s): AMMONIA in the last 168 hours. Coagulation Profile: No results for input(s): INR, PROTIME in the last 168 hours. Cardiac Enzymes: No results for input(s): CKTOTAL, CKMB, CKMBINDEX, TROPONINI in the last 168 hours. BNP (last 3 results) No results for input(s): PROBNP in the last 8760 hours. HbA1C: No results for input(s): HGBA1C in the last 72 hours. CBG: No results for input(s): GLUCAP in the last 168 hours. Lipid Profile: No results for input(s): CHOL, HDL, LDLCALC, TRIG, CHOLHDL, LDLDIRECT in the last 72 hours. Thyroid Function Tests: No results for input(s): TSH, T4TOTAL, FREET4, T3FREE, THYROIDAB in the last 72  hours. Anemia Panel: No results for input(s): VITAMINB12, FOLATE, FERRITIN, TIBC, IRON, RETICCTPCT in the last 72 hours. Sepsis Labs: No results for input(s): PROCALCITON, LATICACIDVEN in the last 168 hours.  Recent Results (from the past 240 hour(s))  MRSA PCR Screening     Status: None   Collection Time: 10/25/19  2:50 PM   Specimen: Nasopharyngeal  Result Value Ref Range Status   MRSA by PCR NEGATIVE NEGATIVE Final    Comment:        The GeneXpert MRSA Assay (FDA approved for NASAL specimens only), is one component of a comprehensive MRSA colonization surveillance program. It is not intended to diagnose MRSA infection nor to guide or monitor treatment for MRSA infections. Performed at [facility], [address] [place], [place] [address]   CULTURE, BLOOD (ROUTINE X 2) w Reflex to ID Panel     Status: None   Collection Time: 10/25/19  4:29 PM   Specimen: BLOOD  Result Value Ref Range Status   Specimen Description BLOOD CENTRAL LINE  Final   Special Requests   Final    BOTTLES DRAWN AEROBIC AND ANAEROBIC Blood Culture adequate volume   Culture   Final    NO GROWTH 5 DAYS Performed at [facility], [address] [place], [place] [address]    Report Status 10/30/2019 FINAL  Final  CULTURE, BLOOD (ROUTINE X 2) w Reflex to ID Panel     Status: None   Collection Time: 10/26/19  6:45 AM   Specimen: BLOOD  Result Value Ref Range Status   Specimen Description BLOOD BLOOD LEFT HAND  Final   Special Requests   Final    BOTTLES DRAWN AEROBIC AND ANAEROBIC Blood Culture adequate volume   Culture   Final    NO GROWTH 5 DAYS Performed at [facility], [address] [place], [place] [address]    Report Status 10/31/2019 FINAL  Final  Body fluid culture     Status: None   Collection Time: 10/26/19 11:30 AM   Specimen: Fluid  Result Value Ref Range Status   Specimen Description   Final    FLUID Performed at [facility], [address], [place],  [address]    Special Requests   Final    PERITONEAL Performed at [facility], [address] [place],  [address]    Gram Stain   Final    ABUNDANT WBC PRESENT, PREDOMINANTLY PMN NO ORGANISMS SEEN    Culture   Final    NO GROWTH Performed at [facility], [place] [address], [place], [place]  [address]    Report Status 10/30/2019 FINAL  Final  Body fluid culture     Status: None   Collection Time: 10/29/19  1:39 PM   Specimen: PATH Cytology Peritoneal fluid  Result Value Ref Range Status   Specimen Description   Final    PERITONEAL Performed at [facility], [address] [place], [place] [address]    Special Requests   Final    NONE Performed at [facility], [address] [place], [place] [address]    Gram Stain   Final    MODERATE WBC PRESENT, PREDOMINANTLY PMN NO ORGANISMS SEEN    Culture   Final    NO GROWTH 3 DAYS Performed at [facility], [place] [address], [place], [place] [address]    Report Status 11/02/2019 FINAL  Final  Gram stain     Status: None   Collection Time: 11/02/19  3:30 PM   Specimen: PATH Cytology Peritoneal fluid  Result Value Ref Range Status   Specimen Description PERITONEAL  Final   Special Requests NONE  Final   Gram Stain   Final    WBC SEEN RED BLOOD CELLS NO ORGANISMS SEEN Performed at [facility], [address] [place], [place] [address]    Report Status 11/02/2019 FINAL  Final         Radiology Studies: US Paracentesis  Result Date: 11/03/2019 INDICATION: Patient with history of end-stage disease, cirrhosis, previous SBP, recurrent ascites. Request to IR for diagnostic and therapeutic paracentesis. EXAM: ULTRASOUND GUIDED DIAGNOSTIC AND THERAPEUTIC PARACENTESIS MEDICATIONS: 10 mL 1% lidocaine COMPLICATIONS: None immediate. PROCEDURE: Informed written consent was obtained from the patient after a discussion of the risks, benefits and alternatives  to treatment. A timeout was performed prior to the initiation of the procedure. Initial ultrasound scanning demonstrates a moderate amount of loculated ascites within the left lower abdominal quadrant. The left lower abdomen was prepped and draped in the usual sterile fashion. 1% lidocaine was used for local anesthesia. Following this, a 6 Fr Safe-T-Centesis catheter was introduced. An ultrasound image was saved for documentation purposes. The paracentesis was performed. The catheter was removed and a dressing was applied. The patient tolerated the procedure well without immediate post procedural complication. FINDINGS: A total of approximately 1.0 L of clear, golden yellow fluid was removed. Samples were sent to the laboratory as requested by the clinical team. IMPRESSION: Successful ultrasound-guided paracentesis yielding 1.0 liters of peritoneal fluid. Read by Candiss Norse, PA-C Electronically Signed   By: Aletta Edouard M.D.   On: 11/02/2019 16:02        Scheduled Meds: . Chlorhexidine Gluconate Cloth  6 each Topical Daily  . furosemide  60 mg Intravenous BID  . mouth rinse  15 mL Mouth Rinse BID  . melatonin  2.5 mg Oral QHS  . pantoprazole  40 mg Oral BID  . sodium chloride flush  3 mL Intravenous Q12H   Continuous Infusions: . sodium chloride Stopped (11/01/19 1131)  . cefTRIAXone (ROCEPHIN)  IV 2 g (11/03/19 0842)     LOS: 15 days    Time spent: 35 minutes    Sharen Hones, MD [facility]   To contact the attending provider between 7A-7P or the covering provider during after hours 7P-7A, please log into the web site [email or web address] and access using universal [place] password for that web site. If you do not have the password, please call the hospital operator.  11/03/2019, 1:26 PM

## 2019-11-04 DIAGNOSIS — K652 Spontaneous bacterial peritonitis: Secondary | ICD-10-CM | POA: Diagnosis not present

## 2019-11-04 LAB — BASIC METABOLIC PANEL
Anion gap: 10 (ref 5–15)
BUN: 64 mg/dL — ABNORMAL HIGH (ref 8–23)
CO2: 29 mmol/L (ref 22–32)
Calcium: 8.9 mg/dL (ref 8.9–10.3)
Chloride: 95 mmol/L — ABNORMAL LOW (ref 98–111)
Creatinine, Ser: 3.24 mg/dL — ABNORMAL HIGH (ref 0.44–1.00)
GFR, Estimated: 15 mL/min — ABNORMAL LOW (ref >60)
Glucose, Bld: 118 mg/dL — ABNORMAL HIGH (ref 70–99)
Potassium: 3.3 mmol/L — ABNORMAL LOW (ref 3.5–5.1)
Sodium: 134 mmol/L — ABNORMAL LOW (ref 135–145)

## 2019-11-04 LAB — AMMONIA: Ammonia: 27 umol/L (ref 9–35)

## 2019-11-04 LAB — CBC WITH DIFFERENTIAL/PLATELET
Abs Immature Granulocytes: 0.04 10*3/uL (ref 0.00–0.07)
Basophils Absolute: 0.1 10*3/uL (ref 0.0–0.1)
Basophils Relative: 1 %
Eosinophils Absolute: 0.2 10*3/uL (ref 0.0–0.5)
Eosinophils Relative: 2 %
HCT: 28.4 % — ABNORMAL LOW (ref 36.0–46.0)
Hemoglobin: 9.4 g/dL — ABNORMAL LOW (ref 12.0–15.0)
Immature Granulocytes: 1 %
Lymphocytes Relative: 8 %
Lymphs Abs: 0.7 10*3/uL (ref 0.7–4.0)
MCH: 23.2 pg — ABNORMAL LOW (ref 26.0–34.0)
MCHC: 33.1 g/dL (ref 30.0–36.0)
MCV: 70 fL — ABNORMAL LOW (ref 80.0–100.0)
Monocytes Absolute: 1.2 10*3/uL — ABNORMAL HIGH (ref 0.1–1.0)
Monocytes Relative: 14 %
Neutro Abs: 6.6 10*3/uL (ref 1.7–7.7)
Neutrophils Relative %: 74 %
Platelets: 386 10*3/uL (ref 150–400)
RBC: 4.06 MIL/uL (ref 3.87–5.11)
RDW: 21 % — ABNORMAL HIGH (ref 11.5–15.5)
WBC: 8.8 10*3/uL (ref 4.0–10.5)
nRBC: 0 % (ref 0.0–0.2)

## 2019-11-04 LAB — PHOSPHORUS: Phosphorus: 2.9 mg/dL (ref 2.5–4.6)

## 2019-11-04 NOTE — Progress Notes (Signed)
PT Cancellation Note  Patient Details Name: Katelyn Lane MRN: [id number] DOB: 05/17/1955   Cancelled Treatment:    Reason Eval/Treat Not Completed: Patient at procedure or test/unavailable (Patient currently off unit for dialysis; will re-attempt at later time/date as medically appropriate and available.)    Jacy Howat H. Owens Shark, PT, DPT, NCS 11/04/19, 10:00 AM [phone]

## 2019-11-04 NOTE — Progress Notes (Signed)
[facility]  ROUNDING NOTE   Subjective:   Katelyn Lane admitted on 10/19/2019 for Peripheral edema [R60.9] Acute CHF (congestive heart failure) ([place]) [I50.9] AKI (acute kidney injury) ([place]) [N17.9] Congestive heart failure, unspecified HF chronicity, unspecified heart failure type ([place]) [T01.6] Acute diastolic (congestive) heart failure ([place]) [I50.31]  Patient seen in dialysis today, receiving treatment. Patient is not tolerating ultrafiltration very well, blood pressure dropped to low 01U systolic.    HEMODIALYSIS FLOWSHEET:  Blood Flow Rate (mL/min): 250 mL/min Arterial Pressure (mmHg): -200 mmHg Venous Pressure (mmHg): 280 mmHg Transmembrane Pressure (mmHg): 70 mmHg Ultrafiltration Rate (mL/min): 230 mL/min Dialysate Flow Rate (mL/min): 300 ml/min Conductivity: Machine : 13.6 Conductivity: Machine : 13.6 Dialysis Fluid Bolus: Normal Saline Bolus Amount (mL): 200 mL   Objective:  Vital signs in last 24 hours:  Temp:  [97.8 F (36.6 C)-98.6 F (37 C)] 98.3 F (36.8 C) (10/27 1355) Pulse Rate:  [70-99] 98 (10/27 1355) Resp:  [15-25] 18 (10/27 1355) BP: (82-110)/(60-84) 82/64 (10/27 1355) SpO2:  [94 %-100 %] 100 % (10/27 1355)  Weight change:  Filed Weights   11/01/19 0357 11/02/19 0620 11/03/19 0410  Weight: 95.8 kg 97.8 kg 95.4 kg    Intake/Output: I/O last 3 completed shifts: In: 360 [P.O.:360] Out: 2 [Urine:1; Stool:1]   Intake/Output this shift:  Total I/O In: -  Out: 301 [Urine:1; Other:300]  Physical Exam: General:  Resting in bed, receiving dialysis treatment  Head  Normocephalic, atraumatic  Lungs:   Respiration symmetrical, unlabored, lungs diminished at the bases, on 3 L of O2 via nasal cannula  Heart:  S1S2, no rubs or gallops  Abdomen:   Soft, nontender  Extremities:  2+ peripheral edema  Neurologic:  Awake, alert, oriented  Skin: No acute lesions or rashes  Rt IJ catheter  Basic Metabolic Panel: Recent Labs  Lab  10/30/19 0550 10/30/19 0550 10/31/19 0446 10/31/19 0446 11/01/19 0633 11/02/19 0924 11/04/19 0454 11/04/19 1109  NA 136  --  137  --  136 130* 134*  --   K 4.0  --  3.6  --  3.4* 3.2* 3.3*  --   CL 99  --  100  --  96* 92* 95*  --   CO2 24  --  27  --  29 27 29   --   GLUCOSE 194*  --  134*  --  109* 132* 118*  --   BUN 79*  --  77*  --  72* 71* 64*  --   CREATININE 4.16*  --  3.97*  --  3.50* 3.67* 3.24*  --   CALCIUM 9.2   < > 9.3   < > 9.2 8.3* 8.9  --   MG  --   --   --   --   --  1.7  --   --   PHOS  --   --   --   --   --   --   --  2.9   < > = values in this interval not displayed.    Liver Function Tests: No results for input(s): AST, ALT, ALKPHOS, BILITOT, PROT, ALBUMIN in the last 168 hours. No results for input(s): LIPASE, AMYLASE in the last 168 hours. Recent Labs  Lab 11/04/19 0454  AMMONIA 27    CBC: Recent Labs  Lab 10/29/19 0413 11/01/19 9323 11/02/19 0924 11/03/19 0628 11/04/19 0454  WBC 6.5 9.7 12.9* 11.4* 8.8  NEUTROABS  --  7.8* 10.6* 9.0* 6.6  HGB 9.8* 11.9* 10.6* 8.9* 9.4*  HCT 29.3* 36.1 31.0* 26.7* 28.4*  MCV 71.3* 71.1* 69.7* 70.6* 70.0*  PLT 229 295 296 313 386    Cardiac Enzymes: No results for input(s): CKTOTAL, CKMB, CKMBINDEX, TROPONINI in the last 168 hours.  BNP: Invalid input(s): POCBNP  CBG: No results for input(s): GLUCAP in the last 168 hours.  Microbiology: Results for orders placed or performed during the hospital encounter of 10/19/19  Respiratory Panel by RT PCR (Flu A&B, Covid) - Nasopharyngeal Swab     Status: None   Collection Time: 10/19/19 11:13 PM   Specimen: Nasopharyngeal Swab  Result Value Ref Range Status   SARS Coronavirus 2 by RT PCR NEGATIVE NEGATIVE Final    Comment: (NOTE) SARS-CoV-2 target nucleic acids are NOT DETECTED.  The SARS-CoV-2 RNA is generally detectable in upper respiratoy specimens during the acute phase of infection. The lowest concentration of SARS-CoV-2 viral copies this assay can  detect is 131 copies/mL. A negative result does not preclude SARS-Cov-2 infection and should not be used as the sole basis for treatment or other patient management decisions. A negative result may occur with  improper specimen collection/handling, submission of specimen other than nasopharyngeal swab, presence of viral mutation(s) within the areas targeted by this assay, and inadequate number of viral copies (<131 copies/mL). A negative result must be combined with clinical observations, patient history, and epidemiological information. The expected result is Negative.  Fact Sheet for Patients:  PinkCheek.be  Fact Sheet for Healthcare Providers:  GravelBags.it  This test is no t yet approved or cleared by the [place] FDA and  has been authorized for detection and/or diagnosis of SARS-CoV-2 by FDA under an Emergency Use Authorization (EUA). This EUA will remain  in effect (meaning this test can be used) for the duration of the COVID-19 declaration under Section 564(b)(1) of the Act, 21 U.S.C. section 360bbb-3(b)(1), unless the authorization is terminated or revoked sooner.     Influenza A by PCR NEGATIVE NEGATIVE Final   Influenza B by PCR NEGATIVE NEGATIVE Final    Comment: (NOTE) The Xpert Xpress SARS-CoV-2/FLU/RSV assay is intended as an aid in  the diagnosis of influenza from Nasopharyngeal swab specimens and  should not be used as a sole basis for treatment. Nasal washings and  aspirates are unacceptable for Xpert Xpress SARS-CoV-2/FLU/RSV  testing.  Fact Sheet for Patients: PinkCheek.be  Fact Sheet for Healthcare Providers: GravelBags.it  This test is not yet approved or cleared by the [place] FDA and  has been authorized for detection and/or diagnosis of SARS-CoV-2 by  FDA under an Emergency Use Authorization (EUA). This EUA will remain  in  effect (meaning this test can be used) for the duration of the  Covid-19 declaration under Section 564(b)(1) of the Act, 21  U.S.C. section 360bbb-3(b)(1), unless the authorization is  terminated or revoked. Performed at [facility], [address] [place], [place] [address]   MRSA PCR Screening     Status: None   Collection Time: 10/25/19  2:50 PM   Specimen: Nasopharyngeal  Result Value Ref Range Status   MRSA by PCR NEGATIVE NEGATIVE Final    Comment:        The GeneXpert MRSA Assay (FDA approved for NASAL specimens only), is one component of a comprehensive MRSA colonization surveillance program. It is not intended to diagnose MRSA infection nor to guide or monitor treatment for MRSA infections. Performed at [facility], [address] [place], [place] [address]  CULTURE, BLOOD (ROUTINE X 2) w Reflex to ID Panel     Status: None   Collection Time: 10/25/19  4:29 PM   Specimen: BLOOD  Result Value Ref Range Status   Specimen Description BLOOD CENTRAL LINE  Final   Special Requests   Final    BOTTLES DRAWN AEROBIC AND ANAEROBIC Blood Culture adequate volume   Culture   Final    NO GROWTH 5 DAYS Performed at [facility], [address] [place], [place] [address]    Report Status 10/30/2019 FINAL  Final  CULTURE, BLOOD (ROUTINE X 2) w Reflex to ID Panel     Status: None   Collection Time: 10/26/19  6:45 AM   Specimen: BLOOD  Result Value Ref Range Status   Specimen Description BLOOD BLOOD LEFT HAND  Final   Special Requests   Final    BOTTLES DRAWN AEROBIC AND ANAEROBIC Blood Culture adequate volume   Culture   Final    NO GROWTH 5 DAYS Performed at [facility], [address] [place], [place] [address]    Report Status 10/31/2019 FINAL  Final  Body fluid culture     Status: None   Collection Time: 10/26/19 11:30 AM   Specimen: Fluid  Result Value Ref Range Status   Specimen Description   Final    FLUID Performed  at [facility], [address], [place], [place] [address]    Special Requests   Final    PERITONEAL Performed at [facility], [address] [place], [place] [address]    Gram Stain   Final    ABUNDANT WBC PRESENT, PREDOMINANTLY PMN NO ORGANISMS SEEN    Culture   Final    NO GROWTH Performed at [facility], [place] [address] [place], [place] [address]    Report Status 10/30/2019 FINAL  Final  Body fluid culture     Status: None   Collection Time: 10/29/19  1:39 PM   Specimen: PATH Cytology Peritoneal fluid  Result Value Ref Range Status   Specimen Description   Final    PERITONEAL Performed at [facility], [address] [place], [place] [address]    Special Requests   Final    NONE Performed at [facility], [address] [place], [place] [address]    Gram Stain   Final    MODERATE WBC PRESENT, PREDOMINANTLY PMN NO ORGANISMS SEEN    Culture   Final    NO GROWTH 3 DAYS Performed at [facility], [place] [address], [place], [place] [address]    Report Status 11/02/2019 FINAL  Final  Gram stain     Status: None   Collection Time: 11/02/19  3:30 PM   Specimen: PATH Cytology Peritoneal fluid  Result Value Ref Range Status   Specimen Description PERITONEAL  Final   Special Requests NONE  Final   Gram Stain   Final    WBC SEEN RED BLOOD CELLS NO ORGANISMS SEEN Performed at [facility], [address] [place], [place] [address]    Report Status 11/02/2019 FINAL  Final    Coagulation Studies: No results for input(s): LABPROT, INR in the last 72 hours.  Urinalysis: No results for input(s): COLORURINE, LABSPEC, PHURINE, GLUCOSEU, HGBUR, BILIRUBINUR, KETONESUR, PROTEINUR, UROBILINOGEN, NITRITE, LEUKOCYTESUR in the last 72 hours.  Invalid input(s): APPERANCEUR    Imaging: US Paracentesis  Result Date: 11/03/2019 INDICATION: Patient with history of end-stage disease, cirrhosis, previous SBP,  recurrent ascites. Request to IR for diagnostic and therapeutic paracentesis. EXAM: ULTRASOUND GUIDED DIAGNOSTIC AND THERAPEUTIC PARACENTESIS MEDICATIONS: 10 mL 1% lidocaine COMPLICATIONS: None immediate. PROCEDURE: Informed written consent was obtained from the patient after a discussion of the risks, benefits and alternatives to treatment. A timeout was performed prior to the initiation of the procedure. Initial ultrasound scanning demonstrates a moderate amount of loculated ascites within the left lower abdominal quadrant. The left lower abdomen was prepped and draped in the usual sterile fashion. 1% lidocaine was used for local anesthesia. Following this, a 6 Fr Safe-T-Centesis catheter was introduced. An ultrasound image was saved for documentation purposes. The paracentesis was performed. The catheter was removed and a dressing was applied. The patient tolerated the procedure well without immediate post procedural complication. FINDINGS: A total of approximately 1.0 L of clear, golden yellow fluid was removed. Samples were sent to the laboratory as requested by the clinical team. IMPRESSION: Successful ultrasound-guided paracentesis yielding 1.0 liters of peritoneal fluid. Read by Candiss Norse, PA-C Electronically Signed   By: Aletta Edouard M.D.   On: 11/02/2019 16:02     Medications:   . sodium chloride Stopped (11/01/19 1131)  . cefTRIAXone (ROCEPHIN)  IV 2 g (11/03/19 0842)   . apixaban  5 mg Oral BID  . Chlorhexidine Gluconate Cloth  6 each Topical Daily  . feeding supplement (NEPRO CARB STEADY)  237 mL Oral Q24H  . furosemide  60 mg Intravenous BID  . iron polysaccharides  150 mg Oral Daily  . mouth rinse  15 mL Mouth Rinse BID  . melatonin  2.5 mg Oral QHS  . pantoprazole  40 mg Oral BID  . sodium chloride flush  3 mL Intravenous Q12H   sodium chloride, acetaminophen, albuterol, alum & mag hydroxide-simeth **AND** lidocaine, bisacodyl, calcium carbonate, Glycerin (Adult),  loperamide, midodrine, ondansetron (ZOFRAN) IV, ondansetron (ZOFRAN) IV, oxyCODONE-acetaminophen, sodium chloride flush  Assessment/ Plan:  Katelyn Lane is a 64 y.o. black female with congestive heart failure, hypertension, hyperlipidemia, gout, COPD, anemia, cocaine abuse who is admitted to [facility] on 10/19/2019 for Peripheral edema [R60.9] Acute CHF (congestive heart failure) ([place]) [I50.9] AKI (acute kidney injury) ([place]) [N17.9] Congestive heart failure, unspecified HF chronicity, unspecified heart failure type (HCC) [Z00.1] Acute diastolic (congestive) heart failure ([place]) [I50.31]  1. Acute kidney injury on chronic kidney disease stage IIIB. Baseline creatinine of 1.66, GFR of 32. With history of proteinuria.  Chronic kidney disease secondary to hypertension  Acute renal failure secondary to acute cardiorenal syndrome   Lab Results  Component Value Date   CREATININE 3.24 (H) 11/04/2019   CREATININE 3.67 (H) 11/02/2019   CREATININE 3.50 (H) 11/01/2019  Patient is receiving dialysis treatment today She is unable to tolerate ultrafiltration very well, as she is hypotensive,BP dropping in the low 90s  and high 74'B systolic and she was slightly symptomatic,reports 'not feeling well'  2. Hypotension with acute exacerbation of systolic and diastolic congestive heart failure. Echocardiogram 06/22/19 EF of 25-30%. Patient denies worsening shortness of breath Continues to require supplemental O2, 2 to 3 L intermittently Blood pressure readings during dialysis was systolic in low 44H She is on midodrine   4. Anemia of Chronic Disease  Patient's hemoglobin level stable, 9.4 today      LOS: 16 Shannin Naab 10/27/20213:07 PM

## 2019-11-04 NOTE — Progress Notes (Addendum)
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 Found to have BNP greater than 4500, elevated troponin and AKI.  Multiple prior admissions with similar symptoms and complaints. Prolonged hospitalization in August 2021 when she was discharged to SNF. Not clear when she get out of SNF. Continues to complain that she is homeless but per social worker she do have a house. Not take her medication despite being provided with medical management.  After admission to the hospital, she was diagnosed with acute on chronic combinedsystolicanddiastoliccongestiveheartfailurewith ejection fraction 25 to 30%. She was not responding to IV Lasix. She was seen by nephrology, temporary dialysis catheter was performed and she was dialyzed on 10/17 and 10/18. Patient also complaining of abdominal pain, she had a paracentesis, study showed possible spontaneous peritonitis, she was treated with Rocephin and Flagyl.  Patient had a worsening respiratory status on 10/17, was diagnosed with aspiration pneumonia. She was treated with Rocephin, Zithromax and Flagyl. Patient also had abdominal pain and a CT scan suspect ischemia, but seen by general surgery on 10/19, no need for surgery.  10/21.Repeat paracentesis performed, antibiotic changed to Rocephin only. Permacath placed.  10/22.Repeated paracentesis showed white cell count of 3048, 69% neutrophils. However, patient clinically improving. Continue Rocephin  10/26.  Repeated paracentesis showed white cell count of 1636.   Consultants   . Gastroenterology . Psychiatry . General Surgery . Vascular surgery . Cardiology . Nephrology  Procedures  . Paracentesis x 3 . Placement of temporary dialysis catheter. . Dialysis  Antibiotics   Anti-infectives (From admission, onward)   Start     Dose/Rate Route Frequency Ordered Stop   11/01/19 1000  cefTRIAXone (ROCEPHIN) 2 g in sodium chloride 0.9 % 100 mL IVPB        2 g 200 mL/hr over 30 Minutes Intravenous Daily 10/31/19 1521     10/27/19 1000  cefTRIAXone (ROCEPHIN) 2 g in sodium chloride 0.9 % 100 mL IVPB  Status:  Discontinued        2 g 200 mL/hr over 30 Minutes Intravenous Daily 10/26/19 1559 10/31/19 1521   10/26/19 2000  cefTRIAXone (ROCEPHIN) 1 g in sodium chloride 0.9 % 100 mL IVPB        1 g 200 mL/hr over 30 Minutes Intravenous  Once 10/26/19 1559 10/26/19 2024   10/26/19 1600  cefTRIAXone (ROCEPHIN) 2 g in sodium chloride 0.9 % 100 mL IVPB  Status:  Discontinued        2 g 200 mL/hr over 30 Minutes Intravenous Every 24 hours 10/26/19 1351 10/26/19 1600   10/26/19 1000  metroNIDAZOLE (FLAGYL) IVPB 500 mg  Status:  Discontinued        500 mg 100 mL/hr over 60 Minutes Intravenous Every 8 hours 10/26/19 0909 10/29/19 1138   10/25/19 1445  azithromycin (ZITHROMAX) 500 mg in sodium chloride 0.9 % 250 mL IVPB  Status:  Discontinued        500 mg 250 mL/hr over 60 Minutes Intravenous Every 24 hours 10/25/19 1353 10/29/19 1138   10/25/19 1230  azithromycin (ZITHROMAX) tablet 500 mg  Status:  Discontinued        500 mg Oral Daily 10/25/19 1133 10/25/19 1134   10/25/19  1030  cefTRIAXone (ROCEPHIN) 1 g in sodium chloride 0.9 % 100 mL IVPB  Status:  Discontinued        1 g 200 mL/hr over 30 Minutes Intravenous Every 12 hours 10/25/19 0931 10/26/19 1351    .  Subjective  The patient is resting quietly. She is asking for a podiatrist to come and address her badly overgrown toenails. She states that she has been making appointments with podiatry as outpatient, but by  the time her appointment comes around, she is inpatient again.  Objective   Vitals:  Vitals:   11/04/19 1330 11/04/19 1355  BP: 98/60 (!) 82/64  Pulse: 99 98  Resp:  18  Temp:  98.3 F (36.8 C)  SpO2: 100% 100%   Exam:  Constitutional:  . The patient is awake, alert, and oriented x 3. No acute distress. Respiratory:  . No increased work of breathing. . No wheezes, rales, or rhonchi . No tactile fremitus Cardiovascular:  . Regular rate and rhythm . No murmurs, ectopy, or gallups. . No lateral PMI. No thrills. Abdomen:  . Abdomen is soft, non-tender, non-distended . No hernias, masses, or organomegaly . Normoactive bowel sounds.  Musculoskeletal:  . No cyanosis, clubbing, or edema Skin:  . No rashes, lesions, ulcers . palpation of skin: no induration or nodules Neurologic:  . CN 2-12 intact . Sensation all 4 extremities intact Psychiatric:  . Mental status o Mood, affect appropriate o Orientation to person, place, time  . judgment and insight appear intact I have personally reviewed the following:   Today's Data  . Vitals, BMP, CBC  Lab Data  . Pathology review of peritoneal fluid with cytospin. No malignant cells. Abundant inflammation. 1342 neutrophils.  Micro Data  . Peritoneal fluid: No growth.  Scheduled Meds: . apixaban  5 mg Oral BID  . Chlorhexidine Gluconate Cloth  6 each Topical Daily  . feeding supplement (NEPRO CARB STEADY)  237 mL Oral Q24H  . furosemide  60 mg Intravenous BID  . iron polysaccharides  150 mg Oral Daily  . mouth rinse  15 mL Mouth Rinse BID  . melatonin  2.5 mg Oral QHS  . pantoprazole  40 mg Oral BID  . sodium chloride flush  3 mL Intravenous Q12H   Continuous Infusions: . sodium chloride Stopped (11/01/19 1131)  . cefTRIAXone (ROCEPHIN)  IV 2 g (11/03/19 2993)    Principal Problem:   Spontaneous bacterial peritonitis ([place]) Active Problems:   AKI (acute kidney injury) ([place])   Crack cocaine use   Acute respiratory  failure with hypoxia (HCC)   LV (left ventricular) mural thrombus   Nonsustained ventricular tachycardia (HCC)   Acute CHF (congestive heart failure) (HCC)   Cocaine abuse ([place])   Peripheral edema   Cirrhosis of liver (HCC)   Dementia (HCC)   Aspiration pneumonia of both lower lobes due to gastric secretions (HCC)   Acute diastolic (congestive) heart failure ([place])   LOS: 16 days   A & P  Spontaneous bacterial peritonitis: Repeat paracentesis showed significant decrease of white cell in ascites, antibiotics effective.  Culture has been negative.  Continue Rocephin.  Acute on chronic combined systolic and diastolic congestive heart failure. Acute hypoxemic respiratory failure. Positive for continued rales at bases bilaterally.  Will discuss with nephrology for possible ultrafiltration.  Patient has not been responding to IV Lasix.  Liver cirrhosis with ascites: Noted. Continue to follow.  Acute renal failure on chronic kidney disease stage IIIb. Renal function appears to be  improving.  Patient still has permacath placed. Due to patient poor response to diuretics, she may need scheduled ultrafiltration.  However, due to patient multidrug abuse, if not may not be safe to leave a permacath.  Left ventricular thrombus with history of CVA. Patient was initially concerned for possible GI bleed, anticoagulation was on hold.  However, there is no GI bleed observed while in the hospital.  GI was not concerned about it.  It is not safe to continue hold anticoagulation.  At this point, I restarted.  Monitor hemoglobin.  Overgrown toenails: The patient is complaining of badly over grown toenails. I have explained to her that podiatrists are unable to clut toenails as inpatient. Nurses aren't able to cut them. The patient states that every time she has an appointment to see podiatry for them as outpatient, she is inpatient.   Anemia of chronic disease: Hemoglobin still stable.  Patient had a normal  B12 level recently, but she also has mild iron deficiency.  Oral supplement.  Hypokalemia: Supplement and monitor.   I have seen and examined this patient myself. I have spent 32 minutes in her evaluation and care.  DVT prophylaxis: Eliquis Code Status: Full Family Communication: None available Disposition Plan:   Status is: Inpatient  Remains inpatient appropriate because:Inpatient level of care appropriate due to severity of illness   Dispo: The patient is from: Home  Anticipated d/c is to: SNF. Patient has been approved for nursing home placement.  Has a bed available, but is still requiring dialysis.  Anticipated d/c date is: 2 days  Patient currently is not medically stable to d/c.  Has a bed available, but is still requiring dialysis.  Andrius Andrepont, DO [facility] Direct contact: see [email or web address]  7PM-7AM contact night coverage as above 11/04/2019, 3:15 PM  LOS: 16 days

## 2019-11-04 NOTE — TOC Progression Note (Signed)
Transition of Care [facility]) - Progression Note    Patient Details  Name: Jadelin Eng MRN: [id number] Date of Birth: 06-28-1955  Transition of Care [facility]) CM/SW Contact  Meriel Flavors, [place] Phone Number: 11/04/2019, 9:17 AM  Clinical Narrative:    Patient discussed with supervisors and care team. Recommended for SNF placement/LTC and has accepted bed offer at [facility] in [place], Admissions coordinator started authorization 10/26 and will notify CSW as soon as she receives authorization.           Expected Discharge Plan and Services                                                 Social Determinants of Health (SDOH) Interventions    Readmission Risk Interventions Readmission Risk Prevention Plan 09/02/2019 08/14/2019 07/27/2019  Transportation Screening Complete Complete Complete  Medication Review [employer]) Complete Referral to Pharmacy -  PCP or Specialist appointment within 3-5 days of discharge Complete Complete Complete  HRI or Home Care Consult - Complete Complete  SW Recovery Care/Counseling Consult - Complete Complete  Palliative Care Screening Not Applicable Not Applicable Not Applicable  Skilled Nursing Facility Complete Not Applicable Not Applicable  Some recent data might be hidden

## 2019-11-05 ENCOUNTER — Ambulatory Visit: Payer: Medicaid Other | Admitting: Family

## 2019-11-05 DIAGNOSIS — K652 Spontaneous bacterial peritonitis: Secondary | ICD-10-CM | POA: Diagnosis not present

## 2019-11-05 LAB — CBC WITH DIFFERENTIAL/PLATELET
Abs Immature Granulocytes: 0.04 10*3/uL (ref 0.00–0.07)
Basophils Absolute: 0.1 10*3/uL (ref 0.0–0.1)
Basophils Relative: 1 %
Eosinophils Absolute: 0.2 10*3/uL (ref 0.0–0.5)
Eosinophils Relative: 2 %
HCT: 26 % — ABNORMAL LOW (ref 36.0–46.0)
Hemoglobin: 8.7 g/dL — ABNORMAL LOW (ref 12.0–15.0)
Immature Granulocytes: 1 %
Lymphocytes Relative: 10 %
Lymphs Abs: 0.7 10*3/uL (ref 0.7–4.0)
MCH: 23.4 pg — ABNORMAL LOW (ref 26.0–34.0)
MCHC: 33.5 g/dL (ref 30.0–36.0)
MCV: 69.9 fL — ABNORMAL LOW (ref 80.0–100.0)
Monocytes Absolute: 1.3 10*3/uL — ABNORMAL HIGH (ref 0.1–1.0)
Monocytes Relative: 19 %
Neutro Abs: 4.8 10*3/uL (ref 1.7–7.7)
Neutrophils Relative %: 67 %
Platelets: 372 10*3/uL (ref 150–400)
RBC: 3.72 MIL/uL — ABNORMAL LOW (ref 3.87–5.11)
RDW: 20.9 % — ABNORMAL HIGH (ref 11.5–15.5)
Smear Review: NORMAL
WBC: 7.1 10*3/uL (ref 4.0–10.5)
nRBC: 0 % (ref 0.0–0.2)

## 2019-11-05 LAB — BASIC METABOLIC PANEL
Anion gap: 9 (ref 5–15)
BUN: 37 mg/dL — ABNORMAL HIGH (ref 8–23)
CO2: 29 mmol/L (ref 22–32)
Calcium: 8.6 mg/dL — ABNORMAL LOW (ref 8.9–10.3)
Chloride: 97 mmol/L — ABNORMAL LOW (ref 98–111)
Creatinine, Ser: 2.21 mg/dL — ABNORMAL HIGH (ref 0.44–1.00)
GFR, Estimated: 24 mL/min — ABNORMAL LOW (ref >60)
Glucose, Bld: 141 mg/dL — ABNORMAL HIGH (ref 70–99)
Potassium: 3.3 mmol/L — ABNORMAL LOW (ref 3.5–5.1)
Sodium: 135 mmol/L (ref 135–145)

## 2019-11-05 LAB — GLUCOSE, CAPILLARY: Glucose-Capillary: 98 mg/dL (ref 70–99)

## 2019-11-05 MED ORDER — MIDODRINE HCL 5 MG PO TABS
5.0000 mg | ORAL_TABLET | Freq: Three times a day (TID) | ORAL | Status: DC
  Administered 2019-11-05: 5 mg via ORAL
  Filled 2019-11-05: qty 1

## 2019-11-05 MED ORDER — MIDODRINE HCL 5 MG PO TABS
10.0000 mg | ORAL_TABLET | Freq: Two times a day (BID) | ORAL | Status: DC
  Administered 2019-11-06 – 2019-11-10 (×9): 10 mg via ORAL
  Filled 2019-11-05 (×9): qty 2

## 2019-11-05 MED ORDER — POTASSIUM CHLORIDE CRYS ER 20 MEQ PO TBCR
40.0000 meq | EXTENDED_RELEASE_TABLET | Freq: Once | ORAL | Status: AC
  Administered 2019-11-05: 40 meq via ORAL
  Filled 2019-11-05: qty 2

## 2019-11-05 NOTE — Progress Notes (Signed)
[facility]  ROUNDING NOTE   Subjective:   [name] admitted on 10/19/2019 for Peripheral edema [R60.9] Acute CHF (congestive heart failure) ([place]) [I50.9] AKI (acute kidney injury) ([place]) [N17.9] Congestive heart failure, unspecified HF chronicity, unspecified heart failure type ([place]) [Y18.5] Acute diastolic (congestive) heart failure ([place]) [I50.31]  Patient found sitting up in a recliner chair, reports feeling better today.  She is on room air, denies shortness of breath.  She was not able to tolerate ultrafiltration with dialysis yesterday, due to hypotension.     Objective:  Vital signs in last 24 hours:  Temp:  [97.4 F (36.3 C)-98.8 F (37.1 C)] 98.8 F (37.1 C) (10/27 2322) Pulse Rate:  [98-99] 98 (10/27 2322) Resp:  [18-19] 18 (10/27 2322) BP: (82-98)/(60-74) 94/74 (10/27 2322) SpO2:  [100 %] 100 % (10/27 2322) Weight:  [95.7 kg] 95.7 kg (10/28 0236)  Weight change:  Filed Weights   11/02/19 0620 11/03/19 0410 11/05/19 0236  Weight: 97.8 kg 95.4 kg 95.7 kg    Intake/Output: I/O last 3 completed shifts: In: 480 [P.O.:480] Out: 303 [Urine:2; Other:300; Stool:1]   Intake/Output this shift:  Total I/O In: 40 [P.O.:40] Out: -   Physical Exam: General:  Sitting up, appears in no acute distress  Head  oral mucous membranes moist  Lungs:   Lungs diminished at the bases, normal respiratory effort  Heart:  Regular rate and rhythm  Abdomen:   Nondistended  Extremities:  2+ lower extremity edema  Neurologic:  Oriented x3, speech clear and appropriate  Skin: No acute lesions or rashes  Rt IJ catheter  Basic Metabolic Panel: Recent Labs  Lab 10/31/19 0446 10/31/19 0446 11/01/19 0633 11/01/19 0633 11/02/19 0924 11/04/19 0454 11/04/19 1109 11/05/19 0516  NA 137  --  136  --  130* 134*  --  135  K 3.6  --  3.4*  --  3.2* 3.3*  --  3.3*  CL 100  --  96*  --  92* 95*  --  97*  CO2 27  --  29  --  27 29  --  29  GLUCOSE 134*  --  109*  --   132* 118*  --  141*  BUN 77*  --  72*  --  71* 64*  --  37*  CREATININE 3.97*  --  3.50*  --  3.67* 3.24*  --  2.21*  CALCIUM 9.3   < > 9.2   < > 8.3* 8.9  --  8.6*  MG  --   --   --   --  1.7  --   --   --   PHOS  --   --   --   --   --   --  2.9  --    < > = values in this interval not displayed.    Liver Function Tests: No results for input(s): AST, ALT, ALKPHOS, BILITOT, PROT, ALBUMIN in the last 168 hours. No results for input(s): LIPASE, AMYLASE in the last 168 hours. Recent Labs  Lab 11/04/19 0454  AMMONIA 27    CBC: Recent Labs  Lab 11/01/19 0633 11/02/19 0924 11/03/19 0628 11/04/19 0454 11/05/19 0516  WBC 9.7 12.9* 11.4* 8.8 7.1  NEUTROABS 7.8* 10.6* 9.0* 6.6 4.8  HGB 11.9* 10.6* 8.9* 9.4* 8.7*  HCT 36.1 31.0* 26.7* 28.4* 26.0*  MCV 71.1* 69.7* 70.6* 70.0* 69.9*  PLT 295 296 313 386 372    Cardiac Enzymes: No results for input(s): CKTOTAL,  CKMB, CKMBINDEX, TROPONINI in the last 168 hours.  BNP: Invalid input(s): POCBNP  CBG: Recent Labs  Lab 11/05/19 2423  NTIRWE 31    Microbiology: Results for orders placed or performed during the hospital encounter of 10/19/19  Respiratory Panel by RT PCR (Flu A&B, Covid) - Nasopharyngeal Swab     Status: None   Collection Time: 10/19/19 11:13 PM   Specimen: Nasopharyngeal Swab  Result Value Ref Range Status   SARS Coronavirus 2 by RT PCR NEGATIVE NEGATIVE Final    Comment: (NOTE) SARS-CoV-2 target nucleic acids are NOT DETECTED.  The SARS-CoV-2 RNA is generally detectable in upper respiratoy specimens during the acute phase of infection. The lowest concentration of SARS-CoV-2 viral copies this assay can detect is 131 copies/mL. A negative result does not preclude SARS-Cov-2 infection and should not be used as the sole basis for treatment or other patient management decisions. A negative result may occur with  improper specimen collection/handling, submission of specimen other than nasopharyngeal swab,  presence of viral mutation(s) within the areas targeted by this assay, and inadequate number of viral copies (<131 copies/mL). A negative result must be combined with clinical observations, patient history, and epidemiological information. The expected result is Negative.  Fact Sheet for Patients:  PinkCheek.be  Fact Sheet for Healthcare Providers:  GravelBags.it  This test is no t yet approved or cleared by the [place] FDA and  has been authorized for detection and/or diagnosis of SARS-CoV-2 by FDA under an Emergency Use Authorization (EUA). This EUA will remain  in effect (meaning this test can be used) for the duration of the COVID-19 declaration under Section 564(b)(1) of the Act, 21 U.S.C. section 360bbb-3(b)(1), unless the authorization is terminated or revoked sooner.     Influenza A by PCR NEGATIVE NEGATIVE Final   Influenza B by PCR NEGATIVE NEGATIVE Final    Comment: (NOTE) The Xpert Xpress SARS-CoV-2/FLU/RSV assay is intended as an aid in  the diagnosis of influenza from Nasopharyngeal swab specimens and  should not be used as a sole basis for treatment. Nasal washings and  aspirates are unacceptable for Xpert Xpress SARS-CoV-2/FLU/RSV  testing.  Fact Sheet for Patients: PinkCheek.be  Fact Sheet for Healthcare Providers: GravelBags.it  This test is not yet approved or cleared by the [place] FDA and  has been authorized for detection and/or diagnosis of SARS-CoV-2 by  FDA under an Emergency Use Authorization (EUA). This EUA will remain  in effect (meaning this test can be used) for the duration of the  Covid-19 declaration under Section 564(b)(1) of the Act, 21  U.S.C. section 360bbb-3(b)(1), unless the authorization is  terminated or revoked. Performed at [facility], [address] [place], [place] [address]   MRSA PCR  Screening     Status: None   Collection Time: 10/25/19  2:50 PM   Specimen: Nasopharyngeal  Result Value Ref Range Status   MRSA by PCR NEGATIVE NEGATIVE Final    Comment:        The GeneXpert MRSA Assay (FDA approved for NASAL specimens only), is one component of a comprehensive MRSA colonization surveillance program. It is not intended to diagnose MRSA infection nor to guide or monitor treatment for MRSA infections. Performed at [facility], [address] [place], [place] [address]   CULTURE, BLOOD (ROUTINE X 2) w Reflex to ID Panel     Status: None   Collection Time: 10/25/19  4:29 PM   Specimen: BLOOD  Result Value Ref Range Status  Specimen Description BLOOD CENTRAL LINE  Final   Special Requests   Final    BOTTLES DRAWN AEROBIC AND ANAEROBIC Blood Culture adequate volume   Culture   Final    NO GROWTH 5 DAYS Performed at [facility], [address] [place], [place] [address]    Report Status 10/30/2019 FINAL  Final  CULTURE, BLOOD (ROUTINE X 2) w Reflex to ID Panel     Status: None   Collection Time: 10/26/19  6:45 AM   Specimen: BLOOD  Result Value Ref Range Status   Specimen Description BLOOD BLOOD LEFT HAND  Final   Special Requests   Final    BOTTLES DRAWN AEROBIC AND ANAEROBIC Blood Culture adequate volume   Culture   Final    NO GROWTH 5 DAYS Performed at [facility], [address] [place], [place] [address]    Report Status 10/31/2019 FINAL  Final  Body fluid culture     Status: None   Collection Time: 10/26/19 11:30 AM   Specimen: Fluid  Result Value Ref Range Status   Specimen Description   Final    FLUID Performed at [facility], [address] [place], [place] [address]    Special Requests   Final    PERITONEAL Performed at [facility], [address] [place], [place] [address]    Gram Stain   Final    ABUNDANT WBC PRESENT, PREDOMINANTLY PMN NO ORGANISMS SEEN    Culture   Final     NO GROWTH Performed at [facility], [place] [address] [place], [place] [address]    Report Status 10/30/2019 FINAL  Final  Body fluid culture     Status: None   Collection Time: 10/29/19  1:39 PM   Specimen: PATH Cytology Peritoneal fluid  Result Value Ref Range Status   Specimen Description   Final    PERITONEAL Performed at [facility], [address] [place], [place] [address]    Special Requests   Final    NONE Performed at [facility], [address] [place], [place] [address]    Gram Stain   Final    MODERATE WBC PRESENT, PREDOMINANTLY PMN NO ORGANISMS SEEN    Culture   Final    NO GROWTH 3 DAYS Performed at [facility], [place] [address] [place], [place] [address]    Report Status 11/02/2019 FINAL  Final  Gram stain     Status: None   Collection Time: 11/02/19  3:30 PM   Specimen: PATH Cytology Peritoneal fluid  Result Value Ref Range Status   Specimen Description PERITONEAL  Final   Special Requests NONE  Final   Gram Stain   Final    WBC SEEN RED BLOOD CELLS NO ORGANISMS SEEN Performed at [facility], [address], [place], [place] [address]    Report Status 11/02/2019 FINAL  Final    Coagulation Studies: No results for input(s): LABPROT, INR in the last 72 hours.  Urinalysis: No results for input(s): COLORURINE, LABSPEC, PHURINE, GLUCOSEU, HGBUR, BILIRUBINUR, KETONESUR, PROTEINUR, UROBILINOGEN, NITRITE, LEUKOCYTESUR in the last 72 hours.  Invalid input(s): APPERANCEUR    Imaging: No results found.   Medications:   . sodium chloride Stopped (11/01/19 1131)  . cefTRIAXone (ROCEPHIN)  IV 2 g (11/05/19 1057)   . apixaban  5 mg Oral BID  . Chlorhexidine Gluconate Cloth  6 each Topical Daily  . feeding supplement (NEPRO CARB STEADY)  237  mL Oral Q24H  . furosemide  60 mg Intravenous BID  . iron polysaccharides  150 mg Oral Daily  . mouth rinse  15 mL Mouth Rinse BID  . melatonin  2.5 mg Oral QHS  .  pantoprazole  40 mg Oral BID  . sodium chloride flush  3 mL Intravenous Q12H   sodium chloride, acetaminophen, albuterol, alum & mag hydroxide-simeth **AND** lidocaine, bisacodyl, calcium carbonate, Glycerin (Adult), loperamide, midodrine, ondansetron (ZOFRAN) IV, ondansetron (ZOFRAN) IV, oxyCODONE-acetaminophen, sodium chloride flush  Assessment/ Plan:  Ms. [place] Kerekes is a 65 y.o. black female with congestive heart failure, hypertension, hyperlipidemia, gout, COPD, anemia, cocaine abuse who is admitted to [facility] on 10/19/2019 for Peripheral edema [R60.9] Acute CHF (congestive heart failure) ([place]) [I50.9] AKI (acute kidney injury) ([place]) [N17.9] Congestive heart failure, unspecified HF chronicity, unspecified heart failure type (HCC) [E33.2] Acute diastolic (congestive) heart failure ([place]) [I50.31]  1. Acute kidney injury on chronic kidney disease stage IIIB. Baseline creatinine of 1.66, GFR of 32. With history of proteinuria.  Chronic kidney disease secondary to hypertension  Acute renal failure secondary to acute cardiorenal syndrome   Lab Results  Component Value Date   CREATININE 2.21 (H) 11/05/2019   CREATININE 3.24 (H) 11/04/2019   CREATININE 3.67 (H) 11/02/2019  Patient received dialysis treatment yesterday, was unable to tolerate ultrafiltration, due to hypotension She reports she is voiding normally We will hold off on dialysis today Strict intake output monitoring   2. Hypotension with acute exacerbation of systolic and diastolic congestive heart failure. Echocardiogram 06/22/19 EF of 25-30%. Patient appears in no acute respiratory distress Respiration unlabored, on room air, uses her nasal cannula as needed We will increase midodrine  as she stays hypotensive  4. Anemia of Chronic Disease  Hgb at goal, 8.7 today      LOS: 17 [place] Culbreth 10/28/202112:32 PM

## 2019-11-05 NOTE — Progress Notes (Signed)
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 Found to have BNP greater than 4500, elevated troponin and AKI.  Multiple prior admissions with similar symptoms and complaints. Prolonged hospitalization in August 2021 when she was discharged to SNF. Not clear when she get out of SNF. Continues to complain that she is homeless but per social worker she do have a house. Not take her medication despite being provided with medical management.  After admission to the hospital, she was diagnosed with acute on chronic combinedsystolicanddiastoliccongestiveheartfailurewith ejection fraction 25 to 30%. She was not responding to IV Lasix. She was seen by nephrology, temporary dialysis catheter was performed and she was dialyzed on 10/17 and 10/18. Patient also complaining of abdominal pain, she had a paracentesis, study showed possible spontaneous peritonitis, she was treated with Rocephin and Flagyl.  Patient had a worsening respiratory status on 10/17, was diagnosed with aspiration pneumonia. She was treated with Rocephin, Zithromax and Flagyl. Patient also had abdominal pain and a CT scan suspect ischemia, but seen by general surgery on 10/19, no need for surgery.  10/21.Repeat paracentesis performed, antibiotic changed to Rocephin only. Permacath placed.  10/22.Repeated paracentesis showed white cell count of 3048, 69% neutrophils. However, patient clinically improving. Continue Rocephin  10/26.  Repeated paracentesis showed white cell count of 1636.   10/27: The patient was unable to  tolerate ultrafiltration due to hypotension.  Consultants  . Gastroenterology . Psychiatry . General Surgery . Vascular surgery . Cardiology . Nephrology  Procedures  . Paracentesis x 3 . Placement of temporary dialysis catheter. . Dialysis  Antibiotics   Anti-infectives (From admission, onward)   Start     Dose/Rate Route Frequency Ordered Stop   11/01/19 1000  cefTRIAXone (ROCEPHIN) 2 g in sodium chloride 0.9 % 100 mL IVPB        2 g 200 mL/hr over 30 Minutes Intravenous Daily 10/31/19 1521     10/27/19 1000  cefTRIAXone (ROCEPHIN) 2 g in sodium chloride 0.9 % 100 mL IVPB  Status:  Discontinued        2 g 200 mL/hr over 30 Minutes Intravenous Daily 10/26/19 1559 10/31/19 1521   10/26/19 2000  cefTRIAXone (ROCEPHIN) 1 g in sodium chloride 0.9 % 100 mL IVPB        1 g 200 mL/hr over 30 Minutes Intravenous  Once 10/26/19 1559 10/26/19 2024   10/26/19 1600  cefTRIAXone (ROCEPHIN) 2 g in sodium chloride 0.9 % 100 mL IVPB  Status:  Discontinued        2 g 200 mL/hr over 30 Minutes Intravenous Every 24 hours 10/26/19 1351 10/26/19 1600   10/26/19 1000  metroNIDAZOLE (FLAGYL) IVPB 500 mg  Status:  Discontinued        500 mg 100 mL/hr over 60 Minutes Intravenous Every 8 hours 10/26/19 0909 10/29/19 1138   10/25/19 1445  azithromycin (ZITHROMAX) 500 mg in sodium chloride 0.9 % 250 mL IVPB  Status:  Discontinued        500 mg 250 mL/hr over 60 Minutes Intravenous Every 24 hours 10/25/19 1353 10/29/19 1138   10/25/19 1230  azithromycin (ZITHROMAX) tablet 500 mg  Status:  Discontinued  500 mg Oral Daily 10/25/19 1133 10/25/19 1134   10/25/19 1030  cefTRIAXone (ROCEPHIN) 1 g in sodium chloride 0.9 % 100 mL IVPB  Status:  Discontinued        1 g 200 mL/hr over 30 Minutes Intravenous Every 12 hours 10/25/19 0931 10/26/19 1351     Subjective  The patient is resting quietly. No new complaints.  Objective   Vitals:  Vitals:   11/04/19 1529 11/04/19 2322  BP: 94/63 94/74   Pulse: 98 98  Resp: 19 18  Temp: (!) 97.4 F (36.3 C) 98.8 F (37.1 C)  SpO2: 100% 100%   Exam:  Constitutional:  . The patient is awake, alert, and oriented x 3. No acute distress. Respiratory:  . No increased work of breathing. . No wheezes, rales, or rhonchi . No tactile fremitus Cardiovascular:  . Regular rate and rhythm . No murmurs, ectopy, or gallups. . No lateral PMI. No thrills. Abdomen:  . Abdomen is soft, non-tender, non-distended . No hernias, masses, or organomegaly . Normoactive bowel sounds.  Musculoskeletal:  . No cyanosis, clubbing, or edema Skin:  . No rashes, lesions, ulcers . palpation of skin: no induration or nodules Neurologic:  . CN 2-12 intact . Sensation all 4 extremities intact Psychiatric:  . Mental status o Mood, affect appropriate o Orientation to person, place, time  . judgment and insight appear intact I have personally reviewed the following:   Today's Data  . Vitals, BMP, CBC  Lab Data  . Pathology review of peritoneal fluid with cytospin. No malignant cells. Abundant inflammation. 1342 neutrophils.  Micro Data  . Peritoneal fluid: No growth.  Scheduled Meds: . apixaban  5 mg Oral BID  . Chlorhexidine Gluconate Cloth  6 each Topical Daily  . feeding supplement (NEPRO CARB STEADY)  237 mL Oral Q24H  . furosemide  60 mg Intravenous BID  . iron polysaccharides  150 mg Oral Daily  . mouth rinse  15 mL Mouth Rinse BID  . melatonin  2.5 mg Oral QHS  . midodrine  5 mg Oral TID WC  . pantoprazole  40 mg Oral BID  . sodium chloride flush  3 mL Intravenous Q12H   Continuous Infusions: . sodium chloride Stopped (11/01/19 1131)  . cefTRIAXone (ROCEPHIN)  IV 2 g (11/05/19 1057)    Principal Problem:   Spontaneous bacterial peritonitis ([place]) Active Problems:   AKI (acute kidney injury) ([place])   Crack cocaine use   Acute respiratory failure with hypoxia (HCC)   LV (left ventricular) mural thrombus   Nonsustained ventricular  tachycardia (HCC)   Acute CHF (congestive heart failure) (HCC)   Cocaine abuse ([place])   Peripheral edema   Cirrhosis of liver (HCC)   Dementia (HCC)   Aspiration pneumonia of both lower lobes due to gastric secretions (HCC)   Acute diastolic (congestive) heart failure ([place])   LOS: 17 days   A & P  Spontaneous bacterial peritonitis: Repeat paracentesis showed significant decrease of white cell in ascites, antibiotics effective. Hoever she still has a lot of neutrophils in 11/03/2019 fluid.  Culture has been negative.  Continue Rocephin.  Acute on chronic combined systolic and diastolic congestive heart failure. Acute hypoxemic respiratory failure. Positive for continued rales at bases bilaterally.  Will discuss with nephrology for possible ultrafiltration.  Patient has not been responding to IV Lasix.  Liver cirrhosis with ascites: Noted. Continue to follow.  Acute renal failure on chronic kidney disease stage IIIb. Renal function appears to be improving.  Patient still has permacath placed. Due to patient poor response to diuretics, she may need scheduled ultrafiltration.  However, due to patient multidrug abuse, if not may not be safe to leave a permacath.   Left ventricular thrombus with history of CVA. Patient was initially concerned for possible GI bleed, anticoagulation was on hold.  However, there is no GI bleed observed while in the hospital.  GI was not concerned about it.  It is not safe to continue hold anticoagulation.  At this point, I restarted.  Monitor hemoglobin.  Overgrown toenails: The patient is complaining of badly over grown toenails. I have explained to her that podiatrists are unable to clut toenails as inpatient. Nurses aren't able to cut them. The patient states that every time she has an appointment to see podiatry for them as outpatient, she is inpatient.   Anemia of chronic disease: Hemoglobin still stable.  Patient had a normal B12 level recently, but she  also has mild iron deficiency.  Oral supplement.  Hypokalemia: Supplement and monitor.  I have seen and examined this patient myself. I have spent 34 minutes in her evaluation and care.  DVT prophylaxis: Eliquis Code Status: Full Family Communication: None available Disposition Plan:   Status is: Inpatient  Remains inpatient appropriate because:Inpatient level of care appropriate due to severity of illness   Dispo: The patient is from: Home  Anticipated d/c is to: SNF. Patient has been approved for nursing home placement.  Has a bed available, but is still requiring dialysis.  Anticipated d/c date is: 2 days  Patient currently is not medically stable to d/c.  Has a bed available, but is still requiring dialysis.  Jashira Cotugno, DO [facility] Direct contact: see [email or web address]  7PM-7AM contact night coverage as above 11/05/2019, 1:05 PM  LOS: 16 days

## 2019-11-05 NOTE — TOC Progression Note (Signed)
Transition of Care [facility]) - Progression Note    Patient Details  Name: Katelyn Lane MRN: [id number] Date of Birth: February 27, 1955  Transition of Care [facility] [place]) CM/SW [place], RN Phone Number: 11/05/2019, 8:36 AM  Clinical Narrative:   RNCM reached out to [facility] with [facility], she reported that she had not yet received insurance authorization but that she will check again very shortly and return call if it is received.          Expected Discharge Plan and Services                                                 Social Determinants of Health (SDOH) Interventions    Readmission Risk Interventions Readmission Risk Prevention Plan 09/02/2019 08/14/2019 07/27/2019  Transportation Screening Complete Complete Complete  Medication Review Press photographer) Complete Referral to Pharmacy -  PCP or Specialist appointment within 3-5 days of discharge Complete Complete Complete  HRI or Home Care Consult - Complete Complete  SW Recovery Care/Counseling Consult - Complete Complete  Palliative Care Screening Not Applicable Not Applicable Not Applicable  Skilled Nursing Facility Complete Not Applicable Not Applicable  Some recent data might be hidden

## 2019-11-05 NOTE — Evaluation (Signed)
Physical Therapy Evaluation Patient Details Name: Katelyn Lane MRN: [id number] DOB: 02-Jun-1955 Today's Date: 11/05/2019   History of Present Illness  presented to ER secondary to LE edema, progressive SOB; admitted for management of acute/chronic CHF, spontaneous bacterial peritonitis (s/p paracentesis 10/25).  Hospital course also significant for initiation of dialysis (initially, R temp fem cath; converted to R permcath)  Clinical Impression  Patient spontaneously requesting/demanding OOB/gait efforts with CNA; PT present to assist. Patient remains generally agitated and impulsive with behavior and mobility; responds and participates best when session/activities initiated on her terms.  Generally weak and deconditioned due to chronic health conditions, extended hospitalization; no focal weakness appreciated.  Able to complete sit/stand, basic transfers and gait (150') with RW, min assist.  Demonstrates forward flexed posture with decreased heel strike/toe off bilat (flat foot contact), mildly excessive bilat LE ER throughout gait cycle; cuing for walker position and postural extension, but limtied integration. Fair cadence; limited insight/awareness into need for activity pacing and energy conservation. Mod SOB with activity-difficulty measuring O2 sats (poor signal), 90% once read.  Will continue to assess/progress in subsequent sessions as patient allows. Does voice goal of wanting to go outside; may plan to integrate into subsequent sessions if appropriate. Would benefit from skilled PT to address above deficits and promote optimal return to PLOF.; recommend transition to STR upon discharge from acute hospitalization.     Follow Up Recommendations SNF    Equipment Recommendations  Rolling walker with 5" wheels;3in1 (PT)    Recommendations for Other Services       Precautions / Restrictions Precautions Precautions: Fall Restrictions Weight Bearing Restrictions: No      Mobility   Bed Mobility               General bed mobility comments: seated edge of bed upon arrival to room    Transfers Overall transfer level: Needs assistance Equipment used: Rolling walker (2 wheeled) Transfers: Sit to/from Stand Sit to Stand: Min assist         General transfer comment: slightly impulsive, pulling on RW during movement transition  Ambulation/Gait Ambulation/Gait assistance: Min assist Gait Distance (Feet): 150 Feet Assistive device: Rolling walker (2 wheeled)       General Gait Details: forward flexed posture with decreased heel strike/toe off bilat (flat foot contact), mildly excessive bilat LE ER throughout gait cycle; cuing for walker position and postural extension, but limtied integration. Fair cadence; limited insight/awareness into need for activity pacing and energy conservation. Mod SOB with activity-difficulty measuring O2 sats (poor signal), 90% once read.  Stairs            Wheelchair Mobility    Modified Rankin (Stroke Patients Only)       Balance Overall balance assessment: Needs assistance Sitting-balance support: Feet supported;No upper extremity supported Sitting balance-Leahy Scale: Good     Standing balance support: Bilateral upper extremity supported Standing balance-Leahy Scale: Fair Standing balance comment: relies on RW and +1 for support/safety with standing/mobility                             Pertinent Vitals/Pain Pain Assessment: Faces Faces Pain Scale: Hurts even more Pain Location: buttocks Pain Descriptors / Indicators: Grimacing Pain Intervention(s): Limited activity within patient's tolerance;Monitored during session;Repositioned    Home Living                   Additional Comments: Patient unwilling to engage with conversation/interview about prior level  of functional and social history    Prior Function Level of Independence: Independent         Comments: Per chart and patient,  indep with mobility and ADLs; has RW, does not use.     Hand Dominance        Extremity/Trunk Assessment   Upper Extremity Assessment Upper Extremity Assessment: Generalized weakness (grossly at least 4-/5 throughout)    Lower Extremity Assessment Lower Extremity Assessment: Generalized weakness (grossly at least 4-/5 throughout; supports body weight without buckling; no obvious focal weakness)       Communication   Communication: No difficulties  Cognition Arousal/Alertness: Awake/alert Behavior During Therapy: Anxious                                   General Comments: limited cooperation with interview/assessment; oriented to self, location; very volatile behaviors, easily agitated at times.  Optimal performance/participation when she is able to dictate schedule, care and goals.      General Comments      Exercises     Assessment/Plan    PT Assessment Patient needs continued PT services  PT Problem List Decreased strength;Decreased activity tolerance;Decreased balance;Decreased mobility;Decreased cognition;Decreased knowledge of use of DME;Decreased safety awareness;Cardiopulmonary status limiting activity;Decreased knowledge of precautions       PT Treatment Interventions DME instruction;Gait training;Stair training;Functional mobility training;Therapeutic activities;Therapeutic exercise;Balance training;Patient/family education;Cognitive remediation    PT Goals (Current goals can be found in the Care Plan section)  Acute Rehab PT Goals Patient Stated Goal: to go outside PT Goal Formulation: With patient Time For Goal Achievement: 11/19/19 Potential to Achieve Goals: Fair    Frequency Min 2X/week   Barriers to discharge Decreased caregiver support      Co-evaluation               AM-PAC PT "6 Clicks" Mobility  Outcome Measure Help needed turning from your back to your side while in a flat bed without using bedrails?: None Help  needed moving from lying on your back to sitting on the side of a flat bed without using bedrails?: None Help needed moving to and from a bed to a chair (including a wheelchair)?: A Little Help needed standing up from a chair using your arms (e.g., wheelchair or bedside chair)?: A Little Help needed to walk in hospital room?: A Little Help needed climbing 3-5 steps with a railing? : A Little 6 Click Score: 20    End of Session Equipment Utilized During Treatment: Gait belt Activity Tolerance: Patient tolerated treatment well;Patient limited by fatigue Patient left: in chair;with call bell/phone within reach;with chair alarm set;with nursing/sitter in room Nurse Communication: Mobility status PT Visit Diagnosis: Muscle weakness (generalized) (M62.81);Difficulty in walking, not elsewhere classified (R26.2)    Time: 5809-9833 PT Time Calculation (min) (ACUTE ONLY): 12 min   Charges:   PT Evaluation $PT Eval Low Complexity: 1 Low          Arlanda Shiplett H. Owens Shark, PT, DPT, NCS 11/05/19, 3:06 PM [phone]

## 2019-11-06 DIAGNOSIS — I1310 Hypertensive heart and chronic kidney disease without heart failure, with stage 1 through stage 4 chronic kidney disease, or unspecified chronic kidney disease: Secondary | ICD-10-CM | POA: Diagnosis not present

## 2019-11-06 DIAGNOSIS — Z515 Encounter for palliative care: Secondary | ICD-10-CM | POA: Diagnosis not present

## 2019-11-06 DIAGNOSIS — K652 Spontaneous bacterial peritonitis: Secondary | ICD-10-CM | POA: Diagnosis not present

## 2019-11-06 DIAGNOSIS — Z7189 Other specified counseling: Secondary | ICD-10-CM | POA: Diagnosis not present

## 2019-11-06 LAB — CBC WITH DIFFERENTIAL/PLATELET
Abs Immature Granulocytes: 0.04 10*3/uL (ref 0.00–0.07)
Basophils Absolute: 0.1 10*3/uL (ref 0.0–0.1)
Basophils Relative: 1 %
Eosinophils Absolute: 0.1 10*3/uL (ref 0.0–0.5)
Eosinophils Relative: 2 %
HCT: 26 % — ABNORMAL LOW (ref 36.0–46.0)
Hemoglobin: 8.4 g/dL — ABNORMAL LOW (ref 12.0–15.0)
Immature Granulocytes: 1 %
Lymphocytes Relative: 13 %
Lymphs Abs: 1 10*3/uL (ref 0.7–4.0)
MCH: 23 pg — ABNORMAL LOW (ref 26.0–34.0)
MCHC: 32.3 g/dL (ref 30.0–36.0)
MCV: 71.2 fL — ABNORMAL LOW (ref 80.0–100.0)
Monocytes Absolute: 1.6 10*3/uL — ABNORMAL HIGH (ref 0.1–1.0)
Monocytes Relative: 21 %
Neutro Abs: 4.8 10*3/uL (ref 1.7–7.7)
Neutrophils Relative %: 62 %
Platelets: 436 10*3/uL — ABNORMAL HIGH (ref 150–400)
RBC: 3.65 MIL/uL — ABNORMAL LOW (ref 3.87–5.11)
RDW: 20.3 % — ABNORMAL HIGH (ref 11.5–15.5)
WBC: 7.6 10*3/uL (ref 4.0–10.5)
nRBC: 0 % (ref 0.0–0.2)

## 2019-11-06 LAB — BASIC METABOLIC PANEL
Anion gap: 10 (ref 5–15)
BUN: 38 mg/dL — ABNORMAL HIGH (ref 8–23)
CO2: 28 mmol/L (ref 22–32)
Calcium: 8.7 mg/dL — ABNORMAL LOW (ref 8.9–10.3)
Chloride: 95 mmol/L — ABNORMAL LOW (ref 98–111)
Creatinine, Ser: 2.48 mg/dL — ABNORMAL HIGH (ref 0.44–1.00)
GFR, Estimated: 21 mL/min — ABNORMAL LOW (ref >60)
Glucose, Bld: 100 mg/dL — ABNORMAL HIGH (ref 70–99)
Potassium: 3.8 mmol/L (ref 3.5–5.1)
Sodium: 133 mmol/L — ABNORMAL LOW (ref 135–145)

## 2019-11-06 LAB — PHOSPHORUS: Phosphorus: 2.9 mg/dL (ref 2.5–4.6)

## 2019-11-06 MED ORDER — ALBUMIN HUMAN 25 % IV SOLN
25.0000 g | INTRAVENOUS | Status: DC | PRN
  Administered 2019-11-06: 25 g via INTRAVENOUS
  Filled 2019-11-06: qty 100

## 2019-11-06 NOTE — TOC Progression Note (Signed)
Transition of Care [facility]) - Progression Note    Patient Details  Name: Katelyn Lane MRN: [id number] Date of Birth: 06-27-1955  Transition of Care [facility]) CM/SW Lynn, RN Phone Number: 11/06/2019, 4:10 PM  Clinical Narrative:  No insurance authorization received as of this time. Per Elvera Bicker patient will not be able to be set up with outpatient dialysis at this time due to insurance and will need to be transported to ED three times weekly for  dialysis.          Expected Discharge Plan and Services                                                 Social Determinants of Health (SDOH) Interventions    Readmission Risk Interventions Readmission Risk Prevention Plan 09/02/2019 08/14/2019 07/27/2019  Transportation Screening Complete Complete Complete  Medication Review Press photographer) Complete Referral to Pharmacy -  PCP or Specialist appointment within 3-5 days of discharge Complete Complete Complete  HRI or Home Care Consult - Complete Complete  SW Recovery Care/Counseling Consult - Complete Complete  Palliative Care Screening Not Applicable Not Applicable Not Applicable  Skilled Nursing Facility Complete Not Applicable Not Applicable  Some recent data might be hidden

## 2019-11-06 NOTE — Progress Notes (Signed)
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 Found to have BNP greater than 4500, elevated troponin and AKI.  Multiple prior admissions with similar symptoms and complaints. Prolonged hospitalization in August 2021 when she was discharged to SNF. Not clear when she get out of SNF. Continues to complain that she is homeless but per social worker she do have a house. Not take her medication despite being provided with medical management.  After admission to the hospital, she was diagnosed with acute on chronic combinedsystolicanddiastoliccongestiveheartfailurewith ejection fraction 25 to 30%. She was not responding to IV Lasix. She was seen by nephrology, temporary dialysis catheter was performed and she was dialyzed on 10/17 and 10/18. Patient also complaining of abdominal pain, she had a paracentesis, study showed possible spontaneous peritonitis, she was treated with Rocephin and Flagyl.  Patient had a worsening respiratory status on 10/17, was diagnosed with aspiration pneumonia. She was treated with Rocephin, Zithromax and Flagyl. Patient also had abdominal pain and a CT scan suspect ischemia, but seen by general surgery on 10/19, no need for surgery.  10/21.Repeat paracentesis performed, antibiotic changed to Rocephin only. Permacath placed.  10/22.Repeated paracentesis showed white cell count of 3048, 69% neutrophils. However, patient clinically improving. Continue Rocephin  10/26.  Repeated paracentesis showed white cell count of 1636.   10/27: The patient was unable to  tolerate ultrafiltration due to hypotension.  Consultants  . Gastroenterology . Psychiatry . General Surgery . Vascular surgery . Cardiology . Nephrology  Procedures  . Paracentesis x 3 . Placement of temporary dialysis catheter. . Dialysis  Antibiotics   Anti-infectives (From admission, onward)   Start     Dose/Rate Route Frequency Ordered Stop   11/01/19 1000  cefTRIAXone (ROCEPHIN) 2 g in sodium chloride 0.9 % 100 mL IVPB        2 g 200 mL/hr over 30 Minutes Intravenous Daily 10/31/19 1521     10/27/19 1000  cefTRIAXone (ROCEPHIN) 2 g in sodium chloride 0.9 % 100 mL IVPB  Status:  Discontinued        2 g 200 mL/hr over 30 Minutes Intravenous Daily 10/26/19 1559 10/31/19 1521   10/26/19 2000  cefTRIAXone (ROCEPHIN) 1 g in sodium chloride 0.9 % 100 mL IVPB        1 g 200 mL/hr over 30 Minutes Intravenous  Once 10/26/19 1559 10/26/19 2024   10/26/19 1600  cefTRIAXone (ROCEPHIN) 2 g in sodium chloride 0.9 % 100 mL IVPB  Status:  Discontinued        2 g 200 mL/hr over 30 Minutes Intravenous Every 24 hours 10/26/19 1351 10/26/19 1600   10/26/19 1000  metroNIDAZOLE (FLAGYL) IVPB 500 mg  Status:  Discontinued        500 mg 100 mL/hr over 60 Minutes Intravenous Every 8 hours 10/26/19 0909 10/29/19 1138   10/25/19 1445  azithromycin (ZITHROMAX) 500 mg in sodium chloride 0.9 % 250 mL IVPB  Status:  Discontinued        500 mg 250 mL/hr over 60 Minutes Intravenous Every 24 hours 10/25/19 1353 10/29/19 1138   10/25/19 1230  azithromycin (ZITHROMAX) tablet 500 mg  Status:  Discontinued  500 mg Oral Daily 10/25/19 1133 10/25/19 1134   10/25/19 1030  cefTRIAXone (ROCEPHIN) 1 g in sodium chloride 0.9 % 100 mL IVPB  Status:  Discontinued        1 g 200 mL/hr over 30 Minutes Intravenous Every 12 hours 10/25/19 0931 10/26/19 1351     Subjective  The patient is resting quietly. No new complaints.  Objective   Vitals:  Vitals:   11/05/19 1528 11/06/19 0020  BP: 91/66 104/87   Pulse: 100 (!) 110  Resp: 16 17  Temp: 98.4 F (36.9 C) 98.6 F (37 C)  SpO2: 100% 100%   Exam:  Constitutional:  . The patient is awake, alert, and oriented x 3. No acute distress. Respiratory:  . No increased work of breathing. . No wheezes, rales, or rhonchi . No tactile fremitus Cardiovascular:  . Regular rate and rhythm . No murmurs, ectopy, or gallups. . No lateral PMI. No thrills. Abdomen:  . Abdomen is soft, non-tender, non-distended . No hernias, masses, or organomegaly . Normoactive bowel sounds.  Musculoskeletal:  . No cyanosis, clubbing, or edema Skin:  . No rashes, lesions, ulcers . palpation of skin: no induration or nodules Neurologic:  . CN 2-12 intact . Sensation all 4 extremities intact Psychiatric:  . Mental status o Mood, affect appropriate o Orientation to person, place, time  . judgment and insight appear intact I have personally reviewed the following:   Today's Data  . Vitals, BMP, CBC  Lab Data  . Pathology review of peritoneal fluid with cytospin. No malignant cells. Abundant inflammation. 1342 neutrophils.  Micro Data  . Peritoneal fluid: No growth.  Scheduled Meds: . apixaban  5 mg Oral BID  . Chlorhexidine Gluconate Cloth  6 each Topical Daily  . feeding supplement (NEPRO CARB STEADY)  237 mL Oral Q24H  . furosemide  60 mg Intravenous BID  . iron polysaccharides  150 mg Oral Daily  . mouth rinse  15 mL Mouth Rinse BID  . melatonin  2.5 mg Oral QHS  . midodrine  10 mg Oral BID WC  . pantoprazole  40 mg Oral BID  . sodium chloride flush  3 mL Intravenous Q12H   Continuous Infusions: . sodium chloride Stopped (11/01/19 1131)  . albumin human    . cefTRIAXone (ROCEPHIN)  IV 2 g (11/05/19 1057)    Principal Problem:   Spontaneous bacterial peritonitis ([place]) Active Problems:   AKI (acute kidney injury) ([place])   Crack cocaine use   Acute respiratory failure with hypoxia ([facility])   LV (left ventricular) mural thrombus    Nonsustained ventricular tachycardia (H[facility])   Acute CHF (congestive heart failure) (H[facility])   Cocaine abuse ([place])   Peripheral edema   Cirrhosis of liver (HCC)   Dementia (HCC)   Aspiration pneumonia of both lower lobes due to gastric secretions (HCC)   Acute diastolic (congestive) heart failure ([place])   LOS: 18 days   A & P  Spontaneous bacterial peritonitis: Repeat paracentesis showed significant decrease of white cell in ascites, antibiotics effective. Hoever she still has a lot of neutrophils in 11/03/2019 fluid.  Culture has been negative. Continue Rocephin.  Acute on chronic combined systolic and diastolic congestive heart failure. Acute hypoxemic respiratory failure. Positive for continued rales at bases bilaterally.  Will discuss with nephrology for possible ultrafiltration.  Patient has not been responding to IV Lasix.  Liver cirrhosis with ascites: Noted. Continue to follow.  Acute renal failure on chronic kidney disease stage IIIb. Renal function  appears to be improving.  Patient still has permacath placed. Due to patient poor response to diuretics, she will scheduled ultrafiltration as outpatient, although she does not meet criteria for the diagnosis of End Stage renal disease.Marland Kitchen  However, due to patient polysubstance abuse, the presence of a permacath presents a danger to her well being. On the other hand the patient cannot survive without dialysis. Today in the presence of Dr. Holley Raring, the patient agreed that she would avoid illicit drug use if we left the permacath in place. I explained to her that the use of illicit substances would put her life in danger, and require the removal of the dialysis catheter. She has voiced understanding.   Left ventricular thrombus with history of CVA. Patient was initially concerned for possible GI bleed, anticoagulation was on hold.  However, there is no GI bleed observed while in the hospital.  GI was not concerned about it.  It is not safe to  continue hold anticoagulation.  Anticoagulation has now been restarted.  Monitor hemoglobin.  Overgrown toenails: The patient is complaining of badly over grown toenails. I have explained to her that podiatrists are unable to clut toenails as inpatient. Nurses aren't able to cut them. The patient states that every time she has an appointment to see podiatry for them as outpatient, she is inpatient.   Anemia of chronic disease: Hemoglobin still stable.  Patient had a normal B12 level recently, but she also has mild iron deficiency.  Oral supplement.  Hypokalemia: Supplement and monitor.  I have seen and examined this patient myself. I have spent 38 minutes in her evaluation and care.  DVT prophylaxis: Eliquis Code Status: Full Family Communication: None available Disposition Plan:   Status is: Inpatient  Remains inpatient appropriate because:Inpatient level of care appropriate due to severity of illness   Dispo: The patient is from: Home  Anticipated d/c is to: SNF. Patient has been approved for nursing home placement.  Has a bed available, but is still requiring dialysis.  Anticipated d/c date is: 2 days  Patient currently is not medically stable to d/c.    Nicandro Perrault, DO [facility] Direct contact: see [email or web address]  7PM-7AM contact night coverage as above 11/06/2019, 3:11 PM  LOS: 16 days

## 2019-11-06 NOTE — Consult Note (Signed)
Consultation Note Date: 11/06/2019   Patient Name: Katelyn Lane  DOB: 1955-01-20  MRN: [id number]  Age / Sex: 64 y.o., female  PCP: Alene Mires Elyse Jarvis, MD Referring Physician: Karie Kirks, DO  Reason for Consultation: Establishing goals of care  HPI/Patient Profile: 64 y.o. 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 Hospitalization complicated by need for dialysis and aspiration pneumonia. She did require dialysis during hospitalization but tolerated poorly with hypotension.   Clinical Assessment and Goals of Care: I met today with Katelyn Lane. She is currently in dialysis. She was asleep but I did awaken and attempt to speak with her. She awoke but during my conversation attempts her eyes were moving rapidly around room at times and seemed very difficult for her to focus. She speaks very softly and especially difficult to understand her through her mask. She became frustrated when I could not hear her and she needed to repeat herself. She told me multiple times during my attempts at conversation that she could not or would not speak with me when I ask her about what has been going on and how she is doing. However, she did read my name off my badge and told me it was pretty. I attempted to ask her about family support and she just shakes her head no. She asks how much longer she has on dialysis. She does endorse willingness to go to facility for rehab stay. Difficult to know if Katelyn Lane is confused or unwilling to speak with me (or both). She does tell me "have a blessed  day" and "thank you for checking on me" when I go to leave but this was the most verbal response I was able to obtain.  I discussed with dialysis nurse who is familiar with her. She tells me that Katelyn Lane seems more alert and oriented and conversant in the morning times and by afternoon she is more sleepy and confused.  I will reattempt conversation when I return Monday morning. I was hoping to at least elicit a surrogate decision maker from Katelyn Lane but I was unable. I see she has 2 sisters listed in emergency contacts. I would like to get more information from Katelyn Lane before speaking with family. If she discharges prior to Monday I would recommend outpatient palliative to see at SNF in morning time for further attempts at goals of care conversation - otherwise I will follow up Monday morning.   Primary Decision Maker Not entirely clear. Seems per notes that Katelyn Lane's orientation and mentation fluctuates. She did have consultation from psychiatry noting that she does not have capacity for decision making but appears her capacity has changed since this consultation according to notes and nursing report. She does have 2 sisters listed as emergency contact that would be surrogate decision  maker in the absence of further information at this time.     SUMMARY OF RECOMMENDATIONS   - Continue conversation attempts Monday morning  Code Status/Advance Care Planning:  Full code   Symptom Management:   Per attending, nephrology, cardiology, GI  Palliative Prophylaxis:   Aspiration and Delirium Protocol   Prognosis:   Very poor with multiorgan dysfunction and severe disease.   Discharge Planning: Skilled Nursing Facility for rehab with Palliative care service follow-up      Primary Diagnoses: Present on Admission: . Crack cocaine use . Acute respiratory failure with hypoxia (HCC) . LV (left ventricular) mural thrombus . Acute diastolic (congestive) heart failure (HCC)   I have reviewed the  medical record, interviewed the patient and family, and examined the patient. The following aspects are pertinent.  Past Medical History:  Diagnosis Date  . CHF (congestive heart failure) (HCC)   . COVID-19   . Hyperlipidemia   . Hypertension   . Renal disorder    Social History   Socioeconomic History  . Marital status: Single    Spouse name: Not on file  . Number of children: Not on file  . Years of education: Not on file  . Highest education level: Not on file  Occupational History  . Not on file  Tobacco Use  . Smoking status: Current Every Day Smoker  . Smokeless tobacco: Never Used  Substance and Sexual Activity  . Alcohol use: No    Alcohol/week: 0.0 standard drinks  . Drug use: Yes    Types: Cocaine    Comment: 3-4 days ago  . Sexual activity: Not on file  Other Topics Concern  . Not on file  Social History Narrative  . Not on file   Social Determinants of Health   Financial Resource Strain:   . Difficulty of Paying Living Expenses: Not on file  Food Insecurity:   . Worried About Programme researcher, broadcasting/film/video in the Last Year: Not on file  . Ran Out of Food in the Last Year: Not on file  Transportation Needs:   . Lack of Transportation (Medical): Not on file  . Lack of Transportation (Non-Medical): Not on file  Physical Activity:   . Days of Exercise per Week: Not on file  . Minutes of Exercise per Session: Not on file  Stress:   . Feeling of Stress : Not on file  Social Connections:   . Frequency of Communication with Friends and Family: Not on file  . Frequency of Social Gatherings with Friends and Family: Not on file  . Attends Religious Services: Not on file  . Active Member of Clubs or Organizations: Not on file  . Attends Banker Meetings: Not on file  . Marital Status: Not on file   Family History  Problem Relation Age of Onset  . Breast cancer Neg Hx    Scheduled Meds: . apixaban  5 mg Oral BID  . Chlorhexidine Gluconate Cloth  6 each  Topical Daily  . feeding supplement (NEPRO CARB STEADY)  237 mL Oral Q24H  . furosemide  60 mg Intravenous BID  . iron polysaccharides  150 mg Oral Daily  . mouth rinse  15 mL Mouth Rinse BID  . melatonin  2.5 mg Oral QHS  . midodrine  10 mg Oral BID WC  . pantoprazole  40 mg Oral BID  . sodium chloride flush  3 mL Intravenous Q12H   Continuous Infusions: . sodium chloride Stopped (11/01/19 1131)  .  albumin human 25 g (11/06/19 1138)  . cefTRIAXone (ROCEPHIN)  IV 2 g (11/05/19 1057)   PRN Meds:.sodium chloride, acetaminophen, albumin human, albuterol, alum & mag hydroxide-simeth **AND** lidocaine, bisacodyl, calcium carbonate, Glycerin (Adult), loperamide, ondansetron (ZOFRAN) IV, ondansetron (ZOFRAN) IV, oxyCODONE-acetaminophen, sodium chloride flush Allergies  Allergen Reactions  . Penicillins Hives, Rash and Other (See Comments)    Did it involve swelling of the face/tongue/throat, SOB, or low BP? No Did it involve sudden or severe rash/hives, skin peeling, or any reaction on the inside of your mouth or nose? Yes Did you need to seek medical attention at a hospital or doctor's office? Yes When did it last happen? >25 years If all above answers are "NO", may proceed with cephalosporin use.   . Ace Inhibitors Nausea And Vomiting   Review of Systems  Unable to perform ROS: Other  Constitutional:       Seems confused and not cooperative with conversation or answering questions. She does deny pain.     Physical Exam Vitals and nursing note reviewed.  Constitutional:      General: She is not in acute distress.    Appearance: She is ill-appearing.  Cardiovascular:     Rate and Rhythm: Normal rate.  Pulmonary:     Effort: Pulmonary effort is normal. No tachypnea, accessory muscle usage or respiratory distress.  Abdominal:     Palpations: Abdomen is soft.  Neurological:     Mental Status: She is alert.     Comments: Difficult to assess orientation as she is not  cooperative with discussion.      Vital Signs: BP 95/82 (BP Location: Left Arm)   Pulse 95   Temp 98.3 F (36.8 C) (Oral)   Resp 16   Ht _0  (1.676 m)   Wt 95.7 kg   SpO2 99%   BMI 34.05 kg/m  Pain Scale: 0-10 POSS *See Group Information*: S-Acceptable,Sleep, easy to arouse Pain Score: 4    SpO2: SpO2: 99 % O2 Device:SpO2: 99 % O2 Flow Rate: .O2 Flow Rate (L/min): 4 L/min  IO: Intake/output summary:   Intake/Output Summary (Last 24 hours) at 11/06/2019 1208 Last data filed at 11/06/2019 0100 Gross per 24 hour  Intake --  Output 500 ml  Net -500 ml    LBM: Last BM Date: 11/06/19 Baseline Weight: Weight: 97 kg Most recent weight: Weight: 95.7 kg     Palliative Assessment/Data:     Time In: 1400 Time Out: 1430 Time Total: 30 min Greater than 50%  of this time was spent counseling and coordinating care related to the above assessment and plan.  Signed by: Vinie Sill, NP Palliative Medicine Team Pager # [phone] (M-F 8a-5p) Team Phone # [phone] (Nights/Weekends)

## 2019-11-06 NOTE — Progress Notes (Addendum)
[facility]  ROUNDING NOTE   Subjective:   [name] admitted on 10/19/2019 for Peripheral edema [R60.9] Acute CHF (congestive heart failure) ([place]) [I50.9] AKI (acute kidney injury) ([place]) [N17.9] Congestive heart failure, unspecified HF chronicity, unspecified heart failure type ([place]) [A63.0] Acute diastolic (congestive) heart failure ([place]) [I50.31]  Patient resting in bed , case management working on patient's disposition to a skilled nursing facility. We will plan for dialysis again  today.     Objective:  Vital signs in last 24 hours:  Temp:  [98.3 F (36.8 C)-98.9 F (37.2 C)] 98.9 F (37.2 C) (10/29 1128) Pulse Rate:  [95-110] 95 (10/29 0855) Resp:  [16-17] 16 (10/29 0855) BP: (91-104)/(66-87) 94/72 (10/29 1215) SpO2:  [99 %-100 %] 99 % (10/29 0855)  Weight change:  Filed Weights   11/02/19 0620 11/03/19 0410 11/05/19 0236  Weight: 97.8 kg 95.4 kg 95.7 kg    Intake/Output: I/O last 3 completed shifts: In: 520 [P.O.:520] Out: 500 [Urine:500]   Intake/Output this shift:  No intake/output data recorded.  Physical Exam: General:  Resting in bed, in no acute distress  Head  normocephalic, atraumatic  Lungs:   Respiration even, unlabored, lungs clear, fine crackles at the bases  Heart:  S1S2, no rubs or gallops  Abdomen:   Non distended, nontender  Extremities:  2+ peripheral edema  Neurologic:  Alert, awake, oriented  Skin: No acute lesions or rashes  Rt IJ catheter  Basic Metabolic Panel: Recent Labs  Lab 11/01/19 0633 11/01/19 1601 11/02/19 0924 11/02/19 0924 11/04/19 0454 11/04/19 1109 11/05/19 0516 11/06/19 0453  NA 136  --  130*  --  134*  --  135 133*  K 3.4*  --  3.2*  --  3.3*  --  3.3* 3.8  CL 96*  --  92*  --  95*  --  97* 95*  CO2 29  --  27  --  29  --  29 28  GLUCOSE 109*  --  132*  --  118*  --  141* 100*  BUN 72*  --  71*  --  64*  --  37* 38*  CREATININE 3.50*  --  3.67*  --  3.24*  --  2.21* 2.48*  CALCIUM 9.2    < > 8.3*   < > 8.9  --  8.6* 8.7*  MG  --   --  1.7  --   --   --   --   --   PHOS  --   --   --   --   --  2.9  --  2.9   < > = values in this interval not displayed.    Liver Function Tests: No results for input(s): AST, ALT, ALKPHOS, BILITOT, PROT, ALBUMIN in the last 168 hours. No results for input(s): LIPASE, AMYLASE in the last 168 hours. Recent Labs  Lab 11/04/19 0454  AMMONIA 27    CBC: Recent Labs  Lab 11/02/19 0924 11/03/19 0628 11/04/19 0454 11/05/19 0516 11/06/19 0453  WBC 12.9* 11.4* 8.8 7.1 7.6  NEUTROABS 10.6* 9.0* 6.6 4.8 4.8  HGB 10.6* 8.9* 9.4* 8.7* 8.4*  HCT 31.0* 26.7* 28.4* 26.0* 26.0*  MCV 69.7* 70.6* 70.0* 69.9* 71.2*  PLT 296 313 386 372 436*    Cardiac Enzymes: No results for input(s): CKTOTAL, CKMB, CKMBINDEX, TROPONINI in the last 168 hours.  BNP: Invalid input(s): POCBNP  CBG: Recent Labs  Lab 11/05/19 0758  GLUCAP 98    Microbiology:  Results for orders placed or performed during the hospital encounter of 10/19/19  Respiratory Panel by RT PCR (Flu A&B, Covid) - Nasopharyngeal Swab     Status: None   Collection Time: 10/19/19 11:13 PM   Specimen: Nasopharyngeal Swab  Result Value Ref Range Status   SARS Coronavirus 2 by RT PCR NEGATIVE NEGATIVE Final    Comment: (NOTE) SARS-CoV-2 target nucleic acids are NOT DETECTED.  The SARS-CoV-2 RNA is generally detectable in upper respiratoy specimens during the acute phase of infection. The lowest concentration of SARS-CoV-2 viral copies this assay can detect is 131 copies/mL. A negative result does not preclude SARS-Cov-2 infection and should not be used as the sole basis for treatment or other patient management decisions. A negative result may occur with  improper specimen collection/handling, submission of specimen other than nasopharyngeal swab, presence of viral mutation(s) within the areas targeted by this assay, and inadequate number of viral copies (<131 copies/mL). A negative  result must be combined with clinical observations, patient history, and epidemiological information. The expected result is Negative.  Fact Sheet for Patients:  PinkCheek.be  Fact Sheet for Healthcare Providers:  GravelBags.it  This test is no t yet approved or cleared by the [place] FDA and  has been authorized for detection and/or diagnosis of SARS-CoV-2 by FDA under an Emergency Use Authorization (EUA). This EUA will remain  in effect (meaning this test can be used) for the duration of the COVID-19 declaration under Section 564(b)(1) of the Act, 21 U.S.C. section 360bbb-3(b)(1), unless the authorization is terminated or revoked sooner.     Influenza A by PCR NEGATIVE NEGATIVE Final   Influenza B by PCR NEGATIVE NEGATIVE Final    Comment: (NOTE) The Xpert Xpress SARS-CoV-2/FLU/RSV assay is intended as an aid in  the diagnosis of influenza from Nasopharyngeal swab specimens and  should not be used as a sole basis for treatment. Nasal washings and  aspirates are unacceptable for Xpert Xpress SARS-CoV-2/FLU/RSV  testing.  Fact Sheet for Patients: PinkCheek.be  Fact Sheet for Healthcare Providers: GravelBags.it  This test is not yet approved or cleared by the [place] FDA and  has been authorized for detection and/or diagnosis of SARS-CoV-2 by  FDA under an Emergency Use Authorization (EUA). This EUA will remain  in effect (meaning this test can be used) for the duration of the  Covid-19 declaration under Section 564(b)(1) of the Act, 21  U.S.C. section 360bbb-3(b)(1), unless the authorization is  terminated or revoked. Performed at [facility], [address] [place], [place] [address]   MRSA PCR Screening     Status: None   Collection Time: 10/25/19  2:50 PM   Specimen: Nasopharyngeal  Result Value Ref Range Status   MRSA by PCR  NEGATIVE NEGATIVE Final    Comment:        The GeneXpert MRSA Assay (FDA approved for NASAL specimens only), is one component of a comprehensive MRSA colonization surveillance program. It is not intended to diagnose MRSA infection nor to guide or monitor treatment for MRSA infections. Performed at [facility], [address] [place], [place] [address]   CULTURE, BLOOD (ROUTINE X 2) w Reflex to ID Panel     Status: None   Collection Time: 10/25/19  4:29 PM   Specimen: BLOOD  Result Value Ref Range Status   Specimen Description BLOOD CENTRAL LINE  Final   Special Requests   Final    BOTTLES DRAWN AEROBIC AND ANAEROBIC Blood Culture adequate volume  Culture   Final    NO GROWTH 5 DAYS Performed at [facility], [address] [place], [place] [address]    Report Status 10/30/2019 FINAL  Final  CULTURE, BLOOD (ROUTINE X 2) w Reflex to ID Panel     Status: None   Collection Time: 10/26/19  6:45 AM   Specimen: BLOOD  Result Value Ref Range Status   Specimen Description BLOOD BLOOD LEFT HAND  Final   Special Requests   Final    BOTTLES DRAWN AEROBIC AND ANAEROBIC Blood Culture adequate volume   Culture   Final    NO GROWTH 5 DAYS Performed at [facility], [address], [place], [place] [address]    Report Status 10/31/2019 FINAL  Final  Body fluid culture     Status: None   Collection Time: 10/26/19 11:30 AM   Specimen: Fluid  Result Value Ref Range Status   Specimen Description   Final    FLUID Performed at [facility], [address] [place], [place] [address]    Special Requests   Final    PERITONEAL Performed at [facility], [address] [place], [place] [address]    Gram Stain   Final    ABUNDANT WBC PRESENT, PREDOMINANTLY PMN NO ORGANISMS SEEN    Culture   Final    NO GROWTH Performed at [facility], [place] [address] [place], [place] [address]    Report Status 10/30/2019 FINAL  Final  Body  fluid culture     Status: None   Collection Time: 10/29/19  1:39 PM   Specimen: PATH Cytology Peritoneal fluid  Result Value Ref Range Status   Specimen Description   Final    PERITONEAL Performed at [facility], [address] [place], [place] [address]    Special Requests   Final    NONE Performed at [facility], [address] [place], [place] [address]    Gram Stain   Final    MODERATE WBC PRESENT, PREDOMINANTLY PMN NO ORGANISMS SEEN    Culture   Final    NO GROWTH 3 DAYS Performed at [facility], [place] [address] [place], [place] [address]    Report Status 11/02/2019 FINAL  Final  Gram stain     Status: None   Collection Time: 11/02/19  3:30 PM   Specimen: PATH Cytology Peritoneal fluid  Result Value Ref Range Status   Specimen Description PERITONEAL  Final   Special Requests NONE  Final   Gram Stain   Final    WBC SEEN RED BLOOD CELLS NO ORGANISMS SEEN Performed at [facility], [address] [place], [place] [address]    Report Status 11/02/2019 FINAL  Final    Coagulation Studies: No results for input(s): LABPROT, INR in the last 72 hours.  Urinalysis: No results for input(s): COLORURINE, LABSPEC, PHURINE, GLUCOSEU, HGBUR, BILIRUBINUR, KETONESUR, PROTEINUR, UROBILINOGEN, NITRITE, LEUKOCYTESUR in the last 72 hours.  Invalid input(s): APPERANCEUR    Imaging: No results found.   Medications:   . sodium chloride Stopped (11/01/19 1131)  . albumin human 25 g (11/06/19 1138)  . cefTRIAXone (ROCEPHIN)  IV 2 g (11/05/19 1057)   . apixaban  5 mg Oral BID  . Chlorhexidine Gluconate Cloth  6 each Topical Daily  . feeding supplement (NEPRO CARB STEADY)  237 mL Oral Q24H  . furosemide  60 mg Intravenous BID  . iron polysaccharides  150 mg Oral Daily  .  mouth rinse  15 mL Mouth Rinse BID  . melatonin  2.5 mg Oral QHS  . midodrine  10 mg Oral BID WC  . pantoprazole  40 mg Oral BID  . sodium chloride flush  3 mL  Intravenous Q12H   sodium chloride, acetaminophen, albumin human, albuterol, alum & mag hydroxide-simeth **AND** lidocaine, bisacodyl, calcium carbonate, Glycerin (Adult), loperamide, ondansetron (ZOFRAN) IV, ondansetron (ZOFRAN) IV, oxyCODONE-acetaminophen, sodium chloride flush  Assessment/ Plan:  [name] is a 64 y.o. black female with congestive heart failure, hypertension, hyperlipidemia, gout, COPD, anemia, cocaine abuse who is admitted to [facility] on 10/19/2019 for Peripheral edema [R60.9] Acute CHF (congestive heart failure) ([place]) [I50.9] AKI (acute kidney injury) ([place]) [N17.9] Congestive heart failure, unspecified HF chronicity, unspecified heart failure type (HCC) [K02.5] Acute diastolic (congestive) heart failure ([place]) [I50.31]  1. Acute kidney injury on chronic kidney disease stage IIIB. Baseline creatinine of 1.66, GFR of 32. With history of proteinuria.  Chronic kidney disease secondary to hypertension  Acute renal failure secondary to acute cardiorenal syndrome   Lab Results  Component Value Date   CREATININE 2.48 (H) 11/06/2019   CREATININE 2.21 (H) 11/05/2019   CREATININE 3.24 (H) 11/04/2019  Urine output for the preceding 24 hours is 500 ml Patient was unable to tolerate previous dialysis treatment due to  hypotension.  Planning to dialyze patient today, will give albumin with dialysis   Educated patient on taking care of right IJ [facility], when getting discharged. Patient verbalized understanding.   2. Hypotension with acute exacerbation of systolic and diastolic congestive heart failure. Echocardiogram 06/22/19 EF of 25-30%. She is on Furosemide 60 mg IV BID Blood Pressure stays low Increased midodrine to 10 mg BID  4. Anemia of Chronic Disease  Hemoglobin level not at goal, 8.4 today No acute indication for blood transfusion Patient is on oral iron supplements for iron deficiency anemia      LOS: 18 Princy Raju 10/29/20213:05 PM   I saw and  evaluated the patient and discussed the care with Crosby Oyster, DNP.  I agree with the findings and plan as documented in the note.  Patient underwent dialysis treatment today.  Ultrafiltration achieved was 1 kg.  Emillio Ngo Holley Raring , [facility] 10/29/20218:18 PM

## 2019-11-06 NOTE — Progress Notes (Signed)
Pt returned from dialysis in stable condition.  VS taken.

## 2019-11-07 DIAGNOSIS — K652 Spontaneous bacterial peritonitis: Secondary | ICD-10-CM | POA: Diagnosis not present

## 2019-11-07 LAB — CBC WITH DIFFERENTIAL/PLATELET
Abs Immature Granulocytes: 0.05 10*3/uL (ref 0.00–0.07)
Basophils Absolute: 0.1 10*3/uL (ref 0.0–0.1)
Basophils Relative: 1 %
Eosinophils Absolute: 0.2 10*3/uL (ref 0.0–0.5)
Eosinophils Relative: 2 %
HCT: 27.6 % — ABNORMAL LOW (ref 36.0–46.0)
Hemoglobin: 8.9 g/dL — ABNORMAL LOW (ref 12.0–15.0)
Immature Granulocytes: 1 %
Lymphocytes Relative: 11 %
Lymphs Abs: 0.9 10*3/uL (ref 0.7–4.0)
MCH: 23.1 pg — ABNORMAL LOW (ref 26.0–34.0)
MCHC: 32.2 g/dL (ref 30.0–36.0)
MCV: 71.5 fL — ABNORMAL LOW (ref 80.0–100.0)
Monocytes Absolute: 1.7 10*3/uL — ABNORMAL HIGH (ref 0.1–1.0)
Monocytes Relative: 20 %
Neutro Abs: 5.4 10*3/uL (ref 1.7–7.7)
Neutrophils Relative %: 65 %
Platelets: 448 10*3/uL — ABNORMAL HIGH (ref 150–400)
RBC: 3.86 MIL/uL — ABNORMAL LOW (ref 3.87–5.11)
RDW: 20.4 % — ABNORMAL HIGH (ref 11.5–15.5)
WBC: 8.2 10*3/uL (ref 4.0–10.5)
nRBC: 0 % (ref 0.0–0.2)

## 2019-11-07 NOTE — Progress Notes (Signed)
[facility]  ROUNDING NOTE   Subjective:   Patient states she is doing well and has no complaints.  Tolerated dialysis treatment yesterday. UF fo 1 liter.    Objective:  Vital signs in last 24 hours:  Temp:  [97.8 F (36.6 C)-98.6 F (37 C)] 97.8 F (36.6 C) (10/30 1520) Pulse Rate:  [76-90] 90 (10/30 1520) Resp:  [16-18] 18 (10/30 1520) BP: (94-98)/(75-84) 98/80 (10/30 1520) SpO2:  [95 %-100 %] 95 % (10/30 1520)  Weight change:  Filed Weights   11/02/19 0620 11/03/19 0410 11/05/19 0236  Weight: 97.8 kg 95.4 kg 95.7 kg    Intake/Output: I/O last 3 completed shifts: In: 215 [P.O.:215] Out: 1700 [Urine:700; Other:1000]   Intake/Output this shift:  No intake/output data recorded.  Physical Exam: General:  Resting in bed, in no acute distress  Head  normocephalic, atraumatic  Lungs:   Respiration even, unlabored, lungs clear, fine crackles at the bases  Heart:  S1S2, no rubs or gallops  Abdomen:   Non distended, nontender  Extremities:  2+ peripheral edema  Neurologic:  Alert, awake, oriented  Skin: No acute lesions or rashes  Rt IJ catheter  Basic Metabolic Panel: Recent Labs  Lab 11/01/19 0633 11/01/19 5916 11/02/19 0924 11/02/19 0924 11/04/19 0454 11/04/19 1109 11/05/19 0516 11/06/19 0453  NA 136  --  130*  --  134*  --  135 133*  K 3.4*  --  3.2*  --  3.3*  --  3.3* 3.8  CL 96*  --  92*  --  95*  --  97* 95*  CO2 29  --  27  --  29  --  29 28  GLUCOSE 109*  --  132*  --  118*  --  141* 100*  BUN 72*  --  71*  --  64*  --  37* 38*  CREATININE 3.50*  --  3.67*  --  3.24*  --  2.21* 2.48*  CALCIUM 9.2   < > 8.3*   < > 8.9  --  8.6* 8.7*  MG  --   --  1.7  --   --   --   --   --   PHOS  --   --   --   --   --  2.9  --  2.9   < > = values in this interval not displayed.    Liver Function Tests: No results for input(s): AST, ALT, ALKPHOS, BILITOT, PROT, ALBUMIN in the last 168 hours. No results for input(s): LIPASE, AMYLASE in the last  168 hours. Recent Labs  Lab 11/04/19 0454  AMMONIA 27    CBC: Recent Labs  Lab 11/03/19 0628 11/04/19 0454 11/05/19 0516 11/06/19 0453 11/07/19 0720  WBC 11.4* 8.8 7.1 7.6 8.2  NEUTROABS 9.0* 6.6 4.8 4.8 5.4  HGB 8.9* 9.4* 8.7* 8.4* 8.9*  HCT 26.7* 28.4* 26.0* 26.0* 27.6*  MCV 70.6* 70.0* 69.9* 71.2* 71.5*  PLT 313 386 372 436* 448*    Cardiac Enzymes: No results for input(s): CKTOTAL, CKMB, CKMBINDEX, TROPONINI in the last 168 hours.  BNP: Invalid input(s): POCBNP  CBG: Recent Labs  Lab 11/05/19 3846  KZLDJT 70    Microbiology: Results for orders placed or performed during the hospital encounter of 10/19/19  Respiratory Panel by RT PCR (Flu A&B, Covid) - Nasopharyngeal Swab     Status: None   Collection Time: 10/19/19 11:13 PM   Specimen: Nasopharyngeal Swab  Result Value Ref Range Status  SARS Coronavirus 2 by RT PCR NEGATIVE NEGATIVE Final    Comment: (NOTE) SARS-CoV-2 target nucleic acids are NOT DETECTED.  The SARS-CoV-2 RNA is generally detectable in upper respiratoy specimens during the acute phase of infection. The lowest concentration of SARS-CoV-2 viral copies this assay can detect is 131 copies/mL. A negative result does not preclude SARS-Cov-2 infection and should not be used as the sole basis for treatment or other patient management decisions. A negative result may occur with  improper specimen collection/handling, submission of specimen other than nasopharyngeal swab, presence of viral mutation(s) within the areas targeted by this assay, and inadequate number of viral copies (<131 copies/mL). A negative result must be combined with clinical observations, patient history, and epidemiological information. The expected result is Negative.  Fact Sheet for Patients:  PinkCheek.be  Fact Sheet for Healthcare Providers:  GravelBags.it  This test is no t yet approved or cleared by the  [place] FDA and  has been authorized for detection and/or diagnosis of SARS-CoV-2 by FDA under an Emergency Use Authorization (EUA). This EUA will remain  in effect (meaning this test can be used) for the duration of the COVID-19 declaration under Section 564(b)(1) of the Act, 21 U.S.C. section 360bbb-3(b)(1), unless the authorization is terminated or revoked sooner.     Influenza A by PCR NEGATIVE NEGATIVE Final   Influenza B by PCR NEGATIVE NEGATIVE Final    Comment: (NOTE) The Xpert Xpress SARS-CoV-2/FLU/RSV assay is intended as an aid in  the diagnosis of influenza from Nasopharyngeal swab specimens and  should not be used as a sole basis for treatment. Nasal washings and  aspirates are unacceptable for Xpert Xpress SARS-CoV-2/FLU/RSV  testing.  Fact Sheet for Patients: PinkCheek.be  Fact Sheet for Healthcare Providers: GravelBags.it  This test is not yet approved or cleared by the [place] FDA and  has been authorized for detection and/or diagnosis of SARS-CoV-2 by  FDA under an Emergency Use Authorization (EUA). This EUA will remain  in effect (meaning this test can be used) for the duration of the  Covid-19 declaration under Section 564(b)(1) of the Act, 21  U.S.C. section 360bbb-3(b)(1), unless the authorization is  terminated or revoked. Performed at [facility], [address] [place], [place] [address]   MRSA PCR Screening     Status: None   Collection Time: 10/25/19  2:50 PM   Specimen: Nasopharyngeal  Result Value Ref Range Status   MRSA by PCR NEGATIVE NEGATIVE Final    Comment:        The GeneXpert MRSA Assay (FDA approved for NASAL specimens only), is one component of a comprehensive MRSA colonization surveillance program. It is not intended to diagnose MRSA infection nor to guide or monitor treatment for MRSA infections. Performed at [facility], [address] [place], [place] [address]   CULTURE, BLOOD (ROUTINE X 2) w Reflex to ID Panel     Status: None   Collection Time: 10/25/19  4:29 PM   Specimen: BLOOD  Result Value Ref Range Status   Specimen Description BLOOD CENTRAL LINE  Final   Special Requests   Final    BOTTLES DRAWN AEROBIC AND ANAEROBIC Blood Culture adequate volume   Culture   Final    NO GROWTH 5 DAYS Performed at [facility], [address] [place], [place] [address]    Report Status 10/30/2019 FINAL  Final  CULTURE, BLOOD (ROUTINE X 2) w Reflex to ID Panel     Status:  None   Collection Time: 10/26/19  6:45 AM   Specimen: BLOOD  Result Value Ref Range Status   Specimen Description BLOOD BLOOD LEFT HAND  Final   Special Requests   Final    BOTTLES DRAWN AEROBIC AND ANAEROBIC Blood Culture adequate volume   Culture   Final    NO GROWTH 5 DAYS Performed at [facility], [address] [place], [place] [address]    Report Status 10/31/2019 FINAL  Final  Body fluid culture     Status: None   Collection Time: 10/26/19 11:30 AM   Specimen: Fluid  Result Value Ref Range Status   Specimen Description   Final    FLUID Performed at [facility], [address] [place], [place] [address]    Special Requests   Final    PERITONEAL Performed at [facility], [address] [place], [place] [address]    Gram Stain   Final    ABUNDANT WBC PRESENT, PREDOMINANTLY PMN NO ORGANISMS SEEN    Culture   Final    NO GROWTH Performed at [facility], [place] [address], [place], [place] [address]    Report Status 10/30/2019 FINAL  Final  Body fluid culture     Status: None   Collection Time: 10/29/19  1:39 PM   Specimen: PATH Cytology Peritoneal fluid  Result Value Ref Range Status   Specimen Description   Final    PERITONEAL Performed at [facility], [address] [place], [place] [address]    Special Requests   Final    NONE Performed at [facility],  [address] [place], [place] [address]    Gram Stain   Final    MODERATE WBC PRESENT, PREDOMINANTLY PMN NO ORGANISMS SEEN    Culture   Final    NO GROWTH 3 DAYS Performed at [facility], [place] [address] [place], [place] [address]    Report Status 11/02/2019 FINAL  Final  Gram stain     Status: None   Collection Time: 11/02/19  3:30 PM   Specimen: PATH Cytology Peritoneal fluid  Result Value Ref Range Status   Specimen Description PERITONEAL  Final   Special Requests NONE  Final   Gram Stain   Final    WBC SEEN RED BLOOD CELLS NO ORGANISMS SEEN Performed at [facility], [address], [place], [place] [address]    Report Status 11/02/2019 FINAL  Final    Coagulation Studies: No results for input(s): LABPROT, INR in the last 72 hours.  Urinalysis: No results for input(s): COLORURINE, LABSPEC, PHURINE, GLUCOSEU, HGBUR, BILIRUBINUR, KETONESUR, PROTEINUR, UROBILINOGEN, NITRITE, LEUKOCYTESUR in the last 72 hours.  Invalid input(s): APPERANCEUR    Imaging: No results found.   Medications:   . sodium chloride Stopped (11/01/19 1131)  . albumin human 25 g (11/06/19 1138)  . cefTRIAXone (ROCEPHIN)  IV 2 g (11/07/19 1009)   . apixaban  5 mg Oral BID  . Chlorhexidine Gluconate Cloth  6 each Topical Daily  . feeding supplement (NEPRO CARB STEADY)  237 mL Oral Q24H  . furosemide  60 mg Intravenous BID  . iron polysaccharides  150 mg Oral Daily  . mouth rinse  15 mL Mouth Rinse BID  . melatonin  2.5 mg Oral QHS  . midodrine  10 mg Oral BID WC  . pantoprazole  40 mg Oral BID  . sodium chloride flush  3 mL Intravenous Q12H   sodium  chloride, acetaminophen, albumin human, albuterol, alum & mag hydroxide-simeth **AND** lidocaine, bisacodyl, calcium carbonate, Glycerin (Adult), loperamide, ondansetron (ZOFRAN) IV, ondansetron (ZOFRAN) IV, oxyCODONE-acetaminophen, sodium chloride flush  Assessment/ Plan:  Ms. Dori Devino is a 64 y.o. black female with  congestive heart failure, hypertension, hyperlipidemia, gout, COPD, anemia, cocaine abuse who is admitted to [facility] on 10/19/2019 for Peripheral edema [R60.9] Acute CHF (congestive heart failure) ([place]) [I50.9] AKI (acute kidney injury) ([place]) [N17.9] Congestive heart failure, unspecified HF chronicity, unspecified heart failure type ([place]) [Q68.3] Acute diastolic (congestive) heart failure ([place]) [I50.31]  1. Acute kidney injury on chronic kidney disease stage IIIB. Baseline creatinine of 1.66, GFR of 32. With history of proteinuria.  Chronic kidney disease secondary to hypertension  Acute renal failure secondary to acute cardiorenal syndrome   Lab Results  Component Value Date   CREATININE 2.48 (H) 11/06/2019   CREATININE 2.21 (H) 11/05/2019   CREATININE 3.24 (H) 11/04/2019   No indication for dialysis. Monitor daily.    2. Hypotension with acute exacerbation of systolic and diastolic congestive heart failure. Echocardiogram 06/22/19 EF of 25-30%. -  Furosemide 60 mg IV BID - midodrine 10 mg BID  4. Anemia of Chronic Disease: hemoglobin 8.9 Received EPO     LOS: [address] 10/30/20214:44 PM

## 2019-11-07 NOTE — Progress Notes (Signed)
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 Found to have BNP greater than 4500, elevated troponin and AKI.  Multiple prior admissions with similar symptoms and complaints. Prolonged hospitalization in August 2021 when she was discharged to SNF. Not clear when she get out of SNF. Continues to complain that she is homeless but per social worker she do have a house. Not take her medication despite being provided with medical management.  After admission to the hospital, she was diagnosed with acute on chronic combinedsystolicanddiastoliccongestiveheartfailurewith ejection fraction 25 to 30%. She was not responding to IV Lasix. She was seen by nephrology, temporary dialysis catheter was performed and she was dialyzed on 10/17 and 10/18. Patient also complaining of abdominal pain, she had a paracentesis, study showed possible spontaneous peritonitis, she was treated with Rocephin and Flagyl.  Patient had a worsening respiratory status on 10/17, was diagnosed with aspiration pneumonia. She was treated with Rocephin, Zithromax and Flagyl. Patient also had abdominal pain and a CT scan suspect ischemia, but seen by general surgery on 10/19, no need for surgery.  10/21.Repeat paracentesis performed, antibiotic changed to Rocephin only. Permacath placed.  10/22.Repeated paracentesis showed white cell count of 3048, 69% neutrophils. However, patient clinically improving. Continue Rocephin  10/26.  Repeated paracentesis showed white cell count of 1636.   10/27: The patient was unable to  tolerate ultrafiltration due to hypotension.  Consultants  . Gastroenterology . Psychiatry . General Surgery . Vascular surgery . Cardiology . Nephrology  Procedures  . Paracentesis x 3 . Placement of temporary dialysis catheter. . Dialysis  Antibiotics   Anti-infectives (From admission, onward)   Start     Dose/Rate Route Frequency Ordered Stop   11/01/19 1000  cefTRIAXone (ROCEPHIN) 2 g in sodium chloride 0.9 % 100 mL IVPB        2 g 200 mL/hr over 30 Minutes Intravenous Daily 10/31/19 1521     10/27/19 1000  cefTRIAXone (ROCEPHIN) 2 g in sodium chloride 0.9 % 100 mL IVPB  Status:  Discontinued        2 g 200 mL/hr over 30 Minutes Intravenous Daily 10/26/19 1559 10/31/19 1521   10/26/19 2000  cefTRIAXone (ROCEPHIN) 1 g in sodium chloride 0.9 % 100 mL IVPB        1 g 200 mL/hr over 30 Minutes Intravenous  Once 10/26/19 1559 10/26/19 2024   10/26/19 1600  cefTRIAXone (ROCEPHIN) 2 g in sodium chloride 0.9 % 100 mL IVPB  Status:  Discontinued        2 g 200 mL/hr over 30 Minutes Intravenous Every 24 hours 10/26/19 1351 10/26/19 1600   10/26/19 1000  metroNIDAZOLE (FLAGYL) IVPB 500 mg  Status:  Discontinued        500 mg 100 mL/hr over 60 Minutes Intravenous Every 8 hours 10/26/19 0909 10/29/19 1138   10/25/19 1445  azithromycin (ZITHROMAX) 500 mg in sodium chloride 0.9 % 250 mL IVPB  Status:  Discontinued        500 mg 250 mL/hr over 60 Minutes Intravenous Every 24 hours 10/25/19 1353 10/29/19 1138   10/25/19 1230  azithromycin (ZITHROMAX) tablet 500 mg  Status:  Discontinued  500 mg Oral Daily 10/25/19 1133 10/25/19 1134   10/25/19 1030  cefTRIAXone (ROCEPHIN) 1 g in sodium chloride 0.9 % 100 mL IVPB  Status:  Discontinued        1 g 200 mL/hr over 30 Minutes Intravenous Every 12 hours 10/25/19 0931 10/26/19 1351     Subjective  The patient is resting quietly. No new complaints.  Objective   Vitals:  Vitals:   11/07/19 0047 11/07/19 0906  BP: 94/75 96/84   Pulse: 86 76  Resp: 17 16  Temp: 98.3 F (36.8 C) 98.6 F (37 C)  SpO2: 100% 98%   Exam:  Constitutional:  . The patient is awake, alert, and oriented x 3. No acute distress. Respiratory:  . No increased work of breathing. . No wheezes, rales, or rhonchi . No tactile fremitus Cardiovascular:  . Regular rate and rhythm . No murmurs, ectopy, or gallups. . No lateral PMI. No thrills. Abdomen:  . Abdomen is soft, non-tender, non-distended . No hernias, masses, or organomegaly . Normoactive bowel sounds.  Musculoskeletal:  . No cyanosis, clubbing, or edema Skin:  . No rashes, lesions, ulcers . palpation of skin: no induration or nodules Neurologic:  . CN 2-12 intact . Sensation all 4 extremities intact Psychiatric:  . Mental status o Mood, affect appropriate o Orientation to person, place, time  . judgment and insight appear intact I have personally reviewed the following:   Today's Data  . Vitals, BMP, CBC  Lab Data  . Pathology review of peritoneal fluid with cytospin. No malignant cells. Abundant inflammation. 1342 neutrophils.  Micro Data  . Peritoneal fluid: No growth.  Scheduled Meds: . apixaban  5 mg Oral BID  . Chlorhexidine Gluconate Cloth  6 each Topical Daily  . feeding supplement (NEPRO CARB STEADY)  237 mL Oral Q24H  . furosemide  60 mg Intravenous BID  . iron polysaccharides  150 mg Oral Daily  . mouth rinse  15 mL Mouth Rinse BID  . melatonin  2.5 mg Oral QHS  . midodrine  10 mg Oral BID WC  . pantoprazole  40 mg Oral BID  . sodium chloride flush  3 mL Intravenous Q12H   Continuous Infusions: . sodium chloride Stopped (11/01/19 1131)  . albumin human 25 g (11/06/19 1138)  . cefTRIAXone (ROCEPHIN)  IV 2 g (11/07/19 1009)    Principal Problem:   Spontaneous bacterial peritonitis ([place]) Active Problems:   AKI (acute kidney injury) ([place])   Crack cocaine use   Acute respiratory failure with hypoxia (HCC)   LV (left ventricular) mural  thrombus   Nonsustained ventricular tachycardia (HCC)   Acute CHF (congestive heart failure) (HCC)   Cocaine abuse ([place])   Peripheral edema   Cirrhosis of liver (HCC)   Dementia (HCC)   Aspiration pneumonia of both lower lobes due to gastric secretions (HCC)   Acute diastolic (congestive) heart failure (HCC)   Cardiorenal disease   Goals of care, counseling/discussion   Palliative care by specialist   LOS: 19 days   A & P  Spontaneous bacterial peritonitis: Repeat paracentesis showed significant decrease of white cell in ascites, antibiotics effective. Hoever she still has a lot of neutrophils in 11/03/2019 fluid.  Culture has been negative. Continue Rocephin.  Acute on chronic combined systolic and diastolic congestive heart failure. Acute hypoxemic respiratory failure. Positive for continued rales at bases bilaterally.  Volume management with ultrafiltration. Patient has not been responding to IV Lasix.  Liver cirrhosis with ascites: Noted. Continue to  follow.  Acute renal failure on chronic kidney disease stage IIIb. Renal function appears to be improving.  Patient still has permacath placed. Due to patient poor response to diuretics, she will scheduled ultrafiltration as outpatient, although she does not meet criteria for the diagnosis of End Stage renal disease.Marland Kitchen  However, due to patient polysubstance abuse, the presence of a permacath presents a danger to her well being. On the other hand the patient cannot survive without dialysis. Today in the presence of Dr. Holley Raring, the patient agreed that she would avoid illicit drug use if we left the permacath in place. I explained to her that the use of illicit substances would put her life in danger, and require the removal of the dialysis catheter. She has voiced understanding.   Left ventricular thrombus with history of CVA. Patient was initially concerned for possible GI bleed, anticoagulation was on hold.  However, there is no GI bleed  observed while in the hospital.  GI was not concerned about it.  It is not safe to continue hold anticoagulation.  Anticoagulation has now been restarted.  Monitor hemoglobin.  Overgrown toenails: The patient is complaining of badly over grown toenails. I have explained to her that podiatrists are unable to clut toenails as inpatient. Nurses aren't able to cut them. The patient states that every time she has an appointment to see podiatry for them as outpatient, she is inpatient.   Anemia of chronic disease: Hemoglobin still stable.  Patient had a normal B12 level recently, but she also has mild iron deficiency.  Oral supplement.  Hypokalemia: Supplement and monitor.  I have seen and examined this patient myself. I have spent 32 minutes in her evaluation and care.  DVT prophylaxis: Eliquis Code Status: Full Family Communication: None available Disposition Plan:   Status is: Inpatient  Remains inpatient appropriate because:Inpatient level of care appropriate due to severity of illness   Dispo: The patient is from: Home  Anticipated d/c is to: SNF. Patient has been approved for nursing home placement.  Has a bed available, but is still requiring dialysis.  Anticipated d/c date is: 2 days  Patient currently is not medically stable to d/c.    Shaquisha Wynn, DO [facility] Direct contact: see [email or web address]  7PM-7AM contact night coverage as above 11/07/2019, 2:57 PM  LOS: 16 days

## 2019-11-08 DIAGNOSIS — K652 Spontaneous bacterial peritonitis: Secondary | ICD-10-CM | POA: Diagnosis not present

## 2019-11-08 LAB — CBC WITH DIFFERENTIAL/PLATELET
Abs Immature Granulocytes: 0.04 10*3/uL (ref 0.00–0.07)
Basophils Absolute: 0.1 10*3/uL (ref 0.0–0.1)
Basophils Relative: 1 %
Eosinophils Absolute: 0.3 10*3/uL (ref 0.0–0.5)
Eosinophils Relative: 3 %
HCT: 25.1 % — ABNORMAL LOW (ref 36.0–46.0)
Hemoglobin: 8.1 g/dL — ABNORMAL LOW (ref 12.0–15.0)
Immature Granulocytes: 1 %
Lymphocytes Relative: 11 %
Lymphs Abs: 1 10*3/uL (ref 0.7–4.0)
MCH: 23.4 pg — ABNORMAL LOW (ref 26.0–34.0)
MCHC: 32.3 g/dL (ref 30.0–36.0)
MCV: 72.5 fL — ABNORMAL LOW (ref 80.0–100.0)
Monocytes Absolute: 1.4 10*3/uL — ABNORMAL HIGH (ref 0.1–1.0)
Monocytes Relative: 16 %
Neutro Abs: 6 10*3/uL (ref 1.7–7.7)
Neutrophils Relative %: 68 %
Platelets: 458 10*3/uL — ABNORMAL HIGH (ref 150–400)
RBC: 3.46 MIL/uL — ABNORMAL LOW (ref 3.87–5.11)
RDW: 19.8 % — ABNORMAL HIGH (ref 11.5–15.5)
WBC: 8.7 10*3/uL (ref 4.0–10.5)
nRBC: 0 % (ref 0.0–0.2)

## 2019-11-08 LAB — BASIC METABOLIC PANEL
Anion gap: 12 (ref 5–15)
BUN: 35 mg/dL — ABNORMAL HIGH (ref 8–23)
CO2: 26 mmol/L (ref 22–32)
Calcium: 8.7 mg/dL — ABNORMAL LOW (ref 8.9–10.3)
Chloride: 94 mmol/L — ABNORMAL LOW (ref 98–111)
Creatinine, Ser: 2.7 mg/dL — ABNORMAL HIGH (ref 0.44–1.00)
GFR, Estimated: 19 mL/min — ABNORMAL LOW (ref >60)
Glucose, Bld: 97 mg/dL (ref 70–99)
Potassium: 4.1 mmol/L (ref 3.5–5.1)
Sodium: 132 mmol/L — ABNORMAL LOW (ref 135–145)

## 2019-11-08 NOTE — Progress Notes (Signed)
PT Cancellation Note  Patient Details Name: Kiaya Haliburton MRN: [id number] DOB: [birth year]   Cancelled Treatment:    Reason Eval/Treat Not Completed: Patient declined to participate with PT services this date.  Pt stated "I just got some medication and I don't feel up to it".  Will attempt to see pt at a future date/time as medically appropriate.     Linus Salmons PT, DPT 11/08/19, 2:57 PM

## 2019-11-08 NOTE — Progress Notes (Signed)
[facility]  ROUNDING NOTE   Subjective:   Patient denies any issues with dialysis on Friday. Denies any shortness of breath, nausea, vomiting,diraahea. Has a great appetite. Admits to some edema.    Objective:  Vital signs in last 24 hours:  Temp:  [97.8 F (36.6 C)-98.2 F (36.8 C)] 98 F (36.7 C) (10/31 0828) Pulse Rate:  [90-98] 98 (10/31 0828) Resp:  [16-20] 16 (10/31 0828) BP: (91-98)/(68-80) 97/76 (10/31 0828) SpO2:  [95 %-100 %] 100 % (10/31 0828) Weight:  [95.7 kg] 95.7 kg (10/31 0644)  Weight change:  Filed Weights   11/03/19 0410 11/05/19 0236 11/08/19 0644  Weight: 95.4 kg 95.7 kg 95.7 kg    Intake/Output: I/O last 3 completed shifts: In: 35 [P.O.:575] Out: 200 [Urine:200]   Intake/Output this shift:  Total I/O In: 480 [P.O.:480] Out: -   Physical Exam: General: NAD,   Head: Normocephalic, atraumatic. Moist oral mucosal membranes  Eyes: Anicteric, PERRL  Neck: Supple, trachea midline  Lungs:  Clear to auscultation  Heart: Regular rate and rhythm  Abdomen:  Soft, nontender,   Extremities:  1+ peripheral edema.  Neurologic: Nonfocal, moving all four extremities  Skin: No lesions  Access: R IJ catheter     Basic Metabolic Panel: Recent Labs  Lab 11/02/19 0924 11/02/19 0924 11/04/19 0454 11/04/19 0454 11/04/19 1109 11/05/19 0516 11/06/19 0453 11/08/19 0316  NA 130*  --  134*  --   --  135 133* 132*  K 3.2*  --  3.3*  --   --  3.3* 3.8 4.1  CL 92*  --  95*  --   --  97* 95* 94*  CO2 27  --  29  --   --  29 28 26   GLUCOSE 132*  --  118*  --   --  141* 100* 97  BUN 71*  --  64*  --   --  37* 38* 35*  CREATININE 3.67*  --  3.24*  --   --  2.21* 2.48* 2.70*  CALCIUM 8.3*   < > 8.9   < >  --  8.6* 8.7* 8.7*  MG 1.7  --   --   --   --   --   --   --   PHOS  --   --   --   --  2.9  --  2.9  --    < > = values in this interval not displayed.    Liver Function Tests: No results for input(s): AST, ALT, ALKPHOS, BILITOT, PROT,  ALBUMIN in the last 168 hours. No results for input(s): LIPASE, AMYLASE in the last 168 hours. Recent Labs  Lab 11/04/19 0454  AMMONIA 27    CBC: Recent Labs  Lab 11/04/19 0454 11/05/19 0516 11/06/19 0453 11/07/19 0720 11/08/19 0316  WBC 8.8 7.1 7.6 8.2 8.7  NEUTROABS 6.6 4.8 4.8 5.4 6.0  HGB 9.4* 8.7* 8.4* 8.9* 8.1*  HCT 28.4* 26.0* 26.0* 27.6* 25.1*  MCV 70.0* 69.9* 71.2* 71.5* 72.5*  PLT 386 372 436* 448* 458*    Cardiac Enzymes: No results for input(s): CKTOTAL, CKMB, CKMBINDEX, TROPONINI in the last 168 hours.  BNP: Invalid input(s): POCBNP  CBG: Recent Labs  Lab 11/05/19 1062  IRSWNI 62    Microbiology: Results for orders placed or performed during the hospital encounter of 10/19/19  Respiratory Panel by RT PCR (Flu A&B, Covid) - Nasopharyngeal Swab     Status: None   Collection Time: 10/19/19 11:13  PM   Specimen: Nasopharyngeal Swab  Result Value Ref Range Status   SARS Coronavirus 2 by RT PCR NEGATIVE NEGATIVE Final    Comment: (NOTE) SARS-CoV-2 target nucleic acids are NOT DETECTED.  The SARS-CoV-2 RNA is generally detectable in upper respiratoy specimens during the acute phase of infection. The lowest concentration of SARS-CoV-2 viral copies this assay can detect is 131 copies/mL. A negative result does not preclude SARS-Cov-2 infection and should not be used as the sole basis for treatment or other patient management decisions. A negative result may occur with  improper specimen collection/handling, submission of specimen other than nasopharyngeal swab, presence of viral mutation(s) within the areas targeted by this assay, and inadequate number of viral copies (<131 copies/mL). A negative result must be combined with clinical observations, patient history, and epidemiological information. The expected result is Negative.  Fact Sheet for Patients:  PinkCheek.be  Fact Sheet for Healthcare Providers:   GravelBags.it  This test is no t yet approved or cleared by the [place] FDA and  has been authorized for detection and/or diagnosis of SARS-CoV-2 by FDA under an Emergency Use Authorization (EUA). This EUA will remain  in effect (meaning this test can be used) for the duration of the COVID-19 declaration under Section 564(b)(1) of the Act, 21 U.S.C. section 360bbb-3(b)(1), unless the authorization is terminated or revoked sooner.     Influenza A by PCR NEGATIVE NEGATIVE Final   Influenza B by PCR NEGATIVE NEGATIVE Final    Comment: (NOTE) The Xpert Xpress SARS-CoV-2/FLU/RSV assay is intended as an aid in  the diagnosis of influenza from Nasopharyngeal swab specimens and  should not be used as a sole basis for treatment. Nasal washings and  aspirates are unacceptable for Xpert Xpress SARS-CoV-2/FLU/RSV  testing.  Fact Sheet for Patients: PinkCheek.be  Fact Sheet for Healthcare Providers: GravelBags.it  This test is not yet approved or cleared by the [place] FDA and  has been authorized for detection and/or diagnosis of SARS-CoV-2 by  FDA under an Emergency Use Authorization (EUA). This EUA will remain  in effect (meaning this test can be used) for the duration of the  Covid-19 declaration under Section 564(b)(1) of the Act, 21  U.S.C. section 360bbb-3(b)(1), unless the authorization is  terminated or revoked. Performed at [facility], [address] [place], [place] [address]   MRSA PCR Screening     Status: None   Collection Time: 10/25/19  2:50 PM   Specimen: Nasopharyngeal  Result Value Ref Range Status   MRSA by PCR NEGATIVE NEGATIVE Final    Comment:        The GeneXpert MRSA Assay (FDA approved for NASAL specimens only), is one component of a comprehensive MRSA colonization surveillance program. It is not intended to diagnose MRSA infection nor to guide  or monitor treatment for MRSA infections. Performed at [facility], [address] [place], [place] [address]   CULTURE, BLOOD (ROUTINE X 2) w Reflex to ID Panel     Status: None   Collection Time: 10/25/19  4:29 PM   Specimen: BLOOD  Result Value Ref Range Status   Specimen Description BLOOD CENTRAL LINE  Final   Special Requests   Final    BOTTLES DRAWN AEROBIC AND ANAEROBIC Blood Culture adequate volume   Culture   Final    NO GROWTH 5 DAYS Performed at [facility], [address], [place], [place] [address]    Report Status 10/30/2019 FINAL  Final  CULTURE,  BLOOD (ROUTINE X 2) w Reflex to ID Panel     Status: None   Collection Time: 10/26/19  6:45 AM   Specimen: BLOOD  Result Value Ref Range Status   Specimen Description BLOOD BLOOD LEFT HAND  Final   Special Requests   Final    BOTTLES DRAWN AEROBIC AND ANAEROBIC Blood Culture adequate volume   Culture   Final    NO GROWTH 5 DAYS Performed at [facility], [address] [place], [place] [address]    Report Status 10/31/2019 FINAL  Final  Body fluid culture     Status: None   Collection Time: 10/26/19 11:30 AM   Specimen: Fluid  Result Value Ref Range Status   Specimen Description   Final    FLUID Performed at [facility], [address] [place], [place] [address]    Special Requests   Final    PERITONEAL Performed at [facility], [address] [place], [place] [address]    Gram Stain   Final    ABUNDANT WBC PRESENT, PREDOMINANTLY PMN NO ORGANISMS SEEN    Culture   Final    NO GROWTH Performed at [facility], [place] [address] [place], [place] [address]    Report Status 10/30/2019 FINAL  Final  Body fluid culture     Status: None   Collection Time: 10/29/19  1:39 PM   Specimen: PATH Cytology Peritoneal fluid  Result Value Ref Range Status   Specimen Description   Final    PERITONEAL Performed at [facility], [address]  [place], [place] [address]    Special Requests   Final    NONE Performed at [facility], [address] [place], [place] [address]    Gram Stain   Final    MODERATE WBC PRESENT, PREDOMINANTLY PMN NO ORGANISMS SEEN    Culture   Final    NO GROWTH 3 DAYS Performed at [facility], [place] [address], [place], [place] [address]    Report Status 11/02/2019 FINAL  Final  Gram stain     Status: None   Collection Time: 11/02/19  3:30 PM   Specimen: PATH Cytology Peritoneal fluid  Result Value Ref Range Status   Specimen Description PERITONEAL  Final   Special Requests NONE  Final   Gram Stain   Final    WBC SEEN RED BLOOD CELLS NO ORGANISMS SEEN Performed at [facility], [address] [place], [place] [address]    Report Status 11/02/2019 FINAL  Final    Coagulation Studies: No results for input(s): LABPROT, INR in the last 72 hours.  Urinalysis: No results for input(s): COLORURINE, LABSPEC, PHURINE, GLUCOSEU, HGBUR, BILIRUBINUR, KETONESUR, PROTEINUR, UROBILINOGEN, NITRITE, LEUKOCYTESUR in the last 72 hours.  Invalid input(s): APPERANCEUR    Imaging: No results found.   Medications:   . sodium chloride Stopped (11/01/19 1131)  . albumin human 25 g (11/06/19 1138)  . cefTRIAXone (ROCEPHIN)  IV 2 g (11/07/19 1009)   . apixaban  5 mg Oral BID  . Chlorhexidine Gluconate Cloth  6 each Topical Daily  . feeding supplement (NEPRO CARB STEADY)  237 mL Oral Q24H  . furosemide  60 mg Intravenous BID  . iron polysaccharides  150 mg Oral Daily  . mouth rinse  15 mL Mouth Rinse BID  . melatonin  2.5 mg Oral QHS  . midodrine  10 mg Oral BID WC  . pantoprazole  40 mg Oral  BID  . sodium chloride flush  3 mL Intravenous Q12H   sodium chloride, acetaminophen, albumin human, albuterol, alum & mag hydroxide-simeth **AND** lidocaine, bisacodyl, calcium carbonate, Glycerin (Adult), loperamide, ondansetron (ZOFRAN) IV, ondansetron (ZOFRAN) IV,  oxyCODONE-acetaminophen, sodium chloride flush  Assessment/ Plan:  [name] is a 64 y.o.  female with congestive heart failure, hypertension, hyperlipidemia, gout, COPD, anemia, cocaine abuse who is admitted to [facility] on 10/19/2019 for Peripheral edema [R60.9] Acute CHF (congestive heart failure) ([place]) [I50.9] AKI (acute kidney injury) ([place]) [N17.9] Congestive heart failure, unspecified HF chronicity, unspecified heart failure type (HCC) [Q22.4] Acute diastolic (congestive) heart failure ([place]) [I50.31]  1. Acute kidney injury on chronic kidney disease stage IIIB. Baseline creatinine of 1.66, GFR of 32. With history of proteinuria.  Chronic kidney disease secondary to hypertension  Acute renal failure secondary to acute cardiorenal syndrome - creatinine 2.7, GFR 19.  - no indication for dialysis today   2. Hypotension with acute exacerbation of systolic and diastolic congestive heart failure. Echocardiogram 06/22/19 EF of 25-30%. -  Furosemide 60 mg IV BID - midodrine 10 mg BID  3. Anemia of Chronic Disease: hemoglobin 8.1 Received EPO    LOS: [address] P Damarien Nyman 10/31/202110:44 AM

## 2019-11-08 NOTE — Progress Notes (Signed)
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 Found to have BNP greater than 4500, elevated troponin and AKI.  Multiple prior admissions with similar symptoms and complaints. Prolonged hospitalization in August 2021 when she was discharged to SNF. Not clear when she get out of SNF. Continues to complain that she is homeless but per social worker she do have a house. Not take her medication despite being provided with medical management.  After admission to the hospital, she was diagnosed with acute on chronic combinedsystolicanddiastoliccongestiveheartfailurewith ejection fraction 25 to 30%. She was not responding to IV Lasix. She was seen by nephrology, temporary dialysis catheter was performed and she was dialyzed on 10/17 and 10/18. Patient also complaining of abdominal pain, she had a paracentesis, study showed possible spontaneous peritonitis, she was treated with Rocephin and Flagyl.  Patient had a worsening respiratory status on 10/17, was diagnosed with aspiration pneumonia. She was treated with Rocephin, Zithromax and Flagyl. Patient also had abdominal pain and a CT scan suspect ischemia, but seen by general surgery on 10/19, no need for surgery.  10/21.Repeat paracentesis performed, antibiotic changed to Rocephin only. Permacath placed.  10/22.Repeated paracentesis showed white cell count of 3048, 69% neutrophils. However, patient clinically improving. Continue Rocephin  10/26.  Repeated paracentesis showed white cell count of 1636.   10/27: The patient was unable to  tolerate ultrafiltration due to hypotension.  Consultants  . Gastroenterology . Psychiatry . General Surgery . Vascular surgery . Cardiology . Nephrology  Procedures  . Paracentesis x 3 . Placement of temporary dialysis catheter. . Dialysis  Antibiotics   Anti-infectives (From admission, onward)   Start     Dose/Rate Route Frequency Ordered Stop   11/01/19 1000  cefTRIAXone (ROCEPHIN) 2 g in sodium chloride 0.9 % 100 mL IVPB        2 g 200 mL/hr over 30 Minutes Intravenous Daily 10/31/19 1521     10/27/19 1000  cefTRIAXone (ROCEPHIN) 2 g in sodium chloride 0.9 % 100 mL IVPB  Status:  Discontinued        2 g 200 mL/hr over 30 Minutes Intravenous Daily 10/26/19 1559 10/31/19 1521   10/26/19 2000  cefTRIAXone (ROCEPHIN) 1 g in sodium chloride 0.9 % 100 mL IVPB        1 g 200 mL/hr over 30 Minutes Intravenous  Once 10/26/19 1559 10/26/19 2024   10/26/19 1600  cefTRIAXone (ROCEPHIN) 2 g in sodium chloride 0.9 % 100 mL IVPB  Status:  Discontinued        2 g 200 mL/hr over 30 Minutes Intravenous Every 24 hours 10/26/19 1351 10/26/19 1600   10/26/19 1000  metroNIDAZOLE (FLAGYL) IVPB 500 mg  Status:  Discontinued        500 mg 100 mL/hr over 60 Minutes Intravenous Every 8 hours 10/26/19 0909 10/29/19 1138   10/25/19 1445  azithromycin (ZITHROMAX) 500 mg in sodium chloride 0.9 % 250 mL IVPB  Status:  Discontinued        500 mg 250 mL/hr over 60 Minutes Intravenous Every 24 hours 10/25/19 1353 10/29/19 1138   10/25/19 1230  azithromycin (ZITHROMAX) tablet 500 mg  Status:  Discontinued  500 mg Oral Daily 10/25/19 1133 10/25/19 1134   10/25/19 1030  cefTRIAXone (ROCEPHIN) 1 g in sodium chloride 0.9 % 100 mL IVPB  Status:  Discontinued        1 g 200 mL/hr over 30 Minutes Intravenous Every 12 hours 10/25/19 0931 10/26/19 1351     Subjective  The patient is resting quietly. No new complaints.  Objective   Vitals:  Vitals:   11/08/19 0828 11/08/19 1100  BP: 97/76   Pulse:  98   Resp: 16   Temp: 98 F (36.7 C)   SpO2: 100% 98%   Exam:  Constitutional:  . The patient is awake, alert, and oriented x 3. No acute distress. Respiratory:  . No increased work of breathing. . No wheezes, rales, or rhonchi . No tactile fremitus Cardiovascular:  . Regular rate and rhythm . No murmurs, ectopy, or gallups. . No lateral PMI. No thrills. Abdomen:  . Abdomen is soft, non-tender, non-distended . No hernias, masses, or organomegaly . Normoactive bowel sounds.  Musculoskeletal:  . No cyanosis, clubbing, or edema Skin:  . No rashes, lesions, ulcers . palpation of skin: no induration or nodules Neurologic:  . CN 2-12 intact . Sensation all 4 extremities intact Psychiatric:  . Mental status o Mood, affect appropriate o Orientation to person, place, time  . judgment and insight appear intact I have personally reviewed the following:   Today's Data  . Vitals, BMP, CBC  Lab Data  . Pathology review of peritoneal fluid with cytospin. No malignant cells. Abundant inflammation. 1342 neutrophils.  Micro Data  . Peritoneal fluid: No growth.  Scheduled Meds: . apixaban  5 mg Oral BID  . Chlorhexidine Gluconate Cloth  6 each Topical Daily  . feeding supplement (NEPRO CARB STEADY)  237 mL Oral Q24H  . furosemide  60 mg Intravenous BID  . iron polysaccharides  150 mg Oral Daily  . mouth rinse  15 mL Mouth Rinse BID  . melatonin  2.5 mg Oral QHS  . midodrine  10 mg Oral BID WC  . pantoprazole  40 mg Oral BID  . sodium chloride flush  3 mL Intravenous Q12H   Continuous Infusions: . sodium chloride Stopped (11/01/19 1131)  . albumin human 25 g (11/06/19 1138)  . cefTRIAXone (ROCEPHIN)  IV 2 g (11/07/19 1009)    Principal Problem:   Spontaneous bacterial peritonitis ([place]) Active Problems:   AKI (acute kidney injury) ([place])   Crack cocaine use   Acute respiratory failure with hypoxia (HCC)   LV (left ventricular) mural thrombus   Nonsustained ventricular  tachycardia (HCC)   Acute CHF (congestive heart failure) (HCC)   Cocaine abuse ([place])   Peripheral edema   Cirrhosis of liver (HCC)   Dementia (HCC)   Aspiration pneumonia of both lower lobes due to gastric secretions (HCC)   Acute diastolic (congestive) heart failure (HCC)   Cardiorenal disease   Goals of care, counseling/discussion   Palliative care by specialist   LOS: 20 days   A & P  Spontaneous bacterial peritonitis: Repeat paracentesis showed significant decrease of white cell in ascites, antibiotics effective. Hoever she still has a lot of neutrophils in 11/03/2019 fluid.  Culture has been negative. Continue Rocephin.  Acute on chronic combined systolic and diastolic congestive heart failure. Acute hypoxemic respiratory failure. Positive for continued rales at bases bilaterally.  Volume management with ultrafiltration. She is currently receiving Lasix 60 mg IV bid.   Liver cirrhosis with ascites: Noted. Continue to follow.  Acute renal failure on chronic kidney disease stage IIIb. Renal function appears to be improving, although creatinine up slightly for the last 2 days. Patient still has permacath placed. Due to patient poor response to diuretics, she will scheduled ultrafiltration as outpatient, although she does not meet criteria for the diagnosis of End Stage renal disease.Marland Kitchen  However, due to patient polysubstance abuse, the presence of a permacath presents a danger to her well being. On the other hand the patient cannot survive without dialysis. Today in the presence of Dr. Holley Raring, the patient agreed that she would avoid illicit drug use if we left the permacath in place. I explained to her that the use of illicit substances would put her life in danger, and require the removal of the dialysis catheter. She has voiced understanding.   Left ventricular thrombus with history of CVA. Patient was initially concerned for possible GI bleed, anticoagulation was on hold.  However,  there is no GI bleed observed while in the hospital.  GI was not concerned about it.  It is not safe to continue hold anticoagulation.  Anticoagulation has now been restarted.  Monitor hemoglobin.  Overgrown toenails: The patient is complaining of badly over grown toenails. I have explained to her that podiatrists are unable to clut toenails as inpatient. Nurses aren't able to cut them. The patient states that every time she has an appointment to see podiatry for them as outpatient, she is inpatient.   Anemia of chronic disease: Hemoglobin still stable.  Patient had a normal B12 level recently, but she also has mild iron deficiency.  Oral supplement.  Hypokalemia: Supplement and monitor.  I have seen and examined this patient myself. I have spent 30 minutes in her evaluation and care.  DVT prophylaxis: Eliquis Code Status: Full Family Communication: None available Disposition Plan:   Status is: Inpatient  Remains inpatient appropriate because:Inpatient level of care appropriate due to severity of illness   Dispo: The patient is from: Home  Anticipated d/c is to: SNF. Patient has been approved for nursing home placement.  Has a bed available, but is still requiring dialysis.  Anticipated d/c date is: 2 days  Patient currently is not medically stable to d/c.    Katelyn Scobee, DO [facility] Direct contact: see [email or web address]  7PM-7AM contact night coverage as above 11/08/2019, 12:53 PM  LOS: 16 days

## 2019-11-08 NOTE — Plan of Care (Signed)
  Problem: Education: Goal: Knowledge of General Education information will improve Description: Including pain rating scale, medication(s)/side effects and non-pharmacologic comfort measures Outcome: Progressing   Problem: Health Behavior/Discharge Planning: Goal: Ability to manage health-related needs will improve Outcome: Progressing   Problem: Clinical Measurements: Goal: Ability to maintain clinical measurements within normal limits will improve Outcome: Progressing Goal: Will remain free from infection Outcome: Progressing Goal: Diagnostic test results will improve Outcome: Progressing Goal: Respiratory complications will improve Outcome: Progressing Goal: Cardiovascular complication will be avoided Outcome: Progressing   Problem: Activity: Goal: Risk for activity intolerance will decrease Outcome: Progressing   Problem: Nutrition: Goal: Adequate nutrition will be maintained Outcome: Progressing   Problem: Nutrition: Goal: Adequate nutrition will be maintained Outcome: Progressing   Problem: Coping: Goal: Level of anxiety will decrease Outcome: Progressing   Problem: Pain Managment: Goal: General experience of comfort will improve Outcome: Progressing   Problem: Safety: Goal: Ability to remain free from injury will improve Outcome: Progressing   Problem: Skin Integrity: Goal: Risk for impaired skin integrity will decrease Outcome: Progressing   Problem: Education: Goal: Ability to demonstrate management of disease process will improve Outcome: Progressing Goal: Ability to verbalize understanding of medication therapies will improve Outcome: Progressing Goal: Individualized Educational Video(s) Outcome: Progressing   Problem: Activity: Goal: Capacity to carry out activities will improve Outcome: Progressing   Problem: Cardiac: Goal: Ability to achieve and maintain adequate cardiopulmonary perfusion will improve Outcome: Progressing   Problem:  Education: Goal: Knowledge of disease and its progression will improve Outcome: Progressing Goal: Individualized Educational Video(s) Outcome: Progressing   Problem: Fluid Volume: Goal: Compliance with measures to maintain balanced fluid volume will improve Outcome: Progressing   Problem: Health Behavior/Discharge Planning: Goal: Ability to manage health-related needs will improve Outcome: Progressing   Problem: Nutritional: Goal: Ability to make healthy dietary choices will improve Outcome: Progressing

## 2019-11-09 DIAGNOSIS — Z515 Encounter for palliative care: Secondary | ICD-10-CM | POA: Diagnosis not present

## 2019-11-09 DIAGNOSIS — Z7189 Other specified counseling: Secondary | ICD-10-CM | POA: Diagnosis not present

## 2019-11-09 DIAGNOSIS — I5041 Acute combined systolic (congestive) and diastolic (congestive) heart failure: Secondary | ICD-10-CM | POA: Diagnosis not present

## 2019-11-09 DIAGNOSIS — I1310 Hypertensive heart and chronic kidney disease without heart failure, with stage 1 through stage 4 chronic kidney disease, or unspecified chronic kidney disease: Secondary | ICD-10-CM

## 2019-11-09 LAB — CBC WITH DIFFERENTIAL/PLATELET
Abs Immature Granulocytes: 0.04 10*3/uL (ref 0.00–0.07)
Basophils Absolute: 0.1 10*3/uL (ref 0.0–0.1)
Basophils Relative: 1 %
Eosinophils Absolute: 0.3 10*3/uL (ref 0.0–0.5)
Eosinophils Relative: 4 %
HCT: 25.1 % — ABNORMAL LOW (ref 36.0–46.0)
Hemoglobin: 8.1 g/dL — ABNORMAL LOW (ref 12.0–15.0)
Immature Granulocytes: 1 %
Lymphocytes Relative: 13 %
Lymphs Abs: 0.8 10*3/uL (ref 0.7–4.0)
MCH: 23 pg — ABNORMAL LOW (ref 26.0–34.0)
MCHC: 32.3 g/dL (ref 30.0–36.0)
MCV: 71.3 fL — ABNORMAL LOW (ref 80.0–100.0)
Monocytes Absolute: 1.1 10*3/uL — ABNORMAL HIGH (ref 0.1–1.0)
Monocytes Relative: 18 %
Neutro Abs: 4.1 10*3/uL (ref 1.7–7.7)
Neutrophils Relative %: 63 %
Platelets: 491 10*3/uL — ABNORMAL HIGH (ref 150–400)
RBC: 3.52 MIL/uL — ABNORMAL LOW (ref 3.87–5.11)
RDW: 19.8 % — ABNORMAL HIGH (ref 11.5–15.5)
WBC: 6.5 10*3/uL (ref 4.0–10.5)
nRBC: 0 % (ref 0.0–0.2)

## 2019-11-09 LAB — BASIC METABOLIC PANEL
Anion gap: 8 (ref 5–15)
BUN: 36 mg/dL — ABNORMAL HIGH (ref 8–23)
CO2: 28 mmol/L (ref 22–32)
Calcium: 8.6 mg/dL — ABNORMAL LOW (ref 8.9–10.3)
Chloride: 94 mmol/L — ABNORMAL LOW (ref 98–111)
Creatinine, Ser: 2.9 mg/dL — ABNORMAL HIGH (ref 0.44–1.00)
GFR, Estimated: 18 mL/min — ABNORMAL LOW (ref >60)
Glucose, Bld: 104 mg/dL — ABNORMAL HIGH (ref 70–99)
Potassium: 3.8 mmol/L (ref 3.5–5.1)
Sodium: 130 mmol/L — ABNORMAL LOW (ref 135–145)

## 2019-11-09 MED ORDER — RENA-VITE PO TABS
1.0000 | ORAL_TABLET | Freq: Every day | ORAL | Status: DC
  Administered 2019-11-09 – 2020-01-28 (×69): 1 via ORAL
  Filled 2019-11-09 (×82): qty 1

## 2019-11-09 NOTE — Progress Notes (Signed)
Palliative:  64 y.o. 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 Hospitalization complicated by need for dialysis and aspiration pneumonia. She did require dialysis during hospitalization but tolerated poorly with hypotension.   I met today with Amna. No family/visitors at bedside. She is more alert and oriented although with extremely poor insight and judgement into her disease process. I attempted to elicit goals of care anddesired surrogate decision maker. We reviewed declined EF 25-30% and Neesa was shocked. We discussed natural trajectory of CHF and Gayatri states "so I am going to die?!" I explained that this disease will lead to her death as it is incurable and worsens over time. We discussed code status and Sheyanne is unsure what she would want and tells me that she will need more time to consider if she would or wouldn't want resuscitative events. We discussed decision making if she is too ill to do this herself and she very quickly tells me that she does NOT want [name] to make decisions or have information about her but she would trust [name]'s son, Karma Greaser, to make decisions for her.   As of now she is hopeful for improvement and willing to go to SNF rehab BUT she talks about needing to have her car and being able to come and go from the facility. I told her that this is unlikely to be allowed and that I am unsure she is strong enough to do this right now. She talks much of needing access to outside so she can come and go and "get some fresh air" but she does say she is willing for facility placement as she has nowhere else to go.   Aleysia is  alert and oriented x 3 but poor understanding of her health diagnoses and poor insight and judgement. I do feel she likely has decision making capacity for HCPOA. She has some vague understanding of her condition as I spent some time explaining to her but I am unsure how much of this she will retain (I will reassess tomorrow at follow up). She does seem to question if her heart is really as bad as we say. She also has difficulty staying focused and is somewhat tangential in thoughts and discussion.   All questions/concerns addressed. Emotional support provided.   Exam: Alert, oriented but poor insight, judgement, tangential thoughts. No distress. Nasal cannula oxygen. Breathing regular, unlabored. BLE trace edema.   Plan: - Ongoing goals of care conversations.  - HD on hold for now. She seems open to continued HD if needed but I am unsure how this would work long term as outpatient.  - She is considering her wishes for FULL code vs DNR.   57 min  Vinie Sill, NP Palliative Medicine Team Pager [phone] (Please see [email or web address] for schedule) Team Phone [phone]    Greater than 50%  of this time was spent counseling and coordinating care related to the above assessment and plan

## 2019-11-09 NOTE — Progress Notes (Addendum)
[facility]  ROUNDING NOTE   Subjective:   Patient appears pleasant and comfortable today.Palliative care consult in process.    Objective:  Vital signs in last 24 hours:  Temp:  [97.7 F (36.5 C)-98.2 F (36.8 C)] 97.7 F (36.5 C) (11/01 0721) Pulse Rate:  [81-98] 81 (11/01 0721) Resp:  [17-18] 17 (11/01 0721) BP: (93-120)/(70-106) 93/70 (11/01 0721) SpO2:  [100 %] 100 % (11/01 0721) Weight:  [95 kg] 95 kg (11/01 0219)  Weight change: -0.709 kg Filed Weights   11/05/19 0236 11/08/19 0644 11/09/19 0219  Weight: 95.7 kg 95.7 kg 95 kg    Intake/Output: I/O last 3 completed shifts: In: 480 [P.O.:480] Out: 0    Intake/Output this shift:  Total I/O In: 240 [P.O.:240] Out: -   Physical Exam: General:  Sitting up in bed, in no acute distress  Head  normocephalic, atraumatic  Lungs:    Lungs clear, diminished at the bases  Heart:  Regular rate and rhythm  Abdomen:   Non distended, nontender  Extremities:  Trace peripheral edema  Neurologic:  Oriented  Skin: No acute lesions or rashes  Rt IJ catheter  Basic Metabolic Panel: Recent Labs  Lab 11/04/19 0454 11/04/19 0454 11/04/19 1109 11/05/19 0516 11/05/19 0516 11/06/19 0453 11/08/19 0316 11/09/19 0418  NA 134*  --   --  135  --  133* 132* 130*  K 3.3*  --   --  3.3*  --  3.8 4.1 3.8  CL 95*  --   --  97*  --  95* 94* 94*  CO2 29  --   --  29  --  28 26 28   GLUCOSE 118*  --   --  141*  --  100* 97 104*  BUN 64*  --   --  37*  --  38* 35* 36*  CREATININE 3.24*  --   --  2.21*  --  2.48* 2.70* 2.90*  CALCIUM 8.9   < >  --  8.6*   < > 8.7* 8.7* 8.6*  PHOS  --   --  2.9  --   --  2.9  --   --    < > = values in this interval not displayed.    Liver Function Tests: No results for input(s): AST, ALT, ALKPHOS, BILITOT, PROT, ALBUMIN in the last 168 hours. No results for input(s): LIPASE, AMYLASE in the last 168 hours. Recent Labs  Lab 11/04/19 0454  AMMONIA 27    CBC: Recent Labs  Lab  11/05/19 0516 11/06/19 0453 11/07/19 0720 11/08/19 0316 11/09/19 0418  WBC 7.1 7.6 8.2 8.7 6.5  NEUTROABS 4.8 4.8 5.4 6.0 4.1  HGB 8.7* 8.4* 8.9* 8.1* 8.1*  HCT 26.0* 26.0* 27.6* 25.1* 25.1*  MCV 69.9* 71.2* 71.5* 72.5* 71.3*  PLT 372 436* 448* 458* 491*    Cardiac Enzymes: No results for input(s): CKTOTAL, CKMB, CKMBINDEX, TROPONINI in the last 168 hours.  BNP: Invalid input(s): POCBNP  CBG: Recent Labs  Lab 11/05/19 1610  RUEAVW 09    Microbiology: Results for orders placed or performed during the hospital encounter of 10/19/19  Respiratory Panel by RT PCR (Flu A&B, Covid) - Nasopharyngeal Swab     Status: None   Collection Time: 10/19/19 11:13 PM   Specimen: Nasopharyngeal Swab  Result Value Ref Range Status   SARS Coronavirus 2 by RT PCR NEGATIVE NEGATIVE Final    Comment: (NOTE) SARS-CoV-2 target nucleic acids are NOT DETECTED.  The SARS-CoV-2 RNA  is generally detectable in upper respiratoy specimens during the acute phase of infection. The lowest concentration of SARS-CoV-2 viral copies this assay can detect is 131 copies/mL. A negative result does not preclude SARS-Cov-2 infection and should not be used as the sole basis for treatment or other patient management decisions. A negative result may occur with  improper specimen collection/handling, submission of specimen other than nasopharyngeal swab, presence of viral mutation(s) within the areas targeted by this assay, and inadequate number of viral copies (<131 copies/mL). A negative result must be combined with clinical observations, patient history, and epidemiological information. The expected result is Negative.  Fact Sheet for Patients:  PinkCheek.be  Fact Sheet for Healthcare Providers:  GravelBags.it  This test is no t yet approved or cleared by the [place] FDA and  has been authorized for detection and/or diagnosis of SARS-CoV-2  by FDA under an Emergency Use Authorization (EUA). This EUA will remain  in effect (meaning this test can be used) for the duration of the COVID-19 declaration under Section 564(b)(1) of the Act, 21 U.S.C. section 360bbb-3(b)(1), unless the authorization is terminated or revoked sooner.     Influenza A by PCR NEGATIVE NEGATIVE Final   Influenza B by PCR NEGATIVE NEGATIVE Final    Comment: (NOTE) The Xpert Xpress SARS-CoV-2/FLU/RSV assay is intended as an aid in  the diagnosis of influenza from Nasopharyngeal swab specimens and  should not be used as a sole basis for treatment. Nasal washings and  aspirates are unacceptable for Xpert Xpress SARS-CoV-2/FLU/RSV  testing.  Fact Sheet for Patients: PinkCheek.be  Fact Sheet for Healthcare Providers: GravelBags.it  This test is not yet approved or cleared by the [place] FDA and  has been authorized for detection and/or diagnosis of SARS-CoV-2 by  FDA under an Emergency Use Authorization (EUA). This EUA will remain  in effect (meaning this test can be used) for the duration of the  Covid-19 declaration under Section 564(b)(1) of the Act, 21  U.S.C. section 360bbb-3(b)(1), unless the authorization is  terminated or revoked. Performed at [facility], [address] [place], [place] [address]   MRSA PCR Screening     Status: None   Collection Time: 10/25/19  2:50 PM   Specimen: Nasopharyngeal  Result Value Ref Range Status   MRSA by PCR NEGATIVE NEGATIVE Final    Comment:        The GeneXpert MRSA Assay (FDA approved for NASAL specimens only), is one component of a comprehensive MRSA colonization surveillance program. It is not intended to diagnose MRSA infection nor to guide or monitor treatment for MRSA infections. Performed at [facility], [address] [place], [place] [address]   CULTURE, BLOOD (ROUTINE X 2) w Reflex to ID Panel      Status: None   Collection Time: 10/25/19  4:29 PM   Specimen: BLOOD  Result Value Ref Range Status   Specimen Description BLOOD CENTRAL LINE  Final   Special Requests   Final    BOTTLES DRAWN AEROBIC AND ANAEROBIC Blood Culture adequate volume   Culture   Final    NO GROWTH 5 DAYS Performed at [facility], [address] [place], [place] [address]    Report Status 10/30/2019 FINAL  Final  CULTURE, BLOOD (ROUTINE X 2) w Reflex to ID Panel     Status: None   Collection Time: 10/26/19  6:45 AM   Specimen: BLOOD  Result Value Ref Range Status   Specimen Description BLOOD BLOOD  LEFT HAND  Final   Special Requests   Final    BOTTLES DRAWN AEROBIC AND ANAEROBIC Blood Culture adequate volume   Culture   Final    NO GROWTH 5 DAYS Performed at [facility], [address] [place], [place] [address]    Report Status 10/31/2019 FINAL  Final  Body fluid culture     Status: None   Collection Time: 10/26/19 11:30 AM   Specimen: Fluid  Result Value Ref Range Status   Specimen Description   Final    FLUID Performed at [facility], [address] [place], [place] [address]    Special Requests   Final    PERITONEAL Performed at [facility], [address] [place], [place] [address]    Gram Stain   Final    ABUNDANT WBC PRESENT, PREDOMINANTLY PMN NO ORGANISMS SEEN    Culture   Final    NO GROWTH Performed at [facility], [place] [address], [place], [place] [address]    Report Status 10/30/2019 FINAL  Final  Body fluid culture     Status: None   Collection Time: 10/29/19  1:39 PM   Specimen: PATH Cytology Peritoneal fluid  Result Value Ref Range Status   Specimen Description   Final    PERITONEAL Performed at [facility], [address] [place], [place] [address]    Special Requests   Final    NONE Performed at [facility], [address] [place], [place] [address]    Gram Stain   Final    MODERATE WBC  PRESENT, PREDOMINANTLY PMN NO ORGANISMS SEEN    Culture   Final    NO GROWTH 3 DAYS Performed at [facility], [place] [address] [place], [place] [address]    Report Status 11/02/2019 FINAL  Final  Gram stain     Status: None   Collection Time: 11/02/19  3:30 PM   Specimen: PATH Cytology Peritoneal fluid  Result Value Ref Range Status   Specimen Description PERITONEAL  Final   Special Requests NONE  Final   Gram Stain   Final    WBC SEEN RED BLOOD CELLS NO ORGANISMS SEEN Performed at [facility], [address] [place], [place] [address]    Report Status 11/02/2019 FINAL  Final    Coagulation Studies: No results for input(s): LABPROT, INR in the last 72 hours.  Urinalysis: No results for input(s): COLORURINE, LABSPEC, PHURINE, GLUCOSEU, HGBUR, BILIRUBINUR, KETONESUR, PROTEINUR, UROBILINOGEN, NITRITE, LEUKOCYTESUR in the last 72 hours.  Invalid input(s): APPERANCEUR    Imaging: No results found.   Medications:    sodium chloride Stopped (11/01/19 1131)   albumin human 25 g (11/06/19 1138)   cefTRIAXone (ROCEPHIN)  IV 2 g (11/09/19 0853)    apixaban  5 mg Oral BID   Chlorhexidine Gluconate Cloth  6 each Topical Daily   feeding supplement (NEPRO CARB STEADY)  237 mL Oral Q24H   furosemide  60 mg Intravenous BID   iron polysaccharides  150 mg Oral Daily   mouth rinse  15 mL Mouth Rinse BID   melatonin  2.5 mg Oral QHS   midodrine  10 mg Oral BID WC   pantoprazole  40 mg Oral BID   sodium chloride flush  3 mL Intravenous Q12H   sodium chloride, acetaminophen, albumin human, albuterol, alum & mag hydroxide-simeth **AND** lidocaine, bisacodyl, calcium carbonate, Glycerin (Adult), loperamide, ondansetron (ZOFRAN) IV, ondansetron (ZOFRAN) IV, oxyCODONE-acetaminophen, sodium  chloride flush  Assessment/ Plan:  [name] is a 64 y.o. black female with congestive heart failure, hypertension, hyperlipidemia, gout, COPD, anemia, cocaine  abuse who is admitted to [facility] on 10/19/2019 for Peripheral edema [R60.9] Acute CHF (congestive heart failure) ([place]) [I50.9] AKI (acute kidney injury) ([place]) [N17.9] Congestive heart failure, unspecified HF chronicity, unspecified heart failure type ([place]) [W03.7] Acute diastolic (congestive) heart failure ([place]) [I50.31]  1. Acute kidney injury on chronic kidney disease stage IIIB. Baseline creatinine of 1.66, GFR of 32. With history of proteinuria.  Chronic kidney disease secondary to hypertension  Acute renal failure secondary to acute cardiorenal syndrome   Lab Results  Component Value Date   CREATININE 2.90 (H) 11/09/2019   CREATININE 2.70 (H) 11/08/2019   CREATININE 2.48 (H) 11/06/2019   Patient reports voiding 'fine' We will monitor urine output closely Patient is not willing to use Pure Wick,external drainage system,may try to measure urine output using bedside commode. No acute indication for dialysis today, will continue to assess daily  2. Hypotension with acute exacerbation of systolic and diastolic congestive heart failure. Echocardiogram 06/22/19 EF of 25-30%.  BP stays soft Patient is on Midodrine  4. Anemia of Chronic Disease Hemoglobin stays low, but stable at 8.1 for the past 2 days    LOS: [address] 11/1/202112:31 PM    Patient was seen and examined with Crosby Oyster, DNP.  Above plan was discussed and agreed upon on the signing of this note.   Lavonia Dana, MD [facility]  11/1/20213:50 PM

## 2019-11-09 NOTE — Progress Notes (Signed)
PT Cancellation Note  Patient Details Name: Katelyn Lane MRN: [id number] DOB: 05/14/1955   Cancelled Treatment:    Reason Eval/Treat Not Completed: Other (comment)   Attempted therapy session this pm.  Pt stated she needed to make a "business call" and waved me away.  Told me to come back in a couple minutes.  Went to get portable O2 tank and give her time.  When I returned she again waved me off.  Will attempt again as time allows today.    Chesley Noon 11/09/2019, 1:20 PM

## 2019-11-09 NOTE — Progress Notes (Signed)
Ch visited with Katelyn Lane in response to OR for AD. Katelyn Lane was glad to see Ch, requested prayer for health. Ch checked in on her about AD, and Katelyn Lane said she does not want to complete HPOA. Ch left AD packet for Katelyn Lane to look over. Ch prayed with Katelyn Lane. Katelyn Lane requested ice chips; Ch let RNs know about Katelyn Lane requesting ice.

## 2019-11-09 NOTE — Plan of Care (Signed)
  Problem: Education: Goal: Knowledge of General Education information will improve Description Including pain rating scale, medication(s)/side effects and non-pharmacologic comfort measures Outcome: Progressing   

## 2019-11-09 NOTE — Progress Notes (Signed)
Nutrition Follow Up Note   DOCUMENTATION CODES:   Obesity unspecified  INTERVENTION:   Nepro Shake po daily, each supplement provides 425 kcal and 19 grams protein  Rena-vit daily   NUTRITION DIAGNOSIS:   Increased nutrient needs related to chronic illness (cirrhosis, CHF) as evidenced by increased estimated needs.  GOAL:   Patient will meet greater than or equal to 90% of their needs  -progressing   MONITOR:   PO intake, Supplement acceptance, Labs, Weight trends, Skin, I & O's  ASSESSMENT:   64 y.o. 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  Pt s/p HD initiation 10/17 Pt s/p multiple paracentesis'   Pt continues to have good appetite and oral intake; pt eating 100% of meals in hospital. Pt continues to complain about the hospital food. Pt is refusing most of the Nepro supplements that are ordered for once daily per her request. Pt restarted on HD. RD will add rena-vit to help replace losses from HD. Recommend encourage supplements to help pt meet her estimated needs. Palliative care is following. Plan is for SNF at discharge.   Per chart, pt has remained weight stable for ~1 week.   Medications reviewed and include: lasix, iron polysaccharides, melatonin, protonix  Labs reviewed: Na 130(L), K 3.8 wnl, BUN 36(H), creat 2.90(H) Wbc- Hgb 8.1(L), Hct 25.1(L), MCV 71.3(L), MCH 23.0(L)  Diet Order:   Diet Order            Diet 2 gram sodium Room service appropriate? Yes; Fluid consistency: Thin; Fluid restriction: 1500 mL Fluid  Diet effective now                EDUCATION NEEDS:   Education needs have been addressed  Skin:  Skin Assessment: Reviewed RN Assessment (non-pressure injury L leg)  Last BM:  11/1- TYPE  6  Height:   Ht Readings from Last 1 Encounters:  10/26/19 5\' 6"  (1.676 m)    Weight:   Wt Readings from Last 1 Encounters:  11/09/19 95 kg    Ideal Body Weight:  59 kg  BMI:  Body mass index is 33.8 kg/m.  Estimated Nutritional Needs:   Kcal:  1900-2200kcal/day  Protein:  95-110g/day  Fluid:  1.8-2L/day  Koleen Distance MS, RD, LDN Please refer to [facility] for RD and/or RD on-call/weekend/after hours pager

## 2019-11-09 NOTE — Progress Notes (Signed)
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 Found to have BNP greater than 4500, elevated troponin and AKI.  Multiple prior admissions with similar symptoms and complaints. Prolonged hospitalization in August 2021 when she was discharged to SNF. Not clear when she get out of SNF. Continues to complain that she is homeless but per social worker she do have a house. Not take her medication despite being provided with medical management.  After admission to the hospital, she was diagnosed with acute on chronic combinedsystolicanddiastoliccongestiveheartfailurewith ejection fraction 25 to 30%. She was not responding to IV Lasix. She was seen by nephrology, temporary dialysis catheter was performed and she was dialyzed on 10/17 and 10/18. Patient also complaining of abdominal pain, she had a paracentesis, study showed possible spontaneous peritonitis, she was treated with Rocephin and Flagyl.  Patient had a worsening respiratory status on 10/17, was diagnosed with aspiration pneumonia. She was treated with Rocephin, Zithromax and Flagyl. Patient also had abdominal pain and a CT scan suspect ischemia, but seen by general surgery on 10/19, no need for surgery.  10/21.Repeat paracentesis performed, antibiotic changed to Rocephin only. Permacath placed.  10/22.Repeated paracentesis showed white cell count of 3048, 69% neutrophils. However, patient clinically improving. Continue Rocephin  10/26.  Repeated paracentesis showed white cell count of 1636.   10/27: The patient was unable to  tolerate ultrafiltration due to hypotension.   11/09/2019: no acute need for dialysis now. Palliative care has been consulted and is talking with the patient. Consultants  . Gastroenterology . Psychiatry . General Surgery . Vascular surgery . Cardiology . Nephrology  Procedures  . Paracentesis x 3 . Placement of temporary dialysis catheter. . Dialysis  Antibiotics   Anti-infectives (From admission, onward)   Start     Dose/Rate Route Frequency Ordered Stop   11/01/19 1000  cefTRIAXone (ROCEPHIN) 2 g in sodium chloride 0.9 % 100 mL IVPB  Status:  Discontinued        2 g 200 mL/hr over 30 Minutes Intravenous Daily 10/31/19 1521 11/09/19 1425   10/27/19 1000  cefTRIAXone (ROCEPHIN) 2 g in sodium chloride 0.9 % 100 mL IVPB  Status:  Discontinued        2 g 200 mL/hr over 30 Minutes Intravenous Daily 10/26/19 1559 10/31/19 1521   10/26/19 2000  cefTRIAXone (ROCEPHIN) 1 g in sodium chloride 0.9 % 100 mL IVPB        1 g 200 mL/hr over 30 Minutes Intravenous  Once 10/26/19 1559 10/26/19 2024   10/26/19 1600  cefTRIAXone (ROCEPHIN) 2 g in sodium chloride 0.9 % 100 mL IVPB  Status:  Discontinued        2 g 200 mL/hr over 30 Minutes Intravenous Every 24 hours 10/26/19 1351 10/26/19 1600   10/26/19 1000  metroNIDAZOLE (FLAGYL) IVPB 500 mg  Status:  Discontinued        500 mg 100 mL/hr over 60 Minutes Intravenous Every 8 hours 10/26/19 0909 10/29/19 1138   10/25/19 1445  azithromycin (ZITHROMAX) 500 mg in sodium chloride 0.9 % 250 mL IVPB  Status:  Discontinued        500 mg 250 mL/hr over 60 Minutes Intravenous Every 24 hours 10/25/19  1353 10/29/19 1138   10/25/19 1230  azithromycin (ZITHROMAX) tablet 500 mg  Status:  Discontinued        500 mg Oral Daily 10/25/19 1133 10/25/19 1134   10/25/19 1030  cefTRIAXone (ROCEPHIN) 1 g in sodium chloride 0.9 % 100 mL IVPB  Status:  Discontinued        1 g 200 mL/hr over 30 Minutes Intravenous Every 12 hours 10/25/19 0931 10/26/19 1351      Subjective  The patient is resting quietly. No new complaints.  Objective   Vitals:  Vitals:   11/09/19 0721 11/09/19 1500  BP: 93/70   Pulse: 81   Resp: 17   Temp: 97.7 F (36.5 C)   SpO2: 100% 99%   Exam:  Constitutional:  . The patient is awake, alert, and oriented x 3. No acute distress. Respiratory:  . No increased work of breathing. . No wheezes, rales, or rhonchi . No tactile fremitus Cardiovascular:  . Regular rate and rhythm . No murmurs, ectopy, or gallups. . No lateral PMI. No thrills. Abdomen:  . Abdomen is soft, non-tender, non-distended . No hernias, masses, or organomegaly . Normoactive bowel sounds.  Musculoskeletal:  . No cyanosis, clubbing, or edema Skin:  . No rashes, lesions, ulcers . palpation of skin: no induration or nodules Neurologic:  . CN 2-12 intact . Sensation all 4 extremities intact Psychiatric:  . Mental status o Mood, affect appropriate o Orientation to person, place, time  . judgment and insight appear intact I have personally reviewed the following:   Today's Data  . Vitals, BMP, CBC  Lab Data  . Pathology review of peritoneal fluid with cytospin. No malignant cells. Abundant inflammation. 1342 neutrophils.  Micro Data  . Peritoneal fluid: No growth.  Scheduled Meds: . apixaban  5 mg Oral BID  . Chlorhexidine Gluconate Cloth  6 each Topical Daily  . feeding supplement (NEPRO CARB STEADY)  237 mL Oral Q24H  . furosemide  60 mg Intravenous BID  . iron polysaccharides  150 mg Oral Daily  . mouth rinse  15 mL Mouth Rinse BID  . melatonin  2.5 mg Oral QHS  . midodrine  10 mg Oral BID WC  . pantoprazole  40 mg Oral BID  . sodium chloride flush  3 mL Intravenous Q12H   Continuous Infusions: . sodium chloride Stopped (11/01/19 1131)  . albumin human 25 g (11/06/19 1138)    Principal Problem:   Spontaneous bacterial peritonitis ([place]) Active Problems:   AKI (acute kidney injury) ([place])   Crack cocaine use   Acute  respiratory failure with hypoxia (HCC)   LV (left ventricular) mural thrombus   Nonsustained ventricular tachycardia (HCC)   Acute CHF (congestive heart failure) (HCC)   Cocaine abuse ([place])   Peripheral edema   Cirrhosis of liver (HCC)   Dementia (HCC)   Aspiration pneumonia of both lower lobes due to gastric secretions (HCC)   Acute diastolic (congestive) heart failure (HCC)   Cardiorenal disease   Goals of care, counseling/discussion   Palliative care by specialist   LOS: 21 days   A & P  Spontaneous bacterial peritonitis: Repeat paracentesis showed significant decrease of white cell in ascites, antibiotics effective. Hoever she still has a lot of neutrophils in 11/03/2019 fluid.  Culture has been negative. Rocephin has been discontinued. Monitor.  Acute on chronic combined systolic and diastolic congestive heart failure. Acute hypoxemic respiratory failure. Positive for continued rales at bases bilaterally.  Volume management with ultrafiltration. She is  currently receiving Lasix 60 mg IV bid. Palliative care has been consulted.  Liver cirrhosis with ascites: Noted. Continue to follow.  Acute renal failure on chronic kidney disease stage IIIb: Due to cardiorenal syndrome.  Renal function appears to be improving, although creatinine up slightly for the last 2 days. Patient still has permacath placed. Due to patient poor response to diuretics, she will scheduled ultrafiltration as outpatient, although she does not meet criteria for the diagnosis of End Stage renal disease.Marland Kitchen  However, due to patient polysubstance abuse, the presence of a permacath presents a danger to her well being. On the other hand the patient cannot survive without dialysis. Today in the presence of Dr. Holley Raring, the patient agreed that she would avoid illicit drug use if we left the permacath in place. I explained to her that the use of illicit substances would put her life in danger, and require the removal of the  dialysis catheter. She has voiced understanding.   Left ventricular thrombus with history of CVA. Patient was initially concerned for possible GI bleed, anticoagulation was on hold.  However, there is no GI bleed observed while in the hospital.  GI was not concerned about it.  It is not safe to continue hold anticoagulation.  Anticoagulation has now been restarted.  Monitor hemoglobin.  Overgrown toenails: The patient is complaining of badly over grown toenails. I have explained to her that podiatrists are unable to clut toenails as inpatient. Nurses aren't able to cut them. The patient states that every time she has an appointment to see podiatry for them as outpatient, she is inpatient.   Anemia of chronic disease: Hemoglobin still stable.  Patient had a normal B12 level recently, but she also has mild iron deficiency.  Oral supplement.  Hypokalemia: Supplement and monitor.  I have seen and examined this patient myself. I have spent 32 minutes in her evaluation and care.  DVT prophylaxis: Eliquis Code Status: Full Family Communication: None available Disposition Plan:   Status is: Inpatient  Remains inpatient appropriate because:Inpatient level of care appropriate due to severity of illness   Dispo: The patient is from: Home  Anticipated d/c is to: SNF. Patient has been approved for nursing home placement.  Has a bed available, but is still requiring dialysis.  Anticipated d/c date is: 2 days  Patient currently is not medically stable to d/c.    Tredarius Cobern, DO [facility] Direct contact: see [email or web address]  7PM-7AM contact night coverage as above 11/09/2019, 12:53 PM  LOS: 16 days

## 2019-11-10 DIAGNOSIS — K652 Spontaneous bacterial peritonitis: Secondary | ICD-10-CM | POA: Diagnosis not present

## 2019-11-10 DIAGNOSIS — Z515 Encounter for palliative care: Secondary | ICD-10-CM | POA: Diagnosis not present

## 2019-11-10 DIAGNOSIS — I1310 Hypertensive heart and chronic kidney disease without heart failure, with stage 1 through stage 4 chronic kidney disease, or unspecified chronic kidney disease: Secondary | ICD-10-CM | POA: Diagnosis not present

## 2019-11-10 DIAGNOSIS — Z7189 Other specified counseling: Secondary | ICD-10-CM | POA: Diagnosis not present

## 2019-11-10 LAB — BASIC METABOLIC PANEL
Anion gap: 9 (ref 5–15)
BUN: 39 mg/dL — ABNORMAL HIGH (ref 8–23)
CO2: 27 mmol/L (ref 22–32)
Calcium: 8.5 mg/dL — ABNORMAL LOW (ref 8.9–10.3)
Chloride: 92 mmol/L — ABNORMAL LOW (ref 98–111)
Creatinine, Ser: 3.09 mg/dL — ABNORMAL HIGH (ref 0.44–1.00)
GFR, Estimated: 16 mL/min — ABNORMAL LOW (ref >60)
Glucose, Bld: 104 mg/dL — ABNORMAL HIGH (ref 70–99)
Potassium: 3.8 mmol/L (ref 3.5–5.1)
Sodium: 128 mmol/L — ABNORMAL LOW (ref 135–145)

## 2019-11-10 LAB — CBC WITH DIFFERENTIAL/PLATELET
Abs Immature Granulocytes: 0.02 10*3/uL (ref 0.00–0.07)
Basophils Absolute: 0.1 10*3/uL (ref 0.0–0.1)
Basophils Relative: 1 %
Eosinophils Absolute: 0.4 10*3/uL (ref 0.0–0.5)
Eosinophils Relative: 6 %
HCT: 24.8 % — ABNORMAL LOW (ref 36.0–46.0)
Hemoglobin: 7.8 g/dL — ABNORMAL LOW (ref 12.0–15.0)
Immature Granulocytes: 0 %
Lymphocytes Relative: 13 %
Lymphs Abs: 0.8 10*3/uL (ref 0.7–4.0)
MCH: 22.9 pg — ABNORMAL LOW (ref 26.0–34.0)
MCHC: 31.5 g/dL (ref 30.0–36.0)
MCV: 72.9 fL — ABNORMAL LOW (ref 80.0–100.0)
Monocytes Absolute: 0.9 10*3/uL (ref 0.1–1.0)
Monocytes Relative: 15 %
Neutro Abs: 4.1 10*3/uL (ref 1.7–7.7)
Neutrophils Relative %: 65 %
Platelets: 508 10*3/uL — ABNORMAL HIGH (ref 150–400)
RBC: 3.4 MIL/uL — ABNORMAL LOW (ref 3.87–5.11)
RDW: 19.6 % — ABNORMAL HIGH (ref 11.5–15.5)
WBC: 6.2 10*3/uL (ref 4.0–10.5)
nRBC: 0 % (ref 0.0–0.2)

## 2019-11-10 MED ORDER — MIDODRINE HCL 5 MG PO TABS
10.0000 mg | ORAL_TABLET | Freq: Three times a day (TID) | ORAL | Status: DC
  Administered 2019-11-10 – 2020-01-29 (×202): 10 mg via ORAL
  Filled 2019-11-10 (×205): qty 2

## 2019-11-10 MED ORDER — FUROSEMIDE 10 MG/ML IJ SOLN
20.0000 mg | Freq: Two times a day (BID) | INTRAMUSCULAR | Status: DC
  Administered 2019-11-10 – 2019-11-13 (×7): 20 mg via INTRAVENOUS
  Filled 2019-11-10 (×6): qty 4

## 2019-11-10 NOTE — Progress Notes (Signed)
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 Hospitalization complicated by need for dialysis and aspiration pneumonia. She did require dialysis during hospitalization but tolerated poorly with hypotension.  I came by to see Katelyn Lane. She has been more confused this afternoon per RN (she does become more confused the later it gets). Katelyn Lane is fast asleep and when I awake her on Friday she was very confused and was not willing to speak with me so I did not awaken her today.   I did reach out to her sister, Katelyn Lane. She told me yesterday that she did not want Katelyn Lane to speak with [name] but trusted [name]'s son Katelyn Lane but I do not have his contact information. I spoke further with Katelyn Lane who has been in contact with Katelyn Lane but says that Maven does not share any information regarding her health. Katelyn Lane references that [name] likely knows more about [name]'s situation but they do not always get along. I did explain to [place] about Katelyn Lane's diagnosis with cardiorenal and the difficult situation to care for her as her heart failure and renal failure are both progressive and terminal illnesses.   I explained that we have provided Suanne Lane with a few dialysis treatments but I question if Katelyn Lane would benefit from continuing dialysis in the future as she is unlikely to commit to dialysis as needed for it to actually be helpful for her and the risks may outweigh the benefits. She also has not tolerated dialysis well with past attempts. Katelyn Lane understands and is tearful learning that Katelyn Lane is so ill. She shares that Katelyn Lane and Katelyn Lane have tried to  help Katelyn Lane. She is sad and feels like if Katelyn Lane had not left nursing facility previously that maybe she would have done better a little longer.   We discussed that Katelyn Lane does not have the capacity for complex medical decisions and Katelyn Lane understands and agrees. Katelyn Lane agrees to optimize medically but not to pursue long term dialysis. She agrees with DNR. She wants Katelyn Lane to continue to support Katelyn Lane to help her be as happy and comfortable as possible. She will speak with the rest of the family about this situation as well. I did mention that there may be a role for hospice in the near future. I will continue to follow and support Katelyn Lane and Katelyn Lane.   All questions/concerns addressed. Emotional support provided. Updated Dr. Benny Lane and Dr. Juleen Lane.   Exam: Sleeping comfortably. I did not awaken.   Plan: - DNR decided. I requested that RN not place DNR bracelet given her fluctuating mentation/confusion and this could cause anxiety and make her upset. (Psych consult 10/21 determines that she has impaired insight, judgement, and cognition impacting decision making capacity).  - Optimize with medications but no plans for long term dialysis per discussion with family.   [place], NP Palliative Medicine Team Pager [phone] (Please see [email or web address] for schedule) Team Phone [phone]    Greater than 50%  of this time was spent counseling and coordinating care related to the above assessment and plan

## 2019-11-10 NOTE — Progress Notes (Signed)
[facility]  ROUNDING NOTE   Subjective:   Patient sitting up in bed, appears comfortable.Patient has bedside commode at the bedside, reports voiding well.   Objective:  Vital signs in last 24 hours:  Temp:  [97.5 F (36.4 C)-98.9 F (37.2 C)] 97.6 F (36.4 C) (11/02 1223) Pulse Rate:  [83-89] 88 (11/02 1223) Resp:  [16-20] 16 (11/02 1223) BP: (85-100)/(61-80) 98/80 (11/02 1223) SpO2:  [99 %-100 %] 100 % (11/02 1223)  Weight change:  Filed Weights   11/05/19 0236 11/08/19 0644 11/09/19 0219  Weight: 95.7 kg 95.7 kg 95 kg    Intake/Output: I/O last 3 completed shifts: In: 360 [P.O.:360] Out: -    Intake/Output this shift:  Total I/O In: -  Out: 100 [Urine:100]  Physical Exam: General:  In no acute distress  Head  Oral mucous membranes moist  Lungs:    Respirations even,unlabored, lungs with wheezes bilaterally  Heart:  S1S2,no rubs or gallops  Abdomen:   Non distended, nontender  Extremities:  1+ peripheral edema  Neurologic:  Speech clear and appropriate  Skin: No acute lesions or rashes  Rt IJ catheter  Basic Metabolic Panel: Recent Labs  Lab 11/04/19 0454 11/04/19 1109 11/05/19 0516 11/05/19 0516 11/06/19 0453 11/06/19 0453 11/08/19 0316 11/09/19 0418 11/10/19 0409  NA   < >  --  135  --  133*  --  132* 130* 128*  K   < >  --  3.3*  --  3.8  --  4.1 3.8 3.8  CL   < >  --  97*  --  95*  --  94* 94* 92*  CO2   < >  --  29  --  28  --  26 28 27   GLUCOSE   < >  --  141*  --  100*  --  97 104* 104*  BUN   < >  --  37*  --  38*  --  35* 36* 39*  CREATININE   < >  --  2.21*  --  2.48*  --  2.70* 2.90* 3.09*  CALCIUM   < >  --  8.6*   < > 8.7*   < > 8.7* 8.6* 8.5*  PHOS  --  2.9  --   --  2.9  --   --   --   --    < > = values in this interval not displayed.    Liver Function Tests: No results for input(s): AST, ALT, ALKPHOS, BILITOT, PROT, ALBUMIN in the last 168 hours. No results for input(s): LIPASE, AMYLASE in the last 168  hours. Recent Labs  Lab 11/04/19 0454  AMMONIA 27    CBC: Recent Labs  Lab 11/06/19 0453 11/07/19 0720 11/08/19 0316 11/09/19 0418 11/10/19 0409  WBC 7.6 8.2 8.7 6.5 6.2  NEUTROABS 4.8 5.4 6.0 4.1 4.1  HGB 8.4* 8.9* 8.1* 8.1* 7.8*  HCT 26.0* 27.6* 25.1* 25.1* 24.8*  MCV 71.2* 71.5* 72.5* 71.3* 72.9*  PLT 436* 448* 458* 491* 508*    Cardiac Enzymes: No results for input(s): CKTOTAL, CKMB, CKMBINDEX, TROPONINI in the last 168 hours.  BNP: Invalid input(s): POCBNP  CBG: Recent Labs  Lab 11/05/19 2637  CHYIFO 27    Microbiology: Results for orders placed or performed during the hospital encounter of 10/19/19  Respiratory Panel by RT PCR (Flu A&B, Covid) - Nasopharyngeal Swab     Status: None   Collection Time: 10/19/19 11:13 PM   Specimen:  Nasopharyngeal Swab  Result Value Ref Range Status   SARS Coronavirus 2 by RT PCR NEGATIVE NEGATIVE Final    Comment: (NOTE) SARS-CoV-2 target nucleic acids are NOT DETECTED.  The SARS-CoV-2 RNA is generally detectable in upper respiratoy specimens during the acute phase of infection. The lowest concentration of SARS-CoV-2 viral copies this assay can detect is 131 copies/mL. A negative result does not preclude SARS-Cov-2 infection and should not be used as the sole basis for treatment or other patient management decisions. A negative result may occur with  improper specimen collection/handling, submission of specimen other than nasopharyngeal swab, presence of viral mutation(s) within the areas targeted by this assay, and inadequate number of viral copies (<131 copies/mL). A negative result must be combined with clinical observations, patient history, and epidemiological information. The expected result is Negative.  Fact Sheet for Patients:  PinkCheek.be  Fact Sheet for Healthcare Providers:  GravelBags.it  This test is no t yet approved or cleared by the [place] FDA and  has been authorized for detection and/or diagnosis of SARS-CoV-2 by FDA under an Emergency Use Authorization (EUA). This EUA will remain  in effect (meaning this test can be used) for the duration of the COVID-19 declaration under Section 564(b)(1) of the Act, 21 U.S.C. section 360bbb-3(b)(1), unless the authorization is terminated or revoked sooner.     Influenza A by PCR NEGATIVE NEGATIVE Final   Influenza B by PCR NEGATIVE NEGATIVE Final    Comment: (NOTE) The Xpert Xpress SARS-CoV-2/FLU/RSV assay is intended as an aid in  the diagnosis of influenza from Nasopharyngeal swab specimens and  should not be used as a sole basis for treatment. Nasal washings and  aspirates are unacceptable for Xpert Xpress SARS-CoV-2/FLU/RSV  testing.  Fact Sheet for Patients: PinkCheek.be  Fact Sheet for Healthcare Providers: GravelBags.it  This test is not yet approved or cleared by the [place] FDA and  has been authorized for detection and/or diagnosis of SARS-CoV-2 by  FDA under an Emergency Use Authorization (EUA). This EUA will remain  in effect (meaning this test can be used) for the duration of the  Covid-19 declaration under Section 564(b)(1) of the Act, 21  U.S.C. section 360bbb-3(b)(1), unless the authorization is  terminated or revoked. Performed at [facility], [address] [place], [place] [address]   MRSA PCR Screening     Status: None   Collection Time: 10/25/19  2:50 PM   Specimen: Nasopharyngeal  Result Value Ref Range Status   MRSA by PCR NEGATIVE NEGATIVE Final    Comment:        The GeneXpert MRSA Assay (FDA approved for NASAL specimens only), is one component of a comprehensive MRSA colonization surveillance program. It is not intended to diagnose MRSA infection nor to guide or monitor treatment for MRSA infections. Performed at [facility], [address]  [place], [place] [address]   CULTURE, BLOOD (ROUTINE X 2) w Reflex to ID Panel     Status: None   Collection Time: 10/25/19  4:29 PM   Specimen: BLOOD  Result Value Ref Range Status   Specimen Description BLOOD CENTRAL LINE  Final   Special Requests   Final    BOTTLES DRAWN AEROBIC AND ANAEROBIC Blood Culture adequate volume   Culture   Final    NO GROWTH 5 DAYS Performed at [facility], [address], [place], [place] [address]    Report Status 10/30/2019 FINAL  Final  CULTURE, BLOOD (ROUTINE X 2)  w Reflex to ID Panel     Status: None   Collection Time: 10/26/19  6:45 AM   Specimen: BLOOD  Result Value Ref Range Status   Specimen Description BLOOD BLOOD LEFT HAND  Final   Special Requests   Final    BOTTLES DRAWN AEROBIC AND ANAEROBIC Blood Culture adequate volume   Culture   Final    NO GROWTH 5 DAYS Performed at [facility], [address], [place], [place] [address]    Report Status 10/31/2019 FINAL  Final  Body fluid culture     Status: None   Collection Time: 10/26/19 11:30 AM   Specimen: Fluid  Result Value Ref Range Status   Specimen Description   Final    FLUID Performed at [facility], [address] [place], [place] [address]    Special Requests   Final    PERITONEAL Performed at [facility], [address] [place], [place] [address]    Gram Stain   Final    ABUNDANT WBC PRESENT, PREDOMINANTLY PMN NO ORGANISMS SEEN    Culture   Final    NO GROWTH Performed at [facility], [place] [address] [place], [place] [address]    Report Status 10/30/2019 FINAL  Final  Body fluid culture     Status: None   Collection Time: 10/29/19  1:39 PM   Specimen: PATH Cytology Peritoneal fluid  Result Value Ref Range Status   Specimen Description   Final    PERITONEAL Performed at [facility], [address], [place], [place] [address]    Special Requests   Final    NONE Performed at [facility], [address] [place], [place] [address]    Gram Stain   Final    MODERATE WBC PRESENT, PREDOMINANTLY PMN NO ORGANISMS SEEN    Culture   Final    NO GROWTH 3 DAYS Performed at [facility], [place] [address] [place], [place] [address]    Report Status 11/02/2019 FINAL  Final  Gram stain     Status: None   Collection Time: 11/02/19  3:30 PM   Specimen: PATH Cytology Peritoneal fluid  Result Value Ref Range Status   Specimen Description PERITONEAL  Final   Special Requests NONE  Final   Gram Stain   Final    WBC SEEN RED BLOOD CELLS NO ORGANISMS SEEN Performed at [facility], [address] [place],  [address]    Report Status 11/02/2019 FINAL  Final    Coagulation Studies: No results for input(s): LABPROT, INR in the last 72 hours.  Urinalysis: No results for input(s): COLORURINE, LABSPEC, PHURINE, GLUCOSEU, HGBUR, BILIRUBINUR, KETONESUR, PROTEINUR, UROBILINOGEN, NITRITE, LEUKOCYTESUR in the last 72 hours.  Invalid input(s): APPERANCEUR    Imaging: No results found.   Medications:   . sodium chloride Stopped (11/01/19 1131)  . albumin human 25 g (11/06/19 1138)   . apixaban  5 mg Oral BID  . Chlorhexidine Gluconate Cloth  6 each Topical Daily  . feeding supplement (NEPRO CARB STEADY)  237 mL Oral Q24H  . furosemide  60 mg Intravenous BID  . iron polysaccharides  150 mg Oral Daily  . mouth rinse  15 mL Mouth Rinse BID  . melatonin  2.5 mg Oral QHS  . midodrine  10 mg Oral TID WC  . multivitamin  1 tablet Oral QHS  . pantoprazole  40 mg Oral BID  . sodium chloride flush  3 mL Intravenous Q12H   sodium chloride, acetaminophen, albumin human, albuterol, alum & mag hydroxide-simeth **AND** lidocaine, bisacodyl, calcium carbonate, Glycerin (Adult), loperamide, ondansetron (ZOFRAN) IV, ondansetron (ZOFRAN) IV, oxyCODONE-acetaminophen, sodium chloride flush  Assessment/ Plan:  [name] is a 64 y.o. black female with congestive heart  failure, hypertension, hyperlipidemia, gout, COPD, anemia, cocaine abuse who is admitted to [facility] on [facility]/19/2019 for Peripheral edema [R60.9] Acute CHF (congestive heart failure) ([place]) [I50.9] AKI (acute kidney injury) ([place]) [N17.9] Congestive heart failure, unspecified HF chronicity, unspecified heart failure type (HCC) [S28.7] Acute diastolic (congestive) heart failure ([place]) [I50.31]  1. Acute kidney injury on chronic kidney disease stage IIIB. Baseline creatinine of 1.66, GFR of 32. With history of proteinuria.  Chronic kidney disease secondary to hypertension  Acute renal failure secondary to acute cardiorenal syndrome   Lab Results  Component Value Date   CREATININE 3.09 (H) 11/10/2019   CREATININE 2.90 (H) 11/09/2019   CREATININE 2.70 (H) 11/08/2019   Creatinine level worsening slowly and gradually BUN 39 GFR 16 Worsening bilateral lower extremity edema Nursing staff reports low BP, had to hold her Furosemide dose We are not planning for dialysis today, will assess her daily She may require dialysis tomorrow  if the renal function and volume status getting worse  2. Hypotension with acute exacerbation of systolic and diastolic congestive heart failure. Echocardiogram 06/22/19 EF of 25-30%.  BP 85/61 this morning Patient didn't receive IV Furosemide Will increase Midodrine to 10 mg TID  4. Anemia of Chronic Disease Hemoglobin low today, 7.8  Will continue monitoring CBCs closely    LOS: 22 Derika Eckles 11/2/202112:35 PM

## 2019-11-10 NOTE — Progress Notes (Signed)
Pts BP 85/61. MD notified. Per MD hold morning dose of lasix.

## 2019-11-10 NOTE — Progress Notes (Addendum)
PROGRESS NOTE  Katelyn Lane NFA:[id number] DOB: 05/18/1955 DOA: 10/19/2019 PCP: [name], MD  Brief History   Katelyn Lane is a 64 y.o. 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 Found to have BNP greater than 4500, elevated troponin and AKI.   Multiple prior admissions with similar symptoms and complaints.  Prolonged hospitalization in August 2021 when she was discharged to SNF.  Not clear when she get out of SNF.  Continues to complain that she is homeless but per social worker she do have a house.  Not take her medication despite being provided with medical management.   After admission to the hospital, she was diagnosed with acute on chronic combined systolicanddiastoliccongestiveheartfailure with ejection fraction 25 to 30%.  She was not responding to IV Lasix.  She was seen by nephrology, temporary dialysis catheter was performed and she was dialyzed on 10/17 and 10/18. Patient also complaining of abdominal pain, she had a paracentesis, study showed possible spontaneous peritonitis, she was treated with Rocephin and Flagyl.   Patient had a worsening respiratory status on 10/17, was diagnosed with aspiration pneumonia.  She was treated with Rocephin, Zithromax and Flagyl. Patient also had abdominal pain and a CT scan suspect ischemia, but seen by general surgery on 10/19, no need for surgery.   10/21.  Repeat paracentesis performed, antibiotic changed to Rocephin only.  Permacath placed.   10/22.  Repeated paracentesis showed white cell count of 3048, 69% neutrophils.  However, patient clinically improving.  Continue Rocephin   10/26.  Repeated paracentesis showed white cell count of 1636.   10/27: The patient was unable to  tolerate ultrafiltration due to hypotension.   11/09/2019: no acute need for dialysis now. Palliative care has been consulted and is talking with the patient.   11/10/2019: Pt with hypotension and Na of 128 this am. Morning dose of Lasix 60 mg IV was held. After discussion with nephrology, the lasix dose was reduced to 20 mg to limit need to hold doses of diuretic in the future. Consultants  . Gastroenterology . Psychiatry . General Surgery . Vascular surgery . Cardiology . Nephrology  Procedures  . Paracentesis x 3 . Placement of temporary dialysis catheter. . Dialysis  Antibiotics   . Anti-infectives (From admission, onward)   .  Marland Kitchen Start .   .   Zada Girt . Route . Frequency . Ordered . Stop  .  Marland Kitchen 11/01/19 1000 .  Marland Kitchen cefTRIAXone (ROCEPHIN) 2 g in sodium chloride 0.9 % 100 mL IVPB  Status:  Discontinued      .   . 2 g . 200 mL/hr over 30 Minutes . Intravenous . Daily . 10/31/19 1521 . 11/09/19 1425  .  Marland Kitchen 10/27/19 1000 .  Marland Kitchen cefTRIAXone (ROCEPHIN) 2 g in sodium chloride 0.9 % 100 mL IVPB  Status:  Discontinued      .   . 2 g . 200 mL/hr over 30 Minutes . Intravenous . Daily . 10/26/19 1559 . 10/31/19 1521  .  . 10/26/19 2000 .  Marland Kitchen cefTRIAXone (ROCEPHIN) 1 g in sodium chloride 0.9 % 100 mL IVPB      .   . 1 g . 200 mL/hr over 30 Minutes . Intravenous .  Once . 10/26/19 1559 . 10/26/19 2024  .  Marland Kitchen 10/26/19  1600 .  . cefTRIAXone (ROCEPHIN) 2 g in sodium chloride 0.9 % 100 mL IVPB  Status:  Discontinued      .   . 2 g . 200 mL/hr over 30 Minutes . Intravenous . Every 24 hours . 10/26/19 1351 . 10/26/19 1600  .  Marland Kitchen 10/26/19 1000 .  Marland Kitchen metroNIDAZOLE (FLAGYL) IVPB 500 mg  Status:  Discontinued      .   Marland Kitchen 500 mg . 100 mL/hr over 60 Minutes . Intravenous . Every 8 hours . 10/26/19 3976 . 10/29/19 1138  .  Marland Kitchen 10/25/19 1445 .  Marland Kitchen azithromycin (ZITHROMAX) 500 mg in sodium chloride 0.9 % 250 mL IVPB  Status:  Discontinued      .   Marland Kitchen 500 mg . 250 mL/hr over 60 Minutes . Intravenous . Every 24 hours  . 10/25/19 1353 . 10/29/19 1138  .  Marland Kitchen 10/25/19 1230 .  Marland Kitchen azithromycin (ZITHROMAX) tablet 500 mg  Status:  Discontinued      .   Marland Kitchen 500 mg . Oral . Daily . 10/25/19 1133 . 10/25/19 1134  .  Marland Kitchen 10/25/19 1030 .  Marland Kitchen cefTRIAXone (ROCEPHIN) 1 g in sodium chloride 0.9 % 100 mL IVPB  Status:  Discontinued      .   . 1 g . 200 mL/hr over 30 Minutes . Intravenous . Every 12 hours . 10/25/19 0931 . 10/26/19 1351   .   Marland Kitchen  Subjective  The patient is resting quietly. No new complaints.  Objective   Vitals:  Vitals:   11/10/19 1612 11/10/19 1615  BP: 91/79   Pulse: 81   Resp: 16   Temp: 98.2 F (36.8 C)   SpO2:  100%   Exam:  Constitutional:  . The patient is awake, alert, and oriented x 3. No acute distress. Respiratory:  . No increased work of breathing. . No wheezes, rales, or rhonchi . No tactile fremitus Cardiovascular:  . Regular rate and rhythm . No murmurs, ectopy, or gallups. . No lateral PMI. No thrills. Abdomen:  . Abdomen is soft, non-tender, non-distended . No hernias, masses, or organomegaly . Normoactive bowel sounds.  Musculoskeletal:  . No cyanosis, clubbing, or edema Skin:  . No rashes, lesions, ulcers . palpation of skin: no induration or nodules Neurologic:  . CN 2-12 intact . Sensation all 4 extremities intact Psychiatric:  . Mental status o Mood, affect appropriate o Orientation to person, place, time  . judgment and insight appear intact I have personally reviewed the following:   Today's Data  . Vitals, BMP, CBC  Lab Data  . Pathology review of peritoneal fluid with cytospin. No malignant cells. Abundant inflammation. 1342 neutrophils.  Micro Data  . Peritoneal fluid: No growth.  Scheduled Meds: . apixaban  5 mg Oral BID  . Chlorhexidine Gluconate Cloth  6 each Topical Daily  . feeding supplement (NEPRO CARB STEADY)  237 mL Oral Q24H  . furosemide  20 mg Intravenous BID  . iron polysaccharides  150 mg Oral Daily  . mouth rinse  15 mL Mouth  Rinse BID  . melatonin  2.5 mg Oral QHS  . midodrine  10 mg Oral TID WC  . multivitamin  1 tablet Oral QHS  . pantoprazole  40 mg Oral BID  . sodium chloride flush  3 mL Intravenous Q12H   Continuous Infusions: . sodium chloride Stopped (11/01/19 1131)  . albumin human 25 g (11/06/19 1138)    Principal Problem:  Spontaneous bacterial peritonitis ([place]) Active Problems:   AKI (acute kidney injury) ([place])   Crack cocaine use   Acute respiratory failure with hypoxia (HCC)   LV (left ventricular) mural thrombus   Nonsustained ventricular tachycardia (HCC)   Acute CHF (congestive heart failure) (HCC)   Cocaine abuse ([place])   Peripheral edema   Cirrhosis of liver (HCC)   Dementia (HCC)   Aspiration pneumonia of both lower lobes due to gastric secretions (HCC)   Acute diastolic (congestive) heart failure (HCC)   Cardiorenal disease   Goals of care, counseling/discussion   Palliative care by specialist   LOS: 22 days   A & P  Spontaneous bacterial peritonitis: Repeat paracentesis showed significant decrease of white cell in ascites, antibiotics effective. Hoever she still has a lot of neutrophils in 11/03/2019 fluid.  Culture has been negative. Rocephin has been discontinued. Monitor.   Acute on chronic combined systolic and diastolic congestive heart failure. Acute hypoxemic respiratory failure. Positive for continued rales at bases bilaterally.  Volume management with ultrafiltration. She has been receiving Lasix 60 mg IV bid. However, Palliative care has been consulted. Pt with hypotension and Na of 128 this am. Morning dose of Lasix 60 mg IV was held. After discussion with nephrology, the lasix dose was reduced to 20 mg to limit need to hold doses of diuretic in the future.  Liver cirrhosis with ascites: Noted. Continue to follow.   Acute renal failure on chronic kidney disease stage IIIb: Due to cardiorenal syndrome.  Renal function appears to be improving, although creatinine up  slightly for the last 2 days. Patient still has permacath placed. Due to patient poor response to diuretics, she will scheduled ultrafiltration as outpatient, although she does not meet criteria for the diagnosis of End Stage renal disease.Marland Kitchen  However, due to patient polysubstance abuse, the presence of a permacath presents a danger to her well being. On the other hand the patient cannot survive without dialysis. Today in the presence of Dr. Holley Raring, the patient agreed that she would avoid illicit drug use if we left the permacath in place. I explained to her that the use of illicit substances would put her life in danger, and require the removal of the dialysis catheter. She has voiced understanding. Pt with hypotension and Na of 128 this am as well as increased creatinine. Morning dose of Lasix 60 mg IV was held. After discussion with nephrology, the lasix dose was reduced to 20 mg to limit need to hold doses of diuretic in the future.  Hyponatremia: Mild. Lasix dosage reduced.   Left ventricular thrombus with history of CVA. Patient was initially concerned for possible GI bleed, anticoagulation was on hold.  However, there is no GI bleed observed while in the hospital.  GI was not concerned about it.  It is not safe to continue hold anticoagulation.  Anticoagulation has now been restarted.  Monitor hemoglobin.  Overgrown toenails: The patient is complaining of badly over grown toenails. I have explained to her that podiatrists are unable to clut toenails as inpatient. Nurses aren't able to cut them. The patient states that every time she has an appointment to see podiatry for them as outpatient, she is inpatient.    Anemia of chronic disease: Hemoglobin still stable.  Patient had a normal B12 level recently, but she also has mild iron deficiency.  Oral supplement.   Hypokalemia: Resolved. Monitor.   I have seen and examined this patient myself. I have spent 32 minutes in her  evaluation and care.  DVT  prophylaxis: Eliquis Code Status: Full Family Communication: None available Disposition Plan:    Status is: Inpatient   Remains inpatient appropriate because:Inpatient level of care appropriate due to severity of illness   Dispo: The patient is from: Home              Anticipated d/c is to: SNF. Patient has been approved for nursing home placement.  Has a bed available, but is still requiring dialysis.              Anticipated d/c date is: 2 days              Patient currently is not medically stable to d/c.    Katelyn Dearman, DO [facility] Direct contact: see [email or web address]  7PM-7AM contact night coverage as above 11/10/2019, 5:13 PM  LOS: 16 days

## 2019-11-11 DIAGNOSIS — Z7189 Other specified counseling: Secondary | ICD-10-CM | POA: Diagnosis not present

## 2019-11-11 DIAGNOSIS — Z515 Encounter for palliative care: Secondary | ICD-10-CM | POA: Diagnosis not present

## 2019-11-11 DIAGNOSIS — K652 Spontaneous bacterial peritonitis: Secondary | ICD-10-CM | POA: Diagnosis not present

## 2019-11-11 DIAGNOSIS — I1310 Hypertensive heart and chronic kidney disease without heart failure, with stage 1 through stage 4 chronic kidney disease, or unspecified chronic kidney disease: Secondary | ICD-10-CM | POA: Diagnosis not present

## 2019-11-11 LAB — CBC WITH DIFFERENTIAL/PLATELET
Abs Immature Granulocytes: 0.04 10*3/uL (ref 0.00–0.07)
Basophils Absolute: 0.1 10*3/uL (ref 0.0–0.1)
Basophils Relative: 1 %
Eosinophils Absolute: 0.4 10*3/uL (ref 0.0–0.5)
Eosinophils Relative: 6 %
HCT: 24.2 % — ABNORMAL LOW (ref 36.0–46.0)
Hemoglobin: 8 g/dL — ABNORMAL LOW (ref 12.0–15.0)
Immature Granulocytes: 1 %
Lymphocytes Relative: 15 %
Lymphs Abs: 1 10*3/uL (ref 0.7–4.0)
MCH: 23.5 pg — ABNORMAL LOW (ref 26.0–34.0)
MCHC: 33.1 g/dL (ref 30.0–36.0)
MCV: 71.2 fL — ABNORMAL LOW (ref 80.0–100.0)
Monocytes Absolute: 0.8 10*3/uL (ref 0.1–1.0)
Monocytes Relative: 13 %
Neutro Abs: 4.2 10*3/uL (ref 1.7–7.7)
Neutrophils Relative %: 64 %
Platelets: 491 10*3/uL — ABNORMAL HIGH (ref 150–400)
RBC: 3.4 MIL/uL — ABNORMAL LOW (ref 3.87–5.11)
RDW: 19.4 % — ABNORMAL HIGH (ref 11.5–15.5)
WBC: 6.5 10*3/uL (ref 4.0–10.5)
nRBC: 0 % (ref 0.0–0.2)

## 2019-11-11 LAB — HEPATIC FUNCTION PANEL
ALT: 10 U/L (ref 0–44)
AST: 24 U/L (ref 15–41)
Albumin: 2.1 g/dL — ABNORMAL LOW (ref 3.5–5.0)
Alkaline Phosphatase: 312 U/L — ABNORMAL HIGH (ref 38–126)
Bilirubin, Direct: 0.3 mg/dL — ABNORMAL HIGH (ref 0.0–0.2)
Indirect Bilirubin: 0.5 mg/dL (ref 0.3–0.9)
Total Bilirubin: 0.8 mg/dL (ref 0.3–1.2)
Total Protein: 5.8 g/dL — ABNORMAL LOW (ref 6.5–8.1)

## 2019-11-11 LAB — BASIC METABOLIC PANEL
Anion gap: 9 (ref 5–15)
BUN: 45 mg/dL — ABNORMAL HIGH (ref 8–23)
CO2: 27 mmol/L (ref 22–32)
Calcium: 8.6 mg/dL — ABNORMAL LOW (ref 8.9–10.3)
Chloride: 93 mmol/L — ABNORMAL LOW (ref 98–111)
Creatinine, Ser: 3.14 mg/dL — ABNORMAL HIGH (ref 0.44–1.00)
GFR, Estimated: 16 mL/min — ABNORMAL LOW (ref >60)
Glucose, Bld: 107 mg/dL — ABNORMAL HIGH (ref 70–99)
Potassium: 4.5 mmol/L (ref 3.5–5.1)
Sodium: 129 mmol/L — ABNORMAL LOW (ref 135–145)

## 2019-11-11 LAB — PROTIME-INR
INR: 1.8 — ABNORMAL HIGH (ref 0.8–1.2)
Prothrombin Time: 20.3 seconds — ABNORMAL HIGH (ref 11.4–15.2)

## 2019-11-11 LAB — GLUCOSE, CAPILLARY: Glucose-Capillary: 168 mg/dL — ABNORMAL HIGH (ref 70–99)

## 2019-11-11 LAB — AMMONIA: Ammonia: 16 umol/L (ref 9–35)

## 2019-11-11 NOTE — Progress Notes (Signed)
[facility]  ROUNDING NOTE   Subjective:   Patient resting in bed, pleasant and comfortable.Palliative care Nurse Practitioner and hospitalist joined the discussions at bedside while we were talking about her decisions about dialysis and overall goals of care. She says dialysis is not making her feel good and she verbalized her understanding about low blood pressure even with 'that medicine you gave me' mentioning Midodrine.She  also states  that her 'heart is not working well,only 25 to 30%' Patient states she wants to 'rest in peace' and 'leaving everything to God' when asked about further care.   Objective:  Vital signs in last 24 hours:  Temp:  [97.9 F (36.6 C)-100 F (37.8 C)] 98.2 F (36.8 C) (11/03 1132) Pulse Rate:  [63-92] 84 (11/03 1132) Resp:  [15-18] 18 (11/03 1132) BP: (91-101)/(65-85) 97/76 (11/03 1132) SpO2:  [93 %-100 %] 96 % (11/03 1132) Weight:  [95.1 kg] 95.1 kg (11/03 0245)  Weight change:  Filed Weights   11/08/19 0644 11/09/19 0219 11/11/19 0245  Weight: 95.7 kg 95 kg 95.1 kg    Intake/Output: I/O last 3 completed shifts: In: 340 [P.O.:240; IV Piggyback:100] Out: 200 [Urine:200]   Intake/Output this shift:  Total I/O In: -  Out: 200 [Urine:200]  Physical Exam: General:  Sitting up in bed, awake and alert  Head  Normocephalic,Atraumatic  Lungs:  Lungs diminished at the bases, Normal effort  Heart:  Regular rate and rhythm  Abdomen:   Non distended, nontender  Extremities:  1+ peripheral edema  Neurologic:  Oriented x 3  Skin: No acute lesions or rashes  Rt IJ catheter  Basic Metabolic Panel: Recent Labs  Lab 11/06/19 0453 11/06/19 0453 11/08/19 0316 11/08/19 0316 11/09/19 0418 11/10/19 0409 11/11/19 0433  NA 133*  --  132*  --  130* 128* 129*  K 3.8  --  4.1  --  3.8 3.8 4.5  CL 95*  --  94*  --  94* 92* 93*  CO2 28  --  26  --  28 27 27   GLUCOSE 100*  --  97  --  104* 104* 107*  BUN 38*  --  35*  --  36* 39* 45*   CREATININE 2.48*  --  2.70*  --  2.90* 3.09* 3.14*  CALCIUM 8.7*   < > 8.7*   < > 8.6* 8.5* 8.6*  PHOS 2.9  --   --   --   --   --   --    < > = values in this interval not displayed.    Liver Function Tests: Recent Labs  Lab 11/11/19 0433  AST 24  ALT 10  ALKPHOS 312*  BILITOT 0.8  PROT 5.8*  ALBUMIN 2.1*   No results for input(s): LIPASE, AMYLASE in the last 168 hours. Recent Labs  Lab 11/11/19 1030  AMMONIA 16    CBC: Recent Labs  Lab 11/07/19 0720 11/08/19 0316 11/09/19 0418 11/10/19 0409 11/11/19 0433  WBC 8.2 8.7 6.5 6.2 6.5  NEUTROABS 5.4 6.0 4.1 4.1 4.2  HGB 8.9* 8.1* 8.1* 7.8* 8.0*  HCT 27.6* 25.1* 25.1* 24.8* 24.2*  MCV 71.5* 72.5* 71.3* 72.9* 71.2*  PLT 448* 458* 491* 508* 491*    Cardiac Enzymes: No results for input(s): CKTOTAL, CKMB, CKMBINDEX, TROPONINI in the last 168 hours.  BNP: Invalid input(s): POCBNP  CBG: Recent Labs  Lab 11/05/19 0758 11/11/19 0024  GLUCAP 98 168*    Microbiology: Results for orders placed or performed during the  hospital encounter of 10/19/19  Respiratory Panel by RT PCR (Flu A&B, Covid) - Nasopharyngeal Swab     Status: None   Collection Time: 10/19/19 11:13 PM   Specimen: Nasopharyngeal Swab  Result Value Ref Range Status   SARS Coronavirus 2 by RT PCR NEGATIVE NEGATIVE Final    Comment: (NOTE) SARS-CoV-2 target nucleic acids are NOT DETECTED.  The SARS-CoV-2 RNA is generally detectable in upper respiratoy specimens during the acute phase of infection. The lowest concentration of SARS-CoV-2 viral copies this assay can detect is 131 copies/mL. A negative result does not preclude SARS-Cov-2 infection and should not be used as the sole basis for treatment or other patient management decisions. A negative result may occur with  improper specimen collection/handling, submission of specimen other than nasopharyngeal swab, presence of viral mutation(s) within the areas targeted by this assay, and inadequate  number of viral copies (<131 copies/mL). A negative result must be combined with clinical observations, patient history, and epidemiological information. The expected result is Negative.  Fact Sheet for Patients:  PinkCheek.be  Fact Sheet for Healthcare Providers:  GravelBags.it  This test is no t yet approved or cleared by the [place] FDA and  has been authorized for detection and/or diagnosis of SARS-CoV-2 by FDA under an Emergency Use Authorization (EUA). This EUA will remain  in effect (meaning this test can be used) for the duration of the COVID-19 declaration under Section 564(b)(1) of the Act, 21 U.S.C. section 360bbb-3(b)(1), unless the authorization is terminated or revoked sooner.     Influenza A by PCR NEGATIVE NEGATIVE Final   Influenza B by PCR NEGATIVE NEGATIVE Final    Comment: (NOTE) The Xpert Xpress SARS-CoV-2/FLU/RSV assay is intended as an aid in  the diagnosis of influenza from Nasopharyngeal swab specimens and  should not be used as a sole basis for treatment. Nasal washings and  aspirates are unacceptable for Xpert Xpress SARS-CoV-2/FLU/RSV  testing.  Fact Sheet for Patients: PinkCheek.be  Fact Sheet for Healthcare Providers: GravelBags.it  This test is not yet approved or cleared by the [place] FDA and  has been authorized for detection and/or diagnosis of SARS-CoV-2 by  FDA under an Emergency Use Authorization (EUA). This EUA will remain  in effect (meaning this test can be used) for the duration of the  Covid-19 declaration under Section 564(b)(1) of the Act, 21  U.S.C. section 360bbb-3(b)(1), unless the authorization is  terminated or revoked. Performed at [facility], [address] [place], [place] [address]   MRSA PCR Screening     Status: None   Collection Time: 10/25/19  2:50 PM   Specimen:  Nasopharyngeal  Result Value Ref Range Status   MRSA by PCR NEGATIVE NEGATIVE Final    Comment:        The GeneXpert MRSA Assay (FDA approved for NASAL specimens only), is one component of a comprehensive MRSA colonization surveillance program. It is not intended to diagnose MRSA infection nor to guide or monitor treatment for MRSA infections. Performed at [facility], [address] [place],  [address]   CULTURE, BLOOD (ROUTINE X 2) w Reflex to ID Panel     Status: None   Collection Time: 10/25/19  4:29 PM   Specimen: BLOOD  Result Value Ref Range Status   Specimen Description BLOOD CENTRAL LINE  Final   Special Requests   Final    BOTTLES DRAWN AEROBIC AND ANAEROBIC Blood Culture adequate volume   Culture   Final  NO GROWTH 5 DAYS Performed at [facility], [address] [place], [place] [address]    Report Status 10/30/2019 FINAL  Final  CULTURE, BLOOD (ROUTINE X 2) w Reflex to ID Panel     Status: None   Collection Time: 10/26/19  6:45 AM   Specimen: BLOOD  Result Value Ref Range Status   Specimen Description BLOOD BLOOD LEFT HAND  Final   Special Requests   Final    BOTTLES DRAWN AEROBIC AND ANAEROBIC Blood Culture adequate volume   Culture   Final    NO GROWTH 5 DAYS Performed at [facility], [address], [place], [place] [address]    Report Status 10/31/2019 FINAL  Final  Body fluid culture     Status: None   Collection Time: 10/26/19 11:30 AM   Specimen: Fluid  Result Value Ref Range Status   Specimen Description   Final    FLUID Performed at [facility], [address] [place], [place] [address]    Special Requests   Final    PERITONEAL Performed at [facility], [address] [place], [place] [address]    Gram Stain   Final    ABUNDANT WBC PRESENT, PREDOMINANTLY PMN NO ORGANISMS SEEN    Culture   Final    NO GROWTH Performed at [facility], [place] [address] [place],  [place] [address]    Report Status 10/30/2019 FINAL  Final  Body fluid culture     Status: None   Collection Time: 10/29/19  1:39 PM   Specimen: PATH Cytology Peritoneal fluid  Result Value Ref Range Status   Specimen Description   Final    PERITONEAL Performed at [facility], [address] [place], [place] [address]    Special Requests   Final    NONE Performed at [facility], [address] [place], [place] [address]    Gram Stain   Final    MODERATE WBC PRESENT, PREDOMINANTLY PMN NO ORGANISMS SEEN    Culture   Final    NO GROWTH 3 DAYS Performed at [facility], [place] [address] [place], [place] [address]    Report Status 11/02/2019 FINAL  Final  Gram stain     Status: None   Collection Time: 11/02/19  3:30 PM   Specimen: PATH Cytology Peritoneal fluid  Result Value Ref Range Status   Specimen Description PERITONEAL  Final   Special Requests NONE  Final   Gram Stain   Final    WBC SEEN RED BLOOD CELLS NO ORGANISMS SEEN Performed at [facility], [address] [place],  [address]    Report Status 11/02/2019 FINAL  Final    Coagulation Studies: Recent Labs    11/11/19 1034  LABPROT 20.3*  INR 1.8*    Urinalysis: No results for input(s): COLORURINE, LABSPEC, PHURINE, GLUCOSEU, HGBUR, BILIRUBINUR, KETONESUR, PROTEINUR, UROBILINOGEN, NITRITE, LEUKOCYTESUR in the last 72 hours.  Invalid input(s): APPERANCEUR    Imaging: No results found.   Medications:   . sodium chloride Stopped (11/01/19 1131)  . albumin human 25 g (11/06/19 1138)   . apixaban  5 mg Oral BID  . Chlorhexidine Gluconate Cloth  6 each Topical Daily  . feeding supplement (NEPRO CARB STEADY)  237 mL Oral Q24H  . furosemide  20 mg Intravenous BID  . iron polysaccharides  150 mg Oral Daily  . mouth rinse  15 mL Mouth Rinse BID  . melatonin  2.5 mg Oral QHS  . midodrine  10 mg Oral TID WC  . multivitamin  1 tablet Oral QHS  . pantoprazole  40 mg  Oral BID  . sodium chloride flush  3 mL Intravenous Q12H   sodium chloride, acetaminophen, albumin human, albuterol, alum & mag hydroxide-simeth **AND** lidocaine, bisacodyl, calcium carbonate, Glycerin (Adult), loperamide, ondansetron (ZOFRAN) IV, ondansetron (ZOFRAN) IV, oxyCODONE-acetaminophen, sodium chloride flush  Assessment/ Plan:  [name] is a 64 y.o. black female with congestive heart failure, hypertension, hyperlipidemia, gout, COPD, anemia, cocaine abuse who is admitted to [facility] on 10/19/2019 for Peripheral edema [R60.9] Acute CHF (congestive heart failure) ([place]) [I50.9] AKI (acute kidney injury) ([place]) [N17.9] Congestive heart failure, unspecified HF chronicity, unspecified heart failure type (HCC) [M38.4] Acute diastolic (congestive) heart failure ([place]) [I50.31]  1. Acute kidney injury on chronic kidney disease stage IIIB. Baseline creatinine of 1.66, GFR of 32. With history of proteinuria.  Chronic kidney disease secondary to hypertension  Acute renal failure secondary to acute cardiorenal syndrome   Lab Results  Component Value Date   CREATININE 3.14 (H) 11/11/2019   CREATININE 3.09 (H) 11/10/2019   CREATININE 2.90 (H) 11/09/2019   Renal function declining gradually Patient opted for comfort measures No plan for further dialysis as patient feels its not making her feel good  2. Hypotension with acute exacerbation of systolic and diastolic congestive heart failure. Echocardiogram 06/22/19 EF of 25-30%.  Patient's blood pressure readings stays soft despite Midodrine 10 mg TID. Furosemide IV dose reduced to 20 mg BID yesterday  4. Anemia of Chronic Disease Hemoglobin stays low but stable  Nephrology signing off at this time.Please call us with any further questions   LOS: 23 Vennesa Bastedo 11/3/202112:40 PM

## 2019-11-11 NOTE — Progress Notes (Signed)
PROGRESS NOTE    Katelyn Lane  BDZ:[id number] DOB: [birth year] DOA: 10/19/2019 PCP: [name], MD    Chief Complaint  Patient presents with  . 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 Found to have BNP greater than 4500, elevated troponin and AKI.  Multiple prior admissions with similar symptoms and complaints. Prolonged hospitalization in August 2021 when she was discharged to SNF. Not clear when she get out of SNF. Continues to complain that she is homeless but per social worker she do have Katelyn Lane house. Not take her medication despite being provided with medical management.  After admission to the hospital, she was diagnosed with acute on chronic combinedsystolicanddiastoliccongestiveheartfailurewith ejection fraction 25 to 30%. She was not responding to IV Lasix. She was seen by nephrology, temporary dialysis catheter was performed and she was dialyzed on 10/17 and 10/18. Patient also complaining of abdominal pain, she had Katelyn Lane paracentesis, study showed possible spontaneous peritonitis, she was treated with Rocephin and Flagyl.  Patient had Katelyn Lane worsening respiratory status on 10/17, was diagnosed with aspiration pneumonia. She was treated with Rocephin, Zithromax and Flagyl. Patient also had abdominal pain and Benecio Kluger CT scan suspect ischemia, but seen by general surgery on 10/19, no need for surgery.  10/21.Repeat paracentesis performed, antibiotic changed to Rocephin only. Permacath placed.  10/22.Repeated paracentesis showed white cell count of 3048, 69% neutrophils. However, patient clinically improving. Continue Rocephin  10/26.Repeated paracentesis  showed white cell count of 1636.  10/27: The patient was unable to tolerate ultrafiltration due to hypotension.   11/09/2019: no acute need for dialysis now. Palliative care has been consulted and is talking with the patient.  11/10/2019: Pt with hypotension and Na of 128 this am. Morning dose of Lasix 60 mg IV was held. After discussion with nephrology, the lasix dose was reduced to 20 mg to limit need to hold doses of diuretic in the future.  Assessment & Plan:   Principal Problem:   Spontaneous bacterial peritonitis ([place]) Active Problems:   AKI (acute kidney injury) ([place])   Crack cocaine use   Acute respiratory failure with hypoxia (H[facility])   LV (left ventricular) mural thrombus   Nonsustained ventricular tachycardia (HCC)   Acute CHF (congestive heart failure) (HCC)   Cocaine abuse ([place])   Peripheral edema   Cirrhosis of liver (HCC)   Dementia (HCC)   Aspiration pneumonia of both lower lobes due to gastric secretions (HCC)   Acute diastolic (congestive) heart failure (HCC)   Cardiorenal disease   Goals of care, counseling/discussion   Palliative care by specialist  Spontaneous bacterial peritonitis: Repeat paracentesis showed significant decrease of white cell in ascites, antibiotics effective. Hoever she still has Katelyn Lane lot of neutrophils in 11/03/2019 fluid. Culture has been negative. Rocephin has been discontinued. Monitor.  Will start cipro for ppx (follow EKG for QT eval first)  Acute on chronic combined systolic and diastolic congestive heart failure. Acute hypoxemic respiratory failure.   Currently on room air, improved  She was dialyzed x2, but now getting diuresed -> using reduced dose of lasix with 20 mg BID  Liver cirrhosis with ascites: Noted. Continue to follow.  MELD 28.  As above needs SBP ppx.  Acute renal failure on chronic kidney disease stage IIIb: baseline creatinine appears to  be 1.66.  Due to cardiorenal syndrome.  Creatinine peaked at 4.16, improved  recently, though worsening to relatively stable in past few days.Renal function appears to be improving, although creatinine up slightly for the last 2 days.  Patient still has permacath placed.   No plan for further dialysis per renal  Continue diuresis as tolerated  Hyponatremia: Mild. Lasix dosage reduced. Continue to monitor.  Left ventricular thrombus with history of CVA. Patient was initially concerned for possible GI bleed, anticoagulation was on hold. However, there is no GI bleed observed while in the hospital. GI was not concerned about it. It is not safe to continue hold anticoagulation. Anticoagulation has now been restarted with eliquis. Monitor hemoglobin.  Overgrown toenails: will need outpatient podiatry follow up  Anemia of chronic disease: Hemoglobin still stable. Patient had Katelyn Lane normal B12 level in July as well as folate, but she also has mild iron deficiency. Oral supplement.  Hypokalemia: Resolved. Monitor.  DVT prophylaxis: eliquis Code Status: dnr Family Communication: none at bedside, declines me calling Disposition:   Status is: Inpatient  Remains inpatient appropriate because:Inpatient level of care appropriate due to severity of illness   Dispo: The patient is from: Home              Anticipated d/c is to: pending              Anticipated d/c date is: > 3 days              Patient currently is not medically stable to d/c.       Consultants:   Palliative  nephrology  Procedures: 10/20 perma cath insertion  Antimicrobials: Anti-infectives (From admission, onward)   Start     Dose/Rate Route Frequency Ordered Stop   11/01/19 1000  cefTRIAXone (ROCEPHIN) 2 g in sodium chloride 0.9 % 100 mL IVPB  Status:  Discontinued        2 g 200 mL/hr over 30 Minutes Intravenous Daily 10/31/19 1521 11/09/19 1425   10/27/19 1000  cefTRIAXone (ROCEPHIN) 2 g in sodium chloride 0.9 % 100 mL IVPB  Status:  Discontinued        2 g 200 mL/hr over 30  Minutes Intravenous Daily 10/26/19 1559 10/31/19 1521   10/26/19 2000  cefTRIAXone (ROCEPHIN) 1 g in sodium chloride 0.9 % 100 mL IVPB        1 g 200 mL/hr over 30 Minutes Intravenous  Once 10/26/19 1559 10/26/19 2024   10/26/19 1600  cefTRIAXone (ROCEPHIN) 2 g in sodium chloride 0.9 % 100 mL IVPB  Status:  Discontinued        2 g 200 mL/hr over 30 Minutes Intravenous Every 24 hours 10/26/19 1351 10/26/19 1600   10/26/19 1000  metroNIDAZOLE (FLAGYL) IVPB 500 mg  Status:  Discontinued        500 mg 100 mL/hr over 60 Minutes Intravenous Every 8 hours 10/26/19 0909 10/29/19 1138   10/25/19 1445  azithromycin (ZITHROMAX) 500 mg in sodium chloride 0.9 % 250 mL IVPB  Status:  Discontinued        500 mg 250 mL/hr over 60 Minutes Intravenous Every 24 hours 10/25/19 1353 10/29/19 1138   10/25/19 1230  azithromycin (ZITHROMAX) tablet 500 mg  Status:  Discontinued        500 mg Oral Daily 10/25/19 1133 10/25/19 1134   10/25/19 1030  cefTRIAXone (ROCEPHIN) 1 g in sodium chloride 0.9 % 100 mL IVPB  Status:  Discontinued  1 g 200 mL/hr over 30 Minutes Intravenous Every 12 hours 10/25/19 0931 10/26/19 1351     Subjective: No new complaints Feels at peace  Objective: Vitals:   11/11/19 0439 11/11/19 0753 11/11/19 1132 11/11/19 1534  BP: 98/85 101/65 97/76 96/82   Pulse: 88 63 84 94  Resp: 16 16 18 16   Temp: 97.9 F (36.6 C) 99.1 F (37.3 C) 98.2 F (36.8 C) (!) 97.5 F (36.4 C)  TempSrc:  Oral Oral Oral  SpO2: 100% 100% 96% 100%  Weight:      Height:        Intake/Output Summary (Last 24 hours) at 11/11/2019 1844 Last data filed at 11/11/2019 1302 Gross per 24 hour  Intake --  Output 400 ml  Net -400 ml   Filed Weights   11/08/19 0644 11/09/19 0219 11/11/19 0245  Weight: 95.7 kg 95 kg 95.1 kg    Examination:  General exam: Appears calm and comfortable  Respiratory system: Clear to auscultation. Respiratory effort normal. Cardiovascular system: S1 & S2 heard, RRR.   Gastrointestinal system: Abdomen is nondistended, soft and nontender Central nervous system: Alert and oriented. No focal neurological deficits. Extremities: no LEE Skin: No rashes, lesions or ulcers Psychiatry: Judgement and insight appear normal. Mood & affect appropriate.     Data Reviewed: I have personally reviewed following labs and imaging studies  CBC: Recent Labs  Lab 11/07/19 0720 11/08/19 0316 11/09/19 0418 11/10/19 0409 11/11/19 0433  WBC 8.2 8.7 6.5 6.2 6.5  NEUTROABS 5.4 6.0 4.1 4.1 4.2  HGB 8.9* 8.1* 8.1* 7.8* 8.0*  HCT 27.6* 25.1* 25.1* 24.8* 24.2*  MCV 71.5* 72.5* 71.3* 72.9* 71.2*  PLT 448* 458* 491* 508* 491*    Basic Metabolic Panel: Recent Labs  Lab 11/06/19 0453 11/08/19 0316 11/09/19 0418 11/10/19 0409 11/11/19 0433  NA 133* 132* 130* 128* 129*  K 3.8 4.1 3.8 3.8 4.5  CL 95* 94* 94* 92* 93*  CO2 28 26 28 27 27   GLUCOSE 100* 97 104* 104* 107*  BUN 38* 35* 36* 39* 45*  CREATININE 2.48* 2.70* 2.90* 3.09* 3.14*  CALCIUM 8.7* 8.7* 8.6* 8.5* 8.6*  PHOS 2.9  --   --   --   --     GFR: Estimated Creatinine Clearance: 21.3 mL/min ([name]) (by C-G formula based on SCr of 3.14 mg/dL (H)).  Liver Function Tests: Recent Labs  Lab 11/11/19 0433  AST 24  ALT 10  ALKPHOS 312*  BILITOT 0.8  PROT 5.8*  ALBUMIN 2.1*    CBG: Recent Labs  Lab 11/05/19 0758 11/11/19 0024  GLUCAP 98 168*     Recent Results (from the past 240 hour(s))  Gram stain     Status: None   Collection Time: 11/02/19  3:30 PM   Specimen: PATH Cytology Peritoneal fluid  Result Value Ref Range Status   Specimen Description PERITONEAL  Final   Special Requests NONE  Final   Gram Stain   Final    WBC SEEN RED BLOOD CELLS NO ORGANISMS SEEN Performed at [facility], [address], [place], [place] [address]    Report Status 11/02/2019 FINAL  Final         Radiology Studies: No results found.      Scheduled Meds: . apixaban  5 mg Oral BID  .  Chlorhexidine Gluconate Cloth  6 each Topical Daily  . feeding supplement (NEPRO CARB STEADY)  237 mL Oral Q24H  . furosemide  20 mg Intravenous BID  .  iron polysaccharides  150 mg Oral Daily  . mouth rinse  15 mL Mouth Rinse BID  . melatonin  2.5 mg Oral QHS  . midodrine  10 mg Oral TID WC  . multivitamin  1 tablet Oral QHS  . pantoprazole  40 mg Oral BID  . sodium chloride flush  3 mL Intravenous Q12H   Continuous Infusions: . sodium chloride Stopped (11/01/19 1131)  . albumin human 25 g (11/06/19 1138)     LOS: 23 days    Time spent: over 30 min    Fayrene Helper, MD [facility]   To contact the attending provider between 7A-7P or the covering provider during after hours 7P-7A, please log into the web site [email or web address] and access using universal [place] password for that web site. If you do not have the password, please call the hospital operator.  11/11/2019, 6:44 PM

## 2019-11-11 NOTE — Progress Notes (Signed)
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 Hospitalization complicated by need for dialysis and aspiration pneumonia. She did require dialysis during hospitalization but tolerated poorly with hypotension.  I met today at Katelyn Lane's bedside along with Dr. [name] and Roderick Pee, NP and also joined by Dr. Florene Glen. They were discussing her overall condition with cardiac and renal failure and during this discussion she is able to express understanding and acceptance that her life will be limited. She focuses on her trust in God to take care of her and expresses that she is not interested in further dialysis. She talks about trusting God and readiness when she reaches the end of her life. I encouraged her to speak with her sister, Katelyn Lane, as she seemed worried about her. Katelyn Lane responds by saying that her family should not worry because she is not worried.   Her wishes seems consistent with goals discussed with sister, Katelyn Lane. No plans for further dialysis. Optimize and plans for SNF with palliative to follow. Although she could decline and be hospice eligible in the near future. She seems to be compensating well for now.   Exam: Alert. More oriented this morning (seems to be improved in morning times) with good recall of past days events. No distress. Breathing regular, unlabored. Abd soft, flat. Moves all extremities.   Plan: - Her stated goals of care are consistent with discussion with sister, Katelyn Lane.  - Continue DNR. Continue to optimize with medication. No plans for further dialysis.   25 min   Katelyn Sill, NP Palliative  Medicine Team Pager [phone] (Please see [email or web address] for schedule) Team Phone [phone]    Greater than 50%  of this time was spent counseling and coordinating care related to the above assessment and plan

## 2019-11-11 NOTE — Progress Notes (Signed)
End of Shift: Patient sleeping in bed at shift change. Bed low and locked. Call bell in reach. Report given to Bet, RN. No acute changes over night.

## 2019-11-11 NOTE — Progress Notes (Addendum)
   11/11/19 1415  Clinical Encounter Type  Visited With Patient  Visit Type Follow-up  Referral From Chaplain  Consult/Referral To Chaplain  When chaplain arrived Pt was sitting on the commode. Chaplain told Pt she would come back. Chaplain stood outside Pt's room until nurse assisted her with getting in bed. Chaplain visited with Pt for over thirty minutes trying to find out how she was feeling. Pt told chaplain she was able to bend over. Pt said "I know God is good." Pt told chaplain about going to dialysis. She told me doctor said she wouldn't be going anymore and then doctor asked her what she through about taking dialysis again. There were times during the conversation, when Pt appeared to be confused. Pt mentioned trying to reach her brother, Ramie. She said she had not been able to reach him. Chaplain suggested having her sister reach out to him. Pt does not want to talk to [name]. Chaplain suggested having Regina contact Ramie for her. While talking Pt's breathing was labored and chaplain asked about her oxygen and she put it on . Pt asked chaplain to pray for her friend, Karin Lieu. Regina called while chaplain visited with Pt and chaplain told her she would check on her later and chaplain left, getting Pt time to talk with her sister.

## 2019-11-11 NOTE — Progress Notes (Signed)
Physical Therapy Treatment Patient Details Name: Katelyn Lane MRN: [id number] DOB: [birth year] Today's Date: 11/11/2019    History of Present Illness presented to ER secondary to LE edema, progressive SOB; admitted for management of acute/chronic CHF, spontaneous bacterial peritonitis (s/p paracentesis 10/25).  Hospital course also significant for initiation of dialysis (initially, R temp fem cath; converted to R permcath)    PT Comments    Pt agrees to session today.  She is able to exit bed L with min a x 1 per her demand but does not seem to need actual physical help.  Stands with min guard/supervision and despite cues to sit back down so I could obtain walker for her she pushes me aside and walks across too and gets it herself.  She then walks a complete lap but does sit in wheelchair x 1 per her initiation to rest.  Returns to bed with min a x 1.  Pt very demanding and aggressive at times during session.  She self directs and dictates session and is not open to any cues or education to improve overall safety.  Recommend +2 assist for staff safety and wheelchair with O2.  Despite attempts to please pt and to accommodate her needs she remained aggressive and generally unsatisfied with session and how I met her needs.  SNF is recommended but with proper support at home, she may do better in her own environment with family if they are able to provide support.     Follow Up Recommendations  SNF;Other (comment)     Equipment Recommendations  Rolling walker with 5" wheels;3in1 (PT)    Recommendations for Other Services       Precautions / Restrictions Precautions Precautions: Fall Restrictions Weight Bearing Restrictions: No Other Position/Activity Restrictions: spontanwous, self dircts care, demandig    Mobility  Bed Mobility Overal bed mobility: Needs Assistance Bed Mobility: Supine to Sit;Sit to Supine     Supine to sit: Min assist Sit to supine: Min assist   General bed  mobility comments: can do on her own but demands assist.  Transfers Overall transfer level: Needs assistance Equipment used: Rolling walker (2 wheeled) Transfers: Sit to/from Stand Sit to Stand: Supervision         General transfer comment: impulsive nto waiting for walker to be brought to her then walks quickly to door where walker is folded up and grabs it despite cues to stop for safety  Ambulation/Gait Ambulation/Gait assistance: Min guard;Supervision Gait Distance (Feet): 160 Feet Assistive device: Rolling walker (2 wheeled)   Gait velocity: decreased   General Gait Details: generally steady gait despite impulsivity.  does need seated rest break in wheelchair   Stairs             Wheelchair Mobility    Modified Rankin (Stroke Patients Only)       Balance Overall balance assessment: Needs assistance Sitting-balance support: Feet supported;No upper extremity supported Sitting balance-Leahy Scale: Good     Standing balance support: Bilateral upper extremity supported Standing balance-Leahy Scale: Fair Standing balance comment: RW for stability but overall does well                            Cognition Arousal/Alertness: Awake/alert Behavior During Therapy: Agitated;Impulsive Overall Cognitive Status: Within Functional Limits for tasks assessed  General Comments: easily agitated despite attemtps to cater to her demands, volitile behavior and can be agressive at times during session not following requests for safety instuction      Exercises      General Comments        Pertinent Vitals/Pain Pain Assessment: No/denies pain    Home Living                      Prior Function            PT Goals (current goals can now be found in the care plan section) Progress towards PT goals: Progressing toward goals    Frequency    Min 2X/week      PT Plan Current plan remains  appropriate    Co-evaluation              AM-PAC PT "6 Clicks" Mobility   Outcome Measure  Help needed turning from your back to your side while in a flat bed without using bedrails?: None Help needed moving from lying on your back to sitting on the side of a flat bed without using bedrails?: None Help needed moving to and from a bed to a chair (including a wheelchair)?: A Little Help needed standing up from a chair using your arms (e.g., wheelchair or bedside chair)?: A Little Help needed to walk in hospital room?: A Little Help needed climbing 3-5 steps with a railing? : A Little 6 Click Score: 20    End of Session Equipment Utilized During Treatment: Gait belt Activity Tolerance: Patient tolerated treatment well;Patient limited by fatigue Patient left: with nursing/sitter in room;in bed;with bed alarm set;with call bell/phone within reach Nurse Communication: Mobility status PT Visit Diagnosis: Muscle weakness (generalized) (M62.81);Difficulty in walking, not elsewhere classified (R26.2)     Time: 5997-7414 PT Time Calculation (min) (ACUTE ONLY): 18 min  Charges:  $Gait Training: 8-22 mins                    Chesley Noon, PTA 11/11/19, 10:05 AM

## 2019-11-12 ENCOUNTER — Ambulatory Visit: Payer: Medicaid Other | Admitting: Family

## 2019-11-12 DIAGNOSIS — K652 Spontaneous bacterial peritonitis: Secondary | ICD-10-CM | POA: Diagnosis not present

## 2019-11-12 DIAGNOSIS — Z515 Encounter for palliative care: Secondary | ICD-10-CM | POA: Diagnosis not present

## 2019-11-12 DIAGNOSIS — Z7189 Other specified counseling: Secondary | ICD-10-CM | POA: Diagnosis not present

## 2019-11-12 DIAGNOSIS — I1310 Hypertensive heart and chronic kidney disease without heart failure, with stage 1 through stage 4 chronic kidney disease, or unspecified chronic kidney disease: Secondary | ICD-10-CM | POA: Diagnosis not present

## 2019-11-12 LAB — CBC WITH DIFFERENTIAL/PLATELET
Abs Immature Granulocytes: 0.03 10*3/uL (ref 0.00–0.07)
Basophils Absolute: 0.1 10*3/uL (ref 0.0–0.1)
Basophils Relative: 1 %
Eosinophils Absolute: 0.4 10*3/uL (ref 0.0–0.5)
Eosinophils Relative: 5 %
HCT: 24.5 % — ABNORMAL LOW (ref 36.0–46.0)
Hemoglobin: 8 g/dL — ABNORMAL LOW (ref 12.0–15.0)
Immature Granulocytes: 0 %
Lymphocytes Relative: 12 %
Lymphs Abs: 0.9 10*3/uL (ref 0.7–4.0)
MCH: 23.3 pg — ABNORMAL LOW (ref 26.0–34.0)
MCHC: 32.7 g/dL (ref 30.0–36.0)
MCV: 71.2 fL — ABNORMAL LOW (ref 80.0–100.0)
Monocytes Absolute: 0.9 10*3/uL (ref 0.1–1.0)
Monocytes Relative: 12 %
Neutro Abs: 5.2 10*3/uL (ref 1.7–7.7)
Neutrophils Relative %: 70 %
Platelets: 467 10*3/uL — ABNORMAL HIGH (ref 150–400)
RBC: 3.44 MIL/uL — ABNORMAL LOW (ref 3.87–5.11)
RDW: 19.4 % — ABNORMAL HIGH (ref 11.5–15.5)
WBC: 7.4 10*3/uL (ref 4.0–10.5)
nRBC: 0 % (ref 0.0–0.2)

## 2019-11-12 LAB — COMPREHENSIVE METABOLIC PANEL
ALT: 11 U/L (ref 0–44)
AST: 26 U/L (ref 15–41)
Albumin: 2.2 g/dL — ABNORMAL LOW (ref 3.5–5.0)
Alkaline Phosphatase: 306 U/L — ABNORMAL HIGH (ref 38–126)
Anion gap: 10 (ref 5–15)
BUN: 48 mg/dL — ABNORMAL HIGH (ref 8–23)
CO2: 26 mmol/L (ref 22–32)
Calcium: 8.5 mg/dL — ABNORMAL LOW (ref 8.9–10.3)
Chloride: 92 mmol/L — ABNORMAL LOW (ref 98–111)
Creatinine, Ser: 3.12 mg/dL — ABNORMAL HIGH (ref 0.44–1.00)
GFR, Estimated: 16 mL/min — ABNORMAL LOW (ref >60)
Glucose, Bld: 112 mg/dL — ABNORMAL HIGH (ref 70–99)
Potassium: 4.2 mmol/L (ref 3.5–5.1)
Sodium: 128 mmol/L — ABNORMAL LOW (ref 135–145)
Total Bilirubin: 0.8 mg/dL (ref 0.3–1.2)
Total Protein: 5.8 g/dL — ABNORMAL LOW (ref 6.5–8.1)

## 2019-11-12 LAB — PHOSPHORUS: Phosphorus: 3.5 mg/dL (ref 2.5–4.6)

## 2019-11-12 LAB — MAGNESIUM: Magnesium: 1.7 mg/dL (ref 1.7–2.4)

## 2019-11-12 MED ORDER — DIPHENHYDRAMINE HCL 25 MG PO CAPS
25.0000 mg | ORAL_CAPSULE | Freq: Four times a day (QID) | ORAL | Status: DC | PRN
  Administered 2019-11-13 – 2019-11-14 (×2): 25 mg via ORAL
  Filled 2019-11-12 (×3): qty 1

## 2019-11-12 NOTE — Progress Notes (Addendum)
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 Hospitalization complicated by need for dialysis and aspiration pneumonia. She did require dialysis during hospitalization but tolerated poorly with hypotension.  Katelyn Lane is awake and alert and interactive. She is more difficult to follow today as she is easily distracted and very tangential again today. Her recall does not seem as good today as compared to yesterday. She has me cover her legs and complains of being cold. She tells me that her brother visited with her yesterday and brought her [facility]. She seemed happy to see him and smiles as she shares this. She seems to keep her family at a distance but also seems to enjoy the calls and visits but does not want them too involved in her issues or health.   I attempted to speak with her about going to facility. She becomes very agitated and tells me "I ain't going to no [place]." She says she will be willing for facility placement but only here in [place]. She would not speak about this anymore.   She was easily agitated today and I did not feel she was in a place to discuss hospice options. I feel she is not yet hospice facility appropriate. I feel she could easily decline to become hospice facility appropriate in the near future. She does not seem ready for full comfort care but rather no desire for invasive interventions with dialysis or resuscitation but continue with medication management. She continues with good intake and working with therapy and her functional status is actually very good considering the severity of her underlying  illness. Because of this I feel she can likely compensate for a little time (difficult to know how long) but certainly high risk for acute decompensation and when this occurs would recommend hospice care.   Exam: Alert, oriented but more tangential speech today and difficult to focus on conversation. No distress. Breathing regular, unlabored. Easily agitated during conversation. Abd distended but soft.   Plan: - Recommend SNF rehab with palliative.  - No plans for further dialysis.  - Hospice if she declines.  - GOC difficult as her orientation fluctuates but she is at her best in the mornings.   [place], NP Palliative Medicine Team Pager [phone] (Please see [email or web address] for schedule) Team Phone [phone]    Greater than 50%  of this time was spent counseling and coordinating care related to the above assessment and plan

## 2019-11-12 NOTE — TOC Progression Note (Addendum)
Transition of Care [facility]) - Progression Note    Patient Details  Name: Katelyn Lane MRN: [id number] Date of Birth: [birth year]  Transition of Care [facility] CM/SW [place], RN Phone Number: 11/12/2019, 9:07 AM  Clinical Narrative:   RNCM met with patient at bedside, patient initially reporting she didn't want to talk due to eating but then continued to talk with this CM. This RNCM reported to patient that we would be discussing SNF placement. Patient immediately reporting that she would not be going to any place in [place]. She reports that she is from [facility] and will stay here. Attempted to discuss that at this time there is not a facility in [place] willing to take her but patient reports she will go sleep in her car before she leaves the county.   11:00: Update Raymond Gurney with DSS stopped by to speak with patient. She also tried to engage patient to encourage her to accept bed in [place] but without success. She asked that she be updated with plan.          Expected Discharge Plan and Services                                                 Social Determinants of Health (SDOH) Interventions    Readmission Risk Interventions Readmission Risk Prevention Plan 09/02/2019 08/14/2019 07/27/2019  Transportation Screening Complete Complete Complete  Medication Review Press photographer) Complete Referral to Pharmacy -  PCP or Specialist appointment within 3-5 days of discharge Complete Complete Complete  HRI or Home Care Consult - Complete Complete  SW Recovery Care/Counseling Consult - Complete Complete  Palliative Care Screening Not Applicable Not Applicable Not Applicable  Skilled Nursing Facility Complete Not Applicable Not Applicable  Some recent data might be hidden

## 2019-11-12 NOTE — Progress Notes (Addendum)
PROGRESS NOTE    Katelyn Lane  GMW:[id number] DOB: 05-29-1955 DOA: 10/19/2019 PCP: [name], MD    Chief Complaint  Patient presents with  . 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 Found to have BNP greater than 4500, elevated troponin and AKI.  Multiple prior admissions with similar symptoms and complaints. Prolonged hospitalization in August 2021 when she was discharged to SNF. Not clear when she get out of SNF. Continues to complain that she is homeless but per social worker she do have a house. Not take her medication despite being provided with medical management.  After admission to the hospital, she was diagnosed with acute on chronic combinedsystolicanddiastoliccongestiveheartfailurewith ejection fraction 25 to 30%. She was not responding to IV Lasix. She was seen by nephrology, temporary dialysis catheter was performed and she was dialyzed on 10/17 and 10/18. Patient also complaining of abdominal pain, she had a paracentesis, study showed possible spontaneous peritonitis, she was treated with Rocephin and Flagyl.  Patient had a worsening respiratory status on 10/17, was diagnosed with aspiration pneumonia. She was treated with Rocephin, Zithromax and Flagyl. Patient also had abdominal pain and a CT scan suspect ischemia, but seen by general surgery on 10/19, no need for surgery.  10/21.Repeat paracentesis performed, antibiotic changed to Rocephin only. Permacath placed.  10/22.Repeated paracentesis showed white cell count of 3048, 69% neutrophils. However, patient clinically improving. Continue Rocephin  10/26.Repeated paracentesis  showed white cell count of 1636.  10/27: The patient was unable to tolerate ultrafiltration due to hypotension.   11/09/2019: no acute need for dialysis now. Palliative care has been consulted and is talking with the patient.  11/10/2019: Pt with hypotension and Na of 128 this am. Morning dose of Lasix 60 mg IV was held. After discussion with nephrology, the lasix dose was reduced to 20 mg to limit need to hold doses of diuretic in the future.  Assessment & Plan:   Principal Problem:   Spontaneous bacterial peritonitis ([place]) Active Problems:   AKI (acute kidney injury) ([place])   Crack cocaine use   Acute respiratory failure with hypoxia (HCC)   LV (left ventricular) mural thrombus   Nonsustained ventricular tachycardia (HCC)   Acute CHF (congestive heart failure) (HCC)   Cocaine abuse ([place])   Peripheral edema   Cirrhosis of liver (HCC)   Dementia (HCC)   Aspiration pneumonia of both lower lobes due to gastric secretions (HCC)   Acute diastolic (congestive) heart failure (HCC)   Cardiorenal disease   Goals of care, counseling/discussion   Palliative care by specialist  Spontaneous bacterial peritonitis: Repeat paracentesis showed significant decrease of white cell in ascites, antibiotics effective. Hoever she still has a lot of neutrophils in 11/03/2019 fluid. Culture has been negative. Pt received many days of Rocephin, since discontinued. Monitor. --consider cipro for ppx   Acute on chronic combined systolic and diastolic congestive heart failure. Acute hypoxemic respiratory failure.   She was dialyzed x2, but now getting diuresed.  Pt reported does not want to get dialysis again. --cont IV lasix 20 mg BID --nephrology contacted today about diuretic plan on discharge  Liver cirrhosis with ascites: Noted. GI consulted. MELD 28.   --consider SBP ppx  Acute renal failure on chronic kidney disease stage IIIb: baseline creatinine  appears to be 1.66.  Due to cardiorenal  syndrome.  Creatinine peaked at 4.16, improved recently, though worsening to relatively stable in past few days.Renal function appears to be improving, although creatinine up slightly for the last 2 days.  No plan for further dialysis per renal.  Pt reportedly does not want dialysis again --cont IV lasix 20 mg BID --nephrology contacted today about diuretic plan on discharge --NPO MN for possible PermCath removal tomorrow (need to message vascular in the morning).  Hyponatremia: Mild. Lasix dosage reduced. Continue to monitor.  Left ventricular thrombus with history of CVA. Patient was initially concerned for possible GI bleed, anticoagulation was on hold. However, there is no GI bleed observed while in the hospital. GI was not concerned about it. It is not safe to continue hold anticoagulation. Anticoagulation has now been restarted with eliquis.  --Monitor hemoglobin. --cont Eliquis  Overgrown toenails: will need outpatient podiatry follow up  Anemia of chronic disease: Hemoglobin still stable. Patient had a normal B12 level in July as well as folate, but she also has mild iron deficiency.  --cont iron supplement  Hypokalemia: Resolved. Monitor.   DVT prophylaxis: eliquis Code Status: dnr Palliative care following Family Communication:  Disposition:   Status is: Inpatient  Remains inpatient appropriate because:Inpatient level of care appropriate due to severity of illness   Dispo: The patient is from: Home              Anticipated d/c is to: undetermined.  rec SNF, however, no bed offer in [place]  Pt refused to consider any place in [place].                Anticipated d/c date is: > 3 days              Patient currently is not medically stable to d/c.  Still needs to remove PermCath, and safe disposition undetermined.    Consultants:   Palliative  nephrology  Procedures: 10/20 perma cath insertion  Antimicrobials: Anti-infectives (From admission,  onward)   Start     Dose/Rate Route Frequency Ordered Stop   11/01/19 1000  cefTRIAXone (ROCEPHIN) 2 g in sodium chloride 0.9 % 100 mL IVPB  Status:  Discontinued        2 g 200 mL/hr over 30 Minutes Intravenous Daily 10/31/19 1521 11/09/19 1425   10/27/19 1000  cefTRIAXone (ROCEPHIN) 2 g in sodium chloride 0.9 % 100 mL IVPB  Status:  Discontinued        2 g 200 mL/hr over 30 Minutes Intravenous Daily 10/26/19 1559 10/31/19 1521   10/26/19 2000  cefTRIAXone (ROCEPHIN) 1 g in sodium chloride 0.9 % 100 mL IVPB        1 g 200 mL/hr over 30 Minutes Intravenous  Once 10/26/19 1559 10/26/19 2024   10/26/19 1600  cefTRIAXone (ROCEPHIN) 2 g in sodium chloride 0.9 % 100 mL IVPB  Status:  Discontinued        2 g 200 mL/hr over 30 Minutes Intravenous Every 24 hours 10/26/19 1351 10/26/19 1600   10/26/19 1000  metroNIDAZOLE (FLAGYL) IVPB 500 mg  Status:  Discontinued        500 mg 100 mL/hr over 60 Minutes Intravenous Every 8 hours 10/26/19 0909 10/29/19 1138   10/25/19 1445  azithromycin (ZITHROMAX) 500 mg in sodium chloride 0.9 % 250 mL IVPB  Status:  Discontinued        500 mg 250 mL/hr over 60 Minutes Intravenous Every 24 hours 10/25/19 1353  10/29/19 1138   10/25/19 1230  azithromycin (ZITHROMAX) tablet 500 mg  Status:  Discontinued        500 mg Oral Daily 10/25/19 1133 10/25/19 1134   10/25/19 1030  cefTRIAXone (ROCEPHIN) 1 g in sodium chloride 0.9 % 100 mL IVPB  Status:  Discontinued        1 g 200 mL/hr over 30 Minutes Intravenous Every 12 hours 10/25/19 0931 10/26/19 1351     Subjective: Pt reported eating a lot.  Dyspnea improved with suppl O2.  Became very agitated when asked about discharge disposition.     Objective: Vitals:   11/12/19 0500 11/12/19 0752 11/12/19 1209 11/12/19 1538  BP: 95/72 (!) 111/92 104/78 (!) 110/92  Pulse: 93 88 91 91  Resp: 17 19 19 19   Temp: 97.6 F (36.4 C) 98.2 F (36.8 C) 98.2 F (36.8 C) 98.4 F (36.9 C)  TempSrc:  Oral Oral Oral  SpO2: 100%  100% 98% 96%  Weight:      Height:       No intake or output data in the 24 hours ending 11/12/19 1618 Filed Weights   11/08/19 0644 11/09/19 0219 11/11/19 0245  Weight: 95.7 kg 95 kg 95.1 kg    Examination:  Constitutional: NAD, AAOx3 HEENT: conjunctivae and lids normal, EOMI CV: No cyanosis.   RESP: normal respiratory effort, on 2L SKIN: warm, dry and intact Neuro: II - XII grossly intact.   Psych: Labile mood and affect.     Data Reviewed: I have personally reviewed following labs and imaging studies  CBC: Recent Labs  Lab 11/08/19 0316 11/09/19 0418 11/10/19 0409 11/11/19 0433 11/12/19 0618  WBC 8.7 6.5 6.2 6.5 7.4  NEUTROABS 6.0 4.1 4.1 4.2 5.2  HGB 8.1* 8.1* 7.8* 8.0* 8.0*  HCT 25.1* 25.1* 24.8* 24.2* 24.5*  MCV 72.5* 71.3* 72.9* 71.2* 71.2*  PLT 458* 491* 508* 491* 467*    Basic Metabolic Panel: Recent Labs  Lab 11/06/19 0453 11/06/19 0453 11/08/19 0316 11/09/19 0418 11/10/19 0409 11/11/19 0433 11/12/19 0618  NA 133*   < > 132* 130* 128* 129* 128*  K 3.8   < > 4.1 3.8 3.8 4.5 4.2  CL 95*   < > 94* 94* 92* 93* 92*  CO2 28   < > 26 28 27 27 26   GLUCOSE 100*   < > 97 104* 104* 107* 112*  BUN 38*   < > 35* 36* 39* 45* 48*  CREATININE 2.48*   < > 2.70* 2.90* 3.09* 3.14* 3.12*  CALCIUM 8.7*   < > 8.7* 8.6* 8.5* 8.6* 8.5*  MG  --   --   --   --   --   --  1.7  PHOS 2.9  --   --   --   --   --  3.5   < > = values in this interval not displayed.    GFR: Estimated Creatinine Clearance: 21.4 mL/min (A) (by C-G formula based on SCr of 3.12 mg/dL (H)).  Liver Function Tests: Recent Labs  Lab 11/11/19 0433 11/12/19 0618  AST 24 26  ALT 10 11  ALKPHOS 312* 306*  BILITOT 0.8 0.8  PROT 5.8* 5.8*  ALBUMIN 2.1* 2.2*    CBG: Recent Labs  Lab 11/11/19 0024  GLUCAP 168*     No results found for this or any previous visit (from the past 240 hour(s)).       Radiology Studies: No results found.  Scheduled Meds: . apixaban  5 mg  Oral BID  . Chlorhexidine Gluconate Cloth  6 each Topical Daily  . feeding supplement (NEPRO CARB STEADY)  237 mL Oral Q24H  . furosemide  20 mg Intravenous BID  . iron polysaccharides  150 mg Oral Daily  . mouth rinse  15 mL Mouth Rinse BID  . melatonin  2.5 mg Oral QHS  . midodrine  10 mg Oral TID WC  . multivitamin  1 tablet Oral QHS  . pantoprazole  40 mg Oral BID  . sodium chloride flush  3 mL Intravenous Q12H   Continuous Infusions: . sodium chloride Stopped (11/01/19 1131)     LOS: 24 days     Enzo Bi, MD [facility]   To contact the attending provider between 7A-7P or the covering provider during after hours 7P-7A, please log into the web site [email or web address] and access using universal [place] password for that web site. If you do not have the password, please call the hospital operator.  11/12/2019, 4:18 PM

## 2019-11-13 ENCOUNTER — Encounter
Admission: EM | Disposition: A | Discharge status: Home / Self Care | Payer: Self-pay | Source: Home / Self Care | Attending: Internal Medicine

## 2019-11-13 ENCOUNTER — Encounter: Payer: Self-pay | Admitting: Vascular Surgery

## 2019-11-13 DIAGNOSIS — K652 Spontaneous bacterial peritonitis: Secondary | ICD-10-CM | POA: Diagnosis not present

## 2019-11-13 DIAGNOSIS — N186 End stage renal disease: Secondary | ICD-10-CM

## 2019-11-13 DIAGNOSIS — Z992 Dependence on renal dialysis: Secondary | ICD-10-CM | POA: Diagnosis not present

## 2019-11-13 HISTORY — PX: DIALYSIS/PERMA CATHETER REMOVAL: CATH118289

## 2019-11-13 LAB — BASIC METABOLIC PANEL
Anion gap: 11 (ref 5–15)
BUN: 51 mg/dL — ABNORMAL HIGH (ref 8–23)
CO2: 25 mmol/L (ref 22–32)
Calcium: 9.2 mg/dL (ref 8.9–10.3)
Chloride: 92 mmol/L — ABNORMAL LOW (ref 98–111)
Creatinine, Ser: 3.25 mg/dL — ABNORMAL HIGH (ref 0.44–1.00)
GFR, Estimated: 15 mL/min — ABNORMAL LOW (ref >60)
Glucose, Bld: 91 mg/dL (ref 70–99)
Potassium: 4.6 mmol/L (ref 3.5–5.1)
Sodium: 128 mmol/L — ABNORMAL LOW (ref 135–145)

## 2019-11-13 LAB — CBC
HCT: 24.7 % — ABNORMAL LOW (ref 36.0–46.0)
Hemoglobin: 7.7 g/dL — ABNORMAL LOW (ref 12.0–15.0)
MCH: 22.6 pg — ABNORMAL LOW (ref 26.0–34.0)
MCHC: 31.2 g/dL (ref 30.0–36.0)
MCV: 72.4 fL — ABNORMAL LOW (ref 80.0–100.0)
Platelets: 463 10*3/uL — ABNORMAL HIGH (ref 150–400)
RBC: 3.41 MIL/uL — ABNORMAL LOW (ref 3.87–5.11)
RDW: 19.5 % — ABNORMAL HIGH (ref 11.5–15.5)
WBC: 7.5 10*3/uL (ref 4.0–10.5)
nRBC: 0 % (ref 0.0–0.2)

## 2019-11-13 LAB — HEPATIC FUNCTION PANEL
ALT: 11 U/L (ref 0–44)
AST: 26 U/L (ref 15–41)
Albumin: 2.2 g/dL — ABNORMAL LOW (ref 3.5–5.0)
Alkaline Phosphatase: 317 U/L — ABNORMAL HIGH (ref 38–126)
Bilirubin, Direct: 0.4 mg/dL — ABNORMAL HIGH (ref 0.0–0.2)
Indirect Bilirubin: 0.6 mg/dL (ref 0.3–0.9)
Total Bilirubin: 1 mg/dL (ref 0.3–1.2)
Total Protein: 5.9 g/dL — ABNORMAL LOW (ref 6.5–8.1)

## 2019-11-13 LAB — MAGNESIUM: Magnesium: 1.8 mg/dL (ref 1.7–2.4)

## 2019-11-13 SURGERY — DIALYSIS/PERMA CATHETER REMOVAL
Anesthesia: LOCAL

## 2019-11-13 MED ORDER — LIDOCAINE-EPINEPHRINE (PF) 1 %-1:200000 IJ SOLN
INTRAMUSCULAR | Status: DC | PRN
  Administered 2019-11-13: 20 mL via INTRADERMAL

## 2019-11-13 MED ORDER — CIPROFLOXACIN HCL 500 MG PO TABS
500.0000 mg | ORAL_TABLET | Freq: Every day | ORAL | Status: DC
  Administered 2019-11-14 – 2020-01-28 (×71): 500 mg via ORAL
  Filled 2019-11-13 (×77): qty 1

## 2019-11-13 SURGICAL SUPPLY — 3 items
CHLORAPREP W/TINT 26 (MISCELLANEOUS) ×2 IMPLANT
FORCEPS HALSTEAD CVD 5IN STRL (INSTRUMENTS) ×2 IMPLANT
TRAY LACERAT/PLASTIC (MISCELLANEOUS) ×2 IMPLANT

## 2019-11-13 NOTE — Progress Notes (Signed)
PROGRESS NOTE    Katelyn Lane  PZW:[id number] DOB: [birth year] DOA: 10/19/2019 PCP: [name], MD  Chief Complaint  Patient presents with  . 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 Found to have BNP greater than 4500, elevated troponin and AKI.  Multiple prior admissions with similar symptoms and complaints. Prolonged hospitalization in August 2021 when she was discharged to SNF. Not clear when she get out of SNF. Continues to complain that she is homeless but per social worker she do have Katelyn Lane house. Not take her medication despite being provided with medical management.  After admission to the hospital, she was diagnosed with acute on chronic combinedsystolicanddiastoliccongestiveheartfailurewith ejection fraction 25 to 30%. She was not responding to IV Lasix. She was seen by nephrology, temporary dialysis catheter was performed and she was dialyzed on 10/17 and 10/18.Patient also complaining of abdominal pain, she had Katelyn Lane paracentesis, study showed possible spontaneous peritonitis, she was treated with Rocephin and Flagyl.  Patient had Katelyn Lane worsening respiratory status on 10/17, was diagnosed with aspiration pneumonia. She was treated with Rocephin, Zithromax and Flagyl. Patient also had abdominal pain and Katelyn Lane CT scan suspect ischemia, but seen by general surgery on 10/19, no need for surgery.  10/21.Repeat paracentesis performed, antibiotic changed to Rocephin only. Permacath placed.  10/22.Repeated paracentesis showed white cell count of 3048, 69% neutrophils. However, patient clinically improving. Continue Rocephin  10/26.Repeated paracentesis showed  white cell count of 1636.  10/27: The patient was unable to tolerate ultrafiltration due to hypotension.   11/09/2019: no acute need for dialysis now. Palliative care has been consulted and is talking with the patient.  11/10/2019: Pt with hypotension and Na of 128 this am. Morning dose of Lasix 60 mg IV was held. After discussion with nephrology, the lasix dose was reduced to 20 mg to limit need to hold doses of diuretic in the future.  Assessment & Plan:   Principal Problem:   Spontaneous bacterial peritonitis ([place]) Active Problems:   AKI (acute kidney injury) ([place])   Crack cocaine use   Acute respiratory failure with hypoxia (HCC)   LV (left ventricular) mural thrombus   Nonsustained ventricular tachycardia (HCC)   Acute CHF (congestive heart failure) (HCC)   Cocaine abuse ([place])   Peripheral edema   Cirrhosis of liver (HCC)   Dementia (HCC)   Aspiration pneumonia of both lower lobes due to gastric secretions (HCC)   Acute diastolic (congestive) heart failure (HCC)   Cardiorenal disease   Goals of care, counseling/discussion   Palliative care by specialist  Spontaneous bacterial peritonitis: Repeat paracentesis showed significant decrease of white cell in ascites, antibiotics effective. Hoever she still has Katelyn Lane lot of neutrophils in 11/03/2019 fluid. Culture has been negative. Pt received many days of Rocephin, since discontinued. Monitor. --consider cipro for ppx   Acute on chronic combined systolic and diastolic congestive heart failure. Acute hypoxemic respiratory failure.       She was dialyzed x2, but now getting diuresed.  Pt reported does not want to get dialysis again. --cont IV lasix 20 mg BID --nephrology contacted today about diuretic plan on discharge (they recommend 40 mg PO lasix BID)  Liver cirrhosis with ascites: Noted. GI consulted previously in hospitalization. MELD 28.   --consider SBP ppx  Acute renal failure on chronic kidney disease stage IIIb:  baseline creatinine appears to be 1.66.  Due to cardiorenal syndrome. Creatinine peaked at 4.16, improved recently, though worsening to relatively stable in past few days.Renal function appears to be improving, although creatinine up slightly for the last 2 days.       No plan for further dialysis per renal.  Pt reportedly does not want dialysis again --cont IV lasix 20 mg BID for now, creatinine bumped today --nephrology contacted today about diuretic plan on discharge --removal of R permacath on 11/5  Hyponatremia: Mild. Lasix dosage reduced. Continue to monitor.  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 However, there is no GI bleed observed while in the hospital. GI was not concerned about it. It is not safe to continue hold anticoagulation. Anticoagulation has now been restarted with eliquis.  --Monitor hemoglobin. --cont Eliquis  Overgrown toenails: will need outpatient podiatry follow up  Anemia of chronic disease: Hemoglobin still stable. Patient had Katelyn Lane normal B12 level in July as well as folate, but she also has mild iron deficiency.  --cont iron supplement  Hypokalemia:Resolved. Monitor.  DVT prophylaxis: eliquis Code Status: dnr Family Communication: declined me calling Disposition:   Status is: Inpatient  Remains inpatient appropriate because:Inpatient level of care appropriate due to severity of illness   Dispo: The patient is from: Home              Anticipated d/c is to: pending              Anticipated d/c date is: > 3 days              Patient currently is not medically stable to d/c.  Consultants:   Palliative  Renal  GI  Procedures: 10/20 perma cath insertion perma cath removal 11/5  Antimicrobials:  Anti-infectives (From admission, onward)   Start     Dose/Rate Route Frequency Ordered Stop   11/01/19 1000  cefTRIAXone (ROCEPHIN) 2 g in sodium  chloride 0.9 % 100 mL IVPB  Status:  Discontinued        2 g 200 mL/hr over 30 Minutes Intravenous Daily 10/31/19 1521 11/09/19 1425   10/27/19 1000  cefTRIAXone (ROCEPHIN) 2 g in sodium chloride 0.9 % 100 mL IVPB  Status:  Discontinued        2 g 200 mL/hr over 30 Minutes Intravenous Daily 10/26/19 1559 10/31/19 1521   10/26/19 2000  cefTRIAXone (ROCEPHIN) 1 g in sodium chloride 0.9 % 100 mL IVPB        1 g 200 mL/hr over 30 Minutes Intravenous  Once 10/26/19 1559 10/26/19 2024   10/26/19 1600  cefTRIAXone (ROCEPHIN) 2 g in sodium chloride 0.9 % 100 mL IVPB  Status:  Discontinued        2 g 200 mL/hr over 30 Minutes Intravenous Every 24 hours 10/26/19 1351 10/26/19 1600   10/26/19 1000  metroNIDAZOLE (FLAGYL) IVPB 500 mg  Status:  Discontinued        500 mg 100 mL/hr over 60 Minutes Intravenous Every 8 hours 10/26/19 0909 10/29/19 1138   10/25/19 1445  azithromycin (ZITHROMAX) 500 mg in sodium chloride 0.9 % 250 mL IVPB  Status:  Discontinued        500 mg 250 mL/hr over 60 Minutes Intravenous Every 24 hours 10/25/19 1353 10/29/19 1138   10/25/19 1230  azithromycin (ZITHROMAX) tablet 500 mg  Status:  Discontinued  500 mg Oral Daily 10/25/19 1133 10/25/19 1134   10/25/19 1030  cefTRIAXone (ROCEPHIN) 1 g in sodium chloride 0.9 % 100 mL IVPB  Status:  Discontinued        1 g 200 mL/hr over 30 Minutes Intravenous Every 12 hours 10/25/19 0931 10/26/19 1351         Subjective: No new complaints  Objective: Vitals:   11/13/19 1150 11/13/19 1226 11/13/19 1254 11/13/19 1600  BP: 108/72 100/83 90/62 100/75  Pulse: 86 73 86 80  Resp: 16 18 20 19   Temp: 98.7 F (37.1 C) 98.2 F (36.8 C)  98.4 F (36.9 C)  TempSrc: Oral Oral  Oral  SpO2: 100% 98% 96% 100%  Weight:  101.9 kg    Height:  5\' 6"  (1.676 m)      Intake/Output Summary (Last 24 hours) at 11/13/2019 1929 Last data filed at 11/13/2019 1853 Gross per 24 hour  Intake 240 ml  Output 1 ml  Net 239 ml   Filed Weights    11/13/19 0500 11/13/19 0744 11/13/19 1226  Weight: 99.9 kg 101.9 kg 101.9 kg    Examination:  General: No acute distress. Cardiovascular: RRR Lungs: unlabored Abdomen: Soft, nontender, nondistended Neurological: Alert and oriented. Moves all extremities 4. Cranial nerves II through XII grossly intact. Skin: Warm and dry. No rashes or lesions. Extremities: No clubbing or cyanosis. No edema.  Data Reviewed: I have personally reviewed following labs and imaging studies  CBC: Recent Labs  Lab 11/08/19 0316 11/08/19 0316 11/09/19 0418 11/10/19 0409 11/11/19 0433 11/12/19 0618 11/13/19 0436  WBC 8.7   < > 6.5 6.2 6.5 7.4 7.5  NEUTROABS 6.0  --  4.1 4.1 4.2 5.2  --   HGB 8.1*   < > 8.1* 7.8* 8.0* 8.0* 7.7*  HCT 25.1*   < > 25.1* 24.8* 24.2* 24.5* 24.7*  MCV 72.5*   < > 71.3* 72.9* 71.2* 71.2* 72.4*  PLT 458*   < > 491* 508* 491* 467* 463*   < > = values in this interval not displayed.    Basic Metabolic Panel: Recent Labs  Lab 11/09/19 0418 11/10/19 0409 11/11/19 0433 11/12/19 0618 11/13/19 0436  NA 130* 128* 129* 128* 128*  K 3.8 3.8 4.5 4.2 4.6  CL 94* 92* 93* 92* 92*  CO2 28 27 27 26 25   GLUCOSE 104* 104* 107* 112* 91  BUN 36* 39* 45* 48* 51*  CREATININE 2.90* 3.09* 3.14* 3.12* 3.25*  CALCIUM 8.6* 8.5* 8.6* 8.5* 9.2  MG  --   --   --  1.7 1.8  PHOS  --   --   --  3.5  --     GFR: Estimated Creatinine Clearance: 21.3 mL/min (Dominika Losey) (by C-G formula based on SCr of 3.25 mg/dL (H)).  Liver Function Tests: Recent Labs  Lab 11/11/19 0433 11/12/19 0618 11/13/19 0436  AST 24 26 26   ALT 10 11 11   ALKPHOS 312* 306* 317*  BILITOT 0.8 0.8 1.0  PROT 5.8* 5.8* 5.9*  ALBUMIN 2.1* 2.2* 2.2*    CBG: Recent Labs  Lab 11/11/19 0024  GLUCAP 168*     No results found for this or any previous visit (from the past 240 hour(s)).       Radiology Studies: PERIPHERAL VASCULAR CATHETERIZATION  Result Date: 11/13/2019 See op note       Scheduled Meds: .  apixaban  5 mg Oral BID  . Chlorhexidine Gluconate Cloth  6 each Topical Daily  . feeding  supplement (NEPRO CARB STEADY)  237 mL Oral Q24H  . furosemide  20 mg Intravenous BID  . iron polysaccharides  150 mg Oral Daily  . mouth rinse  15 mL Mouth Rinse BID  . melatonin  2.5 mg Oral QHS  . midodrine  10 mg Oral TID WC  . multivitamin  1 tablet Oral QHS  . pantoprazole  40 mg Oral BID  . sodium chloride flush  3 mL Intravenous Q12H   Continuous Infusions: . sodium chloride Stopped (11/01/19 1131)     LOS: 25 days    Time spent: over 30 min    Fayrene Helper, MD [facility]   To contact the attending provider between 7A-7P or the covering provider during after hours 7P-7A, please log into the web site [email or web address] and access using universal [place] password for that web site. If you do not have the password, please call the hospital operator.  11/13/2019, 7:29 PM

## 2019-11-13 NOTE — Progress Notes (Signed)
PT Cancellation Note  Patient Details Name: Katelyn Lane MRN: [id number] DOB: [birth year]   Cancelled Treatment:     Chart reviewed. Pt having temporary dialysis catheter removed, unavailable for tx. Will f/u as able & as pt is medically appropriate.   Lavone Nian, PT, DPT 11/13/19, 1:38 PM    Katelyn Schooner 11/13/2019, 1:38 PM

## 2019-11-13 NOTE — Op Note (Signed)
Operative Note  Preoperative diagnosis:   1. ESRD with functional permanent access  Postoperative diagnosis:  1. ESRD with functional permanent access  Procedure:  Removal of right Permcath  Physician Assistant: Hezzie Bump PA-C  Surgeon:  Leotis Pain, MD  Anesthesia:  Local  EBL:  Minimal  Indication for the Procedure:  The patient has a functional permanent dialysis access and no longer needs their permcath.  This can be removed.  Risks and benefits are discussed and informed consent is obtained.  Description of the Procedure:  The patient's right neck, chest and existing catheter were sterilely prepped and draped. The area around the catheter was anesthetized copiously with 1% lidocaine. The catheter was dissected out with curved hemostats until the cuff was freed from the surrounding fibrous sheath. The fiber sheath was transected, and the catheter was then removed in its entirety using gentle traction. Pressure was held and sterile dressings were placed. The patient tolerated the procedure well and was taken to the recovery room in stable condition.  Issa Kosmicki A Sydny Schnitzler  11/13/2019, 12:44 PM   This note was created with Dragon Medical transcription system. Any errors in dictation are purely unintentional.

## 2019-11-13 NOTE — Interval H&P Note (Signed)
History and Physical Interval Note:  11/13/2019 12:43 PM  Katelyn Lane  has presented today for surgery, with the diagnosis of ESRD.  The various methods of treatment have been discussed with the patient and family. After consideration of risks, benefits and other options for treatment, the patient has consented to  Procedure(s): DIALYSIS/PERMA CATHETER REMOVAL (N/A) as a surgical intervention.  The patient's history has been reviewed, patient examined, no change in status, stable for surgery.  I have reviewed the patient's chart and labs.  Questions were answered to the patient's satisfaction.     [place]

## 2019-11-14 ENCOUNTER — Inpatient Hospital Stay: Payer: Medicaid Other

## 2019-11-14 DIAGNOSIS — K652 Spontaneous bacterial peritonitis: Secondary | ICD-10-CM | POA: Diagnosis not present

## 2019-11-14 LAB — PHOSPHORUS: Phosphorus: 4 mg/dL (ref 2.5–4.6)

## 2019-11-14 LAB — COMPREHENSIVE METABOLIC PANEL
ALT: 12 U/L (ref 0–44)
AST: 24 U/L (ref 15–41)
Albumin: 2.4 g/dL — ABNORMAL LOW (ref 3.5–5.0)
Alkaline Phosphatase: 328 U/L — ABNORMAL HIGH (ref 38–126)
Anion gap: 13 (ref 5–15)
BUN: 54 mg/dL — ABNORMAL HIGH (ref 8–23)
CO2: 24 mmol/L (ref 22–32)
Calcium: 9.4 mg/dL (ref 8.9–10.3)
Chloride: 91 mmol/L — ABNORMAL LOW (ref 98–111)
Creatinine, Ser: 3.44 mg/dL — ABNORMAL HIGH (ref 0.44–1.00)
GFR, Estimated: 14 mL/min — ABNORMAL LOW (ref >60)
Glucose, Bld: 102 mg/dL — ABNORMAL HIGH (ref 70–99)
Potassium: 4.8 mmol/L (ref 3.5–5.1)
Sodium: 128 mmol/L — ABNORMAL LOW (ref 135–145)
Total Bilirubin: 1.1 mg/dL (ref 0.3–1.2)
Total Protein: 6.7 g/dL (ref 6.5–8.1)

## 2019-11-14 LAB — CBC WITH DIFFERENTIAL/PLATELET
Abs Immature Granulocytes: 0.07 10*3/uL (ref 0.00–0.07)
Basophils Absolute: 0.1 10*3/uL (ref 0.0–0.1)
Basophils Relative: 1 %
Eosinophils Absolute: 0.3 10*3/uL (ref 0.0–0.5)
Eosinophils Relative: 4 %
HCT: 25.7 % — ABNORMAL LOW (ref 36.0–46.0)
Hemoglobin: 8.3 g/dL — ABNORMAL LOW (ref 12.0–15.0)
Immature Granulocytes: 1 %
Lymphocytes Relative: 15 %
Lymphs Abs: 1.2 10*3/uL (ref 0.7–4.0)
MCH: 23 pg — ABNORMAL LOW (ref 26.0–34.0)
MCHC: 32.3 g/dL (ref 30.0–36.0)
MCV: 71.2 fL — ABNORMAL LOW (ref 80.0–100.0)
Monocytes Absolute: 1 10*3/uL (ref 0.1–1.0)
Monocytes Relative: 12 %
Neutro Abs: 5.6 10*3/uL (ref 1.7–7.7)
Neutrophils Relative %: 67 %
Platelets: 526 10*3/uL — ABNORMAL HIGH (ref 150–400)
RBC: 3.61 MIL/uL — ABNORMAL LOW (ref 3.87–5.11)
RDW: 19.4 % — ABNORMAL HIGH (ref 11.5–15.5)
WBC: 8.3 10*3/uL (ref 4.0–10.5)
nRBC: 0 % (ref 0.0–0.2)

## 2019-11-14 LAB — PROTIME-INR
INR: 2.4 — ABNORMAL HIGH (ref 0.8–1.2)
Prothrombin Time: 25.5 seconds — ABNORMAL HIGH (ref 11.4–15.2)

## 2019-11-14 LAB — MAGNESIUM: Magnesium: 1.7 mg/dL (ref 1.7–2.4)

## 2019-11-14 MED ORDER — ACETAMINOPHEN 500 MG PO TABS
500.0000 mg | ORAL_TABLET | Freq: Four times a day (QID) | ORAL | Status: DC | PRN
  Administered 2019-11-16 – 2020-01-29 (×57): 500 mg via ORAL
  Filled 2019-11-14 (×60): qty 1

## 2019-11-14 NOTE — Progress Notes (Signed)
Pt caught trying to hide percocet after taking her other medication. Pt stated she thought she was taking too much medicine, that's why she was holding it. This nurse told her she cannot do that, she can either take it or we will discard it. Pt took her percocet at that time.

## 2019-11-14 NOTE — Progress Notes (Signed)
PROGRESS NOTE    Katelyn Lane  EZM:[id number] DOB: [birth year] DOA: 10/19/2019 PCP: Katelyn Burrow, MD  Chief Complaint  Patient presents with  . 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 Found to have BNP greater than 4500, elevated troponin and AKI.  Multiple prior admissions with similar symptoms and complaints. Prolonged hospitalization in August 2021 when she was discharged to SNF. Not clear when she get out of SNF. Continues to complain that she is homeless but per social worker she do have Katelyn Lane house. Not take her medication despite being provided with medical management.  After admission to the hospital, she was diagnosed with acute on chronic combinedsystolicanddiastoliccongestiveheartfailurewith ejection fraction 25 to 30%. She was not responding to IV Lasix. She was seen by nephrology, temporary dialysis catheter was performed and she was dialyzed on 10/17 and 10/18.Patient also complaining of abdominal pain, she had Katelyn Lane paracentesis, study showed possible spontaneous peritonitis, she was treated with Rocephin and Flagyl.  Patient had Katelyn Lane worsening respiratory status on 10/17, was diagnosed with aspiration pneumonia. She was treated with Rocephin, Zithromax and Flagyl. Patient also had abdominal pain and Katelyn Lane CT scan suspect ischemia, but seen by general surgery on 10/19, no need for surgery.  10/21.Repeat paracentesis performed, antibiotic changed to Rocephin only. Permacath placed.  10/22.Repeated paracentesis showed white cell count of 3048, 69% neutrophils. However, patient clinically improving. Continue Rocephin  10/26.Repeated paracentesis showed  white cell count of 1636.  10/27: The patient was unable to tolerate ultrafiltration due to hypotension.   11/09/2019: no acute need for dialysis now. Palliative care has been consulted and is talking with the patient.  11/10/2019: Pt with hypotension and Na of 128 this am. Morning dose of Lasix 60 mg IV was held. After discussion with nephrology, the lasix dose was reduced to 20 mg to limit need to hold doses of diuretic in the future.  Assessment & Plan:   Principal Problem:   Spontaneous bacterial peritonitis (Katelyn Lane) Active Problems:   AKI (acute kidney injury) (Katelyn Lane)   Crack cocaine use   Acute respiratory failure with hypoxia (Katelyn Lane)   LV (left ventricular) mural thrombus   Nonsustained ventricular tachycardia (Katelyn Lane)   Acute CHF (congestive heart failure) (Katelyn Lane)   Cocaine abuse (Katelyn Lane)   Peripheral edema   Cirrhosis of liver (Katelyn Lane)   Dementia (Katelyn Lane)   Aspiration pneumonia of both lower lobes due to gastric secretions (Katelyn Lane)   Acute diastolic (congestive) heart failure (Katelyn Lane)   Cardiorenal disease   Goals of care, counseling/discussion   Palliative care by specialist  Goals of care: appreciate palliative care involvement.  Complex situation overall.  SNF with palliative recommended.  She's now DNR with no plans for dialysis.  GOC are often difficult due to her orientation and limited insight at times.  Will discuss with palliative when they're available.  May need to reengage with psychiatry?  Spontaneous bacterial peritonitis: Repeat paracentesis showed significant decrease of white cell in ascites, antibiotics effective. Hoever she still has [name] lot of neutrophils in 11/03/2019 fluid. Culture has been negative. Pt received many days of Rocephin, since discontinued. Monitor. --cipro for ppx   Acute on chronic combined systolic and diastolic congestive heart failure. Acute hypoxemic respiratory failure.       She was dialyzed  x2, but now getting diuresed.  Pt reported does not want to  get dialysis again. --hold lasix with bump in creatinine -- repeat CXR today pending --nephrology contacted today about diuretic plan on discharge (they recommend 40 mg PO lasix BID)  Liver cirrhosis with ascites: Noted. GI consulted previously in hospitalization. MELD 28.   --cipro for SBP ppx  Acute renal failure on chronic kidney disease stage IIIb: baseline creatinine appears to be 1.66.  Due to cardiorenal syndrome. Creatinine peaked at 4.16, improved recently, though worsening to relatively stable in past few days.Renal function appears to be improving, although creatinine up slightly for the last 2 days.       No plan for further dialysis per renal.  Pt reportedly does not want dialysis again --hold lasix today, creatinine rising to 3.44, follow CXR --nephrology contacted today about diuretic plan on discharge --removal of R permacath on 11/5  Hyponatremia: Mild. Lasix dosage reduced. Continue to monitor.  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 However, there is no GI bleed observed while in the hospital. GI was not concerned about it. It is not safe to continue hold anticoagulation. Anticoagulation has now been restarted with eliquis.  --Monitor hemoglobin. --cont Eliquis  Overgrown toenails: will need outpatient podiatry follow up  Anemia of chronic disease: Hemoglobin still stable. Patient had [name] normal B12 level in July as well as folate, but she also has mild iron deficiency.  --cont iron supplement  Hypokalemia:Resolved. Monitor.  DVT prophylaxis: eliquis Code Status: dnr Family Communication: declined me calling Disposition:   Status is: Inpatient  Remains inpatient appropriate because:Inpatient level of care appropriate due to severity of illness   Dispo: The patient is from: Home              Anticipated d/c is to: pending              Anticipated d/c date  is: > 3 days              Patient currently is not medically stable to d/c.  Consultants:   Palliative  Renal  GI  Procedures: 10/20 perma cath insertion perma cath removal 11/5  Antimicrobials:  Anti-infectives (From admission, onward)   Start     Dose/Rate Route Frequency Ordered Stop   11/14/19 0800  ciprofloxacin (CIPRO) tablet 500 mg        500 mg Oral Daily with breakfast 11/13/19 1938     11/01/19 1000  cefTRIAXone (ROCEPHIN) 2 g in sodium chloride 0.9 % 100 mL IVPB  Status:  Discontinued        2 g 200 mL/hr over 30 Minutes Intravenous Daily 10/31/19 1521 11/09/19 1425   10/27/19 1000  cefTRIAXone (ROCEPHIN) 2 g in sodium chloride 0.9 % 100 mL IVPB  Status:  Discontinued        2 g 200 mL/hr over 30 Minutes Intravenous Daily 10/26/19 1559 10/31/19 1521   10/26/19 2000  cefTRIAXone (ROCEPHIN) 1 g in sodium chloride 0.9 % 100 mL IVPB        1 g 200 mL/hr over 30 Minutes Intravenous  Once 10/26/19 1559 10/26/19 2024   10/26/19 1600  cefTRIAXone (ROCEPHIN) 2 g in sodium chloride 0.9 % 100 mL IVPB  Status:  Discontinued        2 g 200 mL/hr over 30 Minutes Intravenous Every 24 hours 10/26/19 1351 10/26/19 1600   10/26/19 1000  metroNIDAZOLE (FLAGYL) IVPB 500  mg  Status:  Discontinued        500 mg 100 mL/hr over 60 Minutes Intravenous Every 8 hours 10/26/19 0909 10/29/19 1138   10/25/19 1445  azithromycin (ZITHROMAX) 500 mg in sodium chloride 0.9 % 250 mL IVPB  Status:  Discontinued        500 mg 250 mL/hr over 60 Minutes Intravenous Every 24 hours 10/25/19 1353 10/29/19 1138   10/25/19 1230  azithromycin (ZITHROMAX) tablet 500 mg  Status:  Discontinued        500 mg Oral Daily 10/25/19 1133 10/25/19 1134   10/25/19 1030  cefTRIAXone (ROCEPHIN) 1 g in sodium chloride 0.9 % 100 mL IVPB  Status:  Discontinued        1 g 200 mL/hr over 30 Minutes Intravenous Every 12 hours 10/25/19 0931 10/26/19 1351         Subjective: No new complaints Katelyn Lane&ox3  Objective: Vitals:    11/14/19 0515 11/14/19 0741 11/14/19 1132 11/14/19 1525  BP:  100/78 (!) 90/58 (!) 89/79  Pulse:  91 87 75  Resp:  17 17 16   Temp:  98 F (36.7 C) (!) 97.4 F (36.3 C) (!) 97.4 F (36.3 C)  TempSrc:  Oral Oral Oral  SpO2:  99% 100% 100%  Weight: 104.1 kg     Height:        Intake/Output Summary (Last 24 hours) at 11/14/2019 1736 Last data filed at 11/14/2019 0331 Gross per 24 hour  Intake 480 ml  Output --  Net 480 ml   Filed Weights   11/13/19 0744 11/13/19 1226 11/14/19 0515  Weight: 101.9 kg 101.9 kg 104.1 kg    Examination:  General: No acute distress. Cardiovascular: Heart sounds show Katelyn Lane regular rate, and rhythm.  Lungs: Clear to auscultation bilaterally  Abdomen: Soft, nontender, nondistended Neurological: Alert and oriented 3. Moves all extremities . Cranial nerves II through XII grossly intact. Skin: Warm and dry. No rashes or lesions. Extremities: No clubbing or cyanosis.bilateral LE edema.  Psychiatric: limited insight    Data Reviewed: I have personally reviewed following labs and imaging studies  CBC: Recent Labs  Lab 11/09/19 0418 11/09/19 0418 11/10/19 0409 11/11/19 0433 11/12/19 0618 11/13/19 0436 11/14/19 0358  WBC 6.5   < > 6.2 6.5 7.4 7.5 8.3  NEUTROABS 4.1  --  4.1 4.2 5.2  --  5.6  HGB 8.1*   < > 7.8* 8.0* 8.0* 7.7* 8.3*  HCT 25.1*   < > 24.8* 24.2* 24.5* 24.7* 25.7*  MCV 71.3*   < > 72.9* 71.2* 71.2* 72.4* 71.2*  PLT 491*   < > 508* 491* 467* 463* 526*   < > = values in this interval not displayed.    Basic Metabolic Panel: Recent Labs  Lab 11/10/19 0409 11/11/19 0433 11/12/19 0618 11/13/19 0436 11/14/19 0358  NA 128* 129* 128* 128* 128*  K 3.8 4.5 4.2 4.6 4.8  CL 92* 93* 92* 92* 91*  CO2 27 27 26 25 24   GLUCOSE 104* 107* 112* 91 102*  BUN 39* 45* 48* 51* 54*  CREATININE 3.09* 3.14* 3.12* 3.25* 3.44*  CALCIUM 8.5* 8.6* 8.5* 9.2 9.4  MG  --   --  1.7 1.8 1.7  PHOS  --   --  3.5  --  4.0    GFR: Estimated Creatinine  Clearance: 20.4 mL/min (Katelyn Lane) (by C-G formula based on SCr of 3.44 mg/dL (H)).  Liver Function Tests: Recent Labs  Lab 11/11/19 0433 11/12/19 0618 11/13/19 0436  11/14/19 0358  AST 24 26 26 24   ALT 10 11 11 12   ALKPHOS 312* 306* 317* 328*  BILITOT 0.8 0.8 1.0 1.1  PROT 5.8* 5.8* 5.9* 6.7  ALBUMIN 2.1* 2.2* 2.2* 2.4*    CBG: Recent Labs  Lab 11/11/19 0024  GLUCAP 168*     No results found for this or any previous visit (from the past 240 hour(s)).       Radiology Studies: PERIPHERAL VASCULAR CATHETERIZATION  Result Date: 11/13/2019 See op note       Scheduled Meds: . apixaban  5 mg Oral BID  . Chlorhexidine Gluconate Cloth  6 each Topical Daily  . ciprofloxacin  500 mg Oral Q breakfast  . feeding supplement (NEPRO CARB STEADY)  237 mL Oral Q24H  . iron polysaccharides  150 mg Oral Daily  . mouth rinse  15 mL Mouth Rinse BID  . melatonin  2.5 mg Oral QHS  . midodrine  10 mg Oral TID WC  . multivitamin  1 tablet Oral QHS  . pantoprazole  40 mg Oral BID  . sodium chloride flush  3 mL Intravenous Q12H   Continuous Infusions: . sodium chloride Stopped (11/01/19 1131)     LOS: 26 days    Time spent: over 30 min    Fayrene Helper, MD [facility]   To contact the attending provider between 7A-7P or the covering provider during after hours 7P-7A, please log into the web site [email or web address] and access using universal [place] password for that web site. If you do not have the password, please call the hospital operator.  11/14/2019, 5:36 PM

## 2019-11-14 NOTE — Plan of Care (Signed)
  Problem: Health Behavior/Discharge Planning: Goal: Ability to manage health-related needs will improve Outcome: Progressing   Problem: Clinical Measurements: Goal: Ability to maintain clinical measurements within normal limits will improve Outcome: Progressing Goal: Will remain free from infection Outcome: Progressing Goal: Diagnostic test results will improve Outcome: Progressing Goal: Respiratory complications will improve Outcome: Progressing Goal: Cardiovascular complication will be avoided Outcome: Progressing   Problem: Activity: Goal: Risk for activity intolerance will decrease Outcome: Progressing   Problem: Nutrition: Goal: Adequate nutrition will be maintained Outcome: Progressing   Problem: Coping: Goal: Level of anxiety will decrease Outcome: Progressing   Problem: Elimination: Goal: Will not experience complications related to bowel motility Outcome: Progressing Goal: Will not experience complications related to urinary retention Outcome: Progressing   Problem: Pain Managment: Goal: General experience of comfort will improve Outcome: Progressing   Problem: Safety: Goal: Ability to remain free from injury will improve Outcome: Progressing   Problem: Skin Integrity: Goal: Risk for impaired skin integrity will decrease Outcome: Progressing   Problem: Education: Goal: Ability to demonstrate management of disease process will improve Outcome: Progressing Goal: Ability to verbalize understanding of medication therapies will improve Outcome: Progressing Goal: Individualized Educational Video(s) Outcome: Progressing   Problem: Activity: Goal: Capacity to carry out activities will improve Outcome: Progressing   Problem: Cardiac: Goal: Ability to achieve and maintain adequate cardiopulmonary perfusion will improve Outcome: Progressing   Problem: Education: Goal: Knowledge of disease and its progression will improve Outcome: Progressing Goal:  Individualized Educational Video(s) Outcome: Progressing   Problem: Fluid Volume: Goal: Compliance with measures to maintain balanced fluid volume will improve Outcome: Progressing   Problem: Health Behavior/Discharge Planning: Goal: Ability to manage health-related needs will improve Outcome: Progressing   Problem: Nutritional: Goal: Ability to make healthy dietary choices will improve Outcome: Progressing   Problem: Clinical Measurements: Goal: Complications related to the disease process, condition or treatment will be avoided or minimized Outcome: Progressing

## 2019-11-14 NOTE — Progress Notes (Signed)
Physical Therapy Treatment Patient Details Name: Katelyn Lane MRN: [id number] DOB: 04/09/1955 Today's Date: 11/14/2019    History of Present Illness presented to ER secondary to LE edema, progressive SOB; admitted for management of acute/chronic CHF, spontaneous bacterial peritonitis (s/p paracentesis 10/25).  Hospital course also significant for initiation of dialysis (initially, R temp fem cath; converted to R permcath)    PT Comments    Therapist returned and with encouragement, pt agreeable to PT session. She continues to demonstrate impulsive behaviors and likes to perform task desired slowly. She was able to exit bed with supervision. Stand pivot to [facility] with CGA/supervision. Urinated and had very small BM prior to ambulating 120 ft in hallway with RW. Pt is easily distracted and requires constant vcs for safety. HR did elevate to 130s but quickly resolves with standing/seated rest. sao2 >88% throughout gait training but pt requested to wear O2 when in bed in her room. RN aware of pt's abilities and safety concerns. She was in bed with bed alarm set, call bell in reach, and bed rails in place at conclusion of session.    Follow Up Recommendations  SNF;Supervision/Assistance - 24 hour;Supervision for mobility/OOB;Other (comment) (benefit from 24 hr supervision due to behavior/safety concer)     Equipment Recommendations  Rolling walker with 5" wheels;3in1 (PT)    Recommendations for Other Services       Precautions / Restrictions Precautions Precautions: Fall Restrictions Weight Bearing Restrictions: No    Mobility  Bed Mobility Overal bed mobility: Modified Independent Bed Mobility: Supine to Sit;Sit to Supine     Supine to sit: Supervision;HOB elevated Sit to supine: Min assist   General bed mobility comments: Pt was able to exit L side of bed with increased time + supervision. she requested assistance afterOOB activity with returning to supine. min assist to progress  BLEs into bed.  Transfers Overall transfer level: Needs assistance Equipment used: Rolling walker (2 wheeled);None Transfers: Sit to/from Stand Sit to Stand: Min guard;Supervision         General transfer comment: pt impulsivity requires CGA for safety however pt demonstrated ability to STS with supervision from [facility] and EOB when motivated to perform I'ly.  Ambulation/Gait Ambulation/Gait assistance: Min guard;Supervision Gait Distance (Feet): 120 Feet Assistive device: Rolling walker (2 wheeled) Gait Pattern/deviations: Step-through pattern;Trunk flexed Gait velocity: impulsive/ slow and fast cadenece during gait training   General Gait Details: pt is easily distracted and stops in hallway several times during ambulation 120 ft. HR did elevate to 130s but once pt takes seated rest recovers quickly to low 100s.         Cognition Arousal/Alertness: Awake/alert Behavior During Therapy: Impulsive;Anxious;Agitated;Restless Overall Cognitive Status: Within Functional Limits for tasks assessed      General Comments: Pt is alert but disoriented. is able to follow command swith increased time              Pertinent Vitals/Pain Pain Assessment: 0-10 Pain Location: L hip Pain Descriptors / Indicators: Grimacing Pain Intervention(s): Monitored during session;Limited activity within patient's tolerance;Premedicated before session;Repositioned;Ice applied           PT Goals (current goals can now be found in the care plan section) Acute Rehab PT Goals Patient Stated Goal: none stated Progress towards PT goals: Progressing toward goals    Frequency    Min 2X/week      PT Plan Current plan remains appropriate       AM-PAC PT "6 Clicks" Mobility   Outcome Measure  Help  needed turning from your back to your side while in a flat bed without using bedrails?: None Help needed moving from lying on your back to sitting on the side of a flat bed without using bedrails?:  None Help needed moving to and from a bed to a chair (including a wheelchair)?: A Little Help needed standing up from a chair using your arms (e.g., wheelchair or bedside chair)?: A Little Help needed to walk in hospital room?: A Little Help needed climbing 3-5 steps with a railing? : A Little 6 Click Score: 20    End of Session Equipment Utilized During Treatment: Gait belt Activity Tolerance: Patient tolerated treatment well;Patient limited by fatigue Patient left: in bed;with call bell/phone within reach;with bed alarm set Nurse Communication: Mobility status PT Visit Diagnosis: Muscle weakness (generalized) (M62.81);Difficulty in walking, not elsewhere classified (R26.2)     Time: 1121-6244 PT Time Calculation (min) (ACUTE ONLY): 15 min  Charges:  $Gait Training: 8-22 mins                     Julaine Fusi PTA 11/14/19, 4:43 PM

## 2019-11-14 NOTE — Progress Notes (Signed)
PT Cancellation Note  Patient Details Name: Katelyn Lane MRN: [id number] DOB: September 09, 1955   Cancelled Treatment:     PT attempt, pt refused. Several attempts to encourage pt to participate however pt continues t refuse. PT will continue efforts to mobilize pt per POC. Author will attempt to return later this date if pt changes her mind. " I just can't do it today! Why can't you just leave me alone."   Willette Pa 11/14/2019, 2:26 PM

## 2019-11-15 ENCOUNTER — Inpatient Hospital Stay: Payer: Medicaid Other

## 2019-11-15 DIAGNOSIS — K652 Spontaneous bacterial peritonitis: Secondary | ICD-10-CM | POA: Diagnosis not present

## 2019-11-15 LAB — CBC WITH DIFFERENTIAL/PLATELET
Abs Immature Granulocytes: 0.07 10*3/uL (ref 0.00–0.07)
Basophils Absolute: 0.1 10*3/uL (ref 0.0–0.1)
Basophils Relative: 1 %
Eosinophils Absolute: 0.3 10*3/uL (ref 0.0–0.5)
Eosinophils Relative: 3 %
HCT: 23.7 % — ABNORMAL LOW (ref 36.0–46.0)
Hemoglobin: 7.5 g/dL — ABNORMAL LOW (ref 12.0–15.0)
Immature Granulocytes: 1 %
Lymphocytes Relative: 10 %
Lymphs Abs: 0.8 10*3/uL (ref 0.7–4.0)
MCH: 22.6 pg — ABNORMAL LOW (ref 26.0–34.0)
MCHC: 31.6 g/dL (ref 30.0–36.0)
MCV: 71.4 fL — ABNORMAL LOW (ref 80.0–100.0)
Monocytes Absolute: 1 10*3/uL (ref 0.1–1.0)
Monocytes Relative: 12 %
Neutro Abs: 5.8 10*3/uL (ref 1.7–7.7)
Neutrophils Relative %: 73 %
Platelets: 500 10*3/uL — ABNORMAL HIGH (ref 150–400)
RBC: 3.32 MIL/uL — ABNORMAL LOW (ref 3.87–5.11)
RDW: 19.2 % — ABNORMAL HIGH (ref 11.5–15.5)
WBC: 8.1 10*3/uL (ref 4.0–10.5)
nRBC: 0.9 % — ABNORMAL HIGH (ref 0.0–0.2)

## 2019-11-15 LAB — COMPREHENSIVE METABOLIC PANEL
ALT: 12 U/L (ref 0–44)
AST: 29 U/L (ref 15–41)
Albumin: 2.2 g/dL — ABNORMAL LOW (ref 3.5–5.0)
Alkaline Phosphatase: 299 U/L — ABNORMAL HIGH (ref 38–126)
Anion gap: 12 (ref 5–15)
BUN: 60 mg/dL — ABNORMAL HIGH (ref 8–23)
CO2: 23 mmol/L (ref 22–32)
Calcium: 9.1 mg/dL (ref 8.9–10.3)
Chloride: 94 mmol/L — ABNORMAL LOW (ref 98–111)
Creatinine, Ser: 3.73 mg/dL — ABNORMAL HIGH (ref 0.44–1.00)
GFR, Estimated: 13 mL/min — ABNORMAL LOW (ref >60)
Glucose, Bld: 87 mg/dL (ref 70–99)
Potassium: 4.9 mmol/L (ref 3.5–5.1)
Sodium: 129 mmol/L — ABNORMAL LOW (ref 135–145)
Total Bilirubin: 1.3 mg/dL — ABNORMAL HIGH (ref 0.3–1.2)
Total Protein: 6 g/dL — ABNORMAL LOW (ref 6.5–8.1)

## 2019-11-15 LAB — PHOSPHORUS: Phosphorus: 4 mg/dL (ref 2.5–4.6)

## 2019-11-15 LAB — MAGNESIUM: Magnesium: 1.7 mg/dL (ref 1.7–2.4)

## 2019-11-15 MED ORDER — ALBUMIN HUMAN 25 % IV SOLN
25.0000 g | Freq: Once | INTRAVENOUS | Status: AC
  Administered 2019-11-15: 25 g via INTRAVENOUS
  Filled 2019-11-15: qty 100

## 2019-11-15 MED ORDER — SODIUM CHLORIDE 0.9 % IV SOLN: INTRAVENOUS | Status: DC

## 2019-11-15 NOTE — Plan of Care (Signed)
  Problem: Activity: Goal: Risk for activity intolerance will decrease Outcome: Progressing   Problem: Nutrition: Goal: Adequate nutrition will be maintained Outcome: Progressing   Problem: Pain Managment: Goal: General experience of comfort will improve Outcome: Progressing   Problem: Safety: Goal: Ability to remain free from injury will improve Outcome: Progressing   

## 2019-11-15 NOTE — Progress Notes (Signed)
PROGRESS NOTE    Katelyn Lane  XBJ:[id number] DOB: Jun 11, 1955 DOA: 10/19/2019 PCP: [name], MD  Chief Complaint  Patient presents with  . 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 Found to have BNP greater than 4500, elevated troponin and AKI.  Multiple prior admissions with similar symptoms and complaints. Prolonged hospitalization in August 2021 when she was discharged to SNF. Not clear when she get out of SNF. Continues to complain that she is homeless but per social worker she do have [name]. Not take her medication despite being provided with medical management.  After admission to the hospital, she was diagnosed with acute on chronic combinedsystolicanddiastoliccongestiveheartfailurewith ejection fraction 25 to 30%. She was not responding to IV Lasix. She was seen by nephrology, temporary dialysis catheter was performed and she was dialyzed on 10/17 and 10/18.Patient also complaining of abdominal pain, she had [name] paracentesis, study showed possible spontaneous peritonitis, she was treated with Rocephin and Flagyl.  Patient had [name] worsening respiratory status on 10/17, was diagnosed with aspiration pneumonia. She was treated with Rocephin, Zithromax and Flagyl. Patient also had abdominal pain and [name] CT scan suspect ischemia, but seen by general surgery on 10/19, no need for surgery.  10/21.Repeat paracentesis performed, antibiotic changed to Rocephin only. Permacath placed.  10/22.Repeated paracentesis showed white cell count of 3048, 69% neutrophils. However, patient clinically improving. Continue Rocephin  10/26.Repeated paracentesis showed  white cell count of 1636.  10/27: The patient was unable to tolerate ultrafiltration due to hypotension.   11/09/2019: no acute need for dialysis now. Palliative care has been consulted and is talking with the patient.  11/10/2019: Pt with hypotension and Na of 128 this am. Morning dose of Lasix 60 mg IV was held. After discussion with nephrology, the lasix dose was reduced to 20 mg to limit need to hold doses of diuretic in the future.  Assessment & Plan:   Principal Problem:   Spontaneous bacterial peritonitis ([place]) Active Problems:   AKI (acute kidney injury) ([place])   Crack cocaine use   Acute respiratory failure with hypoxia (HCC)   LV (left ventricular) mural thrombus   Nonsustained ventricular tachycardia (HCC)   Acute CHF (congestive heart failure) (HCC)   Cocaine abuse ([place])   Peripheral edema   Cirrhosis of liver (HCC)   Dementia (HCC)   Aspiration pneumonia of both lower lobes due to gastric secretions (HCC)   Acute diastolic (congestive) heart failure ([place])   Cardiorenal disease   Goals of care, counseling/discussion   Palliative care by specialist  Goals of care: appreciate palliative care involvement.  Complex situation overall.  SNF with palliative recommended.  She's now DNR with no plans for dialysis.  GOC are often difficult due to her orientation and limited insight at times, but she's been relatively consistent in her overall goals (no dialysis or heroic measures, consistent with her DNR).  Limited insight at times makes discharge planning more complex.  She was seen earlier by psych on 10/21 who noted that "she is mostly unable to make decisions for herself safely about her overall treatment", may need to reengage with psych for clarification.  Spontaneous bacterial peritonitis: Repeat paracentesis showed significant decrease of white cell in ascites, antibiotics effective. Hoever she still has Katelyn Lane  lot of neutrophils in 11/03/2019 fluid. Culture has been negative.  Pt received many days of Rocephin, since discontinued. Monitor. --cipro for ppx   Acute on chronic combined systolic and diastolic congestive heart failure. Acute hypoxemic respiratory failure.       She was dialyzed x2, but now getting diuresed.  Pt reported does not want to get dialysis again. --hold lasix today with bump in creatinine -- repeat CXR with improved edema 11/7 --I've reconsulted renal for assistance with diuresis in setting of her worsening creatinine   Liver cirrhosis with ascites: Noted. GI consulted previously in hospitalization. MELD 28.   --cipro for SBP ppx  Acute renal failure on chronic kidney disease stage IIIb: baseline creatinine appears to be 1.66.  Due to cardiorenal syndrome. Creatinine peaked at 4.16, improved recently, though worsening to relatively stable in past few days.  Creatinined improved, but now worsening again.  No plan for further dialysis per renal.  Pt reportedly does not want dialysis again --hold lasix today, creatinine rising to 3.73, have reconsulted renal for assistance --nephrology contacted today about diuretic plan on discharge --removal of R permacath on 11/5  Hyponatremia: Mild. CTM  Left ventricular thrombus with history of CVA.Patient was initially concerned for possible GI bleed, anticoagulation was on hold. However, there is no GI bleed observed while in the hospital. GI was not concerned about it. It is not safe to continue hold anticoagulation. Anticoagulation has now been restarted with eliquis.  --Monitor hemoglobin. --cont Eliquis  Overgrown toenails: will need outpatient podiatry follow up  Anemia of chronic disease: Hemoglobin still stable. Patient had [name] normal B12 level in July as well as folate, but she also has mild iron deficiency.  --cont iron supplement  Hypokalemia:Resolved. Monitor.  DVT prophylaxis: eliquis Code Status: dnr Family Communication: declined me calling Disposition:   Status  is: Inpatient  Remains inpatient appropriate because:Inpatient level of care appropriate due to severity of illness   Dispo: The patient is from: Home              Anticipated d/c is to: pending              Anticipated d/c date is: > 3 days              Patient currently is not medically stable to d/c.  Consultants:   Palliative  Renal  GI  Procedures: 10/20 perma cath insertion perma cath removal 11/5  Antimicrobials:  Anti-infectives (From admission, onward)   Start     Dose/Rate Route Frequency Ordered Stop   11/14/19 0800  ciprofloxacin (CIPRO) tablet 500 mg        500 mg Oral Daily with breakfast 11/13/19 1938     11/01/19 1000  cefTRIAXone (ROCEPHIN) 2 g in sodium chloride 0.9 % 100 mL IVPB  Status:  Discontinued        2 g 200 mL/hr over 30 Minutes Intravenous Daily 10/31/19 1521 11/09/19 1425   10/27/19 1000  cefTRIAXone (ROCEPHIN) 2 g in sodium chloride 0.9 % 100 mL IVPB  Status:  Discontinued        2 g 200 mL/hr over 30 Minutes Intravenous Daily 10/26/19 1559 10/31/19 1521   10/26/19 2000  cefTRIAXone (ROCEPHIN) 1 g in sodium chloride 0.9 % 100 mL IVPB        1 g 200 mL/hr over 30 Minutes Intravenous  Once 10/26/19 1559 10/26/19 2024   10/26/19 1600  cefTRIAXone (ROCEPHIN) 2 g in sodium chloride 0.9 % 100 mL IVPB  Status:  Discontinued        2 g 200 mL/hr over 30 Minutes Intravenous Every 24 hours 10/26/19 1351 10/26/19 1600   10/26/19 1000  metroNIDAZOLE (FLAGYL) IVPB 500 mg  Status:  Discontinued        500 mg 100 mL/hr over 60 Minutes Intravenous Every 8 hours 10/26/19 0909 10/29/19 1138   10/25/19 1445  azithromycin (ZITHROMAX) 500 mg in sodium chloride 0.9 % 250 mL IVPB  Status:  Discontinued        500 mg 250 mL/hr over 60 Minutes Intravenous Every 24 hours 10/25/19 1353 10/29/19 1138   10/25/19 1230  azithromycin (ZITHROMAX) tablet 500 mg  Status:  Discontinued        500 mg Oral Daily 10/25/19 1133 10/25/19 1134   10/25/19 1030  cefTRIAXone  (ROCEPHIN) 1 g in sodium chloride 0.9 % 100 mL IVPB  Status:  Discontinued        1 g 200 mL/hr over 30 Minutes Intravenous Every 12 hours 10/25/19 0931 10/26/19 1351         Subjective: No new complaints  Objective: Vitals:   11/15/19 0322 11/15/19 0844 11/15/19 0943 11/15/19 1206  BP: 111/88 107/88 96/74 92/69   Pulse: 92 76 91 75  Resp: 18 20 16 20   Temp: (!) 97.5 F (36.4 C) 97.7 F (36.5 C) 97.8 F (36.6 C) 97.7 F (36.5 C)  TempSrc:  Oral Oral Oral  SpO2: 100% 91% 97% 100%  Weight:      Height:       No intake or output data in the 24 hours ending 11/15/19 1439 Filed Weights   11/13/19 0744 11/13/19 1226 11/14/19 0515  Weight: 101.9 kg 101.9 kg 104.1 kg    Examination:  General: No acute distress. Cardiovascular: Heart sounds show Darien Kading regular rate, and rhythm.  Lungs: Clear to auscultation bilaterally  Abdomen: Soft, nontender, nondistended  Neurological: Alert and oriented 3. Moves all extremities 4Cranial nerves II through XII grossly intact. Skin: Warm and dry. No rashes or lesions. Extremities: bilateral LE edema   Data Reviewed: I have personally reviewed following labs and imaging studies  CBC: Recent Labs  Lab 11/10/19 0409 11/10/19 0409 11/11/19 0433 11/12/19 0618 11/13/19 0436 11/14/19 0358 11/15/19 0527  WBC 6.2   < > 6.5 7.4 7.5 8.3 8.1  NEUTROABS 4.1  --  4.2 5.2  --  5.6 5.8  HGB 7.8*   < > 8.0* 8.0* 7.7* 8.3* 7.5*  HCT 24.8*   < > 24.2* 24.5* 24.7* 25.7* 23.7*  MCV 72.9*   < > 71.2* 71.2* 72.4* 71.2* 71.4*  PLT 508*   < > 491* 467* 463* 526* 500*   < > = values in this interval not displayed.    Basic Metabolic Panel: Recent Labs  Lab 11/11/19 0433 11/12/19 0618 11/13/19 0436 11/14/19 0358 11/15/19 0527  NA 129* 128* 128* 128* 129*  K 4.5 4.2 4.6 4.8 4.9  CL 93* 92* 92* 91* 94*  CO2 27 26 25 24 23   GLUCOSE 107* 112* 91 102* 87  BUN 45* 48* 51* 54* 60*  CREATININE 3.14* 3.12* 3.25* 3.44* 3.73*  CALCIUM 8.6* 8.5* 9.2 9.4  9.1  MG  --  1.7 1.8 1.7 1.7  PHOS  --  3.5  --  4.0 4.0    GFR: Estimated Creatinine Clearance: 18.8 mL/min (Tzipporah Nagorski) (by C-G formula based on SCr of 3.73 mg/dL (H)).  Liver Function Tests: Recent Labs  Lab 11/11/19 0433 11/12/19 0618 11/13/19 0436  11/14/19 0358 11/15/19 0527  AST 24 26 26 24 29   ALT 10 11 11 12 12   ALKPHOS 312* 306* 317* 328* 299*  BILITOT 0.8 0.8 1.0 1.1 1.3*  PROT 5.8* 5.8* 5.9* 6.7 6.0*  ALBUMIN 2.1* 2.2* 2.2* 2.4* 2.2*    CBG: Recent Labs  Lab 11/11/19 0024  GLUCAP 168*     No results found for this or any previous visit (from the past 240 hour(s)).       Radiology Studies: DG Chest Port 1 View  Result Date: 11/14/2019 CLINICAL DATA:  Shortness of breath, hypertension EXAM: PORTABLE CHEST 1 VIEW COMPARISON:  Portable exam 1307 hours compared to 10/24/2019 FINDINGS: Enlargement of cardiac silhouette with pulmonary vascular congestion. Mediastinal contours normal. BILATERAL pulmonary infiltrates, similar to prior exam, favor pulmonary edema. Subsegmental atelectasis RIGHT base. Additional opacities in the lower LEFT lung which could represent atelectasis or infiltrate. Probable small associated LEFT pleural effusion. No pneumothorax. IMPRESSION: Enlargement of cardiac silhouette with vascular congestion and probable pulmonary edema. RIGHT basilar atelectasis. Atelectasis versus consolidation LEFT lower lobe with probable small LEFT pleural effusion. Electronically Signed   By: Lavonia Dana M.D.   On: 11/14/2019 19:21        Scheduled Meds: . apixaban  5 mg Oral BID  . Chlorhexidine Gluconate Cloth  6 each Topical Daily  . ciprofloxacin  500 mg Oral Q breakfast  . feeding supplement (NEPRO CARB STEADY)  237 mL Oral Q24H  . iron polysaccharides  150 mg Oral Daily  . mouth rinse  15 mL Mouth Rinse BID  . melatonin  2.5 mg Oral QHS  . midodrine  10 mg Oral TID WC  . multivitamin  1 tablet Oral QHS  . pantoprazole  40 mg Oral BID  . sodium chloride  flush  3 mL Intravenous Q12H   Continuous Infusions: . sodium chloride Stopped (11/01/19 1131)     LOS: 27 days    Time spent: over 30 min    Fayrene Helper, MD [facility]   To contact the attending provider between 7A-7P or the covering provider during after hours 7P-7A, please log into the web site [email or web address] and access using universal [place] password for that web site. If you do not have the password, please call the hospital operator.  11/15/2019, 2:39 PM

## 2019-11-16 DIAGNOSIS — N1832 Chronic kidney disease, stage 3b: Secondary | ICD-10-CM

## 2019-11-16 DIAGNOSIS — K7031 Alcoholic cirrhosis of liver with ascites: Secondary | ICD-10-CM

## 2019-11-16 DIAGNOSIS — I509 Heart failure, unspecified: Secondary | ICD-10-CM

## 2019-11-16 DIAGNOSIS — Z515 Encounter for palliative care: Secondary | ICD-10-CM | POA: Diagnosis not present

## 2019-11-16 DIAGNOSIS — K652 Spontaneous bacterial peritonitis: Secondary | ICD-10-CM | POA: Diagnosis not present

## 2019-11-16 LAB — CBC WITH DIFFERENTIAL/PLATELET
Abs Immature Granulocytes: 0.05 10*3/uL (ref 0.00–0.07)
Basophils Absolute: 0.1 10*3/uL (ref 0.0–0.1)
Basophils Relative: 1 %
Eosinophils Absolute: 0.4 10*3/uL (ref 0.0–0.5)
Eosinophils Relative: 5 %
HCT: 24 % — ABNORMAL LOW (ref 36.0–46.0)
Hemoglobin: 7.7 g/dL — ABNORMAL LOW (ref 12.0–15.0)
Immature Granulocytes: 1 %
Lymphocytes Relative: 9 %
Lymphs Abs: 0.8 10*3/uL (ref 0.7–4.0)
MCH: 22.8 pg — ABNORMAL LOW (ref 26.0–34.0)
MCHC: 32.1 g/dL (ref 30.0–36.0)
MCV: 71 fL — ABNORMAL LOW (ref 80.0–100.0)
Monocytes Absolute: 1.1 10*3/uL — ABNORMAL HIGH (ref 0.1–1.0)
Monocytes Relative: 13 %
Neutro Abs: 6 10*3/uL (ref 1.7–7.7)
Neutrophils Relative %: 71 %
Platelets: 522 10*3/uL — ABNORMAL HIGH (ref 150–400)
RBC: 3.38 MIL/uL — ABNORMAL LOW (ref 3.87–5.11)
RDW: 19.1 % — ABNORMAL HIGH (ref 11.5–15.5)
WBC: 8.4 10*3/uL (ref 4.0–10.5)
nRBC: 0 % (ref 0.0–0.2)

## 2019-11-16 LAB — PROTIME-INR
INR: 3.2 — ABNORMAL HIGH (ref 0.8–1.2)
Prothrombin Time: 32 seconds — ABNORMAL HIGH (ref 11.4–15.2)

## 2019-11-16 LAB — COMPREHENSIVE METABOLIC PANEL
ALT: 10 U/L (ref 0–44)
AST: 26 U/L (ref 15–41)
Albumin: 2.3 g/dL — ABNORMAL LOW (ref 3.5–5.0)
Alkaline Phosphatase: 252 U/L — ABNORMAL HIGH (ref 38–126)
Anion gap: 11 (ref 5–15)
BUN: 61 mg/dL — ABNORMAL HIGH (ref 8–23)
CO2: 25 mmol/L (ref 22–32)
Calcium: 8.9 mg/dL (ref 8.9–10.3)
Chloride: 92 mmol/L — ABNORMAL LOW (ref 98–111)
Creatinine, Ser: 3.93 mg/dL — ABNORMAL HIGH (ref 0.44–1.00)
GFR, Estimated: 12 mL/min — ABNORMAL LOW (ref >60)
Glucose, Bld: 101 mg/dL — ABNORMAL HIGH (ref 70–99)
Potassium: 5.1 mmol/L (ref 3.5–5.1)
Sodium: 128 mmol/L — ABNORMAL LOW (ref 135–145)
Total Bilirubin: 1 mg/dL (ref 0.3–1.2)
Total Protein: 5.6 g/dL — ABNORMAL LOW (ref 6.5–8.1)

## 2019-11-16 LAB — PHOSPHORUS: Phosphorus: 3.9 mg/dL (ref 2.5–4.6)

## 2019-11-16 LAB — MAGNESIUM: Magnesium: 1.7 mg/dL (ref 1.7–2.4)

## 2019-11-16 MED ORDER — HYDROXYZINE HCL 50 MG PO TABS
50.0000 mg | ORAL_TABLET | Freq: Four times a day (QID) | ORAL | Status: DC | PRN
  Administered 2019-11-21 – 2020-01-17 (×6): 50 mg via ORAL
  Filled 2019-11-16 (×10): qty 1

## 2019-11-16 MED ORDER — DIPHENHYDRAMINE HCL 25 MG PO CAPS
50.0000 mg | ORAL_CAPSULE | Freq: Four times a day (QID) | ORAL | Status: AC | PRN
  Administered 2019-11-18: 50 mg via ORAL
  Filled 2019-11-16: qty 2

## 2019-11-16 MED ORDER — TORSEMIDE 20 MG PO TABS
40.0000 mg | ORAL_TABLET | Freq: Every day | ORAL | Status: DC
  Administered 2019-11-16 – 2019-12-17 (×28): 40 mg via ORAL
  Filled 2019-11-16 (×30): qty 2

## 2019-11-16 MED ORDER — TRAZODONE HCL 100 MG PO TABS
100.0000 mg | ORAL_TABLET | Freq: Every day | ORAL | Status: DC
  Administered 2019-11-17 – 2019-12-16 (×12): 100 mg via ORAL
  Filled 2019-11-16 (×22): qty 1

## 2019-11-16 MED ORDER — DIPHENHYDRAMINE HCL 50 MG/ML IJ SOLN
50.0000 mg | Freq: Four times a day (QID) | INTRAMUSCULAR | Status: AC | PRN
  Filled 2019-11-16: qty 1

## 2019-11-16 MED ORDER — HALOPERIDOL 5 MG PO TABS
5.0000 mg | ORAL_TABLET | ORAL | Status: AC
  Filled 2019-11-16: qty 1

## 2019-11-16 MED ORDER — HALOPERIDOL LACTATE 5 MG/ML IJ SOLN
5.0000 mg | INTRAMUSCULAR | Status: AC
  Filled 2019-11-16: qty 1

## 2019-11-16 NOTE — Progress Notes (Signed)
Patient ID: Katelyn Lane, female   DOB: 01-26-1955, 64 y.o.   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 Hospitalization complicated by need for dialysis and aspiration pneumonia. Today is day 70 of this hospital stay  Patient has declined initiation of dialysis for treatment of her worsening kidney function, creatinine continues to worsen today it is 3.93.  Disposition continues to be in question.  This NP visited patient at the bedside as a follow up for palliative medicine needs and emotional support.    Katelyn Lane is awake and alert and easily agitated.  She wants to leave and "get a cab".  I will defer further conversation to psychiatry   Psychiatry plans to follow-up with patient today, hopefully they can offer suggestions for next steps in transitions of care.     Discussed with Dr Florene Glen   Education regarding capacity vs competency offered.    Total time spent on the unit was 15 minutes and TOC team   Greater than 50% of the time was spent in counseling and coordination of care  Wadie Lessen NP  Palliative Medicine Team Team Phone # [phone] Pager [phone]

## 2019-11-16 NOTE — Progress Notes (Signed)
   11/16/19 1220  Clinical Encounter Type  Visited With Patient  Visit Type Follow-up;Spiritual support  Referral From Chaplain  Consult/Referral To Chaplain  While rounding, chaplain stopped by to see how Pt ws doing and to find out her plans for when she is discharged. When asked what she was going to do upon discharged, Pt made it known, she did not want to talk about that. Later in the conversation, Pt told chaplain, she was going to get a motel room for two days. She also talked about going to the bank first. Pt said she didn't know where her phone was, therefore she has to get a new one. She repeated but she has to go to the bank first. Pt talked about her nephew and how he has helped her. Pt said her pastor came by to see her yesterday and "what a time, what a time." Chaplain wished Pt well and will check on her tomorrow.

## 2019-11-16 NOTE — Progress Notes (Signed)
PROGRESS NOTE    Katelyn Lane  NUU:[id number] DOB: 05-Feb-1955 DOA: 10/19/2019 PCP: [name], MD  Chief Complaint  Patient presents with  . 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 Found to have BNP greater than 4500, elevated troponin and AKI.  Multiple prior admissions with similar symptoms and complaints. Prolonged hospitalization in August 2021 when she was discharged to SNF. Not clear when she get out of SNF. Continues to complain that she is homeless but per social worker she do have Katelyn Lane house. Not take her medication despite being provided with medical management.  After admission to the hospital, she was diagnosed with acute on chronic combinedsystolicanddiastoliccongestiveheartfailurewith ejection fraction 25 to 30%. She was not responding to IV Lasix. She was seen by nephrology, temporary dialysis catheter was performed and she was dialyzed on 10/17 and 10/18.Patient also complaining of abdominal pain, she had Katelyn Lane paracentesis, study showed possible spontaneous peritonitis, she was treated with Rocephin and Flagyl.  Patient had Katelyn Lane worsening respiratory status on 10/17, was diagnosed with aspiration pneumonia. She was treated with Rocephin, Zithromax and Flagyl. Patient also had abdominal pain and Devlon Dosher CT scan suspect ischemia, but seen by general surgery on 10/19, no need for surgery.  10/21.Repeat paracentesis performed, antibiotic changed to Rocephin only. Permacath placed.  10/22.Repeated paracentesis showed white cell count of 3048, 69% neutrophils. However, patient clinically improving. Continue Rocephin  10/26.Repeated paracentesis showed  white cell count of 1636.  10/27: The patient was unable to tolerate ultrafiltration due to hypotension.   11/09/2019: no acute need for dialysis now. Palliative care has been consulted and is talking with the patient.  11/10/2019: Pt with hypotension and Na of 128 this am. Morning dose of Lasix 60 mg IV was held. After discussion with nephrology, the lasix dose was reduced to 20 mg to limit need to hold doses of diuretic in the future.  Assessment & Plan:   Principal Problem:   Spontaneous bacterial peritonitis ([place]) Active Problems:   AKI (acute kidney injury) ([place])   Crack cocaine use   Acute respiratory failure with hypoxia ([facility])   LV (left ventricular) mural thrombus   Nonsustained ventricular tachycardia ([facility])   Acute CHF (congestive heart failure) ([facility])   Cocaine abuse ([place])   Peripheral edema   Cirrhosis of liver (H[facility])   Dementia (HCC)   Aspiration pneumonia of both lower lobes due to gastric secretions (H[facility])   Acute diastolic (congestive) heart failure ([place])   Cardiorenal disease   Goals of care, counseling/discussion   Palliative care by specialist  Goals of care: appreciate palliative care involvement.  Complex situation overall.  SNF with palliative recommended.  She's now DNR with no plans for dialysis.  GOC are often difficult due to her orientation and limited insight at times, but she's been relatively consistent in her overall goals (no dialysis or heroic measures, consistent with her DNR).  Limited insight at times makes discharge planning more complex.  She was seen earlier by psych on 10/21 who noted that "she is mostly unable to make decisions for herself safely about her overall treatment".  Psych c/s today and per 111/8 note, "patient still does have capacity to make decisions about her basic living situation".  Later in the day, she was trying  to leave against medical advice.  Worked together with psychiatry to deescalate, they'll follow to help with possible  discharge planning.     Spontaneous bacterial peritonitis: Repeat paracentesis showed significant decrease of white cell in ascites, antibiotics effective. Hoever she still has Katelyn Lane lot of neutrophils in 11/03/2019 fluid. Culture has been negative. Pt received many days of Rocephin, since discontinued. Monitor. --cipro for ppx   Acute on chronic combined systolic and diastolic congestive heart failure. Acute hypoxemic respiratory failure.       She was dialyzed x2, but now getting diuresed.  Pt reported does not want to get dialysis again. --hold lasix today with bump in creatinine - rising today -- repeat CXR with improved edema 11/7 --nephrology c/s, appreciate rcs - torsemide 40 mg daily  Liver cirrhosis with ascites: Noted. GI consulted previously in hospitalization. MELD 28.   --cipro for SBP ppx  Acute renal failure on chronic kidney disease stage IIIb: baseline creatinine appears to be 1.66.  Due to cardiorenal syndrome. Creatinine peaked at 4.16, improved recently, though worsening to relatively stable in past few days.  Creatinined improved, but now worsening again.  No plan for further dialysis per renal.  Pt reportedly does not want dialysis again --hold lasix today, creatinine rising to 3.93, have reconsulted renal for assistance --renal transitioned to torsemide 40 mg today --removal of R permacath on 11/5  Hyponatremia: Mild. CTM  Left ventricular thrombus with history of CVA.Patient was initially concerned for possible GI bleed, anticoagulation was on hold. However, there is no GI bleed observed while in the hospital. GI was not concerned about it. It is not safe to continue hold anticoagulation. Anticoagulation has now been restarted with eliquis.  --Monitor hemoglobin. --cont Eliquis  Overgrown toenails: will need outpatient podiatry follow up  Anemia of chronic disease: Hemoglobin still stable. Patient had Madie Cahn normal B12 level in July as well as folate, but  she also has mild iron deficiency.  --cont iron supplement  Hypokalemia:Resolved. Monitor.  DVT prophylaxis: eliquis Code Status: dnr Family Communication: declined me calling Disposition:   Status is: Inpatient  Remains inpatient appropriate because:Inpatient level of care appropriate due to severity of illness   Dispo: The patient is from: Home              Anticipated d/c is to: pending              Anticipated d/c date is: > 3 days              Patient currently is not medically stable to d/c.  Consultants:   Palliative  Renal  GI  Procedures: 10/20 perma cath insertion perma cath removal 11/5  Antimicrobials:  Anti-infectives (From admission, onward)   Start     Dose/Rate Route Frequency Ordered Stop   11/14/19 0800  ciprofloxacin (CIPRO) tablet 500 mg        500 mg Oral Daily with breakfast 11/13/19 1938     11/01/19 1000  cefTRIAXone (ROCEPHIN) 2 g in sodium chloride 0.9 % 100 mL IVPB  Status:  Discontinued        2 g 200 mL/hr over 30 Minutes Intravenous Daily 10/31/19 1521 11/09/19 1425   10/27/19 1000  cefTRIAXone (ROCEPHIN) 2 g in sodium chloride 0.9 % 100 mL IVPB  Status:  Discontinued        2 g 200 mL/hr over 30 Minutes Intravenous Daily 10/26/19 1559 10/31/19 1521   10/26/19 2000  cefTRIAXone (ROCEPHIN) 1 g in sodium chloride 0.9 %  100 mL IVPB        1 g 200 mL/hr over 30 Minutes Intravenous  Once 10/26/19 1559 10/26/19 2024   10/26/19 1600  cefTRIAXone (ROCEPHIN) 2 g in sodium chloride 0.9 % 100 mL IVPB  Status:  Discontinued        2 g 200 mL/hr over 30 Minutes Intravenous Every 24 hours 10/26/19 1351 10/26/19 1600   10/26/19 1000  metroNIDAZOLE (FLAGYL) IVPB 500 mg  Status:  Discontinued        500 mg 100 mL/hr over 60 Minutes Intravenous Every 8 hours 10/26/19 0909 10/29/19 1138   10/25/19 1445  azithromycin (ZITHROMAX) 500 mg in sodium chloride 0.9 % 250 mL IVPB  Status:  Discontinued        500 mg 250 mL/hr over 60 Minutes Intravenous  Every 24 hours 10/25/19 1353 10/29/19 1138   10/25/19 1230  azithromycin (ZITHROMAX) tablet 500 mg  Status:  Discontinued        500 mg Oral Daily 10/25/19 1133 10/25/19 1134   10/25/19 1030  cefTRIAXone (ROCEPHIN) 1 g in sodium chloride 0.9 % 100 mL IVPB  Status:  Discontinued        1 g 200 mL/hr over 30 Minutes Intravenous Every 12 hours 10/25/19 0931 10/26/19 1351         Subjective: Agitated, wants to go to the bank  Objective: Vitals:   11/16/19 0015 11/16/19 0432 11/16/19 0909 11/16/19 1213  BP: 92/78 102/75 94/65 105/83  Pulse: 71 73 73 75  Resp: 17 17 18 15   Temp: 98.7 F (37.1 C) 98.4 F (36.9 C) 98.3 F (36.8 C) 98.1 F (36.7 C)  TempSrc: Oral  Oral Oral  SpO2: 99% 100% 100% 100%  Weight:      Height:        Intake/Output Summary (Last 24 hours) at 11/16/2019 1938 Last data filed at 11/16/2019 1415 Gross per 24 hour  Intake 120 ml  Output --  Net 120 ml   Filed Weights   11/13/19 0744 11/13/19 1226 11/14/19 0515  Weight: 101.9 kg 101.9 kg 104.1 kg    Examination:  General: No acute distress. Cardiovascular: Heart sounds show Katelyn Lane regular rate, and rhythm Lungs: Clear to auscultation bilaterally Abdomen: Soft, nontender, nondistended Neurological: Alert and oriented 3. Moves all extremities 4. Cranial nerves II through XII grossly intact. Skin: Warm and dry. No rashes or lesions. Extremities: No clubbing or cyanosis. Bilateral LE edema.   Data Reviewed: I have personally reviewed following labs and imaging studies  CBC: Recent Labs  Lab 11/11/19 0433 11/11/19 0433 11/12/19 0618 11/13/19 0436 11/14/19 0358 11/15/19 0527 11/16/19 0822  WBC 6.5   < > 7.4 7.5 8.3 8.1 8.4  NEUTROABS 4.2  --  5.2  --  5.6 5.8 6.0  HGB 8.0*   < > 8.0* 7.7* 8.3* 7.5* 7.7*  HCT 24.2*   < > 24.5* 24.7* 25.7* 23.7* 24.0*  MCV 71.2*   < > 71.2* 72.4* 71.2* 71.4* 71.0*  PLT 491*   < > 467* 463* 526* 500* 522*   < > = values in this interval not displayed.    Basic  Metabolic Panel: Recent Labs  Lab 11/12/19 0618 11/13/19 0436 11/14/19 0358 11/15/19 0527 11/16/19 0822  NA 128* 128* 128* 129* 128*  K 4.2 4.6 4.8 4.9 5.1  CL 92* 92* 91* 94* 92*  CO2 26 25 24 23 25   GLUCOSE 112* 91 102* 87 101*  BUN 48* 51* 54* 60* 61*  CREATININE 3.12* 3.25* 3.44* 3.73* 3.93*  CALCIUM 8.5* 9.2 9.4 9.1 8.9  MG 1.7 1.8 1.7 1.7 1.7  PHOS 3.5  --  4.0 4.0 3.9    GFR: Estimated Creatinine Clearance: 17.9 mL/min (Rena Hunke) (by C-G formula based on SCr of 3.93 mg/dL (H)).  Liver Function Tests: Recent Labs  Lab 11/12/19 0618 11/13/19 0436 11/14/19 0358 11/15/19 0527 11/16/19 0822  AST 26 26 24 29 26   ALT 11 11 12 12 10   ALKPHOS 306* 317* 328* 299* 252*  BILITOT 0.8 1.0 1.1 1.3* 1.0  PROT 5.8* 5.9* 6.7 6.0* 5.6*  ALBUMIN 2.2* 2.2* 2.4* 2.2* 2.3*    CBG: Recent Labs  Lab 11/11/19 0024  GLUCAP 168*     No results found for this or any previous visit (from the past 240 hour(s)).       Radiology Studies: DG Chest Port 1 View  Result Date: 11/15/2019 CLINICAL DATA:  Shortness of breath, history CHF, COVID-19, hypertension, end-stage renal disease on dialysis EXAM: PORTABLE CHEST 1 VIEW COMPARISON:  Portable exam 1359 hours compared to 11/14/2019 FINDINGS: Enlargement of cardiac silhouette with pulmonary vascular congestion. Improved pulmonary edema. Persistent opacification in LEFT lower lobe. Subsegmental atelectasis RIGHT base. No pneumothorax. IMPRESSION: Improved pulmonary edema. Persistent subsegmental atelectasis RIGHT base and atelectasis versus consolidation in LEFT lower lobe. Electronically Signed   By: Lavonia Dana M.D.   On: 11/15/2019 16:52        Scheduled Meds: . apixaban  5 mg Oral BID  . Chlorhexidine Gluconate Cloth  6 each Topical Daily  . ciprofloxacin  500 mg Oral Q breakfast  . feeding supplement (NEPRO CARB STEADY)  237 mL Oral Q24H  . haloperidol  5 mg Oral STAT   Or  . haloperidol lactate  5 mg Intramuscular STAT  . iron  polysaccharides  150 mg Oral Daily  . mouth rinse  15 mL Mouth Rinse BID  . melatonin  2.5 mg Oral QHS  . midodrine  10 mg Oral TID WC  . multivitamin  1 tablet Oral QHS  . pantoprazole  40 mg Oral BID  . sodium chloride flush  3 mL Intravenous Q12H  . torsemide  40 mg Oral Daily  . traZODone  100 mg Oral QHS   Continuous Infusions: . sodium chloride Stopped (11/01/19 1131)     LOS: 28 days    Time spent: over 30 min    Fayrene Helper, MD [facility]   To contact the attending provider between 7A-7P or the covering provider during after hours 7P-7A, please log into the web site [email or web address] and access using universal [place] password for that web site. If you do not have the password, please call the hospital operator.  11/16/2019, 7:38 PM

## 2019-11-16 NOTE — Consult Note (Signed)
[facility] Face-to-Face Psychiatry Consult   Reason for Consult: Consult to follow-up with this 64 year old woman on the question of "capacity" Referring Physician: Florene Glen Patient Identification: Katelyn Lane MRN:  [id number] Principal Diagnosis: Spontaneous bacterial peritonitis ([place]) Diagnosis:  Principal Problem:   Spontaneous bacterial peritonitis ([place]) Active Problems:   AKI (acute kidney injury) ([place])   Crack cocaine use   Acute respiratory failure with hypoxia ([place])   LV (left ventricular) mural thrombus   Nonsustained ventricular tachycardia (HCC)   Acute CHF (congestive heart failure) (HCC)   Cocaine abuse ([place])   Peripheral edema   Cirrhosis of liver (HCC)   Dementia ([place])   Aspiration pneumonia of both lower lobes due to gastric secretions (HCC)   Acute diastolic (congestive) heart failure (HCC)   Cardiorenal disease   Goals of care, counseling/discussion   Palliative care by specialist   Total Time spent with patient: 1 hour  Subjective:   Katelyn Lane is a 64 y.o. female patient admitted with "it is driving me crazy to be here".  HPI: Patient seen chart reviewed.  Patient familiar to me from previous encounters.  64 year old woman with multiple medical problems who has been in the hospital for about a month now.  Disposition has been held not only by medical issues but by some uncertainty about discharge plan.  Patient recognized me when I introduced myself today.  She was familiar with the basic reason that I was coming to see her.  She was fully oriented knowing the place time and person.  She was also oriented to situation.  She was able to tell me that she was in the hospital for heart failure and the symptoms of fluid building up on her.  She was able to tell me that when fluid buildup they gave her medicines such as Lasix to make her urinate and that in the past that had dialysis.  Patient told me that she understands that without appropriate follow-up medical treatment  and possibly even with medical treatment she is at elevated risk of bad outcome including death.  She tells me that she has reconciled her self to that and does not feel afraid.  Patient reports that she wants to be discharged from the hospital as soon as possible.  She tells me that she will be staying with her brother for 1 or 2 days and then will find herself a motel.  This is slightly different than what she told the chaplain earlier when she said that she would go straight to the motel for a couple days.  Patient was able to come up with at least theoretically reasonable answers to questions about how she would manage taking care of herself and getting around to get food.  She tells me that she would agree to take outpatient medication.  Patient did not report any delusional content.  Denied feeling depressed.  Denied suicidal or homicidal ideation.  It was difficult to get a much more detailed cognitive assessment because she is hyperverbal and disorganized in her thinking tending to dominate the conversation and get irritable if you try to interrupt her or redirect the content.  Past Psychiatric History: Patient has been seen by psychiatry here only in the past several months.  History of possible mild dementia history of substance abuse.  No inpatient psychiatric admissions.  Risk to Self:   Risk to Others:   Prior Inpatient Therapy:   Prior Outpatient Therapy:    Past Medical History:  Past Medical History:  Diagnosis Date  .  CHF (congestive heart failure) ([place])   . COVID-19   . Hyperlipidemia   . Hypertension   . Renal disorder     Past Surgical History:  Procedure Laterality Date  . DIALYSIS/PERMA CATHETER INSERTION N/A 10/28/2019   Procedure: DIALYSIS/PERMA CATHETER INSERTION;  Surgeon: Katha Cabal, MD;  Location: [facility];  Service: Cardiovascular;  Laterality: N/A;  . DIALYSIS/PERMA CATHETER REMOVAL N/A 11/13/2019   Procedure: DIALYSIS/PERMA CATHETER REMOVAL;   Surgeon: Algernon Huxley, MD;  Location: [facility];  Service: Cardiovascular;  Laterality: N/A;  . RIGHT/LEFT HEART CATH AND CORONARY ANGIOGRAPHY N/A 12/30/2018   Procedure: RIGHT/LEFT HEART CATH AND CORONARY ANGIOGRAPHY;  Surgeon: Corey Skains, MD;  Location: [facility];  Service: Cardiovascular;  Laterality: N/A;   Family History:  Family History  Problem Relation Age of Onset  . Breast cancer Neg Hx    Family Psychiatric  History: Unknown Social History:  Social History   Substance and Sexual Activity  Alcohol Use No  . Alcohol/week: 0.0 standard drinks     Social History   Substance and Sexual Activity  Drug Use Yes  . Types: Cocaine   Comment: 3-4 days ago    Social History   Socioeconomic History  . Marital status: Single    Spouse name: Not on file  . Number of children: Not on file  . Years of education: Not on file  . Highest education level: Not on file  Occupational History  . Not on file  Tobacco Use  . Smoking status: Current Every Day Smoker  . Smokeless tobacco: Never Used  Substance and Sexual Activity  . Alcohol use: No    Alcohol/week: 0.0 standard drinks  . Drug use: Yes    Types: Cocaine    Comment: 3-4 days ago  . Sexual activity: Not on file  Other Topics Concern  . Not on file  Social History Narrative  . Not on file   Social Determinants of Health   Financial Resource Strain:   . Difficulty of Paying Living Expenses: Not on file  Food Insecurity:   . Worried About Charity fundraiser in the Last Year: Not on file  . Ran Out of Food in the Last Year: Not on file  Transportation Needs:   . Lack of Transportation (Medical): Not on file  . Lack of Transportation (Non-Medical): Not on file  Physical Activity:   . Days of Exercise per Week: Not on file  . Minutes of Exercise per Session: Not on file  Stress:   . Feeling of Stress : Not on file  Social Connections:   . Frequency of Communication with Friends and  Family: Not on file  . Frequency of Social Gatherings with Friends and Family: Not on file  . Attends Religious Services: Not on file  . Active Member of Clubs or Organizations: Not on file  . Attends Archivist Meetings: Not on file  . Marital Status: Not on file   Additional Social History:    Allergies:   Allergies  Allergen Reactions  . Penicillins Hives, Rash and Other (See Comments)    Did it involve swelling of the face/tongue/throat, SOB, or low BP? No Did it involve sudden or severe rash/hives, skin peeling, or any reaction on the inside of your mouth or nose? Yes Did you need to seek medical attention at a hospital or doctor's office? Yes When did it last happen? >25 years If all above answers  are "NO", may proceed with cephalosporin use.   . Ace Inhibitors Nausea And Vomiting    Labs:  Results for orders placed or performed during the hospital encounter of 10/19/19 (from the past 48 hour(s))  CBC with Differential/Platelet     Status: Abnormal   Collection Time: 11/15/19  5:27 AM  Result Value Ref Range   WBC 8.1 4.0 - 10.5 K/uL   RBC 3.32 (L) 3.87 - 5.11 MIL/uL   Hemoglobin 7.5 (L) 12.0 - 15.0 g/dL    Comment: Reticulocyte Hemoglobin testing may be clinically indicated, consider ordering this additional test [id number]    HCT 23.7 (L) 36 - 46 %   MCV 71.4 (L) 80.0 - 100.0 fL   MCH 22.6 (L) 26.0 - 34.0 pg   MCHC 31.6 30.0 - 36.0 g/dL   RDW 19.2 (H) 11.5 - 15.5 %   Platelets 500 (H) 150 - 400 K/uL   nRBC 0.9 (H) 0.0 - 0.2 %   Neutrophils Relative % 73 %   Neutro Abs 5.8 1.7 - 7.7 K/uL   Lymphocytes Relative 10 %   Lymphs Abs 0.8 0.7 - 4.0 K/uL   Monocytes Relative 12 %   Monocytes Absolute 1.0 0.1 - 1.0 K/uL   Eosinophils Relative 3 %   Eosinophils Absolute 0.3 0.0 - 0.5 K/uL   Basophils Relative 1 %   Basophils Absolute 0.1 0.0 - 0.1 K/uL   Immature Granulocytes 1 %   Abs Immature Granulocytes 0.07 0.00 - 0.07 K/uL    Comment: Performed  at [facility], [address] [place], [place] [address]  Comprehensive metabolic panel     Status: Abnormal   Collection Time: 11/15/19  5:27 AM  Result Value Ref Range   Sodium 129 (L) 135 - 145 mmol/L   Potassium 4.9 3.5 - 5.1 mmol/L   Chloride 94 (L) 98 - 111 mmol/L   CO2 23 22 - 32 mmol/L   Glucose, Bld 87 70 - 99 mg/dL    Comment: Glucose reference range applies only to samples taken after fasting for at least 8 hours.   BUN 60 (H) 8 - 23 mg/dL   Creatinine, Ser 3.73 (H) 0.44 - 1.00 mg/dL   Calcium 9.1 8.9 - 10.3 mg/dL   Total Protein 6.0 (L) 6.5 - 8.1 g/dL   Albumin 2.2 (L) 3.5 - 5.0 g/dL   AST 29 15 - 41 U/L   ALT 12 0 - 44 U/L   Alkaline Phosphatase 299 (H) 38 - 126 U/L   Total Bilirubin 1.3 (H) 0.3 - 1.2 mg/dL   GFR, Estimated 13 (L) >60 mL/min    Comment: (NOTE) Calculated using the CKD-EPI Creatinine Equation (2021)    Anion gap 12 5 - 15    Comment: Performed at [facility], [address] [place], [place] [address]  Magnesium     Status: None   Collection Time: 11/15/19  5:27 AM  Result Value Ref Range   Magnesium 1.7 1.7 - 2.4 mg/dL    Comment: Performed at [facility], [address] [place], [place] [address]  Phosphorus     Status: None   Collection Time: 11/15/19  5:27 AM  Result Value Ref Range   Phosphorus 4.0 2.5 - 4.6 mg/dL    Comment: Performed at [facility], [address] [place], [place] [address]  CBC with Differential/Platelet     Status: Abnormal   Collection Time: 11/16/19  8:22 AM  Result Value Ref Range  WBC 8.4 4.0 - 10.5 K/uL   RBC 3.38 (L) 3.87 - 5.11 MIL/uL   Hemoglobin 7.7 (L) 12.0 - 15.0 g/dL    Comment: Reticulocyte Hemoglobin testing may be clinically indicated, consider ordering this additional test [id number]    HCT 24.0 (L) 36 - 46 %   MCV 71.0 (L) 80.0 - 100.0 fL   MCH 22.8 (L) 26.0 - 34.0 pg   MCHC 32.1 30.0 - 36.0 g/dL   RDW 19.1 (H) 11.5 - 15.5 %   Platelets 522 (H) 150  - 400 K/uL   nRBC 0.0 0.0 - 0.2 %   Neutrophils Relative % 71 %   Neutro Abs 6.0 1.7 - 7.7 K/uL   Lymphocytes Relative 9 %   Lymphs Abs 0.8 0.7 - 4.0 K/uL   Monocytes Relative 13 %   Monocytes Absolute 1.1 (H) 0.1 - 1.0 K/uL   Eosinophils Relative 5 %   Eosinophils Absolute 0.4 0.0 - 0.5 K/uL   Basophils Relative 1 %   Basophils Absolute 0.1 0.0 - 0.1 K/uL   Immature Granulocytes 1 %   Abs Immature Granulocytes 0.05 0.00 - 0.07 K/uL    Comment: Performed at [facility], [address] [place], [place] [address]  Comprehensive metabolic panel     Status: Abnormal   Collection Time: 11/16/19  8:22 AM  Result Value Ref Range   Sodium 128 (L) 135 - 145 mmol/L   Potassium 5.1 3.5 - 5.1 mmol/L   Chloride 92 (L) 98 - 111 mmol/L   CO2 25 22 - 32 mmol/L   Glucose, Bld 101 (H) 70 - 99 mg/dL    Comment: Glucose reference range applies only to samples taken after fasting for at least 8 hours.   BUN 61 (H) 8 - 23 mg/dL   Creatinine, Ser 3.93 (H) 0.44 - 1.00 mg/dL   Calcium 8.9 8.9 - 10.3 mg/dL   Total Protein 5.6 (L) 6.5 - 8.1 g/dL   Albumin 2.3 (L) 3.5 - 5.0 g/dL   AST 26 15 - 41 U/L   ALT 10 0 - 44 U/L   Alkaline Phosphatase 252 (H) 38 - 126 U/L   Total Bilirubin 1.0 0.3 - 1.2 mg/dL   GFR, Estimated 12 (L) >60 mL/min    Comment: (NOTE) Calculated using the CKD-EPI Creatinine Equation (2021)    Anion gap 11 5 - 15    Comment: Performed at [facility], [address], [place], [place] [address]  Magnesium     Status: None   Collection Time: 11/16/19  8:22 AM  Result Value Ref Range   Magnesium 1.7 1.7 - 2.4 mg/dL    Comment: Performed at [facility], [address], [place], [place] [address]  Phosphorus     Status: None   Collection Time: 11/16/19  8:22 AM  Result Value Ref Range   Phosphorus 3.9 2.5 - 4.6 mg/dL    Comment: Performed at [facility] [address] [place], [place] [address]  Protime-INR     Status: Abnormal    Collection Time: 11/16/19  8:22 AM  Result Value Ref Range   Prothrombin Time 32.0 (H) 11.4 - 15.2 seconds   INR 3.2 (H) 0.8 - 1.2    Comment: (NOTE) INR goal varies based on device and disease states. Performed at [facility], [address] [place], [place] [address]     Current Facility-Administered Medications  Medication Dose Route Frequency Provider Last Rate Last Admin  .  0.9 %  sodium chloride infusion  250 mL Intravenous PRN Schnier, Dolores Lory, MD   Stopped at 11/01/19 1131  . acetaminophen (TYLENOL) tablet 500 mg  500 mg Oral Q6H PRN Elodia Florence., MD   500 mg at 11/16/19 0251  . albuterol (PROVENTIL) (2.5 MG/3ML) 0.083% nebulizer solution 2.5 mg  2.5 mg Nebulization Q4H PRN Sharen Hones, MD      . alum & mag hydroxide-simeth (MAALOX/MYLANTA) 200-200-20 MG/5ML suspension 30 mL  30 mL Oral Q4H PRN Schnier, Dolores Lory, MD   30 mL at 10/23/19 2205   And  . lidocaine (XYLOCAINE) 2 % viscous mouth solution 15 mL  15 mL Oral Q4H PRN Schnier, Dolores Lory, MD   15 mL at 10/23/19 2205  . apixaban (ELIQUIS) tablet 5 mg  5 mg Oral BID Sharen Hones, MD   5 mg at 11/16/19 0935  . bisacodyl (DULCOLAX) suppository 10 mg  10 mg Rectal Daily PRN Schnier, Dolores Lory, MD   10 mg at 10/24/19 0459  . calcium carbonate (TUMS - dosed in mg elemental calcium) chewable tablet 200 mg of elemental calcium  1 tablet Oral TID PRN Sharen Hones, MD   200 mg of elemental calcium at 11/01/19 0949  . Chlorhexidine Gluconate Cloth 2 % PADS 6 each  6 each Topical Daily Schnier, Dolores Lory, MD   6 each at 11/16/19 [phone]  . ciprofloxacin (CIPRO) tablet 500 mg  500 mg Oral Q breakfast Elodia Florence., MD   500 mg at 11/16/19 0934  . diphenhydrAMINE (BENADRYL) capsule 25 mg  25 mg Oral Q6H PRN Sharion Settler, NP   25 mg at 11/14/19 2047  . feeding supplement (NEPRO CARB STEADY) liquid 237 mL  237 mL Oral Q24H Sharen Hones, MD   237 mL at 11/13/19 1450  . Glycerin (Adult) 2.1 g suppository 1  suppository  1 suppository Rectal Daily PRN Schnier, Dolores Lory, MD      . iron polysaccharides (NIFEREX) capsule 150 mg  150 mg Oral Daily Sharen Hones, MD   150 mg at 11/16/19 0933  . loperamide (IMODIUM) capsule 2 mg  2 mg Oral PRN Sharen Hones, MD   2 mg at 11/09/19 0853  . MEDLINE mouth rinse  15 mL Mouth Rinse BID Schnier, Dolores Lory, MD   15 mL at 11/16/19 0944  . melatonin tablet 2.5 mg  2.5 mg Oral QHS Sharen Hones, MD   2.5 mg at 11/15/19 2257  . midodrine (PROAMATINE) tablet 10 mg  10 mg Oral TID WC Swayze, Ava, DO   10 mg at 11/16/19 1235  . multivitamin (RENA-VIT) tablet 1 tablet  1 tablet Oral QHS Swayze, Ava, DO   1 tablet at 11/14/19 2048  . ondansetron (ZOFRAN) injection 4 mg  4 mg Intravenous Q8H PRN Schnier, Dolores Lory, MD   4 mg at 10/24/19 1901  . ondansetron (ZOFRAN) injection 4 mg  4 mg Intravenous Q6H PRN Schnier, Dolores Lory, MD      . oxyCODONE-acetaminophen (PERCOCET/ROXICET) 5-325 MG per tablet 1 tablet  1 tablet Oral Q4H PRN Sharen Hones, MD   1 tablet at 11/14/19 1805  . pantoprazole (PROTONIX) EC tablet 40 mg  40 mg Oral BID Sharen Hones, MD   40 mg at 11/16/19 0935  . sodium chloride flush (NS) 0.9 % injection 3 mL  3 mL Intravenous Q12H Schnier, Dolores Lory, MD   3 mL at 11/16/19 0944  . sodium chloride flush (NS) 0.9 % injection 3  mL  3 mL Intravenous PRN Schnier, Dolores Lory, MD   3 mL at 10/28/19 1120  . torsemide (DEMADEX) tablet 40 mg  40 mg Oral Daily Elodia Florence., MD   40 mg at 11/16/19 1317    Musculoskeletal: Strength & Muscle Tone: within normal limits Gait & Station: unsteady Patient leans: N/A  Psychiatric Specialty Exam: Physical Exam Vitals and nursing note reviewed.  Constitutional:      Appearance: She is well-developed.  HENT:     Head: Normocephalic and atraumatic.  Eyes:     Conjunctiva/sclera: Conjunctivae normal.     Pupils: Pupils are equal, round, and reactive to light.  Cardiovascular:     Heart sounds: Normal heart sounds.   Pulmonary:     Effort: Pulmonary effort is normal.  Abdominal:     Palpations: Abdomen is soft.  Musculoskeletal:        General: Normal range of motion.     Cervical back: Normal range of motion.  Skin:    General: Skin is warm and dry.  Neurological:     General: No focal deficit present.     Mental Status: She is alert.  Psychiatric:        Attention and Perception: She is inattentive.        Mood and Affect: Affect is labile.        Speech: Speech is rapid and pressured.        Behavior: Behavior is agitated. Behavior is not aggressive.        Thought Content: Thought content is not paranoid. Thought content does not include homicidal or suicidal ideation.        Cognition and Memory: Memory is impaired.        Judgment: Judgment is impulsive.     Review of Systems  Constitutional: Negative.   HENT: Negative.   Eyes: Negative.   Respiratory: Negative.   Cardiovascular: Negative.   Gastrointestinal: Negative.   Musculoskeletal: Negative.   Skin: Negative.   Neurological: Negative.   Psychiatric/Behavioral: Negative.     Blood pressure 105/83, pulse 75, temperature 98.1 F (36.7 C), temperature source Oral, resp. rate 15, height 5\' 6"  (1.676 m), weight 104.1 kg, SpO2 100 %.Body mass index is 37.04 kg/m.  General Appearance: Casual  Eye Contact:  Fair  Speech:  Pressured  Volume:  Increased  Mood:  Irritable  Affect:  Labile  Thought Process:  Coherent and Disorganized  Orientation:  Full (Time, Place, and Person)  Thought Content:  Tangential  Suicidal Thoughts:  No  Homicidal Thoughts:  No  Memory:  Immediate;   Fair Recent;   Fair Remote;   Fair  Judgement:  Fair  Insight:  Fair  Psychomotor Activity:  Restlessness  Concentration:  Concentration: Poor  Recall:  [employer] of Knowledge:  Fair  Language:  Fair  Akathisia:  No  Handed:  Right  AIMS (if indicated):     Assets:  Desire for Improvement Resilience  ADL's:  Impaired  Cognition:  Impaired,   Mild  Sleep:        Treatment Plan Summary: Plan This is a 64 year old woman with multiple medical problems.  The question is regarding capacity for the large question of basic decisions about her living situation.  This is always a difficult area to assess because the idea of choosing a living situation is so all-inclusive and broad that it can be hard to narrow the question down.  In general there has to be a  pretty high bar for making this decision as the stakes are deciding whether to take away a person's basic autonomy and freedom.  Although this is a case of a patient who may very well be on the verge of making decisions that are not what most of [name] would feel where the safest nevertheless she is able to spontaneously articulate the risks she is taking namely of dying from her medical problems.  She is able to give a basic articulation of a living plan that is equally as good as what I have heard from many other patients we feel comfortable discharging.  In this situation I believe the patient still does have capacity to make decisions about her basic living situation.  There is no indication I see for any change to any medication at this time.  Please reconsult if needed  Disposition: No evidence of imminent risk to self or others at present.   Patient does not meet criteria for psychiatric inpatient admission. Supportive therapy provided about ongoing stressors.  Alethia Berthold, MD 11/16/2019 3:16 PM

## 2019-11-16 NOTE — Progress Notes (Signed)
PT Cancellation Note  Patient Details Name: Katelyn Lane MRN: [id number] DOB: [birth year]   Cancelled Treatment:    Reason Eval/Treat Not Completed: Patient initially agreed to work/ambulate with PT services.  Pt requested brief to be donned but would not allow CNA to assist.  CNA left room and this PT began to untangle phone cord and O2 tubing in preparation for session with pt then becoming extremely agitated with no warning.  Pt began to scream "get out, security!" and while this PT was preparing to leave, lowering the bed and turning bed alarm back on, the pt began throwing things onto the floor including her phone.  PT left the room with other staff coming in to assess the patient secondary to her screaming.  Will attempt to see pt at a future date/time as medically appropriate and as pt will allow.       Linus Salmons PT, DPT 11/16/19, 10:59 AM

## 2019-11-16 NOTE — Progress Notes (Signed)
[facility]  ROUNDING NOTE   Subjective:   Patient awake in bed, pleasant and comfortable. She reports she has a good appetite and ate all of her breakfast today. She denies any changes in bowel or bladder. Denies shortness of breath at the moment and is on 3L oxygen nasal cannula. She states that she experienced some thumping chest pain and difficulty breathing last night but states it resolved after she sat up in bed. Right IJ catheter was  removed on 11/5, and bandages are clean and intact.  Discussed with her that since her creatinine is going up, and she is having more swelling in her legs, we will be starting her on Torsemide. We will need to monitor her blood pressure closely as she is on midodrine for hypotension. Patient verbalized understanding and agreeing to plan.   Objective:  Vital signs in last 24 hours:  Temp:  [97.5 F (36.4 C)-98.7 F (37.1 C)] 98.3 F (36.8 C) (11/08 0909) Pulse Rate:  [71-93] 73 (11/08 0909) Resp:  [17-20] 18 (11/08 0909) BP: (92-112)/(65-82) 94/65 (11/08 0909) SpO2:  [97 %-100 %] 100 % (11/08 0909)  Weight change:  Filed Weights   11/13/19 0744 11/13/19 1226 11/14/19 0515  Weight: 101.9 kg 101.9 kg 104.1 kg    Intake/Output: No intake/output data recorded.   Intake/Output this shift:  Total I/O In: 120 [P.O.:120] Out: -   Physical Exam: General:  Sitting up in bed, awake and alert, pleasant  Head  Normocephalic, Atraumatic  Lungs:  Lungs diminished at the bases, Normal effort  Heart:  Regular rate and rhythm  Abdomen:   Non distended, non-tender to palpation  Extremities:  2+ tight, pitting edema bilateral lower extremitites  Neurologic:  Oriented x 3  Skin: No acute lesions or rashes  No HD access (previous right IJ catheter was removed on 28/4)  Basic Metabolic Panel: Recent Labs  Lab 11/12/19 0618 11/12/19 0618 11/13/19 0436 11/13/19 0436 11/14/19 0358 11/15/19 0527 11/16/19 0822  NA 128*  --  128*  --   128* 129* 128*  K 4.2  --  4.6  --  4.8 4.9 5.1  CL 92*  --  92*  --  91* 94* 92*  CO2 26  --  25  --  24 23 25   GLUCOSE 112*  --  91  --  102* 87 101*  BUN 48*  --  51*  --  54* 60* 61*  CREATININE 3.12*  --  3.25*  --  3.44* 3.73* 3.93*  CALCIUM 8.5*   < > 9.2   < > 9.4 9.1 8.9  MG 1.7  --  1.8  --  1.7 1.7 1.7  PHOS 3.5  --   --   --  4.0 4.0 3.9   < > = values in this interval not displayed.    Liver Function Tests: Recent Labs  Lab 11/12/19 0618 11/13/19 0436 11/14/19 0358 11/15/19 0527 11/16/19 0822  AST 26 26 24 29 26   ALT 11 11 12 12 10   ALKPHOS 306* 317* 328* 299* 252*  BILITOT 0.8 1.0 1.1 1.3* 1.0  PROT 5.8* 5.9* 6.7 6.0* 5.6*  ALBUMIN 2.2* 2.2* 2.4* 2.2* 2.3*   No results for input(s): LIPASE, AMYLASE in the last 168 hours. Recent Labs  Lab 11/11/19 1030  AMMONIA 16    CBC: Recent Labs  Lab 11/11/19 0433 11/11/19 0433 11/12/19 0618 11/13/19 0436 11/14/19 0358 11/15/19 0527 11/16/19 0822  WBC 6.5   < > 7.4  7.5 8.3 8.1 8.4  NEUTROABS 4.2  --  5.2  --  5.6 5.8 6.0  HGB 8.0*   < > 8.0* 7.7* 8.3* 7.5* 7.7*  HCT 24.2*   < > 24.5* 24.7* 25.7* 23.7* 24.0*  MCV 71.2*   < > 71.2* 72.4* 71.2* 71.4* 71.0*  PLT 491*   < > 467* 463* 526* 500* 522*   < > = values in this interval not displayed.    Cardiac Enzymes: No results for input(s): CKTOTAL, CKMB, CKMBINDEX, TROPONINI in the last 168 hours.  BNP: Invalid input(s): POCBNP  CBG: Recent Labs  Lab 11/11/19 0024  GLUCAP 168*    Microbiology: Results for orders placed or performed during the hospital encounter of 10/19/19  Respiratory Panel by RT PCR (Flu A&B, Covid) - Nasopharyngeal Swab     Status: None   Collection Time: 10/19/19 11:13 PM   Specimen: Nasopharyngeal Swab  Result Value Ref Range Status   SARS Coronavirus 2 by RT PCR NEGATIVE NEGATIVE Final    Comment: (NOTE) SARS-CoV-2 target nucleic acids are NOT DETECTED.  The SARS-CoV-2 RNA is generally detectable in upper  respiratoy specimens during the acute phase of infection. The lowest concentration of SARS-CoV-2 viral copies this assay can detect is 131 copies/mL. A negative result does not preclude SARS-Cov-2 infection and should not be used as the sole basis for treatment or other patient management decisions. A negative result may occur with  improper specimen collection/handling, submission of specimen other than nasopharyngeal swab, presence of viral mutation(s) within the areas targeted by this assay, and inadequate number of viral copies (<131 copies/mL). A negative result must be combined with clinical observations, patient history, and epidemiological information. The expected result is Negative.  Fact Sheet for Patients:  PinkCheek.be  Fact Sheet for Healthcare Providers:  GravelBags.it  This test is no t yet approved or cleared by the [place] FDA and  has been authorized for detection and/or diagnosis of SARS-CoV-2 by FDA under an Emergency Use Authorization (EUA). This EUA will remain  in effect (meaning this test can be used) for the duration of the COVID-19 declaration under Section 564(b)(1) of the Act, 21 U.S.C. section 360bbb-3(b)(1), unless the authorization is terminated or revoked sooner.     Influenza A by PCR NEGATIVE NEGATIVE Final   Influenza B by PCR NEGATIVE NEGATIVE Final    Comment: (NOTE) The Xpert Xpress SARS-CoV-2/FLU/RSV assay is intended as an aid in  the diagnosis of influenza from Nasopharyngeal swab specimens and  should not be used as a sole basis for treatment. Nasal washings and  aspirates are unacceptable for Xpert Xpress SARS-CoV-2/FLU/RSV  testing.  Fact Sheet for Patients: PinkCheek.be  Fact Sheet for Healthcare Providers: GravelBags.it  This test is not yet approved or cleared by the [place] FDA and  has been  authorized for detection and/or diagnosis of SARS-CoV-2 by  FDA under an Emergency Use Authorization (EUA). This EUA will remain  in effect (meaning this test can be used) for the duration of the  Covid-19 declaration under Section 564(b)(1) of the Act, 21  U.S.C. section 360bbb-3(b)(1), unless the authorization is  terminated or revoked. Performed at [facility], [address] [place], [place] [address]   MRSA PCR Screening     Status: None   Collection Time: 10/25/19  2:50 PM   Specimen: Nasopharyngeal  Result Value Ref Range Status   MRSA by PCR NEGATIVE NEGATIVE Final    Comment:  The GeneXpert MRSA Assay (FDA approved for NASAL specimens only), is one component of a comprehensive MRSA colonization surveillance program. It is not intended to diagnose MRSA infection nor to guide or monitor treatment for MRSA infections. Performed at [facility], [address] [place], [place] [address]   CULTURE, BLOOD (ROUTINE X 2) w Reflex to ID Panel     Status: None   Collection Time: 10/25/19  4:29 PM   Specimen: BLOOD  Result Value Ref Range Status   Specimen Description BLOOD CENTRAL LINE  Final   Special Requests   Final    BOTTLES DRAWN AEROBIC AND ANAEROBIC Blood Culture adequate volume   Culture   Final    NO GROWTH 5 DAYS Performed at [facility], [address] [place], [place] [address]    Report Status 10/30/2019 FINAL  Final  CULTURE, BLOOD (ROUTINE X 2) w Reflex to ID Panel     Status: None   Collection Time: 10/26/19  6:45 AM   Specimen: BLOOD  Result Value Ref Range Status   Specimen Description BLOOD BLOOD LEFT HAND  Final   Special Requests   Final    BOTTLES DRAWN AEROBIC AND ANAEROBIC Blood Culture adequate volume   Culture   Final    NO GROWTH 5 DAYS Performed at [facility], [address] [place], [place] [address]    Report Status 10/31/2019 FINAL  Final  Body fluid culture     Status: None    Collection Time: 10/26/19 11:30 AM   Specimen: Fluid  Result Value Ref Range Status   Specimen Description   Final    FLUID Performed at [facility], [address], [place], [place] [address]    Special Requests   Final    PERITONEAL Performed at [facility], [address] [place], [place] [address]    Gram Stain   Final    ABUNDANT WBC PRESENT, PREDOMINANTLY PMN NO ORGANISMS SEEN    Culture   Final    NO GROWTH Performed at [facility], [place] [address] [place], [place] [address]    Report Status 10/30/2019 FINAL  Final  Body fluid culture     Status: None   Collection Time: 10/29/19  1:39 PM   Specimen: PATH Cytology Peritoneal fluid  Result Value Ref Range Status   Specimen Description   Final    PERITONEAL Performed at [facility], [address], [place], [place] [address]    Special Requests   Final    NONE Performed at [facility], [address] [place], [place] [address]    Gram Stain   Final    MODERATE WBC PRESENT, PREDOMINANTLY PMN NO ORGANISMS SEEN    Culture   Final    NO GROWTH 3 DAYS Performed at [facility], [place] [address] [place],  [address]    Report Status 11/02/2019 FINAL  Final  Gram stain     Status: None   Collection Time: 11/02/19  3:30 PM   Specimen: PATH Cytology Peritoneal fluid  Result Value Ref Range Status   Specimen Description PERITONEAL  Final   Special Requests NONE  Final   Gram Stain   Final    WBC SEEN RED BLOOD CELLS NO ORGANISMS SEEN Performed at [facility], [address] [place],  [address]    Report Status 11/02/2019 FINAL  Final    Coagulation Studies: Recent Labs    11/14/19  0358 11/16/19 0822  LABPROT 25.5* 32.0*  INR 2.4* 3.2*    Urinalysis: No results for input(s): COLORURINE, LABSPEC, PHURINE, GLUCOSEU, HGBUR, BILIRUBINUR, KETONESUR, PROTEINUR, UROBILINOGEN, NITRITE, LEUKOCYTESUR in the last 72 hours.  Invalid  input(s): APPERANCEUR    Imaging: DG Chest Port 1 View  Result Date: 11/15/2019 CLINICAL DATA:  Shortness of breath, history CHF, COVID-19, hypertension, end-stage renal disease on dialysis EXAM: PORTABLE CHEST 1 VIEW COMPARISON:  Portable exam 1359 hours compared to 11/14/2019 FINDINGS: Enlargement of cardiac silhouette with pulmonary vascular congestion. Improved pulmonary edema. Persistent opacification in LEFT lower lobe. Subsegmental atelectasis RIGHT base. No pneumothorax. IMPRESSION: Improved pulmonary edema. Persistent subsegmental atelectasis RIGHT base and atelectasis versus consolidation in LEFT lower lobe. Electronically Signed   By: Lavonia Dana M.D.   On: 11/15/2019 16:52   DG Chest Port 1 View  Result Date: 11/14/2019 CLINICAL DATA:  Shortness of breath, hypertension EXAM: PORTABLE CHEST 1 VIEW COMPARISON:  Portable exam 1307 hours compared to 10/24/2019 FINDINGS: Enlargement of cardiac silhouette with pulmonary vascular congestion. Mediastinal contours normal. BILATERAL pulmonary infiltrates, similar to prior exam, favor pulmonary edema. Subsegmental atelectasis RIGHT base. Additional opacities in the lower LEFT lung which could represent atelectasis or infiltrate. Probable small associated LEFT pleural effusion. No pneumothorax. IMPRESSION: Enlargement of cardiac silhouette with vascular congestion and probable pulmonary edema. RIGHT basilar atelectasis. Atelectasis versus consolidation LEFT lower lobe with probable small LEFT pleural effusion. Electronically Signed   By: Lavonia Dana M.D.   On: 11/14/2019 19:21     Medications:   . sodium chloride Stopped (11/01/19 1131)   . apixaban  5 mg Oral BID  . Chlorhexidine Gluconate Cloth  6 each Topical Daily  . ciprofloxacin  500 mg Oral Q breakfast  . feeding supplement (NEPRO CARB STEADY)  237 mL Oral Q24H  . iron polysaccharides  150 mg Oral Daily  . mouth rinse  15 mL Mouth Rinse BID  . melatonin  2.5 mg Oral QHS  . midodrine   10 mg Oral TID WC  . multivitamin  1 tablet Oral QHS  . pantoprazole  40 mg Oral BID  . sodium chloride flush  3 mL Intravenous Q12H  . torsemide  40 mg Oral Daily   sodium chloride, acetaminophen, albuterol, alum & mag hydroxide-simeth **AND** lidocaine, bisacodyl, calcium carbonate, diphenhydrAMINE, Glycerin (Adult), loperamide, ondansetron (ZOFRAN) IV, ondansetron (ZOFRAN) IV, oxyCODONE-acetaminophen, sodium chloride flush  Assessment/ Plan:  [name] is a 64 y.o. black female with congestive heart failure, hypertension, hyperlipidemia, gout, COPD, anemia, cocaine abuse who is admitted to [facility] on 10/19/2019 for Peripheral edema [R60.9] Acute CHF (congestive heart failure) ([place]) [I50.9] AKI (acute kidney injury) ([place]) [N17.9] Congestive heart failure, unspecified HF chronicity, unspecified heart failure type (HCC) [F63.8] Acute diastolic (congestive) heart failure ([place]) [I50.31]  1. CKD st 5 Re-evaluated for worsening in lower extremity edema, shortness of breath and elevation in serum creatinine.   Progressive decline in renal function throughout admission. Patient may have established a new baseline  Chronic kidney disease secondary to hypertension  Acute renal failure secondary to acute cardiorenal syndrome. S Creatinine trends are worsening. Right IJ permcath removed on 11/5 due to patient opting for paliiative measures after extensive discussion regarding the management of her worsening renal function.  - Lasix was held due to bump in creatinine - Will initiate Torsemide 40 mg QAM today to decrease swelling in legs and aid in shortness of breath.  Lab Results  Component Value Date   CREATININE 3.93 (H) 11/16/2019  CREATININE 3.73 (H) 11/15/2019   CREATININE 3.44 (H) 11/14/2019    2. Hypotension with acute exacerbation of systolic and diastolic congestive heart failure. Echocardiogram 06/22/19 EF of 25-30%. Most recent BP: 94/65 Patient's blood pressure readings stays  soft despite Midodrine 10 mg TID. - Monitor blood pressure closely while on Torsemide   3. Anemia of Chronic Disease Hemoglobin 7.7. Remians low but stable.  Continue to monitor and continue iron supplementation     LOS: [address] 11/8/202111:34 AM

## 2019-11-16 NOTE — Consult Note (Signed)
°  Follow-up on this patient who was seen earlier today for capacity.  Shortly after 4:00 the patient became emotionally agitated and began insisting that she was going to leave.  She would not cooperate with discussing the specifics of a discharge plan but became increasingly agitated and overwhelmed with her emotions.  Came to see the patient trying to work with other staff to de-escalate her.  Eventually the patient did cooperate passively with being moved back to her hospital bedroom but remains emotionally agitated and unwilling to be cooperative with appropriate discharge planning.  This was a bit of a change from what I had seen earlier today.  The different factor was the intensity of her emotion and her unwillingness to engage in back-and-forth conversation.  IVC papers have not been filed but I have recommended that she have a sitter overnight.  I still feel that ultimately it will be necessary and acceptable for her to leave the hospital to her own care but the way she was doing it tonight was clearly unsafe and she was not willing to engage in rational conversation.  We will see if tomorrow during the daytime she would be more open to having this conversation to facilitate discharge planning.

## 2019-11-17 DIAGNOSIS — K652 Spontaneous bacterial peritonitis: Secondary | ICD-10-CM | POA: Diagnosis not present

## 2019-11-17 DIAGNOSIS — I509 Heart failure, unspecified: Secondary | ICD-10-CM | POA: Diagnosis not present

## 2019-11-17 DIAGNOSIS — K7031 Alcoholic cirrhosis of liver with ascites: Secondary | ICD-10-CM | POA: Insufficient documentation

## 2019-11-17 DIAGNOSIS — N1832 Chronic kidney disease, stage 3b: Secondary | ICD-10-CM | POA: Diagnosis not present

## 2019-11-17 DIAGNOSIS — Z515 Encounter for palliative care: Secondary | ICD-10-CM | POA: Diagnosis not present

## 2019-11-17 DIAGNOSIS — F1490 Cocaine use, unspecified, uncomplicated: Secondary | ICD-10-CM | POA: Diagnosis not present

## 2019-11-17 NOTE — Progress Notes (Signed)
PROGRESS NOTE    Katelyn Lane  GUR:[id number] DOB: Aug 17, 1955 DOA: 10/19/2019 PCP: [name], MD  Chief Complaint  Patient presents with  . 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 Found to have BNP greater than 4500, elevated troponin and AKI.  Multiple prior admissions with similar symptoms and complaints. Prolonged hospitalization in August 2021 when she was discharged to SNF. Not clear when she get out of SNF. Continues to complain that she is homeless but per Education officer, museum she does have Katelyn Lane house. Not take her medication despite being provided with medical management.  After admission to the hospital, she was diagnosed with acute on chronic combinedsystolic and diastolic congestive heart failurewith ejection fraction 25 to 30%. She was not responding to IV Lasix. She was seen by nephrology, temporary dialysis catheter was performed and she was dialyzed on 10/17 and 10/18.Patient also complaining of abdominal pain, she had [place] paracentesis, study showed possible spontaneous peritonitis, she was treated with Rocephin and Flagyl.  Patient had Katelyn Lane worsening respiratory status on 10/17, was diagnosed with aspiration pneumonia. She was treated with Rocephin, Zithromax and Flagyl. Patient also had abdominal pain and Katelyn Lane CT scan suspect ischemia, but seen by general surgery on 10/19, no need for surgery.  10/21.Repeat paracentesis performed, antibiotic changed to Rocephin only. Permacath placed.  10/22.Repeated paracentesis showed white cell count of 3048, 69% neutrophils. However, patient clinically improving. Continue Rocephin  10/26.Repeated paracentesis  showed white cell count of 1636.  10/27: The patient was unable to tolerate ultrafiltration due to hypotension.   11/09/2019: no acute need for dialysis now. Palliative care has been consulted and is talking with the patient.  She's had Malayah Demuro prolonged and complicated hospitalization.  She's no longer interested in dialysis.  Palliative care is following for assistance with discharge planning.  Currently her limited insight is interfering with plans for discharge.  Appreciate psychiatry recommendation who at this point does not think she has the capacity for safe decisions about living situation.    Assessment & Plan:   Principal Problem:   Spontaneous bacterial peritonitis ([place]) Active Problems:   AKI (acute kidney injury) ([place])   Crack cocaine use   Acute respiratory failure with hypoxia (HCC)   LV (left ventricular) mural thrombus   Chronic kidney disease (CKD) stage G3b/A1, moderately decreased glomerular filtration rate (GFR) between 30-44 mL/min/1.73 square meter and albuminuria creatinine ratio less than 30 mg/g (HCC)   Nonsustained ventricular tachycardia (HCC)   Acute CHF (congestive heart failure) (HCC)   Cocaine abuse ([place])   Peripheral edema   Cirrhosis of liver (HCC)   Dementia (HCC)   Aspiration pneumonia of both lower lobes due to gastric secretions (HCC)   Acute diastolic (congestive) heart failure ([place])   Cardiorenal disease   Goals of care, counseling/discussion   Palliative care by specialist   Ascites due to alcoholic cirrhosis ([place])  Goals of care: appreciate palliative care involvement.  Complex situation overall.  SNF with palliative recommended.  She's now DNR with no plans for dialysis.  GOC are often difficult due to her orientation and limited insight at times, but she's been relatively consistent in her overall goals (no dialysis or heroic measures, consistent with her DNR).  Limited insight at times makes discharge planning  more complex.  She was seen  earlier by psych on 10/21 who noted that "she is mostly unable to make decisions for herself safely about her overall treatment".  Per 11/8 note, "patient still does have capacity to make decisions about her basic living situation".  On 11/8, she attempted to leave against medical advice.  No safe DC plan was identified at that time.  Discussed with psych, we deescalated prolonged situation and she was willing to stay.  Per Dr. Weber Cooks noted from 11/9, she does not have capacity for safe decisions about living situation.  Overall Katelyn Lane difficult situation, DSS working with [facility] team as well.  Spontaneous bacterial peritonitis: Repeat paracentesis showed significant decrease of white cell in ascites, antibiotics effective. Hoever she still has Katelyn Lane lot of neutrophils in 11/03/2019 fluid. Culture has been negative. Pt received many days of Rocephin, since discontinued. Monitor. --cipro for ppx   Acute on chronic combined systolic and diastolic congestive heart failure. Acute hypoxemic respiratory failure.       She was dialyzed x2, but now getting diuresed.  Pt reported does not want to get dialysis again. -- repeat CXR with improved edema 11/7 --nephrology c/s, appreciate recs (see 11/8 note) - torsemide 40 mg daily  Liver cirrhosis with ascites: Noted. GI consulted previously in hospitalization (see prior notes).  --cipro for SBP ppx  Hypotension: continue midorine  Acute renal failure on chronic kidney disease stage IIIb: baseline creatinine appears to be 1.66.  Due to cardiorenal syndrome. Creatinine peaked at 4.16, improved recently, though worsening to relatively stable in past few days.  Creatinined improved, but now worsening again.  No plan for further dialysis per renal.  Pt reportedly does not want dialysis again --hold lasix today, creatinine rising to 3.93 --renal transitioned to torsemide 40 mg today --removal of R permacath on 11/5  Hyponatremia: Mild. CTM  Left ventricular  thrombus with history of CVA.Patient was initially concerned for possible GI bleed, anticoagulation was on hold. However, there is no GI bleed observed while in the hospital. GI was not concerned about it. It is not safe to continue hold anticoagulation. Anticoagulation has now been restarted with eliquis.  --Monitor hemoglobin. --cont Eliquis  Overgrown toenails: will need outpatient podiatry follow up  Anemia of chronic disease: Hemoglobin still stable. Patient had Katelyn Lane normal B12 level in July as well as folate, but she also has mild iron deficiency.  --cont iron supplement  Hypokalemia:Resolved. Monitor.  DVT prophylaxis: eliquis Code Status: dnr Family Communication: declined me calling Disposition:   Status is: Inpatient  Remains inpatient appropriate because:Inpatient level of care appropriate due to severity of illness   Dispo: The patient is from: Home              Anticipated d/c is to: pending              Anticipated d/c date is: > 3 days              Patient currently is not medically stable to d/c.  Consultants:   Palliative  Renal  GI  Procedures: 10/20 perma cath insertion perma cath removal 11/5  Antimicrobials:  Anti-infectives (From admission, onward)   Start     Dose/Rate Route Frequency Ordered Stop   11/14/19 0800  ciprofloxacin (CIPRO) tablet 500 mg        500 mg Oral Daily with breakfast 11/13/19 1938     11/01/19 1000  cefTRIAXone (ROCEPHIN) 2 g in sodium chloride 0.9 % 100 mL IVPB  Status:  Discontinued        2 g 200 mL/hr over 30 Minutes Intravenous Daily 10/31/19 1521 11/09/19 1425   10/27/19 1000  cefTRIAXone (ROCEPHIN) 2 g in sodium chloride 0.9 % 100 mL IVPB  Status:  Discontinued        2 g 200 mL/hr over 30 Minutes Intravenous Daily 10/26/19 1559 10/31/19 1521   10/26/19 2000  cefTRIAXone (ROCEPHIN) 1 g in sodium chloride 0.9 % 100 mL IVPB        1 g 200 mL/hr over 30 Minutes Intravenous  Once 10/26/19 1559 10/26/19 2024    10/26/19 1600  cefTRIAXone (ROCEPHIN) 2 g in sodium chloride 0.9 % 100 mL IVPB  Status:  Discontinued        2 g 200 mL/hr over 30 Minutes Intravenous Every 24 hours 10/26/19 1351 10/26/19 1600   10/26/19 1000  metroNIDAZOLE (FLAGYL) IVPB 500 mg  Status:  Discontinued        500 mg 100 mL/hr over 60 Minutes Intravenous Every 8 hours 10/26/19 0909 10/29/19 1138   10/25/19 1445  azithromycin (ZITHROMAX) 500 mg in sodium chloride 0.9 % 250 mL IVPB  Status:  Discontinued        500 mg 250 mL/hr over 60 Minutes Intravenous Every 24 hours 10/25/19 1353 10/29/19 1138   10/25/19 1230  azithromycin (ZITHROMAX) tablet 500 mg  Status:  Discontinued        500 mg Oral Daily 10/25/19 1133 10/25/19 1134   10/25/19 1030  cefTRIAXone (ROCEPHIN) 1 g in sodium chloride 0.9 % 100 mL IVPB  Status:  Discontinued        1 g 200 mL/hr over 30 Minutes Intravenous Every 12 hours 10/25/19 0931 10/26/19 1351         Subjective: Frustrated - speaks about 15-20 min about her frustrations about what happened yesterday  Objective: Vitals:   11/17/19 0411 11/17/19 0752 11/17/19 1146 11/17/19 1529  BP: 104/67 93/74 (!) 86/68 96/72  Pulse: 70 65 75 75  Resp: 18 18 16 18   Temp: 97.6 F (36.4 C) 98.3 F (36.8 C) 97.8 F (36.6 C) 98.5 F (36.9 C)  TempSrc: Oral Oral Oral Oral  SpO2: 99% 100% 97%   Weight:      Height:        Intake/Output Summary (Last 24 hours) at 11/17/2019 1656 Last data filed at 11/17/2019 1403 Gross per 24 hour  Intake 360 ml  Output 2 ml  Net 358 ml   Filed Weights   11/13/19 0744 11/13/19 1226 11/14/19 0515  Weight: 101.9 kg 101.9 kg 104.1 kg    Examination:  General: No acute distress. Cardiovascular: RRR Lungs: unlabored Abdomen: Soft, nontender, nondistended Neurological: Alert and oriented. Moves all extremities 4.  Cranial nerves II through XII grossly intact. Skin: Warm and dry. No rashes or lesions. Extremities: bilateral edema Psychiatric: poor  insight/judgement   Data Reviewed: I have personally reviewed following labs and imaging studies  CBC: Recent Labs  Lab 11/11/19 0433 11/11/19 0433 11/12/19 0618 11/13/19 0436 11/14/19 0358 11/15/19 0527 11/16/19 0822  WBC 6.5   < > 7.4 7.5 8.3 8.1 8.4  NEUTROABS 4.2  --  5.2  --  5.6 5.8 6.0  HGB 8.0*   < > 8.0* 7.7* 8.3* 7.5* 7.7*  HCT 24.2*   < > 24.5* 24.7* 25.7* 23.7* 24.0*  MCV 71.2*   < > 71.2* 72.4* 71.2* 71.4* 71.0*  PLT 491*   < > 467* 463* 526* 500* 522*   < > =  values in this interval not displayed.    Basic Metabolic Panel: Recent Labs  Lab 11/12/19 0618 11/13/19 0436 11/14/19 0358 11/15/19 0527 11/16/19 0822  NA 128* 128* 128* 129* 128*  K 4.2 4.6 4.8 4.9 5.1  CL 92* 92* 91* 94* 92*  CO2 26 25 24 23 25   GLUCOSE 112* 91 102* 87 101*  BUN 48* 51* 54* 60* 61*  CREATININE 3.12* 3.25* 3.44* 3.73* 3.93*  CALCIUM 8.5* 9.2 9.4 9.1 8.9  MG 1.7 1.8 1.7 1.7 1.7  PHOS 3.5  --  4.0 4.0 3.9    GFR: Estimated Creatinine Clearance: 17.9 mL/min (Katelyn Lane) (by C-G formula based on SCr of 3.93 mg/dL (H)).  Liver Function Tests: Recent Labs  Lab 11/12/19 0618 11/13/19 0436 11/14/19 0358 11/15/19 0527 11/16/19 0822  AST 26 26 24 29 26   ALT 11 11 12 12 10   ALKPHOS 306* 317* 328* 299* 252*  BILITOT 0.8 1.0 1.1 1.3* 1.0  PROT 5.8* 5.9* 6.7 6.0* 5.6*  ALBUMIN 2.2* 2.2* 2.4* 2.2* 2.3*    CBG: Recent Labs  Lab 11/11/19 0024  GLUCAP 168*     No results found for this or any previous visit (from the past 240 hour(s)).       Radiology Studies: No results found.      Scheduled Meds: . apixaban  5 mg Oral BID  . Chlorhexidine Gluconate Cloth  6 each Topical Daily  . ciprofloxacin  500 mg Oral Q breakfast  . haloperidol  5 mg Oral STAT   Or  . haloperidol lactate  5 mg Intramuscular STAT  . iron polysaccharides  150 mg Oral Daily  . mouth rinse  15 mL Mouth Rinse BID  . melatonin  2.5 mg Oral QHS  . midodrine  10 mg Oral TID WC  . multivitamin  1  tablet Oral QHS  . pantoprazole  40 mg Oral BID  . sodium chloride flush  3 mL Intravenous Q12H  . torsemide  40 mg Oral Daily  . traZODone  100 mg Oral QHS   Continuous Infusions: . sodium chloride Stopped (11/01/19 1131)     LOS: 29 days    Time spent: over 30 min    Fayrene Helper, MD [facility]   To contact the attending provider between 7A-7P or the covering provider during after hours 7P-7A, please log into the web site [email or web address] and access using universal [place] password for that web site. If you do not have the password, please call the hospital operator.  11/17/2019, 4:56 PM

## 2019-11-17 NOTE — Progress Notes (Signed)
Nutrition Follow Up Note   DOCUMENTATION CODES:   Obesity unspecified  INTERVENTION:   Discontinue Nepro per pt request   Pt declines all nutritional interventions  Rena-vit daily   NUTRITION DIAGNOSIS:   Increased nutrient needs related to chronic illness (cirrhosis, CHF) as evidenced by increased estimated needs.  GOAL:   Patient will meet greater than or equal to 90% of their needs  -progressing   MONITOR:   PO intake, Supplement acceptance, Labs, Weight trends, Skin, I & O's  ASSESSMENT:   64 y.o. 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  Pt s/p HD initiation 10/17 Pt s/p multiple paracentesis'   RD met with pt in room today. Pt reports good appetite and oral intake in hospital; pt documented to be eating 60-100% of meals. Pt is refusing Nepro supplements as she reports that she does not like them. RD offered to send pt a variety of supplements but pt refuses all nutritional interventions at this time. Pt reports that she is drinking milk with every meal. Recommend continue Rena-vit. Per chart, pt initially down 55lbs after admit but pt has regained ~19lbs since stopping HD. Pt currently refusing HD at time. Palliative care following for GOC.   Medications reviewed and include: ciprofloxacin, iron polysaccharides, melatonin, rena-vit, protonix, torsemide  Labs reviewed: Na 128(L), K 5.1 wnl, BUN 61(H), creat 3.93(H), P 3.9 wnl, Mg 1.7 wnl Wbc- Hgb 7.7(L), Hct 24.0(L), MCV 71.0(L), MCH 22.8(L)  Diet Order:   Diet Order            Diet Heart Room service appropriate? Yes; Fluid consistency: Thin; Fluid restriction: 1500 mL Fluid  Diet effective now                EDUCATION NEEDS:   Education needs have been  addressed  Skin:  Skin Assessment: Reviewed RN Assessment (non-pressure injury L leg)  Last BM:  11/9- type 4  Height:   Ht Readings from Last 1 Encounters:  11/13/19 $RemoveB'5\' 6"'[id number]$  (1.676 m)    Weight:   Wt Readings from Last 1 Encounters:  11/14/19 104.1 kg    Ideal Body Weight:  59 kg  BMI:  Body mass index is 37.04 kg/m.  Estimated Nutritional Needs:   Kcal:  1900-2200kcal/day  Protein:  95-110g/day  Fluid:  1.8-2L/day  Koleen Distance MS, RD, LDN Please refer to [facility] for RD and/or RD on-call/weekend/after hours pager

## 2019-11-17 NOTE — Progress Notes (Addendum)
Patient ID: Katelyn Lane, female   DOB: [birth year], 64 y.o.   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 Hospitalization complicated by need for dialysis and aspiration pneumonia. Today is day 28 of this hospital stay  Patient has declined initiation of dialysis for treatment of her worsening kidney function.  She has a one on one sitter and is easily agitated just coming into her room.  Disposition continues to be in question.  Unfortunately I see from psychiatry's notes that patient became emotionally agitated and uncooperative yesterday afternoon when discussing the specifics of her discharge plan.  For now patient has a sitter at the bedside and psychiatry will follow up in hopes of having a more rational conversation regarding discharge planning.  I was able to speak to her sister Katelyn Lane by telephone today and her brother Katelyn Lane  # [phone].  She voices her concern for her sisters overall health and wellbeing.  She understands the "she has burned all her bridges" with family and  various facilities in the area.  She tells me that the patient/Katelyn Lane "does things her way".  Her brother says" Mammie doesn't want to do right by [name]".    All her family is concerned for the patient but understand that her choices are affecting her outcomes, they express appreciation for care the hospital is "trying" to offer her.   Psychiatry plans to follow-up with patient today, hopefully they can offer suggestions for next steps in transitions of care.   Discussed with Dr Florene Glen     Total time spent on the unit was  25 minutes and TOC team   Greater than 50% of the time was spent in counseling and coordination of care  Wadie Lessen NP  Palliative Medicine Team Team Phone # [phone] Pager [phone]

## 2019-11-17 NOTE — Consult Note (Signed)
[facility] Consult   Reason for Consult: Follow-up consult for patient with mild dementia medical problems history of substance abuse Referring Physician: Florene Glen Patient Identification: Katelyn Lane MRN:  [id number] Principal Diagnosis: Spontaneous bacterial peritonitis ([place]) Diagnosis:  Principal Problem:   Spontaneous bacterial peritonitis ([place]) Active Problems:   AKI (acute kidney injury) ([place])   Crack cocaine use   Acute respiratory failure with hypoxia (HCC)   LV (left ventricular) mural thrombus   Chronic kidney disease (CKD) stage G3b/A1, moderately decreased glomerular filtration rate (GFR) between 30-44 mL/min/1.73 square meter and albuminuria creatinine ratio less than 30 mg/g (HCC)   Nonsustained ventricular tachycardia (HCC)   Acute CHF (congestive heart failure) (HCC)   Cocaine abuse ([place])   Peripheral edema   Cirrhosis of liver (HCC)   Dementia (HCC)   Aspiration pneumonia of both lower lobes due to gastric secretions (HCC)   Acute diastolic (congestive) heart failure (HCC)   Cardiorenal disease   Goals of care, counseling/discussion   Palliative care by specialist   Ascites due to alcoholic cirrhosis ([place])   Total Time spent with patient: 30 minutes  Subjective:   Katelyn Lane is a 64 y.o. female patient admitted with "you should not have done me like that".  HPI: Patient seen today.  Chart reviewed.  Spoke with hospitalist.  Patient was cooperative with the interview but difficult to redirect.  As she was yesterday she was dominating the conversation constantly changing the topic back to her complaints about the way that the incident yesterday afternoon was handled.  Patient told me that she was not planning to get up and walk out of the hospital tonight but was not really very convincing and still does not seem to be cooperating much on finding a place to stay  Past Psychiatric History: Has now been seen several times around this question of  capacity.  History of substance abuse and some recent dementia  Risk to Self:   Risk to Others:   Prior Inpatient Therapy:   Prior Outpatient Therapy:    Past Medical History:  Past Medical History:  Diagnosis Date  . CHF (congestive heart failure) ([place])   . COVID-19   . Hyperlipidemia   . Hypertension   . Renal disorder     Past Surgical History:  Procedure Laterality Date  . DIALYSIS/PERMA CATHETER INSERTION N/A 10/28/2019   Procedure: DIALYSIS/PERMA CATHETER INSERTION;  Surgeon: Katha Cabal, MD;  Location: [facility];  Service: Cardiovascular;  Laterality: N/A;  . DIALYSIS/PERMA CATHETER REMOVAL N/A 11/13/2019   Procedure: DIALYSIS/PERMA CATHETER REMOVAL;  Surgeon: Algernon Huxley, MD;  Location: [facility];  Service: Cardiovascular;  Laterality: N/A;  . RIGHT/LEFT HEART CATH AND CORONARY ANGIOGRAPHY N/A 12/30/2018   Procedure: RIGHT/LEFT HEART CATH AND CORONARY ANGIOGRAPHY;  Surgeon: Corey Skains, MD;  Location: [facility];  Service: Cardiovascular;  Laterality: N/A;   Family History:  Family History  Problem Relation Age of Onset  . Breast cancer Neg Hx    Family Psychiatric  History: See previous Social History:  Social History   Substance and Sexual Activity  Alcohol Use No  . Alcohol/week: 0.0 standard drinks     Social History   Substance and Sexual Activity  Drug Use Yes  . Types: Cocaine   Comment: 3-4 days ago    Social History   Socioeconomic History  . Marital status: Single    Spouse name: Not on file  . Number of children: Not on  file  . Years of education: Not on file  . Highest education level: Not on file  Occupational History  . Not on file  Tobacco Use  . Smoking status: Current Every Day Smoker  . Smokeless tobacco: Never Used  Substance and Sexual Activity  . Alcohol use: No    Alcohol/week: 0.0 standard drinks  . Drug use: Yes    Types: Cocaine    Comment: 3-4 days ago  . Sexual activity: Not  on file  Other Topics Concern  . Not on file  Social History Narrative  . Not on file   Social Determinants of Health   Financial Resource Strain:   . Difficulty of Paying Living Expenses: Not on file  Food Insecurity:   . Worried About Charity fundraiser in the Last Year: Not on file  . Ran Out of Food in the Last Year: Not on file  Transportation Needs:   . Lack of Transportation (Medical): Not on file  . Lack of Transportation (Non-Medical): Not on file  Physical Activity:   . Days of Exercise per Week: Not on file  . Minutes of Exercise per Session: Not on file  Stress:   . Feeling of Stress : Not on file  Social Connections:   . Frequency of Communication with Friends and Family: Not on file  . Frequency of Social Gatherings with Friends and Family: Not on file  . Attends Religious Services: Not on file  . Active Member of Clubs or Organizations: Not on file  . Attends Archivist Meetings: Not on file  . Marital Status: Not on file   Additional Social History:    Allergies:   Allergies  Allergen Reactions  . Penicillins Hives, Rash and Other (See Comments)    Did it involve swelling of the face/tongue/throat, SOB, or low BP? No Did it involve sudden or severe rash/hives, skin peeling, or any reaction on the inside of your mouth or nose? Yes Did you need to seek medical attention at a hospital or doctor's office? Yes When did it last happen? >25 years If all above answers are "NO", may proceed with cephalosporin use.   . Ace Inhibitors Nausea And Vomiting    Labs:  Results for orders placed or performed during the hospital encounter of 10/19/19 (from the past 48 hour(s))  CBC with Differential/Platelet     Status: Abnormal   Collection Time: 11/16/19  8:22 AM  Result Value Ref Range   WBC 8.4 4.0 - 10.5 K/uL   RBC 3.38 (L) 3.87 - 5.11 MIL/uL   Hemoglobin 7.7 (L) 12.0 - 15.0 g/dL    Comment: Reticulocyte Hemoglobin testing may be clinically  indicated, consider ordering this additional test [id number]    HCT 24.0 (L) 36 - 46 %   MCV 71.0 (L) 80.0 - 100.0 fL   MCH 22.8 (L) 26.0 - 34.0 pg   MCHC 32.1 30.0 - 36.0 g/dL   RDW 19.1 (H) 11.5 - 15.5 %   Platelets 522 (H) 150 - 400 K/uL   nRBC 0.0 0.0 - 0.2 %   Neutrophils Relative % 71 %   Neutro Abs 6.0 1.7 - 7.7 K/uL   Lymphocytes Relative 9 %   Lymphs Abs 0.8 0.7 - 4.0 K/uL   Monocytes Relative 13 %   Monocytes Absolute 1.1 (H) 0.1 - 1.0 K/uL   Eosinophils Relative 5 %   Eosinophils Absolute 0.4 0.0 - 0.5 K/uL   Basophils Relative 1 %  Basophils Absolute 0.1 0.0 - 0.1 K/uL   Immature Granulocytes 1 %   Abs Immature Granulocytes 0.05 0.00 - 0.07 K/uL    Comment: Performed at [facility], [address] [place], [place] [address]  Comprehensive metabolic panel     Status: Abnormal   Collection Time: 11/16/19  8:22 AM  Result Value Ref Range   Sodium 128 (L) 135 - 145 mmol/L   Potassium 5.1 3.5 - 5.1 mmol/L   Chloride 92 (L) 98 - 111 mmol/L   CO2 25 22 - 32 mmol/L   Glucose, Bld 101 (H) 70 - 99 mg/dL    Comment: Glucose reference range applies only to samples taken after fasting for at least 8 hours.   BUN 61 (H) 8 - 23 mg/dL   Creatinine, Ser 3.93 (H) 0.44 - 1.00 mg/dL   Calcium 8.9 8.9 - 10.3 mg/dL   Total Protein 5.6 (L) 6.5 - 8.1 g/dL   Albumin 2.3 (L) 3.5 - 5.0 g/dL   AST 26 15 - 41 U/L   ALT 10 0 - 44 U/L   Alkaline Phosphatase 252 (H) 38 - 126 U/L   Total Bilirubin 1.0 0.3 - 1.2 mg/dL   GFR, Estimated 12 (L) >60 mL/min    Comment: (NOTE) Calculated using the CKD-EPI Creatinine Equation (2021)    Anion gap 11 5 - 15    Comment: Performed at [facility], [address] [place], [place] [address]  Magnesium     Status: None   Collection Time: 11/16/19  8:22 AM  Result Value Ref Range   Magnesium 1.7 1.7 - 2.4 mg/dL    Comment: Performed at [facility], [address], [place], [place] [address]  Phosphorus     Status:  None   Collection Time: 11/16/19  8:22 AM  Result Value Ref Range   Phosphorus 3.9 2.5 - 4.6 mg/dL    Comment: Performed at [facility], [address] [place], [place] [address]  Protime-INR     Status: Abnormal   Collection Time: 11/16/19  8:22 AM  Result Value Ref Range   Prothrombin Time 32.0 (H) 11.4 - 15.2 seconds   INR 3.2 (H) 0.8 - 1.2    Comment: (NOTE) INR goal varies based on device and disease states. Performed at [facility], [address], [place],  [address]     Current Facility-Administered Medications  Medication Dose Route Frequency Provider Last Rate Last Admin  . 0.9 %  sodium chloride infusion  250 mL Intravenous PRN Schnier, Dolores Lory, MD   Stopped at 11/01/19 1131  . acetaminophen (TYLENOL) tablet 500 mg  500 mg Oral Q6H PRN Elodia Florence., MD   500 mg at 11/17/19 1550  . albuterol (PROVENTIL) (2.5 MG/3ML) 0.083% nebulizer solution 2.5 mg  2.5 mg Nebulization Q4H PRN Sharen Hones, MD      . alum & mag hydroxide-simeth (MAALOX/MYLANTA) 200-200-20 MG/5ML suspension 30 mL  30 mL Oral Q4H PRN Schnier, Dolores Lory, MD   30 mL at 10/23/19 2205   And  . lidocaine (XYLOCAINE) 2 % viscous mouth solution 15 mL  15 mL Oral Q4H PRN Delana Meyer Dolores Lory, MD   15 mL at 10/23/19 2205  . apixaban (ELIQUIS) tablet 5 mg  5 mg Oral BID Sharen Hones, MD   5 mg at 11/17/19 0934  . bisacodyl (DULCOLAX) suppository 10 mg  10 mg Rectal Daily PRN Schnier, Dolores Lory, MD   10 mg at  10/24/19 0459  . calcium carbonate (TUMS - dosed in mg elemental calcium) chewable tablet 200 mg of elemental calcium  1 tablet Oral TID PRN Sharen Hones, MD   200 mg of elemental calcium at 11/01/19 0949  . Chlorhexidine Gluconate Cloth 2 % PADS 6 each  6 each Topical Daily Schnier, Dolores Lory, MD   6 each at 11/16/19 [phone]  . ciprofloxacin (CIPRO) tablet 500 mg  500 mg Oral Q breakfast Elodia Florence., MD   500 mg at 11/17/19 0934  . diphenhydrAMINE (BENADRYL) capsule 25  mg  25 mg Oral Q6H PRN Sharion Settler, NP   25 mg at 11/14/19 2047  . diphenhydrAMINE (BENADRYL) capsule 50 mg  50 mg Oral Q6H PRN Elodia Florence., MD       Or  . diphenhydrAMINE (BENADRYL) injection 50 mg  50 mg Intramuscular Q6H PRN Elodia Florence., MD      . feeding supplement (NEPRO CARB STEADY) liquid 237 mL  237 mL Oral Q24H Sharen Hones, MD   237 mL at 11/13/19 1450  . haloperidol (HALDOL) tablet 5 mg  5 mg Oral STAT Annaliyah Willig T, MD       Or  . haloperidol lactate (HALDOL) injection 5 mg  5 mg Intramuscular STAT Jalina Blowers T, MD      . hydrOXYzine (ATARAX/VISTARIL) tablet 50 mg  50 mg Oral Q6H PRN Raeana Blinn T, MD      . iron polysaccharides (NIFEREX) capsule 150 mg  150 mg Oral Daily Sharen Hones, MD   150 mg at 11/17/19 1215  . loperamide (IMODIUM) capsule 2 mg  2 mg Oral PRN Sharen Hones, MD   2 mg at 11/09/19 0853  . MEDLINE mouth rinse  15 mL Mouth Rinse BID Schnier, Dolores Lory, MD   15 mL at 11/17/19 1100  . melatonin tablet 2.5 mg  2.5 mg Oral QHS Sharen Hones, MD   2.5 mg at 11/16/19 2327  . midodrine (PROAMATINE) tablet 10 mg  10 mg Oral TID WC Swayze, Ava, DO   10 mg at 11/17/19 1215  . multivitamin (RENA-VIT) tablet 1 tablet  1 tablet Oral QHS Swayze, Ava, DO   1 tablet at 11/16/19 2327  . ondansetron (ZOFRAN) injection 4 mg  4 mg Intravenous Q6H PRN Schnier, Dolores Lory, MD      . oxyCODONE-acetaminophen (PERCOCET/ROXICET) 5-325 MG per tablet 1 tablet  1 tablet Oral Q4H PRN Sharen Hones, MD   1 tablet at 11/14/19 1805  . pantoprazole (PROTONIX) EC tablet 40 mg  40 mg Oral BID Sharen Hones, MD   40 mg at 11/17/19 0934  . sodium chloride flush (NS) 0.9 % injection 3 mL  3 mL Intravenous Q12H Schnier, Dolores Lory, MD   3 mL at 11/16/19 0944  . sodium chloride flush (NS) 0.9 % injection 3 mL  3 mL Intravenous PRN Schnier, Dolores Lory, MD   3 mL at 10/28/19 1120  . torsemide (DEMADEX) tablet 40 mg  40 mg Oral Daily Elodia Florence., MD   40 mg at 11/17/19  1353  . traZODone (DESYREL) tablet 100 mg  100 mg Oral QHS Peg Fifer, Madie Reno, MD        Musculoskeletal: Strength & Muscle Tone: decreased Gait & Station: unsteady Patient leans: N/A  Psychiatric Specialty Exam: Physical Exam Vitals and nursing note reviewed.  Constitutional:      Appearance: She is well-developed.  HENT:     Head: Normocephalic and  atraumatic.  Eyes:     Conjunctiva/sclera: Conjunctivae normal.     Pupils: Pupils are equal, round, and reactive to light.  Cardiovascular:     Heart sounds: Normal heart sounds.  Pulmonary:     Effort: Pulmonary effort is normal.  Abdominal:     Palpations: Abdomen is soft.  Musculoskeletal:        General: Normal range of motion.     Cervical back: Normal range of motion.  Skin:    General: Skin is warm and dry.  Neurological:     General: No focal deficit present.     Mental Status: She is alert.  Psychiatric:        Attention and Perception: She is inattentive.        Mood and Affect: Affect is labile and inappropriate.        Speech: Speech is rapid and pressured.        Behavior: Behavior is agitated. Behavior is not aggressive.        Thought Content: Thought content does not include homicidal or suicidal ideation.        Cognition and Memory: Cognition is impaired. Memory is impaired.        Judgment: Judgment is impulsive and inappropriate.     Review of Systems  Constitutional: Negative.   HENT: Negative.   Eyes: Negative.   Respiratory: Positive for shortness of breath.   Cardiovascular: Negative.   Gastrointestinal: Negative.   Musculoskeletal: Negative.   Skin: Negative.   Neurological: Negative.   Psychiatric/Behavioral: Positive for agitation. Negative for suicidal ideas.    Blood pressure 96/72, pulse 75, temperature 98.5 F (36.9 C), temperature source Oral, resp. rate 18, height 5\' 6"  (1.676 m), weight 104.1 kg, SpO2 97 %.Body mass index is 37.04 kg/m.  General Appearance: Casual  Eye Contact:   Minimal  Speech:  Garbled  Volume:  Increased  Mood:  Irritable  Affect:  Congruent  Thought Process:  Disorganized  Orientation:  Negative  Thought Content:  Illogical, Paranoid Ideation, Rumination and Tangential  Suicidal Thoughts:  No  Homicidal Thoughts:  No  Memory:  Immediate;   Fair Recent;   Poor Remote;   Poor  Judgement:  Impaired  Insight:  Shallow  Psychomotor Activity:  Restlessness  Concentration:  Concentration: Poor  Recall:  Poor  Fund of Knowledge:  Poor  Language:  Poor  Akathisia:  No  Handed:  Right  AIMS (if indicated):     Assets:  Resilience  ADL's:  Impaired  Cognition:  Impaired,  Mild  Sleep:        Treatment Plan Summary: Daily contact with patient to assess and evaluate symptoms and progress in treatment, Medication management and Plan  Exposure to the patient's behavior last night and has offered new perspective on her thinking processes that allows for a better understanding of her capacity.  Some of her arm capacity probably varies throughout the day but on the whole it is now clear that she is not in practice willing to cooperate with finding any kind of safe plan and she is too emotionally labile to engage in a rational conversation.  Based on that I have changed my opinion back to how I had felt previously that she does not really have capacity for safe decisions about living situation.  I am going to put in orders for as needed medication in case it should be needed.  Case reviewed with hospitalist.  Disposition: Supportive therapy provided about ongoing stressors.  Alethia Berthold, MD 11/17/2019 4:13 PM

## 2019-11-17 NOTE — TOC Progression Note (Signed)
Transition of Care [facility]) - Progression Note    Patient Details  Name: Treniya Lobb MRN: [id number] Date of Birth: Jul 10, 1955  Transition of Care [facility]) CM/SW Barry, RN Phone Number: 11/17/2019, 10:58 AM  Clinical Narrative:   RNCM spoke with Raymond Gurney with DSS who attempted to talk with patient at bedside to update on her case. [name] understands that patient does not have placement options in [place] and that she is refusing to go anywhere else. [name] plans to discuss option of a possible family care home placement with patient to see if she may be agreeable to this option.          Expected Discharge Plan and Services                                                 Social Determinants of Health (SDOH) Interventions    Readmission Risk Interventions Readmission Risk Prevention Plan 09/02/2019 08/14/2019 07/27/2019  Transportation Screening Complete Complete Complete  Medication Review [employer]) Complete Referral to Pharmacy -  PCP or Specialist appointment within 3-5 days of discharge Complete Complete Complete  HRI or Home Care Consult - Complete Complete  SW Recovery Care/Counseling Consult - Complete Complete  Palliative Care Screening Not Applicable Not Applicable Not Applicable  Skilled Nursing Facility Complete Not Applicable Not Applicable  Some recent data might be hidden

## 2019-11-18 DIAGNOSIS — K652 Spontaneous bacterial peritonitis: Secondary | ICD-10-CM | POA: Diagnosis not present

## 2019-11-18 NOTE — Progress Notes (Signed)
Pt is becoming more aggressive with the staff (tech) at this time and refusing to sit on the bed in a safe manner, pt insists on sitting on the side of the bed (which is a low air loss mattress that she was placed on this morning due to complaints of back pain). Pt aware that staff can not leave her sitting on the side of the bed without being physically monitored due to safety concerns. Pt states, "I am going to sit here, I will not fall."  Pt then states, "The head of the bed doesn't sit high enough (parameter built into bed) and she feels as if she isn't swallowing foods safely with head of bed at the 75 degree angle. MD Pahwani aware and provided new order for pt to be placed on a 1:1 monitoring and also change the bed back to a regular bed.

## 2019-11-18 NOTE — Progress Notes (Signed)
Pt transferred to low air loss mattress at this time due to voiced concerns of back pain.

## 2019-11-18 NOTE — Progress Notes (Signed)
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  After admission to the hospital, she was diagnosed with acute on chronic combinedsystolic and diastolic congestive heart failurewith ejection fraction 25 to 30%. She was not responding to IV Lasix. She was seen by nephrology, temporary dialysis catheter was performed and she was dialyzed on 10/17 and 10/18.Patient also complaining of abdominal pain, she had a paracentesis, study showed possible spontaneous peritonitis, she was treated with Rocephin and Flagyl.  Patient had a worsening respiratory status on 10/17, was diagnosed with aspiration pneumonia. She was treated with Rocephin, Zithromax and Flagyl. Patient also had abdominal pain and a CT scan suspect ischemia, but seen by general surgery on 10/19, no need for surgery.  Assessment & Plan:  Spontaneous bacterial peritonitis: Repeat paracentesis showed significant decrease of white cell in ascites, antibiotics effective. Hoever she still has a lot of neutrophils in 11/03/2019 fluid. Culture has been negative.Pt received many days ofRocephin, sincediscontinued. Monitor. -Continue cipro for ppx   Acute on chronic combined systolic and diastolic congestive heart failure. Acute hypoxemic respiratory failure. She was dialyzed x2, but now getting diuresed. Pt reported does not want to get dialysis again. - repeat CXR with improved edema 11/7 -nephrology c/s, appreciate recs (see 11/8 note) - torsemide 40 mg daily  Liver  cirrhosis with ascites:Noted. GI consulted previously in hospitalization (see prior notes). -Continue cipro for SBP ppx  Hypotension: BP soft this morning.  Continue midorine  Acute renal failure on chronic kidney disease stage IIIb: baseline creatinine appears to be 1.66. Due to cardiorenal syndrome. Creatinine peaked at 4.16 -No plan for further dialysis per renal. Pt reportedly does not want dialysis again -renal transitioned to torsemide 40 mg   Hyponatremia: Mild.  -Continue to monitor.  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 However, there is no GI bleed observed while in the hospital. It is not safe to continue hold anticoagulation. Anticoagulation has now been restarted with eliquis.  -Monitor hemoglobin. -cont Eliquis  Overgrown toenails: will need outpatient podiatry follow up  Anemia of chronic disease:  -In the setting of CKD.  H&H is stable -cont iron supplement  Hypokalemia:Resolved.  Goals of care: appreciate palliative care involvement.  Complex situation overall.  She's now DNR with no plans for dialysis.  GOC are often difficult due to her orientation and limited insight at times, but she's been relatively consistent in her overall goals (no dialysis or heroic measures, consistent with her DNR).  Limited insight at times makes discharge planning more complex.  She was seen earlier by psych on 10/21 who noted that "she is mostly unable to make decisions for herself safely about her overall treatment". On 11/8, she attempted to leave against medical advice.  No safe DC plan was identified at that time.  Per Dr. Weber Cooks, she does not have capacity for safe decisions about living situation.  Overall a difficult situation, DSS working with [facility] team as well.  DVT prophylaxis: Eliquis Code Status: DNR Family Communication:  None present at bedside.  Plan of care  discussed with patient in length and she verbalized understanding and agreed with it.  Talked to patient's sister Katelyn Lane over the phone. She is not POA however she & patient's other siblings are aware of the patient current situation.  Disposition Plan: ?SNF  Consultants:  Palliative Nephrology GI  Procedures:  10/20 perma cath insertion perma cath removal 11/5  Antimicrobials:   As per chart  Status is: Inpatient  Dispo: The patient is from: home              Anticipated d/c is to: Pending              Anticipated d/c date is: >3 days              Patient currently Not medically stable for the discharge  Subjective:  Patient seen and examined.  Eating breakfast.  Tells me that she is feeling well overall.  Has no complaints.  Objective: Vitals:   11/17/19 1146 11/17/19 1529 11/17/19 2007 11/18/19 0731  BP: (!) 86/68 96/72 90/72  108/84  Pulse: 75 75 68 94  Resp: 16 18 17 17   Temp: 97.8 F (36.6 C) 98.5 F (36.9 C) 98.1 F (36.7 C) 98.6 F (37 C)  TempSrc: Oral Oral  Oral  SpO2: 97%  100% 99%  Weight:      Height:        Intake/Output Summary (Last 24 hours) at 11/18/2019 1618 Last data filed at 11/18/2019 1000 Gross per 24 hour  Intake 240 ml  Output 1 ml  Net 239 ml   Filed Weights   11/13/19 0744 11/13/19 1226 11/14/19 0515  Weight: 101.9 kg 101.9 kg 104.1 kg    Examination:  General exam: Appears calm and comfortable, on room air, communicating well, not in acute distress. Respiratory system: Clear to auscultation. Respiratory effort normal. Cardiovascular system: S1 & S2 heard, RRR. No JVD, murmurs, rubs, gallops or clicks. No pedal edema. Gastrointestinal system: Abdomen is nondistended, soft and nontender. No organomegaly or masses felt. Normal bowel sounds heard. Central nervous system: Alert and oriented. No focal neurological deficits. Extremities: Symmetric 5 x 5 power. Skin: No rashes, lesions or ulcers   Data Reviewed: I have personally  reviewed following labs and imaging studies  CBC: Recent Labs  Lab 11/12/19 0618 11/13/19 0436 11/14/19 0358 11/15/19 0527 11/16/19 0822  WBC 7.4 7.5 8.3 8.1 8.4  NEUTROABS 5.2  --  5.6 5.8 6.0  HGB 8.0* 7.7* 8.3* 7.5* 7.7*  HCT 24.5* 24.7* 25.7* 23.7* 24.0*  MCV 71.2* 72.4* 71.2* 71.4* 71.0*  PLT 467* 463* 526* 500* 053*   Basic Metabolic Panel: Recent Labs  Lab 11/12/19 0618 11/13/19 0436 11/14/19 0358 11/15/19 0527 11/16/19 0822  NA 128* 128* 128* 129* 128*  K 4.2 4.6 4.8 4.9 5.1  CL 92* 92* 91* 94* 92*  CO2 26 25 24 23 25   GLUCOSE 112* 91 102* 87 101*  BUN 48* 51* 54* 60* 61*  CREATININE 3.12* 3.25* 3.44* 3.73* 3.93*  CALCIUM 8.5* 9.2 9.4 9.1 8.9  MG 1.7 1.8 1.7 1.7 1.7  PHOS 3.5  --  4.0 4.0 3.9   GFR: Estimated Creatinine Clearance: 17.9 mL/min (A) (by C-G formula based on SCr of 3.93 mg/dL (H)). Liver Function Tests: Recent Labs  Lab 11/12/19 0618 11/13/19 0436 11/14/19 0358 11/15/19 0527 11/16/19 0822  AST 26 26 24 29 26   ALT 11 11 12 12 10   ALKPHOS 306* 317* 328* 299* 252*  BILITOT 0.8 1.0 1.1 1.3* 1.0  PROT 5.8* 5.9* 6.7 6.0* 5.6*  ALBUMIN 2.2* 2.2* 2.4* 2.2* 2.3*   No results for input(s): LIPASE, AMYLASE in the last 168 hours. No results for input(s): AMMONIA in the last 168 hours. Coagulation Profile: Recent Labs  Lab 11/14/19 0358 11/16/19 0822  INR 2.4* 3.2*   Cardiac Enzymes: No results for input(s): CKTOTAL, CKMB, CKMBINDEX, TROPONINI in the last 168 hours. BNP (last 3 results) No results for input(s): PROBNP in the last 8760 hours. HbA1C: No results for input(s): HGBA1C in the last 72 hours. CBG: No results for input(s): GLUCAP in the last 168 hours. Lipid Profile: No results for input(s): CHOL, HDL, LDLCALC, TRIG, CHOLHDL, LDLDIRECT in the last 72 hours. Thyroid Function Tests: No results for input(s): TSH, T4TOTAL, FREET4, T3FREE, THYROIDAB in the last 72 hours. Anemia Panel: No results for input(s): VITAMINB12, FOLATE,  FERRITIN, TIBC, IRON, RETICCTPCT in the last 72 hours. Sepsis Labs: No results for input(s): PROCALCITON, LATICACIDVEN in the last 168 hours.  No results found for this or any previous visit (from the past 240 hour(s)).    Radiology Studies: No results found.  Scheduled Meds: . apixaban  5 mg Oral BID  . Chlorhexidine Gluconate Cloth  6 each Topical Daily  . ciprofloxacin  500 mg Oral Q breakfast  . iron polysaccharides  150 mg Oral Daily  . mouth rinse  15 mL Mouth Rinse BID  . melatonin  2.5 mg Oral QHS  . midodrine  10 mg Oral TID WC  . multivitamin  1 tablet Oral QHS  . pantoprazole  40 mg Oral BID  . sodium chloride flush  3 mL Intravenous Q12H  . torsemide  40 mg Oral Daily  . traZODone  100 mg Oral QHS   Continuous Infusions: . sodium chloride Stopped (11/01/19 1131)     LOS: 30 days   Time spent: 40 minutes  Shondrika Hoque Loann Quill, MD [facility]  If 7PM-7AM, please contact night-coverage [email or web address] 11/18/2019, 4:18 PM

## 2019-11-18 NOTE — Progress Notes (Signed)
Assumed care of pt at this time. Pt resting in bed with eyes open and watching tv, no 1:1 sitter noted at bedside, no tele-monitor in room either. Pt appears to be in a positive mood at this time, call bell within reach, NAD noted, will continue to monitor.

## 2019-11-18 NOTE — Plan of Care (Signed)
  Problem: Education: Goal: Knowledge of General Education information will improve Description: Including pain rating scale, medication(s)/side effects and non-pharmacologic comfort measures Outcome: Progressing   Problem: Health Behavior/Discharge Planning: Goal: Ability to manage health-related needs will improve Outcome: Progressing   Problem: Clinical Measurements: Goal: Ability to maintain clinical measurements within normal limits will improve Outcome: Progressing Goal: Will remain free from infection Outcome: Progressing Goal: Diagnostic test results will improve Outcome: Progressing Goal: Respiratory complications will improve Outcome: Progressing Goal: Cardiovascular complication will be avoided Outcome: Progressing   Problem: Activity: Goal: Risk for activity intolerance will decrease Outcome: Progressing   Problem: Nutrition: Goal: Adequate nutrition will be maintained Outcome: Progressing   Problem: Coping: Goal: Level of anxiety will decrease Outcome: Progressing   Problem: Elimination: Goal: Will not experience complications related to bowel motility Outcome: Progressing Goal: Will not experience complications related to urinary retention Outcome: Progressing   Problem: Pain Managment: Goal: General experience of comfort will improve Outcome: Progressing   Problem: Safety: Goal: Ability to remain free from injury will improve Outcome: Progressing   Problem: Skin Integrity: Goal: Risk for impaired skin integrity will decrease Outcome: Progressing   Problem: Education: Goal: Ability to demonstrate management of disease process will improve Outcome: Progressing Goal: Ability to verbalize understanding of medication therapies will improve Outcome: Progressing Goal: Individualized Educational Video(s) Outcome: Progressing   Problem: Activity: Goal: Capacity to carry out activities will improve Outcome: Progressing   Problem: Cardiac: Goal:  Ability to achieve and maintain adequate cardiopulmonary perfusion will improve Outcome: Progressing   Problem: Education: Goal: Knowledge of disease and its progression will improve Outcome: Progressing Goal: Individualized Educational Video(s) Outcome: Progressing   Problem: Fluid Volume: Goal: Compliance with measures to maintain balanced fluid volume will improve Outcome: Progressing   Problem: Health Behavior/Discharge Planning: Goal: Ability to manage health-related needs will improve Outcome: Progressing   Problem: Nutritional: Goal: Ability to make healthy dietary choices will improve Outcome: Progressing   Problem: Clinical Measurements: Goal: Complications related to the disease process, condition or treatment will be avoided or minimized Outcome: Progressing

## 2019-11-18 NOTE — Consult Note (Signed)
[facility] Consult   Reason for Consult: Follow-up for this patient with multiple medical problems and mild dementia. Have been following this patient for capacity and now behavior issues. Referring Physician:  Pahwani Patient Identification: Katelyn Lane MRN:  [id number] Principal Diagnosis: Spontaneous bacterial peritonitis ([place]) Diagnosis:  Principal Problem:   Spontaneous bacterial peritonitis ([place]) Active Problems:   AKI (acute kidney injury) ([place])   Crack cocaine use   Acute respiratory failure with hypoxia (HCC)   LV (left ventricular) mural thrombus   Chronic kidney disease (CKD) stage G3b/A1, moderately decreased glomerular filtration rate (GFR) between 30-44 mL/min/1.73 square meter and albuminuria creatinine ratio less than 30 mg/g (HCC)   Nonsustained ventricular tachycardia (HCC)   Acute CHF (congestive heart failure) (HCC)   Cocaine abuse ([place])   Peripheral edema   Cirrhosis of liver (HCC)   Dementia (HCC)   Aspiration pneumonia of both lower lobes due to gastric secretions (HCC)   Acute diastolic (congestive) heart failure (HCC)   Cardiorenal disease   Goals of care, counseling/discussion   Palliative care by specialist   Ascites due to alcoholic cirrhosis ([place])   Total Time spent with patient: 30 minutes  Subjective:   Katelyn Lane is a 64 y.o. female patient admitted with "I am okay".  HPI: See previous notes. 64 year old woman in the hospital with multiple medical problems including heart failure fluid retention and breathing difficulties. Concern had been raised on several occasions about her capacity to make sound decisions. Patient had superficially to me seem to have capacity but then had an episode of behavior the night before last that provided a clear picture of her lack of capacity. Today the patient has no specific new complaints. She asked me if I could please turn off the fan in the room. I told her the fan she was hearing was inflating  the air mattress she was on which was helping to prevent worsening bedsores. She seemed to understand this and dropped to the issue. Patient does not show any new awareness of any plan for discharge. She tells me that she plans today to take a nap and then pray about it. I endorse this as a good plan. She also says she wants to talk with her brother. We know the brother exists but it is unclear whether she is actually staying in contact with him. Affect was calm her today not agitated or hostile during the time I interacted with her  Past Psychiatric History: History of substance abuse and behavior problems now with what appears to be some degree of cognitive impairment  Risk to Self:   Risk to Others:   Prior Inpatient Therapy:   Prior Outpatient Therapy:    Past Medical History:  Past Medical History:  Diagnosis Date  . CHF (congestive heart failure) ([place])   . COVID-19   . Hyperlipidemia   . Hypertension   . Renal disorder     Past Surgical History:  Procedure Laterality Date  . DIALYSIS/PERMA CATHETER INSERTION N/A 10/28/2019   Procedure: DIALYSIS/PERMA CATHETER INSERTION;  Surgeon: Katha Cabal, MD;  Location: [facility];  Service: Cardiovascular;  Laterality: N/A;  . DIALYSIS/PERMA CATHETER REMOVAL N/A 11/13/2019   Procedure: DIALYSIS/PERMA CATHETER REMOVAL;  Surgeon: Algernon Huxley, MD;  Location: [facility];  Service: Cardiovascular;  Laterality: N/A;  . RIGHT/LEFT HEART CATH AND CORONARY ANGIOGRAPHY N/A 12/30/2018   Procedure: RIGHT/LEFT HEART CATH AND CORONARY ANGIOGRAPHY;  Surgeon: Corey Skains, MD;  Location: [facility];  Service: Cardiovascular;  Laterality: N/A;   Family History:  Family History  Problem Relation Age of Onset  . Breast cancer Neg Hx    Family Psychiatric  History: See previous Social History:  Social History   Substance and Sexual Activity  Alcohol Use No  . Alcohol/week: 0.0 standard drinks     Social History    Substance and Sexual Activity  Drug Use Yes  . Types: Cocaine   Comment: 3-4 days ago    Social History   Socioeconomic History  . Marital status: Single    Spouse name: Not on file  . Number of children: Not on file  . Years of education: Not on file  . Highest education level: Not on file  Occupational History  . Not on file  Tobacco Use  . Smoking status: Current Every Day Smoker  . Smokeless tobacco: Never Used  Substance and Sexual Activity  . Alcohol use: No    Alcohol/week: 0.0 standard drinks  . Drug use: Yes    Types: Cocaine    Comment: 3-4 days ago  . Sexual activity: Not on file  Other Topics Concern  . Not on file  Social History Narrative  . Not on file   Social Determinants of Health   Financial Resource Strain:   . Difficulty of Paying Living Expenses: Not on file  Food Insecurity:   . Worried About Charity fundraiser in the Last Year: Not on file  . Ran Out of Food in the Last Year: Not on file  Transportation Needs:   . Lack of Transportation (Medical): Not on file  . Lack of Transportation (Non-Medical): Not on file  Physical Activity:   . Days of Exercise per Week: Not on file  . Minutes of Exercise per Session: Not on file  Stress:   . Feeling of Stress : Not on file  Social Connections:   . Frequency of Communication with Friends and Family: Not on file  . Frequency of Social Gatherings with Friends and Family: Not on file  . Attends Religious Services: Not on file  . Active Member of Clubs or Organizations: Not on file  . Attends Archivist Meetings: Not on file  . Marital Status: Not on file   Additional Social History:    Allergies:   Allergies  Allergen Reactions  . Penicillins Hives, Rash and Other (See Comments)    Did it involve swelling of the face/tongue/throat, SOB, or low BP? No Did it involve sudden or severe rash/hives, skin peeling, or any reaction on the inside of your mouth or nose? Yes Did you need to  seek medical attention at a hospital or doctor's office? Yes When did it last happen? >25 years If all above answers are "NO", may proceed with cephalosporin use.   . Ace Inhibitors Nausea And Vomiting    Labs: No results found for this or any previous visit (from the past 48 hour(s)).  Current Facility-Administered Medications  Medication Dose Route Frequency Provider Last Rate Last Admin  . 0.9 %  sodium chloride infusion  250 mL Intravenous PRN Schnier, Dolores Lory, MD   Stopped at 11/01/19 1131  . acetaminophen (TYLENOL) tablet 500 mg  500 mg Oral Q6H PRN Elodia Florence., MD   500 mg at 11/17/19 2048  . albuterol (PROVENTIL) (2.5 MG/3ML) 0.083% nebulizer solution 2.5 mg  2.5 mg Nebulization Q4H PRN Sharen Hones, MD      . alum &  mag hydroxide-simeth (MAALOX/MYLANTA) 200-200-20 MG/5ML suspension 30 mL  30 mL Oral Q4H PRN Schnier, Dolores Lory, MD   30 mL at 10/23/19 2205   And  . lidocaine (XYLOCAINE) 2 % viscous mouth solution 15 mL  15 mL Oral Q4H PRN Schnier, Dolores Lory, MD   15 mL at 10/23/19 2205  . apixaban (ELIQUIS) tablet 5 mg  5 mg Oral BID Sharen Hones, MD   5 mg at 11/18/19 1016  . bisacodyl (DULCOLAX) suppository 10 mg  10 mg Rectal Daily PRN Schnier, Dolores Lory, MD   10 mg at 10/24/19 0459  . calcium carbonate (TUMS - dosed in mg elemental calcium) chewable tablet 200 mg of elemental calcium  1 tablet Oral TID PRN Sharen Hones, MD   200 mg of elemental calcium at 11/01/19 0949  . Chlorhexidine Gluconate Cloth 2 % PADS 6 each  6 each Topical Daily Schnier, Dolores Lory, MD   6 each at 11/16/19 [id number]  . ciprofloxacin (CIPRO) tablet 500 mg  500 mg Oral Q breakfast Elodia Florence., MD   500 mg at 11/18/19 8768  . diphenhydrAMINE (BENADRYL) capsule 50 mg  50 mg Oral Q6H PRN Elodia Florence., MD       Or  . diphenhydrAMINE (BENADRYL) injection 50 mg  50 mg Intramuscular Q6H PRN Elodia Florence., MD      . hydrOXYzine (ATARAX/VISTARIL) tablet 50 mg  50 mg Oral  Q6H PRN Nefertiti Mohamad T, MD      . iron polysaccharides (NIFEREX) capsule 150 mg  150 mg Oral Daily Sharen Hones, MD   150 mg at 11/17/19 1215  . loperamide (IMODIUM) capsule 2 mg  2 mg Oral PRN Sharen Hones, MD   2 mg at 11/09/19 0853  . MEDLINE mouth rinse  15 mL Mouth Rinse BID Schnier, Dolores Lory, MD   15 mL at 11/17/19 1100  . melatonin tablet 2.5 mg  2.5 mg Oral QHS Sharen Hones, MD   2.5 mg at 11/17/19 2128  . midodrine (PROAMATINE) tablet 10 mg  10 mg Oral TID WC Swayze, Ava, DO   10 mg at 11/18/19 0808  . multivitamin (RENA-VIT) tablet 1 tablet  1 tablet Oral QHS Swayze, Ava, DO   1 tablet at 11/16/19 2327  . ondansetron (ZOFRAN) injection 4 mg  4 mg Intravenous Q6H PRN Schnier, Dolores Lory, MD      . oxyCODONE-acetaminophen (PERCOCET/ROXICET) 5-325 MG per tablet 1 tablet  1 tablet Oral Q4H PRN Sharen Hones, MD   1 tablet at 11/14/19 1805  . pantoprazole (PROTONIX) EC tablet 40 mg  40 mg Oral BID Sharen Hones, MD   40 mg at 11/18/19 1016  . sodium chloride flush (NS) 0.9 % injection 3 mL  3 mL Intravenous Q12H Schnier, Dolores Lory, MD   3 mL at 11/18/19 0235  . sodium chloride flush (NS) 0.9 % injection 3 mL  3 mL Intravenous PRN Schnier, Dolores Lory, MD   3 mL at 10/28/19 1120  . torsemide (DEMADEX) tablet 40 mg  40 mg Oral Daily Elodia Florence., MD   40 mg at 11/18/19 1016  . traZODone (DESYREL) tablet 100 mg  100 mg Oral QHS Yannet Rincon, Madie Reno, MD   100 mg at 11/17/19 2128    Musculoskeletal: Strength & Muscle Tone: decreased Gait & Station: unsteady Patient leans: N/A  Psychiatric Specialty Exam: Physical Exam Vitals and nursing note reviewed.  Constitutional:      Appearance: She is  well-developed.  HENT:     Head: Normocephalic and atraumatic.  Eyes:     Conjunctiva/sclera: Conjunctivae normal.     Pupils: Pupils are equal, round, and reactive to light.  Cardiovascular:     Heart sounds: Normal heart sounds.  Pulmonary:     Effort: Pulmonary effort is normal.   Abdominal:     Palpations: Abdomen is soft.  Musculoskeletal:        General: Normal range of motion.     Cervical back: Normal range of motion.  Skin:    General: Skin is warm and dry.  Neurological:     General: No focal deficit present.     Mental Status: She is alert.  Psychiatric:        Attention and Perception: She is inattentive.        Mood and Affect: Affect is labile.        Speech: Speech is tangential.        Behavior: Behavior is agitated. Behavior is not aggressive.        Thought Content: Thought content does not include homicidal or suicidal ideation.        Cognition and Memory: Cognition is impaired. Memory is impaired.        Judgment: Judgment is impulsive and inappropriate.     Review of Systems  Constitutional: Negative.   HENT: Negative.   Eyes: Negative.   Respiratory: Negative.   Cardiovascular: Negative.   Gastrointestinal: Negative.   Musculoskeletal: Negative.   Skin: Negative.   Neurological: Negative.   Psychiatric/Behavioral: Negative.     Blood pressure 108/84, pulse 94, temperature 98.6 F (37 C), temperature source Oral, resp. rate 17, height 5\' 6"  (1.676 m), weight 104.1 kg, SpO2 99 %.Body mass index is 37.04 kg/m.  General Appearance: Casual  Eye Contact:  Fair  Speech:  Slow  Volume:  Decreased  Mood:  Euthymic  Affect:  Constricted  Thought Process:  Disorganized  Orientation:  Full (Time, Place, and Person)  Thought Content:  Tangential  Suicidal Thoughts:  No  Homicidal Thoughts:  No  Memory:  Immediate;   Fair Recent;   Fair Remote;   Poor  Judgement:  Impaired  Insight:  Shallow  Psychomotor Activity:  Decreased  Concentration:  Concentration: Poor  Recall:  Poor  Fund of Knowledge:  Fair  Language:  Fair  Akathisia:  No  Handed:  Right  AIMS (if indicated):     Assets:  Desire for Improvement Resilience  ADL's:  Impaired  Cognition:  Impaired,  Mild  Sleep:        Treatment Plan Summary: Plan To me she  seems like she might be a little more physically unwell today. When I first came into the room she had a pained look on her face and almost looked like she was having trouble breathing. When I ask her specifically about physical symptoms however she denied as usual having any physical discomfort. Nevertheless she certainly seems more tired and rundown today. Does not suggest to me that she is planning to get up and get out of bed although her level of understanding of her situation is no better. At this point I continue with my belief that she does not have capacity to make reasonable thoughtful decisions especially given her complex situation. No change to the medicines I had ordered as as needed's. Supportive therapy. I will continue to follow as needed.  Disposition: Patient does not meet criteria for psychiatric inpatient admission. Supportive therapy provided  about ongoing stressors.  Alethia Berthold, MD 11/18/2019 10:22 AM

## 2019-11-19 DIAGNOSIS — K652 Spontaneous bacterial peritonitis: Secondary | ICD-10-CM | POA: Diagnosis not present

## 2019-11-19 NOTE — TOC Progression Note (Signed)
Transition of Care [facility]) - Progression Note    Patient Details  Name: Katelyn Lane MRN: [id number] Date of Birth: 1955-02-04  Transition of Care [facility]) CM/SW [place], RN Phone Number: 11/19/2019, 1:24 PM  Clinical Narrative:   RNCM reached out to Raymond Gurney with DSS for update, left VM for return call.          Expected Discharge Plan and Services                                                 Social Determinants of Health (SDOH) Interventions    Readmission Risk Interventions Readmission Risk Prevention Plan 09/02/2019 08/14/2019 07/27/2019  Transportation Screening Complete Complete Complete  Medication Review Press photographer) Complete Referral to Pharmacy -  PCP or Specialist appointment within 3-5 days of discharge Complete Complete Complete  HRI or Home Care Consult - Complete Complete  SW Recovery Care/Counseling Consult - Complete Complete  Palliative Care Screening Not Applicable Not Applicable Not Applicable  Skilled Nursing Facility Complete Not Applicable Not Applicable  Some recent data might be hidden

## 2019-11-19 NOTE — Plan of Care (Signed)
  Problem: Activity: Goal: Risk for activity intolerance will decrease Outcome: Progressing   Problem: Nutrition: Goal: Adequate nutrition will be maintained Outcome: Progressing   Problem: Education: Goal: Knowledge of General Education information will improve Description: Including pain rating scale, medication(s)/side effects and non-pharmacologic comfort measures Outcome: Not Progressing   Problem: Health Behavior/Discharge Planning: Goal: Ability to manage health-related needs will improve Outcome: Not Progressing

## 2019-11-19 NOTE — Progress Notes (Signed)
PT Cancellation Note  Patient Details Name: Katelyn Lane MRN: [id number] DOB: 08-Jan-1956   Cancelled Treatment:    Reason Eval/Treat Not Completed: Patient declined to participate with PT services this date secondary to having just eaten.  Pt requested to re-attempt PT services tomorrow.  Patient goals to be assessed/updated as needed during next session with patient.    Linus Salmons PT, DPT 11/19/19, 2:48 PM

## 2019-11-19 NOTE — TOC Progression Note (Signed)
Transition of Care [facility]) - Progression Note    Patient Details  Name: Katelyn Lane MRN: [id number] Date of Birth: [birth year]  Transition of Care [facility]) CM/SW [place], RN Phone Number: 11/19/2019, 7:56 AM  Clinical Narrative:   RNCM communicated with patients' DSS worker Raymond Gurney of updated recommendations from psychiatry that patient does not have capacity to make decisions concerning her living situation. [name] reported that she would communicate this to her supervisor and get back in touch.          Expected Discharge Plan and Services                                                 Social Determinants of Health (SDOH) Interventions    Readmission Risk Interventions Readmission Risk Prevention Plan 09/02/2019 08/14/2019 07/27/2019  Transportation Screening Complete Complete Complete  Medication Review Press photographer) Complete Referral to Pharmacy -  PCP or Specialist appointment within 3-5 days of discharge Complete Complete Complete  HRI or Home Care Consult - Complete Complete  SW Recovery Care/Counseling Consult - Complete Complete  Palliative Care Screening Not Applicable Not Applicable Not Applicable  Skilled Nursing Facility Complete Not Applicable Not Applicable  Some recent data might be hidden

## 2019-11-19 NOTE — Progress Notes (Signed)
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  After admission to the hospital, she was diagnosed with acute on chronic combinedsystolic and diastolic congestive heart failurewith ejection fraction 25 to 30%. She was not responding to IV Lasix. She was seen by nephrology, temporary dialysis catheter was performed and she was dialyzed on 10/17 and 10/18.Katelyn Lane also complaining of abdominal pain, she had a paracentesis, study showed possible spontaneous peritonitis, she was treated with Rocephin and Flagyl.  Katelyn Lane had a worsening respiratory status on 10/17, was diagnosed with aspiration pneumonia. She was treated with Rocephin, Zithromax and Flagyl. Katelyn Lane also had abdominal pain and a CT scan suspect ischemia, but seen by general surgery on 10/19, no need for surgery.  Assessment & Plan:  Spontaneous bacterial peritonitis: Repeat paracentesis showed significant decrease of white cell in ascites, antibiotics effective. Hoever she still has a lot of neutrophils in 11/03/2019 fluid. Culture has been negative.Katelyn Lane received many days ofRocephin, sincediscontinued. Monitor. -Continue cipro for ppx   Acute on chronic combined systolic and diastolic congestive heart failure. Acute hypoxemic respiratory failure. She was dialyzed x2, but now getting diuresed. Katelyn Lane reported does not want to get dialysis again. - repeat CXR with improved edema 11/7 -nephrology c/s, appreciate recs (see 11/8 note) - torsemide 40 mg daily -On 3 L  of oxygen via nasal cannula.   Liver cirrhosis with ascites:Noted. GI consulted previously in hospitalization (see prior notes). -Continue cipro for SBP ppx  Hypotension: BP better this morning.  110/98.  Continue midodrine.    Acute renal failure on chronic kidney disease stage IIIb: baseline creatinine appears to be 1.66. Due to cardiorenal syndrome. Creatinine peaked at 4.16 -No plan for further dialysis per renal. Katelyn Lane reportedly does not want dialysis again -renal transitioned to torsemide 40 mg   Hyponatremia: Mild.  -Continue to monitor.  Left ventricular thrombus with history of CVA.Katelyn Lane was initially concerned for possible GI bleed, anticoagulation was on hold. However, there is no GI bleed observed while in the hospital. It is not safe to continue hold anticoagulation. Anticoagulation has now been restarted with eliquis.  -Monitor hemoglobin. -cont Eliquis  Overgrown toenails: will need outpatient podiatry follow up  Anemia of chronic disease:  -In the setting of CKD.  H&H is stable -cont iron supplement  Hypokalemia:Resolved.  Goals of care: appreciate palliative care involvement.  Complex situation overall.  She's now DNR with no plans for dialysis.  GOC are often difficult due to her orientation and limited insight at times, but she's been relatively consistent in her overall goals (no dialysis or heroic measures, consistent with her DNR).  Limited insight at times makes discharge planning more complex.  She was seen earlier by psych on 10/21 who noted that "she is mostly unable to make decisions for herself safely about her overall treatment". On 11/8, she attempted to leave against medical advice.  No safe DC plan was identified at that time.  Per Dr. Weber Cooks, she does not have capacity for safe decisions about living situation.  Overall a difficult situation, DSS working with [facility] team as well.  DVT prophylaxis: Eliquis Code Status:  DNR Family  Communication:  None present at bedside.  Plan of care discussed with Katelyn Lane in length and she verbalized understanding and agreed with it.  Disposition Plan: ?SNF  Consultants:  Palliative Nephrology GI  Procedures:  10/20 perma cath insertion perma cath removal 11/5  Antimicrobials:   As per chart  Status is: Inpatient  Dispo: The Katelyn Lane is from: home              Anticipated d/c is to: Pending              Anticipated d/c date is: >3 days              Katelyn Lane currently Not medically stable for the discharge  Subjective:  Katelyn Lane seen and examined.  Resting comfortably on the bed.  Appears pleasant.  Denies new complaints including chest pain or shortness of breath.  Objective: Vitals:   11/18/19 2328 11/19/19 0505 11/19/19 0801 11/19/19 1201  BP: 97/80 (!) 110/98 102/79 108/82  Pulse: 89 97 92 95  Resp: 17 17 17 17   Temp: 98.4 F (36.9 C) 97.7 F (36.5 C) 98.7 F (37.1 C) 98.1 F (36.7 C)  TempSrc: Oral Oral  Oral  SpO2: 100% 100% 100% 100%  Weight:      Height:        Intake/Output Summary (Last 24 hours) at 11/19/2019 1532 Last data filed at 11/18/2019 1900 Gross per 24 hour  Intake 240 ml  Output --  Net 240 ml   Filed Weights   11/13/19 0744 11/13/19 1226 11/14/19 0515  Weight: 101.9 kg 101.9 kg 104.1 kg    Examination:  General exam: Appears calm and comfortable, on nasal cannula, communicating well, appears very pleasant. Respiratory system: Clear to auscultation. Respiratory effort normal. Cardiovascular system: S1 & S2 heard, RRR. No JVD, murmurs, rubs, gallops or clicks. No pedal edema. Gastrointestinal system: Abdomen is nondistended, soft and nontender. No organomegaly or masses felt. Normal bowel sounds heard. Central nervous system: Alert and oriented. No focal neurological deficits. Extremities: Symmetric 5 x 5 power. Skin: No rashes, lesions or ulcers.   Data Reviewed: I have personally reviewed following labs and imaging  studies  CBC: Recent Labs  Lab 11/13/19 0436 11/14/19 0358 11/15/19 0527 11/16/19 0822  WBC 7.5 8.3 8.1 8.4  NEUTROABS  --  5.6 5.8 6.0  HGB 7.7* 8.3* 7.5* 7.7*  HCT 24.7* 25.7* 23.7* 24.0*  MCV 72.4* 71.2* 71.4* 71.0*  PLT 463* 526* 500* 553*   Basic Metabolic Panel: Recent Labs  Lab 11/13/19 0436 11/14/19 0358 11/15/19 0527 11/16/19 0822  NA 128* 128* 129* 128*  K 4.6 4.8 4.9 5.1  CL 92* 91* 94* 92*  CO2 25 24 23 25   GLUCOSE 91 102* 87 101*  BUN 51* 54* 60* 61*  CREATININE 3.25* 3.44* 3.73* 3.93*  CALCIUM 9.2 9.4 9.1 8.9  MG 1.8 1.7 1.7 1.7  PHOS  --  4.0 4.0 3.9   GFR: Estimated Creatinine Clearance: 17.9 mL/min (A) (by C-G formula based on SCr of 3.93 mg/dL (H)). Liver Function Tests: Recent Labs  Lab 11/13/19 0436 11/14/19 0358 11/15/19 0527 11/16/19 0822  AST 26 24 29 26   ALT 11 12 12 10   ALKPHOS 317* 328* 299* 252*  BILITOT 1.0 1.1 1.3* 1.0  PROT 5.9* 6.7 6.0* 5.6*  ALBUMIN 2.2* 2.4* 2.2* 2.3*   No results for input(s): LIPASE, AMYLASE in the last 168 hours. No results for input(s): AMMONIA in the last 168 hours. Coagulation Profile: Recent  Labs  Lab 11/14/19 0358 11/16/19 0822  INR 2.4* 3.2*   Cardiac Enzymes: No results for input(s): CKTOTAL, CKMB, CKMBINDEX, TROPONINI in the last 168 hours. BNP (last 3 results) No results for input(s): PROBNP in the last 8760 hours. HbA1C: No results for input(s): HGBA1C in the last 72 hours. CBG: No results for input(s): GLUCAP in the last 168 hours. Lipid Profile: No results for input(s): CHOL, HDL, LDLCALC, TRIG, CHOLHDL, LDLDIRECT in the last 72 hours. Thyroid Function Tests: No results for input(s): TSH, T4TOTAL, FREET4, T3FREE, THYROIDAB in the last 72 hours. Anemia Panel: No results for input(s): VITAMINB12, FOLATE, FERRITIN, TIBC, IRON, RETICCTPCT in the last 72 hours. Sepsis Labs: No results for input(s): PROCALCITON, LATICACIDVEN in the last 168 hours.  No results found for this or any  previous visit (from the past 240 hour(s)).    Radiology Studies: No results found.  Scheduled Meds: . apixaban  5 mg Oral BID  . Chlorhexidine Gluconate Cloth  6 each Topical Daily  . ciprofloxacin  500 mg Oral Q breakfast  . iron polysaccharides  150 mg Oral Daily  . mouth rinse  15 mL Mouth Rinse BID  . melatonin  2.5 mg Oral QHS  . midodrine  10 mg Oral TID WC  . multivitamin  1 tablet Oral QHS  . pantoprazole  40 mg Oral BID  . sodium chloride flush  3 mL Intravenous Q12H  . torsemide  40 mg Oral Daily  . traZODone  100 mg Oral QHS   Continuous Infusions: . sodium chloride Stopped (11/01/19 1131)     LOS: 31 days   Time spent: 40 minutes  Meryl Hubers Loann Quill, MD [facility]  If 7PM-7AM, please contact night-coverage [email or web address] 11/19/2019, 3:32 PM

## 2019-11-20 DIAGNOSIS — K652 Spontaneous bacterial peritonitis: Secondary | ICD-10-CM | POA: Diagnosis not present

## 2019-11-20 LAB — BASIC METABOLIC PANEL
Anion gap: 11 (ref 5–15)
BUN: 59 mg/dL — ABNORMAL HIGH (ref 8–23)
CO2: 25 mmol/L (ref 22–32)
Calcium: 8.8 mg/dL — ABNORMAL LOW (ref 8.9–10.3)
Chloride: 95 mmol/L — ABNORMAL LOW (ref 98–111)
Creatinine, Ser: 3.71 mg/dL — ABNORMAL HIGH (ref 0.44–1.00)
GFR, Estimated: 13 mL/min — ABNORMAL LOW (ref >60)
Glucose, Bld: 107 mg/dL — ABNORMAL HIGH (ref 70–99)
Potassium: 4.2 mmol/L (ref 3.5–5.1)
Sodium: 131 mmol/L — ABNORMAL LOW (ref 135–145)

## 2019-11-20 LAB — CBC
HCT: 23.2 % — ABNORMAL LOW (ref 36.0–46.0)
Hemoglobin: 7.4 g/dL — ABNORMAL LOW (ref 12.0–15.0)
MCH: 22.7 pg — ABNORMAL LOW (ref 26.0–34.0)
MCHC: 31.9 g/dL (ref 30.0–36.0)
MCV: 71.2 fL — ABNORMAL LOW (ref 80.0–100.0)
Platelets: 441 10*3/uL — ABNORMAL HIGH (ref 150–400)
RBC: 3.26 MIL/uL — ABNORMAL LOW (ref 3.87–5.11)
RDW: 20 % — ABNORMAL HIGH (ref 11.5–15.5)
WBC: 9.9 10*3/uL (ref 4.0–10.5)
nRBC: 0 % (ref 0.0–0.2)

## 2019-11-20 NOTE — Progress Notes (Signed)
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  After admission to the hospital, she was diagnosed with acute on chronic combinedsystolic and diastolic congestive heart failurewith ejection fraction 25 to 30%. She was not responding to IV Lasix. She was seen by nephrology, temporary dialysis catheter was performed and she was dialyzed on 10/17 and 10/18.Patient also complaining of abdominal pain, she had a paracentesis, study showed possible spontaneous peritonitis, she was treated with Rocephin and Flagyl.  Patient had a worsening respiratory status on 10/17, was diagnosed with aspiration pneumonia. She was treated with Rocephin, Zithromax and Flagyl. Patient also had abdominal pain and a CT scan suspect ischemia, but seen by general surgery on 10/19, no need for surgery.  Assessment & Plan:  Spontaneous bacterial peritonitis: Repeat paracentesis showed significant decrease of white cell in ascites, antibiotics effective. However she still has a lot of neutrophils in 11/03/2019 fluid. Culture has been negative.Pt received many days ofRocephin, sincediscontinued.  -Continue cipro for ppx   Acute on chronic combined systolic and diastolic congestive heart failure. Acute hypoxemic respiratory failure. She was dialyzed x2, but now getting diuresed. Pt reported does not want to get dialysis again. - repeat CXR with improved edema 11/7 -nephrology c/s, appreciate recs (see 11/8 note) - torsemide 40 mg daily -On 3 L of  oxygen via nasal cannula.   Liver cirrhosis with ascites:Noted. GI consulted previously in hospitalization (see prior notes). -Continue cipro for SBP ppx  Hypotension: Remained stable.  Continue midodrine.    Acute renal failure on chronic kidney disease stage IIIb: baseline creatinine appears to be 1.66. Due to cardiorenal syndrome. Creatinine peaked at 4.16 -Creatinine: 3.71, GFR: 13 -No plan for further dialysis per renal. Pt reportedly does not want dialysis again -renal transitioned to torsemide 40 mg-continue same.  Hyponatremia: Mild.  -Improved from 128-131.  Continue to monitor.  She is asymptomatic.  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 However, there is no GI bleed observed while in the hospital. It is not safe to continue hold anticoagulation. Anticoagulation has now been restarted with eliquis.  -Monitor hemoglobin. -cont Eliquis  Overgrown toenails: will need outpatient podiatry follow up  Anemia of chronic disease:  -In the setting of CKD.  H&H is stable between 7-8.  MCV: 71.2.  -cont iron supplement  Chronic thrombocytosis: Platelet: 441. Improving.  Hypokalemia:Resolved.  GERD: Continue PPI   Depression/insomnia: Continue trazodone  Goals of care: appreciate palliative care involvement.  Complex situation overall.  She's now DNR with no plans for dialysis.  GOC are often difficult due to her orientation and limited insight at times, but she's been relatively consistent in her overall goals (no dialysis or heroic measures, consistent with her DNR).  Limited insight at times makes discharge planning more complex.  She was seen earlier by psych on 10/21 who noted that "she is mostly unable to make decisions for herself safely about her overall treatment". On 11/8, she attempted to leave against medical advice.  No safe DC plan was identified at that time.   Per Dr.  Clapacs, she does not have capacity for safe decisions about living situation.  Overall a difficult situation, DSS working with [facility] team as well.  DVT prophylaxis: Eliquis Code Status: DNR Family Communication:  None present at bedside.  Plan of care discussed with patient in length and she verbalized understanding and agreed with it.  Disposition Plan: ?SNF  Consultants:  Palliative Nephrology GI  Procedures:  10/20 perma cath insertion perma cath removal 11/5  Antimicrobials:   As per chart  Status is: Inpatient  Dispo: The patient is from: home              Anticipated d/c is to: Pending              Anticipated d/c date is: >3 days              Patient currently Not medically stable for the discharge  Subjective:  Patient seen and examined.  Smiling and appears pleasant.  Tells me that she is doing fine, has no new complaints.  Wishes to go home.  Objective: Vitals:   11/19/19 2028 11/19/19 2354 11/20/19 0739 11/20/19 0801  BP: 93/77 104/80 105/86   Pulse: 89 78 90   Resp: 20 18 18    Temp: (!) 97.5 F (36.4 C) (!) 97.5 F (36.4 C) 97.8 F (36.6 C)   TempSrc:      SpO2: 100% 96% 100% 100%  Weight:      Height:        Intake/Output Summary (Last 24 hours) at 11/20/2019 1155 Last data filed at 11/20/2019 0300 Gross per 24 hour  Intake 480 ml  Output --  Net 480 ml   Filed Weights   11/13/19 0744 11/13/19 1226 11/14/19 0515  Weight: 101.9 kg 101.9 kg 104.1 kg    Examination:  General exam: Appears calm and comfortable, on nasal cannula, communicating well, not in distress. Respiratory system: Clear to auscultation. Respiratory effort normal. Cardiovascular system: S1 & S2 heard, RRR. No JVD, murmurs, rubs, gallops or clicks. No pedal edema. Gastrointestinal system: Abdomen is nondistended, soft and nontender. No organomegaly or masses felt. Normal bowel sounds heard. Central nervous system: Alert and oriented. No focal neurological  deficits. Extremities: Symmetric 5 x 5 power. Skin: No rashes, lesions or ulcers.  Data Reviewed: I have personally reviewed following labs and imaging studies  CBC: Recent Labs  Lab 11/14/19 0358 11/15/19 0527 11/16/19 0822 11/20/19 0652  WBC 8.3 8.1 8.4 9.9  NEUTROABS 5.6 5.8 6.0  --   HGB 8.3* 7.5* 7.7* 7.4*  HCT 25.7* 23.7* 24.0* 23.2*  MCV 71.2* 71.4* 71.0* 71.2*  PLT 526* 500* 522* 027*   Basic Metabolic Panel: Recent Labs  Lab 11/14/19 0358 11/15/19 0527 11/16/19 0822 11/20/19 0652  NA 128* 129* 128* 131*  K 4.8 4.9 5.1 4.2  CL 91* 94* 92* 95*  CO2 24 23 25 25   GLUCOSE 102* 87 101* 107*  BUN 54* 60* 61* 59*  CREATININE 3.44* 3.73* 3.93* 3.71*  CALCIUM 9.4 9.1 8.9 8.8*  MG 1.7 1.7 1.7  --   PHOS 4.0 4.0 3.9  --    GFR: Estimated Creatinine Clearance: 18.9 mL/min (A) (by C-G formula based on SCr of 3.71 mg/dL (H)). Liver Function Tests: Recent Labs  Lab 11/14/19 0358 11/15/19 0527 11/16/19 0822  AST 24 29 26   ALT 12 12 10   ALKPHOS 328* 299* 252*  BILITOT 1.1 1.3* 1.0  PROT 6.7 6.0* 5.6*  ALBUMIN 2.4* 2.2* 2.3*   No results  for input(s): LIPASE, AMYLASE in the last 168 hours. No results for input(s): AMMONIA in the last 168 hours. Coagulation Profile: Recent Labs  Lab 11/14/19 0358 11/16/19 0822  INR 2.4* 3.2*   Cardiac Enzymes: No results for input(s): CKTOTAL, CKMB, CKMBINDEX, TROPONINI in the last 168 hours. BNP (last 3 results) No results for input(s): PROBNP in the last 8760 hours. HbA1C: No results for input(s): HGBA1C in the last 72 hours. CBG: No results for input(s): GLUCAP in the last 168 hours. Lipid Profile: No results for input(s): CHOL, HDL, LDLCALC, TRIG, CHOLHDL, LDLDIRECT in the last 72 hours. Thyroid Function Tests: No results for input(s): TSH, T4TOTAL, FREET4, T3FREE, THYROIDAB in the last 72 hours. Anemia Panel: No results for input(s): VITAMINB12, FOLATE, FERRITIN, TIBC, IRON, RETICCTPCT in the last 72 hours. Sepsis  Labs: No results for input(s): PROCALCITON, LATICACIDVEN in the last 168 hours.  No results found for this or any previous visit (from the past 240 hour(s)).    Radiology Studies: No results found.  Scheduled Meds: . apixaban  5 mg Oral BID  . Chlorhexidine Gluconate Cloth  6 each Topical Daily  . ciprofloxacin  500 mg Oral Q breakfast  . iron polysaccharides  150 mg Oral Daily  . mouth rinse  15 mL Mouth Rinse BID  . melatonin  2.5 mg Oral QHS  . midodrine  10 mg Oral TID WC  . multivitamin  1 tablet Oral QHS  . pantoprazole  40 mg Oral BID  . sodium chloride flush  3 mL Intravenous Q12H  . torsemide  40 mg Oral Daily  . traZODone  100 mg Oral QHS   Continuous Infusions: . sodium chloride Stopped (11/01/19 1131)     LOS: 32 days   Time spent: 30 minutes  Ilani Otterson Loann Quill, MD [facility]  If 7PM-7AM, please contact night-coverage [email or web address] 11/20/2019, 11:55 AM

## 2019-11-20 NOTE — Plan of Care (Signed)
  Problem: Clinical Measurements: Goal: Ability to maintain clinical measurements within normal limits will improve Outcome: Not Progressing Goal: Diagnostic test results will improve Outcome: Not Progressing Goal: Respiratory complications will improve Outcome: Not Progressing

## 2019-11-20 NOTE — Progress Notes (Addendum)
Physical Therapy Treatment Patient Details Name: Katelyn Lane MRN: [id number] DOB: [birth year] Today's Date: 11/20/2019    History of Present Illness Pt is a 63 yo female who presented to ER secondary to LE edema, progressive SOB; admitted for management of acute/chronic CHF, spontaneous bacterial peritonitis (s/p paracentesis 10/25).  Hospital course also significant for initiation of dialysis (initially, R temp fem cath; converted to R permcath).  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    PT Comments    Pt willing to participate with PT services but was very slow to progress from supine to sitting at the EOB with many short therapeutic rest breaks needed.  Upon sitting pt somewhat impulsive with movements leading her to scoot very close to EOB requiring +2 assist to ensure pt did not come off the edge of the bed.  Pt was able to stand from there without actual physical assistance and then ambulate around 80 feet with variable cadence and poor safety awareness including amb too far behind the RW.  Pt session ended secondary to pt stating she had to toilet and was left with nursing on the [facility].  Goals remain appropriate at this time. Pt will benefit from a trial of PT services in a SNF setting upon discharge to safely address deficits listed in patient problem list for decreased caregiver assistance and eventual return to PLOF.   Follow Up Recommendations  SNF;Supervision/Assistance - 24 hour     Equipment Recommendations  Rolling walker with 5" wheels;3in1 (PT)    Recommendations for Other Services        Precautions / Restrictions Precautions Precautions: Fall Restrictions Weight Bearing Restrictions: No Other Position/Activity Restrictions: Impulsive, self directs care, very easily agitated, careful    Mobility  Bed Mobility Overal bed mobility: Needs Assistance       Supine to sit: Supervision     General bed mobility comments: Extra time and effort and use of bed rail required  Transfers Overall transfer level: Needs assistance Equipment used: Rolling walker (2 wheeled);None Transfers: Sit to/from Stand Sit to Stand: Min guard;Supervision         General transfer comment: Poor safety awareness with pt sliding close to the EOB prior to standing; fair eccentric and concentric control  Ambulation/Gait Ambulation/Gait assistance: Min guard;Supervision Gait Distance (Feet): 80 Feet Assistive device: Rolling walker (2 wheeled) Gait Pattern/deviations: Step-through pattern;Decreased step length - right;Decreased step length - left;Trunk flexed Gait velocity: impulsive with inconsistent gait speed   General Gait Details: Poor safety awareness with amb too far behind the RW but was steady without LOB; amb ended abruptly secondary to pt stating needing to toilet   Stairs             Wheelchair Mobility    Modified Rankin (Stroke Patients Only)       Balance Overall balance assessment: Needs assistance Sitting-balance support: Feet supported;No upper extremity supported Sitting balance-Leahy Scale: Good     Standing balance support: Bilateral upper extremity supported;During functional activity Standing balance-Leahy Scale: Fair                              Cognition Arousal/Alertness: Awake/alert Behavior During Therapy: Agitated;Impulsive Overall  Cognitive Status: Within Functional Limits for tasks assessed                                        Exercises Other Exercises Other Exercises: Pt education provided on  physiological benefits of activity    General Comments        Pertinent Vitals/Pain Pain Assessment: No/denies pain    Home Living                      Prior Function            PT Goals (current goals can now be found in the care plan section) Progress towards PT goals: Not progressing toward goals - comment (Impulsive with frequent refusals to participate with PT)    Frequency    Min 2X/week      PT Plan Current plan remains appropriate    Co-evaluation              AM-PAC PT "6 Clicks" Mobility   Outcome Measure  Help needed turning from your back to your side while in a flat bed without using bedrails?: A Little Help needed moving from lying on your back to sitting on the side of a flat bed without using bedrails?: A Little Help needed moving to and from a bed to a chair (including a wheelchair)?: A Little Help needed standing up from a chair using your arms (e.g., wheelchair or bedside chair)?: A Little Help needed to walk in hospital room?: A Little Help needed climbing 3-5 steps with a railing? : A Little 6 Click Score: 18    End of Session Equipment Utilized During Treatment: Gait belt;Oxygen Activity Tolerance: Patient tolerated treatment well Patient left: with nursing/sitter in room;Other (comment) (Pt left with nurse on [facility]) Nurse Communication: Mobility status PT Visit Diagnosis: Muscle weakness (generalized) (M62.81);Difficulty in walking, not elsewhere classified (R26.2)     Time: 1007-1219 PT Time Calculation (min) (ACUTE ONLY): 24 min  Charges:  $Gait Training: 8-22 mins $Therapeutic Activity: 8-22 mins                     D. Scott Rameen Gohlke PT, DPT 11/20/19, 3:57 PM

## 2019-11-20 NOTE — Progress Notes (Signed)
Pt anxious and upset at this time. She states she has not refused dialysis, she was only on it temporarily. She states she does not take trazodone and didn't want it. She thinks one of meds is giving her stomach pain and doesn't know which one. Pt only took Eliquis this pm.

## 2019-11-21 DIAGNOSIS — K652 Spontaneous bacterial peritonitis: Secondary | ICD-10-CM | POA: Diagnosis not present

## 2019-11-21 NOTE — TOC Progression Note (Signed)
Transition of Care [facility]) - Progression Note    Patient Details  Name: Katelyn Lane MRN: [id number] Date of Birth: [birth year]  [facility]) CM/SW Contact  Izola Price, RN Phone Number: 11/21/2019, 2:48 PM  Clinical Narrative:     11/13 Provider requested to go with family decision while the legalities and DSS go on in background/meantime. However family does not want to make decisions per provider. Will continue to monitor for word from DSS. Simmie Davies RN CM         Expected Discharge Plan and Services                                                 Social Determinants of Health (SDOH) Interventions    Readmission Risk Interventions Readmission Risk Prevention Plan 09/02/2019 08/14/2019 07/27/2019  Transportation Screening Complete Complete Complete  Medication Review [employer]) Complete Referral to Pharmacy -  PCP or Specialist appointment within 3-5 days of discharge Complete Complete Complete  HRI or Home Care Consult - Complete Complete  SW Recovery Care/Counseling Consult - Complete Complete  Palliative Care Screening Not Applicable Not Applicable Not Applicable  Skilled Nursing Facility Complete Not Applicable Not Applicable  Some recent data might be hidden

## 2019-11-21 NOTE — Progress Notes (Signed)
PROGRESS NOTE    Katelyn Lane  ZCH:[id number] DOB: [birth year] DOA: 10/19/2019 PCP: [name], MD   Brief Narrative:   Katelyn Lane a 64 y.o.femalewith medical history significant forcombined diastolic and systolic heart failure, CKD stage IIIb, history of CVA, hypertension, hyperlipidemia, ventricular thrombus on Xarelto and cocaine use who presents with concerns of lower extremity edema and increasing shortness of breath.Found to have BNP greater than 4500, elevated troponin and AKI.  After admission to the hospital, she was diagnosed with acute on chronic combinedsystolic and diastolic congestive heart failurewith ejection fraction 25 to 30%. She was not responding to IV Lasix. She was seen by nephrology, temporary dialysis catheter was performed and she was dialyzed on 10/17 and 10/18.Patient also complaining of abdominal pain, she had a paracentesis, study showed possible spontaneous peritonitis, she was treated with Rocephin and Flagyl.  Patient had a worsening respiratory status on 10/17, was diagnosed with aspiration pneumonia. She was treated with Rocephin, Zithromax and Flagyl. Patient also had abdominal pain and a CT scan suspect ischemia, but seen by general surgery on 10/19, no need for surgery.  Assessment & Plan:  Spontaneous bacterial peritonitis: Repeat paracentesis showed significant decrease of white cell in ascites, antibiotics effective. However she still has a lot of neutrophils in 11/03/2019 fluid. Culture has been negative.Katelyn Lane received many days ofRocephin, sincediscontinued.  -Continue cipro for ppx   Acute on chronic combined systolic and diastolic congestive heart failure. Acute hypoxemic respiratory failure. She was dialyzed x2, but now getting diuresed. Katelyn Lane reported does not want to get dialysis again. - repeat CXR with improved edema 11/7 -nephrology c/s, appreciate recs (see 11/8 note) - torsemide 40 mg daily -On 3 L of  oxygen via nasal cannula.   Liver cirrhosis with ascites:Noted. GI consulted previously in hospitalization (see prior notes). -Continue cipro for SBP ppx  Hypotension: Remained stable.  Continue midodrine.    Acute renal failure on chronic kidney disease stage IIIb: baseline creatinine appears to be 1.66. Due to cardiorenal syndrome. Creatinine peaked at 4.16 -Creatinine: 3.71, GFR: 13 -No plan for further dialysis per renal. Katelyn Lane reportedly does not want dialysis again -renal transitioned to torsemide 40 mg-continue same.  Hyponatremia: Mild.  -Improved from 128-131.  Continue to monitor.  She is asymptomatic.  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 However, there is no GI bleed observed while in the hospital. It is not safe to continue hold anticoagulation. Anticoagulation has now been restarted with eliquis.  -Monitor hemoglobin. -cont Eliquis  Overgrown toenails: will need outpatient podiatry follow up  Anemia of chronic disease:  -In the setting of CKD.  H&H is stable between 7-8.  MCV: 71.2.  -cont iron supplement  Chronic thrombocytosis: Platelet: 441. Improving.  Hypokalemia:Resolved.  GERD: Continue PPI   Depression/insomnia: Continue trazodone  Goals of care: appreciate palliative care involvement.  Complex situation overall.  She's now DNR with no plans for dialysis.  GOC are often difficult due to her orientation and limited insight at times, but she's been relatively consistent in her overall goals (no dialysis or heroic measures, consistent with her DNR).  Limited insight at times makes discharge planning more complex.  She was seen earlier by psych on 10/21 who noted that "she is mostly unable to make decisions for herself safely about her overall treatment". On 11/8, she attempted to leave against medical advice.  No safe DC plan was identified at that time.   Per Dr.  Clapacs, she does not have capacity for safe decisions about living situation.  Overall a difficult situation, DSS working with [facility] team as well.  DVT prophylaxis: Eliquis Code Status: DNR Family Communication:  None present at bedside.  Plan of care discussed with patient in length and she verbalized understanding and agreed with it.  Disposition Plan: ?SNF  Consultants:  Palliative Nephrology GI  Procedures:  10/20 perma cath insertion perma cath removal 11/5  Antimicrobials:   As per chart  Status is: Inpatient  Dispo: The patient is from: home              Anticipated d/c is to: Pending              Anticipated d/c date is: >3 days              Patient currently Not medically stable for the discharge  Subjective:  Patient seen and examined.  Resting comfortably on the bed.  Tells me that she is fine and has no new complaints.  Denies chest pain, shortness of breath, nausea, vomiting, urinary symptoms.  Objective: Vitals:   11/20/19 1952 11/20/19 2337 11/21/19 0500 11/21/19 0820  BP: 99/84 108/83 96/79 94/83   Pulse: 87 79 71 73  Resp: 18 18 17 16   Temp: 98.1 F (36.7 C) 97.6 F (36.4 C) 98.4 F (36.9 C) 97.6 F (36.4 C)  TempSrc: Oral Oral Oral Oral  SpO2: 100% 100% 94% 100%  Weight:      Height:       No intake or output data in the 24 hours ending 11/21/19 1009 Filed Weights   11/13/19 0744 11/13/19 1226 11/14/19 0515  Weight: 101.9 kg 101.9 kg 104.1 kg    Examination:  General exam: Appears calm and comfortable, on 3 L of oxygen via nasal cannula, communicating well,  pleasant Respiratory system: Clear to auscultation. Respiratory effort normal. Cardiovascular system: S1 & S2 heard, RRR. No JVD, murmurs, rubs, gallops or clicks. No pedal edema. Gastrointestinal system: Abdomen is nondistended, soft and nontender. No organomegaly or masses felt. Normal bowel sounds heard. Central nervous system: Alert and oriented. No focal neurological  deficits. Extremities: Symmetric 5 x 5 power. Skin: No rashes, lesions or ulcers.  Data Reviewed: I have personally reviewed following labs and imaging studies  CBC: Recent Labs  Lab 11/15/19 0527 11/16/19 0822 11/20/19 0652  WBC 8.1 8.4 9.9  NEUTROABS 5.8 6.0  --   HGB 7.5* 7.7* 7.4*  HCT 23.7* 24.0* 23.2*  MCV 71.4* 71.0* 71.2*  PLT 500* 522* 811*   Basic Metabolic Panel: Recent Labs  Lab 11/15/19 0527 11/16/19 0822 11/20/19 0652  NA 129* 128* 131*  K 4.9 5.1 4.2  CL 94* 92* 95*  CO2 23 25 25   GLUCOSE 87 101* 107*  BUN 60* 61* 59*  CREATININE 3.73* 3.93* 3.71*  CALCIUM 9.1 8.9 8.8*  MG 1.7 1.7  --   PHOS 4.0 3.9  --    GFR: Estimated Creatinine Clearance: 18.9 mL/min (A) (by C-G formula based on SCr of 3.71 mg/dL (H)). Liver Function Tests: Recent Labs  Lab 11/15/19 0527 11/16/19 0822  AST 29 26  ALT 12 10  ALKPHOS 299* 252*  BILITOT 1.3* 1.0  PROT 6.0* 5.6*  ALBUMIN 2.2* 2.3*   No results for input(s): LIPASE, AMYLASE in the last 168 hours. No results for input(s): AMMONIA in the last 168 hours. Coagulation Profile: Recent Labs  Lab 11/16/19 0822  INR 3.2*   Cardiac Enzymes: No results  for input(s): CKTOTAL, CKMB, CKMBINDEX, TROPONINI in the last 168 hours. BNP (last 3 results) No results for input(s): PROBNP in the last 8760 hours. HbA1C: No results for input(s): HGBA1C in the last 72 hours. CBG: No results for input(s): GLUCAP in the last 168 hours. Lipid Profile: No results for input(s): CHOL, HDL, LDLCALC, TRIG, CHOLHDL, LDLDIRECT in the last 72 hours. Thyroid Function Tests: No results for input(s): TSH, T4TOTAL, FREET4, T3FREE, THYROIDAB in the last 72 hours. Anemia Panel: No results for input(s): VITAMINB12, FOLATE, FERRITIN, TIBC, IRON, RETICCTPCT in the last 72 hours. Sepsis Labs: No results for input(s): PROCALCITON, LATICACIDVEN in the last 168 hours.  No results found for this or any previous visit (from the past 240 hour(s)).     Radiology Studies: No results found.  Scheduled Meds: . apixaban  5 mg Oral BID  . ciprofloxacin  500 mg Oral Q breakfast  . iron polysaccharides  150 mg Oral Daily  . mouth rinse  15 mL Mouth Rinse BID  . melatonin  2.5 mg Oral QHS  . midodrine  10 mg Oral TID WC  . multivitamin  1 tablet Oral QHS  . pantoprazole  40 mg Oral BID  . sodium chloride flush  3 mL Intravenous Q12H  . torsemide  40 mg Oral Daily  . traZODone  100 mg Oral QHS   Continuous Infusions: . sodium chloride Stopped (11/01/19 1131)     LOS: 33 days   Time spent: 30 minutes  Areon Cocuzza Loann Quill, MD [facility]  If 7PM-7AM, please contact night-coverage [email or web address] 11/21/2019, 10:09 AM

## 2019-11-21 NOTE — Progress Notes (Signed)
[facility]  ROUNDING NOTE   Subjective:   Follow-up visit conducted today. Renal function shows creatinine of 3.71 with an EGFR of 13.    Objective:  Vital signs in last 24 hours:  Temp:  [97.6 F (36.4 C)-98.4 F (36.9 C)] 97.7 F (36.5 C) (11/13 1209) Pulse Rate:  [71-87] 72 (11/13 1209) Resp:  [16-19] 18 (11/13 1209) BP: (94-108)/(73-84) 97/73 (11/13 1209) SpO2:  [94 %-100 %] 100 % (11/13 1209)  Weight change:  Filed Weights   11/13/19 0744 11/13/19 1226 11/14/19 0515  Weight: 101.9 kg 101.9 kg 104.1 kg    Intake/Output: I/O last 3 completed shifts: In: 480 [P.O.:480] Out: -    Intake/Output this shift:  Total I/O In: 120 [P.O.:120] Out: -   Physical Exam: General:  Sitting up in bed, in no acute distress  Head  normocephalic, atraumatic  Lungs:   Lungs clear, diminished at the bases  Heart:  Regular rate and rhythm  Abdomen:   Non distended, nontender  Extremities:  2+peripheral edema  Neurologic:  Awake, alert, follows commands  Skin: No acute lesions or rashes    Basic Metabolic Panel: Recent Labs  Lab 11/15/19 0527 11/16/19 0822 11/20/19 0652  NA 129* 128* 131*  K 4.9 5.1 4.2  CL 94* 92* 95*  CO2 $Re'23 25 25  'OIP$ GLUCOSE 87 101* 107*  BUN 60* 61* 59*  CREATININE 3.73* 3.93* 3.71*  CALCIUM 9.1 8.9 8.8*  MG 1.7 1.7  --   PHOS 4.0 3.9  --     Liver Function Tests: Recent Labs  Lab 11/15/19 0527 11/16/19 0822  AST 29 26  ALT 12 10  ALKPHOS 299* 252*  BILITOT 1.3* 1.0  PROT 6.0* 5.6*  ALBUMIN 2.2* 2.3*   No results for input(s): LIPASE, AMYLASE in the last 168 hours. No results for input(s): AMMONIA in the last 168 hours.  CBC: Recent Labs  Lab 11/15/19 0527 11/16/19 0822 11/20/19 0652  WBC 8.1 8.4 9.9  NEUTROABS 5.8 6.0  --   HGB 7.5* 7.7* 7.4*  HCT 23.7* 24.0* 23.2*  MCV 71.4* 71.0* 71.2*  PLT 500* 522* 441*    Cardiac Enzymes: No results for input(s): CKTOTAL, CKMB, CKMBINDEX, TROPONINI in the last 168  hours.  BNP: Invalid input(s): POCBNP  CBG: No results for input(s): GLUCAP in the last 168 hours.  Microbiology: Results for orders placed or performed during the hospital encounter of 10/19/19  Respiratory Panel by RT PCR (Flu A&B, Covid) - Nasopharyngeal Swab     Status: None   Collection Time: 10/19/19 11:13 PM   Specimen: Nasopharyngeal Swab  Result Value Ref Range Status   SARS Coronavirus 2 by RT PCR NEGATIVE NEGATIVE Final    Comment: (NOTE) SARS-CoV-2 target nucleic acids are NOT DETECTED.  The SARS-CoV-2 RNA is generally detectable in upper respiratoy specimens during the acute phase of infection. The lowest concentration of SARS-CoV-2 viral copies this assay can detect is 131 copies/mL. A negative result does not preclude SARS-Cov-2 infection and should not be used as the sole basis for treatment or other patient management decisions. A negative result may occur with  improper specimen collection/handling, submission of specimen other than nasopharyngeal swab, presence of viral mutation(s) within the areas targeted by this assay, and inadequate number of viral copies (<131 copies/mL). A negative result must be combined with clinical observations, patient history, and epidemiological information. The expected result is Negative.  Fact Sheet for Patients:  PinkCheek.be  Fact Sheet for Healthcare Providers:  GravelBags.it  This test is no t yet approved or cleared by the [place] and  has been authorized for detection and/or diagnosis of SARS-CoV-2 by FDA under an Emergency Use Authorization (EUA). This EUA will remain  in effect (meaning this test can be used) for the duration of the COVID-19 declaration under Section 564(b)(1) of the Act, 21 U.S.C. section 360bbb-3(b)(1), unless the authorization is terminated or revoked sooner.     Influenza A by PCR NEGATIVE NEGATIVE Final   Influenza B by PCR  NEGATIVE NEGATIVE Final    Comment: (NOTE) The Xpert Xpress SARS-CoV-2/FLU/RSV assay is intended as an aid in  the diagnosis of influenza from Nasopharyngeal swab specimens and  should not be used as a sole basis for treatment. Nasal washings and  aspirates are unacceptable for Xpert Xpress SARS-CoV-2/FLU/RSV  testing.  Fact Sheet for Patients: PinkCheek.be  Fact Sheet for Healthcare Providers: GravelBags.it  This test is not yet approved or cleared by the [place] FDA and  has been authorized for detection and/or diagnosis of SARS-CoV-2 by  FDA under an Emergency Use Authorization (EUA). This EUA will remain  in effect (meaning this test can be used) for the duration of the  Covid-19 declaration under Section 564(b)(1) of the Act, 21  U.S.C. section 360bbb-3(b)(1), unless the authorization is  terminated or revoked. Performed at [facility], [address] [place], [place] [address]   MRSA PCR Screening     Status: None   Collection Time: 10/25/19  2:50 PM   Specimen: Nasopharyngeal  Result Value Ref Range Status   MRSA by PCR NEGATIVE NEGATIVE Final    Comment:        The GeneXpert MRSA Assay (FDA approved for NASAL specimens only), is one component of a comprehensive MRSA colonization surveillance program. It is not intended to diagnose MRSA infection nor to guide or monitor treatment for MRSA infections. Performed at [facility], [address] [place], [place] [address]   CULTURE, BLOOD (ROUTINE X 2) w Reflex to ID Panel     Status: None   Collection Time: 10/25/19  4:29 PM   Specimen: BLOOD  Result Value Ref Range Status   Specimen Description BLOOD CENTRAL LINE  Final   Special Requests   Final    BOTTLES DRAWN AEROBIC AND ANAEROBIC Blood Culture adequate volume   Culture   Final    NO GROWTH 5 DAYS Performed at [facility], [address] [place], [place]  [address]    Report Status 10/30/2019 FINAL  Final  CULTURE, BLOOD (ROUTINE X 2) w Reflex to ID Panel     Status: None   Collection Time: 10/26/19  6:45 AM   Specimen: BLOOD  Result Value Ref Range Status   Specimen Description BLOOD BLOOD LEFT HAND  Final   Special Requests   Final    BOTTLES DRAWN AEROBIC AND ANAEROBIC Blood Culture adequate volume   Culture   Final    NO GROWTH 5 DAYS Performed at [facility], [address], [place], [place] [address]    Report Status 10/31/2019 FINAL  Final  Body fluid culture     Status: None   Collection Time: 10/26/19 11:30 AM   Specimen: Fluid  Result Value Ref Range Status   Specimen Description   Final    FLUID Performed at [facility], [address] [place], [place] [address]    Special Requests   Final    PERITONEAL Performed  at [facility], [address] [place], [place] [address]    Gram Stain   Final    ABUNDANT WBC PRESENT, PREDOMINANTLY PMN NO ORGANISMS SEEN    Culture   Final    NO GROWTH Performed at [facility], [address] [place], [place] [address]    Report Status 10/30/2019 FINAL  Final  Body fluid culture     Status: None   Collection Time: 10/29/19  1:39 PM   Specimen: PATH Cytology Peritoneal fluid  Result Value Ref Range Status   Specimen Description   Final    PERITONEAL Performed at [facility], [address], [place], [place] [address]    Special Requests   Final    NONE Performed at [facility], [address] [place], [place] [address]    Gram Stain   Final    MODERATE WBC PRESENT, PREDOMINANTLY PMN NO ORGANISMS SEEN    Culture   Final    NO GROWTH 3 DAYS Performed at [facility], [place] [address], [place], [place] [address]    Report Status 11/02/2019 FINAL  Final  Gram stain     Status: None   Collection Time: 11/02/19  3:30 PM   Specimen: PATH Cytology Peritoneal fluid  Result Value Ref Range Status   Specimen  Description PERITONEAL  Final   Special Requests NONE  Final   Gram Stain   Final    WBC SEEN RED BLOOD CELLS NO ORGANISMS SEEN Performed at [facility], [address], [place], [place] [address]    Report Status 11/02/2019 FINAL  Final    Coagulation Studies: No results for input(s): LABPROT, INR in the last 72 hours.  Urinalysis: No results for input(s): COLORURINE, LABSPEC, PHURINE, GLUCOSEU, HGBUR, BILIRUBINUR, KETONESUR, PROTEINUR, UROBILINOGEN, NITRITE, LEUKOCYTESUR in the last 72 hours.  Invalid input(s): APPERANCEUR    Imaging: No results found.   Medications:   . sodium chloride Stopped (11/01/19 1131)   . apixaban  5 mg Oral BID  . ciprofloxacin  500 mg Oral Q breakfast  . iron polysaccharides  150 mg Oral Daily  . mouth rinse  15 mL Mouth Rinse BID  . melatonin  2.5 mg Oral QHS  . midodrine  10 mg Oral TID WC  . multivitamin  1 tablet Oral QHS  . pantoprazole  40 mg Oral BID  . sodium chloride flush  3 mL Intravenous Q12H  . torsemide  40 mg Oral Daily  . traZODone  100 mg Oral QHS   sodium chloride, acetaminophen, albuterol, alum & mag hydroxide-simeth **AND** lidocaine, bisacodyl, calcium carbonate, hydrOXYzine, loperamide, ondansetron (ZOFRAN) IV, oxyCODONE-acetaminophen, sodium chloride flush  Assessment/ Plan:  [name] is a 64 y.o. black female with congestive heart failure, hypertension, hyperlipidemia, gout, COPD, anemia, cocaine abuse who is admitted to [facility] on 10/19/2019 for Peripheral edema [R60.9] Acute CHF (congestive heart failure) ([place]) [I50.9] AKI (acute kidney injury) ([place]) [N17.9] Congestive heart failure, unspecified HF chronicity, unspecified heart failure type ([place]) [A41.6] Acute diastolic (congestive) heart failure ([place]) [I50.31]  1. Acute kidney injury on chronic kidney disease stage IIIB.  Patient continues to have significant renal dysfunction.  Challenging social situation.  No further dialysis pursued per  patient desire.   Lab Results  Component Value Date   CREATININE 3.71 (H) 11/20/2019   CREATININE 3.93 (H) 11/16/2019   CREATININE 3.73 (H) 11/15/2019    2. Hypotension with acute exacerbation of systolic and diastolic congestive heart  failure. Echocardiogram 06/22/19 EF of 25-30%. Continue midodrine 10 mg 3 times daily.  3. Anemia of Chronic Disease Hemoglobin 7.4.  No immediate need for blood transfusion.    LOS: 33 Doren Kaspar 11/13/20212:35 PM    Patient was seen and examined with Crosby Oyster, DNP.  Above plan was discussed and agreed upon on the signing of this note.   Saud Bail Holley Raring, [facility]  11/13/20212:35 PM

## 2019-11-22 DIAGNOSIS — K652 Spontaneous bacterial peritonitis: Secondary | ICD-10-CM | POA: Diagnosis not present

## 2019-11-22 MED ORDER — KETOCONAZOLE 2 % EX CREA
TOPICAL_CREAM | Freq: Two times a day (BID) | CUTANEOUS | Status: DC
  Filled 2019-11-22: qty 15

## 2019-11-22 NOTE — Consult Note (Signed)
[facility] Consult   Reason for Consult:   Follow-up brief note for this 64 year old woman in the hospital recovering from multiple medical problems.  Had been following patient because of confusion and questions about capacity Referring Physician:  Pahwani Patient Identification: Katelyn Lane MRN:  [id number] Principal Diagnosis: Spontaneous bacterial peritonitis ([place]) Diagnosis:  Principal Problem:   Spontaneous bacterial peritonitis ([place]) Active Problems:   AKI (acute kidney injury) ([place])   Crack cocaine use   Acute respiratory failure with hypoxia (HCC)   LV (left ventricular) mural thrombus   Chronic kidney disease (CKD) stage G3b/A1, moderately decreased glomerular filtration rate (GFR) between 30-44 mL/min/1.73 square meter and albuminuria creatinine ratio less than 30 mg/g (HCC)   Nonsustained ventricular tachycardia (HCC)   Acute CHF (congestive heart failure) (HCC)   Cocaine abuse ([place])   Peripheral edema   Cirrhosis of liver (HCC)   Dementia (HCC)   Aspiration pneumonia of both lower lobes due to gastric secretions (HCC)   Acute diastolic (congestive) heart failure (HCC)   Cardiorenal disease   Goals of care, counseling/discussion   Palliative care by specialist   Ascites due to alcoholic cirrhosis ([place])   Total Time spent with patient: 20 minutes  Subjective:   Katelyn Lane is a 64 y.o. female patient admitted with "I am doing okay".  HPI: See previous note.  Visited patient today around lunchtime.  Patient was awake and alert.  Recognized me.  Appropriate conversation.  No specific complaints.  Denied any hallucinations.  Denied specific mood symptoms.  Said that she was reconciled to working with the staff about placement.  Past Psychiatric History: History of behavior problems and substance abuse in the past  Risk to Self:   Risk to Others:   Prior Inpatient Therapy:   Prior Outpatient Therapy:    Past Medical History:  Past Medical  History:  Diagnosis Date  . CHF (congestive heart failure) ([place])   . COVID-19   . Hyperlipidemia   . Hypertension   . Renal disorder     Past Surgical History:  Procedure Laterality Date  . DIALYSIS/PERMA CATHETER INSERTION N/A 10/28/2019   Procedure: DIALYSIS/PERMA CATHETER INSERTION;  Surgeon: Katha Cabal, MD;  Location: [facility];  Service: Cardiovascular;  Laterality: N/A;  . DIALYSIS/PERMA CATHETER REMOVAL N/A 11/13/2019   Procedure: DIALYSIS/PERMA CATHETER REMOVAL;  Surgeon: Algernon Huxley, MD;  Location: [facility];  Service: Cardiovascular;  Laterality: N/A;  . RIGHT/LEFT HEART CATH AND CORONARY ANGIOGRAPHY N/A 12/30/2018   Procedure: RIGHT/LEFT HEART CATH AND CORONARY ANGIOGRAPHY;  Surgeon: Corey Skains, MD;  Location: [facility];  Service: Cardiovascular;  Laterality: N/A;   Family History:  Family History  Problem Relation Age of Onset  . Breast cancer Neg Hx    Family Psychiatric  History: See previous Social History:  Social History   Substance and Sexual Activity  Alcohol Use No  . Alcohol/week: 0.0 standard drinks     Social History   Substance and Sexual Activity  Drug Use Yes  . Types: Cocaine   Comment: 3-4 days ago    Social History   Socioeconomic History  . Marital status: Single    Spouse name: Not on file  . Number of children: Not on file  . Years of education: Not on file  . Highest education level: Not on file  Occupational History  . Not on file  Tobacco Use  . Smoking status: Current Every Day Smoker  . Smokeless  tobacco: Never Used  Substance and Sexual Activity  . Alcohol use: No    Alcohol/week: 0.0 standard drinks  . Drug use: Yes    Types: Cocaine    Comment: 3-4 days ago  . Sexual activity: Not on file  Other Topics Concern  . Not on file  Social History Narrative  . Not on file   Social Determinants of Health   Financial Resource Strain:   . Difficulty of Paying Living Expenses:  Not on file  Food Insecurity:   . Worried About Charity fundraiser in the Last Year: Not on file  . Ran Out of Food in the Last Year: Not on file  Transportation Needs:   . Lack of Transportation (Medical): Not on file  . Lack of Transportation (Non-Medical): Not on file  Physical Activity:   . Days of Exercise per Week: Not on file  . Minutes of Exercise per Session: Not on file  Stress:   . Feeling of Stress : Not on file  Social Connections:   . Frequency of Communication with Friends and Family: Not on file  . Frequency of Social Gatherings with Friends and Family: Not on file  . Attends Religious Services: Not on file  . Active Member of Clubs or Organizations: Not on file  . Attends Archivist Meetings: Not on file  . Marital Status: Not on file   Additional Social History:    Allergies:   Allergies  Allergen Reactions  . Penicillins Hives, Rash and Other (See Comments)    Did it involve swelling of the face/tongue/throat, SOB, or low BP? No Did it involve sudden or severe rash/hives, skin peeling, or any reaction on the inside of your mouth or nose? Yes Did you need to seek medical attention at a hospital or doctor's office? Yes When did it last happen? >25 years If all above answers are "NO", may proceed with cephalosporin use.   . Ace Inhibitors Nausea And Vomiting    Labs: No results found for this or any previous visit (from the past 48 hour(s)).  Current Facility-Administered Medications  Medication Dose Route Frequency Provider Last Rate Last Admin  . 0.9 %  sodium chloride infusion  250 mL Intravenous PRN Schnier, Dolores Lory, MD   Stopped at 11/01/19 1131  . acetaminophen (TYLENOL) tablet 500 mg  500 mg Oral Q6H PRN Elodia Florence., MD   500 mg at 11/21/19 0102  . albuterol (PROVENTIL) (2.5 MG/3ML) 0.083% nebulizer solution 2.5 mg  2.5 mg Nebulization Q4H PRN Sharen Hones, MD   2.5 mg at 11/20/19 0801  . alum & mag hydroxide-simeth  (MAALOX/MYLANTA) 200-200-20 MG/5ML suspension 30 mL  30 mL Oral Q4H PRN Schnier, Dolores Lory, MD   30 mL at 10/23/19 2205   And  . lidocaine (XYLOCAINE) 2 % viscous mouth solution 15 mL  15 mL Oral Q4H PRN Schnier, Dolores Lory, MD   15 mL at 10/23/19 2205  . apixaban (ELIQUIS) tablet 5 mg  5 mg Oral BID Sharen Hones, MD   5 mg at 11/22/19 1249  . bisacodyl (DULCOLAX) suppository 10 mg  10 mg Rectal Daily PRN Schnier, Dolores Lory, MD   10 mg at 10/24/19 0459  . calcium carbonate (TUMS - dosed in mg elemental calcium) chewable tablet 200 mg of elemental calcium  1 tablet Oral TID PRN Sharen Hones, MD   200 mg of elemental calcium at 11/01/19 0949  . ciprofloxacin (CIPRO) tablet 500 mg  500  mg Oral Q breakfast Elodia Florence., MD   500 mg at 11/22/19 0804  . hydrOXYzine (ATARAX/VISTARIL) tablet 50 mg  50 mg Oral Q6H PRN Rai Sinagra, Madie Reno, MD   50 mg at 11/21/19 0102  . iron polysaccharides (NIFEREX) capsule 150 mg  150 mg Oral Daily Sharen Hones, MD   150 mg at 11/21/19 1245  . ketoconazole (NIZORAL) 2 % cream   Topical BID Mckinley Jewel, MD   Given at 11/22/19 1250  . loperamide (IMODIUM) capsule 2 mg  2 mg Oral PRN Sharen Hones, MD   2 mg at 11/09/19 0853  . MEDLINE mouth rinse  15 mL Mouth Rinse BID Schnier, Dolores Lory, MD   15 mL at 11/22/19 0930  . melatonin tablet 2.5 mg  2.5 mg Oral QHS Sharen Hones, MD   2.5 mg at 11/21/19 2220  . midodrine (PROAMATINE) tablet 10 mg  10 mg Oral TID WC Swayze, Ava, DO   10 mg at 11/22/19 1252  . multivitamin (RENA-VIT) tablet 1 tablet  1 tablet Oral QHS Swayze, Ava, DO   1 tablet at 11/19/19 2126  . ondansetron (ZOFRAN) injection 4 mg  4 mg Intravenous Q6H PRN Schnier, Dolores Lory, MD      . oxyCODONE-acetaminophen (PERCOCET/ROXICET) 5-325 MG per tablet 1 tablet  1 tablet Oral Q4H PRN Sharen Hones, MD   1 tablet at 11/21/19 0813  . pantoprazole (PROTONIX) EC tablet 40 mg  40 mg Oral BID Sharen Hones, MD   40 mg at 11/22/19 1251  . sodium chloride flush (NS) 0.9  % injection 3 mL  3 mL Intravenous Q12H Schnier, Dolores Lory, MD   3 mL at 11/18/19 2211  . sodium chloride flush (NS) 0.9 % injection 3 mL  3 mL Intravenous PRN Schnier, Dolores Lory, MD   3 mL at 10/28/19 1120  . torsemide (DEMADEX) tablet 40 mg  40 mg Oral Daily Elodia Florence., MD   40 mg at 11/22/19 1249  . traZODone (DESYREL) tablet 100 mg  100 mg Oral QHS Taunja Brickner, Madie Reno, MD   100 mg at 11/21/19 2221    Musculoskeletal: Strength & Muscle Tone: within normal limits Gait & Station: normal Patient leans: N/A  Psychiatric Specialty Exam: Physical Exam Vitals and nursing note reviewed.  Constitutional:      Appearance: She is well-developed.  HENT:     Head: Normocephalic and atraumatic.  Eyes:     Conjunctiva/sclera: Conjunctivae normal.     Pupils: Pupils are equal, round, and reactive to light.  Cardiovascular:     Heart sounds: Normal heart sounds.  Pulmonary:     Effort: Pulmonary effort is normal.  Abdominal:     Palpations: Abdomen is soft.  Musculoskeletal:        General: Normal range of motion.     Cervical back: Normal range of motion.  Skin:    General: Skin is warm and dry.  Neurological:     General: No focal deficit present.     Mental Status: She is alert.  Psychiatric:        Mood and Affect: Mood normal.     Review of Systems  Constitutional: Negative.   HENT: Negative.   Eyes: Negative.   Respiratory: Negative.   Cardiovascular: Negative.   Gastrointestinal: Negative.   Musculoskeletal: Negative.   Skin: Negative.   Neurological: Negative.   Psychiatric/Behavioral: Negative.     Blood pressure 110/82, pulse 96, temperature 97.9 F (36.6 C), temperature  source Oral, resp. rate 20, height 5\' 6"  (1.676 m), weight 111.6 kg, SpO2 99 %.Body mass index is 39.71 kg/m.  General Appearance: Casual  Eye Contact:  Fair  Speech:  Clear and Coherent  Volume:  Decreased  Mood:  Euthymic  Affect:  Congruent  Thought Process:  Goal Directed   Orientation:  Full (Time, Place, and Person)  Thought Content:  Logical  Suicidal Thoughts:  No  Homicidal Thoughts:  No  Memory:  Immediate;   Fair Recent;   Fair Remote;   Fair  Judgement:  Fair  Insight:  Fair  Psychomotor Activity:  Decreased  Concentration:  Concentration: Fair  Recall:  [employer] of Knowledge:  Fair  Language:  Fair  Akathisia:  No  Handed:  Right  AIMS (if indicated):     Assets:  Desire for Improvement Resilience  ADL's:  Impaired  Cognition:  Impaired,  Mild  Sleep:        Treatment Plan Summary: Plan Patient currently appears stable.  Supportive counseling and therapy and encouragement provided.  Glad to see that right now she seems to be doing all right.  Still hoping for discharge as soon as can be arranged.  No change to medication.  Disposition: Supportive therapy provided about ongoing stressors.  Alethia Berthold, MD 11/22/2019 1:54 PM

## 2019-11-22 NOTE — Progress Notes (Signed)
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  After admission to the hospital, she was diagnosed with acute on chronic combinedsystolic and diastolic congestive heart failurewith ejection fraction 25 to 30%. She was not responding to IV Lasix. She was seen by nephrology, temporary dialysis catheter was performed and she was dialyzed on 10/17 and 10/18.Patient also complaining of abdominal pain, she had a paracentesis, study showed possible spontaneous peritonitis, she was treated with Rocephin and Flagyl.  Patient had a worsening respiratory status on 10/17, was diagnosed with aspiration pneumonia. She was treated with Rocephin, Zithromax and Flagyl. Patient also had abdominal pain and a CT scan suspect ischemia, but seen by general surgery on 10/19, no need for surgery.  Assessment & Plan:  Spontaneous bacterial peritonitis: Repeat paracentesis showed significant decrease of white cell in ascites, antibiotics effective. However she still has a lot of neutrophils in 11/03/2019 fluid. Culture has been negative.Pt received many days ofRocephin, sincediscontinued.  -Continue cipro for ppx   Acute on chronic combined systolic and diastolic congestive heart failure. Acute hypoxemic respiratory failure. -Echo from 06/22/2019 showed ejection fraction of 25 to 30% with grade 3 diastolic dysfunction. She was dialyzed x2, but now getting diuresed. Pt reported does not want to get dialysis again. - repeat CXR with improved edema  11/7 -nephrology c/s, appreciate recs (see 11/8 note) - torsemide 40 mg daily -On 3 L of oxygen via nasal cannula.   Liver cirrhosis with ascites:Noted. GI consulted previously in hospitalization (see prior notes). -Continue cipro for SBP ppx  Hypotension: Remained stable.  Continue midodrine 10 mg TID.    Acute renal failure on chronic kidney disease stage IIIb: baseline creatinine appears to be 1.66. Creatinine peaked at 4.16 -Creatinine: 3.71, GFR: 13 -No plan for further dialysis per renal. Pt reportedly does not want dialysis again -renal transitioned to torsemide 40 mg-continue same.  Hyponatremia: Mild.  -Improved from 128-131.  Continue to monitor.  She is asymptomatic.  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 However, there is no GI bleed observed while in the hospital. It is not safe to continue hold anticoagulation. Anticoagulation has now been restarted with eliquis.  -Monitor hemoglobin. -cont Eliquis  Overgrown toenails: will need outpatient podiatry follow up  Anemia of chronic disease:  -In the setting of CKD.  H&H is stable between 7-8.  MCV: 71.2.  -cont iron supplement -No indication of transfusion at this time.  Chronic thrombocytosis: Platelet: 441. Improving.  Hypokalemia:Resolved.  GERD: Continue PPI   Depression/insomnia: Continue trazodone  Vaginal Itching: Denies vaginal discharge.  Concern about fungal infection in the setting of chronic antibiotics.  Refused exam.  Will start on ketoconazole cream twice daily.  Goals of care: appreciate palliative care involvement.  Complex situation overall.  She's now DNR with no plans for dialysis.  GOC are often difficult due to her orientation and limited insight at times, but she's been relatively consistent in her overall goals (no dialysis or heroic measures, consistent with her DNR).  Limited insight at  times makes discharge planning more  complex.  She was seen earlier by psych on 10/21 who noted that "she is mostly unable to make decisions for herself safely about her overall treatment". On 11/8, she attempted to leave against medical advice.  No safe DC plan was identified at that time.  Per Dr. Weber Cooks, she does not have capacity for safe decisions about living situation.  Overall a difficult situation, DSS working with [facility] team as well.  DVT prophylaxis: Eliquis Code Status: DNR Family Communication:  None present at bedside.  Plan of care discussed with patient in length and she verbalized understanding and agreed with it.  Disposition Plan: ?SNF  Consultants:  Palliative Nephrology GI  Procedures:  10/20 perma cath insertion perma cath removal 11/5  Antimicrobials:   As per chart  Status is: Inpatient  Dispo: The patient is from: home              Anticipated d/c is to: Pending              Anticipated d/c date is: >3 days              Patient currently Not medically stable for the discharge  Subjective:  Patient seen and examined.  Complaining of Vaginal itching since last night.  Denies vaginal discharge, bleeding, urinary symptoms, fever, chills, nausea, vomiting or lower abdominal pain..    Objective: Vitals:   11/21/19 2351 11/22/19 0514 11/22/19 0626 11/22/19 0731  BP: (!) 118/91 108/85  (!) 115/93  Pulse: 92 91  98  Resp: 17 17  16   Temp: 98.5 F (36.9 C) 99.1 F (37.3 C)  97.8 F (36.6 C)  TempSrc: Oral Oral  Oral  SpO2: 100% 100%  95%  Weight:   111.6 kg   Height:        Intake/Output Summary (Last 24 hours) at 11/22/2019 1047 Last data filed at 11/21/2019 1418 Gross per 24 hour  Intake 120 ml  Output --  Net 120 ml   Filed Weights   11/14/19 0515 11/22/19 0626  Weight: 104.1 kg 111.6 kg    Examination:  General exam: Appears calm and comfortable, on 3 L of oxygen via nasal cannula, communicating well. Respiratory system: Clear to  auscultation. Respiratory effort normal. Cardiovascular system: S1 & S2 heard, RRR. No JVD, murmurs, rubs, gallops or clicks. No pedal edema. Gastrointestinal system: Abdomen is nondistended, soft and nontender. No organomegaly or masses felt. Normal bowel sounds heard. Refused genital exam. Central nervous system: Alert and oriented. No focal neurological deficits. Extremities: Symmetric 5 x 5 power. Skin: No rashes, lesions or ulcers.  Data Reviewed: I have personally reviewed following labs and imaging studies  CBC: Recent Labs  Lab 11/16/19 0822 11/20/19 0652  WBC 8.4 9.9  NEUTROABS 6.0  --   HGB 7.7* 7.4*  HCT 24.0* 23.2*  MCV 71.0* 71.2*  PLT 522* 696*   Basic Metabolic Panel: Recent Labs  Lab 11/16/19 0822 11/20/19 0652  NA 128* 131*  K 5.1 4.2  CL 92* 95*  CO2 25 25  GLUCOSE 101* 107*  BUN 61* 59*  CREATININE 3.93* 3.71*  CALCIUM 8.9 8.8*  MG 1.7  --   PHOS 3.9  --    GFR: Estimated Creatinine Clearance: 19.7 mL/min (A) (by C-G formula based on SCr of 3.71 mg/dL (H)). Liver Function Tests: Recent Labs  Lab 11/16/19 0822  AST 26  ALT 10  ALKPHOS 252*  BILITOT 1.0  PROT 5.6*  ALBUMIN 2.3*   No results for input(s):  LIPASE, AMYLASE in the last 168 hours. No results for input(s): AMMONIA in the last 168 hours. Coagulation Profile: Recent Labs  Lab 11/16/19 0822  INR 3.2*   Cardiac Enzymes: No results for input(s): CKTOTAL, CKMB, CKMBINDEX, TROPONINI in the last 168 hours. BNP (last 3 results) No results for input(s): PROBNP in the last 8760 hours. HbA1C: No results for input(s): HGBA1C in the last 72 hours. CBG: No results for input(s): GLUCAP in the last 168 hours. Lipid Profile: No results for input(s): CHOL, HDL, LDLCALC, TRIG, CHOLHDL, LDLDIRECT in the last 72 hours. Thyroid Function Tests: No results for input(s): TSH, T4TOTAL, FREET4, T3FREE, THYROIDAB in the last 72 hours. Anemia Panel: No results for input(s): VITAMINB12, FOLATE,  FERRITIN, TIBC, IRON, RETICCTPCT in the last 72 hours. Sepsis Labs: No results for input(s): PROCALCITON, LATICACIDVEN in the last 168 hours.  No results found for this or any previous visit (from the past 240 hour(s)).    Radiology Studies: No results found.  Scheduled Meds: . apixaban  5 mg Oral BID  . ciprofloxacin  500 mg Oral Q breakfast  . iron polysaccharides  150 mg Oral Daily  . ketoconazole   Topical BID  . mouth rinse  15 mL Mouth Rinse BID  . melatonin  2.5 mg Oral QHS  . midodrine  10 mg Oral TID WC  . multivitamin  1 tablet Oral QHS  . pantoprazole  40 mg Oral BID  . sodium chloride flush  3 mL Intravenous Q12H  . torsemide  40 mg Oral Daily  . traZODone  100 mg Oral QHS   Continuous Infusions: . sodium chloride Stopped (11/01/19 1131)     LOS: 34 days   Time spent: 30 minutes  Zyanne Schumm Loann Quill, MD [facility]  If 7PM-7AM, please contact night-coverage [email or web address] 11/22/2019, 10:47 AM

## 2019-11-23 DIAGNOSIS — K652 Spontaneous bacterial peritonitis: Secondary | ICD-10-CM | POA: Diagnosis not present

## 2019-11-23 LAB — COMPREHENSIVE METABOLIC PANEL
ALT: 9 U/L (ref 0–44)
AST: 19 U/L (ref 15–41)
Albumin: 2.2 g/dL — ABNORMAL LOW (ref 3.5–5.0)
Alkaline Phosphatase: 248 U/L — ABNORMAL HIGH (ref 38–126)
Anion gap: 13 (ref 5–15)
BUN: 65 mg/dL — ABNORMAL HIGH (ref 8–23)
CO2: 24 mmol/L (ref 22–32)
Calcium: 8.9 mg/dL (ref 8.9–10.3)
Chloride: 96 mmol/L — ABNORMAL LOW (ref 98–111)
Creatinine, Ser: 3.24 mg/dL — ABNORMAL HIGH (ref 0.44–1.00)
GFR, Estimated: 15 mL/min — ABNORMAL LOW (ref >60)
Glucose, Bld: 88 mg/dL (ref 70–99)
Potassium: 3.7 mmol/L (ref 3.5–5.1)
Sodium: 133 mmol/L — ABNORMAL LOW (ref 135–145)
Total Bilirubin: 1 mg/dL (ref 0.3–1.2)
Total Protein: 6 g/dL — ABNORMAL LOW (ref 6.5–8.1)

## 2019-11-23 LAB — CBC
HCT: 23.8 % — ABNORMAL LOW (ref 36.0–46.0)
Hemoglobin: 7.6 g/dL — ABNORMAL LOW (ref 12.0–15.0)
MCH: 22.6 pg — ABNORMAL LOW (ref 26.0–34.0)
MCHC: 31.9 g/dL (ref 30.0–36.0)
MCV: 70.8 fL — ABNORMAL LOW (ref 80.0–100.0)
Platelets: 430 10*3/uL — ABNORMAL HIGH (ref 150–400)
RBC: 3.36 MIL/uL — ABNORMAL LOW (ref 3.87–5.11)
RDW: 19.8 % — ABNORMAL HIGH (ref 11.5–15.5)
WBC: 7.7 10*3/uL (ref 4.0–10.5)
nRBC: 0 % (ref 0.0–0.2)

## 2019-11-23 LAB — CREATININE, SERUM
Creatinine, Ser: 3.25 mg/dL — ABNORMAL HIGH (ref 0.44–1.00)
GFR, Estimated: 15 mL/min — ABNORMAL LOW (ref >60)

## 2019-11-23 MED ORDER — POLYETHYLENE GLYCOL 3350 17 G PO PACK
17.0000 g | PACK | Freq: Every day | ORAL | Status: DC
  Administered 2019-11-23 – 2020-01-29 (×40): 17 g via ORAL
  Filled 2019-11-23 (×48): qty 1

## 2019-11-23 NOTE — Progress Notes (Signed)
Physical Therapy Treatment Patient Details Name: Katelyn Lane MRN: [id number] DOB: 1955/06/11 Today's Date: 11/23/2019    History of Present Illness Pt is a 64 yo female who presented to ER secondary to LE edema, progressive SOB; admitted for management of acute/chronic CHF, spontaneous bacterial peritonitis (s/p paracentesis 10/25).  Hospital course also significant for initiation of dialysis (initially, R temp fem cath; converted to R permcath).  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    PT Comments    Pt was motivated to participate during the session and did not require any physical assistance with any functional task.  Pt required extra time and effort with bed mobility and transfers and was moderately SOB after amb 100' but was unable to obtain an SpO2 reading due to poor signal quality.  Pt's SOB resolved in <60 sec upon returning to sitting.  Pt remained impulsive at times and is at an elevated risk for falls due to poor safety awareness and will benefit from PT services in a SNF setting upon discharge to safely address deficits listed in patient problem list for decreased caregiver assistance and eventual return to PLOF.     Follow Up Recommendations  SNF;Supervision/Assistance - 24 hour     Equipment Recommendations  Rolling walker with 5" wheels;3in1 (PT)    Recommendations for Other Services       Precautions / Restrictions Precautions Precautions: Fall Restrictions Weight Bearing Restrictions: No Other Position/Activity Restrictions: Impulsive, self directs care, very easily  agitated, careful    Mobility  Bed Mobility Overal bed mobility: Needs Assistance Bed Mobility: Supine to Sit     Supine to sit: Supervision     General bed mobility comments: Extra time and effort and use of bed rail required  Transfers Overall transfer level: Needs assistance Equipment used: Rolling walker (2 wheeled) Transfers: Sit to/from Stand and Stand pivot transfer training Sit to Stand: Supervision;From elevated surface         General transfer comment: Extra effort to stand from an elevated EOB but with no LOB  Ambulation/Gait   Gait Distance (Feet): 100 Feet Assistive device: Rolling walker (2 wheeled) Gait Pattern/deviations: Step-through pattern;Trunk flexed;Decreased step length - right;Decreased step length - left Gait velocity: decreased   General Gait Details: Poor safety awareness with amb too far from the RW but steady without LOB   Stairs             Wheelchair Mobility    Modified Rankin (Stroke Patients Only)       Balance Overall balance assessment: Needs assistance Sitting-balance support: Feet supported;No upper extremity supported Sitting balance-Leahy Scale: Good     Standing balance support: Bilateral upper extremity supported;During functional activity Standing balance-Leahy Scale: Fair                              Cognition Arousal/Alertness: Awake/alert Behavior During Therapy: Agitated;Impulsive Overall Cognitive Status: Within Functional Limits for tasks assessed                                        Exercises  General Comments        Pertinent Vitals/Pain Pain Assessment: No/denies pain    Home Living                      Prior Function            PT Goals (current goals can now be found in the care plan section) Progress towards PT goals: Progressing toward goals    Frequency    Min 2X/week      PT Plan Current plan remains appropriate     Co-evaluation              AM-PAC PT "6 Clicks" Mobility   Outcome Measure  Help needed turning from your back to your side while in a flat bed without using bedrails?: A Little Help needed moving from lying on your back to sitting on the side of a flat bed without using bedrails?: A Little Help needed moving to and from a bed to a chair (including a wheelchair)?: A Little Help needed standing up from a chair using your arms (e.g., wheelchair or bedside chair)?: A Little Help needed to walk in hospital room?: A Little Help needed climbing 3-5 steps with a railing? : A Little 6 Click Score: 18    End of Session Equipment Utilized During Treatment: Gait belt Activity Tolerance: Patient tolerated treatment well Patient left: with nursing/sitter in room;Other (comment) (Pt left with CNA at EOB obtaining vital signs) Nurse Communication: Mobility status PT Visit Diagnosis: Muscle weakness (generalized) (M62.81);Difficulty in walking, not elsewhere classified (R26.2)     Time: 0626-9485 PT Time Calculation (min) (ACUTE ONLY): 35 min  Charges:  $Gait Training: 8-22 mins $Therapeutic Activity: 8-22 mins                     D. Scott Kamerin Axford PT, DPT 11/23/19, 4:21 PM

## 2019-11-23 NOTE — Progress Notes (Signed)
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  After admission to the hospital, she was diagnosed with acute on chronic combinedsystolic and diastolic congestive heart failurewith ejection fraction 25 to 30%. She was not responding to IV Lasix. She was seen by nephrology, temporary dialysis catheter was performed and she was dialyzed on 10/17 and 10/18.Patient also complaining of abdominal pain, she had a paracentesis, study showed possible spontaneous peritonitis, she was treated with Rocephin and Flagyl.  Patient had a worsening respiratory status on 10/17, was diagnosed with aspiration pneumonia. She was treated with Rocephin, Zithromax and Flagyl. Patient also had abdominal pain and a CT scan suspect ischemia, but seen by general surgery on 10/19, no need for surgery.  Assessment & Plan:  Spontaneous bacterial peritonitis: Repeat paracentesis showed significant decrease of white cell in ascites, antibiotics effective. However she still has a lot of neutrophils in 11/03/2019 fluid. Culture has been negative.Pt received many days ofRocephin, sincediscontinued.  -Continue cipro for ppx   Acute on chronic combined systolic and diastolic congestive heart failure. Acute hypoxemic respiratory failure. -Echo from 06/22/2019 showed ejection fraction of 25 to 30% with grade 3 diastolic dysfunction. She was dialyzed x2, but now getting diuresed. Pt reported does not want to get dialysis again. - repeat CXR with improved edema  11/7 -nephrology c/s, appreciate recs (see 11/8 note) - torsemide 40 mg daily -On 3 L of oxygen via nasal cannula.   Liver cirrhosis with ascites:Noted. GI consulted previously in hospitalization (see prior notes). -Continue cipro for SBP ppx  Dependent edema: -Patient has lower abdominal, lower back wall & leg edema-multifactorial in the setting of cirrhosis, CHF, renal failure & debility -Check CMP.  Creatinine/GFR: Improving.  No respiratory symptoms.  Patient seems comfortable. -Consult PT  Hypotension: Remained stable.  Continue midodrine 10 mg TID.    Acute renal failure on chronic kidney disease stage IIIb: baseline creatinine appears to be 1.66. Creatinine peaked at 4.16 -Creatinine: 3.71-->3.25, GFR: 13-->15 -No plan for further dialysis per renal. Pt reportedly does not want dialysis again -renal transitioned to torsemide 40 mg-continue same.  Hyponatremia: Mild.  -Improved from 128-131.  Continue to monitor.  She is asymptomatic.  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 However, there is no GI bleed observed while in the hospital. It is not safe to continue hold anticoagulation. Anticoagulation has now been restarted with eliquis.  -Monitor hemoglobin. -cont Eliquis  Overgrown toenails: will need outpatient podiatry follow up  Anemia of chronic disease:  -In the setting of CKD.  H&H is stable between 7-8.  MCV: 71.2.  -cont iron supplement -No indication of transfusion at this time.  Chronic thrombocytosis: Platelet: 441-->430. Improving.  Hypokalemia:Resolved.  GERD: Continue PPI   Depression/insomnia: Continue trazodone  Vaginal Itching: Denies vaginal discharge.  Concern about fungal infection in the setting of chronic antibiotics.  Refused exam.  Cont.  ketoconazole cream twice daily.  Chronic constipation: Start on MiraLAX.  Goals of care: appreciate  palliative care involvement.  Complex situation overall.  She's now DNR with no plans  for dialysis.  GOC are often difficult due to her orientation and limited insight at times, but she's been relatively consistent in her overall goals (no dialysis or heroic measures, consistent with her DNR).  Limited insight at times makes discharge planning more complex.  She was seen earlier by psych on 10/21 who noted that "she is mostly unable to make decisions for herself safely about her overall treatment". On 11/8, she attempted to leave against medical advice.  No safe DC plan was identified at that time.  Per Dr. Weber Cooks, she does not have capacity for safe decisions about living situation.  Overall a difficult situation, DSS working with [facility] team as well. Family does not want to take decisions for her.  DVT prophylaxis: Eliquis Code Status: DNR Family Communication:  None present at bedside.  Plan of care discussed with patient in length and she verbalized understanding and agreed with it.  Disposition Plan: ?SNF  Consultants:  Palliative Nephrology GI  Procedures:  10/20 perma cath insertion perma cath removal 11/5  Antimicrobials:   As per chart  Status is: Inpatient  Dispo: The patient is from: home              Anticipated d/c is to: Pending              Anticipated d/c date is: >3 days              Patient currently Not medically stable for the discharge  Subjective:  Patient seen and examined.  Sitting comfortably on the bed.  Complaining of lower abdominal wall and back wall and lower extremities edema.  She denies any worsening respiratory symptoms, orthopnea, PND or chest pain.  Requested for stool softener due to constipation.  Objective: Vitals:   11/23/19 0348 11/23/19 0500 11/23/19 0834 11/23/19 1126  BP: 110/83  105/80 103/87  Pulse: 73  74 83  Resp: 17  18 17   Temp: 98.2 F (36.8 C)  98.2 F (36.8 C) 97.9 F (36.6 C)  TempSrc:   Oral Oral  SpO2: 95%  96% 100%   Weight:  106.8 kg    Height:        Intake/Output Summary (Last 24 hours) at 11/23/2019 1301 Last data filed at 11/23/2019 1015 Gross per 24 hour  Intake 240 ml  Output --  Net 240 ml   Filed Weights   11/22/19 0626 11/23/19 0500  Weight: 111.6 kg 106.8 kg    Examination:  General exam: Appears calm and comfortable, communicating well, obese Respiratory system: Clear to auscultation. Respiratory effort normal. Cardiovascular system: S1 & S2 heard, RRR. No JVD, murmurs, rubs, gallops or clicks.  Bilateral 2+ pitting edema positive  gastrointestinal system: Abdomen is nondistended, soft and nontender. No organomegaly or masses felt. Normal bowel sounds heard.  Lower abdominal wall edema noted Central nervous system: Alert and oriented. No focal neurological deficits. Extremities: Symmetric 5 x 5 power. Skin: lower back edema positive  Data Reviewed: I have personally reviewed following labs and imaging studies  CBC: Recent Labs  Lab 11/20/19 0652 11/23/19 0422  WBC 9.9 7.7  HGB 7.4* 7.6*  HCT 23.2* 23.8*  MCV 71.2* 70.8*  PLT 441* 094*   Basic Metabolic Panel: Recent Labs  Lab 11/20/19 0652 11/23/19 0422  NA 131*  --   K 4.2  --   CL 95*  --   CO2 25  --   GLUCOSE 107*  --   BUN 59*  --   CREATININE 3.71*  3.25*  CALCIUM 8.8*  --    GFR: Estimated Creatinine Clearance: 21.9 mL/min (A) (by C-G formula based on SCr of 3.25 mg/dL (H)). Liver Function Tests: No results for input(s): AST, ALT, ALKPHOS, BILITOT, PROT, ALBUMIN in the last 168 hours. No results for input(s): LIPASE, AMYLASE in the last 168 hours. No results for input(s): AMMONIA in the last 168 hours. Coagulation Profile: No results for input(s): INR, PROTIME in the last 168 hours. Cardiac Enzymes: No results for input(s): CKTOTAL, CKMB, CKMBINDEX, TROPONINI in the last 168 hours. BNP (last 3 results) No results for input(s): PROBNP in the last 8760 hours. HbA1C: No results for input(s):  HGBA1C in the last 72 hours. CBG: No results for input(s): GLUCAP in the last 168 hours. Lipid Profile: No results for input(s): CHOL, HDL, LDLCALC, TRIG, CHOLHDL, LDLDIRECT in the last 72 hours. Thyroid Function Tests: No results for input(s): TSH, T4TOTAL, FREET4, T3FREE, THYROIDAB in the last 72 hours. Anemia Panel: No results for input(s): VITAMINB12, FOLATE, FERRITIN, TIBC, IRON, RETICCTPCT in the last 72 hours. Sepsis Labs: No results for input(s): PROCALCITON, LATICACIDVEN in the last 168 hours.  No results found for this or any previous visit (from the past 240 hour(s)).    Radiology Studies: No results found.  Scheduled Meds: . apixaban  5 mg Oral BID  . ciprofloxacin  500 mg Oral Q breakfast  . iron polysaccharides  150 mg Oral Daily  . ketoconazole   Topical BID  . mouth rinse  15 mL Mouth Rinse BID  . melatonin  2.5 mg Oral QHS  . midodrine  10 mg Oral TID WC  . multivitamin  1 tablet Oral QHS  . pantoprazole  40 mg Oral BID  . polyethylene glycol  17 g Oral Daily  . sodium chloride flush  3 mL Intravenous Q12H  . torsemide  40 mg Oral Daily  . traZODone  100 mg Oral QHS   Continuous Infusions: . sodium chloride Stopped (11/01/19 1131)     LOS: 35 days   Time spent: 30 minutes  Kelven Flater Loann Quill, MD [facility]  If 7PM-7AM, please contact night-coverage [email or web address] 11/23/2019, 1:01 PM

## 2019-11-23 NOTE — TOC Progression Note (Signed)
Transition of Care [facility]) - Progression Note    Patient Details  Name: Katelyn Lane MRN: [id number] Date of Birth: 06-Nov-1955  Transition of [facility]) CM/SW [place], RN Phone Number: 11/23/2019, 4:03 PM  Clinical Narrative:   RNCM reached out to Raymond Gurney with DSS. She reports that she is still in communication with her superior as to how to proceed.          Expected Discharge Plan and Services                                                 Social Determinants of Health (SDOH) Interventions    Readmission Risk Interventions Readmission Risk Prevention Plan 09/02/2019 08/14/2019 07/27/2019  Transportation Screening Complete Complete Complete  Medication Review [employer] photographer) Complete Referral to Pharmacy -  PCP or Specialist appointment within 3-5 days of discharge Complete Complete Complete  HRI or Home Care Consult - Complete Complete  SW Recovery Care/Counseling Consult - Complete Complete  Palliative Care Screening Not Applicable Not Applicable Not Applicable  Skilled Nursing Facility Complete Not Applicable Not Applicable  Some recent data might be hidden

## 2019-11-24 DIAGNOSIS — K652 Spontaneous bacterial peritonitis: Secondary | ICD-10-CM | POA: Diagnosis not present

## 2019-11-24 MED ORDER — LORAZEPAM 2 MG/ML IJ SOLN
1.0000 mg | Freq: Once | INTRAMUSCULAR | Status: DC
  Filled 2019-11-24 (×2): qty 1

## 2019-11-24 NOTE — Progress Notes (Signed)
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  After admission to the hospital, she was diagnosed with acute on chronic combinedsystolic and diastolic congestive heart failurewith ejection fraction 25 to 30%. She was not responding to IV Lasix. She was seen by nephrology, temporary dialysis catheter was performed and she was dialyzed on 10/17 and 10/18.Patient also complaining of abdominal pain, she had a paracentesis, study showed possible spontaneous peritonitis, she was treated with Rocephin and Flagyl.  Patient had a worsening respiratory status on 10/17, was diagnosed with aspiration pneumonia. She was treated with Rocephin, Zithromax and Flagyl. Patient also had abdominal pain and a CT scan suspect ischemia, but seen by general surgery on 10/19, no need for surgery.  Assessment & Plan:  Spontaneous bacterial peritonitis: Repeat paracentesis showed significant decrease of white cell in ascites, antibiotics effective. However she still has a lot of neutrophils in 11/03/2019 fluid. Culture has been negative.Pt received many days ofRocephin, sincediscontinued.  -Continue cipro for ppx   Acute on chronic combined systolic and diastolic congestive heart failure. Acute hypoxemic respiratory failure. -Echo from 06/22/2019 showed ejection fraction of 25 to 30% with grade 3 diastolic dysfunction. She was dialyzed x2, but now getting diuresed. Pt reported does not want to get dialysis again. - repeat CXR with improved edema  11/7 -nephrology c/s, appreciate recs was started on- torsemide 40 mg daily -On 3 L of oxygen via nasal cannula at abaeline.   Liver cirrhosis with ascites:Noted. GI consulted previously in hospitalization (see prior notes). -Continue cipro for SBP ppx  Dependent edema: -Patient has lower abdominal, lower back wall & leg edema-multifactorial in the setting of cirrhosis, CHF, renal failure & debility -Creatinine/GFR: Improving.  No respiratory symptoms.  Patient seems comfortable. -Consulted PT  Hypotension: Remained stable.  Continue midodrine 10 mg TID.    Acute renal failure on chronic kidney disease stage IIIb: baseline creatinine appears to be 1.66. Creatinine peaked at 4.16 -Creatinine: 3.71-->3.25-->3.24, GFR: 13-->15 -No plan for further dialysis per renal. Pt reportedly does not want dialysis again -renal transitioned to torsemide 40 mg-continue same.  Hyponatremia: Mild.  -Improved from [phone].  Continue to monitor.  She is asymptomatic.  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 However, there is no GI bleed observed while in the hospital. It is not safe to continue hold anticoagulation. Anticoagulation has now been restarted with eliquis.  -Monitor hemoglobin. -cont Eliquis  Overgrown toenails: will need outpatient podiatry follow up  Anemia of chronic disease:  -In the setting of CKD.  H&H is stable between 7-8.  MCV: 71.2.  -cont iron supplement -No indication of transfusion at this time.  Chronic thrombocytosis: Platelet: 522-->441-->430. Improving.  Hypokalemia:Resolved.  GERD: Continue PPI   Depression/insomnia: Continue trazodone  Vaginal Itching: Denies vaginal discharge.  Concern about fungal infection in the setting of chronic antibiotics.  Refused exam.  Cont.  ketoconazole cream twice daily. -Her symptoms resolved as per patient  Chronic  constipation: cont. MiraLAX.  Goals of care: appreciate palliative care involvement.  Complex situation overall.  She's now DNR  with no plans for dialysis.  GOC are often difficult due to her orientation and limited insight at times, but she's been relatively consistent in her overall goals (no dialysis or heroic measures, consistent with her DNR).  Limited insight at times makes discharge planning more complex.  She was seen earlier by psych on 10/21 who noted that "she is mostly unable to make decisions for herself safely about her overall treatment". On 11/8, she attempted to leave against medical advice.  No safe DC plan was identified at that time.  Per Dr. Weber Cooks, she does not have capacity for safe decisions about living situation.  Overall a difficult situation, DSS working with [facility] team as well. Family does not want to take decisions for her.  DVT prophylaxis: Eliquis Code Status: DNR Family Communication:  None present at bedside.  Plan of care discussed with patient in length and she verbalized understanding and agreed with it.  Disposition Plan: await SNF placement  Consultants:  Palliative Nephrology GI  Procedures:  10/20 perma cath insertion perma cath removal 11/5  Antimicrobials:   As per chart  Status is: Inpatient  Dispo: The patient is from: home              Anticipated d/c is to: Pending              Anticipated d/c date is: >3 days              Patient currently medically stable for the discharge  Subjective:  Patient seen and examined.  Sitting comfortably on the bed.  Complaining of lower abdominal wall and back wall and lower extremities edema.  She denies any worsening respiratory symptoms, orthopnea, PND or chest pain.  Requested for stool softener due to constipation.  Objective: Vitals:   11/23/19 2325 11/24/19 0541 11/24/19 0642 11/24/19 0726  BP: 116/90 111/81  (!) 108/92  Pulse: 92 89  71  Resp: 19 19  18   Temp: (!) 97.4 F (36.3 C) 97.7 F  (36.5 C)  98.2 F (36.8 C)  TempSrc: Oral Oral    SpO2: 100% 100%  100%  Weight:   104.3 kg   Height:        Intake/Output Summary (Last 24 hours) at 11/24/2019 1132 Last data filed at 11/24/2019 0951 Gross per 24 hour  Intake 50 ml  Output --  Net 50 ml   Filed Weights   11/22/19 0626 11/23/19 0500 11/24/19 0642  Weight: 111.6 kg 106.8 kg 104.3 kg    Examination:  General exam: Appears calm and comfortable, on 3 L of oxygen via nasal cannula, obese, communicating well Respiratory system: Clear to auscultation. Respiratory effort normal. Cardiovascular system: S1 & S2 heard, RRR. No JVD, murmurs, rubs, gallops or clicks. No pedal edema. Gastrointestinal system: Abdomen is nondistended, soft and nontender. No organomegaly or masses felt. Normal bowel sounds heard. Central nervous system: Alert and oriented. No focal neurological deficits. Extremities: Symmetric 5 x 5 power. Skin: No rashes, lesions or ulcers.   Data Reviewed: I have personally reviewed following labs and imaging studies  CBC: Recent Labs  Lab 11/20/19 0652 11/23/19 0422  WBC 9.9 7.7  HGB 7.4* 7.6*  HCT 23.2* 23.8*  MCV 71.2* 70.8*  PLT 441* 782*   Basic Metabolic Panel: Recent Labs  Lab 11/20/19 0652 11/23/19 0422  NA 131* 133*  K 4.2 3.7  CL 95* 96*  CO2 25 24  GLUCOSE 107* 88  BUN 59* 65*  CREATININE 3.71* 3.24*  3.25*  CALCIUM 8.8* 8.9   GFR: Estimated Creatinine Clearance: 21.6 mL/min (A) (by C-G formula based on SCr of 3.25 mg/dL (H)). Liver Function Tests: Recent Labs  Lab 11/23/19 0422  AST 19  ALT 9  ALKPHOS 248*  BILITOT 1.0  PROT 6.0*  ALBUMIN 2.2*   No results for input(s): LIPASE, AMYLASE in the last 168 hours. No results for input(s): AMMONIA in the last 168 hours. Coagulation Profile: No results for input(s): INR, PROTIME in the last 168 hours. Cardiac Enzymes: No results for input(s): CKTOTAL, CKMB, CKMBINDEX, TROPONINI in the last 168 hours. BNP (last 3  results) No results for input(s): PROBNP in the last 8760 hours. HbA1C: No results for input(s): HGBA1C in the last 72 hours. CBG: No results for input(s): GLUCAP in the last 168 hours. Lipid Profile: No results for input(s): CHOL, HDL, LDLCALC, TRIG, CHOLHDL, LDLDIRECT in the last 72 hours. Thyroid Function Tests: No results for input(s): TSH, T4TOTAL, FREET4, T3FREE, THYROIDAB in the last 72 hours. Anemia Panel: No results for input(s): VITAMINB12, FOLATE, FERRITIN, TIBC, IRON, RETICCTPCT in the last 72 hours. Sepsis Labs: No results for input(s): PROCALCITON, LATICACIDVEN in the last 168 hours.  No results found for this or any previous visit (from the past 240 hour(s)).    Radiology Studies: No results found.  Scheduled Meds: . apixaban  5 mg Oral BID  . ciprofloxacin  500 mg Oral Q breakfast  . iron polysaccharides  150 mg Oral Daily  . ketoconazole   Topical BID  . mouth rinse  15 mL Mouth Rinse BID  . melatonin  2.5 mg Oral QHS  . midodrine  10 mg Oral TID WC  . multivitamin  1 tablet Oral QHS  . pantoprazole  40 mg Oral BID  . polyethylene glycol  17 g Oral Daily  . sodium chloride flush  3 mL Intravenous Q12H  . torsemide  40 mg Oral Daily  . traZODone  100 mg Oral QHS   Continuous Infusions: . sodium chloride Stopped (11/01/19 1131)     LOS: 36 days   Time spent: 35 minutes  Katelyn Lane Katelyn Quill, MD [facility]  If 7PM-7AM, please contact night-coverage [email or web address] 11/24/2019, 11:32 AM
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
## 2019-11-24 NOTE — Progress Notes (Signed)
Nutrition Follow-up  DOCUMENTATION CODES:   Obesity unspecified  INTERVENTION:  Patient continues to decline all nutrition interventions.  Continue Rena-vit po QHS.  NUTRITION DIAGNOSIS:   Increased nutrient needs related to chronic illness (cirrhosis, CHF) as evidenced by estimated needs.  Ongoing.  GOAL:   Patient will meet greater than or equal to 90% of their needs  Progressing.  MONITOR:   PO intake, Supplement acceptance, Labs, Weight trends, Skin, I & O's  REASON FOR ASSESSMENT:   LOS    ASSESSMENT:   64 y.o. 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  Met with patient at bedside. She reported she did not want to speak to RD today and not willing to provide much information at this time. She reports that she is eating well and finishing 100% of her meals. She is documented to be eating 100% of the meals recorded in chart. She continues to decline oral nutrition supplements and other interventions offered by RD.  Medications reviewed and include: Rena-vit QHS, Miralax.  Labs reviewed: Sodium 133, Chloride 96, BUN 65, Creatinine 3.24.   Weight trend: weights fluctuate significantly during admission likely related to fluid status (95-120 kg); currently documented to be 104.3 kg (230 lbs)  Discussed with RN who confirms patient is eating well.  Diet Order:   Diet Order            Diet Heart Room service appropriate? Yes; Fluid consistency: Thin; Fluid restriction: 1500 mL Fluid  Diet effective now                EDUCATION NEEDS:   Education needs have been addressed  Skin:  Skin Assessment: Reviewed RN Assessment (non-pressure injury L leg)  Last BM:  11/9- type 4  Height:   Ht Readings from Last 1  Encounters:  11/13/19 _0  (1.676 m)   Weight:   Wt Readings from Last 1 Encounters:  11/24/19 104.3 kg   Ideal Body Weight:  59 kg  BMI:  Body mass index is 37.12 kg/m.  Estimated Nutritional Needs:   Kcal:  1900-2200kcal/day  Protein:  95-110g/day  Fluid:  1.8-2L/day  Jacklynn Barnacle, MS, RD, LDN Pager number available on Amion

## 2019-11-24 NOTE — Progress Notes (Signed)
[facility]  ROUNDING NOTE   Subjective:   Patient sitting up in bed, appears pleasant and interactive.She denies worsening SOB,nausea and vomiting.    Objective:  Vital signs in last 24 hours:  Temp:  [97.4 F (36.3 C)-98.5 F (36.9 C)] 98.2 F (36.8 C) (11/16 0726) Pulse Rate:  [71-92] 71 (11/16 0726) Resp:  [16-19] 18 (11/16 0726) BP: (103-119)/(81-96) 108/92 (11/16 0726) SpO2:  [100 %] 100 % (11/16 0726) Weight:  [104.3 kg] 104.3 kg (11/16 0642)  Weight change: -2.473 kg Filed Weights   11/22/19 0626 11/23/19 0500 11/24/19 0642  Weight: 111.6 kg 106.8 kg 104.3 kg    Intake/Output: I/O last 3 completed shifts: In: 240 [P.O.:240] Out: -    Intake/Output this shift:  Total I/O In: 50 [P.O.:50] Out: -   Physical Exam: General:  In no acute distress  Head  Oral mucous membranes moist  Lungs:   Respirations even,unlabored, lungs clear  Heart: S1S2, no rubs or gallops  Abdomen:   Non distended, nontender  Extremities:  1+peripheral edema  Neurologic:  Speech clear,answers simple questions appropriately  Skin: No acute lesions or rashes    Basic Metabolic Panel: Recent Labs  Lab 11/20/19 0652 11/23/19 0422  NA 131* 133*  K 4.2 3.7  CL 95* 96*  CO2 25 24  GLUCOSE 107* 88  BUN 59* 65*  CREATININE 3.71* 3.24*  3.25*  CALCIUM 8.8* 8.9    Liver Function Tests: Recent Labs  Lab 11/23/19 0422  AST 19  ALT 9  ALKPHOS 248*  BILITOT 1.0  PROT 6.0*  ALBUMIN 2.2*   No results for input(s): LIPASE, AMYLASE in the last 168 hours. No results for input(s): AMMONIA in the last 168 hours.  CBC: Recent Labs  Lab 11/20/19 0652 11/23/19 0422  WBC 9.9 7.7  HGB 7.4* 7.6*  HCT 23.2* 23.8*  MCV 71.2* 70.8*  PLT 441* 430*    Cardiac Enzymes: No results for input(s): CKTOTAL, CKMB, CKMBINDEX, TROPONINI in the last 168 hours.  BNP: Invalid input(s): POCBNP  CBG: No results for input(s): GLUCAP in the last 168  hours.  Microbiology: Results for orders placed or performed during the hospital encounter of 10/19/19  Respiratory Panel by RT PCR (Flu A&B, Covid) - Nasopharyngeal Swab     Status: None   Collection Time: 10/19/19 11:13 PM   Specimen: Nasopharyngeal Swab  Result Value Ref Range Status   SARS Coronavirus 2 by RT PCR NEGATIVE NEGATIVE Final    Comment: (NOTE) SARS-CoV-2 target nucleic acids are NOT DETECTED.  The SARS-CoV-2 RNA is generally detectable in upper respiratoy specimens during the acute phase of infection. The lowest concentration of SARS-CoV-2 viral copies this assay can detect is 131 copies/mL. A negative result does not preclude SARS-Cov-2 infection and should not be used as the sole basis for treatment or other patient management decisions. A negative result may occur with  improper specimen collection/handling, submission of specimen other than nasopharyngeal swab, presence of viral mutation(s) within the areas targeted by this assay, and inadequate number of viral copies (<131 copies/mL). A negative result must be combined with clinical observations, patient history, and epidemiological information. The expected result is Negative.  Fact Sheet for Patients:  PinkCheek.be  Fact Sheet for Healthcare Providers:  GravelBags.it  This test is no t yet approved or cleared by the [place] FDA and  has been authorized for detection and/or diagnosis of SARS-CoV-2 by FDA under an Emergency Use Authorization (EUA). This EUA will remain  in effect (meaning this test can be used) for the duration of the COVID-19 declaration under Section 564(b)(1) of the Act, 21 U.S.C. section 360bbb-3(b)(1), unless the authorization is terminated or revoked sooner.     Influenza A by PCR NEGATIVE NEGATIVE Final   Influenza B by PCR NEGATIVE NEGATIVE Final    Comment: (NOTE) The Xpert Xpress SARS-CoV-2/FLU/RSV assay is  intended as an aid in  the diagnosis of influenza from Nasopharyngeal swab specimens and  should not be used as a sole basis for treatment. Nasal washings and  aspirates are unacceptable for Xpert Xpress SARS-CoV-2/FLU/RSV  testing.  Fact Sheet for Patients: PinkCheek.be  Fact Sheet for Healthcare Providers: GravelBags.it  This test is not yet approved or cleared by the [place] FDA and  has been authorized for detection and/or diagnosis of SARS-CoV-2 by  FDA under an Emergency Use Authorization (EUA). This EUA will remain  in effect (meaning this test can be used) for the duration of the  Covid-19 declaration under Section 564(b)(1) of the Act, 21  U.S.C. section 360bbb-3(b)(1), unless the authorization is  terminated or revoked. Performed at [facility], [address] [place], [place] [address]   MRSA PCR Screening     Status: None   Collection Time: 10/25/19  2:50 PM   Specimen: Nasopharyngeal  Result Value Ref Range Status   MRSA by PCR NEGATIVE NEGATIVE Final    Comment:        The GeneXpert MRSA Assay (FDA approved for NASAL specimens only), is one component of a comprehensive MRSA colonization surveillance program. It is not intended to diagnose MRSA infection nor to guide or monitor treatment for MRSA infections. Performed at [facility], [address] [place], [place] [address]   CULTURE, BLOOD (ROUTINE X 2) w Reflex to ID Panel     Status: None   Collection Time: 10/25/19  4:29 PM   Specimen: BLOOD  Result Value Ref Range Status   Specimen Description BLOOD CENTRAL LINE  Final   Special Requests   Final    BOTTLES DRAWN AEROBIC AND ANAEROBIC Blood Culture adequate volume   Culture   Final    NO GROWTH 5 DAYS Performed at [facility], [address] [place], [place] [address]    Report Status 10/30/2019 FINAL  Final  CULTURE, BLOOD (ROUTINE X 2) w Reflex to  ID Panel     Status: None   Collection Time: 10/26/19  6:45 AM   Specimen: BLOOD  Result Value Ref Range Status   Specimen Description BLOOD BLOOD LEFT HAND  Final   Special Requests   Final    BOTTLES DRAWN AEROBIC AND ANAEROBIC Blood Culture adequate volume   Culture   Final    NO GROWTH 5 DAYS Performed at [facility], [address] [place], [place] [address]    Report Status 10/31/2019 FINAL  Final  Body fluid culture     Status: None   Collection Time: 10/26/19 11:30 AM   Specimen: Fluid  Result Value Ref Range Status   Specimen Description   Final    FLUID Performed at [facility], [address] [place], [place] [address]    Special Requests   Final    PERITONEAL Performed at [facility], [address] [place], [place] [address]    Gram Stain   Final    ABUNDANT WBC PRESENT, PREDOMINANTLY PMN NO ORGANISMS SEEN    Culture   Final  NO GROWTH Performed at [facility], [place] [address], [place], [place] [address]    Report Status 10/30/2019 FINAL  Final  Body fluid culture     Status: None   Collection Time: 10/29/19  1:39 PM   Specimen: PATH Cytology Peritoneal fluid  Result Value Ref Range Status   Specimen Description   Final    PERITONEAL Performed at [facility], [address] [place], [place] [address]    Special Requests   Final    NONE Performed at [facility] [address] [place], [place] [address]    Gram Stain   Final    MODERATE WBC PRESENT, PREDOMINANTLY PMN NO ORGANISMS SEEN    Culture   Final    NO GROWTH 3 DAYS Performed at [facility], [place] [address], [place], [place] [address]    Report Status 11/02/2019 FINAL  Final  Gram stain     Status: None   Collection Time: 11/02/19  3:30 PM   Specimen: PATH Cytology Peritoneal fluid  Result Value Ref Range Status   Specimen Description PERITONEAL  Final   Special Requests NONE  Final   Gram Stain   Final    WBC  SEEN RED BLOOD CELLS NO ORGANISMS SEEN Performed at [facility], [address] [place], [place] [address]    Report Status 11/02/2019 FINAL  Final    Coagulation Studies: No results for input(s): LABPROT, INR in the last 72 hours.  Urinalysis: No results for input(s): COLORURINE, LABSPEC, PHURINE, GLUCOSEU, HGBUR, BILIRUBINUR, KETONESUR, PROTEINUR, UROBILINOGEN, NITRITE, LEUKOCYTESUR in the last 72 hours.  Invalid input(s): APPERANCEUR    Imaging: No results found.   Medications:   . sodium chloride Stopped (11/01/19 1131)   . apixaban  5 mg Oral BID  . ciprofloxacin  500 mg Oral Q breakfast  . iron polysaccharides  150 mg Oral Daily  . ketoconazole   Topical BID  . mouth rinse  15 mL Mouth Rinse BID  . melatonin  2.5 mg Oral QHS  . midodrine  10 mg Oral TID WC  . multivitamin  1 tablet Oral QHS  . pantoprazole  40 mg Oral BID  . polyethylene glycol  17 g Oral Daily  . sodium chloride flush  3 mL Intravenous Q12H  . torsemide  40 mg Oral Daily  . traZODone  100 mg Oral QHS   sodium chloride, acetaminophen, albuterol, alum & mag hydroxide-simeth **AND** lidocaine, bisacodyl, calcium carbonate, hydrOXYzine, loperamide, ondansetron (ZOFRAN) IV, oxyCODONE-acetaminophen, sodium chloride flush  Assessment/ Plan:  [name] is a 64 y.o. black female with congestive heart failure, hypertension, hyperlipidemia, gout, COPD, anemia, cocaine abuse who is admitted to [facility] on 10/19/2019 for Peripheral edema [R60.9] Acute CHF (congestive heart failure) ([place]) [I50.9] AKI (acute kidney injury) ([place]) [N17.9] Congestive heart failure, unspecified HF chronicity, unspecified heart failure type ([place]) [K59.9] Acute diastolic (congestive) heart failure ([place]) [I50.31]  1. Acute kidney injury on chronic kidney disease stage IIIB.  Patient continues to have significant renal dysfunction.  Challenging social situation.  No further dialysis pursued per patient desire.   Lab  Results  Component Value Date   CREATININE 3.25 (H) 11/23/2019   CREATININE 3.24 (H) 11/23/2019   CREATININE 3.71 (H) 11/20/2019  No labs today Will continue monitoring renal function  2. Hypotension with acute exacerbation of systolic and diastolic congestive heart failure. Echocardiogram 06/22/19 EF of 25-30%.  Blood pressure readings within low normal range, 108/92 today Continue  Midodrine  3. Anemia of Chronic Disease Patient's hemoglobin levels stay low,but relatively stable Will continue monitoring CBCs   LOS: [address]ately[address] 11/16/202110:27 AM

## 2019-11-25 DIAGNOSIS — K652 Spontaneous bacterial peritonitis: Secondary | ICD-10-CM | POA: Diagnosis not present

## 2019-11-25 LAB — RENAL FUNCTION PANEL
Albumin: 2.4 g/dL — ABNORMAL LOW (ref 3.5–5.0)
Anion gap: 14 (ref 5–15)
BUN: 63 mg/dL — ABNORMAL HIGH (ref 8–23)
CO2: 26 mmol/L (ref 22–32)
Calcium: 9.2 mg/dL (ref 8.9–10.3)
Chloride: 94 mmol/L — ABNORMAL LOW (ref 98–111)
Creatinine, Ser: 3.17 mg/dL — ABNORMAL HIGH (ref 0.44–1.00)
GFR, Estimated: 16 mL/min — ABNORMAL LOW (ref >60)
Glucose, Bld: 86 mg/dL (ref 70–99)
Phosphorus: 3.5 mg/dL (ref 2.5–4.6)
Potassium: 3.9 mmol/L (ref 3.5–5.1)
Sodium: 134 mmol/L — ABNORMAL LOW (ref 135–145)

## 2019-11-25 NOTE — Plan of Care (Signed)
  Problem: Clinical Measurements: Goal: Ability to maintain clinical measurements within normal limits will improve Outcome: Progressing Goal: Will remain free from infection Outcome: Progressing   Problem: Nutrition: Goal: Adequate nutrition will be maintained Outcome: Progressing   

## 2019-11-25 NOTE — Progress Notes (Signed)
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  After admission to the hospital, she was diagnosed with acute on chronic combinedsystolic and diastolic congestive heart failurewith ejection fraction 25 to 30%. She was not responding to IV Lasix. She was seen by nephrology, temporary dialysis catheter was performed and she was dialyzed on 10/17 and 10/18.Patient also complaining of abdominal pain, she had a paracentesis, study showed possible spontaneous peritonitis, she was treated with Rocephin and Flagyl.  Patient had a worsening respiratory status on 10/17, was diagnosed with aspiration pneumonia. She was treated with Rocephin, Zithromax and Flagyl. Patient also had abdominal pain and a CT scan suspect ischemia, but seen by general surgery on 10/19, no need for surgery.  Patient was evaluated by psychiatry and she does not has decision-making capacity. Family does not want to participate in making decisions for her. DSS case was filed for guardianship and still pending. Difficult disposition due to unsafe discharge.  Assessment & Plan:  Spontaneous bacterial peritonitis: Resolved.  Repeat paracentesis showed significant decrease of white cell in ascites, antibiotics effective. However she still has a lot of neutrophils in 11/03/2019 fluid. Culture has been negative.Pt received many days ofRocephin, sincediscontinued.  -Continue cipro for ppx   Acute on chronic combined systolic and diastolic congestive heart  failure. Acute hypoxemic respiratory failure. -Echo from 06/22/2019 showed ejection fraction of 25 to 30% with grade 3 diastolic dysfunction. She was dialyzed x2, but now getting diuresed. Pt reported does not want to get dialysis again. - repeat CXR with improved edema 11/7 -nephrology c/s, appreciate recs was started on- torsemide 40 mg daily -Saturating well on her baseline oxygen requirement.  Liver cirrhosis with ascites:Noted. GI consulted previously in hospitalization (see prior notes). -Continue cipro for SBP ppx  Dependent edema: -Patient has lower abdominal, lower back wall & leg edema-multifactorial in the setting of cirrhosis, CHF, renal failure & debility -Creatinine/GFR: Stable.  No respiratory symptoms.  Patient seems comfortable. -Consulted PT  Hypotension: Remained stable.  Continue midodrine 10 mg TID.    Acute renal failure on chronic kidney disease stage IIIb: baseline creatinine appears to be 1.66. Creatinine peaked at 4.16, currently stable at 3.17 today -No plan for further dialysis per renal. Pt reportedly does not want dialysis again -renal transitioned to torsemide 40 mg-continue same.  Hyponatremia: Seems improving, sodium at 134 today. Patient also takes torsemide.  -Continue to monitor.  She is asymptomatic.  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 However, there is no GI bleed observed while in the hospital. It is not safe to continue hold anticoagulation. Anticoagulation has now been restarted with eliquis.  -Monitor hemoglobin. -Continue Eliquis  Overgrown toenails: will need outpatient podiatry follow up  Anemia of chronic disease:  -In the setting of CKD.  H&H is stable between 7-8.  MCV: 71.2.  -cont iron supplement -No indication of transfusion at this time.  Chronic thrombocytosis: Platelet:  522-->441-->430. Improving.  Hypokalemia:Resolved.  GERD: Continue PPI   Depression/insomnia: Continue trazodone  Vaginal Itching: Denies vaginal discharge.  Concern about fungal infection  in the setting of chronic antibiotics.  Refused exam.   -Cont.  ketoconazole cream twice daily for 2 more days. -Her symptoms resolved as per patient  Chronic constipation: Cont. MiraLAX.  Morbid obesity. Body mass index is 45.68 kg/m.  Goals of care: appreciate palliative care involvement.  Complex situation overall.  She's now DNR with no plans for dialysis.  GOC are often difficult due to her orientation and limited insight at times, but she's been relatively consistent in her overall goals (no dialysis or heroic measures, consistent with her DNR).  Limited insight at times makes discharge planning more complex.  She was seen earlier by psych on 10/21 who noted that "she is mostly unable to make decisions for herself safely about her overall treatment". On 11/8, she attempted to leave against medical advice.  No safe DC plan was identified at that time.  Per Dr. Weber Cooks, she does not have capacity for safe decisions about living situation.  Overall a difficult situation, DSS working with [facility] team as well. Family does not want to take decisions for her.  DVT prophylaxis: Eliquis Code Status: DNR Family Communication: Discussed with patient.  Disposition Plan: await SNF placement  Consultants:  Palliative Nephrology GI  Procedures:  10/20 perma cath insertion perma cath removal 11/5  Antimicrobials:   As per chart  Status is: Inpatient  Dispo: The patient is from: home              Anticipated d/c is to: Pending              Anticipated d/c date is: >3 days              Patient currently medically stable for the discharge  Subjective:  Patient has no new complaint today. Sitter was at bedside. According to sitter she is not showing any signs of agitation.  Objective: Vitals:    11/25/19 0500 11/25/19 0759 11/25/19 1125 11/25/19 1515  BP:  (!) 116/95 111/86 100/68  Pulse:  89 80 74  Resp:  17 17 16   Temp:  98.1 F (36.7 C) 97.9 F (36.6 C) 98 F (36.7 C)  TempSrc:  Oral Oral Oral  SpO2:  100% 97% 100%  Weight: 128.4 kg     Height:        Intake/Output Summary (Last 24 hours) at 11/25/2019 1520 Last data filed at 11/25/2019 1002 Gross per 24 hour  Intake 0 ml  Output --  Net 0 ml   Filed Weights   11/23/19 0500 11/24/19 0642 11/25/19 0500  Weight: 106.8 kg 104.3 kg 128.4 kg    Examination:  General. Morbidly obese lady, in no acute distress. Pulmonary.  Lungs clear bilaterally, normal respiratory effort. CV.  Regular rate and rhythm, +ve murmur. Abdomen.  Soft, nontender, nondistended, BS positive. CNS.  Alert and oriented x3.  No focal neurologic deficit. Extremities.  No edema, no cyanosis, pulses intact and symmetrical. Psychiatry.  Judgment and insight appears normal.  Data Reviewed: I have personally reviewed following labs and imaging studies  CBC: Recent Labs  Lab 11/20/19 0652 11/23/19 0422  WBC 9.9 7.7  HGB 7.4* 7.6*  HCT 23.2* 23.8*  MCV 71.2* 70.8*  PLT 441* 494*   Basic Metabolic Panel: Recent Labs  Lab 11/20/19 0652 11/23/19 0422 11/25/19 0924  NA 131* 133* 134*  K 4.2 3.7 3.9  CL 95* 96* 94*  CO2 25 24 26   GLUCOSE 107* 88 86  BUN 59* 65* 63*  CREATININE 3.71* 3.24*  3.25* 3.17*  CALCIUM 8.8* 8.9 9.2  PHOS  --   --  3.5   GFR: Estimated Creatinine Clearance: 24.9 mL/min (A) (by C-G formula based on SCr of 3.17 mg/dL (H)). Liver Function Tests: Recent Labs  Lab 11/23/19 0422 11/25/19 0924  AST 19  --   ALT 9  --   ALKPHOS 248*  --   BILITOT 1.0  --   PROT 6.0*  --   ALBUMIN 2.2* 2.4*   No results for input(s): LIPASE, AMYLASE in the last 168 hours. No results for input(s): AMMONIA in the last 168 hours. Coagulation Profile: No results for input(s): INR, PROTIME in the last 168 hours. Cardiac  Enzymes: No results for input(s): CKTOTAL, CKMB, CKMBINDEX, TROPONINI in the last 168 hours. BNP (last 3 results) No results for input(s): PROBNP in the last 8760 hours. HbA1C: No results for input(s): HGBA1C in the last 72 hours. CBG: No results for input(s): GLUCAP in the last 168 hours. Lipid Profile: No results for input(s): CHOL, HDL, LDLCALC, TRIG, CHOLHDL, LDLDIRECT in the last 72 hours. Thyroid Function Tests: No results for input(s): TSH, T4TOTAL, FREET4, T3FREE, THYROIDAB in the last 72 hours. Anemia Panel: No results for input(s): VITAMINB12, FOLATE, FERRITIN, TIBC, IRON, RETICCTPCT in the last 72 hours. Sepsis Labs: No results for input(s): PROCALCITON, LATICACIDVEN in the last 168 hours.  No results found for this or any previous visit (from the past 240 hour(s)).    Radiology Studies: No results found.  Scheduled Meds: . apixaban  5 mg Oral BID  . ciprofloxacin  500 mg Oral Q breakfast  . iron polysaccharides  150 mg Oral Daily  . ketoconazole   Topical BID  . LORazepam  1 mg Intramuscular Once  . mouth rinse  15 mL Mouth Rinse BID  . melatonin  2.5 mg Oral QHS  . midodrine  10 mg Oral TID WC  . multivitamin  1 tablet Oral QHS  . pantoprazole  40 mg Oral BID  . polyethylene glycol  17 g Oral Daily  . sodium chloride flush  3 mL Intravenous Q12H  . torsemide  40 mg Oral Daily  . traZODone  100 mg Oral QHS   Continuous Infusions: . sodium chloride Stopped (11/01/19 1131)     LOS: 37 days   Time spent: 35 minutes  Lorella Nimrod, MD [facility]  If 7PM-7AM, please contact night-coverage [email or web address] 11/25/2019, 3:20 PM

## 2019-11-25 NOTE — TOC Progression Note (Signed)
Transition of Care [facility]) - Progression Note    Patient Details  Name: Katelyn Lane MRN: [id number] Date of Birth: 1955/08/03  Transition of Care [facility]) CM/SW [place], RN Phone Number: 11/25/2019, 8:44 AM  Clinical Narrative:   RNCM reached out to [place] and re-submitted APS referral, due to lack of capacity.          Expected Discharge Plan and Services                                                 Social Determinants of Health (SDOH) Interventions    Readmission Risk Interventions Readmission Risk Prevention Plan 09/02/2019 08/14/2019 07/27/2019  Transportation Screening Complete Complete Complete  Medication Review [employer]) Complete Referral to Pharmacy -  PCP or Specialist appointment within 3-5 days of discharge Complete Complete Complete  HRI or Home Care Consult - Complete Complete  SW Recovery Care/Counseling Consult - Complete Complete  Palliative Care Screening Not Applicable Not Applicable Not Applicable  Skilled Nursing Facility Complete Not Applicable Not Applicable  Some recent data might be hidden

## 2019-11-25 NOTE — TOC Progression Note (Signed)
Transition of Care [facility]) - Progression Note    Patient Details  Name: Katelyn Lane MRN: [id number] Date of Birth: 18-Oct-1955  Transition of Care [facility]) CM/SW [place], RN Phone Number: 11/25/2019, 3:53 PM  Clinical Narrative:   Raymond Gurney from [place] stopped by to speak with patient regarding possibly agreeing to facility placement which was not successful. She reported that the case has been given over to a guardianship Education officer, museum to review.          Expected Discharge Plan and Services                                                 Social Determinants of Health (SDOH) Interventions    Readmission Risk Interventions Readmission Risk Prevention Plan 09/02/2019 08/14/2019 07/27/2019  Transportation Screening Complete Complete Complete  Medication Review Press photographer) Complete Referral to Pharmacy -  PCP or Specialist appointment within 3-5 days of discharge Complete Complete Complete  HRI or Home Care Consult - Complete Complete  SW Recovery Care/Counseling Consult - Complete Complete  Palliative Care Screening Not Applicable Not Applicable Not Applicable  Skilled Nursing Facility Complete Not Applicable Not Applicable  Some recent data might be hidden

## 2019-11-25 NOTE — Plan of Care (Signed)
  Problem: Education: Goal: Knowledge of General Education information will improve Description Including pain rating scale, medication(s)/side effects and non-pharmacologic comfort measures Outcome: Progressing   Problem: Health Behavior/Discharge Planning: Goal: Ability to manage health-related needs will improve Outcome: Progressing   

## 2019-11-26 ENCOUNTER — Ambulatory Visit: Payer: Medicaid Other | Admitting: Family

## 2019-11-26 DIAGNOSIS — K652 Spontaneous bacterial peritonitis: Secondary | ICD-10-CM | POA: Diagnosis not present

## 2019-11-26 LAB — RENAL FUNCTION PANEL
Albumin: 2.4 g/dL — ABNORMAL LOW (ref 3.5–5.0)
Anion gap: 17 — ABNORMAL HIGH (ref 5–15)
BUN: 62 mg/dL — ABNORMAL HIGH (ref 8–23)
CO2: 23 mmol/L (ref 22–32)
Calcium: 9.5 mg/dL (ref 8.9–10.3)
Chloride: 95 mmol/L — ABNORMAL LOW (ref 98–111)
Creatinine, Ser: 3.15 mg/dL — ABNORMAL HIGH (ref 0.44–1.00)
GFR, Estimated: 16 mL/min — ABNORMAL LOW (ref >60)
Glucose, Bld: 90 mg/dL (ref 70–99)
Phosphorus: 3.6 mg/dL (ref 2.5–4.6)
Potassium: 4 mmol/L (ref 3.5–5.1)
Sodium: 135 mmol/L (ref 135–145)

## 2019-11-26 MED ORDER — EPOETIN ALFA 10000 UNIT/ML IJ SOLN
20000.0000 [IU] | INTRAMUSCULAR | Status: DC
  Administered 2019-11-26 – 2020-01-07 (×6): 20000 [IU] via SUBCUTANEOUS
  Filled 2019-11-26 (×13): qty 2

## 2019-11-26 NOTE — Progress Notes (Signed)
[facility]  ROUNDING NOTE   Subjective:   Patient in no acute distress, stays very interactive, reports consuming breakfast today, with fair appetite.She denies SOB, not on any supplemental O2 at this time.    Objective:  Vital signs in last 24 hours:  Temp:  [97.7 F (36.5 C)-98.7 F (37.1 C)] 97.8 F (36.6 C) (11/18 0753) Pulse Rate:  [74-91] 91 (11/18 0753) Resp:  [15-18] 15 (11/18 0753) BP: (100-118)/(68-94) 109/88 (11/18 0753) SpO2:  [95 %-100 %] 98 % (11/18 0753)  Weight change:  Filed Weights   11/23/19 0500 11/24/19 0642 11/25/19 0500  Weight: 106.8 kg 104.3 kg 128.4 kg    Intake/Output: No intake/output data recorded.   Intake/Output this shift:  Total I/O In: 120 [P.O.:120] Out: -   Physical Exam: General:  Sitting up in bed,pleasant and very interactive  Head  Normocephalic,atraumatic  Lungs:   Lungs diminished at the bases  Heart: Regular rate and rhythm  Abdomen:   Non distended, nontender  Extremities:  1+peripheral edema  Neurologic:  Answers simple questions appropriately,very talkative, irrelevant talk at times  Skin: No acute lesions or rashes    Basic Metabolic Panel: Recent Labs  Lab 11/20/19 0652 11/20/19 0652 11/23/19 0422 11/25/19 0924 11/26/19 0834  NA 131*  --  133* 134* 135  K 4.2  --  3.7 3.9 4.0  CL 95*  --  96* 94* 95*  CO2 25  --  24 26 23   GLUCOSE 107*  --  88 86 90  BUN 59*  --  65* 63* 62*  CREATININE 3.71*  --  3.24*  3.25* 3.17* 3.15*  CALCIUM 8.8*   < > 8.9 9.2 9.5  PHOS  --   --   --  3.5 3.6   < > = values in this interval not displayed.    Liver Function Tests: Recent Labs  Lab 11/23/19 0422 11/25/19 0924 11/26/19 0834  AST 19  --   --   ALT 9  --   --   ALKPHOS 248*  --   --   BILITOT 1.0  --   --   PROT 6.0*  --   --   ALBUMIN 2.2* 2.4* 2.4*   No results for input(s): LIPASE, AMYLASE in the last 168 hours. No results for input(s): AMMONIA in the last 168 hours.  CBC: Recent Labs   Lab 11/20/19 0652 11/23/19 0422  WBC 9.9 7.7  HGB 7.4* 7.6*  HCT 23.2* 23.8*  MCV 71.2* 70.8*  PLT 441* 430*    Cardiac Enzymes: No results for input(s): CKTOTAL, CKMB, CKMBINDEX, TROPONINI in the last 168 hours.  BNP: Invalid input(s): POCBNP  CBG: No results for input(s): GLUCAP in the last 168 hours.  Microbiology: Results for orders placed or performed during the hospital encounter of 10/19/19  Respiratory Panel by RT PCR (Flu A&B, Covid) - Nasopharyngeal Swab     Status: None   Collection Time: 10/19/19 11:13 PM   Specimen: Nasopharyngeal Swab  Result Value Ref Range Status   SARS Coronavirus 2 by RT PCR NEGATIVE NEGATIVE Final    Comment: (NOTE) SARS-CoV-2 target nucleic acids are NOT DETECTED.  The SARS-CoV-2 RNA is generally detectable in upper respiratoy specimens during the acute phase of infection. The lowest concentration of SARS-CoV-2 viral copies this assay can detect is 131 copies/mL. A negative result does not preclude SARS-Cov-2 infection and should not be used as the sole basis for treatment or other patient management decisions. A negative  result may occur with  improper specimen collection/handling, submission of specimen other than nasopharyngeal swab, presence of viral mutation(s) within the areas targeted by this assay, and inadequate number of viral copies (<131 copies/mL). A negative result must be combined with clinical observations, patient history, and epidemiological information. The expected result is Negative.  Fact Sheet for Patients:  PinkCheek.be  Fact Sheet for Healthcare Providers:  GravelBags.it  This test is no t yet approved or cleared by the [place] FDA and  has been authorized for detection and/or diagnosis of SARS-CoV-2 by FDA under an Emergency Use Authorization (EUA). This EUA will remain  in effect (meaning this test can be used) for the duration of  the COVID-19 declaration under Section 564(b)(1) of the Act, 21 U.S.C. section 360bbb-3(b)(1), unless the authorization is terminated or revoked sooner.     Influenza A by PCR NEGATIVE NEGATIVE Final   Influenza B by PCR NEGATIVE NEGATIVE Final    Comment: (NOTE) The Xpert Xpress SARS-CoV-2/FLU/RSV assay is intended as an aid in  the diagnosis of influenza from Nasopharyngeal swab specimens and  should not be used as a sole basis for treatment. Nasal washings and  aspirates are unacceptable for Xpert Xpress SARS-CoV-2/FLU/RSV  testing.  Fact Sheet for Patients: PinkCheek.be  Fact Sheet for Healthcare Providers: GravelBags.it  This test is not yet approved or cleared by the [place] FDA and  has been authorized for detection and/or diagnosis of SARS-CoV-2 by  FDA under an Emergency Use Authorization (EUA). This EUA will remain  in effect (meaning this test can be used) for the duration of the  Covid-19 declaration under Section 564(b)(1) of the Act, 21  U.S.C. section 360bbb-3(b)(1), unless the authorization is  terminated or revoked. Performed at [facility], [address] [place], [place] [address]   MRSA PCR Screening     Status: None   Collection Time: 10/25/19  2:50 PM   Specimen: Nasopharyngeal  Result Value Ref Range Status   MRSA by PCR NEGATIVE NEGATIVE Final    Comment:        The GeneXpert MRSA Assay (FDA approved for NASAL specimens only), is one component of a comprehensive MRSA colonization surveillance program. It is not intended to diagnose MRSA infection nor to guide or monitor treatment for MRSA infections. Performed at [facility], [address] [place], [place] [address]   CULTURE, BLOOD (ROUTINE X 2) w Reflex to ID Panel     Status: None   Collection Time: 10/25/19  4:29 PM   Specimen: BLOOD  Result Value Ref Range Status   Specimen Description BLOOD CENTRAL  LINE  Final   Special Requests   Final    BOTTLES DRAWN AEROBIC AND ANAEROBIC Blood Culture adequate volume   Culture   Final    NO GROWTH 5 DAYS Performed at [facility] [address] [place], [place] [address]    Report Status 10/30/2019 FINAL  Final  CULTURE, BLOOD (ROUTINE X 2) w Reflex to ID Panel     Status: None   Collection Time: 10/26/19  6:45 AM   Specimen: BLOOD  Result Value Ref Range Status   Specimen Description BLOOD BLOOD LEFT HAND  Final   Special Requests   Final    BOTTLES DRAWN AEROBIC AND ANAEROBIC Blood Culture adequate volume   Culture   Final    NO GROWTH 5 DAYS Performed at [facility], [address], [place], [place] [address]    Report Status  10/31/2019 FINAL  Final  Body fluid culture     Status: None   Collection Time: 10/26/19 11:30 AM   Specimen: Fluid  Result Value Ref Range Status   Specimen Description   Final    FLUID Performed at [facility], [address] [place], [place] [address]    Special Requests   Final    PERITONEAL Performed at [facility], [address] [place], [place] [address]    Gram Stain   Final    ABUNDANT WBC PRESENT, PREDOMINANTLY PMN NO ORGANISMS SEEN    Culture   Final    NO GROWTH Performed at [facility], [place] [address] [place], [place] [address]    Report Status 10/30/2019 FINAL  Final  Body fluid culture     Status: None   Collection Time: 10/29/19  1:39 PM   Specimen: PATH Cytology Peritoneal fluid  Result Value Ref Range Status   Specimen Description   Final    PERITONEAL Performed at [facility], [address] [place], [place] [address]    Special Requests   Final    NONE Performed at [facility], [address] [place], [place] [address]    Gram Stain   Final    MODERATE WBC PRESENT, PREDOMINANTLY PMN NO ORGANISMS SEEN    Culture   Final    NO GROWTH 3 DAYS Performed at [facility], [place] [address],  [place], [place] [address]    Report Status 11/02/2019 FINAL  Final  Gram stain     Status: None   Collection Time: 11/02/19  3:30 PM   Specimen: PATH Cytology Peritoneal fluid  Result Value Ref Range Status   Specimen Description PERITONEAL  Final   Special Requests NONE  Final   Gram Stain   Final    WBC SEEN RED BLOOD CELLS NO ORGANISMS SEEN Performed at [facility], [address], [place], [place] [address]    Report Status 11/02/2019 FINAL  Final    Coagulation Studies: No results for input(s): LABPROT, INR in the last 72 hours.  Urinalysis: No results for input(s): COLORURINE, LABSPEC, PHURINE, GLUCOSEU, HGBUR, BILIRUBINUR, KETONESUR, PROTEINUR, UROBILINOGEN, NITRITE, LEUKOCYTESUR in the last 72 hours.  Invalid input(s): APPERANCEUR    Imaging: No results found.   Medications:   . sodium chloride Stopped (11/01/19 1131)   . apixaban  5 mg Oral BID  . ciprofloxacin  500 mg Oral Q breakfast  . iron polysaccharides  150 mg Oral Daily  . ketoconazole   Topical BID  . LORazepam  1 mg Intramuscular Once  . mouth rinse  15 mL Mouth Rinse BID  . melatonin  2.5 mg Oral QHS  . midodrine  10 mg Oral TID WC  . multivitamin  1 tablet Oral QHS  . pantoprazole  40 mg Oral BID  . polyethylene glycol  17 g Oral Daily  . sodium chloride flush  3 mL Intravenous Q12H  . torsemide  40 mg Oral Daily  . traZODone  100 mg Oral QHS   sodium chloride, acetaminophen, albuterol, alum & mag hydroxide-simeth **AND** lidocaine, bisacodyl, calcium carbonate, hydrOXYzine, loperamide, ondansetron (ZOFRAN) IV, oxyCODONE-acetaminophen, sodium chloride flush  Assessment/ Plan:  [name] is a 64 y.o. black female with congestive heart failure, hypertension, hyperlipidemia, gout, COPD, anemia, cocaine abuse who is admitted to [facility] on 10/19/2019 for Peripheral edema [R60.9] Acute CHF (congestive heart failure) ([place]) [I50.9] AKI (acute kidney injury) ([place]) [N17.9]  Congestive heart  failure, unspecified HF chronicity, unspecified heart failure type ([place]) [X67.2] Acute diastolic (congestive) heart failure ([place]) [I50.31]  1. Acute kidney injury on chronic kidney disease stage IIIB.     Lab Results  Component Value Date   CREATININE 3.15 (H) 11/26/2019   CREATININE 3.17 (H) 11/25/2019   CREATININE 3.25 (H) 11/23/2019   CREATININE 3.24 (H) 11/23/2019  Creatinine levels stay high, but relatively stable Patient does not want to resume dialysis  Will continue monitoring renal function On Torsemide 40 mg daily  2. Hypotension with acute exacerbation of systolic and diastolic congestive heart failure. Echocardiogram 06/22/19 EF of 25-30%. Continues to be on Midodrine 10 mg TID Blood pressure readings within acceptable range  3. Anemia of Chronic Disease Hemoglobin 7.6 today Stays low, but stable. No active bleeding reported   LOS: [address] 11/18/202111:12 AM

## 2019-11-26 NOTE — Progress Notes (Signed)
Physical Therapy Treatment Patient Details Name: Katelyn Lane MRN: [id number] DOB: 02-27-1955 Today's Date: 11/26/2019    History of Present Illness Pt is a 64 yo female who presented to ER secondary to LE edema, progressive SOB; admitted for management of acute/chronic CHF, spontaneous bacterial peritonitis (s/p paracentesis 10/25).  Hospital course also significant for initiation of dialysis (initially, R temp fem cath; converted to R permcath).  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    PT Comments    Pt was pleasant during the session.  Pt remained very particular in how she wanted things done during the session but put forth very good effort and was motivated to progress towards goals.  Pt participated in transfer training to/from various height surfaces and presented with good control and stability throughout.  Pt actively participated in therex per below and was motivated to increase her ambulation distance this session.  Pt fatigued after amb 180' and required a therapeutic rest break before ambulating a second time.  Pt's SpO2 on room air dropped to a low of 89% but quickly increased back to the low 90s with rest.  Pt will benefit from HHPT services with 24/hr supervision upon discharge to safely address deficits listed in patient problem list for decreased caregiver assistance and eventual return to PLOF.     Follow Up Recommendations  Home health PT;Supervision/Assistance - 24 hour     Equipment Recommendations  Rolling walker with 5" wheels;3in1 (PT)    Recommendations for Other Services        Precautions / Restrictions Precautions Precautions: Fall Restrictions Weight Bearing Restrictions: No Other Position/Activity Restrictions: Impulsive, self directs care, very easily agitated    Mobility  Bed Mobility Overal bed mobility: Needs Assistance Bed Mobility: Supine to Sit;Sit to Supine     Supine to sit: Supervision Sit to supine: Supervision   General bed mobility comments: Extra time and effort and use of bed rail required  Transfers Overall transfer level: Needs assistance Equipment used: Rolling walker (2 wheeled) Transfers: Sit to/from Stand and stand pivot transfer training  Sit to Stand: Supervision;From elevated surface         General transfer comment: Extra effort to stand from an elevated EOB but with no LOB  Ambulation/Gait Ambulation/Gait assistance: Min guard;Supervision Gait Distance (Feet): 180 Feet x 1, 70 feet x 1 Assistive device: Rolling walker (2 wheeled) Gait Pattern/deviations: Step-through pattern;Trunk flexed;Decreased step length - right;Decreased step length - left Gait velocity: decreased   General Gait Details: Slow cadence but steady without LOB with SpO2 ranging from 89-92% on room air; no adverse symptoms reported other than fatigue   Stairs             Wheelchair Mobility    Modified Rankin (Stroke Patients Only)       Balance Overall balance assessment: Needs assistance Sitting-balance support: Feet supported;No upper extremity supported Sitting balance-Leahy Scale: Good     Standing balance support: Bilateral upper extremity supported;During functional activity Standing balance-Leahy Scale: Fair                              Cognition Arousal/Alertness: Awake/alert Behavior During Therapy: Impulsive Overall Cognitive Status: Within  Functional Limits for tasks assessed                                        Exercises Total Joint Exercises Ankle Circles/Pumps:  AROM;Strengthening;Both;5 reps;10 reps Hip ABduction/ADduction: AROM;Strengthening;Both;5 reps Straight Leg Raises: AROM;Strengthening;Both;5 reps Long Arc Quad: AROM;Strengthening;15 reps;Both Knee Flexion: AROM;Strengthening;Both;15 reps    General Comments        Pertinent Vitals/Pain Pain Assessment: No/denies pain    Home Living                      Prior Function            PT Goals (current goals can now be found in the care plan section) Progress towards PT goals: Progressing toward goals    Frequency    Min 2X/week      PT Plan Discharge plan needs to be updated    Co-evaluation              AM-PAC PT "6 Clicks" Mobility   Outcome Measure  Help needed turning from your back to your side while in a flat bed without using bedrails?: A Little Help needed moving from lying on your back to sitting on the side of a flat bed without using bedrails?: A Little Help needed moving to and from a bed to a chair (including a wheelchair)?: A Little Help needed standing up from a chair using your arms (e.g., wheelchair or bedside chair)?: A Little Help needed to walk in hospital room?: A Little Help needed climbing 3-5 steps with a railing? : A Little 6 Click Score: 18    End of Session Equipment Utilized During Treatment: Gait belt Activity Tolerance: Patient tolerated treatment well Patient left: in bed;with call bell/phone within reach;with nursing/sitter in room;Other (comment) (all 4 bed rails up per patient request) Nurse Communication: Mobility status PT Visit Diagnosis: Muscle weakness (generalized) (M62.81);Difficulty in walking, not elsewhere classified (R26.2)     Time: 6759-1638 PT Time Calculation (min) (ACUTE ONLY): 44 min  Charges:  $Gait Training: 23-37 mins $Therapeutic Exercise: 8-22 mins                     D. Scott Lannette Avellino PT, DPT 11/26/19, 4:03 PM

## 2019-11-26 NOTE — Progress Notes (Signed)
PROGRESS NOTE    Katelyn Lane  GYF:[id number] DOB: 20-Sep-1955 DOA: 10/19/2019 PCP: [name], MD   Brief Narrative:   Katelyn Lane a 64 y.o.femalewith medical history significant forcombined diastolic and systolic heart failure, CKD stage IIIb, history of CVA, hypertension, hyperlipidemia, ventricular thrombus on Xarelto and cocaine use who presents with concerns of lower extremity edema and increasing shortness of breath.Found to have BNP greater than 4500, elevated troponin and AKI.  After admission to the hospital, she was diagnosed with acute on chronic combinedsystolic and diastolic congestive heart failurewith ejection fraction 25 to 30%. She was not responding to IV Lasix. She was seen by nephrology, temporary dialysis catheter was performed and she was dialyzed on 10/17 and 10/18.Patient also complaining of abdominal pain, she had a paracentesis, study showed possible spontaneous peritonitis, she was treated with Rocephin and Flagyl.  Patient had a worsening respiratory status on 10/17, was diagnosed with aspiration pneumonia. She was treated with Rocephin, Zithromax and Flagyl. Patient also had abdominal pain and a CT scan suspect ischemia, but seen by general surgery on 10/19, no need for surgery.  Patient was evaluated by psychiatry and she does not has decision-making capacity. Family does not want to participate in making decisions for her. DSS case was filed for guardianship and still pending. Difficult disposition due to unsafe discharge.  Assessment & Plan:  Spontaneous bacterial peritonitis: Resolved.  Repeat paracentesis showed significant decrease of white cell in ascites, antibiotics effective. However she still has a lot of neutrophils in 11/03/2019 fluid. Culture has been negative.Pt received many days ofRocephin, sincediscontinued.  -Continue cipro for ppx   Acute on chronic combined systolic and diastolic congestive heart  failure. Acute hypoxemic respiratory failure. -Echo from 06/22/2019 showed ejection fraction of 25 to 30% with grade 3 diastolic dysfunction. She was dialyzed x2, but now getting diuresed. Pt reported does not want to get dialysis again. - repeat CXR with improved edema 11/7 -nephrology c/s, appreciate recs was started on- torsemide 40 mg daily -Saturating well on her baseline oxygen requirement.  Liver cirrhosis with ascites:Noted. GI consulted previously in hospitalization (see prior notes). -Continue cipro for SBP ppx  Dependent edema: -Patient has lower abdominal, lower back wall & leg edema-multifactorial in the setting of cirrhosis, CHF, renal failure & debility -Creatinine/GFR: Stable.  No respiratory symptoms.  Patient seems comfortable. -Continue with torsemide.  Hypotension: Remained stable.  Continue midodrine 10 mg TID.    Acute renal failure on chronic kidney disease stage IIIb: baseline creatinine appears to be 1.66. Creatinine peaked at 4.16, currently stable at 3.15 today -No plan for further dialysis per renal. Pt reportedly does not want dialysis again -renal transitioned to torsemide 40 mg-continue same.  Hyponatremia: Resolved. Sodium at 135 today.   -Continue to monitor.  She is asymptomatic.  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 However, there is no GI bleed observed while in the hospital. It is not safe to continue hold anticoagulation. Anticoagulation has now been restarted with eliquis.  -Monitor hemoglobin. -Continue Eliquis  Overgrown toenails: will need outpatient podiatry follow up  Anemia of chronic disease:  -In the setting of CKD.  H&H is stable between 7-8.  MCV: 71.2.  -cont iron supplement -No indication of transfusion at this time. -EPO was started by nephrology today.  Chronic thrombocytosis: Platelet:  522-->441-->430. Improving.  Hypokalemia:Resolved.  GERD: Continue PPI   Depression/insomnia: Continue trazodone  Vaginal Itching: Denies vaginal discharge.  Concern  about fungal infection in the setting of chronic antibiotics.  Refused exam.   -Cont.  ketoconazole cream twice daily for 2 more days. -Her symptoms resolved as per patient  Chronic constipation: Cont. MiraLAX.  Morbid obesity. Body mass index is 45.68 kg/m.  Goals of care: appreciate palliative care involvement.  Complex situation overall.  She's now DNR with no plans for dialysis.  GOC are often difficult due to her orientation and limited insight at times, but she's been relatively consistent in her overall goals (no dialysis or heroic measures, consistent with her DNR).  Limited insight at times makes discharge planning more complex.  She was seen earlier by psych on 10/21 who noted that "she is mostly unable to make decisions for herself safely about her overall treatment". On 11/8, she attempted to leave against medical advice.  No safe DC plan was identified at that time.  Per Dr. Weber Cooks, she does not have capacity for safe decisions about living situation.  Overall a difficult situation, DSS working with [facility] team as well. Family does not want to take decisions for her.  DVT prophylaxis: Eliquis Code Status: DNR Family Communication: Discussed with patient.  Disposition Plan: await SNF placement  Consultants:  Palliative Nephrology GI  Procedures:  10/20 perma cath insertion perma cath removal 11/5  Antimicrobials:   As per chart  Status is: Inpatient  Dispo: The patient is from: home              Anticipated d/c is to: Pending              Anticipated d/c date is: >3 days              Patient currently medically stable for the discharge.  Subjective:  Patient was resting comfortably when seen today. No new complaint.  Objective: Vitals:   11/25/19 2331 11/26/19 0326 11/26/19 0753 11/26/19  1601  BP: 118/88 108/86 109/88 101/75  Pulse: 78 82 91 69  Resp: 18  15 17   Temp: 98.7 F (37.1 C) 97.7 F (36.5 C) 97.8 F (36.6 C)   TempSrc:  Oral Oral   SpO2: 95% 99% 98% 100%  Weight:      Height:        Intake/Output Summary (Last 24 hours) at 11/26/2019 1704 Last data filed at 11/26/2019 1356 Gross per 24 hour  Intake 120 ml  Output --  Net 120 ml   Filed Weights   11/23/19 0500 11/24/19 0642 11/25/19 0500  Weight: 106.8 kg 104.3 kg 128.4 kg    Examination:  General. Morbidly obese lady, in no acute distress. Pulmonary.  Lungs clear bilaterally, normal respiratory effort. CV.  Regular rate and rhythm, no JVD, rub or murmur. Abdomen.  Soft, nontender, nondistended, BS positive. CNS.  Alert and oriented x3.  No focal neurologic deficit. Extremities.  No edema, no cyanosis, pulses intact and symmetrical. Psychiatry.  Judgment and insight appears normal.  Data Reviewed: I have personally reviewed following labs and imaging studies  CBC: Recent Labs  Lab 11/20/19 0652 11/23/19 0422  WBC 9.9 7.7  HGB 7.4* 7.6*  HCT 23.2* 23.8*  MCV 71.2* 70.8*  PLT 441* 703*   Basic Metabolic Panel: Recent Labs  Lab 11/20/19 0652 11/23/19 0422 11/25/19 0924 11/26/19 0834  NA 131* 133* 134* 135  K 4.2 3.7 3.9 4.0  CL 95* 96* 94* 95*  CO2 25 24 26 23   GLUCOSE 107* 88 86 90  BUN 59* 65* 63* 62*  CREATININE 3.71* 3.24*  3.25* 3.17* 3.15*  CALCIUM 8.8* 8.9 9.2 9.5  PHOS  --   --  3.5 3.6   GFR: Estimated Creatinine Clearance: 25.1 mL/min (A) (by C-G formula based on SCr of 3.15 mg/dL (H)). Liver Function Tests: Recent Labs  Lab 11/23/19 0422 11/25/19 0924 11/26/19 0834  AST 19  --   --   ALT 9  --   --   ALKPHOS 248*  --   --   BILITOT 1.0  --   --   PROT 6.0*  --   --   ALBUMIN 2.2* 2.4* 2.4*   No results for input(s): LIPASE, AMYLASE in the last 168 hours. No results for input(s): AMMONIA in the last 168 hours. Coagulation Profile: No results for  input(s): INR, PROTIME in the last 168 hours. Cardiac Enzymes: No results for input(s): CKTOTAL, CKMB, CKMBINDEX, TROPONINI in the last 168 hours. BNP (last 3 results) No results for input(s): PROBNP in the last 8760 hours. HbA1C: No results for input(s): HGBA1C in the last 72 hours. CBG: No results for input(s): GLUCAP in the last 168 hours. Lipid Profile: No results for input(s): CHOL, HDL, LDLCALC, TRIG, CHOLHDL, LDLDIRECT in the last 72 hours. Thyroid Function Tests: No results for input(s): TSH, T4TOTAL, FREET4, T3FREE, THYROIDAB in the last 72 hours. Anemia Panel: No results for input(s): VITAMINB12, FOLATE, FERRITIN, TIBC, IRON, RETICCTPCT in the last 72 hours. Sepsis Labs: No results for input(s): PROCALCITON, LATICACIDVEN in the last 168 hours.  No results found for this or any previous visit (from the past 240 hour(s)).    Radiology Studies: No results found.  Scheduled Meds:  apixaban  5 mg Oral BID   ciprofloxacin  500 mg Oral Q breakfast   epoetin (EPOGEN/PROCRIT) injection  20,000 Units Subcutaneous Weekly   iron polysaccharides  150 mg Oral Daily   LORazepam  1 mg Intramuscular Once   mouth rinse  15 mL Mouth Rinse BID   melatonin  2.5 mg Oral QHS   midodrine  10 mg Oral TID WC   multivitamin  1 tablet Oral QHS   pantoprazole  40 mg Oral BID   polyethylene glycol  17 g Oral Daily   sodium chloride flush  3 mL Intravenous Q12H   torsemide  40 mg Oral Daily   traZODone  100 mg Oral QHS   Continuous Infusions:  sodium chloride Stopped (11/01/19 1131)     LOS: 38 days   Time spent: 25 minutes  Lorella Nimrod, MD [facility]  If 7PM-7AM, please contact night-coverage [email or web address] 11/26/2019, 5:04 PM

## 2019-11-27 DIAGNOSIS — K652 Spontaneous bacterial peritonitis: Secondary | ICD-10-CM | POA: Diagnosis not present

## 2019-11-27 LAB — CBC
HCT: 27.9 % — ABNORMAL LOW (ref 36.0–46.0)
Hemoglobin: 8.6 g/dL — ABNORMAL LOW (ref 12.0–15.0)
MCH: 22.2 pg — ABNORMAL LOW (ref 26.0–34.0)
MCHC: 30.8 g/dL (ref 30.0–36.0)
MCV: 71.9 fL — ABNORMAL LOW (ref 80.0–100.0)
Platelets: 476 10*3/uL — ABNORMAL HIGH (ref 150–400)
RBC: 3.88 MIL/uL (ref 3.87–5.11)
RDW: 20.5 % — ABNORMAL HIGH (ref 11.5–15.5)
WBC: 6.5 10*3/uL (ref 4.0–10.5)
nRBC: 0 % (ref 0.0–0.2)

## 2019-11-27 NOTE — Progress Notes (Signed)
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  After admission to the hospital, she was diagnosed with acute on chronic combinedsystolic and diastolic congestive heart failurewith ejection fraction 25 to 30%. She was not responding to IV Lasix. She was seen by nephrology, temporary dialysis catheter was performed and she was dialyzed on 10/17 and 10/18.Patient also complaining of abdominal pain, she had a paracentesis, study showed possible spontaneous peritonitis, she was treated with Rocephin and Flagyl.  Patient had a worsening respiratory status on 10/17, was diagnosed with aspiration pneumonia. She was treated with Rocephin, Zithromax and Flagyl. Patient also had abdominal pain and a CT scan suspect ischemia, but seen by general surgery on 10/19, no need for surgery.  Patient was evaluated by psychiatry and she does not has decision-making capacity. Family does not want to participate in making decisions for her. DSS case was filed for guardianship and still pending. Difficult disposition due to unsafe discharge.  Assessment & Plan:  Spontaneous bacterial peritonitis: Resolved.  Repeat paracentesis showed significant decrease of white cell in ascites, antibiotics effective. However she still has a lot of neutrophils in 11/03/2019 fluid. Culture has been negative.Pt received many days ofRocephin, sincediscontinued.  -Continue cipro for ppx   Acute on chronic combined systolic and diastolic congestive heart  failure. Acute hypoxemic respiratory failure. -Echo from 06/22/2019 showed ejection fraction of 25 to 30% with grade 3 diastolic dysfunction. She was dialyzed x2, but now getting diuresed. Pt reported does not want to get dialysis again. - repeat CXR with improved edema 11/7 -nephrology c/s, appreciate recs was started on- torsemide 40 mg daily -Saturating well on her baseline oxygen requirement.  Liver cirrhosis with ascites:Noted. GI consulted previously in hospitalization (see prior notes). -Continue cipro for SBP ppx  Dependent edema: -Patient has lower abdominal, lower back wall & leg edema-multifactorial in the setting of cirrhosis, CHF, renal failure & debility -Creatinine/GFR: Stable.  No respiratory symptoms.  Patient seems comfortable. -Continue with torsemide.  Hypotension: Remained stable.  Continue midodrine 10 mg TID.    Acute renal failure on chronic kidney disease stage IIIb: baseline creatinine appears to be 1.66. Creatinine peaked at 4.16, currently stable at 3.15 today -No plan for further dialysis per renal. Pt reportedly does not want dialysis again -renal transitioned to torsemide 40 mg-continue same. -Periodic checking of renal function  Hyponatremia: Resolved.   -Continue to monitor.  She is asymptomatic.  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 However, there is no GI bleed observed while in the hospital. It is not safe to continue hold anticoagulation. Anticoagulation has now been restarted with eliquis.  -Monitor hemoglobin. -Continue Eliquis  Overgrown toenails: will need outpatient podiatry follow up  Anemia of chronic disease: Hemoglobin improved to 8.6 today. -In the setting of CKD.  H&H is stable between 7-8.  MCV: 71.2.  -cont iron supplement -No indication of transfusion at this time. -EPO was started by nephrology.  Chronic thrombocytosis:  Platelet: 522-->441-->430. Improving.  Hypokalemia:Resolved.  GERD: Continue PPI   Depression/insomnia: Continue trazodone  Vaginal Itching: Denies  vaginal discharge.  Concern about fungal infection in the setting of chronic antibiotics.  Refused exam.   -Cont.  ketoconazole cream twice daily for 2 more days. -Her symptoms resolved as per patient  Chronic constipation: Cont. MiraLAX.  Morbid obesity. Body mass index is 45.68 kg/m.  Goals of care: appreciate palliative care involvement.  Complex situation overall.  She's now DNR with no plans for dialysis.  GOC are often difficult due to her orientation and limited insight at times, but she's been relatively consistent in her overall goals (no dialysis or heroic measures, consistent with her DNR).  Limited insight at times makes discharge planning more complex.  She was seen earlier by psych on 10/21 who noted that "she is mostly unable to make decisions for herself safely about her overall treatment". On 11/8, she attempted to leave against medical advice.  No safe DC plan was identified at that time.  Per Dr. Weber Cooks, she does not have capacity for safe decisions about living situation.  Overall a difficult situation, DSS working with [facility] team as well. Family does not want to take decisions for her.  DVT prophylaxis: Eliquis Code Status: DNR Family Communication: Discussed with patient.  Disposition Plan: await SNF placement  Consultants:  Palliative Nephrology GI  Procedures:  10/20 perma cath insertion perma cath removal 11/5  Antimicrobials:   As per chart  Status is: Inpatient  Dispo: The patient is from: home              Anticipated d/c is to: Pending              Anticipated d/c date is: >3 days              Patient currently medically stable for the discharge.  Subjective:  Patient is having some headache and asking for Tylenol which was given. No other complaint.  Objective: Vitals:   11/26/19 2031  11/26/19 2357 11/27/19 0813 11/27/19 1214  BP: 93/72 98/72 119/79 102/86  Pulse: 72 70 77 69  Resp: 18 17 17 17   Temp: (!) 97.5 F (36.4 C) 98 F (36.7 C) (!) 97.5 F (36.4 C) 97.7 F (36.5 C)  TempSrc: Oral  Oral Axillary  SpO2: 100% 99% 100% 100%  Weight:      Height:        Intake/Output Summary (Last 24 hours) at 11/27/2019 1507 Last data filed at 11/27/2019 1026 Gross per 24 hour  Intake 120 ml  Output --  Net 120 ml   Filed Weights   11/23/19 0500 11/24/19 0642 11/25/19 0500  Weight: 106.8 kg 104.3 kg 128.4 kg    Examination:  General.  Morbidly obese lady, in no acute distress. Pulmonary.  Lungs clear bilaterally, normal respiratory effort. CV.  Regular rate and rhythm, no JVD, rub or murmur. Abdomen.  Soft, nontender, nondistended, BS positive. CNS.  Alert and oriented x3.  No focal neurologic deficit. Extremities.  No edema, no cyanosis, pulses intact and symmetrical. Psychiatry.  Judgment and insight appears impaired.  Data Reviewed: I have personally reviewed following labs and imaging studies  CBC: Recent Labs  Lab 11/23/19 0422 11/27/19 0353  WBC 7.7 6.5  HGB 7.6* 8.6*  HCT 23.8* 27.9*  MCV 70.8* 71.9*  PLT 430* 465*   Basic Metabolic Panel: Recent Labs  Lab 11/23/19 0422 11/25/19 0924 11/26/19 0834  NA 133* 134* 135  K 3.7 3.9 4.0  CL 96* 94* 95*  CO2 24 26 23   GLUCOSE 88 86 90  BUN  65* 63* 62*  CREATININE 3.24*   3.25* 3.17* 3.15*  CALCIUM 8.9 9.2 9.5  PHOS  --  3.5 3.6   GFR: Estimated Creatinine Clearance: 25.1 mL/min (A) (by C-G formula based on SCr of 3.15 mg/dL (H)). Liver Function Tests: Recent Labs  Lab 11/23/19 0422 11/25/19 0924 11/26/19 0834  AST 19  --   --   ALT 9  --   --   ALKPHOS 248*  --   --   BILITOT 1.0  --   --   PROT 6.0*  --   --   ALBUMIN 2.2* 2.4* 2.4*   No results for input(s): LIPASE, AMYLASE in the last 168 hours. No results for input(s): AMMONIA in the last 168 hours. Coagulation Profile: No  results for input(s): INR, PROTIME in the last 168 hours. Cardiac Enzymes: No results for input(s): CKTOTAL, CKMB, CKMBINDEX, TROPONINI in the last 168 hours. BNP (last 3 results) No results for input(s): PROBNP in the last 8760 hours. HbA1C: No results for input(s): HGBA1C in the last 72 hours. CBG: No results for input(s): GLUCAP in the last 168 hours. Lipid Profile: No results for input(s): CHOL, HDL, LDLCALC, TRIG, CHOLHDL, LDLDIRECT in the last 72 hours. Thyroid Function Tests: No results for input(s): TSH, T4TOTAL, FREET4, T3FREE, THYROIDAB in the last 72 hours. Anemia Panel: No results for input(s): VITAMINB12, FOLATE, FERRITIN, TIBC, IRON, RETICCTPCT in the last 72 hours. Sepsis Labs: No results for input(s): PROCALCITON, LATICACIDVEN in the last 168 hours.  No results found for this or any previous visit (from the past 240 hour(s)).    Radiology Studies: No results found.  Scheduled Meds:  apixaban  5 mg Oral BID   ciprofloxacin  500 mg Oral Q breakfast   epoetin (EPOGEN/PROCRIT) injection  20,000 Units Subcutaneous Weekly   iron polysaccharides  150 mg Oral Daily   LORazepam  1 mg Intramuscular Once   mouth rinse  15 mL Mouth Rinse BID   melatonin  2.5 mg Oral QHS   midodrine  10 mg Oral TID WC   multivitamin  1 tablet Oral QHS   pantoprazole  40 mg Oral BID   polyethylene glycol  17 g Oral Daily   sodium chloride flush  3 mL Intravenous Q12H   torsemide  40 mg Oral Daily   traZODone  100 mg Oral QHS   Continuous Infusions:  sodium chloride Stopped (11/01/19 1131)     LOS: 39 days   Time spent: 25 minutes  Lorella Nimrod, MD [facility]  If 7PM-7AM, please contact night-coverage [email or web address] 11/27/2019, 3:07 PM

## 2019-11-28 DIAGNOSIS — K652 Spontaneous bacterial peritonitis: Secondary | ICD-10-CM | POA: Diagnosis not present

## 2019-11-28 NOTE — Plan of Care (Signed)
  Problem: Education: Goal: Knowledge of General Education information will improve Description: Including pain rating scale, medication(s)/side effects and non-pharmacologic comfort measures Outcome: Progressing   Problem: Education: Goal: Knowledge of General Education information will improve Description: Including pain rating scale, medication(s)/side effects and non-pharmacologic comfort measures Outcome: Progressing   Problem: Health Behavior/Discharge Planning: Goal: Ability to manage health-related needs will improve Outcome: Progressing   Problem: Clinical Measurements: Goal: Ability to maintain clinical measurements within normal limits will improve Outcome: Progressing Goal: Will remain free from infection Outcome: Progressing Goal: Diagnostic test results will improve Outcome: Progressing Goal: Respiratory complications will improve Outcome: Progressing Goal: Cardiovascular complication will be avoided Outcome: Progressing   Problem: Activity: Goal: Risk for activity intolerance will decrease Outcome: Progressing   Problem: Nutrition: Goal: Adequate nutrition will be maintained Outcome: Progressing   Problem: Coping: Goal: Level of anxiety will decrease Outcome: Progressing   Problem: Elimination: Goal: Will not experience complications related to bowel motility Outcome: Progressing Goal: Will not experience complications related to urinary retention Outcome: Progressing   Problem: Pain Managment: Goal: General experience of comfort will improve Outcome: Progressing   Problem: Safety: Goal: Ability to remain free from injury will improve Outcome: Progressing   Problem: Skin Integrity: Goal: Risk for impaired skin integrity will decrease Outcome: Progressing   Problem: Education: Goal: Ability to demonstrate management of disease process will improve Outcome: Progressing Goal: Ability to verbalize understanding of medication therapies will  improve Outcome: Progressing Goal: Individualized Educational Video(s) Outcome: Progressing   Problem: Activity: Goal: Capacity to carry out activities will improve Outcome: Progressing   Problem: Cardiac: Goal: Ability to achieve and maintain adequate cardiopulmonary perfusion will improve Outcome: Progressing   Problem: Education: Goal: Knowledge of disease and its progression will improve Outcome: Progressing Goal: Individualized Educational Video(s) Outcome: Progressing   Problem: Fluid Volume: Goal: Compliance with measures to maintain balanced fluid volume will improve Outcome: Progressing   Problem: Health Behavior/Discharge Planning: Goal: Ability to manage health-related needs will improve Outcome: Progressing   Problem: Nutritional: Goal: Ability to make healthy dietary choices will improve Outcome: Progressing   Problem: Clinical Measurements: Goal: Complications related to the disease process, condition or treatment will be avoided or minimized Outcome: Progressing

## 2019-11-28 NOTE — Progress Notes (Signed)
Patient noncompliant, unable to do assessment. Patient refused. Patient only wanted to take 3 of her 2200 medications.

## 2019-11-28 NOTE — Progress Notes (Signed)
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  After admission to the hospital, she was diagnosed with acute on chronic combinedsystolic and diastolic congestive heart failurewith ejection fraction 25 to 30%. She was not responding to IV Lasix. She was seen by nephrology, temporary dialysis catheter was performed and she was dialyzed on 10/17 and 10/18.Patient also complaining of abdominal pain, she had a paracentesis, study showed possible spontaneous peritonitis, she was treated with Rocephin and Flagyl.  Patient had a worsening respiratory status on 10/17, was diagnosed with aspiration pneumonia. She was treated with Rocephin, Zithromax and Flagyl. Patient also had abdominal pain and a CT scan suspect ischemia, but seen by general surgery on 10/19, no need for surgery.  Patient was evaluated by psychiatry and she does not has decision-making capacity. Family does not want to participate in making decisions for her. DSS case was filed for guardianship and still pending. Difficult disposition due to unsafe discharge.  Assessment & Plan:  Spontaneous bacterial peritonitis: Resolved.  Repeat paracentesis showed significant decrease of white cell in ascites, antibiotics effective. However she still has a lot of neutrophils in 11/03/2019 fluid. Culture has been negative.Pt received many days ofRocephin, sincediscontinued.  -Continue cipro for ppx   Acute on chronic combined systolic and diastolic congestive heart  failure. Acute hypoxemic respiratory failure. -Echo from 06/22/2019 showed ejection fraction of 25 to 30% with grade 3 diastolic dysfunction. She was dialyzed x2, but now getting diuresed. Pt reported does not want to get dialysis again. - repeat CXR with improved edema 11/7 -nephrology c/s, appreciate recs was started on- torsemide 40 mg daily -Saturating well on her baseline oxygen requirement.  Liver cirrhosis with ascites:Noted. GI consulted previously in hospitalization (see prior notes). -Continue cipro for SBP ppx  Dependent edema: -Patient has lower abdominal, lower back wall & leg edema-multifactorial in the setting of cirrhosis, CHF, renal failure & debility -Creatinine/GFR: Stable.  No respiratory symptoms.  Patient seems comfortable. -Continue with torsemide.  Hypotension: Remained stable.  Continue midodrine 10 mg TID.    Acute renal failure on chronic kidney disease stage IIIb: baseline creatinine appears to be 1.66. Creatinine peaked at 4.16, currently stable at 3.15 today -No plan for further dialysis per renal. Pt reportedly does not want dialysis again -renal transitioned to torsemide 40 mg-continue same. -Periodic checking of renal function  Hyponatremia: Resolved.   -Continue to monitor.  She is asymptomatic.  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 However, there is no GI bleed observed while in the hospital. It is not safe to continue hold anticoagulation. Anticoagulation has now been restarted with eliquis.  -Monitor hemoglobin. -Continue Eliquis  Overgrown toenails: will need outpatient podiatry follow up  Anemia of chronic disease: Hemoglobin improved to 8.6 today. -In the setting of CKD.  H&H is stable between 7-8.  MCV: 71.2.  -cont iron supplement -No indication of transfusion at this time. -EPO was started by nephrology.  Chronic thrombocytosis:  Platelet: 522-->441-->430. Improving.  Hypokalemia:Resolved.  GERD: Continue PPI   Depression/insomnia: Continue trazodone  Vaginal Itching: Resolved.  Chronic constipation: Cont. MiraLAX.  Morbid obesity. Body mass index is 37.93 kg/m.  Goals of care: appreciate palliative care involvement.  Complex situation overall.  She's now DNR with no plans for dialysis.  GOC are often difficult due to her orientation and limited insight at times, but she's been relatively consistent in her overall goals (no dialysis or heroic measures, consistent with her DNR).  Limited insight at times makes discharge planning more complex.  She was seen earlier by psych on 10/21 who noted that "she is mostly unable to make decisions for herself safely about her overall treatment". On 11/8, she attempted to leave against medical advice.  No safe DC plan was identified at that time.  Per Dr. Weber Cooks, she does not have capacity for safe decisions about living situation.  Overall a difficult situation, DSS working with [facility] team as well. Family does not want to take decisions for her.  DVT prophylaxis: Eliquis Code Status: DNR Family Communication: Discussed with patient.  Disposition Plan: await SNF placement  Consultants:  Palliative Nephrology GI  Procedures:  10/20 perma cath insertion perma cath removal 11/5  Antimicrobials:   As per chart  Status is: Inpatient  Dispo: The patient is from: home              Anticipated d/c is to: Pending              Anticipated d/c date is: >3 days              Patient currently medically stable for the discharge.  Subjective:  Patient was resting comfortably when seen today. No new complaint.  Objective: Vitals:   11/27/19 2019 11/28/19 0305 11/28/19 0500 11/28/19 0815  BP: 108/87 (!) 115/93  110/90  Pulse: 69 76  88  Resp: 17 18  18   Temp: 97.6 F (36.4 C) 97.7 F (36.5 C)  98 F (36.7 C)  TempSrc:    Oral  SpO2: 99% 99%  100%  Weight:    106.6 kg   Height:       No intake or output data in the 24 hours ending 11/28/19 1530 Filed Weights   11/24/19 0642 11/25/19 0500 11/28/19 0500  Weight: 104.3 kg 128.4 kg 106.6 kg    Examination:  General.  Morbidly obese lady, in no acute distress. Pulmonary.  Lungs clear bilaterally, normal respiratory effort. CV.  Regular rate and rhythm, no JVD, rub or murmur. Abdomen.  Soft, nontender, nondistended, BS positive. CNS.  Alert and oriented x3.  No focal neurologic deficit. Extremities.  No edema, no cyanosis, pulses intact and symmetrical. Psychiatry.  Judgment and insight appears impaired.  Data Reviewed: I have personally reviewed following labs and imaging studies  CBC: Recent Labs  Lab 11/23/19 0422 11/27/19 0353  WBC 7.7 6.5  HGB 7.6* 8.6*  HCT 23.8* 27.9*  MCV 70.8* 71.9*  PLT 430* 016*   Basic Metabolic Panel: Recent Labs  Lab 11/23/19 0422 11/25/19 0924 11/26/19 0834  NA 133* 134* 135  K 3.7 3.9 4.0  CL 96* 94* 95*  CO2 24 26 23   GLUCOSE 88 86 90  BUN 65* 63* 62*  CREATININE 3.24*  3.25* 3.17* 3.15*  CALCIUM 8.9 9.2 9.5  PHOS  --  3.5 3.6   GFR: Estimated Creatinine Clearance: 22.6 mL/min (A) (by C-G formula based on SCr of 3.15 mg/dL (H)). Liver Function Tests: Recent Labs  Lab 11/23/19 0422 11/25/19 0924 11/26/19 0834  AST 19  --   --  ALT 9  --   --   ALKPHOS 248*  --   --   BILITOT 1.0  --   --   PROT 6.0*  --   --   ALBUMIN 2.2* 2.4* 2.4*   No results for input(s): LIPASE, AMYLASE in the last 168 hours. No results for input(s): AMMONIA in the last 168 hours. Coagulation Profile: No results for input(s): INR, PROTIME in the last 168 hours. Cardiac Enzymes: No results for input(s): CKTOTAL, CKMB, CKMBINDEX, TROPONINI in the last 168 hours. BNP (last 3 results) No results for input(s): PROBNP in the last 8760 hours. HbA1C: No results for input(s): HGBA1C in the last 72 hours. CBG: No results for input(s): GLUCAP in the last 168  hours. Lipid Profile: No results for input(s): CHOL, HDL, LDLCALC, TRIG, CHOLHDL, LDLDIRECT in the last 72 hours. Thyroid Function Tests: No results for input(s): TSH, T4TOTAL, FREET4, T3FREE, THYROIDAB in the last 72 hours. Anemia Panel: No results for input(s): VITAMINB12, FOLATE, FERRITIN, TIBC, IRON, RETICCTPCT in the last 72 hours. Sepsis Labs: No results for input(s): PROCALCITON, LATICACIDVEN in the last 168 hours.  No results found for this or any previous visit (from the past 240 hour(s)).    Radiology Studies: No results found.  Scheduled Meds: . apixaban  5 mg Oral BID  . ciprofloxacin  500 mg Oral Q breakfast  . epoetin (EPOGEN/PROCRIT) injection  20,000 Units Subcutaneous Weekly  . iron polysaccharides  150 mg Oral Daily  . LORazepam  1 mg Intramuscular Once  . mouth rinse  15 mL Mouth Rinse BID  . melatonin  2.5 mg Oral QHS  . midodrine  10 mg Oral TID WC  . multivitamin  1 tablet Oral QHS  . pantoprazole  40 mg Oral BID  . polyethylene glycol  17 g Oral Daily  . sodium chloride flush  3 mL Intravenous Q12H  . torsemide  40 mg Oral Daily  . traZODone  100 mg Oral QHS   Continuous Infusions: . sodium chloride Stopped (11/01/19 1131)     LOS: 40 days   Time spent: 25 minutes  Lorella Nimrod, MD [facility]  If 7PM-7AM, please contact night-coverage [email or web address] 11/28/2019, 3:30 PM

## 2019-11-28 NOTE — Progress Notes (Signed)
Katelyn Lane  MRN: [id number]  DOB/AGE: April 27, 1955 64 y.o.  Primary Care Physician:Revelo, Elyse Jarvis, MD  Admit date: 10/19/2019  Chief Complaint:  Chief Complaint  Patient presents with  . Leg Swelling    S-Pt presented on  10/19/2019 with  Chief Complaint  Patient presents with  . Leg Swelling  . Patient offers no specific complaints    Medications . apixaban  5 mg Oral BID  . ciprofloxacin  500 mg Oral Q breakfast  . epoetin (EPOGEN/PROCRIT) injection  20,000 Units Subcutaneous Weekly  . iron polysaccharides  150 mg Oral Daily  . LORazepam  1 mg Intramuscular Once  . mouth rinse  15 mL Mouth Rinse BID  . melatonin  2.5 mg Oral QHS  . midodrine  10 mg Oral TID WC  . multivitamin  1 tablet Oral QHS  . pantoprazole  40 mg Oral BID  . polyethylene glycol  17 g Oral Daily  . sodium chloride flush  3 mL Intravenous Q12H  . torsemide  40 mg Oral Daily  . traZODone  100 mg Oral QHS         LDJ:TTSVX from the symptoms mentioned above,there are no other symptoms referable to all systems reviewed.  Physical Exam: Vital signs in last 24 hours: Temp:  [97.5 F (36.4 C)-97.7 F (36.5 C)] 97.7 F (36.5 C) (11/20 0305) Pulse Rate:  [69-94] 76 (11/20 0305) Resp:  [17-18] 18 (11/20 0305) BP: (102-115)/(78-93) 115/93 (11/20 0305) SpO2:  [98 %-100 %] 99 % (11/20 0305) Weight:  [106.6 kg] 106.6 kg (11/20 0500) Weight change:  Last BM Date: 11/26/19  Intake/Output from previous day: 11/19 0701 - 11/20 0700 In: 120 [P.O.:120] Out: -  No intake/output data recorded.   Physical Exam:   General- pt is awake,alert, oriented to time place and person  Resp- No acute REsp distress, CTA B/L NO Rhonchi  CVS- S1S2 regular in rate and rhythm  GIT- BS+, soft, Non tender , Non distended  EXT- trace LE Edema, NO Cyanosis    Lab Results:  CBC  Recent Labs    11/27/19 0353  WBC 6.5  HGB 8.6*  HCT 27.9*  PLT 476*    BMET  Recent Labs     11/25/19 0924 11/26/19 0834  NA 134* 135  K 3.9 4.0  CL 94* 95*  CO2 26 23  GLUCOSE 86 90  BUN 63* 62*  CREATININE 3.17* 3.15*  CALCIUM 9.2 9.5      Most recent Creatinine trend  Lab Results  Component Value Date   CREATININE 3.15 (H) 11/26/2019   CREATININE 3.17 (H) 11/25/2019   CREATININE 3.25 (H) 11/23/2019   CREATININE 3.24 (H) 11/23/2019    Creat trend 2021 1.8--4.0( Multiple admissions)  2020 1.7--2.3 ( Multiple admissions)  2014 1.54  MICRO   No results found for this or any previous visit (from the past 240 hour(s)).       Impression:   [name] is a 64 y.o. black female with congestive heart failure, hypertension, hyperlipidemia, gout, COPD, anemia, cocaine abuse who is admitted to [facility] on 10/19/2019 for Peripheral edema [R60.9] Acute CHF (congestive heart failure) . AKI (acute kidney injury) ( Congestive heart failure, unspecified HF chronicity, unspecified heart failure type .  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0 - 15.0 g/dL 8.6(L) 7.6(L) 7.4(L)  Hematocrit 36 - 46 % 27.9(L) 23.8(L) 23.2(L)  Platelets 150 - 400 K/uL 476(H) 430(H) 441(H)       HGb is not at goal (9--11)   Improving    Patient is on Epogen  4) Secondary hyperparathyroidism -CKD Mineral-Bone Disorder    Lab Results   Component Value Date   CALCIUM 9.5 11/26/2019   PHOS 3.6 11/26/2019    Secondary Hyperparathyroidism absent.  Phosphorus at goal.   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   Echocardiogram 06/22/19 EF of 25-30%.  -on 3 L nasal cannula, patient keeps it  as needed -Blood pressure readings stays at the low normal range -Continue Torsemide 40 mg po daily   Pt earlier had exacerbation, now better  6) Electrolytes   BMP Latest Ref Rng & Units 11/26/2019 11/25/2019 11/23/2019  Glucose 70 - 99 mg/dL 90 86 88  BUN 8 - 23 mg/dL 62(H) 63(H) 65(H)  Creatinine 0.44 - 1.00 mg/dL 3.15(H) 3.17(H) 3.24(H)  Sodium 135 - 145 mmol/L 135 134(L) 133(L)  Potassium 3.5 - 5.1 mmol/L 4.0 3.9 3.7  Chloride 98 - 111 mmol/L 95(L) 94(L) 96(L)  CO2 22 - 32 mmol/L 23 26 24   Calcium 8.9 - 10.3 mg/dL 9.5 9.2 8.9     Sodium Normonatremic   Potassium Normokalemic    7)Acid base Co2 at goal  8) Ascites/ Bacterial Peritonitis Patient is now clinically better Primary and GI  team managing    Plan:   We will continue current care      Sparsh Callens s [facility] 11/28/2019, 8:34 AM

## 2019-11-28 NOTE — Plan of Care (Signed)

## 2019-11-29 DIAGNOSIS — K652 Spontaneous bacterial peritonitis: Secondary | ICD-10-CM | POA: Diagnosis not present

## 2019-11-29 LAB — CREATININE, SERUM
Creatinine, Ser: 3.08 mg/dL — ABNORMAL HIGH (ref 0.44–1.00)
GFR, Estimated: 16 mL/min — ABNORMAL LOW (ref >60)

## 2019-11-29 NOTE — Progress Notes (Signed)
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  After admission to the hospital, she was diagnosed with acute on chronic combinedsystolic and diastolic congestive heart failurewith ejection fraction 25 to 30%. She was not responding to IV Lasix. She was seen by nephrology, temporary dialysis catheter was performed and she was dialyzed on 10/17 and 10/18.Patient also complaining of abdominal pain, she had a paracentesis, study showed possible spontaneous peritonitis, she was treated with Rocephin and Flagyl.  Patient had a worsening respiratory status on 10/17, was diagnosed with aspiration pneumonia. She was treated with Rocephin, Zithromax and Flagyl. Patient also had abdominal pain and a CT scan suspect ischemia, but seen by general surgery on 10/19, no need for surgery.  Patient was evaluated by psychiatry and she does not has decision-making capacity. Family does not want to participate in making decisions for her. DSS case was filed for guardianship and still pending. Difficult disposition due to unsafe discharge.  Assessment & Plan:  Spontaneous bacterial peritonitis: Resolved.  Repeat paracentesis showed significant decrease of white cell in ascites, antibiotics effective. However she still has a lot of neutrophils in 11/03/2019 fluid. Culture has been negative.Pt received many days ofRocephin, sincediscontinued.  -Continue cipro for ppx   Acute on chronic combined systolic and diastolic congestive heart  failure. Acute hypoxemic respiratory failure. -Echo from 06/22/2019 showed ejection fraction of 25 to 30% with grade 3 diastolic dysfunction. She was dialyzed x2, but now getting diuresed. Pt reported does not want to get dialysis again. - repeat CXR with improved edema 11/7 -nephrology c/s, appreciate recs was started on- torsemide 40 mg daily -Saturating well on her baseline oxygen requirement.  Liver cirrhosis with ascites:Noted. GI consulted previously in hospitalization (see prior notes). -Continue cipro for SBP ppx  Dependent edema: Improving. -Patient has lower abdominal, lower back wall & leg edema-multifactorial in the setting of cirrhosis, CHF, renal failure & debility -Creatinine/GFR: Stable.  No respiratory symptoms.  Patient seems comfortable. -Continue with torsemide.  Hypotension: Remained stable.  Continue midodrine 10 mg TID.    Acute renal failure on chronic kidney disease stage IIIb: baseline creatinine appears to be 1.66. Creatinine peaked at 4.16, currently stable with mild improvement. -No plan for further dialysis per renal. Pt reportedly does not want dialysis again -renal transitioned to torsemide 40 mg-continue same. -Periodic checking of renal function  Hyponatremia: Resolved.   -Continue to monitor.  She is asymptomatic.  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 However, there is no GI bleed observed while in the hospital. It is not safe to continue hold anticoagulation. Anticoagulation has now been restarted with eliquis.  -Monitor hemoglobin. -Continue Eliquis  Overgrown toenails: will need outpatient podiatry follow up  Anemia of chronic disease: Hemoglobin improved to 8.6 today. -In the setting of CKD.  H&H is stable between 7-8.  MCV: 71.2.  -cont iron supplement -No indication of transfusion at this time. -EPO was started by  nephrology.  Chronic thrombocytosis: Platelet: 522-->441-->430. Improving.  Hypokalemia:Resolved.  GERD: Continue PPI   Depression/insomnia: Continue trazodone  Vaginal Itching:  Resolved.  Chronic constipation: Cont. MiraLAX.  Morbid obesity. Body mass index is 37.93 kg/m.  Goals of care: appreciate palliative care involvement.  Complex situation overall.  She's now DNR with no plans for dialysis.  GOC are often difficult due to her orientation and limited insight at times, but she's been relatively consistent in her overall goals (no dialysis or heroic measures, consistent with her DNR).  Limited insight at times makes discharge planning more complex.  She was seen earlier by psych on 10/21 who noted that "she is mostly unable to make decisions for herself safely about her overall treatment". On 11/8, she attempted to leave against medical advice.  No safe DC plan was identified at that time.  Per Dr. Weber Cooks, she does not have capacity for safe decisions about living situation.  Overall a difficult situation, DSS working with [facility] team as well. Family does not want to take decisions for her.  DVT prophylaxis: Eliquis Code Status: DNR Family Communication: Discussed with patient.  Disposition Plan: await SNF placement, we are waiting for guardianship appointed by the court in order to send her to SNF which is taking forever.  Consultants:  Palliative Nephrology GI  Procedures:  10/20 perma cath insertion perma cath removal 11/5  Antimicrobials:   As per chart  Status is: Inpatient  Dispo: The patient is from: home              Anticipated d/c is to: Pending              Anticipated d/c date is: >3 days              Patient currently medically stable for the discharge.  Subjective:  Patient has no new complaint but she wants to go home.  She was refusing to go to any facility, stating that she will only go back to her own home.  Objective: Vitals:   11/29/19 0048  11/29/19 0457 11/29/19 0500 11/29/19 0815  BP: (!) 114/96 (!) 114/92  (!) 117/97  Pulse: 93 90  88  Resp: 17 17  18   Temp: (!) 97.4 F (36.3 C) 98.2 F (36.8 C)  97.6 F (36.4 C)  TempSrc: Oral     SpO2: 100% 100%  98%  Weight:   106.6 kg   Height:       No intake or output data in the 24 hours ending 11/29/19 1251 Filed Weights   11/25/19 0500 11/28/19 0500 11/29/19 0500  Weight: 128.4 kg 106.6 kg 106.6 kg    Examination:  General.  Morbidly obese lady, in no acute distress. Pulmonary.  Lungs clear bilaterally, normal respiratory effort. CV.  Regular rate and rhythm, no JVD, rub or murmur. Abdomen.  Soft, nontender, nondistended, BS positive. CNS.  Alert and oriented x3.  No focal neurologic deficit. Extremities.  No edema, no cyanosis, pulses intact and symmetrical. Psychiatry.  Judgment and insight appears impaired.  Data Reviewed: I have personally reviewed following labs and imaging studies  CBC: Recent Labs  Lab 11/23/19 0422 11/27/19 0353  WBC 7.7 6.5  HGB 7.6* 8.6*  HCT 23.8* 27.9*  MCV 70.8* 71.9*  PLT 430* 096*   Basic Metabolic Panel: Recent Labs  Lab 11/23/19 0422 11/25/19 0924 11/26/19 0834 11/29/19 0448  NA 133* 134* 135  --   K 3.7 3.9 4.0  --   CL 96* 94* 95*  --   CO2 24 26 23   --   GLUCOSE 88 86 90  --   BUN  65* 63* 62*  --   CREATININE 3.24*  3.25* 3.17* 3.15* 3.08*  CALCIUM 8.9 9.2 9.5  --   PHOS  --  3.5 3.6  --    GFR: Estimated Creatinine Clearance: 23.1 mL/min (A) (by C-G formula based on SCr of 3.08 mg/dL (H)). Liver Function Tests: Recent Labs  Lab 11/23/19 0422 11/25/19 0924 11/26/19 0834  AST 19  --   --   ALT 9  --   --   ALKPHOS 248*  --   --   BILITOT 1.0  --   --   PROT 6.0*  --   --   ALBUMIN 2.2* 2.4* 2.4*   No results for input(s): LIPASE, AMYLASE in the last 168 hours. No results for input(s): AMMONIA in the last 168 hours. Coagulation Profile: No results for input(s): INR, PROTIME in the last 168  hours. Cardiac Enzymes: No results for input(s): CKTOTAL, CKMB, CKMBINDEX, TROPONINI in the last 168 hours. BNP (last 3 results) No results for input(s): PROBNP in the last 8760 hours. HbA1C: No results for input(s): HGBA1C in the last 72 hours. CBG: No results for input(s): GLUCAP in the last 168 hours. Lipid Profile: No results for input(s): CHOL, HDL, LDLCALC, TRIG, CHOLHDL, LDLDIRECT in the last 72 hours. Thyroid Function Tests: No results for input(s): TSH, T4TOTAL, FREET4, T3FREE, THYROIDAB in the last 72 hours. Anemia Panel: No results for input(s): VITAMINB12, FOLATE, FERRITIN, TIBC, IRON, RETICCTPCT in the last 72 hours. Sepsis Labs: No results for input(s): PROCALCITON, LATICACIDVEN in the last 168 hours.  No results found for this or any previous visit (from the past 240 hour(s)).    Radiology Studies: No results found.  Scheduled Meds: . apixaban  5 mg Oral BID  . ciprofloxacin  500 mg Oral Q breakfast  . epoetin (EPOGEN/PROCRIT) injection  20,000 Units Subcutaneous Weekly  . iron polysaccharides  150 mg Oral Daily  . LORazepam  1 mg Intramuscular Once  . mouth rinse  15 mL Mouth Rinse BID  . melatonin  2.5 mg Oral QHS  . midodrine  10 mg Oral TID WC  . multivitamin  1 tablet Oral QHS  . pantoprazole  40 mg Oral BID  . polyethylene glycol  17 g Oral Daily  . sodium chloride flush  3 mL Intravenous Q12H  . torsemide  40 mg Oral Daily  . traZODone  100 mg Oral QHS   Continuous Infusions: . sodium chloride Stopped (11/01/19 1131)     LOS: 41 days   Time spent: 20 minutes  Lorella Nimrod, MD [facility]  If 7PM-7AM, please contact night-coverage [email or web address] 11/29/2019, 12:51 PM

## 2019-11-29 NOTE — Progress Notes (Signed)
Katelyn Lane  MRN: [id number]  DOB/AGE: 64-Aug-1957 64 y.o.  Primary Care Physician:Revelo, Katelyn Jarvis, MD  Admit date: 10/19/2019  Chief Complaint:  Chief Complaint  Patient presents with  . Leg Swelling    S-Pt presented on  10/19/2019 with  Chief Complaint  Patient presents with  . Leg Swelling  . Patient offers no specific complaints    Medications . apixaban  5 mg Oral BID  . ciprofloxacin  500 mg Oral Q breakfast  . epoetin (EPOGEN/PROCRIT) injection  20,000 Units Subcutaneous Weekly  . iron polysaccharides  150 mg Oral Daily  . LORazepam  1 mg Intramuscular Once  . mouth rinse  15 mL Mouth Rinse BID  . melatonin  2.5 mg Oral QHS  . midodrine  10 mg Oral TID WC  . multivitamin  1 tablet Oral QHS  . pantoprazole  40 mg Oral BID  . polyethylene glycol  17 g Oral Daily  . sodium chloride flush  3 mL Intravenous Q12H  . torsemide  40 mg Oral Daily  . traZODone  100 mg Oral QHS         SWN:IOEVO from the symptoms mentioned above,there are no other symptoms referable to all systems reviewed.  Physical Exam: Vital signs in last 24 hours: Temp:  [97.4 F (36.3 C)-98.2 F (36.8 C)] 98.2 F (36.8 C) (11/21 0457) Pulse Rate:  [88-93] 90 (11/21 0457) Resp:  [17-19] 17 (11/21 0457) BP: (110-121)/(79-96) 114/92 (11/21 0457) SpO2:  [95 %-100 %] 100 % (11/21 0457) Weight:  [106.6 kg] 106.6 kg (11/21 0500) Weight change: -0 kg Last BM Date: 11/28/19  Intake/Output from previous day: No intake/output data recorded. No intake/output data recorded.   Physical Exam:   General- pt is awake,alert, oriented to time place and person  Resp- No acute REsp distress, CTA B/L NO Rhonchi  CVS- S1S2 regular in rate and rhythm  GIT- BS+, soft, Non tender , Non distended  EXT- trace LE Edema, NO Cyanosis    Lab Results:  CBC  Recent Labs    11/27/19 0353  WBC 6.5  HGB 8.6*  HCT 27.9*  PLT 476*    BMET  Recent Labs    11/26/19 0834  11/29/19 0448  NA 135  --   K 4.0  --   CL 95*  --   CO2 23  --   GLUCOSE 90  --   BUN 62*  --   CREATININE 3.15* 3.08*  CALCIUM 9.5  --       Most recent Creatinine trend  Lab Results  Component Value Date   CREATININE 3.08 (H) 11/29/2019   CREATININE 3.15 (H) 11/26/2019   CREATININE 3.17 (H) 11/25/2019    Creat trend 2021 1.8--4.0( Multiple admissions)  2020 1.7--2.3 ( Multiple admissions)  2014 1.54  MICRO   No results found for this or any previous visit (from the past 240 hour(s)).       Impression:   [name] is a 64 y.o. black female with congestive heart failure, hypertension, hyperlipidemia, gout, COPD, anemia, cocaine abuse who is admitted to [facility] on 10/19/2019 for Peripheral edema [R60.9] Acute CHF (congestive heart failure) . AKI (acute kidney injury) ( Congestive heart failure, unspecified HF chronicity, unspecified heart failure type .  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 CKD secondary to HTN Progression of CKD marked with multiple AKI   Patient did require dialysis during this admission Patient  had permacath placed as well Patient current urine output is adequate and patient creatinine is trending down without dialysis Patient GFR thereafter remained stable Patient tunnel catheter was removed Patient is now planning for no dialysis   Stable GFR Will continue current care      2)hypotension  Blood pressure is stable    3)Anemia of chronic disease  CBC Latest Ref Rng & Units 11/27/2019 11/23/2019 11/20/2019  WBC 4.0 - 10.5 K/uL 6.5 7.7 9.9  Hemoglobin 12.0 - 15.0 g/dL 8.6(L) 7.6(L) 7.4(L)  Hematocrit 36 - 46 % 27.9(L) 23.8(L) 23.2(L)  Platelets 150 - 400 K/uL 476(H) 430(H) 441(H)       HGb is not at goal (9--11)   Improving    Patient is on Epogen  4) Secondary hyperparathyroidism -CKD Mineral-Bone Disorder    Lab Results  Component Value Date   CALCIUM 9.5  11/26/2019   PHOS 3.6 11/26/2019    Secondary Hyperparathyroidism absent.  Phosphorus at goal.   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   Echocardiogram 06/22/19 EF of 25-30%.  -on 3 L nasal cannula, patient keeps it  as needed -Blood pressure readings stays at the low normal range -Continue Torsemide 40 mg po daily   Pt earlier had exacerbation, now better  6) Electrolytes   BMP Latest Ref Rng & Units 11/29/2019 11/26/2019 11/25/2019  Glucose 70 - 99 mg/dL - 90 86  BUN 8 - 23 mg/dL - 62(H) 63(H)  Creatinine 0.44 - 1.00 mg/dL 3.08(H) 3.15(H) 3.17(H)  Sodium 135 - 145 mmol/L - 135 134(L)  Potassium 3.5 - 5.1 mmol/L - 4.0 3.9  Chloride 98 - 111 mmol/L - 95(L) 94(L)  CO2 22 - 32 mmol/L - 23 26  Calcium 8.9 - 10.3 mg/dL - 9.5 9.2     Sodium Normonatremic   Potassium Normokalemic    7)Acid base Co2 at goal  8) Ascites/ Bacterial Peritonitis Patient is now clinically better Primary and GI  team managing    Plan:   We will continue current care      Katelyn Lane s [facility] 11/29/2019, 7:11 AM

## 2019-11-30 DIAGNOSIS — K652 Spontaneous bacterial peritonitis: Secondary | ICD-10-CM | POA: Diagnosis not present

## 2019-11-30 NOTE — TOC Progression Note (Signed)
Transition of Care [facility]) - Progression Note    Patient Details  Name: Katelyn Lane MRN: [id number] Date of Birth: 11/30/1955  [facility] CM/SW Trent Woods, RN Phone Number: 11/30/2019, 9:03 AM  Clinical Narrative:   RNCM reached out to Raymond Gurney with [employer] for update. [name] reports that [employer] one of the guardianship SW plans to come out today for an assessment with patient. They will then use the results of that assessment to determine if guardianship should be considered. Discussed with her that the physicians here are still of the conclusion that patient does not have capacity. [name] reports that she would be happy to speak with any of the physicians but that if DSS deems her to have capacity they will not pursue guardianship. [name]'s contact number is [phone].          Expected Discharge Plan and Services                                                 Social Determinants of Health (SDOH) Interventions    Readmission Risk Interventions Readmission Risk Prevention Plan 09/02/2019 08/14/2019 07/27/2019  Transportation Screening Complete Complete Complete  Medication Review [employer] photographer) Complete Referral to Pharmacy -  PCP or Specialist appointment within 3-5 days of discharge Complete Complete Complete  HRI or Home Care Consult - Complete Complete  SW Recovery Care/Counseling Consult - Complete Complete  Palliative Care Screening Not Applicable Not Applicable Not Applicable  Skilled Nursing Facility Complete Not Applicable Not Applicable  Some recent data might be hidden

## 2019-11-30 NOTE — Progress Notes (Signed)
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  After admission to the hospital, she was diagnosed with acute on chronic combinedsystolic and diastolic congestive heart failurewith ejection fraction 25 to 30%. She was not responding to IV Lasix. She was seen by nephrology, temporary dialysis catheter was performed and she was dialyzed on 10/17 and 10/18.Patient also complaining of abdominal pain, she had a paracentesis, study showed possible spontaneous peritonitis, she was treated with Rocephin and Flagyl.  Patient had a worsening respiratory status on 10/17, was diagnosed with aspiration pneumonia. She was treated with Rocephin, Zithromax and Flagyl. Patient also had abdominal pain and a CT scan suspect ischemia, but seen by general surgery on 10/19, no need for surgery.  Patient was evaluated by psychiatry and she does not has decision-making capacity. Family does not want to participate in making decisions for her. DSS case was filed for guardianship and still pending. Difficult disposition due to unsafe discharge.  Assessment & Plan:  Spontaneous bacterial peritonitis: Resolved.  Repeat paracentesis showed significant decrease of white cell in ascites, antibiotics effective. However she still has a lot of neutrophils in 11/03/2019 fluid. Culture has been negative.Pt received many days ofRocephin, sincediscontinued.  -Continue cipro for ppx   Acute on chronic combined systolic and diastolic congestive heart  failure. Acute hypoxemic respiratory failure. -Echo from 06/22/2019 showed ejection fraction of 25 to 30% with grade 3 diastolic dysfunction. She was dialyzed x2, but now getting diuresed. Pt reported does not want to get dialysis again. - repeat CXR with improved edema 11/7 -nephrology c/s, appreciate recs was started on- torsemide 40 mg daily -Saturating well on her baseline oxygen requirement.  Liver cirrhosis with ascites:Noted. GI consulted previously in hospitalization (see prior notes). -Continue cipro for SBP ppx  Dependent edema: Improving. -Patient has lower abdominal, lower back wall & leg edema-multifactorial in the setting of cirrhosis, CHF, renal failure & debility -Creatinine/GFR: Stable.  No respiratory symptoms.  Patient seems comfortable. -Continue with torsemide.  Hypotension: Remained stable.  Continue midodrine 10 mg TID.    Acute renal failure on chronic kidney disease stage IIIb: baseline creatinine appears to be 1.66. Creatinine peaked at 4.16, currently stable with mild improvement. -No plan for further dialysis per renal. Pt reportedly does not want dialysis again -renal transitioned to torsemide 40 mg-continue same. -Periodic checking of renal function  Hyponatremia: Resolved.   -Continue to monitor.  She is asymptomatic.  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 However, there is no GI bleed observed while in the hospital. It is not safe to continue hold anticoagulation. Anticoagulation has now been restarted with eliquis.  -Monitor hemoglobin. -Continue Eliquis  Overgrown toenails: will need outpatient podiatry follow up  Anemia of chronic disease: Hemoglobin improved to 8.6 today. -In the setting of CKD.  H&H is stable between 7-8.  MCV: 71.2.  -cont iron supplement -No indication of transfusion at this time. -EPO was started by  nephrology.  Chronic thrombocytosis: Platelet: 522-->441-->430. Improving.  Hypokalemia:Resolved.  GERD: Continue PPI   Depression/insomnia: Continue trazodone  Vaginal Itching:  Resolved.  Chronic constipation: Cont. MiraLAX.  Morbid obesity. Body mass index is 37.93 kg/m.  Goals of care: appreciate palliative care involvement.  Complex situation overall.  She's now DNR with no plans for dialysis.  GOC are often difficult due to her orientation and limited insight at times, but she's been relatively consistent in her overall goals (no dialysis or heroic measures, consistent with her DNR).  Limited insight at times makes discharge planning more complex.  She was seen earlier by psych on 10/21 who noted that "she is mostly unable to make decisions for herself safely about her overall treatment". On 11/8, she attempted to leave against medical advice.  No safe DC plan was identified at that time.  Per Dr. Weber Cooks, she does not have capacity for safe decisions about living situation.  Overall a difficult situation, DSS working with [facility] team as well. Family does not want to take decisions for her.  DVT prophylaxis: Eliquis Code Status: DNR Family Communication: Discussed with patient.  Disposition Plan: await SNF placement, we are waiting for guardianship appointed by the court in order to send her to SNF which is taking forever. Apparently going to have a interview by social services for guardianship today.  Consultants:  Palliative Nephrology GI  Procedures:  10/20 perma cath insertion perma cath removal 11/5  Antimicrobials:   As per chart  Status is: Inpatient  Dispo: The patient is from: home              Anticipated d/c is to: Pending              Anticipated d/c date is: >3 days              Patient currently medically stable for the discharge.  Subjective:  Patient has no new complaint but she wants to go home.  She was refusing to go to any facility, stating  that she will only go back to her own home.  Objective: Vitals:   11/29/19 2346 11/30/19 0509 11/30/19 0745 11/30/19 1125  BP: 104/84 (!) 114/101 111/88 100/69  Pulse: 91 89 92 88  Resp: 17 18 18 18   Temp: 98 F (36.7 C) (!) 97.5 F (36.4 C) 98.4 F (36.9 C) 98.2 F (36.8 C)  TempSrc:      SpO2: 100% 100% 99% 94%  Weight:      Height:        Intake/Output Summary (Last 24 hours) at 11/30/2019 1553 Last data filed at 11/30/2019 1403 Gross per 24 hour  Intake 480 ml  Output --  Net 480 ml   Filed Weights   11/25/19 0500 11/28/19 0500 11/29/19 0500  Weight: 128.4 kg 106.6 kg 106.6 kg    Examination:  General.  Morbidly obese lady, in no acute distress. Pulmonary.  Lungs clear bilaterally, normal respiratory effort. CV.  Regular rate and rhythm, no JVD, rub or murmur. Abdomen.  Soft, nontender, nondistended, BS positive. CNS.  Alert and oriented x3.  No focal neurologic deficit. Extremities.  No edema, no cyanosis, pulses intact and symmetrical. Psychiatry.  Judgment and insight appears impaired.  Data Reviewed: I have personally reviewed following labs and imaging studies  CBC: Recent Labs  Lab 11/27/19 0353  WBC 6.5  HGB 8.6*  HCT 27.9*  MCV 71.9*  PLT 382*   Basic Metabolic Panel: Recent Labs  Lab 11/25/19 0924 11/26/19 0834 11/29/19 0448  NA 134* 135  --   K 3.9 4.0  --   CL 94* 95*  --  CO2 26 23  --   GLUCOSE 86 90  --   BUN 63* 62*  --   CREATININE 3.17* 3.15* 3.08*  CALCIUM 9.2 9.5  --   PHOS 3.5 3.6  --    GFR: Estimated Creatinine Clearance: 23.1 mL/min (A) (by C-G formula based on SCr of 3.08 mg/dL (H)). Liver Function Tests: Recent Labs  Lab 11/25/19 0924 11/26/19 0834  ALBUMIN 2.4* 2.4*   No results for input(s): LIPASE, AMYLASE in the last 168 hours. No results for input(s): AMMONIA in the last 168 hours. Coagulation Profile: No results for input(s): INR, PROTIME in the last 168 hours. Cardiac Enzymes: No results for  input(s): CKTOTAL, CKMB, CKMBINDEX, TROPONINI in the last 168 hours. BNP (last 3 results) No results for input(s): PROBNP in the last 8760 hours. HbA1C: No results for input(s): HGBA1C in the last 72 hours. CBG: No results for input(s): GLUCAP in the last 168 hours. Lipid Profile: No results for input(s): CHOL, HDL, LDLCALC, TRIG, CHOLHDL, LDLDIRECT in the last 72 hours. Thyroid Function Tests: No results for input(s): TSH, T4TOTAL, FREET4, T3FREE, THYROIDAB in the last 72 hours. Anemia Panel: No results for input(s): VITAMINB12, FOLATE, FERRITIN, TIBC, IRON, RETICCTPCT in the last 72 hours. Sepsis Labs: No results for input(s): PROCALCITON, LATICACIDVEN in the last 168 hours.  No results found for this or any previous visit (from the past 240 hour(s)).    Radiology Studies: No results found.  Scheduled Meds: . apixaban  5 mg Oral BID  . ciprofloxacin  500 mg Oral Q breakfast  . epoetin (EPOGEN/PROCRIT) injection  20,000 Units Subcutaneous Weekly  . iron polysaccharides  150 mg Oral Daily  . LORazepam  1 mg Intramuscular Once  . mouth rinse  15 mL Mouth Rinse BID  . melatonin  2.5 mg Oral QHS  . midodrine  10 mg Oral TID WC  . multivitamin  1 tablet Oral QHS  . pantoprazole  40 mg Oral BID  . polyethylene glycol  17 g Oral Daily  . torsemide  40 mg Oral Daily  . traZODone  100 mg Oral QHS   Continuous Infusions:    LOS: 42 days   Time spent: 20 minutes  Lorella Nimrod, MD [facility]  If 7PM-7AM, please contact night-coverage [email or web address] 11/30/2019, 3:53 PM

## 2019-11-30 NOTE — Plan of Care (Signed)
Pt easily agitated, does not want to stay in bed. She says it's very uncomfortable.  Problem: Clinical Measurements: Goal: Will remain free from infection Outcome: Progressing   Problem: Nutrition: Goal: Adequate nutrition will be maintained Outcome: Progressing   Problem: Elimination: Goal: Will not experience complications related to bowel motility Outcome: Progressing Goal: Will not experience complications related to urinary retention Outcome: Progressing   Problem: Skin Integrity: Goal: Risk for impaired skin integrity will decrease Outcome: Progressing

## 2019-11-30 NOTE — Progress Notes (Signed)
[facility]  ROUNDING NOTE   Subjective:   Patient lying in bed, in no acute distress.She denies worsening SOB,nausea or vomiting. Her only complaint today was about her ingrown toe nails being painful.     Objective:  Vital signs in last 24 hours:  Temp:  [97.5 F (36.4 C)-98.4 F (36.9 C)] 98.2 F (36.8 C) (11/22 1125) Pulse Rate:  [88-96] 88 (11/22 1125) Resp:  [17-18] 18 (11/22 1125) BP: (100-125)/(69-101) 100/69 (11/22 1125) SpO2:  [94 %-100 %] 94 % (11/22 1125)  Weight change:  Filed Weights   11/25/19 0500 11/28/19 0500 11/29/19 0500  Weight: 128.4 kg 106.6 kg 106.6 kg    Intake/Output: No intake/output data recorded.   Intake/Output this shift:  Total I/O In: 240 [P.O.:240] Out: -   Physical Exam: General:  In no acute distress  Head  Normocephalic,atraumatic  Lungs:   Respirations even,unlabored, lungs clear  Heart: Regular rate and rhythm  Abdomen:   Non distended, nontender  Extremities:  1+peripheral edema  Neurologic:  Awake, alert, speech clear  Skin: No acute lesions or rashes    Basic Metabolic Panel: Recent Labs  Lab 11/25/19 0924 11/26/19 0834 11/29/19 0448  NA 134* 135  --   K 3.9 4.0  --   CL 94* 95*  --   CO2 26 23  --   GLUCOSE 86 90  --   BUN 63* 62*  --   CREATININE 3.17* 3.15* 3.08*  CALCIUM 9.2 9.5  --   PHOS 3.5 3.6  --     Liver Function Tests: Recent Labs  Lab 11/25/19 0924 11/26/19 0834  ALBUMIN 2.4* 2.4*   No results for input(s): LIPASE, AMYLASE in the last 168 hours. No results for input(s): AMMONIA in the last 168 hours.  CBC: Recent Labs  Lab 11/27/19 0353  WBC 6.5  HGB 8.6*  HCT 27.9*  MCV 71.9*  PLT 476*    Cardiac Enzymes: No results for input(s): CKTOTAL, CKMB, CKMBINDEX, TROPONINI in the last 168 hours.  BNP: Invalid input(s): POCBNP  CBG: No results for input(s): GLUCAP in the last 168 hours.  Microbiology: Results for orders placed or performed during the hospital  encounter of 10/19/19  Respiratory Panel by RT PCR (Flu A&B, Covid) - Nasopharyngeal Swab     Status: None   Collection Time: 10/19/19 11:13 PM   Specimen: Nasopharyngeal Swab  Result Value Ref Range Status   SARS Coronavirus 2 by RT PCR NEGATIVE NEGATIVE Final    Comment: (NOTE) SARS-CoV-2 target nucleic acids are NOT DETECTED.  The SARS-CoV-2 RNA is generally detectable in upper respiratoy specimens during the acute phase of infection. The lowest concentration of SARS-CoV-2 viral copies this assay can detect is 131 copies/mL. A negative result does not preclude SARS-Cov-2 infection and should not be used as the sole basis for treatment or other patient management decisions. A negative result may occur with  improper specimen collection/handling, submission of specimen other than nasopharyngeal swab, presence of viral mutation(s) within the areas targeted by this assay, and inadequate number of viral copies (<131 copies/mL). A negative result must be combined with clinical observations, patient history, and epidemiological information. The expected result is Negative.  Fact Sheet for Patients:  PinkCheek.be  Fact Sheet for Healthcare Providers:  GravelBags.it  This test is no t yet approved or cleared by the [place] FDA and  has been authorized for detection and/or diagnosis of SARS-CoV-2 by FDA under an Emergency Use Authorization (EUA). This EUA will  remain  in effect (meaning this test can be used) for the duration of the COVID-19 declaration under Section 564(b)(1) of the Act, 21 U.S.C. section 360bbb-3(b)(1), unless the authorization is terminated or revoked sooner.     Influenza A by PCR NEGATIVE NEGATIVE Final   Influenza B by PCR NEGATIVE NEGATIVE Final    Comment: (NOTE) The Xpert Xpress SARS-CoV-2/FLU/RSV assay is intended as an aid in  the diagnosis of influenza from Nasopharyngeal swab specimens and   should not be used as a sole basis for treatment. Nasal washings and  aspirates are unacceptable for Xpert Xpress SARS-CoV-2/FLU/RSV  testing.  Fact Sheet for Patients: PinkCheek.be  Fact Sheet for Healthcare Providers: GravelBags.it  This test is not yet approved or cleared by the [place] FDA and  has been authorized for detection and/or diagnosis of SARS-CoV-2 by  FDA under an Emergency Use Authorization (EUA). This EUA will remain  in effect (meaning this test can be used) for the duration of the  Covid-19 declaration under Section 564(b)(1) of the Act, 21  U.S.C. section 360bbb-3(b)(1), unless the authorization is  terminated or revoked. Performed at [facility], [address] [place], [place] [address]   MRSA PCR Screening     Status: None   Collection Time: 10/25/19  2:50 PM   Specimen: Nasopharyngeal  Result Value Ref Range Status   MRSA by PCR NEGATIVE NEGATIVE Final    Comment:        The GeneXpert MRSA Assay (FDA approved for NASAL specimens only), is one component of a comprehensive MRSA colonization surveillance program. It is not intended to diagnose MRSA infection nor to guide or monitor treatment for MRSA infections. Performed at [facility], [address] [place], [place] [address]   CULTURE, BLOOD (ROUTINE X 2) w Reflex to ID Panel     Status: None   Collection Time: 10/25/19  4:29 PM   Specimen: BLOOD  Result Value Ref Range Status   Specimen Description BLOOD CENTRAL LINE  Final   Special Requests   Final    BOTTLES DRAWN AEROBIC AND ANAEROBIC Blood Culture adequate volume   Culture   Final    NO GROWTH 5 DAYS Performed at [facility], [address] [place], [place] [address]    Report Status 10/30/2019 FINAL  Final  CULTURE, BLOOD (ROUTINE X 2) w Reflex to ID Panel     Status: None   Collection Time: 10/26/19  6:45 AM   Specimen: BLOOD  Result  Value Ref Range Status   Specimen Description BLOOD BLOOD LEFT HAND  Final   Special Requests   Final    BOTTLES DRAWN AEROBIC AND ANAEROBIC Blood Culture adequate volume   Culture   Final    NO GROWTH 5 DAYS Performed at [facility], [address], [place], [place] [address]    Report Status 10/31/2019 FINAL  Final  Body fluid culture     Status: None   Collection Time: 10/26/19 11:30 AM   Specimen: Fluid  Result Value Ref Range Status   Specimen Description   Final    FLUID Performed at [facility], [address] [place], [place] [address]    Special Requests   Final    PERITONEAL Performed at [facility], [address] [place],  [address]    Gram Stain   Final    ABUNDANT WBC PRESENT, PREDOMINANTLY PMN NO ORGANISMS SEEN    Culture   Final  NO GROWTH Performed at [facility], [place] [address], [place], [place] [address]    Report Status 10/30/2019 FINAL  Final  Body fluid culture     Status: None   Collection Time: 10/29/19  1:39 PM   Specimen: PATH Cytology Peritoneal fluid  Result Value Ref Range Status   Specimen Description   Final    PERITONEAL Performed at [facility], [address], [place], [place] [address]    Special Requests   Final    NONE Performed at [facility], [address] [place], [place] [address]    Gram Stain   Final    MODERATE WBC PRESENT, PREDOMINANTLY PMN NO ORGANISMS SEEN    Culture   Final    NO GROWTH 3 DAYS Performed at [facility], [place] [address] [place], [place] [address]    Report Status 11/02/2019 FINAL  Final  Gram stain     Status: None   Collection Time: 11/02/19  3:30 PM   Specimen: PATH Cytology Peritoneal fluid  Result Value Ref Range Status   Specimen Description PERITONEAL  Final   Special Requests NONE  Final   Gram Stain   Final    WBC SEEN RED BLOOD CELLS NO ORGANISMS SEEN Performed at [facility], [address], [place], [place] [address]    Report Status 11/02/2019 FINAL  Final    Coagulation Studies: No results for input(s): LABPROT, INR in the last 72 hours.  Urinalysis: No results for input(s): COLORURINE, LABSPEC, PHURINE, GLUCOSEU, HGBUR, BILIRUBINUR, KETONESUR, PROTEINUR, UROBILINOGEN, NITRITE, LEUKOCYTESUR in the last 72 hours.  Invalid input(s): APPERANCEUR    Imaging: No results found.   Medications:    . apixaban  5 mg Oral BID  . ciprofloxacin  500 mg Oral Q breakfast  . epoetin (EPOGEN/PROCRIT) injection  20,000 Units Subcutaneous Weekly  . iron polysaccharides  150 mg Oral Daily  . LORazepam  1 mg Intramuscular Once  . mouth rinse  15 mL Mouth Rinse BID  . melatonin  2.5 mg Oral QHS  . midodrine  10 mg Oral TID WC  . multivitamin  1 tablet Oral QHS  . pantoprazole  40 mg Oral BID  . polyethylene glycol  17 g Oral Daily  . torsemide  40 mg Oral Daily  . traZODone  100 mg Oral QHS   acetaminophen, albuterol, alum & mag hydroxide-simeth **AND** lidocaine, bisacodyl, calcium carbonate, hydrOXYzine, loperamide, ondansetron (ZOFRAN) IV, oxyCODONE-acetaminophen  Assessment/ Plan:  [name] is a 64 y.o. black female with congestive heart failure, hypertension, hyperlipidemia, gout, COPD, anemia, cocaine abuse who is admitted to [facility] on 10/19/2019 for Peripheral edema [R60.9] Acute CHF (congestive heart failure) ([place]) [I50.9] AKI (acute kidney injury) ([place]) [N17.9] Congestive heart failure, unspecified HF chronicity, unspecified heart failure type ([place]) [A07.6] Acute diastolic (congestive) heart failure ([place]) [I50.31]  1. Acute kidney injury on chronic kidney disease stage IIIB.     Lab Results  Component Value Date   CREATININE 3.08 (H) 11/29/2019   CREATININE 3.15 (H) 11/26/2019   CREATININE 3.17 (H) 11/25/2019  Renal function stays relatively stable Will continue monitoring Continue Torsemide  2. Hypotension with acute exacerbation of systolic and  diastolic congestive heart failure. Echocardiogram 06/22/19 EF of 25-30%. Normotensive today No acute respiratory distress Continues to be on supplemental O2  3. Anemia of Chronic Disease Hemoglobin 8.6 Patient is on Epogen 20,000 units weekly    LOS: 42 Linzi Ohlinger 11/22/202112:23 PM

## 2019-11-30 NOTE — Progress Notes (Addendum)
   11/30/19 1350  Clinical Encounter Type  Visited With Patient  Visit Type Follow-up  Referral From Chaplain  Consult/Referral To Chaplain  Chaplain stopped in to check on Pt. When chaplain arrived, chaplain was walking down the hall. Pt complained about her toenails and said they are growing in the quick.She is aware that she cannot get her toenails cut here, but was hoping she could be taken next door to her podiatrist, Dr. Cleda Mccreedy. Pt said herr nails really need clipping.  Chaplain told her that she would look into this for her. Pt also said she is feeling locked up and claustrophobic. Chaplain saw Pt cry for the first time. She admitted to being tired and fed up. Chaplain questioned her tears. Pt said that she needs some fresh air.

## 2019-11-30 NOTE — Progress Notes (Signed)
Nutrition Follow-up  DOCUMENTATION CODES:   Obesity unspecified  INTERVENTION:  Patient continues to decline all nutrition interventions.  Continue Rena-vit po QHS.  NUTRITION DIAGNOSIS:   Increased nutrient needs related to chronic illness (cirrhosis, CHF) as evidenced by estimated needs.  Ongoing.  GOAL:   Patient will meet greater than or equal to 90% of their needs  Progressing.  MONITOR:   PO intake, Supplement acceptance, Labs, Weight trends, Skin, I & O's  REASON FOR ASSESSMENT:   LOS    ASSESSMENT:   63 y.o. 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  Met with patient at bedside. She reports her appetite remains good. She is getting tired of food at hospital. Patient continues to eat 100% of meals. Patient continues to decline oral nutrition supplements and other interventions offered by RD.  Medications reviewed and include: Rena-vit QHS, Protonix, Miralax 17 grams daily, torsemide.   Labs reviewed.  Weights fluctuate significantly during admission between 95-128 kg. Currently documented to be 106.6 kg (235 lbs).  Diet Order:   Diet Order            Diet Heart Room service appropriate? Yes; Fluid consistency: Thin; Fluid restriction: 1500 mL Fluid  Diet effective now                EDUCATION NEEDS:   Education needs have been addressed  Skin:  Skin Assessment: Reviewed RN Assessment (non-pressure injury L leg)  Last BM:  11/9- type 4  Height:   Ht Readings from Last 1 Encounters:  11/13/19 $RemoveB'5\' 6"'EjLOnzbu$  (1.676 m)   Weight:   Wt Readings from Last 1 Encounters:  11/29/19 106.6 kg   Ideal Body Weight:  59 kg  BMI:  Body mass index is 37.93 kg/m.  Estimated Nutritional Needs:   Kcal:   1900-2200kcal/day  Protein:  95-110g/day  Fluid:  1.8-2L/day  Jacklynn Barnacle, MS, RD, LDN Pager number available on Amion

## 2019-12-01 DIAGNOSIS — K652 Spontaneous bacterial peritonitis: Secondary | ICD-10-CM | POA: Diagnosis not present

## 2019-12-01 NOTE — Progress Notes (Signed)
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  After admission to the hospital, she was diagnosed with acute on chronic combinedsystolic and diastolic congestive heart failurewith ejection fraction 25 to 30%. She was not responding to IV Lasix. She was seen by nephrology, temporary dialysis catheter was performed and she was dialyzed on 10/17 and 10/18.Patient also complaining of abdominal pain, she had a paracentesis, study showed possible spontaneous peritonitis, she was treated with Rocephin and Flagyl.  Patient had a worsening respiratory status on 10/17, was diagnosed with aspiration pneumonia. She was treated with Rocephin, Zithromax and Flagyl. Patient also had abdominal pain and a CT scan suspect ischemia, but seen by general surgery on 10/19, no need for surgery.  Patient was evaluated by psychiatry and she does not has decision-making capacity. Family does not want to participate in making decisions for her. DSS case was filed for guardianship and still pending. Difficult disposition due to unsafe discharge.  Assessment & Plan:  Spontaneous bacterial peritonitis: Resolved.  Repeat paracentesis showed significant decrease of white cell in ascites, antibiotics effective. However she still has a lot of neutrophils in 11/03/2019 fluid. Culture has been negative.Pt received many days ofRocephin, sincediscontinued.  -Continue cipro for ppx   Acute on chronic combined systolic and diastolic congestive heart  failure. Acute hypoxemic respiratory failure. -Echo from 06/22/2019 showed ejection fraction of 25 to 30% with grade 3 diastolic dysfunction. She was dialyzed x2, but now getting diuresed. Pt reported does not want to get dialysis again. - repeat CXR with improved edema 11/7 -nephrology c/s, appreciate recs was started on- torsemide 40 mg daily -Saturating well on her baseline oxygen requirement.  Liver cirrhosis with ascites:Noted. GI consulted previously in hospitalization (see prior notes). -Continue cipro for SBP ppx  Dependent edema: Improving. -Patient has lower abdominal, lower back wall & leg edema-multifactorial in the setting of cirrhosis, CHF, renal failure & debility -Creatinine/GFR: Stable.  No respiratory symptoms.  Patient seems comfortable. -Continue with torsemide.  Hypotension: Remained stable.  Continue midodrine 10 mg TID.    Acute renal failure on chronic kidney disease stage IIIb: baseline creatinine appears to be 1.66. Creatinine peaked at 4.16, currently stable with mild improvement. -No plan for further dialysis per renal. Pt reportedly does not want dialysis again -renal transitioned to torsemide 40 mg-continue same. -Periodic checking of renal function  Hyponatremia: Resolved.   -Continue to monitor.  She is asymptomatic.  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 However, there is no GI bleed observed while in the hospital. It is not safe to continue hold anticoagulation. Anticoagulation has now been restarted with eliquis.  -Monitor hemoglobin. -Continue Eliquis  Overgrown toenails: will need outpatient podiatry follow up  Anemia of chronic disease: Hemoglobin improved to 8.6 today. -In the setting of CKD.  H&H is stable between 7-8.  MCV: 71.2.  -cont iron supplement -No indication of transfusion at this time. -EPO was started by  nephrology.  Chronic thrombocytosis: Platelet: 522-->441-->430. Improving.  Hypokalemia:Resolved.  GERD: Continue PPI   Depression/insomnia: Continue trazodone  Vaginal Itching:  Resolved.  Chronic constipation: Cont. MiraLAX.  Morbid obesity. Body mass index is 37.97 kg/m.  Goals of care: appreciate palliative care involvement.  Complex situation overall.  She's now DNR with no plans for dialysis.  GOC are often difficult due to her orientation and limited insight at times, but she's been relatively consistent in her overall goals (no dialysis or heroic measures, consistent with her DNR).  Limited insight at times makes discharge planning more complex.  She was seen earlier by psych on 10/21 who noted that "she is mostly unable to make decisions for herself safely about her overall treatment". On 11/8, she attempted to leave against medical advice.  No safe DC plan was identified at that time.  Per Dr. Weber Cooks, she does not have capacity for safe decisions about living situation.  Overall a difficult situation, DSS working with [facility] team as well. Family does not want to take decisions for her.  DVT prophylaxis: Eliquis Code Status: DNR Family Communication: Discussed with patient.  Disposition Plan: await SNF placement, we are waiting for guardianship appointed by the court in order to send her to SNF which is taking forever. Patient did had an interview on Monday for guardianship, still no final recommendations.  Consultants:  Palliative Nephrology GI  Procedures:  10/20 perma cath insertion perma cath removal 11/5  Antimicrobials:   As per chart  Status is: Inpatient  Dispo: The patient is from: home              Anticipated d/c is to: Pending              Anticipated d/c date is: >3 days              Patient currently medically stable for the discharge.  Subjective:  Patient has no new complaint but she wants to go home.  She was refusing to go to any facility,  stating that she will only go back to her own home.  Objective: Vitals:   12/01/19 0452 12/01/19 0804 12/01/19 1143 12/01/19 1617  BP:  (!) 116/91 110/89 108/85  Pulse:  98 66 97  Resp:  18 18 18   Temp:  97.7 F (36.5 C) 97.8 F (36.6 C) 98 F (36.7 C)  TempSrc:   Oral Oral  SpO2:  96% 94% 100%  Weight: 106.7 kg     Height:        Intake/Output Summary (Last 24 hours) at 12/01/2019 1700 Last data filed at 12/01/2019 1011 Gross per 24 hour  Intake 120 ml  Output --  Net 120 ml   Filed Weights   11/28/19 0500 11/29/19 0500 12/01/19 0452  Weight: 106.6 kg 106.6 kg 106.7 kg    Examination:  General.  Morbidly obese lady, in no acute distress. Pulmonary.  Lungs clear bilaterally, normal respiratory effort. CV.  Regular rate and rhythm, no JVD, rub or murmur. Abdomen.  Soft, nontender, nondistended, BS positive. CNS.  Alert and oriented x3.  No focal neurologic deficit. Extremities.  No edema, no cyanosis, pulses intact and symmetrical. Psychiatry.  Judgment and insight appears impaired.  Data Reviewed: I have personally reviewed following labs and imaging studies  CBC: Recent Labs  Lab 11/27/19 0353  WBC 6.5  HGB 8.6*  HCT 27.9*  MCV 71.9*  PLT 124*   Basic Metabolic Panel: Recent Labs  Lab 11/25/19 0924 11/26/19 0834 11/29/19 0448  NA 134* 135  --   K 3.9 4.0  --   CL 94* 95*  --  CO2 26 23  --   GLUCOSE 86 90  --   BUN 63* 62*  --   CREATININE 3.17* 3.15* 3.08*  CALCIUM 9.2 9.5  --   PHOS 3.5 3.6  --    GFR: Estimated Creatinine Clearance: 23.1 mL/min (A) (by C-G formula based on SCr of 3.08 mg/dL (H)). Liver Function Tests: Recent Labs  Lab 11/25/19 0924 11/26/19 0834  ALBUMIN 2.4* 2.4*   No results for input(s): LIPASE, AMYLASE in the last 168 hours. No results for input(s): AMMONIA in the last 168 hours. Coagulation Profile: No results for input(s): INR, PROTIME in the last 168 hours. Cardiac Enzymes: No results for input(s): CKTOTAL,  CKMB, CKMBINDEX, TROPONINI in the last 168 hours. BNP (last 3 results) No results for input(s): PROBNP in the last 8760 hours. HbA1C: No results for input(s): HGBA1C in the last 72 hours. CBG: No results for input(s): GLUCAP in the last 168 hours. Lipid Profile: No results for input(s): CHOL, HDL, LDLCALC, TRIG, CHOLHDL, LDLDIRECT in the last 72 hours. Thyroid Function Tests: No results for input(s): TSH, T4TOTAL, FREET4, T3FREE, THYROIDAB in the last 72 hours. Anemia Panel: No results for input(s): VITAMINB12, FOLATE, FERRITIN, TIBC, IRON, RETICCTPCT in the last 72 hours. Sepsis Labs: No results for input(s): PROCALCITON, LATICACIDVEN in the last 168 hours.  No results found for this or any previous visit (from the past 240 hour(s)).    Radiology Studies: No results found.  Scheduled Meds: . apixaban  5 mg Oral BID  . ciprofloxacin  500 mg Oral Q breakfast  . epoetin (EPOGEN/PROCRIT) injection  20,000 Units Subcutaneous Weekly  . iron polysaccharides  150 mg Oral Daily  . LORazepam  1 mg Intramuscular Once  . mouth rinse  15 mL Mouth Rinse BID  . melatonin  2.5 mg Oral QHS  . midodrine  10 mg Oral TID WC  . multivitamin  1 tablet Oral QHS  . pantoprazole  40 mg Oral BID  . polyethylene glycol  17 g Oral Daily  . torsemide  40 mg Oral Daily  . traZODone  100 mg Oral QHS   Continuous Infusions:    LOS: 43 days   Time spent: 20 minutes  Lorella Nimrod, MD [facility]  If 7PM-7AM, please contact night-coverage [email or web address] 12/01/2019, 5:00 PM

## 2019-12-01 NOTE — Progress Notes (Signed)
Palliative-   Note that DSS is in process of guardianship evaluation. Patient has been deemed not to have capacity for decision making.  Primary issues for now are disposition.   Palliative will sign off as there does not seem to be an appropriate avenue for any of our interventions. Please feel free to reconsult as needed.   Katelyn Lane, AGNP-C Palliative Medicine  Please call Palliative Medicine team phone with any questions [phone]. For individual providers please see AMION.  No charge

## 2019-12-01 NOTE — Progress Notes (Signed)
Physical Therapy Treatment Patient Details Name: Katelyn Lane MRN: [id number] DOB: [birth year] Today's Date: 12/01/2019    History of Present Illness Pt is a 64 yo female who presented to ER secondary to LE edema, progressive SOB; admitted for management of acute/chronic CHF, spontaneous bacterial peritonitis (s/p paracentesis 10/25).  Hospital course also significant for initiation of dialysis (initially, R temp fem cath; converted to R permcath).  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    PT Comments    Pt was pleasant and motivated to participate during the session.  Pt continued to require min A for BLE management during sit to sup but was steady with transfers and ambulation with good cadence while walking.  Pt fatigued after amb 125' this session and required to return to sitting with mod SOB but SpO2 and HR were WNL and pt's SOB resolved quickly.  Pt will benefit from HHPT services upon discharge to safely address deficits listed in patient problem list for decreased caregiver assistance and eventual return to PLOF.     Follow Up Recommendations  Home health PT;Supervision/Assistance - 24 hour     Equipment Recommendations  Rolling walker with 5" wheels;3in1 (PT)    Recommendations for Other Services       Precautions / Restrictions Precautions Precautions: Fall Restrictions Weight Bearing Restrictions: No Other Position/Activity Restrictions: Impulsive, self directs care, very easily agitated    Mobility  Bed Mobility Overal bed mobility: Needs Assistance Bed Mobility: Supine to  Sit;Sit to Supine     Supine to sit: Supervision Sit to supine: Min assist   General bed mobility comments: Min A for BLE management during sit to sup  Transfers Overall transfer level: Needs assistance Equipment used: Rolling walker (2 wheeled) Transfers: Sit to/from Stand Sit to Stand: Supervision;From elevated surface         General transfer comment: Extra effort to stand from an elevated EOB but with good control and stability  Ambulation/Gait Ambulation/Gait assistance: Min guard;Supervision Gait Distance (Feet): 125 Feet Assistive device: Rolling walker (2 wheeled) Gait Pattern/deviations: Step-through pattern;Trunk flexed;Decreased step length - right;Decreased step length - left Gait velocity: decreased   General Gait Details: Pt steady with amb with good cadence but with decreased activity tolerance compared to prior session with pt only able to amb a max of 125' before requiring to return to sitting with mod SOB but with SpO2 >/= 95% on room air and HR WNL   Stairs             Wheelchair Mobility    Modified Rankin (Stroke Patients Only)       Balance Overall balance assessment: Needs assistance Sitting-balance support: Feet supported;No upper extremity supported Sitting balance-Leahy Scale: Good     Standing balance support: Bilateral upper extremity supported;During functional activity Standing balance-Leahy Scale: Fair Standing balance comment: Min to mod lean on the RW for stability                            Cognition Arousal/Alertness: Awake/alert Behavior During Therapy: Impulsive Overall Cognitive Status: Within Functional Limits for tasks assessed  Exercises Other Exercises Other Exercises: Static and dynamic sitting at the EOB for core strengthening and activity tolerance    General Comments        Pertinent Vitals/Pain Pain Assessment: No/denies pain    Home  Living                      Prior Function            PT Goals (current goals can now be found in the care plan section) Progress towards PT goals: Progressing toward goals    Frequency    Min 2X/week      PT Plan Current plan remains appropriate    Co-evaluation              AM-PAC PT "6 Clicks" Mobility   Outcome Measure  Help needed turning from your back to your side while in a flat bed without using bedrails?: A Little Help needed moving from lying on your back to sitting on the side of a flat bed without using bedrails?: A Little Help needed moving to and from a bed to a chair (including a wheelchair)?: A Little Help needed standing up from a chair using your arms (e.g., wheelchair or bedside chair)?: A Little Help needed to walk in hospital room?: A Little Help needed climbing 3-5 steps with a railing? : A Little 6 Click Score: 18    End of Session Equipment Utilized During Treatment: Gait belt Activity Tolerance: Patient tolerated treatment well Patient left: in bed;with call bell/phone within reach;Other (comment) (All bed rails up per patient request) Nurse Communication: Mobility status PT Visit Diagnosis: Muscle weakness (generalized) (M62.81);Difficulty in walking, not elsewhere classified (R26.2)     Time: 1583-0940 PT Time Calculation (min) (ACUTE ONLY): 27 min  Charges:  $Gait Training: 8-22 mins $Therapeutic Activity: 8-22 mins                     D. Scott Thierry Dobosz PT, DPT 12/01/19, 5:20 PM

## 2019-12-01 NOTE — Plan of Care (Signed)
  Problem: Education: Goal: Knowledge of General Education information will improve Description: Including pain rating scale, medication(s)/side effects and non-pharmacologic comfort measures Outcome: Progressing   Problem: Health Behavior/Discharge Planning: Goal: Ability to manage health-related needs will improve Outcome: Progressing   Problem: Clinical Measurements: Goal: Ability to maintain clinical measurements within normal limits will improve Outcome: Progressing Goal: Will remain free from infection Outcome: Progressing Goal: Diagnostic test results will improve Outcome: Progressing Goal: Respiratory complications will improve Outcome: Progressing Goal: Cardiovascular complication will be avoided Outcome: Progressing   Problem: Activity: Goal: Risk for activity intolerance will decrease Outcome: Progressing   Problem: Nutrition: Goal: Adequate nutrition will be maintained Outcome: Progressing   Problem: Coping: Goal: Level of anxiety will decrease Outcome: Progressing   Problem: Elimination: Goal: Will not experience complications related to bowel motility Outcome: Progressing Goal: Will not experience complications related to urinary retention Outcome: Progressing  Pt overall non compliant - continues to pick and choose meds she will take on a daily basis Reminded pt to use call bell rather than yell when she needs assistance and pt voiced understanding

## 2019-12-02 DIAGNOSIS — K652 Spontaneous bacterial peritonitis: Secondary | ICD-10-CM | POA: Diagnosis not present

## 2019-12-02 DIAGNOSIS — J9601 Acute respiratory failure with hypoxia: Secondary | ICD-10-CM | POA: Diagnosis not present

## 2019-12-02 DIAGNOSIS — I5041 Acute combined systolic (congestive) and diastolic (congestive) heart failure: Secondary | ICD-10-CM | POA: Diagnosis not present

## 2019-12-02 NOTE — Plan of Care (Signed)
  Problem: Education: Goal: Knowledge of General Education information will improve Description: Including pain rating scale, medication(s)/side effects and non-pharmacologic comfort measures Outcome: Progressing   Problem: Health Behavior/Discharge Planning: Goal: Ability to manage health-related needs will improve Outcome: Progressing   Problem: Clinical Measurements: Goal: Ability to maintain clinical measurements within normal limits will improve Outcome: Progressing Goal: Will remain free from infection Outcome: Progressing Goal: Diagnostic test results will improve Outcome: Progressing Goal: Respiratory complications will improve Outcome: Progressing Goal: Cardiovascular complication will be avoided Outcome: Progressing   Problem: Activity: Goal: Risk for activity intolerance will decrease Outcome: Progressing   Problem: Nutrition: Goal: Adequate nutrition will be maintained Outcome: Progressing   Problem: Coping: Goal: Level of anxiety will decrease Outcome: Progressing   Problem: Elimination: Goal: Will not experience complications related to bowel motility Outcome: Progressing Goal: Will not experience complications related to urinary retention Outcome: Progressing   Problem: Pain Managment: Goal: General experience of comfort will improve Outcome: Progressing   Problem: Safety: Goal: Ability to remain free from injury will improve Outcome: Progressing   Problem: Skin Integrity: Goal: Risk for impaired skin integrity will decrease Outcome: Progressing   Problem: Education: Goal: Ability to demonstrate management of disease process will improve Outcome: Progressing Goal: Ability to verbalize understanding of medication therapies will improve Outcome: Progressing Goal: Individualized Educational Video(s) Outcome: Progressing   Problem: Activity: Goal: Capacity to carry out activities will improve Outcome: Progressing   Problem: Cardiac: Goal:  Ability to achieve and maintain adequate cardiopulmonary perfusion will improve Outcome: Progressing   Problem: Education: Goal: Knowledge of disease and its progression will improve Outcome: Progressing Goal: Individualized Educational Video(s) Outcome: Progressing   Problem: Fluid Volume: Goal: Compliance with measures to maintain balanced fluid volume will improve Outcome: Progressing   Problem: Health Behavior/Discharge Planning: Goal: Ability to manage health-related needs will improve Outcome: Progressing   Problem: Nutritional: Goal: Ability to make healthy dietary choices will improve Outcome: Progressing   Problem: Clinical Measurements: Goal: Complications related to the disease process, condition or treatment will be avoided or minimized Outcome: Progressing

## 2019-12-02 NOTE — Progress Notes (Signed)
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  After admission to the hospital, she was diagnosed with acute on chronic combinedsystolic and diastolic congestive heart failurewith ejection fraction 25 to 30%. She was not responding to IV Lasix. She was seen by nephrology, temporary dialysis catheter was performed and she was dialyzed on 10/17 and 10/18.Patient also complaining of abdominal pain, she had a paracentesis, study showed possible spontaneous peritonitis, she was treated with Rocephin and Flagyl.  Patient had a worsening respiratory status on 10/17, was diagnosed with aspiration pneumonia. She was treated with Rocephin, Zithromax and Flagyl. Patient also had abdominal pain and a CT scan suspect ischemia, but seen by general surgery on 10/19, no need for surgery.  Patient was evaluated by psychiatry and she does not has decision-making capacity. Family does not want to participate in making decisions for her. DSS case was filed for guardianship and still pending. Difficult disposition due to unsafe discharge.  Assessment & Plan:   Principal Problem:   Spontaneous bacterial peritonitis ([place]) Active Problems:   AKI (acute kidney injury) ([place] )   Crack cocaine use   Acute respiratory failure with hypoxia (HCC)   LV (left ventricular) mural thrombus   Chronic kidney disease (CKD) stage G3b/A1, moderately decreased glomerular filtration rate (GFR) between 30-44 mL/min/1.73 square meter and albuminuria creatinine ratio less than 30 mg/g  (HCC)   Nonsustained ventricular tachycardia (HCC)   Acute CHF (congestive heart failure) (HCC)   Cocaine abuse ([place])   Peripheral edema   Cirrhosis of liver (HCC)   Dementia (HCC)   Aspiration pneumonia of both lower lobes due to gastric secretions (HCC)   Acute diastolic (congestive) heart failure ([place])   Cardiorenal disease   Goals of care, counseling/discussion   Palliative care by specialist   Ascites due to alcoholic cirrhosis ([place])   Spontaneous bacterial peritonitis: Resolved.  Repeat paracentesis showed significant decrease of white cell in ascites, antibiotics effective. However she still has a lot of neutrophils in 11/03/2019 fluid. Culture has been negative.Pt received many days ofRocephin, sincediscontinued.  -Continue cipro for ppx   Acute on chronic combined systolic and diastolic congestive heart failure. Acute hypoxemic respiratory failure. -Echo from 06/22/2019 showed ejection fraction of 25 to 30% with grade 3 diastolic dysfunction. She was dialyzed x2, but now getting diuresed. Pt reported does not want to get dialysis again. - repeat CXR with improved edema 11/7 -nephrology c/s, appreciate recs was started on- torsemide 40 mg daily -Continues on minimal O2 support  Liver cirrhosis with ascites:Noted. GI consulted previously in hospitalization (see prior notes). -Continue cipro for SBP prophylaxis  Dependent edema: Improving. -Patient has lower abdominal, lower back wall & leg edema-multifactorial in the setting of cirrhosis, CHF, renal failure & debility -Creatinine/GFR: Stable.  No respiratory symptoms.  Patient seems comfortable. -Continue with torsemide.  Hypotension: Remained stable.  Continue midodrine 10 mg TID.    Acute renal failure on chronic kidney disease stage IIIb: baseline creatinine appears to be 1.66. Creatinine peaked at 4.16, currently stable with mild improvement. -No plan for further dialysis per renal. Pt reportedly does  not want dialysis again -renal transitioned  to torsemide 40 mg-continue same. 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 However, there is no GI bleed observed while in the hospital. It is not safe to continue hold anticoagulation. Anticoagulation has now been restarted with eliquis.  -Monitor hemoglobin. -Continue Eliquis as tolerated  Overgrown toenails: will need outpatient podiatry follow up  Anemia of chronic disease: Hemoglobin improved to 8.6 today. -In the setting of CKD.  H&H remains stable around 7-8. -cont iron supplement -No indication of transfusion at this time. -EPO was started by nephrology.  Chronic thrombocytosis:  -Stable at this time, suspect reactive  Hypokalemia:Resolved.  GERD: Continue PPI   Depression/insomnia: Continue trazodone  Vaginal Itching: Resolved.  Chronic constipation: Cont. MiraLAX.  Morbid obesity. Body mass index is 37.97 kg/m.  DVT prophylaxis: Eliquis Code Status: DNR Family Communication: Pt in room, family not at bedside  Status is: Inpatient  Remains inpatient appropriate because:Unsafe d/c plan   Dispo: The patient is from: Home              Anticipated d/c is to: SNF              Anticipated d/c date is: > 3 days              Patient currently is medically stable to d/c. Pending dispo planning       Consultants:  Palliative Nephrology GI  Procedures:  10/20 perma cath insertion perma cath removal 11/5  Antimicrobials: Anti-infectives (From admission, onward)   Start     Dose/Rate Route Frequency Ordered Stop   11/14/19 0800  ciprofloxacin (CIPRO) tablet 500 mg        500 mg Oral Daily with breakfast 11/13/19 1938     11/01/19 1000  cefTRIAXone (ROCEPHIN) 2 g in sodium chloride 0.9 % 100 mL IVPB  Status:  Discontinued        2 g 200  mL/hr over 30 Minutes Intravenous Daily 10/31/19 1521 11/09/19 1425   10/27/19 1000  cefTRIAXone (ROCEPHIN) 2 g in sodium chloride 0.9 % 100 mL IVPB  Status:  Discontinued        2 g 200 mL/hr over 30 Minutes Intravenous Daily 10/26/19 1559 10/31/19 1521   10/26/19 2000  cefTRIAXone (ROCEPHIN) 1 g in sodium chloride 0.9 % 100 mL IVPB        1 g 200 mL/hr over 30 Minutes Intravenous  Once 10/26/19 1559 10/26/19 2024   10/26/19 1600  cefTRIAXone (ROCEPHIN) 2 g in sodium chloride 0.9 % 100 mL IVPB  Status:  Discontinued        2 g 200 mL/hr over 30 Minutes Intravenous Every 24 hours 10/26/19 1351 10/26/19 1600   10/26/19 1000  metroNIDAZOLE (FLAGYL) IVPB 500 mg  Status:  Discontinued        500 mg 100 mL/hr over 60 Minutes Intravenous Every 8 hours 10/26/19 0909 10/29/19 1138   10/25/19 1445  azithromycin (ZITHROMAX) 500 mg in sodium chloride 0.9 % 250 mL IVPB  Status:  Discontinued        500 mg 250 mL/hr over 60 Minutes Intravenous Every 24 hours 10/25/19 1353 10/29/19 1138   10/25/19 1230  azithromycin (ZITHROMAX) tablet 500 mg  Status:  Discontinued        500 mg Oral Daily 10/25/19 1133 10/25/19 1134   10/25/19 1030  cefTRIAXone (ROCEPHIN) 1 g in sodium chloride 0.9 % 100  mL IVPB  Status:  Discontinued        1 g 200 mL/hr over 30 Minutes Intravenous Every 12 hours 10/25/19 0931 10/26/19 1351       Subjective: Pleasantly confused this AM  Objective: Vitals:   12/02/19 0015 12/02/19 0335 12/02/19 0800 12/02/19 1150  BP: 108/81 103/80 (!) 119/95 110/84  Pulse: 97 89 100 72  Resp: 20 20 18 18   Temp: 97.9 F (36.6 C) (!) 97.5 F (36.4 C) 97.9 F (36.6 C) 97.6 F (36.4 C)  TempSrc: Oral Oral    SpO2: 100% 97% 99% 100%  Weight:      Height:        Intake/Output Summary (Last 24 hours) at 12/02/2019 1421 Last data filed at 12/02/2019 0900 Gross per 24 hour  Intake 0 ml  Output --  Net 0 ml   Filed Weights   11/28/19 0500 11/29/19 0500 12/01/19 0452  Weight: 106.6 kg  106.6 kg 106.7 kg    Examination:  General exam: Appears calm and comfortable  Respiratory system: Clear to auscultation. Respiratory effort normal. Cardiovascular system: S1 & S2 heard, Regular Gastrointestinal system: Abdomen is nondistended, soft and nontender. No organomegaly or masses felt. Normal bowel sounds heard. Central nervous system: Alert and oriented. No focal neurological deficits. Extremities: Symmetric 5 x 5 power. Skin: No rashes, lesions  Psychiatry: Judgement and insight appear normal. Mood & affect appropriate.   Data Reviewed: I have personally reviewed following labs and imaging studies  CBC: Recent Labs  Lab 11/27/19 0353  WBC 6.5  HGB 8.6*  HCT 27.9*  MCV 71.9*  PLT 967*   Basic Metabolic Panel: Recent Labs  Lab 11/26/19 0834 11/29/19 0448  NA 135  --   K 4.0  --   CL 95*  --   CO2 23  --   GLUCOSE 90  --   BUN 62*  --   CREATININE 3.15* 3.08*  CALCIUM 9.5  --   PHOS 3.6  --    GFR: Estimated Creatinine Clearance: 23.1 mL/min (A) (by C-G formula based on SCr of 3.08 mg/dL (H)). Liver Function Tests: Recent Labs  Lab 11/26/19 0834  ALBUMIN 2.4*   No results for input(s): LIPASE, AMYLASE in the last 168 hours. No results for input(s): AMMONIA in the last 168 hours. Coagulation Profile: No results for input(s): INR, PROTIME in the last 168 hours. Cardiac Enzymes: No results for input(s): CKTOTAL, CKMB, CKMBINDEX, TROPONINI in the last 168 hours. BNP (last 3 results) No results for input(s): PROBNP in the last 8760 hours. HbA1C: No results for input(s): HGBA1C in the last 72 hours. CBG: No results for input(s): GLUCAP in the last 168 hours. Lipid Profile: No results for input(s): CHOL, HDL, LDLCALC, TRIG, CHOLHDL, LDLDIRECT in the last 72 hours. Thyroid Function Tests: No results for input(s): TSH, T4TOTAL, FREET4, T3FREE, THYROIDAB in the last 72 hours. Anemia Panel: No results for input(s): VITAMINB12, FOLATE, FERRITIN, TIBC,  IRON, RETICCTPCT in the last 72 hours. Sepsis Labs: No results for input(s): PROCALCITON, LATICACIDVEN in the last 168 hours.  No results found for this or any previous visit (from the past 240 hour(s)).   Radiology Studies: No results found.  Scheduled Meds: . apixaban  5 mg Oral BID  . ciprofloxacin  500 mg Oral Q breakfast  . epoetin (EPOGEN/PROCRIT) injection  20,000 Units Subcutaneous Weekly  . iron polysaccharides  150 mg Oral Daily  . LORazepam  1 mg Intramuscular Once  . mouth rinse  15  mL Mouth Rinse BID  . melatonin  2.5 mg Oral QHS  . midodrine  10 mg Oral TID WC  . multivitamin  1 tablet Oral QHS  . pantoprazole  40 mg Oral BID  . polyethylene glycol  17 g Oral Daily  . torsemide  40 mg Oral Daily  . traZODone  100 mg Oral QHS   Continuous Infusions:   LOS: 44 days   Marylu Lund, MD [facility] Pager On Amion  If 7PM-7AM, please contact night-coverage 12/02/2019, 2:21 PM

## 2019-12-02 NOTE — Plan of Care (Signed)
Problem: Education: Goal: Knowledge of General Education information will improve Description: Including pain rating scale, medication(s)/side effects and non-pharmacologic comfort measures 12/02/2019 1203 by Cristela Blue, RN Outcome: Progressing 12/02/2019 1203 by Cristela Blue, RN Outcome: Progressing   Problem: Health Behavior/Discharge Planning: Goal: Ability to manage health-related needs will improve 12/02/2019 1203 by Cristela Blue, RN Outcome: Progressing 12/02/2019 1203 by Cristela Blue, RN Outcome: Progressing   Problem: Clinical Measurements: Goal: Ability to maintain clinical measurements within normal limits will improve 12/02/2019 1203 by Cristela Blue, RN Outcome: Progressing 12/02/2019 1203 by Cristela Blue, RN Outcome: Progressing Goal: Will remain free from infection 12/02/2019 1203 by Cristela Blue, RN Outcome: Progressing 12/02/2019 1203 by Cristela Blue, RN Outcome: Progressing Goal: Diagnostic test results will improve 12/02/2019 1203 by Cristela Blue, RN Outcome: Progressing 12/02/2019 1203 by Cristela Blue, RN Outcome: Progressing Goal: Respiratory complications will improve 12/02/2019 1203 by Cristela Blue, RN Outcome: Progressing 12/02/2019 1203 by Cristela Blue, RN Outcome: Progressing Goal: Cardiovascular complication will be avoided 12/02/2019 1203 by Cristela Blue, RN Outcome: Progressing 12/02/2019 1203 by Cristela Blue, RN Outcome: Progressing   Problem: Activity: Goal: Risk for activity intolerance will decrease 12/02/2019 1203 by Cristela Blue, RN Outcome: Progressing 12/02/2019 1203 by Cristela Blue, RN Outcome: Progressing   Problem: Nutrition: Goal: Adequate nutrition will be maintained 12/02/2019 1203 by Cristela Blue, RN Outcome: Progressing 12/02/2019 1203 by Cristela Blue, RN Outcome: Progressing   Problem: Coping: Goal: Level of anxiety will decrease 12/02/2019 1203 by Cristela Blue, RN Outcome:  Progressing 12/02/2019 1203 by Cristela Blue, RN Outcome: Progressing   Problem: Elimination: Goal: Will not experience complications related to bowel motility 12/02/2019 1203 by Cristela Blue, RN Outcome: Progressing 12/02/2019 1203 by Cristela Blue, RN Outcome: Progressing Goal: Will not experience complications related to urinary retention 12/02/2019 1203 by Cristela Blue, RN Outcome: Progressing 12/02/2019 1203 by Cristela Blue, RN Outcome: Progressing   Problem: Pain Managment: Goal: General experience of comfort will improve 12/02/2019 1203 by Cristela Blue, RN Outcome: Progressing 12/02/2019 1203 by Cristela Blue, RN Outcome: Progressing   Problem: Safety: Goal: Ability to remain free from injury will improve 12/02/2019 1203 by Cristela Blue, RN Outcome: Progressing 12/02/2019 1203 by Cristela Blue, RN Outcome: Progressing   Problem: Skin Integrity: Goal: Risk for impaired skin integrity will decrease 12/02/2019 1203 by Cristela Blue, RN Outcome: Progressing 12/02/2019 1203 by Cristela Blue, RN Outcome: Progressing   Problem: Education: Goal: Ability to demonstrate management of disease process will improve 12/02/2019 1203 by Cristela Blue, RN Outcome: Progressing 12/02/2019 1203 by Cristela Blue, RN Outcome: Progressing Goal: Ability to verbalize understanding of medication therapies will improve 12/02/2019 1203 by Cristela Blue, RN Outcome: Progressing 12/02/2019 1203 by Cristela Blue, RN Outcome: Progressing Goal: Individualized Educational Video(s) 12/02/2019 1203 by Cristela Blue, RN Outcome: Progressing 12/02/2019 1203 by Cristela Blue, RN Outcome: Progressing   Problem: Activity: Goal: Capacity to carry out activities will improve 12/02/2019 1203 by Cristela Blue, RN Outcome: Progressing 12/02/2019 1203 by Cristela Blue, RN Outcome: Progressing   Problem: Cardiac: Goal: Ability to achieve and maintain adequate  cardiopulmonary perfusion will improve 12/02/2019 1203 by Cristela Blue, RN Outcome: Progressing 12/02/2019 1203 by Cristela Blue, RN Outcome: Progressing   Problem: Education: Goal: Knowledge of disease and its progression will improve 12/02/2019 1203 by Cristela Blue, RN Outcome: Progressing 12/02/2019 1203 by Cristela Blue, RN Outcome: Progressing Goal: Individualized Educational Video(s) 12/02/2019 1203 by Cristela Blue, RN Outcome: Progressing 12/02/2019 1203 by Cristela Blue, RN Outcome: Progressing   Problem: Fluid Volume: Goal: Compliance with measures  to maintain balanced fluid volume will improve 12/02/2019 1203 by Cristela Blue, RN Outcome: Progressing 12/02/2019 1203 by Cristela Blue, RN Outcome: Progressing   Problem: Health Behavior/Discharge Planning: Goal: Ability to manage health-related needs will improve 12/02/2019 1203 by Cristela Blue, RN Outcome: Progressing 12/02/2019 1203 by Cristela Blue, RN Outcome: Progressing   Problem: Nutritional: Goal: Ability to make healthy dietary choices will improve 12/02/2019 1203 by Cristela Blue, RN Outcome: Progressing 12/02/2019 1203 by Cristela Blue, RN Outcome: Progressing   Problem: Clinical Measurements: Goal: Complications related to the disease process, condition or treatment will be avoided or minimized 12/02/2019 1203 by Cristela Blue, RN Outcome: Progressing 12/02/2019 1203 by Cristela Blue, RN Outcome: Progressing

## 2019-12-02 NOTE — Progress Notes (Signed)
   12/02/19 1235  Clinical Encounter Type  Visited With Patient;Health care provider  Visit Type Follow-up  Referral From Chaplain  Consult/Referral To Chaplain  While rounding chaplain stopped in to see Pt. When se entered the room, Pt was on the commode. Pt told chaplain she wanted 2 towels and 2 wash cloths. Chaplain went to the nurses' station with this request. Pt also wanted to get off the commode. When NT, Jarrett Soho tried to assist Pt, she was rude and agitated. Pt even snapped at chaplain. Chaplain told Pt she came by to check on the situation with her feet. Chaplain went to nurses' station and asked Nurse Lonn Georgia to check Pt feet to see if there was any validity to what Pt was complaining about. Nurse checked her feet and her nails were long. I will check back with Pt on Friday. Pt tried to change how she was acting toward chaplain and chaplain told her it was alright and that she just wanted ot get something done about her feet. Before chaplain left, Pt said God bless you and have a Happy Thanksgiving.

## 2019-12-03 DIAGNOSIS — K652 Spontaneous bacterial peritonitis: Secondary | ICD-10-CM | POA: Diagnosis not present

## 2019-12-03 DIAGNOSIS — I5041 Acute combined systolic (congestive) and diastolic (congestive) heart failure: Secondary | ICD-10-CM | POA: Diagnosis not present

## 2019-12-03 LAB — CBC
HCT: 29.7 % — ABNORMAL LOW (ref 36.0–46.0)
Hemoglobin: 9 g/dL — ABNORMAL LOW (ref 12.0–15.0)
MCH: 22.4 pg — ABNORMAL LOW (ref 26.0–34.0)
MCHC: 30.3 g/dL (ref 30.0–36.0)
MCV: 73.9 fL — ABNORMAL LOW (ref 80.0–100.0)
Platelets: 375 10*3/uL (ref 150–400)
RBC: 4.02 MIL/uL (ref 3.87–5.11)
RDW: 22.5 % — ABNORMAL HIGH (ref 11.5–15.5)
WBC: 6.6 10*3/uL (ref 4.0–10.5)
nRBC: 0 % (ref 0.0–0.2)

## 2019-12-03 LAB — CREATININE, SERUM
Creatinine, Ser: 3.28 mg/dL — ABNORMAL HIGH (ref 0.44–1.00)
GFR, Estimated: 15 mL/min — ABNORMAL LOW (ref >60)

## 2019-12-03 NOTE — Plan of Care (Signed)
Problem: Education: Goal: Knowledge of General Education information will improve Description: Including pain rating scale, medication(s)/side effects and non-pharmacologic comfort measures 12/03/2019 1126 by Cristela Blue, RN Outcome: Progressing 12/03/2019 1126 by Cristela Blue, RN Outcome: Progressing   Problem: Health Behavior/Discharge Planning: Goal: Ability to manage health-related needs will improve 12/03/2019 1126 by Cristela Blue, RN Outcome: Progressing 12/03/2019 1126 by Cristela Blue, RN Outcome: Progressing   Problem: Clinical Measurements: Goal: Ability to maintain clinical measurements within normal limits will improve 12/03/2019 1126 by Cristela Blue, RN Outcome: Progressing 12/03/2019 1126 by Cristela Blue, RN Outcome: Progressing Goal: Will remain free from infection 12/03/2019 1126 by Cristela Blue, RN Outcome: Progressing 12/03/2019 1126 by Cristela Blue, RN Outcome: Progressing Goal: Diagnostic test results will improve 12/03/2019 1126 by Cristela Blue, RN Outcome: Progressing 12/03/2019 1126 by Cristela Blue, RN Outcome: Progressing Goal: Respiratory complications will improve 12/03/2019 1126 by Cristela Blue, RN Outcome: Progressing 12/03/2019 1126 by Cristela Blue, RN Outcome: Progressing Goal: Cardiovascular complication will be avoided 12/03/2019 1126 by Cristela Blue, RN Outcome: Progressing 12/03/2019 1126 by Cristela Blue, RN Outcome: Progressing   Problem: Activity: Goal: Risk for activity intolerance will decrease 12/03/2019 1126 by Cristela Blue, RN Outcome: Progressing 12/03/2019 1126 by Cristela Blue, RN Outcome: Progressing   Problem: Nutrition: Goal: Adequate nutrition will be maintained 12/03/2019 1126 by Cristela Blue, RN Outcome: Progressing 12/03/2019 1126 by Cristela Blue, RN Outcome: Progressing   Problem: Coping: Goal: Level of anxiety will decrease 12/03/2019 1126 by Cristela Blue, RN Outcome:  Progressing 12/03/2019 1126 by Cristela Blue, RN Outcome: Progressing   Problem: Elimination: Goal: Will not experience complications related to bowel motility 12/03/2019 1126 by Cristela Blue, RN Outcome: Progressing 12/03/2019 1126 by Cristela Blue, RN Outcome: Progressing Goal: Will not experience complications related to urinary retention 12/03/2019 1126 by Cristela Blue, RN Outcome: Progressing 12/03/2019 1126 by Cristela Blue, RN Outcome: Progressing   Problem: Pain Managment: Goal: General experience of comfort will improve 12/03/2019 1126 by Cristela Blue, RN Outcome: Progressing 12/03/2019 1126 by Cristela Blue, RN Outcome: Progressing   Problem: Safety: Goal: Ability to remain free from injury will improve 12/03/2019 1126 by Cristela Blue, RN Outcome: Progressing 12/03/2019 1126 by Cristela Blue, RN Outcome: Progressing   Problem: Skin Integrity: Goal: Risk for impaired skin integrity will decrease 12/03/2019 1126 by Cristela Blue, RN Outcome: Progressing 12/03/2019 1126 by Cristela Blue, RN Outcome: Progressing   Problem: Education: Goal: Ability to demonstrate management of disease process will improve 12/03/2019 1126 by Cristela Blue, RN Outcome: Progressing 12/03/2019 1126 by Cristela Blue, RN Outcome: Progressing Goal: Ability to verbalize understanding of medication therapies will improve 12/03/2019 1126 by Cristela Blue, RN Outcome: Progressing 12/03/2019 1126 by Cristela Blue, RN Outcome: Progressing Goal: Individualized Educational Video(s) 12/03/2019 1126 by Cristela Blue, RN Outcome: Progressing 12/03/2019 1126 by Cristela Blue, RN Outcome: Progressing   Problem: Activity: Goal: Capacity to carry out activities will improve 12/03/2019 1126 by Cristela Blue, RN Outcome: Progressing 12/03/2019 1126 by Cristela Blue, RN Outcome: Progressing   Problem: Cardiac: Goal: Ability to achieve and maintain adequate  cardiopulmonary perfusion will improve 12/03/2019 1126 by Cristela Blue, RN Outcome: Progressing 12/03/2019 1126 by Cristela Blue, RN Outcome: Progressing   Problem: Education: Goal: Knowledge of disease and its progression will improve 12/03/2019 1126 by Cristela Blue, RN Outcome: Progressing 12/03/2019 1126 by Cristela Blue, RN Outcome: Progressing Goal: Individualized Educational Video(s) 12/03/2019 1126 by Cristela Blue, RN Outcome: Progressing 12/03/2019 1126 by Cristela Blue, RN Outcome: Progressing   Problem: Fluid Volume: Goal: Compliance with measures  to maintain balanced fluid volume will improve 12/03/2019 1126 by Cristela Blue, RN Outcome: Progressing 12/03/2019 1126 by Cristela Blue, RN Outcome: Progressing   Problem: Health Behavior/Discharge Planning: Goal: Ability to manage health-related needs will improve 12/03/2019 1126 by Cristela Blue, RN Outcome: Progressing 12/03/2019 1126 by Cristela Blue, RN Outcome: Progressing   Problem: Nutritional: Goal: Ability to make healthy dietary choices will improve 12/03/2019 1126 by Cristela Blue, RN Outcome: Progressing 12/03/2019 1126 by Cristela Blue, RN Outcome: Progressing   Problem: Clinical Measurements: Goal: Complications related to the disease process, condition or treatment will be avoided or minimized 12/03/2019 1126 by Cristela Blue, RN Outcome: Progressing 12/03/2019 1126 by Cristela Blue, RN Outcome: Progressing

## 2019-12-03 NOTE — Progress Notes (Signed)
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  After admission to the hospital, she was diagnosed with acute on chronic combinedsystolic and diastolic congestive heart failurewith ejection fraction 25 to 30%. She was not responding to IV Lasix. She was seen by nephrology, temporary dialysis catheter was performed and she was dialyzed on 10/17 and 10/18.Patient also complaining of abdominal pain, she had a paracentesis, study showed possible spontaneous peritonitis, she was treated with Rocephin and Flagyl.  Patient had a worsening respiratory status on 10/17, was diagnosed with aspiration pneumonia. She was treated with Rocephin, Zithromax and Flagyl. Patient also had abdominal pain and a CT scan suspect ischemia, but seen by general surgery on 10/19, no need for surgery.  Patient was evaluated by psychiatry and she does not has decision-making capacity. Family does not want to participate in making decisions for her. DSS case was filed for guardianship and still pending. Difficult disposition due to unsafe discharge.  Assessment & Plan:   Principal Problem:   Spontaneous bacterial peritonitis ([place]) Active Problems:   AKI (acute kidney injury) ([place])   Crack cocaine use   Acute respiratory failure with hypoxia (HCC)   LV (left ventricular) mural thrombus   Chronic kidney disease (CKD) stage G3b/A1, moderately decreased glomerular filtration rate (GFR) between 30-44 mL/min/1.73 square meter and albuminuria creatinine ratio less than 30 mg/g  (HCC)   Nonsustained ventricular tachycardia (HCC)   Acute CHF (congestive heart failure) (HCC)   Cocaine abuse ([place])   Peripheral edema   Cirrhosis of liver (HCC)   Dementia (HCC)   Aspiration pneumonia of both lower lobes due to gastric secretions (HCC)   Acute diastolic (congestive) heart failure ([place])   Cardiorenal disease   Goals of care, counseling/discussion   Palliative care by specialist   Ascites due to alcoholic cirrhosis ([place])   Spontaneous bacterial peritonitis: Resolved.  Repeat paracentesis showed significant decrease of white cell in ascites, antibiotics effective. However she still has a lot of neutrophils in 11/03/2019 fluid. Culture has been negative.Pt received many days ofRocephin, sincediscontinued.  -Pt is continued on cipro for prophylaxis   Acute on chronic combined systolic and diastolic congestive heart failure. Acute hypoxemic respiratory failure. -Echo from 06/22/2019 showed ejection fraction of 25 to 30% with grade 3 diastolic dysfunction. She was dialyzed x2, but now getting diuresed. Pt reported does not want to get dialysis again. - repeat CXR with improved edema 11/7 -nephrology c/s, appreciate recs was started on- torsemide 40 mg daily -Continues on minimal O2 support  Liver cirrhosis with ascites:Noted. GI consulted previously in hospitalization (see prior notes). -Continue cipro for SBP prophylaxis  Dependent edema: Improving. -Patient has lower abdominal, lower back wall & leg edema-multifactorial in the setting of cirrhosis, CHF, renal failure & debility -Creatinine/GFR: Stable.  No respiratory symptoms.  Patient seems comfortable. -Continue with torsemide.  Hypotension: Remained stable.  Continue midodrine 10 mg TID.    Acute renal failure on chronic kidney disease stage IIIb: baseline creatinine appears to be 1.66. Creatinine peaked at 4.16, currently stable with mild improvement. -No plan for further dialysis per renal.  Pt reportedly does not want dialysis  again -renal transitioned to torsemide 40 mg-continue same. 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 However, there is no GI bleed observed while in the hospital. It is not safe to continue hold anticoagulation. Anticoagulation has now been restarted with eliquis.  -Monitor hemoglobin. -Continue Eliquis as tolerated  Overgrown toenails: will need outpatient podiatry follow up  Anemia of chronic disease: Hemoglobin improved to 8.6 today. -In the setting of CKD.  H&H remains stable around 7-8. -cont iron supplement -No indication of transfusion at this time. -EPO was started by nephrology.  Chronic thrombocytosis:  -Stable at this time, suspect reactive  Hypokalemia:Resolved.  GERD: Continue PPI   Depression/insomnia: Continue trazodone  Vaginal Itching: Resolved.  Chronic constipation: Cont. MiraLAX.  Morbid obesity. Body mass index is 37.97 kg/m.  DVT prophylaxis: Eliquis Code Status: DNR Family Communication: Pt in room, family not at bedside  Status is: Inpatient  Remains inpatient appropriate because:Unsafe d/c plan   Dispo: The patient is from: Home              Anticipated d/c is to: SNF              Anticipated d/c date is: > 3 days              Patient currently is medically stable to d/c. Pending dispo planning       Consultants:  Palliative Nephrology GI  Procedures:  10/20 perma cath insertion perma cath removal 11/5  Antimicrobials: Anti-infectives (From admission, onward)   Start     Dose/Rate Route Frequency Ordered Stop   11/14/19 0800  ciprofloxacin (CIPRO) tablet 500 mg        500 mg Oral Daily with breakfast 11/13/19 1938     11/01/19 1000  cefTRIAXone (ROCEPHIN) 2 g in sodium chloride 0.9 % 100 mL IVPB  Status:  Discontinued         2 g 200 mL/hr over 30 Minutes Intravenous Daily 10/31/19 1521 11/09/19 1425   10/27/19 1000  cefTRIAXone (ROCEPHIN) 2 g in sodium chloride 0.9 % 100 mL IVPB  Status:  Discontinued        2 g 200 mL/hr over 30 Minutes Intravenous Daily 10/26/19 1559 10/31/19 1521   10/26/19 2000  cefTRIAXone (ROCEPHIN) 1 g in sodium chloride 0.9 % 100 mL IVPB        1 g 200 mL/hr over 30 Minutes Intravenous  Once 10/26/19 1559 10/26/19 2024   10/26/19 1600  cefTRIAXone (ROCEPHIN) 2 g in sodium chloride 0.9 % 100 mL IVPB  Status:  Discontinued        2 g 200 mL/hr over 30 Minutes Intravenous Every 24 hours 10/26/19 1351 10/26/19 1600   10/26/19 1000  metroNIDAZOLE (FLAGYL) IVPB 500 mg  Status:  Discontinued        500 mg 100 mL/hr over 60 Minutes Intravenous Every 8 hours 10/26/19 0909 10/29/19 1138   10/25/19 1445  azithromycin (ZITHROMAX) 500 mg in sodium chloride 0.9 % 250 mL IVPB  Status:  Discontinued        500 mg 250 mL/hr over 60 Minutes Intravenous Every 24 hours 10/25/19 1353 10/29/19 1138   10/25/19 1230  azithromycin (ZITHROMAX) tablet 500 mg  Status:  Discontinued        500 mg Oral Daily 10/25/19 1133 10/25/19 1134   10/25/19 1030  cefTRIAXone (ROCEPHIN) 1 g in sodium chloride  0.9 % 100 mL IVPB  Status:  Discontinued        1 g 200 mL/hr over 30 Minutes Intravenous Every 12 hours 10/25/19 0931 10/26/19 1351      Subjective: Without issues  Objective: Vitals:   12/02/19 1626 12/02/19 1938 12/02/19 2324 12/03/19 1145  BP: (!) 104/92 (!) 112/92 111/81 106/86  Pulse: 77 68 89 69  Resp: 18 16 (!) 22 18  Temp: 97.6 F (36.4 C) (!) 97.5 F (36.4 C) 98.1 F (36.7 C) 98.3 F (36.8 C)  TempSrc:      SpO2: 100% 100% 100% 100%  Weight:      Height:       No intake or output data in the 24 hours ending 12/03/19 1358 Filed Weights   11/28/19 0500 11/29/19 0500 12/01/19 0452  Weight: 106.6 kg 106.6 kg 106.7 kg    Examination: Respiratory system: Normal respiratory effort, no  wheezing Extremities: Perfused, no clubbing   Data Reviewed: I have personally reviewed following labs and imaging studies  CBC: Recent Labs  Lab 11/27/19 0353 12/03/19 0430  WBC 6.5 6.6  HGB 8.6* 9.0*  HCT 27.9* 29.7*  MCV 71.9* 73.9*  PLT 476* 259   Basic Metabolic Panel: Recent Labs  Lab 11/29/19 0448 12/03/19 0430  CREATININE 3.08* 3.28*   GFR: Estimated Creatinine Clearance: 21.7 mL/min (A) (by C-G formula based on SCr of 3.28 mg/dL (H)). Liver Function Tests: No results for input(s): AST, ALT, ALKPHOS, BILITOT, PROT, ALBUMIN in the last 168 hours. No results for input(s): LIPASE, AMYLASE in the last 168 hours. No results for input(s): AMMONIA in the last 168 hours. Coagulation Profile: No results for input(s): INR, PROTIME in the last 168 hours. Cardiac Enzymes: No results for input(s): CKTOTAL, CKMB, CKMBINDEX, TROPONINI in the last 168 hours. BNP (last 3 results) No results for input(s): PROBNP in the last 8760 hours. HbA1C: No results for input(s): HGBA1C in the last 72 hours. CBG: No results for input(s): GLUCAP in the last 168 hours. Lipid Profile: No results for input(s): CHOL, HDL, LDLCALC, TRIG, CHOLHDL, LDLDIRECT in the last 72 hours. Thyroid Function Tests: No results for input(s): TSH, T4TOTAL, FREET4, T3FREE, THYROIDAB in the last 72 hours. Anemia Panel: No results for input(s): VITAMINB12, FOLATE, FERRITIN, TIBC, IRON, RETICCTPCT in the last 72 hours. Sepsis Labs: No results for input(s): PROCALCITON, LATICACIDVEN in the last 168 hours.  No results found for this or any previous visit (from the past 240 hour(s)).   Radiology Studies: No results found.  Scheduled Meds: . apixaban  5 mg Oral BID  . ciprofloxacin  500 mg Oral Q breakfast  . epoetin (EPOGEN/PROCRIT) injection  20,000 Units Subcutaneous Weekly  . iron polysaccharides  150 mg Oral Daily  . LORazepam  1 mg Intramuscular Once  . mouth rinse  15 mL Mouth Rinse BID  . melatonin   2.5 mg Oral QHS  . midodrine  10 mg Oral TID WC  . multivitamin  1 tablet Oral QHS  . pantoprazole  40 mg Oral BID  . polyethylene glycol  17 g Oral Daily  . torsemide  40 mg Oral Daily  . traZODone  100 mg Oral QHS   Continuous Infusions:   LOS: 45 days   Marylu Lund, MD [facility] Pager On Amion  If 7PM-7AM, please contact night-coverage 12/03/2019, 1:58 PM

## 2019-12-04 DIAGNOSIS — K652 Spontaneous bacterial peritonitis: Secondary | ICD-10-CM | POA: Diagnosis not present

## 2019-12-04 DIAGNOSIS — N179 Acute kidney failure, unspecified: Secondary | ICD-10-CM | POA: Diagnosis not present

## 2019-12-04 DIAGNOSIS — I5041 Acute combined systolic (congestive) and diastolic (congestive) heart failure: Secondary | ICD-10-CM | POA: Diagnosis not present

## 2019-12-04 NOTE — Progress Notes (Signed)
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  After admission to the hospital, she was diagnosed with acute on chronic combinedsystolic and diastolic congestive heart failurewith ejection fraction 25 to 30%. She was not responding to IV Lasix. She was seen by nephrology, temporary dialysis catheter was performed and she was dialyzed on 10/17 and 10/18.Patient also complaining of abdominal pain, she had a paracentesis, study showed possible spontaneous peritonitis, she was treated with Rocephin and Flagyl.  Patient had a worsening respiratory status on 10/17, was diagnosed with aspiration pneumonia. She was treated with Rocephin, Zithromax and Flagyl. Patient also had abdominal pain and a CT scan suspect ischemia, but seen by general surgery on 10/19, no need for surgery.  Patient was evaluated by psychiatry and she does not has decision-making capacity. Family does not want to participate in making decisions for her. DSS case was filed for guardianship and still pending. Difficult disposition due to unsafe discharge.  Assessment & Plan:   Principal Problem:   Spontaneous bacterial peritonitis ([place]) Active Problems:   AKI (acute kidney injury) ([place])   Crack cocaine use   Acute respiratory failure with hypoxia (HCC)   LV (left ventricular) mural thrombus   Chronic kidney disease (CKD) stage G3b/A1, moderately decreased glomerular filtration rate (GFR) between 30-44 mL/min/1.73 square meter and albuminuria creatinine ratio less than 30 mg/g  (HCC)   Nonsustained ventricular tachycardia (HCC)   Acute CHF (congestive heart failure) (HCC)   Cocaine abuse ([place])   Peripheral edema   Cirrhosis of liver (HCC)   Dementia (HCC)   Aspiration pneumonia of both lower lobes due to gastric secretions (HCC)   Acute diastolic (congestive) heart failure ([place])   Cardiorenal disease   Goals of care, counseling/discussion   Palliative care by specialist   Ascites due to alcoholic cirrhosis ([place])   Spontaneous bacterial peritonitis: Resolved.  Repeat paracentesis showed significant decrease of white cell in ascites, antibiotics effective. However she still has a lot of neutrophils in 11/03/2019 fluid. Culture has been negative.Pt received many days ofRocephin, sincediscontinued.  -Pt is continued on cipro for prophylaxis. Stable this AM  Acute on chronic combined systolic and diastolic congestive heart failure. Acute hypoxemic respiratory failure. -Echo from 06/22/2019 showed ejection fraction of 25 to 30% with grade 3 diastolic dysfunction. She was dialyzed x2, but now getting diuresed. Pt reported does not want to get dialysis again. - repeat CXR with improved edema 11/7 -nephrology c/s, appreciate recs was started on- torsemide 40 mg daily -Continues on minimal O2 support  Liver cirrhosis with ascites:Noted. GI consulted previously in hospitalization (see prior notes). -Continue cipro for SBP prophylaxis  Dependent edema: Improving. -Patient has lower abdominal, lower back wall & leg edema-multifactorial in the setting of cirrhosis, CHF, renal failure & debility -Creatinine/GFR: Stable.  No respiratory symptoms.  Patient seems comfortable. -Continue with torsemide.  Hypotension: Remained stable.  Continue midodrine 10 mg TID.    Acute renal failure on chronic kidney disease stage IIIb: baseline creatinine appears to be 1.66. Creatinine peaked at 4.16, currently stable with mild improvement. -No plan for further  dialysis per renal. Pt reportedly does not  want dialysis again -renal transitioned to torsemide 40 mg-continue same. 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 However, there is no GI bleed observed while in the hospital. It is not safe to continue hold anticoagulation. Anticoagulation has now been restarted with eliquis.  -Monitor hemoglobin. -Continue Eliquis as tolerated  Overgrown toenails: will need outpatient podiatry follow up  Anemia of chronic disease: Hemoglobin improved to 8.6 today. -In the setting of CKD.  H&H remains stable around 7-8. -cont iron supplement -No indication of transfusion at this time. -EPO was started by nephrology.  Chronic thrombocytosis:  -Stable at this time, suspect reactive  Hypokalemia:Resolved.  GERD: Continue PPI   Depression/insomnia: Continue trazodone  Vaginal Itching: Resolved.  Chronic constipation: Cont. MiraLAX.  Morbid obesity. Body mass index is 37.68 kg/m.  DVT prophylaxis: Eliquis Code Status: DNR Family Communication: Pt in room, family not at bedside  Status is: Inpatient  Remains inpatient appropriate because:Unsafe d/c plan   Dispo: The patient is from: Home              Anticipated d/c is to: SNF              Anticipated d/c date is: > 3 days              Patient currently is medically stable to d/c. Pending dispo planning   Consultants:  Palliative Nephrology GI  Procedures:  10/20 perma cath insertion perma cath removal 11/5  Antimicrobials: Anti-infectives (From admission, onward)   Start     Dose/Rate Route Frequency Ordered Stop   11/14/19 0800  ciprofloxacin (CIPRO) tablet 500 mg        500 mg Oral Daily with breakfast 11/13/19 1938     11/01/19 1000  cefTRIAXone (ROCEPHIN) 2 g in sodium chloride 0.9 % 100 mL IVPB  Status:   Discontinued        2 g 200 mL/hr over 30 Minutes Intravenous Daily 10/31/19 1521 11/09/19 1425   10/27/19 1000  cefTRIAXone (ROCEPHIN) 2 g in sodium chloride 0.9 % 100 mL IVPB  Status:  Discontinued        2 g 200 mL/hr over 30 Minutes Intravenous Daily 10/26/19 1559 10/31/19 1521   10/26/19 2000  cefTRIAXone (ROCEPHIN) 1 g in sodium chloride 0.9 % 100 mL IVPB        1 g 200 mL/hr over 30 Minutes Intravenous  Once 10/26/19 1559 10/26/19 2024   10/26/19 1600  cefTRIAXone (ROCEPHIN) 2 g in sodium chloride 0.9 % 100 mL IVPB  Status:  Discontinued        2 g 200 mL/hr over 30 Minutes Intravenous Every 24 hours 10/26/19 1351 10/26/19 1600   10/26/19 1000  metroNIDAZOLE (FLAGYL) IVPB 500 mg  Status:  Discontinued        500 mg 100 mL/hr over 60 Minutes Intravenous Every 8 hours 10/26/19 0909 10/29/19 1138   10/25/19 1445  azithromycin (ZITHROMAX) 500 mg in sodium chloride 0.9 % 250 mL IVPB  Status:  Discontinued        500 mg 250 mL/hr over 60 Minutes Intravenous Every 24 hours 10/25/19 1353 10/29/19 1138   10/25/19 1230  azithromycin (ZITHROMAX) tablet 500 mg  Status:  Discontinued        500 mg Oral Daily 10/25/19 1133 10/25/19 1134   10/25/19 1030  cefTRIAXone (ROCEPHIN) 1 g in sodium chloride 0.9 %  100 mL IVPB  Status:  Discontinued        1 g 200 mL/hr over 30 Minutes Intravenous Every 12 hours 10/25/19 0931 10/26/19 1351      Subjective: No complaints this AM  Objective: Vitals:   12/04/19 0024 12/04/19 0411 12/04/19 0500 12/04/19 0818  BP: 115/90 102/85  110/78  Pulse: 90 74  93  Resp: 19 18  17   Temp: 97.8 F (36.6 C) 98.1 F (36.7 C)  97.9 F (36.6 C)  TempSrc:    Oral  SpO2: 100% 100%  99%  Weight:   105.9 kg   Height:       No intake or output data in the 24 hours ending 12/04/19 1434 Filed Weights   11/29/19 0500 12/01/19 0452 12/04/19 0500  Weight: 106.6 kg 106.7 kg 105.9 kg    Examination: General exam: Conversant, in no acute distress Respiratory system:  normal chest rise, clear, no audible wheezing  Data Reviewed: I have personally reviewed following labs and imaging studies  CBC: Recent Labs  Lab 12/03/19 0430  WBC 6.6  HGB 9.0*  HCT 29.7*  MCV 73.9*  PLT 948   Basic Metabolic Panel: Recent Labs  Lab 11/29/19 0448 12/03/19 0430  CREATININE 3.08* 3.28*   GFR: Estimated Creatinine Clearance: 21.6 mL/min (A) (by C-G formula based on SCr of 3.28 mg/dL (H)). Liver Function Tests: No results for input(s): AST, ALT, ALKPHOS, BILITOT, PROT, ALBUMIN in the last 168 hours. No results for input(s): LIPASE, AMYLASE in the last 168 hours. No results for input(s): AMMONIA in the last 168 hours. Coagulation Profile: No results for input(s): INR, PROTIME in the last 168 hours. Cardiac Enzymes: No results for input(s): CKTOTAL, CKMB, CKMBINDEX, TROPONINI in the last 168 hours. BNP (last 3 results) No results for input(s): PROBNP in the last 8760 hours. HbA1C: No results for input(s): HGBA1C in the last 72 hours. CBG: No results for input(s): GLUCAP in the last 168 hours. Lipid Profile: No results for input(s): CHOL, HDL, LDLCALC, TRIG, CHOLHDL, LDLDIRECT in the last 72 hours. Thyroid Function Tests: No results for input(s): TSH, T4TOTAL, FREET4, T3FREE, THYROIDAB in the last 72 hours. Anemia Panel: No results for input(s): VITAMINB12, FOLATE, FERRITIN, TIBC, IRON, RETICCTPCT in the last 72 hours. Sepsis Labs: No results for input(s): PROCALCITON, LATICACIDVEN in the last 168 hours.  No results found for this or any previous visit (from the past 240 hour(s)).   Radiology Studies: No results found.  Scheduled Meds: . apixaban  5 mg Oral BID  . ciprofloxacin  500 mg Oral Q breakfast  . epoetin (EPOGEN/PROCRIT) injection  20,000 Units Subcutaneous Weekly  . iron polysaccharides  150 mg Oral Daily  . LORazepam  1 mg Intramuscular Once  . mouth rinse  15 mL Mouth Rinse BID  . melatonin  2.5 mg Oral QHS  . midodrine  10 mg Oral  TID WC  . multivitamin  1 tablet Oral QHS  . pantoprazole  40 mg Oral BID  . polyethylene glycol  17 g Oral Daily  . torsemide  40 mg Oral Daily  . traZODone  100 mg Oral QHS   Continuous Infusions:   LOS: 46 days   Marylu Lund, MD [facility] Pager On Amion  If 7PM-7AM, please contact night-coverage 12/04/2019, 2:34 PM

## 2019-12-04 NOTE — Progress Notes (Signed)
Physical Therapy Treatment Patient Details Name: Katelyn Lane MRN: [id number] DOB: [birth year] Today's Date: 12/04/2019    History of Present Illness Pt is a 64 yo female who presented to ER secondary to LE edema, progressive SOB; admitted for management of acute/chronic CHF, spontaneous bacterial peritonitis (s/p paracentesis 10/25).  Hospital course also significant for initiation of dialysis (initially, R temp fem cath; converted to R permcath).  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    PT Comments    Pt was pleasant and motivated to participate during the session.  Pt required frequent self-directed therapeutic rest breaks between activities with occasional min to mod SOB but resolved quickly with rest and SpO2 and HR remained WNL throughout the session.  Pt performed multiple transfers both sit to/from stand as well as SPTs with good control and stability and only min cuing for general safety.  Pt was able to amb 200' this session with a RW with fair cadence and with no LOB including when just using one hand on the RW on occasion.  Pt will benefit from HHPT services upon discharge to safely address deficits listed in patient problem list for decreased caregiver assistance and eventual return to PLOF.     Follow Up Recommendations  Home health PT;Supervision/Assistance - 24 hour     Equipment Recommendations  Rolling walker with 5" wheels;3in1 (PT)    Recommendations for Other Services       Precautions / Restrictions Precautions Precautions: Fall Restrictions Other Position/Activity  Restrictions: Impulsive, self directs care, very easily agitated    Mobility  Bed Mobility Overal bed mobility: Needs Assistance Bed Mobility: Supine to Sit;Sit to Supine     Supine to sit: Supervision Sit to supine: Min assist   General bed mobility comments: Min A for BLE management during sit to sup  Transfers Overall transfer level: Needs assistance Equipment used: Rolling walker (2 wheeled);None Transfers: Sit to/from [facility] Sit to Stand: Supervision;From elevated surface Stand pivot transfers: Supervision       General transfer comment: Extra effort to stand from an elevated EOB but with good control and stability  Ambulation/Gait Ambulation/Gait assistance: Min guard;Supervision Gait Distance (Feet): 200 Feet Assistive device: Rolling walker (2 wheeled) Gait Pattern/deviations: Step-through pattern;Trunk flexed;Decreased step length - right;Decreased step length - left Gait velocity: decreased   General Gait Details: Fair cadence with no instability noted; pt ambulated on room air per pt preference with SpO2 remaining in the mid to upper 90s on room air and with HR WNL.   Stairs             Wheelchair Mobility    Modified Rankin (Stroke Patients Only)       Balance Overall balance assessment: Needs assistance Sitting-balance support: Feet supported;No upper extremity supported Sitting balance-Leahy Scale: Good     Standing balance support: Bilateral upper extremity supported;During functional activity Standing balance-Leahy Scale: Good Standing balance comment: Min lean on the RW for stability with pt able to take one hand off of the RW during ambulation on multiple occasions without LOB                            Cognition Arousal/Alertness:  Awake/alert Behavior During Therapy: Impulsive Overall Cognitive Status: Within Functional Limits for tasks assessed                                         Exercises Other Exercises Other Exercises: Static and dynamic sitting at the EOB with reaching activities outside BOS for core strengthening and activity tolerance    General Comments        Pertinent Vitals/Pain Pain Assessment: No/denies pain    Home Living                      Prior Function            PT Goals (current goals can now be found in the care plan section) Progress towards PT goals: Progressing toward goals    Frequency    Min 2X/week      PT Plan Current plan remains appropriate    Co-evaluation              AM-PAC PT "6 Clicks" Mobility   Outcome Measure  Help needed turning from your back to your side while in a flat bed without using bedrails?: A Little Help needed moving from lying on your back to sitting on the side of a flat bed without using bedrails?: A Little Help needed moving to and from a bed to a chair (including a wheelchair)?: A Little Help needed standing up from a chair using your arms (e.g., wheelchair or bedside chair)?: A Little Help needed to walk in hospital room?: A Little   6 Click Score: 15    End of Session Equipment Utilized During Treatment: Gait belt Activity Tolerance: Patient tolerated treatment well Patient left: in bed;with call bell/phone within reach;Other (comment) (All four bed rails up per patient request) Nurse Communication: Mobility status PT Visit Diagnosis: Muscle weakness (generalized) (M62.81);Difficulty in walking, not elsewhere classified (R26.2)     Time: 8657-8469 PT Time Calculation (min) (ACUTE ONLY): 41 min  Charges:  $Gait Training: 8-22 mins $Therapeutic Exercise: 8-22 mins $Therapeutic Activity: 8-22 mins                     D. Scott Moani Weipert PT, DPT 12/04/19, 5:05 PM

## 2019-12-05 DIAGNOSIS — I5031 Acute diastolic (congestive) heart failure: Secondary | ICD-10-CM

## 2019-12-05 DIAGNOSIS — K652 Spontaneous bacterial peritonitis: Secondary | ICD-10-CM | POA: Diagnosis not present

## 2019-12-05 NOTE — Progress Notes (Signed)
PROGRESS NOTE    Katelyn Lane  HDQ:[id number] DOB: 12-Apr-1955 DOA: 10/19/2019 PCP: [name], MD    Brief Narrative:  64 y.o.femalewith medical history significant forcombined diastolic and systolic heart failure, CKD stage IIIb, history of CVA, hypertension, hyperlipidemia, ventricular thrombus on Xarelto and cocaine use who presents with concerns of lower extremity edema and increasing shortness of breath.Found to have BNP greater than 4500, elevated troponin and AKI.  After admission to the hospital, she was diagnosed with acute on chronic combinedsystolic and diastolic congestive heart failurewith ejection fraction 25 to 30%. She was not responding to IV Lasix. She was seen by nephrology, temporary dialysis catheter was performed and she was dialyzed on 10/17 and 10/18.Patient also complaining of abdominal pain, she had a paracentesis, study showed possible spontaneous peritonitis, she was treated with Rocephin and Flagyl.  Patient had a worsening respiratory status on 10/17, was diagnosed with aspiration pneumonia. She was treated with Rocephin, Zithromax and Flagyl. Patient also had abdominal pain and a CT scan suspect ischemia, but seen by general surgery on 10/19, no need for surgery.  Patient was evaluated by psychiatry and she does not has decision-making capacity. Family does not want to participate in making decisions for her. DSS case was filed for guardianship and still pending. Difficult disposition due to unsafe discharge.  Assessment & Plan:   Principal Problem:   Spontaneous bacterial peritonitis ([place]) Active Problems:   AKI (acute kidney injury) ([place])   Crack cocaine use   Acute respiratory failure with hypoxia (HCC)   LV (left ventricular) mural thrombus   Chronic kidney disease (CKD) stage G3b/A1, moderately decreased glomerular filtration rate (GFR) between 30-44 mL/min/1.73 square meter and albuminuria creatinine ratio less than 30 mg/g  (HCC)   Nonsustained ventricular tachycardia (HCC)   Acute CHF (congestive heart failure) (HCC)   Cocaine abuse ([place])   Peripheral edema   Cirrhosis of liver (HCC)   Dementia (HCC)   Aspiration pneumonia of both lower lobes due to gastric secretions (HCC)   Acute diastolic (congestive) heart failure ([place])   Cardiorenal disease   Goals of care, counseling/discussion   Palliative care by specialist   Ascites due to alcoholic cirrhosis ([place])   Spontaneous bacterial peritonitis: Resolved.  Repeat paracentesis showed significant decrease of white cell in ascites, antibiotics effective. However she still has a lot of neutrophils in 11/03/2019 fluid. Culture has been negative.Pt received many days ofRocephin, sincediscontinued.  -Pt is continued on cipro for prophylaxis. Remains stable this AM  Acute on chronic combined systolic and diastolic congestive heart failure. Acute hypoxemic respiratory failure. -Echo from 06/22/2019 showed ejection fraction of 25 to 30% with grade 3 diastolic dysfunction. She was dialyzed x2, but now getting diuresed. Pt reported does not want to get dialysis again. - repeat CXR with improved edema 11/7 -nephrology c/s, appreciate recs was started on- torsemide 40 mg daily -Continues on minimal O2 support  Liver cirrhosis with ascites:Noted. GI consulted previously in hospitalization (see prior notes). -Continue cipro for SBP prophylaxis  Dependent edema: Improving. -Patient has lower abdominal, lower back wall & leg edema-multifactorial in the setting of cirrhosis, CHF, renal failure & debility -Creatinine/GFR: Stable.  No respiratory symptoms.  Patient seems comfortable. -Continue with torsemide.  Hypotension: Remained stable.  Continue midodrine 10 mg TID.    Acute renal failure on chronic kidney disease stage IIIb: baseline creatinine appears to be 1.66. Creatinine peaked at 4.16, currently stable with mild improvement. -No plan for further  dialysis per renal. Pt reportedly does  not want dialysis again -renal transitioned to torsemide 40 mg-continue same. 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 However, there is no GI bleed observed while in the hospital. It is not safe to continue hold anticoagulation. Anticoagulation has now been restarted with eliquis.  -Monitor hemoglobin. -Continue Eliquis as tolerated  Overgrown toenails: will need outpatient podiatry follow up  Anemia of chronic disease: Hemoglobin improved to 8.6 today. -In the setting of CKD.  H&H remains stable around 7-8. -cont iron supplement -No indication of transfusion at this time. -EPO was started by nephrology.  Chronic thrombocytosis:  -Stable at this time, suspect reactive  Hypokalemia:Resolved.  GERD: Continue PPI   Depression/insomnia: Continue trazodone  Vaginal Itching: Resolved.  Chronic constipation: Cont. MiraLAX.  Morbid obesity. Body mass index is 37.68 kg/m.  DVT prophylaxis: Eliquis Code Status: DNR Family Communication: Pt in room, family not at bedside  Status is: Inpatient  Remains inpatient appropriate because:Unsafe d/c plan   Dispo: The patient is from: Home              Anticipated d/c is to: SNF              Anticipated d/c date is: > 3 days              Patient currently is medically stable to d/c. Pending dispo planning   Consultants:  Palliative Nephrology GI  Procedures:  10/20 perma cath insertion perma cath removal 11/5  Antimicrobials: Anti-infectives (From admission, onward)   Start     Dose/Rate Route Frequency Ordered Stop   11/14/19 0800  ciprofloxacin (CIPRO) tablet 500 mg        500 mg Oral Daily with breakfast 11/13/19 1938     11/01/19 1000  cefTRIAXone (ROCEPHIN) 2 g in sodium chloride 0.9 % 100 mL IVPB  Status:   Discontinued        2 g 200 mL/hr over 30 Minutes Intravenous Daily 10/31/19 1521 11/09/19 1425   10/27/19 1000  cefTRIAXone (ROCEPHIN) 2 g in sodium chloride 0.9 % 100 mL IVPB  Status:  Discontinued        2 g 200 mL/hr over 30 Minutes Intravenous Daily 10/26/19 1559 10/31/19 1521   10/26/19 2000  cefTRIAXone (ROCEPHIN) 1 g in sodium chloride 0.9 % 100 mL IVPB        1 g 200 mL/hr over 30 Minutes Intravenous  Once 10/26/19 1559 10/26/19 2024   10/26/19 1600  cefTRIAXone (ROCEPHIN) 2 g in sodium chloride 0.9 % 100 mL IVPB  Status:  Discontinued        2 g 200 mL/hr over 30 Minutes Intravenous Every 24 hours 10/26/19 1351 10/26/19 1600   10/26/19 1000  metroNIDAZOLE (FLAGYL) IVPB 500 mg  Status:  Discontinued        500 mg 100 mL/hr over 60 Minutes Intravenous Every 8 hours 10/26/19 0909 10/29/19 1138   10/25/19 1445  azithromycin (ZITHROMAX) 500 mg in sodium chloride 0.9 % 250 mL IVPB  Status:  Discontinued        500 mg 250 mL/hr over 60 Minutes Intravenous Every 24 hours 10/25/19 1353 10/29/19 1138   10/25/19 1230  azithromycin (ZITHROMAX) tablet 500 mg  Status:  Discontinued        500 mg Oral Daily 10/25/19 1133 10/25/19 1134   10/25/19 1030  cefTRIAXone (ROCEPHIN) 1 g in sodium chloride 0.9 %  100 mL IVPB  Status:  Discontinued        1 g 200 mL/hr over 30 Minutes Intravenous Every 12 hours 10/25/19 0931 10/26/19 1351      Subjective: No complaints this AM. Reports feeling tired  Objective: Vitals:   12/05/19 0317 12/05/19 0529 12/05/19 0803 12/05/19 1157  BP: 113/89  124/89 105/90  Pulse: 71  69 70  Resp: 18  17 19   Temp: 98.1 F (36.7 C)  98.2 F (36.8 C) 97.8 F (36.6 C)  TempSrc:   Oral Oral  SpO2: 100%  100% 100%  Weight:  105 kg    Height:        Intake/Output Summary (Last 24 hours) at 12/05/2019 1406 Last data filed at 12/05/2019 1013 Gross per 24 hour  Intake 240 ml  Output --  Net 240 ml   Filed Weights   12/01/19 0452 12/04/19 0500 12/05/19 0529   Weight: 106.7 kg 105.9 kg 105 kg    Examination: General exam: Asleep, easily aroused, laying in bed, in nad Respiratory system: Normal respiratory effort, no wheezing  Data Reviewed: I have personally reviewed following labs and imaging studies  CBC: Recent Labs  Lab 12/03/19 0430  WBC 6.6  HGB 9.0*  HCT 29.7*  MCV 73.9*  PLT 270   Basic Metabolic Panel: Recent Labs  Lab 11/29/19 0448 12/03/19 0430  CREATININE 3.08* 3.28*   GFR: Estimated Creatinine Clearance: 21.5 mL/min (A) (by C-G formula based on SCr of 3.28 mg/dL (H)). Liver Function Tests: No results for input(s): AST, ALT, ALKPHOS, BILITOT, PROT, ALBUMIN in the last 168 hours. No results for input(s): LIPASE, AMYLASE in the last 168 hours. No results for input(s): AMMONIA in the last 168 hours. Coagulation Profile: No results for input(s): INR, PROTIME in the last 168 hours. Cardiac Enzymes: No results for input(s): CKTOTAL, CKMB, CKMBINDEX, TROPONINI in the last 168 hours. BNP (last 3 results) No results for input(s): PROBNP in the last 8760 hours. HbA1C: No results for input(s): HGBA1C in the last 72 hours. CBG: No results for input(s): GLUCAP in the last 168 hours. Lipid Profile: No results for input(s): CHOL, HDL, LDLCALC, TRIG, CHOLHDL, LDLDIRECT in the last 72 hours. Thyroid Function Tests: No results for input(s): TSH, T4TOTAL, FREET4, T3FREE, THYROIDAB in the last 72 hours. Anemia Panel: No results for input(s): VITAMINB12, FOLATE, FERRITIN, TIBC, IRON, RETICCTPCT in the last 72 hours. Sepsis Labs: No results for input(s): PROCALCITON, LATICACIDVEN in the last 168 hours.  No results found for this or any previous visit (from the past 240 hour(s)).   Radiology Studies: No results found.  Scheduled Meds: . apixaban  5 mg Oral BID  . ciprofloxacin  500 mg Oral Q breakfast  . epoetin (EPOGEN/PROCRIT) injection  20,000 Units Subcutaneous Weekly  . iron polysaccharides  150 mg Oral Daily  .  LORazepam  1 mg Intramuscular Once  . mouth rinse  15 mL Mouth Rinse BID  . melatonin  2.5 mg Oral QHS  . midodrine  10 mg Oral TID WC  . multivitamin  1 tablet Oral QHS  . pantoprazole  40 mg Oral BID  . polyethylene glycol  17 g Oral Daily  . torsemide  40 mg Oral Daily  . traZODone  100 mg Oral QHS   Continuous Infusions:   LOS: 47 days   Marylu Lund, MD [facility] Pager On Amion  If 7PM-7AM, please contact night-coverage 12/05/2019, 2:06 PM

## 2019-12-06 DIAGNOSIS — I5031 Acute diastolic (congestive) heart failure: Secondary | ICD-10-CM | POA: Diagnosis not present

## 2019-12-06 NOTE — Progress Notes (Signed)
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  After admission to the hospital, she was diagnosed with acute on chronic combinedsystolic and diastolic congestive heart failurewith ejection fraction 25 to 30%. She was not responding to IV Lasix. She was seen by nephrology, temporary dialysis catheter was performed and she was dialyzed on 10/17 and 10/18.Patient also complaining of abdominal pain, she had a paracentesis, study showed possible spontaneous peritonitis, she was treated with Rocephin and Flagyl.  Patient had a worsening respiratory status on 10/17, was diagnosed with aspiration pneumonia. She was treated with Rocephin, Zithromax and Flagyl. Patient also had abdominal pain and a CT scan suspect ischemia, but seen by general surgery on 10/19, no need for surgery.  Patient was evaluated by psychiatry and she does not has decision-making capacity. Family does not want to participate in making decisions for her. DSS case was filed for guardianship and still pending. Difficult disposition due to unsafe discharge.  Assessment & Plan:   Principal Problem:   Spontaneous bacterial peritonitis ([place]) Active Problems:   AKI (acute kidney injury) ([place])   Crack cocaine use   Acute respiratory failure with hypoxia (HCC)   LV (left ventricular) mural thrombus   Chronic kidney disease (CKD) stage G3b/A1, moderately decreased glomerular filtration rate (GFR) between 30-44 mL/min/1.73 square meter and albuminuria creatinine ratio less than 30 mg/g  (HCC)   Nonsustained ventricular tachycardia (HCC)   Acute CHF (congestive heart failure) (HCC)   Cocaine abuse ([place])   Peripheral edema   Cirrhosis of liver (HCC)   Dementia (HCC)   Aspiration pneumonia of both lower lobes due to gastric secretions (HCC)   Acute diastolic (congestive) heart failure ([place])   Cardiorenal disease   Goals of care, counseling/discussion   Palliative care by specialist   Ascites due to alcoholic cirrhosis ([place])   Spontaneous bacterial peritonitis: Resolved.  Repeat paracentesis showed significant decrease of white cell in ascites, antibiotics effective. However she still has a lot of neutrophils in 11/03/2019 fluid. Culture has been negative.Pt received many days ofRocephin, sincediscontinued.  -Pt is continued on cipro for prophylaxis. Currently remains stable this AM  Acute on chronic combined systolic and diastolic congestive heart failure. Acute hypoxemic respiratory failure. -Echo from 06/22/2019 showed ejection fraction of 25 to 30% with grade 3 diastolic dysfunction. She was dialyzed x2, but now getting diuresed. Pt reported does not want to get dialysis again. - repeat CXR with improved edema 11/7 -nephrology c/s, appreciate recs was started on- torsemide 40 mg daily -Continues on minimal O2 support  Liver cirrhosis with ascites:Noted. GI consulted previously in hospitalization (see prior notes). -Continue cipro for SBP prophylaxis  Dependent edema: Improving. -Patient has lower abdominal, lower back wall & leg edema-multifactorial in the setting of cirrhosis, CHF, renal failure & debility -Creatinine/GFR: Stable.  No respiratory symptoms.  Patient seems comfortable. -Continue with torsemide.  Hypotension: Remained stable.  Continue midodrine 10 mg TID.    Acute renal failure on chronic kidney disease stage IIIb: baseline creatinine appears to be 1.66. Creatinine peaked at 4.16, currently stable with mild improvement. -No plan  for further dialysis per renal. Pt reportedly  does not want dialysis again -renal transitioned to torsemide 40 mg-continue same. 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 However, there is no GI bleed observed while in the hospital. It is not safe to continue hold anticoagulation. Anticoagulation has now been restarted with eliquis.  -Monitor hemoglobin. -Continue Eliquis as tolerated  Overgrown toenails: will need outpatient podiatry follow up  Anemia of chronic disease: Hemoglobin improved to 8.6 today. -In the setting of CKD.  H&H remains stable around 7-8. -cont iron supplement -No indication of transfusion at this time. -EPO was started by nephrology.  Chronic thrombocytosis:  -Stable at this time, suspect reactive  Hypokalemia:Resolved.  GERD: Continue PPI   Depression/insomnia: Continue trazodone  Vaginal Itching: Resolved.  Chronic constipation: Cont. MiraLAX.  Morbid obesity. Body mass index is 37.68 kg/m.  Decision making capacity evaluated. No barriers to communication is present. The patient understands the overall medical situation However, the patient is unable to demonstrate insight to reasoning or rational for procedures recommended, such as dialysis. The patient did not demonstrate awareness of benefits or risks to treatment as well as lack of treatment.   DVT prophylaxis: Eliquis Code Status: DNR Family Communication: Pt in room, family not at bedside  Status is: Inpatient  Remains inpatient appropriate because:Unsafe d/c plan   Dispo: The patient is from: Home              Anticipated d/c is to: SNF              Anticipated d/c date is: > 3 days              Patient currently is medically stable to d/c. Pending dispo planning   Consultants:   Palliative Nephrology GI  Procedures:  10/20 perma cath insertion perma cath removal 11/5  Antimicrobials: Anti-infectives (From admission, onward)   Start     Dose/Rate Route Frequency Ordered Stop   11/14/19 0800  ciprofloxacin (CIPRO) tablet 500 mg        500 mg Oral Daily with breakfast 11/13/19 1938     11/01/19 1000  cefTRIAXone (ROCEPHIN) 2 g in sodium chloride 0.9 % 100 mL IVPB  Status:  Discontinued        2 g 200 mL/hr over 30 Minutes Intravenous Daily 10/31/19 1521 11/09/19 1425   10/27/19 1000  cefTRIAXone (ROCEPHIN) 2 g in sodium chloride 0.9 % 100 mL IVPB  Status:  Discontinued        2 g 200 mL/hr over 30 Minutes Intravenous Daily 10/26/19 1559 10/31/19 1521   10/26/19 2000  cefTRIAXone (ROCEPHIN) 1 g in sodium chloride 0.9 % 100 mL IVPB        1 g 200 mL/hr over 30 Minutes Intravenous  Once 10/26/19 1559 10/26/19 2024   10/26/19 1600  cefTRIAXone (ROCEPHIN) 2 g in sodium chloride 0.9 % 100 mL IVPB  Status:  Discontinued        2 g 200 mL/hr over 30 Minutes Intravenous Every 24 hours 10/26/19 1351 10/26/19 1600   10/26/19 1000  metroNIDAZOLE (FLAGYL) IVPB 500 mg  Status:  Discontinued        500 mg 100 mL/hr over 60 Minutes Intravenous Every 8 hours 10/26/19 0909 10/29/19 1138   10/25/19 1445  azithromycin (ZITHROMAX) 500 mg in sodium chloride 0.9 % 250 mL IVPB  Status:  Discontinued        500  mg 250 mL/hr over 60 Minutes Intravenous Every 24 hours 10/25/19 1353 10/29/19 1138   10/25/19 1230  azithromycin (ZITHROMAX) tablet 500 mg  Status:  Discontinued        500 mg Oral Daily 10/25/19 1133 10/25/19 1134   10/25/19 1030  cefTRIAXone (ROCEPHIN) 1 g in sodium chloride 0.9 % 100 mL IVPB  Status:  Discontinued        1 g 200 mL/hr over 30 Minutes Intravenous Every 12 hours 10/25/19 0931 10/26/19 1351      Subjective: Focused on wanting to leave to go to the bank because "I need to pay my bills"  Objective: Vitals:   12/05/19 1943 12/06/19 0005 12/06/19 0826  12/06/19 1117  BP: 112/90 (!) 107/94 102/78 (!) 111/94  Pulse: 68 69 74 71  Resp: 18 17 17 18   Temp: 98.7 F (37.1 C) 98 F (36.7 C) 98.2 F (36.8 C) 97.7 F (36.5 C)  TempSrc: Oral Oral Oral   SpO2:  100% 98% 99%  Weight:      Height:        Intake/Output Summary (Last 24 hours) at 12/06/2019 1240 Last data filed at 12/06/2019 1007 Gross per 24 hour  Intake 240 ml  Output --  Net 240 ml   Filed Weights   12/01/19 0452 12/04/19 0500 12/05/19 0529  Weight: 106.7 kg 105.9 kg 105 kg    Examination: General exam: Awake, laying in bed, in nad Respiratory system: Normal respiratory effort, no wheezing  Data Reviewed: I have personally reviewed following labs and imaging studies  CBC: Recent Labs  Lab 12/03/19 0430  WBC 6.6  HGB 9.0*  HCT 29.7*  MCV 73.9*  PLT 409   Basic Metabolic Panel: Recent Labs  Lab 12/03/19 0430  CREATININE 3.28*   GFR: Estimated Creatinine Clearance: 21.5 mL/min (A) (by C-G formula based on SCr of 3.28 mg/dL (H)). Liver Function Tests: No results for input(s): AST, ALT, ALKPHOS, BILITOT, PROT, ALBUMIN in the last 168 hours. No results for input(s): LIPASE, AMYLASE in the last 168 hours. No results for input(s): AMMONIA in the last 168 hours. Coagulation Profile: No results for input(s): INR, PROTIME in the last 168 hours. Cardiac Enzymes: No results for input(s): CKTOTAL, CKMB, CKMBINDEX, TROPONINI in the last 168 hours. BNP (last 3 results) No results for input(s): PROBNP in the last 8760 hours. HbA1C: No results for input(s): HGBA1C in the last 72 hours. CBG: No results for input(s): GLUCAP in the last 168 hours. Lipid Profile: No results for input(s): CHOL, HDL, LDLCALC, TRIG, CHOLHDL, LDLDIRECT in the last 72 hours. Thyroid Function Tests: No results for input(s): TSH, T4TOTAL, FREET4, T3FREE, THYROIDAB in the last 72 hours. Anemia Panel: No results for input(s): VITAMINB12, FOLATE, FERRITIN, TIBC, IRON, RETICCTPCT in the  last 72 hours. Sepsis Labs: No results for input(s): PROCALCITON, LATICACIDVEN in the last 168 hours.  No results found for this or any previous visit (from the past 240 hour(s)).   Radiology Studies: No results found.  Scheduled Meds: . apixaban  5 mg Oral BID  . ciprofloxacin  500 mg Oral Q breakfast  . epoetin (EPOGEN/PROCRIT) injection  20,000 Units Subcutaneous Weekly  . iron polysaccharides  150 mg Oral Daily  . LORazepam  1 mg Intramuscular Once  . mouth rinse  15 mL Mouth Rinse BID  . melatonin  2.5 mg Oral QHS  . midodrine  10 mg Oral TID WC  . multivitamin  1 tablet Oral QHS  . pantoprazole  40  mg Oral BID  . polyethylene glycol  17 g Oral Daily  . torsemide  40 mg Oral Daily  . traZODone  100 mg Oral QHS   Continuous Infusions:   LOS: 48 days   Marylu Lund, MD [facility] Pager On Amion  If 7PM-7AM, please contact night-coverage 12/06/2019, 12:40 PM

## 2019-12-06 NOTE — Plan of Care (Signed)

## 2019-12-07 DIAGNOSIS — I5031 Acute diastolic (congestive) heart failure: Secondary | ICD-10-CM | POA: Diagnosis not present

## 2019-12-07 DIAGNOSIS — K652 Spontaneous bacterial peritonitis: Secondary | ICD-10-CM | POA: Diagnosis not present

## 2019-12-07 LAB — CBC
HCT: 30.8 % — ABNORMAL LOW (ref 36.0–46.0)
Hemoglobin: 9 g/dL — ABNORMAL LOW (ref 12.0–15.0)
MCH: 22.6 pg — ABNORMAL LOW (ref 26.0–34.0)
MCHC: 29.2 g/dL — ABNORMAL LOW (ref 30.0–36.0)
MCV: 77.4 fL — ABNORMAL LOW (ref 80.0–100.0)
Platelets: 365 10*3/uL (ref 150–400)
RBC: 3.98 MIL/uL (ref 3.87–5.11)
RDW: 22.5 % — ABNORMAL HIGH (ref 11.5–15.5)
WBC: 5 10*3/uL (ref 4.0–10.5)
nRBC: 0 % (ref 0.0–0.2)

## 2019-12-07 LAB — CREATININE, SERUM
Creatinine, Ser: 2.81 mg/dL — ABNORMAL HIGH (ref 0.44–1.00)
GFR, Estimated: 18 mL/min — ABNORMAL LOW (ref >60)

## 2019-12-07 NOTE — Progress Notes (Signed)
Physical Therapy Treatment Patient Details Name: Katelyn Lane MRN: [id number] DOB: August 21, 1955 Today's Date: 12/07/2019    History of Present Illness Pt is a 64 yo female who presented to ER secondary to LE edema, progressive SOB; admitted for management of acute/chronic CHF, spontaneous bacterial peritonitis (s/p paracentesis 10/25).  Hospital course also significant for initiation of dialysis (initially, R temp fem cath; converted to R permcath).  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    PT Comments    Pt was pleasant and motivated to participate during the session.  Pt was steady with transfers to/from multiple height surfaces and presented with fair to good stability during dynamic standing balance activities.  Pt was able to amb 200' on room air (per pt choice) with SpO2 remaining >/= 90% during the session.  Pt was moderately SOB after amb but resolved quickly upon returning to sitting.  Pt required frequent short therapeutic rest breaks during the session but remained motivated to participate throughout the session.  Pt will benefit from HHPT services upon discharge to safely address deficits listed in patient problem list for decreased caregiver assistance and eventual return to PLOF.      Follow Up Recommendations  Home health PT;Supervision/Assistance - 24 hour     Equipment Recommendations  Rolling walker with 5" wheels;3in1 (PT)    Recommendations for Other Services       Precautions / Restrictions Precautions Precautions: Fall Restrictions Weight Bearing Restrictions: No Other  Position/Activity Restrictions: Impulsive, self directs care, very easily agitated    Mobility  Bed Mobility               General bed mobility comments: NT, pt found sitting at EOB  Transfers Overall transfer level: Needs assistance Equipment used: Rolling walker (2 wheeled);None Transfers: Sit to/from [facility] Sit to Stand: Supervision;From elevated surface Stand pivot transfers: Supervision       General transfer comment: Pt demonstrated good control and stability with transfers  Ambulation/Gait Ambulation/Gait assistance: Min guard;Supervision Gait Distance (Feet): 200 Feet Assistive device: Rolling walker (2 wheeled) Gait Pattern/deviations: Step-through pattern;Trunk flexed;Decreased step length - right;Decreased step length - left Gait velocity: decreased   General Gait Details: Fair cadence with no instability noted; pt ambulated on room air per pt preference with SpO2 remaining in the mid to upper 90s on room air and with HR WNL.  Min verbal cues for ambulation closer to the RW with upright posture for safety   Stairs             Wheelchair Mobility    Modified Rankin (Stroke Patients Only)       Balance Overall balance assessment: Needs assistance Sitting-balance support: Feet supported;No upper extremity supported Sitting balance-Leahy Scale: Good     Standing balance support: Bilateral upper extremity supported;During functional activity Standing balance-Leahy Scale: Good Standing balance comment: Min lean on the RW for stability with pt able to take one hand off of the RW during ambulation on multiple occasions without LOB                            Cognition Arousal/Alertness: Awake/alert Behavior During Therapy: Impulsive Overall Cognitive Status: Within Functional Limits  for tasks assessed                                        Exercises Other Exercises Other Exercises: Dynamic standing  balance training without UE support with reaching activities outside BOS    General Comments        Pertinent Vitals/Pain Pain Assessment: No/denies pain    Home Living                      Prior Function            PT Goals (current goals can now be found in the care plan section) Progress towards PT goals: Progressing toward goals    Frequency    Min 2X/week      PT Plan Current plan remains appropriate    Co-evaluation              AM-PAC PT "6 Clicks" Mobility   Outcome Measure  Help needed turning from your back to your side while in a flat bed without using bedrails?: A Little Help needed moving from lying on your back to sitting on the side of a flat bed without using bedrails?: A Little Help needed moving to and from a bed to a chair (including a wheelchair)?: A Little Help needed standing up from a chair using your arms (e.g., wheelchair or bedside chair)?: A Little Help needed to walk in hospital room?: A Little Help needed climbing 3-5 steps with a railing? : A Little 6 Click Score: 18    End of Session Equipment Utilized During Treatment: Gait belt Activity Tolerance: Patient tolerated treatment well Patient left: in chair;with call bell/phone within reach Nurse Communication: Mobility status;Other (comment) (CNA notified pt sitting in room chair resting before getting back into bed) PT Visit Diagnosis: Muscle weakness (generalized) (M62.81);Difficulty in walking, not elsewhere classified (R26.2)     Time: 2620-3559 PT Time Calculation (min) (ACUTE ONLY): 43 min  Charges:  $Gait Training: 8-22 mins $Therapeutic Exercise: 8-22 mins $Therapeutic Activity: 8-22 mins                     D. Scott Rozena Fierro PT, DPT 12/07/19, 1:42 PM

## 2019-12-07 NOTE — Progress Notes (Signed)
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  After admission to the hospital, she was diagnosed with acute on chronic combinedsystolic and diastolic congestive heart failurewith ejection fraction 25 to 30%. She was not responding to IV Lasix. She was seen by nephrology, temporary dialysis catheter was performed and she was dialyzed on 10/17 and 10/18.Patient also complaining of abdominal pain, she had a paracentesis, study showed possible spontaneous peritonitis, she was treated with Rocephin and Flagyl.  Patient had a worsening respiratory status on 10/17, was diagnosed with aspiration pneumonia. She was treated with Rocephin, Zithromax and Flagyl. Patient also had abdominal pain and a CT scan suspect ischemia, but seen by general surgery on 10/19, no need for surgery.  Patient was evaluated by psychiatry and she does not has decision-making capacity. Family does not want to participate in making decisions for her. DSS case was filed for guardianship and still pending. Difficult disposition due to unsafe discharge.  Assessment & Plan:   Principal Problem:   Spontaneous bacterial peritonitis ([place]) Active Problems:   AKI (acute kidney injury) ([place])   Crack cocaine use   Acute respiratory failure with hypoxia (HCC)   LV (left ventricular) mural thrombus   Chronic kidney disease (CKD) stage G3b/A1, moderately decreased glomerular filtration rate (GFR) between 30-44 mL/min/1.73 square meter and albuminuria creatinine ratio less than 30 mg/g  (HCC)   Nonsustained ventricular tachycardia (HCC)   Acute CHF (congestive heart failure) (HCC)   Cocaine abuse ([place])   Peripheral edema   Cirrhosis of liver (HCC)   Dementia (HCC)   Aspiration pneumonia of both lower lobes due to gastric secretions (HCC)   Acute diastolic (congestive) heart failure ([place])   Cardiorenal disease   Goals of care, counseling/discussion   Palliative care by specialist   Ascites due to alcoholic cirrhosis ([place])   Spontaneous bacterial peritonitis: Resolved.  Repeat paracentesis showed significant decrease of white cell in ascites, antibiotics effective. However she still has a lot of neutrophils in 11/03/2019 fluid. Culture has been negative.Pt received many days ofRocephin, sincediscontinued.  -Pt is continued on cipro for prophylaxis. Presently stable this AM  Acute on chronic combined systolic and diastolic congestive heart failure. Acute hypoxemic respiratory failure. -Echo from 06/22/2019 showed ejection fraction of 25 to 30% with grade 3 diastolic dysfunction. She was dialyzed x2, but now getting diuresed. Pt reported does not want to get dialysis again. - repeat CXR with improved edema 11/7 -nephrology c/s, appreciate recs was started on- torsemide 40 mg daily -Continues on minimal O2 support  Liver cirrhosis with ascites:Noted. GI consulted previously in hospitalization (see prior notes). -Continue cipro for SBP prophylaxis  Dependent edema: Improving. -Patient has lower abdominal, lower back wall & leg edema-multifactorial in the setting of cirrhosis, CHF, renal failure & debility -Creatinine/GFR: Stable.  No respiratory symptoms.  Patient seems comfortable. -Continue with torsemide.  Hypotension: Remained stable.  Continue midodrine 10 mg TID.    Acute renal failure on chronic kidney disease stage IIIb: baseline creatinine appears to be 1.66. Creatinine peaked at 4.16, currently stable with mild improvement. -No plan for  further dialysis per renal. Pt reportedly does  not want dialysis again -renal transitioned to torsemide 40 mg-continue same. 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 However, there is no GI bleed observed while in the hospital. It is not safe to continue hold anticoagulation. Anticoagulation has now been restarted with eliquis.  -Monitor hemoglobin. -Continue Eliquis as tolerated  Overgrown toenails: will need outpatient podiatry follow up  Anemia of chronic disease: Hemoglobin improved to 8.6 today. -In the setting of CKD.  H&H remains stable around 7-8. -cont iron supplement -No indication of transfusion at this time. -EPO was started by nephrology.  Chronic thrombocytosis:  -Stable at this time, suspect reactive  Hypokalemia:Resolved.  GERD: Continue PPI   Depression/insomnia: Continue trazodone  Vaginal Itching: Resolved.  Chronic constipation: Cont. MiraLAX.  Morbid obesity. Body mass index is 37.36 kg/m today  Decision making capacity evaluated. No barriers to communication is present. The patient understands the overall medical situation However, the patient is unable to demonstrate insight to reasoning or rational for procedures recommended, such as dialysis. The patient did not demonstrate awareness of benefits or risks to treatment as well as lack of treatment.   DVT prophylaxis: Eliquis Code Status: DNR Family Communication: Pt in room, family not at bedside  Status is: Inpatient  Remains inpatient appropriate because:Unsafe d/c plan   Dispo: The patient is from: Home              Anticipated d/c is to: SNF              Anticipated d/c date is: > 3 days              Patient currently is medically stable to d/c. Pending dispo planning   Consultants:   Palliative Nephrology GI  Procedures:  10/20 perma cath insertion perma cath removal 11/5  Antimicrobials: Anti-infectives (From admission, onward)   Start     Dose/Rate Route Frequency Ordered Stop   11/14/19 0800  ciprofloxacin (CIPRO) tablet 500 mg        500 mg Oral Daily with breakfast 11/13/19 1938     11/01/19 1000  cefTRIAXone (ROCEPHIN) 2 g in sodium chloride 0.9 % 100 mL IVPB  Status:  Discontinued        2 g 200 mL/hr over 30 Minutes Intravenous Daily 10/31/19 1521 11/09/19 1425   10/27/19 1000  cefTRIAXone (ROCEPHIN) 2 g in sodium chloride 0.9 % 100 mL IVPB  Status:  Discontinued        2 g 200 mL/hr over 30 Minutes Intravenous Daily 10/26/19 1559 10/31/19 1521   10/26/19 2000  cefTRIAXone (ROCEPHIN) 1 g in sodium chloride 0.9 % 100 mL IVPB        1 g 200 mL/hr over 30 Minutes Intravenous  Once 10/26/19 1559 10/26/19 2024   10/26/19 1600  cefTRIAXone (ROCEPHIN) 2 g in sodium chloride 0.9 % 100 mL IVPB  Status:  Discontinued        2 g 200 mL/hr over 30 Minutes Intravenous Every 24 hours 10/26/19 1351 10/26/19 1600   10/26/19 1000  metroNIDAZOLE (FLAGYL) IVPB 500 mg  Status:  Discontinued        500 mg 100 mL/hr over 60 Minutes Intravenous Every 8 hours 10/26/19 0909 10/29/19 1138   10/25/19 1445  azithromycin (ZITHROMAX) 500 mg in sodium chloride 0.9 % 250 mL IVPB  Status:  Discontinued        500  mg 250 mL/hr over 60 Minutes Intravenous Every 24 hours 10/25/19 1353 10/29/19 1138   10/25/19 1230  azithromycin (ZITHROMAX) tablet 500 mg  Status:  Discontinued        500 mg Oral Daily 10/25/19 1133 10/25/19 1134   10/25/19 1030  cefTRIAXone (ROCEPHIN) 1 g in sodium chloride 0.9 % 100 mL IVPB  Status:  Discontinued        1 g 200 mL/hr over 30 Minutes Intravenous Every 12 hours 10/25/19 0931 10/26/19 1351      Subjective: No issues  Objective: Vitals:   12/06/19 2059 12/07/19 0100 12/07/19 0347 12/07/19 0817  BP: 114/82 111/82 103/88 108/88  Pulse: 91 77 69 75   Resp: 18 18 17 18   Temp: 98.1 F (36.7 C) 97.6 F (36.4 C) 97.6 F (36.4 C) 97.7 F (36.5 C)  TempSrc:    Oral  SpO2: 98% 94% 99% 100%  Weight:      Height:       No intake or output data in the 24 hours ending 12/07/19 1321 Filed Weights   12/01/19 0452 12/04/19 0500 12/05/19 0529  Weight: 106.7 kg 105.9 kg 105 kg    Examination: General exam: asleep, in no acute distress Respiratory system: normal chest rise, clear, no audible wheezing  Data Reviewed: I have personally reviewed following labs and imaging studies  CBC: Recent Labs  Lab 12/03/19 0430 12/07/19 0516  WBC 6.6 5.0  HGB 9.0* 9.0*  HCT 29.7* 30.8*  MCV 73.9* 77.4*  PLT 375 034   Basic Metabolic Panel: Recent Labs  Lab 12/03/19 0430 12/07/19 0516  CREATININE 3.28* 2.81*   GFR: Estimated Creatinine Clearance: 25.1 mL/min (A) (by C-G formula based on SCr of 2.81 mg/dL (H)). Liver Function Tests: No results for input(s): AST, ALT, ALKPHOS, BILITOT, PROT, ALBUMIN in the last 168 hours. No results for input(s): LIPASE, AMYLASE in the last 168 hours. No results for input(s): AMMONIA in the last 168 hours. Coagulation Profile: No results for input(s): INR, PROTIME in the last 168 hours. Cardiac Enzymes: No results for input(s): CKTOTAL, CKMB, CKMBINDEX, TROPONINI in the last 168 hours. BNP (last 3 results) No results for input(s): PROBNP in the last 8760 hours. HbA1C: No results for input(s): HGBA1C in the last 72 hours. CBG: No results for input(s): GLUCAP in the last 168 hours. Lipid Profile: No results for input(s): CHOL, HDL, LDLCALC, TRIG, CHOLHDL, LDLDIRECT in the last 72 hours. Thyroid Function Tests: No results for input(s): TSH, T4TOTAL, FREET4, T3FREE, THYROIDAB in the last 72 hours. Anemia Panel: No results for input(s): VITAMINB12, FOLATE, FERRITIN, TIBC, IRON, RETICCTPCT in the last 72 hours. Sepsis Labs: No results for input(s): PROCALCITON, LATICACIDVEN in the last 168 hours.  No  results found for this or any previous visit (from the past 240 hour(s)).   Radiology Studies: No results found.  Scheduled Meds: . apixaban  5 mg Oral BID  . ciprofloxacin  500 mg Oral Q breakfast  . epoetin (EPOGEN/PROCRIT) injection  20,000 Units Subcutaneous Weekly  . iron polysaccharides  150 mg Oral Daily  . LORazepam  1 mg Intramuscular Once  . mouth rinse  15 mL Mouth Rinse BID  . melatonin  2.5 mg Oral QHS  . midodrine  10 mg Oral TID WC  . multivitamin  1 tablet Oral QHS  . pantoprazole  40 mg Oral BID  . polyethylene glycol  17 g Oral Daily  . torsemide  40 mg Oral Daily  . traZODone  100 mg  Oral QHS   Continuous Infusions:   LOS: 49 days   Marylu Lund, MD [facility] Pager On Amion  If 7PM-7AM, please contact night-coverage 12/07/2019, 1:21 PM

## 2019-12-08 NOTE — TOC Progression Note (Signed)
Transition of Care [facility]) - Progression Note    Patient Details  Name: Katelyn Lane MRN: [id number] Date of Birth: August 15, 1955  Transition of Care [facility]) CM/SW [place], [place] Phone Number: 12/08/2019, 1:25 PM  Clinical Narrative:    Due to complexity of this case, CSW has exhausted all known safe discharge possibilities. CSW has shared this information with Dept. Supervisor, Nathaniel Man and requested placement assistance. CSW has also spoken with DSS supervisor Kizzie Bane concerning guardianship for this patient and placement assistance.  No updates available at this time        Expected Discharge Plan and Services                                                 Social Determinants of Health (SDOH) Interventions    Readmission Risk Interventions Readmission Risk Prevention Plan 09/02/2019 08/14/2019 07/27/2019  Transportation Screening Complete Complete Complete  Medication Review [employer]) Complete Referral to Pharmacy -  PCP or Specialist appointment within 3-5 days of discharge Complete Complete Complete  HRI or Home Care Consult - Complete Complete  SW Recovery Care/Counseling Consult - Complete Complete  Palliative Care Screening Not Applicable Not Applicable Not Applicable  Skilled Nursing Facility Complete Not Applicable Not Applicable  Some recent data might be hidden

## 2019-12-08 NOTE — Progress Notes (Signed)
Nutrition Follow-up  DOCUMENTATION CODES:   Obesity unspecified  INTERVENTION:  Patient continues to decline all nutrition interventions.  Continue Rena-vit po QHS.  NUTRITION DIAGNOSIS:   Increased nutrient needs related to chronic illness (cirrhosis, CHF) as evidenced by estimated needs.  Ongoing.  GOAL:   Patient will meet greater than or equal to 90% of their needs  Progressing.   MONITOR:   PO intake, Supplement acceptance, Labs, Weight trends, Skin, I & O's  REASON FOR ASSESSMENT:   LOS    ASSESSMENT:   64 y.o. 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  Met with patient at bedside. She reports her appetite remains good and she is eating well at meals. She reports she is eating 100% of meals. Discussed increased calorie/protein requirements. Patient continues to decline oral nutrition supplements and other interventions offered by RD. Today is patient's birthday. RD wished patient a happy birthday and asked if she would like a piece of cake. Patient reports she was already brought cake but she does not want any of it at this time.  Medications reviewed and include: Cipro, Epogen 20000 units weekly, Rena-vi QHS, Protonix, Miralax, torsemide.   Labs reviewed.  Weights continue to fluctuate significantly during admission. Patient currently documented to be 105 kg (231.48 lbs).  Diet Order:   Diet Order            Diet Heart Room service appropriate? Yes; Fluid consistency: Thin; Fluid restriction: 1500 mL Fluid  Diet effective now                EDUCATION NEEDS:   Education needs have been addressed  Skin:  Skin Assessment: Reviewed RN Assessment (non-pressure injury L leg)  Last BM:  12/08/2019 - medium type  4  Height:   Ht Readings from Last 1 Encounters:  11/13/19 $RemoveB'5\' 6"'[id number]$  (1.676 m)   Weight:   Wt Readings from Last 1 Encounters:  12/05/19 105 kg   Ideal Body Weight:  59 kg  BMI:  Body mass index is 37.36 kg/m.  Estimated Nutritional Needs:   Kcal:  1900-2200kcal/day  Protein:  95-110g/day  Fluid:  1.8-2L/day  Jacklynn Barnacle, MS, RD, LDN Pager number available on Amion

## 2019-12-08 NOTE — Progress Notes (Signed)
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  After admission to the hospital, she was diagnosed with acute on chronic combinedsystolic and diastolic congestive heart failurewith ejection fraction 25 to 30%. She was not responding to IV Lasix. She was seen by nephrology, temporary dialysis catheter was performed and she was dialyzed on 10/17 and 10/18.Patient also complaining of abdominal pain, she had a paracentesis, study showed possible spontaneous peritonitis, she was treated with Rocephin and Flagyl.  Patient had a worsening respiratory status on 10/17, was diagnosed with aspiration pneumonia. She was treated with Rocephin, Zithromax and Flagyl. Patient also had abdominal pain and a CT scan suspect ischemia, but seen by general surgery on 10/19, no need for surgery.  Patient was evaluated by psychiatry and she does not has decision-making capacity. Family does not want to participate in making decisions for her. DSS case was filed for guardianship and still pending. Difficult disposition due to unsafe discharge.  Assessment & Plan:   Principal Problem:   Spontaneous bacterial peritonitis ([place]) Active Problems:   AKI (acute kidney injury) ([place])   Crack cocaine use   Acute respiratory failure with hypoxia (HCC)   LV (left ventricular) mural thrombus   Chronic kidney disease (CKD) stage G3b/A1, moderately decreased glomerular filtration rate (GFR) between 30-44 mL/min/1.73 square meter and albuminuria creatinine ratio less than 30 mg/g  (HCC)   Nonsustained ventricular tachycardia (HCC)   Acute CHF (congestive heart failure) (HCC)   Cocaine abuse ([place])   Peripheral edema   Cirrhosis of liver (HCC)   Dementia (HCC)   Aspiration pneumonia of both lower lobes due to gastric secretions (HCC)   Acute diastolic (congestive) heart failure ([place])   Cardiorenal disease   Goals of care, counseling/discussion   Palliative care by specialist   Ascites due to alcoholic cirrhosis ([place])   Spontaneous bacterial peritonitis: Resolved.  Repeat paracentesis showed significant decrease of white cell in ascites, antibiotics effective. However she still has a lot of neutrophils in 11/03/2019 fluid. Culture has been negative.Pt received many days ofRocephin, sincediscontinued.  -Pt is continued on cipro for prophylaxis. Remains stable this AM  Acute on chronic combined systolic and diastolic congestive heart failure. Acute hypoxemic respiratory failure. -Echo from 06/22/2019 showed ejection fraction of 25 to 30% with grade 3 diastolic dysfunction. She was dialyzed x2, but now getting diuresed. Pt reported does not want to get dialysis again. - repeat CXR with improved edema 11/7 -nephrology c/s, appreciate recs was started on- torsemide 40 mg daily -Continues on minimal O2 support  Liver cirrhosis with ascites:Noted. GI consulted previously in hospitalization (see prior notes). -Continue cipro for SBP prophylaxis  Dependent edema: Improving. -Patient has lower abdominal, lower back wall & leg edema-multifactorial in the setting of cirrhosis, CHF, renal failure & debility -Creatinine/GFR: Stable.  No respiratory symptoms.  Patient seems comfortable. -Continue with torsemide.  Hypotension: Remained stable.  Continue midodrine 10 mg TID.    Acute renal failure on chronic kidney disease stage IIIb: baseline creatinine appears to be 1.66. Creatinine peaked at 4.16, currently stable with mild improvement. -No plan for further  dialysis per renal. Pt reportedly does  not want dialysis again -renal transitioned to torsemide 40 mg-continue same. 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 However, there is no GI bleed observed while in the hospital. It is not safe to continue hold anticoagulation. Anticoagulation has now been restarted with eliquis.  -Monitor hemoglobin. -Continue Eliquis as tolerated  Overgrown toenails: will need outpatient podiatry follow up  Anemia of chronic disease: Hemoglobin improved to 8.6 today. -In the setting of CKD.  H&H remains stable around 7-8. -cont iron supplement -No indication of transfusion at this time. -EPO was started by nephrology.  Chronic thrombocytosis:  -Stable at this time, suspect reactive  Hypokalemia:Resolved.  GERD: Continue PPI   Depression/insomnia: Continue trazodone  Vaginal Itching: Resolved.  Chronic constipation: Cont. MiraLAX.  Morbid obesity. Body mass index is 37.36 kg/m today  Decision Making Capacity -Decision making capacity evaluated. No barriers to communication is present. The patient understands the overall medical situation However, the patient is unable to demonstrate insight to reasoning or rational for procedures recommended, such as dialysis. The patient did not demonstrate awareness of benefits or risks to treatment as well as lack of treatment. -Discussed with TOC, plan to re-eval competency. Greatly appreciate Psychiatry assistance   DVT prophylaxis: Eliquis Code Status: DNR Family Communication: Pt in room, family not at bedside  Status is: Inpatient  Remains inpatient appropriate because:Unsafe d/c plan   Dispo: The patient is from: Home              Anticipated d/c is to: SNF              Anticipated d/c date is: > 3 days              Patient  currently is medically stable to d/c. Pending dispo planning   Consultants:  Palliative Nephrology GI Psychiatry  Procedures:  10/20 perma cath insertion perma cath removal 11/5  Antimicrobials: Anti-infectives (From admission, onward)   Start     Dose/Rate Route Frequency Ordered Stop   11/14/19 0800  ciprofloxacin (CIPRO) tablet 500 mg        500 mg Oral Daily with breakfast 11/13/19 1938     11/01/19 1000  cefTRIAXone (ROCEPHIN) 2 g in sodium chloride 0.9 % 100 mL IVPB  Status:  Discontinued        2 g 200 mL/hr over 30 Minutes Intravenous Daily 10/31/19 1521 11/09/19 1425   10/27/19 1000  cefTRIAXone (ROCEPHIN) 2 g in sodium chloride 0.9 % 100 mL IVPB  Status:  Discontinued        2 g 200 mL/hr over 30 Minutes Intravenous Daily 10/26/19 1559 10/31/19 1521   10/26/19 2000  cefTRIAXone (ROCEPHIN) 1 g in sodium chloride 0.9 % 100 mL IVPB        1 g 200 mL/hr over 30 Minutes Intravenous  Once 10/26/19 1559 10/26/19 2024   10/26/19 1600  cefTRIAXone (ROCEPHIN) 2 g in sodium chloride 0.9 % 100 mL IVPB  Status:  Discontinued        2 g 200 mL/hr over 30 Minutes Intravenous Every 24 hours 10/26/19 1351 10/26/19 1600   10/26/19 1000  metroNIDAZOLE (FLAGYL) IVPB 500 mg  Status:  Discontinued        500 mg 100 mL/hr over 60 Minutes Intravenous Every 8 hours 10/26/19 0909 10/29/19 1138   10/25/19 1445  azithromycin (ZITHROMAX) 500 mg in sodium chloride 0.9 %  250 mL IVPB  Status:  Discontinued        500 mg 250 mL/hr over 60 Minutes Intravenous Every 24 hours 10/25/19 1353 10/29/19 1138   10/25/19 1230  azithromycin (ZITHROMAX) tablet 500 mg  Status:  Discontinued        500 mg Oral Daily 10/25/19 1133 10/25/19 1134   10/25/19 1030  cefTRIAXone (ROCEPHIN) 1 g in sodium chloride 0.9 % 100 mL IVPB  Status:  Discontinued        1 g 200 mL/hr over 30 Minutes Intravenous Every 12 hours 10/25/19 0931 10/26/19 1351      Subjective: Without issues  Objective: Vitals:   12/07/19 2100  12/08/19 0332 12/08/19 0743 12/08/19 1109  BP:  (!) 116/102 115/79 100/73  Pulse:  (!) 56 75 66  Resp:  17 18 18   Temp:  98 F (36.7 C) 98 F (36.7 C) 98.5 F (36.9 C)  TempSrc:   Oral Oral  SpO2: 100% 100% 100% 98%  Weight:      Height:        Intake/Output Summary (Last 24 hours) at 12/08/2019 1316 Last data filed at 12/08/2019 1007 Gross per 24 hour  Intake 360 ml  Output --  Net 360 ml   Filed Weights   12/01/19 0452 12/04/19 0500 12/05/19 0529  Weight: 106.7 kg 105.9 kg 105 kg    Examination: General exam: Awake, sitting at edge of bed in nad Respiratory system: Normal respiratory effort, no wheezing  Data Reviewed: I have personally reviewed following labs and imaging studies  CBC: Recent Labs  Lab 12/03/19 0430 12/07/19 0516  WBC 6.6 5.0  HGB 9.0* 9.0*  HCT 29.7* 30.8*  MCV 73.9* 77.4*  PLT 375 656   Basic Metabolic Panel: Recent Labs  Lab 12/03/19 0430 12/07/19 0516  CREATININE 3.28* 2.81*   GFR: Estimated Creatinine Clearance: 24.8 mL/min (A) (by C-G formula based on SCr of 2.81 mg/dL (H)). Liver Function Tests: No results for input(s): AST, ALT, ALKPHOS, BILITOT, PROT, ALBUMIN in the last 168 hours. No results for input(s): LIPASE, AMYLASE in the last 168 hours. No results for input(s): AMMONIA in the last 168 hours. Coagulation Profile: No results for input(s): INR, PROTIME in the last 168 hours. Cardiac Enzymes: No results for input(s): CKTOTAL, CKMB, CKMBINDEX, TROPONINI in the last 168 hours. BNP (last 3 results) No results for input(s): PROBNP in the last 8760 hours. HbA1C: No results for input(s): HGBA1C in the last 72 hours. CBG: No results for input(s): GLUCAP in the last 168 hours. Lipid Profile: No results for input(s): CHOL, HDL, LDLCALC, TRIG, CHOLHDL, LDLDIRECT in the last 72 hours. Thyroid Function Tests: No results for input(s): TSH, T4TOTAL, FREET4, T3FREE, THYROIDAB in the last 72 hours. Anemia Panel: No results for  input(s): VITAMINB12, FOLATE, FERRITIN, TIBC, IRON, RETICCTPCT in the last 72 hours. Sepsis Labs: No results for input(s): PROCALCITON, LATICACIDVEN in the last 168 hours.  No results found for this or any previous visit (from the past 240 hour(s)).   Radiology Studies: No results found.  Scheduled Meds: . apixaban  5 mg Oral BID  . ciprofloxacin  500 mg Oral Q breakfast  . epoetin (EPOGEN/PROCRIT) injection  20,000 Units Subcutaneous Weekly  . iron polysaccharides  150 mg Oral Daily  . LORazepam  1 mg Intramuscular Once  . mouth rinse  15 mL Mouth Rinse BID  . melatonin  2.5 mg Oral QHS  . midodrine  10 mg Oral TID WC  . multivitamin  1 tablet Oral QHS  . pantoprazole  40 mg Oral BID  . polyethylene glycol  17 g Oral Daily  . torsemide  40 mg Oral Daily  . traZODone  100 mg Oral QHS   Continuous Infusions:   LOS: 50 days   Marylu Lund, MD [facility] Pager On Amion  If 7PM-7AM, please contact night-coverage 12/08/2019, 1:16 PM

## 2019-12-08 NOTE — Consult Note (Signed)
[facility] Consult   Reason for Consult:   Follow-up consult for this 64 year old woman with a history of multiple medical problems dementia substance abuse.  Concerned about reassessing capacity Referring Physician:  Wyline Copas Patient Identification: Katelyn Lane MRN:  [id number] Principal Diagnosis: Spontaneous bacterial peritonitis [facility]) Diagnosis:  Principal Problem:   Spontaneous bacterial peritonitis ([place]) Active Problems:   AKI (acute kidney injury) ([place])   Crack cocaine use   Acute respiratory failure with hypoxia (HCC)   LV (left ventricular) mural thrombus   Chronic kidney disease (CKD) stage G3b/A1, moderately decreased glomerular filtration rate (GFR) between 30-44 mL/min/1.73 square meter and albuminuria creatinine ratio less than 30 mg/g (HCC)   Nonsustained ventricular tachycardia (HCC)   Acute CHF (congestive heart failure) (HCC)   Cocaine abuse ([place])   Peripheral edema   Cirrhosis of liver (HCC)   Dementia (HCC)   Aspiration pneumonia of both lower lobes due to gastric secretions (HCC)   Acute diastolic (congestive) heart failure (HCC)   Cardiorenal disease   Goals of care, counseling/discussion   Palliative care by specialist   Ascites due to alcoholic cirrhosis ([place])   Total Time spent with patient: 1 hour  Subjective:   Katelyn Lane is a 64 y.o. female patient admitted with "I am doing well".  HPI: Patient seen chart reviewed.  Patient well-known from previous evaluations.  Treatment team requested patient be reevaluated for "capacity".  Found the patient today in her room awake alert oriented and cooperative.  She remembered me and greeted me appropriately immediately.  I told her that I wanted to discuss her discharge planning.  I asked her if she could outline for me a plan for getting discharged from the hospital.  She proceeded to inform me about how much she wanted to get out of the hospital, to reassure me that she "could" take care of her  self if she needed to but what she could not do is tell me where exactly she would stay or describe any mechanism for paying for it or traveling there.  I even suggested whether any of the friends who called her on the telephone could be of any help.  I tried to coach her to think through how she could access her money but at every point she just could not logically follow through on a plan.  She has been here for many weeks and there is no evidence that she has really done much of anything to arrange for her own disposition.  She does not seem to be all that clear and her understanding of her finances either.  She does have some awareness that someone called a "guardian Education officer, museum" has spoken with her but does not really seem to understand the implication for this.  She tends to go off on tangents very easily, get people's roles mixed up, talk about her aspirations rather than any actual plan.  Otherwise she is not reporting any depression does not seem to be psychotic certainly does not seem to be depressed and neither does she seem to be manic.  Overall she appears to be in pretty much the same state of mind she was the last time I spoke with her.  Past Psychiatric History: Patient has a history of substance abuse problems mild dementia more recently multiple medical problems poor self-care  Risk to Self:   Risk to Others:   Prior Inpatient Therapy:   Prior Outpatient Therapy:    Past Medical History:  Past Medical History:  Diagnosis Date  . CHF (congestive heart failure) ([place])   . COVID-19   . Hyperlipidemia   . Hypertension   . Renal disorder     Past Surgical History:  Procedure Laterality Date  . DIALYSIS/PERMA CATHETER INSERTION N/A 10/28/2019   Procedure: DIALYSIS/PERMA CATHETER INSERTION;  Surgeon: Katha Cabal, MD;  Location: [facility];  Service: Cardiovascular;  Laterality: N/A;  . DIALYSIS/PERMA CATHETER REMOVAL N/A 11/13/2019   Procedure: DIALYSIS/PERMA  CATHETER REMOVAL;  Surgeon: Algernon Huxley, MD;  Location: [facility];  Service: Cardiovascular;  Laterality: N/A;  . RIGHT/LEFT HEART CATH AND CORONARY ANGIOGRAPHY N/A 12/30/2018   Procedure: RIGHT/LEFT HEART CATH AND CORONARY ANGIOGRAPHY;  Surgeon: Corey Skains, MD;  Location: [facility];  Service: Cardiovascular;  Laterality: N/A;   Family History:  Family History  Problem Relation Age of Onset  . Breast cancer Neg Hx    Family Psychiatric  History: See previous Social History:  Social History   Substance and Sexual Activity  Alcohol Use No  . Alcohol/week: 0.0 standard drinks     Social History   Substance and Sexual Activity  Drug Use Yes  . Types: Cocaine   Comment: 3-4 days ago    Social History   Socioeconomic History  . Marital status: Single    Spouse name: Not on file  . Number of children: Not on file  . Years of education: Not on file  . Highest education level: Not on file  Occupational History  . Not on file  Tobacco Use  . Smoking status: Current Every Day Smoker  . Smokeless tobacco: Never Used  Substance and Sexual Activity  . Alcohol use: No    Alcohol/week: 0.0 standard drinks  . Drug use: Yes    Types: Cocaine    Comment: 3-4 days ago  . Sexual activity: Not on file  Other Topics Concern  . Not on file  Social History Narrative  . Not on file   Social Determinants of Health   Financial Resource Strain:   . Difficulty of Paying Living Expenses: Not on file  Food Insecurity:   . Worried About Charity fundraiser in the Last Year: Not on file  . Ran Out of Food in the Last Year: Not on file  Transportation Needs:   . Lack of Transportation (Medical): Not on file  . Lack of Transportation (Non-Medical): Not on file  Physical Activity:   . Days of Exercise per Week: Not on file  . Minutes of Exercise per Session: Not on file  Stress:   . Feeling of Stress : Not on file  Social Connections:   . Frequency of  Communication with Friends and Family: Not on file  . Frequency of Social Gatherings with Friends and Family: Not on file  . Attends Religious Services: Not on file  . Active Member of Clubs or Organizations: Not on file  . Attends Archivist Meetings: Not on file  . Marital Status: Not on file   Additional Social History:    Allergies:   Allergies  Allergen Reactions  . Penicillins Hives, Rash and Other (See Comments)    Did it involve swelling of the face/tongue/throat, SOB, or low BP? No Did it involve sudden or severe rash/hives, skin peeling, or any reaction on the inside of your mouth or nose? Yes Did you need to seek medical attention at a hospital or doctor's office? Yes When did it last happen? >25  years If all above answers are "NO", may proceed with cephalosporin use.   . Ace Inhibitors Nausea And Vomiting    Labs:  Results for orders placed or performed during the hospital encounter of 10/19/19 (from the past 48 hour(s))  CBC     Status: Abnormal   Collection Time: 12/07/19  5:16 AM  Result Value Ref Range   WBC 5.0 4.0 - 10.5 K/uL   RBC 3.98 3.87 - 5.11 MIL/uL   Hemoglobin 9.0 (L) 12.0 - 15.0 g/dL   HCT 30.8 (L) 36 - 46 %   MCV 77.4 (L) 80.0 - 100.0 fL   MCH 22.6 (L) 26.0 - 34.0 pg   MCHC 29.2 (L) 30.0 - 36.0 g/dL   RDW 22.5 (H) 11.5 - 15.5 %   Platelets 365 150 - 400 K/uL   nRBC 0.0 0.0 - 0.2 %    Comment: Performed at [facility], [address] [place], [place] [address]  Creatinine, serum     Status: Abnormal   Collection Time: 12/07/19  5:16 AM  Result Value Ref Range   Creatinine, Ser 2.81 (H) 0.44 - 1.00 mg/dL   GFR, Estimated 18 (L) >60 mL/min    Comment: (NOTE) Calculated using the CKD-EPI Creatinine Equation (2021) Performed at [facility], [address], [place], [place] [address]     Current Facility-Administered Medications  Medication Dose Route Frequency Provider Last Rate Last Admin  .  acetaminophen (TYLENOL) tablet 500 mg  500 mg Oral Q6H PRN Elodia Florence., MD   500 mg at 12/08/19 0522  . albuterol (PROVENTIL) (2.5 MG/3ML) 0.083% nebulizer solution 2.5 mg  2.5 mg Nebulization Q4H PRN Sharen Hones, MD   2.5 mg at 11/20/19 0801  . alum & mag hydroxide-simeth (MAALOX/MYLANTA) 200-200-20 MG/5ML suspension 30 mL  30 mL Oral Q4H PRN Schnier, Dolores Lory, MD   30 mL at 10/23/19 2205   And  . lidocaine (XYLOCAINE) 2 % viscous mouth solution 15 mL  15 mL Oral Q4H PRN Schnier, Dolores Lory, MD   15 mL at 10/23/19 2205  . apixaban (ELIQUIS) tablet 5 mg  5 mg Oral BID Sharen Hones, MD   5 mg at 12/08/19 1058  . bisacodyl (DULCOLAX) suppository 10 mg  10 mg Rectal Daily PRN Schnier, Dolores Lory, MD   10 mg at 10/24/19 0459  . calcium carbonate (TUMS - dosed in mg elemental calcium) chewable tablet 200 mg of elemental calcium  1 tablet Oral TID PRN Sharen Hones, MD   200 mg of elemental calcium at 11/01/19 0949  . ciprofloxacin (CIPRO) tablet 500 mg  500 mg Oral Q breakfast Elodia Florence., MD   500 mg at 12/08/19 1057  . epoetin alfa (EPOGEN) injection 20,000 Units  20,000 Units Subcutaneous Weekly Lateef, [place], MD   20,000 Units at 12/03/19 1614  . hydrOXYzine (ATARAX/VISTARIL) tablet 50 mg  50 mg Oral Q6H PRN Jillienne Egner, Madie Reno, MD   50 mg at 11/29/19 2209  . iron polysaccharides (NIFEREX) capsule 150 mg  150 mg Oral Daily Sharen Hones, MD   150 mg at 12/07/19 1334  . loperamide (IMODIUM) capsule 2 mg  2 mg Oral PRN Sharen Hones, MD   2 mg at 11/09/19 0853  . LORazepam (ATIVAN) injection 1 mg  1 mg Intramuscular Once Lang Snow, NP      . MEDLINE mouth rinse  15 mL Mouth Rinse BID Schnier, Dolores Lory, MD   15 mL at 12/07/19  0920  . melatonin tablet 2.5 mg  2.5 mg Oral QHS Sharen Hones, MD   2.5 mg at 12/07/19 2131  . midodrine (PROAMATINE) tablet 10 mg  10 mg Oral TID WC Swayze, Ava, DO   10 mg at 12/07/19 1749  . multivitamin (RENA-VIT) tablet 1 tablet  1 tablet Oral  QHS Swayze, Ava, DO   1 tablet at 12/07/19 2132  . ondansetron (ZOFRAN) injection 4 mg  4 mg Intravenous Q6H PRN Schnier, Dolores Lory, MD      . oxyCODONE-acetaminophen (PERCOCET/ROXICET) 5-325 MG per tablet 1 tablet  1 tablet Oral Q4H PRN Sharen Hones, MD   1 tablet at 11/21/19 0813  . pantoprazole (PROTONIX) EC tablet 40 mg  40 mg Oral BID Sharen Hones, MD   40 mg at 12/08/19 1057  . polyethylene glycol (MIRALAX / GLYCOLAX) packet 17 g  17 g Oral Daily Pahwani, Rinka R, MD   17 g at 12/07/19 0920  . torsemide (DEMADEX) tablet 40 mg  40 mg Oral Daily Elodia Florence., MD   40 mg at 12/08/19 1057  . traZODone (DESYREL) tablet 100 mg  100 mg Oral QHS Aella Ronda, Madie Reno, MD   100 mg at 12/06/19 2226    Musculoskeletal: Strength & Muscle Tone: decreased Gait & Station: unsteady Patient leans: N/A  Psychiatric Specialty Exam: Physical Exam Vitals and nursing note reviewed.  Constitutional:      Appearance: She is well-developed.  HENT:     Head: Normocephalic and atraumatic.  Eyes:     Conjunctiva/sclera: Conjunctivae normal.     Pupils: Pupils are equal, round, and reactive to light.  Cardiovascular:     Heart sounds: Normal heart sounds.  Pulmonary:     Effort: Pulmonary effort is normal.    Abdominal:     Palpations: Abdomen is soft.  Musculoskeletal:        General: Normal range of motion.     Cervical back: Normal range of motion.  Skin:    General: Skin is warm and dry.  Neurological:     General: No focal deficit present.     Mental Status: She is alert.  Psychiatric:        Attention and Perception: She is inattentive.        Mood and Affect: Mood is anxious.        Speech: Speech normal.        Behavior: Behavior is agitated. Behavior is not aggressive or hyperactive.        Thought Content: Thought content is not delusional. Thought content does not include homicidal or suicidal ideation.        Cognition and Memory: Cognition is impaired.        Judgment:  Judgment is impulsive.     Review of Systems  Constitutional: Negative.   HENT: Negative.   Eyes: Negative.   Respiratory: Negative.   Cardiovascular: Negative.   Gastrointestinal: Negative.   Musculoskeletal: Negative.   Skin: Negative.   Neurological: Negative.   Psychiatric/Behavioral: The patient is nervous/anxious.     Blood pressure 105/77, pulse 70, temperature 97.8 F (36.6 C), resp. rate 18, height 5\' 6"  (1.676 m), weight 105 kg, SpO2 100 %.Body mass index is 37.36 kg/m.  General Appearance: Casual  Eye Contact:  Good  Speech:  Normal Rate  Volume:  Normal  Mood:  Euthymic  Affect:  Full Range  Thought Process:  Disorganized  Orientation:  Full (Time, Place, and Person)  Thought Content:  Illogical  and Tangential  Suicidal Thoughts:  No  Homicidal Thoughts:  No  Memory:  Immediate;   Fair Recent;   Poor Remote;   Poor  Judgement:  Impaired  Insight:  Shallow  Psychomotor Activity:  Normal  Concentration:  Concentration: Poor  Recall:  [employer] of Knowledge:  Fair  Language:  Fair  Akathisia:  No  Handed:  Right  AIMS (if indicated):     Assets:  Desire for Improvement Financial Resources/Insurance Resilience Social Support  ADL's:  Impaired  Cognition:  Impaired,  Mild  Sleep:        Treatment Plan Summary: Plan The bottom line remains that I do not believe that this woman has the capacity to make rational decisions as regards to formulating a discharge plan.  It is important to recognize that the concept of "capacity" always refers to some specific decision or activity.  Katelyn Lane has deficits in executive functioning, short-term memory, excessive impulsivity all of which make it impossible for her to actually come up with a reasonable plan to arrange for a place for herself to live, make the arrangements to pay for it, and make arrangements for her own transportation.  However, I do think that she has the capacity to consent to cooperating with other  people to make these decisions.  I still think the best outcome would probably be if some members of the treatment team could sit down with her and discuss specific living situations, get her to agree to the and walk her through what needs to be done to make those arrangements and a specific plan.  If, however, this is not possible then I suppose it will be a matter of waiting for guardianship.  As far as what kind of living situation she would need I am not certain.  I think there is a chance that she might still have the capacity for independent living if there were some degree of supervision but that remains to be seen.  She does not require any psychiatric medication or other psychiatric interventions or hospitalization.  I will continue to follow up if needed.  Disposition: No evidence of imminent risk to self or others at present.   Patient does not meet criteria for psychiatric inpatient admission. Supportive therapy provided about ongoing stressors. Discussed crisis plan, support from social network, calling 911, coming to the Emergency Department, and calling Suicide Hotline.  Alethia Berthold, MD 12/08/2019 4:52 PM

## 2019-12-09 LAB — COMPREHENSIVE METABOLIC PANEL
ALT: 9 U/L (ref 0–44)
AST: 21 U/L (ref 15–41)
Albumin: 2.5 g/dL — ABNORMAL LOW (ref 3.5–5.0)
Alkaline Phosphatase: 185 U/L — ABNORMAL HIGH (ref 38–126)
Anion gap: 14 (ref 5–15)
BUN: 59 mg/dL — ABNORMAL HIGH (ref 8–23)
CO2: 24 mmol/L (ref 22–32)
Calcium: 9 mg/dL (ref 8.9–10.3)
Chloride: 101 mmol/L (ref 98–111)
Creatinine, Ser: 2.91 mg/dL — ABNORMAL HIGH (ref 0.44–1.00)
GFR, Estimated: 17 mL/min — ABNORMAL LOW (ref >60)
Glucose, Bld: 135 mg/dL — ABNORMAL HIGH (ref 70–99)
Potassium: 3.4 mmol/L — ABNORMAL LOW (ref 3.5–5.1)
Sodium: 139 mmol/L (ref 135–145)
Total Bilirubin: 1.1 mg/dL (ref 0.3–1.2)
Total Protein: 6.5 g/dL (ref 6.5–8.1)

## 2019-12-09 NOTE — Plan of Care (Signed)
  Problem: Education: Goal: Knowledge of General Education information will improve Description: Including pain rating scale, medication(s)/side effects and non-pharmacologic comfort measures Outcome: Not Progressing   

## 2019-12-09 NOTE — Progress Notes (Signed)
Patient refuses all HS medications after several attempts by this nurse. Charge nurse notified.

## 2019-12-09 NOTE — Progress Notes (Signed)
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  After admission to the hospital, she was diagnosed with acute on chronic combinedsystolic and diastolic congestive heart failurewith ejection fraction 25 to 30%. She was not responding to IV Lasix. She was seen by nephrology, temporary dialysis catheter was performed and she was dialyzed on 10/17 and 10/18.Patient also complaining of abdominal pain, she had a paracentesis, study showed possible spontaneous peritonitis, she was treated with Rocephin and Flagyl.  Patient had a worsening respiratory status on 10/17, was diagnosed with aspiration pneumonia. She was treated with Rocephin, Zithromax and Flagyl. Patient also had abdominal pain and a CT scan suspect ischemia, but seen by general surgery on 10/19, no need for surgery.  Patient was evaluated by psychiatry and she does not has decision-making capacity. Family does not want to participate in making decisions for her. DSS case was filed for guardianship and still pending. Difficult disposition due to unsafe discharge.  Assessment & Plan:   Principal Problem:   Spontaneous bacterial peritonitis ([place]) Active Problems:   AKI (acute kidney injury) ([place])   Crack cocaine use   Acute respiratory failure with hypoxia (HCC)   LV (left ventricular) mural thrombus   Chronic kidney disease (CKD) stage G3b/A1, moderately decreased glomerular filtration rate (GFR) between 30-44 mL/min/1.73 square meter and albuminuria creatinine ratio less than 30 mg/g  (HCC)   Nonsustained ventricular tachycardia (HCC)   Acute CHF (congestive heart failure) (HCC)   Cocaine abuse ([place])   Peripheral edema   Cirrhosis of liver (HCC)   Dementia (HCC)   Aspiration pneumonia of both lower lobes due to gastric secretions (HCC)   Acute diastolic (congestive) heart failure ([place])   Cardiorenal disease   Goals of care, counseling/discussion   Palliative care by specialist   Ascites due to alcoholic cirrhosis ([place])   Spontaneous bacterial peritonitis: Resolved.  Repeat paracentesis showed significant decrease of white cell in ascites, antibiotics effective. However she still has a lot of neutrophils in 11/03/2019 fluid. Culture has been negative.Pt received many days ofRocephin, sincediscontinued.  -Pt is continued on cipro for prophylaxis. Remains stable this AM  Acute on chronic combined systolic and diastolic congestive heart failure. Acute hypoxemic respiratory failure. -Echo from 06/22/2019 showed ejection fraction of 25 to 30% with grade 3 diastolic dysfunction. She was dialyzed x2, but now getting diuresed. Pt reported does not want to get dialysis again. -nephrology started on- torsemide 40 mg daily -Continues on minimal O2 support  Liver cirrhosis with ascites:Noted. GI consulted previously in hospitalization (see prior notes). -Continue cipro for SBP prophylaxis  Dependent edema: Improving. -Patient has lower abdominal, lower back wall & leg edema-multifactorial in the setting of cirrhosis, CHF, renal failure & debility -Creatinine/GFR: Stable.  No respiratory symptoms.  Patient seems comfortable. -Continue with torsemide.  Hypotension: Remained stable.  Continue midodrine 10 mg TID.    Acute renal failure on chronic kidney disease stage IIIb: baseline creatinine appears to be 1.66. Creatinine peaked at 4.16, currently stable with mild improvement. -No plan for further dialysis per renal. Pt reportedly does not want dialysis  again -renal transitioned to torsemide 40 mg-continue same. -  Periodic checking of renal function  Hyponatremia:  -Resolved  Left ventricular thrombus with history of CVA.Patient was initially concerned for possible GI bleed, anticoagulation was on hold. However, there is no GI bleed observed while in the hospital. It is not safe to continue hold anticoagulation. Anticoagulation has now been restarted with eliquis.  -Monitor hemoglobin. -Continue Eliquis as tolerated  Overgrown toenails: will need outpatient podiatry follow up  Anemia of chronic disease: Hemoglobin stable -In the setting of CKD.  H&H remains stable around 7-8. -cont iron supplement -No indication of transfusion at this time. -EPO per nephrology.  Chronic thrombocytosis:  -Stable at this time, suspect reactive  Hypokalemia:Resolved.  GERD: Continue PPI   Depression/insomnia: Continue trazodone  Vaginal Itching: Resolved.  Chronic constipation: Cont. MiraLAX.  Morbid obesity. Body mass index is 37.36 kg/m today  Decision Making Capacity -Decision making capacity evaluated. No barriers to communication is present. The patient understands the overall medical situation However, the patient is unable to demonstrate insight to reasoning or rational for procedures recommended, such as dialysis. The patient did not demonstrate awareness of benefits or risks to treatment as well as lack of treatment. -Appreciate psychiatry evaluation on 11/30.  The do not believe she has a capacity to make rational decision regarding formulation of discharge plan.   DVT prophylaxis: Eliquis Code Status: DNR Family Communication: Pt in room, family not at bedside  Status is: Inpatient  Remains inpatient appropriate because:Unsafe d/c plan   Dispo: The patient is from: Home              Anticipated d/c is to: SNF              Anticipated d/c date is: > 3 days              Patient currently is medically stable  to d/c. Pending dispo planning.  Guardianship?  Unable to make rational decision by herself and unable to get SNF is a rate limiting step likely contributing to longer length of stay   Consultants:  Palliative Nephrology GI Psychiatry  Procedures:  10/20 perma cath insertion perma cath removal 11/5  Antimicrobials: Anti-infectives (From admission, onward)   Start     Dose/Rate Route Frequency Ordered Stop   11/14/19 0800  ciprofloxacin (CIPRO) tablet 500 mg        500 mg Oral Daily with breakfast 11/13/19 1938     11/01/19 1000  cefTRIAXone (ROCEPHIN) 2 g in sodium chloride 0.9 % 100 mL IVPB  Status:  Discontinued        2 g 200 mL/hr over 30 Minutes Intravenous Daily 10/31/19 1521 11/09/19 1425   10/27/19 1000  cefTRIAXone (ROCEPHIN) 2 g in sodium chloride 0.9 % 100 mL IVPB  Status:  Discontinued        2 g 200 mL/hr over 30 Minutes Intravenous Daily 10/26/19 1559 10/31/19 1521   10/26/19 2000  cefTRIAXone (ROCEPHIN) 1 g in sodium chloride 0.9 % 100 mL IVPB        1 g 200 mL/hr over 30 Minutes Intravenous  Once 10/26/19 1559 10/26/19 2024   10/26/19 1600  cefTRIAXone (ROCEPHIN) 2 g in sodium chloride 0.9 % 100 mL IVPB  Status:  Discontinued        2 g 200 mL/hr over 30 Minutes Intravenous Every 24 hours 10/26/19 1351 10/26/19 1600   10/26/19 1000  metroNIDAZOLE (FLAGYL) IVPB 500 mg  Status:  Discontinued        500 mg 100 mL/hr over 60  Minutes Intravenous Every 8 hours 10/26/19 0909 10/29/19 1138   10/25/19 1445  azithromycin (ZITHROMAX) 500 mg in sodium chloride 0.9 % 250 mL IVPB  Status:  Discontinued        500 mg 250 mL/hr over 60 Minutes Intravenous Every 24 hours 10/25/19 1353 10/29/19 1138   10/25/19 1230  azithromycin (ZITHROMAX) tablet 500 mg  Status:  Discontinued        500 mg Oral Daily 10/25/19 1133 10/25/19 1134   10/25/19 1030  cefTRIAXone (ROCEPHIN) 1 g in sodium chloride 0.9 % 100 mL IVPB  Status:  Discontinued        1 g 200 mL/hr over 30 Minutes Intravenous  Every 12 hours 10/25/19 0931 10/26/19 1351      Subjective: Requesting new bed, says her bed has a whole.  No other complaints sitting on the bedside commode  Objective: Vitals:   12/09/19 1110 12/09/19 1111 12/09/19 1528 12/09/19 1529  BP: (!) 131/108 (!) 135/102 (!) 111/93 (!) 111/93  Pulse: 94  72 73  Resp: 18  17 17   Temp: 98.1 F (36.7 C)  (!) 97.4 F (36.3 C) (!) 97.4 F (36.3 C)  TempSrc: Oral  Oral Oral  SpO2: 97%  100% 100%  Weight:      Height:        Intake/Output Summary (Last 24 hours) at 12/09/2019 1724 Last data filed at 12/09/2019 1300 Gross per 24 hour  Intake 360 ml  Output --  Net 360 ml   Filed Weights   12/01/19 0452 12/04/19 0500 12/05/19 0529  Weight: 106.7 kg 105.9 kg 105 kg    Examination: General exam: Awake, sitting at edge of bed in nad Respiratory system: Normal respiratory effort, no wheezing  Data Reviewed: I have personally reviewed following labs and imaging studies  CBC: Recent Labs  Lab 12/03/19 0430 12/07/19 0516  WBC 6.6 5.0  HGB 9.0* 9.0*  HCT 29.7* 30.8*  MCV 73.9* 77.4*  PLT 375 161   Basic Metabolic Panel: Recent Labs  Lab 12/03/19 0430 12/07/19 0516 12/09/19 0507  NA  --   --  139  K  --   --  3.4*  CL  --   --  101  CO2  --   --  24  GLUCOSE  --   --  135*  BUN  --   --  59*  CREATININE 3.28* 2.81* 2.91*  CALCIUM  --   --  9.0   GFR: Estimated Creatinine Clearance: 23.9 mL/min (A) (by C-G formula based on SCr of 2.91 mg/dL (H)). Liver Function Tests: Recent Labs  Lab 12/09/19 0507  AST 21  ALT 9  ALKPHOS 185*  BILITOT 1.1  PROT 6.5  ALBUMIN 2.5*   No results for input(s): LIPASE, AMYLASE in the last 168 hours. No results for input(s): AMMONIA in the last 168 hours. Coagulation Profile: No results for input(s): INR, PROTIME in the last 168 hours. Cardiac Enzymes: No results for input(s): CKTOTAL, CKMB, CKMBINDEX, TROPONINI in the last 168 hours. BNP (last 3 results) No results for input(s):  PROBNP in the last 8760 hours. HbA1C: No results for input(s): HGBA1C in the last 72 hours. CBG: No results for input(s): GLUCAP in the last 168 hours. Lipid Profile: No results for input(s): CHOL, HDL, LDLCALC, TRIG, CHOLHDL, LDLDIRECT in the last 72 hours. Thyroid Function Tests: No results for input(s): TSH, T4TOTAL, FREET4, T3FREE, THYROIDAB in the last 72 hours. Anemia Panel: No results for input(s): VITAMINB12, FOLATE, FERRITIN,  TIBC, IRON, RETICCTPCT in the last 72 hours. Sepsis Labs: No results for input(s): PROCALCITON, LATICACIDVEN in the last 168 hours.  No results found for this or any previous visit (from the past 240 hour(s)).   Radiology Studies: No results found.  Scheduled Meds: . apixaban  5 mg Oral BID  . ciprofloxacin  500 mg Oral Q breakfast  . epoetin (EPOGEN/PROCRIT) injection  20,000 Units Subcutaneous Weekly  . iron polysaccharides  150 mg Oral Daily  . LORazepam  1 mg Intramuscular Once  . mouth rinse  15 mL Mouth Rinse BID  . melatonin  2.5 mg Oral QHS  . midodrine  10 mg Oral TID WC  . multivitamin  1 tablet Oral QHS  . pantoprazole  40 mg Oral BID  . polyethylene glycol  17 g Oral Daily  . torsemide  40 mg Oral Daily  . traZODone  100 mg Oral QHS   Continuous Infusions:   LOS: 51 days   Max Sane, MD [facility] Pager On Amion  If 7PM-7AM, please contact night-coverage 12/09/2019, 5:24 PM

## 2019-12-10 DIAGNOSIS — K652 Spontaneous bacterial peritonitis: Secondary | ICD-10-CM | POA: Diagnosis not present

## 2019-12-10 DIAGNOSIS — I509 Heart failure, unspecified: Secondary | ICD-10-CM | POA: Diagnosis not present

## 2019-12-10 DIAGNOSIS — N179 Acute kidney failure, unspecified: Secondary | ICD-10-CM | POA: Diagnosis not present

## 2019-12-10 DIAGNOSIS — R609 Edema, unspecified: Secondary | ICD-10-CM | POA: Diagnosis not present

## 2019-12-10 LAB — CBC
HCT: 30 % — ABNORMAL LOW (ref 36.0–46.0)
Hemoglobin: 9 g/dL — ABNORMAL LOW (ref 12.0–15.0)
MCH: 22.9 pg — ABNORMAL LOW (ref 26.0–34.0)
MCHC: 30 g/dL (ref 30.0–36.0)
MCV: 76.3 fL — ABNORMAL LOW (ref 80.0–100.0)
Platelets: 405 10*3/uL — ABNORMAL HIGH (ref 150–400)
RBC: 3.93 MIL/uL (ref 3.87–5.11)
RDW: 22.4 % — ABNORMAL HIGH (ref 11.5–15.5)
WBC: 5.6 10*3/uL (ref 4.0–10.5)
nRBC: 0 % (ref 0.0–0.2)

## 2019-12-10 LAB — BASIC METABOLIC PANEL
Anion gap: 10 (ref 5–15)
BUN: 57 mg/dL — ABNORMAL HIGH (ref 8–23)
CO2: 27 mmol/L (ref 22–32)
Calcium: 8.9 mg/dL (ref 8.9–10.3)
Chloride: 101 mmol/L (ref 98–111)
Creatinine, Ser: 2.74 mg/dL — ABNORMAL HIGH (ref 0.44–1.00)
GFR, Estimated: 19 mL/min — ABNORMAL LOW (ref >60)
Glucose, Bld: 132 mg/dL — ABNORMAL HIGH (ref 70–99)
Potassium: 3.4 mmol/L — ABNORMAL LOW (ref 3.5–5.1)
Sodium: 138 mmol/L (ref 135–145)

## 2019-12-10 MED ORDER — POTASSIUM CHLORIDE CRYS ER 20 MEQ PO TBCR
40.0000 meq | EXTENDED_RELEASE_TABLET | Freq: Once | ORAL | Status: AC
  Administered 2019-12-10: 40 meq via ORAL
  Filled 2019-12-10: qty 2

## 2019-12-10 NOTE — Progress Notes (Signed)
   12/10/19 1120  Clinical Encounter Type  Visited With Patient  Visit Type Initial  Referral From Chaplain  Consult/Referral To Chaplain  Chaplain visited with Pt, who said she was feeling much better today than the other day. Chaplain was glad to hear that. Pt was cold and needed a blanket, chaplain got her a blanket and put it across her feet. Chaplain also wished Pt's a belated Happy Birthday.

## 2019-12-10 NOTE — Progress Notes (Signed)
Physical Therapy Treatment Patient Details Name: Katelyn Lane MRN: [id number] DOB: 04/19/1955 Today's Date: 12/10/2019    History of Present Illness Pt is a 64 yo female who presented to ER secondary to LE edema, progressive SOB; admitted for management of acute/chronic CHF, spontaneous bacterial peritonitis (s/p paracentesis 10/25).  Hospital course also significant for initiation of dialysis (initially, R temp fem cath; converted to R permcath).  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    PT Comments    Pt was long sitting in bed upon arriving. She agrees to PT session and was cooperative throughout. Pt continues to have some impulsivity at times but overall more receptive to cueing/safey concerns. She was able to exit R side of bed and get to/from [facility] without assistance. Then stood to RW and ambulated 200 ft in hallway without standing rest. Does have slight SOB noted but overall tolerated well. RN was in room at conclusion of session. Pt will benefit from 24 hour supervision at DC 2/2 to impulsivity and lack of safety insight. Acute PT will continue to follow per current POC.    Follow Up Recommendations  Home health PT;Supervision/Assistance - 24 hour     Equipment Recommendations  Rolling walker with 5" wheels;3in1 (PT)    Recommendations for Other Services       Precautions / Restrictions Precautions Precautions: Fall Restrictions Weight Bearing Restrictions: No    Mobility  Bed Mobility Overal bed mobility: Needs Assistance Bed Mobility: Supine to Sit;Sit to Supine     Supine to  sit: Supervision Sit to supine:  (was seated EOB with RN in room at conclusion of session)      Transfers Overall transfer level: Needs assistance Equipment used: Rolling walker (2 wheeled);None Transfers: Sit to/from Stand Sit to Stand: Supervision;From elevated surface            Ambulation/Gait Ambulation/Gait assistance: Min guard;Supervision Gait Distance (Feet): 200 Feet Assistive device: Rolling walker (2 wheeled)   Gait velocity: decreased   General Gait Details: Pt ambulated 200 ft with RW wuithout standing rest. does have slight SOB however overall tolerated well.       Balance Overall balance assessment: Needs assistance Sitting-balance support: Feet supported;No upper extremity supported Sitting balance-Leahy Scale: Good     Standing balance support: Bilateral upper extremity supported;During functional activity Standing balance-Leahy Scale: Good Standing balance comment: no LOB with BUE support. occasional let go of RW all together without LOB however highly recommend continued use of DME       Cognition Arousal/Alertness: Awake/alert Behavior During Therapy: Impulsive Overall Cognitive Status: Within Functional Limits for tasks assessed (impulsive behaviors)      General Comments: Pt is alert and able to follwo commands however does dictate session progression greatly             Pertinent Vitals/Pain Pain Assessment: No/denies pain           PT Goals (current goals can now be found in the care plan section) Acute Rehab PT Goals Patient Stated Goal: none stated Progress towards PT goals: Progressing toward goals    Frequency    Min 2X/week      PT Plan Current plan remains appropriate  AM-PAC PT "6 Clicks" Mobility   Outcome Measure  Help needed turning from your back to your side while in a flat bed without using bedrails?: A Little Help needed moving from lying on your back to sitting on the side of a flat bed without  using bedrails?: A Little Help needed moving to and from a bed to a chair (including a wheelchair)?: A Little Help needed standing up from a chair using your arms (e.g., wheelchair or bedside chair)?: A Little Help needed to walk in hospital room?: A Little Help needed climbing 3-5 steps with a railing? : A Little 6 Click Score: 18    End of Session Equipment Utilized During Treatment: Gait belt Activity Tolerance: Patient tolerated treatment well Patient left: in bed;with nursing/sitter in room;with call bell/phone within reach Nurse Communication: Mobility status;Other (comment) PT Visit Diagnosis: Muscle weakness (generalized) (M62.81);Difficulty in walking, not elsewhere classified (R26.2)     Time: 1530-1550 PT Time Calculation (min) (ACUTE ONLY): 20 min  Charges:  $Gait Training: 8-22 mins                     Julaine Fusi PTA 12/10/19, 6:20 PM

## 2019-12-10 NOTE — Progress Notes (Signed)
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  After admission to the hospital, she was diagnosed with acute on chronic combinedsystolic and diastolic congestive heart failurewith ejection fraction 25 to 30%. She was not responding to IV Lasix. She was seen by nephrology, temporary dialysis catheter was performed and she was dialyzed on 10/17 and 10/18.Patient also complaining of abdominal pain, she had a paracentesis, study showed possible spontaneous peritonitis, she was treated with Rocephin and Flagyl.  Patient had a worsening respiratory status on 10/17, was diagnosed with aspiration pneumonia. She was treated with Rocephin, Zithromax and Flagyl. Patient also had abdominal pain and a CT scan suspect ischemia, but seen by general surgery on 10/19, no need for surgery.  Patient was evaluated by psychiatry and she does not has decision-making capacity. Family does not want to participate in making decisions for her. DSS case was filed for guardianship and still pending. Difficult disposition due to unsafe discharge.  Assessment & Plan:   Principal Problem:   Spontaneous bacterial peritonitis ([place]) Active Problems:   AKI (acute kidney injury) ([place])   Crack cocaine use   Acute respiratory failure with hypoxia (HCC)   LV (left ventricular) mural thrombus   Chronic kidney disease (CKD) stage G3b/A1, moderately decreased glomerular filtration rate (GFR) between 30-44 mL/min/1.73 square meter and albuminuria creatinine ratio less than 30 mg/g  (HCC)   Nonsustained ventricular tachycardia (HCC)   Acute CHF (congestive heart failure) (HCC)   Cocaine abuse ([place])   Peripheral edema   Cirrhosis of liver (HCC)   Dementia (HCC)   Aspiration pneumonia of both lower lobes due to gastric secretions (HCC)   Acute diastolic (congestive) heart failure ([place])   Cardiorenal disease   Goals of care, counseling/discussion   Palliative care by specialist   Ascites due to alcoholic cirrhosis ([place])   Spontaneous bacterial peritonitis: Resolved.  Repeat paracentesis showed significant decrease of white cell in ascites, antibiotics effective. However she still has a lot of neutrophils in 11/03/2019 fluid. Culture has been negative.Pt received many days ofRocephin, sincediscontinued.  -Pt is continued on cipro for prophylaxis. Remains stable   Acute on chronic combined systolic and diastolic congestive heart failure. Acute hypoxemic respiratory failure. -Echo from 06/22/2019 showed ejection fraction of 25 to 30% with grade 3 diastolic dysfunction. She was dialyzed x2, but now getting diuresed. Pt reported does not want to get dialysis again. - continue torsemide 40 mg daily -Continues on minimal O2 support as need  Liver cirrhosis with ascites:Noted. GI consulted previously in hospitalization (see prior notes). -Continue cipro for SBP prophylaxis  Dependent edema: Improving. -Patient has lower abdominal, lower back wall & leg edema-multifactorial in the setting of cirrhosis, CHF, renal failure & debility -Creatinine/GFR: Stable.  No respiratory symptoms.  Patient seems comfortable. -Continue with torsemide.  Hypotension: Remained stable.  Continue midodrine 10 mg TID.    Acute renal failure on chronic kidney disease stage IIIb: baseline creatinine appears to be 1.66. Creatinine peaked at 4.16, currently 2.74. -No plan for further dialysis per renal. Pt reportedly does not want dialysis again -renal transitioned to torsemide  40 mg-continue same. -Periodic checking of  renal function, improving slowly  Hyponatremia:  -Resolved  Left ventricular thrombus with history of CVA.Patient was initially concerned for possible GI bleed, anticoagulation was on hold. However, there is no GI bleed observed while in the hospital. It is not safe to continue hold anticoagulation. Anticoagulation has now been restarted with eliquis.  -Monitor hemoglobin. -Continue Eliquis   Overgrown toenails: will need outpatient podiatry follow up  Anemia of chronic disease: Hemoglobin stable -In the setting of CKD.  H&H remains stable around 7-8. -cont iron supplement -No indication of transfusion at this time. -EPO per nephrology.  Chronic thrombocytosis:  -Stable at this time, suspect reactive  Hypokalemia:Resolved.  GERD: Continue PPI   Depression/insomnia: Continue trazodone  Vaginal Itching: Resolved.  Chronic constipation: Cont. MiraLAX.  Morbid obesity. Body mass index is 37.36 kg/m today  Decision Making Capacity -Decision making capacity evaluated. No barriers to communication is present. The patient understands the overall medical situation However, the patient is unable to demonstrate insight to reasoning or rational for procedures recommended, such as dialysis. The patient did not demonstrate awareness of benefits or risks to treatment as well as lack of treatment. -Appreciate psychiatry evaluation on 11/30.  The do not believe she has a capacity to make rational decision regarding formulation of discharge plan.   DVT prophylaxis: Eliquis Code Status: DNR Family Communication: Pt in room, family not at bedside  Status is: Inpatient  Remains inpatient appropriate because:Unsafe d/c plan   Dispo: The patient is from: Home              Anticipated d/c is to: SNF              Anticipated d/c date is: > 3 days              Patient currently is medically stable to d/c. Pending dispo planning.   Guardianship?  Unable to make rational decision by herself and unable to get SNF is a rate limiting step likely contributing to longer length of stay   Consultants:  Palliative Nephrology GI Psychiatry  Procedures:  10/20 perma cath insertion perma cath removal 11/5  Antimicrobials: Anti-infectives (From admission, onward)   Start     Dose/Rate Route Frequency Ordered Stop   11/14/19 0800  ciprofloxacin (CIPRO) tablet 500 mg        500 mg Oral Daily with breakfast 11/13/19 1938     11/01/19 1000  cefTRIAXone (ROCEPHIN) 2 g in sodium chloride 0.9 % 100 mL IVPB  Status:  Discontinued        2 g 200 mL/hr over 30 Minutes Intravenous Daily 10/31/19 1521 11/09/19 1425   10/27/19 1000  cefTRIAXone (ROCEPHIN) 2 g in sodium chloride 0.9 % 100 mL IVPB  Status:  Discontinued        2 g 200 mL/hr over 30 Minutes Intravenous Daily 10/26/19 1559 10/31/19 1521   10/26/19 2000  cefTRIAXone (ROCEPHIN) 1 g in sodium chloride 0.9 % 100 mL IVPB        1 g 200 mL/hr over 30 Minutes Intravenous  Once 10/26/19 1559 10/26/19 2024   10/26/19 1600  cefTRIAXone (ROCEPHIN) 2 g in sodium chloride 0.9 % 100 mL IVPB  Status:  Discontinued        2 g 200 mL/hr over 30 Minutes Intravenous Every 24 hours 10/26/19 1351 10/26/19 1600   10/26/19 1000  metroNIDAZOLE (FLAGYL) IVPB 500 mg  Status:  Discontinued        500 mg 100 mL/hr over 60 Minutes Intravenous  Every 8 hours 10/26/19 0909 10/29/19 1138   10/25/19 1445  azithromycin (ZITHROMAX) 500 mg in sodium chloride 0.9 % 250 mL IVPB  Status:  Discontinued        500 mg 250 mL/hr over 60 Minutes Intravenous Every 24 hours 10/25/19 1353 10/29/19 1138   10/25/19 1230  azithromycin (ZITHROMAX) tablet 500 mg  Status:  Discontinued        500 mg Oral Daily 10/25/19 1133 10/25/19 1134   10/25/19 1030  cefTRIAXone (ROCEPHIN) 1 g in sodium chloride 0.9 % 100 mL IVPB  Status:  Discontinued        1 g 200 mL/hr over 30 Minutes Intravenous Every 12 hours 10/25/19 0931  10/26/19 1351      Subjective: Still Requesting new bed, no other complaints  Objective: Vitals:   12/10/19 0323 12/10/19 0728 12/10/19 1112 12/10/19 1604  BP: 101/82 116/78 115/87 102/76  Pulse: 80 71 79 74  Resp: 18 17 16 17   Temp: 97.9 F (36.6 C) 98.4 F (36.9 C) 98.3 F (36.8 C) 97.6 F (36.4 C)  TempSrc:   Oral Oral  SpO2: 100% 100% 97% 100%  Weight:      Height:        Intake/Output Summary (Last 24 hours) at 12/10/2019 1653 Last data filed at 12/10/2019 1020 Gross per 24 hour  Intake 360 ml  Output --  Net 360 ml   Filed Weights   12/01/19 0452 12/04/19 0500 12/05/19 0529  Weight: 106.7 kg 105.9 kg 105 kg    Examination: General exam: Awake, sitting at edge of bed in nad Respiratory system: Normal respiratory effort, no wheezing  Data Reviewed: I have personally reviewed following labs and imaging studies  CBC: Recent Labs  Lab 12/07/19 0516 12/10/19 0435  WBC 5.0 5.6  HGB 9.0* 9.0*  HCT 30.8* 30.0*  MCV 77.4* 76.3*  PLT 365 546*   Basic Metabolic Panel: Recent Labs  Lab 12/07/19 0516 12/09/19 0507 12/10/19 0435  NA  --  139 138  K  --  3.4* 3.4*  CL  --  101 101  CO2  --  24 27  GLUCOSE  --  135* 132*  BUN  --  59* 57*  CREATININE 2.81* 2.91* 2.74*  CALCIUM  --  9.0 8.9   GFR: Estimated Creatinine Clearance: 25.4 mL/min (A) (by C-G formula based on SCr of 2.74 mg/dL (H)). Liver Function Tests: Recent Labs  Lab 12/09/19 0507  AST 21  ALT 9  ALKPHOS 185*  BILITOT 1.1  PROT 6.5  ALBUMIN 2.5*   No results for input(s): LIPASE, AMYLASE in the last 168 hours. No results for input(s): AMMONIA in the last 168 hours. Coagulation Profile: No results for input(s): INR, PROTIME in the last 168 hours. Cardiac Enzymes: No results for input(s): CKTOTAL, CKMB, CKMBINDEX, TROPONINI in the last 168 hours. BNP (last 3 results) No results for input(s): PROBNP in the last 8760 hours. HbA1C: No results for input(s): HGBA1C in the last 72  hours. CBG: No results for input(s): GLUCAP in the last 168 hours. Lipid Profile: No results for input(s): CHOL, HDL, LDLCALC, TRIG, CHOLHDL, LDLDIRECT in the last 72 hours. Thyroid Function Tests: No results for input(s): TSH, T4TOTAL, FREET4, T3FREE, THYROIDAB in the last 72 hours. Anemia Panel: No results for input(s): VITAMINB12, FOLATE, FERRITIN, TIBC, IRON, RETICCTPCT in the last 72 hours. Sepsis Labs: No results for input(s): PROCALCITON, LATICACIDVEN in the last 168 hours.  No results found for this or any previous  visit (from the past 240 hour(s)).   Radiology Studies: No results found.  Scheduled Meds: . apixaban  5 mg Oral BID  . ciprofloxacin  500 mg Oral Q breakfast  . epoetin (EPOGEN/PROCRIT) injection  20,000 Units Subcutaneous Weekly  . iron polysaccharides  150 mg Oral Daily  . LORazepam  1 mg Intramuscular Once  . mouth rinse  15 mL Mouth Rinse BID  . melatonin  2.5 mg Oral QHS  . midodrine  10 mg Oral TID WC  . multivitamin  1 tablet Oral QHS  . pantoprazole  40 mg Oral BID  . polyethylene glycol  17 g Oral Daily  . torsemide  40 mg Oral Daily  . traZODone  100 mg Oral QHS   Continuous Infusions:   LOS: 52 days   Max Sane, MD [facility] Pager On Amion  If 7PM-7AM, please contact night-coverage 12/10/2019, 4:53 PM

## 2019-12-11 NOTE — Progress Notes (Signed)
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  After admission to the hospital, she was diagnosed with acute on chronic combinedsystolic and diastolic congestive heart failurewith ejection fraction 25 to 30%. She was not responding to IV Lasix. She was seen by nephrology, temporary dialysis catheter was performed and she was dialyzed on 10/17 and 10/18.Patient also complaining of abdominal pain, she had a paracentesis, study showed possible spontaneous peritonitis, she was treated with Rocephin and Flagyl.  Patient had a worsening respiratory status on 10/17, was diagnosed with aspiration pneumonia. She was treated with Rocephin, Zithromax and Flagyl. Patient also had abdominal pain and a CT scan suspect ischemia, but seen by general surgery on 10/19, no need for surgery.  Patient was evaluated by psychiatry and she does not has decision-making capacity. Family does not want to participate in making decisions for her. DSS case was filed for guardianship and still pending. Difficult disposition due to unsafe discharge.  Assessment & Plan:   Principal Problem:   Spontaneous bacterial peritonitis ([place]) Active Problems:   AKI (acute kidney injury) ([place])   Crack cocaine use   Acute respiratory failure with hypoxia (HCC)   LV (left ventricular) mural thrombus   Chronic kidney disease (CKD) stage G3b/A1, moderately decreased glomerular filtration rate (GFR) between 30-44 mL/min/1.73 square meter and albuminuria creatinine ratio less than 30 mg/g  (HCC)   Nonsustained ventricular tachycardia (HCC)   Acute CHF (congestive heart failure) (HCC)   Cocaine abuse ([place])   Peripheral edema   Cirrhosis of liver (HCC)   Dementia (HCC)   Aspiration pneumonia of both lower lobes due to gastric secretions (HCC)   Acute diastolic (congestive) heart failure ([place])   Cardiorenal disease   Goals of care, counseling/discussion   Palliative care by specialist   Ascites due to alcoholic cirrhosis ([place])   Spontaneous bacterial peritonitis: Resolved.  Repeat paracentesis showed significant decrease of white cell in ascites, antibiotics effective. However she still has a lot of neutrophils in 11/03/2019 fluid. Culture has been negative.Pt received many days ofRocephin, sincediscontinued.  -Pt is continued on cipro for prophylaxis. Remains stable   Acute on chronic combined systolic and diastolic congestive heart failure. Acute hypoxemic respiratory failure. -Echo from 06/22/2019 showed ejection fraction of 25 to 30% with grade 3 diastolic dysfunction. She was dialyzed x2, but now getting diuresed. Pt reported does not want to get dialysis again. - continue torsemide 40 mg daily -Continues on minimal O2 support as need  Liver cirrhosis with ascites:Noted. GI consulted previously in hospitalization (see prior notes). -Continue cipro for SBP prophylaxis  Dependent edema: Improving. -Patient has lower abdominal, lower back wall & leg edema-multifactorial in the setting of cirrhosis, CHF, renal failure & debility -Creatinine/GFR: Stable.  No respiratory symptoms.  Patient seems comfortable. -Continue with torsemide.  Hypotension: Remained stable.  Continue midodrine 10 mg TID.    Acute renal failure on chronic kidney disease stage IIIb: baseline creatinine appears to be 1.66. Creatinine peaked at 4.16, currently 2.74. -No plan for further dialysis per renal. Pt reportedly does not want dialysis again -renal transitioned to torsemide  40 mg-continue same. -Periodic checking of  renal function, improving slowly  Hyponatremia:  -Resolved  Left ventricular thrombus with history of CVA.Patient was initially concerned for possible GI bleed, anticoagulation was on hold. However, there is no GI bleed observed while in the hospital. It is not safe to continue hold anticoagulation. Anticoagulation has now been restarted with eliquis.  -Monitor hemoglobin. -Continue Eliquis   Overgrown toenails: will need outpatient podiatry follow up  Anemia of chronic disease: Hemoglobin stable -In the setting of CKD.  H&H remains stable around 7-8. -cont iron supplement -No indication of transfusion at this time. -EPO per nephrology.  Chronic thrombocytosis:  -Stable at this time, suspect reactive  Hypokalemia:Resolved.  GERD: Continue PPI   Depression/insomnia: Continue trazodone  Vaginal Itching: Resolved.  Chronic constipation: Cont. MiraLAX.  Morbid obesity. Body mass index is 37.36 kg/m today  Decision Making Capacity -Decision making capacity evaluated. No barriers to communication is present. The patient understands the overall medical situation However, the patient is unable to demonstrate insight to reasoning or rational for procedures recommended, such as dialysis. The patient did not demonstrate awareness of benefits or risks to treatment as well as lack of treatment. -Appreciate psychiatry evaluation on 11/30.  The do not believe she has a capacity to make rational decision regarding formulation of discharge plan.   DVT prophylaxis: Eliquis Code Status: DNR Family Communication: Pt in room, family not at bedside  Status is: Inpatient  Remains inpatient appropriate because:Unsafe d/c plan   Dispo: The patient is from: Home              Anticipated d/c is to: SNF              Anticipated d/c date is: > 3 days              Patient currently is medically stable to d/c. Pending dispo planning.   Guardianship in process?  Unable to make rational decision by herself and unable to get SNF is a rate limiting step likely contributing to longer length of stay.  Discharge is dependent on TOC able to find a safe place   Consultants:  Palliative Nephrology GI Psychiatry  Procedures:  10/20 perma cath insertion perma cath removal 11/5  Antimicrobials: Anti-infectives (From admission, onward)   Start     Dose/Rate Route Frequency Ordered Stop   11/14/19 0800  ciprofloxacin (CIPRO) tablet 500 mg        500 mg Oral Daily with breakfast 11/13/19 1938     11/01/19 1000  cefTRIAXone (ROCEPHIN) 2 g in sodium chloride 0.9 % 100 mL IVPB  Status:  Discontinued        2 g 200 mL/hr over 30 Minutes Intravenous Daily 10/31/19 1521 11/09/19 1425   10/27/19 1000  cefTRIAXone (ROCEPHIN) 2 g in sodium chloride 0.9 % 100 mL IVPB  Status:  Discontinued        2 g 200 mL/hr over 30 Minutes Intravenous Daily 10/26/19 1559 10/31/19 1521   10/26/19 2000  cefTRIAXone (ROCEPHIN) 1 g in sodium chloride 0.9 % 100 mL IVPB        1 g 200 mL/hr over 30 Minutes Intravenous  Once 10/26/19 1559 10/26/19 2024   10/26/19 1600  cefTRIAXone (ROCEPHIN) 2 g in sodium chloride 0.9 % 100 mL IVPB  Status:  Discontinued        2 g 200 mL/hr over 30 Minutes Intravenous Every 24 hours 10/26/19 1351 10/26/19 1600   10/26/19 1000  metroNIDAZOLE (FLAGYL) IVPB 500 mg  Status:  Discontinued  500 mg 100 mL/hr over 60 Minutes Intravenous Every 8 hours 10/26/19 0909 10/29/19 1138   10/25/19 1445  azithromycin (ZITHROMAX) 500 mg in sodium chloride 0.9 % 250 mL IVPB  Status:  Discontinued        500 mg 250 mL/hr over 60 Minutes Intravenous Every 24 hours 10/25/19 1353 10/29/19 1138   10/25/19 1230  azithromycin (ZITHROMAX) tablet 500 mg  Status:  Discontinued        500 mg Oral Daily 10/25/19 1133 10/25/19 1134   10/25/19 1030  cefTRIAXone (ROCEPHIN) 1 g in sodium chloride 0.9 % 100 mL IVPB  Status:  Discontinued        1  g 200 mL/hr over 30 Minutes Intravenous Every 12 hours 10/25/19 0931 10/26/19 1351      Subjective: Still Requesting new bed, no other complaints  Objective: Vitals:   12/10/19 2351 12/11/19 0423 12/11/19 0842 12/11/19 1142  BP: (!) 122/105 105/84 103/86 107/81  Pulse: 91 68 67 70  Resp: 18 17 16 18   Temp: 97.8 F (36.6 C) 98 F (36.7 C) 98.1 F (36.7 C) 97.8 F (36.6 C)  TempSrc:  Oral Oral   SpO2: 96% 100% 100% 100%  Weight:      Height:        Intake/Output Summary (Last 24 hours) at 12/11/2019 1437 Last data filed at 12/11/2019 1015 Gross per 24 hour  Intake 358 ml  Output --  Net 358 ml   Filed Weights   12/01/19 0452 12/04/19 0500 12/05/19 0529  Weight: 106.7 kg 105.9 kg 105 kg    Examination: General exam: Awake, sitting in chair, ambulating the room Respiratory system: Normal respiratory effort, no wheezing  Data Reviewed: I have personally reviewed following labs and imaging studies  CBC: Recent Labs  Lab 12/07/19 0516 12/10/19 0435  WBC 5.0 5.6  HGB 9.0* 9.0*  HCT 30.8* 30.0*  MCV 77.4* 76.3*  PLT 365 761*   Basic Metabolic Panel: Recent Labs  Lab 12/07/19 0516 12/09/19 0507 12/10/19 0435  NA  --  139 138  K  --  3.4* 3.4*  CL  --  101 101  CO2  --  24 27  GLUCOSE  --  135* 132*  BUN  --  59* 57*  CREATININE 2.81* 2.91* 2.74*  CALCIUM  --  9.0 8.9   GFR: Estimated Creatinine Clearance: 25.4 mL/min (A) (by C-G formula based on SCr of 2.74 mg/dL (H)). Liver Function Tests: Recent Labs  Lab 12/09/19 0507  AST 21  ALT 9  ALKPHOS 185*  BILITOT 1.1  PROT 6.5  ALBUMIN 2.5*     Radiology Studies: No results found.  Scheduled Meds: . apixaban  5 mg Oral BID  . ciprofloxacin  500 mg Oral Q breakfast  . epoetin (EPOGEN/PROCRIT) injection  20,000 Units Subcutaneous Weekly  . iron polysaccharides  150 mg Oral Daily  . LORazepam  1 mg Intramuscular Once  . mouth rinse  15 mL Mouth Rinse BID  . melatonin  2.5 mg Oral QHS  .  midodrine  10 mg Oral TID WC  . multivitamin  1 tablet Oral QHS  . pantoprazole  40 mg Oral BID  . polyethylene glycol  17 g Oral Daily  . torsemide  40 mg Oral Daily  . traZODone  100 mg Oral QHS   Continuous Infusions:   LOS: 60 days   Max Sane, MD [facility] Pager On Amion  If 7PM-7AM, please contact night-coverage 12/11/2019, 2:37 PM

## 2019-12-11 NOTE — Plan of Care (Signed)
  Problem: Education: Goal: Knowledge of General Education information will improve Description: Including pain rating scale, medication(s)/side effects and non-pharmacologic comfort measures Outcome: Progressing   Problem: Health Behavior/Discharge Planning: Goal: Ability to manage health-related needs will improve Outcome: Progressing   Problem: Clinical Measurements: Goal: Ability to maintain clinical measurements within normal limits will improve Outcome: Progressing Goal: Will remain free from infection Outcome: Progressing Goal: Diagnostic test results will improve Outcome: Progressing Goal: Respiratory complications will improve Outcome: Progressing Goal: Cardiovascular complication will be avoided Outcome: Progressing   Problem: Activity: Goal: Risk for activity intolerance will decrease Outcome: Progressing   Problem: Nutrition: Goal: Adequate nutrition will be maintained Outcome: Progressing   Problem: Coping: Goal: Level of anxiety will decrease Outcome: Progressing   Problem: Elimination: Goal: Will not experience complications related to bowel motility Outcome: Progressing Goal: Will not experience complications related to urinary retention Outcome: Progressing   Problem: Pain Managment: Goal: General experience of comfort will improve Outcome: Progressing   Problem: Safety: Goal: Ability to remain free from injury will improve Outcome: Progressing   Problem: Skin Integrity: Goal: Risk for impaired skin integrity will decrease Outcome: Progressing   Problem: Education: Goal: Ability to demonstrate management of disease process will improve Outcome: Progressing Goal: Ability to verbalize understanding of medication therapies will improve Outcome: Progressing Goal: Individualized Educational Video(s) Outcome: Progressing   Problem: Activity: Goal: Capacity to carry out activities will improve Outcome: Progressing   Problem: Cardiac: Goal:  Ability to achieve and maintain adequate cardiopulmonary perfusion will improve Outcome: Progressing   Problem: Education: Goal: Knowledge of disease and its progression will improve Outcome: Progressing Goal: Individualized Educational Video(s) Outcome: Progressing   Problem: Fluid Volume: Goal: Compliance with measures to maintain balanced fluid volume will improve Outcome: Progressing   Problem: Health Behavior/Discharge Planning: Goal: Ability to manage health-related needs will improve Outcome: Progressing   Problem: Nutritional: Goal: Ability to make healthy dietary choices will improve Outcome: Progressing   Problem: Clinical Measurements: Goal: Complications related to the disease process, condition or treatment will be avoided or minimized Outcome: Progressing

## 2019-12-12 MED ORDER — HYDROCORTISONE 0.5 % EX CREA
TOPICAL_CREAM | Freq: Three times a day (TID) | CUTANEOUS | Status: DC | PRN
  Administered 2019-12-12 – 2019-12-18 (×3): 1 via TOPICAL
  Filled 2019-12-12 (×2): qty 28.35

## 2019-12-12 MED ORDER — KETOTIFEN FUMARATE 0.025 % OP SOLN
1.0000 [drp] | Freq: Two times a day (BID) | OPHTHALMIC | Status: DC
  Administered 2019-12-12 – 2020-01-29 (×86): 1 [drp] via OPHTHALMIC
  Filled 2019-12-12 (×3): qty 5

## 2019-12-12 NOTE — Plan of Care (Signed)
  Problem: Education: Goal: Knowledge of General Education information will improve Description: Including pain rating scale, medication(s)/side effects and non-pharmacologic comfort measures Outcome: Progressing   Problem: Health Behavior/Discharge Planning: Goal: Ability to manage health-related needs will improve Outcome: Progressing   Problem: Clinical Measurements: Goal: Ability to maintain clinical measurements within normal limits will improve Outcome: Progressing Goal: Will remain free from infection Outcome: Progressing Goal: Diagnostic test results will improve Outcome: Progressing Goal: Respiratory complications will improve Outcome: Progressing Goal: Cardiovascular complication will be avoided Outcome: Progressing   Problem: Activity: Goal: Risk for activity intolerance will decrease Outcome: Progressing   Problem: Nutrition: Goal: Adequate nutrition will be maintained Outcome: Progressing   Problem: Coping: Goal: Level of anxiety will decrease Outcome: Progressing   Problem: Elimination: Goal: Will not experience complications related to bowel motility Outcome: Progressing Goal: Will not experience complications related to urinary retention Outcome: Progressing   Problem: Pain Managment: Goal: General experience of comfort will improve Outcome: Progressing   Problem: Safety: Goal: Ability to remain free from injury will improve Outcome: Progressing   Problem: Skin Integrity: Goal: Risk for impaired skin integrity will decrease Outcome: Progressing   Problem: Education: Goal: Ability to demonstrate management of disease process will improve Outcome: Progressing Goal: Ability to verbalize understanding of medication therapies will improve Outcome: Progressing Goal: Individualized Educational Video(s) Outcome: Progressing   Problem: Activity: Goal: Capacity to carry out activities will improve Outcome: Progressing   Problem: Cardiac: Goal:  Ability to achieve and maintain adequate cardiopulmonary perfusion will improve Outcome: Progressing   Problem: Education: Goal: Knowledge of disease and its progression will improve Outcome: Progressing Goal: Individualized Educational Video(s) Outcome: Progressing   Problem: Fluid Volume: Goal: Compliance with measures to maintain balanced fluid volume will improve Outcome: Progressing   Problem: Health Behavior/Discharge Planning: Goal: Ability to manage health-related needs will improve Outcome: Progressing   Problem: Nutritional: Goal: Ability to make healthy dietary choices will improve Outcome: Progressing   Problem: Clinical Measurements: Goal: Complications related to the disease process, condition or treatment will be avoided or minimized Outcome: Progressing

## 2019-12-12 NOTE — Progress Notes (Signed)
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  After admission to the hospital, she was diagnosed with acute on chronic combinedsystolic and diastolic congestive heart failurewith ejection fraction 25 to 30%. She was not responding to IV Lasix. She was seen by nephrology, temporary dialysis catheter was performed and she was dialyzed on 10/17 and 10/18.Patient also complaining of abdominal pain, she had a paracentesis, study showed possible spontaneous peritonitis, she was treated with Rocephin and Flagyl.  Patient had a worsening respiratory status on 10/17, was diagnosed with aspiration pneumonia. She was treated with Rocephin, Zithromax and Flagyl. Patient also had abdominal pain and a CT scan suspect ischemia, but seen by general surgery on 10/19, no need for surgery.  Patient was evaluated by psychiatry and she does not has decision-making capacity. Family does not want to participate in making decisions for her. DSS case was filed for guardianship and still pending. Difficult disposition due to unsafe discharge.  Assessment & Plan:   Principal Problem:   Spontaneous bacterial peritonitis ([place]) Active Problems:   AKI (acute kidney injury) ([place])   Crack cocaine use   Acute respiratory failure with hypoxia (HCC)   LV (left ventricular) mural thrombus   Chronic kidney disease (CKD) stage G3b/A1, moderately decreased glomerular filtration rate (GFR) between 30-44 mL/min/1.73 square meter and albuminuria creatinine ratio less than 30 mg/g  (HCC)   Nonsustained ventricular tachycardia (HCC)   Acute CHF (congestive heart failure) (HCC)   Cocaine abuse ([place])   Peripheral edema   Cirrhosis of liver (HCC)   Dementia (HCC)   Aspiration pneumonia of both lower lobes due to gastric secretions (HCC)   Acute diastolic (congestive) heart failure ([place])   Cardiorenal disease   Goals of care, counseling/discussion   Palliative care by specialist   Ascites due to alcoholic cirrhosis ([place])   Spontaneous bacterial peritonitis: Resolved.  Repeat paracentesis showed significant decrease of white cell in ascites, antibiotics effective. However she still has a lot of neutrophils in 11/03/2019 fluid. Culture has been negative.Pt received many days ofRocephin, sincediscontinued.  -Pt is continued on cipro for prophylaxis. Remains stable   Acute on chronic combined systolic and diastolic congestive heart failure. Acute hypoxemic respiratory failure. -Echo from 06/22/2019 showed ejection fraction of 25 to 30% with grade 3 diastolic dysfunction. She was dialyzed x2, but now getting diuresed. Pt reported does not want to get dialysis again. - continue torsemide 40 mg daily -Continues on minimal O2 support as need  Liver cirrhosis with ascites:Noted. GI consulted previously in hospitalization (see prior notes). -Continue cipro for SBP prophylaxis  Dependent edema: Improving. -Patient has lower abdominal, lower back wall & leg edema-multifactorial in the setting of cirrhosis, CHF, renal failure & debility -Creatinine/GFR: Stable.  No respiratory symptoms.  Patient seems comfortable. -Continue with torsemide.  Hypotension: Remained stable.  Continue midodrine 10 mg TID.    Acute renal failure on chronic kidney disease stage IIIb: baseline creatinine appears to be 1.66. Creatinine peaked at 4.16, currently 2.74. -No plan for further dialysis per renal. Pt reportedly does not want dialysis again -renal transitioned to torsemide  40 mg-continue same. -Periodic checking of  renal function, improving slowly  Hyponatremia:  -Resolved  Left ventricular thrombus with history of CVA.Patient was initially concerned for possible GI bleed, anticoagulation was on hold. However, there is no GI bleed observed while in the hospital. It is not safe to continue hold anticoagulation. Anticoagulation has now been restarted with eliquis.  -Monitor hemoglobin. -Continue Eliquis   Overgrown toenails: will need outpatient podiatry follow up  Anemia of chronic disease: Hemoglobin stable -In the setting of CKD.  H&H remains stable around 7-8. -cont iron supplement -No indication of transfusion at this time. -EPO per nephrology.  Chronic thrombocytosis:  -Stable at this time, suspect reactive  Hypokalemia:Resolved.  GERD: Continue PPI   Depression/insomnia: Continue trazodone  Vaginal Itching: Resolved.  Chronic constipation: Cont. MiraLAX.  Morbid obesity. Body mass index is 37.36 kg/m today  Decision Making Capacity -Decision making capacity evaluated. No barriers to communication is present. The patient understands the overall medical situation However, the patient is unable to demonstrate insight to reasoning or rational for procedures recommended, such as dialysis. The patient did not demonstrate awareness of benefits or risks to treatment as well as lack of treatment. -Appreciate psychiatry evaluation on 11/30.  The do not believe she has a capacity to make rational decision regarding formulation of discharge plan.   DVT prophylaxis: Eliquis Code Status: DNR Family Communication: Pt in room, family not at bedside  Status is: Inpatient  Remains inpatient appropriate because:Unsafe d/c plan   Dispo: The patient is from: Home              Anticipated d/c is to: SNF              Anticipated d/c date is: > 3 days              Patient currently is medically stable to d/c. Pending dispo planning.   Guardianship in process?  Unable to make rational decision by herself and unable to get SNF is a rate limiting step likely contributing to longer length of stay.  Discharge is dependent on TOC able to find a safe place   Consultants:  Palliative Nephrology GI Psychiatry  Procedures:  10/20 perma cath insertion perma cath removal 11/5  Antimicrobials: Anti-infectives (From admission, onward)   Start     Dose/Rate Route Frequency Ordered Stop   11/14/19 0800  ciprofloxacin (CIPRO) tablet 500 mg        500 mg Oral Daily with breakfast 11/13/19 1938     11/01/19 1000  cefTRIAXone (ROCEPHIN) 2 g in sodium chloride 0.9 % 100 mL IVPB  Status:  Discontinued        2 g 200 mL/hr over 30 Minutes Intravenous Daily 10/31/19 1521 11/09/19 1425   10/27/19 1000  cefTRIAXone (ROCEPHIN) 2 g in sodium chloride 0.9 % 100 mL IVPB  Status:  Discontinued        2 g 200 mL/hr over 30 Minutes Intravenous Daily 10/26/19 1559 10/31/19 1521   10/26/19 2000  cefTRIAXone (ROCEPHIN) 1 g in sodium chloride 0.9 % 100 mL IVPB        1 g 200 mL/hr over 30 Minutes Intravenous  Once 10/26/19 1559 10/26/19 2024   10/26/19 1600  cefTRIAXone (ROCEPHIN) 2 g in sodium chloride 0.9 % 100 mL IVPB  Status:  Discontinued        2 g 200 mL/hr over 30 Minutes Intravenous Every 24 hours 10/26/19 1351 10/26/19 1600   10/26/19 1000  metroNIDAZOLE (FLAGYL) IVPB 500 mg  Status:  Discontinued  500 mg 100 mL/hr over 60 Minutes Intravenous Every 8 hours 10/26/19 0909 10/29/19 1138   10/25/19 1445  azithromycin (ZITHROMAX) 500 mg in sodium chloride 0.9 % 250 mL IVPB  Status:  Discontinued        500 mg 250 mL/hr over 60 Minutes Intravenous Every 24 hours 10/25/19 1353 10/29/19 1138   10/25/19 1230  azithromycin (ZITHROMAX) tablet 500 mg  Status:  Discontinued        500 mg Oral Daily 10/25/19 1133 10/25/19 1134   10/25/19 1030  cefTRIAXone (ROCEPHIN) 1 g in sodium chloride 0.9 % 100 mL IVPB  Status:  Discontinued        1 g  200 mL/hr over 30 Minutes Intravenous Every 12 hours 10/25/19 0931 10/26/19 1351      Subjective: Requesting to go out for fresh air as she is tired of being locked here.  Hoping that she will be able to get out of here soon.  No other complaints sitting in the bed  Objective: Vitals:   12/12/19 0510 12/12/19 0624 12/12/19 0801 12/12/19 1107  BP:  108/78 (!) 111/91 108/86  Pulse:  76 68 66  Resp:  17 17 17   Temp:  97.6 F (36.4 C) 97.7 F (36.5 C) 97.8 F (36.6 C)  TempSrc:   Oral Oral  SpO2:  100% 100% 100%  Weight: 104.8 kg     Height:       No intake or output data in the 24 hours ending 12/12/19 1332 Filed Weights   12/04/19 0500 12/05/19 0529 12/12/19 0510  Weight: 105.9 kg 105 kg 104.8 kg    Examination: General exam: Awake, sitting in chair, ambulating the room Respiratory system: Normal respiratory effort, no wheezing  Data Reviewed: I have personally reviewed following labs and imaging studies  CBC: Recent Labs  Lab 12/07/19 0516 12/10/19 0435  WBC 5.0 5.6  HGB 9.0* 9.0*  HCT 30.8* 30.0*  MCV 77.4* 76.3*  PLT 365 299*   Basic Metabolic Panel: Recent Labs  Lab 12/07/19 0516 12/09/19 0507 12/10/19 0435  NA  --  139 138  K  --  3.4* 3.4*  CL  --  101 101  CO2  --  24 27  GLUCOSE  --  135* 132*  BUN  --  59* 57*  CREATININE 2.81* 2.91* 2.74*  CALCIUM  --  9.0 8.9   GFR: Estimated Creatinine Clearance: 25.4 mL/min (A) (by C-G formula based on SCr of 2.74 mg/dL (H)). Liver Function Tests: Recent Labs  Lab 12/09/19 0507  AST 21  ALT 9  ALKPHOS 185*  BILITOT 1.1  PROT 6.5  ALBUMIN 2.5*     Radiology Studies: No results found.  Scheduled Meds: . apixaban  5 mg Oral BID  . ciprofloxacin  500 mg Oral Q breakfast  . epoetin (EPOGEN/PROCRIT) injection  20,000 Units Subcutaneous Weekly  . iron polysaccharides  150 mg Oral Daily  . ketotifen  1 drop Both Eyes BID  . LORazepam  1 mg Intramuscular Once  . mouth rinse  15 mL Mouth Rinse BID   . melatonin  2.5 mg Oral QHS  . midodrine  10 mg Oral TID WC  . multivitamin  1 tablet Oral QHS  . pantoprazole  40 mg Oral BID  . polyethylene glycol  17 g Oral Daily  . torsemide  40 mg Oral Daily  . traZODone  100 mg Oral QHS   Continuous Infusions:   LOS: 54 days   Vipul Manuella Ghazi,  MD [facility] Pager On Amion  If 7PM-7AM, please contact night-coverage 12/12/2019, 1:32 PM

## 2019-12-13 DIAGNOSIS — I5043 Acute on chronic combined systolic (congestive) and diastolic (congestive) heart failure: Secondary | ICD-10-CM

## 2019-12-13 LAB — CBC
HCT: 31.1 % — ABNORMAL LOW (ref 36.0–46.0)
Hemoglobin: 9.3 g/dL — ABNORMAL LOW (ref 12.0–15.0)
MCH: 22.7 pg — ABNORMAL LOW (ref 26.0–34.0)
MCHC: 29.9 g/dL — ABNORMAL LOW (ref 30.0–36.0)
MCV: 76 fL — ABNORMAL LOW (ref 80.0–100.0)
Platelets: 407 10*3/uL — ABNORMAL HIGH (ref 150–400)
RBC: 4.09 MIL/uL (ref 3.87–5.11)
RDW: 22 % — ABNORMAL HIGH (ref 11.5–15.5)
WBC: 5.3 10*3/uL (ref 4.0–10.5)
nRBC: 0 % (ref 0.0–0.2)

## 2019-12-13 LAB — CREATININE, SERUM
Creatinine, Ser: 2.63 mg/dL — ABNORMAL HIGH (ref 0.44–1.00)
GFR, Estimated: 20 mL/min — ABNORMAL LOW (ref >60)

## 2019-12-13 NOTE — Plan of Care (Signed)
  Problem: Clinical Measurements: Goal: Will remain free from infection Outcome: Progressing   Problem: Clinical Measurements: Goal: Respiratory complications will improve Outcome: Progressing   Problem: Health Behavior/Discharge Planning: Goal: Ability to manage health-related needs will improve Outcome: Progressing

## 2019-12-13 NOTE — Progress Notes (Signed)
PROGRESS NOTE    Katelyn Lane  ONG:[id number] DOB: 1955/12/25 DOA: 10/19/2019 PCP: [name], MD  Assessment & Plan:   Principal Problem:   Spontaneous bacterial peritonitis ([place]) Active Problems:   AKI (acute kidney injury) ([place])   Crack cocaine use   Acute respiratory failure with hypoxia (HCC)   LV (left ventricular) mural thrombus   Chronic kidney disease (CKD) stage G3b/A1, moderately decreased glomerular filtration rate (GFR) between 30-44 mL/min/1.73 square meter and albuminuria creatinine ratio less than 30 mg/g (HCC)   Nonsustained ventricular tachycardia (HCC)   Acute CHF (congestive heart failure) (HCC)   Cocaine abuse ([place])   Peripheral edema   Cirrhosis of liver (HCC)   Dementia (HCC)   Aspiration pneumonia of both lower lobes due to gastric secretions (HCC)   Acute diastolic (congestive) heart failure ([place])   Cardiorenal disease   Goals of care, counseling/discussion   Palliative care by specialist   Ascites due to alcoholic cirrhosis ([place])   Spontaneous bacterial peritonitis: resolved. Repeat paracentesis showed significant decrease of white cell in ascites, antibiotics effective. A lot of neutrophils in 11/03/2019 fluid. Culture has been negative. IV abxs have been d/c. Continue on cipro for prophylaxis.   Acute on chronic combined systolic and diastolic CHF: echo on 4/40/10 shows EF of 25-30%, grade III diastolic dysfunction. Continue on torsemide. Received HD x 2 but does not want HD again  Acute hypoxemic respiratory failure: likely secondary to CHF. Weaned off of supplemental oxygen.   Liver cirrhosis: with ascites. Continue on torsemide. Will repeat US abd tomorrow to evaluate ascites, may need repeat paracentesis   Dependent edema: secondary to cirrhosis, CHF, & CKD. Continue on torsemide.   Hypotension:continue on midodrine   AKI on CKDIIIb: baseline creatinine appears to be 1.66.  Cr is trending down from day prior. Continue on  torsemide. Avoid nephrotoxic meds   Hyponatremia:  Left ventricular thrombus: with history of CVA. Continue on eliquis   Overgrown toenails: will need outpatient podiatry follow up  Anemia of chronic disease: continue on iron supplements. No transfusion needed at this time. Will continue to monitor   Chronic thrombocytosis: will continue to monitor   Hypokalemia:resolved   GERD:continue on PPI   Depression/insomnia: severity unknown.Continue home dose of trazodone   Vaginal Itching:resolved.  Chronic constipation: continue on miralax  Morbid obesity: BMI 37.2. Complicates overall prognosis and care  Decision Making Capacity: psych does not believe the pt has the pt has the capacity to make rational decision in regards to formulating a discharge plan. No evidence of imminent risk to self or others currently as per psych  .   DVT prophylaxis: eliquis  Code Status: DNR Family Communication:  Disposition Plan:   Status is: Inpatient  Remains inpatient appropriate because:Ongoing diagnostic testing needed not appropriate for outpatient work up, Unsafe d/c plan and IV treatments appropriate due to intensity of illness or inability to take PO, pt is unable to make decisions in regards to d/c plan. TOC is working on this.    Dispo: The patient is from: Home              Anticipated d/c is to: SNF vs home health               Anticipated d/c date is: > 3 days              Patient currently is not medically stable to d/c.      Consultants:   Vascular surg  Procedures:   Antimicrobials: po cipro    Subjective: Pt c/o abd pain & distention   Objective: Vitals:   12/12/19 1107 12/12/19 2029 12/13/19 0014 12/13/19 0502  BP: 108/86 (!) 113/97 109/70 124/85  Pulse: 66 81 76 82  Resp: 17 18 17 18   Temp: 97.8 F (36.6 C) 97.9 F (36.6 C) 98.2 F (36.8 C) 98.3 F (36.8 C)  TempSrc: Oral Oral Oral   SpO2: 100% 100% 100% 100%  Weight:       Height:       No intake or output data in the 24 hours ending 12/13/19 0842 Filed Weights   12/04/19 0500 12/05/19 0529 12/12/19 0510  Weight: 105.9 kg 105 kg 104.8 kg    Examination:  General exam: Appears calm and comfortable  Respiratory system: Clear to auscultation. Respiratory effort normal. Cardiovascular system: S1 & S2+. No rubs, gallops or clicks.  Gastrointestinal system: Abdomen is distended, soft and nontender. Hypoactive bowel sounds heard. Central nervous system: Alert and oriented. Moves all 4 extremities  Psychiatry: Judgement and insight appear normal. Mood & affect appropriate.     Data Reviewed: I have personally reviewed following labs and imaging studies  CBC: Recent Labs  Lab 12/07/19 0516 12/10/19 0435 12/13/19 0452  WBC 5.0 5.6 5.3  HGB 9.0* 9.0* 9.3*  HCT 30.8* 30.0* 31.1*  MCV 77.4* 76.3* 76.0*  PLT 365 405* 119*   Basic Metabolic Panel: Recent Labs  Lab 12/07/19 0516 12/09/19 0507 12/10/19 0435 12/13/19 0452  NA  --  139 138  --   K  --  3.4* 3.4*  --   CL  --  101 101  --   CO2  --  24 27  --   GLUCOSE  --  135* 132*  --   BUN  --  59* 57*  --   CREATININE 2.81* 2.91* 2.74* 2.63*  CALCIUM  --  9.0 8.9  --    GFR: Estimated Creatinine Clearance: 26.4 mL/min (A) (by C-G formula based on SCr of 2.63 mg/dL (H)). Liver Function Tests: Recent Labs  Lab 12/09/19 0507  AST 21  ALT 9  ALKPHOS 185*  BILITOT 1.1  PROT 6.5  ALBUMIN 2.5*   No results for input(s): LIPASE, AMYLASE in the last 168 hours. No results for input(s): AMMONIA in the last 168 hours. Coagulation Profile: No results for input(s): INR, PROTIME in the last 168 hours. Cardiac Enzymes: No results for input(s): CKTOTAL, CKMB, CKMBINDEX, TROPONINI in the last 168 hours. BNP (last 3 results) No results for input(s): PROBNP in the last 8760 hours. HbA1C: No results for input(s): HGBA1C in the last 72 hours. CBG: No results for input(s): GLUCAP in the last 168  hours. Lipid Profile: No results for input(s): CHOL, HDL, LDLCALC, TRIG, CHOLHDL, LDLDIRECT in the last 72 hours. Thyroid Function Tests: No results for input(s): TSH, T4TOTAL, FREET4, T3FREE, THYROIDAB in the last 72 hours. Anemia Panel: No results for input(s): VITAMINB12, FOLATE, FERRITIN, TIBC, IRON, RETICCTPCT in the last 72 hours. Sepsis Labs: No results for input(s): PROCALCITON, LATICACIDVEN in the last 168 hours.  No results found for this or any previous visit (from the past 240 hour(s)).       Radiology Studies: No results found.      Scheduled Meds: . apixaban  5 mg Oral BID  . ciprofloxacin  500 mg Oral Q breakfast  . epoetin (EPOGEN/PROCRIT) injection  20,000 Units Subcutaneous Weekly  . iron polysaccharides  150 mg Oral Daily  .  ketotifen  1 drop Both Eyes BID  . LORazepam  1 mg Intramuscular Once  . mouth rinse  15 mL Mouth Rinse BID  . melatonin  2.5 mg Oral QHS  . midodrine  10 mg Oral TID WC  . multivitamin  1 tablet Oral QHS  . pantoprazole  40 mg Oral BID  . polyethylene glycol  17 g Oral Daily  . torsemide  40 mg Oral Daily  . traZODone  100 mg Oral QHS   Continuous Infusions:   LOS: 55 days    Time spent: 31 mins     [name], MD [facility] Pager [phone]  If 7PM-7AM, please contact night-coverage 12/13/2019, 8:42 AM

## 2019-12-14 ENCOUNTER — Inpatient Hospital Stay: Payer: Medicaid Other

## 2019-12-14 LAB — CBC
HCT: 30.3 % — ABNORMAL LOW (ref 36.0–46.0)
Hemoglobin: 9.4 g/dL — ABNORMAL LOW (ref 12.0–15.0)
MCH: 22.9 pg — ABNORMAL LOW (ref 26.0–34.0)
MCHC: 31 g/dL (ref 30.0–36.0)
MCV: 73.9 fL — ABNORMAL LOW (ref 80.0–100.0)
Platelets: 425 10*3/uL — ABNORMAL HIGH (ref 150–400)
RBC: 4.1 MIL/uL (ref 3.87–5.11)
RDW: 21.4 % — ABNORMAL HIGH (ref 11.5–15.5)
WBC: 4.9 10*3/uL (ref 4.0–10.5)
nRBC: 0 % (ref 0.0–0.2)

## 2019-12-14 LAB — BASIC METABOLIC PANEL
Anion gap: 11 (ref 5–15)
BUN: 58 mg/dL — ABNORMAL HIGH (ref 8–23)
CO2: 25 mmol/L (ref 22–32)
Calcium: 9.2 mg/dL (ref 8.9–10.3)
Chloride: 101 mmol/L (ref 98–111)
Creatinine, Ser: 2.74 mg/dL — ABNORMAL HIGH (ref 0.44–1.00)
GFR, Estimated: 19 mL/min — ABNORMAL LOW (ref >60)
Glucose, Bld: 129 mg/dL — ABNORMAL HIGH (ref 70–99)
Potassium: 3.7 mmol/L (ref 3.5–5.1)
Sodium: 137 mmol/L (ref 135–145)

## 2019-12-14 NOTE — Plan of Care (Signed)
  Problem: Education: Goal: Knowledge of General Education information will improve Description: Including pain rating scale, medication(s)/side effects and non-pharmacologic comfort measures Outcome: Not Progressing   Problem: Health Behavior/Discharge Planning: Goal: Ability to manage health-related needs will improve Outcome: Progressing   Problem: Clinical Measurements: Goal: Ability to maintain clinical measurements within normal limits will improve Outcome: Progressing Goal: Will remain free from infection Outcome: Progressing Goal: Diagnostic test results will improve Outcome: Progressing Goal: Respiratory complications will improve Outcome: Progressing Goal: Cardiovascular complication will be avoided Outcome: Progressing   Problem: Activity: Goal: Risk for activity intolerance will decrease Outcome: Progressing   Problem: Nutrition: Goal: Adequate nutrition will be maintained Outcome: Progressing   Problem: Activity: Goal: Risk for activity intolerance will decrease Outcome: Progressing   Problem: Nutrition: Goal: Adequate nutrition will be maintained Outcome: Progressing   Problem: Coping: Goal: Level of anxiety will decrease Outcome: Progressing   Problem: Elimination: Goal: Will not experience complications related to bowel motility Outcome: Progressing Goal: Will not experience complications related to urinary retention Outcome: Progressing   Problem: Pain Managment: Goal: General experience of comfort will improve Outcome: Progressing   Problem: Safety: Goal: Ability to remain free from injury will improve Outcome: Progressing   Problem: Skin Integrity: Goal: Risk for impaired skin integrity will decrease Outcome: Progressing   Problem: Education: Goal: Ability to demonstrate management of disease process will improve Outcome: Not Progressing Goal: Ability to verbalize understanding of medication therapies will improve Outcome: Not  Progressing Goal: Individualized Educational Video(s) Outcome: Not Progressing   Problem: Activity: Goal: Capacity to carry out activities will improve Outcome: Progressing   Problem: Cardiac: Goal: Ability to achieve and maintain adequate cardiopulmonary perfusion will improve Outcome: Not Progressing   Problem: Education: Goal: Knowledge of disease and its progression will improve Outcome: Progressing Goal: Individualized Educational Video(s) Outcome: Progressing   Problem: Fluid Volume: Goal: Compliance with measures to maintain balanced fluid volume will improve Outcome: Progressing   Problem: Health Behavior/Discharge Planning: Goal: Ability to manage health-related needs will improve Outcome: Progressing

## 2019-12-14 NOTE — Progress Notes (Signed)
PROGRESS NOTE    Katelyn Lane  CLE:[id number] DOB: 1955/08/29 DOA: 10/19/2019 PCP: [name], MD  Assessment & Plan:   Principal Problem:   Spontaneous bacterial peritonitis ([place]) Active Problems:   AKI (acute kidney injury) ([place])   Crack cocaine use   Acute respiratory failure with hypoxia (HCC)   LV (left ventricular) mural thrombus   Chronic kidney disease (CKD) stage G3b/A1, moderately decreased glomerular filtration rate (GFR) between 30-44 mL/min/1.73 square meter and albuminuria creatinine ratio less than 30 mg/g (HCC)   Nonsustained ventricular tachycardia (HCC)   Acute CHF (congestive heart failure) (HCC)   Cocaine abuse ([place])   Peripheral edema   Cirrhosis of liver (HCC)   Dementia (HCC)   Aspiration pneumonia of both lower lobes due to gastric secretions (HCC)   Acute diastolic (congestive) heart failure ([place])   Cardiorenal disease   Goals of care, counseling/discussion   Palliative care by specialist   Ascites due to alcoholic cirrhosis ([place])   Spontaneous bacterial peritonitis: resolved. Repeat paracentesis showed significant decrease of white cell in ascites, antibiotics effective. A lot of neutrophils in 11/03/2019 fluid. Culture has been negative. IV abxs have been d/c. Continue on cipro for prophylaxis.   Acute on chronic combined systolic and diastolic CHF: echo on 9/44/96 shows EF of 25-30%, grade III diastolic dysfunction. Continue on torsemide. Received HD x 2 but does not want HD again  Acute hypoxemic respiratory failure: likely secondary to CHF. Weaned off of supplemental oxygen. Resolbed  Liver cirrhosis: with ascites. Continue on torsemide. Will repeat US abd today to evaluate for ascites. May need repeat paracentesis.   Dependent edema: secondary to cirrhosis, CHF, & CKD. Continue on torsemide.   Hypotension:continue on midodrine   AKI on CKDIIIb: baseline Cr is 1.66.  Cr is labile. Continue on torsemide. Avoid nephrotoxic  meds   Hyponatremia:WNL today. 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 Continue on eliquis   Overgrown toenails: will need outpatient podiatry follow up  Anemia of chronic disease: continue on iron supplements. No transfusion needed at this time. Will continue to monitor   Chronic thrombocytosis: labile. Will continue to monitor   Hypokalemia: WNL today   GERD:continue on PPI   Depression/insomnia: severity unknown.Continue home dose of trazodone   Vaginal Itching:resolved.  Chronic constipation: continue on miralax  Morbid obesity: BMI 37.2. Complicates overall prognosis and care  Decision Making Capacity: psych does not believe the pt has the pt has the capacity to make rational decision in regards to formulating a discharge plan. No evidence of imminent risk to self or others currently as per psych  .   DVT prophylaxis: eliquis  Code Status: DNR Family Communication:  Disposition Plan:   Status is: Inpatient  Remains inpatient appropriate because:Ongoing diagnostic testing needed not appropriate for outpatient work up, Unsafe d/c plan and IV treatments appropriate due to intensity of illness or inability to take PO, pt is unable to make decisions in regards to d/c plan. TOC is still working on d/c plan.    Dispo: The patient is from: Home              Anticipated d/c is to: SNF vs home health               Anticipated d/c date is: > 3 days              Patient currently is not medically stable to d/c.      Consultants:   Vascular surg  Procedures:   Antimicrobials: po cipro    Subjective: Pt c/o abd distention   Objective: Vitals:   12/13/19 1145 12/13/19 1936 12/13/19 2355 12/14/19 0739  BP: (!) 124/93 (!) 122/98 (!) 127/98 99/72  Pulse: 92 86 (!) 57 66  Resp: 15 20 18 20   Temp: (!) 97.1 F (36.2 C) 98.2 F (36.8 C) (!) 97.5 F (36.4 C) 98.2 F (36.8 C)  TempSrc:  Oral  Oral  SpO2: 98% 100% 100% 98%   Weight:      Height:       No intake or output data in the 24 hours ending 12/14/19 0759 Filed Weights   12/04/19 0500 12/05/19 0529 12/12/19 0510  Weight: 105.9 kg 105 kg 104.8 kg    Examination:  General exam: Appears calm & comfortable   Respiratory system: clear breath sounds b/l  Cardiovascular system: S1/S2+. No rubs or gallops  Gastrointestinal system: Abd is soft, distended, non-tender & hypoactive bowel sounds  Central nervous system: Alert and oriented. Moves all 4 extremities  Psychiatry: Judgement and insight appear normal. Mood & affect appropriate.     Data Reviewed: I have personally reviewed following labs and imaging studies  CBC: Recent Labs  Lab 12/10/19 0435 12/13/19 0452 12/14/19 0437  WBC 5.6 5.3 4.9  HGB 9.0* 9.3* 9.4*  HCT 30.0* 31.1* 30.3*  MCV 76.3* 76.0* 73.9*  PLT 405* 407* 485*   Basic Metabolic Panel: Recent Labs  Lab 12/09/19 0507 12/10/19 0435 12/13/19 0452 12/14/19 0437  NA 139 138  --  137  K 3.4* 3.4*  --  3.7  CL 101 101  --  101  CO2 24 27  --  25  GLUCOSE 135* 132*  --  129*  BUN 59* 57*  --  58*  CREATININE 2.91* 2.74* 2.63* 2.74*  CALCIUM 9.0 8.9  --  9.2   GFR: Estimated Creatinine Clearance: 25.4 mL/min (A) (by C-G formula based on SCr of 2.74 mg/dL (H)). Liver Function Tests: Recent Labs  Lab 12/09/19 0507  AST 21  ALT 9  ALKPHOS 185*  BILITOT 1.1  PROT 6.5  ALBUMIN 2.5*   No results for input(s): LIPASE, AMYLASE in the last 168 hours. No results for input(s): AMMONIA in the last 168 hours. Coagulation Profile: No results for input(s): INR, PROTIME in the last 168 hours. Cardiac Enzymes: No results for input(s): CKTOTAL, CKMB, CKMBINDEX, TROPONINI in the last 168 hours. BNP (last 3 results) No results for input(s): PROBNP in the last 8760 hours. HbA1C: No results for input(s): HGBA1C in the last 72 hours. CBG: No results for input(s): GLUCAP in the last 168 hours. Lipid Profile: No results for  input(s): CHOL, HDL, LDLCALC, TRIG, CHOLHDL, LDLDIRECT in the last 72 hours. Thyroid Function Tests: No results for input(s): TSH, T4TOTAL, FREET4, T3FREE, THYROIDAB in the last 72 hours. Anemia Panel: No results for input(s): VITAMINB12, FOLATE, FERRITIN, TIBC, IRON, RETICCTPCT in the last 72 hours. Sepsis Labs: No results for input(s): PROCALCITON, LATICACIDVEN in the last 168 hours.  No results found for this or any previous visit (from the past 240 hour(s)).       Radiology Studies: No results found.      Scheduled Meds: . apixaban  5 mg Oral BID  . ciprofloxacin  500 mg Oral Q breakfast  . epoetin (EPOGEN/PROCRIT) injection  20,000 Units Subcutaneous Weekly  . iron polysaccharides  150 mg Oral Daily  . ketotifen  1 drop Both Eyes BID  . LORazepam  1 mg  Intramuscular Once  . mouth rinse  15 mL Mouth Rinse BID  . melatonin  2.5 mg Oral QHS  . midodrine  10 mg Oral TID WC  . multivitamin  1 tablet Oral QHS  . pantoprazole  40 mg Oral BID  . polyethylene glycol  17 g Oral Daily  . torsemide  40 mg Oral Daily  . traZODone  100 mg Oral QHS   Continuous Infusions:   LOS: 56 days    Time spent: 31 mins     [name], MD [facility] Pager [phone]  If 7PM-7AM, please contact night-coverage 12/14/2019, 7:59 AM

## 2019-12-15 ENCOUNTER — Inpatient Hospital Stay: Payer: Medicaid Other

## 2019-12-15 LAB — BASIC METABOLIC PANEL
Anion gap: 11 (ref 5–15)
BUN: 59 mg/dL — ABNORMAL HIGH (ref 8–23)
CO2: 26 mmol/L (ref 22–32)
Calcium: 9.1 mg/dL (ref 8.9–10.3)
Chloride: 101 mmol/L (ref 98–111)
Creatinine, Ser: 2.81 mg/dL — ABNORMAL HIGH (ref 0.44–1.00)
GFR, Estimated: 18 mL/min — ABNORMAL LOW (ref >60)
Glucose, Bld: 88 mg/dL (ref 70–99)
Potassium: 4.2 mmol/L (ref 3.5–5.1)
Sodium: 138 mmol/L (ref 135–145)

## 2019-12-15 LAB — CBC
HCT: 30.5 % — ABNORMAL LOW (ref 36.0–46.0)
Hemoglobin: 9.3 g/dL — ABNORMAL LOW (ref 12.0–15.0)
MCH: 23.2 pg — ABNORMAL LOW (ref 26.0–34.0)
MCHC: 30.5 g/dL (ref 30.0–36.0)
MCV: 76.1 fL — ABNORMAL LOW (ref 80.0–100.0)
Platelets: 380 10*3/uL (ref 150–400)
RBC: 4.01 MIL/uL (ref 3.87–5.11)
RDW: 21.7 % — ABNORMAL HIGH (ref 11.5–15.5)
WBC: 5.7 10*3/uL (ref 4.0–10.5)
nRBC: 0 % (ref 0.0–0.2)

## 2019-12-15 NOTE — Progress Notes (Signed)
PROGRESS NOTE   Hospital Course from Dr. Carlynn Spry: 64 y.o.femalewith medical history significant forcombined diastolic and systolic heart failure, CKD stage IIIb, history of CVA, hypertension, hyperlipidemia, ventricular thrombus on Xarelto and cocaine use who presents with concerns of lower extremity edema and increasing shortness of breath.Found to have BNP greater than 4500, elevated troponin and AKI.  After admission to the hospital, she was diagnosed with acute on chronic combinedsystolic and diastolic congestive heart failurewith ejection fraction 25 to 30%. She was not responding to IV Lasix. She was seen by nephrology, temporary dialysis catheter was performed and she was dialyzed on 10/17 and 10/18.Patient also complaining of abdominal pain, she had a paracentesis, study showed possible spontaneous peritonitis, she was treated with Rocephin and Flagyl.  Patient had a worsening respiratory status on 10/17, was diagnosed with aspiration pneumonia. She was treated with Rocephin, Zithromax and Flagyl.Patient also had abdominal pain and a CT scan suspect ischemia, but seen by general surgery on 10/19, no need for surgery.  Patient was evaluated by psychiatry and she does not has decision-making capacity. Family does not want to participate in making decisions for her. DSS case was filed for guardianship and still pending. Difficult disposition due to unsafe discharge.   Hospital Course from Dr. Jimmye Norman 12/5-12/7/21: Pt c/o abd distention and repeat US showed moderate volume ascites. A therapeutic paracentesis was done on 12/15/19 and 3.9L of yellow fluid was removed. CM is still working on placement   [employer]  WCH:[id number] DOB: 12-Apr-1955 DOA: 10/19/2019 PCP: [name], MD  Assessment & Plan:   Principal Problem:   Spontaneous bacterial peritonitis ([place]) Active Problems:   AKI (acute kidney injury) ([place])   Crack cocaine use   Acute respiratory  failure with hypoxia (HCC)   LV (left ventricular) mural thrombus   Chronic kidney disease (CKD) stage G3b/A1, moderately decreased glomerular filtration rate (GFR) between 30-44 mL/min/1.73 square meter and albuminuria creatinine ratio less than 30 mg/g (HCC)   Nonsustained ventricular tachycardia (HCC)   Acute CHF (congestive heart failure) (HCC)   Cocaine abuse ([place])   Peripheral edema   Cirrhosis of liver (HCC)   Dementia (HCC)   Aspiration pneumonia of both lower lobes due to gastric secretions (HCC)   Acute diastolic (congestive) heart failure ([place])   Cardiorenal disease   Goals of care, counseling/discussion   Palliative care by specialist   Ascites due to alcoholic cirrhosis ([place])   Spontaneous bacterial peritonitis: resolved. Repeat paracentesis showed significant decrease of white cell in ascites, antibiotics effective. A lot of neutrophils in 11/03/2019 fluid. Culture has been negative. IV abxs have been d/c. Continue on cipro for prophylaxis.   Acute on chronic combined systolic and diastolic CHF: echo on 3/53/61 shows EF of 25-30%, grade III diastolic dysfunction. Continue on torsemide. Received HD x 2 but does not want HD again  Acute hypoxemic respiratory failure: likely secondary to CHF. Weaned off of supplemental oxygen. Resolbed  Liver cirrhosis: with ascites. Continue on torsemide. S/p therapeutic paracentesis done 12/15/19 w/ 3.9L of fluid removed.   Dependent edema: secondary to CKD, CHF & cirrhosis. Continue on torsemide.   Hypotension:continue on midodrine   AKI on CKDIIIb: baseline Cr is 1.66. Cr is labile. Will continue to monitor   Hyponatremia:resolved  Left ventricular thrombus: w/ hx of CVA. Continue on eliquis   Overgrown toenails: will need outpatient podiatry f/u   Anemia of chronic disease: continue on iron supplements. H&H are stable  Chronic thrombocytosis: resolved   Hypokalemia: within normal  limits today   GERD:continue on  PPI   Depression/insomnia: severity unknown.Continue home dose of trazodone   Vaginal Itching:resolved.  Chronic constipation: continue on miralax  Morbid obesity: BMI 37.2. Complicates overall prognosis and care  Decision Making Capacity: psych does not believe the pt has the pt has the capacity to make rational decision in regards to formulating a discharge plan. No evidence of imminent risk to self or others currently as per psych  .   DVT prophylaxis: eliquis  Code Status: DNR Family Communication:  Disposition Plan: awaiting TOC recs, unsafe d/c plan   Status is: Inpatient  Remains inpatient appropriate because:Ongoing diagnostic testing needed not appropriate for outpatient work up, Unsafe d/c plan and IV treatments appropriate due to intensity of illness or inability to take PO, pt is unable to make decisions in regards to d/c plan. TOC is still working on d/c plan.    Dispo: The patient is from: Home              Anticipated d/c is to: SNF vs home health               Anticipated d/c date is: > 3 days              Patient currently is not medically stable to d/c.      Consultants:   Vascular surg    Procedures:   Antimicrobials: po cipro    Subjective: Pt c/o fatigue   Objective: Vitals:   12/14/19 1132 12/14/19 2044 12/14/19 2352 12/15/19 0402  BP: 104/63 108/74 103/83 109/89  Pulse: 81 79 82 88  Resp: 20 20 18 20   Temp: 98.2 F (36.8 C) 97.6 F (36.4 C) 97.7 F (36.5 C) 98 F (36.7 C)  TempSrc:    Oral  SpO2: 99% 100% 100% 100%  Weight:      Height:       No intake or output data in the 24 hours ending 12/15/19 0758 Filed Weights   12/04/19 0500 12/05/19 0529 12/12/19 0510  Weight: 105.9 kg 105 kg 104.8 kg    Examination:  General exam: Appear calm & comfortable  Respiratory system: clear breath sounds b/l  Cardiovascular system: S1/S2+. No rubs or gallops  Gastrointestinal system: Abd is soft, distended, & hypoactive bowel  sounds  Central nervous system: Alert and oriented. Moves all 4 extremities  Psychiatry: Judgement and insight appear normal. Mood & affect appropriate.     Data Reviewed: I have personally reviewed following labs and imaging studies  CBC: Recent Labs  Lab 12/10/19 0435 12/13/19 0452 12/14/19 0437 12/15/19 0507  WBC 5.6 5.3 4.9 5.7  HGB 9.0* 9.3* 9.4* 9.3*  HCT 30.0* 31.1* 30.3* 30.5*  MCV 76.3* 76.0* 73.9* 76.1*  PLT 405* 407* 425* 270   Basic Metabolic Panel: Recent Labs  Lab 12/09/19 0507 12/10/19 0435 12/13/19 0452 12/14/19 0437 12/15/19 0507  NA 139 138  --  137 138  K 3.4* 3.4*  --  3.7 4.2  CL 101 101  --  101 101  CO2 24 27  --  25 26  GLUCOSE 135* 132*  --  129* 88  BUN 59* 57*  --  58* 59*  CREATININE 2.91* 2.74* 2.63* 2.74* 2.81*  CALCIUM 9.0 8.9  --  9.2 9.1   GFR: Estimated Creatinine Clearance: 24.7 mL/min (A) (by C-G formula based on SCr of 2.81 mg/dL (H)). Liver Function Tests: Recent Labs  Lab 12/09/19 0507  AST 21  ALT 9  ALKPHOS 185*  BILITOT 1.1  PROT 6.5  ALBUMIN 2.5*   No results for input(s): LIPASE, AMYLASE in the last 168 hours. No results for input(s): AMMONIA in the last 168 hours. Coagulation Profile: No results for input(s): INR, PROTIME in the last 168 hours. Cardiac Enzymes: No results for input(s): CKTOTAL, CKMB, CKMBINDEX, TROPONINI in the last 168 hours. BNP (last 3 results) No results for input(s): PROBNP in the last 8760 hours. HbA1C: No results for input(s): HGBA1C in the last 72 hours. CBG: No results for input(s): GLUCAP in the last 168 hours. Lipid Profile: No results for input(s): CHOL, HDL, LDLCALC, TRIG, CHOLHDL, LDLDIRECT in the last 72 hours. Thyroid Function Tests: No results for input(s): TSH, T4TOTAL, FREET4, T3FREE, THYROIDAB in the last 72 hours. Anemia Panel: No results for input(s): VITAMINB12, FOLATE, FERRITIN, TIBC, IRON, RETICCTPCT in the last 72 hours. Sepsis Labs: No results for input(s):  PROCALCITON, LATICACIDVEN in the last 168 hours.  No results found for this or any previous visit (from the past 240 hour(s)).       Radiology Studies: [name] ASCITES (ABDOMEN LIMITED)  Result Date: 12/14/2019 CLINICAL DATA:  Evaluate for ascites in a patient with history of prior paracentesis. EXAM: LIMITED ABDOMEN ULTRASOUND FOR ASCITES TECHNIQUE: Limited ultrasound survey for ascites was performed in all four abdominal quadrants. COMPARISON:  Abdomen and pelvis CT from October of 2021 FINDINGS: Moderate volume of ascites seen in the abdomen, fluid in all 4 quadrants with moderate amount in midline. Areas of septation are visible. IMPRESSION: Moderate volume ascites largest pocket in the midline with areas of loculation. Electronically Signed   By: Zetta Bills M.D.   On: 12/14/2019 12:34        Scheduled Meds: . apixaban  5 mg Oral BID  . ciprofloxacin  500 mg Oral Q breakfast  . epoetin (EPOGEN/PROCRIT) injection  20,000 Units Subcutaneous Weekly  . iron polysaccharides  150 mg Oral Daily  . ketotifen  1 drop Both Eyes BID  . LORazepam  1 mg Intramuscular Once  . mouth rinse  15 mL Mouth Rinse BID  . melatonin  2.5 mg Oral QHS  . midodrine  10 mg Oral TID WC  . multivitamin  1 tablet Oral QHS  . pantoprazole  40 mg Oral BID  . polyethylene glycol  17 g Oral Daily  . torsemide  40 mg Oral Daily  . traZODone  100 mg Oral QHS   Continuous Infusions:   LOS: 57 days    Time spent: 33 mins     [name], MD [facility] Pager [phone]  If 7PM-7AM, please contact night-coverage 12/15/2019, 7:58 AM

## 2019-12-15 NOTE — Progress Notes (Signed)
Physical Therapy Treatment Patient Details Name: Katelyn Lane MRN: [id number] DOB: 10-May-1955 Today's Date: 12/15/2019    History of Present Illness Pt is a 64 yo female who presented to ER secondary to LE edema, progressive SOB; admitted for management of acute/chronic CHF, spontaneous bacterial peritonitis (s/p paracentesis 10/25).  Hospital course also significant for initiation of dialysis (initially, R temp fem cath; converted to R permcath).  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    PT Comments    Pt was long sitting in bed upon arriving. She quuickly jumps OOB and is eager for OOB activity. Pt first stood and urinated in [facility] without use of AD prior to ambulating 400 ft with RW. No LOB with use of RW however opnce returned to room, pt leaves RW and ambulated without AD to BR. Severe unsteadiness noted. Highly recommend use of AD at all times pt is on her feet. She continues to progress towards all PT goals. Recommend 24 hour supervision  + [place] services at DC.    Follow Up Recommendations  Home health PT;Supervision/Assistance - 24 hour     Equipment Recommendations  Rolling walker with 5" wheels;3in1 (PT)    Recommendations for Other Services       Precautions / Restrictions Precautions Precautions: Fall Restrictions Weight Bearing Restrictions: No    Mobility  Bed Mobility Overal bed mobility: Modified Independent      General bed mobility comments: HOB slightly elevated. no Vcs for physical assistance required  Transfers Overall transfer level: Needs assistance Equipment used:  Rolling walker (2 wheeled);None Transfers: Sit to/from Stand Sit to Stand: Supervision    Ambulation/Gait Ambulation/Gait assistance: Supervision Gait Distance (Feet): 400 Feet Assistive device: Rolling walker (2 wheeled) Gait Pattern/deviations: Step-through pattern;Trunk flexed Gait velocity: WNL   General Gait Details: Vcs only for posture correction. no physical assistance required. pt ambulated from [facility] to BR without AD using walls/furniture for safety       Balance Overall balance assessment: Needs assistance Sitting-balance support: Feet supported;No upper extremity supported Sitting balance-Leahy Scale: Good     Standing balance support: No upper extremity supported;During functional activity Standing balance-Leahy Scale: Fair Standing balance comment: pt is unsteady with ambulation without AD         Cognition Arousal/Alertness: Awake/alert Behavior During Therapy: Impulsive Overall Cognitive Status: Within Functional Limits for tasks assessed      General Comments: Pt is Alert and conversational throughout. motivated to perform OOB activity             Pertinent Vitals/Pain Pain Assessment: No/denies pain Pain Score: 0-No pain           PT Goals (current goals can now be found in the care plan section) Acute Rehab PT Goals Patient Stated Goal: none stated Progress towards PT goals: Progressing toward goals    Frequency    Min 2X/week      PT Plan Current plan remains appropriate       AM-PAC PT "6 Clicks" Mobility   Outcome Measure  Help needed turning from your back to your side while in a flat bed without using bedrails?: A Little Help needed moving from lying on your back to sitting on the side of a flat bed  without using bedrails?: A Little Help needed moving to and from a bed to a chair (including a wheelchair)?: A Little Help needed standing up from a chair using your arms (e.g., wheelchair or bedside chair)?: A Little Help needed  to walk in hospital room?: A Little Help needed climbing 3-5 steps with a railing? : A Little 6 Click Score: 18    End of Session   Activity Tolerance: Patient tolerated treatment well Patient left: Other (comment) (In shower with RN/RN tech aware) Nurse Communication: Mobility status;Other (comment) PT Visit Diagnosis: Muscle weakness (generalized) (M62.81);Difficulty in walking, not elsewhere classified (R26.2)     Time: 1315-1330 PT Time Calculation (min) (ACUTE ONLY): 15 min  Charges:  $Gait Training: 8-22 mins                     Julaine Fusi PTA 12/15/19, 1:39 PM

## 2019-12-15 NOTE — Progress Notes (Signed)
Nutrition Follow-up  DOCUMENTATION CODES:   Obesity unspecified  INTERVENTION:  Patient continues to decline all nutrition interventions.  Continue Rena-vit po QHS.  NUTRITION DIAGNOSIS:   Increased nutrient needs related to chronic illness (cirrhosis, CHF) as evidenced by estimated needs.  Ongoing.  GOAL:   Patient will meet greater than or equal to 90% of their needs  Progressing.  MONITOR:   PO intake, Supplement acceptance, Labs, Weight trends, Skin, I & O's  REASON FOR ASSESSMENT:   LOS    ASSESSMENT:   64 y.o. 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  Met with patient at bedside. She reports her appetite continues to be good and she is still eating 100% of her meals. She reports her oatmeal was only lukewarm this morning. Patient continues to decline oral nutrition supplements and other interventions offered by RD.  Medications reviewed and include: Cipro, Epogen 20000 units during HD, Rena-vit, Protonix, Miralax, torsemide.  Labs reviewed: BUN 59, Creatinine 2.81.  I/O: 2 occurrences unmeasured UOP yesterday  Weights continue to fluctuate significantly in chart; pt currently documented to be 104.8 kg (231 lbs)  Diet Order:   Diet Order            Diet Heart Room service appropriate? Yes; Fluid consistency: Thin; Fluid restriction: 1500 mL Fluid  Diet effective now                EDUCATION NEEDS:   Education needs have been addressed  Skin:  Skin Assessment: Reviewed RN Assessment (non-pressure injury L leg)  Last BM:  12/15/2019 - large type 6  Height:   Ht Readings from Last 1 Encounters:  11/13/19 $RemoveB'5\' 6"'[id number]$  (1.676 m)   Weight:   Wt Readings from Last 1 Encounters:  12/12/19 104.8 kg   Ideal Body  Weight:  59 kg  BMI:  Body mass index is 37.28 kg/m.  Estimated Nutritional Needs:   Kcal:  1900-2200kcal/day  Protein:  95-110g/day  Fluid:  1.8-2L/day  Jacklynn Barnacle, MS, RD, LDN Pager number available on Amion

## 2019-12-15 NOTE — Procedures (Addendum)
Ultrasound-guided therapeutic paracentesis performed yielding 3.9 liters of yellow fluid. No immediate complications. EBL< 1cc. Loculated ascites present.

## 2019-12-16 LAB — BASIC METABOLIC PANEL
Anion gap: 10 (ref 5–15)
BUN: 61 mg/dL — ABNORMAL HIGH (ref 8–23)
CO2: 27 mmol/L (ref 22–32)
Calcium: 9.1 mg/dL (ref 8.9–10.3)
Chloride: 101 mmol/L (ref 98–111)
Creatinine, Ser: 2.73 mg/dL — ABNORMAL HIGH (ref 0.44–1.00)
GFR, Estimated: 19 mL/min — ABNORMAL LOW (ref >60)
Glucose, Bld: 84 mg/dL (ref 70–99)
Potassium: 3.9 mmol/L (ref 3.5–5.1)
Sodium: 138 mmol/L (ref 135–145)

## 2019-12-16 LAB — CBC
HCT: 28.8 % — ABNORMAL LOW (ref 36.0–46.0)
Hemoglobin: 8.7 g/dL — ABNORMAL LOW (ref 12.0–15.0)
MCH: 22.9 pg — ABNORMAL LOW (ref 26.0–34.0)
MCHC: 30.2 g/dL (ref 30.0–36.0)
MCV: 75.8 fL — ABNORMAL LOW (ref 80.0–100.0)
Platelets: 353 10*3/uL (ref 150–400)
RBC: 3.8 MIL/uL — ABNORMAL LOW (ref 3.87–5.11)
RDW: 21.2 % — ABNORMAL HIGH (ref 11.5–15.5)
WBC: 5.2 10*3/uL (ref 4.0–10.5)
nRBC: 0 % (ref 0.0–0.2)

## 2019-12-16 NOTE — TOC Progression Note (Signed)
Transition of Care [facility]) - Progression Note    Patient Details  Name: Katelyn Lane MRN: [id number] Date of Birth: [birth year]  Transition of Care [facility]) CM/SW [place], RN Phone Number: 12/16/2019, 3:34 PM  Clinical Narrative:   RNCM reached out to [name] Stabs with [place] DSS/APS. It is her plan to visit with patient on Thursday or Friday of this week and she will provide updates at that time.          Expected Discharge Plan and Services                                                 Social Determinants of Health (SDOH) Interventions    Readmission Risk Interventions Readmission Risk Prevention Plan 09/02/2019 08/14/2019 07/27/2019  Transportation Screening Complete Complete Complete  Medication Review Press photographer) Complete Referral to Pharmacy -  PCP or Specialist appointment within 3-5 days of discharge Complete Complete Complete  HRI or Home Care Consult - Complete Complete  SW Recovery Care/Counseling Consult - Complete Complete  Palliative Care Screening Not Applicable Not Applicable Not Applicable  Skilled Nursing Facility Complete Not Applicable Not Applicable  Some recent data might be hidden

## 2019-12-16 NOTE — Progress Notes (Signed)
   12/16/19 0535  Clinical Encounter Type  Visited With Patient  Visit Type Follow-up  Referral From Chaplain  Consult/Referral To Chaplain  As she walked through the unit, chaplain heard her name and looked around. As she passed by Pt's room she called chaplain. Chaplain briefly visited with Pt. Pt said, she changed her attitude. Chaplain asked if she was feeing better and she said yes. Pt told chaplain that someone brought her some new earrings and a bag of candy. She acknowledged they probably did so because she changed her attitude. Chaplain agree that Pt is more upbeat and even though she is in pain, she is more pleasant than she was several days ago. Chaplain will follow up with Pt later.

## 2019-12-16 NOTE — Progress Notes (Addendum)
Progress Note    Katelyn Lane  QMV:[id number] DOB: 1955-06-02  DOA: 10/19/2019 PCP: [name], MD      Brief Narrative:    Medical records reviewed and are as summarized below:  Katelyn Lane is a 64 y.o. female       Assessment/Plan:   Principal Problem:   Spontaneous bacterial peritonitis ([place]) Active Problems:   AKI (acute kidney injury) ([place])   Crack cocaine use   Acute respiratory failure with hypoxia ([place])   LV (left ventricular) mural thrombus   Chronic kidney disease (CKD) stage G3b/A1, moderately decreased glomerular filtration rate (GFR) between 30-44 mL/min/1.73 square meter and albuminuria creatinine ratio less than 30 mg/g (HCC)   Nonsustained ventricular tachycardia (HCC)   Acute CHF (congestive heart failure) (HCC)   Cocaine abuse ([place])   Peripheral edema   Cirrhosis of liver (HCC)   Dementia (HCC)   Aspiration pneumonia of both lower lobes due to gastric secretions (HCC)   Acute diastolic (congestive) heart failure ([place])   Cardiorenal disease   Goals of care, counseling/discussion   Palliative care by specialist   Ascites due to alcoholic cirrhosis ([place])   Nutrition Problem: Increased nutrient needs Etiology: chronic illness (cirrhosis, CHF)  Signs/Symptoms: estimated needs   Body mass index is 37.28 kg/m.  (Morbid obesity)   Liver cirrhosis with ascites complicated by SBP: s/p paracentesis x2 (11/02/2019 and 12/15/2019).  Continue torsemide for edema.  Completed IV antibiotics for SBP.  Continue ciprofloxacin for SBP prophylaxis.  Ordered repeat paracentesis she complains of abdominal swelling and pressure in the abdomen.  Acute on chronic combined systolic and diastolic CHF: echo on 2/84/13 shows EF of 25-30%, grade III diastolic dysfunction. Continue on torsemide. 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 Continue on eliquis   Overgrown toenails: will need outpatient podiatry f/u   Anemia of chronic disease: continue on iron supplements. H&H are stable  Hypokalemia: Improved  Depression/insomnia: Patient requested her trazodone be discontinued so this has been discontinued.  Vaginal Itching, hyponatremia, thrombocytosis:resolved.   Decision Making Capacity: psych does not believe the pt has the pt has the capacity to make rational decision in regards to formulating a discharge plan. No evidence of imminent risk to self or others currently as per psych  . Other comorbidities include GERD, constipation and depression, insomnia     Diet Order            Diet Heart Room service appropriate? Yes; Fluid consistency: Thin; Fluid restriction: 1500 mL Fluid  Diet effective now                    Consultants:  Psychiatrist  Vascular surgeon  Nephrologist  General surgeon  Gastroenterologist  Procedures:  Insertion of tunneled dialysis catheter through right IJ approach on 10/28/2019  Placement of right femoral vein dialysis catheter on 10/25/2019  Paracentesis with removal of 1 L of fluid on 11/02/2019  Removal of right permacath on 11/13/2019    Medications:   . apixaban  5 mg Oral BID  . ciprofloxacin  500 mg Oral Q breakfast  . epoetin (EPOGEN/PROCRIT) injection  20,000 Units Subcutaneous Weekly  . iron polysaccharides  150 mg Oral Daily  . ketotifen  1 drop Both Eyes BID  . LORazepam  1 mg Intramuscular Once  .  mouth rinse  15 mL Mouth Rinse BID  . melatonin  2.5 mg Oral QHS  . midodrine  10 mg Oral TID WC  . multivitamin  1 tablet Oral QHS  . pantoprazole  40 mg Oral BID  . polyethylene glycol  17 g Oral Daily  . torsemide  40 mg Oral Daily  . traZODone  100 mg Oral QHS   Continuous Infusions:   Anti-infectives (From admission, onward)   Start     Dose/Rate Route Frequency  Ordered Stop   11/14/19 0800  ciprofloxacin (CIPRO) tablet 500 mg        500 mg Oral Daily with breakfast 11/13/19 1938     11/01/19 1000  cefTRIAXone (ROCEPHIN) 2 g in sodium chloride 0.9 % 100 mL IVPB  Status:  Discontinued        2 g 200 mL/hr over 30 Minutes Intravenous Daily 10/31/19 1521 11/09/19 1425   10/27/19 1000  cefTRIAXone (ROCEPHIN) 2 g in sodium chloride 0.9 % 100 mL IVPB  Status:  Discontinued        2 g 200 mL/hr over 30 Minutes Intravenous Daily 10/26/19 1559 10/31/19 1521   10/26/19 2000  cefTRIAXone (ROCEPHIN) 1 g in sodium chloride 0.9 % 100 mL IVPB        1 g 200 mL/hr over 30 Minutes Intravenous  Once 10/26/19 1559 10/26/19 2024   10/26/19 1600  cefTRIAXone (ROCEPHIN) 2 g in sodium chloride 0.9 % 100 mL IVPB  Status:  Discontinued        2 g 200 mL/hr over 30 Minutes Intravenous Every 24 hours 10/26/19 1351 10/26/19 1600   10/26/19 1000  metroNIDAZOLE (FLAGYL) IVPB 500 mg  Status:  Discontinued        500 mg 100 mL/hr over 60 Minutes Intravenous Every 8 hours 10/26/19 0909 10/29/19 1138   10/25/19 1445  azithromycin (ZITHROMAX) 500 mg in sodium chloride 0.9 % 250 mL IVPB  Status:  Discontinued        500 mg 250 mL/hr over 60 Minutes Intravenous Every 24 hours 10/25/19 1353 10/29/19 1138   10/25/19 1230  azithromycin (ZITHROMAX) tablet 500 mg  Status:  Discontinued        500 mg Oral Daily 10/25/19 1133 10/25/19 1134   10/25/19 1030  cefTRIAXone (ROCEPHIN) 1 g in sodium chloride 0.9 % 100 mL IVPB  Status:  Discontinued        1 g 200 mL/hr over 30 Minutes Intravenous Every 12 hours 10/25/19 0931 10/26/19 1351             Family Communication/Anticipated D/C date and plan/Code Status   DVT prophylaxis: Place and maintain sequential compression device Start: 10/27/19 1131 apixaban (ELIQUIS) tablet 5 mg     Code Status: DNR  Family Communication: None Disposition Plan:    Status is: Inpatient  Remains inpatient appropriate because:Unsafe d/c  plan   Dispo: The patient is from: Home              Anticipated d/c is to: SNF              Anticipated d/c date is: > 3 days              Patient currently is medically stable to d/c.           Subjective:   C/o abdominal swelling and pressure in the abdomen.  She requested a trazodone to be taken off her medicine list because it does not help her.  Objective:    Vitals:   12/16/19 0515 12/16/19 0751 12/16/19 1218 12/16/19 1545  BP: 105/85 (!) 123/94 114/85 97/80  Pulse: 85 96 70 65  Resp:  20 16 18   Temp: 97.7 F (36.5 C) (!) 97.5 F (36.4 C) 97.6 F (36.4 C) 98.1 F (36.7 C)  TempSrc: Oral Oral Oral   SpO2: 100% 96% 100% 98%  Weight:      Height:       No data found.  No intake or output data in the 24 hours ending 12/16/19 1547 Filed Weights   12/04/19 0500 12/05/19 0529 12/12/19 0510  Weight: 105.9 kg 105 kg 104.8 kg    Exam:  GEN: NAD SKIN: Warm and dry EYES: No pallor or icterus ENT: MMM CV: RRR PULM: CTA B ABD: soft, distended, NT, +BS CNS: AAO x 3, non focal EXT: B/l leg edema, no tenderness   Data Reviewed:   I have personally reviewed following labs and imaging studies:  Labs: Labs show the following:   Basic Metabolic Panel: Recent Labs  Lab 12/10/19 0435 12/10/19 0435 12/13/19 0452 12/14/19 0437 12/14/19 0437 12/15/19 0507 12/16/19 0536  NA 138  --   --  137  --  138 138  K 3.4*   < >  --  3.7   < > 4.2 3.9  CL 101  --   --  101  --  101 101  CO2 27  --   --  25  --  26 27  GLUCOSE 132*  --   --  129*  --  88 84  BUN 57*  --   --  58*  --  59* 61*  CREATININE 2.74*  --  2.63* 2.74*  --  2.81* 2.73*  CALCIUM 8.9  --   --  9.2  --  9.1 9.1   < > = values in this interval not displayed.   GFR Estimated Creatinine Clearance: 25.5 mL/min (A) (by C-G formula based on SCr of 2.73 mg/dL (H)). Liver Function Tests: No results for input(s): AST, ALT, ALKPHOS, BILITOT, PROT, ALBUMIN in the last 168 hours. No results for  input(s): LIPASE, AMYLASE in the last 168 hours. No results for input(s): AMMONIA in the last 168 hours. Coagulation profile No results for input(s): INR, PROTIME in the last 168 hours.  CBC: Recent Labs  Lab 12/10/19 0435 12/13/19 0452 12/14/19 0437 12/15/19 0507 12/16/19 0536  WBC 5.6 5.3 4.9 5.7 5.2  HGB 9.0* 9.3* 9.4* 9.3* 8.7*  HCT 30.0* 31.1* 30.3* 30.5* 28.8*  MCV 76.3* 76.0* 73.9* 76.1* 75.8*  PLT 405* 407* 425* 380 353   Cardiac Enzymes: No results for input(s): CKTOTAL, CKMB, CKMBINDEX, TROPONINI in the last 168 hours. BNP (last 3 results) No results for input(s): PROBNP in the last 8760 hours. CBG: No results for input(s): GLUCAP in the last 168 hours. D-Dimer: No results for input(s): DDIMER in the last 72 hours. Hgb A1c: No results for input(s): HGBA1C in the last 72 hours. Lipid Profile: No results for input(s): CHOL, HDL, LDLCALC, TRIG, CHOLHDL, LDLDIRECT in the last 72 hours. Thyroid function studies: No results for input(s): TSH, T4TOTAL, T3FREE, THYROIDAB in the last 72 hours.  Invalid input(s): FREET3 Anemia work up: No results for input(s): VITAMINB12, FOLATE, FERRITIN, TIBC, IRON, RETICCTPCT in the last 72 hours. Sepsis Labs: Recent Labs  Lab 12/13/19 0452 12/14/19 0437 12/15/19 0507 12/16/19 0536  WBC 5.3 4.9 5.7 5.2    Microbiology No results found for this  or any previous visit (from the past 240 hour(s)).  Procedures and diagnostic studies:  US Paracentesis  Result Date: 12/15/2019 INDICATION: Patient with history of CHF, chronic kidney disease, alcoholic cirrhosis, prior SBP, recurrent loculated ascites. Request received for therapeutic paracentesis up to 4 liters. EXAM: ULTRASOUND GUIDED THERAPEUTIC PARACENTESIS MEDICATIONS: 1% lidocaine to skin and subcutaneous tissue COMPLICATIONS: None immediate. PROCEDURE: Informed written consent was obtained from the patient after a discussion of the risks, benefits and alternatives to treatment.  A timeout was performed prior to the initiation of the procedure. Initial ultrasound scanning demonstrates a moderate to large amount of loculated ascites within the right lower abdominal quadrant. The right lower abdomen was prepped and draped in the usual sterile fashion. 1% lidocaine was used for local anesthesia. Following this, a 6 Fr Safe-T-Centesis catheter was introduced. An ultrasound image was saved for documentation purposes. The paracentesis was performed. The catheter was removed and a dressing was applied. The patient tolerated the procedure well without immediate post procedural complication. FINDINGS: A total of approximately 3.9 liters of yellow fluid was removed. IMPRESSION: Successful ultrasound-guided therapeutic paracentesis yielding 3.9 liters of peritoneal fluid. Read by: Rowe Robert, PA-C Electronically Signed   By: Jacqulynn Cadet M.D.   On: 12/15/2019 13:02               LOS: 29 days   Katelyn Lane  [facility]   Pager on [email or web address]. If 7PM-7AM, please contact night-coverage at [email or web address]     12/16/2019, 3:47 PM

## 2019-12-17 ENCOUNTER — Inpatient Hospital Stay: Payer: Medicaid Other

## 2019-12-17 LAB — CBC WITH DIFFERENTIAL/PLATELET
Abs Immature Granulocytes: 0.02 10*3/uL (ref 0.00–0.07)
Basophils Absolute: 0.1 10*3/uL (ref 0.0–0.1)
Basophils Relative: 2 %
Eosinophils Absolute: 0.2 10*3/uL (ref 0.0–0.5)
Eosinophils Relative: 3 %
HCT: 33.4 % — ABNORMAL LOW (ref 36.0–46.0)
Hemoglobin: 9.8 g/dL — ABNORMAL LOW (ref 12.0–15.0)
Immature Granulocytes: 0 %
Lymphocytes Relative: 21 %
Lymphs Abs: 1.1 10*3/uL (ref 0.7–4.0)
MCH: 23 pg — ABNORMAL LOW (ref 26.0–34.0)
MCHC: 29.3 g/dL — ABNORMAL LOW (ref 30.0–36.0)
MCV: 78.2 fL — ABNORMAL LOW (ref 80.0–100.0)
Monocytes Absolute: 0.6 10*3/uL (ref 0.1–1.0)
Monocytes Relative: 13 %
Neutro Abs: 3.1 10*3/uL (ref 1.7–7.7)
Neutrophils Relative %: 61 %
Platelets: 344 10*3/uL (ref 150–400)
RBC: 4.27 MIL/uL (ref 3.87–5.11)
RDW: 21.8 % — ABNORMAL HIGH (ref 11.5–15.5)
Smear Review: NORMAL
WBC: 5.1 10*3/uL (ref 4.0–10.5)
nRBC: 0 % (ref 0.0–0.2)

## 2019-12-17 LAB — BASIC METABOLIC PANEL
Anion gap: 11 (ref 5–15)
BUN: 57 mg/dL — ABNORMAL HIGH (ref 8–23)
CO2: 26 mmol/L (ref 22–32)
Calcium: 9 mg/dL (ref 8.9–10.3)
Chloride: 100 mmol/L (ref 98–111)
Creatinine, Ser: 2.71 mg/dL — ABNORMAL HIGH (ref 0.44–1.00)
GFR, Estimated: 19 mL/min — ABNORMAL LOW (ref >60)
Glucose, Bld: 168 mg/dL — ABNORMAL HIGH (ref 70–99)
Potassium: 4.4 mmol/L (ref 3.5–5.1)
Sodium: 137 mmol/L (ref 135–145)

## 2019-12-17 MED ORDER — FUROSEMIDE 10 MG/ML IJ SOLN
40.0000 mg | Freq: Two times a day (BID) | INTRAMUSCULAR | Status: DC
  Administered 2019-12-17 – 2019-12-21 (×9): 40 mg via INTRAVENOUS
  Filled 2019-12-17 (×9): qty 4

## 2019-12-17 NOTE — Progress Notes (Addendum)
Progress Note    Katelyn Lane  UUV:[id number] DOB: December 11, 1955  DOA: 10/19/2019 PCP: [name], MD      Brief Narrative:    Medical records reviewed and are as summarized below:  Katelyn Lane is a 64 y.o. female       Assessment/Plan:   Principal Problem:   Spontaneous bacterial peritonitis ([place]) Active Problems:   AKI (acute kidney injury) ([place])   Crack cocaine use   Acute respiratory failure with hypoxia ([place])   LV (left ventricular) mural thrombus   Chronic kidney disease (CKD) stage G3b/A1, moderately decreased glomerular filtration rate (GFR) between 30-44 mL/min/1.73 square meter and albuminuria creatinine ratio less than 30 mg/g (HCC)   Nonsustained ventricular tachycardia (HCC)   Acute CHF (congestive heart failure) (HCC)   Cocaine abuse ([place])   Peripheral edema   Cirrhosis of liver (HCC)   Dementia (HCC)   Aspiration pneumonia of both lower lobes due to gastric secretions (HCC)   Acute diastolic (congestive) heart failure ([place])   Cardiorenal disease   Goals of care, counseling/discussion   Palliative care by specialist   Ascites due to alcoholic cirrhosis ([place])   Nutrition Problem: Increased nutrient needs Etiology: chronic illness (cirrhosis, CHF)  Signs/Symptoms: estimated needs   Body mass index is 39.85 kg/m.  (Morbid obesity)   Liver cirrhosis with ascites complicated by SBP: s/p paracentesis x2 (11/02/2019 and 12/15/2019).  Substitute IV Lasix for torsemide because of increasing abdominal and lower extremity swelling.  Paracentesis was attempted today.  However, this could not be completed because ultrasound only showed trace ascites and there was no large enough pocket to allow for safe diagnostic paracentesis.  Completed IV antibiotics for SBP.  Continue ciprofloxacin for SBP prophylaxis.  Acute on chronic combined systolic and diastolic CHF: echo on 4/74/25 shows EF of 25-30%, grade III diastolic dysfunction. Continue on  torsemide. Received HD x 2 but does not want HD again.  Started IV Lasix for fluid overload.  Monitor BMP and daily weight.  Acute hypoxemic respiratory failure: Resolved  Hypotension: BP is okay.continue on midodrine   AKI on CKDIIIb:  Overall, creatinine is elevated above baseline but it's stable.  Continue to monitor creatinine.   Left ventricular thrombus: w/ hx of CVA. Continue on eliquis   Overgrown toenails: will need outpatient podiatry f/u   Anemia of chronic disease: continue on iron supplements. H&H are stable  Hypokalemia: Improved  Depression/insomnia: Patient requested her trazodone be discontinued so this has been discontinued.  Vaginal Itching, hyponatremia, thrombocytosis:resolved.   Decision Making Capacity: psych does not believe the pt has the pt has the capacity to make rational decision in regards to formulating a discharge plan. No evidence of imminent risk to self or others currently as per psych  . Other comorbidities include GERD, constipation and depression, insomnia     Diet Order            Diet Heart Room service appropriate? Yes; Fluid consistency: Thin; Fluid restriction: 1500 mL Fluid  Diet effective now                    Consultants:  Psychiatrist  Vascular surgeon  Nephrologist  General surgeon  Gastroenterologist  Procedures:  Insertion of tunneled dialysis catheter through right IJ approach on 10/28/2019  Placement of right femoral vein dialysis catheter on 10/25/2019  Paracentesis with removal of 1 L of fluid on 11/02/2019  Removal of right permacath on 11/13/2019    Medications:   .  apixaban  5 mg Oral BID  . ciprofloxacin  500 mg Oral Q breakfast  . epoetin (EPOGEN/PROCRIT) injection  20,000 Units Subcutaneous Weekly  . furosemide  40 mg Intravenous BID  . iron polysaccharides  150 mg Oral Daily  . ketotifen  1 drop Both Eyes BID  . LORazepam  1 mg Intramuscular Once  . mouth rinse  15 mL  Mouth Rinse BID  . melatonin  2.5 mg Oral QHS  . midodrine  10 mg Oral TID WC  . multivitamin  1 tablet Oral QHS  . pantoprazole  40 mg Oral BID  . polyethylene glycol  17 g Oral Daily  . torsemide  40 mg Oral Daily   Continuous Infusions:   Anti-infectives (From admission, onward)   Start     Dose/Rate Route Frequency Ordered Stop   11/14/19 0800  ciprofloxacin (CIPRO) tablet 500 mg        500 mg Oral Daily with breakfast 11/13/19 1938     11/01/19 1000  cefTRIAXone (ROCEPHIN) 2 g in sodium chloride 0.9 % 100 mL IVPB  Status:  Discontinued        2 g 200 mL/hr over 30 Minutes Intravenous Daily 10/31/19 1521 11/09/19 1425   10/27/19 1000  cefTRIAXone (ROCEPHIN) 2 g in sodium chloride 0.9 % 100 mL IVPB  Status:  Discontinued        2 g 200 mL/hr over 30 Minutes Intravenous Daily 10/26/19 1559 10/31/19 1521   10/26/19 2000  cefTRIAXone (ROCEPHIN) 1 g in sodium chloride 0.9 % 100 mL IVPB        1 g 200 mL/hr over 30 Minutes Intravenous  Once 10/26/19 1559 10/26/19 2024   10/26/19 1600  cefTRIAXone (ROCEPHIN) 2 g in sodium chloride 0.9 % 100 mL IVPB  Status:  Discontinued        2 g 200 mL/hr over 30 Minutes Intravenous Every 24 hours 10/26/19 1351 10/26/19 1600   10/26/19 1000  metroNIDAZOLE (FLAGYL) IVPB 500 mg  Status:  Discontinued        500 mg 100 mL/hr over 60 Minutes Intravenous Every 8 hours 10/26/19 0909 10/29/19 1138   10/25/19 1445  azithromycin (ZITHROMAX) 500 mg in sodium chloride 0.9 % 250 mL IVPB  Status:  Discontinued        500 mg 250 mL/hr over 60 Minutes Intravenous Every 24 hours 10/25/19 1353 10/29/19 1138   10/25/19 1230  azithromycin (ZITHROMAX) tablet 500 mg  Status:  Discontinued        500 mg Oral Daily 10/25/19 1133 10/25/19 1134   10/25/19 1030  cefTRIAXone (ROCEPHIN) 1 g in sodium chloride 0.9 % 100 mL IVPB  Status:  Discontinued        1 g 200 mL/hr over 30 Minutes Intravenous Every 12 hours 10/25/19 0931 10/26/19 1351             Family  Communication/Anticipated D/C date and plan/Code Status   DVT prophylaxis: Place and maintain sequential compression device Start: 10/27/19 1131 apixaban (ELIQUIS) tablet 5 mg     Code Status: DNR  Family Communication: None Disposition Plan:    Status is: Inpatient  Remains inpatient appropriate because:Unsafe d/c plan   Dispo: The patient is from: Home              Anticipated d/c is to: SNF              Anticipated d/c date is: > 3 days  Patient currently is not medically stable to d/c.           Subjective:   Interval events noted.  She complains of worsening abdominal swelling and lower extremity swelling from the thighs to the feet.  She said the swelling in her lower extremities got worse when she sat up in the chair.   Objective:    Vitals:   12/17/19 0653 12/17/19 0716 12/17/19 1142 12/17/19 1534  BP: 104/85 115/89 (!) 119/99 (!) 111/93  Pulse: 82 78 83 97  Resp: 17 18 17 18   Temp: (!) 97.5 F (36.4 C) 97.6 F (36.4 C) 97.6 F (36.4 C)   TempSrc: Oral     SpO2: 100% 100% 93% 95%  Weight:      Height:       No data found.  No intake or output data in the 24 hours ending 12/17/19 1614 Filed Weights   12/05/19 0529 12/12/19 0510 12/17/19 0500  Weight: 105 kg 104.8 kg 112 kg    Exam:  GEN: NAD SKIN: Warm and dry EYES: EOMI ENT: MMM CV: RRR PULM: CTA B ABD: soft, abdominal swelling, NT, +BS CNS: AAO x 3, non focal EXT: Bilateral lower extremity pitting edema from the thighs to the feet.     Data Reviewed:   I have personally reviewed following labs and imaging studies:  Labs: Labs show the following:   Basic Metabolic Panel: Recent Labs  Lab 12/13/19 0452 12/14/19 0437 12/14/19 0437 12/15/19 0507 12/16/19 0536 12/17/19 1056  NA  --  137  --  138 138 137  K  --  3.7   < > 4.2 3.9 4.4  CL  --  101  --  101 101 100  CO2  --  25  --  26 27 26   GLUCOSE  --  129*  --  88 84 168*  BUN  --  58*  --  59* 61* 57*   CREATININE 2.63* 2.74*  --  2.81* 2.73* 2.71*  CALCIUM  --  9.2  --  9.1 9.1 9.0   < > = values in this interval not displayed.   GFR Estimated Creatinine Clearance: 26.6 mL/min (A) (by C-G formula based on SCr of 2.71 mg/dL (H)). Liver Function Tests: No results for input(s): AST, ALT, ALKPHOS, BILITOT, PROT, ALBUMIN in the last 168 hours. No results for input(s): LIPASE, AMYLASE in the last 168 hours. No results for input(s): AMMONIA in the last 168 hours. Coagulation profile No results for input(s): INR, PROTIME in the last 168 hours.  CBC: Recent Labs  Lab 12/13/19 0452 12/14/19 0437 12/15/19 0507 12/16/19 0536 12/17/19 1056  WBC 5.3 4.9 5.7 5.2 5.1  NEUTROABS  --   --   --   --  3.1  HGB 9.3* 9.4* 9.3* 8.7* 9.8*  HCT 31.1* 30.3* 30.5* 28.8* 33.4*  MCV 76.0* 73.9* 76.1* 75.8* 78.2*  PLT 407* 425* 380 353 344   Cardiac Enzymes: No results for input(s): CKTOTAL, CKMB, CKMBINDEX, TROPONINI in the last 168 hours. BNP (last 3 results) No results for input(s): PROBNP in the last 8760 hours. CBG: No results for input(s): GLUCAP in the last 168 hours. D-Dimer: No results for input(s): DDIMER in the last 72 hours. Hgb A1c: No results for input(s): HGBA1C in the last 72 hours. Lipid Profile: No results for input(s): CHOL, HDL, LDLCALC, TRIG, CHOLHDL, LDLDIRECT in the last 72 hours. Thyroid function studies: No results for input(s): TSH, T4TOTAL, T3FREE, THYROIDAB in the last  72 hours.  Invalid input(s): FREET3 Anemia work up: No results for input(s): VITAMINB12, FOLATE, FERRITIN, TIBC, IRON, RETICCTPCT in the last 72 hours. Sepsis Labs: Recent Labs  Lab 12/14/19 0437 12/15/19 0507 12/16/19 0536 12/17/19 1056  WBC 4.9 5.7 5.2 5.1    Microbiology No results found for this or any previous visit (from the past 240 hour(s)).  Procedures and diagnostic studies:  No results found.             LOS: 57 days   [place] Copywriter, advertising  on [email or web address]. If 7PM-7AM, please contact night-coverage at [email or web address]     12/17/2019, 4:14 PM

## 2019-12-17 NOTE — Progress Notes (Signed)
Interventional Radiology Progress Note  Ultrasound shows trace ascites and no large enough pocket to allow for safe diagnostic paracentesis today.  Katelyn Lane. Kathlene Cote, M.D Pager:  [phone]

## 2019-12-17 NOTE — TOC Progression Note (Signed)
Transition of Care [facility]) - Progression Note    Patient Details  Name: Katelyn Lane MRN: [id number] Date of Birth: [birth year]  Transition of Care [facility] CM/SW [place], RN Phone Number: 12/17/2019, 3:23 PM  Clinical Narrative:   RNCM met with [name] Stabs with DSS to discuss patient's case along with providing most recent updates from physician. Norvel Richards reported that she would meet with patient and then call this CM and inform her of any updates.          Expected Discharge Plan and Services                                                 Social Determinants of Health (SDOH) Interventions    Readmission Risk Interventions Readmission Risk Prevention Plan 09/02/2019 08/14/2019 07/27/2019  Transportation Screening Complete Complete Complete  Medication Review Press photographer) Complete Referral to Pharmacy -  PCP or Specialist appointment within 3-5 days of discharge Complete Complete Complete  HRI or Home Care Consult - Complete Complete  SW Recovery Care/Counseling Consult - Complete Complete  Palliative Care Screening Not Applicable Not Applicable Not Applicable  Skilled Nursing Facility Complete Not Applicable Not Applicable  Some recent data might be hidden

## 2019-12-17 NOTE — Progress Notes (Addendum)
Physical Therapy Treatment Patient Details Name: Katelyn Lane MRN: [id number] DOB: February 05, 1955 Today's Date: 12/17/2019    History of Present Illness Pt is a 64 yo female who presented to ER secondary to LE edema, progressive SOB; admitted for management of acute/chronic CHF, spontaneous bacterial peritonitis (s/p paracentesis 10/25).  Hospital course also significant for initiation of dialysis (initially, R temp fem cath; converted to R permcath).  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    PT Comments    Pt alert, seated in normal chair in room upon PT entrance, eager for OOB mobility. Pt seen as a re-evaluation. Pt denied pain, but then did report L flank pain once during session. The patient was able to perform sit <> stand modI with RW. She also ambulated at least 279ft with RW, modI. Pt does tend to keep RW forward outside BOS, but no unsteadiness noted, no LOB. Pt with mild complaints of SOB after ambulation, spO2 >95%. Pt demonstrated mobility safely with PT, and per pt she has been up in her room without assist, to include showering. Due to the patient's improvement in mobility, PT to sign off. Nursing educated about continued importance of mobility including ambulation in hallway during her hospital stay to maintain mobility status.      Follow Up Recommendations  No PT follow up     Equipment Recommendations  Rolling walker with 5" wheels;3in1 (PT)    Recommendations for Other Services       Precautions / Restrictions Restrictions Weight Bearing Restrictions: No Other Position/Activity  Restrictions: Impulsive, self directs care    Mobility  Bed Mobility               General bed mobility comments: pt out of bed and seated in normal chair in room at start of session  Transfers Overall transfer level: Modified independent Equipment used: Rolling walker (2 wheeled);None Transfers: Sit to/from Stand Sit to Stand: Modified independent (Device/Increase time)         General transfer comment: pt wanted to chair without RW ~3 ft without RW; able to use single UE support to do so, no unsteadiness noted  Ambulation/Gait Ambulation/Gait assistance: Supervision Gait Distance (Feet): 250 Feet Assistive device: Rolling walker (2 wheeled) Gait Pattern/deviations: Step-through pattern;Trunk flexed Gait velocity: WNL   General Gait Details: tends to ambulate with RW outside BOS but no physical assist needed, no LOB   Stairs             Wheelchair Mobility    Modified Rankin (Stroke Patients Only)       Balance Overall balance assessment: Needs assistance Sitting-balance support: Feet supported;No upper extremity supported Sitting balance-Leahy Scale: Good     Standing balance support: No upper extremity supported;During functional activity Standing balance-Leahy Scale: Good                              Cognition Arousal/Alertness: Awake/alert Behavior During Therapy: Impulsive Overall Cognitive Status: Within Functional Limits for tasks assessed                                 General Comments:  Pt is Alert and conversational throughout. motivated to perform OOB activity      Exercises      General Comments        Pertinent Vitals/Pain Pain Assessment: Faces Faces Pain Scale: Hurts a little bit Pain Location: L side, intermittently Pain Descriptors / Indicators: Grimacing Pain Intervention(s): Monitored during session;Repositioned    Home Living                      Prior Function             PT Goals (current goals can now be found in the care plan section) Acute Rehab PT Goals Patient Stated Goal: non stated    Frequency           PT Plan Frequency needs to be updated    Co-evaluation              AM-PAC PT "6 Clicks" Mobility   Outcome Measure  Help needed turning from your back to your side while in a flat bed without using bedrails?: None Help needed moving from lying on your back to sitting on the side of a flat bed without using bedrails?: None Help needed moving to and from a bed to a chair (including a wheelchair)?: None Help needed standing up from a chair using your arms (e.g., wheelchair or bedside chair)?: None Help needed to walk in hospital room?: None Help needed climbing 3-5 steps with a railing? : A Little 6 Click Score: 23    End of Session Equipment Utilized During Treatment: Gait belt Activity Tolerance: Patient tolerated treatment well Patient left: in chair;with call bell/phone within reach (In shower with RN/RN tech aware) Nurse Communication: Mobility status PT Visit Diagnosis: Muscle weakness (generalized) (M62.81);Difficulty in walking, not elsewhere classified (R26.2)     Time: 8329-1916 PT Time Calculation (min) (ACUTE ONLY): 13 min  Charges:  $Therapeutic Exercise: 8-22 mins                     Lieutenant Diego PT, DPT 1:31 PM,12/17/19

## 2019-12-18 LAB — BASIC METABOLIC PANEL
Anion gap: 12 (ref 5–15)
BUN: 58 mg/dL — ABNORMAL HIGH (ref 8–23)
CO2: 27 mmol/L (ref 22–32)
Calcium: 8.9 mg/dL (ref 8.9–10.3)
Chloride: 99 mmol/L (ref 98–111)
Creatinine, Ser: 2.69 mg/dL — ABNORMAL HIGH (ref 0.44–1.00)
GFR, Estimated: 19 mL/min — ABNORMAL LOW (ref >60)
Glucose, Bld: 122 mg/dL — ABNORMAL HIGH (ref 70–99)
Potassium: 3.9 mmol/L (ref 3.5–5.1)
Sodium: 138 mmol/L (ref 135–145)

## 2019-12-18 NOTE — TOC Progression Note (Addendum)
Transition of Care [facility]) - Progression Note    Patient Details  Name: Katelyn Lane MRN: [id number] Date of Birth: [birth year]  Transition of [facility] CM/SW [place], RN Phone Number: 12/18/2019, 10:50 AM  Clinical Narrative:   RNCM reached out to [facility] for follow up after meeting with patient yesterday. Per Norvel Richards "We completed the judgment questions and will continue with the guardianship evaluation."         Expected Discharge Plan and Services                                                 Social Determinants of Health (SDOH) Interventions    Readmission Risk Interventions Readmission Risk Prevention Plan 09/02/2019 08/14/2019 07/27/2019  Transportation Screening Complete Complete Complete  Medication Review Press photographer) Complete Referral to Pharmacy -  PCP or Specialist appointment within 3-5 days of discharge Complete Complete Complete  HRI or Home Care Consult - Complete Complete  SW Recovery Care/Counseling Consult - Complete Complete  Palliative Care Screening Not Applicable Not Applicable Not Applicable  Skilled Nursing Facility Complete Not Applicable Not Applicable  Some recent data might be hidden

## 2019-12-18 NOTE — Progress Notes (Signed)
Progress Note    Katelyn Lane  EHM:[id number] DOB: 1955/04/01  DOA: 10/19/2019 PCP: [name], MD      Brief Narrative:    Medical records reviewed and are as summarized below:  Katelyn Lane is a 64 y.o. female       Assessment/Plan:   Principal Problem:   Spontaneous bacterial peritonitis ([place]) Active Problems:   AKI (acute kidney injury) ([place])   Crack cocaine use   Acute respiratory failure with hypoxia ([place])   LV (left ventricular) mural thrombus   Chronic kidney disease (CKD) stage G3b/A1, moderately decreased glomerular filtration rate (GFR) between 30-44 mL/min/1.73 square meter and albuminuria creatinine ratio less than 30 mg/g (HCC)   Nonsustained ventricular tachycardia (HCC)   Acute CHF (congestive heart failure) (HCC)   Cocaine abuse ([place])   Peripheral edema   Cirrhosis of liver (HCC)   Dementia (HCC)   Aspiration pneumonia of both lower lobes due to gastric secretions (HCC)   Acute diastolic (congestive) heart failure ([place])   Cardiorenal disease   Goals of care, counseling/discussion   Palliative care by specialist   Ascites due to alcoholic cirrhosis ([place])   Nutrition Problem: Increased nutrient needs Etiology: chronic illness (cirrhosis, CHF)  Signs/Symptoms: estimated needs   Body mass index is 35.09 kg/m.  (Morbid obesity)   Liver cirrhosis with ascites complicated by SBP: s/p paracentesis x2 (11/02/2019 and 12/15/2019).  Paracentesis could not be performed on 12/17/2019 because of was not enough fluid on abdominal ultrasound for safe paracentesis.  Continue IV Lasix.  Completed IV antibiotics for SBP.  Continue ciprofloxacin for SBP prophylaxis.  Acute on chronic combined systolic and diastolic CHF: echo on 3/66/29 shows EF of 25-30%, grade III diastolic dysfunction. Continue on torsemide. Received HD x 2 but does not want HD again.  Continue IV Lasix  Acute hypoxemic respiratory failure: Resolved  Hypotension:  Borderline low BP.  Continue midodrine  AKI on CKDIIIb:  Creatinine is stable.  Continue to monitor.  Left ventricular thrombus: w/ hx of CVA. Continue on eliquis   Overgrown toenails: will need outpatient podiatry f/u   Anemia of chronic disease: continue on iron supplements. H&H are stable  Hypokalemia: Improved  Depression/insomnia: Trazodone was discontinued on 12/19/2019 per patient's request  Vaginal Itching, hyponatremia, thrombocytosis:resolved.   Decision Making Capacity: psych does not believe the pt has the pt has the capacity to make rational decision in regards to formulating a discharge plan. No evidence of imminent risk to self or others currently as per psych  . Other comorbidities include GERD, constipation and depression, insomnia     Diet Order            Diet Heart Room service appropriate? Yes; Fluid consistency: Thin; Fluid restriction: 1500 mL Fluid  Diet effective now                    Consultants:  Psychiatrist  Vascular surgeon  Nephrologist  General surgeon  Gastroenterologist  Procedures:  Insertion of tunneled dialysis catheter through right IJ approach on 10/28/2019  Placement of right femoral vein dialysis catheter on 10/25/2019  Paracentesis with removal of 1 L of fluid on 11/02/2019  Removal of right permacath on 11/13/2019    Medications:   . apixaban  5 mg Oral BID  . ciprofloxacin  500 mg Oral Q breakfast  . epoetin (EPOGEN/PROCRIT) injection  20,000 Units Subcutaneous Weekly  . furosemide  40 mg Intravenous BID  . iron polysaccharides  150 mg  Oral Daily  . ketotifen  1 drop Both Eyes BID  . LORazepam  1 mg Intramuscular Once  . mouth rinse  15 mL Mouth Rinse BID  . melatonin  2.5 mg Oral QHS  . midodrine  10 mg Oral TID WC  . multivitamin  1 tablet Oral QHS  . pantoprazole  40 mg Oral BID  . polyethylene glycol  17 g Oral Daily   Continuous Infusions:   Anti-infectives (From admission, onward)    Start     Dose/Rate Route Frequency Ordered Stop   11/14/19 0800  ciprofloxacin (CIPRO) tablet 500 mg        500 mg Oral Daily with breakfast 11/13/19 1938     11/01/19 1000  cefTRIAXone (ROCEPHIN) 2 g in sodium chloride 0.9 % 100 mL IVPB  Status:  Discontinued        2 g 200 mL/hr over 30 Minutes Intravenous Daily 10/31/19 1521 11/09/19 1425   10/27/19 1000  cefTRIAXone (ROCEPHIN) 2 g in sodium chloride 0.9 % 100 mL IVPB  Status:  Discontinued        2 g 200 mL/hr over 30 Minutes Intravenous Daily 10/26/19 1559 10/31/19 1521   10/26/19 2000  cefTRIAXone (ROCEPHIN) 1 g in sodium chloride 0.9 % 100 mL IVPB        1 g 200 mL/hr over 30 Minutes Intravenous  Once 10/26/19 1559 10/26/19 2024   10/26/19 1600  cefTRIAXone (ROCEPHIN) 2 g in sodium chloride 0.9 % 100 mL IVPB  Status:  Discontinued        2 g 200 mL/hr over 30 Minutes Intravenous Every 24 hours 10/26/19 1351 10/26/19 1600   10/26/19 1000  metroNIDAZOLE (FLAGYL) IVPB 500 mg  Status:  Discontinued        500 mg 100 mL/hr over 60 Minutes Intravenous Every 8 hours 10/26/19 0909 10/29/19 1138   10/25/19 1445  azithromycin (ZITHROMAX) 500 mg in sodium chloride 0.9 % 250 mL IVPB  Status:  Discontinued        500 mg 250 mL/hr over 60 Minutes Intravenous Every 24 hours 10/25/19 1353 10/29/19 1138   10/25/19 1230  azithromycin (ZITHROMAX) tablet 500 mg  Status:  Discontinued        500 mg Oral Daily 10/25/19 1133 10/25/19 1134   10/25/19 1030  cefTRIAXone (ROCEPHIN) 1 g in sodium chloride 0.9 % 100 mL IVPB  Status:  Discontinued        1 g 200 mL/hr over 30 Minutes Intravenous Every 12 hours 10/25/19 0931 10/26/19 1351             Family Communication/Anticipated D/C date and plan/Code Status   DVT prophylaxis: Place and maintain sequential compression device Start: 10/27/19 1131 apixaban (ELIQUIS) tablet 5 mg     Code Status: DNR  Family Communication: None Disposition Plan:    Status is: Inpatient  Remains inpatient  appropriate because:Unsafe d/c plan   Dispo: The patient is from: Home              Anticipated d/c is to: SNF              Anticipated d/c date is: > 3 days              Patient currently is not medically stable to d/c.           Subjective:   Interval events noted.  She still has swelling in the abdomen and lower extremities but she thinks swelling is only slightly  better compared to yesterday.   Objective:    Vitals:   12/18/19 0324 12/18/19 0423 12/18/19 0829 12/18/19 1151  BP:  104/79 92/76 97/68   Pulse:  67 69 81  Resp:  17 20 17   Temp:  98.3 F (36.8 C) 97.9 F (36.6 C) 98.5 F (36.9 C)  TempSrc:  Oral    SpO2:  100% 91% 100%  Weight: 98.6 kg     Height:       No data found.   Intake/Output Summary (Last 24 hours) at 12/18/2019 1503 Last data filed at 12/18/2019 1318 Gross per 24 hour  Intake 240 ml  Output 800 ml  Net -560 ml   Filed Weights   12/12/19 0510 12/17/19 0500 12/18/19 0324  Weight: 104.8 kg 112 kg 98.6 kg    Exam:  GEN: NAD SKIN: Warm and dry EYES: Anicteric ENT: MMM CV: RRR PULM: No wheezing or rales heard ABD: soft, distended, NT, +BS CNS: AAO x 3, non focal EXT: Bilateral lower extremity pitting edema from the thighs to the feet.     Data Reviewed:   I have personally reviewed following labs and imaging studies:  Labs: Labs show the following:   Basic Metabolic Panel: Recent Labs  Lab 12/14/19 0437 12/15/19 0507 12/16/19 0536 12/17/19 1056 12/18/19 0503  NA 137 138 138 137 138  K 3.7 4.2 3.9 4.4 3.9  CL 101 101 101 100 99  CO2 25 26 27 26 27   GLUCOSE 129* 88 84 168* 122*  BUN 58* 59* 61* 57* 58*  CREATININE 2.74* 2.81* 2.73* 2.71* 2.69*  CALCIUM 9.2 9.1 9.1 9.0 8.9   GFR Estimated Creatinine Clearance: 25 mL/min (A) (by C-G formula based on SCr of 2.69 mg/dL (H)). Liver Function Tests: No results for input(s): AST, ALT, ALKPHOS, BILITOT, PROT, ALBUMIN in the last 168 hours. No results for input(s):  LIPASE, AMYLASE in the last 168 hours. No results for input(s): AMMONIA in the last 168 hours. Coagulation profile No results for input(s): INR, PROTIME in the last 168 hours.  CBC: Recent Labs  Lab 12/13/19 0452 12/14/19 0437 12/15/19 0507 12/16/19 0536 12/17/19 1056  WBC 5.3 4.9 5.7 5.2 5.1  NEUTROABS  --   --   --   --  3.1  HGB 9.3* 9.4* 9.3* 8.7* 9.8*  HCT 31.1* 30.3* 30.5* 28.8* 33.4*  MCV 76.0* 73.9* 76.1* 75.8* 78.2*  PLT 407* 425* 380 353 344   Cardiac Enzymes: No results for input(s): CKTOTAL, CKMB, CKMBINDEX, TROPONINI in the last 168 hours. BNP (last 3 results) No results for input(s): PROBNP in the last 8760 hours. CBG: No results for input(s): GLUCAP in the last 168 hours. D-Dimer: No results for input(s): DDIMER in the last 72 hours. Hgb A1c: No results for input(s): HGBA1C in the last 72 hours. Lipid Profile: No results for input(s): CHOL, HDL, LDLCALC, TRIG, CHOLHDL, LDLDIRECT in the last 72 hours. Thyroid function studies: No results for input(s): TSH, T4TOTAL, T3FREE, THYROIDAB in the last 72 hours.  Invalid input(s): FREET3 Anemia work up: No results for input(s): VITAMINB12, FOLATE, FERRITIN, TIBC, IRON, RETICCTPCT in the last 72 hours. Sepsis Labs: Recent Labs  Lab 12/14/19 0437 12/15/19 0507 12/16/19 0536 12/17/19 1056  WBC 4.9 5.7 5.2 5.1    Microbiology No results found for this or any previous visit (from the past 240 hour(s)).  Procedures and diagnostic studies:  [name] ASCITES (ABDOMEN LIMITED)  Result Date: 12/17/2019 CLINICAL DATA:  Cirrhosis, ascites and status post paracentesis on  12/15/2019 with removal of 3.9 L of fluid. History of SBP in October. EXAM: LIMITED ABDOMEN ULTRASOUND FOR ASCITES TECHNIQUE: Limited ultrasound survey for ascites was performed in all four abdominal quadrants. COMPARISON:  12/15/2019 FINDINGS: Survey of the peritoneal cavity shows only trace ascites in the peritoneal cavity. A large enough pocket was not  present to allow for paracentesis today. IMPRESSION: Small volume ascites by ultrasound. A large enough pocket was not present to allow paracentesis today. Electronically Signed   By: Aletta Edouard M.D.   On: 12/17/2019 16:16               LOS: 71 days   Katelyn Lane  [facility]   Pager on [email or web address]. If 7PM-7AM, please contact night-coverage at [email or web address]     12/18/2019, 3:03 PM

## 2019-12-19 LAB — BASIC METABOLIC PANEL
Anion gap: 13 (ref 5–15)
BUN: 57 mg/dL — ABNORMAL HIGH (ref 8–23)
CO2: 27 mmol/L (ref 22–32)
Calcium: 9.1 mg/dL (ref 8.9–10.3)
Chloride: 97 mmol/L — ABNORMAL LOW (ref 98–111)
Creatinine, Ser: 2.76 mg/dL — ABNORMAL HIGH (ref 0.44–1.00)
GFR, Estimated: 19 mL/min — ABNORMAL LOW (ref >60)
Glucose, Bld: 98 mg/dL (ref 70–99)
Potassium: 3.3 mmol/L — ABNORMAL LOW (ref 3.5–5.1)
Sodium: 137 mmol/L (ref 135–145)

## 2019-12-19 MED ORDER — POTASSIUM CHLORIDE CRYS ER 20 MEQ PO TBCR
40.0000 meq | EXTENDED_RELEASE_TABLET | Freq: Two times a day (BID) | ORAL | Status: AC
  Administered 2019-12-19 (×2): 40 meq via ORAL
  Filled 2019-12-19 (×2): qty 2

## 2019-12-19 NOTE — Progress Notes (Addendum)
Progress Note    Katelyn Lane  GGE:[id number] DOB: 10-07-1955  DOA: 10/19/2019 PCP: [name], MD      Brief Narrative:    Medical records reviewed and are as summarized below:  Katelyn Lane is a 64 y.o. female       Assessment/Plan:   Principal Problem:   Spontaneous bacterial peritonitis ([place]) Active Problems:   AKI (acute kidney injury) ([place])   Crack cocaine use   Acute respiratory failure with hypoxia ([place])   LV (left ventricular) mural thrombus   Chronic kidney disease (CKD) stage G3b/A1, moderately decreased glomerular filtration rate (GFR) between 30-44 mL/min/1.73 square meter and albuminuria creatinine ratio less than 30 mg/g (HCC)   Nonsustained ventricular tachycardia (HCC)   Acute CHF (congestive heart failure) (HCC)   Cocaine abuse ([place])   Peripheral edema   Cirrhosis of liver (HCC)   Dementia (HCC)   Aspiration pneumonia of both lower lobes due to gastric secretions (HCC)   Acute diastolic (congestive) heart failure ([place])   Cardiorenal disease   Goals of care, counseling/discussion   Palliative care by specialist   Ascites due to alcoholic cirrhosis ([place])   Nutrition Problem: Increased nutrient needs Etiology: chronic illness (cirrhosis, CHF)  Signs/Symptoms: estimated needs   Body mass index is 35.09 kg/m.  (Morbid obesity)   Liver cirrhosis with ascites complicated by SBP: s/p paracentesis x2 (11/02/2019 and 12/15/2019).  Paracentesis could not be performed on 12/17/2019 because there was not enough fluid on abdominal ultrasound for safe paracentesis.  Continue IV Lasix. Completed antibiotics for SBP. Continue ciprofloxacin for SBP prophylaxis.  Acute on chronic combined systolic and diastolic CHF: echo on 4/65/03 shows EF of 25-30%, grade III diastolic dysfunction. Continue on torsemide. Received HD x 2 but does not want HD again.  Continue IV Lasix  Acute hypoxemic respiratory failure: Resolved  Hypotension:  Borderline low BP.  Continue midodrine  AKI on CKDIIIb:  Creatinine is starting to trend up again.  Monitor BMP.  Left ventricular thrombus: w/ hx of CVA.  Continue Eliquis   Overgrown toenails: will need outpatient podiatry f/u   Anemia of chronic disease: continue on iron supplements. H&H are stable  Hypokalemia: Improved  Depression/insomnia: Trazodone was discontinued on 12/17/2019 per patient's request  Vaginal Itching, hyponatremia, thrombocytosis:resolved.   Decision Making Capacity: psych does not believe the pt has the pt has the capacity to make rational decision in regards to formulating a discharge plan. No evidence of imminent risk to self or others currently as per psych  . Other comorbidities include GERD, constipation and depression, insomnia     Diet Order            Diet Heart Room service appropriate? Yes; Fluid consistency: Thin; Fluid restriction: 1500 mL Fluid  Diet effective now                    Consultants:  Psychiatrist  Vascular surgeon  Nephrologist  General surgeon  Gastroenterologist  Procedures:  Insertion of tunneled dialysis catheter through right IJ approach on 10/28/2019  Placement of right femoral vein dialysis catheter on 10/25/2019  Paracentesis with removal of 1 L of fluid on 11/02/2019  Removal of right permacath on 11/13/2019    Medications:   . apixaban  5 mg Oral BID  . ciprofloxacin  500 mg Oral Q breakfast  . epoetin (EPOGEN/PROCRIT) injection  20,000 Units Subcutaneous Weekly  . furosemide  40 mg Intravenous BID  . iron polysaccharides  150 mg  Oral Daily  . ketotifen  1 drop Both Eyes BID  . LORazepam  1 mg Intramuscular Once  . mouth rinse  15 mL Mouth Rinse BID  . melatonin  2.5 mg Oral QHS  . midodrine  10 mg Oral TID WC  . multivitamin  1 tablet Oral QHS  . pantoprazole  40 mg Oral BID  . polyethylene glycol  17 g Oral Daily  . potassium chloride  40 mEq Oral BID   Continuous  Infusions:   Anti-infectives (From admission, onward)   Start     Dose/Rate Route Frequency Ordered Stop   11/14/19 0800  ciprofloxacin (CIPRO) tablet 500 mg        500 mg Oral Daily with breakfast 11/13/19 1938     11/01/19 1000  cefTRIAXone (ROCEPHIN) 2 g in sodium chloride 0.9 % 100 mL IVPB  Status:  Discontinued        2 g 200 mL/hr over 30 Minutes Intravenous Daily 10/31/19 1521 11/09/19 1425   10/27/19 1000  cefTRIAXone (ROCEPHIN) 2 g in sodium chloride 0.9 % 100 mL IVPB  Status:  Discontinued        2 g 200 mL/hr over 30 Minutes Intravenous Daily 10/26/19 1559 10/31/19 1521   10/26/19 2000  cefTRIAXone (ROCEPHIN) 1 g in sodium chloride 0.9 % 100 mL IVPB        1 g 200 mL/hr over 30 Minutes Intravenous  Once 10/26/19 1559 10/26/19 2024   10/26/19 1600  cefTRIAXone (ROCEPHIN) 2 g in sodium chloride 0.9 % 100 mL IVPB  Status:  Discontinued        2 g 200 mL/hr over 30 Minutes Intravenous Every 24 hours 10/26/19 1351 10/26/19 1600   10/26/19 1000  metroNIDAZOLE (FLAGYL) IVPB 500 mg  Status:  Discontinued        500 mg 100 mL/hr over 60 Minutes Intravenous Every 8 hours 10/26/19 0909 10/29/19 1138   10/25/19 1445  azithromycin (ZITHROMAX) 500 mg in sodium chloride 0.9 % 250 mL IVPB  Status:  Discontinued        500 mg 250 mL/hr over 60 Minutes Intravenous Every 24 hours 10/25/19 1353 10/29/19 1138   10/25/19 1230  azithromycin (ZITHROMAX) tablet 500 mg  Status:  Discontinued        500 mg Oral Daily 10/25/19 1133 10/25/19 1134   10/25/19 1030  cefTRIAXone (ROCEPHIN) 1 g in sodium chloride 0.9 % 100 mL IVPB  Status:  Discontinued        1 g 200 mL/hr over 30 Minutes Intravenous Every 12 hours 10/25/19 0931 10/26/19 1351             Family Communication/Anticipated D/C date and plan/Code Status   DVT prophylaxis: Place and maintain sequential compression device Start: 10/27/19 1131 apixaban (ELIQUIS) tablet 5 mg     Code Status: DNR  Family Communication:  None Disposition Plan:    Status is: Inpatient  Remains inpatient appropriate because:Unsafe d/c plan   Dispo: The patient is from: Home              Anticipated d/c is to: SNF              Anticipated d/c date is: > 3 days              Patient currently is not medically stable to d/c.           Subjective:   She has not noticed any improvement in abdominal and lower extremity swelling  from yesterday.  No shortness of breath or chest pain.   Objective:    Vitals:   12/19/19 0036 12/19/19 0504 12/19/19 0831 12/19/19 1128  BP: 114/84 (!) 126/45 96/77 122/87  Pulse: 86 73 74 70  Resp: 18 17 17 18   Temp: 97.6 F (36.4 C) 97.7 F (36.5 C) 98.1 F (36.7 C) 98.6 F (37 C)  TempSrc: Oral Oral Oral   SpO2: 99% 100% 100% 100%  Weight:      Height:       No data found.   Intake/Output Summary (Last 24 hours) at 12/19/2019 1337 Last data filed at 12/19/2019 1046 Gross per 24 hour  Intake 700 ml  Output 1535 ml  Net -835 ml   Filed Weights   12/12/19 0510 12/17/19 0500 12/18/19 0324  Weight: 104.8 kg 112 kg 98.6 kg    Exam:  GEN: NAD SKIN: Warm and dry EYES: No pallor or icterus ENT: MMM CV: RRR PULM: CTA B ABD: soft, distended, NT, +BS CNS: AAO x 3, non focal EXT: Bilateral lower extremity pitting edema from bilateral thighs to bilateral feet.     Data Reviewed:   I have personally reviewed following labs and imaging studies:  Labs: Labs show the following:   Basic Metabolic Panel: Recent Labs  Lab 12/15/19 0507 12/16/19 0536 12/17/19 1056 12/18/19 0503 12/19/19 0419  NA 138 138 137 138 137  K 4.2 3.9 4.4 3.9 3.3*  CL 101 101 100 99 97*  CO2 26 27 26 27 27   GLUCOSE 88 84 168* 122* 98  BUN 59* 61* 57* 58* 57*  CREATININE 2.81* 2.73* 2.71* 2.69* 2.76*  CALCIUM 9.1 9.1 9.0 8.9 9.1   GFR Estimated Creatinine Clearance: 24.4 mL/min (A) (by C-G formula based on SCr of 2.76 mg/dL (H)). Liver Function Tests: No results for input(s):  AST, ALT, ALKPHOS, BILITOT, PROT, ALBUMIN in the last 168 hours. No results for input(s): LIPASE, AMYLASE in the last 168 hours. No results for input(s): AMMONIA in the last 168 hours. Coagulation profile No results for input(s): INR, PROTIME in the last 168 hours.  CBC: Recent Labs  Lab 12/13/19 0452 12/14/19 0437 12/15/19 0507 12/16/19 0536 12/17/19 1056  WBC 5.3 4.9 5.7 5.2 5.1  NEUTROABS  --   --   --   --  3.1  HGB 9.3* 9.4* 9.3* 8.7* 9.8*  HCT 31.1* 30.3* 30.5* 28.8* 33.4*  MCV 76.0* 73.9* 76.1* 75.8* 78.2*  PLT 407* 425* 380 353 344   Cardiac Enzymes: No results for input(s): CKTOTAL, CKMB, CKMBINDEX, TROPONINI in the last 168 hours. BNP (last 3 results) No results for input(s): PROBNP in the last 8760 hours. CBG: No results for input(s): GLUCAP in the last 168 hours. D-Dimer: No results for input(s): DDIMER in the last 72 hours. Hgb A1c: No results for input(s): HGBA1C in the last 72 hours. Lipid Profile: No results for input(s): CHOL, HDL, LDLCALC, TRIG, CHOLHDL, LDLDIRECT in the last 72 hours. Thyroid function studies: No results for input(s): TSH, T4TOTAL, T3FREE, THYROIDAB in the last 72 hours.  Invalid input(s): FREET3 Anemia work up: No results for input(s): VITAMINB12, FOLATE, FERRITIN, TIBC, IRON, RETICCTPCT in the last 72 hours. Sepsis Labs: Recent Labs  Lab 12/14/19 0437 12/15/19 0507 12/16/19 0536 12/17/19 1056  WBC 4.9 5.7 5.2 5.1    Microbiology No results found for this or any previous visit (from the past 240 hour(s)).  Procedures and diagnostic studies:  [name] ASCITES (ABDOMEN LIMITED)  Result Date: 12/17/2019 CLINICAL  DATA:  Cirrhosis, ascites and status post paracentesis on 12/15/2019 with removal of 3.9 L of fluid. History of SBP in October. EXAM: LIMITED ABDOMEN ULTRASOUND FOR ASCITES TECHNIQUE: Limited ultrasound survey for ascites was performed in all four abdominal quadrants. COMPARISON:  12/15/2019 FINDINGS: Survey of the peritoneal  cavity shows only trace ascites in the peritoneal cavity. A large enough pocket was not present to allow for paracentesis today. IMPRESSION: Small volume ascites by ultrasound. A large enough pocket was not present to allow paracentesis today. Electronically Signed   By: Aletta Edouard M.D.   On: 12/17/2019 16:16               LOS: 55 days   Katelyn Lane  [facility]   Pager on [email or web address]. If 7PM-7AM, please contact night-coverage at [email or web address]     12/19/2019, 1:37 PM

## 2019-12-20 LAB — CBC
HCT: 32.5 % — ABNORMAL LOW (ref 36.0–46.0)
Hemoglobin: 9.3 g/dL — ABNORMAL LOW (ref 12.0–15.0)
MCH: 22.3 pg — ABNORMAL LOW (ref 26.0–34.0)
MCHC: 28.6 g/dL — ABNORMAL LOW (ref 30.0–36.0)
MCV: 77.9 fL — ABNORMAL LOW (ref 80.0–100.0)
Platelets: 371 10*3/uL (ref 150–400)
RBC: 4.17 MIL/uL (ref 3.87–5.11)
RDW: 20.9 % — ABNORMAL HIGH (ref 11.5–15.5)
WBC: 4.9 10*3/uL (ref 4.0–10.5)
nRBC: 0 % (ref 0.0–0.2)

## 2019-12-20 LAB — BASIC METABOLIC PANEL
Anion gap: 9 (ref 5–15)
BUN: 58 mg/dL — ABNORMAL HIGH (ref 8–23)
CO2: 28 mmol/L (ref 22–32)
Calcium: 9.1 mg/dL (ref 8.9–10.3)
Chloride: 100 mmol/L (ref 98–111)
Creatinine, Ser: 2.63 mg/dL — ABNORMAL HIGH (ref 0.44–1.00)
GFR, Estimated: 20 mL/min — ABNORMAL LOW (ref >60)
Glucose, Bld: 90 mg/dL (ref 70–99)
Potassium: 4.3 mmol/L (ref 3.5–5.1)
Sodium: 137 mmol/L (ref 135–145)

## 2019-12-20 LAB — MAGNESIUM: Magnesium: 1.9 mg/dL (ref 1.7–2.4)

## 2019-12-20 NOTE — Progress Notes (Signed)
Katelyn Lane c/o dizziness. VS stable. RN gave her a fan to cool, and because she asked for saltines and juice, I gave it to her as she thinks both may help reduce dizziness. Breathing is even and unlabored; speech normal. Will monitor for reduction in symptoms.

## 2019-12-20 NOTE — Progress Notes (Signed)
Progress Note    Katelyn Lane  QVZ:[id number] DOB: Apr 21, 1955  DOA: 10/19/2019 PCP: [name], MD      Brief Narrative:    Medical records reviewed and are as summarized below:  Katelyn Lane is a 64 y.o. female       Assessment/Plan:   Principal Problem:   Spontaneous bacterial peritonitis ([place]) Active Problems:   AKI (acute kidney injury) ([place])   Crack cocaine use   Acute respiratory failure with hypoxia ([place])   LV (left ventricular) mural thrombus   Chronic kidney disease (CKD) stage G3b/A1, moderately decreased glomerular filtration rate (GFR) between 30-44 mL/min/1.73 square meter and albuminuria creatinine ratio less than 30 mg/g (HCC)   Nonsustained ventricular tachycardia (HCC)   Acute CHF (congestive heart failure) (HCC)   Cocaine abuse ([place])   Peripheral edema   Cirrhosis of liver (HCC)   Dementia (HCC)   Aspiration pneumonia of both lower lobes due to gastric secretions (HCC)   Acute diastolic (congestive) heart failure ([place])   Cardiorenal disease   Goals of care, counseling/discussion   Palliative care by specialist   Ascites due to alcoholic cirrhosis ([place])   Nutrition Problem: Increased nutrient needs Etiology: chronic illness (cirrhosis, CHF)  Signs/Symptoms: estimated needs   Body mass index is 36.01 kg/m.  (Morbid obesity)   Liver cirrhosis with ascites complicated by SBP: s/p paracentesis x2 (11/02/2019 and 12/15/2019).  Paracentesis could not be performed on 12/17/2019 because there was not enough fluid on abdominal ultrasound for safe paracentesis.  Continue IV Lasix.  I am reluctant to increase dose of IV Lasix because of borderline low blood pressure.  Monitor BMP.   Completed antibiotics for SBP. Continue ciprofloxacin for SBP prophylaxis.  Acute on chronic combined systolic and diastolic CHF: echo on 04/05/49 shows EF of 25-30%, grade III diastolic dysfunction. Continue on torsemide. Received HD x 2 but does not want  HD again.  Continue IV Lasix  Acute hypoxemic respiratory failure: Resolved  Hypotension: Borderline low BP.  Continue midodrine  AKI on CKDIIIb:  Creatinine is starting to trend up again.  Monitor BMP.  Left ventricular thrombus: w/ hx of CVA.  Continue Eliquis   Overgrown toenails:  Follow-up with podiatrist as an outpatient.  Anemia of chronic disease: continue on iron supplements. H&H are stable  Hypokalemia: Improved  Depression/insomnia: Trazodone was discontinued on 12/17/2019 per patient's request  Vaginal Itching, hyponatremia, thrombocytosis:resolved.   Decision Making Capacity: psych does not believe the pt has the pt has the capacity to make rational decision in regards to formulating a discharge plan. No evidence of imminent risk to self or others currently as per psych  . Other comorbidities include GERD, constipation and depression, insomnia     Diet Order            Diet Heart Room service appropriate? Yes; Fluid consistency: Thin; Fluid restriction: 1500 mL Fluid  Diet effective now                    Consultants:  Psychiatrist  Vascular surgeon  Nephrologist  General surgeon  Gastroenterologist  Procedures:  Insertion of tunneled dialysis catheter through right IJ approach on 10/28/2019  Placement of right femoral vein dialysis catheter on 10/25/2019  Paracentesis with removal of 1 L of fluid on 11/02/2019  Removal of right permacath on 11/13/2019    Medications:   . apixaban  5 mg Oral BID  . ciprofloxacin  500 mg Oral Q breakfast  . epoetin (  EPOGEN/PROCRIT) injection  20,000 Units Subcutaneous Weekly  . furosemide  40 mg Intravenous BID  . iron polysaccharides  150 mg Oral Daily  . ketotifen  1 drop Both Eyes BID  . LORazepam  1 mg Intramuscular Once  . mouth rinse  15 mL Mouth Rinse BID  . melatonin  2.5 mg Oral QHS  . midodrine  10 mg Oral TID WC  . multivitamin  1 tablet Oral QHS  . pantoprazole  40 mg Oral  BID  . polyethylene glycol  17 g Oral Daily   Continuous Infusions:   Anti-infectives (From admission, onward)   Start     Dose/Rate Route Frequency Ordered Stop   11/14/19 0800  ciprofloxacin (CIPRO) tablet 500 mg        500 mg Oral Daily with breakfast 11/13/19 1938     11/01/19 1000  cefTRIAXone (ROCEPHIN) 2 g in sodium chloride 0.9 % 100 mL IVPB  Status:  Discontinued        2 g 200 mL/hr over 30 Minutes Intravenous Daily 10/31/19 1521 11/09/19 1425   10/27/19 1000  cefTRIAXone (ROCEPHIN) 2 g in sodium chloride 0.9 % 100 mL IVPB  Status:  Discontinued        2 g 200 mL/hr over 30 Minutes Intravenous Daily 10/26/19 1559 10/31/19 1521   10/26/19 2000  cefTRIAXone (ROCEPHIN) 1 g in sodium chloride 0.9 % 100 mL IVPB        1 g 200 mL/hr over 30 Minutes Intravenous  Once 10/26/19 1559 10/26/19 2024   10/26/19 1600  cefTRIAXone (ROCEPHIN) 2 g in sodium chloride 0.9 % 100 mL IVPB  Status:  Discontinued        2 g 200 mL/hr over 30 Minutes Intravenous Every 24 hours 10/26/19 1351 10/26/19 1600   10/26/19 1000  metroNIDAZOLE (FLAGYL) IVPB 500 mg  Status:  Discontinued        500 mg 100 mL/hr over 60 Minutes Intravenous Every 8 hours 10/26/19 0909 10/29/19 1138   10/25/19 1445  azithromycin (ZITHROMAX) 500 mg in sodium chloride 0.9 % 250 mL IVPB  Status:  Discontinued        500 mg 250 mL/hr over 60 Minutes Intravenous Every 24 hours 10/25/19 1353 10/29/19 1138   10/25/19 1230  azithromycin (ZITHROMAX) tablet 500 mg  Status:  Discontinued        500 mg Oral Daily 10/25/19 1133 10/25/19 1134   10/25/19 1030  cefTRIAXone (ROCEPHIN) 1 g in sodium chloride 0.9 % 100 mL IVPB  Status:  Discontinued        1 g 200 mL/hr over 30 Minutes Intravenous Every 12 hours 10/25/19 0931 10/26/19 1351             Family Communication/Anticipated D/C date and plan/Code Status   DVT prophylaxis: Place and maintain sequential compression device Start: 10/27/19 1131 apixaban (ELIQUIS) tablet 5 mg      Code Status: DNR  Family Communication: None Disposition Plan:    Status is: Inpatient  Remains inpatient appropriate because:Unsafe d/c plan   Dispo: The patient is from: Home              Anticipated d/c is to: SNF              Anticipated d/c date is: > 3 days              Patient currently is not medically stable to d/c.           Subjective:  C/o overgrown toenails, abdominal pain and leg swelling.  She is frustrated by the lack of improvement in body swelling.   Objective:    Vitals:   12/19/19 2341 12/20/19 0355 12/20/19 0500 12/20/19 0815  BP: 96/77 110/88  101/71  Pulse: 77 76  76  Resp: 18 18  18   Temp: 97.8 F (36.6 C) 98.1 F (36.7 C)  97.6 F (36.4 C)  TempSrc: Oral Oral  Oral  SpO2: 100% 100%  100%  Weight:   101.2 kg   Height:       No data found.   Intake/Output Summary (Last 24 hours) at 12/20/2019 1144 Last data filed at 12/20/2019 1040 Gross per 24 hour  Intake 120 ml  Output 1600 ml  Net -1480 ml   Filed Weights   12/17/19 0500 12/18/19 0324 12/20/19 0500  Weight: 112 kg 98.6 kg 101.2 kg    Exam:  GEN: NAD SKIN: Warm and dry EYES: No pallor or icterus ENT: MMM CV: RRR PULM: CTA B ABD: soft, distended/obese, NT, +BS CNS: AAO x 3, non focal EXT: Significant bilateral lower extremity pitting edema from the thighs to the feet.  Overgrown toenails     Data Reviewed:   I have personally reviewed following labs and imaging studies:  Labs: Labs show the following:   Basic Metabolic Panel: Recent Labs  Lab 12/16/19 0536 12/17/19 1056 12/18/19 0503 12/19/19 0419 12/20/19 0510  NA 138 137 138 137 137  K 3.9 4.4 3.9 3.3* 4.3  CL 101 100 99 97* 100  CO2 27 26 27 27 28   GLUCOSE 84 168* 122* 98 90  BUN 61* 57* 58* 57* 58*  CREATININE 2.73* 2.71* 2.69* 2.76* 2.63*  CALCIUM 9.1 9.0 8.9 9.1 9.1  MG  --   --   --   --  1.9   GFR Estimated Creatinine Clearance: 26 mL/min (A) (by C-G formula based on SCr of  2.63 mg/dL (H)). Liver Function Tests: No results for input(s): AST, ALT, ALKPHOS, BILITOT, PROT, ALBUMIN in the last 168 hours. No results for input(s): LIPASE, AMYLASE in the last 168 hours. No results for input(s): AMMONIA in the last 168 hours. Coagulation profile No results for input(s): INR, PROTIME in the last 168 hours.  CBC: Recent Labs  Lab 12/14/19 0437 12/15/19 0507 12/16/19 0536 12/17/19 1056 12/20/19 0510  WBC 4.9 5.7 5.2 5.1 4.9  NEUTROABS  --   --   --  3.1  --   HGB 9.4* 9.3* 8.7* 9.8* 9.3*  HCT 30.3* 30.5* 28.8* 33.4* 32.5*  MCV 73.9* 76.1* 75.8* 78.2* 77.9*  PLT 425* 380 353 344 371   Cardiac Enzymes: No results for input(s): CKTOTAL, CKMB, CKMBINDEX, TROPONINI in the last 168 hours. BNP (last 3 results) No results for input(s): PROBNP in the last 8760 hours. CBG: No results for input(s): GLUCAP in the last 168 hours. D-Dimer: No results for input(s): DDIMER in the last 72 hours. Hgb A1c: No results for input(s): HGBA1C in the last 72 hours. Lipid Profile: No results for input(s): CHOL, HDL, LDLCALC, TRIG, CHOLHDL, LDLDIRECT in the last 72 hours. Thyroid function studies: No results for input(s): TSH, T4TOTAL, T3FREE, THYROIDAB in the last 72 hours.  Invalid input(s): FREET3 Anemia work up: No results for input(s): VITAMINB12, FOLATE, FERRITIN, TIBC, IRON, RETICCTPCT in the last 72 hours. Sepsis Labs: Recent Labs  Lab 12/15/19 0507 12/16/19 0536 12/17/19 1056 12/20/19 0510  WBC 5.7 5.2 5.1 4.9    Microbiology No results found  for this or any previous visit (from the past 240 hour(s)).  Procedures and diagnostic studies:  No results found.             LOS: 19 days   [place] Copy[employer], advertising on [email or web address]. If 7PM-7AM, please contact night-coverage at [email or web address]     12/20/2019, 11:44 AM

## 2019-12-21 LAB — BASIC METABOLIC PANEL
Anion gap: 11 (ref 5–15)
BUN: 60 mg/dL — ABNORMAL HIGH (ref 8–23)
CO2: 27 mmol/L (ref 22–32)
Calcium: 9.2 mg/dL (ref 8.9–10.3)
Chloride: 99 mmol/L (ref 98–111)
Creatinine, Ser: 2.76 mg/dL — ABNORMAL HIGH (ref 0.44–1.00)
GFR, Estimated: 19 mL/min — ABNORMAL LOW (ref >60)
Glucose, Bld: 88 mg/dL (ref 70–99)
Potassium: 4.2 mmol/L (ref 3.5–5.1)
Sodium: 137 mmol/L (ref 135–145)

## 2019-12-21 LAB — MAGNESIUM: Magnesium: 1.9 mg/dL (ref 1.7–2.4)

## 2019-12-21 MED ORDER — FUROSEMIDE 10 MG/ML IJ SOLN
60.0000 mg | Freq: Two times a day (BID) | INTRAMUSCULAR | Status: DC
  Administered 2019-12-21 – 2019-12-24 (×6): 60 mg via INTRAVENOUS
  Filled 2019-12-21 (×6): qty 8

## 2019-12-21 NOTE — Progress Notes (Signed)
Progress Note    Katelyn Lane  YQM:[id number] DOB: Apr 01, 1955  DOA: 10/19/2019 PCP: [name], MD      Brief Narrative:    Medical records reviewed and are as summarized below:  Katelyn Lane is a 64 y.o. female       Assessment/Plan:   Principal Problem:   Spontaneous bacterial peritonitis ([place]) Active Problems:   AKI (acute kidney injury) ([place])   Crack cocaine use   Acute respiratory failure with hypoxia ([place])   LV (left ventricular) mural thrombus   Chronic kidney disease (CKD) stage G3b/A1, moderately decreased glomerular filtration rate (GFR) between 30-44 mL/min/1.73 square meter and albuminuria creatinine ratio less than 30 mg/g (HCC)   Nonsustained ventricular tachycardia (HCC)   Acute CHF (congestive heart failure) (HCC)   Cocaine abuse ([place])   Peripheral edema   Cirrhosis of liver (HCC)   Dementia (HCC)   Aspiration pneumonia of both lower lobes due to gastric secretions (HCC)   Acute diastolic (congestive) heart failure ([place])   Cardiorenal disease   Goals of care, counseling/discussion   Palliative care by specialist   Ascites due to alcoholic cirrhosis ([place])   Nutrition Problem: Increased nutrient needs Etiology: chronic illness (cirrhosis, CHF)  Signs/Symptoms: estimated needs   Body mass index is 35.48 kg/m.  (Morbid obesity)   Liver cirrhosis with ascites complicated by SBP: s/p paracentesis x2 (11/02/2019 and 12/15/2019).  Paracentesis could not be performed on 12/17/2019 because there was not enough fluid on abdominal ultrasound for safe paracentesis.  Increase IV Lasix from 40 mg twice daily to 60 mg because of persistent anasarca. Monitor BMP.   Completed antibiotics for SBP. Continue ciprofloxacin for SBP prophylaxis.  Acute on chronic combined systolic and diastolic CHF: echo on 06/05/39 shows EF of 25-30%, grade III diastolic dysfunction. Continue on torsemide. Received HD x 2 but does not want HD again.  Increase IV  Lasix and monitor BP closely  Acute hypoxemic respiratory failure: Resolved  Hypotension: Borderline low BP.  Continue midodrine  AKI on CKDIIIb:  Creatinine is starting to trend up again.  Monitor BMP.  Left ventricular thrombus: w/ hx of CVA.  Continue Eliquis   Overgrown toenails:  Follow-up with podiatrist as an outpatient.  Anemia of chronic disease: continue on iron supplements. H&H are stable  Hypokalemia: Improved  Depression/insomnia: Trazodone was discontinued on 12/17/2019 per patient's request  Vaginal Itching, hyponatremia, thrombocytosis:resolved.   Decision Making Capacity: psych does not believe the pt has the pt has the capacity to make rational decision in regards to formulating a discharge plan. No evidence of imminent risk to self or others currently as per psych  . Other comorbidities include GERD, constipation and depression, insomnia     Diet Order            Diet Heart Room service appropriate? Yes; Fluid consistency: Thin; Fluid restriction: 1500 mL Fluid  Diet effective now                    Consultants:  Psychiatrist  Vascular surgeon  Nephrologist  General surgeon  Gastroenterologist  Procedures:  Insertion of tunneled dialysis catheter through right IJ approach on 10/28/2019  Placement of right femoral vein dialysis catheter on 10/25/2019  Paracentesis with removal of 1 L of fluid on 11/02/2019  Removal of right permacath on 11/13/2019    Medications:    apixaban  5 mg Oral BID   ciprofloxacin  500 mg Oral Q breakfast   epoetin (EPOGEN/PROCRIT)  injection  20,000 Units Subcutaneous Weekly   furosemide  60 mg Intravenous BID   iron polysaccharides  150 mg Oral Daily   ketotifen  1 drop Both Eyes BID   LORazepam  1 mg Intramuscular Once   mouth rinse  15 mL Mouth Rinse BID   melatonin  2.5 mg Oral QHS   midodrine  10 mg Oral TID WC   multivitamin  1 tablet Oral QHS   pantoprazole  40 mg Oral  BID   polyethylene glycol  17 g Oral Daily   Continuous Infusions:   Anti-infectives (From admission, onward)   Start     Dose/Rate Route Frequency Ordered Stop   11/14/19 0800  ciprofloxacin (CIPRO) tablet 500 mg        500 mg Oral Daily with breakfast 11/13/19 1938     11/01/19 1000  cefTRIAXone (ROCEPHIN) 2 g in sodium chloride 0.9 % 100 mL IVPB  Status:  Discontinued        2 g 200 mL/hr over 30 Minutes Intravenous Daily 10/31/19 1521 11/09/19 1425   10/27/19 1000  cefTRIAXone (ROCEPHIN) 2 g in sodium chloride 0.9 % 100 mL IVPB  Status:  Discontinued        2 g 200 mL/hr over 30 Minutes Intravenous Daily 10/26/19 1559 10/31/19 1521   10/26/19 2000  cefTRIAXone (ROCEPHIN) 1 g in sodium chloride 0.9 % 100 mL IVPB        1 g 200 mL/hr over 30 Minutes Intravenous  Once 10/26/19 1559 10/26/19 2024   10/26/19 1600  cefTRIAXone (ROCEPHIN) 2 g in sodium chloride 0.9 % 100 mL IVPB  Status:  Discontinued        2 g 200 mL/hr over 30 Minutes Intravenous Every 24 hours 10/26/19 1351 10/26/19 1600   10/26/19 1000  metroNIDAZOLE (FLAGYL) IVPB 500 mg  Status:  Discontinued        500 mg 100 mL/hr over 60 Minutes Intravenous Every 8 hours 10/26/19 0909 10/29/19 1138   10/25/19 1445  azithromycin (ZITHROMAX) 500 mg in sodium chloride 0.9 % 250 mL IVPB  Status:  Discontinued        500 mg 250 mL/hr over 60 Minutes Intravenous Every 24 hours 10/25/19 1353 10/29/19 1138   10/25/19 1230  azithromycin (ZITHROMAX) tablet 500 mg  Status:  Discontinued        500 mg Oral Daily 10/25/19 1133 10/25/19 1134   10/25/19 1030  cefTRIAXone (ROCEPHIN) 1 g in sodium chloride 0.9 % 100 mL IVPB  Status:  Discontinued        1 g 200 mL/hr over 30 Minutes Intravenous Every 12 hours 10/25/19 0931 10/26/19 1351             Family Communication/Anticipated D/C date and plan/Code Status   DVT prophylaxis: Place and maintain sequential compression device Start: 10/27/19 1131 apixaban (ELIQUIS) tablet 5 mg      Code Status: DNR  Family Communication: None Disposition Plan:    Status is: Inpatient  Remains inpatient appropriate because:Unsafe d/c plan   Dispo: The patient is from: Home              Anticipated d/c is to: SNF              Anticipated d/c date is: > 3 days              Patient currently is not medically stable to d/c.           Subjective:  C/o abdominal and leg swelling   Objective:    Vitals:   12/21/19 0440 12/21/19 0740 12/21/19 0742 12/21/19 1139  BP: 125/88  (!) 123/99 (!) 113/91  Pulse: 72  80 70  Resp: 18  16 18   Temp: 97.8 F (36.6 C)  (!) 97.5 F (36.4 C) 97.8 F (36.6 C)  TempSrc: Oral  Oral Oral  SpO2: 100%  100% 100%  Weight:  99.7 kg    Height:       No data found.   Intake/Output Summary (Last 24 hours) at 12/21/2019 1403 Last data filed at 12/21/2019 1030 Gross per 24 hour  Intake 480 ml  Output 1000 ml  Net -520 ml   Filed Weights   12/18/19 0324 12/20/19 0500 12/21/19 0740  Weight: 98.6 kg 101.2 kg 99.7 kg    Exam:  GEN: NAD SKIN: Warm and dry EYES: Anicteric ENT: MMM CV: RRR PULM: CTA B ABD: soft, doistended, NT, +BS CNS: AAO x 3, non focal EXT: B/l lower extremity edema extending from the thighs to the feet. No tenderness.  Overgrown toenails        Data Reviewed:   I have personally reviewed following labs and imaging studies:  Labs: Labs show the following:   Basic Metabolic Panel: Recent Labs  Lab 12/17/19 1056 12/18/19 0503 12/19/19 0419 12/20/19 0510 12/21/19 0437  NA 137 138 137 137 137  K 4.4 3.9 3.3* 4.3 4.2  CL 100 99 97* 100 99  CO2 26 27 27 28 27   GLUCOSE 168* 122* 98 90 88  BUN 57* 58* 57* 58* 60*  CREATININE 2.71* 2.69* 2.76* 2.63* 2.76*  CALCIUM 9.0 8.9 9.1 9.1 9.2  MG  --   --   --  1.9 1.9   GFR Estimated Creatinine Clearance: 24.5 mL/min (A) (by C-G formula based on SCr of 2.76 mg/dL (H)). Liver Function Tests: No results for input(s): AST, ALT, ALKPHOS,  BILITOT, PROT, ALBUMIN in the last 168 hours. No results for input(s): LIPASE, AMYLASE in the last 168 hours. No results for input(s): AMMONIA in the last 168 hours. Coagulation profile No results for input(s): INR, PROTIME in the last 168 hours.  CBC: Recent Labs  Lab 12/15/19 0507 12/16/19 0536 12/17/19 1056 12/20/19 0510  WBC 5.7 5.2 5.1 4.9  NEUTROABS  --   --  3.1  --   HGB 9.3* 8.7* 9.8* 9.3*  HCT 30.5* 28.8* 33.4* 32.5*  MCV 76.1* 75.8* 78.2* 77.9*  PLT 380 353 344 371   Cardiac Enzymes: No results for input(s): CKTOTAL, CKMB, CKMBINDEX, TROPONINI in the last 168 hours. BNP (last 3 results) No results for input(s): PROBNP in the last 8760 hours. CBG: No results for input(s): GLUCAP in the last 168 hours. D-Dimer: No results for input(s): DDIMER in the last 72 hours. Hgb A1c: No results for input(s): HGBA1C in the last 72 hours. Lipid Profile: No results for input(s): CHOL, HDL, LDLCALC, TRIG, CHOLHDL, LDLDIRECT in the last 72 hours. Thyroid function studies: No results for input(s): TSH, T4TOTAL, T3FREE, THYROIDAB in the last 72 hours.  Invalid input(s): FREET3 Anemia work up: No results for input(s): VITAMINB12, FOLATE, FERRITIN, TIBC, IRON, RETICCTPCT in the last 72 hours. Sepsis Labs: Recent Labs  Lab 12/15/19 0507 12/16/19 0536 12/17/19 1056 12/20/19 0510  WBC 5.7 5.2 5.1 4.9    Microbiology No results found for this or any previous visit (from the past 240 hour(s)).  Procedures and diagnostic studies:  No results found.  LOS: 74 days   [place] Copywriter, advertising on [email or web address]. If 7PM-7AM, please contact night-coverage at [email or web address]     12/21/2019, 2:03 PM

## 2019-12-22 LAB — BASIC METABOLIC PANEL
Anion gap: 11 (ref 5–15)
BUN: 57 mg/dL — ABNORMAL HIGH (ref 8–23)
CO2: 28 mmol/L (ref 22–32)
Calcium: 9.6 mg/dL (ref 8.9–10.3)
Chloride: 101 mmol/L (ref 98–111)
Creatinine, Ser: 2.87 mg/dL — ABNORMAL HIGH (ref 0.44–1.00)
GFR, Estimated: 18 mL/min — ABNORMAL LOW (ref >60)
Glucose, Bld: 109 mg/dL — ABNORMAL HIGH (ref 70–99)
Potassium: 3.8 mmol/L (ref 3.5–5.1)
Sodium: 140 mmol/L (ref 135–145)

## 2019-12-22 LAB — MAGNESIUM: Magnesium: 2 mg/dL (ref 1.7–2.4)

## 2019-12-22 MED ORDER — SPIRONOLACTONE 25 MG PO TABS
25.0000 mg | ORAL_TABLET | Freq: Every day | ORAL | Status: DC
  Administered 2019-12-22 – 2020-01-29 (×39): 25 mg via ORAL
  Filled 2019-12-22 (×39): qty 1

## 2019-12-22 NOTE — TOC Progression Note (Signed)
Transition of Care [facility]) - Progression Note    Patient Details  Name: Katelyn Lane MRN: [id number] Date of Birth: 08-08-1955  Transition of Care [facility]) CM/SW [place], RN Phone Number: 12/22/2019, 8:38 AM  Clinical Narrative:   RNCM reached out to Aida Raider with [facility]. Per Norvel Richards "We are completing a guardianship evaluation. The evaluation process can take up to 60 days. At this moment I cannot say whether guardianship will be pursued because we are still evaluating."          Expected Discharge Plan and Services                                                 Social Determinants of Health (SDOH) Interventions    Readmission Risk Interventions Readmission Risk Prevention Plan 09/02/2019 08/14/2019 07/27/2019  Transportation Screening Complete Complete Complete  Medication Review Press photographer) Complete Referral to Pharmacy -  PCP or Specialist appointment within 3-5 days of discharge Complete Complete Complete  HRI or Home Care Consult - Complete Complete  SW Recovery Care/Counseling Consult - Complete Complete  Palliative Care Screening Not Applicable Not Applicable Not Applicable  Skilled Nursing Facility Complete Not Applicable Not Applicable  Some recent data might be hidden

## 2019-12-22 NOTE — Progress Notes (Signed)
Nutrition Follow-up  DOCUMENTATION CODES:   Obesity unspecified  INTERVENTION:  Patient continues to decline all nutrition interventions.  Continue Rena-vit po QHS.  NUTRITION DIAGNOSIS:   Increased nutrient needs related to chronic illness (cirrhosis, CHF) as evidenced by estimated needs.  Ongoing.  GOAL:   Patient will meet greater than or equal to 90% of their needs  Progressing.  MONITOR:   PO intake,Supplement acceptance,Labs,Weight trends,Skin,I & O's  REASON FOR ASSESSMENT:   LOS    ASSESSMENT:   64 y.o. 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  Patient reports her appetite remains good. She is eating 95-100% of her meals. Patient s/p paracentesis on 12/7 that yielded 3.9 liters of fluid. She reports her edema is starting to return. Once again thoroughly discussed patient's increased nutrient needs, especially for protein. Patient continues to decline oral nutrition supplements and other interventions offered by RD (snacks, double protein, etc.).  Medications reviewed and include: Lasix 60 mg BID IV, Rena-vit QHS, Protonix, Miralax, spironolactone 25 mg daily.  Labs reviewed: BUN 57, Creatinine 2.87.  I/O: 1300 mL UOP yesterday (0.5 mL/kg/hr)  Weights continue to fluctuate significantly in chart; pt currently documented to be 99.8 kg (220.02 lbs)  Diet Order:   Diet Order            Diet Heart Room service appropriate? Yes; Fluid consistency: Thin; Fluid restriction: 1500 mL Fluid  Diet effective now                EDUCATION NEEDS:   Education needs have been addressed  Skin:  Skin Assessment: Skin Integrity Issues: Skin Integrity Issues:: Incisions Incisions: closed incision lower abdomen  Last BM:   12/22/2019 - small type 6  Height:   Ht Readings from Last 1 Encounters:  11/13/19 5\' 6"  (1.676 m)   Weight:   Wt Readings from Last 1 Encounters:  12/22/19 99.8 kg   Ideal Body Weight:  59 kg  BMI:  Body mass index is 35.51 kg/m.  Estimated Nutritional Needs:   Kcal:  1900-2200kcal/day  Protein:  95-110g/day  Fluid:  1.8-2L/day  Jacklynn Barnacle, MS, RD, LDN Pager number available on Amion

## 2019-12-22 NOTE — Progress Notes (Signed)
[facility]  ROUNDING NOTE   Subjective:   Patient in no acute distress, reports her legs are getting swollen again, denies worsening SOB.Patient is on room air when we examined, but has nasal cannula with 2L O2 standby. Appetite is fair, no reported nausea or vomiting.    Objective:  Vital signs in last 24 hours:  Temp:  [97.5 F (36.4 C)-98.1 F (36.7 C)] 97.5 F (36.4 C) (12/14 1151) Pulse Rate:  [76-94] 94 (12/14 1151) Resp:  [16-18] 16 (12/14 1151) BP: (116-129)/(94-111) 116/94 (12/14 1151) SpO2:  [96 %-100 %] 100 % (12/14 1151) Weight:  [99.8 kg] 99.8 kg (12/14 0400)  Weight change:  Filed Weights   12/20/19 0500 12/21/19 0740 12/22/19 0400  Weight: 101.2 kg 99.7 kg 99.8 kg    Intake/Output: I/O last 3 completed shifts: In: 720 [P.O.:720] Out: 1800 [Urine:1800]   Intake/Output this shift:  Total I/O In: 120 [P.O.:120] Out: 300 [Urine:300]  Physical Exam: General:  Sitting up in bed,pleasant and very interactive  Head  Normocephalic,atraumatic  Lungs:   Respirations symmetrical, unlabored, Lungs diminished at the bases  Heart: Regular rate and rhythm  Abdomen:   Non distended, nontender  Extremities:  2+peripheral edema  Neurologic:  Awake, alert, answers simple questions appropriately  Skin: No acute lesions or rashes    Basic Metabolic Panel: Recent Labs  Lab 12/18/19 0503 12/19/19 0419 12/20/19 0510 12/21/19 0437 12/22/19 0412  NA 138 137 137 137 140  K 3.9 3.3* 4.3 4.2 3.8  CL 99 97* 100 99 101  CO2 27 27 28 27 28   GLUCOSE 122* 98 90 88 109*  BUN 58* 57* 58* 60* 57*  CREATININE 2.69* 2.76* 2.63* 2.76* 2.87*  CALCIUM 8.9 9.1 9.1 9.2 9.6  MG  --   --  1.9 1.9 2.0    Liver Function Tests: No results for input(s): AST, ALT, ALKPHOS, BILITOT, PROT, ALBUMIN in the last 168 hours. No results for input(s): LIPASE, AMYLASE in the last 168 hours. No results for input(s): AMMONIA in the last 168 hours.  CBC: Recent Labs  Lab  12/16/19 0536 12/17/19 1056 12/20/19 0510  WBC 5.2 5.1 4.9  NEUTROABS  --  3.1  --   HGB 8.7* 9.8* 9.3*  HCT 28.8* 33.4* 32.5*  MCV 75.8* 78.2* 77.9*  PLT 353 344 371    Cardiac Enzymes: No results for input(s): CKTOTAL, CKMB, CKMBINDEX, TROPONINI in the last 168 hours.  BNP: Invalid input(s): POCBNP  CBG: No results for input(s): GLUCAP in the last 168 hours.  Microbiology: Results for orders placed or performed during the hospital encounter of 10/19/19  Respiratory Panel by RT PCR (Flu A&B, Covid) - Nasopharyngeal Swab     Status: None   Collection Time: 10/19/19 11:13 PM   Specimen: Nasopharyngeal Swab  Result Value Ref Range Status   SARS Coronavirus 2 by RT PCR NEGATIVE NEGATIVE Final    Comment: (NOTE) SARS-CoV-2 target nucleic acids are NOT DETECTED.  The SARS-CoV-2 RNA is generally detectable in upper respiratoy specimens during the acute phase of infection. The lowest concentration of SARS-CoV-2 viral copies this assay can detect is 131 copies/mL. A negative result does not preclude SARS-Cov-2 infection and should not be used as the sole basis for treatment or other patient management decisions. A negative result may occur with  improper specimen collection/handling, submission of specimen other than nasopharyngeal swab, presence of viral mutation(s) within the areas targeted by this assay, and inadequate number of viral copies (<131 copies/mL). A  negative result must be combined with clinical observations, patient history, and epidemiological information. The expected result is Negative.  Fact Sheet for Patients:  PinkCheek.be  Fact Sheet for Healthcare Providers:  GravelBags.it  This test is no t yet approved or cleared by the [place] FDA and  has been authorized for detection and/or diagnosis of SARS-CoV-2 by FDA under an Emergency Use Authorization (EUA). This EUA will remain  in effect  (meaning this test can be used) for the duration of the COVID-19 declaration under Section 564(b)(1) of the Act, 21 U.S.C. section 360bbb-3(b)(1), unless the authorization is terminated or revoked sooner.     Influenza A by PCR NEGATIVE NEGATIVE Final   Influenza B by PCR NEGATIVE NEGATIVE Final    Comment: (NOTE) The Xpert Xpress SARS-CoV-2/FLU/RSV assay is intended as an aid in  the diagnosis of influenza from Nasopharyngeal swab specimens and  should not be used as a sole basis for treatment. Nasal washings and  aspirates are unacceptable for Xpert Xpress SARS-CoV-2/FLU/RSV  testing.  Fact Sheet for Patients: PinkCheek.be  Fact Sheet for Healthcare Providers: GravelBags.it  This test is not yet approved or cleared by the [place] FDA and  has been authorized for detection and/or diagnosis of SARS-CoV-2 by  FDA under an Emergency Use Authorization (EUA). This EUA will remain  in effect (meaning this test can be used) for the duration of the  Covid-19 declaration under Section 564(b)(1) of the Act, 21  U.S.C. section 360bbb-3(b)(1), unless the authorization is  terminated or revoked. Performed at [facility], [address] [place], [place] [address]   MRSA PCR Screening     Status: None   Collection Time: 10/25/19  2:50 PM   Specimen: Nasopharyngeal  Result Value Ref Range Status   MRSA by PCR NEGATIVE NEGATIVE Final    Comment:        The GeneXpert MRSA Assay (FDA approved for NASAL specimens only), is one component of a comprehensive MRSA colonization surveillance program. It is not intended to diagnose MRSA infection nor to guide or monitor treatment for MRSA infections. Performed at [facility], [address] [place], [place] [address]   CULTURE, BLOOD (ROUTINE X 2) w Reflex to ID Panel     Status: None   Collection Time: 10/25/19  4:29 PM   Specimen: BLOOD  Result Value Ref  Range Status   Specimen Description BLOOD CENTRAL LINE  Final   Special Requests   Final    BOTTLES DRAWN AEROBIC AND ANAEROBIC Blood Culture adequate volume   Culture   Final    NO GROWTH 5 DAYS Performed at [facility], [address] [place], [place] [address]    Report Status 10/30/2019 FINAL  Final  CULTURE, BLOOD (ROUTINE X 2) w Reflex to ID Panel     Status: None   Collection Time: 10/26/19  6:45 AM   Specimen: BLOOD  Result Value Ref Range Status   Specimen Description BLOOD BLOOD LEFT HAND  Final   Special Requests   Final    BOTTLES DRAWN AEROBIC AND ANAEROBIC Blood Culture adequate volume   Culture   Final    NO GROWTH 5 DAYS Performed at [facility], [address] [place], [place] [address]    Report Status 10/31/2019 FINAL  Final  Body fluid culture     Status: None   Collection Time: 10/26/19 11:30 AM   Specimen: Fluid  Result Value Ref Range Status   Specimen Description  Final    FLUID Performed at [facility], [address] [place], [place] [address]    Special Requests   Final    PERITONEAL Performed at [facility], [address] [place], [place] [address]    Gram Stain   Final    ABUNDANT WBC PRESENT, PREDOMINANTLY PMN NO ORGANISMS SEEN    Culture   Final    NO GROWTH Performed at [facility], [place] [address], [place], [place] [address]    Report Status 10/30/2019 FINAL  Final  Body fluid culture     Status: None   Collection Time: 10/29/19  1:39 PM   Specimen: PATH Cytology Peritoneal fluid  Result Value Ref Range Status   Specimen Description   Final    PERITONEAL Performed at [facility], [address], [place], [place] [address]    Special Requests   Final    NONE Performed at [facility], [address] [place], [place] [address]    Gram Stain   Final    MODERATE WBC PRESENT, PREDOMINANTLY PMN NO ORGANISMS SEEN    Culture   Final    NO GROWTH 3  DAYS Performed at [facility], [place] [address], [place], [place] [address]    Report Status 11/02/2019 FINAL  Final  Gram stain     Status: None   Collection Time: 11/02/19  3:30 PM   Specimen: PATH Cytology Peritoneal fluid  Result Value Ref Range Status   Specimen Description PERITONEAL  Final   Special Requests NONE  Final   Gram Stain   Final    WBC SEEN RED BLOOD CELLS NO ORGANISMS SEEN Performed at [facility], [address] [place],  [address]    Report Status 11/02/2019 FINAL  Final    Coagulation Studies: No results for input(s): LABPROT, INR in the last 72 hours.  Urinalysis: No results for input(s): COLORURINE, LABSPEC, PHURINE, GLUCOSEU, HGBUR, BILIRUBINUR, KETONESUR, PROTEINUR, UROBILINOGEN, NITRITE, LEUKOCYTESUR in the last 72 hours.  Invalid input(s): APPERANCEUR    Imaging: No results found.   Medications:    . apixaban  5 mg Oral BID  . ciprofloxacin  500 mg Oral Q breakfast  . epoetin (EPOGEN/PROCRIT) injection  20,000 Units Subcutaneous Weekly  . furosemide  60 mg Intravenous BID  . iron polysaccharides  150 mg Oral Daily  . ketotifen  1 drop Both Eyes BID  . LORazepam  1 mg Intramuscular Once  . mouth rinse  15 mL Mouth Rinse BID  . melatonin  2.5 mg Oral QHS  . midodrine  10 mg Oral TID WC  . multivitamin  1 tablet Oral QHS  . pantoprazole  40 mg Oral BID  . polyethylene glycol  17 g Oral Daily  . spironolactone  25 mg Oral Daily   acetaminophen, albuterol, alum & mag hydroxide-simeth **AND** lidocaine, bisacodyl, calcium carbonate, hydrocortisone cream, hydrOXYzine, loperamide, ondansetron (ZOFRAN) IV, oxyCODONE-acetaminophen  Assessment/ Plan:  [name] is a 64 y.o. black female with congestive heart failure, hypertension, hyperlipidemia, gout, COPD, anemia, cocaine abuse who is admitted to [facility] on 10/19/2019 for Peripheral edema [R60.9] Acute CHF (congestive heart failure) ([place]) [I50.9] AKI (acute kidney  injury) ([place]) [N17.9] Congestive heart failure, unspecified HF chronicity, unspecified heart failure type ([place]) [O84.1] Acute diastolic (congestive) heart failure ([place]) [I50.31]  1. Acute kidney injury on chronic kidney disease stage IIIB.     Lab Results  Component Value Date   CREATININE 2.87 (  H) 12/22/2019   CREATININE 2.76 (H) 12/21/2019   CREATININE 2.63 (H) 12/20/2019  Creatinine trending up slightly Peripheral edema worse Furosemide dose increased yesterday We will add Spironolactone  2. Hypotension with acute exacerbation of systolic and diastolic congestive heart failure. Echocardiogram 06/22/19 EF of 25-30%. Continues to be on Midodrine 10 mg TID Blood pressure readings within acceptable range  3. Anemia of Chronic Disease Lab Results  Component Value Date   HGB 9.3 (L) 12/20/2019  Will continue to monitor   LOS: 64 Coda Filler 12/14/202112:32 PM

## 2019-12-22 NOTE — Progress Notes (Signed)
Progress Note    Katelyn Lane  OMV:[id number] DOB: 09/02/1955  DOA: 10/19/2019 PCP: [name], MD      Brief Narrative:    Medical records reviewed and are as summarized below:  Katelyn Lane is a 64 y.o. 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  After admission to the hospital, she was diagnosed with acute on chronic combinedsystolic and diastolic congestive heart failurewith ejection fraction 25 to 30%. She was not responding to IV Lasix. She was seen by nephrology, temporary dialysis catheter was performed and she was dialyzed on 10/17 and 10/18.Patient also complaining of abdominal pain, she had a paracentesis, study showed possible spontaneous peritonitis, she was treated with Rocephin and Flagyl.  Patient had a worsening respiratory status on 10/17, was diagnosed with aspiration pneumonia. She was treated with Rocephin, Zithromax and Flagyl.Patient also had abdominal pain and a CT scan suspect ischemia, but seen by general surgery on 10/19, no need for surgery.  Patient was evaluated by psychiatry and she does not have decision-making capacity. Family does not want to participate in making decisions for her. DSS case was filed for guardianship and still pending. Difficult disposition due to unsafe discharge.    Assessment/Plan:   Principal Problem:   Spontaneous bacterial peritonitis ([place]) Active Problems:   AKI (acute kidney injury) ([place])   Crack cocaine use   Acute respiratory failure with hypoxia (HCC)   LV (left ventricular) mural thrombus   Chronic kidney disease (CKD) stage G3b/A1, moderately decreased glomerular filtration rate (GFR)  between 30-44 mL/min/1.73 square meter and albuminuria creatinine ratio less than 30 mg/g (HCC)   Nonsustained ventricular tachycardia (HCC)   Acute CHF (congestive heart failure) (HCC)   Cocaine abuse ([place])   Peripheral edema   Cirrhosis of liver (HCC)   Dementia (HCC)   Aspiration pneumonia of both lower lobes due to gastric secretions (HCC)   Acute diastolic (congestive) heart failure ([place])   Cardiorenal disease   Goals of care, counseling/discussion   Palliative care by specialist   Ascites due to alcoholic cirrhosis ([place])   Nutrition Problem: Increased nutrient needs Etiology: chronic illness (cirrhosis, CHF)  Signs/Symptoms: estimated needs   Body mass index is 35.51 kg/m.  (Morbid obesity)   Liver cirrhosis with ascites complicated by SBP: s/p paracentesis x2 (11/02/2019 and 12/15/2019).  Paracentesis could not be performed on 12/17/2019 because there was not enough fluid on abdominal ultrasound for safe paracentesis.  Continue IV Lasix.  Consult nephrologist to assist with fluid management.  Monitor BMP.   Completed antibiotics for SBP. Continue ciprofloxacin for SBP prophylaxis.  Acute on chronic combined systolic and diastolic CHF: echo on 07/06/34 shows EF of 25-30%, grade III diastolic dysfunction. Continue on torsemide. Received HD x 2 but does not want HD again.  Increase IV Lasix and monitor BP closely  Acute hypoxemic respiratory failure: Resolved  Hypotension: Borderline low BP.  Continue midodrine  AKI on CKDIIIb:  Creatinine is slowly trending up.  Consulted nephrologist to assist with refractory anasarca.  She may need hemodialysis.  Left ventricular thrombus: w/ hx of CVA.  Continue Eliquis   Overgrown toenails:  Follow-up with podiatrist as an outpatient.  Anemia of chronic disease: continue on iron supplements. H&H are stable  Hypokalemia:  Improved  Depression/insomnia: Trazodone was discontinued on 12/17/2019 per patient's request  Vaginal  Itching, hyponatremia, thrombocytosis:resolved.   Decision Making Capacity: psych does not believe the pt has the pt has the capacity to make rational decision in regards to formulating a discharge plan. No evidence of imminent risk to self or others currently as per psych  . Other comorbidities include GERD, constipation and depression, insomnia     Diet Order            Diet Heart Room service appropriate? Yes; Fluid consistency: Thin; Fluid restriction: 1500 mL Fluid  Diet effective now                    Consultants:  Psychiatrist  Vascular surgeon  Nephrologist  General surgeon  Gastroenterologist  Procedures:  Insertion of tunneled dialysis catheter through right IJ approach on 10/28/2019  Placement of right femoral vein dialysis catheter on 10/25/2019  Paracentesis with removal of 1 L of fluid on 11/02/2019  Removal of right permacath on 11/13/2019    Medications:   . apixaban  5 mg Oral BID  . ciprofloxacin  500 mg Oral Q breakfast  . epoetin (EPOGEN/PROCRIT) injection  20,000 Units Subcutaneous Weekly  . furosemide  60 mg Intravenous BID  . iron polysaccharides  150 mg Oral Daily  . ketotifen  1 drop Both Eyes BID  . LORazepam  1 mg Intramuscular Once  . mouth rinse  15 mL Mouth Rinse BID  . melatonin  2.5 mg Oral QHS  . midodrine  10 mg Oral TID WC  . multivitamin  1 tablet Oral QHS  . pantoprazole  40 mg Oral BID  . polyethylene glycol  17 g Oral Daily  . spironolactone  25 mg Oral Daily   Continuous Infusions:   Anti-infectives (From admission, onward)   Start     Dose/Rate Route Frequency Ordered Stop   11/14/19 0800  ciprofloxacin (CIPRO) tablet 500 mg        500 mg Oral Daily with breakfast 11/13/19 1938     11/01/19 1000  cefTRIAXone (ROCEPHIN) 2 g in sodium chloride 0.9 % 100 mL IVPB  Status:  Discontinued        2 g 200 mL/hr over 30 Minutes Intravenous Daily 10/31/19 1521 11/09/19 1425   10/27/19 1000  cefTRIAXone  (ROCEPHIN) 2 g in sodium chloride 0.9 % 100 mL IVPB  Status:  Discontinued        2 g 200 mL/hr over 30 Minutes Intravenous Daily 10/26/19 1559 10/31/19 1521   10/26/19 2000  cefTRIAXone (ROCEPHIN) 1 g in sodium chloride 0.9 % 100 mL IVPB        1 g 200 mL/hr over 30 Minutes Intravenous  Once 10/26/19 1559 10/26/19 2024   10/26/19 1600  cefTRIAXone (ROCEPHIN) 2 g in sodium chloride 0.9 % 100 mL IVPB  Status:  Discontinued        2 g 200 mL/hr over 30 Minutes Intravenous Every 24 hours 10/26/19 1351 10/26/19 1600   10/26/19 1000  metroNIDAZOLE (FLAGYL) IVPB 500 mg  Status:  Discontinued        500 mg 100 mL/hr over 60 Minutes Intravenous Every 8 hours 10/26/19 0909 10/29/19 1138   10/25/19 1445  azithromycin (ZITHROMAX) 500 mg in sodium chloride 0.9 % 250 mL IVPB  Status:  Discontinued        500 mg 250 mL/hr over 60 Minutes Intravenous Every 24 hours 10/25/19 1353 10/29/19 1138   10/25/19 1230  azithromycin (ZITHROMAX) tablet 500 mg  Status:  Discontinued        500 mg Oral Daily 10/25/19 1133 10/25/19 1134   10/25/19 1030  cefTRIAXone (ROCEPHIN) 1 g in sodium chloride 0.9 % 100 mL IVPB  Status:  Discontinued        1 g 200 mL/hr over 30 Minutes Intravenous Every 12 hours 10/25/19 0931 10/26/19 1351             Family Communication/Anticipated D/C date and plan/Code Status   DVT prophylaxis: Place and maintain sequential compression device Start: 10/27/19 1131 apixaban (ELIQUIS) tablet 5 mg     Code Status: DNR  Family Communication: None Disposition Plan:    Status is: Inpatient  Remains inpatient appropriate because:Unsafe d/c plan   Dispo: The patient is from: Home              Anticipated d/c is to: SNF              Anticipated d/c date is: > 3 days              Patient currently is not medically stable to d/c.           Subjective:   C/o abdominal swelling and leg swelling.  She said the swelling is causing discomfort and pain in her  ankles.   Objective:    Vitals:   12/22/19 0400 12/22/19 0409 12/22/19 0813 12/22/19 1151  BP:  (!) 129/111 (!) 122/102 (!) 116/94  Pulse:  87 91 94  Resp:  17 18 16   Temp:  98.1 F (36.7 C) 97.7 F (36.5 C) (!) 97.5 F (36.4 C)  TempSrc:   Oral   SpO2:  96% 100% 100%  Weight: 99.8 kg     Height:       No data found.   Intake/Output Summary (Last 24 hours) at 12/22/2019 1339 Last data filed at 12/22/2019 1200 Gross per 24 hour  Intake 360 ml  Output 1300 ml  Net -940 ml   Filed Weights   12/20/19 0500 12/21/19 0740 12/22/19 0400  Weight: 101.2 kg 99.7 kg 99.8 kg    Exam:  GEN: NAD SKIN: No rash EYES: No pallor or icterus ENT: MMM CV: RRR PULM: CTA B ABD: soft, distended, NT, +BS CNS: AAO x 3, non focal EXT: B/l lower extremity edema from the thighs to the feet. Overgrown toenails         Data Reviewed:   I have personally reviewed following labs and imaging studies:  Labs: Labs show the following:   Basic Metabolic Panel: Recent Labs  Lab 12/18/19 0503 12/19/19 0419 12/20/19 0510 12/21/19 0437 12/22/19 0412  NA 138 137 137 137 140  K 3.9 3.3* 4.3 4.2 3.8  CL 99 97* 100 99 101  CO2 27 27 28 27 28   GLUCOSE 122* 98 90 88 109*  BUN 58* 57* 58* 60* 57*  CREATININE 2.69* 2.76* 2.63* 2.76* 2.87*  CALCIUM 8.9 9.1 9.1 9.2 9.6  MG  --   --  1.9 1.9 2.0   GFR Estimated Creatinine Clearance: 23.6 mL/min (A) (by C-G formula based on SCr of 2.87 mg/dL (H)). Liver Function Tests: No results for input(s): AST, ALT, ALKPHOS, BILITOT, PROT, ALBUMIN in the last 168 hours. No results for input(s): LIPASE, AMYLASE in the last 168 hours. No results for input(s): AMMONIA in the last 168 hours. Coagulation profile No results for input(s): INR, PROTIME in the last 168 hours.  CBC: Recent Labs  Lab 12/16/19 0536 12/17/19 1056 12/20/19 0510  WBC 5.2 5.1 4.9  NEUTROABS  --  3.1  --   HGB 8.7* 9.8* 9.3*  HCT 28.8* 33.4* 32.5*  MCV 75.8* 78.2* 77.9*   PLT 353 344 371   Cardiac Enzymes: No results for input(s): CKTOTAL, CKMB, CKMBINDEX, TROPONINI in the last 168 hours. BNP (last 3 results) No results for input(s): PROBNP in the last 8760 hours. CBG: No results for input(s): GLUCAP in the last 168 hours. D-Dimer: No results for input(s): DDIMER in the last 72 hours. Hgb A1c: No results for input(s): HGBA1C in the last 72 hours. Lipid Profile: No results for input(s): CHOL, HDL, LDLCALC, TRIG, CHOLHDL, LDLDIRECT in the last 72 hours. Thyroid function studies: No results for input(s): TSH, T4TOTAL, T3FREE, THYROIDAB in the last 72 hours.  Invalid input(s): FREET3 Anemia work up: No results for input(s): VITAMINB12, FOLATE, FERRITIN, TIBC, IRON, RETICCTPCT in the last 72 hours. Sepsis Labs: Recent Labs  Lab 12/16/19 0536 12/17/19 1056 12/20/19 0510  WBC 5.2 5.1 4.9    Microbiology No results found for this or any previous visit (from the past 240 hour(s)).  Procedures and diagnostic studies:  No results found.             LOS: 19 days   [place] Copy[employer], advertising on [email or web address]. If 7PM-7AM, please contact night-coverage at [email or web address]     12/22/2019, 1:39 PM

## 2019-12-23 LAB — CBC
HCT: 31.6 % — ABNORMAL LOW (ref 36.0–46.0)
Hemoglobin: 9.5 g/dL — ABNORMAL LOW (ref 12.0–15.0)
MCH: 22.2 pg — ABNORMAL LOW (ref 26.0–34.0)
MCHC: 30.1 g/dL (ref 30.0–36.0)
MCV: 74 fL — ABNORMAL LOW (ref 80.0–100.0)
Platelets: 376 10*3/uL (ref 150–400)
RBC: 4.27 MIL/uL (ref 3.87–5.11)
RDW: 20.5 % — ABNORMAL HIGH (ref 11.5–15.5)
WBC: 5.1 10*3/uL (ref 4.0–10.5)
nRBC: 0 % (ref 0.0–0.2)

## 2019-12-23 NOTE — Progress Notes (Signed)
°   12/23/19 1400  Clinical Encounter Type  Visited With Patient  Visit Type Follow-up  Referral From Chaplain  Consult/Referral To Chaplain  Chaplain stopped by to visit with Pt. When she arrived at the room, Pt was totally under the cover. She told chaplain she is cold. Before chaplain left Pt asked for a Bible (one with large letters). Pt also said she needs glasses 2.5. Chaplain will try to find Pt a pair of reading glasses.

## 2019-12-23 NOTE — Progress Notes (Signed)
PROGRESS NOTE    Katelyn Lane  MKL:[id number] DOB: [birth year] DOA: 10/19/2019 PCP: [name], MD   Brief Narrative: Katelyn Lane is a 64 y.o. 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 After admission to the hospital, she was diagnosed with acute on chronic combinedsystolic and diastolic congestive heart failurewith ejection fraction 25 to 30%. She was not responding to IV Lasix. She was seen by nephrology, temporary dialysis catheter was performed and she was dialyzed on 10/17 and 10/18.Patient also complaining of abdominal pain, she had a paracentesis, study showed possible spontaneous peritonitis, she was treated with Rocephin and Flagyl. Patient had a worsening respiratory status on 10/17, was diagnosed with aspiration pneumonia. She was treated with Rocephin, Zithromax and Flagyl.Patient also had abdominal pain and a CT scan suspect ischemia, but seen by general surgery on 10/19, no need for surgery. Patient was evaluated by psychiatry and she does not have decision-making capacity. Family does not want to participate in making decisions for her. DSS case was filed for guardianship and still pending. Difficult disposition due to unsafe discharge. Nephrology team is also following.  She has been started on IV Lasix and spironolactone for volume overload.  Assessment & Plan:   Principal Problem:   Spontaneous bacterial peritonitis ([place]) Active Problems:   AKI (acute kidney injury) ([place])   Crack cocaine use   Acute respiratory failure with hypoxia (HCC)   LV (left ventricular) mural thrombus   Chronic kidney disease (CKD) stage G3b/A1, moderately decreased  glomerular filtration rate (GFR) between 30-44 mL/min/1.73 square meter and albuminuria creatinine ratio less than 30 mg/g (HCC)   Nonsustained ventricular tachycardia (HCC)   Acute CHF (congestive heart failure) (HCC)   Cocaine abuse ([place])   Peripheral edema   Cirrhosis of liver (HCC)   Dementia (HCC)   Aspiration pneumonia of both lower lobes due to gastric secretions (HCC)   Acute diastolic (congestive) heart failure ([place])   Cardiorenal disease   Goals of care, counseling/discussion   Palliative care by specialist   Ascites due to alcoholic cirrhosis ([place])   Liver cirrhosis with ascites complicated by SBP: s/p paracentesis x2 (11/02/2019 and 12/15/2019).  Paracentesis could not be performed on 12/17/2019 because there was not enough fluid on abdominal ultrasound for safe paracentesis.  Continue IV Lasix.  Monitor BMP.   Completed antibiotics for SBP. Continue ciprofloxacin for SBP prophylaxis.  Acute on chronic combined systolic and diastolic CHF: echo on 6/97/94 shows EF of 25-30%, grade III diastolic dysfunction. . Received HD x 2 but does not want HD again.    She looks extremely volume overloaded and has peripheral edema.  Continue Lasix 60 mg IV twice a day and spironolactone.  Nephrology also following   Acute hypoxemic respiratory failure: Requiring 2 L of oxygen pulmonary.  Lungs are clear on auscultation.  This is most likely secondary to congestive heart failure  Hypotension: Borderline low BP.  Continue midodrine  AKI on CKDIIIb: Creatinine is slowly trending up.  Nephrology following.  Continue diuresis  Left ventricular thrombus:w/ hx of CVA.  Continue Eliquis  Overgrown toenails: Follow-up with podiatrist as an outpatient.  Anemia of chronic disease:continue on iron supplements.H&H are stable  Hypokalemia: Being monitored and supplemented  Depression/insomnia: Trazodone was discontinued on 12/17/2019 per patient's  request  Vaginal Itching,  hyponatremia, thrombocytosis:resolved.  Decision Making Capacity: psych does not believe the pt has the pt has the capacity to make rational decision in regards to formulating a discharge plan. No evidence of imminent risk to self or others currently as per psych   Other comorbidities include GERD, constipation and depression, insomnia  Nutrition Problem: Increased nutrient needs Etiology: chronic illness (cirrhosis, CHF)      DVT prophylaxis:Eliquis Code Status: DNR Family Communication: None at the bedside Status is: Inpatient  Remains inpatient appropriate because:Unsafe d/c plan   Dispo: The patient is from: Home              Anticipated d/c is to: SNF              Anticipated d/c date is: > 3 days              Patient currently is not medically stable to d/c.   consultants: Nephrology  Procedures:None  Antimicrobials:  Anti-infectives (From admission, onward)   Start     Dose/Rate Route Frequency Ordered Stop   11/14/19 0800  ciprofloxacin (CIPRO) tablet 500 mg        500 mg Oral Daily with breakfast 11/13/19 1938     11/01/19 1000  cefTRIAXone (ROCEPHIN) 2 g in sodium chloride 0.9 % 100 mL IVPB  Status:  Discontinued        2 g 200 mL/hr over 30 Minutes Intravenous Daily 10/31/19 1521 11/09/19 1425   10/27/19 1000  cefTRIAXone (ROCEPHIN) 2 g in sodium chloride 0.9 % 100 mL IVPB  Status:  Discontinued        2 g 200 mL/hr over 30 Minutes Intravenous Daily 10/26/19 1559 10/31/19 1521   10/26/19 2000  cefTRIAXone (ROCEPHIN) 1 g in sodium chloride 0.9 % 100 mL IVPB        1 g 200 mL/hr over 30 Minutes Intravenous  Once 10/26/19 1559 10/26/19 2024   10/26/19 1600  cefTRIAXone (ROCEPHIN) 2 g in sodium chloride 0.9 % 100 mL IVPB  Status:  Discontinued        2 g 200 mL/hr over 30 Minutes Intravenous Every 24 hours 10/26/19 1351 10/26/19 1600   10/26/19 1000  metroNIDAZOLE (FLAGYL) IVPB 500 mg  Status:  Discontinued        500 mg 100 mL/hr over 60 Minutes Intravenous  Every 8 hours 10/26/19 0909 10/29/19 1138   10/25/19 1445  azithromycin (ZITHROMAX) 500 mg in sodium chloride 0.9 % 250 mL IVPB  Status:  Discontinued        500 mg 250 mL/hr over 60 Minutes Intravenous Every 24 hours 10/25/19 1353 10/29/19 1138   10/25/19 1230  azithromycin (ZITHROMAX) tablet 500 mg  Status:  Discontinued        500 mg Oral Daily 10/25/19 1133 10/25/19 1134   10/25/19 1030  cefTRIAXone (ROCEPHIN) 1 g in sodium chloride 0.9 % 100 mL IVPB  Status:  Discontinued        1 g 200 mL/hr over 30 Minutes Intravenous Every 12 hours 10/25/19 0931 10/26/19 1351      Subjective:  Patient seen and examined at the bedside this morning.  Hemodynamically stable.  On 2 L of oxygen per minute.  Denies any shortness of breath.  Has significant peripheral edema  Objective: Vitals:   12/22/19 2317 12/23/19 0523 12/23/19 0814 12/23/19 1126  BP: (!) 131/94 (!) 118/97 116/87 (!) 112/93  Pulse: 89 80 84 75  Resp:  18 19 20  Temp: 97.8 F (36.6 C) 98 F (36.7 C) 98.7 F (37.1 C) 97.9 F (36.6 C)  TempSrc: Oral Oral Oral   SpO2: 100% 100% 93% 100%  Weight:      Height:        Intake/Output Summary (Last 24 hours) at 12/23/2019 1355 Last data filed at 12/23/2019 0100 Gross per 24 hour  Intake --  Output 620 ml  Net -620 ml   Filed Weights   12/20/19 0500 12/21/19 0740 12/22/19 0400  Weight: 101.2 kg 99.7 kg 99.8 kg    Examination:  General exam: Overall comfortable, not in any kind of distress Respiratory system: Bilateral equal air entry, normal vesicular breath sounds, no wheezes or crackles  Cardiovascular system: S1 & S2 heard, RRR. No JVD, murmurs, rubs, gallops or clicks. Gastrointestinal system: Abdomen is nondistended, soft and nontender. No organomegaly or masses felt. Normal bowel sounds heard. Central nervous system: Alert and oriented. No focal neurological deficits. Extremities: Anasarca, no clubbing ,no cyanosis Skin: Edematous skin,no icterus ,no  pallor   Data Reviewed: I have personally reviewed following labs and imaging studies  CBC: Recent Labs  Lab 12/17/19 1056 12/20/19 0510 12/23/19 0606  WBC 5.1 4.9 5.1  NEUTROABS 3.1  --   --   HGB 9.8* 9.3* 9.5*  HCT 33.4* 32.5* 31.6*  MCV 78.2* 77.9* 74.0*  PLT 344 371 381   Basic Metabolic Panel: Recent Labs  Lab 12/18/19 0503 12/19/19 0419 12/20/19 0510 12/21/19 0437 12/22/19 0412  NA 138 137 137 137 140  K 3.9 3.3* 4.3 4.2 3.8  CL 99 97* 100 99 101  CO2 27 27 28 27 28   GLUCOSE 122* 98 90 88 109*  BUN 58* 57* 58* 60* 57*  CREATININE 2.69* 2.76* 2.63* 2.76* 2.87*  CALCIUM 8.9 9.1 9.1 9.2 9.6  MG  --   --  1.9 1.9 2.0   GFR: Estimated Creatinine Clearance: 23.6 mL/min (A) (by C-G formula based on SCr of 2.87 mg/dL (H)). Liver Function Tests: No results for input(s): AST, ALT, ALKPHOS, BILITOT, PROT, ALBUMIN in the last 168 hours. No results for input(s): LIPASE, AMYLASE in the last 168 hours. No results for input(s): AMMONIA in the last 168 hours. Coagulation Profile: No results for input(s): INR, PROTIME in the last 168 hours. Cardiac Enzymes: No results for input(s): CKTOTAL, CKMB, CKMBINDEX, TROPONINI in the last 168 hours. BNP (last 3 results) No results for input(s): PROBNP in the last 8760 hours. HbA1C: No results for input(s): HGBA1C in the last 72 hours. CBG: No results for input(s): GLUCAP in the last 168 hours. Lipid Profile: No results for input(s): CHOL, HDL, LDLCALC, TRIG, CHOLHDL, LDLDIRECT in the last 72 hours. Thyroid Function Tests: No results for input(s): TSH, T4TOTAL, FREET4, T3FREE, THYROIDAB in the last 72 hours. Anemia Panel: No results for input(s): VITAMINB12, FOLATE, FERRITIN, TIBC, IRON, RETICCTPCT in the last 72 hours. Sepsis Labs: No results for input(s): PROCALCITON, LATICACIDVEN in the last 168 hours.  No results found for this or any previous visit (from the past 240 hour(s)).       Radiology Studies: No results  found.      Scheduled Meds: . apixaban  5 mg Oral BID  . ciprofloxacin  500 mg Oral Q breakfast  . epoetin (EPOGEN/PROCRIT) injection  20,000 Units Subcutaneous Weekly  . furosemide  60 mg Intravenous BID  . iron polysaccharides  150 mg Oral Daily  . ketotifen  1 drop Both Eyes BID  . LORazepam  1 mg  Intramuscular Once  . mouth rinse  15 mL Mouth Rinse BID  . melatonin  2.5 mg Oral QHS  . midodrine  10 mg Oral TID WC  . multivitamin  1 tablet Oral QHS  . pantoprazole  40 mg Oral BID  . polyethylene glycol  17 g Oral Daily  . spironolactone  25 mg Oral Daily   Continuous Infusions:   LOS: 65 days    Time spent: More than 50% of that time was spent in counseling and/or coordination of care.      Shelly Coss, MD [facility] P12/15/2021, 1:55 PM

## 2019-12-23 NOTE — Progress Notes (Signed)
[facility]  ROUNDING NOTE   Subjective:   Patient sitting up in bed, very pleasant today. She denies SOB, not on supplemental O2.     Objective:  Vital signs in last 24 hours:  Temp:  [97.8 F (36.6 C)-98.7 F (37.1 C)] 97.9 F (36.6 C) (12/15 1126) Pulse Rate:  [74-89] 75 (12/15 1126) Resp:  [17-20] 20 (12/15 1126) BP: (112-131)/(87-98) 112/93 (12/15 1126) SpO2:  [93 %-100 %] 100 % (12/15 1126)  Weight change:  Filed Weights   12/20/19 0500 12/21/19 0740 12/22/19 0400  Weight: 101.2 kg 99.7 kg 99.8 kg    Intake/Output: I/O last 3 completed shifts: In: 240 [P.O.:240] Out: 1720 [VFIEP:3295]   Intake/Output this shift:  No intake/output data recorded.  Physical Exam: General:  In no acute distress  Head  Normocephalic,atraumatic  Lungs:   Lungs diminished at the bases, respirations even,unlabored  Heart: S1S2, no rubs or gallops  Abdomen:   Abdominal swelling +  Extremities:  2+peripheral edema,tight  Neurologic: Speech clear and appropriate  Skin: No acute lesions or rashes    Basic Metabolic Panel: Recent Labs  Lab 12/18/19 0503 12/19/19 0419 12/20/19 0510 12/21/19 0437 12/22/19 0412  NA 138 137 137 137 140  K 3.9 3.3* 4.3 4.2 3.8  CL 99 97* 100 99 101  CO2 27 27 28 27 28   GLUCOSE 122* 98 90 88 109*  BUN 58* 57* 58* 60* 57*  CREATININE 2.69* 2.76* 2.63* 2.76* 2.87*  CALCIUM 8.9 9.1 9.1 9.2 9.6  MG  --   --  1.9 1.9 2.0    Liver Function Tests: No results for input(s): AST, ALT, ALKPHOS, BILITOT, PROT, ALBUMIN in the last 168 hours. No results for input(s): LIPASE, AMYLASE in the last 168 hours. No results for input(s): AMMONIA in the last 168 hours.  CBC: Recent Labs  Lab 12/17/19 1056 12/20/19 0510 12/23/19 0606  WBC 5.1 4.9 5.1  NEUTROABS 3.1  --   --   HGB 9.8* 9.3* 9.5*  HCT 33.4* 32.5* 31.6*  MCV 78.2* 77.9* 74.0*  PLT 344 371 376    Cardiac Enzymes: No results for input(s): CKTOTAL, CKMB, CKMBINDEX, TROPONINI in  the last 168 hours.  BNP: Invalid input(s): POCBNP  CBG: No results for input(s): GLUCAP in the last 168 hours.  Microbiology: Results for orders placed or performed during the hospital encounter of 10/19/19  Respiratory Panel by RT PCR (Flu A&B, Covid) - Nasopharyngeal Swab     Status: None   Collection Time: 10/19/19 11:13 PM   Specimen: Nasopharyngeal Swab  Result Value Ref Range Status   SARS Coronavirus 2 by RT PCR NEGATIVE NEGATIVE Final    Comment: (NOTE) SARS-CoV-2 target nucleic acids are NOT DETECTED.  The SARS-CoV-2 RNA is generally detectable in upper respiratoy specimens during the acute phase of infection. The lowest concentration of SARS-CoV-2 viral copies this assay can detect is 131 copies/mL. A negative result does not preclude SARS-Cov-2 infection and should not be used as the sole basis for treatment or other patient management decisions. A negative result may occur with  improper specimen collection/handling, submission of specimen other than nasopharyngeal swab, presence of viral mutation(s) within the areas targeted by this assay, and inadequate number of viral copies (<131 copies/mL). A negative result must be combined with clinical observations, patient history, and epidemiological information. The expected result is Negative.  Fact Sheet for Patients:  PinkCheek.be  Fact Sheet for Healthcare Providers:  GravelBags.it  This test is no t yet  approved or cleared by the [place] and  has been authorized for detection and/or diagnosis of SARS-CoV-2 by FDA under an Emergency Use Authorization (EUA). This EUA will remain  in effect (meaning this test can be used) for the duration of the COVID-19 declaration under Section 564(b)(1) of the Act, 21 U.S.C. section 360bbb-3(b)(1), unless the authorization is terminated or revoked sooner.     Influenza A by PCR NEGATIVE NEGATIVE Final    Influenza B by PCR NEGATIVE NEGATIVE Final    Comment: (NOTE) The Xpert Xpress SARS-CoV-2/FLU/RSV assay is intended as an aid in  the diagnosis of influenza from Nasopharyngeal swab specimens and  should not be used as a sole basis for treatment. Nasal washings and  aspirates are unacceptable for Xpert Xpress SARS-CoV-2/FLU/RSV  testing.  Fact Sheet for Patients: PinkCheek.be  Fact Sheet for Healthcare Providers: GravelBags.it  This test is not yet approved or cleared by the [place] FDA and  has been authorized for detection and/or diagnosis of SARS-CoV-2 by  FDA under an Emergency Use Authorization (EUA). This EUA will remain  in effect (meaning this test can be used) for the duration of the  Covid-19 declaration under Section 564(b)(1) of the Act, 21  U.S.C. section 360bbb-3(b)(1), unless the authorization is  terminated or revoked. Performed at [facility], [address] [place], [place] [address]   MRSA PCR Screening     Status: None   Collection Time: 10/25/19  2:50 PM   Specimen: Nasopharyngeal  Result Value Ref Range Status   MRSA by PCR NEGATIVE NEGATIVE Final    Comment:        The GeneXpert MRSA Assay (FDA approved for NASAL specimens only), is one component of a comprehensive MRSA colonization surveillance program. It is not intended to diagnose MRSA infection nor to guide or monitor treatment for MRSA infections. Performed at [facility], [address] [place], [place] [address]   CULTURE, BLOOD (ROUTINE X 2) w Reflex to ID Panel     Status: None   Collection Time: 10/25/19  4:29 PM   Specimen: BLOOD  Result Value Ref Range Status   Specimen Description BLOOD CENTRAL LINE  Final   Special Requests   Final    BOTTLES DRAWN AEROBIC AND ANAEROBIC Blood Culture adequate volume   Culture   Final    NO GROWTH 5 DAYS Performed at [facility], [address]  [place], [place] [address]    Report Status 10/30/2019 FINAL  Final  CULTURE, BLOOD (ROUTINE X 2) w Reflex to ID Panel     Status: None   Collection Time: 10/26/19  6:45 AM   Specimen: BLOOD  Result Value Ref Range Status   Specimen Description BLOOD BLOOD LEFT HAND  Final   Special Requests   Final    BOTTLES DRAWN AEROBIC AND ANAEROBIC Blood Culture adequate volume   Culture   Final    NO GROWTH 5 DAYS Performed at [facility], [address] [place], [place] [address]    Report Status 10/31/2019 FINAL  Final  Body fluid culture     Status: None   Collection Time: 10/26/19 11:30 AM   Specimen: Fluid  Result Value Ref Range Status   Specimen Description   Final    FLUID Performed at [facility], [address] [place],  [address]    Special Requests   Final    PERITONEAL Performed at [facility], [address],  [place], [place] [address]    Gram Stain   Final    ABUNDANT WBC PRESENT, PREDOMINANTLY PMN NO ORGANISMS SEEN    Culture   Final    NO GROWTH Performed at [place] [address] [place], [place] [address]    Report Status 10/30/2019 FINAL  Final  Body fluid culture     Status: None   Collection Time: 10/29/19  1:39 PM   Specimen: PATH Cytology Peritoneal fluid  Result Value Ref Range Status   Specimen Description   Final    PERITONEAL Performed at [facility], [address] [place], [place] [address]    Special Requests   Final    NONE Performed at [facility], [address] [place], [place] [address]    Gram Stain   Final    MODERATE WBC PRESENT, PREDOMINANTLY PMN NO ORGANISMS SEEN    Culture   Final    NO GROWTH 3 DAYS Performed at [facility], [place] [address] [place], [place] [address]    Report Status 11/02/2019 FINAL  Final  Gram stain     Status: None   Collection Time: 11/02/19  3:30 PM   Specimen: PATH Cytology Peritoneal fluid  Result Value Ref Range Status    Specimen Description PERITONEAL  Final   Special Requests NONE  Final   Gram Stain   Final    WBC SEEN RED BLOOD CELLS NO ORGANISMS SEEN Performed at [facility], [address] [place], [place] [address]    Report Status 11/02/2019 FINAL  Final    Coagulation Studies: No results for input(s): LABPROT, INR in the last 72 hours.  Urinalysis: No results for input(s): COLORURINE, LABSPEC, PHURINE, GLUCOSEU, HGBUR, BILIRUBINUR, KETONESUR, PROTEINUR, UROBILINOGEN, NITRITE, LEUKOCYTESUR in the last 72 hours.  Invalid input(s): APPERANCEUR    Imaging: No results found.   Medications:    . apixaban  5 mg Oral BID  . ciprofloxacin  500 mg Oral Q breakfast  . epoetin (EPOGEN/PROCRIT) injection  20,000 Units Subcutaneous Weekly  . furosemide  60 mg Intravenous BID  . iron polysaccharides  150 mg Oral Daily  . ketotifen  1 drop Both Eyes BID  . LORazepam  1 mg Intramuscular Once  . mouth rinse  15 mL Mouth Rinse BID  . melatonin  2.5 mg Oral QHS  . midodrine  10 mg Oral TID WC  . multivitamin  1 tablet Oral QHS  . pantoprazole  40 mg Oral BID  . polyethylene glycol  17 g Oral Daily  . spironolactone  25 mg Oral Daily   acetaminophen, albuterol, alum & mag hydroxide-simeth **AND** lidocaine, bisacodyl, calcium carbonate, hydrocortisone cream, hydrOXYzine, loperamide, ondansetron (ZOFRAN) IV, oxyCODONE-acetaminophen  Assessment/ Plan:  Katelyn Lane is a 64 y.o. black female with congestive heart failure, hypertension, hyperlipidemia, gout, COPD, anemia, cocaine abuse who is admitted to [facility] on 10/19/2019 for Peripheral edema [R60.9] Acute CHF (congestive heart failure) ([place]) [I50.9] AKI (acute kidney injury) ([place]) [N17.9] Congestive heart failure, unspecified HF chronicity, unspecified heart failure type ([place]) [S01.0] Acute diastolic (congestive) heart failure ([place]) [I50.31]  # Acute kidney injury on chronic kidney disease stage IIIB.   #Volume  Overload   Lab Results  Component Value Date   CREATININE 2.87 (H) 12/22/2019   CREATININE 2.76 (H) 12/21/2019   CREATININE 2.63 (H) 12/20/2019  Creatinine trending up progressively Denies SOB, but significant peripheral edema noted Patient is on Furosemide and Spironolactone Will continue monitoring renal  function and volume status closely Patient received dialysis treatment in the past, but declined dialysis, later during this admission  2. Hypotension with acute exacerbation of systolic and diastolic congestive heart failure. Echocardiogram 06/22/19 EF of 25-30%. Blood pressure readings within acceptable range  3. Anemia of Chronic Disease Lab Results  Component Value Date   HGB 9.5 (L) 12/23/2019  On Epogen weekly   LOS: 65 Aneesh Faller 12/15/20211:50 PM

## 2019-12-24 LAB — BASIC METABOLIC PANEL
Anion gap: 10 (ref 5–15)
BUN: 58 mg/dL — ABNORMAL HIGH (ref 8–23)
CO2: 27 mmol/L (ref 22–32)
Calcium: 9.2 mg/dL (ref 8.9–10.3)
Chloride: 103 mmol/L (ref 98–111)
Creatinine, Ser: 3.35 mg/dL — ABNORMAL HIGH (ref 0.44–1.00)
GFR, Estimated: 15 mL/min — ABNORMAL LOW (ref >60)
Glucose, Bld: 116 mg/dL — ABNORMAL HIGH (ref 70–99)
Potassium: 4.1 mmol/L (ref 3.5–5.1)
Sodium: 140 mmol/L (ref 135–145)

## 2019-12-24 MED ORDER — BUMETANIDE 1 MG PO TABS
1.0000 mg | ORAL_TABLET | Freq: Two times a day (BID) | ORAL | Status: DC
  Administered 2019-12-24 – 2019-12-26 (×4): 1 mg via ORAL
  Filled 2019-12-24 (×5): qty 1

## 2019-12-24 NOTE — Plan of Care (Signed)
  Problem: Education: Goal: Knowledge of General Education information will improve Description: Including pain rating scale, medication(s)/side effects and non-pharmacologic comfort measures Outcome: Progressing   Problem: Health Behavior/Discharge Planning: Goal: Ability to manage health-related needs will improve Outcome: Progressing   Problem: Clinical Measurements: Goal: Ability to maintain clinical measurements within normal limits will improve Outcome: Progressing Goal: Will remain free from infection Outcome: Progressing Goal: Diagnostic test results will improve Outcome: Progressing Goal: Respiratory complications will improve Outcome: Progressing Goal: Cardiovascular complication will be avoided Outcome: Progressing   Problem: Activity: Goal: Risk for activity intolerance will decrease Outcome: Progressing   Problem: Nutrition: Goal: Adequate nutrition will be maintained Outcome: Progressing   Problem: Coping: Goal: Level of anxiety will decrease Outcome: Progressing   Problem: Elimination: Goal: Will not experience complications related to bowel motility Outcome: Progressing Goal: Will not experience complications related to urinary retention Outcome: Progressing   Problem: Pain Managment: Goal: General experience of comfort will improve Outcome: Progressing   Problem: Safety: Goal: Ability to remain free from injury will improve Outcome: Progressing   Problem: Skin Integrity: Goal: Risk for impaired skin integrity will decrease Outcome: Progressing   Problem: Education: Goal: Ability to demonstrate management of disease process will improve Outcome: Progressing Goal: Ability to verbalize understanding of medication therapies will improve Outcome: Progressing Goal: Individualized Educational Video(s) Outcome: Progressing   Problem: Activity: Goal: Capacity to carry out activities will improve Outcome: Progressing   Problem: Cardiac: Goal:  Ability to achieve and maintain adequate cardiopulmonary perfusion will improve Outcome: Progressing   Problem: Education: Goal: Knowledge of disease and its progression will improve Outcome: Progressing Goal: Individualized Educational Video(s) Outcome: Progressing   Problem: Fluid Volume: Goal: Compliance with measures to maintain balanced fluid volume will improve Outcome: Progressing   Problem: Health Behavior/Discharge Planning: Goal: Ability to manage health-related needs will improve Outcome: Progressing   Problem: Nutritional: Goal: Ability to make healthy dietary choices will improve Outcome: Progressing   Problem: Clinical Measurements: Goal: Complications related to the disease process, condition or treatment will be avoided or minimized Outcome: Progressing

## 2019-12-24 NOTE — Progress Notes (Signed)
[facility]  ROUNDING NOTE   Subjective:   Patient appears pleasant and comfortable, resting in bed, 2L of supplemental O2 via nasal cannula  in place, reports mild SOB.   Objective:  Vital signs in last 24 hours:  Temp:  [97.7 F (36.5 C)-98 F (36.7 C)] 98 F (36.7 C) (12/16 0750) Pulse Rate:  [67-89] 76 (12/16 1140) Resp:  [16-18] 18 (12/16 1140) BP: (111-118)/(88-97) 114/97 (12/16 1140) SpO2:  [92 %-100 %] 93 % (12/16 1140)  Weight change:  Filed Weights   12/20/19 0500 12/21/19 0740 12/22/19 0400  Weight: 101.2 kg 99.7 kg 99.8 kg    Intake/Output: I/O last 3 completed shifts: In: -  Out: 870 [Urine:870]   Intake/Output this shift:  Total I/O In: 120 [P.O.:120] Out: -   Physical Exam: General: Resting in bed,in no apparent distress  Head  Normocephalic,atraumatic  Lungs:   Normal and symmetrical respiratory effort, Lungs diminished at the bases  Heart: Regular rate and rhythm  Abdomen:   Anasarca +  Extremities:  1+ peripheral edema   Neurologic: Awake, alert, speech clear and appropriate  Skin: No acute lesions or rashes    Basic Metabolic Panel: Recent Labs  Lab 12/19/19 0419 12/20/19 0510 12/21/19 0437 12/22/19 0412 12/24/19 0449  NA 137 137 137 140 140  K 3.3* 4.3 4.2 3.8 4.1  CL 97* 100 99 101 103  CO2 27 28 27 28 27   GLUCOSE 98 90 88 109* 116*  BUN 57* 58* 60* 57* 58*  CREATININE 2.76* 2.63* 2.76* 2.87* 3.35*  CALCIUM 9.1 9.1 9.2 9.6 9.2  MG  --  1.9 1.9 2.0  --     Liver Function Tests: No results for input(s): AST, ALT, ALKPHOS, BILITOT, PROT, ALBUMIN in the last 168 hours. No results for input(s): LIPASE, AMYLASE in the last 168 hours. No results for input(s): AMMONIA in the last 168 hours.  CBC: Recent Labs  Lab 12/20/19 0510 12/23/19 0606  WBC 4.9 5.1  HGB 9.3* 9.5*  HCT 32.5* 31.6*  MCV 77.9* 74.0*  PLT 371 376    Cardiac Enzymes: No results for input(s): CKTOTAL, CKMB, CKMBINDEX, TROPONINI in the last  168 hours.  BNP: Invalid input(s): POCBNP  CBG: No results for input(s): GLUCAP in the last 168 hours.  Microbiology: Results for orders placed or performed during the hospital encounter of 10/19/19  Respiratory Panel by RT PCR (Flu A&B, Covid) - Nasopharyngeal Swab     Status: None   Collection Time: 10/19/19 11:13 PM   Specimen: Nasopharyngeal Swab  Result Value Ref Range Status   SARS Coronavirus 2 by RT PCR NEGATIVE NEGATIVE Final    Comment: (NOTE) SARS-CoV-2 target nucleic acids are NOT DETECTED.  The SARS-CoV-2 RNA is generally detectable in upper respiratoy specimens during the acute phase of infection. The lowest concentration of SARS-CoV-2 viral copies this assay can detect is 131 copies/mL. A negative result does not preclude SARS-Cov-2 infection and should not be used as the sole basis for treatment or other patient management decisions. A negative result may occur with  improper specimen collection/handling, submission of specimen other than nasopharyngeal swab, presence of viral mutation(s) within the areas targeted by this assay, and inadequate number of viral copies (<131 copies/mL). A negative result must be combined with clinical observations, patient history, and epidemiological information. The expected result is Negative.  Fact Sheet for Patients:  PinkCheek.be  Fact Sheet for Healthcare Providers:  GravelBags.it  This test is no t yet approved or  cleared by the [place] and  has been authorized for detection and/or diagnosis of SARS-CoV-2 by FDA under an Emergency Use Authorization (EUA). This EUA will remain  in effect (meaning this test can be used) for the duration of the COVID-19 declaration under Section 564(b)(1) of the Act, 21 U.S.C. section 360bbb-3(b)(1), unless the authorization is terminated or revoked sooner.     Influenza A by PCR NEGATIVE NEGATIVE Final   Influenza B by  PCR NEGATIVE NEGATIVE Final    Comment: (NOTE) The Xpert Xpress SARS-CoV-2/FLU/RSV assay is intended as an aid in  the diagnosis of influenza from Nasopharyngeal swab specimens and  should not be used as a sole basis for treatment. Nasal washings and  aspirates are unacceptable for Xpert Xpress SARS-CoV-2/FLU/RSV  testing.  Fact Sheet for Patients: PinkCheek.be  Fact Sheet for Healthcare Providers: GravelBags.it  This test is not yet approved or cleared by the [place] FDA and  has been authorized for detection and/or diagnosis of SARS-CoV-2 by  FDA under an Emergency Use Authorization (EUA). This EUA will remain  in effect (meaning this test can be used) for the duration of the  Covid-19 declaration under Section 564(b)(1) of the Act, 21  U.S.C. section 360bbb-3(b)(1), unless the authorization is  terminated or revoked. Performed at [facility], [address] [place], [place] [address]   MRSA PCR Screening     Status: None   Collection Time: 10/25/19  2:50 PM   Specimen: Nasopharyngeal  Result Value Ref Range Status   MRSA by PCR NEGATIVE NEGATIVE Final    Comment:        The GeneXpert MRSA Assay (FDA approved for NASAL specimens only), is one component of a comprehensive MRSA colonization surveillance program. It is not intended to diagnose MRSA infection nor to guide or monitor treatment for MRSA infections. Performed at [facility], [address] [place], [place] [address]   CULTURE, BLOOD (ROUTINE X 2) w Reflex to ID Panel     Status: None   Collection Time: 10/25/19  4:29 PM   Specimen: BLOOD  Result Value Ref Range Status   Specimen Description BLOOD CENTRAL LINE  Final   Special Requests   Final    BOTTLES DRAWN AEROBIC AND ANAEROBIC Blood Culture adequate volume   Culture   Final    NO GROWTH 5 DAYS Performed at [facility], [address] [place], [place]  [address]    Report Status 10/30/2019 FINAL  Final  CULTURE, BLOOD (ROUTINE X 2) w Reflex to ID Panel     Status: None   Collection Time: 10/26/19  6:45 AM   Specimen: BLOOD  Result Value Ref Range Status   Specimen Description BLOOD BLOOD LEFT HAND  Final   Special Requests   Final    BOTTLES DRAWN AEROBIC AND ANAEROBIC Blood Culture adequate volume   Culture   Final    NO GROWTH 5 DAYS Performed at [facility], [address] [place], [place] [address]    Report Status 10/31/2019 FINAL  Final  Body fluid culture     Status: None   Collection Time: 10/26/19 11:30 AM   Specimen: Fluid  Result Value Ref Range Status   Specimen Description   Final    FLUID Performed at [facility], [address], [place], [place] [address]    Special Requests   Final    PERITONEAL Performed at [facility], [address] [place], [place]  [address]    Gram Stain   Final    ABUNDANT WBC PRESENT, PREDOMINANTLY PMN NO ORGANISMS SEEN    Culture   Final    NO GROWTH Performed at [facility], [place] [address], [place], [place] [address]    Report Status 10/30/2019 FINAL  Final  Body fluid culture     Status: None   Collection Time: 10/29/19  1:39 PM   Specimen: PATH Cytology Peritoneal fluid  Result Value Ref Range Status   Specimen Description   Final    PERITONEAL Performed at [facility], [address], [place], [place] [address]    Special Requests   Final    NONE Performed at [facility], [address] [place], [place] [address]    Gram Stain   Final    MODERATE WBC PRESENT, PREDOMINANTLY PMN NO ORGANISMS SEEN    Culture   Final    NO GROWTH 3 DAYS Performed at [facility], [place] [address] [place], [place] [address]    Report Status 11/02/2019 FINAL  Final  Gram stain     Status: None   Collection Time: 11/02/19  3:30 PM   Specimen: PATH Cytology Peritoneal fluid  Result Value Ref Range Status   Specimen  Description PERITONEAL  Final   Special Requests NONE  Final   Gram Stain   Final    WBC SEEN RED BLOOD CELLS NO ORGANISMS SEEN Performed at [facility], [address], [place], [place] [address]    Report Status 11/02/2019 FINAL  Final    Coagulation Studies: No results for input(s): LABPROT, INR in the last 72 hours.  Urinalysis: No results for input(s): COLORURINE, LABSPEC, PHURINE, GLUCOSEU, HGBUR, BILIRUBINUR, KETONESUR, PROTEINUR, UROBILINOGEN, NITRITE, LEUKOCYTESUR in the last 72 hours.  Invalid input(s): APPERANCEUR    Imaging: No results found.   Medications:    . apixaban  5 mg Oral BID  . bumetanide  1 mg Oral BID  . ciprofloxacin  500 mg Oral Q breakfast  . epoetin (EPOGEN/PROCRIT) injection  20,000 Units Subcutaneous Weekly  . iron polysaccharides  150 mg Oral Daily  . ketotifen  1 drop Both Eyes BID  . LORazepam  1 mg Intramuscular Once  . mouth rinse  15 mL Mouth Rinse BID  . melatonin  2.5 mg Oral QHS  . midodrine  10 mg Oral TID WC  . multivitamin  1 tablet Oral QHS  . pantoprazole  40 mg Oral BID  . polyethylene glycol  17 g Oral Daily  . spironolactone  25 mg Oral Daily   acetaminophen, albuterol, alum & mag hydroxide-simeth **AND** lidocaine, bisacodyl, calcium carbonate, hydrocortisone cream, hydrOXYzine, loperamide, ondansetron (ZOFRAN) IV, oxyCODONE-acetaminophen  Assessment/ Plan:  [name] is a 64 y.o. black female with congestive heart failure, hypertension, hyperlipidemia, gout, COPD, anemia, cocaine abuse who is admitted to [facility] on 10/19/2019 for Peripheral edema [R60.9] Acute CHF (congestive heart failure) ([place]) [I50.9] AKI (acute kidney injury) ([place]) [N17.9] Congestive heart failure, unspecified HF chronicity, unspecified heart failure type ([place]) [W38.9] Acute diastolic (congestive) heart failure ([place]) [I50.31]  # Acute kidney injury on chronic kidney disease stage IIIB.   #Volume Overload   Lab Results   Component Value Date   CREATININE 3.35 (H) 12/24/2019   CREATININE 2.87 (H) 12/22/2019   CREATININE 2.76 (H) 12/21/2019  Creatinine progressively worsening We will discontinue IV Lasix, will start PO Bumex Will continue monitoring renal function  2. Hypotension with acute exacerbation  of systolic and diastolic congestive heart failure. Echocardiogram 06/22/19 EF of 25-30%. Patient requiring 2L of O2 via nasal cannula today, reports mild SOB  Peripheral edema improved slightly Blood pressure readings within acceptable range  3. Anemia of Chronic Disease Lab Results  Component Value Date   HGB 9.5 (L) 12/23/2019  Continue Epogen 20,000 units subcutaneous weekly   LOS: 66 Katelyn [address] 12/16/202111:47 AM

## 2019-12-24 NOTE — Progress Notes (Signed)
   12/24/19 1155  Clinical Encounter Type  Visited With Family;Health care provider  Visit Type Follow-up  Referral From Chaplain  Consult/Referral To [place] visited with Pt. When chaplain arrived a sitter was in the room with Pt. Pt told chaplain she was fine, but want to go out of the room. Chaplain gave Pt decorative face mask and Pt was happy. Chaplain will follow up with Pt later.

## 2019-12-24 NOTE — Progress Notes (Signed)
PROGRESS NOTE    [name]  PPJ:[id number] DOB: Sep 15, 1955 DOA: 10/19/2019 PCP: [name], MD   Brief Narrative: Katelyn Lane is a 64 y.o. 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 After admission to the hospital, she was diagnosed with acute on chronic combinedsystolic and diastolic congestive heart failurewith ejection fraction 25 to 30%. She was not responding to IV Lasix. She was seen by nephrology, temporary dialysis catheter was performed and she was dialyzed on 10/17 and 10/18.Patient also complaining of abdominal pain, she had a paracentesis, study showed possible spontaneous peritonitis, she was treated with Rocephin and Flagyl. Patient had a worsening respiratory status on 10/17, was diagnosed with aspiration pneumonia. She was treated with Rocephin, Zithromax and Flagyl.Patient also had abdominal pain and a CT scan suspect ischemia, but seen by general surgery on 10/19, no need for surgery. Patient was evaluated by psychiatry and she does not have decision-making capacity. Family does not want to participate in making decisions for her. DSS case was filed for guardianship and still pending. Difficult disposition due to unsafe discharge. Nephrology team is also following.  She has been started on IV Lasix and spironolactone for volume overload.  Assessment & Plan:   Principal Problem:   Spontaneous bacterial peritonitis ([place]) Active Problems:   AKI (acute kidney injury) ([place])   Crack cocaine use   Acute respiratory failure with hypoxia (HCC)   LV (left ventricular) mural thrombus   Chronic kidney disease (CKD) stage G3b/A1, moderately decreased  glomerular filtration rate (GFR) between 30-44 mL/min/1.73 square meter and albuminuria creatinine ratio less than 30 mg/g (HCC)   Nonsustained ventricular tachycardia (HCC)   Acute CHF (congestive heart failure) (HCC)   Cocaine abuse ([place])   Peripheral edema   Cirrhosis of liver (HCC)   Dementia (HCC)   Aspiration pneumonia of both lower lobes due to gastric secretions (HCC)   Acute diastolic (congestive) heart failure ([place])   Cardiorenal disease   Goals of care, counseling/discussion   Palliative care by specialist   Ascites due to alcoholic cirrhosis ([place])   Liver cirrhosis with ascites complicated by SBP: s/p paracentesis x2 (11/02/2019 and 12/15/2019).  Paracentesis could not be performed on 12/17/2019 because there was not enough fluid on abdominal ultrasound for safe paracentesis.   Monitor BMP.   Completed antibiotics for SBP. Continue ciprofloxacin for SBP prophylaxis.  Acute on chronic combined systolic and diastolic CHF: echo on 5/80/99 shows EF of 25-30%, grade III diastolic dysfunction. . Received HD x 2 but does not want HD again.    She looked extremely volume overloaded and had severe  peripheral edema.  IV lasix now D/ced . Continue bumex  and spironolactone.  Nephrology also following   Acute hypoxemic respiratory failure: Requiring 1-2 L of oxygen pulmonary.  Lungs are clear on auscultation.  This is most likely secondary to congestion from volume overload  Hypotension: Borderline low BP.  Continue midodrine  AKI on CKDIIIb: Creatinine in the range of 3.  Nephrology following.  Continue monitoring  Left ventricular thrombus:w/ hx of CVA.  Continue Eliquis  Overgrown toenails: Follow-up with podiatrist as an outpatient.  Anemia of chronic disease:continue on iron supplements.H&H are stable  Hypokalemia: Being monitored and supplemented  Depression/insomnia: Trazodone was discontinued on 12/17/2019 per  patient's request  Vaginal Itching, hyponatremia,  thrombocytosis:resolved.  Decision Making Capacity: psych does not believe the pt has the pt has the capacity to make rational decision in regards to formulating a discharge plan. No evidence of imminent risk to self or others currently as per psych   Other comorbidities include GERD, constipation and depression, insomnia  Nutrition Problem: Increased nutrient needs Etiology: chronic illness (cirrhosis, CHF)      DVT prophylaxis:Eliquis Code Status: DNR Family Communication: None at the bedside Status is: Inpatient  Remains inpatient appropriate because:Unsafe d/c plan   Dispo: The patient is from: Home              Anticipated d/c is to: SNF              Anticipated d/c date is: > 3 days              Patient currently is not medically stable to d/c.   consultants: Nephrology  Procedures:None  Antimicrobials:  Anti-infectives (From admission, onward)   Start     Dose/Rate Route Frequency Ordered Stop   11/14/19 0800  ciprofloxacin (CIPRO) tablet 500 mg        500 mg Oral Daily with breakfast 11/13/19 1938     11/01/19 1000  cefTRIAXone (ROCEPHIN) 2 g in sodium chloride 0.9 % 100 mL IVPB  Status:  Discontinued        2 g 200 mL/hr over 30 Minutes Intravenous Daily 10/31/19 1521 11/09/19 1425   10/27/19 1000  cefTRIAXone (ROCEPHIN) 2 g in sodium chloride 0.9 % 100 mL IVPB  Status:  Discontinued        2 g 200 mL/hr over 30 Minutes Intravenous Daily 10/26/19 1559 10/31/19 1521   10/26/19 2000  cefTRIAXone (ROCEPHIN) 1 g in sodium chloride 0.9 % 100 mL IVPB        1 g 200 mL/hr over 30 Minutes Intravenous  Once 10/26/19 1559 10/26/19 2024   10/26/19 1600  cefTRIAXone (ROCEPHIN) 2 g in sodium chloride 0.9 % 100 mL IVPB  Status:  Discontinued        2 g 200 mL/hr over 30 Minutes Intravenous Every 24 hours 10/26/19 1351 10/26/19 1600   10/26/19 1000  metroNIDAZOLE (FLAGYL) IVPB 500 mg  Status:  Discontinued        500 mg 100 mL/hr over 60 Minutes Intravenous Every 8 hours  10/26/19 0909 10/29/19 1138   10/25/19 1445  azithromycin (ZITHROMAX) 500 mg in sodium chloride 0.9 % 250 mL IVPB  Status:  Discontinued        500 mg 250 mL/hr over 60 Minutes Intravenous Every 24 hours 10/25/19 1353 10/29/19 1138   10/25/19 1230  azithromycin (ZITHROMAX) tablet 500 mg  Status:  Discontinued        500 mg Oral Daily 10/25/19 1133 10/25/19 1134   10/25/19 1030  cefTRIAXone (ROCEPHIN) 1 g in sodium chloride 0.9 % 100 mL IVPB  Status:  Discontinued        1 g 200 mL/hr over 30 Minutes Intravenous Every 12 hours 10/25/19 0931 10/26/19 1351      Subjective:  Patient seen and examined at the bedside this morning.  Currently stable.  Comfortable.  She was concerned about fluid buildup in her abdomen and was requesting for paracentesis but examination did not reveal tense abdomen. Peripheral edema has somewhat improved today.  Objective: Vitals:   12/23/19 1700 12/23/19 2202 12/24/19 0214 12/24/19 0750  BP: (!) 118/91 (!) 117/95 116/88 (!) 111/96  Pulse:  67 71 85  Resp:  18 18 18   Temp:  98 F (36.7 C) 98 F (36.7 C) 98 F (36.7 C)  TempSrc:  Oral Oral Oral  SpO2:  95% 95% 92%  Weight:      Height:        Intake/Output Summary (Last 24 hours) at 12/24/2019 0807 Last data filed at 12/24/2019 0458 Gross per 24 hour  Intake --  Output 250 ml  Net -250 ml   Filed Weights   12/20/19 0500 12/21/19 0740 12/22/19 0400  Weight: 101.2 kg 99.7 kg 99.8 kg    Examination:   General exam: overall comfortable Respiratory system: Bilateral equal air entry, normal vesicular breath sounds, no wheezes or crackles  Cardiovascular system: S1 & S2 heard, RRR. No JVD, murmurs, rubs, gallops or clicks. Gastrointestinal system: Abdomen is distended, soft and nontender. No organomegaly or masses felt. Normal bowel sounds heard. Central nervous system: Alert and oriented. No focal neurological deficits. Extremities: 1-2 + bilateral pitting lower extremity  edema, no clubbing ,no  cyanosis Skin: Thickened/edematous skin,no ulcers,no icterus ,no pallor  Data Reviewed: I have personally reviewed following labs and imaging studies  CBC: Recent Labs  Lab 12/17/19 1056 12/20/19 0510 12/23/19 0606  WBC 5.1 4.9 5.1  NEUTROABS 3.1  --   --   HGB 9.8* 9.3* 9.5*  HCT 33.4* 32.5* 31.6*  MCV 78.2* 77.9* 74.0*  PLT 344 371 902   Basic Metabolic Panel: Recent Labs  Lab 12/19/19 0419 12/20/19 0510 12/21/19 0437 12/22/19 0412 12/24/19 0449  NA 137 137 137 140 140  K 3.3* 4.3 4.2 3.8 4.1  CL 97* 100 99 101 103  CO2 27 28 27 28 27   GLUCOSE 98 90 88 109* 116*  BUN 57* 58* 60* 57* 58*  CREATININE 2.76* 2.63* 2.76* 2.87* 3.35*  CALCIUM 9.1 9.1 9.2 9.6 9.2  MG  --  1.9 1.9 2.0  --    GFR: Estimated Creatinine Clearance: 20.2 mL/min (A) (by C-G formula based on SCr of 3.35 mg/dL (H)). Liver Function Tests: No results for input(s): AST, ALT, ALKPHOS, BILITOT, PROT, ALBUMIN in the last 168 hours. No results for input(s): LIPASE, AMYLASE in the last 168 hours. No results for input(s): AMMONIA in the last 168 hours. Coagulation Profile: No results for input(s): INR, PROTIME in the last 168 hours. Cardiac Enzymes: No results for input(s): CKTOTAL, CKMB, CKMBINDEX, TROPONINI in the last 168 hours. BNP (last 3 results) No results for input(s): PROBNP in the last 8760 hours. HbA1C: No results for input(s): HGBA1C in the last 72 hours. CBG: No results for input(s): GLUCAP in the last 168 hours. Lipid Profile: No results for input(s): CHOL, HDL, LDLCALC, TRIG, CHOLHDL, LDLDIRECT in the last 72 hours. Thyroid Function Tests: No results for input(s): TSH, T4TOTAL, FREET4, T3FREE, THYROIDAB in the last 72 hours. Anemia Panel: No results for input(s): VITAMINB12, FOLATE, FERRITIN, TIBC, IRON, RETICCTPCT in the last 72 hours. Sepsis Labs: No results for input(s): PROCALCITON, LATICACIDVEN in the last 168 hours.  No results found for this or any previous visit (from the  past 240 hour(s)).       Radiology Studies: No results found.      Scheduled Meds: . apixaban  5 mg Oral BID  . ciprofloxacin  500 mg Oral Q breakfast  . epoetin (EPOGEN/PROCRIT) injection  20,000 Units Subcutaneous Weekly  . furosemide  60 mg Intravenous BID  . iron polysaccharides  150 mg Oral Daily  . ketotifen  1 drop Both Eyes BID  . LORazepam  1 mg Intramuscular Once  . mouth rinse  15 mL Mouth Rinse BID  . melatonin  2.5 mg Oral QHS  . midodrine  10 mg Oral TID WC  . multivitamin  1 tablet Oral QHS  . pantoprazole  40 mg Oral BID  . polyethylene glycol  17 g Oral Daily  . spironolactone  25 mg Oral Daily   Continuous Infusions:   LOS: 66 days    Time spent: 25 mins.More than 50% of that time was spent in counseling and/or coordination of care.      Shelly Coss, MD [facility] P12/16/2021, 8:07 AM

## 2019-12-25 ENCOUNTER — Inpatient Hospital Stay: Payer: Medicaid Other

## 2019-12-25 LAB — BASIC METABOLIC PANEL
Anion gap: 12 (ref 5–15)
BUN: 57 mg/dL — ABNORMAL HIGH (ref 8–23)
CO2: 26 mmol/L (ref 22–32)
Calcium: 9.1 mg/dL (ref 8.9–10.3)
Chloride: 102 mmol/L (ref 98–111)
Creatinine, Ser: 3.38 mg/dL — ABNORMAL HIGH (ref 0.44–1.00)
GFR, Estimated: 15 mL/min — ABNORMAL LOW (ref >60)
Glucose, Bld: 89 mg/dL (ref 70–99)
Potassium: 3.7 mmol/L (ref 3.5–5.1)
Sodium: 140 mmol/L (ref 135–145)

## 2019-12-25 NOTE — Plan of Care (Signed)
  Problem: Education: Goal: Knowledge of General Education information will improve Description: Including pain rating scale, medication(s)/side effects and non-pharmacologic comfort measures Outcome: Progressing   Problem: Health Behavior/Discharge Planning: Goal: Ability to manage health-related needs will improve Outcome: Progressing   Problem: Clinical Measurements: Goal: Ability to maintain clinical measurements within normal limits will improve Outcome: Progressing Goal: Will remain free from infection Outcome: Progressing Goal: Diagnostic test results will improve Outcome: Progressing Goal: Respiratory complications will improve Outcome: Progressing Goal: Cardiovascular complication will be avoided Outcome: Progressing   Problem: Clinical Measurements: Goal: Ability to maintain clinical measurements within normal limits will improve Outcome: Progressing Goal: Will remain free from infection Outcome: Progressing Goal: Diagnostic test results will improve Outcome: Progressing Goal: Respiratory complications will improve Outcome: Progressing Goal: Cardiovascular complication will be avoided Outcome: Progressing   Problem: Activity: Goal: Risk for activity intolerance will decrease Outcome: Progressing   Problem: Nutrition: Goal: Adequate nutrition will be maintained Outcome: Progressing   Problem: Coping: Goal: Level of anxiety will decrease Outcome: Progressing   Problem: Elimination: Goal: Will not experience complications related to bowel motility Outcome: Progressing Goal: Will not experience complications related to urinary retention Outcome: Progressing   Problem: Pain Managment: Goal: General experience of comfort will improve Outcome: Progressing   Problem: Safety: Goal: Ability to remain free from injury will improve Outcome: Progressing   Problem: Skin Integrity: Goal: Risk for impaired skin integrity will decrease Outcome: Progressing    Problem: Education: Goal: Ability to demonstrate management of disease process will improve Outcome: Progressing Goal: Ability to verbalize understanding of medication therapies will improve Outcome: Progressing Goal: Individualized Educational Video(s) Outcome: Progressing   Problem: Activity: Goal: Capacity to carry out activities will improve Outcome: Progressing   Problem: Cardiac: Goal: Ability to achieve and maintain adequate cardiopulmonary perfusion will improve Outcome: Progressing   Problem: Education: Goal: Knowledge of disease and its progression will improve Outcome: Progressing Goal: Individualized Educational Video(s) Outcome: Progressing   Problem: Fluid Volume: Goal: Compliance with measures to maintain balanced fluid volume will improve Outcome: Progressing   Problem: Health Behavior/Discharge Planning: Goal: Ability to manage health-related needs will improve Outcome: Progressing   Problem: Nutritional: Goal: Ability to make healthy dietary choices will improve Outcome: Progressing   Problem: Clinical Measurements: Goal: Complications related to the disease process, condition or treatment will be avoided or minimized Outcome: Progressing

## 2019-12-25 NOTE — Progress Notes (Signed)
[facility]  ROUNDING NOTE   Subjective:   Patient resting in bed, has c/o SOB  with exertion, appears comfortable at rest. She has supplemental O2 2L via nasal cannula on flow.    Objective:  Vital signs in last 24 hours:  Temp:  [97.6 F (36.4 C)-98 F (36.7 C)] 97.7 F (36.5 C) (12/17 0455) Pulse Rate:  [60-75] 69 (12/17 0455) Resp:  [16-20] 16 (12/17 0455) BP: (103-117)/(77-89) 107/83 (12/17 0455) SpO2:  [95 %-100 %] 95 % (12/17 0455)  Weight change:  Filed Weights   12/20/19 0500 12/21/19 0740 12/22/19 0400  Weight: 101.2 kg 99.7 kg 99.8 kg    Intake/Output: I/O last 3 completed shifts: In: 360 [P.O.:360] Out: 250 [Urine:250]   Intake/Output this shift:  Total I/O In: 480 [P.O.:480] Out: -   Physical Exam: General: Awake,alert, pleasant  Head  Normocephalic,atraumatic  Lungs:   Respirations even,unlabored, lungs diminished at the bases  Heart: Regular rate and rhythm  Abdomen:   Abdominal swelling +  Extremities:  2+peripheral edema,tight  Neurologic: Speech clear and appropriate  Skin: No acute lesions or rashes    Basic Metabolic Panel: Recent Labs  Lab 12/20/19 0510 12/21/19 0437 12/22/19 0412 12/24/19 0449 12/25/19 0518  NA 137 137 140 140 140  K 4.3 4.2 3.8 4.1 3.7  CL 100 99 101 103 102  CO2 28 27 28 27 26   GLUCOSE 90 88 109* 116* 89  BUN 58* 60* 57* 58* 57*  CREATININE 2.63* 2.76* 2.87* 3.35* 3.38*  CALCIUM 9.1 9.2 9.6 9.2 9.1  MG 1.9 1.9 2.0  --   --     Liver Function Tests: No results for input(s): AST, ALT, ALKPHOS, BILITOT, PROT, ALBUMIN in the last 168 hours. No results for input(s): LIPASE, AMYLASE in the last 168 hours. No results for input(s): AMMONIA in the last 168 hours.  CBC: Recent Labs  Lab 12/20/19 0510 12/23/19 0606  WBC 4.9 5.1  HGB 9.3* 9.5*  HCT 32.5* 31.6*  MCV 77.9* 74.0*  PLT 371 376    Cardiac Enzymes: No results for input(s): CKTOTAL, CKMB, CKMBINDEX, TROPONINI in the last 168  hours.  BNP: Invalid input(s): POCBNP  CBG: No results for input(s): GLUCAP in the last 168 hours.  Microbiology: Results for orders placed or performed during the hospital encounter of 10/19/19  Respiratory Panel by RT PCR (Flu A&B, Covid) - Nasopharyngeal Swab     Status: None   Collection Time: 10/19/19 11:13 PM   Specimen: Nasopharyngeal Swab  Result Value Ref Range Status   SARS Coronavirus 2 by RT PCR NEGATIVE NEGATIVE Final    Comment: (NOTE) SARS-CoV-2 target nucleic acids are NOT DETECTED.  The SARS-CoV-2 RNA is generally detectable in upper respiratoy specimens during the acute phase of infection. The lowest concentration of SARS-CoV-2 viral copies this assay can detect is 131 copies/mL. A negative result does not preclude SARS-Cov-2 infection and should not be used as the sole basis for treatment or other patient management decisions. A negative result may occur with  improper specimen collection/handling, submission of specimen other than nasopharyngeal swab, presence of viral mutation(s) within the areas targeted by this assay, and inadequate number of viral copies (<131 copies/mL). A negative result must be combined with clinical observations, patient history, and epidemiological information. The expected result is Negative.  Fact Sheet for Patients:  PinkCheek.be  Fact Sheet for Healthcare Providers:  GravelBags.it  This test is no t yet approved or cleared by the [place] FDA  and  has been authorized for detection and/or diagnosis of SARS-CoV-2 by FDA under an Emergency Use Authorization (EUA). This EUA will remain  in effect (meaning this test can be used) for the duration of the COVID-19 declaration under Section 564(b)(1) of the Act, 21 U.S.C. section 360bbb-3(b)(1), unless the authorization is terminated or revoked sooner.     Influenza A by PCR NEGATIVE NEGATIVE Final   Influenza B by PCR  NEGATIVE NEGATIVE Final    Comment: (NOTE) The Xpert Xpress SARS-CoV-2/FLU/RSV assay is intended as an aid in  the diagnosis of influenza from Nasopharyngeal swab specimens and  should not be used as a sole basis for treatment. Nasal washings and  aspirates are unacceptable for Xpert Xpress SARS-CoV-2/FLU/RSV  testing.  Fact Sheet for Patients: PinkCheek.be  Fact Sheet for Healthcare Providers: GravelBags.it  This test is not yet approved or cleared by the [place] FDA and  has been authorized for detection and/or diagnosis of SARS-CoV-2 by  FDA under an Emergency Use Authorization (EUA). This EUA will remain  in effect (meaning this test can be used) for the duration of the  Covid-19 declaration under Section 564(b)(1) of the Act, 21  U.S.C. section 360bbb-3(b)(1), unless the authorization is  terminated or revoked. Performed at [facility], [address] [place], [place] [address]   MRSA PCR Screening     Status: None   Collection Time: 10/25/19  2:50 PM   Specimen: Nasopharyngeal  Result Value Ref Range Status   MRSA by PCR NEGATIVE NEGATIVE Final    Comment:        The GeneXpert MRSA Assay (FDA approved for NASAL specimens only), is one component of a comprehensive MRSA colonization surveillance program. It is not intended to diagnose MRSA infection nor to guide or monitor treatment for MRSA infections. Performed at [facility], [address] [place], [place] [address]   CULTURE, BLOOD (ROUTINE X 2) w Reflex to ID Panel     Status: None   Collection Time: 10/25/19  4:29 PM   Specimen: BLOOD  Result Value Ref Range Status   Specimen Description BLOOD CENTRAL LINE  Final   Special Requests   Final    BOTTLES DRAWN AEROBIC AND ANAEROBIC Blood Culture adequate volume   Culture   Final    NO GROWTH 5 DAYS Performed at [facility], [address] [place], [place]  [address]    Report Status 10/30/2019 FINAL  Final  CULTURE, BLOOD (ROUTINE X 2) w Reflex to ID Panel     Status: None   Collection Time: 10/26/19  6:45 AM   Specimen: BLOOD  Result Value Ref Range Status   Specimen Description BLOOD BLOOD LEFT HAND  Final   Special Requests   Final    BOTTLES DRAWN AEROBIC AND ANAEROBIC Blood Culture adequate volume   Culture   Final    NO GROWTH 5 DAYS Performed at [facility], [address] [place], [place] [address]    Report Status 10/31/2019 FINAL  Final  Body fluid culture     Status: None   Collection Time: 10/26/19 11:30 AM   Specimen: Fluid  Result Value Ref Range Status   Specimen Description   Final    FLUID Performed at [facility], [address] [place], [place] [address]    Special Requests   Final    PERITONEAL Performed at [facility], [address], [place], [place] [address]    Gram Stain  Final    ABUNDANT WBC PRESENT, PREDOMINANTLY PMN NO ORGANISMS SEEN    Culture   Final    NO GROWTH Performed at [facility], [address] [place], [place] [address]    Report Status 10/30/2019 FINAL  Final  Body fluid culture     Status: None   Collection Time: 10/29/19  1:39 PM   Specimen: PATH Cytology Peritoneal fluid  Result Value Ref Range Status   Specimen Description   Final    PERITONEAL Performed at [facility], [address] [place], [place] [address]    Special Requests   Final    NONE Performed at [facility], [address] [place], [place] [address]    Gram Stain   Final    MODERATE WBC PRESENT, PREDOMINANTLY PMN NO ORGANISMS SEEN    Culture   Final    NO GROWTH 3 DAYS Performed at [facility], [place] [address] [place], [place] [address]    Report Status 11/02/2019 FINAL  Final  Gram stain     Status: None   Collection Time: 11/02/19  3:30 PM   Specimen: PATH Cytology Peritoneal fluid  Result Value Ref Range Status   Specimen  Description PERITONEAL  Final   Special Requests NONE  Final   Gram Stain   Final    WBC SEEN RED BLOOD CELLS NO ORGANISMS SEEN Performed at [facility], [address], [place], [place] [address]    Report Status 11/02/2019 FINAL  Final    Coagulation Studies: No results for input(s): LABPROT, INR in the last 72 hours.  Urinalysis: No results for input(s): COLORURINE, LABSPEC, PHURINE, GLUCOSEU, HGBUR, BILIRUBINUR, KETONESUR, PROTEINUR, UROBILINOGEN, NITRITE, LEUKOCYTESUR in the last 72 hours.  Invalid input(s): APPERANCEUR    Imaging: No results found.   Medications:    . apixaban  5 mg Oral BID  . bumetanide  1 mg Oral BID  . ciprofloxacin  500 mg Oral Q breakfast  . epoetin (EPOGEN/PROCRIT) injection  20,000 Units Subcutaneous Weekly  . iron polysaccharides  150 mg Oral Daily  . ketotifen  1 drop Both Eyes BID  . LORazepam  1 mg Intramuscular Once  . mouth rinse  15 mL Mouth Rinse BID  . melatonin  2.5 mg Oral QHS  . midodrine  10 mg Oral TID WC  . multivitamin  1 tablet Oral QHS  . pantoprazole  40 mg Oral BID  . polyethylene glycol  17 g Oral Daily  . spironolactone  25 mg Oral Daily   acetaminophen, albuterol, alum & mag hydroxide-simeth **AND** lidocaine, bisacodyl, calcium carbonate, hydrocortisone cream, hydrOXYzine, loperamide, ondansetron (ZOFRAN) IV, oxyCODONE-acetaminophen  Assessment/ Plan:  Katelyn Lane is a 64 y.o. black female with congestive heart failure, hypertension, hyperlipidemia, gout, COPD, anemia, cocaine abuse who is admitted to [facility] on 10/19/2019 for Peripheral edema [R60.9] Acute CHF (congestive heart failure) ([place]) [I50.9] AKI (acute kidney injury) ([place]) [N17.9] Congestive heart failure, unspecified HF chronicity, unspecified heart failure type ([place]) [S56.8] Acute diastolic (congestive) heart failure ([place]) [I50.31]  # Acute kidney injury on chronic kidney disease stage IIIB.   #Volume Overload   Lab Results   Component Value Date   CREATININE 3.38 (H) 12/25/2019   CREATININE 3.35 (H) 12/24/2019   CREATININE 2.87 (H) 12/22/2019  Renal function getting gradually and progressively worse Patient reports voiding She has 2+ tight peripheral edema Continue Bumex and Spironolactone No acute indication for dialysis, also patient refused dialysis treatments  previously during this admission We will continue monitoring renal function closely  2. Hypotension with acute exacerbation of systolic and diastolic congestive heart failure. Echocardiogram 06/22/19 EF of 25-30%. SOB at baseline requiring 2L supplemental O2  Blood pressure readings within acceptable range Continue Midodrine   3. Anemia of Chronic Disease Lab Results  Component Value Date   HGB 9.5 (L) 12/23/2019  Patient is on Epogen 20,000 units [place] weekly   LOS: [address] 12/17/202112:17 PM

## 2019-12-25 NOTE — Progress Notes (Signed)
PROGRESS NOTE    Katelyn Lane  VVO:[id number] DOB: [birth year] DOA: 10/19/2019 PCP: [name], MD   Brief Narrative: Katelyn Lane is a 64 y.o. 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 After admission to the hospital, she was diagnosed with acute on chronic combinedsystolic and diastolic congestive heart failurewith ejection fraction 25 to 30%. She was not responding to IV Lasix. She was seen by nephrology, temporary dialysis catheter was performed and she was dialyzed on 10/17 and 10/18.Patient also complaining of abdominal pain, she had a paracentesis, study showed possible spontaneous peritonitis, she was treated with Rocephin and Flagyl. Patient had a worsening respiratory status on 10/17, was diagnosed with aspiration pneumonia. She was treated with Rocephin, Zithromax and Flagyl.Patient also had abdominal pain and a CT scan suspect ischemia, but seen by general surgery on 10/19, no need for surgery. Patient was evaluated by psychiatry and she does not have decision-making capacity. Family does not want to participate in making decisions for her. DSS case was filed for guardianship and still pending. Difficult disposition due to unsafe discharge. Nephrology team is also following.  She was started on IV Lasix and spironolactone for volume overload.  Currently on Bumex and spironolactone  Assessment & Plan:   Principal Problem:   Spontaneous bacterial peritonitis ([place]) Active Problems:   AKI (acute kidney injury) ([place])   Crack cocaine use   Acute respiratory failure with hypoxia (HCC)   LV (left ventricular) mural thrombus   Chronic kidney disease (CKD) stage  G3b/A1, moderately decreased glomerular filtration rate (GFR) between 30-44 mL/min/1.73 square meter and albuminuria creatinine ratio less than 30 mg/g (HCC)   Nonsustained ventricular tachycardia (HCC)   Acute CHF (congestive heart failure) (HCC)   Cocaine abuse ([place])   Peripheral edema   Cirrhosis of liver (HCC)   Dementia (HCC)   Aspiration pneumonia of both lower lobes due to gastric secretions (HCC)   Acute diastolic (congestive) heart failure ([place])   Cardiorenal disease   Goals of care, counseling/discussion   Palliative care by specialist   Ascites due to alcoholic cirrhosis ([place])   Liver cirrhosis with ascites complicated by SBP: s/p paracentesis x2 (11/02/2019 and 12/15/2019).  Paracentesis could not be performed on 12/17/2019 because there was not enough fluid on abdominal ultrasound for safe paracentesis.     Completed antibiotics for SBP. Continue ciprofloxacin for SBP prophylaxis. Patient says she has abdominal discomfort and that has made her uncomfortable.  We will try ultrasound-guided paracentesis today if there is enough ascites fluid.  Acute on chronic combined systolic and diastolic CHF: echo on 2/69/48 shows EF of 25-30%, grade III diastolic dysfunction. . Received HD x 2 but does not want HD again.    She looked extremely volume overloaded and had severe  peripheral edema.  IV lasix now D/ced . Continue bumex  and spironolactone.  Nephrology also following   Acute hypoxemic respiratory failure: On room air today.  Lungs are clear on auscultation.  Hypoxia could be from congestion from volume overload.  Hypotension: Borderline low BP.  Continue midodrine  AKI on CKDIIIb: Creatinine in the range of 3.  Nephrology following.  Continue monitoring.  Continue diuresis  Left ventricular thrombus:w/ hx of CVA.  Continue Eliquis  Overgrown toenails: Follow-up with podiatrist  as an outpatient.  Anemia of chronic disease:continue on iron supplements.H&H are  stable  Hypokalemia: Being monitored and supplemented  Depression/insomnia: Trazodone was discontinued on 12/17/2019 per patient's request  Vaginal Itching, hyponatremia, thrombocytosis:resolved.  Decision Making Capacity: psych does not believe the pt has the pt has the capacity to make rational decision in regards to formulating a discharge plan. No evidence of imminent risk to self or others currently as per psych  Morbid obesity: BMI of 35.5  Other comorbidities include GERD, constipation and depression, insomnia  Nutrition Problem: Increased nutrient needs Etiology: chronic illness (cirrhosis, CHF)      DVT prophylaxis:Eliquis Code Status: DNR Family Communication: None at the bedside Status is: Inpatient  Remains inpatient appropriate because:Unsafe d/c plan   Dispo: The patient is from: Home              Anticipated d/c is to: SNF              Anticipated d/c date is: > 3 days          Patient currently is medically stable for discharge.   consultants: Nephrology  Procedures:None  Antimicrobials:  Anti-infectives (From admission, onward)   Start     Dose/Rate Route Frequency Ordered Stop   11/14/19 0800  ciprofloxacin (CIPRO) tablet 500 mg        500 mg Oral Daily with breakfast 11/13/19 1938     11/01/19 1000  cefTRIAXone (ROCEPHIN) 2 g in sodium chloride 0.9 % 100 mL IVPB  Status:  Discontinued        2 g 200 mL/hr over 30 Minutes Intravenous Daily 10/31/19 1521 11/09/19 1425   10/27/19 1000  cefTRIAXone (ROCEPHIN) 2 g in sodium chloride 0.9 % 100 mL IVPB  Status:  Discontinued        2 g 200 mL/hr over 30 Minutes Intravenous Daily 10/26/19 1559 10/31/19 1521   10/26/19 2000  cefTRIAXone (ROCEPHIN) 1 g in sodium chloride 0.9 % 100 mL IVPB        1 g 200 mL/hr over 30 Minutes Intravenous  Once 10/26/19 1559 10/26/19 2024   10/26/19 1600  cefTRIAXone (ROCEPHIN) 2 g in sodium chloride 0.9 % 100 mL IVPB  Status:  Discontinued        2 g 200 mL/hr over 30  Minutes Intravenous Every 24 hours 10/26/19 1351 10/26/19 1600   10/26/19 1000  metroNIDAZOLE (FLAGYL) IVPB 500 mg  Status:  Discontinued        500 mg 100 mL/hr over 60 Minutes Intravenous Every 8 hours 10/26/19 0909 10/29/19 1138   10/25/19 1445  azithromycin (ZITHROMAX) 500 mg in sodium chloride 0.9 % 250 mL IVPB  Status:  Discontinued        500 mg 250 mL/hr over 60 Minutes Intravenous Every 24 hours 10/25/19 1353 10/29/19 1138   10/25/19 1230  azithromycin (ZITHROMAX) tablet 500 mg  Status:  Discontinued        500 mg Oral Daily 10/25/19 1133 10/25/19 1134   10/25/19 1030  cefTRIAXone (ROCEPHIN) 1 g in sodium chloride 0.9 % 100 mL IVPB  Status:  Discontinued        1 g 200 mL/hr over 30 Minutes Intravenous Every 12 hours 10/25/19 0931 10/26/19 1351      Subjective:  Patient seen and examined at the bedside this morning.  She was sitting  at the edge of the bed.  Comfortable.  Complains of pain on the left side of her body.  She is very  concerned about fluid buildup in her abdomen  and requesting for paracentesis..  Objective: Vitals:   12/24/19 1537 12/24/19 2000 12/25/19 0014 12/25/19 0455  BP: 117/77 111/89 103/82 107/83  Pulse: 75 60 63 69  Resp: 18 20  16   Temp: 98 F (36.7 C) 97.6 F (36.4 C)  97.7 F (36.5 C)  TempSrc: Oral Oral  Oral  SpO2: 100% 100% 97% 95%  Weight:      Height:        Intake/Output Summary (Last 24 hours) at 12/25/2019 0830 Last data filed at 12/24/2019 1902 Gross per 24 hour  Intake 360 ml  Output --  Net 360 ml   Filed Weights   12/20/19 0500 12/21/19 0740 12/22/19 0400  Weight: 101.2 kg 99.7 kg 99.8 kg    Examination:   General exam: Overall comfortable, deconditioned,obese HEENT:PERRL,Oral mucosa moist, Ear/Nose normal on gross exam Respiratory system: Bilateral equal air entry, normal vesicular breath sounds, no wheezes or crackles  Cardiovascular system: S1 & S2 heard, RRR. No JVD, murmurs, rubs, gallops or  clicks. Gastrointestinal system: Abdomen is distended, soft and nontender. No organomegaly or masses felt. Normal bowel sounds heard. Central nervous system: Alert and oriented. No focal neurological deficits. Extremities: Anasarca, no clubbing ,no cyanosis Skin: No rashes, lesions or ulcers,no icterus ,no pallor    Data Reviewed: I have personally reviewed following labs and imaging studies  CBC: Recent Labs  Lab 12/20/19 0510 12/23/19 0606  WBC 4.9 5.1  HGB 9.3* 9.5*  HCT 32.5* 31.6*  MCV 77.9* 74.0*  PLT 371 030   Basic Metabolic Panel: Recent Labs  Lab 12/20/19 0510 12/21/19 0437 12/22/19 0412 12/24/19 0449 12/25/19 0518  NA 137 137 140 140 140  K 4.3 4.2 3.8 4.1 3.7  CL 100 99 101 103 102  CO2 28 27 28 27 26   GLUCOSE 90 88 109* 116* 89  BUN 58* 60* 57* 58* 57*  CREATININE 2.63* 2.76* 2.87* 3.35* 3.38*  CALCIUM 9.1 9.2 9.6 9.2 9.1  MG 1.9 1.9 2.0  --   --    GFR: Estimated Creatinine Clearance: 20 mL/min (A) (by C-G formula based on SCr of 3.38 mg/dL (H)). Liver Function Tests: No results for input(s): AST, ALT, ALKPHOS, BILITOT, PROT, ALBUMIN in the last 168 hours. No results for input(s): LIPASE, AMYLASE in the last 168 hours. No results for input(s): AMMONIA in the last 168 hours. Coagulation Profile: No results for input(s): INR, PROTIME in the last 168 hours. Cardiac Enzymes: No results for input(s): CKTOTAL, CKMB, CKMBINDEX, TROPONINI in the last 168 hours. BNP (last 3 results) No results for input(s): PROBNP in the last 8760 hours. HbA1C: No results for input(s): HGBA1C in the last 72 hours. CBG: No results for input(s): GLUCAP in the last 168 hours. Lipid Profile: No results for input(s): CHOL, HDL, LDLCALC, TRIG, CHOLHDL, LDLDIRECT in the last 72 hours. Thyroid Function Tests: No results for input(s): TSH, T4TOTAL, FREET4, T3FREE, THYROIDAB in the last 72 hours. Anemia Panel: No results for input(s): VITAMINB12, FOLATE, FERRITIN, TIBC, IRON,  RETICCTPCT in the last 72 hours. Sepsis Labs: No results for input(s): PROCALCITON, LATICACIDVEN in the last 168 hours.  No results found for this or any previous visit (from the past 240 hour(s)).       Radiology Studies: No results found.      Scheduled Meds: . apixaban  5 mg Oral BID  . bumetanide  1 mg Oral BID  . ciprofloxacin  500 mg Oral Q breakfast  .  epoetin (EPOGEN/PROCRIT) injection  20,000 Units Subcutaneous Weekly  . iron polysaccharides  150 mg Oral Daily  . ketotifen  1 drop Both Eyes BID  . LORazepam  1 mg Intramuscular Once  . mouth rinse  15 mL Mouth Rinse BID  . melatonin  2.5 mg Oral QHS  . midodrine  10 mg Oral TID WC  . multivitamin  1 tablet Oral QHS  . pantoprazole  40 mg Oral BID  . polyethylene glycol  17 g Oral Daily  . spironolactone  25 mg Oral Daily   Continuous Infusions:   LOS: 67 days    Time spent: 25 mins.More than 50% of that time was spent in counseling and/or coordination of care.      Shelly Coss, MD [facility] P12/17/2021, 8:30 AM

## 2019-12-26 LAB — BASIC METABOLIC PANEL
Anion gap: 11 (ref 5–15)
BUN: 59 mg/dL — ABNORMAL HIGH (ref 8–23)
CO2: 26 mmol/L (ref 22–32)
Calcium: 8.9 mg/dL (ref 8.9–10.3)
Chloride: 101 mmol/L (ref 98–111)
Creatinine, Ser: 3.52 mg/dL — ABNORMAL HIGH (ref 0.44–1.00)
GFR, Estimated: 14 mL/min — ABNORMAL LOW (ref >60)
Glucose, Bld: 113 mg/dL — ABNORMAL HIGH (ref 70–99)
Potassium: 4.3 mmol/L (ref 3.5–5.1)
Sodium: 138 mmol/L (ref 135–145)

## 2019-12-26 LAB — CBC
HCT: 32.5 % — ABNORMAL LOW (ref 36.0–46.0)
Hemoglobin: 9.6 g/dL — ABNORMAL LOW (ref 12.0–15.0)
MCH: 22.7 pg — ABNORMAL LOW (ref 26.0–34.0)
MCHC: 29.5 g/dL — ABNORMAL LOW (ref 30.0–36.0)
MCV: 76.8 fL — ABNORMAL LOW (ref 80.0–100.0)
Platelets: 334 10*3/uL (ref 150–400)
RBC: 4.23 MIL/uL (ref 3.87–5.11)
RDW: 20.3 % — ABNORMAL HIGH (ref 11.5–15.5)
WBC: 5.1 10*3/uL (ref 4.0–10.5)
nRBC: 0 % (ref 0.0–0.2)

## 2019-12-26 MED ORDER — POLYVINYL ALCOHOL 1.4 % OP SOLN
1.0000 [drp] | OPHTHALMIC | Status: DC | PRN
  Administered 2019-12-26 – 2020-01-12 (×6): 1 [drp] via OPHTHALMIC
  Filled 2019-12-26: qty 15

## 2019-12-26 MED ORDER — BUMETANIDE 1 MG PO TABS
2.0000 mg | ORAL_TABLET | Freq: Two times a day (BID) | ORAL | Status: DC
  Administered 2019-12-26 – 2020-01-29 (×68): 2 mg via ORAL
  Filled 2019-12-26 (×70): qty 2

## 2019-12-26 NOTE — Progress Notes (Signed)
PROGRESS NOTE    Katelyn Lane  EEF:[id number] DOB: 1955-07-15 DOA: 10/19/2019 PCP: [name], MD   Brief Narrative: Katelyn Lane is a 64 y.o. 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 After admission to the hospital, she was diagnosed with acute on chronic combinedsystolic and diastolic congestive heart failurewith ejection fraction 25 to 30%. She was not responding to IV Lasix. She was seen by nephrology, temporary dialysis catheter was performed and she was dialyzed on 10/17 and 10/18.Patient also complaining of abdominal pain, she had a paracentesis, study showed possible spontaneous peritonitis, she was treated with Rocephin and Flagyl. Patient had a worsening respiratory status on 10/17, was diagnosed with aspiration pneumonia. She was treated with Rocephin, Zithromax and Flagyl.Patient also had abdominal pain and a CT scan suspect ischemia, but seen by general surgery on 10/19, no need for surgery. Patient was evaluated by psychiatry and she does not have decision-making capacity. Family does not want to participate in making decisions for her. DSS case was filed for guardianship and still pending. Difficult disposition due to unsafe discharge. Nephrology team is also following.  She was started on IV Lasix and spironolactone for volume overload.  Currently on Bumex and spironolactone  Assessment & Plan:   Principal Problem:   Spontaneous bacterial peritonitis ([place]) Active Problems:   AKI (acute kidney injury) ([place])   Crack cocaine use   Acute respiratory failure with hypoxia (HCC)   LV (left ventricular) mural thrombus   Chronic kidney disease (CKD) stage  G3b/A1, moderately decreased glomerular filtration rate (GFR) between 30-44 mL/min/1.73 square meter and albuminuria creatinine ratio less than 30 mg/g (HCC)   Nonsustained ventricular tachycardia (HCC)   Acute CHF (congestive heart failure) (HCC)   Cocaine abuse ([place])   Peripheral edema   Cirrhosis of liver (HCC)   Dementia (HCC)   Aspiration pneumonia of both lower lobes due to gastric secretions (HCC)   Acute diastolic (congestive) heart failure ([place])   Cardiorenal disease   Goals of care, counseling/discussion   Palliative care by specialist   Ascites due to alcoholic cirrhosis ([place])   Liver cirrhosis with ascites complicated by SBP: s/p paracentesis x2 (11/02/2019 and 12/15/2019).  Paracentesis could not be performed on 12/17/2019 because there was not enough fluid on abdominal ultrasound for safe paracentesis.     Completed antibiotics for SBP. Continue ciprofloxacin for SBP prophylaxis. Patient says she has abdominal discomfort and that has made her uncomfortable.  We ordered ultrasound-guided paracentesis on 12/25/19  Acute on chronic combined systolic and diastolic CHF: echo on 8/83/25 shows EF of 25-30%, grade III diastolic dysfunction. . Received HD x 2 but does not want HD again.    She looked extremely volume overloaded and had severe  peripheral edema.  IV lasix now D/ced . Continue bumex  and spironolactone.  Nephrology also following   Acute hypoxemic respiratory failure: On room air today.  Lungs are clear on auscultation.  Hypoxia could be from congestion from volume overload.  Hypotension: Borderline low BP.  Continue midodrine  AKI on CKDIIIb: Creatinine in the range of 3.  Nephrology following.  Continue monitoring.  Continue diuresis  Left ventricular thrombus:w/ hx of CVA.  Continue Eliquis  Overgrown toenails: Follow-up with podiatrist as an outpatient.  Anemia of  chronic disease:continue on iron supplements.H&H are stable  Hypokalemia: Being  monitored and supplemented  Depression/insomnia: Trazodone was discontinued on 12/17/2019 per patient's request  Vaginal Itching, hyponatremia, thrombocytosis:resolved.  Decision Making Capacity: psych does not believe the pt has the pt has the capacity to make rational decision in regards to formulating a discharge plan. No evidence of imminent risk to self or others currently as per psych  Morbid obesity: BMI of 35.5  Other comorbidities include GERD, constipation and depression, insomnia  Nutrition Problem: Increased nutrient needs Etiology: chronic illness (cirrhosis, CHF)      DVT prophylaxis:Eliquis Code Status: DNR Family Communication: None at the bedside Status is: Inpatient  Remains inpatient appropriate because:Unsafe d/c plan   Dispo: The patient is from: Home              Anticipated d/c is to: SNF              Anticipated d/c date is: > 3 days          Patient currently is medically stable for discharge.   consultants: Nephrology  Procedures:None  Antimicrobials:  Anti-infectives (From admission, onward)   Start     Dose/Rate Route Frequency Ordered Stop   11/14/19 0800  ciprofloxacin (CIPRO) tablet 500 mg        500 mg Oral Daily with breakfast 11/13/19 1938     11/01/19 1000  cefTRIAXone (ROCEPHIN) 2 g in sodium chloride 0.9 % 100 mL IVPB  Status:  Discontinued        2 g 200 mL/hr over 30 Minutes Intravenous Daily 10/31/19 1521 11/09/19 1425   10/27/19 1000  cefTRIAXone (ROCEPHIN) 2 g in sodium chloride 0.9 % 100 mL IVPB  Status:  Discontinued        2 g 200 mL/hr over 30 Minutes Intravenous Daily 10/26/19 1559 10/31/19 1521   10/26/19 2000  cefTRIAXone (ROCEPHIN) 1 g in sodium chloride 0.9 % 100 mL IVPB        1 g 200 mL/hr over 30 Minutes Intravenous  Once 10/26/19 1559 10/26/19 2024   10/26/19 1600  cefTRIAXone (ROCEPHIN) 2 g in sodium chloride 0.9 % 100 mL IVPB  Status:  Discontinued        2 g 200 mL/hr over 30 Minutes Intravenous Every 24  hours 10/26/19 1351 10/26/19 1600   10/26/19 1000  metroNIDAZOLE (FLAGYL) IVPB 500 mg  Status:  Discontinued        500 mg 100 mL/hr over 60 Minutes Intravenous Every 8 hours 10/26/19 0909 10/29/19 1138   10/25/19 1445  azithromycin (ZITHROMAX) 500 mg in sodium chloride 0.9 % 250 mL IVPB  Status:  Discontinued        500 mg 250 mL/hr over 60 Minutes Intravenous Every 24 hours 10/25/19 1353 10/29/19 1138   10/25/19 1230  azithromycin (ZITHROMAX) tablet 500 mg  Status:  Discontinued        500 mg Oral Daily 10/25/19 1133 10/25/19 1134   10/25/19 1030  cefTRIAXone (ROCEPHIN) 1 g in sodium chloride 0.9 % 100 mL IVPB  Status:  Discontinued        1 g 200 mL/hr over 30 Minutes Intravenous Every 12 hours 10/25/19 0931 10/26/19 1351      Subjective:  Patient seen and examined at the bedside this morning.  Medically stable.  Sitting on the potty.  Still has significant anasarca.  She says she wants to move and ambulate  Objective: Vitals:   12/25/19 1640 12/25/19 2024 12/25/19 2325 12/26/19  0634  BP:  114/86 108/87 (!) 128/93  Pulse: 72 72 68 78  Resp:  17 16 17   Temp:  97.9 F (36.6 C) 97.6 F (36.4 C) 97.6 F (36.4 C)  TempSrc:  Oral Oral   SpO2: 100% 100% 100% 100%  Weight:      Height:        Intake/Output Summary (Last 24 hours) at 12/26/2019 0754 Last data filed at 12/25/2019 1856 Gross per 24 hour  Intake 720 ml  Output --  Net 720 ml   Filed Weights   12/20/19 0500 12/21/19 0740 12/22/19 0400  Weight: 101.2 kg 99.7 kg 99.8 kg    Examination:   General exam: Overall comfortable, morbidly obese, anasarca Respiratory system: Bilateral equal air entry, normal vesicular breath sounds, no wheezes or crackles  Cardiovascular system: S1 & S2 heard, RRR. No JVD, murmurs, rubs, gallops or clicks. Gastrointestinal system: Abdomen is distended, soft and nontender Central nervous system: Alert and oriented. No focal neurological deficits. Extremities: Anasarca, no clubbing  ,no cyanosis Skin: Erythematous skin, no ulcers,no icterus ,no pallor    Data Reviewed: I have personally reviewed following labs and imaging studies  CBC: Recent Labs  Lab 12/20/19 0510 12/23/19 0606 12/26/19 0524  WBC 4.9 5.1 5.1  HGB 9.3* 9.5* 9.6*  HCT 32.5* 31.6* 32.5*  MCV 77.9* 74.0* 76.8*  PLT 371 376 144   Basic Metabolic Panel: Recent Labs  Lab 12/20/19 0510 12/21/19 0437 12/22/19 0412 12/24/19 0449 12/25/19 0518 12/26/19 0524  NA 137 137 140 140 140 138  K 4.3 4.2 3.8 4.1 3.7 4.3  CL 100 99 101 103 102 101  CO2 28 27 28 27 26 26   GLUCOSE 90 88 109* 116* 89 113*  BUN 58* 60* 57* 58* 57* 59*  CREATININE 2.63* 2.76* 2.87* 3.35* 3.38* 3.52*  CALCIUM 9.1 9.2 9.6 9.2 9.1 8.9  MG 1.9 1.9 2.0  --   --   --    GFR: Estimated Creatinine Clearance: 19.2 mL/min (A) (by C-G formula based on SCr of 3.52 mg/dL (H)). Liver Function Tests: No results for input(s): AST, ALT, ALKPHOS, BILITOT, PROT, ALBUMIN in the last 168 hours. No results for input(s): LIPASE, AMYLASE in the last 168 hours. No results for input(s): AMMONIA in the last 168 hours. Coagulation Profile: No results for input(s): INR, PROTIME in the last 168 hours. Cardiac Enzymes: No results for input(s): CKTOTAL, CKMB, CKMBINDEX, TROPONINI in the last 168 hours. BNP (last 3 results) No results for input(s): PROBNP in the last 8760 hours. HbA1C: No results for input(s): HGBA1C in the last 72 hours. CBG: No results for input(s): GLUCAP in the last 168 hours. Lipid Profile: No results for input(s): CHOL, HDL, LDLCALC, TRIG, CHOLHDL, LDLDIRECT in the last 72 hours. Thyroid Function Tests: No results for input(s): TSH, T4TOTAL, FREET4, T3FREE, THYROIDAB in the last 72 hours. Anemia Panel: No results for input(s): VITAMINB12, FOLATE, FERRITIN, TIBC, IRON, RETICCTPCT in the last 72 hours. Sepsis Labs: No results for input(s): PROCALCITON, LATICACIDVEN in the last 168 hours.  No results found for this or  any previous visit (from the past 240 hour(s)).       Radiology Studies: No results found.      Scheduled Meds: . apixaban  5 mg Oral BID  . bumetanide  1 mg Oral BID  . ciprofloxacin  500 mg Oral Q breakfast  . epoetin (EPOGEN/PROCRIT) injection  20,000 Units Subcutaneous Weekly  . iron polysaccharides  150 mg Oral Daily  .  ketotifen  1 drop Both Eyes BID  . LORazepam  1 mg Intramuscular Once  . mouth rinse  15 mL Mouth Rinse BID  . melatonin  2.5 mg Oral QHS  . midodrine  10 mg Oral TID WC  . multivitamin  1 tablet Oral QHS  . pantoprazole  40 mg Oral BID  . polyethylene glycol  17 g Oral Daily  . spironolactone  25 mg Oral Daily   Continuous Infusions:   LOS: 68 days    Time spent: 25 mins.More than 50% of that time was spent in counseling and/or coordination of care.      Shelly Coss, MD [facility] P12/18/2021, 7:54 AM

## 2019-12-26 NOTE — Progress Notes (Signed)
Katelyn Lane  MRN: [id number]  DOB/AGE: 09/21/1955 64 y.o.  Primary Care Physician:Revelo, Katelyn Jarvis, MD  Admit date: 10/19/2019  Chief Complaint:  Chief Complaint  Patient presents with  . Leg Swelling    S-Pt presented on  10/19/2019 with  Chief Complaint  Patient presents with  . Leg Swelling  . Patient main concern today's visit was my swelling is not going down.  Patient informed me that they took the fluid off my belly yesterday.I then discussed with the patient about dialysis.  Patient again mentioned that she does not want dialysis.    Medications . apixaban  5 mg Oral BID  . bumetanide  1 mg Oral BID  . ciprofloxacin  500 mg Oral Q breakfast  . epoetin (EPOGEN/PROCRIT) injection  20,000 Units Subcutaneous Weekly  . iron polysaccharides  150 mg Oral Daily  . ketotifen  1 drop Both Eyes BID  . LORazepam  1 mg Intramuscular Once  . mouth rinse  15 mL Mouth Rinse BID  . melatonin  2.5 mg Oral QHS  . midodrine  10 mg Oral TID WC  . multivitamin  1 tablet Oral QHS  . pantoprazole  40 mg Oral BID  . polyethylene glycol  17 g Oral Daily  . spironolactone  25 mg Oral Daily         VOZ:DGUYQ from the symptoms mentioned above,there are no other symptoms referable to all systems reviewed.  Physical Exam: Vital signs in last 24 hours: Temp:  [97.6 F (36.4 C)-97.9 F (36.6 C)] 97.7 F (36.5 C) (12/18 0843) Pulse Rate:  [68-78] 68 (12/18 0843) Resp:  [16-18] 18 (12/18 0843) BP: (103-128)/(86-93) 110/92 (12/18 0843) SpO2:  [98 %-100 %] 98 % (12/18 0843) Weight change:  Last BM Date: 12/25/19  Intake/Output from previous day: 12/17 0701 - 12/18 0700 In: 720 [P.O.:720] Out: -  No intake/output data recorded.   Physical Exam:   General- pt is awake,alert, oriented to time place and person  Resp- No acute REsp distress, CTA B/L NO Rhonchi  CVS- S1S2 regular in rate and rhythm  GIT- BS+, soft, Non tender , Non distended  EXT- 2--3+ LE Edema,  NO Cyanosis    Lab Results:  CBC  Recent Labs    12/26/19 0524  WBC 5.1  HGB 9.6*  HCT 32.5*  PLT 334    BMET  Recent Labs    12/25/19 0518 12/26/19 0524  NA 140 138  K 3.7 4.3  CL 102 101  CO2 26 26  GLUCOSE 89 113*  BUN 57* 59*  CREATININE 3.38* 3.52*  CALCIUM 9.1 8.9      Most recent Creatinine trend  Lab Results  Component Value Date   CREATININE 3.52 (H) 12/26/2019   CREATININE 3.38 (H) 12/25/2019   CREATININE 3.35 (H) 12/24/2019    Creat trend 2021 1.8--4.0( Multiple admissions)  2020 1.7--2.3 ( Multiple admissions)  2014 1.54  MICRO   No results found for this or any previous visit (from the past 240 hour(s)).       Impression:   [name] is a 64 y.o. black female with congestive heart failure, hypertension, hyperlipidemia, gout, COPD, anemia, cocaine abuse who is admitted to [facility] on 10/19/2019 for Peripheral edema [R60.9] Acute CHF (congestive heart failure) . AKI (acute kidney injury) ( Congestive heart failure, unspecified HF chronicity, unspecified heart failure type .  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 CKD  secondary to HTN Progression of CKD marked with multiple AKI   Patient did require dialysis during this admission Patient had permacath placed as well Patient GFR thereafter remained stable Patient tunnel catheter was removed  Even after multiple attempts/question/brief addressing the point patient is sure that she does not want renal replacement therapy/dialysis       2)hypotension  Blood pressure is stable  Patient is on midodrine  3)Anemia of chronic disease  CBC Latest Ref Rng & Units 12/26/2019 12/23/2019 12/20/2019  WBC 4.0 - 10.5 K/uL 5.1 5.1 4.9  Hemoglobin 12.0 - 15.0 g/dL 9.6(L) 9.5(L) 9.3(L)  Hematocrit 36.0 - 46.0 % 32.5(L) 31.6(L) 32.5(L)  Platelets 150 - 400 K/uL 334 376 371       HGb is at goal (9--11)    Patient is on Epogen  4)  Secondary hyperparathyroidism -CKD Mineral-Bone Disorder    Lab Results  Component Value Date   CALCIUM 8.9 12/26/2019   PHOS 3.6 11/26/2019    Secondary Hyperparathyroidism absent.  Phosphorus at goal.   5) acute on chronic combined systolic and diastolic congestive heart failure.   Echocardiogram 06/22/19 EF of 25-30%.  -Blood pressure readings stays at the low normal range  -Continue Bumex+ spironolactone   Patient edema has worsened from past few days Patient was earlier IV diuretics which was changed to p.o. as patient was having rising creatinine We will increase the dose of Bumex from 1 mg p.o. twice daily to 2 mg p.o. twice daily   6) Electrolytes   BMP Latest Ref Rng & Units 12/26/2019 12/25/2019 12/24/2019  Glucose 70 - 99 mg/dL 113(H) 89 116(H)  BUN 8 - 23 mg/dL 59(H) 57(H) 58(H)  Creatinine 0.44 - 1.00 mg/dL 3.52(H) 3.38(H) 3.35(H)  Sodium 135 - 145 mmol/L 138 140 140  Potassium 3.5 - 5.1 mmol/L 4.3 3.7 4.1  Chloride 98 - 111 mmol/L 101 102 103  CO2 22 - 32 mmol/L 26 26 27   Calcium 8.9 - 10.3 mg/dL 8.9 9.1 9.2     Sodium Normonatremic   Potassium Normokalemic    7)Acid base Co2 at goal  8) Ascites S/p tap  Primary and GI  team managing    Plan:   We will increase bumex to 2mg  po bid Will ask for daily standing weights      Katelyn Lane s Hansen Carino 12/26/2019, 9:58 AM

## 2019-12-26 NOTE — Progress Notes (Signed)
[place] visited pt. after pt. called [place] into her Rm., observing [place] in hallway looking for RN.  Pt. says she had been having trouble getting staff to cover her feet with a blanket, but that she is now comfortable and has no further needs at this time.  CH remains available as needed.

## 2019-12-27 LAB — BASIC METABOLIC PANEL
Anion gap: 10 (ref 5–15)
BUN: 58 mg/dL — ABNORMAL HIGH (ref 8–23)
CO2: 25 mmol/L (ref 22–32)
Calcium: 9.2 mg/dL (ref 8.9–10.3)
Chloride: 101 mmol/L (ref 98–111)
Creatinine, Ser: 3.39 mg/dL — ABNORMAL HIGH (ref 0.44–1.00)
GFR, Estimated: 15 mL/min — ABNORMAL LOW (ref >60)
Glucose, Bld: 91 mg/dL (ref 70–99)
Potassium: 4.4 mmol/L (ref 3.5–5.1)
Sodium: 136 mmol/L (ref 135–145)

## 2019-12-27 NOTE — TOC Progression Note (Signed)
Transition of Care [facility]) - Progression Note    Patient Details  Name: Katelyn Lane MRN: [id number] Date of Birth: September 07, 1955  Transition of Care [facility]) CM/SW Contact  Izola Price, RN Phone Number: 12/27/2019, 5:34 PM  Clinical Narrative:    12/19 PT rec. RW DME today. DSS case. Simmie Davies RN CM         Expected Discharge Plan and Services                                                 Social Determinants of Health (SDOH) Interventions    Readmission Risk Interventions Readmission Risk Prevention Plan 09/02/2019 08/14/2019 07/27/2019  Transportation Screening Complete Complete Complete  Medication Review [employer] photographer) Complete Referral to Pharmacy -  PCP or Specialist appointment within 3-5 days of discharge Complete Complete Complete  HRI or Home Care Consult - Complete Complete  SW Recovery Care/Counseling Consult - Complete Complete  Palliative Care Screening Not Applicable Not Applicable Not Applicable  Skilled Nursing Facility Complete Not Applicable Not Applicable  Some recent data might be hidden

## 2019-12-27 NOTE — Evaluation (Signed)
Physical Therapy Evaluation Patient Details Name: Katelyn Lane MRN: [id number] DOB: 12-Jun-1955 Today's Date: 12/27/2019   History of Present Illness  presented to ER secondary to LE edema, progressive SOB; admitted for management of acute/chronic CHF, spontaneous bacterial peritonitis (s/p paracentesis 10/25).  Hospital course also significant for initiation of dialysis (initially, R temp fem cath; converted to R permcath). s/p paracentesis on 12/17    Clinical Impression  PT re-evaluation completed this date. Patient is well known to therapy services. Patient with recent paracentesis on 12/17. Patient was previously re-evaluated by PT on 12/17/19 and was Mod I with mobility with no acute PT needs identified at that time.  Patient currently is Mod I with all functional mobility including transfers and ambulation. Patient ambulated 240ft in hallway with rolling walker, Sp02 96% on room air after walking. Patient educated again on energy conservation technique, pacing for endurance, recognizing need for rest breaks and taking rest breaks as needed, and using rolling walker for safety/fall prevention with ambulation. No further acute PT needs identified at this time. Recommend routine mobility with nursing supervision using rolling walker for ambulation while in the hospital setting.     Follow Up Recommendations No PT follow up    Equipment Recommendations  Rolling walker with 5" wheels;3in1 (PT)    Recommendations for Other Services       Precautions / Restrictions Precautions Precautions: Fall Restrictions Weight Bearing Restrictions: No      Mobility  Bed Mobility               General bed mobility comments: not assessed at this time.    Transfers Overall transfer level: Modified independent   Transfers: Sit to/from Stand Sit to Stand: Modified independent (Device/Increase time)            Ambulation/Gait Ambulation/Gait assistance: Modified independent  (Device/Increase time) Gait Distance (Feet): 250 Feet Assistive device: Rolling walker (2 wheeled) Gait Pattern/deviations: Step-through pattern Gait velocity: WNL   General Gait Details: two short standing rest breaks with ambulation. no loss of balance with use of rolling walker for support. Sp02 96% on room air after walking. educated patient on energy conservation techniques, pacing for endurance as needed.  Stairs            Wheelchair Mobility    Modified Rankin (Stroke Patients Only)       Balance Overall balance assessment: Needs assistance Sitting-balance support: Feet supported;No upper extremity supported Sitting balance-Leahy Scale: Good     Standing balance support: No upper extremity supported;During functional activity Standing balance-Leahy Scale: Good Standing balance comment: patient adjusted hospital gown and reaching outside base of support in standing position without UE support and without loss of balance                             Pertinent Vitals/Pain Pain Assessment: No/denies pain    Home Living Family/patient expects to be discharged to:: Unsure                      Prior Function Level of Independence: Independent               Hand Dominance   Dominant Hand: Left    Extremity/Trunk Assessment   Upper Extremity Assessment Upper Extremity Assessment: Overall WFL for tasks assessed    Lower Extremity Assessment Lower Extremity Assessment: Overall WFL for tasks assessed       Communication   Communication: No difficulties  Cognition Arousal/Alertness: Awake/alert Behavior During Therapy: Impulsive Overall Cognitive Status: Within Functional Limits for tasks assessed                                        General Comments      Exercises     Assessment/Plan    PT Assessment Patent does not need any further PT services  PT Problem List         PT Treatment Interventions       PT Goals (Current goals can be found in the Care Plan section)       Frequency     Barriers to discharge Decreased caregiver support      Co-evaluation               AM-PAC PT "6 Clicks" Mobility  Outcome Measure Help needed turning from your back to your side while in a flat bed without using bedrails?: None Help needed moving from lying on your back to sitting on the side of a flat bed without using bedrails?: None Help needed moving to and from a bed to a chair (including a wheelchair)?: None Help needed standing up from a chair using your arms (e.g., wheelchair or bedside chair)?: None Help needed to walk in hospital room?: None Help needed climbing 3-5 steps with a railing? : A Little 6 Click Score: 23    End of Session   Activity Tolerance: Patient tolerated treatment well Patient left: in chair (eating lunch) Nurse Communication: Mobility status PT Visit Diagnosis: Muscle weakness (generalized) (M62.81)    Time: 1610-9604 PT Time Calculation (min) (ACUTE ONLY): 23 min   Charges:   PT Evaluation $PT Re-evaluation: 1 Re-eval PT Treatments $Therapeutic Activity: 8-22 mins        Katelyn Lane, PT, MPT   Percell Locus 12/27/2019, 12:41 PM

## 2019-12-27 NOTE — Progress Notes (Signed)
Katelyn Lane  MRN: [id number]  DOB/AGE: 1955-10-08 64 y.o.  Primary Care Physician:Revelo, Katelyn Jarvis, MD  Admit date: 10/19/2019  Chief Complaint:  Chief Complaint  Patient presents with  . Leg Swelling    S-Pt presented on  10/19/2019 with  Chief Complaint  Patient presents with  . Leg Swelling  .  Patient informed me today that she was feeling better than yesterday. I again discussed with patient about risk-benefit need of dialysis.  Patient then asked me relevant question about dialysis, patient informed me that she will let me know about this tomorrow.   Medications . apixaban  5 mg Oral BID  . bumetanide  2 mg Oral BID  . ciprofloxacin  500 mg Oral Q breakfast  . epoetin (EPOGEN/PROCRIT) injection  20,000 Units Subcutaneous Weekly  . iron polysaccharides  150 mg Oral Daily  . ketotifen  1 drop Both Eyes BID  . LORazepam  1 mg Intramuscular Once  . mouth rinse  15 mL Mouth Rinse BID  . melatonin  2.5 mg Oral QHS  . midodrine  10 mg Oral TID WC  . multivitamin  1 tablet Oral QHS  . pantoprazole  40 mg Oral BID  . polyethylene glycol  17 g Oral Daily  . spironolactone  25 mg Oral Daily         WUJ:WJXBJ from the symptoms mentioned above,there are no other symptoms referable to all systems reviewed.  Physical Exam: Vital signs in last 24 hours: Temp:  [97.5 F (36.4 C)-98.2 F (36.8 C)] 98.2 F (36.8 C) (12/19 0729) Pulse Rate:  [63-80] 72 (12/19 0729) Resp:  [17-20] 20 (12/19 0729) BP: (103-118)/(77-94) 103/77 (12/19 0729) SpO2:  [97 %-100 %] 100 % (12/19 0729) Weight:  [95.3 kg] 95.3 kg (12/19 0600) Weight change:  Last BM Date: 12/26/19  Intake/Output from previous day: No intake/output data recorded. No intake/output data recorded.   Physical Exam:   General- pt is awake,alert, follows commands  Resp- No acute REsp distress, breath sounds decreased at bases    CVS- S1S2 regular in rate and rhythm  GIT- BS+, soft, Non tender ,  Non distended  EXT- 2+ LE Edema, NO Cyanosis    Lab Results:  CBC  Recent Labs    12/26/19 0524  WBC 5.1  HGB 9.6*  HCT 32.5*  PLT 334    BMET  Recent Labs    12/26/19 0524 12/27/19 0433  NA 138 136  K 4.3 4.4  CL 101 101  CO2 26 25  GLUCOSE 113* 91  BUN 59* 58*  CREATININE 3.52* 3.39*  CALCIUM 8.9 9.2      Most recent Creatinine trend  Lab Results  Component Value Date   CREATININE 3.39 (H) 12/27/2019   CREATININE 3.52 (H) 12/26/2019   CREATININE 3.38 (H) 12/25/2019    Creat trend 2021 1.8--4.0( Multiple admissions)  2020 1.7--2.3 ( Multiple admissions)  2014 1.54  MICRO   No results found for this or any previous visit (from the past 240 hour(s)).       Impression:   [name] is a 64 y.o. black female with congestive heart failure, hypertension, hyperlipidemia, gout, COPD, anemia, cocaine abuse who is admitted to [facility] on 10/19/2019 for Peripheral edema [R60.9] Acute CHF (congestive heart failure) . AKI (acute kidney injury) ( Congestive heart failure, unspecified HF chronicity, unspecified heart failure type .  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 CKD  secondary to HTN Progression of CKD marked with multiple AKI   Patient did require dialysis during this admission Patient had permacath placed as well Patient GFR thereafter remained stable Patient tunnel catheter was removed  I tried again to have a discussion with the patient about dialysis but even after multiple attempts/question/brief addressing the point patient is sure that she does not want renal replacement therapy/dialysis We will continue to follow      2)hypotension  Blood pressure is stable  Patient is on midodrine  3)Anemia of chronic disease  CBC Latest Ref Rng & Units 12/26/2019 12/23/2019 12/20/2019  WBC 4.0 - 10.5 K/uL 5.1 5.1 4.9  Hemoglobin 12.0 - 15.0 g/dL 9.6(L) 9.5(L) 9.3(L)  Hematocrit 36.0 -  46.0 % 32.5(L) 31.6(L) 32.5(L)  Platelets 150 - 400 K/uL 334 376 371       HGb is at goal (9--11)    Patient is on Epogen  4) Secondary hyperparathyroidism -CKD Mineral-Bone Disorder    Lab Results  Component Value Date   CALCIUM 9.2 12/27/2019   PHOS 3.6 11/26/2019    Secondary Hyperparathyroidism absent.  Phosphorus at goal.   5) acute on chronic combined systolic and diastolic congestive heart failure.   Echocardiogram 06/22/19 EF of 25-30%.  -Blood pressure readings stays at the low normal range  -Continue Bumex+ spironolactone   Patient edema has worsened from past few days Patient was earlier IV diuretics which was changed to p.o. as patient was having rising creatinine Yesterday we increased the dose of Bumex from 1 mg p.o. twice daily to 2 mg p.o. twice daily   Reviewed patient's weight today Weight  104 on 12.3.21 95.3 on 12.19.21   6) Electrolytes   BMP Latest Ref Rng & Units 12/27/2019 12/26/2019 12/25/2019  Glucose 70 - 99 mg/dL 91 113(H) 89  BUN 8 - 23 mg/dL 58(H) 59(H) 57(H)  Creatinine 0.44 - 1.00 mg/dL 3.39(H) 3.52(H) 3.38(H)  Sodium 135 - 145 mmol/L 136 138 140  Potassium 3.5 - 5.1 mmol/L 4.4 4.3 3.7  Chloride 98 - 111 mmol/L 101 101 102  CO2 22 - 32 mmol/L 25 26 26   Calcium 8.9 - 10.3 mg/dL 9.2 8.9 9.1     Sodium Normonatremic   Potassium Normokalemic    7)Acid base Co2 at goal  8) Ascites S/p tap  Primary and GI  team managing    Plan:   We will continue current tx       Katelyn Lane s Katelyn Lane 12/27/2019, 8:54 AM

## 2019-12-27 NOTE — Progress Notes (Signed)
PROGRESS NOTE    [name]  SHF:[id number] DOB: 02-May-1955 DOA: 10/19/2019 PCP: [name], MD   Brief Narrative: Katelyn Lane is a 64 y.o. 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 After admission to the hospital, she was diagnosed with acute on chronic combinedsystolic and diastolic congestive heart failurewith ejection fraction 25 to 30%. She was not responding to IV Lasix. She was seen by nephrology, temporary dialysis catheter was performed and she was dialyzed on 10/17 and 10/18.Patient also complaining of abdominal pain, she had a paracentesis, study showed possible spontaneous peritonitis, she was treated with Rocephin and Flagyl. Patient had a worsening respiratory status on 10/17, was diagnosed with aspiration pneumonia. She was treated with Rocephin, Zithromax and Flagyl.Patient also had abdominal pain and a CT scan suspect ischemia, but seen by general surgery on 10/19, no need for surgery. Patient was evaluated by psychiatry and she does not have decision-making capacity. Family does not want to participate in making decisions for her. DSS case was filed for guardianship and still pending. Difficult disposition due to unsafe discharge. Nephrology team is also following.  She was started on IV Lasix and spironolactone for volume overload.  Currently on Bumex and spironolactone  Assessment & Plan:   Principal Problem:   Spontaneous bacterial peritonitis ([place]) Active Problems:   AKI (acute kidney injury) ([place])   Crack cocaine use   Acute respiratory failure with hypoxia (HCC)   LV (left ventricular) mural thrombus   Chronic kidney disease (CKD) stage  G3b/A1, moderately decreased glomerular filtration rate (GFR) between 30-44 mL/min/1.73 square meter and albuminuria creatinine ratio less than 30 mg/g (HCC)   Nonsustained ventricular tachycardia (HCC)   Acute CHF (congestive heart failure) (HCC)   Cocaine abuse ([place])   Peripheral edema   Cirrhosis of liver (HCC)   Dementia (HCC)   Aspiration pneumonia of both lower lobes due to gastric secretions (HCC)   Acute diastolic (congestive) heart failure ([place])   Cardiorenal disease   Goals of care, counseling/discussion   Palliative care by specialist   Ascites due to alcoholic cirrhosis ([place])   Liver cirrhosis with ascites complicated by SBP: s/p paracentesis x2 (11/02/2019 and 12/15/2019).  Paracentesis could not be performed on 12/17/2019 because there was not enough fluid on abdominal ultrasound for safe paracentesis.     Completed antibiotics for SBP. Continue ciprofloxacin for SBP prophylaxis. Patient says she has abdominal discomfort and that has made her uncomfortable.  She underwent ultrasound-guided paracentesis on 12/25/19 but radiology department didnt document how much fluid was taken out.  Acute on chronic combined systolic and diastolic CHF: echo on 05/10/75 shows EF of 25-30%, grade III diastolic dysfunction. . Received HD x 2 but does not want HD again.    She looked extremely volume overloaded and had severe  peripheral edema.  IV lasix now D/ced . Continue bumex  and spironolactone.  Nephrology also following   Acute hypoxemic respiratory failure: On room air today.  Lungs are clear on auscultation.  Hypoxia could be from congestion from volume overload.  Hypotension: Borderline low BP.  Continue midodrine  AKI on CKDIIIb: Creatinine in the range of 3.  Nephrology following.  Continue monitoring.  Continue diuresis  Left ventricular thrombus:w/ hx of CVA.  Continue Eliquis  Overgrown toenails: Follow-up with podiatrist as an outpatient.  Anemia of chronic  disease:continue on iron supplements.H&H are stable  Hypokalemia: Being monitored and supplemented  Depression/insomnia: Trazodone was discontinued on 12/17/2019 per patient's request  Vaginal Itching, hyponatremia, thrombocytosis:resolved.  Decision Making Capacity: psych does not believe the pt has the pt has the capacity to make rational decision in regards to formulating a discharge plan. No evidence of imminent risk to self or others currently as per psych  Morbid obesity: BMI of 35.5  Other comorbidities include GERD, constipation and depression, insomnia  Nutrition Problem: Increased nutrient needs Etiology: chronic illness (cirrhosis, CHF)      DVT prophylaxis:Eliquis Code Status: DNR Family Communication: None at the bedside Status is: Inpatient  Remains inpatient appropriate because:Unsafe d/c plan   Dispo: The patient is from: Home              Anticipated d/c is to: SNF              Anticipated d/c date is: > 3 days          Patient currently is medically stable for discharge.   consultants: Nephrology  Procedures:None  Antimicrobials:  Anti-infectives (From admission, onward)   Start     Dose/Rate Route Frequency Ordered Stop   11/14/19 0800  ciprofloxacin (CIPRO) tablet 500 mg        500 mg Oral Daily with breakfast 11/13/19 1938     11/01/19 1000  cefTRIAXone (ROCEPHIN) 2 g in sodium chloride 0.9 % 100 mL IVPB  Status:  Discontinued        2 g 200 mL/hr over 30 Minutes Intravenous Daily 10/31/19 1521 11/09/19 1425   10/27/19 1000  cefTRIAXone (ROCEPHIN) 2 g in sodium chloride 0.9 % 100 mL IVPB  Status:  Discontinued        2 g 200 mL/hr over 30 Minutes Intravenous Daily 10/26/19 1559 10/31/19 1521   10/26/19 2000  cefTRIAXone (ROCEPHIN) 1 g in sodium chloride 0.9 % 100 mL IVPB        1 g 200 mL/hr over 30 Minutes Intravenous  Once 10/26/19 1559 10/26/19 2024   10/26/19 1600  cefTRIAXone (ROCEPHIN) 2 g in sodium chloride 0.9 % 100 mL IVPB   Status:  Discontinued        2 g 200 mL/hr over 30 Minutes Intravenous Every 24 hours 10/26/19 1351 10/26/19 1600   10/26/19 1000  metroNIDAZOLE (FLAGYL) IVPB 500 mg  Status:  Discontinued        500 mg 100 mL/hr over 60 Minutes Intravenous Every 8 hours 10/26/19 0909 10/29/19 1138   10/25/19 1445  azithromycin (ZITHROMAX) 500 mg in sodium chloride 0.9 % 250 mL IVPB  Status:  Discontinued        500 mg 250 mL/hr over 60 Minutes Intravenous Every 24 hours 10/25/19 1353 10/29/19 1138   10/25/19 1230  azithromycin (ZITHROMAX) tablet 500 mg  Status:  Discontinued        500 mg Oral Daily 10/25/19 1133 10/25/19 1134   10/25/19 1030  cefTRIAXone (ROCEPHIN) 1 g in sodium chloride 0.9 % 100 mL IVPB  Status:  Discontinued        1 g 200 mL/hr over 30 Minutes Intravenous Every 12 hours 10/25/19 0931 10/26/19 1351      Subjective:  Patient seen and examined at bedside this morning.  Sleeping.  Not in distress.  Objective: Vitals:   12/26/19 2035 12/27/19 0426 12/27/19 0600 12/27/19 0729  BP: 111/89 117/90  103/77  Pulse: 63 72  72  Resp: 18 18  20   Temp: (!) 97.5 F (36.4 C) 98.1 F (36.7 C)  98.2 F (36.8 C)  TempSrc:      SpO2: 100% 97%  100%  Weight:   95.3 kg   Height:       No intake or output data in the 24 hours ending 12/27/19 0753 Filed Weights   12/21/19 0740 12/22/19 0400 12/27/19 0600  Weight: 99.7 kg 99.8 kg 95.3 kg    Examination:  General exam: Sleeping, morbidly obese Respiratory system: Bilateral diminished air entry on the bases, no wheezes or crackles  Cardiovascular system: S1 & S2 heard, RRR. No JVD, murmurs, rubs, gallops or clicks. Gastrointestinal system: Abdomen is distended, soft and nontender. No organomegaly or masses felt. Normal bowel sounds heard.Ascites Central nervous system: Alert and oriented. No focal neurological deficits. Extremities: Anasarca, no clubbing ,no cyanosis Skin: Edematous skin folds, no ulcers,no icterus ,no  pallor     Data Reviewed: I have personally reviewed following labs and imaging studies  CBC: Recent Labs  Lab 12/23/19 0606 12/26/19 0524  WBC 5.1 5.1  HGB 9.5* 9.6*  HCT 31.6* 32.5*  MCV 74.0* 76.8*  PLT 376 323   Basic Metabolic Panel: Recent Labs  Lab 12/21/19 0437 12/22/19 0412 12/24/19 0449 12/25/19 0518 12/26/19 0524 12/27/19 0433  NA 137 140 140 140 138 136  K 4.2 3.8 4.1 3.7 4.3 4.4  CL 99 101 103 102 101 101  CO2 27 28 27 26 26 25   GLUCOSE 88 109* 116* 89 113* 91  BUN 60* 57* 58* 57* 59* 58*  CREATININE 2.76* 2.87* 3.35* 3.38* 3.52* 3.39*  CALCIUM 9.2 9.6 9.2 9.1 8.9 9.2  MG 1.9 2.0  --   --   --   --    GFR: Estimated Creatinine Clearance: 19.5 mL/min (A) (by C-G formula based on SCr of 3.39 mg/dL (H)). Liver Function Tests: No results for input(s): AST, ALT, ALKPHOS, BILITOT, PROT, ALBUMIN in the last 168 hours. No results for input(s): LIPASE, AMYLASE in the last 168 hours. No results for input(s): AMMONIA in the last 168 hours. Coagulation Profile: No results for input(s): INR, PROTIME in the last 168 hours. Cardiac Enzymes: No results for input(s): CKTOTAL, CKMB, CKMBINDEX, TROPONINI in the last 168 hours. BNP (last 3 results) No results for input(s): PROBNP in the last 8760 hours. HbA1C: No results for input(s): HGBA1C in the last 72 hours. CBG: No results for input(s): GLUCAP in the last 168 hours. Lipid Profile: No results for input(s): CHOL, HDL, LDLCALC, TRIG, CHOLHDL, LDLDIRECT in the last 72 hours. Thyroid Function Tests: No results for input(s): TSH, T4TOTAL, FREET4, T3FREE, THYROIDAB in the last 72 hours. Anemia Panel: No results for input(s): VITAMINB12, FOLATE, FERRITIN, TIBC, IRON, RETICCTPCT in the last 72 hours. Sepsis Labs: No results for input(s): PROCALCITON, LATICACIDVEN in the last 168 hours.  No results found for this or any previous visit (from the past 240 hour(s)).       Radiology Studies: No results  found.      Scheduled Meds: . apixaban  5 mg Oral BID  . bumetanide  2 mg Oral BID  . ciprofloxacin  500 mg Oral Q breakfast  . epoetin (EPOGEN/PROCRIT) injection  20,000 Units Subcutaneous Weekly  . iron polysaccharides  150 mg Oral Daily  . ketotifen  1 drop Both Eyes BID  . LORazepam  1 mg Intramuscular Once  . mouth rinse  15 mL  Mouth Rinse BID  . melatonin  2.5 mg Oral QHS  . midodrine  10 mg Oral TID WC  . multivitamin  1 tablet Oral QHS  . pantoprazole  40 mg Oral BID  . polyethylene glycol  17 g Oral Daily  . spironolactone  25 mg Oral Daily   Continuous Infusions:   LOS: 69 days    Time spent: 25 mins.More than 50% of that time was spent in counseling and/or coordination of care.      Shelly Coss, MD [facility] P12/19/2021, 7:53 AM

## 2019-12-28 LAB — BASIC METABOLIC PANEL
Anion gap: 10 (ref 5–15)
BUN: 56 mg/dL — ABNORMAL HIGH (ref 8–23)
CO2: 24 mmol/L (ref 22–32)
Calcium: 9.1 mg/dL (ref 8.9–10.3)
Chloride: 104 mmol/L (ref 98–111)
Creatinine, Ser: 3.2 mg/dL — ABNORMAL HIGH (ref 0.44–1.00)
GFR, Estimated: 16 mL/min — ABNORMAL LOW (ref >60)
Glucose, Bld: 85 mg/dL (ref 70–99)
Potassium: 4.3 mmol/L (ref 3.5–5.1)
Sodium: 138 mmol/L (ref 135–145)

## 2019-12-28 MED ORDER — HYDROMORPHONE HCL 1 MG/ML IJ SOLN: 1.0000 mg | INTRAMUSCULAR | Status: DC | PRN

## 2019-12-28 MED ORDER — HYDROMORPHONE HCL 1 MG/ML IJ SOLN
0.5000 mg | INTRAMUSCULAR | Status: AC | PRN
  Administered 2019-12-28 – 2019-12-29 (×2): 0.5 mg via INTRAVENOUS
  Filled 2019-12-28 (×2): qty 1

## 2019-12-28 NOTE — Progress Notes (Signed)
PROGRESS NOTE    Katelyn Lane  QMG:[id number] DOB: 1955/03/03 DOA: 10/19/2019 PCP: [name], MD   Brief Narrative: Katelyn Lane is a 64 y.o. 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 After admission to the hospital, she was diagnosed with acute on chronic combinedsystolic and diastolic congestive heart failurewith ejection fraction 25 to 30%. She was not responding to IV Lasix. She was seen by nephrology, temporary dialysis catheter was performed and she was dialyzed on 10/17 and 10/18.Patient also complaining of abdominal pain, she had a paracentesis, study showed possible spontaneous peritonitis, she was treated with Rocephin and Flagyl. Patient had a worsening respiratory status on 10/17, was diagnosed with aspiration pneumonia. She was treated with Rocephin, Zithromax and Flagyl.Patient also had abdominal pain and a CT scan suspect ischemia, but seen by general surgery on 10/19, no need for surgery. Patient was evaluated by psychiatry and she does not have decision-making capacity. Family does not want to participate in making decisions for her. DSS case was filed for guardianship and still pending. Difficult disposition due to unsafe discharge. Nephrology team is also following.  She was started on IV Lasix and spironolactone for volume overload.  Currently on Bumex and spironolactone  Assessment & Plan:   Principal Problem:   Spontaneous bacterial peritonitis ([place]) Active Problems:   AKI (acute kidney injury) ([place])   Crack cocaine use   Acute respiratory failure with hypoxia (HCC)   LV (left ventricular) mural thrombus   Chronic kidney disease (CKD) stage  G3b/A1, moderately decreased glomerular filtration rate (GFR) between 30-44 mL/min/1.73 square meter and albuminuria creatinine ratio less than 30 mg/g (HCC)   Nonsustained ventricular tachycardia (HCC)   Acute CHF (congestive heart failure) (HCC)   Cocaine abuse ([place])   Peripheral edema   Cirrhosis of liver (HCC)   Dementia (HCC)   Aspiration pneumonia of both lower lobes due to gastric secretions (HCC)   Acute diastolic (congestive) heart failure ([name])   Cardiorenal disease   Goals of care, counseling/discussion   Palliative care by specialist   Ascites due to alcoholic cirrhosis ([place])   Liver cirrhosis with ascites complicated by SBP: s/p paracentesis x2 (11/02/2019 and 12/15/2019).  Paracentesis could not be performed on 12/17/2019 because there was not enough fluid on abdominal ultrasound for safe paracentesis.     Completed antibiotics for SBP. Continue ciprofloxacin for SBP prophylaxis. Patient says she has abdominal discomfort and that has made her uncomfortable.  She underwent ultrasound-guided paracentesis on 12/25/19 but radiology department didnt document how much fluid was taken out.  Acute on chronic combined systolic and diastolic CHF: echo on 04/02/69 shows EF of 25-30%, grade III diastolic dysfunction. . Received HD x 2 but does not want HD again.    She looked extremely volume overloaded and had severe  peripheral edema.  IV lasix now D/ced . Continue bumex  and spironolactone.  Nephrology also following   Acute hypoxemic respiratory failure: On room air today.  Lungs are clear on auscultation.  Hypoxia could be from congestion from volume overload.  Hypotension: Borderline low BP.  Continue midodrine  AKI on CKDIIIb: Creatinine in the range of 3.  Nephrology following.  Continue monitoring.  Continue diuresis  Left ventricular thrombus:w/ hx of CVA.  Continue Eliquis  Overgrown toenails: Follow-up with podiatrist as an outpatient.  Anemia of chronic  disease:continue on iron supplements.H&H are stable  Hypokalemia: Being monitored and supplemented  Depression/insomnia: Trazodone was discontinued on 12/17/2019 per patient's request  Vaginal Itching, hyponatremia, thrombocytosis:resolved.  Decision Making Capacity: psych does not believe the pt has the pt has the capacity to make rational decision in regards to formulating a discharge plan. No evidence of imminent risk to self or others currently as per psych  Morbid obesity: BMI of 35.5  Other comorbidities include GERD, constipation and depression, insomnia  Nutrition Problem: Increased nutrient needs Etiology: chronic illness (cirrhosis, CHF)      DVT prophylaxis:Eliquis Code Status: DNR Family Communication: None at the bedside Status is: Inpatient  Remains inpatient appropriate because:Unsafe d/c plan   Dispo: The patient is from: Home              Anticipated d/c is to: SNF              Anticipated d/c date is: > 3 days          Patient currently is medically stable for discharge.   consultants: Nephrology  Procedures:None  Antimicrobials:  Anti-infectives (From admission, onward)   Start     Dose/Rate Route Frequency Ordered Stop   11/14/19 0800  ciprofloxacin (CIPRO) tablet 500 mg        500 mg Oral Daily with breakfast 11/13/19 1938     11/01/19 1000  cefTRIAXone (ROCEPHIN) 2 g in sodium chloride 0.9 % 100 mL IVPB  Status:  Discontinued        2 g 200 mL/hr over 30 Minutes Intravenous Daily 10/31/19 1521 11/09/19 1425   10/27/19 1000  cefTRIAXone (ROCEPHIN) 2 g in sodium chloride 0.9 % 100 mL IVPB  Status:  Discontinued        2 g 200 mL/hr over 30 Minutes Intravenous Daily 10/26/19 1559 10/31/19 1521   10/26/19 2000  cefTRIAXone (ROCEPHIN) 1 g in sodium chloride 0.9 % 100 mL IVPB        1 g 200 mL/hr over 30 Minutes Intravenous  Once 10/26/19 1559 10/26/19 2024   10/26/19 1600  cefTRIAXone (ROCEPHIN) 2 g in sodium chloride 0.9 % 100 mL IVPB   Status:  Discontinued        2 g 200 mL/hr over 30 Minutes Intravenous Every 24 hours 10/26/19 1351 10/26/19 1600   10/26/19 1000  metroNIDAZOLE (FLAGYL) IVPB 500 mg  Status:  Discontinued        500 mg 100 mL/hr over 60 Minutes Intravenous Every 8 hours 10/26/19 0909 10/29/19 1138   10/25/19 1445  azithromycin (ZITHROMAX) 500 mg in sodium chloride 0.9 % 250 mL IVPB  Status:  Discontinued        500 mg 250 mL/hr over 60 Minutes Intravenous Every 24 hours 10/25/19 1353 10/29/19 1138   10/25/19 1230  azithromycin (ZITHROMAX) tablet 500 mg  Status:  Discontinued        500 mg Oral Daily 10/25/19 1133 10/25/19 1134   10/25/19 1030  cefTRIAXone (ROCEPHIN) 1 g in sodium chloride 0.9 % 100 mL IVPB  Status:  Discontinued        1 g 200 mL/hr over 30 Minutes Intravenous Every 12 hours 10/25/19 0931 10/26/19 1351      Subjective:  Patient seen and examined at bedside this morning.  Hemodynamically stable.  On the potty when I entered the room.  Denies any complaints today.  States she feels great today  Objective: Vitals:   12/27/19 2028 12/28/19 0019 12/28/19 0432 12/28/19 0738  BP: (!) 119/93 (!) 123/99 116/79 (!) 120/95  Pulse: 73 78 83 81  Resp: 18   18  Temp: (!) 97.5 F (36.4 C) 97.7 F (36.5 C) 98.5 F (36.9 C) 97.8 F (36.6 C)  TempSrc: Oral   Oral  SpO2: 100% 100% 99% 100%  Weight:      Height:       No intake or output data in the 24 hours ending 12/28/19 0810 Filed Weights   12/21/19 0740 12/22/19 0400 12/27/19 0600  Weight: 99.7 kg 99.8 kg 95.3 kg    Examination:  General exam: Appears calm and comfortable ,Not in distress,obese Respiratory system:  no wheezes or crackles  Cardiovascular system: S1 & S2 heard, RRR. No JVD, murmurs, rubs, gallops or clicks. Gastrointestinal system: Abdomen is distended, soft and nontender. No organomegaly or masses felt. Normal bowel sounds heard. Central nervous system: Alert and oriented. No focal neurological  deficits. Extremities: Anasarca , no clubbing ,no cyanosis Skin: No rashes, lesions or ulcers,no icterus ,no pallor,edematous skin   Data Reviewed: I have personally reviewed following labs and imaging studies  CBC: Recent Labs  Lab 12/23/19 0606 12/26/19 0524  WBC 5.1 5.1  HGB 9.5* 9.6*  HCT 31.6* 32.5*  MCV 74.0* 76.8*  PLT 376 716   Basic Metabolic Panel: Recent Labs  Lab 12/22/19 0412 12/24/19 0449 12/25/19 0518 12/26/19 0524 12/27/19 0433 12/28/19 0618  NA 140 140 140 138 136 138  K 3.8 4.1 3.7 4.3 4.4 4.3  CL 101 103 102 101 101 104  CO2 28 27 26 26 25 24   GLUCOSE 109* 116* 89 113* 91 85  BUN 57* 58* 57* 59* 58* 56*  CREATININE 2.87* 3.35* 3.38* 3.52* 3.39* 3.20*  CALCIUM 9.6 9.2 9.1 8.9 9.2 9.1  MG 2.0  --   --   --   --   --    GFR: Estimated Creatinine Clearance: 20.7 mL/min (A) (by C-G formula based on SCr of 3.2 mg/dL (H)). Liver Function Tests: No results for input(s): AST, ALT, ALKPHOS, BILITOT, PROT, ALBUMIN in the last 168 hours. No results for input(s): LIPASE, AMYLASE in the last 168 hours. No results for input(s): AMMONIA in the last 168 hours. Coagulation Profile: No results for input(s): INR, PROTIME in the last 168 hours. Cardiac Enzymes: No results for input(s): CKTOTAL, CKMB, CKMBINDEX, TROPONINI in the last 168 hours. BNP (last 3 results) No results for input(s): PROBNP in the last 8760 hours. HbA1C: No results for input(s): HGBA1C in the last 72 hours. CBG: No results for input(s): GLUCAP in the last 168 hours. Lipid Profile: No results for input(s): CHOL, HDL, LDLCALC, TRIG, CHOLHDL, LDLDIRECT in the last 72 hours. Thyroid Function Tests: No results for input(s): TSH, T4TOTAL, FREET4, T3FREE, THYROIDAB in the last 72 hours. Anemia Panel: No results for input(s): VITAMINB12, FOLATE, FERRITIN, TIBC, IRON, RETICCTPCT in the last 72 hours. Sepsis Labs: No results for input(s): PROCALCITON, LATICACIDVEN in the last 168 hours.  No  results found for this or any previous visit (from the past 240 hour(s)).       Radiology Studies: No results found.      Scheduled Meds: . apixaban  5 mg Oral BID  . bumetanide  2 mg Oral BID  . ciprofloxacin  500 mg Oral Q breakfast  . epoetin (EPOGEN/PROCRIT) injection  20,000 Units Subcutaneous Weekly  . iron polysaccharides  150 mg Oral Daily  .  ketotifen  1 drop Both Eyes BID  . LORazepam  1 mg Intramuscular Once  . mouth rinse  15 mL Mouth Rinse BID  . melatonin  2.5 mg Oral QHS  . midodrine  10 mg Oral TID WC  . multivitamin  1 tablet Oral QHS  . pantoprazole  40 mg Oral BID  . polyethylene glycol  17 g Oral Daily  . spironolactone  25 mg Oral Daily   Continuous Infusions:   LOS: 70 days    Time spent: 25 mins.More than 50% of that time was spent in counseling and/or coordination of care.      Shelly Coss, MD [facility] P12/20/2021, 8:10 AM

## 2019-12-29 LAB — CBC
HCT: 32.9 % — ABNORMAL LOW (ref 36.0–46.0)
Hemoglobin: 10.2 g/dL — ABNORMAL LOW (ref 12.0–15.0)
MCH: 22.6 pg — ABNORMAL LOW (ref 26.0–34.0)
MCHC: 31 g/dL (ref 30.0–36.0)
MCV: 72.8 fL — ABNORMAL LOW (ref 80.0–100.0)
Platelets: 318 10*3/uL (ref 150–400)
RBC: 4.52 MIL/uL (ref 3.87–5.11)
RDW: 20 % — ABNORMAL HIGH (ref 11.5–15.5)
WBC: 4.3 10*3/uL (ref 4.0–10.5)
nRBC: 0 % (ref 0.0–0.2)

## 2019-12-29 LAB — BASIC METABOLIC PANEL
Anion gap: 10 (ref 5–15)
BUN: 57 mg/dL — ABNORMAL HIGH (ref 8–23)
CO2: 25 mmol/L (ref 22–32)
Calcium: 9.1 mg/dL (ref 8.9–10.3)
Chloride: 102 mmol/L (ref 98–111)
Creatinine, Ser: 3.37 mg/dL — ABNORMAL HIGH (ref 0.44–1.00)
GFR, Estimated: 15 mL/min — ABNORMAL LOW (ref >60)
Glucose, Bld: 86 mg/dL (ref 70–99)
Potassium: 4.3 mmol/L (ref 3.5–5.1)
Sodium: 137 mmol/L (ref 135–145)

## 2019-12-29 NOTE — Progress Notes (Addendum)
Nutrition Follow-up  DOCUMENTATION CODES:   Obesity unspecified  INTERVENTION:  Patient continues to decline all nutrition interventions.  Continue Rena-vit po QHS.  NUTRITION DIAGNOSIS:   Increased nutrient needs related to chronic illness (cirrhosis, CHF) as evidenced by estimated needs.  Ongoing.  GOAL:   Patient will meet greater than or equal to 90% of their needs  Progressing.  MONITOR:   PO intake,Supplement acceptance,Labs,Weight trends,Skin,I & O's  REASON FOR ASSESSMENT:   LOS    ASSESSMENT:   64 y.o. 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  Met with patient at bedside. She reports her appetite remains good and she is eating well at meals. Patient reports she is eating 100% of meals. Patient reports her edema is improving. Discussed increased nutrient needs. Patient continues to decline oral nutrition supplements and other interventions offered by RD. She reports she just wants to eat the meals she orders. Patient having regular bowel movements.  Medications reviewed and include: Cipro, Epogen 20000 units weekly, Protonix, spironolactone.  Labs reviewed: BUN 57, Creatinine 3.37.  I/O: 2 occurrences UOP yesterday unmeasured  Weight trend: 95.3 kg on 12/19; weights fluctuate significantly during admission related to fluid status but overall stable from 95.4 kg on 10/26  Diet Order:   Diet Order            Diet Heart Room service appropriate? Yes; Fluid consistency: Thin; Fluid restriction: 1500 mL Fluid  Diet effective now                EDUCATION NEEDS:   Education needs have been addressed  Skin:  Skin Assessment: Skin Integrity Issues: Skin Integrity Issues:: Incisions Incisions: closed incision lower  abdomen  Last BM:  12/29/2019 - medium type 4  Height:   Ht Readings from Last 1 Encounters:  11/13/19 $RemoveB'5\' 6"'[id number]$  (1.676 m)   Weight:   Wt Readings from Last 1 Encounters:  12/27/19 95.3 kg   Ideal Body Weight:  59 kg  BMI:  Body mass index is 33.93 kg/m.  Estimated Nutritional Needs:   Kcal:  1900-2200kcal/day  Protein:  95-110g/day  Fluid:  1.8-2L/day  Jacklynn Barnacle, MS, RD, LDN Pager number available on Amion

## 2019-12-29 NOTE — Progress Notes (Signed)
PROGRESS NOTE    Katelyn Lane  ZOX:[id number] DOB: 1955-10-07 DOA: 10/19/2019 PCP: [name], MD   Brief Narrative: Katelyn Lane is a 64 y.o. 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 After admission to the hospital, she was diagnosed with acute on chronic combinedsystolic and diastolic congestive heart failurewith ejection fraction 25 to 30%. She was not responding to IV Lasix. She was seen by nephrology, temporary dialysis catheter was performed and she was dialyzed on 10/17 and 10/18.Patient also complaining of abdominal pain, she had a paracentesis, study showed possible spontaneous peritonitis, she was treated with Rocephin and Flagyl. Patient had a worsening respiratory status on 10/17, was diagnosed with aspiration pneumonia. She was treated with Rocephin, Zithromax and Flagyl.Patient also had abdominal pain and a CT scan suspect ischemia, but seen by general surgery on 10/19, no need for surgery. Patient was evaluated by psychiatry and she does not have decision-making capacity. Family does not want to participate in making decisions for her. DSS case was filed for guardianship and still pending. Difficult disposition due to unsafe discharge. Nephrology team is also following.  She was started on IV Lasix and spironolactone for volume overload.  Currently on Bumex and spironolactone. Disposition pending.  Assessment & Plan:   Principal Problem:   Spontaneous bacterial peritonitis ([place]) Active Problems:   AKI (acute kidney injury) ([place])   Crack cocaine use   Acute respiratory failure with hypoxia (HCC)   LV (left ventricular) mural thrombus   Chronic  kidney disease (CKD) stage G3b/A1, moderately decreased glomerular filtration rate (GFR) between 30-44 mL/min/1.73 square meter and albuminuria creatinine ratio less than 30 mg/g (HCC)   Nonsustained ventricular tachycardia (HCC)   Acute CHF (congestive heart failure) (HCC)   Cocaine abuse ([place])   Peripheral edema   Cirrhosis of liver (HCC)   Dementia (HCC)   Aspiration pneumonia of both lower lobes due to gastric secretions (HCC)   Acute diastolic (congestive) heart failure ([place])   Cardiorenal disease   Goals of care, counseling/discussion   Palliative care by specialist   Ascites due to alcoholic cirrhosis ([place])   Liver cirrhosis with ascites complicated by SBP: s/p paracentesis x2 (11/02/2019 and 12/15/2019).  Paracentesis could not be performed on 12/17/2019 because there was not enough fluid on abdominal ultrasound for safe paracentesis.     Completed antibiotics for SBP. Continue ciprofloxacin for SBP prophylaxis. Patient says she has abdominal discomfort and that has made her uncomfortable.  She underwent ultrasound-guided paracentesis on 12/25/19 but radiology department didnt document how much fluid was taken out.  Acute on chronic combined systolic and diastolic CHF: echo on 08/18/89 shows EF of 25-30%, grade III diastolic dysfunction. . Received HD x 2 but does not want HD again.    She looked extremely volume overloaded and had severe  peripheral edema.  IV lasix now D/ced . Continue bumex  and spironolactone.  Nephrology also following   Acute hypoxemic respiratory failure: On room air today.  Lungs are clear on auscultation.  Hypoxia could be from congestion from volume overload.  Hypotension: Borderline low BP.  Continue midodrine  AKI on CKDIIIb/IV: Creatinine in the range of 3.  Nephrology following.  Continue monitoring.  Continue diuresis  Left ventricular thrombus:w/ hx of CVA.  Continue  Eliquis  Overgrown toenails: Follow-up with podiatrist as an  outpatient.  Anemia of chronic disease:continue on iron supplements.H&H are stable  Hypokalemia: Being monitored and supplemented  Depression/insomnia: Trazodone was discontinued on 12/17/2019 per patient's request  Vaginal Itching, hyponatremia, thrombocytosis:resolved.  Decision Making Capacity: psych does not believe the pt has the pt has the capacity to make rational decision in regards to formulating a discharge plan. No evidence of imminent risk to self or others currently as per psych  Morbid obesity: BMI of 35.5  Other comorbidities include GERD, constipation and depression, insomnia  Nutrition Problem: Increased nutrient needs Etiology: chronic illness (cirrhosis, CHF)      DVT prophylaxis:Eliquis Code Status: DNR Family Communication: None at the bedside Status is: Inpatient  Remains inpatient appropriate because:Unsafe d/c plan   Dispo: The patient is from: Home              Anticipated d/c is to: SNF              Anticipated d/c date is: > 3 days          Patient currently is medically stable for discharge.   consultants: Nephrology  Procedures:None  Antimicrobials:  Anti-infectives (From admission, onward)   Start     Dose/Rate Route Frequency Ordered Stop   11/14/19 0800  ciprofloxacin (CIPRO) tablet 500 mg        500 mg Oral Daily with breakfast 11/13/19 1938     11/01/19 1000  cefTRIAXone (ROCEPHIN) 2 g in sodium chloride 0.9 % 100 mL IVPB  Status:  Discontinued        2 g 200 mL/hr over 30 Minutes Intravenous Daily 10/31/19 1521 11/09/19 1425   10/27/19 1000  cefTRIAXone (ROCEPHIN) 2 g in sodium chloride 0.9 % 100 mL IVPB  Status:  Discontinued        2 g 200 mL/hr over 30 Minutes Intravenous Daily 10/26/19 1559 10/31/19 1521   10/26/19 2000  cefTRIAXone (ROCEPHIN) 1 g in sodium chloride 0.9 % 100 mL IVPB        1 g 200 mL/hr over 30 Minutes Intravenous  Once 10/26/19 1559 10/26/19 2024   10/26/19 1600  cefTRIAXone (ROCEPHIN) 2 g in sodium  chloride 0.9 % 100 mL IVPB  Status:  Discontinued        2 g 200 mL/hr over 30 Minutes Intravenous Every 24 hours 10/26/19 1351 10/26/19 1600   10/26/19 1000  metroNIDAZOLE (FLAGYL) IVPB 500 mg  Status:  Discontinued        500 mg 100 mL/hr over 60 Minutes Intravenous Every 8 hours 10/26/19 0909 10/29/19 1138   10/25/19 1445  azithromycin (ZITHROMAX) 500 mg in sodium chloride 0.9 % 250 mL IVPB  Status:  Discontinued        500 mg 250 mL/hr over 60 Minutes Intravenous Every 24 hours 10/25/19 1353 10/29/19 1138   10/25/19 1230  azithromycin (ZITHROMAX) tablet 500 mg  Status:  Discontinued        500 mg Oral Daily 10/25/19 1133 10/25/19 1134   10/25/19 1030  cefTRIAXone (ROCEPHIN) 1 g in sodium chloride 0.9 % 100 mL IVPB  Status:  Discontinued        1 g 200 mL/hr over 30 Minutes Intravenous Every 12 hours 10/25/19 0931 10/26/19 1351      Subjective:  Patient seen and examined at the bedside this morning.  Hemodynamically stable during my evaluation.  Lying on the bed.  Complains of pain on the left foot but upon  the mechanics of the foot there was nothing besides long toenails.  Objective: Vitals:   12/28/19 1525 12/28/19 2341 12/29/19 0521 12/29/19 0736  BP: (!) 137/106 (!) 122/93 (!) 115/94 111/90  Pulse: 78 66 85 72  Resp: 16 17 17 17   Temp:  98.2 F (36.8 C) 98 F (36.7 C) 97.9 F (36.6 C)  TempSrc:    Oral  SpO2: 95% 100% 100% 97%  Weight:      Height:        Intake/Output Summary (Last 24 hours) at 12/29/2019 0814 Last data filed at 12/28/2019 1011 Gross per 24 hour  Intake 240 ml  Output --  Net 240 ml   Filed Weights   12/21/19 0740 12/22/19 0400 12/27/19 0600  Weight: 99.7 kg 99.8 kg 95.3 kg    Examination:  General exam: Not in distress, obese Respiratory system: Bilateral equal air entry, normal vesicular breath sounds, no wheezes or crackles  Cardiovascular system: S1 & S2 heard, RRR. No JVD, murmurs, rubs, gallops or clicks. Gastrointestinal system:  Abdomen is distended, soft and nontender. No organomegaly or masses felt. Normal bowel sounds heard. Central nervous system: Alert and oriented. No focal neurological deficits. Extremities: Anasarca, no clubbing ,no cyanosis, long toenails Skin: Edematous  skin, no ulcers,no icterus ,no pallor    Data Reviewed: I have personally reviewed following labs and imaging studies  CBC: Recent Labs  Lab 12/23/19 0606 12/26/19 0524 12/29/19 0505  WBC 5.1 5.1 4.3  HGB 9.5* 9.6* 10.2*  HCT 31.6* 32.5* 32.9*  MCV 74.0* 76.8* 72.8*  PLT 376 334 149   Basic Metabolic Panel: Recent Labs  Lab 12/25/19 0518 12/26/19 0524 12/27/19 0433 12/28/19 0618 12/29/19 0505  NA 140 138 136 138 137  K 3.7 4.3 4.4 4.3 4.3  CL 102 101 101 104 102  CO2 26 26 25 24 25   GLUCOSE 89 113* 91 85 86  BUN 57* 59* 58* 56* 57*  CREATININE 3.38* 3.52* 3.39* 3.20* 3.37*  CALCIUM 9.1 8.9 9.2 9.1 9.1   GFR: Estimated Creatinine Clearance: 19.6 mL/min (A) (by C-G formula based on SCr of 3.37 mg/dL (H)). Liver Function Tests: No results for input(s): AST, ALT, ALKPHOS, BILITOT, PROT, ALBUMIN in the last 168 hours. No results for input(s): LIPASE, AMYLASE in the last 168 hours. No results for input(s): AMMONIA in the last 168 hours. Coagulation Profile: No results for input(s): INR, PROTIME in the last 168 hours. Cardiac Enzymes: No results for input(s): CKTOTAL, CKMB, CKMBINDEX, TROPONINI in the last 168 hours. BNP (last 3 results) No results for input(s): PROBNP in the last 8760 hours. HbA1C: No results for input(s): HGBA1C in the last 72 hours. CBG: No results for input(s): GLUCAP in the last 168 hours. Lipid Profile: No results for input(s): CHOL, HDL, LDLCALC, TRIG, CHOLHDL, LDLDIRECT in the last 72 hours. Thyroid Function Tests: No results for input(s): TSH, T4TOTAL, FREET4, T3FREE, THYROIDAB in the last 72 hours. Anemia Panel: No results for input(s): VITAMINB12, FOLATE, FERRITIN, TIBC, IRON,  RETICCTPCT in the last 72 hours. Sepsis Labs: No results for input(s): PROCALCITON, LATICACIDVEN in the last 168 hours.  No results found for this or any previous visit (from the past 240 hour(s)).       Radiology Studies: No results found.      Scheduled Meds: . apixaban  5 mg Oral BID  . bumetanide  2 mg Oral BID  . ciprofloxacin  500 mg Oral Q breakfast  . epoetin (EPOGEN/PROCRIT) injection  20,000 Units Subcutaneous  Weekly  . iron polysaccharides  150 mg Oral Daily  . ketotifen  1 drop Both Eyes BID  . LORazepam  1 mg Intramuscular Once  . mouth rinse  15 mL Mouth Rinse BID  . melatonin  2.5 mg Oral QHS  . midodrine  10 mg Oral TID WC  . multivitamin  1 tablet Oral QHS  . pantoprazole  40 mg Oral BID  . polyethylene glycol  17 g Oral Daily  . spironolactone  25 mg Oral Daily   Continuous Infusions:   LOS: 71 days    Time spent: 25 mins.More than 50% of that time was spent in counseling and/or coordination of care.      Shelly Coss, MD [facility] P12/21/2021, 8:14 AM

## 2019-12-30 LAB — BASIC METABOLIC PANEL
Anion gap: 11 (ref 5–15)
BUN: 58 mg/dL — ABNORMAL HIGH (ref 8–23)
CO2: 25 mmol/L (ref 22–32)
Calcium: 9.7 mg/dL (ref 8.9–10.3)
Chloride: 102 mmol/L (ref 98–111)
Creatinine, Ser: 3.48 mg/dL — ABNORMAL HIGH (ref 0.44–1.00)
GFR, Estimated: 14 mL/min — ABNORMAL LOW (ref >60)
Glucose, Bld: 92 mg/dL (ref 70–99)
Potassium: 4.1 mmol/L (ref 3.5–5.1)
Sodium: 138 mmol/L (ref 135–145)

## 2019-12-30 NOTE — Plan of Care (Signed)
Pt sitting on recliner by bedside,Nephew visiting and chatting with Pt.NAD.Pt calm, denies any pain or discomfort.Pt took all scheduled medications,appetite and fluid intake good.Pt progressing with care plans and aware of HF/CKD and complying with MD orders

## 2019-12-30 NOTE — Progress Notes (Signed)
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 After admission to the hospital, she was diagnosed with acute on chronic combinedsystolic and diastolic congestive heart failurewith ejection fraction 25 to 30%. She was not responding to IV Lasix. She was seen by nephrology, temporary dialysis catheter was performed and she was dialyzed on 10/17 and 10/18.Patient also complaining of abdominal pain, she had a paracentesis, study showed possible spontaneous peritonitis, she was treated with Rocephin and Flagyl. Patient had a worsening respiratory status on 10/17, was diagnosed with aspiration pneumonia. She was treated with Rocephin, Zithromax and Flagyl.Patient also had abdominal pain and a CT scan suspect ischemia, but seen by general surgery on 10/19, no need for surgery. Patient was evaluated by psychiatry and she does not havedecision-making capacity. Family does not want to participate in making decisions for her. DSS case was filed for guardianship and still pending. Difficult disposition due to unsafe discharge. Nephrology team is also following.  She was started on IV Lasix and spironolactone for volume overload.  Currently on Bumex and spironolactone. Disposition pending.   Assessment & Plan:   Principal Problem:   Spontaneous bacterial peritonitis ([place]) Active Problems:   AKI (acute kidney injury) ([place])   Crack cocaine use   Acute respiratory failure with hypoxia (HCC)   LV (left ventricular) mural thrombus   Chronic  kidney disease (CKD) stage G3b/A1, moderately decreased glomerular filtration rate (GFR) between 30-44 mL/min/1.73 square meter and albuminuria creatinine ratio less than 30 mg/g (HCC)   Nonsustained ventricular tachycardia (HCC)   Acute CHF (congestive heart failure) (HCC)   Cocaine abuse ([place])   Peripheral edema   Cirrhosis of liver (HCC)   Dementia (HCC)   Aspiration pneumonia of both lower lobes due to gastric secretions (HCC)   Acute diastolic (congestive) heart failure ([place])   Cardiorenal disease   Goals of care, counseling/discussion   Palliative care by specialist   Ascites due to alcoholic cirrhosis ([place])  #1.  Liver cirrhosis with ascites and spontaneous bacterial peritonitis. Currently on prophylactic Cipro.  Last paracentesis was 12/17. Continue diuretics.  2.  Pure acute on chronic combined systolic and diastolic congestive heart failure. Condition stable. Still on oral diuretics.  3.  Acute hypoxemic respite failure.  Condition improved.  4.  Acute kidney injury on chronic kidney disease stage IIIb. Discussed with Dr. Candiss Norse, patient does not want hemodialysis.  #5.  Left ventricular thrombus. Continue Eliquis.    DVT prophylaxis: Eliquis Code Status: DNR Family Communication: Niece at bedside Disposition Plan:     Status is: Inpatient  Remains inpatient appropriate because:Unsafe d/c plan   Dispo: The patient is from: Home              Anticipated d/c is to: SNF              Anticipated d/c date is: > 3 days              Patient currently is medically stable to d/c.        No intake/output data recorded. No intake/output data recorded.     Consultants:   Nephrology  Procedures: None  Antimicrobials:None  Subjective: Patient still complaining of bilateral lower extremity edema. No short of breath.  No cough. No abdominal pain or nausea vomiting. No fever or chills.  Objective: Vitals:   12/30/19 0026 12/30/19 0543 12/30/19 0828  12/30/19 1144  BP: 121/85 96/83 103/81 (!) 128/112  Pulse: 67 82 74 68  Resp: 18 17 20 18   Temp: (!) 97.5 F (36.4 C) 97.6 F (36.4 C)  97.7 F (36.5 C)  TempSrc:    Oral  SpO2: 97% 95% 99% 96%  Weight:      Height:       No intake or output data in the 24 hours ending 12/30/19 1305 Filed Weights   12/21/19 0740 12/22/19 0400 12/27/19 0600  Weight: 99.7 kg 99.8 kg 95.3 kg    Examination:  General exam: Appears calm and comfortable  Respiratory system: Clear to auscultation. Respiratory effort normal. Cardiovascular system: S1 & S2 heard, RRR. No JVD, murmurs, rubs, gallops or clicks.  Gastrointestinal system: Abdomen is nondistended, soft and nontender. No organomegaly or masses felt. Normal bowel sounds heard. Central nervous system: Alert and oriented. No focal neurological deficits. Extremities: 2+ bilateral lower extremity edema. Skin: No rashes, lesions or ulcers Psychiatry: Judgement and insight appear normal. Mood & affect appropriate.     Data Reviewed: I have personally reviewed following labs and imaging studies  CBC: Recent Labs  Lab 12/26/19 0524 12/29/19 0505  WBC 5.1 4.3  HGB 9.6* 10.2*  HCT 32.5* 32.9*  MCV 76.8* 72.8*  PLT 334 784   Basic Metabolic Panel: Recent Labs  Lab 12/26/19 0524 12/27/19 0433 12/28/19 0618 12/29/19 0505 12/30/19 0542  NA 138 136 138 137 138  K 4.3 4.4 4.3 4.3 4.1  CL 101 101 104 102 102  CO2 26 25 24 25 25   GLUCOSE 113* 91 85 86 92  BUN 59* 58* 56* 57* 58*  CREATININE 3.52* 3.39* 3.20* 3.37* 3.48*  CALCIUM 8.9 9.2 9.1 9.1 9.7   GFR: Estimated Creatinine Clearance: 19 mL/min (A) (by C-G formula based on SCr of 3.48 mg/dL (H)). Liver Function Tests: No results for input(s): AST, ALT, ALKPHOS, BILITOT, PROT, ALBUMIN in the last 168 hours. No results for input(s): LIPASE, AMYLASE in the last 168 hours. No results for input(s): AMMONIA in the last 168 hours. Coagulation Profile: No results for input(s): INR,  PROTIME in the last 168 hours. Cardiac Enzymes: No results for input(s): CKTOTAL, CKMB, CKMBINDEX, TROPONINI in the last 168 hours. BNP (last 3 results) No results for input(s): PROBNP in the last 8760 hours. HbA1C: No results for input(s): HGBA1C in the last 72 hours. CBG: No results for input(s): GLUCAP in the last 168 hours. Lipid Profile: No results for input(s): CHOL, HDL, LDLCALC, TRIG, CHOLHDL, LDLDIRECT in the last 72 hours. Thyroid Function Tests: No results for input(s): TSH, T4TOTAL, FREET4, T3FREE, THYROIDAB in the last 72 hours. Anemia Panel: No results for input(s): VITAMINB12, FOLATE, FERRITIN, TIBC, IRON, RETICCTPCT in the last 72 hours. Sepsis Labs: No results for input(s): PROCALCITON, LATICACIDVEN in the last 168 hours.  No results found for this or any previous visit (from the past 240 hour(s)).       Radiology Studies: No results found.      Scheduled Meds:  apixaban  5 mg Oral BID   bumetanide  2 mg Oral BID   ciprofloxacin  500 mg Oral Q breakfast   epoetin (EPOGEN/PROCRIT) injection  20,000 Units Subcutaneous Weekly   iron polysaccharides  150  mg Oral Daily   ketotifen  1 drop Both Eyes BID   LORazepam  1 mg Intramuscular Once   mouth rinse  15 mL Mouth Rinse BID   melatonin  2.5 mg Oral QHS   midodrine  10 mg Oral TID WC   multivitamin  1 tablet Oral QHS   pantoprazole  40 mg Oral BID   polyethylene glycol  17 g Oral Daily   spironolactone  25 mg Oral Daily   Continuous Infusions:   LOS: 72 days    Time spent: 25 minutes    Sharen Hones, MD [facility]   To contact the attending provider between 7A-7P or the covering provider during after hours 7P-7A, please log into the web site [email or web address] and access using universal [place] password for that web site. If you do not have the password, please call the hospital operator.  12/30/2019, 1:05 PM

## 2019-12-30 NOTE — Progress Notes (Addendum)
   12/30/19 1800  Clinical Encounter Type  Visited With Patient  Visit Type Follow-up  Referral From Chaplain  Consult/Referral To Chaplain  Chaplain stopped by to check on Pt. Pt showed chaplain the slippers someone brought her. Chaplain took Pt some reading glasses and two devotionals. Pt said the doctor told her she needs dialysis. Pt said she doesn't want to take dialysis, but chaplain told her to think about it. She said, but it will be for the rest of my life. And she asked will I be able to drive my car. Chaplain told her she would be able to drive her car. Chaplain also explained that dialysis will enhance her life, not hinder it. Chaplain asked her again to think about it and pray about it and Pt said should would.

## 2019-12-30 NOTE — TOC Progression Note (Signed)
Transition of Care [facility]) - Progression Note    Patient Details  Name: Macall Mccroskey MRN: [id number] Date of Birth: [birth year]  Transition of Care [facility]) CM/SW [place], RN Phone Number: 12/30/2019, 3:02 PM  Clinical Narrative:   RNCM met with patient at bedside to discuss discharge planning. Per [facility] leadership patient will planned for discharge. Attempted to discuss discharge planning with patient who had to be re-directed several times. Patient reports that she will leave on her own and she will find someplace to go. Discussed possible options outside of [name] but patient reports that she will not leave [place] and it is her plan to reach out to DSS to see what kind of assistance they can offer her. Patient is agreeable to rolling walker and this was ordered through [place] with [employer].          Expected Discharge Plan and Services                                                 Social Determinants of Health (SDOH) Interventions    Readmission Risk Interventions Readmission Risk Prevention Plan 09/02/2019 08/14/2019 07/27/2019  Transportation Screening Complete Complete Complete  Medication Review [employer]) Complete Referral to Pharmacy -  PCP or Specialist appointment within 3-5 days of discharge Complete Complete Complete  HRI or Home Care Consult - Complete Complete  SW Recovery Care/Counseling Consult - Complete Complete  Palliative Care Screening Not Applicable Not Applicable Not Applicable  Skilled Nursing Facility Complete Not Applicable Not Applicable  Some recent data might be hidden

## 2019-12-31 MED ORDER — LORAZEPAM 2 MG/ML IJ SOLN
1.0000 mg | Freq: Once | INTRAMUSCULAR | Status: AC
  Administered 2019-12-31: 23:00:00 1 mg via INTRAVENOUS
  Filled 2019-12-31: qty 1

## 2019-12-31 NOTE — Plan of Care (Signed)
Problem: Education: Goal: Knowledge of General Education information will improve Description: Including pain rating scale, medication(s)/side effects and non-pharmacologic comfort measures 12/31/2019 1150 by Cristela Blue, RN Outcome: Progressing 12/31/2019 1150 by Cristela Blue, RN Outcome: Progressing   Problem: Health Behavior/Discharge Planning: Goal: Ability to manage health-related needs will improve 12/31/2019 1150 by Cristela Blue, RN Outcome: Progressing 12/31/2019 1150 by Cristela Blue, RN Outcome: Progressing   Problem: Clinical Measurements: Goal: Ability to maintain clinical measurements within normal limits will improve 12/31/2019 1150 by Cristela Blue, RN Outcome: Progressing 12/31/2019 1150 by Cristela Blue, RN Outcome: Progressing Goal: Will remain free from infection 12/31/2019 1150 by Cristela Blue, RN Outcome: Progressing 12/31/2019 1150 by Cristela Blue, RN Outcome: Progressing Goal: Diagnostic test results will improve 12/31/2019 1150 by Cristela Blue, RN Outcome: Progressing 12/31/2019 1150 by Cristela Blue, RN Outcome: Progressing Goal: Respiratory complications will improve 12/31/2019 1150 by Cristela Blue, RN Outcome: Progressing 12/31/2019 1150 by Cristela Blue, RN Outcome: Progressing Goal: Cardiovascular complication will be avoided 12/31/2019 1150 by Cristela Blue, RN Outcome: Progressing 12/31/2019 1150 by Cristela Blue, RN Outcome: Progressing   Problem: Activity: Goal: Risk for activity intolerance will decrease 12/31/2019 1150 by Cristela Blue, RN Outcome: Progressing 12/31/2019 1150 by Cristela Blue, RN Outcome: Progressing   Problem: Nutrition: Goal: Adequate nutrition will be maintained 12/31/2019 1150 by Cristela Blue, RN Outcome: Progressing 12/31/2019 1150 by Cristela Blue, RN Outcome: Progressing   Problem: Coping: Goal: Level of anxiety will decrease 12/31/2019 1150 by Cristela Blue, RN Outcome:  Progressing 12/31/2019 1150 by Cristela Blue, RN Outcome: Progressing   Problem: Elimination: Goal: Will not experience complications related to bowel motility 12/31/2019 1150 by Cristela Blue, RN Outcome: Progressing 12/31/2019 1150 by Cristela Blue, RN Outcome: Progressing Goal: Will not experience complications related to urinary retention 12/31/2019 1150 by Cristela Blue, RN Outcome: Progressing 12/31/2019 1150 by Cristela Blue, RN Outcome: Progressing   Problem: Pain Managment: Goal: General experience of comfort will improve 12/31/2019 1150 by Cristela Blue, RN Outcome: Progressing 12/31/2019 1150 by Cristela Blue, RN Outcome: Progressing   Problem: Safety: Goal: Ability to remain free from injury will improve 12/31/2019 1150 by Cristela Blue, RN Outcome: Progressing 12/31/2019 1150 by Cristela Blue, RN Outcome: Progressing   Problem: Skin Integrity: Goal: Risk for impaired skin integrity will decrease 12/31/2019 1150 by Cristela Blue, RN Outcome: Progressing 12/31/2019 1150 by Cristela Blue, RN Outcome: Progressing   Problem: Education: Goal: Ability to demonstrate management of disease process will improve 12/31/2019 1150 by Cristela Blue, RN Outcome: Progressing 12/31/2019 1150 by Cristela Blue, RN Outcome: Progressing Goal: Ability to verbalize understanding of medication therapies will improve 12/31/2019 1150 by Cristela Blue, RN Outcome: Progressing 12/31/2019 1150 by Cristela Blue, RN Outcome: Progressing Goal: Individualized Educational Video(s) 12/31/2019 1150 by Cristela Blue, RN Outcome: Progressing 12/31/2019 1150 by Cristela Blue, RN Outcome: Progressing   Problem: Activity: Goal: Capacity to carry out activities will improve 12/31/2019 1150 by Cristela Blue, RN Outcome: Progressing 12/31/2019 1150 by Cristela Blue, RN Outcome: Progressing   Problem: Cardiac: Goal: Ability to achieve and maintain adequate  cardiopulmonary perfusion will improve 12/31/2019 1150 by Cristela Blue, RN Outcome: Progressing 12/31/2019 1150 by Cristela Blue, RN Outcome: Progressing   Problem: Education: Goal: Knowledge of disease and its progression will improve 12/31/2019 1150 by Cristela Blue, RN Outcome: Progressing 12/31/2019 1150 by Cristela Blue, RN Outcome: Progressing Goal: Individualized Educational Video(s) 12/31/2019 1150 by Cristela Blue, RN Outcome: Progressing 12/31/2019 1150 by Cristela Blue, RN Outcome: Progressing   Problem: Fluid Volume: Goal: Compliance with measures  to maintain balanced fluid volume will improve 12/31/2019 1150 by Cristela Blue, RN Outcome: Progressing 12/31/2019 1150 by Cristela Blue, RN Outcome: Progressing   Problem: Health Behavior/Discharge Planning: Goal: Ability to manage health-related needs will improve 12/31/2019 1150 by Cristela Blue, RN Outcome: Progressing 12/31/2019 1150 by Cristela Blue, RN Outcome: Progressing   Problem: Nutritional: Goal: Ability to make healthy dietary choices will improve 12/31/2019 1150 by Cristela Blue, RN Outcome: Progressing 12/31/2019 1150 by Cristela Blue, RN Outcome: Progressing   Problem: Clinical Measurements: Goal: Complications related to the disease process, condition or treatment will be avoided or minimized 12/31/2019 1150 by Cristela Blue, RN Outcome: Progressing 12/31/2019 1150 by Cristela Blue, RN Outcome: Progressing

## 2019-12-31 NOTE — Progress Notes (Signed)
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 After admission to the hospital, she was diagnosed with acute on chronic combinedsystolic and diastolic congestive heart failurewith ejection fraction 25 to 30%. She was not responding to IV Lasix. She was seen by nephrology, temporary dialysis catheter was performed and she was dialyzed on 10/17 and 10/18.Patient also complaining of abdominal pain, she had a paracentesis, study showed possible spontaneous peritonitis, she was treated with Rocephin and Flagyl. Patient had a worsening respiratory status on 10/17, was diagnosed with aspiration pneumonia. She was treated with Rocephin, Zithromax and Flagyl.Patient also had abdominal pain and a CT scan suspect ischemia, but seen by general surgery on 10/19, no need for surgery. Patient was evaluated by psychiatry and she does not havedecision-making capacity. Family does not want to participate in making decisions for her. DSS case was filed for guardianship and still pending. Difficult disposition due to unsafe discharge. Nephrology team is also following. She was started on IV Lasix and spironolactone for volume overload. Currently on Bumex and spironolactone. Disposition pending.   Assessment & Plan:   Principal Problem:   Spontaneous bacterial peritonitis ([place]) Active Problems:   AKI (acute kidney injury) ([place])   Crack cocaine use   Acute respiratory failure with hypoxia (HCC)   LV (left ventricular) mural thrombus   Chronic  kidney disease (CKD) stage G3b/A1, moderately decreased glomerular filtration rate (GFR) between 30-44 mL/min/1.73 square meter and albuminuria creatinine ratio less than 30 mg/g (HCC)   Nonsustained ventricular tachycardia (HCC)   Acute CHF (congestive heart failure) (HCC)   Cocaine abuse ([place])   Peripheral edema   Cirrhosis of liver (HCC)   Dementia (HCC)   Aspiration pneumonia of both lower lobes due to gastric secretions (HCC)   Acute diastolic (congestive) heart failure ([place])   Cardiorenal disease   Goals of care, counseling/discussion   Palliative care by specialist   Ascites due to alcoholic cirrhosis ([place])  #1 pure liver cirrhosis with ascites and a spontaneous bacterial peritonitis. Condition stable.  Last paracentesis 12/17.  Still on diuretics.  Prophylactic Cipro.  2.  Acute on chronic combined diastolic and systolic congestive heart failure. Continue oral diuretics.  3.  Acute hypoxemic respiratory failure. Improved  4.  Acute kidney injury on chronic kidney disease stage IIIb. No dialysis needs at this time.  5.  Left ventricular thrombus. On Eliquis.    DVT prophylaxis: Eliquis Code Status: Full Family Communication: None Disposition Plan:  .   Status is: Inpatient  Remains inpatient appropriate because:Unsafe d/c plan Based on psychiatry note on 11/30, patient still lacks decision-making capacity.  Currently, safe environment cannot be arranged.  Patient will need to stay in the hospital for guardianship.  Dispo: The patient is from: Home              Anticipated d/c is to: SNF              Anticipated d/c date is: > 3 days              Patient currently is medically stable to d/c.  I/O last 3 completed shifts: In: 120 [P.O.:120] Out: -  Total I/O In: 240 [P.O.:240] Out: -      Consultants:   Psychiatry  Procedures: None  Antimicrobials:None  Subjective: Patient condition still stable today.  She still complaining of  bilateral leg edema, no short of breath or cough. No fever or chills. No abdominal pain or nausea vomiting.  Objective: Vitals:   12/30/19 2303 12/31/19 0459 12/31/19 0856 12/31/19 1223  BP: 107/82 105/82 116/87 124/83  Pulse: 76 73 79 69  Resp: 17 17 16 16   Temp: 97.9 F (36.6 C) 97.7 F (36.5 C) (!) 97.4 F (36.3 C) 98.1 F (36.7 C)  TempSrc:   Oral Oral  SpO2: 100% 98% 100% 97%  Weight:      Height:        Intake/Output Summary (Last 24 hours) at 12/31/2019 1240 Last data filed at 12/31/2019 0900 Gross per 24 hour  Intake 360 ml  Output --  Net 360 ml   Filed Weights   12/21/19 0740 12/22/19 0400 12/27/19 0600  Weight: 99.7 kg 99.8 kg 95.3 kg    Examination:  General exam: Appears calm and comfortable  Respiratory system: Clear to auscultation. Respiratory effort normal. Cardiovascular system: S1 & S2 heard, RRR. No JVD, murmurs, rubs, gallops or clicks. 2+ pedal edema. Gastrointestinal system: Abdomen is nondistended, soft and nontender. No organomegaly or masses felt. Normal bowel sounds heard. Central nervous system: Alert and oriented x3. No focal neurological deficits. Extremities: Symmetric  Skin: No rashes, lesions or ulcers Psychiatry:  Mood & affect appropriate.     Data Reviewed: I have personally reviewed following labs and imaging studies  CBC: Recent Labs  Lab 12/26/19 0524 12/29/19 0505  WBC 5.1 4.3  HGB 9.6* 10.2*  HCT 32.5* 32.9*  MCV 76.8* 72.8*  PLT 334 673   Basic Metabolic Panel: Recent Labs  Lab 12/26/19 0524 12/27/19 0433 12/28/19 0618 12/29/19 0505 12/30/19 0542  NA 138 136 138 137 138  K 4.3 4.4 4.3 4.3 4.1  CL 101 101 104 102 102  CO2 26 25 24 25 25   GLUCOSE 113* 91 85 86 92  BUN 59* 58* 56* 57* 58*  CREATININE 3.52* 3.39* 3.20* 3.37* 3.48*  CALCIUM 8.9 9.2 9.1 9.1 9.7   GFR: Estimated Creatinine Clearance: 19 mL/min (A) (by C-G formula based on SCr of 3.48 mg/dL (H)). Liver Function Tests: No results for  input(s): AST, ALT, ALKPHOS, BILITOT, PROT, ALBUMIN in the last 168 hours. No results for input(s): LIPASE, AMYLASE in the last 168 hours. No results for input(s): AMMONIA in the last 168 hours. Coagulation Profile: No results for input(s): INR, PROTIME in the last 168 hours. Cardiac Enzymes: No results for input(s): CKTOTAL, CKMB, CKMBINDEX, TROPONINI in the last 168 hours. BNP (last 3 results) No results for input(s): PROBNP in the last 8760 hours. HbA1C: No results for input(s): HGBA1C in the last 72 hours. CBG: No results for input(s): GLUCAP in the last 168 hours. Lipid Profile: No results for input(s): CHOL, HDL, LDLCALC, TRIG, CHOLHDL, LDLDIRECT in the last 72 hours. Thyroid Function Tests: No results for input(s): TSH, T4TOTAL, FREET4, T3FREE, THYROIDAB in the last 72 hours. Anemia Panel: No results for input(s): VITAMINB12, FOLATE, FERRITIN, TIBC, IRON, RETICCTPCT in the last 72 hours. Sepsis Labs: No results for input(s): PROCALCITON, LATICACIDVEN in the last 168 hours.  No results found for this or any previous visit (from the past 240 hour(s)).       Radiology Studies:  No results found.      Scheduled Meds: . apixaban  5 mg Oral BID  . bumetanide  2 mg Oral BID  . ciprofloxacin  500 mg Oral Q breakfast  . epoetin (EPOGEN/PROCRIT) injection  20,000 Units Subcutaneous Weekly  . iron polysaccharides  150 mg Oral Daily  . ketotifen  1 drop Both Eyes BID  . LORazepam  1 mg Intramuscular Once  . mouth rinse  15 mL Mouth Rinse BID  . melatonin  2.5 mg Oral QHS  . midodrine  10 mg Oral TID WC  . multivitamin  1 tablet Oral QHS  . pantoprazole  40 mg Oral BID  . polyethylene glycol  17 g Oral Daily  . spironolactone  25 mg Oral Daily   Continuous Infusions:   LOS: 73 days    Time spent: 26 minutes    Sharen Hones, MD [facility]   To contact the attending provider between 7A-7P or the covering provider during after hours 7P-7A, please log into  the web site [email or web address] and access using universal [place] password for that web site. If you do not have the password, please call the hospital operator.  12/31/2019, 12:40 PM

## 2020-01-01 LAB — CBC
HCT: 34.8 % — ABNORMAL LOW (ref 36.0–46.0)
Hemoglobin: 10.3 g/dL — ABNORMAL LOW (ref 12.0–15.0)
MCH: 22.4 pg — ABNORMAL LOW (ref 26.0–34.0)
MCHC: 29.6 g/dL — ABNORMAL LOW (ref 30.0–36.0)
MCV: 75.8 fL — ABNORMAL LOW (ref 80.0–100.0)
Platelets: 360 10*3/uL (ref 150–400)
RBC: 4.59 MIL/uL (ref 3.87–5.11)
RDW: 19.7 % — ABNORMAL HIGH (ref 11.5–15.5)
WBC: 4.5 10*3/uL (ref 4.0–10.5)
nRBC: 0 % (ref 0.0–0.2)

## 2020-01-01 LAB — CREATININE, SERUM
Creatinine, Ser: 3.42 mg/dL — ABNORMAL HIGH (ref 0.44–1.00)
GFR, Estimated: 14 mL/min — ABNORMAL LOW (ref >60)

## 2020-01-01 NOTE — Progress Notes (Signed)
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 After admission to the hospital, she was diagnosed with acute on chronic combinedsystolic and diastolic congestive heart failurewith ejection fraction 25 to 30%. She was not responding to IV Lasix. She was seen by nephrology, temporary dialysis catheter was performed and she was dialyzed on 10/17 and 10/18.Patient also complaining of abdominal pain, she had a paracentesis, study showed possible spontaneous peritonitis, she was treated with Rocephin and Flagyl. Patient had a worsening respiratory status on 10/17, was diagnosed with aspiration pneumonia. She was treated with Rocephin, Zithromax and Flagyl.Patient also had abdominal pain and a CT scan suspect ischemia, but seen by general surgery on 10/19, no need for surgery. Patient was evaluated by psychiatry and she does not havedecision-making capacity. Family does not want to participate in making decisions for her. DSS case was filed for guardianship and still pending. Difficult disposition due to unsafe discharge. Nephrology team is also following. She was started on IV Lasix and spironolactone for volume overload. Currently on Bumex and spironolactone. Disposition pending.    Assessment & Plan:   Principal Problem:   Spontaneous bacterial peritonitis ([place]) Active Problems:   AKI (acute kidney injury) ([place])   Crack cocaine use   Acute respiratory failure with hypoxia (HCC)   LV (left ventricular) mural thrombus    Chronic kidney disease (CKD) stage G3b/A1, moderately decreased glomerular filtration rate (GFR) between 30-44 mL/min/1.73 square meter and albuminuria creatinine ratio less than 30 mg/g (HCC)   Nonsustained ventricular tachycardia (HCC)   Acute CHF (congestive heart failure) (HCC)   Cocaine abuse ([place])   Peripheral edema   Cirrhosis of liver (HCC)   Dementia (HCC)   Aspiration pneumonia of both lower lobes due to gastric secretions (HCC)   Acute diastolic (congestive) heart failure ([place])   Cardiorenal disease   Goals of care, counseling/discussion   Palliative care by specialist   Ascites due to alcoholic cirrhosis ([place])  #1.  Liver cirrhosis with spontaneous bacterial peritonitis and ascites. Patient does not have significant abdominal distention today, no need for another paracentesis.  Currently on Cipro for prophylactic treatment. Continue diuretics.  2.  Acute on chronic combined systolic and diastolic congestive heart failure. Condition still stable, patient still has leg edema, which is chronic in nature.  Continue oral diuretics.  3.  Acute hypoxemic respiratory failure. Condition has improved.  Patient still on nocturnal oxygen.  #4. Acute kidney injury on chronic kidney disease stage III P. Patient does not need hemodialysis at this time.  5.  Left ventricular thrombus. Continue Eliquis.   Discussed with Dr.Clapascs yesterday afternoon, patient still lacks decision-making capacity.  It is not appropriate to discharge patient to self-care does not have a residence.  Guardianship process is ongoing.   DVT prophylaxis: Eliquis Code Status: DNR Family Communication: None Disposition Plan:  .   Status is: Inpatient  Remains inpatient appropriate because:Unsafe d/c plan   Dispo: The patient is from: Home              Anticipated d/c is to: SNF  Anticipated d/c date is: > 3 days              Patient currently is medically stable to d/c.         I/O last 3 completed shifts: In: 240 [P.O.:240] Out: -  No intake/output data recorded.     Consultants:   Psych  Procedures: None  Antimicrobials: None  Subjective: Slept well last night with 2 L oxygen. No short of breath or cough. No abdominal pain nausea vomiting.  No significant abdominal distention. No fever chills.   Objective: Vitals:   12/31/19 2208 12/31/19 2209 01/01/20 0440 01/01/20 0818  BP: (!) 123/93  107/90 110/79  Pulse: 79  88 73  Resp: 17  16 15   Temp:  (!) 97.5 F (36.4 C) (!) 97.5 F (36.4 C) 98.1 F (36.7 C)  TempSrc:      SpO2: 100%  98% 100%  Weight:      Height:       No intake or output data in the 24 hours ending 01/01/20 0913 Filed Weights   12/21/19 0740 12/22/19 0400 12/27/19 0600  Weight: 99.7 kg 99.8 kg 95.3 kg    Examination:  General exam: Appears calm and comfortable  Respiratory system: Clear to auscultation. Respiratory effort normal. Cardiovascular system: S1 & S2 heard, RRR. No JVD, murmurs, rubs, gallops or clicks. No pedal edema. Gastrointestinal system: Abdomen is mildly distended, soft and nontender. No organomegaly or masses felt. Normal bowel sounds heard. Central nervous system: Alert and oriented. No focal neurological deficits. Extremities: Symmetric 5 x 5 power. Skin: No rashes, lesions or ulcers Psychiatry:  Mood & affect appropriate.     Data Reviewed: I have personally reviewed following labs and imaging studies  CBC: Recent Labs  Lab 12/26/19 0524 12/29/19 0505 01/01/20 0544  WBC 5.1 4.3 4.5  HGB 9.6* 10.2* 10.3*  HCT 32.5* 32.9* 34.8*  MCV 76.8* 72.8* 75.8*  PLT 334 318 683   Basic Metabolic Panel: Recent Labs  Lab 12/26/19 0524 12/27/19 0433 12/28/19 0618 12/29/19 0505 12/30/19 0542  NA 138 136 138 137 138  K 4.3 4.4 4.3 4.3 4.1  CL 101 101 104 102 102  CO2 26 25 24 25 25   GLUCOSE 113* 91 85 86 92  BUN 59* 58* 56* 57* 58*  CREATININE 3.52* 3.39* 3.20* 3.37* 3.48*  CALCIUM  8.9 9.2 9.1 9.1 9.7   GFR: Estimated Creatinine Clearance: 19 mL/min (A) (by C-G formula based on SCr of 3.48 mg/dL (H)). Liver Function Tests: No results for input(s): AST, ALT, ALKPHOS, BILITOT, PROT, ALBUMIN in the last 168 hours. No results for input(s): LIPASE, AMYLASE in the last 168 hours. No results for input(s): AMMONIA in the last 168 hours. Coagulation Profile: No results for input(s): INR, PROTIME in the last 168 hours. Cardiac Enzymes: No results for input(s): CKTOTAL, CKMB, CKMBINDEX, TROPONINI in the last 168 hours. BNP (last 3 results) No results for input(s): PROBNP in the last 8760 hours. HbA1C: No results for input(s): HGBA1C in the last 72 hours. CBG: No results for input(s): GLUCAP in the last 168 hours. Lipid Profile: No results for input(s): CHOL, HDL, LDLCALC, TRIG, CHOLHDL, LDLDIRECT in the last 72 hours. Thyroid Function Tests: No results for input(s): TSH, T4TOTAL, FREET4, T3FREE, THYROIDAB in the last 72 hours. Anemia Panel: No results for input(s): VITAMINB12, FOLATE, FERRITIN, TIBC, IRON, RETICCTPCT in the last 72 hours. Sepsis Labs: No results for input(s): PROCALCITON, LATICACIDVEN in the last 168 hours.  No results found  for this or any previous visit (from the past 240 hour(s)).       Radiology Studies: No results found.      Scheduled Meds: . apixaban  5 mg Oral BID  . bumetanide  2 mg Oral BID  . ciprofloxacin  500 mg Oral Q breakfast  . epoetin (EPOGEN/PROCRIT) injection  20,000 Units Subcutaneous Weekly  . iron polysaccharides  150 mg Oral Daily  . ketotifen  1 drop Both Eyes BID  . mouth rinse  15 mL Mouth Rinse BID  . melatonin  2.5 mg Oral QHS  . midodrine  10 mg Oral TID WC  . multivitamin  1 tablet Oral QHS  . pantoprazole  40 mg Oral BID  . polyethylene glycol  17 g Oral Daily  . spironolactone  25 mg Oral Daily   Continuous Infusions:   LOS: 74 days    Time spent: 24 minutes    Sharen Hones, MD [facility]   To contact the attending provider between 7A-7P or the covering provider during after hours 7P-7A, please log into the web site [email or web address] and access using universal [place] password for that web site. If you do not have the password, please call the hospital operator.  01/01/2020, 9:13 AM

## 2020-01-01 NOTE — Consult Note (Signed)
Subjective: Patient seen today for complaint of severely elongated, thick, painful toenails on all of her toes causing pressure in her socks as well as the bed sheets.  Was previously consulted earlier during her hospital stay.  Objective: All 10 toenails are severely elongated, thick, dystrophic, discolored, brittle with some subungual debris.  The hallux nails are ingrown bilateral with no active drainage.  Assessment: Painful onychomycosis with onychocryptosis  Plan: Debrided all 10 toenails.  Patient will follow-up outpatient as needed.

## 2020-01-02 NOTE — Progress Notes (Signed)
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 After admission to the hospital, she was diagnosed with acute on chronic combinedsystolic and diastolic congestive heart failurewith ejection fraction 25 to 30%. She was not responding to IV Lasix. She was seen by nephrology, temporary dialysis catheter was performed and she was dialyzed on 10/17 and 10/18.Patient also complaining of abdominal pain, she had a paracentesis, study showed possible spontaneous peritonitis, she was treated with Rocephin and Flagyl. Patient had a worsening respiratory status on 10/17, was diagnosed with aspiration pneumonia. She was treated with Rocephin, Zithromax and Flagyl.Patient also had abdominal pain and a CT scan suspect ischemia, but seen by general surgery on 10/19, no need for surgery. Patient was evaluated by psychiatry and she does not havedecision-making capacity. Family does not want to participate in making decisions for her. DSS case was filed for guardianship and still pending. Difficult disposition due to unsafe discharge. Nephrology team is also following. She was started on IV Lasix and spironolactone for volume overload. Currently on Bumex and spironolactone. Disposition pending.   Assessment & Plan:   Principal Problem:   Spontaneous bacterial peritonitis ([place]) Active Problems:   AKI (acute kidney injury) ([place])   Crack cocaine use   Acute respiratory failure with hypoxia (HCC)   LV (left ventricular) mural thrombus   Chronic  kidney disease (CKD) stage G3b/A1, moderately decreased glomerular filtration rate (GFR) between 30-44 mL/min/1.73 square meter and albuminuria creatinine ratio less than 30 mg/g (HCC)   Nonsustained ventricular tachycardia (HCC)   Acute CHF (congestive heart failure) (HCC)   Cocaine abuse ([place])   Peripheral edema   Cirrhosis of liver (HCC)   Dementia (HCC)   Aspiration pneumonia of both lower lobes due to gastric secretions (HCC)   Acute diastolic (congestive) heart failure ([place])   Cardiorenal disease   Goals of care, counseling/discussion   Palliative care by specialist   Ascites due to alcoholic cirrhosis ([place])  #1.  Liver cirrhosis with ascites, spontaneous bacterial peritonitis. Condition has been stable.  Currently on prophylactic Cipro. Patient having gradually worsening ascites, may need paracentesis in a couple of days. Continue diuretics.  2.  Pure acute on chronic combined systolic and diastolic congestive heart failure. Still stable, chronic leg edema.  Continue oral diuretics.  3.  Acute hypoxemic respiratory failure. Patient currently on nocturnal oxygen.  Otherwise had improved.  4.  Acute kidney injury on chronic kidney disease stage IIIb. Relatively stable.  5.  Left ventricular thrombus. Continue Eliquis.    DVT prophylaxis: Eliquis Code Status: DNR Family Communication:  Disposition Plan:  .   Status is: Inpatient  Remains inpatient appropriate because:Unsafe d/c plan   Dispo: The patient is from: Home              Anticipated d/c is to: SNF              Anticipated d/c date is: > 3 days              Patient currently is medically stable to d/c.        No intake/output data recorded. Total I/O  In: 240 [P.O.:240] Out: -      Consultants:   Psychiatry  Procedures: None  Antimicrobials: Cipro Subjective: Patient doing well today.  Still has leg edema, not short of breath or cough. No abdominal pain nausea vomiting. No dysuria  hematuria.  Objective: Vitals:   01/02/20 0416 01/02/20 0500 01/02/20 0803 01/02/20 1220  BP: (!) 109/94  110/78 115/79  Pulse: 87  93 78  Resp: 18  18 19   Temp: 98.1 F (36.7 C)  98.4 F (36.9 C) 98.7 F (37.1 C)  TempSrc: Oral     SpO2: 92%  100% 100%  Weight:  93.8 kg    Height:        Intake/Output Summary (Last 24 hours) at 01/02/2020 1236 Last data filed at 01/02/2020 1032 Gross per 24 hour  Intake 240 ml  Output --  Net 240 ml   Filed Weights   12/22/19 0400 12/27/19 0600 01/02/20 0500  Weight: 99.8 kg 95.3 kg 93.8 kg    Examination:  General exam: Appears calm and comfortable  Respiratory system: Clear to auscultation. Respiratory effort normal. Cardiovascular system: S1 & S2 heard, RRR. No JVD, murmurs, rubs, gallops or clicks. No pedal edema. Gastrointestinal system: Abdomen is distended, soft and nontender. No organomegaly or masses felt. Normal bowel sounds heard. Central nervous system: Alert and oriented. No focal neurological deficits. Extremities: Symmetric 5 x 5 power. Skin: No rashes, lesions or ulcers Psychiatry: Judgement and insight appear normal. Mood & affect appropriate.     Data Reviewed: I have personally reviewed following labs and imaging studies  CBC: Recent Labs  Lab 12/29/19 0505 01/01/20 0544  WBC 4.3 4.5  HGB 10.2* 10.3*  HCT 32.9* 34.8*  MCV 72.8* 75.8*  PLT 318 761   Basic Metabolic Panel: Recent Labs  Lab 12/27/19 0433 12/28/19 0618 12/29/19 0505 12/30/19 0542 01/01/20 0544  NA 136 138 137 138  --   K 4.4 4.3 4.3 4.1  --   CL 101 104 102 102  --   CO2 25 24 25 25   --   GLUCOSE 91 85 86 92  --   BUN 58* 56* 57* 58*  --   CREATININE 3.39* 3.20* 3.37* 3.48* 3.42*  CALCIUM 9.2 9.1 9.1 9.7  --    GFR: Estimated Creatinine Clearance: 19.2 mL/min (A) (by C-G formula based on SCr of 3.42 mg/dL (H)). Liver Function Tests: No results for input(s): AST, ALT, ALKPHOS, BILITOT, PROT, ALBUMIN in the last 168 hours. No  results for input(s): LIPASE, AMYLASE in the last 168 hours. No results for input(s): AMMONIA in the last 168 hours. Coagulation Profile: No results for input(s): INR, PROTIME in the last 168 hours. Cardiac Enzymes: No results for input(s): CKTOTAL, CKMB, CKMBINDEX, TROPONINI in the last 168 hours. BNP (last 3 results) No results for input(s): PROBNP in the last 8760 hours. HbA1C: No results for input(s): HGBA1C in the last 72 hours. CBG: No results for input(s): GLUCAP in the last 168 hours. Lipid Profile: No results for input(s): CHOL, HDL, LDLCALC, TRIG, CHOLHDL, LDLDIRECT in the last 72 hours. Thyroid Function Tests: No results for input(s): TSH, T4TOTAL, FREET4, T3FREE, THYROIDAB in the last 72 hours. Anemia Panel: No results for input(s): VITAMINB12, FOLATE, FERRITIN, TIBC, IRON, RETICCTPCT in the last 72 hours. Sepsis Labs: No results for input(s): PROCALCITON, LATICACIDVEN in the last 168 hours.  No results found for this or any previous visit (from the past 240 hour(s)).       Radiology Studies: No  results found.      Scheduled Meds: . apixaban  5 mg Oral BID  . bumetanide  2 mg Oral BID  . ciprofloxacin  500 mg Oral Q breakfast  . epoetin (EPOGEN/PROCRIT) injection  20,000 Units Subcutaneous Weekly  . iron polysaccharides  150 mg Oral Daily  . ketotifen  1 drop Both Eyes BID  . mouth rinse  15 mL Mouth Rinse BID  . melatonin  2.5 mg Oral QHS  . midodrine  10 mg Oral TID WC  . multivitamin  1 tablet Oral QHS  . pantoprazole  40 mg Oral BID  . polyethylene glycol  17 g Oral Daily  . spironolactone  25 mg Oral Daily   Continuous Infusions:   LOS: 75 days    Time spent: 27 minutes    Sharen Hones, MD [facility]   To contact the attending provider between 7A-7P or the covering provider during after hours 7P-7A, please log into the web site [email or web address] and access using universal [place] password for that web site. If you do not have the  password, please call the hospital operator.  01/02/2020, 12:36 PM

## 2020-01-02 NOTE — Progress Notes (Signed)
Katelyn Lane visited pt. after pt. called him into rm. while [facility] visiting pt. in adjacent rm.  Pt. sitting up in chair at bedside wearing a sweatshirt and pants she said was given to her today as a Christmas gift.  Pt. grateful for several gifts sitting on windowsill and for 1[facility] staff's care of her.  She says she is still uncertain where she will live once she is discharged from the hospital.  Pt. says she did not sleep well last night and that she hopes to have a nap this AP.  [place] prayed for pt. per Pt.'s request at end of visit.  CH remains available as needed.

## 2020-01-03 NOTE — Progress Notes (Signed)
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 After admission to the hospital, she was diagnosed with acute on chronic combinedsystolic and diastolic congestive heart failurewith ejection fraction 25 to 30%. She was not responding to IV Lasix. She was seen by nephrology, temporary dialysis catheter was performed and she was dialyzed on 10/17 and 10/18.Patient also complaining of abdominal pain, she had a paracentesis, study showed possible spontaneous peritonitis, she was treated with Rocephin and Flagyl. Patient had a worsening respiratory status on 10/17, was diagnosed with aspiration pneumonia. She was treated with Rocephin, Zithromax and Flagyl.Patient also had abdominal pain and a CT scan suspect ischemia, but seen by general surgery on 10/19, no need for surgery. Patient was evaluated by psychiatry and she does not havedecision-making capacity. Family does not want to participate in making decisions for her. DSS case was filed for guardianship and still pending. Difficult disposition due to unsafe discharge. Nephrology team is also following. She was started on IV Lasix and spironolactone for volume overload. Currently on Bumex and spironolactone. Disposition pending.   Assessment & Plan:   Principal Problem:   Spontaneous bacterial peritonitis ([place]) Active Problems:   AKI (acute kidney injury) ([place])   Crack cocaine use   Acute respiratory failure with hypoxia (HCC)   LV (left ventricular) mural thrombus   Chronic  kidney disease (CKD) stage G3b/A1, moderately decreased glomerular filtration rate (GFR) between 30-44 mL/min/1.73 square meter and albuminuria creatinine ratio less than 30 mg/g (HCC)   Nonsustained ventricular tachycardia (HCC)   Acute CHF (congestive heart failure) (HCC)   Cocaine abuse ([place])   Peripheral edema   Cirrhosis of liver (HCC)   Dementia (HCC)   Aspiration pneumonia of both lower lobes due to gastric secretions (HCC)   Acute diastolic (congestive) heart failure ([place])   Cardiorenal disease   Goals of care, counseling/discussion   Palliative care by specialist   Ascites due to alcoholic cirrhosis ([place])  #1.  Alcohol liver cirrhosis with ascites, spontaneous bacterial peritonitis. Condition still stable, continue prophylactic Cipro. Patient will be followed for needs of paracentesis.  2.  Acute on chronic combined systolic and diastolic congestive heart failure. Continue oral diuretics.  3.  Acute hypoxemic respiratory failure. Improved, patient still need nocturnal oxygen.  4.  Acute kidney injury on chronic kidney disease stage IIIb. Patient does not need dialysis this time.  5.  Left ventricular thrombus. Continue Eliquis.     DVT prophylaxis: Eliquis Code Status: DNR Family Communication:  Disposition Plan:     Status is: Inpatient  Remains inpatient appropriate because:Unsafe d/c plan   Dispo: The patient is from: Home  Anticipated d/c is to: SNF  Anticipated d/c date is: > 3 days  Patient currently is medically stable to d/c.        I/O last 3 completed shifts: In: 600 [P.O.:600] Out: -  Total I/O In: 340 [P.O.:340] Out: -      Subjective: Patient condition still stable.  Still has leg edema, but no short of breath.  He is requiring nocturnal oxygen. Denies any fever or chills. No abdominal pain or nausea vomiting.  Objective: Vitals:   01/02/20 2356 01/03/20 0447 01/03/20 0451 01/03/20  0824  BP: (!) 119/104 111/87  103/82  Pulse: 77 76  83  Resp: 19 18  20   Temp: 97.7 F (36.5 C) 97.9 F (36.6 C)  98.3 F (36.8 C)  TempSrc:    Oral  SpO2: 100% 100%  99%  Weight:   96.7 kg   Height:        Intake/Output Summary (Last 24 hours) at 01/03/2020 1000 Last data filed at 01/03/2020 0800 Gross per 24 hour  Intake 940 ml  Output --  Net 940 ml   Filed Weights   12/27/19 0600 01/02/20 0500 01/03/20 0451  Weight: 95.3 kg 93.8 kg 96.7 kg    Examination:  General exam: Appears calm and comfortable  Respiratory system: Clear to auscultation. Respiratory effort normal. Cardiovascular system: S1 & S2 heard, RRR. No JVD, murmurs, rubs, gallops or clicks.  Gastrointestinal system: Abdomen is nondistended, soft and nontender. No organomegaly or masses felt. Normal bowel sounds heard. Central nervous system: Alert and oriented. No focal neurological deficits. Extremities: 2+ leg edema Skin: No rashes, lesions or ulcers Psychiatry: Judgement and insight appear normal. Mood & affect appropriate.     Data Reviewed: I have personally reviewed following labs and imaging studies  CBC: Recent Labs  Lab 12/29/19 0505 01/01/20 0544  WBC 4.3 4.5  HGB 10.2* 10.3*  HCT 32.9* 34.8*  MCV 72.8* 75.8*  PLT 318 474   Basic Metabolic Panel: Recent Labs  Lab 12/28/19 0618 12/29/19 0505 12/30/19 0542 01/01/20 0544  NA 138 137 138  --   K 4.3 4.3 4.1  --   CL 104 102 102  --   CO2 24 25 25   --   GLUCOSE 85 86 92  --   BUN 56* 57* 58*  --   CREATININE 3.20* 3.37* 3.48* 3.42*  CALCIUM 9.1 9.1 9.7  --    GFR: Estimated Creatinine Clearance: 19.5 mL/min (A) (by C-G formula based on SCr of 3.42 mg/dL (H)). Liver Function Tests: No results for input(s): AST, ALT, ALKPHOS, BILITOT, PROT, ALBUMIN in the last 168 hours. No results for input(s): LIPASE, AMYLASE in the last 168 hours. No results for input(s): AMMONIA in the last 168 hours. Coagulation Profile: No results for  input(s): INR, PROTIME in the last 168 hours. Cardiac Enzymes: No results for input(s): CKTOTAL, CKMB, CKMBINDEX, TROPONINI in the last 168 hours. BNP (last 3 results) No results for input(s): PROBNP in the last 8760 hours. HbA1C: No results for input(s): HGBA1C in the last 72 hours. CBG: No results for input(s): GLUCAP in the last 168 hours. Lipid Profile: No results for input(s): CHOL, HDL, LDLCALC, TRIG, CHOLHDL, LDLDIRECT in the last 72 hours. Thyroid Function Tests: No results for input(s): TSH, T4TOTAL, FREET4, T3FREE, THYROIDAB in the last 72 hours. Anemia Panel: No results for input(s): VITAMINB12, FOLATE, FERRITIN, TIBC, IRON, RETICCTPCT in the last 72 hours. Sepsis Labs: No results for input(s): PROCALCITON, LATICACIDVEN in the last 168 hours.  No results found for this or any previous visit (from the past 240 hour(s)).       Radiology Studies: No results found.      Scheduled Meds: . apixaban  5 mg Oral BID  . bumetanide  2 mg Oral BID  . ciprofloxacin  500 mg Oral Q breakfast  . epoetin (EPOGEN/PROCRIT) injection  20,000 Units Subcutaneous Weekly  . iron polysaccharides  150 mg Oral Daily  . ketotifen  1 drop Both  Eyes BID  . mouth rinse  15 mL Mouth Rinse BID  . melatonin  2.5 mg Oral QHS  . midodrine  10 mg Oral TID WC  . multivitamin  1 tablet Oral QHS  . pantoprazole  40 mg Oral BID  . polyethylene glycol  17 g Oral Daily  . spironolactone  25 mg Oral Daily   Continuous Infusions:   LOS: 76 days    Time spent: 27 minutes    Sharen Hones, MD [facility]   To contact the attending provider between 7A-7P or the covering provider during after hours 7P-7A, please log into the web site [email or web address] and access using universal [place] password for that web site. If you do not have the password, please call the hospital operator.  01/03/2020, 10:00 AM

## 2020-01-04 NOTE — Progress Notes (Signed)
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 After admission to the hospital, she was diagnosed with acute on chronic combinedsystolic and diastolic congestive heart failurewith ejection fraction 25 to 30%. She was not responding to IV Lasix. She was seen by nephrology, temporary dialysis catheter was performed and she was dialyzed on 10/17 and 10/18.Patient also complaining of abdominal pain, she had a paracentesis, study showed possible spontaneous peritonitis, she was treated with Rocephin and Flagyl. Patient had a worsening respiratory status on 10/17, was diagnosed with aspiration pneumonia. She was treated with Rocephin, Zithromax and Flagyl.Patient also had abdominal pain and a CT scan suspect ischemia, but seen by general surgery on 10/19, no need for surgery. Patient was evaluated by psychiatry and she does not havedecision-making capacity. Family does not want to participate in making decisions for her. DSS case was filed for guardianship and still pending. Difficult disposition due to unsafe discharge. Nephrology team is also following. She was started on IV Lasix and spironolactone for volume overload. Currently on Bumex and spironolactone. Disposition pending.   Assessment & Plan:   Principal Problem:   Spontaneous bacterial peritonitis ([place]) Active Problems:   AKI (acute kidney injury) ([place])   Crack cocaine use   Acute respiratory failure with hypoxia (HCC)   LV (left ventricular) mural thrombus   Chronic  kidney disease (CKD) stage G3b/A1, moderately decreased glomerular filtration rate (GFR) between 30-44 mL/min/1.73 square meter and albuminuria creatinine ratio less than 30 mg/g (HCC)   Nonsustained ventricular tachycardia (HCC)   Acute CHF (congestive heart failure) (HCC)   Cocaine abuse ([place])   Peripheral edema   Cirrhosis of liver (HCC)   Dementia (HCC)   Aspiration pneumonia of both lower lobes due to gastric secretions (HCC)   Acute diastolic (congestive) heart failure ([place])   Cardiorenal disease   Goals of care, counseling/discussion   Palliative care by specialist   Ascites due to alcoholic cirrhosis ([place])  #1.  Alcohol liver cirrhosis with ascites, spontaneous bacterial peritonitis per Patient condition still stable today, he is on prophylactic Cipro. She does not need paracentesis today.  2.  Pure acute on chronic combined systolic and diastolic congestive heart failure. She is on oral diuretics.  3.  Acute hypoxemic respiratory failure. Largely improved, on nocturnal oxygen.  #4 pure acute kidney injury on chronic kidney disease stage IIIb. Renal function relatively stable.  5.  Left ventricular thrombus. Continue Eliquis    DVT prophylaxis: Eliquis Code Status: DNR Family Communication:  Disposition Plan:  .   Status is: Inpatient  Remains inpatient appropriate because:Unsafe d/c plan   Dispo: The patient is from: Home              Anticipated d/c is to: SNF              Anticipated d/c date is: > 3 days              Patient currently is medically stable to d/c.        I/O last 3 completed shifts: In: 1140 [P.O.:1140] Out: -  No intake/output data recorded.  Consultants:   None  Procedures: None  Antimicrobials:  Cipro  Subjective: Patient is doing well today.  She was able to go downstairs and went outside with the nurse yesterday. He denies any short of breath or cough. No abdominal pain or nausea  vomiting.   Objective: Vitals:   01/03/20 2129 01/04/20 0041 01/04/20 0611 01/04/20 0839  BP: (!) 124/95 (!) 113/93 (!) 117/97 111/84  Pulse: 78 72 84 79  Resp: 17 16 20 18   Temp: 98.6 F (37 C) 97.8 F (36.6 C) 97.7 F (36.5 C) 97.7 F (36.5 C)  TempSrc:   Oral Oral  SpO2: 97% 100% 95% 99%  Weight:      Height:        Intake/Output Summary (Last 24 hours) at 01/04/2020 1010 Last data filed at 01/03/2020 1700 Gross per 24 hour  Intake 680 ml  Output --  Net 680 ml   Filed Weights   12/27/19 0600 01/02/20 0500 01/03/20 0451  Weight: 95.3 kg 93.8 kg 96.7 kg    Examination:  General exam: Appears calm and comfortable  Respiratory system: Clear to auscultation. Respiratory effort normal. Cardiovascular system: S1 & S2 heard, RRR. No JVD, murmurs, rubs, gallops or clicks.  Gastrointestinal system: Abdomen is nondistended, soft and nontender. No organomegaly or masses felt. Normal bowel sounds heard. Central nervous system: Alert and oriented. No focal neurological deficits. Extremities: 2+ leg edema Skin: No rashes, lesions or ulcers Psychiatry: Judgement and insight appear normal. Mood & affect appropriate.     Data Reviewed: I have personally reviewed following labs and imaging studies  CBC: Recent Labs  Lab 12/29/19 0505 01/01/20 0544  WBC 4.3 4.5  HGB 10.2* 10.3*  HCT 32.9* 34.8*  MCV 72.8* 75.8*  PLT 318 607   Basic Metabolic Panel: Recent Labs  Lab 12/29/19 0505 12/30/19 0542 01/01/20 0544  NA 137 138  --   K 4.3 4.1  --   CL 102 102  --   CO2 25 25  --   GLUCOSE 86 92  --   BUN 57* 58*  --   CREATININE 3.37* 3.48* 3.42*  CALCIUM 9.1 9.7  --    GFR: Estimated Creatinine Clearance: 19.5 mL/min (A) (by C-G formula based on SCr of 3.42 mg/dL (H)). Liver Function Tests: No results for input(s): AST, ALT, ALKPHOS, BILITOT, PROT, ALBUMIN in the last 168 hours. No results for input(s): LIPASE, AMYLASE in the last 168 hours. No results for  input(s): AMMONIA in the last 168 hours. Coagulation Profile: No results for input(s): INR, PROTIME in the last 168 hours. Cardiac Enzymes: No results for input(s): CKTOTAL, CKMB, CKMBINDEX, TROPONINI in the last 168 hours. BNP (last 3 results) No results for input(s): PROBNP in the last 8760 hours. HbA1C: No results for input(s): HGBA1C in the last 72 hours. CBG: No results for input(s): GLUCAP in the last 168 hours. Lipid Profile: No results for input(s): CHOL, HDL, LDLCALC, TRIG, CHOLHDL, LDLDIRECT in the last 72 hours. Thyroid Function Tests: No results for input(s): TSH, T4TOTAL, FREET4, T3FREE, THYROIDAB in the last 72 hours. Anemia Panel: No results for input(s): VITAMINB12, FOLATE, FERRITIN, TIBC, IRON, RETICCTPCT in the last 72 hours. Sepsis Labs: No results for input(s): PROCALCITON, LATICACIDVEN in the last 168 hours.  No results found for this or any previous visit (from the past 240 hour(s)).       Radiology Studies: No results found.      Scheduled Meds: . apixaban  5 mg Oral BID  .  bumetanide  2 mg Oral BID  . ciprofloxacin  500 mg Oral Q breakfast  . epoetin (EPOGEN/PROCRIT) injection  20,000 Units Subcutaneous Weekly  . iron polysaccharides  150 mg Oral Daily  . ketotifen  1 drop Both Eyes BID  . mouth rinse  15 mL Mouth Rinse BID  . melatonin  2.5 mg Oral QHS  . midodrine  10 mg Oral TID WC  . multivitamin  1 tablet Oral QHS  . pantoprazole  40 mg Oral BID  . polyethylene glycol  17 g Oral Daily  . spironolactone  25 mg Oral Daily   Continuous Infusions:   LOS: 77 days    Time spent: 25 minutes    Sharen Hones, MD [facility]   To contact the attending provider between 7A-7P or the covering provider during after hours 7P-7A, please log into the web site [email or web address] and access using universal [place] password for that web site. If you do not have the password, please call the hospital operator.  01/04/2020, 10:10 AM

## 2020-01-05 LAB — CBC
HCT: 32.7 % — ABNORMAL LOW (ref 36.0–46.0)
Hemoglobin: 9.8 g/dL — ABNORMAL LOW (ref 12.0–15.0)
MCH: 22.3 pg — ABNORMAL LOW (ref 26.0–34.0)
MCHC: 30 g/dL (ref 30.0–36.0)
MCV: 74.3 fL — ABNORMAL LOW (ref 80.0–100.0)
Platelets: 361 10*3/uL (ref 150–400)
RBC: 4.4 MIL/uL (ref 3.87–5.11)
RDW: 18.7 % — ABNORMAL HIGH (ref 11.5–15.5)
WBC: 4.8 10*3/uL (ref 4.0–10.5)
nRBC: 0 % (ref 0.0–0.2)

## 2020-01-05 NOTE — TOC Progression Note (Signed)
Transition of Care [facility]) - Progression Note    Patient Details  Name: Katelyn Lane MRN: [id number] Date of Birth: 1955/07/09  Transition of Care [facility]) CM/SW [place], RN Phone Number: 01/05/2020, 2:33 PM  Clinical Narrative:    Damaris Schooner to Danae Chen @ [facility] regarding possible placement. Danae Chen agreed to come evaluate patient tomorrow. Sent requested information to [email or web address] secure email.         Expected Discharge Plan and Services                                                 Social Determinants of Health (SDOH) Interventions    Readmission Risk Interventions Readmission Risk Prevention Plan 09/02/2019 08/14/2019 07/27/2019  Transportation Screening Complete Complete Complete  Medication Review Press photographer) Complete Referral to Pharmacy -  PCP or Specialist appointment within 3-5 days of discharge Complete Complete Complete  HRI or Home Care Consult - Complete Complete  SW Recovery Care/Counseling Consult - Complete Complete  Palliative Care Screening Not Applicable Not Applicable Not Applicable  Skilled Nursing Facility Complete Not Applicable Not Applicable  Some recent data might be hidden

## 2020-01-05 NOTE — TOC Progression Note (Signed)
Transition of Care [facility]) - Progression Note    Patient Details  Name: Katelyn Lane MRN: [id number] Date of Birth: Mar 15, 1955  Transition of Care [facility]) CM/SW [place], RN Phone Number: 01/05/2020, 11:41 AM  Clinical Narrative:    Spoke with patient at bedside in great detail. Patient taking very active role in her care and adamant that she is not going to be placed outside of [place] or [place]. Upon mentioning possibility of [place] placement patient became very upset with Probation officer and stated she would leave herself. Writer discussed any options she had for leaving hospital and patient states she could find somewhere but was unable to give specifics. Patient later mentioned she had a car and writer asked if she planned to live in her car. Patient states she did for a while but she did not want to do that if possible. Patient informed RN CM that she felt like hospital was not looking and that she knew the government had many options to take care of her and that "everyone else was getting benefits and she deserved them as well". RN CM attempted to explain possibility of having to look outside of requested area and patient again became aggressive to writer updating that she would just leave hospital. RN CM confirmed for patient that she was not IVC and was able to leave AMA if she requested however hospital did not feel this was a safe discharge as patient had no place to live. Writer reminded patient that DSS was planning to come see patient and discuss potential guardianship however it could be up to 60 days. Patient again became upset and stated "she was not crazy and did not have any trouble caring for herself".  Patient stated she could find somewhere and she wants to call her sister and ask if she can live in their mothers house. RN CM offered to make call to her sister and patient very appreciative. Patient remained very oriented and focused throughout conversation although she  does remain adamant she may leave AMA. Patient clearly understands that she has no resources available if she chooses this path. TOC will follow up with patient tomorrow after outreaching to sister and potential group homes in patients requested area.         Expected Discharge Plan and Services                                                 Social Determinants of Health (SDOH) Interventions    Readmission Risk Interventions Readmission Risk Prevention Plan 09/02/2019 08/14/2019 07/27/2019  Transportation Screening Complete Complete Complete  Medication Review Press photographer) Complete Referral to Pharmacy -  PCP or Specialist appointment within 3-5 days of discharge Complete Complete Complete  HRI or Home Care Consult - Complete Complete  SW Recovery Care/Counseling Consult - Complete Complete  Palliative Care Screening Not Applicable Not Applicable Not Applicable  Skilled Nursing Facility Complete Not Applicable Not Applicable  Some recent data might be hidden

## 2020-01-05 NOTE — Progress Notes (Signed)
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 After admission to the hospital, she was diagnosed with acute on chronic combinedsystolic and diastolic congestive heart failurewith ejection fraction 25 to 30%. She was not responding to IV Lasix. She was seen by nephrology, temporary dialysis catheter was performed and she was dialyzed on 10/17 and 10/18.Patient also complaining of abdominal pain, she had a paracentesis, study showed possible spontaneous peritonitis, she was treated with Rocephin and Flagyl. Patient had a worsening respiratory status on 10/17, was diagnosed with aspiration pneumonia. She was treated with Rocephin, Zithromax and Flagyl.Patient also had abdominal pain and a CT scan suspect ischemia, but seen by general surgery on 10/19, no need for surgery. Patient was evaluated by psychiatry and she does not havedecision-making capacity. Family does not want to participate in making decisions for her. DSS case was filed for guardianship and still pending. Difficult disposition due to unsafe discharge. Nephrology team is also following. She was started on IV Lasix and spironolactone for volume overload. Currently on Bumex and spironolactone. Pending guardianship.    Assessment & Plan:   Principal Problem:   Spontaneous bacterial peritonitis ([place]) Active Problems:   AKI (acute kidney injury) ([place])   Crack cocaine use   Acute respiratory failure with hypoxia (HCC)   LV (left ventricular) mural thrombus    Chronic kidney disease (CKD) stage G3b/A1, moderately decreased glomerular filtration rate (GFR) between 30-44 mL/min/1.73 square meter and albuminuria creatinine ratio less than 30 mg/g (HCC)   Nonsustained ventricular tachycardia (HCC)   Acute CHF (congestive heart failure) (HCC)   Cocaine abuse ([place])   Peripheral edema   Cirrhosis of liver (HCC)   Dementia (HCC)   Aspiration pneumonia of both lower lobes due to gastric secretions (HCC)   Acute diastolic (congestive) heart failure ([place])   Cardiorenal disease   Goals of care, counseling/discussion   Palliative care by specialist   Ascites due to alcoholic cirrhosis ([place])   #1.  Alcoholic liver cirrhosis with ascites.  Spontaneous bacterial peritonitis. Patient condition still stable.  She is on prophylactic Cipro. She is slowly accumulating ascites, may need to repeat it paracentesis.  2.  Acute on chronic combined systolic and diastolic congestive heart failure. Currently on oral diuretics.  She still has significant leg edema.  3.  Acute hypoxemic respiratory failure. Condition has improved.  Currently she is on nocturnal oxygen occasionally.  4.  Acute kidney injury on chronic kidney disease stage IIIb. Renal function still stable.  5.  Left ventricular thrombus. On Eliquis.    DVT prophylaxis: Eliquis Code Status: DNR Family Communication:  Disposition Plan:     Status is: Inpatient  Remains inpatient appropriate because:Unsafe d/c plan   Dispo: The patient is from: Home  Anticipated d/c is to: SNF  Anticipated d/c date is: > 3 days  Patient currently is medically stable to d/c.      I/O last 3 completed shifts: In: 120 [P.O.:120] Out: -  No intake/output data recorded.     Consultants:   None  Procedures: None  Antimicrobials: Cipro  Subjective: There is no change in patient condition.  She still complaining of leg edema, but denies any short of  breath or cough. No abdominal pain or nausea vomiting.  She had normal looking bowel movements. No fever or chills  Objective: Vitals:   01/04/20 2345 01/05/20 0339 01/05/20 0344 01/05/20 0729  BP: (!) 117/98 (!) 117/98  113/87  Pulse: 75 74  66  Resp: 18 18  20   Temp: 98.3 F (36.8 C) 97.6 F (36.4 C)  (!) 97.5 F (36.4 C)  TempSrc:      SpO2: 97% 100%  100%  Weight:   95.3 kg   Height:        Intake/Output Summary (Last 24 hours) at 01/05/2020 0952 Last data filed at 01/04/2020 1424 Gross per 24 hour  Intake 120 ml  Output --  Net 120 ml   Filed Weights   01/02/20 0500 01/03/20 0451 01/05/20 0344  Weight: 93.8 kg 96.7 kg 95.3 kg    Examination:  General exam: Appears calm and comfortable  Respiratory system: Clear to auscultation. Respiratory effort normal. Cardiovascular system: S1 & S2 heard, RRR. No JVD, murmurs, rubs, gallops or clicks.  Gastrointestinal system: Abdomen is distended, soft and nontender. No organomegaly or masses felt. Normal bowel sounds heard. Central nervous system: Alert and oriented. No focal neurological deficits. Extremities: 2-3+leg edema Skin: No rashes, lesions or ulcers Psychiatry: Judgement and insight appear normal. Mood & affect appropriate.     Data Reviewed: I have personally reviewed following labs and imaging studies  CBC: Recent Labs  Lab 01/01/20 0544 01/05/20 0653  WBC 4.5 4.8  HGB 10.3* 9.8*  HCT 34.8* 32.7*  MCV 75.8* 74.3*  PLT 360 262   Basic Metabolic Panel: Recent Labs  Lab 12/30/19 0542 01/01/20 0544  NA 138  --   K 4.1  --   CL 102  --   CO2 25  --   GLUCOSE 92  --   BUN 58*  --   CREATININE 3.48* 3.42*  CALCIUM 9.7  --    GFR: Estimated Creatinine Clearance: 19.3 mL/min (A) (by C-G formula based on SCr of 3.42 mg/dL (H)). Liver Function Tests: No results for input(s): AST, ALT, ALKPHOS, BILITOT, PROT, ALBUMIN in the last 168 hours. No results for input(s): LIPASE, AMYLASE in the last 168  hours. No results for input(s): AMMONIA in the last 168 hours. Coagulation Profile: No results for input(s): INR, PROTIME in the last 168 hours. Cardiac Enzymes: No results for input(s): CKTOTAL, CKMB, CKMBINDEX, TROPONINI in the last 168 hours. BNP (last 3 results) No results for input(s): PROBNP in the last 8760 hours. HbA1C: No results for input(s): HGBA1C in the last 72 hours. CBG: No results for input(s): GLUCAP in the last 168 hours. Lipid Profile: No results for input(s): CHOL, HDL, LDLCALC, TRIG, CHOLHDL, LDLDIRECT in the last 72 hours. Thyroid Function Tests: No results for input(s): TSH, T4TOTAL, FREET4, T3FREE, THYROIDAB in the last 72 hours. Anemia Panel: No results for input(s): VITAMINB12, FOLATE, FERRITIN, TIBC, IRON, RETICCTPCT in the last 72 hours. Sepsis Labs: No results for input(s): PROCALCITON, LATICACIDVEN in the last 168 hours.  No results found for this or any previous visit (from the past 240 hour(s)).       Radiology Studies: No results found.      Scheduled Meds: . apixaban  5 mg Oral BID  . bumetanide  2 mg Oral BID  . ciprofloxacin  500 mg Oral Q breakfast  . epoetin (EPOGEN/PROCRIT) injection  20,000 Units Subcutaneous Weekly  . iron  polysaccharides  150 mg Oral Daily  . ketotifen  1 drop Both Eyes BID  . mouth rinse  15 mL Mouth Rinse BID  . melatonin  2.5 mg Oral QHS  . midodrine  10 mg Oral TID WC  . multivitamin  1 tablet Oral QHS  . pantoprazole  40 mg Oral BID  . polyethylene glycol  17 g Oral Daily  . spironolactone  25 mg Oral Daily   Continuous Infusions:   LOS: 78 days    Time spent: 25 minutes    Sharen Hones, MD [facility]   To contact the attending provider between 7A-7P or the covering provider during after hours 7P-7A, please log into the web site [email or web address] and access using universal [place] password for that web site. If you do not have the password, please call the hospital  operator.  01/05/2020, 9:52 AM

## 2020-01-05 NOTE — Plan of Care (Signed)
  Problem: Education: Goal: Knowledge of General Education information will improve Description: Including pain rating scale, medication(s)/side effects and non-pharmacologic comfort measures Outcome: Progressing   Problem: Health Behavior/Discharge Planning: Goal: Ability to manage health-related needs will improve Outcome: Progressing   Problem: Clinical Measurements: Goal: Ability to maintain clinical measurements within normal limits will improve Outcome: Progressing Goal: Will remain free from infection Outcome: Progressing Goal: Diagnostic test results will improve Outcome: Progressing Goal: Respiratory complications will improve Outcome: Progressing Goal: Cardiovascular complication will be avoided Outcome: Progressing   Problem: Activity: Goal: Risk for activity intolerance will decrease Outcome: Progressing   Problem: Nutrition: Goal: Adequate nutrition will be maintained Outcome: Progressing   Problem: Coping: Goal: Level of anxiety will decrease Outcome: Progressing   Problem: Elimination: Goal: Will not experience complications related to bowel motility Outcome: Progressing Goal: Will not experience complications related to urinary retention Outcome: Progressing   Problem: Pain Managment: Goal: General experience of comfort will improve Outcome: Progressing   Problem: Safety: Goal: Ability to remain free from injury will improve Outcome: Progressing   Problem: Skin Integrity: Goal: Risk for impaired skin integrity will decrease Outcome: Progressing   Problem: Education: Goal: Ability to demonstrate management of disease process will improve Outcome: Progressing Goal: Ability to verbalize understanding of medication therapies will improve Outcome: Progressing Goal: Individualized Educational Video(s) Outcome: Progressing   Problem: Activity: Goal: Capacity to carry out activities will improve Outcome: Progressing   Problem: Cardiac: Goal:  Ability to achieve and maintain adequate cardiopulmonary perfusion will improve Outcome: Progressing   Problem: Education: Goal: Knowledge of disease and its progression will improve Outcome: Progressing Goal: Individualized Educational Video(s) Outcome: Progressing   Problem: Fluid Volume: Goal: Compliance with measures to maintain balanced fluid volume will improve Outcome: Progressing   Problem: Health Behavior/Discharge Planning: Goal: Ability to manage health-related needs will improve Outcome: Progressing   Problem: Nutritional: Goal: Ability to make healthy dietary choices will improve Outcome: Progressing   Problem: Clinical Measurements: Goal: Complications related to the disease process, condition or treatment will be avoided or minimized Outcome: Progressing

## 2020-01-05 NOTE — Progress Notes (Signed)
VAST consulted to obtain IV access. Pt currently has no IV meds/fluids, blood, or studies ordered. Spoke with Jarrett Soho, pt's nurse and Dr. Roosevelt Locks, MD via [place]; educated it is best practice not to place an IV that is not currently needed to decrease infection risk and allow for vein preservation. Further educated if pt's condition changes and IV access is needed emergently, IV team consult should be placed STAT with a comment as to why an emergent IV is needed. Jarrett Soho, pt's nurse, as well as attending, Dr. Roosevelt Locks in agreement with plan.

## 2020-01-05 NOTE — Progress Notes (Signed)
Nutrition Follow-up  DOCUMENTATION CODES:   Obesity unspecified  INTERVENTION:  Patient continues to decline all nutrition interventions.  Continue Rena-vit po QHS.  NUTRITION DIAGNOSIS:   Increased nutrient needs related to chronic illness (cirrhosis, CHF) as evidenced by estimated needs.  Ongoing.  GOAL:   Patient will meet greater than or equal to 90% of their needs  Met with calories, still progressing with protein.  MONITOR:   PO intake,Supplement acceptance,Labs,Weight trends,Skin,I & O's  REASON FOR ASSESSMENT:   LOS    ASSESSMENT:   64 y.o. 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  Met with patient at bedside. She reports her appetite remains good and she is eating well at meals. She is eating 80-100% of her meals. In the past 24 hours patient has had approximately 2165 kcal (100% estimated kcal needs) but only 69 grams of protein (73% minimum estimated protein needs).. Once again discussed patient's increased nutrient needs. Patient continues to decline oral nutrition supplements and other interventions offered by RD. Reviewed sources of protein on menu. RD offered to provide high-protein diet education for patient but she refuses. Patient reports no matter how poor her appetite becomes and how little she eats she does not want any nutrition interventions. However at this time she is eating fairly well and just needs more protein.  Medications reviewed and include: Epogen 20000 units weekly, Rena-vit QHS, Protonix, Miralax, spironolactone.  Labs reviewed.  I/O: 5 occurrences unmeasured UOP yesterday  Weight trend: 95.3 kg on 12/28; wts continue to fluctuate with fluid  Diet Order:   Diet Order            Diet Heart  Room service appropriate? Yes; Fluid consistency: Thin; Fluid restriction: 1500 mL Fluid  Diet effective now                EDUCATION NEEDS:   Education needs have been addressed  Skin:  Skin Assessment: Skin Integrity Issues: Skin Integrity Issues:: Incisions Incisions: closed incision lower abdomen  Last BM:  01/05/2020 - small type 3  Height:   Ht Readings from Last 1 Encounters:  11/13/19 $RemoveB'5\' 6"'HbOgWFjB$  (1.676 m)   Weight:   Wt Readings from Last 1 Encounters:  01/05/20 95.3 kg   Ideal Body Weight:  59 kg  BMI:  Body mass index is 33.89 kg/m.  Estimated Nutritional Needs:   Kcal:  1900-2200kcal/day  Protein:  95-110g/day  Fluid:  1.8-2L/day  Jacklynn Barnacle, MS, RD, LDN Pager number available on Amion

## 2020-01-06 ENCOUNTER — Inpatient Hospital Stay: Payer: Medicaid Other

## 2020-01-06 DIAGNOSIS — K652 Spontaneous bacterial peritonitis: Secondary | ICD-10-CM | POA: Diagnosis not present

## 2020-01-06 DIAGNOSIS — N179 Acute kidney failure, unspecified: Secondary | ICD-10-CM | POA: Diagnosis not present

## 2020-01-06 DIAGNOSIS — K7031 Alcoholic cirrhosis of liver with ascites: Secondary | ICD-10-CM | POA: Diagnosis not present

## 2020-01-06 LAB — CBC WITH DIFFERENTIAL/PLATELET
Abs Immature Granulocytes: 0.02 10*3/uL (ref 0.00–0.07)
Basophils Absolute: 0.1 10*3/uL (ref 0.0–0.1)
Basophils Relative: 1 %
Eosinophils Absolute: 0.2 10*3/uL (ref 0.0–0.5)
Eosinophils Relative: 4 %
HCT: 32.5 % — ABNORMAL LOW (ref 36.0–46.0)
Hemoglobin: 9.8 g/dL — ABNORMAL LOW (ref 12.0–15.0)
Immature Granulocytes: 0 %
Lymphocytes Relative: 26 %
Lymphs Abs: 1.3 10*3/uL (ref 0.7–4.0)
MCH: 22.2 pg — ABNORMAL LOW (ref 26.0–34.0)
MCHC: 30.2 g/dL (ref 30.0–36.0)
MCV: 73.7 fL — ABNORMAL LOW (ref 80.0–100.0)
Monocytes Absolute: 0.7 10*3/uL (ref 0.1–1.0)
Monocytes Relative: 13 %
Neutro Abs: 2.7 10*3/uL (ref 1.7–7.7)
Neutrophils Relative %: 56 %
Platelets: 381 10*3/uL (ref 150–400)
RBC: 4.41 MIL/uL (ref 3.87–5.11)
RDW: 18.6 % — ABNORMAL HIGH (ref 11.5–15.5)
WBC: 5 10*3/uL (ref 4.0–10.5)
nRBC: 0 % (ref 0.0–0.2)

## 2020-01-06 LAB — BASIC METABOLIC PANEL
Anion gap: 10 (ref 5–15)
BUN: 53 mg/dL — ABNORMAL HIGH (ref 8–23)
CO2: 24 mmol/L (ref 22–32)
Calcium: 9.6 mg/dL (ref 8.9–10.3)
Chloride: 100 mmol/L (ref 98–111)
Creatinine, Ser: 3.44 mg/dL — ABNORMAL HIGH (ref 0.44–1.00)
GFR, Estimated: 14 mL/min — ABNORMAL LOW (ref >60)
Glucose, Bld: 86 mg/dL (ref 70–99)
Potassium: 4 mmol/L (ref 3.5–5.1)
Sodium: 134 mmol/L — ABNORMAL LOW (ref 135–145)

## 2020-01-06 LAB — MAGNESIUM: Magnesium: 2.2 mg/dL (ref 1.7–2.4)

## 2020-01-06 NOTE — TOC Progression Note (Addendum)
Transition of Care [facility]) - Progression Note    Patient Details  Name: Katelyn Lane MRN: [id number] Date of Birth: 1955-11-02  Transition of Care [facility]) CM/SW [place], RN Phone Number: 01/06/2020, 1:27 PM  Clinical Narrative:    Damaris Schooner to Kelli Churn of [facility] regarding potential placement. Reviewing clinical and will schedule time to meet with patient.   Spoke with sister, [name] who confirmed patient does not get along with the family and their mother's home is not an option. [name] states she does think patients car is still sitting at mother's home but they are not aware if it is still running. No family member willing to assist with care in any way due to personality differences in the past and patients addiction problems. [name] suggested patient needs to find some income based apartments.  2:06pm: Mr. Jacqualin Combes is planning to come see patient tomorrow between 8:30-9am Thursday. [place] requires COVID vaccine and PPD/Chest xray as well prior to admission to [employer]         Expected Discharge Plan and Services                                                 Social Determinants of Health (SDOH) Interventions    Readmission Risk Interventions Readmission Risk Prevention Plan 09/02/2019 08/14/2019 07/27/2019  Transportation Screening Complete Complete Complete  Medication Review Press photographer) Complete Referral to Pharmacy -  PCP or Specialist appointment within 3-5 days of discharge Complete Complete Complete  HRI or Home Care Consult - Complete Complete  SW Recovery Care/Counseling Consult - Complete Complete  Palliative Care Screening Not Applicable Not Applicable Not Applicable  Skilled Nursing Facility Complete Not Applicable Not Applicable  Some recent data might be hidden

## 2020-01-06 NOTE — Progress Notes (Signed)
PROGRESS NOTE    Katelyn Lane  XBJ:[id number] DOB: 04-19-1955 DOA: 10/19/2019 PCP: [name], MD    Assessment & Plan:   Principal Problem:   Spontaneous bacterial peritonitis ([name]) Active Problems:   AKI (acute kidney injury) ([name])   Crack cocaine use   Acute respiratory failure with hypoxia (HCC)   LV (left ventricular) mural thrombus   Chronic kidney disease (CKD) stage G3b/A1, moderately decreased glomerular filtration rate (GFR) between 30-44 mL/min/1.73 square meter and albuminuria creatinine ratio less than 30 mg/g (HCC)   Nonsustained ventricular tachycardia (HCC)   Acute CHF (congestive heart failure) (HCC)   Cocaine abuse ([name])   Peripheral edema   Cirrhosis of liver (HCC)   Dementia (HCC)   Aspiration pneumonia of both lower lobes due to gastric secretions (HCC)   Acute diastolic (congestive) heart failure ([name])   Cardiorenal disease   Goals of care, counseling/discussion   Palliative care by specialist   Ascites due to alcoholic cirrhosis ([name])   Alcoholic liver cirrhosis:  with ascites.  Spontaneous bacterial peritonitis. Continue on prophylactic Cipro. May need repeat paracentesis   Acute on chronic combined CHF: continue on bumex, spironolactone. Monitor I/Os  Acute hypoxemic respiratory failure: much improved. Continue on nocturnal oxygen intermittently.  AKI on CKDIIIb: Cr is labile. Avoid nephrotoxic meds   Left ventricular thrombus: continue on eliquis   DVT prophylaxis: eliquis Code Status: DNR Family Communication: Disposition Plan: guardianship is pending    Status is: Inpatient  Remains inpatient appropriate because:Unsafe d/c plan, guardianship is pending still    Dispo: The patient is from: Home              Anticipated d/c is to: SNF              Anticipated d/c date is: > 3 days              Patient currently is medically stable to d/c.        Consultants:      Procedures:    Antimicrobials:  cipro   Subjective: Pt c/o fatigue   Objective: Vitals:   01/05/20 1147 01/05/20 1548 01/05/20 2008 01/05/20 2308  BP: 103/84 118/88 105/86 109/83  Pulse: 74 75 70 76  Resp: 20 20 16 16   Temp:  98.7 F (37.1 C) 98.4 F (36.9 C) (!) 97.5 F (36.4 C)  TempSrc:      SpO2: 100% 100% 100% 99%  Weight:      Height:        Intake/Output Summary (Last 24 hours) at 01/06/2020 0745 Last data filed at 01/05/2020 1347 Gross per 24 hour  Intake 240 ml  Output --  Net 240 ml   Filed Weights   01/02/20 0500 01/03/20 0451 01/05/20 0344  Weight: 93.8 kg 96.7 kg 95.3 kg    Examination:  General exam: Appears calm and comfortable  Respiratory system: Clear to auscultation. Respiratory effort normal. Cardiovascular system: S1 & S2 +. No rubs, gallops or clicks. Gastrointestinal system: Abdomen is obese, soft and nontender.  Normal bowel sounds heard. Central nervous system: Alert and oriented. Moves all 4 extremities  Psychiatry: Judgement and insight appear abnormal. Flat mood and affect    Data Reviewed: I have personally reviewed following labs and imaging studies  CBC: Recent Labs  Lab 01/01/20 0544 01/05/20 0653  WBC 4.5 4.8  HGB 10.3* 9.8*  HCT 34.8* 32.7*  MCV 75.8* 74.3*  PLT 360 308   Basic Metabolic Panel: Recent Labs  Lab 01/01/20  0544  CREATININE 3.42*   GFR: Estimated Creatinine Clearance: 19.3 mL/min (A) (by C-G formula based on SCr of 3.42 mg/dL (H)). Liver Function Tests: No results for input(s): AST, ALT, ALKPHOS, BILITOT, PROT, ALBUMIN in the last 168 hours. No results for input(s): LIPASE, AMYLASE in the last 168 hours. No results for input(s): AMMONIA in the last 168 hours. Coagulation Profile: No results for input(s): INR, PROTIME in the last 168 hours. Cardiac Enzymes: No results for input(s): CKTOTAL, CKMB, CKMBINDEX, TROPONINI in the last 168 hours. BNP (last 3 results) No results for input(s): PROBNP in the last 8760 hours. HbA1C: No  results for input(s): HGBA1C in the last 72 hours. CBG: No results for input(s): GLUCAP in the last 168 hours. Lipid Profile: No results for input(s): CHOL, HDL, LDLCALC, TRIG, CHOLHDL, LDLDIRECT in the last 72 hours. Thyroid Function Tests: No results for input(s): TSH, T4TOTAL, FREET4, T3FREE, THYROIDAB in the last 72 hours. Anemia Panel: No results for input(s): VITAMINB12, FOLATE, FERRITIN, TIBC, IRON, RETICCTPCT in the last 72 hours. Sepsis Labs: No results for input(s): PROCALCITON, LATICACIDVEN in the last 168 hours.  No results found for this or any previous visit (from the past 240 hour(s)).       Radiology Studies: No results found.      Scheduled Meds: . apixaban  5 mg Oral BID  . bumetanide  2 mg Oral BID  . ciprofloxacin  500 mg Oral Q breakfast  . epoetin (EPOGEN/PROCRIT) injection  20,000 Units Subcutaneous Weekly  . iron polysaccharides  150 mg Oral Daily  . ketotifen  1 drop Both Eyes BID  . mouth rinse  15 mL Mouth Rinse BID  . melatonin  2.5 mg Oral QHS  . midodrine  10 mg Oral TID WC  . multivitamin  1 tablet Oral QHS  . pantoprazole  40 mg Oral BID  . polyethylene glycol  17 g Oral Daily  . spironolactone  25 mg Oral Daily   Continuous Infusions:   LOS: 79 days    Time spent: 25 mins     [name], MD [facility] Pager [phone]  If 7PM-7AM, please contact night-coverage 01/06/2020, 7:45 AM

## 2020-01-07 DIAGNOSIS — K7031 Alcoholic cirrhosis of liver with ascites: Secondary | ICD-10-CM | POA: Diagnosis not present

## 2020-01-07 DIAGNOSIS — K652 Spontaneous bacterial peritonitis: Secondary | ICD-10-CM | POA: Diagnosis not present

## 2020-01-07 DIAGNOSIS — N179 Acute kidney failure, unspecified: Secondary | ICD-10-CM | POA: Diagnosis not present

## 2020-01-07 LAB — CBC
HCT: 31.6 % — ABNORMAL LOW (ref 36.0–46.0)
Hemoglobin: 9.5 g/dL — ABNORMAL LOW (ref 12.0–15.0)
MCH: 22.4 pg — ABNORMAL LOW (ref 26.0–34.0)
MCHC: 30.1 g/dL (ref 30.0–36.0)
MCV: 74.4 fL — ABNORMAL LOW (ref 80.0–100.0)
Platelets: 353 10*3/uL (ref 150–400)
RBC: 4.25 MIL/uL (ref 3.87–5.11)
RDW: 18.5 % — ABNORMAL HIGH (ref 11.5–15.5)
WBC: 4.5 10*3/uL (ref 4.0–10.5)
nRBC: 0 % (ref 0.0–0.2)

## 2020-01-07 LAB — BASIC METABOLIC PANEL
Anion gap: 10 (ref 5–15)
BUN: 55 mg/dL — ABNORMAL HIGH (ref 8–23)
CO2: 23 mmol/L (ref 22–32)
Calcium: 9.1 mg/dL (ref 8.9–10.3)
Chloride: 100 mmol/L (ref 98–111)
Creatinine, Ser: 3.28 mg/dL — ABNORMAL HIGH (ref 0.44–1.00)
GFR, Estimated: 15 mL/min — ABNORMAL LOW (ref >60)
Glucose, Bld: 87 mg/dL (ref 70–99)
Potassium: 4.3 mmol/L (ref 3.5–5.1)
Sodium: 133 mmol/L — ABNORMAL LOW (ref 135–145)

## 2020-01-07 NOTE — Progress Notes (Signed)
PROGRESS NOTE    Katelyn Lane  VOZ:[id number] DOB: 21-Jul-1955 DOA: 10/19/2019 PCP: [name], MD    Assessment & Plan:   Principal Problem:   Spontaneous bacterial peritonitis ([place]) Active Problems:   AKI (acute kidney injury) ([place])   Crack cocaine use   Acute respiratory failure with hypoxia (HCC)   LV (left ventricular) mural thrombus   Chronic kidney disease (CKD) stage G3b/A1, moderately decreased glomerular filtration rate (GFR) between 30-44 mL/min/1.73 square meter and albuminuria creatinine ratio less than 30 mg/g (HCC)   Nonsustained ventricular tachycardia (HCC)   Acute CHF (congestive heart failure) (HCC)   Cocaine abuse ([place])   Peripheral edema   Cirrhosis of liver (HCC)   Dementia (HCC)   Aspiration pneumonia of both lower lobes due to gastric secretions (HCC)   Acute diastolic (congestive) heart failure ([place])   Cardiorenal disease   Goals of care, counseling/discussion   Palliative care by specialist   Ascites due to alcoholic cirrhosis ([place])   Alcoholic liver cirrhosis:  with ascites.  Spontaneous bacterial peritonitis. Continue on prophylactic Cipro. May need repeat paracentesis   Acute on chronic combined CHF: continue on bumex, spironolactone. Monitor I/Os.  Acute hypoxemic respiratory failure: much improved. Continue on nocturnal oxygen intermittently.  AKI on CKDIIIb: Cr is labile. Will continue to monitor   Left ventricular thrombus: continue on eliquis    DVT prophylaxis: eliquis Code Status: DNR Family Communication: Disposition Plan: guardianship is pending. Unclear d/c plan as pt has refused a group home   Status is: Inpatient  Remains inpatient appropriate because:Unsafe d/c plan, guardianship is pending still    Dispo: The patient is from: Home              Anticipated d/c is to: SNF              Anticipated d/c date is: > 3 days              Patient currently is medically stable to d/c.        Consultants:       Procedures:    Antimicrobials: cipro   Subjective: Pt c/o malaise  Objective: Vitals:   01/06/20 1613 01/06/20 2048 01/06/20 2349 01/07/20 0514  BP: 109/83 112/87 (!) 109/95 107/78  Pulse: 70 69 80 71  Resp: 16 18 16    Temp: 98 F (36.7 C) 98.1 F (36.7 C) 97.8 F (36.6 C) 97.7 F (36.5 C)  TempSrc: Oral     SpO2: 100% 100% 100% 100%  Weight:      Height:       No intake or output data in the 24 hours ending 01/07/20 0734 Filed Weights   01/02/20 0500 01/03/20 0451 01/05/20 0344  Weight: 93.8 kg 96.7 kg 95.3 kg    Examination:  General exam: Appears calm and comfortable  Respiratory system: decreased breath sounds b/l otherwise clear Cardiovascular system: S1 & S2+. No rubs or gallops. Gastrointestinal system: Abd is obese, nontender, & hypoactive bowel sounds  Central nervous system: Alert and oriented. Moves all 4 extremities  Psychiatry: Judgement and insight appear abnormal. Flat mood and affect    Data Reviewed: I have personally reviewed following labs and imaging studies  CBC: Recent Labs  Lab 01/01/20 0544 01/05/20 0653 01/06/20 0729 01/07/20 0428  WBC 4.5 4.8 5.0 4.5  NEUTROABS  --   --  2.7  --   HGB 10.3* 9.8* 9.8* 9.5*  HCT 34.8* 32.7* 32.5* 31.6*  MCV 75.8* 74.3* 73.7* 74.4*  PLT 360 361 381 003   Basic Metabolic Panel: Recent Labs  Lab 01/01/20 0544 01/06/20 0729 01/07/20 0428  NA  --  134* 133*  K  --  4.0 4.3  CL  --  100 100  CO2  --  24 23  GLUCOSE  --  86 87  BUN  --  53* 55*  CREATININE 3.42* 3.44* 3.28*  CALCIUM  --  9.6 9.1  MG  --  2.2  --    GFR: Estimated Creatinine Clearance: 20.2 mL/min (A) (by C-G formula based on SCr of 3.28 mg/dL (H)). Liver Function Tests: No results for input(s): AST, ALT, ALKPHOS, BILITOT, PROT, ALBUMIN in the last 168 hours. No results for input(s): LIPASE, AMYLASE in the last 168 hours. No results for input(s): AMMONIA in the last 168 hours. Coagulation Profile: No results  for input(s): INR, PROTIME in the last 168 hours. Cardiac Enzymes: No results for input(s): CKTOTAL, CKMB, CKMBINDEX, TROPONINI in the last 168 hours. BNP (last 3 results) No results for input(s): PROBNP in the last 8760 hours. HbA1C: No results for input(s): HGBA1C in the last 72 hours. CBG: No results for input(s): GLUCAP in the last 168 hours. Lipid Profile: No results for input(s): CHOL, HDL, LDLCALC, TRIG, CHOLHDL, LDLDIRECT in the last 72 hours. Thyroid Function Tests: No results for input(s): TSH, T4TOTAL, FREET4, T3FREE, THYROIDAB in the last 72 hours. Anemia Panel: No results for input(s): VITAMINB12, FOLATE, FERRITIN, TIBC, IRON, RETICCTPCT in the last 72 hours. Sepsis Labs: No results for input(s): PROCALCITON, LATICACIDVEN in the last 168 hours.  No results found for this or any previous visit (from the past 240 hour(s)).       Radiology Studies: DG Chest Port 1 View  Result Date: 01/06/2020 CLINICAL DATA:  Shortness of breath EXAM: PORTABLE CHEST 1 VIEW COMPARISON:  11/15/2019 FINDINGS: Trace left pleural effusion. Left basilar atelectasis. Mild bilateral interstitial thickening. No pneumothorax. Stable cardiomegaly. No acute osseous abnormality. IMPRESSION: Cardiomegaly with pulmonary vascular congestion. Electronically Signed   By: Kathreen Devoid   On: 01/06/2020 15:06        Scheduled Meds: . apixaban  5 mg Oral BID  . bumetanide  2 mg Oral BID  . ciprofloxacin  500 mg Oral Q breakfast  . epoetin (EPOGEN/PROCRIT) injection  20,000 Units Subcutaneous Weekly  . iron polysaccharides  150 mg Oral Daily  . ketotifen  1 drop Both Eyes BID  . mouth rinse  15 mL Mouth Rinse BID  . melatonin  2.5 mg Oral QHS  . midodrine  10 mg Oral TID WC  . multivitamin  1 tablet Oral QHS  . pantoprazole  40 mg Oral BID  . polyethylene glycol  17 g Oral Daily  . spironolactone  25 mg Oral Daily   Continuous Infusions:   LOS: 80 days    Time spent: 20 mins     [name], MD [facility] Pager [phone]  If 7PM-7AM, please contact night-coverage 01/07/2020, 7:34 AM

## 2020-01-07 NOTE — TOC Progression Note (Signed)
Transition of Care [facility]) - Progression Note    Patient Details  Name: Valetta Mulroy MRN: [id number] Date of Birth: 12/22/1955  [facility]) CM/SW [place], RN Phone Number: 01/07/2020, 10:19 AM  Clinical Narrative:    Patient met with Kelli Churn from group home and has requested some time to think about discharging to group home. Patient did agree to consider COVID vaccine. Discussed case at length with  Medical director, Dr. Ree Kida regarding patient continued refusal of assistance and safe discharge plans as related to previous concerns regarding patients mental capacity.         Expected Discharge Plan and Services                                                 Social Determinants of Health (SDOH) Interventions    Readmission Risk Interventions Readmission Risk Prevention Plan 09/02/2019 08/14/2019 07/27/2019  Transportation Screening Complete Complete Complete  Medication Review [employer] photographer) Complete Referral to Pharmacy -  PCP or Specialist appointment within 3-5 days of discharge Complete Complete Complete  HRI or Home Care Consult - Complete Complete  SW Recovery Care/Counseling Consult - Complete Complete  Palliative Care Screening Not Applicable Not Applicable Not Applicable  Skilled Nursing Facility Complete Not Applicable Not Applicable  Some recent data might be hidden

## 2020-01-08 LAB — CBC
HCT: 31.8 % — ABNORMAL LOW (ref 36.0–46.0)
Hemoglobin: 9.6 g/dL — ABNORMAL LOW (ref 12.0–15.0)
MCH: 22.4 pg — ABNORMAL LOW (ref 26.0–34.0)
MCHC: 30.2 g/dL (ref 30.0–36.0)
MCV: 74.1 fL — ABNORMAL LOW (ref 80.0–100.0)
Platelets: 356 10*3/uL (ref 150–400)
RBC: 4.29 MIL/uL (ref 3.87–5.11)
RDW: 18.6 % — ABNORMAL HIGH (ref 11.5–15.5)
WBC: 4.6 10*3/uL (ref 4.0–10.5)
nRBC: 0 % (ref 0.0–0.2)

## 2020-01-08 LAB — BASIC METABOLIC PANEL
Anion gap: 10 (ref 5–15)
BUN: 59 mg/dL — ABNORMAL HIGH (ref 8–23)
CO2: 23 mmol/L (ref 22–32)
Calcium: 9.1 mg/dL (ref 8.9–10.3)
Chloride: 102 mmol/L (ref 98–111)
Creatinine, Ser: 3.29 mg/dL — ABNORMAL HIGH (ref 0.44–1.00)
GFR, Estimated: 15 mL/min — ABNORMAL LOW (ref >60)
Glucose, Bld: 88 mg/dL (ref 70–99)
Potassium: 4.4 mmol/L (ref 3.5–5.1)
Sodium: 135 mmol/L (ref 135–145)

## 2020-01-08 MED ORDER — GUAIFENESIN ER 600 MG PO TB12
600.0000 mg | ORAL_TABLET | Freq: Three times a day (TID) | ORAL | Status: DC
Start: 2020-01-08 — End: 2020-01-29
  Administered 2020-01-08 – 2020-01-29 (×62): 600 mg via ORAL
  Filled 2020-01-08 (×63): qty 1

## 2020-01-08 MED ORDER — APIXABAN 5 MG PO TABS: 5.0000 mg | ORAL_TABLET | Freq: Two times a day (BID) | ORAL | 0 refills | Status: DC

## 2020-01-08 MED ORDER — CIPROFLOXACIN HCL 500 MG PO TABS: 500.0000 mg | ORAL_TABLET | Freq: Every day | ORAL | 0 refills | Status: DC

## 2020-01-08 MED ORDER — GUAIFENESIN ER 600 MG PO TB12
600.0000 mg | ORAL_TABLET | Freq: Two times a day (BID) | ORAL | Status: DC
  Administered 2020-01-08: 600 mg via ORAL
  Filled 2020-01-08 (×2): qty 1

## 2020-01-08 MED ORDER — ENTRESTO 49-51 MG PO TABS: 1.0000 | ORAL_TABLET | Freq: Two times a day (BID) | ORAL | 0 refills | Status: DC

## 2020-01-08 NOTE — Discharge Summary (Signed)
Physician Discharge Summary  Katelyn Lane VZC:[id number] DOB: 1955/01/31 DOA: 10/19/2019  PCP: [name], MD  Admit date: 10/19/2019 Discharge date: 01/08/2020  Admitted From: home Disposition:  Home w/ family care  Recommendations for Outpatient Follow-up:  1. Follow up with PCP in 1 week 2. F/u cardio in 1-2 weeks 3. F/u GI in 1 week   Home Health: no  Equipment/Devices:  Discharge Condition: stable  CODE STATUS: DNR Diet recommendation: Heart Healthy   Brief/Interim Summary: HPI was taken from Dr. Flossie Buffy: Katelyn Lane is a 64 y.o. 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  She says that for the past 3 weeks she has noticed increasing lower extremity edema with weeping.  Also has been having shortness of breath but does not know whether it is during exertion or at rest.  Denies any chest pain. Did not really want to provide much other history. She is currently homeless and has not been able to afford her medication for the past month.  She is a tobacco user and used cocaine 3 days ago.  No alcohol use.   ED Course: Febrile, normotensive and placed on 3 L via nasal cannula following hypoxia down to 89% on room air. BNP of greater than 4500. Elevated troponin up to 156.EKG on my review shows sinus rhythm with low voltage, left axis deviation and nonspecific T wave changes. Acute on chronic kidney injury with creatinine up to 3.07 from a baseline of around 1.5-1.6.  From Dr. 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 After admission to the hospital, she was diagnosed with acute on chronic combinedsystolic and diastolic congestive heart failurewith ejection fraction 25 to 30%. She was not responding to IV Lasix. She was seen by nephrology, temporary dialysis catheter was performed and she was dialyzed on 10/17 and 10/18.Patient also complaining of abdominal pain, she had a paracentesis, study showed possible spontaneous peritonitis, she was treated with Rocephin and Flagyl. Patient had a worsening respiratory status on 10/17, was diagnosed with aspiration pneumonia. She was treated with Rocephin, Zithromax and Flagyl.Patient also had abdominal pain and a CT scan suspect ischemia, but seen by general surgery on 10/19, no need for surgery. Patient was evaluated by psychiatry and she does not havedecision-making capacity. Family does not want to participate in making decisions for her. DSS case was filed for guardianship and still pending. Difficult disposition due to unsafe discharge. Nephrology team is also following. She was started on IV Lasix and spironolactone for volume overload. Currently on Bumex and spironolactone. Pending guardianship  From Dr. Jimmye Norman 12/29-12/31/21: Pt has been stable and still waiting on pending guardianship. Pt refused to go to a group home. Pt was d/c under family care that live in the [facility] area. Pt will need to f/u w/ PCP, cardio & GI. For more information, please see previous progress/consult notes.   Discharge Diagnoses:  Principal Problem:   Spontaneous bacterial peritonitis ([place]) Active Problems:   AKI (acute kidney injury) ([place])   Crack cocaine use   Acute respiratory failure with  hypoxia (HCC)   LV (left ventricular) mural thrombus   Chronic kidney disease (CKD) stage G3b/A1, moderately decreased glomerular filtration rate  (GFR) between 30-44 mL/min/1.73 square meter and albuminuria creatinine ratio less than 30 mg/g (HCC)   Nonsustained ventricular tachycardia (HCC)   Acute CHF (congestive heart failure) (HCC)   Cocaine abuse ([place])   Peripheral edema   Cirrhosis of liver (HCC)   Dementia (HCC)   Aspiration pneumonia of both lower lobes due to gastric secretions (HCC)   Acute diastolic (congestive) heart failure ([place])   Cardiorenal disease   Goals of care, counseling/discussion   Palliative care by specialist   Ascites due to alcoholic cirrhosis ([place])  Alcoholic liver cirrhosis:  with ascites.  Spontaneous bacterial peritonitis. Continue on prophylactic cipro. May need repeat paracentesis   Acute on chronic combined CHF: continue on bumex, spironolactone. Monitor I/Os.  Acute hypoxemic respiratory failure: much improved. Continue on nocturnal oxygen intermittently.  AKI on CKDIIIb: Cr is labile. Will continue to monitor   Left ventricular thrombus: continue on eliquis   Discharge Instructions  Discharge Instructions    Diet - low sodium heart healthy   Complete by: As directed    Discharge instructions   Complete by: As directed    F/u w/ PCP in 1 week. F/u GI in 1 week. F/u w/ cardio in 1-2 weeks   Increase activity slowly   Complete by: As directed    No wound care   Complete by: As directed      Allergies as of 01/08/2020      Reactions   Penicillins Hives, Rash, Other (See Comments)   Did it involve swelling of the face/tongue/throat, SOB, or low BP? No Did it involve sudden or severe rash/hives, skin peeling, or any reaction on the inside of your mouth or nose? Yes Did you need to seek medical attention at a hospital or doctor's office? Yes When did it last happen? >25 years If all above answers are "NO", may proceed with cephalosporin use.   Ace Inhibitors Nausea And Vomiting      Medication List    STOP taking these medications   aspirin 81 MG chewable tablet    benztropine 0.5 MG tablet Commonly known as: COGENTIN   docusate calcium 240 MG capsule Commonly known as: SURFAK   gabapentin 300 MG capsule Commonly known as: NEURONTIN   rivaroxaban 20 MG Tabs tablet Commonly known as: XARELTO     TAKE these medications   albuterol 108 (90 Base) MCG/ACT inhaler Commonly known as: VENTOLIN HFA Inhale 2 puffs into the lungs every 6 (six) hours as needed for wheezing or shortness of breath.   allopurinol 100 MG tablet Commonly known as: Zyloprim Take 1 tablet (100 mg total) by mouth daily.   apixaban 5 MG Tabs tablet Commonly known as: ELIQUIS Take 1 tablet (5 mg total) by mouth 2 (two) times daily.   ascorbic acid 500 MG tablet Commonly known as: VITAMIN C Take 500 mg by mouth daily.   atorvastatin 20 MG tablet Commonly known as: LIPITOR Take 1 tablet (20 mg total) by mouth daily at 6 PM.   bumetanide 1 MG tablet Commonly known as: BUMEX Take 1 tablet (1 mg total) by mouth 2 (two) times daily. May take additional dose for worsening leg swellings/shortness of breath   carvedilol 6.25 MG tablet Commonly known as: COREG Hold this medication until you see your cardiologist   ciprofloxacin 500 MG tablet Commonly known as: CIPRO Take 1 tablet (500  mg total) by mouth daily with breakfast for 14 days. Start taking on: January 09, 2020   Entresto 49-51 MG Generic drug: sacubitril-valsartan Take 1 tablet by mouth 2 (two) times daily. Hold this medication until you see your cardiologist What changed: additional instructions   ferrous sulfate 325 (65 FE) MG tablet Take 1 tablet (325 mg total) by mouth daily with breakfast.   FLUoxetine 10 MG capsule Commonly known as: PROZAC Take 3 capsules (30 mg total) by mouth daily.   Ipratropium-Albuterol 20-100 MCG/ACT Aers respimat Commonly known as: COMBIVENT Inhale 1 puff into the lungs every 6 (six) hours.   loratadine 10 MG tablet Commonly known as: CLARITIN Take 10 mg by mouth daily  as needed for allergies.   midodrine 2.5 MG tablet Commonly known as: PROAMATINE Take 1 tablet (2.5 mg total) by mouth 3 (three) times daily with meals as needed (for MAP <65 and/or SBP <100).   nitroGLYCERIN 0.4 MG SL tablet Commonly known as: NITROSTAT Place 1 tablet (0.4 mg total) under the tongue every 5 (five) minutes as needed for chest pain.   pantoprazole 40 MG tablet Commonly known as: Protonix Take 1 tablet (40 mg total) by mouth daily.   spironolactone 25 MG tablet Commonly known as: ALDACTONE Take 1 tablet (25 mg total) by mouth daily. Hold this medication until you see your cardiologist   thiothixene 1 MG capsule Commonly known as: NAVANE Take 1 capsule (1 mg total) by mouth 2 (two) times daily.            Durable Medical Equipment  (From admission, onward)         Start     Ordered   12/30/19 1402  For home use only DME Walker rolling  Once       Question Answer Comment  Walker: With 5 Inch Wheels   Patient needs a walker to treat with the following condition Weakness      12/30/19 1401          Allergies  Allergen Reactions  . Penicillins Hives, Rash and Other (See Comments)    Did it involve swelling of the face/tongue/throat, SOB, or low BP? No Did it involve sudden or severe rash/hives, skin peeling, or any reaction on the inside of your mouth or nose? Yes Did you need to seek medical attention at a hospital or doctor's office? Yes When did it last happen? >25 years If all above answers are "NO", may proceed with cephalosporin use.   Humberto Leep Inhibitors Nausea And Vomiting    Consultations:  Podiatry   Psychiatry  General surg  GI  Cardio   Vascular surg   Palliative care   Procedures/Studies: US Paracentesis  Result Date: 12/28/2019 INDICATION: History of CHF and chronic kidney disease with alcohol related cirrhosis with recurrent symptomatic ascites. Please perform ultrasound-guided paracentesis for therapeutic  purposes. EXAM: ULTRASOUND-GUIDED PARACENTESIS COMPARISON:  Multiple previous ultrasound-guided paracenteses, most recently on 12/15/2019 yielding 3.9 L of serous fluid. MEDICATIONS: None. COMPLICATIONS: None immediate. TECHNIQUE: Informed written consent was obtained from the patient after a discussion of the risks, benefits and alternatives to treatment. A timeout was performed prior to the initiation of the procedure. Initial ultrasound scanning demonstrates a moderate amount of slightly loculated ascites within the abdomen with dominant collection located within the right mid hemiabdomen. The operative site was prepped and draped in the usual sterile fashion. 1% lidocaine with epinephrine was used for local anesthesia. Under direct ultrasound guidance, an 8 Fr Safe-T-Centesis catheter was  introduced. The paracentesis was performed. The catheter was removed and a dressing was applied. The patient tolerated the procedure well without immediate post procedural complication. FINDINGS: A total of approximately 4.1 liters of serous fluid was removed. IMPRESSION: Successful ultrasound-guided paracentesis yielding 4.1 liters of peritoneal fluid. Electronically Signed   By: Sandi Mariscal M.D.   On: 12/28/2019 07:45   US Paracentesis  Result Date: 12/15/2019 INDICATION: Patient with history of CHF, chronic kidney disease, alcoholic cirrhosis, prior SBP, recurrent loculated ascites. Request received for therapeutic paracentesis up to 4 liters. EXAM: ULTRASOUND GUIDED THERAPEUTIC PARACENTESIS MEDICATIONS: 1% lidocaine to skin and subcutaneous tissue COMPLICATIONS: None immediate. PROCEDURE: Informed written consent was obtained from the patient after a discussion of the risks, benefits and alternatives to treatment. A timeout was performed prior to the initiation of the procedure. Initial ultrasound scanning demonstrates a moderate to large amount of loculated ascites within the right lower abdominal quadrant. The right  lower abdomen was prepped and draped in the usual sterile fashion. 1% lidocaine was used for local anesthesia. Following this, a 6 Fr Safe-T-Centesis catheter was introduced. An ultrasound image was saved for documentation purposes. The paracentesis was performed. The catheter was removed and a dressing was applied. The patient tolerated the procedure well without immediate post procedural complication. FINDINGS: A total of approximately 3.9 liters of yellow fluid was removed. IMPRESSION: Successful ultrasound-guided therapeutic paracentesis yielding 3.9 liters of peritoneal fluid. Read by: Rowe Robert, PA-C Electronically Signed   By: Jacqulynn Cadet M.D.   On: 12/15/2019 13:02   DG Chest Port 1 View  Result Date: 01/06/2020 CLINICAL DATA:  Shortness of breath EXAM: PORTABLE CHEST 1 VIEW COMPARISON:  11/15/2019 FINDINGS: Trace left pleural effusion. Left basilar atelectasis. Mild bilateral interstitial thickening. No pneumothorax. Stable cardiomegaly. No acute osseous abnormality. IMPRESSION: Cardiomegaly with pulmonary vascular congestion. Electronically Signed   By: Kathreen Devoid   On: 01/06/2020 15:06   [name] ASCITES (ABDOMEN LIMITED)  Result Date: 12/17/2019 CLINICAL DATA:  Cirrhosis, ascites and status post paracentesis on 12/15/2019 with removal of 3.9 L of fluid. History of SBP in October. EXAM: LIMITED ABDOMEN ULTRASOUND FOR ASCITES TECHNIQUE: Limited ultrasound survey for ascites was performed in all four abdominal quadrants. COMPARISON:  12/15/2019 FINDINGS: Survey of the peritoneal cavity shows only trace ascites in the peritoneal cavity. A large enough pocket was not present to allow for paracentesis today. IMPRESSION: Small volume ascites by ultrasound. A large enough pocket was not present to allow paracentesis today. Electronically Signed   By: Aletta Edouard M.D.   On: 12/17/2019 16:16   [name] ASCITES (ABDOMEN LIMITED)  Result Date: 12/14/2019 CLINICAL DATA:  Evaluate for ascites in a  patient with history of prior paracentesis. EXAM: LIMITED ABDOMEN ULTRASOUND FOR ASCITES TECHNIQUE: Limited ultrasound survey for ascites was performed in all four abdominal quadrants. COMPARISON:  Abdomen and pelvis CT from October of 2021 FINDINGS: Moderate volume of ascites seen in the abdomen, fluid in all 4 quadrants with moderate amount in midline. Areas of septation are visible. IMPRESSION: Moderate volume ascites largest pocket in the midline with areas of loculation. Electronically Signed   By: Zetta Bills M.D.   On: 12/14/2019 12:34       Subjective: Pt c/o fatigue    Discharge Exam: Vitals:   01/08/20 0337 01/08/20 0820  BP: 114/85 100/72  Pulse: 73 76  Resp: 18 16  Temp: 97.8 F (36.6 C) 98.1 F (36.7 C)  SpO2: 98% 98%   Vitals:   01/07/20 2021 01/07/20  2318 01/08/20 0337 01/08/20 0820  BP: (!) 125/97 (!) 119/91 114/85 100/72  Pulse: 74 78 73 76  Resp: 17 18 18 16   Temp: 97.8 F (36.6 C) 97.7 F (36.5 C) 97.8 F (36.6 C) 98.1 F (36.7 C)  TempSrc:      SpO2: 100% 100% 98% 98%  Weight:      Height:        General: Pt is alert, awake, not in acute distress Cardiovascular: S1/S2 +, no rubs, no gallops Respiratory: CTA bilaterally, no wheezing, no rhonchi Abdominal: Soft, NT, obese, bowel sounds + Extremities: no cyanosis    The results of significant diagnostics from this hospitalization (including imaging, microbiology, ancillary and laboratory) are listed below for reference.     Microbiology: No results found for this or any previous visit (from the past 240 hour(s)).   Labs: BNP (last 3 results) Recent Labs    08/27/19 1551 08/28/19 0940 10/19/19 0935  BNP 3,266.2* 3,245.7* >0,263.7*   Basic Metabolic Panel: Recent Labs  Lab 01/06/20 0729 01/07/20 0428 01/08/20 0535  NA 134* 133* 135  K 4.0 4.3 4.4  CL 100 100 102  CO2 24 23 23   GLUCOSE 86 87 88  BUN 53* 55* 59*  CREATININE 3.44* 3.28* 3.29*  CALCIUM 9.6 9.1 9.1  MG 2.2  --   --     Liver Function Tests: No results for input(s): AST, ALT, ALKPHOS, BILITOT, PROT, ALBUMIN in the last 168 hours. No results for input(s): LIPASE, AMYLASE in the last 168 hours. No results for input(s): AMMONIA in the last 168 hours. CBC: Recent Labs  Lab 01/05/20 0653 01/06/20 0729 01/07/20 0428 01/08/20 0535  WBC 4.8 5.0 4.5 4.6  NEUTROABS  --  2.7  --   --   HGB 9.8* 9.8* 9.5* 9.6*  HCT 32.7* 32.5* 31.6* 31.8*  MCV 74.3* 73.7* 74.4* 74.1*  PLT 361 381 353 356   Cardiac Enzymes: No results for input(s): CKTOTAL, CKMB, CKMBINDEX, TROPONINI in the last 168 hours. BNP: Invalid input(s): POCBNP CBG: No results for input(s): GLUCAP in the last 168 hours. D-Dimer No results for input(s): DDIMER in the last 72 hours. Hgb A1c No results for input(s): HGBA1C in the last 72 hours. Lipid Profile No results for input(s): CHOL, HDL, LDLCALC, TRIG, CHOLHDL, LDLDIRECT in the last 72 hours. Thyroid function studies No results for input(s): TSH, T4TOTAL, T3FREE, THYROIDAB in the last 72 hours.  Invalid input(s): FREET3 Anemia work up No results for input(s): VITAMINB12, FOLATE, FERRITIN, TIBC, IRON, RETICCTPCT in the last 72 hours. Urinalysis    Component Value Date/Time   COLORURINE YELLOW (A) 07/27/2019 0836   APPEARANCEUR CLEAR (A) 07/27/2019 0836   LABSPEC 1.009 07/27/2019 0836   PHURINE 6.0 07/27/2019 0836   GLUCOSEU NEGATIVE 07/27/2019 0836   HGBUR NEGATIVE 07/27/2019 0836   BILIRUBINUR NEGATIVE 07/27/2019 0836   KETONESUR NEGATIVE 07/27/2019 0836   PROTEINUR NEGATIVE 07/27/2019 0836   NITRITE NEGATIVE 07/27/2019 0836   LEUKOCYTESUR NEGATIVE 07/27/2019 0836   Sepsis Labs Invalid input(s): PROCALCITONIN,  WBC,  LACTICIDVEN Microbiology No results found for this or any previous visit (from the past 240 hour(s)).   Time coordinating discharge: Over 30 minutes  SIGNED:   [name], MD  [facility] 01/08/2020, 3:15 PM Pager   If 7PM-7AM, please  contact night-coverage

## 2020-01-08 NOTE — TOC Progression Note (Signed)
Transition of Care [facility]) - Progression Note    Patient Details  Name: Katelyn Lane MRN: [id number] Date of Birth: 04/16/1955  Transition of Care [facility]) CM/SW [place], RN Phone Number: 01/08/2020, 9:14 AM  Clinical Narrative:   RNCM met with patient in room per her request. She reports that she received a phone call from some extended family in [place] that would be willing to come get her today. She reports that this family is on her mother's side and that they "are all medical". Patient reports that all she needs to do is make sure she can be discharged and then she will call them back. Discussed with patient that per [facility] leadership if she has a safe discharge plan she can leave at any time. Patient reports that her family will help her and they will help her get in with physicians in that area. She further reports that she will reach out to them this morning and then let this CM or bedside nurse know.          Expected Discharge Plan and Services                                                 Social Determinants of Health (SDOH) Interventions    Readmission Risk Interventions Readmission Risk Prevention Plan 09/02/2019 08/14/2019 07/27/2019  Transportation Screening Complete Complete Complete  Medication Review [employer]) Complete Referral to Pharmacy -  PCP or Specialist appointment within 3-5 days of discharge Complete Complete Complete  HRI or Home Care Consult - Complete Complete  SW Recovery Care/Counseling Consult - Complete Complete  Palliative Care Screening Not Applicable Not Applicable Not Applicable  Skilled Nursing Facility Complete Not Applicable Not Applicable  Some recent data might be hidden

## 2020-01-08 NOTE — Plan of Care (Signed)
Pt sitting at bedside,NAD noted.Pt anticipating possible dc,was seen by Dr Jimmye Norman at the beginning of shift.Pt ambulating in the room by bedside,denies any pain or discomfort Appetite and fluid intake good.Pt progressing with care plans,no changes in baseline. Will cont to monitor.

## 2020-01-08 NOTE — TOC Transition Note (Addendum)
Transition of Care [facility]) - CM/SW Discharge Note   Patient Details  Name: Aryiana Klinkner MRN: [id number] Date of Birth: Jun 05, 1955  Transition of [facility] [place]) CM/SW Contact:  Shelbie Ammons, RN Phone Number: 01/08/2020, 1:27 PM   Clinical Narrative:   Plan is for patient to discharge around 3pm when family arrives. Patient is provided with contact information for [facility] should she change her mind about possible placement. Patient is also provided with an imcomplete list of providers in [place] although she understands she will need to follow up with her Medicaid worker to actually change her PCP.           Patient Goals and CMS Choice        Discharge Placement                       Discharge Plan and Services                                     Social Determinants of Health (SDOH) Interventions     Readmission Risk Interventions Readmission Risk Prevention Plan 09/02/2019 08/14/2019 07/27/2019  Transportation Screening Complete Complete Complete  Medication Review [employer] photographer) Complete Referral to Pharmacy -  PCP or Specialist appointment within 3-5 days of discharge Complete Complete Complete  HRI or Home Care Consult - Complete Complete  SW Recovery Care/Counseling Consult - Complete Complete  Palliative Care Screening Not Applicable Not Applicable Not Applicable  Skilled Nursing Facility Complete Not Applicable Not Applicable  Some recent data might be hidden

## 2020-01-09 DIAGNOSIS — K7031 Alcoholic cirrhosis of liver with ascites: Secondary | ICD-10-CM | POA: Diagnosis not present

## 2020-01-09 DIAGNOSIS — K652 Spontaneous bacterial peritonitis: Secondary | ICD-10-CM | POA: Diagnosis not present

## 2020-01-09 DIAGNOSIS — N179 Acute kidney failure, unspecified: Secondary | ICD-10-CM | POA: Diagnosis not present

## 2020-01-09 LAB — BASIC METABOLIC PANEL
Anion gap: 11 (ref 5–15)
BUN: 58 mg/dL — ABNORMAL HIGH (ref 8–23)
CO2: 22 mmol/L (ref 22–32)
Calcium: 9.7 mg/dL (ref 8.9–10.3)
Chloride: 101 mmol/L (ref 98–111)
Creatinine, Ser: 3.31 mg/dL — ABNORMAL HIGH (ref 0.44–1.00)
GFR, Estimated: 15 mL/min — ABNORMAL LOW (ref >60)
Glucose, Bld: 123 mg/dL — ABNORMAL HIGH (ref 70–99)
Potassium: 4.2 mmol/L (ref 3.5–5.1)
Sodium: 134 mmol/L — ABNORMAL LOW (ref 135–145)

## 2020-01-09 LAB — CBC
HCT: 32.4 % — ABNORMAL LOW (ref 36.0–46.0)
Hemoglobin: 10.2 g/dL — ABNORMAL LOW (ref 12.0–15.0)
MCH: 22.8 pg — ABNORMAL LOW (ref 26.0–34.0)
MCHC: 31.5 g/dL (ref 30.0–36.0)
MCV: 72.3 fL — ABNORMAL LOW (ref 80.0–100.0)
Platelets: 355 10*3/uL (ref 150–400)
RBC: 4.48 MIL/uL (ref 3.87–5.11)
RDW: 18.3 % — ABNORMAL HIGH (ref 11.5–15.5)
WBC: 4.6 10*3/uL (ref 4.0–10.5)
nRBC: 0 % (ref 0.0–0.2)

## 2020-01-09 NOTE — Plan of Care (Signed)
  Problem: Education: Goal: Knowledge of General Education information will improve Description: Including pain rating scale, medication(s)/side effects and non-pharmacologic comfort measures Outcome: Progressing   Problem: Health Behavior/Discharge Planning: Goal: Ability to manage health-related needs will improve Outcome: Progressing   Problem: Clinical Measurements: Goal: Ability to maintain clinical measurements within normal limits will improve Outcome: Progressing Goal: Will remain free from infection Outcome: Progressing Goal: Diagnostic test results will improve Outcome: Progressing Goal: Respiratory complications will improve Outcome: Progressing Goal: Cardiovascular complication will be avoided Outcome: Progressing   Problem: Nutrition: Goal: Adequate nutrition will be maintained Outcome: Progressing   Problem: Coping: Goal: Level of anxiety will decrease Outcome: Progressing  Pt somewhat emotional at times - she states family was supposed to pick her up today and no one showed up or called.  Emotional support given

## 2020-01-09 NOTE — Progress Notes (Signed)
PROGRESS NOTE    Katelyn Lane  VEL:[id number] DOB: 1955/05/20 DOA: 10/19/2019 PCP: [name], MD    Assessment & Plan:   Principal Problem:   Spontaneous bacterial peritonitis ([place]) Active Problems:   AKI (acute kidney injury) ([place])   Crack cocaine use   Acute respiratory failure with hypoxia (HCC)   LV (left ventricular) mural thrombus   Chronic kidney disease (CKD) stage G3b/A1, moderately decreased glomerular filtration rate (GFR) between 30-44 mL/min/1.73 square meter and albuminuria creatinine ratio less than 30 mg/g (HCC)   Nonsustained ventricular tachycardia (HCC)   Acute CHF (congestive heart failure) (HCC)   Cocaine abuse ([place])   Peripheral edema   Cirrhosis of liver (HCC)   Dementia (HCC)   Aspiration pneumonia of both lower lobes due to gastric secretions (HCC)   Acute diastolic (congestive) heart failure ([place])   Cardiorenal disease   Goals of care, counseling/discussion   Palliative care by specialist   Ascites due to alcoholic cirrhosis ([place])   Alcoholic liver cirrhosis:  with ascites.  Spontaneous bacterial peritonitis. Continue on prophylactic Cipro. May need repeat paracentesis   Acute on chronic combined CHF: continue on bumex, spironolactone. Monitor I/Os.  Acute hypoxemic respiratory failure: much improved. Continue on nocturnal oxygen intermittently.  AKI on CKDIIIb: Cr is labile. Will continue to monitor   Left ventricular thrombus: continue on eliquis    DVT prophylaxis: eliquis Code Status: DNR Family Communication: Disposition Plan: guardianship is pending. Unclear d/c plan as pt has refused a group home. Family was suppose to come pick up pt yesterday but no family showed up and they did not return the pt's calls    Status is: Inpatient  Remains inpatient appropriate because:Unsafe d/c plan, guardianship is pending still    Dispo: The patient is from: Home              Anticipated d/c is to: SNF              Anticipated  d/c date is: > 3 days              Patient currently is medically stable to d/c.        Consultants:      Procedures:    Antimicrobials: cipro   Subjective: Pt c/o fatigue   Objective: Vitals:   01/08/20 1954 01/09/20 0024 01/09/20 0321 01/09/20 0456  BP: (!) 125/97 121/90 114/80   Pulse: 84 92 87   Resp: 19 18 18    Temp: 98.2 F (36.8 C)  98.6 F (37 C)   TempSrc:      SpO2: (!) 78% 95% 99%   Weight:    95.7 kg  Height:       No intake or output data in the 24 hours ending 01/09/20 0723 Filed Weights   01/03/20 0451 01/05/20 0344 01/09/20 0456  Weight: 96.7 kg 95.3 kg 95.7 kg    Examination:  General exam: Appears calm & comfortable  Respiratory system: diminished breath sounds b/l  Cardiovascular system: S1 & S2+. No rubs or gallops. Gastrointestinal system: Abd is soft, obese, NT & normal bowel sounds  Central nervous system: Alert and oriented. Moves all 4 extremities  Psychiatry: Judgement and insight appear abnormal. Flat mood and affect    Data Reviewed: I have personally reviewed following labs and imaging studies  CBC: Recent Labs  Lab 01/05/20 0653 01/06/20 0729 01/07/20 0428 01/08/20 0535 01/09/20 0443  WBC 4.8 5.0 4.5 4.6 4.6  NEUTROABS  --  2.7  --   --   --  HGB 9.8* 9.8* 9.5* 9.6* 10.2*  HCT 32.7* 32.5* 31.6* 31.8* 32.4*  MCV 74.3* 73.7* 74.4* 74.1* 72.3*  PLT 361 381 353 356 979   Basic Metabolic Panel: Recent Labs  Lab 01/06/20 0729 01/07/20 0428 01/08/20 0535 01/09/20 0443  NA 134* 133* 135 134*  K 4.0 4.3 4.4 4.2  CL 100 100 102 101  CO2 24 23 23 22   GLUCOSE 86 87 88 123*  BUN 53* 55* 59* 58*  CREATININE 3.44* 3.28* 3.29* 3.31*  CALCIUM 9.6 9.1 9.1 9.7  MG 2.2  --   --   --    GFR: Estimated Creatinine Clearance: 20 mL/min (A) (by C-G formula based on SCr of 3.31 mg/dL (H)). Liver Function Tests: No results for input(s): AST, ALT, ALKPHOS, BILITOT, PROT, ALBUMIN in the last 168 hours. No results for  input(s): LIPASE, AMYLASE in the last 168 hours. No results for input(s): AMMONIA in the last 168 hours. Coagulation Profile: No results for input(s): INR, PROTIME in the last 168 hours. Cardiac Enzymes: No results for input(s): CKTOTAL, CKMB, CKMBINDEX, TROPONINI in the last 168 hours. BNP (last 3 results) No results for input(s): PROBNP in the last 8760 hours. HbA1C: No results for input(s): HGBA1C in the last 72 hours. CBG: No results for input(s): GLUCAP in the last 168 hours. Lipid Profile: No results for input(s): CHOL, HDL, LDLCALC, TRIG, CHOLHDL, LDLDIRECT in the last 72 hours. Thyroid Function Tests: No results for input(s): TSH, T4TOTAL, FREET4, T3FREE, THYROIDAB in the last 72 hours. Anemia Panel: No results for input(s): VITAMINB12, FOLATE, FERRITIN, TIBC, IRON, RETICCTPCT in the last 72 hours. Sepsis Labs: No results for input(s): PROCALCITON, LATICACIDVEN in the last 168 hours.  No results found for this or any previous visit (from the past 240 hour(s)).       Radiology Studies: No results found.      Scheduled Meds: . apixaban  5 mg Oral BID  . bumetanide  2 mg Oral BID  . ciprofloxacin  500 mg Oral Q breakfast  . epoetin (EPOGEN/PROCRIT) injection  20,000 Units Subcutaneous Weekly  . guaiFENesin  600 mg Oral TID  . iron polysaccharides  150 mg Oral Daily  . ketotifen  1 drop Both Eyes BID  . mouth rinse  15 mL Mouth Rinse BID  . melatonin  2.5 mg Oral QHS  . midodrine  10 mg Oral TID WC  . multivitamin  1 tablet Oral QHS  . pantoprazole  40 mg Oral BID  . polyethylene glycol  17 g Oral Daily  . spironolactone  25 mg Oral Daily   Continuous Infusions:   LOS: 82 days    Time spent: 22 mins     [name], MD [facility] Pager [phone]  If 7PM-7AM, please contact night-coverage 01/09/2020, 7:23 AM

## 2020-01-10 DIAGNOSIS — K7031 Alcoholic cirrhosis of liver with ascites: Secondary | ICD-10-CM | POA: Diagnosis not present

## 2020-01-10 DIAGNOSIS — N179 Acute kidney failure, unspecified: Secondary | ICD-10-CM | POA: Diagnosis not present

## 2020-01-10 DIAGNOSIS — K652 Spontaneous bacterial peritonitis: Secondary | ICD-10-CM | POA: Diagnosis not present

## 2020-01-10 LAB — BASIC METABOLIC PANEL
Anion gap: 12 (ref 5–15)
BUN: 57 mg/dL — ABNORMAL HIGH (ref 8–23)
CO2: 23 mmol/L (ref 22–32)
Calcium: 9.5 mg/dL (ref 8.9–10.3)
Chloride: 100 mmol/L (ref 98–111)
Creatinine, Ser: 3.4 mg/dL — ABNORMAL HIGH (ref 0.44–1.00)
GFR, Estimated: 14 mL/min — ABNORMAL LOW (ref >60)
Glucose, Bld: 77 mg/dL (ref 70–99)
Potassium: 4 mmol/L (ref 3.5–5.1)
Sodium: 135 mmol/L (ref 135–145)

## 2020-01-10 LAB — CBC
HCT: 32.9 % — ABNORMAL LOW (ref 36.0–46.0)
Hemoglobin: 10 g/dL — ABNORMAL LOW (ref 12.0–15.0)
MCH: 21.9 pg — ABNORMAL LOW (ref 26.0–34.0)
MCHC: 30.4 g/dL (ref 30.0–36.0)
MCV: 72 fL — ABNORMAL LOW (ref 80.0–100.0)
Platelets: 371 10*3/uL (ref 150–400)
RBC: 4.57 MIL/uL (ref 3.87–5.11)
RDW: 18.4 % — ABNORMAL HIGH (ref 11.5–15.5)
WBC: 4.4 10*3/uL (ref 4.0–10.5)
nRBC: 0 % (ref 0.0–0.2)

## 2020-01-10 LAB — RESP PANEL BY RT-PCR (FLU A&B, COVID) ARPGX2
Influenza A by PCR: NEGATIVE
Influenza B by PCR: NEGATIVE
SARS Coronavirus 2 by RT PCR: NEGATIVE

## 2020-01-10 MED ORDER — LORATADINE 10 MG PO TABS: 10.0000 mg | ORAL_TABLET | Freq: Every day | ORAL | Status: DC

## 2020-01-10 MED ORDER — LORATADINE 10 MG PO TABS
10.0000 mg | ORAL_TABLET | Freq: Every day | ORAL | Status: DC
  Administered 2020-01-10 – 2020-01-29 (×20): 10 mg via ORAL
  Filled 2020-01-10 (×20): qty 1

## 2020-01-10 MED ORDER — FLUTICASONE PROPIONATE 50 MCG/ACT NA SUSP
1.0000 | Freq: Every day | NASAL | Status: DC
  Administered 2020-01-10: 1 via NASAL
  Filled 2020-01-10: qty 16

## 2020-01-10 MED ORDER — FLUTICASONE PROPIONATE 50 MCG/ACT NA SUSP
2.0000 | Freq: Every day | NASAL | Status: DC
  Administered 2020-01-11 – 2020-01-29 (×18): 2 via NASAL
  Filled 2020-01-10: qty 16

## 2020-01-10 MED ORDER — OXYMETAZOLINE HCL 0.05 % NA SOLN
1.0000 | Freq: Once | NASAL | Status: AC
  Administered 2020-01-10: 1 via NASAL
  Filled 2020-01-10: qty 15

## 2020-01-10 NOTE — Progress Notes (Signed)
PROGRESS NOTE    Katelyn Lane  DVV:[id number] DOB: 1955-06-05 DOA: 10/19/2019 PCP: [name], MD    Assessment & Plan:   Principal Problem:   Spontaneous bacterial peritonitis ([place]) Active Problems:   AKI (acute kidney injury) ([place])   Crack cocaine use   Acute respiratory failure with hypoxia (HCC)   LV (left ventricular) mural thrombus   Chronic kidney disease (CKD) stage G3b/A1, moderately decreased glomerular filtration rate (GFR) between 30-44 mL/min/1.73 square meter and albuminuria creatinine ratio less than 30 mg/g (HCC)   Nonsustained ventricular tachycardia (HCC)   Acute CHF (congestive heart failure) (HCC)   Cocaine abuse ([place])   Peripheral edema   Cirrhosis of liver (HCC)   Dementia (HCC)   Aspiration pneumonia of both lower lobes due to gastric secretions (HCC)   Acute diastolic (congestive) heart failure ([place])   Cardiorenal disease   Goals of care, counseling/discussion   Palliative care by specialist   Ascites due to alcoholic cirrhosis ([place])   Alcoholic liver cirrhosis:  with ascites.  Spontaneous bacterial peritonitis. Continue on prophylactic Cipro. Repeat paracentesis ordered  Acute on chronic combined CHF: continue on bumex, spironolactone. Monitor I/Os  Acute hypoxemic respiratory failure: much improved. Continue on nocturnal oxygen intermittently.  AKI on CKDIIIb: Cr is labile. Avoid nephrotoxic meds   Left ventricular thrombus: continue on eliquis    DVT prophylaxis: eliquis Code Status: DNR Family Communication: Disposition Plan: guardianship is pending. Unclear d/c plan as pt has refused a group home. Family was suppose to come pick up pt 2 days ago but no family showed up and they did not return the pt's calls    Status is: Inpatient  Remains inpatient appropriate because:Unsafe d/c plan, guardianship is pending still    Dispo: The patient is from: Home              Anticipated d/c is to: SNF              Anticipated d/c  date is: > 3 days              Patient currently is medically stable to d/c.        Consultants:      Procedures:    Antimicrobials: cipro   Subjective: Pt c/o increased mucus production   Objective: Vitals:   01/09/20 1612 01/09/20 2118 01/10/20 0052 01/10/20 0524  BP: 115/85 111/89 (!) 119/95 112/81  Pulse: 73 81 79 71  Resp: 19 18 18 20   Temp: 98.2 F (36.8 C) 98.8 F (37.1 C) 97.7 F (36.5 C) 98 F (36.7 C)  TempSrc:    Oral  SpO2: 95% 96% 100% 100%  Weight:      Height:       No intake or output data in the 24 hours ending 01/10/20 0727 Filed Weights   01/03/20 0451 01/05/20 0344 01/09/20 0456  Weight: 96.7 kg 95.3 kg 95.7 kg    Examination:  General exam: Appears calm & comfortable  Respiratory system: decreased breath sounds b/l Cardiovascular system: S1/S2+. No clicks or rubs  Gastrointestinal system: Abd is soft, obese, NT & normal bowel sounds  Central nervous system: Alert and oriented. Moves all 4 extremities  Psychiatry: Judgement and insight appear abnormal. Flat mood and affect    Data Reviewed: I have personally reviewed following labs and imaging studies  CBC: Recent Labs  Lab 01/06/20 0729 01/07/20 0428 01/08/20 0535 01/09/20 0443 01/10/20 0527  WBC 5.0 4.5 4.6 4.6 4.4  NEUTROABS 2.7  --   --   --   --  HGB 9.8* 9.5* 9.6* 10.2* 10.0*  HCT 32.5* 31.6* 31.8* 32.4* 32.9*  MCV 73.7* 74.4* 74.1* 72.3* 72.0*  PLT 381 353 356 355 948   Basic Metabolic Panel: Recent Labs  Lab 01/06/20 0729 01/07/20 0428 01/08/20 0535 01/09/20 0443 01/10/20 0527  NA 134* 133* 135 134* 135  K 4.0 4.3 4.4 4.2 4.0  CL 100 100 102 101 100  CO2 24 23 23 22 23   GLUCOSE 86 87 88 123* 77  BUN 53* 55* 59* 58* 57*  CREATININE 3.44* 3.28* 3.29* 3.31* 3.40*  CALCIUM 9.6 9.1 9.1 9.7 9.5  MG 2.2  --   --   --   --    GFR: Estimated Creatinine Clearance: 19.5 mL/min (A) (by C-G formula based on SCr of 3.4 mg/dL (H)). Liver Function Tests: No  results for input(s): AST, ALT, ALKPHOS, BILITOT, PROT, ALBUMIN in the last 168 hours. No results for input(s): LIPASE, AMYLASE in the last 168 hours. No results for input(s): AMMONIA in the last 168 hours. Coagulation Profile: No results for input(s): INR, PROTIME in the last 168 hours. Cardiac Enzymes: No results for input(s): CKTOTAL, CKMB, CKMBINDEX, TROPONINI in the last 168 hours. BNP (last 3 results) No results for input(s): PROBNP in the last 8760 hours. HbA1C: No results for input(s): HGBA1C in the last 72 hours. CBG: No results for input(s): GLUCAP in the last 168 hours. Lipid Profile: No results for input(s): CHOL, HDL, LDLCALC, TRIG, CHOLHDL, LDLDIRECT in the last 72 hours. Thyroid Function Tests: No results for input(s): TSH, T4TOTAL, FREET4, T3FREE, THYROIDAB in the last 72 hours. Anemia Panel: No results for input(s): VITAMINB12, FOLATE, FERRITIN, TIBC, IRON, RETICCTPCT in the last 72 hours. Sepsis Labs: No results for input(s): PROCALCITON, LATICACIDVEN in the last 168 hours.  No results found for this or any previous visit (from the past 240 hour(s)).       Radiology Studies: No results found.      Scheduled Meds: . apixaban  5 mg Oral BID  . bumetanide  2 mg Oral BID  . ciprofloxacin  500 mg Oral Q breakfast  . epoetin (EPOGEN/PROCRIT) injection  20,000 Units Subcutaneous Weekly  . guaiFENesin  600 mg Oral TID  . iron polysaccharides  150 mg Oral Daily  . ketotifen  1 drop Both Eyes BID  . mouth rinse  15 mL Mouth Rinse BID  . melatonin  2.5 mg Oral QHS  . midodrine  10 mg Oral TID WC  . multivitamin  1 tablet Oral QHS  . pantoprazole  40 mg Oral BID  . polyethylene glycol  17 g Oral Daily  . spironolactone  25 mg Oral Daily   Continuous Infusions:   LOS: 83 days    Time spent: 25 mins     [name], MD [facility] Pager [phone]  If 7PM-7AM, please contact night-coverage 01/10/2020, 7:27 AM

## 2020-01-11 ENCOUNTER — Inpatient Hospital Stay: Payer: Medicaid Other

## 2020-01-11 DIAGNOSIS — N179 Acute kidney failure, unspecified: Secondary | ICD-10-CM | POA: Diagnosis not present

## 2020-01-11 DIAGNOSIS — K7031 Alcoholic cirrhosis of liver with ascites: Secondary | ICD-10-CM | POA: Diagnosis not present

## 2020-01-11 DIAGNOSIS — K652 Spontaneous bacterial peritonitis: Secondary | ICD-10-CM | POA: Diagnosis not present

## 2020-01-11 LAB — CBC
HCT: 34.7 % — ABNORMAL LOW (ref 36.0–46.0)
Hemoglobin: 10.4 g/dL — ABNORMAL LOW (ref 12.0–15.0)
MCH: 22 pg — ABNORMAL LOW (ref 26.0–34.0)
MCHC: 30 g/dL (ref 30.0–36.0)
MCV: 73.4 fL — ABNORMAL LOW (ref 80.0–100.0)
Platelets: 349 K/uL (ref 150–400)
RBC: 4.73 MIL/uL (ref 3.87–5.11)
RDW: 17.7 % — ABNORMAL HIGH (ref 11.5–15.5)
WBC: 4.4 K/uL (ref 4.0–10.5)
nRBC: 0 % (ref 0.0–0.2)

## 2020-01-11 LAB — BASIC METABOLIC PANEL
Anion gap: 10 (ref 5–15)
BUN: 55 mg/dL — ABNORMAL HIGH (ref 8–23)
CO2: 23 mmol/L (ref 22–32)
Calcium: 9.3 mg/dL (ref 8.9–10.3)
Chloride: 100 mmol/L (ref 98–111)
Creatinine, Ser: 3.29 mg/dL — ABNORMAL HIGH (ref 0.44–1.00)
GFR, Estimated: 15 mL/min — ABNORMAL LOW (ref >60)
Glucose, Bld: 91 mg/dL (ref 70–99)
Potassium: 4 mmol/L (ref 3.5–5.1)
Sodium: 133 mmol/L — ABNORMAL LOW (ref 135–145)

## 2020-01-11 NOTE — Progress Notes (Signed)
PROGRESS NOTE    Katelyn Lane  EHO:[id number] DOB: Dec 03, 1955 DOA: 10/19/2019 PCP: [name], MD    Assessment & Plan:   Principal Problem:   Spontaneous bacterial peritonitis ([place]) Active Problems:   AKI (acute kidney injury) ([place])   Crack cocaine use   Acute respiratory failure with hypoxia (HCC)   LV (left ventricular) mural thrombus   Chronic kidney disease (CKD) stage G3b/A1, moderately decreased glomerular filtration rate (GFR) between 30-44 mL/min/1.73 square meter and albuminuria creatinine ratio less than 30 mg/g (HCC)   Nonsustained ventricular tachycardia (HCC)   Acute CHF (congestive heart failure) (HCC)   Cocaine abuse ([place])   Peripheral edema   Cirrhosis of liver (HCC)   Dementia (HCC)   Aspiration pneumonia of both lower lobes due to gastric secretions (HCC)   Acute diastolic (congestive) heart failure ([place])   Cardiorenal disease   Goals of care, counseling/discussion   Palliative care by specialist   Ascites due to alcoholic cirrhosis ([place])   Alcoholic liver cirrhosis:  with ascites.  Spontaneous bacterial peritonitis. Continue on prophylactic Cipro. Repeat paracentesis today. Will hold eliquis   Acute on chronic combined CHF: continue on spironolactone, bumex. Monitor I/Os.   Acute hypoxemic respiratory failure: much improved. Continue on nocturnal oxygen intermittently.  AKI on CKDIIIb: Cr is labile.   Left ventricular thrombus: hold eliquis today for paracentesis    DVT prophylaxis: eliquis Code Status: DNR Family Communication: Disposition Plan: guardianship is pending. Unclear d/c plan as pt has refused a group home. Family was suppose to come pick up pt 2 days ago but no family showed up and they did not return the pt's calls    Status is: Inpatient  Remains inpatient appropriate because:Unsafe d/c plan, guardianship is pending still    Dispo: The patient is from: Home              Anticipated d/c is to: SNF               Anticipated d/c date is: > 3 days              Patient currently is medically stable to d/c.        Consultants:      Procedures:    Antimicrobials: cipro   Subjective: Pt c/o abd distention    Objective: Vitals:   01/10/20 1547 01/10/20 2105 01/11/20 0035 01/11/20 0410  BP: 96/85 (!) 110/97 (!) 108/94 (!) 121/98  Pulse: 75 80 77 82  Resp: 16   20  Temp: 98.4 F (36.9 C) 97.8 F (36.6 C) 98.1 F (36.7 C) 98.3 F (36.8 C)  TempSrc:      SpO2: 100% 100% 100% 100%  Weight:      Height:       No intake or output data in the 24 hours ending 01/11/20 0738 Filed Weights   01/03/20 0451 01/05/20 0344 01/09/20 0456  Weight: 96.7 kg 95.3 kg 95.7 kg    Examination:  General exam: Appears uncomfortable  Respiratory system: diminished breath sounds b/l Cardiovascular system: S1/S2+. No clicks or rubs  Gastrointestinal system: Abd is soft, distended, NT & hypoactive bowel sounds  Central nervous system: Alert and oriented. Moves all 4 extremities  Psychiatry: Judgement and insight appear abnormal. Flat mood and affect    Data Reviewed: I have personally reviewed following labs and imaging studies  CBC: Recent Labs  Lab 01/06/20 0729 01/07/20 0428 01/08/20 0535 01/09/20 0443 01/10/20 0527 01/11/20 0455  WBC 5.0 4.5 4.6 4.6  4.4 4.4  NEUTROABS 2.7  --   --   --   --   --   HGB 9.8* 9.5* 9.6* 10.2* 10.0* 10.4*  HCT 32.5* 31.6* 31.8* 32.4* 32.9* 34.7*  MCV 73.7* 74.4* 74.1* 72.3* 72.0* 73.4*  PLT 381 353 356 355 371 578   Basic Metabolic Panel: Recent Labs  Lab 01/06/20 0729 01/07/20 0428 01/08/20 0535 01/09/20 0443 01/10/20 0527 01/11/20 0455  NA 134* 133* 135 134* 135 133*  K 4.0 4.3 4.4 4.2 4.0 4.0  CL 100 100 102 101 100 100  CO2 24 23 23 22 23 23   GLUCOSE 86 87 88 123* 77 91  BUN 53* 55* 59* 58* 57* 55*  CREATININE 3.44* 3.28* 3.29* 3.31* 3.40* 3.29*  CALCIUM 9.6 9.1 9.1 9.7 9.5 9.3  MG 2.2  --   --   --   --   --    GFR: Estimated  Creatinine Clearance: 20.2 mL/min (A) (by C-G formula based on SCr of 3.29 mg/dL (H)). Liver Function Tests: No results for input(s): AST, ALT, ALKPHOS, BILITOT, PROT, ALBUMIN in the last 168 hours. No results for input(s): LIPASE, AMYLASE in the last 168 hours. No results for input(s): AMMONIA in the last 168 hours. Coagulation Profile: No results for input(s): INR, PROTIME in the last 168 hours. Cardiac Enzymes: No results for input(s): CKTOTAL, CKMB, CKMBINDEX, TROPONINI in the last 168 hours. BNP (last 3 results) No results for input(s): PROBNP in the last 8760 hours. HbA1C: No results for input(s): HGBA1C in the last 72 hours. CBG: No results for input(s): GLUCAP in the last 168 hours. Lipid Profile: No results for input(s): CHOL, HDL, LDLCALC, TRIG, CHOLHDL, LDLDIRECT in the last 72 hours. Thyroid Function Tests: No results for input(s): TSH, T4TOTAL, FREET4, T3FREE, THYROIDAB in the last 72 hours. Anemia Panel: No results for input(s): VITAMINB12, FOLATE, FERRITIN, TIBC, IRON, RETICCTPCT in the last 72 hours. Sepsis Labs: No results for input(s): PROCALCITON, LATICACIDVEN in the last 168 hours.  Recent Results (from the past 240 hour(s))  Resp Panel by RT-PCR (Flu A&B, Covid) Nasopharyngeal Swab     Status: None   Collection Time: 01/10/20  4:44 PM   Specimen: Nasopharyngeal Swab; Nasopharyngeal(NP) swabs in vial transport medium  Result Value Ref Range Status   SARS Coronavirus 2 by RT PCR NEGATIVE NEGATIVE Final    Comment: (NOTE) SARS-CoV-2 target nucleic acids are NOT DETECTED.  The SARS-CoV-2 RNA is generally detectable in upper respiratory specimens during the acute phase of infection. The lowest concentration of SARS-CoV-2 viral copies this assay can detect is 138 copies/mL. A negative result does not preclude SARS-Cov-2 infection and should not be used as the sole basis for treatment or other patient management decisions. A negative result may occur with   improper specimen collection/handling, submission of specimen other than nasopharyngeal swab, presence of viral mutation(s) within the areas targeted by this assay, and inadequate number of viral copies(<138 copies/mL). A negative result must be combined with clinical observations, patient history, and epidemiological information. The expected result is Negative.  Fact Sheet for Patients:  EntrepreneurPulse.com.au  Fact Sheet for Healthcare Providers:  IncredibleEmployment.be  This test is no t yet approved or cleared by the [place] FDA and  has been authorized for detection and/or diagnosis of SARS-CoV-2 by FDA under an Emergency Use Authorization (EUA). This EUA will remain  in effect (meaning this test can be used) for the duration of the COVID-19 declaration under Section 564(b)(1) of the Act, 21  U.S.C.section 360bbb-3(b)(1), unless the authorization is terminated  or revoked sooner.       Influenza A by PCR NEGATIVE NEGATIVE Final   Influenza B by PCR NEGATIVE NEGATIVE Final    Comment: (NOTE) The Xpert Xpress SARS-CoV-2/FLU/RSV plus assay is intended as an aid in the diagnosis of influenza from Nasopharyngeal swab specimens and should not be used as a sole basis for treatment. Nasal washings and aspirates are unacceptable for Xpert Xpress SARS-CoV-2/FLU/RSV testing.  Fact Sheet for Patients: EntrepreneurPulse.com.au  Fact Sheet for Healthcare Providers: IncredibleEmployment.be  This test is not yet approved or cleared by the [place] FDA and has been authorized for detection and/or diagnosis of SARS-CoV-2 by FDA under an Emergency Use Authorization (EUA). This EUA will remain in effect (meaning this test can be used) for the duration of the COVID-19 declaration under Section 564(b)(1) of the Act, 21 U.S.C. section 360bbb-3(b)(1), unless the authorization is terminated  or revoked.  Performed at [facility], [address] [place], [place] [address]          Radiology Studies: No results found.      Scheduled Meds: . apixaban  5 mg Oral BID  . bumetanide  2 mg Oral BID  . ciprofloxacin  500 mg Oral Q breakfast  . epoetin (EPOGEN/PROCRIT) injection  20,000 Units Subcutaneous Weekly  . fluticasone  2 spray Each Nare Daily  . guaiFENesin  600 mg Oral TID  . iron polysaccharides  150 mg Oral Daily  . ketotifen  1 drop Both Eyes BID  . loratadine  10 mg Oral Daily  . mouth rinse  15 mL Mouth Rinse BID  . melatonin  2.5 mg Oral QHS  . midodrine  10 mg Oral TID WC  . multivitamin  1 tablet Oral QHS  . pantoprazole  40 mg Oral BID  . polyethylene glycol  17 g Oral Daily  . spironolactone  25 mg Oral Daily   Continuous Infusions:   LOS: 84 days    Time spent: 25 mins     [name], MD [facility] Pager [phone]  If 7PM-7AM, please contact night-coverage 01/11/2020, 7:38 AM

## 2020-01-11 NOTE — Plan of Care (Signed)
Pt resting in bed at this time,NAD noted.responds appropriately to verbal commands. Denies any pain or discomfort.Pt had paracentesis in AM r/t ascites.wound dressing is c/d/I.no drainages at site.will cont to monitor.

## 2020-01-11 NOTE — Procedures (Signed)
Ultrasound-guided diagnostic and therapeutic paracentesis performed yielding 4 liters of straw colored fluid.  No immediate complications. EBL is < 2 ml.4L maximum.

## 2020-01-12 DIAGNOSIS — K7031 Alcoholic cirrhosis of liver with ascites: Secondary | ICD-10-CM | POA: Diagnosis not present

## 2020-01-12 DIAGNOSIS — N179 Acute kidney failure, unspecified: Secondary | ICD-10-CM | POA: Diagnosis not present

## 2020-01-12 DIAGNOSIS — K652 Spontaneous bacterial peritonitis: Secondary | ICD-10-CM | POA: Diagnosis not present

## 2020-01-12 MED ORDER — APIXABAN 5 MG PO TABS
5.0000 mg | ORAL_TABLET | Freq: Two times a day (BID) | ORAL | Status: DC
  Administered 2020-01-12 – 2020-01-29 (×35): 5 mg via ORAL
  Filled 2020-01-12 (×36): qty 1

## 2020-01-12 NOTE — Progress Notes (Signed)
Nutrition Follow-up  DOCUMENTATION CODES:   Obesity unspecified  INTERVENTION:   Patient continues to decline all nutrition interventions.  Continue Rena-vit po QHS.  Liberalize diet   NUTRITION DIAGNOSIS:   Increased nutrient needs related to chronic illness (cirrhosis, CHF) as evidenced by estimated needs. Ongoing.  GOAL:   Patient will meet greater than or equal to 90% of their needs Met with calories, still progressing with protein.  MONITOR:   PO intake,Supplement acceptance,Labs,Weight trends,Skin,I & O's  ASSESSMENT:   65 y.o. 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   Pt s/p paracentesis 11/3 with 4L output   Pt reports continued good appetite and oral intake in hospital; pt documented to be eating 100% of meals. Pt had oatmeal, banana, milk and pancakes for breakfast this morning. Pt has been offered a variety of supplements and snacks in hospital but continues to refuse all nutritional interventions. RD will liberalize the heart healthy portion of pt's diet as this is restrictive of protein. Will continue renal multivitamin.   Per chart, pt appears to be around her UBW currently   Medications reviewed and include: ciprofloxacin, epoetin, iron polysaccharides, melatonin, rena-vite, protonix, miralax, aldactone  Labs reviewed: Na 133(L), BUN 55(H), creat 3.29(H)- 1/3 Hgb 10.4(L), Hct 34.7(L), MCV 73.4(L), MCH 22.0(L)- 1/3  Diet Order:   Diet Order            Diet - low sodium heart healthy           Diet Heart Room service appropriate? Yes; Fluid consistency: Thin; Fluid restriction: 1500 mL Fluid  Diet effective now                EDUCATION NEEDS:   Education needs have been addressed  Skin:  Skin  Assessment: Skin Integrity Issues: Skin Integrity Issues:: Incisions Incisions: closed incision lower abdomen  Last BM:  1/4- type 5  Height:   Ht Readings from Last 1 Encounters:  11/13/19 $RemoveB'5\' 6"'KeomZelb$  (1.676 m)   Weight:   Wt Readings from Last 1 Encounters:  01/09/20 95.7 kg   Ideal Body Weight:  59 kg  BMI:  Body mass index is 34.04 kg/m.  Estimated Nutritional Needs:   Kcal:  1900-2200kcal/day  Protein:  95-110g/day  Fluid:  1.8-2L/day  Koleen Distance MS, RD, LDN Please refer to [facility] for RD and/or RD on-call/weekend/after hours pager

## 2020-01-12 NOTE — Progress Notes (Signed)
PROGRESS NOTE    Katelyn Lane  PJA:[id number] DOB: [birth year] DOA: 10/19/2019 PCP: [name], MD    Assessment & Plan:   Principal Problem:   Spontaneous bacterial peritonitis ([place]) Active Problems:   AKI (acute kidney injury) ([place])   Crack cocaine use   Acute respiratory failure with hypoxia (HCC)   LV (left ventricular) mural thrombus   Chronic kidney disease (CKD) stage G3b/A1, moderately decreased glomerular filtration rate (GFR) between 30-44 mL/min/1.73 square meter and albuminuria creatinine ratio less than 30 mg/g (HCC)   Nonsustained ventricular tachycardia (HCC)   Acute CHF (congestive heart failure) (HCC)   Cocaine abuse ([place])   Peripheral edema   Cirrhosis of liver (HCC)   Dementia (HCC)   Aspiration pneumonia of both lower lobes due to gastric secretions (HCC)   Acute diastolic (congestive) heart failure ([place])   Cardiorenal disease   Goals of care, counseling/discussion   Palliative care by specialist   Ascites due to alcoholic cirrhosis ([place])   Alcoholic liver cirrhosis:  with ascites.  Spontaneous bacterial peritonitis. Continue on prophylactic Cipro. S/p paracentesis on 01/11/20. Will restart eliquis  Acute on chronic combined CHF: continue on spironolactone, bumex. Monitor I/Os.   Acute hypoxemic respiratory failure: much improved. Continue on nocturnal oxygen intermittently.  AKI on CKDIIIb: Cr is labile.   Left ventricular thrombus: will restart eliquis    DVT prophylaxis: eliquis Code Status: DNR Family Communication: Disposition Plan: guardianship is pending. Unclear d/c plan as pt has refused a group home. Family was suppose to come pick up pt a couple days ago but no family showed up and they did not return the pt's calls as per pt.  Now pt is saying that family member was "mentally retarded."   Status is: Inpatient  Remains inpatient appropriate because:Unsafe d/c plan, guardianship is pending still    Dispo: The patient is  from: Home              Anticipated d/c is to: SNF              Anticipated d/c date is: > 3 days              Patient currently is medically stable to d/c.        Consultants:      Procedures:    Antimicrobials: cipro   Subjective: Pt denies any complaints today   Objective: Vitals:   01/11/20 1448 01/11/20 2016 01/12/20 0020 01/12/20 0501  BP: (!) 106/96 106/85 (!) 123/95 103/78  Pulse: 90 91 74 65  Resp: 17 17 18 16   Temp: 97.9 F (36.6 C) 98.6 F (37 C) (!) 97.5 F (36.4 C) 97.6 F (36.4 C)  TempSrc:      SpO2: 100% 100% 94% 97%  Weight:      Height:       No intake or output data in the 24 hours ending 01/12/20 0718 Filed Weights   01/03/20 0451 01/05/20 0344 01/09/20 0456  Weight: 96.7 kg 95.3 kg 95.7 kg    Examination:  General exam: Appears calm & comfortable  Respiratory system: decreased breath sounds b/l  Cardiovascular system: S1/S2+. No clicks or rubs  Gastrointestinal system: Abd is soft, obese, NT & hypoactive bowel sounds  Central nervous system: Alert and oriented. Moves all 4 extremities  Psychiatry: Judgement and insight appear abnormal. Flat mood and affect    Data Reviewed: I have personally reviewed following labs and imaging studies  CBC: Recent Labs  Lab 01/06/20 0729 01/07/20  1275 01/08/20 0535 01/09/20 0443 01/10/20 0527 01/11/20 0455  WBC 5.0 4.5 4.6 4.6 4.4 4.4  NEUTROABS 2.7  --   --   --   --   --   HGB 9.8* 9.5* 9.6* 10.2* 10.0* 10.4*  HCT 32.5* 31.6* 31.8* 32.4* 32.9* 34.7*  MCV 73.7* 74.4* 74.1* 72.3* 72.0* 73.4*  PLT 381 353 356 355 371 170   Basic Metabolic Panel: Recent Labs  Lab 01/06/20 0729 01/07/20 0428 01/08/20 0535 01/09/20 0443 01/10/20 0527 01/11/20 0455  NA 134* 133* 135 134* 135 133*  K 4.0 4.3 4.4 4.2 4.0 4.0  CL 100 100 102 101 100 100  CO2 24 23 23 22 23 23   GLUCOSE 86 87 88 123* 77 91  BUN 53* 55* 59* 58* 57* 55*  CREATININE 3.44* 3.28* 3.29* 3.31* 3.40* 3.29*  CALCIUM 9.6 9.1  9.1 9.7 9.5 9.3  MG 2.2  --   --   --   --   --    GFR: Estimated Creatinine Clearance: 20.2 mL/min (A) (by C-G formula based on SCr of 3.29 mg/dL (H)). Liver Function Tests: No results for input(s): AST, ALT, ALKPHOS, BILITOT, PROT, ALBUMIN in the last 168 hours. No results for input(s): LIPASE, AMYLASE in the last 168 hours. No results for input(s): AMMONIA in the last 168 hours. Coagulation Profile: No results for input(s): INR, PROTIME in the last 168 hours. Cardiac Enzymes: No results for input(s): CKTOTAL, CKMB, CKMBINDEX, TROPONINI in the last 168 hours. BNP (last 3 results) No results for input(s): PROBNP in the last 8760 hours. HbA1C: No results for input(s): HGBA1C in the last 72 hours. CBG: No results for input(s): GLUCAP in the last 168 hours. Lipid Profile: No results for input(s): CHOL, HDL, LDLCALC, TRIG, CHOLHDL, LDLDIRECT in the last 72 hours. Thyroid Function Tests: No results for input(s): TSH, T4TOTAL, FREET4, T3FREE, THYROIDAB in the last 72 hours. Anemia Panel: No results for input(s): VITAMINB12, FOLATE, FERRITIN, TIBC, IRON, RETICCTPCT in the last 72 hours. Sepsis Labs: No results for input(s): PROCALCITON, LATICACIDVEN in the last 168 hours.  Recent Results (from the past 240 hour(s))  Resp Panel by RT-PCR (Flu A&B, Covid) Nasopharyngeal Swab     Status: None   Collection Time: 01/10/20  4:44 PM   Specimen: Nasopharyngeal Swab; Nasopharyngeal(NP) swabs in vial transport medium  Result Value Ref Range Status   SARS Coronavirus 2 by RT PCR NEGATIVE NEGATIVE Final    Comment: (NOTE) SARS-CoV-2 target nucleic acids are NOT DETECTED.  The SARS-CoV-2 RNA is generally detectable in upper respiratory specimens during the acute phase of infection. The lowest concentration of SARS-CoV-2 viral copies this assay can detect is 138 copies/mL. A negative result does not preclude SARS-Cov-2 infection and should not be used as the sole basis for treatment or other  patient management decisions. A negative result may occur with  improper specimen collection/handling, submission of specimen other than nasopharyngeal swab, presence of viral mutation(s) within the areas targeted by this assay, and inadequate number of viral copies(<138 copies/mL). A negative result must be combined with clinical observations, patient history, and epidemiological information. The expected result is Negative.  Fact Sheet for Patients:  EntrepreneurPulse.com.au  Fact Sheet for Healthcare Providers:  IncredibleEmployment.be  This test is no t yet approved or cleared by the [place] FDA and  has been authorized for detection and/or diagnosis of SARS-CoV-2 by FDA under an Emergency Use Authorization (EUA). This EUA will remain  in effect (meaning this test can be  used) for the duration of the COVID-19 declaration under Section 564(b)(1) of the Act, 21 U.S.C.section 360bbb-3(b)(1), unless the authorization is terminated  or revoked sooner.       Influenza A by PCR NEGATIVE NEGATIVE Final   Influenza B by PCR NEGATIVE NEGATIVE Final    Comment: (NOTE) The Xpert Xpress SARS-CoV-2/FLU/RSV plus assay is intended as an aid in the diagnosis of influenza from Nasopharyngeal swab specimens and should not be used as a sole basis for treatment. Nasal washings and aspirates are unacceptable for Xpert Xpress SARS-CoV-2/FLU/RSV testing.  Fact Sheet for Patients: EntrepreneurPulse.com.au  Fact Sheet for Healthcare Providers: IncredibleEmployment.be  This test is not yet approved or cleared by the [place] FDA and has been authorized for detection and/or diagnosis of SARS-CoV-2 by FDA under an Emergency Use Authorization (EUA). This EUA will remain in effect (meaning this test can be used) for the duration of the COVID-19 declaration under Section 564(b)(1) of the Act, 21 U.S.C. section  360bbb-3(b)(1), unless the authorization is terminated or revoked.  Performed at [facility], [address], [place], [place] [address]          Radiology Studies: US Paracentesis  Result Date: 01/11/2020 INDICATION: Patient with history of chronic heart failure, chronic kidney disease and alcoholic cirrhosis with recurrent ascites. Request is for therapeutic paracentesis. Maximum of 4 L. EXAM: ULTRASOUND GUIDED THERAPEUTIC PARACENTESIS MEDICATIONS: Lidocaine 1% 10 mL COMPLICATIONS: None immediate. PROCEDURE: Informed written consent was obtained from the patient after a discussion of the risks, benefits and alternatives to treatment. A timeout was performed prior to the initiation of the procedure. Initial ultrasound scanning demonstrates a large amount of ascites within the right lower abdominal quadrant. The right lower abdomen was prepped and draped in the usual sterile fashion. 1% lidocaine was used for local anesthesia. Following this, a 6 Fr Safe-T-Centesis catheter was introduced. An ultrasound image was saved for documentation purposes. The paracentesis was performed. The catheter was removed and a dressing was applied. The patient tolerated the procedure well without immediate post procedural complication. FINDINGS: A total of approximately 4 L of straw-colored fluid was removed. IMPRESSION: Successful ultrasound-guided therapeutic paracentesis yielding 4 liters of peritoneal fluid. Read by: Rushie Nyhan, NP Electronically Signed   By: Markus Daft M.D.   On: 01/11/2020 10:56        Scheduled Meds: . bumetanide  2 mg Oral BID  . ciprofloxacin  500 mg Oral Q breakfast  . epoetin (EPOGEN/PROCRIT) injection  20,000 Units Subcutaneous Weekly  . fluticasone  2 spray Each Nare Daily  . guaiFENesin  600 mg Oral TID  . iron polysaccharides  150 mg Oral Daily  . ketotifen  1 drop Both Eyes BID  . loratadine  10 mg Oral Daily  . mouth rinse  15 mL Mouth Rinse BID  .  melatonin  2.5 mg Oral QHS  . midodrine  10 mg Oral TID WC  . multivitamin  1 tablet Oral QHS  . pantoprazole  40 mg Oral BID  . polyethylene glycol  17 g Oral Daily  . spironolactone  25 mg Oral Daily   Continuous Infusions:   LOS: 85 days    Time spent: 20 mins     [name], MD [facility] Pager [phone]  If 7PM-7AM, please contact night-coverage 01/12/2020, 7:18 AM

## 2020-01-13 DIAGNOSIS — K652 Spontaneous bacterial peritonitis: Secondary | ICD-10-CM | POA: Diagnosis not present

## 2020-01-13 NOTE — Progress Notes (Signed)
PROGRESS NOTE    Katelyn Lane  FVC:[id number] DOB: [birth year] DOA: 10/19/2019 PCP: [name], MD   Brief Narrative:  Katelyn Lane a 65 y.o.femalewith medical history significant forcombined diastolic and systolic heart failure, CKD stage IIIb, history of CVA, hypertension, hyperlipidemia, ventricular thrombus on Xarelto and cocaine use who presents with concerns of lower extremity edema and increasing shortness of breath.Found to have BNP greater than 4500, elevated troponin and AKI.  After admission to the hospital, she was diagnosed with acute on chronic combinedsystolic and diastolic congestive heart failurewith ejection fraction 25 to 30%. She was not responding to IV Lasix. She was seen by nephrology, temporary dialysis catheter was performed and she was dialyzed on 10/17 and 10/18.Patient also complaining of abdominal pain, she had a paracentesis, study showed possible spontaneous peritonitis, she was treated with Rocephin and Flagyl.  Patient had a worsening respiratory status on 10/17, was diagnosed with aspiration pneumonia. She was treated with Rocephin, Zithromax and Flagyl. Patient also had abdominal pain and a CT scan suspect ischemia, but seen by general surgery on 10/19, no need for surgery.  Assessment & Plan:   Spontaneous bacterial peritonitis:  -Status post paracentesis on 01/11/2020 -Continue cipro for ppx   Acute on chronic combined systolic and diastolic congestive heart failure. Acute hypoxemic respiratory failure. -Echo from 06/22/2019 showed ejection fraction of 25 to 30% with grade 3 diastolic dysfunction. She was dialyzed x2, but now getting diuresed. Pt reported does not want to get dialysis again. -Continue on Aldactone and Bumex.  Monitor INO's. -Continue on nocturnal oxygen as needed.  Liver cirrhosis with ascites:Noted. GI consulted previously in hospitalization (see prior notes). -Continue cipro for SBP  ppx  Hypotension: Remained stable.  Continue midodrine 10 mg TID.    Acute renal failure on chronic kidney disease stage IIIb: -Patient refused for dialysis.  Creatinine: 3.29, GFR: 15.  Monitor kidney function closely.  Hyponatremia: Mild.  -Improved from [phone].  Continue to monitor.  She is asymptomatic.  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  H&H is stable between 9-10.  MCV: 71.2.  -cont iron supplement -No indication of transfusion at this time.  GERD: Continue PPI   Chronic constipation: cont. MiraLAX.  DVT prophylaxis: Eliquis Code Status: DNR Family Communication:  None present at bedside.  Plan of care discussed with patient in length and she verbalized understanding and agreed with it. Disposition Plan: Katelyn Lane is pending.  Unclear DC plan at this time as patient has refused a group home.  Family was disposed to come pick her up few days ago but no family showed up and they did not return the patient was called as per the patient.  Consultants:   Nephrology  GI  Procedures:  Paracentesis Antimicrobials:   Cipro  Status is: Inpatient   Dispo: The patient is from: Home              Anticipated d/c is to: SNF              Anticipated d/c date is: > 3 days              Patient currently is medically stable to d/c.    Subjective: Patient seen and examined.  Sitting comfortably on the bed.  Tells me that her hands and feet are cold.  No new complaints.  Remained afebrile.  No acute events overnight.  Objective: Vitals:   01/12/20 1614 01/12/20 2136  01/13/20 0604 01/13/20 0804  BP: 101/73 108/86 102/60 103/72  Pulse: (!) 57 71 66 70  Resp: 16 17 16 18   Temp: 98.7 F (37.1 C) (!) 97.5 F (36.4 C) 97.6 F (36.4 C) 97.8 F (36.6 C)  TempSrc: Oral   Oral  SpO2: 100% 93% 100% 98%  Weight:      Height:        Intake/Output Summary (Last 24 hours) at 01/13/2020  1210 Last data filed at 01/12/2020 1849 Gross per 24 hour  Intake 140 ml  Output -  Net 140 ml   Filed Weights   01/03/20 0451 01/05/20 0344 01/09/20 0456  Weight: 96.7 kg 95.3 kg 95.7 kg    Examination:  General exam: Appears calm and comfortable, on room air, communicating well Respiratory system: Clear to auscultation. Respiratory effort normal.  No wheezing or rhonchi. Cardiovascular system: S1 & S2 heard, RRR. No JVD, murmurs, rubs, gallops or clicks. No pedal edema. Gastrointestinal system: Abdomen is obese, soft and nontender. No organomegaly or masses felt. Normal bowel sounds heard. Central nervous system: Alert and oriented. No focal neurological deficits. Extremities: Symmetric 5 x 5 power. Skin: No rashes, lesions or ulcers    Data Reviewed: I have personally reviewed following labs and imaging studies  CBC: Recent Labs  Lab 01/07/20 0428 01/08/20 0535 01/09/20 0443 01/10/20 0527 01/11/20 0455  WBC 4.5 4.6 4.6 4.4 4.4  HGB 9.5* 9.6* 10.2* 10.0* 10.4*  HCT 31.6* 31.8* 32.4* 32.9* 34.7*  MCV 74.4* 74.1* 72.3* 72.0* 73.4*  PLT 353 356 355 371 035   Basic Metabolic Panel: Recent Labs  Lab 01/07/20 0428 01/08/20 0535 01/09/20 0443 01/10/20 0527 01/11/20 0455  NA 133* 135 134* 135 133*  K 4.3 4.4 4.2 4.0 4.0  CL 100 102 101 100 100  CO2 23 23 22 23 23   GLUCOSE 87 88 123* 77 91  BUN 55* 59* 58* 57* 55*  CREATININE 3.28* 3.29* 3.31* 3.40* 3.29*  CALCIUM 9.1 9.1 9.7 9.5 9.3   GFR: Estimated Creatinine Clearance: 20.2 mL/min (A) (by C-G formula based on SCr of 3.29 mg/dL (H)). Liver Function Tests: No results for input(s): AST, ALT, ALKPHOS, BILITOT, PROT, ALBUMIN in the last 168 hours. No results for input(s): LIPASE, AMYLASE in the last 168 hours. No results for input(s): AMMONIA in the last 168 hours. Coagulation Profile: No results for input(s): INR, PROTIME in the last 168 hours. Cardiac Enzymes: No results for input(s): CKTOTAL, CKMB, CKMBINDEX,  TROPONINI in the last 168 hours. BNP (last 3 results) No results for input(s): PROBNP in the last 8760 hours. HbA1C: No results for input(s): HGBA1C in the last 72 hours. CBG: No results for input(s): GLUCAP in the last 168 hours. Lipid Profile: No results for input(s): CHOL, HDL, LDLCALC, TRIG, CHOLHDL, LDLDIRECT in the last 72 hours. Thyroid Function Tests: No results for input(s): TSH, T4TOTAL, FREET4, T3FREE, THYROIDAB in the last 72 hours. Anemia Panel: No results for input(s): VITAMINB12, FOLATE, FERRITIN, TIBC, IRON, RETICCTPCT in the last 72 hours. Sepsis Labs: No results for input(s): PROCALCITON, LATICACIDVEN in the last 168 hours.  Recent Results (from the past 240 hour(s))  Resp Panel by RT-PCR (Flu A&B, Covid) Nasopharyngeal Swab     Status: None   Collection Time: 01/10/20  4:44 PM   Specimen: Nasopharyngeal Swab; Nasopharyngeal(NP) swabs in vial transport medium  Result Value Ref Range Status   SARS Coronavirus 2 by RT PCR NEGATIVE NEGATIVE Final    Comment: (NOTE) SARS-CoV-2 target nucleic acids  are NOT DETECTED.  The SARS-CoV-2 RNA is generally detectable in upper respiratory specimens during the acute phase of infection. The lowest concentration of SARS-CoV-2 viral copies this assay can detect is 138 copies/mL. A negative result does not preclude SARS-Cov-2 infection and should not be used as the sole basis for treatment or other patient management decisions. A negative result may occur with  improper specimen collection/handling, submission of specimen other than nasopharyngeal swab, presence of viral mutation(s) within the areas targeted by this assay, and inadequate number of viral copies(<138 copies/mL). A negative result must be combined with clinical observations, patient history, and epidemiological information. The expected result is Negative.  Fact Sheet for Patients:  EntrepreneurPulse.com.au  Fact Sheet for Healthcare Providers:   IncredibleEmployment.be  This test is no t yet approved or cleared by the [place] FDA and  has been authorized for detection and/or diagnosis of SARS-CoV-2 by FDA under an Emergency Use Authorization (EUA). This EUA will remain  in effect (meaning this test can be used) for the duration of the COVID-19 declaration under Section 564(b)(1) of the Act, 21 U.S.C.section 360bbb-3(b)(1), unless the authorization is terminated  or revoked sooner.       Influenza A by PCR NEGATIVE NEGATIVE Final   Influenza B by PCR NEGATIVE NEGATIVE Final    Comment: (NOTE) The Xpert Xpress SARS-CoV-2/FLU/RSV plus assay is intended as an aid in the diagnosis of influenza from Nasopharyngeal swab specimens and should not be used as a sole basis for treatment. Nasal washings and aspirates are unacceptable for Xpert Xpress SARS-CoV-2/FLU/RSV testing.  Fact Sheet for Patients: EntrepreneurPulse.com.au  Fact Sheet for Healthcare Providers: IncredibleEmployment.be  This test is not yet approved or cleared by the [place] FDA and has been authorized for detection and/or diagnosis of SARS-CoV-2 by FDA under an Emergency Use Authorization (EUA). This EUA will remain in effect (meaning this test can be used) for the duration of the COVID-19 declaration under Section 564(b)(1) of the Act, 21 U.S.C. section 360bbb-3(b)(1), unless the authorization is terminated or revoked.  Performed at [facility], [address], [place], [place] [address]       Radiology Studies: No results found.  Scheduled Meds: . apixaban  5 mg Oral BID  . bumetanide  2 mg Oral BID  . ciprofloxacin  500 mg Oral Q breakfast  . epoetin (EPOGEN/PROCRIT) injection  20,000 Units Subcutaneous Weekly  . fluticasone  2 spray Each Nare Daily  . guaiFENesin  600 mg Oral TID  . iron polysaccharides  150 mg Oral Daily  . ketotifen  1 drop Both Eyes BID  .  loratadine  10 mg Oral Daily  . mouth rinse  15 mL Mouth Rinse BID  . melatonin  2.5 mg Oral QHS  . midodrine  10 mg Oral TID WC  . multivitamin  1 tablet Oral QHS  . pantoprazole  40 mg Oral BID  . polyethylene glycol  17 g Oral Daily  . spironolactone  25 mg Oral Daily   Continuous Infusions:   LOS: 86 days   Time spent: 35 minutes.   Mckinley Jewel, MD [facility]  If 7PM-7AM, please contact night-coverage [email or web address] 01/13/2020, 12:10 PM

## 2020-01-14 DIAGNOSIS — K652 Spontaneous bacterial peritonitis: Secondary | ICD-10-CM | POA: Diagnosis not present

## 2020-01-14 MED ORDER — WHITE PETROLATUM EX OINT
TOPICAL_OINTMENT | CUTANEOUS | Status: DC | PRN
  Administered 2020-01-14: 1 via TOPICAL
  Filled 2020-01-14 (×2): qty 5

## 2020-01-14 NOTE — Progress Notes (Signed)
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  After admission to the hospital, she was diagnosed with acute on chronic combinedsystolic and diastolic congestive heart failurewith ejection fraction 25 to 30%. She was not responding to IV Lasix. She was seen by nephrology, temporary dialysis catheter was performed and she was dialyzed on 10/17 and 10/18.Patient also complaining of abdominal pain, she had a paracentesis, study showed possible spontaneous peritonitis, she was treated with Rocephin and Flagyl.  Patient had a worsening respiratory status on 10/17, was diagnosed with aspiration pneumonia. She was treated with Rocephin, Zithromax and Flagyl. Patient also had abdominal pain and a CT scan suspect ischemia, but seen by general surgery on 10/19, no need for surgery.  Assessment & Plan:   Spontaneous bacterial peritonitis:  -Status post paracentesis on 01/11/2020 -Continue cipro for ppx   Acute on chronic combined systolic and diastolic congestive heart failure. Acute hypoxemic respiratory failure. -Echo from 06/22/2019 showed ejection fraction of 25 to 30% with grade 3 diastolic dysfunction. She was dialyzed x2, but now getting diuresed. Pt reported does not want to get dialysis again. -Continue on Aldactone and Bumex.  Monitor INO's. -Continue on nocturnal oxygen as needed.  Liver cirrhosis with ascites:Noted. GI consulted previously in hospitalization (see prior notes). -Continue cipro for SBP  ppx  Hypotension: Remained stable.  Continue midodrine 10 mg TID.    Acute renal failure on chronic kidney disease stage IIIb: -Patient refused for dialysis.  Creatinine: 3.29, GFR: 15.  Monitor kidney function closely.  Hyponatremia: Mild.  -Improved from [phone].  Continue to monitor.  She is asymptomatic.  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  H&H is stable between 9-10.  MCV: 71.2.  -cont iron supplement -No indication of transfusion at this time.  GERD: Continue PPI   Chronic constipation: cont. MiraLAX.  Disposition: Patient no longer meets criteria for SNF.  No family at home to discharge to.  Regarding guardianship-it could take up to 60 days for patient to be assessed for the first time and it is unclear if patient would be accepted by DSS for possible guardianship.  I talked to the patient about group home-tells me that "she is willing to go but the question is which group home?"  DVT prophylaxis: Eliquis Code Status: DNR Family Communication:  None present at bedside.  Plan of care discussed with patient in length and she verbalized understanding and agreed with it. Disposition Plan: Unclear discharge plan. Consultants:   Nephrology  GI  Psych  Procedures:  Paracentesis Antimicrobials:   Cipro  Status is: Inpatient   Dispo: The patient is from: Home              Anticipated d/c is to: Group home?              Anticipated d/c date is: > 3 days              Patient currently is medically stable to d/c.    Subjective: Patient seen and examined.  Sitting comfortably on the chair eating  breakfast.  Asking for steroid cream for her face.  Remained afebrile.  No acute events overnight.  No other complaints.  Objective: Vitals:   01/13/20 1630 01/14/20 0132 01/14/20 0837 01/14/20 1225  BP:  109/80 105/87 114/88  Pulse:  74 85 68  Resp:  19 16 16   Temp:  (!) 97.5 F  (36.4 C) 97.6 F (36.4 C) 97.6 F (36.4 C)  TempSrc: Oral     SpO2:  100% 100% 100%  Weight:      Height:       No intake or output data in the 24 hours ending 01/14/20 1629 Filed Weights   01/03/20 0451 01/05/20 0344 01/09/20 0456  Weight: 96.7 kg 95.3 kg 95.7 kg    Examination:  General exam: Appears calm and comfortable, on 2 L of oxygen via nasal cannula, communicating well Respiratory system: Clear to auscultation. Respiratory effort normal.  No wheezing or rhonchi. Cardiovascular system: S1 & S2 heard, RRR. No JVD, murmurs, rubs, gallops or clicks. No pedal edema. Gastrointestinal system: Abdomen is obese, soft and nontender. No organomegaly or masses felt. Normal bowel sounds heard. Central nervous system: Alert and oriented. No focal neurological deficits. Extremities: Symmetric 5 x 5 power. Skin: No rashes, lesions or ulcers    Data Reviewed: I have personally reviewed following labs and imaging studies  CBC: Recent Labs  Lab 01/08/20 0535 01/09/20 0443 01/10/20 0527 01/11/20 0455  WBC 4.6 4.6 4.4 4.4  HGB 9.6* 10.2* 10.0* 10.4*  HCT 31.8* 32.4* 32.9* 34.7*  MCV 74.1* 72.3* 72.0* 73.4*  PLT 356 355 371 161   Basic Metabolic Panel: Recent Labs  Lab 01/08/20 0535 01/09/20 0443 01/10/20 0527 01/11/20 0455  NA 135 134* 135 133*  K 4.4 4.2 4.0 4.0  CL 102 101 100 100  CO2 23 22 23 23   GLUCOSE 88 123* 77 91  BUN 59* 58* 57* 55*  CREATININE 3.29* 3.31* 3.40* 3.29*  CALCIUM 9.1 9.7 9.5 9.3   GFR: Estimated Creatinine Clearance: 20.2 mL/min (A) (by C-G formula based on SCr of 3.29 mg/dL (H)). Liver Function Tests: No results for input(s): AST, ALT, ALKPHOS, BILITOT, PROT, ALBUMIN in the last 168 hours. No results for input(s): LIPASE, AMYLASE in the last 168 hours. No results for input(s): AMMONIA in the last 168 hours. Coagulation Profile: No results for input(s): INR, PROTIME in the last 168 hours. Cardiac Enzymes: No results for input(s): CKTOTAL,  CKMB, CKMBINDEX, TROPONINI in the last 168 hours. BNP (last 3 results) No results for input(s): PROBNP in the last 8760 hours. HbA1C: No results for input(s): HGBA1C in the last 72 hours. CBG: No results for input(s): GLUCAP in the last 168 hours. Lipid Profile: No results for input(s): CHOL, HDL, LDLCALC, TRIG, CHOLHDL, LDLDIRECT in the last 72 hours. Thyroid Function Tests: No results for input(s): TSH, T4TOTAL, FREET4, T3FREE, THYROIDAB in the last 72 hours. Anemia Panel: No results for input(s): VITAMINB12, FOLATE, FERRITIN, TIBC, IRON, RETICCTPCT in the last 72 hours. Sepsis Labs: No results for input(s): PROCALCITON, LATICACIDVEN in the last 168 hours.  Recent Results (from the past 240 hour(s))  Resp Panel by RT-PCR (Flu A&B, Covid) Nasopharyngeal Swab     Status: None   Collection Time: 01/10/20  4:44 PM   Specimen: Nasopharyngeal Swab; Nasopharyngeal(NP) swabs in vial transport medium  Result Value Ref Range Status   SARS Coronavirus 2 by RT PCR NEGATIVE NEGATIVE Final    Comment: (NOTE) SARS-CoV-2 target nucleic acids are NOT DETECTED.  The SARS-CoV-2  RNA is generally detectable in upper respiratory specimens during the acute phase of infection. The lowest concentration of SARS-CoV-2 viral copies this assay can detect is 138 copies/mL. A negative result does not preclude SARS-Cov-2 infection and should not be used as the sole basis for treatment or other patient management decisions. A negative result may occur with  improper specimen collection/handling, submission of specimen other than nasopharyngeal swab, presence of viral mutation(s) within the areas targeted by this assay, and inadequate number of viral copies(<138 copies/mL). A negative result must be combined with clinical observations, patient history, and epidemiological information. The expected result is Negative.  Fact Sheet for Patients:  EntrepreneurPulse.com.au  Fact Sheet for  Healthcare Providers:  IncredibleEmployment.be  This test is no t yet approved or cleared by the [place] FDA and  has been authorized for detection and/or diagnosis of SARS-CoV-2 by FDA under an Emergency Use Authorization (EUA). This EUA will remain  in effect (meaning this test can be used) for the duration of the COVID-19 declaration under Section 564(b)(1) of the Act, 21 U.S.C.section 360bbb-3(b)(1), unless the authorization is terminated  or revoked sooner.       Influenza A by PCR NEGATIVE NEGATIVE Final   Influenza B by PCR NEGATIVE NEGATIVE Final    Comment: (NOTE) The Xpert Xpress SARS-CoV-2/FLU/RSV plus assay is intended as an aid in the diagnosis of influenza from Nasopharyngeal swab specimens and should not be used as a sole basis for treatment. Nasal washings and aspirates are unacceptable for Xpert Xpress SARS-CoV-2/FLU/RSV testing.  Fact Sheet for Patients: EntrepreneurPulse.com.au  Fact Sheet for Healthcare Providers: IncredibleEmployment.be  This test is not yet approved or cleared by the [place] FDA and has been authorized for detection and/or diagnosis of SARS-CoV-2 by FDA under an Emergency Use Authorization (EUA). This EUA will remain in effect (meaning this test can be used) for the duration of the COVID-19 declaration under Section 564(b)(1) of the Act, 21 U.S.C. section 360bbb-3(b)(1), unless the authorization is terminated or revoked.  Performed at [facility], [address] [place], [place] [address]       Radiology Studies: No results found.  Scheduled Meds: . apixaban  5 mg Oral BID  . bumetanide  2 mg Oral BID  . ciprofloxacin  500 mg Oral Q breakfast  . epoetin (EPOGEN/PROCRIT) injection  20,000 Units Subcutaneous Weekly  . fluticasone  2 spray Each Nare Daily  . guaiFENesin  600 mg Oral TID  . iron polysaccharides  150 mg Oral Daily  . ketotifen  1 drop  Both Eyes BID  . loratadine  10 mg Oral Daily  . mouth rinse  15 mL Mouth Rinse BID  . melatonin  2.5 mg Oral QHS  . midodrine  10 mg Oral TID WC  . multivitamin  1 tablet Oral QHS  . pantoprazole  40 mg Oral BID  . polyethylene glycol  17 g Oral Daily  . spironolactone  25 mg Oral Daily   Continuous Infusions:   LOS: 87 days   Time spent: 35 minutes.   Mckinley Jewel, MD [facility]  If 7PM-7AM, please contact night-coverage [email or web address] 01/14/2020, 4:29 PM

## 2020-01-14 NOTE — Progress Notes (Signed)
   01/14/20 1200  Clinical Encounter Type  Visited With Patient  Visit Type Follow-up  Referral From Chaplain  Consult/Referral To Chaplain  While rounding, chaplain stopped to check on Pt, but she was on the telephone. She wanted chaplain to wait, but chaplain was on her way to see someone else. Chaplain will follow up.

## 2020-01-14 NOTE — TOC Progression Note (Signed)
Transition of Care [facility]) - Progression Note    Patient Details  Name: Katelyn Lane MRN: [id number] Date of Birth: [birth year]  Transition of Care [facility]) CM/SW Contact  Anselm Pancoast, RN Phone Number: 01/14/2020, 11:39 AM  Clinical Narrative:    Patient remains in hospital refusing discharge to group home. Patient no longer meets criteria for SNF and reports having no family or home to discharge to. Some question has been raised regarding guardianship and DSS is aware however reported it could take up to 60 days for patient to be assessed for the first time and it is unclear if patient would be accepted by DSS for possible guardianship. Patient continues to report that she desires for hospital staff to find her an apartment. Patient has made no movement to find resources in the community. Patient has a vehicle that resides at her sister's home but is unclear if it is in working condition. Family refuses to assist with her care. Patient adamant she wants to discharge from hospital but will only accept her own apartment.         Expected Discharge Plan and Services           Expected Discharge Date: 01/08/20                                     Social Determinants of Health (SDOH) Interventions    Readmission Risk Interventions Readmission Risk Prevention Plan 09/02/2019 08/14/2019 07/27/2019  Transportation Screening Complete Complete Complete  Medication Review [employer] photographer) Complete Referral to Pharmacy -  PCP or Specialist appointment within 3-5 days of discharge Complete Complete Complete  HRI or Home Care Consult - Complete Complete  SW Recovery Care/Counseling Consult - Complete Complete  Palliative Care Screening Not Applicable Not Applicable Not Applicable  Skilled Nursing Facility Complete Not Applicable Not Applicable  Some recent data might be hidden

## 2020-01-14 NOTE — Progress Notes (Signed)
Pt requesting to have diet changed back to heart healthy and   low sodium. MD notified. Orders received to change back to cardiac. Order placed.

## 2020-01-15 DIAGNOSIS — K652 Spontaneous bacterial peritonitis: Secondary | ICD-10-CM | POA: Diagnosis not present

## 2020-01-15 LAB — CBC
HCT: 34 % — ABNORMAL LOW (ref 36.0–46.0)
Hemoglobin: 10.2 g/dL — ABNORMAL LOW (ref 12.0–15.0)
MCH: 22 pg — ABNORMAL LOW (ref 26.0–34.0)
MCHC: 30 g/dL (ref 30.0–36.0)
MCV: 73.4 fL — ABNORMAL LOW (ref 80.0–100.0)
Platelets: 332 10*3/uL (ref 150–400)
RBC: 4.63 MIL/uL (ref 3.87–5.11)
RDW: 18 % — ABNORMAL HIGH (ref 11.5–15.5)
WBC: 4.4 10*3/uL (ref 4.0–10.5)
nRBC: 0 % (ref 0.0–0.2)

## 2020-01-15 LAB — BASIC METABOLIC PANEL
Anion gap: 11 (ref 5–15)
BUN: 56 mg/dL — ABNORMAL HIGH (ref 8–23)
CO2: 23 mmol/L (ref 22–32)
Calcium: 8.9 mg/dL (ref 8.9–10.3)
Chloride: 103 mmol/L (ref 98–111)
Creatinine, Ser: 2.96 mg/dL — ABNORMAL HIGH (ref 0.44–1.00)
GFR, Estimated: 17 mL/min — ABNORMAL LOW (ref >60)
Glucose, Bld: 157 mg/dL — ABNORMAL HIGH (ref 70–99)
Potassium: 3.9 mmol/L (ref 3.5–5.1)
Sodium: 137 mmol/L (ref 135–145)

## 2020-01-15 LAB — MAGNESIUM: Magnesium: 2 mg/dL (ref 1.7–2.4)

## 2020-01-15 LAB — CREATININE, SERUM
Creatinine, Ser: 2.98 mg/dL — ABNORMAL HIGH (ref 0.44–1.00)
GFR, Estimated: 17 mL/min — ABNORMAL LOW (ref >60)

## 2020-01-15 NOTE — Plan of Care (Signed)
Pt resting in bed at this time,NAD noted.Pt calm and cooperative.some occasional cough d/t congestion,receiving mucinex and cough/deep breathe to clear cough.sputum clear and thick.Appetite and fluid intake good.BM x2 during this shift.Ambulates to the RR to void prn.no verbal c/o or distress.will cont to monitor.

## 2020-01-15 NOTE — Progress Notes (Signed)
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  After admission to the hospital, she was diagnosed with acute on chronic combinedsystolic and diastolic congestive heart failurewith ejection fraction 25 to 30%. She was not responding to IV Lasix. She was seen by nephrology, temporary dialysis catheter was performed and she was dialyzed on 10/17 and 10/18.Patient also complaining of abdominal pain, she had a paracentesis, study showed possible spontaneous peritonitis, she was treated with Rocephin and Flagyl.  Patient had a worsening respiratory status on 10/17, was diagnosed with aspiration pneumonia. She was treated with Rocephin, Zithromax and Flagyl. Patient also had abdominal pain and a CT scan suspect ischemia, but seen by general surgery on 10/19, no need for surgery.  Assessment & Plan:   Spontaneous bacterial peritonitis:  -Status post paracentesis on 01/11/2020 -Continue cipro for ppx   Acute on chronic combined systolic and diastolic congestive heart failure. Acute hypoxemic respiratory failure. -Echo from 06/22/2019 showed ejection fraction of 25 to 30% with grade 3 diastolic dysfunction. She was dialyzed x2, but now getting diuresed. Pt reported does not want to get dialysis again. -Continue on Aldactone and Bumex.  Monitor INO's. -Continue on nocturnal oxygen as needed.  Liver cirrhosis with ascites:Noted. GI consulted previously in hospitalization (see prior notes). -Continue cipro for SBP  ppx  Hypotension: Remained stable.  Continue midodrine 10 mg TID.    Acute renal failure on chronic kidney disease stage IIIb: -Patient refused for dialysis.  Creatinine: 3.29, GFR: 15.  Monitor kidney function closely.  Hyponatremia: Mild.  -Improved from [phone].  Continue to monitor.  She is asymptomatic.  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  H&H is stable between 9-10.  -cont iron supplement -No indication of transfusion at this time.  GERD: Continue PPI   Chronic constipation: cont. MiraLAX.  Disposition: Patient no longer meets criteria for SNF.  No family at home to discharge to.  Regarding guardianship-it could take up to 60 days for patient to be assessed for the first time and it is unclear if patient would be accepted by DSS for possible guardianship.  1/7: I discussed with the patient about the group home she clearly refused to go to group home-said that she does not like the area where Group home is.  She tells me that she is okay to go to [place] or [facility] however she does not qualifies for that. I will try to talk to her again tomorrow & convince her for group home which is the only available option for her at this time.  DVT prophylaxis: Eliquis Code Status: DNR Family Communication:  None present at bedside.  Plan of care discussed with patient in length and she verbalized understanding and agreed with it. Disposition Plan: Unclear discharge plan. Consultants:   Nephrology  GI  Psych  Procedures:  Paracentesis Antimicrobials:   Cipro  Status is: Inpatient   Dispo: The patient is from: Home              Anticipated d/c is to: Group home?  Anticipated d/c date is: > 3 days              Patient currently is medically stable to d/c.    Subjective: Patient seen and examined.  Complaining of cramps in her feet.  No acute events  overnight.  Remained afebrile.  Denies chest pain, shortness of breath or leg swelling.  Objective: Vitals:   01/14/20 2308 01/15/20 0419 01/15/20 0759 01/15/20 1239  BP: (!) 121/103 121/86 105/78 (!) 115/92  Pulse: 64 75 76 78  Resp: 16 18 18 18   Temp: 98.1 F (36.7 C) (!) 97.3 F (36.3 C) 98.6 F (37 C) 97.7 F (36.5 C)  TempSrc:  Oral Oral Oral  SpO2: 100% 98% 100% 100%  Weight:      Height:        Intake/Output Summary (Last 24 hours) at 01/15/2020 1554 Last data filed at 01/15/2020 1415 Gross per 24 hour  Intake 240 ml  Output --  Net 240 ml   Filed Weights   01/03/20 0451 01/05/20 0344 01/09/20 0456  Weight: 96.7 kg 95.3 kg 95.7 kg    Examination:  General exam: Appears calm and comfortable, on 2 L of oxygen via nasal cannula, communicating well Respiratory system: Clear to auscultation. Respiratory effort normal.  No wheezing or rhonchi. Cardiovascular system: S1 & S2 heard, RRR. No JVD, murmurs, rubs, gallops or clicks. No pedal edema. Gastrointestinal system: Abdomen is obese, soft and nontender. No organomegaly or masses felt. Normal bowel sounds heard. Central nervous system: Alert and oriented. No focal neurological deficits. Extremities: Symmetric 5 x 5 power. Skin: No rashes, lesions or ulcers    Data Reviewed: I have personally reviewed following labs and imaging studies  CBC: Recent Labs  Lab 01/09/20 0443 01/10/20 0527 01/11/20 0455 01/15/20 0707  WBC 4.6 4.4 4.4 4.4  HGB 10.2* 10.0* 10.4* 10.2*  HCT 32.4* 32.9* 34.7* 34.0*  MCV 72.3* 72.0* 73.4* 73.4*  PLT 355 371 349 371   Basic Metabolic Panel: Recent Labs  Lab 01/09/20 0443 01/10/20 0527 01/11/20 0455 01/15/20 0707 01/15/20 0926  NA 134* 135 133*  --  137  K 4.2 4.0 4.0  --  3.9  CL 101 100 100  --  103  CO2 22 23 23   --  23  GLUCOSE 123* 77 91  --  157*  BUN 58* 57* 55*  --  56*  CREATININE 3.31* 3.40* 3.29* 2.98* 2.96*  CALCIUM 9.7 9.5 9.3  --  8.9  MG  --   --   --   --   2.0   GFR: Estimated Creatinine Clearance: 22.4 mL/min (A) (by C-G formula based on SCr of 2.96 mg/dL (H)). Liver Function Tests: No results for input(s): AST, ALT, ALKPHOS, BILITOT, PROT, ALBUMIN in the last 168 hours. No results for input(s): LIPASE, AMYLASE in the last 168 hours. No results for input(s): AMMONIA in the last 168 hours. Coagulation Profile: No results for input(s): INR, PROTIME in the last 168 hours. Cardiac Enzymes: No results for input(s): CKTOTAL, CKMB, CKMBINDEX, TROPONINI in the last 168 hours. BNP (last 3 results) No results for input(s): PROBNP in the last 8760 hours. HbA1C: No results for input(s): HGBA1C in the last 72 hours. CBG: No results for input(s): GLUCAP in the last 168 hours. Lipid Profile: No results for input(s): CHOL, HDL, LDLCALC, TRIG, CHOLHDL, LDLDIRECT in the last 72 hours. Thyroid Function Tests: No results for input(s): TSH, T4TOTAL, FREET4, T3FREE, THYROIDAB in the last 72 hours. Anemia Panel:  No results for input(s): VITAMINB12, FOLATE, FERRITIN, TIBC, IRON, RETICCTPCT in the last 72 hours. Sepsis Labs: No results for input(s): PROCALCITON, LATICACIDVEN in the last 168 hours.  Recent Results (from the past 240 hour(s))  Resp Panel by RT-PCR (Flu A&B, Covid) Nasopharyngeal Swab     Status: None   Collection Time: 01/10/20  4:44 PM   Specimen: Nasopharyngeal Swab; Nasopharyngeal(NP) swabs in vial transport medium  Result Value Ref Range Status   SARS Coronavirus 2 by RT PCR NEGATIVE NEGATIVE Final    Comment: (NOTE) SARS-CoV-2 target nucleic acids are NOT DETECTED.  The SARS-CoV-2 RNA is generally detectable in upper respiratory specimens during the acute phase of infection. The lowest concentration of SARS-CoV-2 viral copies this assay can detect is 138 copies/mL. A negative result does not preclude SARS-Cov-2 infection and should not be used as the sole basis for treatment or other patient management decisions. A negative result  may occur with  improper specimen collection/handling, submission of specimen other than nasopharyngeal swab, presence of viral mutation(s) within the areas targeted by this assay, and inadequate number of viral copies(<138 copies/mL). A negative result must be combined with clinical observations, patient history, and epidemiological information. The expected result is Negative.  Fact Sheet for Patients:  EntrepreneurPulse.com.au  Fact Sheet for Healthcare Providers:  IncredibleEmployment.be  This test is no t yet approved or cleared by the [place] FDA and  has been authorized for detection and/or diagnosis of SARS-CoV-2 by FDA under an Emergency Use Authorization (EUA). This EUA will remain  in effect (meaning this test can be used) for the duration of the COVID-19 declaration under Section 564(b)(1) of the Act, 21 U.S.C.section 360bbb-3(b)(1), unless the authorization is terminated  or revoked sooner.       Influenza A by PCR NEGATIVE NEGATIVE Final   Influenza B by PCR NEGATIVE NEGATIVE Final    Comment: (NOTE) The Xpert Xpress SARS-CoV-2/FLU/RSV plus assay is intended as an aid in the diagnosis of influenza from Nasopharyngeal swab specimens and should not be used as a sole basis for treatment. Nasal washings and aspirates are unacceptable for Xpert Xpress SARS-CoV-2/FLU/RSV testing.  Fact Sheet for Patients: EntrepreneurPulse.com.au  Fact Sheet for Healthcare Providers: IncredibleEmployment.be  This test is not yet approved or cleared by the [place] FDA and has been authorized for detection and/or diagnosis of SARS-CoV-2 by FDA under an Emergency Use Authorization (EUA). This EUA will remain in effect (meaning this test can be used) for the duration of the COVID-19 declaration under Section 564(b)(1) of the Act, 21 U.S.C. section 360bbb-3(b)(1), unless the authorization is terminated  or revoked.  Performed at [facility], [address] [place], [place] [address]       Radiology Studies: No results found.  Scheduled Meds: . apixaban  5 mg Oral BID  . bumetanide  2 mg Oral BID  . ciprofloxacin  500 mg Oral Q breakfast  . epoetin (EPOGEN/PROCRIT) injection  20,000 Units Subcutaneous Weekly  . fluticasone  2 spray Each Nare Daily  . guaiFENesin  600 mg Oral TID  . iron polysaccharides  150 mg Oral Daily  . ketotifen  1 drop Both Eyes BID  . loratadine  10 mg Oral Daily  . mouth rinse  15 mL Mouth Rinse BID  . melatonin  2.5 mg Oral QHS  . midodrine  10 mg Oral TID WC  . multivitamin  1 tablet Oral QHS  . pantoprazole  40 mg Oral BID  . polyethylene glycol  17 g Oral Daily  . spironolactone  25 mg Oral Daily   Continuous Infusions:   LOS: 88 days   Time spent: 35 minutes.   Mckinley Jewel, MD [facility]  If 7PM-7AM, please contact night-coverage [email or web address] 01/15/2020, 3:54 PM

## 2020-01-16 DIAGNOSIS — K652 Spontaneous bacterial peritonitis: Secondary | ICD-10-CM | POA: Diagnosis not present

## 2020-01-16 MED ORDER — TRAMADOL HCL 50 MG PO TABS
50.0000 mg | ORAL_TABLET | Freq: Two times a day (BID) | ORAL | Status: DC | PRN
  Administered 2020-01-16 – 2020-01-27 (×6): 50 mg via ORAL
  Filled 2020-01-16 (×6): qty 1

## 2020-01-16 NOTE — Plan of Care (Signed)
Pt alert and oriented x4, on room air, ambulates without assistance. She is able to make needs known. Reported pain on her leg and PRN tylenol was given with relief. Call bell within reach. No acute concerns at this time.  Problem: Clinical Measurements: Goal: Will remain free from infection Outcome: Progressing   Problem: Activity: Goal: Risk for activity intolerance will decrease Outcome: Progressing   Problem: Pain Managment: Goal: General experience of comfort will improve Outcome: Progressing

## 2020-01-16 NOTE — Progress Notes (Signed)
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  After admission to the hospital, she was diagnosed with acute on chronic combinedsystolic and diastolic congestive heart failurewith ejection fraction 25 to 30%. She was not responding to IV Lasix. She was seen by nephrology, temporary dialysis catheter was performed and she was dialyzed on 10/17 and 10/18.Patient also complaining of abdominal pain, she had a paracentesis, study showed possible spontaneous peritonitis, she was treated with Rocephin and Flagyl.  Patient had a worsening respiratory status on 10/17, was diagnosed with aspiration pneumonia. She was treated with Rocephin, Zithromax and Flagyl. Patient also had abdominal pain and a CT scan suspect ischemia, but seen by general surgery on 10/19, no need for surgery.  Assessment & Plan:   Spontaneous bacterial peritonitis:  -Status post paracentesis on 01/11/2020 -Continue cipro for ppx   Acute on chronic combined systolic and diastolic congestive heart failure. Acute hypoxemic respiratory failure. -Echo from 06/22/2019 showed ejection fraction of 25 to 30% with grade 3 diastolic dysfunction. She was dialyzed x2, but now getting diuresed. Pt reported does not want to get dialysis again. -Continue on Aldactone and Bumex.  Monitor INO's. -Continue on nocturnal oxygen as needed.  Liver cirrhosis with ascites:Noted. GI consulted previously in hospitalization (see prior notes). -Continue cipro for SBP  ppx  Hypotension: Remained stable.  Continue midodrine 10 mg TID.    Acute renal failure on chronic kidney disease stage IIIb: -Patient refused for dialysis.  Creatinine: 3.29, GFR: 15.  Monitor kidney function closely.  Hyponatremia: Mild.  -Improved from [phone].  Continue to monitor.  She is asymptomatic.  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  H&H is stable between 9-10.  -cont iron supplement -No indication of transfusion at this time.  GERD: Continue PPI   Chronic constipation: cont. MiraLAX.  Disposition: Patient no longer meets criteria for SNF.  No family at home to discharge to.  Regarding guardianship-it could take up to 60 days for patient to be assessed for the first time and it is unclear if patient would be accepted by DSS for possible guardianship.  1/7: I discussed with the patient about the group home she clearly refused to go to group home-said that she does not like the area where Group home is.  She tells me that she is okay to go to [place] or [facility] however she does not qualifies for that.   1/8: I discussed again today regarding group home however she clearly refused again.  She tells me "discharge me today & I will figure it out myself where to go because I have capacity to make decisions".  DVT prophylaxis: Eliquis Code Status: DNR Family Communication:  None present at bedside.  Plan of care discussed with patient in length and she verbalized understanding and agreed with it. Disposition Plan: Unclear discharge plan. Consultants:   Nephrology  GI  Psych  Procedures:  Paracentesis Antimicrobials:   Cipro  Status is: Inpatient   Dispo: The patient is from: Home  Anticipated d/c is to: Group home?              Anticipated d/c date is: > 3 days              Patient currently is medically stable to  d/c.    Subjective: Patient seen and examined.  Complaining of feet pain.  Denies any other complaints.  No acute events overnight.  Remained afebrile.  Denies chest pain, shortness of breath, leg swelling, orthopnea or PND.  Objective: Vitals:   01/15/20 2000 01/15/20 2324 01/16/20 0429 01/16/20 0841  BP: (!) 126/93 116/85 106/79 101/69  Pulse: 86 74 71 69  Resp: 16 17 17 16   Temp: 97.8 F (36.6 C) 98 F (36.7 C) 98 F (36.7 C) 97.9 F (36.6 C)  TempSrc: Oral     SpO2: 100% 98% 100% 98%  Weight:      Height:        Intake/Output Summary (Last 24 hours) at 01/16/2020 1342 Last data filed at 01/15/2020 1415 Gross per 24 hour  Intake 120 ml  Output -  Net 120 ml   Filed Weights   01/03/20 0451 01/05/20 0344 01/09/20 0456  Weight: 96.7 kg 95.3 kg 95.7 kg    Examination:  General exam: Appears calm and comfortable, on 2 L of oxygen via nasal cannula, communicating well Respiratory system: Clear to auscultation. Respiratory effort normal.  No wheezing or rhonchi. Cardiovascular system: S1 & S2 heard, RRR. No JVD, murmurs, rubs, gallops or clicks. No pedal edema. Gastrointestinal system: Abdomen is obese, soft and nontender. No organomegaly or masses felt. Normal bowel sounds heard. Central nervous system: Alert and oriented. No focal neurological deficits. Extremities: Symmetric 5 x 5 power. Skin: No rashes, lesions or ulcers    Data Reviewed: I have personally reviewed following labs and imaging studies  CBC: Recent Labs  Lab 01/10/20 0527 01/11/20 0455 01/15/20 0707  WBC 4.4 4.4 4.4  HGB 10.0* 10.4* 10.2*  HCT 32.9* 34.7* 34.0*  MCV 72.0* 73.4* 73.4*  PLT 371 349 950   Basic Metabolic Panel: Recent Labs  Lab 01/10/20 0527 01/11/20 0455 01/15/20 0707 01/15/20 0926  NA 135 133*  --  137  K 4.0 4.0  --  3.9  CL 100 100  --  103  CO2 23 23  --  23  GLUCOSE 77 91  --  157*  BUN 57* 55*  --  56*  CREATININE 3.40* 3.29* 2.98* 2.96*  CALCIUM 9.5 9.3  --   8.9  MG  --   --   --  2.0   GFR: Estimated Creatinine Clearance: 22.4 mL/min (A) (by C-G formula based on SCr of 2.96 mg/dL (H)). Liver Function Tests: No results for input(s): AST, ALT, ALKPHOS, BILITOT, PROT, ALBUMIN in the last 168 hours. No results for input(s): LIPASE, AMYLASE in the last 168 hours. No results for input(s): AMMONIA in the last 168 hours. Coagulation Profile: No results for input(s): INR, PROTIME in the last 168 hours. Cardiac Enzymes: No results for input(s): CKTOTAL, CKMB, CKMBINDEX, TROPONINI in the last 168 hours. BNP (last 3 results) No results for input(s): PROBNP in the last 8760 hours. HbA1C: No results for input(s): HGBA1C in the last 72 hours. CBG: No results for input(s): GLUCAP in the last 168 hours. Lipid Profile: No results for input(s): CHOL, HDL, LDLCALC, TRIG, CHOLHDL, LDLDIRECT in the last 72 hours. Thyroid Function Tests: No results for input(s): TSH, T4TOTAL, FREET4, T3FREE, THYROIDAB in the last 72 hours.  Anemia Panel: No results for input(s): VITAMINB12, FOLATE, FERRITIN, TIBC, IRON, RETICCTPCT in the last 72 hours. Sepsis Labs: No results for input(s): PROCALCITON, LATICACIDVEN in the last 168 hours.  Recent Results (from the past 240 hour(s))  Resp Panel by RT-PCR (Flu A&B, Covid) Nasopharyngeal Swab     Status: None   Collection Time: 01/10/20  4:44 PM   Specimen: Nasopharyngeal Swab; Nasopharyngeal(NP) swabs in vial transport medium  Result Value Ref Range Status   SARS Coronavirus 2 by RT PCR NEGATIVE NEGATIVE Final    Comment: (NOTE) SARS-CoV-2 target nucleic acids are NOT DETECTED.  The SARS-CoV-2 RNA is generally detectable in upper respiratory specimens during the acute phase of infection. The lowest concentration of SARS-CoV-2 viral copies this assay can detect is 138 copies/mL. A negative result does not preclude SARS-Cov-2 infection and should not be used as the sole basis for treatment or other patient management  decisions. A negative result may occur with  improper specimen collection/handling, submission of specimen other than nasopharyngeal swab, presence of viral mutation(s) within the areas targeted by this assay, and inadequate number of viral copies(<138 copies/mL). A negative result must be combined with clinical observations, patient history, and epidemiological information. The expected result is Negative.  Fact Sheet for Patients:  EntrepreneurPulse.com.au  Fact Sheet for Healthcare Providers:  IncredibleEmployment.be  This test is no t yet approved or cleared by the [place] FDA and  has been authorized for detection and/or diagnosis of SARS-CoV-2 by FDA under an Emergency Use Authorization (EUA). This EUA will remain  in effect (meaning this test can be used) for the duration of the COVID-19 declaration under Section 564(b)(1) of the Act, 21 U.S.C.section 360bbb-3(b)(1), unless the authorization is terminated  or revoked sooner.       Influenza A by PCR NEGATIVE NEGATIVE Final   Influenza B by PCR NEGATIVE NEGATIVE Final    Comment: (NOTE) The Xpert Xpress SARS-CoV-2/FLU/RSV plus assay is intended as an aid in the diagnosis of influenza from Nasopharyngeal swab specimens and should not be used as a sole basis for treatment. Nasal washings and aspirates are unacceptable for Xpert Xpress SARS-CoV-2/FLU/RSV testing.  Fact Sheet for Patients: EntrepreneurPulse.com.au  Fact Sheet for Healthcare Providers: IncredibleEmployment.be  This test is not yet approved or cleared by the [place] FDA and has been authorized for detection and/or diagnosis of SARS-CoV-2 by FDA under an Emergency Use Authorization (EUA). This EUA will remain in effect (meaning this test can be used) for the duration of the COVID-19 declaration under Section 564(b)(1) of the Act, 21 U.S.C. section 360bbb-3(b)(1), unless the  authorization is terminated or revoked.  Performed at [facility], [address], [place],  [address]       Radiology Studies: No results found.  Scheduled Meds: . apixaban  5 mg Oral BID  . bumetanide  2 mg Oral BID  . ciprofloxacin  500 mg Oral Q breakfast  . epoetin (EPOGEN/PROCRIT) injection  20,000 Units Subcutaneous Weekly  . fluticasone  2 spray Each Nare Daily  . guaiFENesin  600 mg Oral TID  . iron polysaccharides  150 mg Oral Daily  . ketotifen  1 drop Both Eyes BID  . loratadine  10 mg Oral Daily  . mouth rinse  15 mL Mouth Rinse BID  . melatonin  2.5 mg Oral QHS  . midodrine  10 mg Oral TID WC  . multivitamin  1 tablet Oral QHS  . pantoprazole  40 mg Oral BID  . polyethylene  glycol  17 g Oral Daily  . spironolactone  25 mg Oral Daily   Continuous Infusions:   LOS: 89 days   Time spent: 35 minutes.   Mckinley Jewel, MD [facility]  If 7PM-7AM, please contact night-coverage [email or web address] 01/16/2020, 1:42 PM

## 2020-01-17 DIAGNOSIS — K652 Spontaneous bacterial peritonitis: Secondary | ICD-10-CM | POA: Diagnosis not present

## 2020-01-17 NOTE — Progress Notes (Signed)
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  After admission to the hospital, she was diagnosed with acute on chronic combinedsystolic and diastolic congestive heart failurewith ejection fraction 25 to 30%. She was not responding to IV Lasix. She was seen by nephrology, temporary dialysis catheter was performed and she was dialyzed on 10/17 and 10/18.Patient also complaining of abdominal pain, she had a paracentesis, study showed possible spontaneous peritonitis, she was treated with Rocephin and Flagyl.  Patient had a worsening respiratory status on 10/17, was diagnosed with aspiration pneumonia. She was treated with Rocephin, Zithromax and Flagyl. Patient also had abdominal pain and a CT scan suspect ischemia, but seen by general surgery on 10/19, no need for surgery.  Assessment & Plan:   Spontaneous bacterial peritonitis:  -Status post paracentesis on 01/11/2020 -Continue cipro for ppx   Acute on chronic combined systolic and diastolic congestive heart failure. Acute hypoxemic respiratory failure. -Echo from 06/22/2019 showed ejection fraction of 25 to 30% with grade 3 diastolic dysfunction. She was dialyzed x2, but now getting diuresed. Pt reported does not want to get dialysis again. -Continue on Aldactone and Bumex.  Monitor INO's. -Continue on nocturnal oxygen as needed.  Liver cirrhosis with ascites:Noted. GI consulted previously in hospitalization (see prior notes). -Continue cipro for SBP  ppx  Hypotension: Remained stable.  Continue midodrine 10 mg TID.    Acute renal failure on chronic kidney disease stage IIIb: -Patient refused for dialysis.  Creatinine: 3.29, GFR: 15.  Monitor kidney function closely.  Hyponatremia: Mild.  -Improved from [phone].  Continue to monitor.  She is asymptomatic.  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  H&H is stable between 9-10.  -cont iron supplement -No indication of transfusion at this time.  GERD: Continue PPI   Chronic constipation: cont. MiraLAX.  Disposition: Patient no longer meets criteria for SNF.  No family at home to discharge to.  Regarding guardianship-it could take up to 60 days for patient to be assessed for the first time and it is unclear if patient would be accepted by DSS for possible guardianship.  1/7: I discussed with the patient about the group home she clearly refused to go to group home-said that she does not like the area where Group home is.  She tells me that she is okay to go to [place] or [facility] however she does not qualifies for that.   1/8: I discussed again today regarding group home however she clearly refused again.  She tells me "discharge me today & I will figure it out myself where to go because I have capacity to make decisions".  1/9: Refused to go to group home again.  I will talk to psychiatry to reassess patient for decision-making capacity.  DVT prophylaxis: Eliquis Code Status: DNR Family Communication:  None present at bedside.  Plan of care discussed with patient in length and she verbalized understanding and agreed with it. Disposition Plan: Unclear discharge plan. Consultants:   Nephrology  GI  Psych  Procedures:  Paracentesis Antimicrobials:  Cipro  Status is: Inpatient   Dispo: The patient is from: Home              Anticipated d/c is to: Group home?               Anticipated d/c date is: > 3 days              Patient currently is medically stable to d/c.    Subjective: Patient seen and examined.  Sitting comfortably.  Tells me that she had very good night sleep last night.  She feels fresh and good this morning.  No new complaints.  Objective: Vitals:   01/16/20 2132 01/17/20 0045 01/17/20 0415 01/17/20 0802  BP: 114/90 118/88 114/90 (!) 111/92  Pulse: 72 74 76 79  Resp: 16 16 16 18   Temp: 98.1 F (36.7 C) 98.2 F (36.8 C) 97.9 F (36.6 C) 97.9 F (36.6 C)  TempSrc:  Oral Oral   SpO2: 100% 100% 100% 92%  Weight:      Height:        Intake/Output Summary (Last 24 hours) at 01/17/2020 1103 Last data filed at 01/17/2020 1031 Gross per 24 hour  Intake 0 ml  Output --  Net 0 ml   Filed Weights   01/03/20 0451 01/05/20 0344 01/09/20 0456  Weight: 96.7 kg 95.3 kg 95.7 kg    Examination:  General exam: Appears calm and comfortable, on room air, communicating well Respiratory system: Clear to auscultation. Respiratory effort normal.  No wheezing or rhonchi. Cardiovascular system: S1 & S2 heard, RRR. No JVD, murmurs, rubs, gallops or clicks. No pedal edema. Gastrointestinal system: Abdomen is obese, soft and nontender. No organomegaly or masses felt. Normal bowel sounds heard. Central nervous system: Alert and oriented. No focal neurological deficits. Extremities: Symmetric 5 x 5 power. Skin: No rashes, lesions or ulcers    Data Reviewed: I have personally reviewed following labs and imaging studies  CBC: Recent Labs  Lab 01/11/20 0455 01/15/20 0707  WBC 4.4 4.4  HGB 10.4* 10.2*  HCT 34.7* 34.0*  MCV 73.4* 73.4*  PLT 349 371   Basic Metabolic Panel: Recent Labs  Lab 01/11/20 0455 01/15/20 0707 01/15/20 0926  NA 133*  --  137  K 4.0  --  3.9  CL 100  --  103  CO2 23  --  23  GLUCOSE 91  --  157*  BUN 55*  --  56*  CREATININE 3.29* 2.98* 2.96*  CALCIUM 9.3  --  8.9  MG  --   --  2.0   GFR: Estimated Creatinine  Clearance: 22.4 mL/min (A) (by C-G formula based on SCr of 2.96 mg/dL (H)). Liver Function Tests: No results for input(s): AST, ALT, ALKPHOS, BILITOT, PROT, ALBUMIN in the last 168 hours. No results for input(s): LIPASE, AMYLASE in the last 168 hours. No results for input(s): AMMONIA in the last 168 hours. Coagulation Profile: No results for input(s): INR, PROTIME in the last 168 hours. Cardiac Enzymes: No results for input(s): CKTOTAL, CKMB, CKMBINDEX, TROPONINI in the last 168 hours. BNP (last 3 results) No results for input(s): PROBNP in the last 8760 hours. HbA1C: No results for input(s): HGBA1C in the last 72 hours. CBG: No results for input(s): GLUCAP in the last 168 hours. Lipid Profile: No results for input(s): CHOL, HDL, LDLCALC, TRIG, CHOLHDL, LDLDIRECT in the last 72 hours. Thyroid Function Tests: No results for input(s): TSH, T4TOTAL, FREET4, T3FREE, THYROIDAB in the last 72 hours. Anemia Panel:  No results for input(s): VITAMINB12, FOLATE, FERRITIN, TIBC, IRON, RETICCTPCT in the last 72 hours. Sepsis Labs: No results for input(s): PROCALCITON, LATICACIDVEN in the last 168 hours.  Recent Results (from the past 240 hour(s))  Resp Panel by RT-PCR (Flu A&B, Covid) Nasopharyngeal Swab     Status: None   Collection Time: 01/10/20  4:44 PM   Specimen: Nasopharyngeal Swab; Nasopharyngeal(NP) swabs in vial transport medium  Result Value Ref Range Status   SARS Coronavirus 2 by RT PCR NEGATIVE NEGATIVE Final    Comment: (NOTE) SARS-CoV-2 target nucleic acids are NOT DETECTED.  The SARS-CoV-2 RNA is generally detectable in upper respiratory specimens during the acute phase of infection. The lowest concentration of SARS-CoV-2 viral copies this assay can detect is 138 copies/mL. A negative result does not preclude SARS-Cov-2 infection and should not be used as the sole basis for treatment or other patient management decisions. A negative result may occur with  improper specimen  collection/handling, submission of specimen other than nasopharyngeal swab, presence of viral mutation(s) within the areas targeted by this assay, and inadequate number of viral copies(<138 copies/mL). A negative result must be combined with clinical observations, patient history, and epidemiological information. The expected result is Negative.  Fact Sheet for Patients:  EntrepreneurPulse.com.au  Fact Sheet for Healthcare Providers:  IncredibleEmployment.be  This test is no t yet approved or cleared by the [place] FDA and  has been authorized for detection and/or diagnosis of SARS-CoV-2 by FDA under an Emergency Use Authorization (EUA). This EUA will remain  in effect (meaning this test can be used) for the duration of the COVID-19 declaration under Section 564(b)(1) of the Act, 21 U.S.C.section 360bbb-3(b)(1), unless the authorization is terminated  or revoked sooner.       Influenza A by PCR NEGATIVE NEGATIVE Final   Influenza B by PCR NEGATIVE NEGATIVE Final    Comment: (NOTE) The Xpert Xpress SARS-CoV-2/FLU/RSV plus assay is intended as an aid in the diagnosis of influenza from Nasopharyngeal swab specimens and should not be used as a sole basis for treatment. Nasal washings and aspirates are unacceptable for Xpert Xpress SARS-CoV-2/FLU/RSV testing.  Fact Sheet for Patients: EntrepreneurPulse.com.au  Fact Sheet for Healthcare Providers: IncredibleEmployment.be  This test is not yet approved or cleared by the [place] FDA and has been authorized for detection and/or diagnosis of SARS-CoV-2 by FDA under an Emergency Use Authorization (EUA). This EUA will remain in effect (meaning this test can be used) for the duration of the COVID-19 declaration under Section 564(b)(1) of the Act, 21 U.S.C. section 360bbb-3(b)(1), unless the authorization is terminated or revoked.  Performed at [facility], [address] [place], [place] [address]       Radiology Studies: No results found.  Scheduled Meds: . apixaban  5 mg Oral BID  . bumetanide  2 mg Oral BID  . ciprofloxacin  500 mg Oral Q breakfast  . epoetin (EPOGEN/PROCRIT) injection  20,000 Units Subcutaneous Weekly  . fluticasone  2 spray Each Nare Daily  . guaiFENesin  600 mg Oral TID  . iron polysaccharides  150 mg Oral Daily  . ketotifen  1 drop Both Eyes BID  . loratadine  10 mg Oral Daily  . mouth rinse  15 mL Mouth Rinse BID  . melatonin  2.5 mg Oral QHS  . midodrine  10 mg Oral TID WC  . multivitamin  1 tablet Oral QHS  . pantoprazole  40 mg Oral BID  . polyethylene glycol  17 g Oral Daily  . spironolactone  25 mg Oral Daily   Continuous Infusions:   LOS: 90 days   Time spent: 35 minutes.   Mckinley Jewel, MD [facility]  If 7PM-7AM, please contact night-coverage [email or web address] 01/17/2020, 11:03 AM

## 2020-01-18 DIAGNOSIS — K652 Spontaneous bacterial peritonitis: Secondary | ICD-10-CM | POA: Diagnosis not present

## 2020-01-18 LAB — CBC
HCT: 34 % — ABNORMAL LOW (ref 36.0–46.0)
Hemoglobin: 10.4 g/dL — ABNORMAL LOW (ref 12.0–15.0)
MCH: 22.2 pg — ABNORMAL LOW (ref 26.0–34.0)
MCHC: 30.6 g/dL (ref 30.0–36.0)
MCV: 72.6 fL — ABNORMAL LOW (ref 80.0–100.0)
Platelets: 372 10*3/uL (ref 150–400)
RBC: 4.68 MIL/uL (ref 3.87–5.11)
RDW: 18 % — ABNORMAL HIGH (ref 11.5–15.5)
WBC: 4.9 10*3/uL (ref 4.0–10.5)
nRBC: 0 % (ref 0.0–0.2)

## 2020-01-18 NOTE — Progress Notes (Addendum)
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  After admission to the hospital, she was diagnosed with acute on chronic combinedsystolic and diastolic congestive heart failurewith ejection fraction 25 to 30%. She was not responding to IV Lasix. She was seen by nephrology, temporary dialysis catheter was performed and she was dialyzed on 10/17 and 10/18.Patient also complaining of abdominal pain, she had a paracentesis, study showed possible spontaneous peritonitis, she was treated with Rocephin and Flagyl.  Patient had a worsening respiratory status on 10/17, was diagnosed with aspiration pneumonia. She was treated with Rocephin, Zithromax and Flagyl. Patient also had abdominal pain and a CT scan suspect ischemia, but seen by general surgery on 10/19, no need for surgery.  Assessment & Plan:   Spontaneous bacterial peritonitis:  -Status post paracentesis on 01/11/2020 -Continue cipro for ppx   Acute on chronic combined systolic and diastolic congestive heart failure. Acute hypoxemic respiratory failure. -Echo from 06/22/2019 showed ejection fraction of 25 to 30% with grade 3 diastolic dysfunction. She was dialyzed x2, but now getting diuresed. Pt reported does not want to get dialysis again. -Continue on Aldactone and Bumex.  Monitor INO's. -Continue on nocturnal oxygen as needed.  Liver cirrhosis with ascites:Noted. GI consulted previously in hospitalization (see prior notes). -Continue cipro for SBP  ppx  Hypotension: Remained stable.  Continue midodrine 10 mg TID.    Acute renal failure on chronic kidney disease stage IIIb: -Patient refused for dialysis.  Creatinine: 3.29, GFR: 15.  Monitor kidney function closely.  Hyponatremia: Mild.  -Improved from [phone].  Continue to monitor.  She is asymptomatic.  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  H&H is stable between 9-10.  -cont iron supplement -No indication of transfusion at this time.  GERD: Continue PPI   Chronic constipation: cont. MiraLAX.  Sinus Congestion: with itchy throat & ear -Cont. Claritin,  Flonase & Afrin Nasal spray.  Disposition: Patient no longer meets criteria for SNF.  No family at home to discharge to.  Regarding guardianship-it could take up to 60 days for patient to be assessed for the first time and it is unclear if patient would be accepted by DSS for possible guardianship.  1/7: I discussed with the patient about the group home she clearly refused to go to group home-said that she does not like the area where Group home is.  She tells me that she is okay to go to [facility] however she does not qualifies for that.   1/8: I discussed again today regarding group home however she clearly refused again.  She tells me "discharge me today & I will figure it out myself where to go because I have capacity to make decisions".  1/9-1/10: Refused to go to group home.  I will talk to psychiatry to reassess patient for decision-making capacity.  DVT prophylaxis: Eliquis Code Status: DNR Family Communication:  None present at bedside.  Plan of care discussed with patient in length and she verbalized understanding and agreed with it. Disposition Plan: Unclear discharge  plan. Consultants:   Nephrology  GI  Psych  Procedures:  Paracentesis Antimicrobials:   Cipro  Status is:  Inpatient   Dispo: The patient is from: Home              Anticipated d/c is to: Group home?              Anticipated d/c date is: > 3 days              Patient currently is medically stable to d/c.    Subjective: Patient seen and examined.  Sitting comfortably on the bed.  Complaining of nasal congestion, itchy ears and throat.  Requested something for allergies.  No any other symptoms.  Remained afebrile.  No acute events overnight.  Objective: Vitals:   01/18/20 0100 01/18/20 0430 01/18/20 0742 01/18/20 1136  BP: 118/81 108/87 99/77 108/76  Pulse: 68 70 67 70  Resp: 17 17 17 15   Temp: (!) 97.5 F (36.4 C) 97.8 F (36.6 C) 97.9 F (36.6 C) 97.8 F (36.6 C)  TempSrc:   Oral   SpO2: 98% 95% 100% 94%  Weight:      Height:        Intake/Output Summary (Last 24 hours) at 01/18/2020 1157 Last data filed at 01/17/2020 1408 Gross per 24 hour  Intake 120 ml  Output --  Net 120 ml   Filed Weights   01/03/20 0451 01/05/20 0344 01/09/20 0456  Weight: 96.7 kg 95.3 kg 95.7 kg    Examination:  General exam: Appears calm and comfortable, on room air, communicating well Respiratory system: Clear to auscultation. Respiratory effort normal.  No wheezing or rhonchi. Cardiovascular system: S1 & S2 heard, RRR. No JVD, murmurs, rubs, gallops or clicks. No pedal edema. Gastrointestinal system: Abdomen is obese, soft and nontender. No organomegaly or masses felt. Normal bowel sounds heard. Central nervous system: Alert and oriented. No focal neurological deficits. Extremities: Symmetric 5 x 5 power. Skin: No rashes, lesions or ulcers    Data Reviewed: I have personally reviewed following labs and imaging studies  CBC: Recent Labs  Lab 01/15/20 0707 01/18/20 0421  WBC 4.4 4.9  HGB 10.2* 10.4*  HCT 34.0* 34.0*  MCV 73.4* 72.6*  PLT 332 295   Basic Metabolic Panel: Recent Labs  Lab 01/15/20 0707 01/15/20 0926  NA  --  137  K  --  3.9  CL  --  103  CO2  --  23  GLUCOSE   --  157*  BUN  --  56*  CREATININE 2.98* 2.96*  CALCIUM  --  8.9  MG  --  2.0   GFR: Estimated Creatinine Clearance: 22.4 mL/min (A) (by C-G formula based on SCr of 2.96 mg/dL (H)). Liver Function Tests: No results for input(s): AST, ALT, ALKPHOS, BILITOT, PROT, ALBUMIN in the last 168 hours. No results for input(s): LIPASE, AMYLASE in the last 168 hours. No results for input(s): AMMONIA in the last 168 hours. Coagulation Profile: No results for input(s): INR, PROTIME in the last 168 hours. Cardiac Enzymes: No results for input(s): CKTOTAL, CKMB, CKMBINDEX, TROPONINI in the last 168 hours. BNP (last 3 results) No results for input(s): PROBNP in the last 8760 hours. HbA1C: No results for input(s): HGBA1C in the last 72 hours. CBG: No results for input(s): GLUCAP in the last 168 hours. Lipid Profile: No results for input(s): CHOL, HDL, LDLCALC, TRIG, CHOLHDL, LDLDIRECT in the last 72 hours. Thyroid Function Tests: No results for input(s): TSH, T4TOTAL, FREET4,  T3FREE, THYROIDAB in the last 72 hours. Anemia Panel: No results for input(s): VITAMINB12, FOLATE, FERRITIN, TIBC, IRON, RETICCTPCT in the last 72 hours. Sepsis Labs: No results for input(s): PROCALCITON, LATICACIDVEN in the last 168 hours.  Recent Results (from the past 240 hour(s))  Resp Panel by RT-PCR (Flu A&B, Covid) Nasopharyngeal Swab     Status: None   Collection Time: 01/10/20  4:44 PM   Specimen: Nasopharyngeal Swab; Nasopharyngeal(NP) swabs in vial transport medium  Result Value Ref Range Status   SARS Coronavirus 2 by RT PCR NEGATIVE NEGATIVE Final    Comment: (NOTE) SARS-CoV-2 target nucleic acids are NOT DETECTED.  The SARS-CoV-2 RNA is generally detectable in upper respiratory specimens during the acute phase of infection. The lowest concentration of SARS-CoV-2 viral copies this assay can detect is 138 copies/mL. A negative result does not preclude SARS-Cov-2 infection and should not be used as the sole  basis for treatment or other patient management decisions. A negative result may occur with  improper specimen collection/handling, submission of specimen other than nasopharyngeal swab, presence of viral mutation(s) within the areas targeted by this assay, and inadequate number of viral copies(<138 copies/mL). A negative result must be combined with clinical observations, patient history, and epidemiological information. The expected result is Negative.  Fact Sheet for Patients:  EntrepreneurPulse.com.au  Fact Sheet for Healthcare Providers:  IncredibleEmployment.be  This test is no t yet approved or cleared by the [place] FDA and  has been authorized for detection and/or diagnosis of SARS-CoV-2 by FDA under an Emergency Use Authorization (EUA). This EUA will remain  in effect (meaning this test can be used) for the duration of the COVID-19 declaration under Section 564(b)(1) of the Act, 21 U.S.C.section 360bbb-3(b)(1), unless the authorization is terminated  or revoked sooner.       Influenza A by PCR NEGATIVE NEGATIVE Final   Influenza B by PCR NEGATIVE NEGATIVE Final    Comment: (NOTE) The Xpert Xpress SARS-CoV-2/FLU/RSV plus assay is intended as an aid in the diagnosis of influenza from Nasopharyngeal swab specimens and should not be used as a sole basis for treatment. Nasal washings and aspirates are unacceptable for Xpert Xpress SARS-CoV-2/FLU/RSV testing.  Fact Sheet for Patients: EntrepreneurPulse.com.au  Fact Sheet for Healthcare Providers: IncredibleEmployment.be  This test is not yet approved or cleared by the [place] FDA and has been authorized for detection and/or diagnosis of SARS-CoV-2 by FDA under an Emergency Use Authorization (EUA). This EUA will remain in effect (meaning this test can be used) for the duration of the COVID-19 declaration under Section 564(b)(1) of the Act,  21 U.S.C. section 360bbb-3(b)(1), unless the authorization is terminated or revoked.  Performed at [facility], [address], [place], [place] [address]       Radiology Studies: No results found.  Scheduled Meds: . apixaban  5 mg Oral BID  . bumetanide  2 mg Oral BID  . ciprofloxacin  500 mg Oral Q breakfast  . epoetin (EPOGEN/PROCRIT) injection  20,000 Units Subcutaneous Weekly  . fluticasone  2 spray Each Nare Daily  . guaiFENesin  600 mg Oral TID  . iron polysaccharides  150 mg Oral Daily  . ketotifen  1 drop Both Eyes BID  . loratadine  10 mg Oral Daily  . mouth rinse  15 mL Mouth Rinse BID  . melatonin  2.5 mg Oral QHS  . midodrine  10 mg Oral TID WC  . multivitamin  1 tablet Oral QHS  . pantoprazole  40 mg Oral BID  . polyethylene glycol  17 g Oral Daily  . spironolactone  25 mg Oral Daily   Continuous Infusions:   LOS: 91 days   Time spent: 35 minutes.   Mckinley Jewel, MD [facility]  If 7PM-7AM, please contact night-coverage [email or web address] 01/18/2020, 11:57 AM

## 2020-01-18 NOTE — TOC Progression Note (Signed)
Transition of Care [facility]) - Progression Note    Patient Details  Name: Katelyn Lane MRN: [id number] Date of Birth: 01-23-1955  [facility]) CM/SW [place], RN Phone Number: 01/18/2020, 3:41 PM  Clinical Narrative:    Secure email sent to Rodolph Bong supervisor requesting update on guardianship review and asking for any possible resources to assist if patient is discharged back into the community.         Expected Discharge Plan and Services           Expected Discharge Date: 01/08/20                                     Social Determinants of Health (SDOH) Interventions    Readmission Risk Interventions Readmission Risk Prevention Plan 09/02/2019 08/14/2019 07/27/2019  Transportation Screening Complete Complete Complete  Medication Review Press photographer) Complete Referral to Pharmacy -  PCP or Specialist appointment within 3-5 days of discharge Complete Complete Complete  HRI or Home Care Consult - Complete Complete  SW Recovery Care/Counseling Consult - Complete Complete  Palliative Care Screening Not Applicable Not Applicable Not Applicable  Skilled Nursing Facility Complete Not Applicable Not Applicable  Some recent data might be hidden

## 2020-01-18 NOTE — Consult Note (Signed)
[facility] Consult   Reason for Consult: Follow-up consult for this 65 year old woman who has been in the hospital for an extraordinarily long time Referring Physician:  Pahwani Patient Identification: Katelyn Lane MRN:  [id number] Principal Diagnosis: Spontaneous bacterial peritonitis ([place]) Diagnosis:  Principal Problem:   Spontaneous bacterial peritonitis ([place]) Active Problems:   AKI (acute kidney injury) ([place])   Crack cocaine use   Acute respiratory failure with hypoxia (HCC)   LV (left ventricular) mural thrombus   Chronic kidney disease (CKD) stage G3b/A1, moderately decreased glomerular filtration rate (GFR) between 30-44 mL/min/1.73 square meter and albuminuria creatinine ratio less than 30 mg/g (HCC)   Nonsustained ventricular tachycardia (HCC)   Acute CHF (congestive heart failure) (HCC)   Cocaine abuse ([place])   Peripheral edema   Cirrhosis of liver (HCC)   Dementia (HCC)   Aspiration pneumonia of both lower lobes due to gastric secretions (HCC)   Acute diastolic (congestive) heart failure (HCC)   Cardiorenal disease   Goals of care, counseling/discussion   Palliative care by specialist   Ascites due to alcoholic cirrhosis ([place])   Total Time spent with patient: 30 minutes  Subjective:   Katelyn Lane is a 65 y.o. female patient admitted with "I want to get out of here".  HPI: Patient seen chart reviewed. This is a 65 year old woman with multiple medical problems. She has been in the hospital for an extremely long time not for any acute medical reason but because of an inability to locate a safe disposition for her. I had been asked at least twice in the past to give my opinion about whether she had "capacity to make decisions" regarding her discharge plan. On interview today I find the patient essentially unchanged from the last time I spoke with her. She is alert and oriented. She is happy to engage in conversation. Her affect is a bit labile and  unpredictable. She is self consciously grumpy. She gets distracted easily and has a hard time telling even a simple story in a straightforward manner. Talking about plans she is frequently jumping to conclusions or making vague assumptions about the future but has not apparently made much effort to actually nail down a real plan. She denies suicidal or homicidal ideation. She does not appear to be psychotic. She does tell me that somebody had offered her to go to [facility] [place] [facility]. She tells me she does not think she wants to do [place]  Past Psychiatric History: Past history of several evaluations of the same sort as well as a substance abuse history  Risk to Self:   Risk to Others:   Prior Inpatient Therapy:   Prior Outpatient Therapy:    Past Medical History:  Past Medical History:  Diagnosis Date  . CHF (congestive heart failure) ([place])   . COVID-19   . Hyperlipidemia   . Hypertension   . Renal disorder     Past Surgical History:  Procedure Laterality Date  . DIALYSIS/PERMA CATHETER INSERTION N/A 10/28/2019   Procedure: DIALYSIS/PERMA CATHETER INSERTION;  Surgeon: Katha Cabal, MD;  Location: [facility];  Service: Cardiovascular;  Laterality: N/A;  . DIALYSIS/PERMA CATHETER REMOVAL N/A 11/13/2019   Procedure: DIALYSIS/PERMA CATHETER REMOVAL;  Surgeon: Algernon Huxley, MD;  Location: [facility];  Service: Cardiovascular;  Laterality: N/A;  . RIGHT/LEFT HEART CATH AND CORONARY ANGIOGRAPHY N/A 12/30/2018   Procedure: RIGHT/LEFT HEART CATH AND CORONARY ANGIOGRAPHY;  Surgeon: Corey Skains, MD;  Location: [facility];  Service: Cardiovascular;  Laterality: N/A;   Family History:  Family History  Problem Relation Age of Onset  . Breast cancer Neg Hx    Family Psychiatric  History: See previous Social History:  Social History   Substance and Sexual Activity  Alcohol Use No  . Alcohol/week: 0.0  standard drinks     Social History   Substance and Sexual Activity  Drug Use Yes  . Types: Cocaine   Comment: 3-4 days ago    Social History   Socioeconomic History  . Marital status: Single    Spouse name: Not on file  . Number of children: Not on file  . Years of education: Not on file  . Highest education level: Not on file  Occupational History  . Not on file  Tobacco Use  . Smoking status: Current Every Day Smoker  . Smokeless tobacco: Never Used  Substance and Sexual Activity  . Alcohol use: No    Alcohol/week: 0.0 standard drinks  . Drug use: Yes    Types: Cocaine    Comment: 3-4 days ago  . Sexual activity: Not on file  Other Topics Concern  . Not on file  Social History Narrative  . Not on file   Social Determinants of Health   Financial Resource Strain: Not on file  Food Insecurity: Not on file  Transportation Needs: Not on file  Physical Activity: Not on file  Stress: Not on file  Social Connections: Not on file   Additional Social History:    Allergies:   Allergies  Allergen Reactions  . Penicillins Hives, Rash and Other (See Comments)    Did it involve swelling of the face/tongue/throat, SOB, or low BP? No Did it involve sudden or severe rash/hives, skin peeling, or any reaction on the inside of your mouth or nose? Yes Did you need to seek medical attention at a hospital or doctor's office? Yes When did it last happen? >25 years If all above answers are "NO", may proceed with cephalosporin use.   . Ace Inhibitors Nausea And Vomiting    Labs:  Results for orders placed or performed during the hospital encounter of 10/19/19 (from the past 48 hour(s))  CBC     Status: Abnormal   Collection Time: 01/18/20  4:21 AM  Result Value Ref Range   WBC 4.9 4.0 - 10.5 K/uL   RBC 4.68 3.87 - 5.11 MIL/uL   Hemoglobin 10.4 (L) 12.0 - 15.0 g/dL   HCT 34.0 (L) 36.0 - 46.0 %   MCV 72.6 (L) 80.0 - 100.0 fL   MCH 22.2 (L) 26.0 - 34.0 pg   MCHC 30.6  30.0 - 36.0 g/dL   RDW 18.0 (H) 11.5 - 15.5 %   Platelets 372 150 - 400 K/uL   nRBC 0.0 0.0 - 0.2 %    Comment: Performed at [facility], [address], [place], [place] [address]    Current Facility-Administered Medications  Medication Dose Route Frequency Provider Last Rate Last Admin  . acetaminophen (TYLENOL) tablet 500 mg  500 mg Oral Q6H PRN Elodia Florence., MD   500 mg at 01/18/20 1407  . albuterol (PROVENTIL) (2.5 MG/3ML) 0.083% nebulizer solution 2.5 mg  2.5 mg Nebulization Q4H PRN Sharen Hones, MD   2.5 mg at 11/20/19 0801  . alum & mag hydroxide-simeth (MAALOX/MYLANTA) 200-200-20 MG/5ML suspension 30 mL  30 mL Oral Q4H PRN Schnier, Dolores Lory,  MD   30 mL at 12/27/19 2314   And  . lidocaine (XYLOCAINE) 2 % viscous mouth solution 15 mL  15 mL Oral Q4H PRN Schnier, Dolores Lory, MD   15 mL at 10/23/19 2205  . apixaban (ELIQUIS) tablet 5 mg  5 mg Oral BID [name], MD   5 mg at 01/18/20 2130  . bisacodyl (DULCOLAX) suppository 10 mg  10 mg Rectal Daily PRN Schnier, Dolores Lory, MD   10 mg at 10/24/19 0459  . bumetanide (BUMEX) tablet 2 mg  2 mg Oral BID [name], Manpreet S, MD   2 mg at 01/18/20 1726  . calcium carbonate (TUMS - dosed in mg elemental calcium) chewable tablet 200 mg of elemental calcium  1 tablet Oral TID PRN Sharen Hones, MD   200 mg of elemental calcium at 11/01/19 0949  . ciprofloxacin (CIPRO) tablet 500 mg  500 mg Oral Q breakfast Elodia Florence., MD   500 mg at 01/18/20 0949  . epoetin alfa (EPOGEN) injection 20,000 Units  20,000 Units Subcutaneous Weekly Holley Raring, Munsoor, MD   20,000 Units at 01/07/20 1353  . fluticasone (FLONASE) 50 MCG/ACT nasal spray 2 spray  2 spray Each Nare Daily Sharion Settler, NP   2 spray at 01/18/20 0950  . guaiFENesin (MUCINEX) 12 hr tablet 600 mg  600 mg Oral TID [name], MD   600 mg at 01/18/20 1727  . hydrocortisone cream 0.5 %   Topical TID PRN Max Sane, MD   1 application at 86/57/84  2118  . hydrOXYzine (ATARAX/VISTARIL) tablet 50 mg  50 mg Oral Q6H PRN Loghan Kurtzman, Madie Reno, MD   50 mg at 01/17/20 1435  . iron polysaccharides (NIFEREX) capsule 150 mg  150 mg Oral Daily Sharen Hones, MD   150 mg at 01/17/20 0859  . ketotifen (ZADITOR) 0.025 % ophthalmic solution 1 drop  1 drop Both Eyes BID Max Sane, MD   1 drop at 01/18/20 0951  . loperamide (IMODIUM) capsule 2 mg  2 mg Oral PRN Sharen Hones, MD   2 mg at 11/09/19 0853  . loratadine (CLARITIN) tablet 10 mg  10 mg Oral Daily [name], MD   10 mg at 01/18/20 0951  . MEDLINE mouth rinse  15 mL Mouth Rinse BID Schnier, Dolores Lory, MD   15 mL at 01/18/20 0951  . melatonin tablet 2.5 mg  2.5 mg Oral QHS Sharen Hones, MD   2.5 mg at 01/17/20 2221  . midodrine (PROAMATINE) tablet 10 mg  10 mg Oral TID WC Swayze, Ava, DO   10 mg at 01/18/20 1726  . multivitamin (RENA-VIT) tablet 1 tablet  1 tablet Oral QHS Swayze, Ava, DO   1 tablet at 01/17/20 2221  . ondansetron (ZOFRAN) injection 4 mg  4 mg Intravenous Q6H PRN Schnier, Dolores Lory, MD      . oxyCODONE-acetaminophen (PERCOCET/ROXICET) 5-325 MG per tablet 1 tablet  1 tablet Oral Q4H PRN Sharen Hones, MD   1 tablet at 12/11/19 2020  . pantoprazole (PROTONIX) EC tablet 40 mg  40 mg Oral BID Sharen Hones, MD   40 mg at 01/18/20 0949  . polyethylene glycol (MIRALAX / GLYCOLAX) packet 17 g  17 g Oral Daily Pahwani, Rinka R, MD   17 g at 01/18/20 1241  . polyvinyl alcohol (LIQUIFILM TEARS) 1.4 % ophthalmic solution 1 drop  1 drop Both Eyes PRN Sharion Settler, NP   1 drop at 01/12/20 2219  . spironolactone (  ALDACTONE) tablet 25 mg  25 mg Oral Daily Kolluru, Sarath, MD   25 mg at 01/18/20 0949  . traMADol (ULTRAM) tablet 50 mg  50 mg Oral Q12H PRN Pahwani, Rinka R, MD   50 mg at 01/17/20 0902  . white petrolatum (VASELINE) gel   Topical PRN Pahwani, Michell Heinrich, MD   1 application at 23/53/61 1727    Musculoskeletal: Strength & Muscle Tone: within normal limits Gait & Station:  normal Patient leans: N/A  Psychiatric Specialty Exam: Physical Exam Vitals and nursing note reviewed.  Constitutional:      Appearance: She is well-developed and well-nourished.  HENT:     Head: Normocephalic and atraumatic.  Eyes:     Conjunctiva/sclera: Conjunctivae normal.     Pupils: Pupils are equal, round, and reactive to light.  Cardiovascular:     Heart sounds: Normal heart sounds.  Pulmonary:     Effort: Pulmonary effort is normal.  Abdominal:     Palpations: Abdomen is soft.  Musculoskeletal:        General: Normal range of motion.     Cervical back: Normal range of motion.  Skin:    General: Skin is warm and dry.  Neurological:     General: No focal deficit present.     Mental Status: She is alert.  Psychiatric:        Attention and Perception: She is inattentive.        Mood and Affect: Affect is labile.        Speech: Speech is tangential.        Behavior: Behavior is agitated. Behavior is not aggressive.        Thought Content: Thought content does not include homicidal or suicidal ideation.        Cognition and Memory: Cognition is impaired.        Judgment: Judgment is impulsive.     Review of Systems  Constitutional: Negative.   HENT: Negative.   Eyes: Negative.   Respiratory: Negative.   Cardiovascular: Negative.   Gastrointestinal: Negative.   Musculoskeletal: Negative.   Skin: Negative.   Neurological: Negative.   Psychiatric/Behavioral: Positive for dysphoric mood.    Blood pressure 122/87, pulse 69, temperature 97.8 F (36.6 C), resp. rate 17, height 5\' 6"  (1.676 m), weight 95.7 kg, SpO2 93 %.Body mass index is 34.04 kg/m.  General Appearance: Casual  Eye Contact:  Fair  Speech:  Garbled  Volume:  Increased  Mood:  Irritable  Affect:  Labile  Thought Process:  Disorganized  Orientation:  Full (Time, Place, and Person)  Thought Content:  Tangential  Suicidal Thoughts:  No  Homicidal Thoughts:  No  Memory:  Immediate;   Fair Recent;    Poor Remote;   Fair  Judgement:  Impaired  Insight:  Shallow  Psychomotor Activity:  Restlessness  Concentration:  Concentration: Poor  Recall:  [employer] of Knowledge:  Fair  Language:  Fair  Akathisia:  No  Handed:  Right  AIMS (if indicated):     Assets:  Financial Resources/Insurance Resilience  ADL's:  Impaired  Cognition:  Impaired,  Mild  Sleep:        Treatment Plan Summary: Plan 65 year old woman with multiple medical problems and a substance abuse history. The question once again is whether she has the capacity to make decisions about her discharge. The problems with this are the same as before, that there is no specific plan in place to ask her about and nothing specific to  compare it to both of which would be necessary to really judge her capacity to decide. Judgments about capacity really must be made in the context of a very specific decision that can be defined so that we can see if the patient is able to consider it. What I do have an opinion about is that the patient does not appear to have the capacity to take care of herself independently. She is unable to articulate a plan that makes any sense or takes into account any circumstances that might arise. I also note that she has demonstrated at least once before a willingness to get up and attempt to walk out of the hospital when she had no possible plan whatsoever. However, when it comes to making a decision it is possible that she might be able to do that it just needs to be clearly defined. For example if the patient were told by a united treatment team including the attending physician and the [facility] team that she had to make a choice between either going to such and such group home or being discharged to a homeless shelter (and I am not sure what homeless shelter that would be. [facility] has been largely closed to new admissions for a long time and is very difficult to access and would probably not be  appropriate for her.) Then if it were clear that those were her 2 choices we could judge whether she was able to understand the difference. This brings up another concern which is that the [facility] team and the attending physicians do not seem to be entirely in agreement about the proper disposition. TOC nursing today tells me that if I were to say the patient has capacity they would have her discharged to a homeless shelter. The attending physician on the other hand says that they would not agree to do that. The whole team needs to agree on what the choices are for the patient otherwise she is able to keep falling back on the default of just staying here in the hospital forever. I am sorry if this is not terribly helpful. If there must be a bottom line about "capacity" I would say that my opinion is unchanged from my last evaluation.  Disposition: No evidence of imminent risk to self or others at present.   Patient does not meet criteria for psychiatric inpatient admission. Supportive therapy provided about ongoing stressors. Discussed crisis plan, support from social network, calling 911, coming to the Emergency Department, and calling Suicide Hotline.  Alethia Berthold, MD 01/18/2020 5:46 PM

## 2020-01-19 DIAGNOSIS — K652 Spontaneous bacterial peritonitis: Secondary | ICD-10-CM | POA: Diagnosis not present

## 2020-01-19 NOTE — Plan of Care (Signed)
  Problem: Education: Goal: Knowledge of General Education information will improve Description: Including pain rating scale, medication(s)/side effects and non-pharmacologic comfort measures Outcome: Progressing   Problem: Health Behavior/Discharge Planning: Goal: Ability to manage health-related needs will improve Outcome: Progressing   Problem: Clinical Measurements: Goal: Ability to maintain clinical measurements within normal limits will improve Outcome: Progressing Goal: Will remain free from infection Outcome: Progressing Goal: Diagnostic test results will improve Outcome: Progressing Goal: Respiratory complications will improve Outcome: Progressing Goal: Cardiovascular complication will be avoided Outcome: Progressing   Problem: Activity: Goal: Risk for activity intolerance will decrease Outcome: Progressing   Problem: Nutrition: Goal: Adequate nutrition will be maintained Outcome: Progressing   Problem: Coping: Goal: Level of anxiety will decrease Outcome: Progressing   Problem: Elimination: Goal: Will not experience complications related to bowel motility Outcome: Progressing Goal: Will not experience complications related to urinary retention Outcome: Progressing   Problem: Pain Managment: Goal: General experience of comfort will improve Outcome: Progressing   Problem: Safety: Goal: Ability to remain free from injury will improve Outcome: Progressing   Problem: Skin Integrity: Goal: Risk for impaired skin integrity will decrease Outcome: Progressing   Problem: Education: Goal: Ability to demonstrate management of disease process will improve Outcome: Progressing Goal: Ability to verbalize understanding of medication therapies will improve Outcome: Progressing Goal: Individualized Educational Video(s) Outcome: Progressing   Problem: Activity: Goal: Capacity to carry out activities will improve Outcome: Progressing   Problem: Cardiac: Goal:  Ability to achieve and maintain adequate cardiopulmonary perfusion will improve Outcome: Progressing   Problem: Education: Goal: Knowledge of disease and its progression will improve Outcome: Progressing   Problem: Fluid Volume: Goal: Compliance with measures to maintain balanced fluid volume will improve Outcome: Progressing   Problem: Health Behavior/Discharge Planning: Goal: Ability to manage health-related needs will improve Outcome: Progressing   Problem: Nutritional: Goal: Ability to make healthy dietary choices will improve Outcome: Progressing   Problem: Clinical Measurements: Goal: Complications related to the disease process, condition or treatment will be avoided or minimized Outcome: Progressing

## 2020-01-19 NOTE — Progress Notes (Signed)
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  After admission to the hospital, she was diagnosed with acute on chronic combinedsystolic and diastolic congestive heart failurewith ejection fraction 25 to 30%. She was not responding to IV Lasix. She was seen by nephrology, temporary dialysis catheter was performed and she was dialyzed on 10/17 and 10/18.Patient also complaining of abdominal pain, she had a paracentesis, study showed possible spontaneous peritonitis, she was treated with Rocephin and Flagyl.  Patient had a worsening respiratory status on 10/17, was diagnosed with aspiration pneumonia. She was treated with Rocephin, Zithromax and Flagyl. Patient also had abdominal pain and a CT scan suspect ischemia, but seen by general surgery on 10/19, no need for surgery.  Assessment & Plan:   Spontaneous bacterial peritonitis:  -Status post paracentesis on 01/11/2020 -Continue cipro for ppx   Acute on chronic combined systolic and diastolic congestive heart failure. Acute hypoxemic respiratory failure. -Echo from 06/22/2019 showed ejection fraction of 25 to 30% with grade 3 diastolic dysfunction. She was dialyzed x2, but now getting diuresed. Pt reported does not want to get dialysis again. -Continue on Aldactone and Bumex.  Monitor INO's. -Continue on nocturnal oxygen as needed.  Liver cirrhosis with ascites:Noted. GI consulted previously in hospitalization (see prior notes). -Continue cipro for SBP  ppx  Hypotension: Low this am-  Continue midodrine 10 mg TID.   -Cont. To monitor BP closely.  Acute renal failure on chronic kidney disease stage IIIb: -Patient refused for dialysis.  Creatinine: 3.29, GFR: 15.  Monitor kidney function closely.  Hyponatremia: Mild.  -Improved from [phone].  Continue to monitor.  She is asymptomatic.  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  H&H is stable between 9-10.  -cont iron supplement -No indication of transfusion at this time.  GERD: Continue PPI   Chronic constipation: cont. MiraLAX.  Sinus Congestion: with itchy throat & ear -Cont. Claritin,  Flonase & Afrin Nasal spray.  Disposition: Patient no longer meets criteria for SNF.  No family at home to discharge to.  Regarding guardianship-it could take up to 60 days for patient to be assessed for the first time and it is unclear if patient would be accepted by DSS for possible guardianship.  1/7: I discussed with the patient about the group home she clearly refused to go to group home-said that she does not like the area where Group home is.  She tells me that she is okay to go to [place] or [facility] however she does not qualifies for that.   1/8: I discussed again today regarding group home however she clearly refused again.  She tells me "discharge me today & I will figure it out myself where to go because I have capacity to make decisions".  No safe discharge plan yet-I appreciate psychiatry evaluation & recommendations.   DVT prophylaxis: Eliquis Code Status: DNR Family Communication:  None present at bedside.  Plan of care discussed with patient in length and she verbalized understanding and agreed with it. Disposition Plan: Unclear discharge plan. Consultants:  Nephrology  GI  Psych  Procedures:  Paracentesis Antimicrobials:   Cipro  Status is: Inpatient   Dispo:  The patient is from: Home              Anticipated d/c is to: Group home?              Anticipated d/c date is: > 3 days              Patient currently is medically stable to d/c.    Subjective: Patient seen and examined.  Sitting comfortably on the bed.  On room air.  Had a good night sleep last night.  Continues to have upper respiratory symptoms-nasal congestion, cough, itchy throat and ear.  No fever.  No acute events overnight reported.  Objective: Vitals:   01/18/20 2028 01/18/20 2335 01/19/20 0557 01/19/20 0817  BP: 104/83 105/77 (!) 115/96 (!) 85/67  Pulse: 66 65 73 76  Resp: 16 18 18 16   Temp: (!) 97.5 F (36.4 C) 97.7 F (36.5 C) 97.7 F (36.5 C) 97.8 F (36.6 C)  TempSrc: Oral Oral Oral   SpO2: 97% 100% 94% 100%  Weight:      Height:       No intake or output data in the 24 hours ending 01/19/20 0948 Filed Weights   01/03/20 0451 01/05/20 0344 01/09/20 0456  Weight: 96.7 kg 95.3 kg 95.7 kg    Examination:  General exam: Appears calm and comfortable, on room air, communicating well Respiratory system: Clear to auscultation. Respiratory effort normal.  No wheezing or rhonchi. Cardiovascular system: S1 & S2 heard, RRR. No JVD, murmurs, rubs, gallops or clicks. No pedal edema. Gastrointestinal system: Abdomen is obese, soft and nontender. No organomegaly or masses felt. Normal bowel sounds heard. Central nervous system: Alert and oriented. No focal neurological deficits. Extremities: Symmetric 5 x 5 power. Skin: No rashes, lesions or ulcers    Data Reviewed: I have personally reviewed following labs and imaging studies  CBC: Recent Labs  Lab 01/15/20 0707 01/18/20 0421  WBC 4.4 4.9  HGB 10.2* 10.4*  HCT 34.0* 34.0*  MCV 73.4* 72.6*  PLT 332 678   Basic Metabolic Panel: Recent Labs  Lab 01/15/20 0707 01/15/20 0926  NA  --  137  K  --  3.9  CL  --  103  CO2  --  23  GLUCOSE  --  157*  BUN  --  56*  CREATININE 2.98* 2.96*  CALCIUM  --  8.9  MG   --  2.0   GFR: Estimated Creatinine Clearance: 22.4 mL/min (A) (by C-G formula based on SCr of 2.96 mg/dL (H)). Liver Function Tests: No results for input(s): AST, ALT, ALKPHOS, BILITOT, PROT, ALBUMIN in the last 168 hours. No results for input(s): LIPASE, AMYLASE in the last 168 hours. No results for input(s): AMMONIA in the last 168 hours. Coagulation Profile: No results for input(s): INR, PROTIME in the last 168 hours. Cardiac Enzymes: No results for input(s): CKTOTAL, CKMB, CKMBINDEX, TROPONINI in the last 168 hours. BNP (last 3 results) No results for input(s): PROBNP in the last 8760 hours. HbA1C: No results for input(s): HGBA1C in the last 72 hours. CBG: No results for input(s): GLUCAP in the last 168 hours. Lipid Profile: No results for input(s): CHOL, HDL, LDLCALC, TRIG, CHOLHDL, LDLDIRECT in the last 72 hours. Thyroid Function Tests: No results for input(s): TSH, T4TOTAL, FREET4, T3FREE, THYROIDAB in the last 72 hours. Anemia Panel: No results for input(s): VITAMINB12, FOLATE,  FERRITIN, TIBC, IRON, RETICCTPCT in the last 72 hours. Sepsis Labs: No results for input(s): PROCALCITON, LATICACIDVEN in the last 168 hours.  Recent Results (from the past 240 hour(s))  Resp Panel by RT-PCR (Flu A&B, Covid) Nasopharyngeal Swab     Status: None   Collection Time: 01/10/20  4:44 PM   Specimen: Nasopharyngeal Swab; Nasopharyngeal(NP) swabs in vial transport medium  Result Value Ref Range Status   SARS Coronavirus 2 by RT PCR NEGATIVE NEGATIVE Final    Comment: (NOTE) SARS-CoV-2 target nucleic acids are NOT DETECTED.  The SARS-CoV-2 RNA is generally detectable in upper respiratory specimens during the acute phase of infection. The lowest concentration of SARS-CoV-2 viral copies this assay can detect is 138 copies/mL. A negative result does not preclude SARS-Cov-2 infection and should not be used as the sole basis for treatment or other patient management decisions. A negative  result may occur with  improper specimen collection/handling, submission of specimen other than nasopharyngeal swab, presence of viral mutation(s) within the areas targeted by this assay, and inadequate number of viral copies(<138 copies/mL). A negative result must be combined with clinical observations, patient history, and epidemiological information. The expected result is Negative.  Fact Sheet for Patients:  EntrepreneurPulse.com.au  Fact Sheet for Healthcare Providers:  IncredibleEmployment.be  This test is no t yet approved or cleared by the [place] FDA and  has been authorized for detection and/or diagnosis of SARS-CoV-2 by FDA under an Emergency Use Authorization (EUA). This EUA will remain  in effect (meaning this test can be used) for the duration of the COVID-19 declaration under Section 564(b)(1) of the Act, 21 U.S.C.section 360bbb-3(b)(1), unless the authorization is terminated  or revoked sooner.       Influenza A by PCR NEGATIVE NEGATIVE Final   Influenza B by PCR NEGATIVE NEGATIVE Final    Comment: (NOTE) The Xpert Xpress SARS-CoV-2/FLU/RSV plus assay is intended as an aid in the diagnosis of influenza from Nasopharyngeal swab specimens and should not be used as a sole basis for treatment. Nasal washings and aspirates are unacceptable for Xpert Xpress SARS-CoV-2/FLU/RSV testing.  Fact Sheet for Patients: EntrepreneurPulse.com.au  Fact Sheet for Healthcare Providers: IncredibleEmployment.be  This test is not yet approved or cleared by the [place] FDA and has been authorized for detection and/or diagnosis of SARS-CoV-2 by FDA under an Emergency Use Authorization (EUA). This EUA will remain in effect (meaning this test can be used) for the duration of the COVID-19 declaration under Section 564(b)(1) of the Act, 21 U.S.C. section 360bbb-3(b)(1), unless the authorization is  terminated or revoked.  Performed at [facility], [address] [place], [place] [address]       Radiology Studies: No results found.  Scheduled Meds: . apixaban  5 mg Oral BID  . bumetanide  2 mg Oral BID  . ciprofloxacin  500 mg Oral Q breakfast  . epoetin (EPOGEN/PROCRIT) injection  20,000 Units Subcutaneous Weekly  . fluticasone  2 spray Each Nare Daily  . guaiFENesin  600 mg Oral TID  . iron polysaccharides  150 mg Oral Daily  . ketotifen  1 drop Both Eyes BID  . loratadine  10 mg Oral Daily  . mouth rinse  15 mL Mouth Rinse BID  . melatonin  2.5 mg Oral QHS  . midodrine  10 mg Oral TID WC  . multivitamin  1 tablet Oral QHS  . pantoprazole  40 mg Oral BID  . polyethylene glycol  17 g Oral Daily  .  spironolactone  25 mg Oral Daily   Continuous Infusions:   LOS: 92 days   Time spent: 35 minutes.   Mckinley Jewel, MD [facility]  If 7PM-7AM, please contact night-coverage [email or web address] 01/19/2020, 9:48 AM

## 2020-01-19 NOTE — Progress Notes (Signed)
Nutrition Follow-up  DOCUMENTATION CODES:   Obesity unspecified  INTERVENTION:  Continue Rena-vit po QHS  Encouraged po intake    NUTRITION DIAGNOSIS:   Increased nutrient needs related to chronic illness (cirrhosis, CHF) as evidenced by estimated needs. -ongoing  GOAL:   Patient will meet greater than or equal to 90% of their needs - meeting  MONITOR:   PO intake,Supplement acceptance,Labs,Weight trends,Skin,I & O's  REASON FOR ASSESSMENT:   LOS    ASSESSMENT:   65 y.o. 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  Pt s/p paracentesis 11/3 with 4L output   RD working remotely.  Nutrition follow-up completed with pt via phone this afternoon. Pt reports feeling restless today, says she is eating well, recalls 100% of hamburger with lettuce, tomato, mayonnaise for lunch. She is undecided about dinner, but thinks she is going to order talipea and creamed potatoes.  Per chart, pt continues to eat well; 100% of the last 8 documented meals from 1/6-1/1.   No new weights in the last 10 days, pt ordered weekly weights.   Pt with unclear discharge plan, she no longer meets criteria for SNF, currently refusing group home.  Medications reviewed and include: Niferex, Melatonin, Rena-vit, Protonix  Labs: BUN 56 (H), Cr 2.96 (H)  Diet Order:   Diet Order            Diet Heart Room service appropriate? Yes; Fluid consistency: Thin  Diet effective now           Diet - low sodium heart healthy                 EDUCATION NEEDS:   Education needs have been addressed  Skin:  Skin Assessment: Skin Integrity Issues: Skin Integrity Issues:: Incisions Incisions: closed incision lower abdomen  Last BM:  1/10-type 6  Height:   Ht  Readings from Last 1 Encounters:  11/13/19 5\' 6"  (1.676 m)    Weight:   Wt Readings from Last 1 Encounters:  01/09/20 95.7 kg    Ideal Body Weight:  59 kg  BMI:  Body mass index is 34.04 kg/m.  Estimated Nutritional Needs:   Kcal:  1900-2200kcal/day  Protein:  95-110g/day  Fluid:  1.8-2L/day   Lajuan Lines, RD, LDN Clinical Nutrition After Hours/Weekend Pager # in [place]

## 2020-01-19 NOTE — Progress Notes (Signed)
   01/19/20 1100  Clinical Encounter Type  Visited With Patient  Visit Type Follow-up  Referral From Chaplain  Consult/Referral To Chaplain  Chaplain stopped in to briefly check on Pt and to see how she is doing. Chaplain asked Pt if she has made a decision about taking dialysis and she said her doctor said she didn't need dialysis. Chaplain dropped the subject and went on to talk about other things. Pt seems to be in good spirits and was glad to see chaplain.

## 2020-01-20 DIAGNOSIS — I513 Intracardiac thrombosis, not elsewhere classified: Secondary | ICD-10-CM | POA: Diagnosis not present

## 2020-01-20 DIAGNOSIS — K652 Spontaneous bacterial peritonitis: Secondary | ICD-10-CM | POA: Diagnosis not present

## 2020-01-20 DIAGNOSIS — N1832 Chronic kidney disease, stage 3b: Secondary | ICD-10-CM | POA: Diagnosis not present

## 2020-01-20 DIAGNOSIS — I5031 Acute diastolic (congestive) heart failure: Secondary | ICD-10-CM | POA: Diagnosis not present

## 2020-01-20 LAB — COMPREHENSIVE METABOLIC PANEL
ALT: 11 U/L (ref 0–44)
AST: 26 U/L (ref 15–41)
Albumin: 2.7 g/dL — ABNORMAL LOW (ref 3.5–5.0)
Alkaline Phosphatase: 129 U/L — ABNORMAL HIGH (ref 38–126)
Anion gap: 10 (ref 5–15)
BUN: 60 mg/dL — ABNORMAL HIGH (ref 8–23)
CO2: 25 mmol/L (ref 22–32)
Calcium: 9.6 mg/dL (ref 8.9–10.3)
Chloride: 102 mmol/L (ref 98–111)
Creatinine, Ser: 3.02 mg/dL — ABNORMAL HIGH (ref 0.44–1.00)
GFR, Estimated: 17 mL/min — ABNORMAL LOW (ref >60)
Glucose, Bld: 90 mg/dL (ref 70–99)
Potassium: 4.4 mmol/L (ref 3.5–5.1)
Sodium: 137 mmol/L (ref 135–145)
Total Bilirubin: 0.9 mg/dL (ref 0.3–1.2)
Total Protein: 7 g/dL (ref 6.5–8.1)

## 2020-01-20 LAB — MAGNESIUM: Magnesium: 2.1 mg/dL (ref 1.7–2.4)

## 2020-01-20 LAB — CBC
HCT: 33.4 % — ABNORMAL LOW (ref 36.0–46.0)
Hemoglobin: 9.8 g/dL — ABNORMAL LOW (ref 12.0–15.0)
MCH: 21.4 pg — ABNORMAL LOW (ref 26.0–34.0)
MCHC: 29.3 g/dL — ABNORMAL LOW (ref 30.0–36.0)
MCV: 72.8 fL — ABNORMAL LOW (ref 80.0–100.0)
Platelets: 382 10*3/uL (ref 150–400)
RBC: 4.59 MIL/uL (ref 3.87–5.11)
RDW: 17.5 % — ABNORMAL HIGH (ref 11.5–15.5)
WBC: 4 10*3/uL (ref 4.0–10.5)
nRBC: 0 % (ref 0.0–0.2)

## 2020-01-20 NOTE — Progress Notes (Signed)
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  After admission to the hospital, she was diagnosed with acute on chronic combinedsystolic and diastolic congestive heart failurewith ejection fraction 25 to 30%. She was not responding to IV Lasix. She was seen by nephrology, temporary dialysis catheter was performed and she was dialyzed on 10/17 and 10/18.Patient also complaining of abdominal pain, she had a paracentesis, study showed possible spontaneous peritonitis, she was treated with Rocephin and Flagyl.  Patient had a worsening respiratory status on 10/17, was diagnosed with aspiration pneumonia. She was treated with Rocephin, Zithromax and Flagyl. Patient also had abdominal pain and a CT scan suspect ischemia, but seen by general surgery on 10/19, no need for surgery.  Patient with no complaints.  Assessment & Plan:   Spontaneous bacterial peritonitis:  -Status post paracentesis on 01/11/2020 -Continue cipro for ppx   Acute on chronic combined systolic and diastolic congestive heart failure. Acute hypoxemic respiratory failure. -Echo from 06/22/2019 showed ejection fraction of 25 to 30% with grade 3 diastolic dysfunction. She was dialyzed x2, but now getting diuresed. Pt reported does not want to get dialysis again. -Continue on Aldactone and Bumex.  Monitor INO's. -Continue on nocturnal oxygen as needed.  Liver cirrhosis with ascites:Noted. GI consulted previously in hospitalization (see prior notes). -Continue  cipro for SBP ppx  Hypotension:  Now stable.  Remains on midodrine.  Acute renal failure on chronic kidney disease stage IIIb: -Patient refused for dialysis.  GFR staying around 15.  Monitor kidney function closely.  Hyponatremia: Mild.  -Improved from [phone].  Continue to monitor.  She is asymptomatic.  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  No evidence of active bleeding.  GERD: Continue PPI   Chronic constipation: cont. MiraLAX.  Sinus Congestion: with itchy throat & ear -Cont. Claritin,  Flonase & Afrin Nasal spray.  Disposition: Patient no longer meets criteria for SNF.  No family at home to discharge to.  There is a question for her capacity and while she may not be able to have an immediate plan to care for self, does not mean that she lacks capacity. .   DVT prophylaxis: Eliquis Code Status: DNR Family Communication: No family Disposition Plan: Possible group home Consultants:   Nephrology  GI  Psych  Procedures:  Paracentesis Antimicrobials:   Cipro  Status is: Inpatient   Dispo: The patient is from: Home              Anticipated d/c is to: Group home?              Anticipated d/c date is: > 3 days              Patient currently is medically stable to d/c.     Objective: Vitals:   01/19/20 2146 01/19/20 2332 01/20/20 0447 01/20/20 0822  BP: 112/89 (!) 104/93 102/87 113/81  Pulse: 64 71 69 72  Resp:    18  Temp: 97.7 F (36.5 C) (!) 97.5 F (36.4 C) (!) 97.4 F (36.3 C) 98 F (  36.7 C)  TempSrc: Oral Oral Oral   SpO2: 100% 98% 100% 100%  Weight:      Height:        Intake/Output Summary (Last 24 hours) at 01/20/2020 1655 Last data filed at 01/20/2020 1022 Gross per 24 hour  Intake 240 ml  Output --  Net 240 ml   Filed Weights   01/03/20 0451 01/05/20 0344 01/09/20 0456  Weight: 96.7 kg 95.3 kg 95.7 kg     Examination:  General exam: Alert and oriented x2, no acute distress Respiratory system: Clear to auscultation bilaterally Cardiovascular system: Regular rate and rhythm, S1-S2 Gastrointestinal system: Soft, nontender, nondistended, positive bowel sounds Extremities: No clubbing or cyanosis or edema    Data Reviewed: I have personally reviewed following labs and imaging studies  CBC: Recent Labs  Lab 01/15/20 0707 01/18/20 0421 01/20/20 0324  WBC 4.4 4.9 4.0  HGB 10.2* 10.4* 9.8*  HCT 34.0* 34.0* 33.4*  MCV 73.4* 72.6* 72.8*  PLT 332 372 324   Basic Metabolic Panel: Recent Labs  Lab 01/15/20 0707 01/15/20 0926 01/20/20 0324  NA  --  137 137  K  --  3.9 4.4  CL  --  103 102  CO2  --  23 25  GLUCOSE  --  157* 90  BUN  --  56* 60*  CREATININE 2.98* 2.96* 3.02*  CALCIUM  --  8.9 9.6  MG  --  2.0 2.1   GFR: Estimated Creatinine Clearance: 22 mL/min (A) (by C-G formula based on SCr of 3.02 mg/dL (H)). Liver Function Tests: Recent Labs  Lab 01/20/20 0324  AST 26  ALT 11  ALKPHOS 129*  BILITOT 0.9  PROT 7.0  ALBUMIN 2.7*   No results for input(s): LIPASE, AMYLASE in the last 168 hours. No results for input(s): AMMONIA in the last 168 hours. Coagulation Profile: No results for input(s): INR, PROTIME in the last 168 hours. Cardiac Enzymes: No results for input(s): CKTOTAL, CKMB, CKMBINDEX, TROPONINI in the last 168 hours. BNP (last 3 results) No results for input(s): PROBNP in the last 8760 hours. HbA1C: No results for input(s): HGBA1C in the last 72 hours. CBG: No results for input(s): GLUCAP in the last 168 hours. Lipid Profile: No results for input(s): CHOL, HDL, LDLCALC, TRIG, CHOLHDL, LDLDIRECT in the last 72 hours. Thyroid Function Tests: No results for input(s): TSH, T4TOTAL, FREET4, T3FREE, THYROIDAB in the last 72 hours. Anemia Panel: No results for input(s): VITAMINB12, FOLATE, FERRITIN, TIBC, IRON, RETICCTPCT in the last 72 hours. Sepsis  Labs: No results for input(s): PROCALCITON, LATICACIDVEN in the last 168 hours.  No results found for this or any previous visit (from the past 240 hour(s)).    Radiology Studies: No results found.  Scheduled Meds: . apixaban  5 mg Oral BID  . bumetanide  2 mg Oral BID  . ciprofloxacin  500 mg Oral Q breakfast  . epoetin (EPOGEN/PROCRIT) injection  20,000 Units Subcutaneous Weekly  . fluticasone  2 spray Each Nare Daily  . guaiFENesin  600 mg Oral TID  . iron polysaccharides  150 mg Oral Daily  . ketotifen  1 drop Both Eyes BID  . loratadine  10 mg Oral Daily  . mouth rinse  15 mL Mouth Rinse BID  . melatonin  2.5 mg Oral QHS  . midodrine  10 mg Oral TID WC  . multivitamin  1 tablet Oral QHS  . pantoprazole  40 mg Oral BID  . polyethylene glycol  17 g Oral  Daily  . spironolactone  25 mg Oral Daily   Continuous Infusions:   LOS: 93 days   Time spent: 35 minutes.   Annita Brod, MD [facility]  If 7PM-7AM, please contact night-coverage [email or web address] 01/20/2020, 4:55 PM

## 2020-01-21 DIAGNOSIS — K652 Spontaneous bacterial peritonitis: Secondary | ICD-10-CM | POA: Diagnosis not present

## 2020-01-21 DIAGNOSIS — N1832 Chronic kidney disease, stage 3b: Secondary | ICD-10-CM | POA: Diagnosis not present

## 2020-01-21 DIAGNOSIS — I5031 Acute diastolic (congestive) heart failure: Secondary | ICD-10-CM | POA: Diagnosis not present

## 2020-01-21 DIAGNOSIS — I513 Intracardiac thrombosis, not elsewhere classified: Secondary | ICD-10-CM | POA: Diagnosis not present

## 2020-01-21 NOTE — Plan of Care (Signed)
Patient's progression for POC is adequate for discharge. She is currently awaiting approval for bed placement.  Problem: Clinical Measurements: Goal: Will remain free from infection Outcome: Adequate for Discharge Goal: Diagnostic test results will improve Outcome: Adequate for Discharge Goal: Respiratory complications will improve Outcome: Adequate for Discharge Goal: Cardiovascular complication will be avoided Outcome: Adequate for Discharge   Problem: Nutrition: Goal: Adequate nutrition will be maintained Outcome: Adequate for Discharge   Problem: Coping: Goal: Level of anxiety will decrease Outcome: Adequate for Discharge   Problem: Elimination: Goal: Will not experience complications related to bowel motility Outcome: Adequate for Discharge Goal: Will not experience complications related to urinary retention Outcome: Adequate for Discharge

## 2020-01-21 NOTE — Progress Notes (Signed)
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  After admission to the hospital, she was diagnosed with acute on chronic combinedsystolic and diastolic congestive heart failurewith ejection fraction 25 to 30%. She was not responding to IV Lasix. She was seen by nephrology, temporary dialysis catheter was performed and she was dialyzed on 10/17 and 10/18.Patient also complaining of abdominal pain, she had a paracentesis, study showed possible spontaneous peritonitis, she was treated with Rocephin and Flagyl.  Patient had a worsening respiratory status on 10/17, was diagnosed with aspiration pneumonia. She was treated with Rocephin, Zithromax and Flagyl. Patient also had abdominal pain and a CT scan suspect ischemia, but seen by general surgery on 10/19, no need for surgery.  Patient with no complaints.  She is to be assessed by DSS today.  Assessment & Plan:   Spontaneous bacterial peritonitis:  -Status post paracentesis on 01/11/2020 -Continue cipro for ppx   Acute on chronic combined systolic and diastolic congestive heart failure. Acute hypoxemic respiratory failure. -Echo from 06/22/2019 showed ejection fraction of 25 to 30% with grade 3 diastolic dysfunction. She was dialyzed x2, but now getting diuresed. Pt reported does not want to get dialysis again. -Continue on Aldactone and Bumex.  Monitor INO's. -Continue on nocturnal oxygen as needed.  Liver cirrhosis with ascites:Noted. GI consulted previously in  hospitalization (see prior notes). -Continue cipro for SBP ppx  Hypotension:  Now stable.  Remains on midodrine.  Acute renal failure on chronic kidney disease stage IIIb: -Patient refused further dialysis.  GFR staying around 15.  Monitor kidney function closely.  Hyponatremia: Mild.  -Improved from [phone].  Continue to monitor.  She is asymptomatic.  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  No evidence of active bleeding.  GERD: Continue PPI   Chronic constipation: cont. MiraLAX.  Sinus Congestion: with itchy throat & ear -Cont. Claritin,  Flonase & Afrin Nasal spray.  Disposition: Patient no longer meets criteria for SNF.  No family at home to discharge to.  There is a question for her capacity and while she may not be able to have an immediate plan to care for self, does not mean that she lacks capacity. To be assessed by DSS today.  DVT prophylaxis: Eliquis Code Status: DNR Family Communication: No family Disposition Plan: Possible group home Consultants:   Nephrology  GI  Psych  Procedures:  Paracentesis  Antimicrobials:   Cipro  Status is: Inpatient   Dispo: The patient is from: Home              Anticipated d/c is to: Group home?  To be determined              Anticipated d/c date is: > 3 days              Patient currently is medically stable to d/c.     Objective: Vitals:   01/20/20 2320 01/21/20 0557 01/21/20 0804 01/21/20 1122  BP: 113/79 (!) 110/94 102/83 (!) 130/100  Pulse: 85 75 68 74  Resp:   16  17  Temp: 97.6 F (36.4 C) (!) 97.5 F (36.4 C) 98.2 F (36.8 C) 98.3 F (36.8 C)  TempSrc:      SpO2: 100% 99% 93% 100%  Weight:      Height:        Intake/Output Summary (Last 24 hours) at 01/21/2020 1427 Last data filed at 01/20/2020 2030 Gross per 24 hour  Intake -  Output 1 ml  Net -1 ml   Filed Weights   01/03/20 0451  01/05/20 0344 01/09/20 0456  Weight: 96.7 kg 95.3 kg 95.7 kg    Examination: Unchanged from previous day General exam: Alert and oriented x2, no acute distress Respiratory system: Clear to auscultation bilaterally Cardiovascular system: Regular rate and rhythm, S1-S2 Gastrointestinal system: Soft, nontender, nondistended, positive bowel sounds Extremities: No clubbing or cyanosis or edema    Data Reviewed: I have personally reviewed following labs and imaging studies  CBC: Recent Labs  Lab 01/15/20 0707 01/18/20 0421 01/20/20 0324  WBC 4.4 4.9 4.0  HGB 10.2* 10.4* 9.8*  HCT 34.0* 34.0* 33.4*  MCV 73.4* 72.6* 72.8*  PLT 332 372 481   Basic Metabolic Panel: Recent Labs  Lab 01/15/20 0707 01/15/20 0926 01/20/20 0324  NA  --  137 137  K  --  3.9 4.4  CL  --  103 102  CO2  --  23 25  GLUCOSE  --  157* 90  BUN  --  56* 60*  CREATININE 2.98* 2.96* 3.02*  CALCIUM  --  8.9 9.6  MG  --  2.0 2.1   GFR: Estimated Creatinine Clearance: 22 mL/min (A) (by C-G formula based on SCr of 3.02 mg/dL (H)). Liver Function Tests: Recent Labs  Lab 01/20/20 0324  AST 26  ALT 11  ALKPHOS 129*  BILITOT 0.9  PROT 7.0  ALBUMIN 2.7*   No results for input(s): LIPASE, AMYLASE in the last 168 hours. No results for input(s): AMMONIA in the last 168 hours. Coagulation Profile: No results for input(s): INR, PROTIME in the last 168 hours. Cardiac Enzymes: No results for input(s): CKTOTAL, CKMB, CKMBINDEX, TROPONINI in the last 168 hours. BNP (last 3 results) No results for input(s): PROBNP in the last 8760 hours. HbA1C: No results for input(s): HGBA1C in the last 72 hours. CBG: No results for input(s): GLUCAP in the last 168 hours. Lipid Profile: No results for input(s): CHOL, HDL, LDLCALC, TRIG, CHOLHDL, LDLDIRECT in the last 72 hours. Thyroid Function Tests: No results for input(s): TSH, T4TOTAL, FREET4, T3FREE, THYROIDAB in the last 72 hours. Anemia Panel: No results for  input(s): VITAMINB12, FOLATE, FERRITIN, TIBC, IRON, RETICCTPCT in the last 72 hours. Sepsis Labs: No results for input(s): PROCALCITON, LATICACIDVEN in the last 168 hours.  No results found for this or any previous visit (from the past 240 hour(s)).    Radiology Studies: No results found.  Scheduled Meds: . apixaban  5 mg Oral BID  . bumetanide  2 mg Oral BID  . ciprofloxacin  500 mg Oral Q breakfast  . epoetin (EPOGEN/PROCRIT) injection  20,000 Units Subcutaneous Weekly  . fluticasone  2 spray Each Nare Daily  . guaiFENesin  600 mg Oral TID  . iron polysaccharides  150 mg Oral Daily  . ketotifen  1 drop Both Eyes BID  . loratadine  10 mg Oral Daily  . mouth rinse  15 mL Mouth Rinse BID  . melatonin  2.5 mg Oral QHS  . midodrine  10 mg Oral TID WC  . multivitamin  1 tablet Oral QHS  . pantoprazole  40 mg Oral BID  . polyethylene glycol  17 g Oral Daily  . spironolactone  25 mg Oral Daily   Continuous Infusions:   LOS: 94 days   Time spent: 35 minutes.   Annita Brod, MD [facility]  If 7PM-7AM, please contact night-coverage [email or web address] 01/21/2020, 2:27 PM

## 2020-01-21 NOTE — TOC Progression Note (Addendum)
Transition of Care [facility]) - Progression Note    Patient Details  Name: Katelyn Lane MRN: [id number] Date of Birth: [birth year]  Transition [facility]) CM/SW Contact  Anselm Pancoast, RN Phone Number: 01/21/2020, 12:18 PM  Clinical Narrative:    Secure email sent to [facility] Day-DSS Director [place] requesting update on guardianship assessment and possible resources to assist with discharge from hospital. Patient refused [facility] offered and psych deemed patient lacking capacity for discharge home alone.    APS report made 10/22 and last communication with DSS was 12/22/19.   12:37pm Spoke to [employer] @ DSS who states she will outreach to [place] and have her call today with update. If no reply from [place] by 4pm today-Kailee asked for a return call.         Expected Discharge Plan and Services           Expected Discharge Date: 01/08/20                                     Social Determinants of Health (SDOH) Interventions    Readmission Risk Interventions Readmission Risk Prevention Plan 09/02/2019 08/14/2019 07/27/2019  Transportation Screening Complete Complete Complete  Medication Review [employer]) Complete Referral to Pharmacy -  PCP or Specialist appointment within 3-5 days of discharge Complete Complete Complete  HRI or Home Care Consult - Complete Complete  SW Recovery Care/Counseling Consult - Complete Complete  Palliative Care Screening Not Applicable Not Applicable Not Applicable  Skilled Nursing Facility Complete Not Applicable Not Applicable  Some recent data might be hidden

## 2020-01-21 NOTE — TOC Progression Note (Signed)
Transition of Care [facility]) - Progression Note    Patient Details  Name: Katelyn Lane MRN: [id number] Date of Birth: 02-Nov-1955  Transition [facility]) CM/SW Contact  Anselm Pancoast, RN Phone Number: 01/21/2020, 2:19 PM  Clinical Narrative:    Damaris Schooner to Norvel Richards @ DSS states she is coming to assess patient today @ 3pm. Requested psychiatrist complete letter stating competency or not and reasonings to support.   Updated treatment team including need for letter from psych. Dr. Weber Cooks agreed to have completed by tomorrow. RN CM will ensure delivery to [facility] @ DSS.         Expected Discharge Plan and Services           Expected Discharge Date: 01/08/20                                     Social Determinants of Health (SDOH) Interventions    Readmission Risk Interventions Readmission Risk Prevention Plan 09/02/2019 08/14/2019 07/27/2019  Transportation Screening Complete Complete Complete  Medication Review [employer]) Complete Referral to Pharmacy -  PCP or Specialist appointment within 3-5 days of discharge Complete Complete Complete  HRI or Home Care Consult - Complete Complete  SW Recovery Care/Counseling Consult - Complete Complete  Palliative Care Screening Not Applicable Not Applicable Not Applicable  Skilled Nursing Facility Complete Not Applicable Not Applicable  Some recent data might be hidden

## 2020-01-22 ENCOUNTER — Inpatient Hospital Stay: Payer: Medicaid Other

## 2020-01-22 DIAGNOSIS — J0100 Acute maxillary sinusitis, unspecified: Secondary | ICD-10-CM

## 2020-01-22 DIAGNOSIS — K652 Spontaneous bacterial peritonitis: Secondary | ICD-10-CM | POA: Diagnosis not present

## 2020-01-22 DIAGNOSIS — N1832 Chronic kidney disease, stage 3b: Secondary | ICD-10-CM | POA: Diagnosis not present

## 2020-01-22 DIAGNOSIS — I5031 Acute diastolic (congestive) heart failure: Secondary | ICD-10-CM | POA: Diagnosis not present

## 2020-01-22 MED ORDER — SALINE SPRAY 0.65 % NA SOLN
1.0000 | NASAL | Status: DC | PRN
  Filled 2020-01-22: qty 44

## 2020-01-22 NOTE — Progress Notes (Addendum)
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  After admission to the hospital, she was diagnosed with acute on chronic combinedsystolic and diastolic congestive heart failurewith ejection fraction 25 to 30%. She was not responding to IV Lasix. She was seen by nephrology, temporary dialysis catheter was performed and she was dialyzed on 10/17 and 10/18.Patient also complaining of abdominal pain, she had a paracentesis, study showed possible spontaneous peritonitis, she was treated with Rocephin and Flagyl.  Patient had a worsening respiratory status on 10/17, was diagnosed with aspiration pneumonia. She was treated with Rocephin, Zithromax and Flagyl. Patient also had abdominal pain and a CT scan suspect ischemia, but seen by general surgery on 10/19, no need for surgery.  Seen by DSS on 1/13.  Outcome pending.  Patient today with no complaints, although this evening she complains of some sinus tenderness as well as drainage and cough. Patient examined and she does have right maxillary tenderness.  Assessment & Plan:  Spontaneous bacterial peritonitis:  -Status post paracentesis on 01/11/2020 -Continue cipro for ppx   Acute on chronic combined systolic and diastolic congestive heart failure. Acute hypoxemic respiratory failure. -Echo from 06/22/2019 showed ejection fraction of 25 to 30% with grade 3 diastolic dysfunction.  She was dialyzed x2, but now getting diuresed. Pt stated she does not want to get dialysis again.  For  now, continue on Aldactone and Bumex plus O2 at night as needed.  Liver cirrhosis with ascites:Noted. GI consulted previously in hospitalization.  On cipro for SBP ppx  Hypotension:  Now stable.  Remains on midodrine.  Acute renal failure on chronic kidney disease stage IIIb: -Patient refused further dialysis.  GFR staying around 15.  Following kidney function.  Hyponatremia: Mild.  As low as 128.  At last check, at 137.  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  No evidence of active bleeding.  GERD: Continue PPI   Chronic constipation: cont. MiraLAX.  Sinus Congestion: with itchy throat & ear -Cont. Claritin,  Flonase & Afrin Nasal spray. Appears to have right maxillary tenderness. She was unwilling to start new antibiotics, but willing for [name] to get a sinus CT which we have ordered.  Disposition: Patient no longer meets criteria for SNF.  No family at home to discharge to.  There is a question for her capacity and while she may not be able to have an immediate plan to care for self, does not mean that she lacks capacity. Outcome of DSS assessment pending.  DVT prophylaxis: Eliquis Code Status: DNR Family Communication: No family Disposition Plan: Possible group home Consultants:   Nephrology  GI  Psych  Procedures:  Paracentesis  Antimicrobials:   Cipro  Status is: Inpatient   Dispo: The patient is from: Home              Anticipated d/c is to: Group home?  To be determined              Anticipated d/c date is: > 3 days  Patient currently is medically stable to d/c.     Objective: Vitals:   01/21/20 2116 01/22/20 0147 01/22/20 0445 01/22/20 0811  BP: (!) 117/91 (!) 129/94 118/90 100/89  Pulse: 75 70 74 70  Resp: 16 16 16 16   Temp: 97.7 F (36.5 C) 97.6 F (36.4 C) (!) 97.2 F (36.2 C) 98.2 F (36.8 C)  TempSrc:   Oral   SpO2: 96%  100% 100% 100%  Weight:      Height:        Intake/Output Summary (Last 24 hours) at 01/22/2020 0920 Last data filed at 01/21/2020 1853 Gross per 24 hour  Intake 240 ml  Output --  Net 240 ml   Filed Weights   01/03/20 0451 01/05/20 0344 01/09/20 0456  Weight: 96.7 kg 95.3 kg 95.7 kg    Examination: Unchanged from previous day General exam: Alert and oriented x2, no acute distress Respiratory system: Clear to auscultation bilaterally Cardiovascular system: Regular rate and rhythm, S1-S2 Gastrointestinal system: Soft, nontender, nondistended, positive bowel sounds Extremities: No clubbing or cyanosis or edema    Data Reviewed: I have personally reviewed following labs and imaging studies  CBC: Recent Labs  Lab 01/18/20 0421 01/20/20 0324  WBC 4.9 4.0  HGB 10.4* 9.8*  HCT 34.0* 33.4*  MCV 72.6* 72.8*  PLT 372 892   Basic Metabolic Panel: Recent Labs  Lab 01/15/20 0926 01/20/20 0324  NA 137 137  K 3.9 4.4  CL 103 102  CO2 23 25  GLUCOSE 157* 90  BUN 56* 60*  CREATININE 2.96* 3.02*  CALCIUM 8.9 9.6  MG 2.0 2.1   GFR: Estimated Creatinine Clearance: 22 mL/min (A) (by C-G formula based on SCr of 3.02 mg/dL (H)). Liver Function Tests: Recent Labs  Lab 01/20/20 0324  AST 26  ALT 11  ALKPHOS 129*  BILITOT 0.9  PROT 7.0  ALBUMIN 2.7*   No results for input(s): LIPASE, AMYLASE in the last 168 hours. No results for input(s): AMMONIA in the last 168 hours. Coagulation Profile: No results for input(s): INR, PROTIME in the last 168 hours. Cardiac Enzymes: No results for input(s): CKTOTAL, CKMB, CKMBINDEX, TROPONINI in the last 168 hours. BNP (last 3 results) No results for input(s): PROBNP in the last 8760 hours. HbA1C: No results for input(s): HGBA1C in the last 72 hours. CBG: No results for input(s): GLUCAP in the last 168 hours. Lipid Profile: No results for input(s): CHOL, HDL, LDLCALC, TRIG, CHOLHDL, LDLDIRECT in the last 72 hours. Thyroid  Function Tests: No results for input(s): TSH, T4TOTAL, FREET4, T3FREE, THYROIDAB in the last 72 hours. Anemia Panel: No results for input(s): VITAMINB12, FOLATE, FERRITIN, TIBC, IRON, RETICCTPCT in the last 72 hours. Sepsis Labs: No results for input(s): PROCALCITON, LATICACIDVEN in the last 168 hours.  No results found for this or any previous visit (from the past 240 hour(s)).    Radiology Studies: No results found.  Scheduled Meds: . apixaban  5 mg Oral BID  . bumetanide  2 mg Oral BID  . ciprofloxacin  500 mg Oral Q breakfast  . epoetin (EPOGEN/PROCRIT) injection  20,000 Units Subcutaneous Weekly  . fluticasone  2 spray Each Nare Daily  . guaiFENesin  600 mg Oral TID  . iron polysaccharides  150 mg Oral Daily  . ketotifen  1 drop Both Eyes BID  . loratadine  10 mg Oral Daily  . mouth rinse  15 mL Mouth Rinse BID  . melatonin  2.5 mg Oral QHS  . midodrine  10 mg Oral TID WC  . multivitamin  1 tablet Oral QHS  . pantoprazole  40 mg Oral BID  . polyethylene glycol  17 g Oral Daily  . spironolactone  25 mg Oral Daily   Continuous Infusions:   LOS: 95 days   Time spent: 35 minutes.   Annita Brod, MD [facility]  If 7PM-7AM, please contact night-coverage [email or web address] 01/22/2020, 9:20 AM

## 2020-01-23 DIAGNOSIS — K652 Spontaneous bacterial peritonitis: Secondary | ICD-10-CM | POA: Diagnosis not present

## 2020-01-23 LAB — CBC
HCT: 35.2 % — ABNORMAL LOW (ref 36.0–46.0)
Hemoglobin: 10.7 g/dL — ABNORMAL LOW (ref 12.0–15.0)
MCH: 21.9 pg — ABNORMAL LOW (ref 26.0–34.0)
MCHC: 30.4 g/dL (ref 30.0–36.0)
MCV: 72.1 fL — ABNORMAL LOW (ref 80.0–100.0)
Platelets: 414 10*3/uL — ABNORMAL HIGH (ref 150–400)
RBC: 4.88 MIL/uL (ref 3.87–5.11)
RDW: 17.9 % — ABNORMAL HIGH (ref 11.5–15.5)
WBC: 5.4 10*3/uL (ref 4.0–10.5)
nRBC: 0 % (ref 0.0–0.2)

## 2020-01-23 MED ORDER — SACCHAROMYCES BOULARDII 250 MG PO CAPS
250.0000 mg | ORAL_CAPSULE | Freq: Two times a day (BID) | ORAL | Status: DC
  Administered 2020-01-23 – 2020-01-28 (×12): 250 mg via ORAL
  Filled 2020-01-23 (×15): qty 1

## 2020-01-23 MED ORDER — DOXYCYCLINE HYCLATE 100 MG PO TABS
100.0000 mg | ORAL_TABLET | Freq: Two times a day (BID) | ORAL | Status: DC
  Administered 2020-01-23 – 2020-01-29 (×13): 100 mg via ORAL
  Filled 2020-01-23 (×13): qty 1

## 2020-01-23 NOTE — Progress Notes (Signed)
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  After admission to the hospital, she was diagnosed with acute on chronic combinedsystolic and diastolic congestive heart failurewith ejection fraction 25 to 30%. She was not responding to IV Lasix. She was seen by nephrology, temporary dialysis catheter was performed and she was dialyzed on 10/17 and 10/18.Patient also complaining of abdominal pain, she had a paracentesis, study showed possible spontaneous peritonitis, she was treated with Rocephin and Flagyl.  Patient had a worsening respiratory status on 10/17, was diagnosed with aspiration pneumonia. She was treated with Rocephin, Zithromax and Flagyl.Patient also had abdominal pain and a CT scan suspect ischemia, but seen by general surgery on 10/19, no need for surgery.  Seen by DSS on 1/13.  Outcome pending.  Patient today with no complaints, although this evening she complains of some sinus tenderness as well as drainage and cough. Patient examined and she does have right maxillary tenderness.  01/23/20:  Seen and examined at her bedside.  Acute sinusitis on CT maxillofacial wo contrast started po doxixycline due to allergy to PCN.  Assessment/Plan: Principal Problem:   Spontaneous bacterial peritonitis ([place]) Active Problems:   AKI (acute kidney injury) ([place])   Crack cocaine use   Acute respiratory failure with hypoxia (HCC)   LV (left ventricular) mural thrombus   Chronic kidney disease (CKD) stage G3b/A1,  moderately decreased glomerular filtration rate (GFR) between 30-44 mL/min/1.73 square meter and albuminuria creatinine ratio less than 30 mg/g (HCC)   Nonsustained ventricular tachycardia (HCC)   Acute CHF (congestive heart failure) (HCC)   Cocaine abuse ([place])   Peripheral edema   Cirrhosis of liver (HCC)   Dementia (HCC)   Aspiration pneumonia of both lower lobes due to gastric secretions (HCC)   Acute diastolic (congestive) heart failure (HCC)   Cardiorenal disease   Goals of care, counseling/discussion   Palliative care by specialist   Ascites due to alcoholic cirrhosis ([place])  Acute on chronic combined systolic and diastolic congestive heart failure. Acute hypoxemic respiratory failure. -Echo from 06/22/2019 showed ejection fraction of 25 to 30% with grade 3 diastolic dysfunction.  She was dialyzed x2, but now getting diuresed. Pt stated she does not want to get dialysis again.  For now, continue on Aldactone and Bumex plus O2 at night as needed.  Acute sinusitis seen on CT maxillofacial, POA PCN allergy, started o po doxy 100 mg bID x 10 days Added florastor 250 mg bid x 14 days Continue flonase  Liver cirrhosis with ascites: Noted. GI consulted previously in hospitalization.  On cipro for SBP ppx  Acute renal failure on chronic kidney disease stage IV: Baseline cr appears to be 2.2 with GFR 24 Last Cr 3.02 with GFR 17 Continue to avoid nephrotoxins and hypotension  Chronic hypotension Continue midodrine to maintain MAP>65  Resolved Hyponatremia: As low as 128>> 137.  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  No evidence of active bleeding.  GERD:Continue PPI   Chronic constipation:cont.MiraLAX.  Disposition: Patient no longer meets criteria for SNF.  No family at  home to discharge to.  There is a question for her capacity and while she may not be able to have  an immediate plan to care for self, does not mean that she lacks capacity. Outcome of DSS assessment pending.  DVT prophylaxis: Eliquis Code Status: DNR Family Communication: No family Disposition Plan: Possible group home Consultants:   Nephrology  GI  Psych  Procedures:  Paracentesis  Antimicrobials:   Cipro    Status is: Inpatient   Dispo: The patient is from:home               Anticipated d/c is to: undetermined              Anticipated d/c date is: 01/26/20               Patient currently stable; pending placement.         Objective: Vitals:   01/22/20 1541 01/22/20 2102 01/23/20 0313 01/23/20 1319  BP: 116/74 (!) 125/100 113/88 105/86  Pulse: 63 76 92 68  Resp: 18 17 18 18   Temp: 97.8 F (36.6 C) 98 F (36.7 C) 98.1 F (36.7 C)   TempSrc:  Oral    SpO2: 98% 96% 95%   Weight:      Height:        Intake/Output Summary (Last 24 hours) at 01/23/2020 1408 Last data filed at 01/23/2020 1040 Gross per 24 hour  Intake 240 ml  Output -  Net 240 ml   Filed Weights   01/03/20 0451 01/05/20 0344 01/09/20 0456  Weight: 96.7 kg 95.3 kg 95.7 kg    Exam:  . General: 65 y.o. year-old female well developed well nourished in no acute distress.  Alert and oriented x3. . Cardiovascular: Regular rate and rhythm with no rubs or gallops.  No thyromegaly or JVD noted.   Marland Kitchen Respiratory: Clear to auscultation with no wheezes or rales. Good inspiratory effort. . Abdomen: Soft nontender nondistended with normal bowel sounds x4 quadrants. . Musculoskeletal: Trace lower extremity edema  Bilaterally. . Skin: No ulcerative lesions noted or rashes, . Psychiatry: Mood is appropriate for condition and setting   Data Reviewed: CBC: Recent Labs  Lab 01/18/20 0421 01/20/20 0324 01/23/20 0529  WBC 4.9 4.0 5.4  HGB 10.4* 9.8* 10.7*  HCT 34.0* 33.4* 35.2*  MCV 72.6* 72.8* 72.1*  PLT 372 382 993*   Basic Metabolic Panel: Recent Labs  Lab 01/20/20 0324  NA 137   K 4.4  CL 102  CO2 25  GLUCOSE 90  BUN 60*  CREATININE 3.02*  CALCIUM 9.6  MG 2.1   GFR: Estimated Creatinine Clearance: 22 mL/min (A) (by C-G formula based on SCr of 3.02 mg/dL (H)). Liver Function Tests: Recent Labs  Lab 01/20/20 0324  AST 26  ALT 11  ALKPHOS 129*  BILITOT 0.9  PROT 7.0  ALBUMIN 2.7*   No results for input(s): LIPASE, AMYLASE in the last 168 hours. No results for input(s): AMMONIA in the last 168 hours. Coagulation Profile: No results for input(s): INR, PROTIME in the last 168 hours. Cardiac Enzymes: No results for input(s): CKTOTAL, CKMB, CKMBINDEX, TROPONINI in the last 168 hours. BNP (last 3 results) No results for input(s): PROBNP in the last 8760 hours. HbA1C: No results for input(s): HGBA1C in the last 72 hours. CBG: No results for input(s): GLUCAP in the last 168 hours. Lipid Profile: No results for input(s): CHOL, HDL, LDLCALC, TRIG, CHOLHDL, LDLDIRECT in the last 72 hours. Thyroid Function Tests: No results for  input(s): TSH, T4TOTAL, FREET4, T3FREE, THYROIDAB in the last 72 hours. Anemia Panel: No results for input(s): VITAMINB12, FOLATE, FERRITIN, TIBC, IRON, RETICCTPCT in the last 72 hours. Urine analysis:    Component Value Date/Time   COLORURINE YELLOW (A) 07/27/2019 0836   APPEARANCEUR CLEAR (A) 07/27/2019 0836   LABSPEC 1.009 07/27/2019 0836   PHURINE 6.0 07/27/2019 0836   GLUCOSEU NEGATIVE 07/27/2019 0836   HGBUR NEGATIVE 07/27/2019 0836   BILIRUBINUR NEGATIVE 07/27/2019 0836   KETONESUR NEGATIVE 07/27/2019 0836   PROTEINUR NEGATIVE 07/27/2019 0836   NITRITE NEGATIVE 07/27/2019 0836   LEUKOCYTESUR NEGATIVE 07/27/2019 0836   Sepsis Labs: @LABRCNTIP (procalcitonin:4,lacticidven:4)  )No results found for this or any previous visit (from the past 240 hour(s)).    Studies: CT MAXILLOFACIAL WO CONTRAST  Result Date: 01/22/2020 CLINICAL DATA:  Facial pain and sinus drainage EXAM: CT MAXILLOFACIAL WITHOUT CONTRAST TECHNIQUE:  Multidetector CT imaging of the maxillofacial structures was performed. Multiplanar CT image reconstructions were also generated. COMPARISON:  None. FINDINGS: Osseous: No fracture or mandibular dislocation. No destructive process. Orbits: Negative. No traumatic or inflammatory finding. Sinuses: Moderate left maxillary sinus mucosal thickening. The other paranasal sinuses are clear. No mastoid effusion. Soft tissues: Negative. Limited intracranial: Normal IMPRESSION: Moderate left maxillary sinus mucosal thickening. Correlate for symptoms of acute sinusitis. Electronically Signed   By: Ulyses Jarred M.D.   On: 01/22/2020 23:02    Scheduled Meds: . apixaban  5 mg Oral BID  . bumetanide  2 mg Oral BID  . ciprofloxacin  500 mg Oral Q breakfast  . epoetin (EPOGEN/PROCRIT) injection  20,000 Units Subcutaneous Weekly  . fluticasone  2 spray Each Nare Daily  . guaiFENesin  600 mg Oral TID  . iron polysaccharides  150 mg Oral Daily  . ketotifen  1 drop Both Eyes BID  . loratadine  10 mg Oral Daily  . mouth rinse  15 mL Mouth Rinse BID  . melatonin  2.5 mg Oral QHS  . midodrine  10 mg Oral TID WC  . multivitamin  1 tablet Oral QHS  . pantoprazole  40 mg Oral BID  . polyethylene glycol  17 g Oral Daily  . spironolactone  25 mg Oral Daily    Continuous Infusions:   LOS: 96 days     Kayleen Memos, MD [facility] Pager [phone]  If 7PM-7AM, please contact night-coverage [email or web address] Password [facility] 01/23/2020, 2:08 PM

## 2020-01-24 DIAGNOSIS — K652 Spontaneous bacterial peritonitis: Secondary | ICD-10-CM | POA: Diagnosis not present

## 2020-01-24 NOTE — Progress Notes (Signed)
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  After admission to the hospital, she was diagnosed with acute on chronic combinedsystolic and diastolic congestive heart failurewith ejection fraction 25 to 30%. She was not responding to IV Lasix. She was seen by nephrology, temporary dialysis catheter was performed and she was dialyzed on 10/17 and 10/18.Patient also complaining of abdominal pain, she had a paracentesis, study showed possible spontaneous peritonitis, she was treated with Rocephin and Flagyl.  Patient had a worsening respiratory status on 10/17, was diagnosed with aspiration pneumonia. She was treated with Rocephin, Zithromax and Flagyl.Patient also had abdominal pain and a CT scan suspect ischemia, but seen by general surgery on 10/19, no need for surgery.  Seen by DSS on 1/13.  Outcome pending.  Patient today with no complaints, although this evening she complains of some sinus tenderness as well as drainage and cough. Patient examined and she does have right maxillary tenderness.  Acute sinusitis on CT maxillofacial wo contrast started po doxixycline due to allergy to PCN on 01/23/20.  01/24/20: Seen and examined at bedside this morning.  No acute events overnight.  She has no new complaints.  States her nasal congestion is improved..    Assessment/Plan: Principal Problem:   Spontaneous bacterial peritonitis ([place]) Active Problems:   AKI (acute kidney injury) ([place])   Crack cocaine use   Acute respiratory  failure with hypoxia (HCC)   LV (left ventricular) mural thrombus   Chronic kidney disease (CKD) stage G3b/A1, moderately decreased glomerular filtration rate (GFR) between 30-44 mL/min/1.73 square meter and albuminuria creatinine ratio less than 30 mg/g (HCC)   Nonsustained ventricular tachycardia (HCC)   Acute CHF (congestive heart failure) (HCC)   Cocaine abuse ([place])   Peripheral edema   Cirrhosis of liver (HCC)   Dementia (HCC)   Aspiration pneumonia of both lower lobes due to gastric secretions (HCC)   Acute diastolic (congestive) heart failure ([place])   Cardiorenal disease   Goals of care, counseling/discussion   Palliative care by specialist   Ascites due to alcoholic cirrhosis ([place])  Acute on chronic combined systolic and diastolic congestive heart failure. Acute hypoxemic respiratory failure. 2D Echo from 06/22/2019 showed ejection fraction of 25 to 30% with grade 3 diastolic dysfunction.  She was dialyzed x2. Pt stated she does not want to get dialysis again.  For now, continue on Aldactone plus O2 at night as needed.  Blood pressures have been soft, limiting IV diuresing.  Acute sinusitis seen on CT maxillofacial, POA PCN allergy, started on po doxy 100 mg bID x 10 days on 01/23/20 Continue florastor 250 mg bid x 14 days Continue flonase  Liver cirrhosis with ascites: Noted. GI consulted previously in hospitalization.  On cipro for SBP ppx  Acute renal failure on chronic kidney disease stage IV: Baseline cr appears to be 2.2 with GFR 24 Last Cr 3.02 with GFR 17 Continue to avoid nephrotoxins and hypotension Will repeat BMP in the AM  Chronic hypotension, BP is currently stable on midodrine. Continue midodrine to maintain MAP>65  Resolved Hyponatremia: As low as 128>> 137.  Left ventricular thrombus with history  of CVA.-cont Eliquis  Anemia of chronic kidney disease: Hemoglobin remained stable between 9-10.  No evidence of active bleeding.  GERD:Continue  PPI   Chronic constipation:cont.MiraLAX.  Disposition: Patient no longer meets criteria for SNF.  No family at home to discharge to.  There is a question for her capacity and while she may not be able to have an immediate plan to care for self, does not mean that she lacks capacity. Outcome of DSS assessment pending.  DVT prophylaxis: Eliquis Code Status: DNR Family Communication: No family Disposition Plan: Possible group home Consultants:   Nephrology  GI  Psych  Procedures:  Paracentesis  Antimicrobials:   Cipro    Status is: Inpatient   Dispo: The patient is from:home               Anticipated d/c is to: undetermined              Anticipated d/c date is: 01/26/20               Patient currently stable; pending placement.         Objective: Vitals:   01/23/20 2008 01/24/20 0453 01/24/20 0858 01/24/20 1233  BP: (!) 124/95 (!) 111/92 94/69 125/85  Pulse: 65 71 74 76  Resp: 20 16 17 18   Temp: 97.9 F (36.6 C) 97.8 F (36.6 C) 97.6 F (36.4 C) 97.6 F (36.4 C)  TempSrc: Oral  Oral Oral  SpO2: 100% 99% 95% 99%  Weight:      Height:        Intake/Output Summary (Last 24 hours) at 01/24/2020 1425 Last data filed at 01/24/2020 1411 Gross per 24 hour  Intake 420 ml  Output --  Net 420 ml   Filed Weights   01/03/20 0451 01/05/20 0344 01/09/20 0456  Weight: 96.7 kg 95.3 kg 95.7 kg    Exam:  . General: 65 y.o. year-old female well-developed well-nourished in no distress.  Alert and oriented x3. . Cardiovascular: Regular rate and rhythm no rubs gallops. Marland Kitchen Respiratory: Clear to auscultation no wheezes rales.   . Abdomen: Soft nontender normal bowel sounds present.  . Musculoskeletal: Trace lower extremity edema bilaterally. . Skin: No ulcerative lesions noted.   Marland Kitchen Psychiatry: Mood is appropriate for condition    Data Reviewed: CBC: Recent Labs  Lab 01/18/20 0421 01/20/20 0324 01/23/20 0529  WBC 4.9 4.0 5.4  HGB 10.4* 9.8* 10.7*  HCT  34.0* 33.4* 35.2*  MCV 72.6* 72.8* 72.1*  PLT 372 382 924*   Basic Metabolic Panel: Recent Labs  Lab 01/20/20 0324  NA 137  K 4.4  CL 102  CO2 25  GLUCOSE 90  BUN 60*  CREATININE 3.02*  CALCIUM 9.6  MG 2.1   GFR: Estimated Creatinine Clearance: 22 mL/min (A) (by C-G formula based on SCr of 3.02 mg/dL (H)). Liver Function Tests: Recent Labs  Lab 01/20/20 0324  AST 26  ALT 11  ALKPHOS 129*  BILITOT 0.9  PROT 7.0  ALBUMIN 2.7*   No results for input(s): LIPASE, AMYLASE in the last 168 hours. No results for input(s): AMMONIA in the last 168 hours. Coagulation Profile: No results for input(s): INR, PROTIME in the last 168 hours. Cardiac Enzymes: No results for input(s): CKTOTAL, CKMB, CKMBINDEX, TROPONINI in the last 168 hours. BNP (last 3 results) No results for input(s): PROBNP in the last 8760 hours. HbA1C: No results for input(s): HGBA1C in the last 72 hours. CBG: No results for input(s): GLUCAP in the last 168  hours. Lipid Profile: No results for input(s): CHOL, HDL, LDLCALC, TRIG, CHOLHDL, LDLDIRECT in the last 72 hours. Thyroid Function Tests: No results for input(s): TSH, T4TOTAL, FREET4, T3FREE, THYROIDAB in the last 72 hours. Anemia Panel: No results for input(s): VITAMINB12, FOLATE, FERRITIN, TIBC, IRON, RETICCTPCT in the last 72 hours. Urine analysis:    Component Value Date/Time   COLORURINE YELLOW (A) 07/27/2019 0836   APPEARANCEUR CLEAR (A) 07/27/2019 0836   LABSPEC 1.009 07/27/2019 0836   PHURINE 6.0 07/27/2019 0836   GLUCOSEU NEGATIVE 07/27/2019 0836   HGBUR NEGATIVE 07/27/2019 0836   BILIRUBINUR NEGATIVE 07/27/2019 0836   KETONESUR NEGATIVE 07/27/2019 0836   PROTEINUR NEGATIVE 07/27/2019 0836   NITRITE NEGATIVE 07/27/2019 0836   LEUKOCYTESUR NEGATIVE 07/27/2019 0836   Sepsis Labs: @LABRCNTIP (procalcitonin:4,lacticidven:4)  )No results found for this or any previous visit (from the past 240 hour(s)).    Studies: No results  found.  Scheduled Meds: . apixaban  5 mg Oral BID  . bumetanide  2 mg Oral BID  . ciprofloxacin  500 mg Oral Q breakfast  . doxycycline  100 mg Oral Q12H  . epoetin (EPOGEN/PROCRIT) injection  20,000 Units Subcutaneous Weekly  . fluticasone  2 spray Each Nare Daily  . guaiFENesin  600 mg Oral TID  . iron polysaccharides  150 mg Oral Daily  . ketotifen  1 drop Both Eyes BID  . loratadine  10 mg Oral Daily  . mouth rinse  15 mL Mouth Rinse BID  . melatonin  2.5 mg Oral QHS  . midodrine  10 mg Oral TID WC  . multivitamin  1 tablet Oral QHS  . pantoprazole  40 mg Oral BID  . polyethylene glycol  17 g Oral Daily  . saccharomyces boulardii  250 mg Oral BID  . spironolactone  25 mg Oral Daily    Continuous Infusions:   LOS: 97 days     Kayleen Memos, MD [facility] Pager [phone]  If 7PM-7AM, please contact night-coverage [email or web address] Password [email or web address] 01/24/2020, 2:25 PM

## 2020-01-25 DIAGNOSIS — K652 Spontaneous bacterial peritonitis: Secondary | ICD-10-CM | POA: Diagnosis not present

## 2020-01-25 LAB — BASIC METABOLIC PANEL
Anion gap: 10 (ref 5–15)
BUN: 62 mg/dL — ABNORMAL HIGH (ref 8–23)
CO2: 23 mmol/L (ref 22–32)
Calcium: 9.6 mg/dL (ref 8.9–10.3)
Chloride: 104 mmol/L (ref 98–111)
Creatinine, Ser: 3.14 mg/dL — ABNORMAL HIGH (ref 0.44–1.00)
GFR, Estimated: 16 mL/min — ABNORMAL LOW (ref >60)
Glucose, Bld: 117 mg/dL — ABNORMAL HIGH (ref 70–99)
Potassium: 3.9 mmol/L (ref 3.5–5.1)
Sodium: 137 mmol/L (ref 135–145)

## 2020-01-25 LAB — CBC
HCT: 33.7 % — ABNORMAL LOW (ref 36.0–46.0)
Hemoglobin: 10.2 g/dL — ABNORMAL LOW (ref 12.0–15.0)
MCH: 21.4 pg — ABNORMAL LOW (ref 26.0–34.0)
MCHC: 30.3 g/dL (ref 30.0–36.0)
MCV: 70.6 fL — ABNORMAL LOW (ref 80.0–100.0)
Platelets: 373 10*3/uL (ref 150–400)
RBC: 4.77 MIL/uL (ref 3.87–5.11)
RDW: 17.7 % — ABNORMAL HIGH (ref 11.5–15.5)
WBC: 4.4 10*3/uL (ref 4.0–10.5)
nRBC: 0 % (ref 0.0–0.2)

## 2020-01-25 NOTE — Progress Notes (Signed)
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  Found to have BNP greater than 4500, elevated troponin and AKI.  After admission to the hospital, she was diagnosed with acute on chronic combinedsystolic and diastolic congestive heart failurewith ejection fraction 25 to 30%. She was not responding to IV Lasix. She was seen by nephrology, temporary dialysis catheter was performed and she was dialyzed on 10/17 and 10/18.Patient also complaining of abdominal pain, she had a paracentesis, study showed possible spontaneous peritonitis, she was treated with Rocephin and Flagyl.  Patient had a worsening respiratory status on 10/17, was diagnosed with aspiration pneumonia. She was treated with Rocephin, Zithromax and Flagyl.Patient also had abdominal pain and a CT scan suspect ischemia, but seen by general surgery on 10/19, no need for surgery.  Seen by DSS on 1/13.  Outcome pending.  Complains of some sinus tenderness as well as drainage and cough. Patient examined, she does have right maxillary tenderness.  CT maxillofacial wo contrast showed evidence of acute sinusitis, started on po doxycycline due to allergy to PCN on 01/23/20.  She is on Cipro for SBP prophylaxis.  01/25/20: Seen and examined at bedside this morning.  There were no acute events overnight.  She has no new complaints.   Assessment/Plan: Principal Problem:   Spontaneous bacterial peritonitis ([place]) Active Problems:   AKI (acute kidney injury) ([place])   Crack cocaine use   Acute respiratory failure with hypoxia (HCC)   LV  (left ventricular) mural thrombus   Chronic kidney disease (CKD) stage G3b/A1, moderately decreased glomerular filtration rate (GFR) between 30-44 mL/min/1.73 square meter and albuminuria creatinine ratio less than 30 mg/g (HCC)   Nonsustained ventricular tachycardia (HCC)   Acute CHF (congestive heart failure) (HCC)   Cocaine abuse ([place])   Peripheral edema   Cirrhosis of liver (HCC)   Dementia (HCC)   Aspiration pneumonia of both lower lobes due to gastric secretions (HCC)   Acute diastolic (congestive) heart failure (HCC)   Cardiorenal disease   Goals of care, counseling/discussion   Palliative care by specialist   Ascites due to alcoholic cirrhosis ([place])  Acute on chronic combined systolic and diastolic congestive heart failure. Acute hypoxemic respiratory failure. 2D Echo from 06/22/2019 showed ejection fraction of 25 to 30% with grade 3 diastolic dysfunction.  She was dialyzed x2. Pt stated she does not want to get dialysis again.  For now, continue on Aldactone plus O2 at night as needed.  Blood pressures have been soft, limiting aggressive diuresing.  Worsening acute renal failure on chronic kidney disease stage IV: Baseline cr appears to be 2.2 with GFR 24 Last Cr 3.14 from 3.02 with GFR 16 Continue to avoid nephrotoxins and hypotension She was seen by nephrology, temporary dialysis catheter was performed and she was dialyzed on 10/17 and 10/18. Monitor urine output, not recorded She will need nephrology follow-up  Anemia of chronic kidney disease in the setting of advanced CKD/iron deficiency anemia:  Hemoglobin stable 10.2 No overt bleeding Continue iron supplement and weekly Epogen injections  Improving acute sinusitis seen on CT maxillofacial, POA PCN allergy, started on po doxy 100 mg bID x 10 days on 01/23/20 Continue florastor 250 mg  bid x 14 days Continue flonase as needed  Liver cirrhosis with ascites: Noted. GI consulted previously in hospitalization.    Currently on cipro for SBP ppx  Chronic hypotension, BP is currently stable on midodrine. Continue midodrine to maintain MAP>65  Resolved Hyponatremia: As low as 128>> 137.  Left ventricular thrombus with history of CVA.-cont Eliquis  GERD:Continue PPI   Chronic constipation:cont.MiraLAX.  Disposition: Patient no longer meets criteria for SNF.  No family at home to discharge to.  There is a question for her capacity and while she may not be able to have an immediate plan to care for self, does not mean that she lacks capacity. Outcome of DSS assessment pending.  DVT prophylaxis: Eliquis Code Status: DNR Family Communication: No family Disposition Plan: Possible group home Consultants:   Nephrology  GI  Psych  Procedures:  Paracentesis  Antimicrobials:   Cipro    Status is: Inpatient   Dispo: The patient is from:home               Anticipated d/c is to: undetermined              Anticipated d/c date is: 01/26/20               Patient currently stable; pending placement.         Objective: Vitals:   01/24/20 2316 01/25/20 0623 01/25/20 0822 01/25/20 1600  BP: (!) 123/99  95/70 101/78  Pulse: 91  68 78  Resp: 18  16 17   Temp: 97.6 F (36.4 C)  97.8 F (36.6 C) 97.6 F (36.4 C)  TempSrc:   Oral Oral  SpO2: 100%  100% 100%  Weight:  84.5 kg    Height:        Intake/Output Summary (Last 24 hours) at 01/25/2020 1630 Last data filed at 01/25/2020 1416 Gross per 24 hour  Intake 420 ml  Output --  Net 420 ml   Filed Weights   01/05/20 0344 01/09/20 0456 01/25/20 0623  Weight: 95.3 kg 95.7 kg 84.5 kg    Exam:  . General: 65 y.o. year-old female with appropriate motion and no acute distress.  Alert and oriented x3.   . Cardiovascular: Regular rate and rhythm no rubs or gallops.  Respiratory: Clear to auscultation no wheezes or rales. . Abdomen: Soft nontender normal bowel sounds present. . Musculoskeletal: Trace lower extremity  edema bilaterally.   . Skin: No ulcerative lesions noted. Marland Kitchen Psychiatry: Mood is appropriate to condition and setting.   Data Reviewed: CBC: Recent Labs  Lab 01/20/20 0324 01/23/20 0529 01/25/20 0558  WBC 4.0 5.4 4.4  HGB 9.8* 10.7* 10.2*  HCT 33.4* 35.2* 33.7*  MCV 72.8* 72.1* 70.6*  PLT 382 414* 272   Basic Metabolic Panel: Recent Labs  Lab 01/20/20 0324 01/25/20 0534  NA 137 137  K 4.4 3.9  CL 102 104  CO2 25 23  GLUCOSE 90 117*  BUN 60* 62*  CREATININE 3.02* 3.14*  CALCIUM 9.6 9.6  MG 2.1  --    GFR: Estimated Creatinine Clearance: 19.8 mL/min (A) (by C-G formula based on SCr of 3.14 mg/dL (H)). Liver Function Tests: Recent Labs  Lab 01/20/20 0324  AST 26  ALT 11  ALKPHOS 129*  BILITOT 0.9  PROT 7.0  ALBUMIN 2.7*   No results for input(s): LIPASE, AMYLASE in the last 168 hours. No results for input(s): AMMONIA in the last 168 hours. Coagulation Profile: No results for input(s): INR, PROTIME in the last  168 hours. Cardiac Enzymes: No results for input(s): CKTOTAL, CKMB, CKMBINDEX, TROPONINI in the last 168 hours. BNP (last 3 results) No results for input(s): PROBNP in the last 8760 hours. HbA1C: No results for input(s): HGBA1C in the last 72 hours. CBG: No results for input(s): GLUCAP in the last 168 hours. Lipid Profile: No results for input(s): CHOL, HDL, LDLCALC, TRIG, CHOLHDL, LDLDIRECT in the last 72 hours. Thyroid Function Tests: No results for input(s): TSH, T4TOTAL, FREET4, T3FREE, THYROIDAB in the last 72 hours. Anemia Panel: No results for input(s): VITAMINB12, FOLATE, FERRITIN, TIBC, IRON, RETICCTPCT in the last 72 hours. Urine analysis:    Component Value Date/Time   COLORURINE YELLOW (A) 07/27/2019 0836   APPEARANCEUR CLEAR (A) 07/27/2019 0836   LABSPEC 1.009 07/27/2019 0836   PHURINE 6.0 07/27/2019 0836   GLUCOSEU NEGATIVE 07/27/2019 0836   HGBUR NEGATIVE 07/27/2019 0836   BILIRUBINUR NEGATIVE 07/27/2019 0836   KETONESUR  NEGATIVE 07/27/2019 0836   PROTEINUR NEGATIVE 07/27/2019 0836   NITRITE NEGATIVE 07/27/2019 0836   LEUKOCYTESUR NEGATIVE 07/27/2019 0836   Sepsis Labs: @LABRCNTIP (procalcitonin:4,lacticidven:4)  )No results found for this or any previous visit (from the past 240 hour(s)).    Studies: No results found.  Scheduled Meds: . apixaban  5 mg Oral BID  . bumetanide  2 mg Oral BID  . ciprofloxacin  500 mg Oral Q breakfast  . doxycycline  100 mg Oral Q12H  . epoetin (EPOGEN/PROCRIT) injection  20,000 Units Subcutaneous Weekly  . fluticasone  2 spray Each Nare Daily  . guaiFENesin  600 mg Oral TID  . iron polysaccharides  150 mg Oral Daily  . ketotifen  1 drop Both Eyes BID  . loratadine  10 mg Oral Daily  . mouth rinse  15 mL Mouth Rinse BID  . melatonin  2.5 mg Oral QHS  . midodrine  10 mg Oral TID WC  . multivitamin  1 tablet Oral QHS  . pantoprazole  40 mg Oral BID  . polyethylene glycol  17 g Oral Daily  . saccharomyces boulardii  250 mg Oral BID  . spironolactone  25 mg Oral Daily    Continuous Infusions:   LOS: 98 days     Kayleen Memos, MD [facility] Pager [phone]  If 7PM-7AM, please contact night-coverage [email or web address] Password [facility] 01/25/2020, 4:30 PM

## 2020-01-25 NOTE — Progress Notes (Signed)
Nutrition Follow-up  RD working remotely.  DOCUMENTATION CODES:   Obesity unspecified  INTERVENTION:  Patient continues to decline all nutrition interventions recommended by RD.  Continue Rena-vit po QHS.  NUTRITION DIAGNOSIS:   Increased nutrient needs related to chronic illness (cirrhosis, CHF) as evidenced by estimated needs.  Ongoing.  GOAL:   Patient will meet greater than or equal to 90% of their needs  Progressing.  MONITOR:   PO intake,Supplement acceptance,Labs,Weight trends,Skin,I & O's  REASON FOR ASSESSMENT:   LOS    ASSESSMENT:   65 y.o. 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  Spoke with patient over the phone. She reports her appetite is good and she continues to eat 100% of her meals. She reports she is getting tired of the food, especially how the protein is cooked so she has not been ordering as much protein. RD once again discussed importance of adequate protein intake. Patient refuses RD's offered interventions of oral nutrition supplements, snacks, or education on other foods that are good sources of protein.  Medications reviewed and include: Cipro, doxycycline, Epogen 20000 units weekly, Rena-vit QHS, Protonix, Miralax, Florastor 250 mg BID, spironolactone.  Labs reviewed: BUN 62, Creatinine 3.14.  I/O: no UOP documented yesterday  Weight trend: 84.5 kg on 1/17; wts fluctuate significantly during admission so unsure how accurate they are; this is -11.2 kg from 1/1  Diet Order:   Diet Order            Diet Heart Room service appropriate? Yes; Fluid consistency: Thin  Diet effective now           Diet - low sodium heart healthy                EDUCATION NEEDS:   Education needs have been  addressed  Skin:  Skin Assessment: Skin Integrity Issues: Skin Integrity Issues:: Incisions Incisions: closed incision lower abdomen  Last BM:  01/23/2020 per chart  Height:   Ht Readings from Last 1 Encounters:  11/13/19 5\' 6"  (1.676 m)   Weight:   Wt Readings from Last 1 Encounters:  01/25/20 84.5 kg   Ideal Body Weight:  59 kg  BMI:  Body mass index is 30.07 kg/m.  Estimated Nutritional Needs:   Kcal:  1900-2200kcal/day  Protein:  95-110g/day  Fluid:  1.8-2L/day  Jacklynn Barnacle, MS, RD, LDN Pager number available on Amion

## 2020-01-26 LAB — CBC
HCT: 35.3 % — ABNORMAL LOW (ref 36.0–46.0)
Hemoglobin: 10.8 g/dL — ABNORMAL LOW (ref 12.0–15.0)
MCH: 21.8 pg — ABNORMAL LOW (ref 26.0–34.0)
MCHC: 30.6 g/dL (ref 30.0–36.0)
MCV: 71.3 fL — ABNORMAL LOW (ref 80.0–100.0)
Platelets: 382 10*3/uL (ref 150–400)
RBC: 4.95 MIL/uL (ref 3.87–5.11)
RDW: 17.6 % — ABNORMAL HIGH (ref 11.5–15.5)
WBC: 4.5 10*3/uL (ref 4.0–10.5)
nRBC: 0 % (ref 0.0–0.2)

## 2020-01-26 LAB — BASIC METABOLIC PANEL
Anion gap: 13 (ref 5–15)
BUN: 61 mg/dL — ABNORMAL HIGH (ref 8–23)
CO2: 24 mmol/L (ref 22–32)
Calcium: 9.9 mg/dL (ref 8.9–10.3)
Chloride: 102 mmol/L (ref 98–111)
Creatinine, Ser: 3.35 mg/dL — ABNORMAL HIGH (ref 0.44–1.00)
GFR, Estimated: 15 mL/min — ABNORMAL LOW (ref >60)
Glucose, Bld: 95 mg/dL (ref 70–99)
Potassium: 4.6 mmol/L (ref 3.5–5.1)
Sodium: 139 mmol/L (ref 135–145)

## 2020-01-26 NOTE — Progress Notes (Signed)
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  Found to have BNP greater than 4500, elevated troponin and AKI.  After admission to the hospital, she was diagnosed with acute on chronic combinedsystolic and diastolic congestive heart failurewith ejection fraction 25 to 30%. She was not responding to IV Lasix. She was seen by nephrology, temporary dialysis catheter was performed and she was dialyzed on 10/17 and 10/18.Patient also complaining of abdominal pain, she had a paracentesis, study showed possible spontaneous peritonitis, she was treated with Rocephin and Flagyl.  Patient had a worsening respiratory status on 10/17, was diagnosed with aspiration pneumonia. She was treated with Rocephin, Zithromax and Flagyl.Patient also had abdominal pain and a CT scan suspect ischemia, but seen by general surgery on 10/19, no need for surgery.Seen by DSS on 1/13. Outcome pending.  Complains of some sinus tenderness as well as drainage and cough. Patient examined, she does have right maxillary tenderness.  CT maxillofacial wo contrast showed evidence of acute sinusitis, started on po doxycycline due to allergy to PCN on 01/23/20.  She is on Cipro for SBP prophylaxis.  Interval History:  No events overnight.  Comfortably resting in bed in no distress this morning.  Breathing also comfortable.  Denies chest pain.  Old records reviewed in assessment of this patient  ROS: Constitutional: negative for chills and fevers, Respiratory: negative for cough, Cardiovascular: negative  for chest pain and Gastrointestinal: negative for abdominal pain  Assessment & Plan: Acute on chronic combined systolic and diastolic congestive heart failure. Acute hypoxemic respiratory failure. 2D Echo from 06/22/2019 showed ejection fraction of 25 to 30% with grade 3 diastolic dysfunction. She was dialyzed x2. Ptstated shedoes not want to get dialysis again.For now, continue on Aldactone plus O2 at night as needed.  BP consistent with typical cirrhotic   Acute renal failure on chronic kidney disease stage IV - etiology HRS vs CRS vs combo Baseline cr appears to be possibly 2.2 Last Cr 3.14 from 3.02 with GFR 16 Continue to avoid nephrotoxins and hypotension She was seen by nephrology, temporary dialysis catheter was performed and she was dialyzed on 10/17 and 10/18. She will need nephrology follow-up  Anemia of chronic kidney disease in the setting of advanced CKD/iron deficiency anemia:  Hemoglobin stable 10.2 No overt bleeding Continue iron supplement and weekly Epogen injections  Multisystem organ disease (cardiac, renal, hepatic) - prognosis very guarded to poor long term especially if any deviation of her medications and/or further cocaine use - patient already declining further HD? Not good for her prognosis either long term if needed - will need to keep Katelyn Lane discussions ongoing outpatient - currently DNR  Improving acute sinusitis seen on CT maxillofacial, POA PCN allergy, started on po doxy 100 mg BID x 10 days on 01/23/20 Continue florastor 250 mg bid x 14 days Continue flonase as needed  Liver cirrhosis with ascites: Noted. GI consulted previously in hospitalization.  Currently oncipro for SBP ppx  Chronic hypotension Continue midodrine - expected BP in her setting is 90s/60s with underlying cirrhosis  Hyponatremia:resolved  -Further hyponatremia not unexpected in setting of ongoing cirrhosis  Left ventricular thrombus with history of CVA.-cont  Eliquis  GERD:Continue PPI   Chronic constipation:cont.MiraLAX.   DVT prophylaxis: Eliquis Code Status: DNR Family Communication: None present Disposition Plan: Status is: Inpatient  Remains inpatient appropriate because:Unsafe d/c plan and Inpatient level of care appropriate due to severity of illness   Dispo:  Patient From: Other  Planned Disposition: Group Home  Expected discharge date: When bed available  Medically stable for discharge: Yes   Objective: Blood pressure (!) 118/93, pulse 73, temperature 98.4 F (36.9 C), temperature source Oral, resp. rate 16, height '5\' 6"'$  (1.676 m), weight 84.5 kg, SpO2 100 %.  Examination: General appearance: alert, cooperative and no distress Head: Normocephalic, without obvious abnormality, atraumatic Eyes: EOMI Lungs: clear to auscultation bilaterally Heart: irregularly irregular rhythm, S1, S2 normal and 3/6 HSM heard throughout precordium Abdomen: normal findings: bowel sounds normal and soft, non-tender Extremities: No lower extremity edema Skin: mobility and turgor normal Neurologic: Grossly normal  Consultants:     Procedures:     Data Reviewed: I have personally reviewed following labs and imaging studies Results for orders placed or performed during the hospital encounter of 10/19/19 (from the past 24 hour(s))  Basic metabolic panel     Status: Abnormal   Collection Time: 01/26/20  5:22 AM  Result Value Ref Range   Sodium 139 135 - 145 mmol/L   Potassium 4.6 3.5 - 5.1 mmol/L   Chloride 102 98 - 111 mmol/L   CO2 24 22 - 32 mmol/L   Glucose, Bld 95 70 - 99 mg/dL   BUN 61 (H) 8 - 23 mg/dL   Creatinine, Ser 3.35 (H) 0.44 - 1.00 mg/dL   Calcium 9.9 8.9 - 10.3 mg/dL   GFR, Estimated 15 (L) >60 mL/min   Anion gap 13 5 - 15  CBC     Status: Abnormal   Collection Time: 01/26/20  5:22 AM  Result Value Ref Range   WBC 4.5 4.0 - 10.5 K/uL   RBC 4.95 3.87 - 5.11 MIL/uL   Hemoglobin 10.8 (L) 12.0 - 15.0 g/dL    HCT 35.3 (L) 36.0 - 46.0 %   MCV 71.3 (L) 80.0 - 100.0 fL   MCH 21.8 (L) 26.0 - 34.0 pg   MCHC 30.6 30.0 - 36.0 g/dL   RDW 17.6 (H) 11.5 - 15.5 %   Platelets 382 150 - 400 K/uL   nRBC 0.0 0.0 - 0.2 %    No results found for this or any previous visit (from the past 240 hour(s)).   Radiology Studies: No results found. CT MAXILLOFACIAL WO CONTRAST  Final Result    US Paracentesis  Final Result    DG Chest Port 1 View  Final Result    US Paracentesis  Final Result    [name] ASCITES (ABDOMEN LIMITED)  Final Result    US Paracentesis  Final Result    [name] ASCITES (ABDOMEN LIMITED)  Final Result    DG Chest Port 1 View  Final Result    DG Chest Port 1 View  Final Result    US Paracentesis  Final Result    US Paracentesis  Final Result    CT ABDOMEN PELVIS WO CONTRAST  Final Result    DG Abd 1 View  Final Result    US Paracentesis  Final Result    DG ABD ACUTE 2+V W 1V CHEST  Final Result    DG Chest Port 1 View  Final Result    US Abdomen Complete  Final Result    US RENAL  Final Result    DG Chest 2 View  Final Result      Scheduled Meds: . apixaban  5 mg Oral BID  . bumetanide  2 mg Oral BID  . ciprofloxacin  500 mg Oral Q breakfast  . doxycycline  100 mg Oral Q12H  . epoetin (EPOGEN/PROCRIT) injection  20,000 Units Subcutaneous Weekly  . fluticasone  2 spray Each Nare Daily  . guaiFENesin  600 mg Oral TID  . iron polysaccharides  150 mg Oral Daily  . ketotifen  1 drop Both Eyes BID  . loratadine  10 mg Oral Daily  . mouth rinse  15 mL Mouth Rinse BID  . melatonin  2.5 mg Oral QHS  . midodrine  10 mg Oral TID WC  . multivitamin  1 tablet Oral QHS  . pantoprazole  40 mg Oral BID  . polyethylene glycol  17 g Oral Daily  . saccharomyces boulardii  250 mg Oral BID  . spironolactone  25 mg Oral Daily   PRN Meds: acetaminophen, albuterol, alum & mag hydroxide-simeth **AND** lidocaine, bisacodyl, calcium carbonate, hydrocortisone cream,  hydrOXYzine, loperamide, ondansetron (ZOFRAN) IV, polyvinyl alcohol, sodium chloride, traMADol, white petrolatum Continuous Infusions:   LOS: 99 days  Time spent: Greater than 50% of the 35 minute visit was spent in counseling/coordination of care for the patient as laid out in the A&P.   Dwyane Dee, MD [facility] 01/26/2020, 5:01 PM

## 2020-01-26 NOTE — TOC Progression Note (Addendum)
Transition of Care [facility]) - Progression Note    Patient Details  Name: Katelyn Lane MRN: [id number] Date of Birth: 1955-11-08  Transition [facility] Care [facility]) CM/SW Oretta, RN Phone Number: 01/26/2020, 10:41 AM  Clinical Narrative:    Call to [place], [place] [phone] requesting update on guardianship assessment. LVMM.  Call to [place] @ [phone] no answer and VM full.   Spoke to [place] @ DSS confirmed based on assessment completed during hospitalization patient does appear to have competency/Capacity. DSS is planning to complete MDE as outpatient. RN CM discussed potential for patient to discharge to community as she has refused previous group home offers.   Spoke to [employer] @ [phone] regarding potential transitional care home. Confirmed he has one place move in ready that is located in [place]. RN CM will talk with patient and discuss options for discharge.         Expected Discharge Plan and Services           Expected Discharge Date: 01/08/20                                     Social Determinants of Health (SDOH) Interventions    Readmission Risk Interventions Readmission Risk Prevention Plan 09/02/2019 08/14/2019 07/27/2019  Transportation Screening Complete Complete Complete  Medication Review [employer]) Complete Referral to Pharmacy -  PCP or Specialist appointment within 3-5 days of discharge Complete Complete Complete  HRI or Home Care Consult - Complete Complete  SW Recovery Care/Counseling Consult - Complete Complete  Palliative Care Screening Not Applicable Not Applicable Not Applicable  Skilled Nursing Facility Complete Not Applicable Not Applicable  Some recent data might be hidden

## 2020-01-26 NOTE — TOC Progression Note (Signed)
Transition of Care [facility]) - Progression Note    Patient Details  Name: Katelyn Lane MRN: [id number] Date of Birth: 1955-03-31  Transition of Care [facility]) CM/SW Contact  Anselm Pancoast, RN Phone Number: 01/26/2020, 5:16 PM  Clinical Narrative:    Spoke with patient at bedside. Discussed offer to transitional care home offer in [place] and [facility] availability with [facility] in [place]. Patient refused both and became very angry stating "I am sick of nobody in this hospital doing anything to help me". Patient went on to report she would leave right now although she can not name any support person or place able to assist. Patient states she talked to a "friend" who told her she spent 3 weeks in the ED at [facility] and was placed with [employer]. RN CM offered to outreach to [employer] and again patient became very angry and persistent stating she would take care of it and everyone else needed to mind their business. RN CM attempted to explain that patient had been offered safe discharge housing and continued to refuse. Patient asked RN CM to leave the room stating she was getting upset and did not want to talk about it anymore. Writer advised patient would be back tomorrow to discuss discharge plan.         Expected Discharge Plan and Services           Expected Discharge Date: 01/08/20                                     Social Determinants of Health (SDOH) Interventions    Readmission Risk Interventions Readmission Risk Prevention Plan 09/02/2019 08/14/2019 07/27/2019  Transportation Screening Complete Complete Complete  Medication Review Press photographer) Complete Referral to Pharmacy -  PCP or Specialist appointment within 3-5 days of discharge Complete Complete Complete  HRI or Home Care Consult - Complete Complete  SW Recovery Care/Counseling Consult - Complete Complete  Palliative Care Screening Not Applicable Not Applicable Not Applicable  Skilled  Nursing Facility Complete Not Applicable Not Applicable  Some recent data might be hidden

## 2020-01-26 NOTE — Progress Notes (Signed)
   01/26/20 1415  Clinical Encounter Type  Visited With Patient  Referral From Nurse  Consult/Referral To Chaplain  While routine rounding I visited room 146 A. Pt, [name] was sleeping. Her room door was open so I quietly stepped in and prayed. When exiting the room the nursed asked, "how is she doing?  I said, "she is sleeping but I will come back and visit her another day."

## 2020-01-27 ENCOUNTER — Inpatient Hospital Stay: Payer: Medicaid Other

## 2020-01-27 LAB — BODY FLUID CELL COUNT WITH DIFFERENTIAL
Eos, Fluid: 0 %
Lymphs, Fluid: 5 %
Monocyte-Macrophage-Serous Fluid: 93 %
Neutrophil Count, Fluid: 2 %
Other Cells, Fluid: 0 %
Total Nucleated Cell Count, Fluid: 138 cu mm

## 2020-01-27 NOTE — Progress Notes (Addendum)
This nurse attempted to walk pt on RA, and pt stated she does not need O2 at d/c. Pt also stated she walked 3x around the unit this am without O2, stated she did not feel SOB nor did she need to rest  Pt called nurse in to walk with her. At rest on RA pt sats were 100%  Pts O2 with ambulation on RA was 98%

## 2020-01-27 NOTE — TOC Progression Note (Addendum)
Transition of Care [facility]) - Progression Note    Patient Details  Name: Nakari Sarao MRN: [id number] Date of Birth: [birth year]  Transition of Care [facility]) CM/SW Contact  Anselm Pancoast, RN Phone Number: 01/27/2020, 10:43 AM  Clinical Narrative:     Call to [facility] @ [phone] requesting [place] availability. States no availability at this time but possibility of opening. Agreed to takae RN CM number and call back.   Call to [facility] @ [phone]: LVMM for request of [place] opening. Received call back-no availability.  Call to Ileene Musa @ [facility]: requested information be faxed to [phone] for review. RN CM faxed over information.         Expected Discharge Plan and Services           Expected Discharge Date: 01/08/20                                     Social Determinants of Health (SDOH) Interventions    Readmission Risk Interventions Readmission Risk Prevention Plan 09/02/2019 08/14/2019 07/27/2019  Transportation Screening Complete Complete Complete  Medication Review [employer] photographer) Complete Referral to Pharmacy -  PCP or Specialist appointment within 3-5 days of discharge Complete Complete Complete  HRI or Home Care Consult - Complete Complete  SW Recovery Care/Counseling Consult - Complete Complete  Palliative Care Screening Not Applicable Not Applicable Not Applicable  Skilled Nursing Facility Complete Not Applicable Not Applicable  Some recent data might be hidden

## 2020-01-27 NOTE — Progress Notes (Signed)
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  Found to have BNP greater than 4500, elevated troponin and AKI.  After admission to the hospital, she was diagnosed with acute on chronic combinedsystolic and diastolic congestive heart failurewith ejection fraction 25 to 30%. She was not responding to IV Lasix. She was seen by nephrology, temporary dialysis catheter was performed and she was dialyzed on 10/17 and 10/18.Patient also complaining of abdominal pain, she had a paracentesis, study showed possible spontaneous peritonitis, she was treated with Rocephin and Flagyl.  Patient had a worsening respiratory status on 10/17, was diagnosed with aspiration pneumonia. She was treated with Rocephin, Zithromax and Flagyl.Patient also had abdominal pain and a CT scan suspect ischemia, but seen by general surgery on 10/19, no need for surgery.Seen by DSS on 1/13. Outcome pending.  Complains of some sinus tenderness as well as drainage and cough. Patient examined, she does have right maxillary tenderness.  CT maxillofacial wo contrast showed evidence of acute sinusitis, started on po doxycycline due to allergy to PCN on 01/23/20.  She is on Cipro for SBP prophylaxis.  Interval History:  No events overnight.  Sitting on edge of bed attempting to eat lunch today.  Upon walking in she was telling me that her abdomen felt more distended and sore compared to usual.  She appears to undergo repeat paracentesis approximately every 2 to 3 weeks.  Last paracentesis was on 01/11/2020. We  also discussed disposition from the hospital; it appears that she has had some offers for places to go however continues to refuse placement with several excuses such as distance away and that she does not "know of these places".  I discussed what she thinks would happen if she turned down all offered places and was still discharged from the hospital; she did state that she knew she would be homeless but also did not want this either and pulled out her notebook showing me that she has been looking into places to live and actively calling them. However, she is also very slightly particular and noncompliant such as declining ambulatory pulse ox check today stating that "I do not need oxygen", then later calling nurse back into room to ambulate her when convenient for the patient.  Old records reviewed in assessment of this patient  ROS: Constitutional: negative for chills and fevers, Respiratory: negative for cough, Cardiovascular: negative for chest pain and Gastrointestinal: negative for abdominal pain  Assessment & Plan: Acute on chronic combined systolic and diastolic congestive heart failure. Acute hypoxemic respiratory failure. 2D Echo from 06/22/2019 showed ejection fraction of 25 to 30% with grade 3 diastolic dysfunction. She was dialyzed x2. Ptstated shedoes not want to get dialysis again.For now, continue on Aldactone plus O2 at night as needed.  BP consistent with typical cirrhotic   Acute renal failure on chronic kidney disease stage IV - etiology HRS vs CRS vs combo Baseline cr appears to be possibly 2.2 Last Cr 3.14 from 3.02 with GFR 16 Continue to avoid nephrotoxins and hypotension She was seen by nephrology, temporary dialysis catheter was performed and she was dialyzed on 10/17 and  10/18. She will need nephrology follow-up  Complex social situation - patient is feasibly homeless; now in hospital 100 days; she has turned down offered venues for discharge (group home and  transitional home, and others). Can give some reasons why she won't go but appears to lack insight as to the validity of such reason (such as "it's too far away" or "I don't know that place"). When I confront her on what that means if she refuses places yet is discharged, she does agree that she would be homeless on the street and states she does not want that option either. She has a notebook she showed me of places she's been supposedly calling and waiting to find out more about availability; in the meantime SW/CM has been assisting options as well - for now, continuing to await further input from both patient and SW/CM - per psych, she lacks capacity for taking care of herself independently; as for decision making capacity that is still to be determined as we are awaiting further options to truly present to the patient   Anemia of chronic kidney disease in the setting of advanced CKD/iron deficiency anemia:  Hemoglobin stable 10.2 No overt bleeding Continue iron supplement and weekly Epogen injections  Multisystem organ disease (cardiac, renal, hepatic) - prognosis very guarded to poor long term especially if any deviation of her medications and/or further cocaine/alcohol use - patient already declining further HD? Not good for her prognosis either long term if needed - will need to keep [place] discussions ongoing outpatient - currently DNR  Improving acute sinusitis seen on CT maxillofacial, POA PCN allergy, started on po doxy 100 mg BID x 10 days on 01/23/20 Continue florastor 250 mg bid x 14 days Continue flonase as needed  Liver cirrhosis with ascites: Noted. GI consulted previously in hospitalization.  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   DVT prophylaxis: Eliquis Code Status: DNR Family Communication: None present Disposition Plan: Status is: Inpatient  Remains inpatient appropriate because:Unsafe d/c plan and Inpatient level of care appropriate due to severity of illness   Dispo:  Patient From: Other  Planned Disposition: Group Home or similar   Expected discharge date: when place found  Medically stable for discharge: Yes   Objective: Blood pressure 116/71, pulse 64, temperature (!) 97.4 F (36.3 C), resp. rate 16, height '5\' 6"'$  (1.676 m), weight 84.5 kg, SpO2 100 %.  Examination: General appearance: alert, cooperative and no distress Head: Normocephalic, without obvious abnormality, atraumatic Eyes: EOMI Lungs: clear to auscultation bilaterally Heart: irregularly irregular rhythm, S1, S2 normal and 3/6 HSM heard throughout precordium Abdomen: slightly distended and obese; BS present; NT Extremities: No lower extremity edema Skin: mobility and turgor normal Neurologic: Grossly normal  Consultants:     Procedures:     Data Reviewed: I have personally reviewed following labs and imaging studies No results found for this or any previous visit (from the past 24 hour(s)).  No results found for this or any previous visit (from the past 240 hour(s)).   Radiology Studies: No results found. CT MAXILLOFACIAL WO CONTRAST  Final Result    US Paracentesis  Final Result    DG Chest Port 1 View  Final Result    US Paracentesis  Final  Result    [name] ASCITES (ABDOMEN LIMITED)  Final Result    US Paracentesis  Final Result    [name] ASCITES (ABDOMEN LIMITED)  Final Result    DG Chest Port 1 View  Final Result    DG Chest Port 1 View  Final Result    US Paracentesis  Final Result    US Paracentesis  Final Result    CT ABDOMEN PELVIS WO CONTRAST  Final Result    DG Abd 1 View  Final Result    US  Paracentesis  Final Result    DG ABD ACUTE 2+V W 1V CHEST  Final Result    DG Chest Port 1 View  Final Result    US Abdomen Complete  Final Result    US RENAL  Final Result    DG Chest 2 View  Final Result    US Paracentesis    (Results Pending)    Scheduled Meds: . apixaban  5 mg Oral BID  . bumetanide  2 mg Oral BID  . ciprofloxacin  500 mg Oral Q breakfast  . doxycycline  100 mg Oral Q12H  . epoetin (EPOGEN/PROCRIT) injection  20,000 Units Subcutaneous Weekly  . fluticasone  2 spray Each Nare Daily  . guaiFENesin  600 mg Oral TID  . iron polysaccharides  150 mg Oral Daily  . ketotifen  1 drop Both Eyes BID  . loratadine  10 mg Oral Daily  . mouth rinse  15 mL Mouth Rinse BID  . melatonin  2.5 mg Oral QHS  . midodrine  10 mg Oral TID WC  . multivitamin  1 tablet Oral QHS  . pantoprazole  40 mg Oral BID  . polyethylene glycol  17 g Oral Daily  . saccharomyces boulardii  250 mg Oral BID  . spironolactone  25 mg Oral Daily   PRN Meds: acetaminophen, albuterol, alum & mag hydroxide-simeth **AND** lidocaine, bisacodyl, calcium carbonate, hydrocortisone cream, hydrOXYzine, loperamide, ondansetron (ZOFRAN) IV, polyvinyl alcohol, sodium chloride, traMADol, white petrolatum Continuous Infusions:   LOS: 100 days  Time spent: Greater than 50% of the 35 minute visit was spent in counseling/coordination of care for the patient as laid out in the A&P.   Dwyane Dee, MD [facility] 01/27/2020, 4:21 PM

## 2020-01-27 NOTE — Progress Notes (Signed)
0400:Pt called and c/o abd pain "all the way across" abd, rated at 5-6/10 and aching.  Abd soft, nontender to palpation. Pt states she is passing flatus w/o diff. Pt states "it feels like that sometimes". Tramadol, '50mg'$ , po and 1 chewable Tums admin per prn order. After 30 mins, pt reported relief of abd pain and requested decaf coffee. Coffee prepared and provided to pt. Will cont to monitor.

## 2020-01-27 NOTE — NC FL2 (Signed)
[place] LEVEL OF CARE SCREENING TOOL     IDENTIFICATION  Patient Name: Katelyn Lane Birthdate: 24-Jul-1955 Sex: female Admission Date (Current Location): 10/19/2019  [facility] and [place] Number:  Selena Lesser [id number] Facility and Address:  [facility], [address], [place], [place] [address]      Provider Number: [id number]  Attending Physician Name and Address:  Dwyane Dee, MD  Relative Name and Phone Number:       Current Level of Care: Hospital Recommended Level of Care: [facility] Prior Approval Number:    Date Approved/Denied:   PASRR Number: [id number]  Discharge Plan: Other (Comment) ([facility]/Group Home)    Current Diagnoses: Patient Active Problem List   Diagnosis Date Noted  . Ascites due to alcoholic cirrhosis ([place])   . Cardiorenal disease   . Goals of care, counseling/discussion   . Palliative care by specialist   . Acute diastolic (congestive) heart failure ([place]) 11/03/2019  . Aspiration pneumonia of both lower lobes due to gastric secretions ([place]) 11/02/2019  . Spontaneous bacterial peritonitis ([place]) 10/31/2019  . Dementia ([place]) 10/29/2019  . Cirrhosis of liver ([place]) 10/24/2019  . Cocaine abuse ([place]) 10/20/2019  . Peripheral edema   . Acute CHF (congestive heart failure) ([place]) 07/27/2019  . Prolonged QT interval 06/24/2019  . History of anemia due to chronic kidney disease 06/19/2019  . CHF, acute on chronic ([place]) 06/03/2019  . Acute on chronic combined systolic (congestive) and diastolic (congestive) heart failure ([place]) 06/03/2019  . Accelerated hypertension   . Nonsustained ventricular tachycardia ([place])   . Chronic obstructive pulmonary disease ([place])   . Pulmonary hypertension ([place])   . Acute metabolic encephalopathy [id number]  . History of substance abuse ([place]) 05/21/2019  . CHF (congestive heart failure) ([place]) 05/21/2019  . Acute systolic CHF (congestive heart failure) ([place]) 04/08/2019  . Acute  on chronic combined systolic and diastolic CHF (congestive heart failure) ([place]) 03/24/2019  . COVID-19 03/06/2019  . Acute respiratory failure with hypoxia ([place]) 03/06/2019  . Hypokalemia 03/06/2019  . LV (left ventricular) mural thrombus 03/06/2019  . Chronic kidney disease (CKD) stage G3b/A1, moderately decreased glomerular filtration rate (GFR) between 30-44 mL/min/1.73 square meter and albuminuria creatinine ratio less than 30 mg/g (HCC) 03/06/2019  . Nausea and vomiting 02/16/2019  . Abdominal pain 02/16/2019  . Rhabdomyolysis 02/16/2019  . AKI (acute kidney injury) ([place]) 02/16/2019  . Crack cocaine use 02/16/2019  . Tobacco abuse 02/16/2019  . Acute on chronic systolic CHF (congestive heart failure) ([place]) 02/16/2019  . Acute congestive heart failure ([place]) 02/13/2019  . Respiratory failure with hypoxia ([place]) 01/31/2019  . Stroke ([place]) 01/18/2019  . Dilated cardiomyopathy ([place]) 12/31/2018  . Chest tightness 12/31/2018  . Elevated troponin 12/31/2018  . Hypertensive urgency 12/31/2018  . Hyperlipidemia 12/31/2018  . Depression 12/31/2018  . HFrEF (heart failure with reduced ejection fraction) ([place]) 12/29/2018    Orientation RESPIRATION BLADDER Height & Weight     Self,Time,Situation,Place  O2 (O2 @ 2liters) Continent Weight: 84.5 kg Height:  '5\' 6"'$  (167.6 cm)  BEHAVIORAL SYMPTOMS/MOOD NEUROLOGICAL BOWEL NUTRITION STATUS      Continent Diet  AMBULATORY STATUS COMMUNICATION OF NEEDS Skin   Supervision Verbally Normal                       Personal Care Assistance Level of Assistance  Dressing,Bathing Bathing Assistance: Limited assistance Feeding assistance: Independent Dressing Assistance: Limited assistance Total Care Assistance: Limited assistance   Functional Limitations Info  SPECIAL CARE FACTORS FREQUENCY  PT (By licensed PT),OT (By licensed OT)     PT Frequency: 5 x weekly OT Frequency: 5 x weekly            Contractures       Additional Factors Info  Code Status (DNR) Code Status Info: DNR             Current Medications (01/27/2020):  This is the current hospital active medication list Current Facility-Administered Medications  Medication Dose Route Frequency Provider Last Rate Last Admin  . acetaminophen (TYLENOL) tablet 500 mg  500 mg Oral Q6H PRN Elodia Florence., MD   500 mg at 01/26/20 2218  . albuterol (PROVENTIL) (2.5 MG/3ML) 0.083% nebulizer solution 2.5 mg  2.5 mg Nebulization Q4H PRN Sharen Hones, MD   2.5 mg at 11/20/19 0801  . alum & mag hydroxide-simeth (MAALOX/MYLANTA) 200-200-20 MG/5ML suspension 30 mL  30 mL Oral Q4H PRN Schnier, Dolores Lory, MD   30 mL at 12/27/19 2314   And  . lidocaine (XYLOCAINE) 2 % viscous mouth solution 15 mL  15 mL Oral Q4H PRN Schnier, Dolores Lory, MD   15 mL at 10/23/19 2205  . apixaban (ELIQUIS) tablet 5 mg  5 mg Oral BID [name], MD   5 mg at 01/27/20 [id number]  . bisacodyl (DULCOLAX) suppository 10 mg  10 mg Rectal Daily PRN Schnier, Dolores Lory, MD   10 mg at 10/24/19 0459  . bumetanide (BUMEX) tablet 2 mg  2 mg Oral BID Liana Gerold, MD   2 mg at 01/27/20 0824  . calcium carbonate (TUMS - dosed in mg elemental calcium) chewable tablet 200 mg of elemental calcium  1 tablet Oral TID PRN Sharen Hones, MD   200 mg of elemental calcium at 01/27/20 0344  . ciprofloxacin (CIPRO) tablet 500 mg  500 mg Oral Q breakfast Elodia Florence., MD   500 mg at 01/27/20 0825  . doxycycline (VIBRA-TABS) tablet 100 mg  100 mg Oral Q12H Hall, Carole N, DO   100 mg at 01/27/20 [id number]  . epoetin alfa (EPOGEN) injection 20,000 Units  20,000 Units Subcutaneous Weekly Holley Raring, [place], MD   20,000 Units at 01/07/20 1353  . fluticasone (FLONASE) 50 MCG/ACT nasal spray 2 spray  2 spray Each Nare Daily Sharion Settler, NP   2 spray at 01/27/20 0827  . guaiFENesin (MUCINEX) 12 hr tablet 600 mg  600 mg Oral TID [name], MD   600 mg at 01/27/20 0825  .  hydrocortisone cream 0.5 %   Topical TID PRN Max Sane, MD   1 application at [phone]  . hydrOXYzine (ATARAX/VISTARIL) tablet 50 mg  50 mg Oral Q6H PRN Clapacs, Madie Reno, MD   50 mg at 01/17/20 1435  . iron polysaccharides (NIFEREX) capsule 150 mg  150 mg Oral Daily Sharen Hones, MD   150 mg at 01/26/20 1252  . ketotifen (ZADITOR) 0.025 % ophthalmic solution 1 drop  1 drop Both Eyes BID Max Sane, MD   1 drop at 01/27/20 0826  . loperamide (IMODIUM) capsule 2 mg  2 mg Oral PRN Sharen Hones, MD   2 mg at 11/09/19 0853  . loratadine (CLARITIN) tablet 10 mg  10 mg Oral Daily [name], MD   10 mg at 01/27/20 0825  . MEDLINE mouth rinse  15 mL Mouth Rinse BID Schnier, Dolores Lory, MD   15 mL at 01/27/20 0826  . melatonin tablet 2.5  mg  2.5 mg Oral QHS Sharen Hones, MD   2.5 mg at 01/26/20 2220  . midodrine (PROAMATINE) tablet 10 mg  10 mg Oral TID WC Swayze, Ava, DO   10 mg at 01/27/20 0825  . multivitamin (RENA-VIT) tablet 1 tablet  1 tablet Oral QHS Swayze, Ava, DO   1 tablet at 01/26/20 2219  . ondansetron (ZOFRAN) injection 4 mg  4 mg Intravenous Q6H PRN Schnier, Dolores Lory, MD      . pantoprazole (PROTONIX) EC tablet 40 mg  40 mg Oral BID Sharen Hones, MD   40 mg at 01/27/20 0825  . polyethylene glycol (MIRALAX / GLYCOLAX) packet 17 g  17 g Oral Daily Pahwani, Rinka R, MD   17 g at 01/27/20 0715  . polyvinyl alcohol (LIQUIFILM TEARS) 1.4 % ophthalmic solution 1 drop  1 drop Both Eyes PRN Sharion Settler, NP   1 drop at 01/12/20 2219  . saccharomyces boulardii (FLORASTOR) capsule 250 mg  250 mg Oral BID Irene Pap N, DO   250 mg at 01/27/20 0825  . sodium chloride (OCEAN) 0.65 % nasal spray 1 spray  1 spray Each Nare PRN Annita Brod, MD      . spironolactone (ALDACTONE) tablet 25 mg  25 mg Oral Daily Kolluru, Sarath, MD   25 mg at 01/27/20 0825  . traMADol (ULTRAM) tablet 50 mg  50 mg Oral Q12H PRN Pahwani, Rinka R, MD   50 mg at 01/27/20 0344  . white petrolatum (VASELINE)  gel   Topical PRN Pahwani, Michell Heinrich, MD   1 application at [id number]     Discharge Medications: Please see discharge summary for a list of discharge medications.  Relevant Imaging Results:  Relevant Lab Results:   Additional Information SS# [id number]  Anselm Pancoast, RN

## 2020-01-28 DIAGNOSIS — E871 Hypo-osmolality and hyponatremia: Secondary | ICD-10-CM

## 2020-01-28 DIAGNOSIS — I9589 Other hypotension: Secondary | ICD-10-CM

## 2020-01-28 DIAGNOSIS — I491 Atrial premature depolarization: Secondary | ICD-10-CM

## 2020-01-28 DIAGNOSIS — D649 Anemia, unspecified: Secondary | ICD-10-CM

## 2020-01-28 DIAGNOSIS — K652 Spontaneous bacterial peritonitis: Secondary | ICD-10-CM | POA: Diagnosis not present

## 2020-01-28 DIAGNOSIS — K219 Gastro-esophageal reflux disease without esophagitis: Secondary | ICD-10-CM

## 2020-01-28 DIAGNOSIS — I493 Ventricular premature depolarization: Secondary | ICD-10-CM

## 2020-01-28 DIAGNOSIS — K5900 Constipation, unspecified: Secondary | ICD-10-CM

## 2020-01-28 DIAGNOSIS — I509 Heart failure, unspecified: Secondary | ICD-10-CM

## 2020-01-28 MED ORDER — CIPROFLOXACIN HCL 500 MG PO TABS
250.0000 mg | ORAL_TABLET | Freq: Every day | ORAL | Status: DC
  Administered 2020-01-29: 250 mg via ORAL
  Filled 2020-01-28: qty 1

## 2020-01-28 NOTE — Assessment & Plan Note (Signed)
-   continue monitoring renal function - continue CHF meds - will need outpatient management with cardiology and nephrology, but compliance and other factors remain a concern

## 2020-01-28 NOTE — Plan of Care (Addendum)
Pt Axox4. Calm and cooperative and able to voice her needs. Some abdominal discomfort noted post-paracentesis. Prn pain meds adm and effective. Pt states has had a large BM as well. Pt remains independent, takes pills whole and ambulated in her room overnight with no oxygen needed. On RA. Pt awaiting discharge. Safety measures in place. Will continue to monitor.  Problem: Education: Goal: Knowledge of General Education information will improve Description: Including pain rating scale, medication(s)/side effects and non-pharmacologic comfort measures Outcome: Progressing   Problem: Health Behavior/Discharge Planning: Goal: Ability to manage health-related needs will improve Outcome: Progressing   Problem: Clinical Measurements: Goal: Ability to maintain clinical measurements within normal limits will improve Outcome: Progressing Goal: Will remain free from infection Outcome: Progressing Goal: Diagnostic test results will improve Outcome: Progressing Goal: Respiratory complications will improve Outcome: Progressing Goal: Cardiovascular complication will be avoided Outcome: Progressing   Problem: Nutrition: Goal: Adequate nutrition will be maintained Outcome: Progressing   Problem: Coping: Goal: Level of anxiety will decrease Outcome: Progressing   Problem: Elimination: Goal: Will not experience complications related to bowel motility Outcome: Progressing Goal: Will not experience complications related to urinary retention Outcome: Progressing   Problem: Pain Managment: Goal: General experience of comfort will improve Outcome: Progressing   Problem: Skin Integrity: Goal: Risk for impaired skin integrity will decrease Outcome: Progressing   Problem: Education: Goal: Ability to demonstrate management of disease process will improve Outcome: Progressing Goal: Ability to verbalize understanding of medication therapies will improve Outcome: Progressing Goal: Individualized  Educational Video(s) Outcome: Progressing   Problem: Activity: Goal: Capacity to carry out activities will improve Outcome: Progressing   Problem: Education: Goal: Knowledge of disease and its progression will improve Outcome: Progressing   Problem: Fluid Volume: Goal: Compliance with measures to maintain balanced fluid volume will improve Outcome: Progressing   Problem: Health Behavior/Discharge Planning: Goal: Ability to manage health-related needs will improve Outcome: Progressing   Problem: Nutritional: Goal: Ability to make healthy dietary choices will improve Outcome: Progressing   Problem: Clinical Measurements: Goal: Complications related to the disease process, condition or treatment will be avoided or minimized Outcome: Progressing

## 2020-01-28 NOTE — Assessment & Plan Note (Addendum)
-   expected BP 90/60s in setting of cirrhosis - currently on midodrine, ok to continue for now

## 2020-01-28 NOTE — Assessment & Plan Note (Addendum)
-   patient is feasibly homeless; now in hospital 100+ days; she has turned down offered venues for discharge (group home and transitional home, and others).  - per SW with DSS patient has capacity - see hospital course above regarding capacity determined prior to discharge

## 2020-01-28 NOTE — Assessment & Plan Note (Signed)
-   etiology HRS vs CRS vs combo - Baseline creat appears to be possibly 2.2 -Renal function again starting to variate - s/p short term HD 10/17 and 10/18; patient stating she would not want HD again; also is a DNR

## 2020-01-28 NOTE — Assessment & Plan Note (Signed)
-   patient has been informed that any further etoh or substance use will be extremely detrimental to her health

## 2020-01-28 NOTE — Assessment & Plan Note (Signed)
continue Eliquis Currently bradycardic.  Discussed with family at this point would like to concentrate on comfort discontinue telemetry   

## 2020-01-28 NOTE — Assessment & Plan Note (Signed)
continue PPI

## 2020-01-28 NOTE — Assessment & Plan Note (Addendum)
-   she is starting to require a paracentesis approx every 3 weeks - last paracentesis 1/19, removed 5.3 L (negative for SBP) -Cytology from 01/27/2020 specimen is negative for malignancy - continue bumex and spironolactone - would benefit from ongoing care with GI. May need EGD in future

## 2020-01-28 NOTE — Assessment & Plan Note (Signed)
-   s/p weaned off O2, also passed ambulatory test with desatting

## 2020-01-28 NOTE — Assessment & Plan Note (Addendum)
-   cardiac, renal, hepatic  - prognosis very guarded to poor long term especially if any deviation of her medications and/or further cocaine/alcohol use - patient already declining further HD? Not good for her prognosis either long term if needed - will need to keep [place] discussions ongoing outpatient - currently DNR

## 2020-01-28 NOTE — Assessment & Plan Note (Signed)
-   no thrombus seen on last echo on 06/22/19; she remains on Eliquis for hx of CVA

## 2020-01-28 NOTE — Assessment & Plan Note (Signed)
-   TTE from 06/22/19 shows EF 25 to 30% with grade 3 DD - Ptstated shedoes not want to get dialysis again. -Continue Eliquis, Bumex, spironolactone

## 2020-01-28 NOTE — TOC Progression Note (Signed)
Transition of Care [facility]) - Progression Note    Patient Details  Name: Katelyn Lane MRN: [id number] Date of Birth: 04-Mar-1955  Transition of Care [facility]) CM/SW [place], RN Phone Number: 01/28/2020, 3:24 PM  Clinical Narrative:    [facility] supervisor outreached to 88 known group homes in [place] and no availability for patient other than what has been offered. Patient continues to refuse discharge to any of [place] offered. Patient is aware of no other availability and other option being discharged to the street. Understands that local homeless shelter is full and not an option. Confirmed [place], DSS SW has evaluated patient and determined patient has capacity. Did suggest [facility] as outpatient through [place]. Discussed with attending and will follow up tomorrow after opportunity for psych to assess.         Expected Discharge Plan and Services           Expected Discharge Date: 01/08/20                                     Social Determinants of Health (SDOH) Interventions    Readmission Risk Interventions Readmission Risk Prevention Plan 09/02/2019 08/14/2019 07/27/2019  Transportation Screening Complete Complete Complete  Medication Review Press photographer) Complete Referral to Pharmacy -  PCP or Specialist appointment within 3-5 days of discharge Complete Complete Complete  HRI or Home Care Consult - Complete Complete  SW Recovery Care/Counseling Consult - Complete Complete  Palliative Care Screening Not Applicable Not Applicable Not Applicable  Skilled Nursing Facility Complete Not Applicable Not Applicable  Some recent data might be hidden

## 2020-01-28 NOTE — Assessment & Plan Note (Signed)
-   continue bowel regimen

## 2020-01-28 NOTE — Assessment & Plan Note (Addendum)
-   s/p treatment on admission  - now on cipro for ppx indefinitely per GI -Cipro dose at discharge 250 mg daily for renal function - EKG was obtained on 01/28/2020.  QTc 486, sinus rhythm with PVCs and PACs.  LAD appreciated - She will need intermittent EKG monitoring at follow-up appointments while on chronic Cipro

## 2020-01-28 NOTE — Assessment & Plan Note (Signed)
-   s/p abx course 

## 2020-01-28 NOTE — TOC Progression Note (Addendum)
Transition of Care [facility]) - Progression Note    Patient Details  Name: Ernesto Thwaites MRN: [id number] Date of Birth: [birth year]  Transition of Care [facility]) CM/SW [place], RN Phone Number: 01/28/2020, 9:31 AM  Clinical Narrative:    Spoke to [facility] @ [phone] [facility] states she has bed availability in the city of [place]. Faxed Fl2 and paperwork to [phone]. Will discuss with patient as previously denied other offers.   Call to [employer] [facility] shelter in [place]: s/w Ulice Dash who confirmed no availability at homeless shelter.         Expected Discharge Plan and Services           Expected Discharge Date: 01/08/20                                     Social Determinants of Health (SDOH) Interventions    Readmission Risk Interventions Readmission Risk Prevention Plan 09/02/2019 08/14/2019 07/27/2019  Transportation Screening Complete Complete Complete  Medication Review Press photographer) Complete Referral to Pharmacy -  PCP or Specialist appointment within 3-5 days of discharge Complete Complete Complete  HRI or Home Care Consult - Complete Complete  SW Recovery Care/Counseling Consult - Complete Complete  Palliative Care Screening Not Applicable Not Applicable Not Applicable  Skilled Nursing Facility Complete Not Applicable Not Applicable  Some recent data might be hidden

## 2020-01-28 NOTE — Assessment & Plan Note (Addendum)
-   Hemoglobin stable - continue iron

## 2020-01-28 NOTE — Progress Notes (Addendum)
PROGRESS NOTE    Katelyn Lane   [id number]  DOB: 1955-07-17  DOA: 10/19/2019     101  PCP: [name], MD  CC: SOB  Hospital Course: Katelyn Lane is a 65 y.o. 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    After admission to the hospital, she was diagnosed with exacerbation of acute on chronic combined systolic and diastolic congestive heart failure with ejection fraction 25 to 30% (last echo 06/22/19).   She was given a trial of Lasix which she did not respond to.  She was then evaluated by nephrology and underwent temporary dialysis catheter placement and was dialyzed on 10/17 and 10/18.  Kidney function then improved and she has been having adequate urine output although renal function remains declined. She also had abdominal pain and distention.  Imaging was consistent with alcoholic cirrhosis with ascites and she has undergone serial paracenteses throughout hospitalization.  Initial paracentesis showed SBP and she required treatment with Rocephin and Flagyl.  She was evaluated by GI with recommendations for lifelong SBP prophylaxis and is currently on ciprofloxacin.  Also during hospitalization she had an aspiration event on 10/25/2019 and was treated with antibiotic course as well. Following this on 10/27/2019 she developed worsening abdominal pain and was evaluated by general surgery and she was not considered to warrant any intervention and underlying ischemia was ruled out.  She has been evaluated by psychiatry during hospitalization due to concern for capacity.  Last evaluation by psychiatry was on 01/18/2020.  At that time, she was noted to lack " capacity to take care of herself independently."  However in regards to overall decision-making capacity especially for disposition purposes, this is yet  to be determined.  Interval History:  Sitting up in bed this morning stating she feels improved compared to yesterday in terms of her abdominal distention and soreness.  She underwent paracentesis yesterday removing 5.3 L fluid. She denies any shortness of breath or chest pain.  Still endorses ongoing swelling in her legs which I again stated would be mostly chronic for her.  We also reviewed her diuretics that she is on. Case management/social work continues to assist in [place].  Another offer has been received from [facility] group home in [place].  We will follow-up with further discussions with patient if she accepts this.  Old records reviewed in assessment of this patient  ROS: Constitutional: negative for chills and fevers, Respiratory: negative for cough, Cardiovascular: negative for chest pain and Gastrointestinal: negative for abdominal pain  Assessment & Plan: * Spontaneous bacterial peritonitis (HCC)-resolved as of 01/28/2020 - s/p treatment on admission  - now on cipro for ppx indefinitely per GI - Difficult with drug choice given renal function and CHF however at this time Cipro will be continued.  She will need intermittent EKG monitoring to evaluate QTc  Alcoholic cirrhosis of liver with ascites ([place]) - she is starting to require a paracentesis approx every 3 weeks - last paracentesis 1/19, removed 5.3 L (negative for SBP) - follow up cytology from 1/19 paracentesis as do not see prior cytology done  - continue bumex and spironolactone - would benefit from ongoing care with GI. May need EGD in future  Acute on chronic combined systolic (congestive) and diastolic (congestive) heart failure (HCC) - TTE from 06/22/19 shows EF 25 to 30% with grade 3  DD - Ptstated shedoes not want to get dialysis again. -Continue Eliquis, Bumex, spironolactone  Multiple organ failure with heart failure (HCC) - cardiac, renal, hepatic  - prognosis very guarded to  poor long term especially if any deviation of her medications and/or further cocaine/alcohol use - patient already declining further HD? Not good for her prognosis either long term if needed - will need to keep [place] discussions ongoing outpatient - currently DNR  Goals of care, counseling/discussion - patient is feasibly homeless; now in hospital 100+ days; she has turned down offered venues for discharge (group home and transitional home, and others). Can give some reasons why she won't go but appears to lack insight as to the validity of such reason (such as "it's too far away" or "I don't know that place"). When I confront her on what that means if she refuses places yet is discharged, she does agree that she would be homeless on the street and states she does not want that option either. She has a notebook she showed me of places she's been supposedly calling and waiting to find out more about availability; in the meantime SW/CM has been assisting options as well - for now, continuing to await further input from both patient and SW/CM - per psych, she lacks capacity for "taking care of herself independently"; as for decision making capacity that is still to be determined as we are awaiting further options to truly present to the patient  - I believe we may need ethics committee involvement if we approach a situation where she has capacity to make the decision but refuses all options (e.g. discharge to homelessness)  Acute renal failure superimposed on stage 3a chronic kidney disease ([place]) - etiology HRS vs CRS vs combo - Baseline creat appears to be possibly 2.2 -Renal function again starting to variate - s/p short term HD 10/17 and 10/18; patient stating she would not want HD again; also is a DNR  Acute respiratory failure with hypoxia (HCC)-resolved as of 01/28/2020 - s/p weaned off O2, also passed ambulatory test with desatting  Aspiration pneumonia of both lower lobes due to gastric  secretions (HCC)-resolved as of 01/28/2020 - s/p abx course   Hyponatremia - Some degree of hyponatremia expected in setting of CHF and cirrhosis  Constipation - continue bowel regimen   GERD (gastroesophageal reflux disease) - continue PPI   Hypotension, chronic - expected BP 90/60s in setting of cirrhosis - currently on midodrine, ok to continue for now - continue vitals monitoring while in hospital   Normocytic anemia - Hemoglobin stable - continue iron  Cardiorenal disease - continue monitoring renal function - continue CHF meds - will need outpatient management with cardiology and nephrology, but compliance and other factors remain a concern   Cocaine abuse [facility]) - patient has been informed that any further etoh or substance use will be extremely detrimental to her health   History of CVA (cerebrovascular accident) - continue Eliquis  LV (left ventricular) mural thrombus-resolved as of 01/28/2020 - no thrombus seen on last echo on 06/22/19; she remains on Eliquis for hx of CVA   DVT prophylaxis: Eliquis Code Status: DNR Family Communication: None present Disposition Plan: Status is: Inpatient  Remains inpatient appropriate because:Unsafe d/c plan and might still need further capacity eval by psych pending dispo offers   Dispo:  Patient From: Other  Planned Disposition: Group Home or similar   Expected discharge date: when place found  Medically stable for discharge: Yes   Objective:  Blood pressure 116/90, pulse 81, temperature 97.7 F (36.5 C), temperature source Oral, resp. rate 18, height '5\' 6"'$  (1.676 m), weight 84.5 kg, SpO2 100 %.  Examination: General appearance: alert, cooperative and no distress Head: Normocephalic, without obvious abnormality, atraumatic Eyes: EOMI Lungs: clear to auscultation bilaterally Heart: irregularly irregular rhythm, S1, S2 normal and 3/6 HSM heard throughout precordium Abdomen: slightly distended and obese; BS present;  NT Extremities: chronic 1-2+ LE edema Skin: mobility and turgor normal Neurologic: Grossly normal  Consultants:     Procedures:     Data Reviewed: I have personally reviewed following labs and imaging studies Results for orders placed or performed during the hospital encounter of 10/19/19 (from the past 24 hour(s))  Body fluid cell count with differential     Status: Abnormal   Collection Time: 01/27/20  4:05 PM  Result Value Ref Range   Fluid Type-FCT CYTO PERI    Color, Fluid YELLOW (A) YELLOW   Appearance, Fluid CLEAR CLEAR   Total Nucleated Cell Count, Fluid 138 cu mm   Neutrophil Count, Fluid 2 %   Lymphs, Fluid 5 %   Monocyte-Macrophage-Serous Fluid 93 %   Eos, Fluid 0 %   Other Cells, Fluid 0 %  Body fluid culture     Status: None (Preliminary result)   Collection Time: 01/27/20  4:05 PM   Specimen: PATH Cytology Peritoneal fluid  Result Value Ref Range   Specimen Description      PERITONEAL Performed at [facility], [address] [place], [place] [address]    Special Requests      NONE Performed at [facility], [address], [place], [place] [address]    Gram Stain      NO ORGANISMS SEEN Performed at [facility], Home [address] [place], [place] [address]    Culture PENDING    Report Status PENDING     Recent Results (from the past 240 hour(s))  Body fluid culture     Status: None (Preliminary result)   Collection Time: 01/27/20  4:05 PM   Specimen: PATH Cytology Peritoneal fluid  Result Value Ref Range Status   Specimen Description   Final    PERITONEAL Performed at [facility], [address], [place], [place] [address]    Special Requests   Final    NONE Performed at [facility], [address], [place], [place] [address]    Gram Stain   Final    NO ORGANISMS SEEN Performed at [facility], [place] [address], [place], [place] [address]    Culture PENDING  Incomplete   Report Status  PENDING  Incomplete     Radiology Studies: US Paracentesis  Result Date: 01/27/2020 INDICATION: Cirrhosis with ascites EXAM: ULTRASOUND GUIDED  PARACENTESIS MEDICATIONS: None. COMPLICATIONS: None immediate. PROCEDURE: Informed written consent was obtained from the patient after a discussion of the risks, benefits and alternatives to treatment. A timeout was performed prior to the initiation of the procedure. Initial ultrasound scanning demonstrates a moderate amount of ascites within the right lower abdominal quadrant. The right lower abdomen was prepped and draped in the usual sterile fashion. 1% lidocaine was used for local anesthesia. Following this, a 6 Fr Safe-T-Centesis catheter was introduced. An ultrasound image was saved for documentation purposes. The paracentesis was performed. The catheter was removed and a dressing was applied. The patient tolerated the procedure well without immediate post procedural complication. FINDINGS: A total of approximately 5.3 L of straw-colored  fluid was removed. Samples were sent to the laboratory as requested by the clinical team. IMPRESSION: Successful ultrasound-guided paracentesis yielding 5.3 liters of peritoneal fluid. Electronically Signed   By: Miachel Roux M.D.   On: 01/27/2020 16:57   US Paracentesis  Final Result    CT MAXILLOFACIAL WO CONTRAST  Final Result    US Paracentesis  Final Result    DG Chest Port 1 View  Final Result    US Paracentesis  Final Result    [name] ASCITES (ABDOMEN LIMITED)  Final Result    US Paracentesis  Final Result    [name] ASCITES (ABDOMEN LIMITED)  Final Result    DG Chest Port 1 View  Final Result    DG Chest Port 1 View  Final Result    US Paracentesis  Final Result    US Paracentesis  Final Result    CT ABDOMEN PELVIS WO CONTRAST  Final Result    DG Abd 1 View  Final Result    US Paracentesis  Final Result    DG ABD ACUTE 2+V W 1V CHEST  Final Result    DG Chest Port 1 View  Final  Result    US Abdomen Complete  Final Result    US RENAL  Final Result    DG Chest 2 View  Final Result      Scheduled Meds: . apixaban  5 mg Oral BID  . bumetanide  2 mg Oral BID  . ciprofloxacin  500 mg Oral Q breakfast  . doxycycline  100 mg Oral Q12H  . epoetin (EPOGEN/PROCRIT) injection  20,000 Units Subcutaneous Weekly  . fluticasone  2 spray Each Nare Daily  . guaiFENesin  600 mg Oral TID  . iron polysaccharides  150 mg Oral Daily  . ketotifen  1 drop Both Eyes BID  . loratadine  10 mg Oral Daily  . mouth rinse  15 mL Mouth Rinse BID  . melatonin  2.5 mg Oral QHS  . midodrine  10 mg Oral TID WC  . multivitamin  1 tablet Oral QHS  . pantoprazole  40 mg Oral BID  . polyethylene glycol  17 g Oral Daily  . saccharomyces boulardii  250 mg Oral BID  . spironolactone  25 mg Oral Daily   PRN Meds: acetaminophen, albuterol, alum & mag hydroxide-simeth **AND** lidocaine, bisacodyl, calcium carbonate, hydrocortisone cream, hydrOXYzine, loperamide, ondansetron (ZOFRAN) IV, polyvinyl alcohol, sodium chloride, traMADol, white petrolatum Continuous Infusions:   LOS: 101 days  Time spent: Greater than 50% of the 35 minute visit was spent in counseling/coordination of care for the patient as laid out in the A&P.   Dwyane Dee, MD [facility] 01/28/2020, 10:43 AM

## 2020-01-28 NOTE — TOC Progression Note (Signed)
Transition of Care [facility]) - Progression Note    Patient Details  Name: Krissy Misquez MRN: [id number] Date of Birth: 10/19/1955  Transition of Care [facility]) CM/SW [place], RN Phone Number: 01/28/2020, 12:18 PM  Clinical Narrative:   RNCM reached out to patient to discuss discharge planning. Patient informed of group home availability at [facility] in [place] to which patient replied she will not be going to [facility]. She stated she is not agreeable to leave the county, she wants to be in [place] but may consider [employer] or [place].          Expected Discharge Plan and Services           Expected Discharge Date: 01/08/20                                     Social Determinants of Health (SDOH) Interventions    Readmission Risk Interventions Readmission Risk Prevention Plan 09/02/2019 08/14/2019 07/27/2019  Transportation Screening Complete Complete Complete  Medication Review Press photographer) Complete Referral to Pharmacy -  PCP or Specialist appointment within 3-5 days of discharge Complete Complete Complete  HRI or Home Care Consult - Complete Complete  SW Recovery Care/Counseling Consult - Complete Complete  Palliative Care Screening Not Applicable Not Applicable Not Applicable  Skilled Nursing Facility Complete Not Applicable Not Applicable  Some recent data might be hidden

## 2020-01-28 NOTE — Assessment & Plan Note (Signed)
-   Some degree of hyponatremia expected in setting of CHF and cirrhosis

## 2020-01-28 NOTE — Hospital Course (Addendum)
Katelyn Lane is a 65 y.o. 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    After admission to the hospital, she was diagnosed with exacerbation of acute on chronic combined systolic and diastolic congestive heart failure with ejection fraction 25 to 30% (last echo 06/22/19).   She was given a trial of Lasix which she did not respond to.  She was then evaluated by nephrology and underwent temporary dialysis catheter placement and was dialyzed on 10/17 and 10/18.  Kidney function then improved and she has been having adequate urine output although renal function remains declined. She also had abdominal pain and distention.  Imaging was consistent with alcoholic cirrhosis with ascites and she has undergone serial paracenteses throughout hospitalization.  Initial paracentesis showed SBP and she required treatment with Rocephin and Flagyl.  She was evaluated by GI with recommendations for lifelong SBP prophylaxis and is currently on ciprofloxacin.  Also during hospitalization she had an aspiration event on 10/25/2019 and was treated with antibiotic course as well. Following this on 10/27/2019 she developed worsening abdominal pain and was evaluated by general surgery and she was not considered to warrant any intervention and underlying ischemia was ruled out.  She was most recently assessed by DSS SW and considered to have capacity. I also assessed patient prior to discharge and she does express ability to understand decisions and consequences, and therefore the capacity to make her decisions (regardless of her choices, e.g. hx drug abuse).  She was able to show understanding of her choices presented which were multiple group homes offered by the hospital.  She expressed her choice time and time again which was to decline every offered group home.   She showed "appreciation" of the decision with the reasoning that each offered group home was either "too far away" or that she simply "did not want to go because they might not be nice".  Lastly, she showed reasoning with the decision stating that she knew the alternative consequence was not only finding shelter on her own (has a notebook of apartments and hotels to go stay in) but that she would be homeless otherwise.  In order to avoid this, she had called her friend that she told me was named Katelyn Lane who also lives in [place] and that she would go stay with her upon discharge from the hospital.  Furthermore, she called her nephew who was her pick up ride from the hospital at time of discharge as well. She was strongly encouraged to avoid any further illicit drug use or alcohol use given her multiorgan disease. She was stable at time of discharge and prescriptions were sent to her pharmacy.  Outpatient follow-up appointment with her PCP was also arranged prior to discharge.

## 2020-01-28 NOTE — Consult Note (Signed)
[facility] [place] [facility] Consult   Reason for Consult: Requested follow-up consult for 65 year old woman who has been in the hospital for quite a long time. Referring Physician:  Girguis Patient Identification: Katelyn Lane MRN:  [id number] Principal Diagnosis: Spontaneous bacterial peritonitis ([place]) Diagnosis:  Active Problems:   History of CVA (cerebrovascular accident)   Acute renal failure superimposed on stage 3a chronic kidney disease ([place])   Acute on chronic combined systolic (congestive) and diastolic (congestive) heart failure (HCC)   Cocaine abuse ([place])   Cardiorenal disease   Goals of care, counseling/discussion   Palliative care by specialist   Alcoholic cirrhosis of liver with ascites ([place])   Normocytic anemia   Multiple organ failure with heart failure (HCC)   Hypotension, chronic   GERD (gastroesophageal reflux disease)   Constipation   Hyponatremia   Total Time spent with patient: 30 minutes  Subjective:   Katelyn Lane is a 65 y.o. female patient admitted with "things are about the same".  HPI: Patient seen for follow-up.  Spoke with the patient.  Spoke with hospitalist.  Reviewed the chart including my last note.  The patient herself has no change in her complaints or in her mental state.  Hospitalist today called to asked me to reevaluate the patient because the treatment team plans to discharge her tomorrow.  I told the patient that this is what I had been told that she was to be discharged tomorrow.  She expressed quite a bit of shock and said no one had told her.  I ask her whether they had made offers to her of places to stay and she acknowledged that they had presented options of group homes to her but that she had turned them down because they were too far away.  After getting over her brief surprise the patient said that she would be able to make it when she left tomorrow.  She said she thought she could probably call somebody to pick her up and have a place  to stay for at least a few days.  Not expressing any suicidal thoughts.  No evidence of psychosis.  Past Psychiatric History: See previous.  Long history of substance abuse  Risk to Self:   Risk to Others:   Prior Inpatient Therapy:   Prior Outpatient Therapy:    Past Medical History:  Past Medical History:  Diagnosis Date  . CHF (congestive heart failure) ([place])   . COVID-19   . Hyperlipidemia   . Hypertension   . Renal disorder     Past Surgical History:  Procedure Laterality Date  . DIALYSIS/PERMA CATHETER INSERTION N/A 10/28/2019   Procedure: DIALYSIS/PERMA CATHETER INSERTION;  Surgeon: Katha Cabal, MD;  Location: [facility];  Service: Cardiovascular;  Laterality: N/A;  . DIALYSIS/PERMA CATHETER REMOVAL N/A 11/13/2019   Procedure: DIALYSIS/PERMA CATHETER REMOVAL;  Surgeon: Algernon Huxley, MD;  Location: [facility];  Service: Cardiovascular;  Laterality: N/A;  . RIGHT/LEFT HEART CATH AND CORONARY ANGIOGRAPHY N/A 12/30/2018   Procedure: RIGHT/LEFT HEART CATH AND CORONARY ANGIOGRAPHY;  Surgeon: Corey Skains, MD;  Location: [facility];  Service: Cardiovascular;  Laterality: N/A;   Family History:  Family History  Problem Relation Age of Onset  . Breast cancer Neg Hx    Family Psychiatric  History: See previous Social History:  Social History   Substance and Sexual Activity  Alcohol Use No  . Alcohol/week: 0.0 standard drinks     Social History   Substance and Sexual  Activity  Drug Use Yes  . Types: Cocaine   Comment: 3-4 days ago    Social History   Socioeconomic History  . Marital status: Single    Spouse name: Not on file  . Number of children: Not on file  . Years of education: Not on file  . Highest education level: Not on file  Occupational History  . Not on file  Tobacco Use  . Smoking status: Current Every Day Smoker  . Smokeless tobacco: Never Used  Substance and Sexual Activity  . Alcohol use: No     Alcohol/week: 0.0 standard drinks  . Drug use: Yes    Types: Cocaine    Comment: 3-4 days ago  . Sexual activity: Not on file  Other Topics Concern  . Not on file  Social History Narrative  . Not on file   Social Determinants of Health   Financial Resource Strain: Not on file  Food Insecurity: Not on file  Transportation Needs: Not on file  Physical Activity: Not on file  Stress: Not on file  Social Connections: Not on file   Additional Social History:    Allergies:   Allergies  Allergen Reactions  . Penicillins Hives, Rash and Other (See Comments)    Did it involve swelling of the face/tongue/throat, SOB, or low BP? No Did it involve sudden or severe rash/hives, skin peeling, or any reaction on the inside of your mouth or nose? Yes Did you need to seek medical attention at a hospital or doctor's office? Yes When did it last happen? >25 years If all above answers are "NO", may proceed with cephalosporin use.   . Ace Inhibitors Nausea And Vomiting    Labs:  Results for orders placed or performed during the hospital encounter of 10/19/19 (from the past 48 hour(s))  Body fluid cell count with differential     Status: Abnormal   Collection Time: 01/27/20  4:05 PM  Result Value Ref Range   Fluid Type-FCT CYTO PERI    Color, Fluid YELLOW (A) YELLOW   Appearance, Fluid CLEAR CLEAR   Total Nucleated Cell Count, Fluid 138 cu mm   Neutrophil Count, Fluid 2 %   Lymphs, Fluid 5 %   Monocyte-Macrophage-Serous Fluid 93 %   Eos, Fluid 0 %   Other Cells, Fluid 0 %    Comment: Performed at [facility], [address], [place], [place] [address]  Body fluid culture     Status: None (Preliminary result)   Collection Time: 01/27/20  4:05 PM   Specimen: PATH Cytology Peritoneal fluid  Result Value Ref Range   Specimen Description      PERITONEAL Performed at [facility] [address] [place], [place] [address]    Special Requests       NONE Performed at [facility], [address] [place], [place] [address]    Gram Stain      NO ORGANISMS SEEN Performed at [facility], [place] [address] [place], [place] [address]    Culture PENDING    Report Status PENDING     Current Facility-Administered Medications  Medication Dose Route Frequency Provider Last Rate Last Admin  . acetaminophen (TYLENOL) tablet 500 mg  500 mg Oral Q6H PRN Elodia Florence., MD   500 mg at 01/28/20 1229  . albuterol (PROVENTIL) (2.5 MG/3ML) 0.083% nebulizer solution 2.5 mg  2.5 mg Nebulization Q4H PRN Sharen Hones, MD   2.5 mg at 11/20/19 0801  .  alum & mag hydroxide-simeth (MAALOX/MYLANTA) 200-200-20 MG/5ML suspension 30 mL  30 mL Oral Q4H PRN Schnier, Dolores Lory, MD   30 mL at 12/27/19 2314   And  . lidocaine (XYLOCAINE) 2 % viscous mouth solution 15 mL  15 mL Oral Q4H PRN Schnier, Dolores Lory, MD   15 mL at 10/23/19 2205  . apixaban (ELIQUIS) tablet 5 mg  5 mg Oral BID [name], MD   5 mg at 01/28/20 [id number]  . bisacodyl (DULCOLAX) suppository 10 mg  10 mg Rectal Daily PRN Schnier, Dolores Lory, MD   10 mg at 10/24/19 0459  . bumetanide (BUMEX) tablet 2 mg  2 mg Oral BID Liana Gerold, MD   2 mg at 01/28/20 1705  . calcium carbonate (TUMS - dosed in mg elemental calcium) chewable tablet 200 mg of elemental calcium  1 tablet Oral TID PRN Sharen Hones, MD   200 mg of elemental calcium at 01/27/20 0344  . [START ON 01/29/2020] ciprofloxacin (CIPRO) tablet 250 mg  250 mg Oral Q breakfast Berton Mount, RPH      . doxycycline (VIBRA-TABS) tablet 100 mg  100 mg Oral Q12H Hall, Carole N, DO   100 mg at 01/28/20 [id number]  . epoetin alfa (EPOGEN) injection 20,000 Units  20,000 Units Subcutaneous Weekly Holley Raring, [place], MD   20,000 Units at 01/07/20 1353  . fluticasone (FLONASE) 50 MCG/ACT nasal spray 2 spray  2 spray Each Nare Daily Sharion Settler, NP   2 spray at 01/28/20 0844  . guaiFENesin (MUCINEX) 12 hr tablet 600 mg  600 mg  Oral TID [name], MD   600 mg at 01/28/20 1558  . hydrocortisone cream 0.5 %   Topical TID PRN Max Sane, MD   1 application at 0000000 2118  . hydrOXYzine (ATARAX/VISTARIL) tablet 50 mg  50 mg Oral Q6H PRN Clapacs, Madie Reno, MD   50 mg at 01/17/20 1435  . iron polysaccharides (NIFEREX) capsule 150 mg  150 mg Oral Daily Sharen Hones, MD   150 mg at 01/28/20 1229  . ketotifen (ZADITOR) 0.025 % ophthalmic solution 1 drop  1 drop Both Eyes BID Max Sane, MD   1 drop at 01/28/20 0844  . loperamide (IMODIUM) capsule 2 mg  2 mg Oral PRN Sharen Hones, MD   2 mg at 11/09/19 0853  . loratadine (CLARITIN) tablet 10 mg  10 mg Oral Daily [name], MD   10 mg at 01/28/20 [id number]  . MEDLINE mouth rinse  15 mL Mouth Rinse BID Schnier, Dolores Lory, MD   15 mL at 01/28/20 0844  . melatonin tablet 2.5 mg  2.5 mg Oral QHS Sharen Hones, MD   2.5 mg at 01/27/20 2047  . midodrine (PROAMATINE) tablet 10 mg  10 mg Oral TID WC Swayze, Ava, DO   10 mg at 01/28/20 1558  . multivitamin (RENA-VIT) tablet 1 tablet  1 tablet Oral QHS Swayze, Ava, DO   1 tablet at 01/27/20 2047  . ondansetron (ZOFRAN) injection 4 mg  4 mg Intravenous Q6H PRN Schnier, Dolores Lory, MD      . pantoprazole (PROTONIX) EC tablet 40 mg  40 mg Oral BID Sharen Hones, MD   40 mg at 01/28/20 0843  . polyethylene glycol (MIRALAX / GLYCOLAX) packet 17 g  17 g Oral Daily Pahwani, Rinka R, MD   17 g at 01/28/20 0843  . polyvinyl alcohol (LIQUIFILM TEARS) 1.4 % ophthalmic solution 1 drop  1 drop Both  Eyes PRN Sharion Settler, NP   1 drop at 01/12/20 2219  . saccharomyces boulardii (FLORASTOR) capsule 250 mg  250 mg Oral BID Irene Pap N, DO   250 mg at 01/28/20 [id number]  . sodium chloride (OCEAN) 0.65 % nasal spray 1 spray  1 spray Each Nare PRN Annita Brod, MD      . spironolactone (ALDACTONE) tablet 25 mg  25 mg Oral Daily Kolluru, Sarath, MD   25 mg at 01/28/20 0843  . traMADol (ULTRAM) tablet 50 mg  50 mg Oral Q12H PRN Pahwani, Rinka  R, MD   50 mg at 01/27/20 2048  . white petrolatum (VASELINE) gel   Topical PRN Pahwani, Michell Heinrich, MD   1 application at [id number]    Musculoskeletal: Strength & Muscle Tone: within normal limits Gait & Station: normal Patient leans: N/A  Psychiatric Specialty Exam: Physical Exam Vitals and nursing note reviewed.  Constitutional:      Appearance: She is well-developed and well-nourished.  HENT:     Head: Normocephalic and atraumatic.  Eyes:     Conjunctiva/sclera: Conjunctivae normal.     Pupils: Pupils are equal, round, and reactive to light.  Cardiovascular:     Heart sounds: Normal heart sounds.  Pulmonary:     Effort: Pulmonary effort is normal.  Abdominal:     Palpations: Abdomen is soft.  Musculoskeletal:        General: Normal range of motion.     Cervical back: Normal range of motion.  Skin:    General: Skin is warm and dry.  Neurological:     General: No focal deficit present.     Mental Status: She is alert.  Psychiatric:        Mood and Affect: Mood normal.     Review of Systems  Constitutional: Negative.   HENT: Negative.   Eyes: Negative.   Respiratory: Negative.   Cardiovascular: Negative.   Gastrointestinal: Negative.   Musculoskeletal: Negative.   Skin: Negative.   Neurological: Negative.   Psychiatric/Behavioral: Negative.     Blood pressure 116/90, pulse 81, temperature 97.7 F (36.5 C), temperature source Oral, resp. rate 18, height '5\' 6"'$  (1.676 m), weight 84.5 kg, SpO2 100 %.Body mass index is 30.07 kg/m.  General Appearance: Casual  Eye Contact:  Fair  Speech:  Clear and Coherent  Volume:  Normal  Mood:  Euthymic  Affect:  Congruent  Thought Process:  Goal Directed  Orientation:  Full (Time, Place, and Person)  Thought Content:  Logical  Suicidal Thoughts:  No  Homicidal Thoughts:  No  Memory:  Immediate;   Fair Recent;   Fair Remote;   Fair  Judgement:  Impaired  Insight:  Shallow  Psychomotor Activity:  Normal   Concentration:  Concentration: Poor  Recall:  [employer] of Knowledge:  Fair  Language:  Fair  Akathisia:  No  Handed:  Right  AIMS (if indicated):     Assets:  Desire for Improvement Resilience  ADL's:  Intact  Cognition:  Impaired,  Mild  Sleep:        Treatment Plan Summary: Plan No new diagnoses no change in overall assessment.  As I said in my last note the patient has demonstrated no ability to come up with an adequate plan for herself but when offered options expresses a willingness to make a choice.  Patient is claiming that she had no idea she was to be discharged tomorrow.  No idea whether that is true or  not.  Now that she has heard it directly from me at least she says that she will try and call some people to have a place to stay.  No change to overall assessment.  No need for any psychiatric intervention or treatment at this point.  Disposition: No evidence of imminent risk to self or others at present.   Patient does not meet criteria for psychiatric inpatient admission. Supportive therapy provided about ongoing stressors. Discussed crisis plan, support from social network, calling 911, coming to the Emergency Department, and calling Suicide Hotline.  Alethia Berthold, MD 01/28/2020 6:22 PM

## 2020-01-29 LAB — CBC WITH DIFFERENTIAL/PLATELET
Abs Immature Granulocytes: 0.01 10*3/uL (ref 0.00–0.07)
Basophils Absolute: 0.1 10*3/uL (ref 0.0–0.1)
Basophils Relative: 2 %
Eosinophils Absolute: 0.1 10*3/uL (ref 0.0–0.5)
Eosinophils Relative: 2 %
HCT: 36.6 % (ref 36.0–46.0)
Hemoglobin: 11 g/dL — ABNORMAL LOW (ref 12.0–15.0)
Immature Granulocytes: 0 %
Lymphocytes Relative: 28 %
Lymphs Abs: 1.1 10*3/uL (ref 0.7–4.0)
MCH: 21.4 pg — ABNORMAL LOW (ref 26.0–34.0)
MCHC: 30.1 g/dL (ref 30.0–36.0)
MCV: 71.3 fL — ABNORMAL LOW (ref 80.0–100.0)
Monocytes Absolute: 0.6 10*3/uL (ref 0.1–1.0)
Monocytes Relative: 15 %
Neutro Abs: 2.1 10*3/uL (ref 1.7–7.7)
Neutrophils Relative %: 53 %
Platelets: 361 10*3/uL (ref 150–400)
RBC: 5.13 MIL/uL — ABNORMAL HIGH (ref 3.87–5.11)
RDW: 18 % — ABNORMAL HIGH (ref 11.5–15.5)
WBC: 4 10*3/uL (ref 4.0–10.5)
nRBC: 0 % (ref 0.0–0.2)

## 2020-01-29 LAB — CYTOLOGY - NON PAP

## 2020-01-29 LAB — BASIC METABOLIC PANEL
Anion gap: 14 (ref 5–15)
BUN: 73 mg/dL — ABNORMAL HIGH (ref 8–23)
CO2: 21 mmol/L — ABNORMAL LOW (ref 22–32)
Calcium: 10.1 mg/dL (ref 8.9–10.3)
Chloride: 101 mmol/L (ref 98–111)
Creatinine, Ser: 3.88 mg/dL — ABNORMAL HIGH (ref 0.44–1.00)
GFR, Estimated: 12 mL/min — ABNORMAL LOW (ref >60)
Glucose, Bld: 103 mg/dL — ABNORMAL HIGH (ref 70–99)
Potassium: 4.2 mmol/L (ref 3.5–5.1)
Sodium: 136 mmol/L (ref 135–145)

## 2020-01-29 LAB — MAGNESIUM: Magnesium: 2.1 mg/dL (ref 1.7–2.4)

## 2020-01-29 MED ORDER — THIOTHIXENE 1 MG PO CAPS: 1.0000 mg | ORAL_CAPSULE | Freq: Two times a day (BID) | ORAL | 2 refills | Status: DC

## 2020-01-29 MED ORDER — PANTOPRAZOLE SODIUM 40 MG PO TBEC: 40.0000 mg | DELAYED_RELEASE_TABLET | Freq: Two times a day (BID) | ORAL | 2 refills | Status: DC

## 2020-01-29 MED ORDER — CIPROFLOXACIN HCL 250 MG PO TABS: 250.0000 mg | ORAL_TABLET | Freq: Every day | ORAL | 2 refills | Status: DC

## 2020-01-29 MED ORDER — FERROUS SULFATE 325 (65 FE) MG PO TABS: 325.0000 mg | ORAL_TABLET | Freq: Every day | ORAL | 2 refills | Status: DC

## 2020-01-29 MED ORDER — ALBUTEROL SULFATE HFA 108 (90 BASE) MCG/ACT IN AERS: 2.0000 | INHALATION_SPRAY | Freq: Four times a day (QID) | RESPIRATORY_TRACT | 2 refills | Status: AC | PRN

## 2020-01-29 MED ORDER — FLUOXETINE HCL 10 MG PO CAPS: 30.0000 mg | ORAL_CAPSULE | Freq: Every day | ORAL | 2 refills | Status: DC

## 2020-01-29 MED ORDER — APIXABAN 5 MG PO TABS: 5.0000 mg | ORAL_TABLET | Freq: Two times a day (BID) | ORAL | 2 refills | Status: DC

## 2020-01-29 MED ORDER — HYDROCORTISONE 0.5 % EX CREA: TOPICAL_CREAM | Freq: Three times a day (TID) | CUTANEOUS | 0 refills | Status: DC | PRN

## 2020-01-29 MED ORDER — BUMETANIDE 2 MG PO TABS: 2.0000 mg | ORAL_TABLET | Freq: Two times a day (BID) | ORAL | 2 refills | Status: DC

## 2020-01-29 MED ORDER — SPIRONOLACTONE 25 MG PO TABS: 25.0000 mg | ORAL_TABLET | Freq: Every day | ORAL | 2 refills | Status: DC

## 2020-01-29 MED ORDER — MIDODRINE HCL 10 MG PO TABS: 10.0000 mg | ORAL_TABLET | Freq: Three times a day (TID) | ORAL | 2 refills | Status: DC

## 2020-01-29 MED ORDER — ATORVASTATIN CALCIUM 20 MG PO TABS
20.0000 mg | ORAL_TABLET | Freq: Every day | ORAL | 2 refills | Status: DC
Start: 2020-01-29 — End: 2020-02-24

## 2020-01-29 MED ORDER — ALLOPURINOL 100 MG PO TABS: 100.0000 mg | ORAL_TABLET | Freq: Every day | ORAL | 2 refills | Status: DC

## 2020-01-29 NOTE — Progress Notes (Addendum)
Patient is stable and ready for discharge. MD orders in to discharge this patient at this time. Patient stated she has a ride (nephew0 that is picking her up and that she has a place to go. Patient packed her belongings x3 bags and RW and dressed herself. NT in to transport patient to medical mall to her ride for discharge.

## 2020-01-29 NOTE — Discharge Summary (Signed)
Physician Discharge Summary   Katelyn Lane [id number] DOB: 06-24-1955 DOA: 10/19/2019  PCP: [name], MD  Admit date: 10/19/2019 Discharge date: 01/29/2020  Admitted From: home Disposition:  home Discharging physician: Dwyane Dee, MD  Recommendations for Outpatient Follow-up:  1. Would benefit from establishing care with cardiology and GI 2. Intermittent EKGs while on cipro for SBP ppx (indefinite per GI) 3. Prognosis long term is poor especially with any drug/etoh use and/or noncompliance   Patient discharged to home in Discharge Condition: stable CODE STATUS: DNR Diet recommendation:  Diet Orders (From admission, onward)    Start     Ordered   01/29/20 0000  Diet - low sodium heart healthy        01/29/20 1050   01/14/20 1219  Diet Heart Room service appropriate? Yes; Fluid consistency: Thin  Diet effective now       Question Answer Comment  Room service appropriate? Yes   Fluid consistency: Thin      01/14/20 1218   01/08/20 0000  Diet - low sodium heart healthy        01/08/20 1504          Hospital Course: Katelyn Lane is a 65 y.o. 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    After admission to the hospital, she was diagnosed with exacerbation of acute on chronic combined systolic and diastolic congestive heart failure with ejection fraction 25 to 30% (last echo 06/22/19).   She was given a trial of Lasix which she did not respond to.  She was then evaluated by nephrology and underwent temporary dialysis catheter placement and was dialyzed on 10/17 and 10/18.  Kidney function then improved and she has been having adequate urine output although renal function remains declined. She also had abdominal pain and distention.  Imaging was consistent with alcoholic cirrhosis with  ascites and she has undergone serial paracenteses throughout hospitalization.  Initial paracentesis showed SBP and she required treatment with Rocephin and Flagyl.  She was evaluated by GI with recommendations for lifelong SBP prophylaxis and is currently on ciprofloxacin.  Also during hospitalization she had an aspiration event on 10/25/2019 and was treated with antibiotic course as well. Following this on 10/27/2019 she developed worsening abdominal pain and was evaluated by general surgery and she was not considered to warrant any intervention and underlying ischemia was ruled out.  She was most recently assessed by DSS SW and considered to have capacity. I also assessed patient prior to discharge and she does express ability to understand decisions and consequences, and therefore the capacity to make her decisions (regardless of her choices, e.g. hx drug abuse).  She was able to show understanding of her choices presented which were multiple group homes offered by the hospital.  She expressed her choice time and time again which was to decline every offered group home.  She showed "appreciation" of the decision with the reasoning that each offered group home was either "too far away" or that she simply "did not want to go because they might not be nice".  Lastly, she showed reasoning with the decision stating that she knew the alternative consequence was not only finding shelter on her own (has a notebook of apartments and hotels to go stay in) but that she would be homeless otherwise.  In order to avoid this, she had  called her friend that she told me was named Lavenia Atlas who also lives in [place] and that she would go stay with her upon discharge from the hospital.  Furthermore, she called her nephew who was her pick up ride from the hospital at time of discharge as well. She was strongly encouraged to avoid any further illicit drug use or alcohol use given her multiorgan disease. She was stable  at time of discharge and prescriptions were sent to her pharmacy.  Outpatient follow-up appointment with her PCP was also arranged prior to discharge.   * Spontaneous bacterial peritonitis (HCC)-resolved as of 01/28/2020 - s/p treatment on admission  - now on cipro for ppx indefinitely per GI -Cipro dose at discharge 250 mg daily for renal function - EKG was obtained on 01/28/2020.  QTc 486, sinus rhythm with PVCs and PACs.  LAD appreciated - She will need intermittent EKG monitoring at follow-up appointments while on chronic Cipro  Alcoholic cirrhosis of liver with ascites ([place]) - she is starting to require a paracentesis approx every 3 weeks - last paracentesis 1/19, removed 5.3 L (negative for SBP) -Cytology from 01/27/2020 specimen is negative for malignancy - continue bumex and spironolactone - would benefit from ongoing care with GI. May need EGD in future  Acute on chronic combined systolic (congestive) and diastolic (congestive) heart failure (HCC) - TTE from 06/22/19 shows EF 25 to 30% with grade 3 DD - Ptstated shedoes not want to get dialysis again. -Continue Eliquis, Bumex, spironolactone  Multiple organ failure with heart failure (HCC) - cardiac, renal, hepatic  - prognosis very guarded to poor long term especially if any deviation of her medications and/or further cocaine/alcohol use - patient already declining further HD? Not good for her prognosis either long term if needed - will need to keep [facility] discussions ongoing outpatient - currently DNR  Goals of care, counseling/discussion - patient is feasibly homeless; now in hospital 100+ days; she has turned down offered venues for discharge (group home and transitional home, and others).  - per SW with DSS patient has capacity - see hospital course above regarding capacity determined prior to discharge   Acute renal failure superimposed on stage 3a chronic kidney disease ([place]) - etiology HRS vs CRS vs combo - Baseline  creat appears to be possibly 2.2 -Renal function again starting to variate - s/p short term HD 10/17 and 10/18; patient stating she would not want HD again; also is a DNR  Acute respiratory failure with hypoxia (HCC)-resolved as of 01/28/2020 - s/p weaned off O2, also passed ambulatory test with desatting  Aspiration pneumonia of both lower lobes due to gastric secretions (HCC)-resolved as of 01/28/2020 - s/p abx course   Hyponatremia - Some degree of hyponatremia expected in setting of CHF and cirrhosis  Constipation - continue bowel regimen   GERD (gastroesophageal reflux disease) - continue PPI   Hypotension, chronic - expected BP 90/60s in setting of cirrhosis - currently on midodrine, ok to continue for now  Normocytic anemia - Hemoglobin stable - continue iron  Cardiorenal disease - continue monitoring renal function - continue CHF meds - will need outpatient management with cardiology and nephrology, but compliance and other factors remain a concern   Cocaine abuse ([place]) - patient has been informed that any further etoh or substance use will be extremely detrimental to her health   History of CVA (cerebrovascular accident) - continue Eliquis  LV (left ventricular) mural thrombus-resolved as of 01/28/2020 - no thrombus seen on  last echo on 06/22/19; she remains on Eliquis for hx of CVA    The patient's chronic medical conditions were treated accordingly per the patient's home medication regimen except as noted.  On day of discharge, patient was felt deemed stable for discharge. Patient/family member advised to call PCP or come back to ER if needed.   Principal Diagnosis: Spontaneous bacterial peritonitis [facility])  Discharge Diagnoses: Active Hospital Problems   Diagnosis Date Noted  . Alcoholic cirrhosis of liver with ascites ([place])     Priority: High  . Acute on chronic combined systolic (congestive) and diastolic (congestive) heart failure ([place]) 06/03/2019     Priority: High  . Multiple organ failure with heart failure ([place]) 01/28/2020    Priority: Medium  . Goals of care, counseling/discussion     Priority: Medium  . Acute renal failure superimposed on stage 3a chronic kidney disease ([place]) 03/06/2019    Priority: Medium  . Normocytic anemia 01/28/2020  . Hypotension, chronic 01/28/2020  . GERD (gastroesophageal reflux disease) 01/28/2020  . Constipation 01/28/2020  . Hyponatremia 01/28/2020  . Cardiorenal disease   . Palliative care by specialist   . Cocaine abuse ([place]) 10/20/2019  . History of CVA (cerebrovascular accident) 01/18/2019    Resolved Hospital Problems   Diagnosis Date Noted Date Resolved  . Spontaneous bacterial peritonitis ([place]) 10/31/2019 01/28/2020    Priority: High  . Acute respiratory failure with hypoxia [facility]) 03/06/2019 01/28/2020    Priority: Medium  . Aspiration pneumonia of both lower lobes due to gastric secretions [facility]) 11/02/2019 01/28/2020    Priority: Low  . LV (left ventricular) mural thrombus 03/06/2019 01/28/2020    Discharge Instructions    Diet - low sodium heart healthy   Complete by: As directed    Diet - low sodium heart healthy   Complete by: As directed    Discharge instructions   Complete by: As directed    F/u w/ PCP in 1 week. F/u GI in 1 week. F/u w/ cardio in 1-2 weeks   Increase activity slowly   Complete by: As directed    Increase activity slowly   Complete by: As directed    No wound care   Complete by: As directed    No wound care   Complete by: As directed      Allergies as of 01/29/2020      Reactions   Penicillins Hives, Rash, Other (See Comments)   Did it involve swelling of the face/tongue/throat, SOB, or low BP? No Did it involve sudden or severe rash/hives, skin peeling, or any reaction on the inside of your mouth or nose? Yes Did you need to seek medical attention at a hospital or doctor's office? Yes When did it last happen? >25 years If all above answers  are "NO", may proceed with cephalosporin use.   Ace Inhibitors Nausea And Vomiting      Medication List    STOP taking these medications   aspirin 81 MG chewable tablet   benztropine 0.5 MG tablet Commonly known as: COGENTIN   carvedilol 6.25 MG tablet Commonly known as: COREG   docusate calcium 240 MG capsule Commonly known as: SURFAK   Entresto 49-51 MG Generic drug: sacubitril-valsartan   gabapentin 300 MG capsule Commonly known as: NEURONTIN   Ipratropium-Albuterol 20-100 MCG/ACT Aers respimat Commonly known as: COMBIVENT   rivaroxaban 20 MG Tabs tablet Commonly known as: XARELTO     TAKE these medications   albuterol 108 (90 Base) MCG/ACT inhaler Commonly known as:  VENTOLIN HFA Inhale 2 puffs into the lungs every 6 (six) hours as needed for wheezing or shortness of breath.   allopurinol 100 MG tablet Commonly known as: Zyloprim Take 1 tablet (100 mg total) by mouth daily. What changed: Another medication with the same name was added. Make sure you understand how and when to take each.   allopurinol 100 MG tablet Commonly known as: Zyloprim Take 1 tablet (100 mg total) by mouth daily. What changed: You were already taking a medication with the same name, and this prescription was added. Make sure you understand how and when to take each.   apixaban 5 MG Tabs tablet Commonly known as: ELIQUIS Take 1 tablet (5 mg total) by mouth 2 (two) times daily.   ascorbic acid 500 MG tablet Commonly known as: VITAMIN C Take 500 mg by mouth daily.   atorvastatin 20 MG tablet Commonly known as: LIPITOR Take 1 tablet (20 mg total) by mouth daily at 6 PM. What changed: Another medication with the same name was added. Make sure you understand how and when to take each.   atorvastatin 20 MG tablet Commonly known as: LIPITOR Take 1 tablet (20 mg total) by mouth daily at 6 PM. What changed: You were already taking a medication with the same name, and this prescription was  added. Make sure you understand how and when to take each.   bumetanide 2 MG tablet Commonly known as: BUMEX Take 1 tablet (2 mg total) by mouth 2 (two) times daily. What changed:   medication strength  how much to take  additional instructions   ciprofloxacin 250 MG tablet Commonly known as: CIPRO Take 1 tablet (250 mg total) by mouth daily with breakfast. For SBP prophylaxis Start taking on: January 30, 2020   ferrous sulfate 325 (65 FE) MG tablet Take 1 tablet (325 mg total) by mouth daily with breakfast. What changed: Another medication with the same name was added. Make sure you understand how and when to take each.   ferrous sulfate 325 (65 FE) MG tablet Take 1 tablet (325 mg total) by mouth daily with breakfast. What changed: You were already taking a medication with the same name, and this prescription was added. Make sure you understand how and when to take each.   FLUoxetine 10 MG capsule Commonly known as: PROZAC Take 3 capsules (30 mg total) by mouth daily. What changed: Another medication with the same name was added. Make sure you understand how and when to take each.   FLUoxetine 10 MG capsule Commonly known as: PROZAC Take 3 capsules (30 mg total) by mouth daily. What changed: You were already taking a medication with the same name, and this prescription was added. Make sure you understand how and when to take each.   hydrocortisone cream 0.5 % Apply topically 3 (three) times daily as needed for itching.   loratadine 10 MG tablet Commonly known as: CLARITIN Take 10 mg by mouth daily as needed for allergies.   midodrine 10 MG tablet Commonly known as: PROAMATINE Take 1 tablet (10 mg total) by mouth 3 (three) times daily with meals. What changed:   medication strength  how much to take  when to take this  reasons to take this   nitroGLYCERIN 0.4 MG SL tablet Commonly known as: NITROSTAT Place 1 tablet (0.4 mg total) under the tongue every 5  (five) minutes as needed for chest pain.   pantoprazole 40 MG tablet Commonly known as: PROTONIX Take 1 tablet (  40 mg total) by mouth 2 (two) times daily. What changed: when to take this   spironolactone 25 MG tablet Commonly known as: ALDACTONE Take 1 tablet (25 mg total) by mouth daily. Start taking on: January 30, 2020 What changed: additional instructions   thiothixene 1 MG capsule Commonly known as: NAVANE Take 1 capsule (1 mg total) by mouth 2 (two) times daily. What changed: Another medication with the same name was added. Make sure you understand how and when to take each.   thiothixene 1 MG capsule Commonly known as: NAVANE Take 1 capsule (1 mg total) by mouth 2 (two) times daily. What changed: You were already taking a medication with the same name, and this prescription was added. Make sure you understand how and when to take each.            Durable Medical Equipment  (From admission, onward)         Start     Ordered   12/30/19 1402  For home use only DME Walker rolling  Once       Question Answer Comment  Walker: With 5 Inch Wheels   Patient needs a walker to treat with the following condition Weakness      12/30/19 1401          Follow-up Information    Revelo, Elyse Jarvis, MD. Go on 02/09/2020.   Specialty: Family Medicine Why: Please follow up February 1st at 9:20am with Imagene Gurney.  Contact information: [address] [place] [address] [phone]              Allergies  Allergen Reactions  . Penicillins Hives, Rash and Other (See Comments)    Did it involve swelling of the face/tongue/throat, SOB, or low BP? No Did it involve sudden or severe rash/hives, skin peeling, or any reaction on the inside of your mouth or nose? Yes Did you need to seek medical attention at a hospital or doctor's office? Yes When did it last happen? >25 years If all above answers are "NO", may proceed with cephalosporin use.   .  Ace Inhibitors Nausea And Vomiting    Discharge Exam: BP 111/78 (BP Location: Right Arm)   Pulse 73   Temp 98 F (36.7 C)   Resp 17   Ht '5\' 6"'$  (1.676 m)   Wt 84.5 kg   SpO2 100%   BMI 30.07 kg/m  General appearance: alert, cooperative and no distress Head: Normocephalic, without obvious abnormality, atraumatic Eyes: EOMI Lungs: clear to auscultation bilaterally Heart: irregularly irregular rhythm, S1, S2 normal and 3/6 HSM heard throughout precordium Abdomen: obese, soft, ND; BS present; NT Extremities: chronic 1-2+ LE edema Skin: mobility and turgor normal Neurologic: Grossly normal  The results of significant diagnostics from this hospitalization (including imaging, microbiology, ancillary and laboratory) are listed below for reference.   Microbiology: Recent Results (from the past 240 hour(s))  Body fluid culture     Status: None (Preliminary result)   Collection Time: 01/27/20  4:05 PM   Specimen: PATH Cytology Peritoneal fluid  Result Value Ref Range Status   Specimen Description   Final    PERITONEAL Performed at [facility], [address], [place], [place] [address]    Special Requests   Final    NONE Performed at [facility], [address] [place], [place] [address]    Gram Stain NO WBC SEEN NO ORGANISMS SEEN   Final   Culture   Final  NO GROWTH 1 DAY Performed at [facility], [place] [address], [place], [place] [address]    Report Status PENDING  Incomplete     Labs: BNP (last 3 results) Recent Labs    08/27/19 1551 08/28/19 0940 10/19/19 0935  BNP 3,266.2* 3,245.7* [id number]*   Basic Metabolic Panel: Recent Labs  Lab 01/25/20 0534 01/26/20 0522 01/29/20 0808  NA 137 139 136  K 3.9 4.6 4.2  CL 104 102 101  CO2 23 24 21*  GLUCOSE 117* 95 103*  BUN 62* 61* 73*  CREATININE 3.14* 3.35* 3.88*  CALCIUM 9.6 9.9 10.1  MG  --   --  2.1   Liver Function Tests: No results for input(s): AST, ALT, ALKPHOS, BILITOT,  PROT, ALBUMIN in the last 168 hours. No results for input(s): LIPASE, AMYLASE in the last 168 hours. No results for input(s): AMMONIA in the last 168 hours. CBC: Recent Labs  Lab 01/23/20 0529 01/25/20 0558 01/26/20 0522 01/29/20 0808  WBC 5.4 4.4 4.5 4.0  NEUTROABS  --   --   --  2.1  HGB 10.7* 10.2* 10.8* 11.0*  HCT 35.2* 33.7* 35.3* 36.6  MCV 72.1* 70.6* 71.3* 71.3*  PLT 414* 373 382 361   Cardiac Enzymes: No results for input(s): CKTOTAL, CKMB, CKMBINDEX, TROPONINI in the last 168 hours. BNP: Invalid input(s): POCBNP CBG: No results for input(s): GLUCAP in the last 168 hours. D-Dimer No results for input(s): DDIMER in the last 72 hours. Hgb A1c No results for input(s): HGBA1C in the last 72 hours. Lipid Profile No results for input(s): CHOL, HDL, LDLCALC, TRIG, CHOLHDL, LDLDIRECT in the last 72 hours. Thyroid function studies No results for input(s): TSH, T4TOTAL, T3FREE, THYROIDAB in the last 72 hours.  Invalid input(s): FREET3 Anemia work up No results for input(s): VITAMINB12, FOLATE, FERRITIN, TIBC, IRON, RETICCTPCT in the last 72 hours. Urinalysis    Component Value Date/Time   COLORURINE YELLOW (A) 07/27/2019 0836   APPEARANCEUR CLEAR (A) 07/27/2019 0836   LABSPEC 1.009 07/27/2019 0836   PHURINE 6.0 07/27/2019 0836   GLUCOSEU NEGATIVE 07/27/2019 0836   HGBUR NEGATIVE 07/27/2019 0836   BILIRUBINUR NEGATIVE 07/27/2019 0836   KETONESUR NEGATIVE 07/27/2019 0836   PROTEINUR NEGATIVE 07/27/2019 0836   NITRITE NEGATIVE 07/27/2019 0836   LEUKOCYTESUR NEGATIVE 07/27/2019 0836   Sepsis Labs Invalid input(s): PROCALCITONIN,  WBC,  LACTICIDVEN Microbiology Recent Results (from the past 240 hour(s))  Body fluid culture     Status: None (Preliminary result)   Collection Time: 01/27/20  4:05 PM   Specimen: PATH Cytology Peritoneal fluid  Result Value Ref Range Status   Specimen Description   Final    PERITONEAL Performed at [facility], [address], [place], [place] [address]    Special Requests   Final    NONE Performed at [facility], [place], [place] [address]    Gram Stain NO WBC SEEN NO ORGANISMS SEEN   Final   Culture   Final    NO GROWTH 1 DAY Performed at [facility], [place] [address] [place], [place] [address]    Report Status PENDING  Incomplete    Procedures/Studies: US Paracentesis  Result Date: 01/27/2020 INDICATION: Cirrhosis with ascites EXAM: ULTRASOUND GUIDED  PARACENTESIS MEDICATIONS: None. COMPLICATIONS: None immediate. PROCEDURE: Informed written consent was obtained from the patient after a discussion of the risks, benefits and alternatives to treatment. A timeout was performed prior to the initiation of the procedure. Initial  ultrasound scanning demonstrates a moderate amount of ascites within the right lower abdominal quadrant. The right lower abdomen was prepped and draped in the usual sterile fashion. 1% lidocaine was used for local anesthesia. Following this, a 6 Fr Safe-T-Centesis catheter was introduced. An ultrasound image was saved for documentation purposes. The paracentesis was performed. The catheter was removed and a dressing was applied. The patient tolerated the procedure well without immediate post procedural complication. FINDINGS: A total of approximately 5.3 L of straw-colored fluid was removed. Samples were sent to the laboratory as requested by the clinical team. IMPRESSION: Successful ultrasound-guided paracentesis yielding 5.3 liters of peritoneal fluid. Electronically Signed   By: Miachel Roux M.D.   On: 01/27/2020 16:57   US Paracentesis  Result Date: 01/11/2020 INDICATION: Patient with history of chronic heart failure, chronic kidney disease and alcoholic cirrhosis with recurrent ascites. Request is for therapeutic paracentesis. Maximum of 4 L. EXAM: ULTRASOUND GUIDED THERAPEUTIC PARACENTESIS MEDICATIONS: Lidocaine 1% 10 mL COMPLICATIONS: None immediate.  PROCEDURE: Informed written consent was obtained from the patient after a discussion of the risks, benefits and alternatives to treatment. A timeout was performed prior to the initiation of the procedure. Initial ultrasound scanning demonstrates a large amount of ascites within the right lower abdominal quadrant. The right lower abdomen was prepped and draped in the usual sterile fashion. 1% lidocaine was used for local anesthesia. Following this, a 6 Fr Safe-T-Centesis catheter was introduced. An ultrasound image was saved for documentation purposes. The paracentesis was performed. The catheter was removed and a dressing was applied. The patient tolerated the procedure well without immediate post procedural complication. FINDINGS: A total of approximately 4 L of straw-colored fluid was removed. IMPRESSION: Successful ultrasound-guided therapeutic paracentesis yielding 4 liters of peritoneal fluid. Read by: Rushie Nyhan, NP Electronically Signed   By: Markus Daft M.D.   On: 01/11/2020 10:56   DG Chest Port 1 View  Result Date: 01/06/2020 CLINICAL DATA:  Shortness of breath EXAM: PORTABLE CHEST 1 VIEW COMPARISON:  11/15/2019 FINDINGS: Trace left pleural effusion. Left basilar atelectasis. Mild bilateral interstitial thickening. No pneumothorax. Stable cardiomegaly. No acute osseous abnormality. IMPRESSION: Cardiomegaly with pulmonary vascular congestion. Electronically Signed   By: Kathreen Devoid   On: 01/06/2020 15:06   CT MAXILLOFACIAL WO CONTRAST  Result Date: 01/22/2020 CLINICAL DATA:  Facial pain and sinus drainage EXAM: CT MAXILLOFACIAL WITHOUT CONTRAST TECHNIQUE: Multidetector CT imaging of the maxillofacial structures was performed. Multiplanar CT image reconstructions were also generated. COMPARISON:  None. FINDINGS: Osseous: No fracture or mandibular dislocation. No destructive process. Orbits: Negative. No traumatic or inflammatory finding. Sinuses: Moderate left maxillary sinus mucosal  thickening. The other paranasal sinuses are clear. No mastoid effusion. Soft tissues: Negative. Limited intracranial: Normal IMPRESSION: Moderate left maxillary sinus mucosal thickening. Correlate for symptoms of acute sinusitis. Electronically Signed   By: Ulyses Jarred M.D.   On: 01/22/2020 23:02     Time coordinating discharge: Over 30 minutes    Dwyane Dee, MD  [facility] 01/29/2020, 4:26 PM

## 2020-01-29 NOTE — Progress Notes (Signed)
   01/29/20 0930  Clinical Encounter Type  Visited With Patient  Visit Type Initial;Spiritual support;Social support  Referral From Nurse  Consult/Referral To [place] visited [name] in room 146A per the nurse. I spoke with Pt about her be discharged. She stated, "they are putting me out of here, so I got to find somewhere to go. "I used reflective listening, provided words of encouragement and prayed for Pt before exiting the room

## 2020-01-31 LAB — BODY FLUID CULTURE
Culture: NO GROWTH
Gram Stain: NONE SEEN

## 2020-02-16 ENCOUNTER — Emergency Department: Payer: Medicaid Other

## 2020-02-16 ENCOUNTER — Other Ambulatory Visit: Payer: Self-pay

## 2020-02-16 ENCOUNTER — Inpatient Hospital Stay
Admission: EM | Admit: 2020-02-16 | Discharge: 2020-02-24 | DRG: 682 | Disposition: A | Discharge status: Hospice | Payer: Medicaid Other | Attending: Internal Medicine | Admitting: Internal Medicine

## 2020-02-16 DIAGNOSIS — J690 Pneumonitis due to inhalation of food and vomit: Secondary | ICD-10-CM | POA: Diagnosis present

## 2020-02-16 DIAGNOSIS — D631 Anemia in chronic kidney disease: Secondary | ICD-10-CM | POA: Diagnosis present

## 2020-02-16 DIAGNOSIS — E876 Hypokalemia: Secondary | ICD-10-CM | POA: Diagnosis not present

## 2020-02-16 DIAGNOSIS — Z515 Encounter for palliative care: Secondary | ICD-10-CM

## 2020-02-16 DIAGNOSIS — Z20822 Contact with and (suspected) exposure to covid-19: Secondary | ICD-10-CM | POA: Diagnosis present

## 2020-02-16 DIAGNOSIS — J9811 Atelectasis: Secondary | ICD-10-CM | POA: Diagnosis present

## 2020-02-16 DIAGNOSIS — I472 Ventricular tachycardia: Secondary | ICD-10-CM | POA: Diagnosis present

## 2020-02-16 DIAGNOSIS — Z66 Do not resuscitate: Secondary | ICD-10-CM | POA: Diagnosis not present

## 2020-02-16 DIAGNOSIS — R042 Hemoptysis: Secondary | ICD-10-CM | POA: Diagnosis not present

## 2020-02-16 DIAGNOSIS — M25562 Pain in left knee: Secondary | ICD-10-CM | POA: Diagnosis present

## 2020-02-16 DIAGNOSIS — R7401 Elevation of levels of liver transaminase levels: Secondary | ICD-10-CM | POA: Diagnosis present

## 2020-02-16 DIAGNOSIS — R7881 Bacteremia: Secondary | ICD-10-CM | POA: Diagnosis not present

## 2020-02-16 DIAGNOSIS — F320 Major depressive disorder, single episode, mild: Secondary | ICD-10-CM | POA: Diagnosis not present

## 2020-02-16 DIAGNOSIS — R809 Proteinuria, unspecified: Secondary | ICD-10-CM | POA: Diagnosis present

## 2020-02-16 DIAGNOSIS — F1410 Cocaine abuse, uncomplicated: Secondary | ICD-10-CM | POA: Diagnosis present

## 2020-02-16 DIAGNOSIS — W06XXXA Fall from bed, initial encounter: Secondary | ICD-10-CM | POA: Diagnosis present

## 2020-02-16 DIAGNOSIS — M79671 Pain in right foot: Secondary | ICD-10-CM | POA: Diagnosis not present

## 2020-02-16 DIAGNOSIS — I420 Dilated cardiomyopathy: Secondary | ICD-10-CM | POA: Diagnosis present

## 2020-02-16 DIAGNOSIS — E872 Acidosis: Secondary | ICD-10-CM | POA: Diagnosis present

## 2020-02-16 DIAGNOSIS — M6282 Rhabdomyolysis: Secondary | ICD-10-CM | POA: Diagnosis present

## 2020-02-16 DIAGNOSIS — B954 Other streptococcus as the cause of diseases classified elsewhere: Secondary | ICD-10-CM | POA: Diagnosis not present

## 2020-02-16 DIAGNOSIS — D509 Iron deficiency anemia, unspecified: Secondary | ICD-10-CM | POA: Diagnosis present

## 2020-02-16 DIAGNOSIS — E785 Hyperlipidemia, unspecified: Secondary | ICD-10-CM | POA: Diagnosis present

## 2020-02-16 DIAGNOSIS — M25462 Effusion, left knee: Secondary | ICD-10-CM | POA: Diagnosis present

## 2020-02-16 DIAGNOSIS — R4182 Altered mental status, unspecified: Secondary | ICD-10-CM

## 2020-02-16 DIAGNOSIS — S7011XA Contusion of right thigh, initial encounter: Secondary | ICD-10-CM | POA: Diagnosis present

## 2020-02-16 DIAGNOSIS — E860 Dehydration: Secondary | ICD-10-CM | POA: Diagnosis present

## 2020-02-16 DIAGNOSIS — Z7189 Other specified counseling: Secondary | ICD-10-CM | POA: Diagnosis not present

## 2020-02-16 DIAGNOSIS — W19XXXA Unspecified fall, initial encounter: Secondary | ICD-10-CM

## 2020-02-16 DIAGNOSIS — I513 Intracardiac thrombosis, not elsewhere classified: Secondary | ICD-10-CM | POA: Diagnosis present

## 2020-02-16 DIAGNOSIS — Z7901 Long term (current) use of anticoagulants: Secondary | ICD-10-CM

## 2020-02-16 DIAGNOSIS — F1911 Other psychoactive substance abuse, in remission: Secondary | ICD-10-CM | POA: Diagnosis not present

## 2020-02-16 DIAGNOSIS — I5042 Chronic combined systolic (congestive) and diastolic (congestive) heart failure: Secondary | ICD-10-CM | POA: Diagnosis present

## 2020-02-16 DIAGNOSIS — I5022 Chronic systolic (congestive) heart failure: Secondary | ICD-10-CM | POA: Diagnosis present

## 2020-02-16 DIAGNOSIS — G9341 Metabolic encephalopathy: Secondary | ICD-10-CM | POA: Diagnosis present

## 2020-02-16 DIAGNOSIS — Z888 Allergy status to other drugs, medicaments and biological substances status: Secondary | ICD-10-CM

## 2020-02-16 DIAGNOSIS — M109 Gout, unspecified: Secondary | ICD-10-CM | POA: Diagnosis present

## 2020-02-16 DIAGNOSIS — N184 Chronic kidney disease, stage 4 (severe): Secondary | ICD-10-CM | POA: Diagnosis present

## 2020-02-16 DIAGNOSIS — K7460 Unspecified cirrhosis of liver: Secondary | ICD-10-CM | POA: Diagnosis not present

## 2020-02-16 DIAGNOSIS — F32A Depression, unspecified: Secondary | ICD-10-CM | POA: Diagnosis present

## 2020-02-16 DIAGNOSIS — Z79899 Other long term (current) drug therapy: Secondary | ICD-10-CM

## 2020-02-16 DIAGNOSIS — Z8673 Personal history of transient ischemic attack (TIA), and cerebral infarction without residual deficits: Secondary | ICD-10-CM

## 2020-02-16 DIAGNOSIS — R7989 Other specified abnormal findings of blood chemistry: Secondary | ICD-10-CM | POA: Diagnosis present

## 2020-02-16 DIAGNOSIS — R0902 Hypoxemia: Secondary | ICD-10-CM

## 2020-02-16 DIAGNOSIS — D696 Thrombocytopenia, unspecified: Secondary | ICD-10-CM | POA: Diagnosis present

## 2020-02-16 DIAGNOSIS — K7031 Alcoholic cirrhosis of liver with ascites: Secondary | ICD-10-CM | POA: Diagnosis not present

## 2020-02-16 DIAGNOSIS — K219 Gastro-esophageal reflux disease without esophagitis: Secondary | ICD-10-CM | POA: Diagnosis present

## 2020-02-16 DIAGNOSIS — I4729 Other ventricular tachycardia: Secondary | ICD-10-CM

## 2020-02-16 DIAGNOSIS — R040 Epistaxis: Secondary | ICD-10-CM | POA: Diagnosis not present

## 2020-02-16 DIAGNOSIS — S71111A Laceration without foreign body, right thigh, initial encounter: Secondary | ICD-10-CM | POA: Diagnosis present

## 2020-02-16 DIAGNOSIS — M79672 Pain in left foot: Secondary | ICD-10-CM | POA: Diagnosis not present

## 2020-02-16 DIAGNOSIS — I959 Hypotension, unspecified: Secondary | ICD-10-CM | POA: Diagnosis not present

## 2020-02-16 DIAGNOSIS — N179 Acute kidney failure, unspecified: Principal | ICD-10-CM | POA: Diagnosis present

## 2020-02-16 DIAGNOSIS — F17200 Nicotine dependence, unspecified, uncomplicated: Secondary | ICD-10-CM | POA: Diagnosis present

## 2020-02-16 DIAGNOSIS — I130 Hypertensive heart and chronic kidney disease with heart failure and stage 1 through stage 4 chronic kidney disease, or unspecified chronic kidney disease: Secondary | ICD-10-CM | POA: Diagnosis present

## 2020-02-16 DIAGNOSIS — G9340 Encephalopathy, unspecified: Secondary | ICD-10-CM | POA: Diagnosis not present

## 2020-02-16 DIAGNOSIS — D649 Anemia, unspecified: Secondary | ICD-10-CM | POA: Diagnosis not present

## 2020-02-16 DIAGNOSIS — J449 Chronic obstructive pulmonary disease, unspecified: Secondary | ICD-10-CM | POA: Diagnosis present

## 2020-02-16 DIAGNOSIS — R0603 Acute respiratory distress: Secondary | ICD-10-CM | POA: Diagnosis not present

## 2020-02-16 DIAGNOSIS — G253 Myoclonus: Secondary | ICD-10-CM | POA: Diagnosis present

## 2020-02-16 DIAGNOSIS — Z880 Allergy status to penicillin: Secondary | ICD-10-CM

## 2020-02-16 DIAGNOSIS — B957 Other staphylococcus as the cause of diseases classified elsewhere: Secondary | ICD-10-CM | POA: Diagnosis not present

## 2020-02-16 LAB — RESP PANEL BY RT-PCR (FLU A&B, COVID) ARPGX2
Influenza A by PCR: NEGATIVE
Influenza B by PCR: NEGATIVE
SARS Coronavirus 2 by RT PCR: NEGATIVE

## 2020-02-16 LAB — LACTIC ACID, PLASMA
Lactic Acid, Venous: 2.3 mmol/L (ref 0.5–1.9)
Lactic Acid, Venous: 1.9 mmol/L (ref 0.5–1.9)

## 2020-02-16 LAB — BLOOD GAS, ARTERIAL
Acid-base deficit: 7.9 mmol/L — ABNORMAL HIGH (ref 0.0–2.0)
Bicarbonate: 16.2 mmol/L — ABNORMAL LOW (ref 20.0–28.0)
O2 Saturation: 97.2 %
Patient temperature: 37
pCO2 arterial: 28 mmHg — ABNORMAL LOW (ref 32.0–48.0)
pH, Arterial: 7.37 (ref 7.350–7.450)
pO2, Arterial: 95 mmHg (ref 83.0–108.0)

## 2020-02-16 LAB — CBC WITH DIFFERENTIAL/PLATELET
Abs Immature Granulocytes: 0.09 10*3/uL — ABNORMAL HIGH (ref 0.00–0.07)
Basophils Absolute: 0 10*3/uL (ref 0.0–0.1)
Basophils Relative: 0 %
Eosinophils Absolute: 0 10*3/uL (ref 0.0–0.5)
Eosinophils Relative: 0 %
HCT: 34.7 % — ABNORMAL LOW (ref 36.0–46.0)
Hemoglobin: 11.1 g/dL — ABNORMAL LOW (ref 12.0–15.0)
Immature Granulocytes: 1 %
Lymphocytes Relative: 4 %
Lymphs Abs: 0.4 10*3/uL — ABNORMAL LOW (ref 0.7–4.0)
MCH: 21.8 pg — ABNORMAL LOW (ref 26.0–34.0)
MCHC: 32 g/dL (ref 30.0–36.0)
MCV: 68.2 fL — ABNORMAL LOW (ref 80.0–100.0)
Monocytes Absolute: 1 10*3/uL (ref 0.1–1.0)
Monocytes Relative: 10 %
Neutro Abs: 8.6 10*3/uL — ABNORMAL HIGH (ref 1.7–7.7)
Neutrophils Relative %: 85 %
Platelets: 158 10*3/uL (ref 150–400)
RBC: 5.09 MIL/uL (ref 3.87–5.11)
RDW: 21.5 % — ABNORMAL HIGH (ref 11.5–15.5)
Smear Review: NORMAL
WBC: 10.2 10*3/uL (ref 4.0–10.5)
nRBC: 0.9 % — ABNORMAL HIGH (ref 0.0–0.2)

## 2020-02-16 LAB — COMPREHENSIVE METABOLIC PANEL
ALT: 80 U/L — ABNORMAL HIGH (ref 0–44)
AST: 191 U/L — ABNORMAL HIGH (ref 15–41)
Albumin: 3 g/dL — ABNORMAL LOW (ref 3.5–5.0)
Alkaline Phosphatase: 158 U/L — ABNORMAL HIGH (ref 38–126)
Anion gap: 22 — ABNORMAL HIGH (ref 5–15)
BUN: 159 mg/dL — ABNORMAL HIGH (ref 8–23)
CO2: 14 mmol/L — ABNORMAL LOW (ref 22–32)
Calcium: 9.4 mg/dL (ref 8.9–10.3)
Chloride: 104 mmol/L (ref 98–111)
Creatinine, Ser: 8.15 mg/dL — ABNORMAL HIGH (ref 0.44–1.00)
GFR, Estimated: 5 mL/min — ABNORMAL LOW (ref >60)
Glucose, Bld: 75 mg/dL (ref 70–99)
Potassium: 4.8 mmol/L (ref 3.5–5.1)
Sodium: 140 mmol/L (ref 135–145)
Total Bilirubin: 2.6 mg/dL — ABNORMAL HIGH (ref 0.3–1.2)
Total Protein: 7.3 g/dL (ref 6.5–8.1)

## 2020-02-16 LAB — AMMONIA: Ammonia: 25 umol/L (ref 9–35)

## 2020-02-16 LAB — CK: Total CK: 1180 U/L — ABNORMAL HIGH (ref 38–234)

## 2020-02-16 LAB — ETHANOL: Alcohol, Ethyl (B): <10 mg/dL (ref <10)

## 2020-02-16 MED ORDER — SODIUM CHLORIDE 0.9 % IV SOLN
250.0000 mL | INTRAVENOUS | Status: DC | PRN
  Administered 2020-02-22: 250 mL via INTRAVENOUS

## 2020-02-16 MED ORDER — NITROGLYCERIN 0.4 MG SL SUBL: 0.4000 mg | SUBLINGUAL_TABLET | SUBLINGUAL | Status: DC | PRN

## 2020-02-16 MED ORDER — ASCORBIC ACID 500 MG PO TABS
500.0000 mg | ORAL_TABLET | Freq: Every day | ORAL | Status: DC
  Administered 2020-02-19 – 2020-02-23 (×5): 500 mg via ORAL
  Filled 2020-02-16 (×5): qty 1

## 2020-02-16 MED ORDER — SODIUM CHLORIDE 0.9% FLUSH
3.0000 mL | Freq: Two times a day (BID) | INTRAVENOUS | Status: DC
  Administered 2020-02-19 – 2020-02-24 (×11): 3 mL via INTRAVENOUS

## 2020-02-16 MED ORDER — APIXABAN 5 MG PO TABS
5.0000 mg | ORAL_TABLET | Freq: Two times a day (BID) | ORAL | Status: DC
  Administered 2020-02-17 (×2): 5 mg via ORAL
  Filled 2020-02-16 (×4): qty 1

## 2020-02-16 MED ORDER — FLUOXETINE HCL 10 MG PO CAPS
30.0000 mg | ORAL_CAPSULE | Freq: Every day | ORAL | Status: DC
  Administered 2020-02-17 – 2020-02-23 (×6): 30 mg via ORAL
  Filled 2020-02-16 (×10): qty 1

## 2020-02-16 MED ORDER — LORATADINE 10 MG PO TABS
10.0000 mg | ORAL_TABLET | Freq: Every day | ORAL | Status: DC | PRN
  Filled 2020-02-16: qty 1

## 2020-02-16 MED ORDER — MIDODRINE HCL 5 MG PO TABS
10.0000 mg | ORAL_TABLET | Freq: Three times a day (TID) | ORAL | Status: DC
  Administered 2020-02-17 – 2020-02-23 (×19): 10 mg via ORAL
  Filled 2020-02-16 (×21): qty 2

## 2020-02-16 MED ORDER — SODIUM CHLORIDE 0.9% FLUSH: 3.0000 mL | INTRAVENOUS | Status: DC | PRN

## 2020-02-16 MED ORDER — ONDANSETRON HCL 4 MG/2ML IJ SOLN: 4.0000 mg | Freq: Four times a day (QID) | INTRAMUSCULAR | Status: DC | PRN

## 2020-02-16 MED ORDER — ALBUTEROL SULFATE (2.5 MG/3ML) 0.083% IN NEBU
3.0000 mL | INHALATION_SOLUTION | Freq: Four times a day (QID) | RESPIRATORY_TRACT | Status: DC | PRN
  Administered 2020-02-18 – 2020-02-20 (×3): 3 mL via RESPIRATORY_TRACT
  Filled 2020-02-16 (×3): qty 3

## 2020-02-16 MED ORDER — SODIUM BICARBONATE 8.4 % IV SOLN
INTRAVENOUS | Status: DC
  Filled 2020-02-16: qty 150
  Filled 2020-02-16 (×2): qty 850
  Filled 2020-02-16 (×3): qty 150
  Filled 2020-02-16 (×5): qty 850

## 2020-02-16 MED ORDER — PANTOPRAZOLE SODIUM 40 MG PO TBEC
40.0000 mg | DELAYED_RELEASE_TABLET | Freq: Two times a day (BID) | ORAL | Status: DC
Start: 2020-02-16 — End: 2020-02-24
  Administered 2020-02-17 – 2020-02-23 (×12): 40 mg via ORAL
  Filled 2020-02-16 (×15): qty 1

## 2020-02-16 MED ORDER — ALLOPURINOL 100 MG PO TABS
100.0000 mg | ORAL_TABLET | Freq: Every day | ORAL | Status: DC
  Administered 2020-02-17 – 2020-02-23 (×6): 100 mg via ORAL
  Filled 2020-02-16 (×9): qty 1

## 2020-02-16 MED ORDER — METRONIDAZOLE IN NACL 5-0.79 MG/ML-% IV SOLN
500.0000 mg | Freq: Three times a day (TID) | INTRAVENOUS | Status: DC
  Administered 2020-02-16 – 2020-02-17 (×3): 500 mg via INTRAVENOUS
  Filled 2020-02-16 (×6): qty 100

## 2020-02-16 MED ORDER — ONDANSETRON HCL 4 MG PO TABS: 4.0000 mg | ORAL_TABLET | Freq: Four times a day (QID) | ORAL | Status: DC | PRN

## 2020-02-16 MED ORDER — SODIUM CHLORIDE 0.9 % IV SOLN
2.0000 g | INTRAVENOUS | Status: DC
  Administered 2020-02-16 – 2020-02-18 (×3): 2 g via INTRAVENOUS
  Filled 2020-02-16 (×2): qty 2
  Filled 2020-02-16 (×2): qty 20

## 2020-02-16 MED ORDER — THIOTHIXENE 1 MG PO CAPS
1.0000 mg | ORAL_CAPSULE | Freq: Two times a day (BID) | ORAL | Status: DC
  Administered 2020-02-17 – 2020-02-23 (×12): 1 mg via ORAL
  Filled 2020-02-16 (×22): qty 1

## 2020-02-16 MED ORDER — FERROUS SULFATE 325 (65 FE) MG PO TABS
325.0000 mg | ORAL_TABLET | Freq: Every day | ORAL | Status: DC
  Administered 2020-02-19 – 2020-02-23 (×5): 325 mg via ORAL
  Filled 2020-02-16 (×8): qty 1

## 2020-02-16 MED ORDER — LACTATED RINGERS IV BOLUS
1000.0000 mL | Freq: Once | INTRAVENOUS | Status: AC
  Administered 2020-02-16: 1000 mL via INTRAVENOUS
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
## 2020-02-16 NOTE — ED Provider Notes (Signed)
[facility] Emergency Department Provider Note   ____________________________________________   Event Date/Time   First MD Initiated Contact with Patient 02/16/20 1322     (approximate)  I have reviewed the triage vital signs and the nursing notes.   HISTORY  Chief Complaint Altered Mental Status    HPI Katelyn Lane is a 65 y.o. 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  History is limited due to patient's confusion and slurred speech.  Patient has reportedly been living at the [facility] for the past 3 days, was last seen well 3 days ago.  Her nephew went to check on her today and noticed burn marks to her mouth and right fingers.  Patient admits to falling off the bed to the ground at some point, remained on the ground since then but is unable to state exactly when she fell.  She complains of diffuse pain and feeling cold.  She has a long history of cocaine abuse and was recently admitted for extended period of time, which she declined placement.  She briefly acquired dialysis during that admission.  She states she has been unable to eat or drink much for the past few days.        Past Medical History:  Diagnosis Date  . CHF (congestive heart failure) ([place])   . COVID-19   . Hyperlipidemia   . Hypertension   . Renal disorder     Patient Active Problem List   Diagnosis Date Noted  . AKI (acute kidney injury) ([place]) 02/16/2020  . Normocytic anemia 01/28/2020  . Multiple organ failure with heart failure ([place]) 01/28/2020  . Hypotension, chronic 01/28/2020  . GERD (gastroesophageal reflux disease) 01/28/2020  . Constipation 01/28/2020  . Hyponatremia 01/28/2020  . Alcoholic cirrhosis of liver with ascites ([place])   . Cardiorenal disease   . Goals of care, counseling/discussion   . Palliative care by  specialist   . Dementia ([place]) 10/29/2019  . Cocaine abuse ([place]) 10/20/2019  . History of anemia due to chronic kidney disease 06/19/2019  . 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 Accelerated hypertension   . Nonsustained ventricular tachycardia ([place])   . Chronic obstructive pulmonary disease ([place])   . Pulmonary hypertension ([place])   . Acute metabolic encephalopathy [id number]  . History of substance abuse ([place]) 05/21/2019  . COVID-19 03/06/2019  . Hypokalemia 03/06/2019  . Acute renal failure superimposed on stage 3a chronic kidney disease ([place]) 03/06/2019  . Abdominal pain 02/16/2019  . Rhabdomyolysis 02/16/2019  . Tobacco abuse 02/16/2019  . History of CVA (cerebrovascular accident) 01/18/2019  . Hypertensive urgency 12/31/2018  . Hyperlipidemia 12/31/2018  . Depression 12/31/2018    Past Surgical History:  Procedure Laterality Date  . DIALYSIS/PERMA CATHETER INSERTION N/A 10/28/2019   Procedure: DIALYSIS/PERMA CATHETER INSERTION;  Surgeon: Katha Cabal, MD;  Location: [place] [facility];  Service: Cardiovascular;  Laterality: N/A;  . DIALYSIS/PERMA CATHETER REMOVAL N/A 11/13/2019   Procedure: DIALYSIS/PERMA CATHETER REMOVAL;  Surgeon: Algernon Huxley, MD;  Location: [place] [facility];  Service: Cardiovascular;  Laterality: N/A;  . RIGHT/LEFT HEART CATH AND CORONARY ANGIOGRAPHY N/A 12/30/2018   Procedure: RIGHT/LEFT HEART CATH AND CORONARY ANGIOGRAPHY;  Surgeon: Corey Skains, MD;  Location: [employer] [facility];  Service: Cardiovascular;  Laterality: N/A;    Prior to Admission medications  Medication Sig Start Date End Date Taking? Authorizing Provider  albuterol (VENTOLIN HFA) 108 (90 Base) MCG/ACT inhaler Inhale 2 puffs into the lungs every 6 (six) hours as needed for wheezing or shortness of breath. 01/29/20   Dwyane Dee, MD  allopurinol (ZYLOPRIM) 100 MG tablet Take 1 tablet (100 mg total)  by mouth daily. 06/06/19 07/27/19  Loletha Grayer, MD  allopurinol (ZYLOPRIM) 100 MG tablet Take 1 tablet (100 mg total) by mouth daily. 01/29/20 02/28/20  Dwyane Dee, MD  apixaban (ELIQUIS) 5 MG TABS tablet Take 1 tablet (5 mg total) by mouth 2 (two) times daily. 01/29/20   Dwyane Dee, MD  ascorbic acid (VITAMIN C) 500 MG tablet Take 500 mg by mouth daily.    [provider]  atorvastatin (LIPITOR) 20 MG tablet Take 1 tablet (20 mg total) by mouth daily at 6 PM. 05/03/19 08/01/19  Charlynne Cousins, MD  atorvastatin (LIPITOR) 20 MG tablet Take 1 tablet (20 mg total) by mouth daily at 6 PM. 01/29/20 04/28/20  Dwyane Dee, MD  bumetanide (BUMEX) 2 MG tablet Take 1 tablet (2 mg total) by mouth 2 (two) times daily. 01/29/20   Dwyane Dee, MD  ciprofloxacin (CIPRO) 250 MG tablet Take 1 tablet (250 mg total) by mouth daily with breakfast. For SBP prophylaxis 01/30/20   Dwyane Dee, MD  ferrous sulfate 325 (65 FE) MG tablet Take 1 tablet (325 mg total) by mouth daily with breakfast. 09/03/19 10/03/19  [name], MD  ferrous sulfate 325 (65 FE) MG tablet Take 1 tablet (325 mg total) by mouth daily with breakfast. 01/29/20 02/28/20  Dwyane Dee, MD  FLUoxetine (PROZAC) 10 MG capsule Take 3 capsules (30 mg total) by mouth daily. 09/03/19 10/03/19  [name], MD  FLUoxetine (PROZAC) 10 MG capsule Take 3 capsules (30 mg total) by mouth daily. 01/29/20 02/28/20  Dwyane Dee, MD  hydrocortisone cream 0.5 % Apply topically 3 (three) times daily as needed for itching. 01/29/20   Dwyane Dee, MD  loratadine (CLARITIN) 10 MG tablet Take 10 mg by mouth daily as needed for allergies.     [provider]  midodrine (PROAMATINE) 10 MG tablet Take 1 tablet (10 mg total) by mouth 3 (three) times daily with meals. 01/29/20   Dwyane Dee, MD  nitroGLYCERIN (NITROSTAT) 0.4 MG SL tablet Place 1 tablet (0.4 mg total) under the tongue every 5 (five) minutes as needed for chest  pain. 05/03/19   Charlynne Cousins, MD  pantoprazole (PROTONIX) 40 MG tablet Take 1 tablet (40 mg total) by mouth 2 (two) times daily. 01/29/20   Dwyane Dee, MD  spironolactone (ALDACTONE) 25 MG tablet Take 1 tablet (25 mg total) by mouth daily. 01/30/20   Dwyane Dee, MD  thiothixene (NAVANE) 1 MG capsule Take 1 capsule (1 mg total) by mouth 2 (two) times daily. 09/02/19 10/02/19  [name], MD  thiothixene (NAVANE) 1 MG capsule Take 1 capsule (1 mg total) by mouth 2 (two) times daily. 01/29/20 02/28/20  Dwyane Dee, MD    Allergies Penicillins and Ace inhibitors  Family History  Problem Relation Age of Onset  . Breast cancer Neg Hx     Social History Social History   Tobacco Use  . Smoking status: Current Every Day Smoker  . Smokeless tobacco: Never Used  Substance Use Topics  . Alcohol use: No    Alcohol/week: 0.0 standard drinks  . Drug use: Yes    Types: Cocaine    Comment: 3-4  days ago    Review of Systems Unable to obtain secondary to altered mental status  ____________________________________________   PHYSICAL EXAM:  VITAL SIGNS: ED Triage Vitals  Enc Vitals Group     BP 02/16/20 1319 (!) 97/59     Pulse Rate 02/16/20 1319 82     Resp 02/16/20 1315 18     Temp 02/16/20 1315 (!) 96.6 F (35.9 C)     Temp Source 02/16/20 1315 Axillary     SpO2 02/16/20 1319 93 %     Weight 02/16/20 1316 175 lb (79.4 kg)     Height 02/16/20 1316 '5\' 2"'$  (1.575 m)     Head Circumference --      Peak Flow --      Pain Score 02/16/20 1315 10     Pain Loc --      Pain Edu? --      Excl. in [place]? --     Constitutional: Alert and oriented to person and place, but not time. Eyes: Conjunctivae are normal.  Pupils equal round and reactive to light bilaterally. Head: Atraumatic. Nose: No congestion/rhinnorhea. Mouth/Throat: Mucous membranes are moist dry with healing burns to upper and lower lips. Neck: Normal ROM Cardiovascular: Normal rate, regular rhythm.  Grossly normal heart sounds.  2+ radial and DP pulses bilaterally. Respiratory: Normal respiratory effort.  No retractions. Lungs CTAB. Gastrointestinal: Soft and nontender. No distention. Genitourinary: deferred Musculoskeletal: No lower extremity tenderness nor edema. Neurologic: Slurred speech noted. No gross focal neurologic deficits are appreciated. Skin:  Skin is warm, dry and intact. No rash noted. Psychiatric: Mood and affect are normal. Speech and behavior are normal.  ____________________________________________   LABS (all labs ordered are listed, but only abnormal results are displayed)  Labs Reviewed  LACTIC ACID, PLASMA - Abnormal; Notable for the following components:      Result Value   Lactic Acid, Venous 2.3 (*)    All other components within normal limits  COMPREHENSIVE METABOLIC PANEL - Abnormal; Notable for the following components:   CO2 14 (*)    BUN 159 (*)    Creatinine, Ser 8.15 (*)    Albumin 3.0 (*)    AST 191 (*)    ALT 80 (*)    Alkaline Phosphatase 158 (*)    Total Bilirubin 2.6 (*)    GFR, Estimated 5 (*)    Anion gap 22 (*)    All other components within normal limits  CBC WITH DIFFERENTIAL/PLATELET - Abnormal; Notable for the following components:   Hemoglobin 11.1 (*)    HCT 34.7 (*)    MCV 68.2 (*)    MCH 21.8 (*)    RDW 21.5 (*)    nRBC 0.9 (*)    Neutro Abs 8.6 (*)    Lymphs Abs 0.4 (*)    Abs Immature Granulocytes 0.09 (*)    All other components within normal limits  CK - Abnormal; Notable for the following components:   Total CK 1,180 (*)    All other components within normal limits  CULTURE, BLOOD (ROUTINE X 2)  CULTURE, BLOOD (ROUTINE X 2)  RESP PANEL BY RT-PCR (FLU A&B, COVID) ARPGX2  ETHANOL  LACTIC ACID, PLASMA  AMMONIA  URINALYSIS, COMPLETE (UACMP) WITH MICROSCOPIC  URINE DRUG SCREEN, QUALITATIVE ([facility] ONLY)   ____________________________________________  EKG  ED ECG REPORT I, Blake Divine, the attending  physician, personally viewed and interpreted this ECG.   Date: 02/16/2020  EKG Time: 13:19  Rate: 72  Rhythm: normal sinus  rhythm, PVC  Axis: LAD  Intervals:nonspecific intraventricular conduction delay  ST&T Change: None   PROCEDURES  Procedure(s) performed (including Critical Care):  .Critical Care Performed by: Blake Divine, MD Authorized by: Blake Divine, MD   Critical care provider statement:    Critical care time (minutes):  45   Critical care time was exclusive of:  Separately billable procedures and treating other patients and teaching time   Critical care was necessary to treat or prevent imminent or life-threatening deterioration of the following conditions:  Renal failure   Critical care was time spent personally by me on the following activities:  Discussions with consultants, evaluation of patient's response to treatment, examination of patient, ordering and performing treatments and interventions, ordering and review of laboratory studies, ordering and review of radiographic studies, pulse oximetry, re-evaluation of patient's condition, obtaining history from patient or surrogate and review of old charts   I assumed direction of critical care for this patient from another provider in my specialty: no       ____________________________________________   INITIAL IMPRESSION / ASSESSMENT AND PLAN / ED COURSE       65 year old female with past medical history of hypertension, hyperlipidemia, CKD, CHF, 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  Patient appears confused with slurred speech but no focal neurologic deficits.  CT head reviewed by me and shows no obvious hemorrhage, negative for acute process per radiology.  Chest x-ray also reviewed by me and shows no infiltrate, edema, or effusion.  EKG shows nonspecific intraventricular conduction delay, similar  to previous with no ischemic changes.  Last remarkable for acute renal failure with creatinine of greater than 8 and BUN of greater than 150.  This is likely contributing to her altered mental status, patient also with mild rhabdomyolysis given CK of greater than 1000.Marland Kitchen  She was hydrated with IV fluids and case discussed with Dr. Holley Raring of nephrology.  He recommends starting patient on bicarb drip given her acidosis.  Given her potassium remains within normal limits, no indication for emergent dialysis at this time.  Case discussed with hospitalist for admission.      ____________________________________________   FINAL CLINICAL IMPRESSION(S) / ED DIAGNOSES  Final diagnoses:  Acute renal failure, unspecified acute renal failure type (HCC)  Non-traumatic rhabdomyolysis  Altered mental status, unspecified altered mental status type     ED Discharge Orders    None       Note:  This document was prepared using Dragon voice recognition software and may include unintentional dictation errors.   Blake Divine, MD 02/16/20 1525

## 2020-02-16 NOTE — ED Notes (Signed)
Pt taken to CT via stretcher.

## 2020-02-16 NOTE — ED Notes (Signed)
Pt arrives to ER under IVC from [facility] where EMS states pt has been the past 3 days. States nephew went to check on her this morning and noticed burn marks to mouth/fingers. Pt states she was smoking cigarettes. Pt denies drug use. EMS states pt refused EMS transport, was then placed under IVC. Noted slurred speech to pt. Pt has R eye hematoma, lac lateral to R eye. Pt yelling stating that she wants pain medicine and that she is cold. Pt alert and answering questions appropriately (location, time, name) but refused to answer some triage questions.

## 2020-02-16 NOTE — Progress Notes (Addendum)
Patient to unit via ER RN with sitter for IVC. Patient placed on tele monitor, VSS, skin assessment complete w/ RN Edwina, patient screaming and moaning in pain with assessment and turns/movement. MD Agbata notified for something PRN for pain as patient does not have anything currently ordered. Nothing ordered by MD at this time. Will continue to wait for new orders and monitor patient providing reassurance and comfort as much as possible.   Unable to complete admission screening due to patient inability to communicate appropriately at this time due to pain and discomfort.

## 2020-02-16 NOTE — ED Notes (Signed)
Date and time results received: 02/16/20 1414  Test: lactic acid Critical Value: 2.3  Name of Provider Notified: Dr. Charna Archer

## 2020-02-16 NOTE — ED Notes (Signed)
Pt yelled at this RN to remove R sock. Upon this RN removing sock from pt, this RN felt something inside sock. This RN asked what pt had in sock and pt replied "drugs". This Rn asked if there was anything else in the sock and she said "money". Colletta Maryland RN called to bedside and counted a $20 bill. Money given back to pt. Marijuana removed from sock and placed in stericycle, witnessed by [employer]. Paper that marijuana was in placed in sharps container next to stericyle container.

## 2020-02-17 DIAGNOSIS — M25462 Effusion, left knee: Secondary | ICD-10-CM

## 2020-02-17 DIAGNOSIS — R7881 Bacteremia: Secondary | ICD-10-CM

## 2020-02-17 DIAGNOSIS — B957 Other staphylococcus as the cause of diseases classified elsewhere: Secondary | ICD-10-CM

## 2020-02-17 DIAGNOSIS — N179 Acute kidney failure, unspecified: Secondary | ICD-10-CM | POA: Diagnosis not present

## 2020-02-17 DIAGNOSIS — B954 Other streptococcus as the cause of diseases classified elsewhere: Secondary | ICD-10-CM | POA: Diagnosis not present

## 2020-02-17 DIAGNOSIS — G9340 Encephalopathy, unspecified: Secondary | ICD-10-CM

## 2020-02-17 DIAGNOSIS — J9811 Atelectasis: Secondary | ICD-10-CM

## 2020-02-17 DIAGNOSIS — K7460 Unspecified cirrhosis of liver: Secondary | ICD-10-CM

## 2020-02-17 DIAGNOSIS — D649 Anemia, unspecified: Secondary | ICD-10-CM

## 2020-02-17 DIAGNOSIS — D696 Thrombocytopenia, unspecified: Secondary | ICD-10-CM

## 2020-02-17 DIAGNOSIS — W19XXXA Unspecified fall, initial encounter: Secondary | ICD-10-CM

## 2020-02-17 LAB — CBC
HCT: 30.4 % — ABNORMAL LOW (ref 36.0–46.0)
Hemoglobin: 9.9 g/dL — ABNORMAL LOW (ref 12.0–15.0)
MCH: 21.9 pg — ABNORMAL LOW (ref 26.0–34.0)
MCHC: 32.6 g/dL (ref 30.0–36.0)
MCV: 67.3 fL — ABNORMAL LOW (ref 80.0–100.0)
Platelets: 137 10*3/uL — ABNORMAL LOW (ref 150–400)
RBC: 4.52 MIL/uL (ref 3.87–5.11)
RDW: 21.2 % — ABNORMAL HIGH (ref 11.5–15.5)
WBC: 8.4 10*3/uL (ref 4.0–10.5)
nRBC: 0.6 % — ABNORMAL HIGH (ref 0.0–0.2)

## 2020-02-17 LAB — PROCALCITONIN: Procalcitonin: 22.18 ng/mL

## 2020-02-17 LAB — BLOOD CULTURE ID PANEL (REFLEXED) - BCID2

## 2020-02-17 LAB — BASIC METABOLIC PANEL
Anion gap: 19 — ABNORMAL HIGH (ref 5–15)
BUN: 147 mg/dL — ABNORMAL HIGH (ref 8–23)
CO2: 19 mmol/L — ABNORMAL LOW (ref 22–32)
Calcium: 8.8 mg/dL — ABNORMAL LOW (ref 8.9–10.3)
Chloride: 102 mmol/L (ref 98–111)
Creatinine, Ser: 8.13 mg/dL — ABNORMAL HIGH (ref 0.44–1.00)
GFR, Estimated: 5 mL/min — ABNORMAL LOW (ref >60)
Glucose, Bld: 103 mg/dL — ABNORMAL HIGH (ref 70–99)
Potassium: 4.2 mmol/L (ref 3.5–5.1)
Sodium: 140 mmol/L (ref 135–145)

## 2020-02-17 LAB — MAGNESIUM: Magnesium: 2.3 mg/dL (ref 1.7–2.4)

## 2020-02-17 LAB — HIV ANTIBODY (ROUTINE TESTING W REFLEX): HIV Screen 4th Generation wRfx: NONREACTIVE

## 2020-02-17 LAB — CK: Total CK: 609 U/L — ABNORMAL HIGH (ref 38–234)

## 2020-02-17 MED ORDER — BLISTEX MEDICATED EX OINT
TOPICAL_OINTMENT | CUTANEOUS | Status: DC | PRN
  Filled 2020-02-17: qty 6.3

## 2020-02-17 NOTE — Progress Notes (Signed)
This RN notified from MD Reesa Chew that urine analysis not sent to lab requested Tuesday. This RN was unaware of urinalysis not sent to lab. Patient has voided on bedside commode with sitter today. Last void measured at 224m. [facility] as well as specimen container placed in room for collection to be sent to lab. Sitter made aware specimen needs to be collected and very helpful in assisting with patient's needs. Sitter, Alisa, to notify me when patient voids next.

## 2020-02-17 NOTE — TOC Initial Note (Signed)
Transition of Care [facility]) - Initial/Assessment Note    Patient Details  Name: Katelyn Lane MRN: [id number] Date of Birth: 1955-06-02  [facility] of [facility]) CM/SW Contact:    Beverly Sessions, RN Phone Number: 02/17/2020, 10:17 AM  Clinical Narrative:                    Patient with extreme risk for readmission score.  TOC attempted to complete assessment.  Patient lethargic and will briefly open her eyes  Noted that patient came in from [facility], is under IVC and has sitter at bedside      Patient Goals and CMS Choice        Expected Discharge Plan and Services                                                Prior Living Arrangements/Services                       Activities of Daily Living      Permission Sought/Granted                  Emotional Assessment              Admission diagnosis:  AKI (acute kidney injury) ([place]) [N17.9] Non-traumatic rhabdomyolysis [M62.82] Acute renal failure, unspecified acute renal failure type ([place]) [N17.9] Altered mental status, unspecified altered mental status type [R41.82] Patient Active Problem List   Diagnosis Date Noted  . AKI (acute kidney injury) ([place]) 02/16/2020  . Transaminitis 02/16/2020  . Chronic systolic CHF (congestive heart failure) ([place]) 02/16/2020  . Normocytic anemia 01/28/2020  . Multiple organ failure with heart failure ([place]) 01/28/2020  . Hypotension, chronic 01/28/2020  . GERD (gastroesophageal reflux disease) 01/28/2020  . Constipation 01/28/2020  . Hyponatremia 01/28/2020  . Alcoholic cirrhosis of liver with ascites ([place])   . Cardiorenal disease   . Goals of care, counseling/discussion   . Palliative care by specialist   . Dementia ([place]) 10/29/2019  . Cocaine abuse ([place]) 10/20/2019  . History of anemia due to chronic kidney disease 06/19/2019  . Acute on chronic combined systolic (congestive) and diastolic (congestive) heart failure ([place]) 06/03/2019  .  Accelerated hypertension   . Nonsustained ventricular tachycardia ([place])   . Chronic obstructive pulmonary disease ([place])   . Pulmonary hypertension ([place])   . Acute metabolic encephalopathy Q000111Q  . History of substance abuse ([place]) 05/21/2019  . COVID-19 03/06/2019  . Hypokalemia 03/06/2019  . Acute renal failure superimposed on stage 3a chronic kidney disease ([place]) 03/06/2019  . Abdominal pain 02/16/2019  . Rhabdomyolysis 02/16/2019  . Tobacco abuse 02/16/2019  . History of CVA (cerebrovascular accident) 01/18/2019  . Hypertensive urgency 12/31/2018  . Hyperlipidemia 12/31/2018  . Depression 12/31/2018   PCP:  [name], MD Pharmacy:   [place], [place] - [address] [address] Phone: [phone] Fax: [phone]     Social Determinants of Health (SDOH) Interventions    Readmission Risk Interventions Readmission Risk Prevention Plan 09/02/2019 08/14/2019 07/27/2019  Transportation Screening Complete Complete Complete  Medication Review (RN Care Manager) Complete Referral to Pharmacy -  PCP or Specialist appointment within 3-5 days of discharge Complete Complete Complete  HRI or Home Care Consult - Complete Complete  SW Recovery Care/Counseling Consult - Complete  Complete  Palliative Care Screening Not Applicable Not Applicable Not Applicable  Skilled Nursing Facility Complete Not Applicable Not Applicable  Some recent data might be hidden
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
## 2020-02-17 NOTE — Consult Note (Signed)
NAME: Katelyn Lane  DOB: 12-26-1955  MRN: [id number]  Date/Time: 02/17/2020 10:37 PM  REQUESTING PROVIDER: Dr.Amin Subjective:  REASON FOR CONSULT: bacteremia ?No history available from patient as she is confused.  Chart reviewed. Katelyn Lane is a 65 y.o. female  with a history of CHF with EF of 15-30%, alcohol use, cocaine use, hypertension, CKD, cirrhosis liver with ascites presented to the ED after being found on the floor in a hotel. Patient was found in [place] on the floor by law enforcement and EMS was then called.  There was evidence of her doing drugs.  She had swollen and painful feet and also her fingertips were swollen as well as the lips.  EMS brought her to the ED. In the ED vitals were 110/73, 97.5, heart rate 71, respiratory 20 and sats 99%.  WBC was 10.2, Hb 11.1, platelet 158 and creatinine 8.15.  Her previous creatinine was 3.88.  Patient was confused on presentation and could not give a good history.  Blood cultures were sent and I am seeing the patient because she has Streptococcus bacteremia.   She recently had a prolonged hospital stay between 10/19/2019 until 01/29/2020 for exacerbation of acute on chronic combined systolic and diastolic congestive heart failure, also had dark emesis and the anticoagulation she was taking was stopped.  Gastroenterology was consulted.  There was a question of hepatorenal syndrome with gastric bleeding.  Underwent temporary dialysis on 10/17 and 10/26/2019 for anasarca. she had abdominal pain and distention and a CT scan done on 10/27/2019 revealed slight irregularity of the liver contour representing early cirrhosis, air within the wall of the gastric fundus consistent with pneumatosis most concerning for bowel ischemia, 2 small pockets of air in the periportal region and along the falciform ligament most concerning for extraluminal possibly intravascular air,Mildly dilated small bowel loops measuring to 4 cm in diameter, anasarca of the abdominal  wall.  Seen by surgeon who did not think surgical intervention was needed.  Broad-spectrum antibiotics were given.  Paracentesis done showed evidence of SBP on 10/26/2019 with total nucleated WBC of 5644 with 98% neutrophils.  LDH was 277, lipase was 1058 and amylase was 630 and albumin was 1.6 t the serum lipase was 79. Patient did not undergo endoscopy.  She was seen by GI and they asked that she be put on secondary prophylaxis for bacterial peritonitis indefinitely. Patient stayed in the hospital for 3 months because of issue of placement and he finally when they found a placement for her she did not want to go to the rehab or SNF and as she was fully capable of making her decisions she was discharged.  Past Medical History:  Diagnosis Date  . CHF (congestive heart failure) ([place])   . COVID-19   . Hyperlipidemia   . Hypertension   . Renal disorder     Past Surgical History:  Procedure Laterality Date  . DIALYSIS/PERMA CATHETER INSERTION N/A 10/28/2019   Procedure: DIALYSIS/PERMA CATHETER INSERTION;  Surgeon: Katha Cabal, MD;  Location: [facility];  Service: Cardiovascular;  Laterality: N/A;  . DIALYSIS/PERMA CATHETER REMOVAL N/A 11/13/2019   Procedure: DIALYSIS/PERMA CATHETER REMOVAL;  Surgeon: Algernon Huxley, MD;  Location: [facility];  Service: Cardiovascular;  Laterality: N/A;  . RIGHT/LEFT HEART CATH AND CORONARY ANGIOGRAPHY N/A 12/30/2018   Procedure: RIGHT/LEFT HEART CATH AND CORONARY ANGIOGRAPHY;  Surgeon: Corey Skains, MD;  Location: [facility];  Service: Cardiovascular;  Laterality: N/A;    Social History  Socioeconomic History  . Marital status: Single    Spouse name: Not on file  . Number of children: Not on file  . Years of education: Not on file  . Highest education level: Not on file  Occupational History  . Not on file  Tobacco Use  . Smoking status: Current Every Day Smoker  . Smokeless tobacco: Never Used  Substance and  Sexual Activity  . Alcohol use: No    Alcohol/week: 0.0 standard drinks  . Drug use: Yes    Types: Cocaine    Comment: 3-4 days ago  . Sexual activity: Not on file  Other Topics Concern  . Not on file  Social History Narrative  . Not on file   Social Determinants of Health   Financial Resource Strain: Not on file  Food Insecurity: Not on file  Transportation Needs: Not on file  Physical Activity: Not on file  Stress: Not on file  Social Connections: Not on file  Intimate Partner Violence: Not on file    Family History  Problem Relation Age of Onset  . Breast cancer Neg Hx    Allergies  Allergen Reactions  . Penicillins Hives, Rash and Other (See Comments)    Did it involve swelling of the face/tongue/throat, SOB, or low BP? No Did it involve sudden or severe rash/hives, skin peeling, or any reaction on the inside of your mouth or nose? Yes Did you need to seek medical attention at a hospital or doctor's office? Yes When did it last happen? >25 years If all above answers are "NO", may proceed with cephalosporin use.   . Ace Inhibitors Nausea And Vomiting    ? Current Facility-Administered Medications  Medication Dose Route Frequency Provider Last Rate Last Admin  . 0.9 %  sodium chloride infusion  250 mL Intravenous PRN Agbata, Tochukwu, MD      . albuterol (PROVENTIL) (2.5 MG/3ML) 0.083% nebulizer solution 3 mL  3 mL Inhalation Q6H PRN Agbata, Tochukwu, MD      . allopurinol (ZYLOPRIM) tablet 100 mg  100 mg Oral Daily Agbata, Tochukwu, MD   100 mg at 02/17/20 0915  . apixaban (ELIQUIS) tablet 5 mg  5 mg Oral BID Agbata, Tochukwu, MD   5 mg at 02/17/20 2150  . ascorbic acid (VITAMIN C) tablet 500 mg  500 mg Oral Daily Agbata, Tochukwu, MD      . cefTRIAXone (ROCEPHIN) 2 g in sodium chloride 0.9 % 100 mL IVPB  2 g Intravenous Q24H Agbata, Tochukwu, MD 200 mL/hr at 02/17/20 1714 2 g at 02/17/20 1714  . ferrous sulfate tablet 325 mg  325 mg Oral Q breakfast Agbata,  Tochukwu, MD      . FLUoxetine (PROZAC) capsule 30 mg  30 mg Oral Daily Agbata, Tochukwu, MD   30 mg at 02/17/20 0920  . lip balm (BLISTEX) ointment   Topical PRN Lorella Nimrod, MD   Given at 02/17/20 1717  . loratadine (CLARITIN) tablet 10 mg  10 mg Oral Daily PRN Agbata, Tochukwu, MD      . midodrine (PROAMATINE) tablet 10 mg  10 mg Oral TID WC Agbata, Tochukwu, MD   10 mg at 02/17/20 1708  . nitroGLYCERIN (NITROSTAT) SL tablet 0.4 mg  0.4 mg Sublingual Q5 min PRN Agbata, Tochukwu, MD      . ondansetron (ZOFRAN) tablet 4 mg  4 mg Oral Q6H PRN Agbata, Tochukwu, MD       Or  . ondansetron (ZOFRAN) injection 4 mg  4  mg Intravenous Q6H PRN Agbata, Tochukwu, MD      . pantoprazole (PROTONIX) EC tablet 40 mg  40 mg Oral BID Agbata, Tochukwu, MD   40 mg at 02/17/20 2150  . sodium bicarbonate 150 mEq in dextrose 5 % 1,000 mL infusion   Intravenous Continuous Blake Divine, MD 100 mL/hr at 02/17/20 1937 New Bag at 02/17/20 1937  . sodium chloride flush (NS) 0.9 % injection 3 mL  3 mL Intravenous Q12H Agbata, Tochukwu, MD      . sodium chloride flush (NS) 0.9 % injection 3 mL  3 mL Intravenous PRN Agbata, Tochukwu, MD      . thiothixene (NAVANE) capsule 1 mg  1 mg Oral BID Agbata, Tochukwu, MD   1 mg at 02/17/20 2147     Abtx:  Anti-infectives (From admission, onward)   Start     Dose/Rate Route Frequency Ordered Stop   02/16/20 1800  cefTRIAXone (ROCEPHIN) 2 g in sodium chloride 0.9 % 100 mL IVPB        2 g 200 mL/hr over 30 Minutes Intravenous Every 24 hours 02/16/20 1730     02/16/20 1800  metroNIDAZOLE (FLAGYL) IVPB 500 mg  Status:  Discontinued        500 mg 100 mL/hr over 60 Minutes Intravenous Every 8 hours 02/16/20 1730 02/17/20 0951      REVIEW OF SYSTEMS:  Not available  Objective:  VITALS:  BP 93/75 (BP Location: Right Arm)   Pulse 61   Temp (!) 96.9 F (36.1 C) (Axillary)   Resp 20   Ht '5\' 2"'$  (1.575 m)   Wt 76.3 kg   SpO2 97%   BMI 30.77 kg/m  PHYSICAL EXAM:  General:  Confused, stuttering speech, has myoclonic jerks on the arms, follows simple commands Head: Normocephalic, without obvious abnormality, atraumatic. Eyes: Conjunctivae clear, anicteric sclerae. Pupils are equal ENT Nares normal. No drainage or sinus tenderness. Lips, mucosa, and tongue normal. No Thrush Poor dentition Neck: Supple, symmetrical, no adenopathy, thyroid: non tender no carotid bruit and no JVD. Back: No CVA tenderness. Lungs: Bilateral air entry Heart: S1-S2 abdomen: Soft, non-tender,not distended. Bowel sounds normal. No masses Extremities: Tender feet on palpation especially over the first metatarsals of both feet. Has flushed erythematous appearing plantar aspect of the feet Left knee swollen and painful Has ecchymosis and hematoma over the right arm and right thigh. Small abrasion on the right side of the forehead  Lymph: Cervical, supraclavicular normal. Neurologic: Cannot assess in great detail Grossly non-focal Pertinent Labs Lab Results CBC    Component Value Date/Time   WBC 8.4 02/17/2020 0451   RBC 4.52 02/17/2020 0451   HGB 9.9 (L) 02/17/2020 0451   HGB 14.2 06/08/2012 1847   HCT 30.4 (L) 02/17/2020 0451   HCT 43.4 06/08/2012 1847   PLT 137 (L) 02/17/2020 0451   PLT 337 06/08/2012 1847   MCV 67.3 (L) 02/17/2020 0451   MCV 83 06/08/2012 1847   MCH 21.9 (L) 02/17/2020 0451   MCHC 32.6 02/17/2020 0451   RDW 21.2 (H) 02/17/2020 0451   RDW 14.8 (H) 06/08/2012 1847   LYMPHSABS 0.4 (L) 02/16/2020 1326   MONOABS 1.0 02/16/2020 1326   EOSABS 0.0 02/16/2020 1326   BASOSABS 0.0 02/16/2020 1326    CMP Latest Ref Rng & Units 02/17/2020 02/16/2020 01/29/2020  Glucose 70 - 99 mg/dL 103(H) 75 103(H)  BUN 8 - 23 mg/dL 147(H) 159(H) 73(H)  Creatinine 0.44 - 1.00 mg/dL 8.13(H) 8.15(H) 3.88(H)  Sodium 135 -  145 mmol/L 140 140 136  Potassium 3.5 - 5.1 mmol/L 4.2 4.8 4.2  Chloride 98 - 111 mmol/L 102 104 101  CO2 22 - 32 mmol/L 19(L) 14(L) 21(L)  Calcium 8.9 - 10.3  mg/dL 8.8(L) 9.4 10.1  Total Protein 6.5 - 8.1 g/dL - 7.3 -  Total Bilirubin 0.3 - 1.2 mg/dL - 2.6(H) -  Alkaline Phos 38 - 126 U/L - 158(H) -  AST 15 - 41 U/L - 191(H) -  ALT 0 - 44 U/L - 80(H) -      Microbiology: Recent Results (from the past 240 hour(s))  Culture, blood (routine x 2)     Status: None (Preliminary result)   Collection Time: 02/16/20  2:28 PM   Specimen: BLOOD  Result Value Ref Range Status   Specimen Description BLOOD BLOOD LEFT HAND  Final   Special Requests   Final    BOTTLES DRAWN AEROBIC AND ANAEROBIC Blood Culture adequate volume   Culture  Setup Time   Final    GRAM POSITIVE COCCI AEROBIC BOTTLE ONLY Organism ID to follow CRITICAL RESULT CALLED TO, READ BACK BY AND VERIFIED WITHLloyd Huger PHARMD [id number] 02/17/20 HNM Performed at [facility], [address] [place], [place] [address]    Culture GRAM POSITIVE COCCI  Final   Report Status PENDING  Incomplete  Blood Culture ID Panel (Reflexed)     Status: Abnormal   Collection Time: 02/16/20  2:28 PM  Result Value Ref Range Status   Enterococcus faecalis NOT DETECTED NOT DETECTED Final   Enterococcus Faecium NOT DETECTED NOT DETECTED Final   Listeria monocytogenes NOT DETECTED NOT DETECTED Final   Staphylococcus species DETECTED (A) NOT DETECTED Final    Comment: CRITICAL RESULT CALLED TO, READ BACK BY AND VERIFIED WITHLloyd Huger PHARMD [id number] 02/17/20 HNM    Staphylococcus aureus (BCID) NOT DETECTED NOT DETECTED Final   Staphylococcus epidermidis DETECTED (A) NOT DETECTED Final    Comment: Methicillin (oxacillin) resistant coagulase negative staphylococcus. Possible blood culture contaminant (unless isolated from more than one blood culture draw or clinical case suggests pathogenicity). No antibiotic treatment is indicated for blood  culture contaminants. CRITICAL RESULT CALLED TO, READ BACK BY AND VERIFIED WITH: Lloyd Huger PHARMD [id number] 02/17/20 HNM    Staphylococcus lugdunensis NOT  DETECTED NOT DETECTED Final   Streptococcus species DETECTED (A) NOT DETECTED Final    Comment: Not Enterococcus species, Streptococcus agalactiae, Streptococcus pyogenes, or Streptococcus pneumoniae. CRITICAL RESULT CALLED TO, READ BACK BY AND VERIFIED WITH: NATHAN BELUE PHARMD [id number] 02/17/20 HNM    Streptococcus agalactiae NOT DETECTED NOT DETECTED Final   Streptococcus pneumoniae NOT DETECTED NOT DETECTED Final   Streptococcus pyogenes NOT DETECTED NOT DETECTED Final   A.calcoaceticus-baumannii NOT DETECTED NOT DETECTED Final   Bacteroides fragilis NOT DETECTED NOT DETECTED Final   Enterobacterales NOT DETECTED NOT DETECTED Final   Enterobacter cloacae complex NOT DETECTED NOT DETECTED Final   Escherichia coli NOT DETECTED NOT DETECTED Final   Klebsiella aerogenes NOT DETECTED NOT DETECTED Final   Klebsiella oxytoca NOT DETECTED NOT DETECTED Final   Klebsiella pneumoniae NOT DETECTED NOT DETECTED Final   Proteus species NOT DETECTED NOT DETECTED Final   Salmonella species NOT DETECTED NOT DETECTED Final   Serratia marcescens NOT DETECTED NOT DETECTED Final   Haemophilus influenzae NOT DETECTED NOT DETECTED Final   Neisseria meningitidis NOT DETECTED NOT DETECTED Final   Pseudomonas aeruginosa NOT DETECTED NOT DETECTED Final   Stenotrophomonas maltophilia NOT DETECTED NOT DETECTED Final  Candida albicans NOT DETECTED NOT DETECTED Final   Candida auris NOT DETECTED NOT DETECTED Final   Candida glabrata NOT DETECTED NOT DETECTED Final   Candida krusei NOT DETECTED NOT DETECTED Final   Candida parapsilosis NOT DETECTED NOT DETECTED Final   Candida tropicalis NOT DETECTED NOT DETECTED Final   Cryptococcus neoformans/gattii NOT DETECTED NOT DETECTED Final   Methicillin resistance mecA/C DETECTED (A) NOT DETECTED Final    Comment: CRITICAL RESULT CALLED TO, READ BACK BY AND VERIFIED WITHLloyd Huger PHARMD [id number] 02/17/20 HNM Performed at [facility], [address]  [place], [place] [address]   Culture, blood (routine x 2)     Status: None (Preliminary result)   Collection Time: 02/16/20  2:38 PM   Specimen: BLOOD  Result Value Ref Range Status   Specimen Description BLOOD RIGHT ANTECUBITAL  Final   Special Requests   Final    BOTTLES DRAWN AEROBIC AND ANAEROBIC Blood Culture adequate volume   Culture   Final    NO GROWTH < 24 HOURS Performed at [facility], [address] [place],  [address]    Report Status PENDING  Incomplete  Resp Panel by RT-PCR (Flu A&B, Covid) Nasopharyngeal Swab     Status: None   Collection Time: 02/16/20  2:52 PM   Specimen: Nasopharyngeal Swab; Nasopharyngeal(NP) swabs in vial transport medium  Result Value Ref Range Status   SARS Coronavirus 2 by RT PCR NEGATIVE NEGATIVE Final    Comment: (NOTE) SARS-CoV-2 target nucleic acids are NOT DETECTED.  The SARS-CoV-2 RNA is generally detectable in upper respiratory specimens during the acute phase of infection. The lowest concentration of SARS-CoV-2 viral copies this assay can detect is 138 copies/mL. A negative result does not preclude SARS-Cov-2 infection and should not be used as the sole basis for treatment or other patient management decisions. A negative result may occur with  improper specimen collection/handling, submission of specimen other than nasopharyngeal swab, presence of viral mutation(s) within the areas targeted by this assay, and inadequate number of viral copies(<138 copies/mL). A negative result must be combined with clinical observations, patient history, and epidemiological information. The expected result is Negative.  Fact Sheet for Patients:  EntrepreneurPulse.com.au  Fact Sheet for Healthcare Providers:  IncredibleEmployment.be  This test is no t yet approved or cleared by the [place] FDA and  has been authorized for detection and/or diagnosis of SARS-CoV-2 by FDA under an Emergency  Use Authorization (EUA). This EUA will remain  in effect (meaning this test can be used) for the duration of the COVID-19 declaration under Section 564(b)(1) of the Act, 21 U.S.C.section 360bbb-3(b)(1), unless the authorization is terminated  or revoked sooner.       Influenza A by PCR NEGATIVE NEGATIVE Final   Influenza B by PCR NEGATIVE NEGATIVE Final    Comment: (NOTE) The Xpert Xpress SARS-CoV-2/FLU/RSV plus assay is intended as an aid in the diagnosis of influenza from Nasopharyngeal swab specimens and should not be used as a sole basis for treatment. Nasal washings and aspirates are unacceptable for Xpert Xpress SARS-CoV-2/FLU/RSV testing.  Fact Sheet for Patients: EntrepreneurPulse.com.au  Fact Sheet for Healthcare Providers: IncredibleEmployment.be  This test is not yet approved or cleared by the [place] FDA and has been authorized for detection and/or diagnosis of SARS-CoV-2 by FDA under an Emergency Use Authorization (EUA). This EUA will remain in effect (meaning this test can be used) for the duration of the COVID-19 declaration under Section 564(b)(1) of the Act, 21  U.S.C. section 360bbb-3(b)(1), unless the authorization is terminated or revoked.  Performed at [facility], [address] [place], [place] [address]     IMAGING RESULTS: CT head no acute intracranial abnormality. CT cervical spine no acute fracture   I have personally reviewed the films ?Cardiomegaly.  Small left upper lung atelectasis/consolidation.  Impression/Recommendation ?65 year old female with history of chronic congestive heart failure, polysubstance use including alcohol and cocaine, CKD, cirrhosis of the liver with ascites, hypertension presented with altered mental status and being found on the floor.  Acute encephalopathy likely due to uremia.  Possible element of substance use Urine tox screen has not been sent  AKI on CKD  creatinine was 8.15 on presentation to baseline was 3.88. She has myoclonic jerks.  She may need dialysis just because of her encephalopathy and myoclonic jerks.  Streptococcus mitis  in 1 of 4 bottles.  Also has staph epidermidis in the same bottle.  Likely contaminant.  Currently on ceftriaxone.  We will get a 2D echo to assess her EF we will also repeat blood culture..  ? ? Tender feet especially the great toes.  Rule out gout  Left knee effusion and tenderness.  Would recommend arthrocentesis to send the fluid for cell count, crystals and culture.  Rhabdomyolysis secondary to fall  Procalcitonin is high because of kidney failure AST 121 secondary to rhabdomyolysis and also with cirrhosis.  Anemia  Thrombocytopenia   Cirrhosis of the liver.  On examination does not have much of ascites.  Polysubstance use needs urine drug screen ___________________________________________________ Discussed the management with the care team and the hospitalist. Note:  This document was prepared using Dragon voice recognition software and may include unintentional dictation errors.

## 2020-02-17 NOTE — Progress Notes (Addendum)
PHARMACY - PHYSICIAN COMMUNICATION CRITICAL VALUE ALERT - BLOOD CULTURE IDENTIFICATION (BCID)  Katelyn Lane is an 65 y.o. female who presented to [facility] on 02/16/2020 with a chief complaint of altered mental status.  Assessment: Blood cultures from 2/8 with GPC in 1 of 4 bottles, BCID detected BOTH streptococcus species and MRSE  Name of physician (or Provider) Contacted: Amin  Current antibiotics: ceftriaxone 2g q24h, metronidazole '500mg'$  q8h  Changes to prescribed antibiotics recommended:  Continue ceftriaxone 2g q24h and discontinue metronidazole Recommendations accepted by provider  Repeat blood cultures ordered  Results for orders placed or performed during the hospital encounter of 02/16/20  Blood Culture ID Panel (Reflexed) (Collected: 02/16/2020  2:28 PM)  Result Value Ref Range   Enterococcus faecalis NOT DETECTED NOT DETECTED   Enterococcus Faecium NOT DETECTED NOT DETECTED   Listeria monocytogenes NOT DETECTED NOT DETECTED   Staphylococcus species DETECTED (A) NOT DETECTED   Staphylococcus aureus (BCID) NOT DETECTED NOT DETECTED   Staphylococcus epidermidis DETECTED (A) NOT DETECTED   Staphylococcus lugdunensis NOT DETECTED NOT DETECTED   Streptococcus species DETECTED (A) NOT DETECTED   Streptococcus agalactiae NOT DETECTED NOT DETECTED   Streptococcus pneumoniae NOT DETECTED NOT DETECTED   Streptococcus pyogenes NOT DETECTED NOT DETECTED   A.calcoaceticus-baumannii NOT DETECTED NOT DETECTED   Bacteroides fragilis NOT DETECTED NOT DETECTED   Enterobacterales NOT DETECTED NOT DETECTED   Enterobacter cloacae complex NOT DETECTED NOT DETECTED   Escherichia coli NOT DETECTED NOT DETECTED   Klebsiella aerogenes NOT DETECTED NOT DETECTED   Klebsiella oxytoca NOT DETECTED NOT DETECTED   Klebsiella pneumoniae NOT DETECTED NOT DETECTED   Proteus species NOT DETECTED NOT DETECTED   Salmonella species NOT DETECTED NOT DETECTED   Serratia marcescens NOT DETECTED NOT DETECTED    Haemophilus influenzae NOT DETECTED NOT DETECTED   Neisseria meningitidis NOT DETECTED NOT DETECTED   Pseudomonas aeruginosa NOT DETECTED NOT DETECTED   Stenotrophomonas maltophilia NOT DETECTED NOT DETECTED   Candida albicans NOT DETECTED NOT DETECTED   Candida auris NOT DETECTED NOT DETECTED   Candida glabrata NOT DETECTED NOT DETECTED   Candida krusei NOT DETECTED NOT DETECTED   Candida parapsilosis NOT DETECTED NOT DETECTED   Candida tropicalis NOT DETECTED NOT DETECTED   Cryptococcus neoformans/gattii NOT DETECTED NOT DETECTED   Methicillin resistance mecA/C DETECTED (A) NOT DETECTED    Benn Moulder 02/17/2020  9:52 AM

## 2020-02-17 NOTE — Progress Notes (Signed)
Patient had 8 beat run of v-tach. Patient asymptomatic at time of the event. Obtaining morning labs now. Sharion Settler aware of the 8 beat run. Mag added to morning labs. Unable to give patient pain medicine for her feet at this time because of polysubstance used and elevated liver enzymes. Patient does not complain of pain except for when feet are touched. Patient remains NPO, oral care done. Lemon swabs provided to help with dry mouth. Patient pulled out her one IV. Bed sheets changed and IV med moved to L hand IV. Will continue with the current plan of care. Sitter remains in room for safety.

## 2020-02-18 ENCOUNTER — Inpatient Hospital Stay: Payer: Medicaid Other

## 2020-02-18 LAB — COMPREHENSIVE METABOLIC PANEL
ALT: 61 U/L — ABNORMAL HIGH (ref 0–44)
AST: 121 U/L — ABNORMAL HIGH (ref 15–41)
Albumin: 2.6 g/dL — ABNORMAL LOW (ref 3.5–5.0)
Alkaline Phosphatase: 131 U/L — ABNORMAL HIGH (ref 38–126)
Anion gap: 20 — ABNORMAL HIGH (ref 5–15)
BUN: 141 mg/dL — ABNORMAL HIGH (ref 8–23)
CO2: 22 mmol/L (ref 22–32)
Calcium: 9.1 mg/dL (ref 8.9–10.3)
Chloride: 97 mmol/L — ABNORMAL LOW (ref 98–111)
Creatinine, Ser: 7.35 mg/dL — ABNORMAL HIGH (ref 0.44–1.00)
GFR, Estimated: 6 mL/min — ABNORMAL LOW (ref >60)
Glucose, Bld: 108 mg/dL — ABNORMAL HIGH (ref 70–99)
Potassium: 3.7 mmol/L (ref 3.5–5.1)
Sodium: 139 mmol/L (ref 135–145)
Total Bilirubin: 2.5 mg/dL — ABNORMAL HIGH (ref 0.3–1.2)
Total Protein: 6.6 g/dL (ref 6.5–8.1)

## 2020-02-18 LAB — URINE DRUG SCREEN, QUALITATIVE (ARMC ONLY)
Amphetamines, Ur Screen: NOT DETECTED
Barbiturates, Ur Screen: NOT DETECTED
Benzodiazepine, Ur Scrn: NOT DETECTED
Cannabinoid 50 Ng, Ur ~~LOC~~: NOT DETECTED
Cocaine Metabolite,Ur ~~LOC~~: POSITIVE — AB
MDMA (Ecstasy)Ur Screen: NOT DETECTED
Methadone Scn, Ur: NOT DETECTED
Opiate, Ur Screen: NOT DETECTED
Phencyclidine (PCP) Ur S: NOT DETECTED
Tricyclic, Ur Screen: NOT DETECTED

## 2020-02-18 LAB — URINALYSIS, COMPLETE (UACMP) WITH MICROSCOPIC
Bilirubin Urine: NEGATIVE
Glucose, UA: NEGATIVE mg/dL
Ketones, ur: NEGATIVE mg/dL
Nitrite: NEGATIVE
Protein, ur: 30 mg/dL — AB
Specific Gravity, Urine: 1.013 (ref 1.005–1.030)
pH: 5 (ref 5.0–8.0)

## 2020-02-18 LAB — CBC
HCT: 32.4 % — ABNORMAL LOW (ref 36.0–46.0)
Hemoglobin: 10.6 g/dL — ABNORMAL LOW (ref 12.0–15.0)
MCH: 21.9 pg — ABNORMAL LOW (ref 26.0–34.0)
MCHC: 32.7 g/dL (ref 30.0–36.0)
MCV: 67.1 fL — ABNORMAL LOW (ref 80.0–100.0)
Platelets: 124 10*3/uL — ABNORMAL LOW (ref 150–400)
RBC: 4.83 MIL/uL (ref 3.87–5.11)
RDW: 21.3 % — ABNORMAL HIGH (ref 11.5–15.5)
WBC: 6.3 10*3/uL (ref 4.0–10.5)
nRBC: 1.4 % — ABNORMAL HIGH (ref 0.0–0.2)

## 2020-02-18 LAB — C DIFFICILE QUICK SCREEN W PCR REFLEX
C Diff antigen: NEGATIVE
C Diff interpretation: NOT DETECTED
C Diff toxin: NEGATIVE

## 2020-02-18 LAB — CK: Total CK: 447 U/L — ABNORMAL HIGH (ref 38–234)

## 2020-02-18 MED ORDER — APIXABAN 2.5 MG PO TABS
2.5000 mg | ORAL_TABLET | Freq: Two times a day (BID) | ORAL | Status: DC
  Administered 2020-02-18 – 2020-02-23 (×11): 2.5 mg via ORAL
  Filled 2020-02-18 (×11): qty 1
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
## 2020-02-18 NOTE — Progress Notes (Signed)
Night shift RN reported urine sample unable to be obtained. Each urine occurrence also had stool mixed in. Will attempt to collect urine sample today.

## 2020-02-18 NOTE — Progress Notes (Signed)
Unable to get urine sample tonight. Stool keeps getting mixed in with the urine.

## 2020-02-19 ENCOUNTER — Inpatient Hospital Stay: Payer: Medicaid Other

## 2020-02-19 ENCOUNTER — Inpatient Hospital Stay
Admit: 2020-02-19 | Discharge: 2020-02-19 | Disposition: A | Discharge status: Home / Self Care | Payer: Medicaid Other | Attending: Infectious Diseases | Admitting: Infectious Diseases

## 2020-02-19 DIAGNOSIS — R4182 Altered mental status, unspecified: Secondary | ICD-10-CM | POA: Diagnosis not present

## 2020-02-19 DIAGNOSIS — R7401 Elevation of levels of liver transaminase levels: Secondary | ICD-10-CM

## 2020-02-19 DIAGNOSIS — G9341 Metabolic encephalopathy: Secondary | ICD-10-CM | POA: Diagnosis not present

## 2020-02-19 DIAGNOSIS — N179 Acute kidney failure, unspecified: Secondary | ICD-10-CM | POA: Diagnosis not present

## 2020-02-19 DIAGNOSIS — M6282 Rhabdomyolysis: Secondary | ICD-10-CM

## 2020-02-19 LAB — CULTURE, BLOOD (ROUTINE X 2): Special Requests: ADEQUATE

## 2020-02-19 LAB — RENAL FUNCTION PANEL
Albumin: 2.7 g/dL — ABNORMAL LOW (ref 3.5–5.0)
Anion gap: 20 — ABNORMAL HIGH (ref 5–15)
BUN: 131 mg/dL — ABNORMAL HIGH (ref 8–23)
CO2: 23 mmol/L (ref 22–32)
Calcium: 9.3 mg/dL (ref 8.9–10.3)
Chloride: 97 mmol/L — ABNORMAL LOW (ref 98–111)
Creatinine, Ser: 6.67 mg/dL — ABNORMAL HIGH (ref 0.44–1.00)
GFR, Estimated: 6 mL/min — ABNORMAL LOW (ref >60)
Glucose, Bld: 102 mg/dL — ABNORMAL HIGH (ref 70–99)
Phosphorus: 5.8 mg/dL — ABNORMAL HIGH (ref 2.5–4.6)
Potassium: 3.5 mmol/L (ref 3.5–5.1)
Sodium: 140 mmol/L (ref 135–145)

## 2020-02-19 LAB — CBC
HCT: 33.7 % — ABNORMAL LOW (ref 36.0–46.0)
Hemoglobin: 10.9 g/dL — ABNORMAL LOW (ref 12.0–15.0)
MCH: 22 pg — ABNORMAL LOW (ref 26.0–34.0)
MCHC: 32.3 g/dL (ref 30.0–36.0)
MCV: 68.1 fL — ABNORMAL LOW (ref 80.0–100.0)
Platelets: 120 10*3/uL — ABNORMAL LOW (ref 150–400)
RBC: 4.95 MIL/uL (ref 3.87–5.11)
RDW: 21.4 % — ABNORMAL HIGH (ref 11.5–15.5)
WBC: 4.8 10*3/uL (ref 4.0–10.5)
nRBC: 2.7 % — ABNORMAL HIGH (ref 0.0–0.2)

## 2020-02-19 LAB — PROCALCITONIN: Procalcitonin: 11.51 ng/mL

## 2020-02-19 LAB — ECHOCARDIOGRAM COMPLETE
AR max vel: 1.5 cm2
AV Area VTI: 1.32 cm2
AV Area mean vel: 1.41 cm2
AV Mean grad: 2 mmHg
AV Peak grad: 3.4 mmHg
Ao pk vel: 0.92 m/s
Area-P 1/2: 5.06 cm2
Height: 62 in
MV VTI: 1.06 cm2
S' Lateral: 5.2 cm
Single Plane A4C EF: 16.8 %
Weight: 2945.6 oz

## 2020-02-19 LAB — URIC ACID: Uric Acid, Serum: 17 mg/dL — ABNORMAL HIGH (ref 2.5–7.1)

## 2020-02-19 MED ORDER — COLCHICINE 0.6 MG PO TABS
0.3000 mg | ORAL_TABLET | Freq: Two times a day (BID) | ORAL | Status: DC
  Administered 2020-02-19 – 2020-02-20 (×2): 0.3 mg via ORAL
  Filled 2020-02-19: qty 0.5
  Filled 2020-02-19 (×2): qty 1
  Filled 2020-02-19: qty 0.5
  Filled 2020-02-19: qty 1
  Filled 2020-02-19: qty 0.5

## 2020-02-19 MED ORDER — CEFAZOLIN SODIUM-DEXTROSE 1-4 GM/50ML-% IV SOLN
1.0000 g | INTRAVENOUS | Status: DC
  Administered 2020-02-19 – 2020-02-22 (×3): 1 g via INTRAVENOUS
  Filled 2020-02-19 (×6): qty 50

## 2020-02-19 MED ORDER — MORPHINE SULFATE (PF) 2 MG/ML IV SOLN
0.5000 mg | Freq: Once | INTRAVENOUS | Status: AC
  Administered 2020-02-19: 0.5 mg via INTRAVENOUS
  Filled 2020-02-19: qty 1

## 2020-02-19 NOTE — Progress Notes (Signed)
ID Pt has abdominal pain Patient Vitals for the past 24 hrs:  BP Temp Temp src Pulse Resp SpO2 Weight  02/19/20 1142 115/87 (!) 97.5 F (36.4 C) Oral (!) 119 20 97 % --  02/19/20 0745 101/89 97.6 F (36.4 C) Axillary 70 20 100 % --  02/19/20 0656 118/90 97.8 F (36.6 C) Oral 75 19 93 % --  02/19/20 0603 -- -- -- -- -- 92 % --  02/19/20 0350 (!) 114/96 97.6 F (36.4 C) Oral 73 17 92 % --  02/19/20 0100 -- -- -- -- -- -- 83.5 kg  02/18/20 2228 110/83 97.6 F (36.4 C) Oral 72 19 100 % --  02/18/20 1918 103/83 (!) 97.5 F (36.4 C) Oral (!) 116 18 99 % --  02/18/20 1607 93/77 97.7 F (36.5 C) Axillary 67 -- 99 % --    Confused,cheyne stokes Chest b/l air entry Hss1s2 Abd soft, tenderness epigastrium Edema less in the legs No tenderness feet Cns moves all limbs  Labs CBC Latest Ref Rng & Units 02/19/2020 02/18/2020 02/17/2020  WBC 4.0 - 10.5 K/uL 4.8 6.3 8.4  Hemoglobin 12.0 - 15.0 g/dL 10.9(L) 10.6(L) 9.9(L)  Hematocrit 36.0 - 46.0 % 33.7(L) 32.4(L) 30.4(L)  Platelets 150 - 400 K/uL 120(L) 124(L) 137(L)    CMP Latest Ref Rng & Units 02/19/2020 02/18/2020 02/17/2020  Glucose 70 - 99 mg/dL 102(H) 108(H) 103(H)  BUN 8 - 23 mg/dL 131(H) 141(H) 147(H)  Creatinine 0.44 - 1.00 mg/dL 6.67(H) 7.35(H) 8.13(H)  Sodium 135 - 145 mmol/L 140 139 140  Potassium 3.5 - 5.1 mmol/L 3.5 3.7 4.2  Chloride 98 - 111 mmol/L 97(L) 97(L) 102  CO2 22 - 32 mmol/L 23 22 19(L)  Calcium 8.9 - 10.3 mg/dL 9.3 9.1 8.8(L)  Total Protein 6.5 - 8.1 g/dL - 6.6 -  Total Bilirubin 0.3 - 1.2 mg/dL - 2.5(H) -  Alkaline Phos 38 - 126 U/L - 131(H) -  AST 15 - 41 U/L - 121(H) -  ALT 0 - 44 U/L - 61(H) -    Impression/recommendation  Acute metabolic encephalopathy due to uremia and substance use  AKI on CKD- due to cocaine use, dehydration, possible hepatorenal syndrome Because of persistent encephalopathy may need dialysis  Strep mitis /staph epi in 1 of 4 bottle- contaminant Repeat blood culture neg so far Dc  ceftriaxone, will keep her on cefazolin until things become more clear  Left knee effusion- tender- awaiting arthrocentesis- could be from gout  Dilated cardiomyopathy- EF < 20 Cocaine playing a role  Abd pain- recommend CT abdomen  Liver cirrhosis  Mild thrombocytopenia  Abnormal LFTS  Rhabdomyolysis Polysubstance use  Recommend palliative consult  Discussed the management with he nurses ID will follow her peripherally this weekend- call if needed

## 2020-02-19 NOTE — Consult Note (Signed)
Hematology/Oncology Consult note [facility] Telephone:([phone] [phone]  Patient Care Team: Alene Mires Elyse Jarvis, MD as PCP - General (Family Medicine) Center, [facility] as Referring Physician   Name of the patient: Katelyn Lane  [id number]  Sep 16, 1955    Reason for consult: Thrombocytopenia  \Requesting physician: Dr. 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  She was brought to the ER when she was found by her nephew at a [employer] on the floor.  Patient was recently admitted to the hospital and required temporary dialysis.  She declined placement to a skilled nursing facility.  Hematology presently consulted for her thrombocytopenia.  CBC from 02/19/2020 shows white count of 4.8, H&H of 10.9/33.7 with an MCV of 68.1 and a platelet count of 120.  Looking back at her prior CBCs over the last 3 to 4 months patient has chronic microcytic anemia and presently her hemoglobin is close to her baseline.  Platelet count have been in the 300s to 400s in the past but over the last 3 days they have slowly trended down from 1 58-1 37-1 20 presently.    Review of systems- Review of Systems  Unable to perform ROS: Mental acuity    Allergies  Allergen Reactions  . Penicillins Hives, Rash and Other (See Comments)    Did it involve swelling of the face/tongue/throat, SOB, or low BP? No Did it involve sudden or severe rash/hives, skin peeling, or any reaction on the inside of your mouth or nose? Yes Did you need to seek medical attention at a hospital or doctor's office? Yes When did it last happen? >25 years If all above answers are "NO", may proceed with cephalosporin use.   . Ace Inhibitors  Nausea And Vomiting    Patient Active Problem List   Diagnosis Date Noted  . AKI (acute kidney injury) ([place]) 02/16/2020  . Transaminitis 02/16/2020  . Chronic systolic CHF (congestive heart failure) ([place]) 02/16/2020  . Normocytic anemia 01/28/2020  . Multiple organ failure with heart failure ([place]) 01/28/2020  . Hypotension, chronic 01/28/2020  . GERD (gastroesophageal reflux disease) 01/28/2020  . Constipation 01/28/2020  . Hyponatremia 01/28/2020  . Alcoholic cirrhosis of liver with ascites ([place])   . Cardiorenal disease   . Goals of care, counseling/discussion   . Palliative care by specialist   . Dementia ([place]) 10/29/2019  . Cocaine abuse ([place]) 10/20/2019  . History of anemia due to chronic kidney disease 06/19/2019  . Acute on chronic combined systolic (congestive) and diastolic (congestive) heart failure ([place]) 06/03/2019  . Accelerated hypertension   . Nonsustained ventricular tachycardia ([place])   . Chronic obstructive pulmonary disease ([place])   . Pulmonary hypertension ([place])   . Acute metabolic encephalopathy [id number]  . History of substance abuse ([place]) 05/21/2019  . COVID-19 03/06/2019  . Hypokalemia 03/06/2019  . Acute renal failure superimposed on stage 3a chronic kidney disease ([place]) 03/06/2019  . Abdominal pain 02/16/2019  . Rhabdomyolysis 02/16/2019  . Tobacco abuse 02/16/2019  . History of CVA (cerebrovascular accident) 01/18/2019  . Hypertensive urgency 12/31/2018  . Hyperlipidemia 12/31/2018  . Depression 12/31/2018     Past Medical History:  Diagnosis Date  . CHF (congestive heart failure) ([place])   . COVID-19   . Hyperlipidemia   . Hypertension   .  Renal disorder      Past Surgical History:  Procedure Laterality Date  . DIALYSIS/PERMA CATHETER INSERTION N/A 10/28/2019   Procedure: DIALYSIS/PERMA CATHETER INSERTION;  Surgeon: Katha Cabal, MD;  Location: [place] [facility];  Service: Cardiovascular;  Laterality: N/A;  . DIALYSIS/PERMA  CATHETER REMOVAL N/A 11/13/2019   Procedure: DIALYSIS/PERMA CATHETER REMOVAL;  Surgeon: Algernon Huxley, MD;  Location: [place] [facility];  Service: Cardiovascular;  Laterality: N/A;  . RIGHT/LEFT HEART CATH AND CORONARY ANGIOGRAPHY N/A 12/30/2018   Procedure: RIGHT/LEFT HEART CATH AND CORONARY ANGIOGRAPHY;  Surgeon: Corey Skains, MD;  Location: [employer] [facility];  Service: Cardiovascular;  Laterality: N/A;    Social History   Socioeconomic History  . Marital status: Single    Spouse name: Not on file  . Number of children: Not on file  . Years of education: Not on file  . Highest education level: Not on file  Occupational History  . Not on file  Tobacco Use  . Smoking status: Current Every Day Smoker  . Smokeless tobacco: Never Used  Substance and Sexual Activity  . Alcohol use: No    Alcohol/week: 0.0 standard drinks  . Drug use: Yes    Types: Cocaine    Comment: 3-4 days ago  . Sexual activity: Not on file  Other Topics Concern  . Not on file  Social History Narrative  . Not on file   Social Determinants of Health   Financial Resource Strain: Not on file  Food Insecurity: Not on file  Transportation Needs: Not on file  Physical Activity: Not on file  Stress: Not on file  Social Connections: Not on file  Intimate Partner Violence: Not on file     Family History  Problem Relation Age of Onset  . Breast cancer Neg Hx      Current Facility-Administered Medications:  .  0.9 %  sodium chloride infusion, 250 mL, Intravenous, PRN, Agbata, Tochukwu, MD .  albuterol (PROVENTIL) (2.5 MG/3ML) 0.083% nebulizer solution 3 mL, 3 mL, Inhalation, Q6H PRN, Agbata, Tochukwu, MD, 3 mL at 02/19/20 0601 .  allopurinol (ZYLOPRIM) tablet 100 mg, 100 mg, Oral, Daily, Agbata, Tochukwu, MD, 100 mg at 02/19/20 1029 .  apixaban (ELIQUIS) tablet 2.5 mg, 2.5 mg, Oral, BID, Lorella Nimrod, MD, 2.5 mg at 02/19/20 1028 .  ascorbic acid (VITAMIN C) tablet 500 mg, 500 mg, Oral, Daily,  Agbata, Tochukwu, MD, 500 mg at 02/19/20 1028 .  ceFAZolin (ANCEF) IVPB 1 g/50 mL premix, 1 g, Intravenous, Q24H, Ravishankar, Jayashree, MD .  colchicine tablet 0.3 mg, 0.3 mg, Oral, BID, Amin, Sumayya, MD .  ferrous sulfate tablet 325 mg, 325 mg, Oral, Q breakfast, Agbata, Tochukwu, MD, 325 mg at 02/19/20 1028 .  FLUoxetine (PROZAC) capsule 30 mg, 30 mg, Oral, Daily, Agbata, Tochukwu, MD, 30 mg at 02/19/20 1033 .  lip balm (BLISTEX) ointment, , Topical, PRN, Lorella Nimrod, MD, Given at 02/17/20 1717 .  loratadine (CLARITIN) tablet 10 mg, 10 mg, Oral, Daily PRN, Agbata, Tochukwu, MD .  midodrine (PROAMATINE) tablet 10 mg, 10 mg, Oral, TID WC, Agbata, Tochukwu, MD, 10 mg at 02/19/20 1226 .  nitroGLYCERIN (NITROSTAT) SL tablet 0.4 mg, 0.4 mg, Sublingual, Q5 min PRN, Agbata, Tochukwu, MD .  ondansetron (ZOFRAN) tablet 4 mg, 4 mg, Oral, Q6H PRN **OR** ondansetron (ZOFRAN) injection 4 mg, 4 mg, Intravenous, Q6H PRN, Agbata, Tochukwu, MD .  pantoprazole (PROTONIX) EC tablet 40 mg, 40 mg, Oral, BID, Agbata, Tochukwu, MD, 40 mg at  02/19/20 1029 .  sodium chloride flush (NS) 0.9 % injection 3 mL, 3 mL, Intravenous, Q12H, Agbata, Tochukwu, MD, 3 mL at 02/19/20 1029 .  sodium chloride flush (NS) 0.9 % injection 3 mL, 3 mL, Intravenous, PRN, Agbata, Tochukwu, MD .  thiothixene (NAVANE) capsule 1 mg, 1 mg, Oral, BID, Agbata, Tochukwu, MD, 1 mg at 02/19/20 1029   Physical exam:  Vitals:   02/19/20 0745 02/19/20 1142 02/19/20 1348 02/19/20 1551  BP: 101/89 115/87 99/84 100/86  Pulse: 70 (!) 119 90 (!) 120  Resp: '20 20 20 20  '$ Temp: 97.6 F (36.4 C) (!) 97.5 F (36.4 C) (!) 97.3 F (36.3 C) 97.6 F (36.4 C)  TempSrc: Axillary Oral Oral Oral  SpO2: 100% 97% 99% 100%  Weight:      Height:       Physical Exam Constitutional:      Comments: Awake and oriented to self and place. Slow to respond to verbal commands  Eyes:     Extraocular Movements: EOM normal.  Cardiovascular:     Rate and Rhythm:  Normal rate and regular rhythm.     Heart sounds: Normal heart sounds.  Pulmonary:     Effort: Pulmonary effort is normal.     Breath sounds: Normal breath sounds.  Abdominal:     General: Bowel sounds are normal.     Palpations: Abdomen is soft.  Musculoskeletal:     Comments: Left knee appears swollen and tender to touch  Skin:    General: Skin is warm and dry.        CMP Latest Ref Rng & Units 02/19/2020  Glucose 70 - 99 mg/dL 102(H)  BUN 8 - 23 mg/dL 131(H)  Creatinine 0.44 - 1.00 mg/dL 6.67(H)  Sodium 135 - 145 mmol/L 140  Potassium 3.5 - 5.1 mmol/L 3.5  Chloride 98 - 111 mmol/L 97(L)  CO2 22 - 32 mmol/L 23  Calcium 8.9 - 10.3 mg/dL 9.3  Total Protein 6.5 - 8.1 g/dL -  Total Bilirubin 0.3 - 1.2 mg/dL -  Alkaline Phos 38 - 126 U/L -  AST 15 - 41 U/L -  ALT 0 - 44 U/L -   CBC Latest Ref Rng & Units 02/19/2020  WBC 4.0 - 10.5 K/uL 4.8  Hemoglobin 12.0 - 15.0 g/dL 10.9(L)  Hematocrit 36.0 - 46.0 % 33.7(L)  Platelets 150 - 400 K/uL 120(L)    '@IMAGES'$ @  CT Head Wo Contrast  Result Date: 02/16/2020 CLINICAL DATA:  Altered mental status EXAM: CT HEAD WITHOUT CONTRAST TECHNIQUE: Contiguous axial images were obtained from the base of the skull through the vertex without intravenous contrast. COMPARISON:  08/15/2019 FINDINGS: Brain: There is no acute intracranial hemorrhage, mass effect, or edema. Gray-white differentiation is preserved. There is no extra-axial fluid collection. Ventricles and sulci are stable in size and configuration. Patchy hypoattenuation in the supratentorial white matter is nonspecific but may reflect stable mild chronic microvascular ischemic changes. 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 Vascular: There is atherosclerotic calcification at the skull base. Skull: Calvarium is unremarkable. Sinuses/Orbits: No acute finding. Other: None. IMPRESSION: No acute intracranial abnormality. Electronically Signed   By: Macy Mis M.D.   On:  02/16/2020 14:23   CT Cervical Spine Wo Contrast  Result Date: 02/16/2020 CLINICAL DATA:  Altered mental status, possible trauma EXAM: CT CERVICAL SPINE WITHOUT CONTRAST TECHNIQUE: Multidetector CT imaging of the cervical spine was performed without intravenous contrast. Multiplanar CT image reconstructions were also generated. COMPARISON:  None. FINDINGS: Motion artifact is present. Alignment: Trace retrolisthesis at C3-C4 and C6-C7. Skull base and vertebrae: No acute fracture within the above limitation. Vertebral body heights are maintained apart from mild degenerative endplate irregularity. Soft tissues and spinal canal: No prevertebral fluid or swelling. No visible canal hematoma. Disc levels:  Mild multilevel degenerative changes are present. Upper chest: Minimally imaged. Other: Calcified plaque at the common carotid bifurcations. IMPRESSION: No acute fracture. Study is degraded by motion artifact and a subtle fracture may not be apparent. Electronically Signed   By: Macy Mis M.D.   On: 02/16/2020 14:30   US Paracentesis  Result Date: 01/27/2020 INDICATION: Cirrhosis with ascites EXAM: ULTRASOUND GUIDED  PARACENTESIS MEDICATIONS: None. COMPLICATIONS: None immediate. PROCEDURE: Informed written consent was obtained from the patient after a discussion of the risks, benefits and alternatives to treatment. A timeout was performed prior to the initiation of the procedure. Initial ultrasound scanning demonstrates a moderate amount of ascites within the right lower abdominal quadrant. The right lower abdomen was prepped and draped in the usual sterile fashion. 1% lidocaine was used for local anesthesia. Following this, a 6 Fr Safe-T-Centesis catheter was introduced. An ultrasound image was saved for documentation purposes. The paracentesis was performed. The catheter was removed and a dressing was applied. The patient tolerated the procedure well without immediate post procedural complication. FINDINGS:  A total of approximately 5.3 L of straw-colored fluid was removed. Samples were sent to the laboratory as requested by the clinical team. IMPRESSION: Successful ultrasound-guided paracentesis yielding 5.3 liters of peritoneal fluid. Electronically Signed   By: Miachel Roux M.D.   On: 01/27/2020 16:57   DG Chest Portable 1 View  Result Date: 02/16/2020 CLINICAL DATA:  Altered mental status EXAM: PORTABLE CHEST 1 VIEW COMPARISON:  01/06/2020 FINDINGS: Small left upper lung opacity. No pleural effusion. No pneumothorax. Stable cardiomegaly. IMPRESSION: Small left upper lung atelectasis/consolidation. Stable cardiomegaly. Electronically Signed   By: Macy Mis M.D.   On: 02/16/2020 14:19   DG Knee Complete 4 Views Left  Result Date: 02/18/2020 CLINICAL DATA:  Left knee pain after fall. EXAM: LEFT KNEE - COMPLETE 4+ VIEW COMPARISON:  None. FINDINGS: No evidence of fracture, dislocation, or joint effusion. No evidence of arthropathy or other focal bone abnormality. Soft tissues are unremarkable. IMPRESSION: Negative. Electronically Signed   By: Marijo Conception M.D.   On: 02/18/2020 10:42   ECHOCARDIOGRAM COMPLETE  Result Date: 02/19/2020    ECHOCARDIOGRAM REPORT   Patient Name:   ALLINE ROTAN Date of Exam: 02/19/2020 Medical Rec #:  [id number]     Height:       62.0 in Accession #:    [id number]    Weight:       184.1 lb Date of Birth:  09-14-1955    BSA:          1.846 m Patient Age:    6 years      BP:           101/89 mmHg Patient Gender: F             HR:           119 bpm. Exam Location:  [facility] Procedure: 2D Echo, Color Doppler and Cardiac Doppler Indications:     R78.81 Bacteremia  History:         Patient has prior history of Echocardiogram examinations, most                  recent 06/22/2019. CHF,  CKD; Risk Factors:Hypertension and                  Dyslipidemia. Hx of COVID-19.  Sonographer:     Charmayne Sheer RDCS (AE) Referring Phys:  [id number] Tsosie Billing Diagnosing Phys: Neoma Laming MD   Sonographer Comments: Image acquisition challenging due to uncooperative patient. IMPRESSIONS  1. Left ventricular ejection fraction, by estimation, is <20%. The left ventricle has severely decreased function. The left ventricle demonstrates global hypokinesis. The left ventricular internal cavity size was severely dilated. There is mild concentric left ventricular hypertrophy. Left ventricular diastolic parameters are consistent with Grade III diastolic dysfunction (restrictive).  2. Right ventricular systolic function is severely reduced. The right ventricular size is severely enlarged. Mildly increased right ventricular wall thickness.  3. Left atrial size was severely dilated.  4. Right atrial size was severely dilated.  5. The mitral valve is degenerative. Mild mitral valve regurgitation. Moderate mitral annular calcification.  6. The tricuspid valve is degenerative. Tricuspid valve regurgitation is severe.  7. The aortic valve is calcified. Aortic valve regurgitation is not visualized. Mild aortic valve sclerosis is present, with no evidence of aortic valve stenosis. Conclusion(s)/Recommendation(s): Findings consistent with dilated cardiomyopathy. FINDINGS  Left Ventricle: Left ventricular ejection fraction, by estimation, is <20%. The left ventricle has severely decreased function. The left ventricle demonstrates global hypokinesis. The left ventricular internal cavity size was severely dilated. There is mild concentric left ventricular hypertrophy. Left ventricular diastolic parameters are consistent with Grade III diastolic dysfunction (restrictive). Right Ventricle: The right ventricular size is severely enlarged. Mildly increased right ventricular wall thickness. Right ventricular systolic function is severely reduced. Left Atrium: Left atrial size was severely dilated. Right Atrium: Right atrial size was severely dilated. Pericardium: There is no evidence of pericardial effusion. Mitral Valve: The mitral  valve is degenerative in appearance. Moderate mitral annular calcification. Mild mitral valve regurgitation. MV peak gradient, 5.1 mmHg. The mean mitral valve gradient is 2.0 mmHg. Tricuspid Valve: The tricuspid valve is degenerative in appearance. Tricuspid valve regurgitation is severe. Aortic Valve: The aortic valve is calcified. Aortic valve regurgitation is not visualized. Mild aortic valve sclerosis is present, with no evidence of aortic valve stenosis. Aortic valve mean gradient measures 2.0 mmHg. Aortic valve peak gradient measures 3.4 mmHg. Aortic valve area, by VTI measures 1.32 cm. Pulmonic Valve: The pulmonic valve was grossly normal. Pulmonic valve regurgitation is mild to moderate. Aorta: The aortic root was not well visualized. IAS/Shunts: No atrial level shunt detected by color flow Doppler.  LEFT VENTRICLE PLAX 2D LVIDd:         5.60 cm      Diastology LVIDs:         5.20 cm      LV e' medial:    3.81 cm/s LV PW:         1.10 cm      LV E/e' medial:  22.7 LV IVS:        0.70 cm      LV e' lateral:   8.05 cm/s LVOT diam:     1.80 cm      LV E/e' lateral: 10.7 LV SV:         15 LV SV Index:   8 LVOT Area:     2.54 cm  LV Volumes (MOD) LV vol d, MOD A4C: 184.0 ml LV vol s, MOD A4C: 153.0 ml LV SV MOD A4C:     184.0 ml RIGHT VENTRICLE RV Basal diam:  4.80 cm  LEFT ATRIUM           Index       RIGHT ATRIUM           Index LA diam:      5.10 cm 2.76 cm/m  RA Area:     29.90 cm LA Vol (A2C): 41.8 ml 22.65 ml/m RA Volume:   115.00 ml 62.31 ml/m LA Vol (A4C): 68.2 ml 36.95 ml/m  AORTIC VALVE                   PULMONIC VALVE AV Area (Vmax):    1.50 cm    PV Vmax:       0.69 m/s AV Area (Vmean):   1.41 cm    PV Vmean:      43.300 cm/s AV Area (VTI):     1.32 cm    PV VTI:        0.070 m AV Vmax:           92.20 cm/s  PV Peak grad:  1.9 mmHg AV Vmean:          63.700 cm/s PV Mean grad:  1.0 mmHg AV VTI:            0.112 m AV Peak Grad:      3.4 mmHg AV Mean Grad:      2.0 mmHg LVOT Vmax:         54.20  cm/s LVOT Vmean:        35.400 cm/s LVOT VTI:          0.058 m LVOT/AV VTI ratio: 0.52  AORTA Ao Root diam: 3.10 cm MITRAL VALVE               TRICUSPID VALVE MV Area (PHT): 5.06 cm    TR Peak grad:   29.2 mmHg MV Area VTI:   1.06 cm    TR Vmax:        270.00 cm/s MV Peak grad:  5.1 mmHg MV Mean grad:  2.0 mmHg    SHUNTS MV Vmax:       1.13 m/s    Systemic VTI:  0.06 m MV Vmean:      67.8 cm/s   Systemic Diam: 1.80 cm MV Decel Time: 150 msec MV E velocity: 86.53 cm/s Neoma Laming MD Electronically signed by Neoma Laming MD Signature Date/Time: 02/19/2020/12:46:01 PM    Final    CT MAXILLOFACIAL WO CONTRAST  Result Date: 01/22/2020 CLINICAL DATA:  Facial pain and sinus drainage EXAM: CT MAXILLOFACIAL WITHOUT CONTRAST TECHNIQUE: Multidetector CT imaging of the maxillofacial structures was performed. Multiplanar CT image reconstructions were also generated. COMPARISON:  None. FINDINGS: Osseous: No fracture or mandibular dislocation. No destructive process. Orbits: Negative. No traumatic or inflammatory finding. Sinuses: Moderate left maxillary sinus mucosal thickening. The other paranasal sinuses are clear. No mastoid effusion. Soft tissues: Negative. Limited intracranial: Normal IMPRESSION: Moderate left maxillary sinus mucosal thickening. Correlate for symptoms of acute sinusitis. Electronically Signed   By: Ulyses Jarred M.D.   On: 01/22/2020 23:02    Assessment and plan- Patient is a 64 y.o. female with multiple medical problems including liver cirrhosis currently admitted for acute encephalopathy found to have thrombocytopenia  Thrombocytopenia: Currently mild.  She is being treated for possible gram-positive bacteremia source unclear if thrombocytopenia is secondary to that.  There was also concern for rhabdomyolysis following fall.  I will check a PT PTT INR fibrinogen, smear review, B12 and folate at this time.  Continue  to monitor platelet counts.  No other intervention warranted at this time.  The  only new medication that she has been started on is cefazolin starting today but her thrombocytopenia has preceded that.   Visit Diagnosis 1. Acute renal failure, unspecified acute renal failure type ([place])   2. Non-traumatic rhabdomyolysis   3. Altered mental status, unspecified altered mental status type   4. Fall     Dr. Randa Evens, MD, MPH [facility] [id number] 02/19/2020 9:27 PM

## 2020-02-19 NOTE — Progress Notes (Signed)
*  PRELIMINARY RESULTS* Echocardiogram 2D Echocardiogram has been performed.  Katelyn Lane 02/19/2020, 9:40 AM

## 2020-02-19 NOTE — Progress Notes (Signed)
CCMD called to report multiple instances of runs of vtach/pt asymptomatic. MD made aware/no new orders at this time.

## 2020-02-19 NOTE — Progress Notes (Signed)
Pt c/o of severe abdominal pain/per sitter, pt had a formed red stool. MD aware/ no new orders. Continue to monitor pt's BMs and hemoglobin. Pt now resting comfortably.
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
## 2020-02-20 LAB — RENAL FUNCTION PANEL
Albumin: 2.6 g/dL — ABNORMAL LOW (ref 3.5–5.0)
Anion gap: 25 — ABNORMAL HIGH (ref 5–15)
BUN: 127 mg/dL — ABNORMAL HIGH (ref 8–23)
CO2: 21 mmol/L — ABNORMAL LOW (ref 22–32)
Calcium: 9.6 mg/dL (ref 8.9–10.3)
Chloride: 95 mmol/L — ABNORMAL LOW (ref 98–111)
Creatinine, Ser: 6.58 mg/dL — ABNORMAL HIGH (ref 0.44–1.00)
GFR, Estimated: 7 mL/min — ABNORMAL LOW (ref >60)
Glucose, Bld: 72 mg/dL (ref 70–99)
Phosphorus: 6.1 mg/dL — ABNORMAL HIGH (ref 2.5–4.6)
Potassium: 3.3 mmol/L — ABNORMAL LOW (ref 3.5–5.1)
Sodium: 141 mmol/L (ref 135–145)

## 2020-02-20 LAB — URINE CULTURE

## 2020-02-20 LAB — CBC
HCT: 35.3 % — ABNORMAL LOW (ref 36.0–46.0)
Hemoglobin: 11.4 g/dL — ABNORMAL LOW (ref 12.0–15.0)
MCH: 22.1 pg — ABNORMAL LOW (ref 26.0–34.0)
MCHC: 32.3 g/dL (ref 30.0–36.0)
MCV: 68.3 fL — ABNORMAL LOW (ref 80.0–100.0)
Platelets: 122 10*3/uL — ABNORMAL LOW (ref 150–400)
RBC: 5.17 MIL/uL — ABNORMAL HIGH (ref 3.87–5.11)
RDW: 21.8 % — ABNORMAL HIGH (ref 11.5–15.5)
WBC: 3.9 10*3/uL — ABNORMAL LOW (ref 4.0–10.5)
nRBC: 3.3 % — ABNORMAL HIGH (ref 0.0–0.2)

## 2020-02-20 LAB — HEPATIC FUNCTION PANEL
ALT: 47 U/L — ABNORMAL HIGH (ref 0–44)
AST: 83 U/L — ABNORMAL HIGH (ref 15–41)
Albumin: 2.5 g/dL — ABNORMAL LOW (ref 3.5–5.0)
Alkaline Phosphatase: 138 U/L — ABNORMAL HIGH (ref 38–126)
Bilirubin, Direct: 1.4 mg/dL — ABNORMAL HIGH (ref 0.0–0.2)
Indirect Bilirubin: 1.2 mg/dL — ABNORMAL HIGH (ref 0.3–0.9)
Total Bilirubin: 2.6 mg/dL — ABNORMAL HIGH (ref 0.3–1.2)
Total Protein: 6.6 g/dL (ref 6.5–8.1)

## 2020-02-20 LAB — TECHNOLOGIST SMEAR REVIEW: Plt Morphology: NONE SEEN

## 2020-02-20 LAB — FOLATE: Folate: 31 ng/mL (ref >5.9)

## 2020-02-20 LAB — PROTIME-INR
INR: 3.3 — ABNORMAL HIGH (ref 0.8–1.2)
Prothrombin Time: 32.4 seconds — ABNORMAL HIGH (ref 11.4–15.2)

## 2020-02-20 LAB — FIBRINOGEN: Fibrinogen: 391 mg/dL (ref 210–475)

## 2020-02-20 LAB — AMMONIA: Ammonia: 28 umol/L (ref 9–35)

## 2020-02-20 LAB — PROCALCITONIN: Procalcitonin: 9.37 ng/mL

## 2020-02-20 LAB — VITAMIN B12: Vitamin B-12: 4150 pg/mL — ABNORMAL HIGH (ref 180–914)

## 2020-02-20 LAB — APTT: aPTT: 46 seconds — ABNORMAL HIGH (ref 24–36)

## 2020-02-20 MED ORDER — MORPHINE SULFATE (PF) 2 MG/ML IV SOLN
0.5000 mg | Freq: Once | INTRAVENOUS | Status: AC
  Administered 2020-02-20: 0.5 mg via INTRAVENOUS
  Filled 2020-02-20: qty 1

## 2020-02-20 MED ORDER — COLCHICINE 0.6 MG PO TABS
0.3000 mg | ORAL_TABLET | Freq: Every day | ORAL | Status: DC
  Administered 2020-02-21 – 2020-02-23 (×3): 0.3 mg via ORAL
  Filled 2020-02-20 (×4): qty 0.5
  Filled 2020-02-20: qty 1
  Filled 2020-02-20: qty 0.5
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
## 2020-02-20 NOTE — Progress Notes (Signed)
[facility]  ROUNDING NOTE   Subjective:   More awake. Alert and oriented x 4.  Creatinine 6.58 (6.67) UOP 189m recorded  Objective:  Vital signs in last 24 hours:  Temp:  [97.4 F (36.3 C)-97.5 F (36.4 C)] 97.5 F (36.4 C) (02/12 0309) Pulse Rate:  [59-124] 124 (02/12 1113) Resp:  [19-24] 20 (02/12 1113) BP: (99-118)/(73-92) 114/87 (02/12 1113) SpO2:  [93 %-100 %] 100 % (02/12 1113)  Weight change:  Filed Weights   02/17/20 0418 02/18/20 0300 02/19/20 0100  Weight: 76.3 kg 78.1 kg 83.5 kg    Intake/Output: I/O last 3 completed shifts: In: 1774.2 [P.O.:480; I.V.:1144.2; IV Piggyback:150] Out: 100 [Urine:100]   Intake/Output this shift:  Total I/O In: 200 [P.O.:200] Out: 25 [Urine:25]  Physical Exam: General: Laying in bed, lethargic  Head: Normocephalic, atraumatic. Moist oral mucosal membranes  Eyes: Anicteric, PERRL  Neck: Supple, trachea midline  Lungs:  Clear to auscultation  Heart: Regular rate and rhythm  Abdomen:  Soft, nontender,   Extremities: no peripheral edema.  Neurologic: Nonfocal, moving all four extremities  Skin: No lesions  Access: none    Basic Metabolic Panel: Recent Labs  Lab 02/16/20 1326 02/17/20 0451 02/18/20 0556 02/19/20 0406 02/20/20 0403  NA 140 140 139 140 141  K 4.8 4.2 3.7 3.5 3.3*  CL 104 102 97* 97* 95*  CO2 14* 19* 22 23 21*  GLUCOSE 75 103* 108* 102* 72  BUN 159* 147* 141* 131* 127*  CREATININE 8.15* 8.13* 7.35* 6.67* 6.58*  CALCIUM 9.4 8.8* 9.1 9.3 9.6  MG  --  2.3  --   --   --   PHOS  --   --   --  5.8* 6.1*    Liver Function Tests: Recent Labs  Lab 02/16/20 1326 02/18/20 0556 02/19/20 0406 02/20/20 0403  AST 191* 121*  --  83*  ALT 80* 61*  --  47*  ALKPHOS 158* 131*  --  138*  BILITOT 2.6* 2.5*  --  2.6*  PROT 7.3 6.6  --  6.6  ALBUMIN 3.0* 2.6* 2.7* 2.6*  2.5*   No results for input(s): LIPASE, AMYLASE in the last 168 hours. Recent Labs  Lab 02/16/20 1508 02/20/20 0403   AMMONIA 25 28    CBC: Recent Labs  Lab 02/16/20 1326 02/17/20 0451 02/18/20 0556 02/19/20 0406 02/20/20 0403  WBC 10.2 8.4 6.3 4.8 3.9*  NEUTROABS 8.6*  --   --   --   --   HGB 11.1* 9.9* 10.6* 10.9* 11.4*  HCT 34.7* 30.4* 32.4* 33.7* 35.3*  MCV 68.2* 67.3* 67.1* 68.1* 68.3*  PLT 158 137* 124* 120* 122*    Cardiac Enzymes: Recent Labs  Lab 02/16/20 1326 02/17/20 0451 02/18/20 0556  CKTOTAL 1,180* 609* 447*    BNP: Invalid input(s): POCBNP  CBG: No results for input(s): GLUCAP in the last 168 hours.  Microbiology: Results for orders placed or performed during the hospital encounter of 02/16/20  Culture, blood (routine x 2)     Status: Abnormal   Collection Time: 02/16/20  2:28 PM   Specimen: BLOOD  Result Value Ref Range Status   Specimen Description   Final    BLOOD BLOOD LEFT HAND [facility] [address] [place] [address]   Special Requests   Final    BOTTLES DRAWN AEROBIC AND ANAEROBIC Blood Culture adequate volume [facility] [address], [place] [address]  Culture  Setup Time   Final    GRAM POSITIVE COCCI AEROBIC BOTTLE ONLY CRITICAL RESULT CALLED TO, READ BACK BY AND VERIFIED WITH: NATHAN BELUE PHARMD [id number] 02/17/20 HNM    Culture (A)  Final    STREPTOCOCCUS MITIS/ORALIS STAPHYLOCOCCUS EPIDERMIDIS THE SIGNIFICANCE OF ISOLATING THIS ORGANISM FROM A SINGLE SET OF BLOOD CULTURES WHEN MULTIPLE SETS ARE DRAWN IS UNCERTAIN. PLEASE NOTIFY THE MICROBIOLOGY DEPARTMENT WITHIN ONE WEEK IF SPECIATION AND SENSITIVITIES ARE REQUIRED. Performed at [facility], [place] [address] [place], [place] [address]    Report Status 02/19/2020 FINAL  Final  Blood Culture ID Panel (Reflexed)     Status: Abnormal   Collection Time: 02/16/20  2:28 PM  Result Value Ref Range Status   Enterococcus faecalis NOT DETECTED NOT DETECTED Final   Enterococcus Faecium NOT DETECTED NOT DETECTED Final    Listeria monocytogenes NOT DETECTED NOT DETECTED Final   Staphylococcus species DETECTED (A) NOT DETECTED Final    Comment: CRITICAL RESULT CALLED TO, READ BACK BY AND VERIFIED WITHLloyd Huger PHARMD [id number] 02/17/20 HNM    Staphylococcus aureus (BCID) NOT DETECTED NOT DETECTED Final   Staphylococcus epidermidis DETECTED (A) NOT DETECTED Final    Comment: Methicillin (oxacillin) resistant coagulase negative staphylococcus. Possible blood culture contaminant (unless isolated from more than one blood culture draw or clinical case suggests pathogenicity). No antibiotic treatment is indicated for blood  culture contaminants. CRITICAL RESULT CALLED TO, READ BACK BY AND VERIFIED WITH: Lloyd Huger PHARMD [id number] 02/17/20 HNM    Staphylococcus lugdunensis NOT DETECTED NOT DETECTED Final   Streptococcus species DETECTED (A) NOT DETECTED Final    Comment: Not Enterococcus species, Streptococcus agalactiae, Streptococcus pyogenes, or Streptococcus pneumoniae. CRITICAL RESULT CALLED TO, READ BACK BY AND VERIFIED WITH: NATHAN BELUE PHARMD [id number] 02/17/20 HNM    Streptococcus agalactiae NOT DETECTED NOT DETECTED Final   Streptococcus pneumoniae NOT DETECTED NOT DETECTED Final   Streptococcus pyogenes NOT DETECTED NOT DETECTED Final   A.calcoaceticus-baumannii NOT DETECTED NOT DETECTED Final   Bacteroides fragilis NOT DETECTED NOT DETECTED Final   Enterobacterales NOT DETECTED NOT DETECTED Final   Enterobacter cloacae complex NOT DETECTED NOT DETECTED Final   Escherichia coli NOT DETECTED NOT DETECTED Final   Klebsiella aerogenes NOT DETECTED NOT DETECTED Final   Klebsiella oxytoca NOT DETECTED NOT DETECTED Final   Klebsiella pneumoniae NOT DETECTED NOT DETECTED Final   Proteus species NOT DETECTED NOT DETECTED Final   Salmonella species NOT DETECTED NOT DETECTED Final   Serratia marcescens NOT DETECTED NOT DETECTED Final   Haemophilus influenzae NOT DETECTED NOT DETECTED Final   Neisseria meningitidis NOT  DETECTED NOT DETECTED Final   Pseudomonas aeruginosa NOT DETECTED NOT DETECTED Final   Stenotrophomonas maltophilia NOT DETECTED NOT DETECTED Final   Candida albicans NOT DETECTED NOT DETECTED Final   Candida auris NOT DETECTED NOT DETECTED Final   Candida glabrata NOT DETECTED NOT DETECTED Final   Candida krusei NOT DETECTED NOT DETECTED Final   Candida parapsilosis NOT DETECTED NOT DETECTED Final   Candida tropicalis NOT DETECTED NOT DETECTED Final   Cryptococcus neoformans/gattii NOT DETECTED NOT DETECTED Final   Methicillin resistance mecA/C DETECTED (A) NOT DETECTED Final    Comment: CRITICAL RESULT CALLED TO, READ BACK BY AND VERIFIED WITHLloyd Huger PHARMD [id number] 02/17/20 HNM Performed at [facility], [address] [place], [place] [address]   Culture, blood (routine x 2)     Status: None (Preliminary result)   Collection Time: 02/16/20  2:38 PM  Specimen: BLOOD  Result Value Ref Range Status   Specimen Description BLOOD RIGHT ANTECUBITAL  Final   Special Requests   Final    BOTTLES DRAWN AEROBIC AND ANAEROBIC Blood Culture adequate volume   Culture   Final    NO GROWTH 4 DAYS Performed at [facility], [address] [place], [place] [address]    Report Status PENDING  Incomplete  Resp Panel by RT-PCR (Flu A&B, Covid) Nasopharyngeal Swab     Status: None   Collection Time: 02/16/20  2:52 PM   Specimen: Nasopharyngeal Swab; Nasopharyngeal(NP) swabs in vial transport medium  Result Value Ref Range Status   SARS Coronavirus 2 by RT PCR NEGATIVE NEGATIVE Final    Comment: (NOTE) SARS-CoV-2 target nucleic acids are NOT DETECTED.  The SARS-CoV-2 RNA is generally detectable in upper respiratory specimens during the acute phase of infection. The lowest concentration of SARS-CoV-2 viral copies this assay can detect is 138 copies/mL. A negative result does not preclude SARS-Cov-2 infection and should not be used as the sole basis for treatment or other  patient management decisions. A negative result may occur with  improper specimen collection/handling, submission of specimen other than nasopharyngeal swab, presence of viral mutation(s) within the areas targeted by this assay, and inadequate number of viral copies(<138 copies/mL). A negative result must be combined with clinical observations, patient history, and epidemiological information. The expected result is Negative.  Fact Sheet for Patients:  EntrepreneurPulse.com.au  Fact Sheet for Healthcare Providers:  IncredibleEmployment.be  This test is no t yet approved or cleared by the [place] FDA and  has been authorized for detection and/or diagnosis of SARS-CoV-2 by FDA under an Emergency Use Authorization (EUA). This EUA will remain  in effect (meaning this test can be used) for the duration of the COVID-19 declaration under Section 564(b)(1) of the Act, 21 U.S.C.section 360bbb-3(b)(1), unless the authorization is terminated  or revoked sooner.       Influenza A by PCR NEGATIVE NEGATIVE Final   Influenza B by PCR NEGATIVE NEGATIVE Final    Comment: (NOTE) The Xpert Xpress SARS-CoV-2/FLU/RSV plus assay is intended as an aid in the diagnosis of influenza from Nasopharyngeal swab specimens and should not be used as a sole basis for treatment. Nasal washings and aspirates are unacceptable for Xpert Xpress SARS-CoV-2/FLU/RSV testing.  Fact Sheet for Patients: EntrepreneurPulse.com.au  Fact Sheet for Healthcare Providers: IncredibleEmployment.be  This test is not yet approved or cleared by the [place] FDA and has been authorized for detection and/or diagnosis of SARS-CoV-2 by FDA under an Emergency Use Authorization (EUA). This EUA will remain in effect (meaning this test can be used) for the duration of the COVID-19 declaration under Section 564(b)(1) of the Act, 21 U.S.C. section  360bbb-3(b)(1), unless the authorization is terminated or revoked.  Performed at [facility], [address] [place], [place] [address]   CULTURE, BLOOD (ROUTINE X 2) w Reflex to ID Panel     Status: None (Preliminary result)   Collection Time: 02/17/20 12:19 PM   Specimen: BLOOD  Result Value Ref Range Status   Specimen Description BLOOD BLOOD RIGHT HAND  Final   Special Requests   Final    BOTTLES DRAWN AEROBIC AND ANAEROBIC Blood Culture adequate volume   Culture   Final    NO GROWTH 3 DAYS Performed at [facility], [address] [place], [place] [address]    Report Status PENDING  Incomplete  CULTURE, BLOOD (ROUTINE X 2) w  Reflex to ID Panel     Status: None (Preliminary result)   Collection Time: 02/17/20 12:22 PM   Specimen: BLOOD  Result Value Ref Range Status   Specimen Description BLOOD RIGHT ANTECUBITAL  Final   Special Requests   Final    BOTTLES DRAWN AEROBIC AND ANAEROBIC Blood Culture adequate volume   Culture   Final    NO GROWTH 3 DAYS Performed at [facility], [address], [place], [place] [address]    Report Status PENDING  Incomplete  C Difficile Quick Screen w PCR reflex     Status: None   Collection Time: 02/18/20  9:19 AM   Specimen: STOOL  Result Value Ref Range Status   C Diff antigen NEGATIVE NEGATIVE Final   C Diff toxin NEGATIVE NEGATIVE Final   C Diff interpretation No C. difficile detected.  Final    Comment: Performed at [facility], [address] [place], [place] [address]  Urine Culture     Status: Abnormal   Collection Time: 02/18/20  1:12 PM   Specimen: Urine, Random  Result Value Ref Range Status   Specimen Description   Final    URINE, RANDOM Performed at [facility], [address] [place], [place] [address]    Special Requests   Final    NONE Performed at [facility], [address] [place], [place] [address]    Culture MULTIPLE SPECIES PRESENT, SUGGEST  RECOLLECTION (A)  Final   Report Status 02/20/2020 FINAL  Final    Coagulation Studies: Recent Labs    02/20/20 0403  LABPROT 32.4*  INR 3.3*    Urinalysis: Recent Labs    02/18/20 0750  COLORURINE YELLOW*  LABSPEC 1.013  PHURINE 5.0  GLUCOSEU NEGATIVE  HGBUR LARGE*  BILIRUBINUR NEGATIVE  KETONESUR NEGATIVE  PROTEINUR 30*  NITRITE NEGATIVE  LEUKOCYTESUR MODERATE*      Imaging: ECHOCARDIOGRAM COMPLETE  Result Date: 02/19/2020    ECHOCARDIOGRAM REPORT   Patient Name:   MICHALE LAWWILL Date of Exam: 02/19/2020 Medical Rec #:  [id number]     Height:       62.0 in Accession #:    [id number]    Weight:       184.1 lb Date of Birth:  01/19/1955    BSA:          1.846 m Patient Age:    4 years      BP:           101/89 mmHg Patient Gender: F             HR:           119 bpm. Exam Location:  [facility] Procedure: 2D Echo, Color Doppler and Cardiac Doppler Indications:     R78.81 Bacteremia  History:         Patient has prior history of Echocardiogram examinations, most                  recent 06/22/2019. CHF, CKD; Risk Factors:Hypertension and                  Dyslipidemia. Hx of COVID-19.  Sonographer:     Charmayne Sheer RDCS (AE) Referring Phys:  [id number] Tsosie Billing Diagnosing Phys: Neoma Laming MD  Sonographer Comments: Image acquisition challenging due to uncooperative patient. IMPRESSIONS  1. Left ventricular ejection fraction, by estimation, is <20%. The left ventricle has severely decreased function. The left ventricle demonstrates global hypokinesis. The left ventricular internal  cavity size was severely dilated. There is mild concentric left ventricular hypertrophy. Left ventricular diastolic parameters are consistent with Grade III diastolic dysfunction (restrictive).  2. Right ventricular systolic function is severely reduced. The right ventricular size is severely enlarged. Mildly increased right ventricular wall thickness.  3. Left atrial size was severely dilated.  4. Right atrial  size was severely dilated.  5. The mitral valve is degenerative. Mild mitral valve regurgitation. Moderate mitral annular calcification.  6. The tricuspid valve is degenerative. Tricuspid valve regurgitation is severe.  7. The aortic valve is calcified. Aortic valve regurgitation is not visualized. Mild aortic valve sclerosis is present, with no evidence of aortic valve stenosis. Conclusion(s)/Recommendation(s): Findings consistent with dilated cardiomyopathy. FINDINGS  Left Ventricle: Left ventricular ejection fraction, by estimation, is <20%. The left ventricle has severely decreased function. The left ventricle demonstrates global hypokinesis. The left ventricular internal cavity size was severely dilated. There is mild concentric left ventricular hypertrophy. Left ventricular diastolic parameters are consistent with Grade III diastolic dysfunction (restrictive). Right Ventricle: The right ventricular size is severely enlarged. Mildly increased right ventricular wall thickness. Right ventricular systolic function is severely reduced. Left Atrium: Left atrial size was severely dilated. Right Atrium: Right atrial size was severely dilated. Pericardium: There is no evidence of pericardial effusion. Mitral Valve: The mitral valve is degenerative in appearance. Moderate mitral annular calcification. Mild mitral valve regurgitation. MV peak gradient, 5.1 mmHg. The mean mitral valve gradient is 2.0 mmHg. Tricuspid Valve: The tricuspid valve is degenerative in appearance. Tricuspid valve regurgitation is severe. Aortic Valve: The aortic valve is calcified. Aortic valve regurgitation is not visualized. Mild aortic valve sclerosis is present, with no evidence of aortic valve stenosis. Aortic valve mean gradient measures 2.0 mmHg. Aortic valve peak gradient measures 3.4 mmHg. Aortic valve area, by VTI measures 1.32 cm. Pulmonic Valve: The pulmonic valve was grossly normal. Pulmonic valve regurgitation is mild to moderate.  Aorta: The aortic root was not well visualized. IAS/Shunts: No atrial level shunt detected by color flow Doppler.  LEFT VENTRICLE PLAX 2D LVIDd:         5.60 cm      Diastology LVIDs:         5.20 cm      LV e' medial:    3.81 cm/s LV PW:         1.10 cm      LV E/e' medial:  22.7 LV IVS:        0.70 cm      LV e' lateral:   8.05 cm/s LVOT diam:     1.80 cm      LV E/e' lateral: 10.7 LV SV:         15 LV SV Index:   8 LVOT Area:     2.54 cm  LV Volumes (MOD) LV vol d, MOD A4C: 184.0 ml LV vol s, MOD A4C: 153.0 ml LV SV MOD A4C:     184.0 ml RIGHT VENTRICLE RV Basal diam:  4.80 cm LEFT ATRIUM           Index       RIGHT ATRIUM           Index LA diam:      5.10 cm 2.76 cm/m  RA Area:     29.90 cm LA Vol (A2C): 41.8 ml 22.65 ml/m RA Volume:   115.00 ml 62.31 ml/m LA Vol (A4C): 68.2 ml 36.95 ml/m  AORTIC VALVE  PULMONIC VALVE AV Area (Vmax):    1.50 cm    PV Vmax:       0.69 m/s AV Area (Vmean):   1.41 cm    PV Vmean:      43.300 cm/s AV Area (VTI):     1.32 cm    PV VTI:        0.070 m AV Vmax:           92.20 cm/s  PV Peak grad:  1.9 mmHg AV Vmean:          63.700 cm/s PV Mean grad:  1.0 mmHg AV VTI:            0.112 m AV Peak Grad:      3.4 mmHg AV Mean Grad:      2.0 mmHg LVOT Vmax:         54.20 cm/s LVOT Vmean:        35.400 cm/s LVOT VTI:          0.058 m LVOT/AV VTI ratio: 0.52  AORTA Ao Root diam: 3.10 cm MITRAL VALVE               TRICUSPID VALVE MV Area (PHT): 5.06 cm    TR Peak grad:   29.2 mmHg MV Area VTI:   1.06 cm    TR Vmax:        270.00 cm/s MV Peak grad:  5.1 mmHg MV Mean grad:  2.0 mmHg    SHUNTS MV Vmax:       1.13 m/s    Systemic VTI:  0.06 m MV Vmean:      67.8 cm/s   Systemic Diam: 1.80 cm MV Decel Time: 150 msec MV E velocity: 86.53 cm/s Neoma Laming MD Electronically signed by Neoma Laming MD Signature Date/Time: 02/19/2020/12:46:01 PM    Final      Medications:   . sodium chloride    .  ceFAZolin (ANCEF) IV 1 g (02/19/20 1711)   . allopurinol  100 mg Oral  Daily  . apixaban  2.5 mg Oral BID  . ascorbic acid  500 mg Oral Daily  . [START ON 02/21/2020] colchicine  0.3 mg Oral Daily  . ferrous sulfate  325 mg Oral Q breakfast  . FLUoxetine  30 mg Oral Daily  . midodrine  10 mg Oral TID WC  . pantoprazole  40 mg Oral BID  . sodium chloride flush  3 mL Intravenous Q12H  . thiothixene  1 mg Oral BID   sodium chloride, albuterol, lip balm, loratadine, nitroGLYCERIN, ondansetron **OR** ondansetron (ZOFRAN) IV, sodium chloride flush  Assessment/ Plan:  Ms. Tamie Geiger is a 65 y.o. 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 Acute kidney injury on chronic kidney disease stage IV with history of proteinuria: baseline creatinine of 3.88, GFR of 12 on 01/29/20.  Chronic Kidney Disease secondary to hypertension Acute kidney injury with uremia - concern for ATN from prerenal azotemia - Monitor kidney function and low threshold for dialysis. Has required dialysis in the past.  - Holding diuretics.   2. Hypotension: blood pressure at goal 114/87 - midodrine  3. Hypokalemia:  - replaced potassium as needed.    LOS: 4  Lynnmarie Lovett 2/12/20224:06 PM

## 2020-02-20 NOTE — Progress Notes (Signed)
Pt has had 2 nosebleeds for the whole day. Day shift Rn's have been aware. Will continue to monitor pt.

## 2020-02-20 NOTE — Progress Notes (Signed)
Throughout the morning, pt has complained of stomach pain. RN has been notified and the patient is aware that she currently is limited to pain medications. Pt continues to have small outbursts in which she yells in pain. Will continue to monitor pt.

## 2020-02-20 NOTE — Progress Notes (Signed)
Siiter at bedside, reported pt started to gag, spit up tiny red blood clots. MD made aware. No new orders at this time.

## 2020-02-20 NOTE — Progress Notes (Signed)
Pt began to cough then began to gag. Pt spit out red clots. RN notified. RN aware of pts not feeling well. Will continue to monitor pt.

## 2020-02-21 ENCOUNTER — Encounter: Payer: Self-pay | Admitting: Internal Medicine

## 2020-02-21 ENCOUNTER — Inpatient Hospital Stay: Payer: Medicaid Other

## 2020-02-21 LAB — RENAL FUNCTION PANEL
Albumin: 2.6 g/dL — ABNORMAL LOW (ref 3.5–5.0)
Anion gap: 22 — ABNORMAL HIGH (ref 5–15)
BUN: 137 mg/dL — ABNORMAL HIGH (ref 8–23)
CO2: 21 mmol/L — ABNORMAL LOW (ref 22–32)
Calcium: 9.5 mg/dL (ref 8.9–10.3)
Chloride: 96 mmol/L — ABNORMAL LOW (ref 98–111)
Creatinine, Ser: 6.67 mg/dL — ABNORMAL HIGH (ref 0.44–1.00)
GFR, Estimated: 6 mL/min — ABNORMAL LOW (ref >60)
Glucose, Bld: 83 mg/dL (ref 70–99)
Phosphorus: 6.3 mg/dL — ABNORMAL HIGH (ref 2.5–4.6)
Potassium: 3.3 mmol/L — ABNORMAL LOW (ref 3.5–5.1)
Sodium: 139 mmol/L (ref 135–145)

## 2020-02-21 LAB — CULTURE, BLOOD (ROUTINE X 2)
Culture: NO GROWTH
Special Requests: ADEQUATE

## 2020-02-21 LAB — CBC
HCT: 33.7 % — ABNORMAL LOW (ref 36.0–46.0)
Hemoglobin: 11.4 g/dL — ABNORMAL LOW (ref 12.0–15.0)
MCH: 22.4 pg — ABNORMAL LOW (ref 26.0–34.0)
MCHC: 33.8 g/dL (ref 30.0–36.0)
MCV: 66.3 fL — ABNORMAL LOW (ref 80.0–100.0)
Platelets: 129 10*3/uL — ABNORMAL LOW (ref 150–400)
RBC: 5.08 MIL/uL (ref 3.87–5.11)
RDW: 22.4 % — ABNORMAL HIGH (ref 11.5–15.5)
WBC: 3.6 10*3/uL — ABNORMAL LOW (ref 4.0–10.5)
nRBC: 6.6 % — ABNORMAL HIGH (ref 0.0–0.2)

## 2020-02-21 LAB — BLOOD GAS, VENOUS
Acid-base deficit: 7.1 mmol/L — ABNORMAL HIGH (ref 0.0–2.0)
Bicarbonate: 19.7 mmol/L — ABNORMAL LOW (ref 20.0–28.0)
O2 Saturation: 50.6 %
Patient temperature: 37
pCO2, Ven: 44 mmHg (ref 44.0–60.0)
pH, Ven: 7.26 (ref 7.250–7.430)
pO2, Ven: 32 mmHg (ref 32.0–45.0)

## 2020-02-21 LAB — PROCALCITONIN: Procalcitonin: 7.76 ng/mL

## 2020-02-21 LAB — GLUCOSE, CAPILLARY: Glucose-Capillary: 134 mg/dL — ABNORMAL HIGH (ref 70–99)

## 2020-02-21 MED ORDER — MILRINONE LACTATE IN DEXTROSE 20-5 MG/100ML-% IV SOLN
0.2500 ug/kg/min | INTRAVENOUS | Status: DC
  Administered 2020-02-21 – 2020-02-22 (×2): 0.25 ug/kg/min via INTRAVENOUS
  Filled 2020-02-21 (×3): qty 100

## 2020-02-21 MED ORDER — FUROSEMIDE 10 MG/ML IJ SOLN
100.0000 mg | Freq: Once | INTRAVENOUS | Status: AC
  Administered 2020-02-21: 100 mg via INTRAVENOUS
  Filled 2020-02-21: qty 10

## 2020-02-21 MED ORDER — CARVEDILOL 6.25 MG PO TABS
3.1250 mg | ORAL_TABLET | Freq: Two times a day (BID) | ORAL | Status: DC
  Administered 2020-02-21 – 2020-02-22 (×2): 3.125 mg via ORAL
  Filled 2020-02-21 (×4): qty 1

## 2020-02-21 MED ORDER — SODIUM BICARBONATE 650 MG PO TABS
650.0000 mg | ORAL_TABLET | Freq: Two times a day (BID) | ORAL | Status: DC
  Administered 2020-02-21 – 2020-02-22 (×2): 650 mg via ORAL
  Filled 2020-02-21 (×2): qty 1

## 2020-02-21 MED ORDER — POTASSIUM CHLORIDE CRYS ER 20 MEQ PO TBCR
40.0000 meq | EXTENDED_RELEASE_TABLET | Freq: Once | ORAL | Status: AC
  Administered 2020-02-21: 40 meq via ORAL
  Filled 2020-02-21: qty 2

## 2020-02-21 NOTE — Progress Notes (Signed)
[facility]  ROUNDING NOTE   Subjective:   Hemoptysis yesterday.   Creatinine 6.67 (6.58) UOP 70m - recorded  Mental status has improved  Objective:  Vital signs in last 24 hours:  Temp:  [97.4 F (36.3 C)] 97.4 F (36.3 C) (02/13 0345) Pulse Rate:  [49-125] 124 (02/13 1114) Resp:  [20-22] 20 (02/13 1114) BP: (90-117)/(61-96) 112/90 (02/13 1114) SpO2:  [93 %-100 %] 100 % (02/13 1114) Weight:  [82.5 kg] 82.5 kg (02/13 0345)  Weight change:  Filed Weights   02/18/20 0300 02/19/20 0100 02/21/20 0345  Weight: 78.1 kg 83.5 kg 82.5 kg    Intake/Output: I/O last 3 completed shifts: In: 200 [P.O.:200] Out: 89 [Urine:85; Stool:4]   Intake/Output this shift:  No intake/output data recorded.  Physical Exam: General: Laying in bed, lethargic  Head: Normocephalic, atraumatic. Moist oral mucosal membranes  Eyes: Anicteric, PERRL  Neck: Supple, trachea midline  Lungs:  Clear to auscultation  Heart: Regular rate and rhythm  Abdomen:  Soft, nontender,   Extremities: no peripheral edema.  Neurologic: Nonfocal, moving all four extremities  Skin: No lesions  Access: none    Basic Metabolic Panel: Recent Labs  Lab 02/17/20 0451 02/18/20 0556 02/19/20 0406 02/20/20 0403 02/21/20 0413  NA 140 139 140 141 139  K 4.2 3.7 3.5 3.3* 3.3*  CL 102 97* 97* 95* 96*  CO2 19* 22 23 21* 21*  GLUCOSE 103* 108* 102* 72 83  BUN 147* 141* 131* 127* 137*  CREATININE 8.13* 7.35* 6.67* 6.58* 6.67*  CALCIUM 8.8* 9.1 9.3 9.6 9.5  MG 2.3  --   --   --   --   PHOS  --   --  5.8* 6.1* 6.3*    Liver Function Tests: Recent Labs  Lab 02/16/20 1326 02/18/20 0556 02/19/20 0406 02/20/20 0403 02/21/20 0413  AST 191* 121*  --  83*  --   ALT 80* 61*  --  47*  --   ALKPHOS 158* 131*  --  138*  --   BILITOT 2.6* 2.5*  --  2.6*  --   PROT 7.3 6.6  --  6.6  --   ALBUMIN 3.0* 2.6* 2.7* 2.6*  2.5* 2.6*   No results for input(s): LIPASE, AMYLASE in the last 168 hours. Recent  Labs  Lab 02/16/20 1508 02/20/20 0403  AMMONIA 25 28    CBC: Recent Labs  Lab 02/16/20 1326 02/17/20 0451 02/18/20 0556 02/19/20 0406 02/20/20 0403 02/21/20 0413  WBC 10.2 8.4 6.3 4.8 3.9* 3.6*  NEUTROABS 8.6*  --   --   --   --   --   HGB 11.1* 9.9* 10.6* 10.9* 11.4* 11.4*  HCT 34.7* 30.4* 32.4* 33.7* 35.3* 33.7*  MCV 68.2* 67.3* 67.1* 68.1* 68.3* 66.3*  PLT 158 137* 124* 120* 122* 129*    Cardiac Enzymes: Recent Labs  Lab 02/16/20 1326 02/17/20 0451 02/18/20 0556  CKTOTAL 1,180* 609* 447*    BNP: Invalid input(s): POCBNP  CBG: No results for input(s): GLUCAP in the last 168 hours.  Microbiology: Results for orders placed or performed during the hospital encounter of 02/16/20  Culture, blood (routine x 2)     Status: Abnormal   Collection Time: 02/16/20  2:28 PM   Specimen: BLOOD  Result Value Ref Range Status   Specimen Description   Final    BLOOD BLOOD LEFT HAND [facility]formed at [facility] [address], [place] [address]   Special Requests  Final    BOTTLES DRAWN AEROBIC AND ANAEROBIC Blood Culture adequate volume Performed at [facility], [address] [place], [place] [address]    Culture  Setup Time   Final    GRAM POSITIVE COCCI AEROBIC BOTTLE ONLY CRITICAL RESULT CALLED TO, READ BACK BY AND VERIFIED WITH: NATHAN BELUE PHARMD [id number] 02/17/20 HNM    Culture (A)  Final    STREPTOCOCCUS MITIS/ORALIS STAPHYLOCOCCUS EPIDERMIDIS THE SIGNIFICANCE OF ISOLATING THIS ORGANISM FROM A SINGLE SET OF BLOOD CULTURES WHEN MULTIPLE SETS ARE DRAWN IS UNCERTAIN. PLEASE NOTIFY THE MICROBIOLOGY DEPARTMENT WITHIN ONE WEEK IF SPECIATION AND SENSITIVITIES ARE REQUIRED. Performed at [facility], [place] [address], [place], [place] [address]    Report Status 02/19/2020 FINAL  Final  Blood Culture ID Panel (Reflexed)     Status: Abnormal   Collection Time: 02/16/20  2:28 PM  Result Value Ref Range Status   Enterococcus faecalis NOT  DETECTED NOT DETECTED Final   Enterococcus Faecium NOT DETECTED NOT DETECTED Final   Listeria monocytogenes NOT DETECTED NOT DETECTED Final   Staphylococcus species DETECTED (A) NOT DETECTED Final    Comment: CRITICAL RESULT CALLED TO, READ BACK BY AND VERIFIED WITHLloyd Huger PHARMD [id number] 02/17/20 HNM    Staphylococcus aureus (BCID) NOT DETECTED NOT DETECTED Final   Staphylococcus epidermidis DETECTED (A) NOT DETECTED Final    Comment: Methicillin (oxacillin) resistant coagulase negative staphylococcus. Possible blood culture contaminant (unless isolated from more than one blood culture draw or clinical case suggests pathogenicity). No antibiotic treatment is indicated for blood  culture contaminants. CRITICAL RESULT CALLED TO, READ BACK BY AND VERIFIED WITH: Lloyd Huger PHARMD [id number] 02/17/20 HNM    Staphylococcus lugdunensis NOT DETECTED NOT DETECTED Final   Streptococcus species DETECTED (A) NOT DETECTED Final    Comment: Not Enterococcus species, Streptococcus agalactiae, Streptococcus pyogenes, or Streptococcus pneumoniae. CRITICAL RESULT CALLED TO, READ BACK BY AND VERIFIED WITH: NATHAN BELUE PHARMD [id number] 02/17/20 HNM    Streptococcus agalactiae NOT DETECTED NOT DETECTED Final   Streptococcus pneumoniae NOT DETECTED NOT DETECTED Final   Streptococcus pyogenes NOT DETECTED NOT DETECTED Final   A.calcoaceticus-baumannii NOT DETECTED NOT DETECTED Final   Bacteroides fragilis NOT DETECTED NOT DETECTED Final   Enterobacterales NOT DETECTED NOT DETECTED Final   Enterobacter cloacae complex NOT DETECTED NOT DETECTED Final   Escherichia coli NOT DETECTED NOT DETECTED Final   Klebsiella aerogenes NOT DETECTED NOT DETECTED Final   Klebsiella oxytoca NOT DETECTED NOT DETECTED Final   Klebsiella pneumoniae NOT DETECTED NOT DETECTED Final   Proteus species NOT DETECTED NOT DETECTED Final   Salmonella species NOT DETECTED NOT DETECTED Final   Serratia marcescens NOT DETECTED NOT DETECTED Final    Haemophilus influenzae NOT DETECTED NOT DETECTED Final   Neisseria meningitidis NOT DETECTED NOT DETECTED Final   Pseudomonas aeruginosa NOT DETECTED NOT DETECTED Final   Stenotrophomonas maltophilia NOT DETECTED NOT DETECTED Final   Candida albicans NOT DETECTED NOT DETECTED Final   Candida auris NOT DETECTED NOT DETECTED Final   Candida glabrata NOT DETECTED NOT DETECTED Final   Candida krusei NOT DETECTED NOT DETECTED Final   Candida parapsilosis NOT DETECTED NOT DETECTED Final   Candida tropicalis NOT DETECTED NOT DETECTED Final   Cryptococcus neoformans/gattii NOT DETECTED NOT DETECTED Final   Methicillin resistance mecA/C DETECTED (A) NOT DETECTED Final    Comment: CRITICAL RESULT CALLED TO, READ BACK BY AND VERIFIED WITHLloyd Huger PHARMD [id number] 02/17/20 HNM Performed at [facility], [address] [place], [place] [address]   Culture, blood (routine x 2)     Status: None   Collection Time: 02/16/20  2:38 PM   Specimen: BLOOD  Result Value Ref Range Status   Specimen Description BLOOD RIGHT ANTECUBITAL  Final   Special Requests   Final    BOTTLES DRAWN AEROBIC AND ANAEROBIC Blood Culture adequate volume   Culture   Final    NO GROWTH 5 DAYS Performed at [facility], [address] [place], [place] [address]    Report Status 02/21/2020 FINAL  Final  Resp Panel by RT-PCR (Flu A&B, Covid) Nasopharyngeal Swab     Status: None   Collection Time: 02/16/20  2:52 PM   Specimen: Nasopharyngeal Swab; Nasopharyngeal(NP) swabs in vial transport medium  Result Value Ref Range Status   SARS Coronavirus 2 by RT PCR NEGATIVE NEGATIVE Final    Comment: (NOTE) SARS-CoV-2 target nucleic acids are NOT DETECTED.  The SARS-CoV-2 RNA is generally detectable in upper respiratory specimens during the acute phase of infection. The lowest concentration of SARS-CoV-2 viral copies this assay can detect is 138 copies/mL. A negative result does not preclude SARS-Cov-2 infection  and should not be used as the sole basis for treatment or other patient management decisions. A negative result may occur with  improper specimen collection/handling, submission of specimen other than nasopharyngeal swab, presence of viral mutation(s) within the areas targeted by this assay, and inadequate number of viral copies(<138 copies/mL). A negative result must be combined with clinical observations, patient history, and epidemiological information. The expected result is Negative.  Fact Sheet for Patients:  EntrepreneurPulse.com.au  Fact Sheet for Healthcare Providers:  IncredibleEmployment.be  This test is no t yet approved or cleared by the [place] FDA and  has been authorized for detection and/or diagnosis of SARS-CoV-2 by FDA under an Emergency Use Authorization (EUA). This EUA will remain  in effect (meaning this test can be used) for the duration of the COVID-19 declaration under Section 564(b)(1) of the Act, 21 U.S.C.section 360bbb-3(b)(1), unless the authorization is terminated  or revoked sooner.       Influenza A by PCR NEGATIVE NEGATIVE Final   Influenza B by PCR NEGATIVE NEGATIVE Final    Comment: (NOTE) The Xpert Xpress SARS-CoV-2/FLU/RSV plus assay is intended as an aid in the diagnosis of influenza from Nasopharyngeal swab specimens and should not be used as a sole basis for treatment. Nasal washings and aspirates are unacceptable for Xpert Xpress SARS-CoV-2/FLU/RSV testing.  Fact Sheet for Patients: EntrepreneurPulse.com.au  Fact Sheet for Healthcare Providers: IncredibleEmployment.be  This test is not yet approved or cleared by the [place] FDA and has been authorized for detection and/or diagnosis of SARS-CoV-2 by FDA under an Emergency Use Authorization (EUA). This EUA will remain in effect (meaning this test can be used) for the duration of the COVID-19 declaration  under Section 564(b)(1) of the Act, 21 U.S.C. section 360bbb-3(b)(1), unless the authorization is terminated or revoked.  Performed at [facility], [address] [place], [place] [address]   CULTURE, BLOOD (ROUTINE X 2) w Reflex to ID Panel     Status: None (Preliminary result)   Collection Time: 02/17/20 12:19 PM   Specimen: BLOOD  Result Value Ref Range Status   Specimen Description BLOOD BLOOD RIGHT HAND  Final   Special Requests   Final    BOTTLES DRAWN AEROBIC AND ANAEROBIC Blood Culture adequate volume   Culture   Final    NO GROWTH 4  DAYS Performed at [facility], [address] [place], [place] [address]    Report Status PENDING  Incomplete  CULTURE, BLOOD (ROUTINE X 2) w Reflex to ID Panel     Status: None (Preliminary result)   Collection Time: 02/17/20 12:22 PM   Specimen: BLOOD  Result Value Ref Range Status   Specimen Description BLOOD RIGHT ANTECUBITAL  Final   Special Requests   Final    BOTTLES DRAWN AEROBIC AND ANAEROBIC Blood Culture adequate volume   Culture   Final    NO GROWTH 4 DAYS Performed at [facility], [address] [place], [place] [address]    Report Status PENDING  Incomplete  C Difficile Quick Screen w PCR reflex     Status: None   Collection Time: 02/18/20  9:19 AM   Specimen: STOOL  Result Value Ref Range Status   C Diff antigen NEGATIVE NEGATIVE Final   C Diff toxin NEGATIVE NEGATIVE Final   C Diff interpretation No C. difficile detected.  Final    Comment: Performed at [facility], [address] [place], [place] [address]  Urine Culture     Status: Abnormal   Collection Time: 02/18/20  1:12 PM   Specimen: Urine, Random  Result Value Ref Range Status   Specimen Description   Final    URINE, RANDOM Performed at [facility], [address], [place], [place] [address]    Special Requests   Final    NONE Performed at [facility], [address] [place], [place]  [address]    Culture MULTIPLE SPECIES PRESENT, SUGGEST RECOLLECTION (A)  Final   Report Status 02/20/2020 FINAL  Final    Coagulation Studies: Recent Labs    02/20/20 0403  LABPROT 32.4*  INR 3.3*    Urinalysis: No results for input(s): COLORURINE, LABSPEC, PHURINE, GLUCOSEU, HGBUR, BILIRUBINUR, KETONESUR, PROTEINUR, UROBILINOGEN, NITRITE, LEUKOCYTESUR in the last 72 hours.  Invalid input(s): APPERANCEUR    Imaging: No results found.   Medications:   . sodium chloride    .  ceFAZolin (ANCEF) IV 1 g (02/20/20 1708)   . allopurinol  100 mg Oral Daily  . apixaban  2.5 mg Oral BID  . ascorbic acid  500 mg Oral Daily  . carvedilol  3.125 mg Oral BID WC  . colchicine  0.3 mg Oral Daily  . ferrous sulfate  325 mg Oral Q breakfast  . FLUoxetine  30 mg Oral Daily  . midodrine  10 mg Oral TID WC  . pantoprazole  40 mg Oral BID  . sodium bicarbonate  650 mg Oral BID  . sodium chloride flush  3 mL Intravenous Q12H  . thiothixene  1 mg Oral BID   sodium chloride, albuterol, lip balm, loratadine, nitroGLYCERIN, ondansetron **OR** ondansetron (ZOFRAN) IV, sodium chloride flush  Assessment/ Plan:  Ms. Maiah Bratz is a 65 y.o. 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 Acute kidney injury on chronic kidney disease stage IV with history of proteinuria: baseline creatinine of 3.88, GFR of 12 on 01/29/20.  Chronic Kidney Disease secondary to hypertension Acute kidney injury with uremia - concern for ATN from prerenal azotemia - Monitor kidney function and low threshold  for dialysis. Has required  dialysis in the past.  - Holding diuretics.  - Check ANCA and anti-GBM - mental status changes can be from uremic encephalopathy  2. Hypotension: blood pressure at goal but with tachycardia - midodrine - starting carvedilol today  3. Hypokalemia:  - replaced potassium as needed.    LOS: 5 Deniel Mcquiston 2/13/20222:28 PM

## 2020-02-21 NOTE — Progress Notes (Signed)
Mobility Specialist - Progress Note   02/21/20 1600  Mobility  Activity Ambulated in hall  Level of Assistance Minimal assist, patient does 75% or more  Assistive Device Front wheel walker  Distance Ambulated (ft) 30 ft  Mobility Response Tolerated well  Mobility performed by Mobility specialist  $Mobility charge 1 Mobility    Pre-mobility: 121 HR, 98% SpO2 During mobility: 134 HR, 100% SpO2 Post-mobility: 120 HR, 94% SpO2   Pt was lying in bed upon arrival with sitter present. Pt agreed to session. Noted wheezing sounds upon arrival. Pt c/o SOB, O2 sat at 98%. Pt sat EOB with minA. No dizziness upon sitting. Pt stood to RW and ambulated 30' total in room/hallway with minA for occasional steering on turns. No LOB noted. VC needed to keep RW close to body. After ~15', pt voiced need to sit and a chair was provided. HR 134 bpm at time of rest. O2 difficult to determine during activity d/t poor pleth but appears to be in high 90s. Pt voices SOB, PLB utilized, no s/s of distress. Pt able to ambulate back to room. Pt returned to supine with minA for LE support. MaxHR to be 139 bpm once supine, but does return to 120 bpm prior to exit. Further mobility limited d/t fatigue. Pt rated RPE this session 10/10. Overall, pt tolerated session well. Pt was left in bed with all needs in reach and sitter present. Nurse notified.   Kathee Delton Mobility Specialist 02/21/20, 4:38 PM
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
## 2020-02-22 LAB — CULTURE, BLOOD (ROUTINE X 2)
Culture: NO GROWTH
Culture: NO GROWTH
Special Requests: ADEQUATE
Special Requests: ADEQUATE

## 2020-02-22 LAB — APTT: aPTT: 53 seconds — ABNORMAL HIGH (ref 24–36)

## 2020-02-22 LAB — CBC
HCT: 31.4 % — ABNORMAL LOW (ref 36.0–46.0)
Hemoglobin: 10.3 g/dL — ABNORMAL LOW (ref 12.0–15.0)
MCH: 22.3 pg — ABNORMAL LOW (ref 26.0–34.0)
MCHC: 32.8 g/dL (ref 30.0–36.0)
MCV: 68.1 fL — ABNORMAL LOW (ref 80.0–100.0)
Platelets: 121 10*3/uL — ABNORMAL LOW (ref 150–400)
RBC: 4.61 MIL/uL (ref 3.87–5.11)
RDW: 22.4 % — ABNORMAL HIGH (ref 11.5–15.5)
WBC: 4.3 10*3/uL (ref 4.0–10.5)
nRBC: 4.2 % — ABNORMAL HIGH (ref 0.0–0.2)

## 2020-02-22 LAB — COMPREHENSIVE METABOLIC PANEL
ALT: 27 U/L (ref 0–44)
AST: 74 U/L — ABNORMAL HIGH (ref 15–41)
Albumin: 2.8 g/dL — ABNORMAL LOW (ref 3.5–5.0)
Alkaline Phosphatase: 145 U/L — ABNORMAL HIGH (ref 38–126)
Anion gap: 19 — ABNORMAL HIGH (ref 5–15)
BUN: 145 mg/dL — ABNORMAL HIGH (ref 8–23)
CO2: 24 mmol/L (ref 22–32)
Calcium: 9.5 mg/dL (ref 8.9–10.3)
Chloride: 97 mmol/L — ABNORMAL LOW (ref 98–111)
Creatinine, Ser: 7.05 mg/dL — ABNORMAL HIGH (ref 0.44–1.00)
GFR, Estimated: 6 mL/min — ABNORMAL LOW (ref >60)
Glucose, Bld: 127 mg/dL — ABNORMAL HIGH (ref 70–99)
Potassium: 3.5 mmol/L (ref 3.5–5.1)
Sodium: 140 mmol/L (ref 135–145)
Total Bilirubin: 2.4 mg/dL — ABNORMAL HIGH (ref 0.3–1.2)
Total Protein: 6.6 g/dL (ref 6.5–8.1)

## 2020-02-22 LAB — HEPATITIS B SURFACE ANTIBODY,QUALITATIVE: Hep B S Ab: REACTIVE — AB

## 2020-02-22 LAB — MPO/PR-3 (ANCA) ANTIBODIES
ANCA Proteinase 3: <3.5 U/mL (ref 0.0–3.5)
Myeloperoxidase Abs: <9 U/mL (ref 0.0–9.0)

## 2020-02-22 LAB — PROTIME-INR
INR: 3.9 — ABNORMAL HIGH (ref 0.8–1.2)
Prothrombin Time: 37.3 seconds — ABNORMAL HIGH (ref 11.4–15.2)

## 2020-02-22 LAB — GLUCOSE, CAPILLARY
Glucose-Capillary: 101 mg/dL — ABNORMAL HIGH (ref 70–99)
Glucose-Capillary: 113 mg/dL — ABNORMAL HIGH (ref 70–99)
Glucose-Capillary: 118 mg/dL — ABNORMAL HIGH (ref 70–99)
Glucose-Capillary: 120 mg/dL — ABNORMAL HIGH (ref 70–99)
Glucose-Capillary: 98 mg/dL (ref 70–99)

## 2020-02-22 LAB — HEPATITIS B CORE ANTIBODY, IGM: Hep B C IgM: NONREACTIVE

## 2020-02-22 LAB — MRSA PCR SCREENING: MRSA by PCR: POSITIVE — AB

## 2020-02-22 LAB — PROCALCITONIN: Procalcitonin: 8.64 ng/mL

## 2020-02-22 LAB — HEPATITIS B SURFACE ANTIGEN: Hepatitis B Surface Ag: NONREACTIVE

## 2020-02-22 LAB — HEPATITIS C ANTIBODY: HCV Ab: NONREACTIVE

## 2020-02-22 MED ORDER — SODIUM BICARBONATE 650 MG PO TABS
1300.0000 mg | ORAL_TABLET | Freq: Two times a day (BID) | ORAL | Status: DC
  Administered 2020-02-22 – 2020-02-23 (×4): 1300 mg via ORAL
  Filled 2020-02-22 (×6): qty 2

## 2020-02-22 MED ORDER — CHLORHEXIDINE GLUCONATE CLOTH 2 % EX PADS
6.0000 | MEDICATED_PAD | Freq: Every day | CUTANEOUS | Status: DC
  Administered 2020-02-22 – 2020-02-23 (×2): 6 via TOPICAL

## 2020-02-22 MED ORDER — BUMETANIDE 0.25 MG/ML IJ SOLN
1.0000 mg | Freq: Once | INTRAMUSCULAR | Status: AC
  Administered 2020-02-22: 1 mg via INTRAVENOUS
  Filled 2020-02-22: qty 4

## 2020-02-22 MED ORDER — ALBUMIN HUMAN 25 % IV SOLN
25.0000 g | Freq: Once | INTRAVENOUS | Status: AC
  Administered 2020-02-22: 25 g via INTRAVENOUS
  Filled 2020-02-22: qty 100

## 2020-02-22 MED ORDER — MUPIROCIN 2 % EX OINT
1.0000 "application " | TOPICAL_OINTMENT | Freq: Two times a day (BID) | CUTANEOUS | Status: DC
  Administered 2020-02-22 – 2020-02-23 (×5): 1 via NASAL
  Filled 2020-02-22: qty 22

## 2020-02-22 NOTE — Progress Notes (Signed)
PT Cancellation Note  Patient Details Name: Katelyn Lane MRN: [id number] DOB: 09/15/1955   Cancelled Treatment:    Reason Eval/Treat Not Completed: Medical issues which prohibited therapy (Consult received and chart reviewed. Patient noted with recent transfer to CCU due to decline in respiratory status. Per guidelines, will require new orders to resume PT services.  Please re-consult as medically appropriate.)  Kierstynn Babich H. Owens Shark, PT, DPT, NCS 02/22/20, 8:32 AM [phone]
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
## 2020-02-22 NOTE — Progress Notes (Signed)
OT Cancellation Note  Patient Details Name: Katelyn Lane MRN: [id number] DOB: [birth year]   Cancelled Treatment:    Reason Eval/Treat Not Completed: Medical issues which prohibited therapy. Pt with transfer this morning to CCU secondary to declined in status. Per therapy guidelines, OT to complete order and will await new orders when pt is medically appropriate for OT intervention.   Darleen Crocker, [place], OTR/L , CBIS ascom [phone]  02/22/20, 8:47 AM   02/22/2020, 8:46 AM

## 2020-02-22 NOTE — Progress Notes (Signed)
[facility]  ROUNDING NOTE   Subjective:   Moved to step down due to hypotension and acute respiratory distress. Placed on milrinone. IV furosemide. UOP 107m  Sitter at bedside.   Creatinine 7.05 (6.67)  Objective:  Vital signs in last 24 hours:  Temp:  [94 F (34.4 C)-97.9 F (36.6 C)] 97.9 F (36.6 C) (02/14 0800) Pulse Rate:  [29-126] 125 (02/14 1100) Resp:  [14-44] 25 (02/14 1100) BP: (82-138)/(62-93) 95/67 (02/14 1100) SpO2:  [62 %-100 %] 96 % (02/14 1100) Weight:  [79.5 kg] 79.5 kg (02/13 2216)  Weight change: -3.007 kg Filed Weights   02/19/20 0100 02/21/20 0345 02/21/20 2216  Weight: 83.5 kg 82.5 kg 79.5 kg    Intake/Output: I/O last 3 completed shifts: In: 716.4 [P.O.:480; I.V.:52.1; IV Piggyback:184.3] Out: 134 [Urine:130; Stool:4]   Intake/Output this shift:  Total I/O In: 76.3 [I.V.:26.3; IV Piggyback:50] Out: -   Physical Exam: General: Laying in bed, lethargic  Head: +nasal packing  Eyes: Anicteric, PERRL  Neck: Supple, trachea midline  Lungs:  Bilateral crackles  Heart: Regular rate and rhythm  Abdomen:  Soft, nontender,   Extremities: + peripheral edema.  Neurologic: Alert to self only  Skin: No lesions  Access: none    Basic Metabolic Panel: Recent Labs  Lab 02/17/20 0451 02/18/20 0556 02/19/20 0406 02/20/20 0403 02/21/20 0413 02/22/20 0355  NA 140 139 140 141 139 140  K 4.2 3.7 3.5 3.3* 3.3* 3.5  CL 102 97* 97* 95* 96* 97*  CO2 19* 22 23 21* 21* 24  GLUCOSE 103* 108* 102* 72 83 127*  BUN 147* 141* 131* 127* 137* 145*  CREATININE 8.13* 7.35* 6.67* 6.58* 6.67* 7.05*  CALCIUM 8.8* 9.1 9.3 9.6 9.5 9.5  MG 2.3  --   --   --   --   --   PHOS  --   --  5.8* 6.1* 6.3*  --     Liver Function Tests: Recent Labs  Lab 02/16/20 1326 02/18/20 0556 02/19/20 0406 02/20/20 0403 02/21/20 0413 02/22/20 0355  AST 191* 121*  --  83*  --  74*  ALT 80* 61*  --  47*  --  27  ALKPHOS 158* 131*  --  138*  --  145*  BILITOT  2.6* 2.5*  --  2.6*  --  2.4*  PROT 7.3 6.6  --  6.6  --  6.6  ALBUMIN 3.0* 2.6* 2.7* 2.6*  2.5* 2.6* 2.8*   No results for input(s): LIPASE, AMYLASE in the last 168 hours. Recent Labs  Lab 02/16/20 1508 02/20/20 0403  AMMONIA 25 28    CBC: Recent Labs  Lab 02/16/20 1326 02/17/20 0451 02/18/20 0556 02/19/20 0406 02/20/20 0403 02/21/20 0413 02/22/20 0355  WBC 10.2   < > 6.3 4.8 3.9* 3.6* 4.3  NEUTROABS 8.6*  --   --   --   --   --   --   HGB 11.1*   < > 10.6* 10.9* 11.4* 11.4* 10.3*  HCT 34.7*   < > 32.4* 33.7* 35.3* 33.7* 31.4*  MCV 68.2*   < > 67.1* 68.1* 68.3* 66.3* 68.1*  PLT 158   < > 124* 120* 122* 129* 121*   < > = values in this interval not displayed.    Cardiac Enzymes: Recent Labs  Lab 02/16/20 1326 02/17/20 0451 02/18/20 0556  CKTOTAL 1,180* 609* 447*    BNP: Invalid input(s): POCBNP  CBG: Recent Labs  Lab 02/21/20 2053 02/22/20 0728 02/22/20  [place]    Microbiology: Results for orders placed or performed during the hospital encounter of 02/16/20  Culture, blood (routine x 2)     Status: Abnormal   Collection Time: 02/16/20  2:28 PM   Specimen: BLOOD  Result Value Ref Range Status   Specimen Description   Final    BLOOD BLOOD LEFT HAND Performed at [facility] [address] [place], [place] [address]    Special Requests   Final    BOTTLES DRAWN AEROBIC AND ANAEROBIC Blood Culture adequate volume Performed at [facility], [address] [place], [place] [address]    Culture  Setup Time   Final    GRAM POSITIVE COCCI AEROBIC BOTTLE ONLY CRITICAL RESULT CALLED TO, READ BACK BY AND VERIFIED WITH: NATHAN BELUE PHARMD [id number] 02/17/20 HNM    Culture (A)  Final    STREPTOCOCCUS MITIS/ORALIS STAPHYLOCOCCUS EPIDERMIDIS THE SIGNIFICANCE OF ISOLATING THIS ORGANISM FROM A SINGLE SET OF BLOOD CULTURES WHEN MULTIPLE SETS ARE DRAWN IS UNCERTAIN. PLEASE NOTIFY THE MICROBIOLOGY DEPARTMENT WITHIN ONE WEEK IF  SPECIATION AND SENSITIVITIES ARE REQUIRED. Performed at [facility], [place] [address] [place],  [address]    Report Status 02/19/2020 FINAL  Final  Blood Culture ID Panel (Reflexed)     Status: Abnormal   Collection Time: 02/16/20  2:28 PM  Result Value Ref Range Status   Enterococcus faecalis NOT DETECTED NOT DETECTED Final   Enterococcus Faecium NOT DETECTED NOT DETECTED Final   Listeria monocytogenes NOT DETECTED NOT DETECTED Final   Staphylococcus species DETECTED (A) NOT DETECTED Final    Comment: CRITICAL RESULT CALLED TO, READ BACK BY AND VERIFIED WITHLloyd Huger PHARMD [id number] 02/17/20 HNM    Staphylococcus aureus (BCID) NOT DETECTED NOT DETECTED Final   Staphylococcus epidermidis DETECTED (A) NOT DETECTED Final    Comment: Methicillin (oxacillin) resistant coagulase negative staphylococcus. Possible blood culture contaminant (unless isolated from more than one blood culture draw or clinical case suggests pathogenicity). No antibiotic treatment is indicated for blood  culture contaminants. CRITICAL RESULT CALLED TO, READ BACK BY AND VERIFIED WITH: Lloyd Huger PHARMD [id number] 02/17/20 HNM    Staphylococcus lugdunensis NOT DETECTED NOT DETECTED Final   Streptococcus species DETECTED (A) NOT DETECTED Final    Comment: Not Enterococcus species, Streptococcus agalactiae, Streptococcus pyogenes, or Streptococcus pneumoniae. CRITICAL RESULT CALLED TO, READ BACK BY AND VERIFIED WITH: NATHAN BELUE PHARMD [id number] 02/17/20 HNM    Streptococcus agalactiae NOT DETECTED NOT DETECTED Final   Streptococcus pneumoniae NOT DETECTED NOT DETECTED Final   Streptococcus pyogenes NOT DETECTED NOT DETECTED Final   A.calcoaceticus-baumannii NOT DETECTED NOT DETECTED Final   Bacteroides fragilis NOT DETECTED NOT DETECTED Final   Enterobacterales NOT DETECTED NOT DETECTED Final   Enterobacter cloacae complex NOT DETECTED NOT DETECTED Final   Escherichia coli NOT DETECTED NOT DETECTED Final    Klebsiella aerogenes NOT DETECTED NOT DETECTED Final   Klebsiella oxytoca NOT DETECTED NOT DETECTED Final   Klebsiella pneumoniae NOT DETECTED NOT DETECTED Final   Proteus species NOT DETECTED NOT DETECTED Final   Salmonella species NOT DETECTED NOT DETECTED Final   Serratia marcescens NOT DETECTED NOT DETECTED Final   Haemophilus influenzae NOT DETECTED NOT DETECTED Final   Neisseria meningitidis NOT DETECTED NOT DETECTED Final   Pseudomonas aeruginosa NOT DETECTED NOT DETECTED Final   Stenotrophomonas maltophilia NOT DETECTED NOT DETECTED Final   Candida albicans NOT DETECTED NOT DETECTED Final   Candida auris NOT DETECTED NOT  DETECTED Final   Candida glabrata NOT DETECTED NOT DETECTED Final   Candida krusei NOT DETECTED NOT DETECTED Final   Candida parapsilosis NOT DETECTED NOT DETECTED Final   Candida tropicalis NOT DETECTED NOT DETECTED Final   Cryptococcus neoformans/gattii NOT DETECTED NOT DETECTED Final   Methicillin resistance mecA/C DETECTED (A) NOT DETECTED Final    Comment: CRITICAL RESULT CALLED TO, READ BACK BY AND VERIFIED WITHLloyd Huger PHARMD [id number] 02/17/20 HNM Performed at [facility], [address] [place], [place] [address]   Culture, blood (routine x 2)     Status: None   Collection Time: 02/16/20  2:38 PM   Specimen: BLOOD  Result Value Ref Range Status   Specimen Description BLOOD RIGHT ANTECUBITAL  Final   Special Requests   Final    BOTTLES DRAWN AEROBIC AND ANAEROBIC Blood Culture adequate volume   Culture   Final    NO GROWTH 5 DAYS Performed at [facility], [address] [place], [place] [address]    Report Status 02/21/2020 FINAL  Final  Resp Panel by RT-PCR (Flu A&B, Covid) Nasopharyngeal Swab     Status: None   Collection Time: 02/16/20  2:52 PM   Specimen: Nasopharyngeal Swab; Nasopharyngeal(NP) swabs in vial transport medium  Result Value Ref Range Status   SARS Coronavirus 2 by RT PCR NEGATIVE NEGATIVE Final     Comment: (NOTE) SARS-CoV-2 target nucleic acids are NOT DETECTED.  The SARS-CoV-2 RNA is generally detectable in upper respiratory specimens during the acute phase of infection. The lowest concentration of SARS-CoV-2 viral copies this assay can detect is 138 copies/mL. A negative result does not preclude SARS-Cov-2 infection and should not be used as the sole basis for treatment or other patient management decisions. A negative result may occur with  improper specimen collection/handling, submission of specimen other than nasopharyngeal swab, presence of viral mutation(s) within the areas targeted by this assay, and inadequate number of viral copies(<138 copies/mL). A negative result must be combined with clinical observations, patient history, and epidemiological information. The expected result is Negative.  Fact Sheet for Patients:  EntrepreneurPulse.com.au  Fact Sheet for Healthcare Providers:  IncredibleEmployment.be  This test is no t yet approved or cleared by the [place] FDA and  has been authorized for detection and/or diagnosis of SARS-CoV-2 by FDA under an Emergency Use Authorization (EUA). This EUA will remain  in effect (meaning this test can be used) for the duration of the COVID-19 declaration under Section 564(b)(1) of the Act, 21 U.S.C.section 360bbb-3(b)(1), unless the authorization is terminated  or revoked sooner.       Influenza A by PCR NEGATIVE NEGATIVE Final   Influenza B by PCR NEGATIVE NEGATIVE Final    Comment: (NOTE) The Xpert Xpress SARS-CoV-2/FLU/RSV plus assay is intended as an aid in the diagnosis of influenza from Nasopharyngeal swab specimens and should not be used as a sole basis for treatment. Nasal washings and aspirates are unacceptable for Xpert Xpress SARS-CoV-2/FLU/RSV testing.  Fact Sheet for Patients: EntrepreneurPulse.com.au  Fact Sheet for Healthcare  Providers: IncredibleEmployment.be  This test is not yet approved or cleared by the [place] FDA and has been authorized for detection and/or diagnosis of SARS-CoV-2 by FDA under an Emergency Use Authorization (EUA). This EUA will remain in effect (meaning this test can be used) for the duration of the COVID-19 declaration under Section 564(b)(1) of the Act, 21 U.S.C. section 360bbb-3(b)(1), unless the authorization is terminated or revoked.  Performed at [facility],  [place], [place] [address]   CULTURE, BLOOD (ROUTINE X 2) w Reflex to ID Panel     Status: None   Collection Time: 02/17/20 12:19 PM   Specimen: BLOOD  Result Value Ref Range Status   Specimen Description BLOOD BLOOD RIGHT HAND  Final   Special Requests   Final    BOTTLES DRAWN AEROBIC AND ANAEROBIC Blood Culture adequate volume   Culture   Final    NO GROWTH 5 DAYS Performed at [facility], [address] [place], [place] [address]    Report Status 02/22/2020 FINAL  Final  CULTURE, BLOOD (ROUTINE X 2) w Reflex to ID Panel     Status: None   Collection Time: 02/17/20 12:22 PM   Specimen: BLOOD  Result Value Ref Range Status   Specimen Description BLOOD RIGHT ANTECUBITAL  Final   Special Requests   Final    BOTTLES DRAWN AEROBIC AND ANAEROBIC Blood Culture adequate volume   Culture   Final    NO GROWTH 5 DAYS Performed at [facility], [address] [place], [place] [address]    Report Status 02/22/2020 FINAL  Final  C Difficile Quick Screen w PCR reflex     Status: None   Collection Time: 02/18/20  9:19 AM   Specimen: STOOL  Result Value Ref Range Status   C Diff antigen NEGATIVE NEGATIVE Final   C Diff toxin NEGATIVE NEGATIVE Final   C Diff interpretation No C. difficile detected.  Final    Comment: Performed at [facility], [address] [place], [place] [address]  Urine Culture     Status: Abnormal   Collection Time:  02/18/20  1:12 PM   Specimen: Urine, Random  Result Value Ref Range Status   Specimen Description   Final    URINE, RANDOM Performed at [facility], [address], [place], [place] [address]    Special Requests   Final    NONE Performed at [facility], [address] [place], [place] [address]    Culture MULTIPLE SPECIES PRESENT, SUGGEST RECOLLECTION (A)  Final   Report Status 02/20/2020 FINAL  Final  MRSA PCR Screening     Status: Abnormal   Collection Time: 02/21/20 10:33 PM   Specimen: Nasopharyngeal  Result Value Ref Range Status   MRSA by PCR POSITIVE (A) NEGATIVE Final    Comment:        The GeneXpert MRSA Assay (FDA approved for NASAL specimens only), is one component of a comprehensive MRSA colonization surveillance program. It is not intended to diagnose MRSA infection nor to guide or monitor treatment for MRSA infections. RESULT CALLED TO, READ BACK BY AND VERIFIED WITH: MELISSA COBB 02/22/2020 0029 JG Performed at [facility], [address] [place],  [address]     Coagulation Studies: Recent Labs    02/20/20 0403 02/22/20 0355  LABPROT 32.4* 37.3*  INR 3.3* 3.9*    Urinalysis: No results for input(s): COLORURINE, LABSPEC, PHURINE, GLUCOSEU, HGBUR, BILIRUBINUR, KETONESUR, PROTEINUR, UROBILINOGEN, NITRITE, LEUKOCYTESUR in the last 72 hours.  Invalid input(s): APPERANCEUR    Imaging: DG Chest Port 1 View  Result Date: 02/21/2020 CLINICAL DATA:  Hypoxia. EXAM: PORTABLE CHEST 1 VIEW COMPARISON:  Radiograph 02/16/2020 FINDINGS: Marked cardiomegaly. Unchanged mediastinal contours. Left hemidiaphragm is partially obscured, possible small pleural effusions. Improved left upper lung opacity. Worsening atelectasis at the right lung base. No pulmonary edema. No pneumothorax. Stable osseous structures. IMPRESSION: 1. Marked cardiomegaly. Possible  small left pleural effusion. 2. Improved left upper lung opacity. Worsening  right lung base atelectasis. Electronically Signed   By: Keith Rake M.D.   On: 02/21/2020 22:16     Medications:   . sodium chloride Stopped (02/22/20 0204)  .  ceFAZolin (ANCEF) IV 1 g (02/22/20 0114)  . milrinone 0.25 mcg/kg/min (02/22/20 1234)   . allopurinol  100 mg Oral Daily  . apixaban  2.5 mg Oral BID  . ascorbic acid  500 mg Oral Daily  . carvedilol  3.125 mg Oral BID WC  . Chlorhexidine Gluconate Cloth  6 each Topical Daily  . colchicine  0.3 mg Oral Daily  . ferrous sulfate  325 mg Oral Q breakfast  . FLUoxetine  30 mg Oral Daily  . midodrine  10 mg Oral TID WC  . mupirocin ointment  1 application Nasal BID  . pantoprazole  40 mg Oral BID  . sodium bicarbonate  1,300 mg Oral BID  . sodium chloride flush  3 mL Intravenous Q12H  . thiothixene  1 mg Oral BID   sodium chloride, albuterol, lip balm, loratadine, nitroGLYCERIN, ondansetron **OR** ondansetron (ZOFRAN) IV, sodium chloride flush  Assessment/ Plan:  Katelyn Lane is a 64 y.o. 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 Acute kidney injury on chronic kidney disease stage IV with history of proteinuria: baseline creatinine of 3.88, GFR of 12 on 01/29/20.  Chronic Kidney Disease secondary to hypertension Acute kidney injury with uremia - concern for ATN from prerenal azotemia - After speaking to sister, [name], patient would not want dialysis long term. Family is moving towards comfort care.   2. Hypotension: with decompensated heart failure.   3. Hypokalemia:     LOS: 6 Draden Cottingham 2/14/20221:47 PM

## 2020-02-22 NOTE — Consult Note (Signed)
CARDIOLOGY CONSULT NOTE               Patient ID: Katelyn Lane MRN: [id number] DOB/AGE: Jul 20, 1955 65 y.o.  Admit date: 02/16/2020 Referring Physician Reesa Chew Primary Physician Revelo, Elyse Jarvis, MD  Primary Cardiologist Nehemiah Massed Reason for Consultation chronic systolic CHF  HPI: 65 year old female referred for chronic systolic CHF, started on milrinone. 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 Right and left cardiac catheterization in 12/2018 showed severe dilated cardiomyopathy with LVEF 15% and minimal coronary artery disease.The patient was recently discharged from [facility] after a prolonged hospital stay from 10/11-1/21/22 for acute on chronic systolic CHF, with minimal response to IV Lasix, AKI on CKD, requiring temporary hemodialysis, alcoholic cirrhosis and ascites, status post serial paracenteses, SBP, and an aspiration event. The patient declined rehab or group homes on discharge. The patient was brought to [facility] ER on 02/16/2020 for AMS and shortness of breath after being found on the ground of a motel room where she had been staying, noted to have a right thigh hematoma and laceration after falling from her bed; she remained on the ground for an uncertain duration. Admission labs showed creatinine 8.13, BUN 147, AST 191, ALT 80, total bilirubin 2.6, urine drug screen positive for cocaine, CK 1180, bicarb 16.2, pCO2 28, lactic acid 2.3. Chest xray showed cardiomegaly, possible small left pleural effusion, improved left upper lung opacity, and worsening right lung base atelectasis. 2D echocardiogram on 02/19/2020 shows severely reduced left and right ventricular systolic function with LVEF <20%. Previous echocardiogram 06/2019 showed LVEF 25-30% with normal right ventricular systolic function. The patient was transferred to the ICU and started on milrinone for  heart failure this morning as she continued to have poor improvement of renal function. Currently, the patient is unable to answer ROS due to mental status, but she does state that she is feeling "pretty rough."   Review of systems complete and found to be negative unless listed above     Past Medical History:  Diagnosis Date  . CHF (congestive heart failure) ([place])   . COVID-19   . Hyperlipidemia   . Hypertension   . Renal disorder     Past Surgical History:  Procedure Laterality Date  . DIALYSIS/PERMA CATHETER INSERTION N/A 10/28/2019   Procedure: DIALYSIS/PERMA CATHETER INSERTION;  Surgeon: Katha Cabal, MD;  Location: [place] [facility];  Service: Cardiovascular;  Laterality: N/A;  . DIALYSIS/PERMA CATHETER REMOVAL N/A 11/13/2019   Procedure: DIALYSIS/PERMA CATHETER REMOVAL;  Surgeon: Algernon Huxley, MD;  Location: [place] [facility];  Service: Cardiovascular;  Laterality: N/A;  . RIGHT/LEFT HEART CATH AND CORONARY ANGIOGRAPHY N/A 12/30/2018   Procedure: RIGHT/LEFT HEART CATH AND CORONARY ANGIOGRAPHY;  Surgeon: Corey Skains, MD;  Location: [place] [facility];  Service: Cardiovascular;  Laterality: N/A;    Medications Prior to Admission  Medication Sig Dispense Refill Last Dose  . albuterol (VENTOLIN HFA) 108 (90 Base) MCG/ACT inhaler Inhale 2 puffs into the lungs every 6 (six) hours as needed for wheezing or shortness of breath. 18 g 2   . allopurinol (ZYLOPRIM) 100 MG tablet Take 1 tablet (100 mg total) by mouth daily. 30 tablet 0   . allopurinol (ZYLOPRIM) 100 MG tablet Take 1 tablet (100 mg total) by mouth daily. 30 tablet 2   . apixaban (ELIQUIS) 5 MG TABS tablet Take 1 tablet (5 mg total) by mouth 2 (two) times  daily. 60 tablet 2   . ascorbic acid (VITAMIN C) 500 MG tablet Take 500 mg by mouth daily.     Marland Kitchen atorvastatin (LIPITOR) 20 MG tablet Take 1 tablet (20 mg total) by mouth daily at 6 PM. 30 tablet 2   . atorvastatin (LIPITOR) 20 MG tablet Take 1 tablet (20 mg  total) by mouth daily at 6 PM. 30 tablet 2   . bumetanide (BUMEX) 2 MG tablet Take 1 tablet (2 mg total) by mouth 2 (two) times daily. 60 tablet 2   . ciprofloxacin (CIPRO) 250 MG tablet Take 1 tablet (250 mg total) by mouth daily with breakfast. For SBP prophylaxis 30 tablet 2   . ferrous sulfate 325 (65 FE) MG tablet Take 1 tablet (325 mg total) by mouth daily with breakfast. 30 tablet 0   . ferrous sulfate 325 (65 FE) MG tablet Take 1 tablet (325 mg total) by mouth daily with breakfast. 30 tablet 2   . FLUoxetine (PROZAC) 10 MG capsule Take 3 capsules (30 mg total) by mouth daily. 90 capsule 0   . FLUoxetine (PROZAC) 10 MG capsule Take 3 capsules (30 mg total) by mouth daily. 90 capsule 2   . hydrocortisone cream 0.5 % Apply topically 3 (three) times daily as needed for itching. 15 g 0   . loratadine (CLARITIN) 10 MG tablet Take 10 mg by mouth daily as needed for allergies.      . midodrine (PROAMATINE) 10 MG tablet Take 1 tablet (10 mg total) by mouth 3 (three) times daily with meals. 90 tablet 2   . nitroGLYCERIN (NITROSTAT) 0.4 MG SL tablet Place 1 tablet (0.4 mg total) under the tongue every 5 (five) minutes as needed for chest pain. 30 tablet 1   . pantoprazole (PROTONIX) 40 MG tablet Take 1 tablet (40 mg total) by mouth 2 (two) times daily. 60 tablet 2   . spironolactone (ALDACTONE) 25 MG tablet Take 1 tablet (25 mg total) by mouth daily. 30 tablet 2   . thiothixene (NAVANE) 1 MG capsule Take 1 capsule (1 mg total) by mouth 2 (two) times daily. 60 capsule 0   . thiothixene (NAVANE) 1 MG capsule Take 1 capsule (1 mg total) by mouth 2 (two) times daily. 60 capsule 2    Social History   Socioeconomic History  . Marital status: Single    Spouse name: Not on file  . Number of children: Not on file  . Years of education: Not on file  . Highest education level: Not on file  Occupational History  . Not on file  Tobacco Use  . Smoking status: Current Every Day Smoker  . Smokeless tobacco:  Never Used  Substance and Sexual Activity  . Alcohol use: No    Alcohol/week: 0.0 standard drinks  . Drug use: Yes    Types: Cocaine    Comment: 3-4 days ago  . Sexual activity: Not on file  Other Topics Concern  . Not on file  Social History Narrative  . Not on file   Social Determinants of Health   Financial Resource Strain: Not on file  Food Insecurity: Not on file  Transportation Needs: Not on file  Physical Activity: Not on file  Stress: Not on file  Social Connections: Not on file  Intimate Partner Violence: Not on file    Family History  Problem Relation Age of Onset  . Breast cancer Neg Hx       Review of systems complete and found  to be negative unless listed above      PHYSICAL EXAM  General: Ill appearing, lying in bed HEENT:  Normocephalic and atramatic  Lungs: normal effort of breathing on room air, no wheezing Heart: tachycardic, regular. Normal S1 and S2 without gallops or murmurs. Extremities: warm, mild edema bilaterally Neuro: drowsy, opens eyes to verbal stimulation  Labs:   Lab Results  Component Value Date   WBC 4.3 02/22/2020   HGB 10.3 (L) 02/22/2020   HCT 31.4 (L) 02/22/2020   MCV 68.1 (L) 02/22/2020   PLT 121 (L) 02/22/2020    Recent Labs  Lab 02/22/20 0355  NA 140  K 3.5  CL 97*  CO2 24  BUN 145*  CREATININE 7.05*  CALCIUM 9.5  PROT 6.6  BILITOT 2.4*  ALKPHOS 145*  ALT 27  AST 74*  GLUCOSE 127*   Lab Results  Component Value Date   CKTOTAL 447 (H) 02/18/2020    Lab Results  Component Value Date   CHOL 100 05/22/2019   CHOL 116 04/30/2019   CHOL 126 03/07/2019   Lab Results  Component Value Date   HDL 46 05/22/2019   HDL 41 04/30/2019   HDL 32 (L) 03/07/2019   Lab Results  Component Value Date   LDLCALC 43 05/22/2019   LDLCALC 58 04/30/2019   LDLCALC 50 03/07/2019   Lab Results  Component Value Date   TRIG 56 05/22/2019   TRIG 86 04/30/2019   TRIG 222 (H) 03/07/2019   TRIG 232 (H) 03/07/2019    Lab Results  Component Value Date   CHOLHDL 2.2 05/22/2019   CHOLHDL 2.8 04/30/2019   CHOLHDL 3.9 03/07/2019   No results found for: LDLDIRECT    Radiology: CT Head Wo Contrast  Result Date: 02/16/2020 CLINICAL DATA:  Altered mental status EXAM: CT HEAD WITHOUT CONTRAST TECHNIQUE: Contiguous axial images were obtained from the base of the skull through the vertex without intravenous contrast. COMPARISON:  08/15/2019 FINDINGS: Brain: There is no acute intracranial hemorrhage, mass effect, or edema. Gray-white differentiation is preserved. There is no extra-axial fluid collection. Ventricles and sulci are stable in size and configuration. Patchy hypoattenuation in the supratentorial white matter is nonspecific but may reflect stable mild chronic microvascular ischemic changes. 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 Vascular: There is atherosclerotic calcification at the skull base. Skull: Calvarium is unremarkable. Sinuses/Orbits: No acute finding. Other: None. IMPRESSION: No acute intracranial abnormality. Electronically Signed   By: Macy Mis M.D.   On: 02/16/2020 14:23   CT Cervical Spine Wo Contrast  Result Date: 02/16/2020 CLINICAL DATA:  Altered mental status, possible trauma EXAM: CT CERVICAL SPINE WITHOUT CONTRAST TECHNIQUE: Multidetector CT imaging of the cervical spine was performed without intravenous contrast. Multiplanar CT image reconstructions were also generated. COMPARISON:  None. FINDINGS: Motion artifact is present. Alignment: Trace retrolisthesis at C3-C4 and C6-C7. Skull base and vertebrae: No acute fracture within the above limitation. Vertebral body heights are maintained apart from mild degenerative endplate irregularity. Soft tissues and spinal canal: No prevertebral fluid or swelling. No visible canal hematoma. Disc levels:  Mild multilevel degenerative changes are present. Upper chest: Minimally imaged. Other: Calcified plaque at the common  carotid bifurcations. IMPRESSION: No acute fracture. Study is degraded by motion artifact and a subtle fracture may not be apparent. Electronically Signed   By: Macy Mis M.D.   On: 02/16/2020 14:30   US Paracentesis  Result Date: 01/27/2020 INDICATION: Cirrhosis with ascites EXAM: ULTRASOUND GUIDED  PARACENTESIS MEDICATIONS: None. COMPLICATIONS: None immediate. PROCEDURE: Informed written consent was obtained from the patient after a discussion of the risks, benefits and alternatives to treatment. A timeout was performed prior to the initiation of the procedure. Initial ultrasound scanning demonstrates a moderate amount of ascites within the right lower abdominal quadrant. The right lower abdomen was prepped and draped in the usual sterile fashion. 1% lidocaine was used for local anesthesia. Following this, a 6 Fr Safe-T-Centesis catheter was introduced. An ultrasound image was saved for documentation purposes. The paracentesis was performed. The catheter was removed and a dressing was applied. The patient tolerated the procedure well without immediate post procedural complication. FINDINGS: A total of approximately 5.3 L of straw-colored fluid was removed. Samples were sent to the laboratory as requested by the clinical team. IMPRESSION: Successful ultrasound-guided paracentesis yielding 5.3 liters of peritoneal fluid. Electronically Signed   By: Miachel Roux M.D.   On: 01/27/2020 16:57   DG Chest Port 1 View  Result Date: 02/21/2020 CLINICAL DATA:  Hypoxia. EXAM: PORTABLE CHEST 1 VIEW COMPARISON:  Radiograph 02/16/2020 FINDINGS: Marked cardiomegaly. Unchanged mediastinal contours. Left hemidiaphragm is partially obscured, possible small pleural effusions. Improved left upper lung opacity. Worsening atelectasis at the right lung base. No pulmonary edema. No pneumothorax. Stable osseous structures. IMPRESSION: 1. Marked cardiomegaly. Possible small left pleural effusion. 2. Improved left upper lung  opacity. Worsening right lung base atelectasis. Electronically Signed   By: Keith Rake M.D.   On: 02/21/2020 22:16   DG Chest Portable 1 View  Result Date: 02/16/2020 CLINICAL DATA:  Altered mental status EXAM: PORTABLE CHEST 1 VIEW COMPARISON:  01/06/2020 FINDINGS: Small left upper lung opacity. No pleural effusion. No pneumothorax. Stable cardiomegaly. IMPRESSION: Small left upper lung atelectasis/consolidation. Stable cardiomegaly. Electronically Signed   By: Macy Mis M.D.   On: 02/16/2020 14:19   DG Knee Complete 4 Views Left  Result Date: 02/18/2020 CLINICAL DATA:  Left knee pain after fall. EXAM: LEFT KNEE - COMPLETE 4+ VIEW COMPARISON:  None. FINDINGS: No evidence of fracture, dislocation, or joint effusion. No evidence of arthropathy or other focal bone abnormality. Soft tissues are unremarkable. IMPRESSION: Negative. Electronically Signed   By: Marijo Conception M.D.   On: 02/18/2020 10:42   ECHOCARDIOGRAM COMPLETE  Result Date: 02/19/2020    ECHOCARDIOGRAM REPORT   Patient Name:   JURIDIA NAMBO Date of Exam: 02/19/2020 Medical Rec #:  [id number]     Height:       62.0 in Accession #:    [id number]    Weight:       184.1 lb Date of Birth:  04/12/1955    BSA:          1.846 m Patient Age:    62 years      BP:           101/89 mmHg Patient Gender: F             HR:           119 bpm. Exam Location:  [facility] Procedure: 2D Echo, Color Doppler and Cardiac Doppler Indications:     R78.81 Bacteremia  History:         Patient has prior history of Echocardiogram examinations, most                  recent 06/22/2019. CHF, CKD; Risk Factors:Hypertension and                  Dyslipidemia. Hx of COVID-19.  Sonographer:     Charmayne Sheer RDCS (AE) Referring Phys:  [id number] Tsosie Billing Diagnosing Phys: Neoma Laming MD  Sonographer Comments: Image acquisition challenging due to uncooperative patient. IMPRESSIONS  1. Left ventricular ejection fraction, by estimation, is <20%. The left ventricle has  severely decreased function. The left ventricle demonstrates global hypokinesis. The left ventricular internal cavity size was severely dilated. There is mild concentric left ventricular hypertrophy. Left ventricular diastolic parameters are consistent with Grade III diastolic dysfunction (restrictive).  2. Right ventricular systolic function is severely reduced. The right ventricular size is severely enlarged. Mildly increased right ventricular wall thickness.  3. Left atrial size was severely dilated.  4. Right atrial size was severely dilated.  5. The mitral valve is degenerative. Mild mitral valve regurgitation. Moderate mitral annular calcification.  6. The tricuspid valve is degenerative. Tricuspid valve regurgitation is severe.  7. The aortic valve is calcified. Aortic valve regurgitation is not visualized. Mild aortic valve sclerosis is present, with no evidence of aortic valve stenosis. Conclusion(s)/Recommendation(s): Findings consistent with dilated cardiomyopathy. FINDINGS  Left Ventricle: Left ventricular ejection fraction, by estimation, is <20%. The left ventricle has severely decreased function. The left ventricle demonstrates global hypokinesis. The left ventricular internal cavity size was severely dilated. There is mild concentric left ventricular hypertrophy. Left ventricular diastolic parameters are consistent with Grade III diastolic dysfunction (restrictive). Right Ventricle: The right ventricular size is severely enlarged. Mildly increased right ventricular wall thickness. Right ventricular systolic function is severely reduced. Left Atrium: Left atrial size was severely dilated. Right Atrium: Right atrial size was severely dilated. Pericardium: There is no evidence of pericardial effusion. Mitral Valve: The mitral valve is degenerative in appearance. Moderate mitral annular calcification. Mild mitral valve regurgitation. MV peak gradient, 5.1 mmHg. The mean mitral valve gradient is 2.0 mmHg.  Tricuspid Valve: The tricuspid valve is degenerative in appearance. Tricuspid valve regurgitation is severe. Aortic Valve: The aortic valve is calcified. Aortic valve regurgitation is not visualized. Mild aortic valve sclerosis is present, with no evidence of aortic valve stenosis. Aortic valve mean gradient measures 2.0 mmHg. Aortic valve peak gradient measures 3.4 mmHg. Aortic valve area, by VTI measures 1.32 cm. Pulmonic Valve: The pulmonic valve was grossly normal. Pulmonic valve regurgitation is mild to moderate. Aorta: The aortic root was not well visualized. IAS/Shunts: No atrial level shunt detected by color flow Doppler.  LEFT VENTRICLE PLAX 2D LVIDd:         5.60 cm      Diastology LVIDs:         5.20 cm      LV e' medial:    3.81 cm/s LV PW:         1.10 cm      LV E/e' medial:  22.7 LV IVS:        0.70 cm      LV e' lateral:   8.05 cm/s LVOT diam:     1.80 cm      LV E/e' lateral: 10.7 LV SV:         15 LV SV Index:   8 LVOT Area:     2.54 cm  LV Volumes (MOD) LV vol d, MOD A4C: 184.0 ml LV vol s, MOD A4C: 153.0 ml LV SV MOD A4C:     184.0 ml RIGHT VENTRICLE RV Basal diam:  4.80 cm LEFT ATRIUM           Index       RIGHT ATRIUM  Index LA diam:      5.10 cm 2.76 cm/m  RA Area:     29.90 cm LA Vol (A2C): 41.8 ml 22.65 ml/m RA Volume:   115.00 ml 62.31 ml/m LA Vol (A4C): 68.2 ml 36.95 ml/m  AORTIC VALVE                   PULMONIC VALVE AV Area (Vmax):    1.50 cm    PV Vmax:       0.69 m/s AV Area (Vmean):   1.41 cm    PV Vmean:      43.300 cm/s AV Area (VTI):     1.32 cm    PV VTI:        0.070 m AV Vmax:           92.20 cm/s  PV Peak grad:  1.9 mmHg AV Vmean:          63.700 cm/s PV Mean grad:  1.0 mmHg AV VTI:            0.112 m AV Peak Grad:      3.4 mmHg AV Mean Grad:      2.0 mmHg LVOT Vmax:         54.20 cm/s LVOT Vmean:        35.400 cm/s LVOT VTI:          0.058 m LVOT/AV VTI ratio: 0.52  AORTA Ao Root diam: 3.10 cm MITRAL VALVE               TRICUSPID VALVE MV Area (PHT): 5.06  cm    TR Peak grad:   29.2 mmHg MV Area VTI:   1.06 cm    TR Vmax:        270.00 cm/s MV Peak grad:  5.1 mmHg MV Mean grad:  2.0 mmHg    SHUNTS MV Vmax:       1.13 m/s    Systemic VTI:  0.06 m MV Vmean:      67.8 cm/s   Systemic Diam: 1.80 cm MV Decel Time: 150 msec MV E velocity: 86.53 cm/s Neoma Laming MD Electronically signed by Neoma Laming MD Signature Date/Time: 02/19/2020/12:46:01 PM    Final    Telemetry: sinus tachycardia, 120s  ASSESSMENT AND PLAN:  1. Chronic congestive heart failure and dilated cardiomyopathy with LVEF <20%, chest xray with small pleural effusion, currently on milrinone. Does not appear to be in an acute exacerbation. Right and left cardiac catheterization in 12/2018 showed severe dilated cardiomyopathy with LVEF 15% and minimal coronary artery disease. Diuretics on hold. 2. AKI on CKD IV, required temporary dialysis during recent admission. Creatinine currently 7.05. Diuretics on hold. 3. Rhabdomyolysis, secondary to lying on ground for uncertain duration after fall 4. Polysubstance abuse, UDS positive for cocaine 5. History of ventricular thrombus, on Eliquis 6. Possible aspiration pneumonia 7. Hypotension and tachycardia, on milrinone and carvedilol  Recommendations: 1. Will continue to follow while patient is on milrinone, carefully monitoring for ventricular arrhythmias 2. Hold diuretics at this time due to hypotension 3. Continue carvedilol if blood pressure permits    Signed: Clabe Seal PA-C 02/22/2020, 8:35 AM

## 2020-02-22 NOTE — Progress Notes (Signed)
[employer] with worsened respiratory distress and oxygen requirements with lethargy. VBG mil metabolic acidosis.  Continues to have poor improvement in renal function and over 7 Liters fluid.  Discussed with Dr. [name]  Transferred to ICU. Gave one time high dose lasix. Started milrinone for her heart failure with CO of less than 20%.. Little to no response from lasix. One dose albumin followed by 1 mg bumex given .  Discussed with daughters over phone

## 2020-02-22 NOTE — Progress Notes (Signed)
NP notified that patient is coughing up bloody clots of sputum and currently on Eliquis. Last noted H/H 11.4/33.7

## 2020-02-22 NOTE — Progress Notes (Signed)
Date of Admission:  02/16/2020    ID: Katelyn Lane is a 65 y.o. female  Principal Problem:   AKI (acute kidney injury) ([place]) Active Problems:   Depression   Rhabdomyolysis   Acute metabolic encephalopathy   History of substance abuse ([place])   Nonsustained ventricular tachycardia ([facility])   Alcoholic cirrhosis of liver with ascites ([facility])   Transaminitis   Chronic systolic CHF (congestive heart failure) ([facility])    Subjective: Pt transferred to ICU today No history available from patient  Medications:  . allopurinol  100 mg Oral Daily  . apixaban  2.5 mg Oral BID  . ascorbic acid  500 mg Oral Daily  . carvedilol  3.125 mg Oral BID WC  . Chlorhexidine Gluconate Cloth  6 each Topical Daily  . colchicine  0.3 mg Oral Daily  . ferrous sulfate  325 mg Oral Q breakfast  . FLUoxetine  30 mg Oral Daily  . midodrine  10 mg Oral TID WC  . mupirocin ointment  1 application Nasal BID  . pantoprazole  40 mg Oral BID  . sodium bicarbonate  1,300 mg Oral BID  . sodium chloride flush  3 mL Intravenous Q12H  . thiothixene  1 mg Oral BID    Objective: Vital signs in last 24 hours: Temp:  [94 F (34.4 C)-97.9 F (36.6 C)] 97.9 F (36.6 C) (02/14 0800) Pulse Rate:  [29-126] 125 (02/14 1100) Resp:  [14-44] 25 (02/14 1100) BP: (82-138)/(62-93) 95/67 (02/14 1100) SpO2:  [62 %-100 %] 96 % (02/14 1100) Weight:  [79.5 kg] 79.5 kg (02/13 2216)  PHYSICAL EXAM:  General: lethargic Somnolent Rt epistaxis- now stopped ENT rt nares has tissue Chest b/l air entry. Lungs: b/l air entry Heart: Regular rate and rhythm, no murmur, rub or gallop. Abdomen: Soft, non-tender,not distended. Bowel sounds normal. No masses Extremities: painful feet Skin: bruises rt side Neurologic:encephalopathic Myoclonic jerks  Lab Results Recent Labs    02/21/20 0413 02/22/20 0355  WBC 3.6* 4.3  HGB 11.4* 10.3*  HCT 33.7* 31.4*  NA 139 140  K 3.3* 3.5  CL 96* 97*  CO2 21* 24  BUN 137* 145*  CREATININE  6.67* 7.05*   Liver Panel Recent Labs    02/20/20 0403 02/21/20 0413 02/22/20 0355  PROT 6.6  --  6.6  ALBUMIN 2.6*  2.5* 2.6* 2.8*  AST 83*  --  74*  ALT 47*  --  27  ALKPHOS 138*  --  145*  BILITOT 2.6*  --  2.4*  BILIDIR 1.4*  --   --   IBILI 1.2*  --   --    Sedimentation Rate No results for input(s): ESRSEDRATE in the last 72 hours. C-Reactive Protein No results for input(s): CRP in the last 72 hours.  Microbiology:  Studies/Results: DG Chest Port 1 View  Result Date: 02/21/2020 CLINICAL DATA:  Hypoxia. EXAM: PORTABLE CHEST 1 VIEW COMPARISON:  Radiograph 02/16/2020 FINDINGS: Marked cardiomegaly. Unchanged mediastinal contours. Left hemidiaphragm is partially obscured, possible small pleural effusions. Improved left upper lung opacity. Worsening atelectasis at the right lung base. No pulmonary edema. No pneumothorax. Stable osseous structures. IMPRESSION: 1. Marked cardiomegaly. Possible small left pleural effusion. 2. Improved left upper lung opacity. Worsening right lung base atelectasis. Electronically Signed   By: Keith Rake M.D.   On: 02/21/2020 22:16     Assessment/Plan:  Acute metabolic encephalopathy due to uremia. AKI on CKD.  Patient now has metabolic acidosis, severe uremic encephalopathy, epistaxis, myoclonic jerks.  As  per nephrologist and family no dialysis.  Heading towards comfort care.  Strep mitis and strep epidermidis in 1 of 4 bottle in blood culture is a contaminant.  Repeat blood cultures negative.  Was on ceftriaxone which was later changed to cefazolin but will stop today as she is already received 7 days of antibiotic.  Acute gout  Cirrhosis of the liver with ascites  Dilated cardiomyopathy with EF of less than 20%.  Very likely due to cocaine  Left knee effusion likely due to gout  Rhabdomyolysis secondary to fall and cocaine.  Improved  As patient is not going for dialysis she needs palliative care consult. Discussed the  management with the care team. ID will sign off call if needed.Marland Kitchen

## 2020-02-23 DIAGNOSIS — Z7189 Other specified counseling: Secondary | ICD-10-CM

## 2020-02-23 DIAGNOSIS — Z515 Encounter for palliative care: Secondary | ICD-10-CM

## 2020-02-23 LAB — ANCA TITERS
Atypical P-ANCA titer: <1:20 {titer}
C-ANCA: <1:20 {titer}
P-ANCA: <1:20 {titer}

## 2020-02-23 LAB — COMPREHENSIVE METABOLIC PANEL
ALT: 14 U/L (ref 0–44)
AST: 52 U/L — ABNORMAL HIGH (ref 15–41)
Albumin: 2.5 g/dL — ABNORMAL LOW (ref 3.5–5.0)
Alkaline Phosphatase: 128 U/L — ABNORMAL HIGH (ref 38–126)
Anion gap: 18 — ABNORMAL HIGH (ref 5–15)
BUN: 155 mg/dL — ABNORMAL HIGH (ref 8–23)
CO2: 26 mmol/L (ref 22–32)
Calcium: 9.4 mg/dL (ref 8.9–10.3)
Chloride: 97 mmol/L — ABNORMAL LOW (ref 98–111)
Creatinine, Ser: 6.99 mg/dL — ABNORMAL HIGH (ref 0.44–1.00)
GFR, Estimated: 6 mL/min — ABNORMAL LOW (ref >60)
Glucose, Bld: 107 mg/dL — ABNORMAL HIGH (ref 70–99)
Potassium: 3 mmol/L — ABNORMAL LOW (ref 3.5–5.1)
Sodium: 141 mmol/L (ref 135–145)
Total Bilirubin: 2.6 mg/dL — ABNORMAL HIGH (ref 0.3–1.2)
Total Protein: 6.2 g/dL — ABNORMAL LOW (ref 6.5–8.1)

## 2020-02-23 LAB — GLUCOSE, CAPILLARY
Glucose-Capillary: 83 mg/dL (ref 70–99)
Glucose-Capillary: 97 mg/dL (ref 70–99)
Glucose-Capillary: 120 mg/dL — ABNORMAL HIGH (ref 70–99)
Glucose-Capillary: 144 mg/dL — ABNORMAL HIGH (ref 70–99)

## 2020-02-23 LAB — CBC
HCT: 29.5 % — ABNORMAL LOW (ref 36.0–46.0)
Hemoglobin: 10.1 g/dL — ABNORMAL LOW (ref 12.0–15.0)
MCH: 23 pg — ABNORMAL LOW (ref 26.0–34.0)
MCHC: 34.2 g/dL (ref 30.0–36.0)
MCV: 67 fL — ABNORMAL LOW (ref 80.0–100.0)
Platelets: 128 10*3/uL — ABNORMAL LOW (ref 150–400)
RBC: 4.4 MIL/uL (ref 3.87–5.11)
RDW: 22.6 % — ABNORMAL HIGH (ref 11.5–15.5)
WBC: 4.9 10*3/uL (ref 4.0–10.5)
nRBC: 1.9 % — ABNORMAL HIGH (ref 0.0–0.2)

## 2020-02-23 LAB — PROTIME-INR
INR: 3.1 — ABNORMAL HIGH (ref 0.8–1.2)
Prothrombin Time: 31.2 seconds — ABNORMAL HIGH (ref 11.4–15.2)

## 2020-02-23 LAB — GLOMERULAR BASEMENT MEMBRANE ANTIBODIES: GBM Ab: 4 units (ref 0–20)

## 2020-02-23 MED ORDER — LORAZEPAM 2 MG/ML IJ SOLN
1.0000 mg | INTRAMUSCULAR | Status: DC | PRN
  Administered 2020-02-24: 1 mg via INTRAVENOUS

## 2020-02-23 MED ORDER — POTASSIUM CHLORIDE CRYS ER 20 MEQ PO TBCR
40.0000 meq | EXTENDED_RELEASE_TABLET | Freq: Two times a day (BID) | ORAL | Status: AC
  Administered 2020-02-23 (×2): 40 meq via ORAL
  Filled 2020-02-23 (×2): qty 2

## 2020-02-23 MED ORDER — HALOPERIDOL 0.5 MG PO TABS
0.5000 mg | ORAL_TABLET | ORAL | Status: DC | PRN
  Filled 2020-02-23: qty 1

## 2020-02-23 MED ORDER — ACETAMINOPHEN 650 MG RE SUPP: 650.0000 mg | Freq: Four times a day (QID) | RECTAL | Status: DC | PRN

## 2020-02-23 MED ORDER — LORAZEPAM 1 MG PO TABS: 1.0000 mg | ORAL_TABLET | ORAL | Status: DC | PRN

## 2020-02-23 MED ORDER — LORAZEPAM 2 MG/ML PO CONC
1.0000 mg | ORAL | Status: DC | PRN
  Filled 2020-02-23: qty 0.5

## 2020-02-23 MED ORDER — LORAZEPAM 2 MG/ML PO CONC: 1.0000 mg | ORAL | Status: DC | PRN

## 2020-02-23 MED ORDER — ACETAMINOPHEN 325 MG PO TABS: 650.0000 mg | ORAL_TABLET | Freq: Four times a day (QID) | ORAL | Status: DC | PRN

## 2020-02-23 MED ORDER — HALOPERIDOL LACTATE 5 MG/ML IJ SOLN: 0.5000 mg | INTRAMUSCULAR | Status: DC | PRN

## 2020-02-23 MED ORDER — HALOPERIDOL LACTATE 2 MG/ML PO CONC
0.5000 mg | ORAL | Status: DC | PRN
  Filled 2020-02-23: qty 0.3

## 2020-02-23 MED ORDER — MORPHINE SULFATE (CONCENTRATE) 10 MG/0.5ML PO SOLN: 5.0000 mg | ORAL | Status: DC | PRN

## 2020-02-23 MED ORDER — GLYCOPYRROLATE 0.2 MG/ML IJ SOLN
0.2000 mg | INTRAMUSCULAR | Status: DC | PRN
  Filled 2020-02-23: qty 1

## 2020-02-23 MED ORDER — LORAZEPAM 2 MG/ML IJ SOLN
1.0000 mg | INTRAMUSCULAR | Status: DC | PRN
  Filled 2020-02-23: qty 1

## 2020-02-23 MED ORDER — MORPHINE SULFATE (PF) 2 MG/ML IV SOLN: 1.0000 mg | INTRAVENOUS | Status: DC | PRN

## 2020-02-23 MED ORDER — POLYVINYL ALCOHOL 1.4 % OP SOLN
1.0000 [drp] | Freq: Four times a day (QID) | OPHTHALMIC | Status: DC | PRN
  Filled 2020-02-23: qty 15

## 2020-02-23 MED ORDER — OXYCODONE HCL 5 MG PO TABS: 5.0000 mg | ORAL_TABLET | ORAL | Status: DC | PRN

## 2020-02-23 MED ORDER — ONDANSETRON HCL 4 MG/2ML IJ SOLN
INTRAMUSCULAR | Status: AC
  Administered 2020-02-23: 4 mg
  Filled 2020-02-23: qty 2

## 2020-02-23 MED ORDER — DIPHENHYDRAMINE HCL 50 MG/ML IJ SOLN: 12.5000 mg | INTRAMUSCULAR | Status: DC | PRN

## 2020-02-23 MED ORDER — GLYCOPYRROLATE 1 MG PO TABS
1.0000 mg | ORAL_TABLET | ORAL | Status: DC | PRN
  Filled 2020-02-23: qty 1

## 2020-02-23 MED ORDER — PHENYLEPHRINE HCL 0.5 % NA SOLN
1.0000 [drp] | Freq: Four times a day (QID) | NASAL | Status: DC | PRN
  Filled 2020-02-23: qty 15

## 2020-02-23 MED ORDER — HYDROMORPHONE HCL 1 MG/ML IJ SOLN
0.5000 mg | INTRAMUSCULAR | Status: DC | PRN
  Administered 2020-02-24 (×2): 0.5 mg via INTRAVENOUS
  Filled 2020-02-23 (×2): qty 0.5

## 2020-02-23 NOTE — Plan of Care (Signed)
Overall plan shifting toward comfort care. Pt BP marginal over shift. 4L [place] started midday for intermittent low saturations likely related to sleep apnea. Continues to have blood tinged thick sputum. Pt has had multiple loose stools over day. Eating/drinking sparingly. Palliative care has added new orders to ensure patient comfort moving forward.   Problem: Clinical Measurements: Goal: Will remain free from infection Outcome: Progressing Goal: Diagnostic test results will improve Outcome: Progressing   Problem: Coping: Goal: Level of anxiety will decrease Outcome: Progressing   Problem: Elimination: Goal: Will not experience complications related to bowel motility Outcome: Progressing Goal: Will not experience complications related to urinary retention Outcome: Progressing   Problem: Pain Managment: Goal: General experience of comfort will improve Outcome: Progressing   Problem: Safety: Goal: Ability to remain free from injury will improve Outcome: Progressing   Problem: Skin Integrity: Goal: Risk for impaired skin integrity will decrease Outcome: Progressing   Problem: Education: Goal: Knowledge of General Education information will improve Description: Including pain rating scale, medication(s)/side effects and non-pharmacologic comfort measures Outcome: Not Progressing   Problem: Health Behavior/Discharge Planning: Goal: Ability to manage health-related needs will improve Outcome: Not Progressing   Problem: Clinical Measurements: Goal: Ability to maintain clinical measurements within normal limits will improve Outcome: Not Progressing Goal: Respiratory complications will improve Outcome: Not Progressing Goal: Cardiovascular complication will be avoided Outcome: Not Progressing   Problem: Activity: Goal: Risk for activity intolerance will decrease Outcome: Not Progressing   Problem: Nutrition: Goal: Adequate nutrition will be maintained Outcome: Not  Progressing
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
## 2020-02-23 NOTE — Progress Notes (Signed)
Pt continues to try to get OOB after numerous attempts to redirect, states she wants "to use bedside commode."

## 2020-02-23 NOTE — Progress Notes (Signed)
Pt CBG at 83-This NT gave pt 2 oz of orange juice

## 2020-02-23 NOTE — Progress Notes (Signed)
[facility]  ROUNDING NOTE   Subjective:   Palliative care working with family.   Lethargic.   UOP 758m.   Objective:  Vital signs in last 24 hours:  Temp:  [96.5 F (35.8 C)-98.8 F (37.1 C)] 96.7 F (35.9 C) (02/15 1200) Pulse Rate:  [51-129] 117 (02/15 1300) Resp:  [10-32] 17 (02/15 1100) BP: (79-114)/(53-82) 87/58 (02/15 1300) SpO2:  [86 %-100 %] 100 % (02/15 1300)  Weight change:  Filed Weights   02/19/20 0100 02/21/20 0345 02/21/20 2216  Weight: 83.5 kg 82.5 kg 79.5 kg    Intake/Output: I/O last 3 completed shifts: In: 454.9 [P.O.:20; I.V.:200.6; IV Piggyback:234.3] Out: 805 [Urine:805]   Intake/Output this shift:  Total I/O In: 310 [P.O.:300; I.V.:10] Out: 125 [Urine:125]  Physical Exam: General: Laying in bed, lethargic  Head: +nasal packing  Eyes: Anicteric, PERRL  Neck: Supple, trachea midline  Lungs:  Bilateral crackles  Heart: Regular rate and rhythm  Abdomen:  Soft, nontender,   Extremities: + peripheral edema.  Neurologic: Alert to self only  Skin: No lesions  Access: none    Basic Metabolic Panel: Recent Labs  Lab 02/17/20 0451 02/18/20 0556 02/19/20 0406 02/20/20 0403 02/21/20 0413 02/22/20 0355 02/23/20 0430  NA 140   < > 140 141 139 140 141  K 4.2   < > 3.5 3.3* 3.3* 3.5 3.0*  CL 102   < > 97* 95* 96* 97* 97*  CO2 19*   < > 23 21* 21* 24 26  GLUCOSE 103*   < > 102* 72 83 127* 107*  BUN 147*   < > 131* 127* 137* 145* 155*  CREATININE 8.13*   < > 6.67* 6.58* 6.67* 7.05* 6.99*  CALCIUM 8.8*   < > 9.3 9.6 9.5 9.5 9.4  MG 2.3  --   --   --   --   --   --   PHOS  --   --  5.8* 6.1* 6.3*  --   --    < > = values in this interval not displayed.    Liver Function Tests: Recent Labs  Lab 02/16/20 1326 02/18/20 0556 02/19/20 0406 02/20/20 0403 02/21/20 0413 02/22/20 0355 02/23/20 0430  AST 191* 121*  --  83*  --  74* 52*  ALT 80* 61*  --  47*  --  27 14  ALKPHOS 158* 131*  --  138*  --  145* 128*  BILITOT  2.6* 2.5*  --  2.6*  --  2.4* 2.6*  PROT 7.3 6.6  --  6.6  --  6.6 6.2*  ALBUMIN 3.0* 2.6* 2.7* 2.6*  2.5* 2.6* 2.8* 2.5*   No results for input(s): LIPASE, AMYLASE in the last 168 hours. Recent Labs  Lab 02/16/20 1508 02/20/20 0403  AMMONIA 25 28    CBC: Recent Labs  Lab 02/16/20 1326 02/17/20 0451 02/19/20 0406 02/20/20 0403 02/21/20 0413 02/22/20 0355 02/23/20 0430  WBC 10.2   < > 4.8 3.9* 3.6* 4.3 4.9  NEUTROABS 8.6*  --   --   --   --   --   --   HGB 11.1*   < > 10.9* 11.4* 11.4* 10.3* 10.1*  HCT 34.7*   < > 33.7* 35.3* 33.7* 31.4* 29.5*  MCV 68.2*   < > 68.1* 68.3* 66.3* 68.1* 67.0*  PLT 158   < > 120* 122* 129* 121* 128*   < > = values in this interval not displayed.    Cardiac  Enzymes: Recent Labs  Lab 02/16/20 1326 02/17/20 0451 02/18/20 0556  CKTOTAL 1,180* 609* 447*    BNP: Invalid input(s): POCBNP  CBG: Recent Labs  Lab 02/22/20 1947 02/22/20 2334 02/23/20 0329 02/23/20 0736 02/23/20 1124  GLUCAP 120* 113* 83 56 120*    Microbiology: Results for orders placed or performed during the hospital encounter of 02/16/20  Culture, blood (routine x 2)     Status: Abnormal   Collection Time: 02/16/20  2:28 PM   Specimen: BLOOD  Result Value Ref Range Status   Specimen Description   Final    BLOOD BLOOD LEFT HAND Performed at [facility], [address] [place], [place] [address]    Special Requests   Final    BOTTLES DRAWN AEROBIC AND ANAEROBIC Blood Culture adequate volume Performed at [facility], [address] [place], [place] [address]    Culture  Setup Time   Final    [place] CRITICAL RESULT CALLED TO, READ BACK BY AND VERIFIED WITH: NATHAN BELUE PHARMD [id number] 02/17/20 HNM    Culture (A)  Final    STREPTOCOCCUS MITIS/ORALIS STAPHYLOCOCCUS EPIDERMIDIS THE SIGNIFICANCE OF ISOLATING THIS ORGANISM FROM A SINGLE SET OF BLOOD CULTURES WHEN MULTIPLE SETS ARE DRAWN IS UNCERTAIN. PLEASE  NOTIFY THE MICROBIOLOGY DEPARTMENT WITHIN ONE WEEK IF SPECIATION AND SENSITIVITIES ARE REQUIRED. Performed at [facility], [place] [address], [place], [place] [address]    Report Status 02/19/2020 FINAL  Final  Blood Culture ID Panel (Reflexed)     Status: Abnormal   Collection Time: 02/16/20  2:28 PM  Result Value Ref Range Status   Enterococcus faecalis NOT DETECTED NOT DETECTED Final   Enterococcus Faecium NOT DETECTED NOT DETECTED Final   Listeria monocytogenes NOT DETECTED NOT DETECTED Final   Staphylococcus species DETECTED (A) NOT DETECTED Final    Comment: CRITICAL RESULT CALLED TO, READ BACK BY AND VERIFIED WITHLloyd Huger PHARMD [id number] 02/17/20 HNM    Staphylococcus aureus (BCID) NOT DETECTED NOT DETECTED Final   Staphylococcus epidermidis DETECTED (A) NOT DETECTED Final    Comment: Methicillin (oxacillin) resistant coagulase negative staphylococcus. Possible blood culture contaminant (unless isolated from more than one blood culture draw or clinical case suggests pathogenicity). No antibiotic treatment is indicated for blood  culture contaminants. CRITICAL RESULT CALLED TO, READ BACK BY AND VERIFIED WITH: Lloyd Huger PHARMD [id number] 02/17/20 HNM    Staphylococcus lugdunensis NOT DETECTED NOT DETECTED Final   Streptococcus species DETECTED (A) NOT DETECTED Final    Comment: Not Enterococcus species, Streptococcus agalactiae, Streptococcus pyogenes, or Streptococcus pneumoniae. CRITICAL RESULT CALLED TO, READ BACK BY AND VERIFIED WITH: NATHAN BELUE PHARMD [id number] 02/17/20 HNM    Streptococcus agalactiae NOT DETECTED NOT DETECTED Final   Streptococcus pneumoniae NOT DETECTED NOT DETECTED Final   Streptococcus pyogenes NOT DETECTED NOT DETECTED Final   A.calcoaceticus-baumannii NOT DETECTED NOT DETECTED Final   Bacteroides fragilis NOT DETECTED NOT DETECTED Final   Enterobacterales NOT DETECTED NOT DETECTED Final   Enterobacter cloacae complex NOT DETECTED NOT DETECTED Final    Escherichia coli NOT DETECTED NOT DETECTED Final   Klebsiella aerogenes NOT DETECTED NOT DETECTED Final   Klebsiella oxytoca NOT DETECTED NOT DETECTED Final   Klebsiella pneumoniae NOT DETECTED NOT DETECTED Final   Proteus species NOT DETECTED NOT DETECTED Final   Salmonella species NOT DETECTED NOT DETECTED Final   Serratia marcescens NOT DETECTED NOT DETECTED Final   Haemophilus influenzae NOT DETECTED NOT DETECTED Final   Neisseria  meningitidis NOT DETECTED NOT DETECTED Final   Pseudomonas aeruginosa NOT DETECTED NOT DETECTED Final   Stenotrophomonas maltophilia NOT DETECTED NOT DETECTED Final   Candida albicans NOT DETECTED NOT DETECTED Final   Candida auris NOT DETECTED NOT DETECTED Final   Candida glabrata NOT DETECTED NOT DETECTED Final   Candida krusei NOT DETECTED NOT DETECTED Final   Candida parapsilosis NOT DETECTED NOT DETECTED Final   Candida tropicalis NOT DETECTED NOT DETECTED Final   Cryptococcus neoformans/gattii NOT DETECTED NOT DETECTED Final   Methicillin resistance mecA/C DETECTED (A) NOT DETECTED Final    Comment: CRITICAL RESULT CALLED TO, READ BACK BY AND VERIFIED WITHLloyd Huger PHARMD [id number] 02/17/20 HNM Performed at [facility], [address] [place], [place] [address]   Culture, blood (routine x 2)     Status: None   Collection Time: 02/16/20  2:38 PM   Specimen: BLOOD  Result Value Ref Range Status   Specimen Description BLOOD RIGHT ANTECUBITAL  Final   Special Requests   Final    BOTTLES DRAWN AEROBIC AND ANAEROBIC Blood Culture adequate volume   Culture   Final    NO GROWTH 5 DAYS Performed at [facility], [address] [place], [place] [address]    Report Status 02/21/2020 FINAL  Final  Resp Panel by RT-PCR (Flu A&B, Covid) Nasopharyngeal Swab     Status: None   Collection Time: 02/16/20  2:52 PM   Specimen: Nasopharyngeal Swab; Nasopharyngeal(NP) swabs in vial transport medium  Result Value Ref Range Status   SARS  Coronavirus 2 by RT PCR NEGATIVE NEGATIVE Final    Comment: (NOTE) SARS-CoV-2 target nucleic acids are NOT DETECTED.  The SARS-CoV-2 RNA is generally detectable in upper respiratory specimens during the acute phase of infection. The lowest concentration of SARS-CoV-2 viral copies this assay can detect is 138 copies/mL. A negative result does not preclude SARS-Cov-2 infection and should not be used as the sole basis for treatment or other patient management decisions. A negative result may occur with  improper specimen collection/handling, submission of specimen other than nasopharyngeal swab, presence of viral mutation(s) within the areas targeted by this assay, and inadequate number of viral copies(<138 copies/mL). A negative result must be combined with clinical observations, patient history, and epidemiological information. The expected result is Negative.  Fact Sheet for Patients:  EntrepreneurPulse.com.au  Fact Sheet for Healthcare Providers:  IncredibleEmployment.be  This test is no t yet approved or cleared by the [place] FDA and  has been authorized for detection and/or diagnosis of SARS-CoV-2 by FDA under an Emergency Use Authorization (EUA). This EUA will remain  in effect (meaning this test can be used) for the duration of the COVID-19 declaration under Section 564(b)(1) of the Act, 21 U.S.C.section 360bbb-3(b)(1), unless the authorization is terminated  or revoked sooner.       Influenza A by PCR NEGATIVE NEGATIVE Final   Influenza B by PCR NEGATIVE NEGATIVE Final    Comment: (NOTE) The Xpert Xpress SARS-CoV-2/FLU/RSV plus assay is intended as an aid in the diagnosis of influenza from Nasopharyngeal swab specimens and should not be used as a sole basis for treatment. Nasal washings and aspirates are unacceptable for Xpert Xpress SARS-CoV-2/FLU/RSV testing.  Fact Sheet for  Patients: EntrepreneurPulse.com.au  Fact Sheet for Healthcare Providers: IncredibleEmployment.be  This test is not yet approved or cleared by the [place] FDA and has been authorized for detection and/or diagnosis of SARS-CoV-2 by FDA under an Emergency Use Authorization (EUA). This EUA  will remain in effect (meaning this test can be used) for the duration of the COVID-19 declaration under Section 564(b)(1) of the Act, 21 U.S.C. section 360bbb-3(b)(1), unless the authorization is terminated or revoked.  Performed at [facility], [address] [place], [place] [address]   CULTURE, BLOOD (ROUTINE X 2) w Reflex to ID Panel     Status: None   Collection Time: 02/17/20 12:19 PM   Specimen: BLOOD  Result Value Ref Range Status   Specimen Description BLOOD BLOOD RIGHT HAND  Final   Special Requests   Final    BOTTLES DRAWN AEROBIC AND ANAEROBIC Blood Culture adequate volume   Culture   Final    NO GROWTH 5 DAYS Performed at [facility], [address] [place], [place] [address]    Report Status 02/22/2020 FINAL  Final  CULTURE, BLOOD (ROUTINE X 2) w Reflex to ID Panel     Status: None   Collection Time: 02/17/20 12:22 PM   Specimen: BLOOD  Result Value Ref Range Status   Specimen Description BLOOD RIGHT ANTECUBITAL  Final   Special Requests   Final    BOTTLES DRAWN AEROBIC AND ANAEROBIC Blood Culture adequate volume   Culture   Final    NO GROWTH 5 DAYS Performed at [facility], [address], [place], [place] [address]    Report Status 02/22/2020 FINAL  Final  C Difficile Quick Screen w PCR reflex     Status: None   Collection Time: 02/18/20  9:19 AM   Specimen: STOOL  Result Value Ref Range Status   C Diff antigen NEGATIVE NEGATIVE Final   C Diff toxin NEGATIVE NEGATIVE Final   C Diff interpretation No C. difficile detected.  Final    Comment: Performed at [facility], [address] [place], [place] [address]  Urine Culture     Status: Abnormal   Collection Time: 02/18/20  1:12 PM   Specimen: Urine, Random  Result Value Ref Range Status   Specimen Description   Final    URINE, RANDOM Performed at [facility], [address], [place], [place] [address]    Special Requests   Final    NONE Performed at [facility], [address] [place], [place] [address]    Culture MULTIPLE SPECIES PRESENT, SUGGEST RECOLLECTION (A)  Final   Report Status 02/20/2020 FINAL  Final  MRSA PCR Screening     Status: Abnormal   Collection Time: 02/21/20 10:33 PM   Specimen: Nasopharyngeal  Result Value Ref Range Status   MRSA by PCR POSITIVE (A) NEGATIVE Final    Comment:        The GeneXpert MRSA Assay (FDA approved for NASAL specimens only), is one component of a comprehensive MRSA colonization surveillance program. It is not intended to diagnose MRSA infection nor to guide or monitor treatment for MRSA infections. RESULT CALLED TO, READ BACK BY AND VERIFIED WITH: MELISSA COBB 02/22/2020 0029 JG Performed at [facility], [address] [place], [place] [address]     Coagulation Studies: Recent Labs    02/22/20 0355 02/23/20 0430  LABPROT 37.3* 31.2*  INR 3.9* 3.1*    Urinalysis: No results for input(s): COLORURINE, LABSPEC, PHURINE, GLUCOSEU, HGBUR, BILIRUBINUR, KETONESUR, PROTEINUR, UROBILINOGEN, NITRITE, LEUKOCYTESUR in the last 72 hours.  Invalid input(s): APPERANCEUR    Imaging: DG Chest Port 1 View  Result Date: 02/21/2020 CLINICAL DATA:  Hypoxia. EXAM: PORTABLE CHEST 1 VIEW COMPARISON:  Radiograph 02/16/2020  FINDINGS: Marked cardiomegaly. Unchanged mediastinal contours. Left hemidiaphragm is partially obscured, possible small pleural effusions. Improved left upper lung opacity. Worsening atelectasis at the right lung base. No pulmonary edema. No pneumothorax. Stable osseous structures. IMPRESSION: 1. Marked cardiomegaly.  Possible small left pleural effusion. 2. Improved left upper lung opacity. Worsening right lung base atelectasis. Electronically Signed   By: Keith Rake M.D.   On: 02/21/2020 22:16     Medications:   . sodium chloride Stopped (02/22/20 0204)   . allopurinol  100 mg Oral Daily  . apixaban  2.5 mg Oral BID  . ascorbic acid  500 mg Oral Daily  . carvedilol  3.125 mg Oral BID WC  . Chlorhexidine Gluconate Cloth  6 each Topical Daily  . colchicine  0.3 mg Oral Daily  . ferrous sulfate  325 mg Oral Q breakfast  . FLUoxetine  30 mg Oral Daily  . midodrine  10 mg Oral TID WC  . mupirocin ointment  1 application Nasal BID  . pantoprazole  40 mg Oral BID  . potassium chloride  40 mEq Oral BID  . sodium bicarbonate  1,300 mg Oral BID  . sodium chloride flush  3 mL Intravenous Q12H  . thiothixene  1 mg Oral BID   sodium chloride, albuterol, lip balm, loratadine, nitroGLYCERIN, ondansetron **OR** ondansetron (ZOFRAN) IV, phenylephrine, sodium chloride flush  Assessment/ Plan:  [name] is a 65 y.o. 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 Acute kidney injury on chronic kidney disease stage IV with history of proteinuria: baseline creatinine of 3.88, GFR of 12 on 01/29/20.  Chronic Kidney Disease secondary to hypertension Acute kidney injury with uremia - concern for ATN from prerenal azotemia - After speaking to sister, [name], patient would not want dialysis long term. Family is moving towards comfort care.   2. Hypotension: with decompensated heart failure.   3. Hypokalemia:   -  potassium chloride   LOS: 7 Amen Staszak 2/15/20221:14 PM

## 2020-02-23 NOTE — Consult Note (Signed)
Consultation Note Date: 02/23/2020   Patient Name: Katelyn Lane  DOB: 1955-08-26  MRN: [id number]  Age / Sex: 65 y.o., female  PCP: Revelo, Elyse Jarvis, MD Referring Physician: Lorella Nimrod, MD  Reason for Consultation: Establishing goals of care Spoke with Dr. Reesa Chew who states she will not be renewing involuntary commitment which expires today, and family can make decisions for her.   HPI/Patient Profile: Katelyn Lane is a 65 y.o. 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  Patient had been at the motel for about 3 days and had not been eating or drinking, states she had been on the floor  Clinical Assessment and Goals of Care: Patient is resting in bed with covers over head. She does not answer questions. Sitter at bedside.  Patient has 3 siblings. Called sister [name] and [name] who conferenced in brother Remi. They state patient has a child who has mostly been estranged and is incarcerated at this time. They are unable to provide information on him.   They are not able to tell me about Nickey's status prior to admission.   We discussed her diagnoses, prognosis, GOC, EOL wishes disposition and options.  Created space and opportunity for patient  to explore thoughts and feelings regarding current medical information.   A detailed discussion was had today regarding advanced directives.  Concepts specific to code status, artifical feeding and hydration, IV antibiotics and rehospitalization were discussed.  The difference between an aggressive medical intervention path and a comfort care path was discussed.  Values and goals of care  important to patient and family were attempted to be elicited.  Discussed limitations of medical interventions to prolong quality of life in some situations and discussed the concept of human mortality.  They all state they have been kept updated and articulate well her health issues. They all agree they want to shift the focus to her comfort for what time she has left on this earth. They do not want any further life prolonging care. They would like hospice facility placement.     I completed a MOST form today through [place] with all 3 siblings on the phone. It was signed by Ramonia.  The patient's siblings outlined their wishes for the following treatment decisions:  Cardiopulmonary Resuscitation: Do Not Attempt Resuscitation (DNR/No CPR)  Medical Interventions: Comfort Measures: Keep clean, warm, and dry. Use medication by any route, positioning, wound care, and other measures to relieve pain and suffering. Use oxygen, suction and manual treatment of airway obstruction as needed for comfort. Do not transfer to the hospital unless comfort needs cannot be met in current location.  Antibiotics: No antibiotics (use other measures to relieve symptoms)  IV Fluids: No IV fluids (provide other measures to ensure comfort)  Feeding Tube: No feeding tube  SUMMARY OF RECOMMENDATIONS   Transfer to hospice facility.    Prognosis:   < 2 weeks      Primary Diagnoses: Present on Admission: . AKI (acute kidney injury) ([place]) . Acute metabolic encephalopathy . Alcoholic cirrhosis of liver with ascites ([place]) . Depression . History of substance abuse ([place]) . Rhabdomyolysis . Transaminitis . Chronic systolic CHF (congestive heart failure) ([place])   I have reviewed the medical record, interviewed the patient and family, and examined the patient. The following aspects are pertinent.  Past Medical History:  Diagnosis Date  . CHF (congestive heart failure) ([place])   . COVID-19   .  Hyperlipidemia   . Hypertension   . Renal disorder    Social History   Socioeconomic History  . Marital status: Single    Spouse name: Not on file  . Number of children: Not on file  . Years of education: Not on file  . Highest education level: Not on file  Occupational History  . Not on file  Tobacco Use  . Smoking status: Current Every Day Smoker  . Smokeless tobacco: Never Used  Substance and Sexual Activity  . Alcohol use: No    Alcohol/week: 0.0 standard drinks  . Drug use: Yes    Types: Cocaine    Comment: 3-4 days ago  . Sexual activity: Not on file  Other Topics Concern  . Not on file  Social History Narrative  . Not on file   Social Determinants of Health   Financial Resource Strain: Not on file  Food Insecurity: Not on file  Transportation Needs: Not on file  Physical Activity: Not on file  Stress: Not on file  Social Connections: Not on file   Family History  Problem Relation Age of Onset  . Breast cancer Neg Hx    Scheduled Meds: . allopurinol  100 mg Oral Daily  . apixaban  2.5 mg Oral BID  . ascorbic acid  500 mg Oral Daily  . carvedilol  3.125 mg Oral BID WC  . Chlorhexidine Gluconate Cloth  6 each Topical Daily  . colchicine  0.3 mg Oral Daily  . ferrous sulfate  325 mg Oral Q breakfast  . FLUoxetine  30 mg Oral Daily  . midodrine  10 mg Oral TID WC  . mupirocin ointment  1 application Nasal BID  . pantoprazole  40 mg Oral BID  . potassium chloride  40 mEq Oral BID  . sodium bicarbonate  1,300 mg Oral BID  . sodium chloride flush  3 mL Intravenous Q12H  . thiothixene  1 mg Oral BID   Continuous Infusions: . sodium chloride Stopped (02/22/20 0204)   PRN Meds:.sodium chloride, albuterol, lip balm, loratadine, nitroGLYCERIN, ondansetron **OR** ondansetron (ZOFRAN) IV, phenylephrine, sodium chloride flush Medications Prior to Admission:  Prior to Admission medications   Medication Sig Start Date End Date Taking? Authorizing Provider   albuterol (VENTOLIN HFA) 108 (90 Base) MCG/ACT inhaler Inhale 2 puffs into the lungs every 6 (six) hours as needed for wheezing or shortness of breath. 01/29/20   Dwyane Dee, MD  allopurinol (ZYLOPRIM) 100 MG tablet Take 1 tablet (100 mg total) by mouth daily. 06/06/19 07/27/19  Loletha Grayer, MD  allopurinol (ZYLOPRIM) 100 MG tablet Take 1 tablet (100 mg total) by mouth daily. 01/29/20 02/28/20  Dwyane Dee, MD  apixaban (ELIQUIS) 5 MG TABS tablet Take 1 tablet (5 mg total) by mouth 2 (two) times daily. 01/29/20   Dwyane Dee, MD  ascorbic acid (VITAMIN C)  500 MG tablet Take 500 mg by mouth daily.    [provider]  atorvastatin (LIPITOR) 20 MG tablet Take 1 tablet (20 mg total) by mouth daily at 6 PM. 05/03/19 08/01/19  Charlynne Cousins, MD  atorvastatin (LIPITOR) 20 MG tablet Take 1 tablet (20 mg total) by mouth daily at 6 PM. 01/29/20 04/28/20  Dwyane Dee, MD  bumetanide (BUMEX) 2 MG tablet Take 1 tablet (2 mg total) by mouth 2 (two) times daily. 01/29/20   Dwyane Dee, MD  ciprofloxacin (CIPRO) 250 MG tablet Take 1 tablet (250 mg total) by mouth daily with breakfast. For SBP prophylaxis 01/30/20   Dwyane Dee, MD  ferrous sulfate 325 (65 FE) MG tablet Take 1 tablet (325 mg total) by mouth daily with breakfast. 09/03/19 10/03/19  [name], MD  ferrous sulfate 325 (65 FE) MG tablet Take 1 tablet (325 mg total) by mouth daily with breakfast. 01/29/20 02/28/20  Dwyane Dee, MD  FLUoxetine (PROZAC) 10 MG capsule Take 3 capsules (30 mg total) by mouth daily. 09/03/19 10/03/19  [name], MD  FLUoxetine (PROZAC) 10 MG capsule Take 3 capsules (30 mg total) by mouth daily. 01/29/20 02/28/20  Dwyane Dee, MD  hydrocortisone cream 0.5 % Apply topically 3 (three) times daily as needed for itching. 01/29/20   Dwyane Dee, MD  loratadine (CLARITIN) 10 MG tablet Take 10 mg by mouth daily as needed for allergies.     [provider]  midodrine (PROAMATINE)  10 MG tablet Take 1 tablet (10 mg total) by mouth 3 (three) times daily with meals. 01/29/20   Dwyane Dee, MD  nitroGLYCERIN (NITROSTAT) 0.4 MG SL tablet Place 1 tablet (0.4 mg total) under the tongue every 5 (five) minutes as needed for chest pain. 05/03/19   Charlynne Cousins, MD  pantoprazole (PROTONIX) 40 MG tablet Take 1 tablet (40 mg total) by mouth 2 (two) times daily. 01/29/20   Dwyane Dee, MD  spironolactone (ALDACTONE) 25 MG tablet Take 1 tablet (25 mg total) by mouth daily. 01/30/20   Dwyane Dee, MD  thiothixene (NAVANE) 1 MG capsule Take 1 capsule (1 mg total) by mouth 2 (two) times daily. 09/02/19 10/02/19  [name], MD  thiothixene (NAVANE) 1 MG capsule Take 1 capsule (1 mg total) by mouth 2 (two) times daily. 01/29/20 02/28/20  Dwyane Dee, MD   Allergies  Allergen Reactions  . Penicillins Hives, Rash and Other (See Comments)    Did it involve swelling of the face/tongue/throat, SOB, or low BP? No Did it involve sudden or severe rash/hives, skin peeling, or any reaction on the inside of your mouth or nose? Yes Did you need to seek medical attention at a hospital or doctor's office? Yes When did it last happen? >25 years If all above answers are "NO", may proceed with cephalosporin use.   . Ace Inhibitors Nausea And Vomiting   Review of Systems  Unable to perform ROS   Physical Exam Constitutional:      Comments: Covers pulled over head. Sitter at bedside.      Vital Signs: BP 94/84   Pulse 91   Temp (!) 97.4 F (36.3 C) (Axillary)   Resp 17   Ht $R'5\' 2"'AI$  (1.575 m)   Wt 79.5 kg   SpO2 95%   BMI 32.06 kg/m  Pain Scale: 0-10   Pain Score: Asleep   SpO2: SpO2: 95 % O2 Device:SpO2: 95 % O2 Flow Rate: .O2 Flow Rate (L/min): 4 L/min  IO: Intake/output summary:   Intake/Output Summary (Last 24 hours) at 02/23/2020 1553 Last data filed at 02/23/2020 1400 Gross per 24 hour  Intake 850.66 ml  Output 735 ml  Net 115.66 ml    LBM: Last  BM Date: 02/23/20 Baseline Weight: Weight: 79.4 kg Most recent weight: Weight: 79.5 kg      Time In: 3:20 Time Out: 350 Time Total: 30 min Greater than 50%  of this time was spent counseling and coordinating care related to the above assessment and plan.  Signed by: Asencion Gowda, NP   Please contact Palliative Medicine Team phone at [phone] for questions and concerns.  For individual provider: See Shea Evans

## 2020-02-23 NOTE — Progress Notes (Signed)
Cross Cover Patient no longer getting attempts for diuresis or dialysis.  Milrinone infusion started to improve attempts for diuresis to bridge to dialysis therefore, it has been discontinued. Dr Danny Lawless agrees

## 2020-02-24 DIAGNOSIS — I5022 Chronic systolic (congestive) heart failure: Secondary | ICD-10-CM

## 2020-02-24 DIAGNOSIS — F1911 Other psychoactive substance abuse, in remission: Secondary | ICD-10-CM

## 2020-02-24 LAB — COMPREHENSIVE METABOLIC PANEL
ALT: 10 U/L (ref 0–44)
AST: 46 U/L — ABNORMAL HIGH (ref 15–41)
Albumin: 2.6 g/dL — ABNORMAL LOW (ref 3.5–5.0)
Alkaline Phosphatase: 116 U/L (ref 38–126)
Anion gap: 20 — ABNORMAL HIGH (ref 5–15)
BUN: 147 mg/dL — ABNORMAL HIGH (ref 8–23)
CO2: 24 mmol/L (ref 22–32)
Calcium: 9.5 mg/dL (ref 8.9–10.3)
Chloride: 96 mmol/L — ABNORMAL LOW (ref 98–111)
Creatinine, Ser: 7.06 mg/dL — ABNORMAL HIGH (ref 0.44–1.00)
GFR, Estimated: 6 mL/min — ABNORMAL LOW (ref >60)
Glucose, Bld: 81 mg/dL (ref 70–99)
Potassium: 4.5 mmol/L (ref 3.5–5.1)
Sodium: 140 mmol/L (ref 135–145)
Total Bilirubin: 2.2 mg/dL — ABNORMAL HIGH (ref 0.3–1.2)
Total Protein: 6.5 g/dL (ref 6.5–8.1)

## 2020-02-24 LAB — PROTIME-INR
INR: 2.5 — ABNORMAL HIGH (ref 0.8–1.2)
Prothrombin Time: 25.8 seconds — ABNORMAL HIGH (ref 11.4–15.2)

## 2020-02-24 MED ORDER — ACETAMINOPHEN 325 MG PO TABS: 650.0000 mg | ORAL_TABLET | Freq: Four times a day (QID) | ORAL | 0 refills | Status: AC | PRN

## 2020-02-24 MED ORDER — POLYVINYL ALCOHOL 1.4 % OP SOLN: 1.0000 [drp] | Freq: Four times a day (QID) | OPHTHALMIC | 0 refills | Status: AC | PRN

## 2020-02-24 MED ORDER — ACETAMINOPHEN 650 MG RE SUPP: 650.0000 mg | Freq: Four times a day (QID) | RECTAL | 0 refills | Status: AC | PRN

## 2020-02-24 MED ORDER — LORAZEPAM 2 MG/ML IJ SOLN: 1.0000 mg | INTRAMUSCULAR | 0 refills | Status: AC | PRN

## 2020-02-24 MED ORDER — NITROGLYCERIN 0.4 MG SL SUBL: 0.4000 mg | SUBLINGUAL_TABLET | SUBLINGUAL | 0 refills | Status: AC | PRN

## 2020-02-24 MED ORDER — HALOPERIDOL LACTATE 2 MG/ML PO CONC: 0.5000 mg | ORAL | 0 refills | Status: AC | PRN

## 2020-02-24 MED ORDER — DIPHENHYDRAMINE HCL 50 MG/ML IJ SOLN: 12.5000 mg | INTRAMUSCULAR | 0 refills | Status: AC | PRN

## 2020-02-24 MED ORDER — LORAZEPAM 2 MG/ML PO CONC: 1.0000 mg | ORAL | 0 refills | Status: AC | PRN

## 2020-02-24 MED ORDER — ONDANSETRON HCL 4 MG/2ML IJ SOLN: 4.0000 mg | Freq: Four times a day (QID) | INTRAMUSCULAR | 0 refills | Status: AC | PRN

## 2020-02-24 MED ORDER — GLYCOPYRROLATE 1 MG PO TABS: 1.0000 mg | ORAL_TABLET | ORAL | 0 refills | Status: AC | PRN

## 2020-02-24 MED ORDER — CARVEDILOL 3.125 MG PO TABS: 3.1250 mg | ORAL_TABLET | Freq: Two times a day (BID) | ORAL | 0 refills | Status: AC

## 2020-02-24 MED ORDER — MORPHINE SULFATE (CONCENTRATE) 10 MG/0.5ML PO SOLN: 5.0000 mg | ORAL | 0 refills | Status: AC | PRN

## 2020-02-24 MED ORDER — GLYCOPYRROLATE 0.2 MG/ML IJ SOLN: 0.2000 mg | INTRAMUSCULAR | 0 refills | Status: AC | PRN

## 2020-02-24 MED ORDER — HALOPERIDOL LACTATE 5 MG/ML IJ SOLN: 0.5000 mg | INTRAMUSCULAR | 0 refills | Status: AC | PRN

## 2020-02-24 MED ORDER — MORPHINE SULFATE (PF) 2 MG/ML IV SOLN: 1.0000 mg | INTRAVENOUS | 0 refills | Status: AC | PRN

## 2020-02-24 NOTE — Progress Notes (Signed)
This RN provided report to the nurse assuming care of the patient at the [facility]. Santiago Glad is the receiving nurse who received report. All outstanding questions resolved. PIVs and foley catheter in place. All belongings packed and in tow. EMS to arrive at bedside to transport patient to [facility].

## 2020-02-24 NOTE — Progress Notes (Signed)
[facility]  ROUNDING NOTE   Subjective:   Patient seen resting. Did not disturb Hospice has joined care team   Objective:  Vital signs in last 24 hours:  Temp:  [93.7 F (34.3 C)-98.2 F (36.8 C)] 98.2 F (36.8 C) (02/16 1129) Pulse Rate:  [54-117] 54 (02/16 1129) Resp:  [13-28] 18 (02/16 1129) BP: (84-122)/(58-97) 105/82 (02/16 1129) SpO2:  [91 %-100 %] 100 % (02/16 1129) Weight:  [80.3 kg] 80.3 kg (02/15 2101)  Weight change:  Filed Weights   02/21/20 0345 02/21/20 2216 02/23/20 2101  Weight: 82.5 kg 79.5 kg 80.3 kg    Intake/Output: I/O last 3 completed shifts: In: 829.3 [P.O.:745; I.V.:84.3] Out: 800 [Urine:800]   Intake/Output this shift:  Total I/O In: 120 [P.O.:120] Out: -   Physical Exam: General: Laying in bed, lethargic  Head: +nasal packing  Eyes: Anicteric, PERRL  Neck: Supple, trachea midline  Lungs:  Bilateral crackles  Heart: Regular rate and rhythm  Abdomen:  Soft, nontender,   Extremities: + peripheral edema.  Neurologic: Alert to self only  Skin: No lesions  Access: none    Basic Metabolic Panel: Recent Labs  Lab 02/19/20 0406 02/20/20 0403 02/21/20 0413 02/22/20 0355 02/23/20 0430 02/24/20 0450  NA 140 141 139 140 141 140  K 3.5 3.3* 3.3* 3.5 3.0* 4.5  CL 97* 95* 96* 97* 97* 96*  CO2 23 21* 21* '24 26 24  '$ GLUCOSE 102* 72 83 127* 107* 81  BUN 131* 127* 137* 145* 155* 147*  CREATININE 6.67* 6.58* 6.67* 7.05* 6.99* 7.06*  CALCIUM 9.3 9.6 9.5 9.5 9.4 9.5  PHOS 5.8* 6.1* 6.3*  --   --   --     Liver Function Tests: Recent Labs  Lab 02/18/20 0556 02/19/20 0406 02/20/20 0403 02/21/20 0413 02/22/20 0355 02/23/20 0430 02/24/20 0450  AST 121*  --  83*  --  74* 52* 46*  ALT 61*  --  47*  --  '27 14 10  '$ ALKPHOS 131*  --  138*  --  145* 128* 116  BILITOT 2.5*  --  2.6*  --  2.4* 2.6* 2.2*  PROT 6.6  --  6.6  --  6.6 6.2* 6.5  ALBUMIN 2.6*   < > 2.6*  2.5* 2.6* 2.8* 2.5* 2.6*   < > = values in this interval not  displayed.   No results for input(s): LIPASE, AMYLASE in the last 168 hours. Recent Labs  Lab 02/20/20 0403  AMMONIA 28    CBC: Recent Labs  Lab 02/19/20 0406 02/20/20 0403 02/21/20 0413 02/22/20 0355 02/23/20 0430  WBC 4.8 3.9* 3.6* 4.3 4.9  HGB 10.9* 11.4* 11.4* 10.3* 10.1*  HCT 33.7* 35.3* 33.7* 31.4* 29.5*  MCV 68.1* 68.3* 66.3* 68.1* 67.0*  PLT 120* 122* 129* 121* 128*    Cardiac Enzymes: Recent Labs  Lab 02/18/20 0556  CKTOTAL 447*    BNP: Invalid input(s): POCBNP  CBG: Recent Labs  Lab 02/22/20 2334 02/23/20 0329 02/23/20 0736 02/23/20 1124 02/23/20 1621  GLUCAP 113* 83 97 120* 144*    Microbiology: Results for orders placed or performed during the hospital encounter of 02/16/20  Culture, blood (routine x 2)     Status: Abnormal   Collection Time: 02/16/20  2:28 PM   Specimen: BLOOD  Result Value Ref Range Status   Specimen Description   Final    BLOOD BLOOD LEFT HAND Performed at [facility], [address] [place], [place] [address]  Special Requests   Final    BOTTLES DRAWN AEROBIC AND ANAEROBIC Blood Culture adequate volume Performed at [facility], [address] [place], [place] [address]    Culture  Setup Time   Final    GRAM POSITIVE COCCI AEROBIC BOTTLE ONLY CRITICAL RESULT CALLED TO, READ BACK BY AND VERIFIED WITH: NATHAN BELUE PHARMD [id number] 02/17/20 HNM    Culture (A)  Final    STREPTOCOCCUS MITIS/ORALIS STAPHYLOCOCCUS EPIDERMIDIS THE SIGNIFICANCE OF ISOLATING THIS ORGANISM FROM A SINGLE SET OF BLOOD CULTURES WHEN MULTIPLE SETS ARE DRAWN IS UNCERTAIN. PLEASE NOTIFY THE MICROBIOLOGY DEPARTMENT WITHIN ONE WEEK IF SPECIATION AND SENSITIVITIES ARE REQUIRED. Performed at [facility], [place] [address] [place], [place] [address]    Report Status 02/19/2020 FINAL  Final  Blood Culture ID Panel (Reflexed)     Status: Abnormal   Collection Time: 02/16/20  2:28 PM  Result Value Ref Range Status   Enterococcus  faecalis NOT DETECTED NOT DETECTED Final   Enterococcus Faecium NOT DETECTED NOT DETECTED Final   Listeria monocytogenes NOT DETECTED NOT DETECTED Final   Staphylococcus species DETECTED (A) NOT DETECTED Final    Comment: CRITICAL RESULT CALLED TO, READ BACK BY AND VERIFIED WITHLloyd Huger PHARMD [id number] 02/17/20 HNM    Staphylococcus aureus (BCID) NOT DETECTED NOT DETECTED Final   Staphylococcus epidermidis DETECTED (A) NOT DETECTED Final    Comment: Methicillin (oxacillin) resistant coagulase negative staphylococcus. Possible blood culture contaminant (unless isolated from more than one blood culture draw or clinical case suggests pathogenicity). No antibiotic treatment is indicated for blood  culture contaminants. CRITICAL RESULT CALLED TO, READ BACK BY AND VERIFIED WITH: Lloyd Huger PHARMD [id number] 02/17/20 HNM    Staphylococcus lugdunensis NOT DETECTED NOT DETECTED Final   Streptococcus species DETECTED (A) NOT DETECTED Final    Comment: Not Enterococcus species, Streptococcus agalactiae, Streptococcus pyogenes, or Streptococcus pneumoniae. CRITICAL RESULT CALLED TO, READ BACK BY AND VERIFIED WITH: NATHAN BELUE PHARMD [id number] 02/17/20 HNM    Streptococcus agalactiae NOT DETECTED NOT DETECTED Final   Streptococcus pneumoniae NOT DETECTED NOT DETECTED Final   Streptococcus pyogenes NOT DETECTED NOT DETECTED Final   A.calcoaceticus-baumannii NOT DETECTED NOT DETECTED Final   Bacteroides fragilis NOT DETECTED NOT DETECTED Final   Enterobacterales NOT DETECTED NOT DETECTED Final   Enterobacter cloacae complex NOT DETECTED NOT DETECTED Final   Escherichia coli NOT DETECTED NOT DETECTED Final   Klebsiella aerogenes NOT DETECTED NOT DETECTED Final   Klebsiella oxytoca NOT DETECTED NOT DETECTED Final   Klebsiella pneumoniae NOT DETECTED NOT DETECTED Final   Proteus species NOT DETECTED NOT DETECTED Final   Salmonella species NOT DETECTED NOT DETECTED Final   Serratia marcescens NOT DETECTED NOT  DETECTED Final   Haemophilus influenzae NOT DETECTED NOT DETECTED Final   Neisseria meningitidis NOT DETECTED NOT DETECTED Final   Pseudomonas aeruginosa NOT DETECTED NOT DETECTED Final   Stenotrophomonas maltophilia NOT DETECTED NOT DETECTED Final   Candida albicans NOT DETECTED NOT DETECTED Final   Candida auris NOT DETECTED NOT DETECTED Final   Candida glabrata NOT DETECTED NOT DETECTED Final   Candida krusei NOT DETECTED NOT DETECTED Final   Candida parapsilosis NOT DETECTED NOT DETECTED Final   Candida tropicalis NOT DETECTED NOT DETECTED Final   Cryptococcus neoformans/gattii NOT DETECTED NOT DETECTED Final   Methicillin resistance mecA/C DETECTED (A) NOT DETECTED Final    Comment: CRITICAL RESULT CALLED TO, READ BACK BY AND VERIFIED WITHLloyd Huger PHARMD [id number] 02/17/20 HNM Performed at [facility], [address] [place], [place] [address]   Culture, blood (routine x 2)     Status: None   Collection Time: 02/16/20  2:38 PM   Specimen: BLOOD  Result Value Ref Range Status   Specimen Description BLOOD RIGHT ANTECUBITAL  Final   Special Requests   Final    BOTTLES DRAWN AEROBIC AND ANAEROBIC Blood Culture adequate volume   Culture   Final    NO GROWTH 5 DAYS Performed at [facility], [address] [place], [place] [address]    Report Status 02/21/2020 FINAL  Final  Resp Panel by RT-PCR (Flu A&B, Covid) Nasopharyngeal Swab     Status: None   Collection Time: 02/16/20  2:52 PM   Specimen: Nasopharyngeal Swab; Nasopharyngeal(NP) swabs in vial transport medium  Result Value Ref Range Status   SARS Coronavirus 2 by RT PCR NEGATIVE NEGATIVE Final    Comment: (NOTE) SARS-CoV-2 target nucleic acids are NOT DETECTED.  The SARS-CoV-2 RNA is generally detectable in upper respiratory specimens during the acute phase of infection. The lowest concentration of SARS-CoV-2 viral copies this assay can detect is 138 copies/mL. A negative result does not preclude  SARS-Cov-2 infection and should not be used as the sole basis for treatment or other patient management decisions. A negative result may occur with  improper specimen collection/handling, submission of specimen other than nasopharyngeal swab, presence of viral mutation(s) within the areas targeted by this assay, and inadequate number of viral copies(<138 copies/mL). A negative result must be combined with clinical observations, patient history, and epidemiological information. The expected result is Negative.  Fact Sheet for Patients:  EntrepreneurPulse.com.au  Fact Sheet for Healthcare Providers:  IncredibleEmployment.be  This test is no t yet approved or cleared by the [place] FDA and  has been authorized for detection and/or diagnosis of SARS-CoV-2 by FDA under an Emergency Use Authorization (EUA). This EUA will remain  in effect (meaning this test can be used) for the duration of the COVID-19 declaration under Section 564(b)(1) of the Act, 21 U.S.C.section 360bbb-3(b)(1), unless the authorization is terminated  or revoked sooner.       Influenza A by PCR NEGATIVE NEGATIVE Final   Influenza B by PCR NEGATIVE NEGATIVE Final    Comment: (NOTE) The Xpert Xpress SARS-CoV-2/FLU/RSV plus assay is intended as an aid in the diagnosis of influenza from Nasopharyngeal swab specimens and should not be used as a sole basis for treatment. Nasal washings and aspirates are unacceptable for Xpert Xpress SARS-CoV-2/FLU/RSV testing.  Fact Sheet for Patients: EntrepreneurPulse.com.au  Fact Sheet for Healthcare Providers: IncredibleEmployment.be  This test is not yet approved or cleared by the [place] FDA and has been authorized for detection and/or diagnosis of SARS-CoV-2 by FDA under an Emergency Use Authorization (EUA). This EUA will remain in effect (meaning this test can be used) for the duration of  the COVID-19 declaration under Section 564(b)(1) of the Act, 21 U.S.C. section 360bbb-3(b)(1), unless the authorization is terminated or revoked.  Performed at [facility], [address] [place], [place] [address]   CULTURE, BLOOD (ROUTINE X 2) w Reflex to ID Panel     Status: None   Collection Time: 02/17/20 12:19 PM   Specimen: BLOOD  Result Value Ref Range Status   Specimen Description BLOOD BLOOD RIGHT HAND  Final   Special Requests   Final    BOTTLES DRAWN AEROBIC AND ANAEROBIC Blood Culture adequate volume   Culture   Final    NO  GROWTH 5 DAYS Performed at [facility], [address] [place], [place] [address]    Report Status 02/22/2020 FINAL  Final  CULTURE, BLOOD (ROUTINE X 2) w Reflex to ID Panel     Status: None   Collection Time: 02/17/20 12:22 PM   Specimen: BLOOD  Result Value Ref Range Status   Specimen Description BLOOD RIGHT ANTECUBITAL  Final   Special Requests   Final    BOTTLES DRAWN AEROBIC AND ANAEROBIC Blood Culture adequate volume   Culture   Final    NO GROWTH 5 DAYS Performed at [facility], [address] [place], [place] [address]    Report Status 02/22/2020 FINAL  Final  C Difficile Quick Screen w PCR reflex     Status: None   Collection Time: 02/18/20  9:19 AM   Specimen: STOOL  Result Value Ref Range Status   C Diff antigen NEGATIVE NEGATIVE Final   C Diff toxin NEGATIVE NEGATIVE Final   C Diff interpretation No C. difficile detected.  Final    Comment: Performed at [facility], [address] [place], [place] [address]  Urine Culture     Status: Abnormal   Collection Time: 02/18/20  1:12 PM   Specimen: Urine, Random  Result Value Ref Range Status   Specimen Description   Final    URINE, RANDOM Performed at [facility], [address], [place], [place] [address]    Special Requests   Final    NONE Performed at [facility], [address] [place], [place] [address]     Culture MULTIPLE SPECIES PRESENT, SUGGEST RECOLLECTION (A)  Final   Report Status 02/20/2020 FINAL  Final  MRSA PCR Screening     Status: Abnormal   Collection Time: 02/21/20 10:33 PM   Specimen: Nasopharyngeal  Result Value Ref Range Status   MRSA by PCR POSITIVE (A) NEGATIVE Final    Comment:        The GeneXpert MRSA Assay (FDA approved for NASAL specimens only), is one component of a comprehensive MRSA colonization surveillance program. It is not intended to diagnose MRSA infection nor to guide or monitor treatment for MRSA infections. RESULT CALLED TO, READ BACK BY AND VERIFIED WITH: MELISSA COBB 02/22/2020 0029 JG Performed at [facility], [address] [place], [place] [address]     Coagulation Studies: Recent Labs    02/22/20 0355 02/23/20 0430 02/24/20 0450  LABPROT 37.3* 31.2* 25.8*  INR 3.9* 3.1* 2.5*    Urinalysis: No results for input(s): COLORURINE, LABSPEC, PHURINE, GLUCOSEU, HGBUR, BILIRUBINUR, KETONESUR, PROTEINUR, UROBILINOGEN, NITRITE, LEUKOCYTESUR in the last 72 hours.  Invalid input(s): APPERANCEUR    Imaging: No results found.   Medications:   . sodium chloride Stopped (02/22/20 0204)   . allopurinol  100 mg Oral Daily  . apixaban  2.5 mg Oral BID  . carvedilol  3.125 mg Oral BID WC  . Chlorhexidine Gluconate Cloth  6 each Topical Daily  . colchicine  0.3 mg Oral Daily  . ferrous sulfate  325 mg Oral Q breakfast  . FLUoxetine  30 mg Oral Daily  . midodrine  10 mg Oral TID WC  . mupirocin ointment  1 application Nasal BID  . pantoprazole  40 mg Oral BID  . sodium bicarbonate  1,300 mg Oral BID  . sodium chloride flush  3 mL Intravenous Q12H  . thiothixene  1 mg Oral BID   sodium chloride, acetaminophen **OR** acetaminophen, albuterol, diphenhydrAMINE, glycopyrrolate **  OR** glycopyrrolate **OR** glycopyrrolate, haloperidol **OR** haloperidol **OR** haloperidol lactate, HYDROmorphone (DILAUDID) injection, lip balm,  loratadine, LORazepam, LORazepam **OR** LORazepam **OR** LORazepam, LORazepam, morphine injection, morphine CONCENTRATE **OR** morphine CONCENTRATE, nitroGLYCERIN, ondansetron **OR** ondansetron (ZOFRAN) IV, oxyCODONE, polyvinyl alcohol, sodium chloride flush  Assessment/ Plan:  Katelyn Lane is a 65 y.o. 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 Acute kidney injury on chronic kidney disease stage IV with history of proteinuria: baseline creatinine of 3.88, GFR of 12 on 01/29/20.  Chronic Kidney Disease secondary to hypertension Acute kidney injury with uremia - concern for ATN from prerenal azotemia -patient currently awaiting a Hospice place bed. -Will signoff at this time  2. Hypotension: with decompensated heart failure.   3. Hypokalemia:   - potassium chloride   LOS: 8 Shantelle Breeze 2/16/202212:54 PM

## 2020-02-24 NOTE — Discharge Summary (Signed)
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 resistance mecA/C DETECTED (A) NOT DETECTED Final    Comment: CRITICAL RESULT CALLED TO, READ BACK BY AND VERIFIED WITHLloyd Huger PHARMD [id number] 02/17/20 [place] Performed at [facility], [address] [place], [place] [address]   Culture, blood (routine x 2)     Status: None   Collection Time: 02/16/20  2:38 PM   Specimen: BLOOD  Result Value Ref Range Status   Specimen Description BLOOD RIGHT ANTECUBITAL  Final   Special Requests   Final    BOTTLES DRAWN AEROBIC AND ANAEROBIC Blood Culture adequate volume   Culture   Final    NO GROWTH 5 DAYS Performed at [facility], [address] [place], [place] [address]    Report Status 02/21/2020 FINAL  Final  Resp Panel by RT-PCR (Flu A&B, Covid) Nasopharyngeal Swab     Status: None   Collection Time: 02/16/20  2:52 PM   Specimen: Nasopharyngeal Swab; Nasopharyngeal(NP) swabs in vial transport medium  Result Value Ref Range Status   SARS Coronavirus 2 by  RT PCR NEGATIVE NEGATIVE Final    Comment: (NOTE) SARS-CoV-2 target nucleic acids are NOT DETECTED.  The SARS-CoV-2 RNA is generally detectable in upper respiratory specimens during the acute phase of infection. The lowest concentration of SARS-CoV-2 viral copies this assay can detect is 138 copies/mL. A negative result does not preclude SARS-Cov-2 infection and should not be used as the sole basis for treatment or other patient management decisions. A negative result may occur with  improper specimen collection/handling, submission of specimen other than nasopharyngeal swab, presence of viral mutation(s) within the areas targeted by this assay, and inadequate number of viral copies(<138 copies/mL). A negative result must be combined with clinical observations, patient history, and epidemiological information. The expected result is Negative.  Fact Sheet for Patients:  EntrepreneurPulse.com.au  Fact Sheet for Healthcare Providers:  IncredibleEmployment.be  This test is no t yet approved or cleared by the [place] FDA and  has been authorized for detection and/or diagnosis of SARS-CoV-2 by FDA under an Emergency Use Authorization (EUA). This EUA will remain  in effect (meaning this test can be used) for the duration of the COVID-19 declaration under Section 564(b)(1) of the Act, 21 U.S.C.section 360bbb-3(b)(1), unless the authorization is terminated  or revoked sooner.       Influenza A by PCR NEGATIVE NEGATIVE Final   Influenza B by PCR NEGATIVE NEGATIVE Final    Comment: (NOTE) The Xpert Xpress SARS-CoV-2/FLU/RSV plus assay is intended as an aid in the diagnosis of influenza from Nasopharyngeal swab specimens and should not be used as a sole basis for treatment. Nasal washings and aspirates are unacceptable for Xpert Xpress SARS-CoV-2/FLU/RSV testing.  Fact Sheet for Patients: EntrepreneurPulse.com.au  Fact Sheet  for Healthcare Providers: IncredibleEmployment.be  This test is not yet approved or  cleared by the [place] and has been authorized for detection and/or diagnosis of SARS-CoV-2 by FDA under an Emergency Use Authorization (EUA). This EUA will remain in effect (meaning this test can be used) for the duration of the COVID-19 declaration under Section 564(b)(1) of the Act, 21 U.S.C. section 360bbb-3(b)(1), unless the authorization is terminated or revoked.  Performed at [facility], [address] [place], [place] [address]   CULTURE, BLOOD (ROUTINE X 2) w Reflex to ID Panel     Status: None   Collection Time: 02/17/20 12:19 PM   Specimen: BLOOD  Result Value Ref Range Status   Specimen Description BLOOD BLOOD RIGHT HAND  Final   Special Requests   Final    BOTTLES DRAWN AEROBIC AND ANAEROBIC Blood Culture adequate volume   Culture   Final    NO GROWTH 5 DAYS Performed at [facility], [address] [place], [place] [address]    Report Status 02/22/2020 FINAL  Final  CULTURE, BLOOD (ROUTINE X 2) w Reflex to ID Panel     Status: None   Collection Time: 02/17/20 12:22 PM   Specimen: BLOOD  Result Value Ref Range Status   Specimen Description BLOOD RIGHT ANTECUBITAL  Final   Special Requests   Final    BOTTLES DRAWN AEROBIC AND ANAEROBIC Blood Culture adequate volume   Culture   Final    NO GROWTH 5 DAYS Performed at [facility], [address], [place], [place] [address]    Report Status 02/22/2020 FINAL  Final  C Difficile Quick Screen w PCR reflex     Status: None   Collection Time: 02/18/20  9:19 AM   Specimen: STOOL  Result Value Ref Range Status   C Diff antigen NEGATIVE NEGATIVE Final   C Diff toxin NEGATIVE NEGATIVE Final   C Diff interpretation No C. difficile detected.  Final    Comment: Performed at [facility], [address] [place], [place] [address]  Urine Culture     Status: Abnormal    Collection Time: 02/18/20  1:12 PM   Specimen: Urine, Random  Result Value Ref Range Status   Specimen Description   Final    URINE, RANDOM Performed at [facility], [address] [place], [place] [address]    Special Requests   Final    NONE Performed at [facility], [address] [place], [place] [address]    Culture MULTIPLE SPECIES PRESENT, SUGGEST RECOLLECTION (A)  Final   Report Status 02/20/2020 FINAL  Final  MRSA PCR Screening     Status: Abnormal   Collection Time: 02/21/20 10:33 PM   Specimen: Nasopharyngeal  Result Value Ref Range Status   MRSA by PCR POSITIVE (A) NEGATIVE Final    Comment:        The GeneXpert MRSA Assay (FDA approved for NASAL specimens only), is one component of a comprehensive MRSA colonization surveillance program. It is not intended to diagnose MRSA infection nor to guide or monitor treatment for MRSA infections. RESULT CALLED TO, READ BACK BY AND VERIFIED WITH: MELISSA COBB 02/22/2020 [place] Performed at [facility], [address] [place], [place] [address]      Labs: Basic Metabolic Panel: Recent Labs  Lab 02/19/20 0406 02/20/20 0403 02/21/20 0413 02/22/20 0355 02/23/20 0430 02/24/20 0450  NA 140 141 139 140 141 140  K 3.5 3.3* 3.3* 3.5 3.0* 4.5  CL 97* 95* 96* 97* 97* 96*  CO2  23 21* 21* '24 26 24  '$ GLUCOSE 102* 72 83 127* 107* 81  BUN 131* 127* 137* 145* 155* 147*  CREATININE 6.67* 6.58* 6.67* 7.05* 6.99* 7.06*  CALCIUM 9.3 9.6 9.5 9.5 9.4 9.5  PHOS 5.8* 6.1* 6.3*  --   --   --    Liver Function Tests: Recent Labs  Lab 02/18/20 0556 02/19/20 0406 02/20/20 0403 02/21/20 0413 02/22/20 0355 02/23/20 0430 02/24/20 0450  AST 121*  --  83*  --  74* 52* 46*  ALT 61*  --  47*  --  '27 14 10  '$ ALKPHOS 131*  --  138*  --  145* 128* 116  BILITOT 2.5*  --  2.6*  --  2.4* 2.6* 2.2*  PROT 6.6  --  6.6  --  6.6 6.2* 6.5  ALBUMIN 2.6*   < > 2.6*  2.5* 2.6* 2.8* 2.5* 2.6*   < > = values in  this interval not displayed.   No results for input(s): LIPASE, AMYLASE in the last 168 hours. Recent Labs  Lab 02/20/20 0403  AMMONIA 28   CBC: Recent Labs  Lab 02/19/20 0406 02/20/20 0403 02/21/20 0413 02/22/20 0355 02/23/20 0430  WBC 4.8 3.9* 3.6* 4.3 4.9  HGB 10.9* 11.4* 11.4* 10.3* 10.1*  HCT 33.7* 35.3* 33.7* 31.4* 29.5*  MCV 68.1* 68.3* 66.3* 68.1* 67.0*  PLT 120* 122* 129* 121* 128*   Cardiac Enzymes: Recent Labs  Lab 02/18/20 0556  CKTOTAL 447*   BNP: BNP (last 3 results) Recent Labs    08/27/19 1551 08/28/19 0940 10/19/19 0935  BNP 3,266.2* 3,245.7* >4,500.0*    ProBNP (last 3 results) No results for input(s): PROBNP in the last 8760 hours.  CBG: Recent Labs  Lab 02/22/20 2334 02/23/20 0329 02/23/20 0736 02/23/20 1124 02/23/20 1621  GLUCAP 113* 83 97 120* 144*       Signed:  Dia Crawford, MD [facility]

## 2020-02-24 NOTE — Progress Notes (Signed)
Transported via bed to Room 204.  Pt more alert and conversant at this time.  No distress noted.

## 2020-02-24 NOTE — TOC Transition Note (Signed)
Transition of Care [facility]) - CM/SW Discharge Note   Patient Details  Name: Katelyn Lane MRN: [id number] Date of Birth: [birth year]  Transition of Care [facility]) CM/SW Contact:  Candie Chroman, LCSW Phone Number: 02/24/2020, 12:36 PM   Clinical Narrative:  Patient has a bed at the [facility] today. MD is aware and will enter discharge. TOC coworker arranged transportation with Hospital doctor at 5:00. No further concerns. CSW signing off.   Final next level of care: [place] Barriers to Discharge: No Barriers Identified   Patient Goals and CMS Choice     Choice offered to / list presented to : Sibling  Discharge Placement                Patient to be transferred to facility by: [facility]   Patient and family notified of of transfer: 02/24/20  Discharge Plan and Services     Post Acute Care Choice: Hospice                               Social Determinants of Health (SDOH) Interventions     Readmission Risk Interventions Readmission Risk Prevention Plan 09/02/2019 08/14/2019 07/27/2019  Transportation Screening Complete Complete Complete  Medication Review [employer]) Complete Referral to Pharmacy -  PCP or Specialist appointment within 3-5 days of discharge Complete Complete Complete  HRI or Home Care Consult - Complete Complete  SW Recovery Care/Counseling Consult - Complete Complete  Palliative Care Screening Not Applicable Not Applicable Not Applicable  Skilled Nursing Facility Complete Not Applicable Not Applicable  Some recent data might be hidden

## 2020-02-24 NOTE — Progress Notes (Signed)
[place] Room [address] [facility]) Hospital Liaison RN note:  Received request from Asencion Gowda, NP and [place] Scrape, [facility] for family interest in [place]. Chart reviewed and eligibility has been approved. Spoke with sister, Jacqlyn Larsen over the phone to confirm interest and explain services.  Hospice Home does have a room to offer today. Hospital care team is aware. Brother, Arther Abbott will complete registration paperwork at the [facility] today at 1:30. Transportation can be arranged for 5 pm. I will fax the D/C summary once it is available.  Please call with any hospice related questions or concerns.  Thank you for the opportunity to participate in this patient's care.  Zandra Abts, RN [facility] Liaison [phone]

## 2020-02-24 NOTE — TOC Progression Note (Signed)
Transition of Care [facility]) - Progression Note    Patient Details  Name: Katelyn Lane MRN: [id number] Date of Birth: [birth year]  Transition of Care [facility]) CM/SW [place], LCSW Phone Number: 02/24/2020, 9:37 AM  Clinical Narrative:  CSW called patient's brother and confirmed plan for hospice house. Preference for [facility]. Referral made to Zandra Abts, RN with [facility].   Expected Discharge Plan: [place]    Expected Discharge Plan and Services Expected Discharge Plan: [place]     Post Acute Care Choice: Hospice                                         Social Determinants of Health (SDOH) Interventions    Readmission Risk Interventions Readmission Risk Prevention Plan 09/02/2019 08/14/2019 07/27/2019  Transportation Screening Complete Complete Complete  Medication Review Press photographer) Complete Referral to Pharmacy -  PCP or Specialist appointment within 3-5 days of discharge Complete Complete Complete  HRI or Home Care Consult - Complete Complete  SW Recovery Care/Counseling Consult - Complete Complete  Palliative Care Screening Not Applicable Not Applicable Not Applicable  Skilled Nursing Facility Complete Not Applicable Not Applicable  Some recent data might be hidden

## 2020-03-08 DEATH — deceased

## 2020-09-13 IMAGING — DX DG CHEST 1V PORT
1 series · 2 of 2 positions shown · non-contrast
Comparison: 01/15/2019

CLINICAL DATA: Dyspnea

EXAM:
PORTABLE CHEST 1 VIEW

[Series 1: chest ap · 0.14mm/px · 2 of 2 slices shown]
[im 1/2]
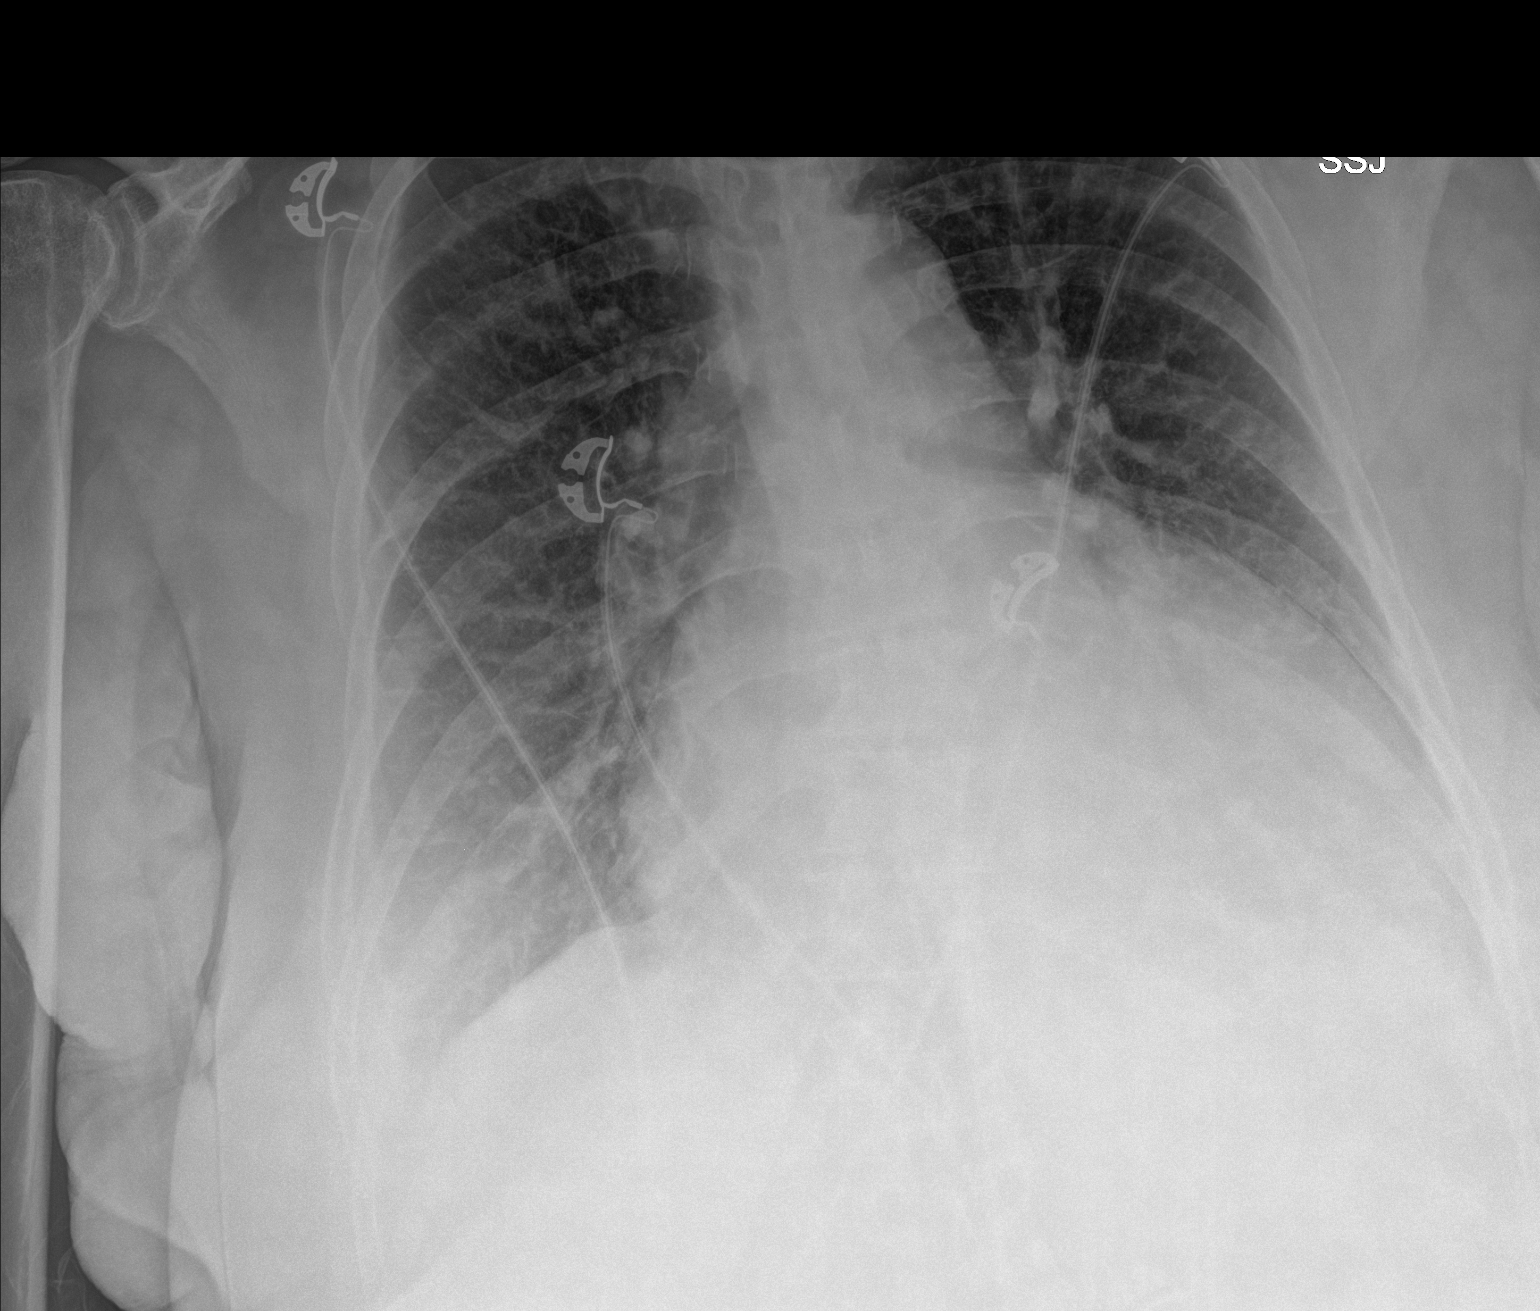
[im 2/2]
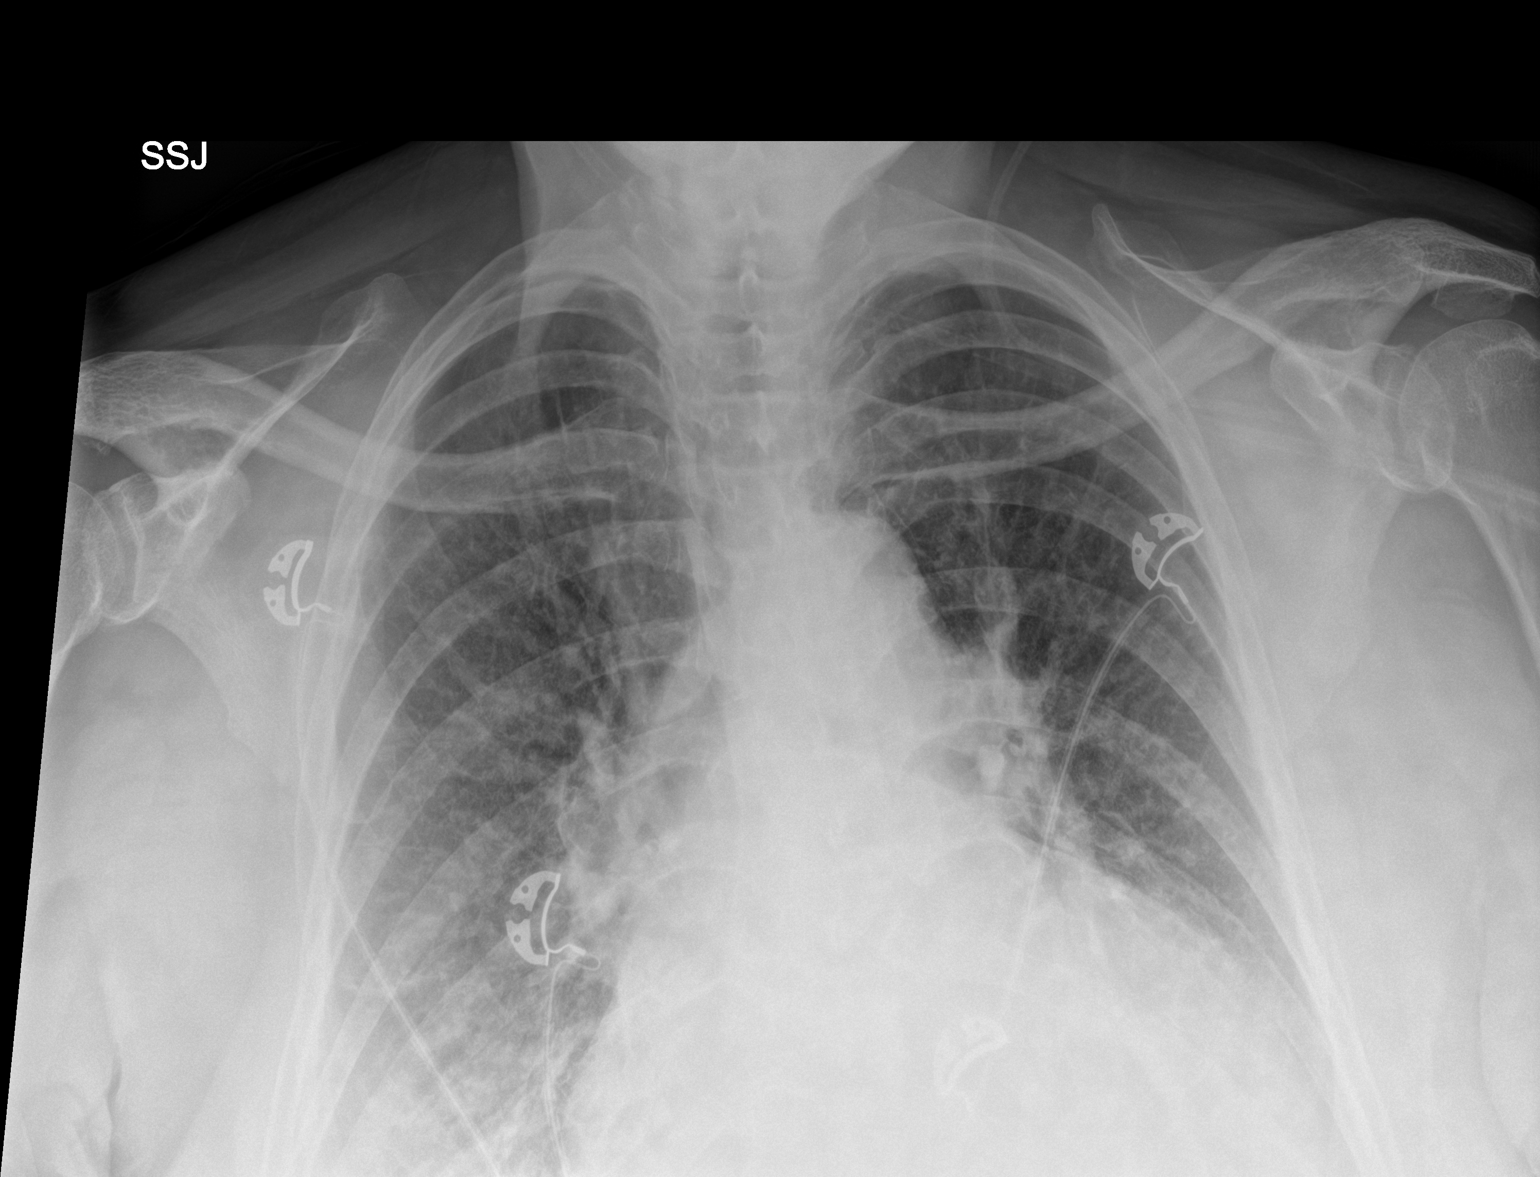

[2 of 2 positions shown; findings below may reference images not displayed]

FINDINGS: Cardiomegaly with mild interstitial edema and small right pleural
effusion.

Left lower lung opacity, favoring a combination of atelectasis and
small left pleural effusion, although retrocardiac pneumonia is
difficult to exclude.

No pneumothorax.
IMPRESSION: Cardiomegaly with mild interstitial edema and suspected small
bilateral pleural effusions.

Left lower lobe opacity, likely atelectasis, pneumonia not excluded.

## 2021-01-03 IMAGING — MR MR HEAD W/O CM
4 series · 45 of 48 positions shown · non-contrast
Comparison: Head CT 05/21/2019.  MRI 01/17/2019.

CLINICAL DATA: Altered mental status of unknown etiology. Heart
failure. Negative head CT 2 days ago.

EXAM:
MRI HEAD WITHOUT CONTRAST
TECHNIQUE: Multiplanar, multiecho pulse sequences of the brain and surrounding
structures were obtained without intravenous contrast.

[Series 5: ax dwi_tracew · axial · 3.0mm · 0.71mm/px · z∈[-30,+135]mm · 21 of 111 slices shown]
[im 1/111]
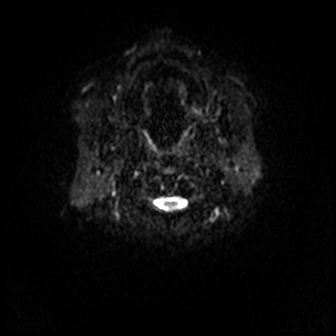
[im 6/111]
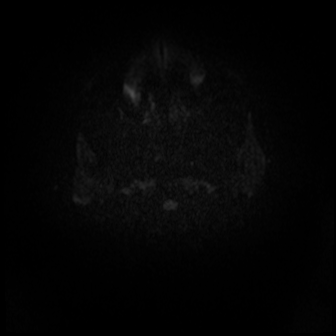
[im 12/111]
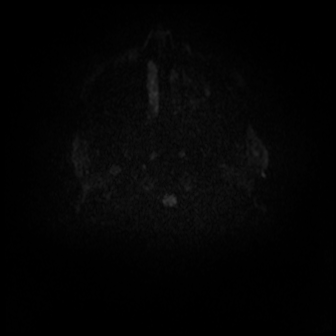
[im 17/111]
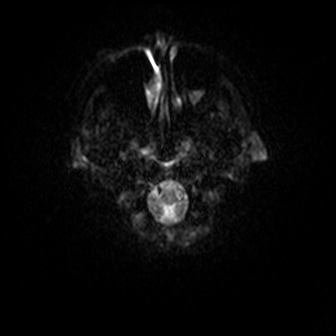
[im 23/111]
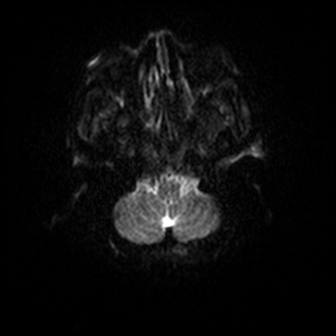
[im 28/111]
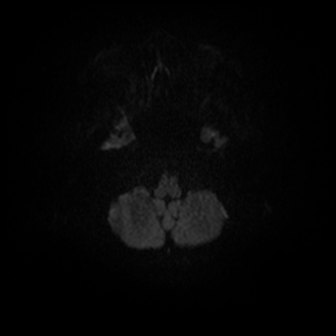
[im 34/111]
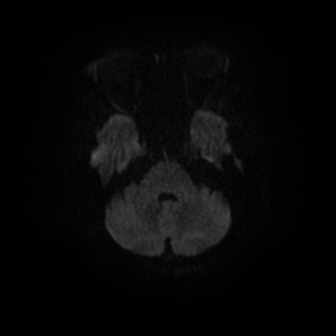
[im 39/111]
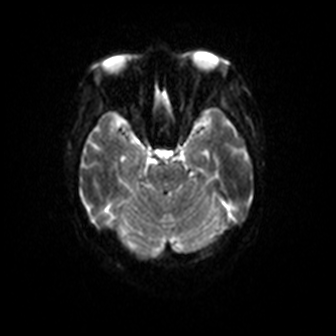
[im 45/111]
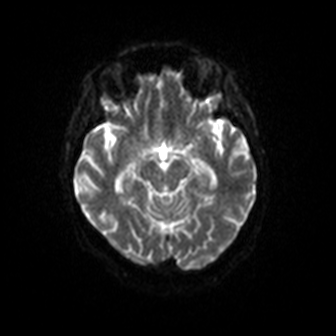
[im 50/111]
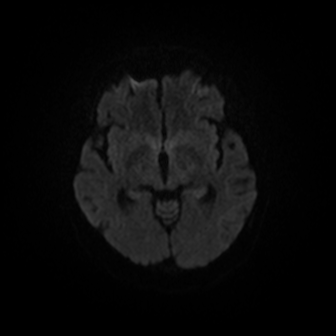
[im 56/111]
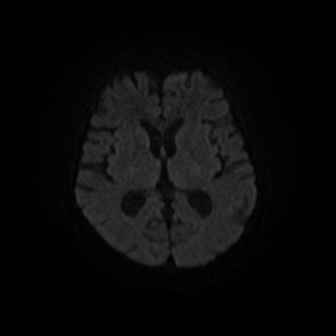
[im 61/111]
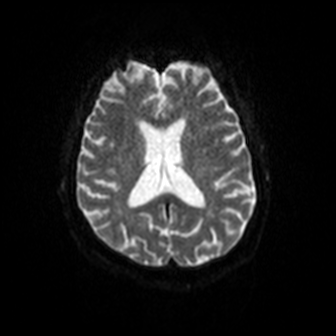
[im 67/111]
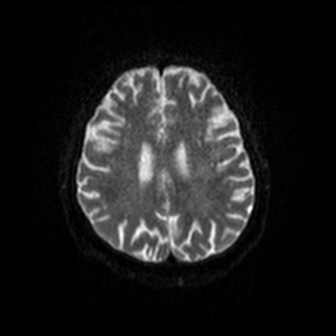
[im 72/111]
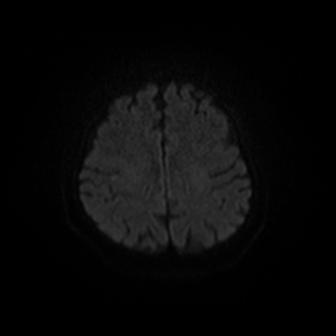
[im 78/111]
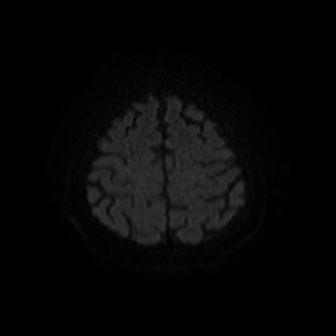
[im 83/111]
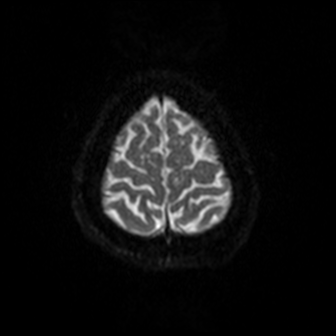
[im 89/111]
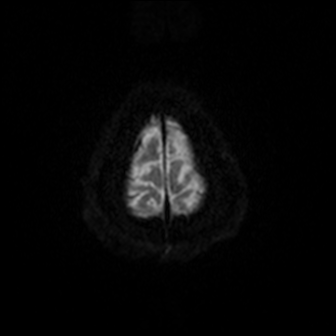
[im 94/111]
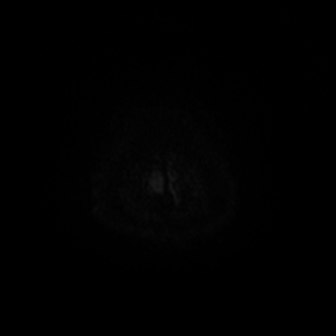
[im 100/111]
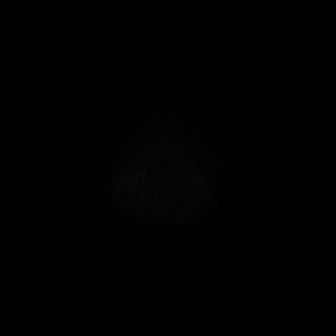
[im 105/111]
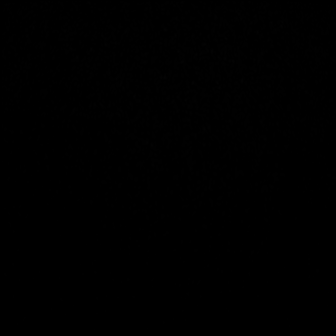
[im 111/111]
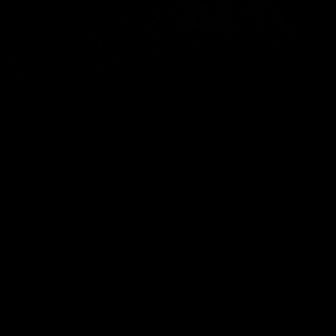

[Series 6: ax dwi_adc · axial · 3.0mm · 0.71mm/px · z∈[-30,+120]mm · 9 of 51 slices shown]
[im 1/51]
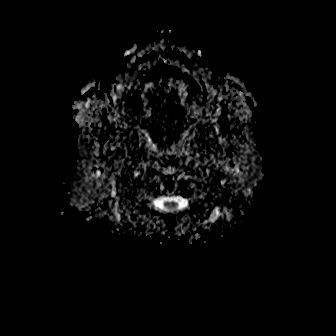
[im 7/51]
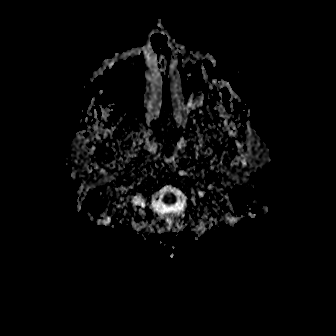
[im 13/51]
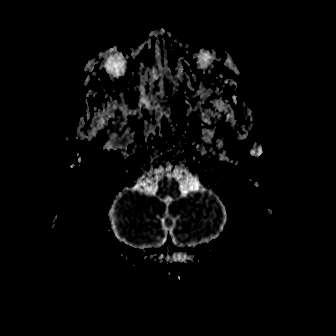
[im 19/51]
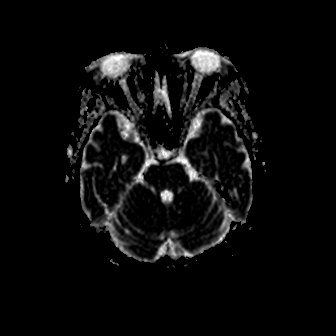
[im 26/51]
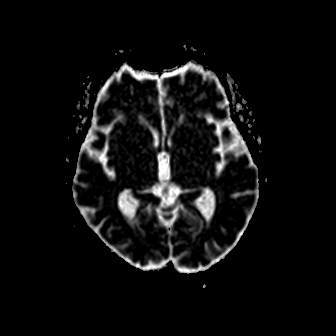
[im 32/51]
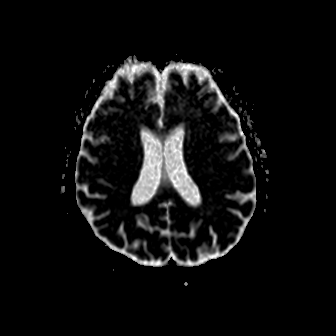
[im 38/51]
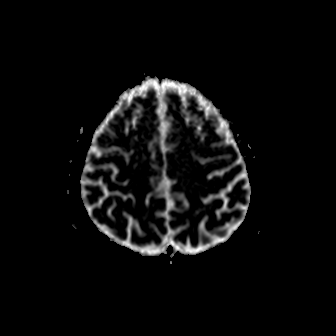
[im 44/51]
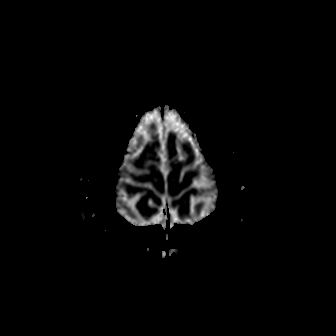
[im 51/51]
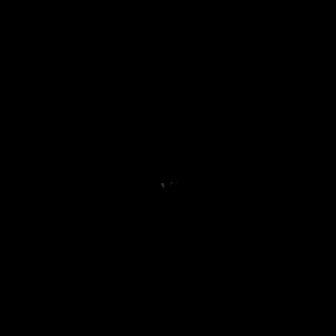

[Series 7: cor dwi_tracew · coronal · 5.0mm · 0.68mm/px · 9 of 68 slices shown]
[im 1/68]
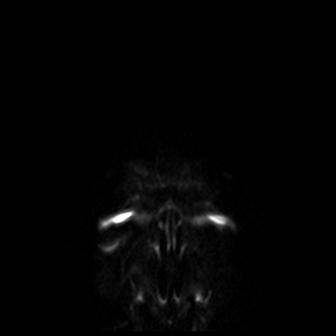
[im 13/68]
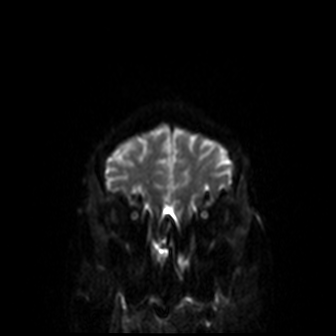
[im 19/68]
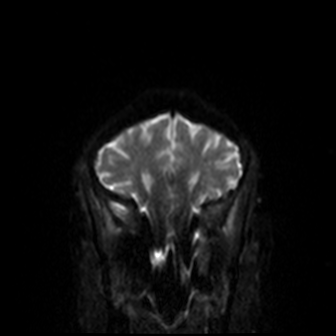
[im 31/68]
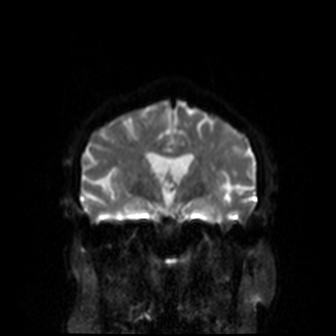
[im 37/68]
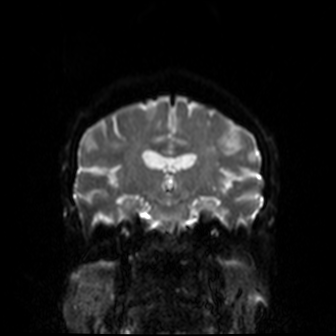
[im 49/68]
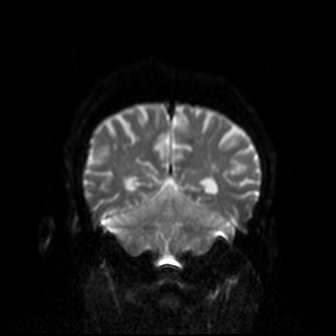
[im 55/68]
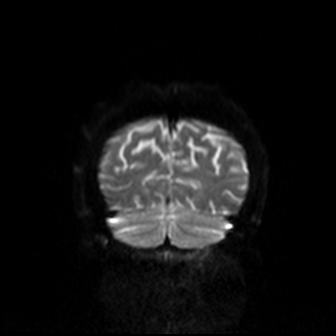
[im 61/68]
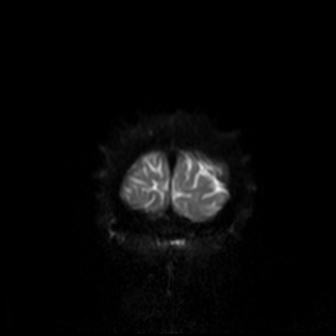
[im 68/68]
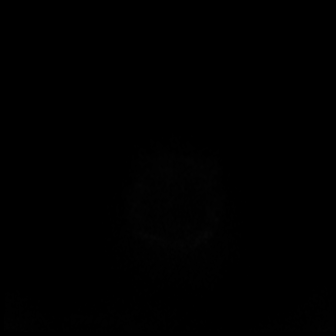

[Series 8: cor dwi_adc · coronal · 5.0mm · 0.68mm/px · 6 of 34 slices shown]
[im 1/34]
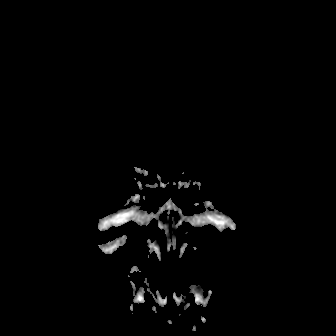
[im 7/34]
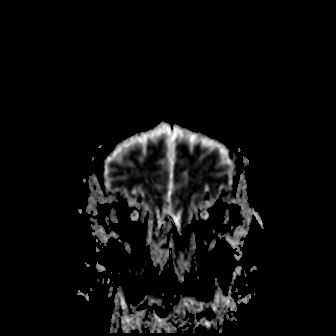
[im 14/34]
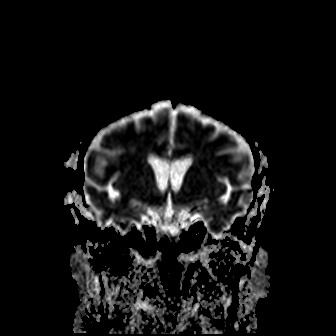
[im 20/34]
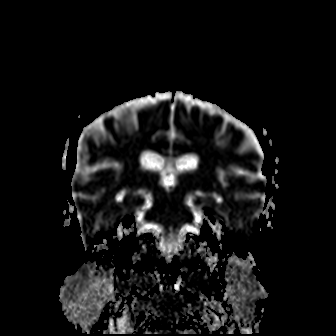
[im 27/34]
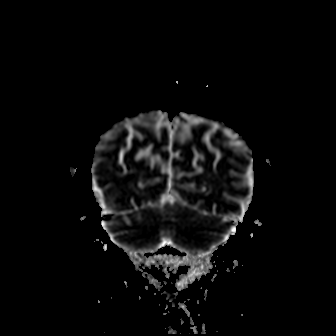
[im 34/34]
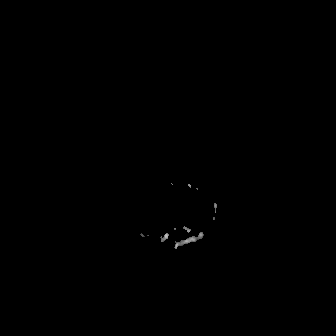

[45 of 48 positions shown; findings below may reference images not displayed]

FINDINGS: Brain: Diffusion imaging does not show any acute or subacute insult
affecting the brainstem, cerebellum or right hemisphere. Previously
seen acute infarction at the left frontoparietal junction in the
cortical and subcortical brain has lost its restricted diffusion as
expected, but there is a small area of new involvement just deep to
the previous acute infarction within the underlying white matter.
This is consistent with minor acute to subacute extension of the
previously seen insult. No new area of acute insult. Only diffusion
imaging was done as requested.

Vascular: Major vessels at the base of the brain show flow.

Skull and upper cervical spine: Negative

Sinuses/Orbits: Clear/normal

Other: None
IMPRESSION: In [REDACTED], the patient had an area of acute/subacute infarction at
the left frontoparietal junction cortical and subcortical brain.
That area has lost its restricted diffusion as expected with normal
evolutionary change. There is low level restricted diffusion now
seen in the immediately adjacent underlying subcortical white
matter, probably representing a minor extension of the insult.
